# Patient Record
Sex: Male | Born: 1970
Health system: Southern US, Community
[De-identification: ages and names within clinical notes are randomized; demographics above are authoritative.]

## PROBLEM LIST (undated history)

## (undated) ENCOUNTER — Emergency Department (HOSPITAL_COMMUNITY): Discharge status: Against Medical Advice | Payer: Medicare Other

## (undated) DIAGNOSIS — J189 Pneumonia, unspecified organism: Secondary | ICD-10-CM

## (undated) DIAGNOSIS — L97519 Non-pressure chronic ulcer of other part of right foot with unspecified severity: Secondary | ICD-10-CM

## (undated) DIAGNOSIS — E119 Type 2 diabetes mellitus without complications: Secondary | ICD-10-CM

## (undated) DIAGNOSIS — G4730 Sleep apnea, unspecified: Secondary | ICD-10-CM

## (undated) DIAGNOSIS — I82409 Acute embolism and thrombosis of unspecified deep veins of unspecified lower extremity: Secondary | ICD-10-CM

## (undated) DIAGNOSIS — I10 Essential (primary) hypertension: Secondary | ICD-10-CM

## (undated) DIAGNOSIS — F419 Anxiety disorder, unspecified: Secondary | ICD-10-CM

## (undated) DIAGNOSIS — K219 Gastro-esophageal reflux disease without esophagitis: Secondary | ICD-10-CM

## (undated) DIAGNOSIS — F32A Depression, unspecified: Secondary | ICD-10-CM

## (undated) DIAGNOSIS — J42 Unspecified chronic bronchitis: Secondary | ICD-10-CM

## (undated) DIAGNOSIS — M1990 Unspecified osteoarthritis, unspecified site: Secondary | ICD-10-CM

## (undated) DIAGNOSIS — I2510 Atherosclerotic heart disease of native coronary artery without angina pectoris: Secondary | ICD-10-CM

## (undated) DIAGNOSIS — I2699 Other pulmonary embolism without acute cor pulmonale: Secondary | ICD-10-CM

## (undated) DIAGNOSIS — E785 Hyperlipidemia, unspecified: Secondary | ICD-10-CM

## (undated) DIAGNOSIS — G629 Polyneuropathy, unspecified: Secondary | ICD-10-CM

## (undated) DIAGNOSIS — G43909 Migraine, unspecified, not intractable, without status migrainosus: Secondary | ICD-10-CM

## (undated) DIAGNOSIS — J449 Chronic obstructive pulmonary disease, unspecified: Secondary | ICD-10-CM

## (undated) DIAGNOSIS — I219 Acute myocardial infarction, unspecified: Secondary | ICD-10-CM

## (undated) DIAGNOSIS — F329 Major depressive disorder, single episode, unspecified: Secondary | ICD-10-CM

## (undated) DIAGNOSIS — R51 Headache: Secondary | ICD-10-CM

## (undated) DIAGNOSIS — J45909 Unspecified asthma, uncomplicated: Secondary | ICD-10-CM

## (undated) HISTORY — DX: Hyperlipidemia, unspecified: E78.5

## (undated) HISTORY — DX: Essential (primary) hypertension: I10

## (undated) HISTORY — DX: Type 2 diabetes mellitus without complications: E11.9

## (undated) HISTORY — DX: Morbid (severe) obesity due to excess calories: E66.01

## (undated) HISTORY — DX: Atherosclerotic heart disease of native coronary artery without angina pectoris: I25.10

## (undated) HISTORY — PX: KNEE ARTHROSCOPY: SHX127

## (undated) HISTORY — DX: Polyneuropathy, unspecified: G62.9

## (undated) HISTORY — DX: Migraine, unspecified, not intractable, without status migrainosus: G43.909

## (undated) HISTORY — PX: CARPAL TUNNEL RELEASE: SHX101

## (undated) HISTORY — DX: Acute embolism and thrombosis of unspecified deep veins of unspecified lower extremity: I82.409

## (undated) HISTORY — PX: SHOULDER OPEN ROTATOR CUFF REPAIR: SHX2407

---

## 1994-12-20 DIAGNOSIS — I219 Acute myocardial infarction, unspecified: Secondary | ICD-10-CM

## 1994-12-20 HISTORY — DX: Acute myocardial infarction, unspecified: I21.9

## 1994-12-20 HISTORY — PX: CARDIAC CATHETERIZATION: SHX172

## 1999-12-16 ENCOUNTER — Emergency Department (HOSPITAL_COMMUNITY): Admission: EM | Admit: 1999-12-16 | Discharge: 1999-12-16 | Payer: Self-pay

## 2000-10-22 ENCOUNTER — Emergency Department (HOSPITAL_COMMUNITY): Admission: EM | Admit: 2000-10-22 | Discharge: 2000-10-22 | Payer: Self-pay

## 2001-03-20 ENCOUNTER — Emergency Department (HOSPITAL_COMMUNITY): Admission: EM | Admit: 2001-03-20 | Discharge: 2001-03-21 | Payer: Self-pay | Admitting: Emergency Medicine

## 2001-03-21 ENCOUNTER — Encounter: Payer: Self-pay | Admitting: Emergency Medicine

## 2001-04-03 ENCOUNTER — Emergency Department (HOSPITAL_COMMUNITY): Admission: EM | Admit: 2001-04-03 | Discharge: 2001-04-03 | Payer: Self-pay | Admitting: Internal Medicine

## 2001-04-12 ENCOUNTER — Encounter: Admission: RE | Admit: 2001-04-12 | Discharge: 2001-04-12 | Payer: Self-pay | Admitting: Internal Medicine

## 2001-04-13 ENCOUNTER — Ambulatory Visit (HOSPITAL_COMMUNITY): Admission: RE | Admit: 2001-04-13 | Discharge: 2001-04-13 | Payer: Self-pay | Admitting: Internal Medicine

## 2001-04-20 ENCOUNTER — Ambulatory Visit (HOSPITAL_COMMUNITY): Admission: RE | Admit: 2001-04-20 | Discharge: 2001-04-20 | Payer: Self-pay | Admitting: Internal Medicine

## 2001-04-24 ENCOUNTER — Encounter: Admission: RE | Admit: 2001-04-24 | Discharge: 2001-07-23 | Payer: Self-pay | Admitting: *Deleted

## 2001-10-20 ENCOUNTER — Encounter: Payer: Self-pay | Admitting: Emergency Medicine

## 2001-10-20 ENCOUNTER — Emergency Department (HOSPITAL_COMMUNITY): Admission: EM | Admit: 2001-10-20 | Discharge: 2001-10-20 | Payer: Self-pay | Admitting: Emergency Medicine

## 2003-01-29 ENCOUNTER — Emergency Department (HOSPITAL_COMMUNITY): Admission: EM | Admit: 2003-01-29 | Discharge: 2003-01-29 | Payer: Self-pay | Admitting: Emergency Medicine

## 2003-01-29 ENCOUNTER — Encounter: Payer: Self-pay | Admitting: Emergency Medicine

## 2003-05-13 ENCOUNTER — Emergency Department (HOSPITAL_COMMUNITY): Admission: EM | Admit: 2003-05-13 | Discharge: 2003-05-13 | Payer: Self-pay | Admitting: Emergency Medicine

## 2003-05-13 ENCOUNTER — Encounter: Payer: Self-pay | Admitting: Emergency Medicine

## 2003-05-17 ENCOUNTER — Emergency Department (HOSPITAL_COMMUNITY): Admission: EM | Admit: 2003-05-17 | Discharge: 2003-05-18 | Payer: Self-pay | Admitting: *Deleted

## 2003-05-18 ENCOUNTER — Inpatient Hospital Stay (HOSPITAL_COMMUNITY): Admission: EM | Admit: 2003-05-18 | Discharge: 2003-05-21 | Payer: Self-pay | Admitting: Psychiatry

## 2003-07-16 ENCOUNTER — Encounter: Payer: Self-pay | Admitting: Emergency Medicine

## 2003-07-16 ENCOUNTER — Emergency Department (HOSPITAL_COMMUNITY): Admission: EM | Admit: 2003-07-16 | Discharge: 2003-07-17 | Payer: Self-pay | Admitting: Emergency Medicine

## 2003-09-04 ENCOUNTER — Emergency Department (HOSPITAL_COMMUNITY): Admission: EM | Admit: 2003-09-04 | Discharge: 2003-09-05 | Payer: Self-pay | Admitting: Emergency Medicine

## 2003-10-08 ENCOUNTER — Emergency Department (HOSPITAL_COMMUNITY): Admission: EM | Admit: 2003-10-08 | Discharge: 2003-10-08 | Payer: Self-pay | Admitting: Emergency Medicine

## 2003-10-23 ENCOUNTER — Emergency Department (HOSPITAL_COMMUNITY): Admission: EM | Admit: 2003-10-23 | Discharge: 2003-10-23 | Payer: Self-pay | Admitting: Emergency Medicine

## 2003-12-05 ENCOUNTER — Emergency Department (HOSPITAL_COMMUNITY): Admission: AD | Admit: 2003-12-05 | Discharge: 2003-12-05 | Payer: Self-pay | Admitting: Family Medicine

## 2004-01-16 ENCOUNTER — Emergency Department (HOSPITAL_COMMUNITY): Admission: EM | Admit: 2004-01-16 | Discharge: 2004-01-16 | Payer: Self-pay | Admitting: Emergency Medicine

## 2004-05-29 ENCOUNTER — Emergency Department (HOSPITAL_COMMUNITY): Admission: EM | Admit: 2004-05-29 | Discharge: 2004-05-29 | Payer: Self-pay | Admitting: Family Medicine

## 2004-08-24 ENCOUNTER — Emergency Department (HOSPITAL_COMMUNITY): Admission: EM | Admit: 2004-08-24 | Discharge: 2004-08-24 | Payer: Self-pay | Admitting: Emergency Medicine

## 2004-10-12 ENCOUNTER — Ambulatory Visit: Payer: Self-pay | Admitting: Internal Medicine

## 2004-10-13 ENCOUNTER — Ambulatory Visit: Payer: Self-pay | Admitting: *Deleted

## 2004-12-14 ENCOUNTER — Emergency Department (HOSPITAL_COMMUNITY): Admission: EM | Admit: 2004-12-14 | Discharge: 2004-12-14 | Payer: Self-pay | Admitting: Family Medicine

## 2004-12-20 HISTORY — PX: CARDIAC CATHETERIZATION: SHX172

## 2005-04-07 ENCOUNTER — Emergency Department (HOSPITAL_COMMUNITY): Admission: EM | Admit: 2005-04-07 | Discharge: 2005-04-07 | Payer: Self-pay | Admitting: Emergency Medicine

## 2005-04-07 ENCOUNTER — Ambulatory Visit (HOSPITAL_COMMUNITY): Admission: RE | Admit: 2005-04-07 | Discharge: 2005-04-07 | Payer: Self-pay | Admitting: Emergency Medicine

## 2005-08-19 ENCOUNTER — Ambulatory Visit (HOSPITAL_COMMUNITY): Admission: RE | Admit: 2005-08-19 | Discharge: 2005-08-19 | Payer: Self-pay | Admitting: Emergency Medicine

## 2005-08-20 ENCOUNTER — Ambulatory Visit: Payer: Self-pay | Admitting: Internal Medicine

## 2005-08-25 ENCOUNTER — Encounter (HOSPITAL_COMMUNITY): Admission: RE | Admit: 2005-08-25 | Discharge: 2005-11-23 | Payer: Self-pay | Admitting: Internal Medicine

## 2005-08-25 ENCOUNTER — Ambulatory Visit: Payer: Self-pay | Admitting: Cardiology

## 2005-08-31 ENCOUNTER — Ambulatory Visit: Payer: Self-pay | Admitting: Internal Medicine

## 2005-09-01 ENCOUNTER — Ambulatory Visit (HOSPITAL_COMMUNITY): Admission: RE | Admit: 2005-09-01 | Discharge: 2005-09-01 | Payer: Self-pay | Admitting: Cardiology

## 2005-10-18 ENCOUNTER — Emergency Department (HOSPITAL_COMMUNITY): Admission: EM | Admit: 2005-10-18 | Discharge: 2005-10-19 | Payer: Self-pay | Admitting: Emergency Medicine

## 2005-10-26 ENCOUNTER — Emergency Department (HOSPITAL_COMMUNITY): Admission: EM | Admit: 2005-10-26 | Discharge: 2005-10-27 | Payer: Self-pay | Admitting: *Deleted

## 2005-12-20 ENCOUNTER — Emergency Department (HOSPITAL_COMMUNITY): Admission: EM | Admit: 2005-12-20 | Discharge: 2005-12-20 | Payer: Self-pay | Admitting: Emergency Medicine

## 2006-11-08 ENCOUNTER — Emergency Department (HOSPITAL_COMMUNITY): Admission: EM | Admit: 2006-11-08 | Discharge: 2006-11-08 | Payer: Self-pay | Admitting: Family Medicine

## 2007-05-07 ENCOUNTER — Emergency Department (HOSPITAL_COMMUNITY): Admission: EM | Admit: 2007-05-07 | Discharge: 2007-05-08 | Payer: Self-pay | Admitting: Emergency Medicine

## 2007-07-20 ENCOUNTER — Emergency Department (HOSPITAL_COMMUNITY): Admission: EM | Admit: 2007-07-20 | Discharge: 2007-07-21 | Payer: Self-pay | Admitting: Emergency Medicine

## 2007-07-21 ENCOUNTER — Ambulatory Visit (HOSPITAL_COMMUNITY): Admission: RE | Admit: 2007-07-21 | Discharge: 2007-07-21 | Payer: Self-pay | Admitting: Emergency Medicine

## 2007-07-21 ENCOUNTER — Ambulatory Visit: Payer: Self-pay | Admitting: Vascular Surgery

## 2007-08-05 ENCOUNTER — Encounter: Admission: RE | Admit: 2007-08-05 | Discharge: 2007-08-05 | Payer: Self-pay | Admitting: Sports Medicine

## 2007-09-08 ENCOUNTER — Ambulatory Visit (HOSPITAL_COMMUNITY): Admission: RE | Admit: 2007-09-08 | Discharge: 2007-09-08 | Payer: Self-pay | Admitting: Orthopaedic Surgery

## 2007-10-31 ENCOUNTER — Ambulatory Visit (HOSPITAL_COMMUNITY): Admission: RE | Admit: 2007-10-31 | Discharge: 2007-10-31 | Payer: Self-pay | Admitting: Orthopaedic Surgery

## 2007-11-29 ENCOUNTER — Encounter: Admission: RE | Admit: 2007-11-29 | Discharge: 2007-11-29 | Payer: Self-pay | Admitting: Orthopaedic Surgery

## 2008-01-16 ENCOUNTER — Encounter: Payer: Self-pay | Admitting: Endocrinology

## 2008-05-31 ENCOUNTER — Encounter: Payer: Self-pay | Admitting: Endocrinology

## 2008-07-28 ENCOUNTER — Emergency Department (HOSPITAL_COMMUNITY): Admission: EM | Admit: 2008-07-28 | Discharge: 2008-07-28 | Payer: Self-pay | Admitting: *Deleted

## 2008-08-23 ENCOUNTER — Encounter: Payer: Self-pay | Admitting: Endocrinology

## 2008-09-03 ENCOUNTER — Encounter: Payer: Self-pay | Admitting: Endocrinology

## 2008-09-09 DIAGNOSIS — E785 Hyperlipidemia, unspecified: Secondary | ICD-10-CM | POA: Insufficient documentation

## 2008-09-09 DIAGNOSIS — I10 Essential (primary) hypertension: Secondary | ICD-10-CM

## 2008-09-10 ENCOUNTER — Ambulatory Visit: Payer: Self-pay | Admitting: Endocrinology

## 2008-09-10 DIAGNOSIS — Z9861 Coronary angioplasty status: Secondary | ICD-10-CM

## 2008-09-10 DIAGNOSIS — I2510 Atherosclerotic heart disease of native coronary artery without angina pectoris: Secondary | ICD-10-CM | POA: Insufficient documentation

## 2008-09-16 ENCOUNTER — Ambulatory Visit (HOSPITAL_COMMUNITY): Admission: RE | Admit: 2008-09-16 | Discharge: 2008-09-16 | Payer: Self-pay | Admitting: Internal Medicine

## 2009-05-21 ENCOUNTER — Emergency Department (HOSPITAL_COMMUNITY): Admission: EM | Admit: 2009-05-21 | Discharge: 2009-05-22 | Payer: Self-pay | Admitting: Emergency Medicine

## 2009-11-10 ENCOUNTER — Emergency Department: Payer: Self-pay | Admitting: Emergency Medicine

## 2010-11-23 ENCOUNTER — Ambulatory Visit: Payer: Self-pay | Admitting: Internal Medicine

## 2010-11-30 ENCOUNTER — Telehealth (INDEPENDENT_AMBULATORY_CARE_PROVIDER_SITE_OTHER): Payer: Self-pay

## 2010-11-30 ENCOUNTER — Encounter: Payer: Self-pay | Admitting: Endocrinology

## 2010-12-01 ENCOUNTER — Ambulatory Visit: Payer: Self-pay

## 2010-12-01 ENCOUNTER — Encounter (HOSPITAL_COMMUNITY): Admission: RE | Admit: 2010-12-01 | Payer: Self-pay | Source: Home / Self Care | Admitting: Internal Medicine

## 2010-12-22 ENCOUNTER — Telehealth (INDEPENDENT_AMBULATORY_CARE_PROVIDER_SITE_OTHER): Payer: Self-pay | Admitting: *Deleted

## 2010-12-23 ENCOUNTER — Encounter (HOSPITAL_COMMUNITY): Admission: RE | Admit: 2010-12-23 | Payer: Self-pay | Source: Home / Self Care | Admitting: Internal Medicine

## 2011-01-10 ENCOUNTER — Encounter: Payer: Self-pay | Admitting: Internal Medicine

## 2011-01-10 ENCOUNTER — Encounter: Payer: Self-pay | Admitting: Emergency Medicine

## 2011-01-15 ENCOUNTER — Telehealth: Payer: Self-pay | Admitting: Internal Medicine

## 2011-01-19 NOTE — Assessment & Plan Note (Signed)
Summary: NEW ENDO-DIABETES MANAGEMENT/???INSULIN PUMP-$50-PKG-MEDICAID.Marland KitchenMarland Kitchen   Vital Signs:  Patient Profile:   40 Years Old Male Weight:      382.4 pounds Temp:     99.0 degrees F oral Pulse rate:   96 / minute BP sitting:   142 / 84  (right arm) Cuff size:   large  Pt. in pain?   no  Vitals Entered By: Orlan Leavens (September 10, 2008 11:12 AM)                  Referred by:  Michel Bickers, PA PCP:  [facility]  Chief Complaint:  NEW ENDO/ DIABETES.  History of Present Illness: pt states dm x 12 years, complicated by peripheral neuropathy.  he has been on insulin x 1 year.  he takes laqntus 50 units once daily, and novolog as needed (total approx 45 units/day).  cbg varies 200-600.  it is generally higher later in the day. his diet is good, and exercise is limited by his medical conditions. symptomatically, has few years of severe numbness of the legs, and associated burning-quality pain.     Current Allergies: No known allergies   Past Medical History:    Reviewed history from 09/09/2008 and no changes required:       CAD (ICD-414.00)       HYPERTENSION (ICD-401.9)       HYPERLIPIDEMIA (ICD-272.4)       DIABETES MELLITUS, TYPE I (ICD-250.01)   Family History:    Reviewed history and no changes required:       dm: brother  Social History:    Reviewed history and no changes required:       married       disabled   Risk Factors:  Tobacco use:  current   Review of Systems  The patient denies vomiting and fever.         denies blurry vision, headache, sob, urinary frequency, memory loss, hypoglycemia, bruising  has weight gain and loss.  has 1 month of headache.  has intermittent chest pain x 1 year.  also has muscle cramps and excessive diaphoresis.  has anxiety and depression.   Physical Exam  General:     obese.  this limits exam Head:     head is normocephalic eyes: no scleral icterus no periorbital swelling perrl external ears are  normal nose normal externally mouth has no lesion, including normal tongue  Neck:     no masses, thyromegaly, or abnormal cervical nodes Lungs:     clear to auscultation.  no respiratory distress  Heart:     regular rate and rhythm, S1, S2 without murmurs, rubs, gallops, or clicks Abdomen:     abdomen is soft, nontender.  no hepatosplenomegaly.   not distended.  no hernia  Msk:     no deformity or scoliosis noted with normal posture.  gait is steady with a cane Pulses:     dorsalis pedis intact bilat.  no carotid bruit  Extremities:     no deformity.  no ulcer on the feet.  feet are of normal color and temp.  1+ left pedal edema and 1+ right pedal edema.   Neurologic:     cn 2-12 grossly intact.   readily moves all 4's.   sensation is absent distal to the knees Skin:     normal texture and temp.  no rash.  not diaphoretic  Cervical Nodes:     no significant adenopathy Psych:  alert and cooperative; normal mood and affect; normal attention span and concentration Additional Exam:     outside test results are reviewed:  04/11/07: creat=0.6 ldl=137 hdl=40 tg=206 a1c=8.8  09/12/07: a1c=13.1  08/23/08: a1c=9.4  ecg today: results noted    Impression & Recommendations:  Problem # 1:  DIABETES MELLITUS, TYPE I (ICD-250.01)  Problem # 2:  numbness with painful component, prob due to dm  Problem # 3:  CAD (ICD-414.00) this limits rx of her dm  Medications Added to Medication List This Visit: 1)  Lantus Solostar 100 Unit/ml Soln (Insulin glargine) .... Take 50 units qhs 2)  Novolog Flexpen 100 Unit/ml Soln (Insulin aspart) .... Sliding scale 3)  Percocet 10-650 Mg Tabs (Oxycodone-acetaminophen) .... Take 1 by mouth qd 4)  Promethazine Hcl 25 Mg Tabs (Promethazine hcl) .... Take 1-2 by mouth once daily prn   Patient Instructions: 1)  we discussed the importance of diet and exercise therapy and the risks of diabetes. 2)  i told pt we will need to take this  complex situation in stages 3)  same lantus (50/day) 4)  increase novolog to a scheduled amount, 30 units just before each meal 5)  adenosine myoview 6)  ret 14d   ]

## 2011-01-19 NOTE — Assessment & Plan Note (Signed)
Summary: ec6/ dx: chest-tightness, left arm pain. gd   Visit Type:  Initial Consult Referring Provider:  Michel Bickers, PA Primary Provider:  [facility]  CC:  chest pain / SOB / headaches.  History of Present Illness: patient is a 40 year old who was referred for evaluation of chest pain.  He has a history of CP, diabetets, hypertension, dyslipidemia..  I saw the patient in 2006.  with his riisk factors I recommended cardiac cath.  This was done and was normal.  I did not see him after that He was referred  for evaluation of cehst pain.  He describes it as a tightness.  Occurs with and without activity.  [place] with stress.  Occurs 2 to 3 x per day.  He says with stress it is difficult to swallow food, that he will cough it up.     Preventive Screening-Counseling & Management  Alcohol-Tobacco     Smoking Status: current  Caffeine-Diet-Exercise     Does Patient Exercise: no      Drug Use:  yes.    Current Medications (verified): 1)  Lantus Solostar 100 Unit/ml Soln (Insulin Glargine) .... Take 50 Units Qhs 2)  Novolog Flexpen 100 Unit/ml Soln (Insulin Aspart) .... Sliding Scale 3)  Ipratropium-Albuterol 0.5-2.5 (3) Mg/28ml Soln (Ipratropium-Albuterol) .... Use Prn 4)  Metformin Hcl 1000 Mg Tabs (Metformin Hcl) .... Take 1 By Mouth Two Times A Day 5)  Bd Insulin Syringe 27g X 1/2" 1 Ml Misc (Insulin Syringe-Needle U-100) .... Use As Directed 6)  Bd Insulin Syringe Ultrafine 31g X 5/16" 1 Ml Misc (Insulin Syringe-Needle U-100) .... Use As Directed 7)  Oxycodone Hcl 5 Mg Tabs (Oxycodone Hcl) .... 2 Tabs Every 4 Hrs As Needed 8)  Promethazine Hcl 25 Mg Tabs (Promethazine Hcl) .... Take 1-2 By Mouth Once Daily Prn 9)  Aspirin 81 Mg Tbec (Aspirin) .... Take One Tablet By Mouth Daily 10)  Fluconazole 200 Mg Tabs (Fluconazole) .... Weekly As Needed 11)  Alprazolam 1 Mg Tabs (Alprazolam) .... Two Times A Day As Needed 12)  Tricor 145 Mg Tabs (Fenofibrate) .... Take 1 Tablet By  Mouth Once A Day 13)  Bupropion Hcl 150 Mg Xr24h-Tab (Bupropion Hcl) .... Take 1 Tablet By Mouth Two Times A Day 14)  Ra Potassium Gluconate 595 Mg Tabs (Potassium Gluconate) .... Two Times A Day 15)  Proair Hfa 108 (90 Base) Mcg/act Aers (Albuterol Sulfate) .... As Needed 16)  Flovent Hfa 220 Mcg/act Aero (Fluticasone Propionate  Hfa) .... 2 Puffs Two Times A Day 17)  Diphenhydramine Hcl 25 Mg Tabs (Diphenhydramine Hcl) .... As Needed 18)  Ibuprofen 200 Mg Tabs (Ibuprofen) .... As Needed 19)  Icy Hot 7.5 % (Roll) Misc (Menthol (Topical Analgesic)) .... As Needed  Allergies (verified): No Known Drug Allergies  Past History:  Past Medical History: Last updated: 09/10/2008 CAD (ICD-414.00) HYPERTENSION (ICD-401.9) HYPERLIPIDEMIA (ICD-272.4) DIABETES MELLITUS, TYPE I (ICD-250.01)  Family History: Last updated: 09/10/2008 dm: brother  Social History: Last updated: 11/23/2010 married disabled Tobacco Use - Yes.  Down to 1/2 ppd Alcohol Use - yes once in awhile Regular Exercise - no Drug Use - yes (cocaine last used in 1995)  Past Surgical History: Knee Arthroplasty-Total Carpel Tunnel  Social History: married disabled Tobacco Use - Yes.  Down to 1/2 ppd Alcohol Use - yes once in awhile Regular Exercise - no Drug Use - yes (cocaine last used in 1995) Does Patient Exercise:  no Drug Use:  yes  Review  of Systems       All systems reviewed.  Neg to the above problem except as noted above.  Vital Signs:  Patient profile:   40 year old male Height:      75 inches Weight:      357 pounds BMI:     44.78 Pulse rate:   107 / minute BP sitting:   112 / 72  (left arm) Cuff size:   large  Vitals Entered By: Hardin Negus, RMA (November 23, 2010 4:24 PM)  Physical Exam  Additional Exam:  Patient is in NAD HEENT:  Normocephalic, atraumatic. EOMI, PERRLA.  Neck: JVP is normal. No thyromegaly. No bruits.  Lungs: clear to auscultation. No rales no wheezes.  Heart:  Regular rate and rhythm. Normal S1, S2. No S3.   No significant murmurs. PMI not displaced.  Abdomen:  Supple, nontenderm obese. Normal bowel sounds. No masses. No hepatomegaly.  Extremities:   Good distal pulses throughout. No lower extremity edema.  Musculoskeletal :moving all extremities.  Neuro:   alert and oriented x3.    EKG  Procedure date:  11/23/2010  Findings:      Sinus tachycardia.  107 bpm.    Inf wall MI   Impression & Recommendations:  Problem # 1:  CHEST PAIN-UNSPECIFIED (ICD-786.50) Patient with multiple risks for CAD.  I am not completely convinced his sypmtoms are cardiac in origin.  Part sounds GI with possible GI spasm. I would recomm scheduling him for a Dobutamine myoview. I have also recomm a trial of amlodipine for spasm.  I would also put him on an acid inhibitor. His updated medication list for this problem includes:    Aspirin 81 Mg Tbec (Aspirin) .Marland Kitchen... Take one tablet by mouth daily    Amlodipine Besylate 2.5 Mg Tabs (Amlodipine besylate) ..... One half a tab every day  Problem # 2:  HYPERTENSION (ICD-401.9) BP is adequate.  Should tolerate low dose amlodipine.  Problem # 3:  HYPERLIPIDEMIA (ICD-272.4) On tricor now.  Needs a statin.  Will wait until lipitor genieric. His updated medication list for this problem includes:    Tricor 145 Mg Tabs (Fenofibrate) .Marland Kitchen... Take 1 tablet by mouth once a day  Other Orders: EKG w/ Interpretation (93000) Nuclear Stress Test (Nuc Stress Test)  Patient Instructions: 1)  Your physician has requested that you have a dobutamine myoview.  For further information please visit [email or web address]  Please follow instruction sheet, as given. 2)  Your physician recommends that you schedule a follow-up appointment in: 2 months with Dr.Eoin Willden Prescriptions: OMEPRAZOLE 40 MG CPDR (OMEPRAZOLE) 1 cap every day prior to breakfast  #30 x 6   Entered by:   Layne Benton, RN, BSN   Authorized by:   Sherrill Raring, MD,  [facility]   Signed by:   Layne Benton, RN, BSN on 11/23/2010   Method used:   [facility] to        [facility]  [address] [id number]       [address]       [place], [name]  [address]       Ph: [phone]       Fax: [phone]   RxID:   [id number] AMLODIPINE BESYLATE 2.5 MG TABS (AMLODIPINE BESYLATE) one half a tab every day  #30 x 3   Entered by:   Layne Benton, RN, BSN   Authorized by:   Sherrill Raring, MD, [facility]  Signed by:   Layne Benton, RN, BSN on 11/23/2010   Method used:   [facility]        [phone][facility] (retail)       [address]       [place], [name]  [address]       Ph: [phone]       Fax: [phone]   RxID:   [id number]

## 2011-01-21 ENCOUNTER — Telehealth (INDEPENDENT_AMBULATORY_CARE_PROVIDER_SITE_OTHER): Payer: Self-pay | Admitting: *Deleted

## 2011-01-21 NOTE — Progress Notes (Signed)
Summary: Question about medication  Phone Note Call from Patient Call back at Home Phone [phone]   Caller: Patient Summary of Call: Calling regarding medication Amlodipine 2.5mg  Initial call taken by: Judie Grieve,  January 15, 2011 1:30 PM  Follow-up for Phone Call        Called patient back. He advised me that he could not split the Norvasc 2.5 mg because the pill is too small so he has been taking the medication every other day. Discussed above with Dr.Ross and she advised that he take Norvasc 2.5mg  every day. Patient is aware of this and requesting that we send new script to [facility].  Layne Benton, RN, BSN  January 15, 2011 6:02 PM      New/Updated Medications: AMLODIPINE BESYLATE 2.5 MG TABS (AMLODIPINE BESYLATE) one tab every day Prescriptions: AMLODIPINE BESYLATE 2.5 MG TABS (AMLODIPINE BESYLATE) one tab every day  #30 x 6   Entered by:   Layne Benton, RN, BSN   Authorized by:   Sherrill Raring, MD, [facility]   Signed by:   Layne Benton, RN, BSN on 01/15/2011   Method used:   Electronically to        [facility]* (retail)       [address]       [place], [name]  [address]       Ph: [phone]       Fax: [phone]   RxID:   [id number]

## 2011-01-21 NOTE — Progress Notes (Signed)
Summary: Nuclear R/S  Phone Note Outgoing Call Call back at [facility] Phone [phone]   Call placed by: Stanton Kidney, EMT-P,  December 22, 2010 1:49 PM Summary of Call: His wife advised he would call us back ref: stress test instructions. Stanton Kidney, EMT-P  December 22, 2010 1:49 PM  Pt called back, rescheduled to 01/25/11, d/t illness in family. Stanton Kidney, EMT-P  December 22, 2010 2:09 PM

## 2011-01-21 NOTE — Miscellaneous (Signed)
Summary: Appointment Canceled  Appointment status changed to canceled by [employer] on 11/30/2010 2:31 PM.  Cancellation Comments --------------------- WT 357/DOUB. S/O/414.01/ross/MCR/NO PREC. REQ/SAF  Appointment Information ----------------------- Appt Type:  CARDIOLOGY NUCLEAR TESTING      Date:  Tuesday, December 01, 2010      Time:  12:30 PM for 15 min   Urgency:  Routine   Made By:  Hoy Finlay Scheduler  To Visit:  [id number]    Reason:  WT 357/DOUB. S/O/414.01/ross/MCR/NO PREC. REQ/SAF  Appt Comments ------------- -- 11/30/10 14:31: ([facility]) CANCELED -- WT 357/DOUB. S/O/414.01/ross/MCR/NO PREC. REQ/SAF -- 11/23/10 17:26: ([facility]) BOOKED -- Routine CARDIOLOGY NUCLEAR TESTING at 12/01/2010 12:30 PM for 15 min WT 357/DOUB. S/O/414.01/ross/MCR/NO PREC. REQ/SAF -- 12/5/

## 2011-01-21 NOTE — Progress Notes (Signed)
Summary: Nuc. Pre-Procedure  Phone Note Outgoing Call Call back at [facility] Phone [phone]   Call placed by: Irean Hong, RN,  November 30, 2010 1:56 PM Summary of Call: Reviewed information on Myoview Information Sheet (see scanned document for further details).  Spoke with patient's wife.     Nuclear Med Background Indications for Stress Test: Evaluation for Ischemia   History: Heart Catheterization, Myocardial Infarction, Myocardial Perfusion Study  History Comments: Hx.MI in past per Dictated note (05-22-09).  '06 Cath: NL coronaries, EF=60%. '06 MPS: EF=47% ? DCM, No abnormal areas of reversibility [facility]).  Symptoms: Chest Tightness, Chest Tightness with Exertion    Nuclear Pre-Procedure Cardiac Risk Factors: Family History - CAD, Hypertension, IDDM Type 1, Lipids, Obesity, Smoker Height (in): 75

## 2011-01-25 ENCOUNTER — Encounter (HOSPITAL_COMMUNITY): Payer: Medicare Other

## 2011-01-25 ENCOUNTER — Telehealth (INDEPENDENT_AMBULATORY_CARE_PROVIDER_SITE_OTHER): Payer: Self-pay | Admitting: *Deleted

## 2011-01-27 NOTE — Progress Notes (Signed)
Summary: Nuclear Pre-Procedure  Phone Note Outgoing Call Call back at [facility] Phone [phone]   Call placed by: Stanton Kidney, EMT-P,  January 21, 2011 1:37 PM Action Taken: Phone Call Completed Summary of Call: Left message with information on Myoview Information Sheet (see scanned document for details). Stanton Kidney, EMT-P  January 21, 2011 1:37 PM     Nuclear Med Background Indications for Stress Test: Evaluation for Ischemia   History: Heart Catheterization, Myocardial Infarction, Myocardial Perfusion Study  History Comments: Hx.MI in past per Dictated note (05-22-09).  '06 Cath: NL coronaries, EF=60%. '06 MPS: EF=47% ? DCM, No abnormal areas of reversibility [facility]).  Symptoms: Chest Tightness, Chest Tightness with Exertion    Nuclear Pre-Procedure Cardiac Risk Factors: Family History - CAD, Hypertension, IDDM Type 1, Lipids, Obesity, Smoker Height (in): 75

## 2011-02-04 ENCOUNTER — Telehealth (INDEPENDENT_AMBULATORY_CARE_PROVIDER_SITE_OTHER): Payer: Self-pay | Admitting: Radiology

## 2011-02-04 NOTE — Progress Notes (Addendum)
Summary: No show for myoview  Phone Note Outgoing Call Call back at [facility] Phone [phone]   Summary of Call: Patient no showed.  Arline Asp called patient at 12:45 pm and left message.  Patient called Lela back at 4:45 pm to reschedule due to "car trouble".  This was his third r/s.  Told her to tell him this was his last time to r/s.     Appended Document: No show for myoview patient also on trial of Norvasc and omeprazole No show for Myoview. Rec:  Need f/u on symptoms. Cut omeprazole to 20.  Appended Document: No show for [facility] Appointment 2/24 with Tenny Craw.

## 2011-02-08 ENCOUNTER — Telehealth (INDEPENDENT_AMBULATORY_CARE_PROVIDER_SITE_OTHER): Payer: Self-pay

## 2011-02-08 ENCOUNTER — Encounter (HOSPITAL_COMMUNITY): Payer: Medicare Other

## 2011-02-10 NOTE — Progress Notes (Signed)
Summary: Nuclear Pre-Procedure  Phone Note Outgoing Call Call back at [facility] Phone [phone]   Call placed by: Stanton Kidney, EMT-P,  February 04, 2011 2:55 PM Call placed to: Patient Action Taken: Phone Call Completed Reason for Call: Confirm/change Appt Summary of Call: Left message with information on Myoview Information Sheet (see scanned document for details). Stanton Kidney, EMT-P  February 04, 2011 2:55 PM      Nuclear Med Background Indications for Stress Test: Evaluation for Ischemia   History: Heart Catheterization, Myocardial Infarction, Myocardial Perfusion Study  History Comments: Hx.MI in past per Dictated note (05-22-09).  '06 Cath: NL coronaries, EF=60%. '06 MPS: EF=47% ? DCM, No abnormal areas of reversibility [facility]).  Symptoms: Chest Tightness, Chest Tightness with Exertion    Nuclear Pre-Procedure Cardiac Risk Factors: Family History - CAD, Hypertension, IDDM Type 1, Lipids, Obesity, Smoker Height (in): 75

## 2011-02-12 ENCOUNTER — Ambulatory Visit: Payer: Self-pay | Admitting: Internal Medicine

## 2011-02-16 NOTE — Progress Notes (Signed)
Summary: Reschedule Myoview  Phone Note Outgoing Call Call back at Home Phone [phone]   Call placed by: Irean Hong, RN,  February 08, 2011 2:13 PM Summary of Call: The patient called in today, and spoke with the scheduler to cancel the myoview today due to bad weather and phone out of order earlier today. The scheduler rescheduled the myoview for 02/18/11 at 12:30 pm, and rescheduled the office visit with Dr. Tenny Craw for 03/05/11. The patient has rescheduled 3 times recently due to family emergencies per patient.The patient told  about the no show fee of $100.00 in future for no show if not related to weather and emergency.Zale [name],RN.

## 2011-02-17 ENCOUNTER — Telehealth (INDEPENDENT_AMBULATORY_CARE_PROVIDER_SITE_OTHER): Payer: Self-pay | Admitting: *Deleted

## 2011-02-18 ENCOUNTER — Encounter (HOSPITAL_COMMUNITY): Payer: Medicare Other

## 2011-02-25 NOTE — Progress Notes (Signed)
Summary: Reschedule Nuclear Study  Phone Note Outgoing Call   Call placed by: Milana Na, EMT-P,  February 17, 2011 4:06 PM Summary of Call: Reviewed information on Myoview Information Sheet (see scanned document for further details).  Spoke with patient.    The patient has rescheduled again for March 04, 2011. He states he was in the hospital 02/16/2011 for a foot problem. He advised he would be here for that. S.Williams EMTP

## 2011-03-02 ENCOUNTER — Encounter: Payer: Self-pay | Admitting: *Deleted

## 2011-03-04 ENCOUNTER — Encounter (HOSPITAL_COMMUNITY): Payer: Medicare Other

## 2011-03-05 ENCOUNTER — Ambulatory Visit: Payer: Self-pay | Admitting: Internal Medicine

## 2011-03-29 ENCOUNTER — Ambulatory Visit: Payer: Medicare Other | Admitting: Cardiology

## 2011-03-29 LAB — DIFFERENTIAL
Basophils Absolute: 0 10*3/uL (ref 0.0–0.1)
Basophils Relative: 0 % (ref 0–1)
Eosinophils Absolute: 0.2 10*3/uL (ref 0.0–0.7)
Eosinophils Relative: 2 % (ref 0–5)
Lymphocytes Relative: 25 % (ref 12–46)
Lymphs Abs: 3.2 10*3/uL (ref 0.7–4.0)
Monocytes Absolute: 0.9 10*3/uL (ref 0.1–1.0)
Monocytes Relative: 7 % (ref 3–12)
Neutro Abs: 8.3 10*3/uL — ABNORMAL HIGH (ref 1.7–7.7)
Neutrophils Relative %: 66 % (ref 43–77)

## 2011-03-29 LAB — CBC
HCT: 47 % (ref 39.0–52.0)
Hemoglobin: 16.1 g/dL (ref 13.0–17.0)
MCHC: 34.3 g/dL (ref 30.0–36.0)
MCV: 85.5 fL (ref 78.0–100.0)
Platelets: 229 10*3/uL (ref 150–400)
RBC: 5.5 MIL/uL (ref 4.22–5.81)
RDW: 12.8 % (ref 11.5–15.5)
WBC: 12.6 10*3/uL — ABNORMAL HIGH (ref 4.0–10.5)

## 2011-03-29 LAB — POCT I-STAT, CHEM 8
BUN: 8 mg/dL (ref 6–23)
BUN: 9 mg/dL (ref 6–23)
Calcium, Ion: 1.11 mmol/L — ABNORMAL LOW (ref 1.12–1.32)
Calcium, Ion: 1.16 mmol/L (ref 1.12–1.32)
Chloride: 103 mEq/L (ref 96–112)
Chloride: 103 mEq/L (ref 96–112)
Creatinine, Ser: 0.6 mg/dL (ref 0.4–1.5)
Creatinine, Ser: 0.7 mg/dL (ref 0.4–1.5)
Glucose, Bld: 276 mg/dL — ABNORMAL HIGH (ref 70–99)
Glucose, Bld: 388 mg/dL — ABNORMAL HIGH (ref 70–99)
HCT: 46 % (ref 39.0–52.0)
HCT: 49 % (ref 39.0–52.0)
Hemoglobin: 15.6 g/dL (ref 13.0–17.0)
Hemoglobin: 16.7 g/dL (ref 13.0–17.0)
Potassium: 3.9 mEq/L (ref 3.5–5.1)
Potassium: 4.2 mEq/L (ref 3.5–5.1)
Sodium: 136 mEq/L (ref 135–145)
Sodium: 139 mEq/L (ref 135–145)
TCO2: 19 mmol/L (ref 0–100)
TCO2: 22 mmol/L (ref 0–100)

## 2011-03-29 LAB — POCT CARDIAC MARKERS
CKMB, poc: 1.1 ng/mL (ref 1.0–8.0)
CKMB, poc: <1 ng/mL — ABNORMAL LOW (ref 1.0–8.0)
Myoglobin, poc: 53 ng/mL (ref 12–200)
Myoglobin, poc: 75.7 ng/mL (ref 12–200)
Troponin i, poc: <0.05 ng/mL (ref 0.00–0.09)
Troponin i, poc: <0.05 ng/mL (ref 0.00–0.09)

## 2011-04-27 ENCOUNTER — Ambulatory Visit: Payer: Medicare Other | Admitting: Cardiology

## 2011-05-04 NOTE — Op Note (Signed)
NAME:  Joseph [name], Joseph [name]               ACCOUNT NO.:  [id number]   MEDICAL RECORD NO.:  [id number]          PATIENT TYPE:  AMB   LOCATION:  SDS                          FACILITY:  [facility]   PHYSICIAN:  Claude Manges. Whitfield, M.D.DATE OF BIRTH:  1971/08/01   DATE OF PROCEDURE:  10/31/07  DATE OF DISCHARGE:  10/31/2007                               OPERATIVE REPORT   PREOPERATIVE DIAGNOSES:  Tear of medial meniscus, left knee; with  tricompartmental degenerative joint disease   POSTOPERATIVE DIAGNOSES:  Tear of medial meniscus, left knee; with  tricompartmental degenerative joint disease.  With tear of lateral  meniscus and partial tear of anterior cruciate ligament.   PROCEDURES:  1. Arthroscopic partial medial and lateral meniscectomy.  2. Microfracture of medial femoral condyle and trochlear.  3. Debridement of ACL.   SURGEON:  Claude Manges. Cleophas Dunker, M.D.   ANESTHESIA:  General orotracheal.   COMPLICATIONS:  None.   HISTORY:  A 40 year old gentleman who sustained a twisting injury to his  knee and has had subsequent sensation of his knee giving way and some  popping.  He has had an MRI scan revealing a severe osteoarthritis,  possibly a free-floating chondral lesion and a medial meniscal tear.  He  is well over 300 pounds in weight and has been recently treated for  bronchitis, but cleared by his family physician.   PROCEDURE:  With the patient comfortable on the operating room table and  under general orotracheal anesthesia, the left lower extremity was  placed in a thigh holder and the leg was then prepped with DuraPrep from  the thigh holder to the ankles.  Sterile draping was performed.   Xylocaine with epinephrine was injected for the arthroscopic portals on  either side of the patella tendon.  Small puncture sites were then made  with the #11 knife.  The arthroscope was placed in the lateral portal;  the medial portal was enlarged with the obturator.  There was no  effusion.   Diagnostic arthroscopy was somewhat difficult because of the patient's  size.  There was a moderate amount of synovitis in the superior pouch.  This was debrided with the ArthroCare wand.  I saw large areas of  chondromalacia of the patella, and even larger areas about the trochlea,  where there were large fragmented pieces of articular cartilage that  were loose with exposed subchondral bone.  These areas were then shaved  and tapered to prevent any free chondral fragments.  I then performed a  microfracture to the area, with a good bleeding bone.  The roughened  areas of the patella were shaved as well.   The lateral compartments were then evaluated with difficulty.  It was  difficult to evaluate because there was fraying of the lateral meniscus.  This was debrided with the ArthroCare wand and then the intra-articular  shaver, at which point I could further evaluate the articular cartilage.  There was some deep fraying of the cartilage of the femoral condyle, but  none on the tibial plateau.  There was considerable fraying of both the  anterior and posterior horns of  the lateral meniscus, which were  debrided.  There were no loose bodies.   The intercondylar notch revealed considerable fraying of the ACL, and I  think he has had a significant partial tear.  The frayed areas were  debrided with the ArthroCare wand.  There were still some areas of ACL  intact.   The medial compartment revealed a significant pathology.  There were  several tears of the posterior horn of the medial meniscus, which were  debrided; so that I had a nice stable rim.  I carefully probed without  evidence of further tearing.  There was a large area of a large  articular cartilage flap.  It was about to fall off, and this was  debrided.  Beneath that, there was an area of exposed subchondral bone,  that was probably the size of a dime.  The cartilage was tapered around  in its periphery, and then I performed a  microfracture with very nice  bleeding bone.  The joint was then explored with evidence of loose  material, with 2 stab wounds left open and infiltrated with 0.25%  Marcaine with epinephrine.  A sterile bulky dressing was applied,  followed by an Ace bandage.   PLAN:  Percocet for pain.  Office in one week.  Crutches.      Claude Manges. Cleophas Dunker, M.D.  Electronically Signed     PWW/MEDQ  D:  10/31/2007  T:  10/31/2007  Job:  [id number]

## 2011-05-07 NOTE — Cardiovascular Report (Signed)
NAME:  Joseph [name], KAUFHOLD               ACCOUNT NO.:  [id number]   MEDICAL RECORD NO.:  [id number]          PATIENT TYPE:  OIB   LOCATION:  2853                         FACILITY:  [facility]   PHYSICIAN:  Charlies Constable, M.D. 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 Cleta Alberts by Dr. Tenny Craw for  evaluation of chest pain.  He also has hypertension and hyperlipidemia,  sleep apnea, and tobacco use.  Dr. 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   PROCEDURE:  The procedure was performed via the right femoral artery using  arterial sheath and 6 French preformed coronary catheters.  A arterial  puncture was performed and Omnipaque contrast was used.  The right femoral  artery was closed with Angioseal at the end of the procedure.  The patient  tolerated the procedure well and left the laboratory in satisfactory  condition.   RESULTS:  The aortic pressure was 120/88 with a mean of 122.  Left  ventricular pressure was 120/15.   Left main coronary artery is free of significant disease.   Left anterior descending artery gave rise to four diagonal branches and two  septal perforators.  These and the LAD proper were free of significant  disease.   The circumflex artery gave rise to an atrial branch and two posterolateral  branches.  These vessels were free of significant disease.   The right coronary artery is a moderate size vessel and gave rise to a right  ventricular branch, posterior descending branch, and a posterolateral  branch.  These vessels were free of significant disease.   The left ventriculogram performed in the RAO  projection showed good wall  motion with no areas of hypokinesis.  The estimated ejection fraction was  60%.   CONCLUSION:  Normal coronary angiography and left ventricular wall motion.   RECOMMENDATIONS:  Reassurance.  I will plan to let the patient go home later  today.  Will arrange follow up with Dr. Tenny Craw and she indicated to me that  she will arrange for pulmonary consultation for further evaluation regarding  his pulmonary disease.           ______________________________  Charlies Constable, M.D. [facility]     BB/MEDQ  D:  09/01/2005  T:  09/01/2005  Job:  [id number]   cc:   Pricilla Riffle, M.D.  [address]  [address]  [place]  [name] [address]   [place]. Cleta Alberts, M.D.  [address]  [place]  [name] [address]  Fax: [phone]
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
## 2011-08-31 ENCOUNTER — Other Ambulatory Visit: Payer: Self-pay | Admitting: *Deleted

## 2011-08-31 MED ORDER — OMEPRAZOLE 40 MG PO CPDR: 40.0000 mg | DELAYED_RELEASE_CAPSULE | Freq: Every day | ORAL | Status: DC

## 2011-09-20 LAB — GLUCOSE, CAPILLARY: Glucose-Capillary: 208 — ABNORMAL HIGH

## 2011-09-28 LAB — URINALYSIS, ROUTINE W REFLEX MICROSCOPIC
Bilirubin Urine: NEGATIVE
Glucose, UA: NEGATIVE
Hgb urine dipstick: NEGATIVE
Ketones, ur: NEGATIVE
Nitrite: NEGATIVE
Protein, ur: NEGATIVE
Specific Gravity, Urine: 1.027
Urobilinogen, UA: 0.2
pH: 5.5

## 2011-09-28 LAB — COMPREHENSIVE METABOLIC PANEL
ALT: 26
AST: 28
Albumin: 3.9
Alkaline Phosphatase: 67
BUN: 11
CO2: 27
Calcium: 9.1
Chloride: 105
Creatinine, Ser: 0.54
GFR calc Af Amer: >60
GFR calc non Af Amer: >60
Glucose, Bld: 103 — ABNORMAL HIGH
Potassium: 4.4
Sodium: 139
Total Bilirubin: 0.7
Total Protein: 7

## 2011-09-28 LAB — CBC
HCT: 46.7
Hemoglobin: 15.8
MCHC: 33.9
MCV: 84.8
Platelets: 330
RBC: 5.51
RDW: 12.7
WBC: 17 — ABNORMAL HIGH

## 2011-09-28 LAB — APTT: aPTT: 31

## 2011-09-28 LAB — PROTIME-INR
INR: 0.9
Prothrombin Time: 12.7

## 2011-09-30 LAB — URINALYSIS, ROUTINE W REFLEX MICROSCOPIC
Bilirubin Urine: NEGATIVE
Glucose, UA: >1000 — AB
Hgb urine dipstick: NEGATIVE
Ketones, ur: NEGATIVE
Leukocytes, UA: NEGATIVE
Nitrite: NEGATIVE
Protein, ur: NEGATIVE
Specific Gravity, Urine: 1.04 — ABNORMAL HIGH
Urobilinogen, UA: 0.2
pH: 5.5

## 2011-09-30 LAB — CBC
HCT: 46.7
Hemoglobin: 15.9
MCHC: 34.1
MCV: 83.9
Platelets: 280
RBC: 5.57
RDW: 12.6
WBC: 16.2 — ABNORMAL HIGH

## 2011-09-30 LAB — COMPREHENSIVE METABOLIC PANEL
ALT: 34
AST: 25
Albumin: 3.6
Alkaline Phosphatase: 95
BUN: 8
CO2: 25
Calcium: 9.5
Chloride: 101
Creatinine, Ser: 0.64
GFR calc Af Amer: >60
GFR calc non Af Amer: >60
Glucose, Bld: 371 — ABNORMAL HIGH
Potassium: 4.5
Sodium: 136
Total Bilirubin: 0.7
Total Protein: 6.3

## 2011-09-30 LAB — URINE MICROSCOPIC-ADD ON

## 2011-09-30 LAB — PROTIME-INR
INR: 0.9
Prothrombin Time: 12.5

## 2011-09-30 LAB — APTT: aPTT: 29

## 2011-10-04 LAB — D-DIMER, QUANTITATIVE (NOT AT ARMC): D-Dimer, Quant: <0.22

## 2012-06-19 ENCOUNTER — Other Ambulatory Visit: Payer: Self-pay | Admitting: Internal Medicine

## 2012-06-19 ENCOUNTER — Other Ambulatory Visit: Payer: Self-pay | Admitting: Cardiology

## 2012-10-06 ENCOUNTER — Encounter (HOSPITAL_COMMUNITY): Payer: Self-pay | Admitting: *Deleted

## 2012-10-06 ENCOUNTER — Emergency Department (HOSPITAL_COMMUNITY)
Admission: EM | Admit: 2012-10-06 | Discharge: 2012-10-06 | Disposition: A | Discharge status: Home / Self Care | Payer: Medicare Other | Attending: Emergency Medicine | Admitting: Emergency Medicine

## 2012-10-06 DIAGNOSIS — Z794 Long term (current) use of insulin: Secondary | ICD-10-CM | POA: Insufficient documentation

## 2012-10-06 DIAGNOSIS — I2510 Atherosclerotic heart disease of native coronary artery without angina pectoris: Secondary | ICD-10-CM | POA: Insufficient documentation

## 2012-10-06 DIAGNOSIS — T169XXA Foreign body in ear, unspecified ear, initial encounter: Secondary | ICD-10-CM

## 2012-10-06 DIAGNOSIS — E785 Hyperlipidemia, unspecified: Secondary | ICD-10-CM | POA: Insufficient documentation

## 2012-10-06 DIAGNOSIS — I10 Essential (primary) hypertension: Secondary | ICD-10-CM | POA: Insufficient documentation

## 2012-10-06 DIAGNOSIS — IMO0002 Reserved for concepts with insufficient information to code with codable children: Secondary | ICD-10-CM | POA: Insufficient documentation

## 2012-10-06 DIAGNOSIS — E119 Type 2 diabetes mellitus without complications: Secondary | ICD-10-CM | POA: Insufficient documentation

## 2012-10-06 DIAGNOSIS — F17200 Nicotine dependence, unspecified, uncomplicated: Secondary | ICD-10-CM | POA: Insufficient documentation

## 2012-10-06 DIAGNOSIS — Z79899 Other long term (current) drug therapy: Secondary | ICD-10-CM | POA: Insufficient documentation

## 2012-10-06 MED ORDER — LIDOCAINE VISCOUS 2 % MT SOLN
5.0000 mL | Freq: Once | OROMUCOSAL | Status: AC
  Administered 2012-10-06: 5 mL via OROMUCOSAL

## 2012-10-06 MED ORDER — NEOMYCIN-POLYMYXIN-HC 3.5-10000-1 OT SUSP: 4.0000 [drp] | Freq: Four times a day (QID) | OTIC | Status: DC

## 2012-10-06 MED ORDER — LIDOCAINE VISCOUS 2 % MT SOLN
OROMUCOSAL | Status: AC
  Filled 2012-10-06: qty 15

## 2012-10-06 NOTE — ED Provider Notes (Signed)
History     CSN: [id number]  Arrival date & time 10/06/12  0309   First MD Initiated Contact with Patient 10/06/12 0340      Chief Complaint  Patient presents with  . Foreign Body in Ear    (Consider location/radiation/quality/duration/timing/severity/associated sxs/prior treatment) HPI Comments: Joseph [name] is a 41 y.o. Male who felt a bug fly into his ear. He has pain in the left ear. There are no other complaints. There are no modifying factors.  Patient is a 41 y.o. male presenting with foreign body in ear. The history is provided by the patient.  Foreign Body in Ear    Past Medical History  Diagnosis Date  . CAD (coronary artery disease)   . Hypertension   . Hyperlipidemia   . Diabetes mellitus     Past Surgical History  Procedure Date  . Knee arthoplasty   . Carpel tunnel     History reviewed. No pertinent family history.  History  Substance Use Topics  . Smoking status: Current Every Day Smoker -- 1.5 packs/day  . Smokeless tobacco: Not on file  . Alcohol Use: Yes      Review of Systems  All other systems reviewed and are negative.    Allergies  Sulfa antibiotics  Home Medications   Current Outpatient Rx  Name Route Sig Dispense Refill  . ALBUTEROL SULFATE HFA 108 (90 BASE) MCG/ACT IN AERS Inhalation Inhale 2 puffs into the lungs every 6 (six) hours as needed.      . ALPRAZOLAM 1 MG PO TABS Oral Take 1 mg by mouth 2 (two) times daily.      Marland Kitchen AMLODIPINE BESYLATE 2.5 MG PO TABS Oral Take 2.5 mg by mouth daily.      . ASPIRIN 81 MG PO TABS Oral Take 81 mg by mouth daily.      . BUPROPION HCL ER (XL) 150 MG PO TB24 Oral Take 150 mg by mouth 2 (two) times daily.      Marland Kitchen DIPHENHYDRAMINE HCL (SLEEP) 25 MG PO TABS Oral Take 25 mg by mouth as needed.      . FENOFIBRATE 145 MG PO TABS Oral Take 145 mg by mouth daily.      Marland Kitchen FLUCONAZOLE 200 MG PO TABS Oral Take 200 mg by mouth once a week.      Marland Kitchen FLUTICASONE PROPIONATE  HFA 220 MCG/ACT IN AERO  Inhalation Inhale 1 puff into the lungs 2 (two) times daily.      . IBUPROFEN 200 MG PO TABS Oral Take 200 mg by mouth every 6 (six) hours as needed.      . INSULIN ASPART 100 UNIT/ML Platinum SOLN Subcutaneous Inject into the skin 3 (three) times daily before meals.      . INSULIN GLARGINE 100 UNIT/ML Terral SOLN Subcutaneous Inject into the skin at bedtime.      . IPRATROPIUM BROMIDE 0.02 % IN SOLN Nebulization Take 500 mcg by nebulization as needed.      Valetta Fuller HOT EX Apply externally Apply topically.      Marland Kitchen METFORMIN HCL 1000 MG PO TABS Oral Take 1,000 mg by mouth 2 (two) times daily with a meal.      . NEOMYCIN-POLYMYXIN-HC 3.5-10000-1 OT SUSP Otic Place 4 drops in ear(s) 4 (four) times daily. X 7 days 10 mL 0  . OMEPRAZOLE 40 MG PO CPDR  TAKE 1 CAPSULE BY MOUTH EVERY DAY 15 capsule 0    Patient must have follow up for more  refills  . OXYCODONE HCL ER 10 MG PO TB12 Oral Take 10 mg by mouth every 12 (twelve) hours.      Marland Kitchen POTASSIUM GLUCONATE 595 MG PO CAPS Oral Take 1 capsule by mouth daily.      Marland Kitchen PROMETHAZINE HCL 25 MG PO TABS Oral Take 25 mg by mouth every 6 (six) hours as needed.        BP 155/131  Pulse 111  Temp 98.9 F (37.2 C) (Oral)  Resp 20  Ht 6\' 4"  (1.93 m)  Wt 355 lb (161.027 kg)  BMI 43.21 kg/m2  SpO2 98%  Physical Exam  Nursing note and vitals reviewed. Constitutional: He is oriented to person, place, and time. He appears well-developed and well-nourished.  HENT:  Head: Normocephalic and atraumatic.  Right Ear: External ear normal.  Left Ear: External ear normal.       Actively moving bug in left external auditory canal; I could visualize wing movement.  Eyes: Conjunctivae normal and EOM are normal. Pupils are equal, round, and reactive to light.  Neck: Normal range of motion and phonation normal. Neck supple.  Cardiovascular: Normal rate.   Pulmonary/Chest: Effort normal. He exhibits no bony tenderness.  Abdominal: Normal appearance.  Musculoskeletal: Normal range of  motion.  Neurological: He is alert and oriented to person, place, and time. He has normal strength. No cranial nerve deficit or sensory deficit. He exhibits normal muscle tone. Coordination normal.  Skin: Skin is warm, dry and intact.  Psychiatric: He has a normal mood and affect. His behavior is normal. Judgment and thought content normal.    ED Course  Procedures (including critical care time) Emergency department treatment: Insect smothered and ear analgesic given with viscous Xylocaine  Foreign body, removed from left external auditory canal with alligator forceps. Insect was removed, intact.   Visualization of the left TM post extraction of inspect; scattered areas of excoriation and bleeding. No evident perforation or TM deformity.   Labs Reviewed - No data to display No results found.   1. Foreign body in ear       MDM  For my removed from left external auditory canal with mild, localized trauma to the tympanic membrane. He stable for discharge with outpatient treatment.    Plan: Home Medications- Cortisporin otic; Home Treatments- Tylenol prn; Recommended follow up- pcp prn    Flint Melter, MD 10/06/12 [phone]

## 2012-10-06 NOTE — ED Notes (Signed)
MD retrieved insect with alligator forceps. Patient tolerated well. Insect approximately 1" long and 1/2" wide.

## 2012-10-06 NOTE — ED Notes (Signed)
Patient states he went outside to smoke and felt a moth fly into his left ear. States he can feel it "moving and flapping around in there." Patient complaining of severe pain to left ear. Black debris noted when looking into patient's left ear.

## 2012-10-06 NOTE — ED Notes (Signed)
Irrigated patient's left ear with warm water. Only clear water return with irrigation. Able to see insect clearly with otoscope but unable to flush it out. Advised MD.

## 2012-10-06 NOTE — ED Notes (Signed)
Patient states he no longer feels movement from insect in his left ear. Advised MD.

## 2012-10-06 NOTE — ED Notes (Signed)
Pt reporting he has a moth in left ear.  States he can feel it moving and flapping wings.

## 2013-04-19 DIAGNOSIS — I2699 Other pulmonary embolism without acute cor pulmonale: Secondary | ICD-10-CM

## 2013-04-19 HISTORY — DX: Other pulmonary embolism without acute cor pulmonale: I26.99

## 2013-04-26 ENCOUNTER — Observation Stay (HOSPITAL_COMMUNITY)
Admission: EM | Admit: 2013-04-26 | Discharge: 2013-04-27 | Disposition: A | Discharge status: Home / Self Care | Payer: Medicare Other | Attending: Internal Medicine | Admitting: Internal Medicine

## 2013-04-26 ENCOUNTER — Emergency Department (HOSPITAL_COMMUNITY): Payer: Medicare Other

## 2013-04-26 ENCOUNTER — Encounter (HOSPITAL_COMMUNITY): Payer: Self-pay | Admitting: *Deleted

## 2013-04-26 DIAGNOSIS — J449 Chronic obstructive pulmonary disease, unspecified: Secondary | ICD-10-CM

## 2013-04-26 DIAGNOSIS — R079 Chest pain, unspecified: Secondary | ICD-10-CM | POA: Insufficient documentation

## 2013-04-26 DIAGNOSIS — E109 Type 1 diabetes mellitus without complications: Secondary | ICD-10-CM | POA: Insufficient documentation

## 2013-04-26 DIAGNOSIS — Z9861 Coronary angioplasty status: Secondary | ICD-10-CM | POA: Diagnosis present

## 2013-04-26 DIAGNOSIS — I2510 Atherosclerotic heart disease of native coronary artery without angina pectoris: Secondary | ICD-10-CM | POA: Diagnosis present

## 2013-04-26 DIAGNOSIS — R0602 Shortness of breath: Secondary | ICD-10-CM | POA: Insufficient documentation

## 2013-04-26 DIAGNOSIS — Z86711 Personal history of pulmonary embolism: Secondary | ICD-10-CM | POA: Diagnosis present

## 2013-04-26 DIAGNOSIS — I2699 Other pulmonary embolism without acute cor pulmonale: Principal | ICD-10-CM | POA: Insufficient documentation

## 2013-04-26 DIAGNOSIS — I10 Essential (primary) hypertension: Secondary | ICD-10-CM | POA: Insufficient documentation

## 2013-04-26 DIAGNOSIS — E785 Hyperlipidemia, unspecified: Secondary | ICD-10-CM | POA: Insufficient documentation

## 2013-04-26 DIAGNOSIS — R739 Hyperglycemia, unspecified: Secondary | ICD-10-CM

## 2013-04-26 LAB — BASIC METABOLIC PANEL
BUN: 23 mg/dL (ref 6–23)
CO2: 22 mEq/L (ref 19–32)
Calcium: 9.8 mg/dL (ref 8.4–10.5)
Chloride: 96 mEq/L (ref 96–112)
Creatinine, Ser: 1.27 mg/dL (ref 0.50–1.35)
GFR calc Af Amer: 80 mL/min — ABNORMAL LOW (ref >90)
GFR calc non Af Amer: 69 mL/min — ABNORMAL LOW (ref >90)
Glucose, Bld: 407 mg/dL — ABNORMAL HIGH (ref 70–99)
Potassium: 4.4 mEq/L (ref 3.5–5.1)
Sodium: 134 mEq/L — ABNORMAL LOW (ref 135–145)

## 2013-04-26 LAB — PROTIME-INR
INR: 0.99 (ref 0.00–1.49)
Prothrombin Time: 13 seconds (ref 11.6–15.2)

## 2013-04-26 LAB — APTT: aPTT: 30 seconds (ref 24–37)

## 2013-04-26 LAB — TROPONIN I: Troponin I: <0.3 ng/mL (ref <0.30)

## 2013-04-26 LAB — D-DIMER, QUANTITATIVE (NOT AT ARMC): D-Dimer, Quant: 0.51 ug/mL-FEU — ABNORMAL HIGH (ref 0.00–0.48)

## 2013-04-26 MED ORDER — IOHEXOL 350 MG/ML SOLN
120.0000 mL | Freq: Once | INTRAVENOUS | Status: AC | PRN
  Administered 2013-04-26: 120 mL via INTRAVENOUS

## 2013-04-26 MED ORDER — ALBUTEROL SULFATE (5 MG/ML) 0.5% IN NEBU
5.0000 mg | INHALATION_SOLUTION | Freq: Once | RESPIRATORY_TRACT | Status: AC
  Administered 2013-04-26: 5 mg via RESPIRATORY_TRACT
  Filled 2013-04-26: qty 1

## 2013-04-26 MED ORDER — ALBUTEROL SULFATE (5 MG/ML) 0.5% IN NEBU: 2.5000 mg | INHALATION_SOLUTION | Freq: Once | RESPIRATORY_TRACT | Status: DC

## 2013-04-26 MED ORDER — IPRATROPIUM BROMIDE 0.02 % IN SOLN
0.5000 mg | Freq: Once | RESPIRATORY_TRACT | Status: AC
  Administered 2013-04-26: 0.5 mg via RESPIRATORY_TRACT
  Filled 2013-04-26: qty 2.5

## 2013-04-26 MED ORDER — ENOXAPARIN SODIUM 80 MG/0.8ML ~~LOC~~ SOLN
150.0000 mg | Freq: Once | SUBCUTANEOUS | Status: AC
  Administered 2013-04-26: 150 mg via SUBCUTANEOUS
  Filled 2013-04-26: qty 1.6

## 2013-04-26 NOTE — ED Notes (Signed)
Left sided cp x 4 days.  Went to pcp today and was told he had an abnormal ecg and was told to come to ED.

## 2013-04-26 NOTE — ED Provider Notes (Signed)
History  This chart was scribed for Ward Givens, MD by Bennett Scrape, ED Scribe. This patient was seen in room APA19/APA19 and the patient's care was started at [facility].  CSN: [id number]  Arrival date & time 04/26/13  1823   First MD Initiated Contact with Patient 04/26/13 1835      Chief Complaint  Patient presents with  . Chest Pain    Patient is a 42 y.o. male presenting with chest pain. The history is provided by the patient. No language interpreter was used.  Chest Pain Pain location:  L chest Pain quality: sharp   Pain radiates to:  Does not radiate Pain radiates to the back: no   Onset quality:  Gradual Duration:  4 days Progression:  Waxing and waning Chronicity:  New Worsened by:  Deep breathing and exertion Associated symptoms: diaphoresis, nausea (chronic) and shortness of breath   Associated symptoms: no cough, no fever and not vomiting   Risk factors: coronary artery disease and smoking     HPI Comments: Joseph [name] is a 42 y.o. male with a h/o CAD, HTN and HLD who presents to the Emergency Department complaining of 4 days of waxing and waning left-sided CP described as heavy with occasional sharp pains with associated occasional SOB and diaphoresis. Last episode of diaphoresis was en route to the ED, earlier today and last night. The pain is occasionally aggravated by exertion, deep breathing and turning his head. There are no alleviating factors. He reports taking 15 mg percocet and 1 mg xanax with improvement. He states that he has xanax and percocet prescriptions due to "really bad joints and a bad back". He states that he was seen by his PCP today for the same, had a CXR and EKG and was told that he had an abnormal EKG and PNA. At that point, he was told to go to [facility] but came to [facility] because it was closer. He reports prior episodes of similar symptoms which his wife states he was admitted for a TIA "several years ago". He has been evaluated by [facility]  Cardiology and was put on a different HTN medication. He reports one prior stress test 7 years ago that was normal. Wife states that the pt has had one prior MI in 1997. Pt denies any immediate family h/o heart problems. He denies new onset nausea, cough and leg swelling as associated symptoms.  He admits to having chronic nausea for which he takes phenergan. Wife reports wheezing at night and confirms prior inhaler use.  Pt is a current everyday smoker and occasional alcohol user.  PCP is NP  [place] at [facility]    Past Medical History  Diagnosis Date  . CAD (coronary artery disease)   . Hypertension   . Hyperlipidemia   . Diabetes mellitus     Past Surgical History  Procedure Laterality Date  . Knee arthoplasty    . Carpel tunnel      No family history on file. CAD AODM RAD Cancer  History  Substance Use Topics  . Smoking status: Current Every Day Smoker -- 1.50 packs/day    Types: Cigarettes  . Smokeless tobacco: Not on file  . Alcohol Use: Yes     Comment: occasional  lives at home Lives with wife On disability for his back and joints   Review of Systems  Constitutional: Positive for diaphoresis. Negative for fever and chills.  Respiratory: Positive for shortness of breath and wheezing (ongoing). Negative  for cough.   Cardiovascular: Positive for chest pain. Negative for leg swelling.  Gastrointestinal: Positive for nausea (chronic). Negative for vomiting.  All other systems reviewed and are negative.    Allergies  Sulfa antibiotics  Home Medications   Current Outpatient Rx  Name  Route  Sig  Dispense  Refill  . albuterol (PROAIR HFA) 108 (90 BASE) MCG/ACT inhaler   Inhalation   Inhale 2 puffs into the lungs every 6 (six) hours as needed.           . ALPRAZolam (XANAX) 1 MG tablet   Oral   Take 1 mg by mouth 2 (two) times daily.           Marland Kitchen amLODipine (NORVASC) 2.5 MG tablet   Oral   Take 2.5 mg by mouth daily.            Marland Kitchen aspirin 81 MG tablet   Oral   Take 81 mg by mouth daily.           Marland Kitchen buPROPion (WELLBUTRIN XL) 150 MG 24 hr tablet   Oral   Take 150 mg by mouth 2 (two) times daily.           . diphenhydrAMINE (SOMINEX) 25 MG tablet   Oral   Take 25 mg by mouth as needed.           . fenofibrate (TRICOR) 145 MG tablet   Oral   Take 145 mg by mouth daily.           . fluconazole (DIFLUCAN) 200 MG tablet   Oral   Take 200 mg by mouth once a week.           . fluticasone (FLOVENT HFA) 220 MCG/ACT inhaler   Inhalation   Inhale 1 puff into the lungs 2 (two) times daily.           Marland Kitchen ibuprofen (ADVIL,MOTRIN) 200 MG tablet   Oral   Take 200 mg by mouth every 6 (six) hours as needed.           . insulin aspart (NOVOLOG) 100 UNIT/ML injection   Subcutaneous   Inject into the skin 3 (three) times daily before meals.           . insulin glargine (LANTUS) 100 UNIT/ML injection   Subcutaneous   Inject into the skin at bedtime.           Marland Kitchen ipratropium (ATROVENT) 0.02 % nebulizer solution   Nebulization   Take 500 mcg by nebulization as needed.           . Menthol, Topical Analgesic, (ICY HOT EX)   Apply externally   Apply topically.           . metFORMIN (GLUCOPHAGE) 1000 MG tablet   Oral   Take 1,000 mg by mouth 2 (two) times daily with a meal.           . neomycin-polymyxin-hydrocortisone (CORTISPORIN) 3.5-10000-1 otic suspension   Otic   Place 4 drops in ear(s) 4 (four) times daily. X 7 days   10 mL   0   . omeprazole (PRILOSEC) 40 MG capsule      TAKE 1 CAPSULE BY MOUTH EVERY DAY   15 capsule   0     Patient must have follow up for more refills   . oxyCODONE (OXYCONTIN) 10 MG 12 hr tablet   Oral   Take 10 mg by mouth every 12 (twelve) hours.           Marland Kitchen  Potassium Gluconate 595 MG CAPS   Oral   Take 1 capsule by mouth daily.           . promethazine (PHENERGAN) 25 MG tablet   Oral   Take 25 mg by mouth every 6 (six) hours as needed.              Triage Vitals: BP 123/85  Pulse 108  Temp(Src) 97.9 F (36.6 C) (Oral)  Resp 18  Ht 6\' 4"  (1.93 m)  Wt 361 lb 4 oz (163.862 kg)  BMI 43.99 kg/m2  SpO2 100%  Vital signs normal except tachycardia   Physical Exam  Nursing note and vitals reviewed. Constitutional: He is oriented to person, place, and time. He appears well-developed and well-nourished.  Non-toxic appearance. He does not appear ill. No distress.  obese  HENT:  Head: Normocephalic and atraumatic.  Right Ear: External ear normal.  Left Ear: External ear normal.  Nose: Nose normal. No mucosal edema or rhinorrhea.  Mouth/Throat: Oropharynx is clear and moist and mucous membranes are normal. No dental abscesses or edematous.  Eyes: Conjunctivae and EOM are normal. Pupils are equal, round, and reactive to light.  Neck: Normal range of motion and full passive range of motion without pain. Neck supple.  Cardiovascular: Normal rate, regular rhythm and normal heart sounds.  Exam reveals no gallop and no friction rub.   No murmur heard. Pulmonary/Chest: Effort normal. No respiratory distress. He has wheezes (few inspiratory wheezing). He has no rhonchi. He has no rales. He exhibits tenderness (mild tenderness to left chest). He exhibits no crepitus.  Abdominal: Soft. Normal appearance and bowel sounds are normal. He exhibits no distension. There is no tenderness. There is no rebound and no guarding.  Musculoskeletal: Normal range of motion. He exhibits no edema and no tenderness.  Moves all extremities well.   Neurological: He is alert and oriented to person, place, and time. He has normal strength. No cranial nerve deficit.  Skin: Skin is warm, dry and intact. No rash noted. No erythema. No pallor.  Psychiatric: He has a normal mood and affect. His speech is normal and behavior is normal. His mood appears not anxious.    ED Course  Procedures (including critical care time)  Medications  albuterol (PROVENTIL) (5  MG/ML) 0.5% nebulizer solution 2.5 mg (2.5 mg Nebulization Not Given 04/26/13 1928)  albuterol (PROVENTIL) (5 MG/ML) 0.5% nebulizer solution 5 mg (5 mg Nebulization Given 04/26/13 1927)  ipratropium (ATROVENT) nebulizer solution 0.5 mg (0.5 mg Nebulization Given 04/26/13 1927)  iohexol (OMNIPAQUE) 350 MG/ML injection 120 mL (120 mLs Intravenous Contrast Given 04/26/13 2200)  enoxaparin (LOVENOX) injection 150 mg (150 mg Subcutaneous Given 04/26/13 2346)  insulin glargine (LANTUS) injection 50 Units (50 Units Subcutaneous Given 04/27/13 0045)    DIAGNOSTIC STUDIES: Oxygen Saturation is 100% on room air, normal by my interpretation.    COORDINATION OF CARE: 7:10 PM-Advised pt that the EKG in the ED was similar to EKG down in September 2008. Discussed treatment plan which includes breathing treatment, CXR, d-dimer, BMP and troponin with pt at bedside and pt agreed to plan.   8:52 PM-Pt rechecked and feels improved with medications listed above. Informed pt of negative radiology and borderline d-dimer results. Advised pt that CBG was 400. Pt states that he is active daily. Discussed ordering a CT of angiochest with pt to completely rule out PE and pt agreed.  22:52 Radiologist called CT results.   10:46 PM-Informed pt of positive radiology results. Will  talk to hospitalist about admission  23:25 Dr Laqueta Linden, admit to obs, tele  Pharmacy consult give lovenox 150 mg BID    Results for orders placed during the hospital encounter of 04/26/13  BASIC METABOLIC PANEL      Result Value Range   Sodium 134 (*) 135 - 145 mEq/L   Potassium 4.4  3.5 - 5.1 mEq/L   Chloride 96  96 - 112 mEq/L   CO2 22  19 - 32 mEq/L   Glucose, Bld 407 (*) 70 - 99 mg/dL   BUN 23  6 - 23 mg/dL   Creatinine, Ser 1.61  0.50 - 1.35 mg/dL   Calcium 9.8  8.4 - 09.6 mg/dL   GFR calc non Af Amer 69 (*) >90 mL/min   GFR calc Af Amer 80 (*) >90 mL/min  TROPONIN I      Result Value Range   Troponin I <0.30  <0.30 ng/mL  D-DIMER,  QUANTITATIVE      Result Value Range   D-Dimer, Quant 0.51 (*) 0.00 - 0.48 ug/mL-FEU  APTT      Result Value Range   aPTT 30  24 - 37 seconds  PROTIME-INR      Result Value Range   Prothrombin Time 13.0  11.6 - 15.2 seconds   INR 0.99  0.00 - 1.49  GLUCOSE, CAPILLARY      Result Value Range   Glucose-Capillary 274 (*) 70 - 99 mg/dL   Laboratory interpretation all normal except midly + Ddimer, hyperglycemia    Dg Chest Portable 1 View  04/26/2013  *RADIOLOGY REPORT*  Clinical Data: Chest pain.  Smoker.  PORTABLE CHEST - 1 VIEW  Comparison: One-view chest 05/21/2009.  Findings: The heart size is normal.  Mild prominence of the hila is this table.  No definite adenopathy or mass lesion is present.  The right hemidiaphragm remains somewhat elevated.  IMPRESSION:  1.  Low lung volumes. 2.  Mild prominence of the hila bilaterally.  This is stable without definite adenopathy. 3.  No acute cardiopulmonary disease.   Original Report Authenticated By: Marin Roberts, M.D.    Ct Angio Chest W/cm &/or Wo Cm  04/26/2013  *RADIOLOGY REPORT*  Clinical Data: Chest pain.  Mildly elevated D-dimer  CT ANGIOGRAPHY CHEST  Technique:  Multidetector CT imaging of the chest using the standard protocol during bolus administration of intravenous contrast. Multiplanar reconstructed images including MIPs were obtained and reviewed to evaluate the vascular anatomy.  Contrast: OMNIPAQUE IOHEXOL 350 MG/ML SOLN  Comparison: Chest radiograph 04/26/2013  Findings: There are filling defects within the proximal segmental arteries of the left lower lobe pulmonary arteries consistent with acute thromboembolism.  These emboli are seen on images 122 through 147 of series 9.   No additional pulmonary emboli are identified.  No acute findings of the aorta or great vessels.  No pericardial fluid.  Coronary calcifications are noted.  Review of the lung windows demonstrates no pleural fluid or consolidation.  Airways are normal.   Limited view of the upper abdomen is unremarkable.  Limited view of the skeleton demonstrates mild degenerative spurring.  IMPRESSION:  1.  Acute pulmonary embolism within the proximal segmental branches of the left lower lobe pulmonary arteries. Overall clot burden is mild to moderate.  2.  No acute pulmonary parenchymal findings.  Critical results were conveyed to Dr. Lynelle Doctor on 04/27/2039 at 2230 hours  Original Report Authenticated By: Genevive Bi, M.D.      Date: 04/27/2013  Rate: 100  Rhythm: normal sinus rhythm  QRS Axis: left  Intervals: normal  ST/T Wave abnormalities: normal  Conduction Disutrbances:IRBBB  Narrative Interpretation: low voltage, Q waves inf leads  Old EKG Reviewed: unchanged from 09/08/2007     1. Pulmonary embolus   2. Hyperglycemia   3. COPD (chronic obstructive pulmonary disease)    Plan admission  Devoria Albe, MD, FACEP   MDM    I personally performed the services described in this documentation, which was scribed in my presence. The recorded information has been reviewed and considered.  Devoria Albe, MD, Armando Gang    Ward Givens, MD 04/27/13 [phone]

## 2013-04-26 NOTE — ED Notes (Signed)
Resting quietly in no apparent distress, s/p physician exam. states his chest discomfort is the same as it's been for 4 days. "feels like someone is kicking me in the chest"

## 2013-04-27 ENCOUNTER — Encounter (HOSPITAL_COMMUNITY): Payer: Self-pay | Admitting: Cardiology

## 2013-04-27 DIAGNOSIS — I10 Essential (primary) hypertension: Secondary | ICD-10-CM

## 2013-04-27 DIAGNOSIS — I2699 Other pulmonary embolism without acute cor pulmonale: Secondary | ICD-10-CM

## 2013-04-27 DIAGNOSIS — E109 Type 1 diabetes mellitus without complications: Secondary | ICD-10-CM

## 2013-04-27 DIAGNOSIS — R079 Chest pain, unspecified: Secondary | ICD-10-CM

## 2013-04-27 DIAGNOSIS — Z86711 Personal history of pulmonary embolism: Secondary | ICD-10-CM | POA: Diagnosis present

## 2013-04-27 DIAGNOSIS — I2510 Atherosclerotic heart disease of native coronary artery without angina pectoris: Secondary | ICD-10-CM

## 2013-04-27 LAB — GLUCOSE, CAPILLARY
Glucose-Capillary: 274 mg/dL — ABNORMAL HIGH (ref 70–99)
Glucose-Capillary: 289 mg/dL — ABNORMAL HIGH (ref 70–99)
Glucose-Capillary: 350 mg/dL — ABNORMAL HIGH (ref 70–99)

## 2013-04-27 LAB — CBC WITH DIFFERENTIAL/PLATELET
Basophils Absolute: 0 10*3/uL (ref 0.0–0.1)
Basophils Relative: 0 % (ref 0–1)
Eosinophils Absolute: 0.3 10*3/uL (ref 0.0–0.7)
Eosinophils Relative: 3 % (ref 0–5)
HCT: 39 % (ref 39.0–52.0)
Hemoglobin: 14 g/dL (ref 13.0–17.0)
Lymphocytes Relative: 40 % (ref 12–46)
Lymphs Abs: 5.1 10*3/uL — ABNORMAL HIGH (ref 0.7–4.0)
MCH: 30.2 pg (ref 26.0–34.0)
MCHC: 35.9 g/dL (ref 30.0–36.0)
MCV: 84.2 fL (ref 78.0–100.0)
Monocytes Absolute: 0.8 10*3/uL (ref 0.1–1.0)
Monocytes Relative: 6 % (ref 3–12)
Neutro Abs: 6.5 10*3/uL (ref 1.7–7.7)
Neutrophils Relative %: 51 % (ref 43–77)
Platelets: 290 10*3/uL (ref 150–400)
RBC: 4.63 MIL/uL (ref 4.22–5.81)
RDW: 12.7 % (ref 11.5–15.5)
WBC: 12.8 10*3/uL — ABNORMAL HIGH (ref 4.0–10.5)

## 2013-04-27 LAB — HEMOGLOBIN A1C
Hgb A1c MFr Bld: 11.3 % — ABNORMAL HIGH
Mean Plasma Glucose: 278 mg/dL — ABNORMAL HIGH

## 2013-04-27 LAB — HOMOCYSTEINE: Homocysteine: 18.5 umol/L — ABNORMAL HIGH (ref 4.0–15.4)

## 2013-04-27 LAB — ANTITHROMBIN III: AntiThromb III Func: 89 % (ref 75–120)

## 2013-04-27 MED ORDER — FENOFIBRATE 160 MG PO TABS
160.0000 mg | ORAL_TABLET | Freq: Every day | ORAL | Status: DC
  Administered 2013-04-27: 160 mg via ORAL
  Filled 2013-04-27: qty 1

## 2013-04-27 MED ORDER — INSULIN GLARGINE 100 UNIT/ML ~~LOC~~ SOLN
50.0000 [IU] | Freq: Once | SUBCUTANEOUS | Status: AC
  Administered 2013-04-27: 50 [IU] via SUBCUTANEOUS
  Filled 2013-04-27: qty 0.5

## 2013-04-27 MED ORDER — ALPRAZOLAM 1 MG PO TABS
2.0000 mg | ORAL_TABLET | Freq: Every day | ORAL | Status: DC
  Administered 2013-04-27: 2 mg via ORAL
  Filled 2013-04-27: qty 2

## 2013-04-27 MED ORDER — BUPROPION HCL ER (XL) 150 MG PO TB24
150.0000 mg | ORAL_TABLET | Freq: Two times a day (BID) | ORAL | Status: DC
  Administered 2013-04-27: 150 mg via ORAL
  Filled 2013-04-27: qty 1

## 2013-04-27 MED ORDER — INSULIN ASPART 100 UNIT/ML ~~LOC~~ SOLN
4.0000 [IU] | Freq: Three times a day (TID) | SUBCUTANEOUS | Status: DC
  Administered 2013-04-27: 4 [IU] via SUBCUTANEOUS

## 2013-04-27 MED ORDER — GABAPENTIN 300 MG PO CAPS
300.0000 mg | ORAL_CAPSULE | Freq: Every morning | ORAL | Status: DC
  Administered 2013-04-27: 300 mg via ORAL
  Filled 2013-04-27: qty 1

## 2013-04-27 MED ORDER — RIVAROXABAN 20 MG PO TABS: 20.0000 mg | ORAL_TABLET | Freq: Every day | ORAL | Status: DC

## 2013-04-27 MED ORDER — ENOXAPARIN (LOVENOX) PATIENT EDUCATION KIT
PACK | Freq: Once | Status: DC
  Filled 2013-04-27: qty 1

## 2013-04-27 MED ORDER — RIVAROXABAN 15 MG PO TABS: 15.0000 mg | ORAL_TABLET | Freq: Two times a day (BID) | ORAL | Status: DC

## 2013-04-27 MED ORDER — ASPIRIN 81 MG PO TBEC: 81.0000 mg | DELAYED_RELEASE_TABLET | Freq: Every day | ORAL | Status: DC

## 2013-04-27 MED ORDER — OXYCODONE HCL 5 MG PO TABS
15.0000 mg | ORAL_TABLET | ORAL | Status: DC | PRN
  Administered 2013-04-27 (×2): 15 mg via ORAL
  Filled 2013-04-27 (×2): qty 3

## 2013-04-27 MED ORDER — FLUTICASONE PROPIONATE HFA 220 MCG/ACT IN AERO
1.0000 | INHALATION_SPRAY | Freq: Two times a day (BID) | RESPIRATORY_TRACT | Status: DC
  Administered 2013-04-27: 1 via RESPIRATORY_TRACT
  Filled 2013-04-27: qty 12

## 2013-04-27 MED ORDER — ALBUTEROL SULFATE HFA 108 (90 BASE) MCG/ACT IN AERS: 2.0000 | INHALATION_SPRAY | Freq: Four times a day (QID) | RESPIRATORY_TRACT | Status: DC | PRN

## 2013-04-27 MED ORDER — ENOXAPARIN SODIUM 100 MG/ML ~~LOC~~ SOLN: 150.0000 mg | Freq: Two times a day (BID) | SUBCUTANEOUS | Status: DC

## 2013-04-27 MED ORDER — INSULIN ASPART 100 UNIT/ML ~~LOC~~ SOLN
0.0000 [IU] | Freq: Three times a day (TID) | SUBCUTANEOUS | Status: DC
  Administered 2013-04-27: 11 [IU] via SUBCUTANEOUS

## 2013-04-27 MED ORDER — ONDANSETRON HCL 4 MG/2ML IJ SOLN: 4.0000 mg | Freq: Four times a day (QID) | INTRAMUSCULAR | Status: DC | PRN

## 2013-04-27 NOTE — Progress Notes (Signed)
  RD consulted for nutrition education regarding diabetes and heart healthy diet guidelines.   No results found for this basename: HGBA1C   RD provided "Carbohydrate Counting for People with Diabetes" handout from the Academy of [employer] and [employer]etics. Discussed different food groups and their effects on blood sugar, emphasizing carbohydrate-containing foods. Provided list of carbohydrates and recommended serving sizes of common foods.Discussed importance of controlled and consistent carbohydrate intake throughout the day. Provided examples of ways to balance meals/snacks and encouraged intake of high-fiber, whole grain complex carbohydrates.   RD also provided "Heart Healthy Nutrition Therapy" handout from the Academy of [employer] and Dietetics. Reviewed patient's dietary recall. Provided examples on ways to decrease sodium and fat intake in diet. Discouraged intake of processed foods and use of salt shaker. Encouraged fresh fruits and vegetables as well as whole grain sources of carbohydrates to maximize fiber intake. Teach back method used.  Expect fair-poor compliance.  Body mass index is 43.65 kg/(m^2). Pt meets criteria for extreme obesity Class III based on current BMI.  Current diet order is CHO Modified/Heart Healthy, patient is consuming approximately n/a % of meals at this time. Labs and medications reviewed. No further nutrition interventions warranted at this time. RD contact information provided. If additional nutrition issues arise, please re-consult RD.  Royann Shivers MS,RD,LDN,CSG Office: [phone] Pager: [phone]

## 2013-04-27 NOTE — H&P (Signed)
[facility] History and Physical  Sujay Grundman Cardell ZOX:[id number] DOB: [birth year] DOA: 04/26/2013  Referring physician: Weber Cooks PCP: Egbert Garibaldi, NP  Specialists: none  Chief Complaint: Chest pain  HPI: Joseph [name] is a 42 y.o. male with hypertension, type 2 diabetes with complications of severe peripheral neuropathy, coronary artery disease and hyperlipidemia who presented to the emergency department this evening complaining of chest pain. He felt that he had potentially pulled a muscle in his left upper chest a few days ago but the pain today became more sharp and was associated with worsening shortness of breath and several episodes of intense coughing episodes. He reports being more fatigued than usual over the past one to 2 weeks, he has chronic leg pain from peripheral neuropathy and significant sensory loss in his bilateral lower extremities from the knee down. He does not report any asymmetric swelling of his lower extremities..  The d-dimer was obtained on initial lab work which was positive. The CT angiogram was obtained which revealed mild to moderate pulmonary emboli. Patient denies any history of immobilization or travel, he is at baseline disabled due to arthritis and age advanced degenerative disc disease. He has no family history of any clotting disorders. He has never had a blood clot in the past. He has not had any trauma to his legs but he also states that he may have not been aware of an injury to his legs because of his neuropathy.   Review of Systems: Review of Systems  Constitutional: Positive for malaise/fatigue and diaphoresis. Negative for fever, chills and weight loss.  HENT: Positive for congestion and neck pain.   Eyes: Positive for blurred vision.  Respiratory: Positive for cough, shortness of breath and wheezing.   Cardiovascular: Positive for chest pain, orthopnea and leg swelling.  Gastrointestinal: Positive for heartburn.  Genitourinary:  Negative.   Musculoskeletal: Positive for myalgias, back pain and joint pain.  Skin: Negative for itching and rash.  Neurological: Positive for dizziness and weakness. Negative for seizures and loss of consciousness.  Psychiatric/Behavioral: Negative for depression, suicidal ideas, hallucinations, memory loss and substance abuse. The patient is not nervous/anxious and does not have insomnia.   All other systems reviewed and are negative.     Past Medical History  Diagnosis Date  . CAD (coronary artery disease)   . Hypertension   . Hyperlipidemia   . Diabetes mellitus    Past Surgical History  Procedure Laterality Date  . Knee arthoplasty    . Carpel tunnel     Social History:  reports that he has been smoking Cigarettes.  He has been smoking about 1.50 packs per day. He does not have any smokeless tobacco history on file. He reports that  drinks alcohol. He reports that he does not use illicit drugs. 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 Prior psychiatric hospitalization for depression.   Allergies  Allergen Reactions  . Sulfa Antibiotics Other (See Comments)    headache    History reviewed. No pertinent family history.    Prior to Admission medications   Medication Sig Start Date End Date Taking? Authorizing Provider  ALPRAZolam Prudy Feeler) 1 MG tablet Take 2 mg by mouth at bedtime.    Yes Historical Provider, MD  amLODipine (NORVASC) 2.5 MG tablet Take 2.5 mg by mouth every morning.    Yes Historical Provider, MD  aspirin 81 MG tablet Take 81 mg by mouth every morning.    Yes  Historical Provider, MD  buPROPion (WELLBUTRIN XL) 150 MG 24 hr tablet Take 150 mg by mouth 2 (two) times daily.     Yes Historical Provider, MD  diclofenac (VOLTAREN) 75 MG EC tablet Take 75 mg by mouth 2 (two) times daily.   Yes Historical Provider, MD  diphenhydrAMINE (SOMINEX) 25 MG tablet Take 50-75 mg by mouth daily as needed for itching or allergies.     Yes Historical Provider, MD  fenofibrate (TRICOR) 145 MG tablet Take 145 mg by mouth every morning.    Yes Historical Provider, MD  fluconazole (DIFLUCAN) 200 MG tablet Take 200 mg by mouth once a week. *Take on Monday mornings*   Yes Historical Provider, MD  gabapentin (NEURONTIN) 300 MG capsule Take 300 mg by mouth every morning.   Yes Historical Provider, MD  ibuprofen (ADVIL,MOTRIN) 200 MG tablet Take 1,200 mg by mouth every 6 (six) hours as needed for pain.    Yes Historical Provider, MD  insulin aspart (NOVOLOG) 100 UNIT/ML injection Inject into the skin 3 (three) times daily before meals.     Yes Historical Provider, MD  insulin glargine (LANTUS) 100 UNIT/ML injection Inject 30-50 Units into the skin 2 (two) times daily. 30 units injected in the morning and 50 units at bedtime   Yes Historical Provider, MD  lisinopril-hydrochlorothiazide (PRINZIDE,ZESTORETIC) 20-12.5 MG per tablet Take 1 tablet by mouth every morning.   Yes Historical Provider, MD  metFORMIN (GLUCOPHAGE) 1000 MG tablet Take 1,000 mg by mouth 2 (two) times daily with a meal.     Yes Historical Provider, MD  oxyCODONE (ROXICODONE) 15 MG immediate release tablet Take 15 mg by mouth 4 (four) times daily.   Yes Historical Provider, MD  Potassium Gluconate 595 MG CAPS Take 1 capsule by mouth 2 (two) times daily.    Yes Historical Provider, MD  promethazine (PHENERGAN) 25 MG tablet Take 25 mg by mouth every 6 (six) hours as needed for nausea.    Yes Historical Provider, MD  albuterol (PROAIR HFA) 108 (90 BASE) MCG/ACT inhaler Inhale 2 puffs into the lungs every 6 (six) hours as needed.      Historical Provider, MD  fluticasone (FLOVENT HFA) 220 MCG/ACT inhaler Inhale 1 puff into the lungs 2 (two) times daily.      Historical Provider, MD  ipratropium (ATROVENT) 0.02 % nebulizer solution Take 500 mcg by nebulization as needed.      Historical Provider, MD   Physical Exam: Filed Vitals:   04/26/13 2245 04/27/13 0019 04/27/13 0140  04/27/13 0512  BP: 114/73 112/76 112/75 92/52  Pulse: 92 88 94 81  Temp:   98 F (36.7 C) 97.3 F (36.3 C)  TempSrc:   Oral Oral  Resp: 20 25 22 20   Height:      Weight:   162.6 kg (358 lb 7.5 oz)   SpO2: 96% 97% 100% 95%   General:  Alert and oriented x3, well-nourished well-developed pleasant and talkative and cooperative exam and and history  Eyes: Normal  ENT: Normal  Neck: Normal no JVD  Cardiovascular: Regular rate and rhythm no murmurs rubs or gallops  Respiratory: Scattered upper airway rhonchi but otherwise good air movement  Abdomen: Soft nontender  Skin: No rashes, 1+ pitting edema in his lower extremities multiple scars from healed lesions on his lower legs   Musculoskeletal: Normal muscle tone and bulk moves all 4 extremities equally and symmetrically  Psychiatric: Normal mood and affect  Neurologic: Nonfocal  Labs on Admission:  Basic  Metabolic Panel:  Recent Labs Lab 04/26/13 1855  NA 134*  K 4.4  CL 96  CO2 22  GLUCOSE 407*  BUN 23  CREATININE 1.27  CALCIUM 9.8   Liver Function Tests: CBC:  Recent Labs Lab 04/27/13 0106  WBC 12.8*  NEUTROABS 6.5  HGB 14.0  HCT 39.0  MCV 84.2  PLT 290   Cardiac Enzymes:  Recent Labs Lab 04/26/13 1855  TROPONINI <0.30   CBG:  Recent Labs Lab 04/27/13 0026  GLUCAP 274*    Radiological Exams on Admission: Ct Angio Chest W/cm &/or Wo Cm  04/26/2013  *RADIOLOGY REPORT*  Clinical Data: Chest pain.  Mildly elevated D-dimer  CT ANGIOGRAPHY CHEST  Technique:  Multidetector CT imaging of the chest using the standard protocol during bolus administration of intravenous contrast. Multiplanar reconstructed images including MIPs were obtained and reviewed to evaluate the vascular anatomy.  Contrast: OMNIPAQUE IOHEXOL 350 MG/ML SOLN  Comparison: Chest radiograph 04/26/2013  Findings: There are filling defects within the proximal segmental arteries of the left lower lobe pulmonary arteries consistent  with acute thromboembolism.  These emboli are seen on images 122 through 147 of series 9.   No additional pulmonary emboli are identified.  No acute findings of the aorta or great vessels.  No pericardial fluid.  Coronary calcifications are noted.  Review of the lung windows demonstrates no pleural fluid or consolidation.  Airways are normal.  Limited view of the upper abdomen is unremarkable.  Limited view of the skeleton demonstrates mild degenerative spurring.  IMPRESSION:  1.  Acute pulmonary embolism within the proximal segmental branches of the left lower lobe pulmonary arteries. Overall clot burden is mild to moderate.  2.  No acute pulmonary parenchymal findings.  Critical results were conveyed to Dr. Lynelle Doctor on 04/27/2039 at 2230 hours hours   Original Report Authenticated By: Genevive Bi, M.D.    Dg Chest Portable 1 View  04/26/2013  *RADIOLOGY REPORT*  Clinical Data: Chest pain.  Smoker.  PORTABLE CHEST - 1 VIEW  Comparison: One-view chest 05/21/2009.  Findings: The heart size is normal.  Mild prominence of the hila is this table.  No definite adenopathy or mass lesion is present.  The right hemidiaphragm remains somewhat elevated.  IMPRESSION:  1.  Low lung volumes. 2.  Mild prominence of the hila bilaterally.  This is stable without definite adenopathy. 3.  No acute cardiopulmonary disease.   Original Report Authenticated By: Marin Roberts, M.D.     EKG: Independently reviewed. Normal sinus rhythm  Assessment/Plan  42 year old gentleman with newly diagnosed pulmonary embolism, first occurrence, unprovoked but has risk factors of obesity, diabetes and sedentary lifestyle as well as multiple chronic medical problems including severe peripheral neuropathy. Currently he is hemodynamically stable he is not hypotensive and has not had any abnormalities on telemetry.  1. Admit for observation 2. Administer treatment dose Lovenox, bridge to Coumadin 3. Hypercoagulability panel pending. 4.  Wil need a minimum of 3 months of anticoagulation  Code Status: Full code Family Communication: Discussed diagnosis in detail with both the patient and his wife  Disposition Plan: Discharge home this morning with Lovenox and either bridge to Coumadin or treatment with Rivaroxiban/Xaralto.  Time spent: 70 minutes  [facility] Pager [phone]  If 7PM-7AM, please contact night-coverage [email or web address] Password [email or web address] 04/27/2013, 6:09 AM

## 2013-04-27 NOTE — Progress Notes (Deleted)
to the best of my knowledge  

## 2013-04-27 NOTE — Discharge Summary (Signed)
Physician Discharge Summary  Joseph [name] ZOX:[id number] DOB: [birth year] DOA: 04/26/2013  PCP: Egbert Garibaldi, NP  Admit date: 04/26/2013 Discharge date: 04/27/2013  Time spent: 40 minutes  Recommendations for Outpatient Follow-up:  PCP in 1 week. Follow results of hypercoag panel as results pending at discharge.   Discharge Diagnoses:  Principal Problem:    Discharge Condition: stable   Diet recommendation: carb modified  Filed Weights   04/26/13 1827 04/27/13 0140  Weight: 163.862 kg (361 lb 4 oz) 162.6 kg (358 lb 7.5 oz)    History of present illness:  Joseph [name] is a 42 y.o. male with hypertension, type 2 diabetes with complications of severe peripheral neuropathy, coronary artery disease and hyperlipidemia who presented to the emergency department 04/27/13 complaining of chest pain. He felt that he had potentially pulled a muscle in his left upper chest a few days ago but the pain  became more sharp and was associated with worsening shortness of breath and several episodes of intense coughing episodes. He reported being more fatigued than usual over the previous one to 2 weeks, he has chronic leg pain from peripheral neuropathy and significant sensory loss in his bilateral lower extremities from the knee down. He did not report any asymmetric swelling of his lower extremities..  The d-dimer was obtained on initial lab work which was positive. The CT angiogram was obtained which revealed mild to moderate pulmonary emboli. Patient denied any history of immobilization or travel, he was at baseline disabled due to arthritis and age advanced degenerative disc disease. He has no family history of any clotting disorders. He has never had a blood clot in the past. He had not had any trauma to his legs but he also stated that he may have not been aware of an injury to his legs because of his neuropathy.      Hospital Course:  Acute pulmonary embolism: admitted for observation. Risk  factors include obesity, diabetes and multiple chronic medical issues. Hypercoag panel results pending at discharge. To be followed by PCP. Lovenox started. VS remained stable. Pt not hypoxic on room air. No further pain. Will discharge on Xarelto.  Active Problems:   DIABETES MELLITUS, TYPE I: HgA1c result pending at discharge. To resume home regimen.    HYPERLIPIDEMIA: continue home meds   HYPERTENSION: stable during this hospitalization. Continue home meds   CAD: stable at baseline during this hospitalization. Continue asa and home meds   Procedures:  none  Consultations:  none  Discharge Exam: Filed Vitals:   04/26/13 2245 04/27/13 0019 04/27/13 0140 04/27/13 0512  BP: 114/73 112/76 112/75 92/52  Pulse: 92 88 94 81  Temp:   98 F (36.7 C) 97.3 F (36.3 C)  TempSrc:   Oral Oral  Resp: 20 25 22 20   Height:      Weight:   162.6 kg (358 lb 7.5 oz)   SpO2: 96% 97% 100% 95%    General: obese, pleasant NAD Cardiovascular: RRR No MGR No LE edema  Respiratory: normal effort BS slightly coarse but no wheeze  Abdomen: soft +BS non-tender to palpation  Discharge Instructions  Discharge Orders   Future Orders Complete By Expires     Call MD for:  difficulty breathing, headache or visual disturbances  As directed     Call MD for:  persistant dizziness or light-headedness  As directed     Diet Carb Modified  As directed     Discharge instructions  As directed  Comments:      If any unusual bleeding should occur, stop Xarelto and call PCP    Increase activity slowly  As directed        Allergies  Allergen Reactions  . Sulfa Antibiotics Other (See Comments)    headache   Follow-up Information   Follow up with Millsaps, [name], NP. Schedule an appointment as soon as possible for a visit in 1 week.   Contact information:   [facility] [address] [name] [address] [phone]        The results of significant diagnostics from this  hospitalization (including imaging, microbiology, ancillary and laboratory) are listed below for reference.    Significant Diagnostic Studies: Ct Angio Chest W/cm &/or Wo Cm  04/26/2013  *RADIOLOGY REPORT*  Clinical Data: Chest pain.  Mildly elevated D-dimer  CT ANGIOGRAPHY CHEST  Technique:  Multidetector CT imaging of the chest using the standard protocol during bolus administration of intravenous contrast. Multiplanar reconstructed images including MIPs were obtained and reviewed to evaluate the vascular anatomy.  Contrast: OMNIPAQUE IOHEXOL 350 MG/ML SOLN  Comparison: Chest radiograph 04/26/2013  Findings: There are filling defects within the proximal segmental arteries of the left lower lobe pulmonary arteries consistent with acute thromboembolism.  These emboli are seen on images 122 through 147 of series 9.   No additional pulmonary emboli are identified.  No acute findings of the aorta or great vessels.  No pericardial fluid.  Coronary calcifications are noted.  Review of the lung windows demonstrates no pleural fluid or consolidation.  Airways are normal.  Limited view of the upper abdomen is unremarkable.  Limited view of the skeleton demonstrates mild degenerative spurring.  IMPRESSION:  1.  Acute pulmonary embolism within the proximal segmental branches of the left lower lobe pulmonary arteries. Overall clot burden is mild to moderate.  2.  No acute pulmonary parenchymal findings.  Critical results were conveyed to Dr. Lynelle Doctor on 04/27/2039 at 2230 hours hours   Original Report Authenticated By: Genevive Bi, M.D.    Dg Chest Portable 1 View  04/26/2013  *RADIOLOGY REPORT*  Clinical Data: Chest pain.  Smoker.  PORTABLE CHEST - 1 VIEW  Comparison: One-view chest 05/21/2009.  Findings: The heart size is normal.  Mild prominence of the hila is this table.  No definite adenopathy or mass lesion is present.  The right hemidiaphragm remains somewhat elevated.  IMPRESSION:  1.  Low lung volumes. 2.   Mild prominence of the hila bilaterally.  This is stable without definite adenopathy. 3.  No acute cardiopulmonary disease.   Original Report Authenticated By: Marin Roberts, M.D.     Microbiology: No results found for this or any previous visit (from the past 240 hour(s)).   Labs: Basic Metabolic Panel:  Recent Labs Lab 04/26/13 1855  NA 134*  K 4.4  CL 96  CO2 22  GLUCOSE 407*  BUN 23  CREATININE 1.27  CALCIUM 9.8   Liver Function Tests: No results found for this basename: AST, ALT, ALKPHOS, BILITOT, PROT, ALBUMIN,  in the last 168 hours No results found for this basename: LIPASE, AMYLASE,  in the last 168 hours No results found for this basename: AMMONIA,  in the last 168 hours CBC:  Recent Labs Lab 04/27/13 0106  WBC 12.8*  NEUTROABS 6.5  HGB 14.0  HCT 39.0  MCV 84.2  PLT 290   Cardiac Enzymes:  Recent Labs Lab 04/26/13 1855  TROPONINI <0.30   BNP: BNP (  last 3 results) No results found for this basename: PROBNP,  in the last 8760 hours CBG:  Recent Labs Lab 04/27/13 0026 04/27/13 0726 04/27/13 0910  GLUCAP 274* 289* 350*   Discharge meds: Xarelto 15mg  po BID for 30 days Xarelto 20mg  po daily starting 05/28/13 Alprazolam 2mg  po at bedtime Amlodipine 2.5mg  daily Bupropion 150mg  po BID Diclofenac 75mg  daily as needed for ithching sominex 50-75mg  daily as needed for allergies fenofibraed 145mg  daily Fluconazole 200mg  po weekly Fluticasone 220 MCG/ACT inhaler 1 puff BID gabapendtin 300mg  daily advil 1200mg  po q6hrs prn pain Insulin novolog TID with meals Lantus 30-50 unitis SQ BID. atrovent nebulizer Lisinopril-HCTZ 20-12.5mg  po daily Metformin 1000mg  po BID Oxycodone 15mg  po QID Potassium gluconate 595mg  BID proair HFA 108MCG/ACT inhaler 2 puffs q6hrs prn Phenergan 25mg  po every 6hrs prn nausea      Signed:  Gwenyth Bender  [facility] 04/27/2013, 10:40 AM Attending: Patient seen and examined, but no reviewed. I  think that pulmonary embolism is related to morbid obesity, relative inactivity, tobacco abuse. I've counseled the patient against tobacco abuse and also encouraged water intake and exercise. He will need to follow with his primary care physician in the next week or 2. Gibson Ramp is an appropriate anticoagulation medication in this situation.

## 2013-04-27 NOTE — Progress Notes (Signed)
UR chart review completed.  

## 2013-04-27 NOTE — Progress Notes (Signed)
Inpatient Diabetes Program Recommendations  AACE/ADA: New Consensus Statement on Inpatient Glycemic Control (2013)  Target Ranges:  Prepandial:   less than 140 mg/dL      Peak postprandial:   less than 180 mg/dL (1-2 hours)      Critically ill patients:  140 - 180 mg/dL   Results for Joseph [name], Joseph [name] (MRN [id number]) as of 04/27/2013 08:56  Ref. Range 04/27/2013 00:26 04/27/2013 07:26  Glucose-Capillary Latest Range: 70-99 mg/dL 147 (H) 829 (H)    Inpatient Diabetes Program Recommendations Insulin - Basal: Please consider ordering basal insulin.  Patient takes Lantus 30 units QAM and Lantus 50 units QHS at home.  Note: Patient has a history of diabetes and takes Lantus 30 units QAM, Lantus 50 units QHS, Novolog (units not documented in chart) TID with meals, and Metformin 1000 mg BID at home for diabetes management.  Currently, patient is ordered to receive Novolog 0-15 units AC and Novolog 4 units TID meal coverage for inpatient glycemic control.  Patient received a one time dose of Lantus 50 units at 00:45 on 04/27/13.  Please consider ordering basal insulin as patient takes it at home.  Will continue to follow as an inpatient.  Thanks, Orlando Penner, RN, BSN, CCRN Diabetes Coordinator Inpatient Diabetes Program [phone]

## 2013-04-27 NOTE — Progress Notes (Signed)
ANTICOAGULATION CONSULT NOTE - Initial Consult  Pharmacy Consult for Lovenox Indication: pulmonary embolus  Allergies  Allergen Reactions  . Sulfa Antibiotics Other (See Comments)    headache   Patient Measurements: Height: 6\' 4"  (193 cm) Weight: 358 lb 7.5 oz (162.6 kg) IBW/kg (Calculated) : 86.8  Vital Signs: Temp: 97.3 F (36.3 C) (05/09 0512) Temp src: Oral (05/09 0512) BP: 92/52 mmHg (05/09 0512) Pulse Rate: 81 (05/09 0512)  Labs:  Recent Labs  04/26/13 1855 04/26/13 1950 04/27/13 0106  HGB  --   --  14.0  HCT  --   --  39.0  PLT  --   --  290  APTT  --  30  --   LABPROT  --  13.0  --   INR  --  0.99  --   CREATININE 1.27  --   --   TROPONINI <0.30  --   --     Estimated Creatinine Clearance: 128.1 ml/min (by C-G formula based on Cr of 1.27).  Medical History: Past Medical History  Diagnosis Date  . CAD (coronary artery disease)   . Hypertension   . Hyperlipidemia   . Diabetes mellitus    Medications:  Scheduled:  . albuterol  2.5 mg Nebulization Once  . [COMPLETED] albuterol  5 mg Nebulization Once  . ALPRAZolam  2 mg Oral QHS  . buPROPion  150 mg Oral BID  . [COMPLETED] enoxaparin  150 mg Subcutaneous Once  . enoxaparin  150 mg Subcutaneous Q12H  . fenofibrate  160 mg Oral Daily  . fluticasone  1 puff Inhalation BID  . gabapentin  300 mg Oral q morning - 10a  . insulin aspart  0-15 Units Subcutaneous TID WC  . insulin aspart  4 Units Subcutaneous TID WC  . [COMPLETED] insulin glargine  50 Units Subcutaneous Once  . [COMPLETED] ipratropium  0.5 mg Nebulization Once    Assessment: 42yo male admitted for PE.  Pt has no h/o blood clots in the past.  Pt is morbidly obese with good renal fxn.  Estimated Creatinine Clearance: 128.1 ml/min (by C-G formula based on Cr of 1.27).  Goal of Therapy:  Anti-Xa level 0.6-1.2 units/ml 4hrs after LMWH dose given Monitor platelets by anticoagulation protocol: Yes   Plan:  Lovenox 150mg  SQ q12hrs (~ 1mg /Kg  per dose) Check LMWH level at steady state due to obesity Monitor labs, renal fxn, and CBC F/U plans to transition to oral anticoagulation (Coumadin vs Xarelto) per MD  Valrie Hart A 04/27/2013,8:32 AM

## 2013-04-28 NOTE — Progress Notes (Signed)
Pt and her wife verbalize understanding of discharge instructions, prescriptions and follow up information. IV d/c. Pt d/c via wheelchair. No questions at this time. Sheryn Bison

## 2013-04-30 LAB — CARDIOLIPIN ANTIBODIES, IGG, IGM, IGA
Anticardiolipin IgA: 12 APL U/mL — ABNORMAL LOW (ref <22)
Anticardiolipin IgG: 2 GPL U/mL — ABNORMAL LOW (ref <23)
Anticardiolipin IgM: 2 MPL U/mL — ABNORMAL LOW (ref <11)

## 2013-04-30 LAB — FACTOR 5 LEIDEN

## 2013-04-30 LAB — BETA-2-GLYCOPROTEIN I ABS, IGG/M/A
Beta-2 Glyco I IgG: 10 G Units (ref <20)
Beta-2-Glycoprotein I IgA: 127 A Units — ABNORMAL HIGH (ref <20)
Beta-2-Glycoprotein I IgM: 0 M Units (ref <20)

## 2013-04-30 LAB — PROTEIN C, TOTAL: Protein C, Total: 116 % (ref 72–160)

## 2013-04-30 LAB — PROTEIN S, TOTAL: Protein S Ag, Total: 121 % (ref 60–150)

## 2013-05-01 LAB — LUPUS ANTICOAGULANT PANEL
DRVVT: 37.6 secs (ref <42.9)
Lupus Anticoagulant: NOT DETECTED
PTT Lupus Anticoagulant: 53.4 secs — ABNORMAL HIGH (ref 28.0–43.0)
PTTLA 4:1 Mix: 46.6 secs — ABNORMAL HIGH (ref 28.0–43.0)
PTTLA Confirmation: 0 secs (ref <8.0)

## 2013-05-01 LAB — PROTEIN C ACTIVITY: Protein C Activity: 200 % — ABNORMAL HIGH (ref 75–133)

## 2013-05-01 LAB — PROTEIN S ACTIVITY: Protein S Activity: 138 % — ABNORMAL HIGH (ref 69–129)

## 2013-05-08 ENCOUNTER — Encounter: Payer: Self-pay | Admitting: Neurology

## 2013-05-08 ENCOUNTER — Ambulatory Visit (INDEPENDENT_AMBULATORY_CARE_PROVIDER_SITE_OTHER): Payer: Medicare Other | Admitting: Neurology

## 2013-05-08 VITALS — BP 119/76 | HR 103 | Temp 99.0°F | Ht 76.0 in | Wt 368.0 lb

## 2013-05-08 DIAGNOSIS — Z9181 History of falling: Secondary | ICD-10-CM

## 2013-05-08 DIAGNOSIS — G609 Hereditary and idiopathic neuropathy, unspecified: Secondary | ICD-10-CM

## 2013-05-08 DIAGNOSIS — G4733 Obstructive sleep apnea (adult) (pediatric): Secondary | ICD-10-CM

## 2013-05-08 DIAGNOSIS — R51 Headache: Secondary | ICD-10-CM

## 2013-05-08 DIAGNOSIS — R296 Repeated falls: Secondary | ICD-10-CM

## 2013-05-08 DIAGNOSIS — R42 Dizziness and giddiness: Secondary | ICD-10-CM

## 2013-05-08 DIAGNOSIS — G44309 Post-traumatic headache, unspecified, not intractable: Secondary | ICD-10-CM

## 2013-05-08 DIAGNOSIS — R269 Unspecified abnormalities of gait and mobility: Secondary | ICD-10-CM

## 2013-05-08 NOTE — Patient Instructions (Addendum)
I do want to suggest a few things today:  Your memory issues and headaches and balance and gait problems are likely a function of multiple issues: Severe neuropathy, possible concussion, severe arthritis and obesity.   Remember to drink plenty of fluid, eat healthy meals and do not skip any meals. Try to eat protein with a every meal and eat a healthy snack such as fruit or nuts in between meals. Try to keep a regular sleep-wake schedule and try to exercise daily, particularly in the form of walking, 20-30 minutes a day, if you can.   As far as your medications are concerned, I would like to suggest no new changes, but try to reduce the use of narcotic pain medication.    As far as diagnostic testing: You have a history of sleep apnea and should be restarted on CPAP. I recommend a sleep study. I will see you back after your sleep study to go over the test results and where to go from there. We will call you after your sleep study and to set up an appointment at the time. I will order Physical therapy.  I would like to see you back after the sleep study.   Please also call us for any test results so we can go over those with you on the phone. Brett Canales is my clinical assistant and will answer any of your questions and relay your messages to me and also relay most of my messages to you.  Our phone number is [phone]. We also have an after hours call service for urgent matters and there is a physician on-call for urgent questions. For any emergencies you know to call 911 or go to the nearest emergency room.   You need to stop smoking, you need to lose weight. You need to keep very tight glucose control.
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
## 2013-05-28 NOTE — Progress Notes (Signed)
Quick Note:  Normal Brain MRI from outside facility [facility]. ______

## 2013-05-30 NOTE — Progress Notes (Signed)
Quick Note:  Left a message on the pt's home voice mail regarding his recent MRI being within normal limits. Contact information was given so that he may call with any questions or concerns.   ______ 

## 2013-06-07 ENCOUNTER — Ambulatory Visit: Payer: Medicare Other | Attending: Neurology | Admitting: Sleep Medicine

## 2013-06-07 DIAGNOSIS — R269 Unspecified abnormalities of gait and mobility: Secondary | ICD-10-CM

## 2013-06-07 DIAGNOSIS — R42 Dizziness and giddiness: Secondary | ICD-10-CM

## 2013-06-07 DIAGNOSIS — R296 Repeated falls: Secondary | ICD-10-CM

## 2013-06-07 DIAGNOSIS — G4761 Periodic limb movement disorder: Secondary | ICD-10-CM | POA: Insufficient documentation

## 2013-06-07 DIAGNOSIS — G4733 Obstructive sleep apnea (adult) (pediatric): Secondary | ICD-10-CM | POA: Insufficient documentation

## 2013-06-07 DIAGNOSIS — G44309 Post-traumatic headache, unspecified, not intractable: Secondary | ICD-10-CM

## 2013-06-20 ENCOUNTER — Other Ambulatory Visit: Payer: Self-pay | Admitting: Neurology

## 2013-06-20 DIAGNOSIS — G4733 Obstructive sleep apnea (adult) (pediatric): Secondary | ICD-10-CM

## 2013-06-20 NOTE — Progress Notes (Signed)
Please call and notify the patient that the recent sleep study did confirm the diagnosis of obstructive sleep apnea and that I recommend treatment for this in the form of CPAP. I reviewed the sleep study done at [facility] and placed an order for CPAP at 11 cm, based on their recs. Pls arrange for FU with me in 6-8 weeks, Thanks, Huston Foley, MD, PhD [facility] ([facility]

## 2013-06-21 ENCOUNTER — Telehealth: Payer: Self-pay | Admitting: *Deleted

## 2013-06-21 NOTE — Telephone Encounter (Signed)
Called patient to discuss sleep study results.  Discussed findings, recommendations and follow up care.  Patient understood well and all questions were answered.   Orders for CPAP will be forwarded to [facility] today.  He understands he will hear from them next.    Follow up appt was scheduled for 08/22/2013 at 1:30 PM, he requested an appt after 1:00 due to the time it takes him to travel to [place].  Also requested an appt at first of month as he is only paid monthly.

## 2013-07-03 NOTE — Procedures (Signed)
[facility] Briscoe Daniello A. Gerilyn Pilgrim, MD     [email or web address]        NAME:  Joseph [name], MOHS               ACCOUNT NO.:  [id number]  MEDICAL RECORD NO.:  [id number]         PATIENT TYPE:  OUT  LOCATION:  SLEEP LAB                     FACILITY:  [facility]  PHYSICIAN:  Davette Nugent A. Gerilyn Pilgrim, M.D. DATE OF BIRTH:  01-03-1971  DATE OF STUDY:                           NOCTURNAL POLYSOMNOGRAM   INDICATION:  A 42 year old, who presents with hypersomnia, fatigue, obesity, and snoring.   MEDICATIONS:  Insulin, Flonase, gabapentin, ibuprofen, oxycodone, potassium, metformin, hydrochlorothiazide, lisinopril, Atrovent, methocarbamol, aspirin, Rivaroxaban.  EPWORTH SLEEPINESS SCALE:  18.  BMI:  44.  ARCHITECTURAL SUMMARY:  This is a split-night recording with initial portion being a diagnostic and the second portion a titration recording. The total recording time is 421 minutes, sleep efficiency 94%.  Sleep latency 2 minutes.  REM latency is 74 minutes.  RESPIRATORY SUMMARY:  Baseline oxygen saturation is 97, lowest saturation is 76 during non-REM sleep.  Diagnostic AHI is 36.  The patient was placed on positive pressure between 6 and 12 optimal pressure.  LIMB MOVEMENT SUMMARY:  PLM index is 25.  ELECTROCARDIOGRAM SUMMARY:  Average heart rate is 87 with no significant dysrhythmias observed.  IMPRESSION: 1. Moderate to severe obstructive sleep apnea syndrome which responds     well to a CPAP of 11. 2. Moderate periodic limb movement disorder of sleep.  The patient     will be placed on a CPAP of 11.  Thank you for this referral.     Tatem Holsonback A. Gerilyn Pilgrim, M.D.    KAD/MEDQ  D:  06/15/2013 09:39:21  T:  06/15/2013 10:09:51  Job:  [id number]

## 2013-08-15 ENCOUNTER — Emergency Department (HOSPITAL_COMMUNITY): Payer: Medicare Other

## 2013-08-15 ENCOUNTER — Encounter (HOSPITAL_COMMUNITY): Payer: Self-pay

## 2013-08-15 ENCOUNTER — Other Ambulatory Visit: Payer: Self-pay

## 2013-08-15 ENCOUNTER — Emergency Department (HOSPITAL_COMMUNITY)
Admission: EM | Admit: 2013-08-15 | Discharge: 2013-08-15 | Disposition: A | Discharge status: Home / Self Care | Payer: Medicare Other | Attending: Emergency Medicine | Admitting: Emergency Medicine

## 2013-08-15 DIAGNOSIS — R079 Chest pain, unspecified: Secondary | ICD-10-CM

## 2013-08-15 DIAGNOSIS — Z8639 Personal history of other endocrine, nutritional and metabolic disease: Secondary | ICD-10-CM | POA: Insufficient documentation

## 2013-08-15 DIAGNOSIS — I10 Essential (primary) hypertension: Secondary | ICD-10-CM | POA: Insufficient documentation

## 2013-08-15 DIAGNOSIS — Z79899 Other long term (current) drug therapy: Secondary | ICD-10-CM | POA: Insufficient documentation

## 2013-08-15 DIAGNOSIS — F17200 Nicotine dependence, unspecified, uncomplicated: Secondary | ICD-10-CM | POA: Insufficient documentation

## 2013-08-15 DIAGNOSIS — IMO0002 Reserved for concepts with insufficient information to code with codable children: Secondary | ICD-10-CM | POA: Insufficient documentation

## 2013-08-15 DIAGNOSIS — R519 Headache, unspecified: Secondary | ICD-10-CM

## 2013-08-15 DIAGNOSIS — E860 Dehydration: Secondary | ICD-10-CM

## 2013-08-15 DIAGNOSIS — Z794 Long term (current) use of insulin: Secondary | ICD-10-CM | POA: Insufficient documentation

## 2013-08-15 DIAGNOSIS — E119 Type 2 diabetes mellitus without complications: Secondary | ICD-10-CM | POA: Insufficient documentation

## 2013-08-15 DIAGNOSIS — R51 Headache: Secondary | ICD-10-CM | POA: Insufficient documentation

## 2013-08-15 DIAGNOSIS — Z7982 Long term (current) use of aspirin: Secondary | ICD-10-CM | POA: Insufficient documentation

## 2013-08-15 DIAGNOSIS — Z862 Personal history of diseases of the blood and blood-forming organs and certain disorders involving the immune mechanism: Secondary | ICD-10-CM | POA: Insufficient documentation

## 2013-08-15 DIAGNOSIS — I2510 Atherosclerotic heart disease of native coronary artery without angina pectoris: Secondary | ICD-10-CM | POA: Insufficient documentation

## 2013-08-15 LAB — BASIC METABOLIC PANEL
BUN: 35 mg/dL — ABNORMAL HIGH (ref 6–23)
CO2: 23 mEq/L (ref 19–32)
Calcium: 9.9 mg/dL (ref 8.4–10.5)
Chloride: 97 mEq/L (ref 96–112)
Creatinine, Ser: 1.48 mg/dL — ABNORMAL HIGH (ref 0.50–1.35)
GFR calc Af Amer: 67 mL/min — ABNORMAL LOW (ref >90)
GFR calc non Af Amer: 58 mL/min — ABNORMAL LOW (ref >90)
Glucose, Bld: 368 mg/dL — ABNORMAL HIGH (ref 70–99)
Potassium: 4.7 mEq/L (ref 3.5–5.1)
Sodium: 132 mEq/L — ABNORMAL LOW (ref 135–145)

## 2013-08-15 LAB — PROTIME-INR
INR: 1.76 — ABNORMAL HIGH (ref 0.00–1.49)
Prothrombin Time: 20 seconds — ABNORMAL HIGH (ref 11.6–15.2)

## 2013-08-15 LAB — CBC WITH DIFFERENTIAL/PLATELET
Basophils Absolute: 0.1 10*3/uL (ref 0.0–0.1)
Basophils Relative: 0 % (ref 0–1)
Eosinophils Absolute: 0.3 10*3/uL (ref 0.0–0.7)
Eosinophils Relative: 2 % (ref 0–5)
HCT: 38.8 % — ABNORMAL LOW (ref 39.0–52.0)
Hemoglobin: 13.3 g/dL (ref 13.0–17.0)
Lymphocytes Relative: 30 % (ref 12–46)
Lymphs Abs: 3.4 10*3/uL (ref 0.7–4.0)
MCH: 29.3 pg (ref 26.0–34.0)
MCHC: 34.3 g/dL (ref 30.0–36.0)
MCV: 85.5 fL (ref 78.0–100.0)
Monocytes Absolute: 0.8 10*3/uL (ref 0.1–1.0)
Monocytes Relative: 7 % (ref 3–12)
Neutro Abs: 7 10*3/uL (ref 1.7–7.7)
Neutrophils Relative %: 61 % (ref 43–77)
Platelets: 295 10*3/uL (ref 150–400)
RBC: 4.54 MIL/uL (ref 4.22–5.81)
RDW: 12.5 % (ref 11.5–15.5)
WBC: 11.5 10*3/uL — ABNORMAL HIGH (ref 4.0–10.5)

## 2013-08-15 LAB — APTT: aPTT: 42 seconds — ABNORMAL HIGH (ref 24–37)

## 2013-08-15 LAB — CK: Total CK: 485 U/L — ABNORMAL HIGH (ref 7–232)

## 2013-08-15 LAB — TROPONIN I
Troponin I: <0.3 ng/mL (ref <0.30)
Troponin I: <0.3 ng/mL (ref <0.30)

## 2013-08-15 MED ORDER — SODIUM CHLORIDE 0.9 % IV BOLUS (SEPSIS)
1000.0000 mL | Freq: Once | INTRAVENOUS | Status: AC
  Administered 2013-08-15: 1000 mL via INTRAVENOUS

## 2013-08-15 NOTE — ED Notes (Signed)
Per ems, pt reports working outside all day and began to have some chest pain.  Pt reports taking 243mg  asa, 2 SL nitro tablets, and 2 1mg  xanax prior to ems arrival.  Pt denies any sob, n/v, or dizziness.

## 2013-08-15 NOTE — ED Notes (Signed)
Patient placed on 4 liters of oxygen via nasal canula. Oxygen saturation dropped to 82% while patient was sleeping. Oxygen saturation came up to 88% with placement of oxygen via nasal canula at 2 liters. Oxygen saturation up to 96% with 4 liters via nasal canula. Per wife patient has history of sleep apnea. MD aware.

## 2013-08-15 NOTE — ED Provider Notes (Addendum)
CSN: [id number]     Arrival date & time 08/15/13  1818 History   First MD Initiated Contact with Patient 08/15/13 1826     Chief Complaint  Patient presents with  . Chest Pain   (Consider location/radiation/quality/duration/timing/severity/associated sxs/prior Treatment) Patient is a 42 y.o. male presenting with chest pain.  Chest Pain  Pt with history of CAD and recently diagnosed with PE is currently on maintenance Xarelto reports he has been working in the yard for extended period the last 2 days, developed a severe diffuse throbbing headache last night which has continued through the day today. He has had mild-moderate chest pain persistently since diagnosis of PE which was worse today and also associated with SOB. He states 'felt like truck sitting on his chest'. He was given 325mg  ASA and 2 NTG prior to arrival by wife. Some improvement in CP, now mostly complaining of headache.    Past Medical History  Diagnosis Date  . CAD (coronary artery disease)   . Hypertension   . Hyperlipidemia   . Diabetes mellitus    Past Surgical History  Procedure Laterality Date  . Knee arthoplasty    . Carpel tunnel     No family history on file. History  Substance Use Topics  . Smoking status: Current Every Day Smoker -- 1.50 packs/day    Types: Cigarettes  . Smokeless tobacco: Not on file  . Alcohol Use: Yes     Comment: occasional    Review of Systems  Cardiovascular: Positive for chest pain.   All other systems reviewed and are negative except as noted in HPI.   Allergies  Sulfa antibiotics  Home Medications   Current Outpatient Rx  Name  Route  Sig  Dispense  Refill  . albuterol (PROAIR HFA) 108 (90 BASE) MCG/ACT inhaler   Inhalation   Inhale 2 puffs into the lungs every 6 (six) hours as needed.           . ALPRAZolam (XANAX) 1 MG tablet   Oral   Take 2 mg by mouth at bedtime.          Marland Kitchen amLODipine (NORVASC) 2.5 MG tablet   Oral   Take 2.5 mg by mouth every  morning.          Marland Kitchen aspirin 81 MG EC tablet   Oral   Take 1 tablet (81 mg total) by mouth daily. Swallow whole.   30 tablet   12   . buPROPion (WELLBUTRIN XL) 150 MG 24 hr tablet   Oral   Take 150 mg by mouth 2 (two) times daily.           . diclofenac (VOLTAREN) 75 MG EC tablet   Oral   Take 75 mg by mouth 2 (two) times daily.         . diphenhydrAMINE (SOMINEX) 25 MG tablet   Oral   Take 50-75 mg by mouth daily as needed for itching or allergies.          . fenofibrate (TRICOR) 145 MG tablet   Oral   Take 145 mg by mouth every morning.          . fluconazole (DIFLUCAN) 200 MG tablet   Oral   Take 200 mg by mouth once a week. *Take on Monday mornings*         . fluticasone (FLOVENT HFA) 220 MCG/ACT inhaler   Inhalation   Inhale 1 puff into the lungs 2 (two) times daily.           Marland Kitchen  gabapentin (NEURONTIN) 300 MG capsule   Oral   Take 300 mg by mouth every morning.         Marland Kitchen HUMALOG KWIKPEN 100 UNIT/ML SOPN               . ibuprofen (ADVIL,MOTRIN) 200 MG tablet   Oral   Take 1,200 mg by mouth every 6 (six) hours as needed for pain.          Marland Kitchen insulin glargine (LANTUS) 100 UNIT/ML injection   Subcutaneous   Inject 30-50 Units into the skin 2 (two) times daily. 30 units injected in the morning and 50 units at bedtime         . lisinopril-hydrochlorothiazide (PRINZIDE,ZESTORETIC) 20-12.5 MG per tablet   Oral   Take 1 tablet by mouth every morning.         . meclizine (ANTIVERT) 25 MG tablet               . metFORMIN (GLUCOPHAGE) 1000 MG tablet   Oral   Take 1,000 mg by mouth 2 (two) times daily with a meal.           . omeprazole (PRILOSEC) 40 MG capsule               . oxyCODONE (ROXICODONE) 15 MG immediate release tablet   Oral   Take 15 mg by mouth 4 (four) times daily.         . Potassium Gluconate 595 MG CAPS   Oral   Take 1 capsule by mouth 2 (two) times daily.          . promethazine (PHENERGAN) 25 MG  tablet   Oral   Take 25 mg by mouth every 6 (six) hours as needed for nausea.          Marland Kitchen EXPIRED: Rivaroxaban (XARELTO) 15 MG TABS tablet   Oral   Take 1 tablet (15 mg total) by mouth 2 (two) times daily.   60 tablet   0   . Rivaroxaban (XARELTO) 20 MG TABS   Oral   Take 1 tablet (20 mg total) by mouth daily.   30 tablet   1    BP 92/65  Pulse 101  Temp(Src) 97.7 F (36.5 C) (Oral)  Resp 15  SpO2 95% Physical Exam  Nursing note and vitals reviewed. Constitutional: He is oriented to person, place, and time. He appears well-developed and well-nourished.  HENT:  Head: Normocephalic and atraumatic.  Eyes: EOM are normal. Pupils are equal, round, and reactive to light.  Neck: Normal range of motion. Neck supple.  Cardiovascular: Normal rate, normal heart sounds and intact distal pulses.   Pulmonary/Chest: Effort normal and breath sounds normal.  Abdominal: Bowel sounds are normal. He exhibits no distension. There is no tenderness.  Musculoskeletal: Normal range of motion. He exhibits no edema and no tenderness.  Neurological: He is alert and oriented to person, place, and time. He has normal strength. No cranial nerve deficit or sensory deficit.  Skin: Skin is warm and dry. No rash noted.  Psychiatric: He has a normal mood and affect.    ED Course  Procedures (including critical care time) Labs Review Labs Reviewed  CBC WITH DIFFERENTIAL - Abnormal; Notable for the following:    WBC 11.5 (*)    HCT 38.8 (*)    All other components within normal limits  BASIC METABOLIC PANEL - Abnormal; Notable for the following:    Sodium 132 (*)    Glucose, Bld  368 (*)    BUN 35 (*)    Creatinine, Ser 1.48 (*)    GFR calc non Af Amer 58 (*)    GFR calc Af Amer 67 (*)    All other components within normal limits  PROTIME-INR - Abnormal; Notable for the following:    Prothrombin Time 20.0 (*)    INR 1.76 (*)    All other components within normal limits  APTT - Abnormal; Notable  for the following:    aPTT 42 (*)    All other components within normal limits  CK - Abnormal; Notable for the following:    Total CK 485 (*)    All other components within normal limits  TROPONIN I  TROPONIN I   Imaging Review Dg Chest 2 View  08/15/2013   *RADIOLOGY REPORT*  Clinical Data: Left-sided chest pain and shortness of breath.  CHEST - 2 VIEW  Comparison: 04/26/2013  Findings: The lungs are clear and show no evidence of edema, infiltrate or nodule.  No pleural fluid is identified.  No pneumothorax is seen.  The heart size and mediastinal contours are within normal limits.  Visualized bony structures are unremarkable.  IMPRESSION: No active disease.   Original Report Authenticated By: Irish Lack, M.D.   Ct Head Wo Contrast  08/15/2013   *RADIOLOGY REPORT*  Clinical Data: Headache.  The patient is on chronic anticoagulant therapy.  CT HEAD WITHOUT CONTRAST  Technique:  Contiguous axial images were obtained from the base of the skull through the vertex without contrast.  Comparison: 08/19/2005  Findings: The brain demonstrates no evidence of hemorrhage, infarction, edema, mass effect, extra-axial fluid collection, hydrocephalus or mass lesion.  The skull is unremarkable.  IMPRESSION: Normal head CT.   Original Report Authenticated By: Irish Lack, M.D.    MDM   1. Chest pain   2. Headache   3. Dehydration       Date: 08/15/2013  Rate: 98  Rhythm: normal sinus rhythm  QRS Axis: left  Intervals: normal  ST/T Wave abnormalities: normal  Conduction Disutrbances:nonspecific intraventricular conduction delay  Narrative Interpretation:   Old EKG Reviewed: unchanged  Labs and imaging reviewed. Pt with mild dehydration by BUN/Cr. Sleeping soundly now. Doubt this is ACS, but will check a repeat Troponin. Has had pain since early this afternoon. Plan discharge if negative. Pt and wife amenable to that plan.     Charles B. Bernette Mayers, MD 08/15/13 2153  Bonnita Levan. Bernette Mayers,  MD 08/15/13 2153

## 2013-08-16 ENCOUNTER — Encounter: Payer: Self-pay | Admitting: Neurology

## 2013-08-22 ENCOUNTER — Other Ambulatory Visit: Payer: Self-pay | Admitting: *Deleted

## 2013-08-22 ENCOUNTER — Institutional Professional Consult (permissible substitution): Payer: Medicare Other | Admitting: Neurology

## 2013-08-22 ENCOUNTER — Telehealth: Payer: Self-pay | Admitting: *Deleted

## 2013-08-22 NOTE — Telephone Encounter (Signed)
Called patient left voicemail letting him know his appointment has been cancelled and to call back to reschedule.

## 2013-08-22 NOTE — Progress Notes (Signed)
Quick Note:  I reviewed the patient's CPAP compliance data from 07/02/13 to 07/31/13, which is a total of 30 days, during which time the patient used CPAP every day except for 2 days. The average usage for all days was 4 hours and 19 minutes. The percent used days greater than 4 hours was 60%, indicating fair compliance. The residual AHI was high at 23.7 per hour, indicating an inadequate treatment pressure of 11 cwp with EPR of 3. I will review this data with the patient at the next visit, provide feedback and additional trouble shooting if need be.  Huston Foley, MD, PhD [facility] ([facility]   ______

## 2013-08-23 ENCOUNTER — Encounter: Payer: Self-pay | Admitting: Neurology

## 2013-08-24 ENCOUNTER — Encounter: Payer: Self-pay | Admitting: Neurology

## 2013-09-13 ENCOUNTER — Encounter: Payer: Self-pay | Admitting: Neurology

## 2013-09-13 ENCOUNTER — Ambulatory Visit (INDEPENDENT_AMBULATORY_CARE_PROVIDER_SITE_OTHER): Payer: Medicare Other | Admitting: Neurology

## 2013-09-13 VITALS — BP 116/76 | HR 114 | Temp 98.8°F | Ht 76.0 in | Wt 351.0 lb

## 2013-09-13 DIAGNOSIS — R51 Headache: Secondary | ICD-10-CM

## 2013-09-13 DIAGNOSIS — G4733 Obstructive sleep apnea (adult) (pediatric): Secondary | ICD-10-CM

## 2013-09-13 DIAGNOSIS — R269 Unspecified abnormalities of gait and mobility: Secondary | ICD-10-CM

## 2013-09-13 DIAGNOSIS — R42 Dizziness and giddiness: Secondary | ICD-10-CM

## 2013-09-13 DIAGNOSIS — G609 Hereditary and idiopathic neuropathy, unspecified: Secondary | ICD-10-CM

## 2013-09-13 DIAGNOSIS — G44309 Post-traumatic headache, unspecified, not intractable: Secondary | ICD-10-CM

## 2013-09-13 NOTE — Patient Instructions (Addendum)
Please continue using your CPAP regularly. While your insurance requires that you use CPAP at least 4 hours each night on 70% of the nights, I recommend, that you not skip any nights and use it throughout the night if you can. Getting used to CPAP does take time and patience and discipline. Untreated obstructive sleep apnea when it is moderate to severe can have an adverse impact on cardiovascular health and raise her risk for heart disease, arrhythmias, hypertension, congestive heart failure, stroke and diabetes. Untreated obstructive sleep apnea causes sleep disruption, nonrestorative sleep, and sleep deprivation. This can have an impact on your day to day functioning and cause daytime sleepiness and impairment of cognitive function, memory loss, mood disturbance, and problems focussing. Using CPAP regularly can improve these symptoms.  I will increase your CPAP pressure to 12 cm. Please do not use any heavy machinery or dangerous equipment.

## 2013-09-13 NOTE — Progress Notes (Addendum)
Subjective:    Patient ID: Joseph [name] is a 42 y.o. male.  HPI  Interim history:   Joseph [name] is a very pleasant 42 year old right-handed gentleman with an underlying medical history of diabetes, hyperlipidemia, hypertension, obesity, arthritis, chronic back pain and anxiety, who presents for followup consultation of his obstructive sleep apnea, his dizziness, his headaches and his neuropathy. I first met him on 05/08/2013, at which time he reported intermittent lightheadedness and vertiginous symptoms since October 2013. His physical exam is consistent with neuropathy, likely diabetic and he also had reported a concussion and I felt he may have some postconcussive symptoms. He was recently diagnosed with a PE, now on Xarelto, and we talked about vascular disease prevention and based on his prior diagnosis of OSA I felt he needed to be reevaluated with a sleep study. He had a sleep study at [facility] which was read by Dr. Gerilyn Pilgrim. I reviewed his test results, it showed an AHI of 36 per hour with an oxyhemoglobin desaturation nadir of 76%. He was titrated on CPAP between 6-12 cm of pressure and did well on CPAP of 11. He was started on CPAP of 11 cm and I also reviewed compliance data from 07/02/2013 to 07/31/2013 which is a total of 30 days during which time he used in 28 days. His percent used days greater than 4 hours was 18 days which is 60% indicating fair compliance. His average usage for all days was 4 hours and 19 minutes. His residual AHI was high at 23.7 per hour at a pressure of 11 with EPR level of 3. In the interim, he went to the emergency room on 08/15/2013 with a history of headaches and chest pains. He was tested negative for acute coronary syndrome. He had plain head CT which was negative. He has not been able to wear his CPAP because of cough and he is on ABx by his PCP. He has had some issues with leaking, had a mask fit with his DME company, [employer]. He has  had no changes in his history otherwise. His dizziness and HAs are about the same.  He endorses stress and difficulty initiating sleep.   His Past Medical History Is Significant For: Past Medical History  Diagnosis Date  . CAD (coronary artery disease)   . Hypertension   . Hyperlipidemia   . Diabetes mellitus     His Past Surgical History Is Significant For: Past Surgical History  Procedure Laterality Date  . Knee arthoplasty    . Carpel tunnel      His Family History Is Significant For: No family history on file.  His Social History Is Significant For: History   Social History  . Marital Status: Married    Spouse Name: N/A    Number of Children: N/A  . Years of Education: N/A   Occupational History  . disabled     Social History Main Topics  . Smoking status: Current Every Day Smoker -- 1.50 packs/day    Types: Cigarettes  . Smokeless tobacco: None  . Alcohol Use: Yes     Comment: occasional  . Drug Use: No  . Sexual Activity: None   Other Topics Concern  . None   Social History Narrative  . None    His Allergies Are:  Allergies  Allergen Reactions  . Sulfa Antibiotics Other (See Comments)    headache  :   His Current Medications Are:  Outpatient Encounter Prescriptions as of 09/13/2013  Medication Sig Dispense Refill  . acetaminophen (TYLENOL) 500 MG tablet Take 500 mg by mouth every 6 (six) hours as needed for pain.      Marland Kitchen albuterol (PROAIR HFA) 108 (90 BASE) MCG/ACT inhaler Inhale 2 puffs into the lungs every 6 (six) hours as needed.        . ALPRAZolam (XANAX) 1 MG tablet Take 2 mg by mouth daily as needed for sleep or anxiety.       Marland Kitchen amLODipine (NORVASC) 2.5 MG tablet Take 2.5 mg by mouth every morning.       Marland Kitchen aspirin 81 MG EC tablet Take 1 tablet (81 mg total) by mouth daily. Swallow whole.  30 tablet  12  . buPROPion (WELLBUTRIN XL) 150 MG 24 hr tablet Take 150 mg by mouth 2 (two) times daily.        . carisoprodol (SOMA) 350 MG tablet Take  350 mg by mouth 3 (three) times daily as needed for muscle spasms (for  back spasms).      . diclofenac (VOLTAREN) 75 MG EC tablet Take 75 mg by mouth 2 (two) times daily.      . fenofibrate (TRICOR) 145 MG tablet Take 145 mg by mouth every morning.       . fluconazole (DIFLUCAN) 200 MG tablet Take 200 mg by mouth once a week. *Take on Monday mornings*      . fluticasone (FLONASE) 50 MCG/ACT nasal spray Place 1 spray into the nose as needed.      . gabapentin (NEURONTIN) 300 MG capsule Take 300 mg by mouth every morning.      Marland Kitchen HUMALOG KWIKPEN 100 UNIT/ML SOPN Inject 35-40 Units into the skin 3 (three) times daily with meals. As directed per sliding scale      . ibuprofen (ADVIL,MOTRIN) 200 MG tablet Take 800 mg by mouth 3 (three) times daily as needed for pain.       Marland Kitchen insulin glargine (LANTUS) 100 UNIT/ML injection Inject 30-50 Units into the skin 2 (two) times daily. 30 units injected in the morning and 50 units at bedtime      . lisinopril-hydrochlorothiazide (PRINZIDE,ZESTORETIC) 20-12.5 MG per tablet Take 1 tablet by mouth every morning.      . meclizine (ANTIVERT) 25 MG tablet Take 25 mg by mouth 2 (two) times daily.       . metFORMIN (GLUCOPHAGE) 1000 MG tablet Take 1,000 mg by mouth 2 (two) times daily with a meal.        . nitroGLYCERIN (NITROSTAT) 0.4 MG SL tablet Place 0.4 mg under the tongue every 5 (five) minutes as needed for chest pain.      Marland Kitchen omeprazole (PRILOSEC) 40 MG capsule Take 40 mg by mouth daily.       Marland Kitchen oxyCODONE (ROXICODONE) 15 MG immediate release tablet Take 15 mg by mouth 4 (four) times daily.      . promethazine (PHENERGAN) 25 MG tablet Take 25 mg by mouth every 6 (six) hours as needed for nausea.       . Rivaroxaban (XARELTO) 20 MG TABS Take 1 tablet (20 mg total) by mouth daily.  30 tablet  1  . [DISCONTINUED] diphenhydrAMINE (SOMINEX) 25 MG tablet Take 50-75 mg by mouth daily as needed for itching or allergies.        No facility-administered encounter medications  on file as of 09/13/2013.   Review of Systems  Constitutional: Positive for fatigue.  HENT: Positive for hearing loss.   Eyes: Positive for  visual disturbance (blurred vision).  Respiratory: Positive for cough, shortness of breath and wheezing.        Snoring  Cardiovascular: Positive for chest pain and leg swelling.  Endocrine: Positive for polydipsia.  Musculoskeletal: Positive for myalgias, joint swelling, arthralgias and gait problem.  Allergic/Immunologic: Positive for environmental allergies.  Neurological: Positive for dizziness and headaches.       Memory loss, restless leg  Psychiatric/Behavioral: Positive for sleep disturbance and dysphoric mood. The patient is nervous/anxious.     Objective:  Neurologic Exam  Physical Exam Physical Examination:   Filed Vitals:   09/13/13 1355  BP: 116/76  Pulse: 114  Temp: 98.8 F (37.1 C)   General Examination: The patient is a 42 y.o. male in no acute distress. He is morbidly obese.    HEENT: Normocephalic, atraumatic, pupils are equal, round and reactive to light and accommodation. Extraocular tracking is good without limitation to gaze excursion or nystagmus noted. Normal smooth pursuit is noted. Hearing is grossly intact. Face is symmetric with normal facial animation and normal facial sensation. Speech is clear with no dysarthria noted. There is no hypophonia. There is no lip, neck/head, jaw or voice tremor. Neck is supple with full range of passive and active motion. There are no carotid bruits on auscultation. Oropharynx exam reveals: severe mouth dryness, adequate dental hygiene and moderate airway crowding, due to large uvula, narrow airway, and larger tongue. Mallampati is class II. Tongue protrudes centrally and palate elevates symmetrically. Neck size is large.   Chest: Clear to auscultation without wheezing, rhonchi or crackles noted.  Heart: S1+S2+0, regular and normal without murmurs, rubs or gallops noted.   Abdomen:  Soft, non-tender and non-distended with normal bowel sounds appreciated on auscultation.  Extremities: There is trace pitting edema in the distal lower extremities bilaterally.   Skin: Warm and dry without trophic changes noted. There are no varicose veins.  Musculoskeletal: exam reveals no obvious joint deformities, tenderness or joint swelling or erythema.   Neurologically:  Mental status: The patient is awake, alert and oriented in all 4 spheres. His memory, attention, language and knowledge are fair. There is no aphasia, agnosia, apraxia or anomia. Speech is clear with normal prosody and enunciation. Thought process is linear. Mood is congruent and affect is constricted.  Cranial nerves are as described above under HEENT exam. In addition, shoulder shrug is normal with equal shoulder height noted. Motor exam: Normal bulk, strength and tone is noted. There is no drift, tremor or rebound. Romberg is negative. Reflexes are absent throughout. Fine motor skills are mildly impaired finger taps, hand movements, rapid alternating patting, foot taps and foot agility.  Cerebellar testing shows no dysmetria or intention tremor on finger to nose testing. There is no truncal or gait ataxia.  Sensory exam is significantly decreased to light touch, pinprick, vibration, temperature sense in the upper extremities up to the wrists and in the lower extremities up to the knees.  Gait, station and balance: he stands up with difficulty and needs to push himself up. No veering to one side is noted. No leaning to one side is noted. Posture is mildly stooped, He walks with a limp on the L, and c/o L knee pain. He turns in 3 steps. Tandem walk is not possible.   Assessment and Plan:   In summary, RYDEN WAINER is a pleasant 42 year old male with a complicated history including poorly controlled DM, morbid obesity, HTN, HLP with c/o dizziness, lightheadedness, vertigo, falls, gait disturbance, headaches.  His physical  exam is c/w severe neuropathy, likely diabetic, and his other Sx including his subjective memory issues and his intermittent HAs may be in part post-concussive. He was recently diagnosed with a PE and I explained to him the importance of vascular disease prevention. His gait disorder and balance issues are likely a function of multiple issues: Severe neuropathy, possible concussion, severe arthritis and obesity. He has a prior Dx of OSA and needs to be compliant with CPAP. I reviewed his CPAP titration results with him as well as his compliance data. I will go ahead and increase his CPAP pressure to 12 cm d/t residual AHI of over 20. He has had a higher leak as well. I again had a long chat with the patient about my findings and his problems: I stressed the importance of weight loss, the importance of tight glucose control, the importance of smoking cessation today. I would like for him to come back in 3 months. Most of his symptoms are multifactorial in nature and I essentially stressed the importance of trying to maintain a healthy lifestyle with him. He demonstrated understanding and voiced agreement.   Please note, that a new sleep study is needed due to insurance requirements because the patient has shown non-compliance. We have notified the patient and will order another split night study, to re-qualify the patient for treatment.

## 2013-10-11 ENCOUNTER — Encounter: Payer: Self-pay | Admitting: Neurology

## 2013-10-16 NOTE — Progress Notes (Signed)
Quick Note:  I reviewed the patient's CPAP compliance data from 07/08/2013 to 10/05/2013, which is a total of 90 days, during which time the patient used CPAP for only 75 days. The average usage for all days was 3 hours and 28 minutes. The percent used days greater than 4 hours was only 41%, indicating poor compliance. The residual AHI was high at 33.5 per hour, indicating an inadequate treatment pressure of 11 cwp with EPR of 3. His leak was at times quite high as well with a median of 12.1 95th percentile at 49.3 and a maximum of 118.3 L per minute. Please get in touch with the patient's DME company [employer] so they can help troubleshoot the leak and the lapses in his treatment adherence. I may have to increase his pressure too. I will review this data with the patient at the next office visit, provide feedback and additional troubleshooting if need be.  Huston Foley, MD, PhD [facility] ([facility]   ______

## 2013-10-24 ENCOUNTER — Telehealth: Payer: Self-pay | Admitting: *Deleted

## 2013-10-24 DIAGNOSIS — G4733 Obstructive sleep apnea (adult) (pediatric): Secondary | ICD-10-CM

## 2013-10-24 NOTE — Telephone Encounter (Signed)
Called and spoke to patient who stated he is frustrated because he understands from [facility] that he will have to start over in order to use CPAP therapy.  I explained to patient that this is Medicare's policy that must be followed during the first 90 days of therapy in order to get therapy paid for beyond that.  We discussed the details and he understood well.  I explained that worst case scenario, he has to have a new visit with Dr. Frances Furbish and a new study in order to have a fresh start and a new 90 days to show compliant usage of therapy.  I explained that Medicare does this so that patients have the care they need in order to be successful, rather than getting equipment they can't use that sits in their closet.  I also discussed with him the importance of addressing the issues he is having that are making compliance difficult so we can start him off right to help him be successful.  Primarily these issues are thus: 1.  Patient has been sick with a very productive cough since May.  He has been prescribed some inhalers and they are starting to help.  He will also visit with Dr. Delena Bali again on Friday.  He states, "and she's going to kick my butt and that's what I like about her", meaning she is going to let him know what he needs to be doing and then hold him accountable for doing it.  The cough has made it difficult because he coughs into the mask and the phlegm comes back at him and will choke him. 2.  Patient reports that he was using a mask and a chin strap but was still having a very high leak, he has since been given a new mask which is larger and this has helped significantly. 3.  He mentions that it is difficult for him to get to the doctor because of geographic location, he is 45 minutes away from his doctor, he also reports he has a deep distrust of physicians and this makes it difficult for him to follow up when problems occur.  I will get in touch with [facility] just to get more information on what is needed.   I explained that best case scenario, we can use his visit with Dr. Frances Furbish recently and schedule a new study in a way that he won't be without the machine if possible.

## 2013-10-24 NOTE — Telephone Encounter (Signed)
Message copied by Daryll Drown on Wed Oct 24, 2013  3:18 PM ------      Message from: Janey Greaser      Created: Tue Oct 23, 2013  4:19 PM      Regarding: Compliance       Patient called stated he received a letter from [facility] stating he wasn't in compliance and he stated he has been sick. Patient would like to speak with you about this issue if possible.                        Thanks            Jasmine December ------

## 2013-10-26 NOTE — Telephone Encounter (Signed)
This is Betsy from [facility] response to my question about how patient can keep his equipment:    Good job : ) I would make sure the below is noted in Dr Teofilo Pod office visit note that also refers him to go have another sleep study.   In the meantime he can keep the unit he has by signing an ABN Special educational needs teacher Notice). We have actually mailed him one of these but he has not returned. It basically states that he can hold onto his unit until he gets qualified for a new one, but if he does not qualify, he will have to private pay for the unit he is holding on to.   Let me know if you have any more questions. Thanks  [employer]

## 2013-10-26 NOTE — Telephone Encounter (Signed)
Dr. Frances Furbish, this patient due to non-compliance will be required to have another face to face visit with you, a new sleep study, new orders, etc. In order to use his current CPAP.  He has Medicare and Medicaid.  Please submit your note reviewing the information enclosed and place an order for a new Split Night study so that he may be able to retain his equipment.  Your last office visit will count as the face to face visit, it would help if you documented in that visit that "a new sleep study is needed due to insurance requirements because the patient has shown non-compliance."

## 2013-10-26 NOTE — Addendum Note (Signed)
Addended by: Huston Foley on: 10/26/2013 03:13 PM   Modules accepted: Orders

## 2014-01-07 ENCOUNTER — Ambulatory Visit: Payer: Medicare Other | Admitting: Neurology

## 2014-01-24 ENCOUNTER — Encounter (INDEPENDENT_AMBULATORY_CARE_PROVIDER_SITE_OTHER): Payer: Self-pay

## 2014-01-24 ENCOUNTER — Ambulatory Visit (INDEPENDENT_AMBULATORY_CARE_PROVIDER_SITE_OTHER): Payer: Medicare Other | Admitting: Neurology

## 2014-01-24 ENCOUNTER — Encounter: Payer: Self-pay | Admitting: Neurology

## 2014-01-24 VITALS — BP 118/90 | HR 112 | Temp 97.7°F | Ht 76.0 in | Wt 343.0 lb

## 2014-01-24 DIAGNOSIS — G4733 Obstructive sleep apnea (adult) (pediatric): Secondary | ICD-10-CM

## 2014-01-24 DIAGNOSIS — G609 Hereditary and idiopathic neuropathy, unspecified: Secondary | ICD-10-CM

## 2014-01-24 DIAGNOSIS — R42 Dizziness and giddiness: Secondary | ICD-10-CM

## 2014-01-24 DIAGNOSIS — IMO0001 Reserved for inherently not codable concepts without codable children: Secondary | ICD-10-CM

## 2014-01-24 DIAGNOSIS — E1165 Type 2 diabetes mellitus with hyperglycemia: Secondary | ICD-10-CM

## 2014-01-24 DIAGNOSIS — R51 Headache: Secondary | ICD-10-CM

## 2014-01-24 DIAGNOSIS — E669 Obesity, unspecified: Secondary | ICD-10-CM

## 2014-01-24 DIAGNOSIS — F17200 Nicotine dependence, unspecified, uncomplicated: Secondary | ICD-10-CM

## 2014-01-24 DIAGNOSIS — IMO0002 Reserved for concepts with insufficient information to code with codable children: Secondary | ICD-10-CM
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
## 2014-01-24 NOTE — Patient Instructions (Signed)
We will bring you back for another sleep study and hopefully, put you back on treatment with CPAP. It is imperative, that you stay on CPAP treatment and are fully compliant!  Please quit smoking!

## 2014-02-08 ENCOUNTER — Encounter (HOSPITAL_BASED_OUTPATIENT_CLINIC_OR_DEPARTMENT_OTHER): Payer: Medicare Other | Attending: General Surgery

## 2014-04-01 ENCOUNTER — Emergency Department (HOSPITAL_COMMUNITY): Payer: Medicare Other

## 2014-04-01 ENCOUNTER — Encounter (HOSPITAL_COMMUNITY): Payer: Self-pay | Admitting: Emergency Medicine

## 2014-04-01 ENCOUNTER — Emergency Department (HOSPITAL_COMMUNITY)
Admission: EM | Admit: 2014-04-01 | Discharge: 2014-04-01 | Disposition: A | Discharge status: Home / Self Care | Payer: Medicare Other | Attending: Emergency Medicine | Admitting: Emergency Medicine

## 2014-04-01 DIAGNOSIS — R000 Tachycardia, unspecified: Secondary | ICD-10-CM | POA: Insufficient documentation

## 2014-04-01 DIAGNOSIS — R042 Hemoptysis: Secondary | ICD-10-CM | POA: Insufficient documentation

## 2014-04-01 DIAGNOSIS — I2510 Atherosclerotic heart disease of native coronary artery without angina pectoris: Secondary | ICD-10-CM | POA: Insufficient documentation

## 2014-04-01 DIAGNOSIS — Z79899 Other long term (current) drug therapy: Secondary | ICD-10-CM | POA: Insufficient documentation

## 2014-04-01 DIAGNOSIS — E785 Hyperlipidemia, unspecified: Secondary | ICD-10-CM | POA: Insufficient documentation

## 2014-04-01 DIAGNOSIS — Z7982 Long term (current) use of aspirin: Secondary | ICD-10-CM | POA: Insufficient documentation

## 2014-04-01 DIAGNOSIS — Z86711 Personal history of pulmonary embolism: Secondary | ICD-10-CM | POA: Insufficient documentation

## 2014-04-01 DIAGNOSIS — Z7901 Long term (current) use of anticoagulants: Secondary | ICD-10-CM | POA: Insufficient documentation

## 2014-04-01 DIAGNOSIS — F17200 Nicotine dependence, unspecified, uncomplicated: Secondary | ICD-10-CM | POA: Insufficient documentation

## 2014-04-01 DIAGNOSIS — Z794 Long term (current) use of insulin: Secondary | ICD-10-CM | POA: Insufficient documentation

## 2014-04-01 DIAGNOSIS — R9389 Abnormal findings on diagnostic imaging of other specified body structures: Secondary | ICD-10-CM

## 2014-04-01 DIAGNOSIS — I252 Old myocardial infarction: Secondary | ICD-10-CM | POA: Insufficient documentation

## 2014-04-01 DIAGNOSIS — E119 Type 2 diabetes mellitus without complications: Secondary | ICD-10-CM | POA: Insufficient documentation

## 2014-04-01 DIAGNOSIS — I10 Essential (primary) hypertension: Secondary | ICD-10-CM | POA: Insufficient documentation

## 2014-04-01 DIAGNOSIS — J159 Unspecified bacterial pneumonia: Secondary | ICD-10-CM | POA: Insufficient documentation

## 2014-04-01 DIAGNOSIS — J189 Pneumonia, unspecified organism: Secondary | ICD-10-CM

## 2014-04-01 DIAGNOSIS — Z791 Long term (current) use of non-steroidal anti-inflammatories (NSAID): Secondary | ICD-10-CM | POA: Insufficient documentation

## 2014-04-01 LAB — BASIC METABOLIC PANEL
BUN: 19 mg/dL (ref 6–23)
CO2: 28 mEq/L (ref 19–32)
Calcium: 10.1 mg/dL (ref 8.4–10.5)
Chloride: 94 mEq/L — ABNORMAL LOW (ref 96–112)
Creatinine, Ser: 1.16 mg/dL (ref 0.50–1.35)
GFR calc Af Amer: 90 mL/min — ABNORMAL LOW (ref >90)
GFR calc non Af Amer: 77 mL/min — ABNORMAL LOW (ref >90)
Glucose, Bld: 538 mg/dL — ABNORMAL HIGH (ref 70–99)
Potassium: 5.2 mEq/L (ref 3.7–5.3)
Sodium: 136 mEq/L — ABNORMAL LOW (ref 137–147)

## 2014-04-01 LAB — CBC WITH DIFFERENTIAL/PLATELET
Basophils Absolute: 0 10*3/uL (ref 0.0–0.1)
Basophils Relative: 0 % (ref 0–1)
Eosinophils Absolute: 0.3 10*3/uL (ref 0.0–0.7)
Eosinophils Relative: 3 % (ref 0–5)
HCT: 42.8 % (ref 39.0–52.0)
Hemoglobin: 15 g/dL (ref 13.0–17.0)
Lymphocytes Relative: 29 % (ref 12–46)
Lymphs Abs: 3.1 10*3/uL (ref 0.7–4.0)
MCH: 29.5 pg (ref 26.0–34.0)
MCHC: 35 g/dL (ref 30.0–36.0)
MCV: 84.3 fL (ref 78.0–100.0)
Monocytes Absolute: 0.7 10*3/uL (ref 0.1–1.0)
Monocytes Relative: 6 % (ref 3–12)
Neutro Abs: 6.6 10*3/uL (ref 1.7–7.7)
Neutrophils Relative %: 62 % (ref 43–77)
Platelets: 297 10*3/uL (ref 150–400)
RBC: 5.08 MIL/uL (ref 4.22–5.81)
RDW: 12.3 % (ref 11.5–15.5)
WBC: 10.7 10*3/uL — ABNORMAL HIGH (ref 4.0–10.5)

## 2014-04-01 LAB — PROTIME-INR
INR: 1.07 (ref 0.00–1.49)
Prothrombin Time: 13.7 seconds (ref 11.6–15.2)

## 2014-04-01 LAB — RAPID STREP SCREEN (MED CTR MEBANE ONLY): Streptococcus, Group A Screen (Direct): NEGATIVE

## 2014-04-01 LAB — D-DIMER, QUANTITATIVE: D-Dimer, Quant: <0.27 ug/mL-FEU (ref 0.00–0.48)

## 2014-04-01 MED ORDER — BENZONATATE 100 MG PO CAPS: 100.0000 mg | ORAL_CAPSULE | Freq: Three times a day (TID) | ORAL | Status: DC

## 2014-04-01 MED ORDER — LEVOFLOXACIN 500 MG PO TABS
500.0000 mg | ORAL_TABLET | Freq: Once | ORAL | Status: AC
  Administered 2014-04-01: 500 mg via ORAL
  Filled 2014-04-01: qty 1

## 2014-04-01 MED ORDER — IOHEXOL 350 MG/ML SOLN
100.0000 mL | Freq: Once | INTRAVENOUS | Status: AC | PRN
  Administered 2014-04-01: 100 mL via INTRAVENOUS

## 2014-04-01 MED ORDER — LEVOFLOXACIN 500 MG PO TABS: 500.0000 mg | ORAL_TABLET | Freq: Every day | ORAL | Status: DC

## 2014-04-01 NOTE — ED Notes (Signed)
Walked into patients room and patient had IV in his hand. Stated "I pulled it out. I do it all the time." No bleeding noted at site. Catheter intact.

## 2014-04-01 NOTE — Discharge Instructions (Signed)
The abnormality in your CAT scan could be an infection (Pneumonia), or a malignancy (lung cancer). After your in a boxer completed, he needs to have another CAT scan with your primary care physician.  Pneumonia, Adult Pneumonia is an infection of the lungs.  CAUSES Pneumonia may be caused by bacteria or a virus. Usually, these infections are caused by breathing infectious particles into the lungs (respiratory tract). SYMPTOMS   Cough.  Fever.  Chest pain.  Increased rate of breathing.  Wheezing.  Mucus production. DIAGNOSIS  If you have the common symptoms of pneumonia, your caregiver will typically confirm the diagnosis with a chest X-ray. The X-ray will show an abnormality in the lung (pulmonary infiltrate) if you have pneumonia. Other tests of your blood, urine, or sputum may be done to find the specific cause of your pneumonia. Your caregiver may also do tests (blood gases or pulse oximetry) to see how well your lungs are working. TREATMENT  Some forms of pneumonia may be spread to other people when you cough or sneeze. You may be asked to wear a mask before and during your exam. Pneumonia that is caused by bacteria is treated with antibiotic medicine. Pneumonia that is caused by the influenza virus may be treated with an antiviral medicine. Most other viral infections must run their course. These infections will not respond to antibiotics.  PREVENTION A pneumococcal shot (vaccine) is available to prevent a common bacterial cause of pneumonia. This is usually suggested for:  People over 41 years old.  Patients on chemotherapy.  People with chronic lung problems, such as bronchitis or emphysema.  People with immune system problems. If you are over 65 or have a high risk condition, you may receive the pneumococcal vaccine if you have not received it before. In some countries, a routine influenza vaccine is also recommended. This vaccine can help prevent some cases of  pneumonia.You may be offered the influenza vaccine as part of your care. If you smoke, it is time to quit. You may receive instructions on how to stop smoking. Your caregiver can provide medicines and counseling to help you quit. HOME CARE INSTRUCTIONS   Cough suppressants may be used if you are losing too much rest. However, coughing protects you by clearing your lungs. You should avoid using cough suppressants if you can.  Your caregiver may have prescribed medicine if he or she thinks your pneumonia is caused by a bacteria or influenza. Finish your medicine even if you start to feel better.  Your caregiver may also prescribe an expectorant. This loosens the mucus to be coughed up.  Only take over-the-counter or prescription medicines for pain, discomfort, or fever as directed by your caregiver.  Do not smoke. Smoking is a common cause of bronchitis and can contribute to pneumonia. If you are a smoker and continue to smoke, your cough may last several weeks after your pneumonia has cleared.  A cold steam vaporizer or humidifier in your room or home may help loosen mucus.  Coughing is often worse at night. Sleeping in a semi-upright position in a recliner or using a couple pillows under your head will help with this.  Get rest as you feel it is needed. Your body will usually let you know when you need to rest. SEEK IMMEDIATE MEDICAL CARE IF:   Your illness becomes worse. This is especially true if you are elderly or weakened from any other disease.  You cannot control your cough with suppressants and are losing sleep.  You begin coughing up blood.  You develop pain which is getting worse or is uncontrolled with medicines.  You have a fever.  Any of the symptoms which initially brought you in for treatment are getting worse rather than better.  You develop shortness of breath or chest pain. MAKE SURE YOU:   Understand these instructions.  Will watch your condition.  Will get  help right away if you are not doing well or get worse. Document Released: 12/06/2005 Document Revised: 02/28/2012 Document Reviewed: 02/25/2011 [facility] Patient Information 2014 [place]Care, [place].

## 2014-04-01 NOTE — ED Notes (Signed)
Patient asleep.

## 2014-04-01 NOTE — ED Notes (Signed)
Chest pain and sob times one month.  Went to family doctor today and was sent to the er to be worked for PE.

## 2014-04-01 NOTE — ED Provider Notes (Signed)
CSN: [id number]     Arrival date & time 04/01/14  1755 History  This chart was scribed for Joseph Porter, MD by Bennett Scrape, ED Scribe. This patient was seen in room APA02/APA02 and the patient's care was started at 8:24 PM.   Chief Complaint  Patient presents with  . Shortness of Breath  . Chest Pain      The history is provided by the patient and the spouse. No language interpreter was used.    HPI Comments: Joseph [name] is a 43 y.o. male who presents to the Emergency Department complaining of constant mid sternal CP over the past month with associated cough that is worse at night for the past 2 to 3 months. Pt reports that last night the cough was productive of blood but has mostly been clear mucus. He also reports hoarse voice for the past week and left-sided facial pain x2 years due to sinus cyst without changes. He is following up with a surgeon for a cyst removal. He was diagnosed with a PE last August. He was admitted and was started on 20 mg Zarelto. He denies any missed doses in the past 2 weeks. He denies any new diaphoresis, calf swelling or leg swelling. He denies any prior TB diagnoses, TB exposure or any chronic renal diseases. He is a current everyday smoker and occasional alcohol user.   Past Medical History  Diagnosis Date  . CAD (coronary artery disease)   . Hypertension   . Hyperlipidemia   . Diabetes mellitus   Prior multiple PEs MI in 1995, admitted for 4 to5 days, angiogram done, prior cocaine use but had stopped, chronic deficit of brief intermittent CP  Past Surgical History  Procedure Laterality Date  . Knee arthoplasty, 6 years ago    . Carpel tunnel     History reviewed. No pertinent family history. History  Substance Use Topics  . Smoking status: Current Every Day Smoker -- 1.50 packs/day    Types: Cigarettes  . Smokeless tobacco: Not on file  . Alcohol Use: Yes     Comment: occasional    Review of Systems  Constitutional: Negative for fever,  chills, diaphoresis, appetite change and fatigue.  HENT: Positive for voice change. Negative for mouth sores, sore throat and trouble swallowing.   Eyes: Negative for visual disturbance.  Respiratory: Positive for cough. Negative for chest tightness and wheezing.   Cardiovascular: Positive for chest pain.  Gastrointestinal: Negative for nausea, vomiting, abdominal pain, diarrhea and abdominal distention.  Endocrine: Negative for polydipsia, polyphagia and polyuria.  Genitourinary: Negative for dysuria, frequency and hematuria.  Musculoskeletal: Negative for gait problem.  Skin: Negative for color change, pallor and rash.  Neurological: Negative for dizziness, syncope, light-headedness and headaches.  Hematological: Does not bruise/bleed easily.  Psychiatric/Behavioral: Negative for behavioral problems and confusion.    Allergies  Sulfa antibiotics  Home Medications   Current Outpatient Rx  Name  Route  Sig  Dispense  Refill  . albuterol (PROAIR HFA) 108 (90 BASE) MCG/ACT inhaler   Inhalation   Inhale 2 puffs into the lungs every 6 (six) hours as needed.           . ALPRAZolam (XANAX) 1 MG tablet   Oral   Take 1 mg by mouth 3 (three) times daily.          Marland Kitchen amLODipine (NORVASC) 2.5 MG tablet   Oral   Take 2.5 mg by mouth every morning.          Marland Kitchen  aspirin 81 MG EC tablet   Oral   Take 1 tablet (81 mg total) by mouth daily. Swallow whole.   30 tablet   12   . buPROPion (WELLBUTRIN XL) 150 MG 24 hr tablet   Oral   Take 150 mg by mouth 2 (two) times daily.           . carisoprodol (SOMA) 350 MG tablet   Oral   Take 350 mg by mouth 3 (three) times daily as needed for muscle spasms (for  back spasms).         . diclofenac (VOLTAREN) 75 MG EC tablet   Oral   Take 75 mg by mouth 2 (two) times daily.         . diphenhydrAMINE (BENADRYL) 25 mg capsule   Oral   Take 25 mg by mouth every 6 (six) hours as needed for itching or allergies.         . fenofibrate  (TRICOR) 145 MG tablet   Oral   Take 145 mg by mouth every morning.          . fluconazole (DIFLUCAN) 200 MG tablet   Oral   Take 200 mg by mouth once a week. *Take on Monday mornings*         . fluticasone (FLONASE) 50 MCG/ACT nasal spray   Nasal   Place 1 spray into the nose daily as needed for allergies or rhinitis.          Marland Kitchen gabapentin (NEURONTIN) 300 MG capsule   Oral   Take 300 mg by mouth every morning.         Marland Kitchen HUMALOG KWIKPEN 100 UNIT/ML SOPN   Subcutaneous   Inject 35-40 Units into the skin 3 (three) times daily with meals. As directed per sliding scale         . ibuprofen (ADVIL,MOTRIN) 200 MG tablet   Oral   Take 800 mg by mouth 3 (three) times daily as needed for pain.          Marland Kitchen insulin glargine (LANTUS) 100 UNIT/ML injection   Subcutaneous   Inject 60 Units into the skin 2 (two) times daily.          Marland Kitchen lisinopril-hydrochlorothiazide (PRINZIDE,ZESTORETIC) 20-12.5 MG per tablet   Oral   Take 1 tablet by mouth every morning.         . meclizine (ANTIVERT) 25 MG tablet   Oral   Take 25 mg by mouth 2 (two) times daily.          . metFORMIN (GLUCOPHAGE) 1000 MG tablet   Oral   Take 1,000 mg by mouth 2 (two) times daily with a meal.           . nitroGLYCERIN (NITROSTAT) 0.4 MG SL tablet   Sublingual   Place 0.4 mg under the tongue every 5 (five) minutes as needed for chest pain.         Marland Kitchen omeprazole (PRILOSEC) 40 MG capsule   Oral   Take 40 mg by mouth daily.          Marland Kitchen oxyCODONE (ROXICODONE) 15 MG immediate release tablet   Oral   Take 15 mg by mouth 5 (five) times daily.          . promethazine (PHENERGAN) 25 MG tablet   Oral   Take 25 mg by mouth every 6 (six) hours as needed for nausea.          . Rivaroxaban (XARELTO) 20  MG TABS   Oral   Take 1 tablet (20 mg total) by mouth daily.   30 tablet   1   . benzonatate (TESSALON) 100 MG capsule   Oral   Take 1 capsule (100 mg total) by mouth every 8 (eight) hours.    21 capsule   0   . levofloxacin (LEVAQUIN) 500 MG tablet   Oral   Take 1 tablet (500 mg total) by mouth daily.   14 tablet   0    Triage Vitals: BP 118/82  Pulse 113  Temp(Src) 98.5 F (36.9 C) (Oral)  Resp 20  Ht 6\' 5"  (1.956 m)  Wt 332 lb (150.594 kg)  BMI 39.36 kg/m2  SpO2 100%  Physical Exam  Nursing note and vitals reviewed. Constitutional: He is oriented to person, place, and time. He appears well-developed and well-nourished. No distress.  HENT:  Head: Normocephalic and atraumatic.  2+ tonsils, white exudate on left tonsil  Eyes: Conjunctivae are normal. Pupils are equal, round, and reactive to light. No scleral icterus.  Neck: Normal range of motion. Neck supple. No thyromegaly present.  Cardiovascular: Regular rhythm.  Tachycardia present.  Exam reveals no gallop and no friction rub.   No murmur heard. Pulmonary/Chest: Effort normal and breath sounds normal. No respiratory distress. He has no wheezes. He has no rales.  Abdominal: Soft. Bowel sounds are normal. He exhibits no distension. There is no tenderness. There is no rebound.  Musculoskeletal: Normal range of motion.  Left calf is 2 cm larger in circumference than the right calf  Neurological: He is alert and oriented to person, place, and time.  Skin: Skin is warm and dry. No rash noted.  Psychiatric: He has a normal mood and affect. His behavior is normal.    ED Course  Procedures (including critical care time)  DIAGNOSTIC STUDIES: Oxygen Saturation is 100% on RA, normal by my interpretation.    COORDINATION OF CARE: 8:32 PM-Discussed treatment plan which includes strep test and CT angio with pt at bedside and pt agreed to plan.   Labs Review Labs Reviewed  CBC WITH DIFFERENTIAL - Abnormal; Notable for the following:    WBC 10.7 (*)    All other components within normal limits  BASIC METABOLIC PANEL - Abnormal; Notable for the following:    Sodium 136 (*)    Chloride 94 (*)    Glucose, Bld 538  (*)    GFR calc non Af Amer 77 (*)    GFR calc Af Amer 90 (*)    All other components within normal limits  RAPID STREP SCREEN  CULTURE, GROUP A STREP  D-DIMER, QUANTITATIVE  PROTIME-INR   Imaging Review Ct Angio Chest Pe W/cm &/or Wo Cm  04/01/2014   CLINICAL DATA:  Chest pain, tachycardia and hemoptysis.  EXAM: CT ANGIOGRAPHY CHEST WITH CONTRAST  TECHNIQUE: Multidetector CT imaging of the chest was performed using the standard protocol during bolus administration of intravenous contrast. Multiplanar CT image reconstructions and MIPs were obtained to evaluate the vascular anatomy.  CONTRAST:  100mL OMNIPAQUE IOHEXOL 350 MG/ML SOLN  COMPARISON:  DG CHEST 2 VIEW dated 08/15/2013; CT ANGIO CHEST W/CM &/OR WO/CM dated 04/26/2013  FINDINGS: The pulmonary arteries are adequately opacified. There is no evidence of pulmonary embolism. Central pulmonary arteries are of normal caliber. The thoracic aorta is of normal caliber. The coronary arteries are blurred. However, calcified plaque is suspected in the distribution of the LAD. The heart size is normal. There is some eccentric  calcification associated with the aortic valve.  Lungs show focal ground-glass opacity in the peripheral right upper lobe measuring approximately 1.6 x 2.1 cm. Some other smaller subtle areas of ground-glass density are present more inferiorly and deeper in the right upper lobe. These areas may be representative of infection. Followup is recommended given the more nodular peripheral ground-glass opacity. There is suggestion of mild interstitial prominence throughout both lungs without overt edema. No pneumothorax is identified. There is no evidence of pleural or pericardial fluid. No lymphadenopathy is seen.  Visualized upper abdomen shows evidence of hepatomegaly and diffuse hepatic steatosis. Bony structures are unremarkable.  Review of the MIP images confirms the above findings.  IMPRESSION: 1. No evidence of pulmonary embolism. 2. Areas  of ground-glass opacity in the right upper lobe which may represent infection. The larger area measures approximately 1.6 x 2.1 cm and should be followed as low-grade neoplasm cannot be excluded based on CT appearance. 3. Calcified coronary plaque is suspected in the distribution of the LAD.   Electronically Signed   By: Irish LackGlenn  Yamagata M.D.   On: 04/01/2014 21:59     EKG Interpretation None      MDM   Final diagnoses:  Community acquired pneumonia  Abnormal CT scan, chest    Patient presents with hemoptysis. He is anticoagulated. CT scan does not show a pulmonary embolus. CT shows an abnormality that is read as infection versus malignancy per radiology. Plan will be 14 days of Levaquin. I told the patient in the presence of his wife at in no uncertain terms he one or repeat CAT scan after completion of his antibiotics to further evaluate the abnormality.  Patient presents his understanding of this. This is given to him in written form.  Told in no uncertain terms to stop smoking.  I personally performed the services described in this documentation, which was scribed in my presence. The recorded information has been reviewed and is accurate.      Joseph PorterMark Karee Forge, MD 04/01/14 [phone]

## 2014-04-03 LAB — CULTURE, GROUP A STREP

## 2014-05-21 ENCOUNTER — Ambulatory Visit: Payer: Medicare Other | Admitting: Neurology

## 2014-09-08 ENCOUNTER — Emergency Department (INDEPENDENT_AMBULATORY_CARE_PROVIDER_SITE_OTHER)
Admission: EM | Admit: 2014-09-08 | Discharge: 2014-09-08 | Disposition: A | Discharge status: Home / Self Care | Payer: Medicare Other | Source: Home / Self Care | Attending: Family Medicine | Admitting: Family Medicine

## 2014-09-08 ENCOUNTER — Encounter (HOSPITAL_COMMUNITY): Payer: Self-pay | Admitting: Emergency Medicine

## 2014-09-08 DIAGNOSIS — E109 Type 1 diabetes mellitus without complications: Secondary | ICD-10-CM

## 2014-09-08 NOTE — ED Notes (Signed)
Patient states takes diabetic medications In need of refill on his test strips

## 2014-09-08 NOTE — ED Provider Notes (Signed)
Joseph [name] is a 43 y.o. male who presents to Urgent Care today for diabetes when sats. Patient has diabetes and currently takes insulin. He is between doctors and has run out of lancets and test strips. He would like a refill of possible. He feels well otherwise.   Past Medical History  Diagnosis Date  . CAD (coronary artery disease)   . Hypertension   . Hyperlipidemia   . Diabetes mellitus    History  Substance Use Topics  . Smoking status: Current Every Day Smoker -- 1.50 packs/day    Types: Cigarettes  . Smokeless tobacco: Not on file  . Alcohol Use: Yes     Comment: occasional   ROS as above Medications: No current facility-administered medications for this encounter.   Current Outpatient Prescriptions  Medication Sig Dispense Refill  . albuterol (PROAIR HFA) 108 (90 BASE) MCG/ACT inhaler Inhale 2 puffs into the lungs every 6 (six) hours as needed.        . ALPRAZolam (XANAX) 1 MG tablet Take 1 mg by mouth 3 (three) times daily.       Marland Kitchen amLODipine (NORVASC) 2.5 MG tablet Take 2.5 mg by mouth every morning.       Marland Kitchen aspirin 81 MG EC tablet Take 1 tablet (81 mg total) by mouth daily. Swallow whole.  30 tablet  12  . benzonatate (TESSALON) 100 MG capsule Take 1 capsule (100 mg total) by mouth every 8 (eight) hours.  21 capsule  0  . buPROPion (WELLBUTRIN XL) 150 MG 24 hr tablet Take 150 mg by mouth 2 (two) times daily.        . carisoprodol (SOMA) 350 MG tablet Take 350 mg by mouth 3 (three) times daily as needed for muscle spasms (for  back spasms).      . diclofenac (VOLTAREN) 75 MG EC tablet Take 75 mg by mouth 2 (two) times daily.      . diphenhydrAMINE (BENADRYL) 25 mg capsule Take 25 mg by mouth every 6 (six) hours as needed for itching or allergies.      . fenofibrate (TRICOR) 145 MG tablet Take 145 mg by mouth every morning.       . fluconazole (DIFLUCAN) 200 MG tablet Take 200 mg by mouth once a week. *Take on Monday mornings*      . fluticasone (FLONASE) 50 MCG/ACT  nasal spray Place 1 spray into the nose daily as needed for allergies or rhinitis.       Marland Kitchen gabapentin (NEURONTIN) 300 MG capsule Take 300 mg by mouth every morning.      Marland Kitchen HUMALOG KWIKPEN 100 UNIT/ML SOPN Inject 35-40 Units into the skin 3 (three) times daily with meals. As directed per sliding scale      . ibuprofen (ADVIL,MOTRIN) 200 MG tablet Take 800 mg by mouth 3 (three) times daily as needed for pain.       Marland Kitchen insulin glargine (LANTUS) 100 UNIT/ML injection Inject 60 Units into the skin 2 (two) times daily.       Marland Kitchen levofloxacin (LEVAQUIN) 500 MG tablet Take 1 tablet (500 mg total) by mouth daily.  14 tablet  0  . lisinopril-hydrochlorothiazide (PRINZIDE,ZESTORETIC) 20-12.5 MG per tablet Take 1 tablet by mouth every morning.      . meclizine (ANTIVERT) 25 MG tablet Take 25 mg by mouth 2 (two) times daily.       . metFORMIN (GLUCOPHAGE) 1000 MG tablet Take 1,000 mg by mouth 2 (two) times daily with a meal.        .  nitroGLYCERIN (NITROSTAT) 0.4 MG SL tablet Place 0.4 mg under the tongue every 5 (five) minutes as needed for chest pain.      Marland Kitchen omeprazole (PRILOSEC) 40 MG capsule Take 40 mg by mouth daily.       Marland Kitchen oxyCODONE (ROXICODONE) 15 MG immediate release tablet Take 15 mg by mouth 5 (five) times daily.       . promethazine (PHENERGAN) 25 MG tablet Take 25 mg by mouth every 6 (six) hours as needed for nausea.       . Rivaroxaban (XARELTO) 20 MG TABS Take 1 tablet (20 mg total) by mouth daily.  30 tablet  1    Exam:  BP 138/87  Pulse 88  Temp(Src) 100.1 F (37.8 C) (Oral)  Resp 18  SpO2 98% Gen: Well NAD morbidly obese HEENT: EOMI,  MMM Lungs: Normal work of breathing. CTABL Heart: RRR no MRG Abd: NABS, Soft. Nondistended, Nontender Exts: Brisk capillary refill, warm and well perfused.   No results found for this or any previous visit (from the past 24 hour(s)). No results found.  Assessment and Plan: 43 y.o. male with refill lancets and test strips. Followup with new  PCP.  Discussed warning signs or symptoms. Please see discharge instructions. Patient expresses understanding.     Rodolph Bong, MD 09/08/14 [phone]

## 2014-09-08 NOTE — Discharge Instructions (Signed)
Thank you for coming in today. Followup with your Dr. soon as possible   Diabetes and Standards of Medical Care Diabetes is complicated. You may find that your diabetes team includes a dietitian, nurse, diabetes educator, eye doctor, and more. To help everyone know what is going on and to help you get the care you deserve, the following schedule of care was developed to help keep you on track. Below are the tests, exams, vaccines, medicines, education, and plans you will need. HbA1c test This test shows how well you have controlled your glucose over the past 2-3 months. It is used to see if your diabetes management plan needs to be adjusted.   It is performed at least 2 times a year if you are meeting treatment goals.  It is performed 4 times a year if therapy has changed or if you are not meeting treatment goals. Blood pressure test  This test is performed at every routine medical visit. The goal is less than 140/90 mm Hg for most people, but 130/80 mm Hg in some cases. Ask your health care provider about your goal. Dental exam  Follow up with the dentist regularly. Eye exam  If you are diagnosed with type 1 diabetes as a child, get an exam upon reaching the age of 59 years or older and have had diabetes for 3-5 years. Yearly eye exams are recommended after that initial eye exam.  If you are diagnosed with type 1 diabetes as an adult, get an exam within 5 years of diagnosis and then yearly.  If you are diagnosed with type 2 diabetes, get an exam as soon as possible after the diagnosis and then yearly. Foot care exam  Visual foot exams are performed at every routine medical visit. The exams check for cuts, injuries, or other problems with the feet.  A comprehensive foot exam should be done yearly. This includes visual inspection as well as assessing foot pulses and testing for loss of sensation.  Check your feet nightly for cuts, injuries, or other problems with your feet. Tell your  health care provider if anything is not healing. Kidney function test (urine microalbumin)  This test is performed once a year.  Type 1 diabetes: The first test is performed 5 years after diagnosis.  Type 2 diabetes: The first test is performed at the time of diagnosis.  A serum creatinine and estimated glomerular filtration rate (eGFR) test is done once a year to assess the level of chronic kidney disease (CKD), if present. Lipid profile (cholesterol, HDL, LDL, triglycerides)  Performed every 5 years for most people.  The goal for LDL is less than 100 mg/dL. If you are at high risk, the goal is less than 70 mg/dL.  The goal for HDL is 40 mg/dL-50 mg/dL for men and 50 mg/dL-60 mg/dL for women. An HDL cholesterol of 60 mg/dL or higher gives some protection against heart disease.  The goal for triglycerides is less than 150 mg/dL. Influenza vaccine, pneumococcal vaccine, and hepatitis B vaccine  The influenza vaccine is recommended yearly.  It is recommended that people with diabetes who are over 62 years old get the pneumonia vaccine. In some cases, two separate shots may be given. Ask your health care provider if your pneumonia vaccination is up to date.  The hepatitis B vaccine is also recommended for adults with diabetes. Diabetes self-management education  Education is recommended at diagnosis and ongoing as needed. Treatment plan  Your treatment plan is reviewed at every medical  visit. Document Released: 10/03/2009 Document Revised: 04/22/2014 Document Reviewed: 05/08/2013 [employer] Patient Information 2015 [place], [place]. This information is not intended to replace advice given to you by your health care provider. Make sure you discuss any questions you have with your health care provider.

## 2015-01-06 LAB — LIPID PANEL: LDL Cholesterol: 170 mg/dL

## 2015-01-09 ENCOUNTER — Encounter (HOSPITAL_COMMUNITY): Payer: Self-pay

## 2015-01-09 ENCOUNTER — Emergency Department (HOSPITAL_COMMUNITY)
Admission: EM | Admit: 2015-01-09 | Discharge: 2015-01-09 | Disposition: A | Discharge status: Home / Self Care | Payer: Medicare Other | Attending: Emergency Medicine | Admitting: Emergency Medicine

## 2015-01-09 ENCOUNTER — Emergency Department (HOSPITAL_COMMUNITY): Payer: Medicare Other

## 2015-01-09 DIAGNOSIS — Y9289 Other specified places as the place of occurrence of the external cause: Secondary | ICD-10-CM | POA: Insufficient documentation

## 2015-01-09 DIAGNOSIS — E119 Type 2 diabetes mellitus without complications: Secondary | ICD-10-CM | POA: Diagnosis not present

## 2015-01-09 DIAGNOSIS — I2510 Atherosclerotic heart disease of native coronary artery without angina pectoris: Secondary | ICD-10-CM | POA: Insufficient documentation

## 2015-01-09 DIAGNOSIS — Z791 Long term (current) use of non-steroidal anti-inflammatories (NSAID): Secondary | ICD-10-CM | POA: Diagnosis not present

## 2015-01-09 DIAGNOSIS — Z79899 Other long term (current) drug therapy: Secondary | ICD-10-CM | POA: Diagnosis not present

## 2015-01-09 DIAGNOSIS — Y998 Other external cause status: Secondary | ICD-10-CM | POA: Diagnosis not present

## 2015-01-09 DIAGNOSIS — Z7901 Long term (current) use of anticoagulants: Secondary | ICD-10-CM | POA: Insufficient documentation

## 2015-01-09 DIAGNOSIS — S6010XA Contusion of unspecified finger with damage to nail, initial encounter: Secondary | ICD-10-CM | POA: Diagnosis not present

## 2015-01-09 DIAGNOSIS — Y9389 Activity, other specified: Secondary | ICD-10-CM | POA: Insufficient documentation

## 2015-01-09 DIAGNOSIS — Z7982 Long term (current) use of aspirin: Secondary | ICD-10-CM | POA: Diagnosis not present

## 2015-01-09 DIAGNOSIS — S60221A Contusion of right hand, initial encounter: Secondary | ICD-10-CM | POA: Insufficient documentation

## 2015-01-09 DIAGNOSIS — W230XXA Caught, crushed, jammed, or pinched between moving objects, initial encounter: Secondary | ICD-10-CM | POA: Diagnosis not present

## 2015-01-09 DIAGNOSIS — I10 Essential (primary) hypertension: Secondary | ICD-10-CM | POA: Insufficient documentation

## 2015-01-09 DIAGNOSIS — S6991XA Unspecified injury of right wrist, hand and finger(s), initial encounter: Secondary | ICD-10-CM | POA: Diagnosis present

## 2015-01-09 DIAGNOSIS — Z720 Tobacco use: Secondary | ICD-10-CM | POA: Diagnosis not present

## 2015-01-09 DIAGNOSIS — S6000XA Contusion of unspecified finger without damage to nail, initial encounter: Secondary | ICD-10-CM

## 2015-01-09 DIAGNOSIS — R52 Pain, unspecified: Secondary | ICD-10-CM

## 2015-01-09 MED ORDER — OXYCODONE-ACETAMINOPHEN 5-325 MG PO TABS: 1.0000 | ORAL_TABLET | Freq: Four times a day (QID) | ORAL | Status: DC | PRN

## 2015-01-09 NOTE — ED Notes (Signed)
Pt reports accidentally shut his r hand in a car door.  Says hurts to move r hand.

## 2015-01-09 NOTE — ED Notes (Signed)
Pain rt hand after hit by car door, Ice pack applied, Pain thenar region and across the mp joints.

## 2015-01-09 NOTE — ED Provider Notes (Signed)
CSN: [id number]     Arrival date & time 01/09/15  1637 History   First MD Initiated Contact with Patient 01/09/15 1647     Chief Complaint  Patient presents with  . Hand Pain     (Consider location/radiation/quality/duration/timing/severity/associated sxs/prior Treatment) Patient is a 44 y.o. male presenting with hand pain. The history is provided by the patient.  Hand Pain This is a new problem. The current episode started today. The problem has been unchanged. Exacerbated by: movement of hand.   Joseph Albrighterry W Savary is a 44 y.o. male who presents to the ED with right hand pain. He accidentally closed his right hand in the car door. Complains of swelling to the hand. Denies any other injuries.   Past Medical History  Diagnosis Date  . CAD (coronary artery disease)   . Hypertension   . Hyperlipidemia   . Diabetes mellitus    Past Surgical History  Procedure Laterality Date  . Knee arthoplasty    . Carpel tunnel    . Shoulder surgery     No family history on file. History  Substance Use Topics  . Smoking status: Current Every Day Smoker -- 1.50 packs/day    Types: Cigarettes  . Smokeless tobacco: Not on file  . Alcohol Use: Yes     Comment: occasional    Review of Systems Negative except as stated in HPI   Allergies  Sulfa antibiotics  Home Medications   Prior to Admission medications   Medication Sig Start Date End Date Taking? Authorizing Provider  ALPRAZolam Prudy Feeler(XANAX) 1 MG tablet Take 1 mg by mouth 3 (three) times daily.    Yes Historical Provider, MD  amLODipine (NORVASC) 2.5 MG tablet Take 2.5 mg by mouth every morning.    Yes Historical Provider, MD  aspirin 81 MG EC tablet Take 1 tablet (81 mg total) by mouth daily. Swallow whole. 04/27/13  Yes Lesle ChrisKaren M Black, NP  buPROPion (WELLBUTRIN XL) 150 MG 24 hr tablet Take 150 mg by mouth 2 (two) times daily.     Yes Historical Provider, MD  carisoprodol (SOMA) 350 MG tablet Take 350 mg by mouth 3 (three) times daily as  needed for muscle spasms (for  back spasms).   Yes Historical Provider, MD  diclofenac (VOLTAREN) 75 MG EC tablet Take 75 mg by mouth 2 (two) times daily.   Yes Historical Provider, MD  fenofibrate (TRICOR) 145 MG tablet Take 145 mg by mouth every morning.    Yes Historical Provider, MD  gabapentin (NEURONTIN) 300 MG capsule Take 300 mg by mouth every morning.   Yes Historical Provider, MD  HUMALOG KWIKPEN 100 UNIT/ML SOPN Inject 35-40 Units into the skin 3 (three) times daily with meals. As directed per sliding scale 05/02/13  Yes Historical Provider, MD  ibuprofen (ADVIL,MOTRIN) 200 MG tablet Take 800 mg by mouth 3 (three) times daily as needed for pain.    Yes Historical Provider, MD  LANTUS SOLOSTAR 100 UNIT/ML Solostar Pen Inject 60 Units into the skin 2 (two) times daily. 12/01/14  Yes Historical Provider, MD  lisinopril (PRINIVIL,ZESTRIL) 5 MG tablet Take 5 mg by mouth daily. 12/29/14  Yes Historical Provider, MD  meclizine (ANTIVERT) 25 MG tablet Take 25 mg by mouth 2 (two) times daily.  04/10/13  Yes Historical Provider, MD  metFORMIN (GLUCOPHAGE) 1000 MG tablet Take 1,000 mg by mouth 2 (two) times daily with a meal.     Yes Historical Provider, MD  omeprazole (PRILOSEC) 40 MG capsule Take 40  mg by mouth daily.  04/22/13  Yes Historical Provider, MD  oxyCODONE (ROXICODONE) 15 MG immediate release tablet Take 15 mg by mouth 5 (five) times daily.    Yes Historical Provider, MD  Rivaroxaban (XARELTO) 20 MG TABS Take 1 tablet (20 mg total) by mouth daily. 05/28/13  Yes Gwenyth Bender, NP  albuterol (PROAIR HFA) 108 (90 BASE) MCG/ACT inhaler Inhale 2 puffs into the lungs every 6 (six) hours as needed for wheezing or shortness of breath.     Historical Provider, MD  benzonatate (TESSALON) 100 MG capsule Take 1 capsule (100 mg total) by mouth every 8 (eight) hours. Patient not taking: Reported on 01/09/2015 04/01/14   [name], MD  diphenhydrAMINE (BENADRYL) 25 mg capsule Take 25 mg by mouth every 6 (six)  hours as needed for itching or allergies.    Historical Provider, MD  fluticasone (FLONASE) 50 MCG/ACT nasal spray Place 1 spray into the nose daily as needed for allergies or rhinitis.  08/22/13   Historical Provider, MD  levofloxacin (LEVAQUIN) 500 MG tablet Take 1 tablet (500 mg total) by mouth daily. Patient not taking: Reported on 01/09/2015 04/01/14   [name], MD  nitroGLYCERIN (NITROSTAT) 0.4 MG SL tablet Place 0.4 mg under the tongue every 5 (five) minutes as needed for chest pain.    Historical Provider, MD  oxyCODONE-acetaminophen (ROXICET) 5-325 MG per tablet Take 1 tablet by mouth every 6 (six) hours as needed for severe pain. 01/09/15   Michaeljames Milnes Orlene Och, NP  promethazine (PHENERGAN) 25 MG tablet Take 25 mg by mouth every 6 (six) hours as needed for nausea.     Historical Provider, MD   BP 122/85 mmHg  Pulse 111  Temp(Src) 99.2 F (37.3 C) (Oral)  Resp 20  Ht  (1.956 m)  Wt 298 lb (135.172 kg)  BMI 35.33 kg/m2  SpO2 100% Physical Exam  Constitutional: He is oriented to person, place, and time. He appears well-developed and well-nourished.  HENT:  Head: Normocephalic and atraumatic.  Eyes: EOM are normal.  Neck: Neck supple.  Cardiovascular: Normal rate.   Pulmonary/Chest: Effort normal.  Musculoskeletal:       Right hand: He exhibits decreased range of motion (due to pain), tenderness and swelling. He exhibits normal capillary refill. Normal strength noted.  Patient states his sensation is always decreased due to his diabetic neuropathy but is equal bilateral.   Neurological: He is alert and oriented to person, place, and time. No cranial nerve deficit.  Skin: Skin is warm and dry.  Psychiatric: He has a normal mood and affect. His behavior is normal.  Nursing note and vitals reviewed.   ED Course  Procedures (including critical care time) Labs Review Dg Hand Complete Right  01/09/2015   CLINICAL DATA:  RIGHT hand pain, closed car door on his hand, decreased mobility  and most of pain are along the metacarpals  EXAM: RIGHT HAND - COMPLETE 3+ VIEW  COMPARISON:  None.  FINDINGS: Mild soft tissue swelling dorsally overlying the metacarpal heads.  Osseous mineralization normal.  Joint spaces preserved.  No acute fracture, dislocation, or bone destruction.  A small artifact is present on all 3 images, of uncertain origin.  IMPRESSION: No acute osseous abnormalities.   Electronically Signed   By: Ulyses Southward M.D.   On: 01/09/2015 17:37    MDM  44 y.o. male with contusion to the right hand s/p injury. Stable for discharge without open wound or fracture. Ace wrap applied, ice, elevation  and pain management. He will return as needed for worsening symptoms.  Final diagnoses:  Contusion of right hand including fingers, initial encounter     [facility], NP 01/10/15 1610  Raeford Razor, MD 01/10/15 1600

## 2015-04-04 LAB — HEMOGLOBIN A1C: Hgb A1c MFr Bld: 11.1 % — AB (ref 4.0–6.0)

## 2015-04-29 ENCOUNTER — Other Ambulatory Visit: Payer: Self-pay | Admitting: *Deleted

## 2015-04-29 ENCOUNTER — Ambulatory Visit (INDEPENDENT_AMBULATORY_CARE_PROVIDER_SITE_OTHER): Payer: Medicare Other | Admitting: Endocrinology

## 2015-04-29 ENCOUNTER — Encounter: Payer: Self-pay | Admitting: Endocrinology

## 2015-04-29 VITALS — BP 112/74 | HR 101 | Temp 98.4°F | Resp 16 | Ht 76.0 in | Wt 323.6 lb

## 2015-04-29 DIAGNOSIS — E119 Type 2 diabetes mellitus without complications: Secondary | ICD-10-CM | POA: Insufficient documentation

## 2015-04-29 DIAGNOSIS — G629 Polyneuropathy, unspecified: Secondary | ICD-10-CM | POA: Diagnosis not present

## 2015-04-29 DIAGNOSIS — Z794 Long term (current) use of insulin: Secondary | ICD-10-CM | POA: Insufficient documentation

## 2015-04-29 DIAGNOSIS — E7800 Pure hypercholesterolemia, unspecified: Secondary | ICD-10-CM

## 2015-04-29 DIAGNOSIS — E1165 Type 2 diabetes mellitus with hyperglycemia: Secondary | ICD-10-CM

## 2015-04-29 DIAGNOSIS — E1142 Type 2 diabetes mellitus with diabetic polyneuropathy: Secondary | ICD-10-CM | POA: Diagnosis not present

## 2015-04-29 DIAGNOSIS — IMO0002 Reserved for concepts with insufficient information to code with codable children: Secondary | ICD-10-CM

## 2015-04-29 LAB — POCT GLUCOSE (DEVICE FOR HOME USE): Glucose Fasting, POC: 287 mg/dL — AB (ref 70–99)

## 2015-04-29 MED ORDER — INSULIN PEN NEEDLE 32G X 4 MM MISC: Status: DC

## 2015-04-29 MED ORDER — GLUCOSE BLOOD VI STRP: ORAL_STRIP | Status: DC

## 2015-04-29 MED ORDER — VICTOZA 18 MG/3ML ~~LOC~~ SOPN: 1.2000 mg | PEN_INJECTOR | Freq: Every day | SUBCUTANEOUS | Status: DC

## 2015-04-29 NOTE — Patient Instructions (Signed)
Please check blood sugars at least half the time about 2 hours after any meal and 3-4 times per week on waking up.  Please bring blood sugar monitor to each visit. Recommended blood sugar levels about 2 hours after meal is 140-180 and on waking up 90-130  Start VICTOZA injection as shown once daily at the same time of the day.  Dial the dose to 0.6 mg on the pen for the first week.  You may inject in the stomach, thigh or arm. You may experience nausea in the first few days which usually goes away.  You will feel fullness of the stomach with starting the medication and should try to keep the portions at meals small. After 1 week increase the dose to 1.2mg  daily if no nausea present.   If any questions or concerns are present call the office or the [facility] helpline at [phone]. Visit [email or web address] for more useful information  LANTUS insulin: Increased the dose to 70 units twice a day. If the morning sugar is starting to get below 100 may reduce the dose back to 60 After one week if morning sugars still high increase the evening dose to 75  HUMALOG insulin: May need to increase the dose if blood sugars after meals are consistently over 200 after one week of starting Victoza

## 2015-04-29 NOTE — Progress Notes (Signed)
Patient ID: Joseph [name], male   DOB: 15-Sep-1971, 44 y.o.   MRN: [id number]           Reason for Appointment: Consultation for Type 2 Diabetes  Referring physician: [place]  History of Present Illness:          Date of diagnosis of type 2 diabetes mellitus :  2002       Previous history: At diagnosis he was having symptoms of increased thirst and dizziness He was treated with certain oral  medications including metformin and Actos for some time initially but probably did not have consistent control Also was much more obese at that time Insulin was started several years ago, and has been taking Lantus for several years May have started taking Novolog or Humalog subsequently without adequate control He has generally been followed by a primary care physicians only A1c in 2014 was 11.3 and in 2015 has been as high as 13  Recent history:   INSULIN regimen is described as: Lantus 60 bid; Humalog 30-45 ac tid      His A1c has been over 11% since the evening of the year despite taking large doses of insulin. He has also continued metformin but has not tried any other medications with insulin He has not checked his blood sugar in 3 months as he does not have functional monitor and did not ask for anyone Previously when he was checking his blood sugar there were usually in the 330-400 range He has never been see a dietitian. Dietary history as below.  Current blood sugar patterns and problems identified:  He thinks his blood sugars usually are higher after meals compared to the mornings  He is not able to do much exercise except outside activities  Has not checked his blood sugar for 3 months  Although he does adjust his Humalog based on the type of carbohydrate he is eating and meal size he does not think his blood sugars improve after taking Humalog with his meals  He has lost some weight in the last year and does continue to have some increased thirst and fatigue     He does  drink about 30 ounces of regular [facility] daily although he does drink flavored sugar-free water also.  He does not have any specific meal plan  Oral hypoglycemic drugs the patient is taking are: Metformin 1 g twice a day      Side effects from medications have been: None  Compliance with the medical regimen: fair  Hypoglycemia: Never    Glucose monitoring:  none recently        Glucometer: none, .      Blood Glucose readings not available    Self-care: The diet that the patient has been following is: None.  tries to limit .     Meal times: Breakfast:none Lunch: Dinner:   Typical meal intake: Breakfast is frequently skipped.  He will have 2 sandwiches at lunch, usually has chicken and sometimes pasta dinnertime.  Eating out about once a week at fast food places               Dietician visit, most recent: Never               Exercise:  some yard work  Weight history: About 15 years ago he was weighing about 635 pounds and subsequently lost weight with dietary changes He thinks he has lost another 80 pounds over the last couple of years His lowest weight  has been 295   Wt Readings from Last 3 Encounters:  04/29/15 323 lb 9.6 oz (146.784 kg)  01/09/15 298 lb (135.172 kg)  04/01/14 332 lb (150.594 kg)    Glycemic control:    Lab Results  Component Value Date   HGBA1C 11.1* 04/04/2015   HGBA1C 11.3* 04/27/2013   Lab Results  Component Value Date   LDLCALC 170 01/06/2015   CREATININE 1.16 04/01/2014         Medication List       This list is accurate as of: 04/29/15  9:05 PM.  Always use your most recent med list.               ALPRAZolam 1 MG tablet  Commonly known as:  XANAX  Take 1 mg by mouth 3 (three) times daily.     amLODipine 2.5 MG tablet  Commonly known as:  NORVASC  Take 2.5 mg by mouth every morning.     aspirin 81 MG EC tablet  Take 1 tablet (81 mg total) by mouth daily. Swallow whole.     benzonatate 100 MG capsule  Commonly known as:   TESSALON  Take 1 capsule (100 mg total) by mouth every 8 (eight) hours.     buPROPion 150 MG 24 hr tablet  Commonly known as:  WELLBUTRIN XL  Take 150 mg by mouth 2 (two) times daily.     carisoprodol 350 MG tablet  Commonly known as:  SOMA  Take 350 mg by mouth 3 (three) times daily as needed for muscle spasms (for  back spasms).     diclofenac 75 MG EC tablet  Commonly known as:  VOLTAREN  Take 75 mg by mouth 2 (two) times daily.     diphenhydrAMINE 25 mg capsule  Commonly known as:  BENADRYL  Take 25 mg by mouth every 6 (six) hours as needed for itching or allergies.     fenofibrate 145 MG tablet  Commonly known as:  TRICOR  Take 145 mg by mouth every morning.     fluconazole 100 MG tablet  Commonly known as:  DIFLUCAN     fluticasone 50 MCG/ACT nasal spray  Commonly known as:  FLONASE  Place 1 spray into the nose daily as needed for allergies or rhinitis.     gabapentin 300 MG capsule  Commonly known as:  NEURONTIN  Take 300 mg by mouth every morning.     glucose blood test strip  Commonly known as:  ONETOUCH VERIO  Use to test blood sugar 3 times daily as instructed. Dx: E10.9     HUMALOG KWIKPEN 100 UNIT/ML KiwkPen  Generic drug:  insulin lispro  Inject 35-40 Units into the skin 3 (three) times daily with meals. As directed per sliding scale     ibuprofen 200 MG tablet  Commonly known as:  ADVIL,MOTRIN  Take 800 mg by mouth 3 (three) times daily as needed for pain.     Insulin Pen Needle 32G X 4 MM Misc  Use to inject insulin 4 times daily. Dx: E10.9     LANTUS SOLOSTAR 100 UNIT/ML Solostar Pen  Generic drug:  Insulin Glargine  Inject 60 Units into the skin 2 (two) times daily.     lisinopril 5 MG tablet  Commonly known as:  PRINIVIL,ZESTRIL  Take 5 mg by mouth daily.     LYRICA 50 MG capsule  Generic drug:  pregabalin     meclizine 25 MG tablet  Commonly known as:  ANTIVERT  Take 25 mg by mouth 2 (two) times daily.     metFORMIN 1000 MG tablet    Commonly known as:  GLUCOPHAGE  Take 1,000 mg by mouth 2 (two) times daily with a meal.     nitroGLYCERIN 0.4 MG SL tablet  Commonly known as:  NITROSTAT  Place 0.4 mg under the tongue every 5 (five) minutes as needed for chest pain.     omeprazole 40 MG capsule  Commonly known as:  PRILOSEC  Take 40 mg by mouth daily.     oxyCODONE 15 MG immediate release tablet  Commonly known as:  ROXICODONE  Take 15 mg by mouth 5 (five) times daily.     PROAIR HFA 108 (90 BASE) MCG/ACT inhaler  Generic drug:  albuterol  Inhale 2 puffs into the lungs every 6 (six) hours as needed for wheezing or shortness of breath.     promethazine 25 MG tablet  Commonly known as:  PHENERGAN  Take 25 mg by mouth every 6 (six) hours as needed for nausea.     rivaroxaban 20 MG Tabs tablet  Commonly known as:  XARELTO  Take 1 tablet (20 mg total) by mouth daily.     tiZANidine 4 MG tablet  Commonly known as:  ZANAFLEX     VICTOZA 18 MG/3ML Sopn  Generic drug:  Liraglutide  Inject 0.2 mLs (1.2 mg total) into the skin daily. Inject once daily at the same time        Allergies:  Allergies  Allergen Reactions  . Sulfa Antibiotics Other (See Comments)    headache    Past Medical History  Diagnosis Date  . CAD (coronary artery disease)   . Hypertension   . Hyperlipidemia   . Diabetes mellitus     Past Surgical History  Procedure Laterality Date  . Knee arthoplasty    . Carpel tunnel    . Shoulder surgery      No family history on file.  Social History:  reports that he has been smoking Cigarettes.  He has been smoking about 1.50 packs per day. He does not have any smokeless tobacco history on file. He reports that he drinks alcohol. He reports that he does not use illicit drugs.    Review of Systems    Lipid history: He is not taking any statin drugs.  Not clear if he has been intolerant of these.  Was prescribed Praluent in December but not taking this now, most recent LDL was 170     Lab Results  Component Value Date   LDLCALC 170 01/06/2015           Constitutional: no recent weight gain, Has some complaints of fatigue   Eyes: no history of blurred vision.  Most recent eye exam was 10/15, apparently no retinopathy  ENT: no difficulty swallowing, no hoarseness   Cardiovascular: no chest pain or tightness on exertion.  No leg swelling.  Hypertension: More recently has been fairly well controlled, previously had no problems when he was obese, is on low dose lisinopril and Norvasc Taking Xarelto for history of pulmonary embolism  Respiratory: no cough/shortness of breath Not using CPAP for his sleep apnea which has been diagnosed previously.  Does tend to get tired and sleepy during the day.   Gastrointestinal: no constipation, diarrhea, vomiting or abdominal pain. He will occasionally get nauseated but usually when he gets hot.  Will take Phenergan as needed.  Does not complain of early satiety when eating  Musculoskeletal:  no muscle cramps.  Has problems with knee joint pain and is awaiting arthroscopy    Urological:   No frequency of urination or  nocturia  Skin: no rash or infections  Neurological: no headaches.  Has long-standing history of  Numbness in hands and lower legs along with some , burning, pains and tingling in feet Treatment is with  Lyrica 50 bid, along with gabapentin 300 mg also Balance difficulties: Present, occasionally will tend to lose balance while walking  Psychiatric: Is on treatment for depression Does have a history of insomnia  Endocrine: No unusual fatigue, cold intolerance or history of thyroid disease  He has had erectile dysfunction   LABS:  Office Visit on 04/29/2015  Component Date Value Ref Range Status  . Glucose Fasting, POC 04/29/2015 287* 70 - 99 mg/dL Final  . LDL Cholesterol 01/06/2015 170   Final  . Hgb A1c MFr Bld 04/04/2015 11.1* 4.0 - 6.0 % Final    Physical Examination:  BP 112/74 mmHg  Pulse 101   Temp(Src) 98.4 F (36.9 C)  Resp 16  Ht  (1.93 m)  Wt 323 lb 9.6 oz (146.784 kg)  BMI 39.41 kg/m2  SpO2 97%  GENERAL:         Patient has generalized obesity.   HEENT:         Eye exam shows normal external appearance. Fundus exam shows no retinopathy. Oral exam shows relatively dry mucosa .  NECK:         General:  Neck exam shows no lymphadenopathy. Carotids are normal to palpation and no bruit heard.  Thyroid is just palpable on the right side and no nodules felt.   LUNGS:         Chest is symmetrical. Lungs are clear to auscultation.Marland Kitchen   HEART:         Heart sounds:  S1 and S2 are normal. No murmurs or clicks heard., no S3 or S4.   ABDOMEN:   There is no distention present. Liver and spleen are not palpable. No other mass or tenderness present.  EXTREMITIES:     There is no edema. No skin lesions present.Marland Kitchen  NEUROLOGICAL:   Vibration sense is  absent in toes. Ankle jerks are absent bilaterally.          Absent monofilament sensation in the feet and distal fingers Absent left pedal pulses and 2+ right posterior tibialis pulse MUSCULOSKELETAL:  There is no swelling or deformity of the peripheral joints. Spine is normal to inspection.   SKIN:       No rash or lesions of concern.        ASSESSMENT:  Diabetes type 2, uncontrolled with history of severe obesity He is still quite insulin resistant with taking well over 200 units of insulin and poor control Apparently has a history of A1c readings at least over 11% for some time However has had overall inadequate compliance with diet and not able to exercise much Does not appear to be benefiting much from metformin    He is a good candidate for adding a GLP-1 drug Discussed with the patient the nature of GLP-1 drugs, the actions on various organ systems, how they benefit blood glucose control, as well as the benefit of weight loss and  increase satiety . Explained possible side effects especially nausea and vomiting initially; discussed  safety information in package insert.  Described the injection technique and dosage titration of Victoza  starting with 0.6 mg once a day  at the same time for the first week and then increasing to 1.2 mg if no symptoms of nausea.  Educational brochure on Victoza and co-pay card given  Complications: Severe peripheral neuropathy with sensory loss, unknown microalbumin level  Hyperlipidemia, appears to be currently untreated, will need to get more information from PCP about prior treatment   SLEEP apnea: Currently untreated and reminded him to start using the CPAP again  History of mild hypertension: Appears well controlled  PLAN:  1. Start Victoza as above in addition to his insulin regimen 2. Meanwhile will increase his Lantus insulin by at least 10 units twice a day.  Discussed adjusting Lantus based on fasting blood sugar trend  3. Humalog will be adjusted based on his postprandial readings and his insulin requirement may be reduced once Victoza is effective and he improves his diet as well as reduce his portions  4. Advised him to keep portions smaller at meals especially when to starting Victoza  5. Does need to restart home glucose monitoring.  Discussed timing and frequency of glucose monitoring, blood sugar targets 6. He was given him Verio glucose monitor and instructed on its use 7. Also need significant amount of diabetes education, will schedule him to see the dietitian on the next visit 8. He was advised to eliminate regular soft drinks. 9.  10. He will have urine microalbumin checked when blood sugars better controlled  11.  He also needs follow-up of his lipids. 12. He will need to observe precautions with foot care with avoiding going barefoot, prevention against falls or exposure of feet to hot water 13. He will discuss treatment with Lamisil for his onychomycosis with PCP 14. Consider simplifying his treatment for neuropathy with using only Lyrica instead of Lyrica and  gabapentin together   Patient Instructions  Please check blood sugars at least half the time about 2 hours after any meal and 3-4 times per week on waking up.  Please bring blood sugar monitor to each visit. Recommended blood sugar levels about 2 hours after meal is 140-180 and on waking up 90-130  Start VICTOZA injection as shown once daily at the same time of the day.  Dial the dose to 0.6 mg on the pen for the first week.  You may inject in the stomach, thigh or arm. You may experience nausea in the first few days which usually goes away.  You will feel fullness of the stomach with starting the medication and should try to keep the portions at meals small. After 1 week increase the dose to 1.2mg  daily if no nausea present.   If any questions or concerns are present call the office or the [facility] helpline at [phone]. Visit [email or web address] for more useful information  LANTUS insulin: Increased the dose to 70 units twice a day. If the morning sugar is starting to get below 100 may reduce the dose back to 60 After one week if morning sugars still high increase the evening dose to 75  HUMALOG insulin: May need to increase the dose if blood sugars after meals are consistently over 200 after one week of starting Victoza    Counseling time on subjects discussed above is over 50% of today's 60 minute visit  Kiran Lapine 04/29/2015, 9:05 PM   Note: This office note was prepared with Insurance underwriter. Any transcriptional errors that result from this process are unintentional.

## 2015-05-05 ENCOUNTER — Telehealth: Payer: Self-pay | Admitting: Endocrinology

## 2015-05-05 NOTE — Telephone Encounter (Signed)
Patient stated that there is no control solution in meter to determine his b/s, please advise

## 2015-05-22 ENCOUNTER — Ambulatory Visit: Payer: Medicare Other | Admitting: Endocrinology

## 2015-05-22 ENCOUNTER — Encounter: Payer: Medicare Other | Admitting: Dietician

## 2015-05-22 ENCOUNTER — Other Ambulatory Visit: Payer: Medicare Other

## 2015-05-28 ENCOUNTER — Encounter: Payer: Self-pay | Admitting: Endocrinology

## 2015-05-28 ENCOUNTER — Ambulatory Visit (INDEPENDENT_AMBULATORY_CARE_PROVIDER_SITE_OTHER): Payer: Medicare Other | Admitting: Endocrinology

## 2015-05-28 VITALS — BP 114/74 | HR 105 | Temp 97.7°F | Resp 16 | Ht 76.0 in | Wt 324.0 lb

## 2015-05-28 DIAGNOSIS — G4733 Obstructive sleep apnea (adult) (pediatric): Secondary | ICD-10-CM

## 2015-05-28 DIAGNOSIS — E1142 Type 2 diabetes mellitus with diabetic polyneuropathy: Secondary | ICD-10-CM

## 2015-05-28 DIAGNOSIS — IMO0002 Reserved for concepts with insufficient information to code with codable children: Secondary | ICD-10-CM

## 2015-05-28 DIAGNOSIS — G629 Polyneuropathy, unspecified: Secondary | ICD-10-CM

## 2015-05-28 DIAGNOSIS — E7800 Pure hypercholesterolemia, unspecified: Secondary | ICD-10-CM

## 2015-05-28 DIAGNOSIS — E1165 Type 2 diabetes mellitus with hyperglycemia: Secondary | ICD-10-CM | POA: Diagnosis not present

## 2015-05-28 DIAGNOSIS — E785 Hyperlipidemia, unspecified: Secondary | ICD-10-CM | POA: Diagnosis not present

## 2015-05-28 LAB — MICROALBUMIN / CREATININE URINE RATIO
Creatinine,U: 269.8 mg/dL
Microalb Creat Ratio: 1.2 mg/g (ref 0.0–30.0)
Microalb, Ur: 3.3 mg/dL — ABNORMAL HIGH (ref 0.0–1.9)

## 2015-05-28 LAB — BASIC METABOLIC PANEL
BUN: 24 mg/dL — ABNORMAL HIGH (ref 6–23)
CO2: 25 mEq/L (ref 19–32)
Calcium: 9.3 mg/dL (ref 8.4–10.5)
Chloride: 104 mEq/L (ref 96–112)
Creatinine, Ser: 1.09 mg/dL (ref 0.40–1.50)
GFR: 78.79 mL/min (ref >60.00)
Glucose, Bld: 152 mg/dL — ABNORMAL HIGH (ref 70–99)
Potassium: 3.8 mEq/L (ref 3.5–5.1)
Sodium: 135 mEq/L (ref 135–145)

## 2015-05-28 LAB — LIPID PANEL
Cholesterol: 174 mg/dL (ref 0–200)
HDL: 23.9 mg/dL — ABNORMAL LOW (ref >39.00)
NonHDL: 150.1
Total CHOL/HDL Ratio: 7
Triglycerides: 240 mg/dL — ABNORMAL HIGH (ref 0.0–149.0)
VLDL: 48 mg/dL — ABNORMAL HIGH (ref 0.0–40.0)

## 2015-05-28 LAB — LDL CHOLESTEROL, DIRECT: Direct LDL: 120 mg/dL

## 2015-05-28 NOTE — Patient Instructions (Addendum)
Lantus 55 units at 2 pm and 70 units at 4 am  Note that if you are getting low blood sugars at bedtime will need to reduce the dose by 5 units in the morning   Also if your waking up with HIGH blood sugars will need to INCREASE the Lantus at bedtime by 5 units   Humalog 15-20 units before meals  Check blood sugars on waking up ..3-4. times a week Also check blood sugars about 2 hours after a meal and do this after different meals by rotation Mark the readings on the meter when you are checking them after meals  Recommended blood sugar levels on waking up is 90-130 and about 2 hours after meal is 140-180 Please bring blood sugar monitor to each visit.  Use juice or regular soft drink for treating low blood sugar  Try to use a low sugar Gatorade

## 2015-05-28 NOTE — Progress Notes (Signed)
Patient ID: Joseph [name], male   DOB: 04/24/1971, 44 y.o.   MRN: [id number]           Reason for Appointment: Follow-up for Type 2 Diabetes  Referring physician: [place]  History of Present Illness:          Date of diagnosis of type 2 diabetes mellitus :  2002       Previous history: At diagnosis he was having symptoms of increased thirst and dizziness He was treated with certain oral  medications including metformin and Actos for some time initially but probably did not have consistent control Also was much more obese at that time Insulin was started several years ago, and has been taking Lantus for several years May have started taking Novolog or Humalog subsequently without adequate control He has generally been followed by a primary care physicians only A1c in 2014 was 11.3 and in 2015 has been as high as 13  Recent history:   INSULIN regimen is described as: Lantus 70 at 2 pm and 4 am; Humalog 20-35 ac tid      His A1c has been over 11% prior to his consultation despite taking large doses of insulin. Previously when he was checking his blood sugar there were usually in the 330-400 range  Because of his poor control and obesity was started on Victoza which he has titrated up to 1.2 mg without side effects His Lantus was increased by 10 units twice a day because of persistently high readings throughout the day  Current blood sugar patterns and problems identified:  He has started checking his blood sugars although somewhat infrequently and at random intervals; is not marking his postprandial readings  His blood sugars have overall improved markedly with titrating the Victoza to 1.2 mg and he is feeling jittery with the lower blood sugars although this is improving  He has had sporadic episodes of hypoglycemia, some after his meals and a couple of times before bedtime and before evening meal  He has reduced his HUMALOG, previously taking up to 45 units.  However not clear  what his postprandial readings are; these appear to be relatively good  His morning sugars are relatively high but he thinks that this is after drinking Gatorade in the morning  He has tried to cut back on regular soft drinks but not completely  Still not very active because of neuropathy his blood sugars usually are higher after meals compared to the mornings  He is not able to do much exercise except outside activities  He does not have any specific meal plan and did not show up for his appointment with the dietitian.  With Victoza he has cut back on portions but has not lost any weight yet  Oral hypoglycemic drugs the patient is taking are: Metformin 1 g twice a day      Side effects from medications have been: None  Compliance with the medical regimen: fair  Hypoglycemia: As above    Glucose monitoring:  less than once a day recently        Glucometer:  Verio flex .      Blood Glucose readings by download    Mean values apply above for all meters except median for One Touch  PRE-MEAL  before bfst Lunch Dinner Bedtime Overall  Glucose range: 106-215   65-172   70, 69    Mean/median:  188      141    POST-MEAL PC Breakfast PC Lunch PC  Dinner  Glucose range:    61-210   Mean/median:       Self-care: The diet that the patient has been following is: None.    Meal times: Breakfast:none Lunch: 4 pm  Dinner:10 pm   Typical meal intake: Breakfast is frequently skipped.  He will have 2 sandwiches at lunch, usually has chicken and sometimes pasta dinnertime.  Eating out about once a week at fast food places               Dietician visit, most recent: Never               Exercise:  some yard work  Weight history: About 15 years ago he was weighing about 635 pounds and subsequently lost weight with dietary changes He thinks he has lost another 80 pounds over the last couple of years His lowest weight has been 295   Wt Readings from Last 3 Encounters:  05/28/15 324 lb (146.965 kg)    04/29/15 323 lb 9.6 oz (146.784 kg)  01/09/15 298 lb (135.172 kg)    Glycemic control:    Lab Results  Component Value Date   HGBA1C 11.1* 04/04/2015   HGBA1C 11.3* 04/27/2013   Lab Results  Component Value Date   LDLCALC 170 01/06/2015   CREATININE 1.16 04/01/2014         Medication List       This list is accurate as of: 05/28/15  9:54 AM.  Always use your most recent med list.               ALPRAZolam 1 MG tablet  Commonly known as:  XANAX  Take 1 mg by mouth 3 (three) times daily.     amLODipine 2.5 MG tablet  Commonly known as:  NORVASC  Take 2.5 mg by mouth every morning.     aspirin 81 MG EC tablet  Take 1 tablet (81 mg total) by mouth daily. Swallow whole.     buPROPion 150 MG 24 hr tablet  Commonly known as:  WELLBUTRIN XL  Take 150 mg by mouth 2 (two) times daily.     carisoprodol 350 MG tablet  Commonly known as:  SOMA  Take 350 mg by mouth 3 (three) times daily as needed for muscle spasms (for  back spasms).     diclofenac 75 MG EC tablet  Commonly known as:  VOLTAREN  Take 75 mg by mouth 2 (two) times daily.     diphenhydrAMINE 25 mg capsule  Commonly known as:  BENADRYL  Take 25 mg by mouth every 6 (six) hours as needed for itching or allergies.     fenofibrate 145 MG tablet  Commonly known as:  TRICOR  Take 145 mg by mouth every morning.     fluconazole 100 MG tablet  Commonly known as:  DIFLUCAN     fluticasone 50 MCG/ACT nasal spray  Commonly known as:  FLONASE  Place 1 spray into the nose daily as needed for allergies or rhinitis.     gabapentin 300 MG capsule  Commonly known as:  NEURONTIN  Take 300 mg by mouth every morning.     glucose blood test strip  Commonly known as:  ONETOUCH VERIO  Use to test blood sugar 3 times daily as instructed. Dx: E10.9     HUMALOG KWIKPEN 100 UNIT/ML KiwkPen  Generic drug:  insulin lispro  Inject 35-40 Units into the skin 3 (three) times daily with meals. As directed per sliding scale  ibuprofen 200 MG tablet  Commonly known as:  ADVIL,MOTRIN  Take 800 mg by mouth 3 (three) times daily as needed for pain.     Insulin Pen Needle 32G X 4 MM Misc  Use to inject insulin 4 times daily. Dx: E10.9     LANTUS SOLOSTAR 100 UNIT/ML Solostar Pen  Generic drug:  Insulin Glargine  Inject 70 Units into the skin 2 (two) times daily.     lisinopril 5 MG tablet  Commonly known as:  PRINIVIL,ZESTRIL  Take 5 mg by mouth daily.     LYRICA 50 MG capsule  Generic drug:  pregabalin     meclizine 25 MG tablet  Commonly known as:  ANTIVERT  Take 25 mg by mouth 2 (two) times daily.     metFORMIN 1000 MG tablet  Commonly known as:  GLUCOPHAGE  Take 1,000 mg by mouth 2 (two) times daily with a meal.     nitroGLYCERIN 0.4 MG SL tablet  Commonly known as:  NITROSTAT  Place 0.4 mg under the tongue every 5 (five) minutes as needed for chest pain.     omeprazole 40 MG capsule  Commonly known as:  PRILOSEC  Take 40 mg by mouth daily.     oxyCODONE 15 MG immediate release tablet  Commonly known as:  ROXICODONE  Take 15 mg by mouth 5 (five) times daily.     PROAIR HFA 108 (90 BASE) MCG/ACT inhaler  Generic drug:  albuterol  Inhale 2 puffs into the lungs every 6 (six) hours as needed for wheezing or shortness of breath.     promethazine 25 MG tablet  Commonly known as:  PHENERGAN  Take 25 mg by mouth every 6 (six) hours as needed for nausea.     rivaroxaban 20 MG Tabs tablet  Commonly known as:  XARELTO  Take 1 tablet (20 mg total) by mouth daily.     tiZANidine 4 MG tablet  Commonly known as:  ZANAFLEX     VICTOZA 18 MG/3ML Sopn  Generic drug:  Liraglutide  Inject 0.2 mLs (1.2 mg total) into the skin daily. Inject once daily at the same time        Allergies:  Allergies  Allergen Reactions  . Sulfa Antibiotics Other (See Comments)    headache    Past Medical History  Diagnosis Date  . CAD (coronary artery disease)   . Hypertension   . Hyperlipidemia   .  Diabetes mellitus     Past Surgical History  Procedure Laterality Date  . Knee arthoplasty    . Carpel tunnel    . Shoulder surgery      No family history on file.  Social History:  reports that he has been smoking Cigarettes.  He has been smoking about 1.50 packs per day. He does not have any smokeless tobacco history on file. He reports that he drinks alcohol. He reports that he does not use illicit drugs.    Review of Systems    Lipid history: He is not taking any statin drugs.  Not clear if he has been intolerant of these.  Was prescribed Praluent in December but not taking this now, most recent LDL was 170    Lab Results  Component Value Date   LDLCALC 170 01/06/2015           Respiratory: Not using CPAP for his sleep apnea which has been diagnosed previously even though he was reminded to start doing this   Does tend to  get tired and sleepy during the day.    Neurological: no headaches.  Has long-standing history of  Numbness in hands and lower legs along with some , burning, pains and tingling in feet Treatment is with  Lyrica 50 bid-qid, along with gabapentin 300 mg also and prescribed by PCP  Last with exam in 5/16 showed: Absent monofilament sensation in the feet and distal fingers Absent left pedal pulses and 2+ right posterior tibialis pulse    LABS:  No visits with results within 1 Week(s) from this visit. Latest known visit with results is:  Office Visit on 04/29/2015  Component Date Value Ref Range Status  . Glucose Fasting, POC 04/29/2015 287* 70 - 99 mg/dL Final  . LDL Cholesterol 01/06/2015 170   Final  . Hgb A1c MFr Bld 04/04/2015 11.1* 4.0 - 6.0 % Final    Physical Examination:  BP 114/74 mmHg  Pulse 105  Temp(Src) 97.7 F (36.5 C)  Resp 16  Ht 6\' 4"  (1.93 m)  Wt 324 lb (146.965 kg)  BMI 39.45 kg/m2  SpO2 96%         ASSESSMENT:  Diabetes type 2, uncontrolled with history of severe obesity He is responding very well to using  Victoza and his blood sugars are dramatically better along with needing much less mealtime coverage He is doing a little better with avoiding regular soft drinks but still does not have a meal plan and has not seen the dietitian as directed See history of present illness for detailed discussion of his current management, blood sugar patterns and problems identified  Hyperlipidemia, appears to be currently untreated, will check his lipids today and decide on further treatment  SLEEP apnea: Currently untreated and reminded him to start using the CPAP again   PLAN:  1. Start labeling the readings after meals to identify postprandial readings.  He will be given the One Touch Verio monitor 2. More consistent monitoring at various times as discussed including on waking up 3. Discussed how to adjust the Lantus in the morning and evening based on blood sugars before supper and waking up respectively 4. Reduce Humalog further to avoid hypoglycemia 5. Discussed appropriate treatment for hypoglycemia 6. Consider simplifying his treatment for neuropathy with using only Lyrica instead of Lyrica and gabapentin together   Patient Instructions  Lantus 55 units at 2 pm and 70 units at 4 am  Note that if you are getting low blood sugars at bedtime will need to reduce the dose by 5 units in the morning   Also if your waking up with HIGH blood sugars will need to INCREASE the Lantus at bedtime by 5 units   Humalog 15-20 units before meals  Check blood sugars on waking up ..3-4. times a week Also check blood sugars about 2 hours after a meal and do this after different meals by rotation Mark the readings on the meter when you are checking them after meals  Recommended blood sugar levels on waking up is 90-130 and about 2 hours after meal is 140-180 Please bring blood sugar monitor to each visit.  Use juice or regular soft drink for treating low blood sugar  Try to use a low sugar  Gatorade      Counseling time on subjects discussed above is over 50% of today's 60 minute visit  Arhianna Ebey 05/28/2015, 9:54 AM   Note: This office note was prepared with [employer] underwriter[employer] voice recognition system technology. Any transcriptional errors that result from this process are unintentional.

## 2015-05-29 LAB — FRUCTOSAMINE: Fructosamine: 262 umol/L (ref 0–285)

## 2015-06-10 ENCOUNTER — Encounter: Payer: Medicare Other | Attending: Endocrinology | Admitting: Nutrition

## 2015-06-10 VITALS — Wt 319.0 lb

## 2015-06-10 DIAGNOSIS — E1165 Type 2 diabetes mellitus with hyperglycemia: Secondary | ICD-10-CM | POA: Diagnosis present

## 2015-06-10 DIAGNOSIS — IMO0002 Reserved for concepts with insufficient information to code with codable children: Secondary | ICD-10-CM

## 2015-06-10 DIAGNOSIS — Z794 Long term (current) use of insulin: Secondary | ICD-10-CM | POA: Insufficient documentation

## 2015-06-10 DIAGNOSIS — Z713 Dietary counseling and surveillance: Secondary | ICD-10-CM | POA: Diagnosis not present

## 2015-06-11 NOTE — Progress Notes (Signed)
Patient is here with his wife.  He reports that he has started taking the Victoza and feels like he is eating less, and feeling full longer, and that his blood sugars are much better.  He has stopped most of his sweet drinks, except for an occasional sweet tea "on occasion", and is drinking mostly water or diet drinks.   Diet:  He is now eating only 1-2 meals/day and taking novolog 30-35u before meals.  He does not reduce this when he is active after eating, and will drop low most times when he is doing yard work or any other outdoor activity.   Bfast:  2 sandwiches, or sausage biscuits.  Water or diet drink.  He will usually take between 24-30u for this. 2nd meal:  Usually supper:  6-8 ounce protein, 2 starchy veg., and 2 non starchy veg.  He wil lusually take 35u for this.  He will drink sweet tea if eating out, and takes 5 extra units when doing this.   His Lantus dose is 70u BID.  SBGM:  He did not bring his meter.  Recall says FBS are now less than 150, and acS is sometime low (due to exercise, or less than 130.    Suggestions given: decrease premeal does of Novolog when exercisign from 30 to 24u and test acS to see if the blood sugar is normal.  If low, reduce the premeal dose to 22u.  If high, reduce the premeal dose to 26u.  He reported good understanding of this.

## 2015-06-17 NOTE — Patient Instructions (Signed)
1. decrease premeal does of Novolog when exercising after the meal, from 30 to 24u, and test acS to see if the blood sugar is normal.  If low, reduce the premeal dose to 22u.  If high, reduce the premeal dose to 26u.   2.  Call if blood sugars are dropping low during the night, or daytime.

## 2015-06-25 ENCOUNTER — Ambulatory Visit: Payer: Medicare Other | Admitting: Endocrinology

## 2015-07-28 NOTE — Progress Notes (Signed)
Cardiology Office Note   Date:  07/29/2015   ID:  Joseph [name], DOB [birth year], MRN [id number]  PCP:  Egbert Garibaldi, NP  Cardiologist:   Madilyn Hook, MD   Chief Complaint  Patient presents with  . New Evaluation  . Shortness of Breath    on exertion  . Edema    from injuries and arthritis  . Dizziness    when he gets really hot, on exertion      History of Present Illness: Joseph [name] is a 44 y.o. male with CAD, HTN, HL, DM Type II (A1c 7.1 07/2015), and OSA who presents for presurgical evaluation.  Joseph [name] is preparing for surgery on his R knee and L hip.   He does not get much exercise because of pain in his knees and hip. The most is collectively he does is using a weed eater or mowing the lawn. He has difficulty walking more than 50 feet due to pain. He is unable to ambulate upstairs. He denies chest pain or shortness of breath. He also denies palpitations, lightheadedness or dizziness. Joseph [name] underwent dobutamine Myoview in 2011 that was reportedly normal.   Joseph [name] continues to smoke approximately 1/2-3/4 of a pack of cigarettes daily in the past he smoked up to 3 packs per day since age 14. Not interested in assistance with further decreasing his smoking and plans to quit on his own. He was previously over 600 pounds but has lost weight by decreasing the amount of food that he eats.    Past Medical History  Diagnosis Date  . CAD (coronary artery disease)   . Hypertension   . Hyperlipidemia   . Diabetes mellitus     Past Surgical History  Procedure Laterality Date  . Knee arthoplasty    . Carpel tunnel    . Shoulder surgery       Current Outpatient Prescriptions  Medication Sig Dispense Refill  . albuterol (PROAIR HFA) 108 (90 BASE) MCG/ACT inhaler Inhale 2 puffs into the lungs every 6 (six) hours as needed for wheezing or shortness of breath.     . ALPRAZolam (XANAX) 1 MG tablet Take 1 mg by mouth 3 (three) times daily.       Marland Kitchen amLODipine (NORVASC) 2.5 MG tablet Take 2.5 mg by mouth every morning.     Marland Kitchen aspirin 81 MG EC tablet Take 1 tablet (81 mg total) by mouth daily. Swallow whole. 30 tablet 12  . buPROPion (WELLBUTRIN XL) 150 MG 24 hr tablet Take 150 mg by mouth 2 (two) times daily.      . diclofenac (VOLTAREN) 75 MG EC tablet Take 75 mg by mouth 2 (two) times daily.    . diphenhydrAMINE (BENADRYL) 25 mg capsule Take 25 mg by mouth every 6 (six) hours as needed for itching or allergies.    . fenofibrate (TRICOR) 145 MG tablet Take 145 mg by mouth every morning.     . fluconazole (DIFLUCAN) 100 MG tablet   0  . fluticasone (FLONASE) 50 MCG/ACT nasal spray Place 1 spray into the nose daily as needed for allergies or rhinitis.     Marland Kitchen gabapentin (NEURONTIN) 300 MG capsule Take 300 mg by mouth every morning.    Marland Kitchen glucose blood (ONETOUCH VERIO) test strip Use to test blood sugar 3 times daily as instructed. Dx: E10.9 100 each 5  . HUMALOG KWIKPEN 100 UNIT/ML SOPN Inject 35-40 Units into the skin 3 (three) times daily with  meals. As directed per sliding scale    . ibuprofen (ADVIL,MOTRIN) 200 MG tablet Take 800 mg by mouth 3 (three) times daily as needed for pain.     . Insulin Pen Needle 32G X 4 MM MISC Use to inject insulin 4 times daily. Dx: E10.9 130 each 5  . LANTUS SOLOSTAR 100 UNIT/ML Solostar Pen Inject 70 Units into the skin 2 (two) times daily.     Marland Kitchen lisinopril (PRINIVIL,ZESTRIL) 5 MG tablet Take 5 mg by mouth daily.    Marland Kitchen LYRICA 50 MG capsule   5  . meclizine (ANTIVERT) 25 MG tablet Take 25 mg by mouth 2 (two) times daily.     . metFORMIN (GLUCOPHAGE) 1000 MG tablet Take 1,000 mg by mouth 2 (two) times daily with a meal.      . nitroGLYCERIN (NITROSTAT) 0.4 MG SL tablet Place 0.4 mg under the tongue every 5 (five) minutes as needed for chest pain.    Marland Kitchen omeprazole (PRILOSEC) 40 MG capsule Take 40 mg by mouth daily.     Marland Kitchen oxyCODONE (ROXICODONE) 15 MG immediate release tablet Take 15 mg by mouth 5 (five) times  daily.     . promethazine (PHENERGAN) 25 MG tablet Take 25 mg by mouth every 6 (six) hours as needed for nausea.     . Rivaroxaban (XARELTO) 20 MG TABS Take 1 tablet (20 mg total) by mouth daily. 30 tablet 1  . tiZANidine (ZANAFLEX) 4 MG tablet   0  . VICTOZA 18 MG/3ML SOPN Inject 0.2 mLs (1.2 mg total) into the skin daily. Inject once daily at the same time 2 pen 3  . atorvastatin (LIPITOR) 80 MG tablet Take 1 tablet (80 mg total) by mouth daily. 30 tablet 11   No current facility-administered medications for this visit.    Allergies:   Sulfa antibiotics    Social History:  The patient  reports that he has been smoking Cigarettes.  He has been smoking about 1.50 packs per day. He does not have any smokeless tobacco history on file. He reports that he drinks alcohol. He reports that he does not use illicit drugs.   Family History:  The patient's family history includes Hypertension in his brother and father.    ROS:  Please see the history of present illness.   Otherwise, review of systems are positive for none.   All other systems are reviewed and negative.    PHYSICAL EXAM: VS:  BP 104/86 mmHg  Pulse 110  Ht 6\' 5"  (1.956 m)  Wt 144.697 kg (319 lb)  BMI 37.82 kg/m2 , BMI Body mass index is 37.82 kg/(m^2). GENERAL:  Well appearing HEENT:  Pupils equal round and reactive, fundi not visualized, oral mucosa unremarkable NECK:  No jugular venous distention, waveform within normal limits, carotid upstroke brisk and symmetric, no bruits, no thyromegaly LYMPHATICS:  No cervical adenopathy LUNGS:  Clear to auscultation bilaterally HEART:  RRR.  PMI not displaced or sustained,S1 and S2 within normal limits, no S3, no S4, no clicks, no rubs, no murmurs ABD:  Flat, positive bowel sounds normal in frequency in pitch, no bruits, no rebound, no guarding, no midline pulsatile mass, no hepatomegaly, no splenomegaly EXT:  2 plus pulses throughout, no edema, no cyanosis no clubbing SKIN:  No rashes  no nodules NEURO:  Cranial nerves II through XII grossly intact, motor grossly intact throughout PSYCH:  Cognitively intact, oriented to person place and time    EKG:  EKG is ordered today. The  ekg ordered today demonstrates sinus tachycardia at 110 bpm.  Absent R wave progression.  Non-specific ST elevation V2.  Prior inferior MI.   Recent Labs: 05/28/2015: BUN 24*; Creatinine, Ser 1.09; Potassium 3.8; Sodium 135    Lipid Panel    Component Value Date/Time   CHOL 174 05/28/2015 0951   TRIG 240.0* 05/28/2015 0951   HDL 23.90* 05/28/2015 0951   CHOLHDL 7 05/28/2015 0951   VLDL 48.0* 05/28/2015 0951   LDLCALC 170 01/06/2015   LDLDIRECT 120.0 05/28/2015 0951      Wt Readings from Last 3 Encounters:  07/29/15 144.697 kg (319 lb)  06/11/15 144.697 kg (319 lb)  05/28/15 146.965 kg (324 lb)      Other studies Reviewed: Additional studies/ records that were reviewed today include:  Review of the above records demonstrates:  Please see elsewhere in the note.     ASSESSMENT AND PLAN:  # Presurgical evaluation: Joseph [name] is unable to perfrom 4 METs of activity due to his orthopedic limitations.  Given his hypertension, hyperlipidemia, diabetes and obesity he is at higher risk of coronary vascular disease.  Therefore we will refer him for stress testing prior to his surgery. - Lexiscan Myoview  # CV Disease prevention: Joseph [name] has DM and a report of HL, though he is not currently on a statin.  His 10 year risk of ASCVD is 13.2%.  Therefore, we recommend starting atorvastatin  daily.  He takes fluconazole weekly, which can raise the concentration of atorvastatin.  Therefore, we are only treating at moderate dose. - Atorvastatin  daily - Check lipids and LFTs in 3 months - Increase exercise after joint replacement surgery  # Smoking cessation: We discussed tobacco use.  Joseph [name] is continuing to cut back on his smoking.  He is not interested in pharmacologic  intervention at this time.     Current medicines are reviewed at length with the patient today.  The patient does not have concerns regarding medicines.  The following changes have been made:  no change  Labs/ tests ordered today include: Lexiscan Myoview   Orders Placed This Encounter  Procedures  . Lipid panel  . Hepatic function panel  . Hepatic function panel  . Myocardial Perfusion Imaging  . EKG 12-Lead     Disposition:   FU with Dr. Elmarie Shiley C. [place] in 6 months    Signed, Madilyn Hook, MD  07/29/2015 5:05 PM    [facility]

## 2015-07-29 ENCOUNTER — Ambulatory Visit (INDEPENDENT_AMBULATORY_CARE_PROVIDER_SITE_OTHER): Payer: Medicare Other | Admitting: Cardiovascular Disease

## 2015-07-29 ENCOUNTER — Other Ambulatory Visit: Payer: Self-pay | Admitting: *Deleted

## 2015-07-29 ENCOUNTER — Encounter: Payer: Self-pay | Admitting: Cardiovascular Disease

## 2015-07-29 VITALS — BP 104/86 | HR 110 | Ht 77.0 in | Wt 319.0 lb

## 2015-07-29 DIAGNOSIS — Z79899 Other long term (current) drug therapy: Secondary | ICD-10-CM

## 2015-07-29 DIAGNOSIS — I10 Essential (primary) hypertension: Secondary | ICD-10-CM | POA: Diagnosis not present

## 2015-07-29 DIAGNOSIS — E1165 Type 2 diabetes mellitus with hyperglycemia: Secondary | ICD-10-CM | POA: Diagnosis not present

## 2015-07-29 DIAGNOSIS — I2510 Atherosclerotic heart disease of native coronary artery without angina pectoris: Secondary | ICD-10-CM | POA: Diagnosis not present

## 2015-07-29 DIAGNOSIS — I2699 Other pulmonary embolism without acute cor pulmonale: Secondary | ICD-10-CM

## 2015-07-29 DIAGNOSIS — IMO0002 Reserved for concepts with insufficient information to code with codable children: Secondary | ICD-10-CM

## 2015-07-29 DIAGNOSIS — Z01818 Encounter for other preprocedural examination: Secondary | ICD-10-CM

## 2015-07-29 DIAGNOSIS — E785 Hyperlipidemia, unspecified: Secondary | ICD-10-CM

## 2015-07-29 MED ORDER — ATORVASTATIN CALCIUM 80 MG PO TABS: 80.0000 mg | ORAL_TABLET | Freq: Every day | ORAL | Status: DC

## 2015-07-29 MED ORDER — ATORVASTATIN CALCIUM 40 MG PO TABS: 40.0000 mg | ORAL_TABLET | Freq: Every day | ORAL | Status: DC

## 2015-07-29 NOTE — Patient Instructions (Signed)
Your physician has requested that you have a lexiscan myoview. For further information please visit [email or web address] Please follow instruction sheet, as given.  Start atorvastatin 80 mg take at bedtime  Labs--hepatic today or tomorrow  Labs - 3 months do hepatic, lipid- will mail you lab slip.  Your physician wants you to follow-up in 12 months Dr Duke Salvia.  You will receive a reminder letter in the mail two months in advance. If you don't receive a letter, please call our office to schedule the follow-up appointment.

## 2015-07-29 NOTE — Telephone Encounter (Signed)
Discontinue 80 mg of atorvastatin Due to patient taking diflucan on a weekly Change to 40 mg of atorvastatin qhs Will notify patient when he comes to office 07/31/15

## 2015-07-31 ENCOUNTER — Telehealth: Payer: Self-pay | Admitting: Cardiovascular Disease

## 2015-07-31 LAB — HEPATIC FUNCTION PANEL
ALT: 17 U/L (ref 9–46)
AST: 15 U/L (ref 10–40)
Albumin: 4.3 g/dL (ref 3.6–5.1)
Alkaline Phosphatase: 45 U/L (ref 40–115)
Bilirubin, Direct: 0.1 mg/dL (ref <=0.2)
Indirect Bilirubin: 0.3 mg/dL (ref 0.2–1.2)
Total Bilirubin: 0.4 mg/dL (ref 0.2–1.2)
Total Protein: 6.8 g/dL (ref 6.1–8.1)

## 2015-07-31 NOTE — Telephone Encounter (Signed)
Fleet Contras is calling in wanting to clarify the strength on a medication sent over for a pt. She says that 2 prescriptions were sent over of Lipitor 40 mg and 80 mg. She would like to know which strength is correct. Please f/u with her   thanks

## 2015-07-31 NOTE — Telephone Encounter (Signed)
Spoke to caller, noted Dr. Duke Salvia recommended  daily of atorvastatin - pharmacy tech voiced acknowledgment of info.

## 2015-08-01 ENCOUNTER — Telehealth (HOSPITAL_COMMUNITY): Payer: Self-pay

## 2015-08-01 ENCOUNTER — Telehealth: Payer: Self-pay | Admitting: *Deleted

## 2015-08-01 NOTE — Telephone Encounter (Signed)
-----   Message from Chilton Si, MD sent at 08/01/2015  5:37 PM EDT ----- Liver function is normal.

## 2015-08-01 NOTE — Telephone Encounter (Signed)
Encounter complete. 

## 2015-08-01 NOTE — Telephone Encounter (Signed)
Spoke to patient. Result given . Verbalized understanding  

## 2015-08-04 ENCOUNTER — Emergency Department (HOSPITAL_COMMUNITY): Payer: Medicare Other

## 2015-08-04 ENCOUNTER — Emergency Department (HOSPITAL_COMMUNITY)
Admission: EM | Admit: 2015-08-04 | Discharge: 2015-08-04 | Disposition: A | Discharge status: Home / Self Care | Payer: Medicare Other | Attending: Emergency Medicine | Admitting: Emergency Medicine

## 2015-08-04 ENCOUNTER — Encounter (HOSPITAL_COMMUNITY): Payer: Self-pay | Admitting: Emergency Medicine

## 2015-08-04 DIAGNOSIS — W01198A Fall on same level from slipping, tripping and stumbling with subsequent striking against other object, initial encounter: Secondary | ICD-10-CM | POA: Insufficient documentation

## 2015-08-04 DIAGNOSIS — E119 Type 2 diabetes mellitus without complications: Secondary | ICD-10-CM | POA: Diagnosis not present

## 2015-08-04 DIAGNOSIS — S8002XA Contusion of left knee, initial encounter: Secondary | ICD-10-CM | POA: Insufficient documentation

## 2015-08-04 DIAGNOSIS — Y9289 Other specified places as the place of occurrence of the external cause: Secondary | ICD-10-CM | POA: Insufficient documentation

## 2015-08-04 DIAGNOSIS — Y9389 Activity, other specified: Secondary | ICD-10-CM | POA: Diagnosis not present

## 2015-08-04 DIAGNOSIS — Y998 Other external cause status: Secondary | ICD-10-CM | POA: Diagnosis not present

## 2015-08-04 DIAGNOSIS — Z7982 Long term (current) use of aspirin: Secondary | ICD-10-CM | POA: Diagnosis not present

## 2015-08-04 DIAGNOSIS — S8992XA Unspecified injury of left lower leg, initial encounter: Secondary | ICD-10-CM | POA: Diagnosis present

## 2015-08-04 DIAGNOSIS — G8929 Other chronic pain: Secondary | ICD-10-CM | POA: Insufficient documentation

## 2015-08-04 DIAGNOSIS — Z794 Long term (current) use of insulin: Secondary | ICD-10-CM | POA: Diagnosis not present

## 2015-08-04 DIAGNOSIS — R42 Dizziness and giddiness: Secondary | ICD-10-CM | POA: Insufficient documentation

## 2015-08-04 DIAGNOSIS — Z79899 Other long term (current) drug therapy: Secondary | ICD-10-CM | POA: Insufficient documentation

## 2015-08-04 DIAGNOSIS — I10 Essential (primary) hypertension: Secondary | ICD-10-CM | POA: Insufficient documentation

## 2015-08-04 DIAGNOSIS — R000 Tachycardia, unspecified: Secondary | ICD-10-CM | POA: Insufficient documentation

## 2015-08-04 DIAGNOSIS — E785 Hyperlipidemia, unspecified: Secondary | ICD-10-CM | POA: Diagnosis not present

## 2015-08-04 DIAGNOSIS — Z720 Tobacco use: Secondary | ICD-10-CM | POA: Diagnosis not present

## 2015-08-04 DIAGNOSIS — I2510 Atherosclerotic heart disease of native coronary artery without angina pectoris: Secondary | ICD-10-CM | POA: Diagnosis not present

## 2015-08-04 MED ORDER — OXYCODONE-ACETAMINOPHEN 5-325 MG PO TABS: 2.0000 | ORAL_TABLET | ORAL | Status: DC | PRN

## 2015-08-04 NOTE — Discharge Instructions (Signed)
Contusion °A contusion is a deep bruise. Contusions happen when an injury causes bleeding under the skin. Signs of bruising include pain, puffiness (swelling), and discolored skin. The contusion may turn blue, purple, or yellow. °HOME CARE  °· Put ice on the injured area. °¨ Put ice in a plastic bag. °¨ Place a towel between your skin and the bag. °¨ Leave the ice on for 15-20 minutes, 03-04 times a day. °· Only take medicine as told by your doctor. °· Rest the injured area. °· If possible, raise (elevate) the injured area to lessen puffiness. °GET HELP RIGHT AWAY IF:  °· You have more bruising or puffiness. °· You have pain that is getting worse. °· Your puffiness or pain is not helped by medicine. °MAKE SURE YOU:  °· Understand these instructions. °· Will watch your condition. °· Will get help right away if you are not doing well or get worse. °Document Released: 05/24/2008 Document Revised: 02/28/2012 Document Reviewed: 10/11/2011 °ExitCare® Patient Information ©2015 [employer]. This information is not intended to replace advice given to you by your health care provider. Make sure you discuss any questions you have with your health care provider. ° °

## 2015-08-04 NOTE — ED Provider Notes (Signed)
CSN: [id number]     Arrival date & time 08/04/15  2137 History   First MD Initiated Contact with Patient 08/04/15 2200     Chief Complaint  Patient presents with  . Knee Pain     (Consider location/radiation/quality/duration/timing/severity/associated sxs/prior Treatment) Patient is a 44 y.o. male presenting with knee pain. The history is provided by the patient.  Knee Pain Location:  Knee Time since incident:  7 hours Injury: yes   Mechanism of injury: fall   Fall:    Impact surface:  Hard floor   Point of impact:  Knees   Entrapped after fall: no   Knee location:  L knee  Joseph [name] is a 44 y.o. male who presents to the ED with left knee pain that started this afternoon. Patient reports that he has a problem with vertigo and had not had his afternoon dose of Antivert. She fell on a clothes basket that was full of clothes and hit his knee. He has continued to have pain. He usually take oxycodone 15 mg IR  for his pain but ran out of it and can not get it for 2 days.   Past Medical History  Diagnosis Date  . CAD (coronary artery disease)   . Hypertension   . Hyperlipidemia   . Diabetes mellitus    Past Surgical History  Procedure Laterality Date  . Knee arthoplasty    . Carpel tunnel    . Shoulder surgery     Family History  Problem Relation Age of Onset  . Hypertension Brother   . Hypertension Father    Social History  Substance Use Topics  . Smoking status: Current Every Day Smoker -- 0.50 packs/day    Types: Cigarettes  . Smokeless tobacco: None  . Alcohol Use: No    Review of Systems Negative except as stated in HPI   Allergies  Sulfa antibiotics  Home Medications   Prior to Admission medications   Medication Sig Start Date End Date Taking? Authorizing Provider  albuterol (PROAIR HFA) 108 (90 BASE) MCG/ACT inhaler Inhale 2 puffs into the lungs every 6 (six) hours as needed for wheezing or shortness of breath.    Yes Historical Provider, MD    ALPRAZolam Prudy Feeler) 1 MG tablet Take 1 mg by mouth 3 (three) times daily.    Yes Historical Provider, MD  amLODipine (NORVASC) 2.5 MG tablet Take 2.5 mg by mouth every morning.    Yes Historical Provider, MD  aspirin 81 MG EC tablet Take 1 tablet (81 mg total) by mouth daily. Swallow whole. 04/27/13  Yes Lesle Chris Black, NP  atorvastatin (LIPITOR) 40 MG tablet Take 1 tablet (40 mg total) by mouth daily. 07/29/15  Yes Chilton Si, MD  buPROPion (WELLBUTRIN XL) 150 MG 24 hr tablet Take 150 mg by mouth 2 (two) times daily.     Yes Historical Provider, MD  diclofenac (VOLTAREN) 75 MG EC tablet Take 75 mg by mouth 2 (two) times daily.   Yes Historical Provider, MD  diphenhydrAMINE (BENADRYL) 25 mg capsule Take 25 mg by mouth every 6 (six) hours as needed for itching or allergies.   Yes Historical Provider, MD  fenofibrate (TRICOR) 145 MG tablet Take 145 mg by mouth every morning.    Yes Historical Provider, MD  fluconazole (DIFLUCAN) 100 MG tablet Take 100 mg by mouth every Monday.  03/12/15  Yes Historical Provider, MD  HUMALOG KWIKPEN 100 UNIT/ML SOPN Inject 35-40 Units into the skin 3 (three) times daily  with meals. As directed per sliding scale 05/02/13  Yes Historical Provider, MD  ibuprofen (ADVIL,MOTRIN) 200 MG tablet Take 800 mg by mouth 3 (three) times daily as needed for pain.    Yes Historical Provider, MD  LANTUS SOLOSTAR 100 UNIT/ML Solostar Pen Inject 55-70 Units into the skin 2 (two) times daily. 55 units in the morning and 70 units at bedtime 12/01/14  Yes Historical Provider, MD  lisinopril (PRINIVIL,ZESTRIL) 5 MG tablet Take 5 mg by mouth daily. 12/29/14  Yes Historical Provider, MD  LYRICA 50 MG capsule Take 50 mg by mouth 2 (two) times daily.  02/16/15  Yes Historical Provider, MD  meclizine (ANTIVERT) 25 MG tablet Take 25 mg by mouth 3 (three) times daily.  04/10/13  Yes Historical Provider, MD  metFORMIN (GLUCOPHAGE) 1000 MG tablet Take 1,000 mg by mouth 2 (two) times daily with a meal.      Yes Historical Provider, MD  nitroGLYCERIN (NITROSTAT) 0.4 MG SL tablet Place 0.4 mg under the tongue every 5 (five) minutes as needed for chest pain.   Yes Historical Provider, MD  omeprazole (PRILOSEC) 40 MG capsule Take 40 mg by mouth daily.  04/22/13  Yes Historical Provider, MD  oxyCODONE (ROXICODONE) 15 MG immediate release tablet Take 15 mg by mouth 5 (five) times daily.    Yes Historical Provider, MD  promethazine (PHENERGAN) 25 MG tablet Take 25 mg by mouth every 6 (six) hours as needed for nausea.    Yes Historical Provider, MD  Rivaroxaban (XARELTO) 20 MG TABS Take 1 tablet (20 mg total) by mouth daily. 05/28/13  Yes Lesle Chris Black, NP  tiZANidine (ZANAFLEX) 4 MG tablet Take 4 mg by mouth daily as needed for muscle spasms.  02/17/15  Yes Historical Provider, MD  VICTOZA 18 MG/3ML SOPN Inject 0.2 mLs (1.2 mg total) into the skin daily. Inject once daily at the same time 04/29/15  Yes Reather Littler, MD  glucose blood (ONETOUCH VERIO) test strip Use to test blood sugar 3 times daily as instructed. Dx: E10.9 04/29/15   Reather Littler, MD  Insulin Pen Needle 32G X 4 MM MISC Use to inject insulin 4 times daily. Dx: E10.9 04/29/15   Reather Littler, MD  oxyCODONE-acetaminophen (PERCOCET/ROXICET) 5-325 MG per tablet Take 2 tablets by mouth every 4 (four) hours as needed for severe pain. 08/04/15   Hope Orlene Och, NP   BP 112/82 mmHg  Pulse 108  Temp(Src) 98.5 F (36.9 C) (Oral)  Resp 16  Ht 6\' 5"  (1.956 m)  Wt 310 lb (140.615 kg)  BMI 36.75 kg/m2  SpO2 100% Physical Exam  Constitutional: He is oriented to person, place, and time. He appears well-developed and well-nourished. No distress.  HENT:  Head: Normocephalic.  Eyes: Conjunctivae and EOM are normal.  Neck: Neck supple.  Cardiovascular: Tachycardia present.   Pulmonary/Chest: Effort normal.  Musculoskeletal:       Left knee: He exhibits no ecchymosis, no deformity, no laceration, no erythema and normal alignment. Swelling: minimal. Tenderness found.        Legs: Pedal pulses 2+, adequate circulation, good touch sensation. Tender with palpation of patella.   Neurological: He is alert and oriented to person, place, and time. He has normal strength. Gait normal.  Skin: Skin is warm and dry.  Psychiatric: He has a normal mood and affect. His behavior is normal.  Nursing note and vitals reviewed.   ED Course  Procedures (including critical care time) Labs Review Labs Reviewed - No data to  display  Imaging Review Dg Knee Complete 4 Views Left  08/04/2015   CLINICAL DATA:  Fall with left knee pain.  Initial encounter.  EXAM: LEFT KNEE - COMPLETE 4+ VIEW  COMPARISON:  Left knee MRI 08/05/2007  FINDINGS: There is no evidence of fracture, dislocation, or joint effusion.  Advanced tricompartmental osteoarthritis with diffuse marginal spurring and flattening of the femoral condyles.  Bone island in the distal femoral metaphysis is confirmed on comparison MRI  IMPRESSION: 1. No acute finding. 2. Advanced knee osteoarthritis.   Electronically Signed   By: Marnee Spring M.D.   On: 08/04/2015 22:28   I, NEESE,HOPE, personally reviewed and evaluated these images result as part of my medical decision-making.   MDM  44 y.o. male with hx of chronic pain and vertigo. Stable for d/c without neurovascular compromise and with steady gait. Ace wrap to left knee, ice, elevation. Will give patient pre pack of percocet to go home with and he is to call his PCP. in the morning for follow up. Discussed with the patient clinical and x-ray findings and plan of care and all questioned fully answered.   Final diagnoses:  Contusion of left knee, initial encounter     [facility], NP 08/05/15 0136  Nelva Nay, MD 08/07/15 [phone]

## 2015-08-04 NOTE — ED Notes (Signed)
Pt states that he fell today around 1500- Has dx of vertigo and he got dizzy and landed on his Lt knee- Has pain too his knee cap

## 2015-08-06 ENCOUNTER — Telehealth (HOSPITAL_COMMUNITY): Payer: Self-pay

## 2015-08-06 ENCOUNTER — Inpatient Hospital Stay (HOSPITAL_COMMUNITY): Admission: RE | Admit: 2015-08-06 | Payer: Medicare Other | Source: Ambulatory Visit

## 2015-08-06 NOTE — Telephone Encounter (Signed)
Patient given detailed instructions per Myocardial Perfusion Study Information Sheet for test on 08-11-2015 at 1130. Patient Notified to arrive 15 minutes early, and that it is imperative to arrive on time for appointment to keep from having the test rescheduled. Patient verbalized understanding. Randa Evens, Dory Demont A

## 2015-08-07 ENCOUNTER — Inpatient Hospital Stay (HOSPITAL_COMMUNITY): Admission: RE | Admit: 2015-08-07 | Payer: Medicare Other | Source: Ambulatory Visit

## 2015-08-07 ENCOUNTER — Encounter (HOSPITAL_COMMUNITY): Payer: Medicare Other

## 2015-08-11 ENCOUNTER — Ambulatory Visit (HOSPITAL_COMMUNITY): Payer: Medicare Other | Attending: Cardiovascular Disease

## 2015-08-11 DIAGNOSIS — I2510 Atherosclerotic heart disease of native coronary artery without angina pectoris: Secondary | ICD-10-CM | POA: Insufficient documentation

## 2015-08-11 DIAGNOSIS — I2699 Other pulmonary embolism without acute cor pulmonale: Secondary | ICD-10-CM | POA: Diagnosis not present

## 2015-08-11 DIAGNOSIS — R9439 Abnormal result of other cardiovascular function study: Secondary | ICD-10-CM | POA: Insufficient documentation

## 2015-08-11 DIAGNOSIS — IMO0002 Reserved for concepts with insufficient information to code with codable children: Secondary | ICD-10-CM

## 2015-08-11 DIAGNOSIS — I10 Essential (primary) hypertension: Secondary | ICD-10-CM | POA: Insufficient documentation

## 2015-08-11 DIAGNOSIS — Z01818 Encounter for other preprocedural examination: Secondary | ICD-10-CM | POA: Insufficient documentation

## 2015-08-11 DIAGNOSIS — E1165 Type 2 diabetes mellitus with hyperglycemia: Secondary | ICD-10-CM | POA: Insufficient documentation

## 2015-08-11 MED ORDER — REGADENOSON 0.4 MG/5ML IV SOLN
0.4000 mg | Freq: Once | INTRAVENOUS | Status: AC
  Administered 2015-08-11: 0.4 mg via INTRAVENOUS

## 2015-08-11 MED ORDER — TECHNETIUM TC 99M SESTAMIBI GENERIC - CARDIOLITE
32.3000 | Freq: Once | INTRAVENOUS | Status: AC | PRN
  Administered 2015-08-11: 32.3 via INTRAVENOUS

## 2015-08-12 ENCOUNTER — Ambulatory Visit (HOSPITAL_COMMUNITY): Payer: Medicare Other | Attending: Cardiology

## 2015-08-12 LAB — MYOCARDIAL PERFUSION IMAGING
LV dias vol: 108 mL
LV sys vol: 54 mL
Peak HR: 121 {beats}/min
RATE: 0.35
Rest HR: 111 {beats}/min
SDS: 1
SRS: 4
SSS: 5
TID: 0.91

## 2015-08-12 MED ORDER — TECHNETIUM TC 99M SESTAMIBI GENERIC - CARDIOLITE
33.0000 | Freq: Once | INTRAVENOUS | Status: AC | PRN
  Administered 2015-08-12: 33 via INTRAVENOUS

## 2015-08-14 ENCOUNTER — Telehealth: Payer: Self-pay | Admitting: *Deleted

## 2015-08-14 NOTE — Telephone Encounter (Signed)
-----   Message from Chilton Si, MD sent at 08/12/2015 11:58 PM EDT ----- Stress test mostly unchanged from prior.  [name] is at acceptable risk to undergo his orthopedic surgeries without additional testing or interventions.

## 2015-08-14 NOTE — Telephone Encounter (Signed)
Phone busy, will call back

## 2015-08-15 NOTE — Telephone Encounter (Signed)
LEFT MESSAGE ON VOICE MAIL CALL BACK WITH THE SURGEON'S NAME.

## 2015-08-18 NOTE — Telephone Encounter (Signed)
Spoke to patient. Result given . Verbalized understanding DR Charlann Boxer IS THE SURGEON 'S NAME

## 2015-08-20 MED FILL — Oxycodone w/ Acetaminophen Tab 5-325 MG: ORAL | Qty: 6 | Status: AC

## 2015-08-22 ENCOUNTER — Telehealth: Payer: Self-pay | Admitting: Cardiovascular Disease

## 2015-08-22 NOTE — Telephone Encounter (Signed)
SPOKE TO PATIENT  INFORMED HIM WILL RE -CONTACT DR Charlann Boxer OFFICE SEND TO SCHEDULER.

## 2015-08-22 NOTE — Telephone Encounter (Signed)
He wants to know if his clarence for surgery have been faxed to Dr Charlann Boxer?

## 2015-08-22 NOTE — Telephone Encounter (Signed)
Spoke to patient  He is aware  Dr Charlann Boxer office has information.

## 2015-09-05 ENCOUNTER — Ambulatory Visit: Payer: Medicare Other | Admitting: Internal Medicine

## 2015-10-08 ENCOUNTER — Telehealth: Payer: Self-pay

## 2015-10-08 NOTE — Telephone Encounter (Signed)
Patient has been cleared for left knee arthroscopy

## 2015-10-08 NOTE — Telephone Encounter (Signed)
[facility] Dr Nilsa Nuttinglin's office.

## 2015-10-09 ENCOUNTER — Telehealth: Payer: Self-pay | Admitting: *Deleted

## 2015-10-09 DIAGNOSIS — E785 Hyperlipidemia, unspecified: Secondary | ICD-10-CM

## 2015-10-09 DIAGNOSIS — Z01818 Encounter for other preprocedural examination: Secondary | ICD-10-CM

## 2015-10-09 DIAGNOSIS — IMO0002 Reserved for concepts with insufficient information to code with codable children: Secondary | ICD-10-CM

## 2015-10-09 DIAGNOSIS — E1165 Type 2 diabetes mellitus with hyperglycemia: Secondary | ICD-10-CM

## 2015-10-09 DIAGNOSIS — I10 Essential (primary) hypertension: Secondary | ICD-10-CM

## 2015-10-09 DIAGNOSIS — Z79899 Other long term (current) drug therapy: Secondary | ICD-10-CM

## 2015-10-09 DIAGNOSIS — I2699 Other pulmonary embolism without acute cor pulmonale: Secondary | ICD-10-CM

## 2015-10-09 NOTE — Telephone Encounter (Signed)
Open error 

## 2015-10-09 NOTE — Telephone Encounter (Signed)
-----   Message from Tobin ChadSharon Martin V, RN sent at 07/29/2015  5:44 PM EDT ----- Need labs in 3 months hepatic, lipids  Due nov 2016,mail in oct 2016

## 2015-10-09 NOTE — Telephone Encounter (Signed)
Mail letter and lab slip Lipids,hepatic

## 2015-10-12 ENCOUNTER — Other Ambulatory Visit: Payer: Self-pay | Admitting: Endocrinology

## 2016-07-02 ENCOUNTER — Emergency Department (HOSPITAL_COMMUNITY)
Admission: EM | Admit: 2016-07-02 | Discharge: 2016-07-02 | Disposition: A | Discharge status: Home / Self Care | Payer: Medicare Other | Attending: Emergency Medicine | Admitting: Emergency Medicine

## 2016-07-02 ENCOUNTER — Encounter (HOSPITAL_COMMUNITY): Payer: Self-pay | Admitting: *Deleted

## 2016-07-02 DIAGNOSIS — E785 Hyperlipidemia, unspecified: Secondary | ICD-10-CM | POA: Diagnosis not present

## 2016-07-02 DIAGNOSIS — F17210 Nicotine dependence, cigarettes, uncomplicated: Secondary | ICD-10-CM | POA: Diagnosis not present

## 2016-07-02 DIAGNOSIS — Z794 Long term (current) use of insulin: Secondary | ICD-10-CM | POA: Insufficient documentation

## 2016-07-02 DIAGNOSIS — Z7982 Long term (current) use of aspirin: Secondary | ICD-10-CM | POA: Insufficient documentation

## 2016-07-02 DIAGNOSIS — I2510 Atherosclerotic heart disease of native coronary artery without angina pectoris: Secondary | ICD-10-CM | POA: Insufficient documentation

## 2016-07-02 DIAGNOSIS — Z79899 Other long term (current) drug therapy: Secondary | ICD-10-CM | POA: Diagnosis not present

## 2016-07-02 DIAGNOSIS — E119 Type 2 diabetes mellitus without complications: Secondary | ICD-10-CM | POA: Diagnosis not present

## 2016-07-02 DIAGNOSIS — Z7984 Long term (current) use of oral hypoglycemic drugs: Secondary | ICD-10-CM | POA: Diagnosis not present

## 2016-07-02 DIAGNOSIS — I10 Essential (primary) hypertension: Secondary | ICD-10-CM | POA: Diagnosis not present

## 2016-07-02 DIAGNOSIS — K047 Periapical abscess without sinus: Secondary | ICD-10-CM | POA: Diagnosis not present

## 2016-07-02 DIAGNOSIS — R51 Headache: Secondary | ICD-10-CM | POA: Diagnosis present

## 2016-07-02 HISTORY — DX: Anxiety disorder, unspecified: F41.9

## 2016-07-02 MED ORDER — CLINDAMYCIN HCL 150 MG PO CAPS
300.0000 mg | ORAL_CAPSULE | Freq: Once | ORAL | Status: AC
  Administered 2016-07-02: 300 mg via ORAL
  Filled 2016-07-02: qty 2

## 2016-07-02 MED ORDER — OXYCODONE-ACETAMINOPHEN 5-325 MG PO TABS: 1.0000 | ORAL_TABLET | ORAL | Status: DC | PRN

## 2016-07-02 MED ORDER — OXYCODONE-ACETAMINOPHEN 5-325 MG PO TABS
1.0000 | ORAL_TABLET | Freq: Once | ORAL | Status: AC
  Administered 2016-07-02: 1 via ORAL
  Filled 2016-07-02: qty 1

## 2016-07-02 MED ORDER — CLINDAMYCIN HCL 300 MG PO CAPS: 300.0000 mg | ORAL_CAPSULE | Freq: Four times a day (QID) | ORAL | Status: DC

## 2016-07-02 NOTE — Discharge Instructions (Signed)
Dental Abscess A dental abscess is pus in or around a tooth. HOME CARE  Take medicines only as told by your dentist.  If you were prescribed antibiotic medicine, finish all of it even if you start to feel better.  Rinse your mouth (gargle) often with salt water.  Do not drive or use heavy machinery, like a lawn mower, while taking pain medicine.  Do not apply heat to the outside of your mouth.  Keep all follow-up visits as told by your dentist. This is important. GET HELP IF:  Your pain is worse, and medicine does not help. GET HELP RIGHT AWAY IF:  You have a fever or chills.  Your symptoms suddenly get worse.  You have a very bad headache.  You have problems breathing or swallowing.  You have trouble opening your mouth.  You have puffiness (swelling) in your neck or around your eye.   This information is not intended to replace advice given to you by your health care provider. Make sure you discuss any questions you have with your health care provider.   Document Released: 04/22/2015 Document Reviewed: 04/22/2015 Elsevier Interactive Patient Education 2016 [employer]  

## 2016-07-02 NOTE — ED Provider Notes (Signed)
CSN: [id number]     Arrival date & time 07/02/16  1938 History   First MD Initiated Contact with Patient 07/02/16 1951     Chief Complaint  Patient presents with  . Otalgia     (Consider location/radiation/quality/duration/timing/severity/associated sxs/prior Treatment) HPI   Joseph [name] is a 45 y.o. male who presents to the Emergency Department complaining of right sided facial pain and swelling for several days.  He describes a sharp pain radiating in his right face toward his ear. Pain is worse with chewing. He reports history of multiple dental caries and poor dental hygiene. He states his swelling and pain are worse along his chin.  He denies fever, neck pain, difficulty swallowing, and vomiting. He has raised his mouth with peroxide without relief.   Past Medical History  Diagnosis Date  . CAD (coronary artery disease)   . Hypertension   . Hyperlipidemia   . Diabetes mellitus   . Anxiety    Past Surgical History  Procedure Laterality Date  . Knee arthoplasty    . Carpel tunnel    . Shoulder surgery    . Shoulder surgery     Family History  Problem Relation Age of Onset  . Hypertension Brother   . Hypertension Father    Social History  Substance Use Topics  . Smoking status: Current Every Day Smoker -- 0.50 packs/day    Types: Cigarettes  . Smokeless tobacco: None  . Alcohol Use: No    Review of Systems  Constitutional: Negative for fever and appetite change.  HENT: Positive for dental problem. Negative for congestion, facial swelling, sore throat and trouble swallowing.   Eyes: Negative for pain and visual disturbance.  Musculoskeletal: Negative for neck pain and neck stiffness.  Neurological: Negative for dizziness, facial asymmetry and headaches.  Hematological: Negative for adenopathy.  All other systems reviewed and are negative.     Allergies  Sulfa antibiotics  Home Medications   Prior to Admission medications   Medication Sig Start Date  End Date Taking? Authorizing Provider  albuterol (PROAIR HFA) 108 (90 BASE) MCG/ACT inhaler Inhale 2 puffs into the lungs every 6 (six) hours as needed for wheezing or shortness of breath.     Historical Provider, MD  ALPRAZolam Prudy Feeler) 1 MG tablet Take 1 mg by mouth 3 (three) times daily.     Historical Provider, MD  amLODipine (NORVASC) 2.5 MG tablet Take 2.5 mg by mouth every morning.     Historical Provider, MD  aspirin 81 MG EC tablet Take 1 tablet (81 mg total) by mouth daily. Swallow whole. 04/27/13   Gwenyth Bender, NP  atorvastatin (LIPITOR) 40 MG tablet Take 1 tablet (40 mg total) by mouth daily. 07/29/15   Chilton Si, MD  buPROPion (WELLBUTRIN XL) 150 MG 24 hr tablet Take 150 mg by mouth 2 (two) times daily.      Historical Provider, MD  diclofenac (VOLTAREN) 75 MG EC tablet Take 75 mg by mouth 2 (two) times daily.    Historical Provider, MD  diphenhydrAMINE (BENADRYL) 25 mg capsule Take 25 mg by mouth every 6 (six) hours as needed for itching or allergies.    Historical Provider, MD  fenofibrate (TRICOR) 145 MG tablet Take 145 mg by mouth every morning.     Historical Provider, MD  fluconazole (DIFLUCAN) 100 MG tablet Take 100 mg by mouth every Monday.  03/12/15   Historical Provider, MD  glucose blood (ONETOUCH VERIO) test strip Use to test blood sugar 3  times daily as instructed. Dx: E10.9 04/29/15   Reather LittlerAjay Kumar, MD  HUMALOG KWIKPEN 100 UNIT/ML SOPN Inject 35-40 Units into the skin 3 (three) times daily with meals. As directed per sliding scale 05/02/13   Historical Provider, MD  ibuprofen (ADVIL,MOTRIN) 200 MG tablet Take 800 mg by mouth 3 (three) times daily as needed for pain.     Historical Provider, MD  Insulin Pen Needle 32G X 4 MM MISC Use to inject insulin 4 times daily. Dx: E10.9 04/29/15   Reather LittlerAjay Kumar, MD  LANTUS SOLOSTAR 100 UNIT/ML Solostar Pen Inject 55-70 Units into the skin 2 (two) times daily. 55 units in the morning and 70 units at bedtime 12/01/14   Historical Provider, MD   lisinopril (PRINIVIL,ZESTRIL) 5 MG tablet Take 5 mg by mouth daily. 12/29/14   Historical Provider, MD  LYRICA 50 MG capsule Take 50 mg by mouth 2 (two) times daily.  02/16/15   Historical Provider, MD  meclizine (ANTIVERT) 25 MG tablet Take 25 mg by mouth 3 (three) times daily.  04/10/13   Historical Provider, MD  metFORMIN (GLUCOPHAGE) 1000 MG tablet Take 1,000 mg by mouth 2 (two) times daily with a meal.      Historical Provider, MD  nitroGLYCERIN (NITROSTAT) 0.4 MG SL tablet Place 0.4 mg under the tongue every 5 (five) minutes as needed for chest pain.    Historical Provider, MD  omeprazole (PRILOSEC) 40 MG capsule Take 40 mg by mouth daily.  04/22/13   Historical Provider, MD  oxyCODONE (ROXICODONE) 15 MG immediate release tablet Take 15 mg by mouth 5 (five) times daily.     Historical Provider, MD  oxyCODONE-acetaminophen (PERCOCET/ROXICET) 5-325 MG per tablet Take 2 tablets by mouth every 4 (four) hours as needed for severe pain. 08/04/15   Hope Orlene OchM Neese, NP  promethazine (PHENERGAN) 25 MG tablet Take 25 mg by mouth every 6 (six) hours as needed for nausea.     Historical Provider, MD  Rivaroxaban (XARELTO) 20 MG TABS Take 1 tablet (20 mg total) by mouth daily. 05/28/13   Gwenyth BenderKaren M Black, NP  tiZANidine (ZANAFLEX) 4 MG tablet Take 4 mg by mouth daily as needed for muscle spasms.  02/17/15   Historical Provider, MD  VICTOZA 18 MG/3ML SOPN Inject 0.2 mLs (1.2 mg total) into the skin daily. Inject once daily at the same time 04/29/15   Reather LittlerAjay Kumar, MD   BP 122/83 mmHg  Pulse 106  Temp(Src) 98.6 F (37 C) (Oral)  Resp 20  Ht 6\' 5"  (1.956 m)  Wt 145.151 kg  BMI 37.94 kg/m2  SpO2 100% Physical Exam  Constitutional: He is oriented to person, place, and time. He appears well-developed and well-nourished. No distress.  HENT:  Head: Normocephalic and atraumatic.  Right Ear: Tympanic membrane and ear canal normal.  Left Ear: Tympanic membrane and ear canal normal.  Mouth/Throat: Uvula is midline,  oropharynx is clear and moist and mucous membranes are normal. No trismus in the jaw. Dental caries present. No dental abscesses or uvula swelling.  Multiple Dental caries and tenderness of the right lower first premolar, tenderness and erythema of the adjacent gingiva without obvious abscess. Mild to moderate right lower facial swelling, no trismus, or sublingual abnml.    Neck: Normal range of motion. Neck supple.  Cardiovascular: Normal rate, regular rhythm and normal heart sounds.   No murmur heard. Pulmonary/Chest: Effort normal and breath sounds normal.  Musculoskeletal: Normal range of motion.  Lymphadenopathy:    He has no  cervical adenopathy.  Neurological: He is alert and oriented to person, place, and time. He exhibits normal muscle tone. Coordination normal.  Skin: Skin is warm and dry.  Nursing note and vitals reviewed.   ED Course  Procedures (including critical care time) Labs Review Labs Reviewed - No data to display  Imaging Review No results found. I have personally reviewed and evaluated these images and lab results as part of my medical decision-making.   EKG Interpretation None      MDM   Final diagnoses:  Dental abscess   Patient is well-appearing. Vital signs are stable. No concerning symptoms for Ludwig's angina. Likely dental abscess. Will start clindamycin and pain medication. Patient given referral information for local dentistry agrees to arrange a follow-up appointment.     Pauline Aus, PA-C 07/03/16 1327  Donnetta Hutching, MD 07/03/16 1816

## 2016-07-02 NOTE — ED Notes (Signed)
Pt c/o right ear pain and some swelling to the right side of his face. Pt reports having several bad teeth.

## 2016-07-28 ENCOUNTER — Encounter (HOSPITAL_COMMUNITY): Payer: Self-pay

## 2016-07-28 ENCOUNTER — Emergency Department (HOSPITAL_COMMUNITY): Payer: Medicare Other

## 2016-07-28 ENCOUNTER — Inpatient Hospital Stay (HOSPITAL_COMMUNITY)
Admission: EM | Admit: 2016-07-28 | Discharge: 2016-07-30 | DRG: 249 | Disposition: A | Discharge status: Home / Self Care | Payer: Medicare Other | Attending: Cardiovascular Disease | Admitting: Cardiovascular Disease

## 2016-07-28 DIAGNOSIS — Z79899 Other long term (current) drug therapy: Secondary | ICD-10-CM

## 2016-07-28 DIAGNOSIS — Z7901 Long term (current) use of anticoagulants: Secondary | ICD-10-CM

## 2016-07-28 DIAGNOSIS — F17210 Nicotine dependence, cigarettes, uncomplicated: Secondary | ICD-10-CM | POA: Diagnosis present

## 2016-07-28 DIAGNOSIS — Z79891 Long term (current) use of opiate analgesic: Secondary | ICD-10-CM

## 2016-07-28 DIAGNOSIS — E1065 Type 1 diabetes mellitus with hyperglycemia: Secondary | ICD-10-CM | POA: Diagnosis present

## 2016-07-28 DIAGNOSIS — R079 Chest pain, unspecified: Secondary | ICD-10-CM

## 2016-07-28 DIAGNOSIS — Z86711 Personal history of pulmonary embolism: Secondary | ICD-10-CM

## 2016-07-28 DIAGNOSIS — Z23 Encounter for immunization: Secondary | ICD-10-CM

## 2016-07-28 DIAGNOSIS — E785 Hyperlipidemia, unspecified: Secondary | ICD-10-CM | POA: Diagnosis present

## 2016-07-28 DIAGNOSIS — Z794 Long term (current) use of insulin: Secondary | ICD-10-CM

## 2016-07-28 DIAGNOSIS — I252 Old myocardial infarction: Secondary | ICD-10-CM

## 2016-07-28 DIAGNOSIS — Z6839 Body mass index (BMI) 39.0-39.9, adult: Secondary | ICD-10-CM

## 2016-07-28 DIAGNOSIS — I10 Essential (primary) hypertension: Secondary | ICD-10-CM | POA: Diagnosis present

## 2016-07-28 DIAGNOSIS — R739 Hyperglycemia, unspecified: Secondary | ICD-10-CM

## 2016-07-28 DIAGNOSIS — I25110 Atherosclerotic heart disease of native coronary artery with unstable angina pectoris: Principal | ICD-10-CM | POA: Diagnosis present

## 2016-07-28 DIAGNOSIS — G4733 Obstructive sleep apnea (adult) (pediatric): Secondary | ICD-10-CM | POA: Diagnosis present

## 2016-07-28 DIAGNOSIS — Z791 Long term (current) use of non-steroidal anti-inflammatories (NSAID): Secondary | ICD-10-CM

## 2016-07-28 DIAGNOSIS — Z7951 Long term (current) use of inhaled steroids: Secondary | ICD-10-CM

## 2016-07-28 DIAGNOSIS — E669 Obesity, unspecified: Secondary | ICD-10-CM | POA: Diagnosis present

## 2016-07-28 HISTORY — DX: Pneumonia, unspecified organism: J18.9

## 2016-07-28 HISTORY — DX: Depression, unspecified: F32.A

## 2016-07-28 HISTORY — DX: Acute myocardial infarction, unspecified: I21.9

## 2016-07-28 HISTORY — DX: Unspecified osteoarthritis, unspecified site: M19.90

## 2016-07-28 HISTORY — DX: Unspecified asthma, uncomplicated: J45.909

## 2016-07-28 HISTORY — DX: Type 2 diabetes mellitus without complications: E11.9

## 2016-07-28 HISTORY — DX: Other pulmonary embolism without acute cor pulmonale: I26.99

## 2016-07-28 HISTORY — DX: Major depressive disorder, single episode, unspecified: F32.9

## 2016-07-28 HISTORY — DX: Chronic obstructive pulmonary disease, unspecified: J44.9

## 2016-07-28 HISTORY — DX: Gastro-esophageal reflux disease without esophagitis: K21.9

## 2016-07-28 HISTORY — DX: Unspecified chronic bronchitis: J42

## 2016-07-28 HISTORY — DX: Headache: R51

## 2016-07-28 LAB — BASIC METABOLIC PANEL
Anion gap: 11 (ref 5–15)
BUN: 17 mg/dL (ref 6–20)
CO2: 20 mmol/L — ABNORMAL LOW (ref 22–32)
Calcium: 10 mg/dL (ref 8.9–10.3)
Chloride: 106 mmol/L (ref 101–111)
Creatinine, Ser: 1.17 mg/dL (ref 0.61–1.24)
GFR calc Af Amer: >60 mL/min (ref >60)
GFR calc non Af Amer: >60 mL/min (ref >60)
Glucose, Bld: 376 mg/dL — ABNORMAL HIGH (ref 65–99)
Potassium: 4.9 mmol/L (ref 3.5–5.1)
Sodium: 137 mmol/L (ref 135–145)

## 2016-07-28 LAB — CBC
HCT: 47.2 % (ref 39.0–52.0)
Hemoglobin: 15.8 g/dL (ref 13.0–17.0)
MCH: 29.3 pg (ref 26.0–34.0)
MCHC: 33.5 g/dL (ref 30.0–36.0)
MCV: 87.6 fL (ref 78.0–100.0)
Platelets: 280 10*3/uL (ref 150–400)
RBC: 5.39 MIL/uL (ref 4.22–5.81)
RDW: 12.7 % (ref 11.5–15.5)
WBC: 12.3 10*3/uL — ABNORMAL HIGH (ref 4.0–10.5)

## 2016-07-28 LAB — I-STAT TROPONIN, ED: Troponin i, poc: 0 ng/mL (ref 0.00–0.08)

## 2016-07-28 MED ORDER — FENTANYL CITRATE (PF) 100 MCG/2ML IJ SOLN
50.0000 ug | Freq: Once | INTRAMUSCULAR | Status: AC
  Administered 2016-07-28: 50 ug via INTRAVENOUS
  Filled 2016-07-28: qty 2

## 2016-07-28 NOTE — ED Triage Notes (Signed)
Per pT, Pt is coming from MD office after going to left sided chest pain that radiates to the left shoulder. Reports having some nausea with the pain. MD found EKG changes with assessment.

## 2016-07-28 NOTE — ED Notes (Signed)
Dr. Wickline at bedside at this time.  

## 2016-07-28 NOTE — ED Notes (Signed)
The pt has had lt sided chest pain for 3 weeks   He just saw his doctor and he was told to come here alert skin warm and dry

## 2016-07-28 NOTE — ED Provider Notes (Signed)
[facility]-EMERGENCY DEPT Provider Note   CSN: [id number] Arrival date & time: 07/28/16  1706  By signing my name below, I, Phillis Haggis, attest that this documentation has been prepared under the direction and in the presence of Zadie Rhine, MD. Electronically Signed: Phillis Haggis, ED Scribe. 07/28/16. 11:29 PM.  First Provider Contact:  First MD Initiated Contact with Patient 07/28/16 2315     History   Chief Complaint Chief Complaint  Patient presents with  . Chest Pain    The history is provided by the patient. No language interpreter was used.  Chest Pain   This is a new problem. The current episode started more than 1 week ago. The problem occurs constantly. The problem has been gradually worsening. The pain is associated with exertion. Pain location: left chest. The pain is moderate. The quality of the pain is described as pressure-like. The pain radiates to the left shoulder. The symptoms are aggravated by exertion and deep breathing. Associated symptoms include cough, nausea, shortness of breath and vomiting. Pertinent negatives include no abdominal pain and no hemoptysis. He has tried rest for the symptoms. The treatment provided no relief. Risk factors include male gender and obesity.  His past medical history is significant for CAD, diabetes, hyperlipidemia, hypertension and PE.  Procedure history is positive for cardiac catheterization.  HPI Comments: RISHARD DELANGE is a 45 y.o. Male with a hx of CAD, DM, HTN, PE, OSA, and coronary atherosclerosis who presents to the Emergency Department complaining of gradually worsening, constant, left sided chest pain that radiates to the left shoulder onset 3 weeks ago. He states, "It feels like an elephant is sitting on my chest." Pt reports associated fatigue, cough, wheezing, diarrhea, vomiting, nausea, and SOB. He reports worsening pain with any type of physical exertion and intermittently with deep breathing. He currently states that  his pain has somewhat subsided, but is still present. Pt is on Xarelto. Family states that he has missed doses of his medication, but took today's dose. He has not taken aspirin today. He was seen by his PCP today and sent to the ED following EKG changes with his assessment. Pt states that the pain feels more heart related rather than his PE pain. He denies abdominal pain, new hemoptysis, or new leg swelling.   Past Medical History:  Diagnosis Date  . Anxiety   . CAD (coronary artery disease)   . Diabetes mellitus   . Hyperlipidemia   . Hypertension   . PE (pulmonary embolism)     Patient Active Problem List   Diagnosis Date Noted  . Type II diabetes mellitus, uncontrolled (HCC) 04/29/2015  . OSA (obstructive sleep apnea) 05/08/2013  . Acute pulmonary embolism (HCC) 04/27/2013  . CHEST PAIN-UNSPECIFIED 11/23/2010  . Coronary atherosclerosis 09/10/2008  . DIABETES MELLITUS, TYPE I 09/09/2008  . HYPERLIPIDEMIA 09/09/2008  . Essential hypertension 09/09/2008    Past Surgical History:  Procedure Laterality Date  . carpel tunnel    . knee arthoplasty    . SHOULDER SURGERY    . SHOULDER SURGERY     Home Medications    Prior to Admission medications   Medication Sig Start Date End Date Taking? Authorizing Provider  albuterol (PROAIR HFA) 108 (90 BASE) MCG/ACT inhaler Inhale 2 puffs into the lungs every 6 (six) hours as needed for wheezing or shortness of breath.    Yes Historical Provider, MD  albuterol (PROVENTIL) (2.5 MG/3ML) 0.083% nebulizer solution Take 2.5 mg by nebulization every 4 (four) hours as needed  for wheezing or shortness of breath.   Yes Historical Provider, MD  ALPRAZolam Prudy Feeler) 1 MG tablet Take 1-2 mg by mouth 4 (four) times daily. 2 mg every evening   Yes Historical Provider, MD  amLODipine (NORVASC) 2.5 MG tablet Take 2.5 mg by mouth every morning.    Yes Historical Provider, MD  atorvastatin (LIPITOR) 40 MG tablet Take 1 tablet (40 mg total) by mouth  daily. Patient taking differently: Take 40 mg by mouth daily at 6 PM.  07/29/15  Yes Chilton Si, MD  beclomethasone (QVAR) 80 MCG/ACT inhaler Inhale 2 puffs into the lungs 2 (two) times daily as needed (for wheezing or shortness of breath).   Yes Historical Provider, MD  buPROPion (WELLBUTRIN XL) 150 MG 24 hr tablet Take 150 mg by mouth 2 (two) times daily.     Yes Historical Provider, MD  diclofenac (VOLTAREN) 75 MG EC tablet Take 75 mg by mouth 2 (two) times daily.   Yes Historical Provider, MD  diphenhydrAMINE (BENADRYL) 25 mg capsule Take 25 mg by mouth every 6 (six) hours as needed for itching or allergies.   Yes Historical Provider, MD  fenofibrate (TRICOR) 145 MG tablet Take 145 mg by mouth every morning.    Yes Historical Provider, MD  fluconazole (DIFLUCAN) 100 MG tablet Take 100 mg by mouth every Monday.  03/12/15  Yes Historical Provider, MD  fluticasone (FLONASE) 50 MCG/ACT nasal spray Place 2 sprays into both nostrils daily as needed for allergies or rhinitis.   Yes Historical Provider, MD  glucose blood (ONETOUCH VERIO) test strip Use to test blood sugar 3 times daily as instructed. Dx: E10.9 04/29/15  Yes Reather Littler, MD  HUMALOG KWIKPEN 100 UNIT/ML SOPN Inject 35-40 Units into the skin 3 (three) times daily with meals. As directed per sliding scale 05/02/13  Yes Historical Provider, MD  ibuprofen (ADVIL,MOTRIN) 200 MG tablet Take 1,200-1,400 mg by mouth 3 (three) times daily as needed for pain.    Yes Historical Provider, MD  Insulin Pen Needle 32G X 4 MM MISC Use to inject insulin 4 times daily. Dx: E10.9 04/29/15  Yes Reather Littler, MD  LANTUS SOLOSTAR 100 UNIT/ML Solostar Pen Inject 70 Units into the skin 2 (two) times daily.  12/01/14  Yes Historical Provider, MD  lisinopril (PRINIVIL,ZESTRIL) 5 MG tablet Take 5 mg by mouth daily. 12/29/14  Yes Historical Provider, MD  LYRICA 50 MG capsule Take 50-100 mg by mouth See admin instructions. 100 mg every morning and 50 mg every evening  02/16/15  Yes Historical Provider, MD  meclizine (ANTIVERT) 25 MG tablet Take 25 mg by mouth 3 (three) times daily as needed for dizziness. 25 mg every morning and 25 mg every evening.  May take an additional 25 mg during the day 04/10/13  Yes Historical Provider, MD  metFORMIN (GLUCOPHAGE) 1000 MG tablet Take 1,000 mg by mouth 2 (two) times daily with a meal.     Yes Historical Provider, MD  nitroGLYCERIN (NITROSTAT) 0.4 MG SL tablet Place 0.4 mg under the tongue every 5 (five) minutes as needed for chest pain.   Yes Historical Provider, MD  omeprazole (PRILOSEC) 40 MG capsule Take 40 mg by mouth daily.  04/22/13  Yes Historical Provider, MD  oxyCODONE (ROXICODONE) 15 MG immediate release tablet Take 15 mg by mouth 5 (five) times daily.    Yes Historical Provider, MD  promethazine (PHENERGAN) 25 MG tablet Take 25 mg by mouth every 6 (six) hours as needed for nausea.  Yes Historical Provider, MD  Rivaroxaban (XARELTO) 20 MG TABS Take 1 tablet (20 mg total) by mouth daily. 05/28/13  Yes Lesle Chris Black, NP  tiZANidine (ZANAFLEX) 4 MG tablet Take 4 mg by mouth daily as needed for muscle spasms.  02/17/15  Yes Historical Provider, MD  VICTOZA 18 MG/3ML SOPN Inject 0.2 mLs (1.2 mg total) into the skin daily. Inject once daily at the same time 04/29/15  Yes Reather Littler, MD    Family History Family History  Problem Relation Age of Onset  . Hypertension Brother   . Hypertension Father     Social History Social History  Substance Use Topics  . Smoking status: Current Every Day Smoker    Packs/day: 0.30    Types: Cigarettes  . Smokeless tobacco: Former Neurosurgeon  . Alcohol use No     Allergies   Sulfa antibiotics   Review of Systems Review of Systems  Constitutional: Positive for fatigue.  Respiratory: Positive for cough, shortness of breath and wheezing. Negative for hemoptysis.   Cardiovascular: Positive for chest pain. Negative for leg swelling.  Gastrointestinal: Positive for diarrhea, nausea and  vomiting. Negative for abdominal pain.  All other systems reviewed and are negative.    Physical Exam Updated Vital Signs BP 107/74 (BP Location: Left Arm)   Pulse 97   Temp 98.8 F (37.1 C) (Oral)   Resp 18   Ht 6\' 5"  (1.956 m)   Wt (!) 332 lb (150.6 kg)   SpO2 96%   BMI 39.37 kg/m   Physical Exam CONSTITUTIONAL: Well developed/well nourished HEAD: Normocephalic/atraumatic EYES: EOMI/PERRL ENMT: Mucous membranes moist NECK: supple no meningeal signs SPINE/BACK:entire spine nontender CV: S1/S2 noted, no murmurs/rubs/gallops noted LUNGS: Lungs are clear to auscultation bilaterally, no apparent distress ABDOMEN: soft, nontender, no rebound or guarding, bowel sounds noted throughout abdomen GU:no cva tenderness NEURO: Pt is awake/alert/appropriate, moves all extremitiesx4.  No facial droop. EXTREMITIES: pulses normal/equal, full ROM SKIN: warm, color normal PSYCH: no abnormalities of mood noted, alert and oriented to situation  ED Treatments / Results  DIAGNOSTIC STUDIES: Oxygen Saturation is 96% on RA, normal by my interpretation.    COORDINATION OF CARE: 11:25 PM-Discussed treatment plan which includes labs, EKG, and x-ray with pt at bedside and pt agreed to plan.    Labs (all labs ordered are listed, but only abnormal results are displayed) Labs Reviewed  BASIC METABOLIC PANEL - Abnormal; Notable for the following:       Result Value   CO2 20 (*)    Glucose, Bld 376 (*)    All other components within normal limits  CBC - Abnormal; Notable for the following:    WBC 12.3 (*)    All other components within normal limits  I-STAT TROPOININ, ED    EKG  EKG Interpretation  Date/Time:  Wednesday July 28 2016 17:14:02 EDT Ventricular Rate:  124 PR Interval:  152 QRS Duration: 102 QT Interval:  308 QTC Calculation: 442 R Axis:   -58 Text Interpretation:  Sinus tachycardia Left anterior fascicular block Inferior infarct , age undetermined Anterolateral infarct  , age undetermined Abnormal ECG Confirmed by Bebe Shaggy  MD, Indi Willhite ([id number]) on 07/28/2016 11:23:06 PM       Radiology Dg Chest 2 View  Result Date: 07/28/2016 CLINICAL DATA:  Left-sided chest pain radiating to left shoulder. Shortness of breath for 3 weeks. EXAM: CHEST  2 VIEW COMPARISON:  08/15/2013 FINDINGS: The heart size and mediastinal contours are within normal limits. Both lungs are clear.  The visualized skeletal structures are unremarkable. IMPRESSION: No active cardiopulmonary disease. Electronically Signed   By: Myles RosenthalJohn  Stahl M.D.   On: 07/28/2016 17:43    Procedures Procedures (including critical care time)  Medications Ordered in ED Medications  fentaNYL (SUBLIMAZE) injection 50 mcg (not administered)   Initial Impression / Assessment and Plan / ED Course  I have reviewed the triage vital signs and the nursing notes.  Pertinent labs & imaging results that were available during my care of the patient were reviewed by me and considered in my medical decision making (see chart for details).  Clinical Course    Pt chronically ill with multiple CAD risk factors He reports previous h/o MI He has h/o PE but is on xarelto and reports this episode is more c/w ACS Initial troponin negative Will need admission and may eventually require cardiac cath Pt reports CP improving D/w dr gardner for admission   Final Clinical Impressions(s) / ED Diagnoses   Final diagnoses:  Chest pain, rule out acute myocardial infarction  Hyperglycemia   I personally performed the services described in this documentation, which was scribed in my presence. The recorded information has been reviewed and is accurate.      New Prescriptions New Prescriptions   No medications on file     Zadie Rhineonald Rosabelle Jupin, MD 07/29/16 [id number]

## 2016-07-29 ENCOUNTER — Other Ambulatory Visit (HOSPITAL_COMMUNITY): Payer: Medicare Other

## 2016-07-29 ENCOUNTER — Encounter (HOSPITAL_COMMUNITY): Payer: Self-pay | Admitting: General Practice

## 2016-07-29 ENCOUNTER — Encounter (HOSPITAL_COMMUNITY)
Admission: EM | Disposition: A | Discharge status: Home / Self Care | Payer: Self-pay | Source: Home / Self Care | Attending: Cardiovascular Disease

## 2016-07-29 DIAGNOSIS — E1065 Type 1 diabetes mellitus with hyperglycemia: Secondary | ICD-10-CM | POA: Diagnosis present

## 2016-07-29 DIAGNOSIS — E669 Obesity, unspecified: Secondary | ICD-10-CM | POA: Diagnosis present

## 2016-07-29 DIAGNOSIS — I25110 Atherosclerotic heart disease of native coronary artery with unstable angina pectoris: Principal | ICD-10-CM

## 2016-07-29 DIAGNOSIS — I252 Old myocardial infarction: Secondary | ICD-10-CM | POA: Diagnosis not present

## 2016-07-29 DIAGNOSIS — E785 Hyperlipidemia, unspecified: Secondary | ICD-10-CM | POA: Diagnosis present

## 2016-07-29 DIAGNOSIS — I10 Essential (primary) hypertension: Secondary | ICD-10-CM | POA: Diagnosis present

## 2016-07-29 DIAGNOSIS — Z791 Long term (current) use of non-steroidal anti-inflammatories (NSAID): Secondary | ICD-10-CM | POA: Diagnosis not present

## 2016-07-29 DIAGNOSIS — E1059 Type 1 diabetes mellitus with other circulatory complications: Secondary | ICD-10-CM

## 2016-07-29 DIAGNOSIS — G4733 Obstructive sleep apnea (adult) (pediatric): Secondary | ICD-10-CM | POA: Diagnosis present

## 2016-07-29 DIAGNOSIS — I200 Unstable angina: Secondary | ICD-10-CM | POA: Diagnosis not present

## 2016-07-29 DIAGNOSIS — Z23 Encounter for immunization: Secondary | ICD-10-CM | POA: Diagnosis not present

## 2016-07-29 DIAGNOSIS — Z79899 Other long term (current) drug therapy: Secondary | ICD-10-CM | POA: Diagnosis not present

## 2016-07-29 DIAGNOSIS — R079 Chest pain, unspecified: Secondary | ICD-10-CM | POA: Diagnosis present

## 2016-07-29 DIAGNOSIS — Z794 Long term (current) use of insulin: Secondary | ICD-10-CM | POA: Diagnosis not present

## 2016-07-29 DIAGNOSIS — R739 Hyperglycemia, unspecified: Secondary | ICD-10-CM

## 2016-07-29 DIAGNOSIS — Z86711 Personal history of pulmonary embolism: Secondary | ICD-10-CM | POA: Diagnosis not present

## 2016-07-29 DIAGNOSIS — I2510 Atherosclerotic heart disease of native coronary artery without angina pectoris: Secondary | ICD-10-CM | POA: Diagnosis not present

## 2016-07-29 DIAGNOSIS — Z79891 Long term (current) use of opiate analgesic: Secondary | ICD-10-CM | POA: Diagnosis not present

## 2016-07-29 DIAGNOSIS — Z7951 Long term (current) use of inhaled steroids: Secondary | ICD-10-CM | POA: Diagnosis not present

## 2016-07-29 DIAGNOSIS — Z6839 Body mass index (BMI) 39.0-39.9, adult: Secondary | ICD-10-CM | POA: Diagnosis not present

## 2016-07-29 DIAGNOSIS — Z955 Presence of coronary angioplasty implant and graft: Secondary | ICD-10-CM | POA: Diagnosis not present

## 2016-07-29 DIAGNOSIS — F17210 Nicotine dependence, cigarettes, uncomplicated: Secondary | ICD-10-CM | POA: Diagnosis present

## 2016-07-29 DIAGNOSIS — Z7901 Long term (current) use of anticoagulants: Secondary | ICD-10-CM | POA: Diagnosis not present

## 2016-07-29 HISTORY — PX: CARDIAC CATHETERIZATION: SHX172

## 2016-07-29 HISTORY — PX: CORONARY ANGIOPLASTY WITH STENT PLACEMENT: SHX49

## 2016-07-29 LAB — GLUCOSE, CAPILLARY
Glucose-Capillary: 300 mg/dL — ABNORMAL HIGH (ref 65–99)
Glucose-Capillary: 210 mg/dL — ABNORMAL HIGH (ref 65–99)
Glucose-Capillary: 227 mg/dL — ABNORMAL HIGH (ref 65–99)
Glucose-Capillary: 158 mg/dL — ABNORMAL HIGH (ref 65–99)
Glucose-Capillary: 271 mg/dL — ABNORMAL HIGH (ref 65–99)

## 2016-07-29 LAB — TROPONIN I
Troponin I: <0.03 ng/mL (ref <0.03)
Troponin I: <0.03 ng/mL (ref <0.03)
Troponin I: <0.03 ng/mL (ref <0.03)

## 2016-07-29 LAB — BASIC METABOLIC PANEL
Anion gap: 9 (ref 5–15)
BUN: 19 mg/dL (ref 6–20)
CO2: 25 mmol/L (ref 22–32)
Calcium: 9.1 mg/dL (ref 8.9–10.3)
Chloride: 105 mmol/L (ref 101–111)
Creatinine, Ser: 1.2 mg/dL (ref 0.61–1.24)
GFR calc Af Amer: >60 mL/min (ref >60)
GFR calc non Af Amer: >60 mL/min (ref >60)
Glucose, Bld: 171 mg/dL — ABNORMAL HIGH (ref 65–99)
Potassium: 4.6 mmol/L (ref 3.5–5.1)
Sodium: 139 mmol/L (ref 135–145)

## 2016-07-29 LAB — CBC
HCT: 43.6 % (ref 39.0–52.0)
Hemoglobin: 14.1 g/dL (ref 13.0–17.0)
MCH: 28.4 pg (ref 26.0–34.0)
MCHC: 32.3 g/dL (ref 30.0–36.0)
MCV: 87.9 fL (ref 78.0–100.0)
Platelets: 227 10*3/uL (ref 150–400)
RBC: 4.96 MIL/uL (ref 4.22–5.81)
RDW: 13 % (ref 11.5–15.5)
WBC: 11.9 10*3/uL — ABNORMAL HIGH (ref 4.0–10.5)

## 2016-07-29 LAB — PROTIME-INR
INR: 5.85
Prothrombin Time: 54.2 seconds — ABNORMAL HIGH (ref 11.4–15.2)

## 2016-07-29 LAB — POCT ACTIVATED CLOTTING TIME: Activated Clotting Time: 483 seconds

## 2016-07-29 LAB — MAGNESIUM: Magnesium: 1.5 mg/dL — ABNORMAL LOW (ref 1.7–2.4)

## 2016-07-29 SURGERY — LEFT HEART CATH AND CORONARY ANGIOGRAPHY

## 2016-07-29 MED ORDER — NITROGLYCERIN 1 MG/10 ML FOR IR/CATH LAB
INTRA_ARTERIAL | Status: AC
  Filled 2016-07-29: qty 10

## 2016-07-29 MED ORDER — CLOPIDOGREL BISULFATE 75 MG PO TABS
75.0000 mg | ORAL_TABLET | Freq: Every day | ORAL | Status: DC
  Administered 2016-07-30: 07:00:00 75 mg via ORAL
  Filled 2016-07-29: qty 1

## 2016-07-29 MED ORDER — FLUCONAZOLE 100 MG PO TABS: 100.0000 mg | ORAL_TABLET | ORAL | Status: DC

## 2016-07-29 MED ORDER — SODIUM CHLORIDE 0.9% FLUSH: 3.0000 mL | INTRAVENOUS | Status: DC | PRN

## 2016-07-29 MED ORDER — VERAPAMIL HCL 2.5 MG/ML IV SOLN
INTRA_ARTERIAL | Status: DC | PRN
  Administered 2016-07-29: 15 mL via INTRA_ARTERIAL

## 2016-07-29 MED ORDER — PROMETHAZINE HCL 25 MG PO TABS: 25.0000 mg | ORAL_TABLET | Freq: Four times a day (QID) | ORAL | Status: DC | PRN

## 2016-07-29 MED ORDER — IOPAMIDOL (ISOVUE-370) INJECTION 76%
INTRAVENOUS | Status: AC
  Filled 2016-07-29: qty 100

## 2016-07-29 MED ORDER — SODIUM CHLORIDE 0.9% FLUSH: 3.0000 mL | Freq: Two times a day (BID) | INTRAVENOUS | Status: DC

## 2016-07-29 MED ORDER — ONDANSETRON HCL 4 MG/2ML IJ SOLN: 4.0000 mg | Freq: Four times a day (QID) | INTRAMUSCULAR | Status: DC | PRN

## 2016-07-29 MED ORDER — CLOPIDOGREL BISULFATE 300 MG PO TABS
ORAL_TABLET | ORAL | Status: AC
  Filled 2016-07-29: qty 1

## 2016-07-29 MED ORDER — SODIUM CHLORIDE 0.9 % IV SOLN
INTRAVENOUS | Status: DC | PRN
  Administered 2016-07-29: 10 mL/h via INTRAVENOUS

## 2016-07-29 MED ORDER — SODIUM CHLORIDE 0.9 % IV SOLN: 250.0000 mL | INTRAVENOUS | Status: DC | PRN

## 2016-07-29 MED ORDER — ASPIRIN 81 MG PO CHEW: 81.0000 mg | CHEWABLE_TABLET | ORAL | Status: DC

## 2016-07-29 MED ORDER — ENSURE ENLIVE PO LIQD
237.0000 mL | Freq: Two times a day (BID) | ORAL | Status: DC
  Administered 2016-07-30: 237 mL via ORAL
  Filled 2016-07-29 (×4): qty 237

## 2016-07-29 MED ORDER — TIZANIDINE HCL 4 MG PO TABS
4.0000 mg | ORAL_TABLET | Freq: Every day | ORAL | Status: DC | PRN
  Filled 2016-07-29: qty 1

## 2016-07-29 MED ORDER — INSULIN ASPART 100 UNIT/ML ~~LOC~~ SOLN
0.0000 [IU] | SUBCUTANEOUS | Status: DC
Start: 2016-07-29 — End: 2016-07-29
  Administered 2016-07-29: 7 [IU] via SUBCUTANEOUS
  Administered 2016-07-29: 11 [IU] via SUBCUTANEOUS
  Administered 2016-07-29: 7 [IU] via SUBCUTANEOUS

## 2016-07-29 MED ORDER — RIVAROXABAN 20 MG PO TABS
20.0000 mg | ORAL_TABLET | Freq: Every day | ORAL | Status: DC
  Administered 2016-07-29: 18:00:00 20 mg via ORAL
  Filled 2016-07-29: qty 1

## 2016-07-29 MED ORDER — DOPAMINE-DEXTROSE 3.2-5 MG/ML-% IV SOLN
INTRAVENOUS | Status: AC
  Filled 2016-07-29: qty 250

## 2016-07-29 MED ORDER — INSULIN ASPART 100 UNIT/ML ~~LOC~~ SOLN
0.0000 [IU] | Freq: Three times a day (TID) | SUBCUTANEOUS | Status: DC
  Administered 2016-07-29: 20:00:00 8 [IU] via SUBCUTANEOUS
  Administered 2016-07-30: 11 [IU] via SUBCUTANEOUS
  Administered 2016-07-30: 07:00:00 8 [IU] via SUBCUTANEOUS

## 2016-07-29 MED ORDER — FENOFIBRATE 160 MG PO TABS
160.0000 mg | ORAL_TABLET | Freq: Every day | ORAL | Status: DC
  Administered 2016-07-29 – 2016-07-30 (×2): 160 mg via ORAL
  Filled 2016-07-29 (×2): qty 1

## 2016-07-29 MED ORDER — PANTOPRAZOLE SODIUM 40 MG PO TBEC
80.0000 mg | DELAYED_RELEASE_TABLET | Freq: Every day | ORAL | Status: DC
  Administered 2016-07-29 – 2016-07-30 (×2): 80 mg via ORAL
  Filled 2016-07-29 (×2): qty 2

## 2016-07-29 MED ORDER — VERAPAMIL HCL 2.5 MG/ML IV SOLN
INTRAVENOUS | Status: AC
  Filled 2016-07-29: qty 2

## 2016-07-29 MED ORDER — SODIUM CHLORIDE 0.9 % IV SOLN
INTRAVENOUS | Status: DC | PRN
  Administered 2016-07-29 (×2): 1.75 mg/kg/h via INTRAVENOUS

## 2016-07-29 MED ORDER — HEPARIN (PORCINE) IN NACL 2-0.9 UNIT/ML-% IJ SOLN
INTRAMUSCULAR | Status: DC | PRN
  Administered 2016-07-29: 1000 mL via INTRA_ARTERIAL

## 2016-07-29 MED ORDER — ALPRAZOLAM 0.5 MG PO TABS
2.0000 mg | ORAL_TABLET | Freq: Every day | ORAL | Status: DC
  Administered 2016-07-29 (×2): 2 mg via ORAL
  Filled 2016-07-29 (×2): qty 4

## 2016-07-29 MED ORDER — DIPHENHYDRAMINE HCL 25 MG PO CAPS: 25.0000 mg | ORAL_CAPSULE | Freq: Four times a day (QID) | ORAL | Status: DC | PRN

## 2016-07-29 MED ORDER — ALPRAZOLAM 0.5 MG PO TABS
1.0000 mg | ORAL_TABLET | Freq: Three times a day (TID) | ORAL | Status: DC
  Administered 2016-07-29 – 2016-07-30 (×5): 1 mg via ORAL
  Filled 2016-07-29 (×5): qty 2

## 2016-07-29 MED ORDER — PREGABALIN 25 MG PO CAPS
50.0000 mg | ORAL_CAPSULE | Freq: Every day | ORAL | Status: DC
  Administered 2016-07-29 (×2): 50 mg via ORAL
  Filled 2016-07-29: qty 1
  Filled 2016-07-29: qty 2

## 2016-07-29 MED ORDER — CLOPIDOGREL BISULFATE 300 MG PO TABS
ORAL_TABLET | ORAL | Status: DC | PRN
  Administered 2016-07-29: 600 mg via ORAL

## 2016-07-29 MED ORDER — ASPIRIN 81 MG PO CHEW
81.0000 mg | CHEWABLE_TABLET | Freq: Every day | ORAL | Status: DC
  Administered 2016-07-29 – 2016-07-30 (×2): 81 mg via ORAL
  Filled 2016-07-29 (×2): qty 1

## 2016-07-29 MED ORDER — ATORVASTATIN CALCIUM 40 MG PO TABS
40.0000 mg | ORAL_TABLET | Freq: Every day | ORAL | Status: DC
  Administered 2016-07-29: 40 mg via ORAL
  Filled 2016-07-29: qty 1

## 2016-07-29 MED ORDER — MORPHINE SULFATE (PF) 2 MG/ML IV SOLN: 2.0000 mg | INTRAVENOUS | Status: DC | PRN

## 2016-07-29 MED ORDER — FENTANYL CITRATE (PF) 100 MCG/2ML IJ SOLN
INTRAMUSCULAR | Status: AC
  Filled 2016-07-29: qty 2

## 2016-07-29 MED ORDER — BIVALIRUDIN BOLUS VIA INFUSION - CUPID
INTRAVENOUS | Status: DC | PRN
  Administered 2016-07-29: 114 mg via INTRAVENOUS

## 2016-07-29 MED ORDER — IOPAMIDOL (ISOVUE-370) INJECTION 76%
INTRAVENOUS | Status: DC | PRN
  Administered 2016-07-29: 215 mL via INTRA_ARTERIAL

## 2016-07-29 MED ORDER — LISINOPRIL 5 MG PO TABS
5.0000 mg | ORAL_TABLET | Freq: Every day | ORAL | Status: DC
  Administered 2016-07-29 – 2016-07-30 (×2): 5 mg via ORAL
  Filled 2016-07-29 (×2): qty 1

## 2016-07-29 MED ORDER — MIDAZOLAM HCL 2 MG/2ML IJ SOLN
INTRAMUSCULAR | Status: DC | PRN
  Administered 2016-07-29: 1 mg via INTRAVENOUS

## 2016-07-29 MED ORDER — ALPRAZOLAM 0.25 MG PO TABS: 1.0000 mg | ORAL_TABLET | Freq: Four times a day (QID) | ORAL | Status: DC

## 2016-07-29 MED ORDER — ANGIOPLASTY BOOK
Freq: Once | Status: AC
  Administered 2016-07-29: 21:00:00
  Filled 2016-07-29: qty 1

## 2016-07-29 MED ORDER — AMLODIPINE BESYLATE 5 MG PO TABS
2.5000 mg | ORAL_TABLET | Freq: Every morning | ORAL | Status: DC
  Administered 2016-07-29 – 2016-07-30 (×2): 2.5 mg via ORAL
  Filled 2016-07-29 (×2): qty 1

## 2016-07-29 MED ORDER — SODIUM CHLORIDE 0.9 % IV SOLN
INTRAVENOUS | Status: AC
  Administered 2016-07-29: 17:00:00 via INTRAVENOUS

## 2016-07-29 MED ORDER — MIDAZOLAM HCL 2 MG/2ML IJ SOLN
INTRAMUSCULAR | Status: AC
  Filled 2016-07-29: qty 2

## 2016-07-29 MED ORDER — PNEUMOCOCCAL VAC POLYVALENT 25 MCG/0.5ML IJ INJ
0.5000 mL | INJECTION | INTRAMUSCULAR | Status: AC | PRN
  Administered 2016-07-30: 14:00:00 0.5 mL via INTRAMUSCULAR
  Filled 2016-07-29: qty 0.5

## 2016-07-29 MED ORDER — FLUTICASONE PROPIONATE 50 MCG/ACT NA SUSP: 2.0000 | Freq: Every day | NASAL | Status: DC | PRN

## 2016-07-29 MED ORDER — LIDOCAINE HCL (PF) 1 % IJ SOLN
INTRAMUSCULAR | Status: AC
  Filled 2016-07-29: qty 30

## 2016-07-29 MED ORDER — PREGABALIN 100 MG PO CAPS
100.0000 mg | ORAL_CAPSULE | Freq: Every day | ORAL | Status: DC
  Administered 2016-07-29 – 2016-07-30 (×2): 100 mg via ORAL
  Filled 2016-07-29 (×2): qty 1

## 2016-07-29 MED ORDER — HEPARIN SODIUM (PORCINE) 1000 UNIT/ML IJ SOLN
INTRAMUSCULAR | Status: DC | PRN
  Administered 2016-07-29: 7000 [IU] via INTRAVENOUS

## 2016-07-29 MED ORDER — IOPAMIDOL (ISOVUE-370) INJECTION 76%
INTRAVENOUS | Status: AC
  Filled 2016-07-29: qty 125

## 2016-07-29 MED ORDER — BIVALIRUDIN 250 MG IV SOLR
INTRAVENOUS | Status: AC
Start: 2016-07-29 — End: 2016-07-29
  Filled 2016-07-29: qty 250

## 2016-07-29 MED ORDER — ALBUTEROL SULFATE (2.5 MG/3ML) 0.083% IN NEBU: 2.5000 mg | INHALATION_SOLUTION | RESPIRATORY_TRACT | Status: DC | PRN

## 2016-07-29 MED ORDER — ACETAMINOPHEN 325 MG PO TABS: 650.0000 mg | ORAL_TABLET | ORAL | Status: DC | PRN

## 2016-07-29 MED ORDER — BECLOMETHASONE DIPROPIONATE 80 MCG/ACT IN AERS: 2.0000 | INHALATION_SPRAY | Freq: Two times a day (BID) | RESPIRATORY_TRACT | Status: DC | PRN

## 2016-07-29 MED ORDER — SODIUM CHLORIDE 0.9 % IV SOLN: INTRAVENOUS | Status: DC

## 2016-07-29 MED ORDER — BUPROPION HCL ER (XL) 150 MG PO TB24
150.0000 mg | ORAL_TABLET | Freq: Two times a day (BID) | ORAL | Status: DC
  Administered 2016-07-29 – 2016-07-30 (×3): 150 mg via ORAL
  Filled 2016-07-29 (×5): qty 1

## 2016-07-29 MED ORDER — OXYCODONE HCL 5 MG PO TABS
15.0000 mg | ORAL_TABLET | Freq: Every day | ORAL | Status: DC
  Administered 2016-07-29 – 2016-07-30 (×8): 15 mg via ORAL
  Filled 2016-07-29 (×8): qty 3

## 2016-07-29 MED ORDER — ALBUTEROL SULFATE HFA 108 (90 BASE) MCG/ACT IN AERS: 2.0000 | INHALATION_SPRAY | Freq: Four times a day (QID) | RESPIRATORY_TRACT | Status: DC | PRN

## 2016-07-29 MED ORDER — PREGABALIN 50 MG PO CAPS: 50.0000 mg | ORAL_CAPSULE | ORAL | Status: DC

## 2016-07-29 MED ORDER — INSULIN GLARGINE 100 UNIT/ML ~~LOC~~ SOLN
35.0000 [IU] | Freq: Two times a day (BID) | SUBCUTANEOUS | Status: DC
  Administered 2016-07-29 (×3): 35 [IU] via SUBCUTANEOUS
  Filled 2016-07-29 (×6): qty 0.35

## 2016-07-29 MED ORDER — LIDOCAINE HCL (PF) 1 % IJ SOLN
INTRAMUSCULAR | Status: DC | PRN
  Administered 2016-07-29: 3 mL

## 2016-07-29 MED ORDER — BIVALIRUDIN 250 MG IV SOLR
INTRAVENOUS | Status: AC
  Filled 2016-07-29: qty 250

## 2016-07-29 MED ORDER — MECLIZINE HCL 25 MG PO TABS
25.0000 mg | ORAL_TABLET | Freq: Three times a day (TID) | ORAL | Status: DC | PRN
  Filled 2016-07-29: qty 1

## 2016-07-29 MED ORDER — SODIUM CHLORIDE 0.9 % IV SOLN
1.7500 mg/kg/h | INTRAVENOUS | Status: AC
  Administered 2016-07-29 (×3): 1.75 mg/kg/h via INTRAVENOUS
  Filled 2016-07-29 (×6): qty 250

## 2016-07-29 MED ORDER — HEPARIN (PORCINE) IN NACL 2-0.9 UNIT/ML-% IJ SOLN
INTRAMUSCULAR | Status: AC
  Filled 2016-07-29: qty 1000

## 2016-07-29 MED ORDER — FENTANYL CITRATE (PF) 100 MCG/2ML IJ SOLN
INTRAMUSCULAR | Status: DC | PRN
  Administered 2016-07-29: 25 ug via INTRAVENOUS

## 2016-07-29 MED ORDER — HEPARIN SODIUM (PORCINE) 1000 UNIT/ML IJ SOLN
INTRAMUSCULAR | Status: AC
  Filled 2016-07-29: qty 1

## 2016-07-29 SURGICAL SUPPLY — 18 items
BALLN EMERGE MR 2.0X12 (BALLOONS) ×2
BALLOON EMERGE MR 2.0X12 (BALLOONS) IMPLANT
CATH INFINITI 5 FR JL3.5 (CATHETERS) ×1 IMPLANT
CATH INFINITI 5FR ANG PIGTAIL (CATHETERS) ×2 IMPLANT
CATH OPTITORQUE TIG 4.0 5F (CATHETERS) ×2 IMPLANT
CATH VISTA GUIDE 6FR XB3.5 (CATHETERS) ×1 IMPLANT
DEVICE RAD COMP TR BAND LRG (VASCULAR PRODUCTS) ×2 IMPLANT
GLIDESHEATH SLEND A-KIT 6F 22G (SHEATH) ×3 IMPLANT
KIT ENCORE 26 ADVANTAGE (KITS) ×1 IMPLANT
KIT HEART LEFT (KITS) ×2 IMPLANT
PACK CARDIAC CATHETERIZATION (CUSTOM PROCEDURE TRAY) ×2 IMPLANT
STENT VISION RX 2.75X15 (Permanent Stent) ×1 IMPLANT
TRANSDUCER W/STOPCOCK (MISCELLANEOUS) ×2 IMPLANT
TUBING CIL FLEX 10 FLL-RA (TUBING) ×2 IMPLANT
WIRE ASAHI PROWATER 180CM (WIRE) ×1 IMPLANT
WIRE HI TORQ VERSACORE-J 145CM (WIRE) ×2 IMPLANT
WIRE ROSEN-J .035X260CM (WIRE) ×2 IMPLANT
WIRE SAFE-T 1.5MM-J .035X260CM (WIRE) ×1 IMPLANT

## 2016-07-29 NOTE — Progress Notes (Signed)
CRITICAL VALUE ALERT  Critical value received:  INR 5.85   Date of notification:  07/29/16  Time of notification:  1730  Critical value read back:Yes.    Nurse who received alert:  Donna ChristenJim Ondre Salvetti   MD notified (1st page):  Bary CastillaKaty Thompson, [place]  Time of first page:  1733  MD notified (2nd page):  Time of second page:  Responding MD:  Bary CastillaKaty Thompson, PA  Time MD responded:  [phone]

## 2016-07-29 NOTE — H&P (Signed)
History and Physical    Joseph Albrighterry W Ruffolo ZOX:[id number] DOB: 03-Apr-1971 DOA: 07/28/2016   PCP: Egbert GaribaldiMillsaps, KIMBERLY M, NP Chief Complaint:  Chief Complaint  Patient presents with  . Chest Pain    HPI: Joseph [name] is a 45 y.o. male with medical history significant of CAD, DM, HLD, HTN, PE, prior cocaine use in distant past, ongoing smoking 0.5 ppd currently.  Patient presents to the ED with c/o chest pain.  Symptoms onset 3 weeks ago, left sided CP with radiation to left shoulder.  Quality is like "elephant sitting on chest".  Associated fatigue, cough, SOB.  Pain worse with exertion, better at rest.  ED Course: Trop negative, EKG unchanged.  Review of Systems: As per HPI otherwise 10 point review of systems negative.    Past Medical History:  Diagnosis Date  . Anxiety   . CAD (coronary artery disease)   . Diabetes mellitus   . Hyperlipidemia   . Hypertension   . PE (pulmonary embolism)     Past Surgical History:  Procedure Laterality Date  . carpel tunnel    . knee arthoplasty    . SHOULDER SURGERY    . SHOULDER SURGERY       reports that he has been smoking Cigarettes.  He has been smoking about 0.30 packs per day. He has quit using smokeless tobacco. He reports that he does not drink alcohol or use drugs.  Allergies  Allergen Reactions  . Sulfa Antibiotics Other (See Comments)    headache    Family History  Problem Relation Age of Onset  . Hypertension Brother   . Hypertension Father       Prior to Admission medications   Medication Sig Start Date End Date Taking? Authorizing Provider  albuterol (PROAIR HFA) 108 (90 BASE) MCG/ACT inhaler Inhale 2 puffs into the lungs every 6 (six) hours as needed for wheezing or shortness of breath.    Yes Historical Provider, MD  albuterol (PROVENTIL) (2.5 MG/3ML) 0.083% nebulizer solution Take 2.5 mg by nebulization every 4 (four) hours as needed for wheezing or shortness of breath.   Yes Historical Provider, MD    ALPRAZolam Prudy Feeler(XANAX) 1 MG tablet Take 1-2 mg by mouth 4 (four) times daily. 2 mg every evening   Yes Historical Provider, MD  amLODipine (NORVASC) 2.5 MG tablet Take 2.5 mg by mouth every morning.    Yes Historical Provider, MD  atorvastatin (LIPITOR) 40 MG tablet Take 1 tablet (40 mg total) by mouth daily. Patient taking differently: Take 40 mg by mouth daily at 6 PM.  07/29/15  Yes Chilton Siiffany Buchanan, MD  beclomethasone (QVAR) 80 MCG/ACT inhaler Inhale 2 puffs into the lungs 2 (two) times daily as needed (for wheezing or shortness of breath).   Yes Historical Provider, MD  buPROPion (WELLBUTRIN XL) 150 MG 24 hr tablet Take 150 mg by mouth 2 (two) times daily.     Yes Historical Provider, MD  diclofenac (VOLTAREN) 75 MG EC tablet Take 75 mg by mouth 2 (two) times daily.   Yes Historical Provider, MD  diphenhydrAMINE (BENADRYL) 25 mg capsule Take 25 mg by mouth every 6 (six) hours as needed for itching or allergies.   Yes Historical Provider, MD  fenofibrate (TRICOR) 145 MG tablet Take 145 mg by mouth every morning.    Yes Historical Provider, MD  fluconazole (DIFLUCAN) 100 MG tablet Take 100 mg by mouth every Monday.  03/12/15  Yes Historical Provider, MD  fluticasone (FLONASE) 50 MCG/ACT nasal spray Place  2 sprays into both nostrils daily as needed for allergies or rhinitis.   Yes Historical Provider, MD  glucose blood (ONETOUCH VERIO) test strip Use to test blood sugar 3 times daily as instructed. Dx: E10.9 04/29/15  Yes Reather Littler, MD  HUMALOG KWIKPEN 100 UNIT/ML SOPN Inject 35-40 Units into the skin 3 (three) times daily with meals. As directed per sliding scale 05/02/13  Yes Historical Provider, MD  ibuprofen (ADVIL,MOTRIN) 200 MG tablet Take 1,200-1,400 mg by mouth 3 (three) times daily as needed for pain.    Yes Historical Provider, MD  Insulin Pen Needle 32G X 4 MM MISC Use to inject insulin 4 times daily. Dx: E10.9 04/29/15  Yes Reather Littler, MD  LANTUS SOLOSTAR 100 UNIT/ML Solostar Pen Inject 70  Units into the skin 2 (two) times daily.  12/01/14  Yes Historical Provider, MD  lisinopril (PRINIVIL,ZESTRIL) 5 MG tablet Take 5 mg by mouth daily. 12/29/14  Yes Historical Provider, MD  LYRICA 50 MG capsule Take 50-100 mg by mouth See admin instructions. 100 mg every morning and 50 mg every evening 02/16/15  Yes Historical Provider, MD  meclizine (ANTIVERT) 25 MG tablet Take 25 mg by mouth 3 (three) times daily as needed for dizziness. 25 mg every morning and 25 mg every evening.  May take an additional 25 mg during the day 04/10/13  Yes Historical Provider, MD  metFORMIN (GLUCOPHAGE) 1000 MG tablet Take 1,000 mg by mouth 2 (two) times daily with a meal.     Yes Historical Provider, MD  nitroGLYCERIN (NITROSTAT) 0.4 MG SL tablet Place 0.4 mg under the tongue every 5 (five) minutes as needed for chest pain.   Yes Historical Provider, MD  omeprazole (PRILOSEC) 40 MG capsule Take 40 mg by mouth daily.  04/22/13  Yes Historical Provider, MD  oxyCODONE (ROXICODONE) 15 MG immediate release tablet Take 15 mg by mouth 5 (five) times daily.    Yes Historical Provider, MD  promethazine (PHENERGAN) 25 MG tablet Take 25 mg by mouth every 6 (six) hours as needed for nausea.    Yes Historical Provider, MD  Rivaroxaban (XARELTO) 20 MG TABS Take 1 tablet (20 mg total) by mouth daily. 05/28/13  Yes Lesle Chris Black, NP  tiZANidine (ZANAFLEX) 4 MG tablet Take 4 mg by mouth daily as needed for muscle spasms.  02/17/15  Yes Historical Provider, MD  VICTOZA 18 MG/3ML SOPN Inject 0.2 mLs (1.2 mg total) into the skin daily. Inject once daily at the same time 04/29/15  Yes Reather Littler, MD    Physical Exam: Vitals:   07/28/16 2259 07/28/16 2330 07/29/16 0000 07/29/16 0030  BP: 107/74 101/76 109/78 116/75  Pulse: 97 96 96 99  Resp:      Temp:      TempSrc:      SpO2: 96% 96% 95% 97%  Weight:      Height:          Constitutional: NAD, calm, comfortable Eyes: PERRL, lids and conjunctivae normal ENMT: Mucous membranes are  moist. Posterior pharynx clear of any exudate or lesions.Normal dentition.  Neck: normal, supple, no masses, no thyromegaly Respiratory: clear to auscultation bilaterally, no wheezing, no crackles. Normal respiratory effort. No accessory muscle use.  Cardiovascular: Regular rate and rhythm, no murmurs / rubs / gallops. No extremity edema. 2+ pedal pulses. No carotid bruits.  Abdomen: no tenderness, no masses palpated. No hepatosplenomegaly. Bowel sounds positive.  Musculoskeletal: no clubbing / cyanosis. No joint deformity upper and lower extremities. Good ROM,  no contractures. Normal muscle tone.  Skin: no rashes, lesions, ulcers. No induration Neurologic: CN 2-12 grossly intact. Sensation intact, DTR normal. Strength 5/5 in all 4.  Psychiatric: Normal judgment and insight. Alert and oriented x 3. Normal mood.    Labs on Admission: I have personally reviewed following labs and imaging studies  CBC:  Recent Labs Lab 07/28/16 1713  WBC 12.3*  HGB 15.8  HCT 47.2  MCV 87.6  PLT 280   Basic Metabolic Panel:  Recent Labs Lab 07/28/16 1713  NA 137  K 4.9  CL 106  CO2 20*  GLUCOSE 376*  BUN 17  CREATININE 1.17  CALCIUM 10.0   GFR: Estimated Creatinine Clearance: 130.9 mL/min (by C-G formula based on SCr of 1.17 mg/dL). Liver Function Tests: No results for input(s): AST, ALT, ALKPHOS, BILITOT, PROT, ALBUMIN in the last 168 hours. No results for input(s): LIPASE, AMYLASE in the last 168 hours. No results for input(s): AMMONIA in the last 168 hours. Coagulation Profile: No results for input(s): INR, PROTIME in the last 168 hours. Cardiac Enzymes: No results for input(s): CKTOTAL, CKMB, CKMBINDEX, TROPONINI in the last 168 hours. BNP (last 3 results) No results for input(s): PROBNP in the last 8760 hours. HbA1C: No results for input(s): HGBA1C in the last 72 hours. CBG: No results for input(s): GLUCAP in the last 168 hours. Lipid Profile: No results for input(s): CHOL,  HDL, LDLCALC, TRIG, CHOLHDL, LDLDIRECT in the last 72 hours. Thyroid Function Tests: No results for input(s): TSH, T4TOTAL, FREET4, T3FREE, THYROIDAB in the last 72 hours. Anemia Panel: No results for input(s): VITAMINB12, FOLATE, FERRITIN, TIBC, IRON, RETICCTPCT in the last 72 hours. Urine analysis:    Component Value Date/Time   COLORURINE YELLOW 10/27/2007 1550   APPEARANCEUR CLEAR 10/27/2007 1550   LABSPEC 1.027 10/27/2007 1550   PHURINE 5.5 10/27/2007 1550   GLUCOSEU NEGATIVE 10/27/2007 1550   HGBUR NEGATIVE 10/27/2007 1550   BILIRUBINUR NEGATIVE 10/27/2007 1550   KETONESUR NEGATIVE 10/27/2007 1550   PROTEINUR NEGATIVE 10/27/2007 1550   UROBILINOGEN 0.2 10/27/2007 1550   NITRITE NEGATIVE 10/27/2007 1550   LEUKOCYTESUR  10/27/2007 1550    NEGATIVE MICROSCOPIC NOT DONE ON URINES WITH NEGATIVE PROTEIN, BLOOD, LEUKOCYTES, NITRITE, OR GLUCOSE <1000 mg/dL.   Sepsis Labs: @LABRCNTIP (procalcitonin:4,lacticidven:4) )No results found for this or any previous visit (from the past 240 hour(s)).   Radiological Exams on Admission: Dg Chest 2 View  Result Date: 07/28/2016 CLINICAL DATA:  Left-sided chest pain radiating to left shoulder. Shortness of breath for 3 weeks. EXAM: CHEST  2 VIEW COMPARISON:  08/15/2013 FINDINGS: The heart size and mediastinal contours are within normal limits. Both lungs are clear. The visualized skeletal structures are unremarkable. IMPRESSION: No active cardiopulmonary disease. Electronically Signed   By: Myles Rosenthal M.D.   On: 07/28/2016 17:43    EKG: Independently reviewed.  Assessment/Plan Principal Problem:   Chest pain, moderate coronary artery risk Active Problems:   Type 1 diabetes mellitus (HCC)   Essential hypertension    CP mod risk -  CP obs pathway  Serial trops  Tele monitor  NPO  Call cards in AM for stress test vs cath  DM -  Hold home meds  Give half home lantus (35 BID)  Resistant scale SSI Q4H  HTN - continue home  meds  Smoking - did try to convince him to quit, he didn't really seem interested in this though.   DVT prophylaxis: Lovenox Code Status: Full Family Communication: Wife at bedside Consults called:  None Admission status: Place in obs   Hillary Bow. DO [facility] Pager [phone] from 7PM-7AM  If 7AM-7PM, please contact the day physician for the patient [email or web address] Password [email or web address]  07/29/2016, 12:57 AM

## 2016-07-29 NOTE — Progress Notes (Addendum)
     Called for stat lab of INR 5.8. No s/s bleeding. Angiomax to go 4 hours post cath and got Xarelto at 5:30 pm today. Discussed with Dr. Allyson SabalBerry. Will repeat lab and continue to monitor.    Cline CrockKathryn Jontae Sonier PA-C  [facility]   ADDENDUM: discussed with pharmacy and they are not surprised with this lab finding as he is on Xarelto and angiomax and nothing to worry about. WIll cancel repeat INR.

## 2016-07-29 NOTE — Consult Note (Signed)
Date: 07/29/2016               Patient Name:  Joseph [name] MRN: [id number]  DOB: 05/02/1971 Age / Sex: 45 y.o., male   PCP: Marva Panda, NP         Requesting Physician: Dr. Richarda Overlie, MD    Consulting Reason:  Chest pain     Chief Complaint: Chest Pain  History of Present Illness: Joseph [name] is a 45 y.o. male with PMH of HTN, DM,HLD. OSA on CPAP,PE(2014)h/o heart attack after Cocain use in 1996??. Pt. Came to ED with h/o intermittent substernal chest pain,pressure like, describe as elephant sitting on my chest for 2-3 wks.Pain starts with exersion, radiating to left shoulder and left arm, accompanied with palpitation,mild diaphoresis, dyspnea and nausea.Pain relieved with resting and with Xanax. Pain is pretty constant since last 2 days, getting partial relieve with rest and Xanax. At worse it is 9-10/10 and it was 4-5/10 at rest currently. He denies any increase in his regular cough,fever,vomiting,orthopnea or PND. Denies any recent change in his appetite,wt,bowel or urinary habits. He is on Xeralto since 2014 after PE, last dose was yesterday morning.  Meds: Current Facility-Administered Medications  Medication Dose Route Frequency Provider Last Rate Last Dose  . acetaminophen (TYLENOL) tablet 650 mg  650 mg Oral Q4H PRN Hillary Bow, DO      . albuterol (PROVENTIL) (2.5 MG/3ML) 0.083% nebulizer solution 2.5 mg  2.5 mg Nebulization Q4H PRN Hillary Bow, DO      . ALPRAZolam Prudy Feeler) tablet 1 mg  1 mg Oral TID WC Hillary Bow, DO   1 mg at 07/29/16 1055   And  . ALPRAZolam Prudy Feeler) tablet 2 mg  2 mg Oral QHS Hillary Bow, DO   2 mg at 07/29/16 0242  . amLODipine (NORVASC) tablet 2.5 mg  2.5 mg Oral q morning - 10a Hillary Bow, DO   2.5 mg at 07/29/16 1054  . atorvastatin (LIPITOR) tablet 40 mg  40 mg Oral q1800 Hillary Bow, DO      . beclomethasone (QVAR) 80 MCG/ACT inhaler 2 puff  2 puff Inhalation BID PRN Hillary Bow, DO      . buPROPion  (WELLBUTRIN XL) 24 hr tablet 150 mg  150 mg Oral BID WC Hillary Bow, DO   150 mg at 07/29/16 0857  . diphenhydrAMINE (BENADRYL) capsule 25 mg  25 mg Oral Q6H PRN Hillary Bow, DO      . fenofibrate tablet 160 mg  160 mg Oral Daily Hillary Bow, DO   160 mg at 07/29/16 1055  . [START ON 08/02/2016] fluconazole (DIFLUCAN) tablet 100 mg  100 mg Oral Q Mon Jared M Gardner, DO      . fluticasone [facility]) 50 MCG/ACT nasal spray 2 spray  2 spray Each Nare Daily PRN Hillary Bow, DO      . insulin aspart (novoLOG) injection 0-20 Units  0-20 Units Subcutaneous Q4H Hillary Bow, DO   7 Units at 07/29/16 1219  . insulin glargine (LANTUS) injection 35 Units  35 Units Subcutaneous BID Hillary Bow, DO   35 Units at 07/29/16 1055  . lisinopril (PRINIVIL,ZESTRIL) tablet 5 mg  5 mg Oral Daily Hillary Bow, DO   5 mg at 07/29/16 1055  . meclizine (ANTIVERT) tablet 25 mg  25 mg Oral TID PRN Hillary Bow, DO      . ondansetron [facility]) injection 4 mg  4 mg Intravenous Q6H PRN Hillary BowJared M Gardner, DO      . oxyCODONE (Oxy IR/ROXICODONE) immediate release tablet 15 mg  15 mg Oral 5 X Daily Hillary BowJared M Gardner, DO   15 mg at 07/29/16 1055  . pantoprazole (PROTONIX) EC tablet 80 mg  80 mg Oral Daily Hillary BowJared M Gardner, DO   80 mg at 07/29/16 1054  . pregabalin (LYRICA) capsule 100 mg  100 mg Oral Daily Hillary BowJared M Gardner, DO   100 mg at 07/29/16 1054   And  . pregabalin (LYRICA) capsule 50 mg  50 mg Oral QHS Hillary BowJared M Gardner, DO   50 mg at 07/29/16 0248  . promethazine (PHENERGAN) tablet 25 mg  25 mg Oral Q6H PRN Hillary BowJared M Gardner, DO      . rivaroxaban (XARELTO) tablet 20 mg  20 mg Oral QAC supper Hillary BowJared M Gardner, DO      . tiZANidine (ZANAFLEX) tablet 4 mg  4 mg Oral Daily PRN Hillary BowJared M Gardner, DO        Allergies: Allergies as of 07/28/2016 - Review Complete 07/28/2016  Allergen Reaction Noted  . Sulfa antibiotics Other (See Comments) 10/06/2012   Past Medical History:  Diagnosis Date  . Anxiety   .  CAD (coronary artery disease)   . Diabetes mellitus   . Hyperlipidemia   . Hypertension   . PE (pulmonary embolism)    Past Surgical History:  Procedure Laterality Date  . carpel tunnel    . knee arthoplasty    . SHOULDER SURGERY    . SHOULDER SURGERY     Family History  Problem Relation Age of Onset  . Hypertension Brother   . Hypertension Father    Social History   Social History  . Marital status: Married    Spouse name: N/A  . Number of children: N/A  . Years of education: N/A   Occupational History  . disabled  Unemployed   Social History Main Topics  . Smoking status: Current Every Day Smoker    Packs/day: 0.30    Types: Cigarettes  . Smokeless tobacco: Former NeurosurgeonUser  . Alcohol use No  . Drug use: No  . Sexual activity: Not on file   Other Topics Concern  . Not on file   Social History Narrative  . No narrative on file    Review of Systems: Pertinent items are noted in HPI.  Physical Exam: Blood pressure 96/72, pulse 97, temperature 97.7 F (36.5 C), temperature source Oral, resp. rate 18, height 6\' 5"  (1.956 m), weight (!) 335 lb 3.2 oz (152 kg), SpO2 99 %. BP 96/72 (BP Location: Left Arm)   Pulse 97   Temp 97.7 F (36.5 C) (Oral)   Resp 18   Ht 6\' 5"  (1.956 m)   Wt (!) 335 lb 3.2 oz (152 kg)   SpO2 99%   BMI 39.75 kg/m   General Appearance:   Obese, Alert, cooperative, no distress, appears stated age  Head:    Normocephalic, without obvious abnormality, atraumatic  Eyes:    PERRL, conjunctiva/corneas clear, EOM's intact, fundi    benign, both eyes          Nose:   Nares normal, septum midline, mucosa normal, no drainage    or sinus tenderness  Throat:   Lips, mucosa, and tongue normal; teeth and gums normal  Neck:   Supple, symmetrical, trachea midline, no adenopathy;       thyroid:  No enlargement/tenderness/nodules; no carotid  bruit or JVD  Back:     Symmetric, no curvature, ROM normal, no CVA tenderness  Lungs:     Clear to  auscultation bilaterally, respirations unlabored  Chest wall:    No tenderness or deformity  Heart:    Regular rate and rhythm, S1 and S2 normal, no murmur, rub   or gallop  Abdomen:     Soft, non-tender, bowel sounds active all four quadrants,    no masses, no organomegaly        Extremities:   Extremities normal, atraumatic, no cyanosis or edema  Pulses:   2+ and symmetric all extremities  Skin:   Skin color, texture, turgor normal, no rashes or lesions     Neurologic:   CNII-XII intact. Normal strength, sensation and reflexes      throughout     Lab results: CBC Latest Ref Rng & Units 07/28/2016 04/01/2014 08/15/2013  WBC 4.0 - 10.5 K/uL 12.3(H) 10.7(H) 11.5(H)  Hemoglobin 13.0 - 17.0 g/dL 16.1 09.6 04.5  Hematocrit 39.0 - 52.0 % 47.2 42.8 38.8(L)  Platelets 150 - 400 K/uL 280 297 295   BMP Latest Ref Rng & Units 07/28/2016 05/28/2015 04/01/2014  Glucose 65 - 99 mg/dL 409(W) 119(J) 478(G)  BUN 6 - 20 mg/dL 17 95(A) 19  Creatinine 0.61 - 1.24 mg/dL 2.13 0.86 5.78  Sodium 135 - 145 mmol/L 137 135 136(L)  Potassium 3.5 - 5.1 mmol/L 4.9 3.8 5.2  Chloride 101 - 111 mmol/L 106 104 94(L)  CO2 22 - 32 mmol/L 20(L) 25 28  Calcium 8.9 - 10.3 mg/dL 46.9 9.3 62.9   BMWU.<1.32 X 3 Mag. 1.5 CBG: 300  210  227  Imaging results: Dg Chest 2 View  Result Date: 07/28/2016 CLINICAL DATA:  Left-sided chest pain radiating to left shoulder. Shortness of breath for 3 weeks. EXAM: CHEST  2 VIEW COMPARISON:  08/15/2013 FINDINGS: The heart size and mediastinal contours are within normal limits. Both lungs are clear. The visualized skeletal structures are unremarkable. IMPRESSION: No active cardiopulmonary disease. Electronically Signed   By: Myles Rosenthal M.D.   On: 07/28/2016 17:43    Other results: EKG:  Sinus tachycardia Left anterior fascicular block Inferior infarct , age undetermined Anterolateral infarct , age undetermined Abnormal ECG  Assessment, Plan, & Recommendations by Problem: Joseph Timberman  [name] is a 45 y.o. male with PMH of HTN, DM,HLD. OSA on CPAP,PE(2014)h/o heart attack after Cocain use in 1996??. Pt. Came to ED with h/o intermittent substernal chest pain, describe as elephant sitting on my chest for 2-3 wks.Pain starts with exersion, radiating to left shoulder and left arm, accompanied with palpitation,mild diaphoresis, dyspnea and nausea.Pain relieved with resting and with Xanax.   Chest Pain: He has a chest pain very characteristic of ischemia, although his Trop. Is negative and his ECG does not show any acute changes. Pt. Was discussed and seen with Dr. Allyson Sabal. Considering his risk factors and pain characteristics with him having 4-5/10 pain currently at rest too, he will benefit from Cardiac Cath. To rule out any obstructive CAD. -ECHO to rule out any wall motion and structural abnormalities. -Cardiac Cath.  HTN: Seems stable on his home meds. -Continue home Amlodipine 2.5mg  QD,and lisinopril 5mg  QD.  DM; His CBG remains high during this visit. According to wife he has a very good control at home with Victoza but he ran out of supply for about 2 wks.recently got it back. -Continue lantus and SS.   HLD: Continue with home meds. Of Tricor  and Lipitor.  Smoking: Encourage to quit as it will increase his risk of CAD.  Dispo: Disposition is deferred at this time, awaiting improvement of current medical problems. Anticipated discharge in approximately 1-2 day(s).   The patient does have a current PCP Marva Panda, NP) and does need an [facility] hospital follow-up appointment after discharge.  The patient does not have transportation limitations that hinder transportation to clinic appointments.  Signed: Arnetha Courser, MD 07/29/2016, 1:37 PM   Agree with note by Dr Tilman Neat  Pt with + CRF, accel sscp with LUE radiation. H/O prior cath '05 without signif CAD. Neg functional study 2016. Exam benign. Labs OK. EKG w/o acute changes. Enz neg. Feel cor angio best option to R/O  CAD. The patient understands that risks included but are not limited to stroke (1 in 1000), death (1 in 1000), kidney failure [usually temporary] (1 in 500), bleeding (1 in 200), allergic reaction [possibly serious] (1 in 200). The patient understands and agrees to proceed  Runell Gess, M.D., FACP, [facility], Kathryne Eriksson [facility] [address] [place], [name]  [address]  [phone] 07/29/2016 2:48 PM

## 2016-07-29 NOTE — Progress Notes (Signed)
Patient seen and examined  Joseph [name] is a 45 y.o. male with medical history significant of CAD, DM, HLD, HTN, PE, prior cocaine use in distant past, ongoing smoking 0.5 ppd currently.Patient presents to the ED with c/o chest pain.  Symptoms onset 3 weeks ago, left sided CP with radiation to left shoulder. Patient is a poorly controlled diabetic, heart score around 4.Risk of MACE of 12-16.6%.   Plan #1 continue Npo pending cardiology consult,, he will benefit from Cardiac Cath. To rule out any obstructive CAD #2 2-D echo to rule out wall motion abnormalities #3 check hemoglobin A1c and obtain diabetes coordinator consult  

## 2016-07-29 NOTE — Interval H&P Note (Signed)
Cath Lab Visit (complete for each Cath Lab visit)  Clinical Evaluation Leading to the Procedure:   ACS: Yes.    Non-ACS:    Anginal Classification: CCS III  Anti-ischemic medical therapy: No Therapy  Non-Invasive Test Results: No non-invasive testing performed  Prior CABG: No previous CABG      History and Physical Interval Note:  07/29/2016 2:46 PM  Gwynneth Albrighterry W Henion  has presented today for surgery, with the diagnosis of cp  The various methods of treatment have been discussed with the patient and family. After consideration of risks, benefits and other options for treatment, the patient has consented to  Procedure(s): Left Heart Cath and Coronary Angiography (N/A) as a surgical intervention .  The patient's history has been reviewed, patient examined, no change in status, stable for surgery.  I have reviewed the patient's chart and labs.  Questions were answered to the patient's satisfaction.     Nanetta BattyBerry, Saya Mccoll

## 2016-07-29 NOTE — Progress Notes (Signed)
CPAP set up with L/FFM as per pts. home regimen, currently on room air, humidity filled, CPAP/Auto 12/5, placed on with wife @ bedside for trtial,, able to place on when ready.

## 2016-07-29 NOTE — H&P (View-Only) (Signed)
Patient seen and examined  Joseph [name] is a 45 y.o. male with medical history significant of CAD, DM, HLD, HTN, PE, prior cocaine use in distant past, ongoing smoking 0.5 ppd currently.Patient presents to the ED with c/o chest pain.  Symptoms onset 3 weeks ago, left sided CP with radiation to left shoulder. Patient is a poorly controlled diabetic, heart score around 4.Risk of MACE of 12-16.6%.   Plan #1 continue Npo pending cardiology consult,, he will benefit from Cardiac Cath. To rule out any obstructive CAD #2 2-D echo to rule out wall motion abnormalities #3 check hemoglobin A1c and obtain diabetes coordinator consult

## 2016-07-30 ENCOUNTER — Other Ambulatory Visit: Payer: Self-pay | Admitting: Physician Assistant

## 2016-07-30 ENCOUNTER — Encounter (HOSPITAL_COMMUNITY): Payer: Self-pay | Admitting: Cardiovascular Disease

## 2016-07-30 ENCOUNTER — Inpatient Hospital Stay (HOSPITAL_COMMUNITY): Payer: Medicare Other

## 2016-07-30 DIAGNOSIS — I25110 Atherosclerotic heart disease of native coronary artery with unstable angina pectoris: Secondary | ICD-10-CM | POA: Diagnosis not present

## 2016-07-30 DIAGNOSIS — Z955 Presence of coronary angioplasty implant and graft: Secondary | ICD-10-CM

## 2016-07-30 DIAGNOSIS — I2510 Atherosclerotic heart disease of native coronary artery without angina pectoris: Secondary | ICD-10-CM

## 2016-07-30 DIAGNOSIS — I200 Unstable angina: Secondary | ICD-10-CM

## 2016-07-30 LAB — COMPREHENSIVE METABOLIC PANEL
ALT: 26 U/L (ref 17–63)
AST: 20 U/L (ref 15–41)
Albumin: 3.1 g/dL — ABNORMAL LOW (ref 3.5–5.0)
Alkaline Phosphatase: 53 U/L (ref 38–126)
Anion gap: 7 (ref 5–15)
BUN: 17 mg/dL (ref 6–20)
CO2: 26 mmol/L (ref 22–32)
Calcium: 8.8 mg/dL — ABNORMAL LOW (ref 8.9–10.3)
Chloride: 104 mmol/L (ref 101–111)
Creatinine, Ser: 1.18 mg/dL (ref 0.61–1.24)
GFR calc Af Amer: >60 mL/min (ref >60)
GFR calc non Af Amer: >60 mL/min (ref >60)
Glucose, Bld: 235 mg/dL — ABNORMAL HIGH (ref 65–99)
Potassium: 4.4 mmol/L (ref 3.5–5.1)
Sodium: 137 mmol/L (ref 135–145)
Total Bilirubin: 0.5 mg/dL (ref 0.3–1.2)
Total Protein: 5.5 g/dL — ABNORMAL LOW (ref 6.5–8.1)

## 2016-07-30 LAB — CBC
HCT: 40.9 % (ref 39.0–52.0)
Hemoglobin: 13.2 g/dL (ref 13.0–17.0)
MCH: 28.8 pg (ref 26.0–34.0)
MCHC: 32.3 g/dL (ref 30.0–36.0)
MCV: 89.3 fL (ref 78.0–100.0)
Platelets: 205 10*3/uL (ref 150–400)
RBC: 4.58 MIL/uL (ref 4.22–5.81)
RDW: 13.1 % (ref 11.5–15.5)
WBC: 10.5 10*3/uL (ref 4.0–10.5)

## 2016-07-30 LAB — ECHOCARDIOGRAM COMPLETE
Height: 77 in
Weight: 5414.5 oz

## 2016-07-30 LAB — HEMOGLOBIN A1C
Hgb A1c MFr Bld: 11.6 % — ABNORMAL HIGH (ref 4.8–5.6)
Mean Plasma Glucose: 286 mg/dL

## 2016-07-30 LAB — GLUCOSE, CAPILLARY
Glucose-Capillary: 260 mg/dL — ABNORMAL HIGH (ref 65–99)
Glucose-Capillary: 313 mg/dL — ABNORMAL HIGH (ref 65–99)

## 2016-07-30 MED ORDER — PERFLUTREN LIPID MICROSPHERE
1.0000 mL | INTRAVENOUS | Status: AC | PRN
  Administered 2016-07-30: 2 mL via INTRAVENOUS
  Filled 2016-07-30: qty 10

## 2016-07-30 MED ORDER — ASPIRIN 81 MG PO CHEW: 81.0000 mg | CHEWABLE_TABLET | Freq: Every day | ORAL | 30 refills | Status: DC

## 2016-07-30 MED ORDER — INSULIN GLARGINE 100 UNIT/ML ~~LOC~~ SOLN: 70.0000 [IU] | Freq: Two times a day (BID) | SUBCUTANEOUS | Status: DC

## 2016-07-30 MED ORDER — PANTOPRAZOLE SODIUM 40 MG PO TBEC: 80.0000 mg | DELAYED_RELEASE_TABLET | Freq: Every day | ORAL | 3 refills | Status: DC

## 2016-07-30 MED ORDER — INSULIN GLARGINE 100 UNIT/ML ~~LOC~~ SOLN
45.0000 [IU] | Freq: Two times a day (BID) | SUBCUTANEOUS | Status: DC
  Administered 2016-07-30: 11:00:00 45 [IU] via SUBCUTANEOUS
  Filled 2016-07-30 (×2): qty 0.45

## 2016-07-30 MED ORDER — CLOPIDOGREL BISULFATE 75 MG PO TABS: 75.0000 mg | ORAL_TABLET | Freq: Every day | ORAL | 0 refills | Status: DC

## 2016-07-30 MED FILL — Dopamine in Dextrose 5% Inj 3.2 MG/ML: INTRAVENOUS | Qty: 250 | Status: AC

## 2016-07-30 NOTE — Discharge Summary (Signed)
Discharge Summary    Patient ID: Joseph [name],  MRN: [id number], DOB/AGE: 1971-05-01 45 y.o.  Admit date: 07/28/2016 Discharge date: 07/30/2016  Primary Care Provider: Egbert Garibaldi Primary Cardiologist: New ( Dr. Allyson Sabal)   Discharge Diagnoses    Principal Problem:   Chest pain, moderate coronary artery risk Active Problems:   Type 1 diabetes mellitus (HCC)   Essential hypertension   Hyperglycemia   Unstable angina (HCC)   Tobacco smoking   Allergies Allergies  Allergen Reactions  . Sulfa Antibiotics Other (See Comments)    headache    Diagnostic Studies/Procedures    Procedures   Coronary Stent Intervention  Left Heart Cath and Coronary Angiography  Conclusion     Dist Cx lesion, 80 %stenosed.  Post intervention, there is a 0% residual stenosis.  A stent was successfully placed.   IMPRESSION: [name] had a high-grade distal circumflex stenosis. I stented this with a bare-metal stent given the fact that he is on chronic oral anticoagulation from prior pulmonary emboli. We will continue full dose angina for 4 hours. The patient will be treated with low-dose aspirin, Plavix and we will begin his Xarelto tomorrow. He will continue Plavix for 1 month after which this will be discontinued.   Echo 07/30/16 LV EF: 55% -   60%  ------------------------------------------------------------------- Indications:      CAD of native vessels 414.01.  ------------------------------------------------------------------- History:   PMH:  Dist Cx lesion, 80% stenosed; no residual stenosis post intervention.  Obstructive Sleep Apnea. Right Pulmonary Embolism.  PMH:  Substance Abuse.  Risk factors:  Hypertension. Diabetes mellitus. Dyslipidemia.  ------------------------------------------------------------------- Study Conclusions  - Left ventricle: The cavity size was normal. Wall thickness was   increased in a pattern of mild LVH. Systolic function was  normal.   The estimated ejection fraction was in the range of 55% to 60%.   Wall motion was normal; there were no regional wall motion   abnormalities. Doppler parameters are consistent with abnormal   left ventricular relaxation (grade 1 diastolic dysfunction). - Aortic root: The aortic root was mildly dilated.  Impressions:  - Technically difficult; definity used; normal LV systolic   function; grade 1 diastolic dysfunction; indeterminant filling   pressure; fusion of left and right aortic cusps; no AS or AI by   doppler; mildly dilated aortic root.   History of Present Illness     Joseph [name] a 45 y.o.malewith PMH of HTN, DM,HLD. OSA on CPAP,PE(2014)h/o heart attack after Cocain use in 1996?? Who caame to ED 07/29/10 with h/o intermittent substernal chest pain,pressure like, describe as elephant sitting on my chest for 2-3 wks. Pain starts with exersion, radiating to left shoulder and left arm, accompanied with palpitation,mild diaphoresis, dyspnea and nausea.Pain relieved with resting and with Xanax. Pain is pretty constant since last 2 days, getting partial relieve with rest and Xanax. At worse it is 9-10/10 and it was 4-5/10 at rest currently. He denies any increase in his regular cough,fever,vomiting,orthopnea or PND. Denies any recent change in his appetite,wt,bowel or urinary habits.  He is on Xeralto since 2014 after PE, last dose 07/28/16  EKG without acute changes. Troponin negative.    Hospital Course     Consultants: Internal medicine  Due to symptoms concerning for unstable angina the patient admitted and taken to cath lab same for definite evaluation of coronary anatomy. Cath done by Dr. Allyson Sabal that showed high-grade distal circumflex stenosis s/p  bare-metal stent given the fact that he  is on chronic oral anticoagulation from prior pulmonary emboli. Angiomax given 4 hours post cath. Echo showed normal LV systolic nction; grade 1 diastolic dysfunction; indeterminant  filling pressure; fusion of left and right aortic cusps; no AS or AI by doppler; mildly dilated aortic root. Continue ASA 81mg , Plavix 75mg  and Xarelto for 30 days then drop the plavix. Ambulated without chest pain. Convinced him to quit, he didn't really seem interested in this though. Blood sugar was elevated and placed on SSI. CRP 2  [facility]. Consider OP sleep study. Advised to limit use of NSAID. Prilosec changes to Protonix while on Plavix.   LDL was 48 05/28/15. Will need repeat LFT and lipid panel during next OV. Adjust Lipitor dose pending result. A1c of 11.6 during admission. Will f/u with PCP.   The patient has been seen by Dr. Allyson SabalBerry  today and deemed ready for discharge home. All follow-up appointments have been scheduled. Discharge medications are listed below.   Discharge Vitals Blood pressure 129/85, pulse 92, temperature 97.8 F (36.6 C), temperature source Oral, resp. rate 12, height 6\' 5"  (1.956 m), weight (!) 338 lb 6.5 oz (153.5 kg), SpO2 100 %.  Filed Weights   07/28/16 1716 07/29/16 0203 07/30/16 0349  Weight: (!) 332 lb (150.6 kg) (!) 335 lb 3.2 oz (152 kg) (!) 338 lb 6.5 oz (153.5 kg)    Labs & Radiologic Studies     CBC  Recent Labs  07/29/16 1635 07/30/16 0522  WBC 11.9* 10.5  HGB 14.1 13.2  HCT 43.6 40.9  MCV 87.9 89.3  PLT 227 205   Basic Metabolic Panel  Recent Labs  07/29/16 1228 07/29/16 1635 07/30/16 0522  NA  --  139 137  K  --  4.6 4.4  CL  --  105 104  CO2  --  25 26  GLUCOSE  --  171* 235*  BUN  --  19 17  CREATININE  --  1.20 1.18  CALCIUM  --  9.1 8.8*  MG 1.5*  --   --    Liver Function Tests  Recent Labs  07/30/16 0522  AST 20  ALT 26  ALKPHOS 53  BILITOT 0.5  PROT 5.5*  ALBUMIN 3.1*   No results for input(s): LIPASE, AMYLASE in the last 72 hours. Cardiac Enzymes  Recent Labs  07/29/16 0115 07/29/16 0340 07/29/16 0636  TROPONINI <0.03 <0.03 <0.03   BNP Invalid input(s):  POCBNP D-Dimer No results for input(s): DDIMER in the last 72 hours. Hemoglobin A1C  Recent Labs  07/29/16 1220  HGBA1C 11.6*   Fasting Lipid Panel No results for input(s): CHOL, HDL, LDLCALC, TRIG, CHOLHDL, LDLDIRECT in the last 72 hours. Thyroid Function Tests No results for input(s): TSH, T4TOTAL, T3FREE, THYROIDAB in the last 72 hours.  Invalid input(s): FREET3  Dg Chest 2 View  Result Date: 07/28/2016 CLINICAL DATA:  Left-sided chest pain radiating to left shoulder. Shortness of breath for 3 weeks. EXAM: CHEST  2 VIEW COMPARISON:  08/15/2013 FINDINGS: The heart size and mediastinal contours are within normal limits. Both lungs are clear. The visualized skeletal structures are unremarkable. IMPRESSION: No active cardiopulmonary disease. Electronically Signed   By: Myles RosenthalJohn  Stahl M.D.   On: 07/28/2016 17:43    Disposition   Pt is being discharged home today in good condition.  Follow-up Plans & Appointments    Follow-up Information    Azalee CourseHao Meng, [place] Follow up on 08/16/2016.   Specialties:  Cardiology, Radiology Why:  @  8:30 post hospital Contact information: [address] [name] [address] [phone]        Egbert Garibaldi, NP. Go on 08/02/2016.   Why:  for post hospital and DM management Contact information: [facility] [address] [name] [address] [phone]          Discharge Instructions    AMB Referral to Cardiac Rehabilitation - Phase II    Complete by:  As directed   Diagnosis:  Coronary Stents   Amb Referral to Cardiac Rehabilitation    Complete by:  As directed   Diagnosis:  Coronary Stents   Diet - low sodium heart healthy    Complete by:  As directed   Discharge instructions    Complete by:  As directed   No driving for 48 hours. No lifting over 5 lbs for 1 week. No sexual activity for 1 week. You may return to work on 08/03/16. Keep procedure site clean & dry. If you notice increased pain,  swelling, bleeding or pus, call/return!  You may shower, but no soaking baths/hot tubs/pools for 1 week.   Patients taking Plavix, aspirin and Xarelto  should generally stay away from medicines like ibuprofen, Advil, Motrin, naproxen, and Aleve due to risk of stomach bleeding. You may take Tylenol as directed or talk to your primary doctor about alternatives.  Some studies suggest Prilosec/Omeprazole interacts with Plavix. We changed your Prilosec/Omeprazole to Protonix for less chance of interaction.   Increase activity slowly    Complete by:  As directed      Discharge Medications   Current Discharge Medication List    START taking these medications   Details  aspirin 81 MG chewable tablet Chew 1 tablet (81 mg total) by mouth daily. Qty: 81 tablet, Refills: 30    clopidogrel (PLAVIX) 75 MG tablet Take 1 tablet (75 mg total) by mouth daily with breakfast. Qty: 30 tablet, Refills: 0    pantoprazole (PROTONIX) 40 MG tablet Take 2 tablets (80 mg total) by mouth daily. Qty: 30 tablet, Refills: 3      CONTINUE these medications which have NOT CHANGED   Details  albuterol (PROAIR HFA) 108 (90 BASE) MCG/ACT inhaler Inhale 2 puffs into the lungs every 6 (six) hours as needed for wheezing or shortness of breath.     albuterol (PROVENTIL) (2.5 MG/3ML) 0.083% nebulizer solution Take 2.5 mg by nebulization every 4 (four) hours as needed for wheezing or shortness of breath.    ALPRAZolam (XANAX) 1 MG tablet Take 1-2 mg by mouth 4 (four) times daily. 2 mg every evening    amLODipine (NORVASC) 2.5 MG tablet Take 2.5 mg by mouth every morning.     atorvastatin (LIPITOR) 40 MG tablet Take 1 tablet (40 mg total) by mouth daily. Qty: 30 tablet, Refills: 6    beclomethasone (QVAR) 80 MCG/ACT inhaler Inhale 2 puffs into the lungs 2 (two) times daily as needed (for wheezing or shortness of breath).    buPROPion (WELLBUTRIN XL) 150 MG 24 hr tablet Take 150 mg by mouth 2 (two) times daily.       diphenhydrAMINE (BENADRYL) 25 mg capsule Take 25 mg by mouth every 6 (six) hours as needed for itching or allergies.    fenofibrate (TRICOR) 145 MG tablet Take 145 mg by mouth every morning.     fluconazole (DIFLUCAN) 100 MG tablet Take 100 mg by mouth every Monday.  Refills: 0   Associated Diagnoses: Type II diabetes mellitus,  uncontrolled (HCC)    fluticasone (FLONASE) 50 MCG/ACT nasal spray Place 2 sprays into both nostrils daily as needed for allergies or rhinitis.    glucose blood (ONETOUCH VERIO) test strip Use to test blood sugar 3 times daily as instructed. Dx: E10.9 Qty: 100 each, Refills: 5    HUMALOG KWIKPEN 100 UNIT/ML SOPN Inject 35-40 Units into the skin 3 (three) times daily with meals. As directed per sliding scale    Insulin Pen Needle 32G X 4 MM MISC Use to inject insulin 4 times daily. Dx: E10.9 Qty: 130 each, Refills: 5    LANTUS SOLOSTAR 100 UNIT/ML Solostar Pen Inject 70 Units into the skin 2 (two) times daily.     lisinopril (PRINIVIL,ZESTRIL) 5 MG tablet Take 5 mg by mouth daily.    LYRICA 50 MG capsule Take 50-100 mg by mouth See admin instructions. 100 mg every morning and 50 mg every evening Refills: 5   Associated Diagnoses: Type II diabetes mellitus, uncontrolled (HCC)    meclizine (ANTIVERT) 25 MG tablet Take 25 mg by mouth 3 (three) times daily as needed for dizziness. 25 mg every morning and 25 mg every evening.  May take an additional 25 mg during the day    metFORMIN (GLUCOPHAGE) 1000 MG tablet Take 1,000 mg by mouth 2 (two) times daily with a meal.      nitroGLYCERIN (NITROSTAT) 0.4 MG SL tablet Place 0.4 mg under the tongue every 5 (five) minutes as needed for chest pain.    oxyCODONE (ROXICODONE) 15 MG immediate release tablet Take 15 mg by mouth 5 (five) times daily.     promethazine (PHENERGAN) 25 MG tablet Take 25 mg by mouth every 6 (six) hours as needed for nausea.     Rivaroxaban (XARELTO) 20 MG TABS Take 1 tablet (20 mg total) by mouth  daily. Qty: 30 tablet, Refills: 1    tiZANidine (ZANAFLEX) 4 MG tablet Take 4 mg by mouth daily as needed for muscle spasms.  Refills: 0   Associated Diagnoses: Type II diabetes mellitus, uncontrolled (HCC)    VICTOZA 18 MG/3ML SOPN Inject 0.2 mLs (1.2 mg total) into the skin daily. Inject once daily at the same time Qty: 2 pen, Refills: 3      STOP taking these medications     diclofenac (VOLTAREN) 75 MG EC tablet      ibuprofen (ADVIL,MOTRIN) 200 MG tablet      omeprazole (PRILOSEC) 40 MG capsule             Outstanding Labs/Studies   Lipid panel, LFT, sleep study.   Duration of Discharge Encounter   Greater than 30 minutes including physician time.  Signed, Maximilian Tallo PA-C 07/30/2016, 1:01 PM

## 2016-07-30 NOTE — Progress Notes (Signed)
TR BAND REMOVAL  LOCATION:    right radial  DEFLATED PER PROTOCOL:    Yes.    TIME BAND OFF / DRESSING APPLIED:    01:45   SITE UPON ARRIVAL:    Level 0  SITE AFTER BAND REMOVAL:    Level 0  CIRCULATION SENSATION AND MOVEMENT:    Within Normal Limits   Yes.    COMMENTS:   Pt. tolerated well. TR band instructions given.

## 2016-07-30 NOTE — Progress Notes (Signed)
CARDIAC REHAB PHASE I   PRE:  Rate/Rhythm: 91 SR  BP:  Supine: 112/83  Sitting:   Standing:    SaO2: 97%RA  MODE:  Ambulation: 650 ft   POST:  Rate/Rhythm: 102 ST  BP:  Supine:   Sitting: 125/87  Standing:    SaO2: 99%RA [id number] Pt walked 650 ft independently. No CP. Had to sit in chair briefly at halfway point due to knee problems. Has had both knees operated on this year. Stated if he wears braces he can walk much farther. Education completed with pt and wife who voiced understanding. Discussed importance of plavix with stent. Reviewed NTG use, carb counting, risk factors, smoking cessation, heart healthy eating, and ex ed. Gave fake cigarette and smoking cessation handout. Pt stated he cannot go cold [place] and cannot afford patches. Encouraged him to call 1800quitnow to see if he can get free patches. Discussed CRP 2 and [facility]. Very sleepy during ed but wife there to re enforce ed.    Luetta Nuttingharlene Greenlee Ancheta, RN BSN  07/30/2016 10:17 AM

## 2016-07-30 NOTE — Progress Notes (Signed)
  Echocardiogram 2D Echocardiogram with Definity has been performed.  [name], [name] M 07/30/2016, 9:10 AM

## 2016-07-30 NOTE — Telephone Encounter (Signed)
REFILL 

## 2016-07-30 NOTE — Progress Notes (Signed)
Subjective:  S/P LCX OM PCI/BMS. No CP  Objective:  Temp:  [97.3 F (36.3 C)-98.2 F (36.8 C)] 97.4 F (36.3 C) (08/11 0732) Pulse Rate:  [82-97] 88 (08/11 0349) Resp:  [0-27] 17 (08/11 0400) BP: (88-136)/(51-87) 136/72 (08/11 0400) SpO2:  [94 %-100 %] 99 % (08/11 0400) Weight:  [338 lb 6.5 oz (153.5 kg)] 338 lb 6.5 oz (153.5 kg) (08/11 0349) Weight change: 6 lb 6.5 oz (2.906 kg)  Intake/Output from previous day: 08/10 0701 - 08/11 0700 In: 1281.7 [P.O.:480; I.V.:801.7] Out: -   Intake/Output from this shift: No intake/output data recorded.  Physical Exam: General appearance: alert and no distress Neck: no adenopathy, no carotid bruit, no JVD, supple, symmetrical, trachea midline and thyroid not enlarged, symmetric, no tenderness/mass/nodules Lungs: clear to auscultation bilaterally Heart: regular rate and rhythm, S1, S2 normal, no murmur, click, rub or gallop Extremities: extremities normal, atraumatic, no cyanosis or edema and Right wrist OK  Lab Results: Results for orders placed or performed during the hospital encounter of 07/28/16 (from the past 48 hour(s))  Basic metabolic panel     Status: Abnormal   Collection Time: 07/28/16  5:13 PM  Result Value Ref Range   Sodium 137 135 - 145 mmol/L   Potassium 4.9 3.5 - 5.1 mmol/L   Chloride 106 101 - 111 mmol/L   CO2 20 (L) 22 - 32 mmol/L   Glucose, Bld 376 (H) 65 - 99 mg/dL   BUN 17 6 - 20 mg/dL   Creatinine, Ser 1.17 0.61 - 1.24 mg/dL   Calcium 10.0 8.9 - 10.3 mg/dL   GFR calc non Af Amer >60 >60 mL/min   GFR calc Af Amer >60 >60 mL/min    Comment: (NOTE) The eGFR has been calculated using the CKD EPI equation. This calculation has not been validated in all clinical situations. eGFR's persistently <60 mL/min signify possible Chronic Kidney Disease.    Anion gap 11 5 - 15  CBC     Status: Abnormal   Collection Time: 07/28/16  5:13 PM  Result Value Ref Range   WBC 12.3 (H) 4.0 - 10.5 K/uL   RBC 5.39 4.22  - 5.81 MIL/uL   Hemoglobin 15.8 13.0 - 17.0 g/dL   HCT 47.2 39.0 - 52.0 %   MCV 87.6 78.0 - 100.0 fL   MCH 29.3 26.0 - 34.0 pg   MCHC 33.5 30.0 - 36.0 g/dL   RDW 12.7 11.5 - 15.5 %   Platelets 280 150 - 400 K/uL  I-stat troponin, ED     Status: None   Collection Time: 07/28/16  5:37 PM  Result Value Ref Range   Troponin i, poc 0.00 0.00 - 0.08 ng/mL   Comment 3            Comment: Due to the release kinetics of cTnI, a negative result within the first hours of the onset of symptoms does not rule out myocardial infarction with certainty. If myocardial infarction is still suspected, repeat the test at appropriate intervals.   Troponin I-serum (0, 3, 6 hours)     Status: None   Collection Time: 07/29/16  1:15 AM  Result Value Ref Range   Troponin I <0.03 <0.03 ng/mL  Glucose, capillary     Status: Abnormal   Collection Time: 07/29/16  3:07 AM  Result Value Ref Range   Glucose-Capillary 300 (H) 65 - 99 mg/dL  Troponin I-serum (0, 3, 6 hours)     Status: None  Collection Time: 07/29/16  3:40 AM  Result Value Ref Range   Troponin I <0.03 <0.03 ng/mL  Troponin I-serum (0, 3, 6 hours)     Status: None   Collection Time: 07/29/16  6:36 AM  Result Value Ref Range   Troponin I <0.03 <0.03 ng/mL  Glucose, capillary     Status: Abnormal   Collection Time: 07/29/16  8:53 AM  Result Value Ref Range   Glucose-Capillary 210 (H) 65 - 99 mg/dL  Glucose, capillary     Status: Abnormal   Collection Time: 07/29/16 11:32 AM  Result Value Ref Range   Glucose-Capillary 227 (H) 65 - 99 mg/dL   Comment 1 Notify RN   Magnesium     Status: Abnormal   Collection Time: 07/29/16 12:28 PM  Result Value Ref Range   Magnesium 1.5 (L) 1.7 - 2.4 mg/dL  POCT Activated clotting time     Status: None   Collection Time: 07/29/16  3:38 PM  Result Value Ref Range   Activated Clotting Time 483 seconds  Basic metabolic panel     Status: Abnormal   Collection Time: 07/29/16  4:35 PM  Result Value Ref Range    Sodium 139 135 - 145 mmol/L   Potassium 4.6 3.5 - 5.1 mmol/L   Chloride 105 101 - 111 mmol/L   CO2 25 22 - 32 mmol/L   Glucose, Bld 171 (H) 65 - 99 mg/dL   BUN 19 6 - 20 mg/dL   Creatinine, Ser 1.20 0.61 - 1.24 mg/dL   Calcium 9.1 8.9 - 10.3 mg/dL   GFR calc non Af Amer >60 >60 mL/min   GFR calc Af Amer >60 >60 mL/min    Comment: (NOTE) The eGFR has been calculated using the CKD EPI equation. This calculation has not been validated in all clinical situations. eGFR's persistently <60 mL/min signify possible Chronic Kidney Disease.    Anion gap 9 5 - 15  Protime-INR     Status: Abnormal   Collection Time: 07/29/16  4:35 PM  Result Value Ref Range   Prothrombin Time 54.2 (H) 11.4 - 15.2 seconds   INR 5.85 (HH)     Comment: REPEATED TO VERIFY CRITICAL RESULT CALLED TO, READ BACK BY AND VERIFIED WITH: J.ELDRETT,RN 07/29/16 _0  BY V.WILKINS   CBC     Status: Abnormal   Collection Time: 07/29/16  4:35 PM  Result Value Ref Range   WBC 11.9 (H) 4.0 - 10.5 K/uL   RBC 4.96 4.22 - 5.81 MIL/uL   Hemoglobin 14.1 13.0 - 17.0 g/dL   HCT 43.6 39.0 - 52.0 %   MCV 87.9 78.0 - 100.0 fL   MCH 28.4 26.0 - 34.0 pg   MCHC 32.3 30.0 - 36.0 g/dL   RDW 13.0 11.5 - 15.5 %   Platelets 227 150 - 400 K/uL  Glucose, capillary     Status: Abnormal   Collection Time: 07/29/16  4:38 PM  Result Value Ref Range   Glucose-Capillary 158 (H) 65 - 99 mg/dL  Glucose, capillary     Status: Abnormal   Collection Time: 07/29/16  8:05 PM  Result Value Ref Range   Glucose-Capillary 271 (H) 65 - 99 mg/dL  Comprehensive metabolic panel     Status: Abnormal   Collection Time: 07/30/16  5:22 AM  Result Value Ref Range   Sodium 137 135 - 145 mmol/L   Potassium 4.4 3.5 - 5.1 mmol/L   Chloride 104 101 - 111 mmol/L   CO2 26 22 -  32 mmol/L   Glucose, Bld 235 (H) 65 - 99 mg/dL   BUN 17 6 - 20 mg/dL   Creatinine, Ser 1.18 0.61 - 1.24 mg/dL   Calcium 8.8 (L) 8.9 - 10.3 mg/dL   Total Protein 5.5 (L) 6.5 - 8.1 g/dL    Albumin 3.1 (L) 3.5 - 5.0 g/dL   AST 20 15 - 41 U/L   ALT 26 17 - 63 U/L   Alkaline Phosphatase 53 38 - 126 U/L   Total Bilirubin 0.5 0.3 - 1.2 mg/dL   GFR calc non Af Amer >60 >60 mL/min   GFR calc Af Amer >60 >60 mL/min    Comment: (NOTE) The eGFR has been calculated using the CKD EPI equation. This calculation has not been validated in all clinical situations. eGFR's persistently <60 mL/min signify possible Chronic Kidney Disease.    Anion gap 7 5 - 15  CBC     Status: None   Collection Time: 07/30/16  5:22 AM  Result Value Ref Range   WBC 10.5 4.0 - 10.5 K/uL   RBC 4.58 4.22 - 5.81 MIL/uL   Hemoglobin 13.2 13.0 - 17.0 g/dL   HCT 40.9 39.0 - 52.0 %   MCV 89.3 78.0 - 100.0 fL   MCH 28.8 26.0 - 34.0 pg   MCHC 32.3 30.0 - 36.0 g/dL   RDW 13.1 11.5 - 15.5 %   Platelets 205 150 - 400 K/uL  Glucose, capillary     Status: Abnormal   Collection Time: 07/30/16  6:04 AM  Result Value Ref Range   Glucose-Capillary 260 (H) 65 - 99 mg/dL    Imaging: Imaging results have been reviewed  Tele- NSR 90s    Assessment/Plan:   1. Principal Problem: 2.   Chest pain, moderate coronary artery risk 3. Active Problems: 4.   Type 1 diabetes mellitus ([place]) 5.   Essential hypertension 6.   Hyperglycemia 7.   Unstable angina ([place]) 8.   Time Spent Directly with Patient:  20 minutes  Length of Stay:  LOS: 1 day   S/P BMS distal LCX. No CP. Enz neg. Back on Xarelto. Exam benign. Labs OK. Needs OP sleep study. 2D pending this AM. OK for DC home after that on ASA 80, Plavix 54m and Xarelto d\for 30 days then we can drop the plavix.  BQuay Burow8/10/2016, 7:57 AM

## 2016-07-30 NOTE — Progress Notes (Signed)
Pt. placed on BiPAP at I-16/E-10 and 8 L Oxygen due to Apnea episodes with desats down to 87%, has not been using CPAP at home prior to having Stent recently placed, on observation appears to have a mixed (Obstructive/Central) Apnea, RN made aware, would recommend another sleep study to determine best therapy for pt., no use of Oxygen at per wife remaining at bedside.

## 2016-08-16 ENCOUNTER — Ambulatory Visit: Payer: Medicare Other | Admitting: Physician Assistant

## 2016-08-27 ENCOUNTER — Ambulatory Visit (INDEPENDENT_AMBULATORY_CARE_PROVIDER_SITE_OTHER): Payer: Medicare Other | Admitting: Physician Assistant

## 2016-08-27 ENCOUNTER — Encounter: Payer: Self-pay | Admitting: Physician Assistant

## 2016-08-27 VITALS — BP 120/82 | HR 100 | Ht 77.0 in | Wt 333.1 lb

## 2016-08-27 DIAGNOSIS — E119 Type 2 diabetes mellitus without complications: Secondary | ICD-10-CM | POA: Diagnosis not present

## 2016-08-27 DIAGNOSIS — E669 Obesity, unspecified: Secondary | ICD-10-CM

## 2016-08-27 DIAGNOSIS — I2782 Chronic pulmonary embolism: Secondary | ICD-10-CM

## 2016-08-27 DIAGNOSIS — I2510 Atherosclerotic heart disease of native coronary artery without angina pectoris: Secondary | ICD-10-CM | POA: Diagnosis not present

## 2016-08-27 DIAGNOSIS — G4733 Obstructive sleep apnea (adult) (pediatric): Secondary | ICD-10-CM

## 2016-08-27 DIAGNOSIS — E785 Hyperlipidemia, unspecified: Secondary | ICD-10-CM

## 2016-08-27 DIAGNOSIS — I10 Essential (primary) hypertension: Secondary | ICD-10-CM

## 2016-08-27 DIAGNOSIS — E1169 Type 2 diabetes mellitus with other specified complication: Secondary | ICD-10-CM

## 2016-08-27 NOTE — Progress Notes (Addendum)
Cardiology Office Note    Date:  08/27/2016   ID:  Jayceon Troy Wasko, DOB [birth year], MRN [id number]  PCP:  Egbert Garibaldi, NP  Cardiologist:  Dr. [facility] Salvia  Chief Complaint  Patient presents with  . Hospitalization Follow-up    seen for Dr. [facility] Salvia    History of Present Illness:  Arlynn Mcdermid Morga is a 45 y.o. male with PMH of HTN, HLD, DM, OSA on CPAP, PE (2014) on Xarelto, h/o MI after cocaine use in 1996?Marland Kitchen He was recently admitted on 07/29/2016 for chest pain radiating to left shoulder and left arm for 2-3 weeks. Initial EKG without any acute changes. His hemoglobin A1c was 11.6 indicating uncontrolled diabetes. Given concerning symptom, it was recommended for him to undergo cardiac catheterization. He underwent planned procedure on 07/29/2016 revealing an 80% distal left circumflex lesion successfully treated with Vision 2.79 x 50 mm BMS. Per cath report, he was started on aspirin, Plavix and resumed his Xarelto. The plan was to continue Plavix for one month after which the Plavix will be discontinued. Echocardiogram obtained on 07/30/2016 which showed EF 55-60%, grade 1 diastolic dysfunction, mildly dilated aortic root, mild LVH. Per Dr. Allyson Sabal, it was also recommended for him to have outpatient sleep study.  He presents today for cardiac follow-up. According to the patient, he had a sleep study morning 2 years ago, he does have a sleep apnea machine at home, however does not have the tubing and has not started on it. I will refer him to Dr. Tresa Endo for further management of obstructive sleep apnea. He continued to smoke intermittently, I have advised him to stop smoking given his recently diagnosed CAD. I also advised him to continue to follow-up with with his PCP regarding uncontrolled diabetes with recent hemoglobin A1c of 11.6. Since the recent stent, he continued to have intermittent chest discomfort which he described as a substernal chest pain worse with emotional stress and also  exertion. Given recent bare metal stent placement, we did obtain an EKG in the office which did not reveal any significant ischemic changes. We will obtain outpatient 2 day Lexiscan study to rule out any risk of stent restenosis. He has otherwise been compliant on triple therapy. Technically, he can come off of Plavix in 2 days since he is last stent placement was August 10. However given the persistent chest discomfort, we will obtain outpatient Myoview first, if Myoview come back negative, he can come off Plavix. We will also obtain a fasting lipid panel since he has not had one recently, previous lipid panel in 2016 shows triglyceride 240, elevated LDL 120, normal total cholesterol 174. He did drink Gatorade this morning, therefore we will obtain the fasting lipid panel as outpatient at [facility] which is closer to him. Given the need for triple therapy to meantime, we will obtain a CBC to make sure there is no alcohol bleeding. He has not noticed any significant bleeding since left the hospital.    Past Medical History:  Diagnosis Date  . Anxiety   . Arthritis    "knees, shoulders, hips, ankles" (07/29/2016)  . Asthma   . CAD (coronary artery disease)   . Chronic bronchitis (HCC)   . COPD (chronic obstructive pulmonary disease) (HCC)   . Depression   . GERD (gastroesophageal reflux disease)   . Headache    "about 3 times/month" (07/29/2016)  . Hyperlipidemia   . Hypertension   . Myocardial infarction (HCC) 1996   "light one"  .  PE (pulmonary embolism) 04/2013  . Pneumonia "several times"  . Type II diabetes mellitus (HCC)     Past Surgical History:  Procedure Laterality Date  . CARDIAC CATHETERIZATION  2006  . CARDIAC CATHETERIZATION  1996   "@ [facility]; when I had my heart attack"  . CARDIAC CATHETERIZATION N/A 07/29/2016   Procedure: Left Heart Cath and Coronary Angiography;  Surgeon: Runell Gess, MD;  Location: [facility] INVASIVE CV LAB;  Service: Cardiovascular;  Laterality:  N/A;  . CARDIAC CATHETERIZATION N/A 07/29/2016   Procedure: Coronary Stent Intervention;  Surgeon: Runell Gess, MD;  Location: [facility] INVASIVE CV LAB;  Service: Cardiovascular;  Laterality: N/A;  . CARPAL TUNNEL RELEASE Bilateral   . CORONARY ANGIOPLASTY WITH STENT PLACEMENT  07/29/2016  . KNEE ARTHROSCOPY Bilateral    "2 on left; 1 on the right"  . SHOULDER OPEN ROTATOR CUFF REPAIR Bilateral     Current Medications: Outpatient Medications Prior to Visit  Medication Sig Dispense Refill  . albuterol (PROAIR HFA) 108 (90 BASE) MCG/ACT inhaler Inhale 2 puffs into the lungs every 6 (six) hours as needed for wheezing or shortness of breath.     Marland Kitchen albuterol (PROVENTIL) (2.5 MG/3ML) 0.083% nebulizer solution Take 2.5 mg by nebulization every 4 (four) hours as needed for wheezing or shortness of breath.    . ALPRAZolam (XANAX) 1 MG tablet Take 1-2 mg by mouth 4 (four) times daily. 2 mg every evening    . amLODipine (NORVASC) 2.5 MG tablet Take 2.5 mg by mouth every morning.     Marland Kitchen aspirin 81 MG chewable tablet Chew 1 tablet (81 mg total) by mouth daily. 81 tablet 30  . atorvastatin (LIPITOR) 40 MG tablet Take 1 tablet (40 mg total) by mouth daily. (Patient taking differently: Take 40 mg by mouth daily at 6 PM. ) 30 tablet 6  . beclomethasone (QVAR) 80 MCG/ACT inhaler Inhale 2 puffs into the lungs 2 (two) times daily as needed (for wheezing or shortness of breath).    . clopidogrel (PLAVIX) 75 MG tablet TAKE 1 TABLET BY MOUTH DAILY WITH BREAKFAST 90 tablet 0  . diphenhydrAMINE (BENADRYL) 25 mg capsule Take 25 mg by mouth every 6 (six) hours as needed for itching or allergies.    . fenofibrate (TRICOR) 145 MG tablet Take 145 mg by mouth every morning.     . fluconazole (DIFLUCAN) 100 MG tablet Take 100 mg by mouth every Monday.   0  . fluticasone (FLONASE) 50 MCG/ACT nasal spray Place 2 sprays into both nostrils daily as needed for allergies or rhinitis.    Marland Kitchen glucose blood (ONETOUCH VERIO) test strip  Use to test blood sugar 3 times daily as instructed. Dx: E10.9 100 each 5  . HUMALOG KWIKPEN 100 UNIT/ML SOPN Inject 35-40 Units into the skin 3 (three) times daily with meals. As directed per sliding scale    . Insulin Pen Needle 32G X 4 MM MISC Use to inject insulin 4 times daily. Dx: E10.9 130 each 5  . LANTUS SOLOSTAR 100 UNIT/ML Solostar Pen Inject 70 Units into the skin 2 (two) times daily.     Marland Kitchen lisinopril (PRINIVIL,ZESTRIL) 5 MG tablet Take 5 mg by mouth daily.    Marland Kitchen LYRICA 50 MG capsule Take 50-100 mg by mouth See admin instructions. 100 mg every morning and 50 mg every evening  5  . meclizine (ANTIVERT) 25 MG tablet Take 25 mg by mouth 3 (three) times daily as needed for dizziness. 25 mg every morning  and 25 mg every evening.  May take an additional 25 mg during the day    . metFORMIN (GLUCOPHAGE) 1000 MG tablet Take 1,000 mg by mouth 2 (two) times daily with a meal.      . nitroGLYCERIN (NITROSTAT) 0.4 MG SL tablet Place 0.4 mg under the tongue every 5 (five) minutes as needed for chest pain.    Marland Kitchen oxyCODONE (ROXICODONE) 15 MG immediate release tablet Take 15 mg by mouth 5 (five) times daily.     . pantoprazole (PROTONIX) 40 MG tablet TAKE 2 TABLETS(80 MG) BY MOUTH DAILY 180 tablet 0  . promethazine (PHENERGAN) 25 MG tablet Take 25 mg by mouth every 6 (six) hours as needed for nausea.     . Rivaroxaban (XARELTO) 20 MG TABS Take 1 tablet (20 mg total) by mouth daily. 30 tablet 1  . tiZANidine (ZANAFLEX) 4 MG tablet Take 4 mg by mouth daily as needed for muscle spasms.   0  . VICTOZA 18 MG/3ML SOPN Inject 0.2 mLs (1.2 mg total) into the skin daily. Inject once daily at the same time 2 pen 3  . buPROPion (WELLBUTRIN XL) 150 MG 24 hr tablet Take 150 mg by mouth 2 (two) times daily.       No facility-administered medications prior to visit.      Allergies:   Sulfa antibiotics   Social History   Social History  . Marital status: Married    Spouse name: N/A  . Number of children: N/A  .  Years of education: N/A   Occupational History  . disabled  Unemployed   Social History Main Topics  . Smoking status: Current Every Day Smoker    Packs/day: 0.50    Years: 34.00    Types: Cigarettes  . Smokeless tobacco: Current User    Types: Snuff, Chew     Comment: 8/910/2017 "3 ppd before 2015"  . Alcohol use Yes     Comment: 07/29/2016 "quit drinking in 2004"  . Drug use: No  . Sexual activity: Yes   Other Topics Concern  . None   Social History Narrative  . None     Family History:  The patient's family history includes Hypertension in his brother and father.   ROS:   Please see the history of present illness.    ROS All other systems reviewed and are negative.   PHYSICAL EXAM:   VS:  BP 120/82 (BP Location: Left Arm, Patient Position: Sitting, Cuff Size: Large)   Pulse 100   Ht 6\' 5"  (1.956 m)   Wt (!) 333 lb 2 oz (151.1 kg)   BMI 39.50 kg/m    GEN: Well nourished, well developed, in no acute distress  HEENT: normal  Neck: no JVD, carotid bruits, or masses Cardiac: RRR; no murmurs, rubs, or gallops,no edema  Respiratory:  clear to auscultation bilaterally, normal work of breathing GI: soft, nontender, nondistended, + BS MS: no deformity or atrophy  Skin: warm and dry, no rash Neuro:  Alert and Oriented x 3, Strength and sensation are intact Psych: euthymic mood, full affect  Wt Readings from Last 3 Encounters:  08/27/16 (!) 333 lb 2 oz (151.1 kg)  07/30/16 (!) 338 lb 6.5 oz (153.5 kg)  07/02/16 (!) 320 lb (145.2 kg)      Studies/Labs Reviewed:   EKG:  EKG is ordered today.  The ekg ordered today demonstrates NSR without significant change  Recent Labs: 07/29/2016: Magnesium 1.5 07/30/2016: ALT 26; BUN 17; Creatinine, Ser  1.18; Hemoglobin 13.2; Platelets 205; Potassium 4.4; Sodium 137   Lipid Panel    Component Value Date/Time   CHOL 174 05/28/2015 0951   TRIG 240.0 (H) 05/28/2015 0951   HDL 23.90 (L) 05/28/2015 0951   CHOLHDL 7 05/28/2015  0951   VLDL 48.0 (H) 05/28/2015 0951   LDLCALC 170 01/06/2015   LDLDIRECT 120.0 05/28/2015 0951    Additional studies/ records that were reviewed today include:   Cath 07/29/2016 Conclusion     Dist Cx lesion, 80 %stenosed.  Post intervention, there is a 0% residual stenosis.  A stent was successfully placed. Stent Vision Rx 2.75x15. Bare-metal coronary artery stent    Echo 07/30/2016 LV EF: 55% -   60%  ------------------------------------------------------------------- Indications:      CAD of native vessels 414.01.  ------------------------------------------------------------------- History:   PMH:  Dist Cx lesion, 80% stenosed; no residual stenosis post intervention.  Obstructive Sleep Apnea. Right Pulmonary Embolism.  PMH:  Substance Abuse.  Risk factors:  Hypertension. Diabetes mellitus. Dyslipidemia.  ------------------------------------------------------------------- Study Conclusions  - Left ventricle: The cavity size was normal. Wall thickness was   increased in a pattern of mild LVH. Systolic function was normal.   The estimated ejection fraction was in the range of 55% to 60%.   Wall motion was normal; there were no regional wall motion   abnormalities. Doppler parameters are consistent with abnormal   left ventricular relaxation (grade 1 diastolic dysfunction). - Aortic root: The aortic root was mildly dilated.  Impressions:  - Technically difficult; definity used; normal LV systolic   function; grade 1 diastolic dysfunction; indeterminant filling   pressure; fusion of left and right aortic cusps; no AS or AI by   doppler; mildly dilated aortic root.   ASSESSMENT:    1. Coronary artery disease involving native coronary artery of native heart without angina pectoris   2. Essential hypertension   3. Hyperlipidemia   4. Diabetes mellitus type 2 in obese (HCC)   5. OSA (obstructive sleep apnea)   6. Other chronic pulmonary embolism without  acute cor pulmonale (HCC)      PLAN:  In order of problems listed above:  1. Intermittent chest pain:  - Continue to have intermittent chest discomfort after cath, worse with exertion but also worse with emotional stress. Given the fact that he received a bare metal stent, we will obtain outpatient stress test which will likely be 2 days given his body habitus.  2. CAD: Status post a recent bare-metal stent, currently on triple therapy including aspirin, Plavix and Xarelto. The plan was originally stop Plavix after 1 month, given intermittent chest discomfort, I will continue on aspirin and Plavix and Coumadin for now, I did order 2 day stress test, however if the stress test does come back normal, he can stop the Plavix. We'll check CBC as patient is on triple therapy.  3. HTN: Well controlled on current medication.  4. HLD: Currently on Lipitor and fenofibrate. Previous lipid panel in 2016 shows triglyceride 240, elevated LDL 120, normal total cholesterol 174. Obtain repeat fasting lipid panel and LFTs  5. DM II: Current insulin hemoglobin A1c was not controlled and was greater than 11 during the recent hospitalization, and advised him to continue to follow-up with his internal masses Dr. Karie Georges and she is diabetes.  6. OSA: He does have sleep apnea machine at home, but no tubing. He has not started using this even though his sleep study was more than 2 years ago. We'll refer  him to Dr. Tresa EndoKelly for further management of obstructive sleep apnea.  7. H/o PE on Xarelto: no sign of PE, patient compliant with Xarelto  Medication Adjustments/Labs and Tests Ordered: Current medicines are reviewed at length with the patient today.  Concerns regarding medicines are outlined above.  Medication changes, Labs and Tests ordered today are listed in the Patient Instructions below. Patient Instructions  [facility]iscan at [facility]  Lab work ( lipid panel,cbc )  [facility]  Your physician recommends that you schedule a follow-up appointment in: 1 month with Dr.Marshall     Signed, Azalee CourseHao Christyl Osentoski, PA  08/27/2016 11:01 PM    [facility] [address], [place], [place]  [address] Phone: [phone]; Fax: [phone]

## 2016-08-27 NOTE — Patient Instructions (Signed)
Schedule [facility]  Lab work ( lipid panel,cbc )  [facility]  Your physician recommends that you schedule a follow-up appointment in: 1 month with Dr.

## 2016-09-03 ENCOUNTER — Other Ambulatory Visit: Payer: Self-pay

## 2016-09-03 DIAGNOSIS — R072 Precordial pain: Secondary | ICD-10-CM

## 2016-09-06 ENCOUNTER — Encounter (HOSPITAL_COMMUNITY): Payer: Medicare Other

## 2016-09-06 ENCOUNTER — Inpatient Hospital Stay (HOSPITAL_COMMUNITY): Admission: RE | Admit: 2016-09-06 | Payer: Medicare Other | Source: Ambulatory Visit

## 2016-09-07 ENCOUNTER — Encounter (HOSPITAL_COMMUNITY): Payer: Medicare Other

## 2016-09-13 ENCOUNTER — Encounter (HOSPITAL_COMMUNITY): Admission: RE | Admit: 2016-09-13 | Payer: Medicare Other | Source: Ambulatory Visit

## 2016-09-14 ENCOUNTER — Other Ambulatory Visit: Payer: Self-pay | Admitting: Cardiovascular Disease

## 2016-09-14 NOTE — Telephone Encounter (Signed)
Review for refill. 

## 2016-09-15 ENCOUNTER — Other Ambulatory Visit: Payer: Self-pay | Admitting: Physician Assistant

## 2016-09-15 ENCOUNTER — Telehealth: Payer: Self-pay | Admitting: Cardiovascular Disease

## 2016-09-15 NOTE — Telephone Encounter (Signed)
Please advise 

## 2016-09-15 NOTE — Telephone Encounter (Signed)
New message     *STAT* If patient is at the pharmacy, call can be transferred to refill team.   1. Which medications need to be refilled? (please list name of each medication and dose if known) nitro 0.4 mg  2. Which pharmacy/location (including street and city if local pharmacy) is medication to be sent to? [facility] in [place]   3. Do they need a 30 day or 90 day supply? 30 days supply

## 2016-09-15 NOTE — Telephone Encounter (Signed)
Please review for refill. Thanks!  

## 2016-09-16 ENCOUNTER — Telehealth: Payer: Self-pay | Admitting: Cardiovascular Disease

## 2016-09-16 MED ORDER — NITROGLYCERIN 0.4 MG SL SUBL: 0.4000 mg | SUBLINGUAL_TABLET | SUBLINGUAL | 12 refills | Status: DC | PRN

## 2016-09-16 MED ORDER — CLOPIDOGREL BISULFATE 75 MG PO TABS: 75.0000 mg | ORAL_TABLET | Freq: Every day | ORAL | 3 refills | Status: DC

## 2016-09-16 NOTE — Telephone Encounter (Signed)
New Message   *STAT* If patient is at the pharmacy, call can be transferred to refill team.   1. Which medications need to be refilled? (please list name of each medication and dose if known) Plavix 75 mg tablet once daily  2. Which pharmacy/location (including street and city if local pharmacy) is medication to be sent to?  [facility] [phone], [address], [place], [place]  3. Do they need a 30 day or 90 day supply? 30 days

## 2016-09-16 NOTE — Telephone Encounter (Signed)
Refill sent to the pharmacy electronically. Left message for pt to call if he is having problems

## 2016-09-16 NOTE — Telephone Encounter (Signed)
Refill sent to the pharmacy electronically.  

## 2016-09-28 NOTE — Progress Notes (Signed)
Cardiology Office Note   Date:  09/29/2016   ID:  Gwynneth Albrighterry W Hon, DOB 1971-05-12, MRN [id number]  PCP:  Egbert GaribaldiMillsaps, KIMBERLY M, NP  Cardiologist:   Chilton Siiffany [place], MD   Chief Complaint  Patient presents with  . Chest Pain    pt states he had chest pain on yesterday feeling like an elephant sitting on hos chest   . Shortness of Breath    all the time      History of Present Illness: Joseph [name] is a 45 y.o. male with CAD s/p LCx PCI 07/2016,  HTN, HL, DM Type II (A1c 7.1 07/2015), pror PE, and OSA who presents for follow up.  He was was first seen 07/2015 for pre-operative clearance prior to right knee and left hip surgery.  At that time he was feeling well but was unable to exercise due to orthopedic limitations, so [facility] that revealed LVEF 50% and a small area in the basal inferolateral region thought to be either soft tissue attenuation vs prior infarct with mild peri-infarct ischemia.  He was seen in the ED 07/2016 with unstable angina.  He underwent LHC 07/29/16 and was found to have an 80% LCx lesion that was successfully stented.  He followed up with Azalee CourseHao Meng, PA-C on 09/06/16, At which time he continued to report episodes of chest pain. He was referred for stress test but has not yet completed this. He reports that his son has kidney stones and he and his wife have been busy taking him to his appointments and procedures. He continues to have episodes of exertional chest discomfort. He also notes that when he is angry or upset. He responds after 2 or 3 nitroglycerin tablets. The pain is substernal and on the left side of his chest. It does not radiate. There is associated shortness of breath and nausea, though he is nauseous at baseline. He also endorses diaphoresis, though he states that this is a chronic issue as well.  When he has chest pain he feels as though he can't take a deep breath.  He continues to smoke half a pack of cigarettes daily.  He is not interested in time to quit again at this time. He previously used nitroglycerin patches without success. He has been on Wellbutrin for years.   Past Medical History:  Diagnosis Date  . Anxiety   . Arthritis    "knees, shoulders, hips, ankles" (07/29/2016)  . Asthma   . CAD (coronary artery disease)   . Chronic bronchitis (HCC)   . COPD (chronic obstructive pulmonary disease) (HCC)   . Depression   . GERD (gastroesophageal reflux disease)   . Headache    "about 3 times/month" (07/29/2016)  . Hyperlipidemia   . Hypertension   . Myocardial infarction 1996   "light one"  . PE (pulmonary embolism) 04/2013  . Pneumonia "several times"  . Type II diabetes mellitus (HCC)     Past Surgical History:  Procedure Laterality Date  . CARDIAC CATHETERIZATION  2006  . CARDIAC CATHETERIZATION  1996   "@ [facility]; when I had my heart attack"  . CARDIAC CATHETERIZATION N/A 07/29/2016   Procedure: Left Heart Cath and Coronary Angiography;  Surgeon: Runell GessJonathan J Berry, MD;  [facility];  Service: Cardiovascular;  Laterality: N/A;  . CARDIAC CATHETERIZATION N/A 07/29/2016   Procedure: Coronary Stent Intervention;  Surgeon: Runell GessJonathan J Berry, MD;  Location: [facility] INVASIVE CV LAB;  Service: Cardiovascular;  Laterality:  N/A;  . CARPAL TUNNEL RELEASE Bilateral   . CORONARY ANGIOPLASTY WITH STENT PLACEMENT  07/29/2016  . KNEE ARTHROSCOPY Bilateral    "2 on left; 1 on the right"  . SHOULDER OPEN ROTATOR CUFF REPAIR Bilateral      Current Outpatient Prescriptions  Medication Sig Dispense Refill  . albuterol (PROAIR HFA) 108 (90 BASE) MCG/ACT inhaler Inhale 2 puffs into the lungs every 6 (six) hours as needed for wheezing or shortness of breath.     Marland Kitchen albuterol (PROVENTIL) (2.5 MG/3ML) 0.083% nebulizer solution Take 2.5 mg by nebulization every 4 (four) hours as needed for wheezing or shortness of breath.    . ALPRAZolam (XANAX) 1 MG tablet Take 1-2 mg by mouth 4 (four) times daily. 2 mg  every evening    . amLODipine (NORVASC) 2.5 MG tablet Take 2.5 mg by mouth every morning.     Marland Kitchen aspirin 81 MG chewable tablet Chew 1 tablet (81 mg total) by mouth daily. 81 tablet 30  . atorvastatin (LIPITOR) 40 MG tablet Take 1 tablet (40 mg total) by mouth daily. (Patient taking differently: Take 40 mg by mouth daily at 6 PM. ) 30 tablet 6  . beclomethasone (QVAR) 80 MCG/ACT inhaler Inhale 2 puffs into the lungs 2 (two) times daily as needed (for wheezing or shortness of breath).    Marland Kitchen buPROPion (WELLBUTRIN SR) 150 MG 12 hr tablet Take 1 tablet by mouth 2 (two) times daily.  0  . clopidogrel (PLAVIX) 75 MG tablet Take 1 tablet (75 mg total) by mouth daily with breakfast. 90 tablet 3  . diphenhydrAMINE (BENADRYL) 25 mg capsule Take 25 mg by mouth every 6 (six) hours as needed for itching or allergies.    . fenofibrate (TRICOR) 145 MG tablet Take 145 mg by mouth every morning.     . fluconazole (DIFLUCAN) 100 MG tablet Take 100 mg by mouth every Monday.   0  . fluticasone (FLONASE) 50 MCG/ACT nasal spray Place 2 sprays into both nostrils daily as needed for allergies or rhinitis.    Marland Kitchen glucose blood (ONETOUCH VERIO) test strip Use to test blood sugar 3 times daily as instructed. Dx: E10.9 100 each 5  . HUMALOG KWIKPEN 100 UNIT/ML SOPN Inject 35-40 Units into the skin 3 (three) times daily with meals. As directed per sliding scale    . Insulin Pen Needle 32G X 4 MM MISC Use to inject insulin 4 times daily. Dx: E10.9 130 each 5  . LANTUS SOLOSTAR 100 UNIT/ML Solostar Pen Inject 70 Units into the skin 2 (two) times daily.     Marland Kitchen lisinopril (PRINIVIL,ZESTRIL) 5 MG tablet Take 5 mg by mouth daily.    Marland Kitchen LYRICA 50 MG capsule Take 50-100 mg by mouth See admin instructions. 100 mg every morning and 50 mg every evening  5  . meclizine (ANTIVERT) 25 MG tablet Take 25 mg by mouth 3 (three) times daily as needed for dizziness. 25 mg every morning and 25 mg every evening.  May take an additional 25 mg during the day     . metFORMIN (GLUCOPHAGE) 1000 MG tablet Take 1,000 mg by mouth 2 (two) times daily with a meal.      . nitroGLYCERIN (NITROSTAT) 0.4 MG SL tablet Place 1 tablet (0.4 mg total) under the tongue every 5 (five) minutes as needed for chest pain. 25 tablet 12  . omeprazole (PRILOSEC) 40 MG capsule Take 1 capsule by mouth daily.  6  . oxyCODONE (ROXICODONE) 15 MG  immediate release tablet Take 15 mg by mouth 5 (five) times daily.     . pantoprazole (PROTONIX) 40 MG tablet TAKE 2 TABLETS(80 MG) BY MOUTH DAILY 180 tablet 0  . promethazine (PHENERGAN) 25 MG tablet Take 25 mg by mouth every 6 (six) hours as needed for nausea.     . Rivaroxaban (XARELTO) 20 MG TABS Take 1 tablet (20 mg total) by mouth daily. 30 tablet 1  . tiZANidine (ZANAFLEX) 4 MG tablet Take 4 mg by mouth daily as needed for muscle spasms.   0  . VICTOZA 18 MG/3ML SOPN Inject 0.2 mLs (1.2 mg total) into the skin daily. Inject once daily at the same time 2 pen 3   No current facility-administered medications for this visit.     Allergies:   Sulfa antibiotics    Social History:  The patient  reports that he has been smoking Cigarettes.  He has a 17.00 pack-year smoking history. His smokeless tobacco use includes Snuff and Chew. He reports that he drinks alcohol. He reports that he does not use drugs.   Family History:  The patient's family history includes Hypertension in his brother and father.    ROS:  Please see the history of present illness.   Otherwise, review of systems are positive for none.   All other systems are reviewed and negative.    PHYSICAL EXAM: VS:  BP 129/83   Pulse 99   Ht 6\' 5"  (1.956 m)   Wt (!) 332 lb (150.6 kg)   BMI 39.37 kg/m  , BMI Body mass index is 39.37 kg/m. GENERAL:  Well appearing.  No acute distress HEENT:  Pupils equal round and reactive, fundi not visualized, oral mucosa unremarkable NECK:  No jugular venous distention, waveform within normal limits, carotid upstroke brisk and  symmetric, no bruits LYMPHATICS:  No cervical adenopathy LUNGS:  Clear to auscultation bilaterally HEART:  RRR.  PMI not displaced or sustained,S1 and S2 within normal limits, no S3, no S4, no clicks, no rubs, no murmurs ABD:  Flat, positive bowel sounds normal in frequency in pitch, no bruits, no rebound, no guarding, no midline pulsatile mass, no hepatomegaly, no splenomegaly EXT:  2 plus pulses throughout, no edema, no cyanosis no clubbing SKIN:  No rashes no nodules NEURO:  Cranial nerves II through XII grossly intact, motor grossly intact throughout PSYCH:  Cognitively intact, oriented to person place and time   EKG:  EKG is ordered today. The ekg ordered 08/27/16 demonstrates sinus tachycardia at 110 bpm.  Absent R wave progression.  Non-specific ST elevation V2.  Prior inferior MI. 09/29/16: Sinus rhythm. Rate 99 bpm. Left axis deviation. Prior inferior infarct. Prior anterior infarct.  Echo 07/30/16: Study Conclusions  - Left ventricle: The cavity size was normal. Wall thickness was   increased in a pattern of mild LVH. Systolic function was normal.   The estimated ejection fraction was in the range of 55% to 60%.   Wall motion was normal; there were no regional wall motion   abnormalities. Doppler parameters are consistent with abnormal   left ventricular relaxation (grade 1 diastolic dysfunction). - Aortic root: The aortic root was mildly dilated.  Impressions:  - Technically difficult; definity used; normal LV systolic   function; grade 1 diastolic dysfunction; indeterminant filling   pressure; fusion of left and right aortic cusps; no AS or AI by   doppler; mildly dilated aortic root.   LHC 07/29/16:  Dist Cx lesion, 80 %stenosed.  Post intervention, there  is a 0% residual stenosis.  A stent was successfully placed.   Recent Labs: 07/29/2016: Magnesium 1.5 07/30/2016: ALT 26; BUN 17; Creatinine, Ser 1.18; Hemoglobin 13.2; Platelets 205; Potassium 4.4; Sodium 137      Lipid Panel    Component Value Date/Time   CHOL 174 05/28/2015 0951   TRIG 240.0 (H) 05/28/2015 0951   HDL 23.90 (L) 05/28/2015 0951   CHOLHDL 7 05/28/2015 0951   VLDL 48.0 (H) 05/28/2015 0951   LDLCALC 170 01/06/2015   LDLDIRECT 120.0 05/28/2015 0951      Wt Readings from Last 3 Encounters:  09/29/16 (!) 332 lb (150.6 kg)  08/27/16 (!) 333 lb 2 oz (151.1 kg)  07/30/16 (!) 338 lb 6.5 oz (153.5 kg)      Other studies Reviewed: Additional studies/ records that were reviewed today include:  Review of the above records demonstrates:  Please see elsewhere in the note.     ASSESSMENT AND PLAN:  # CAD s/p LCx PCI: Mr. Modi Continues to have exertional chest pain. He was previously ordered to have a repeat stress test but has not yet completed this due to family illness. He is agreeable to rescheduling this test. In the meantime he will continue aspirin, Plavix, and atorvastatin.  He is not interested in long-acting nitroglycerin because of headaches.  He should be on a beta blocker.  This will be addressed at follow up.  He was advised to go to the ED if he has chest pain that persists after 3 sublingual nitroglycerin.  # Smoking cessation: We discussed tobacco use.  Mr. Swatzell is continuing to cut back on his smoking.  He is not interested in pharmacologic intervention at this time. We discussed smoking cessation for 5 minutes.    # Hyperlipidemia: We will check fasting lipids and a CMP.  Continue atorvastatin.   Current medicines are reviewed at length with the patient today.  The patient does not have concerns regarding medicines.  The following changes have been made:  no change  Labs/ tests ordered today include: Lexiscan Myoview   Orders Placed This Encounter  Procedures  . Lipid panel  . Comprehensive metabolic panel     Disposition:   FU with Dr. Elmarie Shiley C. [place] in 3 months    Signed, Chilton Si, MD  09/29/2016 1:17 PM    [facility]

## 2016-09-29 ENCOUNTER — Ambulatory Visit (INDEPENDENT_AMBULATORY_CARE_PROVIDER_SITE_OTHER): Payer: Medicare Other | Admitting: Cardiovascular Disease

## 2016-09-29 ENCOUNTER — Encounter: Payer: Self-pay | Admitting: Cardiovascular Disease

## 2016-09-29 VITALS — BP 129/83 | HR 99 | Ht 77.0 in | Wt 332.0 lb

## 2016-09-29 DIAGNOSIS — E7800 Pure hypercholesterolemia, unspecified: Secondary | ICD-10-CM

## 2016-09-29 DIAGNOSIS — I209 Angina pectoris, unspecified: Secondary | ICD-10-CM

## 2016-09-29 DIAGNOSIS — I10 Essential (primary) hypertension: Secondary | ICD-10-CM

## 2016-09-29 DIAGNOSIS — Z955 Presence of coronary angioplasty implant and graft: Secondary | ICD-10-CM

## 2016-09-29 DIAGNOSIS — Z720 Tobacco use: Secondary | ICD-10-CM

## 2016-09-29 DIAGNOSIS — I119 Hypertensive heart disease without heart failure: Secondary | ICD-10-CM

## 2016-09-29 LAB — COMPREHENSIVE METABOLIC PANEL
ALT: 16 U/L (ref 9–46)
AST: 13 U/L (ref 10–40)
Albumin: 3.9 g/dL (ref 3.6–5.1)
Alkaline Phosphatase: 68 U/L (ref 40–115)
BUN: 22 mg/dL (ref 7–25)
CO2: 21 mmol/L (ref 20–31)
Calcium: 10.3 mg/dL (ref 8.6–10.3)
Chloride: 100 mmol/L (ref 98–110)
Creat: 1.21 mg/dL (ref 0.60–1.35)
Glucose, Bld: 341 mg/dL — ABNORMAL HIGH (ref 65–99)
Potassium: 5.4 mmol/L — ABNORMAL HIGH (ref 3.5–5.3)
Sodium: 134 mmol/L — ABNORMAL LOW (ref 135–146)
Total Bilirubin: 0.3 mg/dL (ref 0.2–1.2)
Total Protein: 6.8 g/dL (ref 6.1–8.1)

## 2016-09-29 LAB — LIPID PANEL
Cholesterol: 179 mg/dL (ref 125–200)
HDL: 27 mg/dL — ABNORMAL LOW (ref >=40)
LDL Cholesterol: 92 mg/dL (ref <130)
Total CHOL/HDL Ratio: 6.6 Ratio — ABNORMAL HIGH (ref <=5.0)
Triglycerides: 300 mg/dL — ABNORMAL HIGH (ref <150)
VLDL: 60 mg/dL — ABNORMAL HIGH (ref <30)

## 2016-09-29 NOTE — Addendum Note (Signed)
Addended by: Regis BillPRATT, Adarsh Mundorf B on: 09/29/2016 05:12 PM   Modules accepted: Orders

## 2016-09-29 NOTE — Patient Instructions (Addendum)
Medication Instructions:  Your physician recommends that you continue on your current medications as directed. Please refer to the Current Medication list given to you today.  Labwork: LP/CMET AT [facility] ON THE FIRST FLOOR  Testing/Procedures: Call [phone] to schedule your stress test. If there is a problem getting scheduled call our office   Follow-Up: Your physician recommends that you schedule a follow-up appointment in: 3 month ov  If you need a refill on your cardiac medications before your next appointment, please call your pharmacy.

## 2016-09-30 ENCOUNTER — Encounter (HOSPITAL_COMMUNITY): Payer: Medicare Other

## 2016-10-11 ENCOUNTER — Encounter (HOSPITAL_COMMUNITY): Payer: Medicare Other

## 2016-10-28 ENCOUNTER — Encounter (HOSPITAL_COMMUNITY)
Admission: RE | Admit: 2016-10-28 | Discharge: 2016-10-28 | Disposition: A | Discharge status: Home / Self Care | Payer: Medicare Other | Source: Ambulatory Visit | Attending: Cardiovascular Disease | Admitting: Cardiovascular Disease

## 2016-10-28 ENCOUNTER — Encounter (HOSPITAL_COMMUNITY): Payer: Self-pay

## 2016-10-28 VITALS — BP 100/70 | HR 114 | Ht 77.0 in | Wt 332.9 lb

## 2016-10-28 DIAGNOSIS — Z955 Presence of coronary angioplasty implant and graft: Secondary | ICD-10-CM | POA: Diagnosis not present

## 2016-10-28 NOTE — Progress Notes (Signed)
Cardiac/Pulmonary Rehab Medication Review by a Pharmacist  Does the patient  feel that his/her medications are working for him/her?  yes  Has the patient been experiencing any side effects to the medications prescribed?  no  Does the patient measure his/her own blood pressure or blood glucose at home?  no   Does the patient have any problems obtaining medications due to transportation or finances?   yes  Understanding of regimen: good Understanding of indications: good Potential of compliance: good   Pharmacist comments: Patient has been taking multiple medications for long time. Understands each medication and indication as well as new medication. Not experiencing any side effects. He has a sinus infection that has been worrisome for almost a year and started on clindamycin by PCP recently. Patient not always compliant with medications or monitoring blood sugar. Current HgA1cHe does not have a BP cuff to monitor his blood pressure, he would like to have one but unable to afford at this time. Suggest cardiology write prescription for, insurance may cover. Patient not taking qvar currently, waiting for authorization. Patient taking protonix but prefers prilosec for reflux.  Lot of social issues with family. Patient understands he needs to monitor BS more closely and get his Hgb A1c down to 7.  Thanks for the opportunity to work with this patient,  Elder CyphersLorie Malachi Kinzler, BS Loura Backharm D, [place] Clinical Pharmacist Pager [phone] 10/28/2016 10:31 AM

## 2016-10-28 NOTE — Progress Notes (Signed)
Cardiac Individual Treatment Plan  Patient Details  Name: Joseph [name] MRN: [id number] Date of Birth: July 29, 1971 Referring Provider:   Flowsheet Row CARDIAC REHAB PHASE II ORIENTATION from 10/28/2016 in [facility] CARD[facility]  Referring Provider  Dr. Allyson Sabal      Initial Encounter Date:  Flowsheet Row CARDIAC REHAB PHASE II ORIENTATION from 10/28/2016 in [place] CARDIAC REHABILITATION  Date  10/28/16  Referring Provider  Dr. Allyson Sabal      Visit Diagnosis: Status post coronary artery stent placement  Patient's Home Medications on Admission:  Current Outpatient Prescriptions:  .  clindamycin (CLEOCIN) 300 MG capsule, Take 300 mg by mouth 4 (four) times daily., Disp: , Rfl:  .  albuterol (PROAIR HFA) 108 (90 BASE) MCG/ACT inhaler, Inhale 2 puffs into the lungs every 6 (six) hours as needed for wheezing or shortness of breath. , Disp: , Rfl:  .  albuterol (PROVENTIL) (2.5 MG/3ML) 0.083% nebulizer solution, Take 2.5 mg by nebulization every 4 (four) hours as needed for wheezing or shortness of breath., Disp: , Rfl:  .  ALPRAZolam (XANAX) 1 MG tablet, Take 1-2 mg by mouth 4 (four) times daily. 2 mg every evening, Disp: , Rfl:  .  amLODipine (NORVASC) 2.5 MG tablet, Take 2.5 mg by mouth every morning. , Disp: , Rfl:  .  aspirin 81 MG chewable tablet, Chew 1 tablet (81 mg total) by mouth daily., Disp: 81 tablet, Rfl: 30 .  atorvastatin (LIPITOR) 40 MG tablet, Take 1 tablet (40 mg total) by mouth daily. (Patient taking differently: Take 40 mg by mouth daily at 6 PM. ), Disp: 30 tablet, Rfl: 6 .  beclomethasone (QVAR) 80 MCG/ACT inhaler, Inhale 2 puffs into the lungs 2 (two) times daily as needed (for wheezing or shortness of breath)., Disp: , Rfl:  .  buPROPion (WELLBUTRIN SR) 150 MG 12 hr tablet, Take 1 tablet by mouth 2 (two) times daily., Disp: , Rfl: 0 .  clopidogrel (PLAVIX) 75 MG tablet, Take 1 tablet (75 mg total) by mouth daily with breakfast., Disp: 90 tablet, Rfl: 3 .   diphenhydrAMINE (BENADRYL) 25 mg capsule, Take 25 mg by mouth every 6 (six) hours as needed for itching or allergies., Disp: , Rfl:  .  fenofibrate (TRICOR) 145 MG tablet, Take 145 mg by mouth every morning. , Disp: , Rfl:  .  fluconazole (DIFLUCAN) 100 MG tablet, Take 100 mg by mouth every Monday. , Disp: , Rfl: 0 .  fluticasone (FLONASE) 50 MCG/ACT nasal spray, Place 2 sprays into both nostrils daily as needed for allergies or rhinitis., Disp: , Rfl:  .  glucose blood (ONETOUCH VERIO) test strip, Use to test blood sugar 3 times daily as instructed. Dx: E10.9, Disp: 100 each, Rfl: 5 .  HUMALOG KWIKPEN 100 UNIT/ML SOPN, Inject 35-40 Units into the skin 3 (three) times daily with meals. As directed per sliding scale, Disp: , Rfl:  .  Insulin Pen Needle 32G X 4 MM MISC, Use to inject insulin 4 times daily. Dx: E10.9, Disp: 130 each, Rfl: 5 .  LANTUS SOLOSTAR 100 UNIT/ML Solostar Pen, Inject 70 Units into the skin 2 (two) times daily. , Disp: , Rfl:  .  lisinopril (PRINIVIL,ZESTRIL) 5 MG tablet, Take 5 mg by mouth daily., Disp: , Rfl:  .  LYRICA 50 MG capsule, Take 50-100 mg by mouth See admin instructions. 100 mg every morning and 50 mg every evening, Disp: , Rfl: 5 .  meclizine (ANTIVERT) 25 MG tablet, Take 25 mg by  mouth 3 (three) times daily as needed for dizziness. 25 mg every morning and 25 mg every evening.  May take an additional 25 mg during the day, Disp: , Rfl:  .  metFORMIN (GLUCOPHAGE) 1000 MG tablet, Take 1,000 mg by mouth 2 (two) times daily with a meal.  , Disp: , Rfl:  .  nitroGLYCERIN (NITROSTAT) 0.4 MG SL tablet, Place 1 tablet (0.4 mg total) under the tongue every 5 (five) minutes as needed for chest pain., Disp: 25 tablet, Rfl: 12 .  omeprazole (PRILOSEC) 40 MG capsule, Take 1 capsule by mouth daily., Disp: , Rfl: 6 .  oxyCODONE (ROXICODONE) 15 MG immediate release tablet, Take 15 mg by mouth 5 (five) times daily. , Disp: , Rfl:  .  pantoprazole (PROTONIX) 40 MG tablet, TAKE 2  TABLETS(80 MG) BY MOUTH DAILY, Disp: 180 tablet, Rfl: 0 .  promethazine (PHENERGAN) 25 MG tablet, Take 25 mg by mouth every 6 (six) hours as needed for nausea. , Disp: , Rfl:  .  Rivaroxaban (XARELTO) 20 MG TABS, Take 1 tablet (20 mg total) by mouth daily. (Patient taking differently: Take 20 mg by mouth daily. Take with food), Disp: 30 tablet, Rfl: 1 .  tiZANidine (ZANAFLEX) 4 MG tablet, Take 4 mg by mouth daily as needed for muscle spasms. , Disp: , Rfl: 0 .  VICTOZA 18 MG/3ML SOPN, Inject 0.2 mLs (1.2 mg total) into the skin daily. Inject once daily at the same time, Disp: 2 pen, Rfl: 3  Past Medical History: Past Medical History:  Diagnosis Date  . Anxiety   . Arthritis    "knees, shoulders, hips, ankles" (07/29/2016)  . Asthma   . CAD (coronary artery disease)   . Chronic bronchitis (HCC)   . COPD (chronic obstructive pulmonary disease) (HCC)   . Depression   . GERD (gastroesophageal reflux disease)   . Headache    "about 3 times/month" (07/29/2016)  . Hyperlipidemia   . Hypertension   . Myocardial infarction 1996   "light one"  . PE (pulmonary embolism) 04/2013  . Pneumonia "several times"  . Type II diabetes mellitus (HCC)     Tobacco Use: History  Smoking Status  . Current Every Day Smoker  . Packs/day: 0.50  . Years: 34.00  . Types: Cigarettes  Smokeless Tobacco  . Current User  . Types: Snuff, Chew    Comment: 8/910/2017 "3 ppd before 2015"    Labs: Recent Review Flowsheet Data    Labs for ITP Cardiac and Pulmonary Rehab Latest Ref Rng & Units 01/06/2015 04/04/2015 05/28/2015 07/29/2016 09/29/2016   Cholestrol 125 - 200 mg/dL - - 161 - 096   LDLCALC <130 mg/dL 045 - - - 92   LDLDIRECT mg/dL - - 409.8 - -   HDL >=11 mg/dL - - 91.47(W) - 29(F)   Trlycerides <150 mg/dL - - 621.0(H) - 300(H)   Hemoglobin A1c 4.8 - 5.6 % - 11.1(A) - 11.6(H) -   TCO2 0 - 100 mmol/L - - - - -      Capillary Blood Glucose: Lab Results  Component Value Date   GLUCAP 313 (H)  07/30/2016   GLUCAP 260 (H) 07/30/2016   GLUCAP 271 (H) 07/29/2016   GLUCAP 158 (H) 07/29/2016   GLUCAP 227 (H) 07/29/2016     Exercise Target Goals: Date: 10/28/16  Exercise Program Goal: Individual exercise prescription set with THRR, safety & activity barriers. Participant demonstrates ability to understand and report RPE using BORG scale, to self-measure pulse accurately,  and to acknowledge the importance of the exercise prescription.  Exercise Prescription Goal: Starting with aerobic activity 30 plus minutes a day, 3 days per week for initial exercise prescription. Provide home exercise prescription and guidelines that participant acknowledges understanding prior to discharge.  Activity Barriers & Risk Stratification:     Activity Barriers & Cardiac Risk Stratification - 10/28/16 1054      Activity Barriers & Cardiac Risk Stratification   Activity Barriers Back Problems;Joint Problems;Balance Concerns  Chronic low back, knee and r/shoulder pain.    Cardiac Risk Stratification High      6 Minute Walk:     6 Minute Walk    Row Name 10/28/16 1154         6 Minute Walk   Phase Initial     Distance 1200 feet     Walk Time 6 minutes     # of Rest Breaks 0     MPH 2.27     METS 2.74     RPE 9     Perceived Dyspnea  11     VO2 Peak 12.14     Symptoms No     Resting HR 114 bpm     Resting BP 100/70     Max Ex. HR 116 bpm     Max Ex. BP 118/78     2 Minute Post BP 110/70        Initial Exercise Prescription:     Initial Exercise Prescription - 10/28/16 1100      Date of Initial Exercise RX and Referring Provider   Date 10/28/16   Referring Provider Dr. Bevelyn NgoBerry     NuStep   Level 2   Watts 15   Minutes 15   METs 1.9     Arm Ergometer   Level 2   Watts 15   Minutes 20   METs 1.9     Prescription Details   Frequency (times per week) 3   Duration Progress to 30 minutes of continuous aerobic without signs/symptoms of physical distress     Intensity    THRR REST +  30   THRR 40-80% of Max Heartrate 140-152-164   Ratings of Perceived Exertion 11-13   Perceived Dyspnea 0-4     Progression   Progression Continue progressive overload as per policy without signs/symptoms or physical distress.     Resistance Training   Training Prescription Yes   Weight 1   Reps 10-12      Perform Capillary Blood Glucose checks as needed.  Exercise Prescription Changes:   Exercise Comments:    Discharge Exercise Prescription (Final Exercise Prescription Changes):   Nutrition:  Target Goals: Understanding of nutrition guidelines, daily intake of sodium 1500mg , cholesterol 200mg , calories 30% from fat and 7% or less from saturated fats, daily to have 5 or more servings of fruits and vegetables.  Biometrics:     Pre Biometrics - 10/28/16 1157      Pre Biometrics   Height 6\' 5"  (1.956 m)   Weight (!)  332 lb 14.3 oz (151 kg)   Waist Circumference 51 inches   Hip Circumference 49 inches   Waist to Hip Ratio 1.04 %   BMI (Calculated) 39.6   Triceps Skinfold 13 mm   % Body Fat 34.5 %   Grip Strength 99 kg   Flexibility 19.08 in   Single Leg Stand 2 seconds       Nutrition Therapy Plan and Nutrition Goals:   Nutrition Discharge: Rate  Your Plate Scores:     Nutrition Assessments - 10/28/16 1058      MEDFICTS Scores   Pre Score 121      Nutrition Goals Re-Evaluation:   Psychosocial: Target Goals: Acknowledge presence or absence of depression, maximize coping skills, provide positive support system. Participant is able to verbalize types and ability to use techniques and skills needed for reducing stress and depression.  Initial Review & Psychosocial Screening:     Initial Psych Review & Screening - 10/28/16 1126      Initial Review   Current issues with Current Depression;Current Anxiety/Panic;Current Stress Concerns   Source of Stress Concerns --  Patient's son in poor health. His stress level is related to his  son's health.      Family Dynamics   Good Support System? Yes     Barriers   Psychosocial barriers to participate in program Psychosocial barriers identified (see note)  Patient has depression/anxiety. He is taking medication for both. He has had counseling in the past but says he feels he does not need counseling at this time. He has been treated in behavioral health 2 times in the past appx 10 years ago.      Screening Interventions   Interventions Encouraged to exercise;Other (comment)   Comments Patient's QOL score was 10.11 and his PHQ-9 score was 14. He says he does not feel he needs counseling at this time.       Quality of Life Scores:     Quality of Life - 10/28/16 1157      Quality of Life Scores   Health/Function Pre 7.68 %   Socioeconomic Pre 8.64 %   Psych/Spiritual Pre 8.7 %   Family Pre 20.4 %   GLOBAL Pre 10.11 %      PHQ-9: Recent Review Flowsheet Data    Depression screen PHQ 2/9 10/28/2016   Decreased Interest 1   Down, Depressed, Hopeless 2   PHQ - 2 Score 3   Altered sleeping 2   Tired, decreased energy 2   Change in appetite 2   Feeling bad or failure about yourself  3   Trouble concentrating 1   Moving slowly or fidgety/restless 1   Suicidal thoughts 0   PHQ-9 Score 14   Difficult doing work/chores Not difficult at all      Psychosocial Evaluation and Intervention:     Psychosocial Evaluation - 10/28/16 1129      Psychosocial Evaluation & Interventions   Interventions Encouraged to exercise with the program and follow exercise prescription;Relaxation education;Stress management education   Comments Patient's QOL score was 10.11 and his PHQ-9 score was 14. He says he is depressed but feels he does not need counseling.    Continued Psychosocial Services Needed Yes      Psychosocial Re-Evaluation:   Vocational Rehabilitation: Provide vocational rehab assistance to qualifying candidates.   Vocational Rehab Evaluation & Intervention:      Vocational Rehab - 10/28/16 1056      Initial Vocational Rehab Evaluation & Intervention   Assessment shows need for Vocational Rehabilitation No      Education: Education Goals: Education classes will be provided on a weekly basis, covering required topics. Participant will state understanding/return demonstration of topics presented.  Learning Barriers/Preferences:     Learning Barriers/Preferences - 10/28/16 1055      Learning Barriers/Preferences   Learning Barriers None   Learning Preferences Skilled Demonstration      Education Topics: Hypertension, Hypertension Reduction -Define heart disease and  high blood pressure. Discus how high blood pressure affects the body and ways to reduce high blood pressure.   Exercise and Your Heart -Discuss why it is important to exercise, the FITT principles of exercise, normal and abnormal responses to exercise, and how to exercise safely.   Angina -Discuss definition of angina, causes of angina, treatment of angina, and how to decrease risk of having angina.   Cardiac Medications -Review what the following cardiac medications are used for, how they affect the body, and side effects that may occur when taking the medications.  Medications include Aspirin, Beta blockers, calcium channel blockers, ACE Inhibitors, angiotensin receptor blockers, diuretics, digoxin, and antihyperlipidemics.   Congestive Heart Failure -Discuss the definition of CHF, how to live with CHF, the signs and symptoms of CHF, and how keep track of weight and sodium intake.   Heart Disease and Intimacy -Discus the effect sexual activity has on the heart, how changes occur during intimacy as we age, and safety during sexual activity.   Smoking Cessation / COPD -Discuss different methods to quit smoking, the health benefits of quitting smoking, and the definition of COPD.   Nutrition I: Fats -Discuss the types of cholesterol, what cholesterol does to the  heart, and how cholesterol levels can be controlled.   Nutrition II: Labels -Discuss the different components of food labels and how to read food label   Heart Parts and Heart Disease -Discuss the anatomy of the heart, the pathway of blood circulation through the heart, and these are affected by heart disease.   Stress I: Signs and Symptoms -Discuss the causes of stress, how stress may lead to anxiety and depression, and ways to limit stress.   Stress II: Relaxation -Discuss different types of relaxation techniques to limit stress.   Warning Signs of Stroke / TIA -Discuss definition of a stroke, what the signs and symptoms are of a stroke, and how to identify when someone is having stroke.   Knowledge Questionnaire Score:     Knowledge Questionnaire Score - 10/28/16 1056      Knowledge Questionnaire Score   Pre Score 26/28      Core Components/Risk Factors/Patient Goals at Admission:     Personal Goals and Risk Factors at Admission - 10/28/16 1121      Core Components/Risk Factors/Patient Goals on Admission    Weight Management Yes;Weight Loss   Intervention Weight Management/Obesity: Establish reasonable short term and long term weight goals.;Obesity: Provide education and appropriate resources to help participant work on and attain dietary goals.   Admit Weight 332 lb 11.2 oz (150.9 kg)   Goal Weight: Short Term 327 lb (148.3 kg)   Goal Weight: Long Term 317 lb (143.8 kg)   Sedentary Yes   Intervention Provide advice, education, support and counseling about physical activity/exercise needs.;Develop an individualized exercise prescription for aerobic and resistive training based on initial evaluation findings, risk stratification, comorbidities and participant's personal goals.   Expected Outcomes Achievement of increased cardiorespiratory fitness and enhanced flexibility, muscular endurance and strength shown through measurements of functional capacity and personal  statement of participant.   Increase Strength and Stamina Yes   Intervention Provide advice, education, support and counseling about physical activity/exercise needs.;Develop an individualized exercise prescription for aerobic and resistive training based on initial evaluation findings, risk stratification, comorbidities and participant's personal goals.   Expected Outcomes Achievement of increased cardiorespiratory fitness and enhanced flexibility, muscular endurance and strength shown through measurements of functional capacity and personal statement of participant.  Tobacco Cessation Yes   Number of packs per day 1/2   Intervention --  Patient says he is not interesting in quiting smoking.    Diabetes Yes   Expected Outcomes Long Term: Attainment of HbA1C < 7%.   Stress Yes   Intervention Offer individual and/or small group education and counseling on adjustment to heart disease, stress management and health-related lifestyle change. Teach and support self-help strategies.   Expected Outcomes Short Term: Participant demonstrates changes in health-related behavior, relaxation and other stress management skills, ability to obtain effective social support, and compliance with psychotropic medications if prescribed.;Long Term: Emotional wellbeing is indicated by absence of clinically significant psychosocial distress or social isolation.   Personal Goal Other Yes   Personal Goal Patient wants to feel good again.    Intervention Patient will attend cardiac rehab exercising 3 days/week and supplement at home exercising 2 days/week.    Expected Outcomes Patient will meet his personal goals.       Core Components/Risk Factors/Patient Goals Review:      Goals and Risk Factor Review    Row Name 10/28/16 1126             Core Components/Risk Factors/Patient Goals Review   Personal Goals Review Weight Management/Obesity;Increase Strength and Stamina          Core Components/Risk  Factors/Patient Goals at Discharge (Final Review):      Goals and Risk Factor Review - 10/28/16 1126      Core Components/Risk Factors/Patient Goals Review   Personal Goals Review Weight Management/Obesity;Increase Strength and Stamina      ITP Comments:   Comments: .Patient arrived for 1st visit/orientation/education at 0800. Patient was referred to CR by Dr. Allyson SabalBerry due to S/P Stent Placement (Z95.5). During orientation advised patient on arrival and appointment times what to wear, what to do before, during and after exercise. Reviewed attendance and class policy. Talked about inclement weather and class consultation policy. Pt is scheduled to return Cardiac Rehab on 11/01/2016 at 3:45. Pt was advised to come to class 15 minutes before class starts. Patient was also given instructions on meeting with the dietician and attending the Family Structure classes. Pt is eager to get started. Patient participated in warm up stretches followed by light weights and resistance bands. Patient was able to complete 6 minute walk test. He c/o bilateral knee pain and right shoulder pain 3/10 which is chronic and constant. Patient was measured for the equipment. Discussed equipment safety with patient. Took patient pre-anthropometric measurements. Patient finished visit at 1110.

## 2016-11-01 ENCOUNTER — Encounter (HOSPITAL_COMMUNITY): Payer: Medicare Other

## 2016-11-01 ENCOUNTER — Emergency Department (HOSPITAL_COMMUNITY)
Admission: EM | Admit: 2016-11-01 | Discharge: 2016-11-01 | Disposition: A | Discharge status: Home / Self Care | Payer: Medicare Other | Attending: Emergency Medicine | Admitting: Emergency Medicine

## 2016-11-01 ENCOUNTER — Encounter (HOSPITAL_COMMUNITY): Payer: Self-pay | Admitting: Emergency Medicine

## 2016-11-01 ENCOUNTER — Encounter (HOSPITAL_COMMUNITY): Payer: Self-pay

## 2016-11-01 DIAGNOSIS — J449 Chronic obstructive pulmonary disease, unspecified: Secondary | ICD-10-CM | POA: Insufficient documentation

## 2016-11-01 DIAGNOSIS — I2510 Atherosclerotic heart disease of native coronary artery without angina pectoris: Secondary | ICD-10-CM | POA: Diagnosis not present

## 2016-11-01 DIAGNOSIS — Z7982 Long term (current) use of aspirin: Secondary | ICD-10-CM | POA: Insufficient documentation

## 2016-11-01 DIAGNOSIS — Z79899 Other long term (current) drug therapy: Secondary | ICD-10-CM | POA: Diagnosis not present

## 2016-11-01 DIAGNOSIS — Z7984 Long term (current) use of oral hypoglycemic drugs: Secondary | ICD-10-CM | POA: Insufficient documentation

## 2016-11-01 DIAGNOSIS — R519 Headache, unspecified: Secondary | ICD-10-CM

## 2016-11-01 DIAGNOSIS — F17290 Nicotine dependence, other tobacco product, uncomplicated: Secondary | ICD-10-CM | POA: Insufficient documentation

## 2016-11-01 DIAGNOSIS — J45909 Unspecified asthma, uncomplicated: Secondary | ICD-10-CM | POA: Diagnosis not present

## 2016-11-01 DIAGNOSIS — I10 Essential (primary) hypertension: Secondary | ICD-10-CM | POA: Diagnosis not present

## 2016-11-01 DIAGNOSIS — Z794 Long term (current) use of insulin: Secondary | ICD-10-CM | POA: Diagnosis not present

## 2016-11-01 DIAGNOSIS — F17210 Nicotine dependence, cigarettes, uncomplicated: Secondary | ICD-10-CM | POA: Diagnosis not present

## 2016-11-01 DIAGNOSIS — R51 Headache: Secondary | ICD-10-CM | POA: Diagnosis present

## 2016-11-01 DIAGNOSIS — E119 Type 2 diabetes mellitus without complications: Secondary | ICD-10-CM | POA: Insufficient documentation

## 2016-11-01 DIAGNOSIS — F17220 Nicotine dependence, chewing tobacco, uncomplicated: Secondary | ICD-10-CM | POA: Insufficient documentation

## 2016-11-01 DIAGNOSIS — R42 Dizziness and giddiness: Secondary | ICD-10-CM | POA: Diagnosis not present

## 2016-11-01 LAB — I-STAT CHEM 8, ED
BUN: 28 mg/dL — ABNORMAL HIGH (ref 6–20)
Calcium, Ion: 1.05 mmol/L — ABNORMAL LOW (ref 1.15–1.40)
Chloride: 103 mmol/L (ref 101–111)
Creatinine, Ser: 1.3 mg/dL — ABNORMAL HIGH (ref 0.61–1.24)
Glucose, Bld: 364 mg/dL — ABNORMAL HIGH (ref 65–99)
HCT: 43 % (ref 39.0–52.0)
Hemoglobin: 14.6 g/dL (ref 13.0–17.0)
Potassium: 5.2 mmol/L — ABNORMAL HIGH (ref 3.5–5.1)
Sodium: 134 mmol/L — ABNORMAL LOW (ref 135–145)
TCO2: 22 mmol/L (ref 0–100)

## 2016-11-01 NOTE — Progress Notes (Signed)
Cardiac Individual Treatment Plan  Patient Details  Name: JOHNNEY SCARLATA MRN: [id number] Date of Birth: July 29, 1971 Referring Provider:   Flowsheet Row CARDIAC REHAB PHASE II ORIENTATION from 10/28/2016 in [facility]  Referring Provider  Dr. Allyson Sabal      Initial Encounter Date:  Flowsheet Row CARDIAC REHAB PHASE II ORIENTATION from 10/28/2016 in [place] [place] CARDIAC RE[facility]  Date  10/28/16  Referring Provider  Dr. Allyson Sabal      Visit Diagnosis: Status post coronary artery stent placement  Patient's Home Medications on Admission:  Current Outpatient Prescriptions:  .  clindamycin (CLEOCIN) 300 MG capsule, Take 300 mg by mouth 4 (four) times daily., Disp: , Rfl:  .  albuterol (PROAIR HFA) 108 (90 BASE) MCG/ACT inhaler, Inhale 2 puffs into the lungs every 6 (six) hours as needed for wheezing or shortness of breath. , Disp: , Rfl:  .  albuterol (PROVENTIL) (2.5 MG/3ML) 0.083% nebulizer solution, Take 2.5 mg by nebulization every 4 (four) hours as needed for wheezing or shortness of breath., Disp: , Rfl:  .  ALPRAZolam (XANAX) 1 MG tablet, Take 1-2 mg by mouth 4 (four) times daily. 2 mg every evening, Disp: , Rfl:  .  amLODipine (NORVASC) 2.5 MG tablet, Take 2.5 mg by mouth every morning. , Disp: , Rfl:  .  aspirin 81 MG chewable tablet, Chew 1 tablet (81 mg total) by mouth daily., Disp: 81 tablet, Rfl: 30 .  atorvastatin (LIPITOR) 40 MG tablet, Take 1 tablet (40 mg total) by mouth daily. (Patient taking differently: Take 40 mg by mouth daily at 6 PM. ), Disp: 30 tablet, Rfl: 6 .  beclomethasone (QVAR) 80 MCG/ACT inhaler, Inhale 2 puffs into the lungs 2 (two) times daily as needed (for wheezing or shortness of breath)., Disp: , Rfl:  .  buPROPion (WELLBUTRIN SR) 150 MG 12 hr tablet, Take 1 tablet by mouth 2 (two) times daily., Disp: , Rfl: 0 .  clopidogrel (PLAVIX) 75 MG tablet, Take 1 tablet (75 mg total) by mouth daily with breakfast., Disp: 90 tablet, Rfl: 3 .   diphenhydrAMINE (BENADRYL) 25 mg capsule, Take 25 mg by mouth every 6 (six) hours as needed for itching or allergies., Disp: , Rfl:  .  fenofibrate (TRICOR) 145 MG tablet, Take 145 mg by mouth every morning. , Disp: , Rfl:  .  fluconazole (DIFLUCAN) 100 MG tablet, Take 100 mg by mouth every Monday. , Disp: , Rfl: 0 .  fluticasone (FLONASE) 50 MCG/ACT nasal spray, Place 2 sprays into both nostrils daily as needed for allergies or rhinitis., Disp: , Rfl:  .  glucose blood (ONETOUCH VERIO) test strip, Use to test blood sugar 3 times daily as instructed. Dx: E10.9, Disp: 100 each, Rfl: 5 .  HUMALOG KWIKPEN 100 UNIT/ML SOPN, Inject 35-40 Units into the skin 3 (three) times daily with meals. As directed per sliding scale, Disp: , Rfl:  .  Insulin Pen Needle 32G X 4 MM MISC, Use to inject insulin 4 times daily. Dx: E10.9, Disp: 130 each, Rfl: 5 .  LANTUS SOLOSTAR 100 UNIT/ML Solostar Pen, Inject 70 Units into the skin 2 (two) times daily. , Disp: , Rfl:  .  lisinopril (PRINIVIL,ZESTRIL) 5 MG tablet, Take 5 mg by mouth daily., Disp: , Rfl:  .  LYRICA 50 MG capsule, Take 50-100 mg by mouth See admin instructions. 100 mg every morning and 50 mg every evening, Disp: , Rfl: 5 .  meclizine (ANTIVERT) 25 MG tablet, Take 25 mg by  mouth 3 (three) times daily as needed for dizziness. 25 mg every morning and 25 mg every evening.  May take an additional 25 mg during the day, Disp: , Rfl:  .  metFORMIN (GLUCOPHAGE) 1000 MG tablet, Take 1,000 mg by mouth 2 (two) times daily with a meal.  , Disp: , Rfl:  .  nitroGLYCERIN (NITROSTAT) 0.4 MG SL tablet, Place 1 tablet (0.4 mg total) under the tongue every 5 (five) minutes as needed for chest pain., Disp: 25 tablet, Rfl: 12 .  omeprazole (PRILOSEC) 40 MG capsule, Take 1 capsule by mouth daily., Disp: , Rfl: 6 .  oxyCODONE (ROXICODONE) 15 MG immediate release tablet, Take 15 mg by mouth 5 (five) times daily. , Disp: , Rfl:  .  pantoprazole (PROTONIX) 40 MG tablet, TAKE 2  TABLETS(80 MG) BY MOUTH DAILY, Disp: 180 tablet, Rfl: 0 .  promethazine (PHENERGAN) 25 MG tablet, Take 25 mg by mouth every 6 (six) hours as needed for nausea. , Disp: , Rfl:  .  Rivaroxaban (XARELTO) 20 MG TABS, Take 1 tablet (20 mg total) by mouth daily. (Patient taking differently: Take 20 mg by mouth daily. Take with food), Disp: 30 tablet, Rfl: 1 .  tiZANidine (ZANAFLEX) 4 MG tablet, Take 4 mg by mouth daily as needed for muscle spasms. , Disp: , Rfl: 0 .  VICTOZA 18 MG/3ML SOPN, Inject 0.2 mLs (1.2 mg total) into the skin daily. Inject once daily at the same time, Disp: 2 pen, Rfl: 3  Past Medical History: Past Medical History:  Diagnosis Date  . Anxiety   . Arthritis    "knees, shoulders, hips, ankles" (07/29/2016)  . Asthma   . CAD (coronary artery disease)   . Chronic bronchitis (HCC)   . COPD (chronic obstructive pulmonary disease) (HCC)   . Depression   . GERD (gastroesophageal reflux disease)   . Headache    "about 3 times/month" (07/29/2016)  . Hyperlipidemia   . Hypertension   . Myocardial infarction 1996   "light one"  . PE (pulmonary embolism) 04/2013  . Pneumonia "several times"  . Type II diabetes mellitus (HCC)     Tobacco Use: History  Smoking Status  . Current Every Day Smoker  . Packs/day: 0.50  . Years: 34.00  . Types: Cigarettes  Smokeless Tobacco  . Current User  . Types: Snuff, Chew    Comment: 8/910/2017 "3 ppd before 2015"    Labs: Recent Review Flowsheet Data    Labs for ITP Cardiac and Pulmonary Rehab Latest Ref Rng & Units 01/06/2015 04/04/2015 05/28/2015 07/29/2016 09/29/2016   Cholestrol 125 - 200 mg/dL - - 161 - 096   LDLCALC <130 mg/dL 045 - - - 92   LDLDIRECT mg/dL - - 409.8 - -   HDL >=11 mg/dL - - 91.47(W) - 29(F)   Trlycerides <150 mg/dL - - 621.0(H) - 300(H)   Hemoglobin A1c 4.8 - 5.6 % - 11.1(A) - 11.6(H) -   TCO2 0 - 100 mmol/L - - - - -      Capillary Blood Glucose: Lab Results  Component Value Date   GLUCAP 313 (H)  07/30/2016   GLUCAP 260 (H) 07/30/2016   GLUCAP 271 (H) 07/29/2016   GLUCAP 158 (H) 07/29/2016   GLUCAP 227 (H) 07/29/2016     Exercise Target Goals: Date: 10/28/16  Exercise Program Goal: Individual exercise prescription set with THRR, safety & activity barriers. Participant demonstrates ability to understand and report RPE using BORG scale, to self-measure pulse accurately,  and to acknowledge the importance of the exercise prescription.  Exercise Prescription Goal: Starting with aerobic activity 30 plus minutes a day, 3 days per week for initial exercise prescription. Provide home exercise prescription and guidelines that participant acknowledges understanding prior to discharge.  Activity Barriers & Risk Stratification:     Activity Barriers & Cardiac Risk Stratification - 10/28/16 1054      Activity Barriers & Cardiac Risk Stratification   Activity Barriers Back Problems;Joint Problems;Balance Concerns  Chronic low back, knee and r/shoulder pain.    Cardiac Risk Stratification High      6 Minute Walk:     6 Minute Walk    Row Name 10/28/16 1154         6 Minute Walk   Phase Initial     Distance 1200 feet     Walk Time 6 minutes     # of Rest Breaks 0     MPH 2.27     METS 2.74     RPE 9     Perceived Dyspnea  11     VO2 Peak 12.14     Symptoms No     Resting HR 114 bpm     Resting BP 100/70     Max Ex. HR 116 bpm     Max Ex. BP 118/78     2 Minute Post BP 110/70        Initial Exercise Prescription:     Initial Exercise Prescription - 10/28/16 1100      Date of Initial Exercise RX and Referring Provider   Date 10/28/16   Referring Provider Dr. Bevelyn NgoBerry     NuStep   Level 2   Watts 15   Minutes 15   METs 1.9     Arm Ergometer   Level 2   Watts 15   Minutes 20   METs 1.9     Prescription Details   Frequency (times per week) 3   Duration Progress to 30 minutes of continuous aerobic without signs/symptoms of physical distress     Intensity    THRR REST +  30   THRR 40-80% of Max Heartrate 140-152-164   Ratings of Perceived Exertion 11-13   Perceived Dyspnea 0-4     Progression   Progression Continue progressive overload as per policy without signs/symptoms or physical distress.     Resistance Training   Training Prescription Yes   Weight 1   Reps 10-12      Perform Capillary Blood Glucose checks as needed.  Exercise Prescription Changes:   Exercise Comments:    Discharge Exercise Prescription (Final Exercise Prescription Changes):   Nutrition:  Target Goals: Understanding of nutrition guidelines, daily intake of sodium 1500mg , cholesterol 200mg , calories 30% from fat and 7% or less from saturated fats, daily to have 5 or more servings of fruits and vegetables.  Biometrics:     Pre Biometrics - 10/28/16 1157      Pre Biometrics   Height 6\' 5"  (1.956 m)   Weight (!)  332 lb 14.3 oz (151 kg)   Waist Circumference 51 inches   Hip Circumference 49 inches   Waist to Hip Ratio 1.04 %   BMI (Calculated) 39.6   Triceps Skinfold 13 mm   % Body Fat 34.5 %   Grip Strength 99 kg   Flexibility 19.08 in   Single Leg Stand 2 seconds       Nutrition Therapy Plan and Nutrition Goals:   Nutrition Discharge: Rate  Your Plate Scores:     Nutrition Assessments - 10/28/16 1058      MEDFICTS Scores   Pre Score 121      Nutrition Goals Re-Evaluation:   Psychosocial: Target Goals: Acknowledge presence or absence of depression, maximize coping skills, provide positive support system. Participant is able to verbalize types and ability to use techniques and skills needed for reducing stress and depression.  Initial Review & Psychosocial Screening:     Initial Psych Review & Screening - 10/28/16 1126      Initial Review   Current issues with Current Depression;Current Anxiety/Panic;Current Stress Concerns   Source of Stress Concerns --  Patient's son in poor health. His stress level is related to his  son's health.      Family Dynamics   Good Support System? Yes     Barriers   Psychosocial barriers to participate in program Psychosocial barriers identified (see note)  Patient has depression/anxiety. He is taking medication for both. He has had counseling in the past but says he feels he does not need counseling at this time. He has been treated in behavioral health 2 times in the past appx 10 years ago.      Screening Interventions   Interventions Encouraged to exercise;Other (comment)   Comments Patient's QOL score was 10.11 and his PHQ-9 score was 14. He says he does not feel he needs counseling at this time.       Quality of Life Scores:     Quality of Life - 10/28/16 1157      Quality of Life Scores   Health/Function Pre 7.68 %   Socioeconomic Pre 8.64 %   Psych/Spiritual Pre 8.7 %   Family Pre 20.4 %   GLOBAL Pre 10.11 %      PHQ-9: Recent Review Flowsheet Data    Depression screen PHQ 2/9 10/28/2016   Decreased Interest 1   Down, Depressed, Hopeless 2   PHQ - 2 Score 3   Altered sleeping 2   Tired, decreased energy 2   Change in appetite 2   Feeling bad or failure about yourself  3   Trouble concentrating 1   Moving slowly or fidgety/restless 1   Suicidal thoughts 0   PHQ-9 Score 14   Difficult doing work/chores Not difficult at all      Psychosocial Evaluation and Intervention:     Psychosocial Evaluation - 10/28/16 1129      Psychosocial Evaluation & Interventions   Interventions Encouraged to exercise with the program and follow exercise prescription;Relaxation education;Stress management education   Comments Patient's QOL score was 10.11 and his PHQ-9 score was 14. He says he is depressed but feels he does not need counseling.    Continued Psychosocial Services Needed Yes      Psychosocial Re-Evaluation:   Vocational Rehabilitation: Provide vocational rehab assistance to qualifying candidates.   Vocational Rehab Evaluation & Intervention:      Vocational Rehab - 10/28/16 1056      Initial Vocational Rehab Evaluation & Intervention   Assessment shows need for Vocational Rehabilitation No      Education: Education Goals: Education classes will be provided on a weekly basis, covering required topics. Participant will state understanding/return demonstration of topics presented.  Learning Barriers/Preferences:     Learning Barriers/Preferences - 10/28/16 1055      Learning Barriers/Preferences   Learning Barriers None   Learning Preferences Skilled Demonstration      Education Topics: Hypertension, Hypertension Reduction -Define heart disease and  high blood pressure. Discus how high blood pressure affects the body and ways to reduce high blood pressure.   Exercise and Your Heart -Discuss why it is important to exercise, the FITT principles of exercise, normal and abnormal responses to exercise, and how to exercise safely.   Angina -Discuss definition of angina, causes of angina, treatment of angina, and how to decrease risk of having angina.   Cardiac Medications -Review what the following cardiac medications are used for, how they affect the body, and side effects that may occur when taking the medications.  Medications include Aspirin, Beta blockers, calcium channel blockers, ACE Inhibitors, angiotensin receptor blockers, diuretics, digoxin, and antihyperlipidemics.   Congestive Heart Failure -Discuss the definition of CHF, how to live with CHF, the signs and symptoms of CHF, and how keep track of weight and sodium intake.   Heart Disease and Intimacy -Discus the effect sexual activity has on the heart, how changes occur during intimacy as we age, and safety during sexual activity.   Smoking Cessation / COPD -Discuss different methods to quit smoking, the health benefits of quitting smoking, and the definition of COPD.   Nutrition I: Fats -Discuss the types of cholesterol, what cholesterol does to the  heart, and how cholesterol levels can be controlled.   Nutrition II: Labels -Discuss the different components of food labels and how to read food label   Heart Parts and Heart Disease -Discuss the anatomy of the heart, the pathway of blood circulation through the heart, and these are affected by heart disease.   Stress I: Signs and Symptoms -Discuss the causes of stress, how stress may lead to anxiety and depression, and ways to limit stress.   Stress II: Relaxation -Discuss different types of relaxation techniques to limit stress.   Warning Signs of Stroke / TIA -Discuss definition of a stroke, what the signs and symptoms are of a stroke, and how to identify when someone is having stroke.   Knowledge Questionnaire Score:     Knowledge Questionnaire Score - 10/28/16 1056      Knowledge Questionnaire Score   Pre Score 26/28      Core Components/Risk Factors/Patient Goals at Admission:     Personal Goals and Risk Factors at Admission - 10/28/16 1121      Core Components/Risk Factors/Patient Goals on Admission    Weight Management Yes;Weight Loss   Intervention Weight Management/Obesity: Establish reasonable short term and long term weight goals.;Obesity: Provide education and appropriate resources to help participant work on and attain dietary goals.   Admit Weight 332 lb 11.2 oz (150.9 kg)   Goal Weight: Short Term 327 lb (148.3 kg)   Goal Weight: Long Term 317 lb (143.8 kg)   Sedentary Yes   Intervention Provide advice, education, support and counseling about physical activity/exercise needs.;Develop an individualized exercise prescription for aerobic and resistive training based on initial evaluation findings, risk stratification, comorbidities and participant's personal goals.   Expected Outcomes Achievement of increased cardiorespiratory fitness and enhanced flexibility, muscular endurance and strength shown through measurements of functional capacity and personal  statement of participant.   Increase Strength and Stamina Yes   Intervention Provide advice, education, support and counseling about physical activity/exercise needs.;Develop an individualized exercise prescription for aerobic and resistive training based on initial evaluation findings, risk stratification, comorbidities and participant's personal goals.   Expected Outcomes Achievement of increased cardiorespiratory fitness and enhanced flexibility, muscular endurance and strength shown through measurements of functional capacity and personal statement of participant.  Tobacco Cessation Yes   Number of packs per day 1/2   Intervention --  Patient says he is not interesting in quiting smoking.    Diabetes Yes   Expected Outcomes Long Term: Attainment of HbA1C < 7%.   Stress Yes   Intervention Offer individual and/or small group education and counseling on adjustment to heart disease, stress management and health-related lifestyle change. Teach and support self-help strategies.   Expected Outcomes Short Term: Participant demonstrates changes in health-related behavior, relaxation and other stress management skills, ability to obtain effective social support, and compliance with psychotropic medications if prescribed.;Long Term: Emotional wellbeing is indicated by absence of clinically significant psychosocial distress or social isolation.   Personal Goal Other Yes   Personal Goal Patient wants to feel good again.    Intervention Patient will attend cardiac rehab exercising 3 days/week and supplement at home exercising 2 days/week.    Expected Outcomes Patient will meet his personal goals.       Core Components/Risk Factors/Patient Goals Review:      Goals and Risk Factor Review    Row Name 10/28/16 1126             Core Components/Risk Factors/Patient Goals Review   Personal Goals Review Weight Management/Obesity;Increase Strength and Stamina          Core Components/Risk  Factors/Patient Goals at Discharge (Final Review):      Goals and Risk Factor Review - 10/28/16 1126      Core Components/Risk Factors/Patient Goals Review   Personal Goals Review Weight Management/Obesity;Increase Strength and Stamina      ITP Comments:     ITP Comments    Row Name 11/01/16 1512           ITP Comments Patient new to program. Has not started yet.           Comments: .ITP 30 Day REVIEW Patient new to program. To start 11/01/16.

## 2016-11-01 NOTE — ED Provider Notes (Signed)
Emergency Department Provider Note   By signing my name below, I, Vista Minkobert Ross, attest that this documentation has been prepared under the direction and in the presence of No att. providers found. Electronically signed, Vista Minkobert Ross, ED Scribe. 11/01/16. 10:01 PM.  I have reviewed the triage vital signs and the nursing notes.  HISTORY  Chief Complaint Facial Pain   HPI HPI Comments: Joseph [name] is a 45 y.o. male with Hx of arthritis, HLD, HTN, DM who presents to the Emergency Department complaining of constant left sided facial pain that started 3 days ago. Pt also reports an intermittent sharp, stabbing pain near his left ear. He further states that he has been dizzy recently, "it feels like I am spinning". Pt states that he has frequent episodes of dizziness and takes Antivert for this multiple times daily. Pt's wife states that she has noticed his balance being worse than normal over the past 3 days. He does not complain of any dental pain. No acute changes in vision or rash. He is scheduled to see ENT later this month.   Past Medical History:  Diagnosis Date  . Anxiety   . Arthritis    "knees, shoulders, hips, ankles" (07/29/2016)  . Asthma   . CAD (coronary artery disease)   . Chronic bronchitis (HCC)   . COPD (chronic obstructive pulmonary disease) (HCC)   . Depression   . GERD (gastroesophageal reflux disease)   . Headache    "about 3 times/month" (07/29/2016)  . Hyperlipidemia   . Hypertension   . Myocardial infarction 1996   "light one"  . PE (pulmonary embolism) 04/2013  . Pneumonia "several times"  . Type II diabetes mellitus [facility](HCC)     Patient Active Problem List   Diagnosis Date Noted  . Chest pain, moderate coronary artery risk 07/29/2016  . Unstable angina (HCC) 07/29/2016  . Hyperglycemia   . Type II diabetes mellitus, uncontrolled (HCC) 04/29/2015  . OSA (obstructive sleep apnea) 05/08/2013  . Acute pulmonary embolism (HCC) 04/27/2013  . CHEST  PAIN-UNSPECIFIED 11/23/2010  . Coronary atherosclerosis 09/10/2008  . Type 1 diabetes mellitus (HCC) 09/09/2008  . HYPERLIPIDEMIA 09/09/2008  . Essential hypertension 09/09/2008    Past Surgical History:  Procedure Laterality Date  . CARDIAC CATHETERIZATION  2006  . CARDIAC CATHETERIZATION  1996   "@ [facility]; when I had my heart attack"  . CARDIAC CATHETERIZATION N/A 07/29/2016   Procedure: Left Heart Cath and Coronary Angiography;  Surgeon: Runell GessJonathan J Berry, MD;  [facility];  Service: Cardiovascular;  Laterality: N/A;  . CARDIAC CATHETERIZATION N/A 07/29/2016   Procedure: Coronary Stent Intervention;  Surgeon: Runell GessJonathan J Berry, MD;  Location: [facility] IN[facility]AS[facility] CV LAB;  Service: Cardiovascular;  Laterality: N/A;  . CARPAL TUNNEL RELEASE Bilateral   . CORONARY ANGIOPLASTY WITH STENT PLACEMENT  07/29/2016  . KNEE ARTHROSCOPY Bilateral    "2 on left; 1 on the right"  . SHOULDER OPEN ROTATOR CUFF REPAIR Bilateral     Current Outpatient Rx  . Order #: [id number] Class: Historical Med  . Order #: [id number] Class: Historical Med  . Order #: [id number] Class: Historical Med  . Order #: [id number] Class: Normal  . Order #: [id number] Class: Normal  . Order #: [id number] Class: Historical Med  . Order #: [id number] Class: Historical Med  . Order #: [id number] Class: Normal  . Order #: [id number] Class: Historical Med  . Order #: [id number] Class: Historical Med  . Order #: [id number] Class: Historical Med  . Order #: [id number] Class: Historical Med  . Order #:  [id number] Class: Historical Med  . Order #: [id number] Class: Historical Med  . Order #: [id number] Class: Historical Med  . Order #: [id number] Class: Historical Med  . Order #: [id number] Class: Historical Med  . Order #: [id number] Class: Historical Med  . Order #: [id number] Class: Normal  . Order #: [id number] Class: Historical Med  . Order #: [id number] Class: Normal  . Order #: [id number] Class: Historical Med  . Order #: [id number] Class: Normal  . Order #:  [id number] Class: Historical Med  . Order #: [id number] Class: Normal  . Order #: [id number] Class: Historical Med  . Order #: [id number] Class: Historical Med  . Order #: [id number] Class: Normal  . Order #: [id number] Class: Normal    Allergies Sulfa antibiotics  Family History  Problem Relation Age of Onset  . Hypertension Brother   . Hypertension Father     Social History Social History  Substance Use Topics  . Smoking status: Current Every Day Smoker    Packs/day: 0.25    Years: 34.00    Types: Cigarettes  . Smokeless tobacco: Current User    Types: Snuff, Chew     Comment: 8/910/2017 "3 ppd before 2015"  . Alcohol use Yes     Comment: 07/29/2016 "quit drinking in 2004"    Review of Systems  Constitutional: No fever/chills Eyes: No visual changes. ENT: No sore throat. Positive left face pain with associated vertigo.  Cardiovascular: Denies chest pain. Respiratory: Denies shortness of breath. Gastrointestinal: No abdominal pain.  No nausea, no vomiting.  No diarrhea.  No constipation. Genitourinary: Negative for dysuria. Musculoskeletal: Negative for back pain. Skin: Negative for rash. Neurological: Negative for headaches, focal weakness or numbness. 10-point ROS otherwise negative.  ____________________________________________   PHYSICAL EXAM:  VITAL SIGNS: ED Triage Vitals [11/01/16 2103]  Enc Vitals Group     BP 109/74     Pulse Rate 114     Resp 18     Temp 98.2 F (36.8 C)     Temp Source Oral     SpO2 100 %     Weight (!) 332 lb (150.6 kg)     Height 6\' 5"  (1.956 m)     Pain Score 8   Constitutional: Alert and oriented. Well appearing and in no acute distress. Eyes: Conjunctivae are normal. PERRL. EOMI. Head: Atraumatic. No temporal artery tenderness.  Nose: No congestion/rhinnorhea. Mouth/Throat: Mucous membranes are moist.  Oropharynx non-erythematous. Poor dentition with no focal area concerning for abscess. No trismus. Managing oral secretions.    Neck: No stridor. No cervical spine tenderness to palpation. Cardiovascular: Normal rate, regular rhythm. Good peripheral circulation. Grossly normal heart sounds.   Respiratory: Normal respiratory effort.  No retractions. Lungs CTAB. Gastrointestinal: Soft and nontender. No distention.  Musculoskeletal: No lower extremity tenderness nor edema. No gross deformities of extremities. Neurologic:  Normal speech and language. No gross focal neurologic deficits are appreciated. Normal gait. Normal finger-to-nose testing.  Skin:  Skin is warm, dry and intact. No rash noted.  ____________________________________________   LABS (all labs ordered are listed, but only abnormal results are displayed)  Labs Reviewed  I-STAT CHEM 8, ED - Abnormal; Notable for the following:       Result Value   Sodium 134 (*)    Potassium 5.2 (*)    BUN 28 (*)    Creatinine, Ser 1.30 (*)    Glucose, Bld 364 (*)    Calcium, Ion 1.05 (*)    All other components within normal limits   ____________________________________________   PROCEDURES  Procedure(s) performed:   Procedures  None ____________________________________________   INITIAL IMPRESSION / ASSESSMENT AND PLAN / ED COURSE  Pertinent labs & imaging results that were available during my care of the patient were reviewed by me and considered in my medical decision making (see chart for details). DIAGNOSTIC STUDIES: Oxygen Saturation is 100% on RA, normal by my interpretation.  COORDINATION OF CARE: 9:59 PM-Will order lab work. Suggested follow up with ENT. Discussed treatment plan with pt at bedside and pt agreed to plan.   Patient presents to the ED for evaluation of left face/jaw pain with associated vertigo. No rash. Normal TM bilaterally. No face weakness. Pain seem to be radiating from region of TMJ. Possible joint pain vs dental pain. No evidence of zoster type rash. No vision change concerning for temporal arteritis. No cervical spine  tenderness. Patient with chronic vertigo and occasional difficulty walking. Normal gait in the ED with normal cerebellar exam. Very low suspicion for central vertigo process with concurrent chronic peripheral vertigo. Labs show low Calcium and mild hyperkalemia. Plan for oral supplementation at home and PCP follow up in the coming days. The patient is already scheduled to see ENT late this month.   At this time, I do not feel there is any life-threatening condition present. I have reviewed and discussed all results (EKG, imaging, lab, urine as appropriate), exam findings with patient. I have reviewed nursing notes and appropriate previous records.  I feel the patient is safe to be discharged home without further emergent workup. Discussed usual and customary return precautions. Patient and family (if present) verbalize understanding and are comfortable with this plan.  Patient will follow-up with their primary care provider. If they do not have a primary care provider, information for follow-up has been provided to them. All questions have been answered.  ____________________________________________  FINAL CLINICAL IMPRESSION(S) / ED DIAGNOSES  Final diagnoses:  Face pain  Chronic vertigo     MEDICATIONS GIVEN DURING THIS VISIT:  None  NEW OUTPATIENT MEDICATIONS STARTED DURING THIS VISIT:  None  I personally performed the services described in this documentation, which was scribed in my presence. The recorded information has been reviewed and is accurate.   Note:  This document was prepared using Dragon voice recognition software and may include unintentional dictation errors.  Alona BeneJoshua Braylynn Ghan, MD Emergency Medicine    Maia PlanJoshua G Jodene Polyak, MD 11/02/16 [phone]

## 2016-11-01 NOTE — Discharge Instructions (Signed)
We believe your symptoms were caused by benign vertigo.  Please read through the included information and take any prescribed medication(s).  Follow up with your doctor as listed above.  If you develop any new or worsening symptoms that concern you, including but not limited to persistent dizziness/vertigo, numbness or weakness in your arms or legs, altered mental status, persistent vomiting, or fever greater than 101, please return immediately to the Emergency Department.  

## 2016-11-01 NOTE — ED Triage Notes (Signed)
Pt reports L sided facial pain, stabbing pain in his L ear and dizziness. Pt states he feels like he is spinning.

## 2016-11-02 ENCOUNTER — Ambulatory Visit: Payer: Medicare Other | Admitting: Cardiovascular Disease

## 2016-11-03 ENCOUNTER — Encounter (HOSPITAL_COMMUNITY)
Admission: RE | Admit: 2016-11-03 | Discharge: 2016-11-03 | Disposition: A | Discharge status: Home / Self Care | Payer: Medicare Other | Source: Ambulatory Visit | Attending: Cardiovascular Disease | Admitting: Cardiovascular Disease

## 2016-11-03 DIAGNOSIS — Z955 Presence of coronary angioplasty implant and graft: Secondary | ICD-10-CM | POA: Diagnosis not present

## 2016-11-03 NOTE — Progress Notes (Signed)
Daily Session Note  Patient Details  Name: COVE HAYDON MRN: [id number] Date of Birth: 1971/12/04 Referring Provider:   Flowsheet Row CARDIAC REHAB PHASE II ORIENTATION from 10/28/2016 in [place]  Referring Provider  Dr. Gwenlyn Found      Encounter Date: 11/03/2016  Check In:     Session Check In - 11/03/16 1545      Check-In   Location [facility]   Staff Present Aundra Dubin, RN, BSN;Neema Barreira Luther Parody, BS, EP, Exercise Physiologist   Supervising physician immediately available to respond to emergencies See telemetry face sheet for immediately available MD   Medication changes reported     No   Fall or balance concerns reported    No   Warm-up and Cool-down Performed as group-led instruction   Resistance Training Performed Yes   VAD Patient? No     Pain Assessment   Currently in Pain? No/denies   Pain Score 0-No pain   Multiple Pain Sites No      Capillary Blood Glucose: No results found for this or any previous visit (from the past 24 hour(s)).   Goals Met:  Independence with exercise equipment Exercise tolerated well No report of cardiac concerns or symptoms Strength training completed today  Goals Unmet:  Not Applicable  Comments: Check out 445   Dr. Kate Sable is Medical Director for [place] and [facility].

## 2016-11-05 ENCOUNTER — Encounter (HOSPITAL_COMMUNITY): Payer: Medicare Other

## 2016-11-08 ENCOUNTER — Encounter (HOSPITAL_COMMUNITY): Payer: Medicare Other

## 2016-11-10 ENCOUNTER — Encounter (HOSPITAL_COMMUNITY): Payer: Medicare Other

## 2016-11-12 ENCOUNTER — Encounter (HOSPITAL_COMMUNITY): Payer: Medicare Other

## 2016-11-15 ENCOUNTER — Encounter (HOSPITAL_COMMUNITY): Payer: Medicare Other

## 2016-11-17 ENCOUNTER — Encounter (HOSPITAL_COMMUNITY): Payer: Medicare Other

## 2016-11-17 NOTE — Progress Notes (Signed)
Discharge Summary  Patient Details  Name: Joseph [name] W Kostelnik MRN: [id number] Date of Birth: 08-20-1971 Referring Provider:   Flowsheet Row CARDIAC REHAB PHASE [facility]ATION [facility]  Referring Provider  Dr. Allyson SabalBerry       Number of Visits: 2  Reason for Discharge:  Early Exit:  Patient stopped coming after 2 sessions. He said he had too much going on with his son's illness to continue the program.   Smoking History:  History  Smoking Status  . Current Every Day Smoker  . Packs/day: 0.25  . Years: 34.00  . Types: Cigarettes  Smokeless Tobacco  . Current User  . Types: Snuff, Chew    Comment: 8/910/2017 "3 ppd before 2015"    Diagnosis:  Status post coronary artery stent placement  ADL UCSD:   Initial Exercise Prescription:     Initial Exercise Prescription - 10/28/16 1100      Date of Initial Exercise RX and Referring Provider   Date 10/28/16   Referring Provider Dr. Bevelyn NgoBerry     NuStep   Level 2   Watts 15   Minutes 15   METs 1.9     Arm Ergometer   Level 2   Watts 15   Minutes 20   METs 1.9     Prescription Details   Frequency (times per week) 3   Duration Progress to 30 minutes of continuous aerobic without signs/symptoms of physical distress     Intensity   THRR REST +  30   THRR 40-80% of Max Heartrate 140-152-164   Ratings of Perceived Exertion 11-13   Perceived Dyspnea 0-4     Progression   Progression Continue progressive overload as per policy without signs/symptoms or physical distress.     Resistance Training   Training Prescription Yes   Weight 1   Reps 10-12      Discharge Exercise Prescription (Final Exercise Prescription Changes):   Functional Capacity:     6 Minute Walk    Row Name 10/28/16 1154         6 Minute Walk   Phase Initial     Distance 1200 feet     Walk Time 6 minutes     # of Rest Breaks 0     MPH 2.27     METS 2.74     RPE 9     Perceived Dyspnea  11     VO2  Peak 12.14     Symptoms No     Resting HR 114 bpm     Resting BP 100/70     Max Ex. HR 116 bpm     Max Ex. BP 118/78     2 Minute Post BP 110/70        Psychological, QOL, Others - Outcomes: PHQ 2/9: Depression screen PHQ 2/9 10/28/2016  Decreased Interest 1  Down, Depressed, Hopeless 2  PHQ - 2 Score 3  Altered sleeping 2  Tired, decreased energy 2  Change in appetite 2  Feeling bad or failure about yourself  3  Trouble concentrating 1  Moving slowly or fidgety/restless 1  Suicidal thoughts 0  PHQ-9 Score 14  Difficult doing work/chores Not difficult at all    Quality of Life:     Quality of Life - 10/28/16 1157      Quality of Life Scores   Health/Function Pre 7.68 %   Socioeconomic Pre 8.64 %   Psych/Spiritual Pre 8.7 %   Family  Pre 20.4 %   GLOBAL Pre 10.11 %      Personal Goals: Goals established at orientation with interventions provided to work toward goal.     Personal Goals and Risk Factors at Admission - 10/28/16 1121      Core Components/Risk Factors/Patient Goals on Admission    Weight Management Yes;Weight Loss   Intervention Weight Management/Obesity: Establish reasonable short term and long term weight goals.;Obesity: Provide education and appropriate resources to help participant work on and attain dietary goals.   Admit Weight 332 lb 11.2 oz (150.9 kg)   Goal Weight: Short Term 327 lb (148.3 kg)   Goal Weight: Long Term 317 lb (143.8 kg)   Sedentary Yes   Intervention Provide advice, education, support and counseling about physical activity/exercise needs.;Develop an individualized exercise prescription for aerobic and resistive training based on initial evaluation findings, risk stratification, comorbidities and participant's personal goals.   Expected Outcomes Achievement of increased cardiorespiratory fitness and enhanced flexibility, muscular endurance and strength shown through measurements of functional capacity and personal statement of  participant.   Increase Strength and Stamina Yes   Intervention Provide advice, education, support and counseling about physical activity/exercise needs.;Develop an individualized exercise prescription for aerobic and resistive training based on initial evaluation findings, risk stratification, comorbidities and participant's personal goals.   Expected Outcomes Achievement of increased cardiorespiratory fitness and enhanced flexibility, muscular endurance and strength shown through measurements of functional capacity and personal statement of participant.   Tobacco Cessation Yes   Number of packs per day 1/2   Intervention --  Patient says he is not interesting in quiting smoking.    Diabetes Yes   Expected Outcomes Long Term: Attainment of HbA1C < 7%.   Stress Yes   Intervention Offer individual and/or small group education and counseling on adjustment to heart disease, stress management and health-related lifestyle change. Teach and support self-help strategies.   Expected Outcomes Short Term: Participant demonstrates changes in health-related behavior, relaxation and other stress management skills, ability to obtain effective social support, and compliance with psychotropic medications if prescribed.;Long Term: Emotional wellbeing is indicated by absence of clinically significant psychosocial distress or social isolation.   Personal Goal Other Yes   Personal Goal Patient wants to feel good again.    Intervention Patient will attend cardiac rehab exercising 3 days/week and supplement at home exercising 2 days/week.    Expected Outcomes Patient will meet his personal goals.        Personal Goals Discharge:     Goals and Risk Factor Review    Row Name 10/28/16 1126             Core Components/Risk Factors/Patient Goals Review   Personal Goals Review Weight Management/Obesity;Increase Strength and Stamina          Nutrition & Weight - Outcomes:     Pre Biometrics - 10/28/16 1157       Pre Biometrics   Height 6\' 5"  (1.956 m)   Weight (!)  332 lb 14.3 oz (151 kg)   Waist Circumference 51 inches   Hip Circumference 49 inches   Waist to Hip Ratio 1.04 %   BMI (Calculated) 39.6   Triceps Skinfold 13 mm   % Body Fat 34.5 %   Grip Strength 99 kg   Flexibility 19.08 in   Single Leg Stand 2 seconds       Nutrition:   Nutrition Discharge:     Nutrition Assessments - 10/28/16 1058  MEDFICTS Scores   Pre Score 121      Education Questionnaire Score:     Knowledge Questionnaire Score - 10/28/16 1056      Knowledge Questionnaire Score   Pre Score 26/28

## 2016-11-19 ENCOUNTER — Encounter (HOSPITAL_COMMUNITY): Payer: Medicare Other

## 2016-11-22 ENCOUNTER — Encounter (HOSPITAL_COMMUNITY): Payer: Medicare Other

## 2016-11-24 ENCOUNTER — Encounter (HOSPITAL_COMMUNITY): Payer: Medicare Other

## 2016-11-26 ENCOUNTER — Encounter (HOSPITAL_COMMUNITY): Payer: Medicare Other

## 2016-11-29 ENCOUNTER — Encounter (HOSPITAL_COMMUNITY): Payer: Medicare Other

## 2016-12-01 ENCOUNTER — Encounter (HOSPITAL_COMMUNITY): Payer: Medicare Other

## 2016-12-03 ENCOUNTER — Encounter (HOSPITAL_COMMUNITY): Payer: Medicare Other

## 2016-12-03 ENCOUNTER — Ambulatory Visit: Payer: Medicare Other | Admitting: Cardiovascular Disease

## 2016-12-03 ENCOUNTER — Encounter: Payer: Self-pay | Admitting: *Deleted

## 2016-12-06 ENCOUNTER — Encounter (HOSPITAL_COMMUNITY): Payer: Medicare Other

## 2016-12-08 ENCOUNTER — Encounter (HOSPITAL_COMMUNITY): Payer: Medicare Other

## 2016-12-10 ENCOUNTER — Encounter (HOSPITAL_COMMUNITY): Payer: Medicare Other

## 2016-12-13 ENCOUNTER — Encounter (HOSPITAL_COMMUNITY): Payer: Medicare Other

## 2016-12-15 ENCOUNTER — Encounter (HOSPITAL_COMMUNITY): Payer: Medicare Other

## 2016-12-17 ENCOUNTER — Encounter (HOSPITAL_COMMUNITY): Payer: Medicare Other

## 2016-12-20 ENCOUNTER — Emergency Department (HOSPITAL_COMMUNITY)
Admission: EM | Admit: 2016-12-20 | Discharge: 2016-12-20 | Discharge status: Against Medical Advice | Payer: Medicare Other | Attending: Emergency Medicine | Admitting: Emergency Medicine

## 2016-12-20 ENCOUNTER — Encounter (HOSPITAL_COMMUNITY): Payer: Medicare Other

## 2016-12-20 ENCOUNTER — Emergency Department (HOSPITAL_COMMUNITY): Payer: Medicare Other

## 2016-12-20 ENCOUNTER — Encounter (HOSPITAL_COMMUNITY): Payer: Self-pay | Admitting: Emergency Medicine

## 2016-12-20 DIAGNOSIS — S60410A Abrasion of right index finger, initial encounter: Secondary | ICD-10-CM | POA: Insufficient documentation

## 2016-12-20 DIAGNOSIS — E119 Type 2 diabetes mellitus without complications: Secondary | ICD-10-CM | POA: Insufficient documentation

## 2016-12-20 DIAGNOSIS — J449 Chronic obstructive pulmonary disease, unspecified: Secondary | ICD-10-CM | POA: Insufficient documentation

## 2016-12-20 DIAGNOSIS — I252 Old myocardial infarction: Secondary | ICD-10-CM | POA: Diagnosis not present

## 2016-12-20 DIAGNOSIS — S6991XA Unspecified injury of right wrist, hand and finger(s), initial encounter: Secondary | ICD-10-CM | POA: Diagnosis present

## 2016-12-20 DIAGNOSIS — S60412A Abrasion of right middle finger, initial encounter: Secondary | ICD-10-CM | POA: Insufficient documentation

## 2016-12-20 DIAGNOSIS — F17210 Nicotine dependence, cigarettes, uncomplicated: Secondary | ICD-10-CM | POA: Insufficient documentation

## 2016-12-20 DIAGNOSIS — Z79899 Other long term (current) drug therapy: Secondary | ICD-10-CM | POA: Diagnosis not present

## 2016-12-20 DIAGNOSIS — R079 Chest pain, unspecified: Secondary | ICD-10-CM | POA: Insufficient documentation

## 2016-12-20 DIAGNOSIS — Y939 Activity, unspecified: Secondary | ICD-10-CM | POA: Insufficient documentation

## 2016-12-20 DIAGNOSIS — Z7982 Long term (current) use of aspirin: Secondary | ICD-10-CM | POA: Insufficient documentation

## 2016-12-20 DIAGNOSIS — I10 Essential (primary) hypertension: Secondary | ICD-10-CM | POA: Diagnosis not present

## 2016-12-20 DIAGNOSIS — J45909 Unspecified asthma, uncomplicated: Secondary | ICD-10-CM | POA: Insufficient documentation

## 2016-12-20 DIAGNOSIS — Y999 Unspecified external cause status: Secondary | ICD-10-CM | POA: Diagnosis not present

## 2016-12-20 DIAGNOSIS — W208XXA Other cause of strike by thrown, projected or falling object, initial encounter: Secondary | ICD-10-CM | POA: Diagnosis not present

## 2016-12-20 DIAGNOSIS — I2510 Atherosclerotic heart disease of native coronary artery without angina pectoris: Secondary | ICD-10-CM | POA: Insufficient documentation

## 2016-12-20 DIAGNOSIS — Y929 Unspecified place or not applicable: Secondary | ICD-10-CM | POA: Diagnosis not present

## 2016-12-20 DIAGNOSIS — Z794 Long term (current) use of insulin: Secondary | ICD-10-CM | POA: Diagnosis not present

## 2016-12-20 LAB — BASIC METABOLIC PANEL
Anion gap: 9 (ref 5–15)
BUN: 12 mg/dL (ref 6–20)
CO2: 25 mmol/L (ref 22–32)
Calcium: 9.8 mg/dL (ref 8.9–10.3)
Chloride: 100 mmol/L — ABNORMAL LOW (ref 101–111)
Creatinine, Ser: 1.22 mg/dL (ref 0.61–1.24)
GFR calc Af Amer: >60 mL/min (ref >60)
GFR calc non Af Amer: >60 mL/min (ref >60)
Glucose, Bld: 552 mg/dL (ref 65–99)
Potassium: 4.7 mmol/L (ref 3.5–5.1)
Sodium: 134 mmol/L — ABNORMAL LOW (ref 135–145)

## 2016-12-20 LAB — CBC
HCT: 48.3 % (ref 39.0–52.0)
Hemoglobin: 16.1 g/dL (ref 13.0–17.0)
MCH: 28.9 pg (ref 26.0–34.0)
MCHC: 33.3 g/dL (ref 30.0–36.0)
MCV: 86.7 fL (ref 78.0–100.0)
Platelets: 288 10*3/uL (ref 150–400)
RBC: 5.57 MIL/uL (ref 4.22–5.81)
RDW: 12.7 % (ref 11.5–15.5)
WBC: 10.6 10*3/uL — ABNORMAL HIGH (ref 4.0–10.5)

## 2016-12-20 LAB — I-STAT TROPONIN, ED: Troponin i, poc: 0 ng/mL (ref 0.00–0.08)

## 2016-12-20 MED ORDER — INSULIN ASPART 100 UNIT/ML ~~LOC~~ SOLN
10.0000 [IU] | Freq: Once | SUBCUTANEOUS | Status: DC
  Filled 2016-12-20: qty 1

## 2016-12-20 MED ORDER — SODIUM CHLORIDE 0.9 % IV SOLN: INTRAVENOUS | Status: DC

## 2016-12-20 MED ORDER — ONDANSETRON HCL 4 MG/2ML IJ SOLN
4.0000 mg | Freq: Once | INTRAMUSCULAR | Status: DC
  Filled 2016-12-20: qty 2

## 2016-12-20 MED ORDER — SODIUM CHLORIDE 0.9 % IV BOLUS (SEPSIS): 250.0000 mL | Freq: Once | INTRAVENOUS | Status: DC

## 2016-12-20 MED ORDER — HYDROMORPHONE HCL 1 MG/ML IJ SOLN
1.0000 mg | Freq: Once | INTRAMUSCULAR | Status: DC
  Filled 2016-12-20: qty 1

## 2016-12-20 NOTE — ED Notes (Signed)
Pt stated don't worry about meds I cannot stay and I need to leave. I told pt to watch his blood sugar and that his leaving could result in death due to chest pain. Pt confirmed understanding.

## 2016-12-20 NOTE — ED Triage Notes (Deleted)
Pt reports nonproductive cough with sore throat since yesterday.

## 2016-12-20 NOTE — ED Notes (Signed)
Pt states that he has a heaviness in his chest and has an injury to his right hand on the knuckle of his middle finger.

## 2016-12-20 NOTE — ED Triage Notes (Signed)
Pt states he dropped a tv on his right hand about 4pm today and then started having chest pain at 5.  Has taken NTG x 4 with no relief.

## 2016-12-20 NOTE — ED Provider Notes (Signed)
[facility] DEPT Provider Note   CSN: [id number] Arrival date & time: 12/20/16  1754     History   Chief Complaint Chief Complaint  Patient presents with  . Chest Pain    HPI Joseph [name] is a 46 y.o. male.  Patient with known coronary artery disease. His had stents. Most recently had a left heart cath in August. Last seen by cardiology in October. Patient states that he has chest pain frequently requiring nitroglycerin. However today at 5 chest pain became much more severe. Took several nitroglycerin without any benefit. Has had a baby aspirin today. Will order a regular aspirin. Patient also had a TV drop on his right hand has pain and swelling to that hand. That happened today. Patient states he has nausea but no vomiting no shortness of breath. Patient also has a history of diabetes.      Past Medical History:  Diagnosis Date  . Anxiety   . Arthritis    "knees, shoulders, hips, ankles" (07/29/2016)  . Asthma   . CAD (coronary artery disease)   . Chronic bronchitis (HCC)   . COPD (chronic obstructive pulmonary disease) (HCC)   . Depression   . GERD (gastroesophageal reflux disease)   . Headache    "about 3 times/month" (07/29/2016)  . Hyperlipidemia   . Hypertension   . Myocardial infarction 1996   "light one"  . PE (pulmonary embolism) 04/2013  . Pneumonia "several times"  . Type II diabetes mellitus [facility])     Patient Active Problem List   Diagnosis Date Noted  . Chest pain, moderate coronary artery risk 07/29/2016  . Unstable angina (HCC) 07/29/2016  . Hyperglycemia   . Type II diabetes mellitus, uncontrolled (HCC) 04/29/2015  . OSA (obstructive sleep apnea) 05/08/2013  . Acute pulmonary embolism (HCC) 04/27/2013  . CHEST PAIN-UNSPECIFIED 11/23/2010  . Coronary atherosclerosis 09/10/2008  . Type 1 diabetes mellitus (HCC) 09/09/2008  . HYPERLIPIDEMIA 09/09/2008  . Essential hypertension 09/09/2008    Past Surgical History:  Procedure Laterality  Date  . CARDIAC CATHETERIZATION  2006  . CARDIAC CATHETERIZATION  1996   "@ [facility]; when I had my heart attack"  . CARDIAC CATHETERIZATION N/A 07/29/2016   Procedure: Left Heart Cath and Coronary Angiography;  Surgeon: Runell Gess, MD;  Location: [facility] INVASIVE CV LAB;  Service: Cardiovascular;  Laterality: N/A;  . CARDIAC CATHETERIZATION N/A 07/29/2016   Procedure: Coronary Stent Intervention;  Surgeon: Runell Gess, MD;  Location: [facility] INVASIVE CV LAB;  Service: Cardiovascular;  Laterality: N/A;  . CARPAL TUNNEL RELEASE Bilateral   . CORONARY ANGIOPLASTY WITH STENT PLACEMENT  07/29/2016  . KNEE ARTHROSCOPY Bilateral    "2 on left; 1 on the right"  . SHOULDER OPEN ROTATOR CUFF REPAIR Bilateral        Home Medications    Prior to Admission medications   Medication Sig Start Date End Date Taking? Authorizing Provider  albuterol (PROAIR HFA) 108 (90 BASE) MCG/ACT inhaler Inhale 2 puffs into the lungs 4 (four) times daily.     Historical Provider, MD  albuterol (PROVENTIL) (2.5 MG/3ML) 0.083% nebulizer solution Take 2.5 mg by nebulization every 4 (four) hours as needed for wheezing or shortness of breath.    Historical Provider, MD  ALPRAZolam Prudy Feeler) 1 MG tablet Take 1-2 mg by mouth 4 (four) times daily. 2 mg every evening    Historical Provider, MD  amLODipine (NORVASC) 2.5 MG tablet Take 2.5 mg by mouth every morning.     Historical Provider,  MD  aspirin 81 MG chewable tablet Chew 1 tablet (81 mg total) by mouth daily. 07/30/16   Bhavinkumar Bhagat, PA  atorvastatin (LIPITOR) 40 MG tablet Take 1 tablet (40 mg total) by mouth daily. Patient taking differently: Take 40 mg by mouth daily at 6 PM.  07/29/15   Chilton Siiffany [place], MD  beclomethasone (QVAR) 80 MCG/ACT inhaler Inhale 2 puffs into the lungs 2 (two) times daily as needed (for wheezing or shortness of breath).    Historical Provider, MD  buPROPion (WELLBUTRIN SR) 150 MG 12 hr tablet Take 1 tablet by mouth 2 (two) times daily. 08/17/16    Historical Provider, MD  clopidogrel (PLAVIX) 75 MG tablet Take 1 tablet (75 mg total) by mouth daily with breakfast. 09/16/16   Chilton Siiffany [place], MD  fenofibrate (TRICOR) 145 MG tablet Take 145 mg by mouth every morning.     Historical Provider, MD  fluconazole (DIFLUCAN) 100 MG tablet Take 100 mg by mouth every Monday.  03/12/15   Historical Provider, MD  fluticasone (FLONASE) 50 MCG/ACT nasal spray Place 2 sprays into both nostrils daily as needed for allergies or rhinitis.    Historical Provider, MD  glucose blood (ONETOUCH VERIO) test strip Use to test blood sugar 3 times daily as instructed. Dx: E10.9 04/29/15   Reather LittlerAjay Kumar, MD  HUMALOG KWIKPEN 100 UNIT/ML SOPN Inject 35-40 Units into the skin 3 (three) times daily with meals. As directed per sliding scale 05/02/13   Historical Provider, MD  Insulin Pen Needle 32G X 4 MM MISC Use to inject insulin 4 times daily. Dx: E10.9 04/29/15   Reather LittlerAjay Kumar, MD  LANTUS SOLOSTAR 100 UNIT/ML Solostar Pen Inject 70 Units into the skin 2 (two) times daily.  12/01/14   Historical Provider, MD  lisinopril (PRINIVIL,ZESTRIL) 5 MG tablet Take 5 mg by mouth daily. 12/29/14   Historical Provider, MD  loratadine (CLARITIN) 10 MG tablet Take 10 mg by mouth daily. 10/24/16   Historical Provider, MD  LYRICA 50 MG capsule Take 50-100 mg by mouth See admin instructions. 100 mg every morning and 50 mg every evening 02/16/15   Historical Provider, MD  meclizine (ANTIVERT) 25 MG tablet Take 25 mg by mouth 3 (three) times daily as needed for dizziness. 25 mg every morning and 25 mg every evening.  May take an additional 25 mg during the day 04/10/13   Historical Provider, MD  metFORMIN (GLUCOPHAGE) 1000 MG tablet Take 1,000 mg by mouth 2 (two) times daily with a meal.      Historical Provider, MD  nitroGLYCERIN (NITROSTAT) 0.4 MG SL tablet Place 1 tablet (0.4 mg total) under the tongue every 5 (five) minutes as needed for chest pain. 09/16/16   Chilton Siiffany [place], MD  oxyCODONE (ROXICODONE)  15 MG immediate release tablet Take 15 mg by mouth 5 (five) times daily.     Historical Provider, MD  pantoprazole (PROTONIX) 40 MG tablet TAKE 2 TABLETS(80 MG) BY MOUTH DAILY 07/30/16   Azalee CourseHao Meng, PA  promethazine (PHENERGAN) 25 MG tablet Take 25 mg by mouth every 6 (six) hours as needed for nausea.     Historical Provider, MD  Rivaroxaban (XARELTO) 20 MG TABS Take 1 tablet (20 mg total) by mouth daily. Patient taking differently: Take 20 mg by mouth daily. Take with food 05/28/13   Gwenyth BenderKaren M Black, NP  tiZANidine (ZANAFLEX) 4 MG tablet Take 4 mg by mouth daily as needed for muscle spasms.  02/17/15   Historical Provider, MD  VICTOZA 18 MG/3ML SOPN  Inject 0.2 mLs (1.2 mg total) into the skin daily. Inject once daily at the same time 04/29/15   Reather Littler, MD    Family History Family History  Problem Relation Age of Onset  . Hypertension Brother   . Hypertension Father     Social History Social History  Substance Use Topics  . Smoking status: Current Every Day Smoker    Packs/day: 0.25    Years: 34.00    Types: Cigarettes  . Smokeless tobacco: Former Neurosurgeon    Types: Snuff, Chew     Comment: 8/910/2017 "3 ppd before 2015"  . Alcohol use Yes     Comment: 07/29/2016 "quit drinking in 2004"     Allergies   Sulfa antibiotics   Review of Systems Review of Systems  Constitutional: Negative for fever.  HENT: Negative for congestion.   Eyes: Negative for visual disturbance.  Respiratory: Negative for shortness of breath.   Cardiovascular: Positive for chest pain.  Gastrointestinal: Positive for nausea. Negative for abdominal pain and vomiting.  Genitourinary: Negative for dysuria.  Musculoskeletal: Negative for back pain.  Skin: Positive for wound. Negative for rash.  Neurological: Negative for headaches.  Hematological: Does not bruise/bleed easily.  Psychiatric/Behavioral: Negative for confusion.     Physical Exam Updated Vital Signs BP 136/90   Pulse 103   Temp 98.5 F (36.9  C) (Oral)   Resp 25   Ht 6\' 5"  (1.956 m)   Wt (!) 140.6 kg   SpO2 100%   BMI 36.76 kg/m   Physical Exam  Constitutional: He is oriented to person, place, and time. He appears well-developed and well-nourished. No distress.  HENT:  Head: Normocephalic and atraumatic.  Mouth/Throat: Oropharynx is clear and moist.  Eyes: EOM are normal. Pupils are equal, round, and reactive to light.  Neck: Normal range of motion. Neck supple.  Cardiovascular: Normal rate, regular rhythm and normal heart sounds.   Pulmonary/Chest: Effort normal and breath sounds normal. No respiratory distress.  Abdominal: Soft. Bowel sounds are normal. There is no tenderness.  Musculoskeletal: Normal range of motion. He exhibits edema and tenderness.  Right hand index and middle finger metacarpals with swelling tenderness superficial abrasions Refill intact radial pulse 2+  Neurological: He is alert and oriented to person, place, and time. No cranial nerve deficit or sensory deficit. He exhibits normal muscle tone. Coordination normal.  Skin: Skin is warm.  Nursing note and vitals reviewed.    ED Treatments / Results  Labs (all labs ordered are listed, but only abnormal results are displayed) Labs Reviewed  BASIC METABOLIC PANEL - Abnormal; Notable for the following:       Result Value   Sodium 134 (*)    Chloride 100 (*)    Glucose, Bld 552 (*)    All other components within normal limits  CBC - Abnormal; Notable for the following:    WBC 10.6 (*)    All other components within normal limits  I-STAT TROPOININ, ED    EKG  EKG Interpretation  Date/Time:  Monday December 20 2016 18:06:58 EST Ventricular Rate:  120 PR Interval:    QRS Duration: 101 QT Interval:  311 QTC Calculation: 440 R Axis:   -61 Text Interpretation:  Sinus tachycardia Anterolateral infarct, age indeterminate Confirmed by Orlan Aversa  MD, Jessup Ogas [phone]) on 12/20/2016 6:57:56 PM       Radiology Dg Chest 2 View  Result Date:  12/20/2016 CLINICAL DATA:  Pain EXAM: CHEST  2 VIEW COMPARISON:  None. FINDINGS: The  heart size and mediastinal contours are within normal limits. Both lungs are clear. No acute osseous abnormality. Mild stable physiologic anterior wedging of the lower thoracic spine. IMPRESSION: No active cardiopulmonary disease. Electronically Signed   By: Tollie Eth M.D.   On: 12/20/2016 19:37   Dg Hand Complete Right  Result Date: 12/20/2016 CLINICAL DATA:  Right hand pain with abrasion and swelling at the MCP joint of the third digit since 5 p.m. Dropped 85 pound television on the hand. EXAM: RIGHT HAND - COMPLETE 3+ VIEW COMPARISON:  01/09/2015 FINDINGS: There is mild dorsal soft tissue swelling at the level of the MCP articulations without underlying fracture nor bone destruction. Joint spaces are maintained. Small accessory ossicle versus old posttraumatic well corticated rounded ossification off the tip of the ulna. The carpal bones are maintained fracture. IMPRESSION: Mild soft tissue swelling over the dorsum of the hand at the level of the MCP. No acute osseous abnormality. No bone destruction is noted. Electronically Signed   By: Tollie Eth M.D.   On: 12/20/2016 19:41    Procedures Procedures (including critical care time)  Medications Ordered in ED Medications  0.9 %  sodium chloride infusion (not administered)  sodium chloride 0.9 % bolus 250 mL (not administered)  HYDROmorphone (DILAUDID) injection 1 mg (not administered)  ondansetron (ZOFRAN) injection 4 mg (not administered)  insulin aspart (novoLOG) injection 10 Units (not administered)     Initial Impression / Assessment and Plan / ED Course  I have reviewed the triage vital signs and the nursing notes.  Pertinent labs & imaging results that were available during my care of the patient were reviewed by me and considered in my medical decision making (see chart for details).  Clinical Course     Patient insisted on leaving AMA. Patient  aware of the consequences with the worsening chest pain at 5 this evening that he could have an acute cardiac event that could lead to death. Also patient has marked hyperglycemia with blood sugars in the 500 range. Patient states he is aware of the risks insisted is going to leave. Patient also had his right hand smashed by a TV that dropped on it today.  Final Clinical Impressions(s) / ED Diagnoses   Final diagnoses:  Chest pain, unspecified type  Injury of right hand, initial encounter    New Prescriptions Discharge Medication List as of 12/20/2016  7:56 PM       Vanetta Mulders, MD 12/20/16 2002

## 2016-12-22 ENCOUNTER — Encounter (HOSPITAL_COMMUNITY): Payer: Medicare Other

## 2016-12-24 ENCOUNTER — Encounter (HOSPITAL_COMMUNITY): Payer: Medicare Other

## 2016-12-24 ENCOUNTER — Ambulatory Visit: Payer: Medicare Other | Admitting: Cardiovascular Disease

## 2016-12-26 ENCOUNTER — Other Ambulatory Visit: Payer: Self-pay | Admitting: Physician Assistant

## 2016-12-27 ENCOUNTER — Encounter (HOSPITAL_COMMUNITY): Payer: Medicare Other

## 2016-12-27 NOTE — Telephone Encounter (Signed)
Please review for refill. Thanks!  

## 2016-12-29 ENCOUNTER — Encounter (HOSPITAL_COMMUNITY): Payer: Medicare Other

## 2016-12-31 ENCOUNTER — Encounter (HOSPITAL_COMMUNITY): Payer: Medicare Other

## 2017-01-03 ENCOUNTER — Encounter (HOSPITAL_COMMUNITY): Payer: Medicare Other

## 2017-01-05 ENCOUNTER — Encounter (HOSPITAL_COMMUNITY): Payer: Medicare Other

## 2017-01-07 ENCOUNTER — Encounter (HOSPITAL_COMMUNITY): Payer: Medicare Other

## 2017-01-10 ENCOUNTER — Encounter (HOSPITAL_COMMUNITY): Payer: Medicare Other

## 2017-01-12 ENCOUNTER — Encounter (HOSPITAL_COMMUNITY): Payer: Medicare Other

## 2017-01-14 ENCOUNTER — Encounter (HOSPITAL_COMMUNITY): Payer: Medicare Other

## 2017-01-17 ENCOUNTER — Encounter (HOSPITAL_COMMUNITY): Payer: Medicare Other

## 2017-01-19 ENCOUNTER — Encounter (HOSPITAL_COMMUNITY): Payer: Medicare Other

## 2017-01-21 ENCOUNTER — Encounter (HOSPITAL_COMMUNITY): Payer: Medicare Other

## 2017-02-09 ENCOUNTER — Encounter: Payer: Self-pay | Admitting: Nurse Practitioner

## 2017-02-17 ENCOUNTER — Ambulatory Visit: Payer: Medicare Other | Admitting: Physician Assistant

## 2017-02-18 ENCOUNTER — Encounter: Payer: Self-pay | Admitting: Gastroenterology

## 2017-02-21 ENCOUNTER — Ambulatory Visit: Payer: Medicare Other | Admitting: Nurse Practitioner

## 2017-02-21 ENCOUNTER — Telehealth: Payer: Self-pay

## 2017-02-21 NOTE — Telephone Encounter (Signed)
No show letter mailed to patient. 

## 2017-04-21 ENCOUNTER — Emergency Department (HOSPITAL_COMMUNITY): Payer: Medicare Other

## 2017-04-21 ENCOUNTER — Encounter (HOSPITAL_COMMUNITY): Payer: Self-pay | Admitting: Emergency Medicine

## 2017-04-21 ENCOUNTER — Emergency Department (HOSPITAL_COMMUNITY)
Admission: EM | Admit: 2017-04-21 | Discharge: 2017-04-22 | Disposition: A | Discharge status: Home / Self Care | Payer: Medicare Other | Attending: Emergency Medicine | Admitting: Emergency Medicine

## 2017-04-21 DIAGNOSIS — Z794 Long term (current) use of insulin: Secondary | ICD-10-CM | POA: Diagnosis not present

## 2017-04-21 DIAGNOSIS — J449 Chronic obstructive pulmonary disease, unspecified: Secondary | ICD-10-CM | POA: Diagnosis not present

## 2017-04-21 DIAGNOSIS — F17210 Nicotine dependence, cigarettes, uncomplicated: Secondary | ICD-10-CM | POA: Diagnosis not present

## 2017-04-21 DIAGNOSIS — I2510 Atherosclerotic heart disease of native coronary artery without angina pectoris: Secondary | ICD-10-CM | POA: Insufficient documentation

## 2017-04-21 DIAGNOSIS — I10 Essential (primary) hypertension: Secondary | ICD-10-CM | POA: Insufficient documentation

## 2017-04-21 DIAGNOSIS — E119 Type 2 diabetes mellitus without complications: Secondary | ICD-10-CM | POA: Insufficient documentation

## 2017-04-21 DIAGNOSIS — R519 Headache, unspecified: Secondary | ICD-10-CM

## 2017-04-21 DIAGNOSIS — R51 Headache: Secondary | ICD-10-CM | POA: Insufficient documentation

## 2017-04-21 DIAGNOSIS — Z79899 Other long term (current) drug therapy: Secondary | ICD-10-CM | POA: Diagnosis not present

## 2017-04-21 DIAGNOSIS — Z7982 Long term (current) use of aspirin: Secondary | ICD-10-CM | POA: Insufficient documentation

## 2017-04-21 DIAGNOSIS — J45909 Unspecified asthma, uncomplicated: Secondary | ICD-10-CM | POA: Insufficient documentation

## 2017-04-21 LAB — CBC WITH DIFFERENTIAL/PLATELET
Basophils Absolute: 0 10*3/uL (ref 0.0–0.1)
Basophils Relative: 0 %
Eosinophils Absolute: 0.2 10*3/uL (ref 0.0–0.7)
Eosinophils Relative: 1 %
HCT: 43.7 % (ref 39.0–52.0)
Hemoglobin: 15.1 g/dL (ref 13.0–17.0)
Lymphocytes Relative: 26 %
Lymphs Abs: 4.1 10*3/uL — ABNORMAL HIGH (ref 0.7–4.0)
MCH: 29 pg (ref 26.0–34.0)
MCHC: 34.6 g/dL (ref 30.0–36.0)
MCV: 84 fL (ref 78.0–100.0)
Monocytes Absolute: 1.2 10*3/uL — ABNORMAL HIGH (ref 0.1–1.0)
Monocytes Relative: 8 %
Neutro Abs: 10 10*3/uL — ABNORMAL HIGH (ref 1.7–7.7)
Neutrophils Relative %: 65 %
Platelets: 325 10*3/uL (ref 150–400)
RBC: 5.2 MIL/uL (ref 4.22–5.81)
RDW: 13.1 % (ref 11.5–15.5)
WBC: 15.5 10*3/uL — ABNORMAL HIGH (ref 4.0–10.5)

## 2017-04-21 LAB — BASIC METABOLIC PANEL
Anion gap: 10 (ref 5–15)
BUN: 29 mg/dL — ABNORMAL HIGH (ref 6–20)
CO2: 24 mmol/L (ref 22–32)
Calcium: 9.2 mg/dL (ref 8.9–10.3)
Chloride: 101 mmol/L (ref 101–111)
Creatinine, Ser: 1.15 mg/dL (ref 0.61–1.24)
GFR calc Af Amer: >60 mL/min (ref >60)
GFR calc non Af Amer: >60 mL/min (ref >60)
Glucose, Bld: 311 mg/dL — ABNORMAL HIGH (ref 65–99)
Potassium: 4.2 mmol/L (ref 3.5–5.1)
Sodium: 135 mmol/L (ref 135–145)

## 2017-04-21 MED ORDER — IOPAMIDOL (ISOVUE-370) INJECTION 76%
100.0000 mL | Freq: Once | INTRAVENOUS | Status: AC | PRN
  Administered 2017-04-22: 100 mL via INTRAVENOUS

## 2017-04-21 MED ORDER — DIPHENHYDRAMINE HCL 50 MG/ML IJ SOLN
25.0000 mg | Freq: Once | INTRAMUSCULAR | Status: AC
  Administered 2017-04-21: 25 mg via INTRAVENOUS
  Filled 2017-04-21: qty 1

## 2017-04-21 MED ORDER — SODIUM CHLORIDE 0.9 % IV BOLUS (SEPSIS)
500.0000 mL | Freq: Once | INTRAVENOUS | Status: AC
  Administered 2017-04-21: 500 mL via INTRAVENOUS

## 2017-04-21 MED ORDER — PROCHLORPERAZINE EDISYLATE 5 MG/ML IJ SOLN
10.0000 mg | Freq: Once | INTRAMUSCULAR | Status: AC
  Administered 2017-04-21: 10 mg via INTRAVENOUS
  Filled 2017-04-21: qty 2

## 2017-04-21 NOTE — ED Triage Notes (Signed)
Pt started having pain on the left side of his head 2 weeks ago, today having blurriness in peripheral vision on and off

## 2017-04-21 NOTE — ED Provider Notes (Signed)
[facility]-EMERGENCY DEPT Provider Note   CSN: [id number] Arrival date & time: 04/21/17  2116 By signing my name below, I, Levon Hedger, attest that this documentation has been prepared under the direction and in the presence of Gilda Crease, MD . Electronically Signed: Levon Hedger, Scribe. 04/21/2017. 11:35 PM.   History   Chief Complaint Chief Complaint  Patient presents with  . Headache   HPI Joseph [name] is a 46 y.o. male with a history of CAD, COPD, HTN, MI, and DM type 2 who presents to the Emergency Department complaining of intermittent, gradually worsening pain to his left lateral scalp which began two weeks ago and significantly worsened today. Pain is exacerbated by pressure and direct palpation. Per pt, he is unable to wear a hat or put his glasses on due to pain. No alleviating factors noted. Family member reports associated blurred vision to his left lateral eye onset today. No OTC treatments tried for these symptoms PTA. Seen by PCP yesterday and was dx with a sinus infection. He denies any otalgia and has no other complaints at this time.  The history is provided by the patient. No language interpreter was used.    Past Medical History:  Diagnosis Date  . Anxiety   . Arthritis    "knees, shoulders, hips, ankles" (07/29/2016)  . Asthma   . CAD (coronary artery disease)   . Chronic bronchitis (HCC)   . COPD (chronic obstructive pulmonary disease) (HCC)   . Depression   . DVT (deep venous thrombosis) (HCC)   . GERD (gastroesophageal reflux disease)   . Headache    "about 3 times/month" (07/29/2016)  . Hyperlipidemia   . Hypertension   . Myocardial infarction (HCC) 1996   "light one"  . PE (pulmonary embolism) 04/2013  . Peripheral nerve disease   . Pneumonia "several times"  . Type II diabetes mellitus [facility])     Patient Active Problem List   Diagnosis Date Noted  . CAD (coronary artery disease)   . Chest pain, moderate coronary artery risk 07/29/2016    . Unstable angina (HCC) 07/29/2016  . Hyperglycemia   . Type II diabetes mellitus, uncontrolled (HCC) 04/29/2015  . OSA (obstructive sleep apnea) 05/08/2013  . Acute pulmonary embolism (HCC) 04/27/2013  . CHEST PAIN-UNSPECIFIED 11/23/2010  . Coronary atherosclerosis 09/10/2008  . Type 1 diabetes mellitus (HCC) 09/09/2008  . HYPERLIPIDEMIA 09/09/2008  . Essential hypertension 09/09/2008    Past Surgical History:  Procedure Laterality Date  . CARDIAC CATHETERIZATION  2006  . CARDIAC CATHETERIZATION  1996   "@ [facility]; when I had my heart attack"  . CARDIAC CATHETERIZATION N/A 07/29/2016   Procedure: Left Heart Cath and Coronary Angiography;  Surgeon: Runell Gess, MD;  Location: [facility] INVASIVE CV LAB;  Service: Cardiovascular;  Laterality: N/A;  . CARDIAC CATHETERIZATION N/A 07/29/2016   Procedure: Coronary Stent Intervention;  Surgeon: Runell Gess, MD;  Location: [facility] INVASIVE CV LAB;  Service: Cardiovascular;  Laterality: N/A;  . CARPAL TUNNEL RELEASE Bilateral   . CORONARY ANGIOPLASTY WITH STENT PLACEMENT  07/29/2016  . KNEE ARTHROSCOPY Bilateral    "2 on left; 1 on the right"  . SHOULDER OPEN ROTATOR CUFF REPAIR Bilateral        Home Medications    Prior to Admission medications   Medication Sig Start Date End Date Taking? Authorizing Provider  albuterol (PROAIR HFA) 108 (90 BASE) MCG/ACT inhaler Inhale 2 puffs into the lungs every 6 (six) hours as needed for wheezing or shortness  of breath.    Yes Historical Provider, MD  ALPRAZolam Prudy Feeler(XANAX) 1 MG tablet Take 1-2 mg by mouth 4 (four) times daily. 2 mg every evening   Yes Historical Provider, MD  amLODipine (NORVASC) 2.5 MG tablet Take 2.5 mg by mouth every morning.    Yes Historical Provider, MD  aspirin 81 MG chewable tablet Chew 1 tablet (81 mg total) by mouth daily. 07/30/16  Yes Bhavinkumar Bhagat, PA  atorvastatin (LIPITOR) 40 MG tablet Take 1 tablet (40 mg total) by mouth daily. Patient taking differently: Take 40 mg by  mouth daily at 6 PM.  07/29/15  Yes Chilton Siiffany [place], MD  beclomethasone (QVAR) 80 MCG/ACT inhaler Inhale 2 puffs into the lungs 2 (two) times daily as needed (for wheezing or shortness of breath).   Yes Historical Provider, MD  buPROPion (WELLBUTRIN SR) 150 MG 12 hr tablet Take 1 tablet by mouth 2 (two) times daily. 08/17/16  Yes Historical Provider, MD  cephALEXin (KEFLEX) 500 MG capsule Take 500 mg by mouth 4 (four) times daily. 10 day course starting on 04/19/2017   Yes Historical Provider, MD  clopidogrel (PLAVIX) 75 MG tablet Take 1 tablet (75 mg total) by mouth daily with breakfast. 09/16/16  Yes Chilton Siiffany [place], MD  fenofibrate (TRICOR) 145 MG tablet Take 145 mg by mouth every morning.    Yes Historical Provider, MD  fluconazole (DIFLUCAN) 100 MG tablet Take 100 mg by mouth every Monday.  03/12/15  Yes Historical Provider, MD  fluticasone (FLONASE) 50 MCG/ACT nasal spray Place 2 sprays into both nostrils daily as needed for allergies or rhinitis.   Yes Historical Provider, MD  HUMALOG KWIKPEN 100 UNIT/ML SOPN Inject 35-40 Units into the skin 3 (three) times daily with meals. As directed per sliding scale 05/02/13  Yes Historical Provider, MD  LANTUS SOLOSTAR 100 UNIT/ML Solostar Pen Inject 70 Units into the skin 2 (two) times daily.  12/01/14  Yes Historical Provider, MD  lisinopril (PRINIVIL,ZESTRIL) 5 MG tablet Take 5 mg by mouth daily. 12/29/14  Yes Historical Provider, MD  LYRICA 50 MG capsule Take 50-100 mg by mouth See admin instructions. 100 mg every morning and 50 mg every evening 02/16/15  Yes Historical Provider, MD  meclizine (ANTIVERT) 25 MG tablet Take 25 mg by mouth 3 (three) times daily as needed for dizziness. 25 mg every morning and 25 mg every evening.  May take an additional 25 mg during the day 04/10/13  Yes Historical Provider, MD  metFORMIN (GLUCOPHAGE) 1000 MG tablet Take 1,000 mg by mouth 2 (two) times daily with a meal.     Yes Historical Provider, MD  nitroGLYCERIN (NITROSTAT)  0.4 MG SL tablet Place 1 tablet (0.4 mg total) under the tongue every 5 (five) minutes as needed for chest pain. 09/16/16  Yes Chilton Siiffany [place], MD  oxyCODONE (ROXICODONE) 15 MG immediate release tablet Take 15 mg by mouth 5 (five) times daily.    Yes Historical Provider, MD  pantoprazole (PROTONIX) 40 MG tablet TAKE 2 TABLETS(80 MG) BY MOUTH DAILY 12/27/16  Yes Chilton Siiffany , MD  promethazine (PHENERGAN) 25 MG tablet Take 25 mg by mouth every 6 (six) hours as needed for nausea.    Yes Historical Provider, MD  Rivaroxaban (XARELTO) 20 MG TABS Take 1 tablet (20 mg total) by mouth daily. Patient taking differently: Take 20 mg by mouth daily. Take with food 05/28/13  Yes Lesle ChrisKaren M Black, NP  tiZANidine (ZANAFLEX) 4 MG tablet Take 4 mg by mouth daily as needed for muscle  spasms.  02/17/15  Yes Historical Provider, MD  VICTOZA 18 MG/3ML SOPN Inject 0.2 mLs (1.2 mg total) into the skin daily. Inject once daily at the same time 04/29/15  Yes Reather Littler, MD  acyclovir (ZOVIRAX) 800 MG tablet Take 1 tablet (800 mg total) by mouth 5 (five) times daily. 04/22/17   Gilda Crease, MD  albuterol (PROVENTIL) (2.5 MG/3ML) 0.083% nebulizer solution Take 2.5 mg by nebulization every 4 (four) hours as needed for wheezing or shortness of breath.    Historical Provider, MD  glucose blood (ONETOUCH VERIO) test strip Use to test blood sugar 3 times daily as instructed. Dx: E10.9 04/29/15   Reather Littler, MD  Insulin Pen Needle 32G X 4 MM MISC Use to inject insulin 4 times daily. Dx: E10.9 04/29/15   Reather Littler, MD  traMADol (ULTRAM) 50 MG tablet Take 1 tablet (50 mg total) by mouth every 6 (six) hours as needed. 04/22/17   Gilda Crease, MD    Family History Family History  Problem Relation Age of Onset  . Hypertension Brother   . Hypertension Father   . Diabetes Other   . Hyperlipidemia Other     Social History Social History  Substance Use Topics  . Smoking status: Current Every Day Smoker    Packs/day: 1.00     Years: 34.00    Types: Cigarettes  . Smokeless tobacco: Former Neurosurgeon    Types: Snuff, Chew     Comment: 8/910/2017 "3 ppd before 2015"  . Alcohol use Yes     Comment: 07/29/2016 "quit drinking in 2004"     Allergies   Sulfa antibiotics   Review of Systems Review of Systems All systems reviewed and are negative for acute change except as noted in the HPI.  Physical Exam Updated Vital Signs BP (!) 128/91 (BP Location: Right Arm)   Pulse (!) 112   Temp 98.5 F (36.9 C) (Oral)   Resp 19   Ht 6\' 5"  (1.956 m)   Wt 269 lb (122 kg)   SpO2 100%   BMI 31.90 kg/m   Physical Exam  Constitutional: He is oriented to person, place, and time. He appears well-developed and well-nourished. No distress.  HENT:  Head: Normocephalic and atraumatic.  Right Ear: Hearing normal.  Left Ear: Hearing normal.  Nose: Nose normal.  Mouth/Throat: Oropharynx is clear and moist and mucous membranes are normal.  Exquisite tenderness to the lightest touch to the left scalp  Eyes: Conjunctivae and EOM are normal. Pupils are equal, round, and reactive to light.  Neck: Normal range of motion. Neck supple.  Cardiovascular: Regular rhythm, S1 normal and S2 normal.  Exam reveals no gallop and no friction rub.   No murmur heard. Pulmonary/Chest: Effort normal and breath sounds normal. No respiratory distress. He exhibits no tenderness.  Abdominal: Soft. Normal appearance and bowel sounds are normal. There is no hepatosplenomegaly. There is no tenderness. There is no rebound, no guarding, no tenderness at McBurney's point and negative Murphy's sign. No hernia.  Musculoskeletal: Normal range of motion.  Neurological: He is alert and oriented to person, place, and time. He has normal strength. No cranial nerve deficit or sensory deficit. Coordination normal. GCS eye subscore is 4. GCS verbal subscore is 5. GCS motor subscore is 6.  Skin: Skin is warm, dry and intact. No rash noted. No cyanosis.  Psychiatric:  He has a normal mood and affect. His speech is normal and behavior is normal. Thought content normal.  Nursing  note and vitals reviewed.  ED Treatments / Results  DIAGNOSTIC STUDIES:  Oxygen Saturation is 100% on RA, normal by my interpretation.    COORDINATION OF CARE:  11:35 PM Discussed treatment plan with pt at bedside and pt agreed to plan.   Labs (all labs ordered are listed, but only abnormal results are displayed) Labs Reviewed  CBC WITH DIFFERENTIAL/PLATELET - Abnormal; Notable for the following:       Result Value   WBC 15.5 (*)    Neutro Abs 10.0 (*)    Lymphs Abs 4.1 (*)    Monocytes Absolute 1.2 (*)    All other components within normal limits  BASIC METABOLIC PANEL - Abnormal; Notable for the following:    Glucose, Bld 311 (*)    BUN 29 (*)    All other components within normal limits    EKG  EKG Interpretation None       Radiology Ct Angio Head W Or Wo Contrast  Result Date: 04/22/2017 CLINICAL DATA:  Initial evaluation for acute left-sided headache. EXAM: CT ANGIOGRAPHY HEAD TECHNIQUE: Multidetector CT imaging of the head was performed using the standard protocol during bolus administration of intravenous contrast. Multiplanar CT image reconstructions and MIPs were obtained to evaluate the vascular anatomy. CONTRAST:  100 cc of Isovue 370. COMPARISON:  Prior CT from 08/15/2013. FINDINGS: CT HEAD Brain: Cerebral volume within normal limits for patient age. No evidence for acute intracranial hemorrhage. No findings to suggest acute large vessel territory infarct. No mass lesion, midline shift, or mass effect. Ventricles are normal in size without evidence for hydrocephalus. No extra-axial fluid collection identified. Vascular: No hyperdense vessel identified. Skull: Scalp soft tissues demonstrate no acute abnormality.Calvarium intact. Sinuses/Orbits: Globes and orbital soft tissues are within normal limits. Visualized paranasal sinuses are clear. No mastoid  effusion. CTA HEAD Anterior circulation: Distal cervical segments of the internal carotid arteries are widely patent and well opacified. Petrous segments widely patent. Scattered atheromatous plaque within the cavernous ICAs without flow-limiting stenosis. Supraclinoid segments widely patent ICA termini patent. A1 segments widely patent. Anterior communicating artery normal. Anterior cerebral arteries widely patent to their distal aspects. M1 segments widely patent without stenosis or occlusion. MCA bifurcations normal. Distal MCA branches well opacified and symmetric. Posterior circulation: Left vertebral artery dominant and widely patent to the vertebrobasilar junction. Right vertebral artery hypoplastic and largely terminates at the right PICA, although a tiny branch ascends towards the vertebrobasilar junction. Posterior inferior cerebral arteries patent proximally. Basilar artery patent to its distal aspect. Superior cerebral arteries patent bilaterally. Both of the posterior cerebral artery supplied via the basilar artery and are widely patent to their distal aspects. Venous sinuses: Patent. Anatomic variants: No significant anatomic variant. No aneurysm or vascular malformation. Delayed phase: No pathologic enhancement. IMPRESSION: 1. No acute intracranial process. 2. Mild atheromatous plaque within the carotid siphons bilaterally without flow-limiting stenosis. 3. Otherwise normal CTA of the brain. No other acute vascular abnormality identified. No aneurysm. Electronically Signed   By: Rise Mu M.D.   On: 04/22/2017 01:12    Procedures Procedures (including critical care time)  Medications Ordered in ED Medications  sodium chloride 0.9 % bolus 500 mL (0 mLs Intravenous Stopped 04/22/17 0102)  prochlorperazine (COMPAZINE) injection 10 mg (10 mg Intravenous Given 04/21/17 2359)  diphenhydrAMINE (BENADRYL) injection 25 mg (25 mg Intravenous Given 04/21/17 2359)  iopamidol (ISOVUE-370) 76 %  injection 100 mL (100 mLs Intravenous Contrast Given 04/22/17 0019)  oxyCODONE-acetaminophen (PERCOCET/ROXICET) 5-325 MG per tablet 1 tablet (1  tablet Oral Given 04/22/17 0114)     Initial Impression / Assessment and Plan / ED Course  I have reviewed the triage vital signs and the nursing notes.  Pertinent labs & imaging results that were available during my care of the patient were reviewed by me and considered in my medical decision making (see chart for details).     Patient presents to the emergency department with complaints of left-sided headache. He has noticed some discomfort when lying on the left side of his head at night for the last couple of weeks. Today, however, he's noticed severe increase in pain on the left side of his scalp. The area is exquisitely tender to touch. He cannot even wear his glasses because the frame of the glasses resting on the side of his head causes pain. Very light touch here in the ER reproduces the pain. He has normal neurologic function. This seems to be topical, perhaps neuropathic on examination. CT angiography of head was normal. Based on this imaging and normal neurologic exam, do not suspect intracranial pathology such as subarachnoid hemorrhage. Patient is afebrile, neck is supple, no meningismus. No rashes noted, but symptoms present in a manner that would be seen with zoster. Will treat with analgesia, empiric Zovirax, follow-up with primary doctor.  Final Clinical Impressions(s) / ED Diagnoses   Final diagnoses:  Acute nonintractable headache, unspecified headache type    New Prescriptions New Prescriptions   ACYCLOVIR (ZOVIRAX) 800 MG TABLET    Take 1 tablet (800 mg total) by mouth 5 (five) times daily.   TRAMADOL (ULTRAM) 50 MG TABLET    Take 1 tablet (50 mg total) by mouth every 6 (six) hours as needed.   I personally performed the services described in this documentation, which was scribed in my presence. The recorded information has been  reviewed and is accurate.    Gilda Crease, MD 04/22/17 [phone]

## 2017-04-22 DIAGNOSIS — R51 Headache: Secondary | ICD-10-CM | POA: Diagnosis not present

## 2017-04-22 MED ORDER — ACYCLOVIR 800 MG PO TABS: 800.0000 mg | ORAL_TABLET | Freq: Every day | ORAL | 0 refills | Status: DC

## 2017-04-22 MED ORDER — OXYCODONE-ACETAMINOPHEN 5-325 MG PO TABS
1.0000 | ORAL_TABLET | Freq: Once | ORAL | Status: AC
  Administered 2017-04-22: 1 via ORAL
  Filled 2017-04-22: qty 1

## 2017-04-22 MED ORDER — TRAMADOL HCL 50 MG PO TABS: 50.0000 mg | ORAL_TABLET | Freq: Four times a day (QID) | ORAL | 0 refills | Status: DC | PRN

## 2017-04-22 NOTE — ED Notes (Signed)
Pt ambulatory to waiting room. Pt verbalized understanding of discharge instructions.   

## 2017-05-19 ENCOUNTER — Encounter: Payer: Self-pay | Admitting: Internal Medicine

## 2017-07-26 ENCOUNTER — Encounter: Payer: Self-pay | Admitting: Gastroenterology

## 2017-07-27 ENCOUNTER — Ambulatory Visit (INDEPENDENT_AMBULATORY_CARE_PROVIDER_SITE_OTHER): Payer: Medicare Other | Admitting: Orthopedic Surgery

## 2017-09-09 ENCOUNTER — Ambulatory Visit (INDEPENDENT_AMBULATORY_CARE_PROVIDER_SITE_OTHER): Payer: Medicare Other

## 2017-09-09 ENCOUNTER — Ambulatory Visit (INDEPENDENT_AMBULATORY_CARE_PROVIDER_SITE_OTHER): Payer: Medicare Other | Admitting: Orthopaedic Surgery

## 2017-09-09 ENCOUNTER — Encounter (INDEPENDENT_AMBULATORY_CARE_PROVIDER_SITE_OTHER): Payer: Self-pay | Admitting: Orthopaedic Surgery

## 2017-09-09 DIAGNOSIS — L97519 Non-pressure chronic ulcer of other part of right foot with unspecified severity: Secondary | ICD-10-CM | POA: Insufficient documentation

## 2017-09-09 HISTORY — DX: Non-pressure chronic ulcer of other part of right foot with unspecified severity: L97.519

## 2017-09-09 NOTE — Progress Notes (Signed)
Office Visit Note   Patient: Joseph [name]           Date of Birth: July 12, 1971           MRN: [id number] Visit Date: 09/09/2017              Requested by: Marva Panda, NP [facility] [address] [place], [name] [address] PCP: Marva Panda, NP   Assessment & Plan: Visit Diagnoses:  1. Chronic ulcer of great toe of right foot, unspecified ulcer stage (HCC)     Plan: MRI of the left foot to evaluate for osteomyelitis and to guide treatment options. Follow-up after the MRI.  Follow-Up Instructions: Return in about 10 days (around 09/19/2017).   Orders:  Orders Placed This Encounter  Procedures  . XR Foot Complete Right  . MR FOOT LEFT W CONTRAST   No orders of the defined types were placed in this encounter.     Procedures: No procedures performed   Clinical Data: No additional findings.   Subjective: Chief Complaint  Patient presents with  . Left Great Toe - Pain, Follow-up    Patient is a 46 year old gentleman with at least 9 months of left great toe ulcer on the plantar aspect. He admits to being a poorly controlled diabetic. He endorses drainage and peripheral neuropathy and a foul-smelling odor. He has not been on antibiotics. Denies any constitutional symptoms.    Review of Systems  Constitutional: Negative.   All other systems reviewed and are negative.    Objective: Vital Signs: There were no vitals taken for this visit.  Physical Exam  Constitutional: He is oriented to person, place, and time. He appears well-developed and well-nourished.  HENT:  Head: Normocephalic and atraumatic.  Eyes: Pupils are equal, round, and reactive to light.  Neck: Neck supple.  Pulmonary/Chest: Effort normal.  Abdominal: Soft.  Musculoskeletal: Normal range of motion.  Neurological: He is alert and oriented to person, place, and time.  Skin: Skin is warm.  Psychiatric: He has a normal mood and affect. His behavior is normal.  Judgment and thought content normal.  Nursing note and vitals reviewed.   Ortho Exam Left great toe exam shows erythema and swelling of the great toe. He has a plantar wound with necrotic tissue approximately the size of a dime. This does not probe down to bone. Specialty Comments:  No specialty comments available.  Imaging: Xr Foot Complete Right  Result Date: 09/09/2017 No definitive evidence of osteomyelitis. Soft tissue ulcer plantar to the great toe    PMFS History: Patient Active Problem List   Diagnosis Date Noted  . Chronic ulcer of great toe of right foot (HCC) 09/09/2017  . CAD (coronary artery disease)   . Chest pain, moderate coronary artery risk 07/29/2016  . Unstable angina (HCC) 07/29/2016  . Hyperglycemia   . Type II diabetes mellitus, uncontrolled (HCC) 04/29/2015  . OSA (obstructive sleep apnea) 05/08/2013  . Acute pulmonary embolism (HCC) 04/27/2013  . CHEST PAIN-UNSPECIFIED 11/23/2010  . Coronary atherosclerosis 09/10/2008  . Type 1 diabetes mellitus (HCC) 09/09/2008  . HYPERLIPIDEMIA 09/09/2008  . Essential hypertension 09/09/2008   Past Medical History:  Diagnosis Date  . Anxiety   . Arthritis    "knees, shoulders, hips, ankles" (07/29/2016)  . Asthma   . CAD (coronary artery disease)   . Chronic bronchitis (HCC)   . COPD (chronic obstructive pulmonary disease) (HCC)   . Depression   . DVT (deep venous thrombosis) (HCC)   .  GERD (gastroesophageal reflux disease)   . Headache    "about 3 times/month" (07/29/2016)  . Hyperlipidemia   . Hypertension   . Myocardial infarction (HCC) 1996   "light one"  . PE (pulmonary embolism) 04/2013  . Peripheral nerve disease   . Pneumonia "several times"  . Type II diabetes mellitus (HCC)     Family History  Problem Relation Age of Onset  . Hypertension Brother   . Hypertension Father   . Diabetes Other   . Hyperlipidemia Other     Past Surgical History:  Procedure Laterality Date  . CARDIAC  CATHETERIZATION  2006  . CARDIAC CATHETERIZATION  1996   "@ [facility]; when I had my heart attack"  . CARDIAC CATHETERIZATION N/A 07/29/2016   Procedure: Left Heart Cath and Coronary Angiography;  Surgeon: Runell Gess, MD;  Location: [facility] INVASIVE CV LAB;  Service: Cardiovascular;  Laterality: N/A;  . CARDIAC CATHETERIZATION N/A 07/29/2016   Procedure: Coronary Stent Intervention;  Surgeon: Runell Gess, MD;  Location: [facility] INVASIVE CV LAB;  Service: Cardiovascular;  Laterality: N/A;  . CARPAL TUNNEL RELEASE Bilateral   . CORONARY ANGIOPLASTY WITH STENT PLACEMENT  07/29/2016  . KNEE ARTHROSCOPY Bilateral    "2 on left; 1 on the right"  . SHOULDER OPEN ROTATOR CUFF REPAIR Bilateral    Social History   Occupational History  . disabled  Unemployed   Social History Main Topics  . Smoking status: Current Every Day Smoker    Packs/day: 1.00    Years: 34.00    Types: Cigarettes  . Smokeless tobacco: Former Neurosurgeon    Types: Snuff, Chew     Comment: 8/910/2017 "3 ppd before 2015"  . Alcohol use Yes     Comment: 07/29/2016 "quit drinking in 2004"  . Drug use: No  . Sexual activity: Yes

## 2017-09-13 ENCOUNTER — Ambulatory Visit (INDEPENDENT_AMBULATORY_CARE_PROVIDER_SITE_OTHER): Payer: Medicare Other | Admitting: Orthopedic Surgery

## 2017-09-14 ENCOUNTER — Telehealth (INDEPENDENT_AMBULATORY_CARE_PROVIDER_SITE_OTHER): Payer: Self-pay | Admitting: Orthopaedic Surgery

## 2017-09-14 NOTE — Telephone Encounter (Signed)
Called [place] and they will change it

## 2017-09-14 NOTE — Telephone Encounter (Signed)
Patient called this morning stating that he talked to [facility] imaging.  They stated that the MRI order states that it is his left foot, but it needs to be changed to his right foot.  CB#[phone].  Okay to leave message on that number.  Thank you.

## 2017-09-15 ENCOUNTER — Telehealth (INDEPENDENT_AMBULATORY_CARE_PROVIDER_SITE_OTHER): Payer: Self-pay | Admitting: Radiology

## 2017-09-15 NOTE — Telephone Encounter (Signed)
FYI Patient calling triage, he is suppose to have MRI of foot, but cannot get in until 10/7. He is having foul smelling drainage that is making his own family throw up due to odor. He is having to throw away socks and shoes due to them being soiled and pungent odor. He is now having difficultly walking. He states his entire foot is now red and he can hardly walk. Offered him appointment first thing tomorrow with Dr. [name] he cannot come to [place] due to car troubles, he is going to have a neighbor drive him to [facility] ER today. He is currently not on abx treatment.

## 2017-09-16 ENCOUNTER — Inpatient Hospital Stay (HOSPITAL_COMMUNITY)
Admission: EM | Admit: 2017-09-16 | Discharge: 2017-09-23 | DRG: 255 | Disposition: A | Discharge status: Skilled Nursing Facility | Payer: Medicare Other | Attending: Internal Medicine | Admitting: Internal Medicine

## 2017-09-16 ENCOUNTER — Emergency Department (HOSPITAL_COMMUNITY): Payer: Medicare Other

## 2017-09-16 ENCOUNTER — Encounter (HOSPITAL_COMMUNITY): Payer: Self-pay | Admitting: *Deleted

## 2017-09-16 DIAGNOSIS — E1142 Type 2 diabetes mellitus with diabetic polyneuropathy: Secondary | ICD-10-CM | POA: Diagnosis present

## 2017-09-16 DIAGNOSIS — I2510 Atherosclerotic heart disease of native coronary artery without angina pectoris: Secondary | ICD-10-CM | POA: Diagnosis present

## 2017-09-16 DIAGNOSIS — F17210 Nicotine dependence, cigarettes, uncomplicated: Secondary | ICD-10-CM | POA: Diagnosis present

## 2017-09-16 DIAGNOSIS — R109 Unspecified abdominal pain: Secondary | ICD-10-CM

## 2017-09-16 DIAGNOSIS — D62 Acute posthemorrhagic anemia: Secondary | ICD-10-CM | POA: Diagnosis not present

## 2017-09-16 DIAGNOSIS — Z9119 Patient's noncompliance with other medical treatment and regimen: Secondary | ICD-10-CM

## 2017-09-16 DIAGNOSIS — M869 Osteomyelitis, unspecified: Secondary | ICD-10-CM | POA: Diagnosis present

## 2017-09-16 DIAGNOSIS — Y92230 Patient room in hospital as the place of occurrence of the external cause: Secondary | ICD-10-CM | POA: Diagnosis not present

## 2017-09-16 DIAGNOSIS — L089 Local infection of the skin and subcutaneous tissue, unspecified: Secondary | ICD-10-CM | POA: Diagnosis not present

## 2017-09-16 DIAGNOSIS — F329 Major depressive disorder, single episode, unspecified: Secondary | ICD-10-CM | POA: Diagnosis present

## 2017-09-16 DIAGNOSIS — I10 Essential (primary) hypertension: Secondary | ICD-10-CM | POA: Diagnosis present

## 2017-09-16 DIAGNOSIS — M160 Bilateral primary osteoarthritis of hip: Secondary | ICD-10-CM | POA: Diagnosis present

## 2017-09-16 DIAGNOSIS — Z7901 Long term (current) use of anticoagulants: Secondary | ICD-10-CM

## 2017-09-16 DIAGNOSIS — Z86711 Personal history of pulmonary embolism: Secondary | ICD-10-CM

## 2017-09-16 DIAGNOSIS — E11649 Type 2 diabetes mellitus with hypoglycemia without coma: Secondary | ICD-10-CM | POA: Diagnosis not present

## 2017-09-16 DIAGNOSIS — T402X5A Adverse effect of other opioids, initial encounter: Secondary | ICD-10-CM | POA: Diagnosis not present

## 2017-09-16 DIAGNOSIS — Z882 Allergy status to sulfonamides status: Secondary | ICD-10-CM

## 2017-09-16 DIAGNOSIS — I252 Old myocardial infarction: Secondary | ICD-10-CM

## 2017-09-16 DIAGNOSIS — E11621 Type 2 diabetes mellitus with foot ulcer: Secondary | ICD-10-CM | POA: Diagnosis present

## 2017-09-16 DIAGNOSIS — Z7902 Long term (current) use of antithrombotics/antiplatelets: Secondary | ICD-10-CM | POA: Diagnosis not present

## 2017-09-16 DIAGNOSIS — G8929 Other chronic pain: Secondary | ICD-10-CM | POA: Diagnosis present

## 2017-09-16 DIAGNOSIS — L03115 Cellulitis of right lower limb: Secondary | ICD-10-CM | POA: Diagnosis not present

## 2017-09-16 DIAGNOSIS — G9341 Metabolic encephalopathy: Secondary | ICD-10-CM | POA: Diagnosis not present

## 2017-09-16 DIAGNOSIS — E875 Hyperkalemia: Secondary | ICD-10-CM | POA: Diagnosis present

## 2017-09-16 DIAGNOSIS — K3184 Gastroparesis: Secondary | ICD-10-CM | POA: Diagnosis present

## 2017-09-16 DIAGNOSIS — R0902 Hypoxemia: Secondary | ICD-10-CM | POA: Diagnosis not present

## 2017-09-16 DIAGNOSIS — E1152 Type 2 diabetes mellitus with diabetic peripheral angiopathy with gangrene: Secondary | ICD-10-CM | POA: Diagnosis present

## 2017-09-16 DIAGNOSIS — M19011 Primary osteoarthritis, right shoulder: Secondary | ICD-10-CM | POA: Diagnosis present

## 2017-09-16 DIAGNOSIS — G4733 Obstructive sleep apnea (adult) (pediatric): Secondary | ICD-10-CM | POA: Diagnosis present

## 2017-09-16 DIAGNOSIS — F419 Anxiety disorder, unspecified: Secondary | ICD-10-CM | POA: Diagnosis present

## 2017-09-16 DIAGNOSIS — Z7982 Long term (current) use of aspirin: Secondary | ICD-10-CM

## 2017-09-16 DIAGNOSIS — R1010 Upper abdominal pain, unspecified: Secondary | ICD-10-CM | POA: Diagnosis not present

## 2017-09-16 DIAGNOSIS — Z794 Long term (current) use of insulin: Secondary | ICD-10-CM | POA: Diagnosis not present

## 2017-09-16 DIAGNOSIS — R634 Abnormal weight loss: Secondary | ICD-10-CM | POA: Diagnosis not present

## 2017-09-16 DIAGNOSIS — Z6831 Body mass index (BMI) 31.0-31.9, adult: Secondary | ICD-10-CM

## 2017-09-16 DIAGNOSIS — E11628 Type 2 diabetes mellitus with other skin complications: Secondary | ICD-10-CM | POA: Diagnosis present

## 2017-09-16 DIAGNOSIS — M19072 Primary osteoarthritis, left ankle and foot: Secondary | ICD-10-CM | POA: Diagnosis present

## 2017-09-16 DIAGNOSIS — E785 Hyperlipidemia, unspecified: Secondary | ICD-10-CM | POA: Diagnosis present

## 2017-09-16 DIAGNOSIS — R630 Anorexia: Secondary | ICD-10-CM | POA: Diagnosis not present

## 2017-09-16 DIAGNOSIS — M19071 Primary osteoarthritis, right ankle and foot: Secondary | ICD-10-CM | POA: Diagnosis present

## 2017-09-16 DIAGNOSIS — E1165 Type 2 diabetes mellitus with hyperglycemia: Secondary | ICD-10-CM | POA: Diagnosis present

## 2017-09-16 DIAGNOSIS — K219 Gastro-esophageal reflux disease without esophagitis: Secondary | ICD-10-CM | POA: Diagnosis present

## 2017-09-16 DIAGNOSIS — Z9989 Dependence on other enabling machines and devices: Secondary | ICD-10-CM

## 2017-09-16 DIAGNOSIS — E1169 Type 2 diabetes mellitus with other specified complication: Secondary | ICD-10-CM | POA: Diagnosis present

## 2017-09-16 DIAGNOSIS — R51 Headache: Secondary | ICD-10-CM | POA: Diagnosis present

## 2017-09-16 DIAGNOSIS — Z955 Presence of coronary angioplasty implant and graft: Secondary | ICD-10-CM

## 2017-09-16 DIAGNOSIS — E1143 Type 2 diabetes mellitus with diabetic autonomic (poly)neuropathy: Secondary | ICD-10-CM | POA: Diagnosis not present

## 2017-09-16 DIAGNOSIS — Z86718 Personal history of other venous thrombosis and embolism: Secondary | ICD-10-CM

## 2017-09-16 DIAGNOSIS — R110 Nausea: Secondary | ICD-10-CM | POA: Diagnosis not present

## 2017-09-16 DIAGNOSIS — E669 Obesity, unspecified: Secondary | ICD-10-CM | POA: Diagnosis present

## 2017-09-16 DIAGNOSIS — K269 Duodenal ulcer, unspecified as acute or chronic, without hemorrhage or perforation: Secondary | ICD-10-CM | POA: Diagnosis present

## 2017-09-16 DIAGNOSIS — Z791 Long term (current) use of non-steroidal anti-inflammatories (NSAID): Secondary | ICD-10-CM

## 2017-09-16 DIAGNOSIS — R933 Abnormal findings on diagnostic imaging of other parts of digestive tract: Secondary | ICD-10-CM | POA: Diagnosis not present

## 2017-09-16 DIAGNOSIS — M19012 Primary osteoarthritis, left shoulder: Secondary | ICD-10-CM | POA: Diagnosis present

## 2017-09-16 DIAGNOSIS — L97519 Non-pressure chronic ulcer of other part of right foot with unspecified severity: Secondary | ICD-10-CM | POA: Diagnosis not present

## 2017-09-16 DIAGNOSIS — T424X5A Adverse effect of benzodiazepines, initial encounter: Secondary | ICD-10-CM | POA: Diagnosis not present

## 2017-09-16 DIAGNOSIS — J449 Chronic obstructive pulmonary disease, unspecified: Secondary | ICD-10-CM | POA: Diagnosis present

## 2017-09-16 DIAGNOSIS — M170 Bilateral primary osteoarthritis of knee: Secondary | ICD-10-CM | POA: Diagnosis present

## 2017-09-16 DIAGNOSIS — K8020 Calculus of gallbladder without cholecystitis without obstruction: Secondary | ICD-10-CM | POA: Diagnosis present

## 2017-09-16 HISTORY — DX: Non-pressure chronic ulcer of other part of right foot with unspecified severity: L97.519

## 2017-09-16 LAB — CBC WITH DIFFERENTIAL/PLATELET
Basophils Absolute: 0.1 10*3/uL (ref 0.0–0.1)
Basophils Relative: 0 %
Eosinophils Absolute: 0.3 10*3/uL (ref 0.0–0.7)
Eosinophils Relative: 2 %
HCT: 42.4 % (ref 39.0–52.0)
Hemoglobin: 13.9 g/dL (ref 13.0–17.0)
Lymphocytes Relative: 20 %
Lymphs Abs: 2.8 10*3/uL (ref 0.7–4.0)
MCH: 28.6 pg (ref 26.0–34.0)
MCHC: 32.8 g/dL (ref 30.0–36.0)
MCV: 87.2 fL (ref 78.0–100.0)
Monocytes Absolute: 1 10*3/uL (ref 0.1–1.0)
Monocytes Relative: 7 %
Neutro Abs: 10 10*3/uL — ABNORMAL HIGH (ref 1.7–7.7)
Neutrophils Relative %: 71 %
Platelets: 336 10*3/uL (ref 150–400)
RBC: 4.86 MIL/uL (ref 4.22–5.81)
RDW: 13 % (ref 11.5–15.5)
WBC: 14 10*3/uL — ABNORMAL HIGH (ref 4.0–10.5)

## 2017-09-16 LAB — CBG MONITORING, ED: Glucose-Capillary: 222 mg/dL — ABNORMAL HIGH (ref 65–99)

## 2017-09-16 LAB — BASIC METABOLIC PANEL
Anion gap: 10 (ref 5–15)
BUN: 19 mg/dL (ref 6–20)
CO2: 25 mmol/L (ref 22–32)
Calcium: 9.5 mg/dL (ref 8.9–10.3)
Chloride: 105 mmol/L (ref 101–111)
Creatinine, Ser: 1 mg/dL (ref 0.61–1.24)
GFR calc Af Amer: >60 mL/min (ref >60)
GFR calc non Af Amer: >60 mL/min (ref >60)
Glucose, Bld: 266 mg/dL — ABNORMAL HIGH (ref 65–99)
Potassium: 5.4 mmol/L — ABNORMAL HIGH (ref 3.5–5.1)
Sodium: 140 mmol/L (ref 135–145)

## 2017-09-16 LAB — GLUCOSE, CAPILLARY: Glucose-Capillary: 158 mg/dL — ABNORMAL HIGH (ref 65–99)

## 2017-09-16 LAB — C-REACTIVE PROTEIN: CRP: 8.9 mg/dL — ABNORMAL HIGH (ref <1.0)

## 2017-09-16 LAB — SEDIMENTATION RATE: Sed Rate: 41 mm/hr — ABNORMAL HIGH (ref 0–16)

## 2017-09-16 MED ORDER — BUPROPION HCL ER (SR) 150 MG PO TB12
150.0000 mg | ORAL_TABLET | Freq: Two times a day (BID) | ORAL | Status: DC
  Administered 2017-09-16 – 2017-09-23 (×13): 150 mg via ORAL
  Filled 2017-09-16 (×13): qty 1

## 2017-09-16 MED ORDER — METRONIDAZOLE IN NACL 5-0.79 MG/ML-% IV SOLN
500.0000 mg | Freq: Three times a day (TID) | INTRAVENOUS | Status: DC
  Administered 2017-09-16 – 2017-09-22 (×16): 500 mg via INTRAVENOUS
  Filled 2017-09-16 (×18): qty 100

## 2017-09-16 MED ORDER — INSULIN ASPART 100 UNIT/ML ~~LOC~~ SOLN
0.0000 [IU] | Freq: Three times a day (TID) | SUBCUTANEOUS | Status: DC
  Administered 2017-09-17 (×2): 3 [IU] via SUBCUTANEOUS
  Administered 2017-09-18 (×2): 5 [IU] via SUBCUTANEOUS
  Administered 2017-09-19: 3 [IU] via SUBCUTANEOUS
  Administered 2017-09-19: 5 [IU] via SUBCUTANEOUS
  Administered 2017-09-19 – 2017-09-20 (×2): 8 [IU] via SUBCUTANEOUS
  Administered 2017-09-20: 11 [IU] via SUBCUTANEOUS
  Administered 2017-09-20: 2 [IU] via SUBCUTANEOUS
  Administered 2017-09-21: 3 [IU] via SUBCUTANEOUS
  Administered 2017-09-22: 8 [IU] via SUBCUTANEOUS
  Administered 2017-09-22 (×2): 5 [IU] via SUBCUTANEOUS
  Administered 2017-09-23: 8 [IU] via SUBCUTANEOUS
  Administered 2017-09-23: 5 [IU] via SUBCUTANEOUS

## 2017-09-16 MED ORDER — ASPIRIN 81 MG PO CHEW
81.0000 mg | CHEWABLE_TABLET | Freq: Every day | ORAL | Status: DC
  Administered 2017-09-16 – 2017-09-23 (×7): 81 mg via ORAL
  Filled 2017-09-16 (×7): qty 1

## 2017-09-16 MED ORDER — VANCOMYCIN HCL IN DEXTROSE 1-5 GM/200ML-% IV SOLN
1000.0000 mg | INTRAVENOUS | Status: AC
  Administered 2017-09-16: 1000 mg via INTRAVENOUS
  Filled 2017-09-16: qty 200

## 2017-09-16 MED ORDER — ALPRAZOLAM 0.5 MG PO TABS
1.0000 mg | ORAL_TABLET | Freq: Four times a day (QID) | ORAL | Status: DC
Start: 2017-09-16 — End: 2017-09-18
  Administered 2017-09-16 – 2017-09-18 (×6): 2 mg via ORAL
  Filled 2017-09-16 (×6): qty 4

## 2017-09-16 MED ORDER — DEXTROSE 5 % IV SOLN
2.0000 g | INTRAVENOUS | Status: DC
  Administered 2017-09-16 – 2017-09-20 (×5): 2 g via INTRAVENOUS
  Filled 2017-09-16 (×6): qty 2

## 2017-09-16 MED ORDER — ONDANSETRON HCL 4 MG/2ML IJ SOLN
4.0000 mg | Freq: Four times a day (QID) | INTRAMUSCULAR | Status: DC | PRN
  Administered 2017-09-16 – 2017-09-20 (×4): 4 mg via INTRAVENOUS
  Filled 2017-09-16 (×4): qty 2

## 2017-09-16 MED ORDER — ACETAMINOPHEN 325 MG PO TABS
650.0000 mg | ORAL_TABLET | Freq: Four times a day (QID) | ORAL | Status: DC | PRN
  Administered 2017-09-18 – 2017-09-22 (×4): 650 mg via ORAL
  Filled 2017-09-16 (×5): qty 2

## 2017-09-16 MED ORDER — FENOFIBRATE 160 MG PO TABS
160.0000 mg | ORAL_TABLET | Freq: Every day | ORAL | Status: DC
  Administered 2017-09-16 – 2017-09-23 (×7): 160 mg via ORAL
  Filled 2017-09-16 (×7): qty 1

## 2017-09-16 MED ORDER — GABAPENTIN 300 MG PO CAPS
300.0000 mg | ORAL_CAPSULE | Freq: Every day | ORAL | Status: DC
  Administered 2017-09-16 – 2017-09-22 (×7): 300 mg via ORAL
  Filled 2017-09-16 (×7): qty 1

## 2017-09-16 MED ORDER — PREGABALIN 50 MG PO CAPS
50.0000 mg | ORAL_CAPSULE | ORAL | Status: DC
  Filled 2017-09-16: qty 1

## 2017-09-16 MED ORDER — INSULIN GLARGINE 100 UNIT/ML ~~LOC~~ SOLN
50.0000 [IU] | Freq: Every day | SUBCUTANEOUS | Status: DC
  Administered 2017-09-16: 50 [IU] via SUBCUTANEOUS
  Filled 2017-09-16 (×2): qty 0.5

## 2017-09-16 MED ORDER — ACETAMINOPHEN 650 MG RE SUPP: 650.0000 mg | Freq: Four times a day (QID) | RECTAL | Status: DC | PRN

## 2017-09-16 MED ORDER — CLOPIDOGREL BISULFATE 75 MG PO TABS
75.0000 mg | ORAL_TABLET | Freq: Every day | ORAL | Status: DC
  Administered 2017-09-17 – 2017-09-18 (×2): 75 mg via ORAL
  Filled 2017-09-16 (×2): qty 1

## 2017-09-16 MED ORDER — VANCOMYCIN HCL IN DEXTROSE 1-5 GM/200ML-% IV SOLN
1000.0000 mg | Freq: Three times a day (TID) | INTRAVENOUS | Status: DC
  Administered 2017-09-17 – 2017-09-22 (×15): 1000 mg via INTRAVENOUS
  Filled 2017-09-16 (×18): qty 200

## 2017-09-16 MED ORDER — PIPERACILLIN-TAZOBACTAM 3.375 G IVPB 30 MIN
3.3750 g | Freq: Once | INTRAVENOUS | Status: AC
  Administered 2017-09-16: 3.375 g via INTRAVENOUS
  Filled 2017-09-16: qty 50

## 2017-09-16 MED ORDER — VANCOMYCIN HCL IN DEXTROSE 1-5 GM/200ML-% IV SOLN
1000.0000 mg | Freq: Once | INTRAVENOUS | Status: AC
  Administered 2017-09-16: 1000 mg via INTRAVENOUS
  Filled 2017-09-16: qty 200

## 2017-09-16 MED ORDER — RIVAROXABAN 20 MG PO TABS
20.0000 mg | ORAL_TABLET | Freq: Every day | ORAL | Status: DC
  Administered 2017-09-16 – 2017-09-18 (×3): 20 mg via ORAL
  Filled 2017-09-16 (×4): qty 1

## 2017-09-16 MED ORDER — SODIUM CHLORIDE 0.9 % IV SOLN
INTRAVENOUS | Status: DC
  Administered 2017-09-16: 19:00:00 via INTRAVENOUS

## 2017-09-16 MED ORDER — OXYCODONE HCL 5 MG PO TABS
15.0000 mg | ORAL_TABLET | Freq: Every day | ORAL | Status: DC
  Administered 2017-09-16 – 2017-09-23 (×32): 15 mg via ORAL
  Filled 2017-09-16 (×32): qty 3

## 2017-09-16 MED ORDER — ALBUTEROL SULFATE (2.5 MG/3ML) 0.083% IN NEBU: 2.5000 mg | INHALATION_SOLUTION | RESPIRATORY_TRACT | Status: DC | PRN

## 2017-09-16 MED ORDER — INSULIN ASPART 100 UNIT/ML ~~LOC~~ SOLN
0.0000 [IU] | Freq: Every day | SUBCUTANEOUS | Status: DC
  Administered 2017-09-18 – 2017-09-19 (×2): 2 [IU] via SUBCUTANEOUS
  Administered 2017-09-21: 3 [IU] via SUBCUTANEOUS
  Administered 2017-09-22: 5 [IU] via SUBCUTANEOUS

## 2017-09-16 MED ORDER — ATORVASTATIN CALCIUM 40 MG PO TABS
40.0000 mg | ORAL_TABLET | Freq: Every day | ORAL | Status: DC
  Administered 2017-09-17 – 2017-09-22 (×5): 40 mg via ORAL
  Filled 2017-09-16 (×5): qty 1

## 2017-09-16 MED ORDER — ONDANSETRON HCL 4 MG PO TABS: 4.0000 mg | ORAL_TABLET | Freq: Four times a day (QID) | ORAL | Status: DC | PRN

## 2017-09-16 NOTE — ED Provider Notes (Signed)
[facility]Y DEPT Provider Note   CSN: [id number] Arrival date & time: 09/16/17  1241     History   Chief Complaint Chief Complaint  Patient presents with  . Wound Infection    HPI Willett Lefeber Ferger is a 46 y.o. male.  HPI  The pt is a 46 y/o male - has hx of DM - has hx of 10 months of ulcer to the bottom of the R great toe - states he recently saw Dr. [name] for a referral and was told he needed an MRI - it has since become worsened with increased redness, swelling and foul smell - no fevers.  He has swelling of his leg as well.  He has not had the MRI which has been ordered by Dr. [name].    Past Medical History:  Diagnosis Date  . Anxiety   . Arthritis    "knees, shoulders, hips, ankles" (07/29/2016)  . Asthma   . CAD (coronary artery disease)   . Chronic bronchitis (HCC)   . COPD (chronic obstructive pulmonary disease) (HCC)   . Depression   . DVT (deep venous thrombosis) (HCC)   . GERD (gastroesophageal reflux disease)   . Headache    "about 3 times/month" (07/29/2016)  . Hyperlipidemia   . Hypertension   . Myocardial infarction (HCC) 1996   "light one"  . PE (pulmonary embolism) 04/2013  . Peripheral nerve disease   . Pneumonia "several times"  . Type II diabetes mellitus [facility])     Patient Active Problem List   Diagnosis Date Noted  . Chronic ulcer of great toe of right foot (HCC) 09/09/2017  . CAD (coronary artery disease)   . Chest pain, moderate coronary artery risk 07/29/2016  . Unstable angina (HCC) 07/29/2016  . Hyperglycemia   . Type II diabetes mellitus, uncontrolled (HCC) 04/29/2015  . OSA (obstructive sleep apnea) 05/08/2013  . Acute pulmonary embolism (HCC) 04/27/2013  . CHEST PAIN-UNSPECIFIED 11/23/2010  . Coronary atherosclerosis 09/10/2008  . Type 1 diabetes mellitus (HCC) 09/09/2008  . HYPERLIPIDEMIA 09/09/2008  . Essential hypertension 09/09/2008    Past Surgical History:  Procedure Laterality Date  . CARDIAC CATHETERIZATION  2006  .  CARDIAC CATHETERIZATION  1996   "@ [facility]; when I had my heart attack"  . CARDIAC CATHETERIZATION N/A 07/29/2016   Procedure: Left Heart Cath and Coronary Angiography;  Surgeon: Runell Gess, MD;  Location: [facility] INVAS[facility] CV LAB;  Service: Cardiovascular;  Laterality: N/A;  . CARDIAC CATHETERIZATION N/A 07/29/2016   Procedure: Coronary Stent Intervention;  Surgeon: Runell Gess, MD;  Location: [facility] INVASIVE CV LAB;  Service: Cardiovascular;  Laterality: N/A;  . CARPAL TUNNEL RELEASE Bilateral   . CORONARY ANGIOPLASTY WITH STENT PLACEMENT  07/29/2016  . KNEE ARTHROSCOPY Bilateral    "2 on left; 1 on the right"  . SHOULDER OPEN ROTATOR CUFF REPAIR Bilateral        Home Medications    Prior to Admission medications   Medication Sig Start Date End Date Taking? Authorizing Provider  acyclovir (ZOVIRAX) 800 MG tablet Take 1 tablet (800 mg total) by mouth 5 (five) times daily. 04/22/17   Gilda Crease, MD  albuterol (PROAIR HFA) 108 (90 BASE) MCG/ACT inhaler Inhale 2 puffs into the lungs every 6 (six) hours as needed for wheezing or shortness of breath.     [provider]  albuterol (PROVENTIL) (2.5 MG/3ML) 0.083% nebulizer solution Take 2.5 mg by nebulization every 4 (four) hours as needed for wheezing or shortness of  breath.    [provider]  ALPRAZolam Prudy Feeler) 1 MG tablet Take 1-2 mg by mouth 4 (four) times daily. 2 mg every evening    [provider]  amLODipine (NORVASC) 2.5 MG tablet Take 2.5 mg by mouth every morning.     [provider]  aspirin 81 MG chewable tablet Chew 1 tablet (81 mg total) by mouth daily. 07/30/16   Bhagat, Sharrell Ku, PA  atorvastatin (LIPITOR) 40 MG tablet Take 1 tablet (40 mg total) by mouth daily. Patient taking differently: Take 40 mg by mouth daily at 6 PM.  07/29/15   Chilton Si, MD  beclomethasone (QVAR) 80 MCG/ACT inhaler Inhale 2 puffs into the lungs 2 (two) times daily as needed (for wheezing or shortness  of breath).    [provider]  buPROPion (WELLBUTRIN SR) 150 MG 12 hr tablet Take 1 tablet by mouth 2 (two) times daily. 08/17/16   [provider]  cephALEXin (KEFLEX) 500 MG capsule Take 500 mg by mouth 4 (four) times daily. 10 day course starting on 04/19/2017    [provider]  clopidogrel (PLAVIX) 75 MG tablet Take 1 tablet (75 mg total) by mouth daily with breakfast. 09/16/16   Chilton Si, MD  fenofibrate (TRICOR) 145 MG tablet Take 145 mg by mouth every morning.     [provider]  fluconazole (DIFLUCAN) 100 MG tablet Take 100 mg by mouth every Monday.  03/12/15   [provider]  fluticasone (FLONASE) 50 MCG/ACT nasal spray Place 2 sprays into both nostrils daily as needed for allergies or rhinitis.    [provider]  glucose blood (ONETOUCH VERIO) test strip Use to test blood sugar 3 times daily as instructed. Dx: E10.9 04/29/15   Reather Littler, MD  HUMALOG KWIKPEN 100 UNIT/ML SOPN Inject 35-40 Units into the skin 3 (three) times daily with meals. As directed per sliding scale 05/02/13   [provider]  Insulin Pen Needle 32G X 4 MM MISC Use to inject insulin 4 times daily. Dx: E10.9 04/29/15   Reather Littler, MD  LANTUS SOLOSTAR 100 UNIT/ML Solostar Pen Inject 70 Units into the skin 2 (two) times daily.  12/01/14   [provider]  lisinopril (PRINIVIL,ZESTRIL) 5 MG tablet Take 5 mg by mouth daily. 12/29/14   [provider]  LYRICA 50 MG capsule Take 50-100 mg by mouth See admin instructions. 100 mg every morning and 50 mg every evening 02/16/15   [provider]  meclizine (ANTIVERT) 25 MG tablet Take 25 mg by mouth 3 (three) times daily as needed for dizziness. 25 mg every morning and 25 mg every evening.  May take an additional 25 mg during the day 04/10/13   [provider]  metFORMIN (GLUCOPHAGE) 1000 MG tablet Take 1,000 mg by mouth 2 (two) times daily with a meal.      [provider]  nitroGLYCERIN (NITROSTAT) 0.4 MG SL tablet Place 1 tablet (0.4 mg total) under the tongue every 5 (five) minutes as needed for chest pain. 09/16/16   Chilton Si, MD  oxyCODONE (ROXICODONE) 15 MG immediate release tablet Take 15 mg by mouth 5 (five) times daily.     [provider]  pantoprazole (PROTONIX) 40 MG tablet TAKE 2 TABLETS(80 MG) BY MOUTH DAILY 12/27/16   Chilton Si, MD  promethazine (PHENERGAN) 25 MG tablet Take 25 mg by mouth every 6 (six) hours as needed for nausea.     [provider]  Rivaroxaban Carlena Hurl)  20 MG TABS Take 1 tablet (20 mg total) by mouth daily. Patient taking differently: Take 20 mg by mouth daily. Take with food 05/28/13   Black, Lesle Chris, NP  tiZANidine (ZANAFLEX) 4 MG tablet Take 4 mg by mouth daily as needed for muscle spasms.  02/17/15   [provider]  traMADol (ULTRAM) 50 MG tablet Take 1 tablet (50 mg total) by mouth every 6 (six) hours as needed. 04/22/17   Gilda Crease, MD  VICTOZA 18 MG/3ML SOPN Inject 0.2 mLs (1.2 mg total) into the skin daily. Inject once daily at the same time 04/29/15   Reather Littler, MD    Family History Family History  Problem Relation Age of Onset  . Hypertension Brother   . Hypertension Father   . Diabetes Other   . Hyperlipidemia Other     Social History Social History  Substance Use Topics  . Smoking status: Current Every Day Smoker    Packs/day: 1.00    Years: 34.00    Types: Cigarettes  . Smokeless tobacco: Former Neurosurgeon    Types: Snuff, Chew     Comment: 8/910/2017 "3 ppd before 2015"  . Alcohol use Yes     Comment: 07/29/2016 "quit drinking in 2004"     Allergies   Sulfa antibiotics   Review of Systems Review of Systems  All other systems reviewed and are negative.    Physical Exam Updated Vital Signs BP 111/80   Pulse 97   Temp 98.2 F (36.8 C) (Oral)   Resp 18   Ht  (1.956 m)   Wt 120.2 kg (265 lb)   SpO2 100%   BMI 31.42 kg/m   Physical  Exam  Constitutional: He appears well-developed and well-nourished. No distress.  HENT:  Head: Normocephalic and atraumatic.  Mouth/Throat: Oropharynx is clear and moist. No oropharyngeal exudate.  Eyes: Pupils are equal, round, and reactive to light. Conjunctivae and EOM are normal. Right eye exhibits no discharge. Left eye exhibits no discharge. No scleral icterus.  Neck: Normal range of motion. Neck supple. No JVD present. No thyromegaly present.  Cardiovascular: Normal rate, regular rhythm, normal heart sounds and intact distal pulses.  Exam reveals no gallop and no friction rub.   No murmur heard. Pulmonary/Chest: Effort normal and breath sounds normal. No respiratory distress. He has no wheezes. He has no rales.  Abdominal: Soft. Bowel sounds are normal. He exhibits no distension and no mass. There is no tenderness.  Musculoskeletal: Normal range of motion. He exhibits tenderness. He exhibits no edema.  Has swelling of the RLE below the knee through the foot - with significant swelling of the R foot and pain with palpation of the foot - normal pulses bilaterally  Lymphadenopathy:    He has no cervical adenopathy.  Neurological: He is alert. Coordination normal.  Skin: Skin is warm and dry. No rash noted. There is erythema ( of the foot - there is a deep ulcer to the R great toe).  Psychiatric: He has a normal mood and affect. His behavior is normal.  Nursing note and vitals reviewed.    ED Treatments / Results  Labs (all labs ordered are listed, but only abnormal results are displayed) Labs Reviewed  CBC WITH DIFFERENTIAL/PLATELET - Abnormal; Notable for the following:       Result Value   WBC 14.0 (*)    Neutro Abs 10.0 (*)    All other components within normal limits  BASIC METABOLIC PANEL - Abnormal; Notable  for the following:    Potassium 5.4 (*)    Glucose, Bld 266 (*)    All other components within normal limits  CBG MONITORING, ED - Abnormal; Notable for the following:     Glucose-Capillary 222 (*)    All other components within normal limits    EKG  EKG Interpretation None       Radiology Dg Toe Great Right  Result Date: 09/16/2017 CLINICAL DATA:  Diabetes mellitus with soft tissue ulceration EXAM: RIGHT FIRST TOE:  3 VIEWS COMPARISON:  Right foot October 14, 2011 FINDINGS: Frontal, oblique, and lateral views were obtained. There is soft tissue swelling of the first digit diffusely. There is lucency along the volar aspect at the level of the medial aspect of the first IP joint measuring approximately 1.6 x 1.4 cm. There is minimal calcification within this lucent area. There is no fracture or dislocation. No erosive change or bony destruction. Joint spaces appear unremarkable. IMPRESSION: Soft tissue ulceration at the level of the first IP joint. A small amount of calcification is noted in this area of ulceration, suggesting chronicity. Diffuse soft tissue swelling first digit. No bony destruction is appreciable by radiography. No fracture or dislocation. No appreciable joint space narrowing or erosion. Electronically Signed   By: Bretta Bang III M.D.   On: 09/16/2017 13:10    Procedures Procedures (including critical care time)  Medications Ordered in ED Medications  vancomycin (VANCOCIN) IVPB 1000 mg/200 mL premix (not administered)     Initial Impression / Assessment and Plan / ED Course  I have reviewed the triage vital signs and the nursing notes.  Pertinent labs & imaging results that were available during my care of the patient were reviewed by me and considered in my medical decision making (see chart for details).    I discussed the patient's care with the hospitalist, after appreciating the x-ray, the lab work showing a leukocytosis and the spreading redness and swelling it is reasonable to admit the patient for IV antibiotics. He will likely need to have orthopedic consultation for consideration of amputation of this toe.  Hospitalist in agreement, patient will be transferred to facility with the patient's orthopedic availability.  Final Clinical Impressions(s) / ED Diagnoses   Final diagnoses:  Cellulitis of right foot    New Prescriptions New Prescriptions   No medications on file     Eber Hong, MD 09/18/17 1218

## 2017-09-16 NOTE — ED Triage Notes (Signed)
Pt with wound to right great toe for months.  Pt with diabetes and neuropathy to bilateral feet.

## 2017-09-16 NOTE — Telephone Encounter (Signed)
See message.

## 2017-09-16 NOTE — Progress Notes (Signed)
      Onalee Hua Thamara Leger,DO

## 2017-09-16 NOTE — Progress Notes (Signed)
Pharmacy Antibiotic Note  Joseph [name] is a 46 y.o. male admitted on 09/16/2017 with a diabetic/ right foot infection. Pharmacy has been consulted for Vancomycin dosing for wound infection/cellulitis. MD noted concerned about underlying osteomyelitis.  Also starting IV ceftriaxone, metronidazole.  Vancomycin 1000 mg IV x1 given today 09/16/17 @ 17:09   Plan: Give vancomycin 1000 mg IV x1 now in addition to the previous Vanc  dose already given tonight to = total  loading dose then Vancomycin 1000 mg IV q8h  Monitor renal function, renal function, steady state vancomycin trough per protocol.  Height:  (195.6 cm) Weight: 265 lb (120.2 kg) IBW/kg (Calculated) : 89.1  Temp (24hrs), Avg:98.6 F (37 C), Min:98.2 F (36.8 C), Max:98.8 F (37.1 C)   Recent Labs Lab 09/16/17 1326  WBC 14.0*  CREATININE 1.00    Estimated Creatinine Clearance: 135.3 mL/min (by C-G formula based on SCr of 1 mg/dL).    Allergies  Allergen Reactions  . Sulfa Antibiotics Other (See Comments)    headache    Antimicrobials this admission: Vancomycin 9/28>> Ceftriaxone 9/28>> Metronidazole 9/28>>  Dose adjustments this admission: n/a  Microbiology results: None pending   Thank you for allowing pharmacy to be a part of this patient's care.  Noah Delaine, RPh Clinical Pharmacist [phone] 09/16/2017 6:29 PM

## 2017-09-16 NOTE — H&P (Addendum)
History and Physical  Joseph [name] VQQ:[id number] DOB: October 16, 1971 DOA: 09/16/2017   PCP: Joseph Beals, NP   Patient coming from: Home  Chief Complaint: right foot infection  HPI:  Joseph [name] is a 46 y.o. male with medical history of coronary artery disease with history of PCI August 2017, diabetes mellitus, pulmonary embolus, tobacco abuse, essential hypertension, hyperlipidemia presented with one-week history of increasing right foot pain, swelling, and erythema about his right great toe. The patient states that he has had a chronic ulceration on the plantar surface of his right great toe for approximately 8 months. He does not recall the inciting events resulting in the initial ulceration. Nevertheless, the patient saw orthopedics, Dr. Erlinda [name], on 09/09/2017. At that time, Dr. Erlinda [name] did not feel that the patient needed emergent surgical intervention, and he ordered an MRI of the right foot which has not yet been performed. Since his visit with orthopedist on 09/09/2017, the patient has noted increasing erythema, edema, and pain emanating from his right great toe. He has subjective fevers and chills, but denies any headache, chest pain, shortness breath, coughing, hemoptysis, abdominal pain, hematochezia, melena, dysuria, hematuria.   In the emergency department, the patient was afebrile and hemodynamically stable saturating 100% on room air. WBC was 14.0. His potassium was 5.4. Otherwise his BMP was unremarkable. The patient was started on vancomycin and Zosyn.  Assessment/Plan: Diabetic foot infection of the right foot/cellulitis right foot -Concerned about underlying osteomyelitis -MRI of the right foot has been obtained--results are pending at the time of this dictation -ESR -CRP -Check ABI -Case was discussed with Orthopedics, Dr. Erlinda [name], who agreed to see patient in consult once patient is transferred to [facility]--concerned that pt likely needs amputation of hallux -start vancomycin,  ceftriaxone, metronidazole  Hyperkalemia -mild -d/c lisinopril -anticipate improvement with IVF -telemetry -EKG  Coronary artery disease -No anginal symptoms - s/p LCx PCI 07/2016 -continue ASA and plavix  Diabetes mellitus type 2, poorly controlled -Start reduced dose Lantus -NovoLog sliding scale -07/29/2016 hemoglobin A1c--11.7 -repeat HbA1C -Holding metformin and Victoza  History of pulmonary embolus and DVT -Continue rivaroxaban  Hyperlipidemia -Continue Lipitor and fenofibrate  Chronic pain syndrome -Continue home doses of Lyrica, oxycodone  Anxiety/depression -Continue home doses of Wellbutrin, Xanax  COPD/tobacco abuse -Presently stable on room air -Tobacco cessation discussed   Past Medical History:  Diagnosis Date  . Anxiety   . Arthritis    "knees, shoulders, hips, ankles" (07/29/2016)  . Asthma   . CAD (coronary artery disease)   . Chronic bronchitis ([place])   . COPD (chronic obstructive pulmonary disease) ([place])   . Depression   . DVT (deep venous thrombosis) ([place])   . GERD (gastroesophageal reflux disease)   . Headache    "about 3 times/month" (07/29/2016)  . Hyperlipidemia   . Hypertension   . Myocardial infarction ([place]) 1996   "light one"  . PE (pulmonary embolism) 04/2013  . Peripheral nerve disease   . Pneumonia "several times"  . Type II diabetes mellitus ([place])    Past Surgical History:  Procedure Laterality Date  . CARDIAC CATHETERIZATION  2006  . CARDIAC CATHETERIZATION  1996   "@ [facility]; when I had my heart attack"  . CARDIAC CATHETERIZATION N/A 07/29/2016   Procedure: Left Heart Cath and Coronary Angiography;  Surgeon: Lorretta Harp, MD;  Location: [facility];  Service: Cardiovascular;  Laterality: N/A;  . CARDIAC CATHETERIZATION N/A 07/29/2016   Procedure: Coronary Stent Intervention;  Surgeon:  Lorretta Harp, MD;  Location: [facility];  Service: Cardiovascular;  Laterality: N/A;  . CARPAL TUNNEL RELEASE  Bilateral   . CORONARY ANGIOPLASTY WITH STENT PLACEMENT  07/29/2016  . KNEE ARTHROSCOPY Bilateral    "2 on left; 1 on the right"  . SHOULDER OPEN ROTATOR CUFF REPAIR Bilateral    Social History:  reports that he has been smoking Cigarettes.  He has a 34.00 pack-year smoking history. He has quit using smokeless tobacco. His smokeless tobacco use included Snuff and Chew. He reports that he drinks alcohol. He reports that he does not use drugs.   Family History  Problem Relation Age of Onset  . Hypertension Brother   . Hypertension Father   . Diabetes Other   . Hyperlipidemia Other      Allergies  Allergen Reactions  . Sulfa Antibiotics Other (See Comments)    headache     Prior to Admission medications   Medication Sig Start Date End Date Taking? Authorizing Provider  albuterol (PROAIR HFA) 108 (90 BASE) MCG/ACT inhaler Inhale 2 puffs into the lungs every 6 (six) hours as needed for wheezing or shortness of breath.    Yes [provider]  albuterol (PROVENTIL) (2.5 MG/3ML) 0.083% nebulizer solution Take 2.5 mg by nebulization every 4 (four) hours as needed for wheezing or shortness of breath.   Yes [provider]  ALPRAZolam Duanne Moron) 1 MG tablet Take 1-2 mg by mouth 4 (four) times daily. 2 mg every evening   Yes [provider]  amLODipine (NORVASC) 2.5 MG tablet Take 2.5 mg by mouth every morning.    Yes [provider]  aspirin 81 MG chewable tablet Chew 1 tablet (81 mg total) by mouth daily. 07/30/16  Yes Bhagat, Bhavinkumar, PA  atorvastatin (LIPITOR) 40 MG tablet Take 1 tablet (40 mg total) by mouth daily. Patient taking differently: Take 40 mg by mouth daily at 6 PM.  07/29/15  Yes Skeet Latch, MD  buPROPion [facility] SR) 150 MG 12 hr tablet Take 1 tablet by mouth 2 (two) times daily. 08/17/16  Yes [provider]  clopidogrel (PLAVIX) 75 MG tablet Take 1 tablet (75 mg total) by mouth daily with breakfast. 09/16/16  Yes Skeet Latch, MD  fenofibrate (TRICOR) 145 MG tablet Take 145 mg by mouth every morning.    Yes [provider]  fluconazole (DIFLUCAN) 100 MG tablet Take 100 mg by mouth every Monday.  03/12/15  Yes [provider]  HUMALOG KWIKPEN 100 UNIT/ML SOPN Inject 35-40 Units into the skin 3 (three) times daily with meals. As directed per sliding scale 05/02/13  Yes [provider]  LANTUS SOLOSTAR 100 UNIT/ML Solostar Pen Inject 70 Units into the skin 2 (two) times daily.  12/01/14  Yes [provider]  lisinopril (PRINIVIL,ZESTRIL) 5 MG tablet Take 5 mg by mouth daily. 12/29/14  Yes [provider]  LYRICA 50 MG capsule Take 50-100 mg by mouth See admin instructions. 100 mg every morning and 50 mg every evening 02/16/15  Yes [provider]  meclizine (ANTIVERT) 25 MG tablet Take 25 mg by mouth 3 (three) times daily as needed for dizziness. 25 mg every morning and 25 mg every evening.  May take an additional 25 mg during the day 04/10/13  Yes [provider]  metFORMIN (GLUCOPHAGE) 1000 MG tablet Take 1,000 mg by mouth 2 (two) times daily with a meal.     Yes [provider]  nitroGLYCERIN (NITROSTAT) 0.4 MG  SL tablet Place 1 tablet (0.4 mg total) under the tongue every 5 (five) minutes as needed for chest pain. 09/16/16  Yes Skeet Latch, MD  oxyCODONE (ROXICODONE) 15 MG immediate release tablet Take 15 mg by mouth 5 (five) times daily.    Yes [provider]  promethazine (PHENERGAN) 25 MG tablet Take 25 mg by mouth every 6 (six) hours as needed for nausea.    Yes [provider]  Rivaroxaban (XARELTO) 20 MG TABS Take 1 tablet (20 mg total) by mouth daily. Patient taking differently: Take 20 mg by mouth daily. Take with food 05/28/13  Yes Black, Lezlie Octave, NP  diclofenac (VOLTAREN) 75 MG EC tablet diclofenac sodium 75 mg tablet,delayed release  TAKE 1 TABLET BY MOUTH EVERY DAY OR TWICE DAILY    [provider]    fluticasone (FLONASE) 50 MCG/ACT nasal spray Place 2 sprays into both nostrils daily as needed for allergies or rhinitis.    [provider]  gabapentin (NEURONTIN) 300 MG capsule gabapentin 300 mg capsule  TK 1 C PO BID FOR 30 DAYS    [provider]  ibuprofen (ADVIL,MOTRIN) 800 MG tablet ibuprofen 800 mg tablet  Take 1 tablet every 8 hours by oral route.    [provider]  lisinopril-hydrochlorothiazide (PRINZIDE,ZESTORETIC) 20-12.5 MG tablet lisinopril 20 mg-hydrochlorothiazide 12.5 mg tablet  TAKE 1 TABLET BY MOUTH DAILY    [provider]  loratadine (CLARITIN) 10 MG tablet loratadine 10 mg tablet  TK 1 T PO QD    [provider]  meloxicam (MOBIC) 15 MG tablet meloxicam 15 mg tablet    [provider]  naproxen (NAPROSYN) 500 MG tablet naproxen 500 mg tablet  Take 1 tablet twice a day by oral route for 10 days.    [provider]  neomycin-polymyxin-hydrocortisone (CORTISPORIN) 3.5-10000-1 OTIC suspension neomycin-polymyxin-hydrocort 3.5 mg-10,000 unit/mL-1 % ear drops,susp    [provider]  pantoprazole (PROTONIX) 40 MG tablet TAKE 2 TABLETS(80 MG) BY MOUTH DAILY Patient not taking: Reported on 09/16/2017 12/27/16   Skeet Latch, MD  tiZANidine (ZANAFLEX) 4 MG tablet Take 4 mg by mouth daily as needed for muscle spasms.  02/17/15   [provider]  VICTOZA 18 MG/3ML SOPN Inject 0.2 mLs (1.2 mg total) into the skin daily. Inject once daily at the same time Patient not taking: Reported on 09/16/2017 04/29/15   Elayne Snare, MD    Review of Systems:  Constitutional:  No weight loss, night sweats,  Head&Eyes: No headache.  No vision loss.  No eye pain or scotoma ENT:  No Difficulty swallowing,Tooth/dental problems,Sore throat,  No ear ache, post nasal drip,  Cardio-vascular:  No chest pain, Orthopnea, PND, swelling in lower extremities,  dizziness, palpitations  GI:  No  abdominal pain, nausea,  vomiting, diarrhea, loss of appetite, hematochezia, melena, heartburn, indigestion, Resp:  No shortness of breath with exertion or at rest. No cough. No coughing up of blood .No wheezing.No chest wall deformity  Skin:  no rash or lesions.  GU:  no dysuria, change in color of urine, no urgency or frequency. No flank pain.  Musculoskeletal:  No decreased range of motion. No back ain. p Psych:  No change in mood or affect. Neurologic: No headache, no dysesthesia, no focal weakness, no vision loss. No syncope  Physical Exam: Vitals:   09/16/17 1244 09/16/17 1323  BP: (!) 114/93 111/80  Pulse: (!) 106 97  Resp: 18 18  Temp: 98.2 F (36.8 C)  TempSrc: Oral   SpO2: 100% 100%  Weight: 120.2 kg (265 lb)   Height: 6' 5" (1.956 m)    General:  A&O x 3, NAD, nontoxic, pleasant/cooperative Head/Eye: No conjunctival hemorrhage, no icterus, [place]/AT, No nystagmus ENT:  No icterus,  No thrush, good dentition, no pharyngeal exudate Neck:  No masses, no lymphadenpathy, no bruits CV:  RRR, no rub, no gallop, no S3 Lung:  CTAB, good air movement, no wheeze, no rhonchi Abdomen: soft/NT, +BS, nondistended, no peritoneal signs Ext: dorsalis pedis and posterior tibial pulses not palpable;  See pictures of right foot below Neuro: CNII-XII intact, strength 4/5 in bilateral upper and lower extremities, no dysmetria         Labs on Admission:  Basic Metabolic Panel:  Recent Labs Lab 09/16/17 1326  NA 140  K 5.4*  CL 105  CO2 25  GLUCOSE 266*  BUN 19  CREATININE 1.00  CALCIUM 9.5   Liver Function Tests: No results for input(s): AST, ALT, ALKPHOS, BILITOT, PROT, ALBUMIN in the last 168 hours. No results for input(s): LIPASE, AMYLASE in the last 168 hours. No results for input(s): AMMONIA in the last 168 hours. CBC:  Recent Labs Lab 09/16/17 1326  WBC 14.0*  NEUTROABS 10.0*  HGB 13.9  HCT 42.4  MCV 87.2  PLT 336   Coagulation Profile: No results for input(s): INR, PROTIME  in the last 168 hours. Cardiac Enzymes: No results for input(s): CKTOTAL, CKMB, CKMBINDEX, TROPONINI in the last 168 hours. BNP: Invalid input(s): POCBNP CBG:  Recent Labs Lab 09/16/17 1247  GLUCAP 222*   Urine analysis:    Component Value Date/Time   COLORURINE YELLOW 10/27/2007 1550   APPEARANCEUR CLEAR 10/27/2007 1550   LABSPEC 1.027 10/27/2007 1550   PHURINE 5.5 10/27/2007 1550   GLUCOSEU NEGATIVE 10/27/2007 1550   HGBUR NEGATIVE 10/27/2007 1550   BILIRUBINUR NEGATIVE 10/27/2007 1550   KETONESUR NEGATIVE 10/27/2007 1550   PROTEINUR NEGATIVE 10/27/2007 1550   UROBILINOGEN 0.2 10/27/2007 1550   NITRITE NEGATIVE 10/27/2007 1550   LEUKOCYTESUR  10/27/2007 1550    NEGATIVE MICROSCOPIC NOT DONE ON URINES WITH NEGATIVE PROTEIN, BLOOD, LEUKOCYTES, NITRITE, OR GLUCOSE <1000 mg/dL.   Sepsis Labs: _0 (procalcitonin:4,lacticidven:4) )No results found for this or any previous visit (from the past 240 hour(s)).   Radiological Exams on Admission: Dg Toe Great Right  Result Date: 09/16/2017 CLINICAL DATA:  Diabetes mellitus with soft tissue ulceration EXAM: RIGHT FIRST TOE:  3 VIEWS COMPARISON:  Right foot October 14, 2011 FINDINGS: Frontal, oblique, and lateral views were obtained. There is soft tissue swelling of the first digit diffusely. There is lucency along the volar aspect at the level of the medial aspect of the first IP joint measuring approximately 1.6 x 1.4 cm. There is minimal calcification within this lucent area. There is no fracture or dislocation. No erosive change or bony destruction. Joint spaces appear unremarkable. IMPRESSION: Soft tissue ulceration at the level of the first IP joint. A small amount of calcification is noted in this area of ulceration, suggesting chronicity. Diffuse soft tissue swelling first digit. No bony destruction is appreciable by radiography. No fracture or dislocation. No appreciable joint space narrowing or erosion. Electronically Signed    By: Lowella Grip III M.D.   On: 09/16/2017 13:10    EKG: Independently reviewed. pending    Time spent:80 minutes Code Status:   FULL Family Communication:  Spouse updated on phone Disposition Plan: expect 3-4 day hospitalization Consults called: Ortho--Dr. Erlinda [name] DVT Prophylaxis: Xarelto  TAT, DAVID, DO  [facility] Pager [phone]  If 7PM-7AM, please contact night-coverage [email or web address] Password [facility] 09/16/2017, 3:47 PM

## 2017-09-16 NOTE — ED Notes (Signed)
Patient informed me that he does not have anyone to pick up his son when [facility] arrives. He states that his parents are coming to pick up his son, but has no idea how long that will be. [facility] was already paged out and one the way.  I talked with Arline Asp with [facility] and told her that there is no way the patient would be able to go because of child that is less than 44 yo.  Will call [facility] when someone picks up child.

## 2017-09-17 ENCOUNTER — Other Ambulatory Visit: Payer: Self-pay

## 2017-09-17 DIAGNOSIS — L97519 Non-pressure chronic ulcer of other part of right foot with unspecified severity: Secondary | ICD-10-CM

## 2017-09-17 LAB — CBC
HCT: 38 % — ABNORMAL LOW (ref 39.0–52.0)
Hemoglobin: 12.6 g/dL — ABNORMAL LOW (ref 13.0–17.0)
MCH: 28.2 pg (ref 26.0–34.0)
MCHC: 33.2 g/dL (ref 30.0–36.0)
MCV: 85 fL (ref 78.0–100.0)
Platelets: 321 10*3/uL (ref 150–400)
RBC: 4.47 MIL/uL (ref 4.22–5.81)
RDW: 13 % (ref 11.5–15.5)
WBC: 14.7 10*3/uL — ABNORMAL HIGH (ref 4.0–10.5)

## 2017-09-17 LAB — GLUCOSE, CAPILLARY
Glucose-Capillary: 84 mg/dL (ref 65–99)
Glucose-Capillary: 151 mg/dL — ABNORMAL HIGH (ref 65–99)
Glucose-Capillary: 181 mg/dL — ABNORMAL HIGH (ref 65–99)
Glucose-Capillary: 167 mg/dL — ABNORMAL HIGH (ref 65–99)

## 2017-09-17 LAB — COMPREHENSIVE METABOLIC PANEL
ALT: 12 U/L — ABNORMAL LOW (ref 17–63)
AST: 14 U/L — ABNORMAL LOW (ref 15–41)
Albumin: 3.1 g/dL — ABNORMAL LOW (ref 3.5–5.0)
Alkaline Phosphatase: 61 U/L (ref 38–126)
Anion gap: 7 (ref 5–15)
BUN: 16 mg/dL (ref 6–20)
CO2: 24 mmol/L (ref 22–32)
Calcium: 8.7 mg/dL — ABNORMAL LOW (ref 8.9–10.3)
Chloride: 105 mmol/L (ref 101–111)
Creatinine, Ser: 1.1 mg/dL (ref 0.61–1.24)
GFR calc Af Amer: >60 mL/min (ref >60)
GFR calc non Af Amer: >60 mL/min (ref >60)
Glucose, Bld: 114 mg/dL — ABNORMAL HIGH (ref 65–99)
Potassium: 3.8 mmol/L (ref 3.5–5.1)
Sodium: 136 mmol/L (ref 135–145)
Total Bilirubin: 0.5 mg/dL (ref 0.3–1.2)
Total Protein: 6.4 g/dL — ABNORMAL LOW (ref 6.5–8.1)

## 2017-09-17 LAB — HEMOGLOBIN A1C
Hgb A1c MFr Bld: 9.5 % — ABNORMAL HIGH (ref 4.8–5.6)
Mean Plasma Glucose: 225.95 mg/dL

## 2017-09-17 LAB — HIV ANTIBODY (ROUTINE TESTING W REFLEX): HIV Screen 4th Generation wRfx: NONREACTIVE

## 2017-09-17 MED ORDER — PREGABALIN 100 MG PO CAPS
100.0000 mg | ORAL_CAPSULE | Freq: Every day | ORAL | Status: DC
  Administered 2017-09-18 – 2017-09-23 (×6): 100 mg via ORAL
  Filled 2017-09-17 (×6): qty 1

## 2017-09-17 MED ORDER — INSULIN GLARGINE 100 UNIT/ML ~~LOC~~ SOLN
45.0000 [IU] | Freq: Every day | SUBCUTANEOUS | Status: DC
  Administered 2017-09-17: 45 [IU] via SUBCUTANEOUS
  Filled 2017-09-17 (×3): qty 0.45

## 2017-09-17 MED ORDER — PREGABALIN 50 MG PO CAPS
50.0000 mg | ORAL_CAPSULE | Freq: Every day | ORAL | Status: DC
  Administered 2017-09-17 – 2017-09-23 (×6): 50 mg via ORAL
  Filled 2017-09-17 (×5): qty 1

## 2017-09-17 NOTE — Progress Notes (Signed)
Pt went downstairs to smoke even though he was told that smoking will interfere with his healing process. Pt was offered nicotine patch but he refused it. Pt needs the "off -unit privileges" order. The doctor will be informed.  Pt was com[plaining about "a stranger" who came to his room and said that he was homeless and was in jail. The charge nurse and security were notified.

## 2017-09-17 NOTE — Consult Note (Signed)
ORTHOPAEDIC CONSULTATION  REQUESTING PHYSICIAN: Kathlen Mody, MD  Chief Complaint: Right diabetic Footulcer  HPI: Joseph [name] is a 46 y.o. male who presents with Right diabetic foot ulcer for over 6 months. I previously saw him in my office and ordered an MRI. He failed to make his MRI appointments multiple times. He presented to the ED yesterday in [place] and was transferred here for higher acuity of care. MRI was also performed.  Past Medical History:  Diagnosis Date  . Anxiety   . Arthritis    "knees, shoulders, hips, ankles" (07/29/2016)  . Asthma   . CAD (coronary artery disease)   . Chronic bronchitis (HCC)   . COPD (chronic obstructive pulmonary disease) (HCC)   . Depression   . DVT (deep venous thrombosis) (HCC)   . GERD (gastroesophageal reflux disease)   . Headache    "about 3 times/month" (07/29/2016)  . Hyperlipidemia   . Hypertension   . Myocardial infarction (HCC) 1996   "light one"  . PE (pulmonary embolism) 04/2013  . Peripheral nerve disease   . Pneumonia "several times"  . Type II diabetes mellitus (HCC)    Past Surgical History:  Procedure Laterality Date  . CARDIAC CATHETERIZATION  2006  . CARDIAC CATHETERIZATION  1996   "@ [facility]; when I had my heart attack"  . CARDIAC CATHETERIZATION N/A 07/29/2016   Procedure: Left Heart Cath and Coronary Angiography;  Surgeon: Runell Gess, MD;  Location: [facility] INVASIVE CV LAB;  Service: Cardiovascular;  Laterality: N/A;  . CARDIAC CATHETERIZATION N/A 07/29/2016   Procedure: Coronary Stent Intervention;  Surgeon: Runell Gess, MD;  Location: [facility] INVASIVE CV LAB;  Service: Cardiovascular;  Laterality: N/A;  . CARPAL TUNNEL RELEASE Bilateral   . CORONARY ANGIOPLASTY WITH STENT PLACEMENT  07/29/2016  . KNEE ARTHROSCOPY Bilateral    "2 on left; 1 on the right"  . SHOULDER OPEN ROTATOR CUFF REPAIR Bilateral    Social History   Social History  . Marital status: Married    Spouse name: N/A  . Number of  children: N/A  . Years of education: N/A   Occupational History  . disabled  Unemployed   Social History Main Topics  . Smoking status: Current Every Day Smoker    Packs/day: 1.00    Years: 34.00    Types: Cigarettes  . Smokeless tobacco: Former Neurosurgeon    Types: Snuff, Chew     Comment: 8/910/2017 "3 ppd before 2015"  . Alcohol use Yes     Comment: 07/29/2016 "quit drinking in 2004"  . Drug use: No  . Sexual activity: Yes   Other Topics Concern  . None   Social History Narrative  . None   Family History  Problem Relation Age of Onset  . Hypertension Brother   . Hypertension Father   . Diabetes Other   . Hyperlipidemia Other    - negative except otherwise stated in the family history section Allergies  Allergen Reactions  . Sulfa Antibiotics Other (See Comments)    headache   Prior to Admission medications   Medication Sig Start Date End Date Taking? Authorizing Provider  albuterol (PROAIR HFA) 108 (90 BASE) MCG/ACT inhaler Inhale 2 puffs into the lungs every 6 (six) hours as needed for wheezing or shortness of breath.    Yes [provider]  albuterol (PROVENTIL) (2.5 MG/3ML) 0.083% nebulizer solution Take 2.5 mg by nebulization every 4 (four) hours as needed for wheezing or shortness of breath.   Yes  [provider]  ALPRAZolam Prudy Feeler) 1 MG tablet Take 1-2 mg by mouth 4 (four) times daily. 2 mg every evening   Yes [provider]  amLODipine (NORVASC) 2.5 MG tablet Take 2.5 mg by mouth every morning.    Yes [provider]  aspirin 81 MG chewable tablet Chew 1 tablet (81 mg total) by mouth daily. 07/30/16  Yes Bhagat, Bhavinkumar, PA  atorvastatin (LIPITOR) 40 MG tablet Take 1 tablet (40 mg total) by mouth daily. Patient taking differently: Take 40 mg by mouth daily at 6 PM.  07/29/15  Yes Chilton Si, MD  buPROPion [facility] SR) 150 MG 12 hr tablet Take 1 tablet by mouth 2 (two) times daily. 08/17/16  Yes [provider]    clopidogrel (PLAVIX) 75 MG tablet Take 1 tablet (75 mg total) by mouth daily with breakfast. 09/16/16  Yes Chilton Si, MD  diclofenac (VOLTAREN) 75 MG EC tablet diclofenac sodium 75 mg tablet,delayed release  TAKE 1 TABLET BY MOUTH EVERY DAY OR TWICE DAILY   Yes [provider]  fenofibrate (TRICOR) 145 MG tablet Take 145 mg by mouth every morning.    Yes [provider]  fluconazole (DIFLUCAN) 100 MG tablet Take 100 mg by mouth every Monday.  03/12/15  Yes [provider]  gabapentin (NEURONTIN) 300 MG capsule gabapentin 300 mg capsule  TK 1 C PO BID FOR 30 DAYS   Yes [provider]  HUMALOG KWIKPEN 100 UNIT/ML SOPN Inject 35-40 Units into the skin 3 (three) times daily with meals. As directed per sliding scale 05/02/13  Yes [provider]  ibuprofen (ADVIL,MOTRIN) 800 MG tablet ibuprofen 800 mg tablet  Take 1 tablet every 8 hours by oral route.   Yes [provider]  LANTUS SOLOSTAR 100 UNIT/ML Solostar Pen Inject 70 Units into the skin 2 (two) times daily.  12/01/14  Yes [provider]  lisinopril (PRINIVIL,ZESTRIL) 5 MG tablet Take 5 mg by mouth daily. 12/29/14  Yes [provider]  lisinopril-hydrochlorothiazide (PRINZIDE,ZESTORETIC) 20-12.5 MG tablet lisinopril 20 mg-hydrochlorothiazide 12.5 mg tablet  TAKE 1 TABLET BY MOUTH DAILY   Yes [provider]  loratadine (CLARITIN) 10 MG tablet loratadine 10 mg tablet  TK 1 T PO QD   Yes [provider]  LYRICA 50 MG capsule Take 50-100 mg by mouth See admin instructions. 100 mg every morning and 50 mg every evening 02/16/15  Yes [provider]  meclizine (ANTIVERT) 25 MG tablet Take 25 mg by mouth 3 (three) times daily as needed for dizziness. 25 mg every morning and 25 mg every evening.  May take an additional 25 mg during the day 04/10/13  Yes [provider]  meloxicam (MOBIC) 15 MG tablet meloxicam 15 mg tablet   Yes [provider]  metFORMIN (GLUCOPHAGE) 1000 MG tablet Take 1,000 mg by mouth 2 (two) times daily with a meal.     Yes [provider]  naproxen (NAPROSYN) 500 MG tablet naproxen 500 mg tablet  Take 1 tablet twice a day by oral route for 10 days.   Yes [provider]  neomycin-polymyxin-hydrocortisone (CORTISPORIN) 3.5-10000-1 OTIC suspension neomycin-polymyxin-hydrocort 3.5 mg-10,000 unit/mL-1 % ear drops,susp   Yes [provider]  nitroGLYCERIN (NITROSTAT) 0.4 MG SL tablet Place 1 tablet (0.4 mg total) under the tongue every 5 (five) minutes as needed for chest pain. 09/16/16  Yes Chilton Si, MD  oxyCODONE (ROXICODONE) 15 MG immediate release tablet Take 15 mg by mouth 5 (five)  times daily.    Yes [provider]  promethazine (PHENERGAN) 25 MG tablet Take 25 mg by mouth every 6 (six) hours as needed for nausea.    Yes [provider]  Rivaroxaban (XARELTO) 20 MG TABS Take 1 tablet (20 mg total) by mouth daily. Patient taking differently: Take 20 mg by mouth daily. Take with food 05/28/13  Yes Black, Lesle Chris, NP  VICTOZA 18 MG/3ML SOPN Inject 0.2 mLs (1.2 mg total) into the skin daily. Inject once daily at the same time 04/29/15  Yes Reather Littler, MD  fluticasone [facility]) 50 MCG/ACT nasal spray Place 2 sprays into both nostrils daily as needed for allergies or rhinitis.    [provider]  pantoprazole (PROTONIX) 40 MG tablet TAKE 2 TABLETS(80 MG) BY MOUTH DAILY Patient not taking: Reported on 09/16/2017 12/27/16   Chilton Si, MD  tiZANidine (ZANAFLEX) 4 MG tablet Take 4 mg by mouth daily as needed for muscle spasms.  02/17/15   [provider]   Joseph [name]  Result Date: 09/16/2017 CLINICAL DATA:  Right great toe wound for months. History of diabetes and neuropathy. EXAM: MRI OF THE RIGHT FOREFOOT WITHOUT [name] TECHNIQUE: Multiplanar, multisequence Joseph imaging of the right forefoot was performed. No intravenous  [name] was administered. COMPARISON:  Radiographs 09/16/2017 and 02/16/2011. FINDINGS: Despite efforts by the technologist and patient, mild motion artifact is present on today's exam and could not be eliminated. This reduces exam sensitivity and specificity. In addition, there is heterogeneous fat saturation in the toes on the T2 weighted images. Bones/Joint/Cartilage As above, there is heterogeneous fat saturation in the toes on the T2 weighted images. Sagittal inversion recovery images demonstrate T2 marrow hyperintensity in the distal phalanx of the great toe. There is mild joint space narrowing at the interphalangeal joint of the great toe with mildly decreased subchondral signal on coronal T1 weighted images. No cortical destruction is identified. There is no significant interphalangeal joint effusion. The additional phalanges and metatarsals appear normal. The tibial sesamoid of the first metatarsal is bipartite. Ligaments The Lisfranc ligament is intact. Muscles and Tendons The forefoot muscles and tendons appear intact. No significant tenosynovitis. Probable mild forefoot myopathy. Soft tissues In correlation with recent radiographs, there is soft tissue ulceration along plantar aspect of the interphalangeal joint of the great toe. No drainable fluid collection is seen. There is mild subcutaneous edema throughout the visualized forefoot. IMPRESSION: 1. Soft tissue ulceration along the plantar aspect of the great toe at the level of the interphalangeal joint. No evidence of soft tissue abscess. 2. T2 marrow hyperintensity within the distal phalanx of the great toe is concerning for early osteomyelitis given the proximity to the adjacent soft tissue wound. No cortical destruction identified on T1 weighted images. Electronically Signed   By: Carey Bullocks M.D.   On: 09/16/2017 15:49   Dg Toe Great Right  Result Date: 09/16/2017 CLINICAL DATA:  Diabetes mellitus with soft tissue ulceration EXAM: RIGHT  FIRST TOE:  3 VIEWS COMPARISON:  Right foot October 14, 2011 FINDINGS: Frontal, oblique, and lateral views were obtained. There is soft tissue swelling of the first digit diffusely. There is lucency along the volar aspect at the level of the medial aspect of the first IP joint measuring approximately 1.6 x 1.4 cm. There is minimal calcification within this lucent area. There is no fracture or dislocation. No erosive change or bony destruction. Joint spaces appear unremarkable. IMPRESSION: Soft tissue ulceration at the level of the first  IP joint. A small amount of calcification is noted in this area of ulceration, suggesting chronicity. Diffuse soft tissue swelling first digit. No bony destruction is appreciable by radiography. No fracture or dislocation. No appreciable joint space narrowing or erosion. Electronically Signed   By: Bretta Bang III M.D.   On: 09/16/2017 13:10   - pertinent xrays, CT, MRI studies were reviewed and independently interpreted  Positive ROS: All other systems have been reviewed and were otherwise negative with the exception of those mentioned in the HPI and as above.  Physical Exam: General: Alert, no acute distress Cardiovascular: No pedal edema Respiratory: No cyanosis, no use of accessory musculature GI: No organomegaly, abdomen is soft and non-tender Skin: No lesions in the area of chief complaint Neurologic: Sensation intact distally Psychiatric: Patient is competent for consent with normal mood and affect Lymphatic: No axillary or cervical lymphadenopathy  MUSCULOSKELETAL:  Foul-smelling right great toe plantar ulcer with necrotic black gangrenous tissue. There is surrounding cellulitis of the great toe streaking proximally. The foot is warm and well-perfused.  Assessment: Right great toe diabetic foot ulcer  Plan: MRI shows evidence of osteomyelitis of the great toe. Patient is a noncompliant diabetic. Patient at this point needs a partial right great  toe amputation. I would like him to continue to get IV antibiotics to help decrease the infectious burden. The plan is to take him to surgery on Wednesday for a right great toe amputation. This was all discussed with the patient and his wife and they are in agreement.  Thank you for the consult and the opportunity to see Joseph. Joseph [name]. Glee Arvin, MD [facility] [phone] 1:43 PM

## 2017-09-17 NOTE — Progress Notes (Signed)
PROGRESS NOTE    Joseph [name]  WJX:[id number] DOB: October 04, 1971 DOA: 09/16/2017 PCP: Marva Panda, NP    Brief Narrative: Joseph [name] is a 46 y.o. male with medical history of coronary artery disease with history of PCI August 2017, diabetes mellitus, pulmonary embolus, tobacco abuse, essential hypertension, hyperlipidemia presented with one-week history of increasing right foot pain, swelling, and erythema about his right great toe.  Assessment & Plan:   Active Problems:   Essential hypertension   OSA (obstructive sleep apnea)   CAD (coronary artery disease)   Chronic ulcer of great toe of right foot (HCC)   Diabetic foot infection (HCC)   Uncontrolled type 2 diabetes mellitus with hyperglycemia, with long-term current use of insulin (HCC)   Diabetic foot infection / right foot cellulitis:MRI sig for osteomyelitis. Afebrile, but with significant leukocytosis.  Orthopedics consulted and plan for surgical debridement.  ABI is pending.  Resume broad spectrum IV antibiotics.    Hyperkalemia :  Resolved. With IV FLUIDS.  ACE inhibitor on hold.    CAD: No chest pain.  S/p LCX PCI in 07/2016.    Diabetes Mellitus:  CBG (last 3)   Recent Labs  09/16/17 2032 09/17/17 0558 09/17/17 1301  GLUCAP 158* 84 151*    Resume SSI.  hgba1c is 9.5  Holding oral medication.  Decrease the dose of lantus to 45 units an add premeal coverage if needed.    Hyperlipidemia: Resume lipitor and fenofibrate.   H/o PE and DVT: - resume xarelto.       DVT prophylaxis: xarelto  Code Status:  Full code.  Family Communication: family at bedside.  Disposition Plan: pending OR and PT eval.    Consultants:   Orthopedics.   Procedures: MRI foot.   Antimicrobials: vancomycin , rocephin and flagyl.   Subjective: Pain is controlled and able to walk.   Objective: Vitals:   09/16/17 1740 09/16/17 1820 09/16/17 2034 09/17/17 0600  BP: 136/88 127/86 121/81 121/81    Pulse: 98 95 99 100  Resp: Temp:  98.6 F (37 C)  98 F (36.7 C)  TempSrc:  Oral  Oral  SpO2: 100% 100% 100% 99%  Weight:      Height:        Intake/Output Summary (Last 24 hours) at 09/17/17 1134 Last data filed at 09/17/17 0900  Gross per 24 hour  Intake              290 ml  Output                0 ml  Net              290 ml   Filed Weights   09/16/17 1244  Weight: 120.2 kg (265 lb)    Examination:  General exam: Appears calm and comfortable  Respiratory system: Clear to auscultation. Respiratory effort normal. Cardiovascular system: S1 & S2 heard, RRR. No JVD, murmurs, rubs, gallops or clicks. No pedal edema. Gastrointestinal system: Abdomen is nondistended, soft and nontender. No organomegaly or masses felt. Normal bowel sounds heard. Central nervous system: Alert and oriented. No focal neurological deficits. Extremities: right foot ulcer with foul smelling drainage, with surrounding cellulitis .  Skin: No rashes, lesions or ulcers Psychiatry: Judgement and insight appear normal. Mood & affect appropriate.     Data Reviewed: I have personally reviewed following labs and imaging studies  CBC:  Recent Labs Lab 09/16/17 1326 09/17/17 0618  WBC 14.0* 14.7*  NEUTROABS 10.0*  --   HGB 13.9 12.6*  HCT 42.4 38.0*  MCV 87.2 85.0  PLT 336 321   Basic Metabolic Panel:  Recent Labs Lab 09/16/17 1326 09/17/17 0618  NA 140 136  K 5.4* 3.8  CL 105 105  CO2 25 24  GLUCOSE 266* 114*  BUN 19 16  CREATININE 1.00 1.10  CALCIUM 9.5 8.7*   GFR: Estimated Creatinine Clearance: 123 mL/min (by C-G formula based on SCr of 1.1 mg/dL). Liver Function Tests:  Recent Labs Lab 09/17/17 0618  AST 14*  ALT 12*  ALKPHOS 61  BILITOT 0.5  PROT 6.4*  ALBUMIN 3.1*   No results for input(s): LIPASE, AMYLASE in the last 168 hours. No results for input(s): AMMONIA in the last 168 hours. Coagulation Profile: No results for input(s): INR, PROTIME in the  last 168 hours. Cardiac Enzymes: No results for input(s): CKTOTAL, CKMB, CKMBINDEX, TROPONINI in the last 168 hours. BNP (last 3 results) No results for input(s): PROBNP in the last 8760 hours. HbA1C:  Recent Labs  09/17/17 0618  HGBA1C 9.5*   CBG:  Recent Labs Lab 09/16/17 1247 09/16/17 2032 09/17/17 0558  GLUCAP 222* 158* 84   Lipid Profile: No results for input(s): CHOL, HDL, LDLCALC, TRIG, CHOLHDL, LDLDIRECT in the last 72 hours. Thyroid Function Tests: No results for input(s): TSH, T4TOTAL, FREET4, T3FREE, THYROIDAB in the last 72 hours. Anemia Panel: No results for input(s): VITAMINB12, FOLATE, FERRITIN, TIBC, IRON, RETICCTPCT in the last 72 hours. Sepsis Labs: No results for input(s): PROCALCITON, LATICACIDVEN in the last 168 hours.  No results found for this or any previous visit (from the past 240 hour(s)).       Radiology Studies: Mr Foot Right Wo Contrast  Result Date: 09/16/2017 CLINICAL DATA:  Right great toe wound for months. History of diabetes and neuropathy. EXAM: MRI OF THE RIGHT FOREFOOT WITHOUT CONTRAST TECHNIQUE: Multiplanar, multisequence MR imaging of the right forefoot was performed. No intravenous contrast was administered. COMPARISON:  Radiographs 09/16/2017 and 02/16/2011. FINDINGS: Despite efforts by the technologist and patient, mild motion artifact is present on today's exam and could not be eliminated. This reduces exam sensitivity and specificity. In addition, there is heterogeneous fat saturation in the toes on the T2 weighted images. Bones/Joint/Cartilage As above, there is heterogeneous fat saturation in the toes on the T2 weighted images. Sagittal inversion recovery images demonstrate T2 marrow hyperintensity in the distal phalanx of the great toe. There is mild joint space narrowing at the interphalangeal joint of the great toe with mildly decreased subchondral signal on coronal T1 weighted images. No cortical destruction is identified.  There is no significant interphalangeal joint effusion. The additional phalanges and metatarsals appear normal. The tibial sesamoid of the first metatarsal is bipartite. Ligaments The Lisfranc ligament is intact. Muscles and Tendons The forefoot muscles and tendons appear intact. No significant tenosynovitis. Probable mild forefoot myopathy. Soft tissues In correlation with recent radiographs, there is soft tissue ulceration along plantar aspect of the interphalangeal joint of the great toe. No drainable fluid collection is seen. There is mild subcutaneous edema throughout the visualized forefoot. IMPRESSION: 1. Soft tissue ulceration along the plantar aspect of the great toe at the level of the interphalangeal joint. No evidence of soft tissue abscess. 2. T2 marrow hyperintensity within the distal phalanx of the great toe is concerning for early osteomyelitis given the proximity to the adjacent soft tissue wound. No cortical destruction identified on T1 weighted images. Electronically Signed   By: Chrissie Noa  Purcell Mouton M.D.   On: 09/16/2017 15:49   Dg Toe Great Right  Result Date: 09/16/2017 CLINICAL DATA:  Diabetes mellitus with soft tissue ulceration EXAM: RIGHT FIRST TOE:  3 VIEWS COMPARISON:  Right foot October 14, 2011 FINDINGS: Frontal, oblique, and lateral views were obtained. There is soft tissue swelling of the first digit diffusely. There is lucency along the volar aspect at the level of the medial aspect of the first IP joint measuring approximately 1.6 x 1.4 cm. There is minimal calcification within this lucent area. There is no fracture or dislocation. No erosive change or bony destruction. Joint spaces appear unremarkable. IMPRESSION: Soft tissue ulceration at the level of the first IP joint. A small amount of calcification is noted in this area of ulceration, suggesting chronicity. Diffuse soft tissue swelling first digit. No bony destruction is appreciable by radiography. No fracture or dislocation.  No appreciable joint space narrowing or erosion. Electronically Signed   By: Bretta Bang III M.D.   On: 09/16/2017 13:10        Scheduled Meds: . ALPRAZolam  1-2 mg Oral QID  . aspirin  81 mg Oral Daily  . atorvastatin  40 mg Oral q1800  . buPROPion  150 mg Oral BID  . clopidogrel  75 mg Oral Q breakfast  . fenofibrate  160 mg Oral Daily  . gabapentin  300 mg Oral QHS  . insulin aspart  0-15 Units Subcutaneous TID WC  . insulin aspart  0-5 Units Subcutaneous QHS  . insulin glargine  50 Units Subcutaneous QHS  . oxyCODONE  15 mg Oral 5 X Daily  . pregabalin  50-100 mg Oral See admin instructions  . rivaroxaban  20 mg Oral Daily   Continuous Infusions: . sodium chloride 75 mL/hr at 09/16/17 1919  . cefTRIAXone (ROCEPHIN)  IV Stopped (09/16/17 2104)  . metronidazole 500 mg (09/17/17 0943)  . vancomycin Stopped (09/17/17 0501)     LOS: 1 day    Time spent: 35 minutes.     Kathlen Mody, MD [facility] Pager [phone]  If 7PM-7AM, please contact night-coverage [email or web address] Password [facility] 09/17/2017, 11:34 AM

## 2017-09-18 ENCOUNTER — Other Ambulatory Visit: Payer: Self-pay

## 2017-09-18 ENCOUNTER — Inpatient Hospital Stay (HOSPITAL_COMMUNITY): Payer: Medicare Other

## 2017-09-18 DIAGNOSIS — R0902 Hypoxemia: Secondary | ICD-10-CM

## 2017-09-18 DIAGNOSIS — L97519 Non-pressure chronic ulcer of other part of right foot with unspecified severity: Secondary | ICD-10-CM

## 2017-09-18 LAB — BLOOD GAS, ARTERIAL
Acid-base deficit: 1.1 mmol/L (ref 0.0–2.0)
Bicarbonate: 22.6 mmol/L (ref 20.0–28.0)
Drawn by: 441371
O2 Content: 2 L/min
O2 Saturation: 95.9 %
Patient temperature: 98.2
pCO2 arterial: 34.6 mmHg (ref 32.0–48.0)
pH, Arterial: 7.43 (ref 7.350–7.450)
pO2, Arterial: 75.8 mmHg — ABNORMAL LOW (ref 83.0–108.0)

## 2017-09-18 LAB — CBC
HCT: 37.7 % — ABNORMAL LOW (ref 39.0–52.0)
Hemoglobin: 12.3 g/dL — ABNORMAL LOW (ref 13.0–17.0)
MCH: 27.7 pg (ref 26.0–34.0)
MCHC: 32.6 g/dL (ref 30.0–36.0)
MCV: 84.9 fL (ref 78.0–100.0)
Platelets: 280 10*3/uL (ref 150–400)
RBC: 4.44 MIL/uL (ref 4.22–5.81)
RDW: 12.8 % (ref 11.5–15.5)
WBC: 16 10*3/uL — ABNORMAL HIGH (ref 4.0–10.5)

## 2017-09-18 LAB — GLUCOSE, CAPILLARY
Glucose-Capillary: 225 mg/dL — ABNORMAL HIGH (ref 65–99)
Glucose-Capillary: 231 mg/dL — ABNORMAL HIGH (ref 65–99)
Glucose-Capillary: 240 mg/dL — ABNORMAL HIGH (ref 65–99)
Glucose-Capillary: 368 mg/dL — ABNORMAL HIGH (ref 65–99)
Glucose-Capillary: 240 mg/dL — ABNORMAL HIGH (ref 65–99)

## 2017-09-18 LAB — BASIC METABOLIC PANEL
Anion gap: 10 (ref 5–15)
BUN: 16 mg/dL (ref 6–20)
CO2: 22 mmol/L (ref 22–32)
Calcium: 8.5 mg/dL — ABNORMAL LOW (ref 8.9–10.3)
Chloride: 102 mmol/L (ref 101–111)
Creatinine, Ser: 0.94 mg/dL (ref 0.61–1.24)
GFR calc Af Amer: >60 mL/min (ref >60)
GFR calc non Af Amer: >60 mL/min (ref >60)
Glucose, Bld: 233 mg/dL — ABNORMAL HIGH (ref 65–99)
Potassium: 4.2 mmol/L (ref 3.5–5.1)
Sodium: 134 mmol/L — ABNORMAL LOW (ref 135–145)

## 2017-09-18 LAB — TROPONIN I: Troponin I: <0.03 ng/mL (ref <0.03)

## 2017-09-18 LAB — VANCOMYCIN, TROUGH: Vancomycin Tr: 17 ug/mL (ref 15–20)

## 2017-09-18 LAB — RAPID URINE DRUG SCREEN, HOSP PERFORMED
Amphetamines: NOT DETECTED
Barbiturates: NOT DETECTED
Benzodiazepines: POSITIVE — AB
Cocaine: NOT DETECTED
Opiates: NOT DETECTED
Tetrahydrocannabinol: NOT DETECTED

## 2017-09-18 MED ORDER — INSULIN ASPART 100 UNIT/ML ~~LOC~~ SOLN
2.0000 [IU] | Freq: Three times a day (TID) | SUBCUTANEOUS | Status: DC
  Administered 2017-09-19: 2 [IU] via SUBCUTANEOUS

## 2017-09-18 MED ORDER — INFLUENZA VAC SPLIT QUAD 0.5 ML IM SUSY
0.5000 mL | PREFILLED_SYRINGE | INTRAMUSCULAR | Status: DC
  Filled 2017-09-18: qty 0.5

## 2017-09-18 MED ORDER — NALOXONE HCL 0.4 MG/ML IJ SOLN
0.2000 mg | INTRAMUSCULAR | Status: DC | PRN
  Administered 2017-09-18: 0.2 mg via INTRAVENOUS
  Filled 2017-09-18: qty 1

## 2017-09-18 MED ORDER — INSULIN GLARGINE 100 UNIT/ML ~~LOC~~ SOLN
50.0000 [IU] | Freq: Every day | SUBCUTANEOUS | Status: DC
  Administered 2017-09-18: 50 [IU] via SUBCUTANEOUS
  Filled 2017-09-18: qty 0.5

## 2017-09-18 MED ORDER — ALPRAZOLAM 0.5 MG PO TABS
1.0000 mg | ORAL_TABLET | Freq: Four times a day (QID) | ORAL | Status: DC | PRN
  Administered 2017-09-19: 2 mg via ORAL
  Administered 2017-09-19: 1 mg via ORAL
  Administered 2017-09-20 – 2017-09-23 (×3): 2 mg via ORAL
  Filled 2017-09-18 (×5): qty 4

## 2017-09-18 NOTE — Progress Notes (Signed)
RT placed patient on CPAP. Patient tolerating well at this time. RT will monitor as needed. 

## 2017-09-18 NOTE — Progress Notes (Signed)
Pharmacy Antibiotic Note  Joseph [name] is a 46 y.o. male admitted on 09/16/2017 with a diabetic/ right foot infection. Pharmacy has been consulted for Vancomycin dosing for wound infection/cellulitis. MRI positive for osteomyelitis.  Also starting IV ceftriaxone, metronidazole.   Vancomycin trough is therapeutic for goal of 15-20  for osteo.   Plan: Continue Vancomycin 1000 mg IV q8h  Monitor renal function, renal function, steady state vancomycin trough per protocol.  Height:  (195.6 cm) Weight: 265 lb (120.2 kg) IBW/kg (Calculated) : 89.1  Temp (24hrs), Avg:98.3 F (36.8 C), Min:98.1 F (36.7 C), Max:98.5 F (36.9 C)   Recent Labs Lab 09/16/17 1326 09/17/17 0618 09/18/17 0508 09/18/17 1120  WBC 14.0* 14.7* 16.0*  --   CREATININE 1.00 1.10 0.94  --   VANCOTROUGH  --   --   --  17    Estimated Creatinine Clearance: 144 mL/min (by C-G formula based on SCr of 0.94 mg/dL).    Allergies  Allergen Reactions  . Sulfa Antibiotics Other (See Comments)    headache    Antimicrobials this admission: Vancomycin 9/28>> Ceftriaxone 9/28>> Metronidazole 9/28>>  Dose adjustments this admission: 9/30 Vancomycin trough = 17 on 1g IV every 8 hours.   Microbiology results: None pending   Thank you for allowing pharmacy to be a part of this patient's care.  Link Snuffer, PharmD, BCPS Clinical Pharmacist Clinical phone 09/18/2017 until 3:30PM [phone] After hours, please call [phone] 09/18/2017 12:37 PM

## 2017-09-18 NOTE — Progress Notes (Signed)
Notified by primary RN, patient is less responsive.  VSS BP 119/80; P 92, Spo2 100% 2LNC, Resp 16.  Notified Rapid Response RN.  Notified attending MD, Dr. Blake Divine.  Awaiting orders.  Will continue to monitor.

## 2017-09-18 NOTE — Progress Notes (Signed)
Pt returned to unit from outside, wife notified this nurse that pt "doesn't seem right."  This nurse notes pt to be less responsive than baseline, pt is pale, diaphoretic and speech somewhat slurred. Pt also unable to completely follow commands.  VSS 111/78, 101, 100% CBG 240.  Attending MD aware of above, see new orders.  [id number]

## 2017-09-18 NOTE — Progress Notes (Signed)
PROGRESS NOTE    Joseph [name]  ZOX:[id number] DOB: 08/14/1971 DOA: 09/16/2017 PCP: Marva Panda, NP    Brief Narrative: Joseph [name] is a 46 y.o. male with medical history of coronary artery disease with history of PCI August 2017, diabetes mellitus, pulmonary embolus, tobacco abuse, essential hypertension, hyperlipidemia presented with one-week history of increasing right foot pain, swelling, and erythema about his right great toe.  Assessment & Plan:   Active Problems:   Essential hypertension   OSA (obstructive sleep apnea)   CAD (coronary artery disease)   Chronic ulcer of great toe of right foot (HCC)   Diabetic foot infection (HCC)   Uncontrolled type 2 diabetes mellitus with hyperglycemia, with long-term current use of insulin (HCC)   Diabetic foot infection / right foot cellulitis:MRI sig for osteomyelitis. Afebrile, but with significant leukocytosis.  Orthopedics consulted and plan for surgical debridement on Wednesday.  ABI is pending.  Resume broad spectrum IV antibiotics.  Resume IV vancomycin, flagyl and rocephin.  Pain control.    Hyperkalemia :  Resolved. With IV FLUIDS.  ACE inhibitor on hold.    CAD: No chest pain.  S/p LCX PCI in 07/2016.    Diabetes Mellitus:  CBG (last 3)   Recent Labs  09/17/17 2105 09/18/17 0647 09/18/17 1219  GLUCAP 167* 225* 231*    Resume SSI.  hgba1c is 9.5  Holding oral medication. Increase the dose of lantus to 50 units and add 2 units of novolog TIDAC.    Hyperlipidemia: Resume lipitor and fenofibrate.   H/o PE and DVT: - resume xarelto.    Acute encephalopathy:  Unclear etiology.  Suspect from drug induced from benzo's?  He became alert and oriented once he received the narcan.  Would recommend decreasing the dose of oxy from 15 to 10 mg but patient's wife is adamant about the 15 mg , 5 times daily.  Would also decrease the dose of xanax to 1 mg 4 times scheduled to prn.  abg unremarkable.    CXR does not show any acute pathology EKG unchanged.    OSA: ON CPAP at night.  Ordered CPAP while inpatient.     DVT prophylaxis: xarelto  Code Status:  Full code.  Family Communication: family at bedside.  Disposition Plan: pending OR and PT eval.    Consultants:   Orthopedics.   Procedures: MRI foot.   Antimicrobials: vancomycin , rocephin and flagyl.   Subjective: Lethargic and mumbling.   Objective: Vitals:   09/17/17 0600 09/17/17 1304 09/17/17 2055 09/18/17 0645  BP: 121/81 134/81 120/76 135/74  Pulse: 100 98 100 (!) 103  Resp: Temp: 98 F (36.7 C) 98.1 F (36.7 C) 98.5 F (36.9 C) 98.2 F (36.8 C)  TempSrc: Oral Oral Oral Oral  SpO2: 99% 100% 100% 98%  Weight:      Height:       No intake or output data in the 24 hours ending 09/18/17 1535 Filed Weights   09/16/17 1244  Weight: 120.2 kg (265 lb)    Examination:  General exam: Appears calm and comfortable on 2lit of [place] OXYGEN.  Respiratory system: Clear to auscultation. Respiratory effort normal. No wheezing or rhonchi.  Cardiovascular system: S1 & S2 heard, RRR. No JVD, murmurs,  Gastrointestinal system: Abdomen is soft nont ender non distended bowel sounds heard. . Central nervous system: lethargic, mumbling.minimally following commands. Pupils reacting to light.  Extremities: right foot ulcer with foul smelling drainage, with surrounding cellulitis .  Skin: No rashes, lesions or ulcers Psychiatry: couldn't be assessed as he is lethargic.     Data Reviewed: I have personally reviewed following labs and imaging studies  CBC:  Recent Labs Lab 09/16/17 1326 09/17/17 0618 09/18/17 0508  WBC 14.0* 14.7* 16.0*  NEUTROABS 10.0*  --   --   HGB 13.9 12.6* 12.3*  HCT 42.4 38.0* 37.7*  MCV 87.2 85.0 84.9  PLT 336 321 280   Basic Metabolic Panel:  Recent Labs Lab 09/16/17 1326 09/17/17 0618 09/18/17 0508  NA 140 136 134*  K 5.4* 3.8 4.2  CL 105 105 102  CO2 GLUCOSE 266* 114* 233*  BUN CREATININE 1.00 1.10 0.94  CALCIUM 9.5 8.7* 8.5*   GFR: Estimated Creatinine Clearance: 144 mL/min (by C-G formula based on SCr of 0.94 mg/dL). Liver Function Tests:  Recent Labs Lab 09/17/17 0618  AST 14*  ALT 12*  ALKPHOS 61  BILITOT 0.5  PROT 6.4*  ALBUMIN 3.1*   No results for input(s): LIPASE, AMYLASE in the last 168 hours. No results for input(s): AMMONIA in the last 168 hours. Coagulation Profile: No results for input(s): INR, PROTIME in the last 168 hours. Cardiac Enzymes: No results for input(s): CKTOTAL, CKMB, CKMBINDEX, TROPONINI in the last 168 hours. BNP (last 3 results) No results for input(s): PROBNP in the last 8760 hours. HbA1C:  Recent Labs  09/17/17 0618  HGBA1C 9.5*   CBG:  Recent Labs Lab 09/17/17 1301 09/17/17 1652 09/17/17 2105 09/18/17 0647 09/18/17 1219  GLUCAP 151* 181* 167* 225* 231*   Lipid Profile: No results for input(s): CHOL, HDL, LDLCALC, TRIG, CHOLHDL, LDLDIRECT in the last 72 hours. Thyroid Function Tests: No results for input(s): TSH, T4TOTAL, FREET4, T3FREE, THYROIDAB in the last 72 hours. Anemia Panel: No results for input(s): VITAMINB12, FOLATE, FERRITIN, TIBC, IRON, RETICCTPCT in the last 72 hours. Sepsis Labs: No results for input(s): PROCALCITON, LATICACIDVEN in the last 168 hours.  No results found for this or any previous visit (from the past 240 hour(s)).       Radiology Studies: Mr Foot Right Wo Contrast  Result Date: 09/16/2017 CLINICAL DATA:  Right great toe wound for months. History of diabetes and neuropathy. EXAM: MRI OF THE RIGHT FOREFOOT WITHOUT CONTRAST TECHNIQUE: Multiplanar, multisequence MR imaging of the right forefoot was performed. No intravenous contrast was administered. COMPARISON:  Radiographs 09/16/2017 and 02/16/2011. FINDINGS: Despite efforts by the technologist and patient, mild motion artifact is present on today's exam and could not be  eliminated. This reduces exam sensitivity and specificity. In addition, there is heterogeneous fat saturation in the toes on the T2 weighted images. Bones/Joint/Cartilage As above, there is heterogeneous fat saturation in the toes on the T2 weighted images. Sagittal inversion recovery images demonstrate T2 marrow hyperintensity in the distal phalanx of the great toe. There is mild joint space narrowing at the interphalangeal joint of the great toe with mildly decreased subchondral signal on coronal T1 weighted images. No cortical destruction is identified. There is no significant interphalangeal joint effusion. The additional phalanges and metatarsals appear normal. The tibial sesamoid of the first metatarsal is bipartite. Ligaments The Lisfranc ligament is intact. Muscles and Tendons The forefoot muscles and tendons appear intact. No significant tenosynovitis. Probable mild forefoot myopathy. Soft tissues In correlation with recent radiographs, there is soft tissue ulceration along plantar aspect of the interphalangeal joint of the great toe. No drainable fluid collection is seen. There is mild subcutaneous edema  throughout the visualized forefoot. IMPRESSION: 1. Soft tissue ulceration along the plantar aspect of the great toe at the level of the interphalangeal joint. No evidence of soft tissue abscess. 2. T2 marrow hyperintensity within the distal phalanx of the great toe is concerning for early osteomyelitis given the proximity to the adjacent soft tissue wound. No cortical destruction identified on T1 weighted images. Electronically Signed   By: Carey Bullocks M.D.   On: 09/16/2017 15:49   Dg Chest Port 1 View  Result Date: 09/18/2017 CLINICAL DATA:  Hypoxia.  Chronic chest pain. EXAM: PORTABLE CHEST 1 VIEW COMPARISON:  12/20/2016 and prior radiographs FINDINGS: The cardiomediastinal silhouette is unremarkable. There is no evidence of focal airspace disease, pulmonary edema, suspicious pulmonary  nodule/mass, pleural effusion, or pneumothorax. No acute bony abnormalities are identified. IMPRESSION: No active disease. Electronically Signed   By: Harmon Pier M.D.   On: 09/18/2017 14:36        Scheduled Meds: . ALPRAZolam  1-2 mg Oral QID  . aspirin  81 mg Oral Daily  . atorvastatin  40 mg Oral q1800  . buPROPion  150 mg Oral BID  . clopidogrel  75 mg Oral Q breakfast  . fenofibrate  160 mg Oral Daily  . gabapentin  300 mg Oral QHS  . insulin aspart  0-15 Units Subcutaneous TID WC  . insulin aspart  0-5 Units Subcutaneous QHS  . insulin glargine  45 Units Subcutaneous QHS  . oxyCODONE  15 mg Oral 5 X Daily  . pregabalin  100 mg Oral Daily  . pregabalin  50 mg Oral Q1400  . rivaroxaban  20 mg Oral Daily   Continuous Infusions: . sodium chloride 75 mL/hr at 09/16/17 1919  . cefTRIAXone (ROCEPHIN)  IV Stopped (09/17/17 1904)  . metronidazole 500 mg (09/18/17 0949)  . vancomycin 1,000 mg (09/18/17 0355)     LOS: 2 days    Time spent: 35 minutes.     Kathlen Mody, MD [facility] Pager [phone]  If 7PM-7AM, please contact night-coverage [email or web address] Password [email or web address] 09/18/2017, 3:35 PM

## 2017-09-19 ENCOUNTER — Inpatient Hospital Stay (HOSPITAL_COMMUNITY): Payer: Medicare Other

## 2017-09-19 ENCOUNTER — Encounter (HOSPITAL_COMMUNITY): Payer: Self-pay | Admitting: General Practice

## 2017-09-19 ENCOUNTER — Ambulatory Visit (INDEPENDENT_AMBULATORY_CARE_PROVIDER_SITE_OTHER): Payer: Medicare Other | Admitting: Orthopaedic Surgery

## 2017-09-19 ENCOUNTER — Other Ambulatory Visit: Payer: Self-pay | Admitting: Cardiovascular Disease

## 2017-09-19 DIAGNOSIS — L97519 Non-pressure chronic ulcer of other part of right foot with unspecified severity: Secondary | ICD-10-CM

## 2017-09-19 DIAGNOSIS — R110 Nausea: Secondary | ICD-10-CM

## 2017-09-19 DIAGNOSIS — R634 Abnormal weight loss: Secondary | ICD-10-CM

## 2017-09-19 DIAGNOSIS — R1010 Upper abdominal pain, unspecified: Secondary | ICD-10-CM

## 2017-09-19 DIAGNOSIS — Z7901 Long term (current) use of anticoagulants: Secondary | ICD-10-CM

## 2017-09-19 HISTORY — DX: Non-pressure chronic ulcer of other part of right foot with unspecified severity: L97.519

## 2017-09-19 LAB — GLUCOSE, CAPILLARY
Glucose-Capillary: 240 mg/dL — ABNORMAL HIGH (ref 65–99)
Glucose-Capillary: 251 mg/dL — ABNORMAL HIGH (ref 65–99)
Glucose-Capillary: 175 mg/dL — ABNORMAL HIGH (ref 65–99)
Glucose-Capillary: 241 mg/dL — ABNORMAL HIGH (ref 65–99)

## 2017-09-19 MED ORDER — INSULIN GLARGINE 100 UNIT/ML ~~LOC~~ SOLN
55.0000 [IU] | Freq: Every day | SUBCUTANEOUS | Status: DC
  Administered 2017-09-19 – 2017-09-22 (×4): 55 [IU] via SUBCUTANEOUS
  Filled 2017-09-19 (×5): qty 0.55

## 2017-09-19 MED ORDER — INSULIN ASPART 100 UNIT/ML ~~LOC~~ SOLN
5.0000 [IU] | Freq: Three times a day (TID) | SUBCUTANEOUS | Status: DC
  Administered 2017-09-19 – 2017-09-22 (×5): 5 [IU] via SUBCUTANEOUS

## 2017-09-19 MED ORDER — ENSURE ENLIVE PO LIQD
237.0000 mL | Freq: Two times a day (BID) | ORAL | Status: DC
  Administered 2017-09-19 – 2017-09-20 (×2): 237 mL via ORAL

## 2017-09-19 MED ORDER — MECLIZINE HCL 12.5 MG PO TABS
12.5000 mg | ORAL_TABLET | Freq: Two times a day (BID) | ORAL | Status: DC | PRN
  Administered 2017-09-19: 12.5 mg via ORAL
  Filled 2017-09-19 (×2): qty 1

## 2017-09-19 MED ORDER — PANTOPRAZOLE SODIUM 40 MG PO TBEC
40.0000 mg | DELAYED_RELEASE_TABLET | Freq: Every day | ORAL | Status: DC
  Administered 2017-09-19 – 2017-09-23 (×5): 40 mg via ORAL
  Filled 2017-09-19 (×5): qty 1

## 2017-09-19 NOTE — Progress Notes (Signed)
PROGRESS NOTE    Joseph [name]  ZOX:[id number] DOB: 1971-08-09 DOA: 09/16/2017 PCP: Marva Panda, NP    Brief Narrative: Joseph [name] is a 46 y.o. male with medical history of coronary artery disease with history of PCI August 2017, diabetes mellitus, pulmonary embolus, tobacco abuse, essential hypertension, hyperlipidemia presented with one-week history of increasing right foot pain, swelling, and erythema about his right great toe.  Assessment & Plan:   Active Problems:   Essential hypertension   OSA (obstructive sleep apnea)   CAD (coronary artery disease)   Chronic ulcer of great toe of right foot (HCC)   Diabetic foot infection (HCC)   Uncontrolled type 2 diabetes mellitus with hyperglycemia, with long-term current use of insulin (HCC)   Diabetic foot infection / right foot cellulitis:MRI sig for osteomyelitis. Afebrile, but with significant leukocytosis.  Orthopedics consulted and plan for surgical debridement on Wednesday.  ABI is pending.  Resume broad spectrum IV antibiotics.  Resume IV vancomycin, flagyl and rocephin. Day 4 of antibiotics.  Pain control.  CRP is 8.9.   Hyperkalemia :  Resolved. With IV FLUIDS.  ACE inhibitor on hold.    CAD: No chest pain.  S/p LCX PCI in 07/2016.    Diabetes Mellitus:  CBG (last 3)   Recent Labs  09/18/17 2135 09/19/17 0638 09/19/17 1224  GLUCAP 240* 240* 251*    Resume SSI.  hgba1c is 9.5  Increasing Lantus to 55 units and add 5 units TIDAC.    Hyperlipidemia: Resume lipitor and fenofibrate.   H/o PE and DVT: - resume xarelto.   Acute encephalopathy:  Unclear etiology.  Suspect from drug induced from benzo's?  He became alert and oriented once he received the narcan.  Would recommend decreasing the dose of oxy from 15 to 10 mg but patient's wife is adamant about the 15 mg , 5 times daily. Will change to PRN.  Would also decrease the dose of xanax to 1 mg 4 times scheduled to prn.  abg  unremarkable.  CXR does not show any acute pathology EKG unchanged. CT head without contrast , no acute pathology.  UDS is positive for benzodiazepines.    OSA: ON CPAP at night.  Ordered CPAP while inpatient.    Anemia:  - normocytic , hemoglobin stable around 12.   DVT prophylaxis: xarelto Code Status:  Full code.  Family Communication: family at bedside. Discussed with family at bedside.  Disposition Plan: pending OR and PT eval.    Consultants:   Orthopedics.   Procedures: MRI foot.   Antimicrobials: vancomycin , rocephin and flagyl since admission.   Subjective: Alert and requesting for meclizine.   Objective: Vitals:   09/18/17 2332 09/19/17 0400 09/19/17 0500 09/19/17 1500  BP:   (!) 125/93 123/90  Pulse: 89  87 86  Resp: Temp:   98 F (36.7 C) 98.6 F (37 C)  TempSrc:   Oral Oral  SpO2: 95%  96% 96%  Weight:      Height:        Intake/Output Summary (Last 24 hours) at 09/19/17 1641 Last data filed at 09/19/17 1500  Gross per 24 hour  Intake              740 ml  Output                0 ml  Net              740 ml  Filed Weights   09/16/17 1244  Weight: 120.2 kg (265 lb)    Examination:  General exam: Appears calm and comfortable.  Respiratory system: good air entry bilateral. No wheezing or rhonchi.  Cardiovascular system: S1 S2 heard, RRR, no JVD, no murmer. Left leg edema >right leg edema.   Gastrointestinal system: Abdomen is soft NT ND BS+. Central nervous system: ALERT AND oriented, non focal .  Extremities: right foot ulcer with foul smelling drainage, with worsening surrounding cellulitis .  Skin: No rashes, lesions or ulcers Psychiatry: mood and affect good.     Data Reviewed: I have personally reviewed following labs and imaging studies  CBC:  Recent Labs Lab 09/16/17 1326 09/17/17 0618 09/18/17 0508  WBC 14.0* 14.7* 16.0*  NEUTROABS 10.0*  --   --   HGB 13.9 12.6* 12.3*  HCT 42.4 38.0* 37.7*  MCV 87.2  85.0 84.9  PLT 336 321 280   Basic Metabolic Panel:  Recent Labs Lab 09/16/17 1326 09/17/17 0618 09/18/17 0508  NA 140 136 134*  K 5.4* 3.8 4.2  CL 105 105 102  CO2 GLUCOSE 266* 114* 233*  BUN CREATININE 1.00 1.10 0.94  CALCIUM 9.5 8.7* 8.5*   GFR: Estimated Creatinine Clearance: 144 mL/min (by C-G formula based on SCr of 0.94 mg/dL). Liver Function Tests:  Recent Labs Lab 09/17/17 0618  AST 14*  ALT 12*  ALKPHOS 61  BILITOT 0.5  PROT 6.4*  ALBUMIN 3.1*   No results for input(s): LIPASE, AMYLASE in the last 168 hours. No results for input(s): AMMONIA in the last 168 hours. Coagulation Profile: No results for input(s): INR, PROTIME in the last 168 hours. Cardiac Enzymes:  Recent Labs Lab 09/18/17 1453  TROPONINI <0.03   BNP (last 3 results) No results for input(s): PROBNP in the last 8760 hours. HbA1C:  Recent Labs  09/17/17 0618  HGBA1C 9.5*   CBG:  Recent Labs Lab 09/18/17 1318 09/18/17 1804 09/18/17 2135 09/19/17 0638 09/19/17 1224  GLUCAP 240* 368* 240* 240* 251*   Lipid Profile: No results for input(s): CHOL, HDL, LDLCALC, TRIG, CHOLHDL, LDLDIRECT in the last 72 hours. Thyroid Function Tests: No results for input(s): TSH, T4TOTAL, FREET4, T3FREE, THYROIDAB in the last 72 hours. Anemia Panel: No results for input(s): VITAMINB12, FOLATE, FERRITIN, TIBC, IRON, RETICCTPCT in the last 72 hours. Sepsis Labs: No results for input(s): PROCALCITON, LATICACIDVEN in the last 168 hours.  No results found for this or any previous visit (from the past 240 hour(s)).       Radiology Studies: Ct Head Wo Contrast  Result Date: 09/18/2017 CLINICAL DATA:  Initial evaluation for acute altered mental status, unclear cause. EXAM: CT HEAD WITHOUT CONTRAST TECHNIQUE: Contiguous axial images were obtained from the base of the skull through the vertex without intravenous contrast. COMPARISON:  [facility] CT from 04/22/2017. FINDINGS: Brain:  Cerebral volume within normal limits for patient age. No evidence for acute intracranial hemorrhage. No findings to suggest acute large vessel territory infarct. No mass lesion, midline shift, or mass effect. Ventricles are normal in size without evidence for hydrocephalus. No extra-axial fluid collection identified. Vascular: No hyperdense vessel identified.Mild atheromatous plaque within the carotid siphons bilaterally. Skull: Scalp soft tissues demonstrate no acute abnormality.Calvarium intact. Sinuses/Orbits: Globes and orbital soft tissues are within normal limits. Mild layering opacity within the sphenoid sinuses bilaterally. Visualized paranasal sinuses otherwise clear. Trace opacity noted within the bilateral mastoid air cells, of doubtful significance. IMPRESSION: 1. No acute  intracranial process. 2. Mild sphenoid sinus disease. Electronically Signed   By: Rise Mu M.D.   On: 09/18/2017 19:06   US Abdomen Limited  Result Date: 09/19/2017 CLINICAL DATA:  Right upper quadrant pain. EXAM: ULTRASOUND ABDOMEN LIMITED RIGHT UPPER QUADRANT COMPARISON:  None. FINDINGS: Gallbladder: 8 mm stone at gallbladder fundus. No gallbladder wall thickening. There is not a sonographic Murphy sign. Common bile duct: Diameter: 5 mm Liver: No focal lesion identified. Within normal limits in parenchymal echogenicity. Portal vein is patent on color Doppler imaging with normal direction of blood flow towards the liver. IMPRESSION: Small gallstone.  No evidence for gallbladder inflammation. No biliary dilatation. Electronically Signed   By: Richarda Overlie M.D.   On: 09/19/2017 13:53   Dg Chest Port 1 View  Result Date: 09/18/2017 CLINICAL DATA:  Hypoxia.  Chronic chest pain. EXAM: PORTABLE CHEST 1 VIEW COMPARISON:  12/20/2016 and prior radiographs FINDINGS: The cardiomediastinal silhouette is unremarkable. There is no evidence of focal airspace disease, pulmonary edema, suspicious pulmonary nodule/mass, pleural  effusion, or pneumothorax. No acute bony abnormalities are identified. IMPRESSION: No active disease. Electronically Signed   By: Harmon Pier M.D.   On: 09/18/2017 14:36        Scheduled Meds: . aspirin  81 mg Oral Daily  . atorvastatin  40 mg Oral q1800  . buPROPion  150 mg Oral BID  . feeding supplement (ENSURE ENLIVE)  237 mL Oral BID BM  . fenofibrate  160 mg Oral Daily  . gabapentin  300 mg Oral QHS  . Influenza vac split quadrivalent PF  0.5 mL Intramuscular Tomorrow-1000  . insulin aspart  0-15 Units Subcutaneous TID WC  . insulin aspart  0-5 Units Subcutaneous QHS  . insulin aspart  5 Units Subcutaneous TID WC  . insulin glargine  55 Units Subcutaneous QHS  . oxyCODONE  15 mg Oral 5 X Daily  . pantoprazole  40 mg Oral Daily  . pregabalin  100 mg Oral Daily  . pregabalin  50 mg Oral Q1400   Continuous Infusions: . sodium chloride 75 mL/hr at 09/16/17 1919  . cefTRIAXone (ROCEPHIN)  IV 2 g (09/18/17 1610)  . metronidazole Stopped (09/19/17 1148)  . vancomycin 1,000 mg (09/19/17 1532)     LOS: 3 days    Time spent: 35 minutes.     Kathlen Mody, MD [facility] Pager [phone]  If 7PM-7AM, please contact night-coverage [email or web address] Password [email or web address] 09/19/2017, 4:41 PM

## 2017-09-19 NOTE — Clinical Social Work Note (Signed)
Clinical Social Work Assessment  Patient Details  Name: Joseph [name] MRN: [id number] Date of Birth: 1971/01/14  Date of referral:  09/19/17               Reason for consult:  Facility Placement                Permission sought to share information with:  Case Manager Permission granted to share information::  No  Name::     [name]   Agency::     Relationship::     Contact Information:     Housing/Transportation Living arrangements for the past 2 months:  Single Family Home Source of Information:  Spouse Patient Interpreter Needed:  None Criminal Activity/Legal Involvement Pertinent to Current Situation/Hospitalization:  No - Comment as needed Significant Relationships:  Spouse Lives with:  Spouse Do you feel safe going back to the place where you live?  Yes Need for family participation in patient care:  Yes (Comment)  Care giving concerns:  CSW went to meet with patient at bedside however he was transported for MRI.  Per spouse, patient was independent prior to hospitalization. Pt has had knees replaced in the past and would be able to go home per spouse. CSW validated and listened to spouse. Spouse has declined SNF for patient at this time. CSW will continue to follow as patient has not had surgery or been seen by PT yet and CSW reiterated that to spouse.  Spouse indicated that patient would go home either way.    Sx scheduled for 09/20/17.  Social Worker assessment / plan:  No needs at this time as spouse has declined SNF. CSW has started FL2. Surgery pending  for tomorrow.  CSW will f/u again after review of PT evaluation.  Employment status:  Disabled (Comment on whether or not currently receiving Disability) Insurance information:  Medicare PT Recommendations:  Skilled Nursing Facility Information / Referral to community resources:  Skilled Nursing Facility  Patient/Family's Response to care:  Spouse appreciative of CSW assistance at this time but has not  needs or issues.  Patient/Family's Understanding of and Emotional Response to Diagnosis, Current Treatment, and Prognosis:  Deferred at this time as patient not in room and surgery is pending. CSW will f/u for same.  Emotional Assessment Appearance:  Other (Comment Required (Unable to Assess) Attitude/Demeanor/Rapport:  Unable to Assess Affect (typically observed):  Unable to Assess Orientation:  Oriented to Self, Oriented to Place, Oriented to  Time, Oriented to Situation Alcohol / Substance use:  Not Applicable Psych involvement (Current and /or in the community):  No (Comment)  Discharge Needs  Concerns to be addressed:  Care Coordination Readmission within the last 30 days:  No Current discharge risk:  Physical Impairment, Dependent with Mobility Barriers to Discharge:  No Barriers Identified   Tresa Moore, LCSW 09/19/2017, 3:48 PM

## 2017-09-19 NOTE — Consult Note (Addendum)
[facility] Consult: 12:44 PM 09/19/2017  LOS: 3 days    Referring Provider: Dr Blake Divine  Primary Care Physician:  Marva Panda, NP Primary Gastroenterologist:  Had appt with Dr. Ileene Patrick on Oct 4.      Reason for Consultation:  Chronic abdominal pain   HPI: Joseph [name] is a 46 y.o. male.  Hx CAD, PCI with left Cfx stent placed 07/2016.  Hx 2014 PE and DVT.  On Plavix and Xarelto. Hyperlipidemia/dyslipidemia. COPD, still smokes.  OSA on CPAP at night. Chronic pain, on large doses of oxycodone and NSAIDs.  Anxiety, on xanax.   Peripheral neuropathy.  Type 2 IDDM "non-compliant".  Chronic ulcer of right hallux.    Presented to ED 9/28 with at least 10 days hx of progressive swelling, redness in right foot.  Ortho had seen him as outpt and ordered MRI, not yet done.  Admitted with cellulitis, gangrene of toe and started on abx.  Lisinopril held due to elevated potassium.  Glucoses reveal poor diabetic control. MRI shows early osteomyelitis.  Plan is for toe amputation on 10/3, so Plavix on hold. Xarelto on hold as of today.  He has had some lethargy, decreased arousal, and AMS, so doses of narcotics have been held.     As to the patient's history of abdominal pain, it is primarily in the right upper quadrant and epigastrium although occasionally he will have pain in his left side. Pain occurs regardless of PO status but seems to be more triggered by food.  If his intermittent and lasted day or so or sometimes lasts for weeks at a time.   At points in the last several months, the pain is such that he doesn't eat hardly at all. He reports 130# weight loss and in recent months he lost as much as 18# over the course of 2 weeks.  Howevwer, per Epic weighed 333# in 08/2016, 265# currently (totals 66# loss).  He  has regular lab work and has never been told that he had problems with his LFTs. In addition to taking oxycodone 5 times daily, he takes Voltaren twice daily and ibuprofen, 1600 mg doses at a time sometimes 2-3 times a day but he can go several days without taking any of this.   4 years he had been taking omeprazole but after the stent was placed last year, he was switched to Protonix.  He thinks the Omeprozole worked better.   Family history significant for colon polyps in his father, 3 paternal uncles and his brother. Patient himself has never undergone colonoscopy or upper endoscopy. Patient's mother suffers from "gastroenteritis", "colitis ", diverticulosis and is s/p cholecystectomy. Patient's father suffers from GERD and has diverticulosis.   Patient smokes 1/2 to 1 ppd cigarettes.   Has not consumed ETOH beverages since 2004.    Patient's LFTs are not elevated.  He is not anemic. Fortunately his renal function is normal, despite all the NSAIDs.  Because he has multiple tattoos performed by a professional as far back as the 25 eighties, he has been checked  for hepatitis B and C in the past and says that these tests were negative.    Past Medical History:  Diagnosis Date  . Anxiety   . Arthritis    "knees, shoulders, hips, ankles" (07/29/2016)  . Asthma   . CAD (coronary artery disease)   . Chronic bronchitis (HCC)   . Chronic ulcer of right great toe (HCC) 09/19/2017  . COPD (chronic obstructive pulmonary disease) (HCC)   . Depression   . DVT (deep venous thrombosis) (HCC)   . GERD (gastroesophageal reflux disease)   . Headache    "about 3 times/month" (07/29/2016)  . Hyperlipidemia   . Hypertension   . Myocardial infarction (HCC) 1996   "light one"  . PE (pulmonary embolism) 04/2013  . Peripheral nerve disease   . Pneumonia "several times"  . Type II diabetes mellitus (HCC)     Past Surgical History:  Procedure Laterality Date  . CARDIAC CATHETERIZATION  2006  . CARDIAC  CATHETERIZATION  1996   "@ [facility]; when I had my heart attack"  . CARDIAC CATHETERIZATION N/A 07/29/2016   Procedure: Left Heart Cath and Coronary Angiography;  Surgeon: Runell Gess, MD;  Location: [facility] INVASIVE CV LAB;  Service: Cardiovascular;  Laterality: N/A;  . CARDIAC CATHETERIZATION N/A 07/29/2016   Procedure: Coronary Stent Intervention;  Surgeon: Runell Gess, MD;  Location: [facility] INVASIVE CV LAB;  Service: Cardiovascular;  Laterality: N/A;  . CARPAL TUNNEL RELEASE Bilateral   . CORONARY ANGIOPLASTY WITH STENT PLACEMENT  07/29/2016  . KNEE ARTHROSCOPY Bilateral    "2 on left; 1 on the right"  . SHOULDER OPEN ROTATOR CUFF REPAIR Bilateral     Prior to Admission medications   Medication Sig Start Date End Date Taking? Authorizing Provider  albuterol (PROAIR HFA) 108 (90 BASE) MCG/ACT inhaler Inhale 2 puffs into the lungs every 6 (six) hours as needed for wheezing or shortness of breath.    Yes [provider]  albuterol (PROVENTIL) (2.5 MG/3ML) 0.083% nebulizer solution Take 2.5 mg by nebulization every 4 (four) hours as needed for wheezing or shortness of breath.   Yes [provider]  ALPRAZolam Prudy Feeler) 1 MG tablet Take 1-2 mg by mouth 4 (four) times daily. 2 mg every evening   Yes [provider]  amLODipine (NORVASC) 2.5 MG tablet Take 2.5 mg by mouth every morning.    Yes [provider]  aspirin 81 MG chewable tablet Chew 1 tablet (81 mg total) by mouth daily. 07/30/16  Yes Bhagat, Bhavinkumar, PA  atorvastatin (LIPITOR) 40 MG tablet Take 1 tablet (40 mg total) by mouth daily. Patient taking differently: Take 40 mg by mouth daily at 6 PM.  07/29/15  Yes Chilton Si, MD  buPROPion [facility] SR) 150 MG 12 hr tablet Take 1 tablet by mouth 2 (two) times daily. 08/17/16  Yes [provider]  clopidogrel (PLAVIX) 75 MG tablet Take 1 tablet (75 mg total) by mouth daily with breakfast. 09/16/16  Yes Chilton Si, MD  diclofenac  (VOLTAREN) 75 MG EC tablet diclofenac sodium 75 mg tablet,delayed release  TAKE 1 TABLET BY MOUTH EVERY DAY OR TWICE DAILY   Yes [provider]  fenofibrate (TRICOR) 145 MG tablet Take 145 mg by mouth every morning.    Yes [provider]  fluconazole (DIFLUCAN) 100 MG tablet Take 100 mg by mouth every Monday.  03/12/15  Yes [provider]  gabapentin (NEURONTIN) 300 MG capsule gabapentin 300 mg capsule  TK 1 C PO BID  FOR 30 DAYS   Yes [provider]  HUMALOG KWIKPEN 100 UNIT/ML SOPN Inject 35-40 Units into the skin 3 (three) times daily with meals. As directed per sliding scale 05/02/13  Yes [provider]  ibuprofen (ADVIL,MOTRIN) 800 MG tablet ibuprofen 800 mg tablet  Take 1 tablet every 8 hours by oral route.   Yes [provider]  LANTUS SOLOSTAR 100 UNIT/ML Solostar Pen Inject 70 Units into the skin 2 (two) times daily.  12/01/14  Yes [provider]  lisinopril (PRINIVIL,ZESTRIL) 5 MG tablet Take 5 mg by mouth daily. 12/29/14  Yes [provider]  lisinopril-hydrochlorothiazide (PRINZIDE,ZESTORETIC) 20-12.5 MG tablet lisinopril 20 mg-hydrochlorothiazide 12.5 mg tablet  TAKE 1 TABLET BY MOUTH DAILY   Yes [provider]  loratadine (CLARITIN) 10 MG tablet loratadine 10 mg tablet  TK 1 T PO QD   Yes [provider]  LYRICA 50 MG capsule Take 50-100 mg by mouth See admin instructions. 100 mg every morning and 50 mg every evening 02/16/15  Yes [provider]  meclizine (ANTIVERT) 25 MG tablet Take 25 mg by mouth 3 (three) times daily as needed for dizziness. 25 mg every morning and 25 mg every evening.  May take an additional 25 mg during the day 04/10/13  Yes [provider]  meloxicam (MOBIC) 15 MG tablet meloxicam 15 mg tablet   Yes [provider]  metFORMIN (GLUCOPHAGE) 1000 MG tablet Take 1,000 mg by mouth 2 (two) times daily with a meal.     Yes [provider]    naproxen (NAPROSYN) 500 MG tablet naproxen 500 mg tablet  Take 1 tablet twice a day by oral route for 10 days.   Yes [provider]  neomycin-polymyxin-hydrocortisone (CORTISPORIN) 3.5-10000-1 OTIC suspension neomycin-polymyxin-hydrocort 3.5 mg-10,000 unit/mL-1 % ear drops,susp   Yes [provider]  nitroGLYCERIN (NITROSTAT) 0.4 MG SL tablet Place 1 tablet (0.4 mg total) under the tongue every 5 (five) minutes as needed for chest pain. 09/16/16  Yes Chilton Si, MD  oxyCODONE (ROXICODONE) 15 MG immediate release tablet Take 15 mg by mouth 5 (five) times daily.    Yes [provider]  promethazine (PHENERGAN) 25 MG tablet Take 25 mg by mouth every 6 (six) hours as needed for nausea.    Yes [provider]  Rivaroxaban (XARELTO) 20 MG TABS Take 1 tablet (20 mg total) by mouth daily. Patient taking differently: Take 20 mg by mouth daily. Take with food 05/28/13  Yes Black, Lesle Chris, NP  VICTOZA 18 MG/3ML SOPN Inject 0.2 mLs (1.2 mg total) into the skin daily. Inject once daily at the same time 04/29/15  Yes Reather Littler, MD  fluticasone [facility]) 50 MCG/ACT nasal spray Place 2 sprays into both nostrils daily as needed for allergies or rhinitis.    [provider]  pantoprazole (PROTONIX) 40 MG tablet TAKE 2 TABLETS(80 MG) BY MOUTH DAILY Patient not taking: Reported on 09/16/2017 12/27/16   Chilton Si, MD  tiZANidine (ZANAFLEX) 4 MG tablet Take 4 mg by mouth daily as needed for muscle spasms.  02/17/15   [provider]    Scheduled Meds: . aspirin  81 mg Oral Daily  . atorvastatin  40 mg Oral q1800  . buPROPion  150 mg Oral BID  . feeding supplement (ENSURE ENLIVE)  237 mL Oral BID BM  . fenofibrate  160 mg Oral Daily  . gabapentin  300 mg Oral QHS  . Influenza vac split quadrivalent PF  0.5  mL Intramuscular Tomorrow-1000  . insulin aspart  0-15 Units Subcutaneous TID WC  . insulin aspart  0-5 Units Subcutaneous QHS  . insulin aspart   5 Units Subcutaneous TID WC  . insulin glargine  55 Units Subcutaneous QHS  . oxyCODONE  15 mg Oral 5 X Daily  . pregabalin  100 mg Oral Daily  . pregabalin  50 mg Oral Q1400  . rivaroxaban  20 mg Oral Daily   Infusions: . sodium chloride 75 mL/hr at 09/16/17 1919  . cefTRIAXone (ROCEPHIN)  IV 2 g (09/18/17 1610)  . metronidazole Stopped (09/19/17 1148)  . vancomycin Stopped (09/19/17 0556)   PRN Meds: acetaminophen **OR** acetaminophen, albuterol, ALPRAZolam, naLOXone (NARCAN)  injection, ondansetron **OR** ondansetron (ZOFRAN) IV   Allergies as of 09/16/2017 - Review Complete 09/16/2017  Allergen Reaction Noted  . Sulfa antibiotics Other (See Comments) 10/06/2012    Family History  Problem Relation Age of Onset  . Hypertension Brother   . Hypertension Father   . Diabetes Other   . Hyperlipidemia Other     Social History   Social History  . Marital status: Married    Spouse name: N/A  . Number of children: N/A  . Years of education: N/A   Occupational History  . disabled  Unemployed   Social History Main Topics  . Smoking status: Current Every Day Smoker    Packs/day: 1.00    Years: 34.00    Types: Cigarettes  . Smokeless tobacco: Former Neurosurgeon    Types: Snuff, Chew     Comment: 8/910/2017 "3 ppd before 2015"  . Alcohol use Yes     Comment: 07/29/2016 "quit drinking in 2004"  . Drug use: No  . Sexual activity: Yes   Other Topics Concern  . Not on file   Social History Narrative  . No narrative on file    REVIEW OF SYSTEMS: Constitutional:  Weakness, fatigues easily. In recent days he's been more somnolent than usual, thus his narcotics were held. ENT:  No nose bleeds Pulm:  Dyspnea on exertion is stable. He has a cough which is occasionally productive. CV:  No palpitations, no LE edema. No chest pain GU:  No hematuria, no frequency GI:  Per HPI Heme:  No excessive bleeding or bruising.   Transfusions:  Does not recall previous transfusions. Neuro:   Stinging foot pain. Derm:  Nonhealing right foot ulcer for several months. Endocrine:  No sweats or chills.  No polyuria or dysuria Immunization:  Did not inquire as to recent immunizations. Travel:  None beyond local counties in last few months.    PHYSICAL EXAM: Vital signs in last 24 hours: Vitals:   09/19/17 0400 09/19/17 0500  BP:  (!) 125/93  Pulse:  87  Resp: 18 18  Temp:  98 F (36.7 C)  SpO2:  96%   Wt Readings from Last 3 Encounters:  09/16/17 120.2 kg (265 lb)  04/21/17 122 kg (269 lb)  12/20/16 (!) 140.6 kg (310 lb)    General: Obese, chronically unwell-appearing WM Head:  No facial asymmetry or swelling. No signs of head trauma.  Eyes:  No conjunctival pallor. No scleral icterus. Ears:  Not hard of hearing  Nose:  No discharge or congestion. Mouth:  Tongue midline. Oral mucosa moist, pink, clear. Neck:  No masses, no JVD, no bruits. Lungs:  Loose cough. Slightly reduced breath sounds in the bases bil.   Heart: RRR. No MRG. S1, S2 present. Abdomen:  Obese. Soft. Not tender. No  masses. No HSM. No bruits. No hernias..   Rectal: Deferred   Musc/Skeltl: No joint swelling or gross deformity.  Swelling, tenderness, erythema in the right foot.  Right hallux with sloughing skin. Extremities:  Tenderness and swelling  Neurologic:  Alert. Oriented times 3. Moves all 4 limbs. No tremor, no limb weakness. Skin:  Cellulitis at the top of the right foot. No telangiectasias. Tattoos:  Multiple tattoos on the arms.   Nodes:  No cervical adenopathy.   Psych:  Cooperative, pleasant. Slightly anxious.  Intake/Output from previous day: 09/30 0701 - 10/01 0700 In: 900 [P.O.:500; IV Piggyback:400] Out: -  Intake/Output this shift: No intake/output data recorded.  LAB RESULTS:  Recent Labs  09/16/17 1326 09/17/17 0618 09/18/17 0508  WBC 14.0* 14.7* 16.0*  HGB 13.9 12.6* 12.3*  HCT 42.4 38.0* 37.7*  PLT 336 321 280   BMET Lab Results  Component Value Date   NA 134  (L) 09/18/2017   NA 136 09/17/2017   NA 140 09/16/2017   K 4.2 09/18/2017   K 3.8 09/17/2017   K 5.4 (H) 09/16/2017   CL 102 09/18/2017   CL 105 09/17/2017   CL 105 09/16/2017   CO2 22 09/18/2017   CO2 24 09/17/2017   CO2 25 09/16/2017   GLUCOSE 233 (H) 09/18/2017   GLUCOSE 114 (H) 09/17/2017   GLUCOSE 266 (H) 09/16/2017   BUN 16 09/18/2017   BUN 16 09/17/2017   BUN 19 09/16/2017   CREATININE 0.94 09/18/2017   CREATININE 1.10 09/17/2017   CREATININE 1.00 09/16/2017   CALCIUM 8.5 (L) 09/18/2017   CALCIUM 8.7 (L) 09/17/2017   CALCIUM 9.5 09/16/2017   LFT  Recent Labs  09/17/17 0618  PROT 6.4*  ALBUMIN 3.1*  AST 14*  ALT 12*  ALKPHOS 61  BILITOT 0.5   PT/INR Lab Results  Component Value Date   INR 5.85 (HH) 07/29/2016   INR 1.07 04/01/2014   INR 1.76 (H) 08/15/2013    Drugs of Abuse     Component Value Date/Time   LABOPIA NONE DETECTED 09/18/2017 1418   COCAINSCRNUR NONE DETECTED 09/18/2017 1418   LABBENZ POSITIVE (A) 09/18/2017 1418   AMPHETMU NONE DETECTED 09/18/2017 1418   THCU NONE DETECTED 09/18/2017 1418   LABBARB NONE DETECTED 09/18/2017 1418     RADIOLOGY STUDIES: Ct Head Wo Contrast  Result Date: 09/18/2017 CLINICAL DATA:  Initial evaluation for acute altered mental status, unclear cause. EXAM: CT HEAD WITHOUT CONTRAST TECHNIQUE: Contiguous axial images were obtained from the base of the skull through the vertex without intravenous contrast. COMPARISON:  [facility] CT from 04/22/2017. FINDINGS: Brain: Cerebral volume within normal limits for patient age. No evidence for acute intracranial hemorrhage. No findings to suggest acute large vessel territory infarct. No mass lesion, midline shift, or mass effect. Ventricles are normal in size without evidence for hydrocephalus. No extra-axial fluid collection identified. Vascular: No hyperdense vessel identified.Mild atheromatous plaque within the carotid siphons bilaterally. Skull: Scalp soft tissues demonstrate  no acute abnormality.Calvarium intact. Sinuses/Orbits: Globes and orbital soft tissues are within normal limits. Mild layering opacity within the sphenoid sinuses bilaterally. Visualized paranasal sinuses otherwise clear. Trace opacity noted within the bilateral mastoid air cells, of doubtful significance. IMPRESSION: 1. No acute intracranial process. 2. Mild sphenoid sinus disease. Electronically Signed   By: Rise Mu M.D.   On: 09/18/2017 19:06   Dg Chest Port 1 View  Result Date: 09/18/2017 CLINICAL DATA:  Hypoxia.  Chronic chest pain. EXAM: PORTABLE CHEST 1 VIEW  COMPARISON:  12/20/2016 and prior radiographs FINDINGS: The cardiomediastinal silhouette is unremarkable. There is no evidence of focal airspace disease, pulmonary edema, suspicious pulmonary nodule/mass, pleural effusion, or pneumothorax. No acute bony abnormalities are identified. IMPRESSION: No active disease. Electronically Signed   By: Harmon Pier M.D.   On: 09/18/2017 14:36     IMPRESSION:   *  Chronic, intermittent abd pain in pt who takes excessive amounts of ibuprofen in addition to Voltaren.  Also chronic intermittent nausea.  R/o ulcers, r/o gastroparesis in poorly controlled diabetic on chronic oxycodone.  R/o GB disease.   Associated self reported 130# weight loss in 11 months but documentation per Epic concludes only 66# weight loss in close to 13 months, r/o neoplasia.    *  Right Hallux gangrene, osteo and associated pedal cellulitis.  On Abx: VAnc, Flagyl, Ceftriaxone.  Amputation planned 10/3.    *  Hx DVT and PE 2014, Xarelto on hold as of today  *  S/P cardiac DES 07/2016.  Chronic Plavix on hold.  Last dose AM 9/30.    *  Chronic pain: musculoskeletal and abdominal.  Regular oxycodone, excessive doses of ibuprofen along with Voltaren.      PLAN:     *  Ordered abdominal ultrasound for tomorrow AM, NPO after midnight in readiness for this.    *  EGD?Marland Kitchen  May be best to hold off on this until after  toe amputation and when Plavix and Xarelto will have been on hold for a few more days.    *  Add Protonix.    Jennye Moccasin  09/19/2017, 12:44 PM Pager: [phone]     Attending physician's note   I have taken a history, examined the patient and reviewed the chart. I agree with the Advanced Practitioner's note, impression and recommendations. Chronic intermittent abdominal pain and weight loss. Takes excessive amounts of NSAIDs. R/O ulcer, cholelithiasis, GERD, gastroparesis. His abdominal pain have a musculoskeletal component as well. Await abd [place] report. He will need an EGD when timing is acceptable for Xarelto hold and possibly Plavix hold. Minimize NSAID usage. Starting Protonix daily.   Claudette Head, MD Clementeen Graham [phone] Mon-Fri 8a-5p [phone] after 5p, weekends, holidays

## 2017-09-19 NOTE — Progress Notes (Addendum)
Spoke to Dr. [name] about when to hold pt's Xarelto order since pt is scheduled for surgery Wednesday 09/21/17. Received orders to hold today's and tomorrow's dose. Charted in [facility] and will pass information along to subsequent staff. Will continue to monitor  Also called [facility] in regards to pt's stomach cancer screening with Dr. Mortimer Fries on Thursday 09/22/17. Secretary stated she would send out a page so pt could be seen while in the hospital. Information passed along to pt and wife. Will continue to monitor

## 2017-09-19 NOTE — Progress Notes (Signed)
Inpatient Diabetes Program Recommendations  AACE/ADA: New Consensus Statement on Inpatient Glycemic Control (2015)  Target Ranges:  Prepandial:   less than 140 mg/dL      Peak postprandial:   less than 180 mg/dL (1-2 hours)      Critically ill patients:  140 - 180 mg/dL   Results for Joseph [name], Joseph [name] (MRN [id number]) as of 09/19/2017 10:30  Ref. Range 09/18/2017 06:47 09/18/2017 12:19 09/18/2017 13:18 09/18/2017 18:04 09/18/2017 21:35 09/19/2017 06:38  Glucose-Capillary Latest Ref Range: 65 - 99 mg/dL 409 (H) 811 (H) 914 (H) 368 (H) 240 (H) 240 (H)   Results for Joseph [name], Joseph [name] (MRN [id number]) as of 09/19/2017 10:30  Ref. Range 09/17/2017 06:18  Hemoglobin A1C Latest Ref Range: 4.8 - 5.6 % 9.5 (H)   Review of Glycemic Control  Diabetes history: DM2 Outpatient Diabetes medications: Lantus 70 units BID, Humalog 28-34 units TID with meals, Metformin 1000 mg BID, Victoza 1.2 mg daily Current orders for Inpatient glycemic control: Lantus 50 units QHS, Novolog 2 units TID with meals, Novolog 0-15 units TID with meals, Novolog 0-5 units QHS.  Inpatient Diabetes Program Recommendations:  Insulin - Basal: Please consider increasing Lantus to 55 units QHS. Insulin - Meal Coverage: Please consider increasing meal coverage to Novolog 6 units TID with meals. HgbA1C: A1C 9.5% on 09/17/17 indicating an average glucose of 226 mg/dl. Patient needs to follow up with PCP regarding DM control and would benefit from being referred to an Endocrinologist by PCP.  NOTE: Spoke with patient and his wife about diabetes and home regimen for diabetes control. Patient reports that he is followed by PCP for diabetes management and currently he takes Lantus 70 units BID, Humalog 28-34 units TID with meals, and Metformin 1000 mg BID as an outpatient for diabetes control. Inquired about Victoza (note on home medication list) and patient reports that he has been out of Victoza for months and that he has no refills left on it. Patient  requested prescription for Victoza at time of discharge.   Patient reports that he is taking Lantus, Humalog, and Metformin as prescribed. Patient states that he has not checked glucose is a while because his glucometer is broke. Patient requested  prescription for a new glucometer at discharge. Patient states that he doses Humalog insulin based on how he feels.  Inquired about prior A1C and patient reports that his last A1C value was in the 14% range. Discussed A1C results (9.5% on 09/17/17) and explained that his current A1C indicates an average glucose of 226 mg/dl over the past 2-3 months. Discussed glucose and A1C goals. Discussed importance of checking CBGs and maintaining good CBG control to prevent long-term and short-term complications. Explained how hyperglycemia leads to damage within blood vessels which lead to the common complications seen with uncontrolled diabetes. Stressed to the patient the importance of improving glycemic control to prevent further complications from uncontrolled diabetes especially to promote wound healing. Discussed impact of nutrition, exercise, stress, sickness, and medications on diabetes control. Patient states that he drinks regular soda and whole milk. Patient reports that he gets a headache from artifical sweeteners so he can not drink diet sodas. Encouraged patient to try to eliminate regular soda and drink more water and limit milk intake.  Encouraged patient to get new glucometer and to check his glucose as directed and to keep a log book of glucose readings and insulin taken which he will need to take to doctor appointments. Explained how the doctor he follows up with  can use the log book to continue to make insulin adjustments if needed. Inquired about seeing an Endocrinologist in the past and patient states that he was referred to an Endocrinologist but never got to see him. Asked patient to talk with his PCP about being referred to an Endocrinologist again.   Patient verbalized understanding of information discussed and he states that he has no further questions at this time related to diabetes.  MD, at time of discharge, please provide Rx for: glucometer and testing supplies and Victoza if continued at discharge (patient has ran out of Victoza).  Thanks, Orlando Penner, RN, MSN, CDE Diabetes Coordinator Inpatient Diabetes Program [phone] (Team Pager)

## 2017-09-19 NOTE — Progress Notes (Signed)
Patient places CPAP on self. RT will continue to monitor.

## 2017-09-19 NOTE — Progress Notes (Signed)
Pt's wife called for pain med. Pt was very sedated from previous pain med, so pain med not given. Advised wife to let pt sleep and to not give pain medication when pt was so sedated. cpap on.

## 2017-09-20 ENCOUNTER — Inpatient Hospital Stay (HOSPITAL_COMMUNITY): Payer: Medicare Other

## 2017-09-20 DIAGNOSIS — Z7902 Long term (current) use of antithrombotics/antiplatelets: Secondary | ICD-10-CM

## 2017-09-20 DIAGNOSIS — E11628 Type 2 diabetes mellitus with other skin complications: Secondary | ICD-10-CM

## 2017-09-20 DIAGNOSIS — R933 Abnormal findings on diagnostic imaging of other parts of digestive tract: Secondary | ICD-10-CM

## 2017-09-20 DIAGNOSIS — L97519 Non-pressure chronic ulcer of other part of right foot with unspecified severity: Secondary | ICD-10-CM

## 2017-09-20 DIAGNOSIS — R630 Anorexia: Secondary | ICD-10-CM

## 2017-09-20 DIAGNOSIS — L089 Local infection of the skin and subcutaneous tissue, unspecified: Secondary | ICD-10-CM

## 2017-09-20 LAB — BASIC METABOLIC PANEL
Anion gap: 6 (ref 5–15)
BUN: 18 mg/dL (ref 6–20)
CO2: 26 mmol/L (ref 22–32)
Calcium: 9 mg/dL (ref 8.9–10.3)
Chloride: 104 mmol/L (ref 101–111)
Creatinine, Ser: 0.93 mg/dL (ref 0.61–1.24)
GFR calc Af Amer: >60 mL/min (ref >60)
GFR calc non Af Amer: >60 mL/min (ref >60)
Glucose, Bld: 231 mg/dL — ABNORMAL HIGH (ref 65–99)
Potassium: 4.4 mmol/L (ref 3.5–5.1)
Sodium: 136 mmol/L (ref 135–145)

## 2017-09-20 LAB — GLUCOSE, CAPILLARY
Glucose-Capillary: 281 mg/dL — ABNORMAL HIGH (ref 65–99)
Glucose-Capillary: 307 mg/dL — ABNORMAL HIGH (ref 65–99)
Glucose-Capillary: 142 mg/dL — ABNORMAL HIGH (ref 65–99)
Glucose-Capillary: 149 mg/dL — ABNORMAL HIGH (ref 65–99)

## 2017-09-20 LAB — CBC
HCT: 34.1 % — ABNORMAL LOW (ref 39.0–52.0)
Hemoglobin: 11.1 g/dL — ABNORMAL LOW (ref 13.0–17.0)
MCH: 27.8 pg (ref 26.0–34.0)
MCHC: 32.6 g/dL (ref 30.0–36.0)
MCV: 85.3 fL (ref 78.0–100.0)
Platelets: 323 10*3/uL (ref 150–400)
RBC: 4 MIL/uL — ABNORMAL LOW (ref 4.22–5.81)
RDW: 12.8 % (ref 11.5–15.5)
WBC: 12.5 10*3/uL — ABNORMAL HIGH (ref 4.0–10.5)

## 2017-09-20 MED ORDER — INSULIN GLARGINE 100 UNIT/ML ~~LOC~~ SOLN
10.0000 [IU] | Freq: Every day | SUBCUTANEOUS | Status: DC
  Administered 2017-09-20: 10 [IU] via SUBCUTANEOUS
  Filled 2017-09-20 (×5): qty 0.1

## 2017-09-20 MED ORDER — GLUCERNA SHAKE PO LIQD
237.0000 mL | Freq: Three times a day (TID) | ORAL | Status: DC
  Administered 2017-09-20 – 2017-09-23 (×6): 237 mL via ORAL

## 2017-09-20 MED ORDER — PROMETHAZINE HCL 25 MG/ML IJ SOLN
12.5000 mg | Freq: Four times a day (QID) | INTRAMUSCULAR | Status: DC | PRN
  Administered 2017-09-20: 12.5 mg via INTRAVENOUS
  Filled 2017-09-20: qty 1

## 2017-09-20 NOTE — Care Management Important Message (Signed)
Important Message  Patient Details  Name: Joseph [name] MRN: [id number] Date of Birth: 1971-09-15   Medicare Important Message Given:  Yes    Dorena Bodo 09/20/2017, 12:59 PM

## 2017-09-20 NOTE — Progress Notes (Signed)
Initial Nutrition Assessment  DOCUMENTATION CODES:   Obesity unspecified  INTERVENTION:  Discontinue Ensure Enlive po BID, each supplement provides 350 kcal and 20 grams of protein  Provide Glucerna Shake po TID, each supplement provides 220 kcal and 10 grams of protein.  NUTRITION DIAGNOSIS:   Increased nutrient needs related to wound healing as evidenced by estimated needs.  GOAL:   Patient will meet greater than or equal to 90% of their needs  MONITOR:   PO intake, Supplement acceptance, Labs, Weight trends, Skin, I & O's  REASON FOR ASSESSMENT:   Malnutrition Screening Tool    ASSESSMENT:   46 y.o.malewith medical history of coronaryartery disease with history of PCI August 2017, diabetes mellitus, pulmonary embolus, tobacco abuse, essential hypertension, hyperlipidemia presented with one-week history of increasing right foot pain, swelling, and erythema about his right great toe.   Plans for amputation tomorrow. Pt was unavailable, in ultrasound, during time of visit. RD unable to obtain most recent nutrition history. Meal completion has been 25-100%. Pt currently has Ensure ordered. Noted CBGs elevated 175-307 mg/dL. RD to order Glucerna Shake instead of Ensure to aid in blood glucose maintainance.   Unable to complete Nutrition-Focused physical exam at this time.   Labs and medications reviewed.   Diet Order:  Diet heart healthy/carb modified Room service appropriate? Yes; Fluid consistency: Thin Diet NPO time specified  Skin:  Wound (see comment) (Diabetic ulcer R great toe, diabetic ulcer L finger)  Last BM:  9/27  Height:   Ht Readings from Last 1 Encounters:  09/16/17  (1.956 m)    Weight:   Wt Readings from Last 1 Encounters:  09/16/17 265 lb (120.2 kg)    Ideal Body Weight:  94.5 kg  BMI:  Body mass index is 31.42 kg/m.  Estimated Nutritional Needs:   Kcal:  2300-2500  Protein:  115-135 grams  Fluid:  Per MD  EDUCATION NEEDS:    No education needs identified at this time  Roslyn Smiling, MS, RD, LDN Pager # [phone] After hours/ weekend pager # [phone]

## 2017-09-20 NOTE — Progress Notes (Signed)
Inpatient Diabetes Program Recommendations  AACE/ADA: New Consensus Statement on Inpatient Glycemic Control (2015)  Target Ranges:  Prepandial:   less than 140 mg/dL      Peak postprandial:   less than 180 mg/dL (1-2 hours)      Critically ill patients:  140 - 180 mg/dL   Lab Results  Component Value Date   GLUCAP 281 (H) 09/20/2017   HGBA1C 9.5 (H) 09/17/2017    Review of Glycemic Control  Results for Joseph [name], Joseph [name] (MRN [id number]) as of 09/20/2017 09:20  Ref. Range 09/19/2017 13:47 09/19/2017 17:06 09/19/2017 22:12 09/20/2017 04:34 09/20/2017 06:36  Glucose-Capillary Latest Ref Range: 65 - 99 mg/dL  409 (H) 811 (H)  914 (H)   Diabetes history: DM2 Outpatient Diabetes medications: Lantus 70 units BID, Humalog 28-34 units TID with meals, Metformin 1000 mg BID, Victoza 1.2 mg daily Current orders for Inpatient glycemic control: Lantus 55 units QHS, Novolog 5 units TID with meals, Novolog 0-15 units TID with meals, Novolog 0-5 units QHS.  Inpatient Diabetes Program Recommendations: Fasting blood sugars elevated- 281 mg/dl- consider increasing Lantus to 60 units qhs.   Consider increasing Novolog mealtime to 6 units tid  Susette Racer, RN, [place], [place], CDE Diabetes Coordinator Inpatient Diabetes Program  [phone] (Team Pager) [phone] [facility] Office) 09/20/2017 9:25 AM

## 2017-09-20 NOTE — Progress Notes (Signed)
PROGRESS NOTE    Joseph [name]  ZOX:[id number] DOB: 1971/12/03 DOA: 09/16/2017 PCP: Marva Panda, NP    Brief Narrative: Joseph [name] is a 46 y.o. male with medical history of coronary artery disease with history of PCI August 2017, diabetes mellitus, pulmonary embolus, tobacco abuse, essential hypertension, hyperlipidemia presented with one-week history of increasing right foot pain, swelling, and erythema about his right great toe.  Assessment & Plan:   Active Problems:   Essential hypertension   OSA (obstructive sleep apnea)   CAD (coronary artery disease)   Chronic ulcer of great toe of right foot (HCC)   Diabetic foot infection (HCC)   Uncontrolled type 2 diabetes mellitus with hyperglycemia, with long-term current use of insulin (HCC)   Diabetic foot infection / right foot cellulitis:MRI sig for osteomyelitis. Afebrile, but with significant leukocytosis.  Orthopedics consulted and plan for surgical debridement on Wednesday.  ABI is pending.  Resume broad spectrum IV antibiotics.  Resume IV vancomycin, flagyl and rocephin. Day 4 of antibiotics.  Pain control.  CRP is 8.9.   Hyperkalemia :  Resolved. With IV FLUIDS.  ACE inhibitor on hold.    CAD: No chest pain.  S/p LCX PCI in 07/2016.    Diabetes Mellitus:  CBG (last 3)   Recent Labs  09/20/17 0636 09/20/17 1107 09/20/17 1617  GLUCAP 281* 307* 142*    Resume SSI.  hgba1c is 9.5. lantus 55 units, and add 10 more units in am,     Hyperlipidemia: Resume lipitor and fenofibrate.   H/o PE and DVT: - resume xarelto.   Acute encephalopathy:  Suspect from drug induced from benzo's vs opiod use. He became alert and oriented once he received the narcan.  Would recommend decreasing the dose of oxy from 15 to 10 mg but patient's wife is adamant about the 15 mg , 5 times daily. Will change to PRN.  Would also decrease the dose of xanax to 1 mg 4 times scheduled to prn.  abg unremarkable.  CXR does  not show any acute pathology EKG unchanged. CT head without contrast , no acute pathology.  UDS is positive for benzodiazepines.    OSA: ON CPAP at night.  Ordered CPAP while inpatient.    Anemia:  - normocytic , hemoglobin stable around 12.    Abdominal pain:  US unremarkable.  GI consulted and plan for EGD on thursday  DVT prophylaxis: xarelto Code Status:  Full code.  Family Communication: family at bedside. Discussed with family at bedside.  Disposition Plan: pending OR and PT eval.    Consultants:   Orthopedics.   Procedures: MRI foot.   Antimicrobials: vancomycin , rocephin and flagyl since admission.   Subjective: Alert and requesting for meclizine.   Objective: Vitals:   09/19/17 1500 09/19/17 2022 09/20/17 0449 09/20/17 1500  BP: 123/90 122/89 103/62 124/84  Pulse: 86 89 91 98  Resp: Temp: 98.6 F (37 C) 97.7 F (36.5 C) (!) 97.5 F (36.4 C) 98 F (36.7 C)  TempSrc: Oral Oral Oral Oral  SpO2: 96% 96% 98% 100%  Weight:      Height:        Intake/Output Summary (Last 24 hours) at 09/20/17 1914 Last data filed at 09/20/17 1700  Gross per 24 hour  Intake             1080 ml  Output                0  ml  Net             1080 ml   Filed Weights   09/16/17 1244  Weight: 120.2 kg (265 lb)    Examination:  General exam: Appears calm and comfortable.  Respiratory system: good air entry bilateral. No wheezing or rhonchi.  Cardiovascular system: S1 S2 heard, RRR, no JVD, no murmer. Left leg edema >right leg edema.   Gastrointestinal system: Abdomen is soft NT ND BS+. Central nervous system: ALERT AND oriented, non focal .  Extremities: right foot ulcer with foul smelling drainage, with worsening surrounding cellulitis .  Skin: No rashes, lesions or ulcers Psychiatry: mood and affect good.     Data Reviewed: I have personally reviewed following labs and imaging studies  CBC:  Recent Labs Lab 09/16/17 1326 09/17/17 0618  09/18/17 0508 09/20/17 0434  WBC 14.0* 14.7* 16.0* 12.5*  NEUTROABS 10.0*  --   --   --   HGB 13.9 12.6* 12.3* 11.1*  HCT 42.4 38.0* 37.7* 34.1*  MCV 87.2 85.0 84.9 85.3  PLT 336 321 280 323   Basic Metabolic Panel:  Recent Labs Lab 09/16/17 1326 09/17/17 0618 09/18/17 0508 09/20/17 0434  NA 140 136 134* 136  K 5.4* 3.8 4.2 4.4  CL 105 105 102 104  CO2 GLUCOSE 266* 114* 233* 231*  BUN CREATININE 1.00 1.10 0.94 0.93  CALCIUM 9.5 8.7* 8.5* 9.0   GFR: Estimated Creatinine Clearance: 145.5 mL/min (by C-G formula based on SCr of 0.93 mg/dL). Liver Function Tests:  Recent Labs Lab 09/17/17 0618  AST 14*  ALT 12*  ALKPHOS 61  BILITOT 0.5  PROT 6.4*  ALBUMIN 3.1*   No results for input(s): LIPASE, AMYLASE in the last 168 hours. No results for input(s): AMMONIA in the last 168 hours. Coagulation Profile: No results for input(s): INR, PROTIME in the last 168 hours. Cardiac Enzymes:  Recent Labs Lab 09/18/17 1453  TROPONINI <0.03   BNP (last 3 results) No results for input(s): PROBNP in the last 8760 hours. HbA1C: No results for input(s): HGBA1C in the last 72 hours. CBG:  Recent Labs Lab 09/19/17 1706 09/19/17 2212 09/20/17 0636 09/20/17 1107 09/20/17 1617  GLUCAP 175* 241* 281* 307* 142*   Lipid Profile: No results for input(s): CHOL, HDL, LDLCALC, TRIG, CHOLHDL, LDLDIRECT in the last 72 hours. Thyroid Function Tests: No results for input(s): TSH, T4TOTAL, FREET4, T3FREE, THYROIDAB in the last 72 hours. Anemia Panel: No results for input(s): VITAMINB12, FOLATE, FERRITIN, TIBC, IRON, RETICCTPCT in the last 72 hours. Sepsis Labs: No results for input(s): PROCALCITON, LATICACIDVEN in the last 168 hours.  No results found for this or any previous visit (from the past 240 hour(s)).       Radiology Studies: US Abdomen Limited  Result Date: 09/19/2017 CLINICAL DATA:  Right upper quadrant pain. EXAM: ULTRASOUND ABDOMEN  LIMITED RIGHT UPPER QUADRANT COMPARISON:  None. FINDINGS: Gallbladder: 8 mm stone at gallbladder fundus. No gallbladder wall thickening. There is not a sonographic Murphy sign. Common bile duct: Diameter: 5 mm Liver: No focal lesion identified. Within normal limits in parenchymal echogenicity. Portal vein is patent on color Doppler imaging with normal direction of blood flow towards the liver. IMPRESSION: Small gallstone.  No evidence for gallbladder inflammation. No biliary dilatation. Electronically Signed   By: Richarda Overlie M.D.   On: 09/19/2017 13:53        Scheduled Meds: . aspirin  81 mg Oral  Daily  . atorvastatin  40 mg Oral q1800  . buPROPion  150 mg Oral BID  . feeding supplement (GLUCERNA SHAKE)  237 mL Oral TID BM  . fenofibrate  160 mg Oral Daily  . gabapentin  300 mg Oral QHS  . Influenza vac split quadrivalent PF  0.5 mL Intramuscular Tomorrow-1000  . insulin aspart  0-15 Units Subcutaneous TID WC  . insulin aspart  0-5 Units Subcutaneous QHS  . insulin aspart  5 Units Subcutaneous TID WC  . insulin glargine  10 Units Subcutaneous Daily  . insulin glargine  55 Units Subcutaneous QHS  . oxyCODONE  15 mg Oral 5 X Daily  . pantoprazole  40 mg Oral Daily  . pregabalin  100 mg Oral Daily  . pregabalin  50 mg Oral Q1400   Continuous Infusions: . cefTRIAXone (ROCEPHIN)  IV Stopped (09/19/17 1826)  . metronidazole Stopped (09/20/17 1402)  . vancomycin Stopped (09/20/17 1805)     LOS: 4 days    Time spent: 35 minutes.     Kathlen Mody, MD [facility] Pager [phone]  If 7PM-7AM, please contact night-coverage [email or web address] Password [email or web address] 09/20/2017, 7:14 PM

## 2017-09-20 NOTE — Progress Notes (Signed)
Daily Rounding Note  09/20/2017, 12:59 PM  LOS: 4 days   SUBJECTIVE:   Chief complaint: still has upper abd pain.  Tolerated breakfast      OBJECTIVE:         Vital signs in last 24 hours:    Temp:  [97.5 F (36.4 C)-98.6 F (37 C)] 97.5 F (36.4 C) (10/02 0449) Pulse Rate:  [86-91] 91 (10/02 0449) Resp:  [17-18] 17 (10/02 0449) BP: (103-123)/(62-90) 103/62 (10/02 0449) SpO2:  [96 %-98 %] 98 % (10/02 0449) Last BM Date: 09/15/17 Filed Weights   09/16/17 1244  Weight: 120.2 kg (265 lb)   General: looks chronically unwell.  obese   Heart: RRR Chest: no labored breathing, clear bil.   Abdomen: obese, soft, ND.  BS present, slight upper abd tenderness  Extremities: tenderness, swelling, erythema at right foot dorsum.   Neuro/Psych:  Oriented x 3.  No limb weakness or tremor.    Intake/Output from previous day: 10/01 0701 - 10/02 0700 In: 480 [P.O.:480] Out: -   Intake/Output this shift: Total I/O In: 360 [P.O.:360] Out: -   Lab Results:  Recent Labs  09/18/17 0508 09/20/17 0434  WBC 16.0* 12.5*  HGB 12.3* 11.1*  HCT 37.7* 34.1*  PLT 280 323   BMET  Recent Labs  09/18/17 0508 09/20/17 0434  NA 134* 136  K 4.2 4.4  CL 102 104  CO2 22 26  GLUCOSE 233* 231*  BUN 16 18  CREATININE 0.94 0.93  CALCIUM 8.5* 9.0   LFT No results for input(s): PROT, ALBUMIN, AST, ALT, ALKPHOS, BILITOT, BILIDIR, IBILI in the last 72 hours. PT/INR No results for input(s): LABPROT, INR in the last 72 hours. Hepatitis Panel No results for input(s): HEPBSAG, HCVAB, HEPAIGM, HEPBIGM in the last 72 hours.  Studies/Results: Ct Head Wo Contrast  Result Date: 09/18/2017 CLINICAL DATA:  Initial evaluation for acute altered mental status, unclear cause. EXAM: CT HEAD WITHOUT CONTRAST TECHNIQUE: Contiguous axial images were obtained from the base of the skull through the vertex without intravenous contrast. COMPARISON:   [facility] CT from 04/22/2017. FINDINGS: Brain: Cerebral volume within normal limits for patient age. No evidence for acute intracranial hemorrhage. No findings to suggest acute large vessel territory infarct. No mass lesion, midline shift, or mass effect. Ventricles are normal in size without evidence for hydrocephalus. No extra-axial fluid collection identified. Vascular: No hyperdense vessel identified.Mild atheromatous plaque within the carotid siphons bilaterally. Skull: Scalp soft tissues demonstrate no acute abnormality.Calvarium intact. Sinuses/Orbits: Globes and orbital soft tissues are within normal limits. Mild layering opacity within the sphenoid sinuses bilaterally. Visualized paranasal sinuses otherwise clear. Trace opacity noted within the bilateral mastoid air cells, of doubtful significance. IMPRESSION: 1. No acute intracranial process. 2. Mild sphenoid sinus disease. Electronically Signed   By: Rise Mu M.D.   On: 09/18/2017 19:06   US Abdomen Limited  Result Date: 09/19/2017 CLINICAL DATA:  Right upper quadrant pain. EXAM: ULTRASOUND ABDOMEN LIMITED RIGHT UPPER QUADRANT COMPARISON:  None. FINDINGS: Gallbladder: 8 mm stone at gallbladder fundus. No gallbladder wall thickening. There is not a sonographic Murphy sign. Common bile duct: Diameter: 5 mm Liver: No focal lesion identified. Within normal limits in parenchymal echogenicity. Portal vein is patent on color Doppler imaging with normal direction of blood flow towards the liver. IMPRESSION: Small gallstone.  No evidence for gallbladder inflammation. No biliary dilatation. Electronically Signed   By: Richarda Overlie M.D.   On: 09/19/2017 13:53  Dg Chest Port 1 View  Result Date: 09/18/2017 CLINICAL DATA:  Hypoxia.  Chronic chest pain. EXAM: PORTABLE CHEST 1 VIEW COMPARISON:  12/20/2016 and prior radiographs FINDINGS: The cardiomediastinal silhouette is unremarkable. There is no evidence of focal airspace disease, pulmonary edema,  suspicious pulmonary nodule/mass, pleural effusion, or pneumothorax. No acute bony abnormalities are identified. IMPRESSION: No active disease. Electronically Signed   By: Harmon Pier M.D.   On: 09/18/2017 14:36   Scheduled Meds: . aspirin  81 mg Oral Daily  . atorvastatin  40 mg Oral q1800  . buPROPion  150 mg Oral BID  . feeding supplement (ENSURE ENLIVE)  237 mL Oral BID BM  . fenofibrate  160 mg Oral Daily  . gabapentin  300 mg Oral QHS  . Influenza vac split quadrivalent PF  0.5 mL Intramuscular Tomorrow-1000  . insulin aspart  0-15 Units Subcutaneous TID WC  . insulin aspart  0-5 Units Subcutaneous QHS  . insulin aspart  5 Units Subcutaneous TID WC  . insulin glargine  10 Units Subcutaneous Daily  . insulin glargine  55 Units Subcutaneous QHS  . oxyCODONE  15 mg Oral 5 X Daily  . pantoprazole  40 mg Oral Daily  . pregabalin  100 mg Oral Daily  . pregabalin  50 mg Oral Q1400   Continuous Infusions: . cefTRIAXone (ROCEPHIN)  IV Stopped (09/19/17 1826)  . metronidazole 500 mg (09/20/17 1302)  . vancomycin Stopped (09/20/17 0921)   PRN Meds:.acetaminophen **OR** acetaminophen, albuterol, ALPRAZolam, meclizine, naLOXone (NARCAN)  injection, ondansetron **OR** ondansetron (ZOFRAN) IV   ASSESMENT:   *  Chronic, intermittent abd pain.  Weight loss, anorexia. Heavy user of ibuprofen, voltaren Ultrasound today with small gallstone only: suspect this is not cause for pt sxs.  LFTs normal.     *  Osteomyelitis of toe.  For amputation tomorrow.  WBCs improved on triple antibiotics.    *  Hx DVT/PE.  xarelto on hold  *  07/2016 cardiac DES placement.  Plavix on hold, last dose 9/30.    *  Poorly controlled DM.     PLAN   *  EGD, will arrange for 10/4.   May ultimately need gastric emptying study.   Toe amputation tomorrow.   Keep on Protonix 40 mg daily.     Jennye Moccasin  09/20/2017, 12:59 PM Pager: [phone]     Attending physician's note   I have taken an interval  history, reviewed the chart and examined the patient. I agree with the Advanced Practitioner's note, impression and recommendations. Improved control of DM is necessary. EGD on 10/4 off Xarelto.  Claudette Head, MD [facility] [phone] Mon-Fri 8a-5p [phone] after 5p, weekends, holidays

## 2017-09-20 NOTE — Telephone Encounter (Signed)
Please review for refill, Thanks !  

## 2017-09-20 NOTE — Progress Notes (Signed)
Patient not in room when RT went to put on CPAP.  On 10-1 patient placed self on CPAP.  CPAP in room RT will continue to monitor.

## 2017-09-20 NOTE — Progress Notes (Signed)
**  Preliminary report by tech**  Right lower extremity venous duplex complete. There is no evidence of deep or superficial vein thrombosis involving the right lower extremity. All visualized vessels appear patent and compressible. There is no evidence of a Baker's cyst on the right.   ARTERIAL  ABI completed: Right ABI of 0.85 and left ABI of 0.88 are suggestive of mild arterial occlusive disease at rest. Left TBI of 1.02 is suggestive of arterial flow within normal limits at rest.   RIGHT    LEFT    PRESSURE WAVEFORM  PRESSURE WAVEFORM  BRACHIAL 111 Triphasic BRACHIAL 128 Triphasic  DP 104 Biphasic DP 112 Triphasic  PT 109 Biphasic PT 112 Triphasic  GREAT TOE Ulcer NA GREAT TOE 131 NA    RIGHT LEFT  ABI 0.85 0.88    Elsie Stain, RVT 09/20/2017, 12:25 PM

## 2017-09-21 ENCOUNTER — Inpatient Hospital Stay (HOSPITAL_COMMUNITY): Payer: Medicare Other | Admitting: Certified Registered Nurse Anesthetist

## 2017-09-21 ENCOUNTER — Encounter (HOSPITAL_COMMUNITY): Payer: Self-pay | Admitting: Orthopedic Surgery

## 2017-09-21 ENCOUNTER — Encounter (HOSPITAL_COMMUNITY)
Admission: EM | Disposition: A | Discharge status: Skilled Nursing Facility | Payer: Self-pay | Source: Home / Self Care | Attending: Internal Medicine

## 2017-09-21 DIAGNOSIS — E11628 Type 2 diabetes mellitus with other skin complications: Secondary | ICD-10-CM

## 2017-09-21 DIAGNOSIS — L089 Local infection of the skin and subcutaneous tissue, unspecified: Secondary | ICD-10-CM

## 2017-09-21 DIAGNOSIS — L97519 Non-pressure chronic ulcer of other part of right foot with unspecified severity: Secondary | ICD-10-CM

## 2017-09-21 DIAGNOSIS — E1165 Type 2 diabetes mellitus with hyperglycemia: Secondary | ICD-10-CM

## 2017-09-21 DIAGNOSIS — Z794 Long term (current) use of insulin: Secondary | ICD-10-CM

## 2017-09-21 DIAGNOSIS — G4733 Obstructive sleep apnea (adult) (pediatric): Secondary | ICD-10-CM

## 2017-09-21 DIAGNOSIS — R109 Unspecified abdominal pain: Secondary | ICD-10-CM

## 2017-09-21 HISTORY — PX: AMPUTATION: SHX166

## 2017-09-21 LAB — BASIC METABOLIC PANEL
Anion gap: 8 (ref 5–15)
BUN: 20 mg/dL (ref 6–20)
CO2: 27 mmol/L (ref 22–32)
Calcium: 9.3 mg/dL (ref 8.9–10.3)
Chloride: 101 mmol/L (ref 101–111)
Creatinine, Ser: 0.99 mg/dL (ref 0.61–1.24)
GFR calc Af Amer: >60 mL/min (ref >60)
GFR calc non Af Amer: >60 mL/min (ref >60)
Glucose, Bld: 199 mg/dL — ABNORMAL HIGH (ref 65–99)
Potassium: 4.3 mmol/L (ref 3.5–5.1)
Sodium: 136 mmol/L (ref 135–145)

## 2017-09-21 LAB — GLUCOSE, CAPILLARY
Glucose-Capillary: 159 mg/dL — ABNORMAL HIGH (ref 65–99)
Glucose-Capillary: 159 mg/dL — ABNORMAL HIGH (ref 65–99)
Glucose-Capillary: 127 mg/dL — ABNORMAL HIGH (ref 65–99)
Glucose-Capillary: 65 mg/dL (ref 65–99)
Glucose-Capillary: 67 mg/dL (ref 65–99)
Glucose-Capillary: 287 mg/dL — ABNORMAL HIGH (ref 65–99)

## 2017-09-21 LAB — SURGICAL PCR SCREEN
MRSA, PCR: NEGATIVE
Staphylococcus aureus: NEGATIVE

## 2017-09-21 SURGERY — AMPUTATION DIGIT
Anesthesia: General | Laterality: Right

## 2017-09-21 MED ORDER — FENTANYL CITRATE (PF) 250 MCG/5ML IJ SOLN
INTRAMUSCULAR | Status: AC
  Filled 2017-09-21: qty 5

## 2017-09-21 MED ORDER — PROPOFOL 10 MG/ML IV BOLUS
INTRAVENOUS | Status: DC | PRN
  Administered 2017-09-21: 200 mg via INTRAVENOUS

## 2017-09-21 MED ORDER — SODIUM CHLORIDE 0.9 % IV SOLN
INTRAVENOUS | Status: DC
  Administered 2017-09-21: via INTRAVENOUS

## 2017-09-21 MED ORDER — LIDOCAINE HCL (CARDIAC) 20 MG/ML IV SOLN
INTRAVENOUS | Status: DC | PRN
  Administered 2017-09-21: 100 mg via INTRAVENOUS

## 2017-09-21 MED ORDER — SODIUM CHLORIDE 0.9 % IR SOLN
Status: DC | PRN
  Administered 2017-09-21: 2000 mL

## 2017-09-21 MED ORDER — FENTANYL CITRATE (PF) 100 MCG/2ML IJ SOLN
INTRAMUSCULAR | Status: DC | PRN
  Administered 2017-09-21: 100 ug via INTRAVENOUS

## 2017-09-21 MED ORDER — MIDAZOLAM HCL 5 MG/5ML IJ SOLN
INTRAMUSCULAR | Status: DC | PRN
  Administered 2017-09-21: 2 mg via INTRAVENOUS

## 2017-09-21 MED ORDER — PROPOFOL 10 MG/ML IV BOLUS
INTRAVENOUS | Status: AC
  Filled 2017-09-21: qty 20

## 2017-09-21 MED ORDER — LACTATED RINGERS IV SOLN
INTRAVENOUS | Status: DC
  Administered 2017-09-21 – 2017-09-22 (×3): via INTRAVENOUS

## 2017-09-21 MED ORDER — DOXYCYCLINE HYCLATE 100 MG PO TABS
100.0000 mg | ORAL_TABLET | Freq: Two times a day (BID) | ORAL | Status: DC
  Administered 2017-09-21 – 2017-09-23 (×4): 100 mg via ORAL
  Filled 2017-09-21 (×4): qty 1

## 2017-09-21 MED ORDER — MIDAZOLAM HCL 2 MG/2ML IJ SOLN
INTRAMUSCULAR | Status: AC
  Filled 2017-09-21: qty 2

## 2017-09-21 MED ORDER — ONDANSETRON HCL 4 MG/2ML IJ SOLN
INTRAMUSCULAR | Status: DC | PRN
  Administered 2017-09-21: 4 mg via INTRAVENOUS

## 2017-09-21 MED ORDER — BUPIVACAINE HCL (PF) 0.25 % IJ SOLN
INTRAMUSCULAR | Status: AC
  Filled 2017-09-21: qty 30

## 2017-09-21 MED ORDER — ALPRAZOLAM 0.5 MG PO TABS
2.0000 mg | ORAL_TABLET | Freq: Every day | ORAL | Status: DC
  Administered 2017-09-21 – 2017-09-22 (×2): 2 mg via ORAL
  Filled 2017-09-21 (×2): qty 4

## 2017-09-21 SURGICAL SUPPLY — 46 items
BLADE AVERAGE 25X9 (BLADE) IMPLANT
BLADE LONG MED 31X9 (MISCELLANEOUS) ×2 IMPLANT
BNDG CMPR 9X4 STRL LF SNTH (GAUZE/BANDAGES/DRESSINGS) ×1
BNDG COHESIVE 4X5 TAN STRL (GAUZE/BANDAGES/DRESSINGS) ×1 IMPLANT
BNDG CONFORM 2 STRL LF (GAUZE/BANDAGES/DRESSINGS) ×2 IMPLANT
BNDG CONFORM 3 STRL LF (GAUZE/BANDAGES/DRESSINGS) IMPLANT
BNDG ESMARK 4X9 LF (GAUZE/BANDAGES/DRESSINGS) ×2 IMPLANT
CANISTER SUCT 3000ML PPV (MISCELLANEOUS) ×2 IMPLANT
CORDS BIPOLAR (ELECTRODE) IMPLANT
COVER SURGICAL LIGHT HANDLE (MISCELLANEOUS) ×2 IMPLANT
CUFF TOURNIQUET SINGLE 18IN (TOURNIQUET CUFF) IMPLANT
CUFF TOURNIQUET SINGLE 24IN (TOURNIQUET CUFF) IMPLANT
CUFF TOURNIQUET SINGLE 34IN LL (TOURNIQUET CUFF) IMPLANT
DRAPE SURG 17X23 STRL (DRAPES) IMPLANT
DRAPE U-SHAPE 47X51 STRL (DRAPES) ×1 IMPLANT
DURAPREP 26ML APPLICATOR (WOUND CARE) ×1 IMPLANT
ELECT CAUTERY BLADE 6.4 (BLADE) ×1 IMPLANT
ELECT REM PT RETURN 9FT ADLT (ELECTROSURGICAL) ×2
ELECTRODE REM PT RTRN 9FT ADLT (ELECTROSURGICAL) ×1 IMPLANT
FACESHIELD WRAPAROUND (MASK) ×2 IMPLANT
FACESHIELD WRAPAROUND OR TEAM (MASK) ×1 IMPLANT
GAUZE SPONGE 4X4 12PLY STRL (GAUZE/BANDAGES/DRESSINGS) ×1 IMPLANT
GAUZE XEROFORM 1X8 LF (GAUZE/BANDAGES/DRESSINGS) ×1 IMPLANT
GLOVE BIOGEL PI IND STRL 6.5 (GLOVE) IMPLANT
GLOVE BIOGEL PI INDICATOR 6.5 (GLOVE) ×3
GLOVE SKINSENSE NS SZ7.5 (GLOVE) ×1
GLOVE SKINSENSE STRL SZ7.5 (GLOVE) ×1 IMPLANT
GOWN STRL REIN XL XLG (GOWN DISPOSABLE) ×4 IMPLANT
HANDPIECE INTERPULSE COAX TIP (DISPOSABLE)
KIT BASIN OR (CUSTOM PROCEDURE TRAY) ×2 IMPLANT
KIT ROOM TURNOVER OR (KITS) ×2 IMPLANT
NDL HYPO 25GX1X1/2 BEV (NEEDLE) IMPLANT
NEEDLE HYPO 25GX1X1/2 BEV (NEEDLE) IMPLANT
NS IRRIG 1000ML POUR BTL (IV SOLUTION) ×2 IMPLANT
PACK ORTHO EXTREMITY (CUSTOM PROCEDURE TRAY) ×2 IMPLANT
PAD ARMBOARD 7.5X6 YLW CONV (MISCELLANEOUS) ×4 IMPLANT
SET HNDPC FAN SPRY TIP SCT (DISPOSABLE) IMPLANT
SPECIMEN JAR SMALL (MISCELLANEOUS) ×2 IMPLANT
SUT ETHILON 2 0 FS 18 (SUTURE) IMPLANT
SUT ETHILON 3 0 PS 1 (SUTURE) ×2 IMPLANT
SYR CONTROL 10ML LL (SYRINGE) IMPLANT
TOWEL OR 17X24 6PK STRL BLUE (TOWEL DISPOSABLE) ×2 IMPLANT
TOWEL OR 17X26 10 PK STRL BLUE (TOWEL DISPOSABLE) ×2 IMPLANT
TUBE CONNECTING 12X1/4 (SUCTIONS) IMPLANT
TUBING CYSTO DISP (UROLOGICAL SUPPLIES) IMPLANT
WATER STERILE IRR 1000ML POUR (IV SOLUTION) ×2 IMPLANT

## 2017-09-21 NOTE — Anesthesia Preprocedure Evaluation (Signed)
Anesthesia Evaluation  Patient identified by MRN, date of birth, ID band Patient awake    Reviewed: Allergy & Precautions, NPO status , Patient's Chart, lab work & pertinent test results  Airway Mallampati: I  TM Distance: >3 FB Neck ROM: Full    Dental   Pulmonary sleep apnea , COPD, Current Smoker,    Pulmonary exam normal        Cardiovascular hypertension, Pt. on medications + CAD and + Past MI  Normal cardiovascular exam     Neuro/Psych Anxiety Depression    GI/Hepatic GERD  Medicated and Controlled,  Endo/Other  diabetes, Type 2, Insulin Dependent  Renal/GU      Musculoskeletal   Abdominal   Peds  Hematology   Anesthesia Other Findings   Reproductive/Obstetrics                             Anesthesia Physical Anesthesia Plan  ASA: III  Anesthesia Plan: General   Post-op Pain Management:    Induction: Intravenous  PONV Risk Score and Plan: 1 and Ondansetron and Treatment may vary due to age or medical condition  Airway Management Planned: LMA  Additional Equipment:   Intra-op Plan:   Post-operative Plan: Extubation in OR  Informed Consent: I have reviewed the patients History and Physical, chart, labs and discussed the procedure including the risks, benefits and alternatives for the proposed anesthesia with the patient or authorized representative who has indicated his/her understanding and acceptance.     Plan Discussed with: CRNA and Surgeon  Anesthesia Plan Comments:         Anesthesia Quick Evaluation

## 2017-09-21 NOTE — Progress Notes (Signed)
Pharmacy Antibiotic Note  Joseph [name] is a 46 y.o. male admitted on 09/16/2017 with a diabetic/right foot infection and osteomyelitis on MRI. Pharmacy has been consulted for vancomycin dosing.  Patient is also on ceftriaxone and metronidazole.   Patient's renal function is stable and previous vancomycin trough is therapeutic.  Plan for amputation today.    Plan: Continue vanc 1gm IV Q8H for goal trough 15-20 mcg/mL Monitor renal fxn, clinical progress Repeat VT Thurs if still on abx post surgery F/u resume Xarelto post surgery, CBGs   Height:  (195.6 cm) Weight: 265 lb (120.2 kg) IBW/kg (Calculated) : 89.1  Temp (24hrs), Avg:97.9 F (36.6 C), Min:97.4 F (36.3 C), Max:98.3 F (36.8 C)   Recent Labs Lab 09/16/17 1326 09/17/17 0618 09/18/17 0508 09/18/17 1120 09/20/17 0434 09/21/17 0527  WBC 14.0* 14.7* 16.0*  --  12.5*  --   CREATININE 1.00 1.10 0.94  --  0.93 0.99  VANCOTROUGH  --   --   --  17  --   --     Estimated Creatinine Clearance: 136.7 mL/min (by C-G formula based on SCr of 0.99 mg/dL).    Allergies  Allergen Reactions  . Sulfa Antibiotics Other (See Comments)    headache    Vanc 9/28>> CTX 9/28>> Flagyl 9/28>>  9/30 VT = 17 mcg/mL on 1g q8 >> no change   Joseph [name], PharmD, BCPS Pager:  [phone] 09/21/2017, 7:46 AM

## 2017-09-21 NOTE — Op Note (Signed)
   Date of Surgery: 09/21/2017  INDICATIONS: Joseph [name] is a 46 y.o.-year-old male with a chronic right great toe ulcer with osteomyelitis;  The patient did consent to the procedure after discussion of the risks and benefits.  PREOPERATIVE DIAGNOSIS: Chronic right great toe ulcer with underlying osteomyelitis  POSTOPERATIVE DIAGNOSIS: Same.  PROCEDURE:  1. Amputation of right great toe through MTP joint 2. Adjacent tissue rearrangement of right foot 3 cm  SURGEON: N. Glee Arvin, M.D.  ASSIST: April Chilton Si, RNFA.  ANESTHESIA:  general  IV FLUIDS AND URINE: See anesthesia.  ESTIMATED BLOOD LOSS: minimal mL.  IMPLANTS: none  DRAINS: none  COMPLICATIONS: None.  DESCRIPTION OF PROCEDURE: The patient was brought to the operating room and placed supine on the operating table.  The patient had been signed prior to the procedure and this was documented. The patient had the anesthesia placed by the anesthesiologist.  A time-out was performed to confirm that this was the correct patient, site, side and location. The patient did receive antibiotics prior to the incision and was re-dosed during the procedure as needed at indicated intervals.  A tourniquet was not placed.  The patient had the operative extremity prepped and draped in the standard surgical fashion.    A medially based modified racquet type incision was created due to the chronic ulcer and soft tissue loss.  Full thickness flaps were elevated.  Sharp dissection was performed down to the MTP joint with a #15 blade.  Amputation through the MTP joint was performed.  The neurovascular bundles were identified and cauterized and sharp transected and allowed to retract.  The tissue edges exhibited good perfusion.  The wound was then thoroughly irrigated.  Adjacent tissue rearrangement was performed to fully close the wound without tension using 3.0 nylon.  Sterile dressings were applied.  Patient tolerated the procedure well and was  transferred to the PACU in stable condition.  POSTOPERATIVE PLAN: He may heel weight bear with Darco shoe.  He needs to complete 2 weeks of doxycycline 100 mg bid.  Joseph Reel, MD [facility] [phone] 5:21 PM

## 2017-09-21 NOTE — Progress Notes (Signed)
Having abdominal pain yesterday.  His toe amputation is set for this afternoon.    Reviewed labs, vitals.  Did not examine pt who is sleeping quietly with bipap in place.  Assessment:  *  Unexplained abdominal pain in pt with type 2 IDDM.    *  Right hallux osteo. For amputation today  *  Chronic Xarelto and Plavix.  Both on hold for 3 days.    Plan  *  Amputation today  *  EGD 9 AM tomorrow.   Sarah Gribbin PA-C [phone].      Attending physician's note   I have taken an interval history, reviewed the chart and examined the patient. I agree with the Advanced Practitioner's note, impression and recommendations.   Emry Tobin, MD FACG [phone] Mon-Fri 8a-5p [phone] after 5p, weekends, holidays 

## 2017-09-21 NOTE — Progress Notes (Signed)
CPAP plugged into red outlet and water added. Patient states he would like to put on himself tonight. Will continue to monitor.

## 2017-09-21 NOTE — Progress Notes (Addendum)
PROGRESS NOTE   Joseph [name]  ZOX:[id number]    DOB: 1971-06-04    DOA: 09/16/2017  PCP: Marva Panda, NP   I have briefly reviewed patients previous medical records in [facility].  Brief Narrative:  46 year old male with PMH of CAD status post PCI August 2017, DM 2, PE/DVT on Xarelto, tobacco abuse, HTN, HLD, chronic right foot wound, presented with increasing right foot pain, swelling, redness around his right great toe. Seen by orthopedics Dr. [name] on 09/09/17, MRI was ordered but not done yet, presented with progressively worsening symptoms. Admitted for right big toe osteomyelitis. Presented to [facility] and then was transferred to [facility] for orthopedic evaluation.   Assessment & Plan:   Active Problems:   Essential hypertension   OSA (obstructive sleep apnea)   CAD (coronary artery disease)   Chronic ulcer of great toe of right foot (HCC)   Diabetic foot infection (HCC)   Uncontrolled type 2 diabetes mellitus with hyperglycemia, with long-term current use of insulin (HCC)   1. Osteomyelitis of right great toe/diabetic foot infection: Orthopedic input appreciated. Continue IV ceftriaxone, metronidazole and vancomycin. Plan for right great toe amputation on 10/3. Seen this morning prior to surgery. 2. Hyperkalemia: Resolved. Lisinopril discontinued. 3. CAD status post LCx PCI 07/2016: No anginal symptoms. Currently on aspirin. Xarelto and Plavix on hold for procedures. 4. Poorly controlled DM 2 with peripheral neuropathy: CBGs in the hospital mostly in the 140s-150s range. Continue current dose of Lantus, NovoLog meal time and SSI. Monitor closely. Holding metformin and Victoza. Hemoglobin A1c 9.5. 5. History of DVT/PE: Xarelto on hold for right great toe amputation on 10/3 and EGD on 10/4. Resume when cleared by proceduralists. 6. HLD: Continue Lipitor and fenofibrate: 7. Chronic pain: Continue pain management as discussed below. 8. Anxiety and  depression: Stable without suicidal or homicidal ideations. Remains on Wellbutrin and Xanax. 9. COPD/tobacco abuse: Smoking cessation counseled. 10. Acute metabolic encephalopathy: Noted on 9/30. Suspected drug-induced? Benzodiazepines or opioids. Became alert and oriented after Narcan. Patient/family insisted and refused change in oxycodone dose 15 mg 5 times daily. Monitor closely. Noted that Xanax was changed from scheduled to when necessary but given chronicity of use, would marginally reduced the dose otherwise risk of withdrawal/seizures. 11. OSA: Continue nightly CPAP. 12. Chronic intermittent abdominal pain, weight loss, anorexia: [place] [facility] input appreciated. History of excessive use of NSAIDs. Need to rule out gastritis, ulcers, gastroparesis given chronic opioid use. Ultrasound showed small gallstone. LFTs normal. Plan for EGD 10/4 and may need GES. Continue Protonix.  13. Anemia: Follow CBCs. 14. Obesity/Body mass index is 31.42 kg/m.      DVT prophylaxis: Xarelto on hold Code Status: Full Family Communication: Discussed with spouse at bedside.  Disposition: DC home when medically improved.   Consultants:  [facility]   Procedures:  None   Antimicrobials:  IV vancomycin, metronidazole and ceftriaxone.    Subjective: Seen this morning. Spouse at bedside. Reports chronic bilateral lower extremity pain but right foot greater than left. Did not complain of abdominal pain until asked then reported mild nonspecific pain. No nausea or vomiting.   ROS: No chest pain, dyspnea, dizziness or lightheadedness.   Objective:  Vitals:   09/20/17 1500 09/20/17 1956 09/21/17 0654 09/21/17 1400  BP: 124/84 128/83 120/82   Pulse: 98 96 89   Resp: Temp: 98 F (36.7 C) 98.3 F (36.8 C) (!) 97.4 F (36.3 C)   TempSrc: Oral  Oral Oral   SpO2: 100% 98% 98%   Weight:    120.2 kg (265 lb)  Height:    6' 5.01" (1.956 m)    Examination:  General exam: Pleasant  young male, moderately built and obese, sitting up comfortably in bed.  Respiratory system: Clear to auscultation. Respiratory effort normal. Cardiovascular system: S1 & S2 heard, RRR. No JVD, murmurs, rubs, gallops or clicks. No pedal edema. Gastrointestinal system: Abdomen is nondistended, soft and nontender. No organomegaly or masses felt. Normal bowel sounds heard. Central nervous system: Alert and oriented. No focal neurological deficits. Extremities: Symmetric 5 x 5 power. Skin: Right foot wound dressing clean and dry. Psychiatry: Judgement and insight appear normal. Mood & affect flat     Data Reviewed: I have personally reviewed following labs and imaging studies  CBC:  Recent Labs Lab 09/16/17 1326 09/17/17 0618 09/18/17 0508 09/20/17 0434  WBC 14.0* 14.7* 16.0* 12.5*  NEUTROABS 10.0*  --   --   --   HGB 13.9 12.6* 12.3* 11.1*  HCT 42.4 38.0* 37.7* 34.1*  MCV 87.2 85.0 84.9 85.3  PLT 336 321 280 323   Basic Metabolic Panel:  Recent Labs Lab 09/16/17 1326 09/17/17 0618 09/18/17 0508 09/20/17 0434 09/21/17 0527  NA 140 136 134* 136 136  K 5.4* 3.8 4.2 4.4 4.3  CL 105 105 102 104 101  CO2 GLUCOSE 266* 114* 233* 231* 199*  BUN CREATININE 1.00 1.10 0.94 0.93 0.99  CALCIUM 9.5 8.7* 8.5* 9.0 9.3   Liver Function Tests:  Recent Labs Lab 09/17/17 0618  AST 14*  ALT 12*  ALKPHOS 61  BILITOT 0.5  PROT 6.4*  ALBUMIN 3.1*   Cardiac Enzymes:  Recent Labs Lab 09/18/17 1453  TROPONINI <0.03   CBG:  Recent Labs Lab 09/20/17 1617 09/20/17 2127 09/21/17 0604 09/21/17 1150 09/21/17 1338  GLUCAP 142* 149* 159* 159* 127*    Recent Results (from the past 240 hour(s))  Surgical pcr screen     Status: None   Collection Time: 09/20/17 11:30 PM  Result Value Ref Range Status   MRSA, PCR NEGATIVE NEGATIVE Final   Staphylococcus aureus NEGATIVE NEGATIVE Final    Comment: (NOTE) The Xpert SA Assay (FDA approved for NASAL  specimens in patients 78 years of age and older), is one component of a comprehensive surveillance program. It is not intended to diagnose infection nor to guide or monitor treatment.          Radiology Studies: No results found.      Scheduled Meds: . [MAR Hold] aspirin  81 mg Oral Daily  . [MAR Hold] atorvastatin  40 mg Oral q1800  . [MAR Hold] buPROPion  150 mg Oral BID  . [MAR Hold] feeding supplement (GLUCERNA SHAKE)  237 mL Oral TID BM  . [MAR Hold] fenofibrate  160 mg Oral Daily  . [MAR Hold] gabapentin  300 mg Oral QHS  . [MAR Hold] Influenza vac split quadrivalent PF  0.5 mL Intramuscular Tomorrow-1000  . [MAR Hold] insulin aspart  0-15 Units Subcutaneous TID WC  . [MAR Hold] insulin aspart  0-5 Units Subcutaneous QHS  . [MAR Hold] insulin aspart  5 Units Subcutaneous TID WC  . [MAR Hold] insulin glargine  10 Units Subcutaneous Daily  . [MAR Hold] insulin glargine  55 Units Subcutaneous QHS  . [MAR Hold] oxyCODONE  15 mg Oral 5 X Daily  . [MAR Hold] pantoprazole  40 mg Oral Daily  . [MAR Hold] pregabalin  100 mg Oral Daily  . [MAR Hold] pregabalin  50 mg Oral Q1400   Continuous Infusions: . [MAR Hold] cefTRIAXone (ROCEPHIN)  IV Stopped (09/20/17 2151)  . lactated ringers 10 mL/hr at 09/21/17 1406  . [MAR Hold] metronidazole 500 mg (09/21/17 1055)  . [MAR Hold] vancomycin 1,000 mg (09/21/17 0830)     LOS: 5 days     HONGALGI,ANAND, MD, FACP, FHM. [facility] Pager [phone]  If 7PM-7AM, please contact night-coverage [email or web address] Password [email or web address] 09/21/2017, 4:29 PM

## 2017-09-21 NOTE — Transfer of Care (Signed)
Immediate Anesthesia Transfer of Care Note  Patient: Conroy Goracke Rodriguez  Procedure(s) Performed: RIGHT GREAT TOE AMPUTATION, POSSIBLE VAC (Right )  Patient Location: PACU  Anesthesia Type:General  Level of Consciousness: awake and alert   Airway & Oxygen Therapy: Patient Spontanous Breathing and Patient connected to nasal cannula oxygen  Post-op Assessment: Report given to RN and Post -op Vital signs reviewed and stable  Post vital signs: Reviewed and stable  Last Vitals:  Vitals:   09/21/17 0654 09/21/17 1739  BP: 120/82   Pulse: 89   Resp: 17   Temp: (!) 36.3 C 36.4 C  SpO2: 98%     Last Pain:  Vitals:   09/21/17 1739  TempSrc:   PainSc: Asleep      Patients Stated Pain Goal: 3 (09/20/17 2131)  Complications: No apparent anesthesia complications

## 2017-09-21 NOTE — Anesthesia Procedure Notes (Signed)
Procedure Name: LMA Insertion Date/Time: 09/21/2017 4:55 PM Performed by: Reine Just Pre-anesthesia Checklist: Patient identified, Emergency Drugs available, Suction available and Patient being monitored Patient Re-evaluated:Patient Re-evaluated prior to induction Oxygen Delivery Method: Circle system utilized Preoxygenation: Pre-oxygenation with 100% oxygen Induction Type: IV induction Ventilation: Mask ventilation without difficulty LMA: LMA inserted LMA Size: 5.0 Number of attempts: 1 Airway Equipment and Method: Patient positioned with wedge pillow Placement Confirmation: positive ETCO2 and breath sounds checked- equal and bilateral Tube secured with: Tape Dental Injury: Teeth and Oropharynx as per pre-operative assessment

## 2017-09-22 ENCOUNTER — Ambulatory Visit: Payer: Medicare Other | Admitting: Gastroenterology

## 2017-09-22 ENCOUNTER — Encounter (HOSPITAL_COMMUNITY)
Admission: EM | Disposition: A | Discharge status: Skilled Nursing Facility | Payer: Self-pay | Source: Home / Self Care | Attending: Internal Medicine

## 2017-09-22 ENCOUNTER — Encounter (HOSPITAL_COMMUNITY): Payer: Self-pay | Admitting: Certified Registered Nurse Anesthetist

## 2017-09-22 ENCOUNTER — Inpatient Hospital Stay (HOSPITAL_COMMUNITY): Payer: Medicare Other | Admitting: Anesthesiology

## 2017-09-22 DIAGNOSIS — K269 Duodenal ulcer, unspecified as acute or chronic, without hemorrhage or perforation: Secondary | ICD-10-CM

## 2017-09-22 DIAGNOSIS — I10 Essential (primary) hypertension: Secondary | ICD-10-CM

## 2017-09-22 DIAGNOSIS — K3184 Gastroparesis: Secondary | ICD-10-CM

## 2017-09-22 DIAGNOSIS — R634 Abnormal weight loss: Secondary | ICD-10-CM

## 2017-09-22 DIAGNOSIS — E1143 Type 2 diabetes mellitus with diabetic autonomic (poly)neuropathy: Secondary | ICD-10-CM

## 2017-09-22 DIAGNOSIS — R109 Unspecified abdominal pain: Secondary | ICD-10-CM

## 2017-09-22 HISTORY — PX: ESOPHAGOGASTRODUODENOSCOPY: SHX5428

## 2017-09-22 LAB — CBC
HCT: 33.6 % — ABNORMAL LOW (ref 39.0–52.0)
Hemoglobin: 11.1 g/dL — ABNORMAL LOW (ref 13.0–17.0)
MCH: 28.5 pg (ref 26.0–34.0)
MCHC: 33 g/dL (ref 30.0–36.0)
MCV: 86.2 fL (ref 78.0–100.0)
Platelets: 340 10*3/uL (ref 150–400)
RBC: 3.9 MIL/uL — ABNORMAL LOW (ref 4.22–5.81)
RDW: 13.2 % (ref 11.5–15.5)
WBC: 11.9 10*3/uL — ABNORMAL HIGH (ref 4.0–10.5)

## 2017-09-22 LAB — BASIC METABOLIC PANEL
Anion gap: 7 (ref 5–15)
BUN: 16 mg/dL (ref 6–20)
CO2: 25 mmol/L (ref 22–32)
Calcium: 8.7 mg/dL — ABNORMAL LOW (ref 8.9–10.3)
Chloride: 106 mmol/L (ref 101–111)
Creatinine, Ser: 1.03 mg/dL (ref 0.61–1.24)
GFR calc Af Amer: >60 mL/min (ref >60)
GFR calc non Af Amer: >60 mL/min (ref >60)
Glucose, Bld: 283 mg/dL — ABNORMAL HIGH (ref 65–99)
Potassium: 4.3 mmol/L (ref 3.5–5.1)
Sodium: 138 mmol/L (ref 135–145)

## 2017-09-22 LAB — GLUCOSE, CAPILLARY
Glucose-Capillary: 248 mg/dL — ABNORMAL HIGH (ref 65–99)
Glucose-Capillary: 208 mg/dL — ABNORMAL HIGH (ref 65–99)
Glucose-Capillary: 262 mg/dL — ABNORMAL HIGH (ref 65–99)
Glucose-Capillary: 373 mg/dL — ABNORMAL HIGH (ref 65–99)

## 2017-09-22 SURGERY — EGD (ESOPHAGOGASTRODUODENOSCOPY)
Anesthesia: Monitor Anesthesia Care

## 2017-09-22 MED ORDER — PROPOFOL 500 MG/50ML IV EMUL
INTRAVENOUS | Status: DC | PRN
  Administered 2017-09-22: 75 ug/kg/min via INTRAVENOUS

## 2017-09-22 MED ORDER — PROPOFOL 10 MG/ML IV BOLUS
INTRAVENOUS | Status: DC | PRN
  Administered 2017-09-22 (×2): 20 mg via INTRAVENOUS

## 2017-09-22 MED ORDER — RIVAROXABAN 20 MG PO TABS
20.0000 mg | ORAL_TABLET | Freq: Every day | ORAL | Status: DC
  Administered 2017-09-22: 20 mg via ORAL
  Filled 2017-09-22: qty 1

## 2017-09-22 MED ORDER — CLOPIDOGREL BISULFATE 75 MG PO TABS
75.0000 mg | ORAL_TABLET | Freq: Every day | ORAL | Status: DC
  Administered 2017-09-22 – 2017-09-23 (×2): 75 mg via ORAL
  Filled 2017-09-22 (×2): qty 1

## 2017-09-22 MED ORDER — INSULIN ASPART 100 UNIT/ML ~~LOC~~ SOLN
3.0000 [IU] | Freq: Three times a day (TID) | SUBCUTANEOUS | Status: DC
  Administered 2017-09-22: 3 [IU] via SUBCUTANEOUS

## 2017-09-22 MED ORDER — POLYETHYLENE GLYCOL 3350 17 G PO PACK
17.0000 g | PACK | Freq: Every day | ORAL | Status: DC
  Administered 2017-09-22 – 2017-09-23 (×2): 17 g via ORAL
  Filled 2017-09-22 (×2): qty 1

## 2017-09-22 MED ORDER — METOCLOPRAMIDE HCL 5 MG PO TABS
5.0000 mg | ORAL_TABLET | Freq: Three times a day (TID) | ORAL | Status: DC
  Administered 2017-09-22 – 2017-09-23 (×5): 5 mg via ORAL
  Filled 2017-09-22 (×5): qty 1

## 2017-09-22 NOTE — Anesthesia Postprocedure Evaluation (Signed)
Anesthesia Post Note  Patient: Joseph [name]  Procedure(s) Performed: ESOPHAGOGASTRODUODENOSCOPY (EGD) (N/A )     Patient location during evaluation: PACU Anesthesia Type: MAC Level of consciousness: awake and alert Pain management: pain level controlled Vital Signs Assessment: post-procedure vital signs reviewed and stable Respiratory status: spontaneous breathing, nonlabored ventilation, respiratory function stable and patient connected to nasal cannula oxygen Cardiovascular status: stable and blood pressure returned to baseline Postop Assessment: no apparent nausea or vomiting Anesthetic complications: no    Last Vitals:  Vitals:   09/22/17 0913 09/22/17 0920  BP: 126/75 125/82  Pulse: 99 97  Resp: 16 20  Temp: (!) 36.4 C   SpO2: 97% 93%    Last Pain:  Vitals:   09/22/17 0913  TempSrc: Oral  PainSc:                  Paulene Tayag S

## 2017-09-22 NOTE — Progress Notes (Signed)
PROGRESS NOTE   Joseph [name]  HBZ:[id number]    DOB: 1971/09/02    DOA: 09/16/2017  PCP: [name], NP   I have briefly reviewed patients previous medical records in [facility].  Brief Narrative:  46 year old male with PMH of CAD status post PCI August 2017, DM 2, PE/DVT on Xarelto, tobacco abuse, HTN, HLD, chronic right foot wound, presented with increasing right foot pain, swelling, redness around his right great toe. Seen by orthopedics Dr. Erlinda Hong on 09/09/17, MRI was ordered but not done yet, presented with progressively worsening symptoms. Admitted for right big toe osteomyelitis. Presented to [facility] and then was transferred to [facility] for orthopedic evaluation.   Assessment & Plan:   Active Problems:   Essential hypertension   OSA (obstructive sleep apnea)   CAD (coronary artery disease)   Chronic ulcer of great toe of right foot ([place])   Diabetic foot infection ([place])   Uncontrolled type 2 diabetes mellitus with hyperglycemia, with long-term current use of insulin (HCC)   Abdominal pain   Loss of weight   1. Osteomyelitis of right great toe/diabetic foot infection: Status post right great toe amputation on 10/3 by orthopedics. As per orthopedic follow-up, oral doxycycline 100 MG twice a day 2 weeks, heel weight bearing on Darco shoe, outpatient follow-up in office in 2 weeks. Discontinued IV ceftriaxone, metronidazole and vancomycin.  2. Hyperkalemia: Resolved. Lisinopril discontinued. 3. CAD status post LCx PCI 07/2016: No anginal symptoms. Prior to admission, patient was on aspirin, Plavix and Xarelto. As per cardiology follow-up 09/2016, he was supposed to have repeat stress test and not sure if this has happened. Obviously being on these 3 medications increase his bleeding risk. Discussed in detail with patient and his spouse and advised them to follow-up with his primary cardiologist to determine whether he needs to be on all these  medications. 4. Poorly controlled DM 2 with peripheral neuropathy: Overnight 10/3 developed hypoglycemia range CBGs in the mid 60s. Today they are in the mid 200s. Continue current dose of Lantus, NovoLog meal time and SSI. Monitor closely. Holding metformin and Victoza. Hemoglobin A1c 9.5. Today's high CBG's may be d/t missing insulins and diet for procedures. Monitor closely. 5. History of DVT/PE: Xarelto on hold for right great toe amputation on 10/3 and EGD on 10/4. Resumed Xarelto. 6. HLD: Continue Lipitor and fenofibrate: 7. Chronic pain: Continue pain management as discussed below. Opioids prescribed by his PCP.  8. Anxiety and depression: Stable without suicidal or homicidal ideations. Remains on Wellbutrin and Xanax. 9. COPD/tobacco abuse: Smoking cessation counseled. 10. Acute metabolic encephalopathy: Noted on 9/30. Suspected drug-induced? Benzodiazepines or opioids. Became alert and oriented after Narcan. Patient/family insisted and refused change in oxycodone dose 15 mg 5 times daily. Monitor closely. Discussed in detail with patient and spouse on 10/4. He takes Xanax 2 mg at bedtime scheduled daily and in the daytime uses variable amount off Xanax 1 mg up to 1-3 times daily as needed. Continue home dose of Xanax.  11. OSA: Continue nightly CPAP. 12. Chronic intermittent abdominal pain, weight loss, anorexia: [place] [facility] input appreciated. History of excessive use of NSAIDs. Ultrasound showed small gallstone. LFTs normal. EGD results as outlined below. GI recommendations as outlined below.  13. Anemia:stable  14. Obesity/Body mass index is 31.42 kg/m.      DVT prophylaxis: Xarelto on hold Code Status: Full Family Communication: Discussed with spouse at bedside.  Disposition: DC home when medically improved.   Consultants:  [facility] [place] [facility]   Procedures:  Right great toe amputation on 10/3  EGD 10/4  Impression:               - Normal esophagus.                            - A large amount of food (residue) in the                            stomach-gastroparesis suspected.                           - Duodenal erosions without bleeding.                           - Normal duodenal bulb.                           - No specimens collected. Moderate Sedation:      none/MAC Recommendation:           - Return patient to hospital ward for ongoing care.                           - Gastroparesis diet (low fiber, low fat) and                            diabetic (ADA) diet indefinitely.                           - Continue present medications.                           - Continue Pantoprazole 40 mg qam long term.                           - Reglan (metoclopramide) 5 mg PO TID ac & HS for 1                            month trial.                           - Miralax daily for constipation.                           - NSAIDs at appropriate dosing are ok, no need to                            avoid.                           - Avoid opioids, if at all possible, as they will                            exacerbate gastroparesis.                           -  GI follow up with Dr. Havery Moros in 1 month.                           - Consider elective repeat EGD or UGI series as                            outpatient to clear areas of the stomach not seen                            on EGD today.                           - OK to resume Xarelto (rivaroxaban) and Plavix at                            prior dose today from a GI standpoint. Refer to                            managing physician for further adjustment of                            therapy.  Antimicrobials:  IV vancomycin, metronidazole and ceftriaxone. Discontinued Oral doxycycline   Subjective: Seen this morning after EGD. Ongoing right >left lower extremity pain. No abdominal pain, nausea or vomiting reported. Verified home dose of opioids and Xanax.   ROS: No chest pain, dyspnea, dizziness or  lightheadedness.   Objective:  Vitals:   09/22/17 0940 09/22/17 0950 09/22/17 1019 09/22/17 1300  BP:   135/89 117/82  Pulse: 95 96 99 96  Resp: _0 Temp:   98.2 F (36.8 C) 98.1 F (36.7 C)  TempSrc:   Oral Oral  SpO2: 97% 99% 100% 97%  Weight:      Height:        Examination:  General exam: Pleasant young male, moderately built and obese, sitting up comfortably in bed. Stable without change.  Respiratory system: Clear to auscultation. Respiratory effort normal. Stable without change. Cardiovascular system: S1 & S2 heard, RRR. No JVD, murmurs, rubs, gallops or clicks. No pedal edema. stable without change. Gastrointestinal system: Abdomen is nondistended, soft and nontender. No organomegaly or masses felt. Normal bowel sounds heard. Central nervous system: Alert and oriented. No focal neurological deficits. Extremities: Symmetric 5 x 5 power. Skin: Right foot wound dressing clean and dry. stable without change  Psychiatry: Judgement and insight appear normal. Mood & affect flat     Data Reviewed: I have personally reviewed following labs and imaging studies  CBC:  Recent Labs Lab 09/16/17 1326 09/17/17 0618 09/18/17 0508 09/20/17 0434 09/22/17 0456  WBC 14.0* 14.7* 16.0* 12.5* 11.9*  NEUTROABS 10.0*  --   --   --   --   HGB 13.9 12.6* 12.3* 11.1* 11.1*  HCT 42.4 38.0* 37.7* 34.1* 33.6*  MCV 87.2 85.0 84.9 85.3 86.2  PLT 336 321 280 323 149   Basic Metabolic Panel:  Recent Labs Lab 09/17/17 0618 09/18/17 0508 09/20/17 0434 09/21/17 0527 09/22/17 0456  NA 136 134* 136 136 138  K 3.8 4.2 4.4 4.3 4.3  CL 105 102 104 101 106  CO2 _1 GLUCOSE  114* 233* 231* 199* 283*  BUN _0 CREATININE 1.10 0.94 0.93 0.99 1.03  CALCIUM 8.7* 8.5* 9.0 9.3 8.7*   Liver Function Tests:  Recent Labs Lab 09/17/17 0618  AST 14*  ALT 12*  ALKPHOS 61  BILITOT 0.5  PROT 6.4*  ALBUMIN 3.1*   Cardiac Enzymes:  Recent Labs Lab  09/18/17 1453  TROPONINI <0.03   CBG:  Recent Labs Lab 09/21/17 1753 09/21/17 1823 09/21/17 2157 09/22/17 0700 09/22/17 1113  GLUCAP 65 67 287* 248* 208*    Recent Results (from the past 240 hour(s))  Surgical pcr screen     Status: None   Collection Time: 09/20/17 11:30 PM  Result Value Ref Range Status   MRSA, PCR NEGATIVE NEGATIVE Final   Staphylococcus aureus NEGATIVE NEGATIVE Final    Comment: (NOTE) The Xpert SA Assay (FDA approved for NASAL specimens in patients 55 years of age and older), is one component of a comprehensive surveillance program. It is not intended to diagnose infection nor to guide or monitor treatment.          Radiology Studies: No results found.      Scheduled Meds: . ALPRAZolam  2 mg Oral QHS  . aspirin  81 mg Oral Daily  . atorvastatin  40 mg Oral q1800  . buPROPion  150 mg Oral BID  . clopidogrel  75 mg Oral Daily  . doxycycline  100 mg Oral Q12H  . feeding supplement (GLUCERNA SHAKE)  237 mL Oral TID BM  . fenofibrate  160 mg Oral Daily  . gabapentin  300 mg Oral QHS  . Influenza vac split quadrivalent PF  0.5 mL Intramuscular Tomorrow-1000  . insulin aspart  0-15 Units Subcutaneous TID WC  . insulin aspart  0-5 Units Subcutaneous QHS  . insulin aspart  5 Units Subcutaneous TID WC  . insulin glargine  10 Units Subcutaneous Daily  . insulin glargine  55 Units Subcutaneous QHS  . metoCLOPramide  5 mg Oral TID AC & HS  . oxyCODONE  15 mg Oral 5 X Daily  . pantoprazole  40 mg Oral Daily  . polyethylene glycol  17 g Oral Daily  . pregabalin  100 mg Oral Daily  . pregabalin  50 mg Oral Q1400  . rivaroxaban  20 mg Oral Q supper   Continuous Infusions: . lactated ringers Stopped (09/22/17 1021)     LOS: 6 days     Katrisha Segall, MD, FACP, FHM. [facility] Pager [phone]  If 7PM-7AM, please contact night-coverage [email or web address] Password [email or web address] 09/22/2017, 2:55 PM

## 2017-09-22 NOTE — Progress Notes (Signed)
Pt requesting to go outside to smoke. States other staff have allowed this. Explained to patient that this is a smoke free facility and he must remain on the unit as he is an inpatient.  Patient became argumentative. Discussed with Unit Director. No order noted in patient chart that allows for outside recreation. Dr. Waymon Amato on unit and in to speak with patient and verifies that patient must remain on the unit to receive treatment.  Will continue to observe patient for compliance. Physician explained liabilities to patient.

## 2017-09-22 NOTE — Progress Notes (Signed)
Patient will place self on CPAP when ready. RT will continue to monitor.  

## 2017-09-22 NOTE — Progress Notes (Signed)
   Subjective:  Patient reports pain as minimal.  Objective:   VITALS:   Vitals:   09/21/17 1828 09/21/17 2207 09/22/17 0309 09/22/17 0703  BP: (!) 126/99 122/82 111/75 105/75  Pulse: 96 (!) 107 100 (!) 106  Resp:  Temp: (!) 97.3 F (36.3 C) 98.7 F (37.1 C) 98.7 F (37.1 C) 97.7 F (36.5 C)  TempSrc:  Oral Oral Oral  SpO2: 98% 98% 98% 96%  Weight:      Height:        Neurologically intact Neurovascular intact Sensation intact distally Intact pulses distally Dorsiflexion/Plantar flexion intact Incision: dressing C/D/I and no drainage No cellulitis present Compartment soft   Lab Results  Component Value Date   WBC 11.9 (H) 09/22/2017   HGB 11.1 (L) 09/22/2017   HCT 33.6 (L) 09/22/2017   MCV 86.2 09/22/2017   PLT 340 09/22/2017     Assessment/Plan:  1 Day Post-Op   - Expected postop acute blood loss anemia - will monitor for symptoms - Up with PT/OT - heel weight bearing with darco shoe - needs 2 weeks of doxy 100 mg bid - f/u 2 weeks in my office  Glee Arvin 09/22/2017, 7:31 AM [phone]

## 2017-09-22 NOTE — H&P (View-Only) (Signed)
Having abdominal pain yesterday.  His toe amputation is set for this afternoon.    Reviewed labs, vitals.  Did not examine pt who is sleeping quietly with bipap in place.  Assessment:  *  Unexplained abdominal pain in pt with type 2 IDDM.    *  Right hallux osteo. For amputation today  *  Chronic Xarelto and Plavix.  Both on hold for 3 days.    Plan  *  Amputation today  *  EGD 9 AM tomorrow.   Jennye Moccasin PA-C [phone].      Attending physician's note   I have taken an interval history, reviewed the chart and examined the patient. I agree with the Advanced Practitioner's note, impression and recommendations.   Claudette Head, MD Clementeen Graham [phone] Mon-Fri 8a-5p [phone] after 5p, weekends, holidays

## 2017-09-22 NOTE — Op Note (Addendum)
[facility] Patient Name: Joseph [name] Procedure Date : 09/22/2017 MRN: [id number] Attending MD: Ladene Artist , MD Date of Birth: 13-Dec-1971 CSN: [id number] Age: 46 Admit Type: Inpatient Procedure:                Upper GI endoscopy Indications:              Upper abdominal pain, Weight loss Providers:                Pricilla Riffle. Fuller Plan, MD, Kingsley Plan, RN, Cletis Athens, Technician Referring MD:             [facility] Medicines:                Monitored Anesthesia Care Complications:            No immediate complications. Estimated Blood Loss:     Estimated blood loss: none. Procedure:                Pre-Anesthesia Assessment:                           - Prior to the procedure, a History and Physical                            was performed, and patient medications and                            allergies were reviewed. The patient's tolerance of                            previous anesthesia was also reviewed. The risks                            and benefits of the procedure and the sedation                            options and risks were discussed with the patient.                            All questions were answered, and informed consent                            was obtained. Prior Anticoagulants: The patient has                            taken Xarelto (rivaroxaban) and Plavix, last doses                            were 4 days prior to procedure. ASA Grade                            Assessment: III - A patient with severe systemic  disease. After reviewing the risks and benefits,                            the patient was deemed in satisfactory condition to                            undergo the procedure.                           After obtaining informed consent, the endoscope was                            passed under direct vision. Throughout the                            procedure, the  patient's blood pressure, pulse, and                            oxygen saturations were monitored continuously. The                            EG-[id number] ([id number]) scope was introduced through the                            mouth, and advanced to the second part of duodenum.                            The upper GI endoscopy was accomplished without                            difficulty. The patient tolerated the procedure                            well. Scope In: Scope Out: Findings:      The examined esophagus was normal.      A large amount of food (residue) was found in the gastric fundus, in the       gastric body and in the gastric antrum.      The exam of the stomach was otherwise normal however significant       portions of the stomach were obscured by retained food.      A few localized erosions without bleeding were found in the second       portion of the duodenum.      The duodenal bulb was normal. Impression:               - Normal esophagus.                           - A large amount of food (residue) in the                            stomach-gastroparesis suspected.                           - Duodenal erosions without bleeding.                           -  Normal duodenal bulb.                           - No specimens collected. Moderate Sedation:      none/MAC Recommendation:           - Return patient to hospital ward for ongoing care.                           - Gastroparesis diet (low fiber, low fat) and                            diabetic (ADA) diet indefinitely.                           - Continue present medications.                           - Continue Pantoprazole 40 mg qam long term.                           - Reglan (metoclopramide) 5 mg PO TID ac & HS for 1                            month trial.                           - Miralax daily for constipation.                           - NSAIDs at appropriate dosing are ok, no need to                             avoid.                           - Avoid opioids, if at all possible, as they will                            exacerbate gastroparesis.                           - GI follow up with Dr. Havery Moros in 1 month.                           - Consider elective repeat EGD or UGI series as                            outpatient to clear areas of the stomach not seen                            on EGD today.                           - OK to resume Xarelto (rivaroxaban) and Plavix at  prior dose today from a GI standpoint. Refer to                            managing physician for further adjustment of                            therapy. Procedure Code(s):        --- Professional ---                           [phone], Esophagogastroduodenoscopy, flexible,                            transoral; diagnostic, including collection of                            specimen(s) by brushing or washing, when performed                            (separate procedure) Diagnosis Code(s):        --- Professional ---                           K26.9, Duodenal ulcer, unspecified as acute or                            chronic, without hemorrhage or perforation                           R10.10, Upper abdominal pain, unspecified                           R63.4, Abnormal weight loss CPT copyright 2016 [employer]. All rights reserved. The codes documented in this report are preliminary and upon coder review may  be revised to meet current compliance requirements. Ladene Artist, MD 09/22/2017 9:22:18 AM This report has been signed electronically. Number of Addenda: 0

## 2017-09-22 NOTE — Anesthesia Preprocedure Evaluation (Signed)
Anesthesia Evaluation  Patient identified by MRN, date of birth, ID band Patient awake    Reviewed: Allergy & Precautions, NPO status , Patient's Chart, lab work & pertinent test results  Airway Mallampati: II  TM Distance: >3 FB Neck ROM: Full    Dental no notable dental hx.    Pulmonary sleep apnea , COPD, Current Smoker,    Pulmonary exam normal breath sounds clear to auscultation       Cardiovascular hypertension, + CAD and + Past MI  Normal cardiovascular exam Rhythm:Regular Rate:Normal     Neuro/Psych Anxiety negative neurological ROS     GI/Hepatic negative GI ROS, Neg liver ROS,   Endo/Other  diabetes  Renal/GU negative Renal ROS  negative genitourinary   Musculoskeletal negative musculoskeletal ROS (+)   Abdominal   Peds negative pediatric ROS (+)  Hematology negative hematology ROS (+)   Anesthesia Other Findings   Reproductive/Obstetrics negative OB ROS                             Anesthesia Physical Anesthesia Plan  ASA: III  Anesthesia Plan: MAC   Post-op Pain Management:    Induction: Intravenous  PONV Risk Score and Plan: 0  Airway Management Planned: Nasal Cannula  Additional Equipment:   Intra-op Plan:   Post-operative Plan:   Informed Consent: I have reviewed the patients History and Physical, chart, labs and discussed the procedure including the risks, benefits and alternatives for the proposed anesthesia with the patient or authorized representative who has indicated his/her understanding and acceptance.   Dental advisory given  Plan Discussed with: CRNA and Surgeon  Anesthesia Plan Comments:         Anesthesia Quick Evaluation

## 2017-09-22 NOTE — Addendum Note (Signed)
Addendum  created 09/22/17 1021 by Epifanio Lesches, CRNA   Anesthesia Intra Flowsheets edited

## 2017-09-22 NOTE — Anesthesia Postprocedure Evaluation (Signed)
Anesthesia Post Note  Patient: Joseph [name]  Procedure(s) Performed: RIGHT GREAT TOE AMPUTATION, POSSIBLE VAC (Right )     Patient location during evaluation: PACU Anesthesia Type: General Level of consciousness: awake and alert Pain management: pain level controlled Vital Signs Assessment: post-procedure vital signs reviewed and stable Respiratory status: spontaneous breathing, nonlabored ventilation, respiratory function stable and patient connected to nasal cannula oxygen Cardiovascular status: blood pressure returned to baseline and stable Postop Assessment: no apparent nausea or vomiting Anesthetic complications: no    Last Vitals:  Vitals:   09/22/17 1300 09/22/17 1518  BP: 117/82 124/78  Pulse: 96   Resp: 14 15  Temp: 36.7 C 36.7 C  SpO2: 97% 97%    Last Pain:  Vitals:   09/22/17 1805  TempSrc:   PainSc: 8                  Aowyn Rozeboom DAVID

## 2017-09-22 NOTE — Transfer of Care (Signed)
Immediate Anesthesia Transfer of Care Note  Patient: Joseph [name]  Procedure(s) Performed: ESOPHAGOGASTRODUODENOSCOPY (EGD) (N/A )  Patient Location: PACU  Anesthesia Type:MAC  Level of Consciousness: awake  Airway & Oxygen Therapy: Patient Spontanous Breathing  Post-op Assessment: Report given to RN and Post -op Vital signs reviewed and stable  Post vital signs: Reviewed and stable  Last Vitals:  Vitals:   09/22/17 0825 09/22/17 0826  BP:  (!) 140/91  Pulse:    Resp: 16   Temp: (!) 36.4 C   SpO2: 97%     Last Pain:  Vitals:   09/22/17 0825  TempSrc: Oral  PainSc: 10-Worst pain ever      Patients Stated Pain Goal: 4 (79/43/27 6147)  Complications: No apparent anesthesia complications

## 2017-09-22 NOTE — Interval H&P Note (Signed)
History and Physical Interval Note:  09/22/2017 8:48 AM  Joseph [name]  has presented today for surgery, with the diagnosis of abdominal pain, anorexia, weight loss  The various methods of treatment have been discussed with the patient and family. After consideration of risks, benefits and other options for treatment, the patient has consented to  Procedure(s): ESOPHAGOGASTRODUODENOSCOPY (EGD) (N/A) as a surgical intervention .  The patient's history has been reviewed, patient examined, no change in status, stable for surgery.  I have reviewed the patient's chart and labs.  Questions were answered to the patient's satisfaction.     Venita Lick. Russella Dar

## 2017-09-22 NOTE — Discharge Instructions (Addendum)
Information on my medicine - XARELTO (rivaroxaban)  This medication education was reviewed with me or my healthcare representative as part of my discharge preparation.  The pharmacist that spoke with me during my hospital stay was:  Lennon Alstrom, [facility]  WHY WAS Joseph [name] PRESCRIBED FOR YOU? Xarelto was prescribed to treat blood clots that may have been found in the veins of your legs (deep vein thrombosis) or in your lungs (pulmonary embolism) and to reduce the risk of them occurring again.  What do you need to know about Xarelto? The dose i one 20 mg tablet taken ONCE A DAY with your evening meal.  DO NOT stop taking Xarelto without talking to the health care provider who prescribed the medication.  Refill your prescription for 20 mg tablets before you run out.  After discharge, you should have regular check-up appointments with your healthcare provider that is prescribing your Xarelto.  In the future your dose may need to be changed if your kidney function changes by a significant amount.  What do you do if you miss a dose? If you are taking Xarelto TWICE DAILY and you miss a dose, take it as soon as you remember. You may take two 15 mg tablets (total 30 mg) at the same time then resume your regularly scheduled 15 mg twice daily the next day.  If you are taking Xarelto ONCE DAILY and you miss a dose, take it as soon as you remember on the same day then continue your regularly scheduled once daily regimen the next day. Do not take two doses of Xarelto at the same time.   Important Safety Information Xarelto is a blood thinner medicine that can cause bleeding. You should call your healthcare provider right away if you experience any of the following: ? Bleeding from an injury or your nose that does not stop. ? Unusual colored urine (red or dark brown) or unusual colored stools (red or black). ? Unusual bruising for unknown reasons. ? A serious fall or if you hit your head (even if  there is no bleeding).  Some medicines may interact with Xarelto and might increase your risk of bleeding while on Xarelto. To help avoid this, consult your healthcare provider or pharmacist prior to using any new prescription or non-prescription medications, including herbals, vitamins, non-steroidal anti-inflammatory drugs (NSAIDs) and supplements.  This website has more information on Xarelto: [email or web address].  Additional discharge instructions  Please get your medications reviewed and adjusted by your Primary MD.  Please request your Primary MD to go over all Hospital Tests and Procedure/Radiological results at the follow up, please get all Hospital records sent to your Prim MD by signing hospital release before you go home.  If you had Pneumonia of Lung problems at the Hospital: Please get a 2 view Chest X ray done in 6-8 weeks after hospital discharge or sooner if instructed by your Primary MD.  If you have Congestive Heart Failure: Please call your Cardiologist or Primary MD anytime you have any of the following symptoms:  1) 3 pound weight gain in 24 hours or 5 pounds in 1 week  2) shortness of breath, with or without a dry hacking cough  3) swelling in the hands, feet or stomach  4) if you have to sleep on extra pillows at night in order to breathe  Follow cardiac low salt diet and 1.5 lit/day fluid restriction.  If you have diabetes Accuchecks 4 times/day, Once in AM empty stomach and then before each  meal. Log in all results and show them to your primary doctor at your next visit. If any glucose reading is under 80 or above 300 call your primary MD immediately.  If you have Seizure/Convulsions/Epilepsy: Please do not drive, operate heavy machinery, participate in activities at heights or participate in high speed sports until you have seen by Primary MD or a Neurologist and advised to do so again.  If you had Gastrointestinal Bleeding: Please ask your Primary MD to  check a complete blood count within one week of discharge or at your next visit. Your endoscopic/colonoscopic biopsies that are pending at the time of discharge, will also need to followed by your Primary MD.  Get Medicines reviewed and adjusted. Please take all your medications with you for your next visit with your Primary MD  Please request your Primary MD to go over all hospital tests and procedure/radiological results at the follow up, please ask your Primary MD to get all Hospital records sent to his/her office.  If you experience worsening of your admission symptoms, develop shortness of breath, life threatening emergency, suicidal or homicidal thoughts you must seek medical attention immediately by calling 911 or calling your MD immediately  if symptoms less severe.  You must read complete instructions/literature along with all the possible adverse reactions/side effects for all the Medicines you take and that have been prescribed to you. Take any new Medicines after you have completely understood and accpet all the possible adverse reactions/side effects.   Do not drive or operate heavy machinery when taking Pain medications.   Do not take more than prescribed Pain, Sleep and Anxiety Medications  Special Instructions: If you have smoked or chewed Tobacco  in the last 2 yrs please stop smoking, stop any regular Alcohol  and or any Recreational drug use.  Wear Seat belts while driving.  Please note You were cared for by a hospitalist during your hospital stay. If you have any questions about your discharge medications or the care you received while you were in the hospital after you are discharged, you can call the unit and asked to speak with the hospitalist on call if the hospitalist that took care of you is not available. Once you are discharged, your primary care physician will handle any further medical issues. Please note that NO REFILLS for any discharge medications will be  authorized once you are discharged, as it is imperative that you return to your primary care physician (or establish a relationship with a primary care physician if you do not have one) for your aftercare needs so that they can reassess your need for medications and monitor your lab values.  You can reach the hospitalist office at phone [phone] or fax [phone]   If you do not have a primary care physician, you can call [phone] for a physician referral.

## 2017-09-23 ENCOUNTER — Telehealth: Payer: Self-pay

## 2017-09-23 ENCOUNTER — Encounter (HOSPITAL_COMMUNITY): Payer: Self-pay | Admitting: Gastroenterology

## 2017-09-23 LAB — GLUCOSE, CAPILLARY
Glucose-Capillary: 250 mg/dL — ABNORMAL HIGH (ref 65–99)
Glucose-Capillary: 296 mg/dL — ABNORMAL HIGH (ref 65–99)

## 2017-09-23 MED ORDER — POLYETHYLENE GLYCOL 3350 17 G PO PACK: 17.0000 g | PACK | Freq: Every day | ORAL | 0 refills | Status: DC

## 2017-09-23 MED ORDER — PANTOPRAZOLE SODIUM 40 MG PO TBEC: 40.0000 mg | DELAYED_RELEASE_TABLET | Freq: Every day | ORAL | 0 refills | Status: DC

## 2017-09-23 MED ORDER — INSULIN GLARGINE 100 UNIT/ML ~~LOC~~ SOLN
20.0000 [IU] | Freq: Every day | SUBCUTANEOUS | Status: DC
  Administered 2017-09-23: 20 [IU] via SUBCUTANEOUS
  Filled 2017-09-23: qty 0.2

## 2017-09-23 MED ORDER — DOXYCYCLINE HYCLATE 100 MG PO TABS: 100.0000 mg | ORAL_TABLET | Freq: Two times a day (BID) | ORAL | 0 refills | Status: DC

## 2017-09-23 MED ORDER — METOCLOPRAMIDE HCL 5 MG PO TABS: 5.0000 mg | ORAL_TABLET | Freq: Three times a day (TID) | ORAL | 0 refills | Status: DC

## 2017-09-23 MED ORDER — INSULIN ASPART 100 UNIT/ML ~~LOC~~ SOLN
6.0000 [IU] | Freq: Three times a day (TID) | SUBCUTANEOUS | Status: DC
  Administered 2017-09-23 (×2): 6 [IU] via SUBCUTANEOUS

## 2017-09-23 MED ORDER — BLOOD GLUCOSE MONITOR KIT: PACK | 0 refills | Status: DC

## 2017-09-23 NOTE — Progress Notes (Signed)
Orthopedic Tech Progress Note Patient Details:  Joseph [name] 03/16/1971 [id number]  Ortho Devices Type of Ortho Device: Darco shoe Ortho Device/Splint Location: rle Ortho Device/Splint Interventions: Application   [name] 09/23/2017, 12:12 PM

## 2017-09-23 NOTE — Discharge Summary (Signed)
Physician Discharge Summary  Joseph [name] XYV:[id number] DOB: 03-Mar-1971  PCP: [name], NP  Admit date: 09/16/2017 Discharge date: 09/23/2017  Recommendations for Outpatient Follow-up:  1. [name], NP/PCP in 4 days with repeat labs (CBC & BMP). 2. Dr. Eduard Roux, Orthopedics in 1 week for suture removal and wound recheck. 3. Dr. Wynne Cellar, [facility] in 1 month. 4. Dr. Skeet Latch, Cardiology  Home Health: None Equipment/Devices: Heel weightbearing with Darco shoe    Discharge Condition: Improved and stable  CODE STATUS: Full  Diet recommendation: Heart healthy & diabetic diet.  Discharge Diagnoses:  Active Problems:   Essential hypertension   OSA (obstructive sleep apnea)   CAD (coronary artery disease)   Chronic ulcer of great toe of right foot (HCC)   Diabetic foot infection ([name])   Uncontrolled type 2 diabetes mellitus with hyperglycemia, with long-term current use of insulin (HCC)   Abdominal pain   Loss of weight   Brief Summary: 46 year old male with PMH of CAD status post PCI August 2017, DM 2, PE/DVT on Xarelto, tobacco abuse, HTN, HLD, chronic right foot wound, presented with increasing right foot pain, swelling, redness around his right great toe. Seen by orthopedics Dr. Erlinda Hong on 09/09/17, MRI was ordered but not done yet, presented with progressively worsening symptoms. Admitted for right big toe osteomyelitis. Presented to [facility] and then was transferred to [facility] for orthopedic evaluation.   Assessment & Plan:   1. Osteomyelitis of right great toe/diabetic foot infection: Status post right great toe amputation on 10/3 by orthopedics. As per orthopedic follow-up, oral doxycycline 100 MG twice a day 2 weeks, heel weight bearing on Darco shoe, outpatient follow-up in office in 2 weeks. Discontinued IV ceftriaxone, metronidazole and vancomycin. I discussed with Dr.Xu and he has cleared patient for discharge  home. PT evaluation pending. 2. Hyperkalemia: Resolved. Lisinopril and lisinopril/HCTZ discontinued. 3. CAD status post LCx PCI 07/2016: No anginal symptoms. Prior to admission, patient was on aspirin, Plavix and Xarelto. As per cardiology follow-up 09/2016, he was supposed to have repeat stress test and not sure if this has happened. Obviously being on these 3 medications increase his bleeding risk. Discussed in detail with patient and his spouse and advised them to follow-up with his primary cardiologist to determine whether he needs to be on all these medications. 4. Poorly controlled DM 2 (IDDM) with peripheral neuropathy: Hemoglobin A1c 9.5. CBGs in the hospital with poorly controlled and fluctuating likely related to variable oral intake due to multiple procedures/nothing by mouth status and reduced insulin than he takes at home. He will resume prior home dose of Lantus, Humalog SSI and metformin. He does not appear to be taking Victoza PTA. He is on large doses of insulin's at home which she claims compliance to but has not been checking CBGs lately due to nonfunctioning glucometer-prescription for a new one provided. Consider outpatient endocrinology consultation. Counseled regarding importance of all aspects of diabetes care. 5. History of DVT/PE: Xarelto helped temporarily for right great toe amputation on 10/3 and EGD on 10/4. Resumed Xarelto. Patient advised to follow-up with PCP regarding duration of anticoagulation. 6. HLD: Continue Lipitor and fenofibrate: 7. Chronic pain: Continue pain management as discussed below. Opioids prescribed by his PCP. No new prescriptions for opioids or benzodiazepines were given. 8. Anxiety and depression: Stable without suicidal or homicidal ideations. Remains on Wellbutrin and Xanax at prior home dose. 9. COPD/tobacco abuse: Smoking cessation counseled. 10. Acute metabolic encephalopathy: Noted on 9/30.  Suspected drug-induced? Benzodiazepines or opioids. Became  alert and oriented after Narcan. Patient/family insisted and refused change in oxycodone dose 15 mg 5 times daily. Discussed in detail with patient and spouse on 10/4. He takes Xanax 2 mg at bedtime scheduled daily and in the daytime uses variable amount off Xanax 1 mg up to 1-3 times daily as needed. Continue home dose of Xanax. Resolved without recurrence. 11. OSA: Continue nightly CPAP. 12. Chronic intermittent abdominal pain, weight loss, anorexia/gastroparesis: [facility] input appreciated. History of excessive use of NSAIDs. Ultrasound showed small gallstone. LFTs normal. EGD results as outlined below: In summary residual food, duodenal erosions without bleeding and suspect due to gastroparesis from history of long-standing diabetes and opioid use. GI recommendations as outlined below. Tolerating diet. Continue Reglan and PPI as recommended by GI. Patient and spouse encouraged to discuss with PCP regarding tapering opioids if at all possible and may need to consider outpatient pain management consultation. In the hospital, pain seems to be adequately controlled with current regimen of home OxyIR and Lyrica. Although GI did not recommend absolutely stopping NSAIDs, discontinued NSAIDs due to risk of bleeding given that patient is on Xarelto and DAPT. Outpatient follow-up with GI regarding further evaluation of gastroparesis and weight loss. 13. Anemia:stable  14. Obesity/Body mass index is 31.42 kg/m.    Consultants:  Orthopedics [facility]   Procedures:  Right great toe amputation on 10/3  EGD 10/4  Impression: - Normal esophagus. - A large amount of food (residue) in the  stomach-gastroparesis suspected. - Duodenal erosions without bleeding. - Normal duodenal bulb. - No specimens collected.  Recommendation: - Return patient to  hospital ward for ongoing care. - Gastroparesis diet (low fiber, low fat) and  diabetic (ADA) diet indefinitely. - Continue present medications. - Continue Pantoprazole 40 mg qam long term. - Reglan (metoclopramide) 5 mg PO TID ac &HS for 1  month trial. - Miralax daily for constipation. - NSAIDs at appropriate dosing are ok, no need to  avoid. - Avoid opioids, if at all possible, as they will  exacerbate gastroparesis. - GI follow up with Dr. Havery Moros in 1 month. - Consider elective repeat EGD or UGI series as  outpatient to clear areas of the stomach not seen  on EGD today. - OK to resume Xarelto (rivaroxaban) and Plavix at  prior dose today from a GI standpoint. Refer to  managing physician for further adjustment of  therapy.   Discharge Instructions  Discharge Instructions    Call MD for:  difficulty breathing, headache or visual disturbances    Complete by:  As directed    Call MD for:  extreme fatigue    Complete by:  As directed    Call MD for:  persistant dizziness or light-headedness    Complete by:  As directed    Call MD for:  persistant nausea and vomiting    Complete by:  As directed    Call MD for:  redness, tenderness, or signs of infection (pain, swelling, redness, odor or green/yellow discharge around incision site)    Complete by:  As directed    Call MD for:  severe uncontrolled pain    Complete by:  As directed    Call MD for:  temperature >100.4    Complete by:  As  directed    Diet - low sodium heart healthy    Complete by:  As directed    Diet Carb Modified    Complete by:  As directed  Discharge instructions    Complete by:  As directed    heel weight bearing with darco shoe   Increase activity slowly    Complete by:  As directed        Medication List    STOP taking these medications   diclofenac 75 MG EC tablet Commonly known as:  VOLTAREN   fluconazole 100 MG tablet Commonly known as:  DIFLUCAN   fluticasone 50 MCG/ACT nasal spray Commonly known as:  FLONASE   gabapentin 300 MG capsule Commonly known as:  NEURONTIN   ibuprofen 800 MG tablet Commonly known as:  ADVIL,MOTRIN   lisinopril 5 MG tablet Commonly known as:  PRINIVIL,ZESTRIL   lisinopril-hydrochlorothiazide 20-12.5 MG tablet Commonly known as:  PRINZIDE,ZESTORETIC   loratadine 10 MG tablet Commonly known as:  CLARITIN   meloxicam 15 MG tablet Commonly known as:  MOBIC   naproxen 500 MG tablet Commonly known as:  NAPROSYN   neomycin-polymyxin-hydrocortisone 3.5-10000-1 OTIC suspension Commonly known as:  CORTISPORIN   tiZANidine 4 MG tablet Commonly known as:  ZANAFLEX   VICTOZA 18 MG/3ML Sopn Generic drug:  liraglutide     TAKE these medications   ALPRAZolam 1 MG tablet Commonly known as:  XANAX Take 1-2 mg by mouth 4 (four) times daily. 2 mg every evening   amLODipine 2.5 MG tablet Commonly known as:  NORVASC Take 2.5 mg by mouth every morning.   aspirin 81 MG chewable tablet Chew 1 tablet (81 mg total) by mouth daily.   atorvastatin 40 MG tablet Commonly known as:  LIPITOR Take 1 tablet (40 mg total) by mouth daily. What changed:  when to take this   blood glucose meter kit and supplies Kit Dispense based on patient and insurance preference. Use up to four times daily as directed. (FOR ICD-9 250.00, 250.01).   buPROPion 150 MG 12 hr tablet Commonly known as:  WELLBUTRIN SR Take 1 tablet by mouth 2 (two) times daily.   clopidogrel  75 MG tablet Commonly known as:  PLAVIX Take 1 tablet (75 mg total) by mouth daily with breakfast.   doxycycline 100 MG tablet Commonly known as:  VIBRA-TABS Take 1 tablet (100 mg total) by mouth 2 (two) times daily.   fenofibrate 145 MG tablet Commonly known as:  TRICOR Take 145 mg by mouth every morning.   HUMALOG KWIKPEN 100 UNIT/ML KiwkPen Generic drug:  insulin lispro Inject 35-40 Units into the skin 3 (three) times daily with meals. As directed per sliding scale   LANTUS SOLOSTAR 100 UNIT/ML Solostar Pen Generic drug:  Insulin Glargine Inject 70 Units into the skin 2 (two) times daily.   LYRICA 50 MG capsule Generic drug:  pregabalin Take 50-100 mg by mouth See admin instructions. 100 mg every morning and 50 mg every evening   meclizine 25 MG tablet Commonly known as:  ANTIVERT Take 25 mg by mouth 3 (three) times daily as needed for dizziness. 25 mg every morning and 25 mg every evening.  May take an additional 25 mg during the day   metFORMIN 1000 MG tablet Commonly known as:  GLUCOPHAGE Take 1,000 mg by mouth 2 (two) times daily with a meal.   metoCLOPramide 5 MG tablet Commonly known as:  REGLAN Take 1 tablet (5 mg total) by mouth 4 (four) times daily -  before meals and at bedtime.   nitroGLYCERIN 0.4 MG SL tablet Commonly known as:  NITROSTAT PLACE 1 TABLET UNDER THE TONGUE EVERY 5 MINUTES AS NEEDED FOR CHEST PAIN  What changed:  See the new instructions.   oxyCODONE 15 MG immediate release tablet Commonly known as:  ROXICODONE Take 15 mg by mouth 5 (five) times daily.   pantoprazole 40 MG tablet Commonly known as:  PROTONIX Take 1 tablet (40 mg total) by mouth daily. What changed:  See the new instructions.   polyethylene glycol packet Commonly known as:  MIRALAX / GLYCOLAX Take 17 g by mouth daily.   albuterol (2.5 MG/3ML) 0.083% nebulizer solution Commonly known as:  PROVENTIL Take 2.5 mg by nebulization every 4 (four) hours as needed for wheezing  or shortness of breath.   PROAIR HFA 108 (90 Base) MCG/ACT inhaler Generic drug:  albuterol Inhale 2 puffs into the lungs every 6 (six) hours as needed for wheezing or shortness of breath.   promethazine 25 MG tablet Commonly known as:  PHENERGAN Take 25 mg by mouth every 6 (six) hours as needed for nausea.   rivaroxaban 20 MG Tabs tablet Commonly known as:  XARELTO Take 1 tablet (20 mg total) by mouth daily. What changed:  additional instructions      Follow-up Information    Tarry Kos, MD Follow up in 1 week(s).   Specialty:  Orthopedic Surgery Why:  For suture removal, For wound re-check Contact information: [address] [name] [address] [phone]        Marva Panda, NP. Schedule an appointment as soon as possible for a visit in 4 day(s).   Why:  To be seen with repeat labs (CBC & BMP). Contact information: [facility] [address] [name] [address] [phone]        Benancio Deeds, MD Follow up in 1 month(s).   Specialty:  Gastroenterology Contact information: [address] [place] [address] [name] [address] [phone]        Chilton Si, MD. Schedule an appointment as soon as possible for a visit.   Specialty:  Cardiology Contact information: [address] [place] [address] [name] [address] [phone]          Allergies  Allergen Reactions  . Sulfa Antibiotics Other (See Comments)    headache      Procedures/Studies: Ct Head Wo Contrast  Result Date: 09/18/2017 CLINICAL DATA:  Initial evaluation for acute altered mental status, unclear cause. EXAM: CT HEAD WITHOUT CONTRAST TECHNIQUE: Contiguous axial images were obtained from the base of the skull through the vertex without intravenous contrast. COMPARISON:  [facility] CT from 04/22/2017. FINDINGS: Brain: Cerebral volume within normal limits for patient age. No evidence for acute intracranial hemorrhage. No findings to  suggest acute large vessel territory infarct. No mass lesion, midline shift, or mass effect. Ventricles are normal in size without evidence for hydrocephalus. No extra-axial fluid collection identified. Vascular: No hyperdense vessel identified.Mild atheromatous plaque within the carotid siphons bilaterally. Skull: Scalp soft tissues demonstrate no acute abnormality.Calvarium intact. Sinuses/Orbits: Globes and orbital soft tissues are within normal limits. Mild layering opacity within the sphenoid sinuses bilaterally. Visualized paranasal sinuses otherwise clear. Trace opacity noted within the bilateral mastoid air cells, of doubtful significance. IMPRESSION: 1. No acute intracranial process. 2. Mild sphenoid sinus disease. Electronically Signed   By: Rise Mu M.D.   On: 09/18/2017 19:06   Mr Foot Right Wo Contrast  Result Date: 09/16/2017 CLINICAL DATA:  Right great toe wound for months. History of diabetes and neuropathy. EXAM: MRI OF THE RIGHT FOREFOOT WITHOUT CONTRAST TECHNIQUE: Multiplanar, multisequence MR imaging of the right forefoot was performed.  No intravenous contrast was administered. COMPARISON:  Radiographs 09/16/2017 and 02/16/2011. FINDINGS: Despite efforts by the technologist and patient, mild motion artifact is present on today's exam and could not be eliminated. This reduces exam sensitivity and specificity. In addition, there is heterogeneous fat saturation in the toes on the T2 weighted images. Bones/Joint/Cartilage As above, there is heterogeneous fat saturation in the toes on the T2 weighted images. Sagittal inversion recovery images demonstrate T2 marrow hyperintensity in the distal phalanx of the great toe. There is mild joint space narrowing at the interphalangeal joint of the great toe with mildly decreased subchondral signal on coronal T1 weighted images. No cortical destruction is identified. There is no significant interphalangeal joint effusion. The additional  phalanges and metatarsals appear normal. The tibial sesamoid of the first metatarsal is bipartite. Ligaments The Lisfranc ligament is intact. Muscles and Tendons The forefoot muscles and tendons appear intact. No significant tenosynovitis. Probable mild forefoot myopathy. Soft tissues In correlation with recent radiographs, there is soft tissue ulceration along plantar aspect of the interphalangeal joint of the great toe. No drainable fluid collection is seen. There is mild subcutaneous edema throughout the visualized forefoot. IMPRESSION: 1. Soft tissue ulceration along the plantar aspect of the great toe at the level of the interphalangeal joint. No evidence of soft tissue abscess. 2. T2 marrow hyperintensity within the distal phalanx of the great toe is concerning for early osteomyelitis given the proximity to the adjacent soft tissue wound. No cortical destruction identified on T1 weighted images. Electronically Signed   By: Carey Bullocks M.D.   On: 09/16/2017 15:49   US Abdomen Limited  Result Date: 09/19/2017 CLINICAL DATA:  Right upper quadrant pain. EXAM: ULTRASOUND ABDOMEN LIMITED RIGHT UPPER QUADRANT COMPARISON:  None. FINDINGS: Gallbladder: 8 mm stone at gallbladder fundus. No gallbladder wall thickening. There is not a sonographic Murphy sign. Common bile duct: Diameter: 5 mm Liver: No focal lesion identified. Within normal limits in parenchymal echogenicity. Portal vein is patent on color Doppler imaging with normal direction of blood flow towards the liver. IMPRESSION: Small gallstone.  No evidence for gallbladder inflammation. No biliary dilatation. Electronically Signed   By: Richarda Overlie M.D.   On: 09/19/2017 13:53   Dg Chest Port 1 View  Result Date: 09/18/2017 CLINICAL DATA:  Hypoxia.  Chronic chest pain. EXAM: PORTABLE CHEST 1 VIEW COMPARISON:  12/20/2016 and prior radiographs FINDINGS: The cardiomediastinal silhouette is unremarkable. There is no evidence of focal airspace disease,  pulmonary edema, suspicious pulmonary nodule/mass, pleural effusion, or pneumothorax. No acute bony abnormalities are identified. IMPRESSION: No active disease. Electronically Signed   By: Harmon Pier M.D.   On: 09/18/2017 14:36   Dg Toe Great Right  Result Date: 09/16/2017 CLINICAL DATA:  Diabetes mellitus with soft tissue ulceration EXAM: RIGHT FIRST TOE:  3 VIEWS COMPARISON:  Right foot October 14, 2011 FINDINGS: Frontal, oblique, and lateral views were obtained. There is soft tissue swelling of the first digit diffusely. There is lucency along the volar aspect at the level of the medial aspect of the first IP joint measuring approximately 1.6 x 1.4 cm. There is minimal calcification within this lucent area. There is no fracture or dislocation. No erosive change or bony destruction. Joint spaces appear unremarkable. IMPRESSION: Soft tissue ulceration at the level of the first IP joint. A small amount of calcification is noted in this area of ulceration, suggesting chronicity. Diffuse soft tissue swelling first digit. No bony destruction is appreciable by radiography. No fracture or dislocation. No  appreciable joint space narrowing or erosion. Electronically Signed   By: Lowella Grip III M.D.   On: 09/16/2017 13:10   Xr Foot Complete Right  Result Date: 09/09/2017 No definitive evidence of osteomyelitis. Soft tissue ulcer plantar to the great toe     Subjective: Seen this morning. Spouse at bedside. Tolerating diet without vomiting. Had a normal BM since last night. Denies abdominal pain. Has chronic intermittent nausea. Pain in lower extremities controlled. Confirmed home dose of insulin's. Indicates that his glucometer at home is not functioning.  Discharge Exam:  Vitals:   09/22/17 1300 09/22/17 1518 09/23/17 0456 09/23/17 1135  BP: 117/82 124/78 116/60 124/87  Pulse: 96  94 99  Resp: _0 Temp: 98.1 F (36.7 C) 98 F (36.7 C) 98 F (36.7 C)   TempSrc: Oral Oral Oral    SpO2: 97% 97% 97% 99%  Weight:      Height:        General exam: Pleasant young male, moderately built and obese, sitting up comfortably in bed.  Respiratory system: Clear to auscultation. Respiratory effort normal.  Cardiovascular system: S1 & S2 heard, RRR. No JVD, murmurs, rubs, gallops or clicks. No pedal edema.  Gastrointestinal system: Abdomen is nondistended, soft and nontender. No organomegaly or masses felt. Normal bowel sounds heard. Central nervous system: Alert and oriented. No focal neurological deficits. Extremities: Symmetric 5 x 5 power. Skin: Right foot dressing clean and dry.  Psychiatry: Judgement and insight appear normal. Mood & affect pleasant and appropriate.   The results of significant diagnostics from this hospitalization (including imaging, microbiology, ancillary and laboratory) are listed below for reference.     Microbiology: Recent Results (from the past 240 hour(s))  Surgical pcr screen     Status: None   Collection Time: 09/20/17 11:30 PM  Result Value Ref Range Status   MRSA, PCR NEGATIVE NEGATIVE Final   Staphylococcus aureus NEGATIVE NEGATIVE Final    Comment: (NOTE) The Xpert SA Assay (FDA approved for NASAL specimens in patients 53 years of age and older), is one component of a comprehensive surveillance program. It is not intended to diagnose infection nor to guide or monitor treatment.      Labs: CBC:  Recent Labs Lab 09/17/17 0618 09/18/17 0508 09/20/17 0434 09/22/17 0456  WBC 14.7* 16.0* 12.5* 11.9*  HGB 12.6* 12.3* 11.1* 11.1*  HCT 38.0* 37.7* 34.1* 33.6*  MCV 85.0 84.9 85.3 86.2  PLT 321 280 323 615   Basic Metabolic Panel:  Recent Labs Lab 09/17/17 0618 09/18/17 0508 09/20/17 0434 09/21/17 0527 09/22/17 0456  NA 136 134* 136 136 138  K 3.8 4.2 4.4 4.3 4.3  CL 105 102 104 101 106  CO2 _1 GLUCOSE 114* 233* 231* 199* 283*  BUN _2 CREATININE 1.10 0.94 0.93 0.99 1.03  CALCIUM 8.7* 8.5*  9.0 9.3 8.7*   Liver Function Tests:  Recent Labs Lab 09/17/17 0618  AST 14*  ALT 12*  ALKPHOS 61  BILITOT 0.5  PROT 6.4*  ALBUMIN 3.1*   Cardiac Enzymes:  Recent Labs Lab 09/18/17 1453  TROPONINI <0.03   CBG:  Recent Labs Lab 09/22/17 1113 09/22/17 1633 09/22/17 2116 09/23/17 0626 09/23/17 1129  GLUCAP 208* 262* 373* 250* 296*   Discussed in detail with patient's spouse at bedside. Updated care and answered questions.    Time coordinating discharge: Over 30 minutes  SIGNED:  Vernell Leep, MD, FACP, [place]. [facility]  Pager [phone]  If 7PM-7AM, please contact night-coverage [email or web address] Password [email or web address] 09/23/2017, 1:57 PM

## 2017-09-23 NOTE — Evaluation (Signed)
Physical Therapy Evaluation Patient Details Name: Joseph [name] MRN: [id number] DOB: 07-Aug-1971 Today's Date: 09/23/2017   History of Present Illness  Patient is a 46 y/o male who presents with right foot infection, found to osteomyelitis. Now s/p right great toe amputation. PMH includes CAD, DM, PE, tobacco abuse, HTN, HLD.  Clinical Impression  Patient presents with weakness and impaired mobility s/p above surgery. Tolerated gait training with Min guard assist due to first time pt being up. Gait training performed with pt in Darco shoe and cues for WB thru heel. Lengthy discussion with pt about home setup and need for DME due to balance deficits. Reports wanting crutches for support as a RW will not fit through his doors and he has used crutches before. Pt has support from wife. Will follow acutely to maximize independence and mobility prior to return home.     Follow Up Recommendations No PT follow up;Supervision - Intermittent    Equipment Recommendations  Crutches    Recommendations for Other Services       Precautions / Restrictions Precautions Precautions: Fall Precaution Comments: Hx of vertigo Required Braces or Orthoses: Other Brace/Splint Other Brace/Splint: Darco shoe Restrictions Weight Bearing Restrictions: Yes RLE Weight Bearing: Weight bearing as tolerated (Wb thru heel)      Mobility  Bed Mobility               General bed mobility comments: Sitting in w/c upon PT arrival.  Transfers Overall transfer level: Needs assistance Equipment used: None Transfers: Sit to/from Stand Sit to Stand: Supervision         General transfer comment: Supervision for safety. Effort to stand from w/c.  Ambulation/Gait Ambulation/Gait assistance: Min guard Ambulation Distance (Feet): 75 Feet Assistive device: None Gait Pattern/deviations: Step-through pattern Gait velocity: decreased Gait velocity interpretation: Below normal speed for age/gender General Gait  Details: Slow, mildly unsteady gait. No dizziness. Cues to WB thru heel. Reaching for rail at times for support.   Stairs            Wheelchair Mobility    Modified Rankin (Stroke Patients Only)       Balance Overall balance assessment: Needs assistance Sitting-balance support: Feet supported;No upper extremity supported Sitting balance-Leahy Scale: Good     Standing balance support: During functional activity Standing balance-Leahy Scale: Fair                               Pertinent Vitals/Pain Pain Assessment: No/denies pain    Home Living Family/patient expects to be discharged to:: Private residence Living Arrangements: Spouse/significant other;Children Available Help at Discharge: Family;Available PRN/intermittently Type of Home: Mobile home Home Access: Stairs to enter Entrance Stairs-Rails: Right Entrance Stairs-Number of Steps: 4 Home Layout: One level Home Equipment: Shower seat      Prior Function Level of Independence: Independent         Comments: Likes to work in the yard.      Hand Dominance        Extremity/Trunk Assessment   Upper Extremity Assessment Upper Extremity Assessment: Defer to OT evaluation    Lower Extremity Assessment Lower Extremity Assessment: Generalized weakness       Communication   Communication: No difficulties  Cognition Arousal/Alertness: Awake/alert Behavior During Therapy: WFL for tasks assessed/performed Overall Cognitive Status: Within Functional Limits for tasks assessed  General Comments General comments (skin integrity, edema, etc.): Wife present during session.    Exercises     Assessment/Plan    PT Assessment Patient needs continued PT services  PT Problem List Decreased strength;Decreased mobility;Pain;Decreased balance;Decreased activity tolerance       PT Treatment Interventions Therapeutic activities;Gait  training;Therapeutic exercise;Patient/family education;Balance training;Functional mobility training;Stair training    PT Goals (Current goals can be found in the Care Plan section)  Acute Rehab PT Goals Patient Stated Goal: to go home PT Goal Formulation: With patient Time For Goal Achievement: 10/07/17 Potential to Achieve Goals: Good    Frequency Min 3X/week   Barriers to discharge Inaccessible home environment stairs to enter home    Co-evaluation               AM-PAC PT "6 Clicks" Daily Activity  Outcome Measure Difficulty turning over in bed (including adjusting bedclothes, sheets and blankets)?: None Difficulty moving from lying on back to sitting on the side of the bed? : None Difficulty sitting down on and standing up from a chair with arms (e.g., wheelchair, bedside commode, etc,.)?: None Help needed moving to and from a bed to chair (including a wheelchair)?: None Help needed walking in hospital room?: A Little Help needed climbing 3-5 steps with a railing? : A Little 6 Click Score: 22    End of Session Equipment Utilized During Treatment: Gait belt Activity Tolerance: Patient tolerated treatment well Patient left: in bed;with call bell/phone within reach;with family/visitor present (sitting EOB.) Nurse Communication: Mobility status PT Visit Diagnosis: Unsteadiness on feet (R26.81);Muscle weakness (generalized) (M62.81)    Time: 4098-1191 PT Time Calculation (min) (ACUTE ONLY): 21 min   Charges:   PT Evaluation $PT Eval Low Complexity: 1 Low     PT G CodesMylo Red, PT, DPT [phone]    Blake Divine A Akil Hoos 09/23/2017, 2:58 PM

## 2017-09-23 NOTE — Telephone Encounter (Signed)
-----   Message from Benancio Deeds, MD sent at 09/23/2017 11:03 AM EDT ----- Yes that's fine thanks, Apparently he was scheduled to see me yesterday and got hospitalized.   ----- Message ----- From: Leverne Humbles, RN Sent: 09/23/2017   9:59 AM To: Benancio Deeds, MD  Patient was seen by Dr. Russella Dar at hospital for gastroparesis, wants him to follow up with you in one month. Where would you like me to schedule him?

## 2017-09-23 NOTE — Progress Notes (Signed)
Removed IV, provided discharge education/instruction, all questions and concerns addressed, Pt not in distress. Discharged home accompanied by wife and father-in-law.

## 2017-09-23 NOTE — Telephone Encounter (Signed)
Spoke to patient let him know that we have scheduled him a follow up from the hospital with Dr. Adela Lank on 10/25/17, no follow up slot was available, appointment scheduled in hemorrhoid banding slot.

## 2017-09-23 NOTE — Progress Notes (Signed)
PT Cancellation Note  Patient Details Name: AZEEZ DUNKER MRN: [id number] DOB: 02/25/1971   Cancelled Treatment:    Reason Eval/Treat Not Completed: Other (comment) (pt without Darco shoe present and await it for eval)   Bryella Diviney B Ainara Eldridge 09/23/2017, 11:23 AM  Delaney Meigs, PT [phone]

## 2017-09-25 ENCOUNTER — Other Ambulatory Visit: Payer: Medicare Other

## 2017-09-27 ENCOUNTER — Telehealth (INDEPENDENT_AMBULATORY_CARE_PROVIDER_SITE_OTHER): Payer: Self-pay | Admitting: Orthopaedic Surgery

## 2017-09-27 NOTE — Telephone Encounter (Signed)
Patient called advised his bandage has soaked thru. The number to contact patient is [phone]

## 2017-09-28 NOTE — Telephone Encounter (Signed)
Patient returned call and he is coming in tomorrow

## 2017-09-28 NOTE — Telephone Encounter (Signed)
I tried calling patient to discuss. No answer. LMVM for him advising that office was returning his call about soaked bandage.

## 2017-09-29 ENCOUNTER — Ambulatory Visit (INDEPENDENT_AMBULATORY_CARE_PROVIDER_SITE_OTHER): Payer: Medicare Other | Admitting: Orthopaedic Surgery

## 2017-09-29 DIAGNOSIS — L97519 Non-pressure chronic ulcer of other part of right foot with unspecified severity: Secondary | ICD-10-CM

## 2017-09-29 NOTE — Progress Notes (Signed)
Patient is one-week status post right great toe amputation. He is doing well overall. He does not have any significant pain. He does have mild serous drainage from the incision. There is no cellulitis or evidence of continued infection.  The incision is clean dry and intact. The sutures are intact. There is some mild serous drainage from the incision without any frank pus.  From my standpoint can continue to heal weight-bear only if necessary. Continue doxycycline. Follow-up in 2 weeks for wound check and suture removal

## 2017-10-07 ENCOUNTER — Inpatient Hospital Stay (INDEPENDENT_AMBULATORY_CARE_PROVIDER_SITE_OTHER): Payer: Medicare Other | Admitting: Orthopaedic Surgery

## 2017-10-12 DIAGNOSIS — J329 Chronic sinusitis, unspecified: Secondary | ICD-10-CM | POA: Insufficient documentation

## 2017-10-12 DIAGNOSIS — S91109A Unspecified open wound of unspecified toe(s) without damage to nail, initial encounter: Secondary | ICD-10-CM | POA: Insufficient documentation

## 2017-10-21 ENCOUNTER — Ambulatory Visit (INDEPENDENT_AMBULATORY_CARE_PROVIDER_SITE_OTHER): Payer: Medicare Other | Admitting: Orthopaedic Surgery

## 2017-10-21 ENCOUNTER — Encounter (INDEPENDENT_AMBULATORY_CARE_PROVIDER_SITE_OTHER): Payer: Self-pay | Admitting: Orthopaedic Surgery

## 2017-10-21 DIAGNOSIS — L97519 Non-pressure chronic ulcer of other part of right foot with unspecified severity: Secondary | ICD-10-CM

## 2017-10-21 NOTE — Progress Notes (Signed)
Aurther Lofterry is 4 weeks status post great toe amputation.  He is overall doing well.  He denies any pain or any real complaints.  Sutures are intact and the surgical incision is healed without any signs of infection.  Sutures removed today.  He may begin to wearing regular shoes and to weight-bear on his forefoot.  I will see him back in 4 weeks for recheck and likely begin prosthetic fitting

## 2017-10-25 ENCOUNTER — Ambulatory Visit: Payer: Medicare Other | Admitting: Gastroenterology

## 2017-11-21 ENCOUNTER — Ambulatory Visit (INDEPENDENT_AMBULATORY_CARE_PROVIDER_SITE_OTHER): Payer: Medicare Other | Admitting: Orthopaedic Surgery

## 2017-11-24 ENCOUNTER — Ambulatory Visit (INDEPENDENT_AMBULATORY_CARE_PROVIDER_SITE_OTHER): Payer: Medicare Other | Admitting: Orthopaedic Surgery

## 2017-11-24 ENCOUNTER — Encounter (INDEPENDENT_AMBULATORY_CARE_PROVIDER_SITE_OTHER): Payer: Self-pay | Admitting: Orthopaedic Surgery

## 2017-11-24 DIAGNOSIS — L97519 Non-pressure chronic ulcer of other part of right foot with unspecified severity: Secondary | ICD-10-CM

## 2017-11-24 MED ORDER — ONDANSETRON HCL 4 MG PO TABS: 4.0000 mg | ORAL_TABLET | Freq: Three times a day (TID) | ORAL | 0 refills | Status: DC | PRN

## 2017-11-24 NOTE — Progress Notes (Signed)
Patient is 8 weeks status post right great toe amputation.  He is doing well.Joseph [name].  He does endorse weakness with pushoff.  Overall he is doing well.  The surgical scar is fully healed.  At this point we will send him to [facility] for prosthetic fitting.  Questions encouraged and answered.  Follow-up as needed.

## 2017-12-23 ENCOUNTER — Ambulatory Visit (INDEPENDENT_AMBULATORY_CARE_PROVIDER_SITE_OTHER): Payer: Medicare Other | Admitting: Orthopaedic Surgery

## 2018-03-21 ENCOUNTER — Other Ambulatory Visit: Payer: Self-pay | Admitting: *Deleted

## 2018-03-21 MED ORDER — CLOPIDOGREL BISULFATE 75 MG PO TABS: 75.0000 mg | ORAL_TABLET | Freq: Every day | ORAL | 0 refills | Status: DC

## 2018-03-26 DIAGNOSIS — F32A Depression, unspecified: Secondary | ICD-10-CM | POA: Insufficient documentation

## 2018-03-26 DIAGNOSIS — F419 Anxiety disorder, unspecified: Secondary | ICD-10-CM | POA: Insufficient documentation

## 2018-07-13 ENCOUNTER — Emergency Department (HOSPITAL_COMMUNITY): Payer: Medicare Other

## 2018-07-13 ENCOUNTER — Emergency Department (HOSPITAL_COMMUNITY)
Admission: EM | Admit: 2018-07-13 | Discharge: 2018-07-13 | Disposition: A | Discharge status: Home / Self Care | Payer: Medicare Other | Attending: Emergency Medicine | Admitting: Emergency Medicine

## 2018-07-13 ENCOUNTER — Encounter (HOSPITAL_COMMUNITY): Payer: Self-pay

## 2018-07-13 DIAGNOSIS — Z955 Presence of coronary angioplasty implant and graft: Secondary | ICD-10-CM | POA: Diagnosis not present

## 2018-07-13 DIAGNOSIS — I10 Essential (primary) hypertension: Secondary | ICD-10-CM | POA: Insufficient documentation

## 2018-07-13 DIAGNOSIS — F17210 Nicotine dependence, cigarettes, uncomplicated: Secondary | ICD-10-CM | POA: Insufficient documentation

## 2018-07-13 DIAGNOSIS — Y999 Unspecified external cause status: Secondary | ICD-10-CM | POA: Insufficient documentation

## 2018-07-13 DIAGNOSIS — R200 Anesthesia of skin: Secondary | ICD-10-CM | POA: Insufficient documentation

## 2018-07-13 DIAGNOSIS — Z7982 Long term (current) use of aspirin: Secondary | ICD-10-CM | POA: Insufficient documentation

## 2018-07-13 DIAGNOSIS — Z79899 Other long term (current) drug therapy: Secondary | ICD-10-CM | POA: Diagnosis not present

## 2018-07-13 DIAGNOSIS — I2510 Atherosclerotic heart disease of native coronary artery without angina pectoris: Secondary | ICD-10-CM | POA: Diagnosis not present

## 2018-07-13 DIAGNOSIS — J449 Chronic obstructive pulmonary disease, unspecified: Secondary | ICD-10-CM | POA: Diagnosis not present

## 2018-07-13 DIAGNOSIS — W228XXA Striking against or struck by other objects, initial encounter: Secondary | ICD-10-CM | POA: Diagnosis not present

## 2018-07-13 DIAGNOSIS — Z7901 Long term (current) use of anticoagulants: Secondary | ICD-10-CM | POA: Insufficient documentation

## 2018-07-13 DIAGNOSIS — Z794 Long term (current) use of insulin: Secondary | ICD-10-CM | POA: Insufficient documentation

## 2018-07-13 DIAGNOSIS — Y929 Unspecified place or not applicable: Secondary | ICD-10-CM | POA: Insufficient documentation

## 2018-07-13 DIAGNOSIS — M79644 Pain in right finger(s): Secondary | ICD-10-CM | POA: Insufficient documentation

## 2018-07-13 DIAGNOSIS — E1140 Type 2 diabetes mellitus with diabetic neuropathy, unspecified: Secondary | ICD-10-CM | POA: Insufficient documentation

## 2018-07-13 DIAGNOSIS — S60211A Contusion of right wrist, initial encounter: Secondary | ICD-10-CM | POA: Insufficient documentation

## 2018-07-13 DIAGNOSIS — S6991XA Unspecified injury of right wrist, hand and finger(s), initial encounter: Secondary | ICD-10-CM | POA: Diagnosis present

## 2018-07-13 DIAGNOSIS — Y9389 Activity, other specified: Secondary | ICD-10-CM | POA: Diagnosis not present

## 2018-07-13 DIAGNOSIS — Z7902 Long term (current) use of antithrombotics/antiplatelets: Secondary | ICD-10-CM | POA: Insufficient documentation

## 2018-07-13 DIAGNOSIS — E119 Type 2 diabetes mellitus without complications: Secondary | ICD-10-CM | POA: Diagnosis not present

## 2018-07-13 MED ORDER — IBUPROFEN 400 MG PO TABS
600.0000 mg | ORAL_TABLET | Freq: Once | ORAL | Status: AC
  Administered 2018-07-13: 600 mg via ORAL
  Filled 2018-07-13: qty 2
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
## 2018-07-13 NOTE — Discharge Instructions (Signed)
Elevate your hand. Use ice packs to the painful areas, be careful with your neuropathy that you do not get frost bite, put a towel between your skin and the ice pack. Take ibuprofen 600 mg + acetaminophen 650 mg 4 times a day for pain. Wear the ace wrap for comfort.  Recheck with Dr. Romeo AppleHarrison if your arm is still painful after a week.

## 2018-09-25 ENCOUNTER — Other Ambulatory Visit: Payer: Self-pay

## 2018-09-25 ENCOUNTER — Emergency Department (HOSPITAL_COMMUNITY): Payer: Medicare Other

## 2018-09-25 ENCOUNTER — Encounter (HOSPITAL_COMMUNITY): Payer: Self-pay | Admitting: Emergency Medicine

## 2018-09-25 ENCOUNTER — Emergency Department (HOSPITAL_COMMUNITY)
Admission: EM | Admit: 2018-09-25 | Discharge: 2018-09-26 | Disposition: A | Discharge status: Home / Self Care | Payer: Medicare Other | Attending: Emergency Medicine | Admitting: Emergency Medicine

## 2018-09-25 DIAGNOSIS — Z955 Presence of coronary angioplasty implant and graft: Secondary | ICD-10-CM | POA: Insufficient documentation

## 2018-09-25 DIAGNOSIS — M19012 Primary osteoarthritis, left shoulder: Secondary | ICD-10-CM | POA: Diagnosis not present

## 2018-09-25 DIAGNOSIS — Z7902 Long term (current) use of antithrombotics/antiplatelets: Secondary | ICD-10-CM | POA: Diagnosis not present

## 2018-09-25 DIAGNOSIS — I10 Essential (primary) hypertension: Secondary | ICD-10-CM | POA: Diagnosis not present

## 2018-09-25 DIAGNOSIS — Z79899 Other long term (current) drug therapy: Secondary | ICD-10-CM | POA: Insufficient documentation

## 2018-09-25 DIAGNOSIS — Z7982 Long term (current) use of aspirin: Secondary | ICD-10-CM | POA: Diagnosis not present

## 2018-09-25 DIAGNOSIS — Z794 Long term (current) use of insulin: Secondary | ICD-10-CM | POA: Diagnosis not present

## 2018-09-25 DIAGNOSIS — F17210 Nicotine dependence, cigarettes, uncomplicated: Secondary | ICD-10-CM | POA: Insufficient documentation

## 2018-09-25 DIAGNOSIS — E119 Type 2 diabetes mellitus without complications: Secondary | ICD-10-CM | POA: Diagnosis not present

## 2018-09-25 DIAGNOSIS — I2510 Atherosclerotic heart disease of native coronary artery without angina pectoris: Secondary | ICD-10-CM | POA: Diagnosis not present

## 2018-09-25 DIAGNOSIS — Z7901 Long term (current) use of anticoagulants: Secondary | ICD-10-CM | POA: Diagnosis not present

## 2018-09-25 DIAGNOSIS — M25512 Pain in left shoulder: Secondary | ICD-10-CM | POA: Diagnosis present

## 2018-09-25 DIAGNOSIS — J45909 Unspecified asthma, uncomplicated: Secondary | ICD-10-CM | POA: Insufficient documentation

## 2018-09-25 MED ORDER — CYCLOBENZAPRINE HCL 10 MG PO TABS
10.0000 mg | ORAL_TABLET | Freq: Once | ORAL | Status: AC
  Administered 2018-09-25: 10 mg via ORAL
  Filled 2018-09-25: qty 1

## 2018-09-25 NOTE — ED Triage Notes (Signed)
Pt C/O left shoulder pain that has "been going on for years." Pt states he fell last week but does not think "that has anything to do with it." Pt denies any other injury.

## 2018-09-25 NOTE — ED Provider Notes (Signed)
[facility] EMERGENCY DEPARTMENT Provider Note   CSN: [id number] Arrival date & time: 09/25/18  2239  Time seen 23:05 PM   History   Chief Complaint Chief Complaint  Patient presents with  . Shoulder Pain    HPI Joseph [name] is a 47 y.o. male.  HPI patient states he has had pain in his left shoulder "for years".  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  He states it seems to be getting worse over the past month and this evening he states the pain has gotten worse since about 830 where he has no position of comfort.  He states "even breathing makes it worse".  He states nothing makes it feel better.  Patient is chronically on OxyContin 15 mg without relief.  He states he has been seen at [facility] per orthopedics which is now at [facility] by Dr. Alvan Dame for arthritis in his knees.  He was seen earlier today by his PCP for his regular 24-monthcheckup.  He states he has chronic numbness in his hands which is not worse in his left hand.  Patient is on Xarelto because he states he has stents and he has had blood clots.  PCP MEverardo Beals NP   Past Medical History:  Diagnosis Date  . Anxiety   . Arthritis    "knees, shoulders, hips, ankles" (07/29/2016)  . Asthma   . CAD (coronary artery disease)   . Chronic bronchitis ([place]   . Chronic ulcer of right great toe (HD[place] 09/19/2017  . COPD (chronic obstructive pulmonary disease) ([place]   . Depression   . DVT (deep venous thrombosis) (HQueen Anne's   . GERD (gastroesophageal reflux disease)   . Headache    "about 3 times/month" (07/29/2016)  . Hyperlipidemia   . Hypertension   . Myocardial infarction ([place] 1996   "light one"  . PE (pulmonary embolism) 04/2013  . Peripheral nerve disease   . Pneumonia "several times"  . Type II diabetes mellitus ([facility]     Patient Active Problem List   Diagnosis Date Noted  . Abdominal pain   . Loss of weight   . Diabetic foot infection ([place]  09/16/2017  . Uncontrolled type 2 diabetes mellitus with hyperglycemia, with long-term current use of insulin ([place] 09/16/2017  . Cellulitis of right foot   . Chronic ulcer of great toe of right foot (H[place] 09/09/2017  . CAD (coronary artery disease)   . Chest pain, moderate coronary artery risk 07/29/2016  . Unstable angina (H[place] 07/29/2016  . Hyperglycemia   . Type II diabetes mellitus, uncontrolled (HB and E 04/29/2015  . OSA (obstructive sleep apnea) 05/08/2013  . Acute pulmonary embolism (H[place] 04/27/2013  . CHEST PAIN-UNSPECIFIED 11/23/2010  . Coronary atherosclerosis 09/10/2008  . Type 1 diabetes mellitus (HGriff[place] 09/09/2008  . HYPERLIPIDEMIA 09/09/2008  . Essential hypertension 09/09/2008    Past Surgical History:  Procedure Laterality Date  . AMPUTATION Right 09/21/2017   Procedure: RIGHT GREAT TOE AMPUTATION, POSSIBLE VAC;  Surgeon: XLeandrew Koyanagi MD;  [place]  Service: Orthopedics;  Laterality: Right;  . CARDIAC CATHETERIZATION  2006  . CARDIAC CATHETERIZATION  1996   "@ [facility]; when I had my heart attack"  . CARDIAC CATHETERIZATION N/A 07/29/2016   Procedure: Left Heart Cath and Coronary Angiography;  Surgeon: JLorretta Harp MD;  [place];  Service: Cardiovascular;  Laterality: N/A;  . CARDIAC CATHETERIZATION N/A 07/29/2016   Procedure: Coronary Stent  Intervention;  Surgeon: Lorretta Harp, MD;  Location: [place] CV LAB;  Service: Cardiovascular;  Laterality: N/A;  . CARPAL TUNNEL RELEASE Bilateral   . CORONARY ANGIOPLASTY WITH STENT PLACEMENT  07/29/2016  . ESOPHAGOGASTRODUODENOSCOPY N/A 09/22/2017   Procedure: ESOPHAGOGASTRODUODENOSCOPY (EGD);  Surgeon: Ladene Artist, MD;  Location: [facility] ENDOSCOPY;  Service: Endoscopy;  Laterality: N/A;  . KNEE ARTHROSCOPY Bilateral    "2 on left; 1 on the right"  . SHOULDER OPEN ROTATOR CUFF REPAIR Bilateral         Home Medications    Prior to Admission medications   Medication Sig Start Date End  Date Taking? Authorizing Provider  albuterol (PROAIR HFA) 108 (90 BASE) MCG/ACT inhaler Inhale 2 puffs into the lungs every 6 (six) hours as needed for wheezing or shortness of breath.     [provider]  albuterol (PROVENTIL) (2.5 MG/3ML) 0.083% nebulizer solution Take 2.5 mg by nebulization every 4 (four) hours as needed for wheezing or shortness of breath.    [provider]  ALPRAZolam Duanne Moron) 1 MG tablet Take 1-2 mg by mouth 4 (four) times daily. 2 mg every evening    [provider]  amLODipine (NORVASC) 2.5 MG tablet Take 2.5 mg by mouth every morning.     [provider]  amoxicillin-clavulanate (AUGMENTIN) 875-125 MG tablet amoxicillin 875 mg-potassium clavulanate 125 mg tablet    [provider]  aspirin 81 MG chewable tablet Chew 1 tablet (81 mg total) by mouth daily. 07/30/16   Bhagat, Crista Luria, PA  atorvastatin (LIPITOR) 40 MG tablet Take 1 tablet (40 mg total) by mouth daily. Patient taking differently: Take 40 mg by mouth daily at 6 PM.  07/29/15   Skeet Latch, MD  blood glucose meter kit and supplies KIT Dispense based on patient and insurance preference. Use up to four times daily as directed. (FOR ICD-9 250.00, 250.01). 09/23/17   Hongalgi, Lenis Dickinson, MD  buPROPion (WELLBUTRIN SR) 150 MG 12 hr tablet Take 1 tablet by mouth 2 (two) times daily. 08/17/16   [provider]  clopidogrel (PLAVIX) 75 MG tablet Take 1 tablet (75 mg total) by mouth daily with breakfast. 03/21/18   Skeet Latch, MD  doxycycline (VIBRA-TABS) 100 MG tablet Take 1 tablet (100 mg total) by mouth 2 (two) times daily. Patient not taking: Reported on 11/24/2017 09/23/17   Modena Jansky, MD  fenofibrate (TRICOR) 145 MG tablet Take 145 mg by mouth every morning.     [provider]  HUMALOG KWIKPEN 100 UNIT/ML SOPN Inject 35-40 Units into the skin 3 (three) times daily with meals. As directed per sliding scale 05/02/13   [provider]    LANTUS SOLOSTAR 100 UNIT/ML Solostar Pen Inject 70 Units into the skin 2 (two) times daily.  12/01/14   [provider]  LYRICA 50 MG capsule Take 50-100 mg by mouth See admin instructions. 100 mg every morning and 50 mg every evening 02/16/15   [provider]  meclizine (ANTIVERT) 25 MG tablet Take 25 mg by mouth 3 (three) times daily as needed for dizziness. 25 mg every morning and 25 mg every evening.  May take an additional 25 mg during the day 04/10/13   [provider]  metFORMIN (GLUCOPHAGE) 1000 MG tablet Take 1,000 mg by mouth 2 (two) times daily with a meal.      [provider]  metoCLOPramide (REGLAN) 5 MG tablet Take 1 tablet (5 mg total) by mouth 4 (four) times daily -  before meals and at bedtime. 09/23/17   Hongalgi, Lenis Dickinson, MD  nitroGLYCERIN (NITROSTAT) 0.4 MG SL tablet PLACE 1 TABLET UNDER THE TONGUE EVERY 5 MINUTES AS NEEDED FOR CHEST PAIN 09/20/17   Skeet Latch, MD  ondansetron (ZOFRAN) 4 MG tablet Take 1-2 tablets (4-8 mg total) by mouth every 8 (eight) hours as needed for nausea or vomiting. 11/24/17   Leandrew Koyanagi, MD  ondansetron (ZOFRAN) 4 MG tablet Take 1-2 tablets (4-8 mg total) by mouth every 8 (eight) hours as needed for nausea or vomiting. 11/24/17   Leandrew Koyanagi, MD  oxyCODONE (ROXICODONE) 15 MG immediate release tablet Take 15 mg by mouth 5 (five) times daily.     [provider]  pantoprazole (PROTONIX) 40 MG tablet Take 1 tablet (40 mg total) by mouth daily. 09/23/17   Hongalgi, Lenis Dickinson, MD  polyethylene glycol (MIRALAX / GLYCOLAX) packet Take 17 g by mouth daily. Patient not taking: Reported on 11/24/2017 09/24/17   Modena Jansky, MD  promethazine (PHENERGAN) 25 MG tablet Take 25 mg by mouth every 6 (six) hours as needed for nausea.     [provider]  Rivaroxaban (XARELTO) 20 MG TABS Take 1 tablet (20 mg total) by mouth daily. Patient taking differently: Take 20 mg by mouth daily. Take with food 05/28/13    Black, Lezlie Octave, NP    Family History Family History  Problem Relation Age of Onset  . Hypertension Brother   . Hypertension Father   . Diabetes Other   . Hyperlipidemia Other     Social History Social History   Tobacco Use  . Smoking status: Current Every Day Smoker    Packs/day: 1.00    Years: 34.00    Pack years: 34.00    Types: Cigarettes  . Smokeless tobacco: Former Systems developer    Types: Snuff, Chew  . Tobacco comment: 8/910/2017 "3 ppd before 2015"  Substance Use Topics  . Alcohol use: Yes    Comment: 07/29/2016 "quit drinking in 2004"  . Drug use: No  lives with spouse   Allergies   Sulfa antibiotics   Review of Systems Review of Systems  All other systems reviewed and are negative.    Physical Exam Updated Vital Signs BP 117/85 (BP Location: Right Arm)   Pulse 97   Temp 98 F (36.7 C) (Oral)   Resp 20   Ht 6' 5"  (1.956 m)   Wt (!) 143.8 kg   SpO2 100%   BMI 37.59 kg/m   Vital signs normal    Physical Exam  Constitutional: He is oriented to person, place, and time. He appears well-developed and well-nourished.  HENT:  Head: Normocephalic and atraumatic.  Right Ear: External ear normal.  Left Ear: External ear normal.  Nose: Nose normal.  Eyes: Conjunctivae and EOM are normal.  Neck: Normal range of motion.  Cardiovascular: Normal rate.  Pulmonary/Chest: Effort normal. No respiratory distress.  Musculoskeletal: He exhibits tenderness.  Patient is laying on the stretcher holding his left upper extremity against his body with his elbow flexed.  He did not attempt range of motion.  However when I asked him if the motion of abduction and demonstrated to him causes pain he states he has.  He is tender diffusely in the left shoulder joint without obvious effusion or redness of the skin.  He has good distal pulses.  Please note when he is lying down he has his right arm extended over his head.  Neurological: He is  alert and oriented to person, place, and  time. No cranial nerve deficit.  Skin: Skin is warm and dry. No erythema.  Psychiatric: He has a normal mood and affect. His behavior is normal. Thought content normal.  Nursing note and vitals reviewed.    ED Treatments / Results  Labs (all labs ordered are listed, but only abnormal results are displayed) Labs Reviewed - No data to display  EKG None  Radiology Dg Shoulder Left  Result Date: 09/25/2018 CLINICAL DATA:  Chronic left shoulder pain worse in the past few days. EXAM: LEFT SHOULDER - 2+ VIEW COMPARISON:  None. FINDINGS: Osteoarthritis of the [facility] and glenohumeral joints with joint space narrowing and spurring. On the axial view there is an ossific density projecting anterior to the [facility] joint likely representing mild degenerative change off the [facility] joint and less likely soft tissue calcification associated with the biceps tendon or rotator cuff. On the external rotation view there 3 soft tissue densities along the proximal humerus that may reflect calcific biceps tendinosis or soft tissue artifacts. No acute fracture or suspicious osseous lesions. The adjacent ribs and lung are nonacute. IMPRESSION: 1. Osteoarthritis of the [facility] and glenohumeral joints. 2. Three soft tissue ossifications are noted the proximal humerus question biceps tendinosis versus soft tissue artifacts. Electronically Signed   By: Ashley Royalty M.D.   On: 09/25/2018 23:44   Dg Humerus Left  Result Date: 09/25/2018 CLINICAL DATA:  Chronic left humerus pain worse over the past few days. EXAM: LEFT HUMERUS - 2+ VIEW COMPARISON:  None. FINDINGS: Degenerative joint space narrowing spurring of the [facility] and glenohumeral joints consistent with osteoarthritis. No fracture or suspicious osseous lesions. The elbow joint appears intact without effusion. IMPRESSION: Osteoarthritis of the AC and glenohumeral joints. No acute osseous appearing abnormality. Electronically Signed   By: Ashley Royalty M.D.   On: 09/25/2018 23:45     Procedures Procedures (including critical care time)  Medications Ordered in ED Medications  cyclobenzaprine (FLEXERIL) tablet 10 mg (10 mg Oral Given 09/25/18 2356)     Initial Impression / Assessment and Plan / ED Course  I have reviewed the triage vital signs and the nursing notes.  Pertinent labs & imaging results that were available during my care of the patient were reviewed by me and considered in my medical decision making (see chart for details).     X-ray was ordered of his shoulder and humerus.  At the time of my exam he had not gone to radiology yet.  I have explained to him that I cannot give him anything stronger for pain, he is already on oxycodone 15 mg tablets.  He is also on Xarelto so he will be limited and not be able to stay on nonsteroidal anti-inflammatory drugs.  I can add a muscle relaxer for now.  I have reviewed his x-ray and given the results the patient.  He was advised to try topical Capsaicin  products over-the-counter for pain relief.  He will need to discuss his pain medication regimen with his primary care doctor.  He can follow-up with his orthopedic office for further evaluation of his shoulder pain.   Review of the [place] shows patient gets #150 oxycodone 15 mg tablets monthly, last filled September eighth, #120 alprazolam 1 mg tablets last filled August 30, #90 Lyrica last filled September 11 all by his PCP.  His overdose risk score is 730 despite all of his prescriptions being written by his primary care doctor.  Final  Clinical Impressions(s) / ED Diagnoses   Final diagnoses:  Osteoarthritis of left shoulder, unspecified osteoarthritis type    ED Discharge Orders    None    OTC Capsaicin topical  Plan discharge  [name], MD, Barbette Or, MD 09/26/18 [phone]

## 2018-09-26 NOTE — Discharge Instructions (Addendum)
Try using an OTC Capsaicin topical product for your pain, like Capzasin-HP. Apply it at the same time every day.  DO NOT GET ON YOUR FACE OR EYES!!! You will need to discuss your pain medication with your primary care doctor. Consider getting an appointment at [facility] now [facility] to discuss your shoulder pain.

## 2018-10-04 ENCOUNTER — Other Ambulatory Visit: Payer: Self-pay | Admitting: Cardiovascular Disease

## 2018-12-21 ENCOUNTER — Other Ambulatory Visit: Payer: Self-pay | Admitting: Orthopedic Surgery

## 2018-12-21 DIAGNOSIS — M75102 Unspecified rotator cuff tear or rupture of left shoulder, not specified as traumatic: Secondary | ICD-10-CM

## 2018-12-27 ENCOUNTER — Ambulatory Visit
Admission: RE | Admit: 2018-12-27 | Discharge: 2018-12-27 | Disposition: A | Discharge status: Home / Self Care | Payer: Medicare Other | Source: Ambulatory Visit | Attending: Orthopedic Surgery | Admitting: Orthopedic Surgery

## 2018-12-27 DIAGNOSIS — M75102 Unspecified rotator cuff tear or rupture of left shoulder, not specified as traumatic: Secondary | ICD-10-CM

## 2018-12-30 ENCOUNTER — Other Ambulatory Visit: Payer: Self-pay | Admitting: Cardiovascular Disease

## 2019-01-01 ENCOUNTER — Other Ambulatory Visit: Payer: Self-pay | Admitting: Cardiovascular Disease

## 2019-01-20 ENCOUNTER — Other Ambulatory Visit: Payer: Self-pay

## 2019-01-20 ENCOUNTER — Emergency Department (HOSPITAL_COMMUNITY)
Admission: EM | Admit: 2019-01-20 | Discharge: 2019-01-20 | Disposition: A | Discharge status: Home / Self Care | Payer: Medicare Other | Attending: Emergency Medicine | Admitting: Emergency Medicine

## 2019-01-20 ENCOUNTER — Emergency Department (HOSPITAL_COMMUNITY): Payer: Medicare Other

## 2019-01-20 ENCOUNTER — Encounter (HOSPITAL_COMMUNITY): Payer: Self-pay | Admitting: Emergency Medicine

## 2019-01-20 DIAGNOSIS — I2510 Atherosclerotic heart disease of native coronary artery without angina pectoris: Secondary | ICD-10-CM | POA: Diagnosis not present

## 2019-01-20 DIAGNOSIS — Y9289 Other specified places as the place of occurrence of the external cause: Secondary | ICD-10-CM | POA: Diagnosis not present

## 2019-01-20 DIAGNOSIS — Z79899 Other long term (current) drug therapy: Secondary | ICD-10-CM | POA: Insufficient documentation

## 2019-01-20 DIAGNOSIS — J449 Chronic obstructive pulmonary disease, unspecified: Secondary | ICD-10-CM | POA: Diagnosis not present

## 2019-01-20 DIAGNOSIS — Y9389 Activity, other specified: Secondary | ICD-10-CM | POA: Insufficient documentation

## 2019-01-20 DIAGNOSIS — S61201A Unspecified open wound of left index finger without damage to nail, initial encounter: Secondary | ICD-10-CM | POA: Diagnosis present

## 2019-01-20 DIAGNOSIS — H1033 Unspecified acute conjunctivitis, bilateral: Secondary | ICD-10-CM | POA: Diagnosis not present

## 2019-01-20 DIAGNOSIS — I10 Essential (primary) hypertension: Secondary | ICD-10-CM | POA: Diagnosis not present

## 2019-01-20 DIAGNOSIS — X58XXXA Exposure to other specified factors, initial encounter: Secondary | ICD-10-CM | POA: Diagnosis not present

## 2019-01-20 DIAGNOSIS — E119 Type 2 diabetes mellitus without complications: Secondary | ICD-10-CM | POA: Insufficient documentation

## 2019-01-20 DIAGNOSIS — F17210 Nicotine dependence, cigarettes, uncomplicated: Secondary | ICD-10-CM | POA: Diagnosis not present

## 2019-01-20 DIAGNOSIS — E785 Hyperlipidemia, unspecified: Secondary | ICD-10-CM | POA: Insufficient documentation

## 2019-01-20 DIAGNOSIS — Y998 Other external cause status: Secondary | ICD-10-CM | POA: Insufficient documentation

## 2019-01-20 DIAGNOSIS — S61209A Unspecified open wound of unspecified finger without damage to nail, initial encounter: Secondary | ICD-10-CM

## 2019-01-20 LAB — CBG MONITORING, ED: Glucose-Capillary: 194 mg/dL — ABNORMAL HIGH (ref 70–99)

## 2019-01-20 MED ORDER — ERYTHROMYCIN 5 MG/GM OP OINT: TOPICAL_OINTMENT | OPHTHALMIC | 0 refills | Status: DC

## 2019-01-20 MED ORDER — FLUORESCEIN SODIUM 1 MG OP STRP
1.0000 | ORAL_STRIP | Freq: Once | OPHTHALMIC | Status: AC
  Administered 2019-01-20: 1 via OPHTHALMIC
  Filled 2019-01-20: qty 1

## 2019-01-20 NOTE — ED Triage Notes (Addendum)
Pt c/o pain to bilateral outer corner of his eyes x 1 week. Reports when he blinks, "it feels like razor blades." States he just woke up and they were like that. Pt also c/o ulcer to LT index finger.

## 2019-01-20 NOTE — ED Provider Notes (Signed)
Emergency Department Provider Note   I have reviewed the triage vital signs and the nursing notes.   HISTORY  Chief Complaint Eye Problem   HPI Joseph [name] is a 48 y.o. male with PMH of CAD, COPD, HLD, HTN, DM, and Neuropathy presents to the emergency department with eye discomfort and chronic wound to the left index finger.  The patient woke up this morning with pain in both eyes.  He describes its near the lids and toward the outside on both.  No vision changes.  No pain with extraocular movements.  No fevers or chills.  He has noticed some mild discharge.  No injury to the eye or similar symptoms in the past. Patient does not wear contact lenses.  Patient also notes a chronic wound to the left, index finger.  The patient states that he frequently burns his hands because of decreased sensation.  He states that initially the finger began as a burn with a blister and has since developed this ulceration.  He states that it has had redness and drainage but that has stopped.  No new burn or injury to the finger.  He states he brings it up today because he is here for his eyes so thought he would get it checked.  He has not discussed this with his PCP but does plan to do so. No fever or chills.    Past Medical History:  Diagnosis Date  . Anxiety   . Arthritis    "knees, shoulders, hips, ankles" (07/29/2016)  . Asthma   . CAD (coronary artery disease)   . Chronic bronchitis (HCC)   . Chronic ulcer of right great toe (HCC) 09/19/2017  . COPD (chronic obstructive pulmonary disease) (HCC)   . Depression   . DVT (deep venous thrombosis) (HCC)   . GERD (gastroesophageal reflux disease)   . Headache    "about 3 times/month" (07/29/2016)  . Hyperlipidemia   . Hypertension   . Myocardial infarction (HCC) 1996   "light one"  . PE (pulmonary embolism) 04/2013  . Peripheral nerve disease   . Pneumonia "several times"  . Type II diabetes mellitus [facility](HCC)     Patient Active Problem List     Diagnosis Date Noted  . Abdominal pain   . Loss of weight   . Diabetic foot infection (HCC) 09/16/2017  . Uncontrolled type 2 diabetes mellitus with hyperglycemia, with Nazaiah Navarrete-term current use of insulin (HCC) 09/16/2017  . Cellulitis of right foot   . Chronic ulcer of great toe of right foot (HCC) 09/09/2017  . CAD (coronary artery disease)   . Chest pain, moderate coronary artery risk 07/29/2016  . Unstable angina (HCC) 07/29/2016  . Hyperglycemia   . Type II diabetes mellitus, uncontrolled (HCC) 04/29/2015  . OSA (obstructive sleep apnea) 05/08/2013  . Acute pulmonary embolism (HCC) 04/27/2013  . CHEST PAIN-UNSPECIFIED 11/23/2010  . Coronary atherosclerosis 09/10/2008  . Type 1 diabetes mellitus (HCC) 09/09/2008  . HYPERLIPIDEMIA 09/09/2008  . Essential hypertension 09/09/2008    Past Surgical History:  Procedure Laterality Date  . AMPUTATION Right 09/21/2017   Procedure: RIGHT GREAT TOE AMPUTATION, POSSIBLE VAC;  Surgeon: Tarry KosXu, Naiping M, MD;  Location: [facility] OR;  Service: Orthopedics;  Laterality: Right;  . CARDIAC CATHETERIZATION  2006  . CARDIAC CATHETERIZATION  1996   "@ [facility]; when I had my heart attack"  . CARDIAC CATHETERIZATION N/A 07/29/2016   Procedure: Left Heart Cath and Coronary Angiography;  Surgeon: Runell GessJonathan J Berry, MD;  [facility]  INV[facility] CV [facility];  Service: Cardiovascular;  Laterality: N/A;  . CARDIAC CATHETERIZATION N/A 07/29/2016   Procedure: Coronary Stent Intervention;  Surgeon: Runell Gess, MD;  Location: [facility] INVASIVE CV LAB;  Service: Cardiovascular;  Laterality: N/A;  . CARPAL TUNNEL RELEASE Bilateral   . CORONARY ANGIOPLASTY WITH STENT PLACEMENT  07/29/2016  . ESOPHAGOGASTRODUODENOSCOPY N/A 09/22/2017   Procedure: ESOPHAGOGASTRODUODENOSCOPY (EGD);  Surgeon: Meryl Dare, MD;  Location: [facility] ENDOSCOPY;  Service: Endoscopy;  Laterality: N/A;  . KNEE ARTHROSCOPY Bilateral    "2 on left; 1 on the right"  . SHOULDER OPEN ROTATOR CUFF REPAIR Bilateral      Allergies Sulfa antibiotics  Family History  Problem Relation Age of Onset  . Hypertension Brother   . Hypertension Father   . Diabetes Other   . Hyperlipidemia Other     Social History Social History   Tobacco Use  . Smoking status: Current Every Day Smoker    Packs/day: 1.00    Years: 34.00    Pack years: 34.00    Types: Cigarettes  . Smokeless tobacco: Former Neurosurgeon    Types: Snuff, Chew  . Tobacco comment: 8/910/2017 "3 ppd before 2015"  Substance Use Topics  . Alcohol use: Yes    Comment: 07/29/2016 "quit drinking in 2004"  . Drug use: No    Review of Systems  Constitutional: No fever/chills Eyes: No visual changes. Positive eye pain and drainage.  ENT: No sore throat. Cardiovascular: Denies chest pain. Respiratory: Denies shortness of breath. Gastrointestinal: No abdominal pain.  No nausea, no vomiting.  No diarrhea.  No constipation. Genitourinary: Negative for dysuria. Musculoskeletal: Negative for back pain. Skin: Ulceration to the left index finger.  Neurological: Negative for headaches, focal weakness or numbness.  10-point ROS otherwise negative.  ____________________________________________   PHYSICAL EXAM:  VITAL SIGNS: ED Triage Vitals  Enc Vitals Group     BP 01/20/19 1743 119/83     Pulse Rate 01/20/19 1743 (!) 102     Resp 01/20/19 1743 16     Temp 01/20/19 1743 98.4 F (36.9 C)     Temp Source 01/20/19 1743 Oral     SpO2 01/20/19 1743 98 %     Weight 01/20/19 1748 (!) 315 lb (142.9 kg)     Height 01/20/19 1748 6\' 5"  (1.956 m)     Pain Score 01/20/19 1742 8   Constitutional: Alert and oriented. Well appearing and in no acute distress. Eyes: Conjunctivae are normal. Erythema over the lateral eyelids bilaterally. No visible FB on evaluation of the lids.  Head: Atraumatic. Nose: No congestion/rhinnorhea. Mouth/Throat: Mucous membranes are moist. Neck: No stridor.  Cardiovascular: Normal rate, regular rhythm. Good peripheral  circulation. Grossly normal heart sounds.   Respiratory: Normal respiratory effort.  No retractions. Lungs CTAB. Gastrointestinal: No distention.  Musculoskeletal: Left index finger with chronic appearing wound. No active drainage, foul odor, erythema, or edema. Normal ROM of the finger.  Neurologic:  Normal speech and language. No weakness. Diminished sensation bilaterally.  Skin:  Skin is warm, dry and intact. No rash noted.  ____________________________________________   LABS (all labs ordered are listed, but only abnormal results are displayed)  Labs Reviewed  CBG MONITORING, ED - Abnormal; Notable for the following components:      Result Value   Glucose-Capillary 194 (*)    All other components within normal limits   ____________________________________________  RADIOLOGY  Dg Finger Index Left  Result Date: 01/20/2019 CLINICAL DATA:  LEFT index finger ulcer and pain. Initial  encounter. EXAM: LEFT INDEX FINGER 2+V COMPARISON:  None. FINDINGS: Soft tissue swelling noted. No fracture, subluxation or dislocation identified. Joint spaces are unremarkable. No radiopaque foreign body identified. IMPRESSION: Soft tissue swelling without bony abnormality or radiopaque foreign body. Electronically Signed   By: Harmon PierJeffrey  Hu M.D.   On: 01/20/2019 20:44    ____________________________________________   PROCEDURES  Procedure(s) performed:   Procedures  None ____________________________________________   INITIAL IMPRESSION / ASSESSMENT AND PLAN / ED COURSE  Pertinent labs & imaging results that were available during my care of the patient were reviewed by me and considered in my medical decision making (see chart for details).  Patient presents to the emergency department for evaluation of eye discomfort.  Patient may be developing a mild conjunctivitis with some lid inflammation.  No abscess.  Symptoms are bilateral.  No concern for deeper space infection, acute glaucoma, or  foreign body.  Plan for fluorescein staining.  Patient's finger has a chronic appearing wound.  No signs of acute infection.  Plan for x-ray and will have the patient follow with his PCP.  The finger has good blood flow.  Hands with diminished sensation bilaterally.   Plain film of the left finger reviewed with no bony abnormality.  Wound appears more chronic.  Advised close PCP follow-up regarding this.  Fluorescein stain of the eye does not reveal a corneal ulcer/abrasion.  Plan for erythromycin ointment for the next 7 days.  Discussed ED return precautions and follow up plan in detail.  ____________________________________________  FINAL CLINICAL IMPRESSION(S) / ED DIAGNOSES  Final diagnoses:  Acute bacterial conjunctivitis of both eyes  Open wound of finger, initial encounter     MEDICATIONS GIVEN DURING THIS VISIT:  Medications  fluorescein ophthalmic strip 1 strip (1 strip Both Eyes Given 01/20/19 2043)     NEW OUTPATIENT MEDICATIONS STARTED DURING THIS VISIT:  Discharge Medication List as of 01/20/2019  8:58 PM    START taking these medications   Details  erythromycin ophthalmic ointment Place a 1/2 inch ribbon of ointment into the bilateral lower eyelid TID for 7 days., Print        Note:  This document was prepared using Dragon voice recognition software and may include unintentional dictation errors.  Alona BeneJoshua Mercedez Boule, MD Emergency Medicine    Yocelyn Brocious, Arlyss RepressJoshua G, MD 01/21/19 [phone]

## 2019-01-20 NOTE — Discharge Instructions (Signed)
You have an eye infection called conjunctivitis.  This can be caused by a viral or bacterial infection, but we treat with antibiotics either way to be safe.  Please use the provided antibiotics or fill the provided prescription and use as directed.  Follow up as indicated on your instructions.  Return to the Emergency Department if your symptoms worsen in spite of treatment or if you develop new symptoms that concern you. ° °

## 2019-01-22 DIAGNOSIS — J449 Chronic obstructive pulmonary disease, unspecified: Secondary | ICD-10-CM | POA: Insufficient documentation

## 2019-01-23 ENCOUNTER — Other Ambulatory Visit: Payer: Self-pay | Admitting: Orthopedic Surgery

## 2019-01-23 DIAGNOSIS — M75100 Unspecified rotator cuff tear or rupture of unspecified shoulder, not specified as traumatic: Secondary | ICD-10-CM

## 2019-01-23 DIAGNOSIS — M25512 Pain in left shoulder: Secondary | ICD-10-CM

## 2019-01-30 ENCOUNTER — Ambulatory Visit
Admission: RE | Admit: 2019-01-30 | Discharge: 2019-01-30 | Disposition: A | Discharge status: Home / Self Care | Payer: Medicare Other | Source: Ambulatory Visit | Attending: Orthopedic Surgery | Admitting: Orthopedic Surgery

## 2019-01-30 DIAGNOSIS — M25512 Pain in left shoulder: Secondary | ICD-10-CM

## 2019-01-30 DIAGNOSIS — M75100 Unspecified rotator cuff tear or rupture of unspecified shoulder, not specified as traumatic: Secondary | ICD-10-CM

## 2019-02-07 ENCOUNTER — Telehealth: Payer: Self-pay | Admitting: Cardiovascular Disease

## 2019-02-07 NOTE — Telephone Encounter (Signed)
Called patient back. He is currently not having active chest pains, but does mention that for the past year he has had the dull chest pains, but here recently he has had left arm pain, jaw pain, and even at times back pain. Patient denies any of these symptoms currently, patient is unable to take his BP/HR and has not been checking it. He does mention having swelling in his hands/feet. Patient does have appointment on Monday to see Dr.[place], I advised with patient that if over the weekend these symptoms came back to go immediately to the hospital, as not to wait for that appointment.  Patient verbalized understanding, to take it easy this weekend, and to go to ED if symptoms came back.

## 2019-02-07 NOTE — Telephone Encounter (Signed)
Pt c/o of Chest Pain: STAT if CP now or developed within 24 hours  1. Are you having CP right now? no  2. Are you experiencing any other symptoms (ex. SOB, nausea, vomiting, sweating)? Patient states the pain goes down his arm  3. How long have you been experiencing CP? For about a year   4. Is your CP continuous or coming and going? Comes and goes  5. Have you taken Nitroglycerin? ?  I did schedule him an appt with Dr. [place] Salvia for Monday 2/24

## 2019-02-12 ENCOUNTER — Ambulatory Visit: Payer: Medicare Other | Admitting: Cardiovascular Disease

## 2019-02-16 ENCOUNTER — Ambulatory Visit: Payer: Medicare Other | Admitting: Cardiovascular Disease

## 2019-02-16 DIAGNOSIS — G8929 Other chronic pain: Secondary | ICD-10-CM | POA: Insufficient documentation

## 2019-02-16 DIAGNOSIS — M549 Dorsalgia, unspecified: Secondary | ICD-10-CM

## 2019-02-16 DIAGNOSIS — I82409 Acute embolism and thrombosis of unspecified deep veins of unspecified lower extremity: Secondary | ICD-10-CM | POA: Insufficient documentation

## 2019-02-16 DIAGNOSIS — G629 Polyneuropathy, unspecified: Secondary | ICD-10-CM | POA: Insufficient documentation

## 2019-02-16 DIAGNOSIS — Z6841 Body Mass Index (BMI) 40.0 and over, adult: Secondary | ICD-10-CM | POA: Insufficient documentation

## 2019-02-16 HISTORY — DX: Acute embolism and thrombosis of unspecified deep veins of unspecified lower extremity: I82.409

## 2019-02-16 HISTORY — DX: Body Mass Index (BMI) 40.0 and over, adult: Z684

## 2019-02-16 NOTE — Progress Notes (Deleted)
Cardiology Office Note   Date:  02/16/2019   ID:  Joseph [name], DOB 05/08/1971, MRN [id number]  PCP:  [name], NP  Cardiologist:   Skeet Latch, MD   No chief complaint on file.     History of Present Illness: Joseph [name] is a 48 y.o. male with CAD s/p LCx PCI 07/2016,  HTN, HL, DM Type II (A1c 7.1 07/2015), pror PE, and OSA who presents for follow up.  He was was first seen 07/2015 for pre-operative clearance prior to right knee and left hip surgery.  At that time he was feeling well but was unable to exercise due to orthopedic limitations, so he was referred for [facility] 08/12/15 that revealed LVEF 50% and a small area in the basal inferolateral region thought to be either soft tissue attenuation vs prior infarct with mild peri-infarct ischemia.  He was seen in the ED 07/2016 with unstable angina.  He underwent LHC 07/29/16 and was found to have an 80% LCx lesion that was successfully stented.  He followed up with Almyra Deforest, PA-C on 09/06/16, At which time he continued to report episodes of chest pain. He was referred for stress test but did not complete it.  He was last seen in 2017 and was again referred for a stress test that he did not complete.    He continues to smoke half a pack of cigarettes daily. He is not interested in time to quit again at this time. He previously used nitroglycerin patches without success. He has been on Wellbutrin for years.   Past Medical History:  Diagnosis Date  . Anxiety   . Arthritis    "knees, shoulders, hips, ankles" (07/29/2016)  . Asthma   . CAD (coronary artery disease)   . Chronic bronchitis ([place])   . Chronic ulcer of right great toe ([place]) 09/19/2017  . COPD (chronic obstructive pulmonary disease) ([place])   . Depression   . DVT (deep venous thrombosis) ([place])   . GERD (gastroesophageal reflux disease)   . Headache    "about 3 times/month" (07/29/2016)  . Hyperlipidemia   . Hypertension   . Myocardial infarction  ([place]) 1996   "light one"  . PE (pulmonary embolism) 04/2013  . Peripheral nerve disease   . Pneumonia "several times"  . Type II diabetes mellitus ([place])     Past Surgical History:  Procedure Laterality Date  . AMPUTATION Right 09/21/2017   Procedure: RIGHT GREAT TOE AMPUTATION, POSSIBLE VAC;  Surgeon: Leandrew Koyanagi, MD;  Location: [facility];  Service: Orthopedics;  Laterality: Right;  . CARDIAC CATHETERIZATION  2006  . CARDIAC CATHETERIZATION  1996   "@ [facility]; when I had my heart attack"  . CARDIAC CATHETERIZATION N/A 07/29/2016   Procedure: Left Heart Cath and Coronary Angiography;  Surgeon: Lorretta Harp, MD;  Location: [facility];  Service: Cardiovascular;  Laterality: N/A;  . CARDIAC CATHETERIZATION N/A 07/29/2016   Procedure: Coronary Stent Intervention;  Surgeon: Lorretta Harp, MD;  Location: [facility];  Service: Cardiovascular;  Laterality: N/A;  . CARPAL TUNNEL RELEASE Bilateral   . CORONARY ANGIOPLASTY WITH STENT PLACEMENT  07/29/2016  . ESOPHAGOGASTRODUODENOSCOPY N/A 09/22/2017   Procedure: ESOPHAGOGASTRODUODENOSCOPY (EGD);  Surgeon: Ladene Artist, MD;  Location[facility] END[facility]COPY;  Service: Endoscopy;  Laterality: N/A;  . KNEE ARTHROSCOPY Bilateral    "2 on left; 1 on the right"  . SHOULDER OPEN ROTATOR CUFF REPAIR Bilateral      Current Outpatient  Medications  Medication Sig Dispense Refill  . albuterol (PROAIR HFA) 108 (90 BASE) MCG/ACT inhaler Inhale 2 puffs into the lungs every 6 (six) hours as needed for wheezing or shortness of breath.     Marland Kitchen albuterol (PROVENTIL) (2.5 MG/3ML) 0.083% nebulizer solution Take 2.5 mg by nebulization every 4 (four) hours as needed for wheezing or shortness of breath.    . ALPRAZolam (XANAX) 1 MG tablet Take 1-2 mg by mouth 4 (four) times daily. 2 mg every evening    . amLODipine (NORVASC) 2.5 MG tablet Take 2.5 mg by mouth every morning.     Marland Kitchen amoxicillin-clavulanate (AUGMENTIN) 875-125 MG tablet amoxicillin 875 mg-potassium  clavulanate 125 mg tablet    . aspirin 81 MG chewable tablet Chew 1 tablet (81 mg total) by mouth daily. 81 tablet 30  . atorvastatin (LIPITOR) 40 MG tablet Take 1 tablet (40 mg total) by mouth daily. (Patient taking differently: Take 40 mg by mouth daily at 6 PM. ) 30 tablet 6  . blood glucose meter kit and supplies KIT Dispense based on patient and insurance preference. Use up to four times daily as directed. (FOR ICD-9 250.00, 250.01). 1 each 0  . buPROPion (WELLBUTRIN SR) 150 MG 12 hr tablet Take 1 tablet by mouth 2 (two) times daily.  0  . clopidogrel (PLAVIX) 75 MG tablet Take 1 tablet (75 mg total) by mouth daily. Please schedule yearly appointment for more refills, thank you! 1st attempt 30 tablet 0  . doxycycline (VIBRA-TABS) 100 MG tablet Take 1 tablet (100 mg total) by mouth 2 (two) times daily. (Patient not taking: Reported on 11/24/2017) 24 tablet 0  . erythromycin ophthalmic ointment Place a 1/2 inch ribbon of ointment into the bilateral lower eyelid TID for 7 days. 1 g 0  . fenofibrate (TRICOR) 145 MG tablet Take 145 mg by mouth every morning.     Marland Kitchen HUMALOG KWIKPEN 100 UNIT/ML SOPN Inject 35-40 Units into the skin 3 (three) times daily with meals. As directed per sliding scale    . LANTUS SOLOSTAR 100 UNIT/ML Solostar Pen Inject 70 Units into the skin 2 (two) times daily.     Marland Kitchen LYRICA 50 MG capsule Take 50-100 mg by mouth See admin instructions. 100 mg every morning and 50 mg every evening  5  . meclizine (ANTIVERT) 25 MG tablet Take 25 mg by mouth 3 (three) times daily as needed for dizziness. 25 mg every morning and 25 mg every evening.  May take an additional 25 mg during the day    . metFORMIN (GLUCOPHAGE) 1000 MG tablet Take 1,000 mg by mouth 2 (two) times daily with a meal.      . metoCLOPramide (REGLAN) 5 MG tablet Take 1 tablet (5 mg total) by mouth 4 (four) times daily -  before meals and at bedtime. 120 tablet 0  . nitroGLYCERIN (NITROSTAT) 0.4 MG SL tablet PLACE 1 TABLET UNDER  THE TONGUE EVERY 5 MINUTES AS NEEDED FOR CHEST PAIN 25 tablet 0  . ondansetron (ZOFRAN) 4 MG tablet Take 1-2 tablets (4-8 mg total) by mouth every 8 (eight) hours as needed for nausea or vomiting. 40 tablet 0  . ondansetron (ZOFRAN) 4 MG tablet Take 1-2 tablets (4-8 mg total) by mouth every 8 (eight) hours as needed for nausea or vomiting. 40 tablet 0  . oxyCODONE (ROXICODONE) 15 MG immediate release tablet Take 15 mg by mouth 5 (five) times daily.     . pantoprazole (PROTONIX) 40 MG tablet Take 1  tablet (40 mg total) by mouth daily. 30 tablet 0  . polyethylene glycol (MIRALAX / GLYCOLAX) packet Take 17 g by mouth daily. (Patient not taking: Reported on 11/24/2017) 14 each 0  . promethazine (PHENERGAN) 25 MG tablet Take 25 mg by mouth every 6 (six) hours as needed for nausea.     . Rivaroxaban (XARELTO) 20 MG TABS Take 1 tablet (20 mg total) by mouth daily. (Patient taking differently: Take 20 mg by mouth daily. Take with food) 30 tablet 1   No current facility-administered medications for this visit.     Allergies:   Sulfa antibiotics    Social History:  The patient  reports that he has been smoking cigarettes. He has a 34.00 pack-year smoking history. He has quit using smokeless tobacco.  His smokeless tobacco use included snuff and chew. He reports current alcohol use. He reports that he does not use drugs.   Family History:  The patient's family history includes Diabetes in an other family member; Hyperlipidemia in an other family member; Hypertension in his brother and father.    ROS:  Please see the history of present illness.   Otherwise, review of systems are positive for none.   All other systems are reviewed and negative.    PHYSICAL EXAM: VS:  There were no vitals taken for this visit. , BMI There is no height or weight on file to calculate BMI. GENERAL:  Well appearing.  No acute distress HEENT:  Pupils equal round and reactive, fundi not visualized, oral mucosa  unremarkable NECK:  No jugular venous distention, waveform within normal limits, carotid upstroke brisk and symmetric, no bruits LYMPHATICS:  No cervical adenopathy LUNGS:  Clear to auscultation bilaterally HEART:  RRR.  PMI not displaced or sustained,S1 and S2 within normal limits, no S3, no S4, no clicks, no rubs, no murmurs ABD:  Flat, positive bowel sounds normal in frequency in pitch, no bruits, no rebound, no guarding, no midline pulsatile mass, no hepatomegaly, no splenomegaly EXT:  2 plus pulses throughout, no edema, no cyanosis no clubbing SKIN:  No rashes no nodules NEURO:  Cranial nerves II through XII grossly intact, motor grossly intact throughout PSYCH:  Cognitively intact, oriented to person place and time   EKG:  EKG is ordered today. The ekg ordered 08/27/16 demonstrates sinus tachycardia at 110 bpm.  Absent R wave progression.  Non-specific ST elevation V2.  Prior inferior MI. 09/29/16: Sinus rhythm. Rate 99 bpm. Left axis deviation. Prior inferior infarct. Prior anterior infarct.  Echo 07/30/16: Study Conclusions  - Left ventricle: The cavity size was normal. Wall thickness was   increased in a pattern of mild LVH. Systolic function was normal.   The estimated ejection fraction was in the range of 55% to 60%.   Wall motion was normal; there were no regional wall motion   abnormalities. Doppler parameters are consistent with abnormal   left ventricular relaxation (grade 1 diastolic dysfunction). - Aortic root: The aortic root was mildly dilated.  Impressions:  - Technically difficult; definity used; normal LV systolic   function; grade 1 diastolic dysfunction; indeterminant filling   pressure; fusion of left and right aortic cusps; no AS or AI by   doppler; mildly dilated aortic root.   LHC 07/29/16:  Dist Cx lesion, 80 %stenosed.  Post intervention, there is a 0% residual stenosis.  A stent was successfully placed.   Recent Labs: No results found for  requested labs within last 8760 hours.    Lipid Panel  Component Value Date/Time   CHOL 179 09/29/2016 1134   TRIG 300 (H) 09/29/2016 1134   HDL 27 (L) 09/29/2016 1134   CHOLHDL 6.6 (H) 09/29/2016 1134   VLDL 60 (H) 09/29/2016 1134   LDLCALC 92 09/29/2016 1134   LDLDIRECT 120.0 05/28/2015 0951      Wt Readings from Last 3 Encounters:  01/20/19 (!) 315 lb (142.9 kg)  09/25/18 (!) 317 lb (143.8 kg)  07/13/18 (!) 317 lb (143.8 kg)      Other studies Reviewed: Additional studies/ records that were reviewed today include:  Review of the above records demonstrates:  Please see elsewhere in the note.     ASSESSMENT AND PLAN:  # CAD s/p LCx PCI: Joseph [name] Continues to have exertional chest pain. He was previously ordered to have a repeat stress test but has not yet completed this due to family illness. He is agreeable to rescheduling this test. In the meantime he will continue aspirin, Plavix, and atorvastatin.  He is not interested in long-acting nitroglycerin because of headaches.  He should be on a beta blocker.  This will be addressed at follow up.  He was advised to go to the ED if he has chest pain that persists after 3 sublingual nitroglycerin.  # Smoking cessation: We discussed tobacco use.  Joseph [name] is continuing to cut back on his smoking.  He is not interested in pharmacologic intervention at this time. We discussed smoking cessation for 5 minutes.    # Hyperlipidemia: We will check fasting lipids and a CMP.  Continue atorvastatin.   Current medicines are reviewed at length with the patient today.  The patient does not have concerns regarding medicines.  The following changes have been made:  no change  Labs/ tests ordered today include: Lexiscan Myoview   No orders of the defined types were placed in this encounter.    Disposition:   FU with Dr. Jonelle Sidle C. Singac in 3 months    Signed, Skeet Latch, MD  02/16/2019 7:58 AM    [place]

## 2019-03-08 ENCOUNTER — Encounter: Payer: Self-pay | Admitting: Cardiology

## 2019-03-08 ENCOUNTER — Ambulatory Visit (INDEPENDENT_AMBULATORY_CARE_PROVIDER_SITE_OTHER): Payer: Medicare Other | Admitting: Cardiology

## 2019-03-08 VITALS — BP 124/82 | HR 96 | Ht 77.0 in | Wt 327.0 lb

## 2019-03-08 DIAGNOSIS — R600 Localized edema: Secondary | ICD-10-CM

## 2019-03-08 DIAGNOSIS — Z9861 Coronary angioplasty status: Secondary | ICD-10-CM

## 2019-03-08 DIAGNOSIS — I2510 Atherosclerotic heart disease of native coronary artery without angina pectoris: Secondary | ICD-10-CM | POA: Diagnosis not present

## 2019-03-08 DIAGNOSIS — R079 Chest pain, unspecified: Secondary | ICD-10-CM | POA: Diagnosis not present

## 2019-03-08 DIAGNOSIS — I10 Essential (primary) hypertension: Secondary | ICD-10-CM | POA: Diagnosis not present

## 2019-03-08 DIAGNOSIS — G8929 Other chronic pain: Secondary | ICD-10-CM | POA: Insufficient documentation

## 2019-03-08 DIAGNOSIS — Z86711 Personal history of pulmonary embolism: Secondary | ICD-10-CM

## 2019-03-08 DIAGNOSIS — F17200 Nicotine dependence, unspecified, uncomplicated: Secondary | ICD-10-CM

## 2019-03-08 DIAGNOSIS — E785 Hyperlipidemia, unspecified: Secondary | ICD-10-CM

## 2019-03-08 DIAGNOSIS — G894 Chronic pain syndrome: Secondary | ICD-10-CM

## 2019-03-08 MED ORDER — CARVEDILOL 3.125 MG PO TABS: 3.1250 mg | ORAL_TABLET | Freq: Two times a day (BID) | ORAL | 3 refills | Status: DC

## 2019-03-08 MED ORDER — NITROGLYCERIN 0.4 MG SL SUBL: 0.4000 mg | SUBLINGUAL_TABLET | SUBLINGUAL | 3 refills | Status: DC | PRN

## 2019-03-08 NOTE — Assessment & Plan Note (Signed)
2014- chronic Xarelto 

## 2019-03-08 NOTE — Assessment & Plan Note (Signed)
Pt seen today with complaints of intermittent squeezing chest pain

## 2019-03-08 NOTE — Assessment & Plan Note (Signed)
Followed by PCP

## 2019-03-08 NOTE — Progress Notes (Signed)
03/08/2019 [name]   November 27, 1971  [id number]  Primary Physician [name], NP Primary Cardiologist: Dr Oval Linsey  HPI:   The patient is a pleasant 48 year old male, [place], who has a history of normal coronaries in 2006.  He has multiple other medical problems including insulin-dependent diabetes, hypertension, dyslipidemia, sleep apnea, chronic pain, prior pulmonary embolism in 2014 on chronic Xarelto, smoking, obesity, at one point he was over 600 pounds.  In 2017 he presented with chest pain.  Catheterization was done and he had an 80% distal circumflex.  This was treated with a bare-metal stent.  He had no other significant coronary disease at that time.  Echocardiogram done in August 2017 showed an ejection fraction of 55 to 60% with mild LVH.  His last office visit was October 2017.  Since we saw the patient last he has been in the emergency room several times for various complaints.  He was admitted in October 2018 with a diabetic foot ulcer that required amputation of his right great toe through the MTP joint.  He was sent to the office today by his PCP.  The patient has had intermittent chest pain and has also noted lower extremity edema over the past few weeks.  On exam today he really has no significant edema.  His chest pain is not always exertional.  It is described as a squeezing pain.  Sometimes it radiates to his left arm.  He has taken nitroglycerin for this.   Current Outpatient Medications  Medication Sig Dispense Refill  . albuterol (PROAIR HFA) 108 (90 BASE) MCG/ACT inhaler Inhale 2 puffs into the lungs every 6 (six) hours as needed for wheezing or shortness of breath.     Marland Kitchen albuterol (PROVENTIL) (2.5 MG/3ML) 0.083% nebulizer solution Take 2.5 mg by nebulization every 4 (four) hours as needed for wheezing or shortness of breath.    . ALPRAZolam (XANAX) 1 MG tablet Take 1-2 mg by mouth 4 (four) times daily. 2 mg every evening    .  amLODipine (NORVASC) 2.5 MG tablet Take 2.5 mg by mouth every morning.     Marland Kitchen aspirin 81 MG chewable tablet Chew 1 tablet (81 mg total) by mouth daily. 81 tablet 30  . atorvastatin (LIPITOR) 40 MG tablet Take 1 tablet (40 mg total) by mouth daily. (Patient taking differently: Take 40 mg by mouth daily at 6 PM. ) 30 tablet 6  . blood glucose meter kit and supplies KIT Dispense based on patient and insurance preference. Use up to four times daily as directed. (FOR ICD-9 250.00, 250.01). 1 each 0  . buPROPion (WELLBUTRIN SR) 150 MG 12 hr tablet Take 1 tablet by mouth 2 (two) times daily.  0  . clopidogrel (PLAVIX) 75 MG tablet Take 1 tablet (75 mg total) by mouth daily. Please schedule yearly appointment for more refills, thank you! 1st attempt 30 tablet 0  . fenofibrate (TRICOR) 145 MG tablet Take 145 mg by mouth every morning.     Marland Kitchen HUMALOG KWIKPEN 100 UNIT/ML SOPN Inject 35-40 Units into the skin 3 (three) times daily with meals. As directed per sliding scale    . LANTUS SOLOSTAR 100 UNIT/ML Solostar Pen Inject 70 Units into the skin 2 (two) times daily.     Marland Kitchen lisinopril-hydrochlorothiazide (PRINZIDE,ZESTORETIC) 20-12.5 MG tablet Take 1 tablet by mouth daily at 12 noon.    Marland Kitchen LYRICA 50 MG capsule Take 50-100 mg by mouth See admin instructions. 100 mg every  morning and 50 mg every evening  5  . meclizine (ANTIVERT) 25 MG tablet Take 25 mg by mouth 3 (three) times daily as needed for dizziness. 25 mg every morning and 25 mg every evening.  May take an additional 25 mg during the day    . metFORMIN (GLUCOPHAGE) 1000 MG tablet Take 1,000 mg by mouth 2 (two) times daily with a meal.      . nitroGLYCERIN (NITROSTAT) 0.4 MG SL tablet Place 1 tablet (0.4 mg total) under the tongue every 5 (five) minutes as needed for chest pain. 25 tablet 3  . oxyCODONE (ROXICODONE) 15 MG immediate release tablet Take 15 mg by mouth 5 (five) times daily.     . pantoprazole (PROTONIX) 40 MG tablet Take 1 tablet (40 mg total) by  mouth daily. 30 tablet 0  . promethazine (PHENERGAN) 25 MG tablet Take 25 mg by mouth every 6 (six) hours as needed for nausea.     . Rivaroxaban (XARELTO) 20 MG TABS Take 1 tablet (20 mg total) by mouth daily. (Patient taking differently: Take 20 mg by mouth daily. Take with food) 30 tablet 1  . carvedilol (COREG) 3.125 MG tablet Take 1 tablet (3.125 mg total) by mouth 2 (two) times daily. 180 tablet 3   No current facility-administered medications for this visit.     Allergies  Allergen Reactions  . Sulfa Antibiotics Other (See Comments)    headache    Past Medical History:  Diagnosis Date  . Anxiety   . Arthritis    "knees, shoulders, hips, ankles" (07/29/2016)  . Asthma   . CAD (coronary artery disease)   . Chronic bronchitis ([place])   . Chronic ulcer of right great toe ([place]) 09/19/2017  . COPD (chronic obstructive pulmonary disease) ([place])   . Depression   . DVT (deep venous thrombosis) ([place])   . GERD (gastroesophageal reflux disease)   . Headache    "about 3 times/month" (07/29/2016)  . Hyperlipidemia   . Hypertension   . Myocardial infarction ([place]) 1996   "light one"  . PE (pulmonary embolism) 04/2013  . Peripheral nerve disease   . Pneumonia "several times"  . Type II diabetes mellitus ([place])     Social History   Socioeconomic History  . Marital status: Married    Spouse name: Not on file  . Number of children: Not on file  . Years of education: Not on file  . Highest education level: Not on file  Occupational History  . Occupation: disabled     Fish farm manager: UNEMPLOYED  Social Needs  . Financial resource strain: Not on file  . Food insecurity:    Worry: Not on file    Inability: Not on file  . Transportation needs:    Medical: Not on file    Non-medical: Not on file  Tobacco Use  . Smoking status: Current Every Day Smoker    Packs/day: 1.00    Years: 34.00    Pack years: 34.00    Types: Cigarettes  . Smokeless tobacco: Former Systems developer    Types: Snuff, Chew   . Tobacco comment: 8/910/2017 "3 ppd before 2015"  Substance and Sexual Activity  . Alcohol use: Yes    Comment: 07/29/2016 "quit drinking in 2004"  . Drug use: No  . Sexual activity: Yes  Lifestyle  . Physical activity:    Days per week: Not on file    Minutes per session: Not on file  . Stress: Not on file  Relationships  .  Social connections:    Talks on phone: Not on file    Gets together: Not on file    Attends religious service: Not on file    Active member of club or organization: Not on file    Attends meetings of clubs or organizations: Not on file    Relationship status: Not on file  . Intimate partner violence:    Fear of current or ex partner: Not on file    Emotionally abused: Not on file    Physically abused: Not on file    Forced sexual activity: Not on file  Other Topics Concern  . Not on file  Social History Narrative  . Not on file     Family History  Problem Relation Age of Onset  . Hypertension Brother   . Hypertension Father   . Diabetes Other   . Hyperlipidemia Other      Review of Systems: General: negative for chills, fever, night sweats or weight changes.  Cardiovascular: negative for orthopnea, palpitations, paroxysmal nocturnal dyspnea or shortness of breath Dermatological: negative for rash Respiratory: negative for wheezing Urologic: negative for hematuria Abdominal: negative for nausea, vomiting, diarrhea, bright red blood per rectum, melena, or hematemesis Neurologic: negative for visual changes, syncope, or dizziness All other systems reviewed and are otherwise negative except as noted above.    Blood pressure 124/82, pulse 96, height 6' 5"  (1.956 m), weight (!) 327 lb (148.3 kg).  General appearance: alert, cooperative, no distress, morbidly obese and poor dentition Neck: no JVD Lungs: decreased breath sounds, scattered rhonchi Heart: regular rate and rhythm Extremities: trace (if any) edema Skin: pale warm dry Neurologic:  Grossly normal  EKG NSR, HR 96- inferior Qs (old)  ASSESSMENT AND PLAN:   Chest pain, moderate coronary artery risk Pt seen today with complaints of intermittent squeezing chest pain   CAD -S/P PCI S/P distal CFX PCI with BMS Aug 2017- no other significant CAD and normal LVF  Benign hypertension Controlled  History of pulmonary embolus (PE) 2014- chronic Xarelto  Insulin dependent type 2 diabetes mellitus (HCC) Uncontrolled type 2 IDDM  Morbid obesity (HCC) BMI 38  Dyslipidemia, goal LDL below 70 Followed by PCP  Smoker 1/2 ppd  Chronic pain On Oxycontin   PLAN the patient has complaints of chest pain and has multiple risk factors.  His symptoms are both typical and atypical.  I believe he warrants further evaluation and will arrange for a Lexiscan Myoview.  He also has complaints of edema although not present today.  We will obtain an echocardiogram for LV function.  I did add carvedilol low-dose.  Follow-up will be depending on these test results.  Joseph Ransom PA-C 03/08/2019 4:36 PM

## 2019-03-08 NOTE — Assessment & Plan Note (Signed)
Uncontrolled type 2 IDDM

## 2019-03-08 NOTE — Assessment & Plan Note (Signed)
On Oxycontin

## 2019-03-08 NOTE — Assessment & Plan Note (Signed)
Controlled.  

## 2019-03-08 NOTE — Assessment & Plan Note (Signed)
BMI 38 

## 2019-03-08 NOTE — Patient Instructions (Addendum)
Medication Instructions:  START Coreg 3.125 take 1 tablet twice a day If you need a refill on your cardiac medications before your next appointment, please call your pharmacy.   Lab work: None  If you have labs (blood work) drawn today and your tests are completely normal, you will receive your results only by: Marland Kitchen MyChart Message (if you have MyChart) OR . A paper copy in the mail If you have any lab test that is abnormal or we need to change your treatment, we will call you to review the results.  Testing/Procedures: Your physician has requested that you have an echocardiogram. Echocardiography is a painless test that uses sound waves to create images of your heart. It provides your doctor with information about the size and shape of your heart and how well your heart's chambers and valves are working. This procedure takes approximately one hour. There are no restrictions for this procedure. [address]  Your physician has requested that you have a lexiscan myoview. For further information please visit [email or web address] Please follow instruction sheet, as given. [address]  Follow-Up: At [facility], you and your health needs are our priority.  As part of our continuing mission to provide you with exceptional heart care, we have created designated Provider Care Teams.  These Care Teams include your primary Cardiologist (physician) and Advanced Practice Providers (APPs -  Physician Assistants and Nurse Practitioners) who all work together to provide you with the care you need, when you need it. . FOLLOW UP IS PENDING THE RESULTS OF BOTH TESTS  Any Other Special Instructions Will Be Listed Below (If Applicable).

## 2019-03-08 NOTE — Assessment & Plan Note (Signed)
1/2 ppd 

## 2019-03-08 NOTE — Assessment & Plan Note (Signed)
S/P distal CFX PCI with BMS Aug 2017- no other significant CAD and normal LVF

## 2019-03-22 ENCOUNTER — Telehealth: Payer: Self-pay | Admitting: Cardiovascular Disease

## 2019-03-22 ENCOUNTER — Telehealth: Payer: Self-pay | Admitting: Cardiology

## 2019-03-22 NOTE — Telephone Encounter (Signed)
I called to see how he was doing.  Myoview and echo were ordered but rescheduled secondary to COVID-19. Left a message for patient to call back.  Corine Shelter PA-C 03/22/2019 11:23 AM

## 2019-03-22 NOTE — Telephone Encounter (Signed)
Left voicemail for patient to call nurse for Dr Darrin Nipper to see if his symptoms are improved. He is scheduled for echo and stress test on 4/13 If symptoms better can cancel if not need to do both

## 2019-03-27 ENCOUNTER — Ambulatory Visit (HOSPITAL_COMMUNITY): Payer: Medicare Other

## 2019-03-27 ENCOUNTER — Encounter (HOSPITAL_COMMUNITY): Payer: Medicare Other

## 2019-03-28 ENCOUNTER — Encounter (HOSPITAL_COMMUNITY): Payer: Medicare Other

## 2019-03-28 ENCOUNTER — Ambulatory Visit (HOSPITAL_COMMUNITY): Payer: Medicare Other

## 2019-03-28 ENCOUNTER — Ambulatory Visit (HOSPITAL_COMMUNITY): Payer: Medicare Other | Attending: Cardiology

## 2019-03-29 ENCOUNTER — Ambulatory Visit (HOSPITAL_COMMUNITY): Payer: Medicare Other

## 2019-03-29 ENCOUNTER — Telehealth: Payer: Self-pay

## 2019-03-29 NOTE — Telephone Encounter (Signed)
New message    Just an FYI. We have made several attempts to contact this patient including voice mail and reschedule to [address] location with address and instruction. The appt was on 4/8  @ [address]. The patient called and cancel appt.    Thank you

## 2019-03-30 NOTE — Telephone Encounter (Signed)
Thank you :)

## 2019-04-02 ENCOUNTER — Other Ambulatory Visit (HOSPITAL_COMMUNITY): Payer: Medicare Other

## 2019-04-17 ENCOUNTER — Telehealth: Payer: Self-pay

## 2019-04-17 ENCOUNTER — Ambulatory Visit: Payer: Medicare Other | Admitting: Cardiology

## 2019-04-17 NOTE — Telephone Encounter (Signed)
Contacted patient informed him that since he did not have his Echo and Lexiscan, due to Covid-19, that this appt is not needed unless he needed to address any concerns with Franky Macho. He stated he was fine and would contact the office if he needed to. Advised patient that we are here if he needs [place]. He voiced understanding. Appt with Franky Macho today has been canceled.

## 2019-05-17 ENCOUNTER — Telehealth (HOSPITAL_COMMUNITY): Payer: Self-pay

## 2019-05-17 NOTE — Telephone Encounter (Signed)
LMTCB COVID prescreening for echo. Left detailed message for echo appt.

## 2019-05-18 ENCOUNTER — Other Ambulatory Visit (HOSPITAL_COMMUNITY): Payer: Medicare Other

## 2019-05-18 ENCOUNTER — Telehealth: Payer: Self-pay | Admitting: Cardiovascular Disease

## 2019-05-18 NOTE — Telephone Encounter (Signed)
Follow up:   Patient returning your call and states that 8 am is fine. Please call patient if any questions.

## 2019-05-22 ENCOUNTER — Telehealth (HOSPITAL_COMMUNITY): Payer: Self-pay | Admitting: *Deleted

## 2019-05-22 NOTE — Telephone Encounter (Signed)
Close encounter 

## 2019-05-23 ENCOUNTER — Other Ambulatory Visit: Payer: Self-pay

## 2019-05-23 ENCOUNTER — Ambulatory Visit (HOSPITAL_COMMUNITY)
Admission: RE | Admit: 2019-05-23 | Discharge: 2019-05-23 | Disposition: A | Discharge status: Home / Self Care | Payer: Medicare Other | Source: Ambulatory Visit | Attending: Cardiology | Admitting: Cardiology

## 2019-05-23 DIAGNOSIS — R079 Chest pain, unspecified: Secondary | ICD-10-CM

## 2019-05-23 DIAGNOSIS — R600 Localized edema: Secondary | ICD-10-CM | POA: Insufficient documentation

## 2019-05-23 LAB — MYOCARDIAL PERFUSION IMAGING
LV dias vol: 160 mL (ref 62–150)
LV sys vol: 90 mL
Peak HR: 99 {beats}/min
Rest HR: 89 {beats}/min
SDS: 5
SRS: 0
SSS: 5
TID: 0.99

## 2019-05-23 MED ORDER — TECHNETIUM TC 99M TETROFOSMIN IV KIT
32.7000 | PACK | Freq: Once | INTRAVENOUS | Status: AC | PRN
  Administered 2019-05-23: 32.7 via INTRAVENOUS
  Filled 2019-05-23: qty 33

## 2019-05-23 MED ORDER — TECHNETIUM TC 99M TETROFOSMIN IV KIT
11.0000 | PACK | Freq: Once | INTRAVENOUS | Status: AC | PRN
  Administered 2019-05-23: 11 via INTRAVENOUS
  Filled 2019-05-23: qty 11

## 2019-05-23 MED ORDER — REGADENOSON 0.4 MG/5ML IV SOLN
0.4000 mg | Freq: Once | INTRAVENOUS | Status: AC
  Administered 2019-05-23: 0.4 mg via INTRAVENOUS

## 2019-05-24 ENCOUNTER — Telehealth: Payer: Self-pay

## 2019-05-24 ENCOUNTER — Telehealth: Payer: Self-pay | Admitting: Cardiology

## 2019-05-24 DIAGNOSIS — I255 Ischemic cardiomyopathy: Secondary | ICD-10-CM

## 2019-05-24 NOTE — Telephone Encounter (Signed)
Contacted patient to go over cath instructions in great detail as well as get covid test scheduled. Patient understood test instructions, covid instructions and lab instructions.  Advised patient that I would Print copy of cath letter and place up front for him to pick up when he comes to get labs. He voiced understanding.           COVID-19 Pre-Screening Questions:  . In the past 7 to 10 days have you had a cough,  shortness of breath, headache, congestion, fever (100 or greater) body aches, chills, sore throat, or sudden loss of taste or sense of smell? NO . Have you been around anyone with known Covid 19.Have you been around anyone who is awaiting Covid 19 test results in the past 7 to 10 days? No . Have you been around anyone who has been exposed to Covid 19, or has mentioned symptoms of Covid 19 within the past 7 to 10 days? No   If you have any concerns/questions about symptoms patients report during screening (either on the phone or at threshold). Contact the provider seeing the patient or DOD for further guidance.  If neither are available contact a member of the leadership team.

## 2019-05-24 NOTE — Telephone Encounter (Signed)
  [facility] CARDIOVASCULAR DIVISION [facility] [address] [name] [address] Dept: [phone] Loc: [phone]  Joseph [name]  05/24/2019  You are scheduled for a Cardiac Catheterization on Wednesday, June 10 with Dr. Verdis Prime.  1. Please arrive at the [facility] (Main Entrance A) at [facility]: [address] [place], [name] [address] at 8:00 AM (This time is two hours before your procedure to ensure your preparation). Free valet parking service is available.   Special note: Every effort is made to have your procedure done on time. Please understand that emergencies sometimes delay scheduled procedures.  2. Diet: Do not eat solid foods after midnight.  The patient may have clear liquids until 5am upon the day of the procedure.  3. Labs: You will need to have blood drawn on Friday, June 5 at [facility] [address], [place]  Open: 8am - 5pm (Lunch 12:30 - 1:30)   Phone: [phone]. You do not need to be fasting.  4. Medication instructions in preparation for your procedure:   Contrast Allergy: No  PLEASE HOLD THE FOLLOWING MEDICATIONS XARELTO TAKE LAST Sunday 05/27/2019 METFORMIN TAKE LAST DOSE ON Monday 05/28/2019 LISINOPRIL-HYDROCHLOROTHIAZIDE take last dose on 05/28/2019   On the morning of your procedure, take your Aspirin and any morning medicines NOT listed above.  You may use sips of water.  5. Plan for one night stay--bring personal belongings. 6. Bring a current list of your medications and current insurance cards. 7. You MUST have a responsible person to drive you home. 8. Someone MUST be with you the first 24 hours after you arrive home or your discharge will be delayed. 9. Please wear clothes that are easy to get on and off and wear slip-on shoes.  Thank you for allowing [place] to care for you!   -- [facility]

## 2019-05-24 NOTE — Telephone Encounter (Signed)
New Message   Patient calling back please give him a call back at the number provided.

## 2019-05-24 NOTE — Telephone Encounter (Signed)
Discussed with Dr Herbie Baltimore.  We feel the patient needs Rt and Lt heart cath.  I called the patient to discuss this- left message to call us back.  Corine Shelter PA-C 05/24/2019 10:48 AM

## 2019-05-24 NOTE — Addendum Note (Signed)
Addended by: Kandice Robinsons T on: 05/24/2019 05:03 PM   Modules accepted: Orders

## 2019-05-24 NOTE — Telephone Encounter (Signed)
I reviewed Mr. Carlos Levering Myoview results with Dr. Herbie Baltimore.  He feels it is best to proceed with a diagnostic right and left heart catheterization because of the patient's new drop in his LV function.  After discussion with our pharmacist we will hold his Xarelto 48 hours precath.  I will also hold his Glucophage and lisinopril HCTZ 24 hours precath.  His last labs were in 2018, his renal function was normal then, these of course will be repeated prior to his cath.   The patient understands that risks included but are not limited to stroke (1 in 1000), death (1 in 1000), kidney failure [usually temporary] (1 in 500), bleeding (1 in 200), allergic reaction [possibly serious] (1 in 200).  The patient understands and agrees to proceed.   Corine Shelter PA-C 05/24/2019 2:16 PM

## 2019-05-25 ENCOUNTER — Other Ambulatory Visit (HOSPITAL_COMMUNITY)
Admission: RE | Admit: 2019-05-25 | Discharge: 2019-05-25 | Disposition: A | Discharge status: Home / Self Care | Payer: Medicare Other | Source: Ambulatory Visit | Attending: Interventional Cardiology | Admitting: Interventional Cardiology

## 2019-05-25 ENCOUNTER — Other Ambulatory Visit: Payer: Self-pay

## 2019-05-25 DIAGNOSIS — Z1159 Encounter for screening for other viral diseases: Secondary | ICD-10-CM | POA: Insufficient documentation

## 2019-05-25 DIAGNOSIS — I255 Ischemic cardiomyopathy: Secondary | ICD-10-CM

## 2019-05-25 DIAGNOSIS — Z01812 Encounter for preprocedural laboratory examination: Secondary | ICD-10-CM | POA: Insufficient documentation

## 2019-05-26 LAB — BASIC METABOLIC PANEL
BUN/Creatinine Ratio: 15 (ref 9–20)
BUN: 18 mg/dL (ref 6–24)
CO2: 21 mmol/L (ref 20–29)
Calcium: 10.6 mg/dL — ABNORMAL HIGH (ref 8.7–10.2)
Chloride: 103 mmol/L (ref 96–106)
Creatinine, Ser: 1.22 mg/dL (ref 0.76–1.27)
GFR calc Af Amer: 82 mL/min/{1.73_m2} (ref >59)
GFR calc non Af Amer: 71 mL/min/{1.73_m2} (ref >59)
Glucose: 136 mg/dL — ABNORMAL HIGH (ref 65–99)
Potassium: 5.4 mmol/L — ABNORMAL HIGH (ref 3.5–5.2)
Sodium: 142 mmol/L (ref 134–144)

## 2019-05-26 LAB — CBC WITH DIFFERENTIAL/PLATELET
Basophils Absolute: 0.1 10*3/uL (ref 0.0–0.2)
Basos: 1 %
EOS (ABSOLUTE): 0.4 10*3/uL (ref 0.0–0.4)
Eos: 3 %
Hematocrit: 43.9 % (ref 37.5–51.0)
Hemoglobin: 14.9 g/dL (ref 13.0–17.7)
Immature Grans (Abs): 0.1 10*3/uL (ref 0.0–0.1)
Immature Granulocytes: 1 %
Lymphocytes Absolute: 3.1 10*3/uL (ref 0.7–3.1)
Lymphs: 25 %
MCH: 28.9 pg (ref 26.6–33.0)
MCHC: 33.9 g/dL (ref 31.5–35.7)
MCV: 85 fL (ref 79–97)
Monocytes Absolute: 0.9 10*3/uL (ref 0.1–0.9)
Monocytes: 7 %
Neutrophils Absolute: 8.1 10*3/uL — ABNORMAL HIGH (ref 1.4–7.0)
Neutrophils: 63 %
Platelets: 285 10*3/uL (ref 150–450)
RBC: 5.15 x10E6/uL (ref 4.14–5.80)
RDW: 12.9 % (ref 11.6–15.4)
WBC: 12.7 10*3/uL — ABNORMAL HIGH (ref 3.4–10.8)

## 2019-05-26 LAB — NOVEL CORONAVIRUS, NAA (HOSP ORDER, SEND-OUT TO REF LAB; TAT 18-24 HRS): SARS-CoV-2, NAA: NOT DETECTED

## 2019-05-28 ENCOUNTER — Telehealth (INDEPENDENT_AMBULATORY_CARE_PROVIDER_SITE_OTHER): Payer: Medicare Other | Admitting: Adult Health

## 2019-05-28 ENCOUNTER — Telehealth: Payer: Self-pay | Admitting: *Deleted

## 2019-05-28 DIAGNOSIS — E785 Hyperlipidemia, unspecified: Secondary | ICD-10-CM

## 2019-05-28 DIAGNOSIS — I200 Unstable angina: Secondary | ICD-10-CM

## 2019-05-28 DIAGNOSIS — I2510 Atherosclerotic heart disease of native coronary artery without angina pectoris: Secondary | ICD-10-CM

## 2019-05-28 DIAGNOSIS — I10 Essential (primary) hypertension: Secondary | ICD-10-CM

## 2019-05-28 DIAGNOSIS — Z9861 Coronary angioplasty status: Secondary | ICD-10-CM

## 2019-05-28 NOTE — Telephone Encounter (Signed)
Spoke with patient and he will have telephone visit with Arnold Long DNP today  Unable to do video secondary to poor service where he lives  Patient aware he will receive the best possible care via telephone visit and insurance will be billed. Verbal consent for visit given

## 2019-05-28 NOTE — Progress Notes (Signed)
Error - see separate note.

## 2019-05-28 NOTE — H&P (View-Only) (Signed)
Error - see separate note.

## 2019-05-28 NOTE — Telephone Encounter (Addendum)
Pt contacted pre-catheterization scheduled at [facility] for: Wednesday May 30, 2019 10 AM Verified arrival time and place: [place] Entrance A at: 8 AM  Covid-19 test date: 05/25/19 WL not detected  No solid food after midnight prior to cath, clear liquids until 5 AM day of procedure. Contrast allergy: no  Hold: Xarelto-last dose 05/27/19 until post procedure Insulin-AM of procedure. 1/2 Insulin PM prior to procedure. Metformin-day of procedure and 48 hours post procedure. Lisinopril-HCT -AM of procedure.  Except hold medications AM meds can be  taken pre-cath with sip of water including: ASA 81 mg Clopidogrel 75 mg   Confirmed patient has responsible person to drive home post procedure and observe 24 hours after arriving home: yes  Due to Covid-19 pandemic no visitors are allowed in the hospital (unless cognitive impairment).  Their designated party will be called when their procedure is over for an update and to arrange pick up.  Patients are required to wear a mask when they enter the hospital.  I reviewed procedure, mask, visitor restrictions with patient, he verbalized understanding.

## 2019-05-28 NOTE — Patient Instructions (Signed)
   Joseph [name]  05/28/2019  You are scheduled for a Cardiac Catheterization on Wednesday, June 10 with Dr. Mallie Mussel Smith.--->>>YOU MAY Leon YOU WITH POST-OP INSTRUCTIONS AND DRIVING.  1. Please arrive at the [facility] (Main Entrance A) at [facility]: [address] [place], [place] [address] at 8:00 AM (This time is two hours before your procedure to ensure your preparation). Free valet parking service is available.   Special note: Every effort is made to have your procedure done on time. Please understand that emergencies sometimes delay scheduled procedures.  2. Diet: Do not eat solid foods after midnight.  The patient may have clear liquids until 5am upon the day of the procedure.  3. Labs: already done  4. Medication instructions in preparation for your procedure:   Contrast Allergy: No  Stop taking Xarelto (Rivaroxaban) on Monday, June 8. HOLD UNTIL THE MORNING OF THE CATH  Stop taking, lisinopril-hctz Tuesday, June 9,, glucophage Tuesday, June 9,   On the morning of your procedure, take your Aspirin and any morning medicines NOT listed above.  You may use sips of water.  5. Plan for one night stay--bring personal belongings. 6. Bring a current list of your medications and current insurance cards. 7. You MUST have a responsible person to drive you home. 8. Someone MUST be with you the first 24 hours after you arrive home or your discharge will be delayed. 9. Please wear clothes that are easy to get on and off and wear slip-on shoes.  Thank you for allowing [place] to care for you!   -- [place] [facility]

## 2019-05-28 NOTE — Telephone Encounter (Signed)
Patient scheduled for cardiac cath Wednesday and needs virtual visit today. Patient shceduled at 1:45 with Arnold Long DNP Left message to call back

## 2019-05-28 NOTE — Progress Notes (Signed)
Virtual Visit via Telephone Note   This visit type was conducted due to national recommendations for restrictions regarding the COVID-19 Pandemic (e.g. social distancing) in an effort to limit this patient's exposure and mitigate transmission in our community.  Due to his co-morbid illnesses, this patient is at least at moderate risk for complications without adequate follow up.  This format is felt to be most appropriate for this patient at this time.  The patient did not have access to video technology/had technical difficulties with video requiring transitioning to audio format only (telephone).  All issues noted in this document were discussed and addressed.  No physical exam could be performed with this format.  Please refer to the patient's chart for his  consent to telehealth for [facility].   Date:  05/28/2019   ID:  Joseph [name], DOB 1971/07/16, MRN [id number]  Patient Location: Other:  Work Provider Location: Office  PCP:  Marva PandaMillsaps, Kimberly, NP  Cardiologist:  Chilton Siiffany [place], MD  Electrophysiologist:  None   Evaluation Performed:  Follow-Up Visit  Chief Complaint:  Abnormal Stress Test   History of Present Illness:    Joseph [name] is a 48 y.o. male who presents for  recurrent chest pain.He has a history of normal coronaries in 2006.  He has multiple other medical problems including insulin-dependent diabetes, hypertension, dyslipidemia, sleep apnea, chronic pain, prior pulmonary embolism in 2014 on chronic Xarelto, smoking, obesity, at one point he was over 600 pounds.  In 2017 he presented with chest pain.  Catheterization was done and he had an 80% distal circumflex.  This was treated with a bare-metal stent.  Due to recurrent chest pain he was scheduled for stress test and Echo. The stress test was found to be abnormal. This was discussed with Joseph [name], by Dr. Herbie BaltimoreHarding, with recommendations to proceed with cardiac cath.    He states he continues to have chest  pain but does not take NTG due to the extreme headaches he experienced. He has had his labs for pre-cath follow up. COVID Negative. WBC are slightly elevated 12.7, potassium is 5.4. with glucose of 136.   He is concerned that he will need to have CABG as on last cath he had a significant blockage but was treated with PCI. He stopped his Xarelto on 05/27/2019 in anticipation of planned cath on 05/30/2019. He unfortunately continues to smoke.   The patient does not have symptoms concerning for COVID-19 infection (fever, chills, cough, or new shortness of breath).    Past Medical History:  Diagnosis Date  . Anxiety   . Arthritis    "knees, shoulders, hips, ankles" (07/29/2016)  . Asthma   . CAD (coronary artery disease)   . Chronic bronchitis (HCC)   . Chronic ulcer of right great toe (HCC) 09/19/2017  . COPD (chronic obstructive pulmonary disease) (HCC)   . Depression   . DVT (deep venous thrombosis) (HCC)   . GERD (gastroesophageal reflux disease)   . Headache    "about 3 times/month" (07/29/2016)  . Hyperlipidemia   . Hypertension   . Myocardial infarction (HCC) 1996   "light one"  . PE (pulmonary embolism) 04/2013  . Peripheral nerve disease   . Pneumonia "several times"  . Type II diabetes mellitus (HCC)    Past Surgical History:  Procedure Laterality Date  . AMPUTATION Right 09/21/2017   Procedure: RIGHT GREAT TOE AMPUTATION, POSSIBLE VAC;  Surgeon: Joseph [name], Joseph M, MD;  Location: [facility] OR;  Service: Orthopedics;  Laterality:  Right;  Marland Kitchen CARDIAC CATHETERIZATION  2006  . CARDIAC CATHETERIZATION  1996   "@ [facility]; when I had my heart attack"  . CARDIAC CATHETERIZATION N/A 07/29/2016   Procedure: Left Heart Cath and Coronary Angiography;  Surgeon: Joseph Harp, MD;  Location: [facility] CV LAB;  Service: Cardiovascular;  Laterality: N/A;  . CARDIAC CATHETERIZATION N/A 07/29/2016   Procedure: Coronary Stent Intervention;  Surgeon: Joseph Harp, MD;  Location: [place] CV LAB;   Service: Cardiovascular;  Laterality: N/A;  . CARPAL TUNNEL RELEASE Bilateral   . CORONARY ANGIOPLASTY WITH STENT PLACEMENT  07/29/2016  . ESOPHAGOGASTRODUODENOSCOPY N/A 09/22/2017   Procedure: ESOPHAGOGASTRODUODENOSCOPY (EGD);  Surgeon: Joseph Artist, MD;  Location: [facility] ENDOSCOPY;  Service: Endoscopy;  Laterality: N/A;  . KNEE ARTHROSCOPY Bilateral    "2 on left; 1 on the right"  . SHOULDER OPEN ROTATOR CUFF REPAIR Bilateral      No outpatient medications have been marked as taking for the 05/28/19 encounter (Telemedicine) with Joseph Colonel, NP.     Allergies:   Sulfa antibiotics   Social History   Tobacco Use  . Smoking status: Current Every Day Smoker    Packs/day: 1.00    Years: 34.00    Pack years: 34.00    Types: Cigarettes  . Smokeless tobacco: Former Systems developer    Types: Snuff, Chew  . Tobacco comment: 8/910/2017 "3 ppd before 2015"  Substance Use Topics  . Alcohol use: Yes    Comment: 07/29/2016 "quit drinking in 2004"  . Drug use: No     Family Hx: The patient's family history includes Diabetes in an other family member; Hyperlipidemia in an other family member; Hypertension in his brother and father.  ROS:   Please see the history of present illness.    All other systems reviewed and are negative.   Prior CV studies:   The following studies were reviewed today:  Cardiac Cath 07/29/2016  Dist Cx lesion, 80 %stenosed.  Post intervention, there is a 0% residual stenosis.  A stent was successfully placed.  Echocardiogram 07/30/2016 Left ventricle: The cavity size was normal. Wall thickness was   increased in a pattern of mild LVH. Systolic function was normal.   The estimated ejection fraction was in the range of 55% to 60%.   Wall motion was normal; there were no regional wall motion   abnormalities. Doppler parameters are consistent with abnormal   left ventricular relaxation (grade 1 diastolic dysfunction). - Aortic root: The aortic root was mildly  dilated.  NM Stress Test 05/23/2019  The left ventricular ejection fraction is moderately decreased (30-44%).  Nuclear stress EF: 44%. Mild diffuse hypokinesis. Left ventricular hypertrophy pattern noted.  This is a intermediate risk study based upon reduced EF. No ischemia.  No evidence of circumflex ischemia (prior PCI).  Labs/Other Tests and Data Reviewed:    EKG:  No ECG reviewed.  Recent Labs: 05/25/2019: BUN 18; Creatinine, Ser 1.22; Hemoglobin 14.9; Platelets 285; Potassium 5.4; Sodium 142   Recent Lipid Panel Lab Results  Component Value Date/Time   CHOL 179 09/29/2016 11:34 AM   TRIG 300 (H) 09/29/2016 11:34 AM   HDL 27 (L) 09/29/2016 11:34 AM   CHOLHDL 6.6 (H) 09/29/2016 11:34 AM   LDLCALC 92 09/29/2016 11:34 AM   LDLDIRECT 120.0 05/28/2015 09:51 AM    Wt Readings from Last 3 Encounters:  05/23/19 (!) 327 lb (148.3 kg)  03/08/19 (!) 327 lb (148.3 kg)  01/20/19 (!) 315 lb (142.9 kg)  Objective:    Vital Signs:  There were no vitals taken for this visit.   Telephone visit severely limits physical assessment.  AAO Respirations are normal without dyspnea when speaking to me. No coughing or wheezing auscultated via the phone converstation He is understanding of explanations and discussion of his medications.   ASSESSMENT & PLAN:    1.CAD: Hx of stent to the Cx in 2017. He has had recurrent chest pain with associated fatigue. Stress test was completed on 05/23/2019 which was an intermediate risk. EF of 44% with mild diffuse hypokinesis. This was discussed by Dr. Herbie BaltimoreHarding to Joseph ShelterLuke Kilroy, PA with recommendations for cardiac cath.   The patient understands that risks include but are not limited to stroke (1 in 1000), death (1 in 1000), kidney failure [usually temporary] (1 in 500), bleeding (1 in 200), allergic reaction [possibly serious] (1 in 200), and agrees to proceed.   He will stop Xarelto 48 hours prior to the cath, he will not take Lisinopril HCTZ and  Glucophage 24 hours prior to cath. This has been explained to him and instructions are placed in the AVS.   2. Hypercholesterolemia:Continue statin therapy.   3. IDDM: Followed by PCP  4. Hypertension: BP is currently controlled per prior notes. To be assess on pre-cath evaluation vitals.   His wife Herbert SetaHeather Geving is to be allowed to be with the patient in pre-cath area due to patient issues with memory and difficulty with instructions.   COVID-19 Education: The signs and symptoms of COVID-19 were discussed with the patient and how to seek care for testing (follow up with PCP or arrange E-visit).  The importance of social distancing was discussed today.  Time:   Today, I have spent 20 minutes with the patient with telehealth technology discussing the above problems.  He has issues with memory and I had to repeat instructions several times.    Medication Adjustments/Labs and Tests Ordered: Current medicines are reviewed at length with the patient today.  Concerns regarding medicines are outlined above.   Tests Ordered: None  Medication Changes: Holding Xarelto, Lisinopril HCTZ, and Glucophage as above.   Disposition:  Follow up post cath.   Signed, Bettey MareKathryn [name]. Liborio NixonLawrence DNP, ANP, AACC  05/28/2019 3:41 PM    [facility]

## 2019-05-30 ENCOUNTER — Other Ambulatory Visit: Payer: Self-pay

## 2019-05-30 ENCOUNTER — Ambulatory Visit (HOSPITAL_COMMUNITY)
Admission: RE | Admit: 2019-05-30 | Discharge: 2019-05-31 | Disposition: A | Discharge status: Home / Self Care | Payer: Medicare Other | Attending: Interventional Cardiology | Admitting: Interventional Cardiology

## 2019-05-30 ENCOUNTER — Encounter (HOSPITAL_COMMUNITY): Payer: Self-pay | Admitting: Interventional Cardiology

## 2019-05-30 ENCOUNTER — Encounter (HOSPITAL_COMMUNITY)
Admission: RE | Disposition: A | Discharge status: Home / Self Care | Payer: Self-pay | Source: Home / Self Care | Attending: Interventional Cardiology

## 2019-05-30 DIAGNOSIS — Z882 Allergy status to sulfonamides status: Secondary | ICD-10-CM | POA: Insufficient documentation

## 2019-05-30 DIAGNOSIS — Z79899 Other long term (current) drug therapy: Secondary | ICD-10-CM | POA: Diagnosis not present

## 2019-05-30 DIAGNOSIS — Z86711 Personal history of pulmonary embolism: Secondary | ICD-10-CM | POA: Diagnosis not present

## 2019-05-30 DIAGNOSIS — Z833 Family history of diabetes mellitus: Secondary | ICD-10-CM | POA: Insufficient documentation

## 2019-05-30 DIAGNOSIS — K219 Gastro-esophageal reflux disease without esophagitis: Secondary | ICD-10-CM | POA: Diagnosis not present

## 2019-05-30 DIAGNOSIS — Z6841 Body Mass Index (BMI) 40.0 and over, adult: Secondary | ICD-10-CM | POA: Insufficient documentation

## 2019-05-30 DIAGNOSIS — I2510 Atherosclerotic heart disease of native coronary artery without angina pectoris: Secondary | ICD-10-CM

## 2019-05-30 DIAGNOSIS — E785 Hyperlipidemia, unspecified: Secondary | ICD-10-CM | POA: Insufficient documentation

## 2019-05-30 DIAGNOSIS — F17210 Nicotine dependence, cigarettes, uncomplicated: Secondary | ICD-10-CM | POA: Insufficient documentation

## 2019-05-30 DIAGNOSIS — Z8249 Family history of ischemic heart disease and other diseases of the circulatory system: Secondary | ICD-10-CM | POA: Diagnosis not present

## 2019-05-30 DIAGNOSIS — I10 Essential (primary) hypertension: Secondary | ICD-10-CM | POA: Insufficient documentation

## 2019-05-30 DIAGNOSIS — D72829 Elevated white blood cell count, unspecified: Secondary | ICD-10-CM | POA: Diagnosis present

## 2019-05-30 DIAGNOSIS — J449 Chronic obstructive pulmonary disease, unspecified: Secondary | ICD-10-CM | POA: Diagnosis not present

## 2019-05-30 DIAGNOSIS — Z794 Long term (current) use of insulin: Secondary | ICD-10-CM | POA: Insufficient documentation

## 2019-05-30 DIAGNOSIS — M1990 Unspecified osteoarthritis, unspecified site: Secondary | ICD-10-CM | POA: Insufficient documentation

## 2019-05-30 DIAGNOSIS — E119 Type 2 diabetes mellitus without complications: Secondary | ICD-10-CM | POA: Diagnosis not present

## 2019-05-30 DIAGNOSIS — M549 Dorsalgia, unspecified: Secondary | ICD-10-CM | POA: Insufficient documentation

## 2019-05-30 DIAGNOSIS — Z7901 Long term (current) use of anticoagulants: Secondary | ICD-10-CM | POA: Diagnosis not present

## 2019-05-30 DIAGNOSIS — I272 Pulmonary hypertension, unspecified: Secondary | ICD-10-CM | POA: Diagnosis not present

## 2019-05-30 DIAGNOSIS — G8929 Other chronic pain: Secondary | ICD-10-CM | POA: Diagnosis not present

## 2019-05-30 DIAGNOSIS — Z955 Presence of coronary angioplasty implant and graft: Secondary | ICD-10-CM | POA: Insufficient documentation

## 2019-05-30 DIAGNOSIS — I4510 Unspecified right bundle-branch block: Secondary | ICD-10-CM | POA: Insufficient documentation

## 2019-05-30 DIAGNOSIS — Z89411 Acquired absence of right great toe: Secondary | ICD-10-CM | POA: Insufficient documentation

## 2019-05-30 DIAGNOSIS — I252 Old myocardial infarction: Secondary | ICD-10-CM | POA: Insufficient documentation

## 2019-05-30 DIAGNOSIS — R079 Chest pain, unspecified: Secondary | ICD-10-CM | POA: Diagnosis present

## 2019-05-30 DIAGNOSIS — I25118 Atherosclerotic heart disease of native coronary artery with other forms of angina pectoris: Secondary | ICD-10-CM | POA: Insufficient documentation

## 2019-05-30 DIAGNOSIS — G4733 Obstructive sleep apnea (adult) (pediatric): Secondary | ICD-10-CM | POA: Insufficient documentation

## 2019-05-30 DIAGNOSIS — E669 Obesity, unspecified: Secondary | ICD-10-CM | POA: Diagnosis not present

## 2019-05-30 DIAGNOSIS — Z7902 Long term (current) use of antithrombotics/antiplatelets: Secondary | ICD-10-CM | POA: Insufficient documentation

## 2019-05-30 HISTORY — PX: RIGHT/LEFT HEART CATH AND CORONARY ANGIOGRAPHY: CATH118266

## 2019-05-30 HISTORY — PX: CORONARY STENT INTERVENTION: CATH118234

## 2019-05-30 LAB — POCT I-STAT EG7
Acid-base deficit: 2 mmol/L (ref 0.0–2.0)
Bicarbonate: 23.9 mmol/L (ref 20.0–28.0)
Calcium, Ion: 1.29 mmol/L (ref 1.15–1.40)
HCT: 42 % (ref 39.0–52.0)
Hemoglobin: 14.3 g/dL (ref 13.0–17.0)
O2 Saturation: 71 %
Potassium: 4.5 mmol/L (ref 3.5–5.1)
Sodium: 145 mmol/L (ref 135–145)
TCO2: 25 mmol/L (ref 22–32)
pCO2, Ven: 42.5 mmHg — ABNORMAL LOW (ref 44.0–60.0)
pH, Ven: 7.359 (ref 7.250–7.430)
pO2, Ven: 39 mmHg (ref 32.0–45.0)

## 2019-05-30 LAB — GLUCOSE, CAPILLARY
Glucose-Capillary: 88 mg/dL (ref 70–99)
Glucose-Capillary: 68 mg/dL — ABNORMAL LOW (ref 70–99)
Glucose-Capillary: 196 mg/dL — ABNORMAL HIGH (ref 70–99)
Glucose-Capillary: 137 mg/dL — ABNORMAL HIGH (ref 70–99)
Glucose-Capillary: 130 mg/dL — ABNORMAL HIGH (ref 70–99)

## 2019-05-30 LAB — POCT I-STAT 7, (LYTES, BLD GAS, ICA,H+H)
Acid-base deficit: 2 mmol/L (ref 0.0–2.0)
Bicarbonate: 22.9 mmol/L (ref 20.0–28.0)
Calcium, Ion: 1.31 mmol/L (ref 1.15–1.40)
HCT: 42 % (ref 39.0–52.0)
Hemoglobin: 14.3 g/dL (ref 13.0–17.0)
O2 Saturation: 95 %
Potassium: 4.6 mmol/L (ref 3.5–5.1)
Sodium: 144 mmol/L (ref 135–145)
TCO2: 24 mmol/L (ref 22–32)
pCO2 arterial: 40.8 mmHg (ref 32.0–48.0)
pH, Arterial: 7.357 (ref 7.350–7.450)
pO2, Arterial: 81 mmHg — ABNORMAL LOW (ref 83.0–108.0)

## 2019-05-30 LAB — POCT ACTIVATED CLOTTING TIME
Activated Clotting Time: 235 seconds
Activated Clotting Time: 235 seconds
Activated Clotting Time: 268 seconds

## 2019-05-30 SURGERY — RIGHT/LEFT HEART CATH AND CORONARY ANGIOGRAPHY
Anesthesia: LOCAL

## 2019-05-30 MED ORDER — FLUTICASONE PROPIONATE 50 MCG/ACT NA SUSP: 2.0000 | Freq: Every day | NASAL | Status: DC | PRN

## 2019-05-30 MED ORDER — HEPARIN SODIUM (PORCINE) 1000 UNIT/ML IJ SOLN
INTRAMUSCULAR | Status: DC | PRN
  Administered 2019-05-30: 3000 [IU] via INTRAVENOUS
  Administered 2019-05-30: 4000 [IU] via INTRAVENOUS
  Administered 2019-05-30: 3000 [IU] via INTRAVENOUS
  Administered 2019-05-30 (×2): 7500 [IU] via INTRAVENOUS

## 2019-05-30 MED ORDER — MECLIZINE HCL 25 MG PO TABS
50.0000 mg | ORAL_TABLET | Freq: Every morning | ORAL | Status: DC
  Administered 2019-05-31: 50 mg via ORAL
  Filled 2019-05-30: qty 2

## 2019-05-30 MED ORDER — CLOPIDOGREL BISULFATE 300 MG PO TABS
ORAL_TABLET | ORAL | Status: DC | PRN
  Administered 2019-05-30: 300 mg via ORAL

## 2019-05-30 MED ORDER — PREGABALIN 100 MG PO CAPS: 100.0000 mg | ORAL_CAPSULE | ORAL | Status: DC

## 2019-05-30 MED ORDER — HEPARIN SODIUM (PORCINE) 1000 UNIT/ML IJ SOLN
INTRAMUSCULAR | Status: AC
  Filled 2019-05-30: qty 1

## 2019-05-30 MED ORDER — HEPARIN (PORCINE) IN NACL 1000-0.9 UT/500ML-% IV SOLN
INTRAVENOUS | Status: AC
  Filled 2019-05-30: qty 1000

## 2019-05-30 MED ORDER — LIDOCAINE HCL (PF) 1 % IJ SOLN
INTRAMUSCULAR | Status: DC | PRN
  Administered 2019-05-30: 5 mL
  Administered 2019-05-30: 10 mL

## 2019-05-30 MED ORDER — FENTANYL CITRATE (PF) 100 MCG/2ML IJ SOLN
INTRAMUSCULAR | Status: AC
  Filled 2019-05-30: qty 2

## 2019-05-30 MED ORDER — ALBUTEROL SULFATE (2.5 MG/3ML) 0.083% IN NEBU: 2.5000 mg | INHALATION_SOLUTION | Freq: Four times a day (QID) | RESPIRATORY_TRACT | Status: DC | PRN

## 2019-05-30 MED ORDER — FENTANYL CITRATE (PF) 100 MCG/2ML IJ SOLN
INTRAMUSCULAR | Status: DC | PRN
  Administered 2019-05-30: 25 ug via INTRAVENOUS
  Administered 2019-05-30 (×2): 50 ug via INTRAVENOUS

## 2019-05-30 MED ORDER — PREGABALIN 50 MG PO CAPS
100.0000 mg | ORAL_CAPSULE | Freq: Every day | ORAL | Status: DC
  Administered 2019-05-30: 100 mg via ORAL
  Filled 2019-05-30: qty 2

## 2019-05-30 MED ORDER — LABETALOL HCL 5 MG/ML IV SOLN: 10.0000 mg | INTRAVENOUS | Status: AC | PRN

## 2019-05-30 MED ORDER — SODIUM CHLORIDE 0.9% FLUSH
3.0000 mL | Freq: Two times a day (BID) | INTRAVENOUS | Status: DC
  Administered 2019-05-30: 3 mL via INTRAVENOUS

## 2019-05-30 MED ORDER — MIDAZOLAM HCL 2 MG/2ML IJ SOLN
INTRAMUSCULAR | Status: DC | PRN
  Administered 2019-05-30 (×3): 1 mg via INTRAVENOUS

## 2019-05-30 MED ORDER — CARVEDILOL 3.125 MG PO TABS
3.1250 mg | ORAL_TABLET | Freq: Two times a day (BID) | ORAL | Status: DC
  Administered 2019-05-30: 3.125 mg via ORAL
  Filled 2019-05-30: qty 1

## 2019-05-30 MED ORDER — ONDANSETRON HCL 4 MG/2ML IJ SOLN: 4.0000 mg | Freq: Four times a day (QID) | INTRAMUSCULAR | Status: DC | PRN

## 2019-05-30 MED ORDER — MIDAZOLAM HCL 2 MG/2ML IJ SOLN
INTRAMUSCULAR | Status: AC
  Filled 2019-05-30: qty 2

## 2019-05-30 MED ORDER — ALPRAZOLAM 0.25 MG PO TABS
1.0000 mg | ORAL_TABLET | Freq: Four times a day (QID) | ORAL | Status: DC | PRN
  Administered 2019-05-31: 1 mg via ORAL
  Filled 2019-05-30: qty 4

## 2019-05-30 MED ORDER — SODIUM CHLORIDE 0.9% FLUSH: 3.0000 mL | INTRAVENOUS | Status: DC | PRN

## 2019-05-30 MED ORDER — LIRAGLUTIDE 18 MG/3ML ~~LOC~~ SOPN: 1.2000 mg | PEN_INJECTOR | Freq: Every day | SUBCUTANEOUS | Status: DC

## 2019-05-30 MED ORDER — HEPARIN (PORCINE) IN NACL 1000-0.9 UT/500ML-% IV SOLN
INTRAVENOUS | Status: DC | PRN
  Administered 2019-05-30 (×2): 500 mL

## 2019-05-30 MED ORDER — ACETAMINOPHEN 325 MG PO TABS: 650.0000 mg | ORAL_TABLET | ORAL | Status: DC | PRN

## 2019-05-30 MED ORDER — ATORVASTATIN CALCIUM 40 MG PO TABS
40.0000 mg | ORAL_TABLET | Freq: Every day | ORAL | Status: DC
  Administered 2019-05-30: 40 mg via ORAL
  Filled 2019-05-30: qty 1

## 2019-05-30 MED ORDER — VERAPAMIL HCL 2.5 MG/ML IV SOLN
INTRAVENOUS | Status: DC | PRN
  Administered 2019-05-30: 10 mL via INTRA_ARTERIAL

## 2019-05-30 MED ORDER — HYDRALAZINE HCL 20 MG/ML IJ SOLN: 10.0000 mg | INTRAMUSCULAR | Status: AC | PRN

## 2019-05-30 MED ORDER — INSULIN GLARGINE 100 UNIT/ML ~~LOC~~ SOLN: 70.0000 [IU] | Freq: Two times a day (BID) | SUBCUTANEOUS | Status: DC

## 2019-05-30 MED ORDER — ASPIRIN 81 MG PO CHEW
81.0000 mg | CHEWABLE_TABLET | Freq: Every day | ORAL | Status: DC
  Administered 2019-05-31: 81 mg via ORAL
  Filled 2019-05-30: qty 1

## 2019-05-30 MED ORDER — SODIUM CHLORIDE 0.9 % IV SOLN: INTRAVENOUS | Status: AC

## 2019-05-30 MED ORDER — RIVAROXABAN 20 MG PO TABS: 20.0000 mg | ORAL_TABLET | Freq: Every day | ORAL | Status: DC

## 2019-05-30 MED ORDER — INSULIN GLARGINE 100 UNIT/ML SOLOSTAR PEN: 70.0000 [IU] | PEN_INJECTOR | Freq: Two times a day (BID) | SUBCUTANEOUS | Status: DC

## 2019-05-30 MED ORDER — MECLIZINE HCL 25 MG PO TABS
25.0000 mg | ORAL_TABLET | Freq: Every evening | ORAL | Status: DC
  Administered 2019-05-30: 25 mg via ORAL
  Filled 2019-05-30: qty 1

## 2019-05-30 MED ORDER — NITROGLYCERIN 0.4 MG SL SUBL: 0.4000 mg | SUBLINGUAL_TABLET | SUBLINGUAL | Status: DC | PRN

## 2019-05-30 MED ORDER — INSULIN ASPART 100 UNIT/ML ~~LOC~~ SOLN
0.0000 [IU] | Freq: Three times a day (TID) | SUBCUTANEOUS | Status: DC
  Administered 2019-05-30: 2 [IU] via SUBCUTANEOUS

## 2019-05-30 MED ORDER — FLUCONAZOLE 100 MG PO TABS: 100.0000 mg | ORAL_TABLET | ORAL | Status: DC

## 2019-05-30 MED ORDER — METFORMIN HCL 500 MG PO TABS
1000.0000 mg | ORAL_TABLET | Freq: Two times a day (BID) | ORAL | Status: DC
  Filled 2019-05-30: qty 2

## 2019-05-30 MED ORDER — SODIUM CHLORIDE 0.9% FLUSH: 3.0000 mL | Freq: Two times a day (BID) | INTRAVENOUS | Status: DC

## 2019-05-30 MED ORDER — CLOPIDOGREL BISULFATE 75 MG PO TABS
75.0000 mg | ORAL_TABLET | Freq: Every day | ORAL | Status: DC
  Administered 2019-05-31: 75 mg via ORAL
  Filled 2019-05-30: qty 1

## 2019-05-30 MED ORDER — OXYCODONE HCL 5 MG PO TABS
5.0000 mg | ORAL_TABLET | ORAL | Status: DC | PRN
  Administered 2019-05-30 – 2019-05-31 (×4): 10 mg via ORAL
  Filled 2019-05-30 (×4): qty 2

## 2019-05-30 MED ORDER — INSULIN LISPRO (1 UNIT DIAL) 100 UNIT/ML (KWIKPEN): 20.0000 [IU] | PEN_INJECTOR | Freq: Three times a day (TID) | SUBCUTANEOUS | Status: DC

## 2019-05-30 MED ORDER — SODIUM CHLORIDE 0.9 % IV SOLN: 250.0000 mL | INTRAVENOUS | Status: DC | PRN

## 2019-05-30 MED ORDER — NITROGLYCERIN 1 MG/10 ML FOR IR/CATH LAB
INTRA_ARTERIAL | Status: DC | PRN
  Administered 2019-05-30: 100 ug via INTRACORONARY

## 2019-05-30 MED ORDER — PANTOPRAZOLE SODIUM 40 MG PO TBEC
40.0000 mg | DELAYED_RELEASE_TABLET | Freq: Every day | ORAL | Status: DC
  Administered 2019-05-31: 40 mg via ORAL
  Filled 2019-05-30: qty 1

## 2019-05-30 MED ORDER — VERAPAMIL HCL 2.5 MG/ML IV SOLN
INTRAVENOUS | Status: AC
  Filled 2019-05-30: qty 2

## 2019-05-30 MED ORDER — LIDOCAINE HCL (PF) 1 % IJ SOLN
INTRAMUSCULAR | Status: AC
  Filled 2019-05-30: qty 30

## 2019-05-30 MED ORDER — BUPROPION HCL ER (SR) 150 MG PO TB12
150.0000 mg | ORAL_TABLET | Freq: Two times a day (BID) | ORAL | Status: DC
  Administered 2019-05-30 – 2019-05-31 (×2): 150 mg via ORAL
  Filled 2019-05-30 (×2): qty 1

## 2019-05-30 MED ORDER — OXYCODONE HCL 5 MG PO TABS: 15.0000 mg | ORAL_TABLET | Freq: Every day | ORAL | Status: DC

## 2019-05-30 MED ORDER — LISINOPRIL 10 MG PO TABS
5.0000 mg | ORAL_TABLET | Freq: Every day | ORAL | Status: DC
  Administered 2019-05-31: 5 mg via ORAL
  Filled 2019-05-30: qty 1

## 2019-05-30 MED ORDER — IOHEXOL 350 MG/ML SOLN
INTRAVENOUS | Status: DC | PRN
  Administered 2019-05-30: 170 mL via INTRAVENOUS

## 2019-05-30 MED ORDER — CLOPIDOGREL BISULFATE 300 MG PO TABS
ORAL_TABLET | ORAL | Status: AC
  Filled 2019-05-30: qty 1

## 2019-05-30 MED ORDER — NITROGLYCERIN 1 MG/10 ML FOR IR/CATH LAB
INTRA_ARTERIAL | Status: AC
  Filled 2019-05-30: qty 10

## 2019-05-30 MED ORDER — AMLODIPINE BESYLATE 5 MG PO TABS
2.5000 mg | ORAL_TABLET | Freq: Every day | ORAL | Status: DC
  Administered 2019-05-30 – 2019-05-31 (×2): 2.5 mg via ORAL
  Filled 2019-05-30 (×2): qty 1

## 2019-05-30 MED ORDER — PREGABALIN 75 MG PO CAPS
200.0000 mg | ORAL_CAPSULE | Freq: Every morning | ORAL | Status: DC
  Administered 2019-05-31: 200 mg via ORAL
  Filled 2019-05-30: qty 1

## 2019-05-30 MED ORDER — ASPIRIN 81 MG PO CHEW
81.0000 mg | CHEWABLE_TABLET | ORAL | Status: AC
  Administered 2019-05-30: 81 mg via ORAL
  Filled 2019-05-30: qty 1

## 2019-05-30 MED ORDER — PROMETHAZINE HCL 25 MG PO TABS: 50.0000 mg | ORAL_TABLET | Freq: Four times a day (QID) | ORAL | Status: DC | PRN

## 2019-05-30 MED ORDER — SODIUM CHLORIDE 0.9 % IV SOLN
INTRAVENOUS | Status: DC
  Administered 2019-05-30: 09:00:00 via INTRAVENOUS

## 2019-05-30 SURGICAL SUPPLY — 19 items
BALLN SAPPHIRE 2.75X20 (BALLOONS) ×2
BALLN SAPPHIRE ~~LOC~~ 3.0X15 (BALLOONS) ×1 IMPLANT
BALLOON SAPPHIRE 2.75X20 (BALLOONS) IMPLANT
CATH 5FR JL3.5 JR4 ANG PIG MP (CATHETERS) ×1 IMPLANT
CATH BALLN WEDGE 5F 110CM (CATHETERS) ×1 IMPLANT
CATH VISTA GUIDE 6FR JR4 (CATHETERS) ×1 IMPLANT
DEVICE RAD COMP TR BAND LRG (VASCULAR PRODUCTS) ×1 IMPLANT
GLIDESHEATH SLEND A-KIT 6F 22G (SHEATH) ×1 IMPLANT
GUIDEWIRE .025 260CM (WIRE) ×1 IMPLANT
GUIDEWIRE INQWIRE 1.5J.035X260 (WIRE) IMPLANT
INQWIRE 1.5J .035X260CM (WIRE) ×2
KIT ENCORE 26 ADVANTAGE (KITS) ×1 IMPLANT
KIT HEART LEFT (KITS) ×2 IMPLANT
PACK CARDIAC CATHETERIZATION (CUSTOM PROCEDURE TRAY) ×2 IMPLANT
SHEATH GLIDE SLENDER 4/5FR (SHEATH) ×1 IMPLANT
STENT RESOLUTE ONYX 2.75X30 (Permanent Stent) ×1 IMPLANT
TRANSDUCER W/STOPCOCK (MISCELLANEOUS) ×2 IMPLANT
TUBING CIL FLEX 10 FLL-RA (TUBING) ×2 IMPLANT
WIRE ASAHI PROWATER 180CM (WIRE) ×1 IMPLANT

## 2019-05-30 NOTE — CV Procedure (Signed)
   Referral for typical and atypical chest discomfort increasing recently and Buriev episodes.  Diagnostic right and left heart cath from the right antecubital vein and radial artery respectively.  Real-time vascular ultrasound used.  30% proximal left main  Irregularities in the LAD  Distal circumflex stent is patent and for the distal and the obtuse marginal there is 90% segmental stenosis.  Circumflex arises at an acute angle from the left main.  Mid to distal RCA with segmental 85% stenosis.  Proximal and mid vessel contains luminal irregularities up to 30%.  30 x 2.75 Onyx postdilated to 3.0 mm in diameter reducing the right coronary stenosis to 0% with TIMI grade III flow.  He was given an additional 300 mg of Plavix prior to PCI.  ACT ranged between 230 and 270 during the procedure.  A total of 22,000 units of heparin was administered.  Plan discharge in a.m.  Resume Xarelto later this evening or in a.m.

## 2019-05-30 NOTE — Interval H&P Note (Signed)
Cath Lab Visit (complete for each Cath Lab visit)  Clinical Evaluation Leading to the Procedure:   ACS: No.  Non-ACS:    Anginal Classification: CCS III  Anti-ischemic medical therapy: Minimal Therapy (1 class of medications)  Non-Invasive Test Results: Intermediate-risk stress test findings: cardiac mortality 1-3%/year  Prior CABG: No previous CABG      History and Physical Interval Note:  05/30/2019 8:08 AM  Joseph [name]  has presented today for surgery, with the diagnosis of Cardiomyopathy.  The various methods of treatment have been discussed with the patient and family. After consideration of risks, benefits and other options for treatment, the patient has consented to  Procedure(s): RIGHT/LEFT HEART CATH AND CORONARY ANGIOGRAPHY (N/A) as a surgical intervention.  The patient's history has been reviewed, patient examined, no change in status, stable for surgery.  I have reviewed the patient's chart and labs.  Questions were answered to the patient's satisfaction.     Belva Crome III

## 2019-05-30 NOTE — Progress Notes (Signed)
TR BAND REMOVAL  LOCATION:    right radial  DEFLATED PER PROTOCOL:    Yes.    TIME BAND OFF / DRESSING APPLIED:    1630   SITE UPON ARRIVAL:    Level 0  SITE AFTER BAND REMOVAL:    Level 0  CIRCULATION SENSATION AND MOVEMENT:    Within Normal Limits   Yes.    COMMENTS:    

## 2019-05-31 ENCOUNTER — Other Ambulatory Visit: Payer: Self-pay

## 2019-05-31 ENCOUNTER — Encounter (HOSPITAL_COMMUNITY): Payer: Self-pay | Admitting: Student

## 2019-05-31 ENCOUNTER — Telehealth: Payer: Self-pay | Admitting: Cardiology

## 2019-05-31 DIAGNOSIS — Z9861 Coronary angioplasty status: Secondary | ICD-10-CM | POA: Diagnosis not present

## 2019-05-31 DIAGNOSIS — Z86711 Personal history of pulmonary embolism: Secondary | ICD-10-CM

## 2019-05-31 DIAGNOSIS — I2510 Atherosclerotic heart disease of native coronary artery without angina pectoris: Secondary | ICD-10-CM

## 2019-05-31 DIAGNOSIS — I25118 Atherosclerotic heart disease of native coronary artery with other forms of angina pectoris: Secondary | ICD-10-CM | POA: Diagnosis not present

## 2019-05-31 DIAGNOSIS — D72829 Elevated white blood cell count, unspecified: Secondary | ICD-10-CM | POA: Diagnosis present

## 2019-05-31 DIAGNOSIS — I4510 Unspecified right bundle-branch block: Secondary | ICD-10-CM | POA: Diagnosis not present

## 2019-05-31 DIAGNOSIS — I10 Essential (primary) hypertension: Secondary | ICD-10-CM | POA: Diagnosis not present

## 2019-05-31 DIAGNOSIS — E785 Hyperlipidemia, unspecified: Secondary | ICD-10-CM | POA: Diagnosis not present

## 2019-05-31 LAB — BASIC METABOLIC PANEL
Anion gap: 8 (ref 5–15)
BUN: 17 mg/dL (ref 6–20)
CO2: 24 mmol/L (ref 22–32)
Calcium: 9 mg/dL (ref 8.9–10.3)
Chloride: 109 mmol/L (ref 98–111)
Creatinine, Ser: 1.15 mg/dL (ref 0.61–1.24)
GFR calc Af Amer: >60 mL/min (ref >60)
GFR calc non Af Amer: >60 mL/min (ref >60)
Glucose, Bld: 109 mg/dL — ABNORMAL HIGH (ref 70–99)
Potassium: 3.9 mmol/L (ref 3.5–5.1)
Sodium: 141 mmol/L (ref 135–145)

## 2019-05-31 LAB — CBC
HCT: 41.2 % (ref 39.0–52.0)
Hemoglobin: 13.4 g/dL (ref 13.0–17.0)
MCH: 28.5 pg (ref 26.0–34.0)
MCHC: 32.5 g/dL (ref 30.0–36.0)
MCV: 87.7 fL (ref 80.0–100.0)
Platelets: 244 10*3/uL (ref 150–400)
RBC: 4.7 MIL/uL (ref 4.22–5.81)
RDW: 13.1 % (ref 11.5–15.5)
WBC: 13.1 10*3/uL — ABNORMAL HIGH (ref 4.0–10.5)
nRBC: 0 % (ref 0.0–0.2)

## 2019-05-31 LAB — GLUCOSE, CAPILLARY: Glucose-Capillary: 97 mg/dL (ref 70–99)

## 2019-05-31 MED ORDER — ATORVASTATIN CALCIUM 80 MG PO TABS: 80.0000 mg | ORAL_TABLET | Freq: Every day | ORAL | 2 refills | Status: DC

## 2019-05-31 MED ORDER — CARVEDILOL 6.25 MG PO TABS: 6.2500 mg | ORAL_TABLET | Freq: Two times a day (BID) | ORAL | 2 refills | Status: DC

## 2019-05-31 MED ORDER — CLOPIDOGREL BISULFATE 75 MG PO TABS: 75.0000 mg | ORAL_TABLET | Freq: Every day | ORAL | 5 refills | Status: DC

## 2019-05-31 MED ORDER — ATORVASTATIN CALCIUM 80 MG PO TABS: 80.0000 mg | ORAL_TABLET | Freq: Every day | ORAL | Status: DC

## 2019-05-31 MED ORDER — CARVEDILOL 6.25 MG PO TABS
6.2500 mg | ORAL_TABLET | Freq: Two times a day (BID) | ORAL | Status: DC
  Administered 2019-05-31: 6.25 mg via ORAL
  Filled 2019-05-31: qty 1

## 2019-05-31 MED FILL — ATORVASTATIN CALCIUM 80 MG: 80 | 30 days supply | Qty: 30 | Fill #0

## 2019-05-31 MED FILL — CARVEDILOL 6.25 MG TABLET: 6.25 | 30 days supply | Qty: 60 | Fill #0

## 2019-05-31 NOTE — Discharge Instructions (Signed)
Post Cardiac Catheterization: NO HEAVY LIFTING OR SEXUAL ACTIVITY X 7 DAYS. NO DRIVING X 3-5 DAYS. NO SOAKING BATHS, HOT TUBS, POOLS, ETC., X 7 DAYS.  Radial Site Care: Refer to this sheet in the next few weeks. These instructions provide you with information on caring for yourself after your procedure. Your caregiver may also give you more specific instructions. Your treatment has been planned according to current medical practices, but problems sometimes occur. Call your caregiver if you have any problems or questions after your procedure. HOME CARE INSTRUCTIONS  You may shower the day after the procedure.Remove the bandage (dressing) and gently wash the site with plain soap and water.Gently pat the site dry.   Do not apply powder or lotion to the site.   Do not submerge the affected site in water for 3 to 5 days.   Inspect the site at least twice daily.   Do not flex or bend the affected arm for 24 hours.   No lifting over 5 pounds (2.3 kg) for 5 days after your procedure.   Do not drive home if you are discharged the same day of the procedure. Have someone else drive you.  What to expect:  Any bruising will usually fade within 1 to 2 weeks.   Blood that collects in the tissue (hematoma) may be painful to the touch. It should usually decrease in size and tenderness within 1 to 2 weeks.  SEEK IMMEDIATE MEDICAL CARE IF:  You have unusual pain at the radial site.   You have redness, warmth, swelling, or pain at the radial site.   You have drainage (other than a small amount of blood on the dressing).   You have chills.   You have a fever or persistent symptoms for more than 72 hours.   You have a fever and your symptoms suddenly get worse.   Your arm becomes pale, cool, tingly, or numb.   You have heavy bleeding from the site. Hold pressure on the site.   YOUR CARDIOLOGY TEAM HAS ARRANGED FOR AN E-VISIT FOR YOUR APPOINTMENT - PLEASE REVIEW IMPORTANT INFORMATION BELOW  SEVERAL DAYS PRIOR TO YOUR APPOINTMENT  Due to the recent COVID-19 pandemic, we are transitioning in-person office visits to tele-medicine visits in an effort to decrease unnecessary exposure to our patients, their families, and staff. These visits are billed to your insurance just like a normal visit is. We also encourage you to sign up for MyChart if you have not already done so. You will need a smartphone if possible. For patients that do not have this, we can still complete the visit using a regular telephone but do prefer a smartphone to enable video when possible. You may have a family member that lives with you that can help. If possible, we also ask that you have a blood pressure cuff and scale at home to measure your blood pressure, heart rate and weight prior to your scheduled appointment. Patients with clinical needs that need an in-person evaluation and testing will still be able to come to the office if absolutely necessary. If you have any questions, feel free to call our office.   YOUR PROVIDER WILL BE USING ONE OF THE FOLLOWING PLATFORM TO COMPLETE YOUR VISIT: Our office will call you a couple of days before your appointment with more information.   IF USING MYCHART - How to Download the MyChart App to Your SmartPhone   - If Apple, go to [employer] and type in MyChart in the  search bar and download the app. If Android, ask patient to go to [employer] and type in [place] in the search bar and download the app. The app is free but as with any other app downloads, your phone may require you to verify saved payment information or Apple/Android password.  - You will need to then log into the app with your MyChart username and password, and select [facility] as your healthcare provider to link the account.  - When it is time for your visit, go to the MyChart app, find appointments, and click Begin Video Visit. Be sure to Select Allow for your device to access the Microphone and Camera  for your visit. You will then be connected, and your provider will be with you shortly.  **If you have any issues connecting or need assistance, please contact MyChart service desk [phone])**  **If using a computer, in order to ensure the best quality for your visit, you will need to use either of the following Internet Browsers: Insurance underwriter or Microsoft Edge**   IF USING DOXIMITY or [email or web address] - The staff will give you instructions on receiving your link to join the meeting the day of your visit.    2-3 DAYS BEFORE YOUR APPOINTMENT  You will receive a telephone call from one of our [facility] team members - your caller ID may say "Unknown caller." If this is a video visit, we will walk you through how to get the video launched on your phone. We will remind you check your blood pressure, heart rate and weight prior to your scheduled appointment. If you have an Apple Watch or Kardia, please upload any pertinent ECG strips the day before or morning of your appointment to [facility]. Our staff will also make sure you have reviewed the consent and agree to move forward with your scheduled tele-health visit.     THE DAY OF YOUR APPOINTMENT  Approximately 15 minutes prior to your scheduled appointment, you will receive a telephone call from one of [facility] team - your caller ID may say "Unknown caller."  Our staff will confirm medications, vital signs for the day and any symptoms you may be experiencing. Please have this information available prior to the time of visit start. It may also be helpful for you to have a pad of paper and pen handy for any instructions given during your visit. They will also walk you through joining the smartphone meeting if this is a video visit.   CONSENT FOR TELE-HEALTH VISIT - PLEASE REVIEW  I hereby voluntarily request, consent and authorize [facility] and its employed or contracted physicians, physician assistants, nurse practitioners or other  licensed health care professionals (the Practitioner), to provide me with telemedicine health care services (the Services") as deemed necessary by the treating Practitioner. I acknowledge and consent to receive the Services by the Practitioner via telemedicine. I understand that the telemedicine visit will involve communicating with the Practitioner through live audiovisual communication technology and the disclosure of certain medical information by electronic transmission. I acknowledge that I have been given the opportunity to request an in-person assessment or other available alternative prior to the telemedicine visit and am voluntarily participating in the telemedicine visit.  I understand that I have the right to withhold or withdraw my consent to the use of telemedicine in the course of my care at any time, without affecting my right to future care or treatment, and that the Practitioner or I may terminate the telemedicine visit  at any time. I understand that I have the right to inspect all information obtained and/or recorded in the course of the telemedicine visit and may receive copies of available information for a reasonable fee.  I understand that some of the potential risks of receiving the Services via telemedicine include:   Delay or interruption in medical evaluation due to technological equipment failure or disruption;  Information transmitted may not be sufficient (e.g. poor resolution of images) to allow for appropriate medical decision making by the Practitioner; and/or   In rare instances, security protocols could fail, causing a breach of personal health information.  Furthermore, I acknowledge that it is my responsibility to provide information about my medical history, conditions and care that is complete and accurate to the best of my ability. I acknowledge that Practitioner's advice, recommendations, and/or decision may be based on factors not within their control, such as  incomplete or inaccurate data provided by me or distortions of diagnostic images or specimens that may result from electronic transmissions. I understand that the practice of medicine is not an exact science and that Practitioner makes no warranties or guarantees regarding treatment outcomes. I acknowledge that I will receive a copy of this consent concurrently upon execution via email to the email address I last provided but may also request a printed copy by calling the office of [facility].    I understand that my insurance will be billed for this visit.   I have read or had this consent read to me.  I understand the contents of this consent, which adequately explains the benefits and risks of the Services being provided via telemedicine.   I have been provided ample opportunity to ask questions regarding this consent and the Services and have had my questions answered to my satisfaction.  I give my informed consent for the services to be provided through the use of telemedicine in my medical care  By participating in this telemedicine visit I agree to the above.

## 2019-05-31 NOTE — Telephone Encounter (Signed)
NEW MESSAGE:   PT has TOC 6/24 1:45p

## 2019-05-31 NOTE — Discharge Summary (Addendum)
Discharge Summary    Patient ID: Joseph [name] MRN: [id number]; DOB: 1971/10/20  Admit date: 05/30/2019 Discharge date: 05/31/2019  Primary Care Provider: [name], NP  Primary Cardiologist: Skeet Latch, MD  Primary Electrophysiologist:  None   Discharge Diagnoses    Principal Problem:   Recurrent chest pain Active Problems:   Dyslipidemia   Benign hypertension   History of pulmonary embolus (PE)   OSA (obstructive sleep apnea)   Insulin dependent type 2 diabetes mellitus (HCC)   Chronic back pain   CAD (coronary artery disease), native coronary artery   Leukocytosis   Allergies Allergies  Allergen Reactions  . Sulfa Antibiotics Other (See Comments)    headache    Diagnostic Studies/Procedures    Right/Left Heart Catheterization 05/30/2019:  New segmental 80% mid distal RCA treated with 2.75 x 30 Onyx postdilated to 3.0 mm in diameter reducing the stenosis to 0%.  Patent left main with distal 30% narrowing.  Widely patent LAD diagonal system with luminal irregularities noted.  The circumflex has a widely patent stent in the distal vessel before ending on a small trifurcating obtuse marginal.  This very distal segment before the trifurcation has 90+ percent stenosis which is new and could potentially be a 3 region of restenosis.  Normal left ventricular function.  LVEDP 22 mmHg.  Mildly elevated pulmonary systolic pressure with mean pulmonary capillary wedge pressure of 22 mmHg.  Recommendations:  The patient will need dual therapy with Xarelto and clopidogrel.  Continue aspirin while in the hospital but would not use aspirin long-term.  Aggressive risk factor modification including smoking cessation, LDL target less than 55, consider SGLT2 therapy because the patient is very high risk.  Eligible for discharge in a.m.  Diagnostic Dominance: Right  Intervention    History of Present Illness     Joseph [name] is a 48 year old male  with a history of CAD s/p BMS to distal circumflex in 2017, hypertension, dyslipidemia, insulin-dependent diabetes, sleep apnea, prior pulmonary embolism in 2014 on chronic Xarelto, chronic pain, obesity, and tobacco use who is followed by Dr. Oval Linsey. Patient had recent stress test on 05/23/2019 for recurrent chest pain which was intermediate risk due to moderately reduced EF of 30-44% with mild diffuse hypokinesis but no ischemia was noted. Dr. Ellyn Hack revealed stress test and felt like patient need a right/left heart catheterization.   Patient was seen by Jory Sims on 05/28/2019 for a virtual visit at which time he reported continued chest pain. He had not been taking any Nitroglycerin due to extreme headaches with this. Pre-cath labs showed WBC of 12.7, K of 5.4, and Glucose of 136. COVID-19 testing negative.   Patient expressed concern that he will need to have a CABG as on last cath he had a significant blockage but was treated with PCI. He stopped his Xarelto on 05/27/2019 in anticipation of planned cath on 05/30/2019. He unfortunately continues to smoke.  The patient does not have symptoms concerning for COVID-19 infection (fever, chills, cough, or new shortness of breath).    Plan was for outpatient right/left cardiac catheterization on 05/30/2019.   Hospital Course     Consultants: None.  Recurrent Chest Pain  Patient presented for outpatient right/left heart catheterization as stated above. Study showed new segmental 80% stenosis of the mid distal RCA with widely patent stent in distal circumflex before ending on a small trifurcating obtuse marginal. This very distal segment before the trifurcation has a 90+% stenosis which is new and could  potentially be a region of restenosis. Mildly elevated pulmonary systolic pressured noted with a mean pulmonary capillary wedge pressure of 22 mmHg. Patient underwent successful PCI with DES to the RCA lesion. Patient tolerated the procedure well. Renal  function stable. Right radial cath site soft with no signs of hematoma. Patient able to ambulate without any significant problems other than chronic hip and back pain. Chest pain improved.  - Continue Clopidogrel. Per Dr. Tamala Julian, "continue Aspirin while in the hospital but would not use long-term" as patient is on chronic Xarelto for prior pulmonary embolism. Therefore, will not prescribe Aspirin at discharge.  - Continue aggressive risk factor modification including smoking cessation and LDL target less than 55. Will increase home Coreg to 6.69m twice daily and increase home Lipitor to 863mdaily.   Hypertension Systolic BP ranging from 11476'Lo 150's this admission. Most recent BP 138/91.  - Will continue home Amlodipine 2.63m79maily and Lisinopril 63mg17mily.  - Will increase home Coreg from 3.1263mg18m6.263mg 60me daily.   Hyperlipidemia Most recent lipid panel from 09/2016: Cholesterol 179, Triglycerides 300, HDL 27, LDL 92. LDL goal <55 as stated above. - Will increase home Lipitor from 40mg t1mmg da10m - Continue home Fenofibrate.  - Will need to recheck lipid panel and CMP in 6 weeks.   Diabetes Mellitus  Continue home medications. Can restart Metformin 48 hours after cardiac catheterization. Follow-up with PCP.  History of Pulmonary Embolism - Patient on chronic Xarelto.  Chronic Pain Patient has chronic pain and takes Oxycodone 163mg fiv71mmes daily at home. This is managed by patient's PCP. Will continue home medications and have patient follow-up with PCP.  Leukocytosis WBC mildly elevated at 12.7 on pre-cath labs on 05/25/2019 and 13.1 today. Per chart review, patient has mildly elevated WBC going back to 2018.  Patient afebrile and no signs of infection. Will have patient follow-up with PCP. Consider repeat CBC.  All other home medications resumed.   Patient seen and examined by Dr. Nishan toJohnsie Canceld determined to be stable for discharge. Outpatient follow-up has been  arranged. Medications as below.  _____________  Discharge Vitals Blood pressure (!) 138/91, pulse 83, temperature (!) 97.4 F (36.3 C), temperature source Oral, resp. rate 20, height 6' 5"  (1.956 m), weight (!) 153.4 kg, SpO2 98 %.  Filed Weights   05/30/19 0810 05/31/19 0539  Weight: (!) 148.3 kg (!) 153.4 kg   General: Obese Caucasian male resting comfortably in no acute distress.  Head: Normocephalic and atraumatic. Eyes: Sclera clear. No xanthomas. Neck: Supple. Lungs: No increased work of breathing. Clear to auscultation bilaterally. No wheezes, rhonchi, or rales.  Heart: RRR. Distinct S1 and S2. No significant murmurs, gallops, or rubs. Right radial cath sight soft with no signs of hematoma.  Abdomen: Soft, non-distended, and non-tender. Bowel sounds are present. Msk: Normal strength and tone for age. Extremities: Trace lower extremities edema. Skin: Warm and dry. Neuro: Alert and oriented x3. No focal deficits.  Psych: Normal affect. Responds appropriately.   Labs & Radiologic Studies    CBC Recent Labs    05/30/19 1050 05/31/19 0407  WBC  --  13.1*  HGB 14.3 13.4  HCT 42.0 41.2  MCV  --  87.7  PLT  --  244   Bas465Metabolic Panel Recent Labs    05/30/19 1050 05/31/19 0407  NA 144 141  K 4.6 3.9  CL  --  109  CO2  --  24  GLUCOSE  --  109*  BUN  --  17  CREATININE  --  1.15  CALCIUM  --  9.0   Liver Function Tests No results for input(s): AST, ALT, ALKPHOS, BILITOT, PROT, ALBUMIN in the last 72 hours. No results for input(s): LIPASE, AMYLASE in the last 72 hours. Cardiac Enzymes No results for input(s): CKTOTAL, CKMB, CKMBINDEX, TROPONINI in the last 72 hours. BNP Invalid input(s): POCBNP D-Dimer No results for input(s): DDIMER in the last 72 hours. Hemoglobin A1C No results for input(s): HGBA1C in the last 72 hours. Fasting Lipid Panel No results for input(s): CHOL, HDL, LDLCALC, TRIG, CHOLHDL, LDLDIRECT in the last 72 hours. Thyroid Function  Tests No results for input(s): TSH, T4TOTAL, T3FREE, THYROIDAB in the last 72 hours.  Invalid input(s): FREET3 _____________  No results found. Disposition   Patient is being discharged home today in good condition.  Follow-up Plans & Appointments    Follow-up Information    Erlene Quan, PA-C Follow up.   Specialties: Cardiology, Radiology Why: You have a follow-up visit scheduled for 06/13/2019 at 1:45pm with Kerin Ransom, one of Dr. Blenda Mounts PAs. This is a virtual visit. Please see discharge instructions for more information about virtual visits.  Contact information: [address] [place] [address] [phone]        [name], NP Follow up.   Why: Please call your primary care physician's office to schedule a follow-up within the next 1-2 weeks.  Contact information: [address] [place] [address] [phone]          Discharge Instructions    Amb Referral to Cardiac Rehabilitation   Complete by: As directed    [email or web address]   Diagnosis:  Coronary Stents PTCA     After initial evaluation and assessments completed: Virtual Based Care may be provided alone or in conjunction with Phase 2 Cardiac Rehab based on patient barriers.: Yes   Diet - low sodium heart healthy   Complete by: As directed    Increase activity slowly   Complete by: As directed       Discharge Medications   Allergies as of 05/31/2019      Reactions   Sulfa Antibiotics Other (See Comments)   headache      Medication List    TAKE these medications   ALPRAZolam 1 MG tablet Commonly known as: XANAX Take 1-2 mg by mouth 4 (four) times daily as needed (anxiety).   amLODipine 2.5 MG tablet Commonly known as: NORVASC Take 2.5 mg by mouth daily.   atorvastatin 80 MG tablet Commonly known as: LIPITOR Take 1 tablet (80 mg total) by mouth daily at 6 PM. What changed:   medication strength  how much to take  when to take this    blood glucose meter kit and supplies Kit Dispense based on patient and insurance preference. Use up to four times daily as directed. (FOR ICD-9 250.00, 250.01).   buPROPion 150 MG 12 hr tablet Commonly known as: WELLBUTRIN SR Take 150 mg by mouth 2 (two) times daily.   carvedilol 6.25 MG tablet Commonly known as: COREG Take 1 tablet (6.25 mg total) by mouth 2 (two) times daily. What changed:   medication strength  how much to take   clopidogrel 75 MG tablet Commonly known as: PLAVIX Take 1 tablet (75 mg total) by mouth daily. Please schedule yearly appointment for more refills, thank you! 1st attempt   fenofibrate 145 MG tablet Commonly known as: TRICOR Take 145 mg by mouth  daily.   fluconazole 100 MG tablet Commonly known as: DIFLUCAN Take 100 mg by mouth every Monday.   fluticasone 50 MCG/ACT nasal spray Commonly known as: FLONASE Place 2 sprays into both nostrils daily as needed for allergies.   HumaLOG KwikPen 100 UNIT/ML KwikPen Generic drug: insulin lispro Inject 20-45 Units into the skin 3 (three) times daily with meals. sliding scale   Lantus SoloStar 100 UNIT/ML Solostar Pen Generic drug: Insulin Glargine Inject 70 Units into the skin 2 (two) times daily.   lisinopril 5 MG tablet Commonly known as: ZESTRIL Take 5 mg by mouth daily.   meclizine 25 MG tablet Commonly known as: ANTIVERT Take 25-50 mg by mouth See admin instructions. Take 2 tablets (50 mg) by mouth in the morning & take 1 tablet (25 mg) by mouth at night.   metFORMIN 1000 MG tablet Commonly known as: GLUCOPHAGE Take 1,000 mg by mouth 2 (two) times daily with a meal.   nitroGLYCERIN 0.4 MG SL tablet Commonly known as: NITROSTAT Place 1 tablet (0.4 mg total) under the tongue every 5 (five) minutes as needed for chest pain.   oxyCODONE 15 MG immediate release tablet Commonly known as: ROXICODONE Take 15 mg by mouth 5 (five) times daily.   pantoprazole 40 MG tablet Commonly known as:  PROTONIX Take 1 tablet (40 mg total) by mouth daily. What changed: how much to take   pregabalin 100 MG capsule Commonly known as: LYRICA Take 100-200 mg by mouth See admin instructions. Take 2 capsules (200 mg) by mouth in the morning & take 1 capsule (100 mg) by mouth at night.   ProAir HFA 108 (90 Base) MCG/ACT inhaler Generic drug: albuterol Inhale 2 puffs into the lungs every 6 (six) hours as needed for wheezing or shortness of breath.   promethazine 25 MG tablet Commonly known as: PHENERGAN Take 50 mg by mouth every 6 (six) hours as needed for nausea.   rivaroxaban 20 MG Tabs tablet Commonly known as: Xarelto Take 1 tablet (20 mg total) by mouth daily. What changed: additional instructions   Victoza 18 MG/3ML Sopn Generic drug: liraglutide Inject 1.2 mg into the skin at bedtime.        Acute coronary syndrome (MI, NSTEMI, STEMI, etc) this admission?: No.    Outstanding Labs/Studies   Repeat lipid panel and CMP in 6 weeks after increasing statin.  Duration of Discharge Encounter   Greater than 30 minutes including physician time.  Signed, Darreld Mclean, PA-C 05/31/2019, 11:12 AM

## 2019-05-31 NOTE — Telephone Encounter (Signed)
Patient is currently admitted

## 2019-05-31 NOTE — Progress Notes (Signed)
Pt declines walking, eating breakfast. He sts he struggles to walk more than 4-5 min due to a weak hip. Good discussion of stent and risk factors. He is willing to slowly work on risk factor modification. He is cutting back on smoking and thinking about setting a quit date. We discussed DM control and the risk of regular soft drinks. He was somewhat receptive. Understands the importance of Plavix and xarelto. Interested in virtual [place] at [facility] [employer]. His stent card is missing.  Pt is interested in participating in Virtual Cardiac Rehab. Pt advised that Virtual Cardiac Rehab is provided at no cost to the patient.  Checklist:  1. Pt has smart device  ie smartphone and/or ipad for downloading an app  Yes 2. Reliable internet/wifi service    Yes 3. Understands how to use their smartphone and navigate within an app.  Yes 4.  Reviewed with pt the scheduling process for virtual cardiac rehab.  Pt verbalized understanding. [place], ACSM 10:20 AM 05/31/2019

## 2019-06-01 NOTE — Telephone Encounter (Signed)
Patient contacted regarding discharge from [facility] on 05/31/2019.  Patient understands to follow up with provider Lurena Joiner PA on 06/13/19 at 1:45pm at [facility]. Patient understands discharge instructions? YES Patient understands medications and regiment? YES Patient understands to bring all medications to this visit? YES - n/a since virtual   Patient's younger brother had MI shortly after his hospital discharge so he is under stress from this.

## 2019-06-13 ENCOUNTER — Telehealth: Payer: Self-pay

## 2019-06-13 ENCOUNTER — Telehealth (INDEPENDENT_AMBULATORY_CARE_PROVIDER_SITE_OTHER): Payer: Medicare Other | Admitting: Cardiology

## 2019-06-13 ENCOUNTER — Encounter: Payer: Self-pay | Admitting: Cardiology

## 2019-06-13 DIAGNOSIS — Z86711 Personal history of pulmonary embolism: Secondary | ICD-10-CM

## 2019-06-13 DIAGNOSIS — G4733 Obstructive sleep apnea (adult) (pediatric): Secondary | ICD-10-CM

## 2019-06-13 DIAGNOSIS — I2510 Atherosclerotic heart disease of native coronary artery without angina pectoris: Secondary | ICD-10-CM

## 2019-06-13 DIAGNOSIS — E785 Hyperlipidemia, unspecified: Secondary | ICD-10-CM

## 2019-06-13 DIAGNOSIS — E119 Type 2 diabetes mellitus without complications: Secondary | ICD-10-CM

## 2019-06-13 DIAGNOSIS — Z9861 Coronary angioplasty status: Secondary | ICD-10-CM | POA: Diagnosis not present

## 2019-06-13 DIAGNOSIS — R609 Edema, unspecified: Secondary | ICD-10-CM | POA: Insufficient documentation

## 2019-06-13 DIAGNOSIS — Z794 Long term (current) use of insulin: Secondary | ICD-10-CM

## 2019-06-13 MED ORDER — HYDROCHLOROTHIAZIDE 12.5 MG PO CAPS: ORAL_CAPSULE | ORAL | 2 refills | Status: DC

## 2019-06-13 NOTE — Assessment & Plan Note (Addendum)
S/P distal CFX PCI with BMS Aug 2017- no other significant CAD and normal LVF.  C/P-March 2020 which led to June 2020-abnormal Myoview (drop in EF)- cath 80% mRCA-(DES), and 90% dCFX after the '17 DES-(medical Rx). His LVF was normal at cath

## 2019-06-13 NOTE — Telephone Encounter (Signed)
Contacted patient to discuss AVS Instructions. Gave him Luke's recommendations from today's virtual office visit. Informed patient that someone from the scheduling dept will be contacting him to schedule his follow up appt. He voiced understanding and AVS mailed to patient.

## 2019-06-13 NOTE — Patient Instructions (Addendum)
Medication Instructions:  START Hydrochlorothiazide 12.5mg  Take 1 tablet on Monday, Wednesday and Fridays  If you need a refill on your cardiac medications before your next appointment, please call your pharmacy.   Lab work: None  If you have labs (blood work) drawn today and your tests are completely normal, you will receive your results only by: Marland Kitchen MyChart Message (if you have MyChart) OR . A paper copy in the mail If you have any lab test that is abnormal or we need to change your treatment, we will call you to review the results.  Testing/Procedures: None   Follow-Up: At [facility], you and your health needs are our priority.  As part of our continuing mission to provide you with exceptional heart care, we have created designated Provider Care Teams.  These Care Teams include your primary Cardiologist (physician) and Advanced Practice Providers (APPs -  Physician Assistants and Nurse Practitioners) who all work together to provide you with the care you need, when you need it. . Your physician recommends that you schedule a follow-up appointment in: 3 months with Kerin Ransom, PA-C  Any Other Special Instructions Will Be Listed Below (If Applicable). I will reach out to our social worker to see if she can help you get a blood pressure cuff.

## 2019-06-13 NOTE — Assessment & Plan Note (Signed)
Lipitor increased to 80 mg after recent PCI-

## 2019-06-13 NOTE — Progress Notes (Signed)
Virtual Visit via Telephone Note   This visit type was conducted due to national recommendations for restrictions regarding the COVID-19 Pandemic (e.g. social distancing) in an effort to limit this patient's exposure and mitigate transmission in our community.  Due to his co-morbid illnesses, this patient is at least at moderate risk for complications without adequate follow up.  This format is felt to be most appropriate for this patient at this time.  The patient did not have access to video technology/had technical difficulties with video requiring transitioning to audio format only (telephone).  All issues noted in this document were discussed and addressed.  No physical exam could be performed with this format.  Please refer to the patient's chart for his  consent to telehealth for [facility].   Date:  06/13/2019   ID:  Joseph [name], DOB [birth year], MRN [id number]  Patient Location: Home Provider Location: Office  PCP:  [name], NP  Cardiologist:  Skeet Latch, MD  Electrophysiologist:  None   Evaluation Performed:  Follow-Up Visit   History of Present Illness:    Joseph [name] is a 48 y.o. male [place], who has a history of normal coronaries in 2006.  He has multiple other medical problems including insulin-dependent diabetes, hypertension, dyslipidemia, sleep apnea, chronic pain, prior pulmonary embolism in 2014 on chronic Xarelto, smoking, and obesity, at one point he was over 600 pounds.  In 2017 he presented with chest pain.  Catheterization was done and he had an 80% distal circumflex.  This was treated with a bare-metal stent.  He had no other significant coronary disease at that time.  Echocardiogram done in August 2017 showed an ejection fraction of 55 to 60% with mild LVH.   He was seen in the office in March 2020 with complaints of chest pain and LE edema.  Low dose Coreg was added, he had no LE edema on exam that day in the  office.  Echo and Myoview were ordered but delayed secondary to [place] pandemic. He did try low dose nitrate but couldn't tolerate it secondary to headache.  Myoview was eventually done 05/23/2019 and was abnormal with new LVD-EF 40%.  It was decided to proceed with diagnostic cath which was done 05/30/2019.  At cath his previously place CFX stent was patent but he had a distal 90% stenosis (medical Rx).  He also had an 80% mRCA stenosis that was treated with PCI/DES.    He was contacted today by phone for follow up.  He is doing well, no chest pain but he still has LE edema.  I suggested HCTZ 12.5 mg 3 x week.  I will arrange for an office visit in 2-3 months.  The patient does not have symptoms concerning for COVID-19 infection (fever, chills, cough, or new shortness of breath).    Past Medical History:  Diagnosis Date  . Anxiety   . Arthritis    "knees, shoulders, hips, ankles" (07/29/2016)  . Asthma   . CAD (coronary artery disease)    a. 2017: s/p BMS to distal Cx; b. LHC 05/30/2019: 80% mid, distal RCA s/p DES, 30% narrowing of d LM, widely patent LAD w/ luminal irregularities, widely patent stent in [place]  w/ 90+% stenosis distal to stent beofre small trifurcating obtuse marginal (potentially area of restenosis)  . Chronic bronchitis ([place])   . Chronic ulcer of right great toe ([place]) 09/19/2017  . COPD (chronic obstructive pulmonary disease) ([place])   .  Depression   . DVT (deep venous thrombosis) ([place])   . GERD (gastroesophageal reflux disease)   . Hyperlipidemia   . Hypertension   . Myocardial infarction ([place]) 1996   "light one"  . PE (pulmonary embolism) 04/2013   On chronic Xarelto  . Peripheral nerve disease   . Pneumonia "several times"  . Type II diabetes mellitus ([place])    Past Surgical History:  Procedure Laterality Date  . AMPUTATION Right 09/21/2017   Procedure: RIGHT GREAT TOE AMPUTATION, POSSIBLE VAC;  Surgeon: Leandrew Koyanagi, MD;  Location: [facility];  Service: Orthopedics;   Laterality: Right;  . CARDIAC CATHETERIZATION  2006  . CARDIAC CATHETERIZATION  1996   "@ [facility]; when I had my heart attack"  . CARDIAC CATHETERIZATION N/A 07/29/2016   Procedure: Left Heart Cath and Coronary Angiography;  Surgeon: Lorretta Harp, MD;  Location: [place] CV LAB;  Service: Cardiovascular;  Laterality: N/A;  . CARDIAC CATHETERIZATION N/A 07/29/2016   Procedure: Coronary Stent Intervention;  Surgeon: Lorretta Harp, MD;  Location: [place] CV LAB;  Service: Cardiovascular;  Laterality: N/A;  . CARPAL TUNNEL RELEASE Bilateral   . CORONARY ANGIOPLASTY WITH STENT PLACEMENT  07/29/2016  . CORONARY STENT INTERVENTION N/A 05/30/2019   Procedure: CORONARY STENT INTERVENTION;  Surgeon: Belva Crome, MD;  Location: [place] CV LAB;  Service: Cardiovascular;  Laterality: N/A;  . ESOPHAGOGASTRODUODENOSCOPY N/A 09/22/2017   Procedure: ESOPHAGOGASTRODUODENOSCOPY (EGD);  Surgeon: Ladene Artist, MD;  Location: [facility] ENDOSCOPY;  Service: Endoscopy;  Laterality: N/A;  . KNEE ARTHROSCOPY Bilateral    "2 on left; 1 on the right"  . RIGHT/LEFT HEART CATH AND CORONARY ANGIOGRAPHY N/A 05/30/2019   Procedure: RIGHT/LEFT HEART CATH AND CORONARY ANGIOGRAPHY;  Surgeon: Belva Crome, MD;  Location: [place] CV LAB;  Service: Cardiovascular;  Laterality: N/A;  . SHOULDER OPEN ROTATOR CUFF REPAIR Bilateral      Current Meds  Medication Sig  . albuterol (PROAIR HFA) 108 (90 BASE) MCG/ACT inhaler Inhale 2 puffs into the lungs every 6 (six) hours as needed for wheezing or shortness of breath.   . ALPRAZolam (XANAX) 1 MG tablet Take 1-2 mg by mouth 4 (four) times daily as needed (anxiety).   Marland Kitchen amLODipine (NORVASC) 2.5 MG tablet Take 2.5 mg by mouth daily.   Marland Kitchen atorvastatin (LIPITOR) 80 MG tablet Take 1 tablet (80 mg total) by mouth daily at 6 PM.  . blood glucose meter kit and supplies KIT Dispense based on patient and insurance preference. Use up to four times daily as directed. (FOR ICD-9  250.00, 250.01).  Marland Kitchen buPROPion (WELLBUTRIN SR) 150 MG 12 hr tablet Take 150 mg by mouth 2 (two) times daily.   . carvedilol (COREG) 6.25 MG tablet Take 1 tablet (6.25 mg total) by mouth 2 (two) times daily.  . clopidogrel (PLAVIX) 75 MG tablet Take 1 tablet (75 mg total) by mouth daily. Please schedule yearly appointment for more refills, thank you! 1st attempt  . fenofibrate (TRICOR) 145 MG tablet Take 145 mg by mouth daily.   . fluconazole (DIFLUCAN) 100 MG tablet Take 100 mg by mouth every Monday.  . fluticasone (FLONASE) 50 MCG/ACT nasal spray Place 2 sprays into both nostrils daily as needed for allergies.  Marland Kitchen HUMALOG KWIKPEN 100 UNIT/ML SOPN Inject 20-45 Units into the skin 3 (three) times daily with meals. sliding scale  . LANTUS SOLOSTAR 100 UNIT/ML Solostar Pen Inject 70 Units into the skin 2 (two) times daily.   Marland Kitchen lisinopril (  ZESTRIL) 5 MG tablet Take 5 mg by mouth daily.  . meclizine (ANTIVERT) 25 MG tablet Take 25-50 mg by mouth See admin instructions. Take 2 tablets (50 mg) by mouth in the morning & take 1 tablet (25 mg) by mouth at night.  . metFORMIN (GLUCOPHAGE) 1000 MG tablet Take 1,000 mg by mouth 2 (two) times daily with a meal.    . nitroGLYCERIN (NITROSTAT) 0.4 MG SL tablet Place 1 tablet (0.4 mg total) under the tongue every 5 (five) minutes as needed for chest pain.  Marland Kitchen oxyCODONE (ROXICODONE) 15 MG immediate release tablet Take 15 mg by mouth 5 (five) times daily.   . pantoprazole (PROTONIX) 40 MG tablet Take 1 tablet (40 mg total) by mouth daily. (Patient taking differently: Take 80 mg by mouth daily. )  . pregabalin (LYRICA) 100 MG capsule Take 100-200 mg by mouth See admin instructions. Take 2 capsules (200 mg) by mouth in the morning & take 1 capsule (100 mg) by mouth at night.  . promethazine (PHENERGAN) 25 MG tablet Take 50 mg by mouth every 6 (six) hours as needed for nausea.   . Rivaroxaban (XARELTO) 20 MG TABS Take 1 tablet (20 mg total) by mouth daily. (Patient taking  differently: Take 20 mg by mouth daily. Take with food)  . VICTOZA 18 MG/3ML SOPN Inject 1.2 mg into the skin at bedtime.     Allergies:   Sulfa antibiotics   Social History   Tobacco Use  . Smoking status: Current Every Day Smoker    Packs/day: 1.00    Years: 34.00    Pack years: 34.00    Types: Cigarettes  . Smokeless tobacco: Former Systems developer    Types: Snuff, Chew  . Tobacco comment: 8/910/2017 "3 ppd before 2015"  Substance Use Topics  . Alcohol use: Yes    Comment: RARE  . Drug use: No     Family Hx: The patient's family history includes Diabetes in an other family member; Hyperlipidemia in an other family member; Hypertension in his brother and father.  ROS:   Please see the history of present illness.    All other systems reviewed and are negative.   Prior CV studies:   The following studies were reviewed today: Cath/ PCI 05/30/2019  Labs/Other Tests and Data Reviewed:    EKG:  No ECG reviewed.  Recent Labs: 05/31/2019: BUN 17; Creatinine, Ser 1.15; Hemoglobin 13.4; Platelets 244; Potassium 3.9; Sodium 141   Recent Lipid Panel Lab Results  Component Value Date/Time   CHOL 179 09/29/2016 11:34 AM   TRIG 300 (H) 09/29/2016 11:34 AM   HDL 27 (L) 09/29/2016 11:34 AM   CHOLHDL 6.6 (H) 09/29/2016 11:34 AM   LDLCALC 92 09/29/2016 11:34 AM   LDLDIRECT 120.0 05/28/2015 09:51 AM    Wt Readings from Last 3 Encounters:  05/31/19 (!) 338 lb 3.2 oz (153.4 kg)  05/23/19 (!) 327 lb (148.3 kg)  03/08/19 (!) 327 lb (148.3 kg)     Objective:    Vital Signs:  There were no vitals taken for this visit.   VITAL SIGNS:  reviewed  ASSESSMENT & PLAN:    CAD/PCI- S/P distal CFX PCI with BMS Aug 2017- no other significant CAD and normal LVF. C/P-March 2020 which led to June 2020-abnormal Myoview (drop in EF)- cath 80% mRCA-(DES), and 90% dCFX after the '17 DES-(medical Rx). His LVF was normal at cath.  Edema- Add HCTZ 12.5 3 x week  HLD- Statin increased   HTN- He  has no way to check his B/P at home, will try and arrange for a monitor  H/O PE- On Xarelto (no ASA as he is on Plavix)  IDDM- Per PCP  COVID-19 Education: The signs and symptoms of COVID-19 were discussed with the patient and how to seek care for testing (follow up with PCP or arrange E-visit).  The importance of social distancing was discussed today.  Time:   Today, I have spent 20 minutes with the patient with telehealth technology discussing the above problems.     Medication Adjustments/Labs and Tests Ordered: Current medicines are reviewed at length with the patient today.  Concerns regarding medicines are outlined above.   Tests Ordered: No orders of the defined types were placed in this encounter.   Medication Changes: No orders of the defined types were placed in this encounter.   Follow Up:  Virtual Visit F/U in 2-3 months in the office  Signed, Kerin Ransom, PA-C  06/13/2019 2:00 PM    [place]

## 2019-06-13 NOTE — Telephone Encounter (Signed)
VERBAL CONSENT GIVEN TO ASHLEY FEATHERSTONE ON 06/11/2019 AT 4:22PM. (PER APPOINTMENT NOTES)     Virtual Visit Pre-Appointment Phone Call  "Joseph [name], I am calling you today to discuss your upcoming appointment. We are currently trying to limit exposure to the virus that causes COVID-19 by seeing patients at home rather than in the office."  1. "What is the BEST phone number to call the day of the visit?" - include this in appointment notes  2. "Do you have or have access to (through a family member/friend) a smartphone with video capability that we can use for your visit?" a. If yes - list this number in appt notes as "cell" (if different from BEST phone #) and list the appointment type as a VIDEO visit in appointment notes b. If no - list the appointment type as a PHONE visit in appointment notes  3. Confirm consent - "In the setting of the current Covid19 crisis, you are scheduled for a (phone or video) visit with your provider on (date) at (time).  Just as we do with many in-office visits, in order for you to participate in this visit, we must obtain consent.  If you'd like, I can send this to your mychart (if signed up) or email for you to review.  Otherwise, I can obtain your verbal consent now.  All virtual visits are billed to your insurance company just like a normal visit would be.  By agreeing to a virtual visit, we'd like you to understand that the technology does not allow for your provider to perform an examination, and thus may limit your provider's ability to fully assess your condition. If your provider identifies any concerns that need to be evaluated in person, we will make arrangements to do so.  Finally, though the technology is pretty good, we cannot assure that it will always work on either your or our end, and in the setting of a video visit, we may have to convert it to a phone-only visit.  In either situation, we cannot ensure that we have a secure connection.  Are you  willing to proceed?" STAFF: Did the patient verbally acknowledge consent to telehealth visit? Document YES/NO here: YES  4. Advise patient to be prepared - "Two hours prior to your appointment, go ahead and check your blood pressure, pulse, oxygen saturation, and your weight (if you have the equipment to check those) and write them all down. When your visit starts, your provider will ask you for this information. If you have an Apple Watch or Kardia device, please plan to have heart rate information ready on the day of your appointment. Please have a pen and paper handy nearby the day of the visit as well."  5. Give patient instructions for MyChart download to smartphone OR Doximity/Doxy.me as below if video visit (depending on what platform provider is using)  6. Inform patient they will receive a phone call 15 minutes prior to their appointment time (may be from unknown caller ID) so they should be prepared to answer    TELEPHONE CALL NOTE  Joseph [name] has been deemed a candidate for a follow-up tele-health visit to limit community exposure during the Covid-19 pandemic. I spoke with the patient via phone to ensure availability of phone/video source, confirm preferred email & phone number, and discuss instructions and expectations.  I reminded Joseph [name] to be prepared with any vital sign and/or heart rhythm information that could potentially be obtained via home monitoring, at the  time of his visit. I reminded Joseph [name] to expect a phone call prior to his visit.  Lucita FerraraYoung, Monesha Monreal T, CMA 06/13/2019 8:30 AM   INSTRUCTIONS FOR DOWNLOADING THE MYCHART APP TO SMARTPHONE  - The patient must first make sure to have activated MyChart and know their login information - If Apple, go to [employer] Store and type in MyChart in the search bar and download the app. If Android, ask patient to go to [employer] Play Store and type in [place] in the search bar and download the app. The app is free but as with  any other app downloads, their phone may require them to verify saved payment information or Apple/Android password.  - The patient will need to then log into the app with their MyChart username and password, and select [facility] as their healthcare provider to link the account. When it is time for your visit, go to the MyChart app, find appointments, and click Begin Video Visit. Be sure to Select Allow for your device to access the Microphone and Camera for your visit. You will then be connected, and your provider will be with you shortly.  **If they have any issues connecting, or need assistance please contact MyChart service desk [phone])**  **If using a computer, in order to ensure the best quality for their visit they will need to use either of the following Internet Browsers: [employer] Edge, or Google Chrome**  IF USING DOXIMITY or DOXY.ME - The patient will receive a link just prior to their visit by text.     FULL LENGTH CONSENT FOR TELE-HEALTH VISIT   I hereby voluntarily request, consent and authorize [facility] and its employed or contracted physicians, physician assistants, nurse practitioners or other licensed health care professionals (the Practitioner), to provide me with telemedicine health care services (the "Services") as deemed necessary by the treating Practitioner. I acknowledge and consent to receive the Services by the Practitioner via telemedicine. I understand that the telemedicine visit will involve communicating with the Practitioner through live audiovisual communication technology and the disclosure of certain medical information by electronic transmission. I acknowledge that I have been given the opportunity to request an in-person assessment or other available alternative prior to the telemedicine visit and am voluntarily participating in the telemedicine visit.  I understand that I have the right to withhold or withdraw my consent to the use of  telemedicine in the course of my care at any time, without affecting my right to future care or treatment, and that the Practitioner or I may terminate the telemedicine visit at any time. I understand that I have the right to inspect all information obtained and/or recorded in the course of the telemedicine visit and may receive copies of available information for a reasonable fee.  I understand that some of the potential risks of receiving the Services via telemedicine include:  Marland Kitchen. Delay or interruption in medical evaluation due to technological equipment failure or disruption; . Information transmitted may not be sufficient (e.g. poor resolution of images) to allow for appropriate medical decision making by the Practitioner; and/or  . In rare instances, security protocols could fail, causing a breach of personal health information.  Furthermore, I acknowledge that it is my responsibility to provide information about my medical history, conditions and care that is complete and accurate to the best of my ability. I acknowledge that Practitioner's advice, recommendations, and/or decision may be based on factors not within their control, such as incomplete or inaccurate data  provided by me or distortions of diagnostic images or specimens that may result from electronic transmissions. I understand that the practice of medicine is not an exact science and that Practitioner makes no warranties or guarantees regarding treatment outcomes. I acknowledge that I will receive a copy of this consent concurrently upon execution via email to the email address I last provided but may also request a printed copy by calling the office of [facility].    I understand that my insurance will be billed for this visit.   I have read or had this consent read to me. . I understand the contents of this consent, which adequately explains the benefits and risks of the Services being provided via telemedicine.  . I have been  provided ample opportunity to ask questions regarding this consent and the Services and have had my questions answered to my satisfaction. . I give my informed consent for the services to be provided through the use of telemedicine in my medical care  By participating in this telemedicine visit I agree to the above.

## 2019-06-13 NOTE — Assessment & Plan Note (Signed)
2014- chronic Xarelto

## 2019-06-13 NOTE — Assessment & Plan Note (Signed)
Pt complains of LE edema- started about 3 months ago, he has been on Norvasc 2.5 "for years" without problems.

## 2019-06-18 ENCOUNTER — Telehealth: Payer: Self-pay | Admitting: Licensed Clinical Social Worker

## 2019-06-18 NOTE — Telephone Encounter (Signed)
CSW referred to assist patient with obtaining a BP cuff. CSW contacted patient to inform cuff will be delivered to home although no answer so message left. Raquel Sarna, [place], [place]

## 2019-06-21 ENCOUNTER — Other Ambulatory Visit (HOSPITAL_COMMUNITY): Payer: Medicare Other

## 2019-07-30 ENCOUNTER — Other Ambulatory Visit (HOSPITAL_COMMUNITY): Payer: Medicare Other

## 2019-08-08 ENCOUNTER — Ambulatory Visit (HOSPITAL_COMMUNITY): Payer: Medicare Other | Attending: Cardiology

## 2019-08-13 ENCOUNTER — Telehealth (HOSPITAL_COMMUNITY): Payer: Self-pay

## 2019-08-13 NOTE — Telephone Encounter (Signed)
New message    Just an FYI. We will be removing the patient from the echo WQ.  8.19.20 no show  8.10.20 Cancel Rsn: Patient (patient had and emergency )  7.2.20 no show  5.29.20 Cancel Rsn: Patient (patient running 20 min late)  5.14.20 @ 10:15am Both # are the same lm on home Verdean Murin 5.12.20 @ 10:42am lm on home vm to call the office - Markia Kyer  5.6.20 @ 12:10pm lm on home vm - [phone]  4.8.20 no show  - Loyce Klasen  COVID19

## 2019-08-22 ENCOUNTER — Encounter (HOSPITAL_COMMUNITY): Payer: Medicare Other

## 2019-09-17 ENCOUNTER — Telehealth: Payer: Self-pay | Admitting: Cardiology

## 2019-09-17 NOTE — Telephone Encounter (Signed)
LVM for patient to call and schedule appointment with Kerin Ransom.

## 2019-09-20 ENCOUNTER — Telehealth: Payer: Self-pay | Admitting: Cardiology

## 2019-09-20 NOTE — Telephone Encounter (Signed)
LVM for patient to call and schedule 3 month followup with Lurena Joiner.

## 2019-09-25 ENCOUNTER — Telehealth: Payer: Self-pay | Admitting: *Deleted

## 2019-09-25 NOTE — Telephone Encounter (Signed)
A message was left, re: follow up visit. 

## 2019-10-03 ENCOUNTER — Telehealth: Payer: Self-pay | Admitting: *Deleted

## 2019-10-03 NOTE — Telephone Encounter (Signed)
A message was left, re: his follow up visit. 

## 2019-10-08 ENCOUNTER — Encounter (HOSPITAL_BASED_OUTPATIENT_CLINIC_OR_DEPARTMENT_OTHER): Payer: Medicare Other | Admitting: Internal Medicine

## 2019-10-25 ENCOUNTER — Ambulatory Visit: Payer: Medicare Other | Admitting: Cardiology

## 2019-10-31 ENCOUNTER — Telehealth: Payer: Self-pay | Admitting: Cardiology

## 2019-10-31 NOTE — Telephone Encounter (Signed)
Spoke with patient and he stated he had to take 3 NTG yesterday. Did advise if 3 NTG needed he needs to call 911. Patient stated he did not want to go to ED and wouldn't if he was able to breath. Pain off and on for a few weeks. Advised to take it easy today and scheduled office visit for am with Thomasene Mohair FNP. Did advise again if needs 3 NTG to call 911 when he uses 3 rd one

## 2019-10-31 NOTE — Telephone Encounter (Signed)
New Message     Pt c/o of Chest Pain: STAT if CP now or developed within 24 hours  1. Are you having CP right now? No not right now   2. Are you experiencing any other symptoms (ex. SOB, nausea, vomiting, sweating)? Sweating, nausea- he takes medicine for nausea as well   3. How long have you been experiencing CP? 3 weeks   4. Is your CP continuous or coming and going?  After taking Nitro It goes away   5. Have you taken Nitroglycerin? Yes, pt used it last time yesterday evening    Pt says both of his legs are really swollen, it has happened just over the last week, he has gained some wt he did not specify  ?

## 2019-11-01 ENCOUNTER — Encounter (HOSPITAL_COMMUNITY): Payer: Self-pay

## 2019-11-01 ENCOUNTER — Other Ambulatory Visit: Payer: Self-pay

## 2019-11-01 ENCOUNTER — Ambulatory Visit (INDEPENDENT_AMBULATORY_CARE_PROVIDER_SITE_OTHER): Payer: Medicare Other | Admitting: General Practice

## 2019-11-01 ENCOUNTER — Inpatient Hospital Stay (HOSPITAL_COMMUNITY)
Admission: EM | Admit: 2019-11-01 | Discharge: 2019-11-03 | DRG: 247 | Disposition: A | Discharge status: Home / Self Care | Payer: Medicare Other | Source: Ambulatory Visit | Attending: Cardiology | Admitting: Cardiology

## 2019-11-01 ENCOUNTER — Encounter: Payer: Self-pay | Admitting: General Practice

## 2019-11-01 ENCOUNTER — Emergency Department (HOSPITAL_COMMUNITY): Payer: Medicare Other

## 2019-11-01 VITALS — BP 157/95 | HR 94 | Ht 77.0 in | Wt 389.0 lb

## 2019-11-01 DIAGNOSIS — Z86711 Personal history of pulmonary embolism: Secondary | ICD-10-CM | POA: Diagnosis present

## 2019-11-01 DIAGNOSIS — Z8249 Family history of ischemic heart disease and other diseases of the circulatory system: Secondary | ICD-10-CM

## 2019-11-01 DIAGNOSIS — Z9861 Coronary angioplasty status: Secondary | ICD-10-CM

## 2019-11-01 DIAGNOSIS — Z20828 Contact with and (suspected) exposure to other viral communicable diseases: Secondary | ICD-10-CM | POA: Diagnosis present

## 2019-11-01 DIAGNOSIS — E785 Hyperlipidemia, unspecified: Secondary | ICD-10-CM | POA: Diagnosis present

## 2019-11-01 DIAGNOSIS — I2510 Atherosclerotic heart disease of native coronary artery without angina pectoris: Secondary | ICD-10-CM

## 2019-11-01 DIAGNOSIS — Z955 Presence of coronary angioplasty implant and graft: Secondary | ICD-10-CM

## 2019-11-01 DIAGNOSIS — I200 Unstable angina: Secondary | ICD-10-CM | POA: Diagnosis not present

## 2019-11-01 DIAGNOSIS — M7989 Other specified soft tissue disorders: Secondary | ICD-10-CM

## 2019-11-01 DIAGNOSIS — Z6841 Body Mass Index (BMI) 40.0 and over, adult: Secondary | ICD-10-CM

## 2019-11-01 DIAGNOSIS — R600 Localized edema: Secondary | ICD-10-CM

## 2019-11-01 DIAGNOSIS — R079 Chest pain, unspecified: Secondary | ICD-10-CM | POA: Diagnosis not present

## 2019-11-01 DIAGNOSIS — F17210 Nicotine dependence, cigarettes, uncomplicated: Secondary | ICD-10-CM | POA: Diagnosis present

## 2019-11-01 DIAGNOSIS — E669 Obesity, unspecified: Secondary | ICD-10-CM | POA: Diagnosis present

## 2019-11-01 DIAGNOSIS — Z7901 Long term (current) use of anticoagulants: Secondary | ICD-10-CM

## 2019-11-01 DIAGNOSIS — E1165 Type 2 diabetes mellitus with hyperglycemia: Secondary | ICD-10-CM | POA: Diagnosis present

## 2019-11-01 DIAGNOSIS — I4891 Unspecified atrial fibrillation: Secondary | ICD-10-CM | POA: Diagnosis present

## 2019-11-01 DIAGNOSIS — J449 Chronic obstructive pulmonary disease, unspecified: Secondary | ICD-10-CM | POA: Diagnosis present

## 2019-11-01 DIAGNOSIS — Z794 Long term (current) use of insulin: Secondary | ICD-10-CM

## 2019-11-01 DIAGNOSIS — I444 Left anterior fascicular block: Secondary | ICD-10-CM | POA: Diagnosis present

## 2019-11-01 DIAGNOSIS — I25110 Atherosclerotic heart disease of native coronary artery with unstable angina pectoris: Secondary | ICD-10-CM | POA: Diagnosis not present

## 2019-11-01 DIAGNOSIS — I10 Essential (primary) hypertension: Secondary | ICD-10-CM

## 2019-11-01 DIAGNOSIS — Z86718 Personal history of other venous thrombosis and embolism: Secondary | ICD-10-CM

## 2019-11-01 DIAGNOSIS — I252 Old myocardial infarction: Secondary | ICD-10-CM

## 2019-11-01 DIAGNOSIS — F329 Major depressive disorder, single episode, unspecified: Secondary | ICD-10-CM | POA: Diagnosis present

## 2019-11-01 DIAGNOSIS — E11628 Type 2 diabetes mellitus with other skin complications: Secondary | ICD-10-CM

## 2019-11-01 LAB — CREATININE, SERUM
Creatinine, Ser: 1.18 mg/dL (ref 0.61–1.24)
GFR calc Af Amer: >60 mL/min (ref >60)
GFR calc non Af Amer: >60 mL/min (ref >60)

## 2019-11-01 LAB — TROPONIN I (HIGH SENSITIVITY)
Troponin I (High Sensitivity): 41 ng/L — ABNORMAL HIGH (ref <18)
Troponin I (High Sensitivity): 33 ng/L — ABNORMAL HIGH (ref <18)
Troponin I (High Sensitivity): 34 ng/L — ABNORMAL HIGH (ref <18)
Troponin I (High Sensitivity): 33 ng/L — ABNORMAL HIGH (ref <18)

## 2019-11-01 LAB — BASIC METABOLIC PANEL
Anion gap: 11 (ref 5–15)
BUN: 18 mg/dL (ref 6–20)
CO2: 24 mmol/L (ref 22–32)
Calcium: 9.3 mg/dL (ref 8.9–10.3)
Chloride: 106 mmol/L (ref 98–111)
Creatinine, Ser: 1.2 mg/dL (ref 0.61–1.24)
GFR calc Af Amer: >60 mL/min (ref >60)
GFR calc non Af Amer: >60 mL/min (ref >60)
Glucose, Bld: 138 mg/dL — ABNORMAL HIGH (ref 70–99)
Potassium: 4.4 mmol/L (ref 3.5–5.1)
Sodium: 141 mmol/L (ref 135–145)

## 2019-11-01 LAB — CBC
HCT: 43.4 % (ref 39.0–52.0)
Hemoglobin: 13.5 g/dL (ref 13.0–17.0)
MCH: 28.9 pg (ref 26.0–34.0)
MCHC: 31.1 g/dL (ref 30.0–36.0)
MCV: 92.9 fL (ref 80.0–100.0)
Platelets: 250 10*3/uL (ref 150–400)
RBC: 4.67 MIL/uL (ref 4.22–5.81)
RDW: 13.1 % (ref 11.5–15.5)
WBC: 9.4 10*3/uL (ref 4.0–10.5)
nRBC: 0 % (ref 0.0–0.2)

## 2019-11-01 LAB — HEMOGLOBIN A1C
Hgb A1c MFr Bld: 8.2 % — ABNORMAL HIGH (ref 4.8–5.6)
Mean Plasma Glucose: 188.64 mg/dL

## 2019-11-01 LAB — BRAIN NATRIURETIC PEPTIDE: B Natriuretic Peptide: 43.3 pg/mL (ref 0.0–100.0)

## 2019-11-01 LAB — CBG MONITORING, ED: Glucose-Capillary: 131 mg/dL — ABNORMAL HIGH (ref 70–99)

## 2019-11-01 MED ORDER — CLOPIDOGREL BISULFATE 75 MG PO TABS
75.0000 mg | ORAL_TABLET | Freq: Every day | ORAL | Status: DC
  Administered 2019-11-01 – 2019-11-03 (×3): 75 mg via ORAL
  Filled 2019-11-01 (×3): qty 1

## 2019-11-01 MED ORDER — SODIUM CHLORIDE 0.9% FLUSH: 3.0000 mL | INTRAVENOUS | Status: DC | PRN

## 2019-11-01 MED ORDER — ACETAMINOPHEN 325 MG PO TABS: 650.0000 mg | ORAL_TABLET | ORAL | Status: DC | PRN

## 2019-11-01 MED ORDER — SODIUM CHLORIDE 0.9 % WEIGHT BASED INFUSION
3.0000 mL/kg/h | INTRAVENOUS | Status: DC
  Administered 2019-11-02: 3 mL/kg/h via INTRAVENOUS

## 2019-11-01 MED ORDER — OXYCODONE HCL 5 MG PO TABS
15.0000 mg | ORAL_TABLET | Freq: Every day | ORAL | Status: DC
  Administered 2019-11-01 – 2019-11-03 (×9): 15 mg via ORAL
  Filled 2019-11-01 (×9): qty 3

## 2019-11-01 MED ORDER — ACETAMINOPHEN 500 MG PO TABS
1000.0000 mg | ORAL_TABLET | Freq: Once | ORAL | Status: DC
  Filled 2019-11-01: qty 2

## 2019-11-01 MED ORDER — HEPARIN SODIUM (PORCINE) 5000 UNIT/ML IJ SOLN
5000.0000 [IU] | Freq: Three times a day (TID) | INTRAMUSCULAR | Status: DC
  Administered 2019-11-01 – 2019-11-02 (×2): 5000 [IU] via SUBCUTANEOUS
  Filled 2019-11-01 (×2): qty 1

## 2019-11-01 MED ORDER — FENOFIBRATE 160 MG PO TABS
160.0000 mg | ORAL_TABLET | Freq: Every day | ORAL | Status: DC
  Administered 2019-11-01 – 2019-11-03 (×3): 160 mg via ORAL
  Filled 2019-11-01 (×3): qty 1

## 2019-11-01 MED ORDER — ONDANSETRON HCL 4 MG/2ML IJ SOLN: 4.0000 mg | Freq: Four times a day (QID) | INTRAMUSCULAR | Status: DC | PRN

## 2019-11-01 MED ORDER — SODIUM CHLORIDE 0.9 % WEIGHT BASED INFUSION
1.0000 mL/kg/h | INTRAVENOUS | Status: DC
  Administered 2019-11-02 (×2): 1 mL/kg/h via INTRAVENOUS

## 2019-11-01 MED ORDER — ATORVASTATIN CALCIUM 80 MG PO TABS
80.0000 mg | ORAL_TABLET | Freq: Every day | ORAL | Status: DC
  Administered 2019-11-02: 18:00:00 80 mg via ORAL
  Filled 2019-11-01: qty 1

## 2019-11-01 MED ORDER — ASPIRIN EC 81 MG PO TBEC
81.0000 mg | DELAYED_RELEASE_TABLET | Freq: Every day | ORAL | Status: DC
  Administered 2019-11-02 – 2019-11-03 (×2): 81 mg via ORAL
  Filled 2019-11-01 (×2): qty 1

## 2019-11-01 MED ORDER — CARVEDILOL 6.25 MG PO TABS
6.2500 mg | ORAL_TABLET | Freq: Two times a day (BID) | ORAL | Status: DC
  Administered 2019-11-01 – 2019-11-03 (×4): 6.25 mg via ORAL
  Filled 2019-11-01 (×4): qty 1

## 2019-11-01 MED ORDER — INSULIN ASPART 100 UNIT/ML ~~LOC~~ SOLN
0.0000 [IU] | Freq: Three times a day (TID) | SUBCUTANEOUS | Status: DC
  Administered 2019-11-02: 07:00:00 3 [IU] via SUBCUTANEOUS
  Administered 2019-11-03 (×2): 5 [IU] via SUBCUTANEOUS

## 2019-11-01 MED ORDER — ASPIRIN 81 MG PO CHEW
324.0000 mg | CHEWABLE_TABLET | Freq: Once | ORAL | Status: AC
  Administered 2019-11-01: 324 mg via ORAL
  Filled 2019-11-01: qty 4

## 2019-11-01 MED ORDER — PANTOPRAZOLE SODIUM 40 MG PO TBEC
40.0000 mg | DELAYED_RELEASE_TABLET | Freq: Every day | ORAL | Status: DC
  Administered 2019-11-01 – 2019-11-03 (×3): 40 mg via ORAL
  Filled 2019-11-01 (×4): qty 1

## 2019-11-01 MED ORDER — BUPROPION HCL ER (SR) 150 MG PO TB12
150.0000 mg | ORAL_TABLET | Freq: Two times a day (BID) | ORAL | Status: DC
  Administered 2019-11-01 – 2019-11-03 (×4): 150 mg via ORAL
  Filled 2019-11-01 (×4): qty 1

## 2019-11-01 MED ORDER — SODIUM CHLORIDE 0.9 % IV SOLN: 250.0000 mL | INTRAVENOUS | Status: DC | PRN

## 2019-11-01 MED ORDER — SODIUM CHLORIDE 0.9% FLUSH
3.0000 mL | Freq: Two times a day (BID) | INTRAVENOUS | Status: DC
  Administered 2019-11-02: 3 mL via INTRAVENOUS

## 2019-11-01 MED ORDER — NITROGLYCERIN 0.4 MG SL SUBL
0.4000 mg | SUBLINGUAL_TABLET | SUBLINGUAL | Status: DC | PRN
  Filled 2019-11-01: qty 1

## 2019-11-01 MED ORDER — AMLODIPINE BESYLATE 2.5 MG PO TABS
2.5000 mg | ORAL_TABLET | Freq: Every day | ORAL | Status: DC
  Administered 2019-11-01 – 2019-11-03 (×3): 2.5 mg via ORAL
  Filled 2019-11-01 (×3): qty 1

## 2019-11-01 NOTE — ED Triage Notes (Signed)
Pt reports his doctor made him come. Pt states he has had chest pain that radiates into his left arm and wrist for 3 weeks. Pt reports BLE edema. Pt reports he has been taking 3-4 nitros a day. Pt reports taking a nitro at 2am this morning. Pt reports it is difficult to take a breath sometimes.

## 2019-11-01 NOTE — ED Provider Notes (Signed)
[facility] EMERGENCY DEPARTMENT Provider Note   CSN: [id number] Arrival date & time: 11/01/19  1145     History   Chief Complaint Chief Complaint  Patient presents with  . Chest Pain    HPI Joseph [name] is a 48 y.o. male.     HPI  48 year old male presents with chest pain.  On and off for multiple weeks.  Occurs nearly daily.  Sometimes when he is working on his truck but other times at rest.  The patient Also gets short of breath.  He chronically has nausea.  Has also noticed leg swelling though today is little better.  Over the last for 5 days has been taking multiple nitroglycerin per day which does help relieve the pain.  Currently the pain is better but about a 6 out of 10.  Sent over here by his cardiologist for ACS rule out.  Past Medical History:  Diagnosis Date  . Anxiety   . Arthritis    "knees, shoulders, hips, ankles" (07/29/2016)  . Asthma   . CAD (coronary artery disease)    a. 2017: s/p BMS to distal Cx; b. LHC 05/30/2019: 80% mid, distal RCA s/p DES, 30% narrowing of d LM, widely patent LAD w/ luminal irregularities, widely patent stent in [place]  w/ 90+% stenosis distal to stent beofre small trifurcating obtuse marginal (potentially area of restenosis)  . Chronic bronchitis ([place])   . Chronic ulcer of right great toe ([place]) 09/19/2017  . COPD (chronic obstructive pulmonary disease) ([place])   . Depression   . DVT (deep venous thrombosis) ([place])   . GERD (gastroesophageal reflux disease)   . Hyperlipidemia   . Hypertension   . Myocardial infarction ([place]) 1996   "light one"  . PE (pulmonary embolism) 04/2013   On chronic Xarelto  . Peripheral nerve disease   . Pneumonia "several times"  . Type II diabetes mellitus [facility])     Patient Active Problem List   Diagnosis Date Noted  . Edema 06/13/2019  . Leukocytosis 05/31/2019  . Smoker 03/08/2019  . Chronic pain 03/08/2019  . Chronic back pain 02/16/2019  . Deep venous thrombosis ([place])  02/16/2019  . Morbid obesity ([place]) 02/16/2019  . Peripheral nerve disease 02/16/2019  . Chronic obstructive lung disease ([place]) 01/22/2019  . Anxiety disorder 03/26/2018  . Open wound of toe 10/12/2017  . Sinusitis 10/12/2017  . Abdominal pain   . Loss of weight   . Diabetic foot infection ([place]) 09/16/2017  . Uncontrolled type 2 diabetes mellitus with hyperglycemia, with long-term current use of insulin ([place]) 09/16/2017  . Cellulitis of right foot   . Chronic ulcer of great toe of right foot ([place]) 09/09/2017  . Recurrent chest pain 07/29/2016  . Hyperglycemia   . Insulin dependent type 2 diabetes mellitus ([place]) 04/29/2015  . OSA (obstructive sleep apnea) 05/08/2013  . History of pulmonary embolus (PE) 04/27/2013  . CAD -S/P PCI 09/10/2008  . Dyslipidemia 09/09/2008    Past Surgical History:  Procedure Laterality Date  . AMPUTATION Right 09/21/2017   Procedure: RIGHT GREAT TOE AMPUTATION, POSSIBLE VAC;  Surgeon: Leandrew Koyanagi, MD;  Location: [facility];  Service: Orthopedics;  Laterality: Right;  . CARDIAC CATHETERIZATION  2006  . CARDIAC CATHETERIZATION  1996   "@ [facility]; when I had my heart attack"  . CARDIAC CATHETERIZATION N/A 07/29/2016   Procedure: Left Heart Cath and Coronary Angiography;  Surgeon: Lorretta Harp, MD;  Location: [place] CV LAB;  Service: Cardiovascular;  Laterality: N/A;  . CARDIAC CATHETERIZATION N/A 07/29/2016   Procedure: Coronary Stent Intervention;  Surgeon: Lorretta Harp, MD;  Location: [place] CV LAB;  Service: Cardiovascular;  Laterality: N/A;  . CARPAL TUNNEL RELEASE Bilateral   . CORONARY ANGIOPLASTY WITH STENT PLACEMENT  07/29/2016  . CORONARY STENT INTERVENTION N/A 05/30/2019   Procedure: CORONARY STENT INTERVENTION;  Surgeon: Belva Crome, MD;  Location: [place] CV LAB;  Service: Cardiovascular;  Laterality: N/A;  . ESOPHAGOGASTRODUODENOSCOPY N/A 09/22/2017   Procedure: ESOPHAGOGASTRODUODENOSCOPY (EGD);  Surgeon: Ladene Artist, MD;   [facility] ENDOSCOPY;  Service: Endoscopy;  Laterality: N/A;  . KNEE ARTHROSCOPY Bilateral    "2 on left; 1 on the right"  . RIGHT/LEFT HEART CATH AND CORONARY ANGIOGRAPHY N/A 05/30/2019   Procedure: RIGHT/LEFT HEART CATH AND CORONARY ANGIOGRAPHY;  Surgeon: Belva Crome, MD;  Location: [place] CV LAB;  Service: Cardiovascular;  Laterality: N/A;  . SHOULDER OPEN ROTATOR CUFF REPAIR Bilateral         Home Medications    Prior to Admission medications   Medication Sig Start Date End Date Taking? Authorizing Provider  albuterol (PROAIR HFA) 108 (90 BASE) MCG/ACT inhaler Inhale 2 puffs into the lungs every 6 (six) hours as needed for wheezing or shortness of breath.     [provider]  ALPRAZolam Duanne Moron) 1 MG tablet Take 1-2 mg by mouth 4 (four) times daily as needed (anxiety).     [provider]  amLODipine (NORVASC) 2.5 MG tablet Take 2.5 mg by mouth daily.     [provider]  atorvastatin (LIPITOR) 80 MG tablet Take 1 tablet (80 mg total) by mouth daily at 6 PM. 05/31/19   Sande Rives E, PA-C  blood glucose meter kit and supplies KIT Dispense based on patient and insurance preference. Use up to four times daily as directed. (FOR ICD-9 250.00, 250.01). 09/23/17   Hongalgi, Lenis Dickinson, MD  buPROPion (WELLBUTRIN SR) 150 MG 12 hr tablet Take 150 mg by mouth 2 (two) times daily.  08/17/16   [provider]  carvedilol (COREG) 6.25 MG tablet Take 1 tablet (6.25 mg total) by mouth 2 (two) times daily. 05/31/19   Darreld Mclean, PA-C  clopidogrel (PLAVIX) 75 MG tablet Take 1 tablet (75 mg total) by mouth daily. Please schedule yearly appointment for more refills, thank you! 1st attempt 05/31/19   Darreld Mclean, PA-C  fenofibrate (TRICOR) 145 MG tablet Take 145 mg by mouth daily.     [provider]  fluconazole (DIFLUCAN) 100 MG tablet Take 100 mg by mouth every Monday.    [provider]  fluticasone (FLONASE) 50 MCG/ACT nasal  spray Place 2 sprays into both nostrils daily as needed for allergies. 03/14/19   [provider]  HUMALOG KWIKPEN 100 UNIT/ML SOPN Inject 20-45 Units into the skin 3 (three) times daily with meals. sliding scale 05/02/13   [provider]  hydrochlorothiazide (MICROZIDE) 12.5 MG capsule Take 1 tablet on Monday, Wednesday and Friday  prn edema 06/13/19   Erlene Quan, PA-C  LANTUS SOLOSTAR 100 UNIT/ML Solostar Pen Inject 70 Units into the skin 2 (two) times daily.  12/01/14   [provider]  lisinopril (ZESTRIL) 5 MG tablet Take 5 mg by mouth daily.    [provider]  meclizine (ANTIVERT) 25 MG tablet Take 25-50 mg by mouth See admin instructions. Take 2 tablets (50 mg) by mouth in the morning & take 1 tablet (25 mg) by  mouth at night. 04/10/13   [provider]  metFORMIN (GLUCOPHAGE) 1000 MG tablet Take 1,000 mg by mouth 2 (two) times daily with a meal.      [provider]  nitroGLYCERIN (NITROSTAT) 0.4 MG SL tablet Place 1 tablet (0.4 mg total) under the tongue every 5 (five) minutes as needed for chest pain. 03/08/19   Erlene Quan, PA-C  oxyCODONE (ROXICODONE) 15 MG immediate release tablet Take 15 mg by mouth 5 (five) times daily.     [provider]  pantoprazole (PROTONIX) 40 MG tablet Take 1 tablet (40 mg total) by mouth daily. Patient taking differently: Take 80 mg by mouth daily.  09/23/17   Hongalgi, Lenis Dickinson, MD  pregabalin (LYRICA) 100 MG capsule Take 150 mg by mouth 3 (three) times daily. Take 2 capsules (200 mg) by mouth in the morning & take 1 capsule (100 mg) by mouth at night. 05/06/19   [provider]  promethazine (PHENERGAN) 25 MG tablet Take 50 mg by mouth every 6 (six) hours as needed for nausea.     [provider]  Rivaroxaban (XARELTO) 20 MG TABS Take 1 tablet (20 mg total) by mouth daily. Patient taking differently: Take 20 mg by mouth daily. Take with food 05/28/13   Radene Gunning, NP  VICTOZA  18 MG/3ML SOPN Inject 1.2 mg into the skin at bedtime. 04/30/19   [provider]    Family History Family History  Problem Relation Age of Onset  . Hypertension Brother   . Hypertension Father   . Diabetes Other   . Hyperlipidemia Other     Social History Social History   Tobacco Use  . Smoking status: Current Every Day Smoker    Packs/day: 1.00    Years: 34.00    Pack years: 34.00    Types: Cigarettes  . Smokeless tobacco: Former Systems developer    Types: Snuff, Chew  . Tobacco comment: 8/910/2017 "3 ppd before 2015"  Substance Use Topics  . Alcohol use: Yes    Comment: RARE  . Drug use: No     Allergies   Sulfa antibiotics   Review of Systems Review of Systems  Respiratory: Positive for shortness of breath.   Cardiovascular: Positive for chest pain and leg swelling.  Gastrointestinal: Positive for nausea and vomiting.  All other systems reviewed and are negative.    Physical Exam Updated Vital Signs BP (!) 142/80 (BP Location: Left Arm)   Pulse 86   Temp 98.2 F (36.8 C) (Oral)   Resp 18   SpO2 100%   Physical Exam Vitals signs and nursing note reviewed.  Constitutional:      Appearance: He is well-developed. He is obese.  HENT:     Head: Normocephalic and atraumatic.     Right Ear: External ear normal.     Left Ear: External ear normal.     Nose: Nose normal.  Eyes:     General:        Right eye: No discharge.        Left eye: No discharge.  Neck:     Musculoskeletal: Neck supple.  Cardiovascular:     Rate and Rhythm: Normal rate and regular rhythm.     Heart sounds: Normal heart sounds.  Pulmonary:     Effort: Pulmonary effort is normal.     Breath sounds: Normal breath sounds.  Abdominal:     Palpations: Abdomen is soft.     Tenderness: There is no abdominal  tenderness.  Musculoskeletal:     Right lower leg: Edema present.     Left lower leg: Edema present.     Comments: Mild pitting edema to BLE  Skin:    General: Skin is warm and  dry.  Neurological:     Mental Status: He is alert.  Psychiatric:        Mood and Affect: Mood is not anxious.      ED Treatments / Results  Labs (all labs ordered are listed, but only abnormal results are displayed) Labs Reviewed  BASIC METABOLIC PANEL - Abnormal; Notable for the following components:      Result Value   Glucose, Bld 138 (*)    All other components within normal limits  TROPONIN I (HIGH SENSITIVITY) - Abnormal; Notable for the following components:   Troponin I (High Sensitivity) 41 (*)    All other components within normal limits  CBC  BRAIN NATRIURETIC PEPTIDE  TROPONIN I (HIGH SENSITIVITY)    EKG EKG Interpretation  Date/Time:  Thursday November 01 2019 11:53:36 EST Ventricular Rate:  103 PR Interval:  176 QRS Duration: 100 QT Interval:  328 QTC Calculation: 429 R Axis:   -45 Text Interpretation: Sinus tachycardia Low voltage QRS Left anterior fascicular block Inferior infarct , age undetermined Anterolateral infarct , age undetermined Abnormal ECG rate is faster, otherwise similar to June 2020 Confirmed by Sherwood Gambler [phone]) on 11/01/2019 3:25:38 PM   Radiology Dg Chest 2 View  Result Date: 11/01/2019 CLINICAL DATA:  Chest pain. EXAM: CHEST - 2 VIEW COMPARISON:  09/18/2017. FINDINGS: Mediastinum hilar structures normal. Lungs are clear. No pleural effusion or pneumothorax. Heart size normal. Degenerative change thoracic spine. Old thoracic spine compression fractures noted. No acute bony abnormality. IMPRESSION: No acute cardiopulmonary disease. Electronically Signed   By: Marcello Moores  Register   On: 11/01/2019 12:18    Procedures Procedures (including critical care time)  Medications Ordered in ED Medications  aspirin chewable tablet 324 mg (has no administration in time range)  nitroGLYCERIN (NITROSTAT) SL tablet 0.4 mg (has no administration in time range)     Initial Impression / Assessment and Plan / ED Course  I have reviewed the  triage vital signs and the nursing notes.  Pertinent labs & imaging results that were available during my care of the patient were reviewed by me and considered in my medical decision making (see chart for details).        Patient has some current chest pain. First troponin is not diagnostic, will need serial.  I have consulted cardiology who is aware of patient and plans to admit. Advised to hold on heparin until they see.   Final Clinical Impressions(s) / ED Diagnoses   Final diagnoses:  Unstable angina [facility])    ED Discharge Orders    None       Sherwood Gambler, MD 11/01/19 1627

## 2019-11-01 NOTE — H&P (Addendum)
Cardiology Admission History and Physical:   Patient ID: Jayten Gabbard Digman MRN: [id number]; DOB: June 29, 1971   Admission date: 11/01/2019  Primary Care Provider: [name], NP Primary Cardiologist: Skeet Latch, MD  Primary Electrophysiologist:  None   Chief Complaint:  Chest pain  Patient Profile:   Jadavion Sp[address]stra Verstraete is a 48 y.o. male with PMH of CAD s/p DES x1 to RCA (6/20), HTN, DM, HL PE (on Xarelto), DVT, COPD, depression and tobacco use who presented to the office with chest pain.   History of Present Illness:   Mr. Dragone is a 48 yo male with PMH noted above. He is followed by Dr. Oval Linsey as an outpatient.  He is on chronic Xarelto after a PE in 2014.  At that time he had weighed over 600 pounds.  He had a BMS placed to his dLCx back in 2017. Echo at that time showed EF of 55-60% with no WMA, and G1DD.   In March 2020 he presented to the clinic with complaints of chest pain and worsening lower extremity edema.  An echocardiogram and Myoview were obtained but this was delayed secondary to COVID-19 pandemic.  He was given low-dose nitrate along with low-dose carvedilol.  Unfortunately cannot tolerate nitrate therapy secondary to headache.  Myoview was completed on 05/23/2019 and was abnormal noting a an LVEF of 40%.  Underwent diagnostic cardiac cath showing previous circumflex stent patent however with distal 90% stenosis, along with new mid RCA lesion of 80% which was treated with PCI/DES.  90% lesion in the distal circumflex was managed medically.  He was placed on Plavix, but no aspirin given his need for Xarelto.  He was seen again through virtual visit on 06/13/2019 and reported doing well overall.  Still complained of lower extremity edema and hydrochlorothiazide 2.5 mg 3 times a week was added.  He presented to the clinic on 11/01/2019 with complaints of intermittent chest pain for the past 3 weeks.  These episodes have been with both rest and activity.  Had taken several  nitroglycerin with each of these episodes which relieved his pain.  Describes symptoms as a heaviness in his chest with radiation down to his left arm.  States that these episodes were actually worse than what he experienced back in June prior to his cardiac cath.  Also complained of worsening lower extremity edema.  States he does not weigh himself regularly or monitor his blood pressure.  Unfortunately has missed intermittent doses of his Plavix as well.  Does not stick to heart healthy or low-sodium diet.  Decision was made to send him to the ER for further evaluation and possible invasive work-up.  In the ED his labs showed stable electrolytes, creatinine 1.2, high-sensitivity troponin 41.  Chest x-ray showed no acute edema.  EKG showed sinus tachycardia with left anterior fascicular block and prior anterior lateral/inferior infarct.  Currently pain-free at the time of assessment.  Of note he does have several ulcers/wound on his lower extremities.   Heart Pathway Score:     Past Medical History:  Diagnosis Date  . Anxiety   . Arthritis    "knees, shoulders, hips, ankles" (07/29/2016)  . Asthma   . CAD (coronary artery disease)    a. 2017: s/p BMS to distal Cx; b. LHC 05/30/2019: 80% mid, distal RCA s/p DES, 30% narrowing of d LM, widely patent LAD w/ luminal irregularities, widely patent stent in [place]  w/ 90+% stenosis distal to stent beofre small trifurcating obtuse marginal (potentially area of  restenosis)  . Chronic bronchitis ([place])   . Chronic ulcer of right great toe ([place]) 09/19/2017  . COPD (chronic obstructive pulmonary disease) ([place])   . Depression   . DVT (deep venous thrombosis) ([place])   . GERD (gastroesophageal reflux disease)   . Hyperlipidemia   . Hypertension   . Myocardial infarction ([place]) 1996   "light one"  . PE (pulmonary embolism) 04/2013   On chronic Xarelto  . Peripheral nerve disease   . Pneumonia "several times"  . Type II diabetes mellitus ([place])     Past  Surgical History:  Procedure Laterality Date  . AMPUTATION Right 09/21/2017   Procedure: RIGHT GREAT TOE AMPUTATION, POSSIBLE VAC;  Surgeon: Leandrew Koyanagi, MD;  Location: [facility];  Service: Orthopedics;  Laterality: Right;  . CARDIAC CATHETERIZATION  2006  . CARDIAC CATHETERIZATION  1996   "@ [facility]; when I had my heart attack"  . CARDIAC CATHETERIZATION N/A 07/29/2016   Procedure: Left Heart Cath and Coronary Angiography;  Surgeon: Lorretta Harp, MD;  Location: [facility];  Service: Cardiovascular;  Laterality: N/A;  . CARDIAC CATHETERIZATION N/A 07/29/2016   Procedure: Coronary Stent Intervention;  Surgeon: Lorretta Harp, MD;  Location: [facility];  Service: Cardiovascular;  Laterality: N/A;  . CARPAL TUNNEL RELEASE Bilateral   . CORONARY ANGIOPLASTY WITH STENT PLACEMENT  07/29/2016  . CORONARY STENT INTERVENTION N/A 05/30/2019   Procedure: CORONARY STENT INTERVENTION;  Surgeon: Belva Crome, MD;  Location: [facility];  Service: Cardiovascular;  Laterality: N/A;  . ESOPHAGOGASTRODUODENOSCOPY N/A 09/22/2017   Procedure: ESOPHAGOGASTRODUODENOSCOPY (EGD);  Surgeon: Ladene Artist, MD;  Location[facility] END[facility];  Service: Endoscopy;  Laterality: N/A;  . KNEE ARTHROSCOPY Bilateral    "2 on left; 1 on the right"  . RIGHT/LEFT HEART CATH AND CORONARY ANGIOGRAPHY N/A 05/30/2019   Procedure: RIGHT/LEFT HEART CATH AND CORONARY ANGIOGRAPHY;  Surgeon: Belva Crome, MD;  Location: [facility];  Service: Cardiovascular;  Laterality: N/A;  . SHOULDER OPEN ROTATOR CUFF REPAIR Bilateral      Medications Prior to Admission: Prior to Admission medications   Medication Sig Start Date End Date Taking? Authorizing Provider  albuterol (PROAIR HFA) 108 (90 BASE) MCG/ACT inhaler Inhale 2 puffs into the lungs every 6 (six) hours as needed for wheezing or shortness of breath.     [provider]  ALPRAZolam Duanne Moron) 1 MG tablet Take 1-2 mg by mouth 4 (four) times daily as  needed (anxiety).     [provider]  amLODipine (NORVASC) 2.5 MG tablet Take 2.5 mg by mouth daily.     [provider]  atorvastatin (LIPITOR) 80 MG tablet Take 1 tablet (80 mg total) by mouth daily at 6 PM. 05/31/19   Sande Rives E, PA-C  blood glucose meter kit and supplies KIT Dispense based on patient and insurance preference. Use up to four times daily as directed. (FOR ICD-9 250.00, 250.01). 09/23/17   Hongalgi, Lenis Dickinson, MD  buPROPion (WELLBUTRIN SR) 150 MG 12 hr tablet Take 150 mg by mouth 2 (two) times daily.  08/17/16   [provider]  carvedilol (COREG) 6.25 MG tablet Take 1 tablet (6.25 mg total) by mouth 2 (two) times daily. 05/31/19   Darreld Mclean, PA-C  clopidogrel (PLAVIX) 75 MG tablet Take 1 tablet (75 mg total) by mouth daily. Please schedule yearly appointment for more refills, thank you! 1st attempt 05/31/19   Darreld Mclean, PA-C  fenofibrate [facility])  145 MG tablet Take 145 mg by mouth daily.     [provider]  fluconazole (DIFLUCAN) 100 MG tablet Take 100 mg by mouth every Monday.    [provider]  fluticasone (FLONASE) 50 MCG/ACT nasal spray Place 2 sprays into both nostrils daily as needed for allergies. 03/14/19   [provider]  HUMALOG KWIKPEN 100 UNIT/ML SOPN Inject 20-45 Units into the skin 3 (three) times daily with meals. sliding scale 05/02/13   [provider]  hydrochlorothiazide (MICROZIDE) 12.5 MG capsule Take 1 tablet on Monday, Wednesday and Friday  prn edema 06/13/19   Erlene Quan, PA-C  LANTUS SOLOSTAR 100 UNIT/ML Solostar Pen Inject 70 Units into the skin 2 (two) times daily.  12/01/14   [provider]  lisinopril (ZESTRIL) 5 MG tablet Take 5 mg by mouth daily.    [provider]  meclizine (ANTIVERT) 25 MG tablet Take 25-50 mg by mouth See admin instructions. Take 2 tablets (50 mg) by mouth in the morning & take 1 tablet (25 mg) by mouth at night. 04/10/13    [provider]  metFORMIN (GLUCOPHAGE) 1000 MG tablet Take 1,000 mg by mouth 2 (two) times daily with a meal.      [provider]  nitroGLYCERIN (NITROSTAT) 0.4 MG SL tablet Place 1 tablet (0.4 mg total) under the tongue every 5 (five) minutes as needed for chest pain. 03/08/19   Erlene Quan, PA-C  oxyCODONE (ROXICODONE) 15 MG immediate release tablet Take 15 mg by mouth 5 (five) times daily.     [provider]  pantoprazole (PROTONIX) 40 MG tablet Take 1 tablet (40 mg total) by mouth daily. Patient taking differently: Take 80 mg by mouth daily.  09/23/17   Hongalgi, Lenis Dickinson, MD  pregabalin (LYRICA) 100 MG capsule Take 150 mg by mouth 3 (three) times daily. Take 2 capsules (200 mg) by mouth in the morning & take 1 capsule (100 mg) by mouth at night. 05/06/19   [provider]  promethazine (PHENERGAN) 25 MG tablet Take 50 mg by mouth every 6 (six) hours as needed for nausea.     [provider]  Rivaroxaban (XARELTO) 20 MG TABS Take 1 tablet (20 mg total) by mouth daily. Patient taking differently: Take 20 mg by mouth daily. Take with food 05/28/13   Radene Gunning, NP  VICTOZA 18 MG/3ML SOPN Inject 1.2 mg into the skin at bedtime. 04/30/19   [provider]     Allergies:    Allergies  Allergen Reactions  . Sulfa Antibiotics Other (See Comments)    headache    Social History:   Social History   Socioeconomic History  . Marital status: Married    Spouse name: Not on file  . Number of children: Not on file  . Years of education: Not on file  . Highest education level: Not on file  Occupational History  . Occupation: disabled     Fish farm manager: UNEMPLOYED  Social Needs  . Financial resource strain: Not on file  . Food insecurity    Worry: Not on file    Inability: Not on file  . Transportation needs    Medical: Not on file    Non-medical: Not on file  Tobacco Use  . Smoking status: Current Every Day Smoker    Packs/day: 1.00     Years: 34.00    Pack years: 34.00    Types: Cigarettes  . Smokeless tobacco: Former Systems developer  Types: Snuff, Chew  . Tobacco comment: 8/910/2017 "3 ppd before 2015"  Substance and Sexual Activity  . Alcohol use: Yes    Comment: RARE  . Drug use: No  . Sexual activity: Yes  Lifestyle  . Physical activity    Days per week: Not on file    Minutes per session: Not on file  . Stress: Not on file  Relationships  . Social Herbalist on phone: Not on file    Gets together: Not on file    Attends religious service: Not on file    Active member of club or organization: Not on file    Attends meetings of clubs or organizations: Not on file    Relationship status: Not on file  . Intimate partner violence    Fear of current or ex partner: Not on file    Emotionally abused: Not on file    Physically abused: Not on file    Forced sexual activity: Not on file  Other Topics Concern  . Not on file  Social History Narrative  . Not on file    Family History:   The patient's family history includes Diabetes in an other family member; Hyperlipidemia in an other family member; Hypertension in his brother and father.    ROS:  Please see the history of present illness.  All other ROS reviewed and negative.     Physical Exam/Data:   Vitals:   11/01/19 1148 11/01/19 1427  BP: (!) 141/95 (!) 142/80  Pulse: 100 86  Resp: 16 18  Temp: 98.2 F (36.8 C)   TempSrc: Oral   SpO2: 100% 100%   No intake or output data in the 24 hours ending 11/01/19 1701 Last 3 Weights 11/01/2019 05/31/2019 05/30/2019  Weight (lbs) 389 lb 338 lb 3.2 oz 327 lb  Weight (kg) 176.449 kg 153.407 kg 148.326 kg     There is no height or weight on file to calculate BMI.  General:  Well nourished, well developed, obese older WM, in no acute distress HEENT: normal Lymph: no adenopathy Neck: no JVD Endocrine:  No thryomegaly Vascular: No carotid bruits Cardiac:  normal S1, S2; RRR; no murmur  Lungs:  clear to  auscultation bilaterally, expiratory wheezing, rhonchi or rales  Abd: soft, nontender, no hepatomegaly  Ext: 2+ pitting LE edema, small ulcers to left ankle area. Musculoskeletal:  No deformities, BUE and BLE strength normal and equal Skin: warm and dry  Neuro:  CNs 2-12 intact, no focal abnormalities noted Psych:  Normal affect    EKG:  The ECG that was done 11/01/19 was personally reviewed and demonstrates sinus tachycardia with left anterior fascicular block and prior anterior lateral/inferior infarct.  Relevant CV Studies:  TTE: 8/17  Study Conclusions  - Left ventricle: The cavity size was normal. Wall thickness was   increased in a pattern of mild LVH. Systolic function was normal.   The estimated ejection fraction was in the range of 55% to 60%.   Wall motion was normal; there were no regional wall motion   abnormalities. Doppler parameters are consistent with abnormal   left ventricular relaxation (grade 1 diastolic dysfunction). - Aortic root: The aortic root was mildly dilated.  Impressions:  - Technically difficult; definity used; normal LV systolic   function; grade 1 diastolic dysfunction; indeterminant filling   pressure; fusion of left and right aortic cusps; no AS or AI by   doppler; mildly dilated aortic root.     Laboratory  Data:  High Sensitivity Troponin:   Recent Labs  Lab 11/01/19 1203  TROPONINIHS 41*      Chemistry Recent Labs  Lab 11/01/19 1203  NA 141  K 4.4  CL 106  CO2 24  GLUCOSE 138*  BUN 18  CREATININE 1.20  CALCIUM 9.3  GFRNONAA >60  GFRAA >60  ANIONGAP 11    No results for input(s): PROT, ALBUMIN, AST, ALT, ALKPHOS, BILITOT in the last 168 hours. Hematology Recent Labs  Lab 11/01/19 1203  WBC 9.4  RBC 4.67  HGB 13.5  HCT 43.4  MCV 92.9  MCH 28.9  MCHC 31.1  RDW 13.1  PLT 250   BNPNo results for input(s): BNP, PROBNP in the last 168 hours.  DDimer No results for input(s): DDIMER in the last 168 hours.    Radiology/Studies:  Dg Chest 2 View  Result Date: 11/01/2019 CLINICAL DATA:  Chest pain. EXAM: CHEST - 2 VIEW COMPARISON:  09/18/2017. FINDINGS: Mediastinum hilar structures normal. Lungs are clear. No pleural effusion or pneumothorax. Heart size normal. Degenerative change thoracic spine. Old thoracic spine compression fractures noted. No acute bony abnormality. IMPRESSION: No acute cardiopulmonary disease. Electronically Signed   By: Marcello Moores  Register   On: 11/01/2019 12:18    Assessment and Plan:   Wael Maestas Pentecost is a 48 y.o. male with PMH of CAD s/p DES x1 to RCA (6/20), HTN, DM, HL PE (on Xarelto), DVT, COPD, depression and tobacco use who presented to the office with chest pain.   1.  Unstable angina: Has had several weeks of intermittent chest pain relieved with SL nitro.  States he has missed several doses of his Plavix since undergoing PCI back in June.  Episodes are happening more often.  Seen in the office today and sent to the ED for further evaluation. Does have lesion distal to prior LCx noted on last cath with plans to treat medically. HsT 41. Given symptoms and known CAD will plan for cardiac cath tomorrow afternoon.  -- The patient understands that risks included but are not limited to stroke (1 in 1000), death (1 in 33), kidney failure [usually temporary] (1 in 500), bleeding (1 in 200), allergic reaction [possibly serious] (1 in 200).   2. HTN: reports he does not check his blood pressure at all. Slightly elevated in the ED. Will continue home medications but hold lisinopril  3. HL: on high dose statin -- check lipids  4. DM: will hold metformin -- SSI -- check Hgb A1c  5. LE edema: reports this has worsened over the past couple of weeks.  -- echo -- consider diuresis after cath  6. PE/DVT: on Xarelto. Will hold with plans for cath.   7. Tobacco use: cessation advised.   Severity of Illness: The appropriate patient status for this patient is OBSERVATION.  Observation status is judged to be reasonable and necessary in order to provide the required intensity of service to ensure the patient's safety. The patient's presenting symptoms, physical exam findings, and initial radiographic and laboratory data in the context of their medical condition is felt to place them at decreased risk for further clinical deterioration. Furthermore, it is anticipated that the patient will be medically stable for discharge from the hospital within 2 midnights of admission. The following factors support the patient status of observation.   " The patient's presenting symptoms include chest pain, LE edema. " The physical exam findings include 2+ LE edema. " The initial radiographic and laboratory data are HsT 41.  For questions or updates, please contact [facility] Please consult [email or web address] for contact info under    Signed, Reino Bellis, NP  11/01/2019 5:01 PM  As above, patient seen and examined.  Briefly he is a 48 year old male with past medical history of diabetes mellitus, hypertension, hyperlipidemia, tobacco abuse, obstructive sleep apnea, coronary artery disease with unstable angina.  Patient has had previous PCI of his circumflex in 2017.  In June 2020 he had cardiac catheterization which revealed an 80% right coronary artery, patent stent in the circumflex with a 90% lesion distal to the stent.  Patient had PCI of the right coronary artery at that time.  Symptoms improved transiently but he continued to have exertional chest pain.  Over the past 3 weeks his chest pain is worsened.  It is in the left breast area radiating to the left upper extremity.  Occurs both with exertion and at rest.  Relieved with nitroglycerin.  Presently pain-free.  Also notes increased bilateral lower extremity edema for 1 week.  Denies dyspnea. On examination he has 2+ edema in his lower extremities bilaterally.  BNP 43.  Creatinine 1.2.  Hemoglobin 13.5.  Electrocardiogram shows  sinus rhythm, anterior and inferior infarct.  Chest x-ray negative.  1 unstable angina-patient has had progressive angina both with exertion and now at rest.  Presently pain-free.  We will hold Xarelto.  Proceed with cardiac catheterization tomorrow.  The risks and benefits including myocardial infarction, CVA and death discussed and he agrees to proceed.  Hold Glucophage for 48 hours following procedure.  Schedule echocardiogram to assess LV function.  We will treat with aspirin, Plavix and continue statin.  Continue carvedilol.  2 Lower ext edema-check echo for LV function; will diurese following catheterization.  3 hyperlipidemia-continue statin and tricor.  4 Tobacco abuse-pt counseled on discontinuing.  5 H/O DVT/PE-hold xarelto prior to cath and resume after.  6 Hypertension-continue preadmission meds.  Kirk Ruths MD

## 2019-11-01 NOTE — Patient Instructions (Addendum)
Medication Instructions:  Your physician recommends that you continue on your current medications as directed. Please refer to the Current Medication list given to you today.  *If you need a refill on your cardiac medications before your next appointment, please call your pharmacy*   Follow-Up:  PLEASE GO TO [facility]. WE HAVE INFORMED THEM YOU ARE ON YOUR WAY.

## 2019-11-01 NOTE — Progress Notes (Signed)
Cardiology Clinic Note   Patient Name: Joseph [name] Date of Encounter: 11/01/2019  Primary Care Provider:  [name], NP Primary Cardiologist:  Skeet Latch, MD  Patient Profile    Joseph [name]. [place] 48 year old male presents today for follow-up of his coronary artery disease, obstructive sleep apnea, dyslipidemia, and chest pain.  Past Medical History    Past Medical History:  Diagnosis Date   Anxiety    Arthritis    "knees, shoulders, hips, ankles" (07/29/2016)   Asthma    CAD (coronary artery disease)    a. 2017: s/p BMS to distal Cx; b. LHC 05/30/2019: 80% mid, distal RCA s/p DES, 30% narrowing of d LM, widely patent LAD w/ luminal irregularities, widely patent stent in [place]  w/ 90+% stenosis distal to stent beofre small trifurcating obtuse marginal (potentially area of restenosis)   Chronic bronchitis (HCC)    Chronic ulcer of right great toe ([place]) 09/19/2017   COPD (chronic obstructive pulmonary disease) (HCC)    Depression    DVT (deep venous thrombosis) (HCC)    GERD (gastroesophageal reflux disease)    Hyperlipidemia    Hypertension    Myocardial infarction ([place]) 1996   "light one"   PE (pulmonary embolism) 04/2013   On chronic Xarelto   Peripheral nerve disease    Pneumonia "several times"   Type II diabetes mellitus ([place])    Past Surgical History:  Procedure Laterality Date   AMPUTATION Right 09/21/2017   Procedure: RIGHT GREAT TOE AMPUTATION, POSSIBLE VAC;  Surgeon: Leandrew Koyanagi, MD;  Location: [place];  Service: Orthopedics;  Laterality: Right;   CARDIAC CATHETERIZATION  2006   [place]   "@ [facility]; when I had my heart attack"   CARDIAC CATHETERIZATION N/A 07/29/2016   Procedure: Left Heart Cath and Coronary Angiography;  Surgeon: Lorretta Harp, MD;  Location: [place];  Service: Cardiovascular;  Laterality: N/A;   CARDIAC CATHETERIZATION N/A 07/29/2016   Procedure: Coronary Stent  Intervention;  Surgeon: Lorretta Harp, MD;  Location: [place] [facility];  Service: Cardiovascular;  Laterality: N/A;   CARPAL TUNNEL RELEASE Bilateral    CORONARY ANGIOPLASTY WITH STENT PLACEMENT  07/29/2016   CORONARY STENT INTERVENTION N/A 05/30/2019   Procedure: CORONARY STENT INTERVENTION;  Surgeon: Belva Crome, MD;  Location: [facility];  Service: Cardiovascular;  Laterality: N/A;   ESOPHAGOGASTRODUODENOSCOPY N/A 09/22/2017   Procedure: ESOPHAGOGASTRODUODENOSCOPY (EGD);  Surgeon: Ladene Artist, MD;  Location: [facility];  Service: Endoscopy;  Laterality: N/A;   KNEE ARTHROSCOPY Bilateral    "2 on left; 1 on the right"   RIGHT/LEFT HEART CATH AND CORONARY ANGIOGRAPHY N/A 05/30/2019   Procedure: RIGHT/LEFT HEART CATH AND CORONARY ANGIOGRAPHY;  Surgeon: Belva Crome, MD;  Location: [place] [facility];  Service: Cardiovascular;  Laterality: N/A;   SHOULDER OPEN ROTATOR CUFF REPAIR Bilateral     Allergies  Allergies  Allergen Reactions   Sulfa Antibiotics Other (See Comments)    headache    History of Present Illness    [name] had a history of normal coronary arteries in 2006.  He has multiple comorbidities including insulin-dependent diabetes, hypertension, hyperlipidemia, sleep apnea, chronic pain, prior pulmonary embolism in 2014 on chronic Xarelto, smoking and obesity.  At one time he weighed over 600 pounds.  In 2017 he was evaluated for chest pain.  He underwent cardiac catheterization which showed an 80% lesion in his distal circumflex.  The lesion was treated with  a bare-metal stent.  He had no other significant CAD at that time.  An echocardiogram in 07/2016 showed an LVEF of 55 to 60% with mild LVH.  In March 2020 he presented to the clinic with complaints of chest pain and lower extremity edema.  Low-dose carvedilol was added to his medication regimen and he was not noted to have any lower extremity edema on exam.  An echocardiogram and Myoview were  ordered but were delayed due to the COVID-19 pandemic.  He was given low-dose nitrate but could not tolerate the therapy due to headache.  A Myoview was completed on 05/23/2019 and was abnormal with new LVEF of 40%.  He underwent a diagnostic cardiac catheterization on 05/30/2019.  His catheterization showed his previous circumflex stent was patent however, he had distal 90% stenosis and medical management was recommended.  He also had a 80% mid RCA lesion that was treated with PCI/DES.  He was last seen by Kerin Ransom, PA-C via virtual platform on 06/13/2019.  He had no complaints of chest pain but still indicated he had lower extremity edema.  HCTZ 2.5 mg 3 times per week was prescribed.  He presents the clinic today and states he has noticed intermittent chest pain for the last 3 weeks.  He states that on Monday and Tuesday he had chest pain both at rest and with activity.  He states that this chest pain was relieved by nitroglycerin.  He states he had to take 3 nitroglycerin for his chest pain to stop and that it lasted around 20 minutes with each episode.  He describes the pain as heavy pressure on his chest that radiates down his left arm.  He states that the chest pain he had with these 2 most recent episodes was very similar to the chest pain he had in June with his stent placement but has been worse.  When asked about when he last had chest pain he stated" this morning at 2 AM".  He did not take nitroglycerin this morning and states he went back to sleep.  He is currently not having chest pain.  His weight has increased about 50 pounds since he was last seen in June.  He states he does not weigh himself daily or monitor his blood pressure regularly.  He eats convenience foods such as fast food regularly.  He states he is compliant with his medications.  I had a long conversation with both the patient and his wife about having a strict low-sodium diet, medication compliance, physical activity, and his  comorbidities.  I have discussed the case with the DOD in clinic and will send him to the [facility] ED to be evaluated for ACS.  I have alerted the cardiology team that is on today.  He denies  fatigue, palpitations, melena, hematuria, hemoptysis, diaphoresis, weakness, presyncope, and syncope.   Home Medications    Prior to Admission medications   Medication Sig Start Date End Date Taking? Authorizing Provider  albuterol (PROAIR HFA) 108 (90 BASE) MCG/ACT inhaler Inhale 2 puffs into the lungs every 6 (six) hours as needed for wheezing or shortness of breath.     [provider]  ALPRAZolam Duanne Moron) 1 MG tablet Take 1-2 mg by mouth 4 (four) times daily as needed (anxiety).     [provider]  amLODipine (NORVASC) 2.5 MG tablet Take 2.5 mg by mouth daily.     [provider]  atorvastatin (LIPITOR) 80 MG tablet Take 1 tablet (80 mg total) by  mouth daily at 6 PM. 05/31/19   Sande Rives E, PA-C  blood glucose meter kit and supplies KIT Dispense based on patient and insurance preference. Use up to four times daily as directed. (FOR ICD-9 250.00, 250.01). 09/23/17   Hongalgi, Lenis Dickinson, MD  buPROPion (WELLBUTRIN SR) 150 MG 12 hr tablet Take 150 mg by mouth 2 (two) times daily.  08/17/16   [provider]  carvedilol (COREG) 6.25 MG tablet Take 1 tablet (6.25 mg total) by mouth 2 (two) times daily. 05/31/19   Darreld Mclean, PA-C  clopidogrel (PLAVIX) 75 MG tablet Take 1 tablet (75 mg total) by mouth daily. Please schedule yearly appointment for more refills, thank you! 1st attempt 05/31/19   Darreld Mclean, PA-C  fenofibrate (TRICOR) 145 MG tablet Take 145 mg by mouth daily.     [provider]  fluconazole (DIFLUCAN) 100 MG tablet Take 100 mg by mouth every Monday.    [provider]  fluticasone (FLONASE) 50 MCG/ACT nasal spray Place 2 sprays into both nostrils daily as needed for allergies. 03/14/19   [provider]  HUMALOG KWIKPEN 100  UNIT/ML SOPN Inject 20-45 Units into the skin 3 (three) times daily with meals. sliding scale 05/02/13   [provider]  hydrochlorothiazide (MICROZIDE) 12.5 MG capsule Take 1 tablet on Monday, Wednesday and Friday  prn edema 06/13/19   Erlene Quan, PA-C  LANTUS SOLOSTAR 100 UNIT/ML Solostar Pen Inject 70 Units into the skin 2 (two) times daily.  12/01/14   [provider]  lisinopril (ZESTRIL) 5 MG tablet Take 5 mg by mouth daily.    [provider]  meclizine (ANTIVERT) 25 MG tablet Take 25-50 mg by mouth See admin instructions. Take 2 tablets (50 mg) by mouth in the morning & take 1 tablet (25 mg) by mouth at night. 04/10/13   [provider]  metFORMIN (GLUCOPHAGE) 1000 MG tablet Take 1,000 mg by mouth 2 (two) times daily with a meal.      [provider]  nitroGLYCERIN (NITROSTAT) 0.4 MG SL tablet Place 1 tablet (0.4 mg total) under the tongue every 5 (five) minutes as needed for chest pain. 03/08/19   Erlene Quan, PA-C  oxyCODONE (ROXICODONE) 15 MG immediate release tablet Take 15 mg by mouth 5 (five) times daily.     [provider]  pantoprazole (PROTONIX) 40 MG tablet Take 1 tablet (40 mg total) by mouth daily. Patient taking differently: Take 80 mg by mouth daily.  09/23/17   Hongalgi, Lenis Dickinson, MD  pregabalin (LYRICA) 100 MG capsule Take 100-200 mg by mouth See admin instructions. Take 2 capsules (200 mg) by mouth in the morning & take 1 capsule (100 mg) by mouth at night. 05/06/19   [provider]  promethazine (PHENERGAN) 25 MG tablet Take 50 mg by mouth every 6 (six) hours as needed for nausea.     [provider]  Rivaroxaban (XARELTO) 20 MG TABS Take 1 tablet (20 mg total) by mouth daily. Patient taking differently: Take 20 mg by mouth daily. Take with food 05/28/13   Radene Gunning, NP  VICTOZA 18 MG/3ML SOPN Inject 1.2 mg into the skin at bedtime. 04/30/19   [provider]    Family History    Family  History  Problem Relation Age of Onset   Hypertension Brother    Hypertension Father    Diabetes Other    Hyperlipidemia Other    He indicated that his  mother is alive. He indicated that his father is alive. He indicated that his brother is alive. He indicated that the status of his other is unknown.  Social History    Social History   Socioeconomic History   Marital status: Married    Spouse name: Not on file   Number of children: Not on file   Years of education: Not on file   Highest education level: Not on file  Occupational History   Occupation: disabled     Fish farm manager: UNEMPLOYED  Social Designer, fashion/clothing strain: Not on file   Food insecurity    Worry: Not on file    Inability: Not on file   Transportation needs    Medical: Not on file    Non-medical: Not on file  Tobacco Use   Smoking status: Current Every Day Smoker    Packs/day: 1.00    Years: 34.00    Pack years: 34.00    Types: Cigarettes   Smokeless tobacco: Former Systems developer    Types: Snuff, Chew   Tobacco comment: 8/910/2017 "3 ppd before 2015"  Substance and Sexual Activity   Alcohol use: Yes    Comment: RARE   Drug use: No   Sexual activity: Yes  Lifestyle   Physical activity    Days per week: Not on file    Minutes per session: Not on file   Stress: Not on file  Relationships   Social connections    Talks on phone: Not on file    Gets together: Not on file    Attends religious service: Not on file    Active member of club or organization: Not on file    Attends meetings of clubs or organizations: Not on file    Relationship status: Not on file   Intimate partner violence    Fear of current or ex partner: Not on file    Emotionally abused: Not on file    Physically abused: Not on file    Forced sexual activity: Not on file  Other Topics Concern   Not on file  Social History Narrative   Not on file     Review of Systems    General:  No chills, fever, night  sweats or weight changes.  Cardiovascular:  No chest pain, +dyspnea on exertion, ++edema, +orthopnea, palpitations, paroxysmal nocturnal dyspnea. Dermatological: No rash, lesions/masses Respiratory: No cough,+ dyspnea Urologic: No hematuria, dysuria Abdominal:   No nausea, vomiting, diarrhea, bright red blood per rectum, melena, or hematemesis Neurologic:  No visual changes, wkns, changes in mental status. All other systems reviewed and are otherwise negative except as noted above.  Physical Exam    VS:  BP (!) 157/95    Pulse 94    Ht 6' 5"  (1.956 m)    Wt (!) 389 lb (176.4 kg)    BMI 46.13 kg/m  , BMI Body mass index is 46.13 kg/m. GEN: Well nourished, well developed, in no acute distress. HEENT: normal. Neck: Supple, no JVD, carotid bruits, or masses. Cardiac: RRR, no murmurs, rubs, or gallops. No clubbing, cyanosis, +2 bilateral pitting edema to the knee.  Radials/DP/PT 2+ and equal bilaterally.  Respiratory:  Respirations regular and unlabored, clear to auscultation bilaterally. GI: Soft, nontender, nondistended, BS + x 4. MS: no deformity or atrophy. Skin: warm and dry, no rash. Neuro:  Strength and sensation are intact. Psych: Normal affect.  Accessory Clinical Findings    ECG personally reviewed by me today-normal sinus rhythm left axis  deviation 94 bpm- No acute changes  EKG 06/01/2019 Sinus rhythm with first-degree AV block with fusion complexes left axis deviation 92 bpm  Echocardiogram 07/30/2016 Study Conclusions  - Left ventricle: The cavity size was normal. Wall thickness was   increased in a pattern of mild LVH. Systolic function was normal.   The estimated ejection fraction was in the range of 55% to 60%.   Wall motion was normal; there were no regional wall motion   abnormalities. Doppler parameters are consistent with abnormal   left ventricular relaxation (grade 1 diastolic dysfunction). - Aortic root: The aortic root was mildly  dilated.  Impressions:  - Technically difficult; definity used; normal LV systolic   function; grade 1 diastolic dysfunction; indeterminant filling   pressure; fusion of left and right aortic cusps; no AS or AI by   doppler; mildly dilated aortic root.  Assessment & Plan   1.  Coronary artery disease-no chest pain today.  Had chest pain that was nitro responsive on Monday and Tuesday requiring 3 nitros with each bout of chest pain.  Cardiac catheterization 05/30/2019 showed 80% mid RCA which was treated with a PCI and DES.  A 90% distal CFX lesion was also noted medical management. Continue amlodipine 2.5 mg daily Continue carvedilol 6.25 mg twice daily Continue lisinopril 5 mg tablet daily Continue clopidogrel 75 mg tablet daily Continue nitroglycerin 0.4 mg sublingual tablet as needed Heart healthy low-sodium diet Increase physical activity as tolerated Sent to [facility] ED for further evaluation and probable cardiac catheterization.  Case discussed with Dr. Sallyanne Kuster DOD  Lower extremity edema-+2 pitting edema up to the knee.  Weight increased from 338 to 389 pounds since June 2020 Heart healthy low-sodium diet Increase physical activity as tolerated Sent to [facility], ED for evaluation and probable IV diuresis  Essential hypertension-BP today 157/95.  Patient does not monitor blood pressure at home.  Also states he does not follow a low-sodium diet and eats convenience foods. Continue amlodipine 2.5 mg daily Continue carvedilol 6.25 mg twice daily Continue lisinopril 5 mg tablet daily Heart healthy low-sodium diet Increase physical activity as tolerated   Disposition: Sent to [facility] emergency department for further evaluation and treatment of his coronary artery disease and edema. Case discussed with Dr. Sallyanne Kuster DOD  Jossie Ng. [facility] [place] [address] Office [phone] Fax [phone]

## 2019-11-02 ENCOUNTER — Encounter (HOSPITAL_COMMUNITY)
Admission: EM | Disposition: A | Discharge status: Home / Self Care | Payer: Self-pay | Source: Ambulatory Visit | Attending: Cardiology

## 2019-11-02 ENCOUNTER — Ambulatory Visit (HOSPITAL_COMMUNITY): Admission: RE | Admit: 2019-11-02 | Payer: Medicare Other | Source: Home / Self Care | Admitting: Internal Medicine

## 2019-11-02 ENCOUNTER — Observation Stay (HOSPITAL_BASED_OUTPATIENT_CLINIC_OR_DEPARTMENT_OTHER): Payer: Medicare Other

## 2019-11-02 DIAGNOSIS — Z720 Tobacco use: Secondary | ICD-10-CM

## 2019-11-02 DIAGNOSIS — E785 Hyperlipidemia, unspecified: Secondary | ICD-10-CM

## 2019-11-02 DIAGNOSIS — R600 Localized edema: Secondary | ICD-10-CM

## 2019-11-02 DIAGNOSIS — I25110 Atherosclerotic heart disease of native coronary artery with unstable angina pectoris: Secondary | ICD-10-CM | POA: Diagnosis not present

## 2019-11-02 DIAGNOSIS — R079 Chest pain, unspecified: Secondary | ICD-10-CM | POA: Diagnosis not present

## 2019-11-02 DIAGNOSIS — I200 Unstable angina: Secondary | ICD-10-CM | POA: Diagnosis not present

## 2019-11-02 HISTORY — PX: CORONARY STENT INTERVENTION: CATH118234

## 2019-11-02 HISTORY — PX: LEFT HEART CATH AND CORONARY ANGIOGRAPHY: CATH118249

## 2019-11-02 LAB — CBC
HCT: 41.2 % (ref 39.0–52.0)
Hemoglobin: 13 g/dL (ref 13.0–17.0)
MCH: 29.1 pg (ref 26.0–34.0)
MCHC: 31.6 g/dL (ref 30.0–36.0)
MCV: 92.2 fL (ref 80.0–100.0)
Platelets: 230 10*3/uL (ref 150–400)
RBC: 4.47 MIL/uL (ref 4.22–5.81)
RDW: 13.1 % (ref 11.5–15.5)
WBC: 8.7 10*3/uL (ref 4.0–10.5)
nRBC: 0 % (ref 0.0–0.2)

## 2019-11-02 LAB — BASIC METABOLIC PANEL
Anion gap: 8 (ref 5–15)
BUN: 16 mg/dL (ref 6–20)
CO2: 24 mmol/L (ref 22–32)
Calcium: 8.7 mg/dL — ABNORMAL LOW (ref 8.9–10.3)
Chloride: 107 mmol/L (ref 98–111)
Creatinine, Ser: 1.28 mg/dL — ABNORMAL HIGH (ref 0.61–1.24)
GFR calc Af Amer: >60 mL/min (ref >60)
GFR calc non Af Amer: >60 mL/min (ref >60)
Glucose, Bld: 193 mg/dL — ABNORMAL HIGH (ref 70–99)
Potassium: 4 mmol/L (ref 3.5–5.1)
Sodium: 139 mmol/L (ref 135–145)

## 2019-11-02 LAB — GLUCOSE, CAPILLARY
Glucose-Capillary: 199 mg/dL — ABNORMAL HIGH (ref 70–99)
Glucose-Capillary: 102 mg/dL — ABNORMAL HIGH (ref 70–99)
Glucose-Capillary: 137 mg/dL — ABNORMAL HIGH (ref 70–99)
Glucose-Capillary: 241 mg/dL — ABNORMAL HIGH (ref 70–99)

## 2019-11-02 LAB — POCT ACTIVATED CLOTTING TIME
Activated Clotting Time: 263 seconds
Activated Clotting Time: 632 seconds

## 2019-11-02 LAB — ECHOCARDIOGRAM COMPLETE
Height: 77 in
Weight: 2792 oz

## 2019-11-02 LAB — LIPID PANEL
Cholesterol: 208 mg/dL — ABNORMAL HIGH (ref 0–200)
HDL: 25 mg/dL — ABNORMAL LOW (ref >40)
LDL Cholesterol: 149 mg/dL — ABNORMAL HIGH (ref 0–99)
Total CHOL/HDL Ratio: 8.3 RATIO
Triglycerides: 168 mg/dL — ABNORMAL HIGH (ref <150)
VLDL: 34 mg/dL (ref 0–40)

## 2019-11-02 LAB — HIV ANTIBODY (ROUTINE TESTING W REFLEX): HIV Screen 4th Generation wRfx: NONREACTIVE

## 2019-11-02 LAB — CBG MONITORING, ED: Glucose-Capillary: 233 mg/dL — ABNORMAL HIGH (ref 70–99)

## 2019-11-02 LAB — SARS CORONAVIRUS 2 (TAT 6-24 HRS): SARS Coronavirus 2: NEGATIVE

## 2019-11-02 LAB — HEMOGLOBIN A1C
Hgb A1c MFr Bld: 8.2 % — ABNORMAL HIGH (ref 4.8–5.6)
Mean Plasma Glucose: 188.64 mg/dL

## 2019-11-02 SURGERY — LEFT HEART CATH AND CORONARY ANGIOGRAPHY
Anesthesia: LOCAL

## 2019-11-02 MED ORDER — MIDAZOLAM HCL 2 MG/2ML IJ SOLN
INTRAMUSCULAR | Status: DC | PRN
  Administered 2019-11-02 (×2): 1 mg via INTRAVENOUS

## 2019-11-02 MED ORDER — HEPARIN (PORCINE) IN NACL 1000-0.9 UT/500ML-% IV SOLN
INTRAVENOUS | Status: DC | PRN
  Administered 2019-11-02 (×2): 500 mL

## 2019-11-02 MED ORDER — PROMETHAZINE HCL 25 MG PO TABS
25.0000 mg | ORAL_TABLET | Freq: Four times a day (QID) | ORAL | Status: DC | PRN
  Administered 2019-11-02 – 2019-11-03 (×2): 25 mg via ORAL
  Filled 2019-11-02 (×2): qty 1

## 2019-11-02 MED ORDER — VERAPAMIL HCL 2.5 MG/ML IV SOLN
INTRAVENOUS | Status: AC
  Filled 2019-11-02: qty 2

## 2019-11-02 MED ORDER — PERFLUTREN LIPID MICROSPHERE
1.0000 mL | INTRAVENOUS | Status: AC | PRN
  Administered 2019-11-02: 2 mL via INTRAVENOUS
  Filled 2019-11-02: qty 10

## 2019-11-02 MED ORDER — LIDOCAINE HCL (PF) 1 % IJ SOLN
INTRAMUSCULAR | Status: AC
  Filled 2019-11-02: qty 30

## 2019-11-02 MED ORDER — FENTANYL CITRATE (PF) 100 MCG/2ML IJ SOLN
INTRAMUSCULAR | Status: AC
  Filled 2019-11-02: qty 2

## 2019-11-02 MED ORDER — LIDOCAINE HCL (PF) 1 % IJ SOLN
INTRAMUSCULAR | Status: DC | PRN
  Administered 2019-11-02: 2 mL

## 2019-11-02 MED ORDER — HEPARIN SODIUM (PORCINE) 1000 UNIT/ML IJ SOLN
INTRAMUSCULAR | Status: AC
  Filled 2019-11-02: qty 1

## 2019-11-02 MED ORDER — SODIUM CHLORIDE 0.9% FLUSH: 3.0000 mL | INTRAVENOUS | Status: DC | PRN

## 2019-11-02 MED ORDER — FENTANYL CITRATE (PF) 100 MCG/2ML IJ SOLN
INTRAMUSCULAR | Status: DC | PRN
  Administered 2019-11-02: 25 ug via INTRAVENOUS
  Administered 2019-11-02: 50 ug via INTRAVENOUS

## 2019-11-02 MED ORDER — VERAPAMIL HCL 2.5 MG/ML IV SOLN
INTRAVENOUS | Status: DC | PRN
  Administered 2019-11-02: 10 mL via INTRA_ARTERIAL

## 2019-11-02 MED ORDER — IOHEXOL 350 MG/ML SOLN
INTRAVENOUS | Status: DC | PRN
  Administered 2019-11-02: 230 mL

## 2019-11-02 MED ORDER — SODIUM CHLORIDE 0.9% FLUSH: 3.0000 mL | Freq: Two times a day (BID) | INTRAVENOUS | Status: DC

## 2019-11-02 MED ORDER — HEPARIN (PORCINE) IN NACL 1000-0.9 UT/500ML-% IV SOLN
INTRAVENOUS | Status: AC
  Filled 2019-11-02: qty 1000

## 2019-11-02 MED ORDER — SODIUM CHLORIDE 0.9 % IV SOLN: 250.0000 mL | INTRAVENOUS | Status: DC | PRN

## 2019-11-02 MED ORDER — MIDAZOLAM HCL 2 MG/2ML IJ SOLN
INTRAMUSCULAR | Status: AC
  Filled 2019-11-02: qty 2

## 2019-11-02 MED ORDER — LABETALOL HCL 5 MG/ML IV SOLN: 10.0000 mg | INTRAVENOUS | Status: AC | PRN

## 2019-11-02 MED ORDER — ONDANSETRON HCL 4 MG/2ML IJ SOLN: 4.0000 mg | Freq: Four times a day (QID) | INTRAMUSCULAR | Status: DC | PRN

## 2019-11-02 MED ORDER — INSULIN ASPART 100 UNIT/ML ~~LOC~~ SOLN
3.0000 [IU] | Freq: Once | SUBCUTANEOUS | Status: AC
  Administered 2019-11-02: 22:00:00 3 [IU] via SUBCUTANEOUS

## 2019-11-02 MED ORDER — SODIUM CHLORIDE 0.9 % IV SOLN: INTRAVENOUS | Status: AC

## 2019-11-02 MED ORDER — HYDROMORPHONE HCL 1 MG/ML IJ SOLN
INTRAMUSCULAR | Status: DC | PRN
  Administered 2019-11-02 (×3): 0.5 mg via INTRAVENOUS

## 2019-11-02 MED ORDER — HYDROMORPHONE HCL 1 MG/ML IJ SOLN
INTRAMUSCULAR | Status: AC
  Filled 2019-11-02: qty 0.5

## 2019-11-02 MED ORDER — HEPARIN SODIUM (PORCINE) 1000 UNIT/ML IJ SOLN
INTRAMUSCULAR | Status: DC | PRN
  Administered 2019-11-02: 2000 [IU] via INTRAVENOUS
  Administered 2019-11-02: 10000 [IU] via INTRAVENOUS
  Administered 2019-11-02: 6000 [IU] via INTRAVENOUS

## 2019-11-02 SURGICAL SUPPLY — 21 items
BALLN SAPPHIRE 2.0X12 (BALLOONS) ×2
BALLOON SAPPHIRE 2.0X12 (BALLOONS) IMPLANT
CATH 5FR JL3.5 JR4 ANG PIG MP (CATHETERS) ×1 IMPLANT
CATH INFINITI 5FR JK (CATHETERS) ×1 IMPLANT
CATH LAUNCHER 6FR AL2 (CATHETERS) IMPLANT
CATH LAUNCHER 6FR EBU3.5 (CATHETERS) ×1 IMPLANT
CATH VISTA GUIDE 6FR AL1 (CATHETERS) ×1 IMPLANT
CATHETER LAUNCHER 6FR AL2 (CATHETERS) ×2
DEVICE RAD COMP TR BAND LRG (VASCULAR PRODUCTS) ×1 IMPLANT
GLIDESHEATH SLEND SS 6F .021 (SHEATH) ×1 IMPLANT
GUIDELINER 6F (CATHETERS) ×1 IMPLANT
GUIDEWIRE INQWIRE 1.5J.035X260 (WIRE) IMPLANT
INQWIRE 1.5J .035X260CM (WIRE) ×2
KIT ENCORE 26 ADVANTAGE (KITS) ×1 IMPLANT
KIT HEART LEFT (KITS) ×2 IMPLANT
PACK CARDIAC CATHETERIZATION (CUSTOM PROCEDURE TRAY) ×2 IMPLANT
STENT RESOLUTE ONYX 2.5X18 (Permanent Stent) ×1 IMPLANT
TRANSDUCER W/STOPCOCK (MISCELLANEOUS) ×2 IMPLANT
TUBING CIL FLEX 10 FLL-RA (TUBING) ×2 IMPLANT
WIRE HI TORQ WHISPER MS 190CM (WIRE) ×1 IMPLANT
WIRE RUNTHROUGH .014X180CM (WIRE) ×1 IMPLANT

## 2019-11-02 NOTE — ED Notes (Signed)
ED TO INPATIENT HANDOFF REPORT  ED Nurse Name and Phone #: Lenord CarboLlilbert, [phone]  S Name/Age/Gender Joseph [name] 48 y.o. male Room/Bed: 040C/040C  Code Status   Code Status: Full Code  Home/SNF/Other Home Patient oriented to: self, place, time and situation Is this baseline? Yes   Triage Complete: Triage complete  Chief Complaint CP  Triage Note Pt reports his doctor made him come. Pt states he has had chest pain that radiates into his left arm and wrist for 3 weeks. Pt reports BLE edema. Pt reports he has been taking 3-4 nitros a day. Pt reports taking a nitro at 2am this morning. Pt reports it is difficult to take a breath sometimes.    Allergies Allergies  Allergen Reactions  . Sulfa Antibiotics Other (See Comments)    headache    Level of Care/Admitting Diagnosis ED Disposition    ED Disposition Condition Comment   Admit  [facility] [[id number]]  Level of Care: Telemetry Cardiac [103]  Covid Evaluation: Asymptomatic Screening Protocol (No Symptoms)  Diagnosis: Unstable angina [facility](HCC) [[id number]]) [[id number]]  Admitting Physician: Lewayne BuntingRENSHAW, BRIAN S [[id number]]  Attending Physician: Lewayne BuntingRENSHAW, BRIAN S [[id number]]  PT Class (Do Not Modify): Observation [104]  PT Acc Code (Do Not Modify): Observation [10022]       B Medical/Surgery History Past Medical History:  Diagnosis Date  . Anxiety   . Arthritis    "knees, shoulders, hips, ankles" (07/29/2016)  . Asthma   . CAD (coronary artery disease)    a. 2017: s/p BMS to distal Cx; b. LHC 05/30/2019: 80% mid, distal RCA s/p DES, 30% narrowing of d LM, widely patent LAD w/ luminal irregularities, widely patent stent in dCX  w/ 90+% stenosis distal to stent beofre small trifurcating obtuse marginal (potentially area of restenosis)  . Chronic bronchitis (HCC)   . Chronic ulcer of right great toe (HCC) 09/19/2017  . COPD (chronic obstructive pulmonary disease) (HCC)   . Depression   . DVT (deep venous thrombosis)  (HCC)   . GERD (gastroesophageal reflux disease)   . Hyperlipidemia   . Hypertension   . Myocardial infarction (HCC) 1996   "light one"  . PE (pulmonary embolism) 04/2013   On chronic Xarelto  . Peripheral nerve disease   . Pneumonia "several times"  . Type II diabetes mellitus (HCC)    Past Surgical History:  Procedure Laterality Date  . AMPUTATION Right 09/21/2017   Procedure: RIGHT GREAT TOE AMPUTATION, POSSIBLE VAC;  Surgeon: Tarry KosXu, Naiping M, MD;  Location: [facility] OR;  Service: Orthopedics;  Laterality: Right;  . CARDIAC CATHETERIZATION  2006  . CARDIAC CATHETERIZATION  1996   "@ [facility]; when I had my heart attack"  . CARDIAC CATHETERIZATION N/A 07/29/2016   Procedure: Left Heart Cath and Coronary Angiography;  Surgeon: Runell GessJonathan J Berry, MD;  Location: [facility] INVAS[facility] CV LAB;  Service: Cardiovascular;  Laterality: N/A;  . CARDIAC CATHETERIZATION N/A 07/29/2016   Procedure: Coronary Stent Intervention;  Surgeon: Runell GessJonathan J Berry, MD;  Location: [facility] INVAS[facility] CV LAB;  Service: Cardiovascular;  Laterality: N/A;  . CARPAL TUNNEL RELEASE Bilateral   . CORONARY ANGIOPLASTY WITH STENT PLACEMENT  07/29/2016  . CORONARY STENT INTERVENTION N/A 05/30/2019   Procedure: CORONARY STENT INTERVENTION;  Surgeon: Lyn RecordsSmith, Henry W, MD;  [facility];  Service: Cardiovascular;  Laterality: N/A;  . ESOPHAGOGASTRODUODENOSCOPY N/A 09/22/2017   Procedure: ESOPHAGOGASTRODUODENOSCOPY (EGD);  Surgeon: Meryl DareStark, Malcolm T, MD;  [facility];  Service: Endoscopy;  Laterality: N/A;  .  KNEE ARTHROSCOPY Bilateral    "2 on left; 1 on the right"  . RIGHT/LEFT HEART CATH AND CORONARY ANGIOGRAPHY N/A 05/30/2019   Procedure: RIGHT/LEFT HEART CATH AND CORONARY ANGIOGRAPHY;  Surgeon: Lyn Records, MD;  Location: [facility];  Service: Cardiovascular;  Laterality: N/A;  . SHOULDER OPEN ROTATOR CUFF REPAIR Bilateral      A IV Location/Drains/Wounds Patient Lines/Drains/Airways Status   Active  Line/Drains/Airways    Name:   Placement date:   Placement time:   Site:   Days:   Peripheral IV 11/01/19 Anterior;Left Forearm   11/01/19    1825    Forearm   1   Incision (Closed) 09/21/17 Foot   09/21/17    1715     772   Wound / Incision (Open or Dehisced) 09/16/17 Diabetic ulcer Toe (Comment  which one) Right   09/16/17    2000    Toe (Comment  which one)   777   Wound / Incision (Open or Dehisced) 09/16/17 Diabetic ulcer Finger (Comment which one) Left   09/16/17    2000    Finger (Comment which one)   777          Intake/Output Last 24 hours No intake or output data in the 24 hours ending 11/02/19 0250  Labs/Imaging Results for orders placed or performed during the hospital encounter of 11/01/19 (from the past 48 hour(s))  Basic metabolic panel     Status: Abnormal   Collection Time: 11/01/19 12:03 PM  Result Value Ref Range   Sodium 141 135 - 145 mmol/L   Potassium 4.4 3.5 - 5.1 mmol/L   Chloride 106 98 - 111 mmol/L   CO2 24 22 - 32 mmol/L   Glucose, Bld 138 (H) 70 - 99 mg/dL   BUN 18 6 - 20 mg/dL   Creatinine, Ser 1.61 0.61 - 1.24 mg/dL   Calcium 9.3 8.9 - 09.6 mg/dL   GFR calc non Af Amer >60 >60 mL/min   GFR calc Af Amer >60 >60 mL/min   Anion gap 11 5 - 15    Comment: Performed at [facility], [address], [place], [name] [address]  CBC     Status: None   Collection Time: 11/01/19 12:03 PM  Result Value Ref Range   WBC 9.4 4.0 - 10.5 K/uL   RBC 4.67 4.22 - 5.81 MIL/uL   Hemoglobin 13.5 13.0 - 17.0 g/dL   HCT 98.1 19.1 - 47.8 %   MCV 92.9 80.0 - 100.0 fL   MCH 28.9 26.0 - 34.0 pg   MCHC 31.1 30.0 - 36.0 g/dL   RDW 29.5 62.1 - 30.8 %   Platelets 250 150 - 400 K/uL   nRBC 0.0 0.0 - 0.2 %    Comment: Performed at [facility], [address], [place], [name] [address]  Troponin I (High Sensitivity)     Status: Abnormal   Collection Time: 11/01/19 12:03 PM  Result Value Ref Range   Troponin I (High Sensitivity) 41 (H) <18 ng/L    Comment:  (NOTE) Elevated high sensitivity troponin I (hsTnI) values and significant  changes across serial measurements may suggest ACS but many other  chronic and acute conditions are known to elevate hsTnI results.  Refer to the "Links" section for chest pain algorithms and additional  guidance. Performed at [facility], [address] [place], [name] [address]   Troponin I (High Sensitivity)  Status: Abnormal   Collection Time: 11/01/19  3:56 PM  Result Value Ref Range   Troponin I (High Sensitivity) 33 (H) <18 ng/L    Comment: (NOTE) Elevated high sensitivity troponin I (hsTnI) values and significant  changes across serial measurements may suggest ACS but many other  chronic and acute conditions are known to elevate hsTnI results.  Refer to the "Links" section for chest pain algorithms and additional  guidance. Performed at [facility], [place] [address] [place], [place] [address]   Brain natriuretic peptide     Status: None   Collection Time: 11/01/19  3:56 PM  Result Value Ref Range   B Natriuretic Peptide 43.3 0.0 - 100.0 pg/mL    Comment: Performed at [facility] [address], [place], [place] [address]  SARS CORONAVIRUS 2 (TAT 6-24 HRS) Nasopharyngeal Nasopharyngeal Swab     Status: None   Collection Time: 11/01/19  6:16 PM   Specimen: Nasopharyngeal Swab  Result Value Ref Range   SARS Coronavirus 2 NEGATIVE NEGATIVE    Comment: (NOTE) SARS-CoV-2 target nucleic acids are NOT DETECTED. The SARS-CoV-2 RNA is generally detectable in upper and lower respiratory specimens during the acute phase of infection. Negative results do not preclude SARS-CoV-2 infection, do not rule out co-infections with other pathogens, and should not be used as the sole basis for treatment or other patient management decisions. Negative results must be combined with clinical observations, patient history, and epidemiological information. The expected result is Negative. Fact  Sheet for Patients: SugarRoll.be Fact Sheet for Healthcare Providers: https://www.woods-mathews.com/ This test is not yet approved or cleared by the [place] FDA and  has been authorized for detection and/or diagnosis of SARS-CoV-2 by FDA under an Emergency Use Authorization (EUA). This EUA will remain  in effect (meaning this test can be used) for the duration of the COVID-19 declaration under Section 56 4(b)(1) of the Act, 21 U.S.C. section 360bbb-3(b)(1), unless the authorization is terminated or revoked sooner. Performed at [facility], [place] [address] [place], [place] [address]   HIV Antibody (routine testing w rflx)     Status: None   Collection Time: 11/01/19  7:30 PM  Result Value Ref Range   HIV Screen 4th Generation wRfx NON REACTIVE NON REACTIVE    Comment: Performed at [facility], [place] [address] [place], [place] [address]  Creatinine, serum     Status: None   Collection Time: 11/01/19  7:30 PM  Result Value Ref Range   Creatinine, Ser 1.18 0.61 - 1.24 mg/dL   GFR calc non Af Amer >60 >60 mL/min   GFR calc Af Amer >60 >60 mL/min    Comment: Performed at [place] [address], [place], [place] [address]  Troponin I (High Sensitivity)     Status: Abnormal   Collection Time: 11/01/19  7:30 PM  Result Value Ref Range   Troponin I (High Sensitivity) 34 (H) <18 ng/L    Comment: (NOTE) Elevated high sensitivity troponin I (hsTnI) values and significant  changes across serial measurements may suggest ACS but many other  chronic and acute conditions are known to elevate hsTnI results.  Refer to the "Links" section for chest pain algorithms and additional  guidance. Performed at [facility], [place] [address], [place], [place] [address]   Hemoglobin A1c     Status: Abnormal   Collection Time: 11/01/19  7:30 PM  Result Value Ref Range   Hgb  A1c MFr Bld 8.2 (H) 4.8 - 5.6 %    Comment: (NOTE) Pre  diabetes:          5.7%-6.4% Diabetes:              >6.4% Glycemic control for   <7.0% adults with diabetes    Mean Plasma Glucose 188.64 mg/dL    Comment: Performed at [facility], [address] [place], [name] [address]  CBG monitoring, ED     Status: Abnormal   Collection Time: 11/01/19  8:42 PM  Result Value Ref Range   Glucose-Capillary 131 (H) 70 - 99 mg/dL  Troponin I (High Sensitivity)     Status: Abnormal   Collection Time: 11/01/19  9:30 PM  Result Value Ref Range   Troponin I (High Sensitivity) 33 (H) <18 ng/L    Comment: (NOTE) Elevated high sensitivity troponin I (hsTnI) values and significant  changes across serial measurements may suggest ACS but many other  chronic and acute conditions are known to elevate hsTnI results.  Refer to the "Links" section for chest pain algorithms and additional  guidance. Performed at [facility], [address] [place], [name] [address]   CBG monitoring, ED     Status: Abnormal   Collection Time: 11/02/19  1:58 AM  Result Value Ref Range   Glucose-Capillary 233 (H) 70 - 99 mg/dL   Dg Chest 2 View  Result Date: 11/01/2019 CLINICAL DATA:  Chest pain. EXAM: CHEST - 2 VIEW COMPARISON:  09/18/2017. FINDINGS: Mediastinum hilar structures normal. Lungs are clear. No pleural effusion or pneumothorax. Heart size normal. Degenerative change thoracic spine. Old thoracic spine compression fractures noted. No acute bony abnormality. IMPRESSION: No acute cardiopulmonary disease. Electronically Signed   By: Maisie Fus  Register   On: 11/01/2019 12:18    Pending Labs Unresulted Labs (From admission, onward)    Start     Ordered   11/02/19 0500  Hemoglobin A1c  Tomorrow morning,   R     11/01/19 1929   11/02/19 0500  Basic metabolic panel  Tomorrow morning,   R     11/01/19 1929   11/02/19 0500  Lipid panel  Tomorrow morning,   R     11/01/19 1929          Vitals/Pain Today's Vitals   11/01/19 2145 11/01/19 2330 11/01/19  2346 11/02/19 0238  BP: 127/88 115/79 110/68   Pulse: 86 80 81   Resp: Temp:      TempSrc:      SpO2: 97% 94% 94%   PainSc:    7     Isolation Precautions No active isolations  Medications Medications  nitroGLYCERIN (NITROSTAT) SL tablet 0.4 mg (has no administration in time range)  acetaminophen (TYLENOL) tablet 1,000 mg (0 mg Oral Hold 11/01/19 1933)  amLODipine (NORVASC) tablet 2.5 mg (2.5 mg Oral Given 11/01/19 2024)  atorvastatin (LIPITOR) tablet 80 mg (has no administration in time range)  carvedilol (COREG) tablet 6.25 mg (6.25 mg Oral Given 11/01/19 2226)  fenofibrate tablet 160 mg (160 mg Oral Given 11/01/19 2226)  buPROPion (WELLBUTRIN SR) 12 hr tablet 150 mg (150 mg Oral Given 11/01/19 2226)  pantoprazole (PROTONIX) EC tablet 40 mg (40 mg Oral Given 11/01/19 2023)  clopidogrel (PLAVIX) tablet 75 mg (75 mg Oral Given 11/01/19 2024)  aspirin EC tablet 81 mg (has no administration in time range)  acetaminophen (TYLENOL) tablet 650 mg (has no administration in time range)  ondansetron [facility]) injection 4 mg (has no administration in time range)  heparin injection 5,000 Units (5,000 Units Subcutaneous Given 11/01/19 2228)  insulin aspart (novoLOG) injection 0-15 Units (has no administration in time range)  sodium chloride flush (NS) 0.9 % injection 3 mL (0 mLs Intravenous Hold 11/01/19 2011)  sodium chloride flush (NS) 0.9 % injection 3 mL (has no administration in time range)  0.9 %  sodium chloride infusion (has no administration in time range)  0.9% sodium chloride infusion (has no administration in time range)    Followed by  0.9% sodium chloride infusion (has no administration in time range)  oxyCODONE (Oxy IR/ROXICODONE) immediate release tablet 15 mg (15 mg Oral Given 11/02/19 0238)  aspirin chewable tablet 324 mg (324 mg Oral Given 11/01/19 1703)    Mobility walks Low fall risk   Focused Assessments Cardiac Assessment Handoff:  Cardiac Rhythm:  Normal sinus rhythm Lab Results  Component Value Date   CKTOTAL 485 (H) 08/15/2013   TROPONINI <0.03 09/18/2017   Lab Results  Component Value Date   DDIMER <0.27 04/01/2014   Does the Patient currently have chest pain? No     R Recommendations: See Admitting Provider Note  Report given to:   Additional Notes: N/A

## 2019-11-02 NOTE — Interval H&P Note (Signed)
Cath Lab Visit (complete for each Cath Lab visit)  Clinical Evaluation Leading to the Procedure:   ACS: Yes.   n  Non-ACS:  n/a        History and Physical Interval Note:  11/02/2019 11:29 AM  Joseph [name]  has presented today for surgery, with the diagnosis of chest pain.  The various methods of treatment have been discussed with the patient and family. After consideration of risks, benefits and other options for treatment, the patient has consented to  Procedure(s): LEFT HEART CATH AND CORONARY ANGIOGRAPHY (N/A) as a surgical intervention.  The patient's history has been reviewed, patient examined, no change in status, stable for surgery.  I have reviewed the patient's chart and labs.  Questions were answered to the patient's satisfaction.     Kathlyn [place]

## 2019-11-02 NOTE — Plan of Care (Signed)
  Problem: Education: Goal: Knowledge of General Education information will improve Description Including pain rating scale, medication(s)/side effects and non-pharmacologic comfort measures Outcome: Progressing   

## 2019-11-02 NOTE — H&P (View-Only) (Signed)
Progress Note  Patient Name: Joseph [name] Date of Encounter: 11/02/2019  Primary Cardiologist: Skeet Latch, MD   Subjective   No dyspnea; "a little " chest pain  Inpatient Medications    Scheduled Meds: . acetaminophen  1,000 mg Oral Once  . amLODipine  2.5 mg Oral Daily  . aspirin EC  81 mg Oral Daily  . atorvastatin  80 mg Oral q1800  . buPROPion  150 mg Oral BID  . carvedilol  6.25 mg Oral BID  . clopidogrel  75 mg Oral Daily  . fenofibrate  160 mg Oral Daily  . heparin  5,000 Units Subcutaneous Q8H  . insulin aspart  0-15 Units Subcutaneous TID WC  . oxyCODONE  15 mg Oral 5 X Daily  . pantoprazole  40 mg Oral Daily  . sodium chloride flush  3 mL Intravenous Q12H   Continuous Infusions: . sodium chloride    . sodium chloride 1 mL/kg/hr (11/02/19 0639)   PRN Meds: sodium chloride, acetaminophen, nitroGLYCERIN, perflutren lipid microspheres (DEFINITY) IV suspension, promethazine, sodium chloride flush   Vital Signs    Vitals:   11/01/19 2346 11/02/19 0245 11/02/19 0324 11/02/19 0853  BP: 110/68 110/73 (!) 137/93 120/79  Pulse: 81  84 68  Resp: 15 14 14 15   Temp:   97.6 F (36.4 C) (!) 97.3 F (36.3 C)  TempSrc:   Oral Oral  SpO2: 94%   96%  Weight:   79.2 kg   Height:   6\' 5"  (1.956 m)     Intake/Output Summary (Last 24 hours) at 11/02/2019 0901 Last data filed at 11/02/2019 0623 Gross per 24 hour  Intake 0 ml  Output 500 ml  Net -500 ml   Last 3 Weights 11/02/2019 11/01/2019 11/01/2019  Weight (lbs) 174 lb 8 oz 174 lb 8 oz 389 lb  Weight (kg) 79.153 kg 79.153 kg 176.449 kg      Telemetry    Sinus - Personally Reviewed   Physical Exam   GEN: No acute distress.  Obese Neck: No JVD Cardiac: RRR, no murmurs, rubs, or gallops.  Respiratory: Clear to auscultation bilaterally. GI: Soft, nontender, non-distended  MS: No edema Neuro:  Nonfocal  Psych: Normal affect   Labs    High Sensitivity Troponin:   Recent Labs  Lab 11/01/19  1203 11/01/19 1556 11/01/19 1930 11/01/19 2130  TROPONINIHS 41* 33* 34* 33*      Chemistry Recent Labs  Lab 11/01/19 1203 11/01/19 1930 11/02/19 0507  NA 141  --  139  K 4.4  --  4.0  CL 106  --  107  CO2 24  --  24  GLUCOSE 138*  --  193*  BUN 18  --  16  CREATININE 1.20 1.18 1.28*  CALCIUM 9.3  --  8.7*  GFRNONAA >60 >60 >60  GFRAA >60 >60 >60  ANIONGAP 11  --  8     Hematology Recent Labs  Lab 11/01/19 1203 11/02/19 0507  WBC 9.4 8.7  RBC 4.67 4.47  HGB 13.5 13.0  HCT 43.4 41.2  MCV 92.9 92.2  MCH 28.9 29.1  MCHC 31.1 31.6  RDW 13.1 13.1  PLT 250 230    BNP Recent Labs  Lab 11/01/19 1556  BNP 43.3     Radiology    Dg Chest 2 View  Result Date: 11/01/2019 CLINICAL DATA:  Chest pain. EXAM: CHEST - 2 VIEW COMPARISON:  09/18/2017. FINDINGS: Mediastinum hilar structures normal. Lungs are clear. No pleural effusion  or pneumothorax. Heart size normal. Degenerative change thoracic spine. Old thoracic spine compression fractures noted. No acute bony abnormality. IMPRESSION: No acute cardiopulmonary disease. Electronically Signed   By: Maisie Fus  Register   On: 11/01/2019 12:18    Patient Profile     48 year old male with past medical history of diabetes mellitus, hypertension, hyperlipidemia, tobacco abuse, obstructive sleep apnea, coronary artery disease with unstable angina.  Patient has had previous PCI of his circumflex in 2017.  In June 2020 he had cardiac catheterization which revealed an 80% right coronary artery, patent stent in the circumflex with a 90% lesion distal to the stent.  Patient had PCI of the right coronary artery at that time. Now admitted with UA.  Echocardiogram shows normal LV systolic function with inferior lateral and apical lateral wall motion abnormality.  Assessment & Plan    1 unstable angina-enzymes not consistent with infarct.  However he has had progressive angina.  Xarelto on hold. Proceed with cardiac catheterization today.   The risks and benefits including myocardial infarction, CVA and death discussed previously and he agrees to proceed.  Hold Glucophage for 48 hours following procedure.  We will treat with aspirin, Plavix and continue statin. Continue carvedilol.  Echocardiogram shows preserved LV function with inferolateral and apical lateral wall motion abnormality.  2 Lower ext edema-Echo with preserved LV function.  Would add low-dose diuretic prior to discharge.  3 hyperlipidemia-continue statin and tricor.  4 Tobacco abuse-pt counseled on discontinuing.  5 H/O DVT/PE-hold xarelto prior to cath and resume after.  6 Hypertension-continue preadmission meds.  Blood pressure controlled this morning.  For questions or updates, please contact [facility] Please consult [email or web address] for contact info under        Signed, Olga Millers, MD  11/02/2019, 9:01 AM

## 2019-11-02 NOTE — Progress Notes (Signed)
  Echocardiogram 2D Echocardiogram with definity has been performed.  Joseph [name] 11/02/2019, 8:10 AM

## 2019-11-02 NOTE — Progress Notes (Signed)
Progress Note  Patient Name: Joseph [name] Date of Encounter: 11/02/2019  Primary Cardiologist: Skeet Latch, MD   Subjective   No dyspnea; "a little " chest pain  Inpatient Medications    Scheduled Meds: . acetaminophen  1,000 mg Oral Once  . amLODipine  2.5 mg Oral Daily  . aspirin EC  81 mg Oral Daily  . atorvastatin  80 mg Oral q1800  . buPROPion  150 mg Oral BID  . carvedilol  6.25 mg Oral BID  . clopidogrel  75 mg Oral Daily  . fenofibrate  160 mg Oral Daily  . heparin  5,000 Units Subcutaneous Q8H  . insulin aspart  0-15 Units Subcutaneous TID WC  . oxyCODONE  15 mg Oral 5 X Daily  . pantoprazole  40 mg Oral Daily  . sodium chloride flush  3 mL Intravenous Q12H   Continuous Infusions: . sodium chloride    . sodium chloride 1 mL/kg/hr (11/02/19 0639)   PRN Meds: sodium chloride, acetaminophen, nitroGLYCERIN, perflutren lipid microspheres (DEFINITY) IV suspension, promethazine, sodium chloride flush   Vital Signs    Vitals:   11/01/19 2346 11/02/19 0245 11/02/19 0324 11/02/19 0853  BP: 110/68 110/73 (!) 137/93 120/79  Pulse: 81  84 68  Resp: 15 14 14 15   Temp:   97.6 F (36.4 C) (!) 97.3 F (36.3 C)  TempSrc:   Oral Oral  SpO2: 94%   96%  Weight:   79.2 kg   Height:   6\' 5"  (1.956 m)     Intake/Output Summary (Last 24 hours) at 11/02/2019 0901 Last data filed at 11/02/2019 0623 Gross per 24 hour  Intake 0 ml  Output 500 ml  Net -500 ml   Last 3 Weights 11/02/2019 11/01/2019 11/01/2019  Weight (lbs) 174 lb 8 oz 174 lb 8 oz 389 lb  Weight (kg) 79.153 kg 79.153 kg 176.449 kg      Telemetry    Sinus - Personally Reviewed   Physical Exam   GEN: No acute distress.  Obese Neck: No JVD Cardiac: RRR, no murmurs, rubs, or gallops.  Respiratory: Clear to auscultation bilaterally. GI: Soft, nontender, non-distended  MS: No edema Neuro:  Nonfocal  Psych: Normal affect   Labs    High Sensitivity Troponin:   Recent Labs  Lab 11/01/19  1203 11/01/19 1556 11/01/19 1930 11/01/19 2130  TROPONINIHS 41* 33* 34* 33*      Chemistry Recent Labs  Lab 11/01/19 1203 11/01/19 1930 11/02/19 0507  NA 141  --  139  K 4.4  --  4.0  CL 106  --  107  CO2 24  --  24  GLUCOSE 138*  --  193*  BUN 18  --  16  CREATININE 1.20 1.18 1.28*  CALCIUM 9.3  --  8.7*  GFRNONAA >60 >60 >60  GFRAA >60 >60 >60  ANIONGAP 11  --  8     Hematology Recent Labs  Lab 11/01/19 1203 11/02/19 0507  WBC 9.4 8.7  RBC 4.67 4.47  HGB 13.5 13.0  HCT 43.4 41.2  MCV 92.9 92.2  MCH 28.9 29.1  MCHC 31.1 31.6  RDW 13.1 13.1  PLT 250 230    BNP Recent Labs  Lab 11/01/19 1556  BNP 43.3     Radiology    Dg Chest 2 View  Result Date: 11/01/2019 CLINICAL DATA:  Chest pain. EXAM: CHEST - 2 VIEW COMPARISON:  09/18/2017. FINDINGS: Mediastinum hilar structures normal. Lungs are clear. No pleural effusion  or pneumothorax. Heart size normal. Degenerative change thoracic spine. Old thoracic spine compression fractures noted. No acute bony abnormality. IMPRESSION: No acute cardiopulmonary disease. Electronically Signed   By: Maisie Fus  Register   On: 11/01/2019 12:18    Patient Profile     48 year old male with past medical history of diabetes mellitus, hypertension, hyperlipidemia, tobacco abuse, obstructive sleep apnea, coronary artery disease with unstable angina.  Patient has had previous PCI of his circumflex in 2017.  In June 2020 he had cardiac catheterization which revealed an 80% right coronary artery, patent stent in the circumflex with a 90% lesion distal to the stent.  Patient had PCI of the right coronary artery at that time. Now admitted with UA.  Echocardiogram shows normal LV systolic function with inferior lateral and apical lateral wall motion abnormality.  Assessment & Plan    1 unstable angina-enzymes not consistent with infarct.  However he has had progressive angina.  Xarelto on hold. Proceed with cardiac catheterization today.   The risks and benefits including myocardial infarction, CVA and death discussed previously and he agrees to proceed.  Hold Glucophage for 48 hours following procedure.  We will treat with aspirin, Plavix and continue statin. Continue carvedilol.  Echocardiogram shows preserved LV function with inferolateral and apical lateral wall motion abnormality.  2 Lower ext edema-Echo with preserved LV function.  Would add low-dose diuretic prior to discharge.  3 hyperlipidemia-continue statin and tricor.  4 Tobacco abuse-pt counseled on discontinuing.  5 H/O DVT/PE-hold xarelto prior to cath and resume after.  6 Hypertension-continue preadmission meds.  Blood pressure controlled this morning.  For questions or updates, please contact [facility] Please consult [email or web address] for contact info under        Signed, Olga Millers, MD  11/02/2019, 9:01 AM

## 2019-11-02 NOTE — Progress Notes (Signed)
Inpatient Diabetes Program Recommendations  AACE/ADA: New Consensus Statement on Inpatient Glycemic Control (2015)  Target Ranges:  Prepandial:   less than 140 mg/dL      Peak postprandial:   less than 180 mg/dL (1-2 hours)      Critically ill patients:  140 - 180 mg/dL   Lab Results  Component Value Date   GLUCAP 199 (H) 11/02/2019   HGBA1C 8.2 (H) 11/02/2019    Review of Glycemic Control Results for [name], [name] (MRN [id number]) as of 11/02/2019 13:55  Ref. Range 11/01/2019 20:42 11/02/2019 01:58 11/02/2019 05:55  Glucose-Capillary Latest Ref Range: 70 - 99 mg/dL 131 (H) 233 (H) 199 (H)   Diabetes history: DM 2 Outpatient Diabetes medications:  Lantus 70 units bid, Metformin 1000 mg bid, Victoza 1.2 mg daily, Humalog 20-45 units tid with meals Current orders for Inpatient glycemic control:  Novolog moderate tid with meals Inpatient Diabetes Program Recommendations:   Consider adding Lantus 25 units bid.  Also consider adding Novolog meal coverage 6 units tid with meals (hold if patient eats less than 50%).  Patient was on basal/bolus at home but has been NPO.  Likely will need resumption of some basal and meal coverage while in the hospital to prevent hyperglycemia.  Thanks  Adah Perl, RN, BC-ADM Inpatient Diabetes Coordinator Pager [phone] (8a-5p)

## 2019-11-02 NOTE — ED Notes (Signed)
Pt CBG 233 

## 2019-11-03 DIAGNOSIS — Z955 Presence of coronary angioplasty implant and graft: Secondary | ICD-10-CM

## 2019-11-03 DIAGNOSIS — Z6841 Body Mass Index (BMI) 40.0 and over, adult: Secondary | ICD-10-CM | POA: Diagnosis not present

## 2019-11-03 DIAGNOSIS — Z20828 Contact with and (suspected) exposure to other viral communicable diseases: Secondary | ICD-10-CM | POA: Diagnosis present

## 2019-11-03 DIAGNOSIS — Z86711 Personal history of pulmonary embolism: Secondary | ICD-10-CM | POA: Diagnosis not present

## 2019-11-03 DIAGNOSIS — E669 Obesity, unspecified: Secondary | ICD-10-CM | POA: Diagnosis present

## 2019-11-03 DIAGNOSIS — F329 Major depressive disorder, single episode, unspecified: Secondary | ICD-10-CM | POA: Diagnosis present

## 2019-11-03 DIAGNOSIS — Z720 Tobacco use: Secondary | ICD-10-CM | POA: Diagnosis not present

## 2019-11-03 DIAGNOSIS — J449 Chronic obstructive pulmonary disease, unspecified: Secondary | ICD-10-CM | POA: Diagnosis present

## 2019-11-03 DIAGNOSIS — E785 Hyperlipidemia, unspecified: Secondary | ICD-10-CM | POA: Diagnosis present

## 2019-11-03 DIAGNOSIS — I10 Essential (primary) hypertension: Secondary | ICD-10-CM

## 2019-11-03 DIAGNOSIS — I25110 Atherosclerotic heart disease of native coronary artery with unstable angina pectoris: Secondary | ICD-10-CM | POA: Diagnosis present

## 2019-11-03 DIAGNOSIS — I4891 Unspecified atrial fibrillation: Secondary | ICD-10-CM | POA: Diagnosis present

## 2019-11-03 DIAGNOSIS — Z86718 Personal history of other venous thrombosis and embolism: Secondary | ICD-10-CM | POA: Diagnosis not present

## 2019-11-03 DIAGNOSIS — Z794 Long term (current) use of insulin: Secondary | ICD-10-CM | POA: Diagnosis not present

## 2019-11-03 DIAGNOSIS — F17210 Nicotine dependence, cigarettes, uncomplicated: Secondary | ICD-10-CM | POA: Diagnosis present

## 2019-11-03 DIAGNOSIS — I444 Left anterior fascicular block: Secondary | ICD-10-CM | POA: Diagnosis present

## 2019-11-03 DIAGNOSIS — I252 Old myocardial infarction: Secondary | ICD-10-CM | POA: Diagnosis not present

## 2019-11-03 DIAGNOSIS — I200 Unstable angina: Secondary | ICD-10-CM | POA: Diagnosis present

## 2019-11-03 DIAGNOSIS — Z7901 Long term (current) use of anticoagulants: Secondary | ICD-10-CM | POA: Diagnosis not present

## 2019-11-03 DIAGNOSIS — Z8249 Family history of ischemic heart disease and other diseases of the circulatory system: Secondary | ICD-10-CM | POA: Diagnosis not present

## 2019-11-03 DIAGNOSIS — E1165 Type 2 diabetes mellitus with hyperglycemia: Secondary | ICD-10-CM | POA: Diagnosis present

## 2019-11-03 LAB — GLUCOSE, CAPILLARY
Glucose-Capillary: 208 mg/dL — ABNORMAL HIGH (ref 70–99)
Glucose-Capillary: 209 mg/dL — ABNORMAL HIGH (ref 70–99)

## 2019-11-03 LAB — CBC
HCT: 40.8 % (ref 39.0–52.0)
Hemoglobin: 13 g/dL (ref 13.0–17.0)
MCH: 29 pg (ref 26.0–34.0)
MCHC: 31.9 g/dL (ref 30.0–36.0)
MCV: 91.1 fL (ref 80.0–100.0)
Platelets: 202 10*3/uL (ref 150–400)
RBC: 4.48 MIL/uL (ref 4.22–5.81)
RDW: 13 % (ref 11.5–15.5)
WBC: 8.2 10*3/uL (ref 4.0–10.5)
nRBC: 0 % (ref 0.0–0.2)

## 2019-11-03 LAB — BASIC METABOLIC PANEL
Anion gap: 12 (ref 5–15)
BUN: 16 mg/dL (ref 6–20)
CO2: 22 mmol/L (ref 22–32)
Calcium: 8.5 mg/dL — ABNORMAL LOW (ref 8.9–10.3)
Chloride: 105 mmol/L (ref 98–111)
Creatinine, Ser: 1.28 mg/dL — ABNORMAL HIGH (ref 0.61–1.24)
GFR calc Af Amer: >60 mL/min (ref >60)
GFR calc non Af Amer: >60 mL/min (ref >60)
Glucose, Bld: 222 mg/dL — ABNORMAL HIGH (ref 70–99)
Potassium: 4.3 mmol/L (ref 3.5–5.1)
Sodium: 139 mmol/L (ref 135–145)

## 2019-11-03 MED ORDER — METFORMIN HCL 1000 MG PO TABS: 1000.0000 mg | ORAL_TABLET | Freq: Two times a day (BID) | ORAL | Status: DC

## 2019-11-03 MED ORDER — ANGIOPLASTY BOOK
Freq: Once | Status: AC
  Administered 2019-11-03: 05:00:00
  Filled 2019-11-03: qty 1

## 2019-11-03 MED ORDER — ACETAMINOPHEN 500 MG PO TABS: 1000.0000 mg | ORAL_TABLET | Freq: Once | ORAL | 0 refills | Status: AC

## 2019-11-03 MED ORDER — ACTIVE PARTNERSHIP FOR HEALTH OF YOUR HEART BOOK
Freq: Once | Status: DC
  Filled 2019-11-03: qty 1

## 2019-11-03 MED ORDER — RIVAROXABAN 20 MG PO TABS: 20.0000 mg | ORAL_TABLET | Freq: Every day | ORAL | Status: DC

## 2019-11-03 MED ORDER — HYDROCHLOROTHIAZIDE 12.5 MG PO CAPS: 12.5000 mg | ORAL_CAPSULE | Freq: Every day | ORAL | 6 refills | Status: DC

## 2019-11-03 NOTE — Discharge Summary (Signed)
Discharge Summary    Patient ID: Joseph [name] MRN: [id number]; DOB: Mar 22, 1971  Admit date: 11/01/2019 Discharge date: 11/03/2019  Primary Care Provider: [name], NP  Primary Cardiologist: Skeet Latch, MD  Primary Electrophysiologist:  None   Discharge Diagnoses    Principal Problem:   Unstable angina [facility]) Active Problems:   CAD -S/P PCI 11/02/19    Dyslipidemia   History of pulmonary embolus (PE)   Uncontrolled type 2 diabetes mellitus with hyperglycemia, with long-term current use of insulin (HCC)    Diagnostic Studies/Procedures    Cardiac catheterization 11/02/2019:   Mid LM to Dist LM lesion is 35% stenosed.  Ost 1st Diag to 1st Diag lesion is 15% stenosed.  Mid Cx to Dist Cx lesion is 5% stenosed.  Dist Cx lesion is 90% stenosed.  Prox RCA lesion is 25% stenosed.  Previously placed Mid RCA stent (unknown type) is widely patent.  Ost RPDA to RPDA lesion is 30% stenosed.  Ost Cx to Prox Cx lesion is 40% stenosed.  Post intervention, there is a 0% residual stenosis.  A drug-eluting stent was successfully placed using a STENT RESOLUTE ONYX [id number].  1. Significant underlying three-vessel coronary artery disease with widely patent stents in the left circumflex and right coronary artery. There is a 90% stenosis in the distal left circumflex which was present on most recent cardiac catheterization. 2. The aortic valve was not crossed due to significant tortuosity in the innominate artery. EF was normal by echo. 3. Successful difficult balloon angioplasty and drug-eluting stent placement to the distal left circumflex.  Recommendations: The patient is already on clopidogrel. Anticoagulation with Xarelto can be resumed tomorrow if no bleeding complications. He can be treated without aspirin in order to minimize bleeding risk.  The procedure was very difficult due to the patient's weight and difficult visualization with fluoroscopy,  tortuosity of the innominate artery which made engaging the left main very difficult with poor guide support as well as tortuosity of the left circumflex coming from the left main. Ultimately, I used an AL to guide with the guide liner for support in order to deliver a stent.   Echocardiogram 11/02/2019:  1. Left ventricular ejection fraction, by visual estimation, is 55 to 60%. The left ventricle has normal function. There is mildly increased left ventricular hypertrophy. 2. Mid inferolateral segment and apical lateral segment are abnormal. This is consistent with ischemia/injury in the territory of a posterolateral ventricular artery. 3. Definity contrast agent was given IV to delineate the left ventricular endocardial borders. 4. Elevated left atrial pressure. 5. Left ventricular diastolic parameters are consistent with Grade II diastolic dysfunction (pseudonormalization). 6. Global right ventricle has normal systolic function.The right ventricular size is normal. Right vetricular wall thickness was not assessed. 7. Left atrial size was mildly dilated. 8. Right atrial size was not well visualized. 9. The mitral valve is normal in structure. No evidence of mitral valve regurgitation. 10. The tricuspid valve is not well visualized. Tricuspid valve regurgitation is not demonstrated. 11. The aortic valve is tricuspid. Aortic valve regurgitation is not visualized. Mild aortic valve sclerosis without stenosis. 12. The pulmonic valve was not well visualized. Pulmonic valve regurgitation is not visualized. 13. TR signal is inadequate for assessing pulmonary artery systolic pressure. 14. The inferior vena cava is dilated in size with <50% respiratory variability, suggesting right atrial pressure of 15 mmHg. 15. The interatrial septum was not assessed. _____________   History of Present Illness     Joseph [name] is a  48 y.o. male with PMH of CAD s/p DES x1 to RCA (6/20), HTN, DM, HL  PE (on Xarelto), DVT, COPD, depression and tobacco use presented to ER 11/01/19 with more freq episodes of atrial fib.  He also had increased lower ext edema.  He had missed doses of plavix as well.  His labs in ER were stable and hs Troponin 41, CXR without acute edema, and EKG with sinus tachycardia with left anterior fascicular block and prior anterior lateral/inferior infarct.  He also has several ulcers/wound on lower ext. He was admitted to tele.   Hospital Course     Consultants: none   Pt's echo was stable.  With his lower ext edema plan to add diuretic at discharge.  Xarelto held for cardiac cath.  Troponin not significantly elevated 33;34;33.  No MI.  LDL 149.     Cardiac cath 11/02/19 with significant underlying 3 vessel CAD but with widely patent stents in LCX and RCA.  prior 90% stenosis on distal LCX was present and difficult DES placed.   EF normal.  Per Dr Tyrell Antonio note resume xarelto but can be treated without ASA to minimize bleeding risk.    The procedure was very difficult due to the patient's weight and difficult visualization with fluoroscopy, tortuosity of the innominate artery which made engaging the left main very difficult with poor guide support as well as tortuosity of the left circumflex coming from the left main.  Ultimately, I used an AL to guide with the guide liner for support in order to deliver a stent.   Today pt has been seen and evaluated by Dr. Bronson Ing and found stable for discharge.  He is on BB and statin. He has been instructed to stop tobacco.  On xarelto now.  Hold metformin for 48 hours post cath.   We changed HCTZ from prn to daily.  He may need stronger diuretic on follow up visit.   Foot ulcers with mildly decreased ABIs in 2018.  His PCP is following.      Did the patient have an acute coronary syndrome (MI, NSTEMI, STEMI, etc) this admission?:  No                               Did the patient have a percutaneous coronary intervention (stent /  angioplasty)?:  Yes.     Cath/PCI Registry Performance & Quality Measures: 1. Aspirin prescribed? - No - on xarelto 2. ADP Receptor Inhibitor (Plavix/Clopidogrel, Brilinta/Ticagrelor or Effient/Prasugrel) prescribed (includes medically managed patients)? - Yes 3. High Intensity Statin (Lipitor 40-10m or Crestor 20-494m prescribed? - Yes 4. For EF <40%, was ACEI/ARB prescribed? - Not Applicable (EF >/= 4052%5. For EF <40%, Aldosterone Antagonist (Spironolactone or Eplerenone) prescribed? - Not Applicable (EF >/= 4084%6. Cardiac Rehab Phase II ordered (Included Medically managed Patients)? - Yes   _____________  Discharge Vitals Blood pressure 126/77, pulse 79, temperature (!) 97.5 F (36.4 C), temperature source Oral, resp. rate 19, height _0  (1.956 m), weight (!) 176.5 kg, SpO2 93 %.  Filed Weights   11/02/19 1100 11/02/19 1845 11/03/19 0350  Weight: (!) 176.4 kg (!) 176.2 kg (!) 176.5 kg    Labs & Radiologic Studies    CBC Recent Labs    11/02/19 0507 11/03/19 0414  WBC 8.7 8.2  HGB 13.0 13.0  HCT 41.2 40.8  MCV 92.2 91.1  PLT 230 20132 Basic Metabolic Panel Recent Labs  11/02/19 0507 11/03/19 0414  NA 139 139  K 4.0 4.3  CL 107 105  CO2 24 22  GLUCOSE 193* 222*  BUN 16 16  CREATININE 1.28* 1.28*  CALCIUM 8.7* 8.5*   Liver Function Tests No results for input(s): AST, ALT, ALKPHOS, BILITOT, PROT, ALBUMIN in the last 72 hours. No results for input(s): LIPASE, AMYLASE in the last 72 hours. High Sensitivity Troponin:   Recent Labs  Lab 11/01/19 1203 11/01/19 1556 11/01/19 1930 11/01/19 2130  TROPONINIHS 41* 33* 34* 33*    BNP Invalid input(s): POCBNP D-Dimer No results for input(s): DDIMER in the last 72 hours. Hemoglobin A1C Recent Labs    11/02/19 0507  HGBA1C 8.2*   Fasting Lipid Panel Recent Labs    11/02/19 0507  CHOL 208*  HDL 25*  LDLCALC 149*  TRIG 168*  CHOLHDL 8.3   Thyroid Function Tests No results for input(s): TSH,  T4TOTAL, T3FREE, THYROIDAB in the last 72 hours.  Invalid input(s): FREET3 _____________  Dg Chest 2 View  Result Date: 11/01/2019 CLINICAL DATA:  Chest pain. EXAM: CHEST - 2 VIEW COMPARISON:  09/18/2017. FINDINGS: Mediastinum hilar structures normal. Lungs are clear. No pleural effusion or pneumothorax. Heart size normal. Degenerative change thoracic spine. Old thoracic spine compression fractures noted. No acute bony abnormality. IMPRESSION: No acute cardiopulmonary disease. Electronically Signed   By: Marcello Moores  Register   On: 11/01/2019 12:18   Disposition   Pt is being discharged home today in good condition.  Follow-up Plans & Appointments  Call [facility] at [phone] if any bleeding, swelling or drainage at cath site.  May shower, no tub baths for 48 hours for groin sticks. No lifting over 5 pounds for 3 days.  No Driving for 3 days  Heart Healthy diabetic diet,   Stop tobacco   Do not stop plavix and xarelto.  The combination keep the stents open and control blood clotting with your history of pulmonary embolism.  Do not miss doses.   See your PCP aobut foot ulcers  No metformin until 11/05/19    Follow-up Information    Skeet Latch, MD Follow up on 11/07/2019.   Specialty: Cardiology Why: at 1:30 PM with her PA [facility] information: [address] [place] [address] [phone]          Discharge Instructions    Amb Referral to Cardiac Rehabilitation   Complete by: As directed    Diagnosis:  Coronary Stents PTCA     After initial evaluation and assessments completed: Virtual Based Care may be provided alone or in conjunction with Phase 2 Cardiac Rehab based on patient barriers.: Yes      Discharge Medications   Allergies as of 11/03/2019      Reactions   Sulfa Antibiotics Other (See Comments)   headache      Medication List    TAKE these medications   acetaminophen 500 MG tablet Commonly  known as: TYLENOL Take 2 tablets (1,000 mg total) by mouth once for 1 dose.   ALPRAZolam 1 MG tablet Commonly known as: XANAX Take 1-2 mg by mouth 4 (four) times daily as needed (anxiety).   amLODipine 2.5 MG tablet Commonly known as: NORVASC Take 2.5 mg by mouth daily.   atorvastatin 80 MG tablet Commonly known as: LIPITOR Take 1 tablet (80 mg total) by mouth daily at 6 PM.   blood glucose meter kit and supplies Kit Dispense based on patient and insurance preference. Use  up to four times daily as directed. (FOR ICD-9 250.00, 250.01).   buPROPion 150 MG 12 hr tablet Commonly known as: WELLBUTRIN SR Take 150 mg by mouth 2 (two) times daily.   carvedilol 6.25 MG tablet Commonly known as: COREG Take 1 tablet (6.25 mg total) by mouth 2 (two) times daily.   clopidogrel 75 MG tablet Commonly known as: PLAVIX Take 1 tablet (75 mg total) by mouth daily. Please schedule yearly appointment for more refills, thank you! 1st attempt   fenofibrate 145 MG tablet Commonly known as: TRICOR Take 145 mg by mouth daily.   fluconazole 100 MG tablet Commonly known as: DIFLUCAN Take 100 mg by mouth every Monday.   fluticasone 50 MCG/ACT nasal spray Commonly known as: FLONASE Place 2 sprays into both nostrils daily as needed for allergies.   HumaLOG KwikPen 100 UNIT/ML KwikPen Generic drug: insulin lispro Inject 20-45 Units into the skin 3 (three) times daily with meals. sliding scale   hydrochlorothiazide 12.5 MG capsule Commonly known as: Microzide Take 1 capsule (12.5 mg total) by mouth daily. What changed:   how much to take  how to take this  when to take this  additional instructions   Lantus SoloStar 100 UNIT/ML Solostar Pen Generic drug: Insulin Glargine Inject 70 Units into the skin 2 (two) times daily.   lisinopril 5 MG tablet Commonly known as: ZESTRIL Take 5 mg by mouth daily.   meclizine 25 MG tablet Commonly known as: ANTIVERT Take 25-50 mg by mouth See  admin instructions. Take 2 tablets (50 mg) by mouth in the morning & take 1 tablet (25 mg) by mouth at night.   metFORMIN 1000 MG tablet Commonly known as: GLUCOPHAGE Take 1 tablet (1,000 mg total) by mouth 2 (two) times daily with a meal. Start taking on: November 05, 2019 What changed: These instructions start on November 05, 2019. If you are unsure what to do until then, ask your doctor or other care provider.   nitroGLYCERIN 0.4 MG SL tablet Commonly known as: NITROSTAT Place 1 tablet (0.4 mg total) under the tongue every 5 (five) minutes as needed for chest pain.   oxyCODONE 15 MG immediate release tablet Commonly known as: ROXICODONE Take 15 mg by mouth 5 (five) times daily.   pantoprazole 40 MG tablet Commonly known as: PROTONIX Take 1 tablet (40 mg total) by mouth daily. What changed: how much to take   pregabalin 150 MG capsule Commonly known as: LYRICA Take 150 mg by mouth 3 (three) times daily.   ProAir HFA 108 (90 Base) MCG/ACT inhaler Generic drug: albuterol Inhale 2 puffs into the lungs every 6 (six) hours as needed for wheezing or shortness of breath.   promethazine 25 MG tablet Commonly known as: PHENERGAN Take 50 mg by mouth every 6 (six) hours as needed for nausea.   rivaroxaban 20 MG Tabs tablet Commonly known as: Xarelto Take 1 tablet (20 mg total) by mouth daily. What changed: additional instructions   Victoza 18 MG/3ML Sopn Generic drug: liraglutide Inject 1.2 mg into the skin at bedtime.          Outstanding Labs/Studies   BMP  Duration of Discharge Encounter   Greater than 30 minutes including physician time.  Signed, Cecilie Kicks, NP 11/03/2019, 1:06 PM

## 2019-11-03 NOTE — Care Management Obs Status (Signed)
[facility] NOTIFICATION   Patient Details  Name: Joseph [name] MRN: [id number] Date of Birth: 08/27/1971   Medicare Observation Status Notification Given: yes      Norina Buzzard, RN 11/03/2019, 10:18 AM

## 2019-11-03 NOTE — Discharge Instructions (Signed)
Call [facility] at [phone] if any bleeding, swelling or drainage at cath site.  May shower, no tub baths for 48 hours for groin sticks. No lifting over 5 pounds for 3 days.  No Driving for 3 days  Heart Healthy diabetic diet,   Stop tobacco   Do not stop plavix and xarelto.  The combination keep the stents open and control blood clotting with your history of pulmonary embolism.  Do not miss doses.   See your PCP about foot ulcers.  No Metformin until 11/05/19 it may interact with cath dye.  The dye will be out of your system on the 16th.

## 2019-11-03 NOTE — Progress Notes (Signed)
Pt declined walking. He sts he is only ambulating 30 ft at baseline due to hip arthritis. I encouraged him to get a cane for support. In depth discussion of managing CAD and decreasing his weight so he can ambulate more. Discussed Plavix, restrictions, stent, smoking cessation, diet, NTG, and CRPII. He is now motivated to quit smoking. Resources given. He plans to quit Monday. Discussed portion control and exercise as tolerated (would be better with stationary bike). Will refer to [facility] however he is not interested as transport and scheduling was an issue in the summer.  [id number] Joseph [name] CES, ACSM 10:53 AM 11/03/2019

## 2019-11-03 NOTE — Progress Notes (Signed)
Progress Note  Patient Name: Joseph [name] Date of Encounter: 11/03/2019  Primary Cardiologist: Chilton Siiffany Gakona, MD   Subjective   Doing well this morning.  Eager to go home.  Inpatient Medications    Scheduled Meds: . acetaminophen  1,000 mg Oral Once  . active partnership for health of your heart book   Does not apply Once  . amLODipine  2.5 mg Oral Daily  . aspirin EC  81 mg Oral Daily  . atorvastatin  80 mg Oral q1800  . buPROPion  150 mg Oral BID  . carvedilol  6.25 mg Oral BID  . clopidogrel  75 mg Oral Daily  . fenofibrate  160 mg Oral Daily  . insulin aspart  0-15 Units Subcutaneous TID WC  . oxyCODONE  15 mg Oral 5 X Daily  . pantoprazole  40 mg Oral Daily  . sodium chloride flush  3 mL Intravenous Q12H  . sodium chloride flush  3 mL Intravenous Q12H   Continuous Infusions: . sodium chloride     PRN Meds: sodium chloride, acetaminophen, nitroGLYCERIN, ondansetron (ZOFRAN) IV, promethazine, sodium chloride flush   Vital Signs    Vitals:   11/02/19 1845 11/02/19 2035 11/03/19 0337 11/03/19 0350  BP:  119/73 126/77   Pulse: 84 80 79   Resp:  18 19   Temp:  98.2 F (36.8 C) (!) 97.5 F (36.4 C)   TempSrc:  Oral Oral   SpO2: 99% 96% 93%   Weight: (!) 176.2 kg   (!) 176.5 kg  Height: 6\' 5"  (1.956 m)       Intake/Output Summary (Last 24 hours) at 11/03/2019 1021 Last data filed at 11/03/2019 0350 Gross per 24 hour  Intake 300 ml  Output 700 ml  Net -400 ml   Filed Weights   11/02/19 1100 11/02/19 1845 11/03/19 0350  Weight: (!) 176.4 kg (!) 176.2 kg (!) 176.5 kg    Telemetry    Sinus rhythm- Personally Reviewed  ECG    Sinus rhythm with possible old inferior and anterolateral infarct pattern- Personally Reviewed  Physical Exam   GEN: No acute distress.   Neck: No JVD Cardiac: RRR, no murmurs, rubs, or gallops.  Respiratory: Clear to auscultation bilaterally. GI: Soft, nontender, non-distended  MS: No edema; No deformity. Neuro:   Nonfocal  Psych: Normal affect   Labs    Chemistry Recent Labs  Lab 11/01/19 1203 11/01/19 1930 11/02/19 0507 11/03/19 0414  NA 141  --  139 139  K 4.4  --  4.0 4.3  CL 106  --  107 105  CO2 24  --  24 22  GLUCOSE 138*  --  193* 222*  BUN 18  --  16 16  CREATININE 1.20 1.18 1.28* 1.28*  CALCIUM 9.3  --  8.7* 8.5*  GFRNONAA >60 >60 >60 >60  GFRAA >60 >60 >60 >60  ANIONGAP 11  --  8 12     Hematology Recent Labs  Lab 11/01/19 1203 11/02/19 0507 11/03/19 0414  WBC 9.4 8.7 8.2  RBC 4.67 4.47 4.48  HGB 13.5 13.0 13.0  HCT 43.4 41.2 40.8  MCV 92.9 92.2 91.1  MCH 28.9 29.1 29.0  MCHC 31.1 31.6 31.9  RDW 13.1 13.1 13.0  PLT 250 230 202    Cardiac EnzymesNo results for input(s): TROPONINI in the last 168 hours. No results for input(s): TROPIPOC in the last 168 hours.   BNP Recent Labs  Lab 11/01/19 1556  BNP 43.3  DDimer No results for input(s): DDIMER in the last 168 hours.   Radiology    Dg Chest 2 View  Result Date: 11/01/2019 CLINICAL DATA:  Chest pain. EXAM: CHEST - 2 VIEW COMPARISON:  09/18/2017. FINDINGS: Mediastinum hilar structures normal. Lungs are clear. No pleural effusion or pneumothorax. Heart size normal. Degenerative change thoracic spine. Old thoracic spine compression fractures noted. No acute bony abnormality. IMPRESSION: No acute cardiopulmonary disease. Electronically Signed   By: Maisie Fus  Register   On: 11/01/2019 12:18    Cardiac Studies   Cardiac catheterization 11/02/2019:   Mid LM to Dist LM lesion is 35% stenosed.  Ost 1st Diag to 1st Diag lesion is 15% stenosed.  Mid Cx to Dist Cx lesion is 5% stenosed.  Dist Cx lesion is 90% stenosed.  Prox RCA lesion is 25% stenosed.  Previously placed Mid RCA stent (unknown type) is widely patent.  Ost RPDA to RPDA lesion is 30% stenosed.  Ost Cx to Prox Cx lesion is 40% stenosed.  Post intervention, there is a 0% residual stenosis.  A drug-eluting stent was successfully  placed using a STENT RESOLUTE ONYX [id number].   1.  Significant underlying three-vessel coronary artery disease with widely patent stents in the left circumflex and right coronary artery.  There is a 90% stenosis in the distal left circumflex which was present on most recent cardiac catheterization. 2.  The aortic valve was not crossed due to significant tortuosity in the innominate artery.  EF was normal by echo. 3.  Successful difficult balloon angioplasty and drug-eluting stent placement to the distal left circumflex.  Recommendations: The patient is already on clopidogrel.  Anticoagulation with Xarelto can be resumed tomorrow if no bleeding complications.  He can be treated without aspirin in order to minimize bleeding risk.  The procedure was very difficult due to the patient's weight and difficult visualization with fluoroscopy, tortuosity of the innominate artery which made engaging the left main very difficult with poor guide support as well as tortuosity of the left circumflex coming from the left main.  Ultimately, I used an AL to guide with the guide liner for support in order to deliver a stent.   Echocardiogram 11/02/2019:   1. Left ventricular ejection fraction, by visual estimation, is 55 to 60%. The left ventricle has normal function. There is mildly increased left ventricular hypertrophy.  2. Mid inferolateral segment and apical lateral segment are abnormal. This is consistent with ischemia/injury in the territory of a posterolateral ventricular artery.  3. Definity contrast agent was given IV to delineate the left ventricular endocardial borders.  4. Elevated left atrial pressure.  5. Left ventricular diastolic parameters are consistent with Grade II diastolic dysfunction (pseudonormalization).  6. Global right ventricle has normal systolic function.The right ventricular size is normal. Right vetricular wall thickness was not assessed.  7. Left atrial size was mildly dilated.   8. Right atrial size was not well visualized.  9. The mitral valve is normal in structure. No evidence of mitral valve regurgitation. 10. The tricuspid valve is not well visualized. Tricuspid valve regurgitation is not demonstrated. 11. The aortic valve is tricuspid. Aortic valve regurgitation is not visualized. Mild aortic valve sclerosis without stenosis. 12. The pulmonic valve was not well visualized. Pulmonic valve regurgitation is not visualized. 13. TR signal is inadequate for assessing pulmonary artery systolic pressure. 14. The inferior vena cava is dilated in size with <50% respiratory variability, suggesting right atrial pressure of 15 mmHg. 15. The interatrial septum was not assessed.  Patient Profile     48 y.o. male with past medical history of diabetes mellitus, hypertension, hyperlipidemia, tobacco abuse, obstructive sleep apnea, coronary artery disease with unstable angina. Patient has had previous PCI of his circumflex in 2017. In June 2020 he had cardiac catheterization which revealed an 80% right coronary artery, patent stent in the circumflex with a 90% lesion distal to the stent. Patient had PCI of the right coronary artery at that time. Now admitted with UA.  Echocardiogram shows normal LV systolic function with inferior lateral and apical lateral wall motion abnormality.  Assessment & Plan    1.  Unstable angina: Status post PCI to the distal left circumflex on 11/02/2019.  He will be treated with Plavix and without aspirin given the requirement for Xarelto in order to minimize bleeding risk.  Continue atorvastatin and carvedilol.  2.  Hyperlipidemia: Continue atorvastatin and TriCor.  3.  Tobacco abuse: Cessation counseling previously provided.  4.  History of DVT/pulmonary embolism: Xarelto has been on hold.  This can now be resumed.  5.  Hypertension: Blood pressure is normal.  Continue current therapy.   Disposition: Patient will be discharged home today.    For questions or updates, please contact [place] Please consult [email or web address] for contact info under Cardiology/STEMI.      Signed, Kate Sable, MD  11/03/2019, 10:21 AM

## 2019-11-05 ENCOUNTER — Encounter (HOSPITAL_COMMUNITY): Payer: Self-pay | Admitting: Cardiovascular Disease

## 2019-11-07 ENCOUNTER — Ambulatory Visit: Payer: Medicare Other | Admitting: Cardiology

## 2019-11-12 ENCOUNTER — Encounter: Payer: Self-pay | Admitting: Cardiology

## 2019-11-12 ENCOUNTER — Ambulatory Visit (INDEPENDENT_AMBULATORY_CARE_PROVIDER_SITE_OTHER): Payer: Medicare Other | Admitting: Cardiology

## 2019-11-12 ENCOUNTER — Other Ambulatory Visit: Payer: Self-pay

## 2019-11-12 VITALS — BP 165/92 | HR 90 | Ht 77.0 in | Wt 387.0 lb

## 2019-11-12 DIAGNOSIS — I10 Essential (primary) hypertension: Secondary | ICD-10-CM | POA: Diagnosis not present

## 2019-11-12 DIAGNOSIS — R400 Somnolence: Secondary | ICD-10-CM

## 2019-11-12 DIAGNOSIS — G4733 Obstructive sleep apnea (adult) (pediatric): Secondary | ICD-10-CM

## 2019-11-12 DIAGNOSIS — Z9861 Coronary angioplasty status: Secondary | ICD-10-CM

## 2019-11-12 DIAGNOSIS — I2510 Atherosclerotic heart disease of native coronary artery without angina pectoris: Secondary | ICD-10-CM

## 2019-11-12 DIAGNOSIS — I200 Unstable angina: Secondary | ICD-10-CM | POA: Diagnosis not present

## 2019-11-12 MED ORDER — LISINOPRIL 10 MG PO TABS: 10.0000 mg | ORAL_TABLET | Freq: Every day | ORAL | 1 refills | Status: DC

## 2019-11-12 MED ORDER — CARVEDILOL 12.5 MG PO TABS: 12.5000 mg | ORAL_TABLET | Freq: Two times a day (BID) | ORAL | 1 refills | Status: DC

## 2019-11-12 NOTE — Progress Notes (Signed)
Cardiology Office Note:    Date:  11/12/2019   ID:  [name], DOB [birth year], MRN [id number]  PCP:  Joseph Beals, NP  Cardiologist:  Joseph Latch, MD  Electrophysiologist:  None   Referring MD: Joseph Beals, NP   [facility] f/u-  History of Present Illness:    Joseph [name] is a 48 y.o. male [place],who has a history of normal coronaries in 2006.  Other medical problems include insulin-dependent diabetes, hypertension, dyslipidemia, sleep apnea not on C-pap, chronic pain, prior pulmonary embolism in 2014 on chronic Xarelto, smoking, and morbid obesity. At one point he was over 600 pounds.   In 2017 he presented with chest pain. Catheterization was done and he had an 80% distal circumflex. This was treated with a bare-metal stent. He had no other significant coronary disease at that time. Echocardiogram done in August 2017 showed an ejection fraction of 55 to 60% with mild LVH.   He was seen in the office in March 2020 with complaints of chest pain and LE edema.  Low dose Coreg was added, he had no LE edema on exam that day in the office.  Echo and Myoview were ordered but delayed secondary to [place] pandemic. He did try low dose nitrate but couldn't tolerate it secondary to headache.  Myoview was eventually done 05/23/2019 and was abnormal with new LVD-EF 40%.  It was decided to proceed with diagnostic cath which was done 05/30/2019.  At cath his previously place CFX stent was patent but he had a distal 90% stenosis (medical Rx).  He also had an 80% mRCA stenosis that was treated with PCI/DES.  He was admitted again with chest pain with radiation to his Lt arm.  Cath showed patent stents in the CFX and RCA and the distal CFX 90% stenosis which was dilated and stented with a DES.  He returns to the clinic today for follow up.  In the hospital his LDL was 149- he assures me he was taking his Lipitor 80 mg daily.  His B/P today is 165/90.  He  is not using his C-pap and asked about getting another sleep study to get this sorted out.  He has daytime sleepiness, uncontrolled HTN, DM, CAD, and morbid obesity as well as a previous positive sleep study in 2014.   Past Medical History:  Diagnosis Date  . Anxiety   . Arthritis    "knees, shoulders, hips, ankles" (07/29/2016)  . Asthma   . CAD (coronary artery disease)    a. 2017: s/p BMS to distal Cx; b. LHC 05/30/2019: 80% mid, distal RCA s/p DES, 30% narrowing of d LM, widely patent LAD w/ luminal irregularities, widely patent stent in [place]  w/ 90+% stenosis distal to stent beofre small trifurcating obtuse marginal (potentially area of restenosis)  . Chronic bronchitis ([place])   . Chronic ulcer of right great toe ([place]) 09/19/2017  . COPD (chronic obstructive pulmonary disease) ([place])   . Depression   . DVT (deep venous thrombosis) ([place])   . GERD (gastroesophageal reflux disease)   . Hyperlipidemia   . Hypertension   . Myocardial infarction ([place]) 1996   "light one"  . PE (pulmonary embolism) 04/2013   On chronic Xarelto  . Peripheral nerve disease   . Pneumonia "several times"  . Type II diabetes mellitus ([place])     Past Surgical History:  Procedure Laterality Date  . AMPUTATION Right 09/21/2017   Procedure: RIGHT GREAT TOE AMPUTATION,  POSSIBLE VAC;  Surgeon: Leandrew Koyanagi, MD;  Location: [facility];  Service: Orthopedics;  Laterality: Right;  . CARDIAC CATHETERIZATION  2006  . CARDIAC CATHETERIZATION  1996   "@ [facility]; when I had my heart attack"  . CARDIAC CATHETERIZATION N/A 07/29/2016   Procedure: Left Heart Cath and Coronary Angiography;  Surgeon: Lorretta Harp, MD;  Location: [facility];  Service: Cardiovascular;  Laterality: N/A;  . CARDIAC CATHETERIZATION N/A 07/29/2016   Procedure: Coronary Stent Intervention;  Surgeon: Lorretta Harp, MD;  Location: [facility];  Service: Cardiovascular;  Laterality: N/A;  . CARPAL TUNNEL RELEASE Bilateral   . CORONARY  ANGIOPLASTY WITH STENT PLACEMENT  07/29/2016  . CORONARY STENT INTERVENTION N/A 05/30/2019   Procedure: CORONARY STENT INTERVENTION;  Surgeon: Belva Crome, MD;  Location: [facility];  Service: Cardiovascular;  Laterality: N/A;  . CORONARY STENT INTERVENTION N/A 11/02/2019   Procedure: CORONARY STENT INTERVENTION;  Surgeon: Wellington Hampshire, MD;  Location: [facility];  Service: Cardiovascular;  Laterality: N/A;  . ESOPHAGOGASTRODUODENOSCOPY N/A 09/22/2017   Procedure: ESOPHAGOGASTRODUODENOSCOPY (EGD);  Surgeon: Ladene Artist, MD;  Location: [facility];  Service: Endoscopy;  Laterality: N/A;  . KNEE ARTHROSCOPY Bilateral    "2 on left; 1 on the right"  . LEFT HEART CATH AND CORONARY ANGIOGRAPHY N/A 11/02/2019   Procedure: LEFT HEART CATH AND CORONARY ANGIOGRAPHY;  Surgeon: Wellington Hampshire, MD;  Location: [facility];  Service: Cardiovascular;  Laterality: N/A;  . RIGHT/LEFT HEART CATH AND CORONARY ANGIOGRAPHY N/A 05/30/2019   Procedure: RIGHT/LEFT HEART CATH AND CORONARY ANGIOGRAPHY;  Surgeon: Belva Crome, MD;  Location: [facility];  Service: Cardiovascular;  Laterality: N/A;  . SHOULDER OPEN ROTATOR CUFF REPAIR Bilateral     Current Medications: Current Meds  Medication Sig  . albuterol (PROAIR HFA) 108 (90 BASE) MCG/ACT inhaler Inhale 2 puffs into the lungs every 6 (six) hours as needed for wheezing or shortness of breath.   . ALPRAZolam (XANAX) 1 MG tablet Take 1-2 mg by mouth 4 (four) times daily as needed (anxiety).   Marland Kitchen atorvastatin (LIPITOR) 80 MG tablet Take 1 tablet (80 mg total) by mouth daily at 6 PM.  . blood glucose meter kit and supplies KIT Dispense based on patient and insurance preference. Use up to four times daily as directed. (FOR ICD-9 250.00, 250.01).  Marland Kitchen buPROPion (WELLBUTRIN SR) 150 MG 12 hr tablet Take 150 mg by mouth 2 (two) times daily.   . carvedilol (COREG) 12.5 MG tablet Take 1 tablet (12.5 mg total) by mouth 2 (two) times  daily.  . clopidogrel (PLAVIX) 75 MG tablet Take 1 tablet (75 mg total) by mouth daily. Please schedule yearly appointment for more refills, thank you! 1st attempt  . fenofibrate (TRICOR) 145 MG tablet Take 145 mg by mouth daily.   . fluconazole (DIFLUCAN) 100 MG tablet Take 100 mg by mouth every Monday.  . fluticasone (FLONASE) 50 MCG/ACT nasal spray Place 2 sprays into both nostrils daily as needed for allergies.  Marland Kitchen HUMALOG KWIKPEN 100 UNIT/ML SOPN Inject 20-45 Units into the skin 3 (three) times daily with meals. sliding scale  . hydrochlorothiazide (MICROZIDE) 12.5 MG capsule Take 1 capsule (12.5 mg total) by mouth daily.  Marland Kitchen LANTUS SOLOSTAR 100 UNIT/ML Solostar Pen Inject 70 Units into the skin 2 (two) times daily.   Marland Kitchen lisinopril (ZESTRIL) 10 MG tablet Take 1 tablet (10 mg total) by mouth daily.  Marland Kitchen  meclizine (ANTIVERT) 25 MG tablet Take 25-50 mg by mouth See admin instructions. Take 2 tablets (50 mg) by mouth in the morning & take 1 tablet (25 mg) by mouth at night.  . metFORMIN (GLUCOPHAGE) 1000 MG tablet Take 1 tablet (1,000 mg total) by mouth 2 (two) times daily with a meal.  . nitroGLYCERIN (NITROSTAT) 0.4 MG SL tablet Place 1 tablet (0.4 mg total) under the tongue every 5 (five) minutes as needed for chest pain.  Marland Kitchen oxyCODONE (ROXICODONE) 15 MG immediate release tablet Take 15 mg by mouth 5 (five) times daily.   . pantoprazole (PROTONIX) 40 MG tablet Take 1 tablet (40 mg total) by mouth daily. (Patient taking differently: Take 80 mg by mouth daily. )  . pregabalin (LYRICA) 150 MG capsule Take 150 mg by mouth 3 (three) times daily.   . promethazine (PHENERGAN) 25 MG tablet Take 50 mg by mouth every 6 (six) hours as needed for nausea.   . Rivaroxaban (XARELTO) 20 MG TABS Take 1 tablet (20 mg total) by mouth daily. (Patient taking differently: Take 20 mg by mouth daily. Take with food)  . VICTOZA 18 MG/3ML SOPN Inject 1.2 mg into the skin at bedtime.  . [DISCONTINUED] amLODipine (NORVASC) 2.5  MG tablet Take 2.5 mg by mouth daily.   . [DISCONTINUED] carvedilol (COREG) 6.25 MG tablet Take 1 tablet (6.25 mg total) by mouth 2 (two) times daily.  . [DISCONTINUED] lisinopril (ZESTRIL) 5 MG tablet Take 5 mg by mouth daily.     Allergies:   Sulfa antibiotics   Social History   Socioeconomic History  . Marital status: Married    Spouse name: Not on file  . Number of children: Not on file  . Years of education: Not on file  . Highest education level: Not on file  Occupational History  . Occupation: disabled     Fish farm manager: UNEMPLOYED  Social Needs  . Financial resource strain: Not on file  . Food insecurity    Worry: Not on file    Inability: Not on file  . Transportation needs    Medical: Not on file    Non-medical: Not on file  Tobacco Use  . Smoking status: Current Every Day Smoker    Packs/day: 1.00    Years: 34.00    Pack years: 34.00    Types: Cigarettes  . Smokeless tobacco: Former Systems developer    Types: Snuff, Chew  . Tobacco comment: 8/910/2017 "3 ppd before 2015"  Substance and Sexual Activity  . Alcohol use: Yes    Comment: RARE  . Drug use: No  . Sexual activity: Yes  Lifestyle  . Physical activity    Days per week: Not on file    Minutes per session: Not on file  . Stress: Not on file  Relationships  . Social Herbalist on phone: Not on file    Gets together: Not on file    Attends religious service: Not on file    Active member of club or organization: Not on file    Attends meetings of clubs or organizations: Not on file    Relationship status: Not on file  Other Topics Concern  . Not on file  Social History Narrative  . Not on file     Family History: The patient's family history includes Diabetes in an other family member; Hyperlipidemia in an other family member; Hypertension in his brother and father.  ROS:   Please see the history of present illness.  All other systems reviewed and are negative.  EKGs/Labs/Other Studies  Reviewed:    The following studies were reviewed today: Cath/ PCI 11/02/2019 Echo 11/02/2019- EF 55-60% with mild inferior-lateral HL and grade 2 DD.   EKG:  EKG is ordered today.  The ekg ordered today demonstrates NSR, inferior Qs, anterior Qs  Recent Labs: 11/01/2019: B Natriuretic Peptide 43.3 11/03/2019: BUN 16; Creatinine, Ser 1.28; Hemoglobin 13.0; Platelets 202; Potassium 4.3; Sodium 139  Recent Lipid Panel    Component Value Date/Time   CHOL 208 (H) 11/02/2019 0507   TRIG 168 (H) 11/02/2019 0507   HDL 25 (L) 11/02/2019 0507   CHOLHDL 8.3 11/02/2019 0507   VLDL 34 11/02/2019 0507   LDLCALC 149 (H) 11/02/2019 0507   LDLDIRECT 120.0 05/28/2015 0951    Physical Exam:    VS:  BP (!) 165/92   Pulse 90   Ht _0  (1.956 m)   Wt (!) 387 lb (175.5 kg)   SpO2 98%   BMI 45.89 kg/m     Wt Readings from Last 3 Encounters:  11/12/19 (!) 387 lb (175.5 kg)  11/03/19 (!) 389 lb 1.8 oz (176.5 kg)  11/01/19 (!) 389 lb (176.4 kg)     GEN: Morbidly obese Caucasian male, well developed in no acute distress HEENT: Normal NECK: No JVD; No carotid bruits CARDIAC: RRR, no murmurs, rubs, gallops RESPIRATORY:  Clear to auscultation without rales, wheezing or rhonchi  ABDOMEN: Soft, non-tender, non-distended MUSCULOSKELETAL:  No edema; No deformity  SKIN: Warm and dry, multiple tattoos NEUROLOGIC:  Alert and oriented x 3 PSYCHIATRIC:  Normal affect   ASSESSMENT:    CAD/PCI- S/P distal CFX PCI with BMS Aug 2017- no other significant CAD and normal LVF. C/P-March 2020 which led to June 2020-abnormal Myoview (drop in EF)- cath 80% mRCA-(DES), and 90% dCFX after the '17 DES-(medical Rx). His LVF was normal at cath. Admitted 11/02/2019 with [place]- dCFX 90% treated with PCI/DES.   Edema- No change with HCTZ- try stopping Norvasc  HLD- Statin increased- I think he will need additional Rx- will ask for lipid clinic input.   HTN- Increase coreg to 12.5 mg BID and Lisinopril to 10  mg daily- check BMP in two weeks.   H/O PE- On Xarelto (no ASA as he is on Plavix)  IDDM- Per PCP  OSA- I will see if we can get another sleep study done as he says he has to have this to get C-pap.   PLAN:    Virtual f/u in 3 months.   Medication Adjustments/Labs and Tests Ordered: Current medicines are reviewed at length with the patient today.  Concerns regarding medicines are outlined above.  Orders Placed This Encounter  Procedures  . Basic Metabolic Panel (BMET)  . EKG 12-Lead   Meds ordered this encounter  Medications  . lisinopril (ZESTRIL) 10 MG tablet    Sig: Take 1 tablet (10 mg total) by mouth daily.    Dispense:  90 tablet    Refill:  1  . carvedilol (COREG) 12.5 MG tablet    Sig: Take 1 tablet (12.5 mg total) by mouth 2 (two) times daily.    Dispense:  180 tablet    Refill:  1    Patient Instructions  Medication Instructions:  INCREASE Lisinopril to 10m Take 1 tablet once a day  INCREASE Coreg to 12.565mTake 1 tablet twice a day  STOP NORVASC *If you need a refill on your cardiac medications before your next appointment, please call  your pharmacy*  Lab Work: Your physician recommends that you return for lab work in: [place]  If you have labs (blood work) drawn today and your tests are completely normal, you will receive your results only by: Marland Kitchen MyChart Message (if you have MyChart) OR . A paper copy in the mail If you have any lab test that is abnormal or we need to change your treatment, we will call you to review the results.  Testing/Procedures: NONE   Follow-Up: At [facility], you and your health needs are our priority.  As part of our continuing mission to provide you with exceptional heart care, we have created designated Provider Care Teams.  These Care Teams include your primary Cardiologist (physician) and Advanced Practice Providers (APPs -  Physician Assistants and Nurse Practitioners) who all work together to provide you with  the care you need, when you need it.  Your next appointment:   3 month(s)  The format for your next appointment:   Virtual Visit   Provider:   Kerin Ransom, PA-C   NEEDS APPT WITH LIPID CLINIC   Other Instructions REFERRAL TO LIPID CLINIC(PHARM D)    Signed, Kerin Ransom, PA-C  11/12/2019 4:42 PM    [place]

## 2019-11-12 NOTE — Patient Instructions (Signed)
Medication Instructions:  INCREASE Lisinopril to 10mg  Take 1 tablet once a day  INCREASE Coreg to 12.5mg  Take 1 tablet twice a day  STOP NORVASC *If you need a refill on your cardiac medications before your next appointment, please call your pharmacy*  Lab Work: Your physician recommends that you return for lab work in: [place]  If you have labs (blood work) drawn today and your tests are completely normal, you will receive your results only by: Marland Kitchen MyChart Message (if you have MyChart) OR . A paper copy in the mail If you have any lab test that is abnormal or we need to change your treatment, we will call you to review the results.  Testing/Procedures: NONE   Follow-Up: At [facility], you and your health needs are our priority.  As part of our continuing mission to provide you with exceptional heart care, we have created designated Provider Care Teams.  These Care Teams include your primary Cardiologist (physician) and Advanced Practice Providers (APPs -  Physician Assistants and Nurse Practitioners) who all work together to provide you with the care you need, when you need it.  Your next appointment:   3 month(s)  The format for your next appointment:   Virtual Visit   Provider:   Kerin Ransom, PA-C   NEEDS APPT WITH LIPID CLINIC   Other Instructions REFERRAL TO LIPID CLINIC(PHARM D)

## 2019-11-13 ENCOUNTER — Telehealth: Payer: Self-pay | Admitting: *Deleted

## 2019-11-13 NOTE — Telephone Encounter (Signed)
Left message to return a call to discuss sleep study appointment details. 

## 2019-11-14 ENCOUNTER — Telehealth: Payer: Self-pay | Admitting: *Deleted

## 2019-11-14 NOTE — Telephone Encounter (Signed)
Left COVID and sleep study appointment details on VM.

## 2019-11-19 ENCOUNTER — Other Ambulatory Visit (HOSPITAL_COMMUNITY)
Admission: RE | Admit: 2019-11-19 | Discharge: 2019-11-19 | Disposition: A | Discharge status: Home / Self Care | Payer: Medicare Other | Source: Ambulatory Visit | Attending: Cardiovascular Disease | Admitting: Cardiovascular Disease

## 2019-11-19 ENCOUNTER — Other Ambulatory Visit: Payer: Self-pay

## 2019-12-09 NOTE — Progress Notes (Unsigned)
Patient ID: Joseph [name]                 DOB: 1971-07-28                    MRN: [id number]     HPI: Joseph [name] is a 48 y.o. male patient of Dr. Oval Linsey referred to lipid clinic by Kerin Ransom, PA-C. PMH is significant for CAD (S/P PCI 11/02/2019); DVT (2020), unstable angina, OSA, T2DM, peripheral nerve disease, hx of PE (2014), dyslipidemia, and anxiety.  Prior cardiac history has been summarized by Kerin Ransom, PA-C. In 2017 he presented with chest pain. Catheterization was done and he had an 80% distal circumflex. This was treated with a bare-metal stent. He had no other significant coronary disease at that time. Echocardiogram done in August 2017 showed an ejection fraction of 55 to 60% with mild LVH.He was seen in the office in March 2020 with complaints of chest pain and LE edema. Low dose Coreg was added, he had no LE edema on exam that day in the office. Echo and Myoview were ordered but delayed secondary to [place] pandemic. He did try low dose nitrate but couldn't tolerate it secondary to headache. Myoview was eventually done 05/23/2019 and was abnormal with new LVD-EF 40%. It was decided to proceed with diagnostic cath which was done 05/30/2019. At cath his previously place CFX stent was patent but he had a distal 90% stenosis (medical Rx). He also had an 80% mRCA stenosis that was treated with PCI/DES.  Patient was recently admitted (11/01/19 - 11/03/19) with chest pain with radiation to his left arm.  Cath showed patent stents in the CFX and RCA and the distal CFX 90% stenosis which was dilated and stented with a DES.   At prior appt with Kerin Ransom, PA-C, LDL was noted to be elevated on atorvastatin 80 mg daily. Also, patient was noted to have elevated BP at office visit with a BP reading of 165/92 mmHg. Patient's carvedilol was increased to 12.5 mg BID and lisinopril increased to 10 mg daily. HCTZ 12.5 mg was continued. Patient was told to consider discontinuing amlodipine  to decrease edema.  Patient presents today for initial appt with lipid clinic.  When did he start fenofibrate? What meds was he on in 2016? Lisinopril/HCTZ - stopped before cath in 2018? Why wasn't he restarted?  Current HLD Medications: atorvastatin 80 mg daily Intolerances: none   Risk Factors: age, BMI, DM, HTN, ASCVD (multiple stents, angina), smoker?, family history LDL goal: <55 mg/dL  Current HTN Medications: lisinopril 10 mg daily, carvedilol 12.5 mg BID, HCTZ 12.5 mg daily Intolerances: lisinopril-HCTZ BP goal: <130/80 mmHg  Diet:   Exercise:   Family History: father (HTN); brother (HTN); other family member (HLD); other family member (DM)  Social History:   Labs: 11/02/2019 TC 208 TG 168 HDL 25 VLDL 34 LDL 149; atorvastatin 80 mg daily, fenofibrate 145 mg 09/29/2016 TC 179 TG 300 HDL 27 VLDL 60 LDL 92; atorvastatin 40 mg daily 05/28/2015 TC 174 TG 240 HDL 23.9 VLDL 48 LDL 120; ???  Past Medical History:  Diagnosis Date   Anxiety    Arthritis    "knees, shoulders, hips, ankles" (07/29/2016)   Asthma    CAD (coronary artery disease)    a. 2017: s/p BMS to distal Cx; b. LHC 05/30/2019: 80% mid, distal RCA s/p DES, 30% narrowing of d LM, widely patent LAD w/ luminal irregularities, widely patent stent in [place]  w/ 90+% stenosis distal to stent beofre small trifurcating obtuse marginal (potentially area of restenosis)   Chronic bronchitis (HCC)    Chronic ulcer of right great toe ([place]) 09/19/2017   COPD (chronic obstructive pulmonary disease) (HCC)    Depression    DVT (deep venous thrombosis) (HCC)    GERD (gastroesophageal reflux disease)    Hyperlipidemia    Hypertension    Myocardial infarction ([place]) 1996   "light one"   PE (pulmonary embolism) 04/2013   On chronic Xarelto   Peripheral nerve disease    Pneumonia "several times"   Type II diabetes mellitus ([place])     Current Outpatient Medications on File Prior to Visit  Medication Sig  Dispense Refill   albuterol (PROAIR HFA) 108 (90 BASE) MCG/ACT inhaler Inhale 2 puffs into the lungs every 6 (six) hours as needed for wheezing or shortness of breath.      ALPRAZolam (XANAX) 1 MG tablet Take 1-2 mg by mouth 4 (four) times daily as needed (anxiety).      atorvastatin (LIPITOR) 80 MG tablet Take 1 tablet (80 mg total) by mouth daily at 6 PM. 30 tablet 2   blood glucose meter kit and supplies KIT Dispense based on patient and insurance preference. Use up to four times daily as directed. (FOR ICD-9 250.00, 250.01). 1 each 0   buPROPion (WELLBUTRIN SR) 150 MG 12 hr tablet Take 150 mg by mouth 2 (two) times daily.   0   carvedilol (COREG) 12.5 MG tablet Take 1 tablet (12.5 mg total) by mouth 2 (two) times daily. 180 tablet 1   clopidogrel (PLAVIX) 75 MG tablet Take 1 tablet (75 mg total) by mouth daily. Please schedule yearly appointment for more refills, thank you! 1st attempt 30 tablet 5   fenofibrate (TRICOR) 145 MG tablet Take 145 mg by mouth daily.      fluconazole (DIFLUCAN) 100 MG tablet Take 100 mg by mouth every Monday.     fluticasone (FLONASE) 50 MCG/ACT nasal spray Place 2 sprays into both nostrils daily as needed for allergies.     HUMALOG KWIKPEN 100 UNIT/ML SOPN Inject 20-45 Units into the skin 3 (three) times daily with meals. sliding scale     hydrochlorothiazide (MICROZIDE) 12.5 MG capsule Take 1 capsule (12.5 mg total) by mouth daily. 30 capsule 6   LANTUS SOLOSTAR 100 UNIT/ML Solostar Pen Inject 70 Units into the skin 2 (two) times daily.      lisinopril (ZESTRIL) 10 MG tablet Take 1 tablet (10 mg total) by mouth daily. 90 tablet 1   meclizine (ANTIVERT) 25 MG tablet Take 25-50 mg by mouth See admin instructions. Take 2 tablets (50 mg) by mouth in the morning & take 1 tablet (25 mg) by mouth at night.     metFORMIN (GLUCOPHAGE) 1000 MG tablet Take 1 tablet (1,000 mg total) by mouth 2 (two) times daily with a meal.     nitroGLYCERIN (NITROSTAT) 0.4 MG  SL tablet Place 1 tablet (0.4 mg total) under the tongue every 5 (five) minutes as needed for chest pain. 25 tablet 3   oxyCODONE (ROXICODONE) 15 MG immediate release tablet Take 15 mg by mouth 5 (five) times daily.      pantoprazole (PROTONIX) 40 MG tablet Take 1 tablet (40 mg total) by mouth daily. (Patient taking differently: Take 80 mg by mouth daily. ) 30 tablet 0   pregabalin (LYRICA) 150 MG capsule Take 150 mg by mouth 3 (three) times daily.  promethazine (PHENERGAN) 25 MG tablet Take 50 mg by mouth every 6 (six) hours as needed for nausea.      Rivaroxaban (XARELTO) 20 MG TABS Take 1 tablet (20 mg total) by mouth daily. (Patient taking differently: Take 20 mg by mouth daily. Take with food) 30 tablet 1   VICTOZA 18 MG/3ML SOPN Inject 1.2 mg into the skin at bedtime.     No current facility-administered medications on file prior to visit.    Allergies  Allergen Reactions   Sulfa Antibiotics Other (See Comments)    headache    Assessment/Plan:  1. Hyperlipidemia - LDL goal < 55 mg/dL; therefore, patient is not at goal. Patient will need ~63% reduction in LDL to lower his LDL to goal. Initiate Praluent 75 mg daily every 14 days (covered via Medicaid??) considering patient is on max dose statin. Continue atorvastatin 80 mg daily. Schedule follow up lipid panel / LFTs for (***). Follow up with patient (***).  2. Hypertension - BP goal < 130/80 mmHg; therefore, patient is not at goal. Continue lisinopril 10 mg daily, carvedilol 12.5 mg BID, HCTZ 12.5 mg daily. Schedule follow up BMP for (***). Follow up with patient (***). Increase lisinopril 10 mg daily, carvedilol 12.5 mg BID, or HCTZ 12.5 mg daily?  Future: spironolactone  Thank you for involving pharmacy to assist in providing Mr. Montecalvo's care.   Drexel Iha, PharmD PGY2 Ambulatory Care Pharmacy Resident

## 2019-12-11 ENCOUNTER — Ambulatory Visit: Payer: Medicare Other

## 2019-12-30 NOTE — Progress Notes (Unsigned)
°  °  °HPI: °Joseph [name] is a 49 y.o. male patient of Dr. LaSalle referred to lipid clinic by Luke Kilroy, PA-C. PMH is significant for CAD (S/P PCI 11/02/2019); DVT (2020), unstable angina, OSA, T2DM, peripheral nerve disease, hx of PE (2014), dyslipidemia, and anxiety. °  °Prior cardiac history has been summarized by Luke Kilroy, PA-C. In 2017 he presented with chest pain. Catheterization was done and he had an 80% distal circumflex.  This was treated with a bare-metal stent.  He had no other significant coronary disease at that time.  Echocardiogram done in August 2017 showed an ejection fraction of 55 to 60% with mild LVH. He was seen in the office in March 2020 with complaints of chest pain and LE edema.  Low dose Coreg was added, he had no LE edema on exam that day in the office.  Echo and Myoview were ordered but delayed secondary to [place] pandemic. He did try low dose nitrate but couldn't tolerate it secondary to headache.  Myoview was eventually done 05/23/2019 and was abnormal with new LVD-EF 40%.  It was decided to proceed with diagnostic cath which was done 05/30/2019.  At cath his previously place CFX stent was patent but he had a distal 90% stenosis (medical Rx).  He also had an 80% mRCA stenosis that was treated with PCI/DES. °  °Patient was recently admitted (11/01/19 - 11/03/19) with chest pain with radiation to his left arm.  Cath showed patent stents in the CFX and RCA and the distal CFX 90% stenosis which was dilated and stented with a DES.  °  °At prior appt with Luke Kilroy, PA-C, LDL was noted to be elevated on atorvastatin 80 mg daily. Also, patient was noted to have elevated BP at office visit with a BP reading of 165/92 mmHg. Patient's carvedilol was increased to 12.5 mg BID and lisinopril increased to 10 mg daily. HCTZ 12.5 mg was continued. Patient was told to consider discontinuing amlodipine to decrease edema. °  °Patient presents today for initial appt with lipid clinic. °  °When  did he start fenofibrate? °What meds was he on in 2016? °Lisinopril/HCTZ - stopped before cath in 2018? Why wasn't he restarted? °  °Current HLD Medications: atorvastatin 80 mg daily °Intolerances: none   °Risk Factors: age, BMI, DM, HTN, ASCVD (multiple stents, angina), smoker?, family history °LDL goal: <55 mg/dL °  °Current HTN Medications: lisinopril 10 mg daily, carvedilol 12.5 mg BID, HCTZ 12.5 mg daily °Intolerances: lisinopril-HCTZ °BP goal: <130/80 mmHg °  °Diet:  °  °Exercise:  °  °Family History: father (HTN); brother (HTN); other family member (HLD); other family member (DM) °  °Social History:  °  °Labs: °11/02/2019 TC 208 TG 168 HDL 25 VLDL 34 LDL 149; atorvastatin 80 mg daily, fenofibrate 145 mg °09/29/2016 TC 179 TG 300 HDL 27 VLDL 60 LDL 92; atorvastatin 40 mg daily °05/28/2015 TC 174 TG 240 HDL 23.9 VLDL 48 LDL 120; ??? ° Renal function: °CrCl cannot be calculated (Patient's most recent lab result is older than the maximum 21 days allowed.). ° °Past Medical History:  °Diagnosis Date  °• Anxiety   °• Arthritis   ° "knees, shoulders, hips, ankles" (07/29/2016)  °• Asthma   °• CAD (coronary artery disease)   ° a. 2017: s/p BMS to distal Cx; b. LHC 05/30/2019: 80% mid, distal RCA s/p DES, 30% narrowing of d LM, widely patent LAD w/ luminal irregularities, widely patent stent in [place]  w/ 90+% stenosis distal to stent beofre small trifurcating obtuse marginal (potentially area of restenosis)  °• Chronic bronchitis (HCC)   °• Chronic ulcer of right great   toe (HCC) 09/19/2017  °• COPD (chronic obstructive pulmonary disease) (HCC)   °• Depression   °• DVT (deep venous thrombosis) (HCC)   °• GERD (gastroesophageal reflux disease)   °• Hyperlipidemia   °• Hypertension   °• Myocardial infarction (HCC) 1996  ° "light one"  °• PE (pulmonary embolism) 04/2013  ° On chronic Xarelto  °• Peripheral nerve disease   °• Pneumonia "several times"  °• Type II diabetes mellitus (HCC)   ° ° °Current Outpatient Medications on  File Prior to Visit  °Medication Sig Dispense Refill  °• albuterol (PROAIR HFA) 108 (90 BASE) MCG/ACT inhaler Inhale 2 puffs into the lungs every 6 (six) hours as needed for wheezing or shortness of breath.     °• ALPRAZolam (XANAX) 1 MG tablet Take 1-2 mg by mouth 4 (four) times daily as needed (anxiety).     °• atorvastatin (LIPITOR) 80 MG tablet Take 1 tablet (80 mg total) by mouth daily at 6 PM. 30 tablet 2  °• blood glucose meter kit and supplies KIT Dispense based on patient and insurance preference. Use up to four times daily as directed. (FOR ICD-9 250.00, 250.01). 1 each 0  °• buPROPion (WELLBUTRIN SR) 150 MG 12 hr tablet Take 150 mg by mouth 2 (two) times daily.   0  °• carvedilol (COREG) 12.5 MG tablet Take 1 tablet (12.5 mg total) by mouth 2 (two) times daily. 180 tablet 1  °• clopidogrel (PLAVIX) 75 MG tablet Take 1 tablet (75 mg total) by mouth daily. Please schedule yearly appointment for more refills, thank you! 1st attempt 30 tablet 5  °• fenofibrate (TRICOR) 145 MG tablet Take 145 mg by mouth daily.     °• fluconazole (DIFLUCAN) 100 MG tablet Take 100 mg by mouth every Monday.    °• fluticasone (FLONASE) 50 MCG/ACT nasal spray Place 2 sprays into both nostrils daily as needed for allergies.    °• HUMALOG KWIKPEN 100 UNIT/ML SOPN Inject 20-45 Units into the skin 3 (three) times daily with meals. sliding scale    °• hydrochlorothiazide (MICROZIDE) 12.5 MG capsule Take 1 capsule (12.5 mg total) by mouth daily. 30 capsule 6  °• LANTUS SOLOSTAR 100 UNIT/ML Solostar Pen Inject 70 Units into the skin 2 (two) times daily.     °• lisinopril (ZESTRIL) 10 MG tablet Take 1 tablet (10 mg total) by mouth daily. 90 tablet 1  °• meclizine (ANTIVERT) 25 MG tablet Take 25-50 mg by mouth See admin instructions. Take 2 tablets (50 mg) by mouth in the morning & take 1 tablet (25 mg) by mouth at night.    °• metFORMIN (GLUCOPHAGE) 1000 MG tablet Take 1 tablet (1,000 mg total) by mouth 2 (two) times daily with a meal.      °• nitroGLYCERIN (NITROSTAT) 0.4 MG SL tablet Place 1 tablet (0.4 mg total) under the tongue every 5 (five) minutes as needed for chest pain. 25 tablet 3  °• oxyCODONE (ROXICODONE) 15 MG immediate release tablet Take 15 mg by mouth 5 (five) times daily.     °• pantoprazole (PROTONIX) 40 MG tablet Take 1 tablet (40 mg total) by mouth daily. (Patient taking differently: Take 80 mg by mouth daily. ) 30 tablet 0  °• pregabalin (LYRICA) 150 MG capsule Take 150 mg by mouth 3 (three) times daily.     °• promethazine (PHENERGAN) 25 MG tablet Take 50 mg by mouth every 6 (six) hours as needed for nausea.     °• Rivaroxaban (XARELTO) 20   MG TABS Take 1 tablet (20 mg total) by mouth daily. (Patient taking differently: Take 20 mg by mouth daily. Take with food) 30 tablet 1  °• VICTOZA 18 MG/3ML SOPN Inject 1.2 mg into the skin at bedtime.    ° °No current facility-administered medications on file prior to visit.  ° ° °Allergies  °Allergen Reactions  °• Sulfa Antibiotics Other (See Comments)  °  headache  °  °Assessment/Plan: °  °1. Hyperlipidemia - LDL goal < 55 mg/dL; therefore, patient is not at goal. Patient will need ~63% reduction in LDL to lower his LDL to goal. Initiate Praluent 75 mg daily every 14 days (covered via Medicaid??) considering patient is on max dose statin. Continue atorvastatin 80 mg daily. Schedule follow up lipid panel / LFTs for (***). Follow up with patient (***). °  °2. Hypertension - BP goal < 130/80 mmHg; therefore, patient is not at goal. Continue lisinopril 10 mg daily, carvedilol 12.5 mg BID, HCTZ 12.5 mg daily. Schedule follow up BMP for (***). Follow up with patient (***). °Increase lisinopril 10 mg daily, carvedilol 12.5 mg BID, or HCTZ 12.5 mg daily?  °Future: spironolactone °  °Thank you for involving pharmacy to assist in providing Mr. Betsch's care.             °  °Mary Taylor, PharmD °PGY2 Ambulatory Care Pharmacy Resident °

## 2020-01-01 ENCOUNTER — Telehealth: Payer: Self-pay | Admitting: Pharmacist

## 2020-01-01 ENCOUNTER — Ambulatory Visit: Payer: Medicare Other

## 2020-01-01 NOTE — Telephone Encounter (Signed)
Called patient on 01/01/2020 at 4:47 PM and left HIPAA-compliant VM with instructions to call [facility] [place] [place] office or [place] office back   Patient did not present to appt today. Called patient to reschedule appt. Will await for patient to return call.  Zachery Conch, PharmD PGY2 Ambulatory Care Pharmacy Resident

## 2020-02-12 ENCOUNTER — Telehealth: Payer: Medicare Other | Admitting: Cardiology

## 2020-03-06 ENCOUNTER — Other Ambulatory Visit: Payer: Self-pay | Admitting: Cardiology

## 2020-03-23 ENCOUNTER — Other Ambulatory Visit: Payer: Self-pay | Admitting: Cardiology

## 2020-03-25 NOTE — Telephone Encounter (Signed)
Not sure why this came to me.  Pt was seen in 11/2019 and HCTZ continued, so refill.

## 2020-03-26 ENCOUNTER — Other Ambulatory Visit: Payer: Self-pay | Admitting: Cardiology

## 2020-04-22 ENCOUNTER — Other Ambulatory Visit: Payer: Self-pay | Admitting: Cardiology

## 2020-04-27 ENCOUNTER — Other Ambulatory Visit: Payer: Self-pay | Admitting: Cardiology

## 2020-06-16 ENCOUNTER — Ambulatory Visit: Payer: Medicare Other | Admitting: Cardiology

## 2020-07-17 ENCOUNTER — Inpatient Hospital Stay (HOSPITAL_COMMUNITY)
Admission: EM | Admit: 2020-07-17 | Discharge: 2020-07-31 | DRG: 853 | Disposition: A | Discharge status: Home / Self Care | Payer: Medicare Other | Attending: Thoracic Surgery (Cardiothoracic Vascular Surgery) | Admitting: Thoracic Surgery (Cardiothoracic Vascular Surgery)

## 2020-07-17 ENCOUNTER — Other Ambulatory Visit: Payer: Self-pay

## 2020-07-17 DIAGNOSIS — Z8249 Family history of ischemic heart disease and other diseases of the circulatory system: Secondary | ICD-10-CM

## 2020-07-17 DIAGNOSIS — M19011 Primary osteoarthritis, right shoulder: Secondary | ICD-10-CM | POA: Diagnosis present

## 2020-07-17 DIAGNOSIS — M170 Bilateral primary osteoarthritis of knee: Secondary | ICD-10-CM | POA: Diagnosis present

## 2020-07-17 DIAGNOSIS — D62 Acute posthemorrhagic anemia: Secondary | ICD-10-CM | POA: Diagnosis not present

## 2020-07-17 DIAGNOSIS — M19071 Primary osteoarthritis, right ankle and foot: Secondary | ICD-10-CM | POA: Diagnosis present

## 2020-07-17 DIAGNOSIS — R778 Other specified abnormalities of plasma proteins: Secondary | ICD-10-CM | POA: Diagnosis not present

## 2020-07-17 DIAGNOSIS — I2584 Coronary atherosclerosis due to calcified coronary lesion: Secondary | ICD-10-CM | POA: Diagnosis present

## 2020-07-17 DIAGNOSIS — E785 Hyperlipidemia, unspecified: Secondary | ICD-10-CM | POA: Diagnosis present

## 2020-07-17 DIAGNOSIS — F419 Anxiety disorder, unspecified: Secondary | ICD-10-CM | POA: Diagnosis present

## 2020-07-17 DIAGNOSIS — I2510 Atherosclerotic heart disease of native coronary artery without angina pectoris: Secondary | ICD-10-CM

## 2020-07-17 DIAGNOSIS — Z8701 Personal history of pneumonia (recurrent): Secondary | ICD-10-CM

## 2020-07-17 DIAGNOSIS — Z7902 Long term (current) use of antithrombotics/antiplatelets: Secondary | ICD-10-CM

## 2020-07-17 DIAGNOSIS — Z6841 Body Mass Index (BMI) 40.0 and over, adult: Secondary | ICD-10-CM

## 2020-07-17 DIAGNOSIS — Z9861 Coronary angioplasty status: Secondary | ICD-10-CM

## 2020-07-17 DIAGNOSIS — I110 Hypertensive heart disease with heart failure: Secondary | ICD-10-CM | POA: Diagnosis present

## 2020-07-17 DIAGNOSIS — K219 Gastro-esophageal reflux disease without esophagitis: Secondary | ICD-10-CM | POA: Diagnosis present

## 2020-07-17 DIAGNOSIS — I25110 Atherosclerotic heart disease of native coronary artery with unstable angina pectoris: Secondary | ICD-10-CM | POA: Diagnosis present

## 2020-07-17 DIAGNOSIS — N1832 Chronic kidney disease, stage 3b: Secondary | ICD-10-CM | POA: Clinically undetermined

## 2020-07-17 DIAGNOSIS — Z86718 Personal history of other venous thrombosis and embolism: Secondary | ICD-10-CM

## 2020-07-17 DIAGNOSIS — M19012 Primary osteoarthritis, left shoulder: Secondary | ICD-10-CM | POA: Diagnosis present

## 2020-07-17 DIAGNOSIS — E11621 Type 2 diabetes mellitus with foot ulcer: Secondary | ICD-10-CM | POA: Diagnosis present

## 2020-07-17 DIAGNOSIS — Z86711 Personal history of pulmonary embolism: Secondary | ICD-10-CM | POA: Diagnosis present

## 2020-07-17 DIAGNOSIS — I5033 Acute on chronic diastolic (congestive) heart failure: Secondary | ICD-10-CM | POA: Diagnosis present

## 2020-07-17 DIAGNOSIS — E1165 Type 2 diabetes mellitus with hyperglycemia: Secondary | ICD-10-CM | POA: Diagnosis present

## 2020-07-17 DIAGNOSIS — Z79891 Long term (current) use of opiate analgesic: Secondary | ICD-10-CM

## 2020-07-17 DIAGNOSIS — I252 Old myocardial infarction: Secondary | ICD-10-CM

## 2020-07-17 DIAGNOSIS — Z882 Allergy status to sulfonamides status: Secondary | ICD-10-CM

## 2020-07-17 DIAGNOSIS — E1140 Type 2 diabetes mellitus with diabetic neuropathy, unspecified: Secondary | ICD-10-CM | POA: Diagnosis present

## 2020-07-17 DIAGNOSIS — F17210 Nicotine dependence, cigarettes, uncomplicated: Secondary | ICD-10-CM | POA: Diagnosis present

## 2020-07-17 DIAGNOSIS — N179 Acute kidney failure, unspecified: Secondary | ICD-10-CM | POA: Diagnosis present

## 2020-07-17 DIAGNOSIS — Z951 Presence of aortocoronary bypass graft: Secondary | ICD-10-CM

## 2020-07-17 DIAGNOSIS — Z794 Long term (current) use of insulin: Secondary | ICD-10-CM

## 2020-07-17 DIAGNOSIS — J9811 Atelectasis: Secondary | ICD-10-CM

## 2020-07-17 DIAGNOSIS — Z955 Presence of coronary angioplasty implant and graft: Secondary | ICD-10-CM

## 2020-07-17 DIAGNOSIS — L03116 Cellulitis of left lower limb: Secondary | ICD-10-CM | POA: Diagnosis present

## 2020-07-17 DIAGNOSIS — Z89411 Acquired absence of right great toe: Secondary | ICD-10-CM

## 2020-07-17 DIAGNOSIS — Z833 Family history of diabetes mellitus: Secondary | ICD-10-CM

## 2020-07-17 DIAGNOSIS — M19072 Primary osteoarthritis, left ankle and foot: Secondary | ICD-10-CM | POA: Diagnosis present

## 2020-07-17 DIAGNOSIS — R000 Tachycardia, unspecified: Secondary | ICD-10-CM | POA: Diagnosis present

## 2020-07-17 DIAGNOSIS — Z560 Unemployment, unspecified: Secondary | ICD-10-CM

## 2020-07-17 DIAGNOSIS — G894 Chronic pain syndrome: Secondary | ICD-10-CM | POA: Diagnosis present

## 2020-07-17 DIAGNOSIS — D72829 Elevated white blood cell count, unspecified: Secondary | ICD-10-CM | POA: Diagnosis present

## 2020-07-17 DIAGNOSIS — E119 Type 2 diabetes mellitus without complications: Secondary | ICD-10-CM

## 2020-07-17 DIAGNOSIS — I214 Non-ST elevation (NSTEMI) myocardial infarction: Secondary | ICD-10-CM | POA: Diagnosis present

## 2020-07-17 DIAGNOSIS — Z7901 Long term (current) use of anticoagulants: Secondary | ICD-10-CM

## 2020-07-17 DIAGNOSIS — I5030 Unspecified diastolic (congestive) heart failure: Secondary | ICD-10-CM | POA: Diagnosis present

## 2020-07-17 DIAGNOSIS — I959 Hypotension, unspecified: Secondary | ICD-10-CM | POA: Diagnosis present

## 2020-07-17 DIAGNOSIS — Z79899 Other long term (current) drug therapy: Secondary | ICD-10-CM

## 2020-07-17 DIAGNOSIS — A419 Sepsis, unspecified organism: Secondary | ICD-10-CM

## 2020-07-17 DIAGNOSIS — G4733 Obstructive sleep apnea (adult) (pediatric): Secondary | ICD-10-CM | POA: Diagnosis present

## 2020-07-17 DIAGNOSIS — M549 Dorsalgia, unspecified: Secondary | ICD-10-CM | POA: Diagnosis present

## 2020-07-17 DIAGNOSIS — I509 Heart failure, unspecified: Secondary | ICD-10-CM | POA: Diagnosis present

## 2020-07-17 DIAGNOSIS — M7989 Other specified soft tissue disorders: Secondary | ICD-10-CM | POA: Diagnosis present

## 2020-07-17 DIAGNOSIS — E11628 Type 2 diabetes mellitus with other skin complications: Secondary | ICD-10-CM | POA: Diagnosis present

## 2020-07-17 DIAGNOSIS — R7989 Other specified abnormal findings of blood chemistry: Secondary | ICD-10-CM | POA: Diagnosis present

## 2020-07-17 DIAGNOSIS — J90 Pleural effusion, not elsewhere classified: Secondary | ICD-10-CM

## 2020-07-17 DIAGNOSIS — E538 Deficiency of other specified B group vitamins: Secondary | ICD-10-CM | POA: Diagnosis present

## 2020-07-17 DIAGNOSIS — I248 Other forms of acute ischemic heart disease: Secondary | ICD-10-CM | POA: Diagnosis present

## 2020-07-17 DIAGNOSIS — J449 Chronic obstructive pulmonary disease, unspecified: Secondary | ICD-10-CM | POA: Diagnosis present

## 2020-07-17 DIAGNOSIS — F329 Major depressive disorder, single episode, unspecified: Secondary | ICD-10-CM | POA: Diagnosis present

## 2020-07-17 DIAGNOSIS — Z20822 Contact with and (suspected) exposure to covid-19: Secondary | ICD-10-CM | POA: Diagnosis present

## 2020-07-17 DIAGNOSIS — R739 Hyperglycemia, unspecified: Secondary | ICD-10-CM | POA: Diagnosis present

## 2020-07-17 DIAGNOSIS — R6520 Severe sepsis without septic shock: Secondary | ICD-10-CM | POA: Diagnosis present

## 2020-07-17 DIAGNOSIS — D649 Anemia, unspecified: Secondary | ICD-10-CM

## 2020-07-17 DIAGNOSIS — M160 Bilateral primary osteoarthritis of hip: Secondary | ICD-10-CM | POA: Diagnosis present

## 2020-07-17 DIAGNOSIS — E611 Iron deficiency: Secondary | ICD-10-CM | POA: Diagnosis present

## 2020-07-17 NOTE — ED Triage Notes (Signed)
Left chest pain started 24 hours ago, states has been off and on all day.  Pt states he also feels light headed, dizzy at times.

## 2020-07-18 ENCOUNTER — Encounter (HOSPITAL_COMMUNITY): Payer: Self-pay | Admitting: Internal Medicine

## 2020-07-18 ENCOUNTER — Emergency Department (HOSPITAL_COMMUNITY): Payer: Medicare Other

## 2020-07-18 ENCOUNTER — Observation Stay (HOSPITAL_BASED_OUTPATIENT_CLINIC_OR_DEPARTMENT_OTHER): Payer: Medicare Other

## 2020-07-18 ENCOUNTER — Observation Stay (HOSPITAL_COMMUNITY): Payer: Medicare Other

## 2020-07-18 DIAGNOSIS — R079 Chest pain, unspecified: Secondary | ICD-10-CM | POA: Diagnosis not present

## 2020-07-18 DIAGNOSIS — A419 Sepsis, unspecified organism: Secondary | ICD-10-CM | POA: Diagnosis not present

## 2020-07-18 DIAGNOSIS — I5031 Acute diastolic (congestive) heart failure: Secondary | ICD-10-CM | POA: Diagnosis not present

## 2020-07-18 DIAGNOSIS — Z86711 Personal history of pulmonary embolism: Secondary | ICD-10-CM

## 2020-07-18 DIAGNOSIS — Z794 Long term (current) use of insulin: Secondary | ICD-10-CM

## 2020-07-18 DIAGNOSIS — N1832 Chronic kidney disease, stage 3b: Secondary | ICD-10-CM | POA: Clinically undetermined

## 2020-07-18 DIAGNOSIS — I959 Hypotension, unspecified: Secondary | ICD-10-CM | POA: Diagnosis present

## 2020-07-18 DIAGNOSIS — I2510 Atherosclerotic heart disease of native coronary artery without angina pectoris: Secondary | ICD-10-CM

## 2020-07-18 DIAGNOSIS — J449 Chronic obstructive pulmonary disease, unspecified: Secondary | ICD-10-CM

## 2020-07-18 DIAGNOSIS — R778 Other specified abnormalities of plasma proteins: Secondary | ICD-10-CM

## 2020-07-18 DIAGNOSIS — N179 Acute kidney failure, unspecified: Secondary | ICD-10-CM | POA: Diagnosis not present

## 2020-07-18 DIAGNOSIS — I248 Other forms of acute ischemic heart disease: Secondary | ICD-10-CM

## 2020-07-18 DIAGNOSIS — K219 Gastro-esophageal reflux disease without esophagitis: Secondary | ICD-10-CM

## 2020-07-18 DIAGNOSIS — R6520 Severe sepsis without septic shock: Secondary | ICD-10-CM | POA: Diagnosis not present

## 2020-07-18 DIAGNOSIS — D72829 Elevated white blood cell count, unspecified: Secondary | ICD-10-CM

## 2020-07-18 DIAGNOSIS — Z9861 Coronary angioplasty status: Secondary | ICD-10-CM

## 2020-07-18 DIAGNOSIS — M7989 Other specified soft tissue disorders: Secondary | ICD-10-CM | POA: Diagnosis present

## 2020-07-18 DIAGNOSIS — E785 Hyperlipidemia, unspecified: Secondary | ICD-10-CM

## 2020-07-18 DIAGNOSIS — G4733 Obstructive sleep apnea (adult) (pediatric): Secondary | ICD-10-CM

## 2020-07-18 DIAGNOSIS — I25119 Atherosclerotic heart disease of native coronary artery with unspecified angina pectoris: Secondary | ICD-10-CM | POA: Diagnosis not present

## 2020-07-18 DIAGNOSIS — R7989 Other specified abnormal findings of blood chemistry: Secondary | ICD-10-CM | POA: Diagnosis present

## 2020-07-18 DIAGNOSIS — R739 Hyperglycemia, unspecified: Secondary | ICD-10-CM

## 2020-07-18 DIAGNOSIS — E119 Type 2 diabetes mellitus without complications: Secondary | ICD-10-CM

## 2020-07-18 LAB — CBC WITH DIFFERENTIAL/PLATELET
Abs Immature Granulocytes: 0.12 10*3/uL — ABNORMAL HIGH (ref 0.00–0.07)
Basophils Absolute: 0 10*3/uL (ref 0.0–0.1)
Basophils Relative: 0 %
Eosinophils Absolute: 0.1 10*3/uL (ref 0.0–0.5)
Eosinophils Relative: 1 %
HCT: 31.9 % — ABNORMAL LOW (ref 39.0–52.0)
Hemoglobin: 10 g/dL — ABNORMAL LOW (ref 13.0–17.0)
Immature Granulocytes: 1 %
Lymphocytes Relative: 13 %
Lymphs Abs: 1.7 10*3/uL (ref 0.7–4.0)
MCH: 29.6 pg (ref 26.0–34.0)
MCHC: 31.3 g/dL (ref 30.0–36.0)
MCV: 94.4 fL (ref 80.0–100.0)
Monocytes Absolute: 1.1 10*3/uL — ABNORMAL HIGH (ref 0.1–1.0)
Monocytes Relative: 9 %
Neutro Abs: 10.2 10*3/uL — ABNORMAL HIGH (ref 1.7–7.7)
Neutrophils Relative %: 76 %
Platelets: 204 10*3/uL (ref 150–400)
RBC: 3.38 MIL/uL — ABNORMAL LOW (ref 4.22–5.81)
RDW: 14 % (ref 11.5–15.5)
WBC: 13.2 10*3/uL — ABNORMAL HIGH (ref 4.0–10.5)
nRBC: 0 % (ref 0.0–0.2)

## 2020-07-18 LAB — BASIC METABOLIC PANEL
Anion gap: 7 (ref 5–15)
BUN: 59 mg/dL — ABNORMAL HIGH (ref 6–20)
CO2: 20 mmol/L — ABNORMAL LOW (ref 22–32)
Calcium: 7.7 mg/dL — ABNORMAL LOW (ref 8.9–10.3)
Chloride: 107 mmol/L (ref 98–111)
Creatinine, Ser: 3.53 mg/dL — ABNORMAL HIGH (ref 0.61–1.24)
GFR calc Af Amer: 23 mL/min — ABNORMAL LOW (ref >60)
GFR calc non Af Amer: 19 mL/min — ABNORMAL LOW (ref >60)
Glucose, Bld: 281 mg/dL — ABNORMAL HIGH (ref 70–99)
Potassium: 5.3 mmol/L — ABNORMAL HIGH (ref 3.5–5.1)
Sodium: 134 mmol/L — ABNORMAL LOW (ref 135–145)

## 2020-07-18 LAB — BRAIN NATRIURETIC PEPTIDE: B Natriuretic Peptide: 199 pg/mL — ABNORMAL HIGH (ref 0.0–100.0)

## 2020-07-18 LAB — MRSA PCR SCREENING: MRSA by PCR: NEGATIVE

## 2020-07-18 LAB — GLUCOSE, CAPILLARY
Glucose-Capillary: 256 mg/dL — ABNORMAL HIGH (ref 70–99)
Glucose-Capillary: 355 mg/dL — ABNORMAL HIGH (ref 70–99)

## 2020-07-18 LAB — PHOSPHORUS: Phosphorus: 3.9 mg/dL (ref 2.5–4.6)

## 2020-07-18 LAB — ECHOCARDIOGRAM COMPLETE
Area-P 1/2: 2.62 cm2
Height: 77 in
S' Lateral: 4.09 cm
Weight: 5600 oz

## 2020-07-18 LAB — CBC
HCT: 33.8 % — ABNORMAL LOW (ref 39.0–52.0)
Hemoglobin: 10.4 g/dL — ABNORMAL LOW (ref 13.0–17.0)
MCH: 30 pg (ref 26.0–34.0)
MCHC: 30.8 g/dL (ref 30.0–36.0)
MCV: 97.4 fL (ref 80.0–100.0)
Platelets: 216 10*3/uL (ref 150–400)
RBC: 3.47 MIL/uL — ABNORMAL LOW (ref 4.22–5.81)
RDW: 14 % (ref 11.5–15.5)
WBC: 13.8 10*3/uL — ABNORMAL HIGH (ref 4.0–10.5)
nRBC: 0 % (ref 0.0–0.2)

## 2020-07-18 LAB — HEMOGLOBIN A1C
Hgb A1c MFr Bld: 9.4 % — ABNORMAL HIGH (ref 4.8–5.6)
Mean Plasma Glucose: 223.08 mg/dL

## 2020-07-18 LAB — APTT
aPTT: 70 seconds — ABNORMAL HIGH (ref 24–36)
aPTT: 52 seconds — ABNORMAL HIGH (ref 24–36)
aPTT: 54 seconds — ABNORMAL HIGH (ref 24–36)

## 2020-07-18 LAB — MAGNESIUM: Magnesium: 1.4 mg/dL — ABNORMAL LOW (ref 1.7–2.4)

## 2020-07-18 LAB — HEPARIN LEVEL (UNFRACTIONATED)
Heparin Unfractionated: 2 IU/mL — ABNORMAL HIGH (ref 0.30–0.70)
Heparin Unfractionated: >2.2 IU/mL — ABNORMAL HIGH (ref 0.30–0.70)

## 2020-07-18 LAB — CBG MONITORING, ED
Glucose-Capillary: 230 mg/dL — ABNORMAL HIGH (ref 70–99)
Glucose-Capillary: 318 mg/dL — ABNORMAL HIGH (ref 70–99)

## 2020-07-18 LAB — TROPONIN I (HIGH SENSITIVITY)
Troponin I (High Sensitivity): 553 ng/L (ref <18)
Troponin I (High Sensitivity): 528 ng/L (ref <18)

## 2020-07-18 LAB — SARS CORONAVIRUS 2 BY RT PCR (HOSPITAL ORDER, PERFORMED IN ~~LOC~~ HOSPITAL LAB): SARS Coronavirus 2: NEGATIVE

## 2020-07-18 LAB — LACTIC ACID, PLASMA: Lactic Acid, Venous: 1.5 mmol/L (ref 0.5–1.9)

## 2020-07-18 MED ORDER — INSULIN GLARGINE 100 UNIT/ML ~~LOC~~ SOLN
40.0000 [IU] | Freq: Two times a day (BID) | SUBCUTANEOUS | Status: DC
  Administered 2020-07-18 – 2020-07-23 (×11): 40 [IU] via SUBCUTANEOUS
  Filled 2020-07-18 (×15): qty 0.4

## 2020-07-18 MED ORDER — PERFLUTREN LIPID MICROSPHERE
1.0000 mL | INTRAVENOUS | Status: AC | PRN
  Administered 2020-07-18: 1 mL via INTRAVENOUS
  Filled 2020-07-18: qty 10

## 2020-07-18 MED ORDER — LACTATED RINGERS IV SOLN: Freq: Once | INTRAVENOUS | Status: AC

## 2020-07-18 MED ORDER — BUPROPION HCL ER (SR) 150 MG PO TB12
150.0000 mg | ORAL_TABLET | Freq: Two times a day (BID) | ORAL | Status: DC
  Administered 2020-07-18 – 2020-07-23 (×11): 150 mg via ORAL
  Filled 2020-07-18 (×12): qty 1

## 2020-07-18 MED ORDER — HEPARIN (PORCINE) 25000 UT/250ML-% IV SOLN
2750.0000 [IU]/h | INTRAVENOUS | Status: DC
  Administered 2020-07-18: 1750 [IU]/h via INTRAVENOUS
  Administered 2020-07-18: 1500 [IU]/h via INTRAVENOUS
  Administered 2020-07-19: 2000 [IU]/h via INTRAVENOUS
  Administered 2020-07-20: 2500 [IU]/h via INTRAVENOUS
  Administered 2020-07-21: 2750 [IU]/h via INTRAVENOUS
  Administered 2020-07-21: 2500 [IU]/h via INTRAVENOUS
  Administered 2020-07-21: 2750 [IU]/h via INTRAVENOUS
  Filled 2020-07-18 (×8): qty 250

## 2020-07-18 MED ORDER — ASPIRIN 81 MG PO CHEW
324.0000 mg | CHEWABLE_TABLET | Freq: Once | ORAL | Status: AC
  Administered 2020-07-18: 324 mg via ORAL
  Filled 2020-07-18: qty 4

## 2020-07-18 MED ORDER — FLUCONAZOLE 100 MG PO TABS
100.0000 mg | ORAL_TABLET | ORAL | Status: DC
  Administered 2020-07-21 – 2020-07-28 (×2): 100 mg via ORAL
  Filled 2020-07-18 (×3): qty 1

## 2020-07-18 MED ORDER — FLUTICASONE PROPIONATE 50 MCG/ACT NA SUSP
2.0000 | Freq: Every day | NASAL | Status: DC | PRN
  Administered 2020-07-21: 2 via NASAL
  Filled 2020-07-18: qty 16

## 2020-07-18 MED ORDER — CARVEDILOL 3.125 MG PO TABS
3.1250 mg | ORAL_TABLET | Freq: Two times a day (BID) | ORAL | Status: DC
  Administered 2020-07-18 – 2020-07-23 (×11): 3.125 mg via ORAL
  Filled 2020-07-18 (×11): qty 1

## 2020-07-18 MED ORDER — FENOFIBRATE 54 MG PO TABS
54.0000 mg | ORAL_TABLET | Freq: Every day | ORAL | Status: DC
  Administered 2020-07-19 – 2020-07-23 (×5): 54 mg via ORAL
  Filled 2020-07-18 (×7): qty 1

## 2020-07-18 MED ORDER — INSULIN ASPART 100 UNIT/ML ~~LOC~~ SOLN
0.0000 [IU] | Freq: Every day | SUBCUTANEOUS | Status: DC
  Administered 2020-07-18: 5 [IU] via SUBCUTANEOUS
  Administered 2020-07-19: 2 [IU] via SUBCUTANEOUS
  Administered 2020-07-20: 3 [IU] via SUBCUTANEOUS
  Administered 2020-07-21 – 2020-07-23 (×3): 2 [IU] via SUBCUTANEOUS

## 2020-07-18 MED ORDER — CLOPIDOGREL BISULFATE 75 MG PO TABS
75.0000 mg | ORAL_TABLET | Freq: Every day | ORAL | Status: DC
  Administered 2020-07-18 – 2020-07-22 (×5): 75 mg via ORAL
  Filled 2020-07-18 (×5): qty 1

## 2020-07-18 MED ORDER — ESCITALOPRAM OXALATE 10 MG PO TABS
10.0000 mg | ORAL_TABLET | Freq: Every day | ORAL | Status: DC
  Administered 2020-07-18 – 2020-07-23 (×6): 10 mg via ORAL
  Filled 2020-07-18 (×6): qty 1

## 2020-07-18 MED ORDER — SODIUM CHLORIDE 0.9 % IV BOLUS
1000.0000 mL | Freq: Once | INTRAVENOUS | Status: AC
  Administered 2020-07-18: 1000 mL via INTRAVENOUS

## 2020-07-18 MED ORDER — PANTOPRAZOLE SODIUM 40 MG PO TBEC
40.0000 mg | DELAYED_RELEASE_TABLET | Freq: Every day | ORAL | Status: DC
  Administered 2020-07-18 – 2020-07-23 (×6): 40 mg via ORAL
  Filled 2020-07-18 (×7): qty 1

## 2020-07-18 MED ORDER — DOXYCYCLINE HYCLATE 100 MG PO TABS
100.0000 mg | ORAL_TABLET | Freq: Two times a day (BID) | ORAL | Status: DC
  Administered 2020-07-18 – 2020-07-23 (×11): 100 mg via ORAL
  Filled 2020-07-18 (×11): qty 1

## 2020-07-18 MED ORDER — SODIUM POLYSTYRENE SULFONATE 15 GM/60ML PO SUSP
15.0000 g | Freq: Once | ORAL | Status: AC
  Administered 2020-07-18: 15 g via ORAL
  Filled 2020-07-18: qty 60

## 2020-07-18 MED ORDER — ALBUTEROL SULFATE (2.5 MG/3ML) 0.083% IN NEBU: 2.5000 mg | INHALATION_SOLUTION | Freq: Four times a day (QID) | RESPIRATORY_TRACT | Status: DC | PRN

## 2020-07-18 MED ORDER — SODIUM CHLORIDE 0.9 % IV SOLN
2.0000 g | INTRAVENOUS | Status: DC
  Administered 2020-07-18 – 2020-07-23 (×6): 2 g via INTRAVENOUS
  Filled 2020-07-18 (×7): qty 20
  Filled 2020-07-18: qty 2

## 2020-07-18 MED ORDER — OXYCODONE HCL 5 MG PO TABS
15.0000 mg | ORAL_TABLET | Freq: Three times a day (TID) | ORAL | Status: DC | PRN
  Administered 2020-07-18 – 2020-07-23 (×8): 15 mg via ORAL
  Filled 2020-07-18 (×9): qty 3

## 2020-07-18 MED ORDER — ALBUTEROL SULFATE HFA 108 (90 BASE) MCG/ACT IN AERS: 2.0000 | INHALATION_SPRAY | Freq: Four times a day (QID) | RESPIRATORY_TRACT | Status: DC | PRN

## 2020-07-18 MED ORDER — PREGABALIN 75 MG PO CAPS
150.0000 mg | ORAL_CAPSULE | Freq: Three times a day (TID) | ORAL | Status: DC
  Administered 2020-07-18 – 2020-07-23 (×16): 150 mg via ORAL
  Filled 2020-07-18 (×16): qty 2

## 2020-07-18 MED ORDER — ALPRAZOLAM 0.5 MG PO TABS
1.0000 mg | ORAL_TABLET | Freq: Four times a day (QID) | ORAL | Status: DC | PRN
  Administered 2020-07-18 – 2020-07-23 (×3): 2 mg via ORAL
  Filled 2020-07-18 (×3): qty 4

## 2020-07-18 MED ORDER — TECHNETIUM TO 99M ALBUMIN AGGREGATED
4.4000 | Freq: Once | INTRAVENOUS | Status: AC | PRN
  Administered 2020-07-18: 4.4 via INTRAVENOUS

## 2020-07-18 MED ORDER — INSULIN ASPART 100 UNIT/ML ~~LOC~~ SOLN
0.0000 [IU] | Freq: Three times a day (TID) | SUBCUTANEOUS | Status: DC
  Administered 2020-07-18: 8 [IU] via SUBCUTANEOUS
  Administered 2020-07-18: 11 [IU] via SUBCUTANEOUS
  Administered 2020-07-19: 8 [IU] via SUBCUTANEOUS
  Administered 2020-07-19: 5 [IU] via SUBCUTANEOUS
  Administered 2020-07-19 – 2020-07-20 (×2): 8 [IU] via SUBCUTANEOUS
  Administered 2020-07-20: 5 [IU] via SUBCUTANEOUS
  Administered 2020-07-20: 8 [IU] via SUBCUTANEOUS
  Administered 2020-07-21 (×2): 3 [IU] via SUBCUTANEOUS
  Administered 2020-07-21: 8 [IU] via SUBCUTANEOUS
  Administered 2020-07-22 – 2020-07-24 (×4): 2 [IU] via SUBCUTANEOUS
  Filled 2020-07-18: qty 1

## 2020-07-18 MED ORDER — CHLORHEXIDINE GLUCONATE CLOTH 2 % EX PADS
6.0000 | MEDICATED_PAD | Freq: Every day | CUTANEOUS | Status: DC
  Administered 2020-07-18 – 2020-07-23 (×3): 6 via TOPICAL

## 2020-07-18 MED ORDER — ATORVASTATIN CALCIUM 80 MG PO TABS
80.0000 mg | ORAL_TABLET | Freq: Every day | ORAL | Status: DC
  Administered 2020-07-18 – 2020-07-30 (×12): 80 mg via ORAL
  Filled 2020-07-18 (×2): qty 1
  Filled 2020-07-18: qty 2
  Filled 2020-07-18: qty 1
  Filled 2020-07-18 (×2): qty 2
  Filled 2020-07-18 (×3): qty 1
  Filled 2020-07-18: qty 2
  Filled 2020-07-18 (×2): qty 1
  Filled 2020-07-18: qty 2
  Filled 2020-07-18: qty 1

## 2020-07-18 NOTE — Progress Notes (Addendum)
Patient seen and evaluated, chart reviewed, please see EMR for updated orders. Please see full H&P dictated by admitting physician Dr. Josephine Cables for same date of service.    Brief Summary:- 49 y.o. male with past medical history of morbid obesity/OSA, history of CAD (s/p BMS to LCx in 2017, DES to distal RCA in 05/2019, repeat cath in 10/2019 with DES to distal LCx with patent stents along RCA and mid-LCx), history of DVT/PE (on Xarelto), HTN, HLD and tobacco use admitted on 07/18/2020 with atypical chest pains, troponin elevation, concerns about CHF and sepsis secondary to left lower extremity cellulitis with hypotension and AKI   A/p 1)Severe sepsis secondary to left lower extremity cellulitis--- treat empirically with IV Rocephin and doxycycline pending cultures -Patient met sepsis criteria on admission with leukocytosis, tachycardia, tachypnea with concomitant persistent hypotension and AKI --Tachycardia may not already been as prominent as it could be due to Coreg use PTA -Initial lactic acid was not elevated -Sepsis will explain patient's hypotension and AKI -Bps improved, decrease Coreg to 3.125 twice daily from 2.5 mg twice daily, discontinued lisinopril HCTZ due to soft BP and AKI   2)AKI----acute kidney injury due to persistent hypotension in the setting of severe sepsis as above #1 compounded by lisinopril/HCTZ use --   creatinine on admission= 3.53 , baseline creatinine =1.1 to 1.2  -- renally adjust medications, avoid nephrotoxic agents / dehydration  / hypotension -Stop Metformin -Stop lisinopril and HCTZ  3) left lower extremity cellulitis----please see photos in epic, treat as above #1 pending blood cultures -Lower extremity  venous Dopplers negative for DVT  4)Morbid Obesity AND OSA -Low calorie diet, portion control and increase physical activity discussed with patient -CPAP use advised -Body mass index is 45.36 kg/m.   5) atypical chest pains AND h/o CAD--prior  angioplasty and stents--- cardiology consult appreciated, echo from 07/18/2020 with EF of 50 to 55%,  -Elevated troponin noted -EKG not consistent with ACS -Continue IV heparin -Coreg adjusted as above #1 due to soft BP -Plavix as ordered, Lipitor as ordered -Per cardiology service not a good candidate for LHC due to kidney function  6)H/o Prior DVT/PE-lower extremity venous Dopplers negative for DVT, -VQ scan without PE -Hold Xarelto, currently on IV heparin as above in #5  7) chronic pain syndrome--- continue home opiate regimen   -Total care time over 41 minutes including time spent coordinating care with cardiology service   Patient seen and evaluated, chart reviewed, please see EMR for updated orders. Please see full H&P dictated by admitting physician Dr. Josephine Cables for same date of service.   Roxan Hockey, MD

## 2020-07-18 NOTE — ED Notes (Signed)
Cardiology at bedside at this time

## 2020-07-18 NOTE — ED Provider Notes (Signed)
[facility] EMERGENCY DEPARTMENT Provider Note   CSN: [id number] Arrival date & time: 07/17/20  2340   History Chief Complaint  Patient presents with  . Chest Pain    Joseph [name] is a 49 y.o. male.  The history is provided by the patient.  Chest Pain He has history of hypertension, diabetes, hyperlipidemia, coronary artery disease status post stent placement, pulmonary embolism anticoagulated on rivaroxaban and comes in because of feeling weak since yesterday.  He is also feeling somewhat lightheaded.  Blood pressure at home has been low, and tonight got as low as 79/50.  He has had some headache and some tightness in his chest.  There has been some mild dyspnea.  He denies diaphoresis.  He has noted that his left leg has become significantly swollen over the last week.  He has been compliant with his medications.  Past Medical History:  Diagnosis Date  . Anxiety   . Arthritis    "knees, shoulders, hips, ankles" (07/29/2016)  . Asthma   . CAD (coronary artery disease)    a. 2017: s/p BMS to distal Cx; b. LHC 05/30/2019: 80% mid, distal RCA s/p DES, 30% narrowing of d LM, widely patent LAD w/ luminal irregularities, widely patent stent in [place]  w/ 90+% stenosis distal to stent beofre small trifurcating obtuse marginal (potentially area of restenosis)  . Chronic bronchitis ([place])   . Chronic ulcer of right great toe ([place]) 09/19/2017  . COPD (chronic obstructive pulmonary disease) ([place])   . Depression   . DVT (deep venous thrombosis) ([place])   . GERD (gastroesophageal reflux disease)   . Hyperlipidemia   . Hypertension   . Myocardial infarction ([place]) 1996   "light one"  . PE (pulmonary embolism) 04/2013   On chronic Xarelto  . Peripheral nerve disease   . Pneumonia "several times"  . Type II diabetes mellitus [facility])     Patient Active Problem List   Diagnosis Date Noted  . Unstable angina ([place]) 11/01/2019  . Edema 06/13/2019  . Leukocytosis 05/31/2019  . Smoker 03/08/2019    . Chronic pain 03/08/2019  . Chronic back pain 02/16/2019  . Deep venous thrombosis ([place]) 02/16/2019  . Morbid obesity ([place]) 02/16/2019  . Peripheral nerve disease 02/16/2019  . Chronic obstructive lung disease ([place]) 01/22/2019  . Anxiety disorder 03/26/2018  . Open wound of toe 10/12/2017  . Sinusitis 10/12/2017  . Abdominal pain   . Loss of weight   . Diabetic foot infection ([place]) 09/16/2017  . Uncontrolled type 2 diabetes mellitus with hyperglycemia, with long-term current use of insulin ([place]) 09/16/2017  . Cellulitis of right foot   . Chronic ulcer of great toe of right foot ([place]) 09/09/2017  . Recurrent chest pain 07/29/2016  . Hyperglycemia   . Insulin dependent type 2 diabetes mellitus ([place]) 04/29/2015  . OSA (obstructive sleep apnea) 05/08/2013  . History of pulmonary embolus (PE) 04/27/2013  . CAD -S/P PCI 11/02/19  09/10/2008  . Dyslipidemia 09/09/2008    Past Surgical History:  Procedure Laterality Date  . AMPUTATION Right 09/21/2017   Procedure: RIGHT GREAT TOE AMPUTATION, POSSIBLE VAC;  Surgeon: Leandrew Koyanagi, MD;  Location: [place];  Service: Orthopedics;  Laterality: Right;  . CARDIAC CATHETERIZATION  2006  . CARDIAC CATHETERIZATION  1996   "@ [facility]; when I had my heart attack"  . CARDIAC CATHETERIZATION N/A 07/29/2016   Procedure: Left Heart Cath and Coronary Angiography;  Surgeon: Lorretta Harp, MD;  Location: [place] CV LAB;  Service: Cardiovascular;  Laterality: N/A;  . CARDIAC CATHETERIZATION N/A 07/29/2016   Procedure: Coronary Stent Intervention;  Surgeon: Lorretta Harp, MD;  Location: [place] CV LAB;  Service: Cardiovascular;  Laterality: N/A;  . CARPAL TUNNEL RELEASE Bilateral   . CORONARY ANGIOPLASTY WITH STENT PLACEMENT  07/29/2016  . CORONARY STENT INTERVENTION N/A 05/30/2019   Procedure: CORONARY STENT INTERVENTION;  Surgeon: Belva Crome, MD;  Location: [place] CV LAB;  Service: Cardiovascular;  Laterality: N/A;  . CORONARY STENT  INTERVENTION N/A 11/02/2019   Procedure: CORONARY STENT INTERVENTION;  Surgeon: Wellington Hampshire, MD;  Location: [place] CV LAB;  Service: Cardiovascular;  Laterality: N/A;  . ESOPHAGOGASTRODUODENOSCOPY N/A 09/22/2017   Procedure: ESOPHAGOGASTRODUODENOSCOPY (EGD);  Surgeon: Ladene Artist, MD;  Location: [facility];  Service: Endoscopy;  Laterality: N/A;  . KNEE ARTHROSCOPY Bilateral    "2 on left; 1 on the right"  . LEFT HEART CATH AND CORONARY ANGIOGRAPHY N/A 11/02/2019   Procedure: LEFT HEART CATH AND CORONARY ANGIOGRAPHY;  Surgeon: Wellington Hampshire, MD;  Location: [place] [facility];  Service: Cardiovascular;  Laterality: N/A;  . RIGHT/LEFT HEART CATH AND CORONARY ANGIOGRAPHY N/A 05/30/2019   Procedure: RIGHT/LEFT HEART CATH AND CORONARY ANGIOGRAPHY;  Surgeon: Belva Crome, MD;  Location: [place] [facility] LAB;  Service: Cardiovascular;  Laterality: N/A;  . SHOULDER OPEN ROTATOR CUFF REPAIR Bilateral        Family History  Problem Relation Age of Onset  . Hypertension Brother   . Hypertension Father   . Diabetes Other   . Hyperlipidemia Other     Social History   Tobacco Use  . Smoking status: Current Every Day Smoker    Packs/day: 1.00    Years: 34.00    Pack years: 34.00    Types: Cigarettes  . Smokeless tobacco: Former Systems developer    Types: Snuff, Chew  . Tobacco comment: 8/910/2017 "3 ppd before 2015"  Vaping Use  . Vaping Use: Never used  Substance Use Topics  . Alcohol use: Yes    Comment: RARE  . Drug use: No    Home Medications Prior to Admission medications   Medication Sig Start Date End Date Taking? Authorizing Provider  albuterol (PROAIR HFA) 108 (90 BASE) MCG/ACT inhaler Inhale 2 puffs into the lungs every 6 (six) hours as needed for wheezing or shortness of breath.     [provider]  ALPRAZolam Duanne Moron) 1 MG tablet Take 1-2 mg by mouth 4 (four) times daily as needed (anxiety).     [provider]  atorvastatin (LIPITOR) 80 MG tablet  Take 1 tablet (80 mg total) by mouth daily at 6 PM. 05/31/19   Sande Rives E, PA-C  blood glucose meter kit and supplies KIT Dispense based on patient and insurance preference. Use up to four times daily as directed. (FOR ICD-9 250.00, 250.01). 09/23/17   Hongalgi, Lenis Dickinson, MD  buPROPion (WELLBUTRIN SR) 150 MG 12 hr tablet Take 150 mg by mouth 2 (two) times daily.  08/17/16   [provider]  carvedilol (COREG) 12.5 MG tablet Take 1 tablet (12.5 mg total) by mouth 2 (two) times daily. 11/12/19   Erlene Quan, PA-C  clopidogrel (PLAVIX) 75 MG tablet Take 1 tablet (75 mg total) by mouth daily. Please schedule yearly appointment for more refills, thank you! 1st attempt 05/31/19   Darreld Mclean, PA-C  fenofibrate (TRICOR) 145 MG tablet Take 145 mg by mouth daily.     [provider]  fluconazole (DIFLUCAN) 100 MG tablet Take 100 mg by mouth every Monday.    [provider]  fluticasone (FLONASE) 50 MCG/ACT nasal spray Place 2 sprays into both nostrils daily as needed for allergies. 03/14/19   [provider]  HUMALOG KWIKPEN 100 UNIT/ML SOPN Inject 20-45 Units into the skin 3 (three) times daily with meals. sliding scale 05/02/13   [provider]  hydrochlorothiazide (MICROZIDE) 12.5 MG capsule Take 1 capsule (12.5 mg total) by mouth daily. 04/22/20   Kilroy, Doreene Burke, PA-C  LANTUS SOLOSTAR 100 UNIT/ML Solostar Pen Inject 70 Units into the skin 2 (two) times daily.  12/01/14   [provider]  lisinopril (ZESTRIL) 10 MG tablet Take 1 tablet (10 mg total) by mouth daily. 11/12/19   Erlene Quan, PA-C  meclizine (ANTIVERT) 25 MG tablet Take 25-50 mg by mouth See admin instructions. Take 2 tablets (50 mg) by mouth in the morning & take 1 tablet (25 mg) by mouth at night. 04/10/13   [provider]  metFORMIN (GLUCOPHAGE) 1000 MG tablet Take 1 tablet (1,000 mg total) by mouth 2 (two) times daily with a meal. 11/05/19   Isaiah Serge, NP    nitroGLYCERIN (NITROSTAT) 0.4 MG SL tablet Place 1 tablet (0.4 mg total) under the tongue every 5 (five) minutes as needed for chest pain. 05/01/20   Erlene Quan, PA-C  oxyCODONE (ROXICODONE) 15 MG immediate release tablet Take 15 mg by mouth 5 (five) times daily.     [provider]  pantoprazole (PROTONIX) 40 MG tablet Take 1 tablet (40 mg total) by mouth daily. Patient taking differently: Take 80 mg by mouth daily.  09/23/17   Hongalgi, Lenis Dickinson, MD  pregabalin (LYRICA) 150 MG capsule Take 150 mg by mouth 3 (three) times daily.  05/06/19   [provider]  promethazine (PHENERGAN) 25 MG tablet Take 50 mg by mouth every 6 (six) hours as needed for nausea.     [provider]  Rivaroxaban (XARELTO) 20 MG TABS Take 1 tablet (20 mg total) by mouth daily. Patient taking differently: Take 20 mg by mouth daily. Take with food 05/28/13   Radene Gunning, NP  VICTOZA 18 MG/3ML SOPN Inject 1.2 mg into the skin at bedtime. 04/30/19   [provider]    Allergies    Sulfa antibiotics  Review of Systems   Review of Systems  Cardiovascular: Positive for chest pain.  All other systems reviewed and are negative.   Physical Exam Updated Vital Signs BP (!) 92/61   Pulse 94   Temp 97.8 F (36.6 C) (Oral)   Resp 16   Ht 6' 5"  (1.956 m)   Wt (!) 158.8 kg   SpO2 96%   BMI 41.50 kg/m   Physical Exam Vitals and nursing note reviewed.   Morbidly obese 49 year old male, resting comfortably and in no acute distress. Vital signs are significant for low blood pressure. Oxygen saturation is 96%, which is normal. Head is normocephalic and atraumatic. PERRLA, EOMI. Oropharynx is clear. Neck is nontender and supple without adenopathy or JVD. Back is nontender and there is no CVA tenderness. Lungs are clear without rales, wheezes, or rhonchi. Chest is nontender. Heart has regular rate and rhythm without murmur. Abdomen is soft, flat, nontender without masses or  hepatosplenomegaly and peristalsis is normoactive. Extremities: 1-2+ edema on the right, 2-3+ edema on the left with mild erythema noted of the lower leg.  Calf circumference is 7 cm  greater on the left than on the right.  There are no palpable cords and no tenderness.  Status post amputation right first toe. Skin is warm and dry without other rash. Neurologic: Mental status is normal, cranial nerves are intact, there are no motor or sensory deficits.  ED Results / Procedures / Treatments   Labs (all labs ordered are listed, but only abnormal results are displayed) Labs Reviewed  BASIC METABOLIC PANEL - Abnormal; Notable for the following components:      Result Value   Sodium 134 (*)    Potassium 5.3 (*)    CO2 20 (*)    Glucose, Bld 281 (*)    BUN 59 (*)    Creatinine, Ser 3.53 (*)    Calcium 7.7 (*)    GFR calc non Af Amer 19 (*)    GFR calc Af Amer 23 (*)    All other components within normal limits  BRAIN NATRIURETIC PEPTIDE - Abnormal; Notable for the following components:   B Natriuretic Peptide 199.0 (*)    All other components within normal limits  CBC WITH DIFFERENTIAL/PLATELET - Abnormal; Notable for the following components:   WBC 13.2 (*)    RBC 3.38 (*)    Hemoglobin 10.0 (*)    HCT 31.9 (*)    Neutro Abs 10.2 (*)    Monocytes Absolute 1.1 (*)    Abs Immature Granulocytes 0.12 (*)    All other components within normal limits  TROPONIN I (HIGH SENSITIVITY) - Abnormal; Notable for the following components:   Troponin I (High Sensitivity) 553 (*)    All other components within normal limits  SARS CORONAVIRUS 2 BY RT PCR (HOSPITAL ORDER, [place] [facility])  LACTIC ACID, PLASMA  LACTIC ACID, PLASMA  APTT  HEPARIN LEVEL (UNFRACTIONATED)  APTT  HEPARIN LEVEL (UNFRACTIONATED)  CBC  TROPONIN I (HIGH SENSITIVITY)    EKG EKG Interpretation  Date/Time:  Friday July 18 2020 00:05:08 EDT Ventricular Rate:  96 PR Interval:    QRS  Duration: 100 QT Interval:  319 QTC Calculation: 404 R Axis:   -29 Text Interpretation: Sinus rhythm Inferior infarct, old Anterior infarct, old Baseline wander in lead(s) III When compared with ECG of 11/03/2019, No significant change was found Confirmed by Delora Fuel ([id number]) on 07/18/2020 12:07:56 AM   Radiology DG Chest Port 1 View  Result Date: 07/18/2020 CLINICAL DATA:  Weakness and left-sided chest pain for 1 day EXAM: PORTABLE CHEST 1 VIEW COMPARISON:  11/01/2019 FINDINGS: Cardiac shadow is stable. Lungs are well aerated bilaterally. Mild interstitial changes are noted. No significant edema is noted. No focal infiltrate is seen. No bony abnormality is noted. IMPRESSION: Mild interstitial changes likely related to bronchitis Electronically Signed   By: Inez Catalina M.D.   On: 07/18/2020 01:02    Procedures Procedures  CRITICAL CARE Performed by: Delora Fuel Total critical care time: 50 minutes Critical care time was exclusive of separately billable procedures and treating other patients. Critical care was necessary to treat or prevent imminent or life-threatening deterioration. Critical care was time spent personally by me on the following activities: development of treatment plan with patient and/or surrogate as well as nursing, discussions with consultants, evaluation of patient's response to treatment, examination of patient, obtaining history from patient or surrogate, ordering and performing treatments and interventions, ordering and review of laboratory studies, ordering and review of radiographic studies, pulse oximetry and re-evaluation of patient's condition.  Medications Ordered in ED Medications  heparin ADULT  infusion 100 units/mL (25000 units/236m sodium chloride 0.45%) (1,500 Units/hr Intravenous New Bag/Given 07/18/20 0234)  aspirin chewable tablet 324 mg (324 mg Oral Given 07/18/20 0231)  sodium chloride 0.9 % bolus 1,000 mL (1,000 mLs Intravenous New Bag/Given 07/18/20  0232)    ED Course  I have reviewed the triage vital signs and the nursing notes.  Pertinent labs & imaging results that were available during my care of the patient were reviewed by me and considered in my medical decision making (see chart for details).  MDM Rules/Calculators/A&P Weakness, hypotension.  In the setting of asymmetrical leg swelling, I am certainly concerned about possibility of DVT with pulmonary embolism.  He will need CT angiogram, but will need to check creatinine first since he does have history of renal insufficiency.  ECG shows no acute changes, doubt ACS but will check troponin.  Old records reviewed confirming stent placement last November, no stenosis in nonstented vessels greater than 40%.  Troponin has come back significantly elevated at 553.  Lactic acid level is normal.  Creatinine is markedly elevated at 3.53 compared with baseline of 1.28.  BNP is only mildly elevated at 199 and chest x-ray does not show pulmonary edema.  Hemoglobin has dropped compared with last November, possibly secondary to kidney disease.  Blood pressure has waxed and waned and last reading was slightly over 100.  He is given some IV fluid.  He is already anticoagulated, but request is placed for pharmacy consult to aid on ongoing anticoagulation with heparin.  Case was discussed with Dr. MHassell Doneof cardiology service who request patient be admitted to internal medicine service and they will see in consultation.  Case is discussed with Dr. AJosephine Cablesof [facility] hospitalist, who agrees to admit the patient.  Final Clinical Impression(s) / ED Diagnoses Final diagnoses:  Hypotension, unspecified hypotension type  Acute kidney injury (nontraumatic) (HCC)  Elevated troponin  Normochromic normocytic anemia  Left leg swelling    Rx / DC Orders ED Discharge Orders    None       GDelora Fuel MD [phone]

## 2020-07-18 NOTE — Progress Notes (Signed)
ANTICOAGULATION CONSULT NOTE - Initial Consult  Pharmacy Consult for IV heparin Indication: chest pain/ACS  Allergies  Allergen Reactions  . Sulfa Antibiotics Other (See Comments)    headache    Patient Measurements: Height: 6\' 5"  (195.6 cm) Weight: (!) 158.8 kg (350 lb) IBW/kg (Calculated) : 89.1 Heparin Dosing Weight: 125 kg  Vital Signs: Temp: 97.8 F (36.6 C) (07/29 2356) Temp Source: Oral (07/29 2356) BP: 107/74 (07/30 0200) Pulse Rate: 94 (07/29 2356)  Labs: Recent Labs    07/18/20 0100  HGB 10.0*  HCT 31.9*  PLT 204  CREATININE 3.Joseph*  TROPONINIHS 553*    Estimated Creatinine Clearance: 42.8 mL/min (A) (by C-G formula based on SCr of 3.Joseph mg/dL (H)).   Medical History: Past Medical History:  Diagnosis Date  . Anxiety   . Arthritis    "knees, shoulders, hips, ankles" (07/29/2016)  . Asthma   . CAD (coronary artery disease)    a. 2017: s/p BMS to distal Cx; b. LHC 05/30/2019: 80% mid, distal RCA s/p DES, 30% narrowing of d LM, widely patent LAD w/ luminal irregularities, widely patent stent in dCX  w/ 90+% stenosis distal to stent beofre small trifurcating obtuse marginal (potentially area of restenosis)  . Chronic bronchitis (HCC)   . Chronic ulcer of right great toe (HCC) 09/19/2017  . COPD (chronic obstructive pulmonary disease) (HCC)   . Depression   . DVT (deep venous thrombosis) (HCC)   . GERD (gastroesophageal reflux disease)   . Hyperlipidemia   . Hypertension   . Myocardial infarction (HCC) 1996   "light one"  . PE (pulmonary embolism) 04/2013   On chronic Xarelto  . Peripheral nerve disease   . Pneumonia "several times"  . Type II diabetes mellitus (HCC)     Medications:  Scheduled:  . aspirin  324 mg Oral Once   Infusions:  . sodium chloride      Assessment: [name] presented with CP to start IV heparin for ACS. Baseline labs already drawn - SCr elevated at 3.Joseph. Will need baseline aPTT and heparin level. Note that patient takes  Xarelto for hx DVT/PE. Per RN, patient informed her that the last time he took his Xarelto 20mg  was 7/29 at 1130am.   Goal of Therapy:  Heparin level 0.3-0.7 units/ml aPTT 66-102 seconds Monitor platelets by anticoagulation protocol: Yes   Plan:   Stat aPTT and heparin level   After labs drawn, start IV heparin at rate of 1500 units/hr with NO bolus  Check another aPTT and heparin level 8 hours after heparin started  Daily CBC  07/18/2020,2:05 AM

## 2020-07-18 NOTE — Consult Note (Addendum)
Cardiology Consult    Patient ID: Joseph [name]; [id number]; 1971/08/10   Admit date: 07/17/2020 Date of Consult: 07/18/2020  Primary Care Provider: [name], NP Primary Cardiologist: Skeet Latch, MD   Patient Profile    Joseph [name] is a 49 y.o. male with past medical history of CAD (s/p BMS to LCx in 2017, DES to distal RCA in 05/2019, repeat cath in 10/2019 with DES to distal LCx with patent stents along RCA and mid-LCx), history of DVT/PE (on Xarelto), HTN, HLD and tobacco use who is being seen today for the evaluation of chest pain and elevated troponin values at the request of Dr. Josephine Cables.   History of Present Illness    Joseph [name] was last examined by Kerin Ransom, PA-C in 10/2019 for hospital follow-up from his recent admission for unstable angina during which he underwent DES placement to the distal LCx as outlined below. LDL had been elevated to 149 during admission and BP was elevated at 165/90 at the time of his office visit. Carvedilol was increased to 12.5 mg twice daily and Lisinopril to 10 mg daily. He was referred to the [facility] but did not show-up for his visit.   He presented to [facility] ED last night for evaluation of chest pain which started the day prior to arrival.  He also reported occasional lightheadedness and dizziness.  Reported BP had been as low as 79/50 when checked at home and BP was at 77/64 upon arrival to the ED.  In talking with the patient, he reports having episodes of intermittent chest discomfort over the past several months which he describes as someone wearing cleats stepping on his chest followed by someone pouring gas on his chest. His pain can occur at rest or with activity. Can last for minutes to hours at a time. He has tried utilizing nitroglycerin in the past with minimal improvement. He reports having baseline dyspnea on exertion and is unable to walk more than 20 feet without stopping. Denies any specific orthopnea or  PND. Over the past several days, he has developed worsening swelling and erythema along his left leg.  He reports good compliance with his current medication regimen and denies missing any recent doses.  Initial labs show WBC 13.2, Hgb 10.0, platelets 204, Na+ 134, K+ 5.3 and creatinine 3.53 (previously 1.28 in 10/2019). Mg 1.4. BNP 199. Lactic Acid 1.5. COVID negative. Initial HS Troponin 553 with repeat of 528.  CXR showed mild interstitial changes likely related to bronchitis. EKG shows NSR, HR 96 with inferior infarct pattern.    Past Medical History:  Diagnosis Date  . Anxiety   . Arthritis    "knees, shoulders, hips, ankles" (07/29/2016)  . Asthma   . CAD (coronary artery disease)    a. 2017: s/p BMS to distal Cx; b. LHC 05/30/2019: 80% mid, distal RCA s/p DES, 30% narrowing of d LM, widely patent LAD w/ luminal irregularities, widely patent stent in [place]  w/ 90+% stenosis distal to stent beofre small trifurcating obtuse marginal (potentially area of restenosis)  . Chronic bronchitis ([place])   . Chronic ulcer of right great toe ([place]) 09/19/2017  . COPD (chronic obstructive pulmonary disease) ([place])   . Depression   . DVT (deep venous thrombosis) ([place])   . GERD (gastroesophageal reflux disease)   . Hyperlipidemia   . Hypertension   . Myocardial infarction ([place]) 1996   "light one"  . PE (pulmonary embolism) 04/2013   On chronic Xarelto  .  Peripheral nerve disease   . Pneumonia "several times"  . Type II diabetes mellitus ([place])     Past Surgical History:  Procedure Laterality Date  . AMPUTATION Right 09/21/2017   Procedure: RIGHT GREAT TOE AMPUTATION, POSSIBLE VAC;  Surgeon: Leandrew Koyanagi, MD;  Location: [place];  Service: Orthopedics;  Laterality: Right;  . CARDIAC CATHETERIZATION  2006  . CARDIAC CATHETERIZATION  1996   "@ [facility]; when I had my heart attack"  . CARDIAC CATHETERIZATION N/A 07/29/2016   Procedure: Left Heart Cath and Coronary Angiography;  Surgeon: Lorretta Harp, MD;   Location: [place] CV [facility];  Service: Cardiovascular;  Laterality: N/A;  . CARDIAC CATHETERIZATION N/A 07/29/2016   Procedure: Coronary Stent Intervention;  Surgeon: Lorretta Harp, MD;  Location: [place] [facility];  Service: Cardiovascular;  Laterality: N/A;  . CARPAL TUNNEL RELEASE Bilateral   . CORONARY ANGIOPLASTY WITH STENT PLACEMENT  07/29/2016  . CORONARY STENT INTERVENTION N/A 05/30/2019   Procedure: CORONARY STENT INTERVENTION;  Surgeon: Belva Crome, MD;  Location: [place] [facility];  Service: Cardiovascular;  Laterality: N/A;  . CORONARY STENT INTERVENTION N/A 11/02/2019   Procedure: CORONARY STENT INTERVENTION;  Surgeon: Wellington Hampshire, MD;  Location: [place] [facility];  Service: Cardiovascular;  Laterality: N/A;  . ESOPHAGOGASTRODUODENOSCOPY N/A 09/22/2017   Procedure: ESOPHAGOGASTRODUODENOSCOPY (EGD);  Surgeon: Ladene Artist, MD;  Location: [facility] ENDOSCOPY;  Service: Endoscopy;  Laterality: N/A;  . KNEE ARTHROSCOPY Bilateral    "2 on left; 1 on the right"  . LEFT HEART CATH AND CORONARY ANGIOGRAPHY N/A 11/02/2019   Procedure: LEFT HEART CATH AND CORONARY ANGIOGRAPHY;  Surgeon: Wellington Hampshire, MD;  Location: [place] [facility] LAB;  Service: Cardiovascular;  Laterality: N/A;  . RIGHT/LEFT HEART CATH AND CORONARY ANGIOGRAPHY N/A 05/30/2019   Procedure: RIGHT/LEFT HEART CATH AND CORONARY ANGIOGRAPHY;  Surgeon: Belva Crome, MD;  Location: [place] [facility] LAB;  Service: Cardiovascular;  Laterality: N/A;  . SHOULDER OPEN ROTATOR CUFF REPAIR Bilateral      Home Medications:  Prior to Admission medications   Medication Sig Start Date End Date Taking? Authorizing Provider  albuterol (PROAIR HFA) 108 (90 BASE) MCG/ACT inhaler Inhale 2 puffs into the lungs every 6 (six) hours as needed for wheezing or shortness of breath.     [provider]  ALPRAZolam Duanne Moron) 1 MG tablet Take 1-2 mg by mouth 4 (four) times daily as needed (anxiety).     [provider]    atorvastatin (LIPITOR) 80 MG tablet Take 1 tablet (80 mg total) by mouth daily at 6 PM. 05/31/19   Sande Rives E, PA-C  blood glucose meter kit and supplies KIT Dispense based on patient and insurance preference. Use up to four times daily as directed. (FOR ICD-9 250.00, 250.01). 09/23/17   Hongalgi, Lenis Dickinson, MD  buPROPion (WELLBUTRIN SR) 150 MG 12 hr tablet Take 150 mg by mouth 2 (two) times daily.  08/17/16   [provider]  carvedilol (COREG) 12.5 MG tablet Take 1 tablet (12.5 mg total) by mouth 2 (two) times daily. 11/12/19   Erlene Quan, PA-C  clopidogrel (PLAVIX) 75 MG tablet Take 1 tablet (75 mg total) by mouth daily. Please schedule yearly appointment for more refills, thank you! 1st attempt 05/31/19   Darreld Mclean, PA-C  fenofibrate (TRICOR) 145 MG tablet Take 145 mg by mouth daily.     [provider]  fluconazole (DIFLUCAN) 100 MG tablet Take 100 mg by mouth  every Monday.    [provider]  fluticasone (FLONASE) 50 MCG/ACT nasal spray Place 2 sprays into both nostrils daily as needed for allergies. 03/14/19   [provider]  HUMALOG KWIKPEN 100 UNIT/ML SOPN Inject 20-45 Units into the skin 3 (three) times daily with meals. sliding scale 05/02/13   [provider]  hydrochlorothiazide (MICROZIDE) 12.5 MG capsule Take 1 capsule (12.5 mg total) by mouth daily. 04/22/20   Kilroy, Doreene Burke, PA-C  LANTUS SOLOSTAR 100 UNIT/ML Solostar Pen Inject 70 Units into the skin 2 (two) times daily.  12/01/14   [provider]  lisinopril (ZESTRIL) 10 MG tablet Take 1 tablet (10 mg total) by mouth daily. 11/12/19   Erlene Quan, PA-C  meclizine (ANTIVERT) 25 MG tablet Take 25-50 mg by mouth See admin instructions. Take 2 tablets (50 mg) by mouth in the morning & take 1 tablet (25 mg) by mouth at night. 04/10/13   [provider]  metFORMIN (GLUCOPHAGE) 1000 MG tablet Take 1 tablet (1,000 mg total) by mouth 2 (two) times daily with a meal.  11/05/19   Isaiah Serge, NP  nitroGLYCERIN (NITROSTAT) 0.4 MG SL tablet Place 1 tablet (0.4 mg total) under the tongue every 5 (five) minutes as needed for chest pain. 05/01/20   Erlene Quan, PA-C  oxyCODONE (ROXICODONE) 15 MG immediate release tablet Take 15 mg by mouth 5 (five) times daily.     [provider]  pantoprazole (PROTONIX) 40 MG tablet Take 1 tablet (40 mg total) by mouth daily. Patient taking differently: Take 80 mg by mouth daily.  09/23/17   Hongalgi, Lenis Dickinson, MD  pregabalin (LYRICA) 150 MG capsule Take 150 mg by mouth 3 (three) times daily.  05/06/19   [provider]  promethazine (PHENERGAN) 25 MG tablet Take 50 mg by mouth every 6 (six) hours as needed for nausea.     [provider]  Rivaroxaban (XARELTO) 20 MG TABS Take 1 tablet (20 mg total) by mouth daily. Patient taking differently: Take 20 mg by mouth daily. Take with food 05/28/13   Radene Gunning, NP  VICTOZA 18 MG/3ML SOPN Inject 1.2 mg into the skin at bedtime. 04/30/19   [provider]    Inpatient Medications: Scheduled Meds: . atorvastatin  80 mg Oral q1800  . clopidogrel  75 mg Oral Daily  . insulin aspart  0-15 Units Subcutaneous TID WC  . insulin aspart  0-5 Units Subcutaneous QHS  . pantoprazole  40 mg Oral Daily   Continuous Infusions: . heparin 1,500 Units/hr (07/18/20 0234)   PRN Meds:   Allergies:    Allergies  Allergen Reactions  . Sulfa Antibiotics Other (See Comments)    headache    Social History:   Social History   Socioeconomic History  . Marital status: Married    Spouse name: Not on file  . Number of children: Not on file  . Years of education: Not on file  . Highest education level: Not on file  Occupational History  . Occupation: disabled     Fish farm manager: UNEMPLOYED  Tobacco Use  . Smoking status: Current Every Day Smoker    Packs/day: 1.00    Years: 34.00    Pack years: 34.00    Types: Cigarettes  . Smokeless tobacco: Former Systems developer      Types: Snuff, Chew  . Tobacco comment: 8/910/2017 "3 ppd before 2015"  Vaping Use  . Vaping Use: Never used  Substance and Sexual Activity  .  Alcohol use: Yes    Comment: RARE  . Drug use: No  . Sexual activity: Yes  Other Topics Concern  . Not on file  Social History Narrative  . Not on file   Social Determinants of Health   Financial Resource Strain:   . Difficulty of Paying Living Expenses:   Food Insecurity:   . Worried About Charity fundraiser in the Last Year:   . Arboriculturist in the Last Year:   Transportation Needs:   . Film/video editor (Medical):   Marland Kitchen Lack of Transportation (Non-Medical):   Physical Activity:   . Days of Exercise per Week:   . Minutes of Exercise per Session:   Stress:   . Feeling of Stress :   Social Connections:   . Frequency of Communication with Friends and Family:   . Frequency of Social Gatherings with Friends and Family:   . Attends Religious Services:   . Active Member of Clubs or Organizations:   . Attends Archivist Meetings:   Marland Kitchen Marital Status:   Intimate Partner Violence:   . Fear of Current or Ex-Partner:   . Emotionally Abused:   Marland Kitchen Physically Abused:   . Sexually Abused:      Family History:    Family History  Problem Relation Age of Onset  . Hypertension Brother   . Hypertension Father   . Diabetes Other   . Hyperlipidemia Other       Review of Systems    General:  No chills, fever, night sweats or weight changes. Positive for dizziness and fatigue.  Cardiovascular:  No orthopnea, palpitations, paroxysmal nocturnal dyspnea. Positive for chest pain, dyspnea on exertion and edema.  Dermatological: No rash, lesions/masses Respiratory: Positive for productive cough and dyspnea. Urologic: No hematuria, dysuria Abdominal:   No nausea, vomiting, diarrhea, bright red blood per rectum, melena, or hematemesis Neurologic:  No visual changes, wkns, changes in mental status. All other systems reviewed  and are otherwise negative except as noted above.  Physical Exam/Data    Vitals:   07/18/20 0600 07/18/20 0630 07/18/20 0800 07/18/20 0830  BP: 95/66 97/70 94/69  93/68  Pulse: 85 82 81 83  Resp: 20 20 18 15   Temp:      TempSrc:      SpO2: 93% 92% 93% 94%  Weight:      Height:       No intake or output data in the 24 hours ending 07/18/20 0936 Filed Weights   07/17/20 2355  Weight: (!) 158.8 kg   Body mass index is 41.5 kg/m.   General: Pleasant obese male appearing in NAD Psych: Normal affect. Neuro: Alert and oriented X 3. Moves all extremities spontaneously. HEENT: Normal  Neck: Supple without bruits or JVD. Lungs:  Resp regular and unlabored, scattered rhonchi. Heart: RRR no s3, s4, or murmurs. Abdomen: Soft, non-tender, non-distended, BS + x 4.  Extremities: No clubbing or cyanosis. 1+ pitting edema bilaterally, more pronounced along LLE with associated erythema. DP/PT/Radials 2+ and equal bilaterally.   EKG:  The EKG was personally reviewed and demonstrates: NSR, HR 96 with inferior infarct pattern.   Telemetry:  Telemetry was personally reviewed and demonstrates: NSR, HR in 80's to 90's.    Labs/Studies     Relevant CV Studies:  Echocardiogram: 10/2019 IMPRESSIONS    1. Left ventricular ejection fraction, by visual estimation, is 55 to  60%. The left ventricle has normal function. There is mildly increased  left ventricular  hypertrophy.  2. Mid inferolateral segment and apical lateral segment are abnormal.  This is consistent with ischemia/injury in the territory of a  posterolateral ventricular artery.  3. Definity contrast agent was given IV to delineate the left ventricular  endocardial borders.  4. Elevated left atrial pressure.  5. Left ventricular diastolic parameters are consistent with Grade II  diastolic dysfunction (pseudonormalization).  6. Global right ventricle has normal systolic function.The right  ventricular size is normal.  Right vetricular wall thickness was not  assessed.  7. Left atrial size was mildly dilated.  8. Right atrial size was not well visualized.  9. The mitral valve is normal in structure. No evidence of mitral valve  regurgitation.  10. The tricuspid valve is not well visualized. Tricuspid valve  regurgitation is not demonstrated.  11. The aortic valve is tricuspid. Aortic valve regurgitation is not  visualized. Mild aortic valve sclerosis without stenosis.  12. The pulmonic valve was not well visualized. Pulmonic valve  regurgitation is not visualized.  13. TR signal is inadequate for assessing pulmonary artery systolic  pressure.  14. The inferior vena cava is dilated in size with <50% respiratory  variability, suggesting right atrial pressure of 15 mmHg.  15. The interatrial septum was not assessed.    Cardiac Catheterization: 10/2019  Mid LM to Dist LM lesion is 35% stenosed.  Ost 1st Diag to 1st Diag lesion is 15% stenosed.  Mid Cx to Dist Cx lesion is 5% stenosed.  Dist Cx lesion is 90% stenosed.  Prox RCA lesion is 25% stenosed.  Previously placed Mid RCA stent (unknown type) is widely patent.  Ost RPDA to RPDA lesion is 30% stenosed.  Ost Cx to Prox Cx lesion is 40% stenosed.  Post intervention, there is a 0% residual stenosis.  A drug-eluting stent was successfully placed using a STENT RESOLUTE ONYX 2.5X18.   1.  Significant underlying three-vessel coronary artery disease with widely patent stents in the left circumflex and right coronary artery.  There is a 90% stenosis in the distal left circumflex which was present on most recent cardiac catheterization. 2.  The aortic valve was not crossed due to significant tortuosity in the innominate artery.  EF was normal by echo. 3.  Successful difficult balloon angioplasty and drug-eluting stent placement to the distal left circumflex.  Recommendations: The patient is already on clopidogrel.  Anticoagulation with  Xarelto can be resumed tomorrow if no bleeding complications.  He can be treated without aspirin in order to minimize bleeding risk.  The procedure was very difficult due to the patient's weight and difficult visualization with fluoroscopy, tortuosity of the innominate artery which made engaging the left main very difficult with poor guide support as well as tortuosity of the left circumflex coming from the left main.  Ultimately, I used an AL to guide with the guide liner for support in order to deliver a stent.    Laboratory Data:  Chemistry Recent Labs  Lab 07/18/20 0100  NA 134*  K 5.3*  CL 107  CO2 20*  GLUCOSE 281*  BUN 59*  CREATININE 3.53*  CALCIUM 7.7*  GFRNONAA 19*  GFRAA 23*  ANIONGAP 7    No results for input(s): PROT, ALBUMIN, AST, ALT, ALKPHOS, BILITOT in the last 168 hours. Hematology Recent Labs  Lab 07/18/20 0100 07/18/20 0243  WBC 13.2* 13.8*  RBC 3.38* 3.47*  HGB 10.0* 10.4*  HCT 31.9* 33.8*  MCV 94.4 97.4  MCH 29.6 30.0  MCHC 31.3 30.8  RDW  14.0 14.0  PLT 204 216   Cardiac EnzymesNo results for input(s): TROPONINI in the last 168 hours. No results for input(s): TROPIPOC in the last 168 hours.  BNP Recent Labs  Lab 07/18/20 0100  BNP 199.0*    DDimer No results for input(s): DDIMER in the last 168 hours.  Radiology/Studies:  DG Chest Port 1 View  Result Date: 07/18/2020 CLINICAL DATA:  Weakness and left-sided chest pain for 1 day EXAM: PORTABLE CHEST 1 VIEW COMPARISON:  11/01/2019 FINDINGS: Cardiac shadow is stable. Lungs are well aerated bilaterally. Mild interstitial changes are noted. No significant edema is noted. No focal infiltrate is seen. No bony abnormality is noted. IMPRESSION: Mild interstitial changes likely related to bronchitis Electronically Signed   By: Inez Catalina M.D.   On: 07/18/2020 01:02     Assessment & Plan    1. Chest Pain with Atypical Features/Elevated Troponin - The chest pain he describes has atypical  qualities and has been occurring for several months. Feels like a shooting pain and then someone pouring gas on his chest. Can occur at rest or with activity. He denies any pain at this current time.  - Initial HS Troponin was elevated to 553 with repeat of 528. EKG shows NSR, HR 96 with inferior infarct pattern. An echocardiogram is pending to assess for any structural abnormalities. At this time, he would not be a candidate for a repeat catheterization given his AKI and the high-risk for contrast induced nephropathy. Would continue to treat his underlying hypotension and AKI. If he continues to have pain throughout admission, could consider repeat ischemic evaluation once his other acute issues have improved.  - Continue Heparin, Plavix and statin therapy. No BB at this time given hypotension.  2. CAD - He is s/p BMS to LCx in 2017, DES to distal RCA in 05/2019 and repeat cath in 10/2019 with DES to distal LCx with patent stents along RCA and mid-LCx. - Will obtain an echocardiogram to assess LV function and wall motion. Not a candidate for a repeat cath at this time given his AKI.  - Continue Atorvastatin 50m daily and Plavix 770mdaily. Remains on Heparin for anticoagulation as Xarelto is held in case invasive procedures are indicated this admission. Not on ASA prior to admission given the need for anticoagulation.   3. History of DVT/PE - He does have a history of DVT/PE and was on Xarelto prior to admission. Denies missing any recent doses. VQ Scan and lower extremity dopplers have been ordered by the admitting team. Significant erythema concerning for possible cellulitis. Remains on Heparin for anticoagulation.   4. Hypotension - Unclear etiology. He did not meet sepsis criteria on admission but WBC is elevated to 13.8 and he does have significant erythema along his left leg which is concerning for cellulitis. Also anemic with Hgb at 10.4. He has responded well to IVF and remains on IVF at 75  mL/hr. SBP in the 70's upon arrival and improved to the 90's on most recent check. PTA Coreg 12.53m77mID and Lisinopril 54m66mily currently held.   5. HLD - LDL was elevated to 149 in 10/2019 and Atorvastatin was titrated to 80mg59mly. Will recheck FLP with AM labs.   6. AKI - Creatinine was elevated to 3.53 on admission (previously 1.28 in 10/2019). Receiving IVF. Holding Lisinopril and Metformin. Further management per admitting team.     For questions or updates, please contact [facility] consult [email or web address] for contact info under Cardiology/STEMI.  Arna Medici, PA-C 07/18/2020, 9:36 AM Pager: [phone]   Attending note:  Patient seen and examined. I reviewed his records and discussed the case with Ms. Ahmed Prima PA-C. Joseph [name] is a patient of Dr. Oval Linsey with history of CAD status post BMS to the mid circumflex in 2017, DES to the RCA in June 2020, and DES to the distal circumflex in November 2020. He presents to the ER with recent complaints of chest discomfort, also lightheadedness and found to be hypotensive. At baseline he has dyspnea on exertion which is unchanged recently, the episodes of chest pain have not necessarily been predictable or associated with exertion. He has had left leg swelling, but denies fevers or chills, no obvious bleeding problems.  Initial systolic blood pressure 77 when first assessed in the ER, improved with IV fluids. On our examination blood pressure high 90s to low 100 range, heart rate in the 80s in sinus rhythm on telemetry. Lungs with few scattered rhonchi, no wheezing. Cardiac exam with RRR and no gallop, mild leg edema noted, worse on the left with associated erythema.  Pertinent lab work includes potassium 5.3, BUN 59, creatinine 3.53, BNP 199, high-sensitivity troponin I levels of 553 and 528, lactic acid 1.5, WBC 13.8, hemoglobin 10.4, platelets 216.  ECG shows probable sinus rhythm with indistinct P waves,  leftward axis rule out old inferior infarct pattern, poor R wave progression rule out old anterior infarct pattern. Fairly similar to previous tracing in November 2020.  Chest x-ray reports mild interstitial changes, no definite edema or infiltrates.  Patient presents with recent recurring chest pain, not definitive typical anginal pattern however, also with clinical picture of hypotension with etiology work-up pending, possibly left leg cellulitis, also acute renal failure. High-sensitivity troponin I levels in flat pattern is most consistent with demand ischemia, ECG abnormalities are very similar to previous tracing from November 2020. He has a known history of CAD with PCIs as discussed above. He also have a history of pulmonary embolus however has been on Xarelto as an outpatient. Echocardiogram has been ordered. Agree with stopping lisinopril and Metformin at this time. Coreg is also on hold given relative hypotension. He is being evaluated by primary team with VQ scan and lower extremity venous Dopplers to exclude recurrent thromboembolism, although this seems less likely since he has been anticoagulated. Consider antibiotic course for suspected left leg cellulitis per primary team. May need to ultimately consider follow-up noninvasive ischemic assessment, but would clarify full clinical picture first and get him back to baseline on stable medical regimen prior to considering this.  Satira Sark, M.D., F.A.C.C.

## 2020-07-18 NOTE — ED Notes (Signed)
Lab at bedside at this time getting patient's blood cultures. Will start antibiotics after those are drawn.

## 2020-07-18 NOTE — ED Notes (Signed)
Date and time results received: 07/18/20 0202  Test: Troponin Critical Value: 553  Name of Provider Notified: Preston Fleeting, MD  Orders Received? Or Actions Taken?: acknowledged and orders placed.

## 2020-07-18 NOTE — ED Notes (Signed)
Echo complete at this time ° °

## 2020-07-18 NOTE — H&P (Signed)
History and Physical  Drayke Grabel Giel ZGY:[id number] DOB: [birth year] DOA: 07/17/2020  Referring physician: Delora Fuel MD PCP: [name], NP  Patient coming from: Home  Chief Complaint: Low blood pressure and fatigue  HPI: Joseph [name] is a 49 y.o. male with medical history significant for CAD s/p DES x1 to RCA (6/20), HTN, DM, HLD, PE (on Xarelto), DVT, COPD, depression and tobacco use who presented to the emergency department due to 1 day onset of weakness and lightheaded. He states that his BP at home has been lower lately but yesterday he noted his BP to be as low as 79/50, he also complained of increased shortness of breath on exertion with difficulty in taking 15-20 steps without needing to rest (patient feels that arthritis could also be a contributing factor to this). He complained of headache and some tightness to his chest, he has a history of OSA and was on CPAP until 2.5 years ago when he got damage and he has not followed up on replacing this, patient was not sure if this is related to his current symptoms. He also complained of recent left leg swelling over the last week. He denies diaphoresis, palpitations, nausea, vomiting or abdominal pain.   ED Course:  In the emergency department, BP was 77/64 on arrival. Work-up in the ED showed normocytic anemia, leukocytosis, BNP was around 99, troponin  553> 528, SARS coronavirus 2 was negative.  BUN/creatinine was 59/3.53 (baseline creatinine was 1.2-1.3).  Chest x-ray showed mild interstitial changes likely related to bronchitis. He was treated with aspirin 324 Mg x1, IV NS 1 L was given and patient was started on IV heparin drip. Hospitalist was asked to admit patient for further evaluation and management.   Review of Systems: Constitutional: Negative for chills and fever.  HENT: Negative for ear pain and sore throat.   Eyes: Negative for pain and visual disturbance.  Respiratory: Negative for cough, chest tightness and  shortness of breath.   Cardiovascular: Positive for chest pain. Negative for palpitations.  Gastrointestinal: Negative for abdominal pain and vomiting.  Endocrine: Negative for polyphagia and polyuria.  Genitourinary: Negative for decreased urine volume, dysuria Musculoskeletal: Negative for arthralgias and back pain.  Skin: Negative for color change and rash.  Allergic/Immunologic: Negative for immunocompromised state.  Neurological: Negative for tremors, syncope, speech difficulty, weakness, light-headedness and headaches.  Hematological: Does not bruise/bleed easily.  All other systems reviewed and are negative   Past Medical History:  Diagnosis Date  . Anxiety   . Arthritis    "knees, shoulders, hips, ankles" (07/29/2016)  . Asthma   . CAD (coronary artery disease)    a. 2017: s/p BMS to distal Cx; b. LHC 05/30/2019: 80% mid, distal RCA s/p DES, 30% narrowing of d LM, widely patent LAD w/ luminal irregularities, widely patent stent in [place]  w/ 90+% stenosis distal to stent beofre small trifurcating obtuse marginal (potentially area of restenosis)  . Chronic bronchitis ([place])   . Chronic ulcer of right great toe ([place]) 09/19/2017  . COPD (chronic obstructive pulmonary disease) ([place])   . Depression   . DVT (deep venous thrombosis) ([place])   . GERD (gastroesophageal reflux disease)   . Hyperlipidemia   . Hypertension   . Myocardial infarction ([place]) 1996   "light one"  . PE (pulmonary embolism) 04/2013   On chronic Xarelto  . Peripheral nerve disease   . Pneumonia "several times"  . Type II diabetes mellitus ([place])    Past Surgical History:  Procedure  Laterality Date  . AMPUTATION Right 09/21/2017   Procedure: RIGHT GREAT TOE AMPUTATION, POSSIBLE VAC;  Surgeon: Leandrew Koyanagi, MD;  Location: [place];  Service: Orthopedics;  Laterality: Right;  . CARDIAC CATHETERIZATION  2006  . CARDIAC CATHETERIZATION  1996   "@ [facility]; when I had my heart attack"  . CARDIAC CATHETERIZATION N/A  07/29/2016   Procedure: Left Heart Cath and Coronary Angiography;  Surgeon: Lorretta Harp, MD;  Location: [place] [facility];  Service: Cardiovascular;  Laterality: N/A;  . CARDIAC CATHETERIZATION N/A 07/29/2016   Procedure: Coronary Stent Intervention;  Surgeon: Lorretta Harp, MD;  Location: [place] [facility];  Service: Cardiovascular;  Laterality: N/A;  . CARPAL TUNNEL RELEASE Bilateral   . CORONARY ANGIOPLASTY WITH STENT PLACEMENT  07/29/2016  . CORONARY STENT INTERVENTION N/A 05/30/2019   Procedure: CORONARY STENT INTERVENTION;  Surgeon: Belva Crome, MD;  Location: [place] [facility];  Service: Cardiovascular;  Laterality: N/A;  . CORONARY STENT INTERVENTION N/A 11/02/2019   Procedure: CORONARY STENT INTERVENTION;  Surgeon: Wellington Hampshire, MD;  Location: [place] [facility];  Service: Cardiovascular;  Laterality: N/A;  . ESOPHAGOGASTRODUODENOSCOPY N/A 09/22/2017   Procedure: ESOPHAGOGASTRODUODENOSCOPY (EGD);  Surgeon: Ladene Artist, MD;  Location: [facility];  Service: Endoscopy;  Laterality: N/A;  . KNEE ARTHROSCOPY Bilateral    "2 on left; 1 on the right"  . LEFT HEART CATH AND CORONARY ANGIOGRAPHY N/A 11/02/2019   Procedure: LEFT HEART CATH AND CORONARY ANGIOGRAPHY;  Surgeon: Wellington Hampshire, MD;  Location: [place] [facility];  Service: Cardiovascular;  Laterality: N/A;  . RIGHT/LEFT HEART CATH AND CORONARY ANGIOGRAPHY N/A 05/30/2019   Procedure: RIGHT/LEFT HEART CATH AND CORONARY ANGIOGRAPHY;  Surgeon: Belva Crome, MD;  Location: [place] [facility];  Service: Cardiovascular;  Laterality: N/A;  . SHOULDER OPEN ROTATOR CUFF REPAIR Bilateral     Social History:  reports that he has been smoking cigarettes. He has a 34.00 pack-year smoking history. He has quit using smokeless tobacco.  His smokeless tobacco use included snuff and chew. He reports current alcohol use. He reports that he does not use drugs.   Allergies  Allergen Reactions  . Sulfa Antibiotics Other (See  Comments)    headache    Family History  Problem Relation Age of Onset  . Hypertension Brother   . Hypertension Father   . Diabetes Other   . Hyperlipidemia Other      Prior to Admission medications   Medication Sig Start Date End Date Taking? Authorizing Provider  albuterol (PROAIR HFA) 108 (90 BASE) MCG/ACT inhaler Inhale 2 puffs into the lungs every 6 (six) hours as needed for wheezing or shortness of breath.     [provider]  ALPRAZolam Duanne Moron) 1 MG tablet Take 1-2 mg by mouth 4 (four) times daily as needed (anxiety).     [provider]  atorvastatin (LIPITOR) 80 MG tablet Take 1 tablet (80 mg total) by mouth daily at 6 PM. 05/31/19   Sande Rives E, PA-C  blood glucose meter kit and supplies KIT Dispense based on patient and insurance preference. Use up to four times daily as directed. (FOR ICD-9 250.00, 250.01). 09/23/17   Hongalgi, Lenis Dickinson, MD  buPROPion (WELLBUTRIN SR) 150 MG 12 hr tablet Take 150 mg by mouth 2 (two) times daily.  08/17/16   [provider]  carvedilol (COREG) 12.5 MG tablet Take 1 tablet (12.5 mg total) by mouth 2 (two) times daily. 11/12/19  Erlene Quan, PA-C  clopidogrel (PLAVIX) 75 MG tablet Take 1 tablet (75 mg total) by mouth daily. Please schedule yearly appointment for more refills, thank you! 1st attempt 05/31/19   Darreld Mclean, PA-C  fenofibrate (TRICOR) 145 MG tablet Take 145 mg by mouth daily.     [provider]  fluconazole (DIFLUCAN) 100 MG tablet Take 100 mg by mouth every Monday.    [provider]  fluticasone (FLONASE) 50 MCG/ACT nasal spray Place 2 sprays into both nostrils daily as needed for allergies. 03/14/19   [provider]  HUMALOG KWIKPEN 100 UNIT/ML SOPN Inject 20-45 Units into the skin 3 (three) times daily with meals. sliding scale 05/02/13   [provider]  hydrochlorothiazide (MICROZIDE) 12.5 MG capsule Take 1 capsule (12.5 mg total) by mouth daily. 04/22/20    Kilroy, Doreene Burke, PA-C  LANTUS SOLOSTAR 100 UNIT/ML Solostar Pen Inject 70 Units into the skin 2 (two) times daily.  12/01/14   [provider]  lisinopril (ZESTRIL) 10 MG tablet Take 1 tablet (10 mg total) by mouth daily. 11/12/19   Erlene Quan, PA-C  meclizine (ANTIVERT) 25 MG tablet Take 25-50 mg by mouth See admin instructions. Take 2 tablets (50 mg) by mouth in the morning & take 1 tablet (25 mg) by mouth at night. 04/10/13   [provider]  metFORMIN (GLUCOPHAGE) 1000 MG tablet Take 1 tablet (1,000 mg total) by mouth 2 (two) times daily with a meal. 11/05/19   Isaiah Serge, NP  nitroGLYCERIN (NITROSTAT) 0.4 MG SL tablet Place 1 tablet (0.4 mg total) under the tongue every 5 (five) minutes as needed for chest pain. 05/01/20   Erlene Quan, PA-C  oxyCODONE (ROXICODONE) 15 MG immediate release tablet Take 15 mg by mouth 5 (five) times daily.     [provider]  pantoprazole (PROTONIX) 40 MG tablet Take 1 tablet (40 mg total) by mouth daily. Patient taking differently: Take 80 mg by mouth daily.  09/23/17   Hongalgi, Lenis Dickinson, MD  pregabalin (LYRICA) 150 MG capsule Take 150 mg by mouth 3 (three) times daily.  05/06/19   [provider]  promethazine (PHENERGAN) 25 MG tablet Take 50 mg by mouth every 6 (six) hours as needed for nausea.     [provider]  Rivaroxaban (XARELTO) 20 MG TABS Take 1 tablet (20 mg total) by mouth daily. Patient taking differently: Take 20 mg by mouth daily. Take with food 05/28/13   Radene Gunning, NP  VICTOZA 18 MG/3ML SOPN Inject 1.2 mg into the skin at bedtime. 04/30/19   [provider]    Physical Exam: BP (!) 86/62   Pulse 87   Temp 97.8 F (36.6 C) (Oral)   Resp 15   Ht 6' 5"  (1.956 m)   Wt (!) 158.8 kg   SpO2 91%   BMI 41.50 kg/m   . General: 49 y.o. year-old male well developed well nourished in no acute distress.  Alert and oriented x3.  Marland Kitchen HEENT: Normocephalic, atraumatic, EOMI . Neck: Supple,  trachea medial . Cardiovascular: Regular rate and rhythm with no rubs or gallops.  No thyromegaly or JVD noted.  No lower extremity edema. 2/4 pulses in all 4 extremities. Marland Kitchen Respiratory: Diffuse rhonchi on auscultation. No wheezes  . Abdomen: Soft nontender nondistended with normal bowel sounds x4 quadrants. . Muskuloskeletal: No cyanosis, clubbing or edema noted bilaterally . Neuro: CN II-XII intact, strength, sensation, reflexes . Skin: No ulcerative lesions  noted or rashes . Psychiatry: Judgement and insight appear normal. Mood is appropriate for condition and setting          Labs on Admission:  Basic Metabolic Panel: Recent Labs  Lab 07/18/20 0100  NA 134*  K 5.3*  CL 107  CO2 20*  GLUCOSE 281*  BUN 59*  CREATININE 3.53*  CALCIUM 7.7*   Liver Function Tests: No results for input(s): AST, ALT, ALKPHOS, BILITOT, PROT, ALBUMIN in the last 168 hours. No results for input(s): LIPASE, AMYLASE in the last 168 hours. No results for input(s): AMMONIA in the last 168 hours. CBC: Recent Labs  Lab 07/18/20 0100 07/18/20 0243  WBC 13.2* 13.8*  NEUTROABS 10.2*  --   HGB 10.0* 10.4*  HCT 31.9* 33.8*  MCV 94.4 97.4  PLT 204 216   Cardiac Enzymes: No results for input(s): CKTOTAL, CKMB, CKMBINDEX, TROPONINI in the last 168 hours.  BNP (last 3 results) Recent Labs    11/01/19 1556 07/18/20 0100  BNP 43.3 199.0*    ProBNP (last 3 results) No results for input(s): PROBNP in the last 8760 hours.  CBG: No results for input(s): GLUCAP in the last 168 hours.  Radiological Exams on Admission: DG Chest Port 1 View  Result Date: 07/18/2020 CLINICAL DATA:  Weakness and left-sided chest pain for 1 day EXAM: PORTABLE CHEST 1 VIEW COMPARISON:  11/01/2019 FINDINGS: Cardiac shadow is stable. Lungs are well aerated bilaterally. Mild interstitial changes are noted. No significant edema is noted. No focal infiltrate is seen. No bony abnormality is noted. IMPRESSION: Mild interstitial  changes likely related to bronchitis Electronically Signed   By: Inez Catalina M.D.   On: 07/18/2020 01:02    EKG: I independently viewed the EKG done and my findings are as followed: Sinus rhythm at rate of 96 bpm  Assessment/Plan Present on Admission: . Hypotension . History of pulmonary embolus (PE) . Dyslipidemia . OSA (obstructive sleep apnea) . Hyperglycemia . Leukocytosis  Principal Problem:   Hypotension Active Problems:   Dyslipidemia   CAD -S/P PCI 11/02/19    History of pulmonary embolus (PE)   OSA (obstructive sleep apnea)   Insulin dependent type 2 diabetes mellitus (HCC)   Hyperglycemia   Obesity, Class III, BMI 40-49.9 (morbid obesity) (HCC)   Leukocytosis   Left leg swelling   Elevated troponin   AKI (acute kidney injury) (HCC)   Elevated brain natriuretic peptide (BNP) level  Hypotension BP was 77/54 on arrival, this improved to 97/70 with a MAP of 80 after IV fluid hydration Home BP meds will be held at this time due to soft BP  History of pulmonary embolus Patient was on Xarelto at home, this was held and he was started on IV heparin drip in the ED due to suspicion for acute on chronic PE. CT angiography with contrast cannot be done due to acute kidney injury, VQ scan will be done in the morning  Elevated troponin possibly secondary to supply demand mismatch Troponin 553>528, patient denies any ongoing chest pain at bedside EKG showed sinus rhythm at a rate of 96 bpm Patient was started on heparin drip, this may need to be stopped in the morning consideration troponin was already trending down with plan to restart patient on home Xarelto.  Acute kidney injury Creatinine on admission= 3.53, baseline creatinine = 1.2-1.3 On diuretics and ACE inhibitor will be temporarily held at this time Renally adjust medications, avoid nephrotoxic agents/dehydration/hypotension  Elevated BNP  BNP 199, this is unreliable at  this time considering elevated creatinine  level and patient being obese.  He complained of decreased tolerance to exercise with shortness of breath on exertion. Last echocardiogram done on on 11/02/2019 showed LVEF of 55 to 60% A repeat echocardiogram will be done to rule out CHF Continue total input/output, daily weights  Continue Cardiac diet   Left lower extremity swelling rule out DVT LLE U/S in the morning to rule out DVT  Hyperglycemia secondary to poorly controlled type 2 diabetes mellitus Continue insulin sliding scale and hypoglycemia protocol Metformin will be temporarily held at this time  Obstructive sleep apnea Patient states that he has not used his CPAP in 2.5 years and that his wife told him that recently he occasionally stops breathing when he goes to sleep.  He requested for CPAP while at the hospital. Patient be provided with CPAP Patient will need a repeat outpatient sleep study and CPAP  CAD s/p PCI Continue Plavix and statin Beta-blocker will be held at this time due to soft BP  Essential hypertension BP will be held at this time due to soft BP  Hyperlipidemia Continue statin  GERD Continue Protonix  COPD (not in acute exacerbation) Continue Ventolin per home regimen  Obesity class III (BMI 41.50) This was calculated based on patient's height and weight Height 6' 5"  Weight 158.8 kg Diet and exercise and weight loss program will be discussed with patient prior to discharge   DVT prophylaxis: Heparin drip  Code Status: Full code  Family Communication: None at bedside  Disposition Plan:  Patient is from:                        home Anticipated DC to:                   SNF or family members home Anticipated DC date:               2-3 days Anticipated DC barriers:         Unstable for discharge at this time due to hypotension requiring gentle hydration and increased shortness of breath with LLE swelling and pending LLE ultrasound as well as acute kidney injury.     Consults called:  None  Admission status: Observation    Bernadette Hoit MD [facility]  If 7PM-7AM, please contact night-coverage [email or web address]  07/18/2020, 6:30 AM

## 2020-07-18 NOTE — Care Management Obs Status (Signed)
MEDICARE OBSERVATION STATUS NOTIFICATION   Patient Details  Name: Joseph [name] MRN: [id number] Date of Birth: [birth year]   Medicare Observation Status Notification Given:  Yes (RN delivered letter to patient)    Corey Harold 07/18/2020, 4:29 PM

## 2020-07-18 NOTE — Progress Notes (Signed)
ANTICOAGULATION CONSULT NOTE -   Pharmacy Consult for IV heparin Indication: chest pain/ACS  Allergies  Allergen Reactions  . Sulfa Antibiotics Other (See Comments)    headache    Patient Measurements: Height: 6\' 5"  (195.6 cm) Weight: (!) 158.8 kg (350 lb) IBW/kg (Calculated) : 89.1 Heparin Dosing Weight: 125 kg  Vital Signs: BP: 94/66 (07/30 0930) Pulse Rate: 83 (07/30 0930)  Labs: Recent Labs    07/18/20 0100 07/18/20 0243 07/18/20 1050  HGB 10.0* 10.4*  --   HCT 31.9* 33.8*  --   PLT 204 216  --   APTT 70*  --  52*  HEPARINUNFRC 2.00*  --  >2.20*  CREATININE 3.53*  --   --   TROPONINIHS 553* 528*  --     Estimated Creatinine Clearance: 42.8 mL/min (A) (by C-G formula based on SCr of 3.53 mg/dL (H)).   Medical History: Past Medical History:  Diagnosis Date  . Anxiety   . Arthritis    "knees, shoulders, hips, ankles" (07/29/2016)  . Asthma   . CAD (coronary artery disease)    a. 2017: s/p BMS to distal Cx; b. LHC 05/30/2019: 80% mid, distal RCA s/p DES, 30% narrowing of d LM, widely patent LAD w/ luminal irregularities, widely patent stent in dCX  w/ 90+% stenosis distal to stent beofre small trifurcating obtuse marginal (potentially area of restenosis)  . Chronic bronchitis (HCC)   . Chronic ulcer of right great toe ([facility]CC) 09/19/2017  . COPD (chronic obstructive pulmonary disease) (H[facility])   . Depression   . DVT (deep venous thrombosis) ([facility])   . GERD (gastroesophageal reflux disease)   . Hyperlipidemia   . Hypertension   . Myocardial infarction ([facility]) 1996   "light one"  . PE (pulmonary embolism) 04/2013   On chronic Xarelto  . Peripheral nerve disease   . Pneumonia "several times"  . Type II diabetes mellitus ([facility])     Medications:  Scheduled:  . atorvastatin  80 mg Oral q1800  . clopidogrel  75 mg Oral Daily  . insulin aspart  0-15 Units Subcutaneous TID WC  . insulin aspart  0-5 Units Subcutaneous QHS  . pantoprazole  40 mg Oral Daily    Infusions:  . cefTRIAXone (ROCEPHIN)  IV    . heparin 1,500 Units/hr (07/18/20 0234)    Assessment: 49 yo male presented with CP to start IV heparin for ACS. Baseline labs already drawn - SCr elevated at 3.53. Will need baseline aPTT and heparin level. Note that patient takes Xarelto for hx DVT/PE. Per RN, patient informed her that the last time he took his Xarelto 20mg  was 7/29 at 1130am.   APTT is subtherapeutic at 52.  Goal of Therapy:  Heparin level 0.3-0.7 units/ml aPTT 66-102 seconds Monitor platelets by anticoagulation protocol: Yes   Plan:   Increase IV heparin at rate of 1750 units/hr with NO bolus  Check another aPTT in ~6-8 hours  Daily APTT and heparin level   Daily CBC  Monitor for S/S of bleeding  , BS 8/29, BCPS Clinical Pharmacist Pager [phone] 07/18/2020,12:17 PM

## 2020-07-18 NOTE — Progress Notes (Signed)
*  PRELIMINARY RESULTS* Echocardiogram 2D Echocardiogram with definity has been performed.  Joseph [name] 07/18/2020, 3:57 PM

## 2020-07-19 DIAGNOSIS — I959 Hypotension, unspecified: Secondary | ICD-10-CM | POA: Diagnosis present

## 2020-07-19 DIAGNOSIS — I214 Non-ST elevation (NSTEMI) myocardial infarction: Secondary | ICD-10-CM | POA: Diagnosis not present

## 2020-07-19 DIAGNOSIS — E11628 Type 2 diabetes mellitus with other skin complications: Secondary | ICD-10-CM | POA: Diagnosis present

## 2020-07-19 DIAGNOSIS — Z86711 Personal history of pulmonary embolism: Secondary | ICD-10-CM | POA: Diagnosis not present

## 2020-07-19 DIAGNOSIS — R0789 Other chest pain: Secondary | ICD-10-CM | POA: Diagnosis not present

## 2020-07-19 DIAGNOSIS — R739 Hyperglycemia, unspecified: Secondary | ICD-10-CM | POA: Diagnosis not present

## 2020-07-19 DIAGNOSIS — R778 Other specified abnormalities of plasma proteins: Secondary | ICD-10-CM | POA: Diagnosis present

## 2020-07-19 DIAGNOSIS — E1165 Type 2 diabetes mellitus with hyperglycemia: Secondary | ICD-10-CM | POA: Diagnosis present

## 2020-07-19 DIAGNOSIS — I5033 Acute on chronic diastolic (congestive) heart failure: Secondary | ICD-10-CM | POA: Diagnosis not present

## 2020-07-19 DIAGNOSIS — A419 Sepsis, unspecified organism: Secondary | ICD-10-CM | POA: Diagnosis present

## 2020-07-19 DIAGNOSIS — K219 Gastro-esophageal reflux disease without esophagitis: Secondary | ICD-10-CM | POA: Diagnosis present

## 2020-07-19 DIAGNOSIS — Z20822 Contact with and (suspected) exposure to covid-19: Secondary | ICD-10-CM | POA: Diagnosis present

## 2020-07-19 DIAGNOSIS — I25110 Atherosclerotic heart disease of native coronary artery with unstable angina pectoris: Secondary | ICD-10-CM | POA: Diagnosis present

## 2020-07-19 DIAGNOSIS — F419 Anxiety disorder, unspecified: Secondary | ICD-10-CM | POA: Diagnosis present

## 2020-07-19 DIAGNOSIS — M7989 Other specified soft tissue disorders: Secondary | ICD-10-CM | POA: Diagnosis present

## 2020-07-19 DIAGNOSIS — G4733 Obstructive sleep apnea (adult) (pediatric): Secondary | ICD-10-CM | POA: Diagnosis present

## 2020-07-19 DIAGNOSIS — I110 Hypertensive heart disease with heart failure: Secondary | ICD-10-CM | POA: Diagnosis present

## 2020-07-19 DIAGNOSIS — D62 Acute posthemorrhagic anemia: Secondary | ICD-10-CM | POA: Diagnosis not present

## 2020-07-19 DIAGNOSIS — Z01810 Encounter for preprocedural cardiovascular examination: Secondary | ICD-10-CM | POA: Diagnosis not present

## 2020-07-19 DIAGNOSIS — E785 Hyperlipidemia, unspecified: Secondary | ICD-10-CM | POA: Diagnosis present

## 2020-07-19 DIAGNOSIS — J449 Chronic obstructive pulmonary disease, unspecified: Secondary | ICD-10-CM | POA: Diagnosis present

## 2020-07-19 DIAGNOSIS — N179 Acute kidney failure, unspecified: Secondary | ICD-10-CM | POA: Diagnosis present

## 2020-07-19 DIAGNOSIS — E119 Type 2 diabetes mellitus without complications: Secondary | ICD-10-CM | POA: Diagnosis not present

## 2020-07-19 DIAGNOSIS — J9811 Atelectasis: Secondary | ICD-10-CM | POA: Diagnosis not present

## 2020-07-19 DIAGNOSIS — I248 Other forms of acute ischemic heart disease: Secondary | ICD-10-CM | POA: Diagnosis present

## 2020-07-19 DIAGNOSIS — R6520 Severe sepsis without septic shock: Secondary | ICD-10-CM | POA: Diagnosis present

## 2020-07-19 DIAGNOSIS — Z6841 Body Mass Index (BMI) 40.0 and over, adult: Secondary | ICD-10-CM | POA: Diagnosis not present

## 2020-07-19 DIAGNOSIS — L03116 Cellulitis of left lower limb: Secondary | ICD-10-CM | POA: Diagnosis present

## 2020-07-19 LAB — COMPREHENSIVE METABOLIC PANEL
ALT: 15 U/L (ref 0–44)
AST: 13 U/L — ABNORMAL LOW (ref 15–41)
Albumin: 3 g/dL — ABNORMAL LOW (ref 3.5–5.0)
Alkaline Phosphatase: 48 U/L (ref 38–126)
Anion gap: 8 (ref 5–15)
BUN: 53 mg/dL — ABNORMAL HIGH (ref 6–20)
CO2: 21 mmol/L — ABNORMAL LOW (ref 22–32)
Calcium: 8.2 mg/dL — ABNORMAL LOW (ref 8.9–10.3)
Chloride: 109 mmol/L (ref 98–111)
Creatinine, Ser: 2.26 mg/dL — ABNORMAL HIGH (ref 0.61–1.24)
GFR calc Af Amer: 39 mL/min — ABNORMAL LOW (ref >60)
GFR calc non Af Amer: 33 mL/min — ABNORMAL LOW (ref >60)
Glucose, Bld: 259 mg/dL — ABNORMAL HIGH (ref 70–99)
Potassium: 5 mmol/L (ref 3.5–5.1)
Sodium: 138 mmol/L (ref 135–145)
Total Bilirubin: 0.4 mg/dL (ref 0.3–1.2)
Total Protein: 6.6 g/dL (ref 6.5–8.1)

## 2020-07-19 LAB — CBC
HCT: 31.8 % — ABNORMAL LOW (ref 39.0–52.0)
Hemoglobin: 10.1 g/dL — ABNORMAL LOW (ref 13.0–17.0)
MCH: 29.6 pg (ref 26.0–34.0)
MCHC: 31.8 g/dL (ref 30.0–36.0)
MCV: 93.3 fL (ref 80.0–100.0)
Platelets: 237 10*3/uL (ref 150–400)
RBC: 3.41 MIL/uL — ABNORMAL LOW (ref 4.22–5.81)
RDW: 13.6 % (ref 11.5–15.5)
WBC: 8.7 10*3/uL (ref 4.0–10.5)
nRBC: 0 % (ref 0.0–0.2)

## 2020-07-19 LAB — LIPID PANEL
Cholesterol: 158 mg/dL (ref 0–200)
HDL: 20 mg/dL — ABNORMAL LOW (ref >40)
LDL Cholesterol: 101 mg/dL — ABNORMAL HIGH (ref 0–99)
Total CHOL/HDL Ratio: 7.9 RATIO
Triglycerides: 187 mg/dL — ABNORMAL HIGH (ref <150)
VLDL: 37 mg/dL (ref 0–40)

## 2020-07-19 LAB — PROTIME-INR
INR: 1.3 — ABNORMAL HIGH (ref 0.8–1.2)
Prothrombin Time: 16 seconds — ABNORMAL HIGH (ref 11.4–15.2)

## 2020-07-19 LAB — GLUCOSE, CAPILLARY
Glucose-Capillary: 251 mg/dL — ABNORMAL HIGH (ref 70–99)
Glucose-Capillary: 234 mg/dL — ABNORMAL HIGH (ref 70–99)

## 2020-07-19 LAB — APTT
aPTT: 57 seconds — ABNORMAL HIGH (ref 24–36)
aPTT: 57 seconds — ABNORMAL HIGH (ref 24–36)

## 2020-07-19 LAB — TROPONIN I (HIGH SENSITIVITY): Troponin I (High Sensitivity): 389 ng/L (ref <18)

## 2020-07-19 NOTE — Progress Notes (Addendum)
ANTICOAGULATION CONSULT NOTE -   Pharmacy Consult for IV heparin Indication: chest pain/ACS  Allergies  Allergen Reactions  . Sulfa Antibiotics Other (See Comments)    headache    Patient Measurements: Height: 6\' 5"  (195.6 cm) Weight: (!) 173.4 kg (382 lb 4.4 oz) IBW/kg (Calculated) : 89.1 Heparin Dosing Weight: 125 kg  Vital Signs: Temp: 98.1 F (36.7 C) (07/31 1637) Temp Source: Oral (07/31 1637) BP: 103/63 (07/31 1200) Pulse Rate: 80 (07/31 1637)  Labs: Recent Labs    07/18/20 0100 07/18/20 0100 07/18/20 0243 07/18/20 1050 07/18/20 1050 07/18/20 1946 07/19/20 0459 07/19/20 1551  HGB 10.0*   < > 10.4*  --   --   --  10.1*  --   HCT 31.9*  --  33.8*  --   --   --  31.8*  --   PLT 204  --  216  --   --   --  237  --   APTT 70*   < >  --  52*   < > 54* 57* 57*  LABPROT  --   --   --   --   --   --  16.0*  --   INR  --   --   --   --   --   --  1.3*  --   HEPARINUNFRC 2.00*  --   --  >2.20*  --   --   --   --   CREATININE 3.53*  --   --   --   --   --  2.26*  --   TROPONINIHS 553*  --  528*  --   --   --  389*  --    < > = values in this interval not displayed.    Estimated Creatinine Clearance: 70.2 mL/min (A) (by C-G formula based on SCr of 2.26 mg/dL (H)).   Medical History: Past Medical History:  Diagnosis Date  . Anxiety   . Arthritis    "knees, shoulders, hips, ankles" (07/29/2016)  . Asthma   . CAD (coronary artery disease)    a. 2017: s/p BMS to distal Cx; b. LHC 05/30/2019: 80% mid, distal RCA s/p DES, 30% narrowing of d LM, widely patent LAD w/ luminal irregularities, widely patent stent in dCX  w/ 90+% stenosis distal to stent beofre small trifurcating obtuse marginal (potentially area of restenosis)  . Chronic bronchitis ([facility]CC)   . Chronic ulcer of right great toe ([facility]) 09/19/2017  . COPD (chronic obstructive pulmonary disease) ([facility])   . Depression   . DVT (deep venous thrombosis) ([facility])   . GERD (gastroesophageal reflux disease)   .  Hyperlipidemia   . Hypertension   . Myocardial infarction ([facility]) 1996   "light one"  . PE (pulmonary embolism) 04/2013   On chronic Xarelto  . Peripheral nerve disease   . Pneumonia "several times"  . Type II diabetes mellitus ([facility])     Medications:  Scheduled:  . atorvastatin  80 mg Oral q1800  . buPROPion  150 mg Oral BID  . carvedilol  3.125 mg Oral BID  . Chlorhexidine Gluconate Cloth  6 each Topical Daily  . clopidogrel  75 mg Oral Daily  . doxycycline  100 mg Oral BID  . escitalopram  10 mg Oral Daily  . fenofibrate  54 mg Oral Daily  . [START ON 07/21/2020] fluconazole  100 mg Oral Q Mon  . insulin aspart  0-15 Units Subcutaneous TID WC  .  insulin aspart  0-5 Units Subcutaneous QHS  . insulin glargine  40 Units Subcutaneous BID  . pantoprazole  40 mg Oral Daily  . pregabalin  150 mg Oral TID   Infusions:  . cefTRIAXone (ROCEPHIN)  IV 2 g (07/19/20 1152)  . heparin 2,000 Units/hr (07/19/20 1406)    Assessment: 49 yo male presented with CP to start IV heparin for ACS. Baseline labs already drawn - SCr elevated at 3.53. Will need baseline aPTT and heparin level. Note that patient takes Xarelto for hx DVT/PE. Per RN, patient informed her that the last time he took his Xarelto 20mg  was 7/29 at 1130am.   APTT is subtherapeutic at 57 this afternoon. Reviewed with RN and no problems with infusion. Will adjust.   Goal of Therapy:  Heparin level 0.3-0.7 units/ml aPTT 66-102 seconds Monitor platelets by anticoagulation protocol: Yes   Plan:   Increase IV heparin at rate of 2250 units/hr with NO bolus  Check another aPTT in ~6-8 hours  Daily APTT and heparin level   Daily CBC  Monitor for S/S of bleeding  8/29, BS Elder Cyphers, BCPS Clinical Pharmacist Pager [phone] 07/19/2020,5:19 PM

## 2020-07-19 NOTE — Progress Notes (Signed)
ANTICOAGULATION CONSULT NOTE -   Pharmacy Consult for IV heparin Indication: chest pain/ACS  Allergies  Allergen Reactions  . Sulfa Antibiotics Other (See Comments)    headache    Patient Measurements: Height: 6\' 5"  (195.6 cm) Weight: (!) 173.4 kg (382 lb 4.4 oz) IBW/kg (Calculated) : 89.1 Heparin Dosing Weight: 125 kg  Vital Signs: Temp: 98.1 F (36.7 C) (07/31 0754) Temp Source: Oral (07/31 0754) BP: 98/67 (07/31 0700) Pulse Rate: 82 (07/31 0700)  Labs: Recent Labs    07/18/20 0100 07/18/20 0100 07/18/20 0243 07/18/20 1050 07/18/20 1946 07/19/20 0459  HGB 10.0*   < > 10.4*  --   --  10.1*  HCT 31.9*  --  33.8*  --   --  31.8*  PLT 204  --  216  --   --  237  APTT 70*   < >  --  52* 54* 57*  LABPROT  --   --   --   --   --  16.0*  INR  --   --   --   --   --  1.3*  HEPARINUNFRC 2.00*  --   --  >2.20*  --   --   CREATININE 3.53*  --   --   --   --  2.26*  TROPONINIHS 553*  --  528*  --   --  389*   < > = values in this interval not displayed.    Estimated Creatinine Clearance: 70.2 mL/min (A) (by C-G formula based on SCr of 2.26 mg/dL (H)).   Medical History: Past Medical History:  Diagnosis Date  . Anxiety   . Arthritis    "knees, shoulders, hips, ankles" (07/29/2016)  . Asthma   . CAD (coronary artery disease)    a. 2017: s/p BMS to distal Cx; b. LHC 05/30/2019: 80% mid, distal RCA s/p DES, 30% narrowing of d LM, widely patent LAD w/ luminal irregularities, widely patent stent in dCX  w/ 90+% stenosis distal to stent beofre small trifurcating obtuse marginal (potentially area of restenosis)  . Chronic bronchitis (HCC)   . Chronic ulcer of right great toe (HCC) 09/19/2017  . COPD (chronic obstructive pulmonary disease) (HCC)   . Depression   . DVT (deep venous thrombosis) (HCC)   . GERD (gastroesophageal reflux disease)   . Hyperlipidemia   . Hypertension   . Myocardial infarction (HCC) 1996   "light one"  . PE (pulmonary embolism) 04/2013   On  chronic Xarelto  . Peripheral nerve disease   . Pneumonia "several times"  . Type II diabetes mellitus (HCC)     Medications:  Scheduled:  . atorvastatin  80 mg Oral q1800  . buPROPion  150 mg Oral BID  . carvedilol  3.125 mg Oral BID  . Chlorhexidine Gluconate Cloth  6 each Topical Daily  . clopidogrel  75 mg Oral Daily  . doxycycline  100 mg Oral BID  . escitalopram  10 mg Oral Daily  . fenofibrate  54 mg Oral Daily  . [START ON 07/21/2020] fluconazole  100 mg Oral Q Mon  . insulin aspart  0-15 Units Subcutaneous TID WC  . insulin aspart  0-5 Units Subcutaneous QHS  . insulin glargine  40 Units Subcutaneous BID  . pantoprazole  40 mg Oral Daily  . pregabalin  150 mg Oral TID   Infusions:  . cefTRIAXone (ROCEPHIN)  IV Stopped (07/18/20 1304)  . heparin 1,750 Units/hr (07/19/20 0025)    Assessment: 49 yo  male presented with CP to start IV heparin for ACS. Baseline labs already drawn - SCr elevated at 3.53. Will need baseline aPTT and heparin level. Note that patient takes Xarelto for hx DVT/PE. Per RN, patient informed her that the last time he took his Xarelto 20mg  was 7/29 at 1130am.   APTT is subtherapeutic at 57 this AM  Goal of Therapy:  Heparin level 0.3-0.7 units/ml aPTT 66-102 seconds Monitor platelets by anticoagulation protocol: Yes   Plan:   Increase IV heparin at rate of 2000 units/hr with NO bolus  Check another aPTT in ~6-8 hours  Daily APTT and heparin level   Daily CBC  Monitor for S/S of bleeding  8/29, BS Elder Cyphers, BCPS Clinical Pharmacist Pager [phone] 07/19/2020,8:38 AM

## 2020-07-19 NOTE — Progress Notes (Addendum)
Patient Demographics:    Joseph [name], is a 49 y.o. male, DOB - 10-Jun-1971, YIA:[id number]  Admit date - 07/17/2020   Admitting Physician Bernadette Hoit, DO  Outpatient Primary MD for the patient is [name], NP  LOS - 0   Chief Complaint  Patient presents with  . Chest Pain        Subjective:    Joseph [name] today has no fevers, no emesis,  No chest pain,   -Shortness of breath and dyspnea on exertion improvement -Voided several times into the toilet so input and output is inaccurate  Assessment  & Plan :    Principal Problem:   Severe sepsis --Severe Sepsis secondary to Lt leg/Foot Cellulitis with hypotension and AKI Active Problems:   AKI (acute kidney injury) ([place])   CAD -S/P PCI 11/02/19    Severe Sepsis secondary to Lt leg/Foot Cellulitis   Dyslipidemia   History of pulmonary embolus (PE)   OSA (obstructive sleep apnea)   Insulin dependent type 2 diabetes mellitus (HCC)   Hyperglycemia   COPD (chronic obstructive pulmonary disease) (HCC)   Obesity, Class III, BMI 40-49.9 (morbid obesity) (HCC)   Leukocytosis   Hypotension   Left leg swelling   Elevated troponin   Elevated brain natriuretic peptide (BNP) level   GERD (gastroesophageal reflux disease)    Brief Summary:- 49 y.o.malewith past medical history of morbid obesity/OSA, history of CAD (s/p BMS to LCx in 2017, DES to distal RCA in 05/2019, repeat cath in 10/2019 with DES to distal LCx with patent stents along RCA and mid-LCx), history of DVT/PE (on Xarelto), HTN, HLD and tobacco use admitted on 07/18/2020 with atypical chest pains, troponin elevation, concerns about CHF and sepsis secondary to left lower extremity cellulitis with hypotension and AKI -Currently on IV heparin with no bleeding concerns   A/p 1)Severe sepsis secondary to left lower extremity cellulitis--- treat empirically with IV Rocephin and  doxycycline pending cultures -Patient met sepsis criteria on admission with leukocytosis, tachycardia, tachypnea with concomitant persistent hypotension and AKI --Tachycardia may not be as prominent  due to Coreg use PTA -Initial lactic acid was not elevated -Sepsis will explain patient's hypotension and AKI -Bps improved, decrease Coreg to 3.125 twice daily from 12.5 mg twice daily, discontinued lisinopril HCTZ due to soft BP and AKI -Sepsis pathophysiology improving, WBC is down to 8.7 from 13.8   2)AKI----acute kidney injury due to persistent hypotension in the setting of severe sepsis as above #1 compounded by lisinopril/HCTZ use --   creatinine on admission= 3.53 , baseline creatinine =1.1 to 1.2 -Creatinine has improved to 2.26  -- renally adjust medications, avoid nephrotoxic agents / dehydration  / hypotension -Stopped Metformin -Stopped lisinopril and HCTZ  3) left lower extremity cellulitis----please see photos in epic, treat as above #1 pending blood cultures -Lower extremity  venous Dopplers negative for DVT  4)Morbid Obesity AND OSA -Low calorie diet, portion control and increase physical activity discussed with patient -CPAP use advised -Body mass index is 45.36 kg/m.   5)Atypical chest pains AND h/o CAD--prior angioplasty and stents--- cardiology consult appreciated,  ---echo from 07/18/2020 with EF of 50 to 55%,  -Elevated troponin noted 553>>528>>389 -EKG not consistent with ACS -Continue IV heparin for another 24 hours -  Coreg adjusted as above #1 due to soft BP -Plavix as ordered, Lipitor as ordered -Per cardiology service not a good candidate for LHC due to kidney function LDL 101, HDL 20  6)H/o Prior DVT/PE-lower extremity venous Dopplers negative for DVT, -VQ scan without PE -Hold Xarelto, currently on IV heparin as above in #5  7) chronic pain syndrome--- continue home opiate regimen  8) acute anemia--etiology unclear, hemoglobin was 13 on  11/03/2019 -Hemoglobin is down to 10.1 -Check FOBT, anemia work-up ordered -No evidence of ongoing/obvious bleeding at this time  9)DM2-A1c is 9.4, Lantus insulin as well as sliding scale insulin as ordered  Disposition/Need for in-Hospital Stay- patient unable to be discharged at this time due to --- cellulitis and sepsis requiring IV antibiotics and chest pain and congestive heart failure requiring IV heparin  Status is: Inpatient  Remains inpatient appropriate because:cellulitis and sepsis requiring IV antibiotics and chest pain and congestive heart failure requiring IV heparin   Disposition: The patient is from: Home              Anticipated d/c is to: Home              Anticipated d/c date is: 2 days              Patient currently is not medically stable to d/c. Barriers: Not Clinically Stable- -cellulitis and sepsis requiring IV antibiotics and chest pain and congestive heart failure requiring IV heparin   Code Status : Full  Family Communication:    (patient is alert, awake and coherent)   Consults  :  cardiology  DVT Prophylaxis  : IV heparin  Lab Results  Component Value Date   PLT 237 07/19/2020    Inpatient Medications  Scheduled Meds: . atorvastatin  80 mg Oral q1800  . buPROPion  150 mg Oral BID  . carvedilol  3.125 mg Oral BID  . Chlorhexidine Gluconate Cloth  6 each Topical Daily  . clopidogrel  75 mg Oral Daily  . doxycycline  100 mg Oral BID  . escitalopram  10 mg Oral Daily  . fenofibrate  54 mg Oral Daily  . [START ON 07/21/2020] fluconazole  100 mg Oral Q Mon  . insulin aspart  0-15 Units Subcutaneous TID WC  . insulin aspart  0-5 Units Subcutaneous QHS  . insulin glargine  40 Units Subcutaneous BID  . pantoprazole  40 mg Oral Daily  . pregabalin  150 mg Oral TID   Continuous Infusions: . cefTRIAXone (ROCEPHIN)  IV 2 g (07/19/20 1152)  . heparin 2,000 Units/hr (07/19/20 1406)   PRN Meds:.albuterol, ALPRAZolam, fluticasone,  oxyCODONE    Anti-infectives (From admission, onward)   Start     Dose/Rate Route Frequency Ordered Stop   07/21/20 1000  fluconazole (DIFLUCAN) tablet 100 mg     Discontinue     100 mg Oral Every Mon 07/18/20 1833     07/18/20 2200  doxycycline (VIBRA-TABS) tablet 100 mg     Discontinue     100 mg Oral 2 times daily 07/18/20 1833     07/18/20 1000  cefTRIAXone (ROCEPHIN) 2 g in sodium chloride 0.9 % 100 mL IVPB     Discontinue     2 g 200 mL/hr over 30 Minutes Intravenous Every 24 hours 07/18/20 0946          Objective:   Vitals:   07/19/20 1000 07/19/20 1100 07/19/20 1127 07/19/20 1200  BP: (!) 108/90 114/65  (!) 103/63  Pulse: 82  80 79 84  Resp: 16 (!) 11 (!) 11   Temp:   98.5 F (36.9 C)   TempSrc:   Oral   SpO2: 94% 93% 93% 94%  Weight:      Height:        Wt Readings from Last 3 Encounters:  07/19/20 (!) 173.4 kg  11/12/19 (!) 175.5 kg  11/03/19 (!) 176.5 kg     Intake/Output Summary (Last 24 hours) at 07/19/2020 1433 Last data filed at 07/19/2020 1200 Gross per 24 hour  Intake 1682.3 ml  Output 1325 ml  Net 357.3 ml   Physical Exam Gen:- Awake Alert, morbidly obese, speaking in short sentences HEENT:- [place].AT, No sclera icterus Neck-Supple Neck, .  Lungs-diminished breath sounds, faint bibasilar rales CV- S1, S2 normal, regular  Abd-  +ve B.Sounds, Abd Soft, No tenderness, increased truncal adiposity Extremity/Skin:-2+ pitting edema, pedal pulses are present Psych-affect is appropriate, oriented x3 Neuro-generalized weakness no new focal deficits, no tremors MSK/FEET-left fourth toe, left foot and left leg with significant left lower extremity erythema, warmth, tenderness and streaking consistent with cellulitis--please see photos in epic -Status post prior amputation of right big toe   Data Review:   Micro Results Recent Results (from the past 240 hour(s))  SARS Coronavirus 2 by RT PCR (hospital order, performed in [facility] hospital lab)  Nasopharyngeal Nasopharyngeal Swab     Status: None   Collection Time: 07/18/20  2:27 AM   Specimen: Nasopharyngeal Swab  Result Value Ref Range Status   SARS Coronavirus 2 NEGATIVE NEGATIVE Final    Comment: (NOTE) SARS-CoV-2 target nucleic acids are NOT DETECTED.  The SARS-CoV-2 RNA is generally detectable in upper and lower respiratory specimens during the acute phase of infection. The lowest concentration of SARS-CoV-2 viral copies this assay can detect is 250 copies / mL. A negative result does not preclude SARS-CoV-2 infection and should not be used as the sole basis for treatment or other patient management decisions.  A negative result may occur with improper specimen collection / handling, submission of specimen other than nasopharyngeal swab, presence of viral mutation(s) within the areas targeted by this assay, and inadequate number of viral copies (<250 copies / mL). A negative result must be combined with clinical observations, patient history, and epidemiological information.  Fact Sheet for Patients:   StrictlyIdeas.no  Fact Sheet for Healthcare Providers: BankingDealers.co.za  This test is not yet approved or  cleared by the [place] FDA and has been authorized for detection and/or diagnosis of SARS-CoV-2 by FDA under an Emergency Use Authorization (EUA).  This EUA will remain in effect (meaning this test can be used) for the duration of the COVID-19 declaration under Section 564(b)(1) of the Act, 21 U.S.C. section 360bbb-3(b)(1), unless the authorization is terminated or revoked sooner.  Performed at [facility], [address], [place], [place] [address]   Culture, blood (routine x 2)     Status: None (Preliminary result)   Collection Time: 07/18/20 10:50 AM   Specimen: BLOOD RIGHT HAND  Result Value Ref Range Status   Specimen Description BLOOD RIGHT HAND  Final   Special Requests   Final    BOTTLES DRAWN  AEROBIC AND ANAEROBIC Blood Culture results may not be optimal due to an inadequate volume of blood received in culture bottles   Culture   Final    NO GROWTH < 24 HOURS Performed at [facility], [address], [place],  [address]    Report Status PENDING  Incomplete  Culture, blood (routine x 2)     Status: None (Preliminary result)   Collection Time: 07/18/20 10:50 AM   Specimen: BLOOD LEFT HAND  Result Value Ref Range Status   Specimen Description BLOOD LEFT HAND  Final   Special Requests   Final    BOTTLES DRAWN AEROBIC AND ANAEROBIC Blood Culture adequate volume   Culture   Final    NO GROWTH < 24 HOURS Performed at [facility], [address], [place], [place] [address]    Report Status PENDING  Incomplete  MRSA PCR Screening     Status: None   Collection Time: 07/18/20  4:12 PM   Specimen: Nasal Mucosa; Nasopharyngeal  Result Value Ref Range Status   MRSA by PCR NEGATIVE NEGATIVE Final    Comment:        The GeneXpert MRSA Assay (FDA approved for NASAL specimens only), is one component of a comprehensive MRSA colonization surveillance program. It is not intended to diagnose MRSA infection nor to guide or monitor treatment for MRSA infections. Performed at [facility], [address] [place], [place] [address]     Radiology Reports NM Pulmonary Perfusion  Result Date: 07/18/2020 CLINICAL DATA:  Shortness of breath EXAM: NUCLEAR MEDICINE PERFUSION LUNG SCAN TECHNIQUE: Perfusion images were obtained in multiple projections after intravenous injection of radiopharmaceutical. Views: Anterior, posterior, left lateral, right lateral, RPO, LPO, RAO, LAO RADIOPHARMACEUTICALS:  4.4 mCi Tc-36mMAA IV COMPARISON:  Chest radiograph July 18, 2020 FINDINGS: Radiotracer uptake is homogeneous and symmetric bilaterally. No perfusion defects evident. IMPRESSION: No perfusion defects evident. No findings indicative of pulmonary embolus. Electronically Signed   By: WLowella GripIII M.D.   On: 07/18/2020 12:07   UKoreaVenous Img Lower Unilateral Left (DVT)  Result Date: 07/18/2020 CLINICAL DATA:  LEFT leg swelling, pain, and redness for 6 weeks EXAM: LEFT LOWER EXTREMITY VENOUS DOPPLER ULTRASOUND TECHNIQUE: Gray-scale sonography with compression, as well as color and duplex ultrasound, were performed to evaluate the deep venous system(s) from the level of the common femoral vein through the popliteal and proximal calf veins. COMPARISON:  11/29/2007 FINDINGS: VENOUS Normal compressibility of the common femoral, superficial femoral, and popliteal veins, as well as the visualized calf veins. Visualized portions of profunda femoral vein and great saphenous vein unremarkable. No filling defects to suggest DVT on grayscale or color Doppler imaging. Doppler waveforms show normal direction of venous flow, normal respiratory plasticity and response to augmentation. Limited views of the contralateral common femoral vein are unremarkable. OTHER None. Limitations: none IMPRESSION: No evidence of deep venous thrombosis in the LEFT lower extremity. Electronically Signed   By: MLavonia DanaM.D.   On: 07/18/2020 13:38   DG Chest Port 1 View  Result Date: 07/18/2020 CLINICAL DATA:  Weakness and left-sided chest pain for 1 day EXAM: PORTABLE CHEST 1 VIEW COMPARISON:  11/01/2019 FINDINGS: Cardiac shadow is stable. Lungs are well aerated bilaterally. Mild interstitial changes are noted. No significant edema is noted. No focal infiltrate is seen. No bony abnormality is noted. IMPRESSION: Mild interstitial changes likely related to bronchitis Electronically Signed   By: MInez CatalinaM.D.   On: 07/18/2020 01:02   ECHOCARDIOGRAM COMPLETE  Result Date: 07/18/2020    ECHOCARDIOGRAM REPORT   [facility]   [facility]am: 07/18/2020 Medical Rec #:  [id number]      Height:       77.0 in Accession #:    [id number]     Weight:  350.0 lb Date of Birth:  04-29-1971       BSA:           2.837 m Patient Age:    62 years        BP:           109/78 mmHg Patient Gender: M               HR:           83 bpm. Exam Location:  [facility] Procedure: 2D Echo and Intracardiac Opacification Agent Indications:    CHF-Acute Diastolic 419.62 / I29.79  History:        Patient has prior history of Echocardiogram examinations, most                 recent 11/02/2019. CAD, COPD; Risk Factors:Current Smoker,                 Diabetes and Dyslipidemia. Elevated Troponin, GERD, Acute Kidney                 Injury, History of Pulmonary Embolus.  Sonographer:    Leavy Cella RDCS (AE) Referring Phys: [id number] OLADAPO ADEFESO IMPRESSIONS  1. Left ventricular ejection fraction, by estimation, is 50 to 55%. The left ventricle has low normal function. Left ventricular endocardial border not optimally defined to evaluate regional wall motion. There is mild left ventricular hypertrophy. Left ventricular diastolic parameters were normal.  2. Right ventricular systolic function is normal. The right ventricular size is normal. Tricuspid regurgitation signal is inadequate for assessing PA pressure.  3. The mitral valve is grossly normal. No evidence of mitral valve regurgitation.  4. The aortic valve was not well visualized. Aortic valve regurgitation is not visualized.  5. The inferior vena cava is normal in size with greater than 50% respiratory variability, suggesting right atrial pressure of 3 mmHg. FINDINGS  Left Ventricle: Left ventricular ejection fraction, by estimation, is 50 to 55%. The left ventricle has low normal function. Left ventricular endocardial border not optimally defined to evaluate regional wall motion. Definity contrast agent was given IV  to delineate the left ventricular endocardial borders. The left ventricular internal cavity size was normal in size. There is mild left ventricular hypertrophy. Left ventricular diastolic parameters were normal. Right Ventricle: The right ventricular size is normal. No  increase in right ventricular wall thickness. Right ventricular systolic function is normal. Tricuspid regurgitation signal is inadequate for assessing PA pressure. Left Atrium: Left atrial size was normal in size. Right Atrium: Right atrial size was normal in size. Pericardium: There is no evidence of pericardial effusion. Mitral Valve: The mitral valve is grossly normal. No evidence of mitral valve regurgitation. Tricuspid Valve: The tricuspid valve is not well visualized. Tricuspid valve regurgitation is trivial. Aortic Valve: The aortic valve was not well visualized. Aortic valve regurgitation is not visualized. Pulmonic Valve: The pulmonic valve was not well visualized. Pulmonic valve regurgitation is not visualized. Aorta: The aortic root is normal in size and structure. Venous: The inferior vena cava is normal in size with greater than 50% respiratory variability, suggesting right atrial pressure of 3 mmHg. IAS/Shunts: No atrial level shunt detected by color flow Doppler.  LEFT VENTRICLE PLAX 2D LVIDd:         4.76 cm  Diastology LVIDs:         4.09 cm  LV e' lateral:   10.80 cm/s LV PW:         1.27 cm  LV E/e'  lateral: 9.8 LV IVS:        1.15 cm  LV e' medial:    7.07 cm/s LVOT diam:     2.20 cm  LV E/e' medial:  15.0 LVOT Area:     3.80 cm  RIGHT VENTRICLE RV S prime:     9.79 cm/s TAPSE (M-mode): 2.3 cm LEFT ATRIUM             Index       RIGHT ATRIUM          Index LA diam:        3.60 cm 1.27 cm/m  RA Area:     8.31 cm LA Vol (A2C):   79.9 ml 28.16 ml/m RA Volume:   17.40 ml 6.13 ml/m LA Vol (A4C):   62.8 ml 22.13 ml/m LA Biplane Vol: 74.9 ml 26.40 ml/m   AORTA Ao Root diam: 3.60 cm MITRAL VALVE MV Area (PHT): 2.62 cm     SHUNTS MV Decel Time: 289 msec     Systemic Diam: 2.20 cm MV E velocity: 106.00 cm/s MV A velocity: 104.00 cm/s MV E/A ratio:  1.02 Rozann Lesches MD Electronically signed by Rozann Lesches MD Signature Date/Time: 07/18/2020/4:41:30 PM    Final      CBC Recent Labs  Lab  07/18/20 0100 07/18/20 0243 07/19/20 0459  WBC 13.2* 13.8* 8.7  HGB 10.0* 10.4* 10.1*  HCT 31.9* 33.8* 31.8*  PLT 204 216 237  MCV 94.4 97.4 93.3  MCH 29.6 30.0 29.6  MCHC 31.3 30.8 31.8  RDW 14.0 14.0 13.6  LYMPHSABS 1.7  --   --   MONOABS 1.1*  --   --   EOSABS 0.1  --   --   BASOSABS 0.0  --   --     Chemistries  Recent Labs  Lab 07/18/20 0100 07/19/20 0459  NA 134* 138  K 5.3* 5.0  CL 107 109  CO2 20* 21*  GLUCOSE 281* 259*  BUN 59* 53*  CREATININE 3.53* 2.26*  CALCIUM 7.7* 8.2*  MG 1.4*  --   AST  --  13*  ALT  --  15  ALKPHOS  --  48  BILITOT  --  0.4   ------------------------------------------------------------------------------------------------------------------ Recent Labs    07/19/20 0459  CHOL 158  HDL 20*  LDLCALC 101*  TRIG 187*  CHOLHDL 7.9    Lab Results  Component Value Date   HGBA1C 9.4 (H) 07/18/2020   ------------------------------------------------------------------------------------------------------------------ No results for input(s): TSH, T4TOTAL, T3FREE, THYROIDAB in the last 72 hours.  Invalid input(s): FREET3 ------------------------------------------------------------------------------------------------------------------ No results for input(s): VITAMINB12, FOLATE, FERRITIN, TIBC, IRON, RETICCTPCT in the last 72 hours.  Coagulation profile Recent Labs  Lab 07/19/20 0459  INR 1.3*    No results for input(s): DDIMER in the last 72 hours.  Cardiac Enzymes No results for input(s): CKMB, TROPONINI, MYOGLOBIN in the last 168 hours.  Invalid input(s): CK ------------------------------------------------------------------------------------------------------------------    Component Value Date/Time   BNP 199.0 (H) 07/18/2020 0100     Roxan Hockey M.D on 07/19/2020 at 2:33 PM  Go to [email or web address] - for contact info  [facility] - Office  [phone]

## 2020-07-19 NOTE — Progress Notes (Signed)
Notified Dr. Mariea Clonts by Amion that troponin is 389 critical result.

## 2020-07-20 LAB — CBC
HCT: 32.4 % — ABNORMAL LOW (ref 39.0–52.0)
Hemoglobin: 9.8 g/dL — ABNORMAL LOW (ref 13.0–17.0)
MCH: 28.4 pg (ref 26.0–34.0)
MCHC: 30.2 g/dL (ref 30.0–36.0)
MCV: 93.9 fL (ref 80.0–100.0)
Platelets: 259 10*3/uL (ref 150–400)
RBC: 3.45 MIL/uL — ABNORMAL LOW (ref 4.22–5.81)
RDW: 13.6 % (ref 11.5–15.5)
WBC: 8.8 10*3/uL (ref 4.0–10.5)
nRBC: 0 % (ref 0.0–0.2)

## 2020-07-20 LAB — APTT
aPTT: 72 seconds — ABNORMAL HIGH (ref 24–36)
aPTT: 61 seconds — ABNORMAL HIGH (ref 24–36)

## 2020-07-20 LAB — GLUCOSE, CAPILLARY
Glucose-Capillary: 287 mg/dL — ABNORMAL HIGH (ref 70–99)
Glucose-Capillary: 264 mg/dL — ABNORMAL HIGH (ref 70–99)
Glucose-Capillary: 232 mg/dL — ABNORMAL HIGH (ref 70–99)
Glucose-Capillary: 254 mg/dL — ABNORMAL HIGH (ref 70–99)

## 2020-07-20 LAB — BASIC METABOLIC PANEL
Anion gap: 9 (ref 5–15)
BUN: 45 mg/dL — ABNORMAL HIGH (ref 6–20)
CO2: 22 mmol/L (ref 22–32)
Calcium: 8.6 mg/dL — ABNORMAL LOW (ref 8.9–10.3)
Chloride: 108 mmol/L (ref 98–111)
Creatinine, Ser: 1.86 mg/dL — ABNORMAL HIGH (ref 0.61–1.24)
GFR calc Af Amer: 49 mL/min — ABNORMAL LOW (ref >60)
GFR calc non Af Amer: 42 mL/min — ABNORMAL LOW (ref >60)
Glucose, Bld: 311 mg/dL — ABNORMAL HIGH (ref 70–99)
Potassium: 5.5 mmol/L — ABNORMAL HIGH (ref 3.5–5.1)
Sodium: 139 mmol/L (ref 135–145)

## 2020-07-20 LAB — HEPARIN LEVEL (UNFRACTIONATED)
Heparin Unfractionated: 0.33 IU/mL (ref 0.30–0.70)
Heparin Unfractionated: 0.25 IU/mL — ABNORMAL LOW (ref 0.30–0.70)
Heparin Unfractionated: 0.35 IU/mL (ref 0.30–0.70)

## 2020-07-20 LAB — FOLATE: Folate: 5.4 ng/mL — ABNORMAL LOW (ref >5.9)

## 2020-07-20 LAB — IRON AND TIBC
Iron: 42 ug/dL — ABNORMAL LOW (ref 45–182)
Saturation Ratios: 13 % — ABNORMAL LOW (ref 17.9–39.5)
TIBC: 316 ug/dL (ref 250–450)
UIBC: 274 ug/dL

## 2020-07-20 LAB — FERRITIN: Ferritin: 250 ng/mL (ref 24–336)

## 2020-07-20 LAB — VITAMIN B12: Vitamin B-12: 150 pg/mL — ABNORMAL LOW (ref 180–914)

## 2020-07-20 MED ORDER — FOLIC ACID 1 MG PO TABS
1.0000 mg | ORAL_TABLET | Freq: Every day | ORAL | Status: DC
  Administered 2020-07-20 – 2020-07-23 (×4): 1 mg via ORAL
  Filled 2020-07-20 (×4): qty 1

## 2020-07-20 MED ORDER — SENNOSIDES-DOCUSATE SODIUM 8.6-50 MG PO TABS
2.0000 | ORAL_TABLET | Freq: Two times a day (BID) | ORAL | Status: DC
  Administered 2020-07-20 – 2020-07-23 (×7): 2 via ORAL
  Filled 2020-07-20 (×7): qty 2

## 2020-07-20 MED ORDER — FERROUS SULFATE 325 (65 FE) MG PO TABS
325.0000 mg | ORAL_TABLET | Freq: Every day | ORAL | Status: DC
  Administered 2020-07-21 – 2020-07-23 (×3): 325 mg via ORAL
  Filled 2020-07-20 (×3): qty 1

## 2020-07-20 MED ORDER — CYANOCOBALAMIN 1000 MCG/ML IJ SOLN
1000.0000 ug | Freq: Once | INTRAMUSCULAR | Status: AC
  Administered 2020-07-20: 1000 ug via INTRAMUSCULAR
  Filled 2020-07-20: qty 1

## 2020-07-20 NOTE — Progress Notes (Signed)
ANTICOAGULATION CONSULT NOTE -   Pharmacy Consult for IV heparin Indication: chest pain/ACS  Allergies  Allergen Reactions  . Sulfa Antibiotics Other (See Comments)    headache    Patient Measurements: Height: 6\' 5"  (195.6 cm) Weight: (!) 173.4 kg (382 lb 4.4 oz) IBW/kg (Calculated) : 89.1 Heparin Dosing Weight: 125 kg  Vital Signs: Temp: 97.5 F (36.4 C) (08/01 0512) Temp Source: Oral (08/01 0512) BP: 104/72 (08/01 0512) Pulse Rate: 72 (08/01 0512)  Labs: Recent Labs    07/18/20 0100 07/18/20 0100 07/18/20 0243 07/18/20 1050 07/18/20 1946 07/19/20 0459 07/19/20 1551 07/20/20 0624  HGB 10.0*   < > 10.4*  --   --  10.1*  --  9.8*  HCT 31.9*   < > 33.8*  --   --  31.8*  --  32.4*  PLT 204   < > 216  --   --  237  --  259  APTT 70*   < >  --  52*   < > 57* 57* 72*  LABPROT  --   --   --   --   --  16.0*  --   --   INR  --   --   --   --   --  1.3*  --   --   HEPARINUNFRC 2.00*  --   --  >2.20*  --   --   --  0.33  CREATININE 3.53*  --   --   --   --  2.26*  --  1.86*  TROPONINIHS 553*  --  528*  --   --  389*  --   --    < > = values in this interval not displayed.    Estimated Creatinine Clearance: 85.3 mL/min (A) (by C-G formula based on SCr of 1.86 mg/dL (H)).   Medical History: Past Medical History:  Diagnosis Date  . Anxiety   . Arthritis    "knees, shoulders, hips, ankles" (07/29/2016)  . Asthma   . CAD (coronary artery disease)    a. 2017: s/p BMS to distal Cx; b. LHC 05/30/2019: 80% mid, distal RCA s/p DES, 30% narrowing of d LM, widely patent LAD w/ luminal irregularities, widely patent stent in dCX  w/ 90+% stenosis distal to stent beofre small trifurcating obtuse marginal (potentially area of restenosis)  . Chronic bronchitis (HCC)   . Chronic ulcer of right great toe (HCC) 09/19/2017  . COPD (chronic obstructive pulmonary disease) (HCC)   . Depression   . DVT (deep venous thrombosis) (HCC)   . GERD (gastroesophageal reflux disease)   .  Hyperlipidemia   . Hypertension   . Myocardial infarction (HCC) 1996   "light one"  . PE (pulmonary embolism) 04/2013   On chronic Xarelto  . Peripheral nerve disease   . Pneumonia "several times"  . Type II diabetes mellitus (HCC)     Medications:  Scheduled:  . atorvastatin  80 mg Oral q1800  . buPROPion  150 mg Oral BID  . carvedilol  3.125 mg Oral BID  . Chlorhexidine Gluconate Cloth  6 each Topical Daily  . clopidogrel  75 mg Oral Daily  . doxycycline  100 mg Oral BID  . escitalopram  10 mg Oral Daily  . fenofibrate  54 mg Oral Daily  . [START ON 07/21/2020] fluconazole  100 mg Oral Q Mon  . insulin aspart  0-15 Units Subcutaneous TID WC  . insulin aspart  0-5 Units Subcutaneous QHS  .  insulin glargine  40 Units Subcutaneous BID  . pantoprazole  40 mg Oral Daily  . pregabalin  150 mg Oral TID   Infusions:  . cefTRIAXone (ROCEPHIN)  IV 2 g (07/20/20 0849)  . heparin 2,250 Units/hr (07/19/20 2018)    Assessment: 49 yo male presented with CP to start IV heparin for ACS. Baseline labs already drawn - SCr elevated at 3.53. Will need baseline aPTT and heparin level. Note that patient takes Xarelto for hx DVT/PE. Per RN, patient informed her that the last time he took his Xarelto 20mg  was 7/29 at 1130am.   APTT is therapeutic this AM at 72.  HL is therapeutic at 0.33  Goal of Therapy:  Heparin level 0.3-0.7 units/ml aPTT 66-102 seconds Monitor platelets by anticoagulation protocol: Yes   Plan:   Continue IV heparin at rate of 2250 units/hr with NO bolus  Check another aPTT and HL in ~6-8 hours  Daily APTT and heparin level   Daily CBC  Monitor for S/S of bleeding  8/29, BS Elder Cyphers, BCPS Clinical Pharmacist Pager [phone] 07/20/2020,9:20 AM

## 2020-07-20 NOTE — Progress Notes (Addendum)
Patient Demographics:    Joseph [name], is a 49 y.o. male, DOB - 1971/11/25, HLK:[id number]  Admit date - 07/17/2020   Admitting Physician Courage Denton Brick, MD  Outpatient Primary MD for the patient is [name], NP  LOS - 1   Chief Complaint  Patient presents with  . Chest Pain        Subjective:    Joseph [name] today has no fevers, no emesis,  -DOE persist, no further shortness of breath at rest -Complains of intermittent chest discomfort   Assessment  & Plan :    Principal Problem:   Severe sepsis --Severe Sepsis secondary to Lt leg/Foot Cellulitis with hypotension and AKI Active Problems:   AKI (acute kidney injury) ([place])   CAD -S/P PCI 11/02/19    Severe Sepsis secondary to Lt leg/Foot Cellulitis   Dyslipidemia   History of pulmonary embolus (PE)   OSA (obstructive sleep apnea)   Insulin dependent type 2 diabetes mellitus (HCC)   Hyperglycemia   COPD (chronic obstructive pulmonary disease) (HCC)   Obesity, Class III, BMI 40-49.9 (morbid obesity) (HCC)   Leukocytosis   Hypotension   Left leg swelling   Elevated troponin   Elevated brain natriuretic peptide (BNP) level   GERD (gastroesophageal reflux disease)    Brief Summary:- 49 y.o.malewith past medical history of morbid obesity/OSA, history of CAD (s/p BMS to LCx in 2017, DES to distal RCA in 05/2019, repeat cath in 10/2019 with DES to distal LCx with patent stents along RCA and mid-LCx), history of DVT/PE (on Xarelto), HTN, HLD and tobacco use admitted on 07/18/2020 with atypical chest pains, troponin elevation, concerns about CHF and sepsis secondary to left lower extremity cellulitis with hypotension and AKI -Currently on IV heparin with no bleeding concerns   A/p 1)Severe sepsis secondary to left lower extremity cellulitis--- treat empirically with IV Rocephin and doxycycline pending cultures -Patient met  sepsis criteria on admission with leukocytosis, tachycardia, tachypnea with concomitant persistent hypotension and AKI --Tachycardia may not be as prominent  due to Coreg use PTA -Initial lactic acid was not elevated -Sepsis will explain patient's hypotension and AKI -Bps improved, decrease Coreg to 3.125 twice daily from 12.5 mg twice daily, discontinued lisinopril HCTZ due to soft BP and AKI -Sepsis pathophysiology has resolved  --WBC is down to 8.8 from 13.8   2)AKI----acute kidney injury due to persistent hypotension in the setting of severe sepsis as above #1 compounded by lisinopril/HCTZ use --   creatinine on admission= 3.53 , baseline creatinine =1.1 to 1.2 -Creatinine has improved to 1.86  -- renally adjust medications, avoid nephrotoxic agents / dehydration  / hypotension -Stopped Metformin -Stopped lisinopril and HCTZ  3)Left lower extremity cellulitis----please see photos in epic, treat as above #1 pending blood cultures -Lower extremity  venous Dopplers negative for DVT  4)Morbid Obesity AND OSA -Low calorie diet, portion control and increase physical activity discussed with patient -CPAP use advised -Body mass index is 45.36 kg/m.  -PTA patient was not compliant with CPAP machine due to faulty machine, he will need to follow-up with pulmonologist post discharge for repeat sleep study so he can get a new CPAP machine  5)Atypical chest pains AND h/o CAD--prior angioplasty and stents--- cardiology consult appreciated,  ---echo from  07/18/2020 with EF of 50 to 55%,  -Elevated troponin noted 553>>528>>389 -EKG not consistent with ACS -Continue IV heparin until 07/21/2020 and if cardiology does not plan any interventions may go back on Xarelto at that time -Coreg adjusted as above #1 due to soft BP -Plavix as ordered, Lipitor as ordered -Per cardiology service not a good candidate for LHC due to kidney function LDL 101, HDL 20 --Continues to have intermittent chest  discomfort  6)H/o Prior DVT/PE-lower extremity venous Dopplers negative for DVT, -VQ scan without PE -Hold Xarelto, currently on IV heparin as above in #5  7)Chronic pain syndrome--- continue home opiate regimen  8)Acute Anemia--etiology unclear, hemoglobin was 13 on 11/03/2019 -Hemoglobin is down to 9.8 -Check FOBT,  anemia work-up reveals B12 deficiency (b12 low at 150), as well as folate deficiency (low at 5.4) and iron deficiency (low at 42 with iron saturation of 13) -Give monthly U20 shots -Give folic tablets -Iron supplementation -No evidence of ongoing/obvious bleeding at this time  9)DM2-A1c is 9.4, Lantus insulin as well as sliding scale insulin as ordered  10)Tobacco abuse-  Smoking cessation counseling for 4 minutes today, patient is not ready to use nicotine patch  I have discussed tobacco cessation with the patient.  I have counseled the patient regarding the negative impacts of continued tobacco use including but not limited to lung cancer, COPD, and cardiovascular disease.  I have discussed alternatives to tobacco and modalities that may help facilitate tobacco cessation including but not limited to biofeedback, hypnosis, and medications.  Total time spent with tobacco counseling was 4 minutes.   Disposition/Need for in-Hospital Stay- patient unable to be discharged at this time due to --- cellulitis and sepsis requiring IV antibiotics and chest pain and congestive heart failure requiring IV heparin  Status is: Inpatient  Remains inpatient appropriate because:cellulitis and sepsis requiring IV antibiotics and chest pain and congestive heart failure requiring IV heparin   Disposition: The patient is from: Home              Anticipated d/c is to: Home              Anticipated d/c date is: 2 days              Patient currently is not medically stable to d/c. Barriers: Not Clinically Stable- -cellulitis and sepsis requiring IV antibiotics and chest pain and  congestive heart failure requiring IV heparin   Code Status : Full  Family Communication:    (patient is alert, awake and coherent)   Consults  :  cardiology  DVT Prophylaxis  : IV heparin  Lab Results  Component Value Date   PLT 259 07/20/2020    Inpatient Medications  Scheduled Meds: . atorvastatin  80 mg Oral q1800  . buPROPion  150 mg Oral BID  . carvedilol  3.125 mg Oral BID  . Chlorhexidine Gluconate Cloth  6 each Topical Daily  . clopidogrel  75 mg Oral Daily  . cyanocobalamin  1,000 mcg Intramuscular Once  . doxycycline  100 mg Oral BID  . escitalopram  10 mg Oral Daily  . fenofibrate  54 mg Oral Daily  . [START ON 07/21/2020] fluconazole  100 mg Oral Q Mon  . insulin aspart  0-15 Units Subcutaneous TID WC  . insulin aspart  0-5 Units Subcutaneous QHS  . insulin glargine  40 Units Subcutaneous BID  . pantoprazole  40 mg Oral Daily  . pregabalin  150 mg Oral TID   Continuous  Infusions: . cefTRIAXone (ROCEPHIN)  IV 2 g (07/20/20 0849)  . heparin 2,500 Units/hr (07/20/20 1512)   PRN Meds:.albuterol, ALPRAZolam, fluticasone, oxyCODONE    Anti-infectives (From admission, onward)   Start     Dose/Rate Route Frequency Ordered Stop   07/21/20 1000  fluconazole (DIFLUCAN) tablet 100 mg     Discontinue     100 mg Oral Every Mon 07/18/20 1833     07/18/20 2200  doxycycline (VIBRA-TABS) tablet 100 mg     Discontinue     100 mg Oral 2 times daily 07/18/20 1833     07/18/20 1000  cefTRIAXone (ROCEPHIN) 2 g in sodium chloride 0.9 % 100 mL IVPB     Discontinue     2 g 200 mL/hr over 30 Minutes Intravenous Every 24 hours 07/18/20 0946          Objective:   Vitals:   07/19/20 2037 07/20/20 0116 07/20/20 0512 07/20/20 1507  BP: (!) 102/60 (!) 95/64 104/72 96/67  Pulse:  66 72 70  Resp:  _0 Temp:  97.7 F (36.5 C) (!) 97.5 F (36.4 C) 98.2 F (36.8 C)  TempSrc:  Oral Oral Oral  SpO2:  97% 100% 98%  Weight:      Height:        Wt Readings from Last 3  Encounters:  07/19/20 (!) 173.4 kg  11/12/19 (!) 175.5 kg  11/03/19 (!) 176.5 kg     Intake/Output Summary (Last 24 hours) at 07/20/2020 1731 Last data filed at 07/20/2020 1646 Gross per 24 hour  Intake 1146.79 ml  Output 600 ml  Net 546.79 ml   Physical Exam Gen:- Awake Alert, morbidly obese, speaking in short sentences HEENT:- [place].AT, No sclera icterus Neck-Supple Neck, .  Lungs-diminished breath sounds, faint bibasilar rales CV- S1, S2 normal, regular  Abd-  +ve B.Sounds, Abd Soft, No tenderness, increased truncal adiposity Extremity/Skin:-2+ pitting edema, pedal pulses are present Psych-affect is appropriate, oriented x3 Neuro-generalized weakness no new focal deficits, no tremors MSK/FEET-left fourth toe, left foot and left leg with improving left lower extremity erythema, improving warmth, improving tenderness  --Status post prior amputation of right big toe   Data Review:   Micro Results Recent Results (from the past 240 hour(s))  SARS Coronavirus 2 by RT PCR (hospital order, performed in [facility] hospital lab) Nasopharyngeal Nasopharyngeal Swab     Status: None   Collection Time: 07/18/20  2:27 AM   Specimen: Nasopharyngeal Swab  Result Value Ref Range Status   SARS Coronavirus 2 NEGATIVE NEGATIVE Final    Comment: (NOTE) SARS-CoV-2 target nucleic acids are NOT DETECTED.  The SARS-CoV-2 RNA is generally detectable in upper and lower respiratory specimens during the acute phase of infection. The lowest concentration of SARS-CoV-2 viral copies this assay can detect is 250 copies / mL. A negative result does not preclude SARS-CoV-2 infection and should not be used as the sole basis for treatment or other patient management decisions.  A negative result may occur with improper specimen collection / handling, submission of specimen other than nasopharyngeal swab, presence of viral mutation(s) within the areas targeted by this assay, and inadequate number of viral  copies (<250 copies / mL). A negative result must be combined with clinical observations, patient history, and epidemiological information.  Fact Sheet for Patients:   StrictlyIdeas.no  Fact Sheet for Healthcare Providers: BankingDealers.co.za  This test is not yet approved or  cleared by the [place] FDA and has been authorized for  detection and/or diagnosis of SARS-CoV-2 by FDA under an Emergency Use Authorization (EUA).  This EUA will remain in effect (meaning this test can be used) for the duration of the COVID-19 declaration under Section 564(b)(1) of the Act, 21 U.S.C. section 360bbb-3(b)(1), unless the authorization is terminated or revoked sooner.  Performed at [facility], [address] [place], [place] [address]   Culture, blood (routine x 2)     Status: None (Preliminary result)   Collection Time: 07/18/20 10:50 AM   Specimen: BLOOD RIGHT HAND  Result Value Ref Range Status   Specimen Description BLOOD RIGHT HAND  Final   Special Requests   Final    BOTTLES DRAWN AEROBIC AND ANAEROBIC Blood Culture results may not be optimal due to an inadequate volume of blood received in culture bottles   Culture   Final    NO GROWTH < 24 HOURS Performed at [facility], [address], [place], [place] [address]    Report Status PENDING  Incomplete  Culture, blood (routine x 2)     Status: None (Preliminary result)   Collection Time: 07/18/20 10:50 AM   Specimen: BLOOD LEFT HAND  Result Value Ref Range Status   Specimen Description BLOOD LEFT HAND  Final   Special Requests   Final    BOTTLES DRAWN AEROBIC AND ANAEROBIC Blood Culture adequate volume   Culture   Final    NO GROWTH < 24 HOURS Performed at [facility], [address], [place], [place] [address]    Report Status PENDING  Incomplete  MRSA PCR Screening     Status: None   Collection Time: 07/18/20  4:12 PM   Specimen: Nasal Mucosa; Nasopharyngeal  Result Value  Ref Range Status   MRSA by PCR NEGATIVE NEGATIVE Final    Comment:        The GeneXpert MRSA Assay (FDA approved for NASAL specimens only), is one component of a comprehensive MRSA colonization surveillance program. It is not intended to diagnose MRSA infection nor to guide or monitor treatment for MRSA infections. Performed at [facility], [address], [place], [place] [address]     Radiology Reports NM Pulmonary Perfusion  Result Date: 07/18/2020 CLINICAL DATA:  Shortness of breath EXAM: NUCLEAR MEDICINE PERFUSION LUNG SCAN TECHNIQUE: Perfusion images were obtained in multiple projections after intravenous injection of radiopharmaceutical. Views: Anterior, posterior, left lateral, right lateral, RPO, LPO, RAO, LAO RADIOPHARMACEUTICALS:  4.4 mCi Tc-64mMAA IV COMPARISON:  Chest radiograph July 18, 2020 FINDINGS: Radiotracer uptake is homogeneous and symmetric bilaterally. No perfusion defects evident. IMPRESSION: No perfusion defects evident. No findings indicative of pulmonary embolus. Electronically Signed   By: WLowella GripIII M.D.   On: 07/18/2020 12:07   UKoreaVenous Img Lower Unilateral Left (DVT)  Result Date: 07/18/2020 CLINICAL DATA:  LEFT leg swelling, pain, and redness for 6 weeks EXAM: LEFT LOWER EXTREMITY VENOUS DOPPLER ULTRASOUND TECHNIQUE: Gray-scale sonography with compression, as well as color and duplex ultrasound, were performed to evaluate the deep venous system(s) from the level of the common femoral vein through the popliteal and proximal calf veins. COMPARISON:  11/29/2007 FINDINGS: VENOUS Normal compressibility of the common femoral, superficial femoral, and popliteal veins, as well as the visualized calf veins. Visualized portions of profunda femoral vein and great saphenous vein unremarkable. No filling defects to suggest DVT on grayscale or color Doppler imaging. Doppler waveforms show normal direction of venous flow, normal respiratory plasticity and response  to augmentation. Limited views of the contralateral common  femoral vein are unremarkable. OTHER None. Limitations: none IMPRESSION: No evidence of deep venous thrombosis in the LEFT lower extremity. Electronically Signed   By: Lavonia Dana M.D.   On: 07/18/2020 13:38   DG Chest Port 1 View  Result Date: 07/18/2020 CLINICAL DATA:  Weakness and left-sided chest pain for 1 day EXAM: PORTABLE CHEST 1 VIEW COMPARISON:  11/01/2019 FINDINGS: Cardiac shadow is stable. Lungs are well aerated bilaterally. Mild interstitial changes are noted. No significant edema is noted. No focal infiltrate is seen. No bony abnormality is noted. IMPRESSION: Mild interstitial changes likely related to bronchitis Electronically Signed   By: Inez Catalina M.D.   On: 07/18/2020 01:02   ECHOCARDIOGRAM COMPLETE  Result Date: 07/18/2020    ECHOCARDIOGRAM REPORT   Patient Name:   [facility] Date of Exam: 07/18/2020 Medical Rec #:  [id number]       Height:       77.0 in Accession #:    [id number]      Weight:       350.0 lb Date of Birth:  15-Dec-1971       BSA:          2.837 m Patient Age:    56 years        BP:           109/78 mmHg Patient Gender: M               HR:           83 bpm. Exam Location:  [facility] Procedure: 2D Echo and Intracardiac Opacification Agent Indications:    CHF-Acute Diastolic 248.25 / O03.70  History:        Patient has prior history of Echocardiogram examinations, most                 recent 11/02/2019. CAD, COPD; Risk Factors:Current Smoker,                 Diabetes and Dyslipidemia. Elevated Troponin, GERD, Acute Kidney                 Injury, History of Pulmonary Embolus.  Sonographer:    Leavy Cella RDCS (AE) Referring Phys: [id number] OLADAPO ADEFESO IMPRESSIONS  1. Left ventricular ejection fraction, by estimation, is 50 to 55%. The left ventricle has low normal function. Left ventricular endocardial border not optimally defined to evaluate regional wall motion. There is mild left ventricular hypertrophy.  Left ventricular diastolic parameters were normal.  2. Right ventricular systolic function is normal. The right ventricular size is normal. Tricuspid regurgitation signal is inadequate for assessing PA pressure.  3. The mitral valve is grossly normal. No evidence of mitral valve regurgitation.  4. The aortic valve was not well visualized. Aortic valve regurgitation is not visualized.  5. The inferior vena cava is normal in size with greater than 50% respiratory variability, suggesting right atrial pressure of 3 mmHg. FINDINGS  Left Ventricle: Left ventricular ejection fraction, by estimation, is 50 to 55%. The left ventricle has low normal function. Left ventricular endocardial border not optimally defined to evaluate regional wall motion. Definity contrast agent was given IV  to delineate the left ventricular endocardial borders. The left ventricular internal cavity size was normal in size. There is mild left ventricular hypertrophy. Left ventricular diastolic parameters were normal. Right Ventricle: The right ventricular size is normal. No increase in right ventricular wall thickness. Right ventricular systolic function is normal. Tricuspid regurgitation signal is inadequate for assessing PA  pressure. Left Atrium: Left atrial size was normal in size. Right Atrium: Right atrial size was normal in size. Pericardium: There is no evidence of pericardial effusion. Mitral Valve: The mitral valve is grossly normal. No evidence of mitral valve regurgitation. Tricuspid Valve: The tricuspid valve is not well visualized. Tricuspid valve regurgitation is trivial. Aortic Valve: The aortic valve was not well visualized. Aortic valve regurgitation is not visualized. Pulmonic Valve: The pulmonic valve was not well visualized. Pulmonic valve regurgitation is not visualized. Aorta: The aortic root is normal in size and structure. Venous: The inferior vena cava is normal in size with greater than 50% respiratory variability,  suggesting right atrial pressure of 3 mmHg. IAS/Shunts: No atrial level shunt detected by color flow Doppler.  LEFT VENTRICLE PLAX 2D LVIDd:         4.76 cm  Diastology LVIDs:         4.09 cm  LV e' lateral:   10.80 cm/s LV PW:         1.27 cm  LV E/e' lateral: 9.8 LV IVS:        1.15 cm  LV e' medial:    7.07 cm/s LVOT diam:     2.20 cm  LV E/e' medial:  15.0 LVOT Area:     3.80 cm  RIGHT VENTRICLE RV S prime:     9.79 cm/s TAPSE (M-mode): 2.3 cm LEFT ATRIUM             Index       RIGHT ATRIUM          Index LA diam:        3.60 cm 1.27 cm/m  RA Area:     8.31 cm LA Vol (A2C):   79.9 ml 28.16 ml/m RA Volume:   17.40 ml 6.13 ml/m LA Vol (A4C):   62.8 ml 22.13 ml/m LA Biplane Vol: 74.9 ml 26.40 ml/m   AORTA Ao Root diam: 3.60 cm MITRAL VALVE MV Area (PHT): 2.62 cm     SHUNTS MV Decel Time: 289 msec     Systemic Diam: 2.20 cm MV E velocity: 106.00 cm/s MV A velocity: 104.00 cm/s MV E/A ratio:  1.02 Rozann Lesches MD Electronically signed by Rozann Lesches MD Signature Date/Time: 07/18/2020/4:41:30 PM    Final      CBC Recent Labs  Lab 07/18/20 0100 07/18/20 0243 07/19/20 0459 07/20/20 0624  WBC 13.2* 13.8* 8.7 8.8  HGB 10.0* 10.4* 10.1* 9.8*  HCT 31.9* 33.8* 31.8* 32.4*  PLT 204 216 237 259  MCV 94.4 97.4 93.3 93.9  MCH 29.6 30.0 29.6 28.4  MCHC 31.3 30.8 31.8 30.2  RDW 14.0 14.0 13.6 13.6  LYMPHSABS 1.7  --   --   --   MONOABS 1.1*  --   --   --   EOSABS 0.1  --   --   --   BASOSABS 0.0  --   --   --     Chemistries  Recent Labs  Lab 07/18/20 0100 07/19/20 0459 07/20/20 0624  NA 134* 138 139  K 5.3* 5.0 5.5*  CL 107 109 108  CO2 20* 21* 22  GLUCOSE 281* 259* 311*  BUN 59* 53* 45*  CREATININE 3.53* 2.26* 1.86*  CALCIUM 7.7* 8.2* 8.6*  MG 1.4*  --   --   AST  --  13*  --   ALT  --  15  --   ALKPHOS  --  48  --   BILITOT  --  0.4  --    ------------------------------------------------------------------------------------------------------------------ Recent Labs     07/19/20 0459  CHOL 158  HDL 20*  LDLCALC 101*  TRIG 187*  CHOLHDL 7.9    Lab Results  Component Value Date   HGBA1C 9.4 (H) 07/18/2020   ------------------------------------------------------------------------------------------------------------------ No results for input(s): TSH, T4TOTAL, T3FREE, THYROIDAB in the last 72 hours.  Invalid input(s): FREET3 ------------------------------------------------------------------------------------------------------------------ Recent Labs    07/20/20 0624  VITAMINB12 150*  FOLATE 5.4*  FERRITIN 250  TIBC 316  IRON 42*    Coagulation profile Recent Labs  Lab 07/19/20 0459  INR 1.3*    No results for input(s): DDIMER in the last 72 hours.  Cardiac Enzymes No results for input(s): CKMB, TROPONINI, MYOGLOBIN in the last 168 hours.  Invalid input(s): CK ------------------------------------------------------------------------------------------------------------------    Component Value Date/Time   BNP 199.0 (H) 07/18/2020 0100     Roxan Hockey M.D on 07/20/2020 at 5:31 PM  Go to [email or web address] - for contact info  [facility] - Office  [phone]

## 2020-07-20 NOTE — Progress Notes (Signed)
ANTICOAGULATION CONSULT NOTE   Pharmacy Consult for IV Heparin Indication: chest pain/ACS  Allergies  Allergen Reactions  . Sulfa Antibiotics Other (See Comments)    headache    Patient Measurements: Height: 6\' 5"  (195.6 cm) Weight: (!) 173.4 kg (382 lb 4.4 oz) IBW/kg (Calculated) : 89.1 Heparin Dosing Weight: 125 kg  Vital Signs: Temp: 98.5 F (36.9 C) (08/01 2105) Temp Source: Oral (08/01 2105) BP: 122/74 (08/01 2105) Pulse Rate: 71 (08/01 2105)  Labs: Recent Labs    07/18/20 0100 07/18/20 0100 07/18/20 0243 07/18/20 1050 07/19/20 0459 07/19/20 0459 07/19/20 1551 07/20/20 0624 07/20/20 1239 07/20/20 2208  HGB 10.0*   < > 10.4*  --  10.1*  --   --  9.8*  --   --   HCT 31.9*   < > 33.8*  --  31.8*  --   --  32.4*  --   --   PLT 204   < > 216  --  237  --   --  259  --   --   APTT 70*  --   --    < > 57*   < > 57* 72* 61*  --   LABPROT  --   --   --   --  16.0*  --   --   --   --   --   INR  --   --   --   --  1.3*  --   --   --   --   --   HEPARINUNFRC 2.00*  --   --    < >  --   --   --  0.33 0.25* 0.35  CREATININE 3.53*  --   --   --  2.26*  --   --  1.86*  --   --   TROPONINIHS 553*  --  528*  --  389*  --   --   --   --   --    < > = values in this interval not displayed.    Estimated Creatinine Clearance: 85.3 mL/min (A) (by C-G formula based on SCr of 1.86 mg/dL (H)).   Medical History: Past Medical History:  Diagnosis Date  . Anxiety   . Arthritis    "knees, shoulders, hips, ankles" (07/29/2016)  . Asthma   . CAD (coronary artery disease)    a. 2017: s/p BMS to distal Cx; b. LHC 05/30/2019: 80% mid, distal RCA s/p DES, 30% narrowing of d LM, widely patent LAD w/ luminal irregularities, widely patent stent in dCX  w/ 90+% stenosis distal to stent beofre small trifurcating obtuse marginal (potentially area of restenosis)  . Chronic bronchitis (HCC)   . Chronic ulcer of right great toe (HCC) 09/19/2017  . COPD (chronic obstructive pulmonary disease)  (HCC)   . Depression   . DVT (deep venous thrombosis) (HCC)   . GERD (gastroesophageal reflux disease)   . Hyperlipidemia   . Hypertension   . Myocardial infarction (HCC) 1996   "light one"  . PE (pulmonary embolism) 04/2013   On chronic Xarelto  . Peripheral nerve disease   . Pneumonia "several times"  . Type II diabetes mellitus (HCC)     Medications:  Scheduled:  . atorvastatin  80 mg Oral q1800  . buPROPion  150 mg Oral BID  . carvedilol  3.125 mg Oral BID  . Chlorhexidine Gluconate Cloth  6 each Topical Daily  . clopidogrel  75 mg Oral Daily  .  doxycycline  100 mg Oral BID  . escitalopram  10 mg Oral Daily  . fenofibrate  54 mg Oral Daily  . [START ON 07/21/2020] ferrous sulfate  325 mg Oral Q breakfast  . [START ON 07/21/2020] fluconazole  100 mg Oral Q Mon  . folic acid  1 mg Oral Daily  . insulin aspart  0-15 Units Subcutaneous TID WC  . insulin aspart  0-5 Units Subcutaneous QHS  . insulin glargine  40 Units Subcutaneous BID  . pantoprazole  40 mg Oral Daily  . pregabalin  150 mg Oral TID  . senna-docusate  2 tablet Oral BID   Infusions:  . cefTRIAXone (ROCEPHIN)  IV 2 g (07/20/20 0849)  . heparin 2,500 Units/hr (07/20/20 1512)    Assessment: 49 yo male presented with CP to start IV heparin for ACS. Baseline labs already drawn - SCr elevated at 3.53. Will need baseline aPTT and heparin level. Note that patient takes Xarelto for hx DVT/PE. Per RN, patient informed her that the last time he took his Xarelto 20mg  was 7/29 at 1130am.   8/1 PM update:  Heparin level therapeutic after rate increase  Goal of Therapy:  Heparin level 0.3-0.7 units/mL Monitor platelets by anticoagulation protocol: Yes   Plan:   Cont IV heparin at 2500 units/hr   Confirmatory heparin level with AM labs  Daily CBC  Monitor for S/S of bleeding  8/29, PharmD, BCPS Clinical Pharmacist Phone: [phone]

## 2020-07-20 NOTE — Progress Notes (Signed)
ANTICOAGULATION CONSULT NOTE -   Pharmacy Consult for IV heparin Indication: chest pain/ACS  Allergies  Allergen Reactions  . Sulfa Antibiotics Other (See Comments)    headache    Patient Measurements: Height: 6\' 5"  (195.6 cm) Weight: (!) 173.4 kg (382 lb 4.4 oz) IBW/kg (Calculated) : 89.1 Heparin Dosing Weight: 125 kg  Vital Signs: Temp: 97.5 F (36.4 C) (08/01 0512) Temp Source: Oral (08/01 0512) BP: 104/72 (08/01 0512) Pulse Rate: 72 (08/01 0512)  Labs: Recent Labs    07/18/20 0100 07/18/20 0100 07/18/20 0243 07/18/20 1050 07/18/20 1946 07/19/20 0459 07/19/20 1551 07/20/20 0624 07/20/20 1239  HGB 10.0*   < > 10.4*  --   --  10.1*  --  9.8*  --   HCT 31.9*   < > 33.8*  --   --  31.8*  --  32.4*  --   PLT 204   < > 216  --   --  237  --  259  --   APTT 70*   < >  --  52*   < > 57* 57* 72*  --   LABPROT  --   --   --   --   --  16.0*  --   --   --   INR  --   --   --   --   --  1.3*  --   --   --   HEPARINUNFRC 2.00*   < >  --  >2.20*  --   --   --  0.33 0.25*  CREATININE 3.53*  --   --   --   --  2.26*  --  1.86*  --   TROPONINIHS 553*  --  528*  --   --  389*  --   --   --    < > = values in this interval not displayed.    Estimated Creatinine Clearance: 85.3 mL/min (A) (by C-G formula based on SCr of 1.86 mg/dL (H)).   Medical History: Past Medical History:  Diagnosis Date  . Anxiety   . Arthritis    "knees, shoulders, hips, ankles" (07/29/2016)  . Asthma   . CAD (coronary artery disease)    a. 2017: s/p BMS to distal Cx; b. LHC 05/30/2019: 80% mid, distal RCA s/p DES, 30% narrowing of d LM, widely patent LAD w/ luminal irregularities, widely patent stent in dCX  w/ 90+% stenosis distal to stent beofre small trifurcating obtuse marginal (potentially area of restenosis)  . Chronic bronchitis (HCC)   . Chronic ulcer of right great toe (HCC) 09/19/2017  . COPD (chronic obstructive pulmonary disease) (HCC)   . Depression   . DVT (deep venous thrombosis)  (HCC)   . GERD (gastroesophageal reflux disease)   . Hyperlipidemia   . Hypertension   . Myocardial infarction (HCC) 1996   "light one"  . PE (pulmonary embolism) 04/2013   On chronic Xarelto  . Peripheral nerve disease   . Pneumonia "several times"  . Type II diabetes mellitus (HCC)     Medications:  Scheduled:  . atorvastatin  80 mg Oral q1800  . buPROPion  150 mg Oral BID  . carvedilol  3.125 mg Oral BID  . Chlorhexidine Gluconate Cloth  6 each Topical Daily  . clopidogrel  75 mg Oral Daily  . doxycycline  100 mg Oral BID  . escitalopram  10 mg Oral Daily  . fenofibrate  54 mg Oral Daily  . [START ON  07/21/2020] fluconazole  100 mg Oral Q Mon  . insulin aspart  0-15 Units Subcutaneous TID WC  . insulin aspart  0-5 Units Subcutaneous QHS  . insulin glargine  40 Units Subcutaneous BID  . pantoprazole  40 mg Oral Daily  . pregabalin  150 mg Oral TID   Infusions:  . cefTRIAXone (ROCEPHIN)  IV 2 g (07/20/20 0849)  . heparin 2,250 Units/hr (07/19/20 2018)    Assessment: 49 yo male presented with CP to start IV heparin for ACS. Baseline labs already drawn - SCr elevated at 3.53. Will need baseline aPTT and heparin level. Note that patient takes Xarelto for hx DVT/PE. Per RN, patient informed her that the last time he took his Xarelto 20mg  was 7/29 at 1130am.   APTT and HL correlating. HL this afternoon is subtherapeutic at 0.25  Goal of Therapy:  Heparin level 0.3-0.7 units/ml aPTT 66-102 seconds Monitor platelets by anticoagulation protocol: Yes   Plan:   Increase IV heparin to rate of 2500 units/hr   Check HL in ~6-8 hours  Daily heparin level   Daily CBC  Monitor for S/S of bleeding  8/29, BS Joseph [name], BCPS Clinical Pharmacist Pager [phone] 07/20/2020,1:49 PM

## 2020-07-21 DIAGNOSIS — R0789 Other chest pain: Secondary | ICD-10-CM

## 2020-07-21 LAB — CBC
HCT: 32.3 % — ABNORMAL LOW (ref 39.0–52.0)
Hemoglobin: 9.7 g/dL — ABNORMAL LOW (ref 13.0–17.0)
MCH: 28.3 pg (ref 26.0–34.0)
MCHC: 30 g/dL (ref 30.0–36.0)
MCV: 94.2 fL (ref 80.0–100.0)
Platelets: 279 10*3/uL (ref 150–400)
RBC: 3.43 MIL/uL — ABNORMAL LOW (ref 4.22–5.81)
RDW: 13.5 % (ref 11.5–15.5)
WBC: 8.7 10*3/uL (ref 4.0–10.5)
nRBC: 0 % (ref 0.0–0.2)

## 2020-07-21 LAB — GLUCOSE, CAPILLARY
Glucose-Capillary: 195 mg/dL — ABNORMAL HIGH (ref 70–99)
Glucose-Capillary: 198 mg/dL — ABNORMAL HIGH (ref 70–99)
Glucose-Capillary: 165 mg/dL — ABNORMAL HIGH (ref 70–99)
Glucose-Capillary: 246 mg/dL — ABNORMAL HIGH (ref 70–99)

## 2020-07-21 LAB — RENAL FUNCTION PANEL
Albumin: 3 g/dL — ABNORMAL LOW (ref 3.5–5.0)
Anion gap: 7 (ref 5–15)
BUN: 36 mg/dL — ABNORMAL HIGH (ref 6–20)
CO2: 24 mmol/L (ref 22–32)
Calcium: 8.7 mg/dL — ABNORMAL LOW (ref 8.9–10.3)
Chloride: 110 mmol/L (ref 98–111)
Creatinine, Ser: 1.61 mg/dL — ABNORMAL HIGH (ref 0.61–1.24)
GFR calc Af Amer: 58 mL/min — ABNORMAL LOW (ref >60)
GFR calc non Af Amer: 50 mL/min — ABNORMAL LOW (ref >60)
Glucose, Bld: 222 mg/dL — ABNORMAL HIGH (ref 70–99)
Phosphorus: 2.2 mg/dL — ABNORMAL LOW (ref 2.5–4.6)
Potassium: 4.8 mmol/L (ref 3.5–5.1)
Sodium: 141 mmol/L (ref 135–145)

## 2020-07-21 LAB — HEPARIN LEVEL (UNFRACTIONATED)
Heparin Unfractionated: 0.24 IU/mL — ABNORMAL LOW (ref 0.30–0.70)
Heparin Unfractionated: 0.36 IU/mL (ref 0.30–0.70)
Heparin Unfractionated: 0.46 IU/mL (ref 0.30–0.70)

## 2020-07-21 MED ORDER — ASPIRIN EC 81 MG PO TBEC
81.0000 mg | DELAYED_RELEASE_TABLET | Freq: Every day | ORAL | Status: DC
  Administered 2020-07-21 – 2020-07-23 (×3): 81 mg via ORAL
  Filled 2020-07-21 (×3): qty 1

## 2020-07-21 NOTE — H&P (View-Only) (Signed)
Progress Note  Patient Name: Joseph [name] Date of Encounter: 07/21/2020  [facility] Cardiologist: Chilton Si, MD   Subjective   Ongoing chest pains at times.   Inpatient Medications    Scheduled Meds: . atorvastatin  80 mg Oral q1800  . buPROPion  150 mg Oral BID  . carvedilol  3.125 mg Oral BID  . Chlorhexidine Gluconate Cloth  6 each Topical Daily  . clopidogrel  75 mg Oral Daily  . doxycycline  100 mg Oral BID  . escitalopram  10 mg Oral Daily  . fenofibrate  54 mg Oral Daily  . ferrous sulfate  325 mg Oral Q breakfast  . fluconazole  100 mg Oral Q Mon  . folic acid  1 mg Oral Daily  . insulin aspart  0-15 Units Subcutaneous TID WC  . insulin aspart  0-5 Units Subcutaneous QHS  . insulin glargine  40 Units Subcutaneous BID  . pantoprazole  40 mg Oral Daily  . pregabalin  150 mg Oral TID  . senna-docusate  2 tablet Oral BID   Continuous Infusions: . cefTRIAXone (ROCEPHIN)  IV 2 g (07/20/20 0849)  . heparin 2,500 Units/hr (07/21/20 0144)   PRN Meds: albuterol, ALPRAZolam, fluticasone, oxyCODONE   Vital Signs    Vitals:   07/20/20 1507 07/20/20 2052 07/20/20 2105 07/21/20 0623  BP: 96/67  122/74 112/78  Pulse: 70  71 (!) 106  Resp: 16  20 20   Temp: 98.2 F (36.8 C)  98.5 F (36.9 C) 98.9 F (37.2 C)  TempSrc: Oral  Oral Oral  SpO2: 98% 95% 97% 90%  Weight:    (!) 170.4 kg  Height:        Intake/Output Summary (Last 24 hours) at 07/21/2020 0834 Last data filed at 07/20/2020 1646 Gross per 24 hour  Intake 244.84 ml  Output --  Net 244.84 ml   Last 3 Weights 07/21/2020 07/19/2020 07/18/2020  Weight (lbs) 375 lb 10.6 oz 382 lb 4.4 oz 382 lb 8 oz  Weight (kg) 170.4 kg 173.4 kg 173.5 kg      Telemetry    SR - Personally Reviewed  ECG    n/a - Personally Reviewed  Physical Exam   GEN: No acute distress.   Neck: No JVD Cardiac: RRR, no murmurs, rubs, or gallops.  Respiratory: Clear to auscultation bilaterally. GI: Soft, nontender,  non-distended  MS: No edema; No deformity. Neuro:  Nonfocal  Psych: Normal affect   Labs    High Sensitivity Troponin:   Recent Labs  Lab 07/18/20 0100 07/18/20 0243 07/19/20 0459  TROPONINIHS 553* 528* 389*      Chemistry Recent Labs  Lab 07/19/20 0459 07/20/20 0624 07/21/20 0557  NA 138 139 141  K 5.0 5.5* 4.8  CL 109 108 110  CO2 21* 22 24  GLUCOSE 259* 311* 222*  BUN 53* 45* 36*  CREATININE 2.26* 1.86* 1.61*  CALCIUM 8.2* 8.6* 8.7*  PROT 6.6  --   --   ALBUMIN 3.0*  --  3.0*  AST 13*  --   --   ALT 15  --   --   ALKPHOS 48  --   --   BILITOT 0.4  --   --   GFRNONAA 33* 42* 50*  GFRAA 39* 49* 58*  ANIONGAP 8 9 7      Hematology Recent Labs  Lab 07/19/20 0459 07/20/20 0624 07/21/20 0557  WBC 8.7 8.8 8.7  RBC 3.41* 3.45* 3.43*  HGB 10.1* 9.8* 9.7*  HCT 31.8* 32.4* 32.3*  MCV 93.3 93.9 94.2  MCH 29.6 28.4 28.3  MCHC 31.8 30.2 30.0  RDW 13.6 13.6 13.5  PLT 237 259 279    BNP Recent Labs  Lab 07/18/20 0100  BNP 199.0*     DDimer No results for input(s): DDIMER in the last 168 hours.   Radiology    No results found.  Cardiac Studies     Patient Profile     Joseph [name] is a 49 y.o. male with past medical history of CAD (s/p BMS to LCx in 2017, DES to distal RCA in 05/2019, repeat cath in 10/2019 with DES to distal LCx with patent stents along RCA and mid-LCx), history of DVT/PE (on Xarelto), HTN, HLD and tobacco use who is being seen today for the evaluation of chest pain and elevated troponin values at the request of Dr. Adefeso.  Assessment & Plan    1. CAD - past medical history of CAD (s/p BMS to LCx in 2017, DES to distal RCA in 05/2019, repeat cath in 10/2019 with DES to distal LCx with patent stents along RCA and mid-LCx)  - admitted with chest pain, atypical -peak trop 553 trending down, EKG without acute ischemic changes - 06/2020 echo LVEF 50-55%, poorly visualized endocardium for wall motion  - trop thought to be due to  demand ischemia in setting of sepsis and hypotension on admission. WIth mangagement and improvement of sepsis however chest pains continue  - he describes intermittent pressing/sharp pain midchest radiating up into left should and neck. +SOB +dizziness. Can occur at any time. He states has been going on the last few months but was underreporting his symptoms to avoid hospitlization or further workup. Over the last few days significant increase in severity of symptoms. Pain is similar to his pain at times of his prior stents  - suspect potential new obstructive CAD, perhaps increased demand from recent sepsis led to progression of his symptoms.  - body habitus and pretest probability lower utlitity of noninvasive ischemic evaluation - will plan for cath now that renal function has improved.   - medical therapy with atorva 80, coreg 3.125mg bid, plavix 75, hep gtt. ACEI on hold due to AKI - since on xarelto at home for prior DVT/PE had been on xarelto and plavix, not aspirin. Restart ASA at this time pending cath with xarelto on hold  2. Severe sepsis - secondary to cellulitis - abx per primary team   3. AKI - admit Cr 3.5 in setting of sepsis, down to 1.6 today  4. History of DVT/PE - DOAC on hold, currently on heparin gtt for potential invasive procedures.    5. Hypotension - presented hyoptensive with sepsis - bp's have normalized.    For questions or updates, please contact [facility] Please consult [email or web address] for contact info under        Signed, Gregory Barrick, MD  07/21/2020, 8:34 AM    

## 2020-07-21 NOTE — Progress Notes (Signed)
Patient Demographics:    Joseph [name], is a 49 y.o. male, DOB - 27-Jul-1971, FXJ:[id number]  Admit date - 07/17/2020   Admitting Physician Maddeline Roorda Denton Brick, MD  Outpatient Primary MD for the patient is [name], NP  LOS - 2   Chief Complaint  Patient presents with  . Chest Pain        Subjective:    Joseph [name] today has no fevers, no emesis,   -Intermittent chest pains  --some dyspnea on exertion persist -Left leg swelling the pain is improved significantly   Assessment  & Plan :    Principal Problem:   Severe sepsis --Severe Sepsis secondary to Lt leg/Foot Cellulitis with hypotension and AKI Active Problems:   AKI (acute kidney injury) ([place])   CAD -S/P PCI 11/02/19    Severe Sepsis secondary to Lt leg/Foot Cellulitis   Dyslipidemia   History of pulmonary embolus (PE)   OSA (obstructive sleep apnea)   Insulin dependent type 2 diabetes mellitus (HCC)   Hyperglycemia   COPD (chronic obstructive pulmonary disease) (HCC)   Obesity, Class III, BMI 40-49.9 (morbid obesity) (HCC)   Leukocytosis   Hypotension   Left leg swelling   Elevated troponin   Elevated brain natriuretic peptide (BNP) level   GERD (gastroesophageal reflux disease)    Brief Summary:- 49 y.o.malewith past medical history of morbid obesity/OSA, history of CAD (s/p BMS to LCx in 2017, DES to distal RCA in 05/2019, repeat cath in 10/2019 with DES to distal LCx with patent stents along RCA and mid-LCx), history of DVT/PE (on Xarelto), HTN, HLD and tobacco use admitted on 07/18/2020 with atypical chest pains, troponin elevation, concerns about CHF and sepsis secondary to left lower extremity cellulitis with hypotension and AKI -Currently on IV heparin with no bleeding concerns   A/p 1)Severe sepsis secondary to left lower extremity cellulitis--- treat empirically with IV Rocephin and doxycycline pending  cultures -Patient met sepsis criteria on admission with leukocytosis, tachycardia, tachypnea with concomitant persistent hypotension and AKI --Tachycardia may not be as prominent  due to Coreg use PTA -Initial lactic acid was not elevated -Sepsis will explain patient's hypotension and AKI -Bps improved, decrease Coreg to 3.125 twice daily from 12.5 mg twice daily, discontinued lisinopril HCTZ due to soft BP and AKI -Sepsis pathophysiology has resolved   --WBC is down to 8.7 from 13.8 -Blood cultures from 07/18/2020 NGTD   2)AKI----acute kidney injury due to persistent hypotension in the setting of severe sepsis as above #1 compounded by lisinopril/HCTZ use --   creatinine on admission= 3.53 , baseline creatinine =1.1 to 1.2 -Creatinine has improved to 1.61  -- renally adjust medications, avoid nephrotoxic agents / dehydration  / hypotension -Stopped Metformin -Stopped lisinopril and HCTZ  3)Left lower extremity cellulitis----please see photos in epic, treat as above #1 - --overall left lower extremity cellulitis has improved significantly -Lower extremity  venous Dopplers negative for DVT  4)Morbid Obesity AND OSA -Low calorie diet, portion control and increase physical activity discussed with patient -CPAP use advised -Body mass index is 45.36 kg/m.  -PTA patient was not compliant with CPAP machine due to faulty machine, he will need to follow-up with pulmonologist post discharge for repeat sleep study so he can get a new CPAP machine  5)Atypical chest pains AND h/o CAD--prior angioplasty and stents--- cardiology consult appreciated,  ---echo from 07/18/2020 with EF of 50 to 55%,  -Elevated troponin noted [phone] -EKG not consistent with ACS -Continue IV heparin (continue to hold Xarelto to allow for possible interventions ) -Coreg adjusted as above #1 due to soft BP -Plavix as ordered, Lipitor as ordered LDL 101, HDL 20 --Continues to have intermittent chest discomfort  AND DOE  6)H/o Prior DVT/PE-lower extremity venous Dopplers negative for DVT, -VQ scan low probability for PE -Hold Xarelto, currently on IV heparin as above in #5  7)Chronic pain syndrome--- continue home opiate regimen  8)Acute Anemia--etiology unclear-appears to have both iron deficiency and folate as well as B12 deficiency --, hemoglobin was 13 on 11/03/2019 -Hemoglobin is down to 9.7 -Check FOBT,  anemia work-up reveals B12 deficiency (b12 low at 150), as well as folate deficiency (low at 5.4) and iron deficiency (low at 42 with iron saturation of 13) -Give monthly Z61 shots -Give folic tablets -Iron supplementation -No evidence of ongoing/obvious bleeding at this time  9)DM2-A1c is 9.4, Lantus insulin as well as sliding scale insulin as ordered  10)Tobacco abuse-  Smoking cessation advised  Disposition/Need for in-Hospital Stay- patient unable to be discharged at this time due to --- cellulitis and sepsis requiring IV antibiotics and chest pain and congestive heart failure requiring IV heparin- ----Transfer to [place] for [facility] on 07/22/2020   Status is: Inpatient  Remains inpatient appropriate because:cellulitis and sepsis requiring IV antibiotics and chest pain and congestive heart failure requiring IV heparin   Disposition: The patient is from: Home              Anticipated d/c is to: Home              Anticipated d/c date is: 2 days              Patient currently is not medically stable to d/c. Barriers: Not Clinically Stable- -cellulitis and sepsis requiring IV antibiotics and chest pain and congestive heart failure requiring IV heparin- ----Transfer to [place] for [facility] on 07/22/2020    Code Status : Full  Family Communication:    (patient is alert, awake and coherent)   Consults  :  cardiology  DVT Prophylaxis  : IV heparin  Lab Results  Component Value Date   PLT 279 07/21/2020    Inpatient Medications  Scheduled Meds: . aspirin EC   81 mg Oral Daily  . atorvastatin  80 mg Oral q1800  . buPROPion  150 mg Oral BID  . carvedilol  3.125 mg Oral BID  . Chlorhexidine Gluconate Cloth  6 each Topical Daily  . clopidogrel  75 mg Oral Daily  . doxycycline  100 mg Oral BID  . escitalopram  10 mg Oral Daily  . fenofibrate  54 mg Oral Daily  . ferrous sulfate  325 mg Oral Q breakfast  . fluconazole  100 mg Oral Q Mon  . folic acid  1 mg Oral Daily  . insulin aspart  0-15 Units Subcutaneous TID WC  . insulin aspart  0-5 Units Subcutaneous QHS  . insulin glargine  40 Units Subcutaneous BID  . pantoprazole  40 mg Oral Daily  . pregabalin  150 mg Oral TID  . senna-docusate  2 tablet Oral BID   Continuous Infusions: . cefTRIAXone (ROCEPHIN)  IV 2 g (07/21/20 0906)  . heparin 2,750 Units/hr (07/21/20 1238)   PRN Meds:.albuterol, ALPRAZolam, fluticasone, oxyCODONE  Anti-infectives (From admission, onward)   Start     Dose/Rate Route Frequency Ordered Stop   07/21/20 1000  fluconazole (DIFLUCAN) tablet 100 mg     Discontinue     100 mg Oral Every Mon 07/18/20 1833     07/18/20 2200  doxycycline (VIBRA-TABS) tablet 100 mg     Discontinue     100 mg Oral 2 times daily 07/18/20 1833     07/18/20 1000  cefTRIAXone (ROCEPHIN) 2 g in sodium chloride 0.9 % 100 mL IVPB     Discontinue     2 g 200 mL/hr over 30 Minutes Intravenous Every 24 hours 07/18/20 0946          Objective:   Vitals:   07/20/20 1507 07/20/20 2052 07/20/20 2105 07/21/20 0623  BP: 96/67  122/74 112/78  Pulse: 70  71 (!) 106  Resp: _0 Temp: 98.2 F (36.8 C)  98.5 F (36.9 C) 98.9 F (37.2 C)  TempSrc: Oral  Oral Oral  SpO2: 98% 95% 97% 90%  Weight:    (!) 170.4 kg  Height:       Wt Readings from Last 3 Encounters:  07/21/20 (!) 170.4 kg  11/12/19 (!) 175.5 kg  11/03/19 (!) 176.5 kg    Intake/Output Summary (Last 24 hours) at 07/21/2020 1931 Last data filed at 07/21/2020 1300 Gross per 24 hour  Intake 480 ml  Output --  Net 480 ml    Physical Exam Gen:- Awake Alert, morbidly obese, speaking in short sentences HEENT:- [place].AT, No sclera icterus Neck-Supple Neck, .  Lungs-diminished breath sounds, faint bibasilar rales CV- S1, S2 normal, regular  Abd-  +ve B.Sounds, Abd Soft, No tenderness, increased truncal adiposity Extremity/Skin:-2+ pitting edema, pedal pulses are present Psych-affect is appropriate, oriented x3 Neuro-generalized weakness no new focal deficits, no tremors MSK/FEET-overall much improved erythema, much improved swelling much improved warmth, no significant tenderness over left fourth toe, left foot and left leg and  left lower extremity  --Status post prior amputation of right big toe   Data Review:   Micro Results Recent Results (from the past 240 hour(s))  SARS Coronavirus 2 by RT PCR (hospital order, performed in [facility] hospital lab) Nasopharyngeal Nasopharyngeal Swab     Status: None   Collection Time: 07/18/20  2:27 AM   Specimen: Nasopharyngeal Swab  Result Value Ref Range Status   SARS Coronavirus 2 NEGATIVE NEGATIVE Final    Comment: (NOTE) SARS-CoV-2 target nucleic acids are NOT DETECTED.  The SARS-CoV-2 RNA is generally detectable in upper and lower respiratory specimens during the acute phase of infection. The lowest concentration of SARS-CoV-2 viral copies this assay can detect is 250 copies / mL. A negative result does not preclude SARS-CoV-2 infection and should not be used as the sole basis for treatment or other patient management decisions.  A negative result may occur with improper specimen collection / handling, submission of specimen other than nasopharyngeal swab, presence of viral mutation(s) within the areas targeted by this assay, and inadequate number of viral copies (<250 copies / mL). A negative result must be combined with clinical observations, patient history, and epidemiological information.  Fact Sheet for Patients:    StrictlyIdeas.no  Fact Sheet for Healthcare Providers: BankingDealers.co.za  This test is not yet approved or  cleared by the [place] FDA and has been authorized for detection and/or diagnosis of SARS-CoV-2 by FDA under an Emergency Use Authorization (EUA).  This EUA will remain in effect (meaning  this test can be used) for the duration of the COVID-19 declaration under Section 564(b)(1) of the Act, 21 U.S.C. section 360bbb-3(b)(1), unless the authorization is terminated or revoked sooner.  Performed at [facility], [address] [place], [place] [address]   Culture, blood (routine x 2)     Status: None (Preliminary result)   Collection Time: 07/18/20 10:50 AM   Specimen: BLOOD RIGHT HAND  Result Value Ref Range Status   Specimen Description BLOOD RIGHT HAND  Final   Special Requests   Final    BOTTLES DRAWN AEROBIC AND ANAEROBIC Blood Culture results may not be optimal due to an inadequate volume of blood received in culture bottles   Culture   Final    NO GROWTH 3 DAYS Performed at [facility], [address] [place], [place] [address]    Report Status PENDING  Incomplete  Culture, blood (routine x 2)     Status: None (Preliminary result)   Collection Time: 07/18/20 10:50 AM   Specimen: BLOOD LEFT HAND  Result Value Ref Range Status   Specimen Description BLOOD LEFT HAND  Final   Special Requests   Final    BOTTLES DRAWN AEROBIC AND ANAEROBIC Blood Culture adequate volume   Culture   Final    NO GROWTH 3 DAYS Performed at [facility], [address], [place], [place] [address]    Report Status PENDING  Incomplete  MRSA PCR Screening     Status: None   Collection Time: 07/18/20  4:12 PM   Specimen: Nasal Mucosa; Nasopharyngeal  Result Value Ref Range Status   MRSA by PCR NEGATIVE NEGATIVE Final    Comment:        The GeneXpert MRSA Assay (FDA approved for NASAL specimens only), is one component of  a comprehensive MRSA colonization surveillance program. It is not intended to diagnose MRSA infection nor to guide or monitor treatment for MRSA infections. Performed at [facility], [address], [place], [place] [address]    Radiology Reports NM Pulmonary Perfusion  Result Date: 07/18/2020 CLINICAL DATA:  Shortness of breath EXAM: NUCLEAR MEDICINE PERFUSION LUNG SCAN TECHNIQUE: Perfusion images were obtained in multiple projections after intravenous injection of radiopharmaceutical. Views: Anterior, posterior, left lateral, right lateral, RPO, LPO, RAO, LAO RADIOPHARMACEUTICALS:  4.4 mCi Tc-87mMAA IV COMPARISON:  Chest radiograph July 18, 2020 FINDINGS: Radiotracer uptake is homogeneous and symmetric bilaterally. No perfusion defects evident. IMPRESSION: No perfusion defects evident. No findings indicative of pulmonary embolus. Electronically Signed   By: WLowella GripIII M.D.   On: 07/18/2020 12:07   UKoreaVenous Img Lower Unilateral Left (DVT)  Result Date: 07/18/2020 CLINICAL DATA:  LEFT leg swelling, pain, and redness for 6 weeks EXAM: LEFT LOWER EXTREMITY VENOUS DOPPLER ULTRASOUND TECHNIQUE: Gray-scale sonography with compression, as well as color and duplex ultrasound, were performed to evaluate the deep venous system(s) from the level of the common femoral vein through the popliteal and proximal calf veins. COMPARISON:  11/29/2007 FINDINGS: VENOUS Normal compressibility of the common femoral, superficial femoral, and popliteal veins, as well as the visualized calf veins. Visualized portions of profunda femoral vein and great saphenous vein unremarkable. No filling defects to suggest DVT on grayscale or color Doppler imaging. Doppler waveforms show normal direction of venous flow, normal respiratory plasticity and response to augmentation. Limited views of the contralateral common femoral vein are unremarkable. OTHER None. Limitations: none IMPRESSION: No evidence of deep venous  thrombosis in the LEFT lower extremity. Electronically Signed  By: Lavonia Dana M.D.   On: 07/18/2020 13:38   DG Chest Port 1 View  Result Date: 07/18/2020 CLINICAL DATA:  Weakness and left-sided chest pain for 1 day EXAM: PORTABLE CHEST 1 VIEW COMPARISON:  11/01/2019 FINDINGS: Cardiac shadow is stable. Lungs are well aerated bilaterally. Mild interstitial changes are noted. No significant edema is noted. No focal infiltrate is seen. No bony abnormality is noted. IMPRESSION: Mild interstitial changes likely related to bronchitis Electronically Signed   By: Inez Catalina M.D.   On: 07/18/2020 01:02   ECHOCARDIOGRAM COMPLETE  Result Date: 07/18/2020    ECHOCARDIOGRAM REPORT   Patient Name:   FLYNT BREEZE [facility] Date of Exam: 07/18/2020 Medical Rec #:  [id number]       Height:       77.0 in Accession #:    [id number]      Weight:       350.0 lb Date of Birth:  29-Jun-1971       BSA:          2.837 m Patient Age:    63 years        BP:           109/78 mmHg Patient Gender: M               HR:           83 bpm. Exam Location:  [facility] Procedure: 2D Echo and Intracardiac Opacification Agent Indications:    CHF-Acute Diastolic 403.47 / Q25.95  History:        Patient has prior history of Echocardiogram examinations, most                 recent 11/02/2019. CAD, COPD; Risk Factors:Current Smoker,                 Diabetes and Dyslipidemia. Elevated Troponin, GERD, Acute Kidney                 Injury, History of Pulmonary Embolus.  Sonographer:    Leavy Cella RDCS (AE) Referring Phys: [id number] OLADAPO ADEFESO IMPRESSIONS  1. Left ventricular ejection fraction, by estimation, is 50 to 55%. The left ventricle has low normal function. Left ventricular endocardial border not optimally defined to evaluate regional wall motion. There is mild left ventricular hypertrophy. Left ventricular diastolic parameters were normal.  2. Right ventricular systolic function is normal. The right ventricular size is normal. Tricuspid  regurgitation signal is inadequate for assessing PA pressure.  3. The mitral valve is grossly normal. No evidence of mitral valve regurgitation.  4. The aortic valve was not well visualized. Aortic valve regurgitation is not visualized.  5. The inferior vena cava is normal in size with greater than 50% respiratory variability, suggesting right atrial pressure of 3 mmHg. FINDINGS  Left Ventricle: Left ventricular ejection fraction, by estimation, is 50 to 55%. The left ventricle has low normal function. Left ventricular endocardial border not optimally defined to evaluate regional wall motion. Definity contrast agent was given IV  to delineate the left ventricular endocardial borders. The left ventricular internal cavity size was normal in size. There is mild left ventricular hypertrophy. Left ventricular diastolic parameters were normal. Right Ventricle: The right ventricular size is normal. No increase in right ventricular wall thickness. Right ventricular systolic function is normal. Tricuspid regurgitation signal is inadequate for assessing PA pressure. Left Atrium: Left atrial size was normal in size. Right Atrium: Right atrial size was normal in size. Pericardium: There is no evidence  of pericardial effusion. Mitral Valve: The mitral valve is grossly normal. No evidence of mitral valve regurgitation. Tricuspid Valve: The tricuspid valve is not well visualized. Tricuspid valve regurgitation is trivial. Aortic Valve: The aortic valve was not well visualized. Aortic valve regurgitation is not visualized. Pulmonic Valve: The pulmonic valve was not well visualized. Pulmonic valve regurgitation is not visualized. Aorta: The aortic root is normal in size and structure. Venous: The inferior vena cava is normal in size with greater than 50% respiratory variability, suggesting right atrial pressure of 3 mmHg. IAS/Shunts: No atrial level shunt detected by color flow Doppler.  LEFT VENTRICLE PLAX 2D LVIDd:         4.76 cm   Diastology LVIDs:         4.09 cm  LV e' lateral:   10.80 cm/s LV PW:         1.27 cm  LV E/e' lateral: 9.8 LV IVS:        1.15 cm  LV e' medial:    7.07 cm/s LVOT diam:     2.20 cm  LV E/e' medial:  15.0 LVOT Area:     3.80 cm  RIGHT VENTRICLE RV S prime:     9.79 cm/s TAPSE (M-mode): 2.3 cm LEFT ATRIUM             Index       RIGHT ATRIUM          Index LA diam:        3.60 cm 1.27 cm/m  RA Area:     8.31 cm LA Vol (A2C):   79.9 ml 28.16 ml/m RA Volume:   17.40 ml 6.13 ml/m LA Vol (A4C):   62.8 ml 22.13 ml/m LA Biplane Vol: 74.9 ml 26.40 ml/m   AORTA Ao Root diam: 3.60 cm MITRAL VALVE MV Area (PHT): 2.62 cm     SHUNTS MV Decel Time: 289 msec     Systemic Diam: 2.20 cm MV E velocity: 106.00 cm/s MV A velocity: 104.00 cm/s MV E/A ratio:  1.02 Rozann Lesches MD Electronically signed by Rozann Lesches MD Signature Date/Time: 07/18/2020/4:41:30 PM    Final      CBC Recent Labs  Lab 07/18/20 0100 07/18/20 0243 07/19/20 0459 07/20/20 0624 07/21/20 0557  WBC 13.2* 13.8* 8.7 8.8 8.7  HGB 10.0* 10.4* 10.1* 9.8* 9.7*  HCT 31.9* 33.8* 31.8* 32.4* 32.3*  PLT 204 216 237 259 279  MCV 94.4 97.4 93.3 93.9 94.2  MCH 29.6 30.0 29.6 28.4 28.3  MCHC 31.3 30.8 31.8 30.2 30.0  RDW 14.0 14.0 13.6 13.6 13.5  LYMPHSABS 1.7  --   --   --   --   MONOABS 1.1*  --   --   --   --   EOSABS 0.1  --   --   --   --   BASOSABS 0.0  --   --   --   --     Chemistries  Recent Labs  Lab 07/18/20 0100 07/19/20 0459 07/20/20 0624 07/21/20 0557  NA 134* 138 139 141  K 5.3* 5.0 5.5* 4.8  CL 107 109 108 110  CO2 20* 21* 22 24  GLUCOSE 281* 259* 311* 222*  BUN 59* 53* 45* 36*  CREATININE 3.53* 2.26* 1.86* 1.61*  CALCIUM 7.7* 8.2* 8.6* 8.7*  MG 1.4*  --   --   --   AST  --  13*  --   --   ALT  --  15  --   --  ALKPHOS  --  48  --   --   BILITOT  --  0.4  --   --    ------------------------------------------------------------------------------------------------------------------ Recent Labs     07/19/20 0459  CHOL 158  HDL 20*  LDLCALC 101*  TRIG 187*  CHOLHDL 7.9    Lab Results  Component Value Date   HGBA1C 9.4 (H) 07/18/2020   ------------------------------------------------------------------------------------------------------------------ No results for input(s): TSH, T4TOTAL, T3FREE, THYROIDAB in the last 72 hours.  Invalid input(s): FREET3 ------------------------------------------------------------------------------------------------------------------ Recent Labs    07/20/20 0624  VITAMINB12 150*  FOLATE 5.4*  FERRITIN 250  TIBC 316  IRON 42*    Coagulation profile Recent Labs  Lab 07/19/20 0459  INR 1.3*    No results for input(s): DDIMER in the last 72 hours.  Cardiac Enzymes No results for input(s): CKMB, TROPONINI, MYOGLOBIN in the last 168 hours.  Invalid input(s): CK ------------------------------------------------------------------------------------------------------------------    Component Value Date/Time   BNP 199.0 (H) 07/18/2020 0100     Roxan Hockey M.D on 07/21/2020 at 7:31 PM  Go to [email or web address] - for contact info  [facility] - Office  [phone]

## 2020-07-21 NOTE — Progress Notes (Signed)
Inpatient Diabetes Program Recommendations  AACE/ADA: New Consensus Statement on Inpatient Glycemic Control (2015)  Target Ranges:  Prepandial:   less than 140 mg/dL      Peak postprandial:   less than 180 mg/dL (1-2 hours)      Critically ill patients:  140 - 180 mg/dL   Lab Results  Component Value Date   GLUCAP 198 (H) 07/21/2020   HGBA1C 9.4 (H) 07/18/2020    Review of Glycemic Control Results for Joseph [name], Joseph [name] (MRN [id number]) as of 07/21/2020 11:35  Ref. Range 07/20/2020 17:25 07/20/2020 21:26 07/21/2020 07:35 07/21/2020 11:30  Glucose-Capillary Latest Ref Range: 70 - 99 mg/dL 675 (H) 449 (H) 201 (H) 198 (H)   Diabetes history: Type 2 DM Outpatient Diabetes medications: Lantus 60 units BID, Humalog 15-40 units TID, Metformin 1000 mg BID, Victoza 1.2 mg QD Current orders for Inpatient glycemic control: Lantus 40 units BID, Novolog 0-15 units TID & Hs  Inpatient Diabetes Program Recommendations:    Consider further increasing Lantus to 45 units BID.   Thanks, Lujean Rave, MSN, RNC-OB Diabetes Coordinator [phone] (8a-5p)

## 2020-07-21 NOTE — Care Management Important Message (Signed)
Important Message  Patient Details  Name: Joseph [name] MRN: [id number] Date of Birth: 1971-05-17   Medicare Important Message Given:  Yes     Corey Harold 07/21/2020, 11:29 AM

## 2020-07-21 NOTE — Progress Notes (Signed)
Progress Note  Patient Name: Joseph [name] Date of Encounter: 07/21/2020  [facility] Cardiologist: Chilton Si, MD   Subjective   Ongoing chest pains at times.   Inpatient Medications    Scheduled Meds: . atorvastatin  80 mg Oral q1800  . buPROPion  150 mg Oral BID  . carvedilol  3.125 mg Oral BID  . Chlorhexidine Gluconate Cloth  6 each Topical Daily  . clopidogrel  75 mg Oral Daily  . doxycycline  100 mg Oral BID  . escitalopram  10 mg Oral Daily  . fenofibrate  54 mg Oral Daily  . ferrous sulfate  325 mg Oral Q breakfast  . fluconazole  100 mg Oral Q Mon  . folic acid  1 mg Oral Daily  . insulin aspart  0-15 Units Subcutaneous TID WC  . insulin aspart  0-5 Units Subcutaneous QHS  . insulin glargine  40 Units Subcutaneous BID  . pantoprazole  40 mg Oral Daily  . pregabalin  150 mg Oral TID  . senna-docusate  2 tablet Oral BID   Continuous Infusions: . cefTRIAXone (ROCEPHIN)  IV 2 g (07/20/20 0849)  . heparin 2,500 Units/hr (07/21/20 0144)   PRN Meds: albuterol, ALPRAZolam, fluticasone, oxyCODONE   Vital Signs    Vitals:   07/20/20 1507 07/20/20 2052 07/20/20 2105 07/21/20 0623  BP: 96/67  122/74 112/78  Pulse: 70  71 (!) 106  Resp: 16  20 20   Temp: 98.2 F (36.8 C)  98.5 F (36.9 C) 98.9 F (37.2 C)  TempSrc: Oral  Oral Oral  SpO2: 98% 95% 97% 90%  Weight:    (!) 170.4 kg  Height:        Intake/Output Summary (Last 24 hours) at 07/21/2020 0834 Last data filed at 07/20/2020 1646 Gross per 24 hour  Intake 244.84 ml  Output --  Net 244.84 ml   Last 3 Weights 07/21/2020 07/19/2020 07/18/2020  Weight (lbs) 375 lb 10.6 oz 382 lb 4.4 oz 382 lb 8 oz  Weight (kg) 170.4 kg 173.4 kg 173.5 kg      Telemetry    SR - Personally Reviewed  ECG    n/a - Personally Reviewed  Physical Exam   GEN: No acute distress.   Neck: No JVD Cardiac: RRR, no murmurs, rubs, or gallops.  Respiratory: Clear to auscultation bilaterally. GI: Soft, nontender,  non-distended  MS: No edema; No deformity. Neuro:  Nonfocal  Psych: Normal affect   Labs    High Sensitivity Troponin:   Recent Labs  Lab 07/18/20 0100 07/18/20 0243 07/19/20 0459  TROPONINIHS 553* 528* 389*      Chemistry Recent Labs  Lab 07/19/20 0459 07/20/20 0624 07/21/20 0557  NA 138 139 141  K 5.0 5.5* 4.8  CL 109 108 110  CO2 21* 22 24  GLUCOSE 259* 311* 222*  BUN 53* 45* 36*  CREATININE 2.26* 1.86* 1.61*  CALCIUM 8.2* 8.6* 8.7*  PROT 6.6  --   --   ALBUMIN 3.0*  --  3.0*  AST 13*  --   --   ALT 15  --   --   ALKPHOS 48  --   --   BILITOT 0.4  --   --   GFRNONAA 33* 42* 50*  GFRAA 39* 49* 58*  ANIONGAP 8 9 7      Hematology Recent Labs  Lab 07/19/20 0459 07/20/20 0624 07/21/20 0557  WBC 8.7 8.8 8.7  RBC 3.41* 3.45* 3.43*  HGB 10.1* 9.8* 9.7*  HCT 31.8* 32.4* 32.3*  MCV 93.3 93.9 94.2  MCH 29.6 28.4 28.3  MCHC 31.8 30.2 30.0  RDW 13.6 13.6 13.5  PLT 237 259 279    BNP Recent Labs  Lab 07/18/20 0100  BNP 199.0*     DDimer No results for input(s): DDIMER in the last 168 hours.   Radiology    No results found.  Cardiac Studies     Patient Profile     [name] is a 49 y.o. male with past medical history of CAD (s/p BMS to LCx in 2017, DES to distal RCA in 05/2019, repeat cath in 10/2019 with DES to distal LCx with patent stents along RCA and mid-LCx), history of DVT/PE (on Xarelto), HTN, HLD and tobacco use who is being seen today for the evaluation of chest pain and elevated troponin values at the request of Dr. Thomes Dinning.  Assessment & Plan    1. CAD - past medical history of CAD (s/p BMS to LCx in 2017, DES to distal RCA in 05/2019, repeat cath in 10/2019 with DES to distal LCx with patent stents along RCA and mid-LCx)  - admitted with chest pain, atypical -peak trop 553 trending down, EKG without acute ischemic changes - 06/2020 echo LVEF 50-55%, poorly visualized endocardium for wall motion  - trop thought to be due to  demand ischemia in setting of sepsis and hypotension on admission. WIth mangagement and improvement of sepsis however chest pains continue  - he describes intermittent pressing/sharp pain midchest radiating up into left should and neck. +SOB +dizziness. Can occur at any time. He states has been going on the last few months but was underreporting his symptoms to avoid hospitlization or further workup. Over the last few days significant increase in severity of symptoms. Pain is similar to his pain at times of his prior stents  - suspect potential new obstructive CAD, perhaps increased demand from recent sepsis led to progression of his symptoms.  - body habitus and pretest probability lower utlitity of noninvasive ischemic evaluation - will plan for cath now that renal function has improved.   - medical therapy with atorva 80, coreg 3.125mg  bid, plavix 75, hep gtt. ACEI on hold due to AKI - since on xarelto at home for prior DVT/PE had been on xarelto and plavix, not aspirin. Restart ASA at this time pending cath with xarelto on hold  2. Severe sepsis - secondary to cellulitis - abx per primary team   3. AKI - admit Cr 3.5 in setting of sepsis, down to 1.6 today  4. History of DVT/PE - DOAC on hold, currently on heparin gtt for potential invasive procedures.    5. Hypotension - presented hyoptensive with sepsis - bp's have normalized.    For questions or updates, please contact [facility] Please consult [email or web address] for contact info under        Signed, Dina Rich, MD  07/21/2020, 8:34 AM

## 2020-07-21 NOTE — Progress Notes (Signed)
ANTICOAGULATION CONSULT NOTE   Pharmacy Consult for IV Heparin Indication: chest pain/ACS  Allergies  Allergen Reactions  . Sulfa Antibiotics Other (See Comments)    headache    Patient Measurements: Height: 6\' 5"  (195.6 cm) Weight: (!) 170.4 kg (375 lb 10.6 oz) IBW/kg (Calculated) : 89.1 Heparin Dosing Weight: 125 kg  Vital Signs: Temp: 98.9 F (37.2 C) (08/02 0623) Temp Source: Oral (08/02 0623) BP: 112/78 (08/02 09-22-1975) Pulse Rate: 106 (08/02 0623)  Labs: Recent Labs    07/18/20 1050 07/19/20 0459 07/19/20 0459 07/19/20 1551 07/20/20 0624 07/20/20 0624 07/20/20 1239 07/20/20 2208 07/21/20 0557  HGB   < > 10.1*  --   --  9.8*  --   --   --  9.7*  HCT  --  31.8*  --   --  32.4*  --   --   --  32.3*  PLT  --  237  --   --  259  --   --   --  279  APTT  --  57*   < > 57* 72*  --  61*  --   --   LABPROT  --  16.0*  --   --   --   --   --   --   --   INR  --  1.3*  --   --   --   --   --   --   --   HEPARINUNFRC   < >  --   --   --  0.33   < > 0.25* 0.35 0.24*  CREATININE  --  2.26*  --   --  1.86*  --   --   --  1.61*  TROPONINIHS  --  389*  --   --   --   --   --   --   --    < > = values in this interval not displayed.    Estimated Creatinine Clearance: 97.6 mL/min (A) (by C-G formula based on SCr of 1.61 mg/dL (H)).   Medical History: Past Medical History:  Diagnosis Date  . Anxiety   . Arthritis    "knees, shoulders, hips, ankles" (07/29/2016)  . Asthma   . CAD (coronary artery disease)    a. 2017: s/p BMS to distal Cx; b. LHC 05/30/2019: 80% mid, distal RCA s/p DES, 30% narrowing of d LM, widely patent LAD w/ luminal irregularities, widely patent stent in dCX  w/ 90+% stenosis distal to stent beofre small trifurcating obtuse marginal (potentially area of restenosis)  . Chronic bronchitis (HCC)   . Chronic ulcer of right great toe (HCC) 09/19/2017  . COPD (chronic obstructive pulmonary disease) (HCC)   . Depression   . DVT (deep venous thrombosis) (HCC)    . GERD (gastroesophageal reflux disease)   . Hyperlipidemia   . Hypertension   . Myocardial infarction (HCC) 1996   "light one"  . PE (pulmonary embolism) 04/2013   On chronic Xarelto  . Peripheral nerve disease   . Pneumonia "several times"  . Type II diabetes mellitus (HCC)     Medications:  Scheduled:  . atorvastatin  80 mg Oral q1800  . buPROPion  150 mg Oral BID  . carvedilol  3.125 mg Oral BID  . Chlorhexidine Gluconate Cloth  6 each Topical Daily  . clopidogrel  75 mg Oral Daily  . doxycycline  100 mg Oral BID  . escitalopram  10 mg Oral Daily  . fenofibrate  54 mg Oral Daily  . ferrous sulfate  325 mg Oral Q breakfast  . fluconazole  100 mg Oral Q Mon  . folic acid  1 mg Oral Daily  . insulin aspart  0-15 Units Subcutaneous TID WC  . insulin aspart  0-5 Units Subcutaneous QHS  . insulin glargine  40 Units Subcutaneous BID  . pantoprazole  40 mg Oral Daily  . pregabalin  150 mg Oral TID  . senna-docusate  2 tablet Oral BID   Infusions:  . cefTRIAXone (ROCEPHIN)  IV 2 g (07/20/20 0849)  . heparin 2,500 Units/hr (07/21/20 0144)    Assessment: 49 yo male presented with CP to start IV heparin for ACS. Baseline labs already drawn - SCr elevated at 3.53. Will need baseline aPTT and heparin level. Note that patient takes Xarelto for hx DVT/PE. Per RN, patient informed her that the last time he took his Xarelto 20mg  was 7/29 at 1130am. HL and APTT correlating( will follow just HL now)  HL this AM is subtherapeutic at 0.24, d/w RN and no issues with infusion.   Goal of Therapy:  Heparin level 0.3-0.7 units/mL Monitor platelets by anticoagulation protocol: Yes   Plan:   Increase IV heparin at 2750 units/hr   Heparin levelin ~6-8 hours and daily with AM labs  Daily CBC  Monitor for S/S of bleeding  8/29, BS Elder Cyphers, BCPS Clinical Pharmacist Pager [phone]

## 2020-07-21 NOTE — Progress Notes (Signed)
ANTICOAGULATION CONSULT NOTE   Pharmacy Consult for IV Heparin Indication: chest pain/ACS  Allergies  Allergen Reactions  . Sulfa Antibiotics Other (See Comments)    headache    Patient Measurements: Height: 6\' 5"  (195.6 cm) Weight: (!) 170.4 kg (375 lb 10.6 oz) IBW/kg (Calculated) : 89.1 Heparin Dosing Weight: 125 kg  Vital Signs: Temp: 98.9 F (37.2 C) (08/02 0623) Temp Source: Oral (08/02 0623) BP: 112/78 (08/02 0623) Pulse Rate: 106 (08/02 0623)  Labs: Recent Labs    07/19/20 0459 07/19/20 0459 07/19/20 1551 07/20/20 0624 07/20/20 0624 07/20/20 1239 07/20/20 1239 07/20/20 2208 07/21/20 0557 07/21/20 1446  HGB 10.1*   < >  --  9.8*  --   --   --   --  9.7*  --   HCT 31.8*  --   --  32.4*  --   --   --   --  32.3*  --   PLT 237  --   --  259  --   --   --   --  279  --   APTT 57*   < > 57* 72*  --  61*  --   --   --   --   LABPROT 16.0*  --   --   --   --   --   --   --   --   --   INR 1.3*  --   --   --   --   --   --   --   --   --   HEPARINUNFRC  --   --   --  0.33   < > 0.25*   < > 0.35 0.24* 0.36  CREATININE 2.26*  --   --  1.86*  --   --   --   --  1.61*  --   TROPONINIHS 389*  --   --   --   --   --   --   --   --   --    < > = values in this interval not displayed.    Estimated Creatinine Clearance: 97.6 mL/min (A) (by C-G formula based on SCr of 1.61 mg/dL (H)).   Medical History: Past Medical History:  Diagnosis Date  . Anxiety   . Arthritis    "knees, shoulders, hips, ankles" (07/29/2016)  . Asthma   . CAD (coronary artery disease)    a. 2017: s/p BMS to distal Cx; b. LHC 05/30/2019: 80% mid, distal RCA s/p DES, 30% narrowing of d LM, widely patent LAD w/ luminal irregularities, widely patent stent in dCX  w/ 90+% stenosis distal to stent beofre small trifurcating obtuse marginal (potentially area of restenosis)  . Chronic bronchitis (HCC)   . Chronic ulcer of right great toe (HCC) 09/19/2017  . COPD (chronic obstructive pulmonary disease)  (HCC)   . Depression   . DVT (deep venous thrombosis) (HCC)   . GERD (gastroesophageal reflux disease)   . Hyperlipidemia   . Hypertension   . Myocardial infarction (HCC) 1996   "light one"  . PE (pulmonary embolism) 04/2013   On chronic Xarelto  . Peripheral nerve disease   . Pneumonia "several times"  . Type II diabetes mellitus (HCC)     Medications:  Scheduled:  . aspirin EC  81 mg Oral Daily  . atorvastatin  80 mg Oral q1800  . buPROPion  150 mg Oral BID  . carvedilol  3.125 mg Oral BID  .  Chlorhexidine Gluconate Cloth  6 each Topical Daily  . clopidogrel  75 mg Oral Daily  . doxycycline  100 mg Oral BID  . escitalopram  10 mg Oral Daily  . fenofibrate  54 mg Oral Daily  . ferrous sulfate  325 mg Oral Q breakfast  . fluconazole  100 mg Oral Q Mon  . folic acid  1 mg Oral Daily  . insulin aspart  0-15 Units Subcutaneous TID WC  . insulin aspart  0-5 Units Subcutaneous QHS  . insulin glargine  40 Units Subcutaneous BID  . pantoprazole  40 mg Oral Daily  . pregabalin  150 mg Oral TID  . senna-docusate  2 tablet Oral BID   Infusions:  . cefTRIAXone (ROCEPHIN)  IV 2 g (07/21/20 0906)  . heparin 2,750 Units/hr (07/21/20 1238)    Assessment: 49 yo male presented with CP to start IV heparin for ACS. Baseline labs already drawn - SCr elevated at 3.53. Will need baseline aPTT and heparin level. Note that patient takes Xarelto for hx DVT/PE. Per RN, patient informed her that the last time he took his Xarelto 20mg  was 7/29 at 1130am. HL and APTT correlating( will follow just HL now)  HL therapeutic at 0.36, d/w RN and no issues with infusion.   Goal of Therapy:  Heparin level 0.3-0.7 units/mL Monitor platelets by anticoagulation protocol: Yes   Plan:   Continue heparin infusion at  2750 units/hr   Heparin levelin ~6-8 hours and daily with AM labs  Daily CBC  Monitor for S/S of bleeding  8/29, PharmD Clinical Pharmacist 07/21/2020 4:00 PM

## 2020-07-22 ENCOUNTER — Inpatient Hospital Stay (HOSPITAL_COMMUNITY)
Admission: EM | Disposition: A | Discharge status: Home / Self Care | Payer: Self-pay | Source: Home / Self Care | Attending: Thoracic Surgery (Cardiothoracic Vascular Surgery)

## 2020-07-22 ENCOUNTER — Encounter (HOSPITAL_COMMUNITY): Payer: Self-pay | Admitting: Cardiology

## 2020-07-22 ENCOUNTER — Ambulatory Visit: Payer: Medicare Other | Admitting: Cardiology

## 2020-07-22 DIAGNOSIS — A419 Sepsis, unspecified organism: Principal | ICD-10-CM

## 2020-07-22 DIAGNOSIS — N179 Acute kidney failure, unspecified: Secondary | ICD-10-CM

## 2020-07-22 DIAGNOSIS — I214 Non-ST elevation (NSTEMI) myocardial infarction: Secondary | ICD-10-CM | POA: Diagnosis present

## 2020-07-22 DIAGNOSIS — I509 Heart failure, unspecified: Secondary | ICD-10-CM | POA: Diagnosis present

## 2020-07-22 DIAGNOSIS — R6520 Severe sepsis without septic shock: Secondary | ICD-10-CM

## 2020-07-22 DIAGNOSIS — I5030 Unspecified diastolic (congestive) heart failure: Secondary | ICD-10-CM | POA: Diagnosis present

## 2020-07-22 DIAGNOSIS — I25110 Atherosclerotic heart disease of native coronary artery with unstable angina pectoris: Secondary | ICD-10-CM

## 2020-07-22 DIAGNOSIS — I5033 Acute on chronic diastolic (congestive) heart failure: Secondary | ICD-10-CM | POA: Diagnosis present

## 2020-07-22 DIAGNOSIS — L03116 Cellulitis of left lower limb: Secondary | ICD-10-CM

## 2020-07-22 HISTORY — DX: Non-ST elevation (NSTEMI) myocardial infarction: I21.4

## 2020-07-22 HISTORY — PX: INTRAVASCULAR PRESSURE WIRE/FFR STUDY: CATH118243

## 2020-07-22 HISTORY — PX: LEFT HEART CATH AND CORONARY ANGIOGRAPHY: CATH118249

## 2020-07-22 LAB — POCT I-STAT, CHEM 8
BUN: 27 mg/dL — ABNORMAL HIGH (ref 6–20)
Calcium, Ion: 1.28 mmol/L (ref 1.15–1.40)
Chloride: 108 mmol/L (ref 98–111)
Creatinine, Ser: 1.5 mg/dL — ABNORMAL HIGH (ref 0.61–1.24)
Glucose, Bld: 109 mg/dL — ABNORMAL HIGH (ref 70–99)
HCT: 31 % — ABNORMAL LOW (ref 39.0–52.0)
Hemoglobin: 10.5 g/dL — ABNORMAL LOW (ref 13.0–17.0)
Potassium: 4.5 mmol/L (ref 3.5–5.1)
Sodium: 144 mmol/L (ref 135–145)
TCO2: 24 mmol/L (ref 22–32)

## 2020-07-22 LAB — BASIC METABOLIC PANEL
Anion gap: 10 (ref 5–15)
BUN: 27 mg/dL — ABNORMAL HIGH (ref 6–20)
CO2: 23 mmol/L (ref 22–32)
Calcium: 8.9 mg/dL (ref 8.9–10.3)
Chloride: 106 mmol/L (ref 98–111)
Creatinine, Ser: 1.38 mg/dL — ABNORMAL HIGH (ref 0.61–1.24)
GFR calc Af Amer: >60 mL/min (ref >60)
GFR calc non Af Amer: >60 mL/min (ref >60)
Glucose, Bld: 90 mg/dL (ref 70–99)
Potassium: 4.4 mmol/L (ref 3.5–5.1)
Sodium: 139 mmol/L (ref 135–145)

## 2020-07-22 LAB — GLUCOSE, CAPILLARY
Glucose-Capillary: 133 mg/dL — ABNORMAL HIGH (ref 70–99)
Glucose-Capillary: 133 mg/dL — ABNORMAL HIGH (ref 70–99)
Glucose-Capillary: 111 mg/dL — ABNORMAL HIGH (ref 70–99)
Glucose-Capillary: 130 mg/dL — ABNORMAL HIGH (ref 70–99)
Glucose-Capillary: 202 mg/dL — ABNORMAL HIGH (ref 70–99)

## 2020-07-22 LAB — PLATELET INHIBITION P2Y12: Platelet Function  P2Y12: 254 [PRU] (ref 182–335)

## 2020-07-22 LAB — POCT ACTIVATED CLOTTING TIME
Activated Clotting Time: 208 seconds
Activated Clotting Time: 252 seconds

## 2020-07-22 SURGERY — LEFT HEART CATH AND CORONARY ANGIOGRAPHY
Anesthesia: LOCAL

## 2020-07-22 MED ORDER — ONDANSETRON HCL 4 MG/2ML IJ SOLN: 4.0000 mg | Freq: Four times a day (QID) | INTRAMUSCULAR | Status: DC | PRN

## 2020-07-22 MED ORDER — SODIUM CHLORIDE 0.9% FLUSH: 3.0000 mL | Freq: Two times a day (BID) | INTRAVENOUS | Status: DC

## 2020-07-22 MED ORDER — FUROSEMIDE 10 MG/ML IJ SOLN
40.0000 mg | Freq: Two times a day (BID) | INTRAMUSCULAR | Status: DC
  Administered 2020-07-22 – 2020-07-23 (×3): 40 mg via INTRAVENOUS
  Filled 2020-07-22 (×3): qty 4

## 2020-07-22 MED ORDER — SODIUM CHLORIDE 0.9% FLUSH
3.0000 mL | Freq: Two times a day (BID) | INTRAVENOUS | Status: DC
  Administered 2020-07-22 – 2020-07-23 (×4): 3 mL via INTRAVENOUS

## 2020-07-22 MED ORDER — SODIUM CHLORIDE 0.9 % IV SOLN: INTRAVENOUS | Status: AC

## 2020-07-22 MED ORDER — VERAPAMIL HCL 2.5 MG/ML IV SOLN
INTRAVENOUS | Status: DC | PRN
  Administered 2020-07-22: 10 mL via INTRA_ARTERIAL

## 2020-07-22 MED ORDER — MIDAZOLAM HCL 2 MG/2ML IJ SOLN
INTRAMUSCULAR | Status: DC | PRN
  Administered 2020-07-22: 2 mg via INTRAVENOUS

## 2020-07-22 MED ORDER — HEPARIN SODIUM (PORCINE) 1000 UNIT/ML IJ SOLN
INTRAMUSCULAR | Status: AC
  Filled 2020-07-22: qty 1

## 2020-07-22 MED ORDER — HYDRALAZINE HCL 20 MG/ML IJ SOLN
INTRAMUSCULAR | Status: AC
  Filled 2020-07-22: qty 1

## 2020-07-22 MED ORDER — VERAPAMIL HCL 2.5 MG/ML IV SOLN
INTRAVENOUS | Status: AC
  Filled 2020-07-22: qty 2

## 2020-07-22 MED ORDER — LIDOCAINE HCL (PF) 1 % IJ SOLN
INTRAMUSCULAR | Status: DC | PRN
  Administered 2020-07-22: 2 mL

## 2020-07-22 MED ORDER — HEPARIN (PORCINE) IN NACL 1000-0.9 UT/500ML-% IV SOLN
INTRAVENOUS | Status: AC
  Filled 2020-07-22: qty 500

## 2020-07-22 MED ORDER — LIDOCAINE HCL (PF) 1 % IJ SOLN
INTRAMUSCULAR | Status: AC
  Filled 2020-07-22: qty 30

## 2020-07-22 MED ORDER — SODIUM CHLORIDE 0.9% FLUSH: 3.0000 mL | INTRAVENOUS | Status: DC | PRN

## 2020-07-22 MED ORDER — ASPIRIN 81 MG PO CHEW
81.0000 mg | CHEWABLE_TABLET | ORAL | Status: AC
  Administered 2020-07-22: 81 mg via ORAL
  Filled 2020-07-22: qty 1

## 2020-07-22 MED ORDER — SODIUM CHLORIDE 0.9 % WEIGHT BASED INFUSION
3.0000 mL/kg/h | INTRAVENOUS | Status: DC
  Administered 2020-07-22: 3 mL/kg/h via INTRAVENOUS

## 2020-07-22 MED ORDER — LABETALOL HCL 5 MG/ML IV SOLN: 10.0000 mg | INTRAVENOUS | Status: AC | PRN

## 2020-07-22 MED ORDER — HEPARIN (PORCINE) IN NACL 1000-0.9 UT/500ML-% IV SOLN
INTRAVENOUS | Status: DC | PRN
  Administered 2020-07-22 (×2): 500 mL

## 2020-07-22 MED ORDER — MIDAZOLAM HCL 2 MG/2ML IJ SOLN
INTRAMUSCULAR | Status: AC
  Filled 2020-07-22: qty 2

## 2020-07-22 MED ORDER — SODIUM CHLORIDE 0.9 % IV SOLN: 250.0000 mL | INTRAVENOUS | Status: DC | PRN

## 2020-07-22 MED ORDER — SODIUM CHLORIDE 0.9 % WEIGHT BASED INFUSION
1.0000 mL/kg/h | INTRAVENOUS | Status: DC
  Administered 2020-07-22: 1 mL/kg/h via INTRAVENOUS

## 2020-07-22 MED ORDER — IOHEXOL 350 MG/ML SOLN
INTRAVENOUS | Status: DC | PRN
  Administered 2020-07-22: 175 mL

## 2020-07-22 MED ORDER — ACETAMINOPHEN 325 MG PO TABS: 650.0000 mg | ORAL_TABLET | ORAL | Status: DC | PRN

## 2020-07-22 MED ORDER — FENTANYL CITRATE (PF) 100 MCG/2ML IJ SOLN
INTRAMUSCULAR | Status: DC | PRN
  Administered 2020-07-22: 50 ug via INTRAVENOUS
  Administered 2020-07-22: 25 ug via INTRAVENOUS

## 2020-07-22 MED ORDER — HEPARIN (PORCINE) 25000 UT/250ML-% IV SOLN
3200.0000 [IU]/h | INTRAVENOUS | Status: DC
  Administered 2020-07-22 – 2020-07-23 (×2): 2750 [IU]/h via INTRAVENOUS
  Administered 2020-07-23: 2950 [IU]/h via INTRAVENOUS
  Filled 2020-07-22 (×4): qty 250

## 2020-07-22 MED ORDER — FUROSEMIDE 10 MG/ML IJ SOLN
INTRAMUSCULAR | Status: DC | PRN
  Administered 2020-07-22: 40 mg via INTRAVENOUS

## 2020-07-22 MED ORDER — HYDRALAZINE HCL 20 MG/ML IJ SOLN: 10.0000 mg | INTRAMUSCULAR | Status: AC | PRN

## 2020-07-22 MED ORDER — HYDRALAZINE HCL 20 MG/ML IJ SOLN
INTRAMUSCULAR | Status: DC | PRN
  Administered 2020-07-22: 10 mg via INTRAVENOUS

## 2020-07-22 MED ORDER — FENTANYL CITRATE (PF) 100 MCG/2ML IJ SOLN
INTRAMUSCULAR | Status: AC
  Filled 2020-07-22: qty 2

## 2020-07-22 MED ORDER — HEPARIN SODIUM (PORCINE) 1000 UNIT/ML IJ SOLN
INTRAMUSCULAR | Status: DC | PRN
  Administered 2020-07-22: 9000 [IU] via INTRAVENOUS
  Administered 2020-07-22: 7000 [IU] via INTRAVENOUS
  Administered 2020-07-22: 5000 [IU] via INTRAVENOUS

## 2020-07-22 MED ORDER — FUROSEMIDE 10 MG/ML IJ SOLN
INTRAMUSCULAR | Status: AC
  Filled 2020-07-22: qty 4

## 2020-07-22 SURGICAL SUPPLY — 13 items
CATH OPTITORQUE TIG 4.0 5F (CATHETERS) ×1 IMPLANT
CATH VISTA GUIDE 6FR XBLAD3.5 (CATHETERS) ×1 IMPLANT
DEVICE RAD COMP TR BAND LRG (VASCULAR PRODUCTS) ×1 IMPLANT
GLIDESHEATH SLEND SS 6F .021 (SHEATH) ×1 IMPLANT
GUIDEWIRE INQWIRE 1.5J.035X260 (WIRE) IMPLANT
GUIDEWIRE PRESSURE COMET II (WIRE) ×1 IMPLANT
INQWIRE 1.5J .035X260CM (WIRE) ×2
KIT ESSENTIALS PG (KITS) ×1 IMPLANT
KIT HEART LEFT (KITS) ×2 IMPLANT
PACK CARDIAC CATHETERIZATION (CUSTOM PROCEDURE TRAY) ×2 IMPLANT
SHEATH PROBE COVER 6X72 (BAG) ×1 IMPLANT
TRANSDUCER W/STOPCOCK (MISCELLANEOUS) ×2 IMPLANT
TUBING CIL FLEX 10 FLL-RA (TUBING) ×2 IMPLANT

## 2020-07-22 NOTE — Progress Notes (Signed)
ANTICOAGULATION CONSULT NOTE   Pharmacy Consult for IV Heparin Indication: chest pain/ACS  Allergies  Allergen Reactions  . Sulfa Antibiotics Other (See Comments)    headache    Patient Measurements: Height: 6\' 5"  (195.6 cm) Weight: (!) 170.8 kg (376 lb 9.6 oz) IBW/kg (Calculated) : 89.1 Heparin Dosing Weight: 125 kg  Vital Signs: Temp: 97.6 F (36.4 C) (08/03 0303) Temp Source: Oral (08/03 0303) BP: 147/99 (08/03 0910) Pulse Rate: 73 (08/03 0910)  Labs: Recent Labs    07/19/20 1551 07/20/20 09/19/20 07/20/20 0624 07/20/20 1239 07/20/20 2208 07/21/20 0557 07/21/20 1446 07/21/20 2055  HGB  --  9.8*  --   --   --  9.7*  --   --   HCT  --  32.4*  --   --   --  32.3*  --   --   PLT  --  259  --   --   --  279  --   --   APTT 57* 72*  --  61*  --   --   --   --   HEPARINUNFRC  --  0.33   < > 0.25*   < > 0.24* 0.36 0.46  CREATININE  --  1.86*  --   --   --  1.61*  --   --    < > = values in this interval not displayed.    Estimated Creatinine Clearance: 97.7 mL/min (A) (by C-G formula based on SCr of 1.61 mg/dL (H)).   Medical History: Past Medical History:  Diagnosis Date  . Anxiety   . Arthritis    "knees, shoulders, hips, ankles" (07/29/2016)  . Asthma   . CAD (coronary artery disease)    a. 2017: s/p BMS to distal Cx; b. LHC 05/30/2019: 80% mid, distal RCA s/p DES, 30% narrowing of d LM, widely patent LAD w/ luminal irregularities, widely patent stent in dCX  w/ 90+% stenosis distal to stent beofre small trifurcating obtuse marginal (potentially area of restenosis)  . Chronic bronchitis (HCC)   . Chronic ulcer of right great toe (HCC) 09/19/2017  . COPD (chronic obstructive pulmonary disease) (HCC)   . Depression   . DVT (deep venous thrombosis) (HCC)   . GERD (gastroesophageal reflux disease)   . Hyperlipidemia   . Hypertension   . Myocardial infarction (HCC) 1996   "light one"  . PE (pulmonary embolism) 04/2013   On chronic Xarelto  . Peripheral nerve  disease   . Pneumonia "several times"  . Type II diabetes mellitus (HCC)     Medications:  Scheduled:  . aspirin EC  81 mg Oral Daily  . atorvastatin  80 mg Oral q1800  . buPROPion  150 mg Oral BID  . carvedilol  3.125 mg Oral BID  . Chlorhexidine Gluconate Cloth  6 each Topical Daily  . clopidogrel  75 mg Oral Daily  . doxycycline  100 mg Oral BID  . escitalopram  10 mg Oral Daily  . fenofibrate  54 mg Oral Daily  . ferrous sulfate  325 mg Oral Q breakfast  . fluconazole  100 mg Oral Q Mon  . folic acid  1 mg Oral Daily  . insulin aspart  0-15 Units Subcutaneous TID WC  . insulin aspart  0-5 Units Subcutaneous QHS  . insulin glargine  40 Units Subcutaneous BID  . pantoprazole  40 mg Oral Daily  . pregabalin  150 mg Oral TID  . senna-docusate  2 tablet Oral BID  Infusions:  . cefTRIAXone (ROCEPHIN)  IV 2 g (07/21/20 0906)  . heparin 2,750 Units/hr (07/21/20 2208)    Assessment: 49 yo male presented with CP to start IV heparin for ACS. Baseline labs already drawn - SCr elevated at 3.53. Will need baseline aPTT and heparin level. Note that patient takes Xarelto for hx DVT/PE. Per RN, patient informed her that the last time he took his Xarelto 20mg  was 7/29 at 1130am. HL and APTT correlating( will follow just HL now)  Cath today showing with severely LM and proximal LCx disease, will refer for CVTS consultation. Plan to restart heparin 8 hours after sheath removal (documented on 8/3@0910 ). No s/sx of bleeding.   Goal of Therapy:  Heparin level 0.3-0.7 units/mL Monitor platelets by anticoagulation protocol: Yes   Plan:  Restart heparin infusion at 2750 units/hr on 8/3@1730  Daily CBC, HL, and for s/sx of bleeding  8/29, PharmD, BCCCP Clinical Pharmacist  Phone: [phone] 07/22/2020 9:52 AM  Please check AMION for all [facility] phone numbers After 10:00 PM, call Main Pharmacy [phone]

## 2020-07-22 NOTE — Progress Notes (Signed)
Pt refused the cpap for the night. Rt will continue to monitor.

## 2020-07-22 NOTE — Plan of Care (Addendum)
Patient received to room 1 via [facility]. Patient alert and oriented x4. Denies any pain or shortness of breath. Oriented to unit and protocol, reviewed order of NPO with patient and verbalizes understanding. Tele showing SR. Heparing drip at 27.5 cc/hr. 0645 No change in status. Bilat groin clipped.

## 2020-07-22 NOTE — Plan of Care (Signed)
Plan of care initiated.

## 2020-07-22 NOTE — Consult Note (Addendum)
[address]       [place] [address]             [phone]        Calil W Paske [facility] Medical Record #[id number] Date of Birth: 1971-12-20  Referring: No ref. provider found Primary Care: [name], NP Primary Cardiologist:Tiffany Oval Linsey, MD  Chief Complaint:    Chief Complaint  Patient presents with  . Chest Pain    History of Present Illness:      The patient is a 49 year old male with past medical history significant for coronary artery disease status post DES to the RCA and a DES to the mid circ (11/02/2019), history of DVT with PE (on Xarelto), current smoker, insulin-dependent type 2 uncontrolled diabetes mellitus with diabetic foot infection, COPD, obesity, dyslipidemia, OSA, AKI, GERD, hypertension, anxiety disorder, and chronic back pain who presented to the emergency department on 07/18/2020 with a complaint of fatigue, shortness of breath, weakness and lightheadedness.  His blood pressure was 77/64 on arrival.  He had a headache and some tightness in his chest at that time.  He denies diaphoresis, palpitations, nausea, vomiting, or abdominal pain.  Work-up in the emergency department showed his troponin of 553, coronavirus test was negative.  He was treated with full dose aspirin, IV normal saline 1 L, and started on IV heparin.  Cardiology was consulted and cardiac cath was performed on 07/22/2020. Cardiac catheterization showed mid left main to ostial LAD lesion of 70% with 55% stenosis of the side branch in the ostial circumflex to mid circumflex, mid circumflex first lesion of 60% with a 50% stenosis of the side branch in the first marginal, first diagonal lesion is 60% stenosed and third diagonal lesion is 60% stenosed, distal RCA lesion is 50% stenosed with 65% stenosis of the side branch in the right PDA.  No significant in-stent stenosis.  Echocardiogram performed on admission showed an estimated left ventricular ejection fraction of  50 to 55%, no significant valvular disease.  We are consulted for coronary artery bypass surgery.  The patient is currently on disability and is worried about paying for his medications.  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  He has chronic back pain in addition to knee and shoulder pain from previous surgeries.  The patient has a 30 year old son and wife.     Current Activity/ Functional Status: Patient was independent with mobility/ambulation, transfers, ADL's, IADL's.   Zubrod Score: At the time of surgery this patient's most appropriate activity status/level should be described as: _0     0    Normal activity, no symptoms _1     1    Restricted in physical strenuous activity but ambulatory, able to do out light work _2     2    Ambulatory and capable of self care, unable to do work activities, up and about                 more than 50%  Of the time                            _3     3    Only limited self care, in bed greater than 50% of waking hours _4     4    Completely disabled, no self care, confined to bed or chair _5     5    Moribund  Past Medical  History:  Diagnosis Date  . Anxiety   . Arthritis    "knees, shoulders, hips, ankles" (07/29/2016)  . Asthma   . CAD (coronary artery disease)    a. 2017: s/p BMS to distal Cx; b. LHC 05/30/2019: 80% mid, distal RCA s/p DES, 30% narrowing of d LM, widely patent LAD w/ luminal irregularities, widely patent stent in [place]  w/ 90+% stenosis distal to stent beofre small trifurcating obtuse marginal (potentially area of restenosis)  . Chronic bronchitis ([place])   . Chronic ulcer of right great toe ([place]) 09/19/2017  . COPD (chronic obstructive pulmonary disease) ([place])   . Depression   . DVT (deep venous thrombosis) ([place])   . GERD (gastroesophageal reflux disease)   . Hyperlipidemia   . Hypertension   . Myocardial infarction ([place]) 1996   "light one"  . PE (pulmonary embolism) 04/2013   On  chronic Xarelto  . Peripheral nerve disease   . Pneumonia "several times"  . Type II diabetes mellitus ([place])     Past Surgical History:  Procedure Laterality Date  . AMPUTATION Right 09/21/2017   Procedure: RIGHT GREAT TOE AMPUTATION, POSSIBLE VAC;  Surgeon: Leandrew Koyanagi, MD;  Location: [facility];  Service: Orthopedics;  Laterality: Right;  . CARDIAC CATHETERIZATION  2006  . CARDIAC CATHETERIZATION  1996   "@ [facility]; when I had my heart attack"  . CARDIAC CATHETERIZATION N/A 07/29/2016   Procedure: Left Heart Cath and Coronary Angiography;  Surgeon: Lorretta Harp, MD;  Location: [facility];  Service: Cardiovascular;  Laterality: N/A;  . CARDIAC CATHETERIZATION N/A 07/29/2016   Procedure: Coronary Stent Intervention;  Surgeon: Lorretta Harp, MD;  Location: [facility];  Service: Cardiovascular;  Laterality: N/A;  . CARPAL TUNNEL RELEASE Bilateral   . CORONARY ANGIOPLASTY WITH STENT PLACEMENT  07/29/2016  . CORONARY STENT INTERVENTION N/A 05/30/2019   Procedure: CORONARY STENT INTERVENTION;  Surgeon: Belva Crome, MD;  Location: [facility];  Service: Cardiovascular;  Laterality: N/A;  . CORONARY STENT INTERVENTION N/A 11/02/2019   Procedure: CORONARY STENT INTERVENTION;  Surgeon: Wellington Hampshire, MD;  Location: [facility];  Service: Cardiovascular;  Laterality: N/A;  . ESOPHAGOGASTRODUODENOSCOPY N/A 09/22/2017   Procedure: ESOPHAGOGASTRODUODENOSCOPY (EGD);  Surgeon: Ladene Artist, MD;  Location: [facility];  Service: Endoscopy;  Laterality: N/A;  . KNEE ARTHROSCOPY Bilateral    "2 on left; 1 on the right"  . LEFT HEART CATH AND CORONARY ANGIOGRAPHY N/A 11/02/2019   Procedure: LEFT HEART CATH AND CORONARY ANGIOGRAPHY;  Surgeon: Wellington Hampshire, MD;  Location: [facility];  Service: Cardiovascular;  Laterality: N/A;  . RIGHT/LEFT HEART CATH AND CORONARY ANGIOGRAPHY N/A 05/30/2019   Procedure: RIGHT/LEFT HEART CATH AND CORONARY ANGIOGRAPHY;   Surgeon: Belva Crome, MD;  Location: [facility];  Service: Cardiovascular;  Laterality: N/A;  . SHOULDER OPEN ROTATOR CUFF REPAIR Bilateral     Social History   Tobacco Use  Smoking Status Current Every Day Smoker  . Packs/day: 1.00  . Years: 34.00  . Pack years: 34.00  . Types: Cigarettes  Smokeless Tobacco Former Systems developer  . Types: Snuff, Chew  Tobacco Comment   8/910/2017 "3 ppd before 2015"    Social History   Substance and Sexual Activity  Alcohol Use Yes   Comment: RARE     Allergies  Allergen Reactions  . Sulfa Antibiotics Other (See Comments)    headache    Current Facility-Administered Medications  Medication  Dose Route Frequency Provider Last Rate Last Admin  . 0.9 %  sodium chloride infusion   Intravenous Continuous Leonie Man, MD 50 mL/hr at 07/22/20 1038 New Bag at 07/22/20 1038  . 0.9 %  sodium chloride infusion  250 mL Intravenous PRN Leonie Man, MD      . acetaminophen (TYLENOL) tablet 650 mg  650 mg Oral Q4H PRN Leonie Man, MD      . albuterol (PROVENTIL) (2.5 MG/3ML) 0.083% nebulizer solution 2.5 mg  2.5 mg Nebulization Q6H PRN Leonie Man, MD      . ALPRAZolam Duanne Moron) tablet 1-2 mg  1-2 mg Oral QID PRN Leonie Man, MD   2 mg at 07/19/20 2213  . aspirin EC tablet 81 mg  81 mg Oral Daily Leonie Man, MD   81 mg at 07/22/20 1013  . atorvastatin (LIPITOR) tablet 80 mg  80 mg Oral q1800 Leonie Man, MD   80 mg at 07/21/20 1836  . buPROPion (WELLBUTRIN SR) 12 hr tablet 150 mg  150 mg Oral BID Leonie Man, MD   150 mg at 07/22/20 1014  . carvedilol (COREG) tablet 3.125 mg  3.125 mg Oral BID Leonie Man, MD   3.125 mg at 07/22/20 1013  . cefTRIAXone (ROCEPHIN) 2 g in sodium chloride 0.9 % 100 mL IVPB  2 g Intravenous Q24H Leonie Man, MD 200 mL/hr at 07/22/20 1002 2 g at 07/22/20 1002  . Chlorhexidine Gluconate Cloth 2 % PADS 6 each  6 each Topical Daily Leonie Man, MD   6 each at 07/19/20 1030  .  doxycycline (VIBRA-TABS) tablet 100 mg  100 mg Oral BID Leonie Man, MD   100 mg at 07/22/20 1013  . escitalopram (LEXAPRO) tablet 10 mg  10 mg Oral Daily Leonie Man, MD   10 mg at 07/22/20 1013  . fenofibrate tablet 54 mg  54 mg Oral Daily Leonie Man, MD   54 mg at 07/22/20 1014  . ferrous sulfate tablet 325 mg  325 mg Oral Q breakfast Leonie Man, MD   325 mg at 07/22/20 0800  . fluconazole (DIFLUCAN) tablet 100 mg  100 mg Oral Q Mon Leonie Man, MD   100 mg at 07/21/20 0858  . fluticasone (FLONASE) 50 MCG/ACT nasal spray 2 spray  2 spray Each Nare Daily PRN Leonie Man, MD   2 spray at 07/21/20 1120  . folic acid (FOLVITE) tablet 1 mg  1 mg Oral Daily Leonie Man, MD   1 mg at 07/22/20 1013  . furosemide (LASIX) injection 40 mg  40 mg Intravenous BID Leonie Man, MD      . heparin ADULT infusion 100 units/mL (25000 units/246m sodium chloride 0.45%)  2,750 Units/hr Intravenous Continuous FCharlynne Cousins MD      . hydrALAZINE (APRESOLINE) injection 10 mg  10 mg Intravenous Q20 Min PRN HLeonie Man MD      . insulin aspart (novoLOG) injection 0-15 Units  0-15 Units Subcutaneous TID WC HLeonie Man MD   3 Units at 07/21/20 1834  . insulin aspart (novoLOG) injection 0-5 Units  0-5 Units Subcutaneous QHS HLeonie Man MD   2 Units at 07/21/20 2129  . insulin glargine (LANTUS) injection 40 Units  40 Units Subcutaneous BID HLeonie Man MD   40 Units at 07/21/20 2128  . labetalol (NORMODYNE) injection 10 mg  10 mg Intravenous Q10  min PRN Leonie Man, MD      . ondansetron [facility]) injection 4 mg  4 mg Intravenous Q6H PRN Leonie Man, MD      . oxyCODONE (Oxy IR/ROXICODONE) immediate release tablet 15 mg  15 mg Oral Q8H PRN Leonie Man, MD   15 mg at 07/22/20 1037  . pantoprazole (PROTONIX) EC tablet 40 mg  40 mg Oral Daily Leonie Man, MD   40 mg at 07/22/20 1003  . pregabalin (LYRICA) capsule 150 mg  150 mg Oral TID  Leonie Man, MD   150 mg at 07/22/20 1013  . senna-docusate (Senokot-S) tablet 2 tablet  2 tablet Oral BID Leonie Man, MD   2 tablet at 07/22/20 1013  . sodium chloride flush (NS) 0.9 % injection 3 mL  3 mL Intravenous Q12H Leonie Man, MD      . sodium chloride flush (NS) 0.9 % injection 3 mL  3 mL Intravenous PRN Leonie Man, MD        Medications Prior to Admission  Medication Sig Dispense Refill Last Dose  . ALPRAZolam (XANAX) 1 MG tablet Take 1-2 mg by mouth 4 (four) times daily as needed (anxiety).    07/17/2020 at Unknown time  . amLODipine (NORVASC) 2.5 MG tablet Take 2.5 mg by mouth daily.   07/17/2020 at Unknown time  . buPROPion (WELLBUTRIN SR) 150 MG 12 hr tablet Take 150 mg by mouth 2 (two) times daily.   0 07/17/2020 at Unknown time  . carvedilol (COREG) 12.5 MG tablet Take 1 tablet (12.5 mg total) by mouth 2 (two) times daily. 180 tablet 1 07/17/2020 at 1100  . clopidogrel (PLAVIX) 75 MG tablet Take 1 tablet (75 mg total) by mouth daily. Please schedule yearly appointment for more refills, thank you! 1st attempt 30 tablet 5 07/17/2020 at Unknown time  . diphenhydrAMINE HCl (BENADRYL PO) Take 6 tablets by mouth daily.   07/17/2020 at Unknown time  . doxycycline (VIBRA-TABS) 100 MG tablet Take 100 mg by mouth 2 (two) times daily.    07/17/2020 at Unknown time  . escitalopram (LEXAPRO) 10 MG tablet Take 10 mg by mouth daily.   07/17/2020 at Unknown time  . fenofibrate (TRICOR) 145 MG tablet Take 145 mg by mouth daily.    07/17/2020 at Unknown time  . fluticasone (FLONASE) 50 MCG/ACT nasal spray Place 2 sprays into both nostrils daily as needed for allergies.   07/17/2020 at Unknown time  . HUMALOG KWIKPEN 100 UNIT/ML SOPN Inject 15-40 Units into the skin 3 (three) times daily with meals. sliding scale   07/17/2020 at 1430  . hydrochlorothiazide (MICROZIDE) 12.5 MG capsule Take 1 capsule (12.5 mg total) by mouth daily. 90 capsule 1 07/17/2020 at Unknown time  . LANTUS SOLOSTAR  100 UNIT/ML Solostar Pen Inject 60 Units into the skin 2 (two) times daily.    07/18/2020 at 1100  . lisinopril (ZESTRIL) 10 MG tablet Take 1 tablet (10 mg total) by mouth daily. 90 tablet 1 07/17/2020 at Unknown time  . meclizine (ANTIVERT) 25 MG tablet Take 25-50 mg by mouth See admin instructions. Take 2 tablets (50 mg) by mouth in the morning & take 1 tablet (25 mg) by mouth at night.   07/17/2020 at Unknown time  . metFORMIN (GLUCOPHAGE) 1000 MG tablet Take 1 tablet (1,000 mg total) by mouth 2 (two) times daily with a meal.   07/17/2020 at Unknown time  . nitroGLYCERIN (NITROSTAT) 0.4 MG SL tablet  Place 1 tablet (0.4 mg total) under the tongue every 5 (five) minutes as needed for chest pain. 25 tablet 3 07/17/2020 at Unknown time  . oxyCODONE (ROXICODONE) 15 MG immediate release tablet Take 15 mg by mouth 5 (five) times daily.    07/17/2020 at Unknown time  . pantoprazole (PROTONIX) 40 MG tablet Take 1 tablet (40 mg total) by mouth daily. (Patient taking differently: Take 80 mg by mouth daily. ) 30 tablet 0 07/17/2020 at Unknown time  . pregabalin (LYRICA) 150 MG capsule Take 150 mg by mouth 3 (three) times daily.    07/17/2020 at Unknown time  . promethazine (PHENERGAN) 25 MG tablet Take 50 mg by mouth every 6 (six) hours as needed for nausea.    07/17/2020 at Unknown time  . Rivaroxaban (XARELTO) 20 MG TABS Take 1 tablet (20 mg total) by mouth daily. (Patient taking differently: Take 20 mg by mouth daily. Take with food) 30 tablet 1 07/17/2020 at 1100  . VICTOZA 18 MG/3ML SOPN Inject 1.2 mg into the skin at bedtime.   07/16/2020 at Unknown time  . albuterol (PROAIR HFA) 108 (90 BASE) MCG/ACT inhaler Inhale 2 puffs into the lungs every 6 (six) hours as needed for wheezing or shortness of breath.    unknown  . atorvastatin (LIPITOR) 80 MG tablet Take 1 tablet (80 mg total) by mouth daily at 6 PM. (Patient not taking: Reported on 07/18/2020) 30 tablet 2 Not Taking at Unknown time  . blood glucose meter kit and  supplies KIT Dispense based on patient and insurance preference. Use up to four times daily as directed. (FOR ICD-9 250.00, 250.01). (Patient not taking: Reported on 07/18/2020) 1 each 0 Not Taking at Unknown time  . fluconazole (DIFLUCAN) 100 MG tablet Take 100 mg by mouth every Monday.   07/14/2020  . ofloxacin (OCUFLOX) 0.3 % ophthalmic solution Place 2 drops into both eyes 2 (two) times daily as needed (infection).    unknown    Family History  Problem Relation Age of Onset  . Hypertension Brother   . Hypertension Father   . Diabetes Other   . Hyperlipidemia Other      Review of Systems:   Review of Systems  Constitutional: Positive for malaise/fatigue. Negative for chills and fever.  Respiratory: Positive for shortness of breath.   Cardiovascular: Positive for chest pain and leg swelling.  Gastrointestinal: Negative for abdominal pain, heartburn, nausea and vomiting.  Musculoskeletal: Positive for back pain and joint pain.  Neurological: Positive for weakness.  Psychiatric/Behavioral: Positive for depression. The patient is nervous/anxious.    Pertinent items are noted in HPI.   Physical Exam: BP 120/77   Pulse 69   Temp 98 F (36.7 C)   Resp 16   Ht _0  (1.956 m)   Wt (!) 170.8 kg   SpO2 100%   BMI 44.66 kg/m    General appearance: alert, cooperative and no distress Resp: clear to auscultation bilaterally Cardio: regular rate and rhythm, S1, S2 normal, no murmur, click, rub or gallop GI: soft, non-tender; bowel sounds normal; no masses,  no organomegaly Extremities: right great toe amputation, +errythema and edema in left lower ext.  Neurologic: Grossly normal  Diagnostic Studies & Laboratory data:     Recent Radiology Findings:   CARDIAC CATHETERIZATION  Result Date: 03/25/2702  LV end diastolic pressure is severely elevated -consistent with ACUTE DIASTOLIC HEART FAILURE  Mid LM to Ost LAD lesion is 70% stenosed with 55% stenosed side branch in [place]  Cx to  Mid Cx.  Mid Cx-1 lesion is 60% stenosed with 50% stenosed side branch in 1st Mrg.  Mid Cx-2 overlapping BMS and DES stent segment is 5% stenosed.  1st Diag lesion is 60% stenosed. 3rd Diag lesion is 60% stenosed.  Prox RCA to Mid RCA stented segment is 25% stenosed.  Dist RCA lesion is 50% stenosed with 65% stenosed side branch in RPDA.  -------------------------------------------- \SUMMARY  Severe distal LEFT MAIN 70% stenosis (highly positive DFR 0.85 and proximal LAD)  Relatively normal LAD with minimal disease, 1st & 3rd Diag having proximal 50 to 60% stenosis.  Diffuse 55% calcified proximal to mid LCx with a focal 60% stenosis just prior to overlapping VISION BMS, and RESOLUTE ONYX DES in the main LCx prior to bifurcation.  Minimal RCA disease with roughly 20% ISR in the stented segment of the RCA.  Ostial PDA 50-60%  LV gram not performed due to severely elevated LVEDP of 33 mmHg -> consistent with ACUTE DIASTOLIC HEART FAILURE (will need echocardiogram to assess EF) RECOMMENDATIONS:  With severely LM and proximal LCx disease, will refer for CVTS consultation. -->  Plavix has been discontinued  He would likely need several days for Plavix washout -> he will also need aggressive blood pressure management along with diuresis.  I have ordered Lasix 40 mg IV twice daily starting this evening.  Restart IV heparin 8 hours after sheath removal.  Continue aggressive CRF Guideline Directed Therapy. Glenetta Hew, MD    I have independently reviewed the above radiologic studies and discussed with the patient   Recent Lab Findings: Lab Results  Component Value Date   WBC 8.7 07/21/2020   HGB 9.7 (L) 07/21/2020   HCT 32.3 (L) 07/21/2020   PLT 279 07/21/2020   GLUCOSE 222 (H) 07/21/2020   CHOL 158 07/19/2020   TRIG 187 (H) 07/19/2020   HDL 20 (L) 07/19/2020   LDLDIRECT 120.0 05/28/2015   LDLCALC 101 (H) 07/19/2020   ALT 15 07/19/2020   AST 13 (L) 07/19/2020   NA 141 07/21/2020   K 4.8  07/21/2020   CL 110 07/21/2020   CREATININE 1.61 (H) 07/21/2020   BUN 36 (H) 07/21/2020   CO2 24 07/21/2020   INR 1.3 (H) 07/19/2020   HGBA1C 9.4 (H) 07/18/2020      Assessment / Plan:      1. Severe left main with multivessel CAD-continue asa, statin, coreg, and heparin gtt. he was on Xarelto and Plavix at home. 2.  Uncontrolled type 2 diabetes mellitus.  A1c is 9.4.  On multiple p.o. medications at home.  Continue insulin while in the hospital. Albuterol at home. 3.  Current everyday smoker for the last 36 years.  History of COPD. 4.  He is currently on doxycycline for his left lower leg cellulitis. 5.  History of pulmonary embolism and deep vein thrombosis.  Continue heparin drip. 6.  AKI creatinine is 1.38 today.  Trending down. 7.  Chronic back pain.  He takes 15 mg of oxycodone 3 times a day. 8.  History of sleep apnea 9.  Possible history of atrial fibrillation.  Patient is a poor historian and relies on his wife who was not present today during the interview.  Plan: Coronary artery bypass surgery reviewed with the patient.  All questions were answered to the patient's satisfaction.  No family was at the bedside during our interview. Cardiac catheterization reviewed with the patient as well as his echocardiogram.  Patient is aware that he would  be high risk for surgery due to multiple comorbidities.  Dr. Kipp Brood to review the cardiac catheterization and speak with the patient later today.   I  spent 40 minutes counseling the patient face to face.   Nicholes Rough, PA-C 07/22/2020 10:50 AM    Agree with above. This is a 49 year old male with a history of coronary artery disease and poorly controlled diabetes who presents with left main/three-vessel coronary disease.  On review of his images he has acceptable targets in his circumflex and LAD.  There is tandem lesions in in his right system with tight stenosis at the takeoff of his PDA.  He also has 2 large diagonal vessels with  moderate stenosis in each.  We will review his echocardiogram his LV and RV function are preserved and there is no significant valvular disease.  The risks and benefits of surgical revascularization were discussed with him in detail and he has agreed to proceed with this procedure.  He will require vein mapping and also assessment of his left radial artery given his age.  Given his body habitus and hemoglobin A1c of 9.4 he is not a candidate for bilateral mammary harvest.  He also has had bilateral carpal tunnel release which may make radial harvest slightly challenging.  He does have a history of Plavix use for previous PCI back in 2020, but I will check a P2 Y 12 to determine whether he will require a full Plavix washout.  He tentatively will require a CABG 4 with endoscopic harvesting of his left radial artery.  Lenore Moyano Bary Leriche

## 2020-07-22 NOTE — Progress Notes (Signed)
Pt discharged via stretcher with [facility] to be transported to [facility] for LHC in stable condition. Report given to nurse Koren Bound on 3 [place].

## 2020-07-22 NOTE — Progress Notes (Signed)
[facility] PROGRESS NOTE    Progress Note  Joseph [name]  KDT:[id number] DOB: 10-Oct-1971 DOA: 07/17/2020 PCP: Marva Panda, NP     Brief Narrative:   Joseph [name] is an 49 y.o. male past medical history of CAD status post stent to the RCA in June 2020, essential hypertension PE/DVT on Xarelto and COPD who comes into the hospital with 1 day onset of weakness and lightheadedness, he relates he was at home took his blood pressure and it was 79/50 he was also short of breath with exertion.  In the ED his blood pressure was 77/64 troponins are 553, BUN of 59 creatinine of 3.5 (with a baseline of 1.2-1.3), was found to be septic started empirically on antibiotics fluid resuscitated cardiology was consulted and as he was minimizing his symptoms are charted avoid for hospitalization and further work-up cardiology recommended ischemic work-up he was given aspirin IV fluid and started on IV heparin.  Assessment/Plan:   Severe sepsis secondary to lower extremity cellulitis: Treat empirically Rocephin and doxycycline, blood cultures to date.  Dopplers negative for DVT. He has defervesced his leukocytosis improved. He was fluid resuscitated, antihypertensive medications were held on admission. Sepsis physiology has resolved.  Acute kidney injury: In the setting of sepsis ACE inhibitor and diuretic use. On admission creatinine 3.5 after IV fluid hydration his creatinine is improved 1.6, his basic metabolic panel is pending this morning.  Atypical chest pain with a history of CAD: Cardiology was consulted. He was started on IV heparin, Plavix, due to the high suspicious for new obstructive CAD.  After his renal function improve after fluid resuscitation and clearing of his sepsis cardiology recommended invasive ischemic work-up on 07/22/2020.  Morbid obesity/obstructive sleep apnea: Advised use of CPAP, as patient was noncompliant with CPAP at home will need to follow-up with  pulmonology as an outpatient.  History of DVT and PE: Lower extremity DVT was negative, VQ scan without PE. His Xarelto was held he was started on IV heparin for possible unstable angina. After cardiac cath can probably switch back to Xarelto.  Acute normocytic anemia: Of unclear etiology. B12 150, he was started on intramuscular B12  daily Iron 250. Folate of 5.4, continue oral repletion.  Chronic pain syndrome: Continue opiate regimen.  Still independent diabetes mellitus type 2: With an A1c of 9.4, currently on Lantus plus sliding scale. He is currently n.p.o. for possible left heart cath.  Tobacco abuse: Back combination with CAD and diabetes mellitus, he has been counseled.  COPD (chronic obstructive pulmonary disease) (HCC) Follow-up with pulmonary with PFTs and sleep studies.  DVT prophylaxis: heparin Family Communication:none Status is: Inpatient        Code Status:     Code Status Orders  (From admission, onward)         Start     Ordered   07/18/20 0352  Full code  Continuous        07/18/20 0354        Code Status History    Date Active Date Inactive Code Status Order ID Comments User Context   11/01/2019 1929 11/03/2019 1930 Full Code [id number]  Arty Baumgartner, NP ED   05/30/2019 1958 05/31/2019 1623 Full Code [id number]  Lyn Records, MD Inpatient   05/30/2019 1323 05/30/2019 1958 Full Code [id number]  Lyn Records, MD Inpatient   09/16/2017 1825 09/23/2017 1942 Full Code [id number]  Catarina Hartshorn, MD Inpatient   07/29/2016 0037 07/30/2016 1713 Full Code [id number]  Lyda Perone  Judie Petit, DO ED   Advance Care Planning Activity        IV Access:    Peripheral IV   Procedures and diagnostic studies:   No results found.   Medical Consultants:    None.  Anti-Infectives:   None  Subjective:    Joseph [name] no complains  Objective:    Vitals:   07/21/20 0623 07/21/20 2100 07/22/20 0140 07/22/20 0303  BP: 112/78 137/86 129/84  121/78  Pulse: (!) 106 74 67 60  Resp: 20 20 20 15   Temp: 98.9 F (37.2 C) 98.4 F (36.9 C) 98 F (36.7 C) 97.6 F (36.4 C)  TempSrc: Oral Oral Oral Oral  SpO2: 90% 98% 100% 97%  Weight: (!) 170.4 kg  (!) 170.8 kg   Height:   6\' 5"  (1.956 m)    SpO2: 97 % O2 Flow Rate (L/min): 0 L/min   Intake/Output Summary (Last 24 hours) at 07/22/2020 Last data filed at 07/22/2020 0700 Gross per 24 hour  Intake 2939.83 ml  Output --  Net 2939.83 ml   Filed Weights   07/19/20 0414 07/21/20 0623 07/22/20 0140  Weight: (!) 173.4 kg (!) 170.4 kg (!) 170.8 kg    Exam: General exam: In no acute distress. Respiratory system: Good air movement and clear to auscultation. Cardiovascular system: S1 & S2 heard, RRR.  Gastrointestinal system: Abdomen is nondistended, soft and nontender.  Extremities: No pedal edema. Skin: No rashes, lesions or ulcers Psychiatry: Judgement and insight appear normal. Mood & affect appropriate.    Data Reviewed:    Labs: Basic Metabolic Panel: Recent Labs  Lab 07/18/20 0100 07/18/20 0100 07/19/20 0459 07/19/20 0459 07/20/20 0624 07/21/20 0557  NA 134*  --  138  --  139 141  K 5.3*   < > 5.0   < > 5.5* 4.8  CL 107  --  109  --  108 110  CO2 20*  --  21*  --  22 24  GLUCOSE 281*  --  259*  --  311* 222*  BUN 59*  --  53*  --  45* 36*  CREATININE 3.53*  --  2.26*  --  1.86* 1.61*  CALCIUM 7.7*  --  8.2*  --  8.6* 8.7*  MG 1.4*  --   --   --   --   --   PHOS 3.9  --   --   --   --  2.2*   < > = values in this interval not displayed.   GFR Estimated Creatinine Clearance: 97.7 mL/min (A) (by C-G formula based on SCr of 1.61 mg/dL (H)). Liver Function Tests: Recent Labs  Lab 07/19/20 0459 07/21/20 0557  AST 13*  --   ALT 15  --   ALKPHOS 48  --   BILITOT 0.4  --   PROT 6.6  --   ALBUMIN 3.0* 3.0*   No results for input(s): LIPASE, AMYLASE in the last 168 hours. No results for input(s): AMMONIA in the last 168 hours. Coagulation  profile Recent Labs  Lab 07/19/20 0459  INR 1.3*   COVID-19 Labs  Recent Labs    07/20/20 0624  FERRITIN 250    Lab Results  Component Value Date   SARSCOV2NAA NEGATIVE 07/18/2020   SARSCOV2NAA NEGATIVE 11/01/2019   SARSCOV2NAA NOT DETECTED 05/25/2019    CBC: Recent Labs  Lab 07/18/20 0100 07/18/20 0243 07/19/20 0459 07/20/20 0624 07/21/20 0557  WBC 13.2* 13.8* 8.7 8.8 8.7  NEUTROABS  10.2*  --   --   --   --   HGB 10.0* 10.4* 10.1* 9.8* 9.7*  HCT 31.9* 33.8* 31.8* 32.4* 32.3*  MCV 94.4 97.4 93.3 93.9 94.2  PLT 204 216 237 259 279   Cardiac Enzymes: No results for input(s): CKTOTAL, CKMB, CKMBINDEX, TROPONINI in the last 168 hours. BNP (last 3 results) No results for input(s): PROBNP in the last 8760 hours. CBG: Recent Labs  Lab 07/21/20 0735 07/21/20 1130 07/21/20 1702 07/21/20 2102 07/22/20 0559  GLUCAP 195* 198* 165* 246* 133*   D-Dimer: No results for input(s): DDIMER in the last 72 hours. Hgb A1c: No results for input(s): HGBA1C in the last 72 hours. Lipid Profile: No results for input(s): CHOL, HDL, LDLCALC, TRIG, CHOLHDL, LDLDIRECT in the last 72 hours. Thyroid function studies: No results for input(s): TSH, T4TOTAL, T3FREE, THYROIDAB in the last 72 hours.  Invalid input(s): FREET3 Anemia work up: Recent Labs    07/20/20 0624  VITAMINB12 150*  FOLATE 5.4*  FERRITIN 250  TIBC 316  IRON 42*   Sepsis Labs: Recent Labs  Lab 07/18/20 0100 07/18/20 0100 07/18/20 0243 07/19/20 0459 07/20/20 0624 07/21/20 0557  WBC 13.2*   < > 13.8* 8.7 8.8 8.7  LATICACIDVEN 1.5  --   --   --   --   --    < > = values in this interval not displayed.   Microbiology Recent Results (from the past 240 hour(s))  SARS Coronavirus 2 by RT PCR (hospital order, performed in [facility] hospital lab) Nasopharyngeal Nasopharyngeal Swab     Status: None   Collection Time: 07/18/20  2:27 AM   Specimen: Nasopharyngeal Swab  Result Value Ref Range Status   SARS  Coronavirus 2 NEGATIVE NEGATIVE Final    Comment: (NOTE) SARS-CoV-2 target nucleic acids are NOT DETECTED.  The SARS-CoV-2 RNA is generally detectable in upper and lower respiratory specimens during the acute phase of infection. The lowest concentration of SARS-CoV-2 viral copies this assay can detect is 250 copies / mL. A negative result does not preclude SARS-CoV-2 infection and should not be used as the sole basis for treatment or other patient management decisions.  A negative result may occur with improper specimen collection / handling, submission of specimen other than nasopharyngeal swab, presence of viral mutation(s) within the areas targeted by this assay, and inadequate number of viral copies (<250 copies / mL). A negative result must be combined with clinical observations, patient history, and epidemiological information.  Fact Sheet for Patients:   BoilerBrush.com.cy  Fact Sheet for Healthcare Providers: https://pope.com/  This test is not yet approved or  cleared by the [place] FDA and has been authorized for detection and/or diagnosis of SARS-CoV-2 by FDA under an Emergency Use Authorization (EUA).  This EUA will remain in effect (meaning this test can be used) for the duration of the COVID-19 declaration under Section 564(b)(1) of the Act, 21 U.S.C. section 360bbb-3(b)(1), unless the authorization is terminated or revoked sooner.  Performed at [facility], [address], [place], [name] [address]   Culture, blood (routine x 2)     Status: None (Preliminary result)   Collection Time: 07/18/20 10:50 AM   Specimen: BLOOD RIGHT HAND  Result Value Ref Range Status   Specimen Description BLOOD RIGHT HAND  Final   Special Requests   Final    BOTTLES DRAWN AEROBIC AND ANAEROBIC Blood Culture results may not be optimal due to an inadequate volume of blood received in  culture bottles   Culture   Final    NO GROWTH 4  DAYS Performed at [facility], [address] [place], [place] [address]    Report Status PENDING  Incomplete  Culture, blood (routine x 2)     Status: None (Preliminary result)   Collection Time: 07/18/20 10:50 AM   Specimen: BLOOD LEFT HAND  Result Value Ref Range Status   Specimen Description BLOOD LEFT HAND  Final   Special Requests   Final    BOTTLES DRAWN AEROBIC AND ANAEROBIC Blood Culture adequate volume   Culture   Final    NO GROWTH 4 DAYS Performed at [facility], [address] [place], [place] [address]    Report Status PENDING  Incomplete  MRSA PCR Screening     Status: None   Collection Time: 07/18/20  4:12 PM   Specimen: Nasal Mucosa; Nasopharyngeal  Result Value Ref Range Status   MRSA by PCR NEGATIVE NEGATIVE Final    Comment:        The GeneXpert MRSA Assay (FDA approved for NASAL specimens only), is one component of a comprehensive MRSA colonization surveillance program. It is not intended to diagnose MRSA infection nor to guide or monitor treatment for MRSA infections. Performed at [facility], [address] [place], [place] [address]      Medications:   . aspirin EC  81 mg Oral Daily  . atorvastatin  80 mg Oral q1800  . buPROPion  150 mg Oral BID  . carvedilol  3.125 mg Oral BID  . Chlorhexidine Gluconate Cloth  6 each Topical Daily  . clopidogrel  75 mg Oral Daily  . doxycycline  100 mg Oral BID  . escitalopram  10 mg Oral Daily  . fenofibrate  54 mg Oral Daily  . ferrous sulfate  325 mg Oral Q breakfast  . fluconazole  100 mg Oral Q Mon  . folic acid  1 mg Oral Daily  . insulin aspart  0-15 Units Subcutaneous TID WC  . insulin aspart  0-5 Units Subcutaneous QHS  . insulin glargine  40 Units Subcutaneous BID  . pantoprazole  40 mg Oral Daily  . pregabalin  150 mg Oral TID  . senna-docusate  2 tablet Oral BID  . sodium chloride flush  3 mL Intravenous Q12H   Continuous Infusions: . sodium chloride    . sodium chloride    .  cefTRIAXone (ROCEPHIN)  IV 2 g (07/21/20 0906)  . heparin 2,750 Units/hr (07/21/20 2208)      LOS: 3 days   Marinda ElkAbraham Feliz Ortiz  [facility]  07/22/2020, 7:38 AM

## 2020-07-22 NOTE — Interval H&P Note (Signed)
History and Physical Interval Note:  07/22/2020 7:33 AM  Joseph [name]  has presented today for surgery, with the diagnosis of NSTEMI.  The various methods of treatment have been discussed with the patient and family. After consideration of risks, benefits and other options for treatment, the patient has consented to  Procedure(s): LEFT HEART CATH AND CORONARY ANGIOGRAPHY (N/A)  PERCUTANEOUS CORONARY INTERVENTION  as a surgical intervention.  The patient's history has been reviewed, patient examined, no change in status, stable for surgery.  I have reviewed the patient's chart and labs.  Questions were answered to the patient's satisfaction.    Cath Lab Visit (complete for each Cath Lab visit)  Clinical Evaluation Leading to the Procedure:   ACS: Yes.    Non-ACS:    Anginal Classification: CCS III  Anti-ischemic medical therapy: Minimal Therapy (1 class of medications)  Non-Invasive Test Results: No non-invasive testing performed  Prior CABG: No previous CABG    Bryan Lemma

## 2020-07-23 ENCOUNTER — Inpatient Hospital Stay (HOSPITAL_COMMUNITY): Payer: Medicare Other

## 2020-07-23 DIAGNOSIS — Z01810 Encounter for preprocedural cardiovascular examination: Secondary | ICD-10-CM

## 2020-07-23 LAB — CULTURE, BLOOD (ROUTINE X 2)
Culture: NO GROWTH
Culture: NO GROWTH
Special Requests: ADEQUATE

## 2020-07-23 LAB — ABO/RH: ABO/RH(D): O POS

## 2020-07-23 LAB — BASIC METABOLIC PANEL
Anion gap: 9 (ref 5–15)
BUN: 27 mg/dL — ABNORMAL HIGH (ref 6–20)
CO2: 25 mmol/L (ref 22–32)
Calcium: 9 mg/dL (ref 8.9–10.3)
Chloride: 105 mmol/L (ref 98–111)
Creatinine, Ser: 1.63 mg/dL — ABNORMAL HIGH (ref 0.61–1.24)
GFR calc Af Amer: 57 mL/min — ABNORMAL LOW (ref >60)
GFR calc non Af Amer: 49 mL/min — ABNORMAL LOW (ref >60)
Glucose, Bld: 95 mg/dL (ref 70–99)
Potassium: 4.4 mmol/L (ref 3.5–5.1)
Sodium: 139 mmol/L (ref 135–145)

## 2020-07-23 LAB — URINALYSIS, ROUTINE W REFLEX MICROSCOPIC
Bilirubin Urine: NEGATIVE
Glucose, UA: NEGATIVE mg/dL
Hgb urine dipstick: NEGATIVE
Ketones, ur: NEGATIVE mg/dL
Leukocytes,Ua: NEGATIVE
Nitrite: NEGATIVE
Protein, ur: NEGATIVE mg/dL
Specific Gravity, Urine: 1.017 (ref 1.005–1.030)
pH: 5 (ref 5.0–8.0)

## 2020-07-23 LAB — CBC
HCT: 33.3 % — ABNORMAL LOW (ref 39.0–52.0)
Hemoglobin: 10.5 g/dL — ABNORMAL LOW (ref 13.0–17.0)
MCH: 28.8 pg (ref 26.0–34.0)
MCHC: 31.5 g/dL (ref 30.0–36.0)
MCV: 91.5 fL (ref 80.0–100.0)
Platelets: 300 K/uL (ref 150–400)
RBC: 3.64 MIL/uL — ABNORMAL LOW (ref 4.22–5.81)
RDW: 13.5 % (ref 11.5–15.5)
WBC: 8.9 K/uL (ref 4.0–10.5)
nRBC: 0 % (ref 0.0–0.2)

## 2020-07-23 LAB — PREPARE RBC (CROSSMATCH)

## 2020-07-23 LAB — GLUCOSE, CAPILLARY
Glucose-Capillary: 228 mg/dL — ABNORMAL HIGH (ref 70–99)
Glucose-Capillary: 277 mg/dL — ABNORMAL HIGH (ref 70–99)
Glucose-Capillary: 92 mg/dL (ref 70–99)
Glucose-Capillary: 147 mg/dL — ABNORMAL HIGH (ref 70–99)
Glucose-Capillary: 144 mg/dL — ABNORMAL HIGH (ref 70–99)
Glucose-Capillary: 229 mg/dL — ABNORMAL HIGH (ref 70–99)

## 2020-07-23 LAB — HEPARIN LEVEL (UNFRACTIONATED)
Heparin Unfractionated: 0.21 IU/mL — ABNORMAL LOW (ref 0.30–0.70)
Heparin Unfractionated: 0.24 IU/mL — ABNORMAL LOW (ref 0.30–0.70)

## 2020-07-23 MED ORDER — SODIUM CHLORIDE 0.9 % IV SOLN
1.5000 g | INTRAVENOUS | Status: AC
  Administered 2020-07-24: 1.5 g via INTRAVENOUS
  Filled 2020-07-23: qty 1.5

## 2020-07-23 MED ORDER — CHLORHEXIDINE GLUCONATE CLOTH 2 % EX PADS
6.0000 | MEDICATED_PAD | Freq: Once | CUTANEOUS | Status: AC
  Administered 2020-07-24: 6 via TOPICAL

## 2020-07-23 MED ORDER — NITROGLYCERIN IN D5W 200-5 MCG/ML-% IV SOLN
2.0000 ug/min | INTRAVENOUS | Status: AC
  Administered 2020-07-24: 5 ug/min via INTRAVENOUS
  Filled 2020-07-23: qty 250

## 2020-07-23 MED ORDER — EPINEPHRINE HCL 5 MG/250ML IV SOLN IN NS
0.0000 ug/min | INTRAVENOUS | Status: DC
  Filled 2020-07-23: qty 250

## 2020-07-23 MED ORDER — CHLORHEXIDINE GLUCONATE CLOTH 2 % EX PADS
6.0000 | MEDICATED_PAD | Freq: Once | CUTANEOUS | Status: AC
  Administered 2020-07-23: 6 via TOPICAL

## 2020-07-23 MED ORDER — MILRINONE LACTATE IN DEXTROSE 20-5 MG/100ML-% IV SOLN
0.3000 ug/kg/min | INTRAVENOUS | Status: DC
  Filled 2020-07-23: qty 100

## 2020-07-23 MED ORDER — OXYCODONE HCL 5 MG PO TABS
15.0000 mg | ORAL_TABLET | Freq: Four times a day (QID) | ORAL | Status: DC | PRN
  Administered 2020-07-23: 15 mg via ORAL
  Filled 2020-07-23: qty 3

## 2020-07-23 MED ORDER — PLASMA-LYTE 148 IV SOLN
INTRAVENOUS | Status: DC
  Filled 2020-07-23: qty 2.5

## 2020-07-23 MED ORDER — NOREPINEPHRINE 4 MG/250ML-% IV SOLN
0.0000 ug/min | INTRAVENOUS | Status: AC
  Administered 2020-07-24: 2 ug/min via INTRAVENOUS
  Filled 2020-07-23: qty 250

## 2020-07-23 MED ORDER — HEPARIN BOLUS VIA INFUSION
3700.0000 [IU] | Freq: Once | INTRAVENOUS | Status: AC
  Administered 2020-07-23: 3700 [IU] via INTRAVENOUS
  Filled 2020-07-23: qty 3700

## 2020-07-23 MED ORDER — POTASSIUM CHLORIDE 2 MEQ/ML IV SOLN
80.0000 meq | INTRAVENOUS | Status: DC
  Filled 2020-07-23: qty 40

## 2020-07-23 MED ORDER — SODIUM CHLORIDE 0.9 % IV SOLN
750.0000 mg | INTRAVENOUS | Status: AC
  Administered 2020-07-24: 750 mg via INTRAVENOUS
  Filled 2020-07-23: qty 750

## 2020-07-23 MED ORDER — TRANEXAMIC ACID (OHS) PUMP PRIME SOLUTION
2.0000 mg/kg | INTRAVENOUS | Status: DC
  Filled 2020-07-23: qty 3.39

## 2020-07-23 MED ORDER — TRANEXAMIC ACID 1000 MG/10ML IV SOLN
1.5000 mg/kg/h | INTRAVENOUS | Status: AC
  Administered 2020-07-24: 1.5 mg/kg/h via INTRAVENOUS
  Filled 2020-07-23 (×2): qty 25

## 2020-07-23 MED ORDER — CHLORHEXIDINE GLUCONATE 0.12 % MT SOLN
15.0000 mL | Freq: Once | OROMUCOSAL | Status: AC
  Administered 2020-07-24: 15 mL via OROMUCOSAL
  Filled 2020-07-23: qty 15

## 2020-07-23 MED ORDER — BISACODYL 5 MG PO TBEC: 5.0000 mg | DELAYED_RELEASE_TABLET | Freq: Once | ORAL | Status: DC

## 2020-07-23 MED ORDER — METOPROLOL TARTRATE 12.5 MG HALF TABLET
12.5000 mg | ORAL_TABLET | Freq: Once | ORAL | Status: AC
  Administered 2020-07-24: 12.5 mg via ORAL
  Filled 2020-07-23: qty 1

## 2020-07-23 MED ORDER — DEXMEDETOMIDINE HCL IN NACL 400 MCG/100ML IV SOLN
0.1000 ug/kg/h | INTRAVENOUS | Status: AC
  Administered 2020-07-24: .3 ug/kg/h via INTRAVENOUS
  Filled 2020-07-23: qty 100

## 2020-07-23 MED ORDER — TRANEXAMIC ACID (OHS) BOLUS VIA INFUSION
15.0000 mg/kg | INTRAVENOUS | Status: AC
  Administered 2020-07-24: 2539.5 mg via INTRAVENOUS
  Filled 2020-07-23: qty 2540

## 2020-07-23 MED ORDER — PHENYLEPHRINE HCL-NACL 20-0.9 MG/250ML-% IV SOLN
30.0000 ug/min | INTRAVENOUS | Status: AC
  Administered 2020-07-24: 25 ug/min via INTRAVENOUS
  Filled 2020-07-23: qty 250

## 2020-07-23 MED ORDER — MANNITOL 20 % IV SOLN
INTRAVENOUS | Status: DC
  Filled 2020-07-23: qty 13

## 2020-07-23 MED ORDER — TEMAZEPAM 15 MG PO CAPS: 15.0000 mg | ORAL_CAPSULE | Freq: Once | ORAL | Status: DC | PRN

## 2020-07-23 MED ORDER — SODIUM CHLORIDE 0.9 % IV SOLN
INTRAVENOUS | Status: DC
  Filled 2020-07-23: qty 30

## 2020-07-23 MED ORDER — INSULIN REGULAR(HUMAN) IN NACL 100-0.9 UT/100ML-% IV SOLN
INTRAVENOUS | Status: AC
  Administered 2020-07-24: 2.2 [IU]/h via INTRAVENOUS
  Filled 2020-07-23: qty 100

## 2020-07-23 MED ORDER — VANCOMYCIN HCL 1500 MG/300ML IV SOLN
1500.0000 mg | INTRAVENOUS | Status: AC
  Administered 2020-07-24: 1500 mg via INTRAVENOUS
  Filled 2020-07-23: qty 300

## 2020-07-23 NOTE — Progress Notes (Signed)
Order for informed consent lists procedure as "coronary artery disease". Notified on call cardiothoracic physician who verbally clarified that the procedure is supposed to be written as "coronary artery bypass graft". Consent will be obtained with this procedure listed.

## 2020-07-23 NOTE — Progress Notes (Signed)
PROGRESS NOTE    Joseph [name]  DGU:[id number] DOB: 10-21-1971 DOA: 07/17/2020 PCP: Marva Panda, NP   Brief Narrative:   Joseph [name] is an 49 y.o. male past medical history of CAD status post stent to the RCA in June 2020, essential hypertension PE/DVT on Xarelto and COPD who comes into the hospital with 1 day onset of weakness and lightheadedness, he relates he was at home took his blood pressure and it was 79/50 he was also short of breath with exertion.  In the ED his blood pressure was 77/64 troponins are 553, BUN of 59 creatinine of 3.5 (with a baseline of 1.2-1.3), was found to be septic started empirically on antibiotics fluid resuscitated cardiology was consulted and as he was minimizing his symptoms are charted avoid for hospitalization and further work-up cardiology recommended ischemic work-up he was given aspirin IV fluid and started on IV heparin.  07/23/20: Possible CABG next week. Continue holding plavix.   Assessment & Plan:  Severe sepsis secondary to lower extremity cellulitis     - Rocephin and doxycycline     - Bld Cx NTD, MRSA PCR neg       - Dopplers negative for DVT.     - afebrile, WBC good, continue abx  Acute kidney injury:     - In the setting of sepsis ACE inhibitor and diuretic use.     - SCr 3.5 at admission, baseline 1.2 - 1.3     - Scr is 1.63 this AM; remains on lasix, follow.  Atypical chest pain with a history of CAD Multivessel CAD     - Cardiology onboard     - He was started on IV heparin, Plavix; plavix now held d/t LHC findings and      - Echo (07/18/20): Left Ventricle: Left ventricular ejection fraction, by estimation, is 50 to 55%. The left ventricle has low normal function. Left ventricular endocardial border not optimally defined to evaluate regional wall motion. Definity contrast agent was given IV to delineate the left ventricular endocardial borders. The left ventricular internal cavity size was normal in size. There is mild left  ventricular hypertrophy. Left ventricular diastolic parameters were normal.     - LHC: Severe distal LEFT MAIN 70% stenosis (highly positive DFR 0.85 and proximal LAD) Relatively normal LAD with minimal disease, 1st & 3rd Diag having proximal 50 to 60% stenosis. Diffuse 55% calcified proximal to mid LCx with a focal 60% stenosis just prior to overlapping VISION BMS, and RESOLUTE ONYX DES in the main LCx prior to bifurcation. Minimal RCA disease with roughly 20% ISR in the stented segment of the RCA.  Ostial PDA 50-60%     - CTS consulted, rec'd CABG, appreciate assistance.     - lipitor, fenofibrate, coreg  Morbid obesity Obstructive sleep apnea:     - recommend CPAP     - pt noncompliant with CPAP at home; will need to follow-up with pulmonology as an outpatient.     - refusing CPAP at night  History of DVT and PE:     - Lower extremity DVT was negative, VQ scan without PE.     - His Xarelto was held he was started on IV heparin for possible unstable angina.     - After cardiac cath can probably switch back to Xarelto.  Acute normocytic anemia B12 deficinecy THF deficiency Fe deficiency     - B12, THF, Fe replacement     - Outpt follow up  Chronic  pain syndrome:     - PRN oxy  DMt2 uncontrolled     - A1c of 9.4     - Lantus, SSI, DM diet  Tobacco abuse     - counseled against further use  COPD     - Follow-up with pulmonary with PFTs and sleep studies.  DVT prophylaxis: heparin gtt Code Status: FULL Family Communication: None at bedside   Status is: Inpatient  Remains inpatient appropriate because:Inpatient level of care appropriate due to severity of illness   Dispo: The patient is from: Home              Anticipated d/c is to: Home              Anticipated d/c date is: > 3 days              Patient currently is not medically stable to d/c.  Consultants:   Cardiology  CTS  Procedures:   LHC  Antimicrobials:  . Rocephin . Doxy   ROS:  Denis CP,  N, V, ab pain. Remainder ROS is negative for all not previously mentioned.  Subjective: "They told me all about that."  Objective: Vitals:   07/22/20 1954 07/23/20 0045 07/23/20 0438 07/23/20 0500  BP: (!) 144/119 126/81 139/80   Pulse: 69 70 63   Resp: 16 16 16    Temp: 98.1 F (36.7 C) 98.1 F (36.7 C) 97.6 F (36.4 C)   TempSrc: Oral Oral Oral   SpO2: 97% 99% (!) 88%   Weight:  (!) 169.3 kg  (!) 169.3 kg  Height:        Intake/Output Summary (Last 24 hours) at 07/23/2020 0711 Last data filed at 07/23/2020 0500 Gross per 24 hour  Intake 1440.03 ml  Output 1750 ml  Net -309.97 ml   Filed Weights   07/22/20 0140 07/23/20 0045 07/23/20 0500  Weight: (!) 170.8 kg (!) 169.3 kg (!) 169.3 kg    Examination:  General: 49 y.o. male resting in bed in NAD Cardiovascular: RRR, +S1, S2, no m/g/r, equal pulses throughout Respiratory: CTABL, no w/r/r, normal WOB GI: BS+, NDNT, no masses noted, no organomegaly noted MSK: No c/c; LLE erythema/edema improving Neuro: A&O x 3, no focal deficits Psyc: Appropriate interaction and affect, calm/cooperative   Data Reviewed: I have personally reviewed following labs and imaging studies.  CBC: Recent Labs  Lab 07/18/20 0100 07/18/20 0100 07/18/20 0243 07/19/20 0459 07/20/20 0624 07/21/20 0557 07/22/20 0802  WBC 13.2*  --  13.8* 8.7 8.8 8.7  --   NEUTROABS 10.2*  --   --   --   --   --   --   HGB 10.0*   < > 10.4* 10.1* 9.8* 9.7* 10.5*  HCT 31.9*   < > 33.8* 31.8* 32.4* 32.3* 31.0*  MCV 94.4  --  97.4 93.3 93.9 94.2  --   PLT 204  --  216 237 259 279  --    < > = values in this interval not displayed.   Basic Metabolic Panel: Recent Labs  Lab 07/18/20 0100 07/18/20 0100 07/19/20 0459 07/20/20 0624 07/21/20 0557 07/22/20 0802 07/22/20 0952  NA 134*   < > 138 139 141 144 139  K 5.3*   < > 5.0 5.5* 4.8 4.5 4.4  CL 107   < > 109 108 110 108 106  CO2 20*  --  21* 22 24  --  23  GLUCOSE 281*   < > 259* 311*  222* 109* 90  BUN  59*   < > 53* 45* 36* 27* 27*  CREATININE 3.53*   < > 2.26* 1.86* 1.61* 1.50* 1.38*  CALCIUM 7.7*  --  8.2* 8.6* 8.7*  --  8.9  MG 1.4*  --   --   --   --   --   --   PHOS 3.9  --   --   --  2.2*  --   --    < > = values in this interval not displayed.   GFR: Estimated Creatinine Clearance: 113.4 mL/min (A) (by C-G formula based on SCr of 1.38 mg/dL (H)). Liver Function Tests: Recent Labs  Lab 07/19/20 0459 07/21/20 0557  AST 13*  --   ALT 15  --   ALKPHOS 48  --   BILITOT 0.4  --   PROT 6.6  --   ALBUMIN 3.0* 3.0*   No results for input(s): LIPASE, AMYLASE in the last 168 hours. No results for input(s): AMMONIA in the last 168 hours. Coagulation Profile: Recent Labs  Lab 07/19/20 0459  INR 1.3*   Cardiac Enzymes: No results for input(s): CKTOTAL, CKMB, CKMBINDEX, TROPONINI in the last 168 hours. BNP (last 3 results) No results for input(s): PROBNP in the last 8760 hours. HbA1C: No results for input(s): HGBA1C in the last 72 hours. CBG: Recent Labs  Lab 07/22/20 0559 07/22/20 1138 07/22/20 1745 07/22/20 2113 07/23/20 0602  GLUCAP 133*  133* 111* 130* 202* 92   Lipid Profile: No results for input(s): CHOL, HDL, LDLCALC, TRIG, CHOLHDL, LDLDIRECT in the last 72 hours. Thyroid Function Tests: No results for input(s): TSH, T4TOTAL, FREET4, T3FREE, THYROIDAB in the last 72 hours. Anemia Panel: No results for input(s): VITAMINB12, FOLATE, FERRITIN, TIBC, IRON, RETICCTPCT in the last 72 hours. Sepsis Labs: Recent Labs  Lab 07/18/20 0100  LATICACIDVEN 1.5    Recent Results (from the past 240 hour(s))  SARS Coronavirus 2 by RT PCR (hospital order, [facility]) Nasopharyngeal Nasopharyngeal Swab     Status: None   Collection Time: 07/18/20  2:27 AM   Specimen: Nasopharyngeal Swab  Result Value Ref Range Status   SARS Coronavirus 2 NEGATIVE NEGATIVE Final    Comment: (NOTE) SARS-CoV-2 target nucleic acids are NOT DETECTED.  The  SARS-CoV-2 RNA is generally detectable in upper and lower respiratory specimens during the acute phase of infection. The lowest concentration of SARS-CoV-2 viral copies this assay can detect is 250 copies / mL. A negative result does not preclude SARS-CoV-2 infection and should not be used as the sole basis for treatment or other patient management decisions.  A negative result may occur with improper specimen collection / handling, submission of specimen other than nasopharyngeal swab, presence of viral mutation(s) within the areas targeted by this assay, and inadequate number of viral copies (<250 copies / mL). A negative result must be combined with clinical observations, patient history, and epidemiological information.  Fact Sheet for Patients:   BoilerBrush.com.cyhttps://www.fda.gov/media/136312/download  Fact Sheet for Healthcare Providers: https://pope.com/https://www.fda.gov/media/136313/download  This test is not yet approved or  cleared by the [place] FDA and has been authorized for detection and/or diagnosis of SARS-CoV-2 by FDA under an Emergency Use Authorization (EUA).  This EUA will remain in effect (meaning this test can be used) for the duration of the COVID-19 declaration under Section 564(b)(1) of the Act, 21 U.S.C. section 360bbb-3(b)(1), unless the authorization is terminated or revoked sooner.  Performed at [facility], [phone]  [address] [place], [name] [address]   Culture, blood (routine x 2)     Status: None (Preliminary result)   Collection Time: 07/18/20 10:50 AM   Specimen: BLOOD RIGHT HAND  Result Value Ref Range Status   Specimen Description BLOOD RIGHT HAND  Final   Special Requests   Final    BOTTLES DRAWN AEROBIC AND ANAEROBIC Blood Culture results may not be optimal due to an inadequate volume of blood received in culture bottles   Culture   Final    NO GROWTH 4 DAYS Performed at [facility], [address] [place], [name] [address]    Report Status PENDING  Incomplete    Culture, blood (routine x 2)     Status: None (Preliminary result)   Collection Time: 07/18/20 10:50 AM   Specimen: BLOOD LEFT HAND  Result Value Ref Range Status   Specimen Description BLOOD LEFT HAND  Final   Special Requests   Final    BOTTLES DRAWN AEROBIC AND ANAEROBIC Blood Culture adequate volume   Culture   Final    NO GROWTH 4 DAYS Performed at [facility], [address] [place], [name] [address]    Report Status PENDING  Incomplete  MRSA PCR Screening     Status: None   Collection Time: 07/18/20  4:12 PM   Specimen: Nasal Mucosa; Nasopharyngeal  Result Value Ref Range Status   MRSA by PCR NEGATIVE NEGATIVE Final    Comment:        The GeneXpert MRSA Assay (FDA approved for NASAL specimens only), is one component of a comprehensive MRSA colonization surveillance program. It is not intended to diagnose MRSA infection nor to guide or monitor treatment for MRSA infections. Performed at [facility], [address], [place], [name] [address]       Radiology Studies: CARDIAC CATHETERIZATION  Result Date: 07/22/2020  LV end diastolic pressure is severely elevated -consistent with ACUTE DIASTOLIC HEART FAILURE  Mid LM to Ost LAD lesion is 70% stenosed with 55% stenosed side branch in Ost Cx to Mid Cx.  Mid Cx-1 lesion is 60% stenosed with 50% stenosed side branch in 1st Mrg.  Mid Cx-2 overlapping BMS and DES stent segment is 5% stenosed.  1st Diag lesion is 60% stenosed. 3rd Diag lesion is 60% stenosed.  Prox RCA to Mid RCA stented segment is 25% stenosed.  Dist RCA lesion is 50% stenosed with 65% stenosed side branch in RPDA.  -------------------------------------------- \SUMMARY  Severe distal LEFT MAIN 70% stenosis (highly positive DFR 0.85 and proximal LAD)  Relatively normal LAD with minimal disease, 1st & 3rd Diag having proximal 50 to 60% stenosis.  Diffuse 55% calcified proximal to mid LCx with a focal 60% stenosis just prior to overlapping VISION BMS, and  RESOLUTE ONYX DES in the main LCx prior to bifurcation.  Minimal RCA disease with roughly 20% ISR in the stented segment of the RCA.  Ostial PDA 50-60%  LV gram not performed due to severely elevated LVEDP of 33 mmHg -> consistent with ACUTE DIASTOLIC HEART FAILURE (will need echocardiogram to assess EF) RECOMMENDATIONS:  With severely LM and proximal LCx disease, will refer for CVTS consultation. -->  Plavix has been discontinued  He would likely need several days for Plavix washout -> he will also need aggressive blood pressure management along with diuresis.  I have ordered Lasix 40 mg IV twice daily starting this evening.  Restart IV heparin 8 hours after sheath removal.  Continue aggressive CRF Guideline Directed Therapy.  Bryan Lemma, MD    Scheduled Meds: . aspirin EC  81 mg Oral Daily  . atorvastatin  80 mg Oral q1800  . buPROPion  150 mg Oral BID  . carvedilol  3.125 mg Oral BID  . Chlorhexidine Gluconate Cloth  6 each Topical Daily  . doxycycline  100 mg Oral BID  . [START ON 07/24/2020] epinephrine  0-10 mcg/min Intravenous To OR  . escitalopram  10 mg Oral Daily  . fenofibrate  54 mg Oral Daily  . ferrous sulfate  325 mg Oral Q breakfast  . fluconazole  100 mg Oral Q Mon  . folic acid  1 mg Oral Daily  . furosemide  40 mg Intravenous BID  . [START ON 07/24/2020] heparin-papaverine-plasmalyte irrigation   Irrigation To OR  . insulin aspart  0-15 Units Subcutaneous TID WC  . insulin aspart  0-5 Units Subcutaneous QHS  . insulin glargine  40 Units Subcutaneous BID  . [START ON 07/24/2020] insulin   Intravenous To OR  . [START ON 07/24/2020] [place] Blood Cardioplegia vial (lidocaine/magnesium/mannitol 0.26g-4g-6.4g)   Intracoronary To OR  . pantoprazole  40 mg Oral Daily  . [START ON 07/24/2020] phenylephrine  30-200 mcg/min Intravenous To OR  . [START ON 07/24/2020] potassium chloride  80 mEq Other To OR  . pregabalin  150 mg Oral TID  . senna-docusate  2 tablet Oral BID  .  sodium chloride flush  3 mL Intravenous Q12H  . [START ON 07/24/2020] tranexamic acid  15 mg/kg Intravenous To OR  . [START ON 07/24/2020] tranexamic acid  2 mg/kg Intracatheter To OR   Continuous Infusions: . sodium chloride    . cefTRIAXone (ROCEPHIN)  IV 2 g (07/22/20 1002)  . [START ON 07/24/2020] cefUROXime (ZINACEF)  IV    . [START ON 07/24/2020] cefUROXime (ZINACEF)  IV    . [START ON 07/24/2020] dexmedetomidine    . [START ON 07/24/2020] heparin 30,000 units/NS 1000 mL solution for CELLSAVER    . heparin 2,750 Units/hr (07/23/20 0300)  . [START ON 07/24/2020] milrinone    . [START ON 07/24/2020] nitroGLYCERIN    . [START ON 07/24/2020] norepinephrine    . [START ON 07/24/2020] tranexamic acid (CYKLOKAPRON) infusion ([facility])    . [START ON 07/24/2020] vancomycin       LOS: 4 days    Time spent: 25 minutes spent in the coordination of care today.    Teddy Spike, DO [facility]  If 7PM-7AM, please contact night-coverage [email or web address] 07/23/2020, 7:11 AM

## 2020-07-23 NOTE — Progress Notes (Signed)
Progress Note  Patient Name: Gwynneth Albrighterry W Lac Date of Encounter: 07/23/2020  [facility] Cardiologist: Chilton Siiffany Whitecone, MD   Subjective   No CP  Breathing OK    Inpatient Medications    Scheduled Meds: . aspirin EC  81 mg Oral Daily  . atorvastatin  80 mg Oral q1800  . buPROPion  150 mg Oral BID  . carvedilol  3.125 mg Oral BID  . Chlorhexidine Gluconate Cloth  6 each Topical Daily  . doxycycline  100 mg Oral BID  . [START ON 07/24/2020] epinephrine  0-10 mcg/min Intravenous To OR  . escitalopram  10 mg Oral Daily  . fenofibrate  54 mg Oral Daily  . ferrous sulfate  325 mg Oral Q breakfast  . fluconazole  100 mg Oral Q Mon  . folic acid  1 mg Oral Daily  . furosemide  40 mg Intravenous BID  . [START ON 07/24/2020] heparin-papaverine-plasmalyte irrigation   Irrigation To OR  . insulin aspart  0-15 Units Subcutaneous TID WC  . insulin aspart  0-5 Units Subcutaneous QHS  . insulin glargine  40 Units Subcutaneous BID  . [START ON 07/24/2020] insulin   Intravenous To OR  . [START ON 07/24/2020] [place] Blood Cardioplegia vial (lidocaine/magnesium/mannitol 0.26g-4g-6.4g)   Intracoronary To OR  . pantoprazole  40 mg Oral Daily  . [START ON 07/24/2020] phenylephrine  30-200 mcg/min Intravenous To OR  . [START ON 07/24/2020] potassium chloride  80 mEq Other To OR  . pregabalin  150 mg Oral TID  . senna-docusate  2 tablet Oral BID  . sodium chloride flush  3 mL Intravenous Q12H  . [START ON 07/24/2020] tranexamic acid  15 mg/kg Intravenous To OR  . [START ON 07/24/2020] tranexamic acid  2 mg/kg Intracatheter To OR   Continuous Infusions: . sodium chloride    . cefTRIAXone (ROCEPHIN)  IV 2 g (07/22/20 1002)  . [START ON 07/24/2020] cefUROXime (ZINACEF)  IV    . [START ON 07/24/2020] cefUROXime (ZINACEF)  IV    . [START ON 07/24/2020] dexmedetomidine    . [START ON 07/24/2020] heparin 30,000 units/NS 1000 mL solution for CELLSAVER    . heparin 2,750 Units/hr (07/23/20 0300)  . [START ON  07/24/2020] milrinone    . [START ON 07/24/2020] nitroGLYCERIN    . [START ON 07/24/2020] norepinephrine    . [START ON 07/24/2020] tranexamic acid (CYKLOKAPRON) infusion ([facility])    . [START ON 07/24/2020] vancomycin     PRN Meds: sodium chloride, acetaminophen, albuterol, ALPRAZolam, fluticasone, ondansetron (ZOFRAN) IV, oxyCODONE, sodium chloride flush   Vital Signs    Vitals:   07/22/20 1954 07/23/20 0045 07/23/20 0438 07/23/20 0500  BP: (!) 144/119 126/81 139/80   Pulse: 69 70 63   Resp: 16 16 16    Temp: 98.1 F (36.7 C) 98.1 F (36.7 C) 97.6 F (36.4 C)   TempSrc: Oral Oral Oral   SpO2: 97% 99% (!) 88%   Weight:  (!) 169.3 kg  (!) 169.3 kg  Height:        Intake/Output Summary (Last 24 hours) at 07/23/2020 0710 Last data filed at 07/23/2020 0500 Gross per 24 hour  Intake 1440.03 ml  Output 1750 ml  Net -309.97 ml   Last 3 Weights 07/23/2020 07/23/2020 07/22/2020  Weight (lbs) 373 lb 3.8 oz 373 lb 4.8 oz 376 lb 9.6 oz  Weight (kg) 169.3 kg 169.328 kg 170.825 kg      Telemetry   SR - Personally Reviewed  ECG  No new EKG - Personally Reviewed  Physical Exam  Morbidly obese 49 yo  GEN: No acute distress.   Neck: NO obvious JVD Cardiac: RRR, no murmurs, rubs, or gallops.  Respiratory: Clear to auscultation bilaterally. GI: Soft, nontender, non-distended  MS  T4 to 1+ edema;  S/p R gr toe amputation    Mild erythema L foot, shin   L 4th tow red, gangranous tip     Labs    High Sensitivity Troponin:   Recent Labs  Lab 07/18/20 0100 07/18/20 0243 07/19/20 0459  TROPONINIHS 553* 528* 389*      Chemistry Recent Labs  Lab 07/19/20 0459 07/19/20 0459 07/20/20 0624 07/20/20 0624 07/21/20 0557 07/22/20 0802 07/22/20 0952  NA 138   < > 139   < > 141 144 139  K 5.0   < > 5.5*   < > 4.8 4.5 4.4  CL 109   < > 108   < > 110 108 106  CO2 21*   < > 22  --  24  --  23  GLUCOSE 259*   < > 311*   < > 222* 109* 90  BUN 53*   < > 45*   < > 36* 27* 27*  CREATININE 2.26*   < >  1.86*   < > 1.61* 1.50* 1.38*  CALCIUM 8.2*   < > 8.6*  --  8.7*  --  8.9  PROT 6.6  --   --   --   --   --   --   ALBUMIN 3.0*  --   --   --  3.0*  --   --   AST 13*  --   --   --   --   --   --   ALT 15  --   --   --   --   --   --   ALKPHOS 48  --   --   --   --   --   --   BILITOT 0.4  --   --   --   --   --   --   GFRNONAA 33*   < > 42*  --  50*  --  >60  GFRAA 39*   < > 49*  --  58*  --  >60  ANIONGAP 8   < > 9  --  7  --  10   < > = values in this interval not displayed.     Hematology Recent Labs  Lab 07/19/20 0459 07/19/20 0459 07/20/20 0624 07/21/20 0557 07/22/20 0802  WBC 8.7  --  8.8 8.7  --   RBC 3.41*  --  3.45* 3.43*  --   HGB 10.1*   < > 9.8* 9.7* 10.5*  HCT 31.8*   < > 32.4* 32.3* 31.0*  MCV 93.3  --  93.9 94.2  --   MCH 29.6  --  28.4 28.3  --   MCHC 31.8  --  30.2 30.0  --   RDW 13.6  --  13.6 13.5  --   PLT 237  --  259 279  --    < > = values in this interval not displayed.    BNP Recent Labs  Lab 07/18/20 0100  BNP 199.0*     DDimer No results for input(s): DDIMER in the last 168 hours.   Radiology    CARDIAC CATHETERIZATION  Result Date: 07/22/2020  LV end diastolic  pressure is severely elevated -consistent with ACUTE DIASTOLIC HEART FAILURE  Mid LM to Ost LAD lesion is 70% stenosed with 55% stenosed side branch in Ost Cx to Mid Cx.  Mid Cx-1 lesion is 60% stenosed with 50% stenosed side branch in 1st Mrg.  Mid Cx-2 overlapping BMS and DES stent segment is 5% stenosed.  1st Diag lesion is 60% stenosed. 3rd Diag lesion is 60% stenosed.  Prox RCA to Mid RCA stented segment is 25% stenosed.  Dist RCA lesion is 50% stenosed with 65% stenosed side branch in RPDA.  -------------------------------------------- \SUMMARY  Severe distal LEFT MAIN 70% stenosis (highly positive DFR 0.85 and proximal LAD)  Relatively normal LAD with minimal disease, 1st & 3rd Diag having proximal 50 to 60% stenosis.  Diffuse 55% calcified proximal to mid LCx with a  focal 60% stenosis just prior to overlapping VISION BMS, and RESOLUTE ONYX DES in the main LCx prior to bifurcation.  Minimal RCA disease with roughly 20% ISR in the stented segment of the RCA.  Ostial PDA 50-60%  LV gram not performed due to severely elevated LVEDP of 33 mmHg -> consistent with ACUTE DIASTOLIC HEART FAILURE (will need echocardiogram to assess EF) RECOMMENDATIONS:  With severely LM and proximal LCx disease, will refer for CVTS consultation. -->  Plavix has been discontinued  He would likely need several days for Plavix washout -> he will also need aggressive blood pressure management along with diuresis.  I have ordered Lasix 40 mg IV twice daily starting this evening.  Restart IV heparin 8 hours after sheath removal.  Continue aggressive CRF Guideline Directed Therapy. Bryan Lemma, MD   Cardiac Studies   Cardiac Cath 07/22/20   LV end diastolic pressure is severely elevated -consistent with ACUTE DIASTOLIC HEART FAILURE  Mid LM to Ost LAD lesion is 70% stenosed with 55% stenosed side branch in Ost Cx to Mid Cx.  Mid Cx-1 lesion is 60% stenosed with 50% stenosed side branch in 1st Mrg.  Mid Cx-2 overlapping BMS and DES stent segment is 5% stenosed.  1st Diag lesion is 60% stenosed. 3rd Diag lesion is 60% stenosed.  Prox RCA to Mid RCA stented segment is 25% stenosed.  Dist RCA lesion is 50% stenosed with 65% stenosed side branch in RPDA.   -------------------------------------------- \SUMMARY  Severe distal LEFT MAIN 70% stenosis (highly positive DFR 0.85 and proximal LAD) ? Relatively normal LAD with minimal disease, 1st & 3rd Diag having proximal 50 to 60% stenosis. ? Diffuse 55% calcified proximal to mid LCx with a focal 60% stenosis just prior to overlapping VISION BMS, and RESOLUTE ONYX DES in the main LCx prior to bifurcation.  Minimal RCA disease with roughly 20% ISR in the stented segment of the RCA.  Ostial PDA 50-60%  LV gram not performed due to  severely elevated LVEDP of 33 mmHg -> consistent with ACUTE DIASTOLIC HEART FAILURE (will need echocardiogram to assess EF)   RECOMMENDATIONS:  With severely LM and proximal LCx disease, will refer for CVTS consultation. -->  Plavix has been discontinued  He would likely need several days for Plavix washout -> he will also need aggressive blood pressure management along with diuresis. ? I have ordered Lasix 40 mg IV twice daily starting this evening.  Restart IV heparin 8 hours after sheath removal.  Continue aggressive CRF Guideline Directed Therapy.  ECHO   07/18/20  1. Left ventricular ejection fraction, by estimation, is 50 to 55%. The left ventricle has low normal function. Left ventricular  endocardial border not optimally defined to evaluate regional wall motion. There is mild left ventricular hypertrophy. Left ventricular diastolic parameters were normal. 2. Right ventricular systolic function is normal. The right ventricular size is normal. Tricuspid regurgitation signal is inadequate for assessing PA pressure. 3. The mitral valve is grossly normal. No evidence of mitral valve regurgitation. 4. The aortic valve was not well visualized. Aortic valve regurgitation is not visualized. 5. The inferior vena cava is normal in size with greater than 50% respiratory variability, suggesting right atrial pressure of 3 mmHg.  Patient Profile     Jevaun Strick Chriscois a 49 y.o.malewith past medical history of CAD (s/p BMS to LCx in 2017, DES to distal RCA in 05/2019, repeat cath in 10/2019 with DES to distal LCx with patent stents along RCA and mid-LCx), history of DVT/PE (on Xarelto), HTN, HLD and tobacco usewho is being seen today for the evaluation ofchest pain and elevated troponin valuesat the request of Dr. Thomes Dinning.  Assessment & Plan    1  CAD   Pt has had several stents in past  Admitted with CP, NSTEMI    Cath as noted above   He has been evaluated by TCTS  Plan for CABG  once platelet reativity OK    2 Sepsis   Due to cellulites    REcovering   ON ABX  3   Renal    Cr down to 1.63  4  Hx DVT/PE   On heparin  5  DM with neuropathy   Remias on insulin here    6  HL  Keep on statin     For questions or updates, please contact [facility] Please consult [email or web address] for contact info under        Signed, Dietrich Pates, MD  07/23/2020, 7:10 AM

## 2020-07-23 NOTE — Progress Notes (Signed)
ANTICOAGULATION CONSULT NOTE - Follow Up Consult  Pharmacy Consult for Heparin Indication: chest pain/ACS  Allergies  Allergen Reactions  . Sulfa Antibiotics Other (See Comments)    headache    Patient Measurements: Height: 6\' 5"  (195.6 cm) Weight: (!) 169.3 kg (373 lb 3.8 oz) IBW/kg (Calculated) : 89.1 Heparin Dosing Weight: 125 kg  Vital Signs: Temp: 98.1 F (36.7 C) (08/04 1127) Temp Source: Oral (08/04 1127) BP: 135/81 (08/04 1127) Pulse Rate: 71 (08/04 1127)  Labs: Recent Labs    07/21/20 0557 07/21/20 0557 07/21/20 1446 07/21/20 2055 07/22/20 0802 07/22/20 0952 07/23/20 0606 07/23/20 1135  HGB 9.7*   < >  --   --  10.5*  --  10.5*  --   HCT 32.3*  --   --   --  31.0*  --  33.3*  --   PLT 279  --   --   --   --   --  300  --   HEPARINUNFRC 0.24*   < > 0.36 0.46  --   --   --  0.21*  CREATININE 1.61*   < >  --   --  1.50* 1.38* 1.63*  --    < > = values in this interval not displayed.    Estimated Creatinine Clearance: 96 mL/min (A) (by C-G formula based on SCr of 1.63 mg/dL (H)).  Assessment:  49 yo male presented with CP to start IV heparin for ACS. Note that patient takes Xarelto for hx DVT/PE, reports last 6-7 years ago. Last Xarelto 20 mg dose 7/29 am. VQ scan negative for PE and duplex negative DVT on 7/30. HL and APTT correlating on 8/2 so just using heparin levels now.     s/p cath 8/3 showing 3V CAD. TCTS consulted and CABG scheduled for 8/5.  Last Plavix dose 8/3 am. P2Y12 254.    Heparin drip resumed 8/3 pm at 2750 units/hr. Previously therapeutic on this rate but subtherapeutic (0.21) today.  Patient reports no infusion interruptions.   Goal of Therapy:  Heparin level 0.3-0.7 units/ml Monitor platelets by anticoagulation protocol: Yes   Plan:   Increase heparin drip to 2950 units/hr  Heparin level ~6 hrs after rate change.  Daily heparin level and CBC while on heparin.  CABG scheduled for 8/5.  Xarelto on hold.  10/5,  Dennie Fetters Phone: [phone] 07/23/2020,1:03 PM

## 2020-07-23 NOTE — Anesthesia Preprocedure Evaluation (Addendum)
Anesthesia Evaluation  Patient identified by MRN, date of birth, ID band Patient awake    Reviewed: Allergy & Precautions, NPO status , Patient's Chart, lab work & pertinent test results  Airway Mallampati: II  TM Distance: >3 FB     Dental   Pulmonary asthma , sleep apnea , pneumonia, COPD, Current Smoker,    breath sounds clear to auscultation       Cardiovascular hypertension, + angina + CAD and + Past MI   Rhythm:Regular Rate:Normal     Neuro/Psych PSYCHIATRIC DISORDERS Anxiety Depression  Neuromuscular disease    GI/Hepatic Neg liver ROS, GERD  ,  Endo/Other  diabetes  Renal/GU Renal disease     Musculoskeletal  (+) Arthritis ,   Abdominal   Peds  Hematology   Anesthesia Other Findings   Reproductive/Obstetrics                            Anesthesia Physical Anesthesia Plan  ASA: III  Anesthesia Plan: General   Post-op Pain Management:    Induction: Intravenous  PONV Risk Score and Plan: 2 and Ondansetron, Dexamethasone and Midazolam  Airway Management Planned: Oral ETT  Additional Equipment:   Intra-op Plan:   Post-operative Plan: Post-operative intubation/ventilation  Informed Consent: I have reviewed the patients History and Physical, chart, labs and discussed the procedure including the risks, benefits and alternatives for the proposed anesthesia with the patient or authorized representative who has indicated his/her understanding and acceptance.     Dental advisory given  Plan Discussed with: Anesthesiologist, CRNA and Surgeon  Anesthesia Plan Comments:        Anesthesia Quick Evaluation

## 2020-07-23 NOTE — Progress Notes (Addendum)
Pt on EOB. Has been moving around room without difficulty. Pt practiced standing following sternal precautions but declined ambulating. Fairly steady walking to BR. Sts at home he might walk 100 ft but has to rest and bend over to relieve his hip pain. Encouraged pt to wear shoes or socks in hospital. Discussed sternal precautions, IS (>2500 mL), mobility post op and d/c planning. His wife does not work and will be with him. Gave materials to review. Pt will benefit from chair follow post op to allow rest. He is now motivated to quit smoking, some discussion and advise given. [id number] Joseph [name] CES, ACSM 2:41 PM 07/23/2020

## 2020-07-23 NOTE — Progress Notes (Signed)
The chaplain responded to a consult for AD. The chaplain led a discussion regarding AD. The patient is going to review the paperwork and notify the chaplain when it is complete, or if they have any questions. The chaplain is available for follow-up if needed.  Lavone Neri Chaplain Resident For questions concerning this note please contact me by pager [phone]

## 2020-07-23 NOTE — Progress Notes (Signed)
Pre cabg & lev mapping has been completed.   Preliminary results in CV Proc.   Blanch Media 07/23/2020 10:50 AM

## 2020-07-23 NOTE — Progress Notes (Signed)
      [address]       [place] [address]             [phone]        Patient is without chest pain.  Left leg cellulitis is stable.  He is currently undergoing Plavix washout for possible CABG next week with Dr. Cliffton Asters.   Lowella Dandy, PA-C

## 2020-07-23 NOTE — Progress Notes (Signed)
ANTICOAGULATION CONSULT NOTE - Follow Up Consult  Pharmacy Consult for Heparin Indication: h/o DVT/PE, ACS/STEMI  Allergies  Allergen Reactions  . Sulfa Antibiotics Other (See Comments)    headache    Patient Measurements: Height: 6\' 5"  (195.6 cm) Weight: (!) 169.3 kg (373 lb 3.8 oz) IBW/kg (Calculated) : 89.1 Heparin Dosing Weight:  125kg  Vital Signs: Temp: 97.8 F (36.6 C) (08/04 1933) Temp Source: Oral (08/04 1933) BP: 119/74 (08/04 1933) Pulse Rate: 70 (08/04 1933)  Labs: Recent Labs    07/21/20 0557 07/21/20 0557 07/21/20 1446 07/21/20 2055 07/22/20 0802 07/22/20 0952 07/23/20 0606 07/23/20 1135 07/23/20 1948  HGB 9.7*   < >  --   --  10.5*  --  10.5*  --   --   HCT 32.3*  --   --   --  31.0*  --  33.3*  --   --   PLT 279  --   --   --   --   --  300  --   --   HEPARINUNFRC 0.24*  --    < > 0.46  --   --   --  0.21* 0.24*  CREATININE 1.61*   < >  --   --  1.50* 1.38* 1.63*  --   --    < > = values in this interval not displayed.    Estimated Creatinine Clearance: 96 mL/min (A) (by C-G formula based on SCr of 1.63 mg/dL (H)).    Assessment: Anticoag: Heparin for ACS/STEMI and for Xarelto PTA for hx PE and DVT; VQ scan negative for PE + duplex neg DVT 7/30  - on Xarelto since 2014; pt reports last clot 6-7 yrs ago  - APTTs/HLs correlating 8/2 and aPTTs stopped on 8/2 INR 5.85 on Xarelto. Last dose PTA was 7/29 at 1130am.   - heparin resumed post-cath 8/3. Hep level 0.24 remains below goal.  Goal of Therapy:  Heparin level 0.3-0.7 units/ml Monitor platelets by anticoagulation protocol: Yes   Plan:  Heparin 3700 units (30 uts/kg) bolus and increase hep to 3200 units/hr Daily CBC, HL   Symantha Steeber S. 10/3, PharmD, BCPS Clinical Staff Pharmacist [email or web address] Merilynn Finland, Merilynn Finland 07/23/2020,8:59 PM

## 2020-07-24 ENCOUNTER — Encounter (HOSPITAL_COMMUNITY)
Admission: EM | Disposition: A | Discharge status: Home / Self Care | Payer: Self-pay | Source: Home / Self Care | Attending: Thoracic Surgery (Cardiothoracic Vascular Surgery)

## 2020-07-24 ENCOUNTER — Inpatient Hospital Stay (HOSPITAL_COMMUNITY): Payer: Medicare Other | Admitting: Certified Registered"

## 2020-07-24 ENCOUNTER — Inpatient Hospital Stay (HOSPITAL_COMMUNITY): Payer: Medicare Other

## 2020-07-24 ENCOUNTER — Encounter (HOSPITAL_COMMUNITY): Payer: Self-pay | Admitting: Family Medicine

## 2020-07-24 DIAGNOSIS — Z951 Presence of aortocoronary bypass graft: Secondary | ICD-10-CM

## 2020-07-24 HISTORY — PX: TEE WITHOUT CARDIOVERSION: SHX5443

## 2020-07-24 HISTORY — PX: CORONARY ARTERY BYPASS GRAFT: SHX141

## 2020-07-24 LAB — POCT I-STAT 7, (LYTES, BLD GAS, ICA,H+H)
Acid-Base Excess: 2 mmol/L (ref 0.0–2.0)
Acid-Base Excess: 4 mmol/L — ABNORMAL HIGH (ref 0.0–2.0)
Acid-Base Excess: 4 mmol/L — ABNORMAL HIGH (ref 0.0–2.0)
Acid-Base Excess: 1 mmol/L (ref 0.0–2.0)
Acid-Base Excess: 4 mmol/L — ABNORMAL HIGH (ref 0.0–2.0)
Acid-base deficit: 2 mmol/L (ref 0.0–2.0)
Bicarbonate: 29.2 mmol/L — ABNORMAL HIGH (ref 20.0–28.0)
Bicarbonate: 29.7 mmol/L — ABNORMAL HIGH (ref 20.0–28.0)
Bicarbonate: 28.8 mmol/L — ABNORMAL HIGH (ref 20.0–28.0)
Bicarbonate: 26.9 mmol/L (ref 20.0–28.0)
Bicarbonate: 28.2 mmol/L — ABNORMAL HIGH (ref 20.0–28.0)
Bicarbonate: 23.7 mmol/L (ref 20.0–28.0)
Calcium, Ion: 1.28 mmol/L (ref 1.15–1.40)
Calcium, Ion: 1.14 mmol/L — ABNORMAL LOW (ref 1.15–1.40)
Calcium, Ion: 1.17 mmol/L (ref 1.15–1.40)
Calcium, Ion: 1.15 mmol/L (ref 1.15–1.40)
Calcium, Ion: 1.48 mmol/L — ABNORMAL HIGH (ref 1.15–1.40)
Calcium, Ion: 1.36 mmol/L (ref 1.15–1.40)
HCT: 34 % — ABNORMAL LOW (ref 39.0–52.0)
HCT: 26 % — ABNORMAL LOW (ref 39.0–52.0)
HCT: 25 % — ABNORMAL LOW (ref 39.0–52.0)
HCT: 24 % — ABNORMAL LOW (ref 39.0–52.0)
HCT: 31 % — ABNORMAL LOW (ref 39.0–52.0)
HCT: 23 % — ABNORMAL LOW (ref 39.0–52.0)
Hemoglobin: 11.6 g/dL — ABNORMAL LOW (ref 13.0–17.0)
Hemoglobin: 8.8 g/dL — ABNORMAL LOW (ref 13.0–17.0)
Hemoglobin: 8.5 g/dL — ABNORMAL LOW (ref 13.0–17.0)
Hemoglobin: 10.5 g/dL — ABNORMAL LOW (ref 13.0–17.0)
Hemoglobin: 8.2 g/dL — ABNORMAL LOW (ref 13.0–17.0)
Hemoglobin: 7.8 g/dL — ABNORMAL LOW (ref 13.0–17.0)
O2 Saturation: 100 %
O2 Saturation: 100 %
O2 Saturation: 100 %
O2 Saturation: 100 %
O2 Saturation: 98 %
O2 Saturation: 100 %
Patient temperature: 98.4
Potassium: 5.1 mmol/L (ref 3.5–5.1)
Potassium: 4.5 mmol/L (ref 3.5–5.1)
Potassium: 4.6 mmol/L (ref 3.5–5.1)
Potassium: 4.8 mmol/L (ref 3.5–5.1)
Potassium: 5.1 mmol/L (ref 3.5–5.1)
Potassium: 5.5 mmol/L — ABNORMAL HIGH (ref 3.5–5.1)
Sodium: 142 mmol/L (ref 135–145)
Sodium: 142 mmol/L (ref 135–145)
Sodium: 140 mmol/L (ref 135–145)
Sodium: 140 mmol/L (ref 135–145)
Sodium: 141 mmol/L (ref 135–145)
Sodium: 140 mmol/L (ref 135–145)
TCO2: 31 mmol/L (ref 22–32)
TCO2: 31 mmol/L (ref 22–32)
TCO2: 30 mmol/L (ref 22–32)
TCO2: 28 mmol/L (ref 22–32)
TCO2: 29 mmol/L (ref 22–32)
TCO2: 25 mmol/L (ref 22–32)
pCO2 arterial: 57.8 mmHg — ABNORMAL HIGH (ref 32.0–48.0)
pCO2 arterial: 47.1 mmHg (ref 32.0–48.0)
pCO2 arterial: 43.8 mmHg (ref 32.0–48.0)
pCO2 arterial: 39.6 mmHg (ref 32.0–48.0)
pCO2 arterial: 50.9 mmHg — ABNORMAL HIGH (ref 32.0–48.0)
pCO2 arterial: 42.6 mmHg (ref 32.0–48.0)
pH, Arterial: 7.312 — ABNORMAL LOW (ref 7.350–7.450)
pH, Arterial: 7.408 (ref 7.350–7.450)
pH, Arterial: 7.426 (ref 7.350–7.450)
pH, Arterial: 7.332 — ABNORMAL LOW (ref 7.350–7.450)
pH, Arterial: 7.461 — ABNORMAL HIGH (ref 7.350–7.450)
pH, Arterial: 7.352 (ref 7.350–7.450)
pO2, Arterial: 433 mmHg — ABNORMAL HIGH (ref 83.0–108.0)
pO2, Arterial: 340 mmHg — ABNORMAL HIGH (ref 83.0–108.0)
pO2, Arterial: 391 mmHg — ABNORMAL HIGH (ref 83.0–108.0)
pO2, Arterial: 114 mmHg — ABNORMAL HIGH (ref 83.0–108.0)
pO2, Arterial: 357 mmHg — ABNORMAL HIGH (ref 83.0–108.0)
pO2, Arterial: 207 mmHg — ABNORMAL HIGH (ref 83.0–108.0)

## 2020-07-24 LAB — POCT I-STAT, CHEM 8
BUN: 29 mg/dL — ABNORMAL HIGH (ref 6–20)
BUN: 28 mg/dL — ABNORMAL HIGH (ref 6–20)
BUN: 27 mg/dL — ABNORMAL HIGH (ref 6–20)
BUN: 27 mg/dL — ABNORMAL HIGH (ref 6–20)
BUN: 26 mg/dL — ABNORMAL HIGH (ref 6–20)
BUN: 27 mg/dL — ABNORMAL HIGH (ref 6–20)
BUN: 26 mg/dL — ABNORMAL HIGH (ref 6–20)
Calcium, Ion: 1.31 mmol/L (ref 1.15–1.40)
Calcium, Ion: 1.26 mmol/L (ref 1.15–1.40)
Calcium, Ion: 1.27 mmol/L (ref 1.15–1.40)
Calcium, Ion: 1.27 mmol/L (ref 1.15–1.40)
Calcium, Ion: 1.16 mmol/L (ref 1.15–1.40)
Calcium, Ion: 1.18 mmol/L (ref 1.15–1.40)
Calcium, Ion: 1.5 mmol/L — ABNORMAL HIGH (ref 1.15–1.40)
Chloride: 103 mmol/L (ref 98–111)
Chloride: 106 mmol/L (ref 98–111)
Chloride: 105 mmol/L (ref 98–111)
Chloride: 105 mmol/L (ref 98–111)
Chloride: 103 mmol/L (ref 98–111)
Chloride: 104 mmol/L (ref 98–111)
Chloride: 106 mmol/L (ref 98–111)
Creatinine, Ser: 1.5 mg/dL — ABNORMAL HIGH (ref 0.61–1.24)
Creatinine, Ser: 1.5 mg/dL — ABNORMAL HIGH (ref 0.61–1.24)
Creatinine, Ser: 1.4 mg/dL — ABNORMAL HIGH (ref 0.61–1.24)
Creatinine, Ser: 1.5 mg/dL — ABNORMAL HIGH (ref 0.61–1.24)
Creatinine, Ser: 1.4 mg/dL — ABNORMAL HIGH (ref 0.61–1.24)
Creatinine, Ser: 1.4 mg/dL — ABNORMAL HIGH (ref 0.61–1.24)
Creatinine, Ser: 1.3 mg/dL — ABNORMAL HIGH (ref 0.61–1.24)
Glucose, Bld: 121 mg/dL — ABNORMAL HIGH (ref 70–99)
Glucose, Bld: 110 mg/dL — ABNORMAL HIGH (ref 70–99)
Glucose, Bld: 100 mg/dL — ABNORMAL HIGH (ref 70–99)
Glucose, Bld: 101 mg/dL — ABNORMAL HIGH (ref 70–99)
Glucose, Bld: 93 mg/dL (ref 70–99)
Glucose, Bld: 115 mg/dL — ABNORMAL HIGH (ref 70–99)
Glucose, Bld: 169 mg/dL — ABNORMAL HIGH (ref 70–99)
HCT: 32 % — ABNORMAL LOW (ref 39.0–52.0)
HCT: 31 % — ABNORMAL LOW (ref 39.0–52.0)
HCT: 29 % — ABNORMAL LOW (ref 39.0–52.0)
HCT: 29 % — ABNORMAL LOW (ref 39.0–52.0)
HCT: 28 % — ABNORMAL LOW (ref 39.0–52.0)
HCT: 26 % — ABNORMAL LOW (ref 39.0–52.0)
HCT: 21 % — ABNORMAL LOW (ref 39.0–52.0)
Hemoglobin: 10.9 g/dL — ABNORMAL LOW (ref 13.0–17.0)
Hemoglobin: 10.5 g/dL — ABNORMAL LOW (ref 13.0–17.0)
Hemoglobin: 9.9 g/dL — ABNORMAL LOW (ref 13.0–17.0)
Hemoglobin: 9.9 g/dL — ABNORMAL LOW (ref 13.0–17.0)
Hemoglobin: 9.5 g/dL — ABNORMAL LOW (ref 13.0–17.0)
Hemoglobin: 8.8 g/dL — ABNORMAL LOW (ref 13.0–17.0)
Hemoglobin: 7.1 g/dL — ABNORMAL LOW (ref 13.0–17.0)
Potassium: 5.1 mmol/L (ref 3.5–5.1)
Potassium: 4.4 mmol/L (ref 3.5–5.1)
Potassium: 4.4 mmol/L (ref 3.5–5.1)
Potassium: 4.4 mmol/L (ref 3.5–5.1)
Potassium: 4.3 mmol/L (ref 3.5–5.1)
Potassium: 4.7 mmol/L (ref 3.5–5.1)
Potassium: 4.7 mmol/L (ref 3.5–5.1)
Sodium: 142 mmol/L (ref 135–145)
Sodium: 142 mmol/L (ref 135–145)
Sodium: 142 mmol/L (ref 135–145)
Sodium: 142 mmol/L (ref 135–145)
Sodium: 143 mmol/L (ref 135–145)
Sodium: 140 mmol/L (ref 135–145)
Sodium: 141 mmol/L (ref 135–145)
TCO2: 29 mmol/L (ref 22–32)
TCO2: 24 mmol/L (ref 22–32)
TCO2: 25 mmol/L (ref 22–32)
TCO2: 25 mmol/L (ref 22–32)
TCO2: 26 mmol/L (ref 22–32)
TCO2: 27 mmol/L (ref 22–32)
TCO2: 23 mmol/L (ref 22–32)

## 2020-07-24 LAB — CBC
HCT: 35.6 % — ABNORMAL LOW (ref 39.0–52.0)
HCT: 22.6 % — ABNORMAL LOW (ref 39.0–52.0)
HCT: 23.4 % — ABNORMAL LOW (ref 39.0–52.0)
Hemoglobin: 11.3 g/dL — ABNORMAL LOW (ref 13.0–17.0)
Hemoglobin: 7.1 g/dL — ABNORMAL LOW (ref 13.0–17.0)
Hemoglobin: 7.4 g/dL — ABNORMAL LOW (ref 13.0–17.0)
MCH: 28.9 pg (ref 26.0–34.0)
MCH: 28.6 pg (ref 26.0–34.0)
MCH: 27.8 pg (ref 26.0–34.0)
MCHC: 31.7 g/dL (ref 30.0–36.0)
MCHC: 31.4 g/dL (ref 30.0–36.0)
MCHC: 31.6 g/dL (ref 30.0–36.0)
MCV: 91 fL (ref 80.0–100.0)
MCV: 91.1 fL (ref 80.0–100.0)
MCV: 88 fL (ref 80.0–100.0)
Platelets: 355 10*3/uL (ref 150–400)
Platelets: 322 10*3/uL (ref 150–400)
Platelets: 253 10*3/uL (ref 150–400)
RBC: 3.91 MIL/uL — ABNORMAL LOW (ref 4.22–5.81)
RBC: 2.48 MIL/uL — ABNORMAL LOW (ref 4.22–5.81)
RBC: 2.66 MIL/uL — ABNORMAL LOW (ref 4.22–5.81)
RDW: 13.5 % (ref 11.5–15.5)
RDW: 13.3 % (ref 11.5–15.5)
RDW: 14.6 % (ref 11.5–15.5)
WBC: 9.9 10*3/uL (ref 4.0–10.5)
WBC: 22 10*3/uL — ABNORMAL HIGH (ref 4.0–10.5)
WBC: 15.9 10*3/uL — ABNORMAL HIGH (ref 4.0–10.5)
nRBC: 0 % (ref 0.0–0.2)
nRBC: 0 % (ref 0.0–0.2)
nRBC: 0 % (ref 0.0–0.2)

## 2020-07-24 LAB — BLOOD GAS, ARTERIAL
Acid-Base Excess: 0.2 mmol/L (ref 0.0–2.0)
Bicarbonate: 24.9 mmol/L (ref 20.0–28.0)
Drawn by: 345601
FIO2: 21
O2 Saturation: 93.7 %
Patient temperature: 37
pCO2 arterial: 44.5 mmHg (ref 32.0–48.0)
pH, Arterial: 7.366 (ref 7.350–7.450)
pO2, Arterial: 70.4 mmHg — ABNORMAL LOW (ref 83.0–108.0)

## 2020-07-24 LAB — HEMOGLOBIN AND HEMATOCRIT, BLOOD
HCT: 24.3 % — ABNORMAL LOW (ref 39.0–52.0)
Hemoglobin: 7.8 g/dL — ABNORMAL LOW (ref 13.0–17.0)

## 2020-07-24 LAB — BASIC METABOLIC PANEL
Anion gap: 10 (ref 5–15)
Anion gap: 9 (ref 5–15)
BUN: 30 mg/dL — ABNORMAL HIGH (ref 6–20)
BUN: 26 mg/dL — ABNORMAL HIGH (ref 6–20)
CO2: 28 mmol/L (ref 22–32)
CO2: 21 mmol/L — ABNORMAL LOW (ref 22–32)
Calcium: 9.3 mg/dL (ref 8.9–10.3)
Calcium: 8.7 mg/dL — ABNORMAL LOW (ref 8.9–10.3)
Chloride: 102 mmol/L (ref 98–111)
Chloride: 109 mmol/L (ref 98–111)
Creatinine, Ser: 1.6 mg/dL — ABNORMAL HIGH (ref 0.61–1.24)
Creatinine, Ser: 1.46 mg/dL — ABNORMAL HIGH (ref 0.61–1.24)
GFR calc Af Amer: 59 mL/min — ABNORMAL LOW (ref >60)
GFR calc Af Amer: >60 mL/min (ref >60)
GFR calc non Af Amer: 51 mL/min — ABNORMAL LOW (ref >60)
GFR calc non Af Amer: 56 mL/min — ABNORMAL LOW (ref >60)
Glucose, Bld: 140 mg/dL — ABNORMAL HIGH (ref 70–99)
Glucose, Bld: 174 mg/dL — ABNORMAL HIGH (ref 70–99)
Potassium: 4.2 mmol/L (ref 3.5–5.1)
Potassium: 5.4 mmol/L — ABNORMAL HIGH (ref 3.5–5.1)
Sodium: 140 mmol/L (ref 135–145)
Sodium: 139 mmol/L (ref 135–145)

## 2020-07-24 LAB — GLUCOSE, CAPILLARY
Glucose-Capillary: 122 mg/dL — ABNORMAL HIGH (ref 70–99)
Glucose-Capillary: 146 mg/dL — ABNORMAL HIGH (ref 70–99)
Glucose-Capillary: 147 mg/dL — ABNORMAL HIGH (ref 70–99)
Glucose-Capillary: 149 mg/dL — ABNORMAL HIGH (ref 70–99)
Glucose-Capillary: 163 mg/dL — ABNORMAL HIGH (ref 70–99)
Glucose-Capillary: 166 mg/dL — ABNORMAL HIGH (ref 70–99)
Glucose-Capillary: 174 mg/dL — ABNORMAL HIGH (ref 70–99)
Glucose-Capillary: 181 mg/dL — ABNORMAL HIGH (ref 70–99)
Glucose-Capillary: 192 mg/dL — ABNORMAL HIGH (ref 70–99)
Glucose-Capillary: 202 mg/dL — ABNORMAL HIGH (ref 70–99)

## 2020-07-24 LAB — PLATELET COUNT: Platelets: 262 10*3/uL (ref 150–400)

## 2020-07-24 LAB — PROTIME-INR
INR: 1.5 — ABNORMAL HIGH (ref 0.8–1.2)
Prothrombin Time: 17.1 seconds — ABNORMAL HIGH (ref 11.4–15.2)

## 2020-07-24 LAB — SURGICAL PCR SCREEN
MRSA, PCR: POSITIVE — AB
Staphylococcus aureus: POSITIVE — AB

## 2020-07-24 LAB — ECHO INTRAOPERATIVE TEE
Height: 77 in
Weight: 5897.6 oz

## 2020-07-24 LAB — APTT: aPTT: 34 seconds (ref 24–36)

## 2020-07-24 LAB — MAGNESIUM
Magnesium: 1.6 mg/dL — ABNORMAL LOW (ref 1.7–2.4)
Magnesium: 2.3 mg/dL (ref 1.7–2.4)

## 2020-07-24 LAB — PREPARE RBC (CROSSMATCH)

## 2020-07-24 LAB — HEPARIN LEVEL (UNFRACTIONATED): Heparin Unfractionated: 0.55 IU/mL (ref 0.30–0.70)

## 2020-07-24 SURGERY — CORONARY ARTERY BYPASS GRAFTING (CABG)
Anesthesia: General | Site: Chest

## 2020-07-24 MED ORDER — ROCURONIUM BROMIDE 10 MG/ML (PF) SYRINGE
PREFILLED_SYRINGE | INTRAVENOUS | Status: DC | PRN
  Administered 2020-07-24: 100 mg via INTRAVENOUS
  Administered 2020-07-24 (×4): 50 mg via INTRAVENOUS

## 2020-07-24 MED ORDER — ANTITHROMBIN III (HUMAN) 500 UNITS IV SOLR
INTRAVENOUS | Status: DC | PRN
Start: 2020-07-24 — End: 2020-07-24
  Administered 2020-07-24: 500 [IU] via INTRAVENOUS

## 2020-07-24 MED ORDER — HEPARIN SODIUM (PORCINE) 1000 UNIT/ML IJ SOLN
INTRAMUSCULAR | Status: AC
  Filled 2020-07-24: qty 1

## 2020-07-24 MED ORDER — FENTANYL CITRATE (PF) 250 MCG/5ML IJ SOLN
INTRAMUSCULAR | Status: DC | PRN
  Administered 2020-07-24: 50 ug via INTRAVENOUS
  Administered 2020-07-24 (×2): 100 ug via INTRAVENOUS
  Administered 2020-07-24: 50 ug via INTRAVENOUS
  Administered 2020-07-24: 150 ug via INTRAVENOUS
  Administered 2020-07-24: 100 ug via INTRAVENOUS
  Administered 2020-07-24: 250 ug via INTRAVENOUS
  Administered 2020-07-24: 150 ug via INTRAVENOUS
  Administered 2020-07-24: 50 ug via INTRAVENOUS

## 2020-07-24 MED ORDER — SODIUM CHLORIDE 0.45 % IV SOLN: INTRAVENOUS | Status: DC | PRN

## 2020-07-24 MED ORDER — PHENYLEPHRINE HCL-NACL 20-0.9 MG/250ML-% IV SOLN: 0.0000 ug/min | INTRAVENOUS | Status: DC

## 2020-07-24 MED ORDER — CALCIUM CHLORIDE 10 % IV SOLN
INTRAVENOUS | Status: AC
  Filled 2020-07-24: qty 10

## 2020-07-24 MED ORDER — SODIUM CHLORIDE (PF) 0.9 % IJ SOLN
OROMUCOSAL | Status: DC | PRN
  Administered 2020-07-24: 4 mL via TOPICAL

## 2020-07-24 MED ORDER — BISACODYL 10 MG RE SUPP: 10.0000 mg | Freq: Every day | RECTAL | Status: DC

## 2020-07-24 MED ORDER — CHLORHEXIDINE GLUCONATE 0.12 % MT SOLN
15.0000 mL | OROMUCOSAL | Status: AC
  Administered 2020-07-24: 15 mL via OROMUCOSAL
  Filled 2020-07-24: qty 15

## 2020-07-24 MED ORDER — SODIUM CHLORIDE 0.9% FLUSH
10.0000 mL | Freq: Two times a day (BID) | INTRAVENOUS | Status: DC
  Administered 2020-07-24: 10 mL
  Administered 2020-07-25: 40 mL
  Administered 2020-07-25: 30 mL
  Administered 2020-07-26 – 2020-07-29 (×5): 10 mL

## 2020-07-24 MED ORDER — ROCURONIUM BROMIDE 10 MG/ML (PF) SYRINGE
PREFILLED_SYRINGE | INTRAVENOUS | Status: AC
  Filled 2020-07-24: qty 10

## 2020-07-24 MED ORDER — MIDAZOLAM HCL 5 MG/5ML IJ SOLN
INTRAMUSCULAR | Status: DC | PRN
  Administered 2020-07-24 (×2): 1 mg via INTRAVENOUS
  Administered 2020-07-24: 2 mg via INTRAVENOUS
  Administered 2020-07-24: 3 mg via INTRAVENOUS
  Administered 2020-07-24: 1 mg via INTRAVENOUS

## 2020-07-24 MED ORDER — PROPOFOL 10 MG/ML IV BOLUS
INTRAVENOUS | Status: AC
  Filled 2020-07-24: qty 20

## 2020-07-24 MED ORDER — ALBUMIN HUMAN 5 % IV SOLN
INTRAVENOUS | Status: DC | PRN
Start: 2020-07-24 — End: 2020-07-24

## 2020-07-24 MED ORDER — SODIUM CHLORIDE 0.9 % IV SOLN
INTRAVENOUS | Status: DC | PRN
Start: 2020-07-24 — End: 2020-07-24

## 2020-07-24 MED ORDER — PHENYLEPHRINE 40 MCG/ML (10ML) SYRINGE FOR IV PUSH (FOR BLOOD PRESSURE SUPPORT)
PREFILLED_SYRINGE | INTRAVENOUS | Status: DC | PRN
  Administered 2020-07-24 (×2): 40 ug via INTRAVENOUS
  Administered 2020-07-24: 80 ug via INTRAVENOUS
  Administered 2020-07-24 (×3): 40 ug via INTRAVENOUS

## 2020-07-24 MED ORDER — SODIUM CHLORIDE 0.9 % IV SOLN: INTRAVENOUS | Status: DC

## 2020-07-24 MED ORDER — SODIUM CHLORIDE 0.9% IV SOLUTION: Freq: Once | INTRAVENOUS | Status: DC

## 2020-07-24 MED ORDER — MIDAZOLAM HCL (PF) 10 MG/2ML IJ SOLN
INTRAMUSCULAR | Status: AC
  Filled 2020-07-24: qty 2

## 2020-07-24 MED ORDER — HEPARIN SODIUM (PORCINE) 1000 UNIT/ML IJ SOLN
INTRAMUSCULAR | Status: DC | PRN
  Administered 2020-07-24: 50000 [IU] via INTRAVENOUS
  Administered 2020-07-24: 10000 [IU] via INTRAVENOUS

## 2020-07-24 MED ORDER — ACETAMINOPHEN 500 MG PO TABS
1000.0000 mg | ORAL_TABLET | Freq: Four times a day (QID) | ORAL | Status: AC
  Administered 2020-07-25 – 2020-07-29 (×18): 1000 mg via ORAL
  Filled 2020-07-24 (×20): qty 2

## 2020-07-24 MED ORDER — PLASMA-LYTE 148 IV SOLN
INTRAVENOUS | Status: DC | PRN
  Administered 2020-07-24: 500 mL via INTRAVASCULAR

## 2020-07-24 MED ORDER — FAMOTIDINE IN NACL 20-0.9 MG/50ML-% IV SOLN
20.0000 mg | Freq: Two times a day (BID) | INTRAVENOUS | Status: AC
  Administered 2020-07-24 (×2): 20 mg via INTRAVENOUS
  Filled 2020-07-24 (×2): qty 50

## 2020-07-24 MED ORDER — ASPIRIN 81 MG PO CHEW: 324.0000 mg | CHEWABLE_TABLET | Freq: Every day | ORAL | Status: DC

## 2020-07-24 MED ORDER — SODIUM CHLORIDE 0.9 % IV SOLN: 250.0000 mL | INTRAVENOUS | Status: DC

## 2020-07-24 MED ORDER — LACTATED RINGERS IV SOLN: INTRAVENOUS | Status: DC | PRN

## 2020-07-24 MED ORDER — ONDANSETRON HCL 4 MG/2ML IJ SOLN
4.0000 mg | Freq: Four times a day (QID) | INTRAMUSCULAR | Status: DC | PRN
  Administered 2020-07-25: 4 mg via INTRAVENOUS
  Filled 2020-07-24: qty 2

## 2020-07-24 MED ORDER — SUCCINYLCHOLINE CHLORIDE 200 MG/10ML IV SOSY
PREFILLED_SYRINGE | INTRAVENOUS | Status: DC | PRN
  Administered 2020-07-24: 140 mg via INTRAVENOUS

## 2020-07-24 MED ORDER — LACTATED RINGERS IV SOLN: INTRAVENOUS | Status: DC

## 2020-07-24 MED ORDER — FENTANYL CITRATE (PF) 250 MCG/5ML IJ SOLN
INTRAMUSCULAR | Status: AC
  Filled 2020-07-24: qty 20

## 2020-07-24 MED ORDER — ACETAMINOPHEN 650 MG RE SUPP
650.0000 mg | Freq: Once | RECTAL | Status: AC
  Administered 2020-07-24: 650 mg via RECTAL

## 2020-07-24 MED ORDER — ACETAMINOPHEN 160 MG/5ML PO SOLN: 1000.0000 mg | Freq: Four times a day (QID) | ORAL | Status: AC

## 2020-07-24 MED ORDER — CALCIUM CHLORIDE 10 % IV SOLN
INTRAVENOUS | Status: DC | PRN
  Administered 2020-07-24: 100 mg via INTRAVENOUS
  Administered 2020-07-24: 200 mg via INTRAVENOUS
  Administered 2020-07-24 (×3): 100 mg via INTRAVENOUS
  Administered 2020-07-24: 300 mg via INTRAVENOUS
  Administered 2020-07-24: 100 mg via INTRAVENOUS

## 2020-07-24 MED ORDER — HEMOSTATIC AGENTS (NO CHARGE) OPTIME
TOPICAL | Status: DC | PRN
  Administered 2020-07-24: 1 via TOPICAL

## 2020-07-24 MED ORDER — 0.9 % SODIUM CHLORIDE (POUR BTL) OPTIME
TOPICAL | Status: DC | PRN
  Administered 2020-07-24: 6000 mL

## 2020-07-24 MED ORDER — CHLORHEXIDINE GLUCONATE CLOTH 2 % EX PADS
6.0000 | MEDICATED_PAD | Freq: Every day | CUTANEOUS | Status: DC
  Administered 2020-07-24 – 2020-07-30 (×7): 6 via TOPICAL

## 2020-07-24 MED ORDER — PANTOPRAZOLE SODIUM 40 MG PO TBEC
40.0000 mg | DELAYED_RELEASE_TABLET | Freq: Every day | ORAL | Status: DC
  Administered 2020-07-26 – 2020-07-31 (×6): 40 mg via ORAL
  Filled 2020-07-24 (×6): qty 1

## 2020-07-24 MED ORDER — NOREPINEPHRINE 4 MG/250ML-% IV SOLN
0.0000 ug/min | INTRAVENOUS | Status: DC
  Administered 2020-07-24: 10 ug/min via INTRAVENOUS
  Filled 2020-07-24: qty 250

## 2020-07-24 MED ORDER — SODIUM CHLORIDE 0.9% FLUSH: 10.0000 mL | INTRAVENOUS | Status: DC | PRN

## 2020-07-24 MED ORDER — PROTAMINE SULFATE 10 MG/ML IV SOLN
INTRAVENOUS | Status: AC
  Filled 2020-07-24: qty 25

## 2020-07-24 MED ORDER — PROPOFOL 10 MG/ML IV BOLUS
INTRAVENOUS | Status: DC | PRN
  Administered 2020-07-24: 140 mg via INTRAVENOUS

## 2020-07-24 MED ORDER — PROTAMINE SULFATE 10 MG/ML IV SOLN
INTRAVENOUS | Status: DC | PRN
  Administered 2020-07-24 (×6): 50 mg via INTRAVENOUS
  Administered 2020-07-24: 25 mg via INTRAVENOUS
  Administered 2020-07-24 (×3): 50 mg via INTRAVENOUS
  Administered 2020-07-24: 25 mg via INTRAVENOUS

## 2020-07-24 MED ORDER — ACETAMINOPHEN 160 MG/5ML PO SOLN: 650.0000 mg | Freq: Once | ORAL | Status: AC

## 2020-07-24 MED ORDER — POTASSIUM CHLORIDE 10 MEQ/50ML IV SOLN: 10.0000 meq | INTRAVENOUS | Status: AC

## 2020-07-24 MED ORDER — OXYCODONE HCL 5 MG PO TABS
5.0000 mg | ORAL_TABLET | ORAL | Status: DC | PRN
  Administered 2020-07-25: 10 mg via ORAL
  Administered 2020-07-25: 5 mg via ORAL
  Administered 2020-07-25 – 2020-07-26 (×5): 10 mg via ORAL
  Filled 2020-07-24 (×4): qty 2
  Filled 2020-07-24: qty 1
  Filled 2020-07-24 (×2): qty 2

## 2020-07-24 MED ORDER — METOPROLOL TARTRATE 12.5 MG HALF TABLET
12.5000 mg | ORAL_TABLET | Freq: Two times a day (BID) | ORAL | Status: DC
  Administered 2020-07-25 (×2): 12.5 mg via ORAL
  Filled 2020-07-24 (×3): qty 1

## 2020-07-24 MED ORDER — SODIUM CHLORIDE 0.9% FLUSH
3.0000 mL | Freq: Two times a day (BID) | INTRAVENOUS | Status: DC
  Administered 2020-07-25 – 2020-07-28 (×4): 3 mL via INTRAVENOUS

## 2020-07-24 MED ORDER — DEXTROSE 50 % IV SOLN: 0.0000 mL | INTRAVENOUS | Status: DC | PRN

## 2020-07-24 MED ORDER — LACTATED RINGERS IV SOLN: 500.0000 mL | Freq: Once | INTRAVENOUS | Status: DC | PRN

## 2020-07-24 MED ORDER — MUPIROCIN 2 % EX OINT
1.0000 "application " | TOPICAL_OINTMENT | Freq: Two times a day (BID) | CUTANEOUS | Status: AC
  Administered 2020-07-25 – 2020-07-28 (×10): 1 via NASAL
  Filled 2020-07-24: qty 22

## 2020-07-24 MED ORDER — BISACODYL 5 MG PO TBEC
10.0000 mg | DELAYED_RELEASE_TABLET | Freq: Every day | ORAL | Status: DC
  Administered 2020-07-25 – 2020-07-29 (×5): 10 mg via ORAL
  Filled 2020-07-24 (×5): qty 2

## 2020-07-24 MED ORDER — MORPHINE SULFATE (PF) 2 MG/ML IV SOLN
1.0000 mg | INTRAVENOUS | Status: DC | PRN
  Administered 2020-07-24 – 2020-07-25 (×2): 2 mg via INTRAVENOUS
  Administered 2020-07-25 – 2020-07-27 (×5): 4 mg via INTRAVENOUS
  Administered 2020-07-27: 2 mg via INTRAVENOUS
  Administered 2020-07-27: 4 mg via INTRAVENOUS
  Administered 2020-07-28: 1 mg via INTRAVENOUS
  Filled 2020-07-24 (×2): qty 2
  Filled 2020-07-24 (×3): qty 1
  Filled 2020-07-24: qty 2
  Filled 2020-07-24: qty 1
  Filled 2020-07-24 (×3): qty 2

## 2020-07-24 MED ORDER — SODIUM CHLORIDE 0.9% FLUSH: 3.0000 mL | INTRAVENOUS | Status: DC | PRN

## 2020-07-24 MED ORDER — SODIUM CHLORIDE 0.9 % IV SOLN
1.5000 g | Freq: Two times a day (BID) | INTRAVENOUS | Status: AC
  Administered 2020-07-24 – 2020-07-26 (×4): 1.5 g via INTRAVENOUS
  Filled 2020-07-24 (×4): qty 1.5

## 2020-07-24 MED ORDER — CHLORHEXIDINE GLUCONATE CLOTH 2 % EX PADS
6.0000 | MEDICATED_PAD | Freq: Every day | CUTANEOUS | Status: DC
  Administered 2020-07-24: 6 via TOPICAL

## 2020-07-24 MED ORDER — NITROGLYCERIN IN D5W 200-5 MCG/ML-% IV SOLN: 0.0000 ug/min | INTRAVENOUS | Status: DC

## 2020-07-24 MED ORDER — ASPIRIN EC 325 MG PO TBEC
325.0000 mg | DELAYED_RELEASE_TABLET | Freq: Every day | ORAL | Status: DC
  Administered 2020-07-25 – 2020-07-31 (×7): 325 mg via ORAL
  Filled 2020-07-24 (×7): qty 1

## 2020-07-24 MED ORDER — ALBUMIN HUMAN 5 % IV SOLN
250.0000 mL | INTRAVENOUS | Status: AC | PRN
  Administered 2020-07-24 (×2): 12.5 g via INTRAVENOUS

## 2020-07-24 MED ORDER — DEXMEDETOMIDINE HCL IN NACL 400 MCG/100ML IV SOLN
0.0000 ug/kg/h | INTRAVENOUS | Status: DC
  Administered 2020-07-24: 0.5 ug/kg/h via INTRAVENOUS
  Filled 2020-07-24: qty 100

## 2020-07-24 MED ORDER — MIDAZOLAM HCL 2 MG/2ML IJ SOLN: 2.0000 mg | INTRAMUSCULAR | Status: DC | PRN

## 2020-07-24 MED ORDER — DOCUSATE SODIUM 100 MG PO CAPS
200.0000 mg | ORAL_CAPSULE | Freq: Every day | ORAL | Status: DC
  Administered 2020-07-25 – 2020-07-31 (×6): 200 mg via ORAL
  Filled 2020-07-24 (×6): qty 2

## 2020-07-24 MED ORDER — METOPROLOL TARTRATE 5 MG/5ML IV SOLN: 2.5000 mg | INTRAVENOUS | Status: DC | PRN

## 2020-07-24 MED ORDER — ARTIFICIAL TEARS OPHTHALMIC OINT
TOPICAL_OINTMENT | OPHTHALMIC | Status: AC
  Filled 2020-07-24: qty 3.5

## 2020-07-24 MED ORDER — INSULIN REGULAR(HUMAN) IN NACL 100-0.9 UT/100ML-% IV SOLN: INTRAVENOUS | Status: DC

## 2020-07-24 MED ORDER — METOPROLOL TARTRATE 25 MG/10 ML ORAL SUSPENSION: 12.5000 mg | Freq: Two times a day (BID) | ORAL | Status: DC

## 2020-07-24 MED ORDER — MUPIROCIN 2 % EX OINT: 1.0000 "application " | TOPICAL_OINTMENT | Freq: Two times a day (BID) | CUTANEOUS | Status: DC

## 2020-07-24 MED ORDER — SUCCINYLCHOLINE CHLORIDE 200 MG/10ML IV SOSY
PREFILLED_SYRINGE | INTRAVENOUS | Status: AC
  Filled 2020-07-24: qty 10

## 2020-07-24 MED ORDER — MUPIROCIN 2 % EX OINT
1.0000 "application " | TOPICAL_OINTMENT | Freq: Two times a day (BID) | CUTANEOUS | Status: DC
  Administered 2020-07-24: 1 via NASAL
  Filled 2020-07-24: qty 22

## 2020-07-24 MED ORDER — MUPIROCIN 2 % EX OINT
1.0000 | TOPICAL_OINTMENT | Freq: Two times a day (BID) | CUTANEOUS | Status: DC
Start: 2020-07-24 — End: 2020-07-24

## 2020-07-24 MED ORDER — TRAMADOL HCL 50 MG PO TABS
50.0000 mg | ORAL_TABLET | ORAL | Status: DC | PRN
  Administered 2020-07-25 – 2020-07-27 (×4): 100 mg via ORAL
  Filled 2020-07-24 (×4): qty 2

## 2020-07-24 MED ORDER — VANCOMYCIN HCL IN DEXTROSE 1-5 GM/200ML-% IV SOLN
1000.0000 mg | Freq: Once | INTRAVENOUS | Status: AC
  Administered 2020-07-25: 1000 mg via INTRAVENOUS
  Filled 2020-07-24: qty 200

## 2020-07-24 MED ORDER — MAGNESIUM SULFATE 4 GM/100ML IV SOLN
4.0000 g | Freq: Once | INTRAVENOUS | Status: AC
  Administered 2020-07-24: 4 g via INTRAVENOUS
  Filled 2020-07-24: qty 100

## 2020-07-24 MED FILL — Potassium Chloride Inj 2 mEq/ML: INTRAVENOUS | Qty: 40 | Status: AC

## 2020-07-24 MED FILL — Magnesium Sulfate Inj 50%: INTRAMUSCULAR | Qty: 10 | Status: AC

## 2020-07-24 MED FILL — Heparin Sodium (Porcine) Inj 1000 Unit/ML: INTRAMUSCULAR | Qty: 30 | Status: AC

## 2020-07-24 SURGICAL SUPPLY — 119 items
ADH SKN CLS LQ APL DERMABOND (GAUZE/BANDAGES/DRESSINGS) ×3
APPLIER CLIP 9.375 SM OPEN (CLIP)
APR CLP SM 9.3 20 MLT OPN (CLIP)
BAG DECANTER FOR FLEXI CONT (MISCELLANEOUS) ×4 IMPLANT
BLADE CLIPPER SURG (BLADE) ×4 IMPLANT
BLADE STERNUM SYSTEM 6 (BLADE) ×4 IMPLANT
BLADE SURG 15 STRL LF DISP TIS (BLADE) ×3 IMPLANT
BLADE SURG 15 STRL SS (BLADE) ×4
BNDG ELASTIC 4X5.8 VLCR STR LF (GAUZE/BANDAGES/DRESSINGS) ×6 IMPLANT
BNDG ELASTIC 6X5.8 VLCR STR LF (GAUZE/BANDAGES/DRESSINGS) ×4 IMPLANT
BNDG GAUZE ELAST 4 BULKY (GAUZE/BANDAGES/DRESSINGS) ×4 IMPLANT
CABLE SURGICAL S-101-97-12 (CABLE) ×4 IMPLANT
CANISTER SUCT 3000ML PPV (MISCELLANEOUS) ×4 IMPLANT
CANNULA MC2 2 STG 29/37 NON-V (CANNULA) ×3 IMPLANT
CANNULA MC2 2 STG 36/46 NON-V (CANNULA) ×1 IMPLANT
CANNULA MC2 TWO STAGE (CANNULA) ×4
CANNULA NON VENT 20FR 12 (CANNULA) ×4 IMPLANT
CANNULA NON VENT 22FR 12 (CANNULA) ×2 IMPLANT
CANNULA VENOUS 2 STG 34/46 (CANNULA) ×4
CATH ROBINSON RED A/P 18FR (CATHETERS) ×8 IMPLANT
CLIP APPLIE 9.375 SM OPEN (CLIP) ×2 IMPLANT
CLIP RETRACTION 3.0MM CORONARY (MISCELLANEOUS) ×2 IMPLANT
CLIP VESOCCLUDE MED 24/CT (CLIP) ×2 IMPLANT
CLIP VESOCCLUDE SM WIDE 24/CT (CLIP) IMPLANT
CONN ST 1/2X1/2  BEN (MISCELLANEOUS) ×4
CONN ST 1/2X1/2 BEN (MISCELLANEOUS) ×3 IMPLANT
CONNECTOR BLAKE 2:1 CARIO BLK (MISCELLANEOUS) ×4 IMPLANT
COVER MAYO STAND STRL (DRAPES) ×4 IMPLANT
CUFF TOURN SGL QUICK 18X4 (TOURNIQUET CUFF) IMPLANT
CUFF TOURN SGL QUICK 24 (TOURNIQUET CUFF)
CUFF TRNQT CYL 24X4X16.5-23 (TOURNIQUET CUFF) IMPLANT
DERMABOND ADHESIVE PROPEN (GAUZE/BANDAGES/DRESSINGS) ×1
DERMABOND ADVANCED .7 DNX6 (GAUZE/BANDAGES/DRESSINGS) ×1 IMPLANT
DRAIN CHANNEL 15F RND FF W/TCR (WOUND CARE) ×2 IMPLANT
DRAIN CHANNEL 19F RND (DRAIN) ×12 IMPLANT
DRAIN CONNECTOR BLAKE 1:1 (MISCELLANEOUS) ×4 IMPLANT
DRAPE CARDIOVASCULAR INCISE (DRAPES) ×4
DRAPE EXTREMITY T 121X128X90 (DISPOSABLE) ×4 IMPLANT
DRAPE HALF SHEET 40X57 (DRAPES) ×4 IMPLANT
DRAPE INCISE IOBAN 66X45 STRL (DRAPES) IMPLANT
DRAPE SLUSH/WARMER DISC (DRAPES) ×4 IMPLANT
DRAPE SRG 135X102X78XABS (DRAPES) ×3 IMPLANT
DRESSING PEEL AND PLAC PRVNA20 (GAUZE/BANDAGES/DRESSINGS) ×1 IMPLANT
DRSG AQUACEL AG ADV 3.5X14 (GAUZE/BANDAGES/DRESSINGS) ×4 IMPLANT
DRSG COVADERM 4X14 (GAUZE/BANDAGES/DRESSINGS) ×2 IMPLANT
DRSG PEEL AND PLACE PREVENA 20 (GAUZE/BANDAGES/DRESSINGS) ×4
ELECT BLADE 4.0 EZ CLEAN MEGAD (MISCELLANEOUS) ×4
ELECT REM PT RETURN 9FT ADLT (ELECTROSURGICAL) ×8
ELECTRODE BLDE 4.0 EZ CLN MEGD (MISCELLANEOUS) ×1 IMPLANT
ELECTRODE REM PT RTRN 9FT ADLT (ELECTROSURGICAL) ×6 IMPLANT
EVACUATOR SILICONE 100CC (DRAIN) ×2 IMPLANT
FELT TEFLON 1X6 (MISCELLANEOUS) ×6 IMPLANT
GAUZE SPONGE 4X4 12PLY STRL (GAUZE/BANDAGES/DRESSINGS) ×8 IMPLANT
GAUZE SPONGE 4X4 12PLY STRL LF (GAUZE/BANDAGES/DRESSINGS) ×4 IMPLANT
GEL ULTRASOUND 20GR AQUASONIC (MISCELLANEOUS) ×2 IMPLANT
GLOVE BIO SURGEON STRL SZ 6 (GLOVE) ×2 IMPLANT
GLOVE BIO SURGEON STRL SZ7 (GLOVE) ×10 IMPLANT
GLOVE BIO SURGEON STRL SZ8 (GLOVE) ×2 IMPLANT
GLOVE BIOGEL M STRL SZ7.5 (GLOVE) ×8 IMPLANT
GOWN STRL REUS W/ TWL LRG LVL3 (GOWN DISPOSABLE) ×17 IMPLANT
GOWN STRL REUS W/ TWL XL LVL3 (GOWN DISPOSABLE) ×7 IMPLANT
GOWN STRL REUS W/TWL LRG LVL3 (GOWN DISPOSABLE) ×36
GOWN STRL REUS W/TWL XL LVL3 (GOWN DISPOSABLE) ×12
HEMOSTAT POWDER SURGIFOAM 1G (HEMOSTASIS) ×12 IMPLANT
HEMOSTAT SURGICEL 2X14 (HEMOSTASIS) ×2 IMPLANT
INSERT SUTURE HOLDER (MISCELLANEOUS) ×4 IMPLANT
KIT BASIN OR (CUSTOM PROCEDURE TRAY) ×4 IMPLANT
KIT SUCTION CATH 14FR (SUCTIONS) ×4 IMPLANT
KIT TURNOVER KIT B (KITS) ×6 IMPLANT
KIT VASOVIEW HEMOPRO 2 VH 4000 (KITS) ×4 IMPLANT
LEAD PACING MYOCARDI (MISCELLANEOUS) ×4 IMPLANT
MARKER GRAFT CORONARY BYPASS (MISCELLANEOUS) ×12 IMPLANT
NS IRRIG 1000ML POUR BTL (IV SOLUTION) ×20 IMPLANT
OFFPUMP STABILIZER SUV (MISCELLANEOUS) ×2 IMPLANT
PACK ACCESSORY CANNULA KIT (KITS) ×4 IMPLANT
PACK E OPEN HEART (SUTURE) ×4 IMPLANT
PACK OPEN HEART (CUSTOM PROCEDURE TRAY) ×4 IMPLANT
PAD ARMBOARD 7.5X6 YLW CONV (MISCELLANEOUS) ×16 IMPLANT
PAD ELECT DEFIB RADIOL ZOLL (MISCELLANEOUS) ×4 IMPLANT
PENCIL BUTTON HOLSTER BLD 10FT (ELECTRODE) ×4 IMPLANT
POSITIONER ACROBAT-I OFFPUMP (MISCELLANEOUS) ×2 IMPLANT
POSITIONER HEAD DONUT 9IN (MISCELLANEOUS) ×4 IMPLANT
PUNCH AORTIC ROTATE 4.0MM (MISCELLANEOUS) ×4 IMPLANT
SET CARDIOPLEGIA MPS 5001102 (MISCELLANEOUS) ×2 IMPLANT
SHEARS HARMONIC 9CM CVD (BLADE) ×6 IMPLANT
SPONGE LAP 18X18 RF (DISPOSABLE) ×10 IMPLANT
SUPPORT HEART JANKE-BARRON (MISCELLANEOUS) ×4 IMPLANT
SUT BONE WAX W31G (SUTURE) ×4 IMPLANT
SUT ETHIBOND X763 2 0 SH 1 (SUTURE) ×8 IMPLANT
SUT ETHILON 3 0 FSL (SUTURE) ×2 IMPLANT
SUT MNCRL AB 3-0 PS2 18 (SUTURE) ×8 IMPLANT
SUT MNCRL AB 4-0 PS2 18 (SUTURE) ×2 IMPLANT
SUT PDS AB 1 CTX 36 (SUTURE) ×8 IMPLANT
SUT PROLENE 4 0 RB 1 (SUTURE) ×4
SUT PROLENE 4 0 SH DA (SUTURE) ×4 IMPLANT
SUT PROLENE 4-0 RB1 .5 CRCL 36 (SUTURE) ×1 IMPLANT
SUT PROLENE 5 0 C 1 36 (SUTURE) ×14 IMPLANT
SUT PROLENE 7 0 BV 1 (SUTURE) ×6 IMPLANT
SUT PROLENE 7 0 BV1 MDA (SUTURE) ×6 IMPLANT
SUT SILK 2 0 (SUTURE) ×4
SUT SILK 2-0 18XBRD TIE 12 (SUTURE) ×1 IMPLANT
SUT SILK 4 0 TIE 10X30 (SUTURE) ×2 IMPLANT
SUT STEEL 6MS V (SUTURE) ×4 IMPLANT
SUT STEEL SZ 6 DBL 3X14 BALL (SUTURE) ×6 IMPLANT
SUT VIC AB 2-0 CT1 27 (SUTURE) ×12
SUT VIC AB 2-0 CT1 TAPERPNT 27 (SUTURE) ×3 IMPLANT
SUT VIC AB 2-0 SH 18 (SUTURE) ×2 IMPLANT
SUT VIC AB 3-0 SH 27 (SUTURE)
SUT VIC AB 3-0 SH 27X BRD (SUTURE) IMPLANT
SUT VIC AB 3-0 X1 27 (SUTURE) IMPLANT
SYR 50ML SLIP (SYRINGE) IMPLANT
SYSTEM SAHARA CHEST DRAIN ATS (WOUND CARE) ×4 IMPLANT
TAPE CLOTH SURG 4X10 WHT LF (GAUZE/BANDAGES/DRESSINGS) ×2 IMPLANT
TOWEL GREEN STERILE (TOWEL DISPOSABLE) ×6 IMPLANT
TOWEL GREEN STERILE FF (TOWEL DISPOSABLE) ×6 IMPLANT
TRAY FOLEY SLVR 16FR TEMP STAT (SET/KITS/TRAYS/PACK) ×4 IMPLANT
TUBING LAP HI FLOW INSUFFLATIO (TUBING) ×4 IMPLANT
UNDERPAD 30X36 HEAVY ABSORB (UNDERPADS AND DIAPERS) ×8 IMPLANT
WATER STERILE IRR 1000ML POUR (IV SOLUTION) ×8 IMPLANT

## 2020-07-24 NOTE — Op Note (Signed)
[address]       [place] [address]             [phone]                                          07/24/2020 Patient:  Indigo Chaddock Mcanany Pre-Op Dx: 3 vessel CAD   DM   HTN   Morbid obesity    Post-op Dx:  same Procedure: CABG X 4.  LIMA LAD, RSVG PDA, OM1, D1   Endoscopic greater saphenous vein harvest on the right Intra-operative Transesophageal Echocardiogram  Surgeon and Role:      * Isam Unrein, Eliezer Lofts, MD - Primary    * M. Roddenberry, PA-C - assisting   Anesthesia  general EBL:  500 ml Blood Administration: none Xclamp Time:  62 min Pump Time:   Drains: 19 F blake drain: L, mediastinal  Wires: none Counts: correct   Indications: This is a 49 year old male with a history of coronary artery disease and poorly controlled diabetes who presents with left main/three-vessel coronary disease.  On review of his images he has acceptable targets in his circumflex and LAD.  There is tandem lesions in in his right system with tight stenosis at the takeoff of his PDA.  He also has 2 large diagonal vessels with moderate stenosis in each.  We will review his echocardiogram his LV and RV function are preserved and there is no significant valvular disease.  The risks and benefits of surgical revascularization were discussed with him in detail and he has agreed to proceed with this procedure.  He will require vein mapping and also assessment of his left radial artery given his age.  Given his body habitus and hemoglobin A1c of 9.4 he is not a candidate for bilateral mammary harvest.  He also has had bilateral carpal tunnel release which may make radial harvest slightly challenging.  He does have a history of Plavix use for previous PCI back in 2020, but I will check a P2 Y 12 to determine whether he will require a full Plavix washout.  He tentatively will require a CABG 4 with endoscopic harvesting of his left radial artery.  Findings: Poor signal in the  left wrist.  Radial artery harvest was aborted.  Good LIMA, Good vein.  Heavily calcified RCA, and OM.  Good lumen.  The OM was intramyocardial.  Good flow on vein grafts  Operative Technique: All invasive lines were placed in pre-op holding.  After the risks, benefits and alternatives were thoroughly discussed, the patient was brought to the operative theatre.  Anesthesia was induced, and the patient was prepped and draped in normal sterile fashion.  An appropriate surgical pause was performed, and pre-operative antibiotics were dosed accordingly.  We began with simultaneous incisions were made along the right leg for harvesting of the greater saphenous vein and the chest for the sternotomy.  In regards to the sternotomy, this was carried down with bovie cautery, and the sternum was divided with a reciprocating saw.  Meticulous hemostasis was obtained.  The left internal thoracic artery was exposed and harvested in in pedicled fashion.  The patient was systemically heparinized, and the artery was divided distally, and placed in a papaverine sponge.    The sternal elevator was removed, and a retractor was placed.  The pericardium was divided in the  midline and fashioned into a cradle with pericardial stitches.   After we confirmed an appropriate ACT, the ascending aorta was cannulated in standard fashion.  The right atrial appendage was used for venous cannulation site.  Cardiopulmonary bypass was initiated, and the heart retractor was placed. The cross clamp was applied, and a dose of anterograde cardioplegia was given with good arrest of the heart.  We moved to the posterior wall of the heart, and found a good target on the PDA.  An arteriotomy was made, and the vein graft was anastomosed to it in an end to side fashion.  Next we exposed the lateral wall, and found a good target on the OM1.  An end to side anastomosis with the vein graft was then created.  Next, we exposed the anterior wall of the heart and  identified a good target on D1.   An arteriotomy was created.  The vein was anastomosed in an end to side fashion.  Finally, we exposed a good target on the LAD, and fashioned an end to side anastomosis between it and the LITA.  We began to re-warm, and a re-animation dose of cardioplegia was given.  The heart was de-aired, and the cross clamp was removed.  Meticulous hemostasis was obtained.    A partial occludding clamp was then placed on the ascending aorta, and we created an end to side anastomosis between it and the proximal vein grafts.  The proximal sites were marked with rings.  Hemostasis was obtained, and we separated from cardiopulmonary bypass without event.the heparin was reversed with protamine.  Chest tubes and wires were placed, and the sternum was re-approximated with with sternal wires.  The soft tissue and skin were re-approximated wth absorbable suture.    The patient tolerated the procedure without any immediate complications, and was transferred to the ICU in guarded condition.  Justo Hengel Keane Scrape

## 2020-07-24 NOTE — Progress Notes (Signed)
Patient surgical PCR resulted positive for both MRSA and Staphylococcus aureus. On call cardiothoracic APP notified of positive result.

## 2020-07-24 NOTE — Progress Notes (Signed)
  Echocardiogram Echocardiogram Transesophageal has been performed.  Leta Jungling M 07/24/2020, 9:02 AM

## 2020-07-24 NOTE — Anesthesia Postprocedure Evaluation (Signed)
Anesthesia Post Note  Patient: Joseph [name]  Procedure(s) Performed: CORONARY ARTERY BYPASS GRAFTING (CABG), ON PUMP, TIMES FOUR, USING LEFT INTERNAL MAMMARY ARTERY AND ENDOSCOPICALLY HARVESTED RIGHT GREATER SAPHENOUS VEIN (N/A Chest) TRANSESOPHAGEAL ECHOCARDIOGRAM (TEE) (N/A )     Patient location during evaluation: SICU Anesthesia Type: General Level of consciousness: awake Pain management: pain level controlled Vital Signs Assessment: post-procedure vital signs reviewed and stable Respiratory status: spontaneous breathing and patient connected to T-piece oxygen Cardiovascular status: stable Postop Assessment: no apparent nausea or vomiting Anesthetic complications: no   No complications documented.  Last Vitals:  Vitals:   07/24/20 0803 07/24/20 1548  BP:    Pulse: 65   Resp: 12   Temp:    SpO2: 100% 100%    Last Pain:  Vitals:   07/24/20 0532  TempSrc: Oral  PainSc:                  Gabrella Stroh

## 2020-07-24 NOTE — Procedures (Signed)
Extubation Procedure Note  Patient Details:   Name: Cote Mayabb Mascari DOB: 1971/06/17 MRN: [id number]   Airway Documentation:    Vent end date: 07/24/20 Vent end time: 2254   Evaluation  O2 sats: stable throughout Complications: No apparent complications Patient did tolerate procedure well. Bilateral Breath Sounds: Clear, Diminished   Yes    Weaned pt per 2H rapid wean protocol. Suctioned mouth and airway. Leak noted NIF -24 VC1L. No stridor noted. Extubated to 4L [place].   Ulice Brilliant 07/24/2020, 11:03 PM

## 2020-07-24 NOTE — Anesthesia Procedure Notes (Addendum)
Arterial Line Insertion Start/End8/04/2020 8:00 AM, 07/24/2020 8:05 AM Performed by: Kipp Brood, MD, anesthesiologist  Preanesthetic checklist: patient identified, IV checked, site marked, risks and benefits discussed, surgical consent, monitors and equipment checked, pre-op evaluation and timeout performed Right, brachial was placed Catheter size: 20 G Hand hygiene performed , maximum sterile barriers used  and Seldinger technique used Allen's test indicative of satisfactory collateral circulation Attempts: 1 Procedure performed using ultrasound guided technique. Ultrasound Notes:anatomy identified, needle tip was noted to be adjacent to the nerve/plexus identified, no ultrasound evidence of intravascular and/or intraneural injection and image(s) printed for medical record Following insertion, line sutured, dressing applied and Biopatch. Patient tolerated the procedure well with no immediate complications.

## 2020-07-24 NOTE — Brief Op Note (Signed)
07/17/2020 - 07/24/2020  2:48 PM  PATIENT:  Joseph [name]  49 y.o. male  PRE-OPERATIVE DIAGNOSIS:  Coronary Artery Disease  POST-OPERATIVE DIAGNOSIS:  Coronary Artery Disease  PROCEDURE:  CORONARY ARTERY BYPASS GRAFTING (CABG), ON PUMP, TIMES FOUR, USING LEFT INTERNAL MAMMARY ARTERY AND ENDOSCOPICALLY HARVESTED RIGHT GREATER SAPHENOUS VEIN (N/A) - FLOW TAC TRANSESOPHAGEAL ECHOCARDIOGRAM (TEE)   LIMA->LAD SVG-.D1 SVG->OM SVG->PDA  Vein Harvest Time: 95 min Prep Time:15 min  SURGEON:  Lightfoot, Eliezer Lofts, MD - Primary  PHYSICIAN ASSISTANT: Martell Mcfadyen  ANESTHESIA:   general  EBL:  Per perfusion and anesthesia records   BLOOD ADMINISTERED:  Fresh frozen plasma  DRAINS: Mediastinal, left pleural, and right EVH tunnel drains   LOCAL MEDICATIONS USED:  NONE  SPECIMEN:  No Specimen  DISPOSITION OF SPECIMEN:  N/A  COUNTS:  YES  DICTATION: .Dragon Dictation  PLAN OF CARE: Admit to inpatient   PATIENT DISPOSITION:  ICU - intubated and hemodynamically stable.   Delay start of Pharmacological VTE agent (>24hrs) due to surgical blood loss or risk of bleeding: yes

## 2020-07-24 NOTE — Anesthesia Procedure Notes (Signed)
Procedure Name: Intubation Date/Time: 07/24/2020 8:39 AM Performed by: Barrington Ellison, CRNA Pre-anesthesia Checklist: Patient identified, Emergency Drugs available, Suction available and Patient being monitored Patient Re-evaluated:Patient Re-evaluated prior to induction Oxygen Delivery Method: Circle System Utilized Preoxygenation: Pre-oxygenation with 100% oxygen Induction Type: IV induction and Rapid sequence Ventilation: Mask ventilation without difficulty Laryngoscope Size: Mac and 4 Grade View: Grade I Tube type: Oral Tube size: 8.0 mm Number of attempts: 1 Airway Equipment and Method: Stylet and Oral airway Placement Confirmation: ETT inserted through vocal cords under direct vision,  positive ETCO2 and breath sounds checked- equal and bilateral Secured at: 24 cm Tube secured with: Tape Dental Injury: Teeth and Oropharynx as per pre-operative assessment

## 2020-07-24 NOTE — Progress Notes (Signed)
Pt is now s/p CABG. Spoke with CTS. They will be taking over as primary. TRH will sign off. Please call with question.

## 2020-07-24 NOTE — Progress Notes (Signed)
     [address]       [place] [address]             [phone]       No events Vitals:   07/23/20 1933 07/24/20 0532  BP: 119/74 (!) 117/91  Pulse: 70 67  Resp: 16 17  Temp: 97.8 F (36.6 C) 97.9 F (36.6 C)  SpO2: 98% 99%   Alert NAD Sinus EWOB  Creat up, but around pre-cath baseline  OR today for CABG with left radial artery harvest  Uzair Godley O Webber Michiels

## 2020-07-24 NOTE — Progress Notes (Addendum)
Report given to OR and anesthesia. OR informed that ABG is still in progress. Anesthesia requested more recent CBG; will update when this CBG is obtained. Patient remains on unit at this time.  CBG taken and 2 units insulin given. Heparin stopped per anesthesia. Patient has been transferred from unit. Belongings are now accounted for and given to wife.

## 2020-07-24 NOTE — Progress Notes (Addendum)
TCTS BRIEF SICU PROGRESS NOTE  Day of Surgery  S/P Procedure(s) (LRB): CORONARY ARTERY BYPASS GRAFTING (CABG), ON PUMP, TIMES FOUR, USING LEFT INTERNAL MAMMARY ARTERY AND ENDOSCOPICALLY HARVESTED RIGHT GREATER SAPHENOUS VEIN (N/A) TRANSESOPHAGEAL ECHOCARDIOGRAM (TEE) (N/A)   Sedated on vent NSR w/ stable hemodynamics, no drips O2 sats 100% on 50% FiO2 Chest tube output trending down UOP adequate Labs okay although Hgb 7.1 Platelet count 322k and coags okay w/ INR 1.5  Plan: Continue routine early postop.  Watch chest tube output and Hgb - consider transfusion as needed  Purcell Nails, MD 07/24/2020 6:02 PM

## 2020-07-24 NOTE — Transfer of Care (Signed)
Immediate Anesthesia Transfer of Care Note  Patient: Joseph [name]  Procedure(s) Performed: CORONARY ARTERY BYPASS GRAFTING (CABG), ON PUMP, TIMES FOUR, USING LEFT INTERNAL MAMMARY ARTERY AND ENDOSCOPICALLY HARVESTED RIGHT GREATER SAPHENOUS VEIN (N/A Chest) TRANSESOPHAGEAL ECHOCARDIOGRAM (TEE) (N/A )  Patient Location: ICU  Anesthesia Type:General  Level of Consciousness: Patient remains intubated per anesthesia plan  Airway & Oxygen Therapy: Patient remains intubated per anesthesia plan and Patient placed on Ventilator (see vital sign flow sheet for setting)  Post-op Assessment: Report given to RN  Post vital signs: Reviewed and stable  Last Vitals:  Vitals Value Taken Time  BP 110/70 07/24/20 1554  Temp    Pulse    Resp 12 07/24/20 1556  SpO2    Vitals shown include unvalidated device data.  Last Pain:  Vitals:   07/24/20 0532  TempSrc: Oral  PainSc:       Patients Stated Pain Goal: 0 (07/23/20 1101)  Complications: No complications documented.

## 2020-07-24 NOTE — Anesthesia Procedure Notes (Signed)
Central Venous Catheter Insertion Performed by: Belinda Block, MD Start/End8/04/2020 7:50 AM, 07/24/2020 8:05 PM Preanesthetic checklist: patient identified, IV checked, site marked, risks and benefits discussed, surgical consent, monitors and equipment checked, pre-op evaluation and timeout performed Position: Trendelenburg Patient sedated Hand hygiene performed , maximum sterile barriers used  and Seldinger technique used Sheath introducer Procedure performed using ultrasound guided technique. Attempts: 1 Following insertion, line sutured, dressing applied and Biopatch. Post procedure assessment: blood return through all ports  Patient tolerated the procedure well with no immediate complications. Additional procedure comments: MAC Introducer with triple Lumen Ports.

## 2020-07-24 NOTE — OR Nursing (Signed)
Forty-five minute call to 2 Heart Charge nurse at 1429. Spoke to [place].

## 2020-07-25 ENCOUNTER — Encounter (HOSPITAL_COMMUNITY): Payer: Self-pay | Admitting: Thoracic Surgery (Cardiothoracic Vascular Surgery)

## 2020-07-25 ENCOUNTER — Inpatient Hospital Stay (HOSPITAL_COMMUNITY): Payer: Medicare Other

## 2020-07-25 LAB — PREPARE FRESH FROZEN PLASMA: Unit division: 0

## 2020-07-25 LAB — POCT I-STAT 7, (LYTES, BLD GAS, ICA,H+H)
Acid-base deficit: 5 mmol/L — ABNORMAL HIGH (ref 0.0–2.0)
Acid-base deficit: 5 mmol/L — ABNORMAL HIGH (ref 0.0–2.0)
Bicarbonate: 20 mmol/L (ref 20.0–28.0)
Bicarbonate: 21 mmol/L (ref 20.0–28.0)
Calcium, Ion: 1.26 mmol/L (ref 1.15–1.40)
Calcium, Ion: 1.3 mmol/L (ref 1.15–1.40)
HCT: 21 % — ABNORMAL LOW (ref 39.0–52.0)
HCT: 21 % — ABNORMAL LOW (ref 39.0–52.0)
Hemoglobin: 7.1 g/dL — ABNORMAL LOW (ref 13.0–17.0)
Hemoglobin: 7.1 g/dL — ABNORMAL LOW (ref 13.0–17.0)
O2 Saturation: 97 %
O2 Saturation: 98 %
Patient temperature: 37.2
Patient temperature: 37.4
Potassium: 5 mmol/L (ref 3.5–5.1)
Potassium: 5 mmol/L (ref 3.5–5.1)
Sodium: 142 mmol/L (ref 135–145)
Sodium: 143 mmol/L (ref 135–145)
TCO2: 21 mmol/L — ABNORMAL LOW (ref 22–32)
TCO2: 22 mmol/L (ref 22–32)
pCO2 arterial: 38.4 mmHg (ref 32.0–48.0)
pCO2 arterial: 41.6 mmHg (ref 32.0–48.0)
pH, Arterial: 7.312 — ABNORMAL LOW (ref 7.350–7.450)
pH, Arterial: 7.327 — ABNORMAL LOW (ref 7.350–7.450)
pO2, Arterial: 124 mmHg — ABNORMAL HIGH (ref 83.0–108.0)
pO2, Arterial: 97 mmHg (ref 83.0–108.0)

## 2020-07-25 LAB — GLUCOSE, CAPILLARY
Glucose-Capillary: 128 mg/dL — ABNORMAL HIGH (ref 70–99)
Glucose-Capillary: 128 mg/dL — ABNORMAL HIGH (ref 70–99)
Glucose-Capillary: 136 mg/dL — ABNORMAL HIGH (ref 70–99)
Glucose-Capillary: 138 mg/dL — ABNORMAL HIGH (ref 70–99)
Glucose-Capillary: 141 mg/dL — ABNORMAL HIGH (ref 70–99)
Glucose-Capillary: 141 mg/dL — ABNORMAL HIGH (ref 70–99)
Glucose-Capillary: 155 mg/dL — ABNORMAL HIGH (ref 70–99)
Glucose-Capillary: 156 mg/dL — ABNORMAL HIGH (ref 70–99)
Glucose-Capillary: 162 mg/dL — ABNORMAL HIGH (ref 70–99)
Glucose-Capillary: 178 mg/dL — ABNORMAL HIGH (ref 70–99)
Glucose-Capillary: 183 mg/dL — ABNORMAL HIGH (ref 70–99)
Glucose-Capillary: 187 mg/dL — ABNORMAL HIGH (ref 70–99)
Glucose-Capillary: 190 mg/dL — ABNORMAL HIGH (ref 70–99)
Glucose-Capillary: 93 mg/dL (ref 70–99)

## 2020-07-25 LAB — BASIC METABOLIC PANEL
Anion gap: 9 (ref 5–15)
Anion gap: 8 (ref 5–15)
BUN: 24 mg/dL — ABNORMAL HIGH (ref 6–20)
BUN: 28 mg/dL — ABNORMAL HIGH (ref 6–20)
CO2: 21 mmol/L — ABNORMAL LOW (ref 22–32)
CO2: 23 mmol/L (ref 22–32)
Calcium: 8.6 mg/dL — ABNORMAL LOW (ref 8.9–10.3)
Calcium: 8.5 mg/dL — ABNORMAL LOW (ref 8.9–10.3)
Chloride: 108 mmol/L (ref 98–111)
Chloride: 106 mmol/L (ref 98–111)
Creatinine, Ser: 1.45 mg/dL — ABNORMAL HIGH (ref 0.61–1.24)
Creatinine, Ser: 1.91 mg/dL — ABNORMAL HIGH (ref 0.61–1.24)
GFR calc Af Amer: >60 mL/min (ref >60)
GFR calc Af Amer: 47 mL/min — ABNORMAL LOW (ref >60)
GFR calc non Af Amer: 57 mL/min — ABNORMAL LOW (ref >60)
GFR calc non Af Amer: 41 mL/min — ABNORMAL LOW (ref >60)
Glucose, Bld: 168 mg/dL — ABNORMAL HIGH (ref 70–99)
Glucose, Bld: 220 mg/dL — ABNORMAL HIGH (ref 70–99)
Potassium: 4.9 mmol/L (ref 3.5–5.1)
Potassium: 5.3 mmol/L — ABNORMAL HIGH (ref 3.5–5.1)
Sodium: 138 mmol/L (ref 135–145)
Sodium: 137 mmol/L (ref 135–145)

## 2020-07-25 LAB — CBC
HCT: 19.3 % — ABNORMAL LOW (ref 39.0–52.0)
HCT: 23.6 % — ABNORMAL LOW (ref 39.0–52.0)
Hemoglobin: 6.2 g/dL — CL (ref 13.0–17.0)
Hemoglobin: 7.4 g/dL — ABNORMAL LOW (ref 13.0–17.0)
MCH: 28.6 pg (ref 26.0–34.0)
MCH: 28.6 pg (ref 26.0–34.0)
MCHC: 32.1 g/dL (ref 30.0–36.0)
MCHC: 31.4 g/dL (ref 30.0–36.0)
MCV: 88.9 fL (ref 80.0–100.0)
MCV: 91.1 fL (ref 80.0–100.0)
Platelets: 209 10*3/uL (ref 150–400)
Platelets: 200 10*3/uL (ref 150–400)
RBC: 2.17 MIL/uL — ABNORMAL LOW (ref 4.22–5.81)
RBC: 2.59 MIL/uL — ABNORMAL LOW (ref 4.22–5.81)
RDW: 15 % (ref 11.5–15.5)
RDW: 15.6 % — ABNORMAL HIGH (ref 11.5–15.5)
WBC: 11.9 10*3/uL — ABNORMAL HIGH (ref 4.0–10.5)
WBC: 14.1 10*3/uL — ABNORMAL HIGH (ref 4.0–10.5)
nRBC: 0 % (ref 0.0–0.2)
nRBC: 0.1 % (ref 0.0–0.2)

## 2020-07-25 LAB — BPAM FFP
Blood Product Expiration Date: 202108092359
ISSUE DATE / TIME: 202108051152
Unit Type and Rh: 6200

## 2020-07-25 LAB — PREPARE RBC (CROSSMATCH)

## 2020-07-25 LAB — MAGNESIUM
Magnesium: 2.1 mg/dL (ref 1.7–2.4)
Magnesium: 2 mg/dL (ref 1.7–2.4)

## 2020-07-25 MED ORDER — LIDOCAINE 5 % EX PTCH
2.0000 | MEDICATED_PATCH | CUTANEOUS | Status: DC
  Administered 2020-07-25 – 2020-07-31 (×7): 2 via TRANSDERMAL
  Filled 2020-07-25 (×8): qty 2

## 2020-07-25 MED ORDER — INSULIN DETEMIR 100 UNIT/ML ~~LOC~~ SOLN
10.0000 [IU] | Freq: Once | SUBCUTANEOUS | Status: AC
  Administered 2020-07-25: 10 [IU] via SUBCUTANEOUS
  Filled 2020-07-25: qty 0.1

## 2020-07-25 MED ORDER — MUPIROCIN CALCIUM 2 % EX CREA
TOPICAL_CREAM | Freq: Two times a day (BID) | CUTANEOUS | Status: DC
  Administered 2020-07-30: 1 via TOPICAL
  Filled 2020-07-25: qty 15

## 2020-07-25 MED ORDER — SODIUM CHLORIDE 0.9% IV SOLUTION: Freq: Once | INTRAVENOUS | Status: AC

## 2020-07-25 MED ORDER — INSULIN ASPART 100 UNIT/ML ~~LOC~~ SOLN
0.0000 [IU] | SUBCUTANEOUS | Status: DC
  Administered 2020-07-25 – 2020-07-26 (×3): 4 [IU] via SUBCUTANEOUS
  Administered 2020-07-26 (×2): 8 [IU] via SUBCUTANEOUS

## 2020-07-25 MED ORDER — ENOXAPARIN SODIUM 40 MG/0.4ML ~~LOC~~ SOLN
40.0000 mg | Freq: Every day | SUBCUTANEOUS | Status: DC
  Administered 2020-07-25 – 2020-07-30 (×6): 40 mg via SUBCUTANEOUS
  Filled 2020-07-25 (×6): qty 0.4

## 2020-07-25 MED ORDER — INSULIN DETEMIR 100 UNIT/ML ~~LOC~~ SOLN
10.0000 [IU] | Freq: Every day | SUBCUTANEOUS | Status: DC
  Filled 2020-07-25: qty 0.1

## 2020-07-25 NOTE — Progress Notes (Signed)
[address]       [place] [address]             [phone]                 1 Day Post-Op Procedure(s) (LRB): CORONARY ARTERY BYPASS GRAFTING (CABG), ON PUMP, TIMES FOUR, USING LEFT INTERNAL MAMMARY ARTERY AND ENDOSCOPICALLY HARVESTED RIGHT GREATER SAPHENOUS VEIN (N/A) TRANSESOPHAGEAL ECHOCARDIOGRAM (TEE) (N/A)   Events: No events overnight.  Extubated.  Does complain of some incisional chest pain. _______________________________________________________________ Vitals: BP 111/70   Pulse 98   Temp 99.3 F (37.4 C)   Resp (!) 21   Ht 6\' 5"  (1.956 m)   Wt (!) 172.2 kg   SpO2 99%   BMI 45.02 kg/m   - Neuro: Alert NAD  - Cardiovascular: Sinus  Drips: None.  EKG evaluated.  Essentially unchanged from postoperative EKG. CVP:  [9 mmHg] 9 mmHg  - Pulm: Easy work of breathing  ABG    Component Value Date/Time   PHART 7.327 (L) 07/24/2020 2358   PCO2ART 38.4 07/24/2020 2358   PO2ART 97 07/24/2020 2358   HCO3 20.0 07/24/2020 2358   TCO2 21 (L) 07/24/2020 2358   ACIDBASEDEF 5.0 (H) 07/24/2020 2358   O2SAT 97.0 07/24/2020 2358    - Abd: Soft - Extremity: Edematous.  Cellulitis to the left foot.  Left fourth toe appears infected.  .Intake/Output      08/05 0701 - 08/06 0700 08/06 0701 - 08/07 0700   P.O.     I.V. (mL/kg) 3857.1 (22.4)    Blood 1005    IV Piggyback 1841.6    Total Intake(mL/kg) 6703.8 (38.9)    Urine (mL/kg/hr) 3060 (0.7) 125 (0.1)   Emesis/NG output 0    Drains 45    Blood 750    Chest Tube 720    Total Output 4575 125   Net +2128.8 -125           _______________________________________________________________ Labs: CBC Latest Ref Rng & Units 07/25/2020 07/24/2020 07/24/2020  WBC 4.0 - 10.5 K/uL 11.9(H) - -  Hemoglobin 13.0 - 17.0 g/dL 6.2(LL) 7.1(L) 7.1(L)  Hematocrit 39 - 52 % 19.3(L) 21.0(L) 21.0(L)  Platelets 150 - 400 K/uL 209 - -   CMP Latest Ref Rng & Units 07/25/2020 07/24/2020 07/24/2020  Glucose 70 - 99 mg/dL  09/23/2020) - -  BUN 6 - 20 mg/dL 643(P) - -  Creatinine 29(J - 1.24 mg/dL 1.88) - -  Sodium 4.16(S - 145 mmol/L 138 143 142  Potassium 3.5 - 5.1 mmol/L 4.9 5.0 5.0  Chloride 98 - 111 mmol/L 108 - -  CO2 22 - 32 mmol/L 21(L) - -  Calcium 8.9 - 10.3 mg/dL 063) - -  Total Protein 6.5 - 8.1 g/dL - - -  Total Bilirubin 0.3 - 1.2 mg/dL - - -  Alkaline Phos 38 - 126 U/L - - -  AST 15 - 41 U/L - - -  ALT 0 - 44 U/L - - -    CXR: Stable  _______________________________________________________________  Assessment and Plan: POD 1 s/p CABG  Neuro: Adding lidocaine patches to improve pain control CV: Off all drips.  We will remove A-line.  On aspirin statin beta-blocker. Pulm: Continue pulmonary toilet.  We will keep chest tubes for now. Renal: Creatinine stable.  Good urine output GI: Advancing diet Heme: Postoperative anemia expected.  We will transfuse 1 unit of blood ID: We will consult orthopedic surgery for  evaluation of his left fourth toe.  Will provide wound care for now. Endo: Sliding scale insulin Dispo: Likely transfer to floor tomorrow.  Brynda Greathouse, MD 07/25/2020 2:22 PM

## 2020-07-25 NOTE — Progress Notes (Signed)
EKG this am showing STEMI. Dr. Cliffton Asters paged and notified. Patient vitals stable. RN on day shift notified also.

## 2020-07-25 NOTE — Progress Notes (Signed)
Inpatient Diabetes Program Recommendations  AACE/ADA: New Consensus Statement on Inpatient Glycemic Control (2015)  Target Ranges:  Prepandial:   less than 140 mg/dL      Peak postprandial:   less than 180 mg/dL (1-2 hours)      Critically ill patients:  140 - 180 mg/dL   Lab Results  Component Value Date   GLUCAP 93 07/25/2020   HGBA1C 9.4 (H) 07/18/2020    Review of Glycemic Control Results for NASSER, KU (MRN [id number]) as of 07/25/2020 11:25  Ref. Range 07/25/2020 06:31 07/25/2020 08:02 07/25/2020 09:11 07/25/2020 10:08  Glucose-Capillary Latest Ref Range: 70 - 99 mg/dL 732 (H) 202 (H) 542 (H) 93   Diabetes history: DM 2 Outpatient Diabetes medications:  Humalog 15-40 units tid with meals, Lantus 60 units bid, Metformin 1000 mg bid, Victoza 1.2 mg daily Current orders for Inpatient glycemic control:  Levemir 10 units daily, TCTS q 4 hours  Inpatient Diabetes Program Recommendations:   Note that patient was on 120 units of Basal insulin prior to admit.   Consider increasing Levemir to 20 units bid?  Thanks,  Beryl Meager, RN, BC-ADM Inpatient Diabetes Coordinator Pager [phone] (8a-5p)

## 2020-07-25 NOTE — Progress Notes (Signed)
      [address]       [place] [address]             [phone]      POD # 1 CABG x 4  Sleeping currently  BP 106/70   Pulse 85   Temp 99.3 F (37.4 C)   Resp 13   Ht 6\' 5"  (1.956 m)   Wt (!) 172.2 kg   SpO2 100%   BMI 45.02 kg/m   Intake/Output Summary (Last 24 hours) at 07/25/2020 1858 Last data filed at 07/25/2020 1500 Gross per 24 hour  Intake 1578.05 ml  Output 1755 ml  Net -176.95 ml   K= 5.3, creatinine 1.91 up from 1.45- monitor  Hct= 24  CBG well controlled  09/24/2020 C. Viviann Spare, MD [facility] [phone]

## 2020-07-25 NOTE — Progress Notes (Signed)
EKG CRITICAL VALUE     12 lead EKG performed.  Critical value noted.  [place], RN notified.   Quintessa Simmerman 07/25/2020 7:22 AM

## 2020-07-25 NOTE — Consult Note (Signed)
WOC Nurse Consult Note: Patient receiving care in 2H21. Reason for Consult:"left toe infection" Wound type: Left 4th toe is very moist, the toenail fell off when I took the dressing off.  The patient states his boot rubbed his toe and caused the wound.  This is a neuropathic ulcer (diabetic foot ulcer) initiated by trauma to the toe. Pressure Injury POA: Yes/No/NA Measurement: 1.5 cm x 2 cm Wound bed: darkened.   Drainage (amount, consistency, odor) serous on existing gauze Periwound: entire foot is edematous, slightly erythematous, peeling skin, I could not palpate a dorsalis pedis pulse in the foot. Dressing procedure/placement/frequency: Twice daily application of mupirocin cream and gauze dressing. I have sent Dr. Joanna Puff a Secure Chat message to let him know I believe the patient needs an orthopedic consult. Monitor the wound area(s) for worsening of condition such as: Signs/symptoms of infection,  Increase in size,  Development of or worsening of odor, Development of pain, or increased pain at the affected locations.  Notify the medical team if any of these develop.  Thank you for the consult.  Discussed plan of care with the patient and bedside nurse.  WOC nurse will not follow at this time.  Please re-consult the WOC team if needed.  Helmut Muster, RN, MSN, CWOCN, CNS-BC, pager [phone]

## 2020-07-26 ENCOUNTER — Inpatient Hospital Stay (HOSPITAL_COMMUNITY): Payer: Medicare Other

## 2020-07-26 LAB — POCT I-STAT 7, (LYTES, BLD GAS, ICA,H+H)
Acid-base deficit: 4 mmol/L — ABNORMAL HIGH (ref 0.0–2.0)
Bicarbonate: 21.9 mmol/L (ref 20.0–28.0)
Calcium, Ion: 1.27 mmol/L (ref 1.15–1.40)
HCT: 33 % — ABNORMAL LOW (ref 39.0–52.0)
Hemoglobin: 11.2 g/dL — ABNORMAL LOW (ref 13.0–17.0)
O2 Saturation: 92 %
Patient temperature: 98.2
Potassium: 5.4 mmol/L — ABNORMAL HIGH (ref 3.5–5.1)
Sodium: 138 mmol/L (ref 135–145)
TCO2: 23 mmol/L (ref 22–32)
pCO2 arterial: 41.1 mmHg (ref 32.0–48.0)
pH, Arterial: 7.334 — ABNORMAL LOW (ref 7.350–7.450)
pO2, Arterial: 67 mmHg — ABNORMAL LOW (ref 83.0–108.0)

## 2020-07-26 LAB — CBC
HCT: 25.1 % — ABNORMAL LOW (ref 39.0–52.0)
HCT: 22.6 % — ABNORMAL LOW (ref 39.0–52.0)
Hemoglobin: 7.9 g/dL — ABNORMAL LOW (ref 13.0–17.0)
Hemoglobin: 7.1 g/dL — ABNORMAL LOW (ref 13.0–17.0)
MCH: 28.6 pg (ref 26.0–34.0)
MCH: 29 pg (ref 26.0–34.0)
MCHC: 31.5 g/dL (ref 30.0–36.0)
MCHC: 31.4 g/dL (ref 30.0–36.0)
MCV: 90.9 fL (ref 80.0–100.0)
MCV: 92.2 fL (ref 80.0–100.0)
Platelets: 270 10*3/uL (ref 150–400)
Platelets: 234 10*3/uL (ref 150–400)
RBC: 2.76 MIL/uL — ABNORMAL LOW (ref 4.22–5.81)
RBC: 2.45 MIL/uL — ABNORMAL LOW (ref 4.22–5.81)
RDW: 15.7 % — ABNORMAL HIGH (ref 11.5–15.5)
RDW: 15.4 % (ref 11.5–15.5)
WBC: 23 10*3/uL — ABNORMAL HIGH (ref 4.0–10.5)
WBC: 15.4 10*3/uL — ABNORMAL HIGH (ref 4.0–10.5)
nRBC: 0.3 % — ABNORMAL HIGH (ref 0.0–0.2)
nRBC: 1.2 % — ABNORMAL HIGH (ref 0.0–0.2)

## 2020-07-26 LAB — BASIC METABOLIC PANEL
Anion gap: 12 (ref 5–15)
BUN: 32 mg/dL — ABNORMAL HIGH (ref 6–20)
CO2: 21 mmol/L — ABNORMAL LOW (ref 22–32)
Calcium: 9.1 mg/dL (ref 8.9–10.3)
Chloride: 104 mmol/L (ref 98–111)
Creatinine, Ser: 2.22 mg/dL — ABNORMAL HIGH (ref 0.61–1.24)
GFR calc Af Amer: 39 mL/min — ABNORMAL LOW (ref >60)
GFR calc non Af Amer: 34 mL/min — ABNORMAL LOW (ref >60)
Glucose, Bld: 246 mg/dL — ABNORMAL HIGH (ref 70–99)
Potassium: 5.1 mmol/L (ref 3.5–5.1)
Sodium: 137 mmol/L (ref 135–145)

## 2020-07-26 LAB — GLUCOSE, CAPILLARY
Glucose-Capillary: 228 mg/dL — ABNORMAL HIGH (ref 70–99)
Glucose-Capillary: 205 mg/dL — ABNORMAL HIGH (ref 70–99)
Glucose-Capillary: 251 mg/dL — ABNORMAL HIGH (ref 70–99)
Glucose-Capillary: 232 mg/dL — ABNORMAL HIGH (ref 70–99)
Glucose-Capillary: 203 mg/dL — ABNORMAL HIGH (ref 70–99)
Glucose-Capillary: 170 mg/dL — ABNORMAL HIGH (ref 70–99)
Glucose-Capillary: 97 mg/dL (ref 70–99)

## 2020-07-26 LAB — PREPARE RBC (CROSSMATCH)

## 2020-07-26 LAB — COMPREHENSIVE METABOLIC PANEL
ALT: 76 U/L — ABNORMAL HIGH (ref 0–44)
AST: 56 U/L — ABNORMAL HIGH (ref 15–41)
Albumin: 2.9 g/dL — ABNORMAL LOW (ref 3.5–5.0)
Alkaline Phosphatase: 61 U/L (ref 38–126)
Anion gap: 10 (ref 5–15)
BUN: 39 mg/dL — ABNORMAL HIGH (ref 6–20)
CO2: 23 mmol/L (ref 22–32)
Calcium: 8.9 mg/dL (ref 8.9–10.3)
Chloride: 104 mmol/L (ref 98–111)
Creatinine, Ser: 2.02 mg/dL — ABNORMAL HIGH (ref 0.61–1.24)
GFR calc Af Amer: 44 mL/min — ABNORMAL LOW (ref >60)
GFR calc non Af Amer: 38 mL/min — ABNORMAL LOW (ref >60)
Glucose, Bld: 216 mg/dL — ABNORMAL HIGH (ref 70–99)
Potassium: 4.9 mmol/L (ref 3.5–5.1)
Sodium: 137 mmol/L (ref 135–145)
Total Bilirubin: 0.7 mg/dL (ref 0.3–1.2)
Total Protein: 5.9 g/dL — ABNORMAL LOW (ref 6.5–8.1)

## 2020-07-26 MED ORDER — SODIUM CHLORIDE 0.9% IV SOLUTION: Freq: Once | INTRAVENOUS | Status: AC

## 2020-07-26 MED ORDER — SODIUM CHLORIDE 0.9% FLUSH: 10.0000 mL | INTRAVENOUS | Status: DC | PRN

## 2020-07-26 MED ORDER — ALPRAZOLAM 0.5 MG PO TABS
0.5000 mg | ORAL_TABLET | Freq: Three times a day (TID) | ORAL | Status: DC | PRN
  Administered 2020-07-27 – 2020-07-28 (×3): 0.5 mg via ORAL
  Filled 2020-07-26 (×3): qty 1

## 2020-07-26 MED ORDER — INSULIN ASPART 100 UNIT/ML ~~LOC~~ SOLN
4.0000 [IU] | Freq: Three times a day (TID) | SUBCUTANEOUS | Status: DC
  Administered 2020-07-26 – 2020-07-31 (×16): 4 [IU] via SUBCUTANEOUS

## 2020-07-26 MED ORDER — CARVEDILOL 3.125 MG PO TABS
3.1250 mg | ORAL_TABLET | Freq: Two times a day (BID) | ORAL | Status: DC
  Administered 2020-07-26 – 2020-07-31 (×9): 3.125 mg via ORAL
  Filled 2020-07-26 (×11): qty 1

## 2020-07-26 MED ORDER — BUPROPION HCL ER (SR) 150 MG PO TB12
150.0000 mg | ORAL_TABLET | Freq: Two times a day (BID) | ORAL | Status: DC
  Administered 2020-07-26 – 2020-07-31 (×11): 150 mg via ORAL
  Filled 2020-07-26 (×13): qty 1

## 2020-07-26 MED ORDER — INSULIN DETEMIR 100 UNIT/ML ~~LOC~~ SOLN
25.0000 [IU] | Freq: Every day | SUBCUTANEOUS | Status: DC
  Administered 2020-07-27 – 2020-07-29 (×3): 25 [IU] via SUBCUTANEOUS
  Filled 2020-07-26 (×5): qty 0.25

## 2020-07-26 MED ORDER — INSULIN ASPART 100 UNIT/ML ~~LOC~~ SOLN
0.0000 [IU] | Freq: Every day | SUBCUTANEOUS | Status: DC
  Administered 2020-07-27: 3 [IU] via SUBCUTANEOUS
  Administered 2020-07-28 – 2020-07-30 (×3): 2 [IU] via SUBCUTANEOUS

## 2020-07-26 MED ORDER — MECLIZINE HCL 25 MG PO TABS
25.0000 mg | ORAL_TABLET | Freq: Three times a day (TID) | ORAL | Status: DC | PRN
  Filled 2020-07-26: qty 1

## 2020-07-26 MED ORDER — INSULIN ASPART 100 UNIT/ML ~~LOC~~ SOLN
0.0000 [IU] | Freq: Three times a day (TID) | SUBCUTANEOUS | Status: DC
  Administered 2020-07-26 (×2): 11 [IU] via SUBCUTANEOUS
  Administered 2020-07-27: 7 [IU] via SUBCUTANEOUS
  Administered 2020-07-27: 4 [IU] via SUBCUTANEOUS
  Administered 2020-07-27: 7 [IU] via SUBCUTANEOUS
  Administered 2020-07-28: 11 [IU] via SUBCUTANEOUS
  Administered 2020-07-28 – 2020-07-30 (×6): 7 [IU] via SUBCUTANEOUS
  Administered 2020-07-30: 4 [IU] via SUBCUTANEOUS
  Administered 2020-07-30 – 2020-07-31 (×3): 7 [IU] via SUBCUTANEOUS

## 2020-07-26 MED ORDER — ESCITALOPRAM OXALATE 10 MG PO TABS
10.0000 mg | ORAL_TABLET | Freq: Every day | ORAL | Status: DC
  Administered 2020-07-26 – 2020-07-31 (×6): 10 mg via ORAL
  Filled 2020-07-26 (×6): qty 1

## 2020-07-26 MED ORDER — PREGABALIN 75 MG PO CAPS
150.0000 mg | ORAL_CAPSULE | Freq: Three times a day (TID) | ORAL | Status: DC
  Administered 2020-07-26 – 2020-07-31 (×16): 150 mg via ORAL
  Filled 2020-07-26 (×16): qty 2

## 2020-07-26 MED ORDER — SODIUM CHLORIDE 0.9% FLUSH
10.0000 mL | Freq: Two times a day (BID) | INTRAVENOUS | Status: DC
  Administered 2020-07-26 – 2020-07-31 (×7): 10 mL

## 2020-07-26 NOTE — Progress Notes (Signed)
2 Days Post-Op Procedure(s) (LRB): CORONARY ARTERY BYPASS GRAFTING (CABG), ON PUMP, TIMES FOUR, USING LEFT INTERNAL MAMMARY ARTERY AND ENDOSCOPICALLY HARVESTED RIGHT GREATER SAPHENOUS VEIN (N/A) TRANSESOPHAGEAL ECHOCARDIOGRAM (TEE) (N/A) Subjective: Some confusion overnight and then again this morning Oriented to person, not place or time. Calm and cooperative. Confusion this AM started after oxycodone  Objective: Vital signs in last 24 hours: Temp:  [98 F (36.7 C)-99.3 F (37.4 C)] 98.3 F (36.8 C) (08/07 0700) Pulse Rate:  [76-92] 92 (08/07 0625) Cardiac Rhythm: Normal sinus rhythm (08/06 2000) Resp:  [13-25] 23 (08/07 0625) BP: (91-138)/(52-81) 123/76 (08/07 0625) SpO2:  [90 %-100 %] 93 % (08/07 0625) Arterial Line BP: (75)/(53) 75/53 (08/06 1100)  Hemodynamic parameters for last 24 hours:    Intake/Output from previous day: 08/06 0701 - 08/07 0700 In: -  Out: 1085 [Urine:805; Drains:40; Chest Tube:240] Intake/Output this shift: No intake/output data recorded.  General appearance: no distress and slowed mentation Neurologic: intact Heart: regular rate and rhythm Lungs: diminished breath sounds bibasilar Abdomen: normal findings: soft, non-tender Wound: VAC in place  Lab Results: Recent Labs    07/25/20 1722 07/25/20 1722 07/26/20 0201 07/26/20 0358  WBC 14.1*  --  23.0*  --   HGB 7.4*   < > 7.9* 11.2*  HCT 23.6*   < > 25.1* 33.0*  PLT 200  --  270  --    < > = values in this interval not displayed.   BMET:  Recent Labs    07/25/20 1722 07/25/20 1722 07/26/20 0201 07/26/20 0358  NA 137   < > 137 138  K 5.3*   < > 5.1 5.4*  CL 106  --  104  --   CO2 23  --  21*  --   GLUCOSE 220*  --  246*  --   BUN 28*  --  32*  --   CREATININE 1.91*  --  2.22*  --   CALCIUM 8.5*  --  9.1  --    < > = values in this interval not displayed.    PT/INR:  Recent Labs    07/24/20 1652  LABPROT 17.1*  INR 1.5*   ABG    Component Value Date/Time   PHART 7.334  (L) 07/26/2020 0358   HCO3 21.9 07/26/2020 0358   TCO2 23 07/26/2020 0358   ACIDBASEDEF 4.0 (H) 07/26/2020 0358   O2SAT 92.0 07/26/2020 0358   CBG (last 3)  Recent Labs    07/25/20 2353 07/26/20 0413 07/26/20 0705  GLUCAP 190* 228* 205*    Assessment/Plan: S/P Procedure(s) (LRB): CORONARY ARTERY BYPASS GRAFTING (CABG), ON PUMP, TIMES FOUR, USING LEFT INTERNAL MAMMARY ARTERY AND ENDOSCOPICALLY HARVESTED RIGHT GREATER SAPHENOUS VEIN (N/A) TRANSESOPHAGEAL ECHOCARDIOGRAM (TEE) (N/A) - POD # 2 CABG NEURO- some confusion, no focal deficits  Was on multiple psychoactive meds preop- will resume  Stop oxycodone  Monitor CV- in SR- coreg, lipitor, ASA RESP- Atelectasis on CXR- IS RENAL- creatinine up to 2.2 from 1.9, c/w AKI  Diuresed yesterday - will hold off today ENDO- CBG elevated- adjust levemir, SSI. Add meal coverage Anemia secondary to ABL- stable, follow Mobilize as tolerated   LOS: 7 days    Loreli Slot 07/26/2020

## 2020-07-26 NOTE — Progress Notes (Signed)
      [address]       [place] [address]             [phone]      Up in chair. Sleeping currently  BP 92/75 (BP Location: Right Arm)   Pulse 97   Temp 98.8 F (37.1 C) (Oral)   Resp 19   Ht 6\' 5"  (1.956 m)   Wt (!) 172.2 kg   SpO2 99%   BMI 45.02 kg/m   Intake/Output Summary (Last 24 hours) at 07/26/2020 1759 Last data filed at 07/26/2020 1600 Gross per 24 hour  Intake 447.53 ml  Output 1020 ml  Net -572.47 ml   CBG still elevated- DM meds adjusted earlier today  Niaya Hickok C. 09/25/2020, MD [facility] [phone]

## 2020-07-26 NOTE — Progress Notes (Signed)
Pt with new onset confusion and delayed responses. Pt was able to verbalize and answer questions appropriately during previous assessment. Rapid response was called to the bedside for a second opinion and to rule out any stroke like symptoms. RRT obtained a blood gas and pt's morning labs were sent early. Once labs and blood gas resulted, MD Dorris Fetch was notified and made aware. No new orders were given. Nurse will continue to monitor and observe pt closely this shift. Pt is now resting comfortably with his eyes closed.

## 2020-07-26 NOTE — Evaluation (Signed)
Physical Therapy Evaluation Patient Details Name: Joseph [name] MRN: [id number] DOB: Oct 20, 1971 Today's Date: 07/26/2020   History of Present Illness  Pt adm with hypotension and elevated troponins. Pt found to have severe CAD and underwent CABG x 4 on 8/5. PMH - morbid obesity, CAD, HTN, DM, PE, DVT, copd, depression, bil rotator cuff repair, arthritis  Clinical Impression  Pt presented to PT with decr mobility and decr cognition. Pt was very groggy (had meds earlier). Assuming cognition clears expect he will make good progress with mobility. Pt wasn't really able to tell me about his home support. If pt doesn't progress or he doesn't have home support will then need to consider other post acute options.     Follow Up Recommendations Home health PT;Supervision/Assistance - 24 hour    Equipment Recommendations  Rolling walker with 5" wheels    Recommendations for Other Services       Precautions / Restrictions Precautions Precautions: Sternal;Fall      Mobility  Bed Mobility Overal bed mobility: Needs Assistance Bed Mobility: Supine to Sit     Supine to sit: +2 for physical assistance;Mod assist     General bed mobility comments: Assist to bring legs off of bed and elevate trunk into sitting. Pt slow to initiate any movement  Transfers Overall transfer level: Needs assistance Equipment used: 4-wheeled walker (EVA walker) Transfers: Sit to/from Stand Sit to Stand: +2 physical assistance;Min assist         General transfer comment: Assist to bring hips up. Pt pulling up on EVA walker with UE's despite mulitple cues not to to maintain sternal precautions. When going to sit pt unable to process commands to bring hips UE's off of the EVA walker prior to initiating sitting. We had to physically bring his arms off of the walker prior to sitting.   Ambulation/Gait Ambulation/Gait assistance: Min assist;+2 safety/equipment Gait Distance (Feet): 50 Feet Assistive device:  4-wheeled walker (EVA walker) Gait Pattern/deviations: Step-through pattern;Decreased step length - right;Decreased step length - left;Decreased stride length;Trunk flexed Gait velocity: decr Gait velocity interpretation: <1.31 ft/sec, indicative of household ambulator General Gait Details: Assist for balance and support. Verbal cues to stand more erect  Stairs            Wheelchair Mobility    Modified Rankin (Stroke Patients Only)       Balance Overall balance assessment: Needs assistance Sitting-balance support: No upper extremity supported;Feet supported Sitting balance-Leahy Scale: Fair     Standing balance support: Bilateral upper extremity supported Standing balance-Leahy Scale: Poor Standing balance comment: walker and min assist for static standing                             Pertinent Vitals/Pain Pain Assessment: Faces Faces Pain Scale: Hurts a little bit Pain Location: generalized Pain Descriptors / Indicators: Grimacing Pain Intervention(s): Monitored during session    Home Living Family/patient expects to be discharged to:: Private residence Living Arrangements: Spouse/significant other;Children Available Help at Discharge: Family;Available PRN/intermittently Type of Home: Mobile home Home Access: Stairs to enter Entrance Stairs-Rails: Right Entrance Stairs-Number of Steps: 4 Home Layout: One level Home Equipment: Shower seat;Crutches      Prior Function Level of Independence: Independent               Hand Dominance        Extremity/Trunk Assessment   Upper Extremity Assessment Upper Extremity Assessment: Defer to OT evaluation    Lower  Extremity Assessment Lower Extremity Assessment: Generalized weakness       Communication   Communication: No difficulties  Cognition Arousal/Alertness: Lethargic Behavior During Therapy: Flat affect Overall Cognitive Status: No family/caregiver present to determine baseline  cognitive functioning Area of Impairment: Attention;Memory;Following commands;Safety/judgement;Awareness;Problem solving                   Current Attention Level: Sustained Memory: Decreased recall of precautions;Decreased short-term memory Following Commands: Follows one step commands with increased time Safety/Judgement: Decreased awareness of safety;Decreased awareness of deficits Awareness: Intellectual Problem Solving: Slow processing;Decreased initiation;Difficulty sequencing;Requires verbal cues;Requires tactile cues        General Comments General comments (skin integrity, edema, etc.): Pt on 2L of O2 with VSS. BP 80's/60's at rest. BP 90's/70's after amb    Exercises     Assessment/Plan    PT Assessment Patient needs continued PT services  PT Problem List Decreased strength;Decreased activity tolerance;Decreased balance;Decreased mobility;Decreased cognition;Decreased safety awareness;Decreased knowledge of precautions;Obesity       PT Treatment Interventions DME instruction;Gait training;Functional mobility training;Therapeutic activities;Therapeutic exercise;Balance training;Patient/family education    PT Goals (Current goals can be found in the Care Plan section)  Acute Rehab PT Goals Patient Stated Goal: not stated PT Goal Formulation: Patient unable to participate in goal setting Time For Goal Achievement: 08/09/20 Potential to Achieve Goals: Good    Frequency Min 3X/week   Barriers to discharge        Co-evaluation               AM-PAC PT "6 Clicks" Mobility  Outcome Measure Help needed turning from your back to your side while in a flat bed without using bedrails?: A Lot Help needed moving from lying on your back to sitting on the side of a flat bed without using bedrails?: A Lot Help needed moving to and from a bed to a chair (including a wheelchair)?: A Lot Help needed standing up from a chair using your arms (e.g., wheelchair or bedside  chair)?: A Little Help needed to walk in hospital room?: A Little Help needed climbing 3-5 steps with a railing? : Total 6 Click Score: 13    End of Session Equipment Utilized During Treatment: Oxygen Activity Tolerance: Patient limited by lethargy Patient left: in chair;with call bell/phone within reach;with chair alarm set Nurse Communication: Mobility status PT Visit Diagnosis: Other abnormalities of gait and mobility (R26.89);Muscle weakness (generalized) (M62.81)    Time: 4008-6761 PT Time Calculation (min) (ACUTE ONLY): 30 min   Charges:   PT Evaluation $PT Eval Moderate Complexity: 1 Mod PT Treatments $Gait Training: 8-22 mins        [facility] PT Acute Rehabilitation Services Pager [phone] Office [phone]   Angelina Ok [facility] 07/26/2020, 3:25 PM

## 2020-07-26 NOTE — Progress Notes (Signed)
PT Cancellation Note  Patient Details Name: Joseph [name] MRN: [id number] DOB: Mar 15, 1971   Cancelled Treatment:    Reason Eval/Treat Not Completed: Patient at procedure or test/unavailable. IV team placing a line and pt just back to bed with nursing. Will see later in PM.    Angelina Ok [facility] 07/26/2020, 11:04 AM Skip Mayer PT Acute Rehabilitation Services Pager [phone] Office [phone]

## 2020-07-26 NOTE — Significant Event (Signed)
Rapid Response Event Note   Reason for Call :  Asked to see pt as a second set of eye. Initial Focused Assessment:  Pt laying in bed with eyes closed. Pt will awaken to repeated verbal stimulation, follow commands, and move all extremities equally. He is oriented to person and place.  Pt falls back to sleep very easily when not stimulated. Pupils 2 and sluggish. NIH-10 for lethargy, equal weakness in all extremities and inability to state age and month.  Per RN, pt was alert and oriented and able to have a conversation earlier in the night. Now, he is lethargic and confused, with delayed responses.   Pt received oxy IR 10mg  at 2030 and 0025.  Interventions:  ABG  Plan of Care:  Pt has no focal deficits. He is equally weak in all extremities. He is lethargic and very confused.  ABG ordered. RN will relay results and notify MD of change in LOC.    Event Summary:   MD Notified: per 2H RN  Call [phone] Arrival [phone] End [id number]  [id number], RN

## 2020-07-27 LAB — COMPREHENSIVE METABOLIC PANEL
ALT: 73 U/L — ABNORMAL HIGH (ref 0–44)
AST: 43 U/L — ABNORMAL HIGH (ref 15–41)
Albumin: 2.9 g/dL — ABNORMAL LOW (ref 3.5–5.0)
Alkaline Phosphatase: 39 U/L (ref 38–126)
Anion gap: 11 (ref 5–15)
BUN: 37 mg/dL — ABNORMAL HIGH (ref 6–20)
CO2: 23 mmol/L (ref 22–32)
Calcium: 8.9 mg/dL (ref 8.9–10.3)
Chloride: 104 mmol/L (ref 98–111)
Creatinine, Ser: 1.87 mg/dL — ABNORMAL HIGH (ref 0.61–1.24)
GFR calc Af Amer: 49 mL/min — ABNORMAL LOW (ref >60)
GFR calc non Af Amer: 42 mL/min — ABNORMAL LOW (ref >60)
Glucose, Bld: 205 mg/dL — ABNORMAL HIGH (ref 70–99)
Potassium: 4.4 mmol/L (ref 3.5–5.1)
Sodium: 138 mmol/L (ref 135–145)
Total Bilirubin: 0.5 mg/dL (ref 0.3–1.2)
Total Protein: 6 g/dL — ABNORMAL LOW (ref 6.5–8.1)

## 2020-07-27 LAB — CBC
HCT: 24.1 % — ABNORMAL LOW (ref 39.0–52.0)
Hemoglobin: 7.7 g/dL — ABNORMAL LOW (ref 13.0–17.0)
MCH: 29.5 pg (ref 26.0–34.0)
MCHC: 32 g/dL (ref 30.0–36.0)
MCV: 92.3 fL (ref 80.0–100.0)
Platelets: 244 10*3/uL (ref 150–400)
RBC: 2.61 MIL/uL — ABNORMAL LOW (ref 4.22–5.81)
RDW: 15.1 % (ref 11.5–15.5)
WBC: 14.3 10*3/uL — ABNORMAL HIGH (ref 4.0–10.5)
nRBC: 1 % — ABNORMAL HIGH (ref 0.0–0.2)

## 2020-07-27 LAB — GLUCOSE, CAPILLARY
Glucose-Capillary: 166 mg/dL — ABNORMAL HIGH (ref 70–99)
Glucose-Capillary: 220 mg/dL — ABNORMAL HIGH (ref 70–99)
Glucose-Capillary: 246 mg/dL — ABNORMAL HIGH (ref 70–99)
Glucose-Capillary: 261 mg/dL — ABNORMAL HIGH (ref 70–99)

## 2020-07-27 LAB — BRAIN NATRIURETIC PEPTIDE: B Natriuretic Peptide: 271 pg/mL — ABNORMAL HIGH (ref 0.0–100.0)

## 2020-07-27 LAB — TYPE AND SCREEN
ABO/RH(D): O POS
Antibody Screen: NEGATIVE

## 2020-07-27 MED ORDER — MIDODRINE HCL 5 MG PO TABS
5.0000 mg | ORAL_TABLET | Freq: Three times a day (TID) | ORAL | Status: DC
  Administered 2020-07-27 – 2020-07-31 (×13): 5 mg via ORAL
  Filled 2020-07-27 (×13): qty 1

## 2020-07-27 MED ORDER — OXYCODONE HCL 5 MG PO TABS
5.0000 mg | ORAL_TABLET | ORAL | Status: DC | PRN
  Administered 2020-07-27 – 2020-07-30 (×13): 10 mg via ORAL
  Filled 2020-07-27 (×13): qty 2

## 2020-07-27 MED ORDER — FUROSEMIDE 10 MG/ML IJ SOLN
20.0000 mg | Freq: Once | INTRAMUSCULAR | Status: AC
  Administered 2020-07-27: 20 mg via INTRAVENOUS
  Filled 2020-07-27: qty 2

## 2020-07-27 NOTE — Progress Notes (Signed)
      [address]       [place] [address]             [phone]      Feels well this evening. Pain better with oxycodone restarted Ambulated around unit earlier BP 99/64   Pulse 82   Temp 97.8 F (36.6 C) (Oral)   Resp 14   Ht 6\' 5"  (1.956 m)   Wt (!) 170.9 kg   SpO2 96%   BMI 44.68 kg/m   Intake/Output Summary (Last 24 hours) at 07/27/2020 1942 Last data filed at 07/27/2020 1200 Gross per 24 hour  Intake 1329 ml  Output 1780 ml  Net -451 ml   Continue current Rx  Karma Ansley C. 09/26/2020, MD [facility] [phone]

## 2020-07-27 NOTE — Discharge Instructions (Signed)

## 2020-07-27 NOTE — Progress Notes (Signed)
3 Days Post-Op Procedure(s) (LRB): CORONARY ARTERY BYPASS GRAFTING (CABG), ON PUMP, TIMES FOUR, USING LEFT INTERNAL MAMMARY ARTERY AND ENDOSCOPICALLY HARVESTED RIGHT GREATER SAPHENOUS VEIN (N/A) TRANSESOPHAGEAL ECHOCARDIOGRAM (TEE) (N/A) Subjective: Feels better today. More alert  Requesting oxycodone for pain as he is on that at home  Objective: Vital signs in last 24 hours: Temp:  [97.5 F (36.4 C)-98.9 F (37.2 C)] 97.5 F (36.4 C) (08/08 0754) Pulse Rate:  [74-97] 93 (08/08 0700) Cardiac Rhythm: Normal sinus rhythm (08/08 0800) Resp:  [10-24] 15 (08/08 0800) BP: (75-110)/(44-84) 91/44 (08/08 0800) SpO2:  [94 %-100 %] 97 % (08/08 0700) Weight:  [170.9 kg] 170.9 kg (08/08 0500)  Hemodynamic parameters for last 24 hours:    Intake/Output from previous day: 08/07 0701 - 08/08 0700 In: 1776.5 [P.O.:410; I.V.:797.5; Blood:269; IV Piggyback:300] Out: 1980 [Urine:1750; Drains:80; Chest Tube:150] Intake/Output this shift: No intake/output data recorded.  General appearance: alert, cooperative and no distress Neurologic: intact Heart: regular rate and rhythm Lungs: diminished breath sounds bibasilar Abdomen: normal findings: soft, non-tender Wound: VAC in place  Lab Results: Recent Labs    07/26/20 2041 07/27/20 0331  WBC 15.4* 14.3*  HGB 7.1* 7.7*  HCT 22.6* 24.1*  PLT 234 244   BMET:  Recent Labs    07/26/20 2043 07/27/20 0331  NA 137 138  K 4.9 4.4  CL 104 104  CO2 23 23  GLUCOSE 216* 205*  BUN 39* 37*  CREATININE 2.02* 1.87*  CALCIUM 8.9 8.9    PT/INR:  Recent Labs    07/24/20 1652  LABPROT 17.1*  INR 1.5*   ABG    Component Value Date/Time   PHART 7.334 (L) 07/26/2020 0358   HCO3 21.9 07/26/2020 0358   TCO2 23 07/26/2020 0358   ACIDBASEDEF 4.0 (H) 07/26/2020 0358   O2SAT 92.0 07/26/2020 0358   CBG (last 3)  Recent Labs    07/26/20 2102 07/26/20 2103 07/27/20 0645  GLUCAP 97 170* 166*    Assessment/Plan: S/P Procedure(s)  (LRB): CORONARY ARTERY BYPASS GRAFTING (CABG), ON PUMP, TIMES FOUR, USING LEFT INTERNAL MAMMARY ARTERY AND ENDOSCOPICALLY HARVESTED RIGHT GREATER SAPHENOUS VEIN (N/A) TRANSESOPHAGEAL ECHOCARDIOGRAM (TEE) (N/A) -Looks much better today More alert and interactive CV- in SR, BP still relatively low, beta blocker held  Will add low dose midodrine RESP- IS for atelectasis RENAL- creatinine improved down to 1.87  C/w resolving AKI ENDO- CBG moderately elevated, continue present regimen Anemia secondary to ABL- improved post transfusion, follow Mobilize SCD + enoxaparin for DVT prophylaxis Will resume oxycodone per patient request   LOS: 8 days    Loreli Slot 07/27/2020
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
## 2020-07-27 NOTE — Plan of Care (Signed)
Pt doing ok. Pale and lethargic upon initial assessment with low SBP. Heart sound distant but not muffled, and chest tubes thoroughly assessed, milked and tapped with hemostats to detect/prevent clots. Stat labwork obtained--Hgb trending down 7.9-->7.1. Hendrickson MD notified--orders to give 1 unit PRBC and 20mg  lasix after blood has transfused. Pt tolerated both with no issues. After blood, SBP improved. Pt comfortably sleeping in bed so will reassess mental status when awake. Plan to mobilize in AM. RN will continue to monitor closely.   Problem: Clinical Measurements: Goal: Ability to maintain clinical measurements within normal limits will improve Outcome: Progressing Goal: Cardiovascular complication will be avoided Outcome: Progressing   Problem: Respiratory: Goal: Ability to maintain adequate ventilation will improve Outcome: Progressing   Problem: Cardiovascular: Goal: Ability to achieve and maintain adequate cardiovascular perfusion will improve Outcome: Progressing Goal: Vascular access site(s) Level 0-1 will be maintained Outcome: Progressing

## 2020-07-27 NOTE — Discharge Summary (Signed)
Physician Discharge Summary  Patient ID: Joseph [name] MRN: [id number] DOB/AGE: Dec 05, 1971 49 y.o.  Admit date: 07/17/2020 Discharge date: 07/31/2020  Admission Diagnoses: Patient Active Problem List   Diagnosis Date Noted    07/24/2020  . Non-ST elevation (NSTEMI) myocardial infarction ([place]) 07/22/2020  . Acute on chronic diastolic heart failure ([place]) 07/22/2020  . Hypotension 07/18/2020  . Left leg swelling 07/18/2020  . Elevated troponin 07/18/2020  . AKI (acute kidney injury) ([place]) 07/18/2020  . Elevated brain natriuretic peptide (BNP) level 07/18/2020  . GERD (gastroesophageal reflux disease) 07/18/2020  . Severe Sepsis secondary to Lt leg/Foot Cellulitis 07/18/2020  . Severe sepsis --Severe Sepsis secondary to Lt leg/Foot Cellulitis with hypotension and AKI 07/18/2020  . Unstable angina ([place]) 11/01/2019  . Edema 06/13/2019  . Leukocytosis 05/31/2019  . Smoker 03/08/2019  . Chronic pain 03/08/2019  . Chronic back pain 02/16/2019  . Deep venous thrombosis ([place]) 02/16/2019  . Obesity, Class III, BMI 40-49.9 (morbid obesity) ([place]) 02/16/2019  . Peripheral nerve disease 02/16/2019  . COPD (chronic obstructive pulmonary disease) ([place]) 01/22/2019  . Anxiety disorder 03/26/2018  . Open wound of toe 10/12/2017  . Sinusitis 10/12/2017  . Abdominal pain   . Loss of weight   . Diabetic foot infection ([place]) 09/16/2017  . Uncontrolled type 2 diabetes mellitus with hyperglycemia, with long-term current use of insulin ([place]) 09/16/2017  . Cellulitis of right foot   . Chronic ulcer of great toe of right foot ([place]) 09/09/2017  . Recurrent chest pain 07/29/2016  . Hyperglycemia   . Insulin dependent type 2 diabetes mellitus ([place]) 04/29/2015  . OSA (obstructive sleep apnea) 05/08/2013  . History of pulmonary embolus (PE) 04/27/2013  . CAD -S/P PCI 11/02/19  09/10/2008  . Dyslipidemia 09/09/2008     Discharge Diagnoses:  Principal Problem:   Severe sepsis --Severe Sepsis secondary  to Lt leg/Foot Cellulitis with hypotension and AKI Active Problems:   Dyslipidemia   CAD -S/P PCI 11/02/19    History of pulmonary embolus (PE)   OSA (obstructive sleep apnea)   Insulin dependent type 2 diabetes mellitus (HCC)   Hyperglycemia   COPD (chronic obstructive pulmonary disease) (HCC)   Obesity, Class III, BMI 40-49.9 (morbid obesity) (HCC)   Leukocytosis   Hypotension   Left leg swelling   Elevated troponin   AKI (acute kidney injury) (HCC)   Elevated brain natriuretic peptide (BNP) level   GERD (gastroesophageal reflux disease)   Acute Non-ST elevation (NSTEMI) myocardial infarction ([place])   Acute on chronic diastolic heart failure (HCC)   S/P CABG x 4   Acute kidney injury    Discharged Condition: good  History of Present Illness:      The patient is a 49 year old male with past medical history significant for coronary artery disease status post DES to the RCA and a DES to the mid circ (11/02/2019), history of DVT with PE (on Xarelto), current smoker, insulin-dependent type 2 uncontrolled diabetes mellitus with diabetic foot infection, COPD, obesity, dyslipidemia, OSA, AKI, GERD, hypertension, anxiety disorder, and chronic back pain who presented to the emergency department on 07/18/2020 with a complaint of fatigue, shortness of breath, weakness and lightheadedness.  His blood pressure was 77/64 on arrival.  He had a headache and some tightness in his chest at that time.  He denies diaphoresis, palpitations, nausea, vomiting, or abdominal pain.  Work-up in the emergency department showed his troponin of 553, coronavirus test was negative.  He was treated with full dose aspirin, IV normal saline  1 L, and started on IV heparin.  Cardiology was consulted and cardiac cath was performed on 07/22/2020. Cardiac catheterization showed mid left main to ostial LAD lesion of 70% with 55% stenosis of the side branch in the ostial circumflex to mid circumflex, mid circumflex first lesion of  60% with a 50% stenosis of the side branch in the first marginal, first diagonal lesion is 60% stenosed and third diagonal lesion is 60% stenosed, distal RCA lesion is 50% stenosed with 65% stenosis of the side branch in the right PDA.  No significant in-stent stenosis.  Echocardiogram performed on admission showed an estimated left ventricular ejection fraction of 50 to 55%, no significant valvular disease.  We are consulted for coronary artery bypass surgery.  The patient is currently on disability and is worried about paying for his medications.  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  He has chronic back pain in addition to knee and shoulder pain from previous surgeries.  The patient has a wife and 59 year old son.   Marland Kitchen Hospital Course:  Mr. Mutch remained stable following the left heart catheterization. A P2Y12 assay was obtained that demonstrated minimal platelet function inhibition so we made plans to proceed with surgery. He was taken to the OR on 07/24/20 where CABG x 4 was carried out with the left internal mammary artery grafted to the left anterior descending coronary artery and saphenous vein grafts to the first OM, PDA, and first diagonal coronary arteries.  Following the procedure, he separated from cardiopulmonary bypass without difficulty and was transferred to the cardiovascular ICU.  He remained hemodynamically stable and he did not require any inotropic support. He was weaned from the ventilator on the evening of surgery and extubated at around 11pm.  He had developed some acute renal insufficiency by the first postoperative day with a creatinine that peaked at 2.2 on the second post op day then began to trend back down. He also had an expected acute blood loss anemia for which he was transfused with PRBC's with appropriate response. The chest tube drainage gradually tapered off and the tubes were removed.  Glucose was monitored and sliding  scale insulin was provided as required. His confusion cleared. He was started back on oxycodone as he was taking at home prior to admission. A wound care consult was requested for management of the left 4th toe that was noted to have a diabetic ulcer on admission and dressing changes performed as recommended.   By the 4th post-op day, he was ready to transfer out of the ICU to [facility]. PT and OT were consulted and assisted with mobility.  He continued to progress on the floor. We continued to encourage incentive spirometer and ambulation. Today, he is tolerating room air, his incisions are healing well, he is ambulating with limited assistance, and he is ready for discharge home.     Consults: None  Significant Diagnostic Studies:   INTRAVASCULAR PRESSURE WIRE/FFR STUDY  LEFT HEART CATH AND CORONARY ANGIOGRAPHY  Conclusion    LV end diastolic pressure is severely elevated -consistent with ACUTE DIASTOLIC HEART FAILURE  Mid LM to Ost LAD lesion is 70% stenosed with 55% stenosed side branch in Ost Cx to Mid Cx.  Mid Cx-1 lesion is 60% stenosed with 50% stenosed side branch in 1st Mrg.  Mid Cx-2 overlapping BMS and DES stent segment is 5% stenosed.  1st Diag lesion is 60% stenosed. 3rd Diag lesion is 60% stenosed.  Prox RCA  to Mid RCA stented segment is 25% stenosed.  Dist RCA lesion is 50% stenosed with 65% stenosed side branch in RPDA.   -------------------------------------------- \SUMMARY  Severe distal LEFT MAIN 70% stenosis (highly positive DFR 0.85 and proximal LAD) ? Relatively normal LAD with minimal disease, 1st & 3rd Diag having proximal 50 to 60% stenosis. ? Diffuse 55% calcified proximal to mid LCx with a focal 60% stenosis just prior to overlapping VISION BMS, and RESOLUTE ONYX DES in the main LCx prior to bifurcation.  Minimal RCA disease with roughly 20% ISR in the stented segment of the RCA.  Ostial PDA 50-60%  LV gram not performed due to severely  elevated LVEDP of 33 mmHg -> consistent with ACUTE DIASTOLIC HEART FAILURE (will need echocardiogram to assess EF)   RECOMMENDATIONS:  With severely LM and proximal LCx disease, will refer for CVTS consultation. -->  Plavix has been discontinued  He would likely need several days for Plavix washout -> he will also need aggressive blood pressure management along with diuresis. ? I have ordered Lasix 40 mg IV twice daily starting this evening.  Restart IV heparin 8 hours after sheath removal.  Continue aggressive CRF Guideline Directed Therapy.    Glenetta Hew, MD   Coronary Diagrams  Diagnostic Dominance: Right    ECHOCARDIOGRAM REPORT       Patient Name:  Joseph [name] [facility] Date of Exam: 07/18/2020  Medical Rec #: [id number]    Height:    77.0 in  Accession #:  [id number]   Weight:    350.0 lb  Date of Birth: 06-Mar-1971    BSA:     2.837 m  Patient Age:  68 years    BP:      109/78 mmHg  Patient Gender: M        HR:      83 bpm.  Exam Location: [facility]   Procedure: 2D Echo and Intracardiac Opacification Agent   Indications:  CHF-Acute Diastolic 151.76 / H60.73    History:    Patient has prior history of Echocardiogram examinations,  most         recent 11/02/2019. CAD, COPD; Risk Factors:Current Smoker,         Diabetes and Dyslipidemia. Elevated Troponin, GERD, Acute  Kidney         Injury, History of Pulmonary Embolus.    Sonographer:  Leavy Cella RDCS (AE)  Referring Phys: [id number] OLADAPO ADEFESO   IMPRESSIONS    1. Left ventricular ejection fraction, by estimation, is 50 to 55%. The  left ventricle has low normal function. Left ventricular endocardial  border not optimally defined to evaluate regional wall motion. There is  mild left ventricular hypertrophy. Left  ventricular diastolic parameters were normal.  2. Right ventricular systolic function is  normal. The right ventricular  size is normal. Tricuspid regurgitation signal is inadequate for assessing  PA pressure.  3. The mitral valve is grossly normal. No evidence of mitral valve  regurgitation.  4. The aortic valve was not well visualized. Aortic valve regurgitation  is not visualized.  5. The inferior vena cava is normal in size with greater than 50%  respiratory variability, suggesting right atrial pressure of 3 mmHg.   FINDINGS  Left Ventricle: Left ventricular ejection fraction, by estimation, is 50  to 55%. The left ventricle has low normal function. Left ventricular  endocardial border not optimally defined to evaluate regional wall motion.  Definity contrast agent was given IV  to delineate the left  ventricular endocardial borders. The left  ventricular internal cavity size was normal in size. There is mild left  ventricular hypertrophy. Left ventricular diastolic parameters were  normal.   Right Ventricle: The right ventricular size is normal. No increase in  right ventricular wall thickness. Right ventricular systolic function is  normal. Tricuspid regurgitation signal is inadequate for assessing PA  pressure.   Left Atrium: Left atrial size was normal in size.   Right Atrium: Right atrial size was normal in size.   Pericardium: There is no evidence of pericardial effusion.   Mitral Valve: The mitral valve is grossly normal. No evidence of mitral  valve regurgitation.   Tricuspid Valve: The tricuspid valve is not well visualized. Tricuspid  valve regurgitation is trivial.   Aortic Valve: The aortic valve was not well visualized. Aortic valve  regurgitation is not visualized.   Pulmonic Valve: The pulmonic valve was not well visualized. Pulmonic valve  regurgitation is not visualized.   Aorta: The aortic root is normal in size and structure.   Venous: The inferior vena cava is normal in size with greater than 50%  respiratory variability,  suggesting right atrial pressure of 3 mmHg.   IAS/Shunts: No atrial level shunt detected by color flow Doppler.     LEFT VENTRICLE  PLAX 2D  LVIDd:     4.76 cm Diastology  LVIDs:     4.09 cm LV e' lateral:  10.80 cm/s  LV PW:     1.27 cm LV E/e' lateral: 9.8  LV IVS:    1.15 cm LV e' medial:  7.07 cm/s  LVOT diam:   2.20 cm LV E/e' medial: 15.0  LVOT Area:   3.80 cm     RIGHT VENTRICLE  RV S prime:   9.79 cm/s  TAPSE (M-mode): 2.3 cm   LEFT ATRIUM       Index    RIGHT ATRIUM     Index  LA diam:    3.60 cm 1.27 cm/m RA Area:   8.31 cm  LA Vol (A2C):  79.9 ml 28.16 ml/m RA Volume:  17.40 ml 6.13 ml/m  LA Vol (A4C):  62.8 ml 22.13 ml/m  LA Biplane Vol: 74.9 ml 26.40 ml/m    AORTA  Ao Root diam: 3.60 cm   MITRAL VALVE  MV Area (PHT): 2.62 cm   SHUNTS  MV Decel Time: 289 msec   Systemic Diam: 2.20 cm  MV E velocity: 106.00 cm/s  MV A velocity: 104.00 cm/s  MV E/A ratio: 1.02   Rozann Lesches MD  Electronically signed by Rozann Lesches MD  Signature Date/Time: 07/18/2020/4:41:30 PM     Treatments:   07/24/2020 Patient:  Joseph [name] Pre-Op Dx: 3 vessel CAD                         DM                         HTN                         Morbid obesity    Post-op Dx:  same Procedure: CABG X 4.  LIMA LAD, RSVG PDA, OM1, D1   Endoscopic greater saphenous vein harvest on the right Intra-operative Transesophageal Echocardiogram  Surgeon and Role:      * Lightfoot, Lucile Crater, MD - Primary    * Macarthur Critchley, PA-C - assisting  Anesthesia  general EBL:  500 ml Blood Administration: none Xclamp Time:  62 min Pump Time:  175mn  Drains: 19 F blake drain: L, mediastinal  Wires: none Counts: correct   Indications: This is a 49year old male with a history of coronary artery disease and poorly controlled diabetes who presents with left main/three-vessel coronary disease. On review of his  images he has acceptable targets in his circumflex and LAD. There is tandem lesions in in his right system with tight stenosis at the takeoff of his PDA. He also has 2 large diagonal vessels with moderate stenosis in each. We will review his echocardiogram his LV and RV function are preserved and there is no significant valvular disease. The risks and benefits of surgical revascularization were discussed with him in detail and he has agreed to proceed with this procedure. He will require vein mapping and also assessment of his left radial artery given his age. Given his body habitus and hemoglobin A1c of 9.4 he is not a candidate for bilateral mammary harvest. He also has had bilateral carpal tunnel release which may make radial harvest slightly challenging. He does have a history of Plavix use for previous PCI back in 2020, but I will check a P2 Y 12 to determine whether he will require a full Plavix washout. He tentatively will require a CABG 4with endoscopic harvesting of his left radial artery.  Findings: Poor signal in the left wrist.  Radial artery harvest was aborted.  Good LIMA, Good vein.  Heavily calcified RCA, and OM.  Good lumen.  The OM was intramyocardial.  Good flow on vein grafts   Discharge Exam: Blood pressure 97/77, pulse 84, temperature 98.1 F (36.7 C), temperature source Oral, resp. rate 14, height 6' 5" (1.956 m), weight (!) 171.1 kg, SpO2 100 %.   General appearance: alert, cooperative and no distress Heart: regular rate and rhythm, S1, S2 normal, no murmur, click, rub or gallop Lungs: clear to auscultation bilaterally Abdomen: soft, non-tender; bowel sounds normal; no masses,  no organomegaly Extremities: extremities normal, atraumatic, no cyanosis or edema Wound: clean and dry    Disposition: Discharge disposition: 01-Home or Self Care        Allergies as of 07/31/2020      Reactions   Sulfa Antibiotics Other (See Comments)   headache       Medication List    STOP taking these medications   amLODipine 2.5 MG tablet Commonly known as: NORVASC   clopidogrel 75 MG tablet Commonly known as: PLAVIX   doxycycline 100 MG tablet Commonly known as: VIBRA-TABS   hydrochlorothiazide 12.5 MG capsule Commonly known as: MICROZIDE   lisinopril 10 MG tablet Commonly known as: ZESTRIL   nitroGLYCERIN 0.4 MG SL tablet Commonly known as: NITROSTAT   promethazine 25 MG tablet Commonly known as: PHENERGAN   rivaroxaban 20 MG Tabs tablet Commonly known as: Xarelto     TAKE these medications   ALPRAZolam 1 MG tablet Commonly known as: XANAX Take 1-2 mg by mouth 4 (four) times daily as needed (anxiety).   aspirin 325 MG EC tablet Take 1 tablet (325 mg total) by mouth daily.   atorvastatin 80 MG tablet Commonly known as: LIPITOR Take 1 tablet (80 mg total) by mouth daily at 6 PM.   blood glucose meter kit and supplies Kit Dispense based on patient and insurance preference. Use up to four times daily as directed. (FOR ICD-9 250.00, 250.01).   buPROPion 150 MG 12 hr tablet Commonly known  as: WELLBUTRIN SR Take 150 mg by mouth 2 (two) times daily.   carvedilol 3.125 MG tablet Commonly known as: COREG Take 1 tablet (3.125 mg total) by mouth 2 (two) times daily. What changed:   medication strength  how much to take   diphenhydrAMINE 25 mg capsule Commonly known as: BENADRYL Take 50 mg by mouth every 6 (six) hours as needed for allergies.   escitalopram 10 MG tablet Commonly known as: LEXAPRO Take 10 mg by mouth daily.   fenofibrate 145 MG tablet Commonly known as: TRICOR Take 145 mg by mouth daily.   fluconazole 100 MG tablet Commonly known as: DIFLUCAN Take 100 mg by mouth every Monday.   fluticasone 50 MCG/ACT nasal spray Commonly known as: FLONASE Place 2 sprays into both nostrils daily as needed for allergies.   furosemide 40 MG tablet Commonly known as: LASIX Take 1 tablet (40 mg total) by mouth  daily.   HumaLOG KwikPen 100 UNIT/ML KwikPen Generic drug: insulin lispro Inject 15-40 Units into the skin 3 (three) times daily with meals. sliding scale   Lantus SoloStar 100 UNIT/ML Solostar Pen Generic drug: insulin glargine Inject 40 Units into the skin 2 (two) times daily. What changed: how much to take   meclizine 25 MG tablet Commonly known as: ANTIVERT Take 25-50 mg by mouth See admin instructions. Take 2 tablets (50 mg) by mouth in the morning & take 1 tablet (25 mg) by mouth at night.   metFORMIN 1000 MG tablet Commonly known as: GLUCOPHAGE Take 1 tablet (1,000 mg total) by mouth 2 (two) times daily with a meal.   midodrine 5 MG tablet Commonly known as: PROAMATINE Take 1 tablet (5 mg total) by mouth 3 (three) times daily with meals.   ofloxacin 0.3 % ophthalmic solution Commonly known as: OCUFLOX Place 2 drops into both eyes 2 (two) times daily as needed (infection).   oxyCODONE 15 MG immediate release tablet Commonly known as: ROXICODONE Take 15 mg by mouth 5 (five) times daily.   pantoprazole 40 MG tablet Commonly known as: PROTONIX Take 1 tablet (40 mg total) by mouth daily. What changed: how much to take   pregabalin 150 MG capsule Commonly known as: LYRICA Take 150 mg by mouth 3 (three) times daily.   ProAir HFA 108 (90 Base) MCG/ACT inhaler Generic drug: albuterol Inhale 2 puffs into the lungs every 6 (six) hours as needed for wheezing or shortness of breath.   Victoza 18 MG/3ML Sopn Generic drug: liraglutide Inject 1.2 mg into the skin at bedtime.       Follow-up Information    Deberah Pelton, NP Follow up on 08/14/2020.   Specialty: Cardiology Why: Please arrive 15 minutes early for your 9:15am post-hospital cardiology follow-up appointment Contact information: [place] [address] [phone]        [name], NP. Call in 1 day(s).   Contact information: [place]  [address] [phone]        Skeet Latch, MD .   Specialty: Cardiology Contact information: [address] [place] [address] [phone]        Lajuana Matte, MD Follow up.   Specialty: Cardiothoracic Surgery Why: Your follow-up appointment is on 8/20 at 11:00am. Please bring your hospital paperwork. Contact information: [address]  [place] [address] [phone]              The patient has been discharged on:  1.Beta Blocker:  Yes [   ]                              No   [ no  ]                              If No, reason:  2.Ace Inhibitor/ARB: Yes [   ]                                     No  [  no  ]                                     If No, reason: midodrine  3.Statin:   Yes [  yes ]                  No  [   ]                  If No, reason:  4.Ecasa:  Yes  [  yes ]                  No   [   ]                  If No, reason:     Signed: Elgie Collard 07/31/2020, 8:46 AM

## 2020-07-28 ENCOUNTER — Inpatient Hospital Stay (HOSPITAL_COMMUNITY): Payer: Medicare Other

## 2020-07-28 ENCOUNTER — Telehealth: Payer: Self-pay | Admitting: General Practice

## 2020-07-28 LAB — CBC
HCT: 23 % — ABNORMAL LOW (ref 39.0–52.0)
Hemoglobin: 7.2 g/dL — ABNORMAL LOW (ref 13.0–17.0)
MCH: 28.9 pg (ref 26.0–34.0)
MCHC: 31.3 g/dL (ref 30.0–36.0)
MCV: 92.4 fL (ref 80.0–100.0)
Platelets: 230 10*3/uL (ref 150–400)
RBC: 2.49 MIL/uL — ABNORMAL LOW (ref 4.22–5.81)
RDW: 15.4 % (ref 11.5–15.5)
WBC: 10.6 10*3/uL — ABNORMAL HIGH (ref 4.0–10.5)
nRBC: 1.2 % — ABNORMAL HIGH (ref 0.0–0.2)

## 2020-07-28 LAB — BASIC METABOLIC PANEL
Anion gap: 8 (ref 5–15)
BUN: 40 mg/dL — ABNORMAL HIGH (ref 6–20)
CO2: 24 mmol/L (ref 22–32)
Calcium: 8.7 mg/dL — ABNORMAL LOW (ref 8.9–10.3)
Chloride: 104 mmol/L (ref 98–111)
Creatinine, Ser: 1.77 mg/dL — ABNORMAL HIGH (ref 0.61–1.24)
GFR calc Af Amer: 52 mL/min — ABNORMAL LOW (ref >60)
GFR calc non Af Amer: 45 mL/min — ABNORMAL LOW (ref >60)
Glucose, Bld: 280 mg/dL — ABNORMAL HIGH (ref 70–99)
Potassium: 4.8 mmol/L (ref 3.5–5.1)
Sodium: 136 mmol/L (ref 135–145)

## 2020-07-28 LAB — GLUCOSE, CAPILLARY
Glucose-Capillary: 250 mg/dL — ABNORMAL HIGH (ref 70–99)
Glucose-Capillary: 253 mg/dL — ABNORMAL HIGH (ref 70–99)
Glucose-Capillary: 226 mg/dL — ABNORMAL HIGH (ref 70–99)
Glucose-Capillary: 213 mg/dL — ABNORMAL HIGH (ref 70–99)

## 2020-07-28 MED ORDER — SODIUM CHLORIDE 0.9 % IV SOLN: 250.0000 mL | INTRAVENOUS | Status: DC | PRN

## 2020-07-28 MED ORDER — ~~LOC~~ CARDIAC SURGERY, PATIENT & FAMILY EDUCATION: Freq: Once | Status: AC

## 2020-07-28 MED ORDER — ALBUTEROL SULFATE (2.5 MG/3ML) 0.083% IN NEBU: 2.5000 mg | INHALATION_SOLUTION | RESPIRATORY_TRACT | Status: DC | PRN

## 2020-07-28 MED ORDER — SODIUM CHLORIDE 0.9% FLUSH: 3.0000 mL | INTRAVENOUS | Status: DC | PRN

## 2020-07-28 MED ORDER — SODIUM CHLORIDE 0.9% FLUSH
3.0000 mL | Freq: Two times a day (BID) | INTRAVENOUS | Status: DC
  Administered 2020-07-28 – 2020-07-30 (×3): 3 mL via INTRAVENOUS

## 2020-07-28 NOTE — Progress Notes (Signed)
      [address]       [place] [address]             [phone]                 4 Days Post-Op Procedure(s) (LRB): CORONARY ARTERY BYPASS GRAFTING (CABG), ON PUMP, TIMES FOUR, USING LEFT INTERNAL MAMMARY ARTERY AND ENDOSCOPICALLY HARVESTED RIGHT GREATER SAPHENOUS VEIN (N/A) TRANSESOPHAGEAL ECHOCARDIOGRAM (TEE) (N/A)   Events: No events _______________________________________________________________ Vitals: BP 95/78   Pulse 86   Temp 99.6 F (37.6 C)   Resp 16   Ht 6\' 5"  (1.956 m)   Wt (!) 171.6 kg   SpO2 99%   BMI 44.85 kg/m   - Neuro: alert NAD  - Cardiovascular: Sinus  Drips: none.      - Pulm: EWOB    ABG    Component Value Date/Time   PHART 7.334 (L) 07/26/2020 0358   PCO2ART 41.1 07/26/2020 0358   PO2ART 67 (L) 07/26/2020 0358   HCO3 21.9 07/26/2020 0358   TCO2 23 07/26/2020 0358   ACIDBASEDEF 4.0 (H) 07/26/2020 0358   O2SAT 92.0 07/26/2020 0358    - Abd: soft - Extremity: 1+ edema  .Intake/Output      08/08 0701 - 08/09 0700 08/09 0701 - 08/10 0700   P.O.  600   I.V. (mL/kg)     Blood     IV Piggyback     Total Intake(mL/kg)  600 (3.5)   Urine (mL/kg/hr) 1525 (0.4)    Drains     Chest Tube 50    Total Output 1575    Net -1575 +600           _______________________________________________________________ Labs: CBC Latest Ref Rng & Units 07/28/2020 07/27/2020 07/26/2020  WBC 4.0 - 10.5 K/uL 10.6(H) 14.3(H) 15.4(H)  Hemoglobin 13.0 - 17.0 g/dL 7.2(L) 7.7(L) 7.1(L)  Hematocrit 39 - 52 % 23.0(L) 24.1(L) 22.6(L)  Platelets 150 - 400 K/uL 230 244 234   CMP Latest Ref Rng & Units 07/28/2020 07/27/2020 07/26/2020  Glucose 70 - 99 mg/dL 09/25/2020) 947(M) 546(T)  BUN 6 - 20 mg/dL 035(W) 65(K) 81(E)  Creatinine 0.61 - 1.24 mg/dL 75(T) 7.00(F) 7.49(S)  Sodium 135 - 145 mmol/L 136 138 137  Potassium 3.5 - 5.1 mmol/L 4.8 4.4 4.9  Chloride 98 - 111 mmol/L 104 104 104  CO2 22 - 32 mmol/L 24 23 23   Calcium 8.9 - 10.3 mg/dL 4.96(P) 8.9 8.9   Total Protein 6.5 - 8.1 g/dL - 6.0(L) 5.9(L)  Total Bilirubin 0.3 - 1.2 mg/dL - 0.5 0.7  Alkaline Phos 38 - 126 U/L - 39 61  AST 15 - 41 U/L - 43(H) 56(H)  ALT 0 - 44 U/L - 73(H) 76(H)    CXR: pending  _______________________________________________________________  Assessment and Plan: POD 4 s/p CABG  Neuro: pain controlled CV: on A/S/BB.  Will remove wound vac prior to discharge Pulm: on [place].  Continue pulm toilet Renal: creatinine trending down GI: on diet Heme: hgb stable ID: afebrile.  Wound care to left 4th toe Endo: increasing BG control Dispo: floor  , MD 07/28/2020 11:43 AM

## 2020-07-28 NOTE — Progress Notes (Signed)
Pt asked not to ambulate at this time due to sternal pain. Has ambulated x2 already today. Practiced IS, 1300 mL. Discussed sternal precautions, RW versus EVA, and walking again later today. Pt agreeable. Elevated his legs. Will f/u tomorrow. [id number] Ethelda Chick CES, ACSM 2:45 PM 07/28/2020

## 2020-07-28 NOTE — Telephone Encounter (Signed)
Pt is scheduled for a TOC hospital f/u on 08/14/20 at 9:15 AM with Edd Fabian per Judy Pimple.

## 2020-07-28 NOTE — Progress Notes (Signed)
Mobility Specialist - Progress Note   07/28/20 1600  Mobility  Activity Refused mobility   Pt refused mobility, stating that he has aggravated his hip pain due to walking earlier. He says he has walked 2x today and that he would be willing to ambulate later this evening or tomorrow.   Mamie Levers Mobility Specialist Mobility Specialist Phone: [phone]

## 2020-07-28 NOTE — Progress Notes (Signed)
Inpatient Diabetes Program Recommendations  AACE/ADA: New Consensus Statement on Inpatient Glycemic Control (2015)  Target Ranges:  Prepandial:   less than 140 mg/dL      Peak postprandial:   less than 180 mg/dL (1-2 hours)      Critically ill patients:  140 - 180 mg/dL   Results for DAVY, WESTMORELAND (MRN [id number]) as of 07/28/2020 07:59  Ref. Range 07/27/2020 06:45 07/27/2020 11:47 07/27/2020 16:00 07/27/2020 21:50  Glucose-Capillary Latest Ref Range: 70 - 99 mg/dL 941 (H)  8 units NOVOLOG  220 (H)  11 units NOVOLOG +  25 units LEVEMIR  246 (H)  11 units NOVOLOG  261 (H)  3 units NOVOLOG    Results for ZEN, CEDILLOS (MRN [id number]) as of 07/28/2020 07:59  Ref. Range 07/28/2020 06:31  Glucose-Capillary Latest Ref Range: 70 - 99 mg/dL 856 (H)  11 units NOVOLOG    Home DM Meds: Humalog 15-40 units TID       Lantus 60 units BID       Metformin 1000 mg BID       Victoza 1.2 mg Daily   Current Orders: Levemir 25 units Daily      Novolog Resistant Correction Scale/ SSI (0-20 units) TID AC + HS      Novolog 4 units TID with meals     MD- Note Levemir increased to 25 units Daily yesterday AM.  Elevated this AM again and also having issues with elevated afternoon CBGs as well.  Please consider:  1. Increase the Levemir to 35 units Daily  2. Increase the Novolog Meal Coverage to: Novolog 10 units TID with meals     --Will follow patient during hospitalization--  Ambrose Finland RN, MSN, CDE Diabetes Coordinator Inpatient Glycemic Control Team Team Pager: [phone] (8a-5p)

## 2020-07-28 NOTE — Telephone Encounter (Signed)
Patient currently admitted

## 2020-07-28 NOTE — Progress Notes (Signed)
Physical Therapy Treatment Patient Details Name: Joseph [name] MRN: [id number] DOB: [birth year] Today's Date: 07/28/2020    History of Present Illness Pt is a 49 y.o. male admitted 07/24/20 with hypotension and elevated troponins; found to have severe CAD. S/p CABG x 4 on 8/5. PMH includes morbid obesity, CAD, HTN, DM, PE, DVT, copd, depression, bil rotator cuff repair, arthritis.   PT Comments    Pt progressing well with mobility, improving cognition and activity tolerance. Today's session focused on transfer strategies while maintaining sternal precautions, including standing from low recliner height without UE support. Pt able to perform hallway ambulation with eva walker and min guard. Discussed initiating gait training with RW next session, pt in agreement. VSS on 2L O2 [place]    Follow Up Recommendations  Home health PT;Supervision/Assistance - 24 hour     Equipment Recommendations  Rolling walker with 5" wheels    Recommendations for Other Services       Precautions / Restrictions Precautions Precautions: Sternal;Fall Precaution Comments: Reviewed sternal precautions    Mobility  Bed Mobility               General bed mobility comments: Received sitting in recliner  Transfers Overall transfer level: Needs assistance Equipment used: 4-wheeled walker (eva walker) Transfers: Sit to/from Stand Sit to Stand: Min guard         General transfer comment: Reliant on momentum, able to power into standing without UE support; poor eccentric control sitting into low recliner height  Ambulation/Gait Ambulation/Gait assistance: Min guard Gait Distance (Feet): 280 Feet Assistive device: 4-wheeled walker (eva walker) Gait Pattern/deviations: Step-through pattern;Decreased stride length;Trunk flexed   Gait velocity interpretation: 1.31 - 2.62 ft/sec, indicative of limited community ambulator General Gait Details: Slow, mostly steady gait with eva walker and min guard for  balance; cues for upright posture, pt reports difficulty with this secondary to chronic R hip pain. Discussed trial with RW next session   Stairs             Wheelchair Mobility    Modified Rankin (Stroke Patients Only)       Balance Overall balance assessment: Needs assistance Sitting-balance support: No upper extremity supported;Feet supported Sitting balance-Leahy Scale: Fair       Standing balance-Leahy Scale: Fair Standing balance comment: Can static stand without UE support                            Cognition Arousal/Alertness: Awake/alert Behavior During Therapy: WFL for tasks assessed/performed Overall Cognitive Status: Within Functional Limits for tasks assessed                                 General Comments: WFL for simple tasks this session; not formally assessed      Exercises      General Comments General comments (skin integrity, edema, etc.): SpO2 >/90% on 2L O2 [place]. Post-ambulation BP 133/84, HR 86-105      Pertinent Vitals/Pain Pain Assessment: Faces Faces Pain Scale: Hurts a little bit Pain Location: R hip with mobility Pain Descriptors / Indicators: Guarding;Sore Pain Intervention(s): Monitored during session;Limited activity within patient's tolerance    Home Living                      Prior Function            PT Goals (current goals can now  be found in the care plan section) Progress towards PT goals: Progressing toward goals    Frequency    Min 3X/week      PT Plan Current plan remains appropriate    Co-evaluation              AM-PAC PT "6 Clicks" Mobility   Outcome Measure  Help needed turning from your back to your side while in a flat bed without using bedrails?: A Little Help needed moving from lying on your back to sitting on the side of a flat bed without using bedrails?: A Little Help needed moving to and from a bed to a chair (including a wheelchair)?: A Little Help  needed standing up from a chair using your arms (e.g., wheelchair or bedside chair)?: A Little Help needed to walk in hospital room?: A Little Help needed climbing 3-5 steps with a railing? : A Lot 6 Click Score: 17    End of Session Equipment Utilized During Treatment: Oxygen Activity Tolerance: Patient tolerated treatment well Patient left: in chair;with call bell/phone within reach Nurse Communication: Mobility status PT Visit Diagnosis: Other abnormalities of gait and mobility (R26.89);Muscle weakness (generalized) (M62.81)     Time: 2863-8177 PT Time Calculation (min) (ACUTE ONLY): 24 min  Charges:  $Therapeutic Exercise: 8-22 mins $Therapeutic Activity: 8-22 mins                     Ina Homes, PT, DPT Acute Rehabilitation Services  Pager [phone] Office [phone]  Malachy Chamber 07/28/2020, 12:58 PM

## 2020-07-29 LAB — CBC
HCT: 24.6 % — ABNORMAL LOW (ref 39.0–52.0)
Hemoglobin: 7.6 g/dL — ABNORMAL LOW (ref 13.0–17.0)
MCH: 29 pg (ref 26.0–34.0)
MCHC: 30.9 g/dL (ref 30.0–36.0)
MCV: 93.9 fL (ref 80.0–100.0)
Platelets: 303 10*3/uL (ref 150–400)
RBC: 2.62 MIL/uL — ABNORMAL LOW (ref 4.22–5.81)
RDW: 15.5 % (ref 11.5–15.5)
WBC: 12.4 10*3/uL — ABNORMAL HIGH (ref 4.0–10.5)
nRBC: 0.7 % — ABNORMAL HIGH (ref 0.0–0.2)

## 2020-07-29 LAB — BASIC METABOLIC PANEL
Anion gap: 12 (ref 5–15)
BUN: 37 mg/dL — ABNORMAL HIGH (ref 6–20)
CO2: 22 mmol/L (ref 22–32)
Calcium: 9.2 mg/dL (ref 8.9–10.3)
Chloride: 103 mmol/L (ref 98–111)
Creatinine, Ser: 1.4 mg/dL — ABNORMAL HIGH (ref 0.61–1.24)
GFR calc Af Amer: >60 mL/min (ref >60)
GFR calc non Af Amer: 59 mL/min — ABNORMAL LOW (ref >60)
Glucose, Bld: 220 mg/dL — ABNORMAL HIGH (ref 70–99)
Potassium: 4.9 mmol/L (ref 3.5–5.1)
Sodium: 137 mmol/L (ref 135–145)

## 2020-07-29 LAB — TYPE AND SCREEN
ABO/RH(D): O POS
Antibody Screen: NEGATIVE
Unit division: 0
Unit division: 0
Unit division: 0
Unit division: 0

## 2020-07-29 LAB — BPAM RBC
Blood Product Expiration Date: 202108312359
Blood Product Expiration Date: 202108312359
Blood Product Expiration Date: 202109032359
Blood Product Expiration Date: 202109032359
ISSUE DATE / TIME: 202108051939
ISSUE DATE / TIME: 202108060506
ISSUE DATE / TIME: 202108072250
ISSUE DATE / TIME: 202108072328
Unit Type and Rh: 5100
Unit Type and Rh: 5100
Unit Type and Rh: 5100
Unit Type and Rh: 5100

## 2020-07-29 LAB — GLUCOSE, CAPILLARY
Glucose-Capillary: 219 mg/dL — ABNORMAL HIGH (ref 70–99)
Glucose-Capillary: 225 mg/dL — ABNORMAL HIGH (ref 70–99)
Glucose-Capillary: 240 mg/dL — ABNORMAL HIGH (ref 70–99)
Glucose-Capillary: 211 mg/dL — ABNORMAL HIGH (ref 70–99)

## 2020-07-29 MED ORDER — POTASSIUM CHLORIDE CRYS ER 20 MEQ PO TBCR
40.0000 meq | EXTENDED_RELEASE_TABLET | Freq: Once | ORAL | Status: AC
  Administered 2020-07-29: 40 meq via ORAL
  Filled 2020-07-29: qty 2

## 2020-07-29 MED ORDER — FUROSEMIDE 40 MG PO TABS
40.0000 mg | ORAL_TABLET | Freq: Every day | ORAL | Status: DC
  Administered 2020-07-29 – 2020-07-31 (×3): 40 mg via ORAL
  Filled 2020-07-29 (×3): qty 1

## 2020-07-29 MED FILL — Mannitol IV Soln 20%: INTRAVENOUS | Qty: 500 | Status: AC

## 2020-07-29 MED FILL — Heparin Sodium (Porcine) Inj 1000 Unit/ML: INTRAMUSCULAR | Qty: 10 | Status: AC

## 2020-07-29 MED FILL — Sodium Bicarbonate IV Soln 8.4%: INTRAVENOUS | Qty: 50 | Status: AC

## 2020-07-29 MED FILL — Calcium Chloride Inj 10%: INTRAVENOUS | Qty: 10 | Status: AC

## 2020-07-29 MED FILL — Electrolyte-R (PH 7.4) Solution: INTRAVENOUS | Qty: 5000 | Status: AC

## 2020-07-29 MED FILL — Heparin Sodium (Porcine) Inj 1000 Unit/ML: INTRAMUSCULAR | Qty: 30 | Status: AC

## 2020-07-29 MED FILL — Sodium Chloride IV Soln 0.9%: INTRAVENOUS | Qty: 5000 | Status: AC

## 2020-07-29 NOTE — Progress Notes (Signed)
Inpatient Diabetes Program Recommendations  AACE/ADA: New Consensus Statement on Inpatient Glycemic Control (2015)  Target Ranges:  Prepandial:   less than 140 mg/dL      Peak postprandial:   less than 180 mg/dL (1-2 hours)      Critically ill patients:  140 - 180 mg/dL   Results for Joseph [name], Joseph [name] (MRN [id number]) as of 07/28/2020 07:59  Ref. Range 07/27/2020 06:45 07/27/2020 11:47 07/27/2020 16:00 07/27/2020 21:50  Glucose-Capillary Latest Ref Range: 70 - 99 mg/dL 638 (H)  8 units NOVOLOG  220 (H)  11 units NOVOLOG +  25 units LEVEMIR  246 (H)  11 units NOVOLOG  261 (H)  3 units NOVOLOG    Results for Joseph [name], Joseph [name] (MRN [id number]) as of 07/28/2020 07:59  Ref. Range 07/28/2020 06:31  Glucose-Capillary Latest Ref Range: 70 - 99 mg/dL 811 (H)  11 units NOVOLOG    Home DM Meds: Humalog 15-40 units TID       Lantus 60 units BID       Metformin 1000 mg BID       Victoza 1.2 mg Daily   Current Orders: Levemir 25 units Daily      Novolog Resistant Correction Scale/ SSI (0-20 units) TID AC + HS      Novolog 4 units TID with meals    Please consider:  1. Increase the Levemir to 35 units Daily  2. Increase the Novolog Meal Coverage to: Novolog 10 units TID with meals   --Will follow patient during hospitalization--  Christena Deem RN, MSN, BC-ADM Inpatient Diabetes Coordinator Team Pager [phone] (8a-5p)

## 2020-07-29 NOTE — Progress Notes (Signed)
CARDIAC REHAB PHASE I   PRE:  Rate/Rhythm: 85 SR    BP: sitting 108/87    SaO2: 97 RA  MODE:  Ambulation: 520 ft   POST:  Rate/Rhythm: 120 ST    BP: sitting 124/88     SaO2: 96 RA  Pt stood independently with raised surface. Ambulated with EVA, standby assist. Normal pace, long distance. Stated hip and chest starting to hurt toward end. To recliner. Making good progress. Encouraged x2 more walks today. He is open to using RW next walk. Encouraged leg elevation.  [phone] Joseph [name] CES, [place] 07/29/2020 11:52 AM

## 2020-07-29 NOTE — Telephone Encounter (Signed)
Patient is currently admitted

## 2020-07-29 NOTE — Progress Notes (Signed)
      [address]       [place] [address]             [phone]                 5 Days Post-Op Procedure(s) (LRB): CORONARY ARTERY BYPASS GRAFTING (CABG), ON PUMP, TIMES FOUR, USING LEFT INTERNAL MAMMARY ARTERY AND ENDOSCOPICALLY HARVESTED RIGHT GREATER SAPHENOUS VEIN (N/A) TRANSESOPHAGEAL ECHOCARDIOGRAM (TEE) (N/A)   Events: No events _______________________________________________________________ Vitals: BP 101/87 (BP Location: Right Arm)   Pulse 88   Temp 98 F (36.7 C) (Oral)   Resp 19   Ht 6\' 5"  (1.956 m)   Wt (!) 172 kg   SpO2 96%   BMI 44.97 kg/m   - Neuro: alert NAD  - Cardiovascular: Sinus  Drips: none.      - Pulm: EWOB    ABG    Component Value Date/Time   PHART 7.334 (L) 07/26/2020 0358   PCO2ART 41.1 07/26/2020 0358   PO2ART 67 (L) 07/26/2020 0358   HCO3 21.9 07/26/2020 0358   TCO2 23 07/26/2020 0358   ACIDBASEDEF 4.0 (H) 07/26/2020 0358   O2SAT 92.0 07/26/2020 0358    - Abd: soft - Extremity: 1+ edema  .Intake/Output      08/09 0701 - 08/10 0700 08/10 0701 - 08/11 0700   P.O. 1080 320   I.V. (mL/kg) 0 (0)    Total Intake(mL/kg) 1080 (6.3) 320 (1.9)   Urine (mL/kg/hr) 500 (0.1) 700 (0.4)   Chest Tube     Total Output 500 700   Net +580 -380        Urine Occurrence 1 x    Stool Occurrence 1 x       _______________________________________________________________ Labs: CBC Latest Ref Rng & Units 07/29/2020 07/28/2020 07/27/2020  WBC 4.0 - 10.5 K/uL 12.4(H) 10.6(H) 14.3(H)  Hemoglobin 13.0 - 17.0 g/dL 7.6(L) 7.2(L) 7.7(L)  Hematocrit 39 - 52 % 24.6(L) 23.0(L) 24.1(L)  Platelets 150 - 400 K/uL 303 230 244   CMP Latest Ref Rng & Units 07/29/2020 07/28/2020 07/27/2020  Glucose 70 - 99 mg/dL 09/26/2020) 710(G) 269(S)  BUN 6 - 20 mg/dL 854(O) 27(O) 35(K)  Creatinine 0.61 - 1.24 mg/dL 09(F) 8.18(E) 9.93(Z)  Sodium 135 - 145 mmol/L 137 136 138  Potassium 3.5 - 5.1 mmol/L 4.9 4.8 4.4  Chloride 98 - 111 mmol/L 103 104 104  CO2  22 - 32 mmol/L 22 24 23   Calcium 8.9 - 10.3 mg/dL 9.2 1.69(C) 8.9  Total Protein 6.5 - 8.1 g/dL - - 6.0(L)  Total Bilirubin 0.3 - 1.2 mg/dL - - 0.5  Alkaline Phos 38 - 126 U/L - - 39  AST 15 - 41 U/L - - 43(H)  ALT 0 - 44 U/L - - 73(H)    CXR: pending  _______________________________________________________________  Assessment and Plan: POD 5 s/p CABG  Doing well period Diuresing today Dispo planning   , MD 07/29/2020 5:41 PM

## 2020-07-29 NOTE — Progress Notes (Signed)
Mobility Specialist - Progress Note   07/29/20 1349  Mobility  Activity Transferred:  Chair to bed  Level of Assistance Standby assist, set-up cues, supervision of patient - no hands on  Assistive Device None  Distance Ambulated (ft) 15 ft  Mobility Response Tolerated fair  Mobility performed by Mobility specialist  $Mobility charge 1 Mobility   Pt declined mobility due to hip pain from walking earlier, however, he asked me to assist him w/ getting into bed. He said he wanted to try walking w/o the RW and just wanted supervision; he was able to walk around the foot of the bed to the other side and lay down on his own. Pt c/o feeling out of breath, his SpO2 was 87% w/ a HR of 98. His SpO2 rose to >90% w/ pursed lip breathing.   Mamie Levers Mobility Specialist Mobility Specialist Phone: [phone]

## 2020-07-29 NOTE — Progress Notes (Addendum)
Patient with two incision/ puncture sites to right lower leg with dressings, dressings below incision site. Stain marked, lower one near foot draining. Both dressings changed at this time.  Will monitor. Domani Bakos, Randall An RN

## 2020-07-29 NOTE — Progress Notes (Addendum)
Right leg with two incisions/ puncture sites below incision , upper one on calf site stain marked changed. Lower site near ankle oozing dressing changed. Will monitor. Keath Matera, Randall An RN

## 2020-07-30 LAB — GLUCOSE, CAPILLARY
Glucose-Capillary: 219 mg/dL — ABNORMAL HIGH (ref 70–99)
Glucose-Capillary: 210 mg/dL — ABNORMAL HIGH (ref 70–99)
Glucose-Capillary: 141 mg/dL — ABNORMAL HIGH (ref 70–99)
Glucose-Capillary: 211 mg/dL — ABNORMAL HIGH (ref 70–99)

## 2020-07-30 MED ORDER — OXYCODONE HCL 5 MG PO TABS
15.0000 mg | ORAL_TABLET | Freq: Four times a day (QID) | ORAL | Status: DC | PRN
  Administered 2020-07-30 – 2020-07-31 (×4): 15 mg via ORAL
  Filled 2020-07-30 (×4): qty 3

## 2020-07-30 MED ORDER — OXYCODONE HCL 5 MG PO TABS
5.0000 mg | ORAL_TABLET | Freq: Once | ORAL | Status: AC
  Administered 2020-07-30: 5 mg via ORAL
  Filled 2020-07-30: qty 1

## 2020-07-30 MED ORDER — INSULIN DETEMIR 100 UNIT/ML ~~LOC~~ SOLN
28.0000 [IU] | Freq: Every day | SUBCUTANEOUS | Status: DC
  Administered 2020-07-30 – 2020-07-31 (×3): 28 [IU] via SUBCUTANEOUS
  Filled 2020-07-30 (×2): qty 0.28

## 2020-07-30 NOTE — Progress Notes (Signed)
Physical Therapy Treatment Patient Details Name: Joseph [name] MRN: [id number] DOB: 06/03/1971 Today's Date: 07/30/2020    History of Present Illness Pt is a 49 y.o. male admitted 07/24/20 with hypotension and elevated troponins; found to have severe CAD. S/p CABG x 4 on 8/5. PMH includes morbid obesity, CAD, HTN, DM, PE, DVT, copd, depression, bil rotator cuff repair, arthritis.    PT Comments    Patient progressing with independence with mobility, but limited distance due to hip pain and sternal wound tightness from dressing change.  He will continue to benefit from skilled PT in the acute setting prior to d/c home with family support.   Follow Up Recommendations  Home health PT;Supervision/Assistance - 24 hour     Equipment Recommendations  Rolling walker with 5" wheels (bariatric)    Recommendations for Other Services       Precautions / Restrictions Precautions Precautions: Sternal;Fall;Other (comment) Precaution Comments: wound vac on chest Restrictions Weight Bearing Restrictions: Yes    Mobility  Bed Mobility Overal bed mobility: Modified Independent             General bed mobility comments: able to mobilize up to EOB unaided  Transfers   Equipment used: None Transfers: Sit to/from Stand Sit to Stand: Supervision         General transfer comment: up in room on his own at times, S for safety during session  Ambulation/Gait Ambulation/Gait assistance: Supervision Gait Distance (Feet): 200 Feet Assistive device: Rolling walker (2 wheeled) Gait Pattern/deviations: Trunk flexed     General Gait Details: encouraged proximity to walker and tall posture, but notes tightness of wound vac dressing   Stairs             Wheelchair Mobility    Modified Rankin (Stroke Patients Only)       Balance Overall balance assessment: Needs assistance   Sitting balance-Leahy Scale: Good       Standing balance-Leahy Scale: Fair                               Cognition Arousal/Alertness: Awake/alert Behavior During Therapy: WFL for tasks assessed/performed Overall Cognitive Status: Within Functional Limits for tasks assessed                                        Exercises      General Comments General comments (skin integrity, edema, etc.): on RA VSS with activity      Pertinent Vitals/Pain Pain Score: 5  Pain Location: R hip with mobility Pain Descriptors / Indicators: Guarding;Sore Pain Intervention(s): Monitored during session;Repositioned    Home Living                      Prior Function            PT Goals (current goals can now be found in the care plan section) Progress towards PT goals: Progressing toward goals    Frequency    Min 3X/week      PT Plan Current plan remains appropriate    Co-evaluation              AM-PAC PT "6 Clicks" Mobility   Outcome Measure  Help needed turning from your back to your side while in a flat bed without using bedrails?: None Help needed moving from lying on your  back to sitting on the side of a flat bed without using bedrails?: None Help needed moving to and from a bed to a chair (including a wheelchair)?: A Little Help needed standing up from a chair using your arms (e.g., wheelchair or bedside chair)?: None Help needed to walk in hospital room?: A Little Help needed climbing 3-5 steps with a railing? : A Little 6 Click Score: 21    End of Session   Activity Tolerance: Patient tolerated treatment well Patient left: in bed;with call bell/phone within reach (seated EOB)   PT Visit Diagnosis: Other abnormalities of gait and mobility (R26.89);Muscle weakness (generalized) (M62.81)     Time: 5498-2641 PT Time Calculation (min) (ACUTE ONLY): 17 min  Charges:  $Gait Training: 8-22 mins                     Sheran Lawless, PT Acute Rehabilitation Services Pager:[phone] Office:[phone] 07/30/2020    Joseph [name] 07/30/2020, 5:13 PM

## 2020-07-30 NOTE — Progress Notes (Addendum)
      [address]       [place] [address]             [phone]      6 Days Post-Op Procedure(s) (LRB): CORONARY ARTERY BYPASS GRAFTING (CABG), ON PUMP, TIMES FOUR, USING LEFT INTERNAL MAMMARY ARTERY AND ENDOSCOPICALLY HARVESTED RIGHT GREATER SAPHENOUS VEIN (N/A) TRANSESOPHAGEAL ECHOCARDIOGRAM (TEE) (N/A) Subjective: Feels okay this morning. He wants to be put back on his home dose of pain medicine.   Objective: Vital signs in last 24 hours: Temp:  [98 F (36.7 C)-98.6 F (37 C)] 98.4 F (36.9 C) (08/11 0744) Pulse Rate:  [78-88] 84 (08/11 0744) Cardiac Rhythm: Normal sinus rhythm (08/10 1936) Resp:  [12-19] 12 (08/11 0744) BP: (91-117)/(69-87) 107/78 (08/11 0744) SpO2:  [95 %-100 %] 97 % (08/11 0744) Weight:  [171.4 kg] 171.4 kg (08/11 0548)     Intake/Output from previous day: 08/10 0701 - 08/11 0700 In: 1283 [P.O.:1280; I.V.:3] Out: 2200 [Urine:2200] Intake/Output this shift: No intake/output data recorded.  General appearance: alert, cooperative and no distress Heart: regular rate and rhythm, S1, S2 normal, no murmur, click, rub or gallop Lungs: clear to auscultation bilaterally Abdomen: soft, non-tender; bowel sounds normal; no masses,  no organomegaly Extremities: 1-2+ pitting edema in bilateral lower ext Wound: clean and dry dressed with a prevena, right leg EVH site with some drainage  Lab Results: Recent Labs    07/28/20 0115 07/29/20 0129  WBC 10.6* 12.4*  HGB 7.2* 7.6*  HCT 23.0* 24.6*  PLT 230 303   BMET:  Recent Labs    07/28/20 0115 07/29/20 0129  NA 136 137  K 4.8 4.9  CL 104 103  CO2 24 22  GLUCOSE 280* 220*  BUN 40* 37*  CREATININE 1.77* 1.40*  CALCIUM 8.7* 9.2    PT/INR: No results for input(s): LABPROT, INR in the last 72 hours. ABG    Component Value Date/Time   PHART 7.334 (L) 07/26/2020 0358   HCO3 21.9 07/26/2020 0358   TCO2 23 07/26/2020 0358   ACIDBASEDEF 4.0 (H) 07/26/2020 0358   O2SAT 92.0  07/26/2020 0358   CBG (last 3)  Recent Labs    07/29/20 1637 07/29/20 2156 07/30/20 0624  GLUCAP 240* 211* 219*    Assessment/Plan: S/P Procedure(s) (LRB): CORONARY ARTERY BYPASS GRAFTING (CABG), ON PUMP, TIMES FOUR, USING LEFT INTERNAL MAMMARY ARTERY AND ENDOSCOPICALLY HARVESTED RIGHT GREATER SAPHENOUS VEIN (N/A) TRANSESOPHAGEAL ECHOCARDIOGRAM (TEE) (N/A)  1. CV-NSR in the 80s, BP well controlled. Continue asa, statin, bb, and midodrine. 2. Pulm-tolerating room air with good oxygen support.  3. Renal-creatinine 1.40, electrolytes okay. Continue lasix 4. H and H 7.6/24.6, expected acute blood loss anemia 5. Endo-blood glucose poorly controlled. Levemir increased, also on meal coverage and SSI.  6. On lovenox for VT prophylaxis  Plan: He was on oxycodone 15mg  5x daily at home and asking to be switched to his home dose. He is otherwise recovering well. Blood glucose uncontrolled-will increase Levemir.      LOS: 11 days    07/30/2020   Agree with above. We will continue diuresis Dispo planning likely tomorrow.  Mintie Witherington 09/29/2020

## 2020-07-30 NOTE — Progress Notes (Signed)
Pt up ambulating with PT. Will f/u tomorrow. Ethelda Chick CES, ACSM 2:12 PM 07/30/2020

## 2020-07-30 NOTE — Telephone Encounter (Signed)
Patient still admitted.

## 2020-07-30 NOTE — Progress Notes (Signed)
Occupational Therapy Treatment Patient Details Name: Joseph [name] MRN: [id number] DOB: Apr 18, 1971 Today's Date: 07/30/2020    History of present illness Pt is a 49 y.o. male admitted 07/24/20 with hypotension and elevated troponins; found to have severe CAD. S/p CABG x 4 on 8/5. PMH includes morbid obesity, CAD, HTN, DM, PE, DVT, copd, depression, bil rotator cuff repair, arthritis.   OT comments  Pt presents supine in bed, agreeable to EOB activity as pt reports fatigue from recent walk in hallway. Focus of session on continued review/reinforcement of sternal precautions within functional context. Educated pt in use of AE for increasing safety and independence while maintaining precautions. Pt verbalizing and return demonstrating understanding throughout. Educated in resources for obtaining AE. Pt does require min cues for carryover of precautions during functional tasks. He appears to have good family support at time of discharge. Will continue per POC at this time.   Follow Up Recommendations  Home health OT;Supervision/Assistance - 24 hour    Equipment Recommendations  Tub/shower bench          Precautions / Restrictions Precautions Precautions: Sternal;Fall;Other (comment) Precaution Comments: wound vac on chest Restrictions Weight Bearing Restrictions: Yes       Mobility Bed Mobility Overal bed mobility: Modified Independent             General bed mobility comments: able to mobilize up to EOB unaided  Transfers   Equipment used: None Transfers: Sit to/from Stand Sit to Stand: Supervision         General transfer comment: up in room on his own at times, S for safety during session    Balance Overall balance assessment: Needs assistance   Sitting balance-Leahy Scale: Good       Standing balance-Leahy Scale: Fair                             ADL either performed or assessed with clinical judgement   ADL Overall ADL's : Needs  assistance/impaired                       Lower Body Dressing Details (indicate cue type and reason): educated in use of sock aide and reacher for LB ADL, pt return demonstrating use of sock aide with min cues        Toileting - Clothing Manipulation Details (indicate cue type and reason): educated in safe methods for pericare, educated pt in option of AE       General ADL Comments: pt declined mobility beyond EOB as he recently walked with PT and fatigued, focus of sesion on AE education while maintaining sternal precautiosn      Vision       Perception     Praxis      Cognition Arousal/Alertness: Awake/alert Behavior During Therapy: WFL for tasks assessed/performed Overall Cognitive Status: Within Functional Limits for tasks assessed                                          Exercises     Shoulder Instructions       General Comments VSS    Pertinent Vitals/ Pain       Pain Assessment: Faces Pain Score: 5  Faces Pain Scale: Hurts a little bit Pain Location: R hip with mobility Pain Descriptors / Indicators: Guarding;Sore Pain Intervention(s): Monitored  during session;Repositioned  Home Living                                          Prior Functioning/Environment              Frequency  Min 2X/week        Progress Toward Goals  OT Goals(current goals can now be found in the care plan section)  Progress towards OT goals: Progressing toward goals  Acute Rehab OT Goals Patient Stated Goal: be able to be independent again OT Goal Formulation: With patient Time For Goal Achievement: 08/10/20 Potential to Achieve Goals: Good ADL Goals Pt Will Perform Upper Body Bathing: with min assist;sitting Pt Will Perform Lower Body Bathing: with min assist;sit to/from stand;sitting/lateral leans;with adaptive equipment Pt Will Perform Upper Body Dressing: with min assist;sitting Pt Will Perform Lower Body Dressing:  with min assist;sitting/lateral leans;sit to/from stand;with adaptive equipment Pt Will Transfer to Toilet: with min guard assist;ambulating;regular height toilet Additional ADL Goal #1: Pt will implement sternal precautions to all BADLs with min VC's Additional ADL Goal #2: Pt will recall and apply 3-5 ECS strategies to apply to typical BADL routine  Plan Discharge plan remains appropriate    Co-evaluation                 AM-PAC OT "6 Clicks" Daily Activity     Outcome Measure   Help from another person eating meals?: A Little Help from another person taking care of personal grooming?: A Little Help from another person toileting, which includes using toliet, bedpan, or urinal?: A Lot Help from another person bathing (including washing, rinsing, drying)?: A Lot Help from another person to put on and taking off regular upper body clothing?: A Lot Help from another person to put on and taking off regular lower body clothing?: A Lot 6 Click Score: 14    End of Session    OT Visit Diagnosis: Unsteadiness on feet (R26.81);Other abnormalities of gait and mobility (R26.89);Muscle weakness (generalized) (M62.81);Pain Pain - Right/Left: Right Pain - part of body: Hip   Activity Tolerance Patient tolerated treatment well;Patient limited by fatigue   Patient Left in bed;with call bell/phone within reach   Nurse Communication Mobility status        Time: 1535-1601 OT Time Calculation (min): 26 min  Charges: OT General Charges $OT Visit: 1 Visit OT Treatments $Self Care/Home Management : 23-37 mins  Marcy Siren, OT Acute Rehabilitation Services Pager [phone] Office [phone]    Joseph [name] 07/30/2020, 5:18 PM

## 2020-07-30 NOTE — Progress Notes (Signed)
Mobility Specialist - Progress Note   07/30/20 1740  Mobility  Activity Ambulated in hall  Level of Assistance Standby assist, set-up cues, supervision of patient - no hands on  Assistive Device None  Distance Ambulated (ft) 110 ft  Mobility Response Tolerated well  Mobility performed by Mobility specialist  $Mobility charge 1 Mobility    Pre-mobility: 77 HR During mobility: 84 HR Post-mobility: 87 HR  Pt c/o midsternal and hip pain which he rated a 6/10.   Mamie Levers Mobility Specialist Mobility Specialist Phone: [phone]

## 2020-07-31 LAB — GLUCOSE, CAPILLARY
Glucose-Capillary: 213 mg/dL — ABNORMAL HIGH (ref 70–99)
Glucose-Capillary: 213 mg/dL — ABNORMAL HIGH (ref 70–99)

## 2020-07-31 MED ORDER — ASPIRIN 325 MG PO TBEC: 325.0000 mg | DELAYED_RELEASE_TABLET | Freq: Every day | ORAL | 0 refills | Status: DC

## 2020-07-31 MED ORDER — FUROSEMIDE 40 MG PO TABS: 40.0000 mg | ORAL_TABLET | Freq: Every day | ORAL | 0 refills | Status: DC

## 2020-07-31 MED ORDER — LANTUS SOLOSTAR 100 UNIT/ML ~~LOC~~ SOPN: 40.0000 [IU] | PEN_INJECTOR | Freq: Two times a day (BID) | SUBCUTANEOUS | 11 refills | Status: DC

## 2020-07-31 MED ORDER — CARVEDILOL 3.125 MG PO TABS: 3.1250 mg | ORAL_TABLET | Freq: Two times a day (BID) | ORAL | 1 refills | Status: DC

## 2020-07-31 MED ORDER — MIDODRINE HCL 5 MG PO TABS: 5.0000 mg | ORAL_TABLET | Freq: Three times a day (TID) | ORAL | 1 refills | Status: DC

## 2020-07-31 MED FILL — ASPIRIN EC 325 MG TABLET: 325 | 30 days supply | Qty: 30 | Fill #0

## 2020-07-31 MED FILL — CARVEDILOL 3.125 MG TABLET: 3.125 | 30 days supply | Qty: 60 | Fill #0

## 2020-07-31 MED FILL — FUROSEMIDE 40 MG TABLET: 40 | 5 days supply | Qty: 5 | Fill #0

## 2020-07-31 MED FILL — LANTUS SOLOSTAR 100 UNITS/M: 100 | 19 days supply | Qty: 15 | Fill #0

## 2020-07-31 MED FILL — MIDODRINE HCL 5 MG TABS: 5 | 30 days supply | Qty: 90 | Fill #0

## 2020-07-31 NOTE — Telephone Encounter (Signed)
Current admit 

## 2020-07-31 NOTE — Progress Notes (Signed)
Discussed IS, sternal precautions, smoking cessation, diet, exercise, and CRPII. Pt voiced understanding. He is motivated to quit smoking. Discussed low sodium, DM diet. Encouraged exercise as tolerated, he is not interested in CRPII but will refer to [facility]. Pt would like bariatric RW if can be delivered soon. [phone] Ethelda Chick CES, ACSM 11:26 AM 07/31/2020

## 2020-07-31 NOTE — Care Management Important Message (Signed)
Important Message  Patient Details  Name: Joseph [name] MRN: [id number] Date of Birth: April 12, 1971   Medicare Important Message Given:  Yes     Renie Ora 07/31/2020, 9:06 AM

## 2020-07-31 NOTE — Progress Notes (Signed)
Occupational Therapy Treatment Patient Details Name: Joseph [name] MRN: [id number] DOB: December 11, 1971 Today's Date: 07/31/2020    History of present illness Pt is a 49 y.o. male admitted 07/24/20 with hypotension and elevated troponins; found to have severe CAD. S/p CABG x 4 on 8/5. PMH includes morbid obesity, CAD, HTN, DM, PE, DVT, copd, depression, bil rotator cuff repair, arthritis.   OT comments  OT treatment session with focus on self-care re-education with adherence to sternal precautions in prep for safe d/c home this date. Patient able to recall 3/3 sternal precautions with little cueing. OT provided education on energy conservation strategies including placement of BADL items in bathroom and small meal prep items in kitchen. 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 Education provided on slow return to normal activity with return to leisure and work activities in the coming months. Patient expressed verbal understanding. OT note frequent urination 2/2 new medication. OT provided urinal for energy conservation. Patient would benefit from [facility] to maximize safety and independence with self-care tasks in prep for return to PLOF.     Follow Up Recommendations  Home health OT;Supervision/Assistance - 24 hour    Equipment Recommendations  Tub/shower bench    Recommendations for Other Services      Precautions / Restrictions Precautions Precautions: Sternal;Fall       Mobility Bed Mobility Overal bed mobility: Modified Independent                Transfers Overall transfer level: Needs assistance Equipment used: Rolling walker (2 wheeled) Transfers: Sit to/from Stand Sit to Stand: Supervision              Balance     Sitting balance-Leahy Scale: Good       Standing balance-Leahy Scale: Fair                             ADL either performed or assessed with clinical judgement   ADL Overall ADL's : Needs assistance/impaired                    Upper Body Dressing Details (indicate cue type and reason): Education on technique for adherence to sternal preacutions.        Toilet Transfer Details (indicate cue type and reason): Patient provided urinal 2/2 frequent urination with education on activity pacing and energy conservation.            General ADL Comments: Bariatric RW delivered during treatment session. Walker adjusted to proper height and education provided on safety with functional transfers and walker management.      Vision       Perception     Praxis      Cognition Arousal/Alertness: Awake/alert Behavior During Therapy: WFL for tasks assessed/performed Overall Cognitive Status: Within Functional Limits for tasks assessed                                          Exercises     Shoulder Instructions       General Comments      Pertinent Vitals/ Pain       Pain Assessment: 0-10 Pain Score: 4  Pain Location: Incision  Pain Descriptors / Indicators: Sore Pain Intervention(s): Monitored during session  Home Living  Prior Functioning/Environment              Frequency           Progress Toward Goals  OT Goals(current goals can now be found in the care plan section)  Progress towards OT goals: Progressing toward goals  Acute Rehab OT Goals Patient Stated Goal: be able to be independent again OT Goal Formulation: With patient Time For Goal Achievement: 08/10/20 Potential to Achieve Goals: Good ADL Goals Pt Will Perform Upper Body Bathing: with min assist;sitting Pt Will Perform Lower Body Bathing: with min assist;sit to/from stand;sitting/lateral leans;with adaptive equipment Pt Will Perform Upper Body Dressing: with min assist;sitting Pt Will Perform Lower Body Dressing: with min assist;sitting/lateral leans;sit to/from stand;with adaptive equipment Pt Will Transfer to Toilet: with min guard  assist;ambulating;regular height toilet Additional ADL Goal #1: Pt will implement sternal precautions to all BADLs with min VC's Additional ADL Goal #2: Pt will recall and apply 3-5 ECS strategies to apply to typical BADL routine  Plan Discharge plan remains appropriate    Co-evaluation                 AM-PAC OT "6 Clicks" Daily Activity     Outcome Measure   Help from another person eating meals?: A Little Help from another person taking care of personal grooming?: A Little Help from another person toileting, which includes using toliet, bedpan, or urinal?: A Little Help from another person bathing (including washing, rinsing, drying)?: A Lot Help from another person to put on and taking off regular upper body clothing?: A Little Help from another person to put on and taking off regular lower body clothing?: A Lot 6 Click Score: 16    End of Session    OT Visit Diagnosis: Unsteadiness on feet (R26.81);Other abnormalities of gait and mobility (R26.89);Muscle weakness (generalized) (M62.81);Pain   Activity Tolerance Patient tolerated treatment well;Patient limited by fatigue   Patient Left Other (comment);with family/visitor present (Seated EOB in prep for d/c)   Nurse Communication          Time: 9381-0175 OT Time Calculation (min): 14 min  Charges: OT General Charges $OT Visit: 1 Visit OT Treatments $Self Care/Home Management : 8-22 mins  Ankita Newcomer H. OTR/L Supplemental OT, Department of rehab services [phone]   Zyla Dascenzo R H. 07/31/2020, 2:46 PM

## 2020-07-31 NOTE — Progress Notes (Signed)
      [address]       [place] [address]             [phone]      7 Days Post-Op Procedure(s) (LRB): CORONARY ARTERY BYPASS GRAFTING (CABG), ON PUMP, TIMES FOUR, USING LEFT INTERNAL MAMMARY ARTERY AND ENDOSCOPICALLY HARVESTED RIGHT GREATER SAPHENOUS VEIN (N/A) TRANSESOPHAGEAL ECHOCARDIOGRAM (TEE) (N/A) Subjective: Feels good this morning. He is still having some drainage from his right lower leg incision.   Objective: Vital signs in last 24 hours: Temp:  [97.9 F (36.6 C)-98.5 F (36.9 C)] 97.9 F (36.6 C) (08/12 0431) Pulse Rate:  [64-88] 64 (08/12 0431) Cardiac Rhythm: Normal sinus rhythm (08/11 1900) Resp:  [12-20] 20 (08/12 0628) BP: (101-125)/(75-90) 125/81 (08/12 0431) SpO2:  [92 %-100 %] 99 % (08/12 0431) Weight:  [171.1 kg] 171.1 kg (08/12 0628)     Intake/Output from previous day: 08/11 0701 - 08/12 0700 In: 340 [P.O.:340] Out: 900 [Urine:900] Intake/Output this shift: No intake/output data recorded.  General appearance: alert, cooperative and no distress Heart: regular rate and rhythm, S1, S2 normal, no murmur, click, rub or gallop Lungs: clear to auscultation bilaterally Abdomen: soft, non-tender; bowel sounds normal; no masses,  no organomegaly Extremities: extremities normal, atraumatic, no cyanosis or edema Wound: clean and dry  Lab Results: Recent Labs    07/29/20 0129  WBC 12.4*  HGB 7.6*  HCT 24.6*  PLT 303   BMET:  Recent Labs    07/29/20 0129  NA 137  K 4.9  CL 103  CO2 22  GLUCOSE 220*  BUN 37*  CREATININE 1.40*  CALCIUM 9.2    PT/INR: No results for input(s): LABPROT, INR in the last 72 hours. ABG    Component Value Date/Time   PHART 7.334 (L) 07/26/2020 0358   HCO3 21.9 07/26/2020 0358   TCO2 23 07/26/2020 0358   ACIDBASEDEF 4.0 (H) 07/26/2020 0358   O2SAT 92.0 07/26/2020 0358   CBG (last 3)  Recent Labs    07/30/20 1603 07/30/20 2105 07/31/20 0616  GLUCAP 141* 211* 213*     Assessment/Plan: S/P Procedure(s) (LRB): CORONARY ARTERY BYPASS GRAFTING (CABG), ON PUMP, TIMES FOUR, USING LEFT INTERNAL MAMMARY ARTERY AND ENDOSCOPICALLY HARVESTED RIGHT GREATER SAPHENOUS VEIN (N/A) TRANSESOPHAGEAL ECHOCARDIOGRAM (TEE) (N/A)  1. CV-NSR in the 60s, BP well controlled. Continue asa, statin, bb, and midodrine. 2. Pulm-tolerating room air with good oxygen support.  3. Renal-creatinine 1.40, electrolytes okay. Continue lasix 4. H and H 7.6/24.6, expected acute blood loss anemia 5. Endo-blood glucose poorly controlled. Levemir increased, also on meal coverage and SSI. He will need close outpatient follow-up for his diabetes.  6. On lovenox for VT prophylaxis  Plan: Took prevena off this morning. Incision is healing well. Right lower leg incision is still draining-encouraged to change dressing at least once a day. He is ready for discharge later today.   LOS: 12 days    Sharlene Dory 07/31/2020

## 2020-07-31 NOTE — Progress Notes (Signed)
Pt discharged from unit. Medication/discharge instruction given to pt and Wife , VsS  Everlean Cherry, RN

## 2020-08-01 NOTE — Telephone Encounter (Signed)
Unable to reach pt or leave a message mailbox is full 

## 2020-08-04 ENCOUNTER — Telehealth: Payer: Self-pay

## 2020-08-04 NOTE — Telephone Encounter (Signed)
Holding for you as Lorain Childes

## 2020-08-04 NOTE — Telephone Encounter (Signed)
FYI    Mayra Neer nurse practitioner @ [facility] called and stated pt is having signs of cellulitis in left foot and really both feet are red. She placed pt on an antibiotic but wanted Dr. Warren Danes advise on what to do with pt from here. Pt just got out of the hospital from a Bypass surgery so she didn't feel like he was a good surgical candidatet. I gave her the numbers of both Dr. [name] and Mardella Layman to discuss best treatment for pt   Her CB #s Office- [phone] Cell) [phone]

## 2020-08-04 NOTE — Telephone Encounter (Signed)
Patient contacted regarding discharge from [facility] on 07/31/20.  Patient understands to follow up with provider Edd Fabian NP on 08/14/20 at 9:15 AM at [facility]. Patient understands discharge instructions? yes Patient understands medications and regiment? yes Patient understands to bring all medications to this visit? yes  Ask patient:  Are you enrolled in My Chart (yes or no)  If no ask patient if they would like to enroll.  yes   Postop Surgical Patients:                What is your wound status? Any signs/ symptoms of infection (Temp, redness/ red streaks, swelling, purulent drainage, foul odor or smell)? --no redness, drainage, swelling, fever  .             Please do not place any creams/ lotions/ or antibiotic ointment on any surgical incisions/ wounds without physician approval. --Patient aware  .             Do you have any questions about your medications?  All medications (except pain medications) are to be filled by your Cardiologist AFTER your first post op appointment with them.  Are you taking your pain medication?. --he states he was taking pain medication on a scheduled basis prior to CABG, continues to take pain medication due to increased pain d/t recent surgery. Marland Kitchen             How is your pain controlled? Pain level? --patient feels like pain is controlled and continues to improve.  .             If you require a refill on pain medications, know that the same medication/ amount may not be prescribed or a refill may not be given.  Please contact your pharmacy for refill requests.  --patient aware  .             Do you have help at home with ADL's?  If you have home health, have you been contacted or seen by the agency? --wife is at home with him and helping him with ADLs  .             Please refer to your Pre/post surgery booklet, there is a lot of useful information in it that may answer any questions you may have. --patient states wife reads it everyday   .             Please note that it is ok to remove your surgical dressing, shower (soap/ water), and pat the incision dry. --patient aware

## 2020-08-07 ENCOUNTER — Ambulatory Visit (INDEPENDENT_AMBULATORY_CARE_PROVIDER_SITE_OTHER): Payer: Medicare Other

## 2020-08-07 ENCOUNTER — Encounter: Payer: Self-pay | Admitting: Orthopedic Surgery

## 2020-08-07 ENCOUNTER — Ambulatory Visit (INDEPENDENT_AMBULATORY_CARE_PROVIDER_SITE_OTHER): Payer: Medicare Other | Admitting: Orthopedic Surgery

## 2020-08-07 VITALS — Ht 77.0 in | Wt 377.0 lb

## 2020-08-07 DIAGNOSIS — M869 Osteomyelitis, unspecified: Secondary | ICD-10-CM | POA: Diagnosis not present

## 2020-08-07 DIAGNOSIS — M79672 Pain in left foot: Secondary | ICD-10-CM | POA: Diagnosis not present

## 2020-08-07 NOTE — Progress Notes (Signed)
Office Visit Note   Patient: Joseph [name]           Date of Birth: August 02, 1971           MRN: [id number] Visit Date: 08/07/2020              Requested by: Marva Panda, NP [address] [place],  [name] [address] PCP: Marva Panda, NP  Chief Complaint  Patient presents with  . Left Foot - Open Wound      HPI: Patient is a 49 year old gentleman who is status post CABG surgery approximately 2 weeks ago.  Patient complains of cellulitis and necrotic ulceration dorsum of the left foot fourth toe he is currently on Keflex 4 times a day patient is status post a toe amputation the right foot as well.  Assessment & Plan: Visit Diagnoses:  1. Pain in left foot     Plan: Recommended proceeding with an amputation of the fourth ray left foot.  We will set this up for the patient next week.  Plan for outpatient surgery with regional anesthetic.  Follow-Up Instructions: No follow-ups on file.   Ortho Exam  Patient is alert, oriented, no adenopathy, well-dressed, normal affect, normal respiratory effort. Examination patient is ambulating wheelchair.  He has stable healing vein harvest incisions on the right lower extremity left lower extremity has significant swelling I cannot palpate a pulse the Doppler was used and he has a good biphasic dorsalis pedis and posterior tibial pulse.  Examination of the left foot he has cellulitis that is extending up the dorsum of the foot with pitting edema up to the tibial tubercle.  There is a necrotic ulcer over the dorsum of the fourth toe left foot with exposed tendon.  Ulcerative area approximately 1 x 2 cm.  Most recent hemoglobin A1c 9.4.  Imaging: No results found. No images are attached to the encounter.  Labs: Lab Results  Component Value Date   HGBA1C 9.4 (H) 07/18/2020   HGBA1C 8.2 (H) 11/02/2019   HGBA1C 8.2 (H) 11/01/2019   ESRSEDRATE 41 (H) 09/16/2017   CRP 8.9 (H) 09/16/2017   REPTSTATUS 07/23/2020 FINAL 07/18/2020    REPTSTATUS 07/23/2020 FINAL 07/18/2020   CULT  07/18/2020    NO GROWTH 5 DAYS Performed at [facility], [address] [place], [name] [address]    CULT  07/18/2020    NO GROWTH 5 DAYS Performed at [facility], [address] [place], [name] [address]      Lab Results  Component Value Date   ALBUMIN 2.9 (L) 07/27/2020   ALBUMIN 2.9 (L) 07/26/2020   ALBUMIN 3.0 (L) 07/21/2020    Lab Results  Component Value Date   MG 2.0 07/25/2020   MG 2.1 07/25/2020   MG 2.3 07/24/2020   No results found for: VD25OH  No results found for: PREALBUMIN CBC EXTENDED Latest Ref Rng & Units 07/29/2020 07/28/2020 07/27/2020  WBC 4.0 - 10.5 K/uL 12.4(H) 10.6(H) 14.3(H)  RBC 4.22 - 5.81 MIL/uL 2.62(L) 2.49(L) 2.61(L)  HGB 13.0 - 17.0 g/dL 7.6(L) 7.2(L) 7.7(L)  HCT 39 - 52 % 24.6(L) 23.0(L) 24.1(L)  PLT 150 - 400 K/uL 303 230 244  NEUTROABS 1.7 - 7.7 K/uL - - -  LYMPHSABS 0.7 - 4.0 K/uL - - -     Body mass index is 44.71 kg/m.  Orders:  Orders Placed This Encounter  Procedures  . XR Foot 2 Views Left   No orders of the defined types were placed in  this encounter.    Procedures: No procedures performed  Clinical Data: No additional findings.  ROS:  All other systems negative, except as noted in the HPI. Review of Systems  Objective: Vital Signs: Ht 6\' 5"  (1.956 m)   Wt (!) 377 lb (171 kg)   BMI 44.71 kg/m   Specialty Comments:  No specialty comments available.  PMFS History: Patient Active Problem List   Diagnosis Date Noted  . S/P CABG x 4 07/24/2020  . Non-ST elevation (NSTEMI) myocardial infarction (HCC) 07/22/2020  . Acute on chronic diastolic heart failure (HCC) 07/22/2020  . Hypotension 07/18/2020  . Left leg swelling 07/18/2020  . Elevated troponin 07/18/2020  . AKI (acute kidney injury) (HCC) 07/18/2020  . Elevated brain natriuretic peptide (BNP) level 07/18/2020  . GERD (gastroesophageal reflux disease) 07/18/2020  . Severe Sepsis secondary to Lt  leg/Foot Cellulitis 07/18/2020  . Severe sepsis --Severe Sepsis secondary to Lt leg/Foot Cellulitis with hypotension and AKI 07/18/2020  . Unstable angina (HCC) 11/01/2019  . Edema 06/13/2019  . Leukocytosis 05/31/2019  . Smoker 03/08/2019  . Chronic pain 03/08/2019  . Chronic back pain 02/16/2019  . Deep venous thrombosis (HCC) 02/16/2019  . Obesity, Class III, BMI 40-49.9 (morbid obesity) (HCC) 02/16/2019  . Peripheral nerve disease 02/16/2019  . COPD (chronic obstructive pulmonary disease) (HCC) 01/22/2019  . Anxiety disorder 03/26/2018  . Open wound of toe 10/12/2017  . Sinusitis 10/12/2017  . Abdominal pain   . Loss of weight   . Diabetic foot infection (HCC) 09/16/2017  . Uncontrolled type 2 diabetes mellitus with hyperglycemia, with long-term current use of insulin (HCC) 09/16/2017  . Cellulitis of right foot   . Chronic ulcer of great toe of right foot (HCC) 09/09/2017  . Recurrent chest pain 07/29/2016  . Hyperglycemia   . Insulin dependent type 2 diabetes mellitus (HCC) 04/29/2015  . OSA (obstructive sleep apnea) 05/08/2013  . History of pulmonary embolus (PE) 04/27/2013  . CAD -S/P PCI 11/02/19  09/10/2008  . Dyslipidemia 09/09/2008   Past Medical History:  Diagnosis Date  . Anxiety   . Arthritis    "knees, shoulders, hips, ankles" (07/29/2016)  . Asthma   . CAD (coronary artery disease)    a. 2017: s/p BMS to distal Cx; b. LHC 05/30/2019: 80% mid, distal RCA s/p DES, 30% narrowing of d LM, widely patent LAD w/ luminal irregularities, widely patent stent in dCX  w/ 90+% stenosis distal to stent beofre small trifurcating obtuse marginal (potentially area of restenosis)  . Chronic bronchitis (HCC)   . Chronic ulcer of right great toe (HCC) 09/19/2017  . COPD (chronic obstructive pulmonary disease) (HCC)   . Depression   . DVT (deep venous thrombosis) (HCC)   . GERD (gastroesophageal reflux disease)   . Hyperlipidemia   . Hypertension   . Myocardial infarction (HCC)  1996   "light one"  . PE (pulmonary embolism) 04/2013   On chronic Xarelto  . Peripheral nerve disease   . Pneumonia "several times"  . Type II diabetes mellitus (HCC)     Family History  Problem Relation Age of Onset  . Hypertension Brother   . Hypertension Father   . Diabetes Other   . Hyperlipidemia Other     Past Surgical History:  Procedure Laterality Date  . AMPUTATION Right 09/21/2017   Procedure: RIGHT GREAT TOE AMPUTATION, POSSIBLE VAC;  Surgeon: 11/21/2017, MD;  Location: [facility] OR;  Service: Orthopedics;  Laterality: Right;  . CARDIAC CATHETERIZATION  2006  .  CARDIAC CATHETERIZATION  1996   "@ [facility]; when I had my heart attack"  . CARDIAC CATHETERIZATION N/A 07/29/2016   Procedure: Left Heart Cath and Coronary Angiography;  Surgeon: Runell Gess, MD;  Location: [facility] INVASIVE CV LAB;  Service: Cardiovascular;  Laterality: N/A;  . CARDIAC CATHETERIZATION N/A 07/29/2016   Procedure: Coronary Stent Intervention;  Surgeon: Runell Gess, MD;  Location: [facility] INVASIVE CV LAB;  Service: Cardiovascular;  Laterality: N/A;  . CARPAL TUNNEL RELEASE Bilateral   . CORONARY ANGIOPLASTY WITH STENT PLACEMENT  07/29/2016  . CORONARY ARTERY BYPASS GRAFT N/A 07/24/2020   Procedure: CORONARY ARTERY BYPASS GRAFTING (CABG), ON PUMP, TIMES FOUR, USING LEFT INTERNAL MAMMARY ARTERY AND ENDOSCOPICALLY HARVESTED RIGHT GREATER SAPHENOUS VEIN;  Surgeon: Corliss Skains, MD;  Location: [facility] OR;  Service: Open Heart Surgery;  Laterality: N/A;  FLOW TAC  . CORONARY STENT INTERVENTION N/A 05/30/2019   Procedure: CORONARY STENT INTERVENTION;  Surgeon: Lyn Records, MD;  Location: [facility] INVASIVE CV LAB;  Service: Cardiovascular;  Laterality: N/A;  . CORONARY STENT INTERVENTION N/A 11/02/2019   Procedure: CORONARY STENT INTERVENTION;  Surgeon: Iran Ouch, MD;  Location: [facility] INVASIVE CV LAB;  Service: Cardiovascular;  Laterality: N/A;  . ESOPHAGOGASTRODUODENOSCOPY N/A 09/22/2017   Procedure:  ESOPHAGOGASTRODUODENOSCOPY (EGD);  Surgeon: Meryl Dare, MD;  Location: [facility] ENDOSCOPY;  Service: Endoscopy;  Laterality: N/A;  . INTRAVASCULAR PRESSURE WIRE/FFR STUDY N/A 07/22/2020   Procedure: INTRAVASCULAR PRESSURE WIRE/FFR STUDY;  Surgeon: Marykay Lex, MD;  Location: [facility] INVASIVE CV LAB;  Service: Cardiovascular;  Laterality: N/A;  . KNEE ARTHROSCOPY Bilateral    "2 on left; 1 on the right"  . LEFT HEART CATH AND CORONARY ANGIOGRAPHY N/A 11/02/2019   Procedure: LEFT HEART CATH AND CORONARY ANGIOGRAPHY;  Surgeon: Iran Ouch, MD;  [facility]: [facility] INVASIVE CV LAB;  Service: Cardiovascular;  Laterality: N/A;  . LEFT HEART CATH AND CORONARY ANGIOGRAPHY N/A 07/22/2020   Procedure: LEFT HEART CATH AND CORONARY ANGIOGRAPHY;  Surgeon: Marykay Lex, MD;  Location: [facility] INVASIVE CV LAB;  Service: Cardiovascular;  Laterality: N/A;  . RIGHT/LEFT HEART CATH AND CORONARY ANGIOGRAPHY N/A 05/30/2019   Procedure: RIGHT/LEFT HEART CATH AND CORONARY ANGIOGRAPHY;  Surgeon: Lyn Records, MD;  Location: [facility] INVASIVE CV LAB;  Service: Cardiovascular;  Laterality: N/A;  . SHOULDER OPEN ROTATOR CUFF REPAIR Bilateral   . TEE WITHOUT CARDIOVERSION N/A 07/24/2020   Procedure: TRANSESOPHAGEAL ECHOCARDIOGRAM (TEE);  Surgeon: Corliss Skains, MD;  Location: [facility] OR;  Service: Open Heart Surgery;  Laterality: N/A;   Social History   Occupational History  . Occupation: disabled     Associate Professor: UNEMPLOYED  Tobacco Use  . Smoking status: Current Every Day Smoker    Packs/day: 1.00    Years: 34.00    Pack years: 34.00    Types: Cigarettes  . Smokeless tobacco: Former Neurosurgeon    Types: Snuff, Chew  . Tobacco comment: 8/910/2017 "3 ppd before 2015"  Vaping Use  . Vaping Use: Never used  Substance and Sexual Activity  . Alcohol use: Yes    Comment: RARE  . Drug use: No  . Sexual activity: Yes

## 2020-08-08 ENCOUNTER — Other Ambulatory Visit: Payer: Self-pay

## 2020-08-08 ENCOUNTER — Ambulatory Visit (INDEPENDENT_AMBULATORY_CARE_PROVIDER_SITE_OTHER): Payer: Self-pay | Admitting: Thoracic Surgery (Cardiothoracic Vascular Surgery)

## 2020-08-08 ENCOUNTER — Encounter: Payer: Self-pay | Admitting: Thoracic Surgery (Cardiothoracic Vascular Surgery)

## 2020-08-08 VITALS — BP 92/65 | HR 96 | Temp 97.7°F | Resp 20 | Ht 77.0 in | Wt 362.0 lb

## 2020-08-08 DIAGNOSIS — I2510 Atherosclerotic heart disease of native coronary artery without angina pectoris: Secondary | ICD-10-CM

## 2020-08-08 DIAGNOSIS — Z951 Presence of aortocoronary bypass graft: Secondary | ICD-10-CM

## 2020-08-08 NOTE — Progress Notes (Signed)
      [address]       [place] [address]             [phone]        Joseph [name] [place] Medical Record #[id number] Date of Birth: Dec 01, 1971  Referring: Joseph Si, MD Primary Care: Joseph Panda, NP Primary Cardiologist:Joseph [place] Salvia, MD  Reason for visit:   follow-up  History of Present Illness:     [name] comes in for his first follow-up appointment after undergoing CABG.  Complains of a lot of chest wall pain.  He was on chronic pain medication prior to this.  Additionally his left fourth toe is scheduled to be amputated.  Otherwise he has been compliant  Physical Exam: BP 92/65   Pulse 96   Temp 97.7 F (36.5 C) (Skin)   Resp 20   Ht 6\' 5"  (1.956 m)   Wt (!) 362 lb (164.2 kg)   SpO2 97% Comment: RA  BMI 42.93 kg/m   Alert NAD Incision clean.  Sternum stable Abdomen soft, ND No peripheral edema       Assessment / Plan:   49 year old male status post CABG.  On chronic pain medication that is being held by a  pain service. He will return in 1 month with a chest x-ray Blood pressure recommend that he continue to hydrate.  57 08/08/2020 2:12 PM

## 2020-08-11 ENCOUNTER — Other Ambulatory Visit: Payer: Self-pay | Admitting: Physician Assistant

## 2020-08-12 ENCOUNTER — Encounter (HOSPITAL_COMMUNITY): Payer: Self-pay | Admitting: Orthopedic Surgery

## 2020-08-12 ENCOUNTER — Other Ambulatory Visit: Payer: Self-pay

## 2020-08-12 MED ORDER — DEXTROSE 5 % IV SOLN
3.0000 g | INTRAVENOUS | Status: DC
  Filled 2020-08-12: qty 3000

## 2020-08-12 MED ORDER — VANCOMYCIN HCL 1500 MG/300ML IV SOLN
1500.0000 mg | INTRAVENOUS | Status: AC
  Administered 2020-08-13: 1500 mg via INTRAVENOUS
  Filled 2020-08-12: qty 300

## 2020-08-12 NOTE — Progress Notes (Signed)
Patient denies chest pain or shortness of breath. Recent cardiac surgery. Medication instructions provided to wife. She reports patient blood sugar has been dropping and that he has had some low blood sugars (50's). I provided the below instructions regarding DM with the exception that I told her if his blood sugar was between 70- 120 call before giving his morning dose of Lantus, let the RN know that he has been having lows, and ask if it is ok to give the Lantus.     How to Manage Your Diabetes Before and After Surgery  Why is it important to control my blood sugar before and after surgery? . Improving blood sugar levels before and after surgery helps healing and can limit problems. . A way of improving blood sugar control is eating a healthy diet by: o  Eating less sugar and carbohydrates o  Increasing activity/exercise o  Talking with your doctor about reaching your blood sugar goals . High blood sugars (greater than 180 mg/dL) can raise your risk of infections and slow your recovery, so you will need to focus on controlling your diabetes during the weeks before surgery. . Make sure that the doctor who takes care of your diabetes knows about your planned surgery including the date and location.  How do I manage my blood sugar before surgery? . Check your blood sugar at least 4 times a day, starting 2 days before surgery, to make sure that the level is not too high or low. o Check your blood sugar the morning of your surgery when you wake up and every 2 hours until you get to the Short Stay unit. . If your blood sugar is less than 70 mg/dL, you will need to treat for low blood sugar: o Do not take insulin. o Treat a low blood sugar (less than 70 mg/dL) with  cup of clear juice (cranberry or apple), 4 glucose tablets, OR glucose gel. Recheck blood sugar in 15 minutes after treatment (to make sure it is greater than 70 mg/dL). If your blood sugar is not greater than 70 mg/dL on recheck, call  [phone] o  for further instructions. . Report your blood sugar to the short stay nurse when you get to Short Stay.  . If you are admitted to the hospital after surgery: o Your blood sugar will be checked by the staff and you will probably be given insulin after surgery (instead of oral diabetes medicines) to make sure you have good blood sugar levels. o The goal for blood sugar control after surgery is 80-180 mg/dL.     WHAT DO I DO ABOUT MY DIABETES MEDICATION?   Marland Kitchen Do not take oral diabetes medicines (pills) the morning of surgery.  . THE NIGHT BEFORE SURGERY, take ___20________ units of _Lantus__________insulin.     . THE MORNING OF SURGERY, take ________20_____ units of __Lantus________insulin.  . The day of surgery, do not take other diabetes injectables, including Byetta (exenatide), Bydureon (exenatide ER), Victoza (liraglutide), or Trulicity (dulaglutide).  . If your CBG is greater than 220 mg/dL, you may take  of your sliding scale (correction) dose of insulin.

## 2020-08-12 NOTE — Progress Notes (Signed)
Dr. Sampson Goon, E notified of cardiac history.

## 2020-08-13 ENCOUNTER — Encounter (HOSPITAL_COMMUNITY): Payer: Self-pay | Admitting: Orthopedic Surgery

## 2020-08-13 ENCOUNTER — Ambulatory Visit (HOSPITAL_COMMUNITY)
Admission: RE | Admit: 2020-08-13 | Discharge: 2020-08-13 | Disposition: A | Discharge status: Home / Self Care | Payer: Medicare Other | Attending: Orthopedic Surgery | Admitting: Orthopedic Surgery

## 2020-08-13 ENCOUNTER — Encounter (HOSPITAL_COMMUNITY)
Admission: RE | Disposition: A | Discharge status: Home / Self Care | Payer: Self-pay | Source: Home / Self Care | Attending: Orthopedic Surgery

## 2020-08-13 ENCOUNTER — Ambulatory Visit (HOSPITAL_COMMUNITY): Payer: Medicare Other | Admitting: Anesthesiology

## 2020-08-13 ENCOUNTER — Other Ambulatory Visit: Payer: Self-pay

## 2020-08-13 DIAGNOSIS — Z8249 Family history of ischemic heart disease and other diseases of the circulatory system: Secondary | ICD-10-CM | POA: Insufficient documentation

## 2020-08-13 DIAGNOSIS — M869 Osteomyelitis, unspecified: Secondary | ICD-10-CM | POA: Insufficient documentation

## 2020-08-13 DIAGNOSIS — Z86711 Personal history of pulmonary embolism: Secondary | ICD-10-CM | POA: Insufficient documentation

## 2020-08-13 DIAGNOSIS — I252 Old myocardial infarction: Secondary | ICD-10-CM | POA: Insufficient documentation

## 2020-08-13 DIAGNOSIS — M160 Bilateral primary osteoarthritis of hip: Secondary | ICD-10-CM | POA: Diagnosis not present

## 2020-08-13 DIAGNOSIS — Z951 Presence of aortocoronary bypass graft: Secondary | ICD-10-CM | POA: Diagnosis not present

## 2020-08-13 DIAGNOSIS — F419 Anxiety disorder, unspecified: Secondary | ICD-10-CM | POA: Diagnosis not present

## 2020-08-13 DIAGNOSIS — I2510 Atherosclerotic heart disease of native coronary artery without angina pectoris: Secondary | ICD-10-CM | POA: Insufficient documentation

## 2020-08-13 DIAGNOSIS — E11621 Type 2 diabetes mellitus with foot ulcer: Secondary | ICD-10-CM | POA: Diagnosis not present

## 2020-08-13 DIAGNOSIS — M19012 Primary osteoarthritis, left shoulder: Secondary | ICD-10-CM | POA: Diagnosis not present

## 2020-08-13 DIAGNOSIS — G4730 Sleep apnea, unspecified: Secondary | ICD-10-CM | POA: Diagnosis not present

## 2020-08-13 DIAGNOSIS — K219 Gastro-esophageal reflux disease without esophagitis: Secondary | ICD-10-CM | POA: Insufficient documentation

## 2020-08-13 DIAGNOSIS — Z955 Presence of coronary angioplasty implant and graft: Secondary | ICD-10-CM | POA: Insufficient documentation

## 2020-08-13 DIAGNOSIS — E785 Hyperlipidemia, unspecified: Secondary | ICD-10-CM | POA: Diagnosis not present

## 2020-08-13 DIAGNOSIS — M19071 Primary osteoarthritis, right ankle and foot: Secondary | ICD-10-CM | POA: Diagnosis not present

## 2020-08-13 DIAGNOSIS — M170 Bilateral primary osteoarthritis of knee: Secondary | ICD-10-CM | POA: Insufficient documentation

## 2020-08-13 DIAGNOSIS — M19011 Primary osteoarthritis, right shoulder: Secondary | ICD-10-CM | POA: Insufficient documentation

## 2020-08-13 DIAGNOSIS — Z8349 Family history of other endocrine, nutritional and metabolic diseases: Secondary | ICD-10-CM | POA: Insufficient documentation

## 2020-08-13 DIAGNOSIS — Z882 Allergy status to sulfonamides status: Secondary | ICD-10-CM | POA: Insufficient documentation

## 2020-08-13 DIAGNOSIS — E1169 Type 2 diabetes mellitus with other specified complication: Secondary | ICD-10-CM | POA: Insufficient documentation

## 2020-08-13 DIAGNOSIS — L97529 Non-pressure chronic ulcer of other part of left foot with unspecified severity: Secondary | ICD-10-CM | POA: Diagnosis not present

## 2020-08-13 DIAGNOSIS — Z87891 Personal history of nicotine dependence: Secondary | ICD-10-CM | POA: Insufficient documentation

## 2020-08-13 DIAGNOSIS — Z7901 Long term (current) use of anticoagulants: Secondary | ICD-10-CM | POA: Insufficient documentation

## 2020-08-13 DIAGNOSIS — Z86718 Personal history of other venous thrombosis and embolism: Secondary | ICD-10-CM | POA: Insufficient documentation

## 2020-08-13 DIAGNOSIS — I10 Essential (primary) hypertension: Secondary | ICD-10-CM | POA: Diagnosis not present

## 2020-08-13 DIAGNOSIS — F329 Major depressive disorder, single episode, unspecified: Secondary | ICD-10-CM | POA: Insufficient documentation

## 2020-08-13 DIAGNOSIS — Z89421 Acquired absence of other right toe(s): Secondary | ICD-10-CM | POA: Insufficient documentation

## 2020-08-13 DIAGNOSIS — M19072 Primary osteoarthritis, left ankle and foot: Secondary | ICD-10-CM | POA: Diagnosis not present

## 2020-08-13 DIAGNOSIS — Z833 Family history of diabetes mellitus: Secondary | ICD-10-CM | POA: Insufficient documentation

## 2020-08-13 DIAGNOSIS — E1142 Type 2 diabetes mellitus with diabetic polyneuropathy: Secondary | ICD-10-CM | POA: Insufficient documentation

## 2020-08-13 DIAGNOSIS — J449 Chronic obstructive pulmonary disease, unspecified: Secondary | ICD-10-CM | POA: Diagnosis not present

## 2020-08-13 DIAGNOSIS — Z20822 Contact with and (suspected) exposure to covid-19: Secondary | ICD-10-CM | POA: Insufficient documentation

## 2020-08-13 HISTORY — DX: Sleep apnea, unspecified: G47.30

## 2020-08-13 HISTORY — PX: AMPUTATION: SHX166

## 2020-08-13 LAB — GLUCOSE, CAPILLARY
Glucose-Capillary: 140 mg/dL — ABNORMAL HIGH (ref 70–99)
Glucose-Capillary: 94 mg/dL (ref 70–99)
Glucose-Capillary: 96 mg/dL (ref 70–99)

## 2020-08-13 LAB — BASIC METABOLIC PANEL
Anion gap: 12 (ref 5–15)
BUN: 32 mg/dL — ABNORMAL HIGH (ref 6–20)
CO2: 20 mmol/L — ABNORMAL LOW (ref 22–32)
Calcium: 9.1 mg/dL (ref 8.9–10.3)
Chloride: 108 mmol/L (ref 98–111)
Creatinine, Ser: 1.8 mg/dL — ABNORMAL HIGH (ref 0.61–1.24)
GFR calc Af Amer: 51 mL/min — ABNORMAL LOW (ref >60)
GFR calc non Af Amer: 44 mL/min — ABNORMAL LOW (ref >60)
Glucose, Bld: 147 mg/dL — ABNORMAL HIGH (ref 70–99)
Potassium: 6.1 mmol/L — ABNORMAL HIGH (ref 3.5–5.1)
Sodium: 140 mmol/L (ref 135–145)

## 2020-08-13 LAB — CBC
HCT: 30.8 % — ABNORMAL LOW (ref 39.0–52.0)
Hemoglobin: 8.9 g/dL — ABNORMAL LOW (ref 13.0–17.0)
MCH: 27.6 pg (ref 26.0–34.0)
MCHC: 28.9 g/dL — ABNORMAL LOW (ref 30.0–36.0)
MCV: 95.4 fL (ref 80.0–100.0)
Platelets: 266 10*3/uL (ref 150–400)
RBC: 3.23 MIL/uL — ABNORMAL LOW (ref 4.22–5.81)
RDW: 15.2 % (ref 11.5–15.5)
WBC: 7.5 10*3/uL (ref 4.0–10.5)
nRBC: 0 % (ref 0.0–0.2)

## 2020-08-13 LAB — POCT I-STAT, CHEM 8
BUN: 31 mg/dL — ABNORMAL HIGH (ref 6–20)
Calcium, Ion: 1.23 mmol/L (ref 1.15–1.40)
Chloride: 106 mmol/L (ref 98–111)
Creatinine, Ser: 1.8 mg/dL — ABNORMAL HIGH (ref 0.61–1.24)
Glucose, Bld: 139 mg/dL — ABNORMAL HIGH (ref 70–99)
HCT: 26 % — ABNORMAL LOW (ref 39.0–52.0)
Hemoglobin: 8.8 g/dL — ABNORMAL LOW (ref 13.0–17.0)
Potassium: 4.8 mmol/L (ref 3.5–5.1)
Sodium: 143 mmol/L (ref 135–145)
TCO2: 23 mmol/L (ref 22–32)

## 2020-08-13 LAB — SARS CORONAVIRUS 2 BY RT PCR (HOSPITAL ORDER, PERFORMED IN ~~LOC~~ HOSPITAL LAB): SARS Coronavirus 2: NEGATIVE

## 2020-08-13 SURGERY — AMPUTATION, FOOT, RAY
Anesthesia: Regional | Site: Foot | Laterality: Left

## 2020-08-13 MED ORDER — ORAL CARE MOUTH RINSE: 15.0000 mL | Freq: Once | OROMUCOSAL | Status: AC

## 2020-08-13 MED ORDER — 0.9 % SODIUM CHLORIDE (POUR BTL) OPTIME
TOPICAL | Status: DC | PRN
  Administered 2020-08-13: 1000 mL

## 2020-08-13 MED ORDER — DEXTROSE 50 % IV SOLN
INTRAVENOUS | Status: AC
  Administered 2020-08-13: 50 mL via INTRAVENOUS
  Filled 2020-08-13: qty 50

## 2020-08-13 MED ORDER — INSULIN ASPART 100 UNIT/ML ~~LOC~~ SOLN
10.0000 [IU] | Freq: Once | SUBCUTANEOUS | Status: AC
  Administered 2020-08-13: 10 [IU] via INTRAVENOUS

## 2020-08-13 MED ORDER — FENTANYL CITRATE (PF) 250 MCG/5ML IJ SOLN
INTRAMUSCULAR | Status: AC
  Filled 2020-08-13: qty 5

## 2020-08-13 MED ORDER — MIDAZOLAM HCL 2 MG/2ML IJ SOLN
INTRAMUSCULAR | Status: AC
  Filled 2020-08-13: qty 2

## 2020-08-13 MED ORDER — IPRATROPIUM-ALBUTEROL 0.5-2.5 (3) MG/3ML IN SOLN: 3.0000 mL | Freq: Once | RESPIRATORY_TRACT | Status: DC

## 2020-08-13 MED ORDER — CHLORHEXIDINE GLUCONATE 0.12 % MT SOLN
15.0000 mL | Freq: Once | OROMUCOSAL | Status: AC
  Administered 2020-08-13: 15 mL via OROMUCOSAL
  Filled 2020-08-13: qty 15

## 2020-08-13 MED ORDER — BUPIVACAINE-EPINEPHRINE (PF) 0.5% -1:200000 IJ SOLN
INTRAMUSCULAR | Status: DC | PRN
  Administered 2020-08-13: 30 mL via PERINEURAL

## 2020-08-13 MED ORDER — ALBUTEROL SULFATE (2.5 MG/3ML) 0.083% IN NEBU
INHALATION_SOLUTION | RESPIRATORY_TRACT | Status: AC
  Administered 2020-08-13: 2.5 mg via RESPIRATORY_TRACT
  Filled 2020-08-13: qty 3

## 2020-08-13 MED ORDER — SODIUM CHLORIDE 0.9 % IV SOLN: INTRAVENOUS | Status: DC | PRN

## 2020-08-13 MED ORDER — LACTATED RINGERS IV SOLN: INTRAVENOUS | Status: DC

## 2020-08-13 MED ORDER — MIDAZOLAM HCL 2 MG/2ML IJ SOLN
INTRAMUSCULAR | Status: AC
  Administered 2020-08-13: 1 mg via INTRAVENOUS
  Filled 2020-08-13: qty 2

## 2020-08-13 MED ORDER — MIDAZOLAM HCL 2 MG/2ML IJ SOLN: 1.0000 mg | Freq: Once | INTRAMUSCULAR | Status: AC

## 2020-08-13 MED ORDER — ALBUTEROL SULFATE (2.5 MG/3ML) 0.083% IN NEBU: 2.5000 mg | INHALATION_SOLUTION | Freq: Once | RESPIRATORY_TRACT | Status: AC

## 2020-08-13 MED ORDER — DEXTROSE 50 % IV SOLN: 1.0000 | INTRAVENOUS | Status: AC

## 2020-08-13 MED ORDER — FENTANYL CITRATE (PF) 100 MCG/2ML IJ SOLN
INTRAMUSCULAR | Status: AC
  Filled 2020-08-13: qty 2

## 2020-08-13 MED ORDER — SODIUM CHLORIDE 0.9 % IV SOLN: INTRAVENOUS | Status: DC

## 2020-08-13 SURGICAL SUPPLY — 30 items
BLADE SAW SGTL MED 73X18.5 STR (BLADE) IMPLANT
BLADE SURG 21 STRL SS (BLADE) ×2 IMPLANT
BNDG COHESIVE 4X5 TAN STRL (GAUZE/BANDAGES/DRESSINGS) ×2 IMPLANT
BNDG GAUZE ELAST 4 BULKY (GAUZE/BANDAGES/DRESSINGS) ×2 IMPLANT
COVER SURGICAL LIGHT HANDLE (MISCELLANEOUS) ×4 IMPLANT
COVER WAND RF STERILE (DRAPES) ×1 IMPLANT
DRAPE U-SHAPE 47X51 STRL (DRAPES) ×4 IMPLANT
DRSG ADAPTIC 3X8 NADH LF (GAUZE/BANDAGES/DRESSINGS) ×2 IMPLANT
DRSG PAD ABDOMINAL 8X10 ST (GAUZE/BANDAGES/DRESSINGS) ×4 IMPLANT
DURAPREP 26ML APPLICATOR (WOUND CARE) ×2 IMPLANT
ELECT REM PT RETURN 9FT ADLT (ELECTROSURGICAL) ×2
ELECTRODE REM PT RTRN 9FT ADLT (ELECTROSURGICAL) ×1 IMPLANT
GAUZE SPONGE 4X4 12PLY STRL (GAUZE/BANDAGES/DRESSINGS) ×2 IMPLANT
GAUZE SPONGE 4X4 12PLY STRL LF (GAUZE/BANDAGES/DRESSINGS) ×1 IMPLANT
GLOVE BIOGEL PI IND STRL 9 (GLOVE) ×1 IMPLANT
GLOVE BIOGEL PI INDICATOR 9 (GLOVE) ×1
GLOVE SURG ORTHO 9.0 STRL STRW (GLOVE) ×2 IMPLANT
GOWN STRL REUS W/ TWL XL LVL3 (GOWN DISPOSABLE) ×2 IMPLANT
GOWN STRL REUS W/TWL XL LVL3 (GOWN DISPOSABLE) ×4
KIT BASIN OR (CUSTOM PROCEDURE TRAY) ×2 IMPLANT
KIT TURNOVER KIT B (KITS) ×2 IMPLANT
NS IRRIG 1000ML POUR BTL (IV SOLUTION) ×2 IMPLANT
PACK ORTHO EXTREMITY (CUSTOM PROCEDURE TRAY) ×2 IMPLANT
PAD ABD 8X10 STRL (GAUZE/BANDAGES/DRESSINGS) ×1 IMPLANT
PAD ARMBOARD 7.5X6 YLW CONV (MISCELLANEOUS) ×4 IMPLANT
STOCKINETTE IMPERVIOUS LG (DRAPES) IMPLANT
SUT ETHILON 2 0 PSLX (SUTURE) ×2 IMPLANT
TOWEL GREEN STERILE (TOWEL DISPOSABLE) ×2 IMPLANT
TUBE CONNECTING 12X1/4 (SUCTIONS) ×2 IMPLANT
YANKAUER SUCT BULB TIP NO VENT (SUCTIONS) ×2 IMPLANT

## 2020-08-13 NOTE — Op Note (Signed)
08/13/2020  3:03 PM  PATIENT:  [name]    PRE-OPERATIVE DIAGNOSIS:  Osteomyelitis Left 4th Toe  POST-OPERATIVE DIAGNOSIS:  Same  PROCEDURE:  LEFT FOOT 4TH RAY AMPUTATION  SURGEON:  [name], MD  PHYSICIAN ASSISTANT:None ANESTHESIA:   General  PREOPERATIVE INDICATIONS:  Joseph [name] is a  49 y.o. male with a diagnosis of Osteomyelitis Left 4th Toe who failed conservative measures and elected for surgical management.    The risks benefits and alternatives were discussed with the patient preoperatively including but not limited to the risks of infection, bleeding, nerve injury, cardiopulmonary complications, the need for revision surgery, among others, and the patient was willing to proceed.  OPERATIVE IMPLANTS: None  @ENCIMAGES @  OPERATIVE FINDINGS: Good petechial bleeding no deep abscess  OPERATIVE PROCEDURE: Patient was brought the operating room after undergoing a popliteal block.  After adequate levels anesthesia were obtained patient's left lower extremity was prepped using DuraPrep draped into a sterile field a timeout was called.  AV incision was made around the fourth ray the ray was resected through the midshaft.  The wound was irrigated with normal saline electrocautery was used for hemostasis the wound was closed using 2-0 nylon a sterile dressing was applied patient was taken the PACU in stable condition   DISCHARGE PLANNING:  Antibiotic duration: Preoperative antibiotics  Weightbearing: Touchdown weightbearing on the left heel  Pain medication: Prescription for Percocet  Dressing care/ Wound VAC: Change dressing at follow-up in the office  Ambulatory devices: Walker or crutches  Discharge to: Home.  Follow-up: In the office 1 week post operative.

## 2020-08-13 NOTE — H&P (Signed)
Joseph [name] is an 49 y.o. male.   Chief Complaint: Left Foot Osteomyelitis HPI: Patient is a 49 year old gentleman who is status post CABG surgery approximately 2 weeks ago.  Patient complains of cellulitis and necrotic ulceration dorsum of the left foot fourth toe he is currently on Keflex 4 times a day patient is status post a toe amputation the right foot as well.  Past Medical History:  Diagnosis Date  . Anxiety   . Arthritis    "knees, shoulders, hips, ankles" (07/29/2016)  . Asthma   . CAD (coronary artery disease)    a. 2017: s/p BMS to distal Cx; b. LHC 05/30/2019: 80% mid, distal RCA s/p DES, 30% narrowing of d LM, widely patent LAD w/ luminal irregularities, widely patent stent in dCX  w/ 90+% stenosis distal to stent beofre small trifurcating obtuse marginal (potentially area of restenosis)  . Chronic bronchitis (HCC)   . Chronic ulcer of right great toe (HCC) 09/19/2017  . COPD (chronic obstructive pulmonary disease) (HCC)   . Depression   . DVT (deep venous thrombosis) (HCC)   . GERD (gastroesophageal reflux disease)   . Hyperlipidemia   . Hypertension   . Myocardial infarction (HCC) 1996   "light one"  . PE (pulmonary embolism) 04/2013   On chronic Xarelto  . Peripheral nerve disease   . Pneumonia "several times"  . Sleep apnea   . Type II diabetes mellitus (HCC)     Past Surgical History:  Procedure Laterality Date  . AMPUTATION Right 09/21/2017   Procedure: RIGHT GREAT TOE AMPUTATION, POSSIBLE VAC;  Surgeon: Tarry Kos, MD;  Location: [facility] OR;  Service: Orthopedics;  Laterality: Right;  . CARDIAC CATHETERIZATION  2006  . CARDIAC CATHETERIZATION  1996   "@ [facility]; when I had my heart attack"  . CARDIAC CATHETERIZATION N/A 07/29/2016   Procedure: Left Heart Cath and Coronary Angiography;  Surgeon: Runell Gess, MD;  Location: [facility] INVASIVE CV LAB;  Service: Cardiovascular;  Laterality: N/A;  . CARDIAC CATHETERIZATION N/A 07/29/2016   Procedure: Coronary Stent  Intervention;  Surgeon: Runell Gess, MD;  Location: [facility] INVASIVE CV LAB;  Service: Cardiovascular;  Laterality: N/A;  . CARPAL TUNNEL RELEASE Bilateral   . CORONARY ANGIOPLASTY WITH STENT PLACEMENT  07/29/2016  . CORONARY ARTERY BYPASS GRAFT N/A 07/24/2020   Procedure: CORONARY ARTERY BYPASS GRAFTING (CABG), ON PUMP, TIMES FOUR, USING LEFT INTERNAL MAMMARY ARTERY AND ENDOSCOPICALLY HARVESTED RIGHT GREATER SAPHENOUS VEIN;  Surgeon: Corliss Skains, MD;  Location: [facility] OR;  Service: Open Heart Surgery;  Laterality: N/A;  FLOW TAC  . CORONARY STENT INTERVENTION N/A 05/30/2019   Procedure: CORONARY STENT INTERVENTION;  Surgeon: Lyn Records, MD;  Location: [facility] INVASIVE CV LAB;  Service: Cardiovascular;  Laterality: N/A;  . CORONARY STENT INTERVENTION N/A 11/02/2019   Procedure: CORONARY STENT INTERVENTION;  Surgeon: Iran Ouch, MD;  Location: [facility] INVASIVE CV LAB;  Service: Cardiovascular;  Laterality: N/A;  . ESOPHAGOGASTRODUODENOSCOPY N/A 09/22/2017   Procedure: ESOPHAGOGASTRODUODENOSCOPY (EGD);  Surgeon: Meryl Dare, MD;  Location: [facility] ENDOSCOPY;  Service: Endoscopy;  Laterality: N/A;  . INTRAVASCULAR PRESSURE WIRE/FFR STUDY N/A 07/22/2020   Procedure: INTRAVASCULAR PRESSURE WIRE/FFR STUDY;  Surgeon: Marykay Lex, MD;  Location: [facility] INVASIVE CV LAB;  Service: Cardiovascular;  Laterality: N/A;  . KNEE ARTHROSCOPY Bilateral    "2 on left; 1 on the right"  . LEFT HEART CATH AND CORONARY ANGIOGRAPHY N/A 11/02/2019   Procedure: LEFT HEART CATH AND CORONARY ANGIOGRAPHY;  Surgeon: Kirke Corin,  Chelsea Aus, MD;  Location: [facility] INVASIVE CV LAB;  Service: Cardiovascular;  Laterality: N/A;  . LEFT HEART CATH AND CORONARY ANGIOGRAPHY N/A 07/22/2020   Procedure: LEFT HEART CATH AND CORONARY ANGIOGRAPHY;  Surgeon: Marykay Lex, MD;  Location: [facility] INVASIVE CV LAB;  Service: Cardiovascular;  Laterality: N/A;  . RIGHT/LEFT HEART CATH AND CORONARY ANGIOGRAPHY N/A 05/30/2019   Procedure: RIGHT/LEFT HEART CATH  AND CORONARY ANGIOGRAPHY;  Surgeon: Lyn Records, MD;  Location: [facility] INVASIVE CV LAB;  Service: Cardiovascular;  Laterality: N/A;  . SHOULDER OPEN ROTATOR CUFF REPAIR Bilateral   . TEE WITHOUT CARDIOVERSION N/A 07/24/2020   Procedure: TRANSESOPHAGEAL ECHOCARDIOGRAM (TEE);  Surgeon: Corliss Skains, MD;  Location: [facility] OR;  Service: Open Heart Surgery;  Laterality: N/A;    Family History  Problem Relation Age of Onset  . Hypertension Brother   . Hypertension Father   . Diabetes Other   . Hyperlipidemia Other    Social History:  reports that he quit smoking about 4 weeks ago. His smoking use included cigarettes. He has a 34.00 pack-year smoking history. He has quit using smokeless tobacco.  His smokeless tobacco use included snuff and chew. He reports current alcohol use. He reports that he does not use drugs.  Allergies:  Allergies  Allergen Reactions  . Sulfa Antibiotics Other (See Comments)    headache    No medications prior to admission.    No results found for this or any previous visit (from the past 48 hour(s)). No results found.  Review of Systems  All other systems reviewed and are negative.   There were no vitals taken for this visit. Physical Exam  Patient is alert, oriented, no adenopathy, well-dressed, normal affect, normal respiratory effort. Examination patient is ambulating wheelchair.  He has stable healing vein harvest incisions on the right lower extremity left lower extremity has significant swelling I cannot palpate a pulse the Doppler was used and he has a good biphasic dorsalis pedis and posterior tibial pulse.  Examination of the left foot he has cellulitis that is extending up the dorsum of the foot with pitting edema up to the tibial tubercle.  There is a necrotic ulcer over the dorsum of the fourth toe left foot with exposed tendon.  Ulcerative area approximately 1 x 2 cm.  Most recent hemoglobin A1c 9.4.Lungs Clear heart RRR Assessment/Plan 1. Pain in  left foot     Plan: Recommended proceeding with an amputation of the fourth ray left foot.  We will set this up for the patient next week.  Plan for outpatient surgery with regional anesthetic.   [place] Rowin Bayron, PA 08/13/2020, 6:47 AM

## 2020-08-13 NOTE — Transfer of Care (Signed)
Immediate Anesthesia Transfer of Care Note  Patient: Joseph [name]  Procedure(s) Performed: LEFT FOOT 4TH RAY AMPUTATION (Left Foot)  Patient Location: PACU  Anesthesia Type:MAC  Level of Consciousness: awake  Airway & Oxygen Therapy: Patient Spontanous Breathing  Post-op Assessment: Report given to RN and Post -op Vital signs reviewed and stable  Post vital signs: Reviewed and stable  Last Vitals:  Vitals Value Taken Time  BP    Temp    Pulse 81 08/13/20 1459  Resp 16 08/13/20 1459  SpO2 94 % 08/13/20 1459  Vitals shown include unvalidated device data.  Last Pain:  Vitals:   08/13/20 1212  TempSrc:   PainSc: 0-No pain      Patients Stated Pain Goal: 3 (16/10/96 0454)  Complications: No complications documented.

## 2020-08-13 NOTE — Anesthesia Procedure Notes (Signed)
Procedure Name: MAC Date/Time: 08/13/2020 2:32 PM Performed by: Eligha Bridegroom, CRNA Pre-anesthesia Checklist: Patient identified, Emergency Drugs available, Patient being monitored and Timeout performed Patient Re-evaluated:Patient Re-evaluated prior to induction Oxygen Delivery Method: Nasal cannula Preoxygenation: Pre-oxygenation with 100% oxygen Induction Type: IV induction

## 2020-08-13 NOTE — Progress Notes (Deleted)
Cardiology Clinic Note   Patient Name: Joseph [name] Date of Encounter: 08/13/2020  Primary Care Provider:  [name], NP Primary Cardiologist:  Skeet Latch, MD  Patient Profile    Joseph [name] 49 year old male presents to the clinic today for follow-up evaluation of his coronary artery disease.  Past Medical History    Past Medical History:  Diagnosis Date  . Anxiety   . Arthritis    "knees, shoulders, hips, ankles" (07/29/2016)  . Asthma   . CAD (coronary artery disease)    a. 2017: s/p BMS to distal Cx; b. LHC 05/30/2019: 80% mid, distal RCA s/p DES, 30% narrowing of d LM, widely patent LAD w/ luminal irregularities, widely patent stent in [place]  w/ 90+% stenosis distal to stent beofre small trifurcating obtuse marginal (potentially area of restenosis)  . Chronic bronchitis ([place])   . Chronic ulcer of right great toe ([place]) 09/19/2017  . COPD (chronic obstructive pulmonary disease) ([place])   . Depression   . DVT (deep venous thrombosis) ([place])   . GERD (gastroesophageal reflux disease)   . Hyperlipidemia   . Hypertension   . Myocardial infarction ([place]) 1996   "light one"  . PE (pulmonary embolism) 04/2013   On chronic Xarelto  . Peripheral nerve disease   . Pneumonia "several times"  . Sleep apnea   . Type II diabetes mellitus ([place])    Past Surgical History:  Procedure Laterality Date  . AMPUTATION Right 09/21/2017   Procedure: RIGHT GREAT TOE AMPUTATION, POSSIBLE VAC;  Surgeon: Leandrew Koyanagi, MD;  Location: [facility];  Service: Orthopedics;  Laterality: Right;  . CARDIAC CATHETERIZATION  2006  . CARDIAC CATHETERIZATION  1996   "@ [facility]; when I had my heart attack"  . CARDIAC CATHETERIZATION N/A 07/29/2016   Procedure: Left Heart Cath and Coronary Angiography;  Surgeon: Lorretta Harp, MD;  Location: [facility];  Service: Cardiovascular;  Laterality: N/A;  . CARDIAC CATHETERIZATION N/A 07/29/2016   Procedure: Coronary Stent Intervention;  Surgeon:  Lorretta Harp, MD;  Location: [facility];  Service: Cardiovascular;  Laterality: N/A;  . CARPAL TUNNEL RELEASE Bilateral   . CORONARY ANGIOPLASTY WITH STENT PLACEMENT  07/29/2016  . CORONARY ARTERY BYPASS GRAFT N/A 07/24/2020   Procedure: CORONARY ARTERY BYPASS GRAFTING (CABG), ON PUMP, TIMES FOUR, USING LEFT INTERNAL MAMMARY ARTERY AND ENDOSCOPICALLY HARVESTED RIGHT GREATER SAPHENOUS VEIN;  Surgeon: Lajuana Matte, MD;  Location: [facility];  Service: Open Heart Surgery;  Laterality: N/A;  FLOW TAC  . CORONARY STENT INTERVENTION N/A 05/30/2019   Procedure: CORONARY STENT INTERVENTION;  Surgeon: Belva Crome, MD;  Location: [facility];  Service: Cardiovascular;  Laterality: N/A;  . CORONARY STENT INTERVENTION N/A 11/02/2019   Procedure: CORONARY STENT INTERVENTION;  Surgeon: Wellington Hampshire, MD;  Location: [facility];  Service: Cardiovascular;  Laterality: N/A;  . ESOPHAGOGASTRODUODENOSCOPY N/A 09/22/2017   Procedure: ESOPHAGOGASTRODUODENOSCOPY (EGD);  Surgeon: Ladene Artist, MD;  Location: [facility];  Service: Endoscopy;  Laterality: N/A;  . INTRAVASCULAR PRESSURE WIRE/FFR STUDY N/A 07/22/2020   Procedure: INTRAVASCULAR PRESSURE WIRE/FFR STUDY;  Surgeon: Leonie Man, MD;  Location: [facility];  Service: Cardiovascular;  Laterality: N/A;  . KNEE ARTHROSCOPY Bilateral    "2 on left; 1 on the right"  . LEFT HEART CATH AND CORONARY ANGIOGRAPHY N/A 11/02/2019   Procedure: LEFT HEART CATH AND CORONARY ANGIOGRAPHY;  Surgeon: Wellington Hampshire, MD;  Location: [facility];  Service: Cardiovascular;  Laterality: N/A;  . LEFT HEART CATH AND CORONARY ANGIOGRAPHY N/A 07/22/2020   Procedure: LEFT HEART CATH AND CORONARY ANGIOGRAPHY;  Surgeon: Leonie Man, MD;  Location: [facility];  Service: Cardiovascular;  Laterality: N/A;  . RIGHT/LEFT HEART CATH AND CORONARY ANGIOGRAPHY N/A 05/30/2019   Procedure: RIGHT/LEFT HEART CATH AND CORONARY ANGIOGRAPHY;   Surgeon: Belva Crome, MD;  Location: [facility];  Service: Cardiovascular;  Laterality: N/A;  . SHOULDER OPEN ROTATOR CUFF REPAIR Bilateral   . TEE WITHOUT CARDIOVERSION N/A 07/24/2020   Procedure: TRANSESOPHAGEAL ECHOCARDIOGRAM (TEE);  Surgeon: Lajuana Matte, MD;  Location: [facility];  Service: Open Heart Surgery;  Laterality: N/A;    Allergies  Allergies  Allergen Reactions  . Sulfa Antibiotics Other (See Comments)    headache    History of Present Illness    ***  Home Medications    Prior to Admission medications   Medication Sig Start Date End Date Taking? Authorizing Provider  albuterol (PROAIR HFA) 108 (90 BASE) MCG/ACT inhaler Inhale 2 puffs into the lungs every 6 (six) hours as needed for wheezing or shortness of breath.     [provider]  ALPRAZolam Duanne Moron) 1 MG tablet Take 1-2 mg by mouth 4 (four) times daily as needed (anxiety).     [provider]  aspirin 325 MG EC tablet Take 1 tablet (325 mg total) by mouth daily. 07/31/20   Elgie Collard, PA-C  atorvastatin (LIPITOR) 80 MG tablet Take 1 tablet (80 mg total) by mouth daily at 6 PM. 05/31/19   Sande Rives E, PA-C  blood glucose meter kit and supplies KIT Dispense based on patient and insurance preference. Use up to four times daily as directed. (FOR ICD-9 250.00, 250.01). 09/23/17   Hongalgi, Lenis Dickinson, MD  buPROPion (WELLBUTRIN SR) 150 MG 12 hr tablet Take 150 mg by mouth 2 (two) times daily.  08/17/16   [provider]  carvedilol (COREG) 3.125 MG tablet Take 1 tablet (3.125 mg total) by mouth 2 (two) times daily. 07/31/20   Elgie Collard, PA-C  diphenhydrAMINE (BENADRYL) 25 mg capsule Take 50 mg by mouth every 6 (six) hours as needed for allergies.    [provider]  escitalopram (LEXAPRO) 10 MG tablet Take 10 mg by mouth daily. 06/10/20   [provider]  fenofibrate (TRICOR) 145 MG tablet Take 145 mg by mouth daily.     [provider]  fluconazole  (DIFLUCAN) 100 MG tablet Take 100 mg by mouth every Monday.    [provider]  fluticasone (FLONASE) 50 MCG/ACT nasal spray Place 2 sprays into both nostrils daily as needed for allergies. 03/14/19   [provider]  HUMALOG KWIKPEN 100 UNIT/ML SOPN Inject 15-40 Units into the skin 3 (three) times daily with meals. sliding scale 05/02/13   [provider]  LANTUS SOLOSTAR 100 UNIT/ML Solostar Pen Inject 40 Units into the skin 2 (two) times daily. 07/31/20   Elgie Collard, PA-C  meclizine (ANTIVERT) 25 MG tablet Take 25-50 mg by mouth See admin instructions. Take 2 tablets (50 mg) by mouth in the morning & take 1 tablet (25 mg) by mouth at night. 04/10/13   [provider]  metFORMIN (GLUCOPHAGE) 1000 MG tablet Take 1 tablet (1,000 mg total) by mouth 2 (two) times daily with a meal. 11/05/19   Isaiah Serge, NP  midodrine (PROAMATINE) 5 MG tablet Take 1 tablet (5 mg total) by mouth  3 (three) times daily with meals. 07/31/20   Elgie Collard, PA-C  oxyCODONE (ROXICODONE) 15 MG immediate release tablet Take 15 mg by mouth 5 (five) times daily.     [provider]  pantoprazole (PROTONIX) 40 MG tablet Take 1 tablet (40 mg total) by mouth daily. Patient taking differently: Take 80 mg by mouth daily.  09/23/17   Hongalgi, Lenis Dickinson, MD  pregabalin (LYRICA) 150 MG capsule Take 150 mg by mouth 3 (three) times daily.  05/06/19   [provider]  VICTOZA 18 MG/3ML SOPN Inject 1.2 mg into the skin at bedtime. 04/30/19   [provider]    Family History    Family History  Problem Relation Age of Onset  . Hypertension Brother   . Hypertension Father   . Diabetes Other   . Hyperlipidemia Other    He indicated that his mother is alive. He indicated that his father is alive. He indicated that his brother is alive. He indicated that the status of his other is unknown.  Social History    Social History   Socioeconomic History  . Marital status:  Married    Spouse name: Not on file  . Number of children: Not on file  . Years of education: Not on file  . Highest education level: Not on file  Occupational History  . Occupation: disabled     Fish farm manager: UNEMPLOYED  Tobacco Use  . Smoking status: Former Smoker    Packs/day: 1.00    Years: 34.00    Pack years: 34.00    Types: Cigarettes    Quit date: 07/12/2020    Years since quitting: 0.0  . Smokeless tobacco: Former Systems developer    Types: Snuff, Chew  Vaping Use  . Vaping Use: Never used  Substance and Sexual Activity  . Alcohol use: Yes    Comment: RARE  . Drug use: No  . Sexual activity: Yes  Other Topics Concern  . Not on file  Social History Narrative  . Not on file   Social Determinants of Health   Financial Resource Strain:   . Difficulty of Paying Living Expenses: Not on file  Food Insecurity:   . Worried About Charity fundraiser in the Last Year: Not on file  . Ran Out of Food in the Last Year: Not on file  Transportation Needs:   . Lack of Transportation (Medical): Not on file  . Lack of Transportation (Non-Medical): Not on file  Physical Activity:   . Days of Exercise per Week: Not on file  . Minutes of Exercise per Session: Not on file  Stress:   . Feeling of Stress : Not on file  Social Connections:   . Frequency of Communication with Friends and Family: Not on file  . Frequency of Social Gatherings with Friends and Family: Not on file  . Attends Religious Services: Not on file  . Active Member of Clubs or Organizations: Not on file  . Attends Archivist Meetings: Not on file  . Marital Status: Not on file  Intimate Partner Violence:   . Fear of Current or Ex-Partner: Not on file  . Emotionally Abused: Not on file  . Physically Abused: Not on file  . Sexually Abused: Not on file     Review of Systems    General:  No chills, fever, night sweats or weight changes.  Cardiovascular:  No chest pain, dyspnea on exertion, edema, orthopnea,  palpitations, paroxysmal nocturnal dyspnea. Dermatological: No  rash, lesions/masses Respiratory: No cough, dyspnea Urologic: No hematuria, dysuria Abdominal:   No nausea, vomiting, diarrhea, bright red blood per rectum, melena, or hematemesis Neurologic:  No visual changes, wkns, changes in mental status. All other systems reviewed and are otherwise negative except as noted above.  Physical Exam    VS:  There were no vitals taken for this visit. , BMI There is no height or weight on file to calculate BMI. GEN: Well nourished, well developed, in no acute distress. HEENT: normal. Neck: Supple, no JVD, carotid bruits, or masses. Cardiac: RRR, no murmurs, rubs, or gallops. No clubbing, cyanosis, edema.  Radials/DP/PT 2+ and equal bilaterally.  Respiratory:  Respirations regular and unlabored, clear to auscultation bilaterally. GI: Soft, nontender, nondistended, BS + x 4. MS: no deformity or atrophy. Skin: warm and dry, no rash. Neuro:  Strength and sensation are intact. Psych: Normal affect.  Accessory Clinical Findings    Recent Labs: 07/25/2020: Magnesium 2.0 07/27/2020: ALT 73; B Natriuretic Peptide 271.0 07/29/2020: BUN 37; Creatinine, Ser 1.40; Hemoglobin 7.6; Platelets 303; Potassium 4.9; Sodium 137   Recent Lipid Panel    Component Value Date/Time   CHOL 158 07/19/2020 0459   TRIG 187 (H) 07/19/2020 0459   HDL 20 (L) 07/19/2020 0459   CHOLHDL 7.9 07/19/2020 0459   VLDL 37 07/19/2020 0459   LDLCALC 101 (H) 07/19/2020 0459   LDLDIRECT 120.0 05/28/2015 0951    ECG personally reviewed by me today- *** - No acute changes  Assessment & Plan   1.  ***   Jossie Ng. Niamya Vittitow NP-C    08/13/2020, 8:50 AM [address] Office [phone] Fax [phone]  Notice: This dictation was prepared with Dragon dictation along with smaller phrase technology. Any transcriptional errors that result from this process are unintentional and  may not be corrected upon review.

## 2020-08-13 NOTE — Anesthesia Postprocedure Evaluation (Signed)
Anesthesia Post Note  Patient: Joseph [name]  Procedure(s) Performed: LEFT FOOT 4TH RAY AMPUTATION (Left Foot)     Patient location during evaluation: PACU Anesthesia Type: Regional and MAC Level of consciousness: awake and alert Pain management: pain level controlled Vital Signs Assessment: post-procedure vital signs reviewed and stable Respiratory status: spontaneous breathing, nonlabored ventilation and respiratory function stable Cardiovascular status: blood pressure returned to baseline and stable Postop Assessment: no apparent nausea or vomiting Anesthetic complications: no   No complications documented.  Last Vitals:  Vitals:   08/13/20 1545 08/13/20 1600  BP: 113/68   Pulse:    Resp: 15 17  Temp:  36.6 C  SpO2: 100% 100%    Last Pain:  Vitals:   08/13/20 1600  TempSrc:   PainSc: 0-No pain                 Pervis Hocking

## 2020-08-13 NOTE — Anesthesia Preprocedure Evaluation (Addendum)
Anesthesia Evaluation  Patient identified by MRN, date of birth, ID band Patient awake    Reviewed: Allergy & Precautions, NPO status , Patient's Chart, lab work & pertinent test results  Airway Mallampati: II  TM Distance: >3 FB     Dental   Pulmonary asthma , sleep apnea , pneumonia, COPD, Current Smoker, former smoker,    breath sounds clear to auscultation       Cardiovascular hypertension, Pt. on medications + angina + CAD, + Past MI and + CABG   Rhythm:Regular Rate:Normal     Neuro/Psych PSYCHIATRIC DISORDERS Anxiety Depression  Neuromuscular disease    GI/Hepatic Neg liver ROS, GERD  ,  Endo/Other  diabetesMorbid obesity  Renal/GU Renal disease     Musculoskeletal  (+) Arthritis ,   Abdominal   Peds  Hematology   Anesthesia Other Findings   Reproductive/Obstetrics                             Anesthesia Physical  Anesthesia Plan  ASA: III  Anesthesia Plan: Regional   Post-op Pain Management:    Induction: Intravenous  PONV Risk Score and Plan: 2 and Ondansetron, Propofol infusion and Treatment may vary due to age or medical condition  Airway Management Planned: Natural Airway and Simple Face Mask  Additional Equipment:   Intra-op Plan:   Post-operative Plan:   Informed Consent: I have reviewed the patients History and Physical, chart, labs and discussed the procedure including the risks, benefits and alternatives for the proposed anesthesia with the patient or authorized representative who has indicated his/her understanding and acceptance.       Plan Discussed with: CRNA  Anesthesia Plan Comments:        Anesthesia Quick Evaluation

## 2020-08-13 NOTE — Progress Notes (Signed)
Orthopedic Tech Progress Note Patient Details:  Joseph [name] Nov 30, 1971 [id number] PACU RN called requesting a CAM WALKER BOOT for patient Ortho Devices Type of Ortho Device: CAM walker Ortho Device/Splint Location: LLE Ortho Device/Splint Interventions: Ordered, Application   Post Interventions Patient Tolerated: Well Instructions Provided: Care of device   [name] 08/13/2020, 4:40 PM

## 2020-08-13 NOTE — Anesthesia Procedure Notes (Addendum)
Anesthesia Regional Block: Popliteal block   Pre-Anesthetic Checklist: ,, timeout performed, Correct Patient, Correct Site, Correct Laterality, Correct Procedure, Correct Position, site marked, Risks and benefits discussed,  Surgical consent,  Pre-op evaluation,  At surgeon's request and post-op pain management  Laterality: Left  Prep: chloraprep       Needles:  Injection technique: Single-shot  Needle Type: Echogenic Needle     Needle Length: 9cm  Needle Gauge: 21     Additional Needles:   Procedures:,,,, ultrasound used (permanent image in chart),,,,   Nerve Stimulator or Paresthesia:  Response: plantarflexion, 0.5 mA,   Additional Responses:   Narrative:  Start time: 08/13/2020 1:12 PM End time: 08/13/2020 1:19 PM Injection made incrementally with aspirations every 5 mL.  Performed by: Personally  Anesthesiologist: Marcene Duos, MD

## 2020-08-13 NOTE — Progress Notes (Signed)
Made dr Sampson Goon aware of 6.1 K

## 2020-08-14 ENCOUNTER — Ambulatory Visit: Payer: Medicare Other | Admitting: General Practice

## 2020-08-14 ENCOUNTER — Encounter (HOSPITAL_COMMUNITY): Payer: Self-pay | Admitting: Orthopedic Surgery

## 2020-08-14 NOTE — Addendum Note (Signed)
Addendum  created 08/14/20 1458 by Marcene Duos, MD   Clinical Note Signed, Intraprocedure Blocks edited

## 2020-08-16 ENCOUNTER — Other Ambulatory Visit: Payer: Self-pay | Admitting: Cardiology

## 2020-08-18 ENCOUNTER — Other Ambulatory Visit: Payer: Self-pay | Admitting: Cardiology

## 2020-08-18 NOTE — Progress Notes (Deleted)
Cardiology Clinic Note   Patient Name: Joseph [name] Date of Encounter: 08/18/2020  Primary Care Provider:  [name], NP Primary Cardiologist:  Skeet Latch, MD  Patient Profile    Joseph [name] 49 year old male presents to the clinic today for follow-up evaluation of his coronary artery disease.  Past Medical History    Past Medical History:  Diagnosis Date  . Anxiety   . Arthritis    "knees, shoulders, hips, ankles" (07/29/2016)  . Asthma   . CAD (coronary artery disease)    a. 2017: s/p BMS to distal Cx; b. LHC 05/30/2019: 80% mid, distal RCA s/p DES, 30% narrowing of d LM, widely patent LAD w/ luminal irregularities, widely patent stent in [place]  w/ 90+% stenosis distal to stent beofre small trifurcating obtuse marginal (potentially area of restenosis)  . Chronic bronchitis ([place])   . Chronic ulcer of right great toe ([place]) 09/19/2017  . COPD (chronic obstructive pulmonary disease) ([place])   . Depression   . DVT (deep venous thrombosis) ([place])   . GERD (gastroesophageal reflux disease)   . Hyperlipidemia   . Hypertension   . Myocardial infarction ([place]) 1996   "light one"  . PE (pulmonary embolism) 04/2013   On chronic Xarelto  . Peripheral nerve disease   . Pneumonia "several times"  . Sleep apnea   . Type II diabetes mellitus ([place])    Past Surgical History:  Procedure Laterality Date  . AMPUTATION Right 09/21/2017   Procedure: RIGHT GREAT TOE AMPUTATION, POSSIBLE VAC;  Surgeon: Leandrew Koyanagi, MD;  Location: [place];  Service: Orthopedics;  Laterality: Right;  . AMPUTATION Left 08/13/2020   Procedure: LEFT FOOT 4TH RAY AMPUTATION;  Surgeon: Newt Minion, MD;  Location: [place];  Service: Orthopedics;  Laterality: Left;  . CARDIAC CATHETERIZATION  2006  . CARDIAC CATHETERIZATION  1996   "@ [facility]; when I had my heart attack"  . CARDIAC CATHETERIZATION N/A 07/29/2016   Procedure: Left Heart Cath and Coronary Angiography;  Surgeon: Lorretta Harp, MD;   Location: [place] [facility];  Service: Cardiovascular;  Laterality: N/A;  . CARDIAC CATHETERIZATION N/A 07/29/2016   Procedure: Coronary Stent Intervention;  Surgeon: Lorretta Harp, MD;  Location: [place] [facility];  Service: Cardiovascular;  Laterality: N/A;  . CARPAL TUNNEL RELEASE Bilateral   . CORONARY ANGIOPLASTY WITH STENT PLACEMENT  07/29/2016  . CORONARY ARTERY BYPASS GRAFT N/A 07/24/2020   Procedure: CORONARY ARTERY BYPASS GRAFTING (CABG), ON PUMP, TIMES FOUR, USING LEFT INTERNAL MAMMARY ARTERY AND ENDOSCOPICALLY HARVESTED RIGHT GREATER SAPHENOUS VEIN;  Surgeon: Lajuana Matte, MD;  Location: [place];  Service: Open Heart Surgery;  Laterality: N/A;  FLOW TAC  . CORONARY STENT INTERVENTION N/A 05/30/2019   Procedure: CORONARY STENT INTERVENTION;  Surgeon: Belva Crome, MD;  Location: [place] [facility];  Service: Cardiovascular;  Laterality: N/A;  . CORONARY STENT INTERVENTION N/A 11/02/2019   Procedure: CORONARY STENT INTERVENTION;  Surgeon: Wellington Hampshire, MD;  Location: [place] [facility];  Service: Cardiovascular;  Laterality: N/A;  . ESOPHAGOGASTRODUODENOSCOPY N/A 09/22/2017   Procedure: ESOPHAGOGASTRODUODENOSCOPY (EGD);  Surgeon: Ladene Artist, MD;  Location: [facility];  Service: Endoscopy;  Laterality: N/A;  . INTRAVASCULAR PRESSURE WIRE/FFR STUDY N/A 07/22/2020   Procedure: INTRAVASCULAR PRESSURE WIRE/FFR STUDY;  Surgeon: Leonie Man, MD;  Location: [place] [facility];  Service: Cardiovascular;  Laterality: N/A;  . KNEE ARTHROSCOPY Bilateral    "2 on left; 1 on the right"  .  LEFT HEART CATH AND CORONARY ANGIOGRAPHY N/A 11/02/2019   Procedure: LEFT HEART CATH AND CORONARY ANGIOGRAPHY;  Surgeon: Wellington Hampshire, MD;  Location: [place] [facility];  Service: Cardiovascular;  Laterality: N/A;  . LEFT HEART CATH AND CORONARY ANGIOGRAPHY N/A 07/22/2020   Procedure: LEFT HEART CATH AND CORONARY ANGIOGRAPHY;  Surgeon: Leonie Man, MD;  Location: [place] [facility];   Service: Cardiovascular;  Laterality: N/A;  . RIGHT/LEFT HEART CATH AND CORONARY ANGIOGRAPHY N/A 05/30/2019   Procedure: RIGHT/LEFT HEART CATH AND CORONARY ANGIOGRAPHY;  Surgeon: Belva Crome, MD;  Location: [place] [facility];  Service: Cardiovascular;  Laterality: N/A;  . SHOULDER OPEN ROTATOR CUFF REPAIR Bilateral   . TEE WITHOUT CARDIOVERSION N/A 07/24/2020   Procedure: TRANSESOPHAGEAL ECHOCARDIOGRAM (TEE);  Surgeon: Lajuana Matte, MD;  Location: [place];  Service: Open Heart Surgery;  Laterality: N/A;    Allergies  Allergies  Allergen Reactions  . Sulfa Antibiotics Other (See Comments)    headache    History of Present Illness    Joseph [name] has a PMH of coronary artery disease (status post bare-metal stent to LCx in 2017, DES to distal RCA 6/20, repeat cardiac catheterization 11/20 with DES to distal LCx with patent stents in RCA and LAD LCx), DVT/PE on Xarelto, hypertension, hyperlipidemia, and tobacco use.  He underwent cardiac catheterization on 07/22/2020 which showed elevated LVEDP, 70% ostial LAD 55% side branch CX, 60% mid CX, 50% first marginal, mid CX overlapping bare-metal stent and DES stent 5% stenosed, first diagonal 60% stenosis, third diagonal 60% stenosed, proximal-mid RCA stented segment 25% stenosed, distal RCA 55% stenosed with 65% stenosed side branch of RPDA.  CVTS consultation was recommended.  Echocardiogram 07/18/2020 showed an LVEF of 50-55% and no significant valvular abnormalities.  He was seen for CVTS evaluation on 08/08/2020 by Dr. Kipp Brood.  At that time it was planned for left fourth toe amputation.  Dr. Kipp Brood recommended increase p.o. hydration and follow-up in 1 month with CXR.  On 08/13/2020 he underwent left foot fourth metatarsal amputation for osteomyelitis.  He presents to the clinic today for follow-up evaluation and states***  *** denies chest pain, shortness of breath, lower extremity edema, fatigue, palpitations, melena, hematuria,  hemoptysis, diaphoresis, weakness, presyncope, syncope, orthopnea, and PND.   Home Medications    Prior to Admission medications   Medication Sig Start Date End Date Taking? Authorizing Provider  albuterol (PROAIR HFA) 108 (90 BASE) MCG/ACT inhaler Inhale 2 puffs into the lungs every 6 (six) hours as needed for wheezing or shortness of breath.     [provider]  ALPRAZolam Duanne Moron) 1 MG tablet Take 1-2 mg by mouth 4 (four) times daily as needed (anxiety).     [provider]  aspirin 325 MG EC tablet Take 1 tablet (325 mg total) by mouth daily. 07/31/20   Elgie Collard, PA-C  atorvastatin (LIPITOR) 80 MG tablet Take 1 tablet (80 mg total) by mouth daily at 6 PM. 05/31/19   Sande Rives E, PA-C  blood glucose meter kit and supplies KIT Dispense based on patient and insurance preference. Use up to four times daily as directed. (FOR ICD-9 250.00, 250.01). 09/23/17   Hongalgi, Lenis Dickinson, MD  buPROPion (WELLBUTRIN SR) 150 MG 12 hr tablet Take 150 mg by mouth 2 (two) times daily.  08/17/16   [provider]  carvedilol (COREG) 3.125 MG tablet Take 1 tablet (3.125 mg total) by mouth 2 (two) times daily. 07/31/20   Nicholes Rough  N, PA-C  diphenhydrAMINE (BENADRYL) 25 mg capsule Take 50 mg by mouth every 6 (six) hours as needed for allergies.    [provider]  escitalopram (LEXAPRO) 10 MG tablet Take 10 mg by mouth daily. 06/10/20   [provider]  fenofibrate (TRICOR) 145 MG tablet Take 145 mg by mouth daily.     [provider]  fluconazole (DIFLUCAN) 100 MG tablet Take 100 mg by mouth every Monday.    [provider]  fluticasone (FLONASE) 50 MCG/ACT nasal spray Place 2 sprays into both nostrils daily as needed for allergies. 03/14/19   [provider]  HUMALOG KWIKPEN 100 UNIT/ML SOPN Inject 15-40 Units into the skin 3 (three) times daily with meals. sliding scale 05/02/13   [provider]  LANTUS SOLOSTAR 100 UNIT/ML  Solostar Pen Inject 40 Units into the skin 2 (two) times daily. 07/31/20   Elgie Collard, PA-C  meclizine (ANTIVERT) 25 MG tablet Take 25-50 mg by mouth See admin instructions. Take 2 tablets (50 mg) by mouth in the morning & take 1 tablet (25 mg) by mouth at night. 04/10/13   [provider]  metFORMIN (GLUCOPHAGE) 1000 MG tablet Take 1 tablet (1,000 mg total) by mouth 2 (two) times daily with a meal. 11/05/19   Isaiah Serge, NP  midodrine (PROAMATINE) 5 MG tablet Take 1 tablet (5 mg total) by mouth 3 (three) times daily with meals. 07/31/20   Elgie Collard, PA-C  oxyCODONE (ROXICODONE) 15 MG immediate release tablet Take 15 mg by mouth 5 (five) times daily.     [provider]  pantoprazole (PROTONIX) 40 MG tablet Take 1 tablet (40 mg total) by mouth daily. Patient taking differently: Take 80 mg by mouth daily.  09/23/17   Hongalgi, Lenis Dickinson, MD  pregabalin (LYRICA) 150 MG capsule Take 150 mg by mouth 3 (three) times daily.  05/06/19   [provider]  VICTOZA 18 MG/3ML SOPN Inject 1.2 mg into the skin at bedtime. 04/30/19   [provider]    Family History    Family History  Problem Relation Age of Onset  . Hypertension Brother   . Hypertension Father   . Diabetes Other   . Hyperlipidemia Other    He indicated that his mother is alive. He indicated that his father is alive. He indicated that his brother is alive. He indicated that the status of his other is unknown.  Social History    Social History   Socioeconomic History  . Marital status: Married    Spouse name: Not on file  . Number of children: Not on file  . Years of education: Not on file  . Highest education level: Not on file  Occupational History  . Occupation: disabled     Fish farm manager: UNEMPLOYED  Tobacco Use  . Smoking status: Former Smoker    Packs/day: 1.00    Years: 34.00    Pack years: 34.00    Types: Cigarettes    Quit date: 07/12/2020    Years since quitting: 0.1  .  Smokeless tobacco: Former Systems developer    Types: Snuff, Chew  Vaping Use  . Vaping Use: Never used  Substance and Sexual Activity  . Alcohol use: Yes    Comment: RARE  . Drug use: No  . Sexual activity: Yes  Other Topics Concern  . Not on file  Social History Narrative  . Not on file   Social Determinants of Health   Financial Resource Strain:   .  Difficulty of Paying Living Expenses: Not on file  Food Insecurity:   . Worried About Charity fundraiser in the Last Year: Not on file  . Ran Out of Food in the Last Year: Not on file  Transportation Needs:   . Lack of Transportation (Medical): Not on file  . Lack of Transportation (Non-Medical): Not on file  Physical Activity:   . Days of Exercise per Week: Not on file  . Minutes of Exercise per Session: Not on file  Stress:   . Feeling of Stress : Not on file  Social Connections:   . Frequency of Communication with Friends and Family: Not on file  . Frequency of Social Gatherings with Friends and Family: Not on file  . Attends Religious Services: Not on file  . Active Member of Clubs or Organizations: Not on file  . Attends Archivist Meetings: Not on file  . Marital Status: Not on file  Intimate Partner Violence:   . Fear of Current or Ex-Partner: Not on file  . Emotionally Abused: Not on file  . Physically Abused: Not on file  . Sexually Abused: Not on file     Review of Systems    General:  No chills, fever, night sweats or weight changes.  Cardiovascular:  No chest pain, dyspnea on exertion, edema, orthopnea, palpitations, paroxysmal nocturnal dyspnea. Dermatological: No rash, lesions/masses Respiratory: No cough, dyspnea Urologic: No hematuria, dysuria Abdominal:   No nausea, vomiting, diarrhea, bright red blood per rectum, melena, or hematemesis Neurologic:  No visual changes, wkns, changes in mental status. All other systems reviewed and are otherwise negative except as noted above.  Physical Exam    VS:   There were no vitals taken for this visit. , BMI There is no height or weight on file to calculate BMI. GEN: Well nourished, well developed, in no acute distress. HEENT: normal. Neck: Supple, no JVD, carotid bruits, or masses. Cardiac: RRR, no murmurs, rubs, or gallops. No clubbing, cyanosis, edema.  Radials/DP/PT 2+ and equal bilaterally.  Respiratory:  Respirations regular and unlabored, clear to auscultation bilaterally. GI: Soft, nontender, nondistended, BS + x 4. MS: no deformity or atrophy. Skin: warm and dry, no rash. Neuro:  Strength and sensation are intact. Psych: Normal affect.  Accessory Clinical Findings    Recent Labs: 07/25/2020: Magnesium 2.0 07/27/2020: ALT 73; B Natriuretic Peptide 271.0 08/13/2020: BUN 31; Creatinine, Ser 1.80; Hemoglobin 8.8; Platelets 266; Potassium 4.8; Sodium 143   Recent Lipid Panel    Component Value Date/Time   CHOL 158 07/19/2020 0459   TRIG 187 (H) 07/19/2020 0459   HDL 20 (L) 07/19/2020 0459   CHOLHDL 7.9 07/19/2020 0459   VLDL 37 07/19/2020 0459   LDLCALC 101 (H) 07/19/2020 0459   LDLDIRECT 120.0 05/28/2015 0951    ECG personally reviewed by me today- *** - No acute changes  Cardiac catheterization 5/0/5397  LV end diastolic pressure is severely elevated -consistent with ACUTE DIASTOLIC HEART FAILURE  Mid LM to Ost LAD lesion is 70% stenosed with 55% stenosed side branch in Ost Cx to Mid Cx.  Mid Cx-1 lesion is 60% stenosed with 50% stenosed side branch in 1st Mrg.  Mid Cx-2 overlapping BMS and DES stent segment is 5% stenosed.  1st Diag lesion is 60% stenosed. 3rd Diag lesion is 60% stenosed.  Prox RCA to Mid RCA stented segment is 25% stenosed.  Dist RCA lesion is 50% stenosed with 65% stenosed side branch in RPDA.   -------------------------------------------- [facility]  Severe distal LEFT MAIN 70% stenosis (highly positive DFR 0.85 and proximal LAD) ? Relatively normal LAD with minimal disease, 1st & 3rd Diag having  proximal 50 to 60% stenosis. ? Diffuse 55% calcified proximal to mid LCx with a focal 60% stenosis just prior to overlapping VISION BMS, and RESOLUTE ONYX DES in the main LCx prior to bifurcation.  Minimal RCA disease with roughly 20% ISR in the stented segment of the RCA.  Ostial PDA 50-60%  LV gram not performed due to severely elevated LVEDP of 33 mmHg -> consistent with ACUTE DIASTOLIC HEART FAILURE (will need echocardiogram to assess EF)   RECOMMENDATIONS:  With severely LM and proximal LCx disease, will refer for CVTS consultation. -->  Plavix has been discontinued  He would likely need several days for Plavix washout -> he will also need aggressive blood pressure management along with diuresis. ? I have ordered Lasix 40 mg IV twice daily starting this evening.  Restart IV heparin 8 hours after sheath removal.  Continue aggressive CRF Guideline Directed Therapy.  Diagnostic Dominance: Right  Intervention   Echocardiogram 07/18/2020  IMPRESSIONS    1. Left ventricular ejection fraction, by estimation, is 50 to 55%. The  left ventricle has low normal function. Left ventricular endocardial  border not optimally defined to evaluate regional wall motion. There is  mild left ventricular hypertrophy. Left  ventricular diastolic parameters were normal.  2. Right ventricular systolic function is normal. The right ventricular  size is normal. Tricuspid regurgitation signal is inadequate for assessing  PA pressure.  3. The mitral valve is grossly normal. No evidence of mitral valve  regurgitation.  4. The aortic valve was not well visualized. Aortic valve regurgitation  is not visualized.  5. The inferior vena cava is normal in size with greater than 50%  respiratory variability, suggesting right atrial pressure of 3 mmHg.  Assessment & Plan   1.  Coronary artery disease-continues with intermittent chest discomfort, noted with increased physical activity.  Cardiac  catheterization 07/22/2020 showed multivessel disease.  Underwent consultation with Dr. Kipp Brood who recommended following up status post left fourth toe amputation. Follow-up with CVTS. Continue carvedilol, atorvastatin, Victoza Heart healthy low-sodium diet-salty 6 given  Sepsis-osteomyelitis related to left fourth toe.  Status post amputation on 08/13/2020.  Healing well. Follow-up with orthopedic surgery as scheduled Continue to monitor for fever, drainage, lethargy, erythema/red streaking.  Hyperlipidemia-07/19/2020: Cholesterol 158; HDL 20; LDL Cholesterol 101; Triglycerides 187; VLDL 37 Continue atorvastatin, fenofibrate Heart healthy low-sodium high-fiber diet Increase physical activity as tolerated  Diabetes mellitus-blood glucose 08/13/2020 139 Continue Victoza, Metformin, Lantus Heart healthy low-sodium diet-salty 6 given Increase physical activity as tolerated Followed by PCP  History of PE/DVT-no increased DOE or shortness of breath today.  No claudication. Continue aspirin, Discussed signs and symptoms of PE/DVT Continue to monitor  Disposition: Follow-up with Dr. Oval Linsey in 3 months.   Joseph Ng. Jacqueline Delapena NP-C    08/18/2020, 1:56 PM [facility] [place] [address] Office [phone] Fax [phone]  Notice: This dictation was prepared with Dragon dictation along with smaller phrase technology. Any transcriptional errors that result from this process are unintentional and may not be corrected upon review.

## 2020-08-19 ENCOUNTER — Ambulatory Visit: Payer: Medicare Other | Admitting: General Practice

## 2020-08-21 ENCOUNTER — Ambulatory Visit (INDEPENDENT_AMBULATORY_CARE_PROVIDER_SITE_OTHER): Payer: Medicare Other | Admitting: Physician Assistant

## 2020-08-21 ENCOUNTER — Encounter: Payer: Self-pay | Admitting: Orthopedic Surgery

## 2020-08-21 DIAGNOSIS — M79672 Pain in left foot: Secondary | ICD-10-CM

## 2020-08-21 NOTE — Progress Notes (Signed)
Office Visit Note   Patient: Joseph [name]           Date of Birth: 1971-07-25           MRN: [id number] Visit Date: 08/21/2020              Requested by: Marva Panda, NP [address] [place],  [name] [address] PCP: Marva Panda, NP  Chief Complaint  Patient presents with  . Left Foot - Routine Post Op    08/13/20 4th ray amputation       HPI: Patient is 1 week status post left foot fourth ray amputation.  Overall he is doing well.  He is having to do some walking on his heel in a cam boot to satisfy his cardiac rehab as he recently had cardiac surgery  Assessment & Plan: Visit Diagnoses: No diagnosis found.  Plan: They will begin daily cleansing with mild soap such as Dial antibacterial soap should dry the area good in place a new dressing.  Try to elevate as much as possible will follow up in 1 week  Follow-Up Instructions: No follow-ups on file.   Ortho Exam  Patient is alert, oriented, no adenopathy, well-dressed, normal affect, normal respiratory effort. Examination of the left foot demonstrates healing surgical incision.  Well apposed wound edges.  He does have some maceration in the web space.  No necrosis swelling is overall well controlled no signs of cellulitis  Imaging: No results found. No images are attached to the encounter.  Labs: Lab Results  Component Value Date   HGBA1C 9.4 (H) 07/18/2020   HGBA1C 8.2 (H) 11/02/2019   HGBA1C 8.2 (H) 11/01/2019   ESRSEDRATE 41 (H) 09/16/2017   CRP 8.9 (H) 09/16/2017   REPTSTATUS 07/23/2020 FINAL 07/18/2020   REPTSTATUS 07/23/2020 FINAL 07/18/2020   CULT  07/18/2020    NO GROWTH 5 DAYS Performed at [facility], [address], [place], [name] [address]    CULT  07/18/2020    NO GROWTH 5 DAYS Performed at [facility], [address] [place], [name] [address]      Lab Results  Component Value Date   ALBUMIN 2.9 (L) 07/27/2020   ALBUMIN 2.9 (L) 07/26/2020   ALBUMIN 3.0 (L) 07/21/2020      Lab Results  Component Value Date   MG 2.0 07/25/2020   MG 2.1 07/25/2020   MG 2.3 07/24/2020   No results found for: VD25OH  No results found for: PREALBUMIN CBC EXTENDED Latest Ref Rng & Units 08/13/2020 08/13/2020 07/29/2020  WBC 4.0 - 10.5 K/uL - 7.5 12.4(H)  RBC 4.22 - 5.81 MIL/uL - 3.23(L) 2.62(L)  HGB 13.0 - 17.0 g/dL 5.8(N) 8.9(L) 7.6(L)  HCT 39 - 52 % 26.0(L) 30.8(L) 24.6(L)  PLT 150 - 400 K/uL - 266 303  NEUTROABS 1.7 - 7.7 K/uL - - -  LYMPHSABS 0.7 - 4.0 K/uL - - -     There is no height or weight on file to calculate BMI.  Orders:  No orders of the defined types were placed in this encounter.  No orders of the defined types were placed in this encounter.    Procedures: No procedures performed  Clinical Data: No additional findings.  ROS:  All other systems negative, except as noted in the HPI. Review of Systems  Objective: Vital Signs: There were no vitals taken for this visit.  Specialty Comments:  No specialty comments available.  PMFS History: Patient Active Problem List   Diagnosis Date  Noted  . Osteomyelitis of fourth toe of right foot (HCC)   . S/P CABG x 4 07/24/2020  . Non-ST elevation (NSTEMI) myocardial infarction (HCC) 07/22/2020  . Acute on chronic diastolic heart failure (HCC) 07/22/2020  . Hypotension 07/18/2020  . Left leg swelling 07/18/2020  . Elevated troponin 07/18/2020  . AKI (acute kidney injury) (HCC) 07/18/2020  . Elevated brain natriuretic peptide (BNP) level 07/18/2020  . GERD (gastroesophageal reflux disease) 07/18/2020  . Severe Sepsis secondary to Lt leg/Foot Cellulitis 07/18/2020  . Severe sepsis --Severe Sepsis secondary to Lt leg/Foot Cellulitis with hypotension and AKI 07/18/2020  . Unstable angina (HCC) 11/01/2019  . Edema 06/13/2019  . Leukocytosis 05/31/2019  . Smoker 03/08/2019  . Chronic pain 03/08/2019  . Chronic back pain 02/16/2019  . Deep venous thrombosis (HCC) 02/16/2019  . Obesity, Class III,  BMI 40-49.9 (morbid obesity) (HCC) 02/16/2019  . Peripheral nerve disease 02/16/2019  . COPD (chronic obstructive pulmonary disease) (HCC) 01/22/2019  . Anxiety disorder 03/26/2018  . Open wound of toe 10/12/2017  . Sinusitis 10/12/2017  . Abdominal pain   . Loss of weight   . Diabetic foot infection (HCC) 09/16/2017  . Uncontrolled type 2 diabetes mellitus with hyperglycemia, with long-term current use of insulin (HCC) 09/16/2017  . Cellulitis of right foot   . Chronic ulcer of great toe of right foot (HCC) 09/09/2017  . Recurrent chest pain 07/29/2016  . Hyperglycemia   . Insulin dependent type 2 diabetes mellitus (HCC) 04/29/2015  . OSA (obstructive sleep apnea) 05/08/2013  . History of pulmonary embolus (PE) 04/27/2013  . CAD -S/P PCI 11/02/19  09/10/2008  . Dyslipidemia 09/09/2008   Past Medical History:  Diagnosis Date  . Anxiety   . Arthritis    "knees, shoulders, hips, ankles" (07/29/2016)  . Asthma   . CAD (coronary artery disease)    a. 2017: s/p BMS to distal Cx; b. LHC 05/30/2019: 80% mid, distal RCA s/p DES, 30% narrowing of d LM, widely patent LAD w/ luminal irregularities, widely patent stent in dCX  w/ 90+% stenosis distal to stent beofre small trifurcating obtuse marginal (potentially area of restenosis)  . Chronic bronchitis (HCC)   . Chronic ulcer of right great toe (HCC) 09/19/2017  . COPD (chronic obstructive pulmonary disease) (HCC)   . Depression   . DVT (deep venous thrombosis) (HCC)   . GERD (gastroesophageal reflux disease)   . Hyperlipidemia   . Hypertension   . Myocardial infarction (HCC) 1996   "light one"  . PE (pulmonary embolism) 04/2013   On chronic Xarelto  . Peripheral nerve disease   . Pneumonia "several times"  . Sleep apnea   . Type II diabetes mellitus (HCC)     Family History  Problem Relation Age of Onset  . Hypertension Brother   . Hypertension Father   . Diabetes Other   . Hyperlipidemia Other     Past Surgical History:    Procedure Laterality Date  . AMPUTATION Right 09/21/2017   Procedure: RIGHT GREAT TOE AMPUTATION, POSSIBLE VAC;  Surgeon: Tarry Kos, MD;  Location: [facility] OR;  Service: Orthopedics;  Laterality: Right;  . AMPUTATION Left 08/13/2020   Procedure: LEFT FOOT 4TH RAY AMPUTATION;  Surgeon: [name], MD;  Location: [facility] OR;  Service: Orthopedics;  Laterality: Left;  . CARDIAC CATHETERIZATION  2006  . CARDIAC CATHETERIZATION  1996   "@ [facility]; when I had my heart attack"  . CARDIAC CATHETERIZATION N/A 07/29/2016   Procedure: Left Heart Cath  and Coronary Angiography;  Surgeon: Runell Gess, MD;  Location: [facility] INVASIVE CV LAB;  Service: Cardiovascular;  Laterality: N/A;  . CARDIAC CATHETERIZATION N/A 07/29/2016   Procedure: Coronary Stent Intervention;  Surgeon: Runell Gess, MD;  Location: [facility] INVASIVE CV LAB;  Service: Cardiovascular;  Laterality: N/A;  . CARPAL TUNNEL RELEASE Bilateral   . CORONARY ANGIOPLASTY WITH STENT PLACEMENT  07/29/2016  . CORONARY ARTERY BYPASS GRAFT N/A 07/24/2020   Procedure: CORONARY ARTERY BYPASS GRAFTING (CABG), ON PUMP, TIMES FOUR, USING LEFT INTERNAL MAMMARY ARTERY AND ENDOSCOPICALLY HARVESTED RIGHT GREATER SAPHENOUS VEIN;  Surgeon: Corliss Skains, MD;  Location: [facility] OR;  Service: Open Heart Surgery;  Laterality: N/A;  FLOW TAC  . CORONARY STENT INTERVENTION N/A 05/30/2019   Procedure: CORONARY STENT INTERVENTION;  Surgeon: Lyn Records, MD;  Location: [facility] INVASIVE CV LAB;  Service: Cardiovascular;  Laterality: N/A;  . CORONARY STENT INTERVENTION N/A 11/02/2019   Procedure: CORONARY STENT INTERVENTION;  Surgeon: Iran Ouch, MD;  Location: [facility] INVASIVE CV LAB;  Service: Cardiovascular;  Laterality: N/A;  . ESOPHAGOGASTRODUODENOSCOPY N/A 09/22/2017   Procedure: ESOPHAGOGASTRODUODENOSCOPY (EGD);  Surgeon: Meryl Dare, MD;  Location: [facility] ENDOSCOPY;  Service: Endoscopy;  Laterality: N/A;  . INTRAVASCULAR PRESSURE WIRE/FFR STUDY N/A 07/22/2020   Procedure:  INTRAVASCULAR PRESSURE WIRE/FFR STUDY;  Surgeon: Marykay Lex, MD;  Location: [facility] INVASIVE CV LAB;  Service: Cardiovascular;  Laterality: N/A;  . KNEE ARTHROSCOPY Bilateral    "2 on left; 1 on the right"  . LEFT HEART CATH AND CORONARY ANGIOGRAPHY N/A 11/02/2019   Procedure: LEFT HEART CATH AND CORONARY ANGIOGRAPHY;  Surgeon: Iran Ouch, MD;  [facility]: [facility] INVASIVE CV LAB;  Service: Cardiovascular;  Laterality: N/A;  . LEFT HEART CATH AND CORONARY ANGIOGRAPHY N/A 07/22/2020   Procedure: LEFT HEART CATH AND CORONARY ANGIOGRAPHY;  Surgeon: Marykay Lex, MD;  Location: [facility] INVASIVE CV LAB;  Service: Cardiovascular;  Laterality: N/A;  . RIGHT/LEFT HEART CATH AND CORONARY ANGIOGRAPHY N/A 05/30/2019   Procedure: RIGHT/LEFT HEART CATH AND CORONARY ANGIOGRAPHY;  Surgeon: Lyn Records, MD;  Location: [facility] INVASIVE CV LAB;  Service: Cardiovascular;  Laterality: N/A;  . SHOULDER OPEN ROTATOR CUFF REPAIR Bilateral   . TEE WITHOUT CARDIOVERSION N/A 07/24/2020   Procedure: TRANSESOPHAGEAL ECHOCARDIOGRAM (TEE);  Surgeon: Corliss Skains, MD;  Location: [facility] OR;  Service: Open Heart Surgery;  Laterality: N/A;   Social History   Occupational History  . Occupation: disabled     Associate Professor: UNEMPLOYED  Tobacco Use  . Smoking status: Former Smoker    Packs/day: 1.00    Years: 34.00    Pack years: 34.00    Types: Cigarettes    Quit date: 07/12/2020    Years since quitting: 0.1  . Smokeless tobacco: Former Neurosurgeon    Types: Snuff, Chew  Vaping Use  . Vaping Use: Never used  Substance and Sexual Activity  . Alcohol use: Yes    Comment: RARE  . Drug use: No  . Sexual activity: Yes

## 2020-08-29 ENCOUNTER — Ambulatory Visit: Payer: Medicare Other | Admitting: Physician Assistant

## 2020-08-31 ENCOUNTER — Other Ambulatory Visit: Payer: Self-pay | Admitting: Cardiology

## 2020-09-05 ENCOUNTER — Ambulatory Visit (INDEPENDENT_AMBULATORY_CARE_PROVIDER_SITE_OTHER): Payer: Medicare Other | Admitting: Physician Assistant

## 2020-09-05 ENCOUNTER — Other Ambulatory Visit: Payer: Self-pay

## 2020-09-05 ENCOUNTER — Encounter: Payer: Self-pay | Admitting: Physician Assistant

## 2020-09-05 ENCOUNTER — Other Ambulatory Visit: Payer: Self-pay | Admitting: Physician Assistant

## 2020-09-05 VITALS — Ht 77.0 in | Wt 370.0 lb

## 2020-09-05 DIAGNOSIS — M869 Osteomyelitis, unspecified: Secondary | ICD-10-CM

## 2020-09-05 MED ORDER — DOXYCYCLINE HYCLATE 100 MG PO TABS: 100.0000 mg | ORAL_TABLET | Freq: Two times a day (BID) | ORAL | 0 refills | Status: DC

## 2020-09-05 NOTE — Progress Notes (Signed)
Office Visit Note   Patient: Joseph [name]           Date of Birth: August 20, 1971           MRN: [id number] Visit Date: 09/05/2020              Requested by: Marva Panda, NP [address] [place],  [name] [address] PCP: Marva Panda, NP  Chief Complaint  Patient presents with  . Left Foot - Routine Post Op    08/13/20 left foot 4th ray amputation       HPIThis is a pleasant 49 year old gentleman who is 3 weeks status post left foot fourth ray amputation.  His wife has been doing daily soap Dial dressing changes.  He has had some maceration and some slight odor.  He has been nonweightbearing in his fracture boot.  Denies any fever chills  Assessment & Plan: Visit Diagnoses: No diagnosis found.  Plan: I am going to put him on his doxycycline because of some of the maceration and slight increase in drainage.  Follow-up in 1 week  Follow-Up Instructions: No follow-ups on file.   Ortho Exam  Patient is alert, oriented, no adenopathy, well-dressed, normal affect, normal respiratory effort. Focused examination demonstrates some healing well approximated wound edges on the proximal end of the wound maceration in the web space with some fibrous tissue.  I could not appreciate a foul odor.  Maceration of the skin on the plantar surface of the foot mild erythema but no cellulitis.  Swelling overall is well controlled  Imaging: No results found. No images are attached to the encounter.  Labs: Lab Results  Component Value Date   HGBA1C 9.4 (H) 07/18/2020   HGBA1C 8.2 (H) 11/02/2019   HGBA1C 8.2 (H) 11/01/2019   ESRSEDRATE 41 (H) 09/16/2017   CRP 8.9 (H) 09/16/2017   REPTSTATUS 07/23/2020 FINAL 07/18/2020   REPTSTATUS 07/23/2020 FINAL 07/18/2020   CULT  07/18/2020    NO GROWTH 5 DAYS Performed at [facility], [address] [place], [name] [address]    CULT  07/18/2020    NO GROWTH 5 DAYS Performed at [facility], [address], [place], [name] [address]        Lab Results  Component Value Date   ALBUMIN 2.9 (L) 07/27/2020   ALBUMIN 2.9 (L) 07/26/2020   ALBUMIN 3.0 (L) 07/21/2020    Lab Results  Component Value Date   MG 2.0 07/25/2020   MG 2.1 07/25/2020   MG 2.3 07/24/2020   No results found for: VD25OH  No results found for: PREALBUMIN CBC EXTENDED Latest Ref Rng & Units 08/13/2020 08/13/2020 07/29/2020  WBC 4.0 - 10.5 K/uL - 7.5 12.4(H)  RBC 4.22 - 5.81 MIL/uL - 3.23(L) 2.62(L)  HGB 13.0 - 17.0 g/dL 1.2(W) 8.9(L) 7.6(L)  HCT 39 - 52 % 26.0(L) 30.8(L) 24.6(L)  PLT 150 - 400 K/uL - 266 303  NEUTROABS 1.7 - 7.7 K/uL - - -  LYMPHSABS 0.7 - 4.0 K/uL - - -     Body mass index is 43.88 kg/m.  Orders:  No orders of the defined types were placed in this encounter.  Meds ordered this encounter  Medications  . doxycycline (VIBRA-TABS) 100 MG tablet    Sig: Take 1 tablet (100 mg total) by mouth 2 (two) times daily.    Dispense:  60 tablet    Refill:  0     Procedures: No procedures performed  Clinical Data: No additional findings.  ROS:  All other systems negative, except as noted in the HPI. Review of Systems  Objective: Vital Signs: Ht 6\' 5"  (1.956 m)   Wt (!) 370 lb (167.8 kg)   BMI 43.88 kg/m   Specialty Comments:  No specialty comments available.  PMFS History: Patient Active Problem List   Diagnosis Date Noted  . Osteomyelitis of fourth toe of right foot (HCC)   . S/P CABG x 4 07/24/2020  . Non-ST elevation (NSTEMI) myocardial infarction (HCC) 07/22/2020  . Acute on chronic diastolic heart failure (HCC) 07/22/2020  . Hypotension 07/18/2020  . Left leg swelling 07/18/2020  . Elevated troponin 07/18/2020  . AKI (acute kidney injury) (HCC) 07/18/2020  . Elevated brain natriuretic peptide (BNP) level 07/18/2020  . GERD (gastroesophageal reflux disease) 07/18/2020  . Severe Sepsis secondary to Lt leg/Foot Cellulitis 07/18/2020  . Severe sepsis --Severe Sepsis secondary to Lt leg/Foot Cellulitis with  hypotension and AKI 07/18/2020  . Unstable angina (HCC) 11/01/2019  . Edema 06/13/2019  . Leukocytosis 05/31/2019  . Smoker 03/08/2019  . Chronic pain 03/08/2019  . Chronic back pain 02/16/2019  . Deep venous thrombosis (HCC) 02/16/2019  . Obesity, Class III, BMI 40-49.9 (morbid obesity) (HCC) 02/16/2019  . Peripheral nerve disease 02/16/2019  . COPD (chronic obstructive pulmonary disease) (HCC) 01/22/2019  . Anxiety disorder 03/26/2018  . Open wound of toe 10/12/2017  . Sinusitis 10/12/2017  . Abdominal pain   . Loss of weight   . Diabetic foot infection (HCC) 09/16/2017  . Uncontrolled type 2 diabetes mellitus with hyperglycemia, with long-term current use of insulin (HCC) 09/16/2017  . Cellulitis of right foot   . Chronic ulcer of great toe of right foot (HCC) 09/09/2017  . Recurrent chest pain 07/29/2016  . Hyperglycemia   . Insulin dependent type 2 diabetes mellitus (HCC) 04/29/2015  . OSA (obstructive sleep apnea) 05/08/2013  . History of pulmonary embolus (PE) 04/27/2013  . CAD -S/P PCI 11/02/19  09/10/2008  . Dyslipidemia 09/09/2008   Past Medical History:  Diagnosis Date  . Anxiety   . Arthritis    "knees, shoulders, hips, ankles" (07/29/2016)  . Asthma   . CAD (coronary artery disease)    a. 2017: s/p BMS to distal Cx; b. LHC 05/30/2019: 80% mid, distal RCA s/p DES, 30% narrowing of d LM, widely patent LAD w/ luminal irregularities, widely patent stent in dCX  w/ 90+% stenosis distal to stent beofre small trifurcating obtuse marginal (potentially area of restenosis)  . Chronic bronchitis (HCC)   . Chronic ulcer of right great toe (HCC) 09/19/2017  . COPD (chronic obstructive pulmonary disease) (HCC)   . Depression   . DVT (deep venous thrombosis) (HCC)   . GERD (gastroesophageal reflux disease)   . Hyperlipidemia   . Hypertension   . Myocardial infarction (HCC) 1996   "light one"  . PE (pulmonary embolism) 04/2013   On chronic Xarelto  . Peripheral nerve  disease   . Pneumonia "several times"  . Sleep apnea   . Type II diabetes mellitus (HCC)     Family History  Problem Relation Age of Onset  . Hypertension Brother   . Hypertension Father   . Diabetes Other   . Hyperlipidemia Other     Past Surgical History:  Procedure Laterality Date  . AMPUTATION Right 09/21/2017   Procedure: RIGHT GREAT TOE AMPUTATION, POSSIBLE VAC;  Surgeon: 11/21/2017, MD;  Location: [facility] OR;  Service: Orthopedics;  Laterality: Right;  . AMPUTATION Left 08/13/2020   Procedure: LEFT  FOOT 4TH RAY AMPUTATION;  Surgeon: [name], MD;  Location: [facility] OR;  Service: Orthopedics;  Laterality: Left;  . CARDIAC CATHETERIZATION  2006  . CARDIAC CATHETERIZATION  1996   "@ [facility]; when I had my heart attack"  . CARDIAC CATHETERIZATION N/A 07/29/2016   Procedure: Left Heart Cath and Coronary Angiography;  Surgeon: Runell Gess, MD;  Location: [facility] INVASIVE CV LAB;  Service: Cardiovascular;  Laterality: N/A;  . CARDIAC CATHETERIZATION N/A 07/29/2016   Procedure: Coronary Stent Intervention;  Surgeon: Runell Gess, MD;  Location: [facility] INVASIVE CV LAB;  Service: Cardiovascular;  Laterality: N/A;  . CARPAL TUNNEL RELEASE Bilateral   . CORONARY ANGIOPLASTY WITH STENT PLACEMENT  07/29/2016  . CORONARY ARTERY BYPASS GRAFT N/A 07/24/2020   Procedure: CORONARY ARTERY BYPASS GRAFTING (CABG), ON PUMP, TIMES FOUR, USING LEFT INTERNAL MAMMARY ARTERY AND ENDOSCOPICALLY HARVESTED RIGHT GREATER SAPHENOUS VEIN;  Surgeon: Corliss Skains, MD;  Location: [facility] OR;  Service: Open Heart Surgery;  Laterality: N/A;  FLOW TAC  . CORONARY STENT INTERVENTION N/A 05/30/2019   Procedure: CORONARY STENT INTERVENTION;  Surgeon: Lyn Records, MD;  Location: [facility] INVASIVE CV LAB;  Service: Cardiovascular;  Laterality: N/A;  . CORONARY STENT INTERVENTION N/A 11/02/2019   Procedure: CORONARY STENT INTERVENTION;  Surgeon: Iran Ouch, MD;  Location: [facility] INVASIVE CV LAB;  Service: Cardiovascular;   Laterality: N/A;  . ESOPHAGOGASTRODUODENOSCOPY N/A 09/22/2017   Procedure: ESOPHAGOGASTRODUODENOSCOPY (EGD);  Surgeon: Meryl Dare, MD;  Location: [facility] ENDOSCOPY;  Service: Endoscopy;  Laterality: N/A;  . INTRAVASCULAR PRESSURE WIRE/FFR STUDY N/A 07/22/2020   Procedure: INTRAVASCULAR PRESSURE WIRE/FFR STUDY;  Surgeon: Marykay Lex, MD;  Location: [facility] INVASIVE CV LAB;  Service: Cardiovascular;  Laterality: N/A;  . KNEE ARTHROSCOPY Bilateral    "2 on left; 1 on the right"  . LEFT HEART CATH AND CORONARY ANGIOGRAPHY N/A 11/02/2019   Procedure: LEFT HEART CATH AND CORONARY ANGIOGRAPHY;  Surgeon: Iran Ouch, MD;  Location: [facility] INVASIVE CV LAB;  Service: Cardiovascular;  Laterality: N/A;  . LEFT HEART CATH AND CORONARY ANGIOGRAPHY N/A 07/22/2020   Procedure: LEFT HEART CATH AND CORONARY ANGIOGRAPHY;  Surgeon: Marykay Lex, MD;  Location: [facility] INVASIVE CV LAB;  Service: Cardiovascular;  Laterality: N/A;  . RIGHT/LEFT HEART CATH AND CORONARY ANGIOGRAPHY N/A 05/30/2019   Procedure: RIGHT/LEFT HEART CATH AND CORONARY ANGIOGRAPHY;  Surgeon: Lyn Records, MD;  Location: [facility] INVASIVE CV LAB;  Service: Cardiovascular;  Laterality: N/A;  . SHOULDER OPEN ROTATOR CUFF REPAIR Bilateral   . TEE WITHOUT CARDIOVERSION N/A 07/24/2020   Procedure: TRANSESOPHAGEAL ECHOCARDIOGRAM (TEE);  Surgeon: Corliss Skains, MD;  Location: [facility] OR;  Service: Open Heart Surgery;  Laterality: N/A;   Social History   Occupational History  . Occupation: disabled     Associate Professor: UNEMPLOYED  Tobacco Use  . Smoking status: Former Smoker    Packs/day: 1.00    Years: 34.00    Pack years: 34.00    Types: Cigarettes    Quit date: 07/12/2020    Years since quitting: 0.1  . Smokeless tobacco: Former Neurosurgeon    Types: Snuff, Chew  Vaping Use  . Vaping Use: Never used  Substance and Sexual Activity  . Alcohol use: Yes    Comment: RARE  . Drug use: No  . Sexual activity: Yes

## 2020-09-11 ENCOUNTER — Other Ambulatory Visit: Payer: Self-pay | Admitting: Thoracic Surgery (Cardiothoracic Vascular Surgery)

## 2020-09-11 DIAGNOSIS — Z951 Presence of aortocoronary bypass graft: Secondary | ICD-10-CM

## 2020-09-12 ENCOUNTER — Ambulatory Visit (INDEPENDENT_AMBULATORY_CARE_PROVIDER_SITE_OTHER): Payer: Self-pay | Admitting: Thoracic Surgery (Cardiothoracic Vascular Surgery)

## 2020-09-12 ENCOUNTER — Encounter: Payer: Self-pay | Admitting: Thoracic Surgery (Cardiothoracic Vascular Surgery)

## 2020-09-12 ENCOUNTER — Other Ambulatory Visit: Payer: Self-pay

## 2020-09-12 ENCOUNTER — Ambulatory Visit
Admission: RE | Admit: 2020-09-12 | Discharge: 2020-09-12 | Disposition: A | Discharge status: Home / Self Care | Payer: Medicare Other | Source: Ambulatory Visit | Attending: Thoracic Surgery (Cardiothoracic Vascular Surgery) | Admitting: Thoracic Surgery (Cardiothoracic Vascular Surgery)

## 2020-09-12 VITALS — BP 126/80 | HR 100 | Temp 98.1°F | Resp 20 | Ht 77.0 in | Wt 359.0 lb

## 2020-09-12 DIAGNOSIS — Z951 Presence of aortocoronary bypass graft: Secondary | ICD-10-CM

## 2020-09-12 DIAGNOSIS — I2510 Atherosclerotic heart disease of native coronary artery without angina pectoris: Secondary | ICD-10-CM

## 2020-09-12 NOTE — Progress Notes (Signed)
      [address]       [place] [address]             [phone]        Joseph [name] [place] [facility] Medical Record #[id number] Date of Birth: August 26, 1971  Referring: Chilton Si, MD Primary Care: Marva Panda, NP Primary Cardiologist:Tiffany [place] Salvia, MD  Reason for visit:   follow-up  History of Present Illness:     Joseph [name] is done well.  He is also recovering well from his toe amputation.  His blood sugars have been well controlled at home.  Physical Exam: BP 126/80   Pulse 100   Temp 98.1 F (36.7 C) (Skin)   Resp 20   Ht 6\' 5"  (1.956 m)   Wt (!) 359 lb (162.8 kg)   SpO2 96% Comment: RA  BMI 42.57 kg/m   Alert NAD Incision clean.  Sternum stable Abdomen soft, ND 1+ peripheral edema   Diagnostic Studies & Laboratory data: CXR: Clear     Assessment / Plan:   49 year old male status post CABG.  Currently doing well Cleared for cardiac rehab Will follow-up as needed.   57 09/12/2020 2:23 PM

## 2020-09-15 ENCOUNTER — Ambulatory Visit: Payer: Medicare Other | Admitting: Physician Assistant

## 2020-09-16 ENCOUNTER — Ambulatory Visit (INDEPENDENT_AMBULATORY_CARE_PROVIDER_SITE_OTHER): Payer: Medicare Other | Admitting: Physician Assistant

## 2020-09-16 ENCOUNTER — Encounter: Payer: Self-pay | Admitting: Physician Assistant

## 2020-09-16 VITALS — Ht 77.0 in | Wt 359.0 lb

## 2020-09-16 DIAGNOSIS — M869 Osteomyelitis, unspecified: Secondary | ICD-10-CM

## 2020-09-16 NOTE — Progress Notes (Signed)
Office Visit Note   Patient: Joseph [name]           Date of Birth: [birth year]           MRN: [id number] Visit Date: 09/16/2020              Requested by: Marva Panda, NP [address] [place],  [name] [address] PCP: Marva Panda, NP  Chief Complaint  Patient presents with  . Left Foot - Routine Post Op    08/13/20 left foot 4th ray amputation       HPI: Patient is 1 month status post left foot fourth ray amputation.  His wife has been doing daily dressing changes and is noticed a lot of skin maceration on the bottom part of the foot.  He is taking antibiotics.  Assessment & Plan: Visit Diagnoses: No diagnosis found.  Plan: We have given him a compression stocking to try to extra-large.  This should be changed and longer daily.  Patient was seen directly by Dr. [name] emphasized the importance of keeping the wound dry  Follow-Up Instructions: No follow-ups on file.   Ortho Exam  Patient is alert, oriented, no adenopathy, well-dressed, normal affect, normal respiratory effort. Focused examination of the foot top of the incision is healing fairly well with just a minimal amount of erythema right around the wound no surrounding cellulitis.  The web space there is still some dehiscence but does not probe deeply on the bottom of the foot there is significant skin maceration.  Does not probe deeply no surrounding cellulitis mild foul odor after verbal consent some of this macerated skin was trimmed away  Imaging: No results found. No images are attached to the encounter.  Labs: Lab Results  Component Value Date   HGBA1C 9.4 (H) 07/18/2020   HGBA1C 8.2 (H) 11/02/2019   HGBA1C 8.2 (H) 11/01/2019   ESRSEDRATE 41 (H) 09/16/2017   CRP 8.9 (H) 09/16/2017   REPTSTATUS 07/23/2020 FINAL 07/18/2020   REPTSTATUS 07/23/2020 FINAL 07/18/2020   CULT  07/18/2020    NO GROWTH 5 DAYS Performed at [facility], [address] [place], [name] [address]    CULT   07/18/2020    NO GROWTH 5 DAYS Performed at [facility], [address] [place], [name] [address]      Lab Results  Component Value Date   ALBUMIN 2.9 (L) 07/27/2020   ALBUMIN 2.9 (L) 07/26/2020   ALBUMIN 3.0 (L) 07/21/2020    Lab Results  Component Value Date   MG 2.0 07/25/2020   MG 2.1 07/25/2020   MG 2.3 07/24/2020   No results found for: VD25OH  No results found for: PREALBUMIN CBC EXTENDED Latest Ref Rng & Units 08/13/2020 08/13/2020 07/29/2020  WBC 4.0 - 10.5 K/uL - 7.5 12.4(H)  RBC 4.22 - 5.81 MIL/uL - 3.23(L) 2.62(L)  HGB 13.0 - 17.0 g/dL 0.6(T) 8.9(L) 7.6(L)  HCT 39 - 52 % 26.0(L) 30.8(L) 24.6(L)  PLT 150 - 400 K/uL - 266 303  NEUTROABS 1.7 - 7.7 K/uL - - -  LYMPHSABS 0.7 - 4.0 K/uL - - -     Body mass index is 42.57 kg/m.  Orders:  No orders of the defined types were placed in this encounter.  No orders of the defined types were placed in this encounter.    Procedures: No procedures performed  Clinical Data: No additional findings.  ROS:  All other systems negative, except as noted in the HPI. Review of Systems  Objective:  Vital Signs: Ht 6\' 5"  (1.956 m)   Wt (!) 359 lb (162.8 kg)   BMI 42.57 kg/m   Specialty Comments:  No specialty comments available.  PMFS History: Patient Active Problem List   Diagnosis Date Noted  . Osteomyelitis of fourth toe of right foot (HCC)   . S/P CABG x 4 07/24/2020  . Non-ST elevation (NSTEMI) myocardial infarction (HCC) 07/22/2020  . Acute on chronic diastolic heart failure (HCC) 07/22/2020  . Hypotension 07/18/2020  . Left leg swelling 07/18/2020  . Elevated troponin 07/18/2020  . AKI (acute kidney injury) (HCC) 07/18/2020  . Elevated brain natriuretic peptide (BNP) level 07/18/2020  . GERD (gastroesophageal reflux disease) 07/18/2020  . Severe Sepsis secondary to Lt leg/Foot Cellulitis 07/18/2020  . Severe sepsis --Severe Sepsis secondary to Lt leg/Foot Cellulitis with hypotension and AKI  07/18/2020  . Unstable angina (HCC) 11/01/2019  . Edema 06/13/2019  . Leukocytosis 05/31/2019  . Smoker 03/08/2019  . Chronic pain 03/08/2019  . Chronic back pain 02/16/2019  . Deep venous thrombosis (HCC) 02/16/2019  . Obesity, Class III, BMI 40-49.9 (morbid obesity) (HCC) 02/16/2019  . Peripheral nerve disease 02/16/2019  . COPD (chronic obstructive pulmonary disease) (HCC) 01/22/2019  . Anxiety disorder 03/26/2018  . Open wound of toe 10/12/2017  . Sinusitis 10/12/2017  . Abdominal pain   . Loss of weight   . Diabetic foot infection (HCC) 09/16/2017  . Uncontrolled type 2 diabetes mellitus with hyperglycemia, with long-term current use of insulin (HCC) 09/16/2017  . Cellulitis of right foot   . Chronic ulcer of great toe of right foot (HCC) 09/09/2017  . Recurrent chest pain 07/29/2016  . Hyperglycemia   . Insulin dependent type 2 diabetes mellitus (HCC) 04/29/2015  . OSA (obstructive sleep apnea) 05/08/2013  . History of pulmonary embolus (PE) 04/27/2013  . CAD -S/P PCI 11/02/19  09/10/2008  . Dyslipidemia 09/09/2008   Past Medical History:  Diagnosis Date  . Anxiety   . Arthritis    "knees, shoulders, hips, ankles" (07/29/2016)  . Asthma   . CAD (coronary artery disease)    a. 2017: s/p BMS to distal Cx; b. LHC 05/30/2019: 80% mid, distal RCA s/p DES, 30% narrowing of d LM, widely patent LAD w/ luminal irregularities, widely patent stent in dCX  w/ 90+% stenosis distal to stent beofre small trifurcating obtuse marginal (potentially area of restenosis)  . Chronic bronchitis (HCC)   . Chronic ulcer of right great toe (HCC) 09/19/2017  . COPD (chronic obstructive pulmonary disease) (HCC)   . Depression   . DVT (deep venous thrombosis) (HCC)   . GERD (gastroesophageal reflux disease)   . Hyperlipidemia   . Hypertension   . Myocardial infarction (HCC) 1996   "light one"  . PE (pulmonary embolism) 04/2013   On chronic Xarelto  . Peripheral nerve disease   . Pneumonia  "several times"  . Sleep apnea   . Type II diabetes mellitus (HCC)     Family History  Problem Relation Age of Onset  . Hypertension Brother   . Hypertension Father   . Diabetes Other   . Hyperlipidemia Other     Past Surgical History:  Procedure Laterality Date  . AMPUTATION Right 09/21/2017   Procedure: RIGHT GREAT TOE AMPUTATION, POSSIBLE VAC;  Surgeon: 11/21/2017, MD;  Location: [facility] OR;  Service: Orthopedics;  Laterality: Right;  . AMPUTATION Left 08/13/2020   Procedure: LEFT FOOT 4TH RAY AMPUTATION;  Surgeon: 08/15/2020, MD;  Location: [facility] OR;  Service: Orthopedics;  Laterality: Left;  . CARDIAC CATHETERIZATION  2006  . CARDIAC CATHETERIZATION  1996   "@ [facility]; when I had my heart attack"  . CARDIAC CATHETERIZATION N/A 07/29/2016   Procedure: Left Heart Cath and Coronary Angiography;  Surgeon: Runell Gess, MD;  Location: [facility] INVASIVE CV LAB;  Service: Cardiovascular;  Laterality: N/A;  . CARDIAC CATHETERIZATION N/A 07/29/2016   Procedure: Coronary Stent Intervention;  Surgeon: Runell Gess, MD;  Location: [facility] INVASIVE CV LAB;  Service: Cardiovascular;  Laterality: N/A;  . CARPAL TUNNEL RELEASE Bilateral   . CORONARY ANGIOPLASTY WITH STENT PLACEMENT  07/29/2016  . CORONARY ARTERY BYPASS GRAFT N/A 07/24/2020   Procedure: CORONARY ARTERY BYPASS GRAFTING (CABG), ON PUMP, TIMES FOUR, USING LEFT INTERNAL MAMMARY ARTERY AND ENDOSCOPICALLY HARVESTED RIGHT GREATER SAPHENOUS VEIN;  Surgeon: Corliss Skains, MD;  Location: [facility] OR;  Service: Open Heart Surgery;  Laterality: N/A;  FLOW TAC  . CORONARY STENT INTERVENTION N/A 05/30/2019   Procedure: CORONARY STENT INTERVENTION;  Surgeon: Lyn Records, MD;  Location: [facility] INVASIVE CV LAB;  Service: Cardiovascular;  Laterality: N/A;  . CORONARY STENT INTERVENTION N/A 11/02/2019   Procedure: CORONARY STENT INTERVENTION;  Surgeon: Iran Ouch, MD;  Location: [facility] INVASIVE CV LAB;  Service: Cardiovascular;  Laterality: N/A;  .  ESOPHAGOGASTRODUODENOSCOPY N/A 09/22/2017   Procedure: ESOPHAGOGASTRODUODENOSCOPY (EGD);  Surgeon: Meryl Dare, MD;  Location: [facility] ENDOSCOPY;  Service: Endoscopy;  Laterality: N/A;  . INTRAVASCULAR PRESSURE WIRE/FFR STUDY N/A 07/22/2020   Procedure: INTRAVASCULAR PRESSURE WIRE/FFR STUDY;  Surgeon: Marykay Lex, MD;  Location: [facility] INVASIVE CV LAB;  Service: Cardiovascular;  Laterality: N/A;  . KNEE ARTHROSCOPY Bilateral    "2 on left; 1 on the right"  . LEFT HEART CATH AND CORONARY ANGIOGRAPHY N/A 11/02/2019   Procedure: LEFT HEART CATH AND CORONARY ANGIOGRAPHY;  Surgeon: Iran Ouch, MD;  Location: [facility] INVASIVE CV LAB;  Service: Cardiovascular;  Laterality: N/A;  . LEFT HEART CATH AND CORONARY ANGIOGRAPHY N/A 07/22/2020   Procedure: LEFT HEART CATH AND CORONARY ANGIOGRAPHY;  Surgeon: Marykay Lex, MD;  Location: [facility] INVASIVE CV LAB;  Service: Cardiovascular;  Laterality: N/A;  . RIGHT/LEFT HEART CATH AND CORONARY ANGIOGRAPHY N/A 05/30/2019   Procedure: RIGHT/LEFT HEART CATH AND CORONARY ANGIOGRAPHY;  Surgeon: Lyn Records, MD;  Location: [facility] INVASIVE CV LAB;  Service: Cardiovascular;  Laterality: N/A;  . SHOULDER OPEN ROTATOR CUFF REPAIR Bilateral   . TEE WITHOUT CARDIOVERSION N/A 07/24/2020   Procedure: TRANSESOPHAGEAL ECHOCARDIOGRAM (TEE);  Surgeon: Corliss Skains, MD;  Location: [facility] OR;  Service: Open Heart Surgery;  Laterality: N/A;   Social History   Occupational History  . Occupation: disabled     Associate Professor: UNEMPLOYED  Tobacco Use  . Smoking status: Former Smoker    Packs/day: 1.00    Years: 34.00    Pack years: 34.00    Types: Cigarettes    Quit date: 07/12/2020    Years since quitting: 0.1  . Smokeless tobacco: Former Neurosurgeon    Types: Snuff, Chew  Vaping Use  . Vaping Use: Never used  Substance and Sexual Activity  . Alcohol use: Yes    Comment: RARE  . Drug use: No  . Sexual activity: Yes

## 2020-09-23 ENCOUNTER — Other Ambulatory Visit: Payer: Self-pay

## 2020-09-23 ENCOUNTER — Ambulatory Visit: Payer: Medicare Other | Admitting: Physician Assistant

## 2020-09-23 NOTE — Progress Notes (Signed)
Cardiac Rehab referral made per Dr. Cliffton Asters.

## 2020-09-24 NOTE — Progress Notes (Signed)
Cardiology Clinic Note   Patient Name: Joseph [name] Date of Encounter: 09/26/2020  Primary Care Provider:  [name], NP Primary Cardiologist:  Joseph Latch, MD  Patient Profile    Joseph [name] 49 year old male presents today for follow-up of his coronary artery disease, obstructive sleep apnea, chest pain, and post admission for osteomyelitis..  Past Medical History    Past Medical History:  Diagnosis Date  . Anxiety   . Arthritis    "knees, shoulders, hips, ankles" (07/29/2016)  . Asthma   . CAD (coronary artery disease)    a. 2017: s/p BMS to distal Cx; b. LHC 05/30/2019: 80% mid, distal RCA s/p DES, 30% narrowing of d LM, widely patent LAD w/ luminal irregularities, widely patent stent in [place]  w/ 90+% stenosis distal to stent beofre small trifurcating obtuse marginal (potentially area of restenosis)  . Chronic bronchitis ([place])   . Chronic ulcer of right great toe ([place]) 09/19/2017  . COPD (chronic obstructive pulmonary disease) ([place])   . Depression   . DVT (deep venous thrombosis) ([place])   . GERD (gastroesophageal reflux disease)   . Hyperlipidemia   . Hypertension   . Myocardial infarction ([place]) 1996   "light one"  . PE (pulmonary embolism) 04/2013   On chronic Xarelto  . Peripheral nerve disease   . Pneumonia "several times"  . Sleep apnea   . Type II diabetes mellitus ([place])    Past Surgical History:  Procedure Laterality Date  . AMPUTATION Right 09/21/2017   Procedure: RIGHT GREAT TOE AMPUTATION, POSSIBLE VAC;  Surgeon: Leandrew Koyanagi, MD;  Location: [place];  Service: Orthopedics;  Laterality: Right;  . AMPUTATION Left 08/13/2020   Procedure: LEFT FOOT 4TH RAY AMPUTATION;  Surgeon: Newt Minion, MD;  Location: [place];  Service: Orthopedics;  Laterality: Left;  . CARDIAC CATHETERIZATION  2006  . CARDIAC CATHETERIZATION  1996   "@ [facility]; when I had my heart attack"  . CARDIAC CATHETERIZATION N/A 07/29/2016   Procedure: Left Heart Cath and Coronary  Angiography;  Surgeon: Lorretta Harp, MD;  Location: [place] [facility];  Service: Cardiovascular;  Laterality: N/A;  . CARDIAC CATHETERIZATION N/A 07/29/2016   Procedure: Coronary Stent Intervention;  Surgeon: Lorretta Harp, MD;  Location: [place] [facility];  Service: Cardiovascular;  Laterality: N/A;  . CARPAL TUNNEL RELEASE Bilateral   . CORONARY ANGIOPLASTY WITH STENT PLACEMENT  07/29/2016  . CORONARY ARTERY BYPASS GRAFT N/A 07/24/2020   Procedure: CORONARY ARTERY BYPASS GRAFTING (CABG), ON PUMP, TIMES FOUR, USING LEFT INTERNAL MAMMARY ARTERY AND ENDOSCOPICALLY HARVESTED RIGHT GREATER SAPHENOUS VEIN;  Surgeon: Lajuana Matte, MD;  Location: [place];  Service: Open Heart Surgery;  Laterality: N/A;  FLOW TAC  . CORONARY STENT INTERVENTION N/A 05/30/2019   Procedure: CORONARY STENT INTERVENTION;  Surgeon: Belva Crome, MD;  Location: [place] [facility];  Service: Cardiovascular;  Laterality: N/A;  . CORONARY STENT INTERVENTION N/A 11/02/2019   Procedure: CORONARY STENT INTERVENTION;  Surgeon: Wellington Hampshire, MD;  Location: [place] [facility];  Service: Cardiovascular;  Laterality: N/A;  . ESOPHAGOGASTRODUODENOSCOPY N/A 09/22/2017   Procedure: ESOPHAGOGASTRODUODENOSCOPY (EGD);  Surgeon: Ladene Artist, MD;  Location: [facility];  Service: Endoscopy;  Laterality: N/A;  . INTRAVASCULAR PRESSURE WIRE/FFR STUDY N/A 07/22/2020   Procedure: INTRAVASCULAR PRESSURE WIRE/FFR STUDY;  Surgeon: Leonie Man, MD;  Location: [place] [facility];  Service: Cardiovascular;  Laterality: N/A;  . KNEE ARTHROSCOPY Bilateral    "2 on left;  1 on the right"  . LEFT HEART CATH AND CORONARY ANGIOGRAPHY N/A 11/02/2019   Procedure: LEFT HEART CATH AND CORONARY ANGIOGRAPHY;  Surgeon: Wellington Hampshire, MD;  Location: [place] [facility];  Service: Cardiovascular;  Laterality: N/A;  . LEFT HEART CATH AND CORONARY ANGIOGRAPHY N/A 07/22/2020   Procedure: LEFT HEART CATH AND CORONARY ANGIOGRAPHY;  Surgeon: Leonie Man, MD;  Location: [place] [facility];  Service: Cardiovascular;  Laterality: N/A;  . RIGHT/LEFT HEART CATH AND CORONARY ANGIOGRAPHY N/A 05/30/2019   Procedure: RIGHT/LEFT HEART CATH AND CORONARY ANGIOGRAPHY;  Surgeon: Belva Crome, MD;  Location: [place] [facility];  Service: Cardiovascular;  Laterality: N/A;  . SHOULDER OPEN ROTATOR CUFF REPAIR Bilateral   . TEE WITHOUT CARDIOVERSION N/A 07/24/2020   Procedure: TRANSESOPHAGEAL ECHOCARDIOGRAM (TEE);  Surgeon: Lajuana Matte, MD;  Location: [place];  Service: Open Heart Surgery;  Laterality: N/A;    Allergies  Allergies  Allergen Reactions  . Sulfa Antibiotics Other (See Comments)    headache    History of Present Illness    [name] had a history of normal coronary arteries in 2006.  He has multiple comorbidities including insulin-dependent diabetes, hypertension, hyperlipidemia, sleep apnea, chronic pain, prior pulmonary embolism in 2014 on chronic Xarelto, smoking and obesity.  At one time he weighed over 600 pounds.  In 2017 he was evaluated for chest pain.  He underwent cardiac catheterization which showed an 80% lesion in his distal circumflex.  The lesion was treated with a bare-metal stent.  He had no other significant CAD at that time.  An echocardiogram in 07/2016 showed an LVEF of 55 to 60% with mild LVH.  In March 2020 he presented to the clinic with complaints of chest pain and lower extremity edema.  Low-dose carvedilol was added to his medication regimen and he was not noted to have any lower extremity edema on exam.  An echocardiogram and Myoview were ordered but were delayed due to the COVID-19 pandemic.  He was given low-dose nitrate but could not tolerate the therapy due to headache.  A Myoview was completed on 05/23/2019 and was abnormal with new LVEF of 40%.  He underwent a diagnostic cardiac catheterization on 05/30/2019.  His catheterization showed his previous circumflex stent was patent however, he had distal 90%  stenosis and medical management was recommended.  He also had a 80% mid RCA lesion that was treated with PCI/DES.  He was  seen by Kerin Ransom, PA-C via virtual platform on 06/13/2019.  He had no complaints of chest pain but still indicated he had lower extremity edema.  HCTZ 2.5 mg 3 times per week was prescribed.  He presented the clinic 11/01/2019 and stated he had noticed intermittent chest pain for the last 3 weeks.  He stated that on Monday and Tuesday he had chest pain both at rest and with activity.  He stated that this chest pain was relieved by nitroglycerin.  He stated he had to take 3 nitroglycerin for his chest pain to stop and that it lasted around 20 minutes with each episode.  He described the pain as heavy pressure on his chest that radiated down his left arm.  He stated that the chest pain he had with these 2 most recent episodes was very similar to the chest pain he had in June with his stent placement but had been worse.  When asked about when he last had chest pain he stated" this morning at 2 AM".  He did not take nitroglycerin this morning and states he went back to sleep.  He is currently not having chest pain.  His weight has increased about 50 pounds since he was last seen in June.  He stated he did not weigh himself daily or monitor his blood pressure regularly.  He was eating convenience foods such as fast food regularly.  He stated he was compliant with his medications.  I had a long conversation with both the patient and his wife about having a strict low-sodium diet, medication compliance, physical activity, and his comorbidities.  I  discussed the case with the DOD in clinic and sent him to the [facility] ED to be evaluated for ACS.  I  alerted the cardiology team that he was on his way to the hospital.  He underwent cardiac catheterization 11/02/2019 which showed a culprit lesion to be his distal circumflex which was 90% occluded he received DES x1.  His mid LAD was 35% stenosed,  first diagonal 50% stenosed, ostial circumflex 45% proximal RCA 25%, and right posterior descending artery 30% stenosed.  His EF was normal.  His echocardiogram showed an EF of 55-60%, mild LVH, G2 DD, and mild dilation of the left atria.  He had repeat cardiac catheterization 07/22/2020 which showed severe distal left main 70% stenosis, 55% stenosed side branch in the circumflex, 60% mid circumflex first lesion, 50% stenosed side branch and first marginal.  Mid circumflex overlapping bare-metal stent and DES 5% stenosed, first diagonal 60% stenosed, third diagonal 60% stenosed.  Proximal RCA to mid RCA stented segment 25% stenosed.  And distal RCA lesion 50% stenosed with 65% stenosis of the side branch and RPDA.  He was referred to CVTS for consultation.  He underwent CABG x4 (LIMA-LAD, SVG-D1, SVG-OM, SVG-PDA 07/24/2020).  Wound care was consulted to evaluate his left fourth toe.  He was discharged 07/31/2020 in stable condition.  He developed osteomyelitis in his left fourth toe and received left foot fourth ray amputation 08/13/2020.  He was placed on wound VAC, received perioperative antibiotics.  On follow-up 09/05/2020 he was noted to have some maceration, drainage and slight odor to his operative area.  He was placed on doxycycline.  He was seen in follow-up on 09/16/2020.  Skin maceration was again noted.  However it was found to be along the bottom of his foot.  He was instructed to use compression stockings and keep the area dry.  He was continued on antibiotics.  He was seen in follow-up by Dr. Kipp Brood on 09/12/2020.  Chest x-ray was clear at that time.  Chest incision is clean, sternum stable, abdomen soft and nontender, noted to have +1 peripheral edema.  He was cleared for cardiac rehab at that time and instructed to follow-up as needed.  He presents to the clinic today for follow-up evaluation states he does not remember seeing me back in November prior to his cardiac catheterization.  He states that  he is limited in increasing his physical activity due to his wound on his left foot.  He has cut a large amount of salt out of his diet and is now using seasoning such as Mrs. Dash on his food.  He states he is working with orthopedics so that he will get an infection in his left foot.  His chest incision is clean dry intact.  He states he does notice some muscular pain in both his right and left chest.  He has noticed his blood pressure is somewhat elevated at  home at times.  However, he is still taking his midodrine.  I will give him salty 6 diet sheet, have him increase his physical activity within the parameters of his restrictions, give him a blood pressure log, and stop his midodrine at this time.  We will have him follow-up in 3 months.  He denies  fatigue, palpitations, melena, hematuria, hemoptysis, diaphoresis, weakness, presyncope, and syncope.  Home Medications    Prior to Admission medications   Medication Sig Start Date End Date Taking? Authorizing Provider  albuterol (PROAIR HFA) 108 (90 BASE) MCG/ACT inhaler Inhale 2 puffs into the lungs every 6 (six) hours as needed for wheezing or shortness of breath.     [provider]  ALPRAZolam Duanne Moron) 1 MG tablet Take 1-2 mg by mouth 4 (four) times daily as needed (anxiety).     [provider]  aspirin 325 MG EC tablet Take 1 tablet (325 mg total) by mouth daily. 07/31/20   Elgie Collard, PA-C  atorvastatin (LIPITOR) 80 MG tablet Take 1 tablet (80 mg total) by mouth daily at 6 PM. 05/31/19   Sande Rives E, PA-C  blood glucose meter kit and supplies KIT Dispense based on patient and insurance preference. Use up to four times daily as directed. (FOR ICD-9 250.00, 250.01). 09/23/17   Hongalgi, Lenis Dickinson, MD  buPROPion (WELLBUTRIN SR) 150 MG 12 hr tablet Take 150 mg by mouth 2 (two) times daily.  08/17/16   [provider]  carvedilol (COREG) 3.125 MG tablet TAKE 1 TABLET(3.125 MG) BY MOUTH TWICE DAILY 09/01/20   Elgie Collard, PA-C  diphenhydrAMINE (BENADRYL) 25 mg capsule Take 50 mg by mouth every 6 (six) hours as needed for allergies.    [provider]  doxycycline (VIBRA-TABS) 100 MG tablet Take 1 tablet (100 mg total) by mouth 2 (two) times daily. 09/05/20   Persons, Bevely Palmer, PA  escitalopram (LEXAPRO) 10 MG tablet Take 10 mg by mouth daily. 06/10/20   [provider]  fenofibrate (TRICOR) 145 MG tablet Take 145 mg by mouth daily.     [provider]  fluconazole (DIFLUCAN) 100 MG tablet Take 100 mg by mouth every Monday.    [provider]  fluticasone (FLONASE) 50 MCG/ACT nasal spray Place 2 sprays into both nostrils daily as needed for allergies. 03/14/19   [provider]  HUMALOG KWIKPEN 100 UNIT/ML SOPN Inject 15-40 Units into the skin 3 (three) times daily with meals. sliding scale 05/02/13   [provider]  LANTUS SOLOSTAR 100 UNIT/ML Solostar Pen Inject 40 Units into the skin 2 (two) times daily. 07/31/20   Elgie Collard, PA-C  meclizine (ANTIVERT) 25 MG tablet Take 25-50 mg by mouth See admin instructions. Take 2 tablets (50 mg) by mouth in the morning & take 1 tablet (25 mg) by mouth at night. 04/10/13   [provider]  metFORMIN (GLUCOPHAGE) 1000 MG tablet Take 1 tablet (1,000 mg total) by mouth 2 (two) times daily with a meal. 11/05/19   Isaiah Serge, NP  midodrine (PROAMATINE) 5 MG tablet Take 1 tablet (5 mg total) by mouth 3 (three) times daily with meals. 07/31/20   Elgie Collard, PA-C  oxyCODONE (ROXICODONE) 15 MG immediate release tablet Take 15 mg by mouth 5 (five) times daily.     [provider]  pantoprazole (PROTONIX) 40 MG tablet Take 1 tablet (40 mg total) by mouth daily. Patient taking differently: Take 80 mg by mouth daily.  09/23/17   Hongalgi, Lenis Dickinson, MD  pregabalin (LYRICA) 150 MG capsule Take 150 mg by mouth 3 (three) times daily.  05/06/19   [provider]  VICTOZA 18 MG/3ML SOPN Inject 1.2 mg  into the skin at bedtime. 04/30/19   [provider]    Family History    Family History  Problem Relation Age of Onset  . Hypertension Brother   . Hypertension Father   . Diabetes Other   . Hyperlipidemia Other    He indicated that his mother is alive. He indicated that his father is alive. He indicated that his brother is alive. He indicated that the status of his other is unknown.  Social History    Social History   Socioeconomic History  . Marital status: Married    Spouse name: Not on file  . Number of children: Not on file  . Years of education: Not on file  . Highest education level: Not on file  Occupational History  . Occupation: disabled     Fish farm manager: UNEMPLOYED  Tobacco Use  . Smoking status: Former Smoker    Packs/day: 1.00    Years: 34.00    Pack years: 34.00    Types: Cigarettes    Quit date: 07/12/2020    Years since quitting: 0.2  . Smokeless tobacco: Former Systems developer    Types: Snuff, Chew  Vaping Use  . Vaping Use: Never used  Substance and Sexual Activity  . Alcohol use: Yes    Comment: RARE  . Drug use: No  . Sexual activity: Yes  Other Topics Concern  . Not on file  Social History Narrative  . Not on file   Social Determinants of Health   Financial Resource Strain:   . Difficulty of Paying Living Expenses: Not on file  Food Insecurity:   . Worried About Charity fundraiser in the Last Year: Not on file  . Ran Out of Food in the Last Year: Not on file  Transportation Needs:   . Lack of Transportation (Medical): Not on file  . Lack of Transportation (Non-Medical): Not on file  Physical Activity:   . Days of Exercise per Week: Not on file  . Minutes of Exercise per Session: Not on file  Stress:   . Feeling of Stress : Not on file  Social Connections:   . Frequency of Communication with Friends and Family: Not on file  . Frequency of Social Gatherings with Friends and Family: Not on file  . Attends Religious Services: Not on file   . Active Member of Clubs or Organizations: Not on file  . Attends Archivist Meetings: Not on file  . Marital Status: Not on file  Intimate Partner Violence:   . Fear of Current or Ex-Partner: Not on file  . Emotionally Abused: Not on file  . Physically Abused: Not on file  . Sexually Abused: Not on file     Review of Systems    General:  No chills, fever, night sweats or weight changes.  Cardiovascular:  No chest pain, dyspnea on exertion, edema, orthopnea, palpitations, paroxysmal nocturnal dyspnea. Dermatological: No rash, lesions/masses Respiratory: No cough, dyspnea Urologic: No hematuria, dysuria Abdominal:   No nausea, vomiting, diarrhea, bright red blood per rectum, melena, or hematemesis Neurologic:  No visual changes, wkns, changes in mental status. All other systems reviewed and are otherwise negative except as noted above.  Physical Exam    VS:  BP 136/80   Pulse 88  Ht _0  (1.956 m)   Wt (!) 356 lb (161.5 kg)   SpO2 99%   BMI 42.22 kg/m  , BMI Body mass index is 42.22 kg/m. GEN: Well nourished, well developed, in no acute distress. HEENT: normal. Neck: Supple, no JVD, carotid bruits, or masses. Cardiac: RRR, no murmurs, rubs, or gallops. No clubbing, cyanosis, edema.  Radials/DP/PT 2+ and equal bilaterally.  Respiratory:  Respirations regular and unlabored, clear to auscultation bilaterally. GI: Soft, nontender, nondistended, BS + x 4. MS: no deformity or atrophy. Skin: warm and dry, no rash. Neuro:  Strength and sensation are intact. Psych: Normal affect.  Accessory Clinical Findings    Recent Labs: 07/25/2020: Magnesium 2.0 07/27/2020: ALT 73; B Natriuretic Peptide 271.0 08/13/2020: BUN 31; Creatinine, Ser 1.80; Hemoglobin 8.8; Platelets 266; Potassium 4.8; Sodium 143   Recent Lipid Panel    Component Value Date/Time   CHOL 158 07/19/2020 0459   TRIG 187 (H) 07/19/2020 0459   HDL 20 (L) 07/19/2020 0459   CHOLHDL 7.9 07/19/2020 0459    VLDL 37 07/19/2020 0459   LDLCALC 101 (H) 07/19/2020 0459   LDLDIRECT 120.0 05/28/2015 0951    ECG personally reviewed by me today-none today.  Echocardiogram 07/18/2020 IMPRESSIONS    1. Left ventricular ejection fraction, by estimation, is 50 to 55%. The  left ventricle has low normal function. Left ventricular endocardial  border not optimally defined to evaluate regional wall motion. There is  mild left ventricular hypertrophy. Left  ventricular diastolic parameters were normal.  2. Right ventricular systolic function is normal. The right ventricular  size is normal. Tricuspid regurgitation signal is inadequate for assessing  PA pressure.  3. The mitral valve is grossly normal. No evidence of mitral valve  regurgitation.  4. The aortic valve was not well visualized. Aortic valve regurgitation  is not visualized.  5. The inferior vena cava is normal in size with greater than 50%  respiratory variability, suggesting right atrial pressure of 3 mmHg.   Cardiac catheterization 03/21/6833  LV end diastolic pressure is severely elevated -consistent with ACUTE DIASTOLIC HEART FAILURE  Mid LM to Ost LAD lesion is 70% stenosed with 55% stenosed side branch in Ost Cx to Mid Cx.  Mid Cx-1 lesion is 60% stenosed with 50% stenosed side branch in 1st Mrg.  Mid Cx-2 overlapping BMS and DES stent segment is 5% stenosed.  1st Diag lesion is 60% stenosed. 3rd Diag lesion is 60% stenosed.  Prox RCA to Mid RCA stented segment is 25% stenosed.  Dist RCA lesion is 50% stenosed with 65% stenosed side branch in RPDA.   -------------------------------------------- \SUMMARY  Severe distal LEFT MAIN 70% stenosis (highly positive DFR 0.85 and proximal LAD) ? Relatively normal LAD with minimal disease, 1st & 3rd Diag having proximal 50 to 60% stenosis. ? Diffuse 55% calcified proximal to mid LCx with a focal 60% stenosis just prior to overlapping VISION BMS, and RESOLUTE ONYX DES in the main  LCx prior to bifurcation.  Minimal RCA disease with roughly 20% ISR in the stented segment of the RCA.  Ostial PDA 50-60%  LV gram not performed due to severely elevated LVEDP of 33 mmHg -> consistent with ACUTE DIASTOLIC HEART FAILURE (will need echocardiogram to assess EF)   RECOMMENDATIONS:  With severely LM and proximal LCx disease, will refer for CVTS consultation. -->  Plavix has been discontinued  He would likely need several days for Plavix washout -> he will also need aggressive blood pressure management along with diuresis. ?  I have ordered Lasix 40 mg IV twice daily starting this evening.  Restart IV heparin 8 hours after sheath removal.  Continue aggressive CRF Guideline Directed Therapy.  Diagnostic Dominance: Right  Intervention    Assessment & Plan   1.  Coronary artery disease-no chest pain today.    Underwent CABG x4 on 07/24/2020.  Has been increasing his physical activity and cardiac rehab.   Continue atorvastatin, carvedilol, aspirin Heart healthy low-sodium diet Increase physical activity as tolerated  Essential hypertension-BP today  136/80.  Patient does not monitor blood pressure at home.  Also states he does not follow a low-sodium diet and eats convenience foods. Continue atorvastatin, carvedilol Stop midodrine Heart healthy low-sodium diet Increase physical activity as tolerated Keep blood pressure log  Hyperlipidemia-LDL 101 on 07/19/2020. Continue atorvastatin, fenofibrate Heart healthy low-sodium high-fiber diet Increase physical activity as tolerated Repeat lipid and liver  Left fourth toe amputation-healing well.  Evaluated by orthopedics and instructed to keep the wound site dry do to maceration.  Continues with walking boot. Follows with orthopedics   Disposition:  Follow-up with Dr. Oval Linsey in 3 months.   Jossie Ng. Madeliene Tejera NP-C    09/26/2020, 2:15 PM [facility] [place] [address] Office  [phone] Fax [phone]  Notice: This dictation was prepared with Dragon dictation along with smaller phrase technology. Any transcriptional errors that result from this process are unintentional and may not be corrected upon review.

## 2020-09-25 ENCOUNTER — Ambulatory Visit (INDEPENDENT_AMBULATORY_CARE_PROVIDER_SITE_OTHER): Payer: Medicare Other | Admitting: Physician Assistant

## 2020-09-25 ENCOUNTER — Encounter: Payer: Self-pay | Admitting: Physician Assistant

## 2020-09-25 DIAGNOSIS — M869 Osteomyelitis, unspecified: Secondary | ICD-10-CM

## 2020-09-25 MED ORDER — DOXYCYCLINE HYCLATE 100 MG PO TABS
100.0000 mg | ORAL_TABLET | Freq: Two times a day (BID) | ORAL | 0 refills | Status: DC
Start: 2020-09-25 — End: 2020-10-02

## 2020-09-25 NOTE — Progress Notes (Signed)
Office Visit Note   Patient: Joseph [name]           Date of Birth: 30-Jul-1971           MRN: [id number] Visit Date: 09/25/2020              Requested by: Marva Panda, NP [address] [place],  [name] [address] PCP: Marva Panda, NP  Chief Complaint  Patient presents with  . Left Foot - Pain, Follow-up      HPI: The patient is a 49 year old gentleman who is 6 weeks status post left fourth ray amputation.  He has been alternating compression socks he says his wife has changed his dressing daily but admits that she may have missed a few days  Assessment & Plan: Visit Diagnoses: No diagnosis found.  Plan: Demonstrated to patient to pack web space with gauze and overwrapped with an Ace wrap up to his knee.  Should change this every day and wash the foot every day being sure that it is dry.  Will refill doxycycline.  Follow-up in 1 week  Follow-Up Instructions: No follow-ups on file.   Ortho Exam  Patient is alert, oriented, no adenopathy, well-dressed, normal affect, normal respiratory effort. Left foot: Dorsum of foot incision is healed well.  Webspace has wound dehiscence which does probe deeply however when fibrinous tissue is debrided does bleed briskly.  No surrounding cellulitis.  He does have some skin delamination.  He has biphasic pulses by Doppler both posterior tibial and dorsalis pedis.  Imaging: No results found. No images are attached to the encounter.  Labs: Lab Results  Component Value Date   HGBA1C 9.4 (H) 07/18/2020   HGBA1C 8.2 (H) 11/02/2019   HGBA1C 8.2 (H) 11/01/2019   ESRSEDRATE 41 (H) 09/16/2017   CRP 8.9 (H) 09/16/2017   REPTSTATUS 07/23/2020 FINAL 07/18/2020   REPTSTATUS 07/23/2020 FINAL 07/18/2020   CULT  07/18/2020    NO GROWTH 5 DAYS Performed at [facility], [address], [place], [name] [address]    CULT  07/18/2020    NO GROWTH 5 DAYS Performed at [facility], [address] [place], [name] [address]       Lab Results  Component Value Date   ALBUMIN 2.9 (L) 07/27/2020   ALBUMIN 2.9 (L) 07/26/2020   ALBUMIN 3.0 (L) 07/21/2020    Lab Results  Component Value Date   MG 2.0 07/25/2020   MG 2.1 07/25/2020   MG 2.3 07/24/2020   No results found for: VD25OH  No results found for: PREALBUMIN CBC EXTENDED Latest Ref Rng & Units 08/13/2020 08/13/2020 07/29/2020  WBC 4.0 - 10.5 K/uL - 7.5 12.4(H)  RBC 4.22 - 5.81 MIL/uL - 3.23(L) 2.62(L)  HGB 13.0 - 17.0 g/dL 8.1(J) 8.9(L) 7.6(L)  HCT 39 - 52 % 26.0(L) 30.8(L) 24.6(L)  PLT 150 - 400 K/uL - 266 303  NEUTROABS 1.7 - 7.7 K/uL - - -  LYMPHSABS 0.7 - 4.0 K/uL - - -     There is no height or weight on file to calculate BMI.  Orders:  No orders of the defined types were placed in this encounter.  No orders of the defined types were placed in this encounter.    Procedures: No procedures performed  Clinical Data: No additional findings.  ROS:  All other systems negative, except as noted in the HPI. Review of Systems  Objective: Vital Signs: There were no vitals taken for this visit.  Specialty Comments:  No specialty  comments available.  PMFS History: Patient Active Problem List   Diagnosis Date Noted  . Osteomyelitis of fourth toe of right foot (HCC)   . S/P CABG x 4 07/24/2020  . Non-ST elevation (NSTEMI) myocardial infarction (HCC) 07/22/2020  . Acute on chronic diastolic heart failure (HCC) 07/22/2020  . Hypotension 07/18/2020  . Left leg swelling 07/18/2020  . Elevated troponin 07/18/2020  . AKI (acute kidney injury) (HCC) 07/18/2020  . Elevated brain natriuretic peptide (BNP) level 07/18/2020  . GERD (gastroesophageal reflux disease) 07/18/2020  . Severe Sepsis secondary to Lt leg/Foot Cellulitis 07/18/2020  . Severe sepsis --Severe Sepsis secondary to Lt leg/Foot Cellulitis with hypotension and AKI 07/18/2020  . Unstable angina (HCC) 11/01/2019  . Edema 06/13/2019  . Leukocytosis 05/31/2019  . Smoker  03/08/2019  . Chronic pain 03/08/2019  . Chronic back pain 02/16/2019  . Deep venous thrombosis (HCC) 02/16/2019  . Obesity, Class III, BMI 40-49.9 (morbid obesity) (HCC) 02/16/2019  . Peripheral nerve disease 02/16/2019  . COPD (chronic obstructive pulmonary disease) (HCC) 01/22/2019  . Anxiety disorder 03/26/2018  . Open wound of toe 10/12/2017  . Sinusitis 10/12/2017  . Abdominal pain   . Loss of weight   . Diabetic foot infection (HCC) 09/16/2017  . Uncontrolled type 2 diabetes mellitus with hyperglycemia, with long-term current use of insulin (HCC) 09/16/2017  . Cellulitis of right foot   . Chronic ulcer of great toe of right foot (HCC) 09/09/2017  . Recurrent chest pain 07/29/2016  . Hyperglycemia   . Insulin dependent type 2 diabetes mellitus (HCC) 04/29/2015  . OSA (obstructive sleep apnea) 05/08/2013  . History of pulmonary embolus (PE) 04/27/2013  . CAD -S/P PCI 11/02/19  09/10/2008  . Dyslipidemia 09/09/2008   Past Medical History:  Diagnosis Date  . Anxiety   . Arthritis    "knees, shoulders, hips, ankles" (07/29/2016)  . Asthma   . CAD (coronary artery disease)    a. 2017: s/p BMS to distal Cx; b. LHC 05/30/2019: 80% mid, distal RCA s/p DES, 30% narrowing of d LM, widely patent LAD w/ luminal irregularities, widely patent stent in dCX  w/ 90+% stenosis distal to stent beofre small trifurcating obtuse marginal (potentially area of restenosis)  . Chronic bronchitis (HCC)   . Chronic ulcer of right great toe (HCC) 09/19/2017  . COPD (chronic obstructive pulmonary disease) (HCC)   . Depression   . DVT (deep venous thrombosis) (HCC)   . GERD (gastroesophageal reflux disease)   . Hyperlipidemia   . Hypertension   . Myocardial infarction (HCC) 1996   "light one"  . PE (pulmonary embolism) 04/2013   On chronic Xarelto  . Peripheral nerve disease   . Pneumonia "several times"  . Sleep apnea   . Type II diabetes mellitus (HCC)     Family History  Problem Relation  Age of Onset  . Hypertension Brother   . Hypertension Father   . Diabetes Other   . Hyperlipidemia Other     Past Surgical History:  Procedure Laterality Date  . AMPUTATION Right 09/21/2017   Procedure: RIGHT GREAT TOE AMPUTATION, POSSIBLE VAC;  Surgeon: Tarry Kos, MD;  Location: [facility] OR;  Service: Orthopedics;  Laterality: Right;  . AMPUTATION Left 08/13/2020   Procedure: LEFT FOOT 4TH RAY AMPUTATION;  Surgeon: [name], MD;  Location: [facility] OR;  Service: Orthopedics;  Laterality: Left;  . CARDIAC CATHETERIZATION  2006  . CARDIAC CATHETERIZATION  1996   "@ [facility]; when I had my heart attack"  .  CARDIAC CATHETERIZATION N/A 07/29/2016   Procedure: Left Heart Cath and Coronary Angiography;  Surgeon: Runell Gess, MD;  Location: [facility] INVASIVE CV LAB;  Service: Cardiovascular;  Laterality: N/A;  . CARDIAC CATHETERIZATION N/A 07/29/2016   Procedure: Coronary Stent Intervention;  Surgeon: Runell Gess, MD;  Location: [facility] INVASIVE CV LAB;  Service: Cardiovascular;  Laterality: N/A;  . CARPAL TUNNEL RELEASE Bilateral   . CORONARY ANGIOPLASTY WITH STENT PLACEMENT  07/29/2016  . CORONARY ARTERY BYPASS GRAFT N/A 07/24/2020   Procedure: CORONARY ARTERY BYPASS GRAFTING (CABG), ON PUMP, TIMES FOUR, USING LEFT INTERNAL MAMMARY ARTERY AND ENDOSCOPICALLY HARVESTED RIGHT GREATER SAPHENOUS VEIN;  Surgeon: Corliss Skains, MD;  Location: [facility] OR;  Service: Open Heart Surgery;  Laterality: N/A;  FLOW TAC  . CORONARY STENT INTERVENTION N/A 05/30/2019   Procedure: CORONARY STENT INTERVENTION;  Surgeon: Lyn Records, MD;  Location: [facility] INVASIVE CV LAB;  Service: Cardiovascular;  Laterality: N/A;  . CORONARY STENT INTERVENTION N/A 11/02/2019   Procedure: CORONARY STENT INTERVENTION;  Surgeon: Iran Ouch, MD;  [facility] INVASIVE CV LAB;  Service: Cardiovascular;  Laterality: N/A;  . ESOPHAGOGASTRODUODENOSCOPY N/A 09/22/2017   Procedure: ESOPHAGOGASTRODUODENOSCOPY (EGD);  Surgeon: Meryl Dare,  MD;  Location: [facility] ENDOSCOPY;  Service: Endoscopy;  Laterality: N/A;  . INTRAVASCULAR PRESSURE WIRE/FFR STUDY N/A 07/22/2020   Procedure: INTRAVASCULAR PRESSURE WIRE/FFR STUDY;  Surgeon: Marykay Lex, MD;  Location: [facility] INVASIVE CV LAB;  Service: Cardiovascular;  Laterality: N/A;  . KNEE ARTHROSCOPY Bilateral    "2 on left; 1 on the right"  . LEFT HEART CATH AND CORONARY ANGIOGRAPHY N/A 11/02/2019   Procedure: LEFT HEART CATH AND CORONARY ANGIOGRAPHY;  Surgeon: Iran Ouch, MD;  [facility] INVASIVE CV LAB;  Service: Cardiovascular;  Laterality: N/A;  . LEFT HEART CATH AND CORONARY ANGIOGRAPHY N/A 07/22/2020   Procedure: LEFT HEART CATH AND CORONARY ANGIOGRAPHY;  Surgeon: Marykay Lex, MD;  Location: [facility] INVASIVE CV LAB;  Service: Cardiovascular;  Laterality: N/A;  . RIGHT/LEFT HEART CATH AND CORONARY ANGIOGRAPHY N/A 05/30/2019   Procedure: RIGHT/LEFT HEART CATH AND CORONARY ANGIOGRAPHY;  Surgeon: Lyn Records, MD;  [facility] INVASIVE CV LAB;  Service: Cardiovascular;  Laterality: N/A;  . SHOULDER OPEN ROTATOR CUFF REPAIR Bilateral   . TEE WITHOUT CARDIOVERSION N/A 07/24/2020   Procedure: TRANSESOPHAGEAL ECHOCARDIOGRAM (TEE);  Surgeon: Corliss Skains, MD;  Location: [facility] OR;  Service: Open Heart Surgery;  Laterality: N/A;   Social History   Occupational History  . Occupation: disabled     Associate Professor: UNEMPLOYED  Tobacco Use  . Smoking status: Former Smoker    Packs/day: 1.00    Years: 34.00    Pack years: 34.00    Types: Cigarettes    Quit date: 07/12/2020    Years since quitting: 0.2  . Smokeless tobacco: Former Neurosurgeon    Types: Snuff, Chew  Vaping Use  . Vaping Use: Never used  Substance and Sexual Activity  . Alcohol use: Yes    Comment: RARE  . Drug use: No  . Sexual activity: Yes

## 2020-09-26 ENCOUNTER — Encounter: Payer: Self-pay | Admitting: General Practice

## 2020-09-26 ENCOUNTER — Other Ambulatory Visit: Payer: Self-pay

## 2020-09-26 ENCOUNTER — Ambulatory Visit (INDEPENDENT_AMBULATORY_CARE_PROVIDER_SITE_OTHER): Payer: Medicare Other | Admitting: General Practice

## 2020-09-26 VITALS — BP 136/80 | HR 88 | Ht 77.0 in | Wt 356.0 lb

## 2020-09-26 DIAGNOSIS — I10 Essential (primary) hypertension: Secondary | ICD-10-CM

## 2020-09-26 DIAGNOSIS — Z951 Presence of aortocoronary bypass graft: Secondary | ICD-10-CM | POA: Diagnosis not present

## 2020-09-26 DIAGNOSIS — E785 Hyperlipidemia, unspecified: Secondary | ICD-10-CM | POA: Diagnosis not present

## 2020-09-26 DIAGNOSIS — Z89422 Acquired absence of other left toe(s): Secondary | ICD-10-CM | POA: Diagnosis not present

## 2020-09-26 MED ORDER — ASPIRIN 325 MG PO TBEC
325.0000 mg | DELAYED_RELEASE_TABLET | Freq: Every day | ORAL | 0 refills | Status: DC
Start: 2020-09-26 — End: 2020-11-03

## 2020-09-26 NOTE — Patient Instructions (Signed)
Medication Instructions:  STOP MIDODRINE  *If you need a refill on your cardiac medications before your next appointment, please call your pharmacy*  Lab Work:   Testing/Procedures:  NONE    NONE  Special Instructions TAKE AND LOG YOUR BLOOD PRESSURE 1 HOUR AFTER TAKING YOUR MEDICATION AND BRING TO YOUR FOLLOW UP APPOINTMENT  PLEASE READ AND FOLLOW SALTY 6-ATTACHED  Follow-Up: Your next appointment:  3 month(s) In Person with Chilton Si, MD -OR- JESSE CLEAVER, FNP-C   At [facility], you and your health needs are our priority.  As part of our continuing mission to provide you with exceptional heart care, we have created designated Provider Care Teams.  These Care Teams include your primary Cardiologist (physician) and Advanced Practice Providers (APPs -  Physician Assistants and Nurse Practitioners) who all work together to provide you with the care you need, when you need it.

## 2020-10-02 ENCOUNTER — Encounter: Payer: Self-pay | Admitting: Physician Assistant

## 2020-10-02 ENCOUNTER — Ambulatory Visit (INDEPENDENT_AMBULATORY_CARE_PROVIDER_SITE_OTHER): Payer: Medicare Other | Admitting: Physician Assistant

## 2020-10-02 ENCOUNTER — Other Ambulatory Visit: Payer: Self-pay

## 2020-10-02 DIAGNOSIS — M79672 Pain in left foot: Secondary | ICD-10-CM

## 2020-10-02 DIAGNOSIS — M869 Osteomyelitis, unspecified: Secondary | ICD-10-CM

## 2020-10-02 MED ORDER — DOXYCYCLINE HYCLATE 100 MG PO TABS
100.0000 mg | ORAL_TABLET | Freq: Two times a day (BID) | ORAL | 0 refills | Status: DC
Start: 2020-10-02 — End: 2021-02-19

## 2020-10-02 NOTE — Progress Notes (Signed)
Office Visit Note   Patient: Joseph [name]           Date of Birth: 1971-07-18           MRN: [id number] Visit Date: 10/02/2020              Requested by: Marva Panda, NP [address] [place],  [name] [address] PCP: Marva Panda, NP  Chief Complaint  Patient presents with  . Left Foot - Follow-up      HPI: Patient is 2 months status post left fourth ray amputation.  He is having difficulties with healing in the web space.  His wife has been doing daily dressing changes and cleansing's.  He is here for follow-up.  He does say the foot is starting to hurt more.  Assessment & Plan: Visit Diagnoses:  1. Osteomyelitis of fourth toe of right foot (HCC)   2. Pain in left foot     Plan: Patient was seen today directly by Dr. [name] he would like a stat referral to vein and vascular for evaluation of this nonhealing wound with reference to his vascular status.  History of peripheral vascular disease.  He will follow-up with [place] in 2 weeks.  Continue current dressing changes  Follow-Up Instructions: No follow-ups on file.   Ortho Exam  Patient is alert, oriented, no adenopathy, well-dressed, normal affect, normal respiratory effort. Focused examination the dorsum of the wound has healed.  Surgical sutures were removed.  On the plantar surface of the wound he has some superficial wound dehiscence.  In the webspace he has fibrinous tissue and this probes deeply into the webspace.  Some surrounding cellulitis.  By Doppler today he had a biphasic posterior tibial tendon pulses the dorsalis pedis pulse was variable and went from biphasic to absent periodically often was monophasic  Imaging: No results found. No images are attached to the encounter.  Labs: Lab Results  Component Value Date   HGBA1C 9.4 (H) 07/18/2020   HGBA1C 8.2 (H) 11/02/2019   HGBA1C 8.2 (H) 11/01/2019   ESRSEDRATE 41 (H) 09/16/2017   CRP 8.9 (H) 09/16/2017   REPTSTATUS 07/23/2020 FINAL  07/18/2020   REPTSTATUS 07/23/2020 FINAL 07/18/2020   CULT  07/18/2020    NO GROWTH 5 DAYS Performed at [facility], [address] [place], [name] [address]    CULT  07/18/2020    NO GROWTH 5 DAYS Performed at [facility], [address], [place], [name] [address]      Lab Results  Component Value Date   ALBUMIN 2.9 (L) 07/27/2020   ALBUMIN 2.9 (L) 07/26/2020   ALBUMIN 3.0 (L) 07/21/2020    Lab Results  Component Value Date   MG 2.0 07/25/2020   MG 2.1 07/25/2020   MG 2.3 07/24/2020   No results found for: VD25OH  No results found for: PREALBUMIN CBC EXTENDED Latest Ref Rng & Units 08/13/2020 08/13/2020 07/29/2020  WBC 4.0 - 10.5 K/uL - 7.5 12.4(H)  RBC 4.22 - 5.81 MIL/uL - 3.23(L) 2.62(L)  HGB 13.0 - 17.0 g/dL 7.6(H) 8.9(L) 7.6(L)  HCT 39 - 52 % 26.0(L) 30.8(L) 24.6(L)  PLT 150 - 400 K/uL - 266 303  NEUTROABS 1.7 - 7.7 K/uL - - -  LYMPHSABS 0.7 - 4.0 K/uL - - -     There is no height or weight on file to calculate BMI.  Orders:  Orders Placed This Encounter  Procedures  . Ambulatory referral to Vascular Surgery   No orders of the  defined types were placed in this encounter.    Procedures: No procedures performed  Clinical Data: No additional findings.  ROS:  All other systems negative, except as noted in the HPI. Review of Systems  Objective: Vital Signs: There were no vitals taken for this visit.  Specialty Comments:  No specialty comments available.  PMFS History: Patient Active Problem List   Diagnosis Date Noted  . Osteomyelitis of fourth toe of right foot (HCC)   . S/P CABG x 4 07/24/2020  . Non-ST elevation (NSTEMI) myocardial infarction (HCC) 07/22/2020  . Acute on chronic diastolic heart failure (HCC) 07/22/2020  . Hypotension 07/18/2020  . Left leg swelling 07/18/2020  . Elevated troponin 07/18/2020  . AKI (acute kidney injury) (HCC) 07/18/2020  . Elevated brain natriuretic peptide (BNP) level 07/18/2020  . GERD  (gastroesophageal reflux disease) 07/18/2020  . Severe Sepsis secondary to Lt leg/Foot Cellulitis 07/18/2020  . Severe sepsis --Severe Sepsis secondary to Lt leg/Foot Cellulitis with hypotension and AKI 07/18/2020  . Unstable angina (HCC) 11/01/2019  . Edema 06/13/2019  . Leukocytosis 05/31/2019  . Smoker 03/08/2019  . Chronic pain 03/08/2019  . Chronic back pain 02/16/2019  . Deep venous thrombosis (HCC) 02/16/2019  . Obesity, Class III, BMI 40-49.9 (morbid obesity) (HCC) 02/16/2019  . Peripheral nerve disease 02/16/2019  . COPD (chronic obstructive pulmonary disease) (HCC) 01/22/2019  . Anxiety disorder 03/26/2018  . Open wound of toe 10/12/2017  . Sinusitis 10/12/2017  . Abdominal pain   . Loss of weight   . Diabetic foot infection (HCC) 09/16/2017  . Uncontrolled type 2 diabetes mellitus with hyperglycemia, with long-term current use of insulin (HCC) 09/16/2017  . Cellulitis of right foot   . Chronic ulcer of great toe of right foot (HCC) 09/09/2017  . Recurrent chest pain 07/29/2016  . Hyperglycemia   . Insulin dependent type 2 diabetes mellitus (HCC) 04/29/2015  . OSA (obstructive sleep apnea) 05/08/2013  . History of pulmonary embolus (PE) 04/27/2013  . CAD -S/P PCI 11/02/19  09/10/2008  . Dyslipidemia 09/09/2008   Past Medical History:  Diagnosis Date  . Anxiety   . Arthritis    "knees, shoulders, hips, ankles" (07/29/2016)  . Asthma   . CAD (coronary artery disease)    a. 2017: s/p BMS to distal Cx; b. LHC 05/30/2019: 80% mid, distal RCA s/p DES, 30% narrowing of d LM, widely patent LAD w/ luminal irregularities, widely patent stent in dCX  w/ 90+% stenosis distal to stent beofre small trifurcating obtuse marginal (potentially area of restenosis)  . Chronic bronchitis (HCC)   . Chronic ulcer of right great toe (HCC) 09/19/2017  . COPD (chronic obstructive pulmonary disease) (HCC)   . Depression   . DVT (deep venous thrombosis) (HCC)   . GERD (gastroesophageal reflux  disease)   . Hyperlipidemia   . Hypertension   . Myocardial infarction (HCC) 1996   "light one"  . PE (pulmonary embolism) 04/2013   On chronic Xarelto  . Peripheral nerve disease   . Pneumonia "several times"  . Sleep apnea   . Type II diabetes mellitus (HCC)     Family History  Problem Relation Age of Onset  . Hypertension Brother   . Hypertension Father   . Diabetes Other   . Hyperlipidemia Other     Past Surgical History:  Procedure Laterality Date  . AMPUTATION Right 09/21/2017   Procedure: RIGHT GREAT TOE AMPUTATION, POSSIBLE VAC;  Surgeon: Tarry Kos, MD;  Location: [facility] OR;  Service: Orthopedics;  Laterality: Right;  . AMPUTATION Left 08/13/2020   Procedure: LEFT FOOT 4TH RAY AMPUTATION;  Surgeon: [name], MD;  Location: [facility] OR;  Service: Orthopedics;  Laterality: Left;  . CARDIAC CATHETERIZATION  2006  . CARDIAC CATHETERIZATION  1996   "@ [facility]; when I had my heart attack"  . CARDIAC CATHETERIZATION N/A 07/29/2016   Procedure: Left Heart Cath and Coronary Angiography;  Surgeon: Runell Gess, MD;  Location: [facility] INVASIVE CV LAB;  Service: Cardiovascular;  Laterality: N/A;  . CARDIAC CATHETERIZATION N/A 07/29/2016   Procedure: Coronary Stent Intervention;  Surgeon: Runell Gess, MD;  Location: [facility] INVASIVE CV LAB;  Service: Cardiovascular;  Laterality: N/A;  . CARPAL TUNNEL RELEASE Bilateral   . CORONARY ANGIOPLASTY WITH STENT PLACEMENT  07/29/2016  . CORONARY ARTERY BYPASS GRAFT N/A 07/24/2020   Procedure: CORONARY ARTERY BYPASS GRAFTING (CABG), ON PUMP, TIMES FOUR, USING LEFT INTERNAL MAMMARY ARTERY AND ENDOSCOPICALLY HARVESTED RIGHT GREATER SAPHENOUS VEIN;  Surgeon: Corliss Skains, MD;  Location: [facility] OR;  Service: Open Heart Surgery;  Laterality: N/A;  FLOW TAC  . CORONARY STENT INTERVENTION N/A 05/30/2019   Procedure: CORONARY STENT INTERVENTION;  Surgeon: Lyn Records, MD;  Location: [facility] INVASIVE CV LAB;  Service: Cardiovascular;  Laterality: N/A;  .  CORONARY STENT INTERVENTION N/A 11/02/2019   Procedure: CORONARY STENT INTERVENTION;  Surgeon: Iran Ouch, MD;  Location: [facility] INVASIVE CV LAB;  Service: Cardiovascular;  Laterality: N/A;  . ESOPHAGOGASTRODUODENOSCOPY N/A 09/22/2017   Procedure: ESOPHAGOGASTRODUODENOSCOPY (EGD);  Surgeon: Meryl Dare, MD;  Location: [facility] ENDOSCOPY;  Service: Endoscopy;  Laterality: N/A;  . INTRAVASCULAR PRESSURE WIRE/FFR STUDY N/A 07/22/2020   Procedure: INTRAVASCULAR PRESSURE WIRE/FFR STUDY;  Surgeon: Marykay Lex, MD;  Location: [facility] INVASIVE CV LAB;  Service: Cardiovascular;  Laterality: N/A;  . KNEE ARTHROSCOPY Bilateral    "2 on left; 1 on the right"  . LEFT HEART CATH AND CORONARY ANGIOGRAPHY N/A 11/02/2019   Procedure: LEFT HEART CATH AND CORONARY ANGIOGRAPHY;  Surgeon: Iran Ouch, MD;  Location: [facility] INVASIVE CV LAB;  Service: Cardiovascular;  Laterality: N/A;  . LEFT HEART CATH AND CORONARY ANGIOGRAPHY N/A 07/22/2020   Procedure: LEFT HEART CATH AND CORONARY ANGIOGRAPHY;  Surgeon: Marykay Lex, MD;  Location: [facility] INVASIVE CV LAB;  Service: Cardiovascular;  Laterality: N/A;  . RIGHT/LEFT HEART CATH AND CORONARY ANGIOGRAPHY N/A 05/30/2019   Procedure: RIGHT/LEFT HEART CATH AND CORONARY ANGIOGRAPHY;  Surgeon: Lyn Records, MD;  Location: [facility] INVASIVE CV LAB;  Service: Cardiovascular;  Laterality: N/A;  . SHOULDER OPEN ROTATOR CUFF REPAIR Bilateral   . TEE WITHOUT CARDIOVERSION N/A 07/24/2020   Procedure: TRANSESOPHAGEAL ECHOCARDIOGRAM (TEE);  Surgeon: Corliss Skains, MD;  Location: [facility] OR;  Service: Open Heart Surgery;  Laterality: N/A;   Social History   Occupational History  . Occupation: disabled     Associate Professor: UNEMPLOYED  Tobacco Use  . Smoking status: Former Smoker    Packs/day: 1.00    Years: 34.00    Pack years: 34.00    Types: Cigarettes    Quit date: 07/12/2020    Years since quitting: 0.2  . Smokeless tobacco: Former Neurosurgeon    Types: Snuff, Chew  Vaping Use  . Vaping Use:  Never used  Substance and Sexual Activity  . Alcohol use: Yes    Comment: RARE  . Drug use: No  . Sexual activity: Yes

## 2020-10-03 ENCOUNTER — Other Ambulatory Visit: Payer: Self-pay

## 2020-10-03 ENCOUNTER — Other Ambulatory Visit: Payer: Self-pay | Admitting: *Deleted

## 2020-10-03 DIAGNOSIS — I739 Peripheral vascular disease, unspecified: Secondary | ICD-10-CM

## 2020-10-06 ENCOUNTER — Other Ambulatory Visit: Payer: Self-pay

## 2020-10-06 ENCOUNTER — Ambulatory Visit (HOSPITAL_COMMUNITY)
Admission: RE | Admit: 2020-10-06 | Discharge: 2020-10-06 | Disposition: A | Discharge status: Home / Self Care | Payer: Medicare Other | Source: Ambulatory Visit | Attending: Vascular Surgery | Admitting: Vascular Surgery

## 2020-10-06 ENCOUNTER — Encounter: Payer: Self-pay | Admitting: Vascular Surgery

## 2020-10-06 ENCOUNTER — Ambulatory Visit (INDEPENDENT_AMBULATORY_CARE_PROVIDER_SITE_OTHER): Payer: Medicare Other | Admitting: Vascular Surgery

## 2020-10-06 VITALS — BP 104/72 | HR 90 | Temp 96.0°F | Resp 18 | Ht 77.0 in | Wt 362.0 lb

## 2020-10-06 DIAGNOSIS — I739 Peripheral vascular disease, unspecified: Secondary | ICD-10-CM

## 2020-10-06 DIAGNOSIS — I2510 Atherosclerotic heart disease of native coronary artery without angina pectoris: Secondary | ICD-10-CM

## 2020-10-06 NOTE — Progress Notes (Signed)
[facility] [place]  Patient name: Joseph [name] MRN: [id number] DOB: 13-Sep-1971 Sex: male  REASON FOR CONSULT: Evaluation lower extremity arterial flow with nonhealing left fourth toe amputation  HPI: Joseph [name] is a 49 y.o. male, here today for evaluation.  He has a very chronic past medical history and his age of 79.  His wife is here who provides many of his details for his history.  He had long history of coronary artery disease with stenting and eventual coronary artery bypass grafting in August 2021.  He also has a history of infection in his great toe and underwent primary great toe amputation with primary healing in 2018.  He has developed gangrene of his left toe and underwent amputation of his left fourth toe by Dr. Sharol Given 2 months ago.  He has had poor healing of this and there is been discussion of more extensive amputation and possible transmetatarsal amputation.  Has a long history of diabetes as well.  Past Medical History:  Diagnosis Date  . Anxiety   . Arthritis    "knees, shoulders, hips, ankles" (07/29/2016)  . Asthma   . CAD (coronary artery disease)    a. 2017: s/p BMS to distal Cx; b. LHC 05/30/2019: 80% mid, distal RCA s/p DES, 30% narrowing of d LM, widely patent LAD w/ luminal irregularities, widely patent stent in [place]  w/ 90+% stenosis distal to stent beofre small trifurcating obtuse marginal (potentially area of restenosis)  . Chronic bronchitis ([place])   . Chronic ulcer of right great toe ([place]) 09/19/2017  . COPD (chronic obstructive pulmonary disease) ([place])   . Depression   . DVT (deep venous thrombosis) ([place])   . GERD (gastroesophageal reflux disease)   . Hyperlipidemia   . Hypertension   . Myocardial infarction ([place]) 1996   "light one"  . PE (pulmonary embolism) 04/2013   On chronic Xarelto  . Peripheral nerve disease   . Pneumonia "several times"  . Sleep apnea   . Type II diabetes mellitus ([facility])     Family History    Problem Relation Age of Onset  . Hypertension Brother   . Hypertension Father   . Diabetes Other   . Hyperlipidemia Other     SOCIAL HISTORY: Social History   Socioeconomic History  . Marital status: Married    Spouse name: Not on file  . Number of children: Not on file  . Years of education: Not on file  . Highest education level: Not on file  Occupational History  . Occupation: disabled     Fish farm manager: UNEMPLOYED  Tobacco Use  . Smoking status: Former Smoker    Packs/day: 1.00    Years: 34.00    Pack years: 34.00    Types: Cigarettes    Quit date: 07/12/2020    Years since quitting: 0.2  . Smokeless tobacco: Former Systems developer    Types: Snuff, Chew  Vaping Use  . Vaping Use: Never used  Substance and Sexual Activity  . Alcohol use: Yes    Comment: RARE  . Drug use: No  . Sexual activity: Yes  Other Topics Concern  . Not on file  Social History Narrative  . Not on file   Social Determinants of Health   Financial Resource Strain:   . Difficulty of Paying Living Expenses: Not on file  Food Insecurity:   . Worried About Charity fundraiser in the Last Year: Not on file  . Ran Out  of Food in the Last Year: Not on file  Transportation Needs:   . Lack of Transportation (Medical): Not on file  . Lack of Transportation (Non-Medical): Not on file  Physical Activity:   . Days of Exercise per Week: Not on file  . Minutes of Exercise per Session: Not on file  Stress:   . Feeling of Stress : Not on file  Social Connections:   . Frequency of Communication with Friends and Family: Not on file  . Frequency of Social Gatherings with Friends and Family: Not on file  . Attends Religious Services: Not on file  . Active Member of Clubs or Organizations: Not on file  . Attends Archivist Meetings: Not on file  . Marital Status: Not on file  Intimate Partner Violence:   . Fear of Current or Ex-Partner: Not on file  . Emotionally Abused: Not on file  . Physically Abused:  Not on file  . Sexually Abused: Not on file    Allergies  Allergen Reactions  . Sulfa Antibiotics Other (See Comments)    headache    Current Outpatient Medications  Medication Sig Dispense Refill  . albuterol (PROAIR HFA) 108 (90 BASE) MCG/ACT inhaler Inhale 2 puffs into the lungs every 6 (six) hours as needed for wheezing or shortness of breath.     . ALPRAZolam (XANAX) 1 MG tablet Take 1-2 mg by mouth 4 (four) times daily as needed (anxiety).     Marland Kitchen aspirin 325 MG EC tablet Take 1 tablet (325 mg total) by mouth daily. 30 tablet 0  . atorvastatin (LIPITOR) 80 MG tablet Take 1 tablet (80 mg total) by mouth daily at 6 PM. 30 tablet 2  . blood glucose meter kit and supplies KIT Dispense based on patient and insurance preference. Use up to four times daily as directed. (FOR ICD-9 250.00, 250.01). 1 each 0  . buPROPion (WELLBUTRIN SR) 150 MG 12 hr tablet Take 150 mg by mouth 2 (two) times daily.   0  . carvedilol (COREG) 3.125 MG tablet TAKE 1 TABLET(3.125 MG) BY MOUTH TWICE DAILY 180 tablet 1  . diphenhydrAMINE (BENADRYL) 25 mg capsule Take 50 mg by mouth every 6 (six) hours as needed for allergies.    Marland Kitchen doxycycline (VIBRA-TABS) 100 MG tablet Take 1 tablet (100 mg total) by mouth 2 (two) times daily. 60 tablet 0  . escitalopram (LEXAPRO) 10 MG tablet Take 10 mg by mouth daily.    . fenofibrate (TRICOR) 145 MG tablet Take 145 mg by mouth daily.     . fluconazole (DIFLUCAN) 100 MG tablet Take 100 mg by mouth every Monday.    . fluticasone (FLONASE) 50 MCG/ACT nasal spray Place 2 sprays into both nostrils daily as needed for allergies.    Marland Kitchen HUMALOG KWIKPEN 100 UNIT/ML SOPN Inject 15-40 Units into the skin 3 (three) times daily with meals. sliding scale    . LANTUS SOLOSTAR 100 UNIT/ML Solostar Pen Inject 40 Units into the skin 2 (two) times daily. 15 mL 11  . meclizine (ANTIVERT) 25 MG tablet Take 25-50 mg by mouth See admin instructions. Take 2 tablets (50 mg) by mouth in the morning & take 1  tablet (25 mg) by mouth at night.    . metFORMIN (GLUCOPHAGE) 1000 MG tablet Take 1 tablet (1,000 mg total) by mouth 2 (two) times daily with a meal.    . oxyCODONE (ROXICODONE) 15 MG immediate release tablet Take 15 mg by mouth 5 (five) times daily.     Marland Kitchen  pantoprazole (PROTONIX) 40 MG tablet Take 1 tablet (40 mg total) by mouth daily. (Patient taking differently: Take 80 mg by mouth daily. ) 30 tablet 0  . pregabalin (LYRICA) 150 MG capsule Take 150 mg by mouth 3 (three) times daily.     Marland Kitchen VICTOZA 18 MG/3ML SOPN Inject 1.2 mg into the skin at bedtime.     No current facility-administered medications for this visit.    REVIEW OF SYSTEMS:  _0  denotes positive finding, _1  denotes negative finding Cardiac  Comments:  Chest pain or chest pressure: x   Shortness of breath upon exertion: x   Short of breath when lying flat: x   Irregular heart rhythm:        Vascular    Pain in calf, thigh, or hip brought on by ambulation: x   Pain in feet at night that wakes you up from your sleep:  x   Blood clot in your veins:    Leg swelling:  x       Pulmonary    Oxygen at home:    Productive cough:     Wheezing:         Neurologic    Sudden weakness in arms or legs:  x   Sudden numbness in arms or legs:  x   Sudden onset of difficulty speaking or slurred speech: x   Temporary loss of vision in one eye:  x   Problems with dizziness:  x       Gastrointestinal    Blood in stool:  x   Vomited blood:         Genitourinary    Burning when urinating:     Blood in urine:        Psychiatric    Major depression:  x       Hematologic    Bleeding problems:    Problems with blood clotting too easily:        Skin    Rashes or ulcers:        Constitutional    Fever or chills:      PHYSICAL EXAM: Vitals:   10/06/20 1512  BP: 104/72  Pulse: 90  Resp: 18  Temp: (!) 96 F (35.6 C)  TempSrc: Other (Comment)  SpO2: 96%  Weight: (!) 362 lb (164.2 kg)  Height: _2  (1.956 m)     GENERAL: The patient is a well-nourished male, in no acute distress. The vital signs are documented above. CARDIAC: There is a regular rate and rhythm.  VASCULAR: I do not palpate popliteal or distal pulses bilaterally PULMONARY: There is good air exchange bilaterally without wheezing or rales. ABDOMEN: Soft and non-tender with normal pitched bowel sounds.  MUSCULOSKELETAL: There are no major deformities or cyanosis. NEUROLOGIC: No focal weakness or paresthesias are detected. SKIN: He has a well-healed right great toe rotation on the right.  Has a vein harvest incision at the level of his right knee.  On the left he has open ray amputation of his left fourth toe.  There is erythema extending onto the dorsum of his foot PSYCHIATRIC: The patient has a normal affect.  DATA:   Noninvasive studies from today were reviewed with the patient and his wife.  Ankle arm index on the right is 0.84 on the left 0.75.  On listening with hand-held Doppler he does have what appears to be biphasic flow at the left posterior tibial but monophasic flow in the dorsalis pedis and peroneal.  MEDICAL  ISSUES:  Marginal flow to the left foot for healing.  He is now 2 months out with continued nonhealing left fourth toe ray amputation.  His most recent creatinine in the [facility] system on 08/13/2020 was 1.8.  I have recommended arteriography to assure that he has optimization of flow for healing and prior to any revision of his amputation.  We will schedule this either this week or Ebenezer Mccaskey next week.  Will plan interventional based on this study   Curt Jews [facility] phone [phone]

## 2020-10-06 NOTE — H&P (View-Only) (Signed)
[facility] [place]  Patient name: Joseph [name] MRN: [id number] DOB: 13-Sep-1971 Sex: male  REASON FOR CONSULT: Evaluation lower extremity arterial flow with nonhealing left fourth toe amputation  HPI: Joseph [name] is a 49 y.o. male, here today for evaluation.  He has a very chronic past medical history and his age of 79.  His wife is here who provides many of his details for his history.  He had long history of coronary artery disease with stenting and eventual coronary artery bypass grafting in August 2021.  He also has a history of infection in his great toe and underwent primary great toe amputation with primary healing in 2018.  He has developed gangrene of his left toe and underwent amputation of his left fourth toe by Dr. Sharol Given 2 months ago.  He has had poor healing of this and there is been discussion of more extensive amputation and possible transmetatarsal amputation.  Has a long history of diabetes as well.  Past Medical History:  Diagnosis Date  . Anxiety   . Arthritis    "knees, shoulders, hips, ankles" (07/29/2016)  . Asthma   . CAD (coronary artery disease)    a. 2017: s/p BMS to distal Cx; b. LHC 05/30/2019: 80% mid, distal RCA s/p DES, 30% narrowing of d LM, widely patent LAD w/ luminal irregularities, widely patent stent in [place]  w/ 90+% stenosis distal to stent beofre small trifurcating obtuse marginal (potentially area of restenosis)  . Chronic bronchitis ([place])   . Chronic ulcer of right great toe ([place]) 09/19/2017  . COPD (chronic obstructive pulmonary disease) ([place])   . Depression   . DVT (deep venous thrombosis) ([place])   . GERD (gastroesophageal reflux disease)   . Hyperlipidemia   . Hypertension   . Myocardial infarction ([place]) 1996   "light one"  . PE (pulmonary embolism) 04/2013   On chronic Xarelto  . Peripheral nerve disease   . Pneumonia "several times"  . Sleep apnea   . Type II diabetes mellitus ([facility])     Family History    Problem Relation Age of Onset  . Hypertension Brother   . Hypertension Father   . Diabetes Other   . Hyperlipidemia Other     SOCIAL HISTORY: Social History   Socioeconomic History  . Marital status: Married    Spouse name: Not on file  . Number of children: Not on file  . Years of education: Not on file  . Highest education level: Not on file  Occupational History  . Occupation: disabled     Fish farm manager: UNEMPLOYED  Tobacco Use  . Smoking status: Former Smoker    Packs/day: 1.00    Years: 34.00    Pack years: 34.00    Types: Cigarettes    Quit date: 07/12/2020    Years since quitting: 0.2  . Smokeless tobacco: Former Systems developer    Types: Snuff, Chew  Vaping Use  . Vaping Use: Never used  Substance and Sexual Activity  . Alcohol use: Yes    Comment: RARE  . Drug use: No  . Sexual activity: Yes  Other Topics Concern  . Not on file  Social History Narrative  . Not on file   Social Determinants of Health   Financial Resource Strain:   . Difficulty of Paying Living Expenses: Not on file  Food Insecurity:   . Worried About Charity fundraiser in the Last Year: Not on file  . Ran Out  of Food in the Last Year: Not on file  Transportation Needs:   . Lack of Transportation (Medical): Not on file  . Lack of Transportation (Non-Medical): Not on file  Physical Activity:   . Days of Exercise per Week: Not on file  . Minutes of Exercise per Session: Not on file  Stress:   . Feeling of Stress : Not on file  Social Connections:   . Frequency of Communication with Friends and Family: Not on file  . Frequency of Social Gatherings with Friends and Family: Not on file  . Attends Religious Services: Not on file  . Active Member of Clubs or Organizations: Not on file  . Attends Archivist Meetings: Not on file  . Marital Status: Not on file  Intimate Partner Violence:   . Fear of Current or Ex-Partner: Not on file  . Emotionally Abused: Not on file  . Physically Abused:  Not on file  . Sexually Abused: Not on file    Allergies  Allergen Reactions  . Sulfa Antibiotics Other (See Comments)    headache    Current Outpatient Medications  Medication Sig Dispense Refill  . albuterol (PROAIR HFA) 108 (90 BASE) MCG/ACT inhaler Inhale 2 puffs into the lungs every 6 (six) hours as needed for wheezing or shortness of breath.     . ALPRAZolam (XANAX) 1 MG tablet Take 1-2 mg by mouth 4 (four) times daily as needed (anxiety).     Marland Kitchen aspirin 325 MG EC tablet Take 1 tablet (325 mg total) by mouth daily. 30 tablet 0  . atorvastatin (LIPITOR) 80 MG tablet Take 1 tablet (80 mg total) by mouth daily at 6 PM. 30 tablet 2  . blood glucose meter kit and supplies KIT Dispense based on patient and insurance preference. Use up to four times daily as directed. (FOR ICD-9 250.00, 250.01). 1 each 0  . buPROPion (WELLBUTRIN SR) 150 MG 12 hr tablet Take 150 mg by mouth 2 (two) times daily.   0  . carvedilol (COREG) 3.125 MG tablet TAKE 1 TABLET(3.125 MG) BY MOUTH TWICE DAILY 180 tablet 1  . diphenhydrAMINE (BENADRYL) 25 mg capsule Take 50 mg by mouth every 6 (six) hours as needed for allergies.    Marland Kitchen doxycycline (VIBRA-TABS) 100 MG tablet Take 1 tablet (100 mg total) by mouth 2 (two) times daily. 60 tablet 0  . escitalopram (LEXAPRO) 10 MG tablet Take 10 mg by mouth daily.    . fenofibrate (TRICOR) 145 MG tablet Take 145 mg by mouth daily.     . fluconazole (DIFLUCAN) 100 MG tablet Take 100 mg by mouth every Monday.    . fluticasone (FLONASE) 50 MCG/ACT nasal spray Place 2 sprays into both nostrils daily as needed for allergies.    Marland Kitchen HUMALOG KWIKPEN 100 UNIT/ML SOPN Inject 15-40 Units into the skin 3 (three) times daily with meals. sliding scale    . LANTUS SOLOSTAR 100 UNIT/ML Solostar Pen Inject 40 Units into the skin 2 (two) times daily. 15 mL 11  . meclizine (ANTIVERT) 25 MG tablet Take 25-50 mg by mouth See admin instructions. Take 2 tablets (50 mg) by mouth in the morning & take 1  tablet (25 mg) by mouth at night.    . metFORMIN (GLUCOPHAGE) 1000 MG tablet Take 1 tablet (1,000 mg total) by mouth 2 (two) times daily with a meal.    . oxyCODONE (ROXICODONE) 15 MG immediate release tablet Take 15 mg by mouth 5 (five) times daily.     Marland Kitchen  pantoprazole (PROTONIX) 40 MG tablet Take 1 tablet (40 mg total) by mouth daily. (Patient taking differently: Take 80 mg by mouth daily. ) 30 tablet 0  . pregabalin (LYRICA) 150 MG capsule Take 150 mg by mouth 3 (three) times daily.     . VICTOZA 18 MG/3ML SOPN Inject 1.2 mg into the skin at bedtime.     No current facility-administered medications for this visit.    REVIEW OF SYSTEMS:  [X] denotes positive finding, [ ] denotes negative finding Cardiac  Comments:  Chest pain or chest pressure: x   Shortness of breath upon exertion: x   Short of breath when lying flat: x   Irregular heart rhythm:        Vascular    Pain in calf, thigh, or hip brought on by ambulation: x   Pain in feet at night that wakes you up from your sleep:  x   Blood clot in your veins:    Leg swelling:  x       Pulmonary    Oxygen at home:    Productive cough:     Wheezing:         Neurologic    Sudden weakness in arms or legs:  x   Sudden numbness in arms or legs:  x   Sudden onset of difficulty speaking or slurred speech: x   Temporary loss of vision in one eye:  x   Problems with dizziness:  x       Gastrointestinal    Blood in stool:  x   Vomited blood:         Genitourinary    Burning when urinating:     Blood in urine:        Psychiatric    Major depression:  x       Hematologic    Bleeding problems:    Problems with blood clotting too easily:        Skin    Rashes or ulcers:        Constitutional    Fever or chills:      PHYSICAL EXAM: Vitals:   10/06/20 1512  BP: 104/72  Pulse: 90  Resp: 18  Temp: (!) 96 F (35.6 C)  TempSrc: Other (Comment)  SpO2: 96%  Weight: (!) 362 lb (164.2 kg)  Height: 6' 5" (1.956 m)     GENERAL: The patient is a well-nourished male, in no acute distress. The vital signs are documented above. CARDIAC: There is a regular rate and rhythm.  VASCULAR: I do not palpate popliteal or distal pulses bilaterally PULMONARY: There is good air exchange bilaterally without wheezing or rales. ABDOMEN: Soft and non-tender with normal pitched bowel sounds.  MUSCULOSKELETAL: There are no major deformities or cyanosis. NEUROLOGIC: No focal weakness or paresthesias are detected. SKIN: He has a well-healed right great toe rotation on the right.  Has a vein harvest incision at the level of his right knee.  On the left he has open ray amputation of his left fourth toe.  There is erythema extending onto the dorsum of his foot PSYCHIATRIC: The patient has a normal affect.  DATA:   Noninvasive studies from today were reviewed with the patient and his wife.  Ankle arm index on the right is 0.84 on the left 0.75.  On listening with hand-held Doppler he does have what appears to be biphasic flow at the left posterior tibial but monophasic flow in the dorsalis pedis and peroneal.  MEDICAL   ISSUES:  Marginal flow to the left foot for healing.  He is now 2 months out with continued nonhealing left fourth toe ray amputation.  His most recent creatinine in the [facility] system on 08/13/2020 was 1.8.  I have recommended arteriography to assure that he has optimization of flow for healing and prior to any revision of his amputation.  We will schedule this either this week or Eligah Anello next week.  Will plan interventional based on this study   Curt Jews [facility] phone [phone]

## 2020-10-07 ENCOUNTER — Other Ambulatory Visit: Payer: Self-pay

## 2020-10-09 ENCOUNTER — Other Ambulatory Visit
Admission: RE | Admit: 2020-10-09 | Discharge: 2020-10-09 | Disposition: A | Discharge status: Home / Self Care | Payer: Medicare Other | Source: Ambulatory Visit | Attending: Vascular Surgery | Admitting: Vascular Surgery

## 2020-10-09 ENCOUNTER — Other Ambulatory Visit: Payer: Self-pay

## 2020-10-09 DIAGNOSIS — Z01812 Encounter for preprocedural laboratory examination: Secondary | ICD-10-CM | POA: Insufficient documentation

## 2020-10-09 DIAGNOSIS — Z20822 Contact with and (suspected) exposure to covid-19: Secondary | ICD-10-CM | POA: Diagnosis not present

## 2020-10-10 ENCOUNTER — Other Ambulatory Visit: Payer: Self-pay

## 2020-10-10 ENCOUNTER — Encounter (HOSPITAL_COMMUNITY)
Admission: RE | Disposition: A | Discharge status: Home / Self Care | Payer: Self-pay | Source: Home / Self Care | Attending: Vascular Surgery

## 2020-10-10 ENCOUNTER — Observation Stay (HOSPITAL_COMMUNITY)
Admission: RE | Admit: 2020-10-10 | Discharge: 2020-10-11 | Disposition: A | Discharge status: Home / Self Care | Payer: Medicare Other | Attending: Vascular Surgery | Admitting: Vascular Surgery

## 2020-10-10 DIAGNOSIS — Z87891 Personal history of nicotine dependence: Secondary | ICD-10-CM | POA: Diagnosis not present

## 2020-10-10 DIAGNOSIS — I70245 Atherosclerosis of native arteries of left leg with ulceration of other part of foot: Secondary | ICD-10-CM | POA: Insufficient documentation

## 2020-10-10 DIAGNOSIS — Z86718 Personal history of other venous thrombosis and embolism: Secondary | ICD-10-CM | POA: Insufficient documentation

## 2020-10-10 DIAGNOSIS — L97529 Non-pressure chronic ulcer of other part of left foot with unspecified severity: Secondary | ICD-10-CM | POA: Insufficient documentation

## 2020-10-10 DIAGNOSIS — Z79899 Other long term (current) drug therapy: Secondary | ICD-10-CM | POA: Insufficient documentation

## 2020-10-10 DIAGNOSIS — I10 Essential (primary) hypertension: Secondary | ICD-10-CM | POA: Diagnosis not present

## 2020-10-10 DIAGNOSIS — I2510 Atherosclerotic heart disease of native coronary artery without angina pectoris: Secondary | ICD-10-CM | POA: Diagnosis not present

## 2020-10-10 DIAGNOSIS — Z89411 Acquired absence of right great toe: Secondary | ICD-10-CM | POA: Diagnosis not present

## 2020-10-10 DIAGNOSIS — Z882 Allergy status to sulfonamides status: Secondary | ICD-10-CM | POA: Insufficient documentation

## 2020-10-10 DIAGNOSIS — Z8249 Family history of ischemic heart disease and other diseases of the circulatory system: Secondary | ICD-10-CM | POA: Insufficient documentation

## 2020-10-10 DIAGNOSIS — Z7984 Long term (current) use of oral hypoglycemic drugs: Secondary | ICD-10-CM | POA: Insufficient documentation

## 2020-10-10 DIAGNOSIS — E1151 Type 2 diabetes mellitus with diabetic peripheral angiopathy without gangrene: Secondary | ICD-10-CM | POA: Diagnosis present

## 2020-10-10 DIAGNOSIS — Z86711 Personal history of pulmonary embolism: Secondary | ICD-10-CM | POA: Insufficient documentation

## 2020-10-10 DIAGNOSIS — X58XXXA Exposure to other specified factors, initial encounter: Secondary | ICD-10-CM | POA: Insufficient documentation

## 2020-10-10 DIAGNOSIS — Z833 Family history of diabetes mellitus: Secondary | ICD-10-CM | POA: Insufficient documentation

## 2020-10-10 DIAGNOSIS — I739 Peripheral vascular disease, unspecified: Secondary | ICD-10-CM | POA: Diagnosis present

## 2020-10-10 DIAGNOSIS — Z7982 Long term (current) use of aspirin: Secondary | ICD-10-CM | POA: Insufficient documentation

## 2020-10-10 DIAGNOSIS — J45909 Unspecified asthma, uncomplicated: Secondary | ICD-10-CM | POA: Insufficient documentation

## 2020-10-10 DIAGNOSIS — Z955 Presence of coronary angioplasty implant and graft: Secondary | ICD-10-CM | POA: Insufficient documentation

## 2020-10-10 DIAGNOSIS — E119 Type 2 diabetes mellitus without complications: Secondary | ICD-10-CM | POA: Insufficient documentation

## 2020-10-10 DIAGNOSIS — E11621 Type 2 diabetes mellitus with foot ulcer: Secondary | ICD-10-CM | POA: Insufficient documentation

## 2020-10-10 DIAGNOSIS — S91302D Unspecified open wound, left foot, subsequent encounter: Secondary | ICD-10-CM | POA: Insufficient documentation

## 2020-10-10 HISTORY — PX: ABDOMINAL AORTOGRAM W/LOWER EXTREMITY: CATH118223

## 2020-10-10 HISTORY — PX: PERIPHERAL VASCULAR INTERVENTION: CATH118257

## 2020-10-10 LAB — GLUCOSE, CAPILLARY: Glucose-Capillary: 127 mg/dL — ABNORMAL HIGH (ref 70–99)

## 2020-10-10 LAB — POCT I-STAT, CHEM 8
BUN: 29 mg/dL — ABNORMAL HIGH (ref 6–20)
Calcium, Ion: 1.21 mmol/L (ref 1.15–1.40)
Chloride: 108 mmol/L (ref 98–111)
Creatinine, Ser: 1.2 mg/dL (ref 0.61–1.24)
Glucose, Bld: 115 mg/dL — ABNORMAL HIGH (ref 70–99)
HCT: 35 % — ABNORMAL LOW (ref 39.0–52.0)
Hemoglobin: 11.9 g/dL — ABNORMAL LOW (ref 13.0–17.0)
Potassium: 4.7 mmol/L (ref 3.5–5.1)
Sodium: 145 mmol/L (ref 135–145)
TCO2: 25 mmol/L (ref 22–32)

## 2020-10-10 LAB — POCT ACTIVATED CLOTTING TIME: Activated Clotting Time: 180 seconds

## 2020-10-10 LAB — SARS CORONAVIRUS 2 (TAT 6-24 HRS): SARS Coronavirus 2: NEGATIVE

## 2020-10-10 SURGERY — ABDOMINAL AORTOGRAM W/LOWER EXTREMITY
Anesthesia: LOCAL

## 2020-10-10 MED ORDER — MIDAZOLAM HCL 2 MG/2ML IJ SOLN
INTRAMUSCULAR | Status: AC
  Filled 2020-10-10: qty 2

## 2020-10-10 MED ORDER — INSULIN ASPART 100 UNIT/ML ~~LOC~~ SOLN: 0.0000 [IU] | Freq: Three times a day (TID) | SUBCUTANEOUS | Status: DC

## 2020-10-10 MED ORDER — HEPARIN SODIUM (PORCINE) 1000 UNIT/ML IJ SOLN
INTRAMUSCULAR | Status: AC
  Filled 2020-10-10: qty 1

## 2020-10-10 MED ORDER — ACETAMINOPHEN 325 MG PO TABS: 650.0000 mg | ORAL_TABLET | ORAL | Status: DC | PRN

## 2020-10-10 MED ORDER — FLUTICASONE PROPIONATE 50 MCG/ACT NA SUSP
2.0000 | Freq: Every day | NASAL | Status: DC | PRN
  Filled 2020-10-10: qty 16

## 2020-10-10 MED ORDER — LABETALOL HCL 5 MG/ML IV SOLN: 10.0000 mg | INTRAVENOUS | Status: DC | PRN

## 2020-10-10 MED ORDER — SODIUM CHLORIDE 0.9 % IV SOLN: INTRAVENOUS | Status: DC

## 2020-10-10 MED ORDER — LIDOCAINE HCL (PF) 1 % IJ SOLN
INTRAMUSCULAR | Status: DC | PRN
  Administered 2020-10-10: 20 mL via INTRADERMAL

## 2020-10-10 MED ORDER — ALPRAZOLAM 0.5 MG PO TABS
1.0000 mg | ORAL_TABLET | Freq: Every day | ORAL | Status: DC | PRN
  Administered 2020-10-10: 1 mg via ORAL

## 2020-10-10 MED ORDER — FENTANYL CITRATE (PF) 100 MCG/2ML IJ SOLN
INTRAMUSCULAR | Status: AC
  Filled 2020-10-10: qty 2

## 2020-10-10 MED ORDER — BUPROPION HCL ER (SR) 150 MG PO TB12
150.0000 mg | ORAL_TABLET | Freq: Two times a day (BID) | ORAL | Status: DC
  Administered 2020-10-10 – 2020-10-11 (×2): 150 mg via ORAL
  Filled 2020-10-10 (×2): qty 1

## 2020-10-10 MED ORDER — ASPIRIN EC 325 MG PO TBEC
325.0000 mg | DELAYED_RELEASE_TABLET | Freq: Every day | ORAL | Status: DC
  Administered 2020-10-11: 325 mg via ORAL
  Filled 2020-10-10: qty 1

## 2020-10-10 MED ORDER — PANTOPRAZOLE SODIUM 40 MG PO TBEC
80.0000 mg | DELAYED_RELEASE_TABLET | Freq: Every day | ORAL | Status: DC
  Administered 2020-10-10 – 2020-10-11 (×2): 80 mg via ORAL
  Filled 2020-10-10 (×2): qty 2

## 2020-10-10 MED ORDER — HEPARIN (PORCINE) IN NACL 1000-0.9 UT/500ML-% IV SOLN
INTRAVENOUS | Status: DC | PRN
  Administered 2020-10-10 (×2): 500 mL

## 2020-10-10 MED ORDER — MIDAZOLAM HCL 2 MG/2ML IJ SOLN
INTRAMUSCULAR | Status: DC | PRN
  Administered 2020-10-10 (×3): 1 mg via INTRAVENOUS

## 2020-10-10 MED ORDER — ESCITALOPRAM OXALATE 10 MG PO TABS
10.0000 mg | ORAL_TABLET | Freq: Every day | ORAL | Status: DC
  Administered 2020-10-10 – 2020-10-11 (×2): 10 mg via ORAL
  Filled 2020-10-10 (×2): qty 1

## 2020-10-10 MED ORDER — CARVEDILOL 3.125 MG PO TABS
3.1250 mg | ORAL_TABLET | Freq: Two times a day (BID) | ORAL | Status: DC
  Administered 2020-10-10 – 2020-10-11 (×2): 3.125 mg via ORAL
  Filled 2020-10-10 (×2): qty 1

## 2020-10-10 MED ORDER — CLOPIDOGREL BISULFATE 300 MG PO TABS
ORAL_TABLET | ORAL | Status: DC | PRN
  Administered 2020-10-10: 300 mg via ORAL

## 2020-10-10 MED ORDER — ATORVASTATIN CALCIUM 80 MG PO TABS
80.0000 mg | ORAL_TABLET | Freq: Every day | ORAL | Status: DC
  Administered 2020-10-10: 80 mg via ORAL
  Filled 2020-10-10: qty 1

## 2020-10-10 MED ORDER — IODIXANOL 320 MG/ML IV SOLN
INTRAVENOUS | Status: DC | PRN
  Administered 2020-10-10: 300 mL

## 2020-10-10 MED ORDER — INSULIN GLARGINE 100 UNIT/ML ~~LOC~~ SOLN
40.0000 [IU] | Freq: Two times a day (BID) | SUBCUTANEOUS | Status: DC
  Administered 2020-10-10 – 2020-10-11 (×2): 40 [IU] via SUBCUTANEOUS
  Filled 2020-10-10 (×3): qty 0.4

## 2020-10-10 MED ORDER — FENTANYL CITRATE (PF) 100 MCG/2ML IJ SOLN
INTRAMUSCULAR | Status: DC | PRN
  Administered 2020-10-10 (×3): 25 ug via INTRAVENOUS
  Administered 2020-10-10: 50 ug via INTRAVENOUS

## 2020-10-10 MED ORDER — ONDANSETRON HCL 4 MG/2ML IJ SOLN: 4.0000 mg | Freq: Four times a day (QID) | INTRAMUSCULAR | Status: DC | PRN

## 2020-10-10 MED ORDER — SODIUM CHLORIDE 0.9% FLUSH: 3.0000 mL | INTRAVENOUS | Status: DC | PRN

## 2020-10-10 MED ORDER — FLUCONAZOLE 100 MG PO TABS: 100.0000 mg | ORAL_TABLET | ORAL | Status: DC

## 2020-10-10 MED ORDER — INSULIN LISPRO (1 UNIT DIAL) 100 UNIT/ML (KWIKPEN): 15.0000 [IU] | PEN_INJECTOR | Freq: Three times a day (TID) | SUBCUTANEOUS | Status: DC

## 2020-10-10 MED ORDER — PREGABALIN 75 MG PO CAPS
150.0000 mg | ORAL_CAPSULE | Freq: Three times a day (TID) | ORAL | Status: DC
  Administered 2020-10-10 – 2020-10-11 (×2): 150 mg via ORAL
  Filled 2020-10-10 (×2): qty 2

## 2020-10-10 MED ORDER — SODIUM CHLORIDE 0.9% FLUSH
3.0000 mL | Freq: Two times a day (BID) | INTRAVENOUS | Status: DC
  Administered 2020-10-10: 3 mL via INTRAVENOUS

## 2020-10-10 MED ORDER — HYDROMORPHONE HCL 1 MG/ML IJ SOLN
INTRAMUSCULAR | Status: DC | PRN
Start: 2020-10-10 — End: 2020-10-10
  Administered 2020-10-10: 0.5 mg via INTRAVENOUS

## 2020-10-10 MED ORDER — MECLIZINE HCL 25 MG PO TABS
25.0000 mg | ORAL_TABLET | Freq: Every day | ORAL | Status: DC
  Administered 2020-10-10: 25 mg via ORAL
  Filled 2020-10-10: qty 1

## 2020-10-10 MED ORDER — HYDRALAZINE HCL 20 MG/ML IJ SOLN: 5.0000 mg | INTRAMUSCULAR | Status: DC | PRN

## 2020-10-10 MED ORDER — OXYCODONE HCL 5 MG PO TABS
15.0000 mg | ORAL_TABLET | Freq: Every day | ORAL | Status: DC
  Administered 2020-10-10 – 2020-10-11 (×2): 15 mg via ORAL
  Filled 2020-10-10 (×3): qty 3

## 2020-10-10 MED ORDER — HEPARIN SODIUM (PORCINE) 1000 UNIT/ML IJ SOLN
INTRAMUSCULAR | Status: DC | PRN
  Administered 2020-10-10 (×2): 10000 [IU] via INTRAVENOUS
  Administered 2020-10-10: 5000 [IU] via INTRAVENOUS
  Administered 2020-10-10: 16000 [IU] via INTRAVENOUS

## 2020-10-10 MED ORDER — LIRAGLUTIDE 18 MG/3ML ~~LOC~~ SOPN: 1.2000 mg | PEN_INJECTOR | Freq: Every day | SUBCUTANEOUS | Status: DC

## 2020-10-10 MED ORDER — HYDROMORPHONE HCL 1 MG/ML IJ SOLN
INTRAMUSCULAR | Status: AC
  Filled 2020-10-10: qty 0.5

## 2020-10-10 MED ORDER — DOXYCYCLINE HYCLATE 100 MG PO TABS
100.0000 mg | ORAL_TABLET | Freq: Two times a day (BID) | ORAL | Status: DC
  Administered 2020-10-10 – 2020-10-11 (×2): 100 mg via ORAL
  Filled 2020-10-10 (×2): qty 1

## 2020-10-10 MED ORDER — ALPRAZOLAM 0.5 MG PO TABS
1.0000 mg | ORAL_TABLET | Freq: Every day | ORAL | Status: DC
  Administered 2020-10-10: 1 mg via ORAL
  Filled 2020-10-10: qty 2

## 2020-10-10 MED ORDER — CLOPIDOGREL BISULFATE 75 MG PO TABS
75.0000 mg | ORAL_TABLET | Freq: Every day | ORAL | Status: DC
  Administered 2020-10-11: 75 mg via ORAL
  Filled 2020-10-10: qty 1

## 2020-10-10 MED ORDER — CLOPIDOGREL BISULFATE 300 MG PO TABS
ORAL_TABLET | ORAL | Status: AC
  Filled 2020-10-10: qty 1

## 2020-10-10 MED ORDER — SODIUM CHLORIDE 0.9 % IV SOLN: 250.0000 mL | INTRAVENOUS | Status: DC | PRN

## 2020-10-10 MED ORDER — HEPARIN (PORCINE) IN NACL 1000-0.9 UT/500ML-% IV SOLN
INTRAVENOUS | Status: AC
  Filled 2020-10-10: qty 1000

## 2020-10-10 MED ORDER — MORPHINE SULFATE (PF) 2 MG/ML IV SOLN
2.0000 mg | INTRAVENOUS | Status: DC | PRN
  Administered 2020-10-10: 2 mg via INTRAVENOUS
  Filled 2020-10-10: qty 1

## 2020-10-10 MED ORDER — LIDOCAINE HCL (PF) 1 % IJ SOLN
INTRAMUSCULAR | Status: AC
  Filled 2020-10-10: qty 30

## 2020-10-10 MED ORDER — ALBUTEROL SULFATE HFA 108 (90 BASE) MCG/ACT IN AERS
2.0000 | INHALATION_SPRAY | Freq: Four times a day (QID) | RESPIRATORY_TRACT | Status: DC | PRN
  Filled 2020-10-10: qty 6.7

## 2020-10-10 MED ORDER — CLOPIDOGREL BISULFATE 75 MG PO TABS: 300.0000 mg | ORAL_TABLET | Freq: Once | ORAL | Status: AC

## 2020-10-10 MED ORDER — OXYCODONE HCL 5 MG PO TABS: 5.0000 mg | ORAL_TABLET | ORAL | Status: DC | PRN

## 2020-10-10 SURGICAL SUPPLY — 34 items
BALLN IN.PACT DCB 6X60 (BALLOONS) ×3
BALLN MUSTANG 4X40X135 (BALLOONS) ×3
BALLN MUSTANG 5X80X135 (BALLOONS) ×3
BALLN MUSTANG 6X200X135 (BALLOONS) ×3
BALLOON MUSTANG 4X40X135 (BALLOONS) IMPLANT
BALLOON MUSTANG 5X80X135 (BALLOONS) IMPLANT
BALLOON MUSTANG 6X200X135 (BALLOONS) IMPLANT
CATH ANGIO 5F PIGTAIL 65CM (CATHETERS) ×1 IMPLANT
CATH CROSS OVER TEMPO 5F (CATHETERS) ×1 IMPLANT
CATH INFINITI 5FR JR4 125CM (CATHETERS) ×1 IMPLANT
CATH QUICKCROSS .035X135CM (MICROCATHETER) ×1 IMPLANT
CATH SOS OMNI O 5F 80CM (CATHETERS) ×1 IMPLANT
CATH STRAIGHT 5FR 65CM (CATHETERS) ×1 IMPLANT
CATH TEMPO AQUA 5F 100CM (CATHETERS) ×1 IMPLANT
DCB IN.PACT 6X60 (BALLOONS) IMPLANT
GLIDEWIRE ADV .035X260CM (WIRE) ×1 IMPLANT
GUIDEWIRE ANGLED .035X260CM (WIRE) ×1 IMPLANT
KIT ENCORE 26 ADVANTAGE (KITS) ×1 IMPLANT
KIT PV (KITS) ×3 IMPLANT
SHEATH DESTINATION 7FR 90 (SHEATH) ×1 IMPLANT
SHEATH PINNACLE 5F 10CM (SHEATH) ×1 IMPLANT
SHEATH PINNACLE 6F 10CM (SHEATH) ×1 IMPLANT
SHEATH PINNACLE 7F 10CM (SHEATH) ×1 IMPLANT
SHEATH PINNACLE ST 7F 45CM (SHEATH) ×1 IMPLANT
SHEATH PINNACLE ST 7F 65CM (SHEATH) ×1 IMPLANT
SHEATH PROBE COVER 6X72 (BAG) ×1 IMPLANT
STENT ELUVIA 6X120X130 (Permanent Stent) ×1 IMPLANT
STENT ELUVIA 6X80X130 (Permanent Stent) ×1 IMPLANT
STENT TIGRIS 6X80X120 (Permanent Stent) ×1 IMPLANT
SYR MEDRAD MARK V 150ML (SYRINGE) ×2 IMPLANT
TRANSDUCER W/STOPCOCK (MISCELLANEOUS) ×3 IMPLANT
TRAY PV CATH (CUSTOM PROCEDURE TRAY) ×3 IMPLANT
WIRE HITORQ VERSACORE ST 145CM (WIRE) ×1 IMPLANT
WIRE VERSACORE LOC 115CM (WIRE) ×1 IMPLANT

## 2020-10-10 NOTE — Interval H&P Note (Signed)
History and Physical Interval Note:  10/10/2020 2:55 PM  Joseph [name]  has presented today for surgery, with the diagnosis of non healing left 4th toe amp.  The various methods of treatment have been discussed with the patient and family. After consideration of risks, benefits and other options for treatment, the patient has consented to  Procedure(s): ABDOMINAL AORTOGRAM W/LOWER EXTREMITY (N/A) as a surgical intervention.  The patient's history has been reviewed, patient examined, no change in status, stable for surgery.  I have reviewed the patient's chart and labs.  Questions were answered to the patient's satisfaction.     Fabienne Bruns

## 2020-10-10 NOTE — Progress Notes (Signed)
Sheath pulled from right femoral site at 2105, bleeding for first 5 minutes. Artery found with doppler, unable to palpate, very deep. No bleeding after 2110. Pressure held until 2130, site level 0 with no bleeding, bruising, oozing, hematoma noted. Instructions given for post sheath pull bedrest and what to notify nurse of. Patient verbalized understanding. Report to Sander Radon RN

## 2020-10-10 NOTE — Op Note (Signed)
Procedure: Abdominal aortogram with bilateral lower extremity runoff, left popliteal stent (6 x 80), left SFA stent (6 x 120 and 6 x 80)  Preoperative diagnosis: Nonhealing wound left foot  Postoperative diagnosis: Same  Anesthesia: Local with IV sedation  Sedation time: 157 minutes  Operative findings: 1.  Multi segment greater than 80% stenosis left popliteal artery stented to 0% residual stenosis with 6 x 80 Tigris stent  2.  Multisegment greater than 80% stenosis of left superficial femoral artery stented to 0% residual stenosis 6 x 120 and 6 x 80 left Eluvia stent   3.  Heparin 40,000 units  4.  Two-vessel runoff left foot peroneal posterior tibial with distal reconstitution of the dorsalis pedis  5.  One-vessel posterior tibial runoff to the left foot graft operative details: After obtaining form consent, the patient was brought to the PV lab.  The patient was placed in supine position on the angio table.  Both groins were prepped and draped in usual sterile fashion.  Ultrasound was used to identify the right common femoral artery and femoral bifurcation.  An introducer needle was used to cannulate the right common femoral artery under ultrasound guidance after infiltrating local anesthesia over this.  Next an 035 versa core wire was advanced up into the abdominal aorta under fluoroscopic guidance.  A 5 French sheath placed over guidewire in the right common femoral artery.  This was thoroughly flushed with heparinized saline.  5 French pigtail catheter was advanced over the guidewire and abdominal aortogram was obtained in AP projection.  Left and right renal arteries are patent.  Infrarenal abdominal aorta is patent.  Left and right common iliac arteries are patent.  Next pigtail catheter was pulled down just above the aortic bifurcation and bilateral oblique views of the pelvis were performed.  Left and right common external and internal iliac arteries are all widely patent.  At this  point bilateral extremity runoff views were performed through the pigtail catheter.  There was differential flow mainly going down the right leg.  We did not get very good images on the left leg.  However the left common femoral profunda femoris and proximal superficial femoral artery was patent.  In the right leg, the right common femoral profunda femoris and superficial femoral arteries are patent.  There is multisegment 50 to 70% stenosis throughout the right superficial femoral popliteal artery.  There is primarily one-vessel runoff via the posterior tibial artery to the right foot.  At this point the pigtail catheter was removed over guidewire and swapped out for an 035 crossover catheter.  This was used to selectively catheterize the left common iliac followed by the external iliac artery.  A 5 French straight catheter was advanced over the guidewire and a left lower extremity arteriogram was obtained.  The left common femoral profunda femoris and superficial femoral arteries are patent.  However there are multi segments of 50 to 90% stenosis within the left superficial femoral artery.  In the popliteal artery there are 2 segments of 90% stenosis 1 just above and one right at the knee joint.  There is two-vessel runoff to the left foot by the posterior tibial and peroneal arteries.  The anterior tibial artery is occluded.   A guidewire was placed back through the 5 French straight catheter.  The catheter was removed and I attempted to swap out the 5 [place] sheath for a 7 [place] 60 sheath.  However this fractured at the skin level and could not be used.  We did not have another 7 was 60 sheath.  We initially were going to use a 7 x 90 sheath and were able to advance this into the right iliac system but we were not going to have catheters long enough to reach our destination through this.  Therefore the 90 sheath was removed and swapped out for a 7 x 45 cm sheath.  The patient was given a total of 41,000  units of intravenous heparin in order to maintain a ACT greater than 250 during the case.  An 035 angled Glidewire was then used to cross the lesions in the SFA and popliteal.  Contrast angiogram was used to perform roadmapping.  Initially we did an angioplasty with a 4 x 40 angioplasty balloon.  I then used a 6 x 60 drug-eluting impact balloon inflated to nominal pressure.  There was an area of dissection still present.  Therefore I stented this with a 6 x 80 Tigris stent.  This was postdilated with a 5 x 80 balloon.  We then proceeded to treat the superficial femoral artery from about 7 cm below the origin all the way down to the adductor hiatus.  This was treated with a 6 x 120 Alusio drug stent overlapping with a 6 x 80 eluvia drug stent.  These were postdilated initially with a 5 x 80 balloon and then with a 6 x 200 balloon to nominal pressure.  Completion angiogram showed all the stents were widely patent.  There was still intact two-vessel runoff to the left foot.  At this point the sheath was pulled back over a guidewire and swapped out for a 7 [place] short sheath.  Patient tolerated procedure well and there were no complications.  Patient was taken to the holding area in stable condition.  Fabienne Bruns, MD [facility] Office: [phone]

## 2020-10-11 ENCOUNTER — Encounter (HOSPITAL_COMMUNITY): Payer: Self-pay | Admitting: Vascular Surgery

## 2020-10-11 DIAGNOSIS — E1151 Type 2 diabetes mellitus with diabetic peripheral angiopathy without gangrene: Secondary | ICD-10-CM | POA: Diagnosis not present

## 2020-10-11 HISTORY — PX: PERIPHERAL VASCULAR INTERVENTION: CATH118257

## 2020-10-11 LAB — BASIC METABOLIC PANEL
Anion gap: 7 (ref 5–15)
BUN: 22 mg/dL — ABNORMAL HIGH (ref 6–20)
CO2: 26 mmol/L (ref 22–32)
Calcium: 8.8 mg/dL — ABNORMAL LOW (ref 8.9–10.3)
Chloride: 109 mmol/L (ref 98–111)
Creatinine, Ser: 1.35 mg/dL — ABNORMAL HIGH (ref 0.61–1.24)
GFR, Estimated: >60 mL/min (ref >60)
Glucose, Bld: 128 mg/dL — ABNORMAL HIGH (ref 70–99)
Potassium: 4.7 mmol/L (ref 3.5–5.1)
Sodium: 142 mmol/L (ref 135–145)

## 2020-10-11 LAB — GLUCOSE, CAPILLARY: Glucose-Capillary: 105 mg/dL — ABNORMAL HIGH (ref 70–99)

## 2020-10-11 LAB — CBC
HCT: 32.9 % — ABNORMAL LOW (ref 39.0–52.0)
Hemoglobin: 9.5 g/dL — ABNORMAL LOW (ref 13.0–17.0)
MCH: 23.9 pg — ABNORMAL LOW (ref 26.0–34.0)
MCHC: 28.9 g/dL — ABNORMAL LOW (ref 30.0–36.0)
MCV: 82.7 fL (ref 80.0–100.0)
Platelets: 280 10*3/uL (ref 150–400)
RBC: 3.98 MIL/uL — ABNORMAL LOW (ref 4.22–5.81)
RDW: 15.9 % — ABNORMAL HIGH (ref 11.5–15.5)
WBC: 8.2 10*3/uL (ref 4.0–10.5)
nRBC: 0 % (ref 0.0–0.2)

## 2020-10-11 MED ORDER — CLOPIDOGREL BISULFATE 75 MG PO TABS: 75.0000 mg | ORAL_TABLET | Freq: Every day | ORAL | 3 refills | Status: DC

## 2020-10-11 NOTE — Discharge Instructions (Signed)
° °  [facility]  ° °Discharge Instructions ° °Lower Extremity Angiogram; Angioplasty/Stenting ° °Please refer to the following instructions for your post-procedure care. Your surgeon or physician assistant will discuss any changes with you. ° °Activity ° °Avoid lifting more than 8 pounds (1 gallons of milk) for 72 hours (3 days) after your procedure. You may walk as much as you can tolerate. It's OK to drive after 72 hours. ° °Bathing/Showering ° °You may shower the day after your procedure. If you have a bandage, you may remove it at 24- 48 hours. Clean your incision site with mild soap and water. Pat the area dry with a clean towel. ° °Diet ° °Resume your pre-procedure diet. There are no special food restrictions following this procedure. All patients with peripheral vascular disease should follow a low fat/low cholesterol diet. In order to heal from your surgery, it is CRITICAL to get adequate nutrition. Your body requires vitamins, minerals, and protein. Vegetables are the best source of vitamins and minerals. Vegetables also provide the perfect balance of protein. Processed food has little nutritional value, so try to avoid this. ° °Medications ° °Resume taking all of your medications unless your doctor tells you not to. If your incision is causing pain, you may take over-the-counter pain relievers such as acetaminophen (Tylenol) ° °Follow Up ° °Follow up will be arranged at the time of your procedure. You may have an office visit scheduled or may be scheduled for surgery. Ask your surgeon if you have any questions. ° °Please call us immediately for any of the following conditions: °•Severe or worsening pain your legs or feet at rest or with walking. °•Increased pain, redness, drainage at your groin puncture site. °•Fever of 101 degrees or higher. °•If you have any mild or slow bleeding from your puncture site: lie down, apply firm constant pressure over the area with a piece of  gauze or a clean wash cloth for 30 minutes- no peeking!, call 911 right away if you are still bleeding after 30 minutes, or if the bleeding is heavy and unmanageable. ° °Reduce your risk factors of vascular disease: ° °Stop smoking. If you would like help call [facility] at 1-800-QUIT-NOW (1-800-784-8669) or [place] at [phone]. °Manage your cholesterol °Maintain a desired weight °Control your diabetes °Keep your blood pressure down ° °If you have any questions, please call the office at [phone] ° °

## 2020-10-11 NOTE — Plan of Care (Signed)
1125 patient discharged with education completed and no complication from right femoral site

## 2020-10-11 NOTE — Progress Notes (Addendum)
[facility]  Subjective  - Doing well over all.   Objective (!) 163/99 90 97.9 F (36.6 C) (Oral) 19 98%  Intake/Output Summary (Last 24 hours) at 10/11/2020 0913 Last data filed at 10/11/2020 0203 Gross per 24 hour  Intake 400 ml  Output 1400 ml  Net -1000 ml   Groin soft Left foot warm to touch no change in wound appearance. Lungs non labored breathing   Assessment/Planning: POD # 1 Abdominal aortogram with bilateral lower extremity runoff, left popliteal stent (6 x 80), left SFA stent (6 x 120 and 6 x 80)  Plan for discharge today in stable condition.  He will f/u with DR. [name] concerning further left LE surgery.  Discharge plan on Plavix, Statin, and asa. F/U with Dr. Arbie Cookey in [place] with ABI and left LE arterial duplex. F/U with Dr. [name] as planned.   Joseph [name] 10/11/2020 9:13 AM --  Laboratory Lab Results: Recent Labs    10/10/20 1111 10/11/20 0245  WBC  --  8.2  HGB 11.9* 9.5*  HCT 35.0* 32.9*  PLT  --  280   BMET Recent Labs    10/10/20 1111 10/11/20 0245  NA 145 142  K 4.7 4.7  CL 108 109  CO2  --  26  GLUCOSE 115* 128*  BUN 29* 22*  CREATININE 1.20 1.35*  CALCIUM  --  8.8*    COAG Lab Results  Component Value Date   INR 1.5 (H) 07/24/2020   INR 1.3 (H) 07/19/2020   INR 5.85 (HH) 07/29/2016   No results found for: PTT   I agree with the above.  I have seen and evaluated the patient.  His right groin cannulation site is without complication.  Labs are okay this morning.  He will be discharged home.  I spoke with Dr. [name] for ongoing follow-up of his left foot ulcer.  Durene Cal

## 2020-10-13 ENCOUNTER — Encounter (HOSPITAL_COMMUNITY): Payer: Self-pay | Admitting: Vascular Surgery

## 2020-10-13 ENCOUNTER — Other Ambulatory Visit: Payer: Self-pay

## 2020-10-13 DIAGNOSIS — I739 Peripheral vascular disease, unspecified: Secondary | ICD-10-CM

## 2020-10-13 LAB — POCT ACTIVATED CLOTTING TIME
Activated Clotting Time: 186 seconds
Activated Clotting Time: 224 seconds
Activated Clotting Time: 268 seconds
Activated Clotting Time: 246 seconds
Activated Clotting Time: 257 seconds

## 2020-10-13 NOTE — Discharge Summary (Signed)
[facility] Discharge Summary   Patient ID:  Joseph [name] MRN: [id number] DOB/AGE: [birth year] 49 y.o.  Admit date: 10/10/2020 Discharge date: 10/ 23/2021 Date of Surgery: 10/10/2020 Surgeon: Surgeon(s): Fields, Jessy Oto, MD  Admission Diagnosis: PAD (peripheral artery disease) [facility]) [I73.9]  Discharge Diagnoses:  PAD (peripheral artery disease) ([place]) [I73.9]  Secondary Diagnoses: Past Medical History:  Diagnosis Date  . Anxiety   . Arthritis    "knees, shoulders, hips, ankles" (07/29/2016)  . Asthma   . CAD (coronary artery disease)    a. 2017: s/p BMS to distal Cx; b. LHC 05/30/2019: 80% mid, distal RCA s/p DES, 30% narrowing of d LM, widely patent LAD w/ luminal irregularities, widely patent stent in [place]  w/ 90+% stenosis distal to stent beofre small trifurcating obtuse marginal (potentially area of restenosis)  . Chronic bronchitis ([place])   . Chronic ulcer of right great toe ([place]) 09/19/2017  . COPD (chronic obstructive pulmonary disease) ([place])   . Depression   . DVT (deep venous thrombosis) ([place])   . GERD (gastroesophageal reflux disease)   . Hyperlipidemia   . Hypertension   . Myocardial infarction ([place]) 1996   "light one"  . PE (pulmonary embolism) 04/2013   On chronic Xarelto  . Peripheral nerve disease   . Pneumonia "several times"  . Sleep apnea   . Type II diabetes mellitus ([facility])     Procedure(s): ABDOMINAL AORTOGRAM W/LOWER EXTREMITY PERIPHERAL VASCULAR INTERVENTION  Discharged Condition: stable  HPI: 49 y/o male here for Evaluation lower extremity arterial flow with nonhealing left fourth toe amputation.  The wound is followed by Dr. Sharol Given.  Plan is for angiogram to improve arterial flow to heal the wound on his left foot.    Hospital Course:  Joseph [name] is a 49 y.o. male is S/P  Procedure(s): ABDOMINAL AORTOGRAM W/LOWER EXTREMITY PERIPHERAL VASCULAR INTERVENTION  Post op right groin soft without hematoma.  Doppler DP  signal.  Stable for discharge on Plavix, Statin, and asa.  He will f/u with Dr. Sharol Given.  F/U with Dr. Donnetta Hutching in [place] with ABI and left LE arterial duplex.       Significant Diagnostic Studies: CBC Lab Results  Component Value Date   WBC 8.2 10/11/2020   HGB 9.5 (L) 10/11/2020   HCT 32.9 (L) 10/11/2020   MCV 82.7 10/11/2020   PLT 280 10/11/2020    BMET    Component Value Date/Time   NA 142 10/11/2020 0245   NA 142 05/25/2019 1614   K 4.7 10/11/2020 0245   CL 109 10/11/2020 0245   CO2 26 10/11/2020 0245   GLUCOSE 128 (H) 10/11/2020 0245   BUN 22 (H) 10/11/2020 0245   BUN 18 05/25/2019 1614   CREATININE 1.35 (H) 10/11/2020 0245   CREATININE 1.21 09/29/2016 1134   CALCIUM 8.8 (L) 10/11/2020 0245   GFRNONAA >60 10/11/2020 0245   GFRAA 51 (L) 08/13/2020 1055   COAG Lab Results  Component Value Date   INR 1.5 (H) 07/24/2020   INR 1.3 (H) 07/19/2020   INR 5.85 (HH) 07/29/2016     Disposition:  Discharge to :Home Discharge Instructions    Call MD for:  redness, tenderness, or signs of infection (pain, swelling, bleeding, redness, odor or green/yellow discharge around incision site)   Complete by: As directed    Call MD for:  severe or increased pain, loss or decreased feeling  in affected limb(s)   Complete by: As directed    Call MD for:  temperature >100.5   Complete by: As directed    Resume previous diet   Complete by: As directed      Allergies as of 10/11/2020      Reactions   Sulfa Antibiotics Other (See Comments)   headache      Medication List    TAKE these medications   ALPRAZolam 1 MG tablet Commonly known as: XANAX Take 1 mg by mouth See admin instructions. Take 1 mg as needed daily anxiety  and 1 mg at bedtime for sleep Notes to patient: Anxiety    aspirin 325 MG EC tablet Take 1 tablet (325 mg total) by mouth daily. Notes to patient: Prevents clotting in the stent    atorvastatin 80 MG tablet Commonly known as: LIPITOR Take 1 tablet  (80 mg total) by mouth daily at 6 PM. Notes to patient: Cholesterol    blood glucose meter kit and supplies Kit Dispense based on patient and insurance preference. Use up to four times daily as directed. (FOR ICD-9 250.00, 250.01).   buPROPion 150 MG 12 hr tablet Commonly known as: WELLBUTRIN SR Take 150 mg by mouth 2 (two) times daily. Notes to patient: Depression   carvedilol 3.125 MG tablet Commonly known as: COREG TAKE 1 TABLET(3.125 MG) BY MOUTH TWICE DAILY What changed: See the new instructions. Notes to patient: Decreases work of the heart Lowers blood pressure and heart rate   clopidogrel 75 MG tablet Commonly known as: PLAVIX Take 1 tablet (75 mg total) by mouth daily with breakfast. Notes to patient: Prevents clotting in the stent    doxycycline 100 MG tablet Commonly known as: VIBRA-TABS Take 1 tablet (100 mg total) by mouth 2 (two) times daily. What changed: additional instructions   escitalopram 10 MG tablet Commonly known as: LEXAPRO Take 10 mg by mouth daily. Notes to patient: Depression anxiety   fenofibrate 145 MG tablet Commonly known as: TRICOR Take 145 mg by mouth daily. Notes to patient: Cholesterol    fluconazole 100 MG tablet Commonly known as: DIFLUCAN Take 100 mg by mouth every Monday. Notes to patient: Monday as instructed   fluticasone 50 MCG/ACT nasal spray Commonly known as: FLONASE Place 2 sprays into both nostrils daily as needed for allergies. Notes to patient: Nasal congestion   HumaLOG KwikPen 100 UNIT/ML KwikPen Generic drug: insulin lispro Inject 15-40 Units into the skin 3 (three) times daily with meals. sliding scale Notes to patient: Blood sugar   Lantus SoloStar 100 UNIT/ML Solostar Pen Generic drug: insulin glargine Inject 40 Units into the skin 2 (two) times daily. Notes to patient: Blood sugar    meclizine 25 MG tablet Commonly known as: ANTIVERT Take 25-50 mg by mouth See admin instructions. Take 50 mg by mouth in  the morning & take  25 mg by mouth at night. Notes to patient: Dizziness    metFORMIN 1000 MG tablet Commonly known as: GLUCOPHAGE Take 1 tablet (1,000 mg total) by mouth 2 (two) times daily with a meal. Notes to patient: DO NOT TAKE TODAY OR TOMORROW TO PROTECT YOUR KIDNEYS   oxyCODONE 15 MG immediate release tablet Commonly known as: ROXICODONE Take 15 mg by mouth 5 (five) times daily. Notes to patient: Pain    pantoprazole 40 MG tablet Commonly known as: PROTONIX Take 1 tablet (40 mg total) by mouth daily. What changed: how much to take Notes to patient: Acid reflux heartburn   pregabalin 150 MG capsule Commonly known as: LYRICA Take 150 mg by mouth 3 (three)  times daily. Notes to patient: Nerve pain    ProAir HFA 108 (90 Base) MCG/ACT inhaler Generic drug: albuterol Inhale 2 puffs into the lungs every 6 (six) hours as needed for wheezing or shortness of breath.   Victoza 18 MG/3ML Sopn Generic drug: liraglutide Inject 1.2 mg into the skin at bedtime. Notes to patient: Blood sugar       Verbal and written Discharge instructions given to the patient. Wound care per Discharge AVS  Follow-up Information    Early, Arvilla Meres, MD Follow up in 3 week(s).   Specialties: Vascular Surgery, Cardiology Contact information: [address] [place] [address] [phone]               Signed: Roxy Horseman 10/13/2020, 10:36 AM

## 2020-10-16 ENCOUNTER — Encounter: Payer: Self-pay | Admitting: Physician Assistant

## 2020-10-16 ENCOUNTER — Ambulatory Visit (INDEPENDENT_AMBULATORY_CARE_PROVIDER_SITE_OTHER): Payer: Medicare Other | Admitting: Physician Assistant

## 2020-10-16 DIAGNOSIS — M869 Osteomyelitis, unspecified: Secondary | ICD-10-CM

## 2020-10-16 NOTE — Progress Notes (Signed)
Office Visit Note   Patient: Joseph [name]           Date of Birth: Apr 20, 1971           MRN: [id number] Visit Date: 10/16/2020              Requested by: Marva Panda, NP [address] [place],  [name] [address] PCP: Marva Panda, NP  Chief Complaint  Patient presents with  . Left Foot - Routine Post Op      HPI: Patient presents today 1 month status post left fourth ray amputation.  He is status post revascularization procedure to his left lower extremity.  A stent was placed.  His wife thinks that the wound has been looking better since then.  Assessment & Plan: Visit Diagnoses: No diagnosis found.  Plan: Continue dry dressing changes and cleansing daily follow-up in 2 weeks or sooner if any ischemic changes  Follow-Up Instructions: No follow-ups on file.   Ortho Exam  Patient is alert, oriented, no adenopathy, well-dressed, normal affect, normal respiratory effort. Focused examination demonstrates dorsal portion of wound is completely healed he still has some maceration in the web space but not as erythematous there are no draining.  Plantar surface also looks improved there is no surrounding cellulitis or foul odor.  Swelling is overall well contained.  No ischemic changes in the fifth toe  Imaging: No results found. No images are attached to the encounter.  Labs: Lab Results  Component Value Date   HGBA1C 9.4 (H) 07/18/2020   HGBA1C 8.2 (H) 11/02/2019   HGBA1C 8.2 (H) 11/01/2019   ESRSEDRATE 41 (H) 09/16/2017   CRP 8.9 (H) 09/16/2017   REPTSTATUS 07/23/2020 FINAL 07/18/2020   REPTSTATUS 07/23/2020 FINAL 07/18/2020   CULT  07/18/2020    NO GROWTH 5 DAYS Performed at [facility], [address] [place], [name] [address]    CULT  07/18/2020    NO GROWTH 5 DAYS Performed at [facility], [address], [place], [name] [address]      Lab Results  Component Value Date   ALBUMIN 2.9 (L) 07/27/2020   ALBUMIN 2.9 (L) 07/26/2020    ALBUMIN 3.0 (L) 07/21/2020    Lab Results  Component Value Date   MG 2.0 07/25/2020   MG 2.1 07/25/2020   MG 2.3 07/24/2020   No results found for: VD25OH  No results found for: PREALBUMIN CBC EXTENDED Latest Ref Rng & Units 10/11/2020 10/10/2020 08/13/2020  WBC 4.0 - 10.5 K/uL 8.2 - -  RBC 4.22 - 5.81 MIL/uL 3.98(L) - -  HGB 13.0 - 17.0 g/dL 4.6(N) 11.9(L) 8.8(L)  HCT 39 - 52 % 32.9(L) 35.0(L) 26.0(L)  PLT 150 - 400 K/uL 280 - -  NEUTROABS 1.7 - 7.7 K/uL - - -  LYMPHSABS 0.7 - 4.0 K/uL - - -     There is no height or weight on file to calculate BMI.  Orders:  No orders of the defined types were placed in this encounter.  No orders of the defined types were placed in this encounter.    Procedures: No procedures performed  Clinical Data: No additional findings.  ROS:  All other systems negative, except as noted in the HPI. Review of Systems  Objective: Vital Signs: There were no vitals taken for this visit.  Specialty Comments:  No specialty comments available.  PMFS History: Patient Active Problem List   Diagnosis Date Noted  . PAD (peripheral artery disease) (HCC) 10/10/2020  . Osteomyelitis  of fourth toe of right foot (HCC)   . S/P CABG x 4 07/24/2020  . Non-ST elevation (NSTEMI) myocardial infarction (HCC) 07/22/2020  . Acute on chronic diastolic heart failure (HCC) 07/22/2020  . Hypotension 07/18/2020  . Left leg swelling 07/18/2020  . Elevated troponin 07/18/2020  . AKI (acute kidney injury) (HCC) 07/18/2020  . Elevated brain natriuretic peptide (BNP) level 07/18/2020  . GERD (gastroesophageal reflux disease) 07/18/2020  . Severe Sepsis secondary to Lt leg/Foot Cellulitis 07/18/2020  . Severe sepsis --Severe Sepsis secondary to Lt leg/Foot Cellulitis with hypotension and AKI 07/18/2020  . Unstable angina (HCC) 11/01/2019  . Edema 06/13/2019  . Leukocytosis 05/31/2019  . Smoker 03/08/2019  . Chronic pain 03/08/2019  . Chronic back pain  02/16/2019  . Deep venous thrombosis (HCC) 02/16/2019  . Obesity, Class III, BMI 40-49.9 (morbid obesity) (HCC) 02/16/2019  . Peripheral nerve disease 02/16/2019  . COPD (chronic obstructive pulmonary disease) (HCC) 01/22/2019  . Anxiety disorder 03/26/2018  . Open wound of toe 10/12/2017  . Sinusitis 10/12/2017  . Abdominal pain   . Loss of weight   . Diabetic foot infection (HCC) 09/16/2017  . Uncontrolled type 2 diabetes mellitus with hyperglycemia, with long-term current use of insulin (HCC) 09/16/2017  . Cellulitis of right foot   . Chronic ulcer of great toe of right foot (HCC) 09/09/2017  . Recurrent chest pain 07/29/2016  . Hyperglycemia   . Insulin dependent type 2 diabetes mellitus (HCC) 04/29/2015  . OSA (obstructive sleep apnea) 05/08/2013  . History of pulmonary embolus (PE) 04/27/2013  . CAD -S/P PCI 11/02/19  09/10/2008  . Dyslipidemia 09/09/2008   Past Medical History:  Diagnosis Date  . Anxiety   . Arthritis    "knees, shoulders, hips, ankles" (07/29/2016)  . Asthma   . CAD (coronary artery disease)    a. 2017: s/p BMS to distal Cx; b. LHC 05/30/2019: 80% mid, distal RCA s/p DES, 30% narrowing of d LM, widely patent LAD w/ luminal irregularities, widely patent stent in dCX  w/ 90+% stenosis distal to stent beofre small trifurcating obtuse marginal (potentially area of restenosis)  . Chronic bronchitis (HCC)   . Chronic ulcer of right great toe (HCC) 09/19/2017  . COPD (chronic obstructive pulmonary disease) (HCC)   . Depression   . DVT (deep venous thrombosis) (HCC)   . GERD (gastroesophageal reflux disease)   . Hyperlipidemia   . Hypertension   . Myocardial infarction (HCC) 1996   "light one"  . PE (pulmonary embolism) 04/2013   On chronic Xarelto  . Peripheral nerve disease   . Pneumonia "several times"  . Sleep apnea   . Type II diabetes mellitus (HCC)     Family History  Problem Relation Age of Onset  . Hypertension Brother   . Hypertension Father    . Diabetes Other   . Hyperlipidemia Other     Past Surgical History:  Procedure Laterality Date  . ABDOMINAL AORTOGRAM W/LOWER EXTREMITY N/A 10/10/2020   Procedure: ABDOMINAL AORTOGRAM W/LOWER EXTREMITY;  Surgeon: Sherren Kerns, MD;  [facility];  Service: Cardiovascular;  Laterality: N/A;  . AMPUTATION Right 09/21/2017   Procedure: RIGHT GREAT TOE AMPUTATION, POSSIBLE VAC;  Surgeon: Tarry Kos, MD;  Location: [facility] OR;  Service: Orthopedics;  Laterality: Right;  . AMPUTATION Left 08/13/2020   Procedure: LEFT FOOT 4TH RAY AMPUTATION;  Surgeon: [name], MD;  Location: [facility] OR;  Service: Orthopedics;  Laterality: Left;  . CARDIAC CATHETERIZATION  2006  .  CARDIAC CATHETERIZATION  1996   "@ [facility]; when I had my heart attack"  . CARDIAC CATHETERIZATION N/A 07/29/2016   Procedure: Left Heart Cath and Coronary Angiography;  Surgeon: Runell Gess, MD;  Location: [facility] INVASIVE CV LAB;  Service: Cardiovascular;  Laterality: N/A;  . CARDIAC CATHETERIZATION N/A 07/29/2016   Procedure: Coronary Stent Intervention;  Surgeon: Runell Gess, MD;  [facility];  Service: Cardiovascular;  Laterality: N/A;  . CARPAL TUNNEL RELEASE Bilateral   . CORONARY ANGIOPLASTY WITH STENT PLACEMENT  07/29/2016  . CORONARY ARTERY BYPASS GRAFT N/A 07/24/2020   Procedure: CORONARY ARTERY BYPASS GRAFTING (CABG), ON PUMP, TIMES FOUR, USING LEFT INTERNAL MAMMARY ARTERY AND ENDOSCOPICALLY HARVESTED RIGHT GREATER SAPHENOUS VEIN;  Surgeon: Corliss Skains, MD;  Location: [facility] OR;  Service: Open Heart Surgery;  Laterality: N/A;  FLOW TAC  . CORONARY STENT INTERVENTION N/A 05/30/2019   Procedure: CORONARY STENT INTERVENTION;  Surgeon: Lyn Records, MD;  Location: [facility] INVASIVE CV LAB;  Service: Cardiovascular;  Laterality: N/A;  . CORONARY STENT INTERVENTION N/A 11/02/2019   Procedure: CORONARY STENT INTERVENTION;  Surgeon: Iran Ouch, MD;  [facility];  Service:  Cardiovascular;  Laterality: N/A;  . ESOPHAGOGASTRODUODENOSCOPY N/A 09/22/2017   Procedure: ESOPHAGOGASTRODUODENOSCOPY (EGD);  Surgeon: Meryl Dare, MD;  Location: [facility] ENDOSCOPY;  Service: Endoscopy;  Laterality: N/A;  . INTRAVASCULAR PRESSURE WIRE/FFR STUDY N/A 07/22/2020   Procedure: INTRAVASCULAR PRESSURE WIRE/FFR STUDY;  Surgeon: Marykay Lex, MD;  Location: [facility] INVASIVE CV LAB;  Service: Cardiovascular;  Laterality: N/A;  . KNEE ARTHROSCOPY Bilateral    "2 on left; 1 on the right"  . LEFT HEART CATH AND CORONARY ANGIOGRAPHY N/A 11/02/2019   Procedure: LEFT HEART CATH AND CORONARY ANGIOGRAPHY;  Surgeon: Iran Ouch, MD;  [facility];  Service: Cardiovascular;  Laterality: N/A;  . LEFT HEART CATH AND CORONARY ANGIOGRAPHY N/A 07/22/2020   Procedure: LEFT HEART CATH AND CORONARY ANGIOGRAPHY;  Surgeon: Marykay Lex, MD;  Location: [facility] INVASIVE CV LAB;  Service: Cardiovascular;  Laterality: N/A;  . PERIPHERAL VASCULAR INTERVENTION Left 10/11/2020   popliteal and SFA stent placement   . PERIPHERAL VASCULAR INTERVENTION Left 10/10/2020   Procedure: PERIPHERAL VASCULAR INTERVENTION;  Surgeon: Sherren Kerns, MD;  Location: [facility] INVASIVE CV LAB;  Service: Cardiovascular;  Laterality: Left;  . RIGHT/LEFT HEART CATH AND CORONARY ANGIOGRAPHY N/A 05/30/2019   Procedure: RIGHT/LEFT HEART CATH AND CORONARY ANGIOGRAPHY;  Surgeon: Lyn Records, MD;  [facility];  Service: Cardiovascular;  Laterality: N/A;  . SHOULDER OPEN ROTATOR CUFF REPAIR Bilateral   . TEE WITHOUT CARDIOVERSION N/A 07/24/2020   Procedure: TRANSESOPHAGEAL ECHOCARDIOGRAM (TEE);  Surgeon: Corliss Skains, MD;  Location: [facility] OR;  Service: Open Heart Surgery;  Laterality: N/A;   Social History   Occupational History  . Occupation: disabled     Associate Professor: UNEMPLOYED  Tobacco Use  . Smoking status: Former Smoker    Packs/day: 1.00    Years: 34.00    Pack years: 34.00    Types: Cigarettes      Quit date: 07/12/2020    Years since quitting: 0.2  . Smokeless tobacco: Former Neurosurgeon    Types: Snuff, Chew  Vaping Use  . Vaping Use: Never used  Substance and Sexual Activity  . Alcohol use: Yes    Comment: RARE  . Drug use: No  . Sexual activity: Yes

## 2020-10-17 ENCOUNTER — Ambulatory Visit (HOSPITAL_COMMUNITY): Payer: Medicare Other

## 2020-10-24 ENCOUNTER — Ambulatory Visit (HOSPITAL_COMMUNITY): Admission: RE | Admit: 2020-10-24 | Payer: Medicare Other | Source: Ambulatory Visit

## 2020-10-30 ENCOUNTER — Ambulatory Visit: Payer: Medicare Other | Admitting: Physician Assistant

## 2020-10-31 ENCOUNTER — Encounter: Payer: Self-pay | Admitting: Physician Assistant

## 2020-10-31 ENCOUNTER — Ambulatory Visit (INDEPENDENT_AMBULATORY_CARE_PROVIDER_SITE_OTHER): Payer: Medicare Other | Admitting: Physician Assistant

## 2020-10-31 VITALS — Ht 77.0 in | Wt 353.0 lb

## 2020-10-31 DIAGNOSIS — M869 Osteomyelitis, unspecified: Secondary | ICD-10-CM

## 2020-10-31 NOTE — Progress Notes (Signed)
Office Visit Note   Patient: Joseph [name]           Date of Birth: 26-Nov-1971           MRN: [id number] Visit Date: 10/31/2020              Requested by: Marva Panda, NP [address] [place],  [name] [address] PCP: Marva Panda, NP  Chief Complaint  Patient presents with  . Left Foot - Follow-up    08/13/20 4th ray amputation       HPI: The patient is a 49 year old gentleman seen status post fourth ray amputation of the left foot this has been slow to heal he complains of some pink drainage he states he has been attempting to nonweightbearing in his fracture boot is much as possible.  He is in a wheelchair today.  Complains of some swelling to the lower leg and foot  Did have 2 stents placed after vascular procedure 3 weeks ago.  States he has had new bleeding from the wound since the procedure.  Will be completing a course of oral antibiotics soon.  Had been having maceration to the plantar aspect of his foot as well as in the webspace.  This is improved today Assessment & Plan: Visit Diagnoses: No diagnosis found.  Plan: Provided sheet of silver cell they will begin silver cell packing of the wound in the webspace.  Continue to minimize weightbearing follow-up in the office in 2 more weeks  Follow-Up Instructions: No follow-ups on file.   Ortho Exam  Patient is alert, oriented, no adenopathy, well-dressed, normal affect, normal respiratory effort. On examination of the left foot the majority of the incision is healed unfortunately in the webspace he did have some dehiscence this is about 5 mm deep does not probe to bone or tendon there is 50% fibrinous tissue in this wound bed he does have some ulceration to the plantar aspect of his foot just proximal to the webspace this is filled in with 100% granulation.  Imaging: No results found. No images are attached to the encounter.  Labs: Lab Results  Component Value Date   HGBA1C 9.4 (H) 07/18/2020    HGBA1C 8.2 (H) 11/02/2019   HGBA1C 8.2 (H) 11/01/2019   ESRSEDRATE 41 (H) 09/16/2017   CRP 8.9 (H) 09/16/2017   REPTSTATUS 07/23/2020 FINAL 07/18/2020   REPTSTATUS 07/23/2020 FINAL 07/18/2020   CULT  07/18/2020    NO GROWTH 5 DAYS Performed at [facility], [address] [place], [name] [address]    CULT  07/18/2020    NO GROWTH 5 DAYS Performed at [facility], [address] [place], [name] [address]      Lab Results  Component Value Date   ALBUMIN 2.9 (L) 07/27/2020   ALBUMIN 2.9 (L) 07/26/2020   ALBUMIN 3.0 (L) 07/21/2020    Lab Results  Component Value Date   MG 2.0 07/25/2020   MG 2.1 07/25/2020   MG 2.3 07/24/2020   No results found for: VD25OH  No results found for: PREALBUMIN CBC EXTENDED Latest Ref Rng & Units 10/11/2020 10/10/2020 08/13/2020  WBC 4.0 - 10.5 K/uL 8.2 - -  RBC 4.22 - 5.81 MIL/uL 3.98(L) - -  HGB 13.0 - 17.0 g/dL 1.4(H) 11.9(L) 8.8(L)  HCT 39 - 52 % 32.9(L) 35.0(L) 26.0(L)  PLT 150 - 400 K/uL 280 - -  NEUTROABS 1.7 - 7.7 K/uL - - -  LYMPHSABS 0.7 - 4.0 K/uL - - -  Body mass index is 41.86 kg/m.  Orders:  No orders of the defined types were placed in this encounter.  No orders of the defined types were placed in this encounter.    Procedures: No procedures performed  Clinical Data: No additional findings.  ROS:  All other systems negative, except as noted in the HPI. Review of Systems  Constitutional: Negative for chills and fever.  Skin: Positive for wound. Negative for color change.    Objective: Vital Signs: Ht 6\' 5"  (1.956 m)   Wt (!) 353 lb (160.1 kg)   BMI 41.86 kg/m   Specialty Comments:  No specialty comments available.  PMFS History: Patient Active Problem List   Diagnosis Date Noted  . PAD (peripheral artery disease) (HCC) 10/10/2020  . Osteomyelitis of fourth toe of right foot (HCC)   . S/P CABG x 4 07/24/2020  . Non-ST elevation (NSTEMI) myocardial infarction (HCC) 07/22/2020  . Acute on  chronic diastolic heart failure (HCC) 07/22/2020  . Hypotension 07/18/2020  . Left leg swelling 07/18/2020  . Elevated troponin 07/18/2020  . AKI (acute kidney injury) (HCC) 07/18/2020  . Elevated brain natriuretic peptide (BNP) level 07/18/2020  . GERD (gastroesophageal reflux disease) 07/18/2020  . Severe Sepsis secondary to Lt leg/Foot Cellulitis 07/18/2020  . Severe sepsis --Severe Sepsis secondary to Lt leg/Foot Cellulitis with hypotension and AKI 07/18/2020  . Unstable angina (HCC) 11/01/2019  . Edema 06/13/2019  . Leukocytosis 05/31/2019  . Smoker 03/08/2019  . Chronic pain 03/08/2019  . Chronic back pain 02/16/2019  . Deep venous thrombosis (HCC) 02/16/2019  . Obesity, Class III, BMI 40-49.9 (morbid obesity) (HCC) 02/16/2019  . Peripheral nerve disease 02/16/2019  . COPD (chronic obstructive pulmonary disease) (HCC) 01/22/2019  . Anxiety disorder 03/26/2018  . Open wound of toe 10/12/2017  . Sinusitis 10/12/2017  . Abdominal pain   . Loss of weight   . Diabetic foot infection (HCC) 09/16/2017  . Uncontrolled type 2 diabetes mellitus with hyperglycemia, with long-term current use of insulin (HCC) 09/16/2017  . Cellulitis of right foot   . Chronic ulcer of great toe of right foot (HCC) 09/09/2017  . Recurrent chest pain 07/29/2016  . Hyperglycemia   . Insulin dependent type 2 diabetes mellitus (HCC) 04/29/2015  . OSA (obstructive sleep apnea) 05/08/2013  . History of pulmonary embolus (PE) 04/27/2013  . CAD -S/P PCI 11/02/19  09/10/2008  . Dyslipidemia 09/09/2008   Past Medical History:  Diagnosis Date  . Anxiety   . Arthritis    "knees, shoulders, hips, ankles" (07/29/2016)  . Asthma   . CAD (coronary artery disease)    a. 2017: s/p BMS to distal Cx; b. LHC 05/30/2019: 80% mid, distal RCA s/p DES, 30% narrowing of d LM, widely patent LAD w/ luminal irregularities, widely patent stent in dCX  w/ 90+% stenosis distal to stent beofre small trifurcating obtuse marginal  (potentially area of restenosis)  . Chronic bronchitis (HCC)   . Chronic ulcer of right great toe (HCC) 09/19/2017  . COPD (chronic obstructive pulmonary disease) (HCC)   . Depression   . DVT (deep venous thrombosis) (HCC)   . GERD (gastroesophageal reflux disease)   . Hyperlipidemia   . Hypertension   . Myocardial infarction (HCC) 1996   "light one"  . PE (pulmonary embolism) 04/2013   On chronic Xarelto  . Peripheral nerve disease   . Pneumonia "several times"  . Sleep apnea   . Type II diabetes mellitus (HCC)     Family History  Problem Relation Age of Onset  . Hypertension Brother   . Hypertension Father   . Diabetes Other   . Hyperlipidemia Other     Past Surgical History:  Procedure Laterality Date  . ABDOMINAL AORTOGRAM W/LOWER EXTREMITY N/A 10/10/2020   Procedure: ABDOMINAL AORTOGRAM W/LOWER EXTREMITY;  Surgeon: Sherren KernsFields, Charles E, MD;  [facility];  Service: Cardiovascular;  Laterality: N/A;  . AMPUTATION Right 09/21/2017   Procedure: RIGHT GREAT TOE AMPUTATION, POSSIBLE VAC;  Surgeon: Tarry KosXu, Naiping M, MD;  [facility]: [facility] OR;  Service: Orthopedics;  Laterality: Right;  . AMPUTATION Left 08/13/2020   Procedure: LEFT FOOT 4TH RAY AMPUTATION;  Surgeon: Nadara Mustarduda, Marcus V, MD;  [facility];  Service: Orthopedics;  Laterality: Left;  . CARDIAC CATHETERIZATION  2006  . CARDIAC CATHETERIZATION  1996   "@ [facility]; when I had my heart attack"  . CARDIAC CATHETERIZATION N/A 07/29/2016   Procedure: Left Heart Cath and Coronary Angiography;  Surgeon: Runell GessJonathan J Berry, MD;  [facility];  Service: Cardiovascular;  Laterality: N/A;  . CARDIAC CATHETERIZATION N/A 07/29/2016   Procedure: Coronary Stent Intervention;  Surgeon: Runell GessJonathan J Berry, MD;  [facility];  Service: Cardiovascular;  Laterality: N/A;  . CARPAL TUNNEL RELEASE Bilateral   . CORONARY ANGIOPLASTY WITH STENT PLACEMENT  07/29/2016  . CORONARY ARTERY BYPASS GRAFT N/A 07/24/2020    Procedure: CORONARY ARTERY BYPASS GRAFTING (CABG), ON PUMP, TIMES FOUR, USING LEFT INTERNAL MAMMARY ARTERY AND ENDOSCOPICALLY HARVESTED RIGHT GREATER SAPHENOUS VEIN;  Surgeon: Corliss SkainsLightfoot, Harrell O, MD;  [facility] OR;  Service: Open Heart Surgery;  Laterality: N/A;  FLOW TAC  . CORONARY STENT INTERVENTION N/A 05/30/2019   Procedure: CORONARY STENT INTERVENTION;  Surgeon: Lyn RecordsSmith, Henry W, MD;  [facility];  Service: Cardiovascular;  Laterality: N/A;  . CORONARY STENT INTERVENTION N/A 11/02/2019   Procedure: CORONARY STENT INTERVENTION;  Surgeon: Iran OuchArida, Muhammad A, MD;  [facility];  Service: Cardiovascular;  Laterality: N/A;  . ESOPHAGOGASTRODUODENOSCOPY N/A 09/22/2017   Procedure: ESOPHAGOGASTRODUODENOSCOPY (EGD);  Surgeon: Meryl DareStark, Malcolm T, MD;  [facility];  Service: Endoscopy;  Laterality: N/A;  . INTRAVASCULAR PRESSURE WIRE/FFR STUDY N/A 07/22/2020   Procedure: INTRAVASCULAR PRESSURE WIRE/FFR STUDY;  Surgeon: Marykay LexHarding, David W, MD;  [facility];  Service: Cardiovascular;  Laterality: N/A;  . KNEE ARTHROSCOPY Bilateral    "2 on left; 1 on the right"  . LEFT HEART CATH AND CORONARY ANGIOGRAPHY N/A 11/02/2019   Procedure: LEFT HEART CATH AND CORONARY ANGIOGRAPHY;  Surgeon: Iran OuchArida, Muhammad A, MD;  [facility];  Service: Cardiovascular;  Laterality: N/A;  . LEFT HEART CATH AND CORONARY ANGIOGRAPHY N/A 07/22/2020   Procedure: LEFT HEART CATH AND CORONARY ANGIOGRAPHY;  Surgeon: Marykay LexHarding, David W, MD;  [facility];  Service: Cardiovascular;  Laterality: N/A;  . PERIPHERAL VASCULAR INTERVENTION Left 10/11/2020   popliteal and SFA stent placement   . PERIPHERAL VASCULAR INTERVENTION Left 10/10/2020   Procedure: PERIPHERAL VASCULAR INTERVENTION;  Surgeon: Sherren KernsFields, Charles E, MD;  [facility];  Service: Cardiovascular;  Laterality: Left;  . RIGHT/LEFT HEART CATH AND CORONARY ANGIOGRAPHY N/A 05/30/2019     Procedure: RIGHT/LEFT HEART CATH AND CORONARY ANGIOGRAPHY;  Surgeon: Lyn RecordsSmith, Henry W, MD;  [facility];  Service: Cardiovascular;  Laterality: N/A;  . SHOULDER OPEN ROTATOR CUFF REPAIR Bilateral   . TEE WITHOUT CARDIOVERSION N/A 07/24/2020   Procedure: TRANSESOPHAGEAL ECHOCARDIOGRAM (TEE);  Surgeon: Corliss Skains, MD;  Location[facility] OR;  Service: Open Heart Surgery;  Laterality: N/A;   Social History   Occupational History  . Occupation: disabled     Associate Professor: UNEMPLOYED  Tobacco Use  . Smoking status: Former Smoker    Packs/day: 1.00    Years: 34.00    Pack years: 34.00    Types: Cigarettes    Quit date: 07/12/2020    Years since quitting: 0.3  . Smokeless tobacco: Former Neurosurgeon    Types: Snuff, Chew  Vaping Use  . Vaping Use: Never used  Substance and Sexual Activity  . Alcohol use: Yes    Comment: RARE  . Drug use: No  . Sexual activity: Yes

## 2020-11-03 ENCOUNTER — Encounter: Payer: Medicare Other | Admitting: Vascular Surgery

## 2020-11-03 ENCOUNTER — Other Ambulatory Visit: Payer: Self-pay | Admitting: General Practice

## 2020-11-10 ENCOUNTER — Encounter: Payer: Self-pay | Admitting: Physician Assistant

## 2020-11-10 ENCOUNTER — Ambulatory Visit (INDEPENDENT_AMBULATORY_CARE_PROVIDER_SITE_OTHER): Payer: Medicare Other | Admitting: Orthopedic Surgery

## 2020-11-10 VITALS — Ht 77.0 in | Wt 353.0 lb

## 2020-11-10 DIAGNOSIS — Z89422 Acquired absence of other left toe(s): Secondary | ICD-10-CM

## 2020-11-11 ENCOUNTER — Encounter: Payer: Self-pay | Admitting: Orthopedic Surgery

## 2020-11-11 ENCOUNTER — Telehealth: Payer: Self-pay | Admitting: General Practice

## 2020-11-11 DIAGNOSIS — Z5181 Encounter for therapeutic drug level monitoring: Secondary | ICD-10-CM

## 2020-11-11 DIAGNOSIS — I10 Essential (primary) hypertension: Secondary | ICD-10-CM

## 2020-11-11 DIAGNOSIS — R600 Localized edema: Secondary | ICD-10-CM

## 2020-11-11 NOTE — Telephone Encounter (Signed)
Spoke to patient.  He states  Both  Legs are swollen like  "water balloon"  For last 2 weeks  - patient states constantly swollen . He states they do not go down at night.  Keeps legs elevated  In a recliner . Since he has had an infection in left foot. Left foot and leg is wrapped with little drainage. Right foot is somewhat red per patient's son no weeping . Patient states he does not have Feeling in legs due to neuropathy   So no pain noted. Right foot is not weeping, no fever noted.  today's blood pressure  179/109  Pulse 101.  About 4 pm  158/97  Pulse pulse 102.    patient is not on any diuretic.   He was taking HCTZ prior to his CABG surgery .   Patient has not weighed himself daily. Recommend weighing daily . No shortness of breathe with rest or chest pain noted. patient is aware if symptoms become worse go to ER.   patient aware will defer to provider.

## 2020-11-11 NOTE — Telephone Encounter (Signed)
Pt c/o swelling: STAT is pt has developed SOB within 24 hours  1) How much weight have you gained and in what time span? Not sure   2) If swelling, where is the swelling located? Legs   3) Are you currently taking a fluid pill? No   4) Are you currently SOB? Sometimes, but not right now has had for years   5) Do you have a log of your daily weights (if so, list)? No   6) Have you gained 3 pounds in a day or 5 pounds in a week? A little more than 5 lbs in a week   7) Have you traveled recently? No   BP 179/109 HR 101 Vickie states when he presses his finger on his legs it goes in about a quarter of an inch. Please advise.

## 2020-11-11 NOTE — Progress Notes (Signed)
Office Visit Note   Patient: Joseph [name]           Date of Birth: [birth year]           MRN: [id number] Visit Date: 11/10/2020              Requested by: Marva Panda, NP [address] [place],  [name] [address] PCP: Marva Panda, NP  Chief Complaint  Patient presents with  . Left Foot - Follow-up    08/13/20 left foot 4th ray amputation       HPI: Patient is a 49 year old gentleman who presents in follow-up status post a left foot fourth ray amputation 3 months ago.  He has been using Silvadene for dressing changes nonweightbearing in a postoperative shoe he states he does have a little bit of drainage and swelling  Assessment & Plan: Visit Diagnoses:  1. History of complete ray amputation of fourth toe of left foot (HCC)     Plan: Patient does have significant venous stasis insufficiency.  He was given samples of compression socks to be worn around-the-clock he states he cannot afford new socks  Follow-Up Instructions: Return in about 4 weeks (around 12/08/2020).   Ortho Exam  Patient is alert, oriented, no adenopathy, well-dressed, normal affect, normal respiratory effort. Examination there is complete healing of the dorsal incision there is healthy granulation tissue 3 cm in diameter over the plantar aspect of the foot.  Imaging: No results found. No images are attached to the encounter.  Labs: Lab Results  Component Value Date   HGBA1C 9.4 (H) 07/18/2020   HGBA1C 8.2 (H) 11/02/2019   HGBA1C 8.2 (H) 11/01/2019   ESRSEDRATE 41 (H) 09/16/2017   CRP 8.9 (H) 09/16/2017   REPTSTATUS 07/23/2020 FINAL 07/18/2020   REPTSTATUS 07/23/2020 FINAL 07/18/2020   CULT  07/18/2020    NO GROWTH 5 DAYS Performed at [facility], [address] [place], [name] [address]    CULT  07/18/2020    NO GROWTH 5 DAYS Performed at [facility], [address], [place], [name] [address]      Lab Results  Component Value Date   ALBUMIN 2.9 (L) 07/27/2020    ALBUMIN 2.9 (L) 07/26/2020   ALBUMIN 3.0 (L) 07/21/2020    Lab Results  Component Value Date   MG 2.0 07/25/2020   MG 2.1 07/25/2020   MG 2.3 07/24/2020   No results found for: VD25OH  No results found for: PREALBUMIN CBC EXTENDED Latest Ref Rng & Units 10/11/2020 10/10/2020 08/13/2020  WBC 4.0 - 10.5 K/uL 8.2 - -  RBC 4.22 - 5.81 MIL/uL 3.98(L) - -  HGB 13.0 - 17.0 g/dL 4.0(C) 11.9(L) 8.8(L)  HCT 39 - 52 % 32.9(L) 35.0(L) 26.0(L)  PLT 150 - 400 K/uL 280 - -  NEUTROABS 1.7 - 7.7 K/uL - - -  LYMPHSABS 0.7 - 4.0 K/uL - - -     Body mass index is 41.86 kg/m.  Orders:  No orders of the defined types were placed in this encounter.  No orders of the defined types were placed in this encounter.    Procedures: No procedures performed  Clinical Data: No additional findings.  ROS:  All other systems negative, except as noted in the HPI. Review of Systems  Objective: Vital Signs: Ht 6\' 5"  (1.956 m)   Wt (!) 353 lb (160.1 kg)   BMI 41.86 kg/m   Specialty Comments:  No specialty comments available.  PMFS History: Patient Active Problem List  Diagnosis Date Noted  . PAD (peripheral artery disease) (HCC) 10/10/2020  . Osteomyelitis of fourth toe of right foot (HCC)   . S/P CABG x 4 07/24/2020  . Non-ST elevation (NSTEMI) myocardial infarction (HCC) 07/22/2020  . Acute on chronic diastolic heart failure (HCC) 07/22/2020  . Hypotension 07/18/2020  . Left leg swelling 07/18/2020  . Elevated troponin 07/18/2020  . AKI (acute kidney injury) (HCC) 07/18/2020  . Elevated brain natriuretic peptide (BNP) level 07/18/2020  . GERD (gastroesophageal reflux disease) 07/18/2020  . Severe Sepsis secondary to Lt leg/Foot Cellulitis 07/18/2020  . Severe sepsis --Severe Sepsis secondary to Lt leg/Foot Cellulitis with hypotension and AKI 07/18/2020  . Unstable angina (HCC) 11/01/2019  . Edema 06/13/2019  . Leukocytosis 05/31/2019  . Smoker 03/08/2019  . Chronic pain 03/08/2019    . Chronic back pain 02/16/2019  . Deep venous thrombosis (HCC) 02/16/2019  . Obesity, Class III, BMI 40-49.9 (morbid obesity) (HCC) 02/16/2019  . Peripheral nerve disease 02/16/2019  . COPD (chronic obstructive pulmonary disease) (HCC) 01/22/2019  . Anxiety disorder 03/26/2018  . Open wound of toe 10/12/2017  . Sinusitis 10/12/2017  . Abdominal pain   . Loss of weight   . Diabetic foot infection (HCC) 09/16/2017  . Uncontrolled type 2 diabetes mellitus with hyperglycemia, with long-term current use of insulin (HCC) 09/16/2017  . Cellulitis of right foot   . Chronic ulcer of great toe of right foot (HCC) 09/09/2017  . Recurrent chest pain 07/29/2016  . Hyperglycemia   . Insulin dependent type 2 diabetes mellitus (HCC) 04/29/2015  . OSA (obstructive sleep apnea) 05/08/2013  . History of pulmonary embolus (PE) 04/27/2013  . CAD -S/P PCI 11/02/19  09/10/2008  . Dyslipidemia 09/09/2008   Past Medical History:  Diagnosis Date  . Anxiety   . Arthritis    "knees, shoulders, hips, ankles" (07/29/2016)  . Asthma   . CAD (coronary artery disease)    a. 2017: s/p BMS to distal Cx; b. LHC 05/30/2019: 80% mid, distal RCA s/p DES, 30% narrowing of d LM, widely patent LAD w/ luminal irregularities, widely patent stent in dCX  w/ 90+% stenosis distal to stent beofre small trifurcating obtuse marginal (potentially area of restenosis)  . Chronic bronchitis (HCC)   . Chronic ulcer of right great toe (HCC) 09/19/2017  . COPD (chronic obstructive pulmonary disease) (HCC)   . Depression   . DVT (deep venous thrombosis) (HCC)   . GERD (gastroesophageal reflux disease)   . Hyperlipidemia   . Hypertension   . Myocardial infarction (HCC) 1996   "light one"  . PE (pulmonary embolism) 04/2013   On chronic Xarelto  . Peripheral nerve disease   . Pneumonia "several times"  . Sleep apnea   . Type II diabetes mellitus (HCC)     Family History  Problem Relation Age of Onset  . Hypertension Brother    . Hypertension Father   . Diabetes Other   . Hyperlipidemia Other     Past Surgical History:  Procedure Laterality Date  . ABDOMINAL AORTOGRAM W/LOWER EXTREMITY N/A 10/10/2020   Procedure: ABDOMINAL AORTOGRAM W/LOWER EXTREMITY;  Surgeon: Sherren Kerns, MD;  Location: [facility] INVASIVE CV LAB;  Service: Cardiovascular;  Laterality: N/A;  . AMPUTATION Right 09/21/2017   Procedure: RIGHT GREAT TOE AMPUTATION, POSSIBLE VAC;  Surgeon: Tarry Kos, MD;  Location: [facility] OR;  Service: Orthopedics;  Laterality: Right;  . AMPUTATION Left 08/13/2020   Procedure: LEFT FOOT 4TH RAY AMPUTATION;  Surgeon: [name], MD;  Location: [facility] OR;  Service: Orthopedics;  Laterality: Left;  . CARDIAC CATHETERIZATION  2006  . CARDIAC CATHETERIZATION  1996   "@ [facility]; when I had my heart attack"  . CARDIAC CATHETERIZATION N/A 07/29/2016   Procedure: Left Heart Cath and Coronary Angiography;  Surgeon: Runell Gess, MD;  Location: [facility] INVASIVE CV LAB;  Service: Cardiovascular;  Laterality: N/A;  . CARDIAC CATHETERIZATION N/A 07/29/2016   Procedure: Coronary Stent Intervention;  Surgeon: Runell Gess, MD;  Location: [facility] INVAS[facility] CV [facility];  Service: Cardiovascular;  Laterality: N/A;  . CARPAL TUNNEL RELEASE Bilateral   . CORONARY ANGIOPLASTY WITH STENT PLACEMENT  07/29/2016  . CORONARY ARTERY BYPASS GRAFT N/A 07/24/2020   Procedure: CORONARY ARTERY BYPASS GRAFTING (CABG), ON PUMP, TIMES FOUR, USING LEFT INTERNAL MAMMARY ARTERY AND ENDOSCOPICALLY HARVESTED RIGHT GREATER SAPHENOUS VEIN;  Surgeon: Corliss Skains, MD;  Location: [facility] OR;  Service: Open Heart Surgery;  Laterality: N/A;  FLOW TAC  . CORONARY STENT INTERVENTION N/A 05/30/2019   Procedure: CORONARY STENT INTERVENTION;  Surgeon: Lyn Records, MD;  Location: [facility] INVASIVE CV LAB;  Service: Cardiovascular;  Laterality: N/A;  . CORONARY STENT INTERVENTION N/A 11/02/2019   Procedure: CORONARY STENT INTERVENTION;  Surgeon: Iran Ouch, MD;  Location: [facility]  INVASIVE CV LAB;  Service: Cardiovascular;  Laterality: N/A;  . ESOPHAGOGASTRODUODENOSCOPY N/A 09/22/2017   Procedure: ESOPHAGOGASTRODUODENOSCOPY (EGD);  Surgeon: Meryl Dare, MD;  Location: [facility] ENDOSCOPY;  Service: Endoscopy;  Laterality: N/A;  . INTRAVASCULAR PRESSURE WIRE/FFR STUDY N/A 07/22/2020   Procedure: INTRAVASCULAR PRESSURE WIRE/FFR STUDY;  Surgeon: Marykay Lex, MD;  Location: [facility] INVASIVE CV LAB;  Service: Cardiovascular;  Laterality: N/A;  . KNEE ARTHROSCOPY Bilateral    "2 on left; 1 on the right"  . LEFT HEART CATH AND CORONARY ANGIOGRAPHY N/A 11/02/2019   Procedure: LEFT HEART CATH AND CORONARY ANGIOGRAPHY;  Surgeon: Iran Ouch, MD;  Location: [facility] IN[facility] CV LAB;  Service: Cardiovascular;  Laterality: N/A;  . LEFT HEART CATH AND CORONARY ANGIOGRAPHY N/A 07/22/2020   Procedure: LEFT HEART CATH AND CORONARY ANGIOGRAPHY;  Surgeon: Marykay Lex, MD;  Location: [facility] INVASIVE CV LAB;  Service: Cardiovascular;  Laterality: N/A;  . PERIPHERAL VASCULAR INTERVENTION Left 10/11/2020   popliteal and SFA stent placement   . PERIPHERAL VASCULAR INTERVENTION Left 10/10/2020   Procedure: PERIPHERAL VASCULAR INTERVENTION;  Surgeon: Sherren Kerns, MD;  Location: [facility] INVASIVE CV LAB;  Service: Cardiovascular;  Laterality: Left;  . RIGHT/LEFT HEART CATH AND CORONARY ANGIOGRAPHY N/A 05/30/2019   Procedure: RIGHT/LEFT HEART CATH AND CORONARY ANGIOGRAPHY;  Surgeon: Lyn Records, MD;  Location: [facility] INVASIVE CV LAB;  Service: Cardiovascular;  Laterality: N/A;  . SHOULDER OPEN ROTATOR CUFF REPAIR Bilateral   . TEE WITHOUT CARDIOVERSION N/A 07/24/2020   Procedure: TRANSESOPHAGEAL ECHOCARDIOGRAM (TEE);  Surgeon: Corliss Skains, MD;  Location: [facility] OR;  Service: Open Heart Surgery;  Laterality: N/A;   Social History   Occupational History  . Occupation: disabled     Associate Professor: UNEMPLOYED  Tobacco Use  . Smoking status: Former Smoker    Packs/day: 1.00    Years: 34.00    Pack years:  34.00    Types: Cigarettes    Quit date: 07/12/2020    Years since quitting: 0.3  . Smokeless tobacco: Former Neurosurgeon    Types: Snuff, Chew  Vaping Use  . Vaping Use: Never used  Substance and Sexual Activity  . Alcohol use: Yes    Comment: RARE  . Drug use: No  .  Sexual activity: Yes

## 2020-11-12 MED ORDER — FUROSEMIDE 40 MG PO TABS: 40.0000 mg | ORAL_TABLET | Freq: Every day | ORAL | 1 refills | Status: DC

## 2020-11-12 NOTE — Telephone Encounter (Signed)
Recommend lasix 40mg  daily.  Check BMP and BNP on Monday.  Limit salt and <2L fluid.  He needs to be seen in office within a week if possible.

## 2020-11-12 NOTE — Telephone Encounter (Signed)
Advised patient, verbalized understanding Scheduled appointment for 12/2

## 2020-11-17 ENCOUNTER — Ambulatory Visit: Payer: Medicare Other | Admitting: Physician Assistant

## 2020-11-20 ENCOUNTER — Other Ambulatory Visit: Payer: Self-pay

## 2020-11-20 ENCOUNTER — Ambulatory Visit (INDEPENDENT_AMBULATORY_CARE_PROVIDER_SITE_OTHER): Payer: Medicare Other | Admitting: Cardiovascular Disease

## 2020-11-20 ENCOUNTER — Encounter: Payer: Self-pay | Admitting: Cardiovascular Disease

## 2020-11-20 VITALS — BP 130/83 | HR 97 | Temp 98.2°F | Ht 77.0 in | Wt 353.0 lb

## 2020-11-20 DIAGNOSIS — Z9861 Coronary angioplasty status: Secondary | ICD-10-CM

## 2020-11-20 DIAGNOSIS — R609 Edema, unspecified: Secondary | ICD-10-CM | POA: Diagnosis not present

## 2020-11-20 DIAGNOSIS — I5043 Acute on chronic combined systolic (congestive) and diastolic (congestive) heart failure: Secondary | ICD-10-CM | POA: Diagnosis not present

## 2020-11-20 DIAGNOSIS — I2510 Atherosclerotic heart disease of native coronary artery without angina pectoris: Secondary | ICD-10-CM | POA: Diagnosis not present

## 2020-11-20 DIAGNOSIS — G4733 Obstructive sleep apnea (adult) (pediatric): Secondary | ICD-10-CM

## 2020-11-20 DIAGNOSIS — I739 Peripheral vascular disease, unspecified: Secondary | ICD-10-CM

## 2020-11-20 DIAGNOSIS — R0602 Shortness of breath: Secondary | ICD-10-CM | POA: Diagnosis not present

## 2020-11-20 DIAGNOSIS — E785 Hyperlipidemia, unspecified: Secondary | ICD-10-CM

## 2020-11-20 DIAGNOSIS — I5033 Acute on chronic diastolic (congestive) heart failure: Secondary | ICD-10-CM

## 2020-11-20 MED ORDER — POTASSIUM CHLORIDE CRYS ER 20 MEQ PO TBCR: EXTENDED_RELEASE_TABLET | ORAL | 5 refills | Status: DC

## 2020-11-20 NOTE — Patient Instructions (Addendum)
Medication Instructions:  START POTASSIUM 20 MEQ 2 TABLETS DAILY   DECREASE YOUR ASPIRIN TO 81 MG DAILY   *If you need a refill on your cardiac medications before your next appointment, please call your pharmacy*  Lab Work: BNP/BMET TODAY   If you have labs (blood work) drawn today and your tests are completely normal, you will receive your results only by: Marland Kitchen MyChart Message (if you have MyChart) OR . A paper copy in the mail If you have any lab test that is abnormal or we need to change your treatment, we will call you to review the results.  Testing/Procedures: Your physician has requested that you have an echocardiogram. Echocardiography is a painless test that uses sound waves to create images of your heart. It provides your doctor with information about the size and shape of your heart and how well your heart's chambers and valves are working. This procedure takes approximately one hour. There are no restrictions for this procedure. IN [place]   Follow-Up: At [facility], you and your health needs are our priority.  As part of our continuing mission to provide you with exceptional heart care, we have created designated Provider Care Teams.  These Care Teams include your primary Cardiologist (physician) and Advanced Practice Providers (APPs -  Physician Assistants and Nurse Practitioners) who all work together to provide you with the care you need, when you need it.  We recommend signing up for the patient portal called "MyChart".  Sign up information is provided on this After Visit Summary.  MyChart is used to connect with patients for Virtual Visits (Telemedicine).  Patients are able to view lab/test results, encounter notes, upcoming appointments, etc.  Non-urgent messages can be sent to your provider as well.   To learn more about what you can do with MyChart, go to [email or web address].    Your next appointment:   2 month(s)  The format for your next appointment:   In  Person  Provider:   You may see Chilton Si, MD or one of the following Advanced Practice Providers on your designated Care Team:    Corine Shelter, PA-C  [place], [place]  Edd Fabian, [place]

## 2020-11-20 NOTE — Progress Notes (Signed)
Cardiology Office Note   Date:  01/02/2021   ID:  Joseph [name], DOB October 06, 1971, MRN [id number]  PCP:  [name], NP  Cardiologist:   Skeet Latch, MD   No chief complaint on file.    History of Present Illness: Joseph [name] is a 49 y.o. male with CAD s/p LCx PCI 07/2016,  HTN, HL, DM Type II (A1c 7.1 07/2015), pror PE, and OSA who presents for follow up.  He was was first seen 07/2015 for pre-operative clearance prior to right knee and left hip surgery.  At that time he was feeling well but was unable to exercise due to orthopedic limitations, so he was referred for [facility] 08/12/15 that revealed LVEF 50% and a small area in the basal inferolateral region thought to be either soft tissue attenuation vs prior infarct with mild peri-infarct ischemia.  He was seen in the ED 07/2016 with unstable angina.  He underwent LHC 07/29/16 and was found to have an 80% LCx lesion that was successfully stented.  He followed up with Almyra Deforest, PA-C on 09/06/16, At which time he continued to report episodes of chest pain. He was referred for stress test but has not yet completed this. He reports that his son has kidney stones and he and his wife have been busy taking him to his appointments and procedures. He continues to have episodes of exertional chest discomfort. He also notes that when he is angry or upset. He responds after 2 or 3 nitroglycerin tablets. The pain is substernal and on the left side of his chest. It does not radiate. There is associated shortness of breath and nausea, though he is nauseous at baseline. He also endorses diaphoresis, though he states that this is a chronic issue as well.  When he has chest pain he feels as though he can't take a deep breath.  He continues to smoke half a pack of cigarettes daily. He is not interested in time to quit again at this time. He previously used nitroglycerin patches without success. He has been on Wellbutrin for years.  He was seen  at [facility] for chest pain and hypotension on 06/2020.  Echo at that time revealed LVEF 50 to 55% with normal diastolic function.  He underwent coronary artery bypass grafting (LIMA-->LAD, SVG-->PDA, OM1, D1) 07/2020.  He had a partial foot amputation later in the month.  He sees Dr. Oneida Alar and had an abdominal aortogram and LE runoff on 09/2020.  He was found to have multisegment stenosis of the left popliteal artery and left SFA.  Stents were successfully placed in these regions.  In the right leg there were 50 to 70% stenoses in the right SFA with one-vessel runoff in the posterior tibial.  He had 50 to 90% stenoses in the left SFA and 2 segments of 90% stenosis in the popliteal.  These regions were also dilated and stented.  There was a dissection that was managed with further stenting.  He was started on Lasix due to lower extremity edema.  He has lost over 6 pounds since starting the Lasix.  He also thinks that this is help with his wound healing.  However he continues to have volume overload and some shortness of breath.  He has had constant chest pain since his CABG.  It does not change with exertion but does change with movement.   Past Medical History:  Diagnosis Date  . Anxiety   . Arthritis    "knees,  shoulders, hips, ankles" (07/29/2016)  . Asthma   . CAD (coronary artery disease)    a. 2017: s/p BMS to distal Cx; b. LHC 05/30/2019: 80% mid, distal RCA s/p DES, 30% narrowing of d LM, widely patent LAD w/ luminal irregularities, widely patent stent in [employer]  w/ 90+% stenosis distal to stent beofre small trifurcating obtuse marginal (potentially area of restenosis)  . Chronic bronchitis ([place])   . Chronic ulcer of right great toe ([place]) 09/19/2017  . COPD (chronic obstructive pulmonary disease) ([place])   . Depression   . DVT (deep venous thrombosis) ([place])   . GERD (gastroesophageal reflux disease)   . Hyperlipidemia   . Hypertension   . Myocardial infarction ([place]) 1996   "light one"  . PE  (pulmonary embolism) 04/2013   On chronic Xarelto  . Peripheral nerve disease   . Pneumonia "several times"  . Sleep apnea   . Type II diabetes mellitus ([place])     Past Surgical History:  Procedure Laterality Date  . ABDOMINAL AORTOGRAM W/LOWER EXTREMITY N/A 10/10/2020   Procedure: ABDOMINAL AORTOGRAM W/LOWER EXTREMITY;  Surgeon: Elam Dutch, MD;  Location: [place] [facility];  Service: Cardiovascular;  Laterality: N/A;  . AMPUTATION Right 09/21/2017   Procedure: RIGHT GREAT TOE AMPUTATION, POSSIBLE VAC;  Surgeon: Leandrew Koyanagi, MD;  Location: [place];  Service: Orthopedics;  Laterality: Right;  . AMPUTATION Left 08/13/2020   Procedure: LEFT FOOT 4TH RAY AMPUTATION;  Surgeon: Newt Minion, MD;  Location: [place];  Service: Orthopedics;  Laterality: Left;  . CARDIAC CATHETERIZATION  2006  . CARDIAC CATHETERIZATION  1996   "@ [facility]; when I had my heart attack"  . CARDIAC CATHETERIZATION N/A 07/29/2016   Procedure: Left Heart Cath and Coronary Angiography;  Surgeon: Lorretta Harp, MD;  Location: [place] [facility];  Service: Cardiovascular;  Laterality: N/A;  . CARDIAC CATHETERIZATION N/A 07/29/2016   Procedure: Coronary Stent Intervention;  Surgeon: Lorretta Harp, MD;  Location: [place] [facility];  Service: Cardiovascular;  Laterality: N/A;  . CARPAL TUNNEL RELEASE Bilateral   . CORONARY ANGIOPLASTY WITH STENT PLACEMENT  07/29/2016  . CORONARY ARTERY BYPASS GRAFT N/A 07/24/2020   Procedure: CORONARY ARTERY BYPASS GRAFTING (CABG), ON PUMP, TIMES FOUR, USING LEFT INTERNAL MAMMARY ARTERY AND ENDOSCOPICALLY HARVESTED RIGHT GREATER SAPHENOUS VEIN;  Surgeon: Lajuana Matte, MD;  Location: [place];  Service: Open Heart Surgery;  Laterality: N/A;  FLOW TAC  . CORONARY STENT INTERVENTION N/A 05/30/2019   Procedure: CORONARY STENT INTERVENTION;  Surgeon: Belva Crome, MD;  Location: [place] [facility];  Service: Cardiovascular;  Laterality: N/A;  . CORONARY STENT INTERVENTION N/A 11/02/2019    Procedure: CORONARY STENT INTERVENTION;  Surgeon: Wellington Hampshire, MD;  Location: [place] [facility];  Service: Cardiovascular;  Laterality: N/A;  . ESOPHAGOGASTRODUODENOSCOPY N/A 09/22/2017   Procedure: ESOPHAGOGASTRODUODENOSCOPY (EGD);  Surgeon: Ladene Artist, MD;  Location: [facility];  Service: Endoscopy;  Laterality: N/A;  . INTRAVASCULAR PRESSURE WIRE/FFR STUDY N/A 07/22/2020   Procedure: INTRAVASCULAR PRESSURE WIRE/FFR STUDY;  Surgeon: Leonie Man, MD;  Location: [place] [facility];  Service: Cardiovascular;  Laterality: N/A;  . KNEE ARTHROSCOPY Bilateral    "2 on left; 1 on the right"  . LEFT HEART CATH AND CORONARY ANGIOGRAPHY N/A 11/02/2019   Procedure: LEFT HEART CATH AND CORONARY ANGIOGRAPHY;  Surgeon: Wellington Hampshire, MD;  Location: [place] [facility];  Service: Cardiovascular;  Laterality: N/A;  . LEFT HEART CATH AND CORONARY  ANGIOGRAPHY N/A 07/22/2020   Procedure: LEFT HEART CATH AND CORONARY ANGIOGRAPHY;  Surgeon: Leonie Man, MD;  Location: [place] [facility];  Service: Cardiovascular;  Laterality: N/A;  . PERIPHERAL VASCULAR INTERVENTION Left 10/11/2020   popliteal and SFA stent placement   . PERIPHERAL VASCULAR INTERVENTION Left 10/10/2020   Procedure: PERIPHERAL VASCULAR INTERVENTION;  Surgeon: Elam Dutch, MD;  Location: [place] [facility];  Service: Cardiovascular;  Laterality: Left;  . RIGHT/LEFT HEART CATH AND CORONARY ANGIOGRAPHY N/A 05/30/2019   Procedure: RIGHT/LEFT HEART CATH AND CORONARY ANGIOGRAPHY;  Surgeon: Belva Crome, MD;  Location: [place] [facility];  Service: Cardiovascular;  Laterality: N/A;  . SHOULDER OPEN ROTATOR CUFF REPAIR Bilateral   . TEE WITHOUT CARDIOVERSION N/A 07/24/2020   Procedure: TRANSESOPHAGEAL ECHOCARDIOGRAM (TEE);  Surgeon: Lajuana Matte, MD;  Location: [place];  Service: Open Heart Surgery;  Laterality: N/A;     Current Outpatient Medications  Medication Sig Dispense Refill  . albuterol (VENTOLIN HFA) 108  (90 Base) MCG/ACT inhaler Inhale 2 puffs into the lungs every 6 (six) hours as needed for wheezing or shortness of breath.    . ALPRAZolam (XANAX) 1 MG tablet Take 1 mg by mouth See admin instructions. Take 1 mg as needed daily anxiety  and 1 mg at bedtime for sleep    . aspirin EC 81 MG tablet Take 81 mg by mouth daily. Swallow whole.    Marland Kitchen atorvastatin (LIPITOR) 80 MG tablet Take 1 tablet (80 mg total) by mouth daily at 6 PM. 30 tablet 2  . blood glucose meter kit and supplies KIT Dispense based on patient and insurance preference. Use up to four times daily as directed. (FOR ICD-9 250.00, 250.01). 1 each 0  . buPROPion (WELLBUTRIN SR) 150 MG 12 hr tablet Take 150 mg by mouth 2 (two) times daily.   0  . carvedilol (COREG) 3.125 MG tablet TAKE 1 TABLET(3.125 MG) BY MOUTH TWICE DAILY (Patient taking differently: Take 3.125 mg by mouth 2 (two) times daily with a meal.) 180 tablet 1  . clopidogrel (PLAVIX) 75 MG tablet Take 1 tablet (75 mg total) by mouth daily with breakfast. 30 tablet 3  . doxycycline (VIBRA-TABS) 100 MG tablet Take 1 tablet (100 mg total) by mouth 2 (two) times daily. (Patient taking differently: Take 100 mg by mouth 2 (two) times daily. continuous) 60 tablet 0  . escitalopram (LEXAPRO) 10 MG tablet Take 10 mg by mouth daily.    . fenofibrate (TRICOR) 145 MG tablet Take 145 mg by mouth daily.    . fluconazole (DIFLUCAN) 100 MG tablet Take 100 mg by mouth every Monday.    . fluticasone (FLONASE) 50 MCG/ACT nasal spray Place 2 sprays into both nostrils daily as needed for allergies.    . furosemide (LASIX) 40 MG tablet Take 1 tablet (40 mg total) by mouth daily. 90 tablet 1  . HUMALOG KWIKPEN 100 UNIT/ML SOPN Inject 15-40 Units into the skin 3 (three) times daily with meals. sliding scale    . LANTUS SOLOSTAR 100 UNIT/ML Solostar Pen Inject 40 Units into the skin 2 (two) times daily. 15 mL 11  . meclizine (ANTIVERT) 25 MG tablet Take 25-50 mg by mouth See admin instructions. Take 50 mg  by mouth in the morning & take  25 mg by mouth at night.    . metFORMIN (GLUCOPHAGE) 1000 MG tablet Take 1 tablet (1,000 mg total) by mouth 2 (two) times daily with a meal.    .  oxyCODONE (ROXICODONE) 15 MG immediate release tablet Take 15 mg by mouth 5 (five) times daily.     . pantoprazole (PROTONIX) 40 MG tablet Take 1 tablet (40 mg total) by mouth daily. (Patient taking differently: Take 80 mg by mouth daily.) 30 tablet 0  . pregabalin (LYRICA) 150 MG capsule Take 150 mg by mouth 3 (three) times daily.     Marland Kitchen VICTOZA 18 MG/3ML SOPN Inject 1.2 mg into the skin at bedtime.    . hydrochlorothiazide (MICROZIDE) 12.5 MG capsule TAKE 1 CAPSULE(12.5 MG) BY MOUTH DAILY 90 capsule 3  . potassium chloride SA (KLOR-CON) 20 MEQ tablet TAKE 2 BY MOUTH DAILY 60 tablet 5   No current facility-administered medications for this visit.    Allergies:   Sulfa antibiotics    Social History:  The patient  reports that he quit smoking about 5 months ago. His smoking use included cigarettes. He has a 34.00 pack-year smoking history. He has quit using smokeless tobacco.  His smokeless tobacco use included snuff and chew. He reports current alcohol use. He reports that he does not use drugs.   Family History:  The patient's family history includes Diabetes in an other family member; Hyperlipidemia in an other family member; Hypertension in his brother and father.    ROS:  Please see the history of present illness.   Otherwise, review of systems are positive for none.   All other systems are reviewed and negative.    PHYSICAL EXAM: VS:  BP 130/83   Pulse 97   Temp 98.2 F (36.8 C)   Ht 6' 5" (1.956 m)   Wt (!) 353 lb (160.1 kg)   SpO2 98%   BMI 41.86 kg/m  , BMI Body mass index is 41.86 kg/m. GENERAL:  Well appearing.  No acute distress HEENT:  Pupils equal round and reactive, fundi not visualized, oral mucosa unremarkable NECK:  No jugular venous distention, waveform within normal limits, carotid  upstroke brisk and symmetric CHEST: Well-healed median sternotomy LUNGS:  Clear to auscultation bilaterally HEART:  RRR.  PMI not displaced or sustained,S1 and S2 within normal limits, no S3, no S4, no clicks, no rubs, no murmurs ABD:  Flat, positive bowel sounds normal in frequency in pitch, no bruits, no rebound, no guarding, no midline pulsatile mass, no hepatomegaly, no splenomegaly EXT:  2 plus pulses throughout, 1+ LE edema, no cyanosis no clubbing.  Partial L foot amputation SKIN:  No rashes no nodules NEURO:  Cranial nerves II through XII grossly intact, motor grossly intact throughout PSYCH:  Cognitively intact, oriented to person place and time  EKG:  EKG is ordered today. The ekg ordered 08/27/16 demonstrates sinus tachycardia at 110 bpm.  Absent R wave progression.  Non-specific ST elevation V2.  Prior inferior MI. 09/29/16: Sinus rhythm. Rate 99 bpm. Left axis deviation. Prior inferior infarct. Prior anterior infarct. 11/20/20: Sinus rhythm.  Rate 97 bpm.  Low voltage.  Prior inferior infarct.  Prior anteroseptal infarct.  LAFB.   Echo 07/30/16: Study Conclusions  - Left ventricle: The cavity size was normal. Wall thickness was   increased in a pattern of mild LVH. Systolic function was normal.   The estimated ejection fraction was in the range of 55% to 60%.   Wall motion was normal; there were no regional wall motion   abnormalities. Doppler parameters are consistent with abnormal   left ventricular relaxation (grade 1 diastolic dysfunction). - Aortic root: The aortic root was mildly dilated.  Impressions:  -  Technically difficult; definity used; normal LV systolic   function; grade 1 diastolic dysfunction; indeterminant filling   pressure; fusion of left and right aortic cusps; no AS or AI by   doppler; mildly dilated aortic root.   LHC 07/29/16:  Dist Cx lesion, 80 %stenosed.  Post intervention, there is a 0% residual stenosis.  A stent was successfully  placed.   Recent Labs: 07/25/2020: Magnesium 2.0 07/27/2020: ALT 73 10/11/2020: Hemoglobin 9.5; Platelets 280 11/20/2020: BNP 77.9; BUN 30; Creatinine, Ser 1.34; Potassium 5.5; Sodium 140    Lipid Panel    Component Value Date/Time   CHOL 158 07/19/2020 0459   TRIG 187 (H) 07/19/2020 0459   HDL 20 (L) 07/19/2020 0459   CHOLHDL 7.9 07/19/2020 0459   VLDL 37 07/19/2020 0459   LDLCALC 101 (H) 07/19/2020 0459   LDLDIRECT 120.0 05/28/2015 0951      Wt Readings from Last 3 Encounters:  12/29/20 (!) 373 lb (169.2 kg)  12/08/20 (!) 353 lb (160.1 kg)  11/20/20 (!) 353 lb (160.1 kg)      Other studies Reviewed: Additional studies/ records that were reviewed today include:  Review of the above records demonstrates:  Please see elsewhere in the note.    ASSESSMENT AND PLAN:  # CAD s/p PCI and CABG: # Hyperlipidemia:  CABG 07/2020.  Mr. Devins continues to have chest pain that is not exertional.  This does not seem to be ischemic.  It has been several months in surgery.  We will reduce his aspirin to 81 mg.  Continue carvedilol, clopidogrel, atorvastatin, and fenofibrate.  LDL goal is less than 70.  We will need to repeat at follow-up.  Consider Repatha or Praluent lipids remain above goal.  # Shortness of breath: # LE edema; It is unclear how much of this is venous stasis versus diastolic heart failure.  Check BMP.  Repeat echo.  Continue Lasix.  # PAD:  S/p multiple stents.  Continue follow-up with vascular.  # Hypertension:  Blood pressure will have been controlled.  Potassium has been low.  We will start potassium chloride 40 mEq daily.  # Smoking cessation:  We discussed tobacco use.  Mr. Recore is continuing to cut back on his smoking.  He is not interested in any other assistance.  We will check fasting lipids and a CMP.  Continue atorvastatin.   Current medicines are reviewed at length with the patient today.  The patient does not have concerns regarding  medicines.  The following changes have been made:  no change  Labs/ tests ordered today include:  Orders Placed This Encounter  Procedures  . Basic metabolic panel  . B Nat Peptide  . EKG 12-Lead  . ECHOCARDIOGRAM COMPLETE     Disposition:   FU with Dr. Jonelle Sidle C. [place] in 2 months    Signed, Skeet Latch, MD  01/02/2021 10:17 AM    [place]

## 2020-11-21 LAB — BASIC METABOLIC PANEL
BUN/Creatinine Ratio: 22 — ABNORMAL HIGH (ref 9–20)
BUN: 30 mg/dL — ABNORMAL HIGH (ref 6–24)
CO2: 21 mmol/L (ref 20–29)
Calcium: 9.8 mg/dL (ref 8.7–10.2)
Chloride: 102 mmol/L (ref 96–106)
Creatinine, Ser: 1.34 mg/dL — ABNORMAL HIGH (ref 0.76–1.27)
GFR calc Af Amer: 72 mL/min/{1.73_m2} (ref >59)
GFR calc non Af Amer: 63 mL/min/{1.73_m2} (ref >59)
Glucose: 237 mg/dL — ABNORMAL HIGH (ref 65–99)
Potassium: 5.5 mmol/L — ABNORMAL HIGH (ref 3.5–5.2)
Sodium: 140 mmol/L (ref 134–144)

## 2020-11-21 LAB — BRAIN NATRIURETIC PEPTIDE: BNP: 77.9 pg/mL (ref 0.0–100.0)

## 2020-11-24 ENCOUNTER — Telehealth: Payer: Self-pay | Admitting: Cardiovascular Disease

## 2020-11-24 DIAGNOSIS — I10 Essential (primary) hypertension: Secondary | ICD-10-CM

## 2020-11-24 DIAGNOSIS — Z5181 Encounter for therapeutic drug level monitoring: Secondary | ICD-10-CM

## 2020-11-24 NOTE — Telephone Encounter (Signed)
° °  Pt is returning call to get lab result °

## 2020-11-24 NOTE — Telephone Encounter (Signed)
Left a message for the patient to call back.  

## 2020-11-26 NOTE — Telephone Encounter (Signed)
Pt called in returning call about his recent labs   Best number -[phone]

## 2020-11-26 NOTE — Telephone Encounter (Signed)
Discussed results with patient:    Joseph Si, MD  11/22/2020 4:23 PM EST     Kidney function is stable. Potassium is a little elevated. Hold potassium supplement. Take 20 mEq on Monday and Friday only. Repeat BMP in a month.     Patient states he did not pick up potassium, so has not started taking K.  He is still taking the furosemide 40 mg daily.  Weight is down 6 lbs and swelling decreased some.   Advised would clarify with MD on medications and repeat labs.

## 2020-11-27 NOTE — Telephone Encounter (Signed)
Repeat BMP.  I'm not sure if this potassium is accurate.

## 2020-11-27 NOTE — Telephone Encounter (Signed)
Advised patient, verbalized understanding  

## 2020-12-01 ENCOUNTER — Encounter: Payer: Medicare Other | Admitting: Vascular Surgery

## 2020-12-05 ENCOUNTER — Encounter: Payer: Self-pay | Admitting: *Deleted

## 2020-12-08 ENCOUNTER — Encounter: Payer: Self-pay | Admitting: Physician Assistant

## 2020-12-08 ENCOUNTER — Ambulatory Visit (INDEPENDENT_AMBULATORY_CARE_PROVIDER_SITE_OTHER): Payer: Medicare Other | Admitting: Physician Assistant

## 2020-12-08 VITALS — Ht 77.0 in | Wt 353.0 lb

## 2020-12-08 DIAGNOSIS — M869 Osteomyelitis, unspecified: Secondary | ICD-10-CM

## 2020-12-08 NOTE — Progress Notes (Signed)
Office Visit Note   Patient: Joseph [name]           Date of Birth: 1971/12/11           MRN: [id number] Visit Date: 12/08/2020              Requested by: Marva Panda, NP [address] [place],  [name] [address] PCP: Marva Panda, NP  Chief Complaint  Patient presents with  . Left Foot - Follow-up    08/13/20 left foot 4th ray amputation       HPI: Patient is a pleasant 49 year old gentleman who follows up for his wound status post fourth fifth ray amputation.  He has been slow to heal.  He has been doing daily dressing changes and elevating his foot   Assessment & Plan: Visit Diagnoses: No diagnosis found.  Plan: Continue current dressing changes I think he is significantly improved follow-up in 3 to 4 weeks  Follow-Up Instructions: No follow-ups on file.   Ortho Exam  Patient is alert, oriented, no adenopathy, well-dressed, normal affect, normal respiratory effort. And webspace wound is healing with good epithelialization there is no foul odor no cellulitis no signs of infection Imaging: No results found. No images are attached to the encounter.  Labs: Lab Results  Component Value Date   HGBA1C 9.4 (H) 07/18/2020   HGBA1C 8.2 (H) 11/02/2019   HGBA1C 8.2 (H) 11/01/2019   ESRSEDRATE 41 (H) 09/16/2017   CRP 8.9 (H) 09/16/2017   REPTSTATUS 07/23/2020 FINAL 07/18/2020   REPTSTATUS 07/23/2020 FINAL 07/18/2020   CULT  07/18/2020    NO GROWTH 5 DAYS Performed at [facility], [address] [place], [name] [address]    CULT  07/18/2020    NO GROWTH 5 DAYS Performed at [facility], [address] [place], [name] [address]      Lab Results  Component Value Date   ALBUMIN 2.9 (L) 07/27/2020   ALBUMIN 2.9 (L) 07/26/2020   ALBUMIN 3.0 (L) 07/21/2020    Lab Results  Component Value Date   MG 2.0 07/25/2020   MG 2.1 07/25/2020   MG 2.3 07/24/2020   No results found for: VD25OH  No results found for: PREALBUMIN CBC EXTENDED Latest  Ref Rng & Units 10/11/2020 10/10/2020 08/13/2020  WBC 4.0 - 10.5 K/uL 8.2 - -  RBC 4.22 - 5.81 MIL/uL 3.98(L) - -  HGB 13.0 - 17.0 g/dL 4.3(X) 11.9(L) 8.8(L)  HCT 39.0 - 52.0 % 32.9(L) 35.0(L) 26.0(L)  PLT 150 - 400 K/uL 280 - -  NEUTROABS 1.7 - 7.7 K/uL - - -  LYMPHSABS 0.7 - 4.0 K/uL - - -     Body mass index is 41.86 kg/m.  Orders:  No orders of the defined types were placed in this encounter.  No orders of the defined types were placed in this encounter.    Procedures: No procedures performed  Clinical Data: No additional findings.  ROS:  All other systems negative, except as noted in the HPI. Review of Systems  Objective: Vital Signs: Ht 6\' 5"  (1.956 m)   Wt (!) 353 lb (160.1 kg)   BMI 41.86 kg/m   Specialty Comments:  No specialty comments available.  PMFS History: Patient Active Problem List   Diagnosis Date Noted  . PAD (peripheral artery disease) (HCC) 10/10/2020  . Osteomyelitis of fourth toe of right foot (HCC)   . S/P CABG x 4 07/24/2020  . Non-ST elevation (NSTEMI) myocardial infarction (HCC) 07/22/2020  . Acute  on chronic diastolic heart failure (HCC) 07/22/2020  . Hypotension 07/18/2020  . Left leg swelling 07/18/2020  . Elevated troponin 07/18/2020  . AKI (acute kidney injury) (HCC) 07/18/2020  . Elevated brain natriuretic peptide (BNP) level 07/18/2020  . GERD (gastroesophageal reflux disease) 07/18/2020  . Severe Sepsis secondary to Lt leg/Foot Cellulitis 07/18/2020  . Severe sepsis --Severe Sepsis secondary to Lt leg/Foot Cellulitis with hypotension and AKI 07/18/2020  . Unstable angina (HCC) 11/01/2019  . Edema 06/13/2019  . Leukocytosis 05/31/2019  . Smoker 03/08/2019  . Chronic pain 03/08/2019  . Chronic back pain 02/16/2019  . Deep venous thrombosis (HCC) 02/16/2019  . Obesity, Class III, BMI 40-49.9 (morbid obesity) (HCC) 02/16/2019  . Peripheral nerve disease 02/16/2019  . COPD (chronic obstructive pulmonary disease) (HCC)  01/22/2019  . Anxiety disorder 03/26/2018  . Open wound of toe 10/12/2017  . Sinusitis 10/12/2017  . Abdominal pain   . Loss of weight   . Diabetic foot infection (HCC) 09/16/2017  . Uncontrolled type 2 diabetes mellitus with hyperglycemia, with long-term current use of insulin (HCC) 09/16/2017  . Cellulitis of right foot   . Chronic ulcer of great toe of right foot (HCC) 09/09/2017  . Recurrent chest pain 07/29/2016  . Hyperglycemia   . Insulin dependent type 2 diabetes mellitus (HCC) 04/29/2015  . OSA (obstructive sleep apnea) 05/08/2013  . History of pulmonary embolus (PE) 04/27/2013  . CAD -S/P PCI 11/02/19  09/10/2008  . Dyslipidemia 09/09/2008   Past Medical History:  Diagnosis Date  . Anxiety   . Arthritis    "knees, shoulders, hips, ankles" (07/29/2016)  . Asthma   . CAD (coronary artery disease)    a. 2017: s/p BMS to distal Cx; b. LHC 05/30/2019: 80% mid, distal RCA s/p DES, 30% narrowing of d LM, widely patent LAD w/ luminal irregularities, widely patent stent in dCX  w/ 90+% stenosis distal to stent beofre small trifurcating obtuse marginal (potentially area of restenosis)  . Chronic bronchitis (HCC)   . Chronic ulcer of right great toe (HCC) 09/19/2017  . COPD (chronic obstructive pulmonary disease) (HCC)   . Depression   . DVT (deep venous thrombosis) (HCC)   . GERD (gastroesophageal reflux disease)   . Hyperlipidemia   . Hypertension   . Myocardial infarction (HCC) 1996   "light one"  . PE (pulmonary embolism) 04/2013   On chronic Xarelto  . Peripheral nerve disease   . Pneumonia "several times"  . Sleep apnea   . Type II diabetes mellitus (HCC)     Family History  Problem Relation Age of Onset  . Hypertension Brother   . Hypertension Father   . Diabetes Other   . Hyperlipidemia Other     Past Surgical History:  Procedure Laterality Date  . ABDOMINAL AORTOGRAM W/LOWER EXTREMITY N/A 10/10/2020   Procedure: ABDOMINAL AORTOGRAM W/LOWER EXTREMITY;   Surgeon: Sherren Kerns, MD;  Location: [facility] INVASIVE CV LAB;  Service: Cardiovascular;  Laterality: N/A;  . AMPUTATION Right 09/21/2017   Procedure: RIGHT GREAT TOE AMPUTATION, POSSIBLE VAC;  Surgeon: Tarry Kos, MD;  Location: [facility] OR;  Service: Orthopedics;  Laterality: Right;  . AMPUTATION Left 08/13/2020   Procedure: LEFT FOOT 4TH RAY AMPUTATION;  Surgeon: [name], MD;  Location: [facility] OR;  Service: Orthopedics;  Laterality: Left;  . CARDIAC CATHETERIZATION  2006  . CARDIAC CATHETERIZATION  1996   "@ [facility]; when I had my heart attack"  . CARDIAC CATHETERIZATION N/A 07/29/2016   Procedure: Left Heart  Cath and Coronary Angiography;  Surgeon: Runell Gess, MD;  Location: [facility] INVASIVE CV LAB;  Service: Cardiovascular;  Laterality: N/A;  . CARDIAC CATHETERIZATION N/A 07/29/2016   Procedure: Coronary Stent Intervention;  Surgeon: Runell Gess, MD;  Location: [facility] INVASIVE CV LAB;  Service: Cardiovascular;  Laterality: N/A;  . CARPAL TUNNEL RELEASE Bilateral   . CORONARY ANGIOPLASTY WITH STENT PLACEMENT  07/29/2016  . CORONARY ARTERY BYPASS GRAFT N/A 07/24/2020   Procedure: CORONARY ARTERY BYPASS GRAFTING (CABG), ON PUMP, TIMES FOUR, USING LEFT INTERNAL MAMMARY ARTERY AND ENDOSCOPICALLY HARVESTED RIGHT GREATER SAPHENOUS VEIN;  Surgeon: Corliss Skains, MD;  Location: [facility] OR;  Service: Open Heart Surgery;  Laterality: N/A;  FLOW TAC  . CORONARY STENT INTERVENTION N/A 05/30/2019   Procedure: CORONARY STENT INTERVENTION;  Surgeon: Lyn Records, MD;  Location: [facility] INVASIVE CV LAB;  Service: Cardiovascular;  Laterality: N/A;  . CORONARY STENT INTERVENTION N/A 11/02/2019   Procedure: CORONARY STENT INTERVENTION;  Surgeon: Iran Ouch, MD;  Location: [facility] INV[facility] CV LAB;  Service: Cardiovascular;  Laterality: N/A;  . ESOPHAGOGASTRODUODENOSCOPY N/A 09/22/2017   Procedure: ESOPHAGOGASTRODUODENOSCOPY (EGD);  Surgeon: Meryl Dare, MD;  Location: [facility] ENDOSCOPY;  Service: Endoscopy;   Laterality: N/A;  . INTRAVASCULAR PRESSURE WIRE/FFR STUDY N/A 07/22/2020   Procedure: INTRAVASCULAR PRESSURE WIRE/FFR STUDY;  Surgeon: Marykay Lex, MD;  Location: [facility] INVASIVE CV LAB;  Service: Cardiovascular;  Laterality: N/A;  . KNEE ARTHROSCOPY Bilateral    "2 on left; 1 on the right"  . LEFT HEART CATH AND CORONARY ANGIOGRAPHY N/A 11/02/2019   Procedure: LEFT HEART CATH AND CORONARY ANGIOGRAPHY;  Surgeon: Iran Ouch, MD;  Location: [facility] LAB;  Service: Cardiovascular;  Laterality: N/A;  . LEFT HEART CATH AND CORONARY ANGIOGRAPHY N/A 07/22/2020   Procedure: LEFT HEART CATH AND CORONARY ANGIOGRAPHY;  Surgeon: Marykay Lex, MD;  Location: [facility] INVASIVE CV LAB;  Service: Cardiovascular;  Laterality: N/A;  . PERIPHERAL VASCULAR INTERVENTION Left 10/11/2020   popliteal and SFA stent placement   . PERIPHERAL VASCULAR INTERVENTION Left 10/10/2020   Procedure: PERIPHERAL VASCULAR INTERVENTION;  Surgeon: Sherren Kerns, MD;  Location: [facility] INVASIVE CV LAB;  Service: Cardiovascular;  Laterality: Left;  . RIGHT/LEFT HEART CATH AND CORONARY ANGIOGRAPHY N/A 05/30/2019   Procedure: RIGHT/LEFT HEART CATH AND CORONARY ANGIOGRAPHY;  Surgeon: Lyn Records, MD;  Location: [facility] CV LAB;  Service: Cardiovascular;  Laterality: N/A;  . SHOULDER OPEN ROTATOR CUFF REPAIR Bilateral   . TEE WITHOUT CARDIOVERSION N/A 07/24/2020   Procedure: TRANSESOPHAGEAL ECHOCARDIOGRAM (TEE);  Surgeon: Corliss Skains, MD;  Location: [facility] OR;  Service: Open Heart Surgery;  Laterality: N/A;   Social History   Occupational History  . Occupation: disabled     Associate Professor: UNEMPLOYED  Tobacco Use  . Smoking status: Former Smoker    Packs/day: 1.00    Years: 34.00    Pack years: 34.00    Types: Cigarettes    Quit date: 07/12/2020    Years since quitting: 0.4  . Smokeless tobacco: Former Neurosurgeon    Types: Snuff, Chew  Vaping Use  . Vaping Use: Never used  Substance and Sexual Activity  . Alcohol use: Yes     Comment: RARE  . Drug use: No  . Sexual activity: Yes

## 2020-12-10 ENCOUNTER — Encounter: Payer: Self-pay | Admitting: Cardiovascular Disease

## 2020-12-22 ENCOUNTER — Other Ambulatory Visit: Payer: Self-pay | Admitting: Physician Assistant

## 2020-12-25 ENCOUNTER — Other Ambulatory Visit: Payer: Medicare Other

## 2020-12-26 ENCOUNTER — Other Ambulatory Visit (HOSPITAL_COMMUNITY): Payer: Self-pay | Admitting: Vascular Surgery

## 2020-12-26 DIAGNOSIS — I739 Peripheral vascular disease, unspecified: Secondary | ICD-10-CM

## 2020-12-29 ENCOUNTER — Ambulatory Visit (INDEPENDENT_AMBULATORY_CARE_PROVIDER_SITE_OTHER): Payer: Medicare Other

## 2020-12-29 ENCOUNTER — Other Ambulatory Visit: Payer: Self-pay

## 2020-12-29 ENCOUNTER — Encounter: Payer: Self-pay | Admitting: Vascular Surgery

## 2020-12-29 ENCOUNTER — Ambulatory Visit (INDEPENDENT_AMBULATORY_CARE_PROVIDER_SITE_OTHER): Payer: Medicare Other | Admitting: Vascular Surgery

## 2020-12-29 VITALS — BP 150/91 | HR 96 | Temp 99.5°F | Resp 18 | Ht 77.0 in | Wt 373.0 lb

## 2020-12-29 DIAGNOSIS — I739 Peripheral vascular disease, unspecified: Secondary | ICD-10-CM

## 2020-12-29 NOTE — Progress Notes (Signed)
[facility] [place]  Patient name: Joseph [name] MRN: [id number] DOB: 1971/05/12 Sex: male  REASON FOR VISIT: Follow-up left SFA and popliteal angioplasty and stent  HPI: Joseph [name] is a 50 y.o. male here today for follow-up.  Had presented with poorly healing left toe amputation.  Underwent arteriography with Dr. Oneida Alar on 10/10/2020.  He underwent angioplasty and stenting of his superficial femoral artery and popliteal artery.  He is here for follow-up.  I have office visit from Dr. Jess Barters physician assistant from 12/08/2020 reporting some improvement in his amputation wound.  He also has marked lower extremity edema and is being treated by his cardiologist.  Past Medical History:  Diagnosis Date  . Anxiety   . Arthritis    "knees, shoulders, hips, ankles" (07/29/2016)  . Asthma   . CAD (coronary artery disease)    a. 2017: s/p BMS to distal Cx; b. LHC 05/30/2019: 80% mid, distal RCA s/p DES, 30% narrowing of d LM, widely patent LAD w/ luminal irregularities, widely patent stent in [place]  w/ 90+% stenosis distal to stent beofre small trifurcating obtuse marginal (potentially area of restenosis)  . Chronic bronchitis ([place])   . Chronic ulcer of right great toe ([place]) 09/19/2017  . COPD (chronic obstructive pulmonary disease) ([place])   . Depression   . DVT (deep venous thrombosis) ([place])   . GERD (gastroesophageal reflux disease)   . Hyperlipidemia   . Hypertension   . Myocardial infarction ([place]) 1996   "light one"  . PE (pulmonary embolism) 04/2013   On chronic Xarelto  . Peripheral nerve disease   . Pneumonia "several times"  . Sleep apnea   . Type II diabetes mellitus (HCC)     Family History  Problem Relation Age of Onset  . Hypertension Brother   . Hypertension Father   . Diabetes Other   . Hyperlipidemia Other     SOCIAL HISTORY: Social History   Tobacco Use  . Smoking status: Former Smoker     Packs/day: 1.00    Years: 34.00    Pack years: 34.00    Types: Cigarettes    Quit date: 07/12/2020    Years since quitting: 0.4  . Smokeless tobacco: Former Systems developer    Types: Snuff, Chew  Substance Use Topics  . Alcohol use: Yes    Comment: RARE    Allergies  Allergen Reactions  . Sulfa Antibiotics Other (See Comments)    headache    Current Outpatient Medications  Medication Sig Dispense Refill  . albuterol (VENTOLIN HFA) 108 (90 Base) MCG/ACT inhaler Inhale 2 puffs into the lungs every 6 (six) hours as needed for wheezing or shortness of breath.    . ALPRAZolam (XANAX) 1 MG tablet Take 1 mg by mouth See admin instructions. Take 1 mg as needed daily anxiety  and 1 mg at bedtime for sleep    . aspirin EC 81 MG tablet Take 81 mg by mouth daily. Swallow whole.    Marland Kitchen atorvastatin (LIPITOR) 80 MG tablet Take 1 tablet (80 mg total) by mouth daily at 6 PM. 30 tablet 2  . blood glucose meter kit and supplies KIT Dispense based on patient and insurance preference. Use up to four times daily as directed. (FOR ICD-9 250.00, 250.01). 1 each 0  . buPROPion (WELLBUTRIN SR) 150 MG 12 hr tablet Take 150 mg by mouth 2 (two) times daily.   0  . carvedilol (COREG) 3.125 MG tablet TAKE 1 TABLET(3.125 MG)  BY MOUTH TWICE DAILY (Patient taking differently: Take 3.125 mg by mouth 2 (two) times daily with a meal.) 180 tablet 1  . clopidogrel (PLAVIX) 75 MG tablet Take 1 tablet (75 mg total) by mouth daily with breakfast. 30 tablet 3  . doxycycline (VIBRA-TABS) 100 MG tablet Take 1 tablet (100 mg total) by mouth 2 (two) times daily. (Patient taking differently: Take 100 mg by mouth 2 (two) times daily. continuous) 60 tablet 0  . escitalopram (LEXAPRO) 10 MG tablet Take 10 mg by mouth daily.    . fenofibrate (TRICOR) 145 MG tablet Take 145 mg by mouth daily.    . fluconazole (DIFLUCAN) 100 MG tablet Take 100 mg by mouth every Monday.    . fluticasone (FLONASE) 50 MCG/ACT nasal spray Place 2 sprays into both  nostrils daily as needed for allergies.    . furosemide (LASIX) 40 MG tablet Take 1 tablet (40 mg total) by mouth daily. 90 tablet 1  . HUMALOG KWIKPEN 100 UNIT/ML SOPN Inject 15-40 Units into the skin 3 (three) times daily with meals. sliding scale    . hydrochlorothiazide (MICROZIDE) 12.5 MG capsule TAKE 1 CAPSULE(12.5 MG) BY MOUTH DAILY 90 capsule 3  . LANTUS SOLOSTAR 100 UNIT/ML Solostar Pen Inject 40 Units into the skin 2 (two) times daily. 15 mL 11  . meclizine (ANTIVERT) 25 MG tablet Take 25-50 mg by mouth See admin instructions. Take 50 mg by mouth in the morning & take  25 mg by mouth at night.    . metFORMIN (GLUCOPHAGE) 1000 MG tablet Take 1 tablet (1,000 mg total) by mouth 2 (two) times daily with a meal.    . oxyCODONE (ROXICODONE) 15 MG immediate release tablet Take 15 mg by mouth 5 (five) times daily.     . pantoprazole (PROTONIX) 40 MG tablet Take 1 tablet (40 mg total) by mouth daily. (Patient taking differently: Take 80 mg by mouth daily.) 30 tablet 0  . potassium chloride SA (KLOR-CON) 20 MEQ tablet TAKE 2 BY MOUTH DAILY 60 tablet 5  . pregabalin (LYRICA) 150 MG capsule Take 150 mg by mouth 3 (three) times daily.     Marland Kitchen VICTOZA 18 MG/3ML SOPN Inject 1.2 mg into the skin at bedtime.     No current facility-administered medications for this visit.    REVIEW OF SYSTEMS:  [X]  denotes positive finding, [ ]  denotes negative finding Cardiac  Comments:  Chest pain or chest pressure: x   Shortness of breath upon exertion: x   Short of breath when lying flat:    Irregular heart rhythm:        Vascular    Pain in calf, thigh, or hip brought on by ambulation: x   Pain in feet at night that wakes you up from your sleep:     Blood clot in your veins:    Leg swelling:           PHYSICAL EXAM: Vitals:   12/29/20 1607  BP: (!) 150/91  Pulse: 96  Resp: 18  Temp: 99.5 F (37.5 C)  TempSrc: Other (Comment)  SpO2: 97%  Weight: (!) 373 lb (169.2 kg)  Height: 6' 5"  (1.956 m)     GENERAL: The patient is a well-nourished male, in no acute distress. The vital signs are documented above. CARDIOVASCULAR: I do not palpate pedal pulses bilaterally. PULMONARY: There is good air exchange  MUSCULOSKELETAL: There are no major deformities or cyanosis. NEUROLOGIC: No focal weakness or paresthesias are detected. PSYCHIATRIC: The patient  has a normal affect.  DATA:  Noninvasive studies were reviewed with the patient.  This reveals an ankle arm index of 0.81 on the right and 0.84 on the left  Duplex of his stents reveal wide patency of his superficial femoral and popliteal stents.  No elevated velocities  MEDICAL ISSUES: Stable from a vascular standpoint.  Does have multiple issues causing difficulty with wound healing.  Does have adequate arterial flow.  We will continue his follow-up with Dr. Sharol Given.  Will see [place] in 6 months with repeat noninvasive studies    Rosetta Posner, MD [facility] Tel [phone]

## 2020-12-29 NOTE — Progress Notes (Deleted)
Cardiology Clinic Note   Patient Name: Joseph [name] Date of Encounter: 12/29/2020  Primary Care Provider:  [name], NP Primary Cardiologist:  Skeet Latch, MD  Patient Profile    Johnella Moloney Chrisco4 year old male presents today for follow-up of his coronary artery disease, obstructive sleep apnea, and chest pain.  Past Medical History    Past Medical History:  Diagnosis Date  . Anxiety   . Arthritis    "knees, shoulders, hips, ankles" (07/29/2016)  . Asthma   . CAD (coronary artery disease)    a. 2017: s/p BMS to distal Cx; b. LHC 05/30/2019: 80% mid, distal RCA s/p DES, 30% narrowing of d LM, widely patent LAD w/ luminal irregularities, widely patent stent in [place]  w/ 90+% stenosis distal to stent beofre small trifurcating obtuse marginal (potentially area of restenosis)  . Chronic bronchitis ([place])   . Chronic ulcer of right great toe ([place]) 09/19/2017  . COPD (chronic obstructive pulmonary disease) ([place])   . Depression   . DVT (deep venous thrombosis) ([place])   . GERD (gastroesophageal reflux disease)   . Hyperlipidemia   . Hypertension   . Myocardial infarction ([place]) 1996   "light one"  . PE (pulmonary embolism) 04/2013   On chronic Xarelto  . Peripheral nerve disease   . Pneumonia "several times"  . Sleep apnea   . Type II diabetes mellitus ([place])    Past Surgical History:  Procedure Laterality Date  . ABDOMINAL AORTOGRAM W/LOWER EXTREMITY N/A 10/10/2020   Procedure: ABDOMINAL AORTOGRAM W/LOWER EXTREMITY;  Surgeon: Elam Dutch, MD;  Location: [facility];  Service: Cardiovascular;  Laterality: N/A;  . AMPUTATION Right 09/21/2017   Procedure: RIGHT GREAT TOE AMPUTATION, POSSIBLE VAC;  Surgeon: Leandrew Koyanagi, MD;  Location: [facility];  Service: Orthopedics;  Laterality: Right;  . AMPUTATION Left 08/13/2020   Procedure: LEFT FOOT 4TH RAY AMPUTATION;  Surgeon: Newt Minion, MD;  Location: [facility];  Service: Orthopedics;  Laterality: Left;  . CARDIAC  CATHETERIZATION  2006  . CARDIAC CATHETERIZATION  1996   "@ [facility]; when I had my heart attack"  . CARDIAC CATHETERIZATION N/A 07/29/2016   Procedure: Left Heart Cath and Coronary Angiography;  Surgeon: Lorretta Harp, MD;  Location: [facility];  Service: Cardiovascular;  Laterality: N/A;  . CARDIAC CATHETERIZATION N/A 07/29/2016   Procedure: Coronary Stent Intervention;  Surgeon: Lorretta Harp, MD;  Location: [facility];  Service: Cardiovascular;  Laterality: N/A;  . CARPAL TUNNEL RELEASE Bilateral   . CORONARY ANGIOPLASTY WITH STENT PLACEMENT  07/29/2016  . CORONARY ARTERY BYPASS GRAFT N/A 07/24/2020   Procedure: CORONARY ARTERY BYPASS GRAFTING (CABG), ON PUMP, TIMES FOUR, USING LEFT INTERNAL MAMMARY ARTERY AND ENDOSCOPICALLY HARVESTED RIGHT GREATER SAPHENOUS VEIN;  Surgeon: Lajuana Matte, MD;  Location: [facility];  Service: Open Heart Surgery;  Laterality: N/A;  FLOW TAC  . CORONARY STENT INTERVENTION N/A 05/30/2019   Procedure: CORONARY STENT INTERVENTION;  Surgeon: Belva Crome, MD;  Location: [facility];  Service: Cardiovascular;  Laterality: N/A;  . CORONARY STENT INTERVENTION N/A 11/02/2019   Procedure: CORONARY STENT INTERVENTION;  Surgeon: Wellington Hampshire, MD;  Location: [facility];  Service: Cardiovascular;  Laterality: N/A;  . ESOPHAGOGASTRODUODENOSCOPY N/A 09/22/2017   Procedure: ESOPHAGOGASTRODUODENOSCOPY (EGD);  Surgeon: Ladene Artist, MD;  Location: [facility];  Service: Endoscopy;  Laterality: N/A;  . INTRAVASCULAR PRESSURE WIRE/FFR STUDY N/A 07/22/2020   Procedure: INTRAVASCULAR PRESSURE WIRE/FFR STUDY;  Surgeon: Leonie Man, MD;  Location: [facility];  Service: Cardiovascular;  Laterality: N/A;  . KNEE ARTHROSCOPY Bilateral    "2 on left; 1 on the right"  . LEFT HEART CATH AND CORONARY ANGIOGRAPHY N/A 11/02/2019   Procedure: LEFT HEART CATH AND CORONARY ANGIOGRAPHY;  Surgeon: Wellington Hampshire, MD;  Location: [facility];  Service: Cardiovascular;  Laterality: N/A;  . LEFT HEART CATH AND CORONARY ANGIOGRAPHY N/A 07/22/2020   Procedure: LEFT HEART CATH AND CORONARY ANGIOGRAPHY;  Surgeon: Leonie Man, MD;  Location: [facility];  Service: Cardiovascular;  Laterality: N/A;  . PERIPHERAL VASCULAR INTERVENTION Left 10/11/2020   popliteal and SFA stent placement   . PERIPHERAL VASCULAR INTERVENTION Left 10/10/2020   Procedure: PERIPHERAL VASCULAR INTERVENTION;  Surgeon: Elam Dutch, MD;  Location: [facility];  Service: Cardiovascular;  Laterality: Left;  . RIGHT/LEFT HEART CATH AND CORONARY ANGIOGRAPHY N/A 05/30/2019   Procedure: RIGHT/LEFT HEART CATH AND CORONARY ANGIOGRAPHY;  Surgeon: Belva Crome, MD;  Location: [facility];  Service: Cardiovascular;  Laterality: N/A;  . SHOULDER OPEN ROTATOR CUFF REPAIR Bilateral   . TEE WITHOUT CARDIOVERSION N/A 07/24/2020   Procedure: TRANSESOPHAGEAL ECHOCARDIOGRAM (TEE);  Surgeon: Lajuana Matte, MD;  Location: [facility];  Service: Open Heart Surgery;  Laterality: N/A;    Allergies  Allergies  Allergen Reactions  . Sulfa Antibiotics Other (See Comments)    headache    History of Present Illness    Mr. Ahles a history of normal coronary arteries in 2006. He has multiple comorbidities including insulin-dependent diabetes, hypertension, hyperlipidemia, sleep apnea, chronic pain, prior pulmonary embolism in 2014on chronic Xarelto, smoking and obesity. At one time he weighed over 600 pounds. In 2017 he was evaluated for chest pain. He underwent cardiac catheterization which showed an 80% lesion in his distal circumflex. The lesion was treated with a bare-metal stent. He had no other significant CAD at that time. An echocardiogram in 07/2016 showed an LVEF of 55 to 60% with mild LVH.  In March 2020 he presented to the clinic with complaints of chest pain and lower extremity edema. Low-dose carvedilol was added to his medication  regimen and he was not noted to have any lower extremity edema on exam. An echocardiogram and Myoview were ordered but were delayed due to the COVID-19 pandemic. He was given low-dose nitrate but could not tolerate the therapy due to headache. A Myoview was completed on 05/23/2019 and was abnormal with new LVEF of 40%. He underwent a diagnostic cardiac catheterization on 05/30/2019. His catheterization showed his previous circumflex stent was patent however, he had distal 90% stenosis and medical management was recommended. He also had a 80% mid RCA lesion that was treated with PCI/DES.  He was  seen by Kerin Ransom, PA-C via virtual platform on 06/13/2019. He had no complaints of chest pain but still indicated he had lower extremity edema. HCTZ 2.5 mg 3 times per week was prescribed.  He presented the clinic 11/01/2019 and statedhe had noticed intermittent chest pain for the last 3 weeks. He stated that on Monday and Tuesday he had chest pain both at rest and with activity. He stated that this chest pain was relieved by nitroglycerin. He stated he had to take 3 nitroglycerin for his chest pain to stop and that itlasted around 20 minutes with each episode. He described the pain as heavy pressure on his chest that radiated down his left arm. He stated  that the chest pain he had with these 2 most recent episodes wasvery similar to the chest pain he had in June with his stent placement but had beenworse. When asked about when he last had chest pain he stated"this morning at 2 AM". He did not take nitroglycerin this morning and states he went back to sleep. He is currently not having chest pain. His weight has increased about 50 pounds since he was last seen in June. He stated he did not weigh himself daily or monitor his blood pressure regularly. He was eating convenience foods such as fast food regularly. He stated he was compliant with his medications.I had a long conversation with both  the patient and his wife about having a strict low-sodium diet,medication compliance,physical activity,and his comorbidities. I  discussed the case with the DOD inclinic and sent him to the [facility] ED to be evaluated for ACS. I  alerted the cardiology team that he was on his way to the hospital.  He underwent cardiac catheterization 11/02/2019 which showed a culprit lesion to be his distal circumflex which was 90% occluded he received DES x1.  His mid LAD was 35% stenosed, first diagonal 50% stenosed, ostial circumflex 45% proximal RCA 25%, and right posterior descending artery 30% stenosed.  His EF was normal.  His echocardiogram showed an EF of 55-60%, mild LVH, G2 DD, and mild dilation of the left atria.  He had repeat cardiac catheterization 07/22/2020 which showed severe distal left main 70% stenosis, 55% stenosed side branch in the circumflex, 60% mid circumflex first lesion, 50% stenosed side branch and first marginal.  Mid circumflex overlapping bare-metal stent and DES 5% stenosed, first diagonal 60% stenosed, third diagonal 60% stenosed.  Proximal RCA to mid RCA stented segment 25% stenosed.  And distal RCA lesion 50% stenosed with 65% stenosis of the side branch and RPDA.  He was referred to CVTS for consultation.  He underwent CABG x4 (LIMA-LAD, SVG-D1, SVG-OM, SVG-PDA 07/24/2020).  Wound care was consulted to evaluate his left fourth toe.  He was discharged 07/31/2020 in stable condition.  He developed osteomyelitis in his left fourth toe and received left foot fourth ray amputation 08/13/2020.  He was placed on wound VAC, received perioperative antibiotics.  On follow-up 09/05/2020 he was noted to have some maceration, drainage and slight odor to his operative area.  He was placed on doxycycline.  He was seen in follow-up on 09/16/2020.  Skin maceration was again noted.  However it was found to be along the bottom of his foot.  He was instructed to use compression stockings and keep the area dry.  He  was continued on antibiotics.  He was seen in follow-up by Dr. Kipp Brood on 09/12/2020.  Chest x-ray was clear at that time.  Chest incision is clean, sternum stable, abdomen soft and nontender, noted to have +1 peripheral edema.  He was cleared for cardiac rehab at that time and instructed to follow-up as needed.  He presented to the clinic 09/26/2020 for follow-up evaluation stated he did not remember seeing me back in November prior to his cardiac catheterization.  He stated that he was limited in increasing his physical activity due to his wound on his left foot.  He had cut a large amount of salt out of his diet and was  using seasoning such as Mrs. Dash on his food.  He stated he was working with orthopedics so that he will get an infection in his left foot.  His chest incision  was clean dry intact.  He stated he did notice some muscular pain in both his right and left chest.  He had noticed his blood pressure was somewhat elevated at home at times.  However, he was still taking his midodrine.  I gave him salty 6 diet sheet, had him increase his physical activity within the parameters of his restrictions, gave him a blood pressure log, and stopped his midodrine at that time.  Planned follow-up in 3 months.  He was seen by Dr. Oval Linsey 11/20/2020.  During that time he complained of constant chest pain.  He continued to have lower extremity edema.  He reported compliance with his Lasix and he was down 5-6 pounds.  His blood pressure was 130/83.  It was recommended that he have a repeat stress test however, he has not yet completed due to family illness.  This is still not been completed.  Did not tolerate long-acting nitroglycerin due to headaches.  He was instructed to present to the emergency department if his chest discomfort persisted after taking 3 sublingual nitroglycerin.  He presents the clinic today for follow-up evaluation states***  Hedenies fatigue, palpitations, melena, hematuria,  hemoptysis, diaphoresis, weakness, presyncope, andsyncope.  Home Medications    Prior to Admission medications   Medication Sig Start Date End Date Taking? Authorizing Provider  albuterol (VENTOLIN HFA) 108 (90 Base) MCG/ACT inhaler Inhale 2 puffs into the lungs every 6 (six) hours as needed for wheezing or shortness of breath.    [provider]  ALPRAZolam Duanne Moron) 1 MG tablet Take 1 mg by mouth See admin instructions. Take 1 mg as needed daily anxiety  and 1 mg at bedtime for sleep    [provider]  aspirin EC 81 MG tablet Take 81 mg by mouth daily. Swallow whole.    [provider]  atorvastatin (LIPITOR) 80 MG tablet Take 1 tablet (80 mg total) by mouth daily at 6 PM. 05/31/19   Sande Rives E, PA-C  blood glucose meter kit and supplies KIT Dispense based on patient and insurance preference. Use up to four times daily as directed. (FOR ICD-9 250.00, 250.01). 09/23/17   Hongalgi, Lenis Dickinson, MD  buPROPion (WELLBUTRIN SR) 150 MG 12 hr tablet Take 150 mg by mouth 2 (two) times daily.  08/17/16   [provider]  carvedilol (COREG) 3.125 MG tablet TAKE 1 TABLET(3.125 MG) BY MOUTH TWICE DAILY Patient taking differently: Take 3.125 mg by mouth 2 (two) times daily with a meal. 09/01/20   Elgie Collard, PA-C  clopidogrel (PLAVIX) 75 MG tablet Take 1 tablet (75 mg total) by mouth daily with breakfast. 10/11/20   Ulyses Amor, PA-C  doxycycline (VIBRA-TABS) 100 MG tablet Take 1 tablet (100 mg total) by mouth 2 (two) times daily. Patient taking differently: Take 100 mg by mouth 2 (two) times daily. continuous 10/02/20   Newt Minion, MD  escitalopram (LEXAPRO) 10 MG tablet Take 10 mg by mouth daily. 06/10/20   [provider]  fenofibrate (TRICOR) 145 MG tablet Take 145 mg by mouth daily.    [provider]  fluconazole (DIFLUCAN) 100 MG tablet Take 100 mg by mouth every Monday.    [provider]  fluticasone (FLONASE) 50 MCG/ACT nasal  spray Place 2 sprays into both nostrils daily as needed for allergies. 03/14/19   [provider]  furosemide (LASIX) 40 MG tablet Take 1 tablet (40 mg total) by mouth daily. 11/12/20 02/10/21  Skeet Latch, MD  [place]  100 UNIT/ML SOPN Inject 15-40 Units into the skin 3 (three) times daily with meals. sliding scale 05/02/13   [provider]  hydrochlorothiazide (MICROZIDE) 12.5 MG capsule TAKE 1 CAPSULE(12.5 MG) BY MOUTH DAILY 12/11/20   Isaiah Serge, NP  LANTUS SOLOSTAR 100 UNIT/ML Solostar Pen Inject 40 Units into the skin 2 (two) times daily. 07/31/20   Elgie Collard, PA-C  meclizine (ANTIVERT) 25 MG tablet Take 25-50 mg by mouth See admin instructions. Take 50 mg by mouth in the morning & take  25 mg by mouth at night. 04/10/13   [provider]  metFORMIN (GLUCOPHAGE) 1000 MG tablet Take 1 tablet (1,000 mg total) by mouth 2 (two) times daily with a meal. 11/05/19   Isaiah Serge, NP  oxyCODONE (ROXICODONE) 15 MG immediate release tablet Take 15 mg by mouth 5 (five) times daily.     [provider]  pantoprazole (PROTONIX) 40 MG tablet Take 1 tablet (40 mg total) by mouth daily. Patient taking differently: Take 80 mg by mouth daily. 09/23/17   Hongalgi, Lenis Dickinson, MD  potassium chloride SA (KLOR-CON) 20 MEQ tablet TAKE 2 BY MOUTH DAILY 11/20/20   Skeet Latch, MD  pregabalin (LYRICA) 150 MG capsule Take 150 mg by mouth 3 (three) times daily.  05/06/19   [provider]  VICTOZA 18 MG/3ML SOPN Inject 1.2 mg into the skin at bedtime. 04/30/19   [provider]    Family History    Family History  Problem Relation Age of Onset  . Hypertension Brother   . Hypertension Father   . Diabetes Other   . Hyperlipidemia Other    He indicated that his mother is alive. He indicated that his father is alive. He indicated that his brother is alive. He indicated that the status of his other is unknown.  Social History    Social  History   Socioeconomic History  . Marital status: Married    Spouse name: Not on file  . Number of children: Not on file  . Years of education: Not on file  . Highest education level: Not on file  Occupational History  . Occupation: disabled     Fish farm manager: UNEMPLOYED  Tobacco Use  . Smoking status: Former Smoker    Packs/day: 1.00    Years: 34.00    Pack years: 34.00    Types: Cigarettes    Quit date: 07/12/2020    Years since quitting: 0.4  . Smokeless tobacco: Former Systems developer    Types: Snuff, Chew  Vaping Use  . Vaping Use: Never used  Substance and Sexual Activity  . Alcohol use: Yes    Comment: RARE  . Drug use: No  . Sexual activity: Yes  Other Topics Concern  . Not on file  Social History Narrative  . Not on file   Social Determinants of Health   Financial Resource Strain: Not on file  Food Insecurity: Not on file  Transportation Needs: Not on file  Physical Activity: Not on file  Stress: Not on file  Social Connections: Not on file  Intimate Partner Violence: Not on file     Review of Systems    General:  No chills, fever, night sweats or weight changes.  Cardiovascular:  No chest pain, dyspnea on exertion, edema, orthopnea, palpitations, paroxysmal nocturnal dyspnea. Dermatological: No rash, lesions/masses Respiratory: No cough, dyspnea Urologic: No hematuria, dysuria Abdominal:   No nausea, vomiting, diarrhea, bright red blood per rectum, melena, or hematemesis Neurologic:  No visual changes, wkns, changes in mental status. All other systems reviewed and are otherwise negative except as noted above.  Physical Exam    VS:  There were no vitals taken for this visit. , BMI There is no height or weight on file to calculate BMI. GEN: Well nourished, well developed, in no acute distress. HEENT: normal. Neck: Supple, no JVD, carotid bruits, or masses. Cardiac: RRR, no murmurs, rubs, or gallops. No clubbing, cyanosis, edema.  Radials/DP/PT 2+ and equal  bilaterally.  Respiratory:  Respirations regular and unlabored, clear to auscultation bilaterally. GI: Soft, nontender, nondistended, BS + x 4. MS: no deformity or atrophy. Skin: warm and dry, no rash. Neuro:  Strength and sensation are intact. Psych: Normal affect.  Accessory Clinical Findings    Recent Labs: 07/25/2020: Magnesium 2.0 07/27/2020: ALT 73 10/11/2020: Hemoglobin 9.5; Platelets 280 11/20/2020: BNP 77.9; BUN 30; Creatinine, Ser 1.34; Potassium 5.5; Sodium 140   Recent Lipid Panel    Component Value Date/Time   CHOL 158 07/19/2020 0459   TRIG 187 (H) 07/19/2020 0459   HDL 20 (L) 07/19/2020 0459   CHOLHDL 7.9 07/19/2020 0459   VLDL 37 07/19/2020 0459   LDLCALC 101 (H) 07/19/2020 0459   LDLDIRECT 120.0 05/28/2015 0951    ECG personally reviewed by me today- *** - No acute changes  Echocardiogram 07/18/2020 IMPRESSIONS    1. Left ventricular ejection fraction, by estimation, is 50 to 55%. The  left ventricle has low normal function. Left ventricular endocardial  border not optimally defined to evaluate regional wall motion. There is  mild left ventricular hypertrophy. Left  ventricular diastolic parameters were normal.  2. Right ventricular systolic function is normal. The right ventricular  size is normal. Tricuspid regurgitation signal is inadequate for assessing  PA pressure.  3. The mitral valve is grossly normal. No evidence of mitral valve  regurgitation.  4. The aortic valve was not well visualized. Aortic valve regurgitation  is not visualized.  5. The inferior vena cava is normal in size with greater than 50%  respiratory variability, suggesting right atrial pressure of 3 mmHg.   Cardiac catheterization 08/24/7095  LV end diastolic pressure is severely elevated -consistent with ACUTE DIASTOLIC HEART FAILURE  Mid LM to Ost LAD lesion is 70% stenosed with 55% stenosed side branch in Ost Cx to Mid Cx.  Mid Cx-1 lesion is 60% stenosed with 50%  stenosed side branch in 1st Mrg.  Mid Cx-2 overlapping BMS and DES stent segment is 5% stenosed.  1st Diag lesion is 60% stenosed. 3rd Diag lesion is 60% stenosed.  Prox RCA to Mid RCA stented segment is 25% stenosed.  Dist RCA lesion is 50% stenosed with 65% stenosed side branch in RPDA.  -------------------------------------------- \SUMMARY  Severe distal LEFT MAIN 70% stenosis (highly positive DFR 0.85 and proximal LAD) ? Relatively normal LAD with minimal disease, 1st & 3rd Diag having proximal 50 to 60% stenosis. ? Diffuse 55% calcified proximal to mid LCx with a focal 60% stenosis just prior to overlapping VISION BMS, and RESOLUTE ONYX DES in the main LCx prior to bifurcation.  Minimal RCA disease with roughly 20% ISR in the stented segment of the RCA. Ostial PDA 50-60%  LV gram not performed due to severely elevated LVEDP of 33 mmHg -> consistent with ACUTE DIASTOLIC HEART FAILURE (will need echocardiogram to assess EF)   RECOMMENDATIONS:  With severely LM and proximal LCx disease, will refer for CVTS consultation. -->Plavix has been discontinued  He would likely need  several days for Plavix washout -> he will also need aggressive blood pressure management along with diuresis. ? I have ordered Lasix 40 mg IV twice daily starting this evening.  Restart IV heparin 8 hours after sheath removal.  Continue aggressive CRF Guideline Directed Therapy.  Diagnostic Dominance: Right  Intervention    Assessment & Plan   1.  Coronary artery disease- continues to have intermittent periods of chest pain, nonexertional in nature. Underwent CABG x4 on 07/24/2020.    Completed cardiac rehab and is now somewhat sedentary.  Recommended that he repeat cardiac stress test during his prior visit 11/20/2020 with Dr. Oval Linsey.  Not yet completed.  Intolerant of long-acting nitro due to headache. Continue atorvastatin, carvedilol, aspirin, Plavix Heart healthy low-sodium diet Increase  physical activity as tolerated Schedule nuclear stress test  Essential hypertension-BP today*** 136/80. Patient does not monitor blood pressure at home. Also states he does not follow a low-sodium diet and eats convenience foods. Continue atorvastatin, carvedilol, HCTZ Heart healthy low-sodium diet Increase physical activity as tolerated Keep blood pressure log  Hyperlipidemia- LDL 101 on 07/19/2020. Continue atorvastatin, fenofibrate Heart healthy low-sodium high-fiber diet Increase physical activity as tolerated Repeat lipid and liver  Peripheral arterial disease-status post multiple stents. Continue aspirin, atorvastatin Heart healthy low-sodium diet-salty 6 given Increase physical activity as tolerated Follows with Dr. Sharol Given  Left fourth toe amputation- healed.  Evaluated by orthopedics and instructed to keep the wound site dry do to maceration.  Continues with walking boot. Follows with orthopedics   Disposition: Follow-up with Dr. Oval Linsey or me in 3 months.   Jossie Ng. Lavere Stork NP-C    12/29/2020, 12:04 PM [address] Office [phone] Fax [phone]  Notice: This dictation was prepared with Dragon dictation along with smaller phrase technology. Any transcriptional errors that result from this process are unintentional and may not be corrected upon review.  I spent***minutes examining this patient, reviewing medications, and using patient centered shared decision making involving her cardiac care.  Prior to her visit I spent greater than 20 minutes reviewing her past medical history,  medications, and prior cardiac tests.

## 2020-12-30 ENCOUNTER — Ambulatory Visit: Payer: Medicare Other | Admitting: General Practice

## 2020-12-31 ENCOUNTER — Ambulatory Visit (INDEPENDENT_AMBULATORY_CARE_PROVIDER_SITE_OTHER): Payer: Medicare Other | Admitting: Physician Assistant

## 2020-12-31 ENCOUNTER — Encounter: Payer: Self-pay | Admitting: Physician Assistant

## 2020-12-31 DIAGNOSIS — M869 Osteomyelitis, unspecified: Secondary | ICD-10-CM

## 2020-12-31 NOTE — Progress Notes (Signed)
Office Visit Note   Patient: Joseph [name]           Date of Birth: 02-Jun-1971           MRN: [id number] Visit Date: 12/31/2020              Requested by: Marva Panda, NP [address] [place],  [name] [address] PCP: Marva Panda, NP  No chief complaint on file.     HPI:  Patient is a pleasant 50 year old gentleman who follows up status post fourth and fifth ray amputations from several months ago.  He has been slow to heal but continues to make slow progress.  He does daily dressing changes.  He recently had a vascular evaluation and was told that he was stable Assessment & Plan: Visit Diagnoses: No diagnosis found.  Plan: Continue dressing changes and all weightbearing.  Follow-up in 3 weeks.  Follow-Up Instructions: No follow-ups on file.   Ortho Exam  Patient is alert, oriented, no adenopathy, well-dressed, normal affect, normal respiratory effort. Examination dorsum of the wound is healed he is also obtaining good closure going into the webspace.  He has 2 small areas of slowly healing desist dehiscence these do not probe deeply there is no surrounding cellulitis just some macerated skin after verbal consent these were debrided to healthy pink tissue.  Reviewed vascular consult from December which demonstrated him to be vascularly stable  Imaging: No results found. No images are attached to the encounter.  Labs: Lab Results  Component Value Date   HGBA1C 9.4 (H) 07/18/2020   HGBA1C 8.2 (H) 11/02/2019   HGBA1C 8.2 (H) 11/01/2019   ESRSEDRATE 41 (H) 09/16/2017   CRP 8.9 (H) 09/16/2017   REPTSTATUS 07/23/2020 FINAL 07/18/2020   REPTSTATUS 07/23/2020 FINAL 07/18/2020   CULT  07/18/2020    NO GROWTH 5 DAYS Performed at [facility], [address] [place], [name] [address]    CULT  07/18/2020    NO GROWTH 5 DAYS Performed at [facility], [address], [place], [name] [address]      Lab Results  Component Value Date   ALBUMIN 2.9 (L)  07/27/2020   ALBUMIN 2.9 (L) 07/26/2020   ALBUMIN 3.0 (L) 07/21/2020    Lab Results  Component Value Date   MG 2.0 07/25/2020   MG 2.1 07/25/2020   MG 2.3 07/24/2020   No results found for: VD25OH  No results found for: PREALBUMIN CBC EXTENDED Latest Ref Rng & Units 10/11/2020 10/10/2020 08/13/2020  WBC 4.0 - 10.5 K/uL 8.2 - -  RBC 4.22 - 5.81 MIL/uL 3.98(L) - -  HGB 13.0 - 17.0 g/dL 9.8(P) 11.9(L) 8.8(L)  HCT 39.0 - 52.0 % 32.9(L) 35.0(L) 26.0(L)  PLT 150 - 400 K/uL 280 - -  NEUTROABS 1.7 - 7.7 K/uL - - -  LYMPHSABS 0.7 - 4.0 K/uL - - -     There is no height or weight on file to calculate BMI.  Orders:  No orders of the defined types were placed in this encounter.  No orders of the defined types were placed in this encounter.    Procedures: No procedures performed  Clinical Data: No additional findings.  ROS:  All other systems negative, except as noted in the HPI. Review of Systems  Objective: Vital Signs: There were no vitals taken for this visit.  Specialty Comments:  No specialty comments available.  PMFS History: Patient Active Problem List   Diagnosis Date Noted  . PAD (peripheral artery  disease) (HCC) 10/10/2020  . Osteomyelitis of fourth toe of right foot (HCC)   . S/P CABG x 4 07/24/2020  . Non-ST elevation (NSTEMI) myocardial infarction (HCC) 07/22/2020  . Acute on chronic diastolic heart failure (HCC) 07/22/2020  . Hypotension 07/18/2020  . Left leg swelling 07/18/2020  . Elevated troponin 07/18/2020  . AKI (acute kidney injury) (HCC) 07/18/2020  . Elevated brain natriuretic peptide (BNP) level 07/18/2020  . GERD (gastroesophageal reflux disease) 07/18/2020  . Severe Sepsis secondary to Lt leg/Foot Cellulitis 07/18/2020  . Severe sepsis --Severe Sepsis secondary to Lt leg/Foot Cellulitis with hypotension and AKI 07/18/2020  . Unstable angina (HCC) 11/01/2019  . Edema 06/13/2019  . Leukocytosis 05/31/2019  . Smoker 03/08/2019  . Chronic  pain 03/08/2019  . Chronic back pain 02/16/2019  . Deep venous thrombosis (HCC) 02/16/2019  . Obesity, Class III, BMI 40-49.9 (morbid obesity) (HCC) 02/16/2019  . Peripheral nerve disease 02/16/2019  . COPD (chronic obstructive pulmonary disease) (HCC) 01/22/2019  . Anxiety disorder 03/26/2018  . Open wound of toe 10/12/2017  . Sinusitis 10/12/2017  . Abdominal pain   . Loss of weight   . Diabetic foot infection (HCC) 09/16/2017  . Uncontrolled type 2 diabetes mellitus with hyperglycemia, with long-term current use of insulin (HCC) 09/16/2017  . Cellulitis of right foot   . Chronic ulcer of great toe of right foot (HCC) 09/09/2017  . Recurrent chest pain 07/29/2016  . Hyperglycemia   . Insulin dependent type 2 diabetes mellitus (HCC) 04/29/2015  . OSA (obstructive sleep apnea) 05/08/2013  . History of pulmonary embolus (PE) 04/27/2013  . CAD -S/P PCI 11/02/19  09/10/2008  . Dyslipidemia 09/09/2008   Past Medical History:  Diagnosis Date  . Anxiety   . Arthritis    "knees, shoulders, hips, ankles" (07/29/2016)  . Asthma   . CAD (coronary artery disease)    a. 2017: s/p BMS to distal Cx; b. LHC 05/30/2019: 80% mid, distal RCA s/p DES, 30% narrowing of d LM, widely patent LAD w/ luminal irregularities, widely patent stent in dCX  w/ 90+% stenosis distal to stent beofre small trifurcating obtuse marginal (potentially area of restenosis)  . Chronic bronchitis (HCC)   . Chronic ulcer of right great toe (HCC) 09/19/2017  . COPD (chronic obstructive pulmonary disease) (HCC)   . Depression   . DVT (deep venous thrombosis) (HCC)   . GERD (gastroesophageal reflux disease)   . Hyperlipidemia   . Hypertension   . Myocardial infarction (HCC) 1996   "light one"  . PE (pulmonary embolism) 04/2013   On chronic Xarelto  . Peripheral nerve disease   . Pneumonia "several times"  . Sleep apnea   . Type II diabetes mellitus (HCC)     Family History  Problem Relation Age of Onset  .  Hypertension Brother   . Hypertension Father   . Diabetes Other   . Hyperlipidemia Other     Past Surgical History:  Procedure Laterality Date  . ABDOMINAL AORTOGRAM W/LOWER EXTREMITY N/A 10/10/2020   Procedure: ABDOMINAL AORTOGRAM W/LOWER EXTREMITY;  Surgeon: Sherren Kerns, MD;  [facility] INVAS[facility] LAB;  Service: Cardiovascular;  Laterality: N/A;  . AMPUTATION Right 09/21/2017   Procedure: RIGHT GREAT TOE AMPUTATION, POSSIBLE VAC;  Surgeon: Tarry Kos, MD;  Location: [facility] OR;  Service: Orthopedics;  Laterality: Right;  . AMPUTATION Left 08/13/2020   Procedure: LEFT FOOT 4TH RAY AMPUTATION;  Surgeon: [name], MD;  Location: [facility] OR;  Service: Orthopedics;  Laterality: Left;  .  CARDIAC CATHETERIZATION  2006  . CARDIAC CATHETERIZATION  1996   "@ [facility]; when I had my heart attack"  . CARDIAC CATHETERIZATION N/A 07/29/2016   Procedure: Left Heart Cath and Coronary Angiography;  Surgeon: Runell Gess, MD;  Location: [facility] INVASIVE CV LAB;  Service: Cardiovascular;  Laterality: N/A;  . CARDIAC CATHETERIZATION N/A 07/29/2016   Procedure: Coronary Stent Intervention;  Surgeon: Runell Gess, MD;  [facility];  Service: Cardiovascular;  Laterality: N/A;  . CARPAL TUNNEL RELEASE Bilateral   . CORONARY ANGIOPLASTY WITH STENT PLACEMENT  07/29/2016  . CORONARY ARTERY BYPASS GRAFT N/A 07/24/2020   Procedure: CORONARY ARTERY BYPASS GRAFTING (CABG), ON PUMP, TIMES FOUR, USING LEFT INTERNAL MAMMARY ARTERY AND ENDOSCOPICALLY HARVESTED RIGHT GREATER SAPHENOUS VEIN;  Surgeon: Corliss Skains, MD;  Location: [facility] OR;  Service: Open Heart Surgery;  Laterality: N/A;  FLOW TAC  . CORONARY STENT INTERVENTION N/A 05/30/2019   Procedure: CORONARY STENT INTERVENTION;  Surgeon: Lyn Records, MD;  Location: [facility] INVASIVE CV LAB;  Service: Cardiovascular;  Laterality: N/A;  . CORONARY STENT INTERVENTION N/A 11/02/2019   Procedure: CORONARY STENT INTERVENTION;  Surgeon: Iran Ouch,  MD;  [facility];  Service: Cardiovascular;  Laterality: N/A;  . ESOPHAGOGASTRODUODENOSCOPY N/A 09/22/2017   Procedure: ESOPHAGOGASTRODUODENOSCOPY (EGD);  Surgeon: Meryl Dare, MD;  Location: [facility] ENDOSCOPY;  Service: Endoscopy;  Laterality: N/A;  . INTRAVASCULAR PRESSURE WIRE/FFR STUDY N/A 07/22/2020   Procedure: INTRAVASCULAR PRESSURE WIRE/FFR STUDY;  Surgeon: Marykay Lex, MD;  Location: [facility] INVASIVE CV LAB;  Service: Cardiovascular;  Laterality: N/A;  . KNEE ARTHROSCOPY Bilateral    "2 on left; 1 on the right"  . LEFT HEART CATH AND CORONARY ANGIOGRAPHY N/A 11/02/2019   Procedure: LEFT HEART CATH AND CORONARY ANGIOGRAPHY;  Surgeon: Iran Ouch, MD;  [facility];  Service: Cardiovascular;  Laterality: N/A;  . LEFT HEART CATH AND CORONARY ANGIOGRAPHY N/A 07/22/2020   Procedure: LEFT HEART CATH AND CORONARY ANGIOGRAPHY;  Surgeon: Marykay Lex, MD;  Location: [facility] INVASIVE CV LAB;  Service: Cardiovascular;  Laterality: N/A;  . PERIPHERAL VASCULAR INTERVENTION Left 10/11/2020   popliteal and SFA stent placement   . PERIPHERAL VASCULAR INTERVENTION Left 10/10/2020   Procedure: PERIPHERAL VASCULAR INTERVENTION;  Surgeon: Sherren Kerns, MD;  Location: [facility] INVASIVE CV LAB;  Service: Cardiovascular;  Laterality: Left;  . RIGHT/LEFT HEART CATH AND CORONARY ANGIOGRAPHY N/A 05/30/2019   Procedure: RIGHT/LEFT HEART CATH AND CORONARY ANGIOGRAPHY;  Surgeon: Lyn Records, MD;  [facility];  Service: Cardiovascular;  Laterality: N/A;  . SHOULDER OPEN ROTATOR CUFF REPAIR Bilateral   . TEE WITHOUT CARDIOVERSION N/A 07/24/2020   Procedure: TRANSESOPHAGEAL ECHOCARDIOGRAM (TEE);  Surgeon: Corliss Skains, MD;  Location: [facility] OR;  Service: Open Heart Surgery;  Laterality: N/A;   Social History   Occupational History  . Occupation: disabled     Associate Professor: UNEMPLOYED  Tobacco Use  . Smoking status: Former Smoker    Packs/day: 1.00    Years: 34.00     Pack years: 34.00    Types: Cigarettes    Quit date: 07/12/2020    Years since quitting: 0.4  . Smokeless tobacco: Former Neurosurgeon    Types: Snuff, Chew  Vaping Use  . Vaping Use: Never used  Substance and Sexual Activity  . Alcohol use: Yes    Comment: RARE  . Drug use: No  . Sexual activity: Yes

## 2021-01-06 ENCOUNTER — Other Ambulatory Visit: Payer: Medicare Other

## 2021-01-12 ENCOUNTER — Other Ambulatory Visit: Payer: Self-pay

## 2021-01-12 DIAGNOSIS — I739 Peripheral vascular disease, unspecified: Secondary | ICD-10-CM

## 2021-01-13 ENCOUNTER — Other Ambulatory Visit: Payer: Self-pay

## 2021-01-13 ENCOUNTER — Ambulatory Visit (INDEPENDENT_AMBULATORY_CARE_PROVIDER_SITE_OTHER): Payer: Medicare Other

## 2021-01-13 DIAGNOSIS — I5043 Acute on chronic combined systolic (congestive) and diastolic (congestive) heart failure: Secondary | ICD-10-CM

## 2021-01-13 DIAGNOSIS — R609 Edema, unspecified: Secondary | ICD-10-CM

## 2021-01-13 LAB — ECHOCARDIOGRAM COMPLETE
Area-P 1/2: 3.42 cm2
Calc EF: 50.3 %
S' Lateral: 3.34 cm
Single Plane A2C EF: 47.8 %
Single Plane A4C EF: 52.2 %

## 2021-01-21 ENCOUNTER — Ambulatory Visit: Payer: Medicare Other | Admitting: Physician Assistant

## 2021-01-22 ENCOUNTER — Other Ambulatory Visit: Payer: Self-pay | Admitting: Orthopedic Surgery

## 2021-01-29 ENCOUNTER — Ambulatory Visit: Payer: Medicare Other | Admitting: Cardiovascular Disease

## 2021-02-05 ENCOUNTER — Ambulatory Visit (INDEPENDENT_AMBULATORY_CARE_PROVIDER_SITE_OTHER): Payer: Medicare HMO | Admitting: Orthopedic Surgery

## 2021-02-05 ENCOUNTER — Encounter: Payer: Self-pay | Admitting: Orthopedic Surgery

## 2021-02-05 DIAGNOSIS — Z89422 Acquired absence of other left toe(s): Secondary | ICD-10-CM | POA: Diagnosis not present

## 2021-02-05 DIAGNOSIS — I87332 Chronic venous hypertension (idiopathic) with ulcer and inflammation of left lower extremity: Secondary | ICD-10-CM | POA: Diagnosis not present

## 2021-02-05 DIAGNOSIS — L97929 Non-pressure chronic ulcer of unspecified part of left lower leg with unspecified severity: Secondary | ICD-10-CM

## 2021-02-05 NOTE — Progress Notes (Signed)
Office Visit Note   Patient: Joseph [name]           Date of Birth: [birth year]           MRN: [id number] Visit Date: 02/05/2021              Requested by: Marva Panda, NP [address] [place],  [name] [address] PCP: Marva Panda, NP  Chief Complaint  Patient presents with  . Left Foot - Follow-up, Pain      HPI: Patient is a 50 year old gentleman who is seen for 2 separate issues.  #1 he is status post a left foot fourth ray amputation and is currently using Percocet as prescribed by his primary care physician he states there is no drainage he is off his antibiotics.  Patient states he has a new traumatic venous stasis ulcer of the left leg from blunt trauma from last week.  Assessment & Plan: Visit Diagnoses:  1. Chronic venous hypertension (idiopathic) with ulcer and inflammation of left lower extremity (HCC)   2. History of complete ray amputation of fourth toe of left foot (HCC)     Plan: Discussed the importance of patient wearing his knee-high compression stockings to heal the ulcer and decrease swelling of the left lower extremity.  Recommended he wear the sock around-the-clock change daily and wash his leg with soap and water he may resume normal activities.  Follow-Up Instructions: Return in about 4 weeks (around 03/05/2021).   Ortho Exam  Patient is alert, oriented, no adenopathy, well-dressed, normal affect, normal respiratory effort. Examination patient has significant venous stasis swelling in the left lower extremity with pitting edema there is a traumatic venous stasis ulcer over the mid tibia which is 10 mm in diameter 1 mm deep with healthy granulation tissue no exposed bone or tendon the fourth ray amputation is healed well he does have an ulcer Wagner grade 1 beneath the third metatarsal head the callus was pared the ulcer is 5 mm in diameter 1 mm deep he has onychomycotic nails x4 the nails were trimmed x4 without  complication.  Imaging: No results found. No images are attached to the encounter.  Labs: Lab Results  Component Value Date   HGBA1C 9.4 (H) 07/18/2020   HGBA1C 8.2 (H) 11/02/2019   HGBA1C 8.2 (H) 11/01/2019   ESRSEDRATE 41 (H) 09/16/2017   CRP 8.9 (H) 09/16/2017   REPTSTATUS 07/23/2020 FINAL 07/18/2020   REPTSTATUS 07/23/2020 FINAL 07/18/2020   CULT  07/18/2020    NO GROWTH 5 DAYS Performed at [facility], [address], [place], [name] [address]    CULT  07/18/2020    NO GROWTH 5 DAYS Performed at [facility], [address] [place], [name] [address]      Lab Results  Component Value Date   ALBUMIN 2.9 (L) 07/27/2020   ALBUMIN 2.9 (L) 07/26/2020   ALBUMIN 3.0 (L) 07/21/2020    Lab Results  Component Value Date   MG 2.0 07/25/2020   MG 2.1 07/25/2020   MG 2.3 07/24/2020   No results found for: VD25OH  No results found for: PREALBUMIN CBC EXTENDED Latest Ref Rng & Units 10/11/2020 10/10/2020 08/13/2020  WBC 4.0 - 10.5 K/uL 8.2 - -  RBC 4.22 - 5.81 MIL/uL 3.98(L) - -  HGB 13.0 - 17.0 g/dL 0.1(U) 11.9(L) 8.8(L)  HCT 39.0 - 52.0 % 32.9(L) 35.0(L) 26.0(L)  PLT 150 - 400 K/uL 280 - -  NEUTROABS 1.7 - 7.7 K/uL - - -  LYMPHSABS 0.7 - 4.0 K/uL - - -     There is no height or weight on file to calculate BMI.  Orders:  No orders of the defined types were placed in this encounter.  No orders of the defined types were placed in this encounter.    Procedures: No procedures performed  Clinical Data: No additional findings.  ROS:  All other systems negative, except as noted in the HPI. Review of Systems  Objective: Vital Signs: There were no vitals taken for this visit.  Specialty Comments:  No specialty comments available.  PMFS History: Patient Active Problem List   Diagnosis Date Noted  . PAD (peripheral artery disease) (HCC) 10/10/2020  . Osteomyelitis of fourth toe of right foot (HCC)   . S/P CABG x 4 07/24/2020  . Non-ST elevation  (NSTEMI) myocardial infarction (HCC) 07/22/2020  . Acute on chronic diastolic heart failure (HCC) 07/22/2020  . Hypotension 07/18/2020  . Left leg swelling 07/18/2020  . Elevated troponin 07/18/2020  . AKI (acute kidney injury) (HCC) 07/18/2020  . Elevated brain natriuretic peptide (BNP) level 07/18/2020  . GERD (gastroesophageal reflux disease) 07/18/2020  . Severe Sepsis secondary to Lt leg/Foot Cellulitis 07/18/2020  . Severe sepsis --Severe Sepsis secondary to Lt leg/Foot Cellulitis with hypotension and AKI 07/18/2020  . Unstable angina (HCC) 11/01/2019  . Edema 06/13/2019  . Leukocytosis 05/31/2019  . Smoker 03/08/2019  . Chronic pain 03/08/2019  . Chronic back pain 02/16/2019  . Deep venous thrombosis (HCC) 02/16/2019  . Obesity, Class III, BMI 40-49.9 (morbid obesity) (HCC) 02/16/2019  . Peripheral nerve disease 02/16/2019  . COPD (chronic obstructive pulmonary disease) (HCC) 01/22/2019  . Anxiety disorder 03/26/2018  . Open wound of toe 10/12/2017  . Sinusitis 10/12/2017  . Abdominal pain   . Loss of weight   . Diabetic foot infection (HCC) 09/16/2017  . Uncontrolled type 2 diabetes mellitus with hyperglycemia, with long-term current use of insulin (HCC) 09/16/2017  . Cellulitis of right foot   . Chronic ulcer of great toe of right foot (HCC) 09/09/2017  . Recurrent chest pain 07/29/2016  . Hyperglycemia   . Insulin dependent type 2 diabetes mellitus (HCC) 04/29/2015  . OSA (obstructive sleep apnea) 05/08/2013  . History of pulmonary embolus (PE) 04/27/2013  . CAD -S/P PCI 11/02/19  09/10/2008  . Dyslipidemia 09/09/2008   Past Medical History:  Diagnosis Date  . Anxiety   . Arthritis    "knees, shoulders, hips, ankles" (07/29/2016)  . Asthma   . CAD (coronary artery disease)    a. 2017: s/p BMS to distal Cx; b. LHC 05/30/2019: 80% mid, distal RCA s/p DES, 30% narrowing of d LM, widely patent LAD w/ luminal irregularities, widely patent stent in dCX  w/ 90+% stenosis  distal to stent beofre small trifurcating obtuse marginal (potentially area of restenosis)  . Chronic bronchitis (HCC)   . Chronic ulcer of right great toe (HCC) 09/19/2017  . COPD (chronic obstructive pulmonary disease) (HCC)   . Depression   . DVT (deep venous thrombosis) (HCC)   . GERD (gastroesophageal reflux disease)   . Hyperlipidemia   . Hypertension   . Myocardial infarction (HCC) 1996   "light one"  . PE (pulmonary embolism) 04/2013   On chronic Xarelto  . Peripheral nerve disease   . Pneumonia "several times"  . Sleep apnea   . Type II diabetes mellitus (HCC)     Family History  Problem Relation Age of Onset  . Hypertension Brother   .  Hypertension Father   . Diabetes Other   . Hyperlipidemia Other     Past Surgical History:  Procedure Laterality Date  . ABDOMINAL AORTOGRAM W/LOWER EXTREMITY N/A 10/10/2020   Procedure: ABDOMINAL AORTOGRAM W/LOWER EXTREMITY;  Surgeon: Sherren Kerns, MD;  [facility] INV[facility];  Service: Cardiovascular;  Laterality: N/A;  . AMPUTATION Right 09/21/2017   Procedure: RIGHT GREAT TOE AMPUTATION, POSSIBLE VAC;  Surgeon: Tarry Kos, MD;  Location: [facility] OR;  Service: Orthopedics;  Laterality: Right;  . AMPUTATION Left 08/13/2020   Procedure: LEFT FOOT 4TH RAY AMPUTATION;  Surgeon: [name], MD;  Location: [facility] OR;  Service: Orthopedics;  Laterality: Left;  . CARDIAC CATHETERIZATION  2006  . CARDIAC CATHETERIZATION  1996   "@ [facility]; when I had my heart attack"  . CARDIAC CATHETERIZATION N/A 07/29/2016   Procedure: Left Heart Cath and Coronary Angiography;  Surgeon: Runell Gess, MD;  Location: [facility] INVASIVE CV LAB;  Service: Cardiovascular;  Laterality: N/A;  . CARDIAC CATHETERIZATION N/A 07/29/2016   Procedure: Coronary Stent Intervention;  Surgeon: Runell Gess, MD;  [facility]: [facility];  Service: Cardiovascular;  Laterality: N/A;  . CARPAL TUNNEL RELEASE Bilateral   . CORONARY ANGIOPLASTY WITH STENT PLACEMENT   07/29/2016  . CORONARY ARTERY BYPASS GRAFT N/A 07/24/2020   Procedure: CORONARY ARTERY BYPASS GRAFTING (CABG), ON PUMP, TIMES FOUR, USING LEFT INTERNAL MAMMARY ARTERY AND ENDOSCOPICALLY HARVESTED RIGHT GREATER SAPHENOUS VEIN;  Surgeon: Corliss Skains, MD;  Location: [facility] OR;  Service: Open Heart Surgery;  Laterality: N/A;  FLOW TAC  . CORONARY STENT INTERVENTION N/A 05/30/2019   Procedure: CORONARY STENT INTERVENTION;  Surgeon: Lyn Records, MD;  Location: [facility] INVASIVE CV LAB;  Service: Cardiovascular;  Laterality: N/A;  . CORONARY STENT INTERVENTION N/A 11/02/2019   Procedure: CORONARY STENT INTERVENTION;  Surgeon: Iran Ouch, MD;  [facility]: [facility];  Service: Cardiovascular;  Laterality: N/A;  . ESOPHAGOGASTRODUODENOSCOPY N/A 09/22/2017   Procedure: ESOPHAGOGASTRODUODENOSCOPY (EGD);  Surgeon: Meryl Dare, MD;  Location: [facility] ENDOSCOPY;  Service: Endoscopy;  Laterality: N/A;  . INTRAVASCULAR PRESSURE WIRE/FFR STUDY N/A 07/22/2020   Procedure: INTRAVASCULAR PRESSURE WIRE/FFR STUDY;  Surgeon: Marykay Lex, MD;  Location: [facility] INVASIVE CV LAB;  Service: Cardiovascular;  Laterality: N/A;  . KNEE ARTHROSCOPY Bilateral    "2 on left; 1 on the right"  . LEFT HEART CATH AND CORONARY ANGIOGRAPHY N/A 11/02/2019   Procedure: LEFT HEART CATH AND CORONARY ANGIOGRAPHY;  Surgeon: Iran Ouch, MD;  [facility];  Service: Cardiovascular;  Laterality: N/A;  . LEFT HEART CATH AND CORONARY ANGIOGRAPHY N/A 07/22/2020   Procedure: LEFT HEART CATH AND CORONARY ANGIOGRAPHY;  Surgeon: Marykay Lex, MD;  Location: [facility] INVASIVE CV LAB;  Service: Cardiovascular;  Laterality: N/A;  . PERIPHERAL VASCULAR INTERVENTION Left 10/11/2020   popliteal and SFA stent placement   . PERIPHERAL VASCULAR INTERVENTION Left 10/10/2020   Procedure: PERIPHERAL VASCULAR INTERVENTION;  Surgeon: Sherren Kerns, MD;  Location: [facility] INVASIVE CV LAB;  Service: Cardiovascular;  Laterality: Left;  .  RIGHT/LEFT HEART CATH AND CORONARY ANGIOGRAPHY N/A 05/30/2019   Procedure: RIGHT/LEFT HEART CATH AND CORONARY ANGIOGRAPHY;  Surgeon: Lyn Records, MD;  Location: [facility] INVASIVE CV LAB;  Service: Cardiovascular;  Laterality: N/A;  . SHOULDER OPEN ROTATOR CUFF REPAIR Bilateral   . TEE WITHOUT CARDIOVERSION N/A 07/24/2020   Procedure: TRANSESOPHAGEAL ECHOCARDIOGRAM (TEE);  Surgeon: Corliss Skains, MD;  Location: [facility] OR;  Service: Open Heart  Surgery;  Laterality: N/A;   Social History   Occupational History  . Occupation: disabled     Associate Professormployer: UNEMPLOYED  Tobacco Use  . Smoking status: Former Smoker    Packs/day: 1.00    Years: 34.00    Pack years: 34.00    Types: Cigarettes    Quit date: 07/12/2020    Years since quitting: 0.5  . Smokeless tobacco: Former NeurosurgeonUser    Types: Snuff, Chew  Vaping Use  . Vaping Use: Never used  Substance and Sexual Activity  . Alcohol use: Yes    Comment: RARE  . Drug use: No  . Sexual activity: Yes

## 2021-02-18 ENCOUNTER — Other Ambulatory Visit: Payer: Self-pay

## 2021-02-18 ENCOUNTER — Emergency Department (HOSPITAL_COMMUNITY): Payer: Medicare HMO

## 2021-02-18 ENCOUNTER — Encounter (HOSPITAL_COMMUNITY): Payer: Self-pay | Admitting: Emergency Medicine

## 2021-02-18 ENCOUNTER — Observation Stay (HOSPITAL_COMMUNITY)
Admission: EM | Admit: 2021-02-18 | Discharge: 2021-02-19 | Disposition: A | Discharge status: Home / Self Care | Payer: Medicare HMO | Attending: Internal Medicine | Admitting: Internal Medicine

## 2021-02-18 ENCOUNTER — Emergency Department (HOSPITAL_BASED_OUTPATIENT_CLINIC_OR_DEPARTMENT_OTHER): Payer: Medicare HMO

## 2021-02-18 DIAGNOSIS — Z79899 Other long term (current) drug therapy: Secondary | ICD-10-CM | POA: Insufficient documentation

## 2021-02-18 DIAGNOSIS — J45909 Unspecified asthma, uncomplicated: Secondary | ICD-10-CM | POA: Insufficient documentation

## 2021-02-18 DIAGNOSIS — Z87891 Personal history of nicotine dependence: Secondary | ICD-10-CM | POA: Diagnosis not present

## 2021-02-18 DIAGNOSIS — E1165 Type 2 diabetes mellitus with hyperglycemia: Secondary | ICD-10-CM

## 2021-02-18 DIAGNOSIS — Z951 Presence of aortocoronary bypass graft: Secondary | ICD-10-CM | POA: Insufficient documentation

## 2021-02-18 DIAGNOSIS — I2510 Atherosclerotic heart disease of native coronary artery without angina pectoris: Secondary | ICD-10-CM | POA: Insufficient documentation

## 2021-02-18 DIAGNOSIS — I129 Hypertensive chronic kidney disease with stage 1 through stage 4 chronic kidney disease, or unspecified chronic kidney disease: Secondary | ICD-10-CM | POA: Diagnosis not present

## 2021-02-18 DIAGNOSIS — Z7984 Long term (current) use of oral hypoglycemic drugs: Secondary | ICD-10-CM | POA: Diagnosis not present

## 2021-02-18 DIAGNOSIS — J449 Chronic obstructive pulmonary disease, unspecified: Secondary | ICD-10-CM | POA: Diagnosis not present

## 2021-02-18 DIAGNOSIS — L03116 Cellulitis of left lower limb: Principal | ICD-10-CM | POA: Insufficient documentation

## 2021-02-18 DIAGNOSIS — Z7982 Long term (current) use of aspirin: Secondary | ICD-10-CM | POA: Diagnosis not present

## 2021-02-18 DIAGNOSIS — E119 Type 2 diabetes mellitus without complications: Secondary | ICD-10-CM | POA: Diagnosis not present

## 2021-02-18 DIAGNOSIS — L089 Local infection of the skin and subcutaneous tissue, unspecified: Secondary | ICD-10-CM

## 2021-02-18 DIAGNOSIS — N182 Chronic kidney disease, stage 2 (mild): Secondary | ICD-10-CM | POA: Insufficient documentation

## 2021-02-18 DIAGNOSIS — R2242 Localized swelling, mass and lump, left lower limb: Secondary | ICD-10-CM | POA: Diagnosis present

## 2021-02-18 DIAGNOSIS — Z20822 Contact with and (suspected) exposure to covid-19: Secondary | ICD-10-CM | POA: Diagnosis not present

## 2021-02-18 DIAGNOSIS — I5033 Acute on chronic diastolic (congestive) heart failure: Secondary | ICD-10-CM | POA: Diagnosis not present

## 2021-02-18 DIAGNOSIS — M79609 Pain in unspecified limb: Secondary | ICD-10-CM | POA: Diagnosis not present

## 2021-02-18 DIAGNOSIS — L0390 Cellulitis, unspecified: Secondary | ICD-10-CM | POA: Diagnosis present

## 2021-02-18 DIAGNOSIS — M7989 Other specified soft tissue disorders: Secondary | ICD-10-CM | POA: Diagnosis not present

## 2021-02-18 LAB — COMPREHENSIVE METABOLIC PANEL
ALT: 20 U/L (ref 0–44)
AST: 18 U/L (ref 15–41)
Albumin: 2.6 g/dL — ABNORMAL LOW (ref 3.5–5.0)
Alkaline Phosphatase: 69 U/L (ref 38–126)
Anion gap: 10 (ref 5–15)
BUN: 32 mg/dL — ABNORMAL HIGH (ref 6–20)
CO2: 26 mmol/L (ref 22–32)
Calcium: 8.9 mg/dL (ref 8.9–10.3)
Chloride: 100 mmol/L (ref 98–111)
Creatinine, Ser: 1.8 mg/dL — ABNORMAL HIGH (ref 0.61–1.24)
GFR, Estimated: 46 mL/min — ABNORMAL LOW (ref >60)
Glucose, Bld: 421 mg/dL — ABNORMAL HIGH (ref 70–99)
Potassium: 4.7 mmol/L (ref 3.5–5.1)
Sodium: 136 mmol/L (ref 135–145)
Total Bilirubin: 0.3 mg/dL (ref 0.3–1.2)
Total Protein: 6.8 g/dL (ref 6.5–8.1)

## 2021-02-18 LAB — HIV ANTIBODY (ROUTINE TESTING W REFLEX): HIV Screen 4th Generation wRfx: NONREACTIVE

## 2021-02-18 LAB — BETA-HYDROXYBUTYRIC ACID: Beta-Hydroxybutyric Acid: 0.08 mmol/L (ref 0.05–0.27)

## 2021-02-18 LAB — CREATININE, URINE, RANDOM: Creatinine, Urine: 103.84 mg/dL

## 2021-02-18 LAB — URINALYSIS, ROUTINE W REFLEX MICROSCOPIC
Bacteria, UA: NONE SEEN
Bilirubin Urine: NEGATIVE
Glucose, UA: 150 mg/dL — AB
Hgb urine dipstick: NEGATIVE
Ketones, ur: NEGATIVE mg/dL
Leukocytes,Ua: NEGATIVE
Nitrite: NEGATIVE
Protein, ur: 30 mg/dL — AB
Specific Gravity, Urine: 1.014 (ref 1.005–1.030)
pH: 5 (ref 5.0–8.0)

## 2021-02-18 LAB — SARS CORONAVIRUS 2 (TAT 6-24 HRS): SARS Coronavirus 2: NEGATIVE

## 2021-02-18 LAB — CBC WITH DIFFERENTIAL/PLATELET
Abs Immature Granulocytes: 0.1 10*3/uL — ABNORMAL HIGH (ref 0.00–0.07)
Basophils Absolute: 0.1 10*3/uL (ref 0.0–0.1)
Basophils Relative: 1 %
Eosinophils Absolute: 0.2 10*3/uL (ref 0.0–0.5)
Eosinophils Relative: 3 %
HCT: 36.3 % — ABNORMAL LOW (ref 39.0–52.0)
Hemoglobin: 11.1 g/dL — ABNORMAL LOW (ref 13.0–17.0)
Immature Granulocytes: 2 %
Lymphocytes Relative: 23 %
Lymphs Abs: 1.5 10*3/uL (ref 0.7–4.0)
MCH: 25.2 pg — ABNORMAL LOW (ref 26.0–34.0)
MCHC: 30.6 g/dL (ref 30.0–36.0)
MCV: 82.3 fL (ref 80.0–100.0)
Monocytes Absolute: 0.6 10*3/uL (ref 0.1–1.0)
Monocytes Relative: 9 %
Neutro Abs: 4.2 10*3/uL (ref 1.7–7.7)
Neutrophils Relative %: 62 %
Platelets: 374 10*3/uL (ref 150–400)
RBC: 4.41 MIL/uL (ref 4.22–5.81)
RDW: 15.3 % (ref 11.5–15.5)
WBC: 6.6 10*3/uL (ref 4.0–10.5)
nRBC: 0 % (ref 0.0–0.2)

## 2021-02-18 LAB — CBC
HCT: 34.7 % — ABNORMAL LOW (ref 39.0–52.0)
Hemoglobin: 10.3 g/dL — ABNORMAL LOW (ref 13.0–17.0)
MCH: 24.3 pg — ABNORMAL LOW (ref 26.0–34.0)
MCHC: 29.7 g/dL — ABNORMAL LOW (ref 30.0–36.0)
MCV: 81.8 fL (ref 80.0–100.0)
Platelets: 324 K/uL (ref 150–400)
RBC: 4.24 MIL/uL (ref 4.22–5.81)
RDW: 15.1 % (ref 11.5–15.5)
WBC: 7.1 K/uL (ref 4.0–10.5)
nRBC: 0 % (ref 0.0–0.2)

## 2021-02-18 LAB — HEMOGLOBIN A1C
Hgb A1c MFr Bld: 9.9 % — ABNORMAL HIGH (ref 4.8–5.6)
Mean Plasma Glucose: 237.43 mg/dL

## 2021-02-18 LAB — BASIC METABOLIC PANEL
Anion gap: 10 (ref 5–15)
BUN: 31 mg/dL — ABNORMAL HIGH (ref 6–20)
CO2: 26 mmol/L (ref 22–32)
Calcium: 8.7 mg/dL — ABNORMAL LOW (ref 8.9–10.3)
Chloride: 100 mmol/L (ref 98–111)
Creatinine, Ser: 1.66 mg/dL — ABNORMAL HIGH (ref 0.61–1.24)
GFR, Estimated: 51 mL/min — ABNORMAL LOW (ref >60)
Glucose, Bld: 358 mg/dL — ABNORMAL HIGH (ref 70–99)
Potassium: 4.6 mmol/L (ref 3.5–5.1)
Sodium: 136 mmol/L (ref 135–145)

## 2021-02-18 LAB — GLUCOSE, CAPILLARY: Glucose-Capillary: 396 mg/dL — ABNORMAL HIGH (ref 70–99)

## 2021-02-18 LAB — SODIUM, URINE, RANDOM: Sodium, Ur: 62 mmol/L

## 2021-02-18 LAB — LACTIC ACID, PLASMA: Lactic Acid, Venous: 1.7 mmol/L (ref 0.5–1.9)

## 2021-02-18 MED ORDER — INSULIN LISPRO (1 UNIT DIAL) 100 UNIT/ML (KWIKPEN)
15.0000 [IU] | PEN_INJECTOR | Freq: Three times a day (TID) | SUBCUTANEOUS | Status: DC
Start: 2021-02-19 — End: 2021-02-18

## 2021-02-18 MED ORDER — HYDROCHLOROTHIAZIDE 12.5 MG PO CAPS
12.5000 mg | ORAL_CAPSULE | Freq: Every day | ORAL | Status: DC
  Administered 2021-02-19: 12.5 mg via ORAL
  Filled 2021-02-18: qty 1

## 2021-02-18 MED ORDER — LACTATED RINGERS IV BOLUS
1000.0000 mL | Freq: Once | INTRAVENOUS | Status: AC
  Administered 2021-02-18: 1000 mL via INTRAVENOUS

## 2021-02-18 MED ORDER — ATORVASTATIN CALCIUM 80 MG PO TABS: 80.0000 mg | ORAL_TABLET | Freq: Every day | ORAL | Status: DC

## 2021-02-18 MED ORDER — OXYCODONE HCL 5 MG PO TABS
10.0000 mg | ORAL_TABLET | Freq: Four times a day (QID) | ORAL | Status: DC | PRN
  Administered 2021-02-18 – 2021-02-19 (×2): 10 mg via ORAL
  Filled 2021-02-18 (×2): qty 2

## 2021-02-18 MED ORDER — ACETAMINOPHEN 325 MG PO TABS
650.0000 mg | ORAL_TABLET | Freq: Four times a day (QID) | ORAL | Status: DC | PRN
  Administered 2021-02-18 – 2021-02-19 (×2): 650 mg via ORAL
  Filled 2021-02-18 (×2): qty 2

## 2021-02-18 MED ORDER — BUPROPION HCL ER (SR) 150 MG PO TB12
150.0000 mg | ORAL_TABLET | Freq: Two times a day (BID) | ORAL | Status: DC
  Administered 2021-02-19: 150 mg via ORAL
  Filled 2021-02-18: qty 1

## 2021-02-18 MED ORDER — FUROSEMIDE 40 MG PO TABS
40.0000 mg | ORAL_TABLET | Freq: Every day | ORAL | Status: DC
  Administered 2021-02-19: 40 mg via ORAL
  Filled 2021-02-18: qty 1

## 2021-02-18 MED ORDER — INSULIN ASPART 100 UNIT/ML ~~LOC~~ SOLN
0.0000 [IU] | Freq: Three times a day (TID) | SUBCUTANEOUS | Status: DC
  Administered 2021-02-19: 7 [IU] via SUBCUTANEOUS
  Administered 2021-02-19: 13:00:00 11 [IU] via SUBCUTANEOUS

## 2021-02-18 MED ORDER — FLUTICASONE PROPIONATE 50 MCG/ACT NA SUSP
2.0000 | Freq: Every day | NASAL | Status: DC | PRN
  Filled 2021-02-18: qty 16

## 2021-02-18 MED ORDER — CEFAZOLIN SODIUM-DEXTROSE 2-4 GM/100ML-% IV SOLN
2.0000 g | Freq: Once | INTRAVENOUS | Status: AC
  Administered 2021-02-18: 2 g via INTRAVENOUS
  Filled 2021-02-18: qty 100

## 2021-02-18 MED ORDER — CARVEDILOL 3.125 MG PO TABS
3.1250 mg | ORAL_TABLET | Freq: Two times a day (BID) | ORAL | Status: DC
Start: 2021-02-19 — End: 2021-02-19
  Administered 2021-02-19: 3.125 mg via ORAL
  Filled 2021-02-18 (×2): qty 1

## 2021-02-18 MED ORDER — PANTOPRAZOLE SODIUM 40 MG PO TBEC
40.0000 mg | DELAYED_RELEASE_TABLET | Freq: Every day | ORAL | Status: DC
  Administered 2021-02-19: 40 mg via ORAL
  Filled 2021-02-18: qty 1

## 2021-02-18 MED ORDER — PREGABALIN 75 MG PO CAPS
150.0000 mg | ORAL_CAPSULE | Freq: Three times a day (TID) | ORAL | Status: DC
Start: 2021-02-18 — End: 2021-02-19
  Administered 2021-02-18 – 2021-02-19 (×2): 150 mg via ORAL
  Filled 2021-02-18 (×2): qty 2

## 2021-02-18 MED ORDER — CLOPIDOGREL BISULFATE 75 MG PO TABS
75.0000 mg | ORAL_TABLET | Freq: Every day | ORAL | Status: DC
  Administered 2021-02-19: 75 mg via ORAL
  Filled 2021-02-18: qty 1

## 2021-02-18 MED ORDER — ENOXAPARIN SODIUM 40 MG/0.4ML ~~LOC~~ SOLN
40.0000 mg | SUBCUTANEOUS | Status: DC
  Administered 2021-02-18: 40 mg via SUBCUTANEOUS
  Filled 2021-02-18: qty 0.4

## 2021-02-18 MED ORDER — ACETAMINOPHEN 650 MG RE SUPP: 650.0000 mg | Freq: Four times a day (QID) | RECTAL | Status: DC | PRN

## 2021-02-18 MED ORDER — ASPIRIN EC 81 MG PO TBEC
81.0000 mg | DELAYED_RELEASE_TABLET | Freq: Every day | ORAL | Status: DC
  Administered 2021-02-19: 81 mg via ORAL
  Filled 2021-02-18: qty 1

## 2021-02-18 MED ORDER — INSULIN GLARGINE 100 UNIT/ML ~~LOC~~ SOLN
40.0000 [IU] | Freq: Two times a day (BID) | SUBCUTANEOUS | Status: DC
  Administered 2021-02-18 – 2021-02-19 (×2): 40 [IU] via SUBCUTANEOUS
  Filled 2021-02-18 (×3): qty 0.4

## 2021-02-18 MED ORDER — ALBUTEROL SULFATE HFA 108 (90 BASE) MCG/ACT IN AERS
2.0000 | INHALATION_SPRAY | Freq: Four times a day (QID) | RESPIRATORY_TRACT | Status: DC | PRN
  Filled 2021-02-18: qty 6.7

## 2021-02-18 MED ORDER — LACTATED RINGERS IV BOLUS
500.0000 mL | Freq: Once | INTRAVENOUS | Status: AC
  Administered 2021-02-18: 500 mL via INTRAVENOUS

## 2021-02-18 MED ORDER — ALPRAZOLAM 0.5 MG PO TABS
1.0000 mg | ORAL_TABLET | Freq: Every day | ORAL | Status: DC
  Administered 2021-02-18: 1 mg via ORAL
  Filled 2021-02-18: qty 2

## 2021-02-18 MED ORDER — ESCITALOPRAM OXALATE 10 MG PO TABS
10.0000 mg | ORAL_TABLET | Freq: Every day | ORAL | Status: DC
  Administered 2021-02-19: 10 mg via ORAL
  Filled 2021-02-18: qty 1

## 2021-02-18 NOTE — Plan of Care (Signed)
Care Plan Added 
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
## 2021-02-18 NOTE — ED Notes (Signed)
Pt states he take Oxycodone 15mg  tabs five times a day. No order for medication at this time. , MD paged about if order could be put in. Awaiting orders at this time.

## 2021-02-18 NOTE — ED Triage Notes (Signed)
Patient sent to ED for evaluation of infection in left lower leg that was being treated outpatient but PCP wants patient to receive IV abx. Patient alert, oriented, and in no apparent distress at this time.

## 2021-02-18 NOTE — Progress Notes (Signed)
Bilateral lower extremity venous study completed.      Please see CV Proc for preliminary results.   Milinda Sweeney, RVT  

## 2021-02-18 NOTE — ED Provider Notes (Signed)
[facility] EMERGENCY DEPARTMENT Provider Note   CSN: [id number] Arrival date & time: 02/18/21  1226     History Chief Complaint  Patient presents with  . Wound Infection    Joseph [name] is a 50 y.o. male.  HPI      Joseph [name] is a 50 y.o. male, with a history of COPD, DVT, PE, DM, hyperlipidemia, HTN, presenting to the ED with worsening swelling, erythema, pain to the left lower extremity. Patient states he sustained a wound to the left anterior lower leg about 2 weeks ago, scraping it on a pedal in a car. He was seen on February 24 by his PCP and placed on doxycycline.  He has been taking doxycycline since then. He denies any purulence from the wound. He does endorse some intermittent increased shortness of breath over the last couple weeks. No Covid vaccination. Denies fever/chills, acute numbness, acute weakness, chest pain, abdominal pain, syncope, cough, or any other complaints.  Past Medical History:  Diagnosis Date  . Anxiety   . Arthritis    "knees, shoulders, hips, ankles" (07/29/2016)  . Asthma   . CAD (coronary artery disease)    a. 2017: s/p BMS to distal Cx; b. LHC 05/30/2019: 80% mid, distal RCA s/p DES, 30% narrowing of d LM, widely patent LAD w/ luminal irregularities, widely patent stent in [place]  w/ 90+% stenosis distal to stent beofre small trifurcating obtuse marginal (potentially area of restenosis)  . Chronic bronchitis ([place])   . Chronic ulcer of right great toe ([place]) 09/19/2017  . COPD (chronic obstructive pulmonary disease) ([place])   . Depression   . DVT (deep venous thrombosis) ([place])   . GERD (gastroesophageal reflux disease)   . Hyperlipidemia   . Hypertension   . Myocardial infarction ([place]) 1996   "light one"  . PE (pulmonary embolism) 04/2013   On chronic Xarelto  . Peripheral nerve disease   . Pneumonia "several times"  . Sleep apnea   . Type II diabetes mellitus [facility])     Patient Active Problem List   Diagnosis  Date Noted  . PAD (peripheral artery disease) ([place]) 10/10/2020  . Osteomyelitis of fourth toe of right foot ([place])   . S/P CABG x 4 07/24/2020  . Non-ST elevation (NSTEMI) myocardial infarction ([place]) 07/22/2020  . Acute on chronic diastolic heart failure ([place]) 07/22/2020  . Hypotension 07/18/2020  . Left leg swelling 07/18/2020  . Elevated troponin 07/18/2020  . AKI (acute kidney injury) ([place]) 07/18/2020  . Elevated brain natriuretic peptide (BNP) level 07/18/2020  . GERD (gastroesophageal reflux disease) 07/18/2020  . Severe Sepsis secondary to Lt leg/Foot Cellulitis 07/18/2020  . Severe sepsis --Severe Sepsis secondary to Lt leg/Foot Cellulitis with hypotension and AKI 07/18/2020  . Unstable angina ([place]) 11/01/2019  . Edema 06/13/2019  . Leukocytosis 05/31/2019  . Smoker 03/08/2019  . Chronic pain 03/08/2019  . Chronic back pain 02/16/2019  . Deep venous thrombosis ([place]) 02/16/2019  . Obesity, Class III, BMI 40-49.9 (morbid obesity) ([place]) 02/16/2019  . Peripheral nerve disease 02/16/2019  . COPD (chronic obstructive pulmonary disease) ([place]) 01/22/2019  . Anxiety disorder 03/26/2018  . Open wound of toe 10/12/2017  . Sinusitis 10/12/2017  . Abdominal pain   . Loss of weight   . Diabetic foot infection ([place]) 09/16/2017  . Uncontrolled type 2 diabetes mellitus with hyperglycemia, with long-term current use of insulin ([place]) 09/16/2017  . Cellulitis of right foot   . Chronic ulcer of great toe of  right foot ([place]) 09/09/2017  . Recurrent chest pain 07/29/2016  . Hyperglycemia   . Insulin dependent type 2 diabetes mellitus ([place]) 04/29/2015  . OSA (obstructive sleep apnea) 05/08/2013  . History of pulmonary embolus (PE) 04/27/2013  . CAD -S/P PCI 11/02/19  09/10/2008  . Dyslipidemia 09/09/2008    Past Surgical History:  Procedure Laterality Date  . ABDOMINAL AORTOGRAM W/LOWER EXTREMITY N/A 10/10/2020   Procedure: ABDOMINAL AORTOGRAM W/LOWER EXTREMITY;  Surgeon: Elam Dutch, MD;  Location: [place] CV LAB;  Service: Cardiovascular;  Laterality: N/A;  . AMPUTATION Right 09/21/2017   Procedure: RIGHT GREAT TOE AMPUTATION, POSSIBLE VAC;  Surgeon: Leandrew Koyanagi, MD;  Location: [facility];  Service: Orthopedics;  Laterality: Right;  . AMPUTATION Left 08/13/2020   Procedure: LEFT FOOT 4TH RAY AMPUTATION;  Surgeon: Newt Minion, MD;  Location: [facility];  Service: Orthopedics;  Laterality: Left;  . CARDIAC CATHETERIZATION  2006  . CARDIAC CATHETERIZATION  1996   "@ [facility]; when I had my heart attack"  . CARDIAC CATHETERIZATION N/A 07/29/2016   Procedure: Left Heart Cath and Coronary Angiography;  Surgeon: Lorretta Harp, MD;  Location: [place] CV LAB;  Service: Cardiovascular;  Laterality: N/A;  . CARDIAC CATHETERIZATION N/A 07/29/2016   Procedure: Coronary Stent Intervention;  Surgeon: Lorretta Harp, MD;  Location: [place] CV LAB;  Service: Cardiovascular;  Laterality: N/A;  . CARPAL TUNNEL RELEASE Bilateral   . CORONARY ANGIOPLASTY WITH STENT PLACEMENT  07/29/2016  . CORONARY ARTERY BYPASS GRAFT N/A 07/24/2020   Procedure: CORONARY ARTERY BYPASS GRAFTING (CABG), ON PUMP, TIMES FOUR, USING LEFT INTERNAL MAMMARY ARTERY AND ENDOSCOPICALLY HARVESTED RIGHT GREATER SAPHENOUS VEIN;  Surgeon: Lajuana Matte, MD;  Location: [facility];  Service: Open Heart Surgery;  Laterality: N/A;  FLOW TAC  . CORONARY STENT INTERVENTION N/A 05/30/2019   Procedure: CORONARY STENT INTERVENTION;  Surgeon: Belva Crome, MD;  Location: [place] CV LAB;  Service: Cardiovascular;  Laterality: N/A;  . CORONARY STENT INTERVENTION N/A 11/02/2019   Procedure: CORONARY STENT INTERVENTION;  Surgeon: Wellington Hampshire, MD;  Location: [place] CV LAB;  Service: Cardiovascular;  Laterality: N/A;  . ESOPHAGOGASTRODUODENOSCOPY N/A 09/22/2017   Procedure: ESOPHAGOGASTRODUODENOSCOPY (EGD);  Surgeon: Ladene Artist, MD;  Location: [place] ENDOSCOPY;  Service: Endoscopy;  Laterality: N/A;  . INTRAVASCULAR  PRESSURE WIRE/FFR STUDY N/A 07/22/2020   Procedure: INTRAVASCULAR PRESSURE WIRE/FFR STUDY;  Surgeon: Leonie Man, MD;  Location: [place] CV LAB;  Service: Cardiovascular;  Laterality: N/A;  . KNEE ARTHROSCOPY Bilateral    "2 on left; 1 on the right"  . LEFT HEART CATH AND CORONARY ANGIOGRAPHY N/A 11/02/2019   Procedure: LEFT HEART CATH AND CORONARY ANGIOGRAPHY;  Surgeon: Wellington Hampshire, MD;  Location: [place] CV LAB;  Service: Cardiovascular;  Laterality: N/A;  . LEFT HEART CATH AND CORONARY ANGIOGRAPHY N/A 07/22/2020   Procedure: LEFT HEART CATH AND CORONARY ANGIOGRAPHY;  Surgeon: Leonie Man, MD;  Location: [place] CV LAB;  Service: Cardiovascular;  Laterality: N/A;  . PERIPHERAL VASCULAR INTERVENTION Left 10/11/2020   popliteal and SFA stent placement   . PERIPHERAL VASCULAR INTERVENTION Left 10/10/2020   Procedure: PERIPHERAL VASCULAR INTERVENTION;  Surgeon: Elam Dutch, MD;  Location: [place] CV LAB;  Service: Cardiovascular;  Laterality: Left;  . RIGHT/LEFT HEART CATH AND CORONARY ANGIOGRAPHY N/A 05/30/2019   Procedure: RIGHT/LEFT HEART CATH AND CORONARY ANGIOGRAPHY;  Surgeon: Belva Crome, MD;  Location: [place] CV LAB;  Service: Cardiovascular;  Laterality: N/A;  . SHOULDER OPEN ROTATOR CUFF REPAIR Bilateral   . TEE WITHOUT CARDIOVERSION N/A 07/24/2020   Procedure: TRANSESOPHAGEAL ECHOCARDIOGRAM (TEE);  Surgeon: Lajuana Matte, MD;  Location: [place] [facility];  Service: Open Heart Surgery;  Laterality: N/A;       Family History  Problem Relation Age of Onset  . Hypertension Brother   . Hypertension Father   . Diabetes Other   . Hyperlipidemia Other     Social History   Tobacco Use  . Smoking status: Former Smoker    Packs/day: 1.00    Years: 34.00    Pack years: 34.00    Types: Cigarettes    Quit date: 07/12/2020    Years since quitting: 0.6  . Smokeless tobacco: Former Systems developer    Types: Snuff, Chew  Vaping Use  . Vaping Use: Never used   Substance Use Topics  . Alcohol use: Yes    Comment: RARE  . Drug use: No    Home Medications Prior to Admission medications   Medication Sig Start Date End Date Taking? Authorizing Provider  albuterol (VENTOLIN HFA) 108 (90 Base) MCG/ACT inhaler Inhale 2 puffs into the lungs every 6 (six) hours as needed for wheezing or shortness of breath.    [provider]  ALPRAZolam Duanne Moron) 1 MG tablet Take 1 mg by mouth See admin instructions. Take 1 mg as needed daily anxiety  and 1 mg at bedtime for sleep    [provider]  aspirin EC 81 MG tablet Take 81 mg by mouth daily. Swallow whole.    [provider]  atorvastatin (LIPITOR) 80 MG tablet Take 1 tablet (80 mg total) by mouth daily at 6 PM. 05/31/19   Sande Rives E, PA-C  blood glucose meter kit and supplies KIT Dispense based on patient and insurance preference. Use up to four times daily as directed. (FOR ICD-9 250.00, 250.01). 09/23/17   Hongalgi, Lenis Dickinson, MD  buPROPion (WELLBUTRIN SR) 150 MG 12 hr tablet Take 150 mg by mouth 2 (two) times daily.  08/17/16   [provider]  carvedilol (COREG) 3.125 MG tablet TAKE 1 TABLET(3.125 MG) BY MOUTH TWICE DAILY Patient taking differently: Take 3.125 mg by mouth 2 (two) times daily with a meal. 09/01/20   Elgie Collard, PA-C  clopidogrel (PLAVIX) 75 MG tablet Take 1 tablet (75 mg total) by mouth daily with breakfast. 10/11/20   Ulyses Amor, PA-C  doxycycline (VIBRA-TABS) 100 MG tablet Take 1 tablet (100 mg total) by mouth 2 (two) times daily. Patient taking differently: Take 100 mg by mouth 2 (two) times daily. continuous 10/02/20   Newt Minion, MD  escitalopram (LEXAPRO) 10 MG tablet Take 10 mg by mouth daily. 06/10/20   [provider]  fenofibrate (TRICOR) 145 MG tablet Take 145 mg by mouth daily.    [provider]  fluconazole (DIFLUCAN) 100 MG tablet Take 100 mg by mouth every Monday.    [provider]  fluconazole  (DIFLUCAN) 150 MG tablet Take 150 mg by mouth once a week. 02/05/21   [provider]  fluticasone (FLONASE) 50 MCG/ACT nasal spray Place 2 sprays into both nostrils daily as needed for allergies. 03/14/19   [provider]  furosemide (LASIX) 40 MG tablet Take 1 tablet (40 mg total) by mouth daily. 11/12/20 02/10/21  Skeet Latch, MD  HUMALOG KWIKPEN 100 UNIT/ML SOPN Inject 15-40 Units into the skin 3 (three) times daily with meals. sliding scale 05/02/13  [provider]  hydrochlorothiazide (MICROZIDE) 12.5 MG capsule TAKE 1 CAPSULE(12.5 MG) BY MOUTH DAILY 12/11/20   Isaiah Serge, NP  LANTUS SOLOSTAR 100 UNIT/ML Solostar Pen Inject 40 Units into the skin 2 (two) times daily. 07/31/20   Elgie Collard, PA-C  meclizine (ANTIVERT) 25 MG tablet Take 25-50 mg by mouth See admin instructions. Take 50 mg by mouth in the morning & take  25 mg by mouth at night. 04/10/13   [provider]  metFORMIN (GLUCOPHAGE) 1000 MG tablet Take 1 tablet (1,000 mg total) by mouth 2 (two) times daily with a meal. 11/05/19   Isaiah Serge, NP  oxyCODONE (ROXICODONE) 15 MG immediate release tablet Take 15 mg by mouth 5 (five) times daily.     [provider]  pantoprazole (PROTONIX) 40 MG tablet Take 1 tablet (40 mg total) by mouth daily. Patient taking differently: Take 80 mg by mouth daily. 09/23/17   Hongalgi, Lenis Dickinson, MD  potassium chloride SA (KLOR-CON) 20 MEQ tablet TAKE 2 BY MOUTH DAILY 11/20/20   Skeet Latch, MD  pregabalin (LYRICA) 150 MG capsule Take 150 mg by mouth 3 (three) times daily.  05/06/19   [provider]  promethazine (PHENERGAN) 25 MG tablet Take 25 mg by mouth. 4-6 hrs 12/25/20   [provider]  VICTOZA 18 MG/3ML SOPN Inject 1.2 mg into the skin at bedtime. 04/30/19   [provider]    Allergies    Sulfa antibiotics  Review of Systems   Review of Systems  Constitutional: Negative for chills, diaphoresis and fever.   Respiratory: Negative for cough and shortness of breath.   Cardiovascular: Positive for leg swelling. Negative for chest pain.  Gastrointestinal: Negative for abdominal pain, diarrhea, nausea and vomiting.  Skin: Positive for color change and wound.  Neurological: Negative for weakness and numbness.  All other systems reviewed and are negative.   Physical Exam Updated Vital Signs BP 102/65 (BP Location: Right Arm)   Pulse 92   Temp 99 F (37.2 C) (Oral)   Resp 16   SpO2 99%   Physical Exam Vitals and nursing note reviewed.  Constitutional:      General: He is not in acute distress.    Appearance: He is well-developed. He is not diaphoretic.  HENT:     Head: Normocephalic and atraumatic.     Mouth/Throat:     Mouth: Mucous membranes are moist.     Pharynx: Oropharynx is clear.  Eyes:     Conjunctiva/sclera: Conjunctivae normal.  Cardiovascular:     Rate and Rhythm: Normal rate and regular rhythm.     Pulses: Normal pulses.          Radial pulses are 2+ on the right side and 2+ on the left side.       Dorsalis pedis pulses are 2+ on the right side and 2+ on the left side.     Heart sounds: Normal heart sounds.  Pulmonary:     Effort: Pulmonary effort is normal. No respiratory distress.     Breath sounds: Normal breath sounds.  Abdominal:     Palpations: Abdomen is soft.     Tenderness: There is no abdominal tenderness. There is no guarding.  Musculoskeletal:     Cervical back: Neck supple.     Right lower leg: 2+ Pitting Edema present.     Left lower leg: Edema present.     Comments: Erythema, swelling, increased warmth, tenderness to the left lower leg,  as shown in the photo.  Compartments remain soft.  No noted subcutaneous emphysema or pain out of proportion.  Lymphadenopathy:     Cervical: No cervical adenopathy.  Skin:    General: Skin is warm and dry.  Neurological:     Mental Status: He is alert.     Comments: Patient endorses neuropathy at baseline in the  lower extremities therefore sensation light touch is reduced at baseline. Appropriate motor function is intact in the bilateral lower extremities.  Psychiatric:        Mood and Affect: Mood and affect normal.        Speech: Speech normal.        Behavior: Behavior normal.           ED Results / Procedures / Treatments   Labs (all labs ordered are listed, but only abnormal results are displayed) Labs Reviewed  COMPREHENSIVE METABOLIC PANEL - Abnormal; Notable for the following components:      Result Value   Glucose, Bld 421 (*)    BUN 32 (*)    Creatinine, Ser 1.80 (*)    Albumin 2.6 (*)    GFR, Estimated 46 (*)    All other components within normal limits  CBC WITH DIFFERENTIAL/PLATELET - Abnormal; Notable for the following components:   Hemoglobin 11.1 (*)    HCT 36.3 (*)    MCH 25.2 (*)    Abs Immature Granulocytes 0.10 (*)    All other components within normal limits  CULTURE, BLOOD (ROUTINE X 2)  SARS CORONAVIRUS 2 (TAT 6-24 HRS)  LACTIC ACID, PLASMA  URINALYSIS, ROUTINE W REFLEX MICROSCOPIC    BUN  Date Value Ref Range Status  02/18/2021 32 (H) 6 - 20 mg/dL Final  11/20/2020 30 (H) 6 - 24 mg/dL Final  10/11/2020 22 (H) 6 - 20 mg/dL Final  10/10/2020 29 (H) 6 - 20 mg/dL Final  08/13/2020 31 (H) 6 - 20 mg/dL Final  05/25/2019 18 6 - 24 mg/dL Final   Creat  Date Value Ref Range Status  09/29/2016 1.21 0.60 - 1.35 mg/dL Final   Creatinine, Ser  Date Value Ref Range Status  02/18/2021 1.80 (H) 0.61 - 1.24 mg/dL Final  11/20/2020 1.34 (H) 0.76 - 1.27 mg/dL Final  10/11/2020 1.35 (H) 0.61 - 1.24 mg/dL Final  10/10/2020 1.20 0.61 - 1.24 mg/dL Final     EKG EKG Interpretation  Date/Time:  Wednesday February 18 2021 16:10:17 EST Ventricular Rate:  88 PR Interval:    QRS Duration: 128 QT Interval:  375 QTC Calculation: 454 R Axis:   -51 Text Interpretation: Sinus rhythm Left bundle branch block poor r wave progression anteriorly, more than previous. no  Stemi. otherwise similar to previous Confirmed by Charlesetta Shanks [phone]) on 02/18/2021 5:54:44 PM   Radiology DG Chest 2 View  Result Date: 02/18/2021 CLINICAL DATA:  Shortness of breath. EXAM: CHEST - 2 VIEW COMPARISON:  09/12/2020 FINDINGS: The heart size and mediastinal contours are stable and within normal limits after prior CABG. There is no evidence of pulmonary edema, consolidation, pneumothorax, nodule or pleural fluid. The visualized skeletal structures are unremarkable. IMPRESSION: No active cardiopulmonary disease. Electronically Signed   By: Aletta Edouard M.D.   On: 02/18/2021 16:21   DG Tibia/Fibula Left  Result Date: 02/18/2021 CLINICAL DATA:  Left lower leg cellulitis. EXAM: LEFT TIBIA AND FIBULA - 2 VIEW COMPARISON:  None. FINDINGS: No fracture or bony destruction identified. Left popliteal arterial stent present. Moderate osteoarthritis of the knee. No  soft tissue abnormalities. IMPRESSION: No acute findings. Moderate osteoarthritis of the knee. Electronically Signed   By: Aletta Edouard M.D.   On: 02/18/2021 16:28   DG Foot Complete Left  Result Date: 02/18/2021 CLINICAL DATA:  Cellulitis of left lower leg and foot. EXAM: LEFT FOOT - COMPLETE 3+ VIEW COMPARISON:  08/07/2020 FINDINGS: Stable appearance status post prior amputation of the left fourth toe at the level of the mid metatarsal. No evidence of acute fracture, bony destruction or visible soft tissue abnormality. No soft tissue foreign body. IMPRESSION: Stable appearance status post prior amputation of the left fourth toe at the level of the mid metatarsal. Electronically Signed   By: Aletta Edouard M.D.   On: 02/18/2021 16:26   VAS [place] LOWER EXTREMITY VENOUS (DVT) 7a-7p  Result Date: 02/18/2021  Lower Venous DVT Study Indications: Pain, and Swelling.  Risk Factors: DVT hx of. Limitations: Body habitus. Comparison Study: prev 7/21 Negative Performing Technologist: Vonzell Schlatter RVT  Examination Guidelines: A complete evaluation  includes B-mode imaging, spectral Doppler, color Doppler, and power Doppler as needed of all accessible portions of each vessel. Bilateral testing is considered an integral part of a complete examination. Limited examinations for reoccurring indications may be performed as noted. The reflux portion of the exam is performed with the patient in reverse Trendelenburg.  +---------+---------------+---------+-----------+----------+--------------+ RIGHT    CompressibilityPhasicitySpontaneityPropertiesThrombus Aging +---------+---------------+---------+-----------+----------+--------------+ CFV      Full           Yes      Yes                                 +---------+---------------+---------+-----------+----------+--------------+ SFJ      Full                                                        +---------+---------------+---------+-----------+----------+--------------+ FV Prox  Full                                                        +---------+---------------+---------+-----------+----------+--------------+ FV Mid   Full                                                        +---------+---------------+---------+-----------+----------+--------------+ FV DistalFull                                                        +---------+---------------+---------+-----------+----------+--------------+ PFV      Full                                                        +---------+---------------+---------+-----------+----------+--------------+ POP  Full           Yes      Yes                                 +---------+---------------+---------+-----------+----------+--------------+ PTV      Full                                                        +---------+---------------+---------+-----------+----------+--------------+ PERO     Full                                                         +---------+---------------+---------+-----------+----------+--------------+   +---------+---------------+---------+-----------+----------+--------------+ LEFT     CompressibilityPhasicitySpontaneityPropertiesThrombus Aging +---------+---------------+---------+-----------+----------+--------------+ CFV      Full           Yes      Yes                                 +---------+---------------+---------+-----------+----------+--------------+ SFJ      Full                                                        +---------+---------------+---------+-----------+----------+--------------+ FV Prox  Full                                                        +---------+---------------+---------+-----------+----------+--------------+ FV Mid   Full                                                        +---------+---------------+---------+-----------+----------+--------------+ FV DistalFull                                                        +---------+---------------+---------+-----------+----------+--------------+ PFV      Full                                                        +---------+---------------+---------+-----------+----------+--------------+ POP      Full           Yes      Yes                                 +---------+---------------+---------+-----------+----------+--------------+  PTV                                                   Not visualized +---------+---------------+---------+-----------+----------+--------------+ PERO                                                  Not visualized +---------+---------------+---------+-----------+----------+--------------+  Summary: RIGHT: - There is no evidence of deep vein thrombosis in the lower extremity.  - No cystic structure found in the popliteal fossa.  LEFT: - There is no evidence of deep vein thrombosis in the lower extremity. However, portions of this examination were limited- see  technologist comments above.  - No cystic structure found in the popliteal fossa.  *See table(s) above for measurements and observations.    Preliminary     Procedures Procedures   Medications Ordered in ED Medications  lactated ringers bolus 1,000 mL (has no administration in time range)  ceFAZolin (ANCEF) IVPB 2g/100 mL premix (0 g Intravenous Stopped 02/18/21 1713)  lactated ringers bolus 500 mL ( Intravenous Rate/Dose Change 02/18/21 1747)    ED Course  I have reviewed the triage vital signs and the nursing notes.  Pertinent labs & imaging results that were available during my care of the patient were reviewed by me and considered in my medical decision making (see chart for details).  Clinical Course as of 02/18/21 1812  Wed Feb 18, 2021  1756 BP(!): 91/56 Repeat 102/70. [SJ]  59 Spoke with internal medicine resident for admission of the patient. [SJ]    Clinical Course User Index [SJ] Rontae Inglett, Helane Gunther, PA-C   MDM Rules/Calculators/A&P                          Patient presents with what appears to be cellulitis to the left lower extremity with apparent failure of outpatient therapy. Patient is nontoxic appearing, afebrile, not tachycardic, not tachypneic, maintains excellent SPO2 on room air, and is in no apparent distress.  Some hypotension was noted, treated with IV fluids.  I have reviewed the patient's chart to obtain more information.  Echo on January 13, 2021 shows LVEF 50 to 55%.  I reviewed and interpreted the patient's labs and radiological studies. No leukocytosis.  No lactic acidosis.  Some elevation in patient's creatinine. No acute abnormalities on x-rays and no noted DVT on lower extremity ultrasound.  Findings and plan of care discussed with attending physician, Charlesetta Shanks, MD. Dr. Johnney Killian personally evaluated and examined this patient.  Final Clinical Impression(s) / ED Diagnoses Final diagnoses:  Cellulitis of left lower extremity    Rx / DC  Orders ED Discharge Orders    None       Layla Maw 02/18/21 1812    Charlesetta Shanks, MD 02/19/21 1542

## 2021-02-18 NOTE — ED Provider Notes (Signed)
I provided a substantive portion of the care of this patient.  I personally performed the entirety of the exam for this encounter.  EKG Interpretation  Date/Time:  Wednesday February 18 2021 16:10:17 EST Ventricular Rate:  88 PR Interval:    QRS Duration: 128 QT Interval:  375 QTC Calculation: 454 R Axis:   -51 Text Interpretation: Sinus rhythm Left bundle branch block poor r wave progression anteriorly, more than previous. no Stemi. otherwise similar to previous Confirmed by Arby Barrette [id number]) on 02/18/2021 5:54:44 PM  Patient has had worsening erythema and swelling of his left lower extremity.  He sustained a traumatic wound 2 weeks ago.  He is taken almost a week of doxycycline with worsening symptoms.  Patient is alert with clear mental status.  No respiratory distress.  Airway clear.  Lungs are clear to auscultation no wheeze rhonchi rale.  Heart is regular no gross rub murmur gallop.  Abdomen is soft and nontender.  Patient has bright erythema of the left lower extremity with a scabbed wound on the pretibial surface.  I agree with plan of management repeat blood pressure when in the room at 17: 55 is 102/70.  Sepsis treatment initiated.   Arby Barrette, MD 02/18/21 [phone]

## 2021-02-18 NOTE — Hospital Course (Signed)
Started a couple of weeks ago. Thinks he rubbed it up against the emergency break of of his son's car.  Went to his PCP about a week ago. He is not sure when he started the antibiotics  Patient states that he has problems with his memory since his CABG  He does admit to chronic shortness of breath  He sometimes feels lightheaded when he stands up. He has been having chest pains since his surgery which has been worked up by his cardiologist patient denies any fevers, chills stating that he feels fine.   BP in the room is 111/74 in the room. Repeat 113/981. Baseline BP is  119/83 at a recent office visit on 02/18/21  He states that he has been having to get up to pee more frequently, states that he has been feeling thirst and his mouth is dry. No abdominal pain.   He states that he eats well.  He states that he takes all of his medications as prescribed.  He took all of his medications today including his blood pressure medications.

## 2021-02-19 DIAGNOSIS — L03116 Cellulitis of left lower limb: Secondary | ICD-10-CM | POA: Diagnosis not present

## 2021-02-19 DIAGNOSIS — I9589 Other hypotension: Secondary | ICD-10-CM | POA: Diagnosis not present

## 2021-02-19 DIAGNOSIS — E1165 Type 2 diabetes mellitus with hyperglycemia: Secondary | ICD-10-CM

## 2021-02-19 DIAGNOSIS — Z794 Long term (current) use of insulin: Secondary | ICD-10-CM

## 2021-02-19 LAB — BASIC METABOLIC PANEL
Anion gap: 8 (ref 5–15)
BUN: 32 mg/dL — ABNORMAL HIGH (ref 6–20)
CO2: 26 mmol/L (ref 22–32)
Calcium: 8.7 mg/dL — ABNORMAL LOW (ref 8.9–10.3)
Chloride: 102 mmol/L (ref 98–111)
Creatinine, Ser: 1.67 mg/dL — ABNORMAL HIGH (ref 0.61–1.24)
GFR, Estimated: 50 mL/min — ABNORMAL LOW (ref >60)
Glucose, Bld: 404 mg/dL — ABNORMAL HIGH (ref 70–99)
Potassium: 5 mmol/L (ref 3.5–5.1)
Sodium: 136 mmol/L (ref 135–145)

## 2021-02-19 LAB — CBC
HCT: 33.6 % — ABNORMAL LOW (ref 39.0–52.0)
Hemoglobin: 10.1 g/dL — ABNORMAL LOW (ref 13.0–17.0)
MCH: 24.5 pg — ABNORMAL LOW (ref 26.0–34.0)
MCHC: 30.1 g/dL (ref 30.0–36.0)
MCV: 81.4 fL (ref 80.0–100.0)
Platelets: 314 10*3/uL (ref 150–400)
RBC: 4.13 MIL/uL — ABNORMAL LOW (ref 4.22–5.81)
RDW: 15 % (ref 11.5–15.5)
WBC: 6.8 10*3/uL (ref 4.0–10.5)
nRBC: 0 % (ref 0.0–0.2)

## 2021-02-19 LAB — GLUCOSE, CAPILLARY
Glucose-Capillary: 239 mg/dL — ABNORMAL HIGH (ref 70–99)
Glucose-Capillary: 223 mg/dL — ABNORMAL HIGH (ref 70–99)
Glucose-Capillary: 271 mg/dL — ABNORMAL HIGH (ref 70–99)

## 2021-02-19 MED ORDER — CEPHALEXIN 500 MG PO CAPS
500.0000 mg | ORAL_CAPSULE | Freq: Four times a day (QID) | ORAL | Status: DC
  Administered 2021-02-19: 500 mg via ORAL
  Filled 2021-02-19: qty 1

## 2021-02-19 MED ORDER — OXYCODONE HCL 5 MG PO TABS: 15.0000 mg | ORAL_TABLET | Freq: Four times a day (QID) | ORAL | Status: DC | PRN

## 2021-02-19 MED ORDER — LIVING WELL WITH DIABETES BOOK
Freq: Once | Status: AC
  Filled 2021-02-19: qty 1

## 2021-02-19 MED ORDER — ENOXAPARIN SODIUM 80 MG/0.8ML ~~LOC~~ SOLN
80.0000 mg | SUBCUTANEOUS | Status: DC
  Filled 2021-02-19: qty 0.8

## 2021-02-19 MED ORDER — ASPIRIN EC 81 MG PO TBEC: 81.0000 mg | DELAYED_RELEASE_TABLET | Freq: Every day | ORAL | Status: DC

## 2021-02-19 MED ORDER — OXYCODONE HCL 5 MG PO TABS
5.0000 mg | ORAL_TABLET | Freq: Once | ORAL | Status: AC
  Administered 2021-02-19: 5 mg via ORAL
  Filled 2021-02-19: qty 1

## 2021-02-19 MED ORDER — DOXYCYCLINE HYCLATE 100 MG PO TABS: 100.0000 mg | ORAL_TABLET | Freq: Two times a day (BID) | ORAL | 0 refills | Status: AC

## 2021-02-19 MED ORDER — DOXYCYCLINE HYCLATE 100 MG PO TABS
100.0000 mg | ORAL_TABLET | Freq: Two times a day (BID) | ORAL | Status: DC
  Administered 2021-02-19: 100 mg via ORAL
  Filled 2021-02-19: qty 1

## 2021-02-19 MED ORDER — CEPHALEXIN 500 MG PO CAPS: 500.0000 mg | ORAL_CAPSULE | Freq: Four times a day (QID) | ORAL | 0 refills | Status: AC

## 2021-02-19 NOTE — Progress Notes (Signed)
Inpatient Diabetes Program Recommendations  AACE/ADA: New Consensus Statement on Inpatient Glycemic Control (2015)  Target Ranges:  Prepandial:   less than 140 mg/dL      Peak postprandial:   less than 180 mg/dL (1-2 hours)      Critically ill patients:  140 - 180 mg/dL   Lab Results  Component Value Date   GLUCAP 271 (H) 02/19/2021   HGBA1C 9.9 (H) 02/18/2021    Review of Glycemic Control Results for Joseph [name], Joseph [name] (MRN [id number]) as of 02/19/2021 12:33  Ref. Range 02/18/2021 22:31 02/19/2021 08:08 02/19/2021 12:30  Glucose-Capillary Latest Ref Range: 70 - 99 mg/dL 761 (H) 950 (H) 932 (H)   Diabetes history: DM2 Outpatient Diabetes medications: Lantus 40 units bid + Humalog 15-40 units tid meal coverage + Victoza 1.2 mg + Meformin 1 gm bid Current orders for Inpatient glycemic control: Lantus 40 units bid + Novolog correction 0-20 units tid   Inpatient Diabetes Program Recommendations:   -Add Novolog 10 units tid meal coverage if eats 50% and adjust as needed  Thank you, Darel Hong E. Hilding Quintanar, RN, MSN, CDE  Diabetes Coordinator Inpatient Glycemic Control Team Team Pager [phone] (8am-5pm) 02/19/2021 12:38 PM

## 2021-02-19 NOTE — Plan of Care (Signed)

## 2021-02-19 NOTE — Discharge Instructions (Signed)

## 2021-02-19 NOTE — Progress Notes (Signed)
Pt was given the AVS discharge summary and went over with him. IV was removed with catheter intact. Pt had no further questions.

## 2021-02-19 NOTE — Progress Notes (Signed)
  Date: 02/19/2021  Patient name: Joseph [name]  Medical record number: [id number]  Date of birth: [birth year]   I have seen and evaluated Joseph [name] Headings and discussed their care with the Residency Team. Briefly, Joseph [name] is a 50 year old man with significant vascular and cardiac history, DM with neuropathy of the legs and hands who presented with a wound which developed further into a non purulent cellulitis.  He was being treated at home with doxycycline and his erythema has been slowly improving.  However, his PCP was concerned that he was not improving quickly enough and recommended an ED evaluation for IV antibiotics.  He initially had lower blood pressures.  He has not had any fevers, chills or other infections.  He does note pain, 8.5/10 in the left leg due to the erythema and swelling.   Vitals:   02/19/21 0157 02/19/21 0636  BP:  135/87  Pulse: 88 79  Resp:  18  Temp: (!) 97.5 F (36.4 C) (!) 97.4 F (36.3 C)  SpO2: 94% 96%   Gen: Obese gentleman, lying in bed, appears older than stated age Eyes: Anicteric sclerae CV: RR, NR, no murmur, + LE edema on the left Pulm: Breathing comfortably, not wheezing audibly Abd: Obese, +BS MSK: He is s/p amputation of one toe on the left.  He has chronic skin changes to the legs bilaterally.  No contractures.  Skin: He has erythema up to mid calf.  He has an outlined area which the erythema has retreated from somewhat.  He has healing wounds on his toe and bottom of his foot on the left.  No streaking up his thigh of erythema.  TTP.  + edema Psych: Pleasant, no distress, alert and oriented.   Assessment and Plan: I have seen and evaluated the patient as outlined above. I agree with the formulated Assessment and Plan as detailed in the residents' note, with the following changes:   1. Non purulent cellulitis - Much improved with a dose of IV cephalosporin - Was improving, albeit slowly, with doxycycline - Will send home with a 14 day course  of Keflex and doxycycline.  - Follow up with PCP for management of chronic issues.  - Will continue to follow blood cultures.   2. AKI on CKD - Renal function stabilized around 1.67.  - Continue to encourage PO intake - Follow up with PCP once infection improves.   3. Hypotension, polypharmacy - Discontinue hctz on discharge given renal function and low BP - Would continue lasix for now, close follow up with PCP - Continue Coreg for h/o CAD and CABG.   Other issues per resident note today.  Will plan to discharge patient on oral antibiotics.   Joseph Catalina, MD 3/3/20223:13 PM

## 2021-02-19 NOTE — Discharge Summary (Signed)
Name: Joseph [name] MRN: [id number] DOB: [birth year] 50 y.o. PCP: [name], NP  Date of Admission: 02/18/2021 12:34 PM Date of Discharge: 02/19/2021 Attending Physician: Sid Falcon, MD  Discharge Diagnosis: 1. Nonpurulent cellulitis 2. AKI on CKD 3. Hypotension  Discharge Medications: Allergies as of 02/19/2021      Reactions   Sulfa Antibiotics Other (See Comments)   Headaches      Medication List    STOP taking these medications   hydrochlorothiazide 12.5 MG capsule Commonly known as: MICROZIDE   potassium chloride SA 20 MEQ tablet Commonly known as: KLOR-CON     TAKE these medications   albuterol 108 (90 Base) MCG/ACT inhaler Commonly known as: VENTOLIN HFA Inhale 2 puffs into the lungs every 6 (six) hours as needed for wheezing or shortness of breath.   ALPRAZolam 1 MG tablet Commonly known as: XANAX Take 1 mg by mouth See admin instructions. Take 1 mg by mouth at bedtime and an additional 1 mg, if needed for sleep. Take 1 mg by mouth once a day as needed for chest tightness or anxiety.   aspirin EC 81 MG tablet Take 1 tablet (81 mg total) by mouth daily. Swallow whole. What changed: Another medication with the same name was removed. Continue taking this medication, and follow the directions you see here.   atorvastatin 80 MG tablet Commonly known as: LIPITOR Take 1 tablet (80 mg total) by mouth daily at 6 PM.   blood glucose meter kit and supplies Kit Dispense based on patient and insurance preference. Use up to four times daily as directed. (FOR ICD-9 250.00, 250.01).   buPROPion 150 MG 12 hr tablet Commonly known as: WELLBUTRIN SR Take 150 mg by mouth 2 (two) times daily.   carvedilol 3.125 MG tablet Commonly known as: COREG TAKE 1 TABLET(3.125 MG) BY MOUTH TWICE DAILY What changed: See the new instructions.   cephALEXin 500 MG capsule Commonly known as: KEFLEX Take 1 capsule (500 mg total) by mouth every 6 (six) hours for 13 days.    clopidogrel 75 MG tablet Commonly known as: PLAVIX Take 1 tablet (75 mg total) by mouth daily with breakfast.   doxycycline 100 MG tablet Commonly known as: VIBRA-TABS Take 1 tablet (100 mg total) by mouth 2 (two) times daily for 13 days. What changed:   when to take this  additional instructions   escitalopram 10 MG tablet Commonly known as: LEXAPRO Take 10 mg by mouth in the morning.   fenofibrate 145 MG tablet Commonly known as: TRICOR Take 145 mg by mouth in the morning.   fluconazole 100 MG tablet Commonly known as: DIFLUCAN Take 100 mg by mouth every Monday.   fluticasone 50 MCG/ACT nasal spray Commonly known as: FLONASE Place 2 sprays into both nostrils in the morning.   FreeStyle Libre 14 Day Sensor Misc Inject 1 patch into the skin every 14 (fourteen) days.   furosemide 40 MG tablet Commonly known as: LASIX Take 1 tablet (40 mg total) by mouth daily.   HumaLOG KwikPen 100 UNIT/ML KwikPen Generic drug: insulin lispro Inject 15-40 Units into the skin See admin instructions. Inject 15-40 units into the skin 3 times a day with meals, per sliding scale   Lantus SoloStar 100 UNIT/ML Solostar Pen Generic drug: insulin glargine Inject 40 Units into the skin 2 (two) times daily. What changed: when to take this   meclizine 25 MG tablet Commonly known as: ANTIVERT Take 25-50 mg by mouth See admin instructions. Take  50 mg by mouth in the morning & 25 mg by mouth at night   metFORMIN 1000 MG tablet Commonly known as: GLUCOPHAGE Take 1 tablet (1,000 mg total) by mouth 2 (two) times daily with a meal.   oxyCODONE 15 MG immediate release tablet Commonly known as: ROXICODONE Take 15 mg by mouth 5 (five) times daily.   pantoprazole 40 MG tablet Commonly known as: PROTONIX Take 1 tablet (40 mg total) by mouth daily. What changed:   how much to take  when to take this   pregabalin 150 MG capsule Commonly known as: LYRICA Take 150-300 mg by mouth See admin  instructions. Take 300 mg by mouth in the morning and 150 mg at bedtime   Victoza 18 MG/3ML Sopn Generic drug: liraglutide Inject 1.2 mg into the skin at bedtime.       Disposition and follow-up:   Joseph [name] was discharged from [facility] in Stable condition.  At the hospital follow up visit please address:  1.  Nonpurulent cellulitis: Patient discharged with additional 13 days of doxycycline and added Keflex for total 14 days ABX given good improvement with single dose Ceftriaxone in the hospital. Please assess for improvement. AKI: Prerenal. Repeat BMP.  Hypotension: Thought to be secondary to his infection, held hydrochlorothiazide on discharge. Please assess need to resume.  2.  Labs / imaging needed at time of follow-up: BMP, CBC  3.  Pending labs/ test needing follow-up: None  Follow-up Appointments:  Follow-up Information    [name], NP Follow up.   Why: Please call to schedule a follow up appointment with your PCP in about 1 week or sooner if symptoms worsen for follow up cellulitis. Contact information: [place] [address] [phone]               Hospital Course by problem list: 1.  Nonpurulent cellulitis: This is a 50 year old male history of CAD status post CABG, unstable angina, PAD, DVT, PE, left toe amputation, HFpEF, uncontrolled diabetes type 2, hyperlipidemia, GERD, anxiety, hypertension who presented with left lower extremity cellulitis, was being treated outpatient by his PCP with doxycycline, he was felt to have poor response to p.o. antibiotics and was advised to go to the hospital for IV antibiotics.  Patient was noted to be hypotensive and there was concern that he had systemic signs of his infection.  He was noted to be afebrile, heart rate is normal, with no leukocytosis.  He was given a dose of cefazolin in the ER.  X-rays of bilateral lower extremity showed no signs of necrotizing infection  or osteomyelitis.  He had improvement with the 1 dose IV antibiotics.  Patient appeared stable the next day and was discharged to complete a 14-day course of Keflex and doxycycline.  Blood cultures were obtained and showed no growth to date x1 on day of discharge.  2.  AKI on CKD: Creatinine elevated to 1.8 on admission, improved down to 1.67 which is close to his baseline.  Encouraged p.o. intake on discharge.  3.  Hypotension: Patient noted to be hypotensive on admission, held hydrochlorothiazide during hospitalization, blood pressure did improve.  Held hydrochlorothiazide on discharge. Continued home Lasix in setting of heart failure and CAD status post CABG. Consider multiple centrally acting medications playing a role with this and memory loss which may require further workup.  Discharge Exam:   BP 135/87 (BP Location: Left Arm)   Pulse 79   Temp (!) 97.4 F (  36.3 C) (Oral)   Resp 18   Ht 6' 4"  (1.93 m)   Wt (!) 166 kg   SpO2 96%   BMI 44.55 kg/m  Discharge exam:  General: Obese, lying in bed, no acute distress Cardiac: Regular rate and rhythm, no murmurs rubs or gallops, lower extremities with L > R pitting edema <1+ Pulmonary: No wheezing, rhonchi, rales MSK: Status post amputation left 4th toe, R great toe. No tenderness of LE's  Skin: Erythema up to just below knee of LLE, improved erythema from outlined area, lesions on plantar left foot and left shin that are healing.  Pertinent Labs, Studies, and Procedures:  CBC Latest Ref Rng & Units 02/19/2021 02/18/2021 02/18/2021  WBC 4.0 - 10.5 K/uL 6.8 7.1 6.6  Hemoglobin 13.0 - 17.0 g/dL 10.1(L) 10.3(L) 11.1(L)  Hematocrit 39.0 - 52.0 % 33.6(L) 34.7(L) 36.3(L)  Platelets 150 - 400 K/uL 314 324 374   BMP Latest Ref Rng & Units 02/19/2021 02/18/2021 02/18/2021  Glucose 70 - 99 mg/dL 404(H) 358(H) 421(H)  BUN 6 - 20 mg/dL 32(H) 31(H) 32(H)  Creatinine 0.61 - 1.24 mg/dL 1.67(H) 1.66(H) 1.80(H)  BUN/Creat Ratio 9 - 20 - - -  Sodium 135 - 145  mmol/L 136 136 136  Potassium 3.5 - 5.1 mmol/L 5.0 4.6 4.7  Chloride 98 - 111 mmol/L 102 100 100  CO2 22 - 32 mmol/L 26 26 26   Calcium 8.9 - 10.3 mg/dL 8.7(L) 8.7(L) 8.9   Left foot x-ray IMPRESSION: Stable appearance status post prior amputation of the left fourth toe at the level of the mid metatarsal.  Left tibia/fibula x-ray: IMPRESSION: No acute findings. Moderate osteoarthritis of the knee.  Discharge Instructions: Discharge Instructions    (HEART FAILURE PATIENTS) Call MD:  Anytime you have any of the following symptoms: 1) 3 pound weight gain in 24 hours or 5 pounds in 1 week 2) shortness of breath, with or without a dry hacking cough 3) swelling in the hands, feet or stomach 4) if you have to sleep on extra pillows at night in order to breathe.   Complete by: As directed    Call MD for:  difficulty breathing, headache or visual disturbances   Complete by: As directed    Call MD for:  extreme fatigue   Complete by: As directed    Call MD for:  persistant dizziness or light-headedness   Complete by: As directed    Call MD for:  persistant nausea and vomiting   Complete by: As directed    Call MD for:  severe uncontrolled pain   Complete by: As directed    Call MD for:  temperature >100.4   Complete by: As directed    Diet - low sodium heart healthy   Complete by: As directed    Diet Carb Modified   Complete by: As directed    Discharge instructions   Complete by: As directed    Joseph [name],   You were admitted to the hospital for persistent left leg cellulitis with low blood pressures. Your blood sugar was found to be very high (421) which likely contributed to your low blood pressures in addition to low oral intake. Based on discussions with your wife and the markings on your leg from your previous PCP visit, your cellulitis does appear to be slowly improving.   It is not unusual for cellulitis to get worse before getting better. You did respond very well to IV  Cefazolin in the ED, so we have  restarted you on Doxycycline but also added Keflex to hopefully hasten your healing.   It will be important to try to get good control of your diabetes (blood sugars) over the coming weeks especially to help your leg heal. Please be sure to clean your wounds regularly and keep them covered with well-fitted shoes.   Please START taking Doxycycline 1 tablet in the morning and 1 tablet in the evening and Keflex four times daily until you finish your pills on 03/04/21.  Please STOP taking Hydrochlorothiazide if you are taking this to avoid low blood pressures and stop your potassium pill. Continue taking baby ASA 79m daily.   Please follow up with your PCP in about 1 week to monitor the process of your leg healing. If you notice worsening redness, swelling, develop fevers or chills, nausea, vomiting, severe weakness/fatigue or near syncope, please call your PCP or return to the ED for evaluation.   Thank you and take care,  Dr. SKonrad Penta  Increase activity slowly   Complete by: As directed       Signed: SJeralyn Bennett MD 02/19/2021, 2:24 PM   Pager: [phone]

## 2021-02-23 LAB — CULTURE, BLOOD (ROUTINE X 2): Culture: NO GROWTH

## 2021-03-04 ENCOUNTER — Inpatient Hospital Stay (HOSPITAL_COMMUNITY)
Admission: EM | Admit: 2021-03-04 | Discharge: 2021-03-14 | DRG: 300 | Disposition: A | Discharge status: Home / Self Care | Payer: Medicare HMO | Attending: Internal Medicine | Admitting: Internal Medicine

## 2021-03-04 ENCOUNTER — Emergency Department (HOSPITAL_COMMUNITY): Payer: Medicare HMO

## 2021-03-04 ENCOUNTER — Encounter (HOSPITAL_COMMUNITY): Payer: Self-pay | Admitting: Emergency Medicine

## 2021-03-04 ENCOUNTER — Other Ambulatory Visit: Payer: Self-pay

## 2021-03-04 DIAGNOSIS — Z89411 Acquired absence of right great toe: Secondary | ICD-10-CM

## 2021-03-04 DIAGNOSIS — L97529 Non-pressure chronic ulcer of other part of left foot with unspecified severity: Secondary | ICD-10-CM | POA: Diagnosis present

## 2021-03-04 DIAGNOSIS — Z951 Presence of aortocoronary bypass graft: Secondary | ICD-10-CM

## 2021-03-04 DIAGNOSIS — Z89422 Acquired absence of other left toe(s): Secondary | ICD-10-CM

## 2021-03-04 DIAGNOSIS — Z955 Presence of coronary angioplasty implant and graft: Secondary | ICD-10-CM

## 2021-03-04 DIAGNOSIS — I5032 Chronic diastolic (congestive) heart failure: Secondary | ICD-10-CM | POA: Diagnosis present

## 2021-03-04 DIAGNOSIS — E785 Hyperlipidemia, unspecified: Secondary | ICD-10-CM | POA: Diagnosis present

## 2021-03-04 DIAGNOSIS — I255 Ischemic cardiomyopathy: Secondary | ICD-10-CM | POA: Diagnosis present

## 2021-03-04 DIAGNOSIS — L0390 Cellulitis, unspecified: Secondary | ICD-10-CM | POA: Diagnosis present

## 2021-03-04 DIAGNOSIS — R262 Difficulty in walking, not elsewhere classified: Secondary | ICD-10-CM | POA: Diagnosis present

## 2021-03-04 DIAGNOSIS — F32A Depression, unspecified: Secondary | ICD-10-CM | POA: Diagnosis present

## 2021-03-04 DIAGNOSIS — I70202 Unspecified atherosclerosis of native arteries of extremities, left leg: Secondary | ICD-10-CM | POA: Diagnosis present

## 2021-03-04 DIAGNOSIS — I252 Old myocardial infarction: Secondary | ICD-10-CM

## 2021-03-04 DIAGNOSIS — K219 Gastro-esophageal reflux disease without esophagitis: Secondary | ICD-10-CM | POA: Diagnosis present

## 2021-03-04 DIAGNOSIS — I130 Hypertensive heart and chronic kidney disease with heart failure and stage 1 through stage 4 chronic kidney disease, or unspecified chronic kidney disease: Secondary | ICD-10-CM | POA: Diagnosis present

## 2021-03-04 DIAGNOSIS — L03116 Cellulitis of left lower limb: Secondary | ICD-10-CM | POA: Diagnosis not present

## 2021-03-04 DIAGNOSIS — E1122 Type 2 diabetes mellitus with diabetic chronic kidney disease: Secondary | ICD-10-CM | POA: Diagnosis present

## 2021-03-04 DIAGNOSIS — E1165 Type 2 diabetes mellitus with hyperglycemia: Secondary | ICD-10-CM | POA: Diagnosis present

## 2021-03-04 DIAGNOSIS — E1151 Type 2 diabetes mellitus with diabetic peripheral angiopathy without gangrene: Principal | ICD-10-CM | POA: Diagnosis present

## 2021-03-04 DIAGNOSIS — E875 Hyperkalemia: Secondary | ICD-10-CM | POA: Diagnosis present

## 2021-03-04 DIAGNOSIS — G8929 Other chronic pain: Secondary | ICD-10-CM | POA: Diagnosis present

## 2021-03-04 DIAGNOSIS — Z882 Allergy status to sulfonamides status: Secondary | ICD-10-CM

## 2021-03-04 DIAGNOSIS — Z20822 Contact with and (suspected) exposure to covid-19: Secondary | ICD-10-CM | POA: Diagnosis present

## 2021-03-04 DIAGNOSIS — I2510 Atherosclerotic heart disease of native coronary artery without angina pectoris: Secondary | ICD-10-CM

## 2021-03-04 DIAGNOSIS — E11621 Type 2 diabetes mellitus with foot ulcer: Secondary | ICD-10-CM | POA: Diagnosis present

## 2021-03-04 DIAGNOSIS — N182 Chronic kidney disease, stage 2 (mild): Secondary | ICD-10-CM | POA: Diagnosis present

## 2021-03-04 DIAGNOSIS — L089 Local infection of the skin and subcutaneous tissue, unspecified: Secondary | ICD-10-CM

## 2021-03-04 DIAGNOSIS — Z6841 Body Mass Index (BMI) 40.0 and over, adult: Secondary | ICD-10-CM

## 2021-03-04 DIAGNOSIS — Z794 Long term (current) use of insulin: Secondary | ICD-10-CM

## 2021-03-04 DIAGNOSIS — E538 Deficiency of other specified B group vitamins: Secondary | ICD-10-CM | POA: Diagnosis present

## 2021-03-04 DIAGNOSIS — Z833 Family history of diabetes mellitus: Secondary | ICD-10-CM

## 2021-03-04 DIAGNOSIS — E119 Type 2 diabetes mellitus without complications: Secondary | ICD-10-CM

## 2021-03-04 DIAGNOSIS — Z79899 Other long term (current) drug therapy: Secondary | ICD-10-CM

## 2021-03-04 DIAGNOSIS — I739 Peripheral vascular disease, unspecified: Secondary | ICD-10-CM | POA: Diagnosis present

## 2021-03-04 DIAGNOSIS — G4733 Obstructive sleep apnea (adult) (pediatric): Secondary | ICD-10-CM | POA: Diagnosis present

## 2021-03-04 DIAGNOSIS — N179 Acute kidney failure, unspecified: Secondary | ICD-10-CM | POA: Diagnosis present

## 2021-03-04 DIAGNOSIS — Z86718 Personal history of other venous thrombosis and embolism: Secondary | ICD-10-CM

## 2021-03-04 DIAGNOSIS — Z87891 Personal history of nicotine dependence: Secondary | ICD-10-CM

## 2021-03-04 DIAGNOSIS — L539 Erythematous condition, unspecified: Secondary | ICD-10-CM | POA: Diagnosis present

## 2021-03-04 DIAGNOSIS — Z7902 Long term (current) use of antithrombotics/antiplatelets: Secondary | ICD-10-CM

## 2021-03-04 DIAGNOSIS — E11628 Type 2 diabetes mellitus with other skin complications: Secondary | ICD-10-CM

## 2021-03-04 DIAGNOSIS — Z7901 Long term (current) use of anticoagulants: Secondary | ICD-10-CM

## 2021-03-04 DIAGNOSIS — Z7984 Long term (current) use of oral hypoglycemic drugs: Secondary | ICD-10-CM

## 2021-03-04 DIAGNOSIS — Z7982 Long term (current) use of aspirin: Secondary | ICD-10-CM

## 2021-03-04 DIAGNOSIS — Z86711 Personal history of pulmonary embolism: Secondary | ICD-10-CM

## 2021-03-04 DIAGNOSIS — E1142 Type 2 diabetes mellitus with diabetic polyneuropathy: Secondary | ICD-10-CM | POA: Diagnosis present

## 2021-03-04 DIAGNOSIS — I878 Other specified disorders of veins: Secondary | ICD-10-CM | POA: Diagnosis present

## 2021-03-04 DIAGNOSIS — Z8249 Family history of ischemic heart disease and other diseases of the circulatory system: Secondary | ICD-10-CM

## 2021-03-04 DIAGNOSIS — F419 Anxiety disorder, unspecified: Secondary | ICD-10-CM | POA: Diagnosis present

## 2021-03-04 DIAGNOSIS — J449 Chronic obstructive pulmonary disease, unspecified: Secondary | ICD-10-CM | POA: Diagnosis present

## 2021-03-04 DIAGNOSIS — Z9861 Coronary angioplasty status: Secondary | ICD-10-CM

## 2021-03-04 DIAGNOSIS — E11649 Type 2 diabetes mellitus with hypoglycemia without coma: Secondary | ICD-10-CM | POA: Diagnosis present

## 2021-03-04 LAB — CBC WITH DIFFERENTIAL/PLATELET
Abs Immature Granulocytes: 0.06 10*3/uL (ref 0.00–0.07)
Basophils Absolute: 0.1 10*3/uL (ref 0.0–0.1)
Basophils Relative: 1 %
Eosinophils Absolute: 0.2 10*3/uL (ref 0.0–0.5)
Eosinophils Relative: 3 %
HCT: 37.9 % — ABNORMAL LOW (ref 39.0–52.0)
Hemoglobin: 11.3 g/dL — ABNORMAL LOW (ref 13.0–17.0)
Immature Granulocytes: 1 %
Lymphocytes Relative: 30 %
Lymphs Abs: 2.6 10*3/uL (ref 0.7–4.0)
MCH: 24.9 pg — ABNORMAL LOW (ref 26.0–34.0)
MCHC: 29.8 g/dL — ABNORMAL LOW (ref 30.0–36.0)
MCV: 83.5 fL (ref 80.0–100.0)
Monocytes Absolute: 0.8 10*3/uL (ref 0.1–1.0)
Monocytes Relative: 9 %
Neutro Abs: 5 10*3/uL (ref 1.7–7.7)
Neutrophils Relative %: 56 %
Platelets: 367 10*3/uL (ref 150–400)
RBC: 4.54 MIL/uL (ref 4.22–5.81)
RDW: 14.9 % (ref 11.5–15.5)
WBC: 8.7 10*3/uL (ref 4.0–10.5)
nRBC: 0 % (ref 0.0–0.2)

## 2021-03-04 LAB — COMPREHENSIVE METABOLIC PANEL
ALT: 18 U/L (ref 0–44)
AST: 18 U/L (ref 15–41)
Albumin: 3.1 g/dL — ABNORMAL LOW (ref 3.5–5.0)
Alkaline Phosphatase: 96 U/L (ref 38–126)
Anion gap: 9 (ref 5–15)
BUN: 33 mg/dL — ABNORMAL HIGH (ref 6–20)
CO2: 23 mmol/L (ref 22–32)
Calcium: 8.5 mg/dL — ABNORMAL LOW (ref 8.9–10.3)
Chloride: 103 mmol/L (ref 98–111)
Creatinine, Ser: 1.76 mg/dL — ABNORMAL HIGH (ref 0.61–1.24)
GFR, Estimated: 47 mL/min — ABNORMAL LOW (ref >60)
Glucose, Bld: 380 mg/dL — ABNORMAL HIGH (ref 70–99)
Potassium: 4.4 mmol/L (ref 3.5–5.1)
Sodium: 135 mmol/L (ref 135–145)
Total Bilirubin: 0.5 mg/dL (ref 0.3–1.2)
Total Protein: 7.4 g/dL (ref 6.5–8.1)

## 2021-03-04 LAB — LACTIC ACID, PLASMA
Lactic Acid, Venous: 2.2 mmol/L (ref 0.5–1.9)
Lactic Acid, Venous: 1.3 mmol/L (ref 0.5–1.9)

## 2021-03-04 MED ORDER — SODIUM CHLORIDE 0.9 % IV BOLUS
500.0000 mL | Freq: Once | INTRAVENOUS | Status: AC
  Administered 2021-03-05: 500 mL via INTRAVENOUS

## 2021-03-04 NOTE — ED Provider Notes (Signed)
[facility] EMERGENCY DEPARTMENT Provider Note   CSN: [id number] Arrival date & time: 03/04/21  2024     History Chief Complaint  Patient presents with   Wound Infection    Joseph [name] is a 50 y.o. male.  HPI     This is a 50 year old male with a history of coronary artery disease, DVT, hypertension, hyperlipidemia, chronic cellulitis, diabetes who presents with worsening left lower extremity edema and redness.  Patient reports that he has been dealing with cellulitis for over 1 month.  He has been on multiple courses of antibiotics.  He was treated as an outpatient by his primary physician with doxycycline and IM Rocephin.  He failed outpatient treatment and was admitted overnight on March 2.  He was discharged on doxycycline and Keflex to finish a 14-day course.  He states that the swelling and redness has gotten worse.  The wound on his anterior leg has opened up and is now weeping.  He has not had any fevers.  He states that in general his blood sugars have been "good" although he states that today he had a honey bun for breakfast.  He reports compliance with his medication including his antibiotics.  Past Medical History:  Diagnosis Date   Anxiety    Arthritis    "knees, shoulders, hips, ankles" (07/29/2016)   Asthma    CAD (coronary artery disease)    a. 2017: s/p BMS to distal Cx; b. LHC 05/30/2019: 80% mid, distal RCA s/p DES, 30% narrowing of d LM, widely patent LAD w/ luminal irregularities, widely patent stent in [place]  w/ 90+% stenosis distal to stent beofre small trifurcating obtuse marginal (potentially area of restenosis)   Chronic bronchitis (HCC)    Chronic ulcer of right great toe ([place]) 09/19/2017   COPD (chronic obstructive pulmonary disease) (HCC)    Depression    DVT (deep venous thrombosis) (HCC)    GERD (gastroesophageal reflux disease)    Hyperlipidemia    Hypertension    Myocardial infarction ([place]) 1996   "light one"    PE (pulmonary embolism) 04/2013   On chronic Xarelto   Peripheral nerve disease    Pneumonia "several times"   Sleep apnea    Type II diabetes mellitus ([place])     Patient Active Problem List   Diagnosis Date Noted   Cellulitis 02/18/2021   PAD (peripheral artery disease) ([place]) 10/10/2020   Osteomyelitis of fourth toe of right foot (HCC)    S/P CABG x 4 07/24/2020   Non-ST elevation (NSTEMI) myocardial infarction ([place]) 07/22/2020   Acute on chronic diastolic heart failure ([place]) 07/22/2020   Hypotension 07/18/2020   Left leg swelling 07/18/2020   Elevated troponin 07/18/2020   AKI (acute kidney injury) ([place]) 07/18/2020   Elevated brain natriuretic peptide (BNP) level 07/18/2020   GERD (gastroesophageal reflux disease) 07/18/2020   Severe Sepsis secondary to Lt leg/Foot Cellulitis 07/18/2020   Severe sepsis --Severe Sepsis secondary to Lt leg/Foot Cellulitis with hypotension and AKI 07/18/2020   Unstable angina ([place]) 11/01/2019   Edema 06/13/2019   Leukocytosis 05/31/2019   Smoker 03/08/2019   Chronic pain 03/08/2019   Chronic back pain 02/16/2019   Deep venous thrombosis ([place]) 02/16/2019   Obesity, Class III, BMI 40-49.9 (morbid obesity) ([place]) 02/16/2019   Peripheral nerve disease 02/16/2019   COPD (chronic obstructive pulmonary disease) ([place]) 01/22/2019   Anxiety disorder 03/26/2018   Open wound of toe 10/12/2017   Sinusitis 10/12/2017   Abdominal pain  Loss of weight    Diabetic foot infection ([place]) 09/16/2017   Uncontrolled type 2 diabetes mellitus with hyperglycemia, with long-term current use of insulin ([place]) 09/16/2017   Cellulitis of right foot    Chronic ulcer of great toe of right foot ([place]) 09/09/2017   Recurrent chest pain 07/29/2016   Hyperglycemia    Insulin dependent type 2 diabetes mellitus ([place]) 04/29/2015   OSA (obstructive sleep apnea) 05/08/2013   History of pulmonary embolus (PE) 04/27/2013   CAD -S/P PCI  11/02/19  09/10/2008   Dyslipidemia 09/09/2008    Past Surgical History:  Procedure Laterality Date   ABDOMINAL AORTOGRAM W/LOWER EXTREMITY N/A 10/10/2020   Procedure: ABDOMINAL AORTOGRAM W/LOWER EXTREMITY;  Surgeon: Elam Dutch, MD;  Location: [place] [facility];  Service: Cardiovascular;  Laterality: N/A;   AMPUTATION Right 09/21/2017   Procedure: RIGHT GREAT TOE AMPUTATION, POSSIBLE VAC;  Surgeon: Leandrew Koyanagi, MD;  Location: [place];  Service: Orthopedics;  Laterality: Right;   AMPUTATION Left 08/13/2020   Procedure: LEFT FOOT 4TH RAY AMPUTATION;  Surgeon: Newt Minion, MD;  Location: [place];  Service: Orthopedics;  Laterality: Left;   CARDIAC CATHETERIZATION  2006   [place]   "@ [facility]; when I had my heart attack"   CARDIAC CATHETERIZATION N/A 07/29/2016   Procedure: Left Heart Cath and Coronary Angiography;  Surgeon: Lorretta Harp, MD;  Location: [place] [facility];  Service: Cardiovascular;  Laterality: N/A;   CARDIAC CATHETERIZATION N/A 07/29/2016   Procedure: Coronary Stent Intervention;  Surgeon: Lorretta Harp, MD;  Location: [place] [facility];  Service: Cardiovascular;  Laterality: N/A;   CARPAL TUNNEL RELEASE Bilateral    CORONARY ANGIOPLASTY WITH STENT PLACEMENT  07/29/2016   CORONARY ARTERY BYPASS GRAFT N/A 07/24/2020   Procedure: CORONARY ARTERY BYPASS GRAFTING (CABG), ON PUMP, TIMES FOUR, USING LEFT INTERNAL MAMMARY ARTERY AND ENDOSCOPICALLY HARVESTED RIGHT GREATER SAPHENOUS VEIN;  Surgeon: Lajuana Matte, MD;  Location: [place];  Service: Open Heart Surgery;  Laterality: N/A;  FLOW TAC   CORONARY STENT INTERVENTION N/A 05/30/2019   Procedure: CORONARY STENT INTERVENTION;  Surgeon: Belva Crome, MD;  Location: [place] [facility];  Service: Cardiovascular;  Laterality: N/A;   CORONARY STENT INTERVENTION N/A 11/02/2019   Procedure: CORONARY STENT INTERVENTION;  Surgeon: Wellington Hampshire, MD;  Location: [place] [facility];  Service:  Cardiovascular;  Laterality: N/A;   ESOPHAGOGASTRODUODENOSCOPY N/A 09/22/2017   Procedure: ESOPHAGOGASTRODUODENOSCOPY (EGD);  Surgeon: Ladene Artist, MD;  Location: [facility] ENDOSCOPY;  Service: Endoscopy;  Laterality: N/A;   INTRAVASCULAR PRESSURE WIRE/FFR STUDY N/A 07/22/2020   Procedure: INTRAVASCULAR PRESSURE WIRE/FFR STUDY;  Surgeon: Leonie Man, MD;  Location: [place] [facility];  Service: Cardiovascular;  Laterality: N/A;   KNEE ARTHROSCOPY Bilateral    "2 on left; 1 on the right"   LEFT HEART CATH AND CORONARY ANGIOGRAPHY N/A 11/02/2019   Procedure: LEFT HEART CATH AND CORONARY ANGIOGRAPHY;  Surgeon: Wellington Hampshire, MD;  Location: [place] [facility];  Service: Cardiovascular;  Laterality: N/A;   LEFT HEART CATH AND CORONARY ANGIOGRAPHY N/A 07/22/2020   Procedure: LEFT HEART CATH AND CORONARY ANGIOGRAPHY;  Surgeon: Leonie Man, MD;  Location: [place] [facility];  Service: Cardiovascular;  Laterality: N/A;   PERIPHERAL VASCULAR INTERVENTION Left 10/11/2020   popliteal and SFA stent placement    PERIPHERAL VASCULAR INTERVENTION Left 10/10/2020   Procedure: PERIPHERAL VASCULAR INTERVENTION;  Surgeon: Elam Dutch, MD;  Location: [place]  CV LAB;  Service: Cardiovascular;  Laterality: Left;   RIGHT/LEFT HEART CATH AND CORONARY ANGIOGRAPHY N/A 05/30/2019   Procedure: RIGHT/LEFT HEART CATH AND CORONARY ANGIOGRAPHY;  Surgeon: Belva Crome, MD;  Location: [place] [facility];  Service: Cardiovascular;  Laterality: N/A;   SHOULDER OPEN ROTATOR CUFF REPAIR Bilateral    TEE WITHOUT CARDIOVERSION N/A 07/24/2020   Procedure: TRANSESOPHAGEAL ECHOCARDIOGRAM (TEE);  Surgeon: Lajuana Matte, MD;  Location: [place];  Service: Open Heart Surgery;  Laterality: N/A;       Family History  Problem Relation Age of Onset   Hypertension Brother    Hypertension Father    Diabetes Other    Hyperlipidemia Other     Social History   Tobacco Use   Smoking status: Former  Smoker    Packs/day: 1.00    Years: 34.00    Pack years: 34.00    Types: Cigarettes    Quit date: 07/12/2020    Years since quitting: 0.6   Smokeless tobacco: Former Systems developer    Types: Snuff, Database administrator Use   Vaping Use: Never used  Substance Use Topics   Alcohol use: Yes    Comment: RARE   Drug use: No    Home Medications Prior to Admission medications   Medication Sig Start Date End Date Taking? Authorizing Provider  albuterol (VENTOLIN HFA) 108 (90 Base) MCG/ACT inhaler Inhale 2 puffs into the lungs every 6 (six) hours as needed for wheezing or shortness of breath.    [provider]  ALPRAZolam Duanne Moron) 1 MG tablet Take 1 mg by mouth See admin instructions. Take 1 mg by mouth at bedtime and an additional 1 mg, if needed for sleep. Take 1 mg by mouth once a day as needed for chest tightness or anxiety.    [provider]  aspirin EC 81 MG tablet Take 1 tablet (81 mg total) by mouth daily. Swallow whole. 02/19/21   Jeralyn Bennett, MD  atorvastatin (LIPITOR) 80 MG tablet Take 1 tablet (80 mg total) by mouth daily at 6 PM. 05/31/19   Sande Rives E, PA-C  blood glucose meter kit and supplies KIT Dispense based on patient and insurance preference. Use up to four times daily as directed. (FOR ICD-9 250.00, 250.01). 09/23/17   Hongalgi, Lenis Dickinson, MD  buPROPion (WELLBUTRIN SR) 150 MG 12 hr tablet Take 150 mg by mouth 2 (two) times daily.  08/17/16   [provider]  carvedilol (COREG) 3.125 MG tablet TAKE 1 TABLET(3.125 MG) BY MOUTH TWICE DAILY Patient taking differently: Take 3.125 mg by mouth 2 (two) times daily with a meal. 09/01/20   Elgie Collard, PA-C  clopidogrel (PLAVIX) 75 MG tablet Take 1 tablet (75 mg total) by mouth daily with breakfast. 10/11/20   Ulyses Amor, PA-C  Continuous Blood Gluc Sensor (FREESTYLE LIBRE 14 DAY SENSOR) MISC Inject 1 patch into the skin every 14 (fourteen) days.    [provider]  escitalopram (LEXAPRO) 10 MG  tablet Take 10 mg by mouth in the morning. 06/10/20   [provider]  fenofibrate (TRICOR) 145 MG tablet Take 145 mg by mouth in the morning.    [provider]  fluconazole (DIFLUCAN) 100 MG tablet Take 100 mg by mouth every Monday.    [provider]  fluticasone (FLONASE) 50 MCG/ACT nasal spray Place 2 sprays into both nostrils in the morning. 03/14/19   [provider]  furosemide (LASIX) 40 MG tablet Take 1 tablet (40 mg  total) by mouth daily. 11/12/20 02/10/21  Skeet Latch, MD  HUMALOG KWIKPEN 100 UNIT/ML SOPN Inject 15-40 Units into the skin See admin instructions. Inject 15-40 units into the skin 3 times a day with meals, per sliding scale 05/02/13   [provider]  LANTUS SOLOSTAR 100 UNIT/ML Solostar Pen Inject 40 Units into the skin 2 (two) times daily. Patient taking differently: Inject 40 Units into the skin in the morning and at bedtime. 07/31/20   Elgie Collard, PA-C  meclizine (ANTIVERT) 25 MG tablet Take 25-50 mg by mouth See admin instructions. Take 50 mg by mouth in the morning & 25 mg by mouth at night 04/10/13   [provider]  metFORMIN (GLUCOPHAGE) 1000 MG tablet Take 1 tablet (1,000 mg total) by mouth 2 (two) times daily with a meal. 11/05/19   Isaiah Serge, NP  oxyCODONE (ROXICODONE) 15 MG immediate release tablet Take 15 mg by mouth 5 (five) times daily.     [provider]  pantoprazole (PROTONIX) 40 MG tablet Take 1 tablet (40 mg total) by mouth daily. Patient taking differently: Take 80 mg by mouth daily before breakfast. 09/23/17   Hongalgi, Lenis Dickinson, MD  pregabalin (LYRICA) 150 MG capsule Take 150-300 mg by mouth See admin instructions. Take 300 mg by mouth in the morning and 150 mg at bedtime 05/06/19   [provider]  VICTOZA 18 MG/3ML SOPN Inject 1.2 mg into the skin at bedtime. 04/30/19   [provider]    Allergies    Sulfa antibiotics  Review of Systems   Review of Systems   Constitutional: Negative for fever.  Respiratory: Negative for shortness of breath.   Cardiovascular: Negative for chest pain.  Gastrointestinal: Negative for abdominal pain, nausea and vomiting.  Skin: Positive for color change and wound.  All other systems reviewed and are negative.   Physical Exam Updated Vital Signs BP 127/86    Pulse 85    Temp 97.8 F (36.6 C) (Oral)    Resp 16    SpO2 96%   Physical Exam Vitals and nursing note reviewed.  Constitutional:      Appearance: He is well-developed. He is obese.  HENT:     Head: Normocephalic and atraumatic.     Mouth/Throat:     Mouth: Mucous membranes are moist.  Eyes:     Pupils: Pupils are equal, round, and reactive to light.  Cardiovascular:     Rate and Rhythm: Normal rate and regular rhythm.     Heart sounds: Normal heart sounds. No murmur heard.   Pulmonary:     Effort: Pulmonary effort is normal. No respiratory distress.     Breath sounds: Normal breath sounds. No wheezing.  Abdominal:     General: Bowel sounds are normal.     Palpations: Abdomen is soft.     Tenderness: There is no abdominal tenderness. There is no rebound.  Musculoskeletal:     Cervical back: Neck supple.     Right lower leg: Edema present.     Left lower leg: Edema present.     Comments: 1+ bilateral lower extremity edema left greater than right, multiple missing digits, foot is warm, difficult to palpate DP, requested Doppler pulse Chronic wound plantar aspect left foot  Lymphadenopathy:     Cervical: No cervical adenopathy.  Skin:    General: Skin is warm and dry.     Comments: Skin left lower leg warm erythematous, anterior wound weeping, no crepitus  Neurological:  Mental Status: He is alert and oriented to person, place, and time.  Psychiatric:        Mood and Affect: Mood normal.     ED Results / Procedures / Treatments   Labs (all labs ordered are listed, but only abnormal results are displayed) Labs Reviewed  LACTIC  ACID, PLASMA - Abnormal; Notable for the following components:      Result Value   Lactic Acid, Venous 2.2 (*)    All other components within normal limits  COMPREHENSIVE METABOLIC PANEL - Abnormal; Notable for the following components:   Glucose, Bld 380 (*)    BUN 33 (*)    Creatinine, Ser 1.76 (*)    Calcium 8.5 (*)    Albumin 3.1 (*)    GFR, Estimated 47 (*)    All other components within normal limits  CBC WITH DIFFERENTIAL/PLATELET - Abnormal; Notable for the following components:   Hemoglobin 11.3 (*)    HCT 37.9 (*)    MCH 24.9 (*)    MCHC 29.8 (*)    All other components within normal limits  LACTIC ACID, PLASMA    EKG None  Radiology DG Tibia/Fibula Left  Result Date: 03/04/2021 CLINICAL DATA:  Lower left leg wound. EXAM: LEFT TIBIA AND FIBULA - 2 VIEW COMPARISON:  February 18, 2021 FINDINGS: There is no evidence of fracture or other focal bone lesions. Moderate severity medial and lateral tibiofemoral compartment space narrowing is seen. A radiopaque vascular stent is seen within the region of the left popliteal fossa. IMPRESSION: 1. Moderate severity degenerative changes of the left knee. Electronically Signed   By: Virgina Norfolk M.D.   On: 03/04/2021 23:42    Procedures Procedures   Medications Ordered in ED Medications  sodium chloride 0.9 % bolus 500 mL (has no administration in time range)    ED Course  I have reviewed the triage vital signs and the nursing notes.  Pertinent labs & imaging results that were available during my care of the patient were reviewed by me and considered in my medical decision making (see chart for details).    MDM Rules/Calculators/A&P                          Patient presents with ongoing and worsening cellulitis of the left lower extremities.  He is nontoxic and afebrile.  Labs reviewed from triage.  No leukocytosis.  Slight lactic acidosis to 2.2.  He has ongoing hyperglycemia without evidence of anion gap.  He was given a  500 cc fluid bolus.  Reports compliance with antibiotic medication.  He should be covered adequately for MRSA and MSSA.  When comparing his exam findings today to pictures in the chart from his recent admission, the anterior wound is now open and weeping and it does appear more erythematous.  Unfortunately, I do not feel that there are great outpatient p.o. options for escalation of treatment.  Patient was discussed with internal medicine resident team.  They will evaluate the patient and make decisions regarding IV antibiotics.  He does not appear septic or critically ill-appearing x-ray reviewed.  No evidence of soft tissue gas. Final Clinical Impression(s) / ED Diagnoses Final diagnoses:  Cellulitis of left leg    Rx / DC Orders ED Discharge Orders    None       Dredyn Gubbels, Barbette Hair, MD 03/05/21 0004

## 2021-03-04 NOTE — ED Triage Notes (Signed)
Pt sent for evaluation of wound to left lower leg. Redness and swelling noted, pt reports he has been treated with antibiotics without improvement.

## 2021-03-05 ENCOUNTER — Observation Stay (HOSPITAL_BASED_OUTPATIENT_CLINIC_OR_DEPARTMENT_OTHER): Payer: Medicare HMO

## 2021-03-05 ENCOUNTER — Observation Stay (HOSPITAL_COMMUNITY): Payer: Medicare HMO

## 2021-03-05 ENCOUNTER — Ambulatory Visit: Payer: Medicare HMO | Admitting: Orthopedic Surgery

## 2021-03-05 DIAGNOSIS — M7989 Other specified soft tissue disorders: Secondary | ICD-10-CM

## 2021-03-05 DIAGNOSIS — D519 Vitamin B12 deficiency anemia, unspecified: Secondary | ICD-10-CM

## 2021-03-05 DIAGNOSIS — N182 Chronic kidney disease, stage 2 (mild): Secondary | ICD-10-CM

## 2021-03-05 DIAGNOSIS — L538 Other specified erythematous conditions: Secondary | ICD-10-CM

## 2021-03-05 DIAGNOSIS — M79605 Pain in left leg: Secondary | ICD-10-CM

## 2021-03-05 DIAGNOSIS — L03116 Cellulitis of left lower limb: Secondary | ICD-10-CM

## 2021-03-05 DIAGNOSIS — I739 Peripheral vascular disease, unspecified: Secondary | ICD-10-CM

## 2021-03-05 DIAGNOSIS — E1159 Type 2 diabetes mellitus with other circulatory complications: Secondary | ICD-10-CM | POA: Diagnosis not present

## 2021-03-05 DIAGNOSIS — R079 Chest pain, unspecified: Secondary | ICD-10-CM

## 2021-03-05 DIAGNOSIS — L0390 Cellulitis, unspecified: Secondary | ICD-10-CM

## 2021-03-05 DIAGNOSIS — E11628 Type 2 diabetes mellitus with other skin complications: Secondary | ICD-10-CM | POA: Diagnosis not present

## 2021-03-05 DIAGNOSIS — I9589 Other hypotension: Secondary | ICD-10-CM

## 2021-03-05 DIAGNOSIS — N179 Acute kidney failure, unspecified: Secondary | ICD-10-CM

## 2021-03-05 LAB — COMPREHENSIVE METABOLIC PANEL
ALT: 18 U/L (ref 0–44)
AST: 18 U/L (ref 15–41)
Albumin: 2.9 g/dL — ABNORMAL LOW (ref 3.5–5.0)
Alkaline Phosphatase: 77 U/L (ref 38–126)
Anion gap: 8 (ref 5–15)
BUN: 33 mg/dL — ABNORMAL HIGH (ref 6–20)
CO2: 27 mmol/L (ref 22–32)
Calcium: 8.7 mg/dL — ABNORMAL LOW (ref 8.9–10.3)
Chloride: 102 mmol/L (ref 98–111)
Creatinine, Ser: 1.73 mg/dL — ABNORMAL HIGH (ref 0.61–1.24)
GFR, Estimated: 48 mL/min — ABNORMAL LOW (ref >60)
Glucose, Bld: 296 mg/dL — ABNORMAL HIGH (ref 70–99)
Potassium: 4.8 mmol/L (ref 3.5–5.1)
Sodium: 137 mmol/L (ref 135–145)
Total Bilirubin: 0.7 mg/dL (ref 0.3–1.2)
Total Protein: 7 g/dL (ref 6.5–8.1)

## 2021-03-05 LAB — CBC
HCT: 36.7 % — ABNORMAL LOW (ref 39.0–52.0)
Hemoglobin: 10.8 g/dL — ABNORMAL LOW (ref 13.0–17.0)
MCH: 24.5 pg — ABNORMAL LOW (ref 26.0–34.0)
MCHC: 29.4 g/dL — ABNORMAL LOW (ref 30.0–36.0)
MCV: 83.2 fL (ref 80.0–100.0)
Platelets: 357 10*3/uL (ref 150–400)
RBC: 4.41 MIL/uL (ref 4.22–5.81)
RDW: 14.7 % (ref 11.5–15.5)
WBC: 8.7 10*3/uL (ref 4.0–10.5)
nRBC: 0 % (ref 0.0–0.2)

## 2021-03-05 LAB — GLUCOSE, CAPILLARY
Glucose-Capillary: 267 mg/dL — ABNORMAL HIGH (ref 70–99)
Glucose-Capillary: 270 mg/dL — ABNORMAL HIGH (ref 70–99)
Glucose-Capillary: 249 mg/dL — ABNORMAL HIGH (ref 70–99)

## 2021-03-05 LAB — RESP PANEL BY RT-PCR (FLU A&B, COVID) ARPGX2
Influenza A by PCR: NEGATIVE
Influenza B by PCR: NEGATIVE
SARS Coronavirus 2 by RT PCR: NEGATIVE

## 2021-03-05 LAB — CBG MONITORING, ED
Glucose-Capillary: 327 mg/dL — ABNORMAL HIGH (ref 70–99)
Glucose-Capillary: 358 mg/dL — ABNORMAL HIGH (ref 70–99)
Glucose-Capillary: 429 mg/dL — ABNORMAL HIGH (ref 70–99)

## 2021-03-05 MED ORDER — PREGABALIN 150 MG PO CAPS: 150.0000 mg | ORAL_CAPSULE | ORAL | Status: DC

## 2021-03-05 MED ORDER — OXYCODONE HCL 5 MG PO TABS
15.0000 mg | ORAL_TABLET | Freq: Three times a day (TID) | ORAL | Status: DC | PRN
  Administered 2021-03-05 (×2): 15 mg via ORAL
  Filled 2021-03-05 (×2): qty 3

## 2021-03-05 MED ORDER — PROMETHAZINE HCL 25 MG PO TABS: 12.5000 mg | ORAL_TABLET | Freq: Four times a day (QID) | ORAL | Status: DC | PRN

## 2021-03-05 MED ORDER — ACETAMINOPHEN 325 MG PO TABS: 650.0000 mg | ORAL_TABLET | Freq: Four times a day (QID) | ORAL | Status: DC | PRN

## 2021-03-05 MED ORDER — MUPIROCIN CALCIUM 2 % EX CREA
TOPICAL_CREAM | Freq: Every day | CUTANEOUS | Status: DC
  Administered 2021-03-14: 1 via TOPICAL
  Filled 2021-03-05 (×2): qty 15

## 2021-03-05 MED ORDER — SODIUM CHLORIDE 0.9 % IV SOLN
2.0000 g | Freq: Three times a day (TID) | INTRAVENOUS | Status: DC
  Administered 2021-03-05 – 2021-03-07 (×6): 2 g via INTRAVENOUS
  Filled 2021-03-05 (×9): qty 2

## 2021-03-05 MED ORDER — PANTOPRAZOLE SODIUM 40 MG PO TBEC
40.0000 mg | DELAYED_RELEASE_TABLET | Freq: Every day | ORAL | Status: DC
  Administered 2021-03-05 – 2021-03-14 (×10): 40 mg via ORAL
  Filled 2021-03-05 (×10): qty 1

## 2021-03-05 MED ORDER — ALPRAZOLAM 0.5 MG PO TABS
1.0000 mg | ORAL_TABLET | Freq: Every evening | ORAL | Status: DC | PRN
  Administered 2021-03-05 – 2021-03-12 (×6): 1 mg via ORAL
  Filled 2021-03-05 (×5): qty 2
  Filled 2021-03-05: qty 4

## 2021-03-05 MED ORDER — ACETAMINOPHEN 650 MG RE SUPP: 650.0000 mg | Freq: Four times a day (QID) | RECTAL | Status: DC | PRN

## 2021-03-05 MED ORDER — FENOFIBRATE 160 MG PO TABS
160.0000 mg | ORAL_TABLET | Freq: Every day | ORAL | Status: DC
  Administered 2021-03-06 – 2021-03-14 (×9): 160 mg via ORAL
  Filled 2021-03-05 (×9): qty 1

## 2021-03-05 MED ORDER — ENOXAPARIN SODIUM 40 MG/0.4ML ~~LOC~~ SOLN
40.0000 mg | SUBCUTANEOUS | Status: DC
  Administered 2021-03-05: 40 mg via SUBCUTANEOUS
  Filled 2021-03-05: qty 0.4

## 2021-03-05 MED ORDER — VANCOMYCIN HCL 1000 MG/200ML IV SOLN
1000.0000 mg | Freq: Two times a day (BID) | INTRAVENOUS | Status: DC
  Administered 2021-03-05 – 2021-03-09 (×8): 1000 mg via INTRAVENOUS
  Filled 2021-03-05 (×8): qty 200

## 2021-03-05 MED ORDER — PREGABALIN 100 MG PO CAPS
300.0000 mg | ORAL_CAPSULE | Freq: Every day | ORAL | Status: DC
  Administered 2021-03-05 – 2021-03-14 (×10): 300 mg via ORAL
  Filled 2021-03-05 (×4): qty 3
  Filled 2021-03-05: qty 12
  Filled 2021-03-05 (×2): qty 3
  Filled 2021-03-05: qty 12
  Filled 2021-03-05 (×3): qty 3

## 2021-03-05 MED ORDER — SODIUM CHLORIDE 0.9% FLUSH
3.0000 mL | Freq: Two times a day (BID) | INTRAVENOUS | Status: DC
  Administered 2021-03-05 – 2021-03-13 (×15): 3 mL via INTRAVENOUS

## 2021-03-05 MED ORDER — INSULIN ASPART 100 UNIT/ML ~~LOC~~ SOLN
0.0000 [IU] | Freq: Every day | SUBCUTANEOUS | Status: DC
  Administered 2021-03-05: 2 [IU] via SUBCUTANEOUS
  Administered 2021-03-06: 1 [IU] via SUBCUTANEOUS

## 2021-03-05 MED ORDER — CARVEDILOL 3.125 MG PO TABS
3.1250 mg | ORAL_TABLET | Freq: Two times a day (BID) | ORAL | Status: DC
  Administered 2021-03-05 – 2021-03-06 (×4): 3.125 mg via ORAL
  Filled 2021-03-05 (×4): qty 1

## 2021-03-05 MED ORDER — CLOPIDOGREL BISULFATE 75 MG PO TABS
75.0000 mg | ORAL_TABLET | Freq: Every day | ORAL | Status: DC
  Administered 2021-03-05 – 2021-03-14 (×10): 75 mg via ORAL
  Filled 2021-03-05 (×10): qty 1

## 2021-03-05 MED ORDER — VANCOMYCIN HCL 2000 MG/400ML IV SOLN
2000.0000 mg | Freq: Once | INTRAVENOUS | Status: AC
  Administered 2021-03-05: 2000 mg via INTRAVENOUS
  Filled 2021-03-05: qty 400

## 2021-03-05 MED ORDER — ALPRAZOLAM 0.25 MG PO TABS: 1.0000 mg | ORAL_TABLET | Freq: Every day | ORAL | Status: DC

## 2021-03-05 MED ORDER — BUPROPION HCL ER (SR) 150 MG PO TB12
150.0000 mg | ORAL_TABLET | Freq: Two times a day (BID) | ORAL | Status: DC
  Administered 2021-03-05 – 2021-03-14 (×20): 150 mg via ORAL
  Filled 2021-03-05 (×22): qty 1

## 2021-03-05 MED ORDER — INSULIN ASPART 100 UNIT/ML ~~LOC~~ SOLN
0.0000 [IU] | Freq: Three times a day (TID) | SUBCUTANEOUS | Status: DC
  Administered 2021-03-06: 7 [IU] via SUBCUTANEOUS
  Administered 2021-03-06: 15 [IU] via SUBCUTANEOUS
  Administered 2021-03-06: 4 [IU] via SUBCUTANEOUS
  Administered 2021-03-07: 3 [IU] via SUBCUTANEOUS
  Administered 2021-03-07 – 2021-03-08 (×3): 7 [IU] via SUBCUTANEOUS
  Administered 2021-03-08 – 2021-03-09 (×4): 4 [IU] via SUBCUTANEOUS
  Administered 2021-03-09: 11 [IU] via SUBCUTANEOUS
  Administered 2021-03-10 – 2021-03-11 (×5): 4 [IU] via SUBCUTANEOUS
  Administered 2021-03-11 – 2021-03-12 (×2): 3 [IU] via SUBCUTANEOUS
  Administered 2021-03-12: 11 [IU] via SUBCUTANEOUS
  Administered 2021-03-12 – 2021-03-13 (×2): 3 [IU] via SUBCUTANEOUS
  Administered 2021-03-14 (×2): 4 [IU] via SUBCUTANEOUS

## 2021-03-05 MED ORDER — INSULIN GLARGINE 100 UNIT/ML ~~LOC~~ SOLN
40.0000 [IU] | Freq: Two times a day (BID) | SUBCUTANEOUS | Status: DC
  Administered 2021-03-05 – 2021-03-06 (×5): 40 [IU] via SUBCUTANEOUS
  Filled 2021-03-05 (×7): qty 0.4

## 2021-03-05 MED ORDER — SENNOSIDES-DOCUSATE SODIUM 8.6-50 MG PO TABS: 1.0000 | ORAL_TABLET | Freq: Every evening | ORAL | Status: DC | PRN

## 2021-03-05 MED ORDER — ESCITALOPRAM OXALATE 10 MG PO TABS
10.0000 mg | ORAL_TABLET | Freq: Every day | ORAL | Status: DC
  Administered 2021-03-05 – 2021-03-14 (×10): 10 mg via ORAL
  Filled 2021-03-05 (×10): qty 1

## 2021-03-05 MED ORDER — FUROSEMIDE 40 MG PO TABS
40.0000 mg | ORAL_TABLET | Freq: Every day | ORAL | Status: DC
  Administered 2021-03-06: 40 mg via ORAL
  Filled 2021-03-05 (×2): qty 1

## 2021-03-05 MED ORDER — ATORVASTATIN CALCIUM 80 MG PO TABS
80.0000 mg | ORAL_TABLET | Freq: Every day | ORAL | Status: DC
  Administered 2021-03-05 – 2021-03-14 (×10): 80 mg via ORAL
  Filled 2021-03-05 (×10): qty 1

## 2021-03-05 MED ORDER — PREGABALIN 25 MG PO CAPS
150.0000 mg | ORAL_CAPSULE | Freq: Every day | ORAL | Status: DC
  Administered 2021-03-05 – 2021-03-13 (×10): 150 mg via ORAL
  Filled 2021-03-05: qty 1
  Filled 2021-03-05 (×8): qty 2

## 2021-03-05 MED ORDER — INSULIN ASPART 100 UNIT/ML ~~LOC~~ SOLN
10.0000 [IU] | Freq: Three times a day (TID) | SUBCUTANEOUS | Status: DC
  Administered 2021-03-06: 10 [IU] via SUBCUTANEOUS

## 2021-03-05 MED ORDER — OXYCODONE HCL 5 MG PO TABS
15.0000 mg | ORAL_TABLET | Freq: Every day | ORAL | Status: DC
  Administered 2021-03-05 – 2021-03-14 (×42): 15 mg via ORAL
  Filled 2021-03-05 (×44): qty 3

## 2021-03-05 MED ORDER — INSULIN ASPART 100 UNIT/ML ~~LOC~~ SOLN
0.0000 [IU] | Freq: Three times a day (TID) | SUBCUTANEOUS | Status: DC
  Administered 2021-03-05: 11 [IU] via SUBCUTANEOUS
  Administered 2021-03-05: 20 [IU] via SUBCUTANEOUS

## 2021-03-05 MED ORDER — ASPIRIN EC 81 MG PO TBEC
81.0000 mg | DELAYED_RELEASE_TABLET | Freq: Every day | ORAL | Status: DC
  Administered 2021-03-05 – 2021-03-14 (×10): 81 mg via ORAL
  Filled 2021-03-05 (×10): qty 1

## 2021-03-05 NOTE — ED Notes (Signed)
Patient transported to MRI 

## 2021-03-05 NOTE — ED Notes (Signed)
US at bedside

## 2021-03-05 NOTE — Hospital Course (Addendum)
   OP F/U:  - Mild IDA 03/07/21 - Cards follow up for unstable angina?  - B12 injection follow up   Follow up: Hands Leg amputation  Antibiotic length?

## 2021-03-05 NOTE — Progress Notes (Signed)
VASCULAR LAB    Left lower extremity venous duplex has been performed.  See CV proc for preliminary results.   Decari Duggar, RVT 03/05/2021, 1:51 PM  

## 2021-03-05 NOTE — Consult Note (Signed)
Hospital Consult    Reason for Consult:  Left leg cellulitis Referring Physician:  Dr. Dareen Piano MRN #:  [id number]  History of Present Illness: This is a 50 y.o. male has a history of left SFA and popliteal angioplasty with stent.  This was performed last October for poorly healing left toe amputation.  The amputation is subsequently healed however he does have ulceration on the foot and remains on antibiotics.  He has been followed closely by Dr. Sharol Given.  He is on aspirin and Plavix at home as well as Lipitor.  States that over the past 1 month he has had progressive cellulitis on his left leg.  He was recently admitted earlier this month for the same problem but states that this edema and the erythema has worsened and he also has drainage.  He is diabetic.  Past Medical History:  Diagnosis Date  . Anxiety   . Arthritis    "knees, shoulders, hips, ankles" (07/29/2016)  . Asthma   . CAD (coronary artery disease)    a. 2017: s/p BMS to distal Cx; b. LHC 05/30/2019: 80% mid, distal RCA s/p DES, 30% narrowing of d LM, widely patent LAD w/ luminal irregularities, widely patent stent in [place]  w/ 90+% stenosis distal to stent beofre small trifurcating obtuse marginal (potentially area of restenosis)  . Chronic bronchitis ([place])   . Chronic ulcer of right great toe ([place]) 09/19/2017  . COPD (chronic obstructive pulmonary disease) ([place])   . Depression   . DVT (deep venous thrombosis) ([place])   . GERD (gastroesophageal reflux disease)   . Hyperlipidemia   . Hypertension   . Myocardial infarction ([place]) 1996   "light one"  . PE (pulmonary embolism) 04/2013   On chronic Xarelto  . Peripheral nerve disease   . Pneumonia "several times"  . Sleep apnea   . Type II diabetes mellitus ([place])     Past Surgical History:  Procedure Laterality Date  . ABDOMINAL AORTOGRAM W/LOWER EXTREMITY N/A 10/10/2020   Procedure: ABDOMINAL AORTOGRAM W/LOWER EXTREMITY;  Surgeon: Elam Dutch, MD;  Location: [facility];  Service: Cardiovascular;  Laterality: N/A;  . AMPUTATION Right 09/21/2017   Procedure: RIGHT GREAT TOE AMPUTATION, POSSIBLE VAC;  Surgeon: Leandrew Koyanagi, MD;  Location: [facility];  Service: Orthopedics;  Laterality: Right;  . AMPUTATION Left 08/13/2020   Procedure: LEFT FOOT 4TH RAY AMPUTATION;  Surgeon: Newt Minion, MD;  Location: [facility];  Service: Orthopedics;  Laterality: Left;  . CARDIAC CATHETERIZATION  2006  . CARDIAC CATHETERIZATION  1996   "@ [facility]; when I had my heart attack"  . CARDIAC CATHETERIZATION N/A 07/29/2016   Procedure: Left Heart Cath and Coronary Angiography;  Surgeon: Lorretta Harp, MD;  Location: [facility];  Service: Cardiovascular;  Laterality: N/A;  . CARDIAC CATHETERIZATION N/A 07/29/2016   Procedure: Coronary Stent Intervention;  Surgeon: Lorretta Harp, MD;  Location: [facility];  Service: Cardiovascular;  Laterality: N/A;  . CARPAL TUNNEL RELEASE Bilateral   . CORONARY ANGIOPLASTY WITH STENT PLACEMENT  07/29/2016  . CORONARY ARTERY BYPASS GRAFT N/A 07/24/2020   Procedure: CORONARY ARTERY BYPASS GRAFTING (CABG), ON PUMP, TIMES FOUR, USING LEFT INTERNAL MAMMARY ARTERY AND ENDOSCOPICALLY HARVESTED RIGHT GREATER SAPHENOUS VEIN;  Surgeon: Lajuana Matte, MD;  Location: [facility];  Service: Open Heart Surgery;  Laterality: N/A;  FLOW TAC  . CORONARY STENT INTERVENTION N/A 05/30/2019   Procedure: CORONARY STENT INTERVENTION;  Surgeon: Belva Crome, MD;  Location: [facility];  Service: Cardiovascular;  Laterality: N/A;  . CORONARY STENT INTERVENTION N/A 11/02/2019   Procedure: CORONARY STENT INTERVENTION;  Surgeon: Wellington Hampshire, MD;  Location: [facility];  Service: Cardiovascular;  Laterality: N/A;  . ESOPHAGOGASTRODUODENOSCOPY N/A 09/22/2017   Procedure: ESOPHAGOGASTRODUODENOSCOPY (EGD);  Surgeon: Ladene Artist, MD;  Location: [facility];  Service: Endoscopy;  Laterality: N/A;  . INTRAVASCULAR PRESSURE WIRE/FFR  STUDY N/A 07/22/2020   Procedure: INTRAVASCULAR PRESSURE WIRE/FFR STUDY;  Surgeon: Leonie Man, MD;  Location: [facility];  Service: Cardiovascular;  Laterality: N/A;  . KNEE ARTHROSCOPY Bilateral    "2 on left; 1 on the right"  . LEFT HEART CATH AND CORONARY ANGIOGRAPHY N/A 11/02/2019   Procedure: LEFT HEART CATH AND CORONARY ANGIOGRAPHY;  Surgeon: Wellington Hampshire, MD;  Location: [facility];  Service: Cardiovascular;  Laterality: N/A;  . LEFT HEART CATH AND CORONARY ANGIOGRAPHY N/A 07/22/2020   Procedure: LEFT HEART CATH AND CORONARY ANGIOGRAPHY;  Surgeon: Leonie Man, MD;  Location: [facility];  Service: Cardiovascular;  Laterality: N/A;  . PERIPHERAL VASCULAR INTERVENTION Left 10/11/2020   popliteal and SFA stent placement   . PERIPHERAL VASCULAR INTERVENTION Left 10/10/2020   Procedure: PERIPHERAL VASCULAR INTERVENTION;  Surgeon: Elam Dutch, MD;  Location: [facility];  Service: Cardiovascular;  Laterality: Left;  . RIGHT/LEFT HEART CATH AND CORONARY ANGIOGRAPHY N/A 05/30/2019   Procedure: RIGHT/LEFT HEART CATH AND CORONARY ANGIOGRAPHY;  Surgeon: Belva Crome, MD;  Location: [facility];  Service: Cardiovascular;  Laterality: N/A;  . SHOULDER OPEN ROTATOR CUFF REPAIR Bilateral   . TEE WITHOUT CARDIOVERSION N/A 07/24/2020   Procedure: TRANSESOPHAGEAL ECHOCARDIOGRAM (TEE);  Surgeon: Lajuana Matte, MD;  Location: [facility];  Service: Open Heart Surgery;  Laterality: N/A;    Allergies  Allergen Reactions  . Sulfa Antibiotics Other (See Comments)    Headaches     Prior to Admission medications   Medication Sig Start Date End Date Taking? Authorizing Provider  albuterol (VENTOLIN HFA) 108 (90 Base) MCG/ACT inhaler Inhale 2 puffs into the lungs every 6 (six) hours as needed for wheezing or shortness of breath.   Yes [provider]  ALPRAZolam Duanne Moron) 1 MG tablet Take 1 mg by mouth See admin instructions. 1 tablet by mouth  twice daily and 2 tabs at bedtime.   Yes [provider]  aspirin EC 325 MG tablet Take 325 mg by mouth daily. 02/22/21  Yes [provider]  atorvastatin (LIPITOR) 80 MG tablet Take 1 tablet (80 mg total) by mouth daily at 6 PM. 05/31/19  Yes Sande Rives E, PA-C  blood glucose meter kit and supplies KIT Dispense based on patient and insurance preference. Use up to four times daily as directed. (FOR ICD-9 250.00, 250.01). 09/23/17  Yes Hongalgi, Lenis Dickinson, MD  buPROPion (WELLBUTRIN SR) 150 MG 12 hr tablet Take 150 mg by mouth 2 (two) times daily.  08/17/16  Yes [provider]  carvedilol (COREG) 3.125 MG tablet TAKE 1 TABLET(3.125 MG) BY MOUTH TWICE DAILY Patient taking differently: Take 3.125 mg by mouth 2 (two) times daily with a meal. 09/01/20  Yes Harriet Pho, Tessa N, PA-C  clopidogrel (PLAVIX) 75 MG tablet Take 1 tablet (75 mg total) by mouth daily with breakfast. 10/11/20  Yes Ulyses Amor, PA-C  Continuous Blood Gluc Sensor (FREESTYLE LIBRE 14 DAY SENSOR) MISC Inject 1 patch into the skin every 14 (fourteen) days.  Yes [provider]  escitalopram (LEXAPRO) 10 MG tablet Take 10 mg by mouth in the morning. 06/10/20  Yes [provider]  fenofibrate (TRICOR) 145 MG tablet Take 145 mg by mouth in the morning.   Yes [provider]  fluconazole (DIFLUCAN) 150 MG tablet Take 150 mg by mouth every Monday.   Yes [provider]  fluticasone (FLONASE) 50 MCG/ACT nasal spray Place 2 sprays into both nostrils in the morning. 03/14/19  Yes [provider]  Fluticasone Propionate, Inhal, (FLOVENT DISKUS) 250 MCG/BLIST AEPB Inhale 2 puffs into the lungs in the morning and at bedtime.   Yes [provider]  furosemide (LASIX) 40 MG tablet Take 1 tablet (40 mg total) by mouth daily. 11/12/20 02/10/21 Yes Skeet Latch, MD  HUMALOG KWIKPEN 100 UNIT/ML SOPN Inject 30 Units into the skin 3 (three) times daily. 05/02/13  Yes [provider]  LANTUS SOLOSTAR 100 UNIT/ML Solostar Pen Inject 40 Units into the skin 2 (two) times daily. Patient taking differently: Inject 70 Units into the skin in the morning and at bedtime. 07/31/20  Yes Conte, Tessa N, PA-C  lisinopril (ZESTRIL) 10 MG tablet Take 10 mg by mouth daily.   Yes [provider]  meclizine (ANTIVERT) 25 MG tablet Take 25 mg by mouth 3 (three) times daily as needed for dizziness. Take 50 mg by mouth in the morning & 25 mg by mouth at night 04/10/13  Yes [provider]  metFORMIN (GLUCOPHAGE) 1000 MG tablet Take 1 tablet (1,000 mg total) by mouth 2 (two) times daily with a meal. 11/05/19  Yes Isaiah Serge, NP  midodrine (PROAMATINE) 5 MG tablet Take 5 mg by mouth 3 (three) times daily with meals.   Yes [provider]  naloxone (NARCAN) nasal spray 4 mg/0.1 mL Place 4 mg into the nose as needed (overdose).   Yes [provider]  nitroGLYCERIN (NITROSTAT) 0.4 MG SL tablet Place 0.4 mg under the tongue every 5 (five) minutes as needed for chest pain.   Yes [provider]  oxyCODONE (ROXICODONE) 15 MG immediate release tablet Take 15 mg by mouth 5 (five) times daily.    Yes [provider]  pantoprazole (PROTONIX) 40 MG tablet Take 1 tablet (40 mg total) by mouth daily. Patient taking differently: Take 80 mg by mouth daily before breakfast. 09/23/17  Yes Hongalgi, Lenis Dickinson, MD  pregabalin (LYRICA) 150 MG capsule Take 150 mg by mouth 3 (three) times daily. 05/06/19  Yes [provider]  promethazine (PHENERGAN) 25 MG tablet Take 25 mg by mouth every 4 (four) hours as needed for nausea or vomiting.   Yes [provider]  sildenafil (REVATIO) 20 MG tablet Take 20-100 mg by mouth daily as needed (ed).   Yes [provider]  VICTOZA 18 MG/3ML SOPN Inject 1.2 mg into the skin at bedtime. 04/30/19  Yes [provider]    Social History   Socioeconomic History  . Marital status:  Married    Spouse name: Not on file  . Number of children: Not on file  . Years of education: Not on file  . Highest education level: Not on file  Occupational History  . Occupation: disabled     Fish farm manager: UNEMPLOYED  Tobacco Use  . Smoking status: Former Smoker    Packs/day: 1.00    Years: 34.00    Pack years: 34.00    Types: Cigarettes    Quit date: 07/12/2020    Years  since quitting: 0.6  . Smokeless tobacco: Former Systems developer    Types: Snuff, Chew  Vaping Use  . Vaping Use: Never used  Substance and Sexual Activity  . Alcohol use: Yes    Comment: RARE  . Drug use: No  . Sexual activity: Yes  Other Topics Concern  . Not on file  Social History Narrative  . Not on file   Social Determinants of Health   Financial Resource Strain: Not on file  Food Insecurity: Not on file  Transportation Needs: Not on file  Physical Activity: Not on file  Stress: Not on file  Social Connections: Not on file  Intimate Partner Violence: Not on file     Family History  Problem Relation Age of Onset  . Hypertension Brother   . Hypertension Father   . Diabetes Other   . Hyperlipidemia Other     ROS: Cardiovascular: []  chest pain/pressure []  palpitations []  SOB lying flat []  DOE []  pain in legs while walking []  pain in legs at rest []  pain in legs at night []  non-healing ulcers []  hx of DVT [x]  swelling in legs  Pulmonary: []  productive cough []  asthma/wheezing []  home O2  Neurologic: []  weakness in []  arms []  legs []  numbness in []  arms []  legs []  hx of CVA []  mini stroke [] difficulty speaking or slurred speech []  temporary loss of vision in one eye []  dizziness  Hematologic: []  hx of cancer []  bleeding problems []  problems with blood clotting easily  Endocrine:   []  diabetes []  thyroid disease  GI []  vomiting blood []  blood in stool  GU: []  CKD/renal failure []  HD--[]  M/W/F or []  T/T/S []  burning with urination []  blood in urine  Psychiatric: []   anxiety []  depression  Musculoskeletal: []  arthritis []  joint pain  Integumentary: []  rashes [x]  ulcers  Constitutional: []  fever []  chills   Physical Examination  Vitals:   03/05/21 1432 03/05/21 1721  BP: (!) 131/92 128/85  Pulse: 88 86  Resp: 17 17  Temp: 98.4 F (36.9 C) 97.7 F (36.5 C)  SpO2: 97% 98%   Body mass index is 44.12 kg/m.  General:  nad HENT: WNL, normocephalic Pulmonary: normal non-labored breathing Cardiac: Palpable femoral pulses bilateral Abdomen:  soft, NT/ND, no masses Extremities: Significant edema with erythema of the left leg below the knee, there is punctate ulcer on the plantar surface of the left foot Well-healed to amputation right foot Musculoskeletal: no muscle wasting or atrophy  Neurologic: A&O X 3   CBC    Component Value Date/Time   WBC 8.7 03/05/2021 0230   RBC 4.41 03/05/2021 0230   HGB 10.8 (L) 03/05/2021 0230   HGB 14.9 05/25/2019 1613   HCT 36.7 (L) 03/05/2021 0230   HCT 43.9 05/25/2019 1613   PLT 357 03/05/2021 0230   PLT 285 05/25/2019 1613   MCV 83.2 03/05/2021 0230   MCV 85 05/25/2019 1613   MCH 24.5 (L) 03/05/2021 0230   MCHC 29.4 (L) 03/05/2021 0230   RDW 14.7 03/05/2021 0230   RDW 12.9 05/25/2019 1613   LYMPHSABS 2.6 03/04/2021 2035   LYMPHSABS 3.1 05/25/2019 1613   MONOABS 0.8 03/04/2021 2035   EOSABS 0.2 03/04/2021 2035   EOSABS 0.4 05/25/2019 1613   BASOSABS 0.1 03/04/2021 2035   BASOSABS 0.1 05/25/2019 1613    BMET    Component Value Date/Time   NA 137 03/05/2021 0230   NA 140 11/20/2020 1253   K 4.8 03/05/2021 0230   CL 102  03/05/2021 0230   CO2 27 03/05/2021 0230   GLUCOSE 296 (H) 03/05/2021 0230   BUN 33 (H) 03/05/2021 0230   BUN 30 (H) 11/20/2020 1253   CREATININE 1.73 (H) 03/05/2021 0230   CREATININE 1.21 09/29/2016 1134   CALCIUM 8.7 (L) 03/05/2021 0230   GFRNONAA 48 (L) 03/05/2021 0230   GFRAA 72 11/20/2020 1253    COAGS: Lab Results  Component Value Date   INR 1.5 (H)  07/24/2020   INR 1.3 (H) 07/19/2020   INR 5.85 (HH) 07/29/2016     Non-Invasive Vascular Imaging:   ABI Findings:  +---------+------------------+-----+-----------+----------+  Right  Rt Pressure (mmHg)IndexWaveform  Comment    +---------+------------------+-----+-----------+----------+  Brachial             multiphasic       +---------+------------------+-----+-----------+----------+  PTA   131        1.01 multiphasic       +---------+------------------+-----+-----------+----------+  DP    124        0.95 multiphasic       +---------+------------------+-----+-----------+----------+  Great Toe                  amputation  +---------+------------------+-----+-----------+----------+   +---------+------------------+-----+-----------+-------+  Left   Lt Pressure (mmHg)IndexWaveform  Comment  +---------+------------------+-----+-----------+-------+  Brachial 130           multiphasic      +---------+------------------+-----+-----------+-------+  PTA               multiphasic      +---------+------------------+-----+-----------+-------+  DP                multiphasic      +---------+------------------+-----+-----------+-------+  Great Toe65        0.50             +---------+------------------+-----+-----------+-------+   +-------+-----------+-----------+------------+------------+  ABI/TBIToday's ABIToday's TBIPrevious ABIPrevious TBI  +-------+-----------+-----------+------------+------------+  Right 1.01          0.81            +-------+-----------+-----------+------------+------------+  Left        0.5    0.84    0.66      +-------+-----------+-----------+------------+------------+       Right ABIs appear increased. Left TBIs appear  decreased.    Summary:  Right: Resting right ankle-brachial index is within normal range. No  evidence of significant right lower extremity arterial disease.   Left: The left toe-brachial index is abnormal.   ASSESSMENT/PLAN: This is a 50 y.o. male admitted with recurrent left lower extremity cellulitis.  He previously had a toe amputated which was slow to heal still has ulceration on the plantar aspect of the foot that does appear to be stable.  He is followed closely by Dr. Sharol Given was supposed to be seen in his office today.  I reviewed his previous imaging which demonstrates stenting of the left SFA and popliteal artery.  I cannot easily palpate pedal pulses but this may be somewhat limited by edema.  He does have multiphasic signals on the left his toe pressure is 65.  We will plan for left lower extremity arterial duplex tomorrow.  If there appears to be issues with his previous stents we can certainly consider angiography early next week.  I discussed the plan with the patient he demonstrates good understanding.  [place]andon C. Donzetta Matters, MD [facility] Office: [phone] Pager: [phone]

## 2021-03-05 NOTE — ED Notes (Signed)
Admitting at bedside 

## 2021-03-05 NOTE — H&P (Signed)
Date: 03/05/2021               Patient Name:  Joseph [name] MRN: [id number]  DOB: [birth year] Age / Sex: 50 y.o., male   PCP: Marva Panda, NP         Medical Service: Internal Medicine Teaching Service         Attending Physician: Dr. Earl Lagos, MD    First Contact: Dr. Glenford Bayley Pager: [phone]  Second Contact: Dr. Dellia Cloud Pager: [phone]       After Hours (After 5p/  First Contact Pager: [phone]  weekends / holidays): Second Contact Pager: [phone]   Chief Complaint: left lower extremity swelling and pain  History of Present Illness: [name] is a 50 year old male with PMHx of CAD s/p CABG 07/2020, unstable angina, DVT, PE, L toe amputation 07/2020, HFpEF, uncontrolled insulin-dependent diabetes mellitus, hyperlipidemia, GERD, and anxiety presenting with persistent left lower extremity pain and swelling. He notes that it feels like it is "tearing apart". He has peripheral neuropathy at baseline.   Patient was recently admitted on 3/2 for left lower extremity cellulitis after having failed outpatient therapy with doxycycline. He was treated with IV antibiotics and discharged on oral doxycycline and Keflex. He has been taking doxycycline, keflex since discharge. He reports also taking ceftriaxone every other day over the past week (4 doses total) that was prescribed by his PCP, repots last dose was 2 days ago. He notes that there has been drainage (possible bleeding) from the left leg. However, due to his memory issues, patient is unable to recall whether he has had direct trauma to the leg. He endorses a sedentary lifestyle. He endorses worsening fatigue. He notes worsening chest pain when lying down and turning sides. He reports it feels like his chest is compressing down. He attributes this to his CABG in 2021. He denies any fevers/chills, worsening dyspnea,pleuritic chest pain, nausea/vomiting, decreased appetite, worsening abdominal pain,  diarrhea/constipation.   Notes that he has missed a few doses of his anti-diabetes medications. Takes metformin 1000mg  bid, lantus 50U bid, and victoza. He has missed these today. Notes that he has had some low blood sugars intermittently but can range up to 270s.   Meds:  No outpatient medications have been marked as taking for the 03/04/21 encounter [facility] Encounter).    Allergies: Allergies as of 03/04/2021 - Review Complete 03/04/2021  Allergen Reaction Noted  . Sulfa antibiotics Other (See Comments) 10/06/2012   Past Medical History:  Diagnosis Date  . Anxiety   . Arthritis    "knees, shoulders, hips, ankles" (07/29/2016)  . Asthma   . CAD (coronary artery disease)    a. 2017: s/p BMS to distal Cx; b. LHC 05/30/2019: 80% mid, distal RCA s/p DES, 30% narrowing of d LM, widely patent LAD w/ luminal irregularities, widely patent stent in dCX  w/ 90+% stenosis distal to stent beofre small trifurcating obtuse marginal (potentially area of restenosis)  . Chronic bronchitis (HCC)   . Chronic ulcer of right great toe (HCC) 09/19/2017  . COPD (chronic obstructive pulmonary disease) (HCC)   . Depression   . DVT (deep venous thrombosis) (HCC)   . GERD (gastroesophageal reflux disease)   . Hyperlipidemia   . Hypertension   . Myocardial infarction (HCC) 1996   "light one"  . PE (pulmonary embolism) 04/2013   On chronic Xarelto  . Peripheral nerve disease   . Pneumonia "several times"  . Sleep apnea   .  Type II diabetes mellitus (HCC)     Family History:  Family History  Problem Relation Age of Onset  . Hypertension Brother   . Hypertension Father   . Diabetes Other   . Hyperlipidemia Other    Social History:  Patient lives at home with his wife who is a smoker. He does have a history of tobacco use. 30-pack year smoking history. He endorses infrequent alcohol use (2-3x/year).  His cousin and brother in law recently passed away last week.  [place]. 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   Review of Systems: A complete ROS was negative except as per HPI.   Physical Exam: Blood pressure 126/85, pulse 88, temperature 97.8 F (36.6 C), temperature source Oral, resp. rate 18, SpO2 97 %. Constitutional: Obese male laying in bed, in no acute distress HENT: Normocephalic, atraumatic Eyes: Extraocular movements intact, pupils equaled and reactive to light  Cardiovascular: Normal rate and rhythm, no murmurs , gallops or rubs, 2+ pitting edema bilaterally Pulmonary: Chest clear to ausculation, no wheezing, rhonchi, or rales MSK: Prior amputation of left 4 toe and right great toe Skin: Erythema to just below the left knee appears worsened from marking around mid calf from last PCP office visit on 3/13,  Dried blood and small amount of purulence from LLE wound, bilateral LE cool to touch, LLE slightly warmer that RLE, ulcer at base of left fourth toe with no drainage or surrounding ereythema  Neurological: No focal deficit, alert and orient x 4         EKG: pending L TIBIA/FIBULA X-RAY:  FINDINGS: There is no evidence of fracture or other focal bone lesions. Moderate severity medial and lateral tibiofemoral compartment space narrowing is seen. A radiopaque vascular stent is seen within the region of the left popliteal fossa  Assessment & Plan by Problem: Active Problems:   Cellulitis of left leg Mr Joseph [name] is a 50 year old male with PMHx of CAD s/p CABG 07/2020, unstable angina, DVT, PE, L toe amputation 07/2020, HFpEF, uncontrolled insulin-dependent diabetes mellitus, hyperlipidemia, GERD, and anxiety presenting with persistent left lower extremity pain and swelling.  Left lower extremity wound  Left foot ulcer  PVD s/p revascularization of the left SFA and left popliteal on 10/10/2020 Patient complaining of worsening LLE pain and swelling that has been ongoing since prior to 02/12/2021.  Recently discharged on 3/13 on 14 day course of doxycyline and keflex and also has been getting IM rocephin from PCP, but reports worsening of symptoms. Has been afebrile and hemodynamically stable since admission. On exam has diffuse erythema over the LLE with dried blood and small amount of purulent drainage possibly from recent trauma, very little warm, pitting edema bilaterally, and small ulcer on planter aspect of foot near left 4th toe base. No leukocytosis, lactic acid 2.2>1.3. Does not appear to have systemic infection. Increased bleeding and drainage may be due to recent trauma, which patient is concerned about although he cannot recall definite incidence of. Likely has poor wound healing in setting of uncontrolled diabetes and PVD. Previously on anticoagulation for history of PE although unclear if he is currently on this now.  Given lack of improvement despite treatment with antibiotics, concerning that chronic venous insufficiency vs DVT may be contributing for persistent swelling and pain.  - Will hold off on antibiotics for now - continue aspirin and plavix  - [place]  LLE  doppler ordered to evaluate for DVT - ABIs ordered - Wound care - Monitor CBC  Prior history of PE Endorsed history of PE in 2014, with 5 clots in his left lung. Was previously on Xarelto for this but is unsure if he is currently taking it, denies significant bleeding while on it. Per chart review Xarelto is listed as a home medication on most recent PCP visit on 3/13, but not listed in his last discharge paperwork on 02/19/2021. Medication recommendation pending - Start on Lovenox vs Xarelto tomorrow morning based once med rec is complete  Type 2 Diabetes Mellitus History of diabetes on Lantus 40 units  twice daily, humalog, Victoza, and Metformin.  Patient presents with elevated blood sugars of 380. No complaints of increased urination, but notes has episodes of hypoglycemia about once a week. Does note that he often misses  taking his diabetes medication. He notes he takes Lantus 50 mg units twice daily which may be why he has been having episodes of hypoglycemia. -Lantus 40 units twice daily -SSI resistant  -CBG monitoring  -Continue pregabalin 150 mg 3 times daily for neuropathic pain.  CAD status post CABG HFrEF secondary to ischemic cardiomyopathy HLD Does not appear fluid overloaded on exam.  - Continue home atorvastatin, carvedilol, hydrochlorothiazide, clopidogrel, aspirin. - Will hold home lasix as patient appear euvolemic  Anxiety -Continue home anxiety medications including alprazolam, bupropion, escitalopram.  Dispo: Admit patient to Observation with expected length of stay less than 2 midnights.  Signed: Quincy Simmonds, MD 03/05/2021, 12:48 AM  Pager: [phone] After 5pm on weekdays and 1pm on weekends: On Call pager: [phone]

## 2021-03-05 NOTE — Consult Note (Signed)
WOC Nurse Consult Note: Reason for Consult: Consult requested for LLE.  Performed remotely after review of progress notes and photos in the EMR. Pt has a history of DVT and vascular insufficiency in the past.  X-rays did not indicate osteomyelitis and ABI is pending.  Topical treatment recommendations requested.  Left plantar foot with chronic full thickness wound; red and moist with mod amt old bloody drainage noted Left anterior calf with generalized cellulitis; edema and erythremia, and partial thickness skin loss with mod amt bloody drainage. Dressing procedure/placement/frequency: Topical treatment orders provided for bedside nurses to perform to promote moist healing as follows: Apply Bactroban to left foot wound Q day, then cover with foam dressing. (Change foam dressing Q 3 days or PRN soiling.) Please re-consult if further assistance is needed.  Thank-you,  Cammie Mcgee MSN, RN, CWOCN, [place], CNS [phone]

## 2021-03-05 NOTE — Progress Notes (Signed)
Subjective:   Joseph [name] confirms he's been having worsening pain, redness, and swelling of his LLE since discharge from the hospital 02/19/21. He is frustrated with having to drive far away to get every other day IM Rocephin injections, and says he'd rather have his leg amputated if his symptoms are not able to be well controlled. He notes that his left shin wound causes "electric" pains although he is unable to feel his foot wounds due to neuropathy. He denies blistering of his skin, fevers, or chills. He notes his sugars at home have been under poor control as he has had time constraints causing him to eat a poor diet high in sugar. Endorses chronic intermittent left-sided CP that is positional, not worse with exertion or improved with rest, and not pleuritic, that has been on-going since his CABG last August.   Objective:  Vital signs in last 24 hours: Vitals:   03/05/21 0900 03/05/21 1111 03/05/21 1432 03/05/21 1721  BP: 120/79 130/87 (!) 131/92 128/85  Pulse: 87 88 88 86  Resp: 19 17 17 17   Temp:   98.4 F (36.9 C) 97.7 F (36.5 C)  TempSrc:   Oral Oral  SpO2: 96% 93% 97% 98%  Weight: (!) 166 kg  (!) 168.8 kg   Height: 6' 3.98" (1.93 m)  6\' 5"  (1.956 m)    General: Patient is morbidly obese. He appears well. No acute distress. Eyes: Sclera non-icteric. No conjunctival injection.  HENT: Poor dentition. No nasal discharge. Respiratory: Lungs are CTA, bilaterally. No wheezes, rales, or rhonchi.  Cardiovascular: Regular rate and rhythm. No murmurs, rubs, or gallops. There is 1+ edema of the bilateral lower extremities.  Abdominal: Soft and non-tender to palpation. Bowel sounds intact. No rebound or guarding. MSK: There is significant tenderness to palpation and swelling of the left lower leg extending to just below the knee. Charcot foot present bilaterally, with left 4th toe and right great toe amputations.  Skin: There is a ~3cm x 1cm scabbed, blistered lesion on patient's left shin  with active bloody discharge and small area of possible purulence. There is a sub-centimeter, ~0.5cm deep ulceration that appears chronic on center of the sole of his left foot. There is a linear laceration without active drainage overlying the 4th toe amputation site. There is significant erythema with induration overlying the left lower leg with skin peeling surrounding shin lesion. Neurological: Patient is alert and oriented x 3. There is decreased sensation to light touch of the left foot.  Psych: Normal affect. Normal tone of voice.   Assessment/Plan:  Active Problems:   Cellulitis of left leg  Purulent LLE Cellulitis in the setting of Multiple Diabetic Foot/Shin Wounds Complicated by PAD s/p revascularization of the L SFA and L popliteal arteries 09/2020 LLE erythema, swelling, and redness have extended above line on shin that patient states had been drawn 3 days ago to just below the knee. MRI of the left lower leg and foot today shows subcutaneous edema, concerning for cellulitis, although showed no signs of abscess, osteomyelitis, myositis or septic joint. LLE ultrasound showed enlarged LN of the groin, consistent with infection, although was negative for DVT. Right ABI was normal, although Left TBI was 0.5, consistent with moderate arterial disease (down from 0.66 on 12/29/20). Did not respond to doxycycline or keflex outpatient. Continues to remain afebrile without leukocytosis.   - Will start cefepime and vancomycin, per pharmacy  - Will need to get better control of risk factors below  - Continue  aspirin and Plavix  - Will consult vascular surgery for further assessment of arterial disease - Continue wound care, appreciate their assistance  - Monitor CBC  Uncontrolled IDDM Type II  History of diabetes on Lantus 40 units  twice daily, humalog, Victoza, and Metformin at home. Notes he has been compliant with medications although has had poor diet high in sugar recently. Sugars remain  elevated in the 200's this afternoon. DM coordinator following, appreciate their recommendations  - Continue home Lantus 40 units twice daily  - Continue Resistant SSI  - Will add Novolog 0-5 units QHS and 10 units TID WC if he heats >50% of meals  - Continue CBG monitoring  - Continue home pregabalin 150 mg 3 times daily for neuropathic pain.  Atypical Chest Pain Hx of CAD s/p CABG in 2021 Patient has history of intermittent left-sided non-pleuritic, non-exertional CP that worsens with movement since CABG last August. Sounds most consistent with MSK pain vs. Atypical angina. EKG did show ST segment elevations in anterior lead, although there were no consecutive T wave or ST changes to suggest acute ischemia.   - Continue to monitor symptoms   Prior history of PE Per chart review, Xarelto is listed as a home medication on most recent PCP visit on 3/13, but not listed in his last discharge paperwork on 02/19/2021.   - Will not start Xarelto given he is already on DAPT - Continue Lovenox 40mg  daily for DVT PPx   HFrEF 2/2 ICM Patient does have 1+ bilateral lower extremity pitting edema. Lungs CTA.   - Will restart home Lasix 40mg  daily  - Continue home Coreg, HCTZ, Clopidogrel - Hold home Lisinopril   Hx of Hypotension Patient takes Midodrine 5mg  TID at home. Blood pressures remain stable without this, although he is not on home lisinopril due to AKI.   - Restart midodrine if he develops hypotension   HLD  - Continue home Atorvastatin - Restarted fenofibrate at slightly higher dose  Anxiety  -Continue home anxiety medications including alprazolam, bupropion, escitalopram.  Prior to Admission Living Arrangement: Home Anticipated Discharge Location: Home, pending OT evaluation Barriers to Discharge: Active infection / further workup  , MD 03/05/2021, 5:40 PM Pager: [phone] After 5pm on weekdays and 1pm on weekends: On Call pager [phone]

## 2021-03-05 NOTE — Evaluation (Signed)
Physical Therapy Evaluation Patient Details Name: Joseph [name] MRN: [id number] DOB: [birth year] Today's Date: 03/05/2021   History of Present Illness  Pt is a 50 y/o male presesnting on 3/16 secondary to increased pain and swelling in LLE and L foot wound. Pt with recent admission secondary to the same. Thought to be secondary to cellulitis; imaging negative for osteomyelitis. PMH includes L 4th toe amputation, DM, CAD s/p CABG, and DVT/PE.  Clinical Impression  Pt admitted secondary to problem above with deficits below. Pt limited in gait tolerance secondary to pain. Requiring min guard A for mobility tasks.  No overt LOB noted. Educated about using cane at home to help with pain management. Pt reports wife can assist as needed. Anticipate he will progress well and will not require follow up PT. Will continue to follow acutely.     Follow Up Recommendations No PT follow up    Equipment Recommendations  Cane    Recommendations for Other Services       Precautions / Restrictions Precautions Precautions: Fall Restrictions Weight Bearing Restrictions: No      Mobility  Bed Mobility Overal bed mobility: Independent                  Transfers Overall transfer level: Needs assistance Equipment used: None Transfers: Sit to/from Stand Sit to Stand: Min guard         General transfer comment: Min guard for safety. No physical assist required.  Ambulation/Gait Ambulation/Gait assistance: Min guard Gait Distance (Feet): 25 Feet Assistive device: IV Pole Gait Pattern/deviations: Step-to pattern;Step-through pattern;Decreased step length - right;Decreased step length - left;Decreased weight shift to left;Antalgic Gait velocity: none   General Gait Details: Antalgic gait secondary to LLE pain. Pt only able to tolerate short distance secondary to pain. Educated about using cane to help with pain management.  Stairs            Wheelchair Mobility    Modified  Rankin (Stroke Patients Only)       Balance Overall balance assessment: Mild deficits observed, not formally tested                                           Pertinent Vitals/Pain Pain Assessment: 0-10 Pain Score: 9  Pain Location: LLE and L hip Pain Descriptors / Indicators: Grimacing;Guarding Pain Intervention(s): Monitored during session;Limited activity within patient's tolerance;Repositioned    Home Living Family/patient expects to be discharged to:: Private residence Living Arrangements: Spouse/significant other;Children Available Help at Discharge: Family;Available PRN/intermittently Type of Home: Mobile home Home Access: Stairs to enter Entrance Stairs-Rails: Right;Left;Can reach both Entrance Stairs-Number of Steps: 2 Home Layout: One level Home Equipment: Shower seat      Prior Function Level of Independence: Independent               Hand Dominance        Extremity/Trunk Assessment   Upper Extremity Assessment Upper Extremity Assessment: Overall WFL for tasks assessed    Lower Extremity Assessment Lower Extremity Assessment: LLE deficits/detail LLE Deficits / Details: Increased swelling and redness noted in LLE. Pt reports "bad hip" at baseline    Cervical / Trunk Assessment Cervical / Trunk Assessment: Normal  Communication   Communication: No difficulties  Cognition Arousal/Alertness: Awake/alert Behavior During Therapy: WFL for tasks assessed/performed Overall Cognitive Status: Within Functional Limits for tasks assessed  General Comments      Exercises     Assessment/Plan    PT Assessment Patient needs continued PT services  PT Problem List Decreased activity tolerance;Decreased mobility;Decreased knowledge of use of DME;Pain       PT Treatment Interventions DME instruction;Gait training;Stair training;Functional mobility training;Balance  training;Therapeutic activities;Therapeutic exercise;Patient/family education    PT Goals (Current goals can be found in the Care Plan section)  Acute Rehab PT Goals Patient Stated Goal: For LLE to get better PT Goal Formulation: With patient Time For Goal Achievement: 03/19/21 Potential to Achieve Goals: Good    Frequency Min 3X/week   Barriers to discharge        Co-evaluation               AM-PAC PT "6 Clicks" Mobility  Outcome Measure Help needed turning from your back to your side while in a flat bed without using bedrails?: None Help needed moving from lying on your back to sitting on the side of a flat bed without using bedrails?: None Help needed moving to and from a bed to a chair (including a wheelchair)?: A Little Help needed standing up from a chair using your arms (e.g., wheelchair or bedside chair)?: A Little Help needed to walk in hospital room?: A Little Help needed climbing 3-5 steps with a railing? : A Little 6 Click Score: 20    End of Session   Activity Tolerance: Patient limited by pain Patient left: in bed;with call bell/phone within reach Nurse Communication: Mobility status PT Visit Diagnosis: Difficulty in walking, not elsewhere classified (R26.2);Pain Pain - Right/Left: Left Pain - part of body: Leg;Ankle and joints of foot    Time: 1550-1609 PT Time Calculation (min) (ACUTE ONLY): 19 min   Charges:   PT Evaluation $PT Eval Low Complexity: 1 Low          Cindee Salt, DPT  Acute Rehabilitation Services  Pager: [phone] Office: [phone]   [name] 03/05/2021, 4:43 PM

## 2021-03-05 NOTE — Progress Notes (Signed)
VASCULAR LAB    ABIs have been performed.  See CV proc for preliminary results.   Deaundra Kutzer, RVT 03/05/2021, 3:38 PM

## 2021-03-05 NOTE — Progress Notes (Signed)
Pharmacy Antibiotic Note  Joseph [name] is a 50 y.o. male admitted on 03/04/2021 with wound infection.  Pharmacy has been consulted for vancomycin and cefepime dosing. Patient was admitted 3/2 for LLE cellulitis and treated with 14 days of doxycycline and Keflex.   Plan: Vancomycin 2000 mg x1, followed by 1000 mg IV every 12 hours (eAUC 435, AUC goal 400-550, Scr 1.73) Cefepime 2 g IV every 8 hours Monitor renal function, clinical status, C&S, vanc levels as indicated  Temp (24hrs), Avg:97.8 F (36.6 C), Min:97.8 F (36.6 C), Max:97.8 F (36.6 C)  Recent Labs  Lab 03/04/21 2035 03/04/21 2233 03/05/21 0230  WBC 8.7  --  8.7  CREATININE 1.76*  --  1.73*  LATICACIDVEN 2.2* 1.3  --     CrCl cannot be calculated (Unknown ideal weight.).    Allergies  Allergen Reactions  . Sulfa Antibiotics Other (See Comments)    Headaches     Antimicrobials this admission: Vanc 3/17> Cefepime 3/17>   Dose adjustments this admission: N/A  Microbiology results: 3/7 Bcx: neg  Thank you for allowing pharmacy to be a part of this patient's care.  Kinnie Feil, PharmD PGY1 Acute Care Pharmacy Resident 03/05/2021 9:18 AM  Please check [email or web address] for unit specific pharmacy phone numbers.

## 2021-03-05 NOTE — Progress Notes (Signed)
Inpatient Diabetes Program Recommendations  AACE/ADA: New Consensus Statement on Inpatient Glycemic Control (2015)  Target Ranges:  Prepandial:   less than 140 mg/dL      Peak postprandial:   less than 180 mg/dL (1-2 hours)      Critically ill patients:  140 - 180 mg/dL   Lab Results  Component Value Date   GLUCAP 358 (H) 03/05/2021   HGBA1C 9.9 (H) 02/18/2021    Review of Glycemic Control Results for RAWLIN, REAUME (MRN [id number]) as of 03/05/2021 11:36  Ref. Range 03/05/2021 08:45 03/05/2021 11:04  Glucose-Capillary Latest Ref Range: 70 - 99 mg/dL 607 (H) 371 (H)   Diabetes history: DM2 Outpatient Diabetes medications: Lantus 70 units BID, Novolog 30 units TID, Victoza 1.2 QHS, Metformin 1000 mg BID Current orders for Inpatient glycemic control: Lantus 40 units BID, Novolog 0-20 units TID  Inpatient Diabetes Program Recommendations:     Novolog 0-5 units QHS Novolog 10 units TID with meals if eats at least 50%   Will continue to follow while inpatient.  Thank you, Dulce Sellar, RN, BSN Diabetes Coordinator Inpatient Diabetes Program [phone] (team pager from 8a-5p)

## 2021-03-06 ENCOUNTER — Inpatient Hospital Stay (HOSPITAL_COMMUNITY): Payer: Medicare HMO

## 2021-03-06 DIAGNOSIS — K219 Gastro-esophageal reflux disease without esophagitis: Secondary | ICD-10-CM | POA: Diagnosis present

## 2021-03-06 DIAGNOSIS — Z882 Allergy status to sulfonamides status: Secondary | ICD-10-CM | POA: Diagnosis not present

## 2021-03-06 DIAGNOSIS — E1122 Type 2 diabetes mellitus with diabetic chronic kidney disease: Secondary | ICD-10-CM | POA: Diagnosis present

## 2021-03-06 DIAGNOSIS — Z794 Long term (current) use of insulin: Secondary | ICD-10-CM | POA: Diagnosis not present

## 2021-03-06 DIAGNOSIS — N179 Acute kidney failure, unspecified: Secondary | ICD-10-CM | POA: Diagnosis present

## 2021-03-06 DIAGNOSIS — Z6841 Body Mass Index (BMI) 40.0 and over, adult: Secondary | ICD-10-CM | POA: Diagnosis not present

## 2021-03-06 DIAGNOSIS — I2510 Atherosclerotic heart disease of native coronary artery without angina pectoris: Secondary | ICD-10-CM | POA: Diagnosis not present

## 2021-03-06 DIAGNOSIS — E11621 Type 2 diabetes mellitus with foot ulcer: Secondary | ICD-10-CM | POA: Diagnosis present

## 2021-03-06 DIAGNOSIS — Z7984 Long term (current) use of oral hypoglycemic drugs: Secondary | ICD-10-CM | POA: Diagnosis not present

## 2021-03-06 DIAGNOSIS — E1159 Type 2 diabetes mellitus with other circulatory complications: Secondary | ICD-10-CM | POA: Diagnosis not present

## 2021-03-06 DIAGNOSIS — E1151 Type 2 diabetes mellitus with diabetic peripheral angiopathy without gangrene: Secondary | ICD-10-CM | POA: Diagnosis present

## 2021-03-06 DIAGNOSIS — Z9889 Other specified postprocedural states: Secondary | ICD-10-CM | POA: Diagnosis not present

## 2021-03-06 DIAGNOSIS — I130 Hypertensive heart and chronic kidney disease with heart failure and stage 1 through stage 4 chronic kidney disease, or unspecified chronic kidney disease: Secondary | ICD-10-CM | POA: Diagnosis present

## 2021-03-06 DIAGNOSIS — G4733 Obstructive sleep apnea (adult) (pediatric): Secondary | ICD-10-CM | POA: Diagnosis present

## 2021-03-06 DIAGNOSIS — N182 Chronic kidney disease, stage 2 (mild): Secondary | ICD-10-CM | POA: Diagnosis present

## 2021-03-06 DIAGNOSIS — L03116 Cellulitis of left lower limb: Secondary | ICD-10-CM

## 2021-03-06 DIAGNOSIS — I70244 Atherosclerosis of native arteries of left leg with ulceration of heel and midfoot: Secondary | ICD-10-CM | POA: Diagnosis not present

## 2021-03-06 DIAGNOSIS — E538 Deficiency of other specified B group vitamins: Secondary | ICD-10-CM | POA: Diagnosis present

## 2021-03-06 DIAGNOSIS — Z20822 Contact with and (suspected) exposure to covid-19: Secondary | ICD-10-CM | POA: Diagnosis present

## 2021-03-06 DIAGNOSIS — E119 Type 2 diabetes mellitus without complications: Secondary | ICD-10-CM | POA: Diagnosis not present

## 2021-03-06 DIAGNOSIS — I739 Peripheral vascular disease, unspecified: Secondary | ICD-10-CM | POA: Diagnosis not present

## 2021-03-06 DIAGNOSIS — E785 Hyperlipidemia, unspecified: Secondary | ICD-10-CM | POA: Diagnosis present

## 2021-03-06 DIAGNOSIS — F419 Anxiety disorder, unspecified: Secondary | ICD-10-CM | POA: Diagnosis present

## 2021-03-06 DIAGNOSIS — E875 Hyperkalemia: Secondary | ICD-10-CM | POA: Diagnosis present

## 2021-03-06 DIAGNOSIS — E11649 Type 2 diabetes mellitus with hypoglycemia without coma: Secondary | ICD-10-CM | POA: Diagnosis present

## 2021-03-06 DIAGNOSIS — E11628 Type 2 diabetes mellitus with other skin complications: Secondary | ICD-10-CM | POA: Diagnosis not present

## 2021-03-06 DIAGNOSIS — I5032 Chronic diastolic (congestive) heart failure: Secondary | ICD-10-CM | POA: Diagnosis present

## 2021-03-06 DIAGNOSIS — Z951 Presence of aortocoronary bypass graft: Secondary | ICD-10-CM | POA: Diagnosis not present

## 2021-03-06 DIAGNOSIS — E1165 Type 2 diabetes mellitus with hyperglycemia: Secondary | ICD-10-CM | POA: Diagnosis present

## 2021-03-06 LAB — CBC
HCT: 35.9 % — ABNORMAL LOW (ref 39.0–52.0)
Hemoglobin: 10.8 g/dL — ABNORMAL LOW (ref 13.0–17.0)
MCH: 24.8 pg — ABNORMAL LOW (ref 26.0–34.0)
MCHC: 30.1 g/dL (ref 30.0–36.0)
MCV: 82.5 fL (ref 80.0–100.0)
Platelets: 319 10*3/uL (ref 150–400)
RBC: 4.35 MIL/uL (ref 4.22–5.81)
RDW: 14.7 % (ref 11.5–15.5)
WBC: 9.2 10*3/uL (ref 4.0–10.5)
nRBC: 0 % (ref 0.0–0.2)

## 2021-03-06 LAB — BASIC METABOLIC PANEL
Anion gap: 6 (ref 5–15)
BUN: 33 mg/dL — ABNORMAL HIGH (ref 6–20)
CO2: 27 mmol/L (ref 22–32)
Calcium: 8.9 mg/dL (ref 8.9–10.3)
Chloride: 103 mmol/L (ref 98–111)
Creatinine, Ser: 1.5 mg/dL — ABNORMAL HIGH (ref 0.61–1.24)
GFR, Estimated: 57 mL/min — ABNORMAL LOW (ref >60)
Glucose, Bld: 241 mg/dL — ABNORMAL HIGH (ref 70–99)
Potassium: 5.7 mmol/L — ABNORMAL HIGH (ref 3.5–5.1)
Sodium: 136 mmol/L (ref 135–145)

## 2021-03-06 LAB — POTASSIUM: Potassium: 5.2 mmol/L — ABNORMAL HIGH (ref 3.5–5.1)

## 2021-03-06 LAB — GLUCOSE, CAPILLARY
Glucose-Capillary: 251 mg/dL — ABNORMAL HIGH (ref 70–99)
Glucose-Capillary: 317 mg/dL — ABNORMAL HIGH (ref 70–99)
Glucose-Capillary: 187 mg/dL — ABNORMAL HIGH (ref 70–99)
Glucose-Capillary: 226 mg/dL — ABNORMAL HIGH (ref 70–99)
Glucose-Capillary: 127 mg/dL — ABNORMAL HIGH (ref 70–99)

## 2021-03-06 MED ORDER — ENOXAPARIN SODIUM 80 MG/0.8ML ~~LOC~~ SOLN
80.0000 mg | SUBCUTANEOUS | Status: DC
Start: 2021-03-06 — End: 2021-03-15
  Administered 2021-03-06 – 2021-03-14 (×9): 80 mg via SUBCUTANEOUS
  Filled 2021-03-06 (×9): qty 0.8

## 2021-03-06 MED ORDER — FUROSEMIDE 10 MG/ML IJ SOLN
40.0000 mg | Freq: Once | INTRAMUSCULAR | Status: AC
  Administered 2021-03-06: 40 mg via INTRAVENOUS
  Filled 2021-03-06: qty 4

## 2021-03-06 MED ORDER — OXYCODONE HCL 5 MG PO TABS
5.0000 mg | ORAL_TABLET | ORAL | Status: DC | PRN
  Administered 2021-03-06 – 2021-03-12 (×8): 5 mg via ORAL
  Filled 2021-03-06 (×9): qty 1

## 2021-03-06 MED ORDER — INSULIN ASPART 100 UNIT/ML ~~LOC~~ SOLN
10.0000 [IU] | Freq: Three times a day (TID) | SUBCUTANEOUS | Status: DC
  Administered 2021-03-06 – 2021-03-08 (×6): 10 [IU] via SUBCUTANEOUS

## 2021-03-06 MED ORDER — INSULIN ASPART 100 UNIT/ML ~~LOC~~ SOLN: 14.0000 [IU] | Freq: Three times a day (TID) | SUBCUTANEOUS | Status: DC

## 2021-03-06 NOTE — Plan of Care (Signed)

## 2021-03-06 NOTE — Evaluation (Signed)
Occupational Therapy Evaluation Patient Details Name: Joseph [name] MRN: [id number] DOB: 06/08/1971 Today's Date: 03/06/2021    History of Present Illness Pt is a 50 y/o male presesnting on 3/16 secondary to increased pain and swelling in LLE and L foot wound. Pt with recent admission secondary to the same. Thought to be secondary to cellulitis; imaging negative for osteomyelitis. PMH includes L 4th toe amputation, DM, CAD s/p CABG, and DVT/PE.   Clinical Impression   PTA patient was living with his wife and adult son in a private residence and was independent with ADLs/IADLs without AD with the exception of requiring assist to doff/don footwear. Patient does not work. Patient currently functioning near his baseline but would benefit from continued acute OT services to maximize safety and independence with self-care tasks. Patient also expressed concern for worsening STM stating he often forgets to pay his bills. States wife assists with med management but patient would like to remain independent with managing finances. Patient able to recall 3/3 words on BIMs with 3 min delay. Extensive time spent on educating patient about walking in the hallway with nursing staff and getting to the recliner for all meals to decrease risk of hospital acquired debility. Patient expressed verbal understanding. Ot will continue to follow acutely.     Follow Up Recommendations  No OT follow up;Supervision - Intermittent    Equipment Recommendations  None recommended by OT (Reports tub bench will not fit in his bathroom)    Recommendations for Other Services       Precautions / Restrictions Precautions Precautions: Fall Precaution Comments: Reports 2 falls (unable to recall when or why he fell). Restrictions Weight Bearing Restrictions: No      Mobility Bed Mobility Overal bed mobility: Independent                  Transfers Overall transfer level: Needs assistance Equipment used:  None Transfers: Sit to/from Stand Sit to Stand: Min guard         General transfer comment: Min guard for safety. No physical assist required.    Balance Overall balance assessment: Mild deficits observed, not formally tested                                         ADL either performed or assessed with clinical judgement   ADL Overall ADL's : Needs assistance/impaired                 Upper Body Dressing : Set up;Sitting   Lower Body Dressing: Minimal assistance;Sit to/from stand Lower Body Dressing Details (indicate cue type and reason): Assist to don footwear. Son assists with donning/doffing footwear at home. Toilet Transfer: Supervision/safety;Min Pension scheme manager Details (indicate cue type and reason): Simulated with transfer to recliner without AD. Limp 2/2 "bad L hip".         Functional mobility during ADLs: Supervision/safety;Min guard General ADL Comments: Patient limited by STM deficits, LLE edema/pain, and decreased activity tolerance.     Vision Baseline Vision/History: Wears glasses Wears Glasses: At all times Patient Visual Report: No change from baseline       Perception     Praxis      Pertinent Vitals/Pain Pain Assessment: 0-10 Pain Score: 8  Pain Location: chest (stabbing from CABG, L hip and LLE Pain Descriptors / Indicators: Grimacing;Guarding;Stabbing Pain Intervention(s): Limited activity within patient's tolerance;Monitored during session;Repositioned  Hand Dominance Right   Extremity/Trunk Assessment Upper Extremity Assessment Upper Extremity Assessment: Overall WFL for tasks assessed   Lower Extremity Assessment Lower Extremity Assessment: Defer to PT evaluation   Cervical / Trunk Assessment Cervical / Trunk Assessment: Normal   Communication Communication Communication: No difficulties   Cognition Arousal/Alertness: Awake/alert Behavior During Therapy: WFL for tasks assessed/performed Overall  Cognitive Status: No family/caregiver present to determine baseline cognitive functioning Area of Impairment: Memory                     Memory: Decreased short-term memory         General Comments: Patient reports increased STM deficits since CABG.   General Comments  Swelling and redness noted in LLE (patient states swelling in decreased from hospital admission).    Exercises     Shoulder Instructions      Home Living Family/patient expects to be discharged to:: Private residence Living Arrangements: Spouse/significant other;Children Available Help at Discharge: Family;Available PRN/intermittently Type of Home: Mobile home Home Access: Stairs to enter Entrance Stairs-Number of Steps: 2 Entrance Stairs-Rails: Right;Left;Can reach both Home Layout: One level     Bathroom Shower/Tub: Chief Strategy Officer: Standard     Home Equipment: Information systems manager;Wheelchair - manual (Wc too narrow for him)          Prior Functioning/Environment Level of Independence: Independent        Comments: On disability, drives        OT Problem List: Decreased strength;Decreased activity tolerance;Impaired balance (sitting and/or standing);Decreased cognition;Decreased safety awareness;Increased edema      OT Treatment/Interventions: Self-care/ADL training;Therapeutic exercise;Energy conservation;DME and/or AE instruction;Therapeutic activities;Cognitive remediation/compensation;Patient/family education;Balance training    OT Goals(Current goals can be found in the care plan section) Acute Rehab OT Goals Patient Stated Goal: To return home OT Goal Formulation: With patient Time For Goal Achievement: 03/20/21 Potential to Achieve Goals: Good ADL Goals Additional ADL Goal #1: Patient will complete a.m. ADLs with no more than supervision A, AE PRN, and LRAD. Additional ADL Goal #2: Patient will utilize 3 compensatory strategies 2/2 decreased STM in prep for  IADLs. Additional ADL Goal #3: Patient will complete BUE HEP with I and use of written handout.  OT Frequency: Min 2X/week   Barriers to D/C:            Co-evaluation              AM-PAC OT "6 Clicks" Daily Activity     Outcome Measure Help from another person eating meals?: None Help from another person taking care of personal grooming?: A Little Help from another person toileting, which includes using toliet, bedpan, or urinal?: A Little Help from another person bathing (including washing, rinsing, drying)?: A Little Help from another person to put on and taking off regular upper body clothing?: A Little Help from another person to put on and taking off regular lower body clothing?: A Little 6 Click Score: 19   End of Session Equipment Utilized During Treatment: Gait belt Nurse Communication: Mobility status (Walk patient in hallway, up to chair for all meals.)  Activity Tolerance: Patient tolerated treatment well Patient left: in chair;with call bell/phone within reach  OT Visit Diagnosis: Pain Pain - Right/Left: Left Pain - part of body: Leg;Hip                Time: 3825-0539 OT Time Calculation (min): 24 min Charges:  OT General Charges $OT Visit: 1 Visit OT Evaluation $OT Eval  Moderate Complexity: 1 Mod OT Treatments $Therapeutic Activity: 8-22 mins  Davaughn Hillyard H. OTR/L Supplemental OT, Department of rehab services [phone]  Math Brazie R H. 03/06/2021, 8:51 AM

## 2021-03-06 NOTE — Progress Notes (Signed)
Left lower extremity arterial duplex  has been completed. Refer to [facility] under chart review to view preliminary results.   03/06/2021  10:40 AM Lillyen Schow, Gerarda Gunther

## 2021-03-06 NOTE — Progress Notes (Addendum)
  Progress Note    03/06/2021 7:29 AM * No surgery found *  Subjective:  Pain LLE however unchanged overnight   Vitals:   03/06/21 0007 03/06/21 0443  BP: (!) 125/99 (!) (P) 140/95  Pulse: 83 (P) 86  Resp: 18 (P) 18  Temp: 98 F (36.7 C) (P) 98 F (36.7 C)  SpO2: 97% (P) 97%   Physical Exam: Lungs:  Non labored Extremities:  Dressing left in place LLE; motor intact L foot; warm and well perfused Neurologic: A&O  CBC    Component Value Date/Time   WBC 9.2 03/06/2021 0155   RBC 4.35 03/06/2021 0155   HGB 10.8 (L) 03/06/2021 0155   HGB 14.9 05/25/2019 1613   HCT 35.9 (L) 03/06/2021 0155   HCT 43.9 05/25/2019 1613   PLT 319 03/06/2021 0155   PLT 285 05/25/2019 1613   MCV 82.5 03/06/2021 0155   MCV 85 05/25/2019 1613   MCH 24.8 (L) 03/06/2021 0155   MCHC 30.1 03/06/2021 0155   RDW 14.7 03/06/2021 0155   RDW 12.9 05/25/2019 1613   LYMPHSABS 2.6 03/04/2021 2035   LYMPHSABS 3.1 05/25/2019 1613   MONOABS 0.8 03/04/2021 2035   EOSABS 0.2 03/04/2021 2035   EOSABS 0.4 05/25/2019 1613   BASOSABS 0.1 03/04/2021 2035   BASOSABS 0.1 05/25/2019 1613    BMET    Component Value Date/Time   NA 136 03/06/2021 0155   NA 140 11/20/2020 1253   K 5.7 (H) 03/06/2021 0155   CL 103 03/06/2021 0155   CO2 27 03/06/2021 0155   GLUCOSE 241 (H) 03/06/2021 0155   BUN 33 (H) 03/06/2021 0155   BUN 30 (H) 11/20/2020 1253   CREATININE 1.50 (H) 03/06/2021 0155   CREATININE 1.21 09/29/2016 1134   CALCIUM 8.9 03/06/2021 0155   GFRNONAA 57 (L) 03/06/2021 0155   GFRAA 72 11/20/2020 1253    INR    Component Value Date/Time   INR 1.5 (H) 07/24/2020 1652     Intake/Output Summary (Last 24 hours) at 03/06/2021 0729 Last data filed at 03/05/2021 1702 Gross per 24 hour  Intake 231.57 ml  Output --  Net 231.57 ml     Assessment/Plan:  50 y.o. male with cellulitis LLE   L foot warm with motor intact Agree with IV abx Arterial duplex this morning; consider angiography if problems  seen with popliteal stent   Emilie Rutter, PA-C [facility] [phone] 03/06/2021 7:29 AM  I have independently interviewed and examined patient and agree with PA assessment and plan above.  We will check left lower extremity duplex for stent patency.  I have notified Dr. [name] of the patient's admission.  Brice Kossman C. Randie Heinz, MD [facility] [place] Office: [phone] Pager: [phone]

## 2021-03-06 NOTE — Progress Notes (Signed)
Physical Therapy Treatment Patient Details Name: Joseph [name] MRN: [id number] DOB: August 02, 1971 Today's Date: 03/06/2021    History of Present Illness Pt is a 50 y/o male presesnting on 3/16 secondary to increased pain and swelling in LLE and L foot wound. Pt with recent admission secondary to the same. Thought to be secondary to cellulitis; imaging negative for osteomyelitis. PMH includes L 4th toe amputation, DM, CAD s/p CABG, and DVT/PE.    PT Comments    Pt progressing towards goals. Increased tolerance for gait using cane this session. Pt reporting increased comfort with use of cane as well. Required min guard A for mobility tasks. Did note slower speech this session and fatigue, however, no other symptoms. Pt reports it is likely secondary to pain medications he is taking. Current recommendations appropriate. Will need to practice steps prior to d/c to ensure safety. Will continue to follow acutely.    Follow Up Recommendations  No PT follow up     Equipment Recommendations  Cane    Recommendations for Other Services       Precautions / Restrictions Precautions Precautions: Fall Restrictions Weight Bearing Restrictions: No    Mobility  Bed Mobility Overal bed mobility: Independent                  Transfers Overall transfer level: Needs assistance Equipment used: Straight cane Transfers: Sit to/from Stand Sit to Stand: Min guard         General transfer comment: Min guard for safety. No physical assist required.  Ambulation/Gait Ambulation/Gait assistance: Min guard Gait Distance (Feet): 120 Feet Assistive device: Straight cane Gait Pattern/deviations: Step-to pattern;Decreased weight shift to left;Ataxic;Step-through pattern Gait velocity: Decreased   General Gait Details: Pt practiced with cane this session and noted improve  distance. Pt instructed to use cane in R hand, however, using in L because he reported it felt better.   Stairs              Wheelchair Mobility    Modified Rankin (Stroke Patients Only)       Balance Overall balance assessment: Mild deficits observed, not formally tested                                          Cognition Arousal/Alertness: Awake/alert Behavior During Therapy: WFL for tasks assessed/performed Overall Cognitive Status: No family/caregiver present to determine baseline cognitive functioning                                 General Comments: Pt seems to have slower speech this session. Reports he had taken pain medication prior. Also reporting increased fatigue.      Exercises      General Comments General comments (skin integrity, edema, etc.): Continued to note swelling and pain in LLE      Pertinent Vitals/Pain Pain Assessment: Faces Faces Pain Scale: Hurts even more Pain Location: L hip and LLE Pain Descriptors / Indicators: Grimacing;Guarding Pain Intervention(s): Limited activity within patient's tolerance;Monitored during session;Repositioned    Home Living                      Prior Function            PT Goals (current goals can now be found in the care plan section) Acute Rehab PT Goals  Patient Stated Goal: To return home PT Goal Formulation: With patient Time For Goal Achievement: 03/19/21 Potential to Achieve Goals: Good Progress towards PT goals: Progressing toward goals    Frequency    Min 3X/week      PT Plan Current plan remains appropriate    Co-evaluation              AM-PAC PT "6 Clicks" Mobility   Outcome Measure  Help needed turning from your back to your side while in a flat bed without using bedrails?: None Help needed moving from lying on your back to sitting on the side of a flat bed without using bedrails?: None Help needed moving to and from a bed to a chair (including a wheelchair)?: A Little Help needed standing up from a chair using your arms (e.g., wheelchair or bedside  chair)?: A Little Help needed to walk in hospital room?: A Little Help needed climbing 3-5 steps with a railing? : A Little 6 Click Score: 20    End of Session   Activity Tolerance: Patient limited by pain Patient left: in bed;with call bell/phone within reach Nurse Communication: Mobility status PT Visit Diagnosis: Difficulty in walking, not elsewhere classified (R26.2);Pain Pain - Right/Left: Left Pain - part of body: Leg;Ankle and joints of foot     Time: 2229-7989 PT Time Calculation (min) (ACUTE ONLY): 18 min  Charges:  $Gait Training: 8-22 mins                     Cindee Salt, DPT  Acute Rehabilitation Services  Pager: [phone] Office: [phone]    Joseph [name] 03/06/2021, 5:00 PM

## 2021-03-06 NOTE — Progress Notes (Signed)
Subjective:   Overnight, Joseph [name] endorsed worsening pain of his LLE and his oxycodone was increased to his home dose. This morning, he is resting comfortably although states that his left leg pain is severe at a 9/10. He feels his left leg is significantly warmer to touch than yesterday and is unable to comment on any other changes as his lower leg was wrapped in a bandage. He continues to eat and drink well. He does note SOB on exertion at home with orthopnea. Denies any other concerns.   Objective:  Vital signs in last 24 hours: Vitals:   03/06/21 0007 03/06/21 0443 03/06/21 0552 03/06/21 0800  BP: (!) 125/99 (!) (P) 140/95  134/74  Pulse: 83 (P) 86  92  Resp: 18 (P) 18  18  Temp: 98 F (36.7 C) (P) 98 F (36.7 C)  98.6 F (37 C)  TempSrc: Oral (P) Oral  Oral  SpO2: 97% (P) 97%  95%  Weight:   (!) 169.2 kg   Height:       General: Patient is morbidly obese. He appears well. No acute distress. HENT: Poor dentition. No nasal discharge. Respiratory: Lung sounds are decreased but otherwise CTA, bilaterally. No wheezes, rales, or rhonchi.  Cardiovascular: Regular rate and rhythm. There is a 2/6 blowing systolic murmur heard best along the right upper sternal border. No other m/r/g. There is 1-2+ bilateral lower extremity pitting edema to the knees. RLE is cool and LLE is very warm to the touch. Musculoskeletal: There is significant tenderness to palpation over the left shin. Charcot foot present bilaterally, with left 4th toe and right great toe amputations.  Skin: There is unchanged erythema with increased warmth of the left lower leg with chronic appearing induration. Bandage currently in place without any notable active drainage. There is also a 0.25x0.5cm hyperpigmented lesion over the DIP of patient's left 5th digit.  Neurological: Patient is alert and oriented x 3. He has delayed short term recall.   Assessment/Plan:  Active Problems:   Cellulitis   Cellulitis of left  leg  Purulent LLE Cellulitis in the setting of Multiple Diabetic Foot/Shin Wounds and Moderate PAD of the LLE  Complicated by PAD s/p revascularization of the L SFA and L popliteal arteries 09/2020 LLE has increased warmth since yesterday although redness, erythema, and induration remain unchanged to just below the knee. Wound care on board and dressing is in place; appreciate their assistance. Left TBI was 0.5 with multiphasic signals and left toe pressure of 65. Vascular were consulted and were unable to easily palpate pedal pulses, likely due to edema; however, it is possible patient has occlusion of his stents.   - Check LLE duplex ultrasound for stent patency - Consider angiography if above is abnormal - Continue Cefepime and Vancomycin, per pharmacy  - Continue ASA and Plavix  - Added 5mg  Roxicodone q3 hours PRN  - Will work to get better control of risk factors below  - Continue wound care  - Continue to monitor left 5th digit lesion  - Monitor CBC's daily   Uncontrolled IDDM Type II  History of diabetes on Lantus 40 units  twice daily, humalog, Victoza, and Metformin at home. Sugars remain elevated up to 317 this morning.   - Continue home Lantus 40 units twice daily  - Continue Resistant SSI  - Continue mealtime 10 units Novolog TID if >50% meal is eaten  - Continue Novolog 0-5 units QHS  - Continue CBG monitoring  - Continue home  pregabalin 150 mg 3 times daily for neuropathic pain.  HFpEF Patient does have persistent 1-2+ bilateral lower extremity pitting edema below the knees although lungs are CTA bilaterally. Takes Lasix 40mg  daily.  - Start IV Lasix 40mg  daily  - Monitor Strict I&O and daily weights - Continue home Coreg, HCTZ, Clopidogrel - Hold home Lisinopril for now; will likely restart tomorrow pending renal function  AKI on CKD Stage II Creatinine has improved from 1.76 on admission to 1.5, with GFR 57 and baseline Cr around 1.3.  - Continue to monitor daily  renal function   Atypical Chest Pain Hx of CAD s/p CABG in 2021 Denies any new symptoms.   - Continue to monitor   Prior history of PE  - Discontinued home Xarelto as he is already on DAPT  - Increase Lovenox to 80mg  daily for DVT PPx   Hx of Hypotension Blood pressures remain in normal range.   - Continue Coreg 3.125mg  BID  - Continue to hold lisinopril for now, pending renal function tomorrow  - Restart midodrine if he develops hypotension on Lasix  HLD  - Continue home Atorvastatin - Continue fenofibrate daily   Anxiety  -Continue home anxiety medications including alprazolam, bupropion, escitalopram.  Prior to Admission Living Arrangement: Home Anticipated Discharge Location: Home (pending further vascular workup)   , MD 03/06/2021, 11:41 AM Pager: [phone] After 5pm on weekdays and 1pm on weekends: On Call pager [phone]

## 2021-03-07 DIAGNOSIS — N182 Chronic kidney disease, stage 2 (mild): Secondary | ICD-10-CM | POA: Diagnosis not present

## 2021-03-07 DIAGNOSIS — E1159 Type 2 diabetes mellitus with other circulatory complications: Secondary | ICD-10-CM | POA: Diagnosis not present

## 2021-03-07 DIAGNOSIS — E11628 Type 2 diabetes mellitus with other skin complications: Secondary | ICD-10-CM | POA: Diagnosis not present

## 2021-03-07 DIAGNOSIS — L03116 Cellulitis of left lower limb: Secondary | ICD-10-CM | POA: Diagnosis not present

## 2021-03-07 LAB — GLUCOSE, CAPILLARY
Glucose-Capillary: 211 mg/dL — ABNORMAL HIGH (ref 70–99)
Glucose-Capillary: 235 mg/dL — ABNORMAL HIGH (ref 70–99)
Glucose-Capillary: 139 mg/dL — ABNORMAL HIGH (ref 70–99)
Glucose-Capillary: 199 mg/dL — ABNORMAL HIGH (ref 70–99)

## 2021-03-07 LAB — CBC
HCT: 35.1 % — ABNORMAL LOW (ref 39.0–52.0)
Hemoglobin: 10.9 g/dL — ABNORMAL LOW (ref 13.0–17.0)
MCH: 25.2 pg — ABNORMAL LOW (ref 26.0–34.0)
MCHC: 31.1 g/dL (ref 30.0–36.0)
MCV: 81.1 fL (ref 80.0–100.0)
Platelets: 312 10*3/uL (ref 150–400)
RBC: 4.33 MIL/uL (ref 4.22–5.81)
RDW: 15 % (ref 11.5–15.5)
WBC: 11.6 10*3/uL — ABNORMAL HIGH (ref 4.0–10.5)
nRBC: 0 % (ref 0.0–0.2)

## 2021-03-07 LAB — MAGNESIUM
Magnesium: 1.5 mg/dL — ABNORMAL LOW (ref 1.7–2.4)
Magnesium: 2.1 mg/dL (ref 1.7–2.4)

## 2021-03-07 LAB — BASIC METABOLIC PANEL
Anion gap: 7 (ref 5–15)
BUN: 39 mg/dL — ABNORMAL HIGH (ref 6–20)
CO2: 25 mmol/L (ref 22–32)
Calcium: 8.7 mg/dL — ABNORMAL LOW (ref 8.9–10.3)
Chloride: 103 mmol/L (ref 98–111)
Creatinine, Ser: 1.71 mg/dL — ABNORMAL HIGH (ref 0.61–1.24)
GFR, Estimated: 49 mL/min — ABNORMAL LOW (ref >60)
Glucose, Bld: 177 mg/dL — ABNORMAL HIGH (ref 70–99)
Potassium: 4.8 mmol/L (ref 3.5–5.1)
Sodium: 135 mmol/L (ref 135–145)

## 2021-03-07 LAB — PHOSPHORUS: Phosphorus: 4.1 mg/dL (ref 2.5–4.6)

## 2021-03-07 MED ORDER — MAGNESIUM SULFATE 4 GM/100ML IV SOLN
4.0000 g | Freq: Once | INTRAVENOUS | Status: AC
  Administered 2021-03-07: 4 g via INTRAVENOUS
  Filled 2021-03-07: qty 100

## 2021-03-07 MED ORDER — INSULIN GLARGINE 100 UNIT/ML ~~LOC~~ SOLN
44.0000 [IU] | Freq: Two times a day (BID) | SUBCUTANEOUS | Status: DC
  Administered 2021-03-07: 44 [IU] via SUBCUTANEOUS
  Filled 2021-03-07 (×3): qty 0.44

## 2021-03-07 MED ORDER — LACTATED RINGERS IV SOLN: INTRAVENOUS | Status: AC

## 2021-03-07 NOTE — Plan of Care (Signed)

## 2021-03-07 NOTE — Progress Notes (Signed)
Left leg still edematous with erythema MRI reviewed, no abscess Punctate opening bottom of foot no purulence  Severe cellulitis left leg Arteriogram next week, however since stent is patent I am not sure how much extra perfusion we are going to generate to heal a cellulitis  Would continue IV antibiotics  Pt is accepting of the the fact that this may eventually lead to amputation of leg.  Fabienne Bruns, MD [facility] [place] Office: [phone]

## 2021-03-07 NOTE — Progress Notes (Addendum)
Subjective:   Mr. Maring states that his leg continues to be painful. He is unable to say whether swelling, redness, or warmth have improved. He notes that he doesn't have feeling in his bilateral hands to his wrists. Denies fevers, chills, shortness of breath, or any other symptoms. He notes his memory is poor and wonders how long he will need to stay in the hospital.   Objective:  Vital signs in last 24 hours: Vitals:   03/06/21 0800 03/06/21 1759 03/06/21 2341 03/07/21 0417  BP: 134/74 100/66 95/67 104/87  Pulse: 92 80 85 88  Resp: 18 19 18 18   Temp: 98.6 F (37 C) 98.9 F (37.2 C) 97.9 F (36.6 C) 98.5 F (36.9 C)  TempSrc: Oral Oral Oral Oral  SpO2: 95% 96% 96% 100%  Weight:    (!) 169.5 kg  Height:       General: Patient is morbidly obese. He appears well. No acute distress. HENT: Poor dentition. No nasal discharge. Respiratory: Lungs CTA bilaterally without wheezes, rales, or rhonchi.  Cardiovascular: Regular rate and rhythm. There is a 2/6 blowing systolic murmur heard best along the right upper sternal border. No other m/r/g. There is 1+ bilateral lower extremity pitting edema to the knees. Bilateral hands are cool to the touch, although radial pulses are 2+ bilaterally. Unable to palpate DP pulse in LLE.  Musculoskeletal: LLE shin tenderness has improved. Charcot foot present bilaterally, with left 4th toe and right great toe amputations.  Skin: Bandage is in place over left shin wound, with active purulent/serosanguinous drainage. Erythema has improved slightly since yesterday in LLE with decreased warmth and swelling. There is also a 0.25x0.5cm hyperpigmented lesion over the DIP of patient's left 5th digit that has remained unchanged since yesterday with possible small lesions on tips of digits of right hand. Bilateral hands are cold to the touch. Chronic venous stasis changes noted of the lower extremities.  Neurological: Sensation to light touch is absent in bilateral  hands to the wrists. There is numbness of Patient is alert and oriented x 3. He has delayed short term recall.   Assessment/Plan:  Active Problems:   Cellulitis   Cellulitis of left leg  Purulent LLE Cellulitis in the setting of Multiple Diabetic Foot/Shin Wounds and Moderate PAD of the LLE s/p revascularization of the L SFA and L popliteal arteries 09/2020 Ulceration of the right 5th digit, Concerning for Ischemia LLE tenderness, redness, and swelling have slightly improved since yesterday. Continues to experience pain in shin. Remains afebrile although with new mild leukocytosis and borderline hypotension. Left TBI was 0.5 with multiphasic signals and left toe pressure of 65. LLE Arterial Duplex showed 50-74% occlusion of the distal SFA s/p stenting. Patient does have a small, dusky lesion over the DIP of the left 5th digit and possible smaller lesions at the tips of the right fingers and hands are cold to the touch with absence of sensation to the wrists bilaterally. Concerning for UE ischemic disease.  - Vascular following; appreciate their recommendations  - Plan for arteriogram next week although are unsure of extra perfusion benefit due to stent patency - Discontinued cefepime  - Continue vancomycin, per pharmacy  - Continue ASA and Plavix  - Encourage use of 5mg  Roxicodone q3 hours PRN for pain - Will work to get better control of risk factors below  - Continue wound care  - Will notify vascular surgery of lesion on left 5th digit  - Monitor CBC's daily   Uncontrolled IDDM  Type II  History of diabetes on Lantus 40 units twice daily, humalog, Victoza, and Metformin at home. Sugars improved since yesterday with addition of mealtime insulin, although more consistently elevated to the 200's this morning.   - Will increase Lantus to 45 units twice daily - Continue Resistant SSI  - Continue mealtime 10 units Novolog TID if >50% meal is eaten  - Continue Novolog 0-5 units QHS  -  Continue CBG monitoring  - Continue home pregabalin 150 mg 3 times daily for neuropathic pain.  HFpEF, Stable Chronic Venous Stasis  Bilateral LE pitting edema is slightly improved since yesterday; however, patient had small bump in creatinine since yesterday after home Lasix 40mg  daily was started. No crackles on examination. Anticipate that his LE swelling may be due to chronic venous stasis given skin findings above.  - Discontinue Lasix  - Will start LR 160mL/hr for 8 hours  - Monitor Strict I&O and daily weights - Will discontinue home lisinopril and coreg given soft pressures  AKI on CKD Stage II Creatinine increased slightly since yesterday to 1.71 following Lasix with baseline Cr around 1.3.  - Monitor for improvement on fluids  - Continue to monitor daily renal function   Hx of Hypotension Pressures were soft overnight on home Coreg 3.125mg  BID only. Holding home lisinopril and midodrine. May be in setting of diuresis.  - Will hold Coreg 3.125mg  BID  - Consider addition of midodrine if pressures remain low   Hypomagnesemia  Hyperkalemia, Resolved Mag of 1.5, replaced IV.   - Repeat magnesium this afternoon with replacement to >2 as needed  Atypical Chest Pain Hx of CAD s/p CABG in 2021 Denies any new symptoms.   - Continue to monitor   Anxiety, Stable  -Continue home anxiety medications including alprazolam, bupropion, escitalopram.  Prior to Admission Living Arrangement: Home Anticipated Discharge Location: Home (pending further vascular workup)   2022, MD 03/07/2021, 6:09 AM Pager: [phone] After 5pm on weekdays and 1pm on weekends: On Call pager [phone]

## 2021-03-08 LAB — CBC
HCT: 33.1 % — ABNORMAL LOW (ref 39.0–52.0)
Hemoglobin: 10.2 g/dL — ABNORMAL LOW (ref 13.0–17.0)
MCH: 24.8 pg — ABNORMAL LOW (ref 26.0–34.0)
MCHC: 30.8 g/dL (ref 30.0–36.0)
MCV: 80.3 fL (ref 80.0–100.0)
Platelets: 287 10*3/uL (ref 150–400)
RBC: 4.12 MIL/uL — ABNORMAL LOW (ref 4.22–5.81)
RDW: 15 % (ref 11.5–15.5)
WBC: 7.9 10*3/uL (ref 4.0–10.5)
nRBC: 0 % (ref 0.0–0.2)

## 2021-03-08 LAB — GLUCOSE, CAPILLARY
Glucose-Capillary: 216 mg/dL — ABNORMAL HIGH (ref 70–99)
Glucose-Capillary: 189 mg/dL — ABNORMAL HIGH (ref 70–99)
Glucose-Capillary: 162 mg/dL — ABNORMAL HIGH (ref 70–99)
Glucose-Capillary: 119 mg/dL — ABNORMAL HIGH (ref 70–99)

## 2021-03-08 LAB — BASIC METABOLIC PANEL
Anion gap: 8 (ref 5–15)
BUN: 41 mg/dL — ABNORMAL HIGH (ref 6–20)
CO2: 23 mmol/L (ref 22–32)
Calcium: 8.5 mg/dL — ABNORMAL LOW (ref 8.9–10.3)
Chloride: 100 mmol/L (ref 98–111)
Creatinine, Ser: 1.58 mg/dL — ABNORMAL HIGH (ref 0.61–1.24)
GFR, Estimated: 54 mL/min — ABNORMAL LOW (ref >60)
Glucose, Bld: 213 mg/dL — ABNORMAL HIGH (ref 70–99)
Potassium: 4.5 mmol/L (ref 3.5–5.1)
Sodium: 131 mmol/L — ABNORMAL LOW (ref 135–145)

## 2021-03-08 LAB — IRON AND TIBC
Iron: 44 ug/dL — ABNORMAL LOW (ref 45–182)
Saturation Ratios: 13 % — ABNORMAL LOW (ref 17.9–39.5)
TIBC: 351 ug/dL (ref 250–450)
UIBC: 307 ug/dL

## 2021-03-08 LAB — MAGNESIUM: Magnesium: 1.9 mg/dL (ref 1.7–2.4)

## 2021-03-08 LAB — FERRITIN: Ferritin: 96 ng/mL (ref 24–336)

## 2021-03-08 LAB — FOLATE: Folate: 8.5 ng/mL (ref >5.9)

## 2021-03-08 LAB — VITAMIN B12: Vitamin B-12: 172 pg/mL — ABNORMAL LOW (ref 180–914)

## 2021-03-08 MED ORDER — CYANOCOBALAMIN 1000 MCG/ML IJ SOLN
1000.0000 ug | Freq: Once | INTRAMUSCULAR | Status: AC
  Administered 2021-03-08: 1000 ug via INTRAMUSCULAR
  Filled 2021-03-08: qty 1

## 2021-03-08 MED ORDER — INSULIN GLARGINE 100 UNIT/ML ~~LOC~~ SOLN
46.0000 [IU] | Freq: Two times a day (BID) | SUBCUTANEOUS | Status: DC
  Administered 2021-03-08 (×2): 46 [IU] via SUBCUTANEOUS
  Filled 2021-03-08 (×5): qty 0.46

## 2021-03-08 MED ORDER — INSULIN ASPART 100 UNIT/ML ~~LOC~~ SOLN
12.0000 [IU] | Freq: Three times a day (TID) | SUBCUTANEOUS | Status: DC
  Administered 2021-03-08 – 2021-03-11 (×9): 12 [IU] via SUBCUTANEOUS

## 2021-03-08 NOTE — Progress Notes (Signed)
   No change in left LE, edema and erythema Plantar small opening without erythema or drainage Plan for angiogram later this week.    Sever cellulitis with DM and peripheral neuropathy Cont. Vanco IV Pain control  Mosetta Pigeon PA-C

## 2021-03-08 NOTE — Plan of Care (Signed)

## 2021-03-08 NOTE — Progress Notes (Signed)
HD#2 SUBJECTIVE:  Overnight Events: None  Patient states that he did not sleep well last night as he continues to have lower extremity pain.  I encouraged him to reach out to the nurse regarding his pain medicine as he has medicine available for him.-Spoke to the nurse to make sure that she knew to asking him if he needs his pain medicine.  Otherwise he denies any other acute concerns.  He denies any chest pain, shortness of breath.  OBJECTIVE:  Vital Signs: Vitals:   03/07/21 1251 03/07/21 1829 03/07/21 2322 03/08/21 0450  BP: 100/83 111/80 96/62 103/71  Pulse: 86 70 80 71  Resp: 16 16 19 18   Temp: 98.6 F (37 C) 98.2 F (36.8 C) 97.6 F (36.4 C) 97.8 F (36.6 C)  TempSrc: Oral Oral Oral Oral  SpO2: 95% 97% 99% 97%  Weight:    (!) 173.6 kg  Height:       Supplemental O2: Room Air SpO2: 97 %  Filed Weights   03/06/21 0552 03/07/21 0417 03/08/21 0450  Weight: (!) 169.2 kg (!) 169.5 kg (!) 173.6 kg     Intake/Output Summary (Last 24 hours) at 03/08/2021 0751 Last data filed at 03/07/2021 2325 Gross per 24 hour  Intake 1060.54 ml  Output -  Net 1060.54 ml   Net IO Since Admission: 2,416.08 mL [03/08/21 0751]  Physical Exam: General: Patient is morbidly obese. He appears well. No acute distress. HENT: Poor dentition. No nasal discharge. Respiratory: Lungs CTA bilaterally without wheezes, rales, or rhonchi.  Cardiovascular: Regular rate and rhythm.  Continues to have a 2 out of 6 blowing systolic murmur at the right upper sternal border.  Radial pulses are 2+ bilaterally, left dorsalis pedis 1+ right dorsalis pedis 2+.  1-2+ pitting edema bilaterally lower extremities. Musculoskeletal:  Left lower extremity wound with yellow (purulent) drainage and tenderness to palpation. Charcot foot present bilaterally, with left 4th toe and right great toe amputations.  Skin: Patient's left lower extremity is wrapped with a bandage but he continues to have significant redness  extending up to his knee there is some warmth and swelling. Patient continues to have dusky hyperpigmented lesion of his left fifth digit which is stable from yesterday.  Bilateral hands remain cool to the touch with normal radial pulses bilaterally Neurological:  Patient sensation remains decreased in his lower extremities and his hands with review of systems positive for neuropathic pain  Patient Lines/Drains/Airways Status    Active Line/Drains/Airways    Name Placement date Placement time Site Days   Peripheral IV 03/07/21 Anterior;Right Wrist 03/07/21  1657  Wrist  1   Wound / Incision (Open or Dehisced) 03/05/21 Venous stasis ulcer Foot Left 03/05/21  1700  Foot  3          Recent Labs    03/07/21 1614 03/07/21 2110 03/08/21 0603  GLUCAP 139* 199* 216*     Pertinent Labs: CBC Latest Ref Rng & Units 03/08/2021 03/07/2021 03/06/2021  WBC 4.0 - 10.5 K/uL 7.9 11.6(H) 9.2  Hemoglobin 13.0 - 17.0 g/dL 10.2(L) 10.9(L) 10.8(L)  Hematocrit 39.0 - 52.0 % 33.1(L) 35.1(L) 35.9(L)  Platelets 150 - 400 K/uL 287 312 319    CMP Latest Ref Rng & Units 03/08/2021 03/07/2021 03/06/2021  Glucose 70 - 99 mg/dL 03/08/2021) 893(Y) -  BUN 6 - 20 mg/dL 101(B) 51(W) -  Creatinine 0.61 - 1.24 mg/dL 25(E) 5.27(P) -  Sodium 135 - 145 mmol/L 131(L) 135 -  Potassium 3.5 - 5.1  mmol/L 4.5 4.8 5.2(H)  Chloride 98 - 111 mmol/L 100 103 -  CO2 22 - 32 mmol/L 23 25 -  Calcium 8.9 - 10.3 mg/dL 7.4(B) 4.4(H) -  Total Protein 6.5 - 8.1 g/dL - - -  Total Bilirubin 0.3 - 1.2 mg/dL - - -  Alkaline Phos 38 - 126 U/L - - -  AST 15 - 41 U/L - - -  ALT 0 - 44 U/L - - -    Imaging: No results found.  ASSESSMENT/PLAN:   Active Problems:   Cellulitis   Cellulitis of left leg   Purulent LLE Cellulitis, multifactorial  DM with microvascular disease PAD s/p LLE stent to SFA and popliteal arteries (09/2020),  Distal SFA occlusion (50-74%)(TBI on 02/2020) Ulceration of the right 5th digit, Concerning for  Ischemia His cellulitis and LLE wound appears stable from yesterday with minimal improvement.  His leukocytosis has resolved.  He has been afebrile, normal heart rate, and normotensive.  He appears to have low blood pressures at baseline which is consistent with his readings here in the hospital.  His oxygen saturations have been stable.  There is no current systemic signs of infection. -Continue vancomycin daily -Continue ASA and Plavix and statin -Continue wound care daily -Appreciate vascular surgery's assistance and recommendations -Plan for arteriogram next week -Continue pain management  Uncontrolled IDDM Type II  Blood sugars still remain high despite increased insulin. -Increase Lantus to  units twice daily -Continue SSI resistant and increase NovoLog to 12 units 3 times daily if greater than 5% of meals eaten -Continue CBG monitoring  HFpEF, Stable Chronic Venous Stasis  Patient's volume status remained stable.  He was given 8 hours worth of lactated Ringer's yesterday due to concern for soft blood pressures and increased kidney function.  His Lasix was also held yesterday.  As result this morning his kidney function does appear to be improved.  Although he remains to have low blood pressures which appears to be baseline for him.  He is not having any signs or symptoms of respiratory distress and his lungs are clear.  He does have some lower extremity edema which has been stable since yesterday. He is up almost 1 L in 24 hours.  He will likely need to restart his Lasix tomorrow.  We will continue to hold off on his home Coreg and lisinopril due to low blood pressures and consider starting midodrine if he becomes hypotensive. -We will restart home Lasix tomorrow -Continue strict ins and outs and daily weights -We will restart his Coreg and lisinopril when possible depending on his blood pressure.  AKI on CKD Stage II Patient's kidney function improved with holding his Lasix and  giving IV fluids.  He is at 1.5 which is close to his baseline of 1.3.  We will reassess his volume status tomorrow and restart his Lasix as needed -Continue to monitor kidney function daily -Pleat electrolytes as needed.  Hx of Hypotension He is normotensive with soft blood pressures of 100/83.  Continue to hold Coreg and Lasix.  Patient's blood pressure remained stable despite fluids and holding Lasix.  This appears to be close to his baseline blood pressure.  Will consider restarting his home midodrine if pressures remain low. -Hold lisinopril and Coreg -Consider restarting midodrine  Hx of CAD s/p CABG in 2021 No chest pain or shortness of breath -Continue aspirin -Continue statin  Best Practice: Diet: Cardiac diet IVF: PO intake VTE: Enoxaparin  Code: Full PT/OT: Pending DISPO: Pending  Anticipated discharge in 3-4 days pending improvement and possible vascular procedure.  Chari Manning, D.O.  Internal Medicine Resident, PGY-2 [facility]  Pager: [phone] 7:51 AM, 03/08/2021   Please contact the on call pager after 5 pm and on weekends at [phone].

## 2021-03-08 NOTE — Progress Notes (Signed)
Pharmacy Antibiotic Note  Joseph [name] is a 50 y.o. male admitted on 03/04/2021 with wound infection.  Pharmacy has been consulted for vancomycin and cefepime dosing. Patient was admitted 3/2 for LLE cellulitis and treated with 14 days of doxycycline and Keflex.   Today, patient is aferie with WBC wnl. Currently treated with vancomycin. Scr has bounced 1.76-1.5 during this admission. Will likely want to obtain levels early this week if continuing on vanc. Per vascular surgery, patient with no change in left LE, edema, and erythema. Planning for angiogram later this week.   Plan: Continue Vancomycin 1000 mg IV every 12 hours (eAUC 435, AUC goal 400-550, Scr 1.73) - Monitor renal function, clinical status, C&S, vanc levels as indicated  Temp (24hrs), Avg:98.2 F (36.8 C), Min:97.6 F (36.4 C), Max:98.6 F (37 C)  Recent Labs  Lab 03/04/21 2035 03/04/21 2233 03/05/21 0230 03/06/21 0155 03/07/21 0034 03/08/21 0037  WBC 8.7  --  8.7 9.2 11.6* 7.9  CREATININE 1.76*  --  1.73* 1.50* 1.71* 1.58*  LATICACIDVEN 2.2* 1.3  --   --   --   --     Estimated Creatinine Clearance: 100.5 mL/min (A) (by C-G formula based on SCr of 1.58 mg/dL (H)).    Allergies  Allergen Reactions  . Sulfa Antibiotics Other (See Comments)    Headaches     Antimicrobials this admission: PTA Keflex (3/3-3/16) PTA Doxycycline (3/3-3/16) 3/17 Cefepime >> 3/19 3/17 Vancomycin >>  Dose adjustments this admission: N/A  Microbiology results: 3/7 Bcx: neg  Thank you for allowing pharmacy to be a part of this patient's care.  Trixie Rude, PharmD PGY1 Acute Care Pharmacy Resident 03/08/2021 11:23 AM  Please check [email or web address] for unit-specific pharmacy phone numbers.

## 2021-03-08 NOTE — Progress Notes (Signed)
[facility]  Subjective  - feels ok wants to go home soon   Objective 103/71 71 97.8 F (36.6 C) (Oral) 18 97%  Intake/Output Summary (Last 24 hours) at 03/08/2021 1124 Last data filed at 03/07/2021 2325 Gross per 24 hour  Intake 820.54 ml  Output --  Net 820.54 ml   Small ulcer slowly healing left 2nd finger tip 2+ left radial pulse Absent right brachial and radial pulse Slightly less edema and tenderness left leg  Assessment/Planning: Left leg cellulitis and ulcer maybe slightly less edema today.  Arteriogram Tuesday  Small ulcer left finger tip palpable radial pulse may have some small vessel disease in hand protect fingers from trauma, no further workup   Right UE no palpable brachial or radial pulse but really no symtpoms on this side.  His leg is the more pressing issue.  No further workup unless he is more symptomatic then would get UE duplex   Fabienne Bruns 03/08/2021 11:24 AM --  Laboratory Lab Results: Recent Labs    03/07/21 0034 03/08/21 0037  WBC 11.6* 7.9  HGB 10.9* 10.2*  HCT 35.1* 33.1*  PLT 312 287   BMET Recent Labs    03/07/21 0034 03/08/21 0037  NA 135 131*  K 4.8 4.5  CL 103 100  CO2 25 23  GLUCOSE 177* 213*  BUN 39* 41*  CREATININE 1.71* 1.58*  CALCIUM 8.7* 8.5*    COAG Lab Results  Component Value Date   INR 1.5 (H) 07/24/2020   INR 1.3 (H) 07/19/2020   INR 5.85 (HH) 07/29/2016   No results found for: PTT

## 2021-03-09 DIAGNOSIS — E11628 Type 2 diabetes mellitus with other skin complications: Secondary | ICD-10-CM | POA: Diagnosis not present

## 2021-03-09 DIAGNOSIS — L03116 Cellulitis of left lower limb: Secondary | ICD-10-CM | POA: Diagnosis not present

## 2021-03-09 DIAGNOSIS — N182 Chronic kidney disease, stage 2 (mild): Secondary | ICD-10-CM | POA: Diagnosis not present

## 2021-03-09 DIAGNOSIS — E1159 Type 2 diabetes mellitus with other circulatory complications: Secondary | ICD-10-CM | POA: Diagnosis not present

## 2021-03-09 LAB — MAGNESIUM: Magnesium: 2 mg/dL (ref 1.7–2.4)

## 2021-03-09 LAB — BASIC METABOLIC PANEL
Anion gap: 7 (ref 5–15)
BUN: 35 mg/dL — ABNORMAL HIGH (ref 6–20)
CO2: 25 mmol/L (ref 22–32)
Calcium: 9.2 mg/dL (ref 8.9–10.3)
Chloride: 103 mmol/L (ref 98–111)
Creatinine, Ser: 1.47 mg/dL — ABNORMAL HIGH (ref 0.61–1.24)
GFR, Estimated: 59 mL/min — ABNORMAL LOW (ref >60)
Glucose, Bld: 274 mg/dL — ABNORMAL HIGH (ref 70–99)
Potassium: 4.7 mmol/L (ref 3.5–5.1)
Sodium: 135 mmol/L (ref 135–145)

## 2021-03-09 LAB — CBC
HCT: 35.4 % — ABNORMAL LOW (ref 39.0–52.0)
Hemoglobin: 11 g/dL — ABNORMAL LOW (ref 13.0–17.0)
MCH: 25.3 pg — ABNORMAL LOW (ref 26.0–34.0)
MCHC: 31.1 g/dL (ref 30.0–36.0)
MCV: 81.4 fL (ref 80.0–100.0)
Platelets: 308 10*3/uL (ref 150–400)
RBC: 4.35 MIL/uL (ref 4.22–5.81)
RDW: 15.2 % (ref 11.5–15.5)
WBC: 6.2 10*3/uL (ref 4.0–10.5)
nRBC: 0 % (ref 0.0–0.2)

## 2021-03-09 LAB — GLUCOSE, CAPILLARY
Glucose-Capillary: 270 mg/dL — ABNORMAL HIGH (ref 70–99)
Glucose-Capillary: 160 mg/dL — ABNORMAL HIGH (ref 70–99)
Glucose-Capillary: 155 mg/dL — ABNORMAL HIGH (ref 70–99)
Glucose-Capillary: 148 mg/dL — ABNORMAL HIGH (ref 70–99)

## 2021-03-09 MED ORDER — MIDODRINE HCL 5 MG PO TABS
5.0000 mg | ORAL_TABLET | Freq: Three times a day (TID) | ORAL | Status: DC
  Administered 2021-03-09 – 2021-03-14 (×14): 5 mg via ORAL
  Filled 2021-03-09 (×14): qty 1

## 2021-03-09 MED ORDER — INSULIN GLARGINE 100 UNIT/ML ~~LOC~~ SOLN
46.0000 [IU] | Freq: Every day | SUBCUTANEOUS | Status: DC
  Administered 2021-03-09 – 2021-03-14 (×5): 46 [IU] via SUBCUTANEOUS
  Filled 2021-03-09 (×6): qty 0.46

## 2021-03-09 MED ORDER — VANCOMYCIN HCL 1500 MG/300ML IV SOLN
1500.0000 mg | Freq: Two times a day (BID) | INTRAVENOUS | Status: DC
  Administered 2021-03-09 – 2021-03-11 (×4): 1500 mg via INTRAVENOUS
  Filled 2021-03-09 (×4): qty 300

## 2021-03-09 MED ORDER — INSULIN GLARGINE 100 UNIT/ML ~~LOC~~ SOLN
50.0000 [IU] | Freq: Every day | SUBCUTANEOUS | Status: DC
  Administered 2021-03-09: 50 [IU] via SUBCUTANEOUS
  Filled 2021-03-09 (×2): qty 0.5

## 2021-03-09 NOTE — Progress Notes (Signed)
Occupational Therapy Treatment Patient Details Name: Joseph [name] MRN: [id number] DOB: 04-Jun-1971 Today's Date: 03/09/2021    History of present illness Pt is a 50 y/o male presesnting on 3/16 secondary to increased pain and swelling in LLE and L foot wound. Pt with recent admission secondary to the same. Thought to be secondary to cellulitis; imaging negative for osteomyelitis. PMH includes L 4th toe amputation, DM, CAD s/p CABG, and DVT/PE.   OT comments  Pt making steady progress towards OT goals this session. Session focus on functional mobility as precursor to higher level BADLs. Pt continues to present with increased pain, decreased activity tolerance and decreased ability to car for self. Pt currently requires supervision - set- up assist for LB ADLs and is able to complete functional mobility greater than a household distance with [facility] with gross supervision, pt did require seated rest break at halfway mark d/t increased pain and fatigue. Pt would continue to benefit from skilled occupational therapy while admitted and after d/c to address the below listed limitations in order to improve overall functional mobility and facilitate independence with BADL participation. DC plan remains appropriate, will follow acutely per POC.     Follow Up Recommendations  No OT follow up;Supervision - Intermittent    Equipment Recommendations  None recommended by OT;Other (comment) (Reports tub bench will not fit in his bathroom)    Recommendations for Other Services      Precautions / Restrictions Precautions Precautions: Fall Restrictions Weight Bearing Restrictions: No       Mobility Bed Mobility               General bed mobility comments: sitting EOB upon arrival and returned to EOB    Transfers Overall transfer level: Needs assistance Equipment used: Straight cane Transfers: Sit to/from Stand Sit to Stand: Min guard         General transfer comment: Min guard for  safety. No physical assist required. pt prefers to use cane on L side    Balance Overall balance assessment: Mild deficits observed, not formally tested                                         ADL either performed or assessed with clinical judgement   ADL Overall ADL's : Needs assistance/impaired             Lower Body Bathing: Set up Lower Body Bathing Details (indicate cue type and reason): simulated via LB dressing with pt able to don socks with set- up assist from long sitting     Lower Body Dressing: Set up Lower Body Dressing Details (indicate cue type and reason): pt able to don socks from long sitting with set- up assist Toilet Transfer: Supervision/safety;Ambulation Toilet Transfer Details (indicate cue type and reason): simlated via functional mobility with straight cane and supervision assist         Functional mobility during ADLs: Supervision/safety General ADL Comments: pt continues to present with increased pain ( LLE, R shoulder, R hip and bilateral knees), decreased activity tolerance and impaired ability to care for self     Vision       Perception     Praxis      Cognition Arousal/Alertness: Awake/alert Behavior During Therapy: [facility] for tasks assessed/performed Overall Cognitive Status: No family/caregiver present to determine baseline cognitive functioning  Exercises     Shoulder Instructions       General Comments  VSS    Pertinent Vitals/ Pain       Pain Score: 9  Pain Location: LLE, R shoulder, R hip and bilateral knees Pain Descriptors / Indicators: Grimacing;Guarding Pain Intervention(s): Monitored during session;Repositioned;Limited activity within patient's tolerance  Home Living                                          Prior Functioning/Environment              Frequency  Min 2X/week        Progress Toward Goals  OT  Goals(current goals can now be found in the care plan section)  Progress towards OT goals: Progressing toward goals  Acute Rehab OT Goals Patient Stated Goal: To return home OT Goal Formulation: With patient Time For Goal Achievement: 03/20/21 Potential to Achieve Goals: Good  Plan Discharge plan remains appropriate;Frequency remains appropriate    Co-evaluation                 AM-PAC OT "6 Clicks" Daily Activity     Outcome Measure   Help from another person eating meals?: None Help from another person taking care of personal grooming?: A Little Help from another person toileting, which includes using toliet, bedpan, or urinal?: A Little Help from another person bathing (including washing, rinsing, drying)?: A Little Help from another person to put on and taking off regular upper body clothing?: None Help from another person to put on and taking off regular lower body clothing?: None 6 Click Score: 21    End of Session Equipment Utilized During Treatment: Other (comment) (straight cane)  OT Visit Diagnosis: Pain Pain - Right/Left: Left Pain - part of body: Leg;Hip (R shoulder, knees)   Activity Tolerance Patient tolerated treatment well   Patient Left in bed;with call bell/phone within reach   Nurse Communication Mobility status        Time: 5638-9373 OT Time Calculation (min): 24 min  Charges: OT General Charges $OT Visit: 1 Visit OT Treatments $Self Care/Home Management : 8-22 mins $Therapeutic Activity: 8-22 mins Lenor Derrick., COTA/L Acute Rehabilitation Services [phone]    Barron Schmid 03/09/2021, 4:55 PM

## 2021-03-09 NOTE — Progress Notes (Cosign Needed)
  Progress Note    03/09/2021 7:52 AM * No surgery found *  Subjective:  No complaints. Denies hand pain or numbness.   Vitals:   03/08/21 2300 03/09/21 0507  BP: 108/90 103/85  Pulse: 84 75  Resp: 19 18  Temp: 97.9 F (36.6 C) 97.6 F (36.4 C)  SpO2: 100% 97%    Physical Exam: General appearance: Awake, alert in no apparent distress Cardiac: Heart rate and rhythm are regular Respirations: Nonlabored Extremities: LLE: significant edema. Left lower leg dressing dry and intact. Dry plantar ulcer. Foot warm. Decreased sensation. +motor   CBC    Component Value Date/Time   WBC 6.2 03/09/2021 0656   RBC 4.35 03/09/2021 0656   HGB 11.0 (L) 03/09/2021 0656   HGB 14.9 05/25/2019 1613   HCT 35.4 (L) 03/09/2021 0656   HCT 43.9 05/25/2019 1613   PLT 308 03/09/2021 0656   PLT 285 05/25/2019 1613   MCV 81.4 03/09/2021 0656   MCV 85 05/25/2019 1613   MCH 25.3 (L) 03/09/2021 0656   MCHC 31.1 03/09/2021 0656   RDW 15.2 03/09/2021 0656   RDW 12.9 05/25/2019 1613   LYMPHSABS 2.6 03/04/2021 2035   LYMPHSABS 3.1 05/25/2019 1613   MONOABS 0.8 03/04/2021 2035   EOSABS 0.2 03/04/2021 2035   EOSABS 0.4 05/25/2019 1613   BASOSABS 0.1 03/04/2021 2035   BASOSABS 0.1 05/25/2019 1613    BMET    Component Value Date/Time   NA 131 (L) 03/08/2021 0037   NA 140 11/20/2020 1253   K 4.5 03/08/2021 0037   CL 100 03/08/2021 0037   CO2 23 03/08/2021 0037   GLUCOSE 213 (H) 03/08/2021 0037   BUN 41 (H) 03/08/2021 0037   BUN 30 (H) 11/20/2020 1253   CREATININE 1.58 (H) 03/08/2021 0037   CREATININE 1.21 09/29/2016 1134   CALCIUM 8.5 (L) 03/08/2021 0037   GFRNONAA 54 (L) 03/08/2021 0037   GFRAA 72 11/20/2020 1253    No intake or output data in the 24 hours ending 03/09/21 0752  HOSPITAL MEDICATIONS Scheduled Meds: . aspirin EC  81 mg Oral Daily  . atorvastatin  80 mg Oral q1800  . buPROPion  150 mg Oral BID  . clopidogrel  75 mg Oral Q breakfast  . enoxaparin (LOVENOX) injection   80 mg Subcutaneous Q24H  . escitalopram  10 mg Oral Daily  . fenofibrate  160 mg Oral Daily  . insulin aspart  0-20 Units Subcutaneous TID WC  . insulin aspart  12 Units Subcutaneous TID WC  . insulin glargine  46 Units Subcutaneous Daily  . insulin glargine  50 Units Subcutaneous QHS  . mupirocin cream   Topical Daily  . oxyCODONE  15 mg Oral 5 X Daily  . pantoprazole  40 mg Oral Daily  . pregabalin  150 mg Oral QHS  . pregabalin  300 mg Oral Daily  . sodium chloride flush  3 mL Intravenous Q12H   Continuous Infusions: . vancomycin 1,000 mg (03/08/21 2249)   PRN Meds:.acetaminophen **OR** acetaminophen, ALPRAZolam, oxyCODONE, promethazine, senna-docusate  Assessment and Plan: 50 year old diabetic male with history of previous toe amputations presents with LLE cellulitis and left foot plantar ulcer. Plan is for aortogram with run-off tomorrow. Serum Cr 1.58 yesterday. NPO after MN  DVT prophy: Lovenox  Possible UE small vessel disease. Stable.   Wendi Maya, PA-C [facility] [phone] 03/09/2021  7:52 AM

## 2021-03-09 NOTE — Progress Notes (Addendum)
PT Cancellation Note  Patient Details Name: Joseph [name] MRN: [id number] DOB: 01-28-1971   Cancelled Treatment:    Reason Eval/Treat Not Completed: Other (comment);Pain limiting ability to participate made multiple attempts to see patient- on first attempt he had a visitor and asked therapy to return after visitor left, on second attempt he reported his pain was too high and refused even low level in room activities. Will attempt to return later if time/schedule allow, otherwise will f/u when medically ready after procedure tomorrow.    [name], DPT, PN1   Supplemental Physical Therapist [facility]    Pager [phone] Acute Rehab Office [phone]

## 2021-03-09 NOTE — Progress Notes (Addendum)
  Progress Note    03/09/2021 7:54 AM  Subjective:  L leg and foot pain   Vitals:   03/08/21 2300 03/09/21 0507  BP: 108/90 103/85  Pulse: 84 75  Resp: 19 18  Temp: 97.9 F (36.6 C) 97.6 F (36.4 C)  SpO2: 100% 97%   Physical Exam: Lungs:  Non labored Extremities:  L foot warm; redness of LLE improved since last week Neurologic: A&O  CBC    Component Value Date/Time   WBC 6.2 03/09/2021 0656   RBC 4.35 03/09/2021 0656   HGB 11.0 (L) 03/09/2021 0656   HGB 14.9 05/25/2019 1613   HCT 35.4 (L) 03/09/2021 0656   HCT 43.9 05/25/2019 1613   PLT 308 03/09/2021 0656   PLT 285 05/25/2019 1613   MCV 81.4 03/09/2021 0656   MCV 85 05/25/2019 1613   MCH 25.3 (L) 03/09/2021 0656   MCHC 31.1 03/09/2021 0656   RDW 15.2 03/09/2021 0656   RDW 12.9 05/25/2019 1613   LYMPHSABS 2.6 03/04/2021 2035   LYMPHSABS 3.1 05/25/2019 1613   MONOABS 0.8 03/04/2021 2035   EOSABS 0.2 03/04/2021 2035   EOSABS 0.4 05/25/2019 1613   BASOSABS 0.1 03/04/2021 2035   BASOSABS 0.1 05/25/2019 1613    BMET    Component Value Date/Time   NA 131 (L) 03/08/2021 0037   NA 140 11/20/2020 1253   K 4.5 03/08/2021 0037   CL 100 03/08/2021 0037   CO2 23 03/08/2021 0037   GLUCOSE 213 (H) 03/08/2021 0037   BUN 41 (H) 03/08/2021 0037   BUN 30 (H) 11/20/2020 1253   CREATININE 1.58 (H) 03/08/2021 0037   CREATININE 1.21 09/29/2016 1134   CALCIUM 8.5 (L) 03/08/2021 0037   GFRNONAA 54 (L) 03/08/2021 0037   GFRAA 72 11/20/2020 1253    INR    Component Value Date/Time   INR 1.5 (H) 07/24/2020 1652     Intake/Output Summary (Last 24 hours) at 03/09/2021 0754 Last data filed at 03/09/2021 0700 Gross per 24 hour  Intake 360 ml  Output 2000 ml  Net -1640 ml     Assessment/Plan:  50 y.o. male  With LLE cellulitis  L foot warm; popliteal stent patent based on duplex Plan is for L leg arteriogram and possible intervention tomorrow with Dr. Myra Gianotti All questions answered and patient willing to  proceed NPO past midnight Consent   Emilie Rutter, PA-C [facility] [phone] 03/09/2021 7:54 AM  I have independently interviewed and examined patient and agree with PA assessment and plan above.  He understands he has high risk for amputation in the future. Plan for angiogram with possible left lower extremity intervention tomorrow.  Gor Vestal C. Randie Heinz, MD [facility] [place] Office: [phone] Pager: [phone]

## 2021-03-09 NOTE — Progress Notes (Signed)
Pharmacy Antibiotic Note  Joseph [name] is a 50 y.o. male admitted on 03/04/2021 with wound infection.  Pharmacy has been consulted for vancomycin and cefepime dosing. Patient was admitted 3/2 for LLE cellulitis and treated with 14 days of doxycycline and Keflex.   Today, patient is afebfrile with WBC wnl. Currently treated with vancomycin. Scr has bounced 1.76-1.5 during this admission. Will likely want to obtain levels early this week if continuing on vanc.   Plan for angiogram in AM. Scr down to 1.47 will adjust vanc today. We will use trough based dosing with goal of 10-15.    Plan: Change vanc to 1.5g IV q12 Level as needed  Temp (24hrs), Avg:97.8 F (36.6 C), Min:97.6 F (36.4 C), Max:98.2 F (36.8 C)  Recent Labs  Lab 03/04/21 2035 03/04/21 2233 03/05/21 0230 03/06/21 0155 03/07/21 0034 03/08/21 0037 03/09/21 0656  WBC 8.7  --  8.7 9.2 11.6* 7.9 6.2  CREATININE 1.76*  --  1.73* 1.50* 1.71* 1.58* 1.47*  LATICACIDVEN 2.2* 1.3  --   --   --   --   --     Estimated Creatinine Clearance: 108.1 mL/min (A) (by C-G formula based on SCr of 1.47 mg/dL (H)).    Allergies  Allergen Reactions  . Sulfa Antibiotics Other (See Comments)    Headaches     Antimicrobials this admission: PTA Keflex (3/3-3/16) PTA Doxycycline (3/3-3/16) 3/17 Cefepime >> 3/19 3/17 Vancomycin >>  Dose adjustments this admission: N/A  Microbiology results:  3/2 blood>>negF  Ulyses Southward, PharmD, BCIDP, AAHIVP, CPP Infectious Disease Pharmacist 03/09/2021 9:58 AM

## 2021-03-09 NOTE — Care Management Important Message (Signed)
Important Message  Patient Details  Name: Joseph [name] MRN: [id number] Date of Birth: November 09, 1971   Medicare Important Message Given:  Yes     Dorena Bodo 03/09/2021, 3:44 PM

## 2021-03-09 NOTE — Progress Notes (Addendum)
Subjective:   [name] continues to have left leg pain with only small improvement with Roxicodone PRN. He is unable to comment on appearance although says he is getting regular dressing changes. Endorses chills overnight but denies feeling febrile. Notes his chronic CP seems to have become more frequent. Denies SOB.  Objective:  Vital signs in last 24 hours: Vitals:   03/08/21 1129 03/08/21 1742 03/08/21 2300 03/09/21 0507  BP: 106/76 97/64 108/90 103/85  Pulse: 80 78 84 75  Resp: 20 20 19 18   Temp: 97.6 F (36.4 C) 98.2 F (36.8 C) 97.9 F (36.6 C) 97.6 F (36.4 C)  TempSrc: Axillary Oral Oral Oral  SpO2: 98% 100% 100% 97%  Weight:    (!) 173.8 kg  Height:       General: Patient is morbidly obese. He appears well. No acute distress. Respiratory: Lungs CTA bilaterally without wheezes, rales, or rhonchi.  Cardiovascular: Regular rate and rhythm. There is a 2/6 blowing systolic murmur heard best along the right upper sternal border. No other m/r/g. There is 1+ bilateral lower extremity pitting edema to the knees. Hands do feel slightly warmer than yesterday, although unable to palpate radial pulse on the RUE. Unable to palpate DP pulse in bilateral lower extremities.  Musculoskeletal: LLE shin tenderness continues to improve. Charcot foot present bilaterally, with left 4th toe and right great toe amputations.  Skin: Bandage is in place over left shin wound, with minimal, non-purulent drainage. Erythema continues to slowly improve in LLE with decreased warmth.  Neurological: Sensation to light touch is absent in bilateral hands to the wrists. Patient is alert and oriented x 3. He has delayed short term recall.  Psychiatric: Pleasant and cooperative.   Assessment/Plan:  Active Problems:   Dyslipidemia   CAD -S/P PCI 11/02/19    OSA (obstructive sleep apnea)   Insulin dependent type 2 diabetes mellitus (HCC)   Anxiety disorder   PAD (peripheral artery disease) (HCC)    Cellulitis   Cellulitis of left leg  Purulent LLE Cellulitis in the setting of Multiple Diabetic Foot/Shin Wounds and Moderate PAD of the LLE s/p revascularization of the L SFA and L popliteal arteries 09/2020 Ulceration of the left 5th digit and absence of radial pulse in RUE, Concerning for Ischemia Patient remains afebrile without leukocytosis on Vancomycin. Vascular surgery did evaluate for concerns of bilateral upper extremity ischemic disease as well. Although he has a small ulcer at the tip of his left finger, radial pulses are palpable and lesion remains stable. RUE is more concerning given no palpable brachial or radial pulse, although he remains asymptomatic. Does continue to have pain in LLE although is not using PRN Roxicodone frequently.  - Vascular following; appreciate their recommendations  - Arteriogram of LLE tomorrow  - Continue to monitor bilateral UE's without intervention for now - Continue vancomycin, per pharmacy  - Continue ASA and Plavix  - Encourage use of 5mg  Roxicodone q3 hours PRN for pain - Will work to get better control of risk factors below  - Continue wound care  - Monitor CBC's daily   Uncontrolled IDDM Type II  History of diabetes on Lantus 40 units twice daily, humalog, Victoza, and Metformin at home. Sugars are under good control in the evenings; however, remained significantly elevated in the morning despite increase in BID Lantus and mealtime coverage.   - Continue Lantus 46 units in the mornings  - Increase evening Lantus to 50 units nightly  - Continue Resistant SSI  -  Continue mealtime 12 units Novolog TID if >50% meal is eaten  - Will discontinue evening 0-5 units Novolog SSI for now   - Continue CBG monitoring  - Continue home pregabalin 150 mg 3 times daily for neuropathic pain.  HFpEF, Stable Chronic Venous Stasis  Patient takes Lasix 40mg  daily at home. Creatinine has improved after fluids were given and Lasix was held. Patient continues  to have great urine volumes without diuretic although has had some slow weight gain; unclear if inputs are being documented fully. No crackles on examination. It is likely chronic venous stasis changes are largely contributing to his bilateral LE edema.  - Will continue to hold Lasix for now - Strict I&O w/ daily weights  - Plan to restart Lasix PO tomorrow if there are signs of volume overload  - Continue to hold coreg and lisinopril for now given soft pressures   AKI on CKD Stage II Kidney function continues to improve, closer to baseline creatinine ~1.3.  - Continue strict I&O - Continue to monitor daily renal function   Hx of Hypotension Pressures remain soft but stable.   - Restarted midodrine in setting of increased angina and significant vascular disease - Continue to hold home Coreg and lisinopril for now - Close monitoring   Atypical Chest Pain Hx of CAD s/p CABG in 2021 Patient notes history of atypical CP that has occurred daily since his CABG and more frequently recently, likely in the setting of severe vascular disease and decreased pressures in the hospital.   - Will aim to keep blood pressures closer to target of 120/80 - Continue ASA/Plavix  - Continue to monitor   B12 Deficiency Patient received his first shot of B12 yesterday, although may have troubles obtaining injections outpatient.   - Continue weekly B12 injections while admitted - Will require follow up as outpatient  Hypomagnesemia, Resolved  Hyperkalemia, Resolved Likely in setting of Lasix use given transiently low.   - Continue to monitor daily electrolyte - Replace as needed for goal > 2 for Mg, >4 for K+  Prior to Admission Living Arrangement: Home Anticipated Discharge Location: Home (pending further vascular workup)   2022, MD 03/09/2021, 7:43 AM Pager: [phone] After 5pm on weekdays and 1pm on weekends: On Call pager [phone]

## 2021-03-10 ENCOUNTER — Inpatient Hospital Stay (HOSPITAL_COMMUNITY)
Admission: EM | Disposition: A | Discharge status: Home / Self Care | Payer: Self-pay | Source: Home / Self Care | Attending: Internal Medicine

## 2021-03-10 DIAGNOSIS — E1159 Type 2 diabetes mellitus with other circulatory complications: Secondary | ICD-10-CM | POA: Diagnosis not present

## 2021-03-10 DIAGNOSIS — N182 Chronic kidney disease, stage 2 (mild): Secondary | ICD-10-CM | POA: Diagnosis not present

## 2021-03-10 DIAGNOSIS — L03116 Cellulitis of left lower limb: Secondary | ICD-10-CM | POA: Diagnosis not present

## 2021-03-10 DIAGNOSIS — E11628 Type 2 diabetes mellitus with other skin complications: Secondary | ICD-10-CM | POA: Diagnosis not present

## 2021-03-10 LAB — MAGNESIUM: Magnesium: 1.8 mg/dL (ref 1.7–2.4)

## 2021-03-10 LAB — CBC
HCT: 34.6 % — ABNORMAL LOW (ref 39.0–52.0)
Hemoglobin: 10.7 g/dL — ABNORMAL LOW (ref 13.0–17.0)
MCH: 25.2 pg — ABNORMAL LOW (ref 26.0–34.0)
MCHC: 30.9 g/dL (ref 30.0–36.0)
MCV: 81.4 fL (ref 80.0–100.0)
Platelets: 331 10*3/uL (ref 150–400)
RBC: 4.25 MIL/uL (ref 4.22–5.81)
RDW: 15.2 % (ref 11.5–15.5)
WBC: 6.9 10*3/uL (ref 4.0–10.5)
nRBC: 0 % (ref 0.0–0.2)

## 2021-03-10 LAB — GLUCOSE, CAPILLARY
Glucose-Capillary: 176 mg/dL — ABNORMAL HIGH (ref 70–99)
Glucose-Capillary: 176 mg/dL — ABNORMAL HIGH (ref 70–99)
Glucose-Capillary: 178 mg/dL — ABNORMAL HIGH (ref 70–99)
Glucose-Capillary: 169 mg/dL — ABNORMAL HIGH (ref 70–99)

## 2021-03-10 LAB — BASIC METABOLIC PANEL
Anion gap: 6 (ref 5–15)
BUN: 36 mg/dL — ABNORMAL HIGH (ref 6–20)
CO2: 27 mmol/L (ref 22–32)
Calcium: 9 mg/dL (ref 8.9–10.3)
Chloride: 101 mmol/L (ref 98–111)
Creatinine, Ser: 1.54 mg/dL — ABNORMAL HIGH (ref 0.61–1.24)
GFR, Estimated: 56 mL/min — ABNORMAL LOW (ref >60)
Glucose, Bld: 212 mg/dL — ABNORMAL HIGH (ref 70–99)
Potassium: 4.7 mmol/L (ref 3.5–5.1)
Sodium: 134 mmol/L — ABNORMAL LOW (ref 135–145)

## 2021-03-10 LAB — METHYLMALONIC ACID, SERUM: Methylmalonic Acid, Quantitative: 726 nmol/L — ABNORMAL HIGH (ref 0–378)

## 2021-03-10 SURGERY — INVASIVE LAB ABORTED CASE

## 2021-03-10 MED ORDER — HEPARIN (PORCINE) IN NACL 1000-0.9 UT/500ML-% IV SOLN
INTRAVENOUS | Status: AC
  Filled 2021-03-10: qty 1000

## 2021-03-10 MED ORDER — FUROSEMIDE 40 MG PO TABS
40.0000 mg | ORAL_TABLET | Freq: Once | ORAL | Status: AC
  Administered 2021-03-10: 40 mg via ORAL
  Filled 2021-03-10: qty 1

## 2021-03-10 MED ORDER — INSULIN GLARGINE 100 UNIT/ML ~~LOC~~ SOLN
52.0000 [IU] | Freq: Every day | SUBCUTANEOUS | Status: DC
  Administered 2021-03-10 – 2021-03-13 (×4): 52 [IU] via SUBCUTANEOUS
  Filled 2021-03-10 (×6): qty 0.52

## 2021-03-10 MED ORDER — SODIUM CHLORIDE 0.9 % IV SOLN: INTRAVENOUS | Status: DC

## 2021-03-10 MED ORDER — LIDOCAINE HCL (PF) 1 % IJ SOLN
INTRAMUSCULAR | Status: AC
  Filled 2021-03-10: qty 30

## 2021-03-10 MED ORDER — MAGNESIUM SULFATE 2 GM/50ML IV SOLN
2.0000 g | Freq: Once | INTRAVENOUS | Status: AC
  Administered 2021-03-10: 2 g via INTRAVENOUS
  Filled 2021-03-10: qty 50

## 2021-03-10 SURGICAL SUPPLY — 7 items
KIT MICROPUNCTURE NIT STIFF (SHEATH) ×4 IMPLANT
KIT PV (KITS) ×3 IMPLANT
SHEATH PINNACLE 5F 10CM (SHEATH) ×2 IMPLANT
SYR MEDRAD MARK V 150ML (SYRINGE) ×2 IMPLANT
TRANSDUCER W/STOPCOCK (MISCELLANEOUS) ×3 IMPLANT
TRAY PV CATH (CUSTOM PROCEDURE TRAY) ×3 IMPLANT
WIRE BENTSON .035X145CM (WIRE) ×2 IMPLANT

## 2021-03-10 NOTE — Progress Notes (Signed)
Procedure cancelled as patient states that he can not lay flat because of hip pain.  Will try to schedule under general anesthesia later this week  Wells Valin Massie

## 2021-03-10 NOTE — Progress Notes (Signed)
Subjective:   Joseph [name] sitting at edge of bed. He has a headache that he states feels as though he is "hung over" and endorses fatigue. He says he continues to have pain of his left leg with increased swelling in both of his legs. He says he is urinating more than usual. He does not feel his pain medications are causing his increased fatigue. Denies any CP, palpitations, fevers, chills, nausea, vomiting, or any other symptoms.   Objective:  Vital signs in last 24 hours: Vitals:   03/09/21 1814 03/09/21 2331 03/10/21 0500 03/10/21 1207  BP: 113/85 103/89 (!) 108/96 109/80  Pulse: 79 85 74 85  Resp: 16 19 18 18   Temp: 98.3 F (36.8 C) 98.6 F (37 C) 98.1 F (36.7 C) 97.6 F (36.4 C)  TempSrc: Oral Oral Oral Oral  SpO2: 96% 100% 99% 96%  Weight:   (!) 175.3 kg   Height:       General: Patient is morbidly obese. He is tired and ill-appearing. In no acute distress. Respiratory: Lungs are CTA bilaterally without wheezes, rales, or rhonchi.  Cardiovascular: RRR. There is a 2/6 blowing systolic murmur heard best along the right upper sternal border. No other m/r/g. There is 2+ pitting edema bilaterally to the knees. There is increased warmth of the LLE and RLE is cooler to touch. Bilateral hands are warm to touch. Unable to palpate distal pulses except in the LUE, which are 1+.  Musculoskeletal: LLE is mildly tender. Charcot foot present bilaterally, with left 4th toe and right great toe amputations.  Skin: Bandage is in place over left shin wound, with minimal, non-purulent drainage. Erythema appears slightly worse than yesterday. Chronic induration changes of the LLE with peeling over the left calf, consistent with chronic venous stasis changes. Neurological: Patient is alert and oriented x 3. He has delayed short term recall.  Psychiatric: Pleasant and cooperative.   Assessment/Plan:  Active Problems:   Dyslipidemia   CAD -S/P PCI 11/02/19    OSA (obstructive sleep apnea)    Insulin dependent type 2 diabetes mellitus (HCC)   Anxiety disorder   PAD (peripheral artery disease) (HCC)   Cellulitis   Cellulitis of left leg  Purulent LLE Cellulitis in the setting of Multiple Diabetic Foot/Shin Wounds and Moderate PAD of the LLE s/p revascularization of the L SFA and L popliteal arteries 09/2020 Absence of radial pulse in RUE, with small distal lesions, Concerning for Ischemia LLE swelling, redness, and pain appear to be worse than yesterday. Vascular studies to date are concerning for stenosis of left SFA, which had previously been stented. Patient was unable to lay flat for aortogram w/ run-off today. He has not called for his PRN Roxicodone consistently. He does look more ill appearing today with increased systemic symptoms (HA, fatigue).   - Vascular following; appreciate their recommendations  - Plan to reschedule aortogram for later this week under general anesthesia  - Continue to monitor bilateral upper extremities for now, not requiring intervention - Continue vancomycin, per pharmacy  - Will check blood cultures  - Continue ASA and Plavix  - Continued to encourage use of 5mg  Roxicodone q3 hours PRN for pain - Continue wound care  - Monitor CBC's daily   Uncontrolled IDDM Type II  History of diabetes on Lantus 40 units twice daily, humalog, Victoza, and Metformin at home. Sugars are under much better control overall today with CBG's between 120-180; however, serum glucose this morning was >200. CBG's may not be 100%  reliable in setting of PVD.  - Continue Lantus 46 units in the mornings  - Increase evening Lantus to 52 units nightly  - Continue Resistant SSI  - Continue mealtime 12 units Novolog TID if >50% meal is eaten  - Hold evening SSI insulin  - Continue CBG monitoring  - Daily BMP - Continue home pregabalin 150 mg 3 times daily for neuropathic pain.  HFpEF, Stable Chronic Venous Stasis  Patient takes Lasix 40mg  daily at home. Creatinine is up  just slightly from yesterday, with baseline ~1.3. He has been urinating very well, although do not believe intakes are accurate in the setting of slow, steady weight gain throughout admission.   - Will restart PO Lasix 40mg  daily  - Assess fluid status and uptitrate Lasix as needed - Strict I&O w/ daily weights  - Continue to hold coreg and lisinopril for now given soft pressures   AKI on CKD Stage II Kidney function remains stable although elevated from baseline Cr ~1.3.  - Continue strict I&O - Continue to monitor daily renal function   Hx of Hypotension Pressures remain soft but stable. Relative hypotension may be contributing to patient's fatigue and headache in the setting of severe vascular disease.  - Continue Midodrine 5mg  TID  - Continue to hold home Coreg and lisinopril for now - May require dose increase in midodrine on Lasix  - Close monitoring   Atypical Chest Pain, Stable Hx of CAD s/p CABG in 2021 Patient notes history of atypical CP that has occurred daily since his CABG and more frequently recently, likely in the setting of severe vascular disease and decreased pressures in the hospital. Notes CP has improved today.   - Continue ASA/Plavix  - Continue to monitor   B12 Deficiency  - Continue weekly B12 injections while admitted   Hypomagnesemia Hyperkalemia, Resolved Likely in setting of Lasix use given transiently low. Magnesium lower than target of 2 at 1.8 overnight.  - Will give Mg 2g IVPB  - Continue to monitor daily electrolytes - Replace as needed for goal > 2 for Mg, >4 for K+  Prior to Admission Living Arrangement: Home Anticipated Discharge Location: Home (pending further vascular workup / PT evaluation)  , MD 03/10/2021, 1:07 PM Pager: [phone] After 5pm on weekdays and 1pm on weekends: On Call pager [phone]

## 2021-03-10 NOTE — Progress Notes (Signed)
IMTS Progress Note:   Went to re-evaluate patient at the bedside. He was much more awake and alert, denying fatigue, headaches, or other focal neurological complaints. He was embarrassed that he could not lay flat for his procedure, but states his pain is currently well controlled at rest. Informed him to please let RN know if pain is uncontrolled so we will get notified if additional pain medications are needed.   He notes the redness of his LLE continues to rise. On examination, his LLE cellulitis seems consistent with previous examinations and he remains afebrile. Discussed he will likely require prolonged stay for aortogram under general anesthesia and monitoring for wound healing thereafter. Will continue with current plan of care.  Glenford Bayley, MD 03/10/2021, 4:32 PM Pager: [phone]

## 2021-03-11 DIAGNOSIS — L03116 Cellulitis of left lower limb: Secondary | ICD-10-CM | POA: Diagnosis not present

## 2021-03-11 DIAGNOSIS — N182 Chronic kidney disease, stage 2 (mild): Secondary | ICD-10-CM | POA: Diagnosis not present

## 2021-03-11 DIAGNOSIS — E1159 Type 2 diabetes mellitus with other circulatory complications: Secondary | ICD-10-CM | POA: Diagnosis not present

## 2021-03-11 DIAGNOSIS — E11628 Type 2 diabetes mellitus with other skin complications: Secondary | ICD-10-CM | POA: Diagnosis not present

## 2021-03-11 LAB — MAGNESIUM: Magnesium: 1.8 mg/dL (ref 1.7–2.4)

## 2021-03-11 LAB — GLUCOSE, CAPILLARY
Glucose-Capillary: 166 mg/dL — ABNORMAL HIGH (ref 70–99)
Glucose-Capillary: 180 mg/dL — ABNORMAL HIGH (ref 70–99)
Glucose-Capillary: 131 mg/dL — ABNORMAL HIGH (ref 70–99)
Glucose-Capillary: 80 mg/dL (ref 70–99)

## 2021-03-11 LAB — CBC
HCT: 32.9 % — ABNORMAL LOW (ref 39.0–52.0)
Hemoglobin: 10 g/dL — ABNORMAL LOW (ref 13.0–17.0)
MCH: 24.8 pg — ABNORMAL LOW (ref 26.0–34.0)
MCHC: 30.4 g/dL (ref 30.0–36.0)
MCV: 81.6 fL (ref 80.0–100.0)
Platelets: 284 10*3/uL (ref 150–400)
RBC: 4.03 MIL/uL — ABNORMAL LOW (ref 4.22–5.81)
RDW: 15.2 % (ref 11.5–15.5)
WBC: 6.3 10*3/uL (ref 4.0–10.5)
nRBC: 0 % (ref 0.0–0.2)

## 2021-03-11 LAB — BASIC METABOLIC PANEL
Anion gap: 5 (ref 5–15)
BUN: 36 mg/dL — ABNORMAL HIGH (ref 6–20)
CO2: 26 mmol/L (ref 22–32)
Calcium: 9 mg/dL (ref 8.9–10.3)
Chloride: 105 mmol/L (ref 98–111)
Creatinine, Ser: 1.41 mg/dL — ABNORMAL HIGH (ref 0.61–1.24)
GFR, Estimated: >60 mL/min (ref >60)
Glucose, Bld: 172 mg/dL — ABNORMAL HIGH (ref 70–99)
Potassium: 4.8 mmol/L (ref 3.5–5.1)
Sodium: 136 mmol/L (ref 135–145)

## 2021-03-11 LAB — VANCOMYCIN, TROUGH: Vancomycin Tr: 26 ug/mL (ref 15–20)

## 2021-03-11 MED ORDER — FUROSEMIDE 40 MG PO TABS
40.0000 mg | ORAL_TABLET | Freq: Every day | ORAL | Status: DC
  Administered 2021-03-11 – 2021-03-14 (×4): 40 mg via ORAL
  Filled 2021-03-11 (×4): qty 1

## 2021-03-11 MED ORDER — VANCOMYCIN HCL 1500 MG/300ML IV SOLN
1500.0000 mg | INTRAVENOUS | Status: DC
  Administered 2021-03-12 – 2021-03-14 (×3): 1500 mg via INTRAVENOUS
  Filled 2021-03-11 (×5): qty 300

## 2021-03-11 MED FILL — Heparin Sod (Porcine)-NaCl IV Soln 1000 Unit/500ML-0.9%: INTRAVENOUS | Qty: 1000 | Status: AC

## 2021-03-11 MED FILL — Lidocaine HCl Local Preservative Free (PF) Inj 1%: INTRAMUSCULAR | Qty: 30 | Status: AC

## 2021-03-11 NOTE — Progress Notes (Signed)
PT Cancellation Note  Patient Details Name: Joseph [name] MRN: [id number] DOB: Mar 18, 1971   Cancelled Treatment:    Reason Eval/Treat Not Completed: Patient declined, no reason specified  politely declines gait, stair training, and even bed level exercises, giving multiple reasons as to why he cannot participate right now including pain (pre-medicated by RN),  "I can walk myself in the hall" and "I'm going to put 2x6 boards down on my stairs at home so I don't have to do any more stairs, I don't need to practice them here". Will attempt to return if time/schedule allow, otherwise will try back on next date of service.   [name], DPT, PN1   Supplemental Physical Therapist [facility]    Pager [phone] Acute Rehab Office [phone]

## 2021-03-11 NOTE — Progress Notes (Signed)
Subjective:   [name] states that parts of him are feeling better. States he got pain medications a few hours ago. Reports that his leg pain is really bad, and that he feels like his redness if getting worse. He reports that it feels like it's ripping apart from the inside. Discussed plan to do vascular procedure later this week.   Objective:  Vital signs in last 24 hours: Vitals:   03/10/21 1711 03/10/21 1814 03/10/21 2313 03/11/21 0517  BP: 102/70 103/77 119/83 (!) 158/94  Pulse: 89  78 87  Resp: 18  20 18   Temp: 97.7 F (36.5 C)  98 F (36.7 C) 97.6 F (36.4 C)  TempSrc: Oral  Oral Oral  SpO2: 98%  97% 100%  Weight:    (!) 173.9 kg  Height:       General: Middle-aged male, no acute distress, chronically ill-appearing Respiratory: CTA BL, no wheezing, rhonchi, rales Cardiac: RRR, 2/6 systolic murmur, 2+ bilateral pitting edema MSK: Left lower extremity tender to palpation Skin: Bandage over the left shin wounds, improved erythema and decreased nonpurulent drainage   Assessment/Plan:  Active Problems:   Dyslipidemia   CAD -S/P PCI 11/02/19    OSA (obstructive sleep apnea)   Insulin dependent type 2 diabetes mellitus (HCC)   Anxiety disorder   PAD (peripheral artery disease) (HCC)   Cellulitis   Cellulitis of left leg  Purulent LLE Cellulitis in the setting of Multiple Diabetic Foot/Shin Wounds and Moderate PAD of the LLE s/p revascularization of the L SFA and L popliteal arteries 09/2020 Absence of radial pulse in RUE, with small distal lesions, Concerning for Ischemia LLE swelling, redness, and pain appear to be improving from yesterday. Vascular studies to date are concerning for stenosis of left SFA, which had previously been stented. Patient was unable to lay flat for aortogram w/ run-off. He has not called for his PRN Roxicodone consistently.  Patient continues to appear chronically ill-appearing, however mildly improved from yesterday.  Vascular planning on  arteriogram later this week under general anesthesia.  - Vascular following; appreciate their recommendations  - Plan to reschedule aortogram for later this week under general anesthesia - Continue vancomycin, per pharmacy  - F/u blood cultures  - Continue ASA and Plavix  - Continued to encourage use of 5mg  Roxicodone q3 hours PRN for pain - Continue wound care  - Monitor CBC's daily    Uncontrolled IDDM Type II  History of diabetes on Lantus 40 units twice daily, humalog, Victoza, and Metformin at home.  CBGs ranging around 130-180s. Better controlled. May not be completely reliable in setting of PVD.   - Continue Lantus 46 units in the mornings  - Continue Lantus 52 units nightly  - Continue Resistant SSI  - Continue mealtime 12 units Novolog TID if >50% meal is eaten  - Hold evening SSI insulin  - Continue CBG monitoring  - Daily BMP - Continue home pregabalin 150 mg 3 times daily for neuropathic pain.  HFpEF, Stable Chronic Venous Stasis  Patient takes Lasix 40mg  daily at home. BL Cr ~1.3. He has been urinating very well, although do not believe intakes are accurate in the setting of slow, steady weight gain throughout admission.   - Continue home Lasix 40mg  daily  - Assess fluid status and uptitrate Lasix as needed - Strict I&O w/ daily weights  - Continue to hold coreg and lisinopril for now given soft pressures   AKI on CKD Stage II Kidney function remains stable  although elevated from baseline Cr ~1.3.  - Continue strict I&O - Continue to monitor daily renal function   Hx of Hypotension Pressures remain soft but stable. Relative hypotension may be contributing to patient's fatigue and headache in the setting of severe vascular disease.  - Continue Midodrine 5mg  TID  - Continue to hold home Coreg and lisinopril for now - May require dose increase in midodrine on Lasix  - Close monitoring   Atypical Chest Pain, Stable Hx of CAD s/p CABG in 2021 Patient notes  history of atypical CP that has occurred daily since his CABG and more frequently recently, likely in the setting of severe vascular disease and decreased pressures in the hospital. Denies CP today.   - Continue ASA/Plavix  - Continue to monitor   B12 Deficiency - Continue weekly B12 injections while admitted   Hypomagnesemia Hyperkalemia, Resolved Likely in setting of Lasix use given transiently low. Magnesium lower than target of 2 at 1.8 overnight.  - Will give Mg 2g IVPB  - Continue to monitor daily electrolytes - Replace as needed for goal > 2 for Mg, >4 for K+  Prior to Admission Living Arrangement: Home Anticipated Discharge Location: Home (pending further vascular workup / PT evaluation)  2022, M.D. PGY3 Pager [phone] 03/11/2021 5:50 PM After 5pm on weekdays and 1pm on weekends: On Call pager [phone]

## 2021-03-11 NOTE — Progress Notes (Signed)
Pharmacy Antibiotic Note  Joseph [name] is a 50 y.o. male admitted on 03/04/2021 with wound infection.  Pharmacy has been consulted for vancomycin and cefepime dosing. Patient was admitted 3/2 for LLE cellulitis and treated with 14 days of doxycycline and Keflex.   Today, patient is afebfrile with WBC wnl. Currently treated with vancomycin. Scr has bounced 1.76-1.5 during this admission. Will likely want to obtain levels early this week if continuing on vanc.   Plan for angiogram later this week since pt couldn't lay flat. Scr down to 1.41. Vanc trough came back at 26 today (goal 10-15). We will adjust dose.     Plan: Change vanc to 1.5g IV q24 Level as needed  Temp (24hrs), Avg:97.7 F (36.5 C), Min:97.6 F (36.4 C), Max:98 F (36.7 C)  Recent Labs  Lab 03/04/21 2035 03/04/21 2233 03/05/21 0230 03/07/21 0034 03/08/21 0037 03/09/21 0656 03/10/21 0013 03/11/21 0052 03/11/21 0851  WBC 8.7  --    < > 11.6* 7.9 6.2 6.9 6.3  --   CREATININE 1.76*  --    < > 1.71* 1.58* 1.47* 1.54* 1.41*  --   LATICACIDVEN 2.2* 1.3  --   --   --   --   --   --   --   VANCOTROUGH  --   --   --   --   --   --   --   --  26*   < > = values in this interval not displayed.    Estimated Creatinine Clearance: 112.7 mL/min (A) (by C-G formula based on SCr of 1.41 mg/dL (H)).    Allergies  Allergen Reactions  . Sulfa Antibiotics Other (See Comments)    Headaches     Antimicrobials this admission: PTA Keflex (3/3-3/16) PTA Doxycycline (3/3-3/16) 3/17 Cefepime >> 3/19 3/17 Vancomycin >>  Dose adjustments this admission: 3/23 VT 26 on 1.5g IV q12>>1.5g IV q24  Microbiology results:  3/2 blood>>negF  Ulyses Southward, PharmD, BCIDP, AAHIVP, CPP Infectious Disease Pharmacist 03/11/2021 11:18 AM

## 2021-03-11 NOTE — Progress Notes (Signed)
Date and time results received: 03/11/21   Test: Vanc troph Critical Value: 26  Name of Provider Notified: Dr. Carolin Coy first call on pager  Orders Received? Awaiting call back and further instructions.

## 2021-03-12 DIAGNOSIS — L03116 Cellulitis of left lower limb: Secondary | ICD-10-CM | POA: Diagnosis not present

## 2021-03-12 DIAGNOSIS — E1159 Type 2 diabetes mellitus with other circulatory complications: Secondary | ICD-10-CM | POA: Diagnosis not present

## 2021-03-12 DIAGNOSIS — N182 Chronic kidney disease, stage 2 (mild): Secondary | ICD-10-CM | POA: Diagnosis not present

## 2021-03-12 DIAGNOSIS — E11628 Type 2 diabetes mellitus with other skin complications: Secondary | ICD-10-CM | POA: Diagnosis not present

## 2021-03-12 LAB — CBC
HCT: 34.5 % — ABNORMAL LOW (ref 39.0–52.0)
Hemoglobin: 10.9 g/dL — ABNORMAL LOW (ref 13.0–17.0)
MCH: 25.7 pg — ABNORMAL LOW (ref 26.0–34.0)
MCHC: 31.6 g/dL (ref 30.0–36.0)
MCV: 81.4 fL (ref 80.0–100.0)
Platelets: 332 10*3/uL (ref 150–400)
RBC: 4.24 MIL/uL (ref 4.22–5.81)
RDW: 15.5 % (ref 11.5–15.5)
WBC: 7.9 10*3/uL (ref 4.0–10.5)
nRBC: 0 % (ref 0.0–0.2)

## 2021-03-12 LAB — BASIC METABOLIC PANEL
Anion gap: 6 (ref 5–15)
BUN: 33 mg/dL — ABNORMAL HIGH (ref 6–20)
CO2: 28 mmol/L (ref 22–32)
Calcium: 9.2 mg/dL (ref 8.9–10.3)
Chloride: 104 mmol/L (ref 98–111)
Creatinine, Ser: 1.38 mg/dL — ABNORMAL HIGH (ref 0.61–1.24)
GFR, Estimated: >60 mL/min (ref >60)
Glucose, Bld: 132 mg/dL — ABNORMAL HIGH (ref 70–99)
Potassium: 4.8 mmol/L (ref 3.5–5.1)
Sodium: 138 mmol/L (ref 135–145)

## 2021-03-12 LAB — GLUCOSE, CAPILLARY
Glucose-Capillary: 147 mg/dL — ABNORMAL HIGH (ref 70–99)
Glucose-Capillary: 135 mg/dL — ABNORMAL HIGH (ref 70–99)
Glucose-Capillary: 284 mg/dL — ABNORMAL HIGH (ref 70–99)
Glucose-Capillary: 212 mg/dL — ABNORMAL HIGH (ref 70–99)

## 2021-03-12 MED ORDER — INSULIN ASPART 100 UNIT/ML ~~LOC~~ SOLN
10.0000 [IU] | Freq: Three times a day (TID) | SUBCUTANEOUS | Status: DC
  Administered 2021-03-12 (×3): 10 [IU] via SUBCUTANEOUS

## 2021-03-12 MED ORDER — CYANOCOBALAMIN 1000 MCG/ML IJ SOLN: 1000.0000 ug | Freq: Once | INTRAMUSCULAR | Status: DC

## 2021-03-12 MED ORDER — OXYCODONE HCL 5 MG PO TABS
5.0000 mg | ORAL_TABLET | Freq: Four times a day (QID) | ORAL | Status: DC
  Administered 2021-03-12 – 2021-03-14 (×7): 5 mg via ORAL
  Filled 2021-03-12 (×8): qty 1

## 2021-03-12 MED ORDER — MECLIZINE HCL 25 MG PO TABS
25.0000 mg | ORAL_TABLET | Freq: Three times a day (TID) | ORAL | Status: DC | PRN
  Administered 2021-03-12: 25 mg via ORAL
  Filled 2021-03-12: qty 1

## 2021-03-12 MED ORDER — OXYCODONE HCL 5 MG PO TABS
5.0000 mg | ORAL_TABLET | ORAL | Status: DC | PRN
  Administered 2021-03-12: 5 mg via ORAL
  Filled 2021-03-12: qty 1

## 2021-03-12 NOTE — Progress Notes (Signed)
Plan for aortogram in the OR tomorrow with Dr. Lenell Antu. Orders placed for NPO and BMP in am.

## 2021-03-12 NOTE — Progress Notes (Signed)
Pt states that he takes meclizine 25mg  prn at home for vertigo. Pt states, "I feel a little wobbly". Provider paged, awaiting orders.

## 2021-03-12 NOTE — Progress Notes (Signed)
Occupational Therapy Treatment Patient Details Name: Joseph [name] MRN: [id number] DOB: 22-Dec-1970 Today's Date: 03/12/2021    History of present illness Pt is a 50 y/o male presesnting on 3/16 secondary to increased pain and swelling in LLE and L foot wound. Pt with recent admission secondary to the same. Thought to be secondary to cellulitis; imaging negative for osteomyelitis. PMH includes L 4th toe amputation, DM, CAD s/p CABG, and DVT/PE.   OT comments  OT treatment session with focus on self-care re-education, education on safety with DME for functional mobility, and activity tolerance. Patient met seated in straight back chair stating he was attempting to return to bed after toileting but "didn't make it" 2/2 pain in LLE. RN notified. Patient also appears to be falling asleep during treatment session with difficulty keeping eyes open. Patient states this is 2/2 fatigue. Noted improvement in alertness near conclusion of session. Education on supervision A for functional mobility to and from commode for safety. Patient expressed verbal understanding. Patient continues to be limited by deficits listed below 2/2 diagnosis above and would benefit from continued acute OT services in prep for safe d/c home with family.    Follow Up Recommendations  No OT follow up;Supervision - Intermittent    Equipment Recommendations  None recommended by OT    Recommendations for Other Services      Precautions / Restrictions Precautions Precautions: Fall Precaution Comments: Reports 2 falls (unable to recall when or why he fell). Restrictions Weight Bearing Restrictions: No       Mobility Bed Mobility               General bed mobility comments: Patient seated in straight back chair upon entry.    Transfers Overall transfer level: Needs assistance Equipment used: Straight cane Transfers: Sit to/from Stand Sit to Stand: Min guard         General transfer comment: Min guard for  safety. No physical assist required. pt prefers to use cane on L side    Balance Overall balance assessment: Mild deficits observed, not formally tested                                         ADL either performed or assessed with clinical judgement   ADL Overall ADL's : Needs assistance/impaired Eating/Feeding: Independent                                   Functional mobility during ADLs: Min guard;Supervision/safety;Cane       Vision       Perception     Praxis      Cognition Arousal/Alertness: Lethargic Behavior During Therapy: WFL for tasks assessed/performed   Area of Impairment: Memory                     Memory: Decreased short-term memory         General Comments: Patient initially very lethargic. Difficulty keeping eyes open while seated in straight back chair. Patient "perked" up toward the end of session. Seemed to be himself compared to date of evaluation.        Exercises     Shoulder Instructions       General Comments Patient met seated in straight back chair. States that he was attempting to return to bed after using the  bathroom but was "unable to make it back" 2/2 pain in LLE. RN notified. Recommend supervision for toilet tranfers with use of SPC for safety.    Pertinent Vitals/ Pain       Pain Assessment: 0-10 Pain Score: 10-Worst pain ever Pain Location: LLE Pain Descriptors / Indicators: Grimacing;Guarding;Shooting Pain Intervention(s): Limited activity within patient's tolerance;Monitored during session;Repositioned;Patient requesting pain meds-RN notified  Home Living                                          Prior Functioning/Environment              Frequency  Min 2X/week        Progress Toward Goals  OT Goals(current goals can now be found in the care plan section)  Progress towards OT goals: Progressing toward goals  Acute Rehab OT Goals Patient Stated  Goal: To return home OT Goal Formulation: With patient Time For Goal Achievement: 03/20/21 Potential to Achieve Goals: Good ADL Goals Additional ADL Goal #1: Patient will complete a.m. ADLs with no more than supervision A, AE PRN, and LRAD. Additional ADL Goal #2: Patient will utilize 3 compensatory strategies 2/2 decreased STM in prep for IADLs. Additional ADL Goal #3: Patient will complete BUE HEP with I and use of written handout.  Plan Discharge plan remains appropriate;Frequency remains appropriate    Co-evaluation                 AM-PAC OT "6 Clicks" Daily Activity     Outcome Measure   Help from another person eating meals?: None Help from another person taking care of personal grooming?: A Little Help from another person toileting, which includes using toliet, bedpan, or urinal?: A Little Help from another person bathing (including washing, rinsing, drying)?: A Little Help from another person to put on and taking off regular upper body clothing?: None Help from another person to put on and taking off regular lower body clothing?: None 6 Click Score: 21    End of Session Equipment Utilized During Treatment: Other (comment) (SPC)  OT Visit Diagnosis: Pain Pain - Right/Left: Left Pain - part of body: Leg;Hip   Activity Tolerance Patient tolerated treatment well   Patient Left in chair;with call bell/phone within reach;with chair alarm set   Nurse Communication Mobility status;Other (comment) (Supervision A for mobility and toileting tasks.)        Time: 4818-5909 OT Time Calculation (min): 16 min  Charges: OT General Charges $OT Visit: 1 Visit OT Treatments $Self Care/Home Management : 8-22 mins  [name] Rawson H. OTR/L Supplemental OT, Department of rehab services [phone]   Caedyn Tassinari R H. 03/12/2021, 8:51 AM

## 2021-03-12 NOTE — Progress Notes (Signed)
Subjective:   Joseph [name] says he continues to have left lower leg pain. He was unable to walk from the bathroom to his bed and had to stop and fell asleep in a chair last night due to pain. He was unable to ask for pain medication. His pain improved after dilaudid PRN this morning. He denies any SOB or CP and states that he has been eating well. Notes troubles "getting the words out of his mouth" and memory troubles that have acutely worsened since his CABG last year. Also notes intermittent buttocks numbness that switches sides. No new numbness or weakness.  Objective:  Vital signs in last 24 hours: Vitals:   03/11/21 1227 03/11/21 1837 03/11/21 2327 03/12/21 0433  BP: 109/87 91/67 101/74 (!) 142/82  Pulse: 80 82 86 81  Resp: 16 16 17 18   Temp: 98 F (36.7 C) 98 F (36.7 C) 98.2 F (36.8 C) 97.8 F (36.6 C)  TempSrc: Oral Oral Oral Oral  SpO2: 100% 94% 99% 100%  Weight:    (!) 173.6 kg  Height:       General: Middle-aged male, no acute distress, chronically ill-appearing Respiratory: CTA BL, no wheezing, rhonchi, rales Cardiac: RRR, 3/6 systolic murmur heard loudest over the left upper chest, there is 1-2+ bilateral lower extremity edema MSK: Left lower extremity is minimally tender to palpation  Skin: Bandage over the left shin wounds, improved erythema. Bilateral lower extremities feel cool.   Assessment/Plan:  Active Problems:   Dyslipidemia   CAD -S/P PCI 11/02/19    OSA (obstructive sleep apnea)   Insulin dependent type 2 diabetes mellitus (HCC)   Anxiety disorder   PAD (peripheral artery disease) (HCC)   Cellulitis   Cellulitis of left leg  Purulent LLE Cellulitis in the setting of Multiple Diabetic Foot/Shin Wounds and Moderate PAD of the LLE s/p revascularization of the L SFA and L popliteal arteries 09/2020 Absence of radial pulse in RUE, with small distal lesions, Concerning for Ischemia Mr. Petrelli notes that he continues to have pain although rarely calls  for PRN pain medication. Vascular studies to date are concerning for stenosis of left SFA, which had previously been stented. Patient was unable to lay flat for aortogram w/ run-off due to chronic pain. Upper extremity findings remain consistent with previous exams.   - Vascular following; appreciate their recommendations  - Aortogram with run-off scheduled for tomorrow; NPO @ midnight - Continue vancomycin, per pharmacy  - Continue to follow blood cultures, no growth to date - Continue ASA and Plavix  - Will schedule Roxicodone 5mg  q6 hours for improved pain control with PRN as needed - Continue wound care  - Monitor CBC's daily    Uncontrolled IDDM Type II  History of diabetes on Lantus 40 units twice daily, humalog, Victoza, and Metformin at home.  CBG's tend to run lower in the evening, down to 80 yesterday, although are otherwise well-controlled. Patient is eating full meals.    - Continue Lantus 46 units in the mornings  - Continue Lantus 52 units nightly  - Continue Resistant SSI  - Will decrease mealtime Novolog to 10 units TID if >50% meal is eaten  - Continue to hold evening SSI - Continue CBG monitoring  - Daily BMP - Continue home pregabalin 150 mg 3 times daily for neuropathic pain.  HFpEF, Stable Chronic Venous Stasis  Patient takes Lasix 40mg  daily at home. His weight is up since admission, although he continues to urinate.   - Continue  home Lasix 40mg  daily for now - Consider additional dose of Lasix tomorrow if he remains volume overloaded  - Strict I&O w/ daily weights  - Continue to hold coreg and lisinopril for now given soft pressures   AKI on CKD Stage II, Resolved  Kidney function remains stable near baseline ~1.3.  - Continue strict I&O - Continue to monitor daily renal function   Hx of Hypotension Pressures tend to run high in the mornings although are soft otherwise.   - Continue Midodrine 5mg  TID  - Continue to hold home Coreg and lisinopril for  now - Close monitoring   Atypical Chest Pain, Improving Hx of CAD s/p CABG in 2021 Patient denies any CP today. Symptoms seem to have stabilized with diuresis.  - Continue ASA/Plavix  - Continue to monitor   B12 Deficiency - Continue weekly B12 injections while admitted   Hypomagnesemia Hyperkalemia, Resolved Likely in setting of Lasix use given transiently low.   - Continue to monitor daily electrolytes - Replace as needed for goal > 2 for Mg, >4 for K+  Prior to Admission Living Arrangement: Home Anticipated Discharge Location: Home (pending further vascular workup / PT evaluation)  , M.D. PGY1 Pager [phone] 03/12/2021 10:16 AM After 5pm on weekdays and 1pm on weekends: On Call pager [phone]

## 2021-03-12 NOTE — Progress Notes (Signed)
Physical Therapy Treatment Patient Details Name: Joseph [name] MRN: [id number] DOB: [birth year] Today's Date: 03/12/2021    History of Present Illness Pt is a 50 y/o male presesnting on 3/16 secondary to increased pain and swelling in LLE and L foot wound. Pt with recent admission secondary to the same. Thought to be secondary to cellulitis; imaging negative for osteomyelitis. PMH includes L 4th toe amputation, DM, CAD s/p CABG, and DVT/PE.    PT Comments    Pt received sitting in recliner, willing to participate in PT today stating "if I have pain sitting or moving, I might as well walk." However, unwilling to try stairs due to not wanting to increase L LE pain more. Pt reported that he had significant leg pain earlier while walking from bathroom and needed to sit in straight back chair although he intended to go back to bed. Refused gait belt twice and guarding for sit to stand, stating he needed room to stand but okay with min guard level for ambulation in hallway. No LOB but gait noted to be antalgic with decrease weight shift to L. Pt benefits for UE support during ambulation for steadying. Left sitting on EOB with call bell within reach. Will continue to follow acutely.      Follow Up Recommendations  No PT follow up     Equipment Recommendations  Cane    Recommendations for Other Services       Precautions / Restrictions Precautions Precautions: Fall Precaution Comments: Reports 2 falls to OT (unable to recall when or why he fell). Restrictions Weight Bearing Restrictions: No    Mobility  Bed Mobility               General bed mobility comments: Pt received sitting in recliner    Transfers Overall transfer level: Needs assistance Equipment used: Straight cane Transfers: Sit to/from Stand Sit to Stand: Min guard         General transfer comment: Min guard for safety. No physical assist required. pt prefers to use cane on L  side  Ambulation/Gait Ambulation/Gait assistance: Min guard Gait Distance (Feet): 120 Feet Assistive device: Straight cane;IV Pole Gait Pattern/deviations: Step-to pattern;Decreased weight shift to left;Ataxic;Step-through pattern Gait velocity: Decreased   General Gait Details: Decreased weight shift to L, pt feels that cane feels better in L hand although educated it may provide more assistance on R side. Pt ended up using IV pole on R side. Min guard for safety, no physical assist   Stairs             Wheelchair Mobility    Modified Rankin (Stroke Patients Only)       Balance Overall balance assessment: Mild deficits observed, not formally tested                                          Cognition Arousal/Alertness: Awake/alert Behavior During Therapy: WFL for tasks assessed/performed;Flat affect Overall Cognitive Status: No family/caregiver present to determine baseline cognitive functioning Area of Impairment: Memory                     Memory: Decreased short-term memory         General Comments: Pt slightly impulsive. Values independence and hesitant to accept assistance      Exercises      General Comments General comments (skin integrity, edema, etc.): Patient met seated  in straight back chair. States that he was attempting to return to bed after using the bathroom but was "unable to make it back" 2/2 pain in LLE. RN notified. Recommend supervision for toilet tranfers with use of SPC for safety.      Pertinent Vitals/Pain Pain Assessment: Faces Pain Score: 10-Worst pain ever Faces Pain Scale: Hurts even more Pain Location: LLE Pain Descriptors / Indicators: Grimacing;Guarding;Shooting Pain Intervention(s): Monitored during session;Repositioned    Home Living                      Prior Function            PT Goals (current goals can now be found in the care plan section) Acute Rehab PT Goals Patient Stated  Goal: To return home    Frequency    Min 3X/week      PT Plan Current plan remains appropriate    Co-evaluation              AM-PAC PT "6 Clicks" Mobility   Outcome Measure  Help needed turning from your back to your side while in a flat bed without using bedrails?: None Help needed moving from lying on your back to sitting on the side of a flat bed without using bedrails?: None Help needed moving to and from a bed to a chair (including a wheelchair)?: A Little Help needed standing up from a chair using your arms (e.g., wheelchair or bedside chair)?: A Little Help needed to walk in hospital room?: A Little Help needed climbing 3-5 steps with a railing? : A Little 6 Click Score: 20    End of Session Equipment Utilized During Treatment:  (pt refused gait belt) Activity Tolerance: Patient limited by pain Patient left: in bed;with call bell/phone within reach Nurse Communication: Mobility status PT Visit Diagnosis: Difficulty in walking, not elsewhere classified (R26.2);Pain Pain - Right/Left: Left Pain - part of body: Leg;Ankle and joints of foot     Time:  -     Charges:                        Rosita Kea, SPT

## 2021-03-13 ENCOUNTER — Encounter (HOSPITAL_COMMUNITY)
Admission: EM | Disposition: A | Discharge status: Home / Self Care | Payer: Self-pay | Source: Home / Self Care | Attending: Internal Medicine

## 2021-03-13 ENCOUNTER — Inpatient Hospital Stay (HOSPITAL_COMMUNITY): Payer: Medicare HMO

## 2021-03-13 ENCOUNTER — Inpatient Hospital Stay (HOSPITAL_COMMUNITY): Payer: Medicare HMO | Admitting: Certified Registered"

## 2021-03-13 DIAGNOSIS — E1159 Type 2 diabetes mellitus with other circulatory complications: Secondary | ICD-10-CM | POA: Diagnosis not present

## 2021-03-13 DIAGNOSIS — L03116 Cellulitis of left lower limb: Secondary | ICD-10-CM | POA: Diagnosis not present

## 2021-03-13 DIAGNOSIS — I70244 Atherosclerosis of native arteries of left leg with ulceration of heel and midfoot: Secondary | ICD-10-CM

## 2021-03-13 DIAGNOSIS — N182 Chronic kidney disease, stage 2 (mild): Secondary | ICD-10-CM | POA: Diagnosis not present

## 2021-03-13 DIAGNOSIS — E11628 Type 2 diabetes mellitus with other skin complications: Secondary | ICD-10-CM | POA: Diagnosis not present

## 2021-03-13 HISTORY — PX: AORTOGRAM: SHX6300

## 2021-03-13 LAB — BASIC METABOLIC PANEL
Anion gap: 7 (ref 5–15)
BUN: 30 mg/dL — ABNORMAL HIGH (ref 6–20)
CO2: 26 mmol/L (ref 22–32)
Calcium: 8.8 mg/dL — ABNORMAL LOW (ref 8.9–10.3)
Chloride: 104 mmol/L (ref 98–111)
Creatinine, Ser: 1.33 mg/dL — ABNORMAL HIGH (ref 0.61–1.24)
GFR, Estimated: >60 mL/min (ref >60)
Glucose, Bld: 146 mg/dL — ABNORMAL HIGH (ref 70–99)
Potassium: 4.6 mmol/L (ref 3.5–5.1)
Sodium: 137 mmol/L (ref 135–145)

## 2021-03-13 LAB — CBC
HCT: 32.9 % — ABNORMAL LOW (ref 39.0–52.0)
Hemoglobin: 10.4 g/dL — ABNORMAL LOW (ref 13.0–17.0)
MCH: 25.6 pg — ABNORMAL LOW (ref 26.0–34.0)
MCHC: 31.6 g/dL (ref 30.0–36.0)
MCV: 80.8 fL (ref 80.0–100.0)
Platelets: 268 10*3/uL (ref 150–400)
RBC: 4.07 MIL/uL — ABNORMAL LOW (ref 4.22–5.81)
RDW: 15.4 % (ref 11.5–15.5)
WBC: 6.6 10*3/uL (ref 4.0–10.5)
nRBC: 0 % (ref 0.0–0.2)

## 2021-03-13 LAB — GLUCOSE, CAPILLARY
Glucose-Capillary: 130 mg/dL — ABNORMAL HIGH (ref 70–99)
Glucose-Capillary: 91 mg/dL (ref 70–99)
Glucose-Capillary: 82 mg/dL (ref 70–99)
Glucose-Capillary: 98 mg/dL (ref 70–99)
Glucose-Capillary: 238 mg/dL — ABNORMAL HIGH (ref 70–99)

## 2021-03-13 SURGERY — AORTOGRAM
Anesthesia: General | Site: Groin | Laterality: Bilateral

## 2021-03-13 MED ORDER — CHLORHEXIDINE GLUCONATE 0.12 % MT SOLN
OROMUCOSAL | Status: AC
  Filled 2021-03-13: qty 15

## 2021-03-13 MED ORDER — FENTANYL CITRATE (PF) 250 MCG/5ML IJ SOLN
INTRAMUSCULAR | Status: AC
  Filled 2021-03-13: qty 5

## 2021-03-13 MED ORDER — PROPOFOL 10 MG/ML IV BOLUS
INTRAVENOUS | Status: AC
  Filled 2021-03-13: qty 20

## 2021-03-13 MED ORDER — ACETAMINOPHEN 10 MG/ML IV SOLN
1000.0000 mg | Freq: Once | INTRAVENOUS | Status: DC | PRN
Start: 2021-03-13 — End: 2021-03-13

## 2021-03-13 MED ORDER — FENTANYL CITRATE (PF) 100 MCG/2ML IJ SOLN
INTRAMUSCULAR | Status: DC | PRN
  Administered 2021-03-13: 100 ug via INTRAVENOUS
  Administered 2021-03-13: 50 ug via INTRAVENOUS

## 2021-03-13 MED ORDER — MIDAZOLAM HCL 5 MG/5ML IJ SOLN
INTRAMUSCULAR | Status: DC | PRN
  Administered 2021-03-13: 2 mg via INTRAVENOUS

## 2021-03-13 MED ORDER — PROPOFOL 10 MG/ML IV BOLUS
INTRAVENOUS | Status: DC | PRN
Start: 2021-03-13 — End: 2021-03-13
  Administered 2021-03-13: 200 mg via INTRAVENOUS

## 2021-03-13 MED ORDER — ACETAMINOPHEN 10 MG/ML IV SOLN
INTRAVENOUS | Status: AC
  Administered 2021-03-13: 1000 mg
  Filled 2021-03-13: qty 100

## 2021-03-13 MED ORDER — LACTATED RINGERS IV SOLN: INTRAVENOUS | Status: DC | PRN

## 2021-03-13 MED ORDER — PHENYLEPHRINE HCL-NACL 10-0.9 MG/250ML-% IV SOLN
INTRAVENOUS | Status: DC | PRN
  Administered 2021-03-13: 30 ug/min via INTRAVENOUS

## 2021-03-13 MED ORDER — PROMETHAZINE HCL 25 MG/ML IJ SOLN: 6.2500 mg | INTRAMUSCULAR | Status: DC | PRN

## 2021-03-13 MED ORDER — HYDROMORPHONE HCL 1 MG/ML IJ SOLN: 0.2500 mg | INTRAMUSCULAR | Status: DC | PRN

## 2021-03-13 MED ORDER — MEPERIDINE HCL 25 MG/ML IJ SOLN: 6.2500 mg | INTRAMUSCULAR | Status: DC | PRN

## 2021-03-13 MED ORDER — HYDROMORPHONE HCL 1 MG/ML IJ SOLN
INTRAMUSCULAR | Status: AC
  Administered 2021-03-13: 0.5 mg via INTRAVENOUS
  Filled 2021-03-13: qty 1

## 2021-03-13 MED ORDER — ONDANSETRON HCL 4 MG/2ML IJ SOLN
INTRAMUSCULAR | Status: DC | PRN
  Administered 2021-03-13: 4 mg via INTRAVENOUS

## 2021-03-13 MED ORDER — SODIUM CHLORIDE 0.9 % IV SOLN
INTRAVENOUS | Status: DC | PRN
  Administered 2021-03-13: 500 mL

## 2021-03-13 MED ORDER — PHENYLEPHRINE 40 MCG/ML (10ML) SYRINGE FOR IV PUSH (FOR BLOOD PRESSURE SUPPORT)
PREFILLED_SYRINGE | INTRAVENOUS | Status: DC | PRN
  Administered 2021-03-13 (×2): 120 ug via INTRAVENOUS

## 2021-03-13 MED ORDER — CHLORHEXIDINE GLUCONATE 0.12 % MT SOLN
15.0000 mL | Freq: Once | OROMUCOSAL | Status: AC
  Administered 2021-03-13: 15 mL via OROMUCOSAL
  Filled 2021-03-13: qty 15

## 2021-03-13 MED ORDER — INSULIN ASPART 100 UNIT/ML ~~LOC~~ SOLN
12.0000 [IU] | Freq: Three times a day (TID) | SUBCUTANEOUS | Status: DC
  Administered 2021-03-13 – 2021-03-14 (×4): 12 [IU] via SUBCUTANEOUS

## 2021-03-13 MED ORDER — MIDAZOLAM HCL 2 MG/2ML IJ SOLN
INTRAMUSCULAR | Status: AC
  Filled 2021-03-13: qty 2

## 2021-03-13 MED ORDER — LIDOCAINE 2% (20 MG/ML) 5 ML SYRINGE
INTRAMUSCULAR | Status: DC | PRN
  Administered 2021-03-13: 100 mg via INTRAVENOUS

## 2021-03-13 MED ORDER — IODIXANOL 320 MG/ML IV SOLN
INTRAVENOUS | Status: DC | PRN
  Administered 2021-03-13: 48 mL via INTRA_ARTERIAL

## 2021-03-13 MED ORDER — SODIUM CHLORIDE 0.9 % IV SOLN
INTRAVENOUS | Status: AC
  Filled 2021-03-13: qty 1.2

## 2021-03-13 MED ORDER — 0.9 % SODIUM CHLORIDE (POUR BTL) OPTIME
TOPICAL | Status: DC | PRN
  Administered 2021-03-13: 1000 mL

## 2021-03-13 SURGICAL SUPPLY — 49 items
ADH SKN CLS APL DERMABOND .7 (GAUZE/BANDAGES/DRESSINGS) ×1
APL PRP STRL LF DISP 70% ISPRP (MISCELLANEOUS) ×1
BAG SNAP BAND KOVER 36X36 (MISCELLANEOUS) ×2 IMPLANT
BLADE SURG 11 STRL SS (BLADE) ×1 IMPLANT
CANISTER SUCT 3000ML PPV (MISCELLANEOUS) ×2 IMPLANT
CATH OMNI FLUSH 5F 65CM (CATHETERS) ×2 IMPLANT
CHLORAPREP W/TINT 26 (MISCELLANEOUS) ×2 IMPLANT
COVER BACK TABLE 80X110 HD (DRAPES) ×4 IMPLANT
COVER DOME SNAP 22 D (MISCELLANEOUS) ×2 IMPLANT
COVER PROBE W GEL 5X96 (DRAPES) ×2 IMPLANT
COVER SURGICAL LIGHT HANDLE (MISCELLANEOUS) ×2 IMPLANT
COVER WAND RF STERILE (DRAPES) ×2 IMPLANT
DERMABOND ADVANCED (GAUZE/BANDAGES/DRESSINGS) ×1
DERMABOND ADVANCED .7 DNX12 (GAUZE/BANDAGES/DRESSINGS) ×1 IMPLANT
DEVICE CLOSURE MYNXGRIP 5F (Vascular Products) ×1 IMPLANT
DEVICE TORQUE KENDALL .025-038 (MISCELLANEOUS) IMPLANT
DRAPE FEMORAL ANGIO 80X135IN (DRAPES) ×1 IMPLANT
DRSG OPSITE POSTOP 4X8 (GAUZE/BANDAGES/DRESSINGS) ×1 IMPLANT
ELECT REM PT RETURN 9FT ADLT (ELECTROSURGICAL)
ELECTRODE REM PT RTRN 9FT ADLT (ELECTROSURGICAL) IMPLANT
FILTER CO2 0.2 MICRON (VASCULAR PRODUCTS) IMPLANT
FILTER CO2 INSUFFLATOR AX1008 (MISCELLANEOUS) IMPLANT
GAUZE 4X4 16PLY RFD (DISPOSABLE) ×2 IMPLANT
GLOVE BIO SURGEON STRL SZ7.5 (GLOVE) ×2 IMPLANT
GOWN STRL REUS W/ TWL LRG LVL3 (GOWN DISPOSABLE) ×2 IMPLANT
GOWN STRL REUS W/ TWL XL LVL3 (GOWN DISPOSABLE) ×1 IMPLANT
GOWN STRL REUS W/TWL LRG LVL3 (GOWN DISPOSABLE) ×4
GOWN STRL REUS W/TWL XL LVL3 (GOWN DISPOSABLE) ×2
GUIDEWIRE ANGLED .035X150CM (WIRE) IMPLANT
KIT BASIN OR (CUSTOM PROCEDURE TRAY) ×2 IMPLANT
KIT TURNOVER KIT B (KITS) ×2 IMPLANT
NDL PERC 18GX7CM (NEEDLE) ×1 IMPLANT
NEEDLE PERC 18GX7CM (NEEDLE) IMPLANT
NS IRRIG 1000ML POUR BTL (IV SOLUTION) IMPLANT
PAD ARMBOARD 7.5X6 YLW CONV (MISCELLANEOUS) ×4 IMPLANT
SET FLUSH CO2 (MISCELLANEOUS) IMPLANT
SET MICROPUNCTURE 5F STIFF (MISCELLANEOUS) ×1 IMPLANT
SHEATH PINNACLE 5F 10CM (SHEATH) ×1 IMPLANT
SPONGE GAUZE 2X2 8PLY STRL LF (GAUZE/BANDAGES/DRESSINGS) ×1 IMPLANT
STOPCOCK MORSE 400PSI 3WAY (MISCELLANEOUS) ×2 IMPLANT
SYR 10ML LL (SYRINGE) ×8 IMPLANT
SYR 20ML LL LF (SYRINGE) ×4 IMPLANT
SYR 30ML LL (SYRINGE) ×2 IMPLANT
SYR MEDRAD MARK V 150ML (SYRINGE) IMPLANT
TOWEL GREEN STERILE (TOWEL DISPOSABLE) ×4 IMPLANT
TUBING HIGH PRESSURE 120CM (CONNECTOR) IMPLANT
UNDERPAD 30X36 HEAVY ABSORB (UNDERPADS AND DIAPERS) IMPLANT
WATER STERILE IRR 1000ML POUR (IV SOLUTION) IMPLANT
WIRE BENTSON .035X145CM (WIRE) ×2 IMPLANT

## 2021-03-13 NOTE — Anesthesia Preprocedure Evaluation (Addendum)
Anesthesia Evaluation  Patient identified by MRN, date of birth, ID band Patient awake    Reviewed: Allergy & Precautions, NPO status , Patient's Chart, lab work & pertinent test results  Airway Mallampati: II  TM Distance: >3 FB     Dental  (+) Poor Dentition, Missing,    Pulmonary asthma , sleep apnea , pneumonia, COPD,  COPD inhaler, Current Smoker, former smoker,    breath sounds clear to auscultation       Cardiovascular hypertension, Pt. on medications and Pt. on home beta blockers + angina + CAD, + Past MI, + Cardiac Stents, + CABG and + Peripheral Vascular Disease  Normal cardiovascular exam Rhythm:Regular Rate:Normal     Neuro/Psych PSYCHIATRIC DISORDERS Anxiety Depression  Neuromuscular disease    GI/Hepatic Neg liver ROS, GERD  Medicated,  Endo/Other  diabetes, Type 2, Oral Hypoglycemic AgentsMorbid obesity  Renal/GU Renal InsufficiencyRenal disease     Musculoskeletal  (+) Arthritis ,   Abdominal (+) + obese,   Peds  Hematology  (+) Blood dyscrasia, anemia ,   Anesthesia Other Findings 2. Left ventricular ejection fraction, by estimation, is approximately 50 to 55%. The left ventricle has low normal function. Left ventricular endocardial border not optimally defined to evaluate regional wall motion. There is mild left ventricular hypertrophy. Left ventricular diastolic parameters were normal. Definity contrast study could be considered. 3. Right ventricular systolic function is low normal. The right ventricular size is normal. Tricuspid regurgitation signal is inadequate for assessing PA pressure. 4. The mitral valve is grossly normal. No evidence of mitral valve regurgitation. 5. The aortic valve was not well visualized, mild annular calcification. Aortic valve regurgitation is not visualized. Comparison(s): TEE done 07/24/20 showed an EF of 50-55%. Left Ventricle: Left ventricular ejection   Reproductive/Obstetrics                            Anesthesia Physical  Anesthesia Plan  ASA: III  Anesthesia Plan: General   Post-op Pain Management:    Induction: Intravenous  PONV Risk Score and Plan: 3 and Ondansetron, Treatment may vary due to age or medical condition and Midazolam  Airway Management Planned: LMA  Additional Equipment:   Intra-op Plan:   Post-operative Plan: Extubation in OR  Informed Consent: I have reviewed the patients History and Physical, chart, labs and discussed the procedure including the risks, benefits and alternatives for the proposed anesthesia with the patient or authorized representative who has indicated his/her understanding and acceptance.     Dental advisory given  Plan Discussed with: CRNA  Anesthesia Plan Comments:        Anesthesia Quick Evaluation

## 2021-03-13 NOTE — Anesthesia Postprocedure Evaluation (Signed)
Anesthesia Post Note  Patient: Joseph [name]  Procedure(s) Performed: ABDOMINAL AORTOGRAM WITH Left LOWER EXTREMITY RUNOFF (Bilateral Groin)     Patient location during evaluation: PACU Anesthesia Type: General Level of consciousness: sedated Pain management: pain level controlled Vital Signs Assessment: post-procedure vital signs reviewed and stable Respiratory status: spontaneous breathing Cardiovascular status: stable Postop Assessment: no apparent nausea or vomiting Anesthetic complications: no   No complications documented.  Last Vitals:  Vitals:   03/13/21 1340 03/13/21 1400  BP: 111/72   Pulse: 94   Resp: 11   Temp:  (!) 36.1 C  SpO2: 95%     Last Pain:  Vitals:   03/13/21 1400  TempSrc:   PainSc: Asleep                 Caren Macadam

## 2021-03-13 NOTE — Progress Notes (Signed)
[facility] [place] PROGRESS NOTE  ASSESSMENT / PLAN: Joseph [name] is a 50 y.o. male with left leg cellulitis and a malperforans ulcer of the left plantar foot. History of left SFA / popliteal stenting. Duplex suggests stenosis of stenting. Plan left lower extremity angiography with possible intervention. Needs LMA because he cannot lay flat.   SUBJECTIVE: No complaints. Discussed plan for today.  OBJECTIVE: BP 105/86 (BP Location: Right Arm)   Pulse 82   Temp (!) 97.4 F (36.3 C) (Oral)   Resp 18   Ht 6\' 5"  (1.956 m)   Wt (!) 173.7 kg   SpO2 99%   BMI 45.42 kg/m   Intake/Output Summary (Last 24 hours) at 03/13/2021 1129 Last data filed at 03/12/2021 1700 Gross per 24 hour  Intake 240 ml  Output -  Net 240 ml    Morbidly obese No pedal pulses L calf cellulitis L pedal malperforans ulcer  CBC Latest Ref Rng & Units 03/13/2021 03/12/2021 03/11/2021  WBC 4.0 - 10.5 K/uL 6.6 7.9 6.3  Hemoglobin 13.0 - 17.0 g/dL 10.4(L) 10.9(L) 10.0(L)  Hematocrit 39.0 - 52.0 % 32.9(L) 34.5(L) 32.9(L)  Platelets 150 - 400 K/uL 268 332 284     CMP Latest Ref Rng & Units 03/13/2021 03/12/2021 03/11/2021  Glucose 70 - 99 mg/dL 03/13/2021) 638(V) 564(P)  BUN 6 - 20 mg/dL 329(J) 18(A) 41(Y)  Creatinine 0.61 - 1.24 mg/dL 60(Y) 3.01(S) 0.10(X)  Sodium 135 - 145 mmol/L 137 138 136  Potassium 3.5 - 5.1 mmol/L 4.6 4.8 4.8  Chloride 98 - 111 mmol/L 104 104 105  CO2 22 - 32 mmol/L 26 28 26   Calcium 8.9 - 10.3 mg/dL 3.23(F) 9.2 9.0  Total Protein 6.5 - 8.1 g/dL - - -  Total Bilirubin 0.3 - 1.2 mg/dL - - -  Alkaline Phos 38 - 126 U/L - - -  AST 15 - 41 U/L - - -  ALT 0 - 44 U/L - - -    Estimated Creatinine Clearance: 119.4 mL/min (A) (by C-G formula based on SCr of 1.33 mg/dL (H)).  . 5.7(D, MD [facility] Phone Number: [phone] 03/13/2021 11:29 AM

## 2021-03-13 NOTE — Transfer of Care (Signed)
Immediate Anesthesia Transfer of Care Note  Patient: Joseph [name]  Procedure(s) Performed: ABDOMINAL AORTOGRAM WITH Left LOWER EXTREMITY RUNOFF (Bilateral Groin)  Patient Location: PACU  Anesthesia Type:General  Level of Consciousness: awake, alert  and oriented  Airway & Oxygen Therapy: Patient Spontanous Breathing and Patient connected to nasal cannula oxygen  Post-op Assessment: Report given to RN and Post -op Vital signs reviewed and stable  Post vital signs: Reviewed and stable  Last Vitals:  Vitals Value Taken Time  BP    Temp    Pulse 88 03/13/21 1242  Resp 10 03/13/21 1242  SpO2 95 % 03/13/21 1242  Vitals shown include unvalidated device data.  Last Pain:  Vitals:   03/13/21 0809  TempSrc:   PainSc: Asleep      Patients Stated Pain Goal: 3 (03/13/21 0602)  Complications: No complications documented.

## 2021-03-13 NOTE — Progress Notes (Signed)
Subjective:   Joseph [name] was on his way down for his procedure when we evaluated him. He is very eager to have his procedure done. Denies any pain currently or any other symptoms.   Objective:  Vital signs in last 24 hours: Vitals:   03/12/21 1648 03/12/21 2011 03/13/21 0537 03/13/21 0550  BP: 116/85 113/78 (P) 105/86 105/86  Pulse: 67 79 (P) 82 82  Resp: 18 18 (P) 18 18  Temp: 97.6 F (36.4 C) (!) 97.5 F (36.4 C) (!) (P) 97.4 F (36.3 C) (!) 97.4 F (36.3 C)  TempSrc: Oral Oral (P) Oral Oral  SpO2: 98% 98% (P) 99% 99%  Weight:    (!) 173.7 kg  Height:       General: Middle-aged male, no acute distress, chronically ill-appearing Respiratory: Breathing comfortably on room air Cardiovascular: There is trace pitting edema of the LLE, which feels warm. MSK: There is some swelling without significant tenderness of the LLE. Skin: L shin wound is healing well, although the surrounding redness of his lower legs and induration have remained unchanged. LLE is warmer to touch.  Assessment/Plan:  Active Problems:   Dyslipidemia   CAD -S/P PCI 11/02/19    OSA (obstructive sleep apnea)   Insulin dependent type 2 diabetes mellitus (HCC)   Anxiety disorder   PAD (peripheral artery disease) (HCC)   Cellulitis   Cellulitis of left leg  Purulent LLE Cellulitis in the setting of Multiple Diabetic Foot/Shin Wounds and Moderate PAD of the LLE s/p revascularization of the L SFA and L popliteal arteries 09/2020 Absence of radial pulse in RUE, with small distal lesions, Concerning for Ischemia Joseph [name] notes that he does still have some pain in his LLE despite scheduled Roxicodone yesterday. He is currently going down for aortogram with run-off to assess necessary interventions due to concerns for LLE SFA re-stenosis s/p stenting.   - Vascular following; appreciate their recommendations  - Aortogram with run-off +/- intervention today - Continue vancomycin, per pharmacy  - Continue to  follow blood cultures, no growth to date - Hold ASA and Plavix  - Will reassess after procedure and provide needed pain medications - Continue wound care  - Monitor CBC's daily    Uncontrolled IDDM Type II  History of diabetes on Lantus 40 units twice daily, humalog, Victoza, and Metformin at home.  His sugars became elevated after mealtime Novolog was decreased from 12 to 10 units yesterday. Sugars prior to meals remain stable.   - Continue Lantus 46 units in the mornings  - Continue Lantus 52 units nightly  - Continue Resistant SSI  - Increased mealtime Novolog back to 12 units TID WC if >50% meal is eaten  - Continue to hold evening SSI - Continue CBG monitoring  - Daily BMP - Continue home pregabalin 150 mg 3 times daily for neuropathic pain.  HFpEF, Stable Chronic Venous Stasis  His weight is up since admission, although has remained stable the last 3 days on home Lasix 40mg  PO.   - Continue home Lasix 40mg  daily for now - Difficult to assess volume status in setting of body habitus  - Will discuss dry weight and adjust Lasix as needed  - Strict I&O w/ daily weights  - Continue to hold coreg and lisinopril for now given soft pressures   AKI on CKD Stage II, Resolved  Kidney function remains stable near baseline ~1.3 on home Lasix.  - Continue strict I&O - Continue to monitor daily renal function  Hx of Hypotension Pressures tend to run high in the mornings although are soft otherwise.   - Continue Midodrine 5mg  TID  - Continue to hold home Coreg and lisinopril for now - If pressures continue to remain high in the morning tomorrow, will discontinue morning midodrine.   Atypical Chest Pain, Improving Hx of CAD s/p CABG in 2021  - Continue ASA/Plavix  - Continue to monitor   B12 Deficiency - Continue weekly B12 injections while admitted   Hypomagnesemia, Resolved Hyperkalemia, Resolved Likely in setting of Lasix use given transiently low.   - Continue to  monitor daily electrolytes - Replace as needed for goal > 2 for Mg, >4 for K+  Prior to Admission Living Arrangement: Home Anticipated Discharge Location: Home (pending further vascular workup)  2022, M.D. PGY1 Pager [phone] 03/13/2021 7:27 AM After 5pm on weekdays and 1pm on weekends: On Call pager [phone]

## 2021-03-13 NOTE — Anesthesia Procedure Notes (Signed)
Procedure Name: LMA Insertion Date/Time: 03/13/2021 11:35 AM Performed by: Sheppard Evens, CRNA Pre-anesthesia Checklist: Patient identified, Emergency Drugs available, Suction available and Patient being monitored Patient Re-evaluated:Patient Re-evaluated prior to induction Oxygen Delivery Method: Circle System Utilized Preoxygenation: Pre-oxygenation with 100% oxygen Induction Type: IV induction Ventilation: Mask ventilation without difficulty LMA: LMA inserted LMA Size: 5.0 Number of attempts: 1 Airway Equipment and Method: Bite block Placement Confirmation: positive ETCO2 Tube secured with: Tape Dental Injury: Teeth and Oropharynx as per pre-operative assessment

## 2021-03-13 NOTE — Op Note (Signed)
DATE OF SERVICE: 03/13/2021  PATIENT:  Joseph [name]  50 y.o. male  PRE-OPERATIVE DIAGNOSIS:  Atherosclerosis of native arteries of left lower extremity causing malperforans ulcer  POST-OPERATIVE DIAGNOSIS:  Same  PROCEDURE:   1) US guided right common femoral access 2) Aortogram 3) Left lower extremity angiogram with second order cannulation   SURGEON:  Surgeon(s) and Role:    * Leonie Douglas, MD - Primary  ASSISTANT: none  ANESTHESIA:   general  EBL: min  BLOOD ADMINISTERED:none  DRAINS: none   LOCAL MEDICATIONS USED:  NONE  SPECIMEN:  none  COUNTS: confirmed correct.  TOURNIQUET:  None  PATIENT DISPOSITION:  PACU - hemodynamically stable.   Delay start of Pharmacological VTE agent (>24hrs) due to surgical blood loss or risk of bleeding: no  INDICATION FOR PROCEDURE: Joseph [name] is a 50 y.o. male with cellulitis and a malperforans ulcer of the left lower extremity. After careful discussion of risks, benefits, and alternatives the patient was offered angiography. We specifically discussed access site complications. The patient understood and wished to proceed.  OPERATIVE FINDINGS:  Patient unwilling to do angiogram in cath lab without general anesthesia Unremarkable aortagram Unremarkable left lower extremity angiogram - no evidence of in-stent restenosis. Mild intervening segment stenosis of SFA which is not flow limiting PT/peroneal runoff to the foot The foot is well perfused by the PT  DESCRIPTION OF PROCEDURE: After identification of the patient in the pre-operative holding area, the patient was transferred to the operating room. The patient was positioned supine on the operating room table. Anesthesia was induced. The groins was prepped and draped in standard fashion. A surgical pause was performed confirming correct patient, procedure, and operative location.  The right groin was anesthetized with subcutaneous injection of 1% lidocaine. Using  ultrasound guidance, the right common femoral artery was accessed with micropuncture technique. Fluoroscopy was used to confirm cannulation over the femoral head. Sheathogram was not performed. The 61F sheath was upsized to 24F.   An 035 glidewire advantage was advanced into the distal aorta. Over the wire an omni flush catheter was advanced to the level of L2. Aortogram was performed - see above for details.   The left common iliac artery was selected with the 035 glidewire advantage. The wire was advanced into the common femoral artery. Over the wire the omni flush catheter was advanced into the external iliac artery. Selective angiography was performed - see above for details.   A mynx device was used to close the arteriotomy. Hemostasis was excellent upon completion.  Upon completion of the case instrument and sharps counts were confirmed correct. The patient was transferred to the PACU in good condition. I was present for all portions of the procedure.  PLAN: no further intervention from vascular standpoint. Continue medical therapy for PAD.  Rande Brunt. Lenell Antu, MD [facility] Phone Number: [phone] 03/13/2021 1:06 PM

## 2021-03-13 NOTE — Care Management Important Message (Signed)
Important Message  Patient Details  Name: Joseph [name] MRN: [id number] Date of Birth: [birth year]   Medicare Important Message Given:  Yes - Important Message mailed due to current National Emergency   Verbal consent obtained due to current National Emergency  Relationship to patient: Self Contact Name: Shane Call Date: 03/13/21  Time: 1035 Phone: [phone] Outcome: No Answer/Busy Important Message mailed to: Patient address on file    Orson Aloe 03/13/2021, 10:35 AM

## 2021-03-14 DIAGNOSIS — E119 Type 2 diabetes mellitus without complications: Secondary | ICD-10-CM

## 2021-03-14 DIAGNOSIS — F419 Anxiety disorder, unspecified: Secondary | ICD-10-CM | POA: Diagnosis not present

## 2021-03-14 DIAGNOSIS — E785 Hyperlipidemia, unspecified: Secondary | ICD-10-CM

## 2021-03-14 DIAGNOSIS — I739 Peripheral vascular disease, unspecified: Secondary | ICD-10-CM

## 2021-03-14 DIAGNOSIS — I2510 Atherosclerotic heart disease of native coronary artery without angina pectoris: Secondary | ICD-10-CM | POA: Diagnosis not present

## 2021-03-14 DIAGNOSIS — L03116 Cellulitis of left lower limb: Secondary | ICD-10-CM

## 2021-03-14 DIAGNOSIS — E11628 Type 2 diabetes mellitus with other skin complications: Secondary | ICD-10-CM | POA: Diagnosis not present

## 2021-03-14 DIAGNOSIS — Z794 Long term (current) use of insulin: Secondary | ICD-10-CM | POA: Diagnosis not present

## 2021-03-14 DIAGNOSIS — Z9889 Other specified postprocedural states: Secondary | ICD-10-CM

## 2021-03-14 LAB — BASIC METABOLIC PANEL
Anion gap: 9 (ref 5–15)
BUN: 27 mg/dL — ABNORMAL HIGH (ref 6–20)
CO2: 29 mmol/L (ref 22–32)
Calcium: 9.1 mg/dL (ref 8.9–10.3)
Chloride: 103 mmol/L (ref 98–111)
Creatinine, Ser: 1.48 mg/dL — ABNORMAL HIGH (ref 0.61–1.24)
GFR, Estimated: 58 mL/min — ABNORMAL LOW (ref >60)
Glucose, Bld: 181 mg/dL — ABNORMAL HIGH (ref 70–99)
Potassium: 4.6 mmol/L (ref 3.5–5.1)
Sodium: 141 mmol/L (ref 135–145)

## 2021-03-14 LAB — CBC
HCT: 37 % — ABNORMAL LOW (ref 39.0–52.0)
Hemoglobin: 11.1 g/dL — ABNORMAL LOW (ref 13.0–17.0)
MCH: 25.4 pg — ABNORMAL LOW (ref 26.0–34.0)
MCHC: 30 g/dL (ref 30.0–36.0)
MCV: 84.7 fL (ref 80.0–100.0)
Platelets: 293 10*3/uL (ref 150–400)
RBC: 4.37 MIL/uL (ref 4.22–5.81)
RDW: 15.4 % (ref 11.5–15.5)
WBC: 9.1 10*3/uL (ref 4.0–10.5)
nRBC: 0 % (ref 0.0–0.2)

## 2021-03-14 LAB — GLUCOSE, CAPILLARY
Glucose-Capillary: 158 mg/dL — ABNORMAL HIGH (ref 70–99)
Glucose-Capillary: 160 mg/dL — ABNORMAL HIGH (ref 70–99)
Glucose-Capillary: 98 mg/dL (ref 70–99)

## 2021-03-14 LAB — MAGNESIUM: Magnesium: 1.7 mg/dL (ref 1.7–2.4)

## 2021-03-14 MED ORDER — MAGNESIUM SULFATE 2 GM/50ML IV SOLN
2.0000 g | Freq: Once | INTRAVENOUS | Status: AC
  Administered 2021-03-14: 2 g via INTRAVENOUS
  Filled 2021-03-14: qty 50

## 2021-03-14 MED ORDER — OXYCODONE HCL 15 MG PO TABS: 15.0000 mg | ORAL_TABLET | Freq: Every day | ORAL | 0 refills | Status: DC

## 2021-03-14 MED ORDER — INSULIN LISPRO (1 UNIT DIAL) 100 UNIT/ML (KWIKPEN): 12.0000 [IU] | PEN_INJECTOR | Freq: Three times a day (TID) | SUBCUTANEOUS | 0 refills | Status: DC

## 2021-03-14 MED ORDER — MUPIROCIN CALCIUM 2 % EX CREA: TOPICAL_CREAM | Freq: Every day | CUTANEOUS | 0 refills | Status: DC

## 2021-03-14 MED ORDER — SULFAMETHOXAZOLE-TRIMETHOPRIM 800-160 MG PO TABS: 2.0000 | ORAL_TABLET | Freq: Two times a day (BID) | ORAL | 0 refills | Status: DC

## 2021-03-14 MED ORDER — ASPIRIN EC 81 MG PO TBEC: 81.0000 mg | DELAYED_RELEASE_TABLET | Freq: Every day | ORAL | Status: DC

## 2021-03-14 MED ORDER — CIPROFLOXACIN HCL 500 MG PO TABS: 500.0000 mg | ORAL_TABLET | Freq: Two times a day (BID) | ORAL | 0 refills | Status: AC

## 2021-03-14 MED ORDER — LANTUS SOLOSTAR 100 UNIT/ML ~~LOC~~ SOPN: PEN_INJECTOR | SUBCUTANEOUS | 0 refills | Status: DC

## 2021-03-14 MED ORDER — OXYCODONE HCL 5 MG PO TABS: 5.0000 mg | ORAL_TABLET | Freq: Four times a day (QID) | ORAL | 0 refills | Status: AC | PRN

## 2021-03-14 NOTE — Progress Notes (Addendum)
Subjective:   Joseph [name] states that he overall feels okay today. He endorses new pain running down his left thigh, although denies any changes to the pain of his left lower leg. He notes he did have some bleeding at his catheter insertion site overnight, although this resolved spontaneously and he hasn't had pain or redness there. He denies any fevers, chills, nausea, vomiting or any other symptoms. He would like to order a second breakfast.   Objective:  Vital signs in last 24 hours: Vitals:   03/13/21 1429 03/13/21 1809 03/14/21 0002 03/14/21 0457  BP: 126/81 91/70 118/89 107/85  Pulse: 94 91 81 79  Resp: 16 16 17 17   Temp: 97.6 F (36.4 C) 98.5 F (36.9 C) (!) 97.5 F (36.4 C) 97.6 F (36.4 C)  TempSrc: Oral Oral Oral Oral  SpO2: 97% 94% 94% 96%  Weight:    (!) 174 kg  Height:       General: Middle-aged male, no acute distress, chronically ill-appearing Respiratory: Breathing comfortably on room air Cardiovascular: There is bilateral 1-2+ pitting edema. Unable to palpate distal LE pulses MSK: There is some swelling with moderate TTP of the lower left leg but no TTP of the left upper leg. Skin: L shin wound is scabbed and healing without active drainage, although the surrounding redness of his lower legs and induration have remained unchanged. LLE warmth has decreased since admission. There is scabbing overlying LLE plantar foot ulcer and laceration under 4th toe amputation.  Assessment/Plan:  Active Problems:   Dyslipidemia   CAD -S/P PCI 11/02/19    OSA (obstructive sleep apnea)   Insulin dependent type 2 diabetes mellitus (HCC)   Anxiety disorder   PAD (peripheral artery disease) (HCC)   Cellulitis   Cellulitis of left leg  Purulent LLE Cellulitis in the setting of Multiple Diabetic Foot/Shin Wounds and Moderate PAD of the LLE s/p revascularization of the L SFA and L popliteal arteries 09/2020 Absence of radial pulse in RUE, with small distal lesions in bilateral  hands, Concerning for Ischemia Joseph [name]'s new LLE thing pain is likely from angiography yesterday. Lower leg redness and swelling remain persistent and unchanged after IV vancomycin and cefepime were switched to vancomycin alone. Vascular surgery performed LLE angiography yesterday which did not show any in-stent restenosis. He did have mild SFA stenosis, although this was not flow-limiting. No intervention was performed with recommendation to continue medication management of PAD. Upper extremities S&S remain stable. No active bleeding from catheter insertion site is present. He remains eager to go home to see his son.   - Vascular on board; appreciate their recommendations  - Consulted ID; anticipate he will need broadening of ABX to cover gram negatives and pseudomonas with possible PICC placement  - For now, continue vancomycin per pharmacy  - Continue to follow blood cultures, no growth to date - Resume ASA and Plavix  - Continue wound care  - Consider starting compressive dressings/stocking, pending ID evaluation  - Monitor CBC's daily    Uncontrolled IDDM Type II  History of diabetes on Lantus 40 units twice daily, humalog, Victoza, and Metformin at home.  His sugars did go slightly low although patient was asymptomatic after he was made NPO for his procedure. His sugar was elevated overnight following his procedure, although likely reactive. Sugars improved this morning although patient requested second breakfast.  - Continue Lantus 46 units in the mornings  - Continue Lantus 52 units nightly  - Continue Resistant SSI  -  Continue Novolog back to 12 units TID WC if >50% meal is eaten  - Continue to hold evening SSI - Continue CBG monitoring  - Daily BMP - Continue home pregabalin 150 mg 3 times daily for neuropathic pain.  HFpEF, Stable Chronic Venous Stasis  His weight is up since admission, although LE pitting edema has remained stable and he does not have other signs of volume  overload on exam. He did receive almost an additional L of fluids yesterday for procedure and had previously not been on strict fluid restriction. Outputs not documented in chart.   - Continue home Lasix 40mg  daily for now - Difficult to assess volume status in setting of body habitus  - Consider addition of BID Lasix dosing pending output monitoring and possible LE compression placement - Strict I&O w/ daily weights  - Continue to hold coreg and lisinopril for now given soft pressures   AKI on CKD Stage II, Resolved  Baseline sCr ~ 1.3. Slight increase today likely due to contrast nephropathy.   - Continue strict I&O - Continue to monitor daily renal function   Hx of Hypotension Pressures continue to run soft without room for addition of home antihypertensives.   - Continue Midodrine 5mg  TID  - Continue to hold home Coreg and lisinopril for now  Atypical Chest Pain, Improving Hx of CAD s/p CABG in 2021  - Resume ASA/Plavix  - Continue to monitor   B12 Deficiency - Continue weekly B12 injections while admitted   Hypomagnesemia, Resolved Hyperkalemia, Resolved Likely in setting of Lasix use given transiently low. However Mg slightly low at 1.7.  - Will give Mg Sulfate IVPB 2g  - Continue to monitor daily electrolytes - Replace as needed for goal > 2 for Mg, >4 for K+  Prior to Admission Living Arrangement: Home Anticipated Discharge Location: Home (pending ID evaluation)   , M.D. PGY1 Pager [phone] 03/14/2021 6:47 AM After 5pm on weekdays and 1pm on weekends: On Call pager [phone]

## 2021-03-14 NOTE — Discharge Instructions (Signed)
Cellulitis, Adult  Cellulitis is a skin infection. The infected area is often warm, red, swollen, and sore. It occurs most often in the arms and lower legs. It is very important to get treated for this condition. What are the causes? This condition is caused by bacteria. The bacteria enter through a break in the skin, such as a cut, burn, insect bite, open sore, or crack. What increases the risk? This condition is more likely to occur in people who:  Have a weak body defense system (immune system).  Have open cuts, burns, bites, or scrapes on the skin.  Are older than 48 years of age.  Have a blood sugar problem (diabetes).  Have a long-lasting (chronic) liver disease (cirrhosis) or kidney disease.  Are very overweight (obese).  Have a skin problem, such as: ? Itchy rash (eczema). ? Slow movement of blood in the veins (venous stasis). ? Fluid buildup below the skin (edema).  Have been treated with high-energy rays (radiation).  Use IV drugs. What are the signs or symptoms? Symptoms of this condition include:  Skin that is: ? Red. ? Streaking. ? Spotting. ? Swollen. ? Sore or painful when you touch it. ? Warm.  A fever.  Chills.  Blisters. How is this diagnosed? This condition is diagnosed based on:  Medical history.  Physical exam.  Blood tests.  Imaging tests. How is this treated? Treatment for this condition may include:  Medicines to treat infections or allergies.  Home care, such as: ? Rest. ? Placing cold or warm cloths (compresses) on the skin.  Hospital care, if the condition is very bad. Follow these instructions at home: Medicines  Take over-the-counter and prescription medicines only as told by your doctor.  If you were prescribed an antibiotic medicine, take it as told by your doctor. Do not stop taking it even if you start to feel better. General instructions  Drink enough fluid to keep your pee (urine) pale yellow.  Do not  touch or rub the infected area.  Raise (elevate) the infected area above the level of your heart while you are sitting or lying down.  Place cold or warm cloths on the area as told by your doctor.  Keep all follow-up visits as told by your doctor. This is important.   Contact a doctor if:  You have a fever.  You do not start to get better after 1-2 days of treatment.  Your bone or joint under the infected area starts to hurt after the skin has healed.  Your infection comes back. This can happen in the same area or another area.  You have a swollen bump in the area.  You have new symptoms.  You feel ill and have muscle aches and pains. Get help right away if:  Your symptoms get worse.  You feel very sleepy.  You throw up (vomit) or have watery poop (diarrhea) for a long time.  You see red streaks coming from the area.  Your red area gets larger.  Your red area turns dark in color. These symptoms may represent a serious problem that is an emergency. Do not wait to see if the symptoms will go away. Get medical help right away. Call your local emergency services (911 in the [place] Do not drive yourself to the hospital. Summary  Cellulitis is a skin infection. The area is often warm, red, swollen, and sore.  This condition is treated with medicines, rest, and cold and warm cloths.  Take all  medicines only as told by your doctor.  Tell your doctor if symptoms do not start to get better after 1-2 days of treatment. This information is not intended to replace advice given to you by your health care provider. Make sure you discuss any questions you have with your health care provider. Document Revised: 04/27/2018 Document Reviewed: 04/27/2018 Elsevier Patient Education  2021 [employer]  Joseph [name],   You were admitted to the hospital for worsening left leg cellulitis on doxycycline and Keflex. Vascular surgery evaluated your leg and determined that your previous stents  remain patent and that there were no new occlusions that needed to be stented in your leg. Strict control of your glucose (goal 120-180) also improved your wound healing while in the hospital.   Your blood pressure was found to be low. You did require midodrine 1 tablet 3 times daily while in the hospital and your home Coreg and Lisinopril were held.   Upon leaving the hospital, please START taking:  - Bactrim 2 tablets in the morning, 2 tablets at night for 7 days total  - Ciprofloxacin 1 tablet in the morning, 1 tablet in the evening for 7 days total  - Apply Bactroban or other antibacterial cream to your wound and cover it with a dressing after cleaning the wound daily - Be sure to take your baby aspirin (81mg ), Plavix, Fenofibrate, Atorvastatin, Midodrine, Lasix, and your diabetic medications as prescribed to assist wound care  - Please apply compression dressings as you are able with medical guidance to keep swelling from pooling in your legs   Please CHANGE your insulin dosing to as follows: - Lantus 46 units in the morning  - Lantus 52 units in the evenings  - Humalog 12 units prior to each of 3 meals you eat (breakfast, lunch, dinner) * Do NOT give 12 units before breakfast/lunch/dinner if you do not eat a meal after injecting  - Please continue checking and documenting your sugars every day with a goal of 120-180 fasting in the mornings and before eating  Please STOP taking:  - Carvedilol  - Lisinopril  - Cephalexin (Keflex)  - Doxycycline  - ASA 325mg  (take 81mg  daily instead) - Sildenafil when you take NTG as these can drop your blood pressures  I have given you a prescription of your chronic oxycodone 15mg  5x daily as well as a short course of Oxycodone IR 5mg  that you may take every 6 hours as needed. You will need to be sure to follow up with your primary care doctor as soon as possible upon leaving the hospital for re-assessment of your wounds, sugars, and blood pressure. You  would benefit from continued wound care as well as close follow up with Dr. as soon as you are able to schedule an appointment. Please return to the ED if you experience worsening redness, pain, swelling, return of fevers or chills, or any other concerning symptoms and call your PCP if you sugars are higher than above goal consistently.   Thank you and we wish you all the best,  Dr. 

## 2021-03-14 NOTE — Progress Notes (Signed)
[facility] [place] PROGRESS NOTE  ASSESSMENT / PLAN: Joseph [name] is a 50 y.o. male with left leg cellulitis and a malperforans ulcer of the left plantar foot. Angiogram shows no evidence of progression of disease or in-stent restenosis. Continue medical therapy for PAD. Follow up with me or Dr. Darrick Penna as an outpatient.   SUBJECTIVE: No complaints. Reviewed angiogram findings.  OBJECTIVE: BP 107/85 (BP Location: Right Arm)   Pulse 79   Temp 97.6 F (36.4 C) (Oral)   Resp 17   Ht 6\' 5"  (1.956 m)   Wt (!) 174 kg   SpO2 96%   BMI 45.48 kg/m   Intake/Output Summary (Last 24 hours) at 03/14/2021 1151 Last data filed at 03/14/2021 0600 Gross per 24 hour  Intake 2013.33 ml  Output --  Net 2013.33 ml    Morbidly obese R groin soft. No pedal pulses L calf cellulitis L pedal malperforans ulcer  CBC Latest Ref Rng & Units 03/14/2021 03/13/2021 03/12/2021  WBC 4.0 - 10.5 K/uL 9.1 6.6 7.9  Hemoglobin 13.0 - 17.0 g/dL 11.1(L) 10.4(L) 10.9(L)  Hematocrit 39.0 - 52.0 % 37.0(L) 32.9(L) 34.5(L)  Platelets 150 - 400 K/uL 293 268 332     CMP Latest Ref Rng & Units 03/14/2021 03/13/2021 03/12/2021  Glucose 70 - 99 mg/dL 03/14/2021) 209(O) 709(G)  BUN 6 - 20 mg/dL 283(M) 62(H) 47(M)  Creatinine 0.61 - 1.24 mg/dL 54(Y) 5.03(T) 4.65(K)  Sodium 135 - 145 mmol/L 141 137 138  Potassium 3.5 - 5.1 mmol/L 4.6 4.6 4.8  Chloride 98 - 111 mmol/L 103 104 104  CO2 22 - 32 mmol/L 29 26 28   Calcium 8.9 - 10.3 mg/dL 9.1 8.12(X) 9.2  Total Protein 6.5 - 8.1 g/dL - - -  Total Bilirubin 0.3 - 1.2 mg/dL - - -  Alkaline Phos 38 - 126 U/L - - -  AST 15 - 41 U/L - - -  ALT 0 - 44 U/L - - -    Estimated Creatinine Clearance: 107.4 mL/min (A) (by C-G formula based on SCr of 1.48 mg/dL (H)).  . 5.1(Z, MD [facility] Phone Number: [phone] 03/14/2021 11:51 AM

## 2021-03-14 NOTE — Progress Notes (Signed)
Physical Therapy Treatment Patient Details Name: Joseph [name] MRN: [id number] DOB: 21-Nov-1971 Today's Date: 03/14/2021    History of Present Illness Pt is a 50 y/o male presesnting on 3/16 secondary to increased pain and swelling in LLE and L foot wound. Pt with recent admission secondary to the same. Thought to be secondary to cellulitis; imaging negative for osteomyelitis. PMH includes L 4th toe amputation, DM, CAD s/p CABG, and DVT/PE.    PT Comments    The pt was seen today by PT to complete stair training and further evaluation of need for continued services at d/c. The pt was able to demo great progress with mobility, ambulation distance, and good self-monitoring of endurance and need for seated rest. The pt demos good safety, stability, and improved tolerance with use of SPC with hallway ambulation (>200 ft) and stairs. The pt was also able to complete 5 steps with use of cane and single rail. Will need BUE for stair management at home, but pt reports family will be able to assist with HHA. The pt was educated on falls prevention, importance of continued mobility, and stair training, pt verbalized understanding of all education. The pt is safe for d/c home with family support from mobility standpoint when medically cleared for d/c, no further acute PT needs identiat this time, thank you for the consult.    Follow Up Recommendations  No PT follow up     Equipment Recommendations  Cane    Recommendations for Other Services       Precautions / Restrictions Precautions Precautions: Fall Precaution Comments: Reports 2 falls to OT (unable to recall when or why he fell). Restrictions Weight Bearing Restrictions: No    Mobility  Bed Mobility Overal bed mobility: Independent                  Transfers Overall transfer level: Modified independent Equipment used: Straight cane Transfers: Sit to/from Stand Sit to Stand: Supervision         General transfer comment:  supervision for safety, but pt standing prior to PT asking him to initiate movement, no LOB or evidence of instability  Ambulation/Gait Ambulation/Gait assistance: Min guard Gait Distance (Feet): 150 Feet Assistive device: Straight cane Gait Pattern/deviations: Step-through pattern;Decreased stance time - left;Antalgic Gait velocity: 0.7 m/s Gait velocity interpretation: 1.31 - 2.62 ft/sec, indicative of limited community ambulator General Gait Details: antalgic pattern with decreased wt acceptance on LLE due to pain. cane in LUE with good stability   Stairs Stairs: Yes Stairs assistance: Min guard Stair Management: One rail Right;With cane;Step to pattern;Forwards Number of Stairs: 5 General stair comments: minG with use of 1 rail and cane in other UE. would need HHA without rail      Balance Overall balance assessment: Mild deficits observed, not formally tested                                          Cognition Arousal/Alertness: Awake/alert Behavior During Therapy: WFL for tasks assessed/performed;Flat affect Overall Cognitive Status: No family/caregiver present to determine baseline cognitive functioning                                 General Comments: WFL during session, able to verbalize needs, good safety awareness      Exercises      General  Comments General comments (skin integrity, edema, etc.): VSS, wound on LLE with clean bandage      Pertinent Vitals/Pain Pain Assessment: Faces Faces Pain Scale: Hurts little more Pain Location: LLE Pain Descriptors / Indicators: Grimacing;Guarding;Shooting Pain Intervention(s): Limited activity within patient's tolerance;Monitored during session           PT Goals (current goals can now be found in the care plan section) Acute Rehab PT Goals Patient Stated Goal: To return home PT Goal Formulation: With patient Time For Goal Achievement: 03/19/21 Potential to Achieve Goals:  Good Progress towards PT goals: Progressing toward goals    Frequency    Min 3X/week      PT Plan Current plan remains appropriate       AM-PAC PT "6 Clicks" Mobility   Outcome Measure  Help needed turning from your back to your side while in a flat bed without using bedrails?: None Help needed moving from lying on your back to sitting on the side of a flat bed without using bedrails?: None Help needed moving to and from a bed to a chair (including a wheelchair)?: A Little Help needed standing up from a chair using your arms (e.g., wheelchair or bedside chair)?: None Help needed to walk in hospital room?: A Little Help needed climbing 3-5 steps with a railing? : A Little 6 Click Score: 21    End of Session Equipment Utilized During Treatment: Gait belt Activity Tolerance: Patient tolerated treatment well Patient left: in chair;with nursing/sitter in room Nurse Communication: Mobility status PT Visit Diagnosis: Difficulty in walking, not elsewhere classified (R26.2);Pain Pain - Right/Left: Left Pain - part of body: Leg;Ankle and joints of foot     Time: 4196-2229 PT Time Calculation (min) (ACUTE ONLY): 18 min  Charges:  $Gait Training: 8-22 mins                     Rolm Baptise, PT, DPT   Acute Rehabilitation Department Pager #: [phone]   Gaetana Michaelis 03/14/2021, 1:46 PM

## 2021-03-14 NOTE — Progress Notes (Signed)
Pharmacy Antibiotic Note  Joseph [name] is a 50 y.o. male admitted on 03/04/2021 with wound infection.  Pharmacy has been consulted for vancomycin and cefepime dosing. Patient was admitted 3/2 for LLE cellulitis and treated with 14 days of doxycycline and Keflex.   Today, patient is afebfrile with WBC wnl. Currently treated with vancomycin. Scr has bounced 1.76-1.5 during this admission.   Angiogram on 3/25 with no further intervention per vascular. Scr 1.41.   Plan: Continue vancomycin 1500 mg Q24H Will evaluate trough level in the AM to ensure new dose is therapuetic  Follow clinical progression and plan for continued abx  Monitor renal function   Temp (24hrs), Avg:97.7 F (36.5 C), Min:97 F (36.1 C), Max:98.5 F (36.9 C)  Recent Labs  Lab 03/10/21 0013 03/11/21 0052 03/11/21 0851 03/12/21 0110 03/13/21 0333 03/14/21 0238  WBC 6.9 6.3  --  7.9 6.6 9.1  CREATININE 1.54* 1.41*  --  1.38* 1.33* 1.48*  VANCOTROUGH  --   --  26*  --   --   --     Estimated Creatinine Clearance: 107.4 mL/min (A) (by C-G formula based on SCr of 1.48 mg/dL (H)).    Allergies  Allergen Reactions  . Sulfa Antibiotics Other (See Comments)    Headaches     Antimicrobials this admission: PTA Keflex (3/3-3/16) PTA Doxycycline (3/3-3/16) 3/17 Cefepime >> 3/19 3/17 Vancomycin >>  Dose adjustments this admission: 3/23 VT 26 on 1.5g IV q12>>1.5g IV q24  Microbiology results:  3/22 Bcx> ngtd  Isaias Sakai, PharmD, MBA Pharmacy Resident [phone] 03/14/2021 8:51 AM

## 2021-03-14 NOTE — TOC Transition Note (Signed)
Transition of Care [facility]) - CM/SW Discharge Note   Patient Details  Name: Joseph [name] MRN: [id number] Date of Birth: June 27, 1971  [facility] CM/SW Contact:  Lawerance Sabal, RN Phone Number: 03/14/2021, 2:21 PM   Clinical Narrative:    Sherron Monday w patient over the phone. Discussed recommendation for cane from PT. He confirmed he needs one for home. Cane to be delivered to room prior to DC         Patient Goals and CMS Choice        Discharge Placement                       Discharge Plan and Services                DME Arranged: Gilmer Mor DME Agency: [employer] Date DME Agency Contacted: 03/14/21 Time DME Agency Contacted: [phone] Representative spoke with at DME Agency: Arnold Long            Social Determinants of Health (SDOH) Interventions     Readmission Risk Interventions No flowsheet data found.

## 2021-03-14 NOTE — Consult Note (Addendum)
[facility]  Total days of antibiotics 10 vanco Reason for Consult: left leg cellulitis/dfu   Referring Physician: narenda  Active Problems:   Dyslipidemia   CAD -S/P PCI 11/02/19    OSA (obstructive sleep apnea)   Insulin dependent type 2 diabetes mellitus (HCC)   Anxiety disorder   PAD (peripheral artery disease) (HCC)   Cellulitis   Cellulitis of left leg    HPI: Joseph [name] is a 50 y.o. male with hx of T2DM, PAD, hx of left SFA and poleteal angioplasty with stent . Hx of left toe amputation with small plantar ulcer. Received intermittent antibiotics.in the last month had worsening erythema to left calf, "red like a tomato" with drainage to anterior tibial region. During this admission, he was started on vancomycin now day 10, and 2 days of cefepime. Re-evaluated by vascular and not needing further stenting. He is improving but not considerably given 10 day course of abtx. Wounds have dried. He was previously on doxycycline prior to admit. He is in care with dr duda to manage plantar ulcer. Patient underwent mri of foot and calf which excluded abscess or osteomyelitis. Blood cx negative but has hx of mrsa colonization in 2021.  Past Medical History:  Diagnosis Date  . Anxiety   . Arthritis    "knees, shoulders, hips, ankles" (07/29/2016)  . Asthma   . CAD (coronary artery disease)    a. 2017: s/p BMS to distal Cx; b. LHC 05/30/2019: 80% mid, distal RCA s/p DES, 30% narrowing of d LM, widely patent LAD w/ luminal irregularities, widely patent stent in dCX  w/ 90+% stenosis distal to stent beofre small trifurcating obtuse marginal (potentially area of restenosis)  . Chronic bronchitis (HCC)   . Chronic ulcer of right great toe (HCC) 09/19/2017  . COPD (chronic obstructive pulmonary disease) (HCC)   . Depression   . DVT (deep venous thrombosis) (HCC)   . GERD (gastroesophageal reflux disease)   . Hyperlipidemia   . Hypertension   . Myocardial  infarction (HCC) 1996   "light one"  . PE (pulmonary embolism) 04/2013   On chronic Xarelto  . Peripheral nerve disease   . Pneumonia "several times"  . Sleep apnea   . Type II diabetes mellitus (HCC)     Allergies:  Allergies  Allergen Reactions  . Sulfa Antibiotics Other (See Comments)    Headaches      MEDICATIONS: . aspirin EC  81 mg Oral Daily  . atorvastatin  80 mg Oral q1800  . buPROPion  150 mg Oral BID  . clopidogrel  75 mg Oral Q breakfast  . [START ON 03/15/2021] cyanocobalamin  1,000 mcg Intramuscular Once  . enoxaparin (LOVENOX) injection  80 mg Subcutaneous Q24H  . escitalopram  10 mg Oral Daily  . fenofibrate  160 mg Oral Daily  . furosemide  40 mg Oral Daily  . insulin aspart  0-20 Units Subcutaneous TID WC  . insulin aspart  12 Units Subcutaneous TID WC  . insulin glargine  46 Units Subcutaneous Daily  . insulin glargine  52 Units Subcutaneous QHS  . midodrine  5 mg Oral TID WC  . mupirocin cream   Topical Daily  . oxyCODONE  15 mg Oral 5 X Daily  . oxyCODONE  5 mg Oral Q6H  . pantoprazole  40 mg Oral Daily  . pregabalin  150 mg Oral QHS  . pregabalin  300 mg Oral Daily  . sodium chloride flush  3 mL Intravenous Q12H    Social History   Tobacco Use  . Smoking status: Former Smoker    Packs/day: 1.00    Years: 34.00    Pack years: 34.00    Types: Cigarettes    Quit date: 07/12/2020    Years since quitting: 0.6  . Smokeless tobacco: Former Neurosurgeon    Types: Snuff, Chew  Vaping Use  . Vaping Use: Never used  Substance Use Topics  . Alcohol use: Yes    Comment: RARE  . Drug use: No    Family History  Problem Relation Age of Onset  . Hypertension Brother   . Hypertension Father   . Diabetes Other   . Hyperlipidemia Other      Review of Systems  Constitutional: Negative for fever, chills, diaphoresis, activity change, appetite change, fatigue and unexpected weight change.  HENT: Negative for congestion, sore throat, rhinorrhea, sneezing,  trouble swallowing and sinus pressure.  Eyes: Negative for photophobia and visual disturbance.  Respiratory: Negative for cough, chest tightness, shortness of breath, wheezing and stridor.  Cardiovascular: Negative for chest pain, palpitations and leg swelling.  Gastrointestinal: Negative for nausea, vomiting, abdominal pain, diarrhea, constipation, blood in stool, abdominal distention and anal bleeding.  Genitourinary: Negative for dysuria, hematuria, flank pain and difficulty urinating.  Musculoskeletal: Negative for myalgias, back pain, joint swelling, arthralgias and gait problem.  Skin:erythema to left leg. Negative for color change, pallor, rash and wound.  Neurological: Negative for dizziness, tremors, weakness and light-headedness.  Hematological: Negative for adenopathy. Does not bruise/bleed easily.  Psychiatric/Behavioral: Negative for behavioral problems, confusion, sleep disturbance, dysphoric mood, decreased concentration and agitation.     OBJECTIVE: Temp:  [97.5 F (36.4 C)-98.5 F (36.9 C)] 97.9 F (36.6 C) (03/26 1157) Pulse Rate:  [79-94] 81 (03/26 1157) Resp:  [16-17] 17 (03/26 1157) BP: (91-126)/(70-89) 101/71 (03/26 1157) SpO2:  [94 %-100 %] 100 % (03/26 1157) Weight:  [174 kg] 174 kg (03/26 0457) Physical Exam  Constitutional: He is oriented to person, place, and time. He appears well-developed and well-nourished. No distress.  HENT:  Mouth/Throat: Oropharynx is clear and moist. No oropharyngeal exudate.  Cardiovascular: Normal rate, regular rhythm and normal heart sounds. Exam reveals no gallop and no friction rub.  No murmur heard.  Pulmonary/Chest: Effort normal and breath sounds normal. No respiratory distress. He has no wheezes.  Abdominal: Soft. Bowel sounds are normal. He exhibits no distension. There is no tenderness.  Lymphadenopathy:  He has no cervical adenopathy.  Neurological: He is alert and oriented to person, place, and time.  Ext: pitting  edema to left leg Skin: Skin is warm and dry. Mild blanching erythema. Small 1cm plantar ulcer , and healing/eschar to anterior tibia (see admit photos) Psychiatric: He has a normal mood and affect. His behavior is normal.    All: sulfa = headache  LABS: Results for orders placed or performed during the hospital encounter of 03/04/21 (from the past 48 hour(s))  Glucose, capillary     Status: Abnormal   Collection Time: 03/12/21  4:19 PM  Result Value Ref Range   Glucose-Capillary 284 (H) 70 - 99 mg/dL    Comment: Glucose reference range applies only to samples taken after fasting for at least 8 hours.  Glucose, capillary     Status: Abnormal   Collection Time: 03/12/21  9:57 PM  Result Value Ref Range   Glucose-Capillary 212 (H) 70 - 99 mg/dL    Comment: Glucose reference range applies only to samples taken  after fasting for at least 8 hours.  Basic metabolic panel     Status: Abnormal   Collection Time: 03/13/21  3:33 AM  Result Value Ref Range   Sodium 137 135 - 145 mmol/L   Potassium 4.6 3.5 - 5.1 mmol/L   Chloride 104 98 - 111 mmol/L   CO2 26 22 - 32 mmol/L   Glucose, Bld 146 (H) 70 - 99 mg/dL    Comment: Glucose reference range applies only to samples taken after fasting for at least 8 hours.   BUN 30 (H) 6 - 20 mg/dL   Creatinine, Ser 3.41 (H) 0.61 - 1.24 mg/dL   Calcium 8.8 (L) 8.9 - 10.3 mg/dL   GFR, Estimated >93 >79 mL/min    Comment: (NOTE) Calculated using the CKD-EPI Creatinine Equation (2021)    Anion gap 7 5 - 15    Comment: Performed at [facility], [address] [place], [name] [address]  CBC     Status: Abnormal   Collection Time: 03/13/21  3:33 AM  Result Value Ref Range   WBC 6.6 4.0 - 10.5 K/uL   RBC 4.07 (L) 4.22 - 5.81 MIL/uL   Hemoglobin 10.4 (L) 13.0 - 17.0 g/dL   HCT 73.5 (L) 32.9 - 92.4 %   MCV 80.8 80.0 - 100.0 fL   MCH 25.6 (L) 26.0 - 34.0 pg   MCHC 31.6 30.0 - 36.0 g/dL   RDW 26.8 34.1 - 96.2 %   Platelets 268 150 - 400 K/uL    nRBC 0.0 0.0 - 0.2 %    Comment: Performed at [facility], [address], [place], [name] [address]  Glucose, capillary     Status: Abnormal   Collection Time: 03/13/21  6:12 AM  Result Value Ref Range   Glucose-Capillary 130 (H) 70 - 99 mg/dL    Comment: Glucose reference range applies only to samples taken after fasting for at least 8 hours.  Glucose, capillary     Status: None   Collection Time: 03/13/21  9:42 AM  Result Value Ref Range   Glucose-Capillary 91 70 - 99 mg/dL    Comment: Glucose reference range applies only to samples taken after fasting for at least 8 hours.  Glucose, capillary     Status: None   Collection Time: 03/13/21 12:45 PM  Result Value Ref Range   Glucose-Capillary 82 70 - 99 mg/dL    Comment: Glucose reference range applies only to samples taken after fasting for at least 8 hours.  Glucose, capillary     Status: None   Collection Time: 03/13/21  4:17 PM  Result Value Ref Range   Glucose-Capillary 98 70 - 99 mg/dL    Comment: Glucose reference range applies only to samples taken after fasting for at least 8 hours.   Comment 1 Notify RN   Glucose, capillary     Status: Abnormal   Collection Time: 03/13/21  9:22 PM  Result Value Ref Range   Glucose-Capillary 238 (H) 70 - 99 mg/dL    Comment: Glucose reference range applies only to samples taken after fasting for at least 8 hours.  Basic metabolic panel     Status: Abnormal   Collection Time: 03/14/21  2:38 AM  Result Value Ref Range   Sodium 141 135 - 145 mmol/L   Potassium 4.6 3.5 - 5.1 mmol/L   Chloride 103 98 - 111 mmol/L   CO2 29 22 - 32 mmol/L   Glucose, Bld 181 (  H) 70 - 99 mg/dL    Comment: Glucose reference range applies only to samples taken after fasting for at least 8 hours.   BUN 27 (H) 6 - 20 mg/dL   Creatinine, Ser 2.94 (H) 0.61 - 1.24 mg/dL   Calcium 9.1 8.9 - 76.5 mg/dL   GFR, Estimated 58 (L) >60 mL/min    Comment: (NOTE) Calculated using the CKD-EPI Creatinine Equation  (2021)    Anion gap 9 5 - 15    Comment: Performed at [facility], [address], [place], [name] [address]  CBC     Status: Abnormal   Collection Time: 03/14/21  2:38 AM  Result Value Ref Range   WBC 9.1 4.0 - 10.5 K/uL   RBC 4.37 4.22 - 5.81 MIL/uL   Hemoglobin 11.1 (L) 13.0 - 17.0 g/dL   HCT 54.6 (L) 56.8 - 12.7 %   MCV 84.7 80.0 - 100.0 fL   MCH 25.4 (L) 26.0 - 34.0 pg   MCHC 30.0 30.0 - 36.0 g/dL   RDW 51.7 00.1 - 74.9 %   Platelets 293 150 - 400 K/uL   nRBC 0.0 0.0 - 0.2 %    Comment: Performed at [facility], [address], [place], [name] [address]  Magnesium     Status: None   Collection Time: 03/14/21  2:38 AM  Result Value Ref Range   Magnesium 1.7 1.7 - 2.4 mg/dL    Comment: Performed at [facility], [address] [place], [name] [address]  Glucose, capillary     Status: Abnormal   Collection Time: 03/14/21  6:02 AM  Result Value Ref Range   Glucose-Capillary 158 (H) 70 - 99 mg/dL    Comment: Glucose reference range applies only to samples taken after fasting for at least 8 hours.  Glucose, capillary     Status: Abnormal   Collection Time: 03/14/21 11:28 AM  Result Value Ref Range   Glucose-Capillary 160 (H) 70 - 99 mg/dL    Comment: Glucose reference range applies only to samples taken after fasting for at least 8 hours.    MICRO: reviewed IMAGING: HYBRID OR IMAGING ([facility] ONLY)  Result Date: 03/13/2021 There is no interpretation for this exam.  This order is for images obtained during a surgical procedure.  Please See "Surgeries" Tab for more information regarding the procedure.   Assessment/Plan:  50yo M T2DM, PAD, ckd3, hx of left toe amputation admitted for moderate cellulitis, swelling,*& moderate dfu to left lower leg/foot. Day 10 of vancomycin, with slow response to therapy  - would discharge him on anti-pseudomonal and mrsa coverage x 7 more days: cipro 750 BID plus bactrim ds  bid (see if he could tolerate 7 days even with  headache) otherwise doxy 100mg  bid - follow up with dr - question that he may benefit from compression wrapping initially to minimize LE edema/?lymphadema  PAD= continue on plavix and aspirin  T2dM =important to have sugars under control to help with wound healing

## 2021-03-15 LAB — CULTURE, BLOOD (ROUTINE X 2)
Culture: NO GROWTH
Culture: NO GROWTH
Special Requests: ADEQUATE
Special Requests: ADEQUATE

## 2021-03-15 NOTE — Discharge Planning (Incomplete Revision)
Name: Joseph [name] MRN: [id number] DOB: 05/24/1971 50 y.o. PCP: [name], NP  Date of Admission: 03/04/2021  8:25 PM Date of Discharge: 03/14/2021  Attending Physician: Dr. Jimmye Norman  DISCHARGE DIAGNOSIS:  Principal Problem:   Cellulitis of left leg Active Problems:   Dyslipidemia   CAD -S/P PCI 11/02/19    OSA (obstructive sleep apnea)   Insulin dependent type 2 diabetes mellitus (HCC)   Anxiety disorder   PAD (peripheral artery disease) ([place])   Cellulitis    DISCHARGE MEDICATIONS:   Allergies as of 03/14/2021      Reactions   Sulfa Antibiotics Other (See Comments)   Headaches      Medication List    STOP taking these medications   carvedilol 3.125 MG tablet Commonly known as: COREG   cephALEXin 500 MG capsule Commonly known as: KEFLEX   doxycycline 100 MG tablet Commonly known as: VIBRA-TABS   lisinopril 10 MG tablet Commonly known as: ZESTRIL   sildenafil 20 MG tablet Commonly known as: REVATIO     TAKE these medications   albuterol 108 (90 Base) MCG/ACT inhaler Commonly known as: VENTOLIN HFA Inhale 2 puffs into the lungs every 6 (six) hours as needed for wheezing or shortness of breath.   ALPRAZolam 1 MG tablet Commonly known as: XANAX Take 1 mg by mouth See admin instructions. 1 tablet by mouth twice daily and 2 tabs at bedtime.   aspirin EC 81 MG tablet Take 1 tablet (81 mg total) by mouth daily. Swallow whole. What changed: Another medication with the same name was removed. Continue taking this medication, and follow the directions you see here.   atorvastatin 80 MG tablet Commonly known as: LIPITOR Take 1 tablet (80 mg total) by mouth daily at 6 PM.   blood glucose meter kit and supplies Kit Dispense based on patient and insurance preference. Use up to four times daily as directed. (FOR ICD-9 250.00, 250.01).   buPROPion 150 MG 12 hr tablet Commonly known as: WELLBUTRIN SR Take 150 mg by mouth 2 (two) times daily.    ciprofloxacin 500 MG tablet Commonly known as: Cipro Take 1 tablet (500 mg total) by mouth 2 (two) times daily for 7 days.   clopidogrel 75 MG tablet Commonly known as: PLAVIX Take 1 tablet (75 mg total) by mouth daily with breakfast.   escitalopram 10 MG tablet Commonly known as: LEXAPRO Take 10 mg by mouth in the morning.   fenofibrate 145 MG tablet Commonly known as: TRICOR Take 145 mg by mouth in the morning.   Flovent Diskus 250 MCG/BLIST Aepb Generic drug: Fluticasone Propionate (Inhal) Inhale 2 puffs into the lungs in the morning and at bedtime.   fluconazole 150 MG tablet Commonly known as: DIFLUCAN Take 150 mg by mouth every Monday.   fluticasone 50 MCG/ACT nasal spray Commonly known as: FLONASE Place 2 sprays into both nostrils in the morning.   FreeStyle Libre 14 Day Sensor Misc Inject 1 patch into the skin every 14 (fourteen) days.   furosemide 40 MG tablet Commonly known as: LASIX Take 1 tablet (40 mg total) by mouth daily.   insulin lispro 100 UNIT/ML KwikPen Commonly known as: HumaLOG KwikPen Inject 12 Units into the skin 3 (three) times daily. What changed: how much to take   Lantus SoloStar 100 UNIT/ML Solostar Pen Generic drug: insulin glargine Inject 46 Units into the skin daily before breakfast AND 52 Units at bedtime. What changed: See the new instructions.   meclizine 25 MG  tablet Commonly known as: ANTIVERT Take 25 mg by mouth 3 (three) times daily as needed for dizziness. Take 50 mg by mouth in the morning & 25 mg by mouth at night   metFORMIN 1000 MG tablet Commonly known as: GLUCOPHAGE Take 1 tablet (1,000 mg total) by mouth 2 (two) times daily with a meal.   midodrine 5 MG tablet Commonly known as: PROAMATINE Take 5 mg by mouth 3 (three) times daily with meals.   mupirocin cream 2 % Commonly known as: BACTROBAN Apply topically daily.   naloxone 4 MG/0.1ML Liqd nasal spray kit Commonly known as: NARCAN Place 4 mg into the nose  as needed (overdose).   nitroGLYCERIN 0.4 MG SL tablet Commonly known as: NITROSTAT Place 0.4 mg under the tongue every 5 (five) minutes as needed for chest pain.   oxyCODONE 15 MG immediate release tablet Commonly known as: ROXICODONE Take 1 tablet (15 mg total) by mouth 5 (five) times daily. What changed: Another medication with the same name was added. Make sure you understand how and when to take each.   oxyCODONE 5 MG immediate release tablet Commonly known as: Oxy IR/ROXICODONE Take 1 tablet (5 mg total) by mouth every 6 (six) hours as needed for up to 3 days for severe pain or breakthrough pain. What changed: You were already taking a medication with the same name, and this prescription was added. Make sure you understand how and when to take each.   pantoprazole 40 MG tablet Commonly known as: PROTONIX Take 1 tablet (40 mg total) by mouth daily. What changed:   how much to take  when to take this   pregabalin 150 MG capsule Commonly known as: LYRICA Take 150 mg by mouth 3 (three) times daily.   promethazine 25 MG tablet Commonly known as: PHENERGAN Take 25 mg by mouth every 4 (four) hours as needed for nausea or vomiting.   sulfamethoxazole-trimethoprim 800-160 MG tablet Commonly known as: BACTRIM DS Take 2 tablets by mouth 2 (two) times daily.   Victoza 18 MG/3ML Sopn Generic drug: liraglutide Inject 1.2 mg into the skin at bedtime.            Discharge Care Instructions  (From admission, onward)         Start     Ordered   03/14/21 0000  Discharge wound care:       Comments: Please see above instructions - apply antibacterial every day then cover with dressing. Keep clean.   03/14/21 1744          DISPOSITION AND FOLLOW-UP:  [name] was discharged from [facility] in Good condition.  At the hospital follow up visit please address:  Follow-up Recommendations:  A. Cellulitis: Recommendations: Make sure patient  finishes 7 day course of cipro and bactrim and follow up with wound care.  Pending work-up: none Labs: CBC, Imaging: none  B. PAD: Recommendations: Continue DAPT. Follow up with Dr. Sharol Given. Pending work-up: none Labs: none, Imaging: none  C. IDDM: Recommendations: Continue tight control of blood glucose for better wound healing. Titrate insulin.  Pending work-up: none Labs: CBG and BMP (for kindey function), Imaging: none  D. HFpEF: Recommendations: reevaluate fluid status and redose lasix accordingly Pending work-up: none Labs: none, Imaging: none   E. CKD: Recommendations: make sure kidney function is stable. Please recheck his electrolytes. Pending work-up: none Labs: Basic Metabolic Profile, MG, Phos Imaging: none   F. B12 def.: Recommendations: oral supplementation of B12 needed Pending  work-up: none Labs: none, Imaging: none  G. Hypotension: Recommendations: continue midodrine Pending work-up: none Labs: none, Imaging: none   Important changes since admission: Discharged with DAPT and 7 day course of cipro and flagyl   Follow-up Appointments:  Follow-up Information    [name], NP. Go in 3 day(s).   Why: P Contact information: [place] [address] [phone]               HOSPITAL COURSE:  Patient Summary:  Purulent LLE Cellulitis in the setting of Multiple Diabetic Foot/Shin Wounds and Moderate PAD of the LLE s/p revascularization of the L SFA and L popliteal arteries 09/2020 Absence of radial pulse in RUE, with small distal lesions in bilateral hands, Concerning for Ischemia Patient presented with left lower extremity cellulitis with erythema that extended to his knee associated warmth.  There was concern for underlying peripheral artery disease.  Vascular surgery was consulted and they performed an aortogram that did not show any reversible ischemic lesions.  Patient was treated with broad-spectrum antibiotics while  in the hospital and transition to p.o. Cipro and Flagyl for an additional 7-day course in the outpatient setting.  Patient was instructed to follow-up with Dr. Sharol Given and wound care in the outpatient setting.  He was also started on dual antiplatelet therapy due to his underlying peripheral artery disease.  Insulin-dependent diabetes, type II Patient has significant type 2 diabetes with extremely high insulin requirements.  Please continue to titrate his insulin and diabetic medications in the outpatient setting to get better control of his hyperglycemia and better wound healing.  He likely has significant microvascular ischemia contributing to his poor wound healing.   HFpEF: This was stable during this hospitalization.  He will need further evaluation and management of his fluid status and redosing of his Lasix in the outpatient setting.  B12 deficiency: Patient was found to have significantly low B12 and was given injection supplementation while in the hospital.  He will need oral supplementation in the outpatient setting.  Please set the patient up with oral B12  DISCHARGE INSTRUCTIONS:   Discharge Instructions    (HEART FAILURE PATIENTS) Call MD:  Anytime you have any of the following symptoms: 1) 3 pound weight gain in 24 hours or 5 pounds in 1 week 2) shortness of breath, with or without a dry hacking cough 3) swelling in the hands, feet or stomach 4) if you have to sleep on extra pillows at night in order to breathe.   Complete by: As directed    Call MD for:  difficulty breathing, headache or visual disturbances   Complete by: As directed    Call MD for:  extreme fatigue   Complete by: As directed    Call MD for:  persistant dizziness or light-headedness   Complete by: As directed    Call MD for:  redness, tenderness, or signs of infection (pain, swelling, redness, odor or green/yellow discharge around incision site)   Complete by: As directed    Call MD for:  severe uncontrolled pain    Complete by: As directed    Call MD for:  temperature >100.4   Complete by: As directed    Diet - low sodium heart healthy   Complete by: As directed    Diet Carb Modified   Complete by: As directed    Discharge wound care:   Complete by: As directed    Please see above instructions - apply antibacterial every day then cover  with dressing. Keep clean.   Increase activity slowly   Complete by: As directed       SUBJECTIVE:  Discharge Vitals:   BP 101/71 (BP Location: Right Arm)   Pulse 81   Temp 97.9 F (36.6 C) (Oral)   Resp 17   Ht _0  (1.956 m)   Wt (!) 174 kg   SpO2 100%   BMI 45.48 kg/m   OBJECTIVE:   Pertinent Labs, Studies, and Procedures:  CBC Latest Ref Rng & Units 03/14/2021 03/13/2021 03/12/2021  WBC 4.0 - 10.5 K/uL 9.1 6.6 7.9  Hemoglobin 13.0 - 17.0 g/dL 11.1(L) 10.4(L) 10.9(L)  Hematocrit 39.0 - 52.0 % 37.0(L) 32.9(L) 34.5(L)  Platelets 150 - 400 K/uL 293 268 332    CMP Latest Ref Rng & Units 03/14/2021 03/13/2021 03/12/2021  Glucose 70 - 99 mg/dL 181(H) 146(H) 132(H)  BUN 6 - 20 mg/dL 27(H) 30(H) 33(H)  Creatinine 0.61 - 1.24 mg/dL 1.48(H) 1.33(H) 1.38(H)  Sodium 135 - 145 mmol/L 141 137 138  Potassium 3.5 - 5.1 mmol/L 4.6 4.6 4.8  Chloride 98 - 111 mmol/L 103 104 104  CO2 22 - 32 mmol/L _1 Calcium 8.9 - 10.3 mg/dL 9.1 8.8(L) 9.2  Total Protein 6.5 - 8.1 g/dL - - -  Total Bilirubin 0.3 - 1.2 mg/dL - - -  Alkaline Phos 38 - 126 U/L - - -  AST 15 - 41 U/L - - -  ALT 0 - 44 U/L - - -    DG Tibia/Fibula Left  Result Date: 03/04/2021 CLINICAL DATA:  Lower left leg wound. EXAM: LEFT TIBIA AND FIBULA - 2 VIEW COMPARISON:  February 18, 2021 FINDINGS: There is no evidence of fracture or other focal bone lesions. Moderate severity medial and lateral tibiofemoral compartment space narrowing is seen. A radiopaque vascular stent is seen within the region of the left popliteal fossa. IMPRESSION: 1. Moderate severity degenerative changes of the left knee.  Electronically Signed   By: Virgina Norfolk M.D.   On: 03/04/2021 23:42   MR TIBIA FIBULA LEFT WO CONTRAST  Result Date: 03/05/2021 CLINICAL DATA:  Left lower leg redness and swelling in a diabetic patient. EXAM: MRI OF LOWER LEFT EXTREMITY WITHOUT CONTRAST; MRI OF THE LEFT FOOT WITHOUT CONTRAST TECHNIQUE: Multiplanar, multisequence MR imaging of the left lower leg and foot was performed. No intravenous contrast was administered. COMPARISON:  Plain films left lower leg 03/04/2021. Plain films left foot 08/07/2020. FINDINGS: Bones/Joint/Cartilage There is no marrow edema to suggest osteomyelitis. No fracture, stress change or worrisome lesion is identified. The patient is status post amputation at the level of the mid to distal diaphysis of the left fourth metatarsal. Ligaments Negative. Muscles and Tendons There is moderate fatty atrophy of lower leg musculature bilaterally. Please note that the right lower leg is partially imaged on the coronal series. No intramuscular fluid collection is seen. No strain or tear. Soft tissues Subcutaneous edema in the lower legs bilaterally is worse on the left where it is intense. There is also intense subcutaneous edema about the left foot. IMPRESSION: Left worse than right lower leg subcutaneous edema and subcutaneous edema over the dorsum of the foot could be due to dependent change or cellulitis. Negative for abscess, osteomyelitis, myositis or septic joint. Status post amputation at the level of the mid to distal diaphysis of the fourth metatarsal. Electronically Signed   By: Inge Rise M.D.   On: 03/05/2021 13:49   MR FOOT LEFT WO CONTRAST  Result Date: 03/05/2021 CLINICAL DATA:  Left lower leg redness and swelling in a diabetic patient. EXAM: MRI OF LOWER LEFT EXTREMITY WITHOUT CONTRAST; MRI OF THE LEFT FOOT WITHOUT CONTRAST TECHNIQUE: Multiplanar, multisequence MR imaging of the left lower leg and foot was performed. No intravenous contrast was  administered. COMPARISON:  Plain films left lower leg 03/04/2021. Plain films left foot 08/07/2020. FINDINGS: Bones/Joint/Cartilage There is no marrow edema to suggest osteomyelitis. No fracture, stress change or worrisome lesion is identified. The patient is status post amputation at the level of the mid to distal diaphysis of the left fourth metatarsal. Ligaments Negative. Muscles and Tendons There is moderate fatty atrophy of lower leg musculature bilaterally. Please note that the right lower leg is partially imaged on the coronal series. No intramuscular fluid collection is seen. No strain or tear. Soft tissues Subcutaneous edema in the lower legs bilaterally is worse on the left where it is intense. There is also intense subcutaneous edema about the left foot. IMPRESSION: Left worse than right lower leg subcutaneous edema and subcutaneous edema over the dorsum of the foot could be due to dependent change or cellulitis. Negative for abscess, osteomyelitis, myositis or septic joint. Status post amputation at the level of the mid to distal diaphysis of the fourth metatarsal. Electronically Signed   By: Inge Rise M.D.   On: 03/05/2021 13:49   VAS [place] ABI WITH/WO TBI  Result Date: 03/05/2021 LOWER EXTREMITY DOPPLER STUDY Indications: Ulceration, peripheral artery disease, and Cellulitis.  Limitations: Today's exam was limited due to an open wound. Comparison Study: Prior ABI done 12/29/20 Performing Technologist: Sharion Dove RVS  Examination Guidelines: A complete evaluation includes at minimum, Doppler waveform signals and systolic blood pressure reading at the level of bilateral brachial, anterior tibial, and posterior tibial arteries, when vessel segments are accessible. Bilateral testing is considered an integral part of a complete examination. Photoelectric Plethysmograph (PPG) waveforms and toe systolic pressure readings are included as required and additional duplex testing as needed. Limited  examinations for reoccurring indications may be performed as noted.  ABI Findings: +---------+------------------+-----+-----------+----------+ Right    Rt Pressure (mmHg)IndexWaveform   Comment    +---------+------------------+-----+-----------+----------+ Brachial                        multiphasic           +---------+------------------+-----+-----------+----------+ PTA      131               1.01 multiphasic           +---------+------------------+-----+-----------+----------+ DP       124               0.95 multiphasic           +---------+------------------+-----+-----------+----------+ Great Toe                                  amputation +---------+------------------+-----+-----------+----------+ +---------+------------------+-----+-----------+-------+ Left     Lt Pressure (mmHg)IndexWaveform   Comment +---------+------------------+-----+-----------+-------+ Brachial 130                    multiphasic        +---------+------------------+-----+-----------+-------+ PTA                             multiphasic        +---------+------------------+-----+-----------+-------+ DP  multiphasic        +---------+------------------+-----+-----------+-------+ Great Toe65                0.50                    +---------+------------------+-----+-----------+-------+ +-------+-----------+-----------+------------+------------+ ABI/TBIToday's ABIToday's TBIPrevious ABIPrevious TBI +-------+-----------+-----------+------------+------------+ Right  1.01                  0.81                     +-------+-----------+-----------+------------+------------+ Left              0.5        0.84        0.66         +-------+-----------+-----------+------------+------------+ Right ABIs appear increased. Left TBIs appear decreased.  Summary: Right: Resting right ankle-brachial index is within normal range. No evidence of significant  right lower extremity arterial disease. Left: The left toe-brachial index is abnormal. Unable to obtain pressures secondary to open wound.  *See table(s) above for measurements and observations.  Electronically signed by Servando Snare MD on 03/05/2021 at 5:51:46 PM.    Final    VAS [place] LOWER EXTREMITY VENOUS (DVT)  Result Date: 03/05/2021  Lower Venous DVT Study Indications: Swelling, Pain, Erythema, and Cellulitis.  Comparison Study: Prior negative study done 02/18/21 Performing Technologist: Sharion Dove RVS  Examination Guidelines: A complete evaluation includes B-mode imaging, spectral Doppler, color Doppler, and power Doppler as needed of all accessible portions of each vessel. Bilateral testing is considered an integral part of a complete examination. Limited examinations for reoccurring indications may be performed as noted. The reflux portion of the exam is performed with the patient in reverse Trendelenburg.  +-----+---------------+---------+-----------+----------+--------------+ RIGHTCompressibilityPhasicitySpontaneityPropertiesThrombus Aging +-----+---------------+---------+-----------+----------+--------------+ CFV  Full           Yes      Yes                                 +-----+---------------+---------+-----------+----------+--------------+   +---------+---------------+---------+-----------+----------+--------------+ LEFT     CompressibilityPhasicitySpontaneityPropertiesThrombus Aging +---------+---------------+---------+-----------+----------+--------------+ CFV      Full           Yes      Yes                                 +---------+---------------+---------+-----------+----------+--------------+ SFJ      Full                                                        +---------+---------------+---------+-----------+----------+--------------+ FV Prox  Full                                                         +---------+---------------+---------+-----------+----------+--------------+ FV Mid   Full                                                        +---------+---------------+---------+-----------+----------+--------------+  FV DistalFull                                                        +---------+---------------+---------+-----------+----------+--------------+ PFV      Full                                                        +---------+---------------+---------+-----------+----------+--------------+ POP      Full           Yes      Yes                                 +---------+---------------+---------+-----------+----------+--------------+ PTV      Full                                                        +---------+---------------+---------+-----------+----------+--------------+ PERO     Full                                                        +---------+---------------+---------+-----------+----------+--------------+     Summary: RIGHT: - No evidence of common femoral vein obstruction.  LEFT: - There is no evidence of deep vein thrombosis in the lower extremity.  - Ultrasound characteristics of enlarged lymph nodes noted in the groin.  *See table(s) above for measurements and observations. Electronically signed by Servando Snare MD on 03/05/2021 at 5:53:24 PM.    Final      Signed: Lawerance Cruel, D.O.  Internal Medicine Resident, PGY-2 [facility]  Pager: [phone] 4:35 PM, 03/15/2021

## 2021-03-15 NOTE — Discharge Planning (Cosign Needed)
Name: Joseph [name] MRN: [id number] DOB: 05/24/1971 50 y.o. PCP: [name], NP  Date of Admission: 03/04/2021  8:25 PM Date of Discharge: 03/14/2021  Attending Physician: Dr. Jimmye Norman  DISCHARGE DIAGNOSIS:  Principal Problem:   Cellulitis of left leg Active Problems:   Dyslipidemia   CAD -S/P PCI 11/02/19    OSA (obstructive sleep apnea)   Insulin dependent type 2 diabetes mellitus (HCC)   Anxiety disorder   PAD (peripheral artery disease) ([place])   Cellulitis    DISCHARGE MEDICATIONS:   Allergies as of 03/14/2021      Reactions   Sulfa Antibiotics Other (See Comments)   Headaches      Medication List    STOP taking these medications   carvedilol 3.125 MG tablet Commonly known as: COREG   cephALEXin 500 MG capsule Commonly known as: KEFLEX   doxycycline 100 MG tablet Commonly known as: VIBRA-TABS   lisinopril 10 MG tablet Commonly known as: ZESTRIL   sildenafil 20 MG tablet Commonly known as: REVATIO     TAKE these medications   albuterol 108 (90 Base) MCG/ACT inhaler Commonly known as: VENTOLIN HFA Inhale 2 puffs into the lungs every 6 (six) hours as needed for wheezing or shortness of breath.   ALPRAZolam 1 MG tablet Commonly known as: XANAX Take 1 mg by mouth See admin instructions. 1 tablet by mouth twice daily and 2 tabs at bedtime.   aspirin EC 81 MG tablet Take 1 tablet (81 mg total) by mouth daily. Swallow whole. What changed: Another medication with the same name was removed. Continue taking this medication, and follow the directions you see here.   atorvastatin 80 MG tablet Commonly known as: LIPITOR Take 1 tablet (80 mg total) by mouth daily at 6 PM.   blood glucose meter kit and supplies Kit Dispense based on patient and insurance preference. Use up to four times daily as directed. (FOR ICD-9 250.00, 250.01).   buPROPion 150 MG 12 hr tablet Commonly known as: WELLBUTRIN SR Take 150 mg by mouth 2 (two) times daily.    ciprofloxacin 500 MG tablet Commonly known as: Cipro Take 1 tablet (500 mg total) by mouth 2 (two) times daily for 7 days.   clopidogrel 75 MG tablet Commonly known as: PLAVIX Take 1 tablet (75 mg total) by mouth daily with breakfast.   escitalopram 10 MG tablet Commonly known as: LEXAPRO Take 10 mg by mouth in the morning.   fenofibrate 145 MG tablet Commonly known as: TRICOR Take 145 mg by mouth in the morning.   Flovent Diskus 250 MCG/BLIST Aepb Generic drug: Fluticasone Propionate (Inhal) Inhale 2 puffs into the lungs in the morning and at bedtime.   fluconazole 150 MG tablet Commonly known as: DIFLUCAN Take 150 mg by mouth every Monday.   fluticasone 50 MCG/ACT nasal spray Commonly known as: FLONASE Place 2 sprays into both nostrils in the morning.   FreeStyle Libre 14 Day Sensor Misc Inject 1 patch into the skin every 14 (fourteen) days.   furosemide 40 MG tablet Commonly known as: LASIX Take 1 tablet (40 mg total) by mouth daily.   insulin lispro 100 UNIT/ML KwikPen Commonly known as: HumaLOG KwikPen Inject 12 Units into the skin 3 (three) times daily. What changed: how much to take   Lantus SoloStar 100 UNIT/ML Solostar Pen Generic drug: insulin glargine Inject 46 Units into the skin daily before breakfast AND 52 Units at bedtime. What changed: See the new instructions.   meclizine 25 MG  tablet Commonly known as: ANTIVERT Take 25 mg by mouth 3 (three) times daily as needed for dizziness. Take 50 mg by mouth in the morning & 25 mg by mouth at night   metFORMIN 1000 MG tablet Commonly known as: GLUCOPHAGE Take 1 tablet (1,000 mg total) by mouth 2 (two) times daily with a meal.   midodrine 5 MG tablet Commonly known as: PROAMATINE Take 5 mg by mouth 3 (three) times daily with meals.   mupirocin cream 2 % Commonly known as: BACTROBAN Apply topically daily.   naloxone 4 MG/0.1ML Liqd nasal spray kit Commonly known as: NARCAN Place 4 mg into the nose  as needed (overdose).   nitroGLYCERIN 0.4 MG SL tablet Commonly known as: NITROSTAT Place 0.4 mg under the tongue every 5 (five) minutes as needed for chest pain.   oxyCODONE 15 MG immediate release tablet Commonly known as: ROXICODONE Take 1 tablet (15 mg total) by mouth 5 (five) times daily. What changed: Another medication with the same name was added. Make sure you understand how and when to take each.   oxyCODONE 5 MG immediate release tablet Commonly known as: Oxy IR/ROXICODONE Take 1 tablet (5 mg total) by mouth every 6 (six) hours as needed for up to 3 days for severe pain or breakthrough pain. What changed: You were already taking a medication with the same name, and this prescription was added. Make sure you understand how and when to take each.   pantoprazole 40 MG tablet Commonly known as: PROTONIX Take 1 tablet (40 mg total) by mouth daily. What changed:   how much to take  when to take this   pregabalin 150 MG capsule Commonly known as: LYRICA Take 150 mg by mouth 3 (three) times daily.   promethazine 25 MG tablet Commonly known as: PHENERGAN Take 25 mg by mouth every 4 (four) hours as needed for nausea or vomiting.   sulfamethoxazole-trimethoprim 800-160 MG tablet Commonly known as: BACTRIM DS Take 2 tablets by mouth 2 (two) times daily.   Victoza 18 MG/3ML Sopn Generic drug: liraglutide Inject 1.2 mg into the skin at bedtime.            Discharge Care Instructions  (From admission, onward)         Start     Ordered   03/14/21 0000  Discharge wound care:       Comments: Please see above instructions - apply antibacterial every day then cover with dressing. Keep clean.   03/14/21 1744          DISPOSITION AND FOLLOW-UP:  [name] was discharged from [facility] in Good condition.  At the hospital follow up visit please address:  Follow-up Recommendations:  A. Cellulitis: Recommendations: Make sure patient  finishes 7 day course of cipro and bactrim and follow up with wound care.  Pending work-up: none Labs: CBC, Imaging: none  B. PAD: Recommendations: Continue DAPT. Follow up with Dr. Sharol Given. Pending work-up: none Labs: none, Imaging: none  C. IDDM: Recommendations: Continue tight control of blood glucose for better wound healing. Titrate insulin.  Pending work-up: none Labs: CBG and BMP (for kindey function), Imaging: none  D. HFpEF: Recommendations: reevaluate fluid status and redose lasix accordingly Pending work-up: none Labs: none, Imaging: none   E. CKD: Recommendations: make sure kidney function is stable. Please recheck his electrolytes. Pending work-up: none Labs: Basic Metabolic Profile, MG, Phos Imaging: none   F. B12 def.: Recommendations: oral supplementation of B12 needed Pending  work-up: none Labs: none, Imaging: none  G. Hypotension: Recommendations: continue midodrine Pending work-up: none Labs: none, Imaging: none   Important changes since admission: Discharged with DAPT and 7 day course of cipro and flagyl   Follow-up Appointments:  Follow-up Information    [name], NP. Go in 3 day(s).   Why: P Contact information: [place] [address] [phone]               HOSPITAL COURSE:  Patient Summary:  Purulent LLE Cellulitis in the setting of Multiple Diabetic Foot/Shin Wounds and Moderate PAD of the LLE s/p revascularization of the L SFA and L popliteal arteries 09/2020 Absence of radial pulse in RUE, with small distal lesions in bilateral hands, Concerning for Ischemia Patient presented with left lower extremity cellulitis with erythema that extended to his knee associated warmth.  There was concern for underlying peripheral artery disease.  Vascular surgery was consulted and they performed an aortogram that did not show any reversible ischemic lesions.  Patient was treated with broad-spectrum antibiotics while  in the hospital and transition to p.o. Cipro and Flagyl for an additional 7-day course in the outpatient setting.  Patient was instructed to follow-up with Dr. Sharol Given and wound care in the outpatient setting.  He was also started on dual antiplatelet therapy due to his underlying peripheral artery disease.  Insulin-dependent diabetes, type II Patient has significant type 2 diabetes with extremely high insulin requirements.  Please continue to titrate his insulin and diabetic medications in the outpatient setting to get better control of his hyperglycemia and better wound healing.  He likely has significant microvascular ischemia contributing to his poor wound healing.   HFpEF: This was stable during this hospitalization.  He will need further evaluation and management of his fluid status and redosing of his Lasix in the outpatient setting.  B12 deficiency: Patient was found to have significantly low B12 and was given injection supplementation while in the hospital.  He will need oral supplementation in the outpatient setting.  Please set the patient up with oral B12  DISCHARGE INSTRUCTIONS:   Discharge Instructions    (HEART FAILURE PATIENTS) Call MD:  Anytime you have any of the following symptoms: 1) 3 pound weight gain in 24 hours or 5 pounds in 1 week 2) shortness of breath, with or without a dry hacking cough 3) swelling in the hands, feet or stomach 4) if you have to sleep on extra pillows at night in order to breathe.   Complete by: As directed    Call MD for:  difficulty breathing, headache or visual disturbances   Complete by: As directed    Call MD for:  extreme fatigue   Complete by: As directed    Call MD for:  persistant dizziness or light-headedness   Complete by: As directed    Call MD for:  redness, tenderness, or signs of infection (pain, swelling, redness, odor or green/yellow discharge around incision site)   Complete by: As directed    Call MD for:  severe uncontrolled pain    Complete by: As directed    Call MD for:  temperature >100.4   Complete by: As directed    Diet - low sodium heart healthy   Complete by: As directed    Diet Carb Modified   Complete by: As directed    Discharge wound care:   Complete by: As directed    Please see above instructions - apply antibacterial every day then cover  with dressing. Keep clean.   Increase activity slowly   Complete by: As directed       SUBJECTIVE:  Discharge Vitals:   BP 101/71 (BP Location: Right Arm)   Pulse 81   Temp 97.9 F (36.6 C) (Oral)   Resp 17   Ht _0  (1.956 m)   Wt (!) 174 kg   SpO2 100%   BMI 45.48 kg/m   OBJECTIVE:   Pertinent Labs, Studies, and Procedures:  CBC Latest Ref Rng & Units 03/14/2021 03/13/2021 03/12/2021  WBC 4.0 - 10.5 K/uL 9.1 6.6 7.9  Hemoglobin 13.0 - 17.0 g/dL 11.1(L) 10.4(L) 10.9(L)  Hematocrit 39.0 - 52.0 % 37.0(L) 32.9(L) 34.5(L)  Platelets 150 - 400 K/uL 293 268 332    CMP Latest Ref Rng & Units 03/14/2021 03/13/2021 03/12/2021  Glucose 70 - 99 mg/dL 181(H) 146(H) 132(H)  BUN 6 - 20 mg/dL 27(H) 30(H) 33(H)  Creatinine 0.61 - 1.24 mg/dL 1.48(H) 1.33(H) 1.38(H)  Sodium 135 - 145 mmol/L 141 137 138  Potassium 3.5 - 5.1 mmol/L 4.6 4.6 4.8  Chloride 98 - 111 mmol/L 103 104 104  CO2 22 - 32 mmol/L _1 Calcium 8.9 - 10.3 mg/dL 9.1 8.8(L) 9.2  Total Protein 6.5 - 8.1 g/dL - - -  Total Bilirubin 0.3 - 1.2 mg/dL - - -  Alkaline Phos 38 - 126 U/L - - -  AST 15 - 41 U/L - - -  ALT 0 - 44 U/L - - -    DG Tibia/Fibula Left  Result Date: 03/04/2021 CLINICAL DATA:  Lower left leg wound. EXAM: LEFT TIBIA AND FIBULA - 2 VIEW COMPARISON:  February 18, 2021 FINDINGS: There is no evidence of fracture or other focal bone lesions. Moderate severity medial and lateral tibiofemoral compartment space narrowing is seen. A radiopaque vascular stent is seen within the region of the left popliteal fossa. IMPRESSION: 1. Moderate severity degenerative changes of the left knee.  Electronically Signed   By: Virgina Norfolk M.D.   On: 03/04/2021 23:42   MR TIBIA FIBULA LEFT WO CONTRAST  Result Date: 03/05/2021 CLINICAL DATA:  Left lower leg redness and swelling in a diabetic patient. EXAM: MRI OF LOWER LEFT EXTREMITY WITHOUT CONTRAST; MRI OF THE LEFT FOOT WITHOUT CONTRAST TECHNIQUE: Multiplanar, multisequence MR imaging of the left lower leg and foot was performed. No intravenous contrast was administered. COMPARISON:  Plain films left lower leg 03/04/2021. Plain films left foot 08/07/2020. FINDINGS: Bones/Joint/Cartilage There is no marrow edema to suggest osteomyelitis. No fracture, stress change or worrisome lesion is identified. The patient is status post amputation at the level of the mid to distal diaphysis of the left fourth metatarsal. Ligaments Negative. Muscles and Tendons There is moderate fatty atrophy of lower leg musculature bilaterally. Please note that the right lower leg is partially imaged on the coronal series. No intramuscular fluid collection is seen. No strain or tear. Soft tissues Subcutaneous edema in the lower legs bilaterally is worse on the left where it is intense. There is also intense subcutaneous edema about the left foot. IMPRESSION: Left worse than right lower leg subcutaneous edema and subcutaneous edema over the dorsum of the foot could be due to dependent change or cellulitis. Negative for abscess, osteomyelitis, myositis or septic joint. Status post amputation at the level of the mid to distal diaphysis of the fourth metatarsal. Electronically Signed   By: Inge Rise M.D.   On: 03/05/2021 13:49   MR FOOT LEFT WO CONTRAST  Result Date: 03/05/2021 CLINICAL DATA:  Left lower leg redness and swelling in a diabetic patient. EXAM: MRI OF LOWER LEFT EXTREMITY WITHOUT CONTRAST; MRI OF THE LEFT FOOT WITHOUT CONTRAST TECHNIQUE: Multiplanar, multisequence MR imaging of the left lower leg and foot was performed. No intravenous contrast was  administered. COMPARISON:  Plain films left lower leg 03/04/2021. Plain films left foot 08/07/2020. FINDINGS: Bones/Joint/Cartilage There is no marrow edema to suggest osteomyelitis. No fracture, stress change or worrisome lesion is identified. The patient is status post amputation at the level of the mid to distal diaphysis of the left fourth metatarsal. Ligaments Negative. Muscles and Tendons There is moderate fatty atrophy of lower leg musculature bilaterally. Please note that the right lower leg is partially imaged on the coronal series. No intramuscular fluid collection is seen. No strain or tear. Soft tissues Subcutaneous edema in the lower legs bilaterally is worse on the left where it is intense. There is also intense subcutaneous edema about the left foot. IMPRESSION: Left worse than right lower leg subcutaneous edema and subcutaneous edema over the dorsum of the foot could be due to dependent change or cellulitis. Negative for abscess, osteomyelitis, myositis or septic joint. Status post amputation at the level of the mid to distal diaphysis of the fourth metatarsal. Electronically Signed   By: Inge Rise M.D.   On: 03/05/2021 13:49   VAS [place] ABI WITH/WO TBI  Result Date: 03/05/2021 LOWER EXTREMITY DOPPLER STUDY Indications: Ulceration, peripheral artery disease, and Cellulitis.  Limitations: Today's exam was limited due to an open wound. Comparison Study: Prior ABI done 12/29/20 Performing Technologist: Sharion Dove RVS  Examination Guidelines: A complete evaluation includes at minimum, Doppler waveform signals and systolic blood pressure reading at the level of bilateral brachial, anterior tibial, and posterior tibial arteries, when vessel segments are accessible. Bilateral testing is considered an integral part of a complete examination. Photoelectric Plethysmograph (PPG) waveforms and toe systolic pressure readings are included as required and additional duplex testing as needed. Limited  examinations for reoccurring indications may be performed as noted.  ABI Findings: +---------+------------------+-----+-----------+----------+ Right    Rt Pressure (mmHg)IndexWaveform   Comment    +---------+------------------+-----+-----------+----------+ Brachial                        multiphasic           +---------+------------------+-----+-----------+----------+ PTA      131               1.01 multiphasic           +---------+------------------+-----+-----------+----------+ DP       124               0.95 multiphasic           +---------+------------------+-----+-----------+----------+ Great Toe                                  amputation +---------+------------------+-----+-----------+----------+ +---------+------------------+-----+-----------+-------+ Left     Lt Pressure (mmHg)IndexWaveform   Comment +---------+------------------+-----+-----------+-------+ Brachial 130                    multiphasic        +---------+------------------+-----+-----------+-------+ PTA                             multiphasic        +---------+------------------+-----+-----------+-------+ DP  multiphasic        +---------+------------------+-----+-----------+-------+ Great Toe65                0.50                    +---------+------------------+-----+-----------+-------+ +-------+-----------+-----------+------------+------------+ ABI/TBIToday's ABIToday's TBIPrevious ABIPrevious TBI +-------+-----------+-----------+------------+------------+ Right  1.01                  0.81                     +-------+-----------+-----------+------------+------------+ Left              0.5        0.84        0.66         +-------+-----------+-----------+------------+------------+ Right ABIs appear increased. Left TBIs appear decreased.  Summary: Right: Resting right ankle-brachial index is within normal range. No evidence of significant  right lower extremity arterial disease. Left: The left toe-brachial index is abnormal. Unable to obtain pressures secondary to open wound.  *See table(s) above for measurements and observations.  Electronically signed by Servando Snare MD on 03/05/2021 at 5:51:46 PM.    Final    VAS [place] LOWER EXTREMITY VENOUS (DVT)  Result Date: 03/05/2021  Lower Venous DVT Study Indications: Swelling, Pain, Erythema, and Cellulitis.  Comparison Study: Prior negative study done 02/18/21 Performing Technologist: Sharion Dove RVS  Examination Guidelines: A complete evaluation includes B-mode imaging, spectral Doppler, color Doppler, and power Doppler as needed of all accessible portions of each vessel. Bilateral testing is considered an integral part of a complete examination. Limited examinations for reoccurring indications may be performed as noted. The reflux portion of the exam is performed with the patient in reverse Trendelenburg.  +-----+---------------+---------+-----------+----------+--------------+ RIGHTCompressibilityPhasicitySpontaneityPropertiesThrombus Aging +-----+---------------+---------+-----------+----------+--------------+ CFV  Full           Yes      Yes                                 +-----+---------------+---------+-----------+----------+--------------+   +---------+---------------+---------+-----------+----------+--------------+ LEFT     CompressibilityPhasicitySpontaneityPropertiesThrombus Aging +---------+---------------+---------+-----------+----------+--------------+ CFV      Full           Yes      Yes                                 +---------+---------------+---------+-----------+----------+--------------+ SFJ      Full                                                        +---------+---------------+---------+-----------+----------+--------------+ FV Prox  Full                                                         +---------+---------------+---------+-----------+----------+--------------+ FV Mid   Full                                                        +---------+---------------+---------+-----------+----------+--------------+  FV DistalFull                                                        +---------+---------------+---------+-----------+----------+--------------+ PFV      Full                                                        +---------+---------------+---------+-----------+----------+--------------+ POP      Full           Yes      Yes                                 +---------+---------------+---------+-----------+----------+--------------+ PTV      Full                                                        +---------+---------------+---------+-----------+----------+--------------+ PERO     Full                                                        +---------+---------------+---------+-----------+----------+--------------+     Summary: RIGHT: - No evidence of common femoral vein obstruction.  LEFT: - There is no evidence of deep vein thrombosis in the lower extremity.  - Ultrasound characteristics of enlarged lymph nodes noted in the groin.  *See table(s) above for measurements and observations. Electronically signed by Servando Snare MD on 03/05/2021 at 5:53:24 PM.    Final      Signed: Lawerance Cruel, D.O.  Internal Medicine Resident, PGY-2 [facility]  Pager: [phone] 4:35 PM, 03/15/2021

## 2021-03-15 NOTE — Progress Notes (Signed)
Discharge instructions reviewed and given to pt, pt acknowledge understanding. Instructed pt to cover small scab like area with a nonadherent dressing when wearing ace wraps to lle.

## 2021-03-16 ENCOUNTER — Encounter (HOSPITAL_COMMUNITY): Payer: Self-pay | Admitting: Vascular Surgery

## 2021-03-16 ENCOUNTER — Telehealth: Payer: Self-pay

## 2021-03-16 NOTE — Telephone Encounter (Signed)
Called patient to get a 7 day follow up scheduled with Dr. Drue Second, left a voicemail to call us back and get that scheduled

## 2021-03-19 ENCOUNTER — Ambulatory Visit: Payer: Medicare Other | Admitting: Cardiovascular Disease

## 2021-03-19 NOTE — Discharge Summary (Signed)
Name: Joseph [name] MRN: [id number] DOB: 1971/02/05 50 y.o. PCP: [name], NP  Date of Admission: 03/04/2021  8:25 PM Date of Discharge: 03/14/2021  Attending Physician: Dr. Dareen Piano  DISCHARGE DIAGNOSIS:  Principal Problem:   Cellulitis of left leg Active Problems:   Dyslipidemia   CAD -S/P PCI 11/02/19    OSA (obstructive sleep apnea)   Insulin dependent type 2 diabetes mellitus (HCC)   Anxiety disorder   PAD (peripheral artery disease) ([place])   Cellulitis    DISCHARGE MEDICATIONS:   Allergies as of 03/14/2021      Reactions   Sulfa Antibiotics Other (See Comments)   Headaches      Medication List    STOP taking these medications   carvedilol 3.125 MG tablet Commonly known as: COREG   cephALEXin 500 MG capsule Commonly known as: KEFLEX   doxycycline 100 MG tablet Commonly known as: VIBRA-TABS   lisinopril 10 MG tablet Commonly known as: ZESTRIL   sildenafil 20 MG tablet Commonly known as: REVATIO     TAKE these medications   albuterol 108 (90 Base) MCG/ACT inhaler Commonly known as: VENTOLIN HFA Inhale 2 puffs into the lungs every 6 (six) hours as needed for wheezing or shortness of breath.   ALPRAZolam 1 MG tablet Commonly known as: XANAX Take 1 mg by mouth See admin instructions. 1 tablet by mouth twice daily and 2 tabs at bedtime.   aspirin EC 81 MG tablet Take 1 tablet (81 mg total) by mouth daily. Swallow whole. What changed: Another medication with the same name was removed. Continue taking this medication, and follow the directions you see here.   atorvastatin 80 MG tablet Commonly known as: LIPITOR Take 1 tablet (80 mg total) by mouth daily at 6 PM.   blood glucose meter kit and supplies Kit Dispense based on patient and insurance preference. Use up to four times daily as directed. (FOR ICD-9 250.00, 250.01).   buPROPion 150 MG 12 hr tablet Commonly known as: WELLBUTRIN SR Take 150 mg by mouth 2 (two) times daily.    ciprofloxacin 500 MG tablet Commonly known as: Cipro Take 1 tablet (500 mg total) by mouth 2 (two) times daily for 7 days.   clopidogrel 75 MG tablet Commonly known as: PLAVIX Take 1 tablet (75 mg total) by mouth daily with breakfast.   escitalopram 10 MG tablet Commonly known as: LEXAPRO Take 10 mg by mouth in the morning.   fenofibrate 145 MG tablet Commonly known as: TRICOR Take 145 mg by mouth in the morning.   Flovent Diskus 250 MCG/BLIST Aepb Generic drug: Fluticasone Propionate (Inhal) Inhale 2 puffs into the lungs in the morning and at bedtime.   fluconazole 150 MG tablet Commonly known as: DIFLUCAN Take 150 mg by mouth every Monday.   fluticasone 50 MCG/ACT nasal spray Commonly known as: FLONASE Place 2 sprays into both nostrils in the morning.   FreeStyle Libre 14 Day Sensor Misc Inject 1 patch into the skin every 14 (fourteen) days.   furosemide 40 MG tablet Commonly known as: LASIX Take 1 tablet (40 mg total) by mouth daily.   insulin lispro 100 UNIT/ML KwikPen Commonly known as: HumaLOG KwikPen Inject 12 Units into the skin 3 (three) times daily. What changed: how much to take   Lantus SoloStar 100 UNIT/ML Solostar Pen Generic drug: insulin glargine Inject 46 Units into the skin daily before breakfast AND 52 Units at bedtime. What changed: See the new instructions.   meclizine 25 MG  tablet Commonly known as: ANTIVERT Take 25 mg by mouth 3 (three) times daily as needed for dizziness. Take 50 mg by mouth in the morning & 25 mg by mouth at night   metFORMIN 1000 MG tablet Commonly known as: GLUCOPHAGE Take 1 tablet (1,000 mg total) by mouth 2 (two) times daily with a meal.   midodrine 5 MG tablet Commonly known as: PROAMATINE Take 5 mg by mouth 3 (three) times daily with meals.   mupirocin cream 2 % Commonly known as: BACTROBAN Apply topically daily.   naloxone 4 MG/0.1ML Liqd nasal spray kit Commonly known as: NARCAN Place 4 mg into the nose  as needed (overdose).   nitroGLYCERIN 0.4 MG SL tablet Commonly known as: NITROSTAT Place 0.4 mg under the tongue every 5 (five) minutes as needed for chest pain.   oxyCODONE 15 MG immediate release tablet Commonly known as: ROXICODONE Take 1 tablet (15 mg total) by mouth 5 (five) times daily. What changed: Another medication with the same name was added. Make sure you understand how and when to take each.   oxyCODONE 5 MG immediate release tablet Commonly known as: Oxy IR/ROXICODONE Take 1 tablet (5 mg total) by mouth every 6 (six) hours as needed for up to 3 days for severe pain or breakthrough pain. What changed: You were already taking a medication with the same name, and this prescription was added. Make sure you understand how and when to take each.   pantoprazole 40 MG tablet Commonly known as: PROTONIX Take 1 tablet (40 mg total) by mouth daily. What changed:   how much to take  when to take this   pregabalin 150 MG capsule Commonly known as: LYRICA Take 150 mg by mouth 3 (three) times daily.   promethazine 25 MG tablet Commonly known as: PHENERGAN Take 25 mg by mouth every 4 (four) hours as needed for nausea or vomiting.   sulfamethoxazole-trimethoprim 800-160 MG tablet Commonly known as: BACTRIM DS Take 2 tablets by mouth 2 (two) times daily.   Victoza 18 MG/3ML Sopn Generic drug: liraglutide Inject 1.2 mg into the skin at bedtime.            Discharge Care Instructions  (From admission, onward)         Start     Ordered   03/14/21 0000  Discharge wound care:       Comments: Please see above instructions - apply antibacterial every day then cover with dressing. Keep clean.   03/14/21 1744          DISPOSITION AND FOLLOW-UP:  Joseph [name] was discharged from [facility] in Good condition.  At the hospital follow up visit please address:  Follow-up Recommendations:  A. Cellulitis: Recommendations: Make sure patient  finishes 7 day course of cipro and bactrim and follow up with wound care.  Pending work-up: none Labs: CBC, Imaging: none  B. PAD: Recommendations: Continue DAPT. Follow up with Dr. Sharol Given. Pending work-up: none Labs: none, Imaging: none  C. IDDM: Recommendations: Continue tight control of blood glucose for better wound healing. Titrate insulin.  Pending work-up: none Labs: CBG and BMP (for kindey function), Imaging: none  D. HFpEF: Recommendations: reevaluate fluid status and redose lasix accordingly Pending work-up: none Labs: none, Imaging: none   E. CKD: Recommendations: make sure kidney function is stable. Please recheck his electrolytes. Pending work-up: none Labs: Basic Metabolic Profile, MG, Phos Imaging: none   F. B12 def.: Recommendations: oral supplementation of B12 needed Pending  work-up: none Labs: none, Imaging: none  G. Hypotension: Recommendations: continue midodrine Pending work-up: none Labs: none, Imaging: none   Important changes since admission: Discharged with DAPT and 7 day course of cipro and flagyl   Follow-up Appointments:  Follow-up Information    [name], NP. Go in 3 day(s).   Why: P Contact information: [place] [address] [phone]               HOSPITAL COURSE:  Patient Summary:  Purulent LLE Cellulitis in the setting of Multiple Diabetic Foot/Shin Wounds and Moderate PAD of the LLE s/p revascularization of the L SFA and L popliteal arteries 09/2020 Absence of radial pulse in RUE, with small distal lesions in bilateral hands, Concerning for Ischemia Patient presented with left lower extremity cellulitis with erythema that extended to his knee associated warmth.  There was concern for underlying peripheral artery disease.  Vascular surgery was consulted and they performed an aortogram that did not show any reversible ischemic lesions.  Patient was treated with broad-spectrum antibiotics while  in the hospital and transition to p.o. Cipro and Flagyl for an additional 7-day course in the outpatient setting.  Patient was instructed to follow-up with Dr. Sharol Given and wound care in the outpatient setting.  He was also started on dual antiplatelet therapy due to his underlying peripheral artery disease.  Insulin-dependent diabetes, type II Patient has significant type 2 diabetes with extremely high insulin requirements.  Please continue to titrate his insulin and diabetic medications in the outpatient setting to get better control of his hyperglycemia and better wound healing.  He likely has significant microvascular ischemia contributing to his poor wound healing.   HFpEF: This was stable during this hospitalization.  He will need further evaluation and management of his fluid status and redosing of his Lasix in the outpatient setting.  B12 deficiency: Patient was found to have significantly low B12 and was given injection supplementation while in the hospital.  He will need oral supplementation in the outpatient setting.  Please set the patient up with oral B12  DISCHARGE INSTRUCTIONS:   Discharge Instructions    (HEART FAILURE PATIENTS) Call MD:  Anytime you have any of the following symptoms: 1) 3 pound weight gain in 24 hours or 5 pounds in 1 week 2) shortness of breath, with or without a dry hacking cough 3) swelling in the hands, feet or stomach 4) if you have to sleep on extra pillows at night in order to breathe.   Complete by: As directed    Call MD for:  difficulty breathing, headache or visual disturbances   Complete by: As directed    Call MD for:  extreme fatigue   Complete by: As directed    Call MD for:  persistant dizziness or light-headedness   Complete by: As directed    Call MD for:  redness, tenderness, or signs of infection (pain, swelling, redness, odor or green/yellow discharge around incision site)   Complete by: As directed    Call MD for:  severe uncontrolled pain    Complete by: As directed    Call MD for:  temperature >100.4   Complete by: As directed    Diet - low sodium heart healthy   Complete by: As directed    Diet Carb Modified   Complete by: As directed    Discharge wound care:   Complete by: As directed    Please see above instructions - apply antibacterial every day then cover  with dressing. Keep clean.   Increase activity slowly   Complete by: As directed       SUBJECTIVE:  Discharge Vitals:   BP 101/71 (BP Location: Right Arm)   Pulse 81   Temp 97.9 F (36.6 C) (Oral)   Resp 17   Ht _0  (1.956 m)   Wt (!) 174 kg   SpO2 100%   BMI 45.48 kg/m   OBJECTIVE:   Pertinent Labs, Studies, and Procedures:  CBC Latest Ref Rng & Units 03/14/2021 03/13/2021 03/12/2021  WBC 4.0 - 10.5 K/uL 9.1 6.6 7.9  Hemoglobin 13.0 - 17.0 g/dL 11.1(L) 10.4(L) 10.9(L)  Hematocrit 39.0 - 52.0 % 37.0(L) 32.9(L) 34.5(L)  Platelets 150 - 400 K/uL 293 268 332    CMP Latest Ref Rng & Units 03/14/2021 03/13/2021 03/12/2021  Glucose 70 - 99 mg/dL 181(H) 146(H) 132(H)  BUN 6 - 20 mg/dL 27(H) 30(H) 33(H)  Creatinine 0.61 - 1.24 mg/dL 1.48(H) 1.33(H) 1.38(H)  Sodium 135 - 145 mmol/L 141 137 138  Potassium 3.5 - 5.1 mmol/L 4.6 4.6 4.8  Chloride 98 - 111 mmol/L 103 104 104  CO2 22 - 32 mmol/L _1 Calcium 8.9 - 10.3 mg/dL 9.1 8.8(L) 9.2  Total Protein 6.5 - 8.1 g/dL - - -  Total Bilirubin 0.3 - 1.2 mg/dL - - -  Alkaline Phos 38 - 126 U/L - - -  AST 15 - 41 U/L - - -  ALT 0 - 44 U/L - - -    DG Tibia/Fibula Left  Result Date: 03/04/2021 CLINICAL DATA:  Lower left leg wound. EXAM: LEFT TIBIA AND FIBULA - 2 VIEW COMPARISON:  February 18, 2021 FINDINGS: There is no evidence of fracture or other focal bone lesions. Moderate severity medial and lateral tibiofemoral compartment space narrowing is seen. A radiopaque vascular stent is seen within the region of the left popliteal fossa. IMPRESSION: 1. Moderate severity degenerative changes of the left knee.  Electronically Signed   By: Virgina Norfolk M.D.   On: 03/04/2021 23:42   MR TIBIA FIBULA LEFT WO CONTRAST  Result Date: 03/05/2021 CLINICAL DATA:  Left lower leg redness and swelling in a diabetic patient. EXAM: MRI OF LOWER LEFT EXTREMITY WITHOUT CONTRAST; MRI OF THE LEFT FOOT WITHOUT CONTRAST TECHNIQUE: Multiplanar, multisequence MR imaging of the left lower leg and foot was performed. No intravenous contrast was administered. COMPARISON:  Plain films left lower leg 03/04/2021. Plain films left foot 08/07/2020. FINDINGS: Bones/Joint/Cartilage There is no marrow edema to suggest osteomyelitis. No fracture, stress change or worrisome lesion is identified. The patient is status post amputation at the level of the mid to distal diaphysis of the left fourth metatarsal. Ligaments Negative. Muscles and Tendons There is moderate fatty atrophy of lower leg musculature bilaterally. Please note that the right lower leg is partially imaged on the coronal series. No intramuscular fluid collection is seen. No strain or tear. Soft tissues Subcutaneous edema in the lower legs bilaterally is worse on the left where it is intense. There is also intense subcutaneous edema about the left foot. IMPRESSION: Left worse than right lower leg subcutaneous edema and subcutaneous edema over the dorsum of the foot could be due to dependent change or cellulitis. Negative for abscess, osteomyelitis, myositis or septic joint. Status post amputation at the level of the mid to distal diaphysis of the fourth metatarsal. Electronically Signed   By: Inge Rise M.D.   On: 03/05/2021 13:49   MR FOOT LEFT WO CONTRAST  Result Date: 03/05/2021 CLINICAL DATA:  Left lower leg redness and swelling in a diabetic patient. EXAM: MRI OF LOWER LEFT EXTREMITY WITHOUT CONTRAST; MRI OF THE LEFT FOOT WITHOUT CONTRAST TECHNIQUE: Multiplanar, multisequence MR imaging of the left lower leg and foot was performed. No intravenous contrast was  administered. COMPARISON:  Plain films left lower leg 03/04/2021. Plain films left foot 08/07/2020. FINDINGS: Bones/Joint/Cartilage There is no marrow edema to suggest osteomyelitis. No fracture, stress change or worrisome lesion is identified. The patient is status post amputation at the level of the mid to distal diaphysis of the left fourth metatarsal. Ligaments Negative. Muscles and Tendons There is moderate fatty atrophy of lower leg musculature bilaterally. Please note that the right lower leg is partially imaged on the coronal series. No intramuscular fluid collection is seen. No strain or tear. Soft tissues Subcutaneous edema in the lower legs bilaterally is worse on the left where it is intense. There is also intense subcutaneous edema about the left foot. IMPRESSION: Left worse than right lower leg subcutaneous edema and subcutaneous edema over the dorsum of the foot could be due to dependent change or cellulitis. Negative for abscess, osteomyelitis, myositis or septic joint. Status post amputation at the level of the mid to distal diaphysis of the fourth metatarsal. Electronically Signed   By: Inge Rise M.D.   On: 03/05/2021 13:49   VAS [place] ABI WITH/WO TBI  Result Date: 03/05/2021 LOWER EXTREMITY DOPPLER STUDY Indications: Ulceration, peripheral artery disease, and Cellulitis.  Limitations: Today's exam was limited due to an open wound. Comparison Study: Prior ABI done 12/29/20 Performing Technologist: Sharion Dove RVS  Examination Guidelines: A complete evaluation includes at minimum, Doppler waveform signals and systolic blood pressure reading at the level of bilateral brachial, anterior tibial, and posterior tibial arteries, when vessel segments are accessible. Bilateral testing is considered an integral part of a complete examination. Photoelectric Plethysmograph (PPG) waveforms and toe systolic pressure readings are included as required and additional duplex testing as needed. Limited  examinations for reoccurring indications may be performed as noted.  ABI Findings: +---------+------------------+-----+-----------+----------+ Right    Rt Pressure (mmHg)IndexWaveform   Comment    +---------+------------------+-----+-----------+----------+ Brachial                        multiphasic           +---------+------------------+-----+-----------+----------+ PTA      131               1.01 multiphasic           +---------+------------------+-----+-----------+----------+ DP       124               0.95 multiphasic           +---------+------------------+-----+-----------+----------+ Great Toe                                  amputation +---------+------------------+-----+-----------+----------+ +---------+------------------+-----+-----------+-------+ Left     Lt Pressure (mmHg)IndexWaveform   Comment +---------+------------------+-----+-----------+-------+ Brachial 130                    multiphasic        +---------+------------------+-----+-----------+-------+ PTA                             multiphasic        +---------+------------------+-----+-----------+-------+ DP  multiphasic        +---------+------------------+-----+-----------+-------+ Great Toe65                0.50                    +---------+------------------+-----+-----------+-------+ +-------+-----------+-----------+------------+------------+ ABI/TBIToday's ABIToday's TBIPrevious ABIPrevious TBI +-------+-----------+-----------+------------+------------+ Right  1.01                  0.81                     +-------+-----------+-----------+------------+------------+ Left              0.5        0.84        0.66         +-------+-----------+-----------+------------+------------+ Right ABIs appear increased. Left TBIs appear decreased.  Summary: Right: Resting right ankle-brachial index is within normal range. No evidence of significant  right lower extremity arterial disease. Left: The left toe-brachial index is abnormal. Unable to obtain pressures secondary to open wound.  *See table(s) above for measurements and observations.  Electronically signed by Servando Snare MD on 03/05/2021 at 5:51:46 PM.    Final    VAS [place] LOWER EXTREMITY VENOUS (DVT)  Result Date: 03/05/2021  Lower Venous DVT Study Indications: Swelling, Pain, Erythema, and Cellulitis.  Comparison Study: Prior negative study done 02/18/21 Performing Technologist: Sharion Dove RVS  Examination Guidelines: A complete evaluation includes B-mode imaging, spectral Doppler, color Doppler, and power Doppler as needed of all accessible portions of each vessel. Bilateral testing is considered an integral part of a complete examination. Limited examinations for reoccurring indications may be performed as noted. The reflux portion of the exam is performed with the patient in reverse Trendelenburg.  +-----+---------------+---------+-----------+----------+--------------+ RIGHTCompressibilityPhasicitySpontaneityPropertiesThrombus Aging +-----+---------------+---------+-----------+----------+--------------+ CFV  Full           Yes      Yes                                 +-----+---------------+---------+-----------+----------+--------------+   +---------+---------------+---------+-----------+----------+--------------+ LEFT     CompressibilityPhasicitySpontaneityPropertiesThrombus Aging +---------+---------------+---------+-----------+----------+--------------+ CFV      Full           Yes      Yes                                 +---------+---------------+---------+-----------+----------+--------------+ SFJ      Full                                                        +---------+---------------+---------+-----------+----------+--------------+ FV Prox  Full                                                         +---------+---------------+---------+-----------+----------+--------------+ FV Mid   Full                                                        +---------+---------------+---------+-----------+----------+--------------+  FV DistalFull                                                        +---------+---------------+---------+-----------+----------+--------------+ PFV      Full                                                        +---------+---------------+---------+-----------+----------+--------------+ POP      Full           Yes      Yes                                 +---------+---------------+---------+-----------+----------+--------------+ PTV      Full                                                        +---------+---------------+---------+-----------+----------+--------------+ PERO     Full                                                        +---------+---------------+---------+-----------+----------+--------------+     Summary: RIGHT: - No evidence of common femoral vein obstruction.  LEFT: - There is no evidence of deep vein thrombosis in the lower extremity.  - Ultrasound characteristics of enlarged lymph nodes noted in the groin.  *See table(s) above for measurements and observations. Electronically signed by Servando Snare MD on 03/05/2021 at 5:53:24 PM.    Final      Signed: Jeralyn Bennett, MD  Internal Medicine Resident, PGY-1 [facility]  Pager: [phone] 4:35 PM, 03/15/2021

## 2021-03-20 ENCOUNTER — Telehealth: Payer: Self-pay

## 2021-03-20 NOTE — Telephone Encounter (Signed)
Left patient a voice mail to call back to schedule a 7 day follow with Dr. Drue Second around 03/24/21

## 2021-03-20 NOTE — Telephone Encounter (Signed)
error 

## 2021-03-24 ENCOUNTER — Ambulatory Visit: Payer: Medicare HMO | Admitting: Orthopedic Surgery

## 2021-04-11 ENCOUNTER — Encounter (HOSPITAL_COMMUNITY): Payer: Self-pay | Admitting: Emergency Medicine

## 2021-04-11 ENCOUNTER — Inpatient Hospital Stay (HOSPITAL_COMMUNITY)
Admission: EM | Admit: 2021-04-11 | Discharge: 2021-04-14 | DRG: 872 | Disposition: A | Discharge status: Home Health | Payer: Medicare HMO | Attending: Internal Medicine | Admitting: Internal Medicine

## 2021-04-11 ENCOUNTER — Other Ambulatory Visit: Payer: Self-pay

## 2021-04-11 ENCOUNTER — Emergency Department (HOSPITAL_COMMUNITY): Payer: Medicare HMO

## 2021-04-11 DIAGNOSIS — M549 Dorsalgia, unspecified: Secondary | ICD-10-CM | POA: Diagnosis present

## 2021-04-11 DIAGNOSIS — Z20822 Contact with and (suspected) exposure to covid-19: Secondary | ICD-10-CM | POA: Diagnosis present

## 2021-04-11 DIAGNOSIS — Z6841 Body Mass Index (BMI) 40.0 and over, adult: Secondary | ICD-10-CM | POA: Diagnosis not present

## 2021-04-11 DIAGNOSIS — I2510 Atherosclerotic heart disease of native coronary artery without angina pectoris: Secondary | ICD-10-CM | POA: Diagnosis present

## 2021-04-11 DIAGNOSIS — Z955 Presence of coronary angioplasty implant and graft: Secondary | ICD-10-CM

## 2021-04-11 DIAGNOSIS — F419 Anxiety disorder, unspecified: Secondary | ICD-10-CM | POA: Diagnosis present

## 2021-04-11 DIAGNOSIS — Z882 Allergy status to sulfonamides status: Secondary | ICD-10-CM

## 2021-04-11 DIAGNOSIS — L03116 Cellulitis of left lower limb: Secondary | ICD-10-CM | POA: Diagnosis present

## 2021-04-11 DIAGNOSIS — G4733 Obstructive sleep apnea (adult) (pediatric): Secondary | ICD-10-CM | POA: Diagnosis present

## 2021-04-11 DIAGNOSIS — E1122 Type 2 diabetes mellitus with diabetic chronic kidney disease: Secondary | ICD-10-CM | POA: Diagnosis present

## 2021-04-11 DIAGNOSIS — Z7984 Long term (current) use of oral hypoglycemic drugs: Secondary | ICD-10-CM

## 2021-04-11 DIAGNOSIS — Z951 Presence of aortocoronary bypass graft: Secondary | ICD-10-CM

## 2021-04-11 DIAGNOSIS — M160 Bilateral primary osteoarthritis of hip: Secondary | ICD-10-CM | POA: Diagnosis present

## 2021-04-11 DIAGNOSIS — Z794 Long term (current) use of insulin: Secondary | ICD-10-CM

## 2021-04-11 DIAGNOSIS — E11628 Type 2 diabetes mellitus with other skin complications: Secondary | ICD-10-CM

## 2021-04-11 DIAGNOSIS — E1151 Type 2 diabetes mellitus with diabetic peripheral angiopathy without gangrene: Secondary | ICD-10-CM | POA: Diagnosis present

## 2021-04-11 DIAGNOSIS — F32A Depression, unspecified: Secondary | ICD-10-CM | POA: Diagnosis present

## 2021-04-11 DIAGNOSIS — K219 Gastro-esophageal reflux disease without esophagitis: Secondary | ICD-10-CM | POA: Diagnosis present

## 2021-04-11 DIAGNOSIS — Z87891 Personal history of nicotine dependence: Secondary | ICD-10-CM

## 2021-04-11 DIAGNOSIS — N182 Chronic kidney disease, stage 2 (mild): Secondary | ICD-10-CM | POA: Diagnosis present

## 2021-04-11 DIAGNOSIS — I252 Old myocardial infarction: Secondary | ICD-10-CM | POA: Diagnosis not present

## 2021-04-11 DIAGNOSIS — Z86718 Personal history of other venous thrombosis and embolism: Secondary | ICD-10-CM

## 2021-04-11 DIAGNOSIS — M19011 Primary osteoarthritis, right shoulder: Secondary | ICD-10-CM | POA: Diagnosis present

## 2021-04-11 DIAGNOSIS — Z833 Family history of diabetes mellitus: Secondary | ICD-10-CM

## 2021-04-11 DIAGNOSIS — I248 Other forms of acute ischemic heart disease: Secondary | ICD-10-CM | POA: Diagnosis present

## 2021-04-11 DIAGNOSIS — Z7901 Long term (current) use of anticoagulants: Secondary | ICD-10-CM

## 2021-04-11 DIAGNOSIS — A419 Sepsis, unspecified organism: Secondary | ICD-10-CM | POA: Diagnosis present

## 2021-04-11 DIAGNOSIS — R6520 Severe sepsis without septic shock: Secondary | ICD-10-CM | POA: Diagnosis present

## 2021-04-11 DIAGNOSIS — J449 Chronic obstructive pulmonary disease, unspecified: Secondary | ICD-10-CM | POA: Diagnosis present

## 2021-04-11 DIAGNOSIS — Z7982 Long term (current) use of aspirin: Secondary | ICD-10-CM

## 2021-04-11 DIAGNOSIS — M170 Bilateral primary osteoarthritis of knee: Secondary | ICD-10-CM | POA: Diagnosis present

## 2021-04-11 DIAGNOSIS — I214 Non-ST elevation (NSTEMI) myocardial infarction: Secondary | ICD-10-CM | POA: Diagnosis present

## 2021-04-11 DIAGNOSIS — I129 Hypertensive chronic kidney disease with stage 1 through stage 4 chronic kidney disease, or unspecified chronic kidney disease: Secondary | ICD-10-CM | POA: Diagnosis present

## 2021-04-11 DIAGNOSIS — G8929 Other chronic pain: Secondary | ICD-10-CM | POA: Diagnosis present

## 2021-04-11 DIAGNOSIS — Z8249 Family history of ischemic heart disease and other diseases of the circulatory system: Secondary | ICD-10-CM

## 2021-04-11 DIAGNOSIS — Z86711 Personal history of pulmonary embolism: Secondary | ICD-10-CM | POA: Diagnosis present

## 2021-04-11 DIAGNOSIS — E1165 Type 2 diabetes mellitus with hyperglycemia: Secondary | ICD-10-CM | POA: Diagnosis present

## 2021-04-11 DIAGNOSIS — E785 Hyperlipidemia, unspecified: Secondary | ICD-10-CM | POA: Diagnosis present

## 2021-04-11 DIAGNOSIS — R7989 Other specified abnormal findings of blood chemistry: Secondary | ICD-10-CM | POA: Diagnosis present

## 2021-04-11 DIAGNOSIS — I70202 Unspecified atherosclerosis of native arteries of extremities, left leg: Secondary | ICD-10-CM | POA: Diagnosis present

## 2021-04-11 DIAGNOSIS — R079 Chest pain, unspecified: Secondary | ICD-10-CM

## 2021-04-11 DIAGNOSIS — Z7902 Long term (current) use of antithrombotics/antiplatelets: Secondary | ICD-10-CM

## 2021-04-11 DIAGNOSIS — Z79891 Long term (current) use of opiate analgesic: Secondary | ICD-10-CM

## 2021-04-11 DIAGNOSIS — Z79899 Other long term (current) drug therapy: Secondary | ICD-10-CM

## 2021-04-11 DIAGNOSIS — R778 Other specified abnormalities of plasma proteins: Secondary | ICD-10-CM | POA: Diagnosis present

## 2021-04-11 DIAGNOSIS — M19012 Primary osteoarthritis, left shoulder: Secondary | ICD-10-CM | POA: Diagnosis present

## 2021-04-11 DIAGNOSIS — I739 Peripheral vascular disease, unspecified: Secondary | ICD-10-CM | POA: Diagnosis present

## 2021-04-11 DIAGNOSIS — N179 Acute kidney failure, unspecified: Secondary | ICD-10-CM | POA: Diagnosis present

## 2021-04-11 DIAGNOSIS — Z89411 Acquired absence of right great toe: Secondary | ICD-10-CM

## 2021-04-11 LAB — CBC
HCT: 39 % (ref 39.0–52.0)
Hemoglobin: 12 g/dL — ABNORMAL LOW (ref 13.0–17.0)
MCH: 25.7 pg — ABNORMAL LOW (ref 26.0–34.0)
MCHC: 30.8 g/dL (ref 30.0–36.0)
MCV: 83.5 fL (ref 80.0–100.0)
Platelets: 223 10*3/uL (ref 150–400)
RBC: 4.67 MIL/uL (ref 4.22–5.81)
RDW: 15.6 % — ABNORMAL HIGH (ref 11.5–15.5)
WBC: 12.4 10*3/uL — ABNORMAL HIGH (ref 4.0–10.5)
nRBC: 0 % (ref 0.0–0.2)

## 2021-04-11 LAB — BASIC METABOLIC PANEL
Anion gap: 11 (ref 5–15)
BUN: 23 mg/dL — ABNORMAL HIGH (ref 6–20)
CO2: 22 mmol/L (ref 22–32)
Calcium: 9.2 mg/dL (ref 8.9–10.3)
Chloride: 100 mmol/L (ref 98–111)
Creatinine, Ser: 1.7 mg/dL — ABNORMAL HIGH (ref 0.61–1.24)
GFR, Estimated: 49 mL/min — ABNORMAL LOW (ref >60)
Glucose, Bld: 396 mg/dL — ABNORMAL HIGH (ref 70–99)
Potassium: 4.4 mmol/L (ref 3.5–5.1)
Sodium: 133 mmol/L — ABNORMAL LOW (ref 135–145)

## 2021-04-11 LAB — LACTIC ACID, PLASMA
Lactic Acid, Venous: 3.7 mmol/L (ref 0.5–1.9)
Lactic Acid, Venous: 2.4 mmol/L (ref 0.5–1.9)

## 2021-04-11 LAB — HEPATIC FUNCTION PANEL
ALT: 33 U/L (ref 0–44)
AST: 21 U/L (ref 15–41)
Albumin: 3.2 g/dL — ABNORMAL LOW (ref 3.5–5.0)
Alkaline Phosphatase: 85 U/L (ref 38–126)
Bilirubin, Direct: 0.1 mg/dL (ref 0.0–0.2)
Indirect Bilirubin: 0.5 mg/dL (ref 0.3–0.9)
Total Bilirubin: 0.6 mg/dL (ref 0.3–1.2)
Total Protein: 6.8 g/dL (ref 6.5–8.1)

## 2021-04-11 LAB — CBG MONITORING, ED: Glucose-Capillary: 306 mg/dL — ABNORMAL HIGH (ref 70–99)

## 2021-04-11 LAB — GLUCOSE, CAPILLARY: Glucose-Capillary: 265 mg/dL — ABNORMAL HIGH (ref 70–99)

## 2021-04-11 LAB — TROPONIN I (HIGH SENSITIVITY)
Troponin I (High Sensitivity): 25 ng/L — ABNORMAL HIGH (ref <18)
Troponin I (High Sensitivity): 24 ng/L — ABNORMAL HIGH (ref <18)

## 2021-04-11 MED ORDER — VANCOMYCIN HCL IN DEXTROSE 1-5 GM/200ML-% IV SOLN: 1000.0000 mg | Freq: Once | INTRAVENOUS | Status: DC

## 2021-04-11 MED ORDER — ESCITALOPRAM OXALATE 10 MG PO TABS
10.0000 mg | ORAL_TABLET | Freq: Every morning | ORAL | Status: DC
  Administered 2021-04-12 – 2021-04-14 (×3): 10 mg via ORAL
  Filled 2021-04-11 (×3): qty 1

## 2021-04-11 MED ORDER — LACTATED RINGERS IV BOLUS
1000.0000 mL | Freq: Once | INTRAVENOUS | Status: AC
  Administered 2021-04-11: 1000 mL via INTRAVENOUS

## 2021-04-11 MED ORDER — SODIUM CHLORIDE 0.9 % IV BOLUS: 500.0000 mL | Freq: Once | INTRAVENOUS | Status: DC

## 2021-04-11 MED ORDER — ENOXAPARIN SODIUM 40 MG/0.4ML ~~LOC~~ SOLN
40.0000 mg | SUBCUTANEOUS | Status: DC
  Administered 2021-04-11 – 2021-04-12 (×2): 40 mg via SUBCUTANEOUS
  Filled 2021-04-11 (×2): qty 0.4

## 2021-04-11 MED ORDER — LIRAGLUTIDE 18 MG/3ML ~~LOC~~ SOPN: 1.2000 mg | PEN_INJECTOR | Freq: Every day | SUBCUTANEOUS | Status: DC

## 2021-04-11 MED ORDER — CLOPIDOGREL BISULFATE 75 MG PO TABS
75.0000 mg | ORAL_TABLET | Freq: Every day | ORAL | Status: DC
  Administered 2021-04-12 – 2021-04-14 (×3): 75 mg via ORAL
  Filled 2021-04-11 (×3): qty 1

## 2021-04-11 MED ORDER — PROMETHAZINE HCL 12.5 MG PO TABS: 25.0000 mg | ORAL_TABLET | ORAL | Status: DC | PRN

## 2021-04-11 MED ORDER — INSULIN GLARGINE 100 UNIT/ML ~~LOC~~ SOLN
46.0000 [IU] | Freq: Every day | SUBCUTANEOUS | Status: DC
  Filled 2021-04-11 (×2): qty 0.46

## 2021-04-11 MED ORDER — MUPIROCIN CALCIUM 2 % EX CREA
TOPICAL_CREAM | Freq: Every day | CUTANEOUS | Status: DC
  Administered 2021-04-14: 1 via TOPICAL
  Filled 2021-04-11: qty 15

## 2021-04-11 MED ORDER — PANTOPRAZOLE SODIUM 40 MG PO TBEC
80.0000 mg | DELAYED_RELEASE_TABLET | Freq: Every day | ORAL | Status: DC
  Administered 2021-04-12 – 2021-04-14 (×3): 80 mg via ORAL
  Filled 2021-04-11 (×3): qty 2

## 2021-04-11 MED ORDER — FLUTICASONE PROPIONATE (INHAL) 250 MCG/BLIST IN AEPB: 2.0000 | INHALATION_SPRAY | Freq: Two times a day (BID) | RESPIRATORY_TRACT | Status: DC

## 2021-04-11 MED ORDER — OXYCODONE HCL 5 MG PO TABS
15.0000 mg | ORAL_TABLET | Freq: Every day | ORAL | Status: DC
  Administered 2021-04-11 – 2021-04-14 (×14): 15 mg via ORAL
  Filled 2021-04-11 (×14): qty 3

## 2021-04-11 MED ORDER — ALBUTEROL SULFATE HFA 108 (90 BASE) MCG/ACT IN AERS: 2.0000 | INHALATION_SPRAY | Freq: Four times a day (QID) | RESPIRATORY_TRACT | Status: DC | PRN

## 2021-04-11 MED ORDER — BUDESONIDE 0.25 MG/2ML IN SUSP
0.2500 mg | Freq: Two times a day (BID) | RESPIRATORY_TRACT | Status: DC
  Administered 2021-04-12 – 2021-04-14 (×5): 0.25 mg via RESPIRATORY_TRACT
  Filled 2021-04-11 (×5): qty 2

## 2021-04-11 MED ORDER — INSULIN ASPART 100 UNIT/ML ~~LOC~~ SOLN: 12.0000 [IU] | Freq: Three times a day (TID) | SUBCUTANEOUS | Status: DC

## 2021-04-11 MED ORDER — SODIUM CHLORIDE 0.9 % IV BOLUS
1000.0000 mL | Freq: Once | INTRAVENOUS | Status: AC
  Administered 2021-04-11: 1000 mL via INTRAVENOUS

## 2021-04-11 MED ORDER — VANCOMYCIN HCL IN DEXTROSE 1-5 GM/200ML-% IV SOLN
1000.0000 mg | Freq: Three times a day (TID) | INTRAVENOUS | Status: DC
  Administered 2021-04-12 – 2021-04-13 (×5): 1000 mg via INTRAVENOUS
  Filled 2021-04-11 (×5): qty 200

## 2021-04-11 MED ORDER — SODIUM CHLORIDE 0.9 % IV SOLN
2.0000 g | Freq: Three times a day (TID) | INTRAVENOUS | Status: DC
  Administered 2021-04-11 – 2021-04-14 (×8): 2 g via INTRAVENOUS
  Filled 2021-04-11 (×8): qty 2

## 2021-04-11 MED ORDER — BUPROPION HCL ER (SR) 150 MG PO TB12
150.0000 mg | ORAL_TABLET | Freq: Two times a day (BID) | ORAL | Status: DC
  Administered 2021-04-11 – 2021-04-14 (×6): 150 mg via ORAL
  Filled 2021-04-11 (×6): qty 1

## 2021-04-11 MED ORDER — FENOFIBRATE 160 MG PO TABS
160.0000 mg | ORAL_TABLET | Freq: Every day | ORAL | Status: DC
  Administered 2021-04-12 – 2021-04-14 (×3): 160 mg via ORAL
  Filled 2021-04-11 (×3): qty 1

## 2021-04-11 MED ORDER — MIDODRINE HCL 5 MG PO TABS
5.0000 mg | ORAL_TABLET | Freq: Three times a day (TID) | ORAL | Status: DC
  Administered 2021-04-12 – 2021-04-14 (×8): 5 mg via ORAL
  Filled 2021-04-11 (×9): qty 1

## 2021-04-11 MED ORDER — ALPRAZOLAM 1 MG PO TABS
2.0000 mg | ORAL_TABLET | Freq: Every day | ORAL | Status: DC
  Administered 2021-04-11 – 2021-04-13 (×2): 2 mg via ORAL
  Filled 2021-04-11 (×3): qty 2

## 2021-04-11 MED ORDER — INSULIN GLARGINE 100 UNIT/ML ~~LOC~~ SOLN
52.0000 [IU] | Freq: Every day | SUBCUTANEOUS | Status: DC
  Administered 2021-04-11: 52 [IU] via SUBCUTANEOUS
  Filled 2021-04-11 (×2): qty 0.52

## 2021-04-11 MED ORDER — INSULIN ASPART 100 UNIT/ML ~~LOC~~ SOLN
0.0000 [IU] | Freq: Every day | SUBCUTANEOUS | Status: DC
  Administered 2021-04-11: 3 [IU] via SUBCUTANEOUS

## 2021-04-11 MED ORDER — ALPRAZOLAM 1 MG PO TABS
1.0000 mg | ORAL_TABLET | Freq: Two times a day (BID) | ORAL | Status: DC
  Administered 2021-04-12 – 2021-04-14 (×5): 1 mg via ORAL
  Filled 2021-04-11 (×5): qty 1

## 2021-04-11 MED ORDER — MECLIZINE HCL 12.5 MG PO TABS: 25.0000 mg | ORAL_TABLET | Freq: Three times a day (TID) | ORAL | Status: DC | PRN

## 2021-04-11 MED ORDER — ATORVASTATIN CALCIUM 40 MG PO TABS
80.0000 mg | ORAL_TABLET | Freq: Every day | ORAL | Status: DC
  Administered 2021-04-12: 80 mg via ORAL
  Filled 2021-04-11: qty 2

## 2021-04-11 MED ORDER — POLYETHYLENE GLYCOL 3350 17 G PO PACK: 17.0000 g | PACK | Freq: Every day | ORAL | Status: DC | PRN

## 2021-04-11 MED ORDER — PIPERACILLIN-TAZOBACTAM 3.375 G IVPB 30 MIN
3.3750 g | Freq: Once | INTRAVENOUS | Status: AC
  Administered 2021-04-11: 3.375 g via INTRAVENOUS
  Filled 2021-04-11: qty 50

## 2021-04-11 MED ORDER — BISACODYL 5 MG PO TBEC: 5.0000 mg | DELAYED_RELEASE_TABLET | Freq: Every day | ORAL | Status: DC | PRN

## 2021-04-11 MED ORDER — VANCOMYCIN HCL IN DEXTROSE 1-5 GM/200ML-% IV SOLN
1000.0000 mg | Freq: Once | INTRAVENOUS | Status: AC
  Administered 2021-04-11: 1000 mg via INTRAVENOUS
  Filled 2021-04-11: qty 200

## 2021-04-11 MED ORDER — FLUTICASONE PROPIONATE 50 MCG/ACT NA SUSP
2.0000 | Freq: Every morning | NASAL | Status: DC
  Administered 2021-04-12 – 2021-04-14 (×3): 2 via NASAL
  Filled 2021-04-11: qty 16

## 2021-04-11 MED ORDER — INSULIN LISPRO (1 UNIT DIAL) 100 UNIT/ML (KWIKPEN): 12.0000 [IU] | PEN_INJECTOR | Freq: Three times a day (TID) | SUBCUTANEOUS | Status: DC

## 2021-04-11 MED ORDER — ASPIRIN EC 81 MG PO TBEC
81.0000 mg | DELAYED_RELEASE_TABLET | Freq: Every day | ORAL | Status: DC
  Administered 2021-04-12 – 2021-04-14 (×3): 81 mg via ORAL
  Filled 2021-04-11 (×3): qty 1

## 2021-04-11 MED ORDER — SODIUM CHLORIDE 0.9 % IV SOLN: 2.0000 g | Freq: Once | INTRAVENOUS | Status: DC

## 2021-04-11 MED ORDER — INSULIN ASPART 100 UNIT/ML ~~LOC~~ SOLN
0.0000 [IU] | Freq: Three times a day (TID) | SUBCUTANEOUS | Status: DC
  Administered 2021-04-12 – 2021-04-13 (×3): 11 [IU] via SUBCUTANEOUS
  Administered 2021-04-13: 4 [IU] via SUBCUTANEOUS
  Administered 2021-04-13 – 2021-04-14 (×2): 11 [IU] via SUBCUTANEOUS
  Administered 2021-04-14: 7 [IU] via SUBCUTANEOUS

## 2021-04-11 MED ORDER — PREGABALIN 75 MG PO CAPS
150.0000 mg | ORAL_CAPSULE | Freq: Three times a day (TID) | ORAL | Status: DC
  Administered 2021-04-11 – 2021-04-14 (×9): 150 mg via ORAL
  Filled 2021-04-11 (×9): qty 2

## 2021-04-11 MED ORDER — INSULIN ASPART 100 UNIT/ML ~~LOC~~ SOLN
8.0000 [IU] | Freq: Once | SUBCUTANEOUS | Status: AC
  Administered 2021-04-11: 8 [IU] via INTRAVENOUS
  Filled 2021-04-11: qty 1

## 2021-04-11 NOTE — ED Notes (Signed)
Date and time results received: 04/11/21 @2034  (use smartphrase ".now" to insert current time)  Test: Lactic Acid Critical Value: 2.4 Name of Provider Notified: Dr. Orders Received? Expected Value  Or Actions Taken?: None taken

## 2021-04-11 NOTE — ED Triage Notes (Signed)
Pt c/o of cp since 0200 with worsening pain with inhalation. CBG 449. 324 of [facility] via EMS

## 2021-04-11 NOTE — ED Provider Notes (Signed)
[facility] Provider Note   CSN: [id number] Arrival date & time: 04/11/21  1651     History Chief Complaint  Patient presents with  . Chest Pain    Joseph [name] is a 50 y.o. male.  Pt presents to the ED today with cp and left leg redness.  Pt was admitted from 3/16-26 with LLE redness.  While there, he had an aortogram which did not show any reversible ischemic lesions.  He was d/c with cipro and flagyl.  Pt was supposed to f/u with Dr. Sharol Given.  Pt said he has not been checking his blood sugar.  Pt said his cellulitis got better, but it came back.  Pt woke up this am around 0200 with cp.  Pt said it feels like his chest is getting sawed open.         Past Medical History:  Diagnosis Date  . Anxiety   . Arthritis    "knees, shoulders, hips, ankles" (07/29/2016)  . Asthma   . CAD (coronary artery disease)    a. 2017: s/p BMS to distal Cx; b. LHC 05/30/2019: 80% mid, distal RCA s/p DES, 30% narrowing of d LM, widely patent LAD w/ luminal irregularities, widely patent stent in [place]  w/ 90+% stenosis distal to stent beofre small trifurcating obtuse marginal (potentially area of restenosis)  . Chronic bronchitis ([place])   . Chronic ulcer of right great toe ([place]) 09/19/2017  . COPD (chronic obstructive pulmonary disease) ([place])   . Depression   . DVT (deep venous thrombosis) ([place])   . GERD (gastroesophageal reflux disease)   . Hyperlipidemia   . Hypertension   . Myocardial infarction ([place]) 1996   "light one"  . PE (pulmonary embolism) 04/2013   On chronic Xarelto  . Peripheral nerve disease   . Pneumonia "several times"  . Sleep apnea   . Type II diabetes mellitus [facility])     Patient Active Problem List   Diagnosis Date Noted  . Cellulitis of left leg 03/05/2021  . Cellulitis 02/18/2021  . PAD (peripheral artery disease) ([place]) 10/10/2020  . Osteomyelitis of fourth toe of right foot ([place])   . S/P CABG x 4 07/24/2020  . Non-ST elevation (NSTEMI)  myocardial infarction ([place]) 07/22/2020  . Acute on chronic diastolic heart failure ([place]) 07/22/2020  . Hypotension 07/18/2020  . Left leg swelling 07/18/2020  . Elevated troponin 07/18/2020  . AKI (acute kidney injury) ([place]) 07/18/2020  . Elevated brain natriuretic peptide (BNP) level 07/18/2020  . GERD (gastroesophageal reflux disease) 07/18/2020  . Severe Sepsis secondary to Lt leg/Foot Cellulitis 07/18/2020  . Severe sepsis --Severe Sepsis secondary to Lt leg/Foot Cellulitis with hypotension and AKI 07/18/2020  . Unstable angina ([place]) 11/01/2019  . Edema 06/13/2019  . Leukocytosis 05/31/2019  . Smoker 03/08/2019  . Chronic pain 03/08/2019  . Chronic back pain 02/16/2019  . Deep venous thrombosis ([place]) 02/16/2019  . Obesity, Class III, BMI 40-49.9 (morbid obesity) ([place]) 02/16/2019  . Peripheral nerve disease 02/16/2019  . COPD (chronic obstructive pulmonary disease) ([place]) 01/22/2019  . Anxiety disorder 03/26/2018  . Open wound of toe 10/12/2017  . Sinusitis 10/12/2017  . Abdominal pain   . Loss of weight   . Diabetic foot infection ([place]) 09/16/2017  . Uncontrolled type 2 diabetes mellitus with hyperglycemia, with long-term current use of insulin ([place]) 09/16/2017  . Cellulitis of right foot   . Chronic ulcer of great toe of right foot ([place]) 09/09/2017  . Recurrent chest pain 07/29/2016  .  Hyperglycemia   . Insulin dependent type 2 diabetes mellitus ([place]) 04/29/2015  . OSA (obstructive sleep apnea) 05/08/2013  . History of pulmonary embolus (PE) 04/27/2013  . CAD -S/P PCI 11/02/19  09/10/2008  . Dyslipidemia 09/09/2008    Past Surgical History:  Procedure Laterality Date  . ABDOMINAL AORTOGRAM W/LOWER EXTREMITY N/A 10/10/2020   Procedure: ABDOMINAL AORTOGRAM W/LOWER EXTREMITY;  Surgeon: Elam Dutch, MD;  Location: [facility];  Service: Cardiovascular;  Laterality: N/A;  . AMPUTATION Right 09/21/2017   Procedure: RIGHT GREAT TOE AMPUTATION, POSSIBLE VAC;   Surgeon: Leandrew Koyanagi, MD;  Location: [facility];  Service: Orthopedics;  Laterality: Right;  . AMPUTATION Left 08/13/2020   Procedure: LEFT FOOT 4TH RAY AMPUTATION;  Surgeon: Newt Minion, MD;  Location: [facility];  Service: Orthopedics;  Laterality: Left;  . AORTOGRAM Bilateral 03/13/2021   Procedure: ABDOMINAL AORTOGRAM WITH Left LOWER EXTREMITY RUNOFF;  Surgeon: Cherre Robins, MD;  Location: [facility];  Service: Vascular;  Laterality: Bilateral;  . CARDIAC CATHETERIZATION  2006  . CARDIAC CATHETERIZATION  1996   "@ [facility]; when I had my heart attack"  . CARDIAC CATHETERIZATION N/A 07/29/2016   Procedure: Left Heart Cath and Coronary Angiography;  Surgeon: Lorretta Harp, MD;  Location: [facility];  Service: Cardiovascular;  Laterality: N/A;  . CARDIAC CATHETERIZATION N/A 07/29/2016   Procedure: Coronary Stent Intervention;  Surgeon: Lorretta Harp, MD;  Location: [facility];  Service: Cardiovascular;  Laterality: N/A;  . CARPAL TUNNEL RELEASE Bilateral   . CORONARY ANGIOPLASTY WITH STENT PLACEMENT  07/29/2016  . CORONARY ARTERY BYPASS GRAFT N/A 07/24/2020   Procedure: CORONARY ARTERY BYPASS GRAFTING (CABG), ON PUMP, TIMES FOUR, USING LEFT INTERNAL MAMMARY ARTERY AND ENDOSCOPICALLY HARVESTED RIGHT GREATER SAPHENOUS VEIN;  Surgeon: Lajuana Matte, MD;  Location: [facility];  Service: Open Heart Surgery;  Laterality: N/A;  FLOW TAC  . CORONARY STENT INTERVENTION N/A 05/30/2019   Procedure: CORONARY STENT INTERVENTION;  Surgeon: Belva Crome, MD;  Location: [facility];  Service: Cardiovascular;  Laterality: N/A;  . CORONARY STENT INTERVENTION N/A 11/02/2019   Procedure: CORONARY STENT INTERVENTION;  Surgeon: Wellington Hampshire, MD;  Location: [facility];  Service: Cardiovascular;  Laterality: N/A;  . ESOPHAGOGASTRODUODENOSCOPY N/A 09/22/2017   Procedure: ESOPHAGOGASTRODUODENOSCOPY (EGD);  Surgeon: Ladene Artist, MD;  Location: [facility];  Service: Endoscopy;   Laterality: N/A;  . INTRAVASCULAR PRESSURE WIRE/FFR STUDY N/A 07/22/2020   Procedure: INTRAVASCULAR PRESSURE WIRE/FFR STUDY;  Surgeon: Leonie Man, MD;  Location: [place] [facility];  Service: Cardiovascular;  Laterality: N/A;  . KNEE ARTHROSCOPY Bilateral    "2 on left; 1 on the right"  . LEFT HEART CATH AND CORONARY ANGIOGRAPHY N/A 11/02/2019   Procedure: LEFT HEART CATH AND CORONARY ANGIOGRAPHY;  Surgeon: Wellington Hampshire, MD;  Location: [facility];  Service: Cardiovascular;  Laterality: N/A;  . LEFT HEART CATH AND CORONARY ANGIOGRAPHY N/A 07/22/2020   Procedure: LEFT HEART CATH AND CORONARY ANGIOGRAPHY;  Surgeon: Leonie Man, MD;  Location: [facility];  Service: Cardiovascular;  Laterality: N/A;  . PERIPHERAL VASCULAR INTERVENTION Left 10/11/2020   popliteal and SFA stent placement   . PERIPHERAL VASCULAR INTERVENTION Left 10/10/2020   Procedure: PERIPHERAL VASCULAR INTERVENTION;  Surgeon: Elam Dutch, MD;  Location: [facility];  Service: Cardiovascular;  Laterality: Left;  . RIGHT/LEFT HEART CATH AND CORONARY ANGIOGRAPHY N/A 05/30/2019   Procedure: RIGHT/LEFT HEART CATH  AND CORONARY ANGIOGRAPHY;  Surgeon: Belva Crome, MD;  Location: [facility];  Service: Cardiovascular;  Laterality: N/A;  . SHOULDER OPEN ROTATOR CUFF REPAIR Bilateral   . TEE WITHOUT CARDIOVERSION N/A 07/24/2020   Procedure: TRANSESOPHAGEAL ECHOCARDIOGRAM (TEE);  Surgeon: Lajuana Matte, MD;  Location: [facility];  Service: Open Heart Surgery;  Laterality: N/A;       Family History  Problem Relation Age of Onset  . Hypertension Brother   . Hypertension Father   . Diabetes Other   . Hyperlipidemia Other     Social History   Tobacco Use  . Smoking status: Former Smoker    Packs/day: 1.00    Years: 34.00    Pack years: 34.00    Types: Cigarettes    Quit date: 07/12/2020    Years since quitting: 0.7  . Smokeless tobacco: Former Systems developer    Types: Snuff, Chew  Vaping  Use  . Vaping Use: Never used  Substance Use Topics  . Alcohol use: Yes    Comment: RARE  . Drug use: No    Home Medications Prior to Admission medications   Medication Sig Start Date End Date Taking? Authorizing Provider  albuterol (VENTOLIN HFA) 108 (90 Base) MCG/ACT inhaler Inhale 2 puffs into the lungs every 6 (six) hours as needed for wheezing or shortness of breath.    [provider]  ALPRAZolam Duanne Moron) 1 MG tablet Take 1 mg by mouth See admin instructions. 1 tablet by mouth twice daily and 2 tabs at bedtime.    [provider]  aspirin EC 81 MG tablet Take 1 tablet (81 mg total) by mouth daily. Swallow whole. 03/14/21   Jeralyn Bennett, MD  atorvastatin (LIPITOR) 80 MG tablet Take 1 tablet (80 mg total) by mouth daily at 6 PM. 05/31/19   Sande Rives E, PA-C  blood glucose meter kit and supplies KIT Dispense based on patient and insurance preference. Use up to four times daily as directed. (FOR ICD-9 250.00, 250.01). 09/23/17   Hongalgi, Lenis Dickinson, MD  buPROPion (WELLBUTRIN SR) 150 MG 12 hr tablet Take 150 mg by mouth 2 (two) times daily.  08/17/16   [provider]  clopidogrel (PLAVIX) 75 MG tablet Take 1 tablet (75 mg total) by mouth daily with breakfast. 10/11/20   Ulyses Amor, PA-C  Continuous Blood Gluc Sensor (FREESTYLE LIBRE 14 DAY SENSOR) MISC Inject 1 patch into the skin every 14 (fourteen) days.    [provider]  escitalopram (LEXAPRO) 10 MG tablet Take 10 mg by mouth in the morning. 06/10/20   [provider]  fenofibrate (TRICOR) 145 MG tablet Take 145 mg by mouth in the morning.    [provider]  fluconazole (DIFLUCAN) 150 MG tablet Take 150 mg by mouth every Monday.    [provider]  fluticasone (FLONASE) 50 MCG/ACT nasal spray Place 2 sprays into both nostrils in the morning. 03/14/19   [provider]  Fluticasone Propionate, Inhal, (FLOVENT DISKUS) 250 MCG/BLIST AEPB Inhale 2 puffs into the  lungs in the morning and at bedtime.    [provider]  furosemide (LASIX) 40 MG tablet Take 1 tablet (40 mg total) by mouth daily. 11/12/20 02/10/21  Skeet Latch, MD  insulin lispro (HUMALOG KWIKPEN) 100 UNIT/ML KwikPen Inject 12 Units into the skin 3 (three) times daily. 03/14/21   Jeralyn Bennett, MD  LANTUS SOLOSTAR 100 UNIT/ML Solostar Pen Inject 46 Units into the skin daily before breakfast AND 52 Units  at bedtime. 03/14/21   Jeralyn Bennett, MD  meclizine (ANTIVERT) 25 MG tablet Take 25 mg by mouth 3 (three) times daily as needed for dizziness. Take 50 mg by mouth in the morning & 25 mg by mouth at night 04/10/13   [provider]  metFORMIN (GLUCOPHAGE) 1000 MG tablet Take 1 tablet (1,000 mg total) by mouth 2 (two) times daily with a meal. 11/05/19   Isaiah Serge, NP  midodrine (PROAMATINE) 5 MG tablet Take 5 mg by mouth 3 (three) times daily with meals.    [provider]  mupirocin cream (BACTROBAN) 2 % Apply topically daily. 03/15/21   Jeralyn Bennett, MD  naloxone [facility]) nasal spray 4 mg/0.1 mL Place 4 mg into the nose as needed (overdose).    [provider]  nitroGLYCERIN (NITROSTAT) 0.4 MG SL tablet Place 0.4 mg under the tongue every 5 (five) minutes as needed for chest pain.    [provider]  oxyCODONE (ROXICODONE) 15 MG immediate release tablet Take 1 tablet (15 mg total) by mouth 5 (five) times daily. 03/14/21   Jeralyn Bennett, MD  pantoprazole (PROTONIX) 40 MG tablet Take 1 tablet (40 mg total) by mouth daily. Patient taking differently: Take 80 mg by mouth daily before breakfast. 09/23/17   Hongalgi, Lenis Dickinson, MD  pregabalin (LYRICA) 150 MG capsule Take 150 mg by mouth 3 (three) times daily. 05/06/19   [provider]  promethazine (PHENERGAN) 25 MG tablet Take 25 mg by mouth every 4 (four) hours as needed for nausea or vomiting.    [provider]  sulfamethoxazole-trimethoprim (BACTRIM DS) 800-160 MG tablet  Take 2 tablets by mouth 2 (two) times daily. 03/14/21   Jeralyn Bennett, MD  VICTOZA 18 MG/3ML SOPN Inject 1.2 mg into the skin at bedtime. 04/30/19   [provider]    Allergies    Sulfa antibiotics  Review of Systems   Review of Systems  Cardiovascular: Positive for chest pain.  Skin: Positive for rash.  All other systems reviewed and are negative.   Physical Exam Updated Vital Signs BP 98/68   Pulse 100   Temp 99.6 F (37.6 C)   Resp (!) 28   Ht _0  (1.956 m)   Wt (!) 169.6 kg   SpO2 99%   BMI 44.35 kg/m   Physical Exam Vitals and nursing note reviewed.  Constitutional:      Appearance: He is well-developed.  HENT:     Head: Normocephalic and atraumatic.  Eyes:     Extraocular Movements: Extraocular movements intact.     Pupils: Pupils are equal, round, and reactive to light.  Cardiovascular:     Rate and Rhythm: Regular rhythm. Tachycardia present.     Heart sounds: Normal heart sounds.  Pulmonary:     Effort: Pulmonary effort is normal.     Breath sounds: Normal breath sounds.  Abdominal:     General: Bowel sounds are normal.     Palpations: Abdomen is soft.  Musculoskeletal:        General: Normal range of motion.     Cervical back: Normal range of motion and neck supple.     Comments: Several amputated toes to both feet  Skin:    General: Skin is warm.     Capillary Refill: Capillary refill takes less than 2 seconds.     Comments: LLE extremity with cellulitis.  Lymphangitis.  See picture.   Neurological:     General: No focal deficit present.  Mental Status: He is alert and oriented to person, place, and time.       ED Results / Procedures / Treatments   Labs (all labs ordered are listed, but only abnormal results are displayed) Labs Reviewed  BASIC METABOLIC PANEL - Abnormal; Notable for the following components:      Result Value   Sodium 133 (*)    Glucose, Bld 396 (*)    BUN 23 (*)    Creatinine, Ser 1.70 (*)    GFR,  Estimated 49 (*)    All other components within normal limits  CBC - Abnormal; Notable for the following components:   WBC 12.4 (*)    Hemoglobin 12.0 (*)    MCH 25.7 (*)    RDW 15.6 (*)    All other components within normal limits  LACTIC ACID, PLASMA - Abnormal; Notable for the following components:   Lactic Acid, Venous 3.7 (*)    All other components within normal limits  HEPATIC FUNCTION PANEL - Abnormal; Notable for the following components:   Albumin 3.2 (*)    All other components within normal limits  TROPONIN I (HIGH SENSITIVITY) - Abnormal; Notable for the following components:   Troponin I (High Sensitivity) 25 (*)    All other components within normal limits  CULTURE, BLOOD (ROUTINE X 2)  CULTURE, BLOOD (ROUTINE X 2)  SARS CORONAVIRUS 2 (TAT 6-24 HRS)  LACTIC ACID, PLASMA  TROPONIN I (HIGH SENSITIVITY)    EKG EKG Interpretation  Date/Time:  Saturday April 11 2021 17:03:58 EDT Ventricular Rate:  105 PR Interval:  162 QRS Duration: 119 QT Interval:  335 QTC Calculation: 443 R Axis:   -58 Text Interpretation: Sinus tachycardia Incomplete right bundle branch block Inferior infarct, old Consider anterior infarct Lateral leads are also involved No significant change since last tracing Confirmed by Isla Pence [phone]) on 04/11/2021 5:39:46 PM   Radiology DG Chest Port 1 View  Result Date: 04/11/2021 CLINICAL DATA:  Chest pain. EXAM: PORTABLE CHEST 1 VIEW COMPARISON:  February 18, 2021 FINDINGS: Sternotomy wires are intact. The cardiomediastinal silhouette is stable. Evaluation of the lungs is limited due to the low volume portable technique but no focal infiltrate is identified. No pneumothorax. IMPRESSION: No active disease. Electronically Signed   By: Dorise Bullion III M.D   On: 04/11/2021 17:25    Procedures Procedures   Medications Ordered in ED Medications  vancomycin (VANCOCIN) IVPB 1000 mg/200 mL premix (1,000 mg Intravenous New Bag/Given 04/11/21 1840)   insulin aspart (novoLOG) injection 8 Units (has no administration in time range)  sodium chloride 0.9 % bolus 1,000 mL (has no administration in time range)  piperacillin-tazobactam (ZOSYN) IVPB 3.375 g (0 g Intravenous Stopped 04/11/21 1835)    ED Course  I have reviewed the triage vital signs and the nursing notes.  Pertinent labs & imaging results that were available during my care of the patient were reviewed by me and considered in my medical decision making (see chart for details).    MDM Rules/Calculators/A&P                         Pt given vancomycin and zosyn for his cellulitis.  His lactic acid is 3.7.  He has a hx of CHF, but is given 1L of NS.  I will reevaluate his resp status after that L.  Pt's bp is low, but he chronically has hypotension and is on midodrine.  Cardiac work up is negative  for now.   BS is 396.  Pt given IVFs and 8 units of insulin.  Pt d/w Dr. Nehemiah Settle (triad) for admission.  Final Clinical Impression(s) / ED Diagnoses Final diagnoses:  Cellulitis of left lower extremity  Poorly controlled type 2 diabetes mellitus ([place])  Chest pain, unspecified type    Rx / DC Orders ED Discharge Orders    None       Isla Pence, MD 04/11/21 1909

## 2021-04-11 NOTE — Progress Notes (Signed)
Pharmacy Antibiotic Note  Joseph [name] is a 50 y.o. male admitted on 04/11/2021 with sepsis.  Pharmacy has been consulted for Vancomycin and Cefepime dosing.  Plan: Vancomycin 1000 IV every 8 hours.  Goal trough 10-15 mcg/mL.  Cefepime 2gm IV q8hrs  Height: 6\' 5"  (195.6 cm) Weight: (!) 169.6 kg (374 lb) IBW/kg (Calculated) : 89.1  Temp (24hrs), Avg:99.6 F (37.6 C), Min:99.6 F (37.6 C), Max:99.6 F (37.6 C)  Recent Labs  Lab 04/11/21 1744 04/11/21 1922  WBC 12.4*  --   CREATININE 1.70*  --   LATICACIDVEN 3.7* 2.4*    Estimated Creatinine Clearance: 91.2 mL/min (A) (by C-G formula based on SCr of 1.7 mg/dL (H)).    Allergies  Allergen Reactions  . Sulfa Antibiotics Other (See Comments)    Headaches     Antimicrobials this admission:   >>    >>   Dose adjustments this admission:   Microbiology results:  BCx:   UCx:    Sputum:    MRSA PCR:   Thank you for allowing pharmacy to be a part of this patient's care.  04/13/21 A 04/11/2021 9:11 PM

## 2021-04-11 NOTE — H&P (Signed)
History and Physical  Connar Keating Hosek UTM:[id number] DOB: 07-22-1971 DOA: 04/11/2021  Referring physician: Dr Gilford Raid, ED physician PCP: [name], NP  Outpatient Specialists:  Patient Coming From: Home  Chief Complaint: leg swelling, redness  HPI: Payden Docter Alter is a 50 y.o. male with a history of uncontrolled insulin-dependent type 2 diabetes, coronary artery disease with history of NSTEMI and CABG x4 vessels and multiple stents, COPD, history of PE and DVT not currently on anticoagulation, peripheral artery disease on dual platelet therapy, recent hospitalization for left lower extremity cellulitis.  Patient seen for cellulitis of left lower leg that has been increasing over the past 3 to 4 days.  He has noted increasing redness, swelling, warmth.  No palliating or provoking factors.  During his recent hospitalization, the patient had an aortogram which not show any reversible ischemic lesions.  Patient also complaining of chest pain patient's chest pain is improving.  Emergency Department Course: Patient started on vancomycin and Zosyn after blood cultures obtained.  Review of Systems:   Pt denies any fevers, chills, nausea, vomiting, diarrhea, constipation, abdominal pain, shortness of breath, dyspnea on exertion, orthopnea, cough, wheezing, palpitations, headache, vision changes, lightheadedness, dizziness, melena, rectal bleeding.  Review of systems are otherwise negative  Past Medical History:  Diagnosis Date  . Anxiety   . Arthritis    "knees, shoulders, hips, ankles" (07/29/2016)  . Asthma   . CAD (coronary artery disease)    a. 2017: s/p BMS to distal Cx; b. LHC 05/30/2019: 80% mid, distal RCA s/p DES, 30% narrowing of d LM, widely patent LAD w/ luminal irregularities, widely patent stent in [place]  w/ 90+% stenosis distal to stent beofre small trifurcating obtuse marginal (potentially area of restenosis)  . Chronic bronchitis ([place])   . Chronic ulcer of right great toe  ([place]) 09/19/2017  . COPD (chronic obstructive pulmonary disease) ([place])   . Depression   . DVT (deep venous thrombosis) ([place])   . GERD (gastroesophageal reflux disease)   . Hyperlipidemia   . Hypertension   . Myocardial infarction ([place]) 1996   "light one"  . PE (pulmonary embolism) 04/2013   On chronic Xarelto  . Peripheral nerve disease   . Pneumonia "several times"  . Sleep apnea   . Type II diabetes mellitus ([place])    Past Surgical History:  Procedure Laterality Date  . ABDOMINAL AORTOGRAM W/LOWER EXTREMITY N/A 10/10/2020   Procedure: ABDOMINAL AORTOGRAM W/LOWER EXTREMITY;  Surgeon: Elam Dutch, MD;  Location: [place] [facility];  Service: Cardiovascular;  Laterality: N/A;  . AMPUTATION Right 09/21/2017   Procedure: RIGHT GREAT TOE AMPUTATION, POSSIBLE VAC;  Surgeon: Leandrew Koyanagi, MD;  Location: [place];  Service: Orthopedics;  Laterality: Right;  . AMPUTATION Left 08/13/2020   Procedure: LEFT FOOT 4TH RAY AMPUTATION;  Surgeon: Newt Minion, MD;  Location: [place];  Service: Orthopedics;  Laterality: Left;  . AORTOGRAM Bilateral 03/13/2021   Procedure: ABDOMINAL AORTOGRAM WITH Left LOWER EXTREMITY RUNOFF;  Surgeon: Cherre Robins, MD;  Location: [place];  Service: Vascular;  Laterality: Bilateral;  . CARDIAC CATHETERIZATION  2006  . CARDIAC CATHETERIZATION  1996   "@ [facility]; when I had my heart attack"  . CARDIAC CATHETERIZATION N/A 07/29/2016   Procedure: Left Heart Cath and Coronary Angiography;  Surgeon: Lorretta Harp, MD;  Location: [place] [facility];  Service: Cardiovascular;  Laterality: N/A;  . CARDIAC CATHETERIZATION N/A 07/29/2016   Procedure: Coronary Stent Intervention;  Surgeon: Lorretta Harp, MD;  Location: [place] [facility];  Service: Cardiovascular;  Laterality: N/A;  . CARPAL TUNNEL RELEASE Bilateral   . CORONARY ANGIOPLASTY WITH STENT PLACEMENT  07/29/2016  . CORONARY ARTERY BYPASS GRAFT N/A 07/24/2020   Procedure: CORONARY ARTERY BYPASS GRAFTING (CABG), ON  PUMP, TIMES FOUR, USING LEFT INTERNAL MAMMARY ARTERY AND ENDOSCOPICALLY HARVESTED RIGHT GREATER SAPHENOUS VEIN;  Surgeon: Lajuana Matte, MD;  Location: [place];  Service: Open Heart Surgery;  Laterality: N/A;  FLOW TAC  . CORONARY STENT INTERVENTION N/A 05/30/2019   Procedure: CORONARY STENT INTERVENTION;  Surgeon: Belva Crome, MD;  Location: [place] [facility];  Service: Cardiovascular;  Laterality: N/A;  . CORONARY STENT INTERVENTION N/A 11/02/2019   Procedure: CORONARY STENT INTERVENTION;  Surgeon: Wellington Hampshire, MD;  Location: [place] [facility];  Service: Cardiovascular;  Laterality: N/A;  . ESOPHAGOGASTRODUODENOSCOPY N/A 09/22/2017   Procedure: ESOPHAGOGASTRODUODENOSCOPY (EGD);  Surgeon: Ladene Artist, MD;  Location[facility];  Service: Endoscopy;  Laterality: N/A;  . INTRAVASCULAR PRESSURE WIRE/FFR STUDY N/A 07/22/2020   Procedure: INTRAVASCULAR PRESSURE WIRE/FFR STUDY;  Surgeon: Leonie Man, MD;  Location: [place] [facility];  Service: Cardiovascular;  Laterality: N/A;  . KNEE ARTHROSCOPY Bilateral    "2 on left; 1 on the right"  . LEFT HEART CATH AND CORONARY ANGIOGRAPHY N/A 11/02/2019   Procedure: LEFT HEART CATH AND CORONARY ANGIOGRAPHY;  Surgeon: Wellington Hampshire, MD;  Location: [place] [facility];  Service: Cardiovascular;  Laterality: N/A;  . LEFT HEART CATH AND CORONARY ANGIOGRAPHY N/A 07/22/2020   Procedure: LEFT HEART CATH AND CORONARY ANGIOGRAPHY;  Surgeon: Leonie Man, MD;  Location: [place] [facility];  Service: Cardiovascular;  Laterality: N/A;  . PERIPHERAL VASCULAR INTERVENTION Left 10/11/2020   popliteal and SFA stent placement   . PERIPHERAL VASCULAR INTERVENTION Left 10/10/2020   Procedure: PERIPHERAL VASCULAR INTERVENTION;  Surgeon: Elam Dutch, MD;  Location: [place] [facility];  Service: Cardiovascular;  Laterality: Left;  . RIGHT/LEFT HEART CATH AND CORONARY ANGIOGRAPHY N/A 05/30/2019   Procedure: RIGHT/LEFT HEART CATH AND CORONARY  ANGIOGRAPHY;  Surgeon: Belva Crome, MD;  Location: [place] [facility];  Service: Cardiovascular;  Laterality: N/A;  . SHOULDER OPEN ROTATOR CUFF REPAIR Bilateral   . TEE WITHOUT CARDIOVERSION N/A 07/24/2020   Procedure: TRANSESOPHAGEAL ECHOCARDIOGRAM (TEE);  Surgeon: Lajuana Matte, MD;  Location: [place];  Service: Open Heart Surgery;  Laterality: N/A;   Social History:  reports that he quit smoking about 8 months ago. His smoking use included cigarettes. He has a 34.00 pack-year smoking history. He has quit using smokeless tobacco.  His smokeless tobacco use included snuff and chew. He reports current alcohol use. He reports that he does not use drugs. Patient lives at home  Allergies  Allergen Reactions  . Sulfa Antibiotics Other (See Comments)    Headaches     Family History  Problem Relation Age of Onset  . Hypertension Brother   . Hypertension Father   . Diabetes Other   . Hyperlipidemia Other       Prior to Admission medications   Medication Sig Start Date End Date Taking? Authorizing Provider  albuterol (VENTOLIN HFA) 108 (90 Base) MCG/ACT inhaler Inhale 2 puffs into the lungs every 6 (six) hours as needed for wheezing or shortness of breath.   Yes [provider]  ALPRAZolam Duanne Moron) 1 MG tablet Take 1 mg by mouth See admin instructions. 1 tablet by mouth twice daily and 2 tabs at bedtime.  Yes [provider]  aspirin EC 81 MG tablet Take 1 tablet (81 mg total) by mouth daily. Swallow whole. 03/14/21  Yes Jeralyn Bennett, MD  atorvastatin (LIPITOR) 80 MG tablet Take 1 tablet (80 mg total) by mouth daily at 6 PM. 05/31/19  Yes Sande Rives E, PA-C  blood glucose meter kit and supplies KIT Dispense based on patient and insurance preference. Use up to four times daily as directed. (FOR ICD-9 250.00, 250.01). 09/23/17  Yes Hongalgi, Lenis Dickinson, MD  buPROPion (WELLBUTRIN SR) 150 MG 12 hr tablet Take 150 mg by mouth 2 (two) times daily.  08/17/16  Yes [provider]  clopidogrel (PLAVIX) 75 MG tablet Take 1 tablet (75 mg total) by mouth daily with breakfast. 10/11/20  Yes Ulyses Amor, PA-C  Continuous Blood Gluc Sensor (FREESTYLE LIBRE 14 DAY SENSOR) MISC Inject 1 patch into the skin every 14 (fourteen) days.   Yes [provider]  escitalopram (LEXAPRO) 10 MG tablet Take 10 mg by mouth in the morning. 06/10/20  Yes [provider]  fenofibrate (TRICOR) 145 MG tablet Take 145 mg by mouth in the morning.   Yes [provider]  fluconazole (DIFLUCAN) 150 MG tablet Take 150 mg by mouth every Monday.   Yes [provider]  fluticasone (FLONASE) 50 MCG/ACT nasal spray Place 2 sprays into both nostrils in the morning. 03/14/19  Yes [provider]  Fluticasone Propionate, Inhal, (FLOVENT DISKUS) 250 MCG/BLIST AEPB Inhale 2 puffs into the lungs in the morning and at bedtime.   Yes [provider]  furosemide (LASIX) 40 MG tablet Take 1 tablet (40 mg total) by mouth daily. 11/12/20 02/10/21 Yes Skeet Latch, MD  insulin lispro (HUMALOG KWIKPEN) 100 UNIT/ML KwikPen Inject 12 Units into the skin 3 (three) times daily. Patient taking differently: Inject 0-12 Units into the skin 3 (three) times daily. Sliding Scale 03/14/21  Yes Jeralyn Bennett, MD  LANTUS SOLOSTAR 100 UNIT/ML Solostar Pen Inject 46 Units into the skin daily before breakfast AND 52 Units at bedtime. 03/14/21  Yes Jeralyn Bennett, MD  meclizine (ANTIVERT) 25 MG tablet Take 25 mg by mouth 3 (three) times daily as needed for dizziness. Take 50 mg by mouth in the morning & 25 mg by mouth at night 04/10/13  Yes [provider]  metFORMIN (GLUCOPHAGE) 1000 MG tablet Take 1 tablet (1,000 mg total) by mouth 2 (two) times daily with a meal. 11/05/19  Yes Isaiah Serge, NP  midodrine (PROAMATINE) 5 MG tablet Take 5 mg by mouth 3 (three) times daily with meals.   Yes [provider]  mupirocin cream (BACTROBAN) 2 % Apply  topically daily. 03/15/21  Yes Jeralyn Bennett, MD  naloxone [facility]) nasal spray 4 mg/0.1 mL Place 4 mg into the nose as needed (overdose).   Yes [provider]  nitroGLYCERIN (NITROSTAT) 0.4 MG SL tablet Place 0.4 mg under the tongue every 5 (five) minutes as needed for chest pain.   Yes [provider]  oxyCODONE (ROXICODONE) 15 MG immediate release tablet Take 1 tablet (15 mg total) by mouth 5 (five) times daily. 03/14/21  Yes Jeralyn Bennett, MD  pantoprazole (PROTONIX) 40 MG tablet Take 1 tablet (40 mg total) by mouth daily. Patient taking differently: Take 80 mg by mouth daily before breakfast. 09/23/17  Yes Hongalgi, Lenis Dickinson, MD  pregabalin (LYRICA) 150 MG capsule Take 150 mg by mouth 3 (three) times daily. 05/06/19  Yes [provider]  promethazine (PHENERGAN) 25  MG tablet Take 25 mg by mouth every 4 (four) hours as needed for nausea or vomiting.   Yes [provider]  VICTOZA 18 MG/3ML SOPN Inject 1.2 mg into the skin at bedtime. 04/30/19  Yes [provider]    Physical Exam: BP 90/67   Pulse 100   Temp 99.6 F (37.6 C)   Resp (!) 28   Ht 6' 5"  (1.956 m)   Wt (!) 169.6 kg   SpO2 99%   BMI 44.35 kg/m   . General: Middle-age male. Awake and alert and oriented x3. No acute cardiopulmonary distress.  Marland Kitchen HEENT: Normocephalic atraumatic.  Right and left ears normal in appearance.  Pupils equal, round, reactive to light. Extraocular muscles are intact. Sclerae anicteric and noninjected.  Moist mucosal membranes. No mucosal lesions.  . Neck: Neck supple without lymphadenopathy. No carotid bruits. No masses palpated.  . Cardiovascular: Regular rate with normal S1-S2 sounds. No murmurs, rubs, gallops auscultated. No JVD.  Marland Kitchen Respiratory: Good respiratory effort with no wheezes, rales, rhonchi. Lungs clear to auscultation bilaterally.  No accessory muscle use. . Abdomen: Soft, nontender, nondistended. Active bowel sounds. No masses or  hepatosplenomegaly  . Skin: Cellulitis on anterior shin of left lower leg. see picture below.  There is also a healing ulcer on the left foot that is approximately 1 cm in diameter.  This does not appear to be infected and is clean and dry. Dry, warm to touch. 2+ dorsalis pedis and radial pulses. . Musculoskeletal: No calf or leg pain. All major joints not erythematous nontender.  No upper or lower joint deformation.  Good ROM.  No contractures  . Psychiatric: Intact judgment and insight. Pleasant and cooperative. . Neurologic: No focal neurological deficits. Strength is 5/5 and symmetric in upper and lower extremities.  Cranial nerves II through XII are grossly intact.     Media Information         Document Information  Photos    04/11/2021 18:39  Attached To:  Hospital Encounter on 04/11/21   Source Information  Isla Pence, MD  [facility]          Labs on Admission: I have personally reviewed following labs and imaging studies  CBC: Recent Labs  Lab 04/11/21 1744  WBC 12.4*  HGB 12.0*  HCT 39.0  MCV 83.5  PLT 329   Basic Metabolic Panel: Recent Labs  Lab 04/11/21 1744  NA 133*  K 4.4  CL 100  CO2 22  GLUCOSE 396*  BUN 23*  CREATININE 1.70*  CALCIUM 9.2   GFR: Estimated Creatinine Clearance: 91.2 mL/min (A) (by C-G formula based on SCr of 1.7 mg/dL (H)). Liver Function Tests: Recent Labs  Lab 04/11/21 1744  AST 21  ALT 33  ALKPHOS 85  BILITOT 0.6  PROT 6.8  ALBUMIN 3.2*   No results for input(s): LIPASE, AMYLASE in the last 168 hours. No results for input(s): AMMONIA in the last 168 hours. Coagulation Profile: No results for input(s): INR, PROTIME in the last 168 hours. Cardiac Enzymes: No results for input(s): CKTOTAL, CKMB, CKMBINDEX, TROPONINI in the last 168 hours. BNP (last 3 results) No results for input(s): PROBNP in the last 8760 hours. HbA1C: No results for input(s): HGBA1C in the last 72 hours. CBG: Recent Labs   Lab 04/11/21 1928  GLUCAP 306*   Lipid Profile: No results for input(s): CHOL, HDL, LDLCALC, TRIG, CHOLHDL, LDLDIRECT in the last 72 hours. Thyroid Function Tests: No results for input(s): TSH, T4TOTAL, FREET4,  T3FREE, THYROIDAB in the last 72 hours. Anemia Panel: No results for input(s): VITAMINB12, FOLATE, FERRITIN, TIBC, IRON, RETICCTPCT in the last 72 hours. Urine analysis:    Component Value Date/Time   COLORURINE YELLOW 02/18/2021 1900   APPEARANCEUR CLEAR 02/18/2021 1900   LABSPEC 1.014 02/18/2021 1900   PHURINE 5.0 02/18/2021 1900   GLUCOSEU 150 (A) 02/18/2021 1900   HGBUR NEGATIVE 02/18/2021 1900   BILIRUBINUR NEGATIVE 02/18/2021 1900   KETONESUR NEGATIVE 02/18/2021 1900   PROTEINUR 30 (A) 02/18/2021 1900   UROBILINOGEN 0.2 10/27/2007 1550   NITRITE NEGATIVE 02/18/2021 1900   LEUKOCYTESUR NEGATIVE 02/18/2021 1900   Sepsis Labs: @LABRCNTIP (procalcitonin:4,lacticidven:4) ) Recent Results (from the past 240 hour(s))  Culture, blood (routine x 2)     Status: None (Preliminary result)   Collection Time: 04/11/21  5:44 PM   Specimen: BLOOD RIGHT ARM  Result Value Ref Range Status   Specimen Description BLOOD RIGHT ARM  Final   Special Requests   Final    BOTTLES DRAWN AEROBIC AND ANAEROBIC Blood Culture adequate volume Performed at [facility], [address], [place], [place] [address]    Culture PENDING  Incomplete   Report Status PENDING  Incomplete  Culture, blood (routine x 2)     Status: None (Preliminary result)   Collection Time: 04/11/21  5:44 PM   Specimen: BLOOD RIGHT HAND  Result Value Ref Range Status   Specimen Description BLOOD RIGHT HAND  Final   Special Requests   Final    BOTTLES DRAWN AEROBIC AND ANAEROBIC Blood Culture adequate volume Performed at [facility], [address] [place], [place] [address]    Culture PENDING  Incomplete   Report Status PENDING  Incomplete     Radiological Exams on Admission: DG Chest Port 1 View  Result  Date: 04/11/2021 CLINICAL DATA:  Chest pain. EXAM: PORTABLE CHEST 1 VIEW COMPARISON:  February 18, 2021 FINDINGS: Sternotomy wires are intact. The cardiomediastinal silhouette is stable. Evaluation of the lungs is limited due to the low volume portable technique but no focal infiltrate is identified. No pneumothorax. IMPRESSION: No active disease. Electronically Signed   By: Dorise Bullion III M.D   On: 04/11/2021 17:25    EKG: Independently reviewed.  Sinus tachycardia with incomplete bundle branch block.  Old inferior MI.  No acute ST changes.  Unchanged from previous  Assessment/Plan: Principal Problem:   Severe sepsis --Severe Sepsis secondary to Lt leg/Foot Cellulitis with hypotension and AKI Active Problems:   History of pulmonary embolus (PE)   Uncontrolled type 2 diabetes mellitus with hyperglycemia, with long-term current use of insulin (HCC)   Chronic back pain   Obesity, Class III, BMI 40-49.9 (morbid obesity) (HCC)   AKI (acute kidney injury) (HCC)   Non-ST elevation (NSTEMI) myocardial infarction (HCC)   S/P CABG x 4   PAD (peripheral artery disease) (HCC)   Left leg cellulitis    This patient was discussed with the ED physician, including pertinent vitals, physical exam findings, labs, and imaging.  We also discussed care given by the ED provider.  1. Severe sepsis with left lower leg cellulitis  a. Change antibiotics to vancomycin and cefepime b. Blood cultures pending c. Repeat CBC in the morning d. Lactic acid downtrending with fluid boluses. 2. Insulin-dependent type 2 diabetes with hyperglycemia and poor control a. Some improvement patient CBGs with insulin given in the ED.  Continue long-acting insulin with mealtime coverage and sliding scale 3. AKI on stage III chronic kidney disease a. Check  creatinine in the morning 4. Elevated troponins a. Stable with no change 5. Peripheral artery disease a. On dual platelet therapy. 6. Coronary artery disease with history of  NSTEMI and CABG a. Continue aspirin b. Patient does have mild hypotension and takes midodrine 7. Chronic pain a. Continue home pain regimen 8. History of DVT and PE a. Not currently on anticoagulation.  It appears that the patient has been on anticoagulation in the past.  Patient is unaware of why he is not currently on anticoagulation.  It appears that it was stopped when the patient was started on dual platelet treatment for his peripheral artery disease. b. While dual platelet therapy may not completely cover the patient for PE/DVT, studies are clear that patient would be at major risk for severe GI bleed on both oral anticoagulation and dual platelet therapy.  DVT prophylaxis: Lovenox for DVT prophylaxis Consultants: None Code Status: Full code Family Communication: None Disposition Plan: Patient should be able to return home following improvement of cellulitis   Truett Mainland, DO

## 2021-04-11 NOTE — Progress Notes (Signed)
Questioned patient about use of CPAP. Patient states he has not used home unit in 15 years. He stated only use of late was in hospital and he was fine not using unit in hospital. He declined use of CPAP.

## 2021-04-12 DIAGNOSIS — R6520 Severe sepsis without septic shock: Secondary | ICD-10-CM

## 2021-04-12 DIAGNOSIS — A419 Sepsis, unspecified organism: Principal | ICD-10-CM

## 2021-04-12 LAB — BLOOD CULTURE ID PANEL (REFLEXED) - BCID2

## 2021-04-12 LAB — CBC
HCT: 39.4 % (ref 39.0–52.0)
Hemoglobin: 12.1 g/dL — ABNORMAL LOW (ref 13.0–17.0)
MCH: 26 pg (ref 26.0–34.0)
MCHC: 30.7 g/dL (ref 30.0–36.0)
MCV: 84.5 fL (ref 80.0–100.0)
Platelets: 210 10*3/uL (ref 150–400)
RBC: 4.66 MIL/uL (ref 4.22–5.81)
RDW: 15.6 % — ABNORMAL HIGH (ref 11.5–15.5)
WBC: 9.4 10*3/uL (ref 4.0–10.5)
nRBC: 0 % (ref 0.0–0.2)

## 2021-04-12 LAB — BASIC METABOLIC PANEL
Anion gap: 9 (ref 5–15)
BUN: 22 mg/dL — ABNORMAL HIGH (ref 6–20)
CO2: 24 mmol/L (ref 22–32)
Calcium: 9.2 mg/dL (ref 8.9–10.3)
Chloride: 105 mmol/L (ref 98–111)
Creatinine, Ser: 1.44 mg/dL — ABNORMAL HIGH (ref 0.61–1.24)
GFR, Estimated: 60 mL/min — ABNORMAL LOW (ref >60)
Glucose, Bld: 305 mg/dL — ABNORMAL HIGH (ref 70–99)
Potassium: 5 mmol/L (ref 3.5–5.1)
Sodium: 138 mmol/L (ref 135–145)

## 2021-04-12 LAB — GLUCOSE, CAPILLARY
Glucose-Capillary: 282 mg/dL — ABNORMAL HIGH (ref 70–99)
Glucose-Capillary: 284 mg/dL — ABNORMAL HIGH (ref 70–99)
Glucose-Capillary: 79 mg/dL (ref 70–99)
Glucose-Capillary: 153 mg/dL — ABNORMAL HIGH (ref 70–99)

## 2021-04-12 LAB — LACTIC ACID, PLASMA
Lactic Acid, Venous: 1.8 mmol/L (ref 0.5–1.9)
Lactic Acid, Venous: 2 mmol/L (ref 0.5–1.9)

## 2021-04-12 LAB — SARS CORONAVIRUS 2 (TAT 6-24 HRS): SARS Coronavirus 2: NEGATIVE

## 2021-04-12 MED ORDER — LACTATED RINGERS IV SOLN: INTRAVENOUS | Status: AC

## 2021-04-12 MED ORDER — INSULIN GLARGINE 100 UNIT/ML ~~LOC~~ SOLN
60.0000 [IU] | Freq: Every day | SUBCUTANEOUS | Status: DC
  Administered 2021-04-12 – 2021-04-13 (×2): 60 [IU] via SUBCUTANEOUS
  Filled 2021-04-12 (×3): qty 0.6

## 2021-04-12 MED ORDER — INSULIN GLARGINE 100 UNIT/ML ~~LOC~~ SOLN
55.0000 [IU] | Freq: Every day | SUBCUTANEOUS | Status: DC
  Administered 2021-04-12: 55 [IU] via SUBCUTANEOUS
  Filled 2021-04-12 (×2): qty 0.55

## 2021-04-12 MED ORDER — LABETALOL HCL 5 MG/ML IV SOLN
10.0000 mg | INTRAVENOUS | Status: DC | PRN
  Filled 2021-04-12: qty 4

## 2021-04-12 MED ORDER — INSULIN GLARGINE 100 UNIT/ML ~~LOC~~ SOLN
60.0000 [IU] | Freq: Every day | SUBCUTANEOUS | Status: DC
  Administered 2021-04-13 – 2021-04-14 (×2): 60 [IU] via SUBCUTANEOUS
  Filled 2021-04-12 (×4): qty 0.6

## 2021-04-12 MED ORDER — INSULIN ASPART 100 UNIT/ML ~~LOC~~ SOLN
20.0000 [IU] | Freq: Three times a day (TID) | SUBCUTANEOUS | Status: DC
  Administered 2021-04-12 – 2021-04-14 (×7): 20 [IU] via SUBCUTANEOUS

## 2021-04-12 NOTE — Progress Notes (Addendum)
PROGRESS NOTE    Joseph Albrighterry W Kashani  WUJ:[id number] DOB: [birth year] DOA: 04/11/2021 PCP: Marva PandaMillsaps, Kimberly, NP   Brief Narrative:   Joseph [name] is a 50 y.o. male with a history of uncontrolled insulin-dependent type 2 diabetes, coronary artery disease with history of NSTEMI and CABG x4 vessels and multiple stents, COPD, history of PE and DVT not currently on anticoagulation, peripheral artery disease on dual platelet therapy, recent hospitalization for left lower extremity cellulitis.  Patient has been readmitted with severe sepsis in the setting of recurrent left lower extremity cellulitis.  Assessment & Plan:   Principal Problem:   Severe sepsis --Severe Sepsis secondary to Lt leg/Foot Cellulitis with hypotension and AKI Active Problems:   History of pulmonary embolus (PE)   Uncontrolled type 2 diabetes mellitus with hyperglycemia, with long-term current use of insulin (HCC)   Chronic back pain   Obesity, Class III, BMI 40-49.9 (morbid obesity) (HCC)   Elevated troponin   AKI (acute kidney injury) (HCC)   Non-ST elevation (NSTEMI) myocardial infarction (HCC)   S/P CABG x 4   PAD (peripheral artery disease) (HCC)   Left leg cellulitis   Severe sepsis with recurrent left lower extremity cellulitis -Continue on vancomycin and cefepime empirically -Follow blood cultures -Follow lactic acid trend which was last noted to be elevated -Continue IV fluid -Recent studies of PAD with no significant occlusions noted -Likely will require PICC line once blood cultures returned negative x48 hours and outpatient IV antibiotics due to difficulty in treating his infection  Insulin-dependent type 2 diabetes with hyperglycemia -Increase mealtime insulin as well as long-acting insulin this morning and follow -Diabetes coordinator recommendations appreciated -Hemoglobin A1c recently 9.9%  AKI on CKD stage II -Near his baseline creatinine of 1.3-1.4 -Appears to have resolved -Continue IV  fluid per sepsis protocol and monitor  Troponin elevation -Stable and likely secondary to demand ischemia -Complains of musculoskeletal chest pain related to prior sternectomy -Continue on oxycodone as prescribed  PAD/CAD with history of NSTEMI and CABG -Continue dual antiplatelet therapy  History of hypotension -Continue midodrine -Labetalol added as needed for blood pressure elevations  History of chronic pain -Oxycodone scheduled 5 times daily -Continue the same and monitor  History of prior DVT/PE -Continue dual antiplatelet therapy for now along with Lovenox for DVT prophylaxis -Needs to have this addressed in the future with PCP regarding need for anticoagulation  Morbid obesity -BMI 42.69 -Lifestyle changes in outpatient setting  DVT prophylaxis: Lovenox Code Status: Full Family Communication: None at bedside, patient will call Disposition Plan:  Status is: Inpatient  Remains inpatient appropriate because:IV treatments appropriate due to intensity of illness or inability to take PO and Inpatient level of care appropriate due to severity of illness   Dispo: The patient is from: Home              Anticipated d/c is to: Home              Patient currently is not medically stable to d/c.   Difficult to place patient No   Consultants:   None  Procedures:   See below  Antimicrobials:  Anti-infectives (From admission, onward)   Start     Dose/Rate Route Frequency Ordered Stop   04/12/21 0200  vancomycin (VANCOCIN) IVPB 1000 mg/200 mL premix        1,000 mg 200 mL/hr over 60 Minutes Intravenous Every 8 hours 04/11/21 2109     04/11/21 2300  vancomycin (VANCOCIN) IVPB 1000 mg/200 mL premix  Status:  Discontinued        1,000 mg 200 mL/hr over 60 Minutes Intravenous  Once 04/11/21 2203 04/11/21 2211   04/11/21 2300  ceFEPIme (MAXIPIME) 2 g in sodium chloride 0.9 % 100 mL IVPB  Status:  Discontinued        2 g 200 mL/hr over 30 Minutes Intravenous  Once  04/11/21 2203 04/11/21 2211   04/11/21 2200  ceFEPIme (MAXIPIME) 2 g in sodium chloride 0.9 % 100 mL IVPB        2 g 200 mL/hr over 30 Minutes Intravenous Every 8 hours 04/11/21 2106     04/11/21 1730  piperacillin-tazobactam (ZOSYN) IVPB 3.375 g        3.375 g 100 mL/hr over 30 Minutes Intravenous  Once 04/11/21 1727 04/11/21 1835   04/11/21 1730  vancomycin (VANCOCIN) IVPB 1000 mg/200 mL premix        1,000 mg 200 mL/hr over 60 Minutes Intravenous  Once 04/11/21 1727 04/11/21 1947       Subjective: Patient seen and evaluated today with some complaints of chest pain noted this morning along the chest wall and area where he has had his sternectomy.  His left leg remains swollen as well.  Objective: Vitals:   04/11/21 2152 04/12/21 0300 04/12/21 0708 04/12/21 0908  BP: 136/76 (!) 150/87 (!) 175/106   Pulse: 93 91 83   Resp: 20 18 20    Temp: 98.5 F (36.9 C) 97.8 F (36.6 C) 97.7 F (36.5 C)   TempSrc: Oral Oral Oral   SpO2: 100% 98% 100% 97%  Weight: (!) 163.3 kg     Height: 6\' 5"  (1.956 m)       Intake/Output Summary (Last 24 hours) at 04/12/2021 0919 Last data filed at 04/12/2021 0518 Gross per 24 hour  Intake 3539.85 ml  Output --  Net 3539.85 ml   Filed Weights   04/11/21 1656 04/11/21 2152  Weight: (!) 169.6 kg (!) 163.3 kg    Examination:  General exam: Appears calm and comfortable, obese Respiratory system: Clear to auscultation. Respiratory effort normal. Cardiovascular system: S1 & S2 heard, RRR.  Gastrointestinal system: Abdomen is soft Central nervous system: Alert and awake Extremities: Edema and erythema as noted in the chart previously to left lower extremity with mild excoriations.  No drainage.  Chest wall tender to palpation. Skin: No significant lesions noted Psychiatry: Flat affect.    Data Reviewed: I have personally reviewed following labs and imaging studies  CBC: Recent Labs  Lab 04/11/21 1744 04/12/21 0459  WBC 12.4* 9.4  HGB  12.0* 12.1*  HCT 39.0 39.4  MCV 83.5 84.5  PLT 223 210   Basic Metabolic Panel: Recent Labs  Lab 04/11/21 1744 04/12/21 0459  NA 133* 138  K 4.4 5.0  CL 100 105  CO2 22 24  GLUCOSE 396* 305*  BUN 23* 22*  CREATININE 1.70* 1.44*  CALCIUM 9.2 9.2   GFR: Estimated Creatinine Clearance: 105.4 mL/min (A) (by C-G formula based on SCr of 1.44 mg/dL (H)). Liver Function Tests: Recent Labs  Lab 04/11/21 1744  AST 21  ALT 33  ALKPHOS 85  BILITOT 0.6  PROT 6.8  ALBUMIN 3.2*   No results for input(s): LIPASE, AMYLASE in the last 168 hours. No results for input(s): AMMONIA in the last 168 hours. Coagulation Profile: No results for input(s): INR, PROTIME in the last 168 hours. Cardiac Enzymes: No results for input(s): CKTOTAL, CKMB, CKMBINDEX, TROPONINI in the last 168 hours. BNP (last  3 results) No results for input(s): PROBNP in the last 8760 hours. HbA1C: No results for input(s): HGBA1C in the last 72 hours. CBG: Recent Labs  Lab 04/11/21 1928 04/11/21 2201 04/12/21 0800  GLUCAP 306* 265* 282*   Lipid Profile: No results for input(s): CHOL, HDL, LDLCALC, TRIG, CHOLHDL, LDLDIRECT in the last 72 hours. Thyroid Function Tests: No results for input(s): TSH, T4TOTAL, FREET4, T3FREE, THYROIDAB in the last 72 hours. Anemia Panel: No results for input(s): VITAMINB12, FOLATE, FERRITIN, TIBC, IRON, RETICCTPCT in the last 72 hours. Sepsis Labs: Recent Labs  Lab 04/11/21 1744 04/11/21 1922  LATICACIDVEN 3.7* 2.4*    Recent Results (from the past 240 hour(s))  Culture, blood (routine x 2)     Status: None (Preliminary result)   Collection Time: 04/11/21  5:44 PM   Specimen: BLOOD RIGHT ARM  Result Value Ref Range Status   Specimen Description BLOOD RIGHT ARM  Final   Special Requests   Final    BOTTLES DRAWN AEROBIC AND ANAEROBIC Blood Culture adequate volume   Culture   Final    NO GROWTH < 24 HOURS Performed at [facility], [address], [place], [name]  [address]    Report Status PENDING  Incomplete  Culture, blood (routine x 2)     Status: None (Preliminary result)   Collection Time: 04/11/21  5:44 PM   Specimen: BLOOD RIGHT HAND  Result Value Ref Range Status   Specimen Description BLOOD RIGHT HAND  Final   Special Requests   Final    BOTTLES DRAWN AEROBIC AND ANAEROBIC Blood Culture adequate volume   Culture   Final    NO GROWTH < 24 HOURS Performed at [facility], [address] [place], [name] [address]    Report Status PENDING  Incomplete  SARS CORONAVIRUS 2 (TAT 6-24 HRS) Nasopharyngeal Nasopharyngeal Swab     Status: None   Collection Time: 04/11/21  6:45 PM   Specimen: Nasopharyngeal Swab  Result Value Ref Range Status   SARS Coronavirus 2 NEGATIVE NEGATIVE Final    Comment: (NOTE) SARS-CoV-2 target nucleic acids are NOT DETECTED.  The SARS-CoV-2 RNA is generally detectable in upper and lower respiratory specimens during the acute phase of infection. Negative results do not preclude SARS-CoV-2 infection, do not rule out co-infections with other pathogens, and should not be used as the sole basis for treatment or other patient management decisions. Negative results must be combined with clinical observations, patient history, and epidemiological information. The expected result is Negative.  Fact Sheet for Patients: HairSlick.no  Fact Sheet for Healthcare Providers: quierodirigir.com  This test is not yet approved or cleared by the [place] FDA and  has been authorized for detection and/or diagnosis of SARS-CoV-2 by FDA under an Emergency Use Authorization (EUA). This EUA will remain  in effect (meaning this test can be used) for the duration of the COVID-19 declaration under Se ction 564(b)(1) of the Act, 21 U.S.C. section 360bbb-3(b)(1), unless the authorization is terminated or revoked sooner.  Performed at [facility], [address],  [place], [name] [address]          Radiology Studies: DG Chest Port 1 View  Result Date: 04/11/2021 CLINICAL DATA:  Chest pain. EXAM: PORTABLE CHEST 1 VIEW COMPARISON:  February 18, 2021 FINDINGS: Sternotomy wires are intact. The cardiomediastinal silhouette is stable. Evaluation of the lungs is limited due to the low volume portable technique but no focal infiltrate is identified. No pneumothorax. IMPRESSION: No active disease.  Electronically Signed   By: Gerome Sam III M.D   On: 04/11/2021 17:25        Scheduled Meds: . ALPRAZolam  1 mg Oral BID  . ALPRAZolam  2 mg Oral QHS  . aspirin EC  81 mg Oral Daily  . atorvastatin  80 mg Oral q1800  . budesonide (PULMICORT) nebulizer solution  0.25 mg Nebulization BID  . buPROPion  150 mg Oral BID  . clopidogrel  75 mg Oral Q breakfast  . enoxaparin (LOVENOX) injection  40 mg Subcutaneous Q24H  . escitalopram  10 mg Oral q AM  . fenofibrate  160 mg Oral Daily  . fluticasone  2 spray Each Nare q AM  . insulin aspart  0-20 Units Subcutaneous TID WC  . insulin aspart  0-5 Units Subcutaneous QHS  . insulin aspart  20 Units Subcutaneous TID WC  . insulin glargine  52 Units Subcutaneous QHS  . insulin glargine  55 Units Subcutaneous Daily  . liraglutide  1.2 mg Subcutaneous QHS  . midodrine  5 mg Oral TID WC  . mupirocin cream   Topical Daily  . oxyCODONE  15 mg Oral 5 X Daily  . pantoprazole  80 mg Oral QAC breakfast  . pregabalin  150 mg Oral TID   Continuous Infusions: . ceFEPime (MAXIPIME) IV 2 g (04/12/21 0518)  . lactated ringers 100 mL/hr at 04/12/21 0851  . vancomycin 1,000 mg (04/12/21 0857)     LOS: 1 day    Time spent: 35 minutes    Lenoria Narine D Sherryll Burger, DO [facility]  If 7PM-7AM, please contact night-coverage [email or web address] 04/12/2021, 9:19 AM

## 2021-04-12 NOTE — Progress Notes (Signed)
Pharmacy Antibiotic Note  Joseph [name] is a 50 y.o. male admitted on 04/11/2021 with sepsis.  Pharmacy has been consulted for Vancomycin and Cefepime dosing.  Assessment/Plan: SCr improved 1.7 >> 1.44 Vancomycin 1000 IV every 8 hours.  Goal trough 10-15 mcg/mL. - check trough at steady state as warranted Cefepime 2gm IV q8hrs  Height: 6\' 5"  (195.6 cm) Weight: (!) 163.3 kg (360 lb 0.2 oz) IBW/kg (Calculated) : 89.1  Temp (24hrs), Avg:98.4 F (36.9 C), Min:97.7 F (36.5 C), Max:99.6 F (37.6 C)  Recent Labs  Lab 04/11/21 1744 04/11/21 1922 04/12/21 0459  WBC 12.4*  --  9.4  CREATININE 1.70*  --  1.44*  LATICACIDVEN 3.7* 2.4*  --     Estimated Creatinine Clearance: 105.4 mL/min (A) (by C-G formula based on SCr of 1.44 mg/dL (H)).    Allergies  Allergen Reactions  . Sulfa Antibiotics Other (See Comments)    Headaches     Antimicrobials this admission: Vancomycin 4/23  >>  Cefepime 4/23  >>   Dose adjustments this admission:   Microbiology results:  BCx:   UCx:    Sputum:    MRSA PCR:   Thank you for allowing pharmacy to be a part of this patient's care.  5/23 A 04/12/2021 9:19 AM

## 2021-04-12 NOTE — Discharge Instructions (Signed)
Get your primary care provider to go on the freestyle libre continuous glucose monitor's website and get the form for pre-authorization to continue to get the sensors.

## 2021-04-12 NOTE — Progress Notes (Signed)
Inpatient Diabetes Program Recommendations  AACE/ADA: New Consensus Statement on Inpatient Glycemic Control (2015)  Target Ranges:  Prepandial:   less than 140 mg/dL      Peak postprandial:   less than 180 mg/dL (1-2 hours)      Critically ill patients:  140 - 180 mg/dL   Lab Results  Component Value Date   GLUCAP 284 (H) 04/12/2021   HGBA1C 9.9 (H) 02/18/2021    Review of Glycemic Control Results for Joseph [name], Joseph [name] (MRN [id number]) as of 04/12/2021 13:45  Ref. Range 03/14/2021 06:02 03/14/2021 11:28 03/14/2021 16:01 04/11/2021 19:28 04/11/2021 22:01 04/12/2021 08:00 04/12/2021 11:24  Glucose-Capillary Latest Ref Range: 70 - 99 mg/dL 937 (H) 342 (H) 98 876 (H)  Novolog 8 units 265 (H)  Novolog 3 units  Lantus 52 units 282 (H)  Novolog 31 nits  Lantus 55 units 284 (H)  Novolog 31 units   Diabetes history: DM 2 Outpatient Diabetes medications: Lantus 46 units qam, 52 units qpm, metformin 1000 mg bid, Humalog 0-12 units tid, Victoza 1.2 mg Daily Current orders for Inpatient glycemic control:  Lantus 55 units qam, 52 units qpm Novolog 0-20 units tid +hs Novolog 20 units tid meal coverage  A1c 9.9% on 3/2 Pt admitted with Sepsis Left foot cellulitis  Spoke with pt on phone regarding A1c level and glucose control at home. Pt slow to respond over the phone but answered appropriately.  Pt follows with Marva Panda, NP for DM management pt said his A1C went from 13% to 6.7%. Discussed with pt current a1c level and the importance of glucose control. Explained to pt that we will monitor his glucose here and adjust insulins.   Pt had questions about getting medicare to cover his [facility] sensor. Told pt that a form can be downloaded and filled out for pre-authorization since he is on insulin. Follow up with his PCP.   Inpatient Diabetes Program Recommendations:    -  Increase Lantus to 60 units bid  Thanks,  Christena Deem RN, MSN, BC-ADM Inpatient Diabetes  Coordinator Team Pager [phone] (8a-5p)

## 2021-04-13 LAB — CBC
HCT: 39.4 % (ref 39.0–52.0)
Hemoglobin: 11.8 g/dL — ABNORMAL LOW (ref 13.0–17.0)
MCH: 25.4 pg — ABNORMAL LOW (ref 26.0–34.0)
MCHC: 29.9 g/dL — ABNORMAL LOW (ref 30.0–36.0)
MCV: 84.7 fL (ref 80.0–100.0)
Platelets: 204 10*3/uL (ref 150–400)
RBC: 4.65 MIL/uL (ref 4.22–5.81)
RDW: 15.4 % (ref 11.5–15.5)
WBC: 8.1 10*3/uL (ref 4.0–10.5)
nRBC: 0 % (ref 0.0–0.2)

## 2021-04-13 LAB — BASIC METABOLIC PANEL
Anion gap: 9 (ref 5–15)
BUN: 24 mg/dL — ABNORMAL HIGH (ref 6–20)
CO2: 22 mmol/L (ref 22–32)
Calcium: 8.7 mg/dL — ABNORMAL LOW (ref 8.9–10.3)
Chloride: 102 mmol/L (ref 98–111)
Creatinine, Ser: 1.5 mg/dL — ABNORMAL HIGH (ref 0.61–1.24)
GFR, Estimated: 57 mL/min — ABNORMAL LOW (ref >60)
Glucose, Bld: 231 mg/dL — ABNORMAL HIGH (ref 70–99)
Potassium: 4.7 mmol/L (ref 3.5–5.1)
Sodium: 133 mmol/L — ABNORMAL LOW (ref 135–145)

## 2021-04-13 LAB — GLUCOSE, CAPILLARY
Glucose-Capillary: 254 mg/dL — ABNORMAL HIGH (ref 70–99)
Glucose-Capillary: 257 mg/dL — ABNORMAL HIGH (ref 70–99)
Glucose-Capillary: 286 mg/dL — ABNORMAL HIGH (ref 70–99)
Glucose-Capillary: 162 mg/dL — ABNORMAL HIGH (ref 70–99)
Glucose-Capillary: 135 mg/dL — ABNORMAL HIGH (ref 70–99)

## 2021-04-13 LAB — CK: Total CK: 90 U/L (ref 49–397)

## 2021-04-13 LAB — LACTIC ACID, PLASMA: Lactic Acid, Venous: 1.3 mmol/L (ref 0.5–1.9)

## 2021-04-13 MED ORDER — ENOXAPARIN SODIUM 80 MG/0.8ML ~~LOC~~ SOLN
80.0000 mg | SUBCUTANEOUS | Status: DC
  Administered 2021-04-13: 80 mg via SUBCUTANEOUS
  Filled 2021-04-13: qty 0.8

## 2021-04-13 MED ORDER — SODIUM CHLORIDE 0.9 % IV SOLN
6.0000 mg/kg | Freq: Every day | INTRAVENOUS | Status: DC
  Administered 2021-04-13 – 2021-04-14 (×2): 1000 mg via INTRAVENOUS
  Filled 2021-04-13 (×3): qty 20

## 2021-04-13 MED ORDER — LIDOCAINE 5 % EX PTCH
1.0000 | MEDICATED_PATCH | CUTANEOUS | Status: DC
  Administered 2021-04-13 – 2021-04-14 (×2): 1 via TRANSDERMAL
  Filled 2021-04-13 (×2): qty 1

## 2021-04-13 MED ORDER — SODIUM CHLORIDE 0.9 % IV SOLN
INTRAVENOUS | Status: DC | PRN
  Administered 2021-04-13: 250 mL via INTRAVENOUS

## 2021-04-13 MED ORDER — ALBUTEROL SULFATE (2.5 MG/3ML) 0.083% IN NEBU: 2.5000 mg | INHALATION_SOLUTION | Freq: Four times a day (QID) | RESPIRATORY_TRACT | Status: DC | PRN

## 2021-04-13 NOTE — TOC Initial Note (Signed)
Transition of Care [facility]) - Initial/Assessment Note    Patient Details  Name: Joseph [name] MRN: [id number] Date of Birth: Apr 13, 1971  [facility] CM/SW Contact:    Elliot Gault, LCSW Phone Number: 04/13/2021, 10:38 AM  Clinical Narrative:                  Pt from home with wife. Per MD, pt will dc tomorrow with 10-14 days of IV antibiotic home therapy. Spoke with pt to discuss. Pt agreeable to this plan. Pt given information on CMS provider options and referred as requested. Per pt, his MIL is an CNA and can assist with administration of the medication.  Pam at [facility] will follow up with pt for teaching. Cory at [facility] accepted referral for [facility] RN.  Anticipating DC home tomorrow. TOC will follow.  Expected Discharge Plan: Home w Home Health Services Barriers to Discharge: Continued Medical Work up   Patient Goals and CMS Choice Patient states their goals for this hospitalization and ongoing recovery are:: go home CMS Medicare.gov Compare Post Acute Care list provided to:: Patient Choice offered to / list presented to : Patient  Expected Discharge Plan and Services Expected Discharge Plan: Home w Home Health Services In-house Referral: Clinical Social Work   Post Acute Care Choice: Home Health Living arrangements for the past 2 months: Single Family Home                 DME Arranged: IV pump/equipment   Date DME Agency Contacted: 04/13/21   Representative spoke with at DME Agency: Elita Quick HH Arranged: IV Antibiotics,RN HH Agency: [facility] Date [facility] Agency Contacted: 04/13/21   Representative spoke with at [facility] Agency: Kandee Keen  Prior Living Arrangements/Services Living arrangements for the past 2 months: Single Family Home Lives with:: Spouse Patient language and need for interpreter reviewed:: Yes Do you feel safe going back to the place where you live?: Yes      Need for Family Participation in Patient Care: Yes (Comment) Care  giver support system in place?: Yes (comment)   Criminal Activity/Legal Involvement Pertinent to Current Situation/Hospitalization: No - Comment as needed  Activities of Daily Living Home Assistive Devices/Equipment: None ADL Screening (condition at time of admission) Patient's cognitive ability adequate to safely complete daily activities?: Yes Is the patient deaf or have difficulty hearing?: No Does the patient have difficulty seeing, even when wearing glasses/contacts?: No Does the patient have difficulty concentrating, remembering, or making decisions?: No Patient able to express need for assistance with ADLs?: Yes Does the patient have difficulty dressing or bathing?: No Independently performs ADLs?: Yes (appropriate for developmental age) Does the patient have difficulty walking or climbing stairs?: Yes Weakness of Legs: Both Weakness of Arms/Hands: None  Permission Sought/Granted Permission sought to share information with : Oceanographer granted to share information with : Yes, Verbal Permission Granted     Permission granted to share info w AGENCY: [facility]fusion and HH        Emotional Assessment   Attitude/Demeanor/Rapport: Engaged Affect (typically observed): Pleasant Orientation: : Oriented to Self,Oriented to Place,Oriented to  Time,Oriented to Situation Alcohol / Substance Use: Not Applicable Psych Involvement: No (comment)  Admission diagnosis:  Cellulitis of left lower extremity [L03.116] Left leg cellulitis [L03.116] Poorly controlled type 2 diabetes mellitus (HCC) [E11.65] Chest pain, unspecified type [R07.9] Patient Active Problem List   Diagnosis Date Noted  . Left leg cellulitis 04/11/2021  . Cellulitis of left leg  03/05/2021  . Cellulitis 02/18/2021  . PAD (peripheral artery disease) (HCC) 10/10/2020  . Osteomyelitis of fourth toe of right foot (HCC)   . S/P CABG x 4 07/24/2020  . Non-ST elevation (NSTEMI) myocardial  infarction (HCC) 07/22/2020  . Acute on chronic diastolic heart failure (HCC) 07/22/2020  . Hypotension 07/18/2020  . Left leg swelling 07/18/2020  . Elevated troponin 07/18/2020  . AKI (acute kidney injury) (HCC) 07/18/2020  . Elevated brain natriuretic peptide (BNP) level 07/18/2020  . GERD (gastroesophageal reflux disease) 07/18/2020  . Severe Sepsis secondary to Lt leg/Foot Cellulitis 07/18/2020  . Severe sepsis --Severe Sepsis secondary to Lt leg/Foot Cellulitis with hypotension and AKI 07/18/2020  . Unstable angina (HCC) 11/01/2019  . Edema 06/13/2019  . Leukocytosis 05/31/2019  . Smoker 03/08/2019  . Chronic pain 03/08/2019  . Chronic back pain 02/16/2019  . Deep venous thrombosis (HCC) 02/16/2019  . Obesity, Class III, BMI 40-49.9 (morbid obesity) (HCC) 02/16/2019  . Peripheral nerve disease 02/16/2019  . COPD (chronic obstructive pulmonary disease) (HCC) 01/22/2019  . Anxiety disorder 03/26/2018  . Open wound of toe 10/12/2017  . Sinusitis 10/12/2017  . Abdominal pain   . Loss of weight   . Diabetic foot infection (HCC) 09/16/2017  . Uncontrolled type 2 diabetes mellitus with hyperglycemia, with long-term current use of insulin (HCC) 09/16/2017  . Cellulitis of right foot   . Chronic ulcer of great toe of right foot (HCC) 09/09/2017  . Recurrent chest pain 07/29/2016  . Hyperglycemia   . Insulin dependent type 2 diabetes mellitus (HCC) 04/29/2015  . OSA (obstructive sleep apnea) 05/08/2013  . History of pulmonary embolus (PE) 04/27/2013  . CAD -S/P PCI 11/02/19  09/10/2008  . Dyslipidemia 09/09/2008   PCP:  Marva Panda, NP Pharmacy:   [facility] [phone] - [place], [place] - [address] AT [facility] OF [address] [place] [address] Phone: [phone] Fax: [phone]  [id number] - [place], [place] - [address] AT [facility] OF [address] [name]  [address] Phone: [phone] Fax: [phone]     Social Determinants of Health (SDOH) Interventions    Readmission Risk Interventions No flowsheet data found.

## 2021-04-13 NOTE — Progress Notes (Signed)
PROGRESS NOTE    Joseph [name]  ZOX:[id number] DOB: [birth year] DOA: 04/11/2021 PCP: Marva Panda, NP   Brief Narrative:   Joseph [name] a 50 y.o.malewith a history of uncontrolled insulin-dependent type 2 diabetes, coronary artery disease with history ofNSTEMI and CABG x4 vessels and multiple stents, COPD, history of PE and DVT not currently on anticoagulation, peripheral artery disease on dual platelet therapy, recent hospitalization for left lower extremity cellulitis.  Patient has been readmitted with severe sepsis in the setting of recurrent left lower extremity cellulitis.  He will likely require PICC line placement with outpatient antibiotic infusions by tomorrow.  Assessment & Plan:   Principal Problem:   Severe sepsis --Severe Sepsis secondary to Lt leg/Foot Cellulitis with hypotension and AKI Active Problems:   History of pulmonary embolus (PE)   Uncontrolled type 2 diabetes mellitus with hyperglycemia, with long-term current use of insulin (HCC)   Chronic back pain   Obesity, Class III, BMI 40-49.9 (morbid obesity) (HCC)   Elevated troponin   AKI (acute kidney injury) (HCC)   Non-ST elevation (NSTEMI) myocardial infarction (HCC)   S/P CABG x 4   PAD (peripheral artery disease) (HCC)   Left leg cellulitis   Severe sepsis with recurrent left lower extremity cellulitis-improving -Continue on vancomycin and cefepime empirically -Follow blood cultures-full ID pending on one set -Lactic acid level is down trended -Continue IV fluid -Recent studies of PAD with no significant occlusions noted -Likely will require PICC line once blood cultures returned negative x48 hours and outpatient IV antibiotics due to difficulty in treating his infection, anticipate placement by am  Insulin-dependent type 2 diabetes with hyperglycemia -Increase mealtime insulin as well as long-acting insulin this morning and follow -Diabetes coordinator recommendations  appreciated -Hemoglobin A1c recently 9.9%  AKI on CKD stage II -Near his baseline creatinine of 1.3-1.4 -Appears to have resolved -Continue IV fluid per sepsis protocol and monitor  Troponin elevation -Stable and likely secondary to demand ischemia -Complains of musculoskeletal chest pain related to prior sternectomy -Continue on oxycodone as prescribed  PAD/CAD with history of NSTEMI and CABG -Continue dual antiplatelet therapy  History of hypotension -Continue midodrine -Labetalol added as needed for blood pressure elevations  History of chronic pain -Oxycodone scheduled 5 times daily -Continue the same and monitor  History of prior DVT/PE -Continue dual antiplatelet therapy for now along with Lovenox for DVT prophylaxis -Needs to have this addressed in the future with PCP regarding need for anticoagulation  Morbid obesity -BMI 42.69 -Lifestyle changes in outpatient setting  DVT prophylaxis: Lovenox Code Status: Full Family Communication: None at bedside, patient will call Disposition Plan:  Status is: Inpatient  Remains inpatient appropriate because:IV treatments appropriate due to intensity of illness or inability to take PO and Inpatient level of care appropriate due to severity of illness   Dispo: The patient is from: Home  Anticipated d/c is to: Home  Patient currently is not medically stable to d/c.              Difficult to place patient No   Consultants:   None  Procedures:   See below  Antimicrobials:  Anti-infectives (From admission, onward)   Start     Dose/Rate Route Frequency Ordered Stop   04/12/21 0200  vancomycin (VANCOCIN) IVPB 1000 mg/200 mL premix        1,000 mg 200 mL/hr over 60 Minutes Intravenous Every 8 hours 04/11/21 2109     04/11/21 2300  vancomycin (VANCOCIN) IVPB 1000 mg/200 mL premix  Status:  Discontinued        1,000 mg 200 mL/hr over 60 Minutes Intravenous  Once 04/11/21 2203  04/11/21 2211   04/11/21 2300  ceFEPIme (MAXIPIME) 2 g in sodium chloride 0.9 % 100 mL IVPB  Status:  Discontinued        2 g 200 mL/hr over 30 Minutes Intravenous  Once 04/11/21 2203 04/11/21 2211   04/11/21 2200  ceFEPIme (MAXIPIME) 2 g in sodium chloride 0.9 % 100 mL IVPB        2 g 200 mL/hr over 30 Minutes Intravenous Every 8 hours 04/11/21 2106     04/11/21 1730  piperacillin-tazobactam (ZOSYN) IVPB 3.375 g        3.375 g 100 mL/hr over 30 Minutes Intravenous  Once 04/11/21 1727 04/11/21 1835   04/11/21 1730  vancomycin (VANCOCIN) IVPB 1000 mg/200 mL premix        1,000 mg 200 mL/hr over 60 Minutes Intravenous  Once 04/11/21 1727 04/11/21 1947       Subjective: Patient seen and evaluated today with no new acute complaints or concerns. No acute concerns or events noted overnight.  His left lower extremity appears to be improving.  Objective: Vitals:   04/12/21 2121 04/12/21 2146 04/13/21 0601 04/13/21 0829  BP:  (!) 170/101 118/90   Pulse: 62 87 84   Resp: 20 20 (!) 22   Temp:  (!) 97.4 F (36.3 C) 98.9 F (37.2 C)   TempSrc:  Oral    SpO2: 99% 100% 100% 97%  Weight:      Height:        Intake/Output Summary (Last 24 hours) at 04/13/2021 0909 Last data filed at 04/12/2021 1900 Gross per 24 hour  Intake 1496.21 ml  Output --  Net 1496.21 ml   Filed Weights   04/11/21 1656 04/11/21 2152  Weight: (!) 169.6 kg (!) 163.3 kg    Examination:  General exam: Appears calm and comfortable, obese Respiratory system: Clear to auscultation. Respiratory effort normal. Cardiovascular system: S1 & S2 heard, RRR.  Gastrointestinal system: Abdomen is soft Central nervous system: Alert and awake Extremities: LLE erythema and edema improving Skin: As above Psychiatry: Flat affect.    Data Reviewed: I have personally reviewed following labs and imaging studies  CBC: Recent Labs  Lab 04/11/21 1744 04/12/21 0459 04/13/21 0731  WBC 12.4* 9.4 8.1  HGB 12.0* 12.1* 11.8*   HCT 39.0 39.4 39.4  MCV 83.5 84.5 84.7  PLT 223 210 204   Basic Metabolic Panel: Recent Labs  Lab 04/11/21 1744 04/12/21 0459 04/13/21 0731  NA 133* 138 133*  K 4.4 5.0 4.7  CL 100 105 102  CO2 22 24 22   GLUCOSE 396* 305* 231*  BUN 23* 22* 24*  CREATININE 1.70* 1.44* 1.50*  CALCIUM 9.2 9.2 8.7*   GFR: Estimated Creatinine Clearance: 101.2 mL/min (A) (by C-G formula based on SCr of 1.5 mg/dL (H)). Liver Function Tests: Recent Labs  Lab 04/11/21 1744  AST 21  ALT 33  ALKPHOS 85  BILITOT 0.6  PROT 6.8  ALBUMIN 3.2*   No results for input(s): LIPASE, AMYLASE in the last 168 hours. No results for input(s): AMMONIA in the last 168 hours. Coagulation Profile: No results for input(s): INR, PROTIME in the last 168 hours. Cardiac Enzymes: No results for input(s): CKTOTAL, CKMB, CKMBINDEX, TROPONINI in the last 168 hours. BNP (last 3 results) No results for input(s): PROBNP in the last 8760 hours. HbA1C: No results for input(s): HGBA1C  in the last 72 hours. CBG: Recent Labs  Lab 04/12/21 1124 04/12/21 1714 04/12/21 2143 04/13/21 0734 04/13/21 0759  GLUCAP 284* 79 153* 257* 254*   Lipid Profile: No results for input(s): CHOL, HDL, LDLCALC, TRIG, CHOLHDL, LDLDIRECT in the last 72 hours. Thyroid Function Tests: No results for input(s): TSH, T4TOTAL, FREET4, T3FREE, THYROIDAB in the last 72 hours. Anemia Panel: No results for input(s): VITAMINB12, FOLATE, FERRITIN, TIBC, IRON, RETICCTPCT in the last 72 hours. Sepsis Labs: Recent Labs  Lab 04/11/21 1922 04/12/21 0908 04/12/21 1214 04/13/21 0731  LATICACIDVEN 2.4* 1.8 2.0* 1.3    Recent Results (from the past 240 hour(s))  Culture, blood (routine x 2)     Status: None (Preliminary result)   Collection Time: 04/11/21  5:44 PM   Specimen: BLOOD RIGHT ARM  Result Value Ref Range Status   Specimen Description   Final    BLOOD RIGHT ARM Performed at [facility], [address] [place], [place] [address]     Special Requests   Final    BOTTLES DRAWN AEROBIC AND ANAEROBIC Blood Culture adequate volume Performed at [facility], [address] [place], [place] [address]    Culture  Setup Time   Final    GRAM POSITIVE COCCI ANAEROBIC BOTTLE ONLY Gram Stain Report Called to,Read Back By and Verified With: BENTSON @ 1310 ON [id number] BY HENDERSON L. CRITICAL RESULT CALLED TO, READ BACK BY AND VERIFIED WITH: Rennie NatterSPENCER GRAHAM RN @1933  04/12/21 EB    Culture   Final    GRAM POSITIVE COCCI IDENTIFICATION TO FOLLOW Performed at [facility], [address] [place], [place] [address]    Report Status PENDING  Incomplete  Culture, blood (routine x 2)     Status: None (Preliminary result)   Collection Time: 04/11/21  5:44 PM   Specimen: BLOOD RIGHT HAND  Result Value Ref Range Status   Specimen Description BLOOD RIGHT HAND  Final   Special Requests   Final    BOTTLES DRAWN AEROBIC AND ANAEROBIC Blood Culture adequate volume   Culture   Final    NO GROWTH < 24 HOURS Performed at [facility], [address] [place], [place] [address]    Report Status PENDING  Incomplete  Blood Culture ID Panel (Reflexed)     Status: Abnormal   Collection Time: 04/11/21  5:44 PM  Result Value Ref Range Status   Enterococcus faecalis NOT DETECTED NOT DETECTED Final   Enterococcus Faecium NOT DETECTED NOT DETECTED Final   Listeria monocytogenes NOT DETECTED NOT DETECTED Final   Staphylococcus species DETECTED (A) NOT DETECTED Final    Comment: CRITICAL RESULT CALLED TO, READ BACK BY AND VERIFIED WITH: SPENCER GRAHAM RN @1933  04/12/21 EB    Staphylococcus aureus (BCID) NOT DETECTED NOT DETECTED Final   Staphylococcus epidermidis NOT DETECTED NOT DETECTED Final   Staphylococcus lugdunensis NOT DETECTED NOT DETECTED Final   Streptococcus species NOT DETECTED NOT DETECTED Final   Streptococcus agalactiae NOT DETECTED NOT DETECTED Final   Streptococcus pneumoniae NOT DETECTED NOT DETECTED Final   Streptococcus  pyogenes NOT DETECTED NOT DETECTED Final   A.calcoaceticus-baumannii NOT DETECTED NOT DETECTED Final   Bacteroides fragilis NOT DETECTED NOT DETECTED Final   Enterobacterales NOT DETECTED NOT DETECTED Final   Enterobacter cloacae complex NOT DETECTED NOT DETECTED Final   Escherichia coli NOT DETECTED NOT DETECTED Final   Klebsiella aerogenes NOT DETECTED NOT DETECTED Final   Klebsiella oxytoca NOT DETECTED NOT DETECTED Final   Klebsiella pneumoniae NOT  DETECTED NOT DETECTED Final   Proteus species NOT DETECTED NOT DETECTED Final   Salmonella species NOT DETECTED NOT DETECTED Final   Serratia marcescens NOT DETECTED NOT DETECTED Final   Haemophilus influenzae NOT DETECTED NOT DETECTED Final   Neisseria meningitidis NOT DETECTED NOT DETECTED Final   Pseudomonas aeruginosa NOT DETECTED NOT DETECTED Final   Stenotrophomonas maltophilia NOT DETECTED NOT DETECTED Final   Candida albicans NOT DETECTED NOT DETECTED Final   Candida auris NOT DETECTED NOT DETECTED Final   Candida glabrata NOT DETECTED NOT DETECTED Final   Candida krusei NOT DETECTED NOT DETECTED Final   Candida parapsilosis NOT DETECTED NOT DETECTED Final   Candida tropicalis NOT DETECTED NOT DETECTED Final   Cryptococcus neoformans/gattii NOT DETECTED NOT DETECTED Final    Comment: Performed at [facility], [address], [place], [name] [address]  SARS CORONAVIRUS 2 (TAT 6-24 HRS) Nasopharyngeal Nasopharyngeal Swab     Status: None   Collection Time: 04/11/21  6:45 PM   Specimen: Nasopharyngeal Swab  Result Value Ref Range Status   SARS Coronavirus 2 NEGATIVE NEGATIVE Final    Comment: (NOTE) SARS-CoV-2 target nucleic acids are NOT DETECTED.  The SARS-CoV-2 RNA is generally detectable in upper and lower respiratory specimens during the acute phase of infection. Negative results do not preclude SARS-CoV-2 infection, do not rule out co-infections with other pathogens, and should not be used as the sole basis for  treatment or other patient management decisions. Negative results must be combined with clinical observations, patient history, and epidemiological information. The expected result is Negative.  Fact Sheet for Patients: HairSlick.no  Fact Sheet for Healthcare Providers: quierodirigir.com  This test is not yet approved or cleared by the [place] FDA and  has been authorized for detection and/or diagnosis of SARS-CoV-2 by FDA under an Emergency Use Authorization (EUA). This EUA will remain  in effect (meaning this test can be used) for the duration of the COVID-19 declaration under Se ction 564(b)(1) of the Act, 21 U.S.C. section 360bbb-3(b)(1), unless the authorization is terminated or revoked sooner.  Performed at [facility], [address] [place], [name] [address]          Radiology Studies: DG Chest Port 1 View  Result Date: 04/11/2021 CLINICAL DATA:  Chest pain. EXAM: PORTABLE CHEST 1 VIEW COMPARISON:  February 18, 2021 FINDINGS: Sternotomy wires are intact. The cardiomediastinal silhouette is stable. Evaluation of the lungs is limited due to the low volume portable technique but no focal infiltrate is identified. No pneumothorax. IMPRESSION: No active disease. Electronically Signed   By: Gerome Sam III M.D   On: 04/11/2021 17:25        Scheduled Meds: . ALPRAZolam  1 mg Oral BID  . ALPRAZolam  2 mg Oral QHS  . aspirin EC  81 mg Oral Daily  . atorvastatin  80 mg Oral q1800  . budesonide (PULMICORT) nebulizer solution  0.25 mg Nebulization BID  . buPROPion  150 mg Oral BID  . clopidogrel  75 mg Oral Q breakfast  . enoxaparin (LOVENOX) injection  80 mg Subcutaneous Q24H  . escitalopram  10 mg Oral q AM  . fenofibrate  160 mg Oral Daily  . fluticasone  2 spray Each Nare q AM  . insulin aspart  0-20 Units Subcutaneous TID WC  . insulin aspart  0-5 Units Subcutaneous QHS  . insulin aspart  20 Units  Subcutaneous TID WC  . insulin glargine  60 Units Subcutaneous QHS  .  insulin glargine  60 Units Subcutaneous Daily  . lidocaine  1 patch Transdermal Q24H  . liraglutide  1.2 mg Subcutaneous QHS  . midodrine  5 mg Oral TID WC  . mupirocin cream   Topical Daily  . oxyCODONE  15 mg Oral 5 X Daily  . pantoprazole  80 mg Oral QAC breakfast  . pregabalin  150 mg Oral TID   Continuous Infusions: . ceFEPime (MAXIPIME) IV 2 g (04/13/21 0533)  . vancomycin 1,000 mg (04/13/21 0852)     LOS: 2 days    Time spent: 35 minutes    Jakevion Arney D Sherryll Burger, DO [facility]  If 7PM-7AM, please contact night-coverage [email or web address] 04/13/2021, 9:09 AM

## 2021-04-13 NOTE — Progress Notes (Signed)
Pharmacy Antibiotic Note  Joseph [name] is a 50 y.o. male admitted on 04/11/2021 with sepsis.  Pharmacy has been consulted for Dapto and Cefepime dosing.  Assessment/Plan:  Daptomycin 1000 mg IV every 24 hours. Cefepime 2gm IV q8hrs Monitor labs, c/s, and patient improvement.  Height: 6\' 5"  (195.6 cm) Weight: (!) 163.3 kg (360 lb 0.2 oz) IBW/kg (Calculated) : 89.1  Temp (24hrs), Avg:97.9 F (36.6 C), Min:97.4 F (36.3 C), Max:98.9 F (37.2 C)  Recent Labs  Lab 04/11/21 1744 04/11/21 1922 04/12/21 0459 04/12/21 0908 04/12/21 1214 04/13/21 0731  WBC 12.4*  --  9.4  --   --  8.1  CREATININE 1.70*  --  1.44*  --   --  1.50*  LATICACIDVEN 3.7* 2.4*  --  1.8 2.0* 1.3    Estimated Creatinine Clearance: 101.2 mL/min (A) (by C-G formula based on SCr of 1.5 mg/dL (H)).    Allergies  Allergen Reactions  . Sulfa Antibiotics Other (See Comments)    Headaches     Antimicrobials this admission: Vancomycin 4/23  >> 4/25 Dapto 4/25 >> Cefepime 4/23  >>   Microbiology results:  BCx: gram + cocci 1 bottle- likely contaminant.   Thank you for allowing pharmacy to be a part of this patient's care.  5/23 04/13/2021 10:35 AM

## 2021-04-13 NOTE — Progress Notes (Signed)
Patient was taking a nap and was a little hard to arouse so I placed patient on CPAP due to extended periods of non-breathing.

## 2021-04-14 ENCOUNTER — Inpatient Hospital Stay: Payer: Self-pay

## 2021-04-14 LAB — CULTURE, BLOOD (ROUTINE X 2): Special Requests: ADEQUATE

## 2021-04-14 LAB — CBC
HCT: 37.5 % — ABNORMAL LOW (ref 39.0–52.0)
Hemoglobin: 11.2 g/dL — ABNORMAL LOW (ref 13.0–17.0)
MCH: 25.1 pg — ABNORMAL LOW (ref 26.0–34.0)
MCHC: 29.9 g/dL — ABNORMAL LOW (ref 30.0–36.0)
MCV: 84.1 fL (ref 80.0–100.0)
Platelets: 229 10*3/uL (ref 150–400)
RBC: 4.46 MIL/uL (ref 4.22–5.81)
RDW: 15.5 % (ref 11.5–15.5)
WBC: 7 10*3/uL (ref 4.0–10.5)
nRBC: 0 % (ref 0.0–0.2)

## 2021-04-14 LAB — BASIC METABOLIC PANEL
Anion gap: 9 (ref 5–15)
BUN: 27 mg/dL — ABNORMAL HIGH (ref 6–20)
CO2: 23 mmol/L (ref 22–32)
Calcium: 8.6 mg/dL — ABNORMAL LOW (ref 8.9–10.3)
Chloride: 103 mmol/L (ref 98–111)
Creatinine, Ser: 1.43 mg/dL — ABNORMAL HIGH (ref 0.61–1.24)
GFR, Estimated: >60 mL/min (ref >60)
Glucose, Bld: 182 mg/dL — ABNORMAL HIGH (ref 70–99)
Potassium: 4.5 mmol/L (ref 3.5–5.1)
Sodium: 135 mmol/L (ref 135–145)

## 2021-04-14 LAB — GLUCOSE, CAPILLARY
Glucose-Capillary: 212 mg/dL — ABNORMAL HIGH (ref 70–99)
Glucose-Capillary: 298 mg/dL — ABNORMAL HIGH (ref 70–99)
Glucose-Capillary: 106 mg/dL — ABNORMAL HIGH (ref 70–99)

## 2021-04-14 LAB — MAGNESIUM: Magnesium: 1.6 mg/dL — ABNORMAL LOW (ref 1.7–2.4)

## 2021-04-14 MED ORDER — CHLORHEXIDINE GLUCONATE CLOTH 2 % EX PADS
6.0000 | MEDICATED_PAD | Freq: Every day | CUTANEOUS | Status: DC
  Administered 2021-04-14: 6 via TOPICAL

## 2021-04-14 MED ORDER — SODIUM CHLORIDE 0.9% FLUSH
10.0000 mL | Freq: Two times a day (BID) | INTRAVENOUS | Status: DC
  Administered 2021-04-14: 10 mL

## 2021-04-14 MED ORDER — LIDOCAINE 5 % EX PTCH: 1.0000 | MEDICATED_PATCH | CUTANEOUS | 0 refills | Status: DC

## 2021-04-14 MED ORDER — MAGNESIUM SULFATE 2 GM/50ML IV SOLN
2.0000 g | Freq: Once | INTRAVENOUS | Status: AC
  Administered 2021-04-14: 2 g via INTRAVENOUS
  Filled 2021-04-14: qty 50

## 2021-04-14 MED ORDER — SODIUM CHLORIDE 0.9% FLUSH: 10.0000 mL | INTRAVENOUS | Status: DC | PRN

## 2021-04-14 MED ORDER — DAPTOMYCIN IV (FOR PTA / DISCHARGE USE ONLY)
1000.0000 mg | INTRAVENOUS | 0 refills | Status: AC
Start: 2021-04-15 — End: 2021-04-25

## 2021-04-14 NOTE — TOC Transition Note (Signed)
Transition of Care [facility]) - CM/SW Discharge Note   Patient Details  Name: Joseph [name] MRN: [id number] Date of Birth: [birth year]  [facility] CM/SW Contact:  Elliot Gault, LCSW Phone Number: 04/14/2021, 12:13 PM   Clinical Narrative:     Per MD, pt stable for dc home today. Spoke with pt and he confirms the dc plan to return home with Home IV anbx and [facility] RN. Pt updated that the IV antibiotics would be delivered to his home by [facility].   Updated [facility] at [facility] and Kandee Keen at [place] of pt's dc. No other TOC needs identified for dc.  Final next level of care: Home w Home Health Services Barriers to Discharge: Barriers Resolved   Patient Goals and CMS Choice Patient states their goals for this hospitalization and ongoing recovery are:: go home CMS Medicare.gov Compare Post Acute Care list provided to:: Patient Choice offered to / list presented to : Patient  Discharge Placement                       Discharge Plan and Services In-house Referral: Clinical Social Work   Post Acute Care Choice: Home Health          DME Arranged: IV pump/equipment   Date DME Agency Contacted: 04/13/21   Representative spoke with at DME Agency: Elita Quick HH Arranged: IV Antibiotics,RN HH Agency: [facility] Date [facility] Agency Contacted: 04/13/21   Representative spoke with at [facility] Agency: Kandee Keen  Social Determinants of Health (SDOH) Interventions     Readmission Risk Interventions No flowsheet data found.

## 2021-04-14 NOTE — Progress Notes (Signed)
PHARMACY CONSULT NOTE FOR:  OUTPATIENT  PARENTERAL ANTIBIOTIC THERAPY (OPAT)  Indication: cellulitis Regimen: daptomycin 1000 mg IV every 24 hours. End date: Last day of therapy 04/24/2021   IV antibiotic discharge orders are pended. To discharging provider:  please sign these orders via discharge navigator,  Select New Orders & click on the button choice - Manage This Unsigned Work.     Thank you for allowing pharmacy to be a part of this patient's care.  Tad Moore 04/14/2021, 10:48 AM

## 2021-04-14 NOTE — Progress Notes (Signed)
PICC line insertion completed, pt now sitting up in bed eating lunch. PICC Line flushed easily and has good blood return. Pt denies any c/o. VSS.

## 2021-04-14 NOTE — Progress Notes (Signed)
Peripherally Inserted Central Catheter Placement  The IV Nurse has discussed with the patient and/or persons authorized to consent for the patient, the purpose of this procedure and the potential benefits and risks involved with this procedure.  The benefits include less needle sticks, lab draws from the catheter, and the patient may be discharged home with the catheter. Risks include, but not limited to, infection, bleeding, blood clot (thrombus formation), and puncture of an artery; nerve damage and irregular heartbeat and possibility to perform a PICC exchange if needed/ordered by physician.  Alternatives to this procedure were also discussed.  Bard Power PICC patient education guide, fact sheet on infection prevention and patient information card has been provided to patient /or left at bedside.    PICC Placement Documentation  PICC Single Lumen (Ped) 04/14/21 PICC Right Other (Comment) 47 cm 0 cm (Active)  Indication for Insertion or Continuance of Line Prolonged intravenous therapies 04/14/21 1344  Exposed Catheter (cm) 0 cm 04/14/21 1344  Site Assessment Clean;Intact;Dry 04/14/21 1344  Line Status Flushed;Blood return noted 04/14/21 1344  Dressing Type Transparent 04/14/21 1344  Dressing Status Clean;Dry;Intact 04/14/21 1344  Antimicrobial disc in place? Yes 04/14/21 1344  Dressing Intervention New dressing;Other (Comment) 04/14/21 1344  Dressing Change Due 04/21/21 04/14/21 1344       Reginia Forts Albarece 04/14/2021, 1:45 PM

## 2021-04-14 NOTE — Progress Notes (Signed)
Pt discharged via WC to POV with all belongings in his possession. PICC line intact for home IV infusions.

## 2021-04-14 NOTE — Progress Notes (Signed)
Vascular team nurse called to state that she is on her way to place PICC line. Pt updated, states understanding.

## 2021-04-14 NOTE — Discharge Summary (Signed)
Physician Discharge Summary  Neema Fluegge [place] DJT:[id number] DOB: 21-Dec-1970 DOA: 04/11/2021  PCP: [name], NP  Admit date: 04/11/2021  Discharge date: 04/14/2021  Admitted From:Home  Disposition:  Home  Recommendations for Outpatient Follow-up:  1. Follow up with PCP in 1-2 weeks 2. Please obtain BMP/CBC in one week 3. Continue on daptomycin as prescribed to complete course of treatment for cellulitis through 5/6 4. PICC line placed with home health nursing care to assist with infusions  Home Health:Yes with RN for home infusions  Equipment/Devices:PICC  Discharge Condition:Stable  CODE STATUS: Full  Diet recommendation: Heart Healthy/Carb Modified  Brief/Interim Summary:  Eddison Searls Chriscois a 50 y.o.malewith a history of uncontrolled insulin-dependent type 2 diabetes, coronary artery disease with history ofNSTEMI and CABG x4 vessels and multiple stents, COPD, history of PE and DVT not currently on anticoagulation, peripheral artery disease on dual platelet therapy, recent hospitalization for left lower extremity cellulitis.Patient has been readmitted with severe sepsis in the setting of recurrent left lower extremity cellulitis.  He has improved considerably after use of IV antibiotics to include cefepime and eventually vancomycin which was then changed to daptomycin.  He is in stable condition for discharge to continue a total antibiotic course of 14 days with daptomycin with PICC line and home IV infusions.  No other acute events or concerns noted during the course of his hospitalization.  He was noted to have blood culture contaminant noted in 1 set with staph warneri.  Discharge Diagnoses:  Principal Problem:   Severe sepsis --Severe Sepsis secondary to Lt leg/Foot Cellulitis with hypotension and AKI Active Problems:   History of pulmonary embolus (PE)   Uncontrolled type 2 diabetes mellitus with hyperglycemia, with long-term current use of insulin (HCC)    Chronic back pain   Obesity, Class III, BMI 40-49.9 (morbid obesity) (HCC)   Elevated troponin   AKI (acute kidney injury) ([place])   Non-ST elevation (NSTEMI) myocardial infarction (HCC)   S/P CABG x 4   PAD (peripheral artery disease) (HCC)   Left leg cellulitis  Principal discharge diagnosis: Severe sepsis present on admission secondary to recurrent left lower extremity cellulitis.  Discharge Instructions  Discharge Instructions    [facility] pharmacist to adjust dose for Vancomycin, Aminoglycosides and other anti-infective therapies as requested by physician.   Complete by: As directed    [facility] to provide Cath Flo 2mg    Complete by: As directed    Administer for PICC line occlusion and as ordered by physician for other access device issues.   Anaphylaxis Kit: Provided to treat any anaphylactic reaction to the medication being provided to the patient if First Dose or when requested by physician   Complete by: As directed    Epinephrine 1mg /ml vial / amp: Administer 0.3mg  (0.49ml) subcutaneously once for moderate to severe anaphylaxis, nurse to call physician and pharmacy when reaction occurs and call 911 if needed for immediate care   Diphenhydramine 50mg /ml IV vial: Administer 25-50mg  IV/IM PRN for first dose reaction, rash, itching, mild reaction, nurse to call physician and pharmacy when reaction occurs   Sodium Chloride 0.9% NS 580ml IV: Administer if needed for hypovolemic blood pressure drop or as ordered by physician after call to physician with anaphylactic reaction   Change dressing on IV access line weekly and PRN   Complete by: As directed    Diet - low sodium heart healthy   Complete by: As directed    Flush IV access with Sodium Chloride 0.9% and  Heparin 10 units/ml or 100 units/ml   Complete by: As directed    Home infusion instructions - [facility]   Complete by: As directed    Instructions: Flush IV access with Sodium Chloride  0.9% and Heparin 10units/ml or 100units/ml   Change dressing on IV access line: Weekly and PRN   Instructions Cath Flo $Remove'2mg'DEuALeA$ : Administer for PICC Line occlusion and as ordered by physician for other access device   [facility] pharmacist to adjust dose for: Vancomycin, Aminoglycosides and other anti-infective therapies as requested by physician   Increase activity slowly   Complete by: As directed    Method of administration may be changed at the discretion of home infusion pharmacist based upon assessment of the patient and/or caregiver's ability to self-administer the medication ordered   Complete by: As directed      Allergies as of 04/14/2021      Reactions   Sulfa Antibiotics Other (See Comments)   Headaches      Medication List    TAKE these medications   albuterol 108 (90 Base) MCG/ACT inhaler Commonly known as: VENTOLIN HFA Inhale 2 puffs into the lungs every 6 (six) hours as needed for wheezing or shortness of breath.   ALPRAZolam 1 MG tablet Commonly known as: XANAX Take 1 mg by mouth See admin instructions. 1 tablet by mouth twice daily and 2 tabs at bedtime.   aspirin EC 81 MG tablet Take 1 tablet (81 mg total) by mouth daily. Swallow whole.   atorvastatin 80 MG tablet Commonly known as: LIPITOR Take 1 tablet (80 mg total) by mouth daily at 6 PM.   blood glucose meter kit and supplies Kit Dispense based on patient and insurance preference. Use up to four times daily as directed. (FOR ICD-9 250.00, 250.01).   buPROPion 150 MG 12 hr tablet Commonly known as: WELLBUTRIN SR Take 150 mg by mouth 2 (two) times daily.   clopidogrel 75 MG tablet Commonly known as: PLAVIX Take 1 tablet (75 mg total) by mouth daily with breakfast.   daptomycin  IVPB Commonly known as: CUBICIN Inject 1,000 mg into the vein daily for 10 days. Indication:  Cellulitis First Dose: Yes Last Day of Therapy:  04/24/2021 Labs - Once weekly:  CBC/D, BMP, and CPK Labs - Every other  week:  ESR and CRP Method of administration: IV Push Method of administration may be changed at the discretion of home infusion pharmacist based upon assessment of the patient and/or caregiver's ability to self-administer the medication ordered. Start taking on: April 15, 2021   escitalopram 10 MG tablet Commonly known as: LEXAPRO Take 10 mg by mouth in the morning.   fenofibrate 145 MG tablet Commonly known as: TRICOR Take 145 mg by mouth in the morning.   Flovent Diskus 250 MCG/BLIST Aepb Generic drug: Fluticasone Propionate (Inhal) Inhale 2 puffs into the lungs in the morning and at bedtime.   fluconazole 150 MG tablet Commonly known as: DIFLUCAN Take 150 mg by mouth every Monday.   fluticasone 50 MCG/ACT nasal spray Commonly known as: FLONASE Place 2 sprays into both nostrils in the morning.   FreeStyle Libre 14 Day Sensor Misc Inject 1 patch into the skin every 14 (fourteen) days.   furosemide 40 MG tablet Commonly known as: LASIX Take 1 tablet (40 mg total) by mouth daily.   insulin lispro 100 UNIT/ML KwikPen Commonly known as: HumaLOG KwikPen Inject 12 Units into the skin 3 (three) times daily. What changed:  how much to take  additional instructions   Lantus SoloStar 100 UNIT/ML Solostar Pen Generic drug: insulin glargine Inject 46 Units into the skin daily before breakfast AND 52 Units at bedtime.   lidocaine 5 % Commonly known as: LIDODERM Place 1 patch onto the skin daily. Remove & Discard patch within 12 hours or as directed by MD Start taking on: April 15, 2021   meclizine 25 MG tablet Commonly known as: ANTIVERT Take 25 mg by mouth 3 (three) times daily as needed for dizziness. Take 50 mg by mouth in the morning & 25 mg by mouth at night   metFORMIN 1000 MG tablet Commonly known as: GLUCOPHAGE Take 1 tablet (1,000 mg total) by mouth 2 (two) times daily with a meal.   midodrine 5 MG tablet Commonly known as: PROAMATINE Take 5 mg by mouth 3  (three) times daily with meals.   mupirocin cream 2 % Commonly known as: BACTROBAN Apply topically daily.   naloxone 4 MG/0.1ML Liqd nasal spray kit Commonly known as: NARCAN Place 4 mg into the nose as needed (overdose).   nitroGLYCERIN 0.4 MG SL tablet Commonly known as: NITROSTAT Place 0.4 mg under the tongue every 5 (five) minutes as needed for chest pain.   oxyCODONE 15 MG immediate release tablet Commonly known as: ROXICODONE Take 1 tablet (15 mg total) by mouth 5 (five) times daily.   pantoprazole 40 MG tablet Commonly known as: PROTONIX Take 1 tablet (40 mg total) by mouth daily. What changed:   how much to take  when to take this   pregabalin 150 MG capsule Commonly known as: LYRICA Take 150 mg by mouth 3 (three) times daily.   promethazine 25 MG tablet Commonly known as: PHENERGAN Take 25 mg by mouth every 4 (four) hours as needed for nausea or vomiting.   Victoza 18 MG/3ML Sopn Generic drug: liraglutide Inject 1.2 mg into the skin at bedtime.            Discharge Care Instructions  (From admission, onward)         Start     Ordered   04/14/21 0000  Change dressing on IV access line weekly and PRN  (Home infusion instructions - [facility] )        04/14/21 1136          Follow-up Information    [facility] [place] Follow up.   Specialty: [place] Why: [facility] staff will call you to schedule nursing visits.  [facility] will be supplying your IV medication and equipment needs. Contact information: [place] [phone]              Allergies  Allergen Reactions  . Sulfa Antibiotics Other (See Comments)    Headaches     Consultations:  None   Procedures/Studies: DG Chest Port 1 View  Result Date: 04/11/2021 CLINICAL DATA:  Chest pain. EXAM: PORTABLE CHEST 1 VIEW COMPARISON:  February 18, 2021 FINDINGS: Sternotomy wires are intact. The  cardiomediastinal silhouette is stable. Evaluation of the lungs is limited due to the low volume portable technique but no focal infiltrate is identified. No pneumothorax. IMPRESSION: No active disease. Electronically Signed   By: Dorise Bullion III M.D   On: 04/11/2021 17:25   [place] EKG SITE RITE  Result Date: 04/14/2021 If Site Rite image not attached, placement could not be confirmed due to current cardiac rhythm.     Discharge  Exam: Vitals:   04/14/21 0531 04/14/21 0758  BP: 127/83   Pulse: 79   Resp: 20   Temp: (!) 97.5 F (36.4 C)   SpO2: 97% 97%   Vitals:   04/13/21 1946 04/13/21 2156 04/14/21 0531 04/14/21 0758  BP:  (!) 150/96 127/83   Pulse: 80 76 79   Resp: 16 20 20    Temp:  98.1 F (36.7 C) (!) 97.5 F (36.4 C)   TempSrc:      SpO2: 98% 98% 97% 97%  Weight:      Height:        General: Pt is alert, awake, not in acute distress, obese Cardiovascular: RRR, S1/S2 +, no rubs, no gallops Respiratory: CTA bilaterally, no wheezing, no rhonchi Abdominal: Soft, NT, ND, bowel sounds + Extremities: Left lower extremity erythema and swelling greatly improved    The results of significant diagnostics from this hospitalization (including imaging, microbiology, ancillary and laboratory) are listed below for reference.     Microbiology: Recent Results (from the past 240 hour(s))  Culture, blood (routine x 2)     Status: Abnormal   Collection Time: 04/11/21  5:44 PM   Specimen: BLOOD RIGHT ARM  Result Value Ref Range Status   Specimen Description   Final    BLOOD RIGHT ARM Performed at [facility], [address] [place], [name]    Special Requests   Final    BOTTLES DRAWN AEROBIC AND ANAEROBIC Blood Culture adequate volume Performed at [facility], [address] [place], [name]    Culture  Setup Time   Final    GRAM POSITIVE COCCI ANAEROBIC BOTTLE ONLY Gram Stain Report Called to,Read Back By and Verified With: BENTSON @ 1310 ON 73532 BY  HENDERSON L. CRITICAL RESULT CALLED TO, READ BACK BY AND VERIFIED WITH: SPENCER GRAHAM RN @1933  04/12/21 EB    Culture (A)  Final    STAPHYLOCOCCUS WARNERI THE SIGNIFICANCE OF ISOLATING THIS ORGANISM FROM A SINGLE SET OF BLOOD CULTURES WHEN MULTIPLE SETS ARE DRAWN IS UNCERTAIN. PLEASE NOTIFY THE MICROBIOLOGY DEPARTMENT WITHIN ONE WEEK IF SPECIATION AND SENSITIVITIES ARE REQUIRED. Performed at [facility], [address] [place], [address] [place]    Report Status 04/14/2021 FINAL  Final  Culture, blood (routine x 2)     Status: None (Preliminary result)   Collection Time: 04/11/21  5:44 PM   Specimen: BLOOD RIGHT HAND  Result Value Ref Range Status   Specimen Description BLOOD RIGHT HAND  Final   Special Requests   Final    BOTTLES DRAWN AEROBIC AND ANAEROBIC Blood Culture adequate volume   Culture   Final    NO GROWTH 3 DAYS Performed at [facility], [address] [place], [address] [place]    Report Status PENDING  Incomplete  Blood Culture ID Panel (Reflexed)     Status: Abnormal   Collection Time: 04/11/21  5:44 PM  Result Value Ref Range Status   Enterococcus faecalis NOT DETECTED NOT DETECTED Final   Enterococcus Faecium NOT DETECTED NOT DETECTED Final   Listeria monocytogenes NOT DETECTED NOT DETECTED Final   Staphylococcus species DETECTED (A) NOT DETECTED Final    Comment: CRITICAL RESULT CALLED TO, READ BACK BY AND VERIFIED WITH: [id number] RN @1933  04/12/21 EB    Staphylococcus aureus (BCID) NOT DETECTED NOT DETECTED Final   Staphylococcus epidermidis NOT DETECTED NOT DETECTED Final   Staphylococcus lugdunensis NOT DETECTED NOT DETECTED Final   Streptococcus species NOT DETECTED NOT  DETECTED Final   Streptococcus agalactiae NOT DETECTED NOT DETECTED Final   Streptococcus pneumoniae NOT DETECTED NOT DETECTED Final   Streptococcus pyogenes NOT DETECTED NOT DETECTED Final   A.calcoaceticus-baumannii NOT DETECTED NOT DETECTED Final   Bacteroides fragilis NOT  DETECTED NOT DETECTED Final   Enterobacterales NOT DETECTED NOT DETECTED Final   Enterobacter cloacae complex NOT DETECTED NOT DETECTED Final   Escherichia coli NOT DETECTED NOT DETECTED Final   Klebsiella aerogenes NOT DETECTED NOT DETECTED Final   Klebsiella oxytoca NOT DETECTED NOT DETECTED Final   Klebsiella pneumoniae NOT DETECTED NOT DETECTED Final   Proteus species NOT DETECTED NOT DETECTED Final   Salmonella species NOT DETECTED NOT DETECTED Final   Serratia marcescens NOT DETECTED NOT DETECTED Final   Haemophilus influenzae NOT DETECTED NOT DETECTED Final   Neisseria meningitidis NOT DETECTED NOT DETECTED Final   Pseudomonas aeruginosa NOT DETECTED NOT DETECTED Final   Stenotrophomonas maltophilia NOT DETECTED NOT DETECTED Final   Candida albicans NOT DETECTED NOT DETECTED Final   Candida auris NOT DETECTED NOT DETECTED Final   Candida glabrata NOT DETECTED NOT DETECTED Final   Candida krusei NOT DETECTED NOT DETECTED Final   Candida parapsilosis NOT DETECTED NOT DETECTED Final   Candida tropicalis NOT DETECTED NOT DETECTED Final   Cryptococcus neoformans/gattii NOT DETECTED NOT DETECTED Final    Comment: Performed at [facility], [address], [place], [place] [address]  SARS CORONAVIRUS 2 (TAT 6-24 HRS) Nasopharyngeal Nasopharyngeal Swab     Status: None   Collection Time: 04/11/21  6:45 PM   Specimen: Nasopharyngeal Swab  Result Value Ref Range Status   SARS Coronavirus 2 NEGATIVE NEGATIVE Final    Comment: (NOTE) SARS-CoV-2 target nucleic acids are NOT DETECTED.  The SARS-CoV-2 RNA is generally detectable in upper and lower respiratory specimens during the acute phase of infection. Negative results do not preclude SARS-CoV-2 infection, do not rule out co-infections with other pathogens, and should not be used as the sole basis for treatment or other patient management decisions. Negative results must be combined with clinical observations, patient history,  and epidemiological information. The expected result is Negative.  Fact Sheet for Patients: SugarRoll.be  Fact Sheet for Healthcare Providers: https://www.woods-mathews.com/  This test is not yet approved or cleared by the [place] FDA and  has been authorized for detection and/or diagnosis of SARS-CoV-2 by FDA under an Emergency Use Authorization (EUA). This EUA will remain  in effect (meaning this test can be used) for the duration of the COVID-19 declaration under Se ction 564(b)(1) of the Act, 21 U.S.C. section 360bbb-3(b)(1), unless the authorization is terminated or revoked sooner.  Performed at [facility], [place] [address] [place], [place] [address]      Labs: BNP (last 3 results) Recent Labs    07/18/20 0100 07/27/20 0331 11/20/20 1253  BNP 199.0* 271.0* 29.4   Basic Metabolic Panel: Recent Labs  Lab 04/11/21 1744 04/12/21 0459 04/13/21 0731 04/14/21 0522  NA 133* 138 133* 135  K 4.4 5.0 4.7 4.5  CL 100 105 102 103  CO2 $Re'22 24 22 23  'dUu$ GLUCOSE 396* 305* 231* 182*  BUN 23* 22* 24* 27*  CREATININE 1.70* 1.44* 1.50* 1.43*  CALCIUM 9.2 9.2 8.7* 8.6*  MG  --   --   --  1.6*   Liver Function Tests: Recent Labs  Lab 04/11/21 1744  AST 21  ALT 33  ALKPHOS 85  BILITOT 0.6  PROT 6.8  ALBUMIN 3.2*  No results for input(s): LIPASE, AMYLASE in the last 168 hours. No results for input(s): AMMONIA in the last 168 hours. CBC: Recent Labs  Lab 04/11/21 1744 04/12/21 0459 04/13/21 0731 04/14/21 0522  WBC 12.4* 9.4 8.1 7.0  HGB 12.0* 12.1* 11.8* 11.2*  HCT 39.0 39.4 39.4 37.5*  MCV 83.5 84.5 84.7 84.1  PLT 223 210 204 229   Cardiac Enzymes: Recent Labs  Lab 04/13/21 1049  CKTOTAL 90   BNP: Invalid input(s): POCBNP CBG: Recent Labs  Lab 04/13/21 1105 04/13/21 1625 04/13/21 2013 04/14/21 0730 04/14/21 1124  GLUCAP 286* 162* 135* 212* 298*   D-Dimer No results for input(s): DDIMER in the  last 72 hours. Hgb A1c No results for input(s): HGBA1C in the last 72 hours. Lipid Profile No results for input(s): CHOL, HDL, LDLCALC, TRIG, CHOLHDL, LDLDIRECT in the last 72 hours. Thyroid function studies No results for input(s): TSH, T4TOTAL, T3FREE, THYROIDAB in the last 72 hours.  Invalid input(s): FREET3 Anemia work up No results for input(s): VITAMINB12, FOLATE, FERRITIN, TIBC, IRON, RETICCTPCT in the last 72 hours. Urinalysis    Component Value Date/Time   COLORURINE YELLOW 02/18/2021 1900   APPEARANCEUR CLEAR 02/18/2021 1900   LABSPEC 1.014 02/18/2021 1900   PHURINE 5.0 02/18/2021 1900   GLUCOSEU 150 (A) 02/18/2021 1900   HGBUR NEGATIVE 02/18/2021 1900   BILIRUBINUR NEGATIVE 02/18/2021 1900   KETONESUR NEGATIVE 02/18/2021 1900   PROTEINUR 30 (A) 02/18/2021 1900   UROBILINOGEN 0.2 10/27/2007 1550   NITRITE NEGATIVE 02/18/2021 1900   LEUKOCYTESUR NEGATIVE 02/18/2021 1900   Sepsis Labs Invalid input(s): PROCALCITONIN,  WBC,  LACTICIDVEN Microbiology Recent Results (from the past 240 hour(s))  Culture, blood (routine x 2)     Status: Abnormal   Collection Time: 04/11/21  5:44 PM   Specimen: BLOOD RIGHT ARM  Result Value Ref Range Status   Specimen Description   Final    BLOOD RIGHT ARM Performed at [facility], [address] [place], [place] [address]    Special Requests   Final    BOTTLES DRAWN AEROBIC AND ANAEROBIC Blood Culture adequate volume Performed at [facility], [address] [place], [place] [address]    Culture  Setup Time   Final    GRAM POSITIVE COCCI ANAEROBIC BOTTLE ONLY Gram Stain Report Called to,Read Back By and Verified With: BENTSON @ 63 ON [id number] BY HENDERSON L. CRITICAL RESULT CALLED TO, READ BACK BY AND VERIFIED WITH: [place] RN $RemoveBefore'@1933'[email or web address]$  04/12/21 EB    Culture (A)  Final    STAPHYLOCOCCUS WARNERI THE SIGNIFICANCE OF ISOLATING THIS ORGANISM FROM A SINGLE SET OF BLOOD CULTURES WHEN MULTIPLE SETS ARE DRAWN IS UNCERTAIN. PLEASE  NOTIFY THE MICROBIOLOGY DEPARTMENT WITHIN ONE WEEK IF SPECIATION AND SENSITIVITIES ARE REQUIRED. Performed at [facility], [place] [address] [place], [place] [address]    Report Status 04/14/2021 FINAL  Final  Culture, blood (routine x 2)     Status: None (Preliminary result)   Collection Time: 04/11/21  5:44 PM   Specimen: BLOOD RIGHT HAND  Result Value Ref Range Status   Specimen Description BLOOD RIGHT HAND  Final   Special Requests   Final    BOTTLES DRAWN AEROBIC AND ANAEROBIC Blood Culture adequate volume   Culture   Final    NO GROWTH 3 DAYS Performed at [facility], [address], [place], [place] [address]    Report Status PENDING  Incomplete  Blood Culture ID Panel (Reflexed)     Status:  Abnormal   Collection Time: 04/11/21  5:44 PM  Result Value Ref Range Status   Enterococcus faecalis NOT DETECTED NOT DETECTED Final   Enterococcus Faecium NOT DETECTED NOT DETECTED Final   Listeria monocytogenes NOT DETECTED NOT DETECTED Final   Staphylococcus species DETECTED (A) NOT DETECTED Final    Comment: CRITICAL RESULT CALLED TO, READ BACK BY AND VERIFIED WITH: SPENCER GRAHAM RN $RemoveB'@1933'[email or web address]$  04/12/21 EB    Staphylococcus aureus (BCID) NOT DETECTED NOT DETECTED Final   Staphylococcus epidermidis NOT DETECTED NOT DETECTED Final   Staphylococcus lugdunensis NOT DETECTED NOT DETECTED Final   Streptococcus species NOT DETECTED NOT DETECTED Final   Streptococcus agalactiae NOT DETECTED NOT DETECTED Final   Streptococcus pneumoniae NOT DETECTED NOT DETECTED Final   Streptococcus pyogenes NOT DETECTED NOT DETECTED Final   A.calcoaceticus-baumannii NOT DETECTED NOT DETECTED Final   Bacteroides fragilis NOT DETECTED NOT DETECTED Final   Enterobacterales NOT DETECTED NOT DETECTED Final   Enterobacter cloacae complex NOT DETECTED NOT DETECTED Final   Escherichia coli NOT DETECTED NOT DETECTED Final   Klebsiella aerogenes NOT DETECTED NOT DETECTED Final   Klebsiella oxytoca NOT DETECTED NOT  DETECTED Final   Klebsiella pneumoniae NOT DETECTED NOT DETECTED Final   Proteus species NOT DETECTED NOT DETECTED Final   Salmonella species NOT DETECTED NOT DETECTED Final   Serratia marcescens NOT DETECTED NOT DETECTED Final   Haemophilus influenzae NOT DETECTED NOT DETECTED Final   Neisseria meningitidis NOT DETECTED NOT DETECTED Final   Pseudomonas aeruginosa NOT DETECTED NOT DETECTED Final   Stenotrophomonas maltophilia NOT DETECTED NOT DETECTED Final   Candida albicans NOT DETECTED NOT DETECTED Final   Candida auris NOT DETECTED NOT DETECTED Final   Candida glabrata NOT DETECTED NOT DETECTED Final   Candida krusei NOT DETECTED NOT DETECTED Final   Candida parapsilosis NOT DETECTED NOT DETECTED Final   Candida tropicalis NOT DETECTED NOT DETECTED Final   Cryptococcus neoformans/gattii NOT DETECTED NOT DETECTED Final    Comment: Performed at [facility], [address] [place], [place] [address]  SARS CORONAVIRUS 2 (TAT 6-24 HRS) Nasopharyngeal Nasopharyngeal Swab     Status: None   Collection Time: 04/11/21  6:45 PM   Specimen: Nasopharyngeal Swab  Result Value Ref Range Status   SARS Coronavirus 2 NEGATIVE NEGATIVE Final    Comment: (NOTE) SARS-CoV-2 target nucleic acids are NOT DETECTED.  The SARS-CoV-2 RNA is generally detectable in upper and lower respiratory specimens during the acute phase of infection. Negative results do not preclude SARS-CoV-2 infection, do not rule out co-infections with other pathogens, and should not be used as the sole basis for treatment or other patient management decisions. Negative results must be combined with clinical observations, patient history, and epidemiological information. The expected result is Negative.  Fact Sheet for Patients: SugarRoll.be  Fact Sheet for Healthcare Providers: https://www.woods-mathews.com/  This test is not yet approved or cleared by the [place] FDA and   has been authorized for detection and/or diagnosis of SARS-CoV-2 by FDA under an Emergency Use Authorization (EUA). This EUA will remain  in effect (meaning this test can be used) for the duration of the COVID-19 declaration under Se ction 564(b)(1) of the Act, 21 U.S.C. section 360bbb-3(b)(1), unless the authorization is terminated or revoked sooner.  Performed at [facility], [place] [address] [place], [place] [address]      Time coordinating discharge: 35 minutes  SIGNED:   Rodena Goldmann, DO [facility] 04/14/2021, 11:36 AM  If 7PM-7AM,  please contact night-coverage [email or web address]

## 2021-04-14 NOTE — Care Management Important Message (Signed)
Important Message  Patient Details  Name: Joseph [name] MRN: [id number] Date of Birth: July 16, 1971   Medicare Important Message Given:  Yes     Corey Harold 04/14/2021, 12:32 PM

## 2021-04-16 LAB — CULTURE, BLOOD (ROUTINE X 2)
Culture: NO GROWTH
Special Requests: ADEQUATE

## 2021-04-19 LAB — CULTURE, BLOOD (ROUTINE X 2)
Culture: NO GROWTH
Culture: NO GROWTH
Special Requests: ADEQUATE
Special Requests: ADEQUATE

## 2021-04-23 ENCOUNTER — Ambulatory Visit: Payer: Medicare HMO | Admitting: Orthopedic Surgery

## 2021-04-23 ENCOUNTER — Telehealth: Payer: Self-pay

## 2021-04-23 NOTE — Telephone Encounter (Signed)
Corrie Dandy, nurse from [facility] infusion clinic called stating that patient has an appt this afternoon with Dr [name]. She is requesting a call back to discuss his care after appointment. She stated that patient started IV abx last week 04/27-05/06 daptomycin.  She states Frances Furbish tried to get to patients home multiple times to check his bandage/wound but was unable to get to patient.  She stated [place] and there is a gate that anyone who comes has to enter through and they were having a hard time to get anyone to come to the gate. They also tried another agency to see if they could care for patient but they showed up at patients home yesterday and waited for over 30 minutes for someone to let them in and no one ever came. She would like call back on whether or not they should try to proceed with IV abx or does PICC line need to be pulled? She stated it seemed that patient had taken his care into his own hands and that he was doing whatever he wanted. She is asking for clarification. Please call her after patients appointment to further advise. She will be there until 6pm [phone]

## 2021-04-23 NOTE — Telephone Encounter (Signed)
Pt cx appt for today and resch for tomorrow. Will hold this message and address after appt.

## 2021-04-24 ENCOUNTER — Ambulatory Visit: Payer: Medicare HMO | Admitting: Physician Assistant

## 2021-04-24 NOTE — Telephone Encounter (Signed)
PA sw [facility] to advise that this pt is f/u with Dr. Ilsa Iha and that we did not see pt for this and that he has not been in the office for several months the PICC line will be addressed with ID

## 2021-04-27 ENCOUNTER — Telehealth: Payer: Self-pay

## 2021-04-27 NOTE — Telephone Encounter (Signed)
RN spoke with [facility], patient actually finished IV antibiotics on 04/25/21. RN spoke with Dr. Drue Second who advises that okay to pull PICC. Relayed verbal orders to Tim at [facility] that okay to pull PICC. Orders repeated and verified. [facility] states they will let us know if they are unable to get in touch with patient.   Sandie Ano, RN

## 2021-04-27 NOTE — Telephone Encounter (Signed)
Patient was able to return missed call from CMA. Patient understands that home health will be coming out to pull his picc line this week. Requested he call them today to set up an appointment with them. Understands that this is not something he can postpone.  Will call [facility] RN today.  Juanita Laster, RMA

## 2021-04-27 NOTE — Telephone Encounter (Signed)
Received call from [facility] at [facility]. She states two different nursing agencies have attempted to work with the patient but have been unable to see the patient. Corrie Dandy states [place] and has not opened the gate for nursing staff. No labs have been done as far as [facility] knows. CMA attempted to call patient, but was unable to reach and left voicemail requesting call back.   Per [facility], patient is scheduled to end daptomycin on 05/01/21, but they do not have any pull PICC orders. Patient is not scheduled to follow up with Dr. Drue Second until 05/06/21. Will route to provider.   [facility] has contacted patient's PCP and ortho for orders and have been unsuccessful.   Sandie Ano, RN

## 2021-04-30 ENCOUNTER — Emergency Department (HOSPITAL_COMMUNITY)
Admission: EM | Admit: 2021-04-30 | Discharge: 2021-04-30 | Disposition: A | Discharge status: Home / Self Care | Payer: Medicare HMO | Attending: Emergency Medicine | Admitting: Emergency Medicine

## 2021-04-30 ENCOUNTER — Other Ambulatory Visit: Payer: Self-pay

## 2021-04-30 ENCOUNTER — Encounter (HOSPITAL_COMMUNITY): Payer: Self-pay | Admitting: *Deleted

## 2021-04-30 DIAGNOSIS — Z7982 Long term (current) use of aspirin: Secondary | ICD-10-CM | POA: Insufficient documentation

## 2021-04-30 DIAGNOSIS — Z7984 Long term (current) use of oral hypoglycemic drugs: Secondary | ICD-10-CM | POA: Insufficient documentation

## 2021-04-30 DIAGNOSIS — J45909 Unspecified asthma, uncomplicated: Secondary | ICD-10-CM | POA: Diagnosis not present

## 2021-04-30 DIAGNOSIS — Z79899 Other long term (current) drug therapy: Secondary | ICD-10-CM | POA: Diagnosis not present

## 2021-04-30 DIAGNOSIS — Z452 Encounter for adjustment and management of vascular access device: Secondary | ICD-10-CM | POA: Insufficient documentation

## 2021-04-30 DIAGNOSIS — Z7951 Long term (current) use of inhaled steroids: Secondary | ICD-10-CM | POA: Diagnosis not present

## 2021-04-30 DIAGNOSIS — I2510 Atherosclerotic heart disease of native coronary artery without angina pectoris: Secondary | ICD-10-CM | POA: Diagnosis not present

## 2021-04-30 DIAGNOSIS — I5033 Acute on chronic diastolic (congestive) heart failure: Secondary | ICD-10-CM | POA: Insufficient documentation

## 2021-04-30 DIAGNOSIS — I110 Hypertensive heart disease with heart failure: Secondary | ICD-10-CM | POA: Insufficient documentation

## 2021-04-30 DIAGNOSIS — Z794 Long term (current) use of insulin: Secondary | ICD-10-CM | POA: Diagnosis not present

## 2021-04-30 DIAGNOSIS — E119 Type 2 diabetes mellitus without complications: Secondary | ICD-10-CM | POA: Insufficient documentation

## 2021-04-30 DIAGNOSIS — Z87891 Personal history of nicotine dependence: Secondary | ICD-10-CM | POA: Insufficient documentation

## 2021-04-30 DIAGNOSIS — J449 Chronic obstructive pulmonary disease, unspecified: Secondary | ICD-10-CM | POA: Diagnosis not present

## 2021-04-30 NOTE — Telephone Encounter (Signed)
Received call from [facility] with [facility] stating [facility] nursing has not been able to reach patient to pull picc. [facility] has left a message for patient and spouse requesting call back.  CMA attempted to reach patient, but had to leave voicemail. Left message with spouse as well.  Anastasia Fiedler will continue to call patient to ensure picc is removed. Does not have appt until 5/18.  Juanita Laster, RMA

## 2021-04-30 NOTE — Telephone Encounter (Signed)
Patient returning call. RN relayed to patient that it is extremely important for his PICC line to come out as leaving it in puts him at increased risk of infection, blood clots, and possible death.   Patient does not wish for home health to come out and pull PICC as he has five very large dogs and cannot contain them. He states he will go to [facility] emergency department to have it pulled. RN advised him that he might be charged for an ED visit. 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 He says he will go today.   Advised patient to please call if he has any problems getting his PICC removed.   Sandie Ano, RN

## 2021-04-30 NOTE — ED Triage Notes (Signed)
Requesting PICC removal from right arm. Placed 3 weeks ago per patient

## 2021-04-30 NOTE — ED Notes (Signed)
[facility] in with patient to remove PICC line.

## 2021-04-30 NOTE — ED Provider Notes (Signed)
[facility] Provider Note   CSN: [id number] Arrival date & time: 04/30/21  1459     History No chief complaint on file.   Joseph [name] Orth is a 50 y.o. male.  50 year old male presents with request for PICC line removal as he has completed his antibiotics.  No complaints otherwise.        Past Medical History:  Diagnosis Date  . Anxiety   . Arthritis    "knees, shoulders, hips, ankles" (07/29/2016)  . Asthma   . CAD (coronary artery disease)    a. 2017: s/p BMS to distal Cx; b. LHC 05/30/2019: 80% mid, distal RCA s/p DES, 30% narrowing of d LM, widely patent LAD w/ luminal irregularities, widely patent stent in [place]  w/ 90+% stenosis distal to stent beofre small trifurcating obtuse marginal (potentially area of restenosis)  . Chronic bronchitis ([place])   . Chronic ulcer of right great toe ([place]) 09/19/2017  . COPD (chronic obstructive pulmonary disease) ([place])   . Depression   . DVT (deep venous thrombosis) ([place])   . GERD (gastroesophageal reflux disease)   . Hyperlipidemia   . Hypertension   . Myocardial infarction ([place]) 1996   "light one"  . PE (pulmonary embolism) 04/2013   On chronic Xarelto  . Peripheral nerve disease   . Pneumonia "several times"  . Sleep apnea   . Type II diabetes mellitus [facility])     Patient Active Problem List   Diagnosis Date Noted  . Left leg cellulitis 04/11/2021  . Cellulitis of left leg 03/05/2021  . Cellulitis 02/18/2021  . PAD (peripheral artery disease) ([place]) 10/10/2020  . Osteomyelitis of fourth toe of right foot ([place])   . S/P CABG x 4 07/24/2020  . Non-ST elevation (NSTEMI) myocardial infarction ([place]) 07/22/2020  . Acute on chronic diastolic heart failure ([place]) 07/22/2020  . Hypotension 07/18/2020  . Left leg swelling 07/18/2020  . Elevated troponin 07/18/2020  . AKI (acute kidney injury) ([place]) 07/18/2020  . Elevated brain natriuretic peptide (BNP) level 07/18/2020  . GERD (gastroesophageal reflux disease)  07/18/2020  . Severe Sepsis secondary to Lt leg/Foot Cellulitis 07/18/2020  . Severe sepsis --Severe Sepsis secondary to Lt leg/Foot Cellulitis with hypotension and AKI 07/18/2020  . Unstable angina ([place]) 11/01/2019  . Edema 06/13/2019  . Leukocytosis 05/31/2019  . Smoker 03/08/2019  . Chronic pain 03/08/2019  . Chronic back pain 02/16/2019  . Deep venous thrombosis ([place]) 02/16/2019  . Obesity, Class III, BMI 40-49.9 (morbid obesity) ([place]) 02/16/2019  . Peripheral nerve disease 02/16/2019  . COPD (chronic obstructive pulmonary disease) ([place]) 01/22/2019  . Anxiety disorder 03/26/2018  . Open wound of toe 10/12/2017  . Sinusitis 10/12/2017  . Abdominal pain   . Loss of weight   . Diabetic foot infection ([place]) 09/16/2017  . Uncontrolled type 2 diabetes mellitus with hyperglycemia, with long-term current use of insulin ([place]) 09/16/2017  . Cellulitis of right foot   . Chronic ulcer of great toe of right foot ([place]) 09/09/2017  . Recurrent chest pain 07/29/2016  . Hyperglycemia   . Insulin dependent type 2 diabetes mellitus ([place]) 04/29/2015  . OSA (obstructive sleep apnea) 05/08/2013  . History of pulmonary embolus (PE) 04/27/2013  . CAD -S/P PCI 11/02/19  09/10/2008  . Dyslipidemia 09/09/2008    Past Surgical History:  Procedure Laterality Date  . ABDOMINAL AORTOGRAM W/LOWER EXTREMITY N/A 10/10/2020   Procedure: ABDOMINAL AORTOGRAM W/LOWER EXTREMITY;  Surgeon: Elam Dutch, MD;  Location: [place] [facility];  Service: Cardiovascular;  Laterality: N/A;  . AMPUTATION Right 09/21/2017   Procedure: RIGHT GREAT TOE AMPUTATION, POSSIBLE VAC;  Surgeon: Leandrew Koyanagi, MD;  Location: [facility];  Service: Orthopedics;  Laterality: Right;  . AMPUTATION Left 08/13/2020   Procedure: LEFT FOOT 4TH RAY AMPUTATION;  Surgeon: Newt Minion, MD;  Location: [facility];  Service: Orthopedics;  Laterality: Left;  . AORTOGRAM Bilateral 03/13/2021   Procedure: ABDOMINAL AORTOGRAM WITH Left LOWER EXTREMITY  RUNOFF;  Surgeon: Cherre Robins, MD;  Location: [facility];  Service: Vascular;  Laterality: Bilateral;  . CARDIAC CATHETERIZATION  2006  . CARDIAC CATHETERIZATION  1996   "@ [facility]; when I had my heart attack"  . CARDIAC CATHETERIZATION N/A 07/29/2016   Procedure: Left Heart Cath and Coronary Angiography;  Surgeon: Lorretta Harp, MD;  Location: [place] [facility];  Service: Cardiovascular;  Laterality: N/A;  . CARDIAC CATHETERIZATION N/A 07/29/2016   Procedure: Coronary Stent Intervention;  Surgeon: Lorretta Harp, MD;  Location: [place] [facility];  Service: Cardiovascular;  Laterality: N/A;  . CARPAL TUNNEL RELEASE Bilateral   . CORONARY ANGIOPLASTY WITH STENT PLACEMENT  07/29/2016  . CORONARY ARTERY BYPASS GRAFT N/A 07/24/2020   Procedure: CORONARY ARTERY BYPASS GRAFTING (CABG), ON PUMP, TIMES FOUR, USING LEFT INTERNAL MAMMARY ARTERY AND ENDOSCOPICALLY HARVESTED RIGHT GREATER SAPHENOUS VEIN;  Surgeon: Lajuana Matte, MD;  Location: [facility];  Service: Open Heart Surgery;  Laterality: N/A;  FLOW TAC  . CORONARY STENT INTERVENTION N/A 05/30/2019   Procedure: CORONARY STENT INTERVENTION;  Surgeon: Belva Crome, MD;  Location: [place] [facility];  Service: Cardiovascular;  Laterality: N/A;  . CORONARY STENT INTERVENTION N/A 11/02/2019   Procedure: CORONARY STENT INTERVENTION;  Surgeon: Wellington Hampshire, MD;  Location: [place] [facility];  Service: Cardiovascular;  Laterality: N/A;  . ESOPHAGOGASTRODUODENOSCOPY N/A 09/22/2017   Procedure: ESOPHAGOGASTRODUODENOSCOPY (EGD);  Surgeon: Ladene Artist, MD;  Location: [facility];  Service: Endoscopy;  Laterality: N/A;  . INTRAVASCULAR PRESSURE WIRE/FFR STUDY N/A 07/22/2020   Procedure: INTRAVASCULAR PRESSURE WIRE/FFR STUDY;  Surgeon: Leonie Man, MD;  Location: [place] [facility];  Service: Cardiovascular;  Laterality: N/A;  . KNEE ARTHROSCOPY Bilateral    "2 on left; 1 on the right"  . LEFT HEART CATH AND CORONARY ANGIOGRAPHY N/A 11/02/2019    Procedure: LEFT HEART CATH AND CORONARY ANGIOGRAPHY;  Surgeon: Wellington Hampshire, MD;  Location: [place] [facility];  Service: Cardiovascular;  Laterality: N/A;  . LEFT HEART CATH AND CORONARY ANGIOGRAPHY N/A 07/22/2020   Procedure: LEFT HEART CATH AND CORONARY ANGIOGRAPHY;  Surgeon: Leonie Man, MD;  Location: [place] [facility];  Service: Cardiovascular;  Laterality: N/A;  . PERIPHERAL VASCULAR INTERVENTION Left 10/11/2020   popliteal and SFA stent placement   . PERIPHERAL VASCULAR INTERVENTION Left 10/10/2020   Procedure: PERIPHERAL VASCULAR INTERVENTION;  Surgeon: Elam Dutch, MD;  Location: [facility];  Service: Cardiovascular;  Laterality: Left;  . RIGHT/LEFT HEART CATH AND CORONARY ANGIOGRAPHY N/A 05/30/2019   Procedure: RIGHT/LEFT HEART CATH AND CORONARY ANGIOGRAPHY;  Surgeon: Belva Crome, MD;  Location: [facility];  Service: Cardiovascular;  Laterality: N/A;  . SHOULDER OPEN ROTATOR CUFF REPAIR Bilateral   . TEE WITHOUT CARDIOVERSION N/A 07/24/2020   Procedure: TRANSESOPHAGEAL ECHOCARDIOGRAM (TEE);  Surgeon: Lajuana Matte, MD;  Location: [facility];  Service: Open Heart Surgery;  Laterality: N/A;       Family History  Problem Relation Age of Onset  .  Hypertension Brother   . Hypertension Father   . Diabetes Other   . Hyperlipidemia Other     Social History   Tobacco Use  . Smoking status: Former Smoker    Packs/day: 1.00    Years: 34.00    Pack years: 34.00    Types: Cigarettes    Quit date: 07/12/2020    Years since quitting: 0.8  . Smokeless tobacco: Former Systems developer    Types: Snuff, Chew  Vaping Use  . Vaping Use: Never used  Substance Use Topics  . Alcohol use: Yes    Comment: RARE  . Drug use: No    Home Medications Prior to Admission medications   Medication Sig Start Date End Date Taking? Authorizing Provider  albuterol (VENTOLIN HFA) 108 (90 Base) MCG/ACT inhaler Inhale 2 puffs into the lungs every 6 (six) hours as needed for  wheezing or shortness of breath.    [provider]  ALPRAZolam Duanne Moron) 1 MG tablet Take 1 mg by mouth See admin instructions. 1 tablet by mouth twice daily and 2 tabs at bedtime.    [provider]  aspirin EC 81 MG tablet Take 1 tablet (81 mg total) by mouth daily. Swallow whole. 03/14/21   Jeralyn Bennett, MD  atorvastatin (LIPITOR) 80 MG tablet Take 1 tablet (80 mg total) by mouth daily at 6 PM. 05/31/19   Sande Rives E, PA-C  blood glucose meter kit and supplies KIT Dispense based on patient and insurance preference. Use up to four times daily as directed. (FOR ICD-9 250.00, 250.01). 09/23/17   Hongalgi, Lenis Dickinson, MD  buPROPion (WELLBUTRIN SR) 150 MG 12 hr tablet Take 150 mg by mouth 2 (two) times daily.  08/17/16   [provider]  clopidogrel (PLAVIX) 75 MG tablet Take 1 tablet (75 mg total) by mouth daily with breakfast. 10/11/20   Ulyses Amor, PA-C  Continuous Blood Gluc Sensor (FREESTYLE LIBRE 14 DAY SENSOR) MISC Inject 1 patch into the skin every 14 (fourteen) days.    [provider]  escitalopram (LEXAPRO) 10 MG tablet Take 10 mg by mouth in the morning. 06/10/20   [provider]  fenofibrate (TRICOR) 145 MG tablet Take 145 mg by mouth in the morning.    [provider]  fluconazole (DIFLUCAN) 150 MG tablet Take 150 mg by mouth every Monday.    [provider]  fluticasone (FLONASE) 50 MCG/ACT nasal spray Place 2 sprays into both nostrils in the morning. 03/14/19   [provider]  Fluticasone Propionate, Inhal, (FLOVENT DISKUS) 250 MCG/BLIST AEPB Inhale 2 puffs into the lungs in the morning and at bedtime.    [provider]  furosemide (LASIX) 40 MG tablet Take 1 tablet (40 mg total) by mouth daily. 11/12/20 02/10/21  Skeet Latch, MD  insulin lispro (HUMALOG KWIKPEN) 100 UNIT/ML KwikPen Inject 12 Units into the skin 3 (three) times daily. Patient taking differently: Inject 0-12 Units into the skin  3 (three) times daily. Sliding Scale 03/14/21   Jeralyn Bennett, MD  LANTUS SOLOSTAR 100 UNIT/ML Solostar Pen Inject 46 Units into the skin daily before breakfast AND 52 Units at bedtime. 03/14/21   Jeralyn Bennett, MD  lidocaine (LIDODERM) 5 % Place 1 patch onto the skin daily. Remove & Discard patch within 12 hours or as directed by MD 04/15/21   Manuella Ghazi, Pratik D, DO  meclizine (ANTIVERT) 25 MG tablet Take 25 mg by mouth 3 (three) times daily as needed for dizziness. Take 50 mg by  mouth in the morning & 25 mg by mouth at night 04/10/13   [provider]  metFORMIN (GLUCOPHAGE) 1000 MG tablet Take 1 tablet (1,000 mg total) by mouth 2 (two) times daily with a meal. 11/05/19   Isaiah Serge, NP  midodrine (PROAMATINE) 5 MG tablet Take 5 mg by mouth 3 (three) times daily with meals.    [provider]  mupirocin cream (BACTROBAN) 2 % Apply topically daily. 03/15/21   Jeralyn Bennett, MD  naloxone [facility]) nasal spray 4 mg/0.1 mL Place 4 mg into the nose as needed (overdose).    [provider]  nitroGLYCERIN (NITROSTAT) 0.4 MG SL tablet Place 0.4 mg under the tongue every 5 (five) minutes as needed for chest pain.    [provider]  oxyCODONE (ROXICODONE) 15 MG immediate release tablet Take 1 tablet (15 mg total) by mouth 5 (five) times daily. 03/14/21   Jeralyn Bennett, MD  pantoprazole (PROTONIX) 40 MG tablet Take 1 tablet (40 mg total) by mouth daily. Patient taking differently: Take 80 mg by mouth daily before breakfast. 09/23/17   Hongalgi, Lenis Dickinson, MD  pregabalin (LYRICA) 150 MG capsule Take 150 mg by mouth 3 (three) times daily. 05/06/19   [provider]  promethazine (PHENERGAN) 25 MG tablet Take 25 mg by mouth every 4 (four) hours as needed for nausea or vomiting.    [provider]  VICTOZA 18 MG/3ML SOPN Inject 1.2 mg into the skin at bedtime. 04/30/19   [provider]    Allergies    Sulfa antibiotics  Review of Systems    Review of Systems  Constitutional: Negative for fever.  Musculoskeletal: Negative for arthralgias and myalgias.  Skin: Negative for color change, rash and wound.  Neurological: Negative for numbness.  Hematological: Negative for adenopathy.    Physical Exam Updated Vital Signs BP 132/78   Pulse 88   Temp 98.2 F (36.8 C) (Oral)   Resp 18   Ht _0  (1.956 m)   Wt (!) 163.3 kg   SpO2 98%   BMI 42.69 kg/m   Physical Exam Vitals and nursing note reviewed.  Constitutional:      General: He is not in acute distress.    Appearance: He is well-developed. He is not diaphoretic.  HENT:     Head: Normocephalic and atraumatic.  Cardiovascular:     Rate and Rhythm: Normal rate and regular rhythm.     Pulses: Normal pulses.     Heart sounds: Normal heart sounds.  Pulmonary:     Effort: Pulmonary effort is normal.     Breath sounds: Normal breath sounds.  Musculoskeletal:        General: No swelling or tenderness.  Skin:    General: Skin is warm and dry.     Findings: No erythema or rash.       Neurological:     Mental Status: He is alert and oriented to person, place, and time.  Psychiatric:        Behavior: Behavior normal.     ED Results / Procedures / Treatments   Labs (all labs ordered are listed, but only abnormal results are displayed) Labs Reviewed - No data to display  EKG None  Radiology No results found.  Procedures Procedures   Medications Ordered in ED Medications - No data to display  ED Course  I have reviewed the triage vital signs and the nursing notes.  Pertinent labs & imaging results that were available during  my care of the patient were reviewed by me and considered in my medical decision making (see chart for details).  Clinical Course as of 04/30/21 1711  Thu May 01, 3851  3697 50 year old male with request for removal of PICC arm from right arm, placed 3 weeks ago.  PICC line removal requested from nursing staff. [LM]  1533 Removed  by PICC line team nurse, no complications.  Patient understands discharge instructions. [LM]    Clinical Course User Index [LM] Roque Lias   MDM Rules/Calculators/A&P                          Final Clinical Impression(s) / ED Diagnoses Final diagnoses:  PICC (peripherally inserted central catheter) removal    Rx / DC Orders ED Discharge Orders    None       Roque Lias 04/30/21 1711    Noemi Chapel, MD 04/30/21 [phone]

## 2021-04-30 NOTE — ED Notes (Signed)
PICC line removed from RUA. PICC 47cm upon removal. No complaints of pain. Patient tolerated well with no complaints. No bleeding noted from site. Pressure dressing applied. Patient instructed to leave dressing in place for 2hrs and return to ED for any profuse/uncontrolled bleeding.

## 2021-05-06 ENCOUNTER — Other Ambulatory Visit: Payer: Self-pay

## 2021-05-06 ENCOUNTER — Ambulatory Visit (INDEPENDENT_AMBULATORY_CARE_PROVIDER_SITE_OTHER): Payer: Medicare HMO | Admitting: Internal Medicine

## 2021-05-06 VITALS — Ht 72.0 in | Wt 352.0 lb

## 2021-05-06 DIAGNOSIS — L03116 Cellulitis of left lower limb: Secondary | ICD-10-CM | POA: Diagnosis not present

## 2021-05-06 DIAGNOSIS — R609 Edema, unspecified: Secondary | ICD-10-CM

## 2021-05-06 MED ORDER — MUPIROCIN CALCIUM 2 % EX CREA: TOPICAL_CREAM | Freq: Every day | CUTANEOUS | 0 refills | Status: DC

## 2021-05-06 MED ORDER — DOXYCYCLINE HYCLATE 100 MG PO TABS: 100.0000 mg | ORAL_TABLET | Freq: Two times a day (BID) | ORAL | 0 refills | Status: DC

## 2021-05-06 MED ORDER — CIPROFLOXACIN HCL 500 MG PO TABS: 500.0000 mg | ORAL_TABLET | Freq: Two times a day (BID) | ORAL | 0 refills | Status: DC

## 2021-05-06 NOTE — Progress Notes (Signed)
Patient ID: Joseph [name], male   DOB: 1971-04-13, 50 y.o.   MRN: [id number]  HPI Joseph [name] is a 50 y.o. male with hx of T2DM, PAD, hx of left SFA and poleteal angioplasty with stent . Hx of left toe amputation with small plantar ulcer. intermittent antibiotics.in the last month had worsening erythema to left calf, "red like a tomato" with drainage to anterior tibial region. He was recently hospitalized with 10 days of vanco. Re-evaluated by vascular who felt no furthersteing needed. His mri of foot and calf did not show evidence of osteomyelitis.  Recurrent cellulitis in mid march and also at end of April - treated with daptomycin through may 5th 2022. abi was okay.  Patient reports that he is having worsening drainage from his leg/foot  Outpatient Encounter Medications as of 05/06/2021  Medication Sig  . albuterol (VENTOLIN HFA) 108 (90 Base) MCG/ACT inhaler Inhale 2 puffs into the lungs every 6 (six) hours as needed for wheezing or shortness of breath.  . ALPRAZolam (XANAX) 1 MG tablet Take 1 mg by mouth See admin instructions. 1 tablet by mouth twice daily and 2 tabs at bedtime.  Marland Kitchen aspirin EC 81 MG tablet Take 1 tablet (81 mg total) by mouth daily. Swallow whole.  Marland Kitchen atorvastatin (LIPITOR) 80 MG tablet Take 1 tablet (80 mg total) by mouth daily at 6 PM.  . blood glucose meter kit and supplies KIT Dispense based on patient and insurance preference. Use up to four times daily as directed. (FOR ICD-9 250.00, 250.01).  Marland Kitchen buPROPion (WELLBUTRIN SR) 150 MG 12 hr tablet Take 150 mg by mouth 2 (two) times daily.   . clopidogrel (PLAVIX) 75 MG tablet Take 1 tablet (75 mg total) by mouth daily with breakfast.  . Continuous Blood Gluc Sensor (FREESTYLE LIBRE 14 DAY SENSOR) MISC Inject 1 patch into the skin every 14 (fourteen) days.  Marland Kitchen escitalopram (LEXAPRO) 10 MG tablet Take 10 mg by mouth in the morning.  . fenofibrate (TRICOR) 145 MG tablet Take 145 mg by mouth in the morning.  .  fluconazole (DIFLUCAN) 150 MG tablet Take 150 mg by mouth every Monday.  . fluticasone (FLONASE) 50 MCG/ACT nasal spray Place 2 sprays into both nostrils in the morning.  . Fluticasone Propionate, Inhal, (FLOVENT DISKUS) 250 MCG/BLIST AEPB Inhale 2 puffs into the lungs in the morning and at bedtime.  . insulin lispro (HUMALOG KWIKPEN) 100 UNIT/ML KwikPen Inject 12 Units into the skin 3 (three) times daily. (Patient taking differently: Inject 0-12 Units into the skin 3 (three) times daily. Sliding Scale)  . LANTUS SOLOSTAR 100 UNIT/ML Solostar Pen Inject 46 Units into the skin daily before breakfast AND 52 Units at bedtime.  . lidocaine (LIDODERM) 5 % Place 1 patch onto the skin daily. Remove & Discard patch within 12 hours or as directed by MD  . meclizine (ANTIVERT) 25 MG tablet Take 25 mg by mouth 3 (three) times daily as needed for dizziness. Take 50 mg by mouth in the morning & 25 mg by mouth at night  . metFORMIN (GLUCOPHAGE) 1000 MG tablet Take 1 tablet (1,000 mg total) by mouth 2 (two) times daily with a meal.  . midodrine (PROAMATINE) 5 MG tablet Take 5 mg by mouth 3 (three) times daily with meals.  . mupirocin cream (BACTROBAN) 2 % Apply topically daily.  . naloxone (NARCAN) nasal spray 4 mg/0.1 mL Place 4 mg into the nose as needed (overdose).  . nitroGLYCERIN (NITROSTAT) 0.4 MG  SL tablet Place 0.4 mg under the tongue every 5 (five) minutes as needed for chest pain.  Marland Kitchen oxyCODONE (ROXICODONE) 15 MG immediate release tablet Take 1 tablet (15 mg total) by mouth 5 (five) times daily.  . pantoprazole (PROTONIX) 40 MG tablet Take 1 tablet (40 mg total) by mouth daily. (Patient taking differently: Take 80 mg by mouth daily before breakfast.)  . pregabalin (LYRICA) 150 MG capsule Take 150 mg by mouth 3 (three) times daily.  . promethazine (PHENERGAN) 25 MG tablet Take 25 mg by mouth every 4 (four) hours as needed for nausea or vomiting.  Marland Kitchen VICTOZA 18 MG/3ML SOPN Inject 1.2 mg into the skin at  bedtime.  . furosemide (LASIX) 40 MG tablet Take 1 tablet (40 mg total) by mouth daily.   No facility-administered encounter medications on file as of 05/06/2021.     Patient Active Problem List   Diagnosis Date Noted  . Left leg cellulitis 04/11/2021  . Cellulitis of left leg 03/05/2021  . Cellulitis 02/18/2021  . PAD (peripheral artery disease) ([place]) 10/10/2020  . Osteomyelitis of fourth toe of right foot ([place])   . S/P CABG x 4 07/24/2020  . Non-ST elevation (NSTEMI) myocardial infarction ([place]) 07/22/2020  . Acute on chronic diastolic heart failure ([place]) 07/22/2020  . Hypotension 07/18/2020  . Left leg swelling 07/18/2020  . Elevated troponin 07/18/2020  . AKI (acute kidney injury) ([place]) 07/18/2020  . Elevated brain natriuretic peptide (BNP) level 07/18/2020  . GERD (gastroesophageal reflux disease) 07/18/2020  . Severe Sepsis secondary to Lt leg/Foot Cellulitis 07/18/2020  . Severe sepsis --Severe Sepsis secondary to Lt leg/Foot Cellulitis with hypotension and AKI 07/18/2020  . Unstable angina ([place]) 11/01/2019  . Edema 06/13/2019  . Leukocytosis 05/31/2019  . Smoker 03/08/2019  . Chronic pain 03/08/2019  . Chronic back pain 02/16/2019  . Deep venous thrombosis ([place]) 02/16/2019  . Obesity, Class III, BMI 40-49.9 (morbid obesity) ([place]) 02/16/2019  . Peripheral nerve disease 02/16/2019  . COPD (chronic obstructive pulmonary disease) ([place]) 01/22/2019  . Anxiety disorder 03/26/2018  . Open wound of toe 10/12/2017  . Sinusitis 10/12/2017  . Abdominal pain   . Loss of weight   . Diabetic foot infection ([place]) 09/16/2017  . Uncontrolled type 2 diabetes mellitus with hyperglycemia, with long-term current use of insulin ([place]) 09/16/2017  . Cellulitis of right foot   . Chronic ulcer of great toe of right foot ([place]) 09/09/2017  . Recurrent chest pain 07/29/2016  . Hyperglycemia   . Insulin dependent type 2 diabetes mellitus ([place]) 04/29/2015  . OSA (obstructive sleep apnea)  05/08/2013  . History of pulmonary embolus (PE) 04/27/2013  . CAD -S/P PCI 11/02/19  09/10/2008  . Dyslipidemia 09/09/2008     Health Maintenance Due  Topic Date Due  . COVID-19 Vaccine (1) Never done  . OPHTHALMOLOGY EXAM  Never done  . Hepatitis C Screening  Never done  . FOOT EXAM  04/28/2016  . URINE MICROALBUMIN  05/27/2016  . COLONOSCOPY (Pts 45-58yr Insurance coverage will need to be confirmed)  Never done  . TETANUS/TDAP  06/20/2019     Review of Systems 12 point ros is negative except for erythema/plantar ulcer + Physical Exam   Ht 6' (1.829 m)   Wt (!) 352 lb (159.7 kg)   BMI 47.74 kg/m   gen = a x o by 3 in nad Skin = Left leg blanching erythema to anterior shin. Purulence about eschar Ext = + 1 pitting edema CBC Lab Results  Component Value Date   WBC 7.0 04/14/2021   RBC 4.46 04/14/2021   HGB 11.2 (L) 04/14/2021   HCT 37.5 (L) 04/14/2021   PLT 229 04/14/2021   MCV 84.1 04/14/2021   MCH 25.1 (L) 04/14/2021   MCHC 29.9 (L) 04/14/2021   RDW 15.5 04/14/2021   LYMPHSABS 2.6 03/04/2021   MONOABS 0.8 03/04/2021   EOSABS 0.2 03/04/2021    BMET Lab Results  Component Value Date   NA 135 04/14/2021   K 4.5 04/14/2021   CL 103 04/14/2021   CO2 23 04/14/2021   GLUCOSE 182 (H) 04/14/2021   BUN 27 (H) 04/14/2021   CREATININE 1.43 (H) 04/14/2021   CALCIUM 8.6 (L) 04/14/2021   GFRNONAA >60 04/14/2021   GFRAA 72 11/20/2020      Assessment and Plan  Recurrent Cellulitis and purulent wound - will do doxy plus cipro. Follow up with wound care Recommend compression socks and ace bandage

## 2021-05-13 ENCOUNTER — Other Ambulatory Visit: Payer: Self-pay

## 2021-05-13 MED ORDER — FUROSEMIDE 40 MG PO TABS: 40.0000 mg | ORAL_TABLET | Freq: Every day | ORAL | 3 refills | Status: DC

## 2021-05-14 ENCOUNTER — Ambulatory Visit: Payer: Medicare HMO | Admitting: Internal Medicine

## 2021-05-21 ENCOUNTER — Ambulatory Visit: Payer: Medicare HMO | Admitting: Internal Medicine

## 2021-05-21 ENCOUNTER — Telehealth: Payer: Self-pay

## 2021-05-21 NOTE — Telephone Encounter (Signed)
Attempted to call patient twice regarding today's appointment, call could not go through and had a busy signal.   Sandie Ano, RN

## 2021-06-03 ENCOUNTER — Ambulatory Visit: Payer: Medicare HMO | Admitting: Internal Medicine

## 2021-06-09 ENCOUNTER — Other Ambulatory Visit: Payer: Self-pay | Admitting: Internal Medicine

## 2021-07-09 ENCOUNTER — Other Ambulatory Visit: Payer: Self-pay

## 2021-07-09 ENCOUNTER — Inpatient Hospital Stay (HOSPITAL_COMMUNITY)
Admission: EM | Admit: 2021-07-09 | Discharge: 2021-07-13 | DRG: 871 | Disposition: A | Discharge status: Home Health | Payer: Medicare HMO | Attending: Internal Medicine | Admitting: Internal Medicine

## 2021-07-09 ENCOUNTER — Emergency Department (HOSPITAL_COMMUNITY): Payer: Medicare HMO

## 2021-07-09 ENCOUNTER — Encounter (HOSPITAL_COMMUNITY): Payer: Self-pay

## 2021-07-09 DIAGNOSIS — Z951 Presence of aortocoronary bypass graft: Secondary | ICD-10-CM

## 2021-07-09 DIAGNOSIS — E1100 Type 2 diabetes mellitus with hyperosmolarity without nonketotic hyperglycemic-hyperosmolar coma (NKHHC): Secondary | ICD-10-CM | POA: Diagnosis present

## 2021-07-09 DIAGNOSIS — Z28310 Unvaccinated for covid-19: Secondary | ICD-10-CM | POA: Diagnosis not present

## 2021-07-09 DIAGNOSIS — J449 Chronic obstructive pulmonary disease, unspecified: Secondary | ICD-10-CM | POA: Diagnosis present

## 2021-07-09 DIAGNOSIS — E785 Hyperlipidemia, unspecified: Secondary | ICD-10-CM | POA: Diagnosis present

## 2021-07-09 DIAGNOSIS — Z20822 Contact with and (suspected) exposure to covid-19: Secondary | ICD-10-CM | POA: Diagnosis present

## 2021-07-09 DIAGNOSIS — E872 Acidosis: Secondary | ICD-10-CM | POA: Diagnosis present

## 2021-07-09 DIAGNOSIS — E11628 Type 2 diabetes mellitus with other skin complications: Secondary | ICD-10-CM | POA: Diagnosis not present

## 2021-07-09 DIAGNOSIS — R0902 Hypoxemia: Secondary | ICD-10-CM | POA: Diagnosis not present

## 2021-07-09 DIAGNOSIS — E860 Dehydration: Secondary | ICD-10-CM | POA: Diagnosis present

## 2021-07-09 DIAGNOSIS — I10 Essential (primary) hypertension: Secondary | ICD-10-CM | POA: Diagnosis present

## 2021-07-09 DIAGNOSIS — A419 Sepsis, unspecified organism: Secondary | ICD-10-CM | POA: Diagnosis present

## 2021-07-09 DIAGNOSIS — E119 Type 2 diabetes mellitus without complications: Secondary | ICD-10-CM | POA: Diagnosis not present

## 2021-07-09 DIAGNOSIS — E669 Obesity, unspecified: Secondary | ICD-10-CM | POA: Diagnosis present

## 2021-07-09 DIAGNOSIS — Z89411 Acquired absence of right great toe: Secondary | ICD-10-CM

## 2021-07-09 DIAGNOSIS — S91109D Unspecified open wound of unspecified toe(s) without damage to nail, subsequent encounter: Secondary | ICD-10-CM

## 2021-07-09 DIAGNOSIS — Z6841 Body Mass Index (BMI) 40.0 and over, adult: Secondary | ICD-10-CM

## 2021-07-09 DIAGNOSIS — L03116 Cellulitis of left lower limb: Secondary | ICD-10-CM | POA: Diagnosis present

## 2021-07-09 DIAGNOSIS — R6520 Severe sepsis without septic shock: Secondary | ICD-10-CM | POA: Diagnosis present

## 2021-07-09 DIAGNOSIS — I2510 Atherosclerotic heart disease of native coronary artery without angina pectoris: Secondary | ICD-10-CM | POA: Diagnosis present

## 2021-07-09 DIAGNOSIS — N179 Acute kidney failure, unspecified: Secondary | ICD-10-CM | POA: Diagnosis present

## 2021-07-09 DIAGNOSIS — Z955 Presence of coronary angioplasty implant and graft: Secondary | ICD-10-CM

## 2021-07-09 DIAGNOSIS — E1165 Type 2 diabetes mellitus with hyperglycemia: Secondary | ICD-10-CM | POA: Diagnosis present

## 2021-07-09 DIAGNOSIS — Z794 Long term (current) use of insulin: Secondary | ICD-10-CM | POA: Diagnosis not present

## 2021-07-09 DIAGNOSIS — L089 Local infection of the skin and subcutaneous tissue, unspecified: Secondary | ICD-10-CM

## 2021-07-09 DIAGNOSIS — R739 Hyperglycemia, unspecified: Secondary | ICD-10-CM | POA: Diagnosis present

## 2021-07-09 DIAGNOSIS — F32A Depression, unspecified: Secondary | ICD-10-CM | POA: Diagnosis present

## 2021-07-09 DIAGNOSIS — I252 Old myocardial infarction: Secondary | ICD-10-CM | POA: Diagnosis not present

## 2021-07-09 DIAGNOSIS — Z79899 Other long term (current) drug therapy: Secondary | ICD-10-CM

## 2021-07-09 DIAGNOSIS — Z882 Allergy status to sulfonamides status: Secondary | ICD-10-CM

## 2021-07-09 DIAGNOSIS — G4733 Obstructive sleep apnea (adult) (pediatric): Secondary | ICD-10-CM | POA: Diagnosis present

## 2021-07-09 DIAGNOSIS — F419 Anxiety disorder, unspecified: Secondary | ICD-10-CM | POA: Diagnosis present

## 2021-07-09 DIAGNOSIS — Z89421 Acquired absence of other right toe(s): Secondary | ICD-10-CM

## 2021-07-09 DIAGNOSIS — Z833 Family history of diabetes mellitus: Secondary | ICD-10-CM

## 2021-07-09 DIAGNOSIS — Z7901 Long term (current) use of anticoagulants: Secondary | ICD-10-CM | POA: Diagnosis not present

## 2021-07-09 DIAGNOSIS — G894 Chronic pain syndrome: Secondary | ICD-10-CM | POA: Diagnosis present

## 2021-07-09 DIAGNOSIS — E46 Unspecified protein-calorie malnutrition: Secondary | ICD-10-CM | POA: Diagnosis present

## 2021-07-09 DIAGNOSIS — Z8249 Family history of ischemic heart disease and other diseases of the circulatory system: Secondary | ICD-10-CM

## 2021-07-09 DIAGNOSIS — Z7902 Long term (current) use of antithrombotics/antiplatelets: Secondary | ICD-10-CM

## 2021-07-09 DIAGNOSIS — M25511 Pain in right shoulder: Secondary | ICD-10-CM | POA: Diagnosis present

## 2021-07-09 DIAGNOSIS — Z87891 Personal history of nicotine dependence: Secondary | ICD-10-CM

## 2021-07-09 DIAGNOSIS — Z7982 Long term (current) use of aspirin: Secondary | ICD-10-CM

## 2021-07-09 DIAGNOSIS — I959 Hypotension, unspecified: Secondary | ICD-10-CM | POA: Diagnosis present

## 2021-07-09 DIAGNOSIS — Z7984 Long term (current) use of oral hypoglycemic drugs: Secondary | ICD-10-CM

## 2021-07-09 DIAGNOSIS — Z86711 Personal history of pulmonary embolism: Secondary | ICD-10-CM

## 2021-07-09 LAB — COMPREHENSIVE METABOLIC PANEL
ALT: 27 U/L (ref 0–44)
AST: 30 U/L (ref 15–41)
Albumin: 2.7 g/dL — ABNORMAL LOW (ref 3.5–5.0)
Alkaline Phosphatase: 99 U/L (ref 38–126)
Anion gap: 13 (ref 5–15)
BUN: 20 mg/dL (ref 6–20)
CO2: 22 mmol/L (ref 22–32)
Calcium: 7.9 mg/dL — ABNORMAL LOW (ref 8.9–10.3)
Chloride: 90 mmol/L — ABNORMAL LOW (ref 98–111)
Creatinine, Ser: 1.72 mg/dL — ABNORMAL HIGH (ref 0.61–1.24)
GFR, Estimated: 48 mL/min — ABNORMAL LOW (ref >60)
Glucose, Bld: 821 mg/dL (ref 70–99)
Potassium: 3.7 mmol/L (ref 3.5–5.1)
Sodium: 125 mmol/L — ABNORMAL LOW (ref 135–145)
Total Bilirubin: 0.7 mg/dL (ref 0.3–1.2)
Total Protein: 6.5 g/dL (ref 6.5–8.1)

## 2021-07-09 LAB — CBC WITH DIFFERENTIAL/PLATELET
Abs Immature Granulocytes: 0.05 10*3/uL (ref 0.00–0.07)
Basophils Absolute: 0.1 10*3/uL (ref 0.0–0.1)
Basophils Relative: 1 %
Eosinophils Absolute: 0.1 10*3/uL (ref 0.0–0.5)
Eosinophils Relative: 1 %
HCT: 36.7 % — ABNORMAL LOW (ref 39.0–52.0)
Hemoglobin: 11.8 g/dL — ABNORMAL LOW (ref 13.0–17.0)
Immature Granulocytes: 1 %
Lymphocytes Relative: 15 %
Lymphs Abs: 1.2 10*3/uL (ref 0.7–4.0)
MCH: 26.9 pg (ref 26.0–34.0)
MCHC: 32.2 g/dL (ref 30.0–36.0)
MCV: 83.6 fL (ref 80.0–100.0)
Monocytes Absolute: 0.8 10*3/uL (ref 0.1–1.0)
Monocytes Relative: 10 %
Neutro Abs: 5.6 10*3/uL (ref 1.7–7.7)
Neutrophils Relative %: 72 %
Platelets: 190 10*3/uL (ref 150–400)
RBC: 4.39 MIL/uL (ref 4.22–5.81)
RDW: 15 % (ref 11.5–15.5)
WBC: 7.8 10*3/uL (ref 4.0–10.5)
nRBC: 0 % (ref 0.0–0.2)

## 2021-07-09 LAB — LACTIC ACID, PLASMA: Lactic Acid, Venous: 6.7 mmol/L (ref 0.5–1.9)

## 2021-07-09 LAB — TROPONIN I (HIGH SENSITIVITY): Troponin I (High Sensitivity): 18 ng/L — ABNORMAL HIGH (ref <18)

## 2021-07-09 LAB — LIPASE, BLOOD: Lipase: 28 U/L (ref 11–51)

## 2021-07-09 MED ORDER — DEXTROSE IN LACTATED RINGERS 5 % IV SOLN: INTRAVENOUS | Status: DC

## 2021-07-09 MED ORDER — VANCOMYCIN HCL 10 G IV SOLR
2500.0000 mg | Freq: Once | INTRAVENOUS | Status: DC
  Filled 2021-07-09: qty 2500

## 2021-07-09 MED ORDER — LACTATED RINGERS IV SOLN: INTRAVENOUS | Status: DC

## 2021-07-09 MED ORDER — INSULIN REGULAR(HUMAN) IN NACL 100-0.9 UT/100ML-% IV SOLN
INTRAVENOUS | Status: DC
  Administered 2021-07-10 (×2): 13 [IU]/h via INTRAVENOUS
  Filled 2021-07-09 (×2): qty 100

## 2021-07-09 MED ORDER — PIPERACILLIN-TAZOBACTAM 3.375 G IVPB 30 MIN
3.3750 g | Freq: Once | INTRAVENOUS | Status: AC
  Administered 2021-07-09: 3.375 g via INTRAVENOUS
  Filled 2021-07-09: qty 50

## 2021-07-09 MED ORDER — VANCOMYCIN HCL 2000 MG/400ML IV SOLN
2000.0000 mg | Freq: Once | INTRAVENOUS | Status: AC
  Administered 2021-07-10: 2000 mg via INTRAVENOUS
  Filled 2021-07-09: qty 400

## 2021-07-09 MED ORDER — DEXTROSE 50 % IV SOLN: 0.0000 mL | INTRAVENOUS | Status: DC | PRN

## 2021-07-09 MED ORDER — LACTATED RINGERS IV BOLUS
20.0000 mL/kg | Freq: Once | INTRAVENOUS | Status: AC
  Administered 2021-07-10: 3176 mL via INTRAVENOUS

## 2021-07-09 MED ORDER — ONDANSETRON HCL 4 MG/2ML IJ SOLN
4.0000 mg | Freq: Once | INTRAMUSCULAR | Status: AC
  Administered 2021-07-09: 4 mg via INTRAVENOUS
  Filled 2021-07-09: qty 2

## 2021-07-09 NOTE — ED Triage Notes (Signed)
Pt presents to ED with pain reported to upper abdomen x 2 days. Pt c/o generalized weakness x 2 days. Bilateral leg swelling (chronic).

## 2021-07-09 NOTE — ED Notes (Signed)
Date and time results received: 07/09/21 2242  Test: glucose Critical Value: 821  Name of Provider Notified: Particia Nearing, MD  Orders Received? Or Actions Taken?: acknowledged

## 2021-07-09 NOTE — ED Provider Notes (Signed)
[facility] EMERGENCY DEPARTMENT Provider Note   CSN: [id number] Arrival date & time: 07/09/21  2040     History Chief Complaint  Patient presents with   Abdominal Pain    LUQ pain    Joseph [name] is a 50 y.o. male.  Pt presents to the ED today with several complaints.  He has left leg redness and swelling which is worse than normal.  He also has some LUQ pain and right axilla pain.  He has had a few episodes of n/v.  Pt has a hx of cellulitis which has been very difficult to control.  He has been on abx almost continuously for 1.5 years.  He does see ID for the cellulitis.  Pt denies any sob.  No fevers.      Past Medical History:  Diagnosis Date   Anxiety    Arthritis    "knees, shoulders, hips, ankles" (07/29/2016)   Asthma    CAD (coronary artery disease)    a. 2017: s/p BMS to distal Cx; b. LHC 05/30/2019: 80% mid, distal RCA s/p DES, 30% narrowing of d LM, widely patent LAD w/ luminal irregularities, widely patent stent in [place]  w/ 90+% stenosis distal to stent beofre small trifurcating obtuse marginal (potentially area of restenosis)   Chronic bronchitis (HCC)    Chronic ulcer of right great toe ([place]) 09/19/2017   COPD (chronic obstructive pulmonary disease) (HCC)    Depression    DVT (deep venous thrombosis) (HCC)    GERD (gastroesophageal reflux disease)    Hyperlipidemia    Hypertension    Myocardial infarction ([place]) 1996   "light one"   PE (pulmonary embolism) 04/2013   On chronic Xarelto   Peripheral nerve disease    Pneumonia "several times"   Sleep apnea    Type II diabetes mellitus ([place])     Patient Active Problem List   Diagnosis Date Noted   Left leg cellulitis 04/11/2021   Cellulitis of left leg 03/05/2021   Cellulitis 02/18/2021   PAD (peripheral artery disease) ([place]) 10/10/2020   Osteomyelitis of fourth toe of right foot (HCC)    S/P CABG x 4 07/24/2020   Non-ST elevation (NSTEMI) myocardial infarction ([place]) 07/22/2020   Acute on chronic  diastolic heart failure ([place]) 07/22/2020   Hypotension 07/18/2020   Left leg swelling 07/18/2020   Elevated troponin 07/18/2020   AKI (acute kidney injury) ([place]) 07/18/2020   Elevated brain natriuretic peptide (BNP) level 07/18/2020   GERD (gastroesophageal reflux disease) 07/18/2020   Severe Sepsis secondary to Lt leg/Foot Cellulitis 07/18/2020   Severe sepsis --Severe Sepsis secondary to Lt leg/Foot Cellulitis with hypotension and AKI 07/18/2020   Unstable angina ([place]) 11/01/2019   Edema 06/13/2019   Leukocytosis 05/31/2019   Smoker 03/08/2019   Chronic pain 03/08/2019   Chronic back pain 02/16/2019   Deep venous thrombosis ([place]) 02/16/2019   Obesity, Class III, BMI 40-49.9 (morbid obesity) ([place]) 02/16/2019   Peripheral nerve disease 02/16/2019   COPD (chronic obstructive pulmonary disease) ([place]) 01/22/2019   Anxiety disorder 03/26/2018   Open wound of toe 10/12/2017   Sinusitis 10/12/2017   Abdominal pain    Loss of weight    Diabetic foot infection ([place]) 09/16/2017   Uncontrolled type 2 diabetes mellitus with hyperglycemia, with long-term current use of insulin ([place]) 09/16/2017   Cellulitis of right foot    Chronic ulcer of great toe of right foot ([place]) 09/09/2017   Recurrent chest pain 07/29/2016   Hyperglycemia  Insulin dependent type 2 diabetes mellitus ([place]) 04/29/2015   OSA (obstructive sleep apnea) 05/08/2013   History of pulmonary embolus (PE) 04/27/2013   CAD -S/P PCI 11/02/19  09/10/2008   Dyslipidemia 09/09/2008    Past Surgical History:  Procedure Laterality Date   ABDOMINAL AORTOGRAM W/LOWER EXTREMITY N/A 10/10/2020   Procedure: ABDOMINAL AORTOGRAM W/LOWER EXTREMITY;  Surgeon: Elam Dutch, MD;  Location: [place] [facility];  Service: Cardiovascular;  Laterality: N/A;   AMPUTATION Right 09/21/2017   Procedure: RIGHT GREAT TOE AMPUTATION, POSSIBLE VAC;  Surgeon: Leandrew Koyanagi, MD;  Location: [place];  Service: Orthopedics;  Laterality: Right;   AMPUTATION  Left 08/13/2020   Procedure: LEFT FOOT 4TH RAY AMPUTATION;  Surgeon: Newt Minion, MD;  Location: [place];  Service: Orthopedics;  Laterality: Left;   AORTOGRAM Bilateral 03/13/2021   Procedure: ABDOMINAL AORTOGRAM WITH Left LOWER EXTREMITY RUNOFF;  Surgeon: Cherre Robins, MD;  Location: [facility] OR;  Service: Vascular;  Laterality: Bilateral;   CARDIAC CATHETERIZATION  2006   [place]   "@ [facility]; when I had my heart attack"   CARDIAC CATHETERIZATION N/A 07/29/2016   Procedure: Left Heart Cath and Coronary Angiography;  Surgeon: Lorretta Harp, MD;  Location: [place] [facility];  Service: Cardiovascular;  Laterality: N/A;   CARDIAC CATHETERIZATION N/A 07/29/2016   Procedure: Coronary Stent Intervention;  Surgeon: Lorretta Harp, MD;  Location: [place] [facility];  Service: Cardiovascular;  Laterality: N/A;   CARPAL TUNNEL RELEASE Bilateral    CORONARY ANGIOPLASTY WITH STENT PLACEMENT  07/29/2016   CORONARY ARTERY BYPASS GRAFT N/A 07/24/2020   Procedure: CORONARY ARTERY BYPASS GRAFTING (CABG), ON PUMP, TIMES FOUR, USING LEFT INTERNAL MAMMARY ARTERY AND ENDOSCOPICALLY HARVESTED RIGHT GREATER SAPHENOUS VEIN;  Surgeon: Lajuana Matte, MD;  Location: [place];  Service: Open Heart Surgery;  Laterality: N/A;  FLOW TAC   CORONARY STENT INTERVENTION N/A 05/30/2019   Procedure: CORONARY STENT INTERVENTION;  Surgeon: Belva Crome, MD;  Location: [place] [facility];  Service: Cardiovascular;  Laterality: N/A;   CORONARY STENT INTERVENTION N/A 11/02/2019   Procedure: CORONARY STENT INTERVENTION;  Surgeon: Wellington Hampshire, MD;  Location: [place] [facility];  Service: Cardiovascular;  Laterality: N/A;   ESOPHAGOGASTRODUODENOSCOPY N/A 09/22/2017   Procedure: ESOPHAGOGASTRODUODENOSCOPY (EGD);  Surgeon: Ladene Artist, MD;  Location: [facility]OSCOPY;  Service: Endoscopy;  Laterality: N/A;   INTRAVASCULAR PRESSURE WIRE/FFR STUDY N/A 07/22/2020   Procedure: INTRAVASCULAR PRESSURE WIRE/FFR STUDY;   Surgeon: Leonie Man, MD;  Location: [place] [facility];  Service: Cardiovascular;  Laterality: N/A;   KNEE ARTHROSCOPY Bilateral    "2 on left; 1 on the right"   LEFT HEART CATH AND CORONARY ANGIOGRAPHY N/A 11/02/2019   Procedure: LEFT HEART CATH AND CORONARY ANGIOGRAPHY;  Surgeon: Wellington Hampshire, MD;  Location: [place] [facility];  Service: Cardiovascular;  Laterality: N/A;   LEFT HEART CATH AND CORONARY ANGIOGRAPHY N/A 07/22/2020   Procedure: LEFT HEART CATH AND CORONARY ANGIOGRAPHY;  Surgeon: Leonie Man, MD;  Location: [place] [facility];  Service: Cardiovascular;  Laterality: N/A;   PERIPHERAL VASCULAR INTERVENTION Left 10/11/2020   popliteal and SFA stent placement    PERIPHERAL VASCULAR INTERVENTION Left 10/10/2020   Procedure: PERIPHERAL VASCULAR INTERVENTION;  Surgeon: Elam Dutch, MD;  Location: [place] [facility];  Service: Cardiovascular;  Laterality: Left;   RIGHT/LEFT HEART CATH AND CORONARY ANGIOGRAPHY N/A 05/30/2019   Procedure: RIGHT/LEFT HEART CATH AND CORONARY ANGIOGRAPHY;  Surgeon: Belva Crome, MD;  Location: [place] [facility];  Service: Cardiovascular;  Laterality: N/A;   SHOULDER OPEN ROTATOR CUFF REPAIR Bilateral    TEE WITHOUT CARDIOVERSION N/A 07/24/2020   Procedure: TRANSESOPHAGEAL ECHOCARDIOGRAM (TEE);  Surgeon: Lajuana Matte, MD;  Location: [place];  Service: Open Heart Surgery;  Laterality: N/A;       Family History  Problem Relation Age of Onset   Hypertension Brother    Hypertension Father    Diabetes Other    Hyperlipidemia Other     Social History   Tobacco Use   Smoking status: Former    Packs/day: 1.00    Years: 34.00    Pack years: 34.00    Types: Cigarettes    Quit date: 07/12/2020    Years since quitting: 0.9   Smokeless tobacco: Former    Types: Snuff, Chew  Vaping Use   Vaping Use: Never used  Substance Use Topics   Alcohol use: Yes    Comment: RARE   Drug use: No    Home Medications Prior to Admission  medications   Medication Sig Start Date End Date Taking? Authorizing Provider  albuterol (VENTOLIN HFA) 108 (90 Base) MCG/ACT inhaler Inhale 2 puffs into the lungs every 6 (six) hours as needed for wheezing or shortness of breath.    [provider]  ALPRAZolam Duanne Moron) 1 MG tablet Take 1 mg by mouth See admin instructions. 1 tablet by mouth twice daily and 2 tabs at bedtime.    [provider]  aspirin EC 81 MG tablet Take 1 tablet (81 mg total) by mouth daily. Swallow whole. 03/14/21   Jeralyn Bennett, MD  atorvastatin (LIPITOR) 80 MG tablet Take 1 tablet (80 mg total) by mouth daily at 6 PM. 05/31/19   Sande Rives E, PA-C  blood glucose meter kit and supplies KIT Dispense based on patient and insurance preference. Use up to four times daily as directed. (FOR ICD-9 250.00, 250.01). 09/23/17   Hongalgi, Lenis Dickinson, MD  buPROPion (WELLBUTRIN SR) 150 MG 12 hr tablet Take 150 mg by mouth 2 (two) times daily.  08/17/16   [provider]  ciprofloxacin (CIPRO) 500 MG tablet TAKE 1 TABLET(500 MG) BY MOUTH TWICE DAILY 06/09/21   Carlyle Basques, MD  clopidogrel (PLAVIX) 75 MG tablet Take 1 tablet (75 mg total) by mouth daily with breakfast. 10/11/20   Ulyses Amor, PA-C  Continuous Blood Gluc Sensor (FREESTYLE LIBRE 14 DAY SENSOR) MISC Inject 1 patch into the skin every 14 (fourteen) days.    [provider]  doxycycline (VIBRA-TABS) 100 MG tablet Take 1 tablet (100 mg total) by mouth 2 (two) times daily. Take on full stomach. Wear sunscreen 05/06/21   Carlyle Basques, MD  escitalopram (LEXAPRO) 10 MG tablet Take 10 mg by mouth in the morning. 06/10/20   [provider]  fenofibrate (TRICOR) 145 MG tablet Take 145 mg by mouth in the morning.    [provider]  fluconazole (DIFLUCAN) 150 MG tablet Take 150 mg by mouth every Monday.    [provider]  fluticasone (FLONASE) 50 MCG/ACT nasal spray Place 2 sprays into both nostrils in the morning.  03/14/19   [provider]  Fluticasone Propionate, Inhal, (FLOVENT DISKUS) 250 MCG/BLIST AEPB Inhale 2 puffs into the lungs in the morning and at bedtime.    [provider]  furosemide (LASIX) 40 MG tablet Take 1 tablet (40 mg total) by mouth daily. 05/13/21 08/11/21  Skeet Latch,  MD  insulin lispro (HUMALOG KWIKPEN) 100 UNIT/ML KwikPen Inject 12 Units into the skin 3 (three) times daily. Patient taking differently: Inject 0-12 Units into the skin 3 (three) times daily. Sliding Scale 03/14/21   Jeralyn Bennett, MD  LANTUS SOLOSTAR 100 UNIT/ML Solostar Pen Inject 46 Units into the skin daily before breakfast AND 52 Units at bedtime. 03/14/21   Jeralyn Bennett, MD  lidocaine (LIDODERM) 5 % Place 1 patch onto the skin daily. Remove & Discard patch within 12 hours or as directed by MD 04/15/21   Manuella Ghazi, Pratik D, DO  meclizine (ANTIVERT) 25 MG tablet Take 25 mg by mouth 3 (three) times daily as needed for dizziness. Take 50 mg by mouth in the morning & 25 mg by mouth at night 04/10/13   [provider]  metFORMIN (GLUCOPHAGE) 1000 MG tablet Take 1 tablet (1,000 mg total) by mouth 2 (two) times daily with a meal. 11/05/19   Isaiah Serge, NP  midodrine (PROAMATINE) 5 MG tablet Take 5 mg by mouth 3 (three) times daily with meals.    [provider]  mupirocin ointment (BACTROBAN) 2 % APPLY TOPICALLY TO THE AFFECTED AREA DAILY 06/09/21   Carlyle Basques, MD  naloxone [facility]) nasal spray 4 mg/0.1 mL Place 4 mg into the nose as needed (overdose).    [provider]  nitroGLYCERIN (NITROSTAT) 0.4 MG SL tablet Place 0.4 mg under the tongue every 5 (five) minutes as needed for chest pain.    [provider]  oxyCODONE (ROXICODONE) 15 MG immediate release tablet Take 1 tablet (15 mg total) by mouth 5 (five) times daily. 03/14/21   Jeralyn Bennett, MD  pantoprazole (PROTONIX) 40 MG tablet Take 1 tablet (40 mg total) by mouth daily. Patient taking differently:  Take 80 mg by mouth daily before breakfast. 09/23/17   Hongalgi, Lenis Dickinson, MD  pregabalin (LYRICA) 150 MG capsule Take 150 mg by mouth 3 (three) times daily. 05/06/19   [provider]  promethazine (PHENERGAN) 25 MG tablet Take 25 mg by mouth every 4 (four) hours as needed for nausea or vomiting.    [provider]  VICTOZA 18 MG/3ML SOPN Inject 1.2 mg into the skin at bedtime. 04/30/19   [provider]    Allergies    Sulfa antibiotics  Review of Systems   Review of Systems  Cardiovascular:  Positive for chest pain.  Gastrointestinal:  Positive for abdominal pain.  Musculoskeletal:        Left lower leg pain  Skin:  Positive for color change.  All other systems reviewed and are negative.  Physical Exam Updated Vital Signs BP 96/82 (BP Location: Left Arm)   Pulse 93   Temp 99.1 F (37.3 C) (Oral)   Resp 20   Ht 6' 5" (1.956 m)   Wt (!) 158.8 kg   SpO2 97%   BMI 41.50 kg/m   Physical Exam Vitals and nursing note reviewed.  Constitutional:      Appearance: He is well-developed. He is obese.  HENT:     Head: Normocephalic and atraumatic.     Mouth/Throat:     Mouth: Mucous membranes are moist.     Pharynx: Oropharynx is clear.  Eyes:     Extraocular Movements: Extraocular movements intact.     Pupils: Pupils are equal, round, and reactive to light.  Cardiovascular:     Rate and Rhythm: Normal rate and regular rhythm.     Heart sounds: Normal heart sounds.  Pulmonary:  Effort: Pulmonary effort is normal.     Breath sounds: Normal breath sounds.  Abdominal:     General: Abdomen is flat. Bowel sounds are normal.     Palpations: Abdomen is soft.     Tenderness: There is abdominal tenderness in the left upper quadrant.  Skin:    General: Skin is warm.     Capillary Refill: Capillary refill takes less than 2 seconds.     Comments: LLE Cellulitis  Neurological:     General: No focal deficit present.     Mental Status: He is alert and  oriented to person, place, and time.  Psychiatric:        Mood and Affect: Mood normal.        Behavior: Behavior normal.    ED Results / Procedures / Treatments   Labs (all labs ordered are listed, but only abnormal results are displayed) Labs Reviewed  CBC WITH DIFFERENTIAL/PLATELET - Abnormal; Notable for the following components:      Result Value   Hemoglobin 11.8 (*)    HCT 36.7 (*)    All other components within normal limits  COMPREHENSIVE METABOLIC PANEL - Abnormal; Notable for the following components:   Sodium 125 (*)    Chloride 90 (*)    Glucose, Bld 821 (*)    Creatinine, Ser 1.72 (*)    Calcium 7.9 (*)    Albumin 2.7 (*)    GFR, Estimated 48 (*)    All other components within normal limits  LACTIC ACID, PLASMA - Abnormal; Notable for the following components:   Lactic Acid, Venous 6.7 (*)    All other components within normal limits  TROPONIN I (HIGH SENSITIVITY) - Abnormal; Notable for the following components:   Troponin I (High Sensitivity) 18 (*)    All other components within normal limits  CULTURE, BLOOD (ROUTINE X 2)  CULTURE, BLOOD (ROUTINE X 2)  RESP PANEL BY RT-PCR (FLU A&B, COVID) ARPGX2  LIPASE, BLOOD  URINALYSIS, ROUTINE W REFLEX MICROSCOPIC  LACTIC ACID, PLASMA  TROPONIN I (HIGH SENSITIVITY)    EKG EKG Interpretation  Date/Time:  Thursday July 09 2021 20:50:37 EDT Ventricular Rate:  98 PR Interval:  184 QRS Duration: 126 QT Interval:  368 QTC Calculation: 469 R Axis:   -49 Text Interpretation: Normal sinus rhythm Left axis deviation Non-specific intra-ventricular conduction block Inferior infarct , age undetermined Possible Anterolateral infarct , age undetermined Abnormal ECG No significant change since last tracing Confirmed by Isla Pence [phone]) on 07/09/2021 9:10:31 PM  Radiology DG Chest 2 View  Result Date: 07/09/2021 CLINICAL DATA:  Chest pain EXAM: CHEST - 2 VIEW COMPARISON:  04/11/2021 FINDINGS: Lungs are clear. No  pneumothorax or pleural effusion. Coronary artery bypass grafting has been performed. Cardiac size within normal limits. No acute bone abnormality. IMPRESSION: No active cardiopulmonary disease. Electronically Signed   By: Fidela Salisbury MD   On: 07/09/2021 22:24    Procedures Procedures   Medications Ordered in ED Medications  insulin regular, human (MYXREDLIN) 100 units/ 100 mL infusion (has no administration in time range)  lactated ringers infusion (has no administration in time range)  dextrose 5 % in lactated ringers infusion (has no administration in time range)  dextrose 50 % solution 0-50 mL (has no administration in time range)  lactated ringers bolus 3,176 mL (has no administration in time range)  vancomycin (VANCOREADY) IVPB 2000 mg/400 mL (has no administration in time range)  ondansetron (ZOFRAN) injection 4 mg (4 mg Intravenous Given 07/09/21  2203)  piperacillin-tazobactam (ZOSYN) IVPB 3.375 g (3.375 g Intravenous New Bag/Given 07/09/21 2204)    ED Course  I have reviewed the triage vital signs and the nursing notes.  Pertinent labs & imaging results that were available during my care of the patient were reviewed by me and considered in my medical decision making (see chart for details).    MDM Rules/Calculators/A&P                           Pt's bp initially ok.  However, it dropped and lactic was elevated.  This plus source of infection caused me to call a code sepsis.  IVFs and IV zosyn and vancomycin were ordered.  Pt's blood sugar is 821.  Insulin drip ordered.  He is not in DKA.  Pt has a large drink at his bedside.  I asked him what it is and he said it was an orange slushie.  He said he bought it because he does not have AC at home.  He is told to not drink any more of the slushie.  Pt d/w Dr. Clearence Ped (triad) for admission.  CRITICAL CARE Performed by: Isla Pence   Total critical care time: 45 minutes  Critical care time was exclusive of separately  billable procedures and treating other patients.  Critical care was necessary to treat or prevent imminent or life-threatening deterioration.  Critical care was time spent personally by me on the following activities: development of treatment plan with patient and/or surrogate as well as nursing, discussions with consultants, evaluation of patient's response to treatment, examination of patient, obtaining history from patient or surrogate, ordering and performing treatments and interventions, ordering and review of laboratory studies, ordering and review of radiographic studies, pulse oximetry and re-evaluation of patient's condition.   Final Clinical Impression(s) / ED Diagnoses Final diagnoses:  Cellulitis of left lower extremity  Poorly controlled diabetes mellitus ([place])  Hyperglycemia  Sepsis without acute organ dysfunction, due to unspecified organism [facility])    Rx / [place] Orders ED Discharge Orders     None        Isla Pence, MD 07/09/21 2321

## 2021-07-09 NOTE — ED Notes (Signed)
Patient reports he ambulated to the bathroom and failed to collect a urine specimen. Reinforced to the patient to collect a urine specimen on next urination.

## 2021-07-09 NOTE — ED Notes (Signed)
Patient transported to X-ray 

## 2021-07-09 NOTE — Progress Notes (Signed)
Pharmacy Antibiotic Note  Joseph [name] is a 50 y.o. male admitted on 07/09/2021 with cellulitis.  Pharmacy has been consulted for vancomycin dosing. Cr pending, Zosyn x1 ordered per MD.  Plan: Vancomycin 2500mg  x1 F/U Cr for further doses Zosyn per MD   Height: 6\' 5"  (195.6 cm) Weight: (!) 158.8 kg (350 lb) IBW/kg (Calculated) : 89.1  Temp (24hrs), Avg:99.1 F (37.3 C), Min:99.1 F (37.3 C), Max:99.1 F (37.3 C)  No results for input(s): WBC, CREATININE, LATICACIDVEN, VANCOTROUGH, VANCOPEAK, VANCORANDOM, GENTTROUGH, GENTPEAK, GENTRANDOM, TOBRATROUGH, TOBRAPEAK, TOBRARND, AMIKACINPEAK, AMIKACINTROU, AMIKACIN in the last 168 hours.  CrCl cannot be calculated (Patient's most recent lab result is older than the maximum 21 days allowed.).    Allergies  Allergen Reactions   Sulfa Antibiotics Other (See Comments)    Headaches      , PharmD, BCPS, [facility] Clinical Pharmacist Please check AMION for all [facility] Pharmacy numbers 07/09/2021

## 2021-07-09 NOTE — Progress Notes (Signed)
Pharmacy Antibiotic Note  Joseph [name] is a 50 y.o. male admitted on 07/09/2021 with cellulitis.  Pharmacy has been consulted for vancomycin dosing.  Plan: Vancomycin 2000mg  IV Q24H. Goal AUC 400-550.  Expected AUC 420.  SCr 1.72.   Height: 6\' 5"  (195.6 cm) Weight: (!) 158.8 kg (350 lb) IBW/kg (Calculated) : 89.1  Temp (24hrs), Avg:99.1 F (37.3 C), Min:99.1 F (37.3 C), Max:99.1 F (37.3 C)  Recent Labs  Lab 07/09/21 2138  WBC 7.8  CREATININE 1.72*  LATICACIDVEN 6.7*    Estimated Creatinine Clearance: 86.9 mL/min (A) (by C-G formula based on SCr of 1.72 mg/dL (H)).    Allergies  Allergen Reactions   Sulfa Antibiotics Other (See Comments)    Headaches      Thank you for allowing pharmacy to be a part of this patient's care.  07/11/21, PharmD, BCPS  07/09/2021 10:54 PM

## 2021-07-09 NOTE — ED Notes (Signed)
Urinal placed within reach of patient with instructions to collect urine specimen as soon as possible. Patient verbalized understanding.

## 2021-07-09 NOTE — Sepsis Progress Note (Signed)
Monitoring for code sepsis protocol. 

## 2021-07-10 ENCOUNTER — Ambulatory Visit: Payer: Medicare HMO | Admitting: Physician Assistant

## 2021-07-10 ENCOUNTER — Encounter (HOSPITAL_COMMUNITY): Payer: Self-pay | Admitting: Family Medicine

## 2021-07-10 DIAGNOSIS — Z794 Long term (current) use of insulin: Secondary | ICD-10-CM

## 2021-07-10 DIAGNOSIS — R739 Hyperglycemia, unspecified: Secondary | ICD-10-CM

## 2021-07-10 DIAGNOSIS — A419 Sepsis, unspecified organism: Principal | ICD-10-CM

## 2021-07-10 DIAGNOSIS — R6520 Severe sepsis without septic shock: Secondary | ICD-10-CM

## 2021-07-10 DIAGNOSIS — L03116 Cellulitis of left lower limb: Secondary | ICD-10-CM

## 2021-07-10 DIAGNOSIS — E119 Type 2 diabetes mellitus without complications: Secondary | ICD-10-CM | POA: Diagnosis not present

## 2021-07-10 LAB — CBG MONITORING, ED
Glucose-Capillary: >600 mg/dL (ref 70–99)
Glucose-Capillary: 559 mg/dL (ref 70–99)
Glucose-Capillary: >600 mg/dL (ref 70–99)
Glucose-Capillary: 466 mg/dL — ABNORMAL HIGH (ref 70–99)
Glucose-Capillary: 350 mg/dL — ABNORMAL HIGH (ref 70–99)
Glucose-Capillary: 373 mg/dL — ABNORMAL HIGH (ref 70–99)

## 2021-07-10 LAB — URINALYSIS, ROUTINE W REFLEX MICROSCOPIC
Bacteria, UA: NONE SEEN
Bilirubin Urine: NEGATIVE
Glucose, UA: >=500 mg/dL — AB
Ketones, ur: NEGATIVE mg/dL
Leukocytes,Ua: NEGATIVE
Nitrite: NEGATIVE
Protein, ur: 30 mg/dL — AB
Specific Gravity, Urine: 1.017 (ref 1.005–1.030)
pH: 5 (ref 5.0–8.0)

## 2021-07-10 LAB — BASIC METABOLIC PANEL
Anion gap: 10 (ref 5–15)
BUN: 18 mg/dL (ref 6–20)
CO2: 25 mmol/L (ref 22–32)
Calcium: 8.4 mg/dL — ABNORMAL LOW (ref 8.9–10.3)
Chloride: 99 mmol/L (ref 98–111)
Creatinine, Ser: 1.44 mg/dL — ABNORMAL HIGH (ref 0.61–1.24)
GFR, Estimated: 60 mL/min — ABNORMAL LOW (ref >60)
Glucose, Bld: 328 mg/dL — ABNORMAL HIGH (ref 70–99)
Potassium: 3.4 mmol/L — ABNORMAL LOW (ref 3.5–5.1)
Sodium: 134 mmol/L — ABNORMAL LOW (ref 135–145)

## 2021-07-10 LAB — GLUCOSE, CAPILLARY
Glucose-Capillary: 305 mg/dL — ABNORMAL HIGH (ref 70–99)
Glucose-Capillary: 209 mg/dL — ABNORMAL HIGH (ref 70–99)
Glucose-Capillary: 227 mg/dL — ABNORMAL HIGH (ref 70–99)
Glucose-Capillary: 195 mg/dL — ABNORMAL HIGH (ref 70–99)
Glucose-Capillary: 222 mg/dL — ABNORMAL HIGH (ref 70–99)
Glucose-Capillary: 250 mg/dL — ABNORMAL HIGH (ref 70–99)
Glucose-Capillary: 230 mg/dL — ABNORMAL HIGH (ref 70–99)
Glucose-Capillary: 168 mg/dL — ABNORMAL HIGH (ref 70–99)
Glucose-Capillary: 158 mg/dL — ABNORMAL HIGH (ref 70–99)
Glucose-Capillary: 106 mg/dL — ABNORMAL HIGH (ref 70–99)
Glucose-Capillary: 229 mg/dL — ABNORMAL HIGH (ref 70–99)

## 2021-07-10 LAB — RESP PANEL BY RT-PCR (FLU A&B, COVID) ARPGX2
Influenza A by PCR: NEGATIVE
Influenza B by PCR: NEGATIVE
SARS Coronavirus 2 by RT PCR: NEGATIVE

## 2021-07-10 LAB — HEMOGLOBIN A1C
Hgb A1c MFr Bld: 13.7 % — ABNORMAL HIGH (ref 4.8–5.6)
Mean Plasma Glucose: 346 mg/dL

## 2021-07-10 LAB — LACTIC ACID, PLASMA
Lactic Acid, Venous: 5 mmol/L (ref 0.5–1.9)
Lactic Acid, Venous: 3.7 mmol/L (ref 0.5–1.9)

## 2021-07-10 LAB — MAGNESIUM: Magnesium: 1.5 mg/dL — ABNORMAL LOW (ref 1.7–2.4)

## 2021-07-10 LAB — TROPONIN I (HIGH SENSITIVITY): Troponin I (High Sensitivity): 17 ng/L (ref <18)

## 2021-07-10 MED ORDER — MIDODRINE HCL 5 MG PO TABS
5.0000 mg | ORAL_TABLET | Freq: Three times a day (TID) | ORAL | Status: DC
  Administered 2021-07-11 – 2021-07-13 (×5): 5 mg via ORAL
  Filled 2021-07-10 (×7): qty 1

## 2021-07-10 MED ORDER — MAGNESIUM SULFATE 2 GM/50ML IV SOLN
2.0000 g | Freq: Once | INTRAVENOUS | Status: AC
  Administered 2021-07-10: 2 g via INTRAVENOUS
  Filled 2021-07-10: qty 50

## 2021-07-10 MED ORDER — NITROGLYCERIN 0.4 MG SL SUBL
0.4000 mg | SUBLINGUAL_TABLET | SUBLINGUAL | Status: DC | PRN
  Administered 2021-07-10: 0.4 mg via SUBLINGUAL
  Filled 2021-07-10: qty 1

## 2021-07-10 MED ORDER — VANCOMYCIN HCL 2000 MG/400ML IV SOLN
2000.0000 mg | Freq: Two times a day (BID) | INTRAVENOUS | Status: DC
  Administered 2021-07-10 – 2021-07-13 (×7): 2000 mg via INTRAVENOUS
  Filled 2021-07-10 (×10): qty 400

## 2021-07-10 MED ORDER — ALPRAZOLAM 0.5 MG PO TABS: 1.0000 mg | ORAL_TABLET | ORAL | Status: DC

## 2021-07-10 MED ORDER — HEPARIN SODIUM (PORCINE) 5000 UNIT/ML IJ SOLN
5000.0000 [IU] | Freq: Three times a day (TID) | INTRAMUSCULAR | Status: DC
  Administered 2021-07-10 – 2021-07-13 (×11): 5000 [IU] via SUBCUTANEOUS
  Filled 2021-07-10 (×11): qty 1

## 2021-07-10 MED ORDER — ASPIRIN EC 81 MG PO TBEC
81.0000 mg | DELAYED_RELEASE_TABLET | Freq: Every day | ORAL | Status: DC
  Administered 2021-07-10 – 2021-07-13 (×4): 81 mg via ORAL
  Filled 2021-07-10 (×4): qty 1

## 2021-07-10 MED ORDER — LACTATED RINGERS IV SOLN: INTRAVENOUS | Status: DC

## 2021-07-10 MED ORDER — OXYCODONE HCL 5 MG PO TABS
15.0000 mg | ORAL_TABLET | Freq: Four times a day (QID) | ORAL | Status: DC | PRN
  Administered 2021-07-10 – 2021-07-11 (×4): 15 mg via ORAL
  Filled 2021-07-10 (×4): qty 3

## 2021-07-10 MED ORDER — ALPRAZOLAM 0.5 MG PO TABS
2.0000 mg | ORAL_TABLET | Freq: Every day | ORAL | Status: DC
  Administered 2021-07-10: 2 mg via ORAL
  Filled 2021-07-10: qty 4

## 2021-07-10 MED ORDER — INSULIN DETEMIR 100 UNIT/ML ~~LOC~~ SOLN
30.0000 [IU] | Freq: Two times a day (BID) | SUBCUTANEOUS | Status: DC
  Administered 2021-07-10 – 2021-07-13 (×6): 30 [IU] via SUBCUTANEOUS
  Filled 2021-07-10 (×8): qty 0.3

## 2021-07-10 MED ORDER — CHLORHEXIDINE GLUCONATE CLOTH 2 % EX PADS
6.0000 | MEDICATED_PAD | Freq: Every day | CUTANEOUS | Status: DC
  Administered 2021-07-10 – 2021-07-13 (×4): 6 via TOPICAL

## 2021-07-10 MED ORDER — FENOFIBRATE 54 MG PO TABS
54.0000 mg | ORAL_TABLET | Freq: Every day | ORAL | Status: DC
  Administered 2021-07-10 – 2021-07-13 (×4): 54 mg via ORAL
  Filled 2021-07-10 (×6): qty 1

## 2021-07-10 MED ORDER — PREGABALIN 75 MG PO CAPS
150.0000 mg | ORAL_CAPSULE | Freq: Three times a day (TID) | ORAL | Status: DC
  Administered 2021-07-10 – 2021-07-13 (×9): 150 mg via ORAL
  Filled 2021-07-10 (×9): qty 2

## 2021-07-10 MED ORDER — ESCITALOPRAM OXALATE 10 MG PO TABS
10.0000 mg | ORAL_TABLET | Freq: Every morning | ORAL | Status: DC
  Administered 2021-07-10 – 2021-07-13 (×4): 10 mg via ORAL
  Filled 2021-07-10 (×4): qty 1

## 2021-07-10 MED ORDER — MORPHINE SULFATE (PF) 2 MG/ML IV SOLN
2.0000 mg | Freq: Once | INTRAVENOUS | Status: AC
  Administered 2021-07-10: 2 mg via INTRAVENOUS
  Filled 2021-07-10: qty 1

## 2021-07-10 MED ORDER — ALPRAZOLAM 0.5 MG PO TABS
1.0000 mg | ORAL_TABLET | Freq: Two times a day (BID) | ORAL | Status: DC
  Administered 2021-07-11: 1 mg via ORAL
  Filled 2021-07-10 (×2): qty 2

## 2021-07-10 MED ORDER — PANTOPRAZOLE SODIUM 40 MG PO TBEC
40.0000 mg | DELAYED_RELEASE_TABLET | Freq: Every day | ORAL | Status: DC
  Administered 2021-07-10 – 2021-07-13 (×4): 40 mg via ORAL
  Filled 2021-07-10 (×4): qty 1

## 2021-07-10 MED ORDER — INSULIN ASPART 100 UNIT/ML IJ SOLN
0.0000 [IU] | Freq: Three times a day (TID) | INTRAMUSCULAR | Status: DC
  Administered 2021-07-11 (×2): 15 [IU] via SUBCUTANEOUS
  Administered 2021-07-11: 20 [IU] via SUBCUTANEOUS
  Administered 2021-07-12 (×2): 15 [IU] via SUBCUTANEOUS
  Administered 2021-07-12: 20 [IU] via SUBCUTANEOUS
  Administered 2021-07-13: 11 [IU] via SUBCUTANEOUS
  Administered 2021-07-13: 20 [IU] via SUBCUTANEOUS

## 2021-07-10 MED ORDER — VANCOMYCIN HCL 2000 MG/400ML IV SOLN: 2000.0000 mg | INTRAVENOUS | Status: DC

## 2021-07-10 MED ORDER — CLOPIDOGREL BISULFATE 75 MG PO TABS
75.0000 mg | ORAL_TABLET | Freq: Every day | ORAL | Status: DC
  Administered 2021-07-10 – 2021-07-13 (×4): 75 mg via ORAL
  Filled 2021-07-10 (×4): qty 1

## 2021-07-10 MED ORDER — BUPROPION HCL ER (SR) 150 MG PO TB12
150.0000 mg | ORAL_TABLET | Freq: Two times a day (BID) | ORAL | Status: DC
  Administered 2021-07-10 – 2021-07-13 (×7): 150 mg via ORAL
  Filled 2021-07-10 (×8): qty 1

## 2021-07-10 MED ORDER — MORPHINE SULFATE (PF) 2 MG/ML IV SOLN
2.0000 mg | INTRAVENOUS | Status: DC | PRN
Start: 2021-07-10 — End: 2021-07-11

## 2021-07-10 MED ORDER — INSULIN ASPART 100 UNIT/ML IJ SOLN
6.0000 [IU] | Freq: Three times a day (TID) | INTRAMUSCULAR | Status: DC
  Administered 2021-07-11 – 2021-07-13 (×7): 6 [IU] via SUBCUTANEOUS

## 2021-07-10 MED ORDER — INSULIN ASPART 100 UNIT/ML IJ SOLN
0.0000 [IU] | Freq: Every day | INTRAMUSCULAR | Status: DC
  Administered 2021-07-10: 2 [IU] via SUBCUTANEOUS
  Administered 2021-07-11: 3 [IU] via SUBCUTANEOUS
  Administered 2021-07-12: 4 [IU] via SUBCUTANEOUS

## 2021-07-10 MED ORDER — ALBUTEROL SULFATE HFA 108 (90 BASE) MCG/ACT IN AERS: 2.0000 | INHALATION_SPRAY | Freq: Four times a day (QID) | RESPIRATORY_TRACT | Status: DC | PRN

## 2021-07-10 MED ORDER — ATORVASTATIN CALCIUM 40 MG PO TABS
80.0000 mg | ORAL_TABLET | Freq: Every day | ORAL | Status: DC
  Administered 2021-07-10 – 2021-07-12 (×2): 80 mg via ORAL
  Filled 2021-07-10 (×2): qty 2

## 2021-07-10 MED ORDER — RINGERS IV SOLN: INTRAVENOUS | Status: DC

## 2021-07-10 NOTE — ED Notes (Signed)
Per endotool insulin drip rate to remain at 62ml/hr. Next finger stick glucose check is due at 0205.

## 2021-07-10 NOTE — ED Notes (Signed)
[place] sandwich meal and diet soda provided to patient.

## 2021-07-10 NOTE — Progress Notes (Addendum)
Inpatient Diabetes Program Recommendations  AACE/ADA: New Consensus Statement on Inpatient Glycemic Control (2015)  Target Ranges:  Prepandial:   less than 140 mg/dL      Peak postprandial:   less than 180 mg/dL (1-2 hours)      Critically ill patients:  140 - 180 mg/dL   Lab Results  Component Value Date   GLUCAP 222 (H) 07/10/2021   HGBA1C 9.9 (H) 02/18/2021    Review of Glycemic Control Results for Joseph [name], Joseph [name] (MRN [id number]) as of 07/10/2021 10:49  Ref. Range 07/10/2021 06:07 07/10/2021 07:22 07/10/2021 08:34 07/10/2021 09:35 07/10/2021 10:48  Glucose-Capillary Latest Ref Range: 70 - 99 mg/dL 782 (H) 423 (H) 536 (H) 195 (H) 222 (H)   Diabetes history: DM 2 Outpatient Diabetes medications:  Lantus 46 units with breakfast and Lantus 52 units at bedtime Humalog 12 units tid with meals Metformin 1000 mg bid Current orders for Inpatient glycemic control:  IV insulin  Inpatient Diabetes Program Recommendations:    Upon transition off insulin drip, consider Levemir 40 units bid, Novolog 10 units tid with meals, and Novolog resistant tid with meals and HS.   Will call patient today to discuss possible barriers to glycemic control.   Thanks,  Beryl Meager, RN, BC-ADM Inpatient Diabetes Coordinator Pager [phone] (8a-5p)   Addendum [phone]- Spoke with patient regarding possible barriers to DM management.  He states that his insurance stopped covering his Humalog so he has not taking in over a month.  He has been taking Lantus b/c he had a friend give him insulin that he was no longer using.  He however states that he is at the "end of the Lantus".  Looked up in Epic, and it appears that Novolog and Levemir are preferred with [employer].  Therefore at d/c will need d/c prescriptions for Levemir bid, and Novolog.  Discussed at length with patient and told him to always call PCP when insurance denies a medication so that prescription can be updated.  Thanks so much.   Thanks,   Beryl Meager, RN, BC-ADM Inpatient Diabetes Coordinator Pager [phone]  (8a-5p)

## 2021-07-10 NOTE — H&P (Signed)
TRH H&P    Patient Demographics:    Joseph [name], is a 50 y.o. male  MRN: [id number]  DOB - 1971/08/08  Admit Date - 07/09/2021  Referring MD/NP/PA: Gilford Raid  Outpatient Primary MD for the patient is [name], NP  Patient coming from: Home  Chief complaint- Body pains   HPI:    Joseph [name]  is a 50 y.o. male,with history of anxiety, asthma, CAD, chronic bronchitis, COPD, HLD, HTN, MI, T2DM, and more presents to the ED with a chief complaint of body pains. He reports that he has right shoulder pain, left rib pain, and erythema of the left lower extremity.  Patient reports that out of these 3 complaints he is most concerned with the left rib pain.  He reports that it is relieved by nitro, but he does not like to take nitro because it gives him a headache.  He reports that the pain started 2.5-3 days ago.  He reports he cannot remember the details 100% as he has had memory issues since his CABG almost a year ago.  He has not had any falls on his ribs, no heavy lifting.  He reports that the pain radiates all the way across to his chest past midline and over to the right.  He describes the pain as a squeezing and sharp pain like somebody is grabbing him who has sharp fingernails.  The pain is constant, worse with deep inspiration.  Laying on his left side helps relieve the pain.  When discussing his leg he is less concerned.  He reports that he has had cellulitis of the left lower extremity for almost 2 years.  He reports he has been on antibiotics constantly, but most recently was cefdinir.  He is currently 5 to 6 days into the cefdinir course.  He denies any fevers.  He does have nausea and vomiting without hematemesis.  Patient reports that there is no drainage from his left lower extremity but that it has been more erythematous than normal.  Patient reports he has not been following his sugars at home because  he did not have a meter.  He does take Lantus 60 units twice daily and a sliding scale of Humalog.  Patient does not smoke, drinks occasionally, does not use illicit drugs.  He is not vaccinated for COVID.  Patient is full code.  In the ED Patient was initially called code sepsis for a heart rate of 100, soft BPs, cellulitis source, elevated lactic acid 6.7, and AKI. Patient was started on Vanco and Zosyn initially but this was changed to Vanco cefepime at admission Patient was given a fluid bolus based on ideal body weight Chest x-ray showed no active cardiopulmonary disease Blood cultures pending Troponins downtrending and normal at 18 and 17 Patient's glucose was found to be 821 with a bicarb of 22 and a gap of 10 Patient was started on insulin drip in the ED Admission was requested for further management of hyperglycemia and sepsis secondary to cellulitis   Review of systems:    In  addition to the HPI above,  No Fever-chills, Admits to headaches, No changes with Vision or hearing, No problems swallowing food or Liquids, Admits to left-sided rib pain, no cough or Shortness of Breath, No Abdominal pain, admits to nausea and vomiting, bowel movements are regular, No Blood in stool or Urine, No dysuria, Admits to worsening erythema No new joints pains-aches,  No new weakness, tingling, numbness in any extremity, No recent weight gain or loss, No polyuria, polydypsia or polyphagia, No significant Mental Stressors.  All other systems reviewed and are negative.    Past History of the following :    Past Medical History:  Diagnosis Date   Anxiety    Arthritis    "knees, shoulders, hips, ankles" (07/29/2016)   Asthma    CAD (coronary artery disease)    a. 2017: s/p BMS to distal Cx; b. LHC 05/30/2019: 80% mid, distal RCA s/p DES, 30% narrowing of d LM, widely patent LAD w/ luminal irregularities, widely patent stent in [place]  w/ 90+% stenosis distal to stent beofre small  trifurcating obtuse marginal (potentially area of restenosis)   Chronic bronchitis (HCC)    Chronic ulcer of right great toe ([place]) 09/19/2017   COPD (chronic obstructive pulmonary disease) (HCC)    Depression    DVT (deep venous thrombosis) (HCC)    GERD (gastroesophageal reflux disease)    Hyperlipidemia    Hypertension    Myocardial infarction ([place]) 1996   "light one"   PE (pulmonary embolism) 04/2013   On chronic Xarelto   Peripheral nerve disease    Pneumonia "several times"   Sleep apnea    Type II diabetes mellitus ([place])       Past Surgical History:  Procedure Laterality Date   ABDOMINAL AORTOGRAM W/LOWER EXTREMITY N/A 10/10/2020   Procedure: ABDOMINAL AORTOGRAM W/LOWER EXTREMITY;  Surgeon: Elam Dutch, MD;  Location: [facility];  Service: Cardiovascular;  Laterality: N/A;   AMPUTATION Right 09/21/2017   Procedure: RIGHT GREAT TOE AMPUTATION, POSSIBLE VAC;  Surgeon: Leandrew Koyanagi, MD;  Location: [place];  Service: Orthopedics;  Laterality: Right;   AMPUTATION Left 08/13/2020   Procedure: LEFT FOOT 4TH RAY AMPUTATION;  Surgeon: Newt Minion, MD;  Location: [place];  Service: Orthopedics;  Laterality: Left;   AORTOGRAM Bilateral 03/13/2021   Procedure: ABDOMINAL AORTOGRAM WITH Left LOWER EXTREMITY RUNOFF;  Surgeon: Cherre Robins, MD;  Location: [facility];  Service: Vascular;  Laterality: Bilateral;   CARDIAC CATHETERIZATION  2006   [facility]   "@ [facility]; when I had my heart attack"   CARDIAC CATHETERIZATION N/A 07/29/2016   Procedure: Left Heart Cath and Coronary Angiography;  Surgeon: Lorretta Harp, MD;  Location: [facility];  Service: Cardiovascular;  Laterality: N/A;   CARDIAC CATHETERIZATION N/A 07/29/2016   Procedure: Coronary Stent Intervention;  Surgeon: Lorretta Harp, MD;  Location: [place] [facility];  Service: Cardiovascular;  Laterality: N/A;   CARPAL TUNNEL RELEASE Bilateral    CORONARY ANGIOPLASTY WITH STENT PLACEMENT   07/29/2016   CORONARY ARTERY BYPASS GRAFT N/A 07/24/2020   Procedure: CORONARY ARTERY BYPASS GRAFTING (CABG), ON PUMP, TIMES FOUR, USING LEFT INTERNAL MAMMARY ARTERY AND ENDOSCOPICALLY HARVESTED RIGHT GREATER SAPHENOUS VEIN;  Surgeon: Lajuana Matte, MD;  Location: [place];  Service: Open Heart Surgery;  Laterality: N/A;  FLOW TAC   CORONARY STENT INTERVENTION N/A 05/30/2019   Procedure: CORONARY STENT INTERVENTION;  Surgeon: Belva Crome, MD;  Location: [facility];  Service: Cardiovascular;  Laterality: N/A;   CORONARY STENT INTERVENTION N/A 11/02/2019   Procedure: CORONARY STENT INTERVENTION;  Surgeon: Wellington Hampshire, MD;  Location: [facility] [facility];  Service: Cardiovascular;  Laterality: N/A;   ESOPHAGOGASTRODUODENOSCOPY N/A 09/22/2017   Procedure: ESOPHAGOGASTRODUODENOSCOPY (EGD);  Surgeon: Ladene Artist, MD;  Location: [facility];  Service: Endoscopy;  Laterality: N/A;   INTRAVASCULAR PRESSURE WIRE/FFR STUDY N/A 07/22/2020   Procedure: INTRAVASCULAR PRESSURE WIRE/FFR STUDY;  Surgeon: Leonie Man, MD;  Location: [place] [facility];  Service: Cardiovascular;  Laterality: N/A;   KNEE ARTHROSCOPY Bilateral    "2 on left; 1 on the right"   LEFT HEART CATH AND CORONARY ANGIOGRAPHY N/A 11/02/2019   Procedure: LEFT HEART CATH AND CORONARY ANGIOGRAPHY;  Surgeon: Wellington Hampshire, MD;  Location: [place] [facility];  Service: Cardiovascular;  Laterality: N/A;   LEFT HEART CATH AND CORONARY ANGIOGRAPHY N/A 07/22/2020   Procedure: LEFT HEART CATH AND CORONARY ANGIOGRAPHY;  Surgeon: Leonie Man, MD;  Location: [place] [facility];  Service: Cardiovascular;  Laterality: N/A;   PERIPHERAL VASCULAR INTERVENTION Left 10/11/2020   popliteal and SFA stent placement    PERIPHERAL VASCULAR INTERVENTION Left 10/10/2020   Procedure: PERIPHERAL VASCULAR INTERVENTION;  Surgeon: Elam Dutch, MD;  Location: [place] [facility];  Service: Cardiovascular;  Laterality: Left;   RIGHT/LEFT  HEART CATH AND CORONARY ANGIOGRAPHY N/A 05/30/2019   Procedure: RIGHT/LEFT HEART CATH AND CORONARY ANGIOGRAPHY;  Surgeon: Belva Crome, MD;  Location: [place] [facility];  Service: Cardiovascular;  Laterality: N/A;   SHOULDER OPEN ROTATOR CUFF REPAIR Bilateral    TEE WITHOUT CARDIOVERSION N/A 07/24/2020   Procedure: TRANSESOPHAGEAL ECHOCARDIOGRAM (TEE);  Surgeon: Lajuana Matte, MD;  Location: [place];  Service: Open Heart Surgery;  Laterality: N/A;      Social History:      Social History   Tobacco Use   Smoking status: Former    Packs/day: 1.00    Years: 34.00    Pack years: 34.00    Types: Cigarettes    Quit date: 07/12/2020    Years since quitting: 0.9   Smokeless tobacco: Former    Types: Snuff, Chew  Substance Use Topics   Alcohol use: Yes    Comment: RARE       Family History :     Family History  Problem Relation Age of Onset   Hypertension Brother    Hypertension Father    Diabetes Other    Hyperlipidemia Other       Home Medications:   Prior to Admission medications   Medication Sig Start Date End Date Taking? Authorizing Provider  albuterol (VENTOLIN HFA) 108 (90 Base) MCG/ACT inhaler Inhale 2 puffs into the lungs every 6 (six) hours as needed for wheezing or shortness of breath.    [provider]  ALPRAZolam Duanne Moron) 1 MG tablet Take 1 mg by mouth See admin instructions. 1 tablet by mouth twice daily and 2 tabs at bedtime.    [provider]  aspirin EC 81 MG tablet Take 1 tablet (81 mg total) by mouth daily. Swallow whole. 03/14/21   Jeralyn Bennett, MD  atorvastatin (LIPITOR) 80 MG tablet Take 1 tablet (80 mg total) by mouth daily at 6 PM. 05/31/19   Sande Rives E, PA-C  blood glucose meter kit and supplies KIT Dispense based on patient and insurance preference. Use up to four times daily as directed. (FOR ICD-9 250.00, 250.01). 09/23/17   Hongalgi,  Lenis Dickinson, MD  buPROPion [facility] SR) 150 MG 12 hr tablet Take 150 mg by mouth 2  (two) times daily.  08/17/16   [provider]  ciprofloxacin (CIPRO) 500 MG tablet TAKE 1 TABLET(500 MG) BY MOUTH TWICE DAILY 06/09/21   Carlyle Basques, MD  clopidogrel (PLAVIX) 75 MG tablet Take 1 tablet (75 mg total) by mouth daily with breakfast. 10/11/20   Ulyses Amor, PA-C  Continuous Blood Gluc Sensor (FREESTYLE LIBRE 14 DAY SENSOR) MISC Inject 1 patch into the skin every 14 (fourteen) days.    [provider]  doxycycline (VIBRA-TABS) 100 MG tablet Take 1 tablet (100 mg total) by mouth 2 (two) times daily. Take on full stomach. Wear sunscreen 05/06/21   Carlyle Basques, MD  escitalopram (LEXAPRO) 10 MG tablet Take 10 mg by mouth in the morning. 06/10/20   [provider]  fenofibrate (TRICOR) 145 MG tablet Take 145 mg by mouth in the morning.    [provider]  fluconazole (DIFLUCAN) 150 MG tablet Take 150 mg by mouth every Monday.    [provider]  fluticasone (FLONASE) 50 MCG/ACT nasal spray Place 2 sprays into both nostrils in the morning. 03/14/19   [provider]  Fluticasone Propionate, Inhal, (FLOVENT DISKUS) 250 MCG/BLIST AEPB Inhale 2 puffs into the lungs in the morning and at bedtime.    [provider]  furosemide (LASIX) 40 MG tablet Take 1 tablet (40 mg total) by mouth daily. 05/13/21 08/11/21  Skeet Latch, MD  insulin lispro (HUMALOG KWIKPEN) 100 UNIT/ML KwikPen Inject 12 Units into the skin 3 (three) times daily. Patient taking differently: Inject 0-12 Units into the skin 3 (three) times daily. Sliding Scale 03/14/21   Jeralyn Bennett, MD  LANTUS SOLOSTAR 100 UNIT/ML Solostar Pen Inject 46 Units into the skin daily before breakfast AND 52 Units at bedtime. 03/14/21   Jeralyn Bennett, MD  lidocaine (LIDODERM) 5 % Place 1 patch onto the skin daily. Remove & Discard patch within 12 hours or as directed by MD 04/15/21   Manuella Ghazi, Pratik D, DO  meclizine (ANTIVERT) 25 MG tablet Take 25 mg by mouth 3 (three) times daily  as needed for dizziness. Take 50 mg by mouth in the morning & 25 mg by mouth at night 04/10/13   [provider]  metFORMIN (GLUCOPHAGE) 1000 MG tablet Take 1 tablet (1,000 mg total) by mouth 2 (two) times daily with a meal. 11/05/19   Isaiah Serge, NP  midodrine (PROAMATINE) 5 MG tablet Take 5 mg by mouth 3 (three) times daily with meals.    [provider]  mupirocin ointment (BACTROBAN) 2 % APPLY TOPICALLY TO THE AFFECTED AREA DAILY 06/09/21   Carlyle Basques, MD  naloxone [facility]) nasal spray 4 mg/0.1 mL Place 4 mg into the nose as needed (overdose).    [provider]  nitroGLYCERIN (NITROSTAT) 0.4 MG SL tablet Place 0.4 mg under the tongue every 5 (five) minutes as needed for chest pain.    [provider]  oxyCODONE (ROXICODONE) 15 MG immediate release tablet Take 1 tablet (15 mg total) by mouth 5 (five) times daily. 03/14/21   Jeralyn Bennett, MD  pantoprazole (PROTONIX) 40 MG tablet Take 1 tablet (40 mg total) by mouth daily. Patient taking differently: Take 80 mg by mouth daily before breakfast. 09/23/17   Hongalgi, Lenis Dickinson, MD  pregabalin (LYRICA) 150 MG capsule Take 150 mg by mouth 3 (three) times daily. 05/06/19   [provider]  promethazine (  PHENERGAN) 25 MG tablet Take 25 mg by mouth every 4 (four) hours as needed for nausea or vomiting.    [provider]  VICTOZA 18 MG/3ML SOPN Inject 1.2 mg into the skin at bedtime. 04/30/19   [provider]     Allergies:     Allergies  Allergen Reactions   Sulfa Antibiotics Other (See Comments)    Headaches      Physical Exam:   Vitals  Blood pressure 125/72, pulse 85, temperature 98 F (36.7 C), temperature source Oral, resp. rate 20, height 6' 5"  (1.956 m), weight (!) 158.8 kg, SpO2 94 %.  1.  General: Patient lying supine in bed,  no acute distress   2. Psychiatric: Alert and oriented x 3, mood and behavior normal for situation, pleasant and cooperative with  exam   3. Neurologic: Speech and language are normal, face is symmetric, moves all 4 extremities voluntarily, at baseline without acute deficits on limited exam   4. HEENMT:  Head is atraumatic, normocephalic, pupils reactive to light, neck is supple, trachea is midline, mucous membranes are moist   5. Respiratory : Lungs are clear to auscultation bilaterally without wheezing, rhonchi, rales, no cyanosis, no increase in work of breathing or accessory muscle use   6. Cardiovascular : Heart rate normal, rhythm is regular, no murmurs, rubs or gallops, no peripheral edema, peripheral pulses palpated   7. Gastrointestinal:  Abdomen is soft, nondistended, nontender to palpation bowel sounds active, no masses or organomegaly palpated   8. Skin:  Erythema of the left lower extremity with edema, nontender to palpation, otherwise no acute lesions or rashes  9.Musculoskeletal:  No acute deformities or trauma, no asymmetry in tone, no peripheral edema, peripheral pulses palpated, no tenderness to palpation in the extremities    Data Review:    CBC Recent Labs  Lab 07/09/21 2138  WBC 7.8  HGB 11.8*  HCT 36.7*  PLT 190  MCV 83.6  MCH 26.9  MCHC 32.2  RDW 15.0  LYMPHSABS 1.2  MONOABS 0.8  EOSABS 0.1  BASOSABS 0.1   ------------------------------------------------------------------------------------------------------------------  Results for orders placed or performed during the hospital encounter of 07/09/21 (from the past 48 hour(s))  CBC with Differential     Status: Abnormal   Collection Time: 07/09/21  9:38 PM  Result Value Ref Range   WBC 7.8 4.0 - 10.5 K/uL   RBC 4.39 4.22 - 5.81 MIL/uL   Hemoglobin 11.8 (L) 13.0 - 17.0 g/dL   HCT 36.7 (L) 39.0 - 52.0 %   MCV 83.6 80.0 - 100.0 fL   MCH 26.9 26.0 - 34.0 pg   MCHC 32.2 30.0 - 36.0 g/dL   RDW 15.0 11.5 - 15.5 %   Platelets 190 150 - 400 K/uL   nRBC 0.0 0.0 - 0.2 %   Neutrophils Relative % 72 %   Neutro Abs 5.6 1.7 -  7.7 K/uL   Lymphocytes Relative 15 %   Lymphs Abs 1.2 0.7 - 4.0 K/uL   Monocytes Relative 10 %   Monocytes Absolute 0.8 0.1 - 1.0 K/uL   Eosinophils Relative 1 %   Eosinophils Absolute 0.1 0.0 - 0.5 K/uL   Basophils Relative 1 %   Basophils Absolute 0.1 0.0 - 0.1 K/uL   Immature Granulocytes 1 %   Abs Immature Granulocytes 0.05 0.00 - 0.07 K/uL    Comment: Performed at [facility], [address] [place], [place] [address]  Comprehensive metabolic panel     Status: Abnormal  Collection Time: 07/09/21  9:38 PM  Result Value Ref Range   Sodium 125 (L) 135 - 145 mmol/L   Potassium 3.7 3.5 - 5.1 mmol/L   Chloride 90 (L) 98 - 111 mmol/L   CO2 22 22 - 32 mmol/L   Glucose, Bld 821 (HH) 70 - 99 mg/dL    Comment: Glucose reference range applies only to samples taken after fasting for at least 8 hours. CRITICAL RESULT CALLED TO, READ BACK BY AND VERIFIED WITH: WALKER,T ON 07/09/21 AT 2235 BY LOY,C    BUN 20 6 - 20 mg/dL   Creatinine, Ser 1.72 (H) 0.61 - 1.24 mg/dL   Calcium 7.9 (L) 8.9 - 10.3 mg/dL   Total Protein 6.5 6.5 - 8.1 g/dL   Albumin 2.7 (L) 3.5 - 5.0 g/dL   AST 30 15 - 41 U/L   ALT 27 0 - 44 U/L   Alkaline Phosphatase 99 38 - 126 U/L   Total Bilirubin 0.7 0.3 - 1.2 mg/dL   GFR, Estimated 48 (L) >60 mL/min    Comment: (NOTE) Calculated using the CKD-EPI Creatinine Equation (2021)    Anion gap 13 5 - 15    Comment: Performed at [facility], [address], [place], [place] [address]  Lipase, blood     Status: None   Collection Time: 07/09/21  9:38 PM  Result Value Ref Range   Lipase 28 11 - 51 U/L    Comment: Performed at [facility], [address], [place], [place] [address]  Lactic acid, plasma     Status: Abnormal   Collection Time: 07/09/21  9:38 PM  Result Value Ref Range   Lactic Acid, Venous 6.7 (HH) 0.5 - 1.9 mmol/L    Comment: CRITICAL RESULT CALLED TO, READ BACK BY AND VERIFIED WITH: BROWN,B AT 2219 ON 7.21.22 BY RUCINSKI,B Performed at [facility],  [address] [place], [place] [address]   Troponin I (High Sensitivity)     Status: Abnormal   Collection Time: 07/09/21  9:38 PM  Result Value Ref Range   Troponin I (High Sensitivity) 18 (H) <18 ng/L    Comment: (NOTE) Elevated high sensitivity troponin I (hsTnI) values and significant  changes across serial measurements may suggest ACS but many other  chronic and acute conditions are known to elevate hsTnI results.  Refer to the Links section for chest pain algorithms and additional  guidance. Performed at [facility], [address] [place], [place] [address]   Culture, blood (routine x 2)     Status: None (Preliminary result)   Collection Time: 07/09/21  9:38 PM   Specimen: BLOOD RIGHT ARM  Result Value Ref Range   Specimen Description BLOOD RIGHT ARM    Special Requests      BOTTLES DRAWN AEROBIC AND ANAEROBIC Blood Culture results may not be optimal due to an excessive volume of blood received in culture bottles   Culture      NO GROWTH < 12 HOURS Performed at [facility], [address] [place], [place] [address]    Report Status PENDING   Culture, blood (routine x 2)     Status: None (Preliminary result)   Collection Time: 07/09/21  9:43 PM   Specimen: BLOOD LEFT ARM  Result Value Ref Range   Specimen Description BLOOD LEFT ARM    Special Requests      BOTTLES DRAWN AEROBIC AND ANAEROBIC Blood Culture adequate volume   Culture      NO GROWTH < 12 HOURS  Performed at [facility], [address], [place], [place] [address]    Report Status PENDING   Urinalysis, Routine w reflex microscopic Urine, Clean Catch     Status: Abnormal   Collection Time: 07/09/21 10:30 PM  Result Value Ref Range   Color, Urine STRAW (A) YELLOW   APPearance CLEAR CLEAR   Specific Gravity, Urine 1.017 1.005 - 1.030   pH 5.0 5.0 - 8.0   Glucose, UA >=500 (A) NEGATIVE mg/dL   Hgb urine dipstick MODERATE (A) NEGATIVE   Bilirubin Urine NEGATIVE NEGATIVE   Ketones, ur NEGATIVE NEGATIVE mg/dL    Protein, ur 30 (A) NEGATIVE mg/dL   Nitrite NEGATIVE NEGATIVE   Leukocytes,Ua NEGATIVE NEGATIVE   RBC / HPF 0-5 0 - 5 RBC/hpf   Bacteria, UA NONE SEEN NONE SEEN   Squamous Epithelial / LPF 0-5 0 - 5   Mucus PRESENT     Comment: Performed at [facility], [address], [place], [place] [address]  Lactic acid, plasma     Status: Abnormal   Collection Time: 07/09/21 11:13 PM  Result Value Ref Range   Lactic Acid, Venous 5.0 (HH) 0.5 - 1.9 mmol/L    Comment: CRITICAL RESULT CALLED TO, READ BACK BY AND VERIFIED WITH: WALKER,T@0011  BY MATTHEWS, B 7.22.22 Performed at [facility], [address] [place], [place] [address]   Troponin I (High Sensitivity)     Status: None   Collection Time: 07/09/21 11:13 PM  Result Value Ref Range   Troponin I (High Sensitivity) 17 <18 ng/L    Comment: (NOTE) Elevated high sensitivity troponin I (hsTnI) values and significant  changes across serial measurements may suggest ACS but many other  chronic and acute conditions are known to elevate hsTnI results.  Refer to the "Links" section for chest pain algorithms and additional  guidance. Performed at [facility], [address], [place], [place] [address]   CBG monitoring, ED     Status: Abnormal   Collection Time: 07/10/21  1:00 AM  Result Value Ref Range   Glucose-Capillary >600 (HH) 70 - 99 mg/dL    Comment: Glucose reference range applies only to samples taken after fasting for at least 8 hours.  CBG monitoring, ED     Status: Abnormal   Collection Time: 07/10/21  1:34 AM  Result Value Ref Range   Glucose-Capillary >600 (HH) 70 - 99 mg/dL    Comment: Glucose reference range applies only to samples taken after fasting for at least 8 hours.  CBG monitoring, ED     Status: Abnormal   Collection Time: 07/10/21  2:12 AM  Result Value Ref Range   Glucose-Capillary 559 (HH) 70 - 99 mg/dL    Comment: Glucose reference range applies only to samples taken after fasting for at least 8 hours.   Comment 1  Notify RN   Resp Panel by RT-PCR (Flu A&B, Covid) Nasopharyngeal Swab     Status: None   Collection Time: 07/10/21  2:33 AM   Specimen: Nasopharyngeal Swab; Nasopharyngeal(NP) swabs in vial transport medium  Result Value Ref Range   SARS Coronavirus 2 by RT PCR NEGATIVE NEGATIVE    Comment: (NOTE) SARS-CoV-2 target nucleic acids are NOT DETECTED.  The SARS-CoV-2 RNA is generally detectable in upper respiratory specimens during the acute phase of infection. The lowest concentration of SARS-CoV-2 viral copies this assay can detect is 138 copies/mL. A negative result does not preclude SARS-Cov-2 infection and should not be used as the sole basis for treatment  or other patient management decisions. A negative result may occur with  improper specimen collection/handling, submission of specimen other than nasopharyngeal swab, presence of viral mutation(s) within the areas targeted by this assay, and inadequate number of viral copies(<138 copies/mL). A negative result must be combined with clinical observations, patient history, and epidemiological information. The expected result is Negative.  Fact Sheet for Patients:  EntrepreneurPulse.com.au  Fact Sheet for Healthcare Providers:  IncredibleEmployment.be  This test is no t yet approved or cleared by the [place] FDA and  has been authorized for detection and/or diagnosis of SARS-CoV-2 by FDA under an Emergency Use Authorization (EUA). This EUA will remain  in effect (meaning this test can be used) for the duration of the COVID-19 declaration under Section 564(b)(1) of the Act, 21 U.S.C.section 360bbb-3(b)(1), unless the authorization is terminated  or revoked sooner.       Influenza A by PCR NEGATIVE NEGATIVE   Influenza B by PCR NEGATIVE NEGATIVE    Comment: (NOTE) The Xpert Xpress SARS-CoV-2/FLU/RSV plus assay is intended as an aid in the diagnosis of influenza from Nasopharyngeal swab  specimens and should not be used as a sole basis for treatment. Nasal washings and aspirates are unacceptable for Xpert Xpress SARS-CoV-2/FLU/RSV testing.  Fact Sheet for Patients: EntrepreneurPulse.com.au  Fact Sheet for Healthcare Providers: IncredibleEmployment.be  This test is not yet approved or cleared by the [place] FDA and has been authorized for detection and/or diagnosis of SARS-CoV-2 by FDA under an Emergency Use Authorization (EUA). This EUA will remain in effect (meaning this test can be used) for the duration of the COVID-19 declaration under Section 564(b)(1) of the Act, 21 U.S.C. section 360bbb-3(b)(1), unless the authorization is terminated or revoked.  Performed at [facility], [address] [place], [place] [address]   CBG monitoring, ED     Status: Abnormal   Collection Time: 07/10/21  2:57 AM  Result Value Ref Range   Glucose-Capillary 466 (H) 70 - 99 mg/dL    Comment: Glucose reference range applies only to samples taken after fasting for at least 8 hours.  CBG monitoring, ED     Status: Abnormal   Collection Time: 07/10/21  3:32 AM  Result Value Ref Range   Glucose-Capillary 373 (H) 70 - 99 mg/dL    Comment: Glucose reference range applies only to samples taken after fasting for at least 8 hours.  CBG monitoring, ED     Status: Abnormal   Collection Time: 07/10/21  4:26 AM  Result Value Ref Range   Glucose-Capillary 350 (H) 70 - 99 mg/dL    Comment: Glucose reference range applies only to samples taken after fasting for at least 8 hours.    Chemistries  Recent Labs  Lab 07/09/21 2138  NA 125*  K 3.7  CL 90*  CO2 22  GLUCOSE 821*  BUN 20  CREATININE 1.72*  CALCIUM 7.9*  AST 30  ALT 27  ALKPHOS 99  BILITOT 0.7    ------------------------------------------------------------------------------------------------------------------  ------------------------------------------------------------------------------------------------------------------ GFR: Estimated Creatinine Clearance: 86.9 mL/min (A) (by C-G formula based on SCr of 1.72 mg/dL (H)). Liver Function Tests: Recent Labs  Lab 07/09/21 2138  AST 30  ALT 27  ALKPHOS 99  BILITOT 0.7  PROT 6.5  ALBUMIN 2.7*   Recent Labs  Lab 07/09/21 2138  LIPASE 28   No results for input(s): AMMONIA in the last 168 hours. Coagulation Profile: No results for input(s): INR, PROTIME in the last 168 hours. Cardiac Enzymes: No results for input(s):  CKTOTAL, CKMB, CKMBINDEX, TROPONINI in the last 168 hours. BNP (last 3 results) No results for input(s): PROBNP in the last 8760 hours. HbA1C: No results for input(s): HGBA1C in the last 72 hours. CBG: Recent Labs  Lab 07/10/21 0134 07/10/21 0212 07/10/21 0257 07/10/21 0332 07/10/21 0426  GLUCAP >600* 559* 466* 373* 350*   Lipid Profile: No results for input(s): CHOL, HDL, LDLCALC, TRIG, CHOLHDL, LDLDIRECT in the last 72 hours. Thyroid Function Tests: No results for input(s): TSH, T4TOTAL, FREET4, T3FREE, THYROIDAB in the last 72 hours. Anemia Panel: No results for input(s): VITAMINB12, FOLATE, FERRITIN, TIBC, IRON, RETICCTPCT in the last 72 hours.  --------------------------------------------------------------------------------------------------------------- Urine analysis:    Component Value Date/Time   COLORURINE STRAW (A) 07/09/2021 2230   APPEARANCEUR CLEAR 07/09/2021 2230   LABSPEC 1.017 07/09/2021 2230   PHURINE 5.0 07/09/2021 2230   GLUCOSEU >=500 (A) 07/09/2021 2230   HGBUR MODERATE (A) 07/09/2021 2230   BILIRUBINUR NEGATIVE 07/09/2021 2230   KETONESUR NEGATIVE 07/09/2021 2230   PROTEINUR 30 (A) 07/09/2021 2230   UROBILINOGEN 0.2 10/27/2007 1550   NITRITE NEGATIVE 07/09/2021 2230    LEUKOCYTESUR NEGATIVE 07/09/2021 2230      Imaging Results:    DG Chest 2 View  Result Date: 07/09/2021 CLINICAL DATA:  Chest pain EXAM: CHEST - 2 VIEW COMPARISON:  04/11/2021 FINDINGS: Lungs are clear. No pneumothorax or pleural effusion. Coronary artery bypass grafting has been performed. Cardiac size within normal limits. No acute bone abnormality. IMPRESSION: No active cardiopulmonary disease. Electronically Signed   By: Fidela Salisbury MD   On: 07/09/2021 22:24   DG Foot Complete Left  Result Date: 07/09/2021 CLINICAL DATA:  Ulcer at the third metatarsal area for a few days. Left foot swelling. Question osteomyelitis. EXAM: LEFT FOOT - COMPLETE 3+ VIEW COMPARISON:  Radiograph 02/18/2021 FINDINGS: Prior transmetatarsal amputation of the fourth ray. Soft tissue defect in the plantar soft tissues at the level of the third metatarsal phalangeal joint. No subjacent bony destruction, erosion, or abnormal bone density to suggest osteomyelitis. No radiopaque foreign body. Slight hammertoe deformity of the second and third toes. No fracture. Plantar calcaneal spur and minimal hindfoot degenerative change. IMPRESSION: 1. Soft tissue defect in the plantar soft tissues at the level of the third metatarsophalangeal joint consistent with ulcer. No radiographic evidence of osteomyelitis. No radiopaque foreign body. 2. Prior transmetatarsal amputation of the fourth ray. Electronically Signed   By: Keith Rake M.D.   On: 07/09/2021 23:49       Assessment & Plan:    Active Problems:   Insulin dependent type 2 diabetes mellitus (HCC)   Severe sepsis --Severe Sepsis secondary to Lt leg/Foot Cellulitis with hypotension and AKI   Left leg cellulitis   Acute hyperglycemia   Sepsis secondary to cellulitis Lactic acid of 6.7, heart rate 950, systolic blood pressure reportedly dipping into the 80s Blood cultures pending Vancomycin and Zosyn initially started, changed to vancomycin and cefepime Sources  left lower extremity cellulitis most likely Lactic acid trending down after fluid bolus Patient has AKI with a creatinine of 1.72 baseline 1.4 Patient did fail outpatient treatment with cefdinir Hyperglycemia in the setting of uncontrolled diabetes mellitus type 2 Glucose as high as 821, without alarming bicarb or gap Endo tool started Last hemoglobin A1c was within 90 days and was 9.9 N.p.o. Monitor in ICU Protein calorie malnutrition Consider adding Glucerna shakes after patient is able to come off of insulin drip and tolerate p.o. Albumin 2.7 Coronary artery disease Continue Plavix, aspirin,  statin, nitrate    DVT Prophylaxis-   Heparin- SCDs   AM Labs Ordered, also please review Full Orders  Family Communication: No family at bedside Code Status: Full  Admission status: Inpatient :The appropriate admission status for this patient is INPATIENT. Inpatient status is judged to be reasonable and necessary in order to provide the required intensity of service to ensure the patient's safety. The patient's presenting symptoms, physical exam findings, and initial radiographic and laboratory data in the context of their chronic comorbidities is felt to place them at high risk for further clinical deterioration. Furthermore, it is not anticipated that the patient will be medically stable for discharge from the hospital within 2 midnights of admission. The following factors support the admission status of inpatient.     The patient's presenting symptoms include body pains. The worrisome physical exam findings include left lower extremity cellulitis. The initial radiographic and laboratory data are worrisome because of lactic acidosis 6.7. The chronic co-morbidities include coronary artery disease and uncontrolled diabetes mellitus type 2.       * I certify that at the point of admission it is my clinical judgment that the patient will require inpatient hospital care spanning beyond 2  midnights from the point of admission due to high intensity of service, high risk for further deterioration and high frequency of surveillance required.*  Time spent in minutes : [place]

## 2021-07-10 NOTE — Progress Notes (Signed)
Pharmacy Antibiotic Note  Joseph [name] is a 50 y.o. male admitted on 07/09/2021 with cellulitis.  Pharmacy has been consulted for vancomycin dosing. Renal function improving. Will adjust vancomycin  Plan: Vancomycin 2000mg  IV Q12H. Goal AUC 400-550.  Expected AUC 410.  F/u cxs and clinical progress Monitor V/S, labs, and levels as indicated  Height: 6\' 5"  (195.6 cm) Weight: (!) 163.5 kg (360 lb 7.2 oz) IBW/kg (Calculated) : 89.1  Temp (24hrs), Avg:98.6 F (37 C), Min:98 F (36.7 C), Max:99.1 F (37.3 C)  Recent Labs  Lab 07/09/21 2138 07/09/21 2313 07/10/21 0325 07/10/21 0410  WBC 7.8  --   --   --   CREATININE 1.72*  --   --  1.44*  LATICACIDVEN 6.7* 5.0* 3.7*  --      Estimated Creatinine Clearance: 105.5 mL/min (A) (by C-G formula based on SCr of 1.44 mg/dL (H)).    Allergies  Allergen Reactions   Sulfa Antibiotics Other (See Comments)    Headaches    Antibiotics: Vancomycin 7/21>> Zosyn x 1 dose 7/21  Cultures: 7/21 BCX: pending  Thank you for allowing pharmacy to be a part of this patient's care.  8/21, BS 8/21, Elder Cyphers Clinical Pharmacist Pager [phone] 07/10/2021 12:44 PM

## 2021-07-10 NOTE — ED Notes (Signed)
Message sent to admitting provider for pain medication and alprazolam for sleep per patient's request.

## 2021-07-10 NOTE — Progress Notes (Signed)
Patient's O2 sats have been dropping while he sleeps into the 50s-60s. Discussed using a CPAP tonight but patient did not want to at this time. Placed him on a 4 lpm salter HFNC and told him if he continued to drop and that wasn't enough to maintain his O2 levels then we would try a CPAP machine. Patient agreeable at this time. RN made aware.

## 2021-07-10 NOTE — Sepsis Progress Note (Signed)
IVF bolus that was ordered & given is less than calculation according to documented Epic weight of 158.8 kg.  Unable to verify weight used for ordered bolus with ED RN or admitting physician.

## 2021-07-10 NOTE — Progress Notes (Signed)
Patient seen and examined. Admitted after midnight secondary to HONK and RLE cellulitis; patient met Sepsis criteria on admission with elevated HR and fever, source of infection cellulitis. Currently hemodynamically stable and with overall improvement in his clinical status. Please refer to H&P written by Dr. Clearence Ped for further info/details on admission.  Plan: -continue current antibiotics -follow culture results -continue to maintain adequate hydration and IVF's -will follow CBG's and transition him off insulin drip. -follow lactic acid level.  Barton Dubois MD [phone]

## 2021-07-11 DIAGNOSIS — L03116 Cellulitis of left lower limb: Secondary | ICD-10-CM | POA: Diagnosis not present

## 2021-07-11 DIAGNOSIS — E1165 Type 2 diabetes mellitus with hyperglycemia: Secondary | ICD-10-CM

## 2021-07-11 DIAGNOSIS — E119 Type 2 diabetes mellitus without complications: Secondary | ICD-10-CM | POA: Diagnosis not present

## 2021-07-11 DIAGNOSIS — R739 Hyperglycemia, unspecified: Secondary | ICD-10-CM | POA: Diagnosis not present

## 2021-07-11 LAB — GLUCOSE, CAPILLARY
Glucose-Capillary: 350 mg/dL — ABNORMAL HIGH (ref 70–99)
Glucose-Capillary: 333 mg/dL — ABNORMAL HIGH (ref 70–99)
Glucose-Capillary: 373 mg/dL — ABNORMAL HIGH (ref 70–99)
Glucose-Capillary: 342 mg/dL — ABNORMAL HIGH (ref 70–99)
Glucose-Capillary: 291 mg/dL — ABNORMAL HIGH (ref 70–99)

## 2021-07-11 LAB — BASIC METABOLIC PANEL
Anion gap: 7 (ref 5–15)
BUN: 18 mg/dL (ref 6–20)
CO2: 27 mmol/L (ref 22–32)
Calcium: 8 mg/dL — ABNORMAL LOW (ref 8.9–10.3)
Chloride: 100 mmol/L (ref 98–111)
Creatinine, Ser: 1.31 mg/dL — ABNORMAL HIGH (ref 0.61–1.24)
GFR, Estimated: >60 mL/min (ref >60)
Glucose, Bld: 347 mg/dL — ABNORMAL HIGH (ref 70–99)
Potassium: 3.8 mmol/L (ref 3.5–5.1)
Sodium: 134 mmol/L — ABNORMAL LOW (ref 135–145)

## 2021-07-11 LAB — BLOOD GAS, ARTERIAL
Acid-Base Excess: 1.2 mmol/L (ref 0.0–2.0)
Bicarbonate: 24.9 mmol/L (ref 20.0–28.0)
FIO2: 36
O2 Saturation: 97.8 %
Patient temperature: 36.7
pCO2 arterial: 49.8 mmHg — ABNORMAL HIGH (ref 32.0–48.0)
pH, Arterial: 7.342 — ABNORMAL LOW (ref 7.350–7.450)
pO2, Arterial: 101 mmHg (ref 83.0–108.0)

## 2021-07-11 LAB — MRSA NEXT GEN BY PCR, NASAL: MRSA by PCR Next Gen: NOT DETECTED

## 2021-07-11 MED ORDER — OXYCODONE HCL 5 MG PO TABS
5.0000 mg | ORAL_TABLET | Freq: Four times a day (QID) | ORAL | Status: DC | PRN
  Administered 2021-07-11 – 2021-07-12 (×3): 5 mg via ORAL
  Filled 2021-07-11 (×3): qty 1

## 2021-07-11 MED ORDER — MORPHINE SULFATE (PF) 2 MG/ML IV SOLN
2.0000 mg | INTRAVENOUS | Status: DC | PRN
  Administered 2021-07-11: 2 mg via INTRAVENOUS
  Filled 2021-07-11: qty 1

## 2021-07-11 MED ORDER — NALOXONE HCL 0.4 MG/ML IJ SOLN
INTRAMUSCULAR | Status: AC
  Administered 2021-07-11: 0.2 mg via INTRAVENOUS
  Filled 2021-07-11: qty 1

## 2021-07-11 MED ORDER — NALOXONE HCL 0.4 MG/ML IJ SOLN
0.2000 mg | INTRAMUSCULAR | Status: DC | PRN
  Administered 2021-07-11 (×3): 0.2 mg via INTRAVENOUS
  Filled 2021-07-11 (×2): qty 1

## 2021-07-11 MED ORDER — ALPRAZOLAM 1 MG PO TABS
1.0000 mg | ORAL_TABLET | Freq: Two times a day (BID) | ORAL | Status: DC | PRN
  Administered 2021-07-11 – 2021-07-12 (×2): 1 mg via ORAL
  Filled 2021-07-11 (×2): qty 2

## 2021-07-11 MED ORDER — OXYCODONE HCL 5 MG PO TABS: 5.0000 mg | ORAL_TABLET | Freq: Four times a day (QID) | ORAL | Status: DC | PRN

## 2021-07-11 NOTE — Progress Notes (Signed)
PROGRESS NOTE    Joseph [name]  TKZ:[id number] DOB: 1971-04-21 DOA: 07/09/2021 PCP: Marva Panda, NP (Confirm with patient/family/NH records and if not entered, this HAS to be entered at [facility] point of entry. "No PCP" if truly none.)   Chief Complaint  Patient presents with   Abdominal Pain    LUQ pain    Brief admission Narrative:  As per H&P written by Dr.Zierle-Ghosh on 07/10/21   Joseph [name]  is a 50 y.o. male,with history of anxiety, asthma, CAD, chronic bronchitis, COPD, HLD, HTN, MI, T2DM, and more presents to the ED with a chief complaint of body pains. He reports that he has right shoulder pain, left rib pain, and erythema of the left lower extremity.  Patient reports that out of these 3 complaints he is most concerned with the left rib pain.  He reports that it is relieved by nitro, but he does not like to take nitro because it gives him a headache.  He reports that the pain started 2.5-3 days ago.  He reports he cannot remember the details 100% as he has had memory issues since his CABG almost a year ago.  He has not had any falls on his ribs, no heavy lifting.  He reports that the pain radiates all the way across to his chest past midline and over to the right.  He describes the pain as a squeezing and sharp pain like somebody is grabbing him who has sharp fingernails.  The pain is constant, worse with deep inspiration.  Laying on his left side helps relieve the pain.  When discussing his leg he is less concerned.  He reports that he has had cellulitis of the left lower extremity for almost 2 years.  He reports he has been on antibiotics constantly, but most recently was cefdinir.  He is currently 5 to 6 days into the cefdinir course.  He denies any fevers.  He does have nausea and vomiting without hematemesis.  Patient reports that there is no drainage from his left lower extremity but that it has been more erythematous than normal.   Patient reports he has not been following  his sugars at home because he did not have a meter.  He does take Lantus 60 units twice daily and a sliding scale of Humalog.   Patient does not smoke, drinks occasionally, does not use illicit drugs.  He is not vaccinated for COVID.  Patient is full code.  Assessment & Plan: 1-HONK -in the setting of uncontrolled insulin dependent type 2 diabetes mellitus (HCC) -Currently successfully off insulin drip -Continue sliding scale insulin and long-acting -Continue maintaining adequate hydration -Follow clinical response. -Close monitoring of patient's CBGs with further adjustment to hypoglycemia regimen as required.  2-Severe sepsis --Severe Sepsis secondary to Lt leg/Foot Cellulitis with hypotension and AKI -Continue current antibiotics -No osteomyelitis appreciated on x-ray -Continue judicious use of analgesic therapy -Will touch base with Dr. [name] and follow recommendations by wound care.  3-history of coronary artery disease -No chest pain -Continue Plavix, aspirin, and statins  4-gastroesophageal disease -Continue PPI.  5-obstructive sleep apnea/obesity -Body mass index is 42.74 kg/m. -Low calorie diet, portion control and increase physical activity discussed with patient -Continue nightly CPAP  6-depression/anxiety -Continue Lexapro and appropriate -Judicious use of as needed benzodiazepine -Currently increased somnolence appreciated.  7-lactic acidosis -In the setting of severe sepsis and HONK/dehydration -Continue IV fluid -Follow lactic acid level.  DVT prophylaxis: Heparin Code Status: Full code Family Communication: No family at  bedside. Disposition:   Status is: Inpatient  Remains inpatient appropriate because:IV treatments appropriate due to intensity of illness or inability to take PO  Dispo: The patient is from: Home              Anticipated d/c is to: Home              Patient currently is not medically stable to d/c.   Difficult to place patient  No       Consultants:  None  Procedures:  See below for x-ray reports.  Antimicrobials:  Vancomycin   Subjective: No fever, no chest pain, no nausea or vomiting.  Patient with increased somnolence and mild hypoxia.  Objective: Vitals:   07/11/21 0900 07/11/21 1000 07/11/21 1027 07/11/21 1030  BP: (!) 142/71 121/84    Pulse: 81 81 78 75  Resp: 15 14 14 13   Temp: (!) 97.3 F (36.3 C)     TempSrc: Axillary     SpO2: 100% 100% (!) 61% 98%  Weight:      Height:        Intake/Output Summary (Last 24 hours) at 07/11/2021 1039 Last data filed at 07/11/2021 0924 Gross per 24 hour  Intake 3975.31 ml  Output 420 ml  Net 3555.31 ml   Filed Weights   07/09/21 2050 07/10/21 0546  Weight: (!) 158.8 kg (!) 163.5 kg    Examination:  General exam: Afebrile, increased somnolence appreciated. Respiratory system: No wheezing, no crackles, no using accessory muscle. Cardiovascular system: S1 & S2 heard, RRR. No JVD, murmurs, rubs, gallops or clicks. No pedal edema. Gastrointestinal system: Abdomen is nondistended, soft and nontender. No organomegaly or masses felt. Normal bowel sounds heard. Central nervous system: Alert and oriented. No focal neurological deficits. Extremities/skin: Multiples amputations of his toes bilaterally appreciated.  Open wound in his left plantar surface with active serosanguineous drainage.  Erythematous changes and swelling on left lower extremity seen. Psychiatry: Judgement and insight appear normal. Mood & affect appropriate.     Data Reviewed: I have personally reviewed following labs and imaging studies  CBC: Recent Labs  Lab 07/09/21 2138  WBC 7.8  NEUTROABS 5.6  HGB 11.8*  HCT 36.7*  MCV 83.6  PLT 190    Basic Metabolic Panel: Recent Labs  Lab 07/09/21 2138 07/10/21 0410 07/11/21 0421  NA 125* 134* 134*  K 3.7 3.4* 3.8  CL 90* 99 100  CO2 22 25 27   GLUCOSE 821* 328* 347*  BUN 20 18 18   CREATININE 1.72* 1.44* 1.31*  CALCIUM  7.9* 8.4* 8.0*  MG  --  1.5*  --     GFR: Estimated Creatinine Clearance: 116 mL/min (A) (by C-G formula based on SCr of 1.31 mg/dL (H)).  Liver Function Tests: Recent Labs  Lab 07/09/21 2138  AST 30  ALT 27  ALKPHOS 99  BILITOT 0.7  PROT 6.5  ALBUMIN 2.7*    CBG: Recent Labs  Lab 07/10/21 1454 07/10/21 1628 07/10/21 2017 07/11/21 0207 07/11/21 0736  GLUCAP 158* 106* 229* 350* 333*     Recent Results (from the past 240 hour(s))  Culture, blood (routine x 2)     Status: None (Preliminary result)   Collection Time: 07/09/21  9:38 PM   Specimen: BLOOD RIGHT ARM  Result Value Ref Range Status   Specimen Description BLOOD RIGHT ARM  Final   Special Requests   Final    BOTTLES DRAWN AEROBIC AND ANAEROBIC Blood Culture results may not be optimal due to  an excessive volume of blood received in culture bottles   Culture   Final    NO GROWTH 2 DAYS Performed at [facility], [address] [place], [name] [address]    Report Status PENDING  Incomplete  Culture, blood (routine x 2)     Status: None (Preliminary result)   Collection Time: 07/09/21  9:43 PM   Specimen: BLOOD LEFT ARM  Result Value Ref Range Status   Specimen Description BLOOD LEFT ARM  Final   Special Requests   Final    BOTTLES DRAWN AEROBIC AND ANAEROBIC Blood Culture adequate volume   Culture   Final    NO GROWTH 2 DAYS Performed at [facility], [address] [place], [name] [address]    Report Status PENDING  Incomplete  Resp Panel by RT-PCR (Flu A&B, Covid) Nasopharyngeal Swab     Status: None   Collection Time: 07/10/21  2:33 AM   Specimen: Nasopharyngeal Swab; Nasopharyngeal(NP) swabs in vial transport medium  Result Value Ref Range Status   SARS Coronavirus 2 by RT PCR NEGATIVE NEGATIVE Final    Comment: (NOTE) SARS-CoV-2 target nucleic acids are NOT DETECTED.  The SARS-CoV-2 RNA is generally detectable in upper respiratory specimens during the acute phase of infection. The  lowest concentration of SARS-CoV-2 viral copies this assay can detect is 138 copies/mL. A negative result does not preclude SARS-Cov-2 infection and should not be used as the sole basis for treatment or other patient management decisions. A negative result may occur with  improper specimen collection/handling, submission of specimen other than nasopharyngeal swab, presence of viral mutation(s) within the areas targeted by this assay, and inadequate number of viral copies(<138 copies/mL). A negative result must be combined with clinical observations, patient history, and epidemiological information. The expected result is Negative.  Fact Sheet for Patients:  BloggerCourse.com  Fact Sheet for Healthcare Providers:  SeriousBroker.it  This test is no t yet approved or cleared by the [place] FDA and  has been authorized for detection and/or diagnosis of SARS-CoV-2 by FDA under an Emergency Use Authorization (EUA). This EUA will remain  in effect (meaning this test can be used) for the duration of the COVID-19 declaration under Section 564(b)(1) of the Act, 21 U.S.C.section 360bbb-3(b)(1), unless the authorization is terminated  or revoked sooner.       Influenza A by PCR NEGATIVE NEGATIVE Final   Influenza B by PCR NEGATIVE NEGATIVE Final    Comment: (NOTE) The Xpert Xpress SARS-CoV-2/FLU/RSV plus assay is intended as an aid in the diagnosis of influenza from Nasopharyngeal swab specimens and should not be used as a sole basis for treatment. Nasal washings and aspirates are unacceptable for Xpert Xpress SARS-CoV-2/FLU/RSV testing.  Fact Sheet for Patients: BloggerCourse.com  Fact Sheet for Healthcare Providers: SeriousBroker.it  This test is not yet approved or cleared by the [place] FDA and has been authorized for detection and/or diagnosis of SARS-CoV-2 by FDA under  an Emergency Use Authorization (EUA). This EUA will remain in effect (meaning this test can be used) for the duration of the COVID-19 declaration under Section 564(b)(1) of the Act, 21 U.S.C. section 360bbb-3(b)(1), unless the authorization is terminated or revoked.  Performed at [facility], [address], [place], [name] [address]      Radiology Studies: DG Chest 2 View  Result Date: 07/09/2021 CLINICAL DATA:  Chest pain EXAM: CHEST - 2 VIEW COMPARISON:  04/11/2021 FINDINGS: Lungs are clear. No pneumothorax or pleural effusion. Coronary artery bypass grafting  has been performed. Cardiac size within normal limits. No acute bone abnormality. IMPRESSION: No active cardiopulmonary disease. Electronically Signed   By: Helyn NumbersAshesh  Parikh MD   On: 07/09/2021 22:24   DG Foot Complete Left  Result Date: 07/09/2021 CLINICAL DATA:  Ulcer at the third metatarsal area for a few days. Left foot swelling. Question osteomyelitis. EXAM: LEFT FOOT - COMPLETE 3+ VIEW COMPARISON:  Radiograph 02/18/2021 FINDINGS: Prior transmetatarsal amputation of the fourth ray. Soft tissue defect in the plantar soft tissues at the level of the third metatarsal phalangeal joint. No subjacent bony destruction, erosion, or abnormal bone density to suggest osteomyelitis. No radiopaque foreign body. Slight hammertoe deformity of the second and third toes. No fracture. Plantar calcaneal spur and minimal hindfoot degenerative change. IMPRESSION: 1. Soft tissue defect in the plantar soft tissues at the level of the third metatarsophalangeal joint consistent with ulcer. No radiographic evidence of osteomyelitis. No radiopaque foreign body. 2. Prior transmetatarsal amputation of the fourth ray. Electronically Signed   By: Narda RutherfordMelanie  Sanford M.D.   On: 07/09/2021 23:49     Scheduled Meds:  ALPRAZolam  1 mg Oral BID   ALPRAZolam  2 mg Oral QHS   aspirin EC  81 mg Oral Daily   atorvastatin  80 mg Oral q1800   buPROPion  150 mg Oral BID    Chlorhexidine Gluconate Cloth  6 each Topical Q0600   clopidogrel  75 mg Oral Q breakfast   escitalopram  10 mg Oral q AM   fenofibrate  54 mg Oral Daily   heparin  5,000 Units Subcutaneous Q8H   insulin aspart  0-20 Units Subcutaneous TID WC   insulin aspart  0-5 Units Subcutaneous QHS   insulin aspart  6 Units Subcutaneous TID WC   insulin detemir  30 Units Subcutaneous BID   midodrine  5 mg Oral TID WC   pantoprazole  40 mg Oral Daily   pregabalin  150 mg Oral TID   Continuous Infusions:  lactated ringers 100 mL/hr at 07/11/21 0916   vancomycin Stopped (07/11/21 0414)     LOS: 2 days    Time spent: 35 minutes.   Vassie Lollarlos Kieanna Rollo, MD [facility]   To contact the attending provider between 7A-7P or the covering provider during after hours 7P-7A, please log into the web site [email or web address] and access using universal [place] password for that web site. If you do not have the password, please call the hospital operator.  07/11/2021, 10:39 AM

## 2021-07-11 NOTE — Progress Notes (Signed)
RT called to patients room by RN for desatting. Patient placed on auto CPAP with a large full face mask. Patient's sats came up briefly then dropped into the 70s again. RT switched machine over to auto bilevel mode with a pressure support of 3-5cm and IPAP/EPAP of 20-5 cm. Patient's sats immediately improved and he is maintaining on those settings with 6 lpm bled in. O2 sat currently 95%. Order placed per RT protocol and patient's diagnosis of OSA in chart and history.

## 2021-07-12 DIAGNOSIS — L03116 Cellulitis of left lower limb: Secondary | ICD-10-CM | POA: Diagnosis not present

## 2021-07-12 DIAGNOSIS — R739 Hyperglycemia, unspecified: Secondary | ICD-10-CM | POA: Diagnosis not present

## 2021-07-12 DIAGNOSIS — E119 Type 2 diabetes mellitus without complications: Secondary | ICD-10-CM | POA: Diagnosis not present

## 2021-07-12 DIAGNOSIS — E1165 Type 2 diabetes mellitus with hyperglycemia: Secondary | ICD-10-CM | POA: Diagnosis not present

## 2021-07-12 LAB — GLUCOSE, CAPILLARY
Glucose-Capillary: 317 mg/dL — ABNORMAL HIGH (ref 70–99)
Glucose-Capillary: 362 mg/dL — ABNORMAL HIGH (ref 70–99)
Glucose-Capillary: 340 mg/dL — ABNORMAL HIGH (ref 70–99)
Glucose-Capillary: 326 mg/dL — ABNORMAL HIGH (ref 70–99)

## 2021-07-12 LAB — BASIC METABOLIC PANEL
Anion gap: 8 (ref 5–15)
BUN: 17 mg/dL (ref 6–20)
CO2: 28 mmol/L (ref 22–32)
Calcium: 8.4 mg/dL — ABNORMAL LOW (ref 8.9–10.3)
Chloride: 98 mmol/L (ref 98–111)
Creatinine, Ser: 1.25 mg/dL — ABNORMAL HIGH (ref 0.61–1.24)
GFR, Estimated: >60 mL/min (ref >60)
Glucose, Bld: 338 mg/dL — ABNORMAL HIGH (ref 70–99)
Potassium: 3.9 mmol/L (ref 3.5–5.1)
Sodium: 134 mmol/L — ABNORMAL LOW (ref 135–145)

## 2021-07-12 LAB — CBC
HCT: 40.3 % (ref 39.0–52.0)
Hemoglobin: 12.6 g/dL — ABNORMAL LOW (ref 13.0–17.0)
MCH: 26.5 pg (ref 26.0–34.0)
MCHC: 31.3 g/dL (ref 30.0–36.0)
MCV: 84.7 fL (ref 80.0–100.0)
Platelets: 239 10*3/uL (ref 150–400)
RBC: 4.76 MIL/uL (ref 4.22–5.81)
RDW: 15.2 % (ref 11.5–15.5)
WBC: 7.2 10*3/uL (ref 4.0–10.5)
nRBC: 0 % (ref 0.0–0.2)

## 2021-07-12 LAB — LACTIC ACID, PLASMA: Lactic Acid, Venous: 1.5 mmol/L (ref 0.5–1.9)

## 2021-07-12 MED ORDER — OXYCODONE HCL 5 MG PO TABS
15.0000 mg | ORAL_TABLET | Freq: Four times a day (QID) | ORAL | Status: DC | PRN
  Administered 2021-07-12 – 2021-07-13 (×3): 15 mg via ORAL
  Filled 2021-07-12 (×3): qty 3

## 2021-07-12 NOTE — Progress Notes (Signed)
PROGRESS NOTE    Joseph [name]  ZOX:[id number] DOB: July 16, 1971 DOA: 07/09/2021 PCP: Marva PandaMillsaps, Kimberly, NP (Confirm with patient/family/NH records and if not entered, this HAS to be entered at [facility] point of entry. "No PCP" if truly none.)   Chief Complaint  Patient presents with   Abdominal Pain    LUQ pain    Brief admission Narrative:  As per H&P written by Dr.Zierle-Ghosh on 07/10/21   Joseph [name]  is a 50 y.o. male,with history of anxiety, asthma, CAD, chronic bronchitis, COPD, HLD, HTN, MI, T2DM, and more presents to the ED with a chief complaint of body pains. He reports that he has right shoulder pain, left rib pain, and erythema of the left lower extremity.  Patient reports that out of these 3 complaints he is most concerned with the left rib pain.  He reports that it is relieved by nitro, but he does not like to take nitro because it gives him a headache.  He reports that the pain started 2.5-3 days ago.  He reports he cannot remember the details 100% as he has had memory issues since his CABG almost a year ago.  He has not had any falls on his ribs, no heavy lifting.  He reports that the pain radiates all the way across to his chest past midline and over to the right.  He describes the pain as a squeezing and sharp pain like somebody is grabbing him who has sharp fingernails.  The pain is constant, worse with deep inspiration.  Laying on his left side helps relieve the pain.  When discussing his leg he is less concerned.  He reports that he has had cellulitis of the left lower extremity for almost 2 years.  He reports he has been on antibiotics constantly, but most recently was cefdinir.  He is currently 5 to 6 days into the cefdinir course.  He denies any fevers.  He does have nausea and vomiting without hematemesis.  Patient reports that there is no drainage from his left lower extremity but that it has been more erythematous than normal.   Patient reports he has not been following  his sugars at home because he did not have a meter.  He does take Lantus 60 units twice daily and a sliding scale of Humalog.   Patient does not smoke, drinks occasionally, does not use illicit drugs.  He is not vaccinated for COVID.  Patient is full code.  Assessment & Plan: 1-HONK -in the setting of uncontrolled insulin dependent type 2 diabetes mellitus (HCC) -Currently successfully off insulin drip -Continue sliding scale insulin and long-acting -Continue maintaining adequate hydration -Follow clinical response. -Close monitoring of patient's CBGs with further adjustment to hypoglycemia regimen as required.  2-Severe sepsis --Severe Sepsis secondary to Lt leg/Foot Cellulitis with hypotension and AKI -Continue current antibiotics for another 24 hours; if remains stable will discharge home with oral treatment and outpatient follow-up with Dr. Lajoyce Cornersuda. -No osteomyelitis appreciated on x-ray -Continue judicious use of analgesic therapy -Continue to follow recommendations by wound care service.  3-history of coronary artery disease -No chest pain -Continue Plavix, aspirin, and statins  4-gastroesophageal disease -Continue PPI.  5-obstructive sleep apnea/obesity -Body mass index is 42.74 kg/m. -Low calorie diet, portion control and increase physical activity discussed with patient -Continue nightly CPAP -Patient will need outpatient sleep study.  6-depression/anxiety -Continue Lexapro and appropriate -Continue judicious use of as needed benzodiazepine  7-lactic acidosis -In the setting of severe sepsis and HONK/dehydration -To maintain  adequate hydration orally -Patient lactic acid within normal limits at this time.  8-chronic pain syndrome -Continue judicious usage of opiates. -Patient oriented x3 and following commands appropriately.  DVT prophylaxis: Heparin Code Status: Full code Family Communication: No family at bedside. Disposition:   Status is:  Inpatient  Remains inpatient appropriate because:IV treatments appropriate due to intensity of illness or inability to take PO  Dispo: The patient is from: Home              Anticipated d/c is to: Home              Patient currently Is reaching medical stability; planning for patient to be discharged on oral antibiotics if otherwise remained stable on 07/13/2021.   Difficult to place patient No       Consultants:  None  Procedures:  See below for x-ray reports.  Antimicrobials:  Vancomycin   Subjective: No chest pain, no nausea or vomiting.  Reports severe lower back pain.  Still with erythematous changes and swelling on his left lower extremity.  Reports good urine output and is currently afebrile  Objective: Vitals:   07/12/21 1500 07/12/21 1600 07/12/21 1607 07/12/21 1614  BP:    (!) 168/92  Pulse: 70 76 74 74  Resp:      Temp:    99.8 F (37.7 C)  TempSrc:    Oral  SpO2: 91% 95% 96% 99%  Weight:      Height:        Intake/Output Summary (Last 24 hours) at 07/12/2021 1733 Last data filed at 07/12/2021 1625 Gross per 24 hour  Intake 3020.45 ml  Output 4350 ml  Net -1329.55 ml   Filed Weights   07/09/21 2050 07/10/21 0546  Weight: (!) 158.8 kg (!) 163.5 kg    Examination: General exam: Alert, awake, oriented x 3; complaining of back pain, no chest pain, no nausea, no vomiting.  Patient is following commands appropriately and expressing good urine output.  Still with left lower extremity swelling and erythematous changes. Respiratory system: Clear to auscultation. Respiratory effort normal.  No requiring oxygen supplementation.  No using accessory muscle. Cardiovascular system:RRR. No murmurs, rubs, gallops.  No JVD on exam. Gastrointestinal system: Abdomen is nondistended, soft and nontender. No organomegaly or masses felt. Normal bowel sounds heard. Central nervous system: Alert and oriented. No focal neurological deficits. Extremities/skin: No cyanosis or  clubbing; amputations of great toe on his right foot and fourth toe on his left toe appreciated; Scant serosanguineous drainage seen on left plantar surface wound and swelling/erythema appreciated on his left lower extremity.  Leg was warm on palpation Psychiatry: Judgement and insight appear normal. Mood & affect appropriate.     Data Reviewed: I have personally reviewed following labs and imaging studies  CBC: Recent Labs  Lab 07/09/21 2138 07/12/21 0541  WBC 7.8 7.2  NEUTROABS 5.6  --   HGB 11.8* 12.6*  HCT 36.7* 40.3  MCV 83.6 84.7  PLT 190 239    Basic Metabolic Panel: Recent Labs  Lab 07/09/21 2138 07/10/21 0410 07/11/21 0421 07/12/21 0541  NA 125* 134* 134* 134*  K 3.7 3.4* 3.8 3.9  CL 90* 99 100 98  CO2 22 25 27 28   GLUCOSE 821* 328* 347* 338*  BUN 20 18 18 17   CREATININE 1.72* 1.44* 1.31* 1.25*  CALCIUM 7.9* 8.4* 8.0* 8.4*  MG  --  1.5*  --   --     GFR: Estimated Creatinine Clearance: 121.5 mL/min (A) (by  C-G formula based on SCr of 1.25 mg/dL (H)).  Liver Function Tests: Recent Labs  Lab 07/09/21 2138  AST 30  ALT 27  ALKPHOS 99  BILITOT 0.7  PROT 6.5  ALBUMIN 2.7*    CBG: Recent Labs  Lab 07/11/21 1618 07/11/21 2006 07/12/21 0724 07/12/21 1104 07/12/21 1619  GLUCAP 342* 291* 317* 362* 340*     Recent Results (from the past 240 hour(s))  Culture, blood (routine x 2)     Status: None (Preliminary result)   Collection Time: 07/09/21  9:38 PM   Specimen: BLOOD RIGHT ARM  Result Value Ref Range Status   Specimen Description BLOOD RIGHT ARM  Final   Special Requests   Final    BOTTLES DRAWN AEROBIC AND ANAEROBIC Blood Culture results may not be optimal due to an excessive volume of blood received in culture bottles   Culture   Final    NO GROWTH 3 DAYS Performed at [facility], [name] [place], [name] [address]    Report Status PENDING  Incomplete  Culture, blood (routine x 2)     Status: None (Preliminary result)    Collection Time: 07/09/21  9:43 PM   Specimen: BLOOD LEFT ARM  Result Value Ref Range Status   Specimen Description BLOOD LEFT ARM  Final   Special Requests   Final    BOTTLES DRAWN AEROBIC AND ANAEROBIC Blood Culture adequate volume   Culture   Final    NO GROWTH 3 DAYS Performed at [facility], [address] [place], [name] [address]    Report Status PENDING  Incomplete  Resp Panel by RT-PCR (Flu A&B, Covid) Nasopharyngeal Swab     Status: None   Collection Time: 07/10/21  2:33 AM   Specimen: Nasopharyngeal Swab; Nasopharyngeal(NP) swabs in vial transport medium  Result Value Ref Range Status   SARS Coronavirus 2 by RT PCR NEGATIVE NEGATIVE Final    Comment: (NOTE) SARS-CoV-2 target nucleic acids are NOT DETECTED.  The SARS-CoV-2 RNA is generally detectable in upper respiratory specimens during the acute phase of infection. The lowest concentration of SARS-CoV-2 viral copies this assay can detect is 138 copies/mL. A negative result does not preclude SARS-Cov-2 infection and should not be used as the sole basis for treatment or other patient management decisions. A negative result may occur with  improper specimen collection/handling, submission of specimen other than nasopharyngeal swab, presence of viral mutation(s) within the areas targeted by this assay, and inadequate number of viral copies(<138 copies/mL). A negative result must be combined with clinical observations, patient history, and epidemiological information. The expected result is Negative.  Fact Sheet for Patients:  BloggerCourse.com  Fact Sheet for Healthcare Providers:  SeriousBroker.it  This test is no t yet approved or cleared by the [place] FDA and  has been authorized for detection and/or diagnosis of SARS-CoV-2 by FDA under an Emergency Use Authorization (EUA). This EUA will remain  in effect (meaning this test can be used) for the duration of  the COVID-19 declaration under Section 564(b)(1) of the Act, 21 U.S.C.section 360bbb-3(b)(1), unless the authorization is terminated  or revoked sooner.       Influenza A by PCR NEGATIVE NEGATIVE Final   Influenza B by PCR NEGATIVE NEGATIVE Final    Comment: (NOTE) The Xpert Xpress SARS-CoV-2/FLU/RSV plus assay is intended as an aid in the diagnosis of influenza from Nasopharyngeal swab specimens and should not be used as a sole basis for treatment. Nasal washings and aspirates are  unacceptable for Xpert Xpress SARS-CoV-2/FLU/RSV testing.  Fact Sheet for Patients: BloggerCourse.com  Fact Sheet for Healthcare Providers: SeriousBroker.it  This test is not yet approved or cleared by the [place] FDA and has been authorized for detection and/or diagnosis of SARS-CoV-2 by FDA under an Emergency Use Authorization (EUA). This EUA will remain in effect (meaning this test can be used) for the duration of the COVID-19 declaration under Section 564(b)(1) of the Act, 21 U.S.C. section 360bbb-3(b)(1), unless the authorization is terminated or revoked.  Performed at [facility], [address], [place], [name] [address]   MRSA Next Gen by PCR, Nasal     Status: None   Collection Time: 07/11/21  6:45 AM  Result Value Ref Range Status   MRSA by PCR Next Gen NOT DETECTED NOT DETECTED Final    Comment: (NOTE) The GeneXpert MRSA Assay (FDA approved for NASAL specimens only), is one component of a comprehensive MRSA colonization surveillance program. It is not intended to diagnose MRSA infection nor to guide or monitor treatment for MRSA infections. Test performance is not FDA approved in patients less than 25 years old. Performed at [facility], [address] [place], [name] [address]      Radiology Studies: No results found.   Scheduled Meds:  aspirin EC  81 mg Oral Daily   atorvastatin  80 mg Oral q1800   buPROPion  150 mg  Oral BID   Chlorhexidine Gluconate Cloth  6 each Topical Q0600   clopidogrel  75 mg Oral Q breakfast   escitalopram  10 mg Oral q AM   fenofibrate  54 mg Oral Daily   heparin  5,000 Units Subcutaneous Q8H   insulin aspart  0-20 Units Subcutaneous TID WC   insulin aspart  0-5 Units Subcutaneous QHS   insulin aspart  6 Units Subcutaneous TID WC   insulin detemir  30 Units Subcutaneous BID   midodrine  5 mg Oral TID WC   pantoprazole  40 mg Oral Daily   pregabalin  150 mg Oral TID   Continuous Infusions:  vancomycin 200 mL/hr at 07/12/21 1527     LOS: 3 days    Time spent: 35 minutes.   Vassie Loll, MD [facility]   To contact the attending provider between 7A-7P or the covering provider during after hours 7P-7A, please log into the web site [email or web address] and access using universal [place] password for that web site. If you do not have the password, please call the hospital operator.  07/12/2021, 5:33 PM

## 2021-07-12 NOTE — TOC Initial Note (Signed)
Transition of Care [facility]) - Initial/Assessment Note    Patient Details  Name: Joseph [name] MRN: [id number] Date of Birth: [birth year]  [facility] CM/SW Contact:    Barry Brunner, LCSW Phone Number: 07/12/2021, 12:29 PM  Clinical Narrative:                 Patient is a 50 year old male admitted for Severe sepsis --Severe Sepsis secondary to Lt leg/Foot Cellulitis with hypotension and AKI. CSW observed patient's high readmission risk score. CSW conducted readmission risk assessment and initial assessment. Patient is ambulatory at baseline and is able to perform all ADL's independently. Patient expressed having difficulty managing his wounds. Patient declined HH due to having several large dogs at the residence. Patient agreeable to OP wound care. CSW will be able to schedule time for OP wound care during business hours on Monday. TOC to follow.   Expected Discharge Plan: Home w Home Health Services Barriers to Discharge: Continued Medical Work up   Patient Goals and CMS Choice   CMS Medicare.gov Compare Post Acute Care list provided to:: Patient Choice offered to / list presented to : Patient  Expected Discharge Plan and Services Expected Discharge Plan: Home w Home Health Services       Living arrangements for the past 2 months: Single Family Home                                      Prior Living Arrangements/Services Living arrangements for the past 2 months: Single Family Home Lives with:: Self Patient language and need for interpreter reviewed:: Yes        Need for Family Participation in Patient Care: Yes (Comment) Care giver support system in place?: Yes (comment)   Criminal Activity/Legal Involvement Pertinent to Current Situation/Hospitalization: No - Comment as needed  Activities of Daily Living Home Assistive Devices/Equipment: Cane (specify quad or straight), CBG Meter ADL Screening (condition at time of admission) Patient's cognitive  ability adequate to safely complete daily activities?: Yes Is the patient deaf or have difficulty hearing?: No Does the patient have difficulty seeing, even when wearing glasses/contacts?: Yes Does the patient have difficulty concentrating, remembering, or making decisions?: No Patient able to express need for assistance with ADLs?: Yes Does the patient have difficulty dressing or bathing?: No Independently performs ADLs?: Yes (appropriate for developmental age) Does the patient have difficulty walking or climbing stairs?: No Weakness of Legs: Both Weakness of Arms/Hands: Both  Permission Sought/Granted   Permission granted to share information with : Yes, Verbal Permission Granted     Permission granted to share info w AGENCY: Out patient wound care        Emotional Assessment     Affect (typically observed): Accepting, Adaptable, Appropriate Orientation: : Oriented to Situation, Oriented to Self, Oriented to Place, Oriented to  Time Alcohol / Substance Use: Not Applicable Psych Involvement: No (comment)  Admission diagnosis:  Hyperglycemia [R73.9] Poorly controlled diabetes mellitus (HCC) [E11.65] Cellulitis of left lower extremity [L03.116] Acute hyperglycemia [R73.9] Sepsis without acute organ dysfunction, due to unspecified organism [facility]) [A41.9] Patient Active Problem List   Diagnosis Date Noted   Acute hyperglycemia 07/09/2021   Left leg cellulitis 04/11/2021   Cellulitis of left leg 03/05/2021   Cellulitis 02/18/2021   PAD (peripheral artery disease) (HCC) 10/10/2020   Osteomyelitis of fourth toe of right foot (HCC)    S/P CABG x  4 07/24/2020   Non-ST elevation (NSTEMI) myocardial infarction (HCC) 07/22/2020   Acute on chronic diastolic heart failure (HCC) 07/22/2020   Hypotension 07/18/2020   Left leg swelling 07/18/2020   Elevated troponin 07/18/2020   AKI (acute kidney injury) (HCC) 07/18/2020   Elevated brain natriuretic peptide (BNP) level 07/18/2020    GERD (gastroesophageal reflux disease) 07/18/2020   Severe Sepsis secondary to Lt leg/Foot Cellulitis 07/18/2020   Severe sepsis --Severe Sepsis secondary to Lt leg/Foot Cellulitis with hypotension and AKI 07/18/2020   Unstable angina (HCC) 11/01/2019   Edema 06/13/2019   Leukocytosis 05/31/2019   Smoker 03/08/2019   Chronic pain 03/08/2019   Chronic back pain 02/16/2019   Deep venous thrombosis (HCC) 02/16/2019   Obesity, Class III, BMI 40-49.9 (morbid obesity) (HCC) 02/16/2019   Peripheral nerve disease 02/16/2019   COPD (chronic obstructive pulmonary disease) (HCC) 01/22/2019   Anxiety disorder 03/26/2018   Open wound of toe 10/12/2017   Sinusitis 10/12/2017   Abdominal pain    Loss of weight    Diabetic foot infection (HCC) 09/16/2017   Uncontrolled type 2 diabetes mellitus with hyperglycemia, with long-term current use of insulin (HCC) 09/16/2017   Cellulitis of right foot    Chronic ulcer of great toe of right foot (HCC) 09/09/2017   Recurrent chest pain 07/29/2016   Hyperglycemia    Insulin dependent type 2 diabetes mellitus (HCC) 04/29/2015   OSA (obstructive sleep apnea) 05/08/2013   History of pulmonary embolus (PE) 04/27/2013   CAD -S/P PCI 11/02/19  09/10/2008   Dyslipidemia 09/09/2008   PCP:  Marva Panda, NP Pharmacy:   [facility] [phone] - [place], [place] - [address] AT [facility] OF [address] [place] [address] Phone: [phone] Fax: [phone]  [id number] - [place], [place] - [address] AT SEC OF [address] [name] [address] Phone: [phone] Fax: [phone]     Social Determinants of Health (SDOH) Interventions    Readmission Risk Interventions Readmission Risk Prevention Plan 07/12/2021  Transportation Screening Complete  Medication Review [facility]) Complete  PCP or Specialist appointment within 3-5 days of  discharge Complete  HRI or Home Care Consult Complete  SW Recovery Care/Counseling Consult Complete  Palliative Care Screening Not Applicable  Skilled Nursing Facility Not Applicable  Some recent data might be hidden

## 2021-07-12 NOTE — Progress Notes (Signed)
Daily and Q shift dressing change performed. Pt in no distress during dressing change. Will continue to monitor.

## 2021-07-12 NOTE — Consult Note (Signed)
WOC Nurse Consult Note: Reason for Consult: Chronic, nonhealing full thickness neuropathic foot ulcer at 3rd metatarsal head, interdigital moisture associated skin damage, infection (cellulitis) to left anterior tibial area. Photos taken and uploaded to the EMR by provider are appreciated. Wound type: neuropathy, moisture, infection Pressure Injury POA:N/A Measurement:Per Nursing Flow Sheet.  Wound bed:dry, red Drainage (amount, consistency, odor) scant serous Periwound:erythema, mild edema to LE, erythema to foot Dressing procedure/placement/frequency: I have provided guidance for Nursing in the care of these wounds using a twice daily saline dressing to the foot and xeroform dressing to the pretibial area. Heel is to be floated and a sacral prophylactic foam dressing used to the sacrum for Pressure injury prevention.  Consider consultation with Vascular in the past as well as Ortho.  Recommend patient follow up as previously determined with those providers post discharge.  WOC nursing team will not follow, but will remain available to this patient, the nursing and medical teams.  Please re-consult if needed. Thanks, Ladona Mow, MSN, RN, GNP, [place]  Pager# [phone]

## 2021-07-13 DIAGNOSIS — L089 Local infection of the skin and subcutaneous tissue, unspecified: Secondary | ICD-10-CM

## 2021-07-13 DIAGNOSIS — E11628 Type 2 diabetes mellitus with other skin complications: Secondary | ICD-10-CM | POA: Diagnosis not present

## 2021-07-13 DIAGNOSIS — E119 Type 2 diabetes mellitus without complications: Secondary | ICD-10-CM | POA: Diagnosis not present

## 2021-07-13 DIAGNOSIS — G4733 Obstructive sleep apnea (adult) (pediatric): Secondary | ICD-10-CM

## 2021-07-13 DIAGNOSIS — L03116 Cellulitis of left lower limb: Secondary | ICD-10-CM | POA: Diagnosis not present

## 2021-07-13 DIAGNOSIS — E1165 Type 2 diabetes mellitus with hyperglycemia: Secondary | ICD-10-CM | POA: Diagnosis not present

## 2021-07-13 LAB — GLUCOSE, CAPILLARY
Glucose-Capillary: 298 mg/dL — ABNORMAL HIGH (ref 70–99)
Glucose-Capillary: 359 mg/dL — ABNORMAL HIGH (ref 70–99)
Glucose-Capillary: 338 mg/dL — ABNORMAL HIGH (ref 70–99)

## 2021-07-13 LAB — BASIC METABOLIC PANEL
Anion gap: 6 (ref 5–15)
BUN: 18 mg/dL (ref 6–20)
CO2: 27 mmol/L (ref 22–32)
Calcium: 8.6 mg/dL — ABNORMAL LOW (ref 8.9–10.3)
Chloride: 102 mmol/L (ref 98–111)
Creatinine, Ser: 1.19 mg/dL (ref 0.61–1.24)
GFR, Estimated: >60 mL/min (ref >60)
Glucose, Bld: 298 mg/dL — ABNORMAL HIGH (ref 70–99)
Potassium: 3.9 mmol/L (ref 3.5–5.1)
Sodium: 135 mmol/L (ref 135–145)

## 2021-07-13 MED ORDER — INSULIN DETEMIR 100 UNIT/ML FLEXPEN: 35.0000 [IU] | PEN_INJECTOR | Freq: Two times a day (BID) | SUBCUTANEOUS | 4 refills | Status: DC

## 2021-07-13 MED ORDER — DOXYCYCLINE HYCLATE 100 MG PO TABS: 100.0000 mg | ORAL_TABLET | Freq: Two times a day (BID) | ORAL | 0 refills | Status: DC

## 2021-07-13 MED ORDER — INSULIN ASPART 100 UNIT/ML FLEXPEN: 12.0000 [IU] | PEN_INJECTOR | Freq: Three times a day (TID) | SUBCUTANEOUS | 3 refills | Status: DC

## 2021-07-13 MED ORDER — DOXYCYCLINE HYCLATE 100 MG PO TABS
100.0000 mg | ORAL_TABLET | Freq: Two times a day (BID) | ORAL | Status: DC
  Filled 2021-07-13 (×4): qty 1

## 2021-07-13 NOTE — Progress Notes (Signed)
Inpatient Diabetes Program Recommendations  AACE/ADA: New Consensus Statement on Inpatient Glycemic Control   Target Ranges:  Prepandial:   less than 140 mg/dL      Peak postprandial:   less than 180 mg/dL (1-2 hours)      Critically ill patients:  140 - 180 mg/dL   Results for Joseph [name], Joseph [name] (MRN [id number]) as of 07/13/2021 09:09  Ref. Range 07/12/2021 07:24 07/12/2021 11:04 07/12/2021 16:19 07/12/2021 20:18 07/13/2021 07:29  Glucose-Capillary Latest Ref Range: 70 - 99 mg/dL 048 (H) 889 (H) 169 (H) 326 (H) 298 (H)   Review of Glycemic Control  Diabetes history: DM2 Outpatient Diabetes medications: Lantus 46 units QAM, Lantus 52 units QHS, Humalog 0-12 TID, Metformin 1000 mg BID Current orders for Inpatient glycemic control: Levemir 30 units BID, NOvolog 0-20 units TID with meals, Novolog 0-5 units QHS, Novolog 6 units TID with meals  Inpatient Diabetes Program Recommendations:    Insulin: Please consider increasing Levemir to 40 units BID and meal coverage to Novolog 12 units TID with meals.  Everlena Cooper, RN, MSN, CDE Diabetes Coordinator Inpatient Diabetes Program [phone] (Team Pager from 8am to 5pm)

## 2021-07-13 NOTE — Discharge Summary (Signed)
Physician Discharge Summary  [name] AGT:[id number] DOB: Oct 20, 1971 DOA: 07/09/2021  PCP: [name], NP  Admit date: 07/09/2021 Discharge date: 07/13/2021  Time spent: 35 minutes  Recommendations for Outpatient Follow-up:  Repeat basic metabolic panel to follow electrolytes and renal function Close monitoring of patient's CBGs with further adjustment to hypoglycemia regimen as needed. Continue assisting patient with weight loss management and make sure he has follow-up as instructed with wound care services and Dr. Sharol Given.   Discharge Diagnoses:  Active Problems:   Insulin dependent type 2 diabetes mellitus (HCC)   Severe sepsis --Severe Sepsis secondary to Lt leg/Foot Cellulitis with hypotension and AKI   Left leg cellulitis   Acute hyperglycemia   Discharge Condition: Stable and improved.  Discharged home with instruction to follow-up with PCP and with Dr. Sharol Given as an outpatient  CODE STATUS: Full code  Diet recommendation: Heart healthy modified carbohydrate diet.  Patient also instructed to watch calorie and portion control.  Filed Weights   07/09/21 2050 07/10/21 0546 07/12/21 2159  Weight: (!) 158.8 kg (!) 163.5 kg (!) 169.5 kg    History of present illness:  Joseph [name]  is a 50 y.o. male,with history of anxiety, asthma, CAD, chronic bronchitis, COPD, HLD, HTN, MI, T2DM, and more presents to the ED with a chief complaint of body pains. He reports that he has right shoulder pain, left rib pain, and erythema of the left lower extremity.  Patient reports that out of these 3 complaints he is most concerned with the left rib pain.  He reports that it is relieved by nitro, but he does not like to take nitro because it gives him a headache.  He reports that the pain started 2.5-3 days ago.  He reports he cannot remember the details 100% as he has had memory issues since his CABG almost a year ago.  He has not had any falls on his ribs, no heavy lifting.  He reports that  the pain radiates all the way across to his chest past midline and over to the right.  He describes the pain as a squeezing and sharp pain like somebody is grabbing him who has sharp fingernails.  The pain is constant, worse with deep inspiration.  Laying on his left side helps relieve the pain.  When discussing his leg he is less concerned.  He reports that he has had cellulitis of the left lower extremity for almost 2 years.  He reports he has been on antibiotics constantly, but most recently was cefdinir.  He is currently 5 to 6 days into the cefdinir course.  He denies any fevers.  He does have nausea and vomiting without hematemesis.  Patient reports that there is no drainage from his left lower extremity but that it has been more erythematous than normal.   Patient reports he has not been following his sugars at home because he did not have a meter.  He does take Lantus 60 units twice daily and a sliding scale of Humalog.   Patient does not smoke, drinks occasionally, does not use illicit drugs.  He is not vaccinated for COVID.  Patient is full code.  Hospital Course:  1-HONK -in the setting of uncontrolled insulin dependent type 2 diabetes mellitus ([place]) -Patient successfully treated with insulin drip and -CBGs intermittently fluctuating and reaching 200 range but stable overall. -Discharge home on NovoLog and Levemir -Advised to follow modified carbohydrate diet and maintain adequate hydration -Will need close monitoring of CBGs with further  adjustment to hypoglycemic regimen as needed. -Will Resume oral metformin at discharge.  2-Severe sepsis --Severe Sepsis secondary to Lt leg/Foot Cellulitis with hypotension and AKI -Excellent response to IV antibiotics -Patient discharged on oral doxycycline to complete therapy; instructed to keep leg elevated and to follow-up with wound care and to follow-up with Dr. Sharol Given. -No osteomyelitis appreciated on x-ray -Continue judicious use of analgesic  therapy -Continue to follow recommendations by wound care service.   3-history of coronary artery disease -No chest pain -Continue Plavix, aspirin, and statins   4-gastroesophageal disease -Continue PPI.   5-obstructive sleep apnea/obesity -Body mass index is 42.74 kg/m. -Low calorie diet, portion control and increase physical activity discussed with patient -Patient will need outpatient sleep study. -Good response to automated CPAP mode while hospitalized.   6-depression/anxiety -Continue Lexapro and appropriate -Continue judicious use of as needed benzodiazepine -Continue outpatient follow-up with psychiatry service.   7-lactic acidosis -In the setting of severe sepsis and HONK/dehydration -Resolved at time of discharge -Patient advised to maintain adequate hydration.   8-chronic pain syndrome -Continue judicious usage of opiates. -Patient oriented x3 and following commands appropriately. -No prescriptions for opiate provided at discharge.  Procedures: See below for x-ray report.  Consultations: Wound care service  Discharge Exam: Vitals:   07/13/21 1000 07/13/21 1334  BP: (!) 148/88 (!) 141/85  Pulse: 82 85  Resp: 18 18  Temp: 98.6 F (37 C)   SpO2: 96% 93%    General: Afebrile, no chest pain, no nausea, no vomiting.  Still having intermittent discomfort in his left lower extremity but with improvement in cellulitic changes and decrease drainage.  Patient is ready to go home. Cardiovascular: No rubs, no gallops, no murmurs on exam.  RRR.  No JVD  Respiratory: Good air movement bilaterally; no using accessory muscle.  Good saturation on room air. Abdomen: Soft, nontender, positive bowel sounds Extremities/skin: Demonstrating no cyanosis or clubbing; multiple amputations of toes on both feet; improved erythematous erythematous changes and swelling in his left lower extremity with also improvement in drainage around left plantar wound.  Still with intermittent  pain described.  Discharge Instructions   Discharge Instructions     Ambulatory referral to Wound Clinic   Complete by: As directed    Diet - low sodium heart healthy   Complete by: As directed    Diet Carb Modified   Complete by: As directed    Discharge instructions   Complete by: As directed    Be compliant with insulin therapy and modify carbohydrate diet Take medication as prescribed Maintain adequate hydration Make sure to keep leg elevated and follow wound care instructions Arrange follow-up with PCP in 10 days Follow-up with Dr. Sharol Given in 1-2 weeks.   Discharge wound care:   Complete by: As directed    Cleans live anterior lower extremity with soap and water, rinse and pat dry.  Cover affected area with Xeroform gauze and top with ABD pad and secured with Kerlix roll gauze.  Left plantar aspect open wound; cleaned with soap and water, rinse and pat dry.  Cover lesion with saline moistened gauze 2 x 2 and Telfa with a dry gauze and secure with Kerlix and paper tape.  Dressing changes to be done at least once a day.      Allergies as of 07/13/2021       Reactions   Sulfa Antibiotics Other (See Comments)   Headaches        Medication List     STOP  taking these medications    cephALEXin 500 MG capsule Commonly known as: KEFLEX   ciprofloxacin 500 MG tablet Commonly known as: CIPRO   fluconazole 100 MG tablet Commonly known as: DIFLUCAN   insulin lispro 100 UNIT/ML KwikPen Commonly known as: HumaLOG KwikPen   Lantus SoloStar 100 UNIT/ML Solostar Pen Generic drug: insulin glargine   lidocaine 5 % Commonly known as: LIDODERM   Victoza 18 MG/3ML Sopn Generic drug: liraglutide       TAKE these medications    albuterol 108 (90 Base) MCG/ACT inhaler Commonly known as: VENTOLIN HFA Inhale 2 puffs into the lungs every 6 (six) hours as needed for wheezing or shortness of breath.   ALPRAZolam 1 MG tablet Commonly known as: XANAX Take 1 mg by mouth  See admin instructions. 1 tablet by mouth twice daily and 2 tabs at bedtime.   aspirin EC 81 MG tablet Take 1 tablet (81 mg total) by mouth daily. Swallow whole.   atorvastatin 80 MG tablet Commonly known as: LIPITOR Take 1 tablet (80 mg total) by mouth daily at 6 PM.   buPROPion 150 MG 12 hr tablet Commonly known as: WELLBUTRIN SR Take 150 mg by mouth 2 (two) times daily.   carvedilol 3.125 MG tablet Commonly known as: COREG Take 3.125 mg by mouth 2 (two) times daily.   clopidogrel 75 MG tablet Commonly known as: PLAVIX Take 1 tablet (75 mg total) by mouth daily with breakfast.   doxycycline 100 MG tablet Commonly known as: VIBRA-TABS Take 1 tablet (100 mg total) by mouth 2 (two) times daily for 10 days. Take on full stomach. Wear sunscreen   escitalopram 10 MG tablet Commonly known as: LEXAPRO Take 10 mg by mouth in the morning.   fenofibrate 145 MG tablet Commonly known as: TRICOR Take 145 mg by mouth in the morning.   Flovent Diskus 250 MCG/BLIST Aepb Generic drug: Fluticasone Propionate (Inhal) Inhale 2 puffs into the lungs in the morning and at bedtime.   fluticasone 50 MCG/ACT nasal spray Commonly known as: FLONASE Place 2 sprays into both nostrils in the morning.   furosemide 40 MG tablet Commonly known as: LASIX Take 1 tablet (40 mg total) by mouth daily.   insulin aspart 100 UNIT/ML FlexPen Commonly known as: NOVOLOG Inject 12 Units into the skin 3 (three) times daily with meals.   insulin detemir 100 UNIT/ML FlexPen Commonly known as: LEVEMIR Inject 35 Units into the skin 2 (two) times daily.   meclizine 25 MG tablet Commonly known as: ANTIVERT Take 25 mg by mouth 3 (three) times daily as needed for dizziness. Take 50 mg by mouth in the morning & 25 mg by mouth at night   metFORMIN 1000 MG tablet Commonly known as: GLUCOPHAGE Take 1 tablet (1,000 mg total) by mouth 2 (two) times daily with a meal.   midodrine 5 MG tablet Commonly known as:  PROAMATINE Take 5 mg by mouth 3 (three) times daily with meals.   mupirocin ointment 2 % Commonly known as: BACTROBAN APPLY TOPICALLY TO THE AFFECTED AREA DAILY   naloxone 4 MG/0.1ML Liqd nasal spray kit Commonly known as: NARCAN Place 4 mg into the nose as needed (overdose).   nitroGLYCERIN 0.4 MG SL tablet Commonly known as: NITROSTAT Place 0.4 mg under the tongue every 5 (five) minutes as needed for chest pain.   oxyCODONE 15 MG immediate release tablet Commonly known as: ROXICODONE Take 1 tablet (15 mg total) by mouth 5 (five) times daily.   pantoprazole  40 MG tablet Commonly known as: PROTONIX Take 1 tablet (40 mg total) by mouth daily. What changed:  how much to take when to take this   pregabalin 150 MG capsule Commonly known as: LYRICA Take 150 mg by mouth 3 (three) times daily.   promethazine 25 MG tablet Commonly known as: PHENERGAN Take 25 mg by mouth every 4 (four) hours as needed for nausea or vomiting.               Discharge Care Instructions  (From admission, onward)           Start     Ordered   07/13/21 0000  Discharge wound care:       Comments: Cleans live anterior lower extremity with soap and water, rinse and pat dry.  Cover affected area with Xeroform gauze and top with ABD pad and secured with Kerlix roll gauze.  Left plantar aspect open wound; cleaned with soap and water, rinse and pat dry.  Cover lesion with saline moistened gauze 2 x 2 and Telfa with a dry gauze and secure with Kerlix and paper tape.  Dressing changes to be done at least once a day.   07/13/21 1524           Allergies  Allergen Reactions   Sulfa Antibiotics Other (See Comments)    Headaches     Follow-up Information     [facility] Follow up.   Specialty: Rehabilitation Why: Wound Care Contact information: [place] [phone] [place]        [name], NP. Schedule an appointment as soon as possible for a visit in 2 week(s).   Contact information: [place] [address] [phone]         Skeet Latch, MD .   Specialty: Cardiology Contact information: [address] [place] [address] [phone]         Newt Minion, MD. Schedule an appointment as soon as possible for a visit in 10 day(s).   Specialty: Orthopedic Surgery Contact information: [place] [address] [phone]                 The results of significant diagnostics from this hospitalization (including imaging, microbiology, ancillary and laboratory) are listed below for reference.    Significant Diagnostic Studies: DG Chest 2 View  Result Date: 07/09/2021 CLINICAL DATA:  Chest pain EXAM: CHEST - 2 VIEW COMPARISON:  04/11/2021 FINDINGS: Lungs are clear. No pneumothorax or pleural effusion. Coronary artery bypass grafting has been performed. Cardiac size within normal limits. No acute bone abnormality. IMPRESSION: No active cardiopulmonary disease. Electronically Signed   By: Fidela Salisbury MD   On: 07/09/2021 22:24   DG Foot Complete Left  Result Date: 07/09/2021 CLINICAL DATA:  Ulcer at the third metatarsal area for a few days. Left foot swelling. Question osteomyelitis. EXAM: LEFT FOOT - COMPLETE 3+ VIEW COMPARISON:  Radiograph 02/18/2021 FINDINGS: Prior transmetatarsal amputation of the fourth ray. Soft tissue defect in the plantar soft tissues at the level of the third metatarsal phalangeal joint. No subjacent bony destruction, erosion, or abnormal bone density to suggest osteomyelitis. No radiopaque foreign body. Slight hammertoe deformity of the second and third toes. No fracture. Plantar calcaneal spur and minimal hindfoot degenerative change. IMPRESSION: 1. Soft tissue defect in the plantar soft tissues at the level of the third metatarsophalangeal joint consistent with ulcer.  No  radiographic evidence of osteomyelitis. No radiopaque foreign body. 2. Prior transmetatarsal amputation of the fourth ray. Electronically Signed   By: Keith Rake M.D.   On: 07/09/2021 23:49    Microbiology: Recent Results (from the past 240 hour(s))  Culture, blood (routine x 2)     Status: None (Preliminary result)   Collection Time: 07/09/21  9:38 PM   Specimen: BLOOD RIGHT ARM  Result Value Ref Range Status   Specimen Description BLOOD RIGHT ARM  Final   Special Requests   Final    BOTTLES DRAWN AEROBIC AND ANAEROBIC Blood Culture results may not be optimal due to an excessive volume of blood received in culture bottles   Culture   Final    NO GROWTH 4 DAYS Performed at [facility], [address], [place], [place] [address]    Report Status PENDING  Incomplete  Culture, blood (routine x 2)     Status: None (Preliminary result)   Collection Time: 07/09/21  9:43 PM   Specimen: BLOOD LEFT ARM  Result Value Ref Range Status   Specimen Description BLOOD LEFT ARM  Final   Special Requests   Final    BOTTLES DRAWN AEROBIC AND ANAEROBIC Blood Culture adequate volume   Culture   Final    NO GROWTH 4 DAYS Performed at [facility], [address], [place], [place] [address]    Report Status PENDING  Incomplete  Resp Panel by RT-PCR (Flu A&B, Covid) Nasopharyngeal Swab     Status: None   Collection Time: 07/10/21  2:33 AM   Specimen: Nasopharyngeal Swab; Nasopharyngeal(NP) swabs in vial transport medium  Result Value Ref Range Status   SARS Coronavirus 2 by RT PCR NEGATIVE NEGATIVE Final    Comment: (NOTE) SARS-CoV-2 target nucleic acids are NOT DETECTED.  The SARS-CoV-2 RNA is generally detectable in upper respiratory specimens during the acute phase of infection. The lowest concentration of SARS-CoV-2 viral copies this assay can detect is 138 copies/mL. A negative result does not preclude SARS-Cov-2 infection and should not be used as the sole basis for treatment or other  patient management decisions. A negative result may occur with  improper specimen collection/handling, submission of specimen other than nasopharyngeal swab, presence of viral mutation(s) within the areas targeted by this assay, and inadequate number of viral copies(<138 copies/mL). A negative result must be combined with clinical observations, patient history, and epidemiological information. The expected result is Negative.  Fact Sheet for Patients:  EntrepreneurPulse.com.au  Fact Sheet for Healthcare Providers:  IncredibleEmployment.be  This test is no t yet approved or cleared by the [place] FDA and  has been authorized for detection and/or diagnosis of SARS-CoV-2 by FDA under an Emergency Use Authorization (EUA). This EUA will remain  in effect (meaning this test can be used) for the duration of the COVID-19 declaration under Section 564(b)(1) of the Act, 21 U.S.C.section 360bbb-3(b)(1), unless the authorization is terminated  or revoked sooner.       Influenza A by PCR NEGATIVE NEGATIVE Final   Influenza B by PCR NEGATIVE NEGATIVE Final    Comment: (NOTE) The Xpert Xpress SARS-CoV-2/FLU/RSV plus assay is intended as an aid in the diagnosis of influenza from Nasopharyngeal swab specimens and should not be used as a sole basis for treatment. Nasal washings and aspirates are unacceptable for Xpert Xpress SARS-CoV-2/FLU/RSV testing.  Fact Sheet for Patients: EntrepreneurPulse.com.au  Fact Sheet for Healthcare Providers: IncredibleEmployment.be  This test is not yet approved or cleared by the [place] FDA  and has been authorized for detection and/or diagnosis of SARS-CoV-2 by FDA under an Emergency Use Authorization (EUA). This EUA will remain in effect (meaning this test can be used) for the duration of the COVID-19 declaration under Section 564(b)(1) of the Act, 21 U.S.C. section  360bbb-3(b)(1), unless the authorization is terminated or revoked.  Performed at [facility], [address] [place], [place] [address]   MRSA Next Gen by PCR, Nasal     Status: None   Collection Time: 07/11/21  6:45 AM  Result Value Ref Range Status   MRSA by PCR Next Gen NOT DETECTED NOT DETECTED Final    Comment: (NOTE) The GeneXpert MRSA Assay (FDA approved for NASAL specimens only), is one component of a comprehensive MRSA colonization surveillance program. It is not intended to diagnose MRSA infection nor to guide or monitor treatment for MRSA infections. Test performance is not FDA approved in patients less than 40 years old. Performed at [facility], [address], [place], [place] [address]      Labs: Basic Metabolic Panel: Recent Labs  Lab 07/09/21 2138 07/10/21 0410 07/11/21 0421 07/12/21 0541 07/13/21 0610  NA 125* 134* 134* 134* 135  K 3.7 3.4* 3.8 3.9 3.9  CL 90* 99 100 98 102  CO2 _0 GLUCOSE 821* 328* 347* 338* 298*  BUN _1 CREATININE 1.72* 1.44* 1.31* 1.25* 1.19  CALCIUM 7.9* 8.4* 8.0* 8.4* 8.6*  MG  --  1.5*  --   --   --    Liver Function Tests: Recent Labs  Lab 07/09/21 2138  AST 30  ALT 27  ALKPHOS 99  BILITOT 0.7  PROT 6.5  ALBUMIN 2.7*   Recent Labs  Lab 07/09/21 2138  LIPASE 28   CBC: Recent Labs  Lab 07/09/21 2138 07/12/21 0541  WBC 7.8 7.2  NEUTROABS 5.6  --   HGB 11.8* 12.6*  HCT 36.7* 40.3  MCV 83.6 84.7  PLT 190 239    BNP (last 3 results) Recent Labs    07/18/20 0100 07/27/20 0331 11/20/20 1253  BNP 199.0* 271.0* 77.9    CBG: Recent Labs  Lab 07/12/21 1104 07/12/21 1619 07/12/21 2018 07/13/21 0729 07/13/21 1104  GLUCAP 362* 340* 326* 298* 359*    Signed:  Barton Dubois MD.  [facility] 07/13/2021, 3:28 PM

## 2021-07-13 NOTE — TOC Transition Note (Signed)
Transition of Care [facility]) - CM/SW Discharge Note   Patient Details  Name: Joseph [name] MRN: [id number] Date of Birth: 01-01-1971  Transition of Care [facility]) CM/SW Contact:  Barry Brunner, LCSW Phone Number: 07/13/2021, 1:05 PM   Clinical Narrative:    CSW referred patient to OP wound care with [facility]. Rep with [facility] reported that referral received and they have called to follow up with patient. TOC signing off.   Final next level of care: OP Rehab Barriers to Discharge: Barriers Resolved   Patient Goals and CMS Choice Patient states their goals for this hospitalization and ongoing recovery are:: return home with OP wound care CMS Medicare.gov Compare Post Acute Care list provided to:: Patient Choice offered to / list presented to : Patient  Discharge Placement                    Patient and family notified of of transfer: 07/13/21  Discharge Plan and Services                                     Social Determinants of Health (SDOH) Interventions     Readmission Risk Interventions Readmission Risk Prevention Plan 07/12/2021  Transportation Screening Complete  Medication Review [employer]) Complete  PCP or Specialist appointment within 3-5 days of discharge Complete  HRI or Home Care Consult Complete  SW Recovery Care/Counseling Consult Complete  Palliative Care Screening Not Applicable  Skilled Nursing Facility Not Applicable  Some recent data might be hidden

## 2021-07-13 NOTE — Care Management Important Message (Signed)
Important Message  Patient Details  Name: Joseph [name] MRN: [id number] Date of Birth: 08/27/1971   Medicare Important Message Given:  Yes     Corey Harold 07/13/2021, 11:46 AM

## 2021-07-14 LAB — CULTURE, BLOOD (ROUTINE X 2)
Culture: NO GROWTH
Culture: NO GROWTH
Special Requests: ADEQUATE

## 2021-07-16 ENCOUNTER — Other Ambulatory Visit: Payer: Self-pay

## 2021-07-16 ENCOUNTER — Observation Stay (HOSPITAL_COMMUNITY)
Admission: EM | Admit: 2021-07-16 | Discharge: 2021-07-17 | Disposition: A | Discharge status: Home / Self Care | Payer: Medicare HMO | Attending: Emergency Medicine | Admitting: Emergency Medicine

## 2021-07-16 ENCOUNTER — Emergency Department (HOSPITAL_COMMUNITY): Payer: Medicare HMO

## 2021-07-16 ENCOUNTER — Encounter (HOSPITAL_COMMUNITY): Payer: Self-pay | Admitting: Emergency Medicine

## 2021-07-16 DIAGNOSIS — F32A Depression, unspecified: Secondary | ICD-10-CM | POA: Diagnosis present

## 2021-07-16 DIAGNOSIS — N179 Acute kidney failure, unspecified: Secondary | ICD-10-CM | POA: Diagnosis present

## 2021-07-16 DIAGNOSIS — R0789 Other chest pain: Principal | ICD-10-CM | POA: Diagnosis present

## 2021-07-16 DIAGNOSIS — Z87891 Personal history of nicotine dependence: Secondary | ICD-10-CM | POA: Insufficient documentation

## 2021-07-16 DIAGNOSIS — Z951 Presence of aortocoronary bypass graft: Secondary | ICD-10-CM | POA: Insufficient documentation

## 2021-07-16 DIAGNOSIS — Z7901 Long term (current) use of anticoagulants: Secondary | ICD-10-CM | POA: Insufficient documentation

## 2021-07-16 DIAGNOSIS — R739 Hyperglycemia, unspecified: Secondary | ICD-10-CM | POA: Diagnosis present

## 2021-07-16 DIAGNOSIS — Z20822 Contact with and (suspected) exposure to covid-19: Secondary | ICD-10-CM | POA: Insufficient documentation

## 2021-07-16 DIAGNOSIS — Z86711 Personal history of pulmonary embolism: Secondary | ICD-10-CM | POA: Diagnosis not present

## 2021-07-16 DIAGNOSIS — Z7984 Long term (current) use of oral hypoglycemic drugs: Secondary | ICD-10-CM | POA: Insufficient documentation

## 2021-07-16 DIAGNOSIS — I2510 Atherosclerotic heart disease of native coronary artery without angina pectoris: Secondary | ICD-10-CM | POA: Diagnosis not present

## 2021-07-16 DIAGNOSIS — Z794 Long term (current) use of insulin: Secondary | ICD-10-CM | POA: Insufficient documentation

## 2021-07-16 DIAGNOSIS — I5033 Acute on chronic diastolic (congestive) heart failure: Secondary | ICD-10-CM | POA: Diagnosis not present

## 2021-07-16 DIAGNOSIS — F419 Anxiety disorder, unspecified: Secondary | ICD-10-CM | POA: Diagnosis present

## 2021-07-16 DIAGNOSIS — E871 Hypo-osmolality and hyponatremia: Secondary | ICD-10-CM

## 2021-07-16 DIAGNOSIS — Z7982 Long term (current) use of aspirin: Secondary | ICD-10-CM | POA: Diagnosis not present

## 2021-07-16 DIAGNOSIS — I110 Hypertensive heart disease with heart failure: Secondary | ICD-10-CM | POA: Diagnosis not present

## 2021-07-16 DIAGNOSIS — E1165 Type 2 diabetes mellitus with hyperglycemia: Secondary | ICD-10-CM | POA: Insufficient documentation

## 2021-07-16 DIAGNOSIS — J449 Chronic obstructive pulmonary disease, unspecified: Secondary | ICD-10-CM | POA: Diagnosis not present

## 2021-07-16 DIAGNOSIS — J45909 Unspecified asthma, uncomplicated: Secondary | ICD-10-CM | POA: Insufficient documentation

## 2021-07-16 DIAGNOSIS — I10 Essential (primary) hypertension: Secondary | ICD-10-CM

## 2021-07-16 DIAGNOSIS — K219 Gastro-esophageal reflux disease without esophagitis: Secondary | ICD-10-CM | POA: Diagnosis present

## 2021-07-16 DIAGNOSIS — E785 Hyperlipidemia, unspecified: Secondary | ICD-10-CM

## 2021-07-16 DIAGNOSIS — R072 Precordial pain: Secondary | ICD-10-CM

## 2021-07-16 DIAGNOSIS — E119 Type 2 diabetes mellitus without complications: Secondary | ICD-10-CM

## 2021-07-16 LAB — BASIC METABOLIC PANEL
Anion gap: 12 (ref 5–15)
BUN: 12 mg/dL (ref 6–20)
CO2: 24 mmol/L (ref 22–32)
Calcium: 8.9 mg/dL (ref 8.9–10.3)
Chloride: 94 mmol/L — ABNORMAL LOW (ref 98–111)
Creatinine, Ser: 1.63 mg/dL — ABNORMAL HIGH (ref 0.61–1.24)
GFR, Estimated: 52 mL/min — ABNORMAL LOW (ref >60)
Glucose, Bld: 680 mg/dL (ref 70–99)
Potassium: 4.1 mmol/L (ref 3.5–5.1)
Sodium: 130 mmol/L — ABNORMAL LOW (ref 135–145)

## 2021-07-16 LAB — CBC
HCT: 41 % (ref 39.0–52.0)
Hemoglobin: 13.4 g/dL (ref 13.0–17.0)
MCH: 26.9 pg (ref 26.0–34.0)
MCHC: 32.7 g/dL (ref 30.0–36.0)
MCV: 82.3 fL (ref 80.0–100.0)
Platelets: 307 10*3/uL (ref 150–400)
RBC: 4.98 MIL/uL (ref 4.22–5.81)
RDW: 14.8 % (ref 11.5–15.5)
WBC: 7.4 10*3/uL (ref 4.0–10.5)
nRBC: 0 % (ref 0.0–0.2)

## 2021-07-16 LAB — TROPONIN I (HIGH SENSITIVITY): Troponin I (High Sensitivity): 15 ng/L (ref <18)

## 2021-07-16 MED ORDER — ASPIRIN 325 MG PO TABS
325.0000 mg | ORAL_TABLET | Freq: Once | ORAL | Status: AC
  Administered 2021-07-16: 325 mg via ORAL
  Filled 2021-07-16: qty 1

## 2021-07-16 MED ORDER — ONDANSETRON HCL 4 MG/2ML IJ SOLN
4.0000 mg | Freq: Once | INTRAMUSCULAR | Status: AC
  Administered 2021-07-16: 4 mg via INTRAVENOUS
  Filled 2021-07-16: qty 2

## 2021-07-16 MED ORDER — MORPHINE SULFATE (PF) 4 MG/ML IV SOLN
4.0000 mg | Freq: Once | INTRAVENOUS | Status: AC
  Administered 2021-07-16: 4 mg via INTRAVENOUS
  Filled 2021-07-16: qty 1

## 2021-07-16 MED ORDER — INSULIN ASPART 100 UNIT/ML IV SOLN
10.0000 [IU] | Freq: Once | INTRAVENOUS | Status: AC
  Administered 2021-07-16: 10 [IU] via INTRAVENOUS

## 2021-07-16 MED ORDER — POTASSIUM CHLORIDE CRYS ER 20 MEQ PO TBCR
40.0000 meq | EXTENDED_RELEASE_TABLET | Freq: Once | ORAL | Status: AC
  Administered 2021-07-16: 40 meq via ORAL
  Filled 2021-07-16: qty 2
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
## 2021-07-16 NOTE — ED Notes (Signed)
Pt using urinal.

## 2021-07-16 NOTE — ED Notes (Signed)
Patient transported to X-ray 
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
## 2021-07-17 DIAGNOSIS — J449 Chronic obstructive pulmonary disease, unspecified: Secondary | ICD-10-CM

## 2021-07-17 DIAGNOSIS — K219 Gastro-esophageal reflux disease without esophagitis: Secondary | ICD-10-CM

## 2021-07-17 DIAGNOSIS — S81802A Unspecified open wound, left lower leg, initial encounter: Secondary | ICD-10-CM | POA: Insufficient documentation

## 2021-07-17 DIAGNOSIS — I10 Essential (primary) hypertension: Secondary | ICD-10-CM

## 2021-07-17 DIAGNOSIS — E871 Hypo-osmolality and hyponatremia: Secondary | ICD-10-CM

## 2021-07-17 DIAGNOSIS — N179 Acute kidney failure, unspecified: Secondary | ICD-10-CM

## 2021-07-17 DIAGNOSIS — R0789 Other chest pain: Secondary | ICD-10-CM | POA: Diagnosis not present

## 2021-07-17 DIAGNOSIS — R739 Hyperglycemia, unspecified: Secondary | ICD-10-CM

## 2021-07-17 DIAGNOSIS — E119 Type 2 diabetes mellitus without complications: Secondary | ICD-10-CM

## 2021-07-17 DIAGNOSIS — E782 Mixed hyperlipidemia: Secondary | ICD-10-CM

## 2021-07-17 DIAGNOSIS — Z9861 Coronary angioplasty status: Secondary | ICD-10-CM

## 2021-07-17 DIAGNOSIS — I2510 Atherosclerotic heart disease of native coronary artery without angina pectoris: Secondary | ICD-10-CM

## 2021-07-17 DIAGNOSIS — Z794 Long term (current) use of insulin: Secondary | ICD-10-CM

## 2021-07-17 DIAGNOSIS — F419 Anxiety disorder, unspecified: Secondary | ICD-10-CM | POA: Diagnosis not present

## 2021-07-17 LAB — BASIC METABOLIC PANEL
Anion gap: 8 (ref 5–15)
BUN: 13 mg/dL (ref 6–20)
CO2: 28 mmol/L (ref 22–32)
Calcium: 8.5 mg/dL — ABNORMAL LOW (ref 8.9–10.3)
Chloride: 99 mmol/L (ref 98–111)
Creatinine, Ser: 1.39 mg/dL — ABNORMAL HIGH (ref 0.61–1.24)
GFR, Estimated: >60 mL/min (ref >60)
Glucose, Bld: 329 mg/dL — ABNORMAL HIGH (ref 70–99)
Potassium: 3.4 mmol/L — ABNORMAL LOW (ref 3.5–5.1)
Sodium: 135 mmol/L (ref 135–145)

## 2021-07-17 LAB — COMPREHENSIVE METABOLIC PANEL
ALT: 24 U/L (ref 0–44)
AST: 25 U/L (ref 15–41)
Albumin: 3.1 g/dL — ABNORMAL LOW (ref 3.5–5.0)
Alkaline Phosphatase: 83 U/L (ref 38–126)
Anion gap: 12 (ref 5–15)
BUN: 13 mg/dL (ref 6–20)
CO2: 28 mmol/L (ref 22–32)
Calcium: 9 mg/dL (ref 8.9–10.3)
Chloride: 97 mmol/L — ABNORMAL LOW (ref 98–111)
Creatinine, Ser: 1.63 mg/dL — ABNORMAL HIGH (ref 0.61–1.24)
GFR, Estimated: 52 mL/min — ABNORMAL LOW (ref >60)
Glucose, Bld: 475 mg/dL — ABNORMAL HIGH (ref 70–99)
Potassium: 4.6 mmol/L (ref 3.5–5.1)
Sodium: 137 mmol/L (ref 135–145)
Total Bilirubin: 0.8 mg/dL (ref 0.3–1.2)
Total Protein: 6.7 g/dL (ref 6.5–8.1)

## 2021-07-17 LAB — CREATININE, SERUM
Creatinine, Ser: 1.7 mg/dL — ABNORMAL HIGH (ref 0.61–1.24)
GFR, Estimated: 49 mL/min — ABNORMAL LOW (ref >60)

## 2021-07-17 LAB — CBC
HCT: 39.5 % (ref 39.0–52.0)
Hemoglobin: 12.5 g/dL — ABNORMAL LOW (ref 13.0–17.0)
MCH: 26.7 pg (ref 26.0–34.0)
MCHC: 31.6 g/dL (ref 30.0–36.0)
MCV: 84.2 fL (ref 80.0–100.0)
Platelets: 299 10*3/uL (ref 150–400)
RBC: 4.69 MIL/uL (ref 4.22–5.81)
RDW: 14.9 % (ref 11.5–15.5)
WBC: 7.5 10*3/uL (ref 4.0–10.5)
nRBC: 0 % (ref 0.0–0.2)

## 2021-07-17 LAB — RESP PANEL BY RT-PCR (FLU A&B, COVID) ARPGX2
Influenza A by PCR: NEGATIVE
Influenza B by PCR: NEGATIVE
SARS Coronavirus 2 by RT PCR: NEGATIVE

## 2021-07-17 LAB — CBG MONITORING, ED
Glucose-Capillary: 307 mg/dL — ABNORMAL HIGH (ref 70–99)
Glucose-Capillary: 397 mg/dL — ABNORMAL HIGH (ref 70–99)
Glucose-Capillary: 467 mg/dL — ABNORMAL HIGH (ref 70–99)

## 2021-07-17 LAB — PHOSPHORUS: Phosphorus: 4.4 mg/dL (ref 2.5–4.6)

## 2021-07-17 LAB — APTT: aPTT: 29 seconds (ref 24–36)

## 2021-07-17 LAB — MAGNESIUM: Magnesium: 1.6 mg/dL — ABNORMAL LOW (ref 1.7–2.4)

## 2021-07-17 LAB — PROTIME-INR
INR: 1.1 (ref 0.8–1.2)
Prothrombin Time: 13.7 seconds (ref 11.4–15.2)

## 2021-07-17 LAB — TROPONIN I (HIGH SENSITIVITY): Troponin I (High Sensitivity): 15 ng/L (ref <18)

## 2021-07-17 MED ORDER — CLOPIDOGREL BISULFATE 75 MG PO TABS
75.0000 mg | ORAL_TABLET | Freq: Every day | ORAL | Status: DC
  Filled 2021-07-17: qty 1

## 2021-07-17 MED ORDER — FLUTICASONE PROPIONATE 50 MCG/ACT NA SUSP
1.0000 | Freq: Every day | NASAL | Status: DC
  Administered 2021-07-17: 1 via NASAL
  Filled 2021-07-17: qty 16

## 2021-07-17 MED ORDER — ASPIRIN 81 MG PO CHEW
324.0000 mg | CHEWABLE_TABLET | Freq: Once | ORAL | Status: AC
  Administered 2021-07-17: 324 mg via ORAL
  Filled 2021-07-17: qty 4

## 2021-07-17 MED ORDER — INSULIN DETEMIR 100 UNIT/ML ~~LOC~~ SOLN
30.0000 [IU] | Freq: Two times a day (BID) | SUBCUTANEOUS | Status: DC
  Administered 2021-07-17: 30 [IU] via SUBCUTANEOUS
  Filled 2021-07-17 (×7): qty 0.3

## 2021-07-17 MED ORDER — MAGNESIUM SULFATE 2 GM/50ML IV SOLN
2.0000 g | Freq: Once | INTRAVENOUS | Status: AC
  Administered 2021-07-17: 2 g via INTRAVENOUS
  Filled 2021-07-17: qty 50

## 2021-07-17 MED ORDER — NITROGLYCERIN 0.4 MG SL SUBL
0.4000 mg | SUBLINGUAL_TABLET | SUBLINGUAL | Status: DC | PRN
Start: 2021-07-17 — End: 2021-07-17
  Administered 2021-07-17: 0.4 mg via SUBLINGUAL
  Filled 2021-07-17: qty 1

## 2021-07-17 MED ORDER — ENOXAPARIN SODIUM 80 MG/0.8ML IJ SOSY
80.0000 mg | PREFILLED_SYRINGE | INTRAMUSCULAR | Status: DC
  Administered 2021-07-17: 80 mg via SUBCUTANEOUS
  Filled 2021-07-17: qty 0.8

## 2021-07-17 MED ORDER — SODIUM CHLORIDE 0.9 % IV BOLUS
1000.0000 mL | Freq: Once | INTRAVENOUS | Status: AC
  Administered 2021-07-17: 1000 mL via INTRAVENOUS

## 2021-07-17 MED ORDER — LIRAGLUTIDE 18 MG/3ML ~~LOC~~ SOPN: 1.8000 mg | PEN_INJECTOR | Freq: Every day | SUBCUTANEOUS | 0 refills | Status: DC

## 2021-07-17 MED ORDER — INSULIN DETEMIR 100 UNIT/ML ~~LOC~~ SOLN
10.0000 [IU] | Freq: Two times a day (BID) | SUBCUTANEOUS | Status: DC
  Filled 2021-07-17: qty 0.1

## 2021-07-17 MED ORDER — ALPRAZOLAM 0.5 MG PO TABS
1.0000 mg | ORAL_TABLET | Freq: Every day | ORAL | Status: DC
  Administered 2021-07-17: 1 mg via ORAL
  Filled 2021-07-17: qty 2

## 2021-07-17 MED ORDER — ASPIRIN EC 81 MG PO TBEC
81.0000 mg | DELAYED_RELEASE_TABLET | Freq: Every day | ORAL | Status: DC
  Administered 2021-07-17: 81 mg via ORAL
  Filled 2021-07-17: qty 1

## 2021-07-17 MED ORDER — INSULIN ASPART 100 UNIT/ML IJ SOLN
0.0000 [IU] | Freq: Every day | INTRAMUSCULAR | Status: DC
Start: 2021-07-17 — End: 2021-07-17

## 2021-07-17 MED ORDER — SODIUM CHLORIDE 0.9 % IV SOLN: INTRAVENOUS | Status: DC

## 2021-07-17 MED ORDER — ESCITALOPRAM OXALATE 10 MG PO TABS
10.0000 mg | ORAL_TABLET | Freq: Every morning | ORAL | Status: DC
  Administered 2021-07-17: 10 mg via ORAL
  Filled 2021-07-17: qty 1

## 2021-07-17 MED ORDER — FLUTICASONE PROPIONATE (INHAL) 250 MCG/BLIST IN AEPB: 2.0000 | INHALATION_SPRAY | Freq: Two times a day (BID) | RESPIRATORY_TRACT | Status: DC

## 2021-07-17 MED ORDER — ALBUTEROL SULFATE (2.5 MG/3ML) 0.083% IN NEBU: 2.5000 mg | INHALATION_SOLUTION | Freq: Four times a day (QID) | RESPIRATORY_TRACT | Status: DC | PRN

## 2021-07-17 MED ORDER — ATORVASTATIN CALCIUM 40 MG PO TABS: 80.0000 mg | ORAL_TABLET | Freq: Every day | ORAL | Status: DC

## 2021-07-17 MED ORDER — POTASSIUM CHLORIDE CRYS ER 20 MEQ PO TBCR
40.0000 meq | EXTENDED_RELEASE_TABLET | Freq: Once | ORAL | Status: AC
  Administered 2021-07-17: 40 meq via ORAL
  Filled 2021-07-17: qty 2

## 2021-07-17 MED ORDER — INSULIN ASPART 100 UNIT/ML IJ SOLN
0.0000 [IU] | INTRAMUSCULAR | Status: DC
  Administered 2021-07-17: 20 [IU] via SUBCUTANEOUS
  Administered 2021-07-17: 15 [IU] via SUBCUTANEOUS
  Filled 2021-07-17 (×2): qty 1

## 2021-07-17 MED ORDER — ALBUTEROL SULFATE HFA 108 (90 BASE) MCG/ACT IN AERS: 2.0000 | INHALATION_SPRAY | Freq: Four times a day (QID) | RESPIRATORY_TRACT | Status: DC | PRN

## 2021-07-17 MED ORDER — PANTOPRAZOLE SODIUM 40 MG PO TBEC
40.0000 mg | DELAYED_RELEASE_TABLET | Freq: Every day | ORAL | Status: DC
  Administered 2021-07-17: 40 mg via ORAL
  Filled 2021-07-17: qty 1

## 2021-07-17 MED ORDER — CARVEDILOL 3.125 MG PO TABS
3.1250 mg | ORAL_TABLET | Freq: Two times a day (BID) | ORAL | Status: DC
  Administered 2021-07-17: 3.125 mg via ORAL
  Filled 2021-07-17: qty 1

## 2021-07-17 MED ORDER — SODIUM CHLORIDE 0.9 % IV SOLN: Freq: Once | INTRAVENOUS | Status: AC

## 2021-07-17 MED ORDER — INSULIN ASPART 100 UNIT/ML IJ SOLN
12.0000 [IU] | Freq: Three times a day (TID) | INTRAMUSCULAR | Status: DC
  Administered 2021-07-17: 12 [IU] via SUBCUTANEOUS

## 2021-07-17 MED ORDER — ALPRAZOLAM 0.5 MG PO TABS: 1.0000 mg | ORAL_TABLET | ORAL | Status: DC

## 2021-07-17 MED ORDER — ALPRAZOLAM 0.5 MG PO TABS: 2.0000 mg | ORAL_TABLET | Freq: Every day | ORAL | Status: DC

## 2021-07-17 MED ORDER — INSULIN ASPART 100 UNIT/ML IJ SOLN: 0.0000 [IU] | Freq: Three times a day (TID) | INTRAMUSCULAR | Status: DC

## 2021-07-17 NOTE — ED Provider Notes (Signed)
Discussed with Dr. Thomes Dinning for admission.   Zadie Rhine, MD 07/17/21 [phone]

## 2021-07-17 NOTE — Discharge Summary (Signed)
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 COVID-19 declaration under Section 564(b)(1) of the Act, 21 U.S.C. section 360bbb-3(b)(1), unless the authorization is terminated or revoked.  Performed at [facility], [address]  [place], [place] [address]      Time coordinating discharge: 35 minutes  SIGNED:   Rodena Goldmann, DO [facility] 07/17/2021, 1:52 PM  If 7PM-7AM, please contact night-coverage [email or web address]

## 2021-07-17 NOTE — Discharge Instructions (Addendum)
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
## 2021-07-17 NOTE — ED Provider Notes (Signed)
Patient reports he does not feel comfortable going home as he has persistent chest pain he does not "feel good" He reports headache as well. Will call for admission   Zadie Rhine, MD 07/17/21 [phone]

## 2021-07-17 NOTE — ED Provider Notes (Signed)
I assumed care in signout to follow-up on labs.  Repeat troponin unchanged.  Glucose is improving  patient stable in the ED.  I have reviewed EKG.  He will be discharged home   Zadie Rhine, MD 07/17/21 0157

## 2021-08-15 ENCOUNTER — Emergency Department (HOSPITAL_COMMUNITY): Payer: Medicare HMO

## 2021-08-15 ENCOUNTER — Encounter (HOSPITAL_COMMUNITY): Payer: Self-pay | Admitting: Emergency Medicine

## 2021-08-15 ENCOUNTER — Other Ambulatory Visit: Payer: Self-pay

## 2021-08-15 ENCOUNTER — Inpatient Hospital Stay (HOSPITAL_COMMUNITY)
Admission: EM | Admit: 2021-08-15 | Discharge: 2021-08-20 | DRG: 287 | Disposition: A | Discharge status: Home / Self Care | Payer: Medicare HMO | Attending: Internal Medicine | Admitting: Internal Medicine

## 2021-08-15 ENCOUNTER — Telehealth: Payer: Self-pay | Admitting: Physician Assistant

## 2021-08-15 DIAGNOSIS — Z794 Long term (current) use of insulin: Secondary | ICD-10-CM | POA: Diagnosis not present

## 2021-08-15 DIAGNOSIS — G629 Polyneuropathy, unspecified: Secondary | ICD-10-CM

## 2021-08-15 DIAGNOSIS — I255 Ischemic cardiomyopathy: Secondary | ICD-10-CM | POA: Diagnosis present

## 2021-08-15 DIAGNOSIS — Z9989 Dependence on other enabling machines and devices: Secondary | ICD-10-CM | POA: Diagnosis not present

## 2021-08-15 DIAGNOSIS — E1151 Type 2 diabetes mellitus with diabetic peripheral angiopathy without gangrene: Secondary | ICD-10-CM | POA: Diagnosis present

## 2021-08-15 DIAGNOSIS — E1165 Type 2 diabetes mellitus with hyperglycemia: Secondary | ICD-10-CM | POA: Diagnosis present

## 2021-08-15 DIAGNOSIS — Z6841 Body Mass Index (BMI) 40.0 and over, adult: Secondary | ICD-10-CM | POA: Diagnosis present

## 2021-08-15 DIAGNOSIS — Z95820 Peripheral vascular angioplasty status with implants and grafts: Secondary | ICD-10-CM

## 2021-08-15 DIAGNOSIS — Z951 Presence of aortocoronary bypass graft: Secondary | ICD-10-CM

## 2021-08-15 DIAGNOSIS — I129 Hypertensive chronic kidney disease with stage 1 through stage 4 chronic kidney disease, or unspecified chronic kidney disease: Secondary | ICD-10-CM | POA: Diagnosis present

## 2021-08-15 DIAGNOSIS — Z86711 Personal history of pulmonary embolism: Secondary | ICD-10-CM | POA: Diagnosis present

## 2021-08-15 DIAGNOSIS — R079 Chest pain, unspecified: Secondary | ICD-10-CM | POA: Diagnosis not present

## 2021-08-15 DIAGNOSIS — Z7982 Long term (current) use of aspirin: Secondary | ICD-10-CM

## 2021-08-15 DIAGNOSIS — F419 Anxiety disorder, unspecified: Secondary | ICD-10-CM | POA: Diagnosis present

## 2021-08-15 DIAGNOSIS — Z20822 Contact with and (suspected) exposure to covid-19: Secondary | ICD-10-CM | POA: Diagnosis present

## 2021-08-15 DIAGNOSIS — E1142 Type 2 diabetes mellitus with diabetic polyneuropathy: Secondary | ICD-10-CM | POA: Diagnosis present

## 2021-08-15 DIAGNOSIS — I152 Hypertension secondary to endocrine disorders: Secondary | ICD-10-CM | POA: Diagnosis not present

## 2021-08-15 DIAGNOSIS — Z833 Family history of diabetes mellitus: Secondary | ICD-10-CM

## 2021-08-15 DIAGNOSIS — N183 Chronic kidney disease, stage 3 unspecified: Secondary | ICD-10-CM | POA: Diagnosis present

## 2021-08-15 DIAGNOSIS — E11628 Type 2 diabetes mellitus with other skin complications: Secondary | ICD-10-CM

## 2021-08-15 DIAGNOSIS — Z86718 Personal history of other venous thrombosis and embolism: Secondary | ICD-10-CM

## 2021-08-15 DIAGNOSIS — I252 Old myocardial infarction: Secondary | ICD-10-CM | POA: Diagnosis not present

## 2021-08-15 DIAGNOSIS — I82409 Acute embolism and thrombosis of unspecified deep veins of unspecified lower extremity: Secondary | ICD-10-CM | POA: Diagnosis present

## 2021-08-15 DIAGNOSIS — Q231 Congenital insufficiency of aortic valve: Secondary | ICD-10-CM

## 2021-08-15 DIAGNOSIS — E11621 Type 2 diabetes mellitus with foot ulcer: Secondary | ICD-10-CM | POA: Diagnosis present

## 2021-08-15 DIAGNOSIS — E1122 Type 2 diabetes mellitus with diabetic chronic kidney disease: Secondary | ICD-10-CM | POA: Diagnosis present

## 2021-08-15 DIAGNOSIS — M159 Polyosteoarthritis, unspecified: Secondary | ICD-10-CM | POA: Diagnosis present

## 2021-08-15 DIAGNOSIS — E875 Hyperkalemia: Secondary | ICD-10-CM | POA: Diagnosis not present

## 2021-08-15 DIAGNOSIS — I2584 Coronary atherosclerosis due to calcified coronary lesion: Secondary | ICD-10-CM | POA: Diagnosis present

## 2021-08-15 DIAGNOSIS — Z7901 Long term (current) use of anticoagulants: Secondary | ICD-10-CM | POA: Diagnosis not present

## 2021-08-15 DIAGNOSIS — Z89422 Acquired absence of other left toe(s): Secondary | ICD-10-CM

## 2021-08-15 DIAGNOSIS — I739 Peripheral vascular disease, unspecified: Secondary | ICD-10-CM | POA: Diagnosis not present

## 2021-08-15 DIAGNOSIS — Z560 Unemployment, unspecified: Secondary | ICD-10-CM

## 2021-08-15 DIAGNOSIS — L97529 Non-pressure chronic ulcer of other part of left foot with unspecified severity: Secondary | ICD-10-CM | POA: Diagnosis present

## 2021-08-15 DIAGNOSIS — J449 Chronic obstructive pulmonary disease, unspecified: Secondary | ICD-10-CM | POA: Diagnosis present

## 2021-08-15 DIAGNOSIS — G4733 Obstructive sleep apnea (adult) (pediatric): Secondary | ICD-10-CM | POA: Diagnosis present

## 2021-08-15 DIAGNOSIS — E1342 Other specified diabetes mellitus with diabetic polyneuropathy: Secondary | ICD-10-CM | POA: Diagnosis not present

## 2021-08-15 DIAGNOSIS — I70202 Unspecified atherosclerosis of native arteries of extremities, left leg: Secondary | ICD-10-CM | POA: Diagnosis present

## 2021-08-15 DIAGNOSIS — Z955 Presence of coronary angioplasty implant and graft: Secondary | ICD-10-CM

## 2021-08-15 DIAGNOSIS — I200 Unstable angina: Secondary | ICD-10-CM | POA: Diagnosis present

## 2021-08-15 DIAGNOSIS — Z89421 Acquired absence of other right toe(s): Secondary | ICD-10-CM

## 2021-08-15 DIAGNOSIS — E1159 Type 2 diabetes mellitus with other circulatory complications: Secondary | ICD-10-CM | POA: Diagnosis not present

## 2021-08-15 DIAGNOSIS — I25110 Atherosclerotic heart disease of native coronary artery with unstable angina pectoris: Secondary | ICD-10-CM | POA: Diagnosis present

## 2021-08-15 DIAGNOSIS — Z7902 Long term (current) use of antithrombotics/antiplatelets: Secondary | ICD-10-CM

## 2021-08-15 DIAGNOSIS — F17210 Nicotine dependence, cigarettes, uncomplicated: Secondary | ICD-10-CM | POA: Diagnosis present

## 2021-08-15 DIAGNOSIS — E119 Type 2 diabetes mellitus without complications: Secondary | ICD-10-CM

## 2021-08-15 DIAGNOSIS — Z8249 Family history of ischemic heart disease and other diseases of the circulatory system: Secondary | ICD-10-CM

## 2021-08-15 DIAGNOSIS — Z79899 Other long term (current) drug therapy: Secondary | ICD-10-CM

## 2021-08-15 DIAGNOSIS — I712 Thoracic aortic aneurysm, without rupture: Secondary | ICD-10-CM | POA: Diagnosis not present

## 2021-08-15 DIAGNOSIS — F17200 Nicotine dependence, unspecified, uncomplicated: Secondary | ICD-10-CM | POA: Diagnosis present

## 2021-08-15 DIAGNOSIS — I2510 Atherosclerotic heart disease of native coronary artery without angina pectoris: Secondary | ICD-10-CM | POA: Diagnosis not present

## 2021-08-15 DIAGNOSIS — R072 Precordial pain: Secondary | ICD-10-CM

## 2021-08-15 DIAGNOSIS — E785 Hyperlipidemia, unspecified: Secondary | ICD-10-CM | POA: Diagnosis present

## 2021-08-15 DIAGNOSIS — Z882 Allergy status to sulfonamides status: Secondary | ICD-10-CM

## 2021-08-15 LAB — CBC
HCT: 40.1 % (ref 39.0–52.0)
Hemoglobin: 12.6 g/dL — ABNORMAL LOW (ref 13.0–17.0)
MCH: 26.4 pg (ref 26.0–34.0)
MCHC: 31.4 g/dL (ref 30.0–36.0)
MCV: 83.9 fL (ref 80.0–100.0)
Platelets: 309 10*3/uL (ref 150–400)
RBC: 4.78 MIL/uL (ref 4.22–5.81)
RDW: 14.2 % (ref 11.5–15.5)
WBC: 8.7 10*3/uL (ref 4.0–10.5)
nRBC: 0 % (ref 0.0–0.2)

## 2021-08-15 LAB — PROTIME-INR
INR: 0.9 (ref 0.8–1.2)
Prothrombin Time: 12.5 seconds (ref 11.4–15.2)

## 2021-08-15 LAB — BASIC METABOLIC PANEL
Anion gap: 9 (ref 5–15)
BUN: 20 mg/dL (ref 6–20)
CO2: 23 mmol/L (ref 22–32)
Calcium: 9.1 mg/dL (ref 8.9–10.3)
Chloride: 102 mmol/L (ref 98–111)
Creatinine, Ser: 1.19 mg/dL (ref 0.61–1.24)
GFR, Estimated: >60 mL/min (ref >60)
Glucose, Bld: 410 mg/dL — ABNORMAL HIGH (ref 70–99)
Potassium: 4 mmol/L (ref 3.5–5.1)
Sodium: 134 mmol/L — ABNORMAL LOW (ref 135–145)

## 2021-08-15 LAB — GLUCOSE, CAPILLARY: Glucose-Capillary: 324 mg/dL — ABNORMAL HIGH (ref 70–99)

## 2021-08-15 LAB — TROPONIN I (HIGH SENSITIVITY)
Troponin I (High Sensitivity): 23 ng/L — ABNORMAL HIGH (ref <18)
Troponin I (High Sensitivity): 18 ng/L — ABNORMAL HIGH (ref <18)
Troponin I (High Sensitivity): 25 ng/L — ABNORMAL HIGH (ref <18)
Troponin I (High Sensitivity): 20 ng/L — ABNORMAL HIGH (ref <18)

## 2021-08-15 LAB — HEMOGLOBIN A1C
Hgb A1c MFr Bld: 11.8 % — ABNORMAL HIGH (ref 4.8–5.6)
Mean Plasma Glucose: 291.96 mg/dL

## 2021-08-15 LAB — CBG MONITORING, ED: Glucose-Capillary: 356 mg/dL — ABNORMAL HIGH (ref 70–99)

## 2021-08-15 LAB — RESP PANEL BY RT-PCR (FLU A&B, COVID) ARPGX2
Influenza A by PCR: NEGATIVE
Influenza B by PCR: NEGATIVE
SARS Coronavirus 2 by RT PCR: NEGATIVE

## 2021-08-15 MED ORDER — ALPRAZOLAM 0.5 MG PO TABS
1.0000 mg | ORAL_TABLET | Freq: Every day | ORAL | Status: DC
  Administered 2021-08-15 – 2021-08-19 (×5): 1 mg via ORAL
  Filled 2021-08-15 (×5): qty 2

## 2021-08-15 MED ORDER — PREGABALIN 25 MG PO CAPS: 150.0000 mg | ORAL_CAPSULE | Freq: Three times a day (TID) | ORAL | Status: DC

## 2021-08-15 MED ORDER — ASPIRIN EC 81 MG PO TBEC
81.0000 mg | DELAYED_RELEASE_TABLET | Freq: Every day | ORAL | Status: DC
  Administered 2021-08-16 – 2021-08-20 (×5): 81 mg via ORAL
  Filled 2021-08-15 (×5): qty 1

## 2021-08-15 MED ORDER — ESCITALOPRAM OXALATE 10 MG PO TABS
10.0000 mg | ORAL_TABLET | Freq: Every morning | ORAL | Status: DC
  Administered 2021-08-16 – 2021-08-20 (×4): 10 mg via ORAL
  Filled 2021-08-15 (×4): qty 1

## 2021-08-15 MED ORDER — BUPROPION HCL ER (SR) 150 MG PO TB12
150.0000 mg | ORAL_TABLET | Freq: Two times a day (BID) | ORAL | Status: DC
  Administered 2021-08-15 – 2021-08-20 (×10): 150 mg via ORAL
  Filled 2021-08-15 (×11): qty 1

## 2021-08-15 MED ORDER — INSULIN ASPART 100 UNIT/ML IJ SOLN
0.0000 [IU] | Freq: Three times a day (TID) | INTRAMUSCULAR | Status: DC
Start: 2021-08-16 — End: 2021-08-15

## 2021-08-15 MED ORDER — CARVEDILOL 3.125 MG PO TABS
3.1250 mg | ORAL_TABLET | Freq: Two times a day (BID) | ORAL | Status: DC
  Administered 2021-08-15 – 2021-08-17 (×4): 3.125 mg via ORAL
  Filled 2021-08-15 (×4): qty 1

## 2021-08-15 MED ORDER — FENOFIBRATE 160 MG PO TABS
160.0000 mg | ORAL_TABLET | Freq: Every day | ORAL | Status: DC
  Administered 2021-08-16 – 2021-08-20 (×5): 160 mg via ORAL
  Filled 2021-08-15 (×5): qty 1

## 2021-08-15 MED ORDER — ASPIRIN 81 MG PO CHEW: 324.0000 mg | CHEWABLE_TABLET | Freq: Once | ORAL | Status: DC

## 2021-08-15 MED ORDER — PREGABALIN 100 MG PO CAPS
300.0000 mg | ORAL_CAPSULE | Freq: Every day | ORAL | Status: DC
  Administered 2021-08-16 – 2021-08-20 (×5): 300 mg via ORAL
  Filled 2021-08-15 (×5): qty 3

## 2021-08-15 MED ORDER — PANTOPRAZOLE SODIUM 40 MG PO TBEC
40.0000 mg | DELAYED_RELEASE_TABLET | Freq: Every day | ORAL | Status: DC
  Administered 2021-08-15 – 2021-08-20 (×6): 40 mg via ORAL
  Filled 2021-08-15 (×6): qty 1

## 2021-08-15 MED ORDER — ALBUTEROL SULFATE HFA 108 (90 BASE) MCG/ACT IN AERS
2.0000 | INHALATION_SPRAY | Freq: Four times a day (QID) | RESPIRATORY_TRACT | Status: DC | PRN
  Filled 2021-08-15: qty 6.7

## 2021-08-15 MED ORDER — INSULIN ASPART 100 UNIT/ML IJ SOLN
6.0000 [IU] | Freq: Three times a day (TID) | INTRAMUSCULAR | Status: DC
  Administered 2021-08-16 – 2021-08-17 (×5): 6 [IU] via SUBCUTANEOUS

## 2021-08-15 MED ORDER — INSULIN GLARGINE-YFGN 100 UNIT/ML ~~LOC~~ SOLN
60.0000 [IU] | Freq: Two times a day (BID) | SUBCUTANEOUS | Status: DC
  Administered 2021-08-15 – 2021-08-18 (×5): 60 [IU] via SUBCUTANEOUS
  Filled 2021-08-15 (×8): qty 0.6

## 2021-08-15 MED ORDER — NITROGLYCERIN 0.4 MG SL SUBL: 0.4000 mg | SUBLINGUAL_TABLET | SUBLINGUAL | Status: DC | PRN

## 2021-08-15 MED ORDER — PREGABALIN 25 MG PO CAPS
150.0000 mg | ORAL_CAPSULE | Freq: Three times a day (TID) | ORAL | Status: DC
  Administered 2021-08-15: 150 mg via ORAL
  Filled 2021-08-15: qty 1

## 2021-08-15 MED ORDER — ACETAMINOPHEN 325 MG PO TABS: 650.0000 mg | ORAL_TABLET | ORAL | Status: DC | PRN

## 2021-08-15 MED ORDER — INSULIN DETEMIR 100 UNIT/ML ~~LOC~~ SOLN: 20.0000 [IU] | Freq: Two times a day (BID) | SUBCUTANEOUS | Status: DC

## 2021-08-15 MED ORDER — PREGABALIN 75 MG PO CAPS
150.0000 mg | ORAL_CAPSULE | Freq: Every day | ORAL | Status: DC
  Administered 2021-08-15 – 2021-08-19 (×5): 150 mg via ORAL
  Filled 2021-08-15 (×5): qty 2

## 2021-08-15 MED ORDER — INSULIN ASPART 100 UNIT/ML IJ SOLN
0.0000 [IU] | Freq: Every day | INTRAMUSCULAR | Status: DC
  Administered 2021-08-15: 4 [IU] via SUBCUTANEOUS
  Administered 2021-08-16: 2 [IU] via SUBCUTANEOUS
  Administered 2021-08-17 – 2021-08-18 (×2): 3 [IU] via SUBCUTANEOUS

## 2021-08-15 MED ORDER — OXYCODONE HCL 5 MG PO TABS
15.0000 mg | ORAL_TABLET | Freq: Once | ORAL | Status: AC
  Administered 2021-08-15: 15 mg via ORAL
  Filled 2021-08-15: qty 3

## 2021-08-15 MED ORDER — HEPARIN BOLUS VIA INFUSION
4000.0000 [IU] | Freq: Once | INTRAVENOUS | Status: AC
  Administered 2021-08-15: 4000 [IU] via INTRAVENOUS
  Filled 2021-08-15: qty 4000

## 2021-08-15 MED ORDER — INSULIN ASPART 100 UNIT/ML IJ SOLN
0.0000 [IU] | Freq: Three times a day (TID) | INTRAMUSCULAR | Status: DC
  Administered 2021-08-16 (×2): 8 [IU] via SUBCUTANEOUS
  Administered 2021-08-16: 5 [IU] via SUBCUTANEOUS
  Administered 2021-08-17 (×2): 8 [IU] via SUBCUTANEOUS
  Administered 2021-08-17: 5 [IU] via SUBCUTANEOUS
  Administered 2021-08-18: 8 [IU] via SUBCUTANEOUS
  Administered 2021-08-18: 3 [IU] via SUBCUTANEOUS
  Administered 2021-08-18: 5 [IU] via SUBCUTANEOUS
  Administered 2021-08-19: 3 [IU] via SUBCUTANEOUS
  Administered 2021-08-19: 5 [IU] via SUBCUTANEOUS

## 2021-08-15 MED ORDER — ONDANSETRON HCL 4 MG/2ML IJ SOLN: 4.0000 mg | Freq: Four times a day (QID) | INTRAMUSCULAR | Status: DC | PRN

## 2021-08-15 MED ORDER — CLOPIDOGREL BISULFATE 75 MG PO TABS
75.0000 mg | ORAL_TABLET | Freq: Every day | ORAL | Status: DC
  Administered 2021-08-16 – 2021-08-20 (×5): 75 mg via ORAL
  Filled 2021-08-15 (×5): qty 1

## 2021-08-15 MED ORDER — ATORVASTATIN CALCIUM 80 MG PO TABS
80.0000 mg | ORAL_TABLET | Freq: Every day | ORAL | Status: DC
  Administered 2021-08-15: 80 mg via ORAL
  Filled 2021-08-15: qty 2

## 2021-08-15 MED ORDER — HEPARIN (PORCINE) 25000 UT/250ML-% IV SOLN
2300.0000 [IU]/h | INTRAVENOUS | Status: DC
  Administered 2021-08-15: 1900 [IU]/h via INTRAVENOUS
  Filled 2021-08-15 (×2): qty 250

## 2021-08-15 MED ORDER — NITROGLYCERIN IN D5W 200-5 MCG/ML-% IV SOLN
0.0000 ug/min | INTRAVENOUS | Status: DC
  Administered 2021-08-15: 5 ug/min via INTRAVENOUS
  Filled 2021-08-15: qty 250

## 2021-08-15 NOTE — Progress Notes (Addendum)
Inpatient Diabetes Program Recommendations  AACE/ADA: New Consensus Statement on Inpatient Glycemic Control (2015)  Target Ranges:  Prepandial:   less than 140 mg/dL      Peak postprandial:   less than 180 mg/dL (1-2 hours)      Critically ill patients:  140 - 180 mg/dL   Lab Results  Component Value Date   GLUCAP 356 (H) 08/15/2021   HGBA1C 13.7 (H) 07/10/2021    Review of Glycemic Control  Diabetes history: DM 2 Outpatient Diabetes medications: Metformin 1000 mg bid, Victoza 1.8 mg Daily, Novolog 12 units tid, Levemir 60 units bid Current orders for Inpatient glycemic control:  Novolog 0-15 units tid  Inpatient Diabetes Program Recommendations:    - Levemir 20 units bid - Novolog 0-15 units tid + hs - Novolog 6 units tid meal coverage if eating >50% of meals, hold if NPO or glucose less than 80.  A1c 13.7 on 7/22, pt seen by DM coordinator on 7/22. Insurance has stopped paying for humalog and was getting the Lantus from a friend. We verified that insurance covered Novolog and Levemir and ordered those during last admission in July  Spoke with [place], [place] regarding insulin regimen. Orders placed for cosignature.  Thanks,  Christena Deem RN, MSN, BC-ADM Inpatient Diabetes Coordinator Team Pager [phone] (8a-5p)

## 2021-08-15 NOTE — H&P (Signed)
Cardiology Admission History and Physical:   Patient ID: Joseph [name] MRN: [id number]; DOB: 05-19-1971   Admission date: 08/15/2021  PCP:  Marva Panda, NP   [facility] Providers Cardiologist:  Chilton Si, MD        Chief Complaint:  angina  Patient Profile:   Joseph [name] is a 50 y.o. male with CAD s/p dLCx and s/p CABG (LIMA-->LAD, SVG-->PDA, OM1, D1),  PAD with stents to the R extremity with prior R and L toe amputations, chronic ulcer of both feet  great toe;  COPD with significant prior smoking history, HTN and HLD, Ischemic cardiomyopathy f unclear status Diabetes with diabetes neuropathy, prior hx of Morbid Obesity, OSA unclear if on CPAP DVT and PE on xarelto who presented wit angina.  History of Present Illness:   Joseph [name] notes that he is feeling has been feeling terrible since Midnight.  Has squeezing chest pressure that radiates into her right arm- similar to his angina before CABG.  Discomfort occurs with at rest and was notable watching TV.  For the past two weeks, he has noted shortness of breath with mild exertion.  Notes weight loss, no swelling in his legs or abdomen, but notes PND.  Patient exertion has been minimal for some time.  No syncope or near syncope. Notes  no palpitations or funny heart beats.   No leg claudication (notes that he has minimal feeling in his legs secondary to his neuropathy).  In the ED, first troponin is 23.  ED ordered ASA 325 mg. Cardiology called for evaluation.   Past Medical History:  Diagnosis Date   Anxiety    Arthritis    "knees, shoulders, hips, ankles" (07/29/2016)   Asthma    CAD (coronary artery disease)    a. 2017: s/p BMS to distal Cx; b. LHC 05/30/2019: 80% mid, distal RCA s/p DES, 30% narrowing of d LM, widely patent LAD w/ luminal irregularities, widely patent stent in dCX  w/ 90+% stenosis distal to stent beofre small trifurcating obtuse marginal (potentially area of restenosis)   Chronic bronchitis  (HCC)    Chronic ulcer of right great toe (HCC) 09/19/2017   COPD (chronic obstructive pulmonary disease) (HCC)    Depression    DVT (deep venous thrombosis) (HCC)    GERD (gastroesophageal reflux disease)    Hyperlipidemia    Hypertension    Myocardial infarction (HCC) 1996   "light one"   PE (pulmonary embolism) 04/2013   On chronic Xarelto   Peripheral nerve disease    Pneumonia "several times"   Sleep apnea    Type II diabetes mellitus (HCC)     Past Surgical History:  Procedure Laterality Date   ABDOMINAL AORTOGRAM W/LOWER EXTREMITY N/A 10/10/2020   Procedure: ABDOMINAL AORTOGRAM W/LOWER EXTREMITY;  Surgeon: Sherren Kerns, MD;  [facility]: [facility];  Service: Cardiovascular;  Laterality: N/A;   AMPUTATION Right 09/21/2017   Procedure: RIGHT GREAT TOE AMPUTATION, POSSIBLE VAC;  Surgeon: Tarry Kos, MD;  Location: [facility] OR;  Service: Orthopedics;  Laterality: Right;   AMPUTATION Left 08/13/2020   Procedure: LEFT FOOT 4TH RAY AMPUTATION;  Surgeon: [name], MD;  Location: [facility] OR;  Service: Orthopedics;  Laterality: Left;   AORTOGRAM Bilateral 03/13/2021   Procedure: ABDOMINAL AORTOGRAM WITH Left LOWER EXTREMITY RUNOFF;  Surgeon: Leonie Douglas, MD;  Location: [facility] OR;  Service: Vascular;  Laterality: Bilateral;   CARDIAC CATHETERIZATION  2006   CARDIAC CATHETERIZATION  1996   "@ [facility];  when I had my heart attack"   CARDIAC CATHETERIZATION N/A 07/29/2016   Procedure: Left Heart Cath and Coronary Angiography;  Surgeon: Runell GessJonathan J Berry, MD;  [facility];  Service: Cardiovascular;  Laterality: N/A;   CARDIAC CATHETERIZATION N/A 07/29/2016   Procedure: Coronary Stent Intervention;  Surgeon: Runell GessJonathan J Berry, MD;  [facility]: [facility];  Service: Cardiovascular;  Laterality: N/A;   CARPAL TUNNEL RELEASE Bilateral    CORONARY ANGIOPLASTY WITH STENT PLACEMENT  07/29/2016   CORONARY ARTERY BYPASS GRAFT N/A 07/24/2020   Procedure: CORONARY ARTERY BYPASS  GRAFTING (CABG), ON PUMP, TIMES FOUR, USING LEFT INTERNAL MAMMARY ARTERY AND ENDOSCOPICALLY HARVESTED RIGHT GREATER SAPHENOUS VEIN;  Surgeon: Corliss SkainsLightfoot, Harrell O, MD;  Location: [facility] OR;  Service: Open Heart Surgery;  Laterality: N/A;  FLOW TAC   CORONARY STENT INTERVENTION N/A 05/30/2019   Procedure: CORONARY STENT INTERVENTION;  Surgeon: Lyn RecordsSmith, Henry W, MD;  [facility]: [facility];  Service: Cardiovascular;  Laterality: N/A;   CORONARY STENT INTERVENTION N/A 11/02/2019   Procedure: CORONARY STENT INTERVENTION;  Surgeon: Iran OuchArida, Muhammad A, MD;  [facility];  Service: Cardiovascular;  Laterality: N/A;   ESOPHAGOGASTRODUODENOSCOPY N/A 09/22/2017   Procedure: ESOPHAGOGASTRODUODENOSCOPY (EGD);  Surgeon: Meryl DareStark, Malcolm T, MD;  [facility];  Service: Endoscopy;  Laterality: N/A;   INTRAVASCULAR PRESSURE WIRE/FFR STUDY N/A 07/22/2020   Procedure: INTRAVASCULAR PRESSURE WIRE/FFR STUDY;  Surgeon: Marykay LexHarding, David W, MD;  [facility];  Service: Cardiovascular;  Laterality: N/A;   KNEE ARTHROSCOPY Bilateral    "2 on left; 1 on the right"   LEFT HEART CATH AND CORONARY ANGIOGRAPHY N/A 11/02/2019   Procedure: LEFT HEART CATH AND CORONARY ANGIOGRAPHY;  Surgeon: Iran OuchArida, Muhammad A, MD;  [facility];  Service: Cardiovascular;  Laterality: N/A;   LEFT HEART CATH AND CORONARY ANGIOGRAPHY N/A 07/22/2020   Procedure: LEFT HEART CATH AND CORONARY ANGIOGRAPHY;  Surgeon: Marykay LexHarding, David W, MD;  [facility];  Service: Cardiovascular;  Laterality: N/A;   PERIPHERAL VASCULAR INTERVENTION Left 10/11/2020   popliteal and SFA stent placement    PERIPHERAL VASCULAR INTERVENTION Left 10/10/2020   Procedure: PERIPHERAL VASCULAR INTERVENTION;  Surgeon: Sherren KernsFields, Charles E, MD;  [facility];  Service: Cardiovascular;  Laterality: Left;   RIGHT/LEFT HEART CATH AND CORONARY ANGIOGRAPHY N/A 05/30/2019   Procedure: RIGHT/LEFT HEART CATH AND  CORONARY ANGIOGRAPHY;  Surgeon: Lyn RecordsSmith, Henry W, MD;  [facility];  Service: Cardiovascular;  Laterality: N/A;   SHOULDER OPEN ROTATOR CUFF REPAIR Bilateral    TEE WITHOUT CARDIOVERSION N/A 07/24/2020   Procedure: TRANSESOPHAGEAL ECHOCARDIOGRAM (TEE);  Surgeon: Corliss SkainsLightfoot, Harrell O, MD;  [facility];  Service: Open Heart Surgery;  Laterality: N/A;     Medications Prior to Admission: Prior to Admission medications   Medication Sig Start Date End Date Taking? Authorizing Provider  albuterol (VENTOLIN HFA) 108 (90 Base) MCG/ACT inhaler Inhale 2 puffs into the lungs every 6 (six) hours as needed for wheezing or shortness of breath.    [provider]  ALPRAZolam Prudy Feeler(XANAX) 1 MG tablet Take 1 mg by mouth See admin instructions. 1 tablet by mouth twice daily and 2 tabs at bedtime.    [provider]  aspirin EC 81 MG tablet Take 1 tablet (81 mg total) by mouth daily. Swallow whole. 03/14/21   Glenford BayleySpeakman, Rachel, MD  atorvastatin (LIPITOR) 80 MG tablet Take 1 tablet (80 mg total) by mouth daily at 6  PM. 05/31/19   Corrin Parker, PA-C  buPROPion (WELLBUTRIN SR) 150 MG 12 hr tablet Take 150 mg by mouth 2 (two) times daily.  08/17/16   [provider]  carvedilol (COREG) 3.125 MG tablet Take 3.125 mg by mouth 2 (two) times daily. 05/13/21   [provider]  clopidogrel (PLAVIX) 75 MG tablet Take 1 tablet (75 mg total) by mouth daily with breakfast. 10/11/20   Lars Mage, PA-C  escitalopram (LEXAPRO) 10 MG tablet Take 10 mg by mouth in the morning. 06/10/20   [provider]  fenofibrate (TRICOR) 145 MG tablet Take 145 mg by mouth in the morning.    [provider]  furosemide (LASIX) 40 MG tablet Take 1 tablet (40 mg total) by mouth daily. 05/13/21 08/11/21  Chilton Si, MD  insulin aspart (NOVOLOG) 100 UNIT/ML FlexPen Inject 12 Units into the skin 3 (three) times daily with meals. 07/13/21   Vassie Loll, MD  insulin detemir  (LEVEMIR) 100 UNIT/ML FlexPen Inject 35 Units into the skin 2 (two) times daily. 07/13/21   Vassie Loll, MD  liraglutide (VICTOZA) 18 MG/3ML SOPN Inject 1.8 mg into the skin daily. 07/17/21   Sherryll Burger, Pratik D, DO  meclizine (ANTIVERT) 25 MG tablet Take 25 mg by mouth 3 (three) times daily as needed for dizziness. Take 50 mg by mouth in the morning & 25 mg by mouth at night 04/10/13   [provider]  metFORMIN (GLUCOPHAGE) 1000 MG tablet Take 1 tablet (1,000 mg total) by mouth 2 (two) times daily with a meal. 11/05/19   Leone Brand, NP  midodrine (PROAMATINE) 5 MG tablet Take 5 mg by mouth 3 (three) times daily with meals.    [provider]  naloxone [facility]) nasal spray 4 mg/0.1 mL Place 4 mg into the nose as needed (overdose).    [provider]  nitroGLYCERIN (NITROSTAT) 0.4 MG SL tablet Place 0.4 mg under the tongue every 5 (five) minutes as needed for chest pain.    [provider]  oxyCODONE (ROXICODONE) 15 MG immediate release tablet Take 1 tablet (15 mg total) by mouth 5 (five) times daily. 03/14/21   Glenford Bayley, MD  pantoprazole (PROTONIX) 40 MG tablet Take 1 tablet (40 mg total) by mouth daily. 09/23/17   Hongalgi, Maximino Greenland, MD  pregabalin (LYRICA) 150 MG capsule Take 150 mg by mouth 3 (three) times daily. 05/06/19   [provider]  promethazine (PHENERGAN) 25 MG tablet Take 25 mg by mouth every 4 (four) hours as needed for nausea or vomiting.    [provider]     Allergies:    Allergies  Allergen Reactions   Sulfa Antibiotics Other (See Comments)    Headaches     Social History:   Social History   Socioeconomic History   Marital status: Married    Spouse name: Not on file   Number of children: Not on file   Years of education: Not on file   Highest education level: Not on file  Occupational History   Occupation: disabled     Employer: UNEMPLOYED  Tobacco Use   Smoking status: Former    Packs/day: 1.00     Years: 34.00    Pack years: 34.00    Types: Cigarettes    Quit date: 07/12/2020    Years since quitting: 1.0   Smokeless tobacco: Former    Types: Snuff, Chew  Vaping Use   Vaping Use: Never used  Substance and Sexual  Activity   Alcohol use: Yes    Comment: RARE   Drug use: No   Sexual activity: Yes  Other Topics Concern   Not on file  Social History Narrative   Not on file   Social Determinants of Health   Financial Resource Strain: Not on file  Food Insecurity: Not on file  Transportation Needs: Not on file  Physical Activity: Not on file  Stress: Not on file  Social Connections: Not on file  Intimate Partner Violence: Not on file    Family History:   The patient's family history includes Diabetes in an other family member; Hyperlipidemia in an other family member; Hypertension in his brother and father.    ROS:  Please see the history of present illness.  All other ROS reviewed and negative.     Physical Exam/Data:   Vitals:   08/15/21 1359 08/15/21 1505 08/15/21 1511 08/15/21 1600  BP: (!) 165/91 129/88  (!) 150/95  Pulse: 91 84  82  Resp: 16 19  18   Temp: 98.3 F (36.8 C)     TempSrc:      SpO2: 98% 93%  96%  Weight:   (!) 156.5 kg   Height:   6\' 5"  (1.956 m)    No intake or output data in the 24 hours ending 08/15/21 1727 Last 3 Weights 08/15/2021 07/16/2021 07/12/2021  Weight (lbs) 345 lb 373 lb 9.6 oz 373 lb 9.6 oz  Weight (kg) 156.491 kg 169.464 kg 169.464 kg     Body mass index is 40.91 kg/m.  General:  Obese male in mild acute distress HEENT: Prominent L sided Frank's Sign Lymph: no adenopathy Neck: no JVD Endocrine:  No thryomegaly Vascular: No carotid bruits; FA pulses 2+ bilaterally without bruits  Cardiac:  normal S1, S2; RRR; no murmur Distant heart sounds Lungs:  good air movement with expiratory rhonchi Abd: soft, nontender, no hepatomegaly  Ext: no edema; well healed ulcers and amputation sites no purulence Musculoskeletal:   BUE and  BLE strength normal and equal Skin: warm and dry  Neuro:  CNs 2-12 intact, no focal abnormalities noted Psych:  Normal affect    EKG:  The ECG that was done  was personally reviewed and demonstrates Sinus tachycardia inferolateral infarct pattern iRBBB  Relevant CV Studies:   Transthoracic Echocardiogram: Date: 01/13/21 Results:   1. Images are very limited.   2. Left ventricular ejection fraction, by estimation, is approximately 50  to 55%. The left ventricle has low normal function. Left ventricular  endocardial border not optimally defined to evaluate regional wall motion.  There is mild left ventricular  hypertrophy. Left ventricular diastolic parameters were normal. Definity  contrast study could be considered.   3. Right ventricular systolic function is low normal. The right  ventricular size is normal. Tricuspid regurgitation signal is inadequate  for assessing PA pressure.   4. The mitral valve is grossly normal. No evidence of mitral valve  regurgitation.   5. The aortic valve was not well visualized, mild annular calcification.  Aortic valve regurgitation is not visualized.   Comparison(s): TEE done 07/24/20 showed an EF of 50-55%.   NM Stress Testing : Date: 05/23/2019 Results:   The left ventricular ejection fraction is moderately decreased (30-44%). Nuclear stress EF: 44%. Mild diffuse hypokinesis. Left ventricular hypertrophy pattern noted. This is a intermediate risk study based upon reduced EF. No ischemia. No evidence of circumflex ischemia (prior PCI).  Left/Right Heart Catheterizations: Date: 07/22/20 Results: \SUMMARY Severe distal LEFT  MAIN 70% stenosis (highly positive DFR 0.85 and proximal LAD) Relatively normal LAD with minimal disease, 1st & 3rd Diag having proximal 50 to 60% stenosis. Diffuse 55% calcified proximal to mid LCx with a focal 60% stenosis just prior to overlapping VISION BMS, and RESOLUTE ONYX DES in the main LCx prior to  bifurcation. Minimal RCA disease with roughly 20% ISR in the stented segment of the RCA.  Ostial PDA 50-60% LV gram not performed due to severely elevated LVEDP of 33 mmHg -> consistent with ACUTE DIASTOLIC HEART FAILURE (will need echocardiogram to assess EF)     RECOMMENDATIONS: With severely LM and proximal LCx disease, will refer for CVTS consultation. -->  Plavix has been discontinued He would likely need several days for Plavix washout -> he will also need aggressive blood pressure management along with diuresis. I have ordered Lasix 40 mg IV twice daily starting this evening. Restart IV heparin 8 hours after sheath removal. Continue aggressive CRF Guideline Directed Therapy.   Laboratory Data:  High Sensitivity Troponin:   Recent Labs  Lab 07/16/21 2155 07/16/21 2358 08/15/21 1235 08/15/21 1520  TROPONINIHS 15 15 23* 18*      Chemistry Recent Labs  Lab 08/15/21 1235  NA 134*  K 4.0  CL 102  CO2 23  GLUCOSE 410*  BUN 20  CREATININE 1.19  CALCIUM 9.1  GFRNONAA >60  ANIONGAP 9    No results for input(s): PROT, ALBUMIN, AST, ALT, ALKPHOS, BILITOT in the last 168 hours. Hematology Recent Labs  Lab 08/15/21 1235  WBC 8.7  RBC 4.78  HGB 12.6*  HCT 40.1  MCV 83.9  MCH 26.4  MCHC 31.4  RDW 14.2  PLT 309   BNPNo results for input(s): BNP, PROBNP in the last 168 hours.  DDimer No results for input(s): DDIMER in the last 168 hours.   Radiology/Studies:  DG Chest 2 View  Result Date: 08/15/2021 CLINICAL DATA:  history of asthma/COPD, PE on anticoagulation, DM, HTN, and ACS s/p CABG presents to the emergency department with chest pain. has been ongoing since midnight. He states intermittently since he was hospitalized for chest pain 1 month ago. EXAM: CHEST - 2 VIEW COMPARISON:  07/16/2021 and previous FINDINGS: Relatively low lung volumes with prominent interstitial markings at the left lung base as before. Heart size and mediastinal contours are within normal  limits. CABG markers. No effusion. Sternotomy wires. Mild wedge deformity in lower thoracic vertebral bodies as before. IMPRESSION: No acute disease post CABG. Electronically Signed   By: Corlis Leak M.D.   On: 08/15/2021 13:02     Assessment and Plan:   CAD s/p dLCx and s/p CABG (LIMA-->LAD, SVG-->PDA, OM1, D1), PAD with stents to the R extremity with prior R and L toe amputations HTN and HLD Obstructive - symptomatic with concerns fo unstable angina vs NSTEMI - continue ASA 81 mg (load ordered in ED) - heparin drip - continue statin, goal LDL < 70; check in AM labs - continue BB - starting nitro drip: if improves will work 08/16/21 for transition to PO Imdur and ARB - will get echo - if worsening despite medical titration we have discussed LHC - Risks and benefits of cardiac catheterization have been discussed with the patient.  These include bleeding, infection, kidney damage, stroke, heart attack, death.  The patient understands these risks and is willing to proceed. - L Radial +2 and R fem +2  COPD with significant prior smoking history - return home meds, if worsening rhonchi  will reach out to [facility]  Ischemic cardiomyopathy f unclear status  Diabetes with diabetes neuropathy  Chronic ulcer of both feet  great toe - well healed if worsening will call wound care - hypergylcemia will return home meds and get diabetic coordinator involvement  Morbid Obesity and OSA - CPAP orders (nocturnal)  Hx of DVT and PE on xarelto in the past - unclear provoked on unprovoked status - heparin as above for now  Full Code Diet Carb consistent and Heart healthy-> NPO at midnight  DVT PPX- Heparin as above  Labs ordered for AM  Risk Assessment/Risk Scores:    TIMI Risk Score for Unstable Angina or Non-ST Elevation MI:   The patient's TIMI risk score is 4, which indicates a 20% risk of all cause mortality, new or recurrent myocardial infarction or need for urgent revascularization in the  next 14 days.  New York Heart Association (NYHA) Functional Class NYHA Class III     Severity of Illness: The appropriate patient status for this patient is INPATIENT. Inpatient status is judged to be reasonable and necessary in order to provide the required intensity of service to ensure the patient's safety. The patient's presenting symptoms, physical exam findings, and initial radiographic and laboratory data in the context of their chronic comorbidities is felt to place them at high risk for further clinical deterioration. Furthermore, it is not anticipated that the patient will be medically stable for discharge from the hospital within 2 midnights of admission. The following factors support the patient status of inpatient.   " The patient's presenting symptoms include angina and DOE. " The worrisome physical exam findings include respiratory wheezes SBP 190 on admission. " The initial radiographic and laboratory data are worrisome because of troponin 23 on initial. " The chronic co-morbidities include prior CAD.   * I certify that at the point of admission it is my clinical judgment that the patient will require inpatient hospital care spanning beyond 2 midnights from the point of admission due to high intensity of service, high risk for further deterioration and high frequency of surveillance required.*   For questions or updates, please contact [facility] Please consult [email or web address] for contact info under     Signed, Christell Constant, MD  08/15/2021 5:27 PM

## 2021-08-15 NOTE — ED Provider Notes (Signed)
Emergency Medicine Provider Triage Evaluation Note  Joseph [name] , a 50 y.o. male  was evaluated in triage.  Joseph [name] is a 50 y.o. male with past history of asthma/COPD, PE on anticoagulation, DM, HTN, and ACS s/p CABG presents to the emergency department with chest pain.   Has been ongoing since midnight.  He states intermittently since he was hospitalized for chest pain 1 month ago. Nausea or vomiting.  Seems that his pain could be elicited from anything " it can happen when I believe my is or when I am laying still or walking"  He states he took nitroglycerin x3 and had some mild improvement but came to the ER for persistent chest pain.  Review of Systems  Positive: Chest pain Negative: Fever  Physical Exam  BP (!) 171/11 (BP Location: Right Arm)   Pulse (!) 104   Temp 98.5 F (36.9 C) (Oral)   Resp 18   SpO2 98%  Gen:   Awake, no distress   Resp:  Normal effort  MSK:   Moves extremities without difficulty  Other:  Bilateral lower extremities with evidence of peripheral vascular disease and some scabs present to the lower extremities  Medical Decision Making  Medically screening exam initiated at 12:26 PM.  Appropriate orders placed.  Hamdan Toscano Derner was informed that the remainder of the evaluation will be completed by another provider, this initial triage assessment does not replace that evaluation, and the importance of remaining in the ED until their evaluation is complete.     Solon Augusta [place], [place] 08/15/21 1232    Lorre Nick, MD 08/18/21 [phone]

## 2021-08-15 NOTE — Telephone Encounter (Signed)
Around midnight patient started to having left-sided squeezing chest tightness with radiation to left arm and shoulder.  Associated with shortness of breath but reports chronic dyspnea.  Symptoms similar prior to his CABG. he took sublingual nitroglycerin x3 2 hours ago with improved pressure/tightness to 6 out of 10 from 8 out of 10.  During my evaluation he was still having significant chest pain 6/10 with radiation to left arm.  Pressure 190/126.  He has already took his carvedilol and furosemide.  Recommended ER visit.  He does not want to call EMS.  He will come to ER.

## 2021-08-15 NOTE — ED Provider Notes (Signed)
[facility] EMERGENCY DEPARTMENT Provider Note   CSN: [id number] Arrival date & time: 08/15/21  1209     History Chief Complaint  Patient presents with   Chest Pain    Joseph [name] is a 50 y.o. male.  The history is provided by the patient and medical records. No language interpreter was used.  Chest Pain Pain location:  Substernal area and L chest Pain quality: aching, crushing, dull, pressure and radiating   Pain radiates to:  L jaw, neck and L shoulder Pain severity:  Moderate Onset quality:  Gradual Duration:  1 day Timing:  Constant Progression:  Waxing and waning Chronicity:  Recurrent Relieved by:  Nitroglycerin Worsened by:  Exertion Ineffective treatments:  None tried Associated symptoms: diaphoresis and shortness of breath   Associated symptoms: no abdominal pain, no altered mental status, no back pain, no cough, no fatigue, no fever, no headache, no lower extremity edema, no nausea, no near-syncope, no palpitations, no vomiting and no weakness   Risk factors: coronary artery disease       Past Medical History:  Diagnosis Date   Anxiety    Arthritis    "knees, shoulders, hips, ankles" (07/29/2016)   Asthma    CAD (coronary artery disease)    a. 2017: s/p BMS to distal Cx; b. LHC 05/30/2019: 80% mid, distal RCA s/p DES, 30% narrowing of d LM, widely patent LAD w/ luminal irregularities, widely patent stent in dCX  w/ 90+% stenosis distal to stent beofre small trifurcating obtuse marginal (potentially area of restenosis)   Chronic bronchitis (HCC)    Chronic ulcer of right great toe (HCC) 09/19/2017   COPD (chronic obstructive pulmonary disease) (HCC)    Depression    DVT (deep venous thrombosis) (HCC)    GERD (gastroesophageal reflux disease)    Hyperlipidemia    Hypertension    Myocardial infarction (HCC) 1996   "light one"   PE (pulmonary embolism) 04/2013   On chronic Xarelto   Peripheral nerve disease    Pneumonia "several  times"   Sleep apnea    Type II diabetes mellitus (HCC)     Patient Active Problem List   Diagnosis Date Noted   Atypical chest pain 07/17/2021   Hyponatremia 07/17/2021   Leg wound, left 07/17/2021   Essential hypertension 07/17/2021   Acute hyperglycemia 07/09/2021   Left leg cellulitis 04/11/2021   Cellulitis of left leg 03/05/2021   Cellulitis 02/18/2021   PAD (peripheral artery disease) (HCC) 10/10/2020   Osteomyelitis of fourth toe of right foot (HCC)    S/P CABG x 4 07/24/2020   Non-ST elevation (NSTEMI) myocardial infarction (HCC) 07/22/2020   Acute on chronic diastolic heart failure (HCC) 07/22/2020   Hypotension 07/18/2020   Left leg swelling 07/18/2020   Elevated troponin 07/18/2020   AKI (acute kidney injury) (HCC) 07/18/2020   Elevated brain natriuretic peptide (BNP) level 07/18/2020   GERD (gastroesophageal reflux disease) 07/18/2020   Severe Sepsis secondary to Lt leg/Foot Cellulitis 07/18/2020   Severe sepsis --Severe Sepsis secondary to Lt leg/Foot Cellulitis with hypotension and AKI 07/18/2020   Unstable angina (HCC) 11/01/2019   Edema 06/13/2019   Leukocytosis 05/31/2019   Smoker 03/08/2019   Chronic pain 03/08/2019   Chronic back pain 02/16/2019   Deep venous thrombosis (HCC) 02/16/2019   Obesity, Class III, BMI 40-49.9 (morbid obesity) (HCC) 02/16/2019   Peripheral nerve disease 02/16/2019   COPD (chronic obstructive pulmonary disease) (HCC) 01/22/2019   Anxiety disorder 03/26/2018   Open  wound of toe 10/12/2017   Sinusitis 10/12/2017   Abdominal pain    Loss of weight    Diabetic foot infection (HCC) 09/16/2017   Uncontrolled type 2 diabetes mellitus with hyperglycemia, with long-term current use of insulin (HCC) 09/16/2017   Cellulitis of right foot    Chronic ulcer of great toe of right foot (HCC) 09/09/2017   Recurrent chest pain 07/29/2016   Hyperglycemia    Insulin dependent type 2 diabetes mellitus (HCC) 04/29/2015   OSA (obstructive sleep  apnea) 05/08/2013   History of pulmonary embolus (PE) 04/27/2013   CAD -S/P PCI 11/02/19  09/10/2008   Hyperlipidemia 09/09/2008    Past Surgical History:  Procedure Laterality Date   ABDOMINAL AORTOGRAM W/LOWER EXTREMITY N/A 10/10/2020   Procedure: ABDOMINAL AORTOGRAM W/LOWER EXTREMITY;  Surgeon: Sherren KernsFields, Charles E, MD;  Location: [facility];  Service: Cardiovascular;  Laterality: N/A;   AMPUTATION Right 09/21/2017   Procedure: RIGHT GREAT TOE AMPUTATION, POSSIBLE VAC;  Surgeon: Tarry KosXu, Naiping M, MD;  Location: [facility] OR;  Service: Orthopedics;  Laterality: Right;   AMPUTATION Left 08/13/2020   Procedure: LEFT FOOT 4TH RAY AMPUTATION;  Surgeon: Nadara Mustarduda, Marcus V, MD;  [facility];  Service: Orthopedics;  Laterality: Left;   AORTOGRAM Bilateral 03/13/2021   Procedure: ABDOMINAL AORTOGRAM WITH Left LOWER EXTREMITY RUNOFF;  Surgeon: Leonie DouglasHawken, Thomas N, MD;  [facility];  Service: Vascular;  Laterality: Bilateral;   CARDIAC CATHETERIZATION  2006   CARDIAC CATHETERIZATION  1996   "@ [facility]; when I had my heart attack"   CARDIAC CATHETERIZATION N/A 07/29/2016   Procedure: Left Heart Cath and Coronary Angiography;  Surgeon: Runell GessJonathan J Berry, MD;  [facility];  Service: Cardiovascular;  Laterality: N/A;   CARDIAC CATHETERIZATION N/A 07/29/2016   Procedure: Coronary Stent Intervention;  Surgeon: Runell GessJonathan J Berry, MD;  [facility]: [facility];  Service: Cardiovascular;  Laterality: N/A;   CARPAL TUNNEL RELEASE Bilateral    CORONARY ANGIOPLASTY WITH STENT PLACEMENT  07/29/2016   CORONARY ARTERY BYPASS GRAFT N/A 07/24/2020   Procedure: CORONARY ARTERY BYPASS GRAFTING (CABG), ON PUMP, TIMES FOUR, USING LEFT INTERNAL MAMMARY ARTERY AND ENDOSCOPICALLY HARVESTED RIGHT GREATER SAPHENOUS VEIN;  Surgeon: Corliss SkainsLightfoot, Harrell O, MD;  Location: [facility] OR;  Service: Open Heart Surgery;  Laterality: N/A;  FLOW TAC   CORONARY STENT INTERVENTION N/A 05/30/2019   Procedure: CORONARY STENT INTERVENTION;   Surgeon: Lyn RecordsSmith, Henry W, MD;  [facility]: [facility];  Service: Cardiovascular;  Laterality: N/A;   CORONARY STENT INTERVENTION N/A 11/02/2019   Procedure: CORONARY STENT INTERVENTION;  Surgeon: Iran OuchArida, Muhammad A, MD;  [facility]: [facility];  Service: Cardiovascular;  Laterality: N/A;   ESOPHAGOGASTRODUODENOSCOPY N/A 09/22/2017   Procedure: ESOPHAGOGASTRODUODENOSCOPY (EGD);  Surgeon: Meryl DareStark, Malcolm T, MD;  [facility];  Service: Endoscopy;  Laterality: N/A;   INTRAVASCULAR PRESSURE WIRE/FFR STUDY N/A 07/22/2020   Procedure: INTRAVASCULAR PRESSURE WIRE/FFR STUDY;  Surgeon: Marykay LexHarding, David W, MD;  [facility];  Service: Cardiovascular;  Laterality: N/A;   KNEE ARTHROSCOPY Bilateral    "2 on left; 1 on the right"   LEFT HEART CATH AND CORONARY ANGIOGRAPHY N/A 11/02/2019   Procedure: LEFT HEART CATH AND CORONARY ANGIOGRAPHY;  Surgeon: Iran OuchArida, Muhammad A, MD;  [facility];  Service: Cardiovascular;  Laterality: N/A;   LEFT HEART CATH AND CORONARY ANGIOGRAPHY N/A 07/22/2020   Procedure: LEFT HEART CATH AND CORONARY ANGIOGRAPHY;  Surgeon: Marykay LexHarding, David W, MD;  [facility];  Service: Cardiovascular;  Laterality: N/A;   PERIPHERAL VASCULAR INTERVENTION Left 10/11/2020   popliteal and SFA stent placement    PERIPHERAL VASCULAR INTERVENTION Left 10/10/2020   Procedure: PERIPHERAL VASCULAR INTERVENTION;  Surgeon: Sherren Kerns, MD;  [facility];  Service: Cardiovascular;  Laterality: Left;   RIGHT/LEFT HEART CATH AND CORONARY ANGIOGRAPHY N/A 05/30/2019   Procedure: RIGHT/LEFT HEART CATH AND CORONARY ANGIOGRAPHY;  Surgeon: Lyn Records, MD;  [facility];  Service: Cardiovascular;  Laterality: N/A;   SHOULDER OPEN ROTATOR CUFF REPAIR Bilateral    TEE WITHOUT CARDIOVERSION N/A 07/24/2020   Procedure: TRANSESOPHAGEAL ECHOCARDIOGRAM (TEE);  Surgeon: Corliss Skains, MD;  [facility];  Service: Open  Heart Surgery;  Laterality: N/A;       Family History  Problem Relation Age of Onset   Hypertension Brother    Hypertension Father    Diabetes Other    Hyperlipidemia Other     Social History   Tobacco Use   Smoking status: Former    Packs/day: 1.00    Years: 34.00    Pack years: 34.00    Types: Cigarettes    Quit date: 07/12/2020    Years since quitting: 1.0   Smokeless tobacco: Former    Types: Snuff, Chew  Vaping Use   Vaping Use: Never used  Substance Use Topics   Alcohol use: Yes    Comment: RARE   Drug use: No    Home Medications Prior to Admission medications   Medication Sig Start Date End Date Taking? Authorizing Provider  albuterol (VENTOLIN HFA) 108 (90 Base) MCG/ACT inhaler Inhale 2 puffs into the lungs every 6 (six) hours as needed for wheezing or shortness of breath.    [provider]  ALPRAZolam Prudy Feeler) 1 MG tablet Take 1 mg by mouth See admin instructions. 1 tablet by mouth twice daily and 2 tabs at bedtime.    [provider]  aspirin EC 81 MG tablet Take 1 tablet (81 mg total) by mouth daily. Swallow whole. 03/14/21   Glenford Bayley, MD  atorvastatin (LIPITOR) 80 MG tablet Take 1 tablet (80 mg total) by mouth daily at 6 PM. 05/31/19   Corrin Parker, PA-C  buPROPion [facility] SR) 150 MG 12 hr tablet Take 150 mg by mouth 2 (two) times daily.  08/17/16   [provider]  carvedilol (COREG) 3.125 MG tablet Take 3.125 mg by mouth 2 (two) times daily. 05/13/21   [provider]  clopidogrel (PLAVIX) 75 MG tablet Take 1 tablet (75 mg total) by mouth daily with breakfast. 10/11/20   Lars Mage, PA-C  escitalopram (LEXAPRO) 10 MG tablet Take 10 mg by mouth in the morning. 06/10/20   [provider]  fenofibrate (TRICOR) 145 MG tablet Take 145 mg by mouth in the morning.    [provider]  furosemide (LASIX) 40 MG tablet Take 1 tablet (40 mg total) by mouth daily. 05/13/21 08/11/21  Chilton Si,  MD  insulin aspart (NOVOLOG) 100 UNIT/ML FlexPen Inject 12 Units into the skin 3 (three) times daily with meals. 07/13/21   Vassie Loll, MD  insulin detemir (LEVEMIR) 100 UNIT/ML FlexPen Inject 35 Units into the skin 2 (two) times daily. 07/13/21   Vassie Loll, MD  liraglutide (VICTOZA) 18 MG/3ML SOPN Inject 1.8 mg into the skin daily. 07/17/21   Sherryll Burger, Pratik D, DO  meclizine (ANTIVERT) 25 MG tablet Take 25 mg by mouth 3 (three) times daily  as needed for dizziness. Take 50 mg by mouth in the morning & 25 mg by mouth at night 04/10/13   [provider]  metFORMIN (GLUCOPHAGE) 1000 MG tablet Take 1 tablet (1,000 mg total) by mouth 2 (two) times daily with a meal. 11/05/19   Leone Brand, NP  midodrine (PROAMATINE) 5 MG tablet Take 5 mg by mouth 3 (three) times daily with meals.    [provider]  naloxone [facility]) nasal spray 4 mg/0.1 mL Place 4 mg into the nose as needed (overdose).    [provider]  nitroGLYCERIN (NITROSTAT) 0.4 MG SL tablet Place 0.4 mg under the tongue every 5 (five) minutes as needed for chest pain.    [provider]  oxyCODONE (ROXICODONE) 15 MG immediate release tablet Take 1 tablet (15 mg total) by mouth 5 (five) times daily. 03/14/21   Glenford Bayley, MD  pantoprazole (PROTONIX) 40 MG tablet Take 1 tablet (40 mg total) by mouth daily. 09/23/17   Hongalgi, Maximino Greenland, MD  pregabalin (LYRICA) 150 MG capsule Take 150 mg by mouth 3 (three) times daily. 05/06/19   [provider]  promethazine (PHENERGAN) 25 MG tablet Take 25 mg by mouth every 4 (four) hours as needed for nausea or vomiting.    [provider]    Allergies    Sulfa antibiotics  Review of Systems   Review of Systems  Constitutional:  Positive for diaphoresis. Negative for chills, fatigue and fever.  HENT:  Negative for congestion.   Eyes:  Negative for visual disturbance.  Respiratory:  Positive for chest tightness and shortness of breath. Negative  for cough and wheezing.   Cardiovascular:  Positive for chest pain. Negative for palpitations, leg swelling and near-syncope.  Gastrointestinal:  Negative for abdominal pain, constipation, diarrhea, nausea and vomiting.  Genitourinary:  Negative for frequency.  Musculoskeletal:  Negative for back pain.  Neurological:  Negative for weakness, light-headedness and headaches.  Psychiatric/Behavioral:  Negative for agitation.   All other systems reviewed and are negative.  Physical Exam Updated Vital Signs BP (!) 165/91 (BP Location: Left Arm)   Pulse 91   Temp 98.3 F (36.8 C)   Resp 16   Ht 6\' 5"  (1.956 m)   Wt (!) 156.5 kg   SpO2 98%   BMI 40.91 kg/m   Physical Exam Vitals and nursing note reviewed.  Constitutional:      General: He is not in acute distress.    Appearance: He is well-developed. He is not ill-appearing, toxic-appearing or diaphoretic.  HENT:     Head: Normocephalic and atraumatic.  Eyes:     Conjunctiva/sclera: Conjunctivae normal.     Pupils: Pupils are equal, round, and reactive to light.  Cardiovascular:     Rate and Rhythm: Normal rate and regular rhythm.     Heart sounds: Normal heart sounds. No murmur heard. Pulmonary:     Effort: Pulmonary effort is normal. No respiratory distress.     Breath sounds: Normal breath sounds. No decreased breath sounds, wheezing, rhonchi or rales.  Chest:     Chest wall: No tenderness.  Abdominal:     Palpations: Abdomen is soft.     Tenderness: There is no abdominal tenderness.  Musculoskeletal:     Cervical back: Neck supple.     Right lower leg: No tenderness. No edema.     Left lower leg: No tenderness. No edema.  Skin:    General: Skin is warm and dry.  Findings: No erythema.  Neurological:     General: No focal deficit present.     Mental Status: He is alert.  Psychiatric:        Mood and Affect: Mood normal.    ED Results / Procedures / Treatments   Labs (all labs ordered are listed, but only  abnormal results are displayed) Labs Reviewed  BASIC METABOLIC PANEL - Abnormal; Notable for the following components:      Result Value   Sodium 134 (*)    Glucose, Bld 410 (*)    All other components within normal limits  CBC - Abnormal; Notable for the following components:   Hemoglobin 12.6 (*)    All other components within normal limits  CBG MONITORING, ED - Abnormal; Notable for the following components:   Glucose-Capillary 356 (*)    All other components within normal limits  TROPONIN I (HIGH SENSITIVITY) - Abnormal; Notable for the following components:   Troponin I (High Sensitivity) 23 (*)    All other components within normal limits  TROPONIN I (HIGH SENSITIVITY) - Abnormal; Notable for the following components:   Troponin I (High Sensitivity) 18 (*)    All other components within normal limits  HEMOGLOBIN A1C  BASIC METABOLIC PANEL  LIPID PANEL  CBC  TROPONIN I (HIGH SENSITIVITY)    EKG None  Radiology DG Chest 2 View  Result Date: 08/15/2021 CLINICAL DATA:  history of asthma/COPD, PE on anticoagulation, DM, HTN, and ACS s/p CABG presents to the emergency department with chest pain. has been ongoing since midnight. He states intermittently since he was hospitalized for chest pain 1 month ago. EXAM: CHEST - 2 VIEW COMPARISON:  07/16/2021 and previous FINDINGS: Relatively low lung volumes with prominent interstitial markings at the left lung base as before. Heart size and mediastinal contours are within normal limits. CABG markers. No effusion. Sternotomy wires. Mild wedge deformity in lower thoracic vertebral bodies as before. IMPRESSION: No acute disease post CABG. Electronically Signed   By: Corlis Leak M.D.   On: 08/15/2021 13:02    Procedures Procedures   Medications Ordered in ED Medications  nitroGLYCERIN 50 mg in dextrose 5 % 250 mL (0.2 mg/mL) infusion (5 mcg/min Intravenous New Bag/Given 08/15/21 1728)  aspirin EC tablet 81 mg (81 mg Oral Not Given 08/15/21  1728)  atorvastatin (LIPITOR) tablet 80 mg (has no administration in time range)  carvedilol (COREG) tablet 3.125 mg (has no administration in time range)  fenofibrate tablet 160 mg (has no administration in time range)  buPROPion (WELLBUTRIN SR) 12 hr tablet 150 mg (has no administration in time range)  escitalopram (LEXAPRO) tablet 10 mg (has no administration in time range)  pantoprazole (PROTONIX) EC tablet 40 mg (has no administration in time range)  clopidogrel (PLAVIX) tablet 75 mg (has no administration in time range)  pregabalin (LYRICA) capsule 150 mg (has no administration in time range)  albuterol (VENTOLIN HFA) 108 (90 Base) MCG/ACT inhaler 2 puff (has no administration in time range)  nitroGLYCERIN (NITROSTAT) SL tablet 0.4 mg (has no administration in time range)  acetaminophen (TYLENOL) tablet 650 mg (has no administration in time range)  ondansetron (ZOFRAN) injection 4 mg (has no administration in time range)  insulin detemir (LEVEMIR) injection 20 Units (has no administration in time range)  insulin aspart (novoLOG) injection 0-15 Units (has no administration in time range)  insulin aspart (novoLOG) injection 0-5 Units (has no administration in time range)  insulin aspart (novoLOG) injection 6 Units (has no administration  in time range)    ED Course  I have reviewed the triage vital signs and the nursing notes.  Pertinent labs & imaging results that were available during my care of the patient were reviewed by me and considered in my medical decision making (see chart for details).    MDM Rules/Calculators/A&P                           Joseph [name] is a 50 y.o. male with a past medical history significant for CAD status post PCI and CABG, chronic leg wound, diabetes, hypertension, hyperlipidemia, prior DVT and PE, COPD, and asthma who presents at the direction of his cardiology for work-up of chest pain.  Corded patient, he has had chest pain on and off recently but  starting at midnight last night the pain started and has not resolved.  He reports it is exertional and is in his left central chest.  It is a pressure and tightness and squeezing pain that goes to his left neck and his left shoulder and arm.  He reports he feels "the same" as his last MI that needed a CABG.  He reports some diaphoresis and shortness of breath and denies any vomiting.  He denies any syncope.  He denies any new leg pain or leg swelling and also denies fevers, chills, ingestion, cough.  Denies abdominal pain or back pain.  Denies trauma.  Denies rashes.  Denies other complaints.  He reports the pain was severe initially and has improved after 3 nitroglycerin at home.  He called his cardiology who told him to come to the emergency department evaluation.  On exam, lungs are clear and chest is nontender.  I cannot reproduce his discomfort.  Abdomen is nontender.  Pulses present in extremities with no evidence of acute edema in the legs.  He does have chronic wounds on the left shin which he reports is unchanged from baseline.  Patient was not significantly tachycardic, tachypneic, hypoxic, febrile, or hypotensive.  Patient is now resting with improved pain.  EKG does not show STEMI.  Patient has some screening lab work initially which showed slightly more elevated troponin of 23.  Otherwise CBC shows no leukocytosis and kidney function is normal at 1.1.  Chest x-ray reviewed no acute disease.  Will call cardiology to get their disposition recommendations  Cardiology reports that they will admit for further management.  Cardiology will admit.   Final Clinical Impression(s) / ED Diagnoses Final diagnoses:  Precordial pain    Clinical Impression: 1. Precordial pain     Disposition: Admit  This note was prepared with assistance of Dragon voice recognition software. Occasional wrong-word or sound-a-like substitutions may have occurred due to the inherent limitations of voice  recognition software.     Kaytie Ratcliffe, Canary Brim, MD 08/15/21 [phone]

## 2021-08-15 NOTE — ED Triage Notes (Signed)
Patient coming from home. Complaint of CP since midnight, took nitro x3 this morning and experienced some relief.

## 2021-08-15 NOTE — ED Notes (Signed)
Pt has hx of sleep apnea, desat to 80s while sleeping, 2L [place] applied

## 2021-08-15 NOTE — ED Notes (Signed)
Report attempted, asked to give floor a chance to assign the bed. Will call back in .

## 2021-08-15 NOTE — ED Notes (Signed)
Pt asking about pain medication, secure chat sent to admitting doctor

## 2021-08-15 NOTE — Progress Notes (Signed)
ANTICOAGULATION CONSULT NOTE - Initial Consult  Pharmacy Consult for Heparin Indication: chest pain/ACS  Allergies  Allergen Reactions   Sulfa Antibiotics Other (See Comments)    Headaches     Patient Measurements: Height: 6\' 5"  (195.6 cm) Weight: (!) 156.5 kg (345 lb) IBW/kg (Calculated) : 89.1 Heparin Dosing Weight: 124.9 kg  Vital Signs: Temp: 98.3 F (36.8 C) (08/27 1359) Temp Source: Oral (08/27 1213) BP: 150/95 (08/27 1600) Pulse Rate: 82 (08/27 1600)  Labs: Recent Labs    08/15/21 1235  HGB 12.6*  HCT 40.1  PLT 309  CREATININE 1.19  TROPONINIHS 23*    Estimated Creatinine Clearance: 124.7 mL/min (by C-G formula based on SCr of 1.19 mg/dL).   Medical History: Past Medical History:  Diagnosis Date   Anxiety    Arthritis    "knees, shoulders, hips, ankles" (07/29/2016)   Asthma    CAD (coronary artery disease)    a. 2017: s/p BMS to distal Cx; b. LHC 05/30/2019: 80% mid, distal RCA s/p DES, 30% narrowing of d LM, widely patent LAD w/ luminal irregularities, widely patent stent in dCX  w/ 90+% stenosis distal to stent beofre small trifurcating obtuse marginal (potentially area of restenosis)   Chronic bronchitis (HCC)    Chronic ulcer of right great toe (HCC) 09/19/2017   COPD (chronic obstructive pulmonary disease) (HCC)    Depression    DVT (deep venous thrombosis) (HCC)    GERD (gastroesophageal reflux disease)    Hyperlipidemia    Hypertension    Myocardial infarction (HCC) 1996   "light one"   PE (pulmonary embolism) 04/2013   On chronic Xarelto   Peripheral nerve disease    Pneumonia "several times"   Sleep apnea    Type II diabetes mellitus (HCC)     Medications:  (Not in a hospital admission)  Scheduled:  Infusions:   nitroGLYCERIN      Assessment: 50 yom with history of, DVT, PE, CAD s/p dLCx and s/p CABG (LIMA-->LAD, SVG-->PDA, OM1, D1),  PAD with stents to the R extremity with prior R and L toe amputations, chronic ulcer of both  feet  great toe;  COPD with significant prior smoking history, HTN and HLD, Ischemic cardiomyopathy. Presenting with angina. Heparin per pharmacy started for ACS.  Confirmed with patient and spouse patient is not on anticoagulation prior to arrival. Was previously on xarelto but this was stopped last year.  Hgb 12.6; plt 309  Goal of Therapy:  Heparin level 0.3-0.7 units/ml Monitor platelets by anticoagulation protocol: Yes   Plan:  Give 4000 units bolus x 1 Start heparin infusion at 1900 units/hr Check anti-Xa level in 6 hours and daily while on heparin Continue to monitor H&H and platelets  05/2013, PharmD, BCPS 08/15/2021 5:11 PM ED Clinical Pharmacist -  [phone]

## 2021-08-16 ENCOUNTER — Inpatient Hospital Stay (HOSPITAL_COMMUNITY): Payer: Medicare HMO

## 2021-08-16 DIAGNOSIS — R079 Chest pain, unspecified: Secondary | ICD-10-CM

## 2021-08-16 DIAGNOSIS — I200 Unstable angina: Secondary | ICD-10-CM | POA: Diagnosis not present

## 2021-08-16 LAB — LIPID PANEL
Cholesterol: 189 mg/dL (ref 0–200)
HDL: 31 mg/dL — ABNORMAL LOW (ref >40)
LDL Cholesterol: 95 mg/dL (ref 0–99)
Total CHOL/HDL Ratio: 6.1 RATIO
Triglycerides: 316 mg/dL — ABNORMAL HIGH (ref <150)
VLDL: 63 mg/dL — ABNORMAL HIGH (ref 0–40)

## 2021-08-16 LAB — CBC
HCT: 37.9 % — ABNORMAL LOW (ref 39.0–52.0)
Hemoglobin: 12.4 g/dL — ABNORMAL LOW (ref 13.0–17.0)
MCH: 26.8 pg (ref 26.0–34.0)
MCHC: 32.7 g/dL (ref 30.0–36.0)
MCV: 81.9 fL (ref 80.0–100.0)
Platelets: 267 10*3/uL (ref 150–400)
RBC: 4.63 MIL/uL (ref 4.22–5.81)
RDW: 14.5 % (ref 11.5–15.5)
WBC: 9.1 10*3/uL (ref 4.0–10.5)
nRBC: 0 % (ref 0.0–0.2)

## 2021-08-16 LAB — BASIC METABOLIC PANEL
Anion gap: 8 (ref 5–15)
BUN: 20 mg/dL (ref 6–20)
CO2: 23 mmol/L (ref 22–32)
Calcium: 8.8 mg/dL — ABNORMAL LOW (ref 8.9–10.3)
Chloride: 103 mmol/L (ref 98–111)
Creatinine, Ser: 1 mg/dL (ref 0.61–1.24)
GFR, Estimated: >60 mL/min (ref >60)
Glucose, Bld: 304 mg/dL — ABNORMAL HIGH (ref 70–99)
Potassium: 3.6 mmol/L (ref 3.5–5.1)
Sodium: 134 mmol/L — ABNORMAL LOW (ref 135–145)

## 2021-08-16 LAB — GLUCOSE, CAPILLARY
Glucose-Capillary: 283 mg/dL — ABNORMAL HIGH (ref 70–99)
Glucose-Capillary: 300 mg/dL — ABNORMAL HIGH (ref 70–99)
Glucose-Capillary: 217 mg/dL — ABNORMAL HIGH (ref 70–99)
Glucose-Capillary: 222 mg/dL — ABNORMAL HIGH (ref 70–99)

## 2021-08-16 LAB — HEPARIN LEVEL (UNFRACTIONATED)
Heparin Unfractionated: 0.21 IU/mL — ABNORMAL LOW (ref 0.30–0.70)
Heparin Unfractionated: >1.1 IU/mL — ABNORMAL HIGH (ref 0.30–0.70)
Heparin Unfractionated: 0.11 IU/mL — ABNORMAL LOW (ref 0.30–0.70)

## 2021-08-16 LAB — ECHOCARDIOGRAM COMPLETE
Area-P 1/2: 2.45 cm2
Height: 77 in
S' Lateral: 3.2 cm
Weight: 5515.2 oz

## 2021-08-16 LAB — MRSA NEXT GEN BY PCR, NASAL: MRSA by PCR Next Gen: DETECTED — AB

## 2021-08-16 MED ORDER — CHLORHEXIDINE GLUCONATE CLOTH 2 % EX PADS
6.0000 | MEDICATED_PAD | Freq: Every day | CUTANEOUS | Status: DC
  Administered 2021-08-16 – 2021-08-18 (×2): 6 via TOPICAL

## 2021-08-16 MED ORDER — SODIUM CHLORIDE 0.9 % WEIGHT BASED INFUSION
1.0000 mL/kg/h | INTRAVENOUS | Status: DC
  Administered 2021-08-17: 1 mL/kg/h via INTRAVENOUS

## 2021-08-16 MED ORDER — SODIUM CHLORIDE 0.9% FLUSH: 3.0000 mL | INTRAVENOUS | Status: DC | PRN

## 2021-08-16 MED ORDER — SODIUM CHLORIDE 0.9% FLUSH
3.0000 mL | Freq: Two times a day (BID) | INTRAVENOUS | Status: DC
  Administered 2021-08-18 – 2021-08-20 (×5): 3 mL via INTRAVENOUS

## 2021-08-16 MED ORDER — ASPIRIN 81 MG PO CHEW
81.0000 mg | CHEWABLE_TABLET | ORAL | Status: AC
  Administered 2021-08-17: 81 mg via ORAL
  Filled 2021-08-16: qty 1

## 2021-08-16 MED ORDER — PERFLUTREN LIPID MICROSPHERE
1.0000 mL | INTRAVENOUS | Status: AC | PRN
  Administered 2021-08-16: 2 mL via INTRAVENOUS
  Filled 2021-08-16: qty 10

## 2021-08-16 MED ORDER — OXYCODONE HCL 5 MG PO TABS
15.0000 mg | ORAL_TABLET | Freq: Three times a day (TID) | ORAL | Status: DC | PRN
  Administered 2021-08-16 – 2021-08-19 (×7): 15 mg via ORAL
  Filled 2021-08-16 (×7): qty 3

## 2021-08-16 MED ORDER — HEPARIN BOLUS VIA INFUSION
3000.0000 [IU] | Freq: Once | INTRAVENOUS | Status: AC
  Administered 2021-08-16: 3000 [IU] via INTRAVENOUS
  Filled 2021-08-16: qty 3000

## 2021-08-16 MED ORDER — MUPIROCIN 2 % EX OINT
1.0000 "application " | TOPICAL_OINTMENT | Freq: Two times a day (BID) | CUTANEOUS | Status: DC
  Administered 2021-08-16 – 2021-08-20 (×8): 1 via NASAL
  Filled 2021-08-16: qty 22

## 2021-08-16 MED ORDER — SODIUM CHLORIDE 0.9 % IV SOLN
250.0000 mL | INTRAVENOUS | Status: DC | PRN
Start: 2021-08-16 — End: 2021-08-17

## 2021-08-16 MED ORDER — HEPARIN (PORCINE) 25000 UT/250ML-% IV SOLN
2250.0000 [IU]/h | INTRAVENOUS | Status: DC
  Administered 2021-08-16: 2100 [IU]/h via INTRAVENOUS
  Filled 2021-08-16: qty 250

## 2021-08-16 MED ORDER — ROSUVASTATIN CALCIUM 20 MG PO TABS
40.0000 mg | ORAL_TABLET | Freq: Every evening | ORAL | Status: DC
  Administered 2021-08-16 – 2021-08-20 (×5): 40 mg via ORAL
  Filled 2021-08-16 (×5): qty 2

## 2021-08-16 MED ORDER — SODIUM CHLORIDE 0.9 % WEIGHT BASED INFUSION
3.0000 mL/kg/h | INTRAVENOUS | Status: DC
  Administered 2021-08-17: 3 mL/kg/h via INTRAVENOUS

## 2021-08-16 NOTE — Progress Notes (Signed)
  Echocardiogram 2D Echocardiogram has been performed.  Joseph [name] 08/16/2021, 11:59 AM

## 2021-08-16 NOTE — Progress Notes (Signed)
Progress Note  Patient Name: Joseph [name] Date of Encounter: 08/16/2021  [facility] Cardiologist: Chilton Si, MD   Subjective   Intermittent chest pains, this AM sleeping comfortably when I entered.   Inpatient Medications    Scheduled Meds:  ALPRAZolam  1 mg Oral QHS   aspirin EC  81 mg Oral Daily   atorvastatin  80 mg Oral q1800   buPROPion  150 mg Oral BID   carvedilol  3.125 mg Oral BID   Chlorhexidine Gluconate Cloth  6 each Topical Q0600   clopidogrel  75 mg Oral Q breakfast   escitalopram  10 mg Oral q AM   fenofibrate  160 mg Oral Daily   insulin aspart  0-15 Units Subcutaneous TID WC   insulin aspart  0-5 Units Subcutaneous QHS   insulin aspart  6 Units Subcutaneous TID WC   insulin glargine-yfgn  60 Units Subcutaneous BID   mupirocin ointment  1 application Nasal BID   pantoprazole  40 mg Oral Daily   pregabalin  150 mg Oral QHS   pregabalin  300 mg Oral Daily   Continuous Infusions:  heparin 2,300 Units/hr (08/16/21 0700)   nitroGLYCERIN 10 mcg/min (08/16/21 0700)   PRN Meds: acetaminophen, albuterol, nitroGLYCERIN, ondansetron (ZOFRAN) IV   Vital Signs    Vitals:   08/15/21 2333 08/16/21 0102 08/16/21 0115 08/16/21 0500  BP: 118/85     Pulse: 80     Resp: 16  16 18   Temp: 98.1 F (36.7 C)   97.7 F (36.5 C)  TempSrc: Oral   Oral  SpO2: 99% 93% 97%   Weight:    (!) 156.4 kg  Height:        Intake/Output Summary (Last 24 hours) at 08/16/2021 0820 Last data filed at 08/16/2021 0700 Gross per 24 hour  Intake 847.57 ml  Output 500 ml  Net 347.57 ml   Last 3 Weights 08/16/2021 08/15/2021 08/15/2021  Weight (lbs) 344 lb 11.2 oz 342 lb 9.6 oz 345 lb  Weight (kg) 156.355 kg 155.402 kg 156.491 kg      Telemetry    SR, rare PVCs - Personally Reviewed  ECG    N/a - Personally Reviewed  Physical Exam   GEN: No acute distress.   Neck: No JVD Cardiac: RRR, no murmurs, rubs, or gallops.  Respiratory: Clear to auscultation  bilaterally. GI: Soft, nontender, non-distended  MS: No edema; No deformity. Neuro:  Nonfocal  Psych: Normal affect   Labs    High Sensitivity Troponin:   Recent Labs  Lab 08/15/21 1235 08/15/21 1520 08/15/21 1745 08/15/21 2019  TROPONINIHS 23* 18* 25* 20*      Chemistry Recent Labs  Lab 08/15/21 1235 08/16/21 0312  NA 134* 134*  K 4.0 3.6  CL 102 103  CO2 23 23  GLUCOSE 410* 304*  BUN 20 20  CREATININE 1.19 1.00  CALCIUM 9.1 8.8*  GFRNONAA >60 >60  ANIONGAP 9 8     Hematology Recent Labs  Lab 08/15/21 1235 08/16/21 0312  WBC 8.7 9.1  RBC 4.78 4.63  HGB 12.6* 12.4*  HCT 40.1 37.9*  MCV 83.9 81.9  MCH 26.4 26.8  MCHC 31.4 32.7  RDW 14.2 14.5  PLT 309 267    BNPNo results for input(s): BNP, PROBNP in the last 168 hours.   DDimer No results for input(s): DDIMER in the last 168 hours.   Radiology    DG Chest 2 View  Result Date: 08/15/2021 CLINICAL DATA:  history of asthma/COPD, PE on anticoagulation, DM, HTN, and ACS s/p CABG presents to the emergency department with chest pain. has been ongoing since midnight. He states intermittently since he was hospitalized for chest pain 1 month ago. EXAM: CHEST - 2 VIEW COMPARISON:  07/16/2021 and previous FINDINGS: Relatively low lung volumes with prominent interstitial markings at the left lung base as before. Heart size and mediastinal contours are within normal limits. CABG markers. No effusion. Sternotomy wires. Mild wedge deformity in lower thoracic vertebral bodies as before. IMPRESSION: No acute disease post CABG. Electronically Signed   By: Corlis Leak M.D.   On: 08/15/2021 13:02    Cardiac Studies    Patient Profile     Joseph [name] is a 50 y.o. male with CAD s/p dLCx and s/p CABG (LIMA-->LAD, SVG-->PDA, OM1, D1),  PAD with stents to the R extremity with prior R and L toe amputations, chronic ulcer of both feet  great toe;  COPD with significant prior smoking history, HTN and HLD, Ischemic  cardiomyopathy f unclear status Diabetes with diabetes neuropathy, prior hx of Morbid Obesity, OSA unclear if on CPAP DVT and PE on xarelto who presented wit angina.  Assessment & Plan    CAD/Chest pain - history of CAD with CABG in 2021, prior pci to LCX in 2017 - presents with chest pain concerning for unstable angina. Very mild flat troponin, EKG no acute ischemic changes - echo pending  - medical therapy with hep gtt, ASA 81, atorva 80, coreg 3.125mg  bid, plavix 75, nitro gtt.  - plan for cath Monday.   I have reviewed the risks, indications, and alternatives to cardiac catheterization, possible angioplasty, and stenting with the patient today. Risks include but are not limited to bleeding, infection, vascular injury, stroke, myocardial infection, arrhythmia, kidney injury, radiation-related injury in the case of prolonged fluoroscopy use, emergency cardiac surgery, and death. The patient understands the risks of serious complication is 1-2 in 1000 with diagnostic cardiac cath and 1-2% or less with angioplasty/stenting.    2. COPD   3. PAD - prior interventions  4. Hyperlipidemia - LDL 95, change atorva to crestor 40mg  daily.   For questions or updates, please contact [facility] Please consult [email or web address] for contact info under        Signed, , MD  08/16/2021, 8:20 AM

## 2021-08-16 NOTE — Progress Notes (Signed)
Inpatient Diabetes Program Recommendations  AACE/ADA: New Consensus Statement on Inpatient Glycemic Control (2015)  Target Ranges:  Prepandial:   less than 140 mg/dL      Peak postprandial:   less than 180 mg/dL (1-2 hours)      Critically ill patients:  140 - 180 mg/dL   Lab Results  Component Value Date   GLUCAP 300 (H) 08/16/2021   HGBA1C 11.8 (H) 08/15/2021    Review of Glycemic Control Results for Joseph [name], Joseph [name] (MRN [id number]) as of 08/16/2021 15:20  Ref. Range 08/15/2021 15:23 08/15/2021 21:09 08/16/2021 06:12 08/16/2021 11:43  Glucose-Capillary Latest Ref Range: 70 - 99 mg/dL 840 (H) 375 (H) 436 (H) 300 (H)   Diabetes history: DM 2 Outpatient Diabetes medications: Metformin 1000 mg bid, Victoza 1.8 mg Daily, Novolog 12 units tid, Levemir 60 units bid Current orders for Inpatient glycemic control:  Novolog 0-15 units tid + hs Novolog 6 units tid meal coverage Semglee 60 units bid  Inpatient Diabetes Program Recommendations:    - Increase Semglee 70 units bid  - Increase meal coverage to Novolog 12 units tid meal coverage if eating >50% of meals, hold if NPO or glucose less than 80.   Thanks,  Christena Deem RN, MSN, BC-ADM Inpatient Diabetes Coordinator Team Pager [phone] (8a-5p)

## 2021-08-16 NOTE — Progress Notes (Signed)
ANTICOAGULATION CONSULT NOTE Pharmacy Consult for Heparin Indication: chest pain/ACS  Allergies  Allergen Reactions   Sulfa Antibiotics Other (See Comments)    Headaches     Patient Measurements: Height: 6\' 5"  (195.6 cm) Weight: (!) 156.4 kg (344 lb 11.2 oz) IBW/kg (Calculated) : 89.1 Heparin Dosing Weight: 125 kg  Vital Signs: Temp: 98.2 F (36.8 C) (08/28 1607) Temp Source: Oral (08/28 1607) BP: 90/72 (08/28 1607) Pulse Rate: 79 (08/28 1607)  Labs: Recent Labs    08/15/21 1235 08/15/21 1520 08/15/21 1745 08/15/21 1802 08/15/21 2019 08/16/21 0312 08/16/21 0924 08/16/21 1841  HGB 12.6*  --   --   --   --  12.4*  --   --   HCT 40.1  --   --   --   --  37.9*  --   --   PLT 309  --   --   --   --  267  --   --   LABPROT  --   --   --  12.5  --   --   --   --   INR  --   --   --  0.9  --   --   --   --   HEPARINUNFRC  --   --   --   --   --  0.21* >1.10* 0.11*  CREATININE 1.19  --   --   --   --  1.00  --   --   TROPONINIHS 23* 18* 25*  --  20*  --   --   --      Estimated Creatinine Clearance: 148.2 mL/min (by C-G formula based on SCr of 1 mg/dL).  Assessment: 50 yom with history of, DVT, PE, CAD s/p dLCx and s/p CABG (LIMA-->LAD, SVG-->PDA, OM1, D1),  PAD with stents to the R extremity with prior R and L toe amputations, chronic ulcer of both feet  great toe;  COPD with significant prior smoking history, HTN and HLD, Ischemic cardiomyopathy. Presenting with angina. Heparin per pharmacy started for ACS. male with chest pain for heparin. Patient was not on anticoagulation prior to arrival, as he stopped taking Xarelto last year.  Heparin level elevated this morning, now subtherapeutic. Trops negative, no WMAs on ECHO today.  Goal of Therapy:  Heparin level 0.3-0.7 units/ml Monitor platelets by anticoagulation protocol: Yes   Plan:  Increase heparin to 2100 units/h Repeat heparin level in 6h  08/18/21, PharmD, [place], [facility] Clinical  Pharmacist [phone] Please check AMION for all [facility] Pharmacy numbers 08/16/2021

## 2021-08-16 NOTE — Progress Notes (Addendum)
ANTICOAGULATION CONSULT NOTE Pharmacy Consult for Heparin Indication: chest pain/ACS  Allergies  Allergen Reactions   Sulfa Antibiotics Other (See Comments)    Headaches     Patient Measurements: Height: 6\' 5"  (195.6 cm) Weight: (!) 156.4 kg (344 lb 11.2 oz) IBW/kg (Calculated) : 89.1 Heparin Dosing Weight: 125 kg  Vital Signs: Temp: 97.6 F (36.4 C) (08/28 0940) Temp Source: Oral (08/28 0940) BP: 115/98 (08/28 0900) Pulse Rate: 71 (08/28 0900)  Labs: Recent Labs    08/15/21 1235 08/15/21 1520 08/15/21 1745 08/15/21 1802 08/15/21 2019 08/16/21 0312 08/16/21 0924  HGB 12.6*  --   --   --   --  12.4*  --   HCT 40.1  --   --   --   --  37.9*  --   PLT 309  --   --   --   --  267  --   LABPROT  --   --   --  12.5  --   --   --   INR  --   --   --  0.9  --   --   --   HEPARINUNFRC  --   --   --   --   --  0.21* >1.10*  CREATININE 1.19  --   --   --   --  1.00  --   TROPONINIHS 23* 18* 25*  --  20*  --   --      Estimated Creatinine Clearance: 148.2 mL/min (by C-G formula based on SCr of 1 mg/dL).  Assessment: 50 yom with history of, DVT, PE, CAD s/p dLCx and s/p CABG (LIMA-->LAD, SVG-->PDA, OM1, D1),  PAD with stents to the R extremity with prior R and L toe amputations, chronic ulcer of both feet  great toe;  COPD with significant prior smoking history, HTN and HLD, Ischemic cardiomyopathy. Presenting with angina. Heparin per pharmacy started for ACS. male with chest pain for heparin. Patient was not on anticoagulation prior to arrival, as he stopped taking Xarelto last year.  Most recent heparin level was 1.1. Verified that lab draw was in the opposite arm as heparin drip, evidenced by cotton ball still in place. Do not expect erroneous lab draw. Nursing was instructed to hold heparin infusion for 1 hour. It was held at 1130 and will resume at 1230 at a reduced rate. No signs or symptoms of bleeding reported by the patient or noticed upon examination.  H/H and platelets  are WNL.  Goal of Therapy:  Heparin level 0.3-0.7 units/ml Monitor platelets by anticoagulation protocol: Yes   Plan:  Hold heparin infusion for 1 hour (until 1230) Restart heparin at 1900 units/hour at 1230 Check heparin level 6 hours from restart. Monitor daily heparin level and CBC  Thank you for allowing pharmacy to participate in this patient's care.  08/18/21, PharmD PGY1 Pharmacy Resident 08/16/2021 11:49 AM Check [email or web address] for unit specific pharmacy number

## 2021-08-16 NOTE — Progress Notes (Signed)
ANTICOAGULATION CONSULT NOTE Pharmacy Consult for Heparin Indication: chest pain/ACS  Allergies  Allergen Reactions   Sulfa Antibiotics Other (See Comments)    Headaches     Patient Measurements: Height: 6\' 5"  (195.6 cm) Weight: (!) 155.4 kg (342 lb 9.6 oz) IBW/kg (Calculated) : 89.1 Heparin Dosing Weight: 124.9 kg  Vital Signs: Temp: 98.1 F (36.7 C) (08/27 2333) Temp Source: Oral (08/27 2333) BP: 118/85 (08/27 2333) Pulse Rate: 80 (08/27 2333)  Labs: Recent Labs    08/15/21 1235 08/15/21 1520 08/15/21 1745 08/15/21 1802 08/15/21 2019 08/16/21 0312  HGB 12.6*  --   --   --   --  12.4*  HCT 40.1  --   --   --   --  37.9*  PLT 309  --   --   --   --  267  LABPROT  --   --   --  12.5  --   --   INR  --   --   --  0.9  --   --   HEPARINUNFRC  --   --   --   --   --  0.21*  CREATININE 1.19  --   --   --   --   --   TROPONINIHS 23* 18* 25*  --  20*  --      Estimated Creatinine Clearance: 124.1 mL/min (by C-G formula based on SCr of 1.19 mg/dL).  Assessment: 50 y.o. male with chest pain for heparin   Goal of Therapy:  Heparin level 0.3-0.7 units/ml Monitor platelets by anticoagulation protocol: Yes   Plan:  Heparin 3000 units IV bolus, then increase heparin  2300 units/hr Check heparin level in 6 hours.  52, PharmD, BCPS

## 2021-08-17 ENCOUNTER — Encounter (HOSPITAL_COMMUNITY): Payer: Self-pay | Admitting: Internal Medicine

## 2021-08-17 ENCOUNTER — Encounter (HOSPITAL_COMMUNITY)
Admission: EM | Disposition: A | Discharge status: Home / Self Care | Payer: Self-pay | Source: Home / Self Care | Attending: Internal Medicine

## 2021-08-17 DIAGNOSIS — I2510 Atherosclerotic heart disease of native coronary artery without angina pectoris: Secondary | ICD-10-CM

## 2021-08-17 DIAGNOSIS — E785 Hyperlipidemia, unspecified: Secondary | ICD-10-CM | POA: Diagnosis not present

## 2021-08-17 DIAGNOSIS — I25110 Atherosclerotic heart disease of native coronary artery with unstable angina pectoris: Principal | ICD-10-CM

## 2021-08-17 HISTORY — PX: LEFT HEART CATH AND CORS/GRAFTS ANGIOGRAPHY: CATH118250

## 2021-08-17 LAB — CBC
HCT: 36.4 % — ABNORMAL LOW (ref 39.0–52.0)
Hemoglobin: 11.6 g/dL — ABNORMAL LOW (ref 13.0–17.0)
MCH: 26.6 pg (ref 26.0–34.0)
MCHC: 31.9 g/dL (ref 30.0–36.0)
MCV: 83.5 fL (ref 80.0–100.0)
Platelets: 281 10*3/uL (ref 150–400)
RBC: 4.36 MIL/uL (ref 4.22–5.81)
RDW: 14.8 % (ref 11.5–15.5)
WBC: 9.9 10*3/uL (ref 4.0–10.5)
nRBC: 0 % (ref 0.0–0.2)

## 2021-08-17 LAB — GLUCOSE, CAPILLARY
Glucose-Capillary: 223 mg/dL — ABNORMAL HIGH (ref 70–99)
Glucose-Capillary: 238 mg/dL — ABNORMAL HIGH (ref 70–99)
Glucose-Capillary: 276 mg/dL — ABNORMAL HIGH (ref 70–99)
Glucose-Capillary: 279 mg/dL — ABNORMAL HIGH (ref 70–99)

## 2021-08-17 LAB — HEPARIN LEVEL (UNFRACTIONATED): Heparin Unfractionated: 0.26 IU/mL — ABNORMAL LOW (ref 0.30–0.70)

## 2021-08-17 SURGERY — LEFT HEART CATH AND CORS/GRAFTS ANGIOGRAPHY
Anesthesia: LOCAL

## 2021-08-17 MED ORDER — LABETALOL HCL 5 MG/ML IV SOLN: 10.0000 mg | INTRAVENOUS | Status: AC | PRN

## 2021-08-17 MED ORDER — CARVEDILOL 6.25 MG PO TABS
6.2500 mg | ORAL_TABLET | Freq: Two times a day (BID) | ORAL | Status: DC
  Administered 2021-08-17 – 2021-08-19 (×4): 6.25 mg via ORAL
  Filled 2021-08-17 (×4): qty 1

## 2021-08-17 MED ORDER — MIDAZOLAM HCL 2 MG/2ML IJ SOLN
INTRAMUSCULAR | Status: AC
  Filled 2021-08-17: qty 2

## 2021-08-17 MED ORDER — HEPARIN (PORCINE) IN NACL 1000-0.9 UT/500ML-% IV SOLN
INTRAVENOUS | Status: AC
  Filled 2021-08-17: qty 1000

## 2021-08-17 MED ORDER — FUROSEMIDE 10 MG/ML IJ SOLN
40.0000 mg | Freq: Every day | INTRAMUSCULAR | Status: DC
  Administered 2021-08-18: 40 mg via INTRAVENOUS
  Filled 2021-08-17: qty 4

## 2021-08-17 MED ORDER — IOHEXOL 350 MG/ML SOLN
INTRAVENOUS | Status: DC | PRN
  Administered 2021-08-17: 160 mL

## 2021-08-17 MED ORDER — LIDOCAINE HCL (PF) 1 % IJ SOLN
INTRAMUSCULAR | Status: AC
  Filled 2021-08-17: qty 30

## 2021-08-17 MED ORDER — HEPARIN SODIUM (PORCINE) 1000 UNIT/ML IJ SOLN
INTRAMUSCULAR | Status: DC | PRN
  Administered 2021-08-17: 5000 [IU] via INTRAVENOUS

## 2021-08-17 MED ORDER — MIDAZOLAM HCL 2 MG/2ML IJ SOLN
INTRAMUSCULAR | Status: DC | PRN
  Administered 2021-08-17 (×2): 0.5 mg via INTRAVENOUS

## 2021-08-17 MED ORDER — VERAPAMIL HCL 2.5 MG/ML IV SOLN
INTRAVENOUS | Status: AC
  Filled 2021-08-17: qty 2

## 2021-08-17 MED ORDER — SODIUM CHLORIDE 0.9 % IV SOLN: 250.0000 mL | INTRAVENOUS | Status: DC | PRN

## 2021-08-17 MED ORDER — SODIUM CHLORIDE 0.9% FLUSH: 3.0000 mL | INTRAVENOUS | Status: DC | PRN

## 2021-08-17 MED ORDER — SODIUM CHLORIDE 0.9% FLUSH
3.0000 mL | Freq: Two times a day (BID) | INTRAVENOUS | Status: DC
  Administered 2021-08-17 – 2021-08-20 (×6): 3 mL via INTRAVENOUS

## 2021-08-17 MED ORDER — FENTANYL CITRATE (PF) 100 MCG/2ML IJ SOLN
INTRAMUSCULAR | Status: AC
  Filled 2021-08-17: qty 2

## 2021-08-17 MED ORDER — HYDRALAZINE HCL 20 MG/ML IJ SOLN: 10.0000 mg | INTRAMUSCULAR | Status: AC | PRN

## 2021-08-17 MED ORDER — FENTANYL CITRATE (PF) 100 MCG/2ML IJ SOLN
INTRAMUSCULAR | Status: DC | PRN
  Administered 2021-08-17 (×2): 12.5 ug via INTRAVENOUS

## 2021-08-17 MED ORDER — VERAPAMIL HCL 2.5 MG/ML IV SOLN
INTRAVENOUS | Status: DC | PRN
  Administered 2021-08-17: 10 mL via INTRA_ARTERIAL

## 2021-08-17 MED ORDER — LIDOCAINE HCL (PF) 1 % IJ SOLN
INTRAMUSCULAR | Status: DC | PRN
  Administered 2021-08-17: 2 mL

## 2021-08-17 MED ORDER — ISOSORBIDE MONONITRATE ER 30 MG PO TB24
30.0000 mg | ORAL_TABLET | Freq: Every day | ORAL | Status: DC
  Administered 2021-08-18: 30 mg via ORAL
  Filled 2021-08-17: qty 1

## 2021-08-17 MED ORDER — ENOXAPARIN SODIUM 40 MG/0.4ML IJ SOSY
40.0000 mg | PREFILLED_SYRINGE | INTRAMUSCULAR | Status: DC
  Administered 2021-08-18: 40 mg via SUBCUTANEOUS
  Filled 2021-08-17: qty 0.4

## 2021-08-17 MED ORDER — HEPARIN (PORCINE) IN NACL 1000-0.9 UT/500ML-% IV SOLN
INTRAVENOUS | Status: DC | PRN
  Administered 2021-08-17 (×2): 500 mL

## 2021-08-17 MED ORDER — HEPARIN SODIUM (PORCINE) 1000 UNIT/ML IJ SOLN
INTRAMUSCULAR | Status: AC
  Filled 2021-08-17: qty 1

## 2021-08-17 SURGICAL SUPPLY — 16 items
CATH INFINITI 5 FR IM (CATHETERS) ×1 IMPLANT
CATH INFINITI 5 FR JL3.5 (CATHETERS) ×1 IMPLANT
CATH INFINITI 5 FR MPA2 (CATHETERS) ×1 IMPLANT
CATH INFINITI 5FR MULTPACK ANG (CATHETERS) ×1 IMPLANT
CATH LAUNCHER 5F EBU3.0 (CATHETERS) IMPLANT
CATHETER LAUNCHER 5F EBU3.0 (CATHETERS) ×2
DEVICE RAD COMP TR BAND LRG (VASCULAR PRODUCTS) ×1 IMPLANT
GLIDESHEATH SLEND A-KIT 6F 22G (SHEATH) ×1 IMPLANT
GUIDEWIRE INQWIRE 1.5J.035X260 (WIRE) IMPLANT
INQWIRE 1.5J .035X260CM (WIRE) ×2
KIT HEART LEFT (KITS) ×2 IMPLANT
MAT PREVALON FULL STRYKER (MISCELLANEOUS) ×1 IMPLANT
PACK CARDIAC CATHETERIZATION (CUSTOM PROCEDURE TRAY) ×2 IMPLANT
SHEATH PROBE COVER 6X72 (BAG) ×1 IMPLANT
TRANSDUCER W/STOPCOCK (MISCELLANEOUS) ×2 IMPLANT
TUBING CIL FLEX 10 FLL-RA (TUBING) ×2 IMPLANT

## 2021-08-17 NOTE — Progress Notes (Signed)
ANTICOAGULATION CONSULT NOTE Pharmacy Consult for Heparin Indication: chest pain/ACS  Allergies  Allergen Reactions   Sulfa Antibiotics Other (See Comments)    Headaches     Patient Measurements: Height: 6\' 5"  (195.6 cm) Weight: (!) 159.2 kg (351 lb) IBW/kg (Calculated) : 89.1 Heparin Dosing Weight: 125 kg  Vital Signs: Temp: 97.5 F (36.4 C) (08/29 0418) Temp Source: Oral (08/29 0000) BP: 117/87 (08/29 0418) Pulse Rate: 72 (08/29 0418)  Labs: Recent Labs    08/15/21 1235 08/15/21 1235 08/15/21 1520 08/15/21 1745 08/15/21 1802 08/15/21 2019 08/16/21 0312 08/16/21 0924 08/16/21 1841 08/17/21 0315  HGB 12.6*  --   --   --   --   --  12.4*  --   --  11.6*  HCT 40.1  --   --   --   --   --  37.9*  --   --  36.4*  PLT 309  --   --   --   --   --  267  --   --  281  LABPROT  --   --   --   --  12.5  --   --   --   --   --   INR  --   --   --   --  0.9  --   --   --   --   --   HEPARINUNFRC  --    < >  --   --   --   --  0.21* >1.10* 0.11* 0.26*  CREATININE 1.19  --   --   --   --   --  1.00  --   --   --   TROPONINIHS 23*  --  18* 25*  --  20*  --   --   --   --    < > = values in this interval not displayed.     Estimated Creatinine Clearance: 149.6 mL/min (by C-G formula based on SCr of 1 mg/dL).  Assessment: 50 yom with history of, DVT, PE, CAD s/p dLCx and s/p CABG (LIMA-->LAD, SVG-->PDA, OM1, D1),  PAD with stents to the R extremity with prior R and L toe amputations, chronic ulcer of both feet  great toe;  COPD with significant prior smoking history, HTN and HLD, Ischemic cardiomyopathy. Presenting with angina. Heparin per pharmacy started for ACS. male with chest pain for heparin. Patient was not on anticoagulation prior to arrival, as he stopped taking Xarelto last year.  Heparin level elevated this morning, now subtherapeutic. Trops negative, no WMAs on ECHO today.  8/29 AM update:  Heparin level sub-therapeutic  HL of >1.1 from  yesterday was likely an  error  Goal of Therapy:  Heparin level 0.3-0.7 units/ml Monitor platelets by anticoagulation protocol: Yes   Plan:  Inc heparin to 2250 units/hr 1300 heparin level  9/29, PharmD, BCPS Clinical Pharmacist Phone: [phone]

## 2021-08-17 NOTE — Plan of Care (Signed)

## 2021-08-17 NOTE — Progress Notes (Signed)
Progress Note  Patient Name: Joseph [name] Date of Encounter: 08/17/2021  [facility] Cardiologist: Chilton Si, MD   Subjective   Pt denies CP   Breathing is OK    VERY sleepy initially but then woke up    Per nursing CPAP kept going off last night with sats in 70s  Pt says he did not hyave his usual chin strap.    Inpatient Medications    Scheduled Meds:  ALPRAZolam  1 mg Oral QHS   aspirin EC  81 mg Oral Daily   buPROPion  150 mg Oral BID   carvedilol  3.125 mg Oral BID   Chlorhexidine Gluconate Cloth  6 each Topical Q0600   clopidogrel  75 mg Oral Q breakfast   escitalopram  10 mg Oral q AM   fenofibrate  160 mg Oral Daily   insulin aspart  0-15 Units Subcutaneous TID WC   insulin aspart  0-5 Units Subcutaneous QHS   insulin aspart  6 Units Subcutaneous TID WC   insulin glargine-yfgn  60 Units Subcutaneous BID   mupirocin ointment  1 application Nasal BID   pantoprazole  40 mg Oral Daily   pregabalin  150 mg Oral QHS   pregabalin  300 mg Oral Daily   rosuvastatin  40 mg Oral QPM   sodium chloride flush  3 mL Intravenous Q12H   Continuous Infusions:  sodium chloride     sodium chloride 1 mL/kg/hr (08/17/21 0513)   heparin 2,250 Units/hr (08/17/21 0512)   nitroGLYCERIN 5 mcg/min (08/16/21 1939)   PRN Meds: sodium chloride, acetaminophen, albuterol, nitroGLYCERIN, ondansetron (ZOFRAN) IV, oxyCODONE, sodium chloride flush   Vital Signs    Vitals:   08/16/21 1607 08/16/21 2100 08/17/21 0000 08/17/21 0418  BP: 90/72 131/85 112/82 117/87  Pulse: 79 77 82 72  Resp: 16 12 11 14   Temp: 98.2 F (36.8 C) 98.3 F (36.8 C) 98.3 F (36.8 C) (!) 97.5 F (36.4 C)  TempSrc: Oral Oral Oral   SpO2: 97% 96% 93% 95%  Weight:    (!) 159.2 kg  Height:        Intake/Output Summary (Last 24 hours) at 08/17/2021 0827 Last data filed at 08/17/2021 08/19/2021 Gross per 24 hour  Intake 2279.43 ml  Output 300 ml  Net 1979.43 ml   Last 3 Weights 08/17/2021 08/16/2021  08/15/2021  Weight (lbs) 351 lb 344 lb 11.2 oz 342 lb 9.6 oz  Weight (kg) 159.213 kg 156.355 kg 155.402 kg      Telemetry    SR with PVCs- Personally Reviewed  ECG    N/a - Personally Reviewed  Physical Exam   GEN: Obese 50 yo in no acute distress.   Neck: No JVD Cardiac: RRR, no murmurs Respiratory: Clear to auscultation bilaterally. GI: Soft, nontender, non-distended  MS: No edema; No deformity. Neuro:  Nonfocal  Psych: Normal affect   Labs    High Sensitivity Troponin:   Recent Labs  Lab 08/15/21 1235 08/15/21 1520 08/15/21 1745 08/15/21 2019  TROPONINIHS 23* 18* 25* 20*      Chemistry Recent Labs  Lab 08/15/21 1235 08/16/21 0312  NA 134* 134*  K 4.0 3.6  CL 102 103  CO2 23 23  GLUCOSE 410* 304*  BUN 20 20  CREATININE 1.19 1.00  CALCIUM 9.1 8.8*  GFRNONAA >60 >60  ANIONGAP 9 8     Hematology Recent Labs  Lab 08/15/21 1235 08/16/21 0312 08/17/21 0315  WBC 8.7 9.1 9.9  RBC  4.78 4.63 4.36  HGB 12.6* 12.4* 11.6*  HCT 40.1 37.9* 36.4*  MCV 83.9 81.9 83.5  MCH 26.4 26.8 26.6  MCHC 31.4 32.7 31.9  RDW 14.2 14.5 14.8  PLT 309 267 281    BNPNo results for input(s): BNP, PROBNP in the last 168 hours.   DDimer No results for input(s): DDIMER in the last 168 hours.   Radiology    DG Chest 2 View  Result Date: 08/15/2021 CLINICAL DATA:  history of asthma/COPD, PE on anticoagulation, DM, HTN, and ACS s/p CABG presents to the emergency department with chest pain. has been ongoing since midnight. He states intermittently since he was hospitalized for chest pain 1 month ago. EXAM: CHEST - 2 VIEW COMPARISON:  07/16/2021 and previous FINDINGS: Relatively low lung volumes with prominent interstitial markings at the left lung base as before. Heart size and mediastinal contours are within normal limits. CABG markers. No effusion. Sternotomy wires. Mild wedge deformity in lower thoracic vertebral bodies as before. IMPRESSION: No acute disease post CABG.  Electronically Signed   By: Corlis Leak M.D.   On: 08/15/2021 13:02   ECHOCARDIOGRAM COMPLETE  Result Date: 08/16/2021    ECHOCARDIOGRAM REPORT   Patient Name:   Joseph [name] [facility] Date of Exam: 08/16/2021 Medical Rec #:  [id number]       Height:       77.0 in Accession #:    [id number]      Weight:       344.7 lb Date of Birth:  09/15/1971       BSA:          2.819 m Patient Age:    50 years        BP:           99/80 mmHg Patient Gender: M               HR:           82 bpm. Exam Location:  Inpatient Procedure: 2D Echo, Cardiac Doppler, Color Doppler and Intracardiac            Opacification Agent Indications:    R07.9* Chest pain, unspecified  History:        Patient has prior history of Echocardiogram examinations, most                 recent 01/13/2021. CAD and Previous Myocardial Infarction, COPD;                 Risk Factors:Dyslipidemia, Hypertension and Diabetes.  Sonographer:    Eulah Pont RDCS Referring Phys: [id number] Manson Passey IMPRESSIONS  1. Left ventricular ejection fraction, by estimation, is 55%. The left ventricle has normal function. The left ventricle has no regional wall motion abnormalities. There is mild concentric left ventricular hypertrophy. Left ventricular diastolic parameters are consistent with Grade I diastolic dysfunction (impaired relaxation).  2. Right ventricular systolic function is low normal. The right ventricular size is normal.  3. The mitral valve is normal in structure. No evidence of mitral valve regurgitation.  4. The aortic valve is abnormal. There is mild calcification of the aortic valve. There is mild thickening of the aortic valve. Aortic valve regurgitation is not visualized.  5. Aortic dilatation noted. There is mild dilatation of the aortic root, measuring 42 mm. There is mild dilatation of the ascending aorta, measuring 43 mm. Comparison(s): A prior study was performed on 07/24/2020. Reviewed prior studies in series: Aortic Valve is consistent with a  Bicuspid  Aortic Valve (Sievers's Class 1 R-L fusion). FINDINGS  Left Ventricle: Left ventricular ejection fraction, by estimation, is 55%. The left ventricle has normal function. The left ventricle has no regional wall motion abnormalities. Definity contrast agent was given IV to delineate the left ventricular endocardial borders. The left ventricular internal cavity size was normal in size. There is mild concentric left ventricular hypertrophy. Left ventricular diastolic parameters are consistent with Grade I diastolic dysfunction (impaired relaxation). Right Ventricle: The right ventricular size is normal. No increase in right ventricular wall thickness. Right ventricular systolic function is low normal. Left Atrium: Left atrial size was normal in size. Right Atrium: Right atrial size was normal in size. Pericardium: There is no evidence of pericardial effusion. Mitral Valve: The mitral valve is normal in structure. No evidence of mitral valve regurgitation. Tricuspid Valve: The tricuspid valve is normal in structure. Tricuspid valve regurgitation is not demonstrated. Aortic Valve: The aortic valve is abnormal. There is mild calcification of the aortic valve. There is mild thickening of the aortic valve. There is mild aortic valve annular calcification. Aortic valve regurgitation is not visualized. Pulmonic Valve: The pulmonic valve was not well visualized. Pulmonic valve regurgitation is not visualized. No evidence of pulmonic stenosis. Aorta: Aortic dilatation noted. There is mild dilatation of the aortic root, measuring 42 mm. There is mild dilatation of the ascending aorta, measuring 43 mm. IAS/Shunts: The atrial septum is grossly normal.  LEFT VENTRICLE PLAX 2D LVIDd:         4.90 cm  Diastology LVIDs:         3.20 cm  LV e' medial:    7.28 cm/s LV PW:         1.30 cm  LV E/e' medial:  9.1 LV IVS:        1.10 cm  LV e' lateral:   6.73 cm/s LVOT diam:     2.20 cm  LV E/e' lateral: 9.9 LV SV:         62 LV SV  Index:   22 LVOT Area:     3.80 cm  RIGHT VENTRICLE RV S prime:     7.43 cm/s TAPSE (M-mode): 1.4 cm LEFT ATRIUM             Index       RIGHT ATRIUM          Index LA diam:        3.40 cm 1.21 cm/m  RA Area:     9.60 cm LA Vol (A2C):   44.2 ml 15.68 ml/m RA Volume:   14.50 ml 5.14 ml/m LA Vol (A4C):   52.7 ml 18.69 ml/m LA Biplane Vol: 49.1 ml 17.42 ml/m  AORTIC VALVE LVOT Vmax:   84.43 cm/s LVOT Vmean:  56.667 cm/s LVOT VTI:    0.164 m  AORTA Ao Root diam: 4.20 cm Ao Asc diam:  4.30 cm MITRAL VALVE MV Area (PHT): 2.45 cm    SHUNTS MV Decel Time: 310 msec    Systemic VTI:  0.16 m MV E velocity: 66.50 cm/s  Systemic Diam: 2.20 cm MV A velocity: 60.30 cm/s MV E/A ratio:  1.10 Riley Lam MD Electronically signed by Riley Lam MD Signature Date/Time: 08/16/2021/12:20:45 PM    Final     Cardiac Studies   Echo    08/15/21  1. Left ventricular ejection fraction, by estimation, is 55%. The left  ventricle has normal function. The left ventricle has no regional wall  motion abnormalities. There is mild  concentric left ventricular  hypertrophy. Left ventricular diastolic  parameters are consistent with Grade I diastolic dysfunction (impaired  relaxation).   2. Right ventricular systolic function is low normal. The right  ventricular size is normal.   3. The mitral valve is normal in structure. No evidence of mitral valve  regurgitation.   4. The aortic valve is abnormal. There is mild calcification of the  aortic valve. There is mild thickening of the aortic valve. Aortic valve  regurgitation is not visualized.   5. Aortic dilatation noted. There is mild dilatation of the aortic root,  measuring 42 mm. There is mild dilatation of the ascending aorta,  measuring 43 mm.   Comparison(s): A prior study was performed on 07/24/2020. Reviewed prior  studies in series: Aortic Valve is consistent with a Bicuspid Aortic Valve  (Sievers's Class 1 R-L fusion).   Patient Profile      Joseph [name] is a 50 y.o. male with CAD s/p dLCx and s/p CABG (LIMA-->LAD, SVG-->PDA, OM1, D1),  PAD with stents to the R extremity with prior R and L toe amputations, chronic ulcer of both feet  great toe;  COPD with significant prior smoking history, HTN and HLD, Ischemic cardiomyopathy f unclear status Diabetes with diabetes neuropathy, prior hx of Morbid Obesity, OSA unclear if on CPAP DVT and PE on xarelto who presented wit angina.  Assessment & Plan    CAD/Chest pain - history of CAD with CABG in 2021, prior pci to LCX in 2017 - presents with chest pain concerning for unstable angina. Frivial elevation of troponin, EKG no acute ischemic changes\ Given Hx plan for Lheart cath         2   PAD  stents to R leg.  Prior R and L t - prior interventions  3.   AV    Pt with bicuspid AV   No significant stenosis    Note mild dilation of the asc aorta  (43 mm )  This will need to be followed    4  Hyperlipidemia - LDL 95,  Pt switched to  crestor 40mg daily. (From LIpitor) for tighter control   WIll need follow up lipids after d/c    5  OSA   Continue CPAP   Get straps   For questions or updates, please contact [facility] Please consult [email or web address] for contact info under        Signed, Sheniya Garciaperez, MD  08/17/2021, 8:27 AM    

## 2021-08-17 NOTE — H&P (View-Only) (Signed)
Progress Note  Patient Name: Joseph [name] Date of Encounter: 08/17/2021  [facility] Cardiologist: Chilton Si, MD   Subjective   Pt denies CP   Breathing is OK    VERY sleepy initially but then woke up    Per nursing CPAP kept going off last night with sats in 70s  Pt says he did not hyave his usual chin strap.    Inpatient Medications    Scheduled Meds:  ALPRAZolam  1 mg Oral QHS   aspirin EC  81 mg Oral Daily   buPROPion  150 mg Oral BID   carvedilol  3.125 mg Oral BID   Chlorhexidine Gluconate Cloth  6 each Topical Q0600   clopidogrel  75 mg Oral Q breakfast   escitalopram  10 mg Oral q AM   fenofibrate  160 mg Oral Daily   insulin aspart  0-15 Units Subcutaneous TID WC   insulin aspart  0-5 Units Subcutaneous QHS   insulin aspart  6 Units Subcutaneous TID WC   insulin glargine-yfgn  60 Units Subcutaneous BID   mupirocin ointment  1 application Nasal BID   pantoprazole  40 mg Oral Daily   pregabalin  150 mg Oral QHS   pregabalin  300 mg Oral Daily   rosuvastatin  40 mg Oral QPM   sodium chloride flush  3 mL Intravenous Q12H   Continuous Infusions:  sodium chloride     sodium chloride 1 mL/kg/hr (08/17/21 0513)   heparin 2,250 Units/hr (08/17/21 0512)   nitroGLYCERIN 5 mcg/min (08/16/21 1939)   PRN Meds: sodium chloride, acetaminophen, albuterol, nitroGLYCERIN, ondansetron (ZOFRAN) IV, oxyCODONE, sodium chloride flush   Vital Signs    Vitals:   08/16/21 1607 08/16/21 2100 08/17/21 0000 08/17/21 0418  BP: 90/72 131/85 112/82 117/87  Pulse: 79 77 82 72  Resp: 16 12 11 14   Temp: 98.2 F (36.8 C) 98.3 F (36.8 C) 98.3 F (36.8 C) (!) 97.5 F (36.4 C)  TempSrc: Oral Oral Oral   SpO2: 97% 96% 93% 95%  Weight:    (!) 159.2 kg  Height:        Intake/Output Summary (Last 24 hours) at 08/17/2021 0827 Last data filed at 08/17/2021 08/19/2021 Gross per 24 hour  Intake 2279.43 ml  Output 300 ml  Net 1979.43 ml   Last 3 Weights 08/17/2021 08/16/2021  08/15/2021  Weight (lbs) 351 lb 344 lb 11.2 oz 342 lb 9.6 oz  Weight (kg) 159.213 kg 156.355 kg 155.402 kg      Telemetry    SR with PVCs- Personally Reviewed  ECG    N/a - Personally Reviewed  Physical Exam   GEN: Obese 50 yo in no acute distress.   Neck: No JVD Cardiac: RRR, no murmurs Respiratory: Clear to auscultation bilaterally. GI: Soft, nontender, non-distended  MS: No edema; No deformity. Neuro:  Nonfocal  Psych: Normal affect   Labs    High Sensitivity Troponin:   Recent Labs  Lab 08/15/21 1235 08/15/21 1520 08/15/21 1745 08/15/21 2019  TROPONINIHS 23* 18* 25* 20*      Chemistry Recent Labs  Lab 08/15/21 1235 08/16/21 0312  NA 134* 134*  K 4.0 3.6  CL 102 103  CO2 23 23  GLUCOSE 410* 304*  BUN 20 20  CREATININE 1.19 1.00  CALCIUM 9.1 8.8*  GFRNONAA >60 >60  ANIONGAP 9 8     Hematology Recent Labs  Lab 08/15/21 1235 08/16/21 0312 08/17/21 0315  WBC 8.7 9.1 9.9  RBC  4.78 4.63 4.36  HGB 12.6* 12.4* 11.6*  HCT 40.1 37.9* 36.4*  MCV 83.9 81.9 83.5  MCH 26.4 26.8 26.6  MCHC 31.4 32.7 31.9  RDW 14.2 14.5 14.8  PLT 309 267 281    BNPNo results for input(s): BNP, PROBNP in the last 168 hours.   DDimer No results for input(s): DDIMER in the last 168 hours.   Radiology    DG Chest 2 View  Result Date: 08/15/2021 CLINICAL DATA:  history of asthma/COPD, PE on anticoagulation, DM, HTN, and ACS s/p CABG presents to the emergency department with chest pain. has been ongoing since midnight. He states intermittently since he was hospitalized for chest pain 1 month ago. EXAM: CHEST - 2 VIEW COMPARISON:  07/16/2021 and previous FINDINGS: Relatively low lung volumes with prominent interstitial markings at the left lung base as before. Heart size and mediastinal contours are within normal limits. CABG markers. No effusion. Sternotomy wires. Mild wedge deformity in lower thoracic vertebral bodies as before. IMPRESSION: No acute disease post CABG.  Electronically Signed   By: Corlis Leak M.D.   On: 08/15/2021 13:02   ECHOCARDIOGRAM COMPLETE  Result Date: 08/16/2021    ECHOCARDIOGRAM REPORT   Patient Name:   Joseph [name] [facility] Date of Exam: 08/16/2021 Medical Rec #:  [id number]       Height:       77.0 in Accession #:    [id number]      Weight:       344.7 lb Date of Birth:  09/15/1971       BSA:          2.819 m Patient Age:    50 years        BP:           99/80 mmHg Patient Gender: M               HR:           82 bpm. Exam Location:  Inpatient Procedure: 2D Echo, Cardiac Doppler, Color Doppler and Intracardiac            Opacification Agent Indications:    R07.9* Chest pain, unspecified  History:        Patient has prior history of Echocardiogram examinations, most                 recent 01/13/2021. CAD and Previous Myocardial Infarction, COPD;                 Risk Factors:Dyslipidemia, Hypertension and Diabetes.  Sonographer:    Eulah Pont RDCS Referring Phys: [id number] Manson Passey IMPRESSIONS  1. Left ventricular ejection fraction, by estimation, is 55%. The left ventricle has normal function. The left ventricle has no regional wall motion abnormalities. There is mild concentric left ventricular hypertrophy. Left ventricular diastolic parameters are consistent with Grade I diastolic dysfunction (impaired relaxation).  2. Right ventricular systolic function is low normal. The right ventricular size is normal.  3. The mitral valve is normal in structure. No evidence of mitral valve regurgitation.  4. The aortic valve is abnormal. There is mild calcification of the aortic valve. There is mild thickening of the aortic valve. Aortic valve regurgitation is not visualized.  5. Aortic dilatation noted. There is mild dilatation of the aortic root, measuring 42 mm. There is mild dilatation of the ascending aorta, measuring 43 mm. Comparison(s): A prior study was performed on 07/24/2020. Reviewed prior studies in series: Aortic Valve is consistent with a  Bicuspid  Aortic Valve (Sievers's Class 1 R-L fusion). FINDINGS  Left Ventricle: Left ventricular ejection fraction, by estimation, is 55%. The left ventricle has normal function. The left ventricle has no regional wall motion abnormalities. Definity contrast agent was given IV to delineate the left ventricular endocardial borders. The left ventricular internal cavity size was normal in size. There is mild concentric left ventricular hypertrophy. Left ventricular diastolic parameters are consistent with Grade I diastolic dysfunction (impaired relaxation). Right Ventricle: The right ventricular size is normal. No increase in right ventricular wall thickness. Right ventricular systolic function is low normal. Left Atrium: Left atrial size was normal in size. Right Atrium: Right atrial size was normal in size. Pericardium: There is no evidence of pericardial effusion. Mitral Valve: The mitral valve is normal in structure. No evidence of mitral valve regurgitation. Tricuspid Valve: The tricuspid valve is normal in structure. Tricuspid valve regurgitation is not demonstrated. Aortic Valve: The aortic valve is abnormal. There is mild calcification of the aortic valve. There is mild thickening of the aortic valve. There is mild aortic valve annular calcification. Aortic valve regurgitation is not visualized. Pulmonic Valve: The pulmonic valve was not well visualized. Pulmonic valve regurgitation is not visualized. No evidence of pulmonic stenosis. Aorta: Aortic dilatation noted. There is mild dilatation of the aortic root, measuring 42 mm. There is mild dilatation of the ascending aorta, measuring 43 mm. IAS/Shunts: The atrial septum is grossly normal.  LEFT VENTRICLE PLAX 2D LVIDd:         4.90 cm  Diastology LVIDs:         3.20 cm  LV e' medial:    7.28 cm/s LV PW:         1.30 cm  LV E/e' medial:  9.1 LV IVS:        1.10 cm  LV e' lateral:   6.73 cm/s LVOT diam:     2.20 cm  LV E/e' lateral: 9.9 LV SV:         62 LV SV  Index:   22 LVOT Area:     3.80 cm  RIGHT VENTRICLE RV S prime:     7.43 cm/s TAPSE (M-mode): 1.4 cm LEFT ATRIUM             Index       RIGHT ATRIUM          Index LA diam:        3.40 cm 1.21 cm/m  RA Area:     9.60 cm LA Vol (A2C):   44.2 ml 15.68 ml/m RA Volume:   14.50 ml 5.14 ml/m LA Vol (A4C):   52.7 ml 18.69 ml/m LA Biplane Vol: 49.1 ml 17.42 ml/m  AORTIC VALVE LVOT Vmax:   84.43 cm/s LVOT Vmean:  56.667 cm/s LVOT VTI:    0.164 m  AORTA Ao Root diam: 4.20 cm Ao Asc diam:  4.30 cm MITRAL VALVE MV Area (PHT): 2.45 cm    SHUNTS MV Decel Time: 310 msec    Systemic VTI:  0.16 m MV E velocity: 66.50 cm/s  Systemic Diam: 2.20 cm MV A velocity: 60.30 cm/s MV E/A ratio:  1.10 Riley Lam MD Electronically signed by Riley Lam MD Signature Date/Time: 08/16/2021/12:20:45 PM    Final     Cardiac Studies   Echo    08/15/21  1. Left ventricular ejection fraction, by estimation, is 55%. The left  ventricle has normal function. The left ventricle has no regional wall  motion abnormalities. There is mild  concentric left ventricular  hypertrophy. Left ventricular diastolic  parameters are consistent with Grade I diastolic dysfunction (impaired  relaxation).   2. Right ventricular systolic function is low normal. The right  ventricular size is normal.   3. The mitral valve is normal in structure. No evidence of mitral valve  regurgitation.   4. The aortic valve is abnormal. There is mild calcification of the  aortic valve. There is mild thickening of the aortic valve. Aortic valve  regurgitation is not visualized.   5. Aortic dilatation noted. There is mild dilatation of the aortic root,  measuring 42 mm. There is mild dilatation of the ascending aorta,  measuring 43 mm.   Comparison(s): A prior study was performed on 07/24/2020. Reviewed prior  studies in series: Aortic Valve is consistent with a Bicuspid Aortic Valve  (Sievers's Class 1 R-L fusion).   Patient Profile      Gwynneth Albrighterry W Beckum is a 50 y.o. male with CAD s/p dLCx and s/p CABG (LIMA-->LAD, SVG-->PDA, OM1, D1),  PAD with stents to the R extremity with prior R and L toe amputations, chronic ulcer of both feet  great toe;  COPD with significant prior smoking history, HTN and HLD, Ischemic cardiomyopathy f unclear status Diabetes with diabetes neuropathy, prior hx of Morbid Obesity, OSA unclear if on CPAP DVT and PE on xarelto who presented wit angina.  Assessment & Plan    CAD/Chest pain - history of CAD with CABG in 2021, prior pci to LCX in 2017 - presents with chest pain concerning for unstable angina. Frivial elevation of troponin, EKG no acute ischemic changes\ Given Hx plan for Lheart cath         2   PAD  stents to R leg.  Prior R and L t - prior interventions  3.   AV    Pt with bicuspid AV   No significant stenosis    Note mild dilation of the asc aorta  (43 mm )  This will need to be followed    4  Hyperlipidemia - LDL 95,  Pt switched to  crestor 40mg  daily. (From LIpitor) for tighter control   WIll need follow up lipids after d/c    5  OSA   Continue CPAP   Get straps   For questions or updates, please contact [facility] Please consult [email or web address] for contact info under        Signed, Dietrich PatesPaula Noralee Dutko, MD  08/17/2021, 8:27 AM

## 2021-08-17 NOTE — Progress Notes (Signed)
2cc of air removed from tr band on left radial. Site wdl

## 2021-08-17 NOTE — Interval H&P Note (Signed)
History and Physical Interval Note:  08/17/2021 9:08 AM  Joseph [name]  has presented today for surgery, with the diagnosis of unstable angina.  The various methods of treatment have been discussed with the patient and family. After consideration of risks, benefits and other options for treatment, the patient has consented to  Procedure(s): LEFT HEART CATH AND CORS/GRAFTS ANGIOGRAPHY (N/A) as a surgical intervention.  The patient's history has been reviewed, patient examined, no change in status, stable for surgery.  I have reviewed the patient's chart and labs.  Questions were answered to the patient's satisfaction.    Cath Lab Visit (complete for each Cath Lab visit)  Clinical Evaluation Leading to the Procedure:   ACS: Yes.    Non-ACS:  N/A  Joseph [name]

## 2021-08-17 NOTE — Progress Notes (Signed)
Inpatient Diabetes Program Recommendations  AACE/ADA: New Consensus Statement on Inpatient Glycemic Control   Target Ranges:  Prepandial:   less than 140 mg/dL      Peak postprandial:   less than 180 mg/dL (1-2 hours)      Critically ill patients:  140 - 180 mg/dL   Results for Joseph [name], Joseph [name] (MRN [id number]) as of 08/17/2021 10:49  Ref. Range 08/16/2021 06:12 08/16/2021 11:43 08/16/2021 15:44 08/16/2021 21:05 08/17/2021 06:07  Glucose-Capillary Latest Ref Range: 70 - 99 mg/dL 629 (H) 476 (H) 546 (H) 222 (H) 223 (H)   Review of Glycemic Control  Diabetes history: DM2 Outpatient Diabetes medications: Levemir 60 units BID, Novolog 12 units TID, Metformin 1000 mg BID, Victoza 1.8 mg daily Current orders for Inpatient glycemic control: Semglee 60 units BID, Novolog 0-15 units TID with meals, Novolog 0-5 units QHS, Novolog 6 units TID with meals  Inpatient Diabetes Program Recommendations:    Insulin: Please consider increasing Semglee to 65 units BID and meal coverage to Novolog 10 units TID with meals if patient eats at least 50% of meals.  Thanks, Orlando Penner, RN, MSN, CDE Diabetes Coordinator Inpatient Diabetes Program [phone] (Team Pager from 8am to 5pm)

## 2021-08-18 DIAGNOSIS — I712 Thoracic aortic aneurysm, without rupture: Secondary | ICD-10-CM | POA: Diagnosis not present

## 2021-08-18 DIAGNOSIS — I739 Peripheral vascular disease, unspecified: Secondary | ICD-10-CM | POA: Diagnosis not present

## 2021-08-18 DIAGNOSIS — Q231 Congenital insufficiency of aortic valve: Secondary | ICD-10-CM | POA: Diagnosis not present

## 2021-08-18 DIAGNOSIS — I200 Unstable angina: Secondary | ICD-10-CM | POA: Diagnosis not present

## 2021-08-18 LAB — GLUCOSE, CAPILLARY
Glucose-Capillary: 263 mg/dL — ABNORMAL HIGH (ref 70–99)
Glucose-Capillary: 248 mg/dL — ABNORMAL HIGH (ref 70–99)
Glucose-Capillary: 176 mg/dL — ABNORMAL HIGH (ref 70–99)
Glucose-Capillary: 271 mg/dL — ABNORMAL HIGH (ref 70–99)

## 2021-08-18 MED ORDER — INSULIN GLARGINE-YFGN 100 UNIT/ML ~~LOC~~ SOLN
65.0000 [IU] | Freq: Two times a day (BID) | SUBCUTANEOUS | Status: DC
  Administered 2021-08-18 – 2021-08-20 (×4): 65 [IU] via SUBCUTANEOUS
  Filled 2021-08-18 (×5): qty 0.65

## 2021-08-18 MED ORDER — ISOSORBIDE MONONITRATE ER 60 MG PO TB24
60.0000 mg | ORAL_TABLET | Freq: Every day | ORAL | Status: DC
  Administered 2021-08-19: 60 mg via ORAL
  Filled 2021-08-18 (×2): qty 1

## 2021-08-18 MED ORDER — ISOSORBIDE MONONITRATE ER 30 MG PO TB24
30.0000 mg | ORAL_TABLET | Freq: Once | ORAL | Status: AC
  Administered 2021-08-18: 30 mg via ORAL
  Filled 2021-08-18: qty 1

## 2021-08-18 MED ORDER — INSULIN ASPART 100 UNIT/ML IJ SOLN
10.0000 [IU] | Freq: Three times a day (TID) | INTRAMUSCULAR | Status: DC
  Administered 2021-08-18 – 2021-08-20 (×6): 10 [IU] via SUBCUTANEOUS

## 2021-08-18 MED ORDER — FUROSEMIDE 40 MG PO TABS
40.0000 mg | ORAL_TABLET | Freq: Every day | ORAL | Status: DC
  Administered 2021-08-19: 40 mg via ORAL
  Filled 2021-08-18: qty 1

## 2021-08-18 NOTE — Progress Notes (Signed)
Patient will self place CPAP when ready, patient informed to have RN call RT should he require assistance.

## 2021-08-18 NOTE — Progress Notes (Signed)
Pt stated he could place self on cpap unit when ready for bed. 

## 2021-08-18 NOTE — Progress Notes (Addendum)
Progress Note  Patient Name: Joseph [name] Date of Encounter: 08/18/2021  Primary Cardiologist: Chilton Si, MD   Subjective   Patient notes that he feels better but still have some residual chest pressure.  Breathing is improved  Inpatient Medications    Scheduled Meds:  ALPRAZolam  1 mg Oral QHS   aspirin EC  81 mg Oral Daily   buPROPion  150 mg Oral BID   carvedilol  6.25 mg Oral BID   Chlorhexidine Gluconate Cloth  6 each Topical Q0600   clopidogrel  75 mg Oral Q breakfast   enoxaparin (LOVENOX) injection  40 mg Subcutaneous Q24H   escitalopram  10 mg Oral q AM   fenofibrate  160 mg Oral Daily   furosemide  40 mg Intravenous Daily   insulin aspart  0-15 Units Subcutaneous TID WC   insulin aspart  0-5 Units Subcutaneous QHS   insulin aspart  6 Units Subcutaneous TID WC   insulin glargine-yfgn  60 Units Subcutaneous BID   isosorbide mononitrate  30 mg Oral Daily   mupirocin ointment  1 application Nasal BID   pantoprazole  40 mg Oral Daily   pregabalin  150 mg Oral QHS   pregabalin  300 mg Oral Daily   rosuvastatin  40 mg Oral QPM   sodium chloride flush  3 mL Intravenous Q12H   sodium chloride flush  3 mL Intravenous Q12H   Continuous Infusions:  sodium chloride     PRN Meds: sodium chloride, acetaminophen, albuterol, nitroGLYCERIN, ondansetron (ZOFRAN) IV, oxyCODONE, sodium chloride flush   Vital Signs    Vitals:   08/17/21 2357 08/17/21 2358 08/18/21 0000 08/18/21 0600  BP:   99/79 123/80  Pulse: 88 86    Resp: 18 13 (!) 29 17  Temp:    98.2 F (36.8 C)  TempSrc:    Oral  SpO2: 95% 96%  95%  Weight:      Height:        Intake/Output Summary (Last 24 hours) at 08/18/2021 1017 Last data filed at 08/18/2021 0500 Gross per 24 hour  Intake 480 ml  Output 800 ml  Net -320 ml   Filed Weights   08/15/21 2014 08/16/21 0500 08/17/21 0418  Weight: (!) 155.4 kg (!) 156.4 kg (!) 159.2 kg    Telemetry    SR - Personally Reviewed  ECG    Sinus  Tach 112 RBBB and LAFB with anterolateral infarct - Personally Reviewed  Physical Exam   GEN: No acute distress.   Neck: No JVD Cardiac: RRR, no murmurs, rubs, or gallops. No hematoma at cath site Respiratory: Clear to auscultation bilaterally. GI: Soft, nontender, non-distended  MS: Non-pitting edema; No deformity. Neuro:  Nonfocal  Psych: Normal affect   Labs    Chemistry Recent Labs  Lab 08/15/21 1235 08/16/21 0312  NA 134* 134*  K 4.0 3.6  CL 102 103  CO2 23 23  GLUCOSE 410* 304*  BUN 20 20  CREATININE 1.19 1.00  CALCIUM 9.1 8.8*  GFRNONAA >60 >60  ANIONGAP 9 8     Hematology Recent Labs  Lab 08/15/21 1235 08/16/21 0312 08/17/21 0315  WBC 8.7 9.1 9.9  RBC 4.78 4.63 4.36  HGB 12.6* 12.4* 11.6*  HCT 40.1 37.9* 36.4*  MCV 83.9 81.9 83.5  MCH 26.4 26.8 26.6  MCHC 31.4 32.7 31.9  RDW 14.2 14.5 14.8  PLT 309 267 281    Cardiac EnzymesNo results for input(s): TROPONINI in the last 168 hours.  No results for input(s): TROPIPOC in the last 168 hours.   BNPNo results for input(s): BNP, PROBNP in the last 168 hours.   DDimer No results for input(s): DDIMER in the last 168 hours.   Radiology    CARDIAC CATHETERIZATION  Result Date: 08/17/2021 Conclusions: Severe native coronary artery disease, as detailed below, including functional occlusions of the mid LAD and mid LCx with competitive flow from bypass grafts, 70% distal LMCA stenosis, and multifocal RCA disease of up to 70%. Widely patent LIMA-LAD, SVG-D3, and SVG-OM2. Patent SVG-RPDA with 30% proximal/mid graft stenosis. Moderately elevated left heart filling pressure with significant respiratory variation (LVEDP 30 mmHg end-expiratory, 20-50mmHg mean). Recommendations: No targets for PCI evident on today's catheterization.  Optimize medical therapy; will add isosorbide mononitrate in place of nitroglycerin infusion. Diuresis given elevated LVEDP. Aggressive secondary prevention. Yvonne Kendall, MD [facility]  ECHOCARDIOGRAM COMPLETE  Result Date: 08/16/2021    ECHOCARDIOGRAM REPORT   Patient Name:   Joseph [name] [facility] Date of Exam: 08/16/2021 Medical Rec #:  [id number]       Height:       77.0 in Accession #:    [id number]      Weight:       344.7 lb Date of Birth:  03/26/1971       BSA:          2.819 m Patient Age:    50 years        BP:           99/80 mmHg Patient Gender: M               HR:           82 bpm. Exam Location:  Inpatient Procedure: 2D Echo, Cardiac Doppler, Color Doppler and Intracardiac            Opacification Agent Indications:    R07.9* Chest pain, unspecified  History:        Patient has prior history of Echocardiogram examinations, most                 recent 01/13/2021. CAD and Previous Myocardial Infarction, COPD;                 Risk Factors:Dyslipidemia, Hypertension and Diabetes.  Sonographer:    Eulah Pont RDCS Referring Phys: [id number] Manson Passey IMPRESSIONS  1. Left ventricular ejection fraction, by estimation, is 55%. The left ventricle has normal function. The left ventricle has no regional wall motion abnormalities. There is mild concentric left ventricular hypertrophy. Left ventricular diastolic parameters are consistent with Grade I diastolic dysfunction (impaired relaxation).  2. Right ventricular systolic function is low normal. The right ventricular size is normal.  3. The mitral valve is normal in structure. No evidence of mitral valve regurgitation.  4. The aortic valve is abnormal. There is mild calcification of the aortic valve. There is mild thickening of the aortic valve. Aortic valve regurgitation is not visualized.  5. Aortic dilatation noted. There is mild dilatation of the aortic root, measuring 42 mm. There is mild dilatation of the ascending aorta, measuring 43 mm. Comparison(s): A prior study was performed on 07/24/2020. Reviewed prior studies in series: Aortic Valve is consistent with a Bicuspid Aortic Valve (Sievers's Class 1 R-L fusion). FINDINGS   Left Ventricle: Left ventricular ejection fraction, by estimation, is 55%. The left ventricle has normal function. The left ventricle has no regional wall motion abnormalities. Definity contrast agent was given IV to delineate the left  ventricular endocardial borders. The left ventricular internal cavity size was normal in size. There is mild concentric left ventricular hypertrophy. Left ventricular diastolic parameters are consistent with Grade I diastolic dysfunction (impaired relaxation). Right Ventricle: The right ventricular size is normal. No increase in right ventricular wall thickness. Right ventricular systolic function is low normal. Left Atrium: Left atrial size was normal in size. Right Atrium: Right atrial size was normal in size. Pericardium: There is no evidence of pericardial effusion. Mitral Valve: The mitral valve is normal in structure. No evidence of mitral valve regurgitation. Tricuspid Valve: The tricuspid valve is normal in structure. Tricuspid valve regurgitation is not demonstrated. Aortic Valve: The aortic valve is abnormal. There is mild calcification of the aortic valve. There is mild thickening of the aortic valve. There is mild aortic valve annular calcification. Aortic valve regurgitation is not visualized. Pulmonic Valve: The pulmonic valve was not well visualized. Pulmonic valve regurgitation is not visualized. No evidence of pulmonic stenosis. Aorta: Aortic dilatation noted. There is mild dilatation of the aortic root, measuring 42 mm. There is mild dilatation of the ascending aorta, measuring 43 mm. IAS/Shunts: The atrial septum is grossly normal.  LEFT VENTRICLE PLAX 2D LVIDd:         4.90 cm  Diastology LVIDs:         3.20 cm  LV e' medial:    7.28 cm/s LV PW:         1.30 cm  LV E/e' medial:  9.1 LV IVS:        1.10 cm  LV e' lateral:   6.73 cm/s LVOT diam:     2.20 cm  LV E/e' lateral: 9.9 LV SV:         62 LV SV Index:   22 LVOT Area:     3.80 cm  RIGHT VENTRICLE RV S prime:      7.43 cm/s TAPSE (M-mode): 1.4 cm LEFT ATRIUM             Index       RIGHT ATRIUM          Index LA diam:        3.40 cm 1.21 cm/m  RA Area:     9.60 cm LA Vol (A2C):   44.2 ml 15.68 ml/m RA Volume:   14.50 ml 5.14 ml/m LA Vol (A4C):   52.7 ml 18.69 ml/m LA Biplane Vol: 49.1 ml 17.42 ml/m  AORTIC VALVE LVOT Vmax:   84.43 cm/s LVOT Vmean:  56.667 cm/s LVOT VTI:    0.164 m  AORTA Ao Root diam: 4.20 cm Ao Asc diam:  4.30 cm MITRAL VALVE MV Area (PHT): 2.45 cm    SHUNTS MV Decel Time: 310 msec    Systemic VTI:  0.16 m MV E velocity: 66.50 cm/s  Systemic Diam: 2.20 cm MV A velocity: 60.30 cm/s MV E/A ratio:  1.10 Riley LamMahesh Ugochukwu Chichester MD Electronically signed by Riley LamMahesh Jadae Steinke MD Signature Date/Time: 08/16/2021/12:20:45 PM    Final     Patient Profile     50 y.o. male with a history of CAD s/p CABG with residual CP but no viable targets and mild graft stenosis; PAD with prior interventions, COPD, Bicuspid Aortic Valve with mild Aortic Dilation who presents with chest pressure and is post LHC  Assessment & Plan    Coronary Artery Disease; Obstructive PAD - symptomatic  - anatomy: (LIMA-->LAD, SVG-->PDA, OM1, D1) - continue ASA 81 mg and plavix indefinitely - continue high dose statin, goal  LDL < 70, fenofibrate is reasonable - continue BB 6.25 mg PO BID - continue nitrates; Imdur to 60 mg PO daily - IV lasix 40 mg for 8/30 with PO transition for 8/31 - unclear midodrine indication as outpatient; will not plan to send home on this medication   Bicuspid Aortic Valve with TAA - no evidence of severe AS at this time will need serial monitoring  COPD  Morbid Obesity and OSA on CPAP - home meds, will reach out to [facility] if assistance is needed  Diabetes  with diabetes neuropathy  Lovenox for DVT PPX; distant hx of PE not on xarelto as outpatient (Recently)  Full Code  Labs ordered  Cardiac Carb consistent diet  Potential 08/19/21 DC  For questions or updates, please contact [facility]  [facility] Please consult [email or web address] for contact info under Cardiology/STEMI.      Signed, Christell Constant, MD  08/18/2021, 10:17 AM

## 2021-08-19 DIAGNOSIS — I200 Unstable angina: Secondary | ICD-10-CM | POA: Diagnosis not present

## 2021-08-19 DIAGNOSIS — I712 Thoracic aortic aneurysm, without rupture: Secondary | ICD-10-CM | POA: Diagnosis not present

## 2021-08-19 DIAGNOSIS — Q231 Congenital insufficiency of aortic valve: Secondary | ICD-10-CM | POA: Diagnosis not present

## 2021-08-19 LAB — BASIC METABOLIC PANEL
Anion gap: 7 (ref 5–15)
BUN: 34 mg/dL — ABNORMAL HIGH (ref 6–20)
CO2: 26 mmol/L (ref 22–32)
Calcium: 9 mg/dL (ref 8.9–10.3)
Chloride: 102 mmol/L (ref 98–111)
Creatinine, Ser: 1.57 mg/dL — ABNORMAL HIGH (ref 0.61–1.24)
GFR, Estimated: 54 mL/min — ABNORMAL LOW (ref >60)
Glucose, Bld: 248 mg/dL — ABNORMAL HIGH (ref 70–99)
Potassium: 5.4 mmol/L — ABNORMAL HIGH (ref 3.5–5.1)
Sodium: 135 mmol/L (ref 135–145)

## 2021-08-19 LAB — GLUCOSE, CAPILLARY
Glucose-Capillary: 214 mg/dL — ABNORMAL HIGH (ref 70–99)
Glucose-Capillary: 176 mg/dL — ABNORMAL HIGH (ref 70–99)
Glucose-Capillary: 74 mg/dL (ref 70–99)
Glucose-Capillary: 153 mg/dL — ABNORMAL HIGH (ref 70–99)

## 2021-08-19 LAB — CBC
HCT: 37.9 % — ABNORMAL LOW (ref 39.0–52.0)
Hemoglobin: 11.9 g/dL — ABNORMAL LOW (ref 13.0–17.0)
MCH: 26.6 pg (ref 26.0–34.0)
MCHC: 31.4 g/dL (ref 30.0–36.0)
MCV: 84.6 fL (ref 80.0–100.0)
Platelets: 309 10*3/uL (ref 150–400)
RBC: 4.48 MIL/uL (ref 4.22–5.81)
RDW: 14.9 % (ref 11.5–15.5)
WBC: 9.2 10*3/uL (ref 4.0–10.5)
nRBC: 0 % (ref 0.0–0.2)

## 2021-08-19 MED ORDER — OXYCODONE HCL 5 MG PO TABS
15.0000 mg | ORAL_TABLET | ORAL | Status: DC | PRN
  Administered 2021-08-19 – 2021-08-20 (×5): 15 mg via ORAL
  Filled 2021-08-19 (×5): qty 3

## 2021-08-19 MED ORDER — CARVEDILOL 6.25 MG PO TABS
6.2500 mg | ORAL_TABLET | Freq: Once | ORAL | Status: AC
  Administered 2021-08-19: 6.25 mg via ORAL
  Filled 2021-08-19: qty 1

## 2021-08-19 MED ORDER — CARVEDILOL 12.5 MG PO TABS
12.5000 mg | ORAL_TABLET | Freq: Two times a day (BID) | ORAL | Status: DC
  Administered 2021-08-19 – 2021-08-20 (×2): 12.5 mg via ORAL
  Filled 2021-08-19 (×2): qty 1

## 2021-08-19 MED ORDER — ENOXAPARIN SODIUM 80 MG/0.8ML IJ SOSY
80.0000 mg | PREFILLED_SYRINGE | INTRAMUSCULAR | Status: DC
  Administered 2021-08-19 – 2021-08-20 (×2): 80 mg via SUBCUTANEOUS
  Filled 2021-08-19 (×2): qty 0.8

## 2021-08-19 NOTE — Progress Notes (Signed)
Progress Note  Patient Name: Joseph [name] [place] Date of Encounter: 08/19/2021  Primary Cardiologist: Chilton Si, MD   Subjective   Patient notes one episode of chest pain this AM that woke up up from sleep.  No other CP events.  Notes that his neuropathic pain is also worse.  Inpatient Medications    Scheduled Meds:  ALPRAZolam  1 mg Oral QHS   aspirin EC  81 mg Oral Daily   buPROPion  150 mg Oral BID   carvedilol  6.25 mg Oral BID   Chlorhexidine Gluconate Cloth  6 each Topical Q0600   clopidogrel  75 mg Oral Q breakfast   enoxaparin (LOVENOX) injection  80 mg Subcutaneous Q24H   escitalopram  10 mg Oral q AM   fenofibrate  160 mg Oral Daily   furosemide  40 mg Oral Daily   insulin aspart  0-15 Units Subcutaneous TID WC   insulin aspart  0-5 Units Subcutaneous QHS   insulin aspart  10 Units Subcutaneous TID WC   insulin glargine-yfgn  65 Units Subcutaneous BID   isosorbide mononitrate  60 mg Oral Daily   mupirocin ointment  1 application Nasal BID   pantoprazole  40 mg Oral Daily   pregabalin  150 mg Oral QHS   pregabalin  300 mg Oral Daily   rosuvastatin  40 mg Oral QPM   sodium chloride flush  3 mL Intravenous Q12H   sodium chloride flush  3 mL Intravenous Q12H   Continuous Infusions:  sodium chloride     PRN Meds: sodium chloride, acetaminophen, albuterol, nitroGLYCERIN, ondansetron (ZOFRAN) IV, oxyCODONE, sodium chloride flush   Vital Signs    Vitals:   08/18/21 2000 08/18/21 2300 08/19/21 0000 08/19/21 0400  BP: 134/86  113/68 118/63  Pulse: 89  82 79  Resp: (!) 26 18 14 17   Temp: 98.1 F (36.7 C)  98.1 F (36.7 C) 97.8 F (36.6 C)  TempSrc: Oral  Oral Oral  SpO2: 98%  96% 98%  Weight:    (!) 163.4 kg  Height:        Intake/Output Summary (Last 24 hours) at 08/19/2021 0956 Last data filed at 08/19/2021 08/21/2021 Gross per 24 hour  Intake 3 ml  Output 1100 ml  Net -1097 ml   Filed Weights   08/16/21 0500 08/17/21 0418 08/19/21 0400  Weight: (!)  156.4 kg (!) 159.2 kg (!) 163.4 kg    Telemetry    Sinus rhythm to sinus tachycardia- Personally Reviewed  ECG    No new Personally Reviewed  Physical Exam   GEN: No acute distress.   Neck: No JVD Cardiac: RRR, no murmurs, rubs, or gallops.  Respiratory: Clear to auscultation bilaterally. GI: Soft, nontender, non-distended  MS: Non-pitting edema; No deformity. Neuro:  Nonfocal  Psych: Normal affect   Labs    Chemistry Recent Labs  Lab 08/15/21 1235 08/16/21 0312 08/19/21 0325  NA 134* 134* 135  K 4.0 3.6 5.4*  CL 102 103 102  CO2 23 23 26   GLUCOSE 410* 304* 248*  BUN 20 20 34*  CREATININE 1.19 1.00 1.57*  CALCIUM 9.1 8.8* 9.0  GFRNONAA >60 >60 54*  ANIONGAP 9 8 7      Hematology Recent Labs  Lab 08/16/21 0312 08/17/21 0315 08/19/21 0325  WBC 9.1 9.9 9.2  RBC 4.63 4.36 4.48  HGB 12.4* 11.6* 11.9*  HCT 37.9* 36.4* 37.9*  MCV 81.9 83.5 84.6  MCH 26.8 26.6 26.6  MCHC 32.7 31.9 31.4  RDW 14.5 14.8 14.9  PLT 267 281 309    Cardiac EnzymesNo results for input(s): TROPONINI in the last 168 hours. No results for input(s): TROPIPOC in the last 168 hours.   BNPNo results for input(s): BNP, PROBNP in the last 168 hours.   DDimer No results for input(s): DDIMER in the last 168 hours.   Radiology    No results found.  Patient Profile     50 y.o. male with a history of CAD s/p CABG with residual CP but no viable targets and mild graft stenosis; PAD with prior interventions, COPD, Bicuspid Aortic Valve with mild Aortic Dilation who presents with chest pressure and is post LHC  Assessment & Plan    Coronary Artery Disease; Obstructive PAD - symptomatic  - anatomy: (LIMA-->LAD, SVG-->PDA, OM1, D1) - continue ASA 81 mg and plavix indefinitely - continue high dose statin, goal LDL < 70, fenofibrate is reasonable - increasing BB 12.5 mg PO BID - continue nitrates; Imdur  60 mg PO daily  - lasix 40 mg PO Daily - unclear midodrine indication as outpatient;  will not plan to send home on this medication   Bicuspid Aortic Valve with TAA - no evidence of severe AS at this time will need serial monitoring  COPD  Morbid Obesity and OSA on CPAP - home meds; would benefit from outpatient PCP f/u  Diabetes  with diabetes neuropathy - home meds - has breakthrough pain PRN orders for severe pain; if worsening will need TRH or Pain consult for mgmt  Lovenox for DVT PPX; distant hx of PE not on xarelto as outpatient- no plans for home Xarelto   Full Code  Cardiac Carb consistent diet  Given worsening CP; will defer DC until hopefully 08/20/21.  For questions or updates, please contact [facility] Please consult [email or web address] for contact info under Cardiology/STEMI.      Signed, Christell Constant, MD  08/19/2021, 9:56 AM

## 2021-08-20 ENCOUNTER — Other Ambulatory Visit (HOSPITAL_COMMUNITY): Payer: Self-pay

## 2021-08-20 DIAGNOSIS — I739 Peripheral vascular disease, unspecified: Secondary | ICD-10-CM | POA: Diagnosis not present

## 2021-08-20 DIAGNOSIS — I200 Unstable angina: Secondary | ICD-10-CM | POA: Diagnosis not present

## 2021-08-20 DIAGNOSIS — E875 Hyperkalemia: Secondary | ICD-10-CM | POA: Diagnosis not present

## 2021-08-20 DIAGNOSIS — E1151 Type 2 diabetes mellitus with diabetic peripheral angiopathy without gangrene: Secondary | ICD-10-CM

## 2021-08-20 LAB — BASIC METABOLIC PANEL
Anion gap: 5 (ref 5–15)
Anion gap: 9 (ref 5–15)
BUN: 37 mg/dL — ABNORMAL HIGH (ref 6–20)
BUN: 38 mg/dL — ABNORMAL HIGH (ref 6–20)
CO2: 27 mmol/L (ref 22–32)
CO2: 23 mmol/L (ref 22–32)
Calcium: 9 mg/dL (ref 8.9–10.3)
Calcium: 8.6 mg/dL — ABNORMAL LOW (ref 8.9–10.3)
Chloride: 105 mmol/L (ref 98–111)
Chloride: 104 mmol/L (ref 98–111)
Creatinine, Ser: 1.77 mg/dL — ABNORMAL HIGH (ref 0.61–1.24)
Creatinine, Ser: 1.68 mg/dL — ABNORMAL HIGH (ref 0.61–1.24)
GFR, Estimated: 47 mL/min — ABNORMAL LOW (ref >60)
GFR, Estimated: 50 mL/min — ABNORMAL LOW (ref >60)
Glucose, Bld: 128 mg/dL — ABNORMAL HIGH (ref 70–99)
Glucose, Bld: 85 mg/dL (ref 70–99)
Potassium: 5.8 mmol/L — ABNORMAL HIGH (ref 3.5–5.1)
Potassium: 4.6 mmol/L (ref 3.5–5.1)
Sodium: 137 mmol/L (ref 135–145)
Sodium: 136 mmol/L (ref 135–145)

## 2021-08-20 LAB — GLUCOSE, CAPILLARY
Glucose-Capillary: 111 mg/dL — ABNORMAL HIGH (ref 70–99)
Glucose-Capillary: 92 mg/dL (ref 70–99)

## 2021-08-20 MED ORDER — MUPIROCIN 2 % EX OINT
1.0000 "application " | TOPICAL_OINTMENT | Freq: Two times a day (BID) | CUTANEOUS | 0 refills | Status: DC
  Filled 2021-08-20: qty 22, 11d supply, fill #0

## 2021-08-20 MED ORDER — ROSUVASTATIN CALCIUM 40 MG PO TABS
40.0000 mg | ORAL_TABLET | Freq: Every evening | ORAL | 2 refills | Status: DC
Start: 2021-08-20 — End: 2021-11-26
  Filled 2021-08-20: qty 60, 60d supply, fill #0

## 2021-08-20 MED ORDER — ISOSORBIDE MONONITRATE ER 60 MG PO TB24
120.0000 mg | ORAL_TABLET | Freq: Every day | ORAL | Status: DC
  Administered 2021-08-20: 120 mg via ORAL
  Filled 2021-08-20: qty 2

## 2021-08-20 MED ORDER — ISOSORBIDE MONONITRATE ER 60 MG PO TB24
120.0000 mg | ORAL_TABLET | Freq: Every day | ORAL | 2 refills | Status: DC
  Filled 2021-08-20: qty 120, 60d supply, fill #0

## 2021-08-20 MED ORDER — CARVEDILOL 12.5 MG PO TABS
12.5000 mg | ORAL_TABLET | Freq: Two times a day (BID) | ORAL | 2 refills | Status: DC
  Filled 2021-08-20: qty 60, 30d supply, fill #0

## 2021-08-20 MED ORDER — SODIUM ZIRCONIUM CYCLOSILICATE 10 G PO PACK
10.0000 g | PACK | Freq: Once | ORAL | Status: AC
  Administered 2021-08-20: 10 g via ORAL
  Filled 2021-08-20: qty 1

## 2021-08-20 MED ORDER — FENOFIBRATE 160 MG PO TABS
160.0000 mg | ORAL_TABLET | Freq: Every day | ORAL | 2 refills | Status: DC
  Filled 2021-08-20: qty 60, 60d supply, fill #0

## 2021-08-20 MED ORDER — MUPIROCIN 2 % EX OINT: TOPICAL_OINTMENT | Freq: Two times a day (BID) | CUTANEOUS | Status: DC

## 2021-08-20 MED ORDER — MUPIROCIN 2 % EX OINT
TOPICAL_OINTMENT | Freq: Two times a day (BID) | CUTANEOUS | 0 refills | Status: DC
Start: 2021-08-20 — End: 2022-01-06
  Filled 2021-08-20: qty 22, fill #0

## 2021-08-20 NOTE — Consult Note (Signed)
WOC Nurse Consult Note: Patient receiving care in [facility] 3E21 Patient needs a FU appointment with Dr. [name] [phone]. He has also been followed by [facility] and may need a FU with them as well but he can start with Dr. [name]. Reason for Consult: BL foot wounds Wound type: crater type ulcer on the base of the big toe on the left foot surrounded by callus. This wound appears to need debridement.  One small ulcer on the right foot at the arch. Scabbed wounds on the shin of the LLE Dressing procedure/placement/frequency: We will order and apply Mupuricin ointment to the areas for now and strongly advise this patient to FU with Dr. [name] for him to assess both feet and the LLE. Clean BLE and feet with soap and water, rinse and pat dry. Apply mupirocin ointment to the open wounds and the scabbed wounds on the LLE. Cover the areas with dry gauze then wrap with Kerlix. Apply twice daily  Monitor the wound area(s) for worsening of condition such as: Signs/symptoms of infection, increase in size, development of or worsening of odor, development of pain, or increased pain at the affected locations.   Notify the medical team if any of these develop.  Thank you for the consult. WOC nurse will not follow at this time.   Please re-consult the WOC team if needed.  Renaldo Reel Katrinka Blazing, MSN, RN, CMSRN, Angus Seller, [facility] Wound Treatment Associate Pager [phone]

## 2021-08-20 NOTE — TOC Transition Note (Signed)
Transition of Care [facility]) - CM/SW Discharge Note   Patient Details  Name: Joseph [name] MRN: [id number] Date of Birth: [birth year]  Transition of Care [facility]) CM/SW Contact:  Leone Haven, RN Phone Number: 08/20/2021, 12:30 PM   Clinical Narrative:     NCM spoke with patient at bedside, asked why did he miss his ortho apt, he states [place] [address] and his aunt gave him another care which he has been using but his wife drives it for him and takes him to his apts,  he states he would like to have food resource information and food stamp information.  CSW  will bring the packet for food resources for patient.  Per Staff RN Mindi Junker MD is awaiting patient's A1C to come back also which is going to take a couple of hours.    Final next level of care: Home/Self Care Barriers to Discharge: No Barriers Identified   Patient Goals and CMS Choice Patient states their goals for this hospitalization and ongoing recovery are:: return home with wife   Choice offered to / list presented to : NA  Discharge Placement                       Discharge Plan and Services   Discharge Planning Services: CM Consult              DME Agency: NA       HH Arranged: NA          Social Determinants of Health (SDOH) Interventions     Readmission Risk Interventions Readmission Risk Prevention Plan 08/20/2021 07/12/2021  Transportation Screening Complete Complete  Medication Review Oceanographer) Complete Complete  PCP or Specialist appointment within 3-5 days of discharge Complete Complete  HRI or Home Care Consult Complete Complete  SW Recovery Care/Counseling Consult Complete Complete  Palliative Care Screening Not Applicable Not Applicable  Skilled Nursing Facility Not Applicable Not Applicable  Some recent data might be hidden

## 2021-08-20 NOTE — Plan of Care (Signed)
  Problem: Education: Goal: Understanding of cardiac disease, CV risk reduction, and recovery process will improve Outcome: Completed/Met Goal: Individualized Educational Video(s) Outcome: Completed/Met   Problem: Education: Goal: Individualized Educational Video(s) Outcome: Completed/Met   Problem: Activity: Goal: Ability to tolerate increased activity will improve Outcome: Completed/Met   Problem: Cardiac: Goal: Ability to achieve and maintain adequate cardiovascular perfusion will improve Outcome: Completed/Met   Problem: Health Behavior/Discharge Planning: Goal: Ability to safely manage health-related needs after discharge will improve Outcome: Completed/Met   Problem: Education: Goal: Knowledge of General Education information will improve Description: Including pain rating scale, medication(s)/side effects and non-pharmacologic comfort measures Outcome: Completed/Met   Problem: Health Behavior/Discharge Planning: Goal: Ability to manage health-related needs will improve Outcome: Completed/Met   Problem: Clinical Measurements: Goal: Ability to maintain clinical measurements within normal limits will improve Outcome: Completed/Met Goal: Will remain free from infection Outcome: Completed/Met Goal: Diagnostic test results will improve Outcome: Completed/Met Goal: Respiratory complications will improve Outcome: Completed/Met Goal: Cardiovascular complication will be avoided Outcome: Completed/Met   Problem: Clinical Measurements: Goal: Will remain free from infection Outcome: Completed/Met   Problem: Clinical Measurements: Goal: Diagnostic test results will improve Outcome: Completed/Met   Problem: Clinical Measurements: Goal: Respiratory complications will improve Outcome: Completed/Met   Problem: Clinical Measurements: Goal: Cardiovascular complication will be avoided Outcome: Completed/Met   Problem: Activity: Goal: Risk for activity intolerance will  decrease Outcome: Completed/Met   Problem: Nutrition: Goal: Adequate nutrition will be maintained Outcome: Completed/Met   Problem: Coping: Goal: Level of anxiety will decrease Outcome: Completed/Met   Problem: Elimination: Goal: Will not experience complications related to bowel motility Outcome: Completed/Met Goal: Will not experience complications related to urinary retention Outcome: Completed/Met   Problem: Pain Managment: Goal: General experience of comfort will improve Outcome: Completed/Met   Problem: Safety: Goal: Ability to remain free from injury will improve Outcome: Completed/Met   Problem: Skin Integrity: Goal: Risk for impaired skin integrity will decrease Outcome: Completed/Met   Discharge instructions reviewed with patient.  These included, but were not limited to, the following:  discharge medications, MD appointments, when to call the MD, infection control, wound care with bilateral feet., recommended diet, activity restrictions, pain management while also speaking of alternatives to chronic pain, when to call 9-1-1, chest pain management, etc.  Comprehension of instructions was verified via the "teach-back" technique.

## 2021-08-20 NOTE — Discharge Summary (Signed)
Discharge Summary    Patient ID: Joseph [name] MRN: [id number]; DOB: 1971-04-09  Admit date: 08/15/2021 Discharge date: 08/20/2021  PCP:  [name], NP   [name] Providers Cardiologist:  Skeet Latch, MD   {   Discharge Diagnoses    Active Problems:   Hyperlipidemia   History of pulmonary embolus (PE)   OSA (obstructive sleep apnea)   Insulin dependent type 2 diabetes mellitus ([place])   Uncontrolled type 2 diabetes mellitus with hyperglycemia, with long-term current use of insulin (HCC)   Deep venous thrombosis (HCC)   Obesity, Class III, BMI 40-49.9 (morbid obesity) ([place])   Peripheral nerve disease   Smoker   Unstable angina (HCC)   S/P CABG x 4   PAD (peripheral artery disease) (HCC)   Hyperkalemia   DM (diabetes mellitus) with peripheral vascular complication ([place])  Diagnostic Studies/Procedures    Echocardiogram 08/16/21:   1. Left ventricular ejection fraction, by estimation, is 55%. The left  ventricle has normal function. The left ventricle has no regional wall  motion abnormalities. There is mild concentric left ventricular  hypertrophy. Left ventricular diastolic  parameters are consistent with Grade I diastolic dysfunction (impaired  relaxation).   2. Right ventricular systolic function is low normal. The right  ventricular size is normal.   3. The mitral valve is normal in structure. No evidence of mitral valve  regurgitation.   4. The aortic valve is abnormal. There is mild calcification of the  aortic valve. There is mild thickening of the aortic valve. Aortic valve  regurgitation is not visualized.   5. Aortic dilatation noted. There is mild dilatation of the aortic root,  measuring 42 mm. There is mild dilatation of the ascending aorta,  measuring 43 mm.  ____________  LHC 08/17/21:  Conclusions: Severe native coronary artery disease, as detailed below, including functional occlusions of the mid LAD and mid LCx with competitive  flow from bypass grafts, 70% distal LMCA stenosis, and multifocal RCA disease of up to 70%. Widely patent LIMA-LAD, SVG-D3, and SVG-OM2. Patent SVG-RPDA with 30% proximal/mid graft stenosis. Moderately elevated left heart filling pressure with significant respiratory variation (LVEDP 30 mmHg end-expiratory, 20-73mHg mean).   Recommendations: No targets for PCI evident on today's catheterization.  Optimize medical therapy; will add isosorbide mononitrate in place of nitroglycerin infusion. Diuresis given elevated LVEDP. Aggressive secondary prevention.   Diagnostic Dominance: Right     History of Present Illness     Joseph [name] is a 50y.o. male with a hx of CAD s/p dLCx and s/p CABG in 2021(LIMA-->LAD, SVG-->PDA, OM1, D1), PAD with stents to the R extremity with prior R and L toe amputations, chronic ulcer of both feet great toe, COPD with significant prior smoking history, HTN, HLD, ischemic cardiomyopathy, DM2 with diabetes neuropathy, prior hx of morbid Obesity, OSA unclear if on CPAP, hx of DVT and PE on Xarelto [facility]  Joseph [name]     Joseph [name] having squeezing chest pressure with radiation to the right arm on the evening of [name] presentation. Symptoms felt similar to prior angina resulting in CABG. He was also having about a two week hx of SOB with mild exertion. Joseph [name] remained flat and EKG with no ischemic changes. Echocardiogram performed which showed and LVEF at 55% with no RWMA, mild LVH, G1DD and dilated aortic root at 428m [facility] that showed severe native coronary artery disease including functional occlusions of the  mid LAD and mid LCx with competitive flow from bypass grafts, 70% distal LMCA stenosis, and multifocal RCA disease of up to 70%, widely patent LIMA-LAD, SVG-D3, and SVG-OM2, patent SVG-RPDA with 30% proximal/mid graft stenosis and moderately elevated left heart filling pressure  with significant respiratory variation (LVEDP 30 mmHg end-expiratory, 20-62mHg mean). Unfortunately there were no targets for PCI with plans to optimize medical therapy . Imdur was added to his regimen and he was diuresed post cath given elevated LVEDP.   He was seen by the diabetes coordinator due to uncontrolled DM2. HbA1c on admission noted to be 11.8 with prior level at 13.7 on 07/10/21. He will need follow up with PCP. Will attempt to see if he qualifies for [facility] however this may be complicated by where he lives. Insulin doses were adjusted during his stay. Social work was also consulted due to poor living situation with his son (135yo. He was also seen by wound care due to ongoing chronic foot wound for which he has missed several VVS and ortho appointments for follow ups.   Wound care recommendations as below:   Patient needs a FU appointment with Dr. DSharol Given([phone] He has also been followed by [facility] and may need a FU with them as well but he can start with Dr. DSharol Given  Reason for Consult: BL foot wounds Wound type: crater type ulcer on the base of the big toe on the left foot surrounded by callus. This wound appears to need debridement.  One small ulcer on the right foot at the arch. Scabbed wounds on the shin of the LLE Dressing procedure/placement/frequency: We will order and apply Mupuricin ointment to the areas for now and strongly advise this patient to FU with Dr. DSharol Givenfor him to assess both feet and the LLE. Clean BLE and feet with soap and water, rinse and pat dry. Apply mupirocin ointment to the open wounds and the scabbed wounds on the LLE. Cover the areas with dry gauze then wrap with Kerlix. Apply twice daily    [name] problems as follows:  1. Coronary Artery Disease: -Echocardiogram performed which showed and LVEF at 55% with no RWMA, mild LVH, G1DD and dilated aortic root at 425m  -LHC 08/17/21 that showed severe native  coronary artery disease including functional occlusions of the mid LAD and mid LCx with competitive flow from bypass grafts, 70% distal LMCA stenosis, and multifocal RCA disease of up to 70%, widely patent LIMA-LAD, SVG-D3, and SVG-OM2, patent SVG-RPDA with 30% proximal/mid graft stenosis and moderately elevated left heart filling pressure with significant respiratory variation (LVEDP 30 mmHg end-expiratory, 20-2538m mean).  -Unfortunately there were no targets for PCI with plans to optimize medical therapy . Imdur was added to his regimen and he was diuresed post cath given elevated LVEDP.  -Continue ASA 81 mg and plavix indefinitely -Continue high dose statin, goal LDL < 70, fenofibrate is reasonable -Continue beta blocker 12.5 mg PO BID -Continue nitrates; Imdur  60 mg PO daily  -Continue lasix 40 mg PO Daily  2. Obstructive PAD: -He follows with Dr. FieOneida Alard had an abdominal aortogram and LE runoff on 09/2020. He was found to have multisegment stenosis of the left popliteal artery and left SFA. Stents were successfully placed in these regions. Right LE 50 to 70% stenoses in the right SFA with one-vessel runoff in the posterior tibial. Left LE 50 to 90% stenoses in the left SFA and 2 segments of 90% stenosis in  the popliteal.  These regions were also dilated and stented.  -Wound care consulted with recommendations as above  -Needs to keep follow up as this is essential for his care   3. Bicuspid Aortic Valve with TAA: -No evidence of severe AS at this time will need serial monitoring -Measuring 75m  4. Uncontrolled DM2: -Hb A1c elevated at 11.8 -Seen by DM coordinator with adjustments -Needs close OP follow up for his DM   5. COPD with significant prior smoking history: -Return home medications   6. Hx of DVT and PE: -Previously treated with Xarelto  -Unclear provoked on unprovoked status -Restarted ASA/Plavix   7. Hypokalemia: -K+ , 5.8  -Treated with Lokelma 132mx1 -Will  recheck BMET prior to discharge and will need recheck in 1 week at follow up    8. CKD stage III: -Creatinine 1.77 today with a baseline at 1.3-1.6 -Plan to recheck BMET at follow up on 08/28/21   Consultants: Wound care, DM coordinator   The patient has been seen and examined by Dr. HoPercival Spanishho feel that he is stable and ready for discharge today, 08/20/21. Will make follow up appointment prior to d/c.   Did the patient have an acute coronary syndrome (MI, NSTEMI, STEMI, etc) this admission?:  No                               Did the patient have a percutaneous coronary intervention (stent / angioplasty)?:  No.   _____________  Discharge Vitals Blood pressure 136/84, pulse 80, temperature 98.9 F (37.2 C), temperature source Oral, resp. rate 11, height _0  (1.956 m), weight (!) 163.6 kg, SpO2 98 %.  Filed Weights   08/17/21 0418 08/19/21 0400 08/20/21 0351  Weight: (!) 159.2 kg (!) 163.4 kg (!) 163.6 kg    Labs & Radiologic Studies    CBC Recent Labs    08/19/21 0325  WBC 9.2  HGB 11.9*  HCT 37.9*  MCV 84.6  PLT 30169 Basic Metabolic Panel Recent Labs    08/19/21 0325 08/20/21 0340  NA 135 137  K 5.4* 5.8*  CL 102 105  CO2 26 27  GLUCOSE 248* 128*  BUN 34* 37*  CREATININE 1.57* 1.77*  CALCIUM 9.0 9.0   Liver Function Tests No results for input(s): AST, ALT, ALKPHOS, BILITOT, PROT, ALBUMIN in the last 72 hours. No results for input(s): LIPASE, AMYLASE in the last 72 hours. High Sensitivity Troponin:   Recent Labs  Lab 08/15/21 1235 08/15/21 1520 08/15/21 1745 08/15/21 2019  TROPONINIHS 23* 18* 25* 20*    BNP Invalid input(s): POCBNP D-Dimer No results for input(s): DDIMER in the last 72 hours. Hemoglobin A1C No results for input(s): HGBA1C in the last 72 hours. Fasting Lipid Panel No results for input(s): CHOL, HDL, LDLCALC, TRIG, CHOLHDL, LDLDIRECT in the last 72 hours. Thyroid Function Tests No results for input(s): TSH, T4TOTAL, T3FREE, THYROIDAB  in the last 72 hours.  Invalid input(s): FREET3 _____________  DG Chest 2 View  Result Date: 08/15/2021 CLINICAL DATA:  history of asthma/COPD, PE on anticoagulation, DM, HTN, and ACS s/p CABG presents to the emergency department with chest pain. has been ongoing since midnight. He states intermittently since he was hospitalized for chest pain 1 month ago. EXAM: CHEST - 2 VIEW COMPARISON:  07/16/2021 and previous FINDINGS: Relatively low lung volumes with prominent interstitial markings at the left lung base as before. Heart size and  mediastinal contours are within normal limits. CABG markers. No effusion. Sternotomy wires. Mild wedge deformity in lower thoracic vertebral bodies as before. IMPRESSION: No acute disease post CABG. Electronically Signed   By: Lucrezia Europe M.D.   On: 08/15/2021 13:02   CARDIAC CATHETERIZATION  Result Date: 08/17/2021 Conclusions: Severe native coronary artery disease, as detailed below, including functional occlusions of the mid LAD and mid LCx with competitive flow from bypass grafts, 70% distal LMCA stenosis, and multifocal RCA disease of up to 70%. Widely patent LIMA-LAD, SVG-D3, and SVG-OM2. Patent SVG-RPDA with 30% proximal/mid graft stenosis. Moderately elevated left heart filling pressure with significant respiratory variation (LVEDP 30 mmHg end-expiratory, 20-28mHg mean). Recommendations: No targets for PCI evident on today's catheterization.  Optimize medical therapy; will add isosorbide mononitrate in place of nitroglycerin infusion. Diuresis given elevated LVEDP. Aggressive secondary prevention. CNelva Bush MD [name]  ECHOCARDIOGRAM COMPLETE  Result Date: 08/16/2021    ECHOCARDIOGRAM REPORT   Patient Name:   [facility]ate of Exam: 08/16/2021 Medical Rec #:  [id number]      Height:       77.0 in Accession #:    [id number]     Weight:       344.7 lb Date of Birth:  [birth year]      BSA:          2.819 m Patient Age:    89years        BP:            99/80 mmHg Patient Gender: M               HR:           82 bpm. Exam Location:  Inpatient Procedure: 2D Echo, Cardiac Doppler, Color Doppler and Intracardiac            Opacification Agent Indications:    R07.9* Chest pain, unspecified  History:        Patient has prior history of Echocardiogram examinations, most                 recent 01/13/2021. CAD and Previous Myocardial Infarction, COPD;                 Risk Factors:Dyslipidemia, Hypertension and Diabetes.  Sonographer:    SBernadene PersonRDCS Referring Phys: [id number] 1. Left ventricular ejection fraction, by estimation, is 55%. The left ventricle has normal function. The left ventricle has no regional wall motion abnormalities. There is mild concentric left ventricular hypertrophy. Left ventricular diastolic parameters are consistent with Grade I diastolic dysfunction (impaired relaxation).  2. Right ventricular systolic function is low normal. The right ventricular size is normal.  3. The mitral valve is normal in structure. No evidence of mitral valve regurgitation.  4. The aortic valve is abnormal. There is mild calcification of the aortic valve. There is mild thickening of the aortic valve. Aortic valve regurgitation is not visualized.  5. Aortic dilatation noted. There is mild dilatation of the aortic root, measuring 42 mm. There is mild dilatation of the ascending aorta, measuring 43 mm. Comparison(s): A prior study was performed on 07/24/2020. Reviewed prior studies in series: Aortic Valve is consistent with a Bicuspid Aortic Valve (Sievers's Class 1 R-L fusion). FINDINGS  Left Ventricle: Left ventricular ejection fraction, by estimation, is 55%. The left ventricle has normal function. The left ventricle has no regional wall motion abnormalities. Definity contrast agent was given IV to delineate the  left ventricular endocardial borders. The left ventricular internal cavity size was normal in size. There is mild concentric  left ventricular hypertrophy. Left ventricular diastolic parameters are consistent with Grade I diastolic dysfunction (impaired relaxation). Right Ventricle: The right ventricular size is normal. No increase in right ventricular wall thickness. Right ventricular systolic function is low normal. Left Atrium: Left atrial size was normal in size. Right Atrium: Right atrial size was normal in size. Pericardium: There is no evidence of pericardial effusion. Mitral Valve: The mitral valve is normal in structure. No evidence of mitral valve regurgitation. Tricuspid Valve: The tricuspid valve is normal in structure. Tricuspid valve regurgitation is not demonstrated. Aortic Valve: The aortic valve is abnormal. There is mild calcification of the aortic valve. There is mild thickening of the aortic valve. There is mild aortic valve annular calcification. Aortic valve regurgitation is not visualized. Pulmonic Valve: The pulmonic valve was not well visualized. Pulmonic valve regurgitation is not visualized. No evidence of pulmonic stenosis. Aorta: Aortic dilatation noted. There is mild dilatation of the aortic root, measuring 42 mm. There is mild dilatation of the ascending aorta, measuring 43 mm. IAS/Shunts: The atrial septum is grossly normal.  LEFT VENTRICLE PLAX 2D LVIDd:         4.90 cm  Diastology LVIDs:         3.20 cm  LV e' medial:    7.28 cm/s LV PW:         1.30 cm  LV E/e' medial:  9.1 LV IVS:        1.10 cm  LV e' lateral:   6.73 cm/s LVOT diam:     2.20 cm  LV E/e' lateral: 9.9 LV SV:         62 LV SV Index:   22 LVOT Area:     3.80 cm  RIGHT VENTRICLE RV S prime:     7.43 cm/s TAPSE (M-mode): 1.4 cm LEFT ATRIUM             Index       RIGHT ATRIUM          Index LA diam:        3.40 cm 1.21 cm/m  RA Area:     9.60 cm LA Vol (A2C):   44.2 ml 15.68 ml/m RA Volume:   14.50 ml 5.14 ml/m LA Vol (A4C):   52.7 ml 18.69 ml/m LA Biplane Vol: 49.1 ml 17.42 ml/m  AORTIC VALVE LVOT Vmax:   84.43 cm/s LVOT Vmean:   56.667 cm/s LVOT VTI:    0.164 m  AORTA Ao Root diam: 4.20 cm Ao Asc diam:  4.30 cm MITRAL VALVE MV Area (PHT): 2.45 cm    SHUNTS MV Decel Time: 310 msec    Systemic VTI:  0.16 m MV E velocity: 66.50 cm/s  Systemic Diam: 2.20 cm MV A velocity: 60.30 cm/s MV E/A ratio:  1.10 Joseph Haskell MD Electronically signed by Joseph Haskell MD Signature Date/Time: 08/16/2021/12:20:45 PM    Final    Disposition   Pt is being discharged home today in good condition.  Follow-up Plans & Appointments    Follow-up Information     Loel Dubonnet, NP Follow up on 08/28/2021.   Specialty: Cardiology Why: at 11am. Please arrive at 10:45am Contact information: [place] [address] [phone]         Newt Minion, MD. Call.   Specialty: Orthopedic Surgery Why: please call to make an appointment with Dr. Sharol Given  or team member Contact information: [place] [address] [phone]                 Discharge Medications   Allergies as of 08/20/2021       Reactions   Sulfa Antibiotics Other (See Comments)   Headaches        Medication List     STOP taking these medications    midodrine 5 MG tablet Commonly known as: PROAMATINE       TAKE these medications    albuterol 108 (90 Base) MCG/ACT inhaler Commonly known as: VENTOLIN HFA Inhale 2 puffs into the lungs every 6 (six) hours as needed for wheezing or shortness of breath.   ALPRAZolam 1 MG tablet Commonly known as: XANAX Take 1 mg by mouth See admin instructions. 1 tablet by mouth twice daily and 2 tabs at bedtime.   aspirin EC 81 MG tablet Take 1 tablet (81 mg total) by mouth daily. Swallow whole.   atorvastatin 80 MG tablet Commonly known as: LIPITOR Take 1 tablet (80 mg total) by mouth daily at 6 PM.   buPROPion 150 MG 12 hr tablet Commonly known as: WELLBUTRIN SR Take 150 mg by mouth 2 (two) times daily.   carvedilol 12.5 MG tablet Commonly known as:  COREG Take 1 tablet (12.5 mg total) by mouth 2 (two) times daily. What changed:  medication strength how much to take   clopidogrel 75 MG tablet Commonly known as: PLAVIX Take 1 tablet (75 mg total) by mouth daily with breakfast.   escitalopram 10 MG tablet Commonly known as: LEXAPRO Take 10 mg by mouth in the morning.   fenofibrate 160 MG tablet Take 1 tablet (160 mg total) by mouth daily. Start taking on: August 21, 2021 What changed:  medication strength how much to take when to take this   furosemide 40 MG tablet Commonly known as: LASIX Take 1 tablet (40 mg total) by mouth daily.   insulin aspart 100 UNIT/ML FlexPen Commonly known as: NOVOLOG Inject 12 Units into the skin 3 (three) times daily with meals.   insulin glargine 100 UNIT/ML injection Commonly known as: LANTUS Inject 60 Units into the skin 2 (two) times daily.   isosorbide mononitrate 120 MG 24 hr tablet Commonly known as: IMDUR Take 1 tablet (120 mg total) by mouth daily. Start taking on: August 21, 2021   liraglutide 18 MG/3ML Sopn Commonly known as: VICTOZA Inject 1.8 mg into the skin daily.   meclizine 25 MG tablet Commonly known as: ANTIVERT Take 25 mg by mouth at bedtime.   meclizine 25 MG tablet Commonly known as: ANTIVERT Take 50 mg by mouth daily with breakfast.   metFORMIN 1000 MG tablet Commonly known as: GLUCOPHAGE Take 1 tablet (1,000 mg total) by mouth 2 (two) times daily with a meal.   mupirocin ointment 2 % Commonly known as: BACTROBAN Place 1 application into the nose 2 (two) times daily.   mupirocin ointment 2 % Commonly known as: BACTROBAN Apply topically 2 (two) times daily.   naloxone 4 MG/0.1ML Liqd nasal spray kit Commonly known as: NARCAN Place 4 mg into the nose as needed (overdose).   nitroGLYCERIN 0.4 MG SL tablet Commonly known as: NITROSTAT Place 0.4 mg under the tongue every 5 (five) minutes as needed for chest pain.   oxyCODONE 15 MG immediate  release tablet Commonly known as: ROXICODONE Take 1 tablet (15 mg total) by mouth 5 (five) times daily.   pantoprazole 40 MG  tablet Commonly known as: PROTONIX Take 1 tablet (40 mg total) by mouth daily.   pregabalin 150 MG capsule Commonly known as: LYRICA Take 150 mg by mouth at bedtime.   pregabalin 150 MG capsule Commonly known as: LYRICA Take 300 mg by mouth in the morning.   rosuvastatin 40 MG tablet Commonly known as: CRESTOR Take 1 tablet (40 mg total) by mouth every evening.               Discharge Care Instructions  (From admission, onward)           Start     Ordered   08/20/21 0000  Change dressing (specify)       Comments: See d/c instructions as above   08/20/21 1149             Outstanding Labs/Studies   BMET at follow up appointment   Duration of Discharge Encounter   Greater than 30 minutes including physician time.  Signed, Kathyrn Drown, NP 08/20/2021, 11:53 AM

## 2021-08-20 NOTE — Plan of Care (Signed)
  Problem: Education: Goal: Knowledge of General Education information will improve Description: Including pain rating scale, medication(s)/side effects and non-pharmacologic comfort measures Outcome: Progressing   Problem: Clinical Measurements: Goal: Respiratory complications will improve Outcome: Progressing   Problem: Activity: Goal: Risk for activity intolerance will decrease Outcome: Progressing   Problem: Safety: Goal: Ability to remain free from injury will improve Outcome: Progressing   

## 2021-08-20 NOTE — Progress Notes (Signed)
Patient discharged to home via private vehicle with wife.  Escorted to exit with belongings and discharge instructions via wheelchair by nurse tech.

## 2021-08-20 NOTE — Care Management Important Message (Signed)
Important Message  Patient Details  Name: Joseph [name] MRN: [id number] Date of Birth: 1971-01-01   Medicare Important Message Given:  Yes     Renie Ora 08/20/2021, 9:10 AM

## 2021-08-20 NOTE — Progress Notes (Signed)
Progress Note  Patient Name: Joseph [name] Date of Encounter: 08/20/2021  Primary Cardiologist:   Chilton Si, MD   Subjective   He has continued off and on chest pain.  His biggest issues are not having wound care on his feet with non healing ulcers and severe PVD status post multiple amputations and food insecurity as his food stamps might not be renewed.    Inpatient Medications    Scheduled Meds:  ALPRAZolam  1 mg Oral QHS   aspirin EC  81 mg Oral Daily   buPROPion  150 mg Oral BID   carvedilol  12.5 mg Oral BID   Chlorhexidine Gluconate Cloth  6 each Topical Q0600   clopidogrel  75 mg Oral Q breakfast   enoxaparin (LOVENOX) injection  80 mg Subcutaneous Q24H   escitalopram  10 mg Oral q AM   fenofibrate  160 mg Oral Daily   insulin aspart  0-15 Units Subcutaneous TID WC   insulin aspart  0-5 Units Subcutaneous QHS   insulin aspart  10 Units Subcutaneous TID WC   insulin glargine-yfgn  65 Units Subcutaneous BID   isosorbide mononitrate  60 mg Oral Daily   mupirocin ointment  1 application Nasal BID   pantoprazole  40 mg Oral Daily   pregabalin  150 mg Oral QHS   pregabalin  300 mg Oral Daily   rosuvastatin  40 mg Oral QPM   sodium chloride flush  3 mL Intravenous Q12H   sodium chloride flush  3 mL Intravenous Q12H   Continuous Infusions:  sodium chloride     PRN Meds: sodium chloride, acetaminophen, albuterol, nitroGLYCERIN, ondansetron (ZOFRAN) IV, oxyCODONE, sodium chloride flush   Vital Signs    Vitals:   08/20/21 0051 08/20/21 0100 08/20/21 0300 08/20/21 0351  BP: 116/83 (!) 89/76 136/84   Pulse: 86 84 80   Resp: 18 17 11    Temp:  98.7 F (37.1 C) 98.9 F (37.2 C)   TempSrc:  Oral Oral   SpO2: 96% 100% 98%   Weight:    (!) 163.6 kg  Height:        Intake/Output Summary (Last 24 hours) at 08/20/2021 0816 Last data filed at 08/20/2021 0354 Gross per 24 hour  Intake 1283 ml  Output 4275 ml  Net -2992 ml   Filed Weights   08/17/21 0418  08/19/21 0400 08/20/21 0351  Weight: (!) 159.2 kg (!) 163.4 kg (!) 163.6 kg    Telemetry    NSR - Personally Reviewed  ECG    NA - Personally Reviewed  Physical Exam   GEN: No acute distress.   Neck: No  JVD Cardiac: RRR, 2/6 apical systolic murmur, no diastolic murmurs, rubs, or gallops.  Respiratory: Clear  to auscultation bilaterally. GI: Soft, nontender, non-distended  MS: No  edema; No deformity. Extremities:  Ulcer without drainage or erythema on the plantar surface of his left foot.  Absent DP/PT bilateral Neuro:  Nonfocal , somnolent.M Psych: Normal affect   Labs    Chemistry Recent Labs  Lab 08/16/21 0312 08/19/21 0325 08/20/21 0340  NA 134* 135 137  K 3.6 5.4* 5.8*  CL 103 102 105  CO2 23 26 27   GLUCOSE 304* 248* 128*  BUN 20 34* 37*  CREATININE 1.00 1.57* 1.77*  CALCIUM 8.8* 9.0 9.0  GFRNONAA >60 54* 47*  ANIONGAP 8 7 5      Hematology Recent Labs  Lab 08/16/21 0312 08/17/21 0315 08/19/21 0325  WBC 9.1 9.9 9.2  RBC 4.63 4.36 4.48  HGB 12.4* 11.6* 11.9*  HCT 37.9* 36.4* 37.9*  MCV 81.9 83.5 84.6  MCH 26.8 26.6 26.6  MCHC 32.7 31.9 31.4  RDW 14.5 14.8 14.9  PLT 267 281 309    Cardiac EnzymesNo results for input(s): TROPONINI in the last 168 hours. No results for input(s): TROPIPOC in the last 168 hours.   BNPNo results for input(s): BNP, PROBNP in the last 168 hours.   DDimer No results for input(s): DDIMER in the last 168 hours.   Radiology    No results found.  Cardiac Studies   CATH:  Diagnostic Dominance: Right  Patient Profile     50 y.o. male with a history of CAD s/p CABG with residual CP but no viable targets and mild graft stenosis; PAD with prior interventions, COPD, Bicuspid Aortic Valve with mild Aortic Dilation who presents with chest pressure and is post LHC  Assessment & Plan    CAD:     Disease as above.  Medical management .  I will increase his Imdur. Pursuing OMT.    BICUSPID AORTIC VALVE:    Follow  clinically.     COPD:     Continue previous therapy.     DM:      Continue previous meds.   PVD: It looks like he has missed orthopedic out patient appts.  Not sure if that is transportation.  I would like to investigate to find out the most appropriate out patient follow up of his wounds and the barriers to follow up.    HYPERKALEMIA:  I will give Lokelma and check potassium later today.  I would try to get him out pending a repeat BMET this afternoon.    SOCIAL:  I would like our caseworker or social worker to talk to him to make sure that there is not food insecurity at home.     For questions or updates, please contact [facility] Please consult [email or web address] for contact info under Cardiology/STEMI.   Signed, Rollene Rotunda, MD  08/20/2021, 8:16 AM

## 2021-08-20 NOTE — Progress Notes (Signed)
Call placed to lab re:  need for 1400 BMET to be drawn ASAP.

## 2021-08-20 NOTE — TOC Initial Note (Signed)
Transition of Care [facility]) - Initial/Assessment Note    Patient Details  Name: Joseph [name] MRN: [id number] Date of Birth: [birth year]  [facility] CM/SW Contact:    Leone Haven, RN Phone Number: 08/20/2021, 12:28 PM  Clinical Narrative:                 NCM spoke with patient at bedside, asked why did he miss his ortho apt, he states [place] [address] and his aunt gave him another care which he has been using but his wife drives it for him and takes him to his apts,  he states he would like to have food resource information and food stamp information.  CSW  will bring the packet for food resources for patient.  Per Staff RN Mindi Junker MD is awaiting patient's A1C to come back also which is going to take a couple of hours.   Expected Discharge Plan: Home/Self Care Barriers to Discharge: No Barriers Identified   Patient Goals and CMS Choice Patient states their goals for this hospitalization and ongoing recovery are:: return home with wife   Choice offered to / list presented to : NA  Expected Discharge Plan and Services Expected Discharge Plan: Home/Self Care   Discharge Planning Services: CM Consult   Living arrangements for the past 2 months: Single Family Home Expected Discharge Date: 08/20/21                 DME Agency: NA       HH Arranged: NA          Prior Living Arrangements/Services Living arrangements for the past 2 months: Single Family Home Lives with:: Spouse Patient language and need for interpreter reviewed:: Yes Do you feel safe going back to the place where you live?: Yes      Need for Family Participation in Patient Care: Yes (Comment) Care giver support system in place?: Yes (comment)   Criminal Activity/Legal Involvement Pertinent to Current Situation/Hospitalization: No - Comment as needed  Activities of Daily Living Home Assistive Devices/Equipment: Environmental consultant (specify type), Wheelchair, CBG Meter ADL Screening  (condition at time of admission) Patient's cognitive ability adequate to safely complete daily activities?: Yes Is the patient deaf or have difficulty hearing?: Yes Does the patient have difficulty seeing, even when wearing glasses/contacts?: Yes (Lost his glasses) Does the patient have difficulty concentrating, remembering, or making decisions?: No Patient able to express need for assistance with ADLs?: Yes Does the patient have difficulty dressing or bathing?: No Independently performs ADLs?: Yes (appropriate for developmental age) Does the patient have difficulty walking or climbing stairs?: Yes Weakness of Legs: Both Weakness of Arms/Hands: Both (Neuropathy in both wrists and hands)  Permission Sought/Granted                  Emotional Assessment Appearance:: Appears stated age Attitude/Demeanor/Rapport: Engaged Affect (typically observed): Appropriate Orientation: : Oriented to Place, Oriented to Self, Oriented to  Time, Oriented to Situation Alcohol / Substance Use: Not Applicable Psych Involvement: No (comment)  Admission diagnosis:  Precordial pain [R07.2] Unstable angina (HCC) [I20.0] Patient Active Problem List   Diagnosis Date Noted   Hyperkalemia 08/20/2021   DM (diabetes mellitus) with peripheral vascular complication (HCC) 08/20/2021   Atypical chest pain 07/17/2021   Hyponatremia 07/17/2021   Leg wound, left 07/17/2021   Essential hypertension 07/17/2021   Acute hyperglycemia 07/09/2021   Left leg cellulitis 04/11/2021   Cellulitis of left leg 03/05/2021  Cellulitis 02/18/2021   PAD (peripheral artery disease) (HCC) 10/10/2020   Osteomyelitis of fourth toe of right foot (HCC)    S/P CABG x 4 07/24/2020   Non-ST elevation (NSTEMI) myocardial infarction (HCC) 07/22/2020   Acute on chronic diastolic heart failure (HCC) 07/22/2020   Hypotension 07/18/2020   Left leg swelling 07/18/2020   Elevated troponin 07/18/2020   AKI (acute kidney injury) (HCC)  07/18/2020   Elevated brain natriuretic peptide (BNP) level 07/18/2020   GERD (gastroesophageal reflux disease) 07/18/2020   Severe Sepsis secondary to Lt leg/Foot Cellulitis 07/18/2020   Severe sepsis --Severe Sepsis secondary to Lt leg/Foot Cellulitis with hypotension and AKI 07/18/2020   Unstable angina (HCC) 11/01/2019   Edema 06/13/2019   Leukocytosis 05/31/2019   Smoker 03/08/2019   Chronic pain 03/08/2019   Chronic back pain 02/16/2019   Deep venous thrombosis (HCC) 02/16/2019   Obesity, Class III, BMI 40-49.9 (morbid obesity) (HCC) 02/16/2019   Peripheral nerve disease 02/16/2019   COPD (chronic obstructive pulmonary disease) (HCC) 01/22/2019   Anxiety disorder 03/26/2018   Open wound of toe 10/12/2017   Sinusitis 10/12/2017   Abdominal pain    Loss of weight    Diabetic foot infection (HCC) 09/16/2017   Uncontrolled type 2 diabetes mellitus with hyperglycemia, with long-term current use of insulin (HCC) 09/16/2017   Cellulitis of right foot    Chronic ulcer of great toe of right foot (HCC) 09/09/2017   Recurrent chest pain 07/29/2016   Hyperglycemia    Insulin dependent type 2 diabetes mellitus (HCC) 04/29/2015   OSA (obstructive sleep apnea) 05/08/2013   History of pulmonary embolus (PE) 04/27/2013   CAD -S/P PCI 11/02/19  09/10/2008   Hyperlipidemia 09/09/2008   PCP:  Marva Panda, NP Pharmacy:   [facility] [phone] - [place], [place] - [address] AT [facility] OF [address] [place] [address] Phone: [phone] Fax: [phone]  [facility] #[id number] - [place], [place] - [address] AT SEC OF [address] & [address] [name] [address] Phone: [phone] Fax: [phone]  [facility] [address] [name] [address] Phone: [phone] Fax: [phone]     Social Determinants of Health (SDOH) Interventions    Readmission  Risk Interventions Readmission Risk Prevention Plan 08/20/2021 07/12/2021  Transportation Screening Complete Complete  Medication Review Oceanographer) Complete Complete  PCP or Specialist appointment within 3-5 days of discharge Complete Complete  HRI or Home Care Consult Complete Complete  SW Recovery Care/Counseling Consult Complete Complete  Palliative Care Screening Not Applicable Not Applicable  Skilled Nursing Facility Not Applicable Not Applicable  Some recent data might be hidden

## 2021-08-21 ENCOUNTER — Other Ambulatory Visit (HOSPITAL_COMMUNITY): Payer: Self-pay

## 2021-08-28 ENCOUNTER — Ambulatory Visit (HOSPITAL_BASED_OUTPATIENT_CLINIC_OR_DEPARTMENT_OTHER): Payer: Medicare HMO | Admitting: Family

## 2021-08-31 ENCOUNTER — Ambulatory Visit (INDEPENDENT_AMBULATORY_CARE_PROVIDER_SITE_OTHER): Payer: Medicare HMO | Admitting: Orthopedic Surgery

## 2021-08-31 DIAGNOSIS — L97929 Non-pressure chronic ulcer of unspecified part of left lower leg with unspecified severity: Secondary | ICD-10-CM | POA: Diagnosis not present

## 2021-08-31 DIAGNOSIS — T25311A Burn of third degree of right ankle, initial encounter: Secondary | ICD-10-CM

## 2021-08-31 DIAGNOSIS — I87332 Chronic venous hypertension (idiopathic) with ulcer and inflammation of left lower extremity: Secondary | ICD-10-CM | POA: Diagnosis not present

## 2021-08-31 DIAGNOSIS — Z89422 Acquired absence of other left toe(s): Secondary | ICD-10-CM | POA: Diagnosis not present

## 2021-09-01 ENCOUNTER — Encounter: Payer: Self-pay | Admitting: Orthopedic Surgery

## 2021-09-01 ENCOUNTER — Ambulatory Visit (HOSPITAL_BASED_OUTPATIENT_CLINIC_OR_DEPARTMENT_OTHER): Payer: Medicare HMO | Admitting: Family

## 2021-09-01 NOTE — Progress Notes (Signed)
Office Visit Note   Patient: Joseph [name]           Date of Birth: 13-Aug-1971           MRN: [id number] Visit Date: 08/31/2021              Requested by: [name], NP [address] [place],  [place] [address] PCP: [name], NP  Chief Complaint  Patient presents with   right foot wound   left foot wound      HPI: Patient is a 50 year old gentleman who presents for 2 separate issues.  #1 he states that about 5 days ago he was cooking some hot peppers and he dropped the peppers into his boot against his ankle.  Patient has been using some first-aid burn cream.  Patient states that the blisters have changed from a white blister now to a dark green and brownish blister.  Patient was seen by his primary care physician and has started Levaquin and Bactroban.  Patient also is seen in follow-up for his left foot.  Patient has chronic venous insufficiency of both lower extremities.  Assessment & Plan: Visit Diagnoses:  1. Full thickness burn of right ankle, initial encounter     Plan: I am concerned for the potentially full-thickness burn injury to the right ankle.  We will apply a Dynaflex wraps to both legs to see if this will help improve some microcirculation.  Follow-up this Thursday.  As the soft tissue demarcates we will determine the next treatment options.  Follow-Up Instructions: Return in about 1 week (around 09/07/2021) for Follow-up this Thursday to further evaluate the soft tissue.   Ortho Exam  Patient is alert, oriented, no adenopathy, well-dressed, normal affect, normal respiratory effort. Examination patient has what appears to be a full-thickness soft tissue injury with ischemic soft tissue on the right ankle that is 7 x 9 cm there is pitting weeping edema with dermatitis in the right leg there is no cellulitis.  Patient has brawny edema in the left lower extremity as well with venous stasis ulcer that is 1 cm diameter the left leg has no  cellulitis the ulcer has good granulation tissue.  Patient met also has a 1 cm diameter ulcer on the plantar aspect of the left midfoot beneath the second and third metatarsal heads.  Discussed the importance of the protected weightbearing across the forefoot.  Imaging: No results found. No images are attached to the encounter.  Labs: Lab Results  Component Value Date   HGBA1C 11.8 (H) 08/15/2021   HGBA1C 13.7 (H) 07/10/2021   HGBA1C 9.9 (H) 02/18/2021   ESRSEDRATE 41 (H) 09/16/2017   CRP 8.9 (H) 09/16/2017   REPTSTATUS 07/14/2021 FINAL 07/09/2021   CULT  07/09/2021    NO GROWTH 5 DAYS Performed at [facility], [address], [place], [place] [address]      Lab Results  Component Value Date   ALBUMIN 3.1 (L) 07/17/2021   ALBUMIN 2.7 (L) 07/09/2021   ALBUMIN 3.2 (L) 04/11/2021    Lab Results  Component Value Date   MG 1.6 (L) 07/17/2021   MG 1.5 (L) 07/10/2021   MG 1.6 (L) 04/14/2021   No results found for: VD25OH  No results found for: PREALBUMIN CBC EXTENDED Latest Ref Rng & Units 08/19/2021 08/17/2021 08/16/2021  WBC 4.0 - 10.5 K/uL 9.2 9.9 9.1  RBC 4.22 - 5.81 MIL/uL 4.48 4.36 4.63  HGB 13.0 - 17.0 g/dL 11.9(L) 11.6(L) 12.4(L)  HCT 39.0 -  52.0 % 37.9(L) 36.4(L) 37.9(L)  PLT 150 - 400 K/uL 309 281 267  NEUTROABS 1.7 - 7.7 K/uL - - -  LYMPHSABS 0.7 - 4.0 K/uL - - -     There is no height or weight on file to calculate BMI.  Orders:  No orders of the defined types were placed in this encounter.  No orders of the defined types were placed in this encounter.    Procedures: No procedures performed  Clinical Data: No additional findings.  ROS:  All other systems negative, except as noted in the HPI. Review of Systems  Objective: Vital Signs: There were no vitals taken for this visit.  Specialty Comments:  No specialty comments available.  PMFS History: Patient Active Problem List   Diagnosis Date Noted   Hyperkalemia 08/20/2021   DM (diabetes  mellitus) with peripheral vascular complication ([place]) 06/14/9484   Atypical chest pain 07/17/2021   Hyponatremia 07/17/2021   Leg wound, left 07/17/2021   Essential hypertension 07/17/2021   Acute hyperglycemia 07/09/2021   Left leg cellulitis 04/11/2021   Cellulitis of left leg 03/05/2021   Cellulitis 02/18/2021   PAD (peripheral artery disease) ([place]) 10/10/2020   Osteomyelitis of fourth toe of right foot ([place])    S/P CABG x 4 07/24/2020   Non-ST elevation (NSTEMI) myocardial infarction ([place]) 07/22/2020   Acute on chronic diastolic heart failure ([place]) 07/22/2020   Hypotension 07/18/2020   Left leg swelling 07/18/2020   Elevated troponin 07/18/2020   AKI (acute kidney injury) ([place]) 07/18/2020   Elevated brain natriuretic peptide (BNP) level 07/18/2020   GERD (gastroesophageal reflux disease) 07/18/2020   Severe Sepsis secondary to Lt leg/Foot Cellulitis 07/18/2020   Severe sepsis --Severe Sepsis secondary to Lt leg/Foot Cellulitis with hypotension and AKI 07/18/2020   Unstable angina ([place]) 11/01/2019   Edema 06/13/2019   Leukocytosis 05/31/2019   Smoker 03/08/2019   Chronic pain 03/08/2019   Chronic back pain 02/16/2019   Deep venous thrombosis ([place]) 02/16/2019   Obesity, Class III, BMI 40-49.9 (morbid obesity) ([place]) 02/16/2019   Peripheral nerve disease 02/16/2019   COPD (chronic obstructive pulmonary disease) ([place]) 01/22/2019   Anxiety disorder 03/26/2018   Open wound of toe 10/12/2017   Sinusitis 10/12/2017   Abdominal pain    Loss of weight    Diabetic foot infection ([place]) 09/16/2017   Uncontrolled type 2 diabetes mellitus with hyperglycemia, with long-term current use of insulin ([place]) 09/16/2017   Cellulitis of right foot    Chronic ulcer of great toe of right foot ([place]) 09/09/2017   Recurrent chest pain 07/29/2016   Hyperglycemia    Insulin dependent type 2 diabetes mellitus ([place]) 04/29/2015   OSA (obstructive sleep apnea) 05/08/2013   History of pulmonary embolus  (PE) 04/27/2013   CAD -S/P PCI 11/02/19  09/10/2008   Hyperlipidemia 09/09/2008   Past Medical History:  Diagnosis Date   Anxiety    Arthritis    "knees, shoulders, hips, ankles" (07/29/2016)   Asthma    CAD (coronary artery disease)    a. 2017: s/p BMS to distal Cx; b. LHC 05/30/2019: 80% mid, distal RCA s/p DES, 30% narrowing of d LM, widely patent LAD w/ luminal irregularities, widely patent stent in [place]  w/ 90+% stenosis distal to stent beofre small trifurcating obtuse marginal (potentially area of restenosis)   Chronic bronchitis (HCC)    Chronic ulcer of right great toe ([place]) 09/19/2017   COPD (chronic obstructive pulmonary disease) (HCC)    Depression    DVT (deep  venous thrombosis) (HCC)    GERD (gastroesophageal reflux disease)    Hyperlipidemia    Hypertension    Myocardial infarction ([place]) 1996   "light one"   PE (pulmonary embolism) 04/2013   On chronic Xarelto   Peripheral nerve disease    Pneumonia "several times"   Sleep apnea    Type II diabetes mellitus ([place])     Family History  Problem Relation Age of Onset   Hypertension Brother    Hypertension Father    Diabetes Other    Hyperlipidemia Other     Past Surgical History:  Procedure Laterality Date   ABDOMINAL AORTOGRAM W/LOWER EXTREMITY N/A 10/10/2020   Procedure: ABDOMINAL AORTOGRAM W/LOWER EXTREMITY;  Surgeon: Elam Dutch, MD;  Location: [place] [facility];  Service: Cardiovascular;  Laterality: N/A;   AMPUTATION Right 09/21/2017   Procedure: RIGHT GREAT TOE AMPUTATION, POSSIBLE VAC;  Surgeon: Leandrew Koyanagi, MD;  Location: [place];  Service: Orthopedics;  Laterality: Right;   AMPUTATION Left 08/13/2020   Procedure: LEFT FOOT 4TH RAY AMPUTATION;  Surgeon: Newt Minion, MD;  Location: [place];  Service: Orthopedics;  Laterality: Left;   AORTOGRAM Bilateral 03/13/2021   Procedure: ABDOMINAL AORTOGRAM WITH Left LOWER EXTREMITY RUNOFF;  Surgeon: Cherre Robins, MD;  Location[facility];  Service: Vascular;   Laterality: Bilateral;   CARDIAC CATHETERIZATION  2006   [place]   "@ [facility]; when I had my heart attack"   CARDIAC CATHETERIZATION N/A 07/29/2016   Procedure: Left Heart Cath and Coronary Angiography;  Surgeon: Lorretta Harp, MD;  Location: [place] [facility];  Service: Cardiovascular;  Laterality: N/A;   CARDIAC CATHETERIZATION N/A 07/29/2016   Procedure: Coronary Stent Intervention;  Surgeon: Lorretta Harp, MD;  Location: [place] [facility];  Service: Cardiovascular;  Laterality: N/A;   CARPAL TUNNEL RELEASE Bilateral    CORONARY ANGIOPLASTY WITH STENT PLACEMENT  07/29/2016   CORONARY ARTERY BYPASS GRAFT N/A 07/24/2020   Procedure: CORONARY ARTERY BYPASS GRAFTING (CABG), ON PUMP, TIMES FOUR, USING LEFT INTERNAL MAMMARY ARTERY AND ENDOSCOPICALLY HARVESTED RIGHT GREATER SAPHENOUS VEIN;  Surgeon: Lajuana Matte, MD;  Location: [place];  Service: Open Heart Surgery;  Laterality: N/A;  FLOW TAC   CORONARY STENT INTERVENTION N/A 05/30/2019   Procedure: CORONARY STENT INTERVENTION;  Surgeon: Belva Crome, MD;  Location: [place] [facility];  Service: Cardiovascular;  Laterality: N/A;   CORONARY STENT INTERVENTION N/A 11/02/2019   Procedure: CORONARY STENT INTERVENTION;  Surgeon: Wellington Hampshire, MD;  Location: [place] [facility];  Service: Cardiovascular;  Laterality: N/A;   ESOPHAGOGASTRODUODENOSCOPY N/A 09/22/2017   Procedure: ESOPHAGOGASTRODUODENOSCOPY (EGD);  Surgeon: Ladene Artist, MD;  Location: [facility]OS[facility];  Service: Endoscopy;  Laterality: N/A;   INTRAVASCULAR PRESSURE WIRE/FFR STUDY N/A 07/22/2020   Procedure: INTRAVASCULAR PRESSURE WIRE/FFR STUDY;  Surgeon: Leonie Man, MD;  Location: [place] [facility];  Service: Cardiovascular;  Laterality: N/A;   KNEE ARTHROSCOPY Bilateral    "2 on left; 1 on the right"   LEFT HEART CATH AND CORONARY ANGIOGRAPHY N/A 11/02/2019   Procedure: LEFT HEART CATH AND CORONARY ANGIOGRAPHY;  Surgeon: Wellington Hampshire, MD;   Location: [place] [facility];  Service: Cardiovascular;  Laterality: N/A;   LEFT HEART CATH AND CORONARY ANGIOGRAPHY N/A 07/22/2020   Procedure: LEFT HEART CATH AND CORONARY ANGIOGRAPHY;  Surgeon: Leonie Man, MD;  Location: [place] [facility];  Service: Cardiovascular;  Laterality: N/A;   LEFT HEART CATH AND  CORS/GRAFTS ANGIOGRAPHY N/A 08/17/2021   Procedure: LEFT HEART CATH AND CORS/GRAFTS ANGIOGRAPHY;  Surgeon: Nelva Bush, MD;  Location: [place] [facility];  Service: Cardiovascular;  Laterality: N/A;   PERIPHERAL VASCULAR INTERVENTION Left 10/11/2020   popliteal and SFA stent placement    PERIPHERAL VASCULAR INTERVENTION Left 10/10/2020   Procedure: PERIPHERAL VASCULAR INTERVENTION;  Surgeon: Elam Dutch, MD;  Location: [place] [facility];  Service: Cardiovascular;  Laterality: Left;   RIGHT/LEFT HEART CATH AND CORONARY ANGIOGRAPHY N/A 05/30/2019   Procedure: RIGHT/LEFT HEART CATH AND CORONARY ANGIOGRAPHY;  Surgeon: Belva Crome, MD;  Location: [place] [facility];  Service: Cardiovascular;  Laterality: N/A;   SHOULDER OPEN ROTATOR CUFF REPAIR Bilateral    TEE WITHOUT CARDIOVERSION N/A 07/24/2020   Procedure: TRANSESOPHAGEAL ECHOCARDIOGRAM (TEE);  Surgeon: Lajuana Matte, MD;  Location: [place];  Service: Open Heart Surgery;  Laterality: N/A;   Social History   Occupational History   Occupation: disabled     Fish farm manager: UNEMPLOYED  Tobacco Use   Smoking status: Former    Packs/day: 1.00    Years: 34.00    Pack years: 34.00    Types: Cigarettes    Quit date: 07/12/2020    Years since quitting: 1.1   Smokeless tobacco: Former    Types: Snuff, Chew  Vaping Use   Vaping Use: Never used  Substance and Sexual Activity   Alcohol use: Yes    Comment: RARE   Drug use: No   Sexual activity: Yes

## 2021-09-02 ENCOUNTER — Ambulatory Visit (INDEPENDENT_AMBULATORY_CARE_PROVIDER_SITE_OTHER): Payer: Medicare HMO | Admitting: Cardiovascular Disease

## 2021-09-02 ENCOUNTER — Other Ambulatory Visit: Payer: Self-pay

## 2021-09-02 ENCOUNTER — Encounter (HOSPITAL_BASED_OUTPATIENT_CLINIC_OR_DEPARTMENT_OTHER): Payer: Self-pay | Admitting: Cardiovascular Disease

## 2021-09-02 VITALS — BP 122/84 | HR 81 | Ht 77.0 in | Wt 356.8 lb

## 2021-09-02 DIAGNOSIS — I10 Essential (primary) hypertension: Secondary | ICD-10-CM | POA: Diagnosis not present

## 2021-09-02 DIAGNOSIS — Z5181 Encounter for therapeutic drug level monitoring: Secondary | ICD-10-CM | POA: Diagnosis not present

## 2021-09-02 DIAGNOSIS — I2510 Atherosclerotic heart disease of native coronary artery without angina pectoris: Secondary | ICD-10-CM

## 2021-09-02 DIAGNOSIS — I739 Peripheral vascular disease, unspecified: Secondary | ICD-10-CM

## 2021-09-02 DIAGNOSIS — E782 Mixed hyperlipidemia: Secondary | ICD-10-CM

## 2021-09-02 DIAGNOSIS — Z9861 Coronary angioplasty status: Secondary | ICD-10-CM

## 2021-09-02 DIAGNOSIS — E785 Hyperlipidemia, unspecified: Secondary | ICD-10-CM | POA: Diagnosis not present

## 2021-09-02 DIAGNOSIS — I7121 Aneurysm of the ascending aorta, without rupture: Secondary | ICD-10-CM

## 2021-09-02 DIAGNOSIS — Q2381 Bicuspid aortic valve: Secondary | ICD-10-CM

## 2021-09-02 DIAGNOSIS — I5033 Acute on chronic diastolic (congestive) heart failure: Secondary | ICD-10-CM

## 2021-09-02 DIAGNOSIS — Q231 Congenital insufficiency of aortic valve: Secondary | ICD-10-CM

## 2021-09-02 DIAGNOSIS — I712 Thoracic aortic aneurysm, without rupture: Secondary | ICD-10-CM

## 2021-09-02 DIAGNOSIS — F17200 Nicotine dependence, unspecified, uncomplicated: Secondary | ICD-10-CM

## 2021-09-02 MED ORDER — RANOLAZINE ER 500 MG PO TB12: 500.0000 mg | ORAL_TABLET | Freq: Two times a day (BID) | ORAL | 3 refills | Status: DC

## 2021-09-02 MED ORDER — VASCEPA 1 G PO CAPS: 2.0000 g | ORAL_CAPSULE | Freq: Two times a day (BID) | ORAL | 3 refills | Status: DC

## 2021-09-02 NOTE — Patient Instructions (Addendum)
Medication Instructions:  START RANEXA 500 MG TWICE A DAY   START VASCEPA 1 MG TWO TABLETS TWICE A DAY   STOP FENOFIBRATE   STOP ASPRIN 325   START ASPIRIN 81 MG DAILY   *If you need a refill on your cardiac medications before your next appointment, please call your pharmacy*  Lab Work: FASTING LP/CMET IN 3 MONTHS PRIOR TO YOUR FOLLOW UP   If you have labs (blood work) drawn today and your tests are completely normal, you will receive your results only by: MyChart Message (if you have MyChart) OR A paper copy in the mail If you have any lab test that is abnormal or we need to change your treatment, we will call you to review the results.  Testing/Procedures: Your physician has requested that you have an echocardiogram. Echocardiography is a painless test that uses sound waves to create images of your heart. It provides your doctor with information about the size and shape of your heart and how well your heart's chambers and valves are working. This procedure takes approximately one hour. There are no restrictions for this procedure.  IN 6  MONTHS   Follow-Up: At [facility], you and your health needs are our priority.  As part of our continuing mission to provide you with exceptional heart care, we have created designated Provider Care Teams.  These Care Teams include your primary Cardiologist (physician) and Advanced Practice Providers (APPs -  Physician Assistants and Nurse Practitioners) who all work together to provide you with the care you need, when you need it.  We recommend signing up for the patient portal called "MyChart".  Sign up information is provided on this After Visit Summary.  MyChart is used to connect with patients for Virtual Visits (Telemedicine).  Patients are able to view lab/test results, encounter notes, upcoming appointments, etc.  Non-urgent messages can be sent to your provider as well.   To learn more about what you can do with MyChart, go to  [email or web address].    Your next appointment:   3 month(s)  The format for your next appointment:   In Person  Provider:   Chilton Si, MD

## 2021-09-02 NOTE — Progress Notes (Signed)
Cardiology Office Note   Date:  09/14/2021   ID:  Joseph [name], DOB 07-May-1971, MRN [id number]  PCP:  Marva Panda, NP  Cardiologist:   Chilton Si, MD   No chief complaint on file.    History of Present Illness: Joseph [name] is a 50 y.o. male with CAD s/p LCx PCI 07/2016,  HTN, HL, DM Type II (A1c 7.1 07/2015), bicuspid aortic valve, ascending aorta aneurysm, prior PE, and OSA who presents for follow up.  He was was first seen 07/2015 for pre-operative clearance prior to right knee and left hip surgery.  At that time he was feeling well but was unable to exercise due to orthopedic limitations, so he was referred for [facility] 08/12/15 that revealed LVEF 50% and a small area in the basal inferolateral region thought to be either soft tissue attenuation vs prior infarct with mild peri-infarct ischemia.  He was seen in the ED 07/2016 with unstable angina.  He underwent LHC 07/29/16 and was found to have an 80% LCx lesion that was successfully stented.  He followed up with Azalee Course, PA-C on 09/06/16, At which time he continued to report episodes of chest pain. He was referred for stress test but has not yet completed this. He reports that his son has kidney stones and he and his wife have been busy taking him to his appointments and procedures. He continues to have episodes of exertional chest discomfort. He also notes that when he is angry or upset. He responds after 2 or 3 nitroglycerin tablets. The pain is substernal and on the left side of his chest. It does not radiate. There is associated shortness of breath and nausea, though he is nauseous at baseline. He also endorses diaphoresis, though he states that this is a chronic issue as well.  When he has chest pain he feels as though he can't take a deep breath.  He was seen at [facility] for chest pain and hypotension on 06/2020.  Echo at that time revealed LVEF 50 to 55% with normal diastolic function.  He underwent coronary  artery bypass grafting (LIMA-->LAD, SVG-->PDA, OM1, D1) 07/2020.  He had a partial foot amputation later in the month.  He sees Dr. Darrick Penna and had an abdominal aortogram and LE runoff on 09/2020.  He was found to have multisegment stenosis of the left popliteal artery and left SFA.  Stents were successfully placed in these regions.  In the right leg there were 50 to 70% stenoses in the right SFA with one-vessel runoff in the posterior tibial.  He had 50 to 90% stenoses in the left SFA and 2 segments of 90% stenosis in the popliteal.  These regions were also dilated and stented.  There was a dissection that was managed with further stenting.  He was started on Lasix due to lower extremity edema. At the last appointment he continued to smoke. He had chest pain that was not thought to be ischemic.  Since his last appointment he was admitted 07/2021 with chest pain.  High-sensitivity troponin was elevated but flat and there are no ischemic changes on EKG.  Echo that admission revealed LVEF 55% with no wall motion abnormality and mild LVH.  His ascending aorta was 4.2 cm.  He underwent left heart cath which showed severe native vessel disease with functional occlusion of the mid LAD and mid left circumflex with competitive flow from his bypass grafts.  He had 70% distal left main and multifocal 70%  disease in the RCA.  His LIMA to the LAD, SVG to D3, SVG to OM 2 and SVG to RPDA were patent.  Filling pressures were elevated (LVEDP 30 mmHg).  He had no PCI targets and was diuresed.  Diabetes was poorly controlled with hemoglobin A1c 11.8%.  Today, he states that he is not feeling well. He is accompanied by his wife and son. Chest pain correlating with a blood pressure of 200/118 prompted his recent hospital visit. Since then, he has chest pain and pressure daily while sitting. Associated symptoms include heavy breathing and shortness of breath. At home his blood pressure is typically stable and well-controlled lately.  After his CABG x4 he had severe cognitive issues. This has persisted and worsened until the present day. He endorses neuropathy, as well as bilateral numbness and tingling which may be worse on one side and alternate to another side the next day. Usually his numbness and tingling is worse on his left side. While walking he is unsteady on his feet. Also, he has some pain near his sternum from a broken surgical blade during his CABG x4. He denies any palpitations. No lightheadedness, syncope, orthopnea, or PND.    Past Medical History:  Diagnosis Date   Aneurysm of ascending aorta (HCC) 09/14/2021   Anxiety    Arthritis    "knees, shoulders, hips, ankles" (07/29/2016)   Asthma    Bicuspid aortic valve 09/14/2021   CAD (coronary artery disease)    a. 2017: s/p BMS to distal Cx; b. LHC 05/30/2019: 80% mid, distal RCA s/p DES, 30% narrowing of d LM, widely patent LAD w/ luminal irregularities, widely patent stent in dCX  w/ 90+% stenosis distal to stent beofre small trifurcating obtuse marginal (potentially area of restenosis)   Chronic bronchitis (HCC)    Chronic ulcer of right great toe (HCC) 09/19/2017   COPD (chronic obstructive pulmonary disease) (HCC)    Depression    DVT (deep venous thrombosis) (HCC)    GERD (gastroesophageal reflux disease)    Hyperlipidemia    Hypertension    Myocardial infarction (HCC) 1996   "light one"   PE (pulmonary embolism) 04/2013   On chronic Xarelto   Peripheral nerve disease    Pneumonia "several times"   Sleep apnea    Type II diabetes mellitus (HCC)     Past Surgical History:  Procedure Laterality Date   ABDOMINAL AORTOGRAM W/LOWER EXTREMITY N/A 10/10/2020   Procedure: ABDOMINAL AORTOGRAM W/LOWER EXTREMITY;  Surgeon: Sherren Kerns, MD;  Location: [facility];  Service: Cardiovascular;  Laterality: N/A;   AMPUTATION Right 09/21/2017   Procedure: RIGHT GREAT TOE AMPUTATION, POSSIBLE VAC;  Surgeon: Tarry Kos, MD;  Location: [facility] OR;   Service: Orthopedics;  Laterality: Right;   AMPUTATION Left 08/13/2020   Procedure: LEFT FOOT 4TH RAY AMPUTATION;  Surgeon: [name], MD;  Location: [facility] OR;  Service: Orthopedics;  Laterality: Left;   AORTOGRAM Bilateral 03/13/2021   Procedure: ABDOMINAL AORTOGRAM WITH Left LOWER EXTREMITY RUNOFF;  Surgeon: Leonie Douglas, MD;  Location: [facility] OR;  Service: Vascular;  Laterality: Bilateral;   CARDIAC CATHETERIZATION  2006   CARDIAC CATHETERIZATION  1996   "@ [facility]; when I had my heart attack"   CARDIAC CATHETERIZATION N/A 07/29/2016   Procedure: Left Heart Cath and Coronary Angiography;  Surgeon: Runell Gess, MD;  Location: [facility] INVASIVE CV LAB;  Service: Cardiovascular;  Laterality: N/A;   CARDIAC CATHETERIZATION N/A 07/29/2016   Procedure: Coronary Stent Intervention;  Surgeon: Runell Gess, MD;  Location: [facility] INVASIVE CV LAB;  Service: Cardiovascular;  Laterality: N/A;   CARPAL TUNNEL RELEASE Bilateral    CORONARY ANGIOPLASTY WITH STENT PLACEMENT  07/29/2016   CORONARY ARTERY BYPASS GRAFT N/A 07/24/2020   Procedure: CORONARY ARTERY BYPASS GRAFTING (CABG), ON PUMP, TIMES FOUR, USING LEFT INTERNAL MAMMARY ARTERY AND ENDOSCOPICALLY HARVESTED RIGHT GREATER SAPHENOUS VEIN;  Surgeon: Corliss Skains, MD;  Location: [facility] OR;  Service: Open Heart Surgery;  Laterality: N/A;  FLOW TAC   CORONARY STENT INTERVENTION N/A 05/30/2019   Procedure: CORONARY STENT INTERVENTION;  Surgeon: Lyn Records, MD;  Location: [facility];  Service: Cardiovascular;  Laterality: N/A;   CORONARY STENT INTERVENTION N/A 11/02/2019   Procedure: CORONARY STENT INTERVENTION;  Surgeon: Iran Ouch, MD;  Location: [facility];  Service: Cardiovascular;  Laterality: N/A;   ESOPHAGOGASTRODUODENOSCOPY N/A 09/22/2017   Procedure: ESOPHAGOGASTRODUODENOSCOPY (EGD);  Surgeon: Meryl Dare, MD;  Location: [facility] ENDOSCOPY;  Service: Endoscopy;  Laterality: N/A;   INTRAVASCULAR PRESSURE WIRE/FFR STUDY N/A  07/22/2020   Procedure: INTRAVASCULAR PRESSURE WIRE/FFR STUDY;  Surgeon: Marykay Lex, MD;  Location: [facility] INVASIVE CV LAB;  Service: Cardiovascular;  Laterality: N/A;   KNEE ARTHROSCOPY Bilateral    "2 on left; 1 on the right"   LEFT HEART CATH AND CORONARY ANGIOGRAPHY N/A 11/02/2019   Procedure: LEFT HEART CATH AND CORONARY ANGIOGRAPHY;  Surgeon: Iran Ouch, MD;  Location: [facility];  Service: Cardiovascular;  Laterality: N/A;   LEFT HEART CATH AND CORONARY ANGIOGRAPHY N/A 07/22/2020   Procedure: LEFT HEART CATH AND CORONARY ANGIOGRAPHY;  Surgeon: Marykay Lex, MD;  Location: [facility] INVASIVE CV LAB;  Service: Cardiovascular;  Laterality: N/A;   LEFT HEART CATH AND CORS/GRAFTS ANGIOGRAPHY N/A 08/17/2021   Procedure: LEFT HEART CATH AND CORS/GRAFTS ANGIOGRAPHY;  Surgeon: Yvonne Kendall, MD;  Location: [facility];  Service: Cardiovascular;  Laterality: N/A;   PERIPHERAL VASCULAR INTERVENTION Left 10/11/2020   popliteal and SFA stent placement    PERIPHERAL VASCULAR INTERVENTION Left 10/10/2020   Procedure: PERIPHERAL VASCULAR INTERVENTION;  Surgeon: Sherren Kerns, MD;  Location: [facility];  Service: Cardiovascular;  Laterality: Left;   RIGHT/LEFT HEART CATH AND CORONARY ANGIOGRAPHY N/A 05/30/2019   Procedure: RIGHT/LEFT HEART CATH AND CORONARY ANGIOGRAPHY;  Surgeon: Lyn Records, MD;  Location: [facility];  Service: Cardiovascular;  Laterality: N/A;   SHOULDER OPEN ROTATOR CUFF REPAIR Bilateral    TEE WITHOUT CARDIOVERSION N/A 07/24/2020   Procedure: TRANSESOPHAGEAL ECHOCARDIOGRAM (TEE);  Surgeon: Corliss Skains, MD;  Location: [facility] OR;  Service: Open Heart Surgery;  Laterality: N/A;     Current Outpatient Medications  Medication Sig Dispense Refill   albuterol (VENTOLIN HFA) 108 (90 Base) MCG/ACT inhaler Inhale 2 puffs into the lungs every 6 (six) hours as needed for wheezing or shortness of breath.     ALPRAZolam (XANAX) 1 MG tablet Take 1 mg  by mouth See admin instructions. 1 tablet by mouth twice daily and 2 tabs at bedtime.     aspirin EC 81 MG tablet Take 81 mg by mouth daily. Swallow whole.     atorvastatin (LIPITOR) 80 MG tablet Take 1 tablet (80 mg total) by mouth daily at 6 PM. 30 tablet 2   buPROPion (WELLBUTRIN SR) 150 MG 12 hr tablet Take 150 mg by mouth 2 (two) times daily.   0   carvedilol (COREG) 12.5 MG tablet Take 1 tablet (12.5 mg  total) by mouth 2 (two) times daily. 60 tablet 2   clopidogrel (PLAVIX) 75 MG tablet Take 1 tablet (75 mg total) by mouth daily with breakfast. 30 tablet 3   escitalopram (LEXAPRO) 10 MG tablet Take 10 mg by mouth in the morning.     fluconazole (DIFLUCAN) 100 MG tablet Take 100 mg by mouth once a week.     furosemide (LASIX) 40 MG tablet Take 1 tablet (40 mg total) by mouth daily. 90 tablet 3   icosapent Ethyl (VASCEPA) 1 g capsule Take 2 capsules (2 g total) by mouth 2 (two) times daily. 360 capsule 3   insulin aspart (NOVOLOG) 100 UNIT/ML FlexPen Inject 12 Units into the skin 3 (three) times daily with meals. 15 mL 3   insulin glargine (LANTUS) 100 UNIT/ML injection Inject 60 Units into the skin 2 (two) times daily.     isosorbide mononitrate (IMDUR) 60 MG 24 hr tablet Take 2 tablets (120 mg total) by mouth daily. 120 tablet 2   levofloxacin (LEVAQUIN) 750 MG tablet levofloxacin 750 mg tablet  Take 1 tablet every day by oral route for 10 days.     liraglutide (VICTOZA) 18 MG/3ML SOPN Inject 1.8 mg into the skin daily. 3 mL 0   meclizine (ANTIVERT) 25 MG tablet Take 50 mg by mouth daily with breakfast.     meclizine (ANTIVERT) 25 MG tablet Take 25 mg by mouth at bedtime.     metFORMIN (GLUCOPHAGE) 1000 MG tablet Take 1 tablet (1,000 mg total) by mouth 2 (two) times daily with a meal.     mupirocin ointment (BACTROBAN) 2 % Place 1 application into the nose 2 (two) times daily. 22 g 0   mupirocin ointment (BACTROBAN) 2 % Apply topically 2 (two) times daily. 22 g 0   naloxone (NARCAN) nasal  spray 4 mg/0.1 mL Place 4 mg into the nose as needed (overdose).     nitroGLYCERIN (NITROSTAT) 0.4 MG SL tablet Place 0.4 mg under the tongue every 5 (five) minutes as needed for chest pain.     oxyCODONE (ROXICODONE) 15 MG immediate release tablet Take 1 tablet (15 mg total) by mouth 5 (five) times daily. 25 tablet 0   pantoprazole (PROTONIX) 40 MG tablet Take 1 tablet (40 mg total) by mouth daily. 30 tablet 0   pregabalin (LYRICA) 150 MG capsule Take 300 mg by mouth in the morning.     pregabalin (LYRICA) 150 MG capsule Take 150 mg by mouth at bedtime.     ranolazine (RANEXA) 500 MG 12 hr tablet Take 1 tablet (500 mg total) by mouth 2 (two) times daily. 180 tablet 3   rosuvastatin (CRESTOR) 40 MG tablet Take 1 tablet (40 mg total) by mouth every evening. 60 tablet 2   No current facility-administered medications for this visit.    Allergies:   Sulfa antibiotics    Social History:  The patient  reports that he quit smoking about 14 months ago. His smoking use included cigarettes. He has a 34.00 pack-year smoking history. He has quit using smokeless tobacco.  His smokeless tobacco use included snuff and chew. He reports current alcohol use. He reports that he does not use drugs.   Family History:  The patient's family history includes Diabetes in an other family member; Hyperlipidemia in an other family member; Hypertension in his brother and father.    ROS:   Please see the history of present illness. (+) Chest pain (+) Shortness of breath (+) Gait instability (+)  Cognitive impairment (+) Bilateral numbness, tingling (+) Neuropathy All other systems are reviewed and negative.    PHYSICAL EXAM: VS:  BP 122/84   Pulse 81   Ht 6\' 5"  (1.956 m)   Wt (!) 356 lb 12.8 oz (161.8 kg)   SpO2 96%   BMI 42.31 kg/m  , BMI Body mass index is 42.31 kg/m. GENERAL:  Well appearing.  No acute distress HEENT:  Pupils equal round and reactive, fundi not visualized, oral mucosa  unremarkable NECK:  No jugular venous distention, waveform within normal limits, carotid upstroke brisk and symmetric CHEST: Well-healed median sternotomy LUNGS:  Clear to auscultation bilaterally HEART:  RRR.  PMI not displaced or sustained,S1 and S2 within normal limits, no S3, no S4, no clicks, no rubs, no murmurs ABD:  Flat, positive bowel sounds normal in frequency in pitch, no bruits, no rebound, no guarding, no midline pulsatile mass, no hepatomegaly, no splenomegaly EXT:  2 plus pulses throughout, 1+ LE edema, no cyanosis no clubbing.  Partial L foot amputation SKIN:  No rashes no nodules NEURO:  Cranial nerves II through XII grossly intact, motor grossly intact throughout PSYCH:  Cognitively intact, oriented to person place and time  EKG:   09/02/2021: EKG is not ordered today. 11/20/20: Sinus rhythm.  Rate 97 bpm.  Low voltage.  Prior inferior infarct.  Prior anteroseptal infarct.  LAFB.  09/29/16: Sinus rhythm. Rate 99 bpm. Left axis deviation. Prior inferior infarct. Prior anterior infarct. 08/27/16: sinus tachycardia at 110 bpm.  Absent R wave progression.  Non-specific ST elevation V2.  Prior inferior MI.  LHC 08/17/2021: Conclusions: Severe native coronary artery disease, as detailed below, including functional occlusions of the mid LAD and mid LCx with competitive flow from bypass grafts, 70% distal LMCA stenosis, and multifocal RCA disease of up to 70%. Widely patent LIMA-LAD, SVG-D3, and SVG-OM2. Patent SVG-RPDA with 30% proximal/mid graft stenosis. Moderately elevated left heart filling pressure with significant respiratory variation (LVEDP 30 mmHg end-expiratory, 20-87mmHg mean).   Recommendations: No targets for PCI evident on today's catheterization.  Optimize medical therapy; will add isosorbide mononitrate in place of nitroglycerin infusion. Diuresis given elevated LVEDP. Aggressive secondary prevention.  Echo 08/16/2021:  1. Left ventricular ejection fraction, by  estimation, is 55%. The left  ventricle has normal function. The left ventricle has no regional wall  motion abnormalities. There is mild concentric left ventricular  hypertrophy. Left ventricular diastolic  parameters are consistent with Grade I diastolic dysfunction (impaired  relaxation).   2. Right ventricular systolic function is low normal. The right  ventricular size is normal.   3. The mitral valve is normal in structure. No evidence of mitral valve  regurgitation.   4. The aortic valve is abnormal. There is mild calcification of the  aortic valve. There is mild thickening of the aortic valve. Aortic valve  regurgitation is not visualized.   5. Aortic dilatation noted. There is mild dilatation of the aortic root,  measuring 42 mm. There is mild dilatation of the ascending aorta,  measuring 43 mm.   Comparison(s): A prior study was performed on 07/24/2020. Reviewed prior  studies in series: Aortic Valve is consistent with a Bicuspid Aortic Valve  (Sievers's Class 1 R-L fusion).   LE Duplex 03/06/2021: Summary:  Left: Patent left superficial femoral and popliteal stents with 50-74%  stensois involving the distal SFA segment.   ABI 03/05/2021: Summary:  Right: Resting right ankle-brachial index is within normal range. No  evidence of significant right lower extremity  arterial disease.   Left: The left toe-brachial index is abnormal.   Unable to obtain pressures secondary to open wound.  Echo 07/30/16: Study Conclusions   - Left ventricle: The cavity size was normal. Wall thickness was   increased in a pattern of mild LVH. Systolic function was normal.   The estimated ejection fraction was in the range of 55% to 60%.   Wall motion was normal; there were no regional wall motion   abnormalities. Doppler parameters are consistent with abnormal   left ventricular relaxation (grade 1 diastolic dysfunction). - Aortic root: The aortic root was mildly dilated.   Impressions:    - Technically difficult; definity used; normal LV systolic   function; grade 1 diastolic dysfunction; indeterminant filling   pressure; fusion of left and right aortic cusps; no AS or AI by   doppler; mildly dilated aortic root.   LHC 07/29/16: Dist Cx lesion, 80 %stenosed. Post intervention, there is a 0% residual stenosis. A stent was successfully placed.   Recent Labs: 11/20/2020: BNP 77.9 07/17/2021: ALT 24; Magnesium 1.6 08/19/2021: Hemoglobin 11.9; Platelets 309 08/20/2021: BUN 38; Creatinine, Ser 1.68; Potassium 4.6; Sodium 136    Lipid Panel    Component Value Date/Time   CHOL 189 08/16/2021 0312   TRIG 316 (H) 08/16/2021 0312   HDL 31 (L) 08/16/2021 0312   CHOLHDL 6.1 08/16/2021 0312   VLDL 63 (H) 08/16/2021 0312   LDLCALC 95 08/16/2021 0312   LDLDIRECT 120.0 05/28/2015 0951      Wt Readings from Last 3 Encounters:  09/02/21 (!) 356 lb 12.8 oz (161.8 kg)  08/20/21 (!) 360 lb 11.2 oz (163.6 kg)  07/16/21 (!) 373 lb 9.6 oz (169.5 kg)      Other studies Reviewed: Additional studies/ records that were reviewed today include:  Review of the above records demonstrates:  Please see elsewhere in the note.    ASSESSMENT AND PLAN: CAD -S/P PCI 11/02/19  Mr. Librizzi presented with chest pain in the setting of acute on chronic diastolic heart failure and hyperglycemia.  Left heart cath last month revealed severe native vessel disease with patent grafts.  He continues to have chest pain.  Is not clear if this is angina.  We will try adding Ranexa 500 mg every 12 hours to see if that helps.  It will be very important to continue working on improved glycemic control.  Continue aspirin, atorvastatin, carvedilol, clopidogrel, and Imdur.  We will add Vascepa for poorly controlled triglycerides, though this is also likely attributable to his poor glucose control.  He has been taking aspirin 325 mg.  He was advised to switch this to 81 mg.  Bicuspid aortic valve His intraoperative  TEE and most recent echocardiogram were reviewed.  He does, in fact have a bicuspid aortic valve.  There is fusion of the right and left coronary cusps.  Gradients are not elevated.  Aneurysm of ascending aorta (HCC) 4.2 cm on his most recent echocardiogram.  Repeat in 6 months.  Essential hypertension Blood pressures well-controlled.  Continue carvedilol and Imdur.  Consider ARB given his diabetes.  Hyperlipidemia Continue rosuvastatin and adding Vascepa for hypertriglyceridemia.  Smoker Continue to advise cessation.  He is not interested in quitting at this time and would not like assistance.  PAD (peripheral artery disease) (HCC) Status post peripheral stenting.  He follows with vascular.  Adding Vascepa as above.  Reducing aspirin to 81 mg.  He currently denies claudication.   Current medicines are reviewed at length  with the patient today.  The patient does not have concerns regarding medicines.  The following changes have been made:  Start Ranexa and Vascepa  Labs/ tests ordered today include:  Orders Placed This Encounter  Procedures   Lipid panel   Comprehensive metabolic panel   ECHOCARDIOGRAM COMPLETE      Disposition:   FU with Dr. Elmarie Shiley C. [place] in 2-3 months   I,Mathew Stumpf,acting as a Neurosurgeon for [employer], MD.,have documented all relevant documentation on the behalf of Chilton Si, MD,as directed by  Chilton Si, MD while in the presence of Chilton Si, MD.  I, Dandrae Kustra C. [facility], MD have reviewed all documentation for this visit.  The documentation of the exam, diagnosis, procedures, and orders on 09/14/2021 are all accurate and complete.   Signed, Chilton Si, MD  09/14/2021 6:16 PM    [facility]

## 2021-09-03 ENCOUNTER — Ambulatory Visit (INDEPENDENT_AMBULATORY_CARE_PROVIDER_SITE_OTHER): Payer: Medicare HMO | Admitting: Orthopedic Surgery

## 2021-09-03 DIAGNOSIS — T25311A Burn of third degree of right ankle, initial encounter: Secondary | ICD-10-CM | POA: Diagnosis not present

## 2021-09-07 ENCOUNTER — Encounter: Payer: Self-pay | Admitting: Orthopedic Surgery

## 2021-09-07 NOTE — Progress Notes (Signed)
Office Visit Note   Patient: Joseph [name]           Date of Birth: Sep 07, 1971           MRN: [id number] Visit Date: 09/03/2021              Requested by: Marva Panda, NP [address] [place],  [name] [address] PCP: Marva Panda, NP  Chief Complaint  Patient presents with   Left Leg - Follow-up   Right Leg - Follow-up      HPI: Patient is a 50 year old gentleman who presents today for follow-up for venous insufficiency bilateral lower extremities as well as a burn full-thickness ulcer to the right ankle.  Patient has previously been in compression wraps.  Assessment & Plan: Visit Diagnoses:  1. Full thickness burn of right ankle, initial encounter     Plan: Patient's legs are showing an improvement we will continue the compression wrap and then advance to compression socks.  Patient may require surgical intervention depending on the resolution of the full-thickness burn right ankle.  Follow-Up Instructions: Return in about 1 week (around 09/10/2021).   Ortho Exam  Patient is alert, oriented, no adenopathy, well-dressed, normal affect, normal respiratory effort. Examination patient has a full-thickness burn over the medial aspect of the right ankle the swelling has decreased significantly in both legs there is no ascending cellulitis no odor no drainage.  The depth of the burn appears to be full-thickness skin.  Imaging: No results found.    Labs: Lab Results  Component Value Date   HGBA1C 11.8 (H) 08/15/2021   HGBA1C 13.7 (H) 07/10/2021   HGBA1C 9.9 (H) 02/18/2021   ESRSEDRATE 41 (H) 09/16/2017   CRP 8.9 (H) 09/16/2017   REPTSTATUS 07/14/2021 FINAL 07/09/2021   CULT  07/09/2021    NO GROWTH 5 DAYS Performed at [facility], [address], [place], [name] [address]      Lab Results  Component Value Date   ALBUMIN 3.1 (L) 07/17/2021   ALBUMIN 2.7 (L) 07/09/2021   ALBUMIN 3.2 (L) 04/11/2021    Lab Results  Component Value Date   MG  1.6 (L) 07/17/2021   MG 1.5 (L) 07/10/2021   MG 1.6 (L) 04/14/2021   No results found for: VD25OH  No results found for: PREALBUMIN CBC EXTENDED Latest Ref Rng & Units 08/19/2021 08/17/2021 08/16/2021  WBC 4.0 - 10.5 K/uL 9.2 9.9 9.1  RBC 4.22 - 5.81 MIL/uL 4.48 4.36 4.63  HGB 13.0 - 17.0 g/dL 11.9(L) 11.6(L) 12.4(L)  HCT 39.0 - 52.0 % 37.9(L) 36.4(L) 37.9(L)  PLT 150 - 400 K/uL 309 281 267  NEUTROABS 1.7 - 7.7 K/uL - - -  LYMPHSABS 0.7 - 4.0 K/uL - - -     There is no height or weight on file to calculate BMI.  Orders:  No orders of the defined types were placed in this encounter.  No orders of the defined types were placed in this encounter.    Procedures: No procedures performed  Clinical Data: No additional findings.  ROS:  All other systems negative, except as noted in the HPI. Review of Systems  Objective: Vital Signs: There were no vitals taken for this visit.  Specialty Comments:  No specialty comments available.  PMFS History: Patient Active Problem List   Diagnosis Date Noted   Hyperkalemia 08/20/2021   DM (diabetes mellitus) with peripheral vascular complication (HCC) 08/20/2021   Atypical chest pain 07/17/2021   Hyponatremia 07/17/2021   Leg  wound, left 07/17/2021   Essential hypertension 07/17/2021   Acute hyperglycemia 07/09/2021   Left leg cellulitis 04/11/2021   Cellulitis of left leg 03/05/2021   Cellulitis 02/18/2021   PAD (peripheral artery disease) (HCC) 10/10/2020   Osteomyelitis of fourth toe of right foot (HCC)    S/P CABG x 4 07/24/2020   Non-ST elevation (NSTEMI) myocardial infarction (HCC) 07/22/2020   Acute on chronic diastolic heart failure (HCC) 07/22/2020   Hypotension 07/18/2020   Left leg swelling 07/18/2020   Elevated troponin 07/18/2020   AKI (acute kidney injury) (HCC) 07/18/2020   Elevated brain natriuretic peptide (BNP) level 07/18/2020   GERD (gastroesophageal reflux disease) 07/18/2020   Severe Sepsis secondary to  Lt leg/Foot Cellulitis 07/18/2020   Severe sepsis --Severe Sepsis secondary to Lt leg/Foot Cellulitis with hypotension and AKI 07/18/2020   Unstable angina (HCC) 11/01/2019   Edema 06/13/2019   Leukocytosis 05/31/2019   Smoker 03/08/2019   Chronic pain 03/08/2019   Chronic back pain 02/16/2019   Deep venous thrombosis (HCC) 02/16/2019   Obesity, Class III, BMI 40-49.9 (morbid obesity) (HCC) 02/16/2019   Peripheral nerve disease 02/16/2019   COPD (chronic obstructive pulmonary disease) (HCC) 01/22/2019   Anxiety disorder 03/26/2018   Open wound of toe 10/12/2017   Sinusitis 10/12/2017   Abdominal pain    Loss of weight    Diabetic foot infection (HCC) 09/16/2017   Uncontrolled type 2 diabetes mellitus with hyperglycemia, with long-term current use of insulin (HCC) 09/16/2017   Cellulitis of right foot    Chronic ulcer of great toe of right foot (HCC) 09/09/2017   Recurrent chest pain 07/29/2016   Hyperglycemia    Insulin dependent type 2 diabetes mellitus (HCC) 04/29/2015   OSA (obstructive sleep apnea) 05/08/2013   History of pulmonary embolus (PE) 04/27/2013   CAD -S/P PCI 11/02/19  09/10/2008   Hyperlipidemia 09/09/2008   Past Medical History:  Diagnosis Date   Anxiety    Arthritis    "knees, shoulders, hips, ankles" (07/29/2016)   Asthma    CAD (coronary artery disease)    a. 2017: s/p BMS to distal Cx; b. LHC 05/30/2019: 80% mid, distal RCA s/p DES, 30% narrowing of d LM, widely patent LAD w/ luminal irregularities, widely patent stent in dCX  w/ 90+% stenosis distal to stent beofre small trifurcating obtuse marginal (potentially area of restenosis)   Chronic bronchitis (HCC)    Chronic ulcer of right great toe (HCC) 09/19/2017   COPD (chronic obstructive pulmonary disease) (HCC)    Depression    DVT (deep venous thrombosis) (HCC)    GERD (gastroesophageal reflux disease)    Hyperlipidemia    Hypertension    Myocardial infarction (HCC) 1996   "light one"   PE  (pulmonary embolism) 04/2013   On chronic Xarelto   Peripheral nerve disease    Pneumonia "several times"   Sleep apnea    Type II diabetes mellitus (HCC)     Family History  Problem Relation Age of Onset   Hypertension Brother    Hypertension Father    Diabetes Other    Hyperlipidemia Other     Past Surgical History:  Procedure Laterality Date   ABDOMINAL AORTOGRAM W/LOWER EXTREMITY N/A 10/10/2020   Procedure: ABDOMINAL AORTOGRAM W/LOWER EXTREMITY;  Surgeon: Sherren Kerns, MD;  [facility];  Service: Cardiovascular;  Laterality: N/A;   AMPUTATION Right 09/21/2017   Procedure: RIGHT GREAT TOE AMPUTATION, POSSIBLE VAC;  Surgeon: Tarry Kos, MD;  Location: [facility] OR;  Service: Orthopedics;  Laterality: Right;   AMPUTATION Left 08/13/2020   Procedure: LEFT FOOT 4TH RAY AMPUTATION;  Surgeon: [name], MD;  Location: [facility] OR;  Service: Orthopedics;  Laterality: Left;   AORTOGRAM Bilateral 03/13/2021   Procedure: ABDOMINAL AORTOGRAM WITH Left LOWER EXTREMITY RUNOFF;  Surgeon: Leonie Douglas, MD;  Location: [facility] OR;  Service: Vascular;  Laterality: Bilateral;   CARDIAC CATHETERIZATION  2006   CARDIAC CATHETERIZATION  1996   "@ [facility]; when I had my heart attack"   CARDIAC CATHETERIZATION N/A 07/29/2016   Procedure: Left Heart Cath and Coronary Angiography;  Surgeon: Runell Gess, MD;  Location: [facility] INVASIVE CV LAB;  Service: Cardiovascular;  Laterality: N/A;   CARDIAC CATHETERIZATION N/A 07/29/2016   Procedure: Coronary Stent Intervention;  Surgeon: Runell Gess, MD;  [facility];  Service: Cardiovascular;  Laterality: N/A;   CARPAL TUNNEL RELEASE Bilateral    CORONARY ANGIOPLASTY WITH STENT PLACEMENT  07/29/2016   CORONARY ARTERY BYPASS GRAFT N/A 07/24/2020   Procedure: CORONARY ARTERY BYPASS GRAFTING (CABG), ON PUMP, TIMES FOUR, USING LEFT INTERNAL MAMMARY ARTERY AND ENDOSCOPICALLY HARVESTED RIGHT GREATER SAPHENOUS VEIN;  Surgeon: Corliss Skains, MD;  Location[facility] OR;  Service: Open Heart Surgery;  Laterality: N/A;  FLOW TAC   CORONARY STENT INTERVENTION N/A 05/30/2019   Procedure: CORONARY STENT INTERVENTION;  Surgeon: Lyn Records, MD;  [facility];  Service: Cardiovascular;  Laterality: N/A;   CORONARY STENT INTERVENTION N/A 11/02/2019   Procedure: CORONARY STENT INTERVENTION;  Surgeon: Iran Ouch, MD;  [facility];  Service: Cardiovascular;  Laterality: N/A;   ESOPHAGOGASTRODUODENOSCOPY N/A 09/22/2017   Procedure: ESOPHAGOGASTRODUODENOSCOPY (EGD);  Surgeon: Meryl Dare, MD;  Location: [facility] ENDOSCOPY;  Service: Endoscopy;  Laterality: N/A;   INTRAVASCULAR PRESSURE WIRE/FFR STUDY N/A 07/22/2020   Procedure: INTRAVASCULAR PRESSURE WIRE/FFR STUDY;  Surgeon: Marykay Lex, MD;  Location: [facility] INVASIVE CV LAB;  Service: Cardiovascular;  Laterality: N/A;   KNEE ARTHROSCOPY Bilateral    "2 on left; 1 on the right"   LEFT HEART CATH AND CORONARY ANGIOGRAPHY N/A 11/02/2019   Procedure: LEFT HEART CATH AND CORONARY ANGIOGRAPHY;  Surgeon: Iran Ouch, MD;  [facility];  Service: Cardiovascular;  Laterality: N/A;   LEFT HEART CATH AND CORONARY ANGIOGRAPHY N/A 07/22/2020   Procedure: LEFT HEART CATH AND CORONARY ANGIOGRAPHY;  Surgeon: Marykay Lex, MD;  Location: [facility] INVASIVE CV LAB;  Service: Cardiovascular;  Laterality: N/A;   LEFT HEART CATH AND CORS/GRAFTS ANGIOGRAPHY N/A 08/17/2021   Procedure: LEFT HEART CATH AND CORS/GRAFTS ANGIOGRAPHY;  Surgeon: Yvonne Kendall, MD;  [facility];  Service: Cardiovascular;  Laterality: N/A;   PERIPHERAL VASCULAR INTERVENTION Left 10/11/2020   popliteal and SFA stent placement    PERIPHERAL VASCULAR INTERVENTION Left 10/10/2020   Procedure: PERIPHERAL VASCULAR INTERVENTION;  Surgeon: Sherren Kerns, MD;  [facility];  Service: Cardiovascular;  Laterality: Left;   RIGHT/LEFT HEART CATH AND CORONARY  ANGIOGRAPHY N/A 05/30/2019   Procedure: RIGHT/LEFT HEART CATH AND CORONARY ANGIOGRAPHY;  Surgeon: Lyn Records, MD;  [facility];  Service: Cardiovascular;  Laterality: N/A;   SHOULDER OPEN ROTATOR CUFF REPAIR Bilateral    TEE WITHOUT CARDIOVERSION N/A 07/24/2020   Procedure: TRANSESOPHAGEAL ECHOCARDIOGRAM (TEE);  Surgeon: Corliss Skains, MD;  Location[facility] OR;  Service: Open Heart Surgery;  Laterality: N/A;   Social History   Occupational History  Occupation: disabled     Associate Professor: UNEMPLOYED  Tobacco Use   Smoking status: Former    Packs/day: 1.00    Years: 34.00    Pack years: 34.00    Types: Cigarettes    Quit date: 07/12/2020    Years since quitting: 1.1   Smokeless tobacco: Former    Types: Snuff, Chew  Vaping Use   Vaping Use: Never used  Substance and Sexual Activity   Alcohol use: Yes    Comment: RARE   Drug use: No   Sexual activity: Yes

## 2021-09-10 ENCOUNTER — Ambulatory Visit (INDEPENDENT_AMBULATORY_CARE_PROVIDER_SITE_OTHER): Payer: Medicare HMO | Admitting: Orthopedic Surgery

## 2021-09-10 DIAGNOSIS — I87332 Chronic venous hypertension (idiopathic) with ulcer and inflammation of left lower extremity: Secondary | ICD-10-CM | POA: Diagnosis not present

## 2021-09-10 DIAGNOSIS — L97929 Non-pressure chronic ulcer of unspecified part of left lower leg with unspecified severity: Secondary | ICD-10-CM

## 2021-09-14 ENCOUNTER — Encounter (HOSPITAL_BASED_OUTPATIENT_CLINIC_OR_DEPARTMENT_OTHER): Payer: Self-pay | Admitting: Cardiovascular Disease

## 2021-09-14 DIAGNOSIS — Q231 Congenital insufficiency of aortic valve: Secondary | ICD-10-CM | POA: Insufficient documentation

## 2021-09-14 DIAGNOSIS — I712 Thoracic aortic aneurysm, without rupture: Secondary | ICD-10-CM | POA: Insufficient documentation

## 2021-09-14 DIAGNOSIS — Q2381 Bicuspid aortic valve: Secondary | ICD-10-CM

## 2021-09-14 DIAGNOSIS — I7121 Aneurysm of the ascending aorta, without rupture: Secondary | ICD-10-CM

## 2021-09-14 HISTORY — DX: Thoracic aortic aneurysm, without rupture: I71.2

## 2021-09-14 HISTORY — DX: Bicuspid aortic valve: Q23.81

## 2021-09-14 HISTORY — DX: Congenital insufficiency of aortic valve: Q23.1

## 2021-09-14 HISTORY — DX: Aneurysm of the ascending aorta, without rupture: I71.21

## 2021-09-14 NOTE — Assessment & Plan Note (Signed)
Continue to advise cessation.  He is not interested in quitting at this time and would not like assistance.

## 2021-09-14 NOTE — Assessment & Plan Note (Signed)
Status post peripheral stenting.  He follows with vascular.  Adding Vascepa as above.  Reducing aspirin to 81 mg.  He currently denies claudication.

## 2021-09-14 NOTE — Assessment & Plan Note (Signed)
Blood pressures well-controlled.  Continue carvedilol and Imdur.  Consider ARB given his diabetes.

## 2021-09-14 NOTE — Assessment & Plan Note (Addendum)
Continue rosuvastatin and adding Vascepa for hypertriglyceridemia.

## 2021-09-14 NOTE — Assessment & Plan Note (Signed)
4.2 cm on his most recent echocardiogram.  Repeat in 6 months.

## 2021-09-14 NOTE — Assessment & Plan Note (Addendum)
Joseph [name] presented with chest pain in the setting of acute on chronic diastolic heart failure and hyperglycemia.  Left heart cath last month revealed severe native vessel disease with patent grafts.  He continues to have chest pain.  Is not clear if this is angina.  We will try adding Ranexa 500 mg every 12 hours to see if that helps.  It will be very important to continue working on improved glycemic control.  Continue aspirin, atorvastatin, carvedilol, clopidogrel, and Imdur.  We will add Vascepa for poorly controlled triglycerides, though this is also likely attributable to his poor glucose control.  He has been taking aspirin 325 mg.  He was advised to switch this to 81 mg.

## 2021-09-14 NOTE — Assessment & Plan Note (Signed)
His intraoperative TEE and most recent echocardiogram were reviewed.  He does, in fact have a bicuspid aortic valve.  There is fusion of the right and left coronary cusps.  Gradients are not elevated.

## 2021-09-17 ENCOUNTER — Encounter: Payer: Self-pay | Admitting: Orthopedic Surgery

## 2021-09-17 ENCOUNTER — Ambulatory Visit (INDEPENDENT_AMBULATORY_CARE_PROVIDER_SITE_OTHER): Payer: Medicare HMO | Admitting: Orthopedic Surgery

## 2021-09-17 DIAGNOSIS — I87332 Chronic venous hypertension (idiopathic) with ulcer and inflammation of left lower extremity: Secondary | ICD-10-CM

## 2021-09-17 DIAGNOSIS — L97929 Non-pressure chronic ulcer of unspecified part of left lower leg with unspecified severity: Secondary | ICD-10-CM

## 2021-09-17 DIAGNOSIS — T25311A Burn of third degree of right ankle, initial encounter: Secondary | ICD-10-CM

## 2021-09-17 NOTE — Progress Notes (Signed)
Office Visit Note   Patient: Joseph [name]           Date of Birth: [birth year]           MRN: [id number] Visit Date: 09/17/2021              Requested by: Marva Panda, NP [address] [place],  [name] [address] PCP: Marva Panda, NP  No chief complaint on file.     HPI: Patient is a 50 year old gentleman with venous insufficiency swelling and ulceration both legs with a burn over the dorsal medial aspect of the right ankle.  He has been undergoing serial compression wraps for both legs.  Assessment & Plan: Visit Diagnoses:  1. Full thickness burn of right ankle, initial encounter   2. Chronic venous hypertension (idiopathic) with ulcer and inflammation of left lower extremity (HCC)     Plan: Swelling has decreased the ulcers are stable the burn is healing nicely.  We will advance patient to extra-large compression socks he will wear these around-the-clock change daily wash with soap and water.  Follow-Up Instructions: Return in about 2 weeks (around 10/01/2021).   Ortho Exam  Patient is alert, oriented, no adenopathy, well-dressed, normal affect, normal respiratory effort. Examination there is good superficial epithelialization around the wound edges of the burn to the right ankle.  This appears to be healing uneventfully the swelling has decreased significantly.  Examination of the left leg there is also significant decreased swelling and no open ulcers at this time.  Imaging: No results found.    Labs: Lab Results  Component Value Date   HGBA1C 11.8 (H) 08/15/2021   HGBA1C 13.7 (H) 07/10/2021   HGBA1C 9.9 (H) 02/18/2021   ESRSEDRATE 41 (H) 09/16/2017   CRP 8.9 (H) 09/16/2017   REPTSTATUS 07/14/2021 FINAL 07/09/2021   CULT  07/09/2021    NO GROWTH 5 DAYS Performed at [facility], [address] [place], [name] [address]      Lab Results  Component Value Date   ALBUMIN 3.1 (L) 07/17/2021   ALBUMIN 2.7 (L) 07/09/2021   ALBUMIN 3.2 (L)  04/11/2021    Lab Results  Component Value Date   MG 1.6 (L) 07/17/2021   MG 1.5 (L) 07/10/2021   MG 1.6 (L) 04/14/2021   No results found for: VD25OH  No results found for: PREALBUMIN CBC EXTENDED Latest Ref Rng & Units 08/19/2021 08/17/2021 08/16/2021  WBC 4.0 - 10.5 K/uL 9.2 9.9 9.1  RBC 4.22 - 5.81 MIL/uL 4.48 4.36 4.63  HGB 13.0 - 17.0 g/dL 11.9(L) 11.6(L) 12.4(L)  HCT 39.0 - 52.0 % 37.9(L) 36.4(L) 37.9(L)  PLT 150 - 400 K/uL 309 281 267  NEUTROABS 1.7 - 7.7 K/uL - - -  LYMPHSABS 0.7 - 4.0 K/uL - - -     There is no height or weight on file to calculate BMI.  Orders:  No orders of the defined types were placed in this encounter.  No orders of the defined types were placed in this encounter.    Procedures: No procedures performed  Clinical Data: No additional findings.  ROS:  All other systems negative, except as noted in the HPI. Review of Systems  Objective: Vital Signs: There were no vitals taken for this visit.  Specialty Comments:  No specialty comments available.  PMFS History: Patient Active Problem List   Diagnosis Date Noted   Bicuspid aortic valve 09/14/2021   Aneurysm of ascending aorta (HCC) 09/14/2021   Hyperkalemia 08/20/2021  DM (diabetes mellitus) with peripheral vascular complication (HCC) 08/20/2021   Atypical chest pain 07/17/2021   Hyponatremia 07/17/2021   Leg wound, left 07/17/2021   Essential hypertension 07/17/2021   Acute hyperglycemia 07/09/2021   Left leg cellulitis 04/11/2021   Cellulitis of left leg 03/05/2021   Cellulitis 02/18/2021   PAD (peripheral artery disease) (HCC) 10/10/2020   Osteomyelitis of fourth toe of right foot (HCC)    S/P CABG x 4 07/24/2020   Non-ST elevation (NSTEMI) myocardial infarction (HCC) 07/22/2020   Acute on chronic diastolic heart failure (HCC) 07/22/2020   Hypotension 07/18/2020   Left leg swelling 07/18/2020   Elevated troponin 07/18/2020   AKI (acute kidney injury) (HCC) 07/18/2020    Elevated brain natriuretic peptide (BNP) level 07/18/2020   GERD (gastroesophageal reflux disease) 07/18/2020   Severe Sepsis secondary to Lt leg/Foot Cellulitis 07/18/2020   Severe sepsis --Severe Sepsis secondary to Lt leg/Foot Cellulitis with hypotension and AKI 07/18/2020   Unstable angina (HCC) 11/01/2019   Edema 06/13/2019   Leukocytosis 05/31/2019   Smoker 03/08/2019   Chronic pain 03/08/2019   Chronic back pain 02/16/2019   Deep venous thrombosis (HCC) 02/16/2019   Obesity, Class III, BMI 40-49.9 (morbid obesity) (HCC) 02/16/2019   Peripheral nerve disease 02/16/2019   COPD (chronic obstructive pulmonary disease) (HCC) 01/22/2019   Anxiety disorder 03/26/2018   Open wound of toe 10/12/2017   Sinusitis 10/12/2017   Abdominal pain    Loss of weight    Diabetic foot infection (HCC) 09/16/2017   Uncontrolled type 2 diabetes mellitus with hyperglycemia, with long-term current use of insulin (HCC) 09/16/2017   Cellulitis of right foot    Chronic ulcer of great toe of right foot (HCC) 09/09/2017   Recurrent chest pain 07/29/2016   Hyperglycemia    Insulin dependent type 2 diabetes mellitus (HCC) 04/29/2015   OSA (obstructive sleep apnea) 05/08/2013   History of pulmonary embolus (PE) 04/27/2013   CAD -S/P PCI 11/02/19  09/10/2008   Hyperlipidemia 09/09/2008   Past Medical History:  Diagnosis Date   Aneurysm of ascending aorta (HCC) 09/14/2021   Anxiety    Arthritis    "knees, shoulders, hips, ankles" (07/29/2016)   Asthma    Bicuspid aortic valve 09/14/2021   CAD (coronary artery disease)    a. 2017: s/p BMS to distal Cx; b. LHC 05/30/2019: 80% mid, distal RCA s/p DES, 30% narrowing of d LM, widely patent LAD w/ luminal irregularities, widely patent stent in dCX  w/ 90+% stenosis distal to stent beofre small trifurcating obtuse marginal (potentially area of restenosis)   Chronic bronchitis (HCC)    Chronic ulcer of right great toe (HCC) 09/19/2017   COPD (chronic obstructive  pulmonary disease) (HCC)    Depression    DVT (deep venous thrombosis) (HCC)    GERD (gastroesophageal reflux disease)    Hyperlipidemia    Hypertension    Myocardial infarction (HCC) 1996   "light one"   PE (pulmonary embolism) 04/2013   On chronic Xarelto   Peripheral nerve disease    Pneumonia "several times"   Sleep apnea    Type II diabetes mellitus (HCC)     Family History  Problem Relation Age of Onset   Hypertension Brother    Hypertension Father    Diabetes Other    Hyperlipidemia Other     Past Surgical History:  Procedure Laterality Date   ABDOMINAL AORTOGRAM W/LOWER EXTREMITY N/A 10/10/2020   Procedure: ABDOMINAL AORTOGRAM W/LOWER EXTREMITY;  Surgeon: Sherren Kerns, MD;  [facility];  Service: Cardiovascular;  Laterality: N/A;   AMPUTATION Right 09/21/2017   Procedure: RIGHT GREAT TOE AMPUTATION, POSSIBLE VAC;  Surgeon: Tarry Kos, MD;  Location: [facility] OR;  Service: Orthopedics;  Laterality: Right;   AMPUTATION Left 08/13/2020   Procedure: LEFT FOOT 4TH RAY AMPUTATION;  Surgeon: [name], MD;  Location: [facility] OR;  Service: Orthopedics;  Laterality: Left;   AORTOGRAM Bilateral 03/13/2021   Procedure: ABDOMINAL AORTOGRAM WITH Left LOWER EXTREMITY RUNOFF;  Surgeon: Leonie Douglas, MD;  Location: [facility] OR;  Service: Vascular;  Laterality: Bilateral;   CARDIAC CATHETERIZATION  2006   CARDIAC CATHETERIZATION  1996   "@ [facility]; when I had my heart attack"   CARDIAC CATHETERIZATION N/A 07/29/2016   Procedure: Left Heart Cath and Coronary Angiography;  Surgeon: Runell Gess, MD;  Location: [facility] INVASIVE CV LAB;  Service: Cardiovascular;  Laterality: N/A;   CARDIAC CATHETERIZATION N/A 07/29/2016   Procedure: Coronary Stent Intervention;  Surgeon: Runell Gess, MD;  [facility];  Service: Cardiovascular;  Laterality: N/A;   CARPAL TUNNEL RELEASE Bilateral    CORONARY ANGIOPLASTY WITH STENT PLACEMENT  07/29/2016   CORONARY ARTERY BYPASS  GRAFT N/A 07/24/2020   Procedure: CORONARY ARTERY BYPASS GRAFTING (CABG), ON PUMP, TIMES FOUR, USING LEFT INTERNAL MAMMARY ARTERY AND ENDOSCOPICALLY HARVESTED RIGHT GREATER SAPHENOUS VEIN;  Surgeon: Corliss Skains, MD;  Location: [facility] OR;  Service: Open Heart Surgery;  Laterality: N/A;  FLOW TAC   CORONARY STENT INTERVENTION N/A 05/30/2019   Procedure: CORONARY STENT INTERVENTION;  Surgeon: Lyn Records, MD;  [facility]VAS[facility] LAB;  Service: Cardiovascular;  Laterality: N/A;   CORONARY STENT INTERVENTION N/A 11/02/2019   Procedure: CORONARY STENT INTERVENTION;  Surgeon: Iran Ouch, MD;  [facility];  Service: Cardiovascular;  Laterality: N/A;   ESOPHAGOGASTRODUODENOSCOPY N/A 09/22/2017   Procedure: ESOPHAGOGASTRODUODENOSCOPY (EGD);  Surgeon: Meryl Dare, MD;  Location: [facility] ENDOSCOPY;  Service: Endoscopy;  Laterality: N/A;   INTRAVASCULAR PRESSURE WIRE/FFR STUDY N/A 07/22/2020   Procedure: INTRAVASCULAR PRESSURE WIRE/FFR STUDY;  Surgeon: Marykay Lex, MD;  Location: [facility] INVASIVE CV LAB;  Service: Cardiovascular;  Laterality: N/A;   KNEE ARTHROSCOPY Bilateral    "2 on left; 1 on the right"   LEFT HEART CATH AND CORONARY ANGIOGRAPHY N/A 11/02/2019   Procedure: LEFT HEART CATH AND CORONARY ANGIOGRAPHY;  Surgeon: Iran Ouch, MD;  [facility];  Service: Cardiovascular;  Laterality: N/A;   LEFT HEART CATH AND CORONARY ANGIOGRAPHY N/A 07/22/2020   Procedure: LEFT HEART CATH AND CORONARY ANGIOGRAPHY;  Surgeon: Marykay Lex, MD;  Location: [facility] INVASIVE CV LAB;  Service: Cardiovascular;  Laterality: N/A;   LEFT HEART CATH AND CORS/GRAFTS ANGIOGRAPHY N/A 08/17/2021   Procedure: LEFT HEART CATH AND CORS/GRAFTS ANGIOGRAPHY;  Surgeon: Yvonne Kendall, MD;  [facility];  Service: Cardiovascular;  Laterality: N/A;   PERIPHERAL VASCULAR INTERVENTION Left 10/11/2020   popliteal and SFA stent placement    PERIPHERAL VASCULAR INTERVENTION  Left 10/10/2020   Procedure: PERIPHERAL VASCULAR INTERVENTION;  Surgeon: Sherren Kerns, MD;  [facility];  Service: Cardiovascular;  Laterality: Left;   RIGHT/LEFT HEART CATH AND CORONARY ANGIOGRAPHY N/A 05/30/2019   Procedure: RIGHT/LEFT HEART CATH AND CORONARY ANGIOGRAPHY;  Surgeon: Lyn Records, MD;  Location: [facility];  Service: Cardiovascular;  Laterality: N/A;   SHOULDER OPEN ROTATOR CUFF REPAIR Bilateral    TEE WITHOUT CARDIOVERSION  N/A 07/24/2020   Procedure: TRANSESOPHAGEAL ECHOCARDIOGRAM (TEE);  Surgeon: Corliss Skains, MD;  Location: [facility] OR;  Service: Open Heart Surgery;  Laterality: N/A;   Social History   Occupational History   Occupation: disabled     Associate Professor: UNEMPLOYED  Tobacco Use   Smoking status: Former    Packs/day: 1.00    Years: 34.00    Pack years: 34.00    Types: Cigarettes    Quit date: 07/12/2020    Years since quitting: 1.1   Smokeless tobacco: Former    Types: Snuff, Chew  Vaping Use   Vaping Use: Never used  Substance and Sexual Activity   Alcohol use: Yes    Comment: RARE   Drug use: No   Sexual activity: Yes

## 2021-09-27 ENCOUNTER — Encounter: Payer: Self-pay | Admitting: Orthopedic Surgery

## 2021-09-27 NOTE — Progress Notes (Signed)
Office Visit Note   Patient: Joseph [name]           Date of Birth: 01-19-1971           MRN: [id number] Visit Date: 09/10/2021              Requested by: Marva Panda, NP [address] [place],  [name] [address] PCP: Marva Panda, NP  Chief Complaint  Patient presents with   Left Leg - Follow-up   Right Leg - Follow-up      HPI: Patient is a 50 year old gentleman who presents status post Profore wraps for both lower extremities due to venous insufficiency and ulceration.  Patient previously had an ulcer burn over the dorsum of the right ankle with drainage.  Assessment & Plan: Visit Diagnoses:  1. Chronic venous hypertension (idiopathic) with ulcer and inflammation of left lower extremity (HCC)     Plan: Right ankle burn with slow improvement.  Plan we will reapply a Dynaflex wrap follow-up in 1 week.  Follow-Up Instructions: Return in about 1 week (around 09/17/2021).   Ortho Exam  Patient is alert, oriented, no adenopathy, well-dressed, normal affect, normal respiratory effort. Examination patient has slight improvement in the granulation tissue around the burn over the dorsum of the ankle.  This area of black eschar involves almost the entire dorsum of the ankle.  There is no ascending cellulitis there is no purulent drainage.  Imaging: No results found. No images are attached to the encounter.  Labs: Lab Results  Component Value Date   HGBA1C 11.8 (H) 08/15/2021   HGBA1C 13.7 (H) 07/10/2021   HGBA1C 9.9 (H) 02/18/2021   ESRSEDRATE 41 (H) 09/16/2017   CRP 8.9 (H) 09/16/2017   REPTSTATUS 07/14/2021 FINAL 07/09/2021   CULT  07/09/2021    NO GROWTH 5 DAYS Performed at [facility], [address], [place], [name] [address]      Lab Results  Component Value Date   ALBUMIN 3.1 (L) 07/17/2021   ALBUMIN 2.7 (L) 07/09/2021   ALBUMIN 3.2 (L) 04/11/2021    Lab Results  Component Value Date   MG 1.6 (L) 07/17/2021   MG 1.5 (L) 07/10/2021    MG 1.6 (L) 04/14/2021   No results found for: VD25OH  No results found for: PREALBUMIN CBC EXTENDED Latest Ref Rng & Units 08/19/2021 08/17/2021 08/16/2021  WBC 4.0 - 10.5 K/uL 9.2 9.9 9.1  RBC 4.22 - 5.81 MIL/uL 4.48 4.36 4.63  HGB 13.0 - 17.0 g/dL 11.9(L) 11.6(L) 12.4(L)  HCT 39.0 - 52.0 % 37.9(L) 36.4(L) 37.9(L)  PLT 150 - 400 K/uL 309 281 267  NEUTROABS 1.7 - 7.7 K/uL - - -  LYMPHSABS 0.7 - 4.0 K/uL - - -     There is no height or weight on file to calculate BMI.  Orders:  No orders of the defined types were placed in this encounter.  No orders of the defined types were placed in this encounter.    Procedures: No procedures performed  Clinical Data: No additional findings.  ROS:  All other systems negative, except as noted in the HPI. Review of Systems  Objective: Vital Signs: There were no vitals taken for this visit.  Specialty Comments:  No specialty comments available.  PMFS History: Patient Active Problem List   Diagnosis Date Noted   Bicuspid aortic valve 09/14/2021   Aneurysm of ascending aorta 09/14/2021   Hyperkalemia 08/20/2021   DM (diabetes mellitus) with peripheral vascular complication (HCC) 08/20/2021   Atypical  chest pain 07/17/2021   Hyponatremia 07/17/2021   Leg wound, left 07/17/2021   Essential hypertension 07/17/2021   Acute hyperglycemia 07/09/2021   Left leg cellulitis 04/11/2021   Cellulitis of left leg 03/05/2021   Cellulitis 02/18/2021   PAD (peripheral artery disease) (HCC) 10/10/2020   Osteomyelitis of fourth toe of right foot (HCC)    S/P CABG x 4 07/24/2020   Non-ST elevation (NSTEMI) myocardial infarction (HCC) 07/22/2020   Acute on chronic diastolic heart failure (HCC) 07/22/2020   Hypotension 07/18/2020   Left leg swelling 07/18/2020   Elevated troponin 07/18/2020   AKI (acute kidney injury) (HCC) 07/18/2020   Elevated brain natriuretic peptide (BNP) level 07/18/2020   GERD (gastroesophageal reflux disease)  07/18/2020   Severe Sepsis secondary to Lt leg/Foot Cellulitis 07/18/2020   Severe sepsis --Severe Sepsis secondary to Lt leg/Foot Cellulitis with hypotension and AKI 07/18/2020   Unstable angina (HCC) 11/01/2019   Edema 06/13/2019   Leukocytosis 05/31/2019   Smoker 03/08/2019   Chronic pain 03/08/2019   Chronic back pain 02/16/2019   Deep venous thrombosis (HCC) 02/16/2019   Obesity, Class III, BMI 40-49.9 (morbid obesity) (HCC) 02/16/2019   Peripheral nerve disease 02/16/2019   COPD (chronic obstructive pulmonary disease) (HCC) 01/22/2019   Anxiety disorder 03/26/2018   Open wound of toe 10/12/2017   Sinusitis 10/12/2017   Abdominal pain    Loss of weight    Diabetic foot infection (HCC) 09/16/2017   Uncontrolled type 2 diabetes mellitus with hyperglycemia, with long-term current use of insulin (HCC) 09/16/2017   Cellulitis of right foot    Chronic ulcer of great toe of right foot (HCC) 09/09/2017   Recurrent chest pain 07/29/2016   Hyperglycemia    Insulin dependent type 2 diabetes mellitus (HCC) 04/29/2015   OSA (obstructive sleep apnea) 05/08/2013   History of pulmonary embolus (PE) 04/27/2013   CAD -S/P PCI 11/02/19  09/10/2008   Hyperlipidemia 09/09/2008   Past Medical History:  Diagnosis Date   Aneurysm of ascending aorta 09/14/2021   Anxiety    Arthritis    "knees, shoulders, hips, ankles" (07/29/2016)   Asthma    Bicuspid aortic valve 09/14/2021   CAD (coronary artery disease)    a. 2017: s/p BMS to distal Cx; b. LHC 05/30/2019: 80% mid, distal RCA s/p DES, 30% narrowing of d LM, widely patent LAD w/ luminal irregularities, widely patent stent in dCX  w/ 90+% stenosis distal to stent beofre small trifurcating obtuse marginal (potentially area of restenosis)   Chronic bronchitis (HCC)    Chronic ulcer of right great toe (HCC) 09/19/2017   COPD (chronic obstructive pulmonary disease) (HCC)    Depression    DVT (deep venous thrombosis) (HCC)    GERD (gastroesophageal  reflux disease)    Hyperlipidemia    Hypertension    Myocardial infarction (HCC) 1996   "light one"   PE (pulmonary embolism) 04/2013   On chronic Xarelto   Peripheral nerve disease    Pneumonia "several times"   Sleep apnea    Type II diabetes mellitus (HCC)     Family History  Problem Relation Age of Onset   Hypertension Brother    Hypertension Father    Diabetes Other    Hyperlipidemia Other     Past Surgical History:  Procedure Laterality Date   ABDOMINAL AORTOGRAM W/LOWER EXTREMITY N/A 10/10/2020   Procedure: ABDOMINAL AORTOGRAM W/LOWER EXTREMITY;  Surgeon: Sherren Kerns, MD;  [facility] INVASIVE [facility] LAB;  Service: Cardiovascular;  Laterality: N/A;  AMPUTATION Right 09/21/2017   Procedure: RIGHT GREAT TOE AMPUTATION, POSSIBLE VAC;  Surgeon: Tarry Kos, MD;  Location: [facility] OR;  Service: Orthopedics;  Laterality: Right;   AMPUTATION Left 08/13/2020   Procedure: LEFT FOOT 4TH RAY AMPUTATION;  Surgeon: [name], MD;  Location: [facility] OR;  Service: Orthopedics;  Laterality: Left;   AORTOGRAM Bilateral 03/13/2021   Procedure: ABDOMINAL AORTOGRAM WITH Left LOWER EXTREMITY RUNOFF;  Surgeon: Leonie Douglas, MD;  Location: [facility] OR;  Service: Vascular;  Laterality: Bilateral;   CARDIAC CATHETERIZATION  2006   CARDIAC CATHETERIZATION  1996   "@ [facility]; when I had my heart attack"   CARDIAC CATHETERIZATION N/A 07/29/2016   Procedure: Left Heart Cath and Coronary Angiography;  Surgeon: Runell Gess, MD;  Location: [facility] INVASIVE CV LAB;  Service: Cardiovascular;  Laterality: N/A;   CARDIAC CATHETERIZATION N/A 07/29/2016   Procedure: Coronary Stent Intervention;  Surgeon: Runell Gess, MD;  [facility];  Service: Cardiovascular;  Laterality: N/A;   CARPAL TUNNEL RELEASE Bilateral    CORONARY ANGIOPLASTY WITH STENT PLACEMENT  07/29/2016   CORONARY ARTERY BYPASS GRAFT N/A 07/24/2020   Procedure: CORONARY ARTERY BYPASS GRAFTING (CABG), ON PUMP, TIMES FOUR, USING LEFT  INTERNAL MAMMARY ARTERY AND ENDOSCOPICALLY HARVESTED RIGHT GREATER SAPHENOUS VEIN;  Surgeon: Corliss Skains, MD;  Location[facility] OR;  Service: Open Heart Surgery;  Laterality: N/A;  FLOW TAC   CORONARY STENT INTERVENTION N/A 05/30/2019   Procedure: CORONARY STENT INTERVENTION;  Surgeon: Lyn Records, MD;  [facility];  Service: Cardiovascular;  Laterality: N/A;   CORONARY STENT INTERVENTION N/A 11/02/2019   Procedure: CORONARY STENT INTERVENTION;  Surgeon: Iran Ouch, MD;  [facility];  Service: Cardiovascular;  Laterality: N/A;   ESOPHAGOGASTRODUODENOSCOPY N/A 09/22/2017   Procedure: ESOPHAGOGASTRODUODENOSCOPY (EGD);  Surgeon: Meryl Dare, MD;  Location: [facility] ENDOSCOPY;  Service: Endoscopy;  Laterality: N/A;   INTRAVASCULAR PRESSURE WIRE/FFR STUDY N/A 07/22/2020   Procedure: INTRAVASCULAR PRESSURE WIRE/FFR STUDY;  Surgeon: Marykay Lex, MD;  Location: [facility] INVASIVE CV LAB;  Service: Cardiovascular;  Laterality: N/A;   KNEE ARTHROSCOPY Bilateral    "2 on left; 1 on the right"   LEFT HEART CATH AND CORONARY ANGIOGRAPHY N/A 11/02/2019   Procedure: LEFT HEART CATH AND CORONARY ANGIOGRAPHY;  Surgeon: Iran Ouch, MD;  [facility];  Service: Cardiovascular;  Laterality: N/A;   LEFT HEART CATH AND CORONARY ANGIOGRAPHY N/A 07/22/2020   Procedure: LEFT HEART CATH AND CORONARY ANGIOGRAPHY;  Surgeon: Marykay Lex, MD;  Location: [facility] INVASIVE CV LAB;  Service: Cardiovascular;  Laterality: N/A;   LEFT HEART CATH AND CORS/GRAFTS ANGIOGRAPHY N/A 08/17/2021   Procedure: LEFT HEART CATH AND CORS/GRAFTS ANGIOGRAPHY;  Surgeon: Yvonne Kendall, MD;  [facility];  Service: Cardiovascular;  Laterality: N/A;   PERIPHERAL VASCULAR INTERVENTION Left 10/11/2020   popliteal and SFA stent placement    PERIPHERAL VASCULAR INTERVENTION Left 10/10/2020   Procedure: PERIPHERAL VASCULAR INTERVENTION;  Surgeon: Sherren Kerns, MD;   [facility];  Service: Cardiovascular;  Laterality: Left;   RIGHT/LEFT HEART CATH AND CORONARY ANGIOGRAPHY N/A 05/30/2019   Procedure: RIGHT/LEFT HEART CATH AND CORONARY ANGIOGRAPHY;  Surgeon: Lyn Records, MD;  [facility];  Service: Cardiovascular;  Laterality: N/A;   SHOULDER OPEN ROTATOR CUFF REPAIR Bilateral    TEE WITHOUT CARDIOVERSION N/A 07/24/2020   Procedure: TRANSESOPHAGEAL ECHOCARDIOGRAM (TEE);  Surgeon: Corliss Skains,  MD;  Location[facility] OR;  Service: Open Heart Surgery;  Laterality: N/A;   Social History   Occupational History   Occupation: disabled     Associate Professor: UNEMPLOYED  Tobacco Use   Smoking status: Former    Packs/day: 1.00    Years: 34.00    Pack years: 34.00    Types: Cigarettes    Quit date: 07/12/2020    Years since quitting: 1.2   Smokeless tobacco: Former    Types: Snuff, Chew  Vaping Use   Vaping Use: Never used  Substance and Sexual Activity   Alcohol use: Yes    Comment: RARE   Drug use: No   Sexual activity: Yes

## 2021-10-01 ENCOUNTER — Ambulatory Visit: Payer: Medicare HMO | Admitting: Orthopedic Surgery

## 2021-10-13 ENCOUNTER — Ambulatory Visit: Payer: Medicare HMO | Admitting: Family

## 2021-10-21 ENCOUNTER — Ambulatory Visit (INDEPENDENT_AMBULATORY_CARE_PROVIDER_SITE_OTHER): Payer: Medicare HMO | Admitting: Family

## 2021-10-21 ENCOUNTER — Ambulatory Visit (INDEPENDENT_AMBULATORY_CARE_PROVIDER_SITE_OTHER): Payer: Medicare HMO

## 2021-10-21 DIAGNOSIS — L97521 Non-pressure chronic ulcer of other part of left foot limited to breakdown of skin: Secondary | ICD-10-CM

## 2021-10-21 NOTE — Progress Notes (Signed)
Office Visit Note   Patient: Joseph [name]           Date of Birth: Apr 24, 1971           MRN: [id number] Visit Date: 10/21/2021              Requested by: [name], NP [address] [place],  [place] [address] PCP: [name], NP  Chief Complaint  Patient presents with   Left Foot - Wound Check   Right Foot - Wound Check      HPI: The patient is a 50 year old gentleman who presents today in follow-up for 2 separate issues.  He has a ulceration over his anterior medial ankle on the right from a burn he states he has been doing Silvadene dressing changes to this.  On the left he has Wagner grade 1 ulcer beneath the third metatarsal head as well as degloving of his second toe.  He states that he fell asleep and due to his insensate neuropathy he was unable to feel when his puppies bit off his toenail and degloved the skin.    Patient has chronic venous insufficiency of both lower extremities.  Assessment & Plan: Visit Diagnoses:  1. Ulcer of left foot, limited to breakdown of skin ([place])     Plan: daily dial soap cleansing. Silvadene dressings to toes and ankle wounds. Minimize weight bearing on left. Close monitoring for changes. Return precautions discussed. Placed on oral abx.  Follow-Up Instructions: Return in about 26 days (around 11/16/2021).   Ortho Exam  Patient is alert, oriented, no adenopathy, well-dressed, normal affect, normal respiratory effort. Examination patient has what appears to be a full-thickness soft tissue injury with ischemic soft tissue on the right ankle that is 3 cm in diameter. there is pitting weeping edema with dermatitis in the right leg there is no cellulitis.  Patient has brawny edema in the left lower extremity as well with venous stasis ulcer that is 1 cm diameter the left leg has no cellulitis the ulcer has good granulation tissue.  Patient met also has a 1 cm diameter ulcer on the plantar aspect of the left midfoot beneath  the second and third metatarsal heads.   Imaging: No results found. No images are attached to the encounter.  Labs: Lab Results  Component Value Date   HGBA1C 10.0 (H) 11/18/2021   HGBA1C 11.8 (H) 08/15/2021   HGBA1C 13.7 (H) 07/10/2021   ESRSEDRATE 45 (H) 11/18/2021   ESRSEDRATE 41 (H) 09/16/2017   CRP 12.1 (H) 11/18/2021   CRP 8.9 (H) 09/16/2017   REPTSTATUS 11/22/2021 FINAL 11/17/2021   REPTSTATUS 11/22/2021 FINAL 11/17/2021   CULT  11/17/2021    NO GROWTH 5 DAYS Performed at [facility], [place] [address] [place], [place] [address]    CULT  11/17/2021    NO GROWTH 5 DAYS Performed at [facility] [address], [place], [place] [address]      Lab Results  Component Value Date   ALBUMIN 1.8 (L) 11/25/2021   ALBUMIN 2.6 (L) 11/17/2021   ALBUMIN 3.1 (L) 07/17/2021    Lab Results  Component Value Date   MG 1.6 (L) 07/17/2021   MG 1.5 (L) 07/10/2021   MG 1.6 (L) 04/14/2021   No results found for: VD25OH  No results found for: PREALBUMIN CBC EXTENDED Latest Ref Rng & Units 11/23/2021 11/21/2021 11/20/2021  WBC 4.0 - 10.5 K/uL 10.5 10.4 14.6(H)  RBC 4.22 - 5.81 MIL/uL 3.93(L)  3.91(L) 4.53  HGB 13.0 - 17.0 g/dL 10.0(L) 9.9(L) 11.6(L)  HCT 39.0 - 52.0 % 31.9(L) 31.6(L) 36.8(L)  PLT 150 - 400 K/uL 387 281 325  NEUTROABS 1.7 - 7.7 K/uL - - -  LYMPHSABS 0.7 - 4.0 K/uL - - -     There is no height or weight on file to calculate BMI.  Orders:  Orders Placed This Encounter  Procedures   XR Toe 2nd Left   No orders of the defined types were placed in this encounter.    Procedures: No procedures performed  Clinical Data: No additional findings.  ROS:  All other systems negative, except as noted in the HPI. Review of Systems  Constitutional:  Negative for chills and fever.  Cardiovascular:  Positive for leg swelling.  Skin:  Positive for color change and wound.   Objective: Vital Signs: There were no vitals taken for this visit.  Specialty  Comments:  No specialty comments available.  PMFS History: Patient Active Problem List   Diagnosis Date Noted   Bipolar disorder with depression ([place])    Severe sepsis ([place]) 11/18/2021   Chest pain 11/18/2021   Dyspnea 11/18/2021   Wound infection    Ulcer of left foot with necrosis of bone ([place])    Bicuspid aortic valve 09/14/2021   Aneurysm of ascending aorta 09/14/2021   Hyperkalemia 08/20/2021   DM (diabetes mellitus) with peripheral vascular complication ([place]) 16/06/3709   Atypical chest pain 07/17/2021   Hyponatremia 07/17/2021   Leg wound, left 07/17/2021   Essential hypertension 07/17/2021   Acute hyperglycemia 07/09/2021   Left leg cellulitis 04/11/2021   Cellulitis of left leg 03/05/2021   Cellulitis 02/18/2021   PAD (peripheral artery disease) ([place]) 10/10/2020   Osteomyelitis of fourth toe of right foot ([place])    S/P CABG x 4 07/24/2020   Non-ST elevation (NSTEMI) myocardial infarction ([place]) 07/22/2020   Acute on chronic diastolic heart failure ([place]) 07/22/2020   Hypotension 07/18/2020   Left leg swelling 07/18/2020   Elevated troponin 07/18/2020   AKI (acute kidney injury) ([place]) 07/18/2020   Elevated brain natriuretic peptide (BNP) level 07/18/2020   GERD (gastroesophageal reflux disease) 07/18/2020   Unstable angina ([place]) 11/01/2019   Edema 06/13/2019   Leukocytosis 05/31/2019   Smoker 03/08/2019   Chronic pain 03/08/2019   Chronic back pain 02/16/2019   Deep venous thrombosis ([place]) 02/16/2019   Obesity, Class III, BMI 40-49.9 (morbid obesity) ([place]) 02/16/2019   Peripheral nerve disease 02/16/2019   COPD (chronic obstructive pulmonary disease) ([place]) 01/22/2019   Anxiety disorder 03/26/2018   Open wound of toe 10/12/2017   Sinusitis 10/12/2017   Abdominal pain    Loss of weight    Diabetic foot infection ([place]) 09/16/2017   Uncontrolled type 2 diabetes mellitus with hyperglycemia, with long-term current use of insulin ([place]) 09/16/2017   Cellulitis of  right foot    Chronic ulcer of great toe of right foot ([place]) 09/09/2017   Recurrent chest pain 07/29/2016   Hyperglycemia    Insulin dependent type 2 diabetes mellitus ([place]) 04/29/2015   OSA (obstructive sleep apnea) 05/08/2013   History of pulmonary embolus (PE) 04/27/2013   CAD -S/P PCI 11/02/19  09/10/2008   Hyperlipidemia 09/09/2008   Past Medical History:  Diagnosis Date   Aneurysm of ascending aorta 09/14/2021   Anxiety    Arthritis    "knees, shoulders, hips, ankles" (07/29/2016)   Asthma    Bicuspid aortic valve 09/14/2021   CAD (coronary artery disease)  a. 2017: s/p BMS to distal Cx; b. LHC 05/30/2019: 80% mid, distal RCA s/p DES, 30% narrowing of d LM, widely patent LAD w/ luminal irregularities, widely patent stent in [place]  w/ 90+% stenosis distal to stent beofre small trifurcating obtuse marginal (potentially area of restenosis)   Chronic bronchitis (HCC)    Chronic ulcer of right great toe ([place]) 09/19/2017   COPD (chronic obstructive pulmonary disease) (HCC)    Depression    DVT (deep venous thrombosis) (HCC)    GERD (gastroesophageal reflux disease)    Hyperlipidemia    Hypertension    Myocardial infarction ([place]) 1996   "light one"   PE (pulmonary embolism) 04/2013   On chronic Xarelto   Peripheral nerve disease    Pneumonia "several times"   Sleep apnea    Type II diabetes mellitus ([place])     Family History  Problem Relation Age of Onset   Hypertension Brother    Hypertension Father    Diabetes Other    Hyperlipidemia Other     Past Surgical History:  Procedure Laterality Date   ABDOMINAL AORTOGRAM W/LOWER EXTREMITY N/A 10/10/2020   Procedure: ABDOMINAL AORTOGRAM W/LOWER EXTREMITY;  Surgeon: Elam Dutch, MD;  Location: [place] CV LAB;  Service: Cardiovascular;  Laterality: N/A;   AMPUTATION Right 09/21/2017   Procedure: RIGHT GREAT TOE AMPUTATION, POSSIBLE VAC;  Surgeon: Leandrew Koyanagi, MD;  Location: [facility];  Service: Orthopedics;  Laterality:  Right;   AMPUTATION Left 08/13/2020   Procedure: LEFT FOOT 4TH RAY AMPUTATION;  Surgeon: Newt Minion, MD;  Location: [place];  Service: Orthopedics;  Laterality: Left;   AMPUTATION Left 11/20/2021   Procedure: LEFT TRANSMETATARSAL AMPUTATION;  Surgeon: Newt Minion, MD;  Location: [facility];  Service: Orthopedics;  Laterality: Left;   AORTOGRAM Bilateral 03/13/2021   Procedure: ABDOMINAL AORTOGRAM WITH Left LOWER EXTREMITY RUNOFF;  Surgeon: Cherre Robins, MD;  Location: [facility];  Service: Vascular;  Laterality: Bilateral;   CARDIAC CATHETERIZATION  2006   [place]   "@ [facility]; when I had my heart attack"   CARDIAC CATHETERIZATION N/A 07/29/2016   Procedure: Left Heart Cath and Coronary Angiography;  Surgeon: Lorretta Harp, MD;  Location: [facility] CV LAB;  Service: Cardiovascular;  Laterality: N/A;   CARDIAC CATHETERIZATION N/A 07/29/2016   Procedure: Coronary Stent Intervention;  Surgeon: Lorretta Harp, MD;  Location: [place] CV LAB;  Service: Cardiovascular;  Laterality: N/A;   CARPAL TUNNEL RELEASE Bilateral    CORONARY ANGIOPLASTY WITH STENT PLACEMENT  07/29/2016   CORONARY ARTERY BYPASS GRAFT N/A 07/24/2020   Procedure: CORONARY ARTERY BYPASS GRAFTING (CABG), ON PUMP, TIMES FOUR, USING LEFT INTERNAL MAMMARY ARTERY AND ENDOSCOPICALLY HARVESTED RIGHT GREATER SAPHENOUS VEIN;  Surgeon: Lajuana Matte, MD;  Location: [facility];  Service: Open Heart Surgery;  Laterality: N/A;  FLOW TAC   CORONARY STENT INTERVENTION N/A 05/30/2019   Procedure: CORONARY STENT INTERVENTION;  Surgeon: Belva Crome, MD;  Location: [place] CV LAB;  Service: Cardiovascular;  Laterality: N/A;   CORONARY STENT INTERVENTION N/A 11/02/2019   Procedure: CORONARY STENT INTERVENTION;  Surgeon: Wellington Hampshire, MD;  Location: [place] CV LAB;  Service: Cardiovascular;  Laterality: N/A;   ESOPHAGOGASTRODUODENOSCOPY N/A 09/22/2017   Procedure: ESOPHAGOGASTRODUODENOSCOPY (EGD);  Surgeon: Ladene Artist, MD;  Location: [facility] ENDOSCOPY;  Service: Endoscopy;  Laterality: N/A;   INTRAVASCULAR PRESSURE WIRE/FFR STUDY N/A 07/22/2020   Procedure: INTRAVASCULAR PRESSURE WIRE/FFR STUDY;  Surgeon: Leonie Man,  MD;  Location: [place] CV LAB;  Service: Cardiovascular;  Laterality: N/A;   KNEE ARTHROSCOPY Bilateral    "2 on left; 1 on the right"   LEFT HEART CATH AND CORONARY ANGIOGRAPHY N/A 11/02/2019   Procedure: LEFT HEART CATH AND CORONARY ANGIOGRAPHY;  Surgeon: Wellington Hampshire, MD;  Location: [place] CV LAB;  Service: Cardiovascular;  Laterality: N/A;   LEFT HEART CATH AND CORONARY ANGIOGRAPHY N/A 07/22/2020   Procedure: LEFT HEART CATH AND CORONARY ANGIOGRAPHY;  Surgeon: Leonie Man, MD;  Location: [place] CV LAB;  Service: Cardiovascular;  Laterality: N/A;   LEFT HEART CATH AND CORS/GRAFTS ANGIOGRAPHY N/A 08/17/2021   Procedure: LEFT HEART CATH AND CORS/GRAFTS ANGIOGRAPHY;  Surgeon: Nelva Bush, MD;  Location: [place] CV LAB;  Service: Cardiovascular;  Laterality: N/A;   PERIPHERAL VASCULAR INTERVENTION Left 10/11/2020   popliteal and SFA stent placement    PERIPHERAL VASCULAR INTERVENTION Left 10/10/2020   Procedure: PERIPHERAL VASCULAR INTERVENTION;  Surgeon: Elam Dutch, MD;  Location: [place] CV LAB;  Service: Cardiovascular;  Laterality: Left;   RIGHT/LEFT HEART CATH AND CORONARY ANGIOGRAPHY N/A 05/30/2019   Procedure: RIGHT/LEFT HEART CATH AND CORONARY ANGIOGRAPHY;  Surgeon: Belva Crome, MD;  Location: [facility] CV LAB;  Service: Cardiovascular;  Laterality: N/A;   SHOULDER OPEN ROTATOR CUFF REPAIR Bilateral    TEE WITHOUT CARDIOVERSION N/A 07/24/2020   Procedure: TRANSESOPHAGEAL ECHOCARDIOGRAM (TEE);  Surgeon: Lajuana Matte, MD;  Location: [facility];  Service: Open Heart Surgery;  Laterality: N/A;   Social History   Occupational History   Occupation: disabled     Fish farm manager: UNEMPLOYED  Tobacco Use   Smoking status: Former    Packs/day:  1.00    Years: 34.00    Pack years: 34.00    Types: Cigarettes    Quit date: 07/12/2020    Years since quitting: 1.3   Smokeless tobacco: Former    Types: Snuff, Chew  Vaping Use   Vaping Use: Never used  Substance and Sexual Activity   Alcohol use: Yes    Comment: RARE   Drug use: No   Sexual activity: Yes

## 2021-11-04 ENCOUNTER — Ambulatory Visit: Payer: Medicare HMO | Admitting: Family

## 2021-11-17 ENCOUNTER — Other Ambulatory Visit: Payer: Self-pay

## 2021-11-17 ENCOUNTER — Inpatient Hospital Stay (HOSPITAL_COMMUNITY)
Admission: EM | Admit: 2021-11-17 | Discharge: 2021-11-26 | DRG: 854 | Disposition: A | Discharge status: Skilled Nursing Facility | Payer: Medicare HMO | Attending: Family Medicine | Admitting: Family Medicine

## 2021-11-17 ENCOUNTER — Emergency Department (HOSPITAL_COMMUNITY): Payer: Medicare HMO

## 2021-11-17 ENCOUNTER — Encounter (HOSPITAL_COMMUNITY): Payer: Self-pay | Admitting: *Deleted

## 2021-11-17 DIAGNOSIS — L97526 Non-pressure chronic ulcer of other part of left foot with bone involvement without evidence of necrosis: Secondary | ICD-10-CM | POA: Diagnosis present

## 2021-11-17 DIAGNOSIS — I2510 Atherosclerotic heart disease of native coronary artery without angina pectoris: Secondary | ICD-10-CM | POA: Diagnosis present

## 2021-11-17 DIAGNOSIS — E1165 Type 2 diabetes mellitus with hyperglycemia: Secondary | ICD-10-CM | POA: Diagnosis present

## 2021-11-17 DIAGNOSIS — M86172 Other acute osteomyelitis, left ankle and foot: Secondary | ICD-10-CM | POA: Diagnosis not present

## 2021-11-17 DIAGNOSIS — Z89411 Acquired absence of right great toe: Secondary | ICD-10-CM

## 2021-11-17 DIAGNOSIS — I129 Hypertensive chronic kidney disease with stage 1 through stage 4 chronic kidney disease, or unspecified chronic kidney disease: Secondary | ICD-10-CM | POA: Diagnosis present

## 2021-11-17 DIAGNOSIS — Z882 Allergy status to sulfonamides status: Secondary | ICD-10-CM

## 2021-11-17 DIAGNOSIS — T25311S Burn of third degree of right ankle, sequela: Secondary | ICD-10-CM

## 2021-11-17 DIAGNOSIS — M19011 Primary osteoarthritis, right shoulder: Secondary | ICD-10-CM | POA: Diagnosis present

## 2021-11-17 DIAGNOSIS — I70262 Atherosclerosis of native arteries of extremities with gangrene, left leg: Secondary | ICD-10-CM | POA: Diagnosis present

## 2021-11-17 DIAGNOSIS — Q231 Congenital insufficiency of aortic valve: Secondary | ICD-10-CM

## 2021-11-17 DIAGNOSIS — M19012 Primary osteoarthritis, left shoulder: Secondary | ICD-10-CM | POA: Diagnosis present

## 2021-11-17 DIAGNOSIS — N1831 Chronic kidney disease, stage 3a: Secondary | ICD-10-CM | POA: Diagnosis present

## 2021-11-17 DIAGNOSIS — E782 Mixed hyperlipidemia: Secondary | ICD-10-CM | POA: Diagnosis not present

## 2021-11-17 DIAGNOSIS — M7989 Other specified soft tissue disorders: Secondary | ICD-10-CM | POA: Diagnosis not present

## 2021-11-17 DIAGNOSIS — Z7901 Long term (current) use of anticoagulants: Secondary | ICD-10-CM

## 2021-11-17 DIAGNOSIS — I878 Other specified disorders of veins: Secondary | ICD-10-CM | POA: Diagnosis present

## 2021-11-17 DIAGNOSIS — R6520 Severe sepsis without septic shock: Secondary | ICD-10-CM | POA: Diagnosis present

## 2021-11-17 DIAGNOSIS — L0390 Cellulitis, unspecified: Secondary | ICD-10-CM | POA: Diagnosis not present

## 2021-11-17 DIAGNOSIS — E1122 Type 2 diabetes mellitus with diabetic chronic kidney disease: Secondary | ICD-10-CM | POA: Diagnosis present

## 2021-11-17 DIAGNOSIS — E1140 Type 2 diabetes mellitus with diabetic neuropathy, unspecified: Secondary | ICD-10-CM | POA: Diagnosis present

## 2021-11-17 DIAGNOSIS — L089 Local infection of the skin and subcutaneous tissue, unspecified: Secondary | ICD-10-CM

## 2021-11-17 DIAGNOSIS — Z20822 Contact with and (suspected) exposure to covid-19: Secondary | ICD-10-CM | POA: Diagnosis present

## 2021-11-17 DIAGNOSIS — Z8349 Family history of other endocrine, nutritional and metabolic diseases: Secondary | ICD-10-CM

## 2021-11-17 DIAGNOSIS — G894 Chronic pain syndrome: Secondary | ICD-10-CM | POA: Diagnosis present

## 2021-11-17 DIAGNOSIS — Z833 Family history of diabetes mellitus: Secondary | ICD-10-CM

## 2021-11-17 DIAGNOSIS — E662 Morbid (severe) obesity with alveolar hypoventilation: Secondary | ICD-10-CM | POA: Diagnosis present

## 2021-11-17 DIAGNOSIS — Z8249 Family history of ischemic heart disease and other diseases of the circulatory system: Secondary | ICD-10-CM

## 2021-11-17 DIAGNOSIS — L97524 Non-pressure chronic ulcer of other part of left foot with necrosis of bone: Secondary | ICD-10-CM | POA: Diagnosis not present

## 2021-11-17 DIAGNOSIS — E1152 Type 2 diabetes mellitus with diabetic peripheral angiopathy with gangrene: Secondary | ICD-10-CM | POA: Diagnosis present

## 2021-11-17 DIAGNOSIS — N179 Acute kidney failure, unspecified: Secondary | ICD-10-CM | POA: Diagnosis present

## 2021-11-17 DIAGNOSIS — I7121 Aneurysm of the ascending aorta, without rupture: Secondary | ICD-10-CM | POA: Diagnosis present

## 2021-11-17 DIAGNOSIS — M869 Osteomyelitis, unspecified: Secondary | ICD-10-CM | POA: Diagnosis present

## 2021-11-17 DIAGNOSIS — T148XXA Other injury of unspecified body region, initial encounter: Secondary | ICD-10-CM | POA: Diagnosis not present

## 2021-11-17 DIAGNOSIS — Z86711 Personal history of pulmonary embolism: Secondary | ICD-10-CM

## 2021-11-17 DIAGNOSIS — Z6841 Body Mass Index (BMI) 40.0 and over, adult: Secondary | ICD-10-CM | POA: Diagnosis not present

## 2021-11-17 DIAGNOSIS — Z7984 Long term (current) use of oral hypoglycemic drugs: Secondary | ICD-10-CM

## 2021-11-17 DIAGNOSIS — E875 Hyperkalemia: Secondary | ICD-10-CM | POA: Diagnosis present

## 2021-11-17 DIAGNOSIS — A419 Sepsis, unspecified organism: Principal | ICD-10-CM | POA: Diagnosis present

## 2021-11-17 DIAGNOSIS — E11621 Type 2 diabetes mellitus with foot ulcer: Secondary | ICD-10-CM | POA: Diagnosis present

## 2021-11-17 DIAGNOSIS — Z23 Encounter for immunization: Secondary | ICD-10-CM | POA: Diagnosis present

## 2021-11-17 DIAGNOSIS — F319 Bipolar disorder, unspecified: Secondary | ICD-10-CM | POA: Diagnosis present

## 2021-11-17 DIAGNOSIS — Z89422 Acquired absence of other left toe(s): Secondary | ICD-10-CM

## 2021-11-17 DIAGNOSIS — Z794 Long term (current) use of insulin: Secondary | ICD-10-CM

## 2021-11-17 DIAGNOSIS — Z95820 Peripheral vascular angioplasty status with implants and grafts: Secondary | ICD-10-CM

## 2021-11-17 DIAGNOSIS — J449 Chronic obstructive pulmonary disease, unspecified: Secondary | ICD-10-CM | POA: Diagnosis present

## 2021-11-17 DIAGNOSIS — R0600 Dyspnea, unspecified: Secondary | ICD-10-CM

## 2021-11-17 DIAGNOSIS — E11649 Type 2 diabetes mellitus with hypoglycemia without coma: Secondary | ICD-10-CM | POA: Diagnosis not present

## 2021-11-17 DIAGNOSIS — L03116 Cellulitis of left lower limb: Secondary | ICD-10-CM | POA: Diagnosis present

## 2021-11-17 DIAGNOSIS — L97319 Non-pressure chronic ulcer of right ankle with unspecified severity: Secondary | ICD-10-CM | POA: Diagnosis present

## 2021-11-17 DIAGNOSIS — E1169 Type 2 diabetes mellitus with other specified complication: Secondary | ICD-10-CM | POA: Diagnosis present

## 2021-11-17 DIAGNOSIS — M545 Low back pain, unspecified: Secondary | ICD-10-CM | POA: Diagnosis present

## 2021-11-17 DIAGNOSIS — I872 Venous insufficiency (chronic) (peripheral): Secondary | ICD-10-CM | POA: Diagnosis present

## 2021-11-17 DIAGNOSIS — Z955 Presence of coronary angioplasty implant and graft: Secondary | ICD-10-CM

## 2021-11-17 DIAGNOSIS — Z87891 Personal history of nicotine dependence: Secondary | ICD-10-CM

## 2021-11-17 DIAGNOSIS — Z7902 Long term (current) use of antithrombotics/antiplatelets: Secondary | ICD-10-CM

## 2021-11-17 DIAGNOSIS — K219 Gastro-esophageal reflux disease without esophagitis: Secondary | ICD-10-CM | POA: Diagnosis present

## 2021-11-17 DIAGNOSIS — F411 Generalized anxiety disorder: Secondary | ICD-10-CM | POA: Diagnosis present

## 2021-11-17 DIAGNOSIS — Z86718 Personal history of other venous thrombosis and embolism: Secondary | ICD-10-CM

## 2021-11-17 DIAGNOSIS — E785 Hyperlipidemia, unspecified: Secondary | ICD-10-CM | POA: Diagnosis present

## 2021-11-17 DIAGNOSIS — Z951 Presence of aortocoronary bypass graft: Secondary | ICD-10-CM

## 2021-11-17 DIAGNOSIS — I252 Old myocardial infarction: Secondary | ICD-10-CM

## 2021-11-17 DIAGNOSIS — Z79899 Other long term (current) drug therapy: Secondary | ICD-10-CM

## 2021-11-17 DIAGNOSIS — R079 Chest pain, unspecified: Secondary | ICD-10-CM | POA: Diagnosis present

## 2021-11-17 LAB — COMPREHENSIVE METABOLIC PANEL
ALT: 12 U/L (ref 0–44)
AST: 16 U/L (ref 15–41)
Albumin: 2.6 g/dL — ABNORMAL LOW (ref 3.5–5.0)
Alkaline Phosphatase: 117 U/L (ref 38–126)
Anion gap: 10 (ref 5–15)
BUN: 10 mg/dL (ref 6–20)
CO2: 24 mmol/L (ref 22–32)
Calcium: 8.7 mg/dL — ABNORMAL LOW (ref 8.9–10.3)
Chloride: 101 mmol/L (ref 98–111)
Creatinine, Ser: 1.14 mg/dL (ref 0.61–1.24)
GFR, Estimated: >60 mL/min (ref >60)
Glucose, Bld: 368 mg/dL — ABNORMAL HIGH (ref 70–99)
Potassium: 4.2 mmol/L (ref 3.5–5.1)
Sodium: 135 mmol/L (ref 135–145)
Total Bilirubin: 0.5 mg/dL (ref 0.3–1.2)
Total Protein: 6.6 g/dL (ref 6.5–8.1)

## 2021-11-17 LAB — TROPONIN I (HIGH SENSITIVITY)
Troponin I (High Sensitivity): 34 ng/L — ABNORMAL HIGH (ref <18)
Troponin I (High Sensitivity): 34 ng/L — ABNORMAL HIGH (ref <18)

## 2021-11-17 LAB — CBC
HCT: 40.5 % (ref 39.0–52.0)
Hemoglobin: 12.7 g/dL — ABNORMAL LOW (ref 13.0–17.0)
MCH: 26.2 pg (ref 26.0–34.0)
MCHC: 31.4 g/dL (ref 30.0–36.0)
MCV: 83.5 fL (ref 80.0–100.0)
Platelets: 322 10*3/uL (ref 150–400)
RBC: 4.85 MIL/uL (ref 4.22–5.81)
RDW: 14.1 % (ref 11.5–15.5)
WBC: 10.4 10*3/uL (ref 4.0–10.5)
nRBC: 0 % (ref 0.0–0.2)

## 2021-11-17 LAB — LACTIC ACID, PLASMA
Lactic Acid, Venous: 3.8 mmol/L (ref 0.5–1.9)
Lactic Acid, Venous: 2.6 mmol/L (ref 0.5–1.9)

## 2021-11-17 MED ORDER — NITROGLYCERIN 0.4 MG SL SUBL
SUBLINGUAL_TABLET | SUBLINGUAL | Status: AC
  Filled 2021-11-17: qty 1

## 2021-11-17 MED ORDER — SODIUM CHLORIDE 0.9 % IV BOLUS
500.0000 mL | Freq: Once | INTRAVENOUS | Status: AC
  Administered 2021-11-18: 500 mL via INTRAVENOUS

## 2021-11-17 MED ORDER — VANCOMYCIN HCL 10 G IV SOLR
2500.0000 mg | Freq: Once | INTRAVENOUS | Status: AC
  Administered 2021-11-18: 2500 mg via INTRAVENOUS
  Filled 2021-11-17: qty 2500

## 2021-11-17 MED ORDER — NITROGLYCERIN 0.4 MG SL SUBL
0.4000 mg | SUBLINGUAL_TABLET | SUBLINGUAL | Status: DC | PRN
  Administered 2021-11-17: 0.4 mg via SUBLINGUAL

## 2021-11-17 MED ORDER — ACETAMINOPHEN 325 MG PO TABS
ORAL_TABLET | ORAL | Status: AC
  Filled 2021-11-17: qty 2

## 2021-11-17 MED ORDER — TETANUS-DIPHTH-ACELL PERTUSSIS 5-2.5-18.5 LF-MCG/0.5 IM SUSY
PREFILLED_SYRINGE | INTRAMUSCULAR | Status: AC
  Filled 2021-11-17: qty 0.5

## 2021-11-17 MED ORDER — METRONIDAZOLE 500 MG/100ML IV SOLN
500.0000 mg | Freq: Once | INTRAVENOUS | Status: AC
  Administered 2021-11-17: 500 mg via INTRAVENOUS
  Filled 2021-11-17: qty 100

## 2021-11-17 MED ORDER — ACETAMINOPHEN 325 MG PO TABS
650.0000 mg | ORAL_TABLET | Freq: Once | ORAL | Status: AC
  Administered 2021-11-17: 650 mg via ORAL

## 2021-11-17 MED ORDER — SODIUM CHLORIDE 0.9 % IV SOLN
2.0000 g | Freq: Once | INTRAVENOUS | Status: AC
  Administered 2021-11-17: 2 g via INTRAVENOUS
  Filled 2021-11-17: qty 2

## 2021-11-17 MED ORDER — TETANUS-DIPHTH-ACELL PERTUSSIS 5-2.5-18.5 LF-MCG/0.5 IM SUSY
0.5000 mL | PREFILLED_SYRINGE | Freq: Once | INTRAMUSCULAR | Status: AC
  Administered 2021-11-17: 0.5 mL via INTRAMUSCULAR

## 2021-11-17 NOTE — ED Provider Notes (Signed)
Emergency Medicine Provider Triage Evaluation Note  Joseph [name] , a 50 y.o. male  was evaluated in triage.  Pt complains of centralized chest pain and shortness of breath starting several hours ago.  Patient is also complaining of left first and second toe pain.  Patient was being followed for diabetic ulcer, but states that he is missed his past 2 appointments and he is having worsening pain.  Review of Systems  Positive: Chest pain, shortness of breath, left foot pain Negative: Fever, chills, abdominal pain, nausea, vomiting  Physical Exam  There were no vitals taken for this visit. Gen:   Awake, no distress   Resp:  Normal effort  MSK:   Moves extremities without difficulty  Other:  Gangrenous appearing left first and second toes with purulent drainage and overlying erythema  Medical Decision Making  Medically screening exam initiated at 8:46 PM.  Appropriate orders placed.  Joseph [name] was informed that the remainder of the evaluation will be completed by another provider, this initial triage assessment does not replace that evaluation, and the importance of remaining in the ED until their evaluation is complete.     Joseph [name] 11/17/21 2055    Maia Plan, MD 11/23/21 1024

## 2021-11-17 NOTE — ED Provider Notes (Addendum)
[facility] EMERGENCY DEPARTMENT Provider Note   CSN: [id number] Arrival date & time: 11/17/21  2025     History Chief Complaint  Patient presents with   Chest Pain   Diabetic foot    Joseph [name] is a 50 y.o. male.  HPI   Pt is a 50 y/o male with a h/o ascending aortic aneurysm, anxiety, arthritis, asthma, CAD, bicuspid aortic valve, COPD, depression, DVT, GERD, hyperlipidemia, hypertension, MI, PE, pneumonia, diabetes,, who presents to the ED today for eval of chest pain. He is not sure when it started. Reports heaviness to the chest. States he took 2 ntg earlier today. This helped his sxs for a few minutes. Reports sob, cough. Denies vomiting.  States this feels like his prior MIs.  He further reports redness and concern for infection the left foot. States redness started a few months ago after his dog bit his toe. He reports fevers today.   Past Medical History:  Diagnosis Date   Aneurysm of ascending aorta 09/14/2021   Anxiety    Arthritis    "knees, shoulders, hips, ankles" (07/29/2016)   Asthma    Bicuspid aortic valve 09/14/2021   CAD (coronary artery disease)    a. 2017: s/p BMS to distal Cx; b. LHC 05/30/2019: 80% mid, distal RCA s/p DES, 30% narrowing of d LM, widely patent LAD w/ luminal irregularities, widely patent stent in [place]  w/ 90+% stenosis distal to stent beofre small trifurcating obtuse marginal (potentially area of restenosis)   Chronic bronchitis (HCC)    Chronic ulcer of right great toe ([place]) 09/19/2017   COPD (chronic obstructive pulmonary disease) (HCC)    Depression    DVT (deep venous thrombosis) (HCC)    GERD (gastroesophageal reflux disease)    Hyperlipidemia    Hypertension    Myocardial infarction ([place]) 1996   "light one"   PE (pulmonary embolism) 04/2013   On chronic Xarelto   Peripheral nerve disease    Pneumonia "several times"   Sleep apnea    Type II diabetes mellitus ([place])     Patient Active Problem List    Diagnosis Date Noted   Bicuspid aortic valve 09/14/2021   Aneurysm of ascending aorta 09/14/2021   Hyperkalemia 08/20/2021   DM (diabetes mellitus) with peripheral vascular complication ([place]) [id number]   Atypical chest pain 07/17/2021   Hyponatremia 07/17/2021   Leg wound, left 07/17/2021   Essential hypertension 07/17/2021   Acute hyperglycemia 07/09/2021   Left leg cellulitis 04/11/2021   Cellulitis of left leg 03/05/2021   Cellulitis 02/18/2021   PAD (peripheral artery disease) ([place]) 10/10/2020   Osteomyelitis of fourth toe of right foot (HCC)    S/P CABG x 4 07/24/2020   Non-ST elevation (NSTEMI) myocardial infarction ([place]) 07/22/2020   Acute on chronic diastolic heart failure ([place]) 07/22/2020   Hypotension 07/18/2020   Left leg swelling 07/18/2020   Elevated troponin 07/18/2020   AKI (acute kidney injury) ([place]) 07/18/2020   Elevated brain natriuretic peptide (BNP) level 07/18/2020   GERD (gastroesophageal reflux disease) 07/18/2020   Severe Sepsis secondary to Lt leg/Foot Cellulitis 07/18/2020   Severe sepsis --Severe Sepsis secondary to Lt leg/Foot Cellulitis with hypotension and AKI 07/18/2020   Unstable angina ([place]) 11/01/2019   Edema 06/13/2019   Leukocytosis 05/31/2019   Smoker 03/08/2019   Chronic pain 03/08/2019   Chronic back pain 02/16/2019   Deep venous thrombosis ([place]) 02/16/2019   Obesity, Class III, BMI 40-49.9 (morbid obesity) ([place]) 02/16/2019   Peripheral  nerve disease 02/16/2019   COPD (chronic obstructive pulmonary disease) ([place]) 01/22/2019   Anxiety disorder 03/26/2018   Open wound of toe 10/12/2017   Sinusitis 10/12/2017   Abdominal pain    Loss of weight    Diabetic foot infection ([place]) 09/16/2017   Uncontrolled type 2 diabetes mellitus with hyperglycemia, with long-term current use of insulin ([place]) 09/16/2017   Cellulitis of right foot    Chronic ulcer of great toe of right foot ([place]) 09/09/2017   Recurrent chest pain 07/29/2016   Hyperglycemia     Insulin dependent type 2 diabetes mellitus ([place]) 04/29/2015   OSA (obstructive sleep apnea) 05/08/2013   History of pulmonary embolus (PE) 04/27/2013   CAD -S/P PCI 11/02/19  09/10/2008   Hyperlipidemia 09/09/2008    Past Surgical History:  Procedure Laterality Date   ABDOMINAL AORTOGRAM W/LOWER EXTREMITY N/A 10/10/2020   Procedure: ABDOMINAL AORTOGRAM W/LOWER EXTREMITY;  Surgeon: Elam Dutch, MD;  Location: [place] [facility] LAB;  Service: Cardiovascular;  Laterality: N/A;   AMPUTATION Right 09/21/2017   Procedure: RIGHT GREAT TOE AMPUTATION, POSSIBLE VAC;  Surgeon: Leandrew Koyanagi, MD;  Location: [place];  Service: Orthopedics;  Laterality: Right;   AMPUTATION Left 08/13/2020   Procedure: LEFT FOOT 4TH RAY AMPUTATION;  Surgeon: Newt Minion, MD;  Location: [place];  Service: Orthopedics;  Laterality: Left;   AORTOGRAM Bilateral 03/13/2021   Procedure: ABDOMINAL AORTOGRAM WITH Left LOWER EXTREMITY RUNOFF;  Surgeon: Cherre Robins, MD;  Location: [facility] OR;  Service: Vascular;  Laterality: Bilateral;   CARDIAC CATHETERIZATION  2006   [place]   "@ [facility]; when I had my heart attack"   CARDIAC CATHETERIZATION N/A 07/29/2016   Procedure: Left Heart Cath and Coronary Angiography;  Surgeon: Lorretta Harp, MD;  Location: [place] CV LAB;  Service: Cardiovascular;  Laterality: N/A;   CARDIAC CATHETERIZATION N/A 07/29/2016   Procedure: Coronary Stent Intervention;  Surgeon: Lorretta Harp, MD;  Location: [place] CV LAB;  Service: Cardiovascular;  Laterality: N/A;   CARPAL TUNNEL RELEASE Bilateral    CORONARY ANGIOPLASTY WITH STENT PLACEMENT  07/29/2016   CORONARY ARTERY BYPASS GRAFT N/A 07/24/2020   Procedure: CORONARY ARTERY BYPASS GRAFTING (CABG), ON PUMP, TIMES FOUR, USING LEFT INTERNAL MAMMARY ARTERY AND ENDOSCOPICALLY HARVESTED RIGHT GREATER SAPHENOUS VEIN;  Surgeon: Lajuana Matte, MD;  Location: [place];  Service: Open Heart Surgery;  Laterality: N/A;  FLOW TAC    CORONARY STENT INTERVENTION N/A 05/30/2019   Procedure: CORONARY STENT INTERVENTION;  Surgeon: Belva Crome, MD;  Location: [place] [facility] LAB;  Service: Cardiovascular;  Laterality: N/A;   CORONARY STENT INTERVENTION N/A 11/02/2019   Procedure: CORONARY STENT INTERVENTION;  Surgeon: Wellington Hampshire, MD;  Location: [place] CV LAB;  Service: Cardiovascular;  Laterality: N/A;   ESOPHAGOGASTRODUODENOSCOPY N/A 09/22/2017   Procedure: ESOPHAGOGASTRODUODENOSCOPY (EGD);  Surgeon: Ladene Artist, MD;  Location: [facility] ENDOSCOPY;  Service: Endoscopy;  Laterality: N/A;   INTRAVASCULAR PRESSURE WIRE/FFR STUDY N/A 07/22/2020   Procedure: INTRAVASCULAR PRESSURE WIRE/FFR STUDY;  Surgeon: Leonie Man, MD;  Location: [place] CV LAB;  Service: Cardiovascular;  Laterality: N/A;   KNEE ARTHROSCOPY Bilateral    "2 on left; 1 on the right"   LEFT HEART CATH AND CORONARY ANGIOGRAPHY N/A 11/02/2019   Procedure: LEFT HEART CATH AND CORONARY ANGIOGRAPHY;  Surgeon: Wellington Hampshire, MD;  Location: [place] [facility] LAB;  Service: Cardiovascular;  Laterality: N/A;   LEFT HEART CATH AND CORONARY ANGIOGRAPHY N/A  07/22/2020   Procedure: LEFT HEART CATH AND CORONARY ANGIOGRAPHY;  Surgeon: Marykay Lex, MD;  [facility] INVASIVE [facility] LAB;  Service: Cardiovascular;  Laterality: N/A;   LEFT HEART CATH AND CORS/GRAFTS ANGIOGRAPHY N/A 08/17/2021   Procedure: LEFT HEART CATH AND CORS/GRAFTS ANGIOGRAPHY;  Surgeon: Yvonne Kendall, MD;  Location: [facility] INVASIVE CV LAB;  Service: Cardiovascular;  Laterality: N/A;   PERIPHERAL VASCULAR INTERVENTION Left 10/11/2020   popliteal and SFA stent placement    PERIPHERAL VASCULAR INTERVENTION Left 10/10/2020   Procedure: PERIPHERAL VASCULAR INTERVENTION;  Surgeon: Sherren Kerns, MD;  Location: [facility] INVASIVE CV LAB;  Service: Cardiovascular;  Laterality: Left;   RIGHT/LEFT HEART CATH AND CORONARY ANGIOGRAPHY N/A 05/30/2019   Procedure: RIGHT/LEFT HEART CATH AND CORONARY ANGIOGRAPHY;   Surgeon: Lyn Records, MD;  Location: [facility] INVASIVE CV LAB;  Service: Cardiovascular;  Laterality: N/A;   SHOULDER OPEN ROTATOR CUFF REPAIR Bilateral    TEE WITHOUT CARDIOVERSION N/A 07/24/2020   Procedure: TRANSESOPHAGEAL ECHOCARDIOGRAM (TEE);  Surgeon: Corliss Skains, MD;  Location: [facility] OR;  Service: Open Heart Surgery;  Laterality: N/A;       Family History  Problem Relation Age of Onset   Hypertension Brother    Hypertension Father    Diabetes Other    Hyperlipidemia Other     Social History   Tobacco Use   Smoking status: Former    Packs/day: 1.00    Years: 34.00    Pack years: 34.00    Types: Cigarettes    Quit date: 07/12/2020    Years since quitting: 1.3   Smokeless tobacco: Former    Types: Snuff, Chew  Vaping Use   Vaping Use: Never used  Substance Use Topics   Alcohol use: Yes    Comment: RARE   Drug use: No    Home Medications Prior to Admission medications   Medication Sig Start Date End Date Taking? Authorizing Provider  albuterol (VENTOLIN HFA) 108 (90 Base) MCG/ACT inhaler Inhale 2 puffs into the lungs every 6 (six) hours as needed for wheezing or shortness of breath.    [provider]  ALPRAZolam Prudy Feeler) 1 MG tablet Take 1 mg by mouth See admin instructions. 1 tablet by mouth twice daily and 2 tabs at bedtime.    [provider]  aspirin EC 81 MG tablet Take 81 mg by mouth daily. Swallow whole.    [provider]  atorvastatin (LIPITOR) 80 MG tablet Take 1 tablet (80 mg total) by mouth daily at 6 PM. 05/31/19   Corrin Parker, PA-C  buPROPion [facility] SR) 150 MG 12 hr tablet Take 150 mg by mouth 2 (two) times daily.  08/17/16   [provider]  carvedilol (COREG) 12.5 MG tablet Take 1 tablet (12.5 mg total) by mouth 2 (two) times daily. 08/20/21   Georgie Chard D, NP  clopidogrel (PLAVIX) 75 MG tablet Take 1 tablet (75 mg total) by mouth daily with breakfast. 10/11/20   Lars Mage, PA-C  escitalopram  (LEXAPRO) 10 MG tablet Take 10 mg by mouth in the morning. 06/10/20   [provider]  fluconazole (DIFLUCAN) 100 MG tablet Take 100 mg by mouth once a week. 08/30/21   [provider]  furosemide (LASIX) 40 MG tablet Take 1 tablet (40 mg total) by mouth daily. 05/13/21 09/02/21  Chilton Si, MD  icosapent Ethyl (VASCEPA) 1 g capsule Take 2 capsules (2 g total) by mouth 2 (two) times daily. 09/02/21   Chilton Si, MD  insulin aspart (NOVOLOG) 100 UNIT/ML FlexPen Inject 12 Units into the skin 3 (three) times daily with meals. 07/13/21   Barton Dubois, MD  insulin glargine (LANTUS) 100 UNIT/ML injection Inject 60 Units into the skin 2 (two) times daily.    [provider]  isosorbide mononitrate (IMDUR) 60 MG 24 hr tablet Take 2 tablets (120 mg total) by mouth daily. 08/21/21   Tommie Raymond, NP  levofloxacin (LEVAQUIN) 750 MG tablet levofloxacin 750 mg tablet  Take 1 tablet every day by oral route for 10 days.    [provider]  liraglutide (VICTOZA) 18 MG/3ML SOPN Inject 1.8 mg into the skin daily. 07/17/21   Manuella Ghazi, Pratik D, DO  meclizine (ANTIVERT) 25 MG tablet Take 50 mg by mouth daily with breakfast. 04/10/13   [provider]  meclizine (ANTIVERT) 25 MG tablet Take 25 mg by mouth at bedtime.    [provider]  metFORMIN (GLUCOPHAGE) 1000 MG tablet Take 1 tablet (1,000 mg total) by mouth 2 (two) times daily with a meal. 11/05/19   Isaiah Serge, NP  mupirocin ointment (BACTROBAN) 2 % Place 1 application into the nose 2 (two) times daily. 08/20/21   Tommie Raymond, NP  mupirocin ointment (BACTROBAN) 2 % Apply topically 2 (two) times daily. 08/20/21   Tommie Raymond, NP  naloxone [facility]) nasal spray 4 mg/0.1 mL Place 4 mg into the nose as needed (overdose).    [provider]  nitroGLYCERIN (NITROSTAT) 0.4 MG SL tablet Place 0.4 mg under the tongue every 5 (five) minutes as needed for chest pain.    [provider]   oxyCODONE (ROXICODONE) 15 MG immediate release tablet Take 1 tablet (15 mg total) by mouth 5 (five) times daily. 03/14/21   Jeralyn Bennett, MD  pantoprazole (PROTONIX) 40 MG tablet Take 1 tablet (40 mg total) by mouth daily. 09/23/17   Hongalgi, Lenis Dickinson, MD  pregabalin (LYRICA) 150 MG capsule Take 300 mg by mouth in the morning. 05/06/19   [provider]  pregabalin (LYRICA) 150 MG capsule Take 150 mg by mouth at bedtime.    [provider]  ranolazine (RANEXA) 500 MG 12 hr tablet Take 1 tablet (500 mg total) by mouth 2 (two) times daily. 09/02/21   Skeet Latch, MD  rosuvastatin (CRESTOR) 40 MG tablet Take 1 tablet (40 mg total) by mouth every evening. 08/20/21   Tommie Raymond, NP    Allergies    Sulfa antibiotics  Review of Systems   Review of Systems  Constitutional:  Positive for chills and fever.  HENT:  Negative for ear pain and sore throat.   Eyes:  Negative for pain and visual disturbance.  Respiratory:  Positive for cough and shortness of breath.   Cardiovascular:  Positive for chest pain. Negative for palpitations.  Gastrointestinal:  Negative for abdominal pain, nausea and vomiting.  Genitourinary:  Negative for dysuria and hematuria.  Musculoskeletal:  Negative for back pain.  Skin:  Positive for wound.  Neurological:  Positive for numbness (neuropathy).  All other systems reviewed and are negative.  Physical Exam Updated Vital Signs BP (!) 113/95   Pulse (!) 117   Temp 100 F (37.8 C)   Resp 11   Ht 6\' 5"  (1.956 m)   Wt (!) 156 kg   SpO2 99%   BMI 40.79 kg/m   Physical Exam Vitals and nursing note reviewed.  Constitutional:      General: He is not in acute distress.  Appearance: He is well-developed.  HENT:     Head: Normocephalic and atraumatic.  Eyes:     Conjunctiva/sclera: Conjunctivae normal.  Cardiovascular:     Rate and Rhythm: Regular rhythm. Tachycardia present.     Heart sounds: No murmur heard. Pulmonary:      Effort: Pulmonary effort is normal. No respiratory distress.     Breath sounds: Normal breath sounds. No decreased breath sounds.  Abdominal:     Palpations: Abdomen is soft.     Tenderness: There is no abdominal tenderness.  Musculoskeletal:        General: No swelling.     Cervical back: Neck supple.     Right lower leg: Edema present.     Left lower leg: Edema present.  Skin:    General: Skin is warm and dry.     Capillary Refill: Capillary refill takes less than 2 seconds.     Comments: Foul smelling wounds to the bilat feet. Large ulceration noted to the base of the left foot which appears consistent with dry gangrene. There are several deep lacerations to the toes on the right foot (known when this occurred).   Neurological:     Mental Status: He is alert.  Psychiatric:        Mood and Affect: Mood normal.            ED Results / Procedures / Treatments   Labs (all labs ordered are listed, but only abnormal results are displayed) Labs Reviewed  CBC - Abnormal; Notable for the following components:      Result Value   Hemoglobin 12.7 (*)    All other components within normal limits  COMPREHENSIVE METABOLIC PANEL - Abnormal; Notable for the following components:   Glucose, Bld 368 (*)    Calcium 8.7 (*)    Albumin 2.6 (*)    All other components within normal limits  LACTIC ACID, PLASMA - Abnormal; Notable for the following components:   Lactic Acid, Venous 3.8 (*)    All other components within normal limits  LACTIC ACID, PLASMA - Abnormal; Notable for the following components:   Lactic Acid, Venous 2.6 (*)    All other components within normal limits  TROPONIN I (HIGH SENSITIVITY) - Abnormal; Notable for the following components:   Troponin I (High Sensitivity) 34 (*)    All other components within normal limits  CULTURE, BLOOD (ROUTINE X 2)  CULTURE, BLOOD (ROUTINE X 2)  RESP PANEL BY RT-PCR (FLU A&B, COVID) ARPGX2  TROPONIN I (HIGH SENSITIVITY)     EKG EKG Interpretation  Date/Time:  Tuesday November 17 2021 20:38:05 EST Ventricular Rate:  119 PR Interval:  146 QRS Duration: 108 QT Interval:  322 QTC Calculation: 452 R Axis:   -68 Text Interpretation: Sinus tachycardia Left anterior fascicular block Inferior infarct , age undetermined Anterolateral infarct , age undetermined Abnormal ECG When compared to prior, faster rate. No STEMI Confirmed by Antony Blackbird [phone]) on 11/17/2021 9:16:07 PM  Radiology DG Chest 2 View  Result Date: 11/17/2021 CLINICAL DATA:  Chest pain. EXAM: CHEST - 2 VIEW COMPARISON:  Chest x-ray 08/15/2021. FINDINGS: Sternotomy wires are again seen. The heart size and mediastinal contours are within normal limits. Both lungs are clear. The visualized skeletal structures are unremarkable. IMPRESSION: No active cardiopulmonary disease. Electronically Signed   By: Ronney Asters M.D.   On: 11/17/2021 22:29   DG Foot Complete Left  Result Date: 11/17/2021 CLINICAL DATA:  Concern for osteomyelitis. EXAM: LEFT FOOT -  COMPLETE 3+ VIEW COMPARISON:  Left toe x-ray 10/21/2021 3 FINDINGS: There is no acute fracture or dislocation. There is been prior amputation of the distal second metatarsal and phalanx. Soft tissue ulceration is seen in this region. There is no cortical erosion or periosteal reaction. There is no acute fracture or dislocation. IMPRESSION: 1. Prior amputation of the second toe and distal second metatarsal with overlying soft tissue ulceration. 2. No acute bony abnormality. Electronically Signed   By: Darliss CheneyAmy  Guttmann M.D.   On: 11/17/2021 22:31   DG Foot Complete Right  Result Date: 11/17/2021 CLINICAL DATA:  Wound. EXAM: RIGHT FOOT COMPLETE - 3+ VIEW COMPARISON:  None. FINDINGS: There has been prior amputation of the first toe. There is soft tissue swelling of the forefoot. There is a linear radiopaque foreign body measuring 12 mm in the plantar soft tissues at the level of the fifth metatarsal. There is  no evidence for fracture or dislocation. There is no cortical erosion or periosteal reaction. IMPRESSION: 1. Soft tissue swelling of the forefoot with plantar soft tissue foreign body. 2. Amputation of the first toe. 3. No acute bony abnormality. Electronically Signed   By: Darliss CheneyAmy  Guttmann M.D.   On: 11/17/2021 22:33    Procedures Procedures   Medications Ordered in ED Medications  nitroGLYCERIN (NITROSTAT) SL tablet 0.4 mg (0.4 mg Sublingual Given 11/17/21 2230)  acetaminophen (TYLENOL) 325 MG tablet (has no administration in time range)  nitroGLYCERIN (NITROSTAT) 0.4 MG SL tablet (has no administration in time range)  Tdap (BOOSTRIX) 5-2.5-18.5 LF-MCG/0.5 injection (has no administration in time range)  ceFEPIme (MAXIPIME) 2 g in sodium chloride 0.9 % 100 mL IVPB (2 g Intravenous New Bag/Given 11/17/21 2301)  metroNIDAZOLE (FLAGYL) IVPB 500 mg (500 mg Intravenous New Bag/Given 11/17/21 2301)  vancomycin (VANCOCIN) 2,500 mg in sodium chloride 0.9 % 500 mL IVPB (has no administration in time range)  sodium chloride 0.9 % bolus 500 mL (has no administration in time range)  Tdap (BOOSTRIX) injection 0.5 mL (0.5 mLs Intramuscular Given 11/17/21 2230)  acetaminophen (TYLENOL) tablet 650 mg (650 mg Oral Given 11/17/21 2230)    ED Course  I have reviewed the triage vital signs and the nursing notes.  Pertinent labs & imaging results that were available during my care of the patient were reviewed by me and considered in my medical decision making (see chart for details).    MDM Rules/Calculators/A&P                          50 y/o M here for eval of chest pain and wounds to the bilat feet  Reviewed/interpreted labs CBC with no leukocytosis, mild anemia CMP with hyperglycemia, otherwise reassuring Trop marginally elevated Lactic  elevated  Blood cultures obtained  EKG - Sinus tachycardia Left anterior fascicular block Inferior infarct , age undetermined Anterolateral infarct , age  undetermined Abnormal ECG When compared to prior, faster rate. No STEMI  Reviewed/interpreted imaging CXR - e clear. The visualized skeletal structures are unremarkable. IMPRESSION: No active cardiopulmonary disease. Xray left foot - 1. Prior amputation of the second toe and distal second metatarsal with overlying soft tissue ulceration. 2. No acute bony abnormality Xray right foot - 1. Soft tissue swelling of the forefoot with plantar soft tissue foreign body. 2. Amputation of the first toe. 3. No acute bony abnormality  - no obvious FB noted on exam  11:04 PM CONSULT with Dr. Loney Lohathore who accepts patient for admission  Final Clinical Impression(s) / ED Diagnoses Final diagnoses:  Sepsis, due to unspecified organism, unspecified whether acute organ dysfunction present [facility])  Wound infection    Rx / DC Orders ED Discharge Orders     None        Rodney Booze, PA-C 11/17/21 2319    Rodney Booze, PA-C 11/17/21 2337    Tegeler, Gwenyth Allegra, MD 11/17/21 2340

## 2021-11-17 NOTE — ED Notes (Signed)
Patient transported to X-ray 

## 2021-11-17 NOTE — ED Triage Notes (Signed)
Pt c/o chest pain that started tonight, feels like pressure on chest and sob. Pt took nitro x2 at home without improvement of pain. noted to have redness, discoloration to L foot, hx of neuropathy, reports his dogs have been chewing on his toes.  Has been seen by pcp for infectfion in his food but has missed his last two appts

## 2021-11-18 ENCOUNTER — Inpatient Hospital Stay (HOSPITAL_COMMUNITY): Payer: Medicare HMO

## 2021-11-18 ENCOUNTER — Other Ambulatory Visit: Payer: Self-pay

## 2021-11-18 DIAGNOSIS — L089 Local infection of the skin and subcutaneous tissue, unspecified: Secondary | ICD-10-CM | POA: Diagnosis not present

## 2021-11-18 DIAGNOSIS — A419 Sepsis, unspecified organism: Secondary | ICD-10-CM

## 2021-11-18 DIAGNOSIS — M7989 Other specified soft tissue disorders: Secondary | ICD-10-CM

## 2021-11-18 DIAGNOSIS — L0390 Cellulitis, unspecified: Secondary | ICD-10-CM

## 2021-11-18 DIAGNOSIS — R0600 Dyspnea, unspecified: Secondary | ICD-10-CM

## 2021-11-18 DIAGNOSIS — T148XXA Other injury of unspecified body region, initial encounter: Secondary | ICD-10-CM | POA: Insufficient documentation

## 2021-11-18 DIAGNOSIS — L97524 Non-pressure chronic ulcer of other part of left foot with necrosis of bone: Secondary | ICD-10-CM | POA: Insufficient documentation

## 2021-11-18 DIAGNOSIS — R6520 Severe sepsis without septic shock: Secondary | ICD-10-CM

## 2021-11-18 DIAGNOSIS — R079 Chest pain, unspecified: Secondary | ICD-10-CM

## 2021-11-18 LAB — CBC
HCT: 39.3 % (ref 39.0–52.0)
Hemoglobin: 12.3 g/dL — ABNORMAL LOW (ref 13.0–17.0)
MCH: 25.9 pg — ABNORMAL LOW (ref 26.0–34.0)
MCHC: 31.3 g/dL (ref 30.0–36.0)
MCV: 82.7 fL (ref 80.0–100.0)
Platelets: 312 10*3/uL (ref 150–400)
RBC: 4.75 MIL/uL (ref 4.22–5.81)
RDW: 14.4 % (ref 11.5–15.5)
WBC: 10.5 10*3/uL (ref 4.0–10.5)
nRBC: 0 % (ref 0.0–0.2)

## 2021-11-18 LAB — CBG MONITORING, ED
Glucose-Capillary: 287 mg/dL — ABNORMAL HIGH (ref 70–99)
Glucose-Capillary: 253 mg/dL — ABNORMAL HIGH (ref 70–99)
Glucose-Capillary: 282 mg/dL — ABNORMAL HIGH (ref 70–99)
Glucose-Capillary: 302 mg/dL — ABNORMAL HIGH (ref 70–99)

## 2021-11-18 LAB — SEDIMENTATION RATE: Sed Rate: 45 mm/hr — ABNORMAL HIGH (ref 0–16)

## 2021-11-18 LAB — RESP PANEL BY RT-PCR (FLU A&B, COVID) ARPGX2
Influenza A by PCR: NEGATIVE
Influenza B by PCR: NEGATIVE
SARS Coronavirus 2 by RT PCR: NEGATIVE

## 2021-11-18 LAB — GLUCOSE, CAPILLARY: Glucose-Capillary: 382 mg/dL — ABNORMAL HIGH (ref 70–99)

## 2021-11-18 LAB — LACTIC ACID, PLASMA: Lactic Acid, Venous: 1.5 mmol/L (ref 0.5–1.9)

## 2021-11-18 LAB — C-REACTIVE PROTEIN: CRP: 12.1 mg/dL — ABNORMAL HIGH (ref <1.0)

## 2021-11-18 MED ORDER — INSULIN ASPART 100 UNIT/ML IJ SOLN
0.0000 [IU] | INTRAMUSCULAR | Status: DC
  Administered 2021-11-18 (×3): 5 [IU] via SUBCUTANEOUS

## 2021-11-18 MED ORDER — NALOXONE HCL 4 MG/0.1ML NA LIQD
4.0000 mg | NASAL | Status: DC | PRN
  Filled 2021-11-18: qty 8

## 2021-11-18 MED ORDER — ALBUTEROL SULFATE (2.5 MG/3ML) 0.083% IN NEBU: 3.0000 mL | INHALATION_SOLUTION | Freq: Four times a day (QID) | RESPIRATORY_TRACT | Status: DC | PRN

## 2021-11-18 MED ORDER — ENOXAPARIN SODIUM 40 MG/0.4ML IJ SOSY: 40.0000 mg | PREFILLED_SYRINGE | INTRAMUSCULAR | Status: DC

## 2021-11-18 MED ORDER — INSULIN GLARGINE-YFGN 100 UNIT/ML ~~LOC~~ SOLN
25.0000 [IU] | Freq: Two times a day (BID) | SUBCUTANEOUS | Status: DC
  Administered 2021-11-18 (×2): 25 [IU] via SUBCUTANEOUS
  Filled 2021-11-18 (×7): qty 0.25

## 2021-11-18 MED ORDER — ACETAMINOPHEN 650 MG RE SUPP: 650.0000 mg | Freq: Four times a day (QID) | RECTAL | Status: DC | PRN

## 2021-11-18 MED ORDER — INSULIN ASPART 100 UNIT/ML IJ SOLN
0.0000 [IU] | Freq: Three times a day (TID) | INTRAMUSCULAR | Status: DC
  Administered 2021-11-18 – 2021-11-19 (×2): 7 [IU] via SUBCUTANEOUS

## 2021-11-18 MED ORDER — VANCOMYCIN HCL 1750 MG/350ML IV SOLN
1750.0000 mg | Freq: Two times a day (BID) | INTRAVENOUS | Status: DC
  Administered 2021-11-18 – 2021-11-21 (×5): 1750 mg via INTRAVENOUS
  Filled 2021-11-18 (×8): qty 350

## 2021-11-18 MED ORDER — SODIUM CHLORIDE 0.9 % IV BOLUS
1000.0000 mL | Freq: Once | INTRAVENOUS | Status: AC
  Administered 2021-11-18: 1000 mL via INTRAVENOUS

## 2021-11-18 MED ORDER — SODIUM CHLORIDE 0.9 % IV SOLN
2.0000 g | Freq: Three times a day (TID) | INTRAVENOUS | Status: DC
  Administered 2021-11-18 – 2021-11-21 (×10): 2 g via INTRAVENOUS
  Filled 2021-11-18 (×10): qty 2

## 2021-11-18 MED ORDER — PANTOPRAZOLE SODIUM 40 MG PO TBEC
40.0000 mg | DELAYED_RELEASE_TABLET | Freq: Every day | ORAL | Status: DC
  Administered 2021-11-18 – 2021-11-19 (×2): 40 mg via ORAL
  Filled 2021-11-18 (×3): qty 1

## 2021-11-18 MED ORDER — OXYCODONE HCL 5 MG PO TABS
15.0000 mg | ORAL_TABLET | Freq: Four times a day (QID) | ORAL | Status: DC | PRN
  Filled 2021-11-18: qty 3

## 2021-11-18 MED ORDER — CARVEDILOL 3.125 MG PO TABS
3.1250 mg | ORAL_TABLET | Freq: Two times a day (BID) | ORAL | Status: DC
  Administered 2021-11-18 – 2021-11-26 (×15): 3.125 mg via ORAL
  Filled 2021-11-18 (×16): qty 1

## 2021-11-18 MED ORDER — ATORVASTATIN CALCIUM 80 MG PO TABS
80.0000 mg | ORAL_TABLET | Freq: Every day | ORAL | Status: DC
  Administered 2021-11-18 – 2021-11-25 (×8): 80 mg via ORAL
  Filled 2021-11-18 (×9): qty 1

## 2021-11-18 MED ORDER — OXYCODONE HCL 5 MG PO TABS
15.0000 mg | ORAL_TABLET | Freq: Four times a day (QID) | ORAL | Status: DC | PRN
  Administered 2021-11-18 – 2021-11-21 (×6): 15 mg via ORAL
  Filled 2021-11-18 (×6): qty 3

## 2021-11-18 MED ORDER — ALPRAZOLAM 0.5 MG PO TABS
1.0000 mg | ORAL_TABLET | Freq: Every morning | ORAL | Status: DC
  Administered 2021-11-18 – 2021-11-22 (×4): 1 mg via ORAL
  Filled 2021-11-18: qty 2
  Filled 2021-11-18: qty 4
  Filled 2021-11-18 (×2): qty 2

## 2021-11-18 MED ORDER — BUPROPION HCL ER (SR) 150 MG PO TB12
150.0000 mg | ORAL_TABLET | Freq: Two times a day (BID) | ORAL | Status: DC
  Administered 2021-11-18 – 2021-11-26 (×17): 150 mg via ORAL
  Filled 2021-11-18 (×21): qty 1

## 2021-11-18 MED ORDER — MUPIROCIN 2 % EX OINT: TOPICAL_OINTMENT | Freq: Two times a day (BID) | CUTANEOUS | Status: DC

## 2021-11-18 MED ORDER — ENOXAPARIN SODIUM 80 MG/0.8ML IJ SOSY
80.0000 mg | PREFILLED_SYRINGE | INTRAMUSCULAR | Status: DC
Start: 2021-11-18 — End: 2021-11-26
  Administered 2021-11-18 – 2021-11-25 (×8): 80 mg via SUBCUTANEOUS
  Filled 2021-11-18 (×9): qty 0.8

## 2021-11-18 MED ORDER — RANOLAZINE ER 500 MG PO TB12
500.0000 mg | ORAL_TABLET | Freq: Two times a day (BID) | ORAL | Status: DC
  Administered 2021-11-18 – 2021-11-26 (×17): 500 mg via ORAL
  Filled 2021-11-18 (×19): qty 1

## 2021-11-18 MED ORDER — ALPRAZOLAM 0.5 MG PO TABS
2.0000 mg | ORAL_TABLET | Freq: Every day | ORAL | Status: DC
  Administered 2021-11-18 – 2021-11-21 (×4): 2 mg via ORAL
  Filled 2021-11-18 (×4): qty 4

## 2021-11-18 MED ORDER — METRONIDAZOLE 500 MG/100ML IV SOLN
500.0000 mg | Freq: Two times a day (BID) | INTRAVENOUS | Status: DC
  Administered 2021-11-18 – 2021-11-20 (×6): 500 mg via INTRAVENOUS
  Filled 2021-11-18 (×7): qty 100

## 2021-11-18 MED ORDER — ALPRAZOLAM 0.25 MG PO TABS: 1.0000 mg | ORAL_TABLET | ORAL | Status: DC

## 2021-11-18 MED ORDER — ASPIRIN EC 81 MG PO TBEC
81.0000 mg | DELAYED_RELEASE_TABLET | Freq: Every day | ORAL | Status: DC
  Administered 2021-11-18 – 2021-11-26 (×8): 81 mg via ORAL
  Filled 2021-11-18 (×8): qty 1

## 2021-11-18 MED ORDER — SODIUM CHLORIDE 0.9 % IV SOLN: INTRAVENOUS | Status: DC | PRN

## 2021-11-18 MED ORDER — ACETAMINOPHEN 325 MG PO TABS: 650.0000 mg | ORAL_TABLET | Freq: Four times a day (QID) | ORAL | Status: DC | PRN

## 2021-11-18 MED ORDER — SODIUM CHLORIDE 0.9 % IV SOLN: INTRAVENOUS | Status: AC

## 2021-11-18 NOTE — Progress Notes (Signed)
Pharmacy Antibiotic Note  Joseph [name] is a 50 y.o. male admitted on 11/17/2021 with sepsis.  Pharmacy has been consulted for vancomycin and cefepime dosing.  Plan: Vancomycin 2500mg  x1 then 1750mg  IV Q12H. Goal AUC 400-550.  Expected AUC 515.  SCr 1.14.  Cefepime 2g IV Q8H.  Height: 6\' 5"  (195.6 cm) Weight: (!) 156 kg (344 lb) IBW/kg (Calculated) : 89.1  Temp (24hrs), Avg:100 F (37.8 C), Min:100 F (37.8 C), Max:100 F (37.8 C)  Recent Labs  Lab 11/17/21 2053 11/17/21 2054 11/17/21 2232  WBC 10.4  --   --   CREATININE 1.14  --   --   LATICACIDVEN  --  3.8* 2.6*    Estimated Creatinine Clearance: 129.9 mL/min (by C-G formula based on SCr of 1.14 mg/dL).    Allergies  Allergen Reactions   Sulfa Antibiotics Other (See Comments)    Headaches     Thank you for allowing pharmacy to be a part of this patient's care.  2055, PharmD, BCPS  11/18/2021 4:27 AM

## 2021-11-18 NOTE — Consult Note (Signed)
WOC Nurse Consult Note: Patient receiving care in [facility] ED 17.  Reason for Consult: Profore wrap to RLE. I spoke with Dr. Sharol Given about the order for the profore compression wrap and explained that this will likely be done in the morning. He was fine with that.  I have placed an order in the record for the nursing staff to obtain a profore kit.  Order, and place at the bedside for use on 11/19/21, a [employer], Kellie Simmering [phone]. If the patient gets transferred to an in patient room after the Profore kit arrives, transfer it with the patient.   Val Riles, RN, MSN, CWOCN, CNS-BC, pager [phone]

## 2021-11-18 NOTE — Plan of Care (Signed)
  Problem: Education: Goal: Knowledge of General Education information will improve Description Including pain rating scale, medication(s)/side effects and non-pharmacologic comfort measures Outcome: Progressing   
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
## 2021-11-18 NOTE — ED Notes (Signed)
Wound noted to the top of the right foot and bottom of 3rd and fourth toe has open areas. Open wound noted to the ball of the foot of the left. Wound noted to the 3rd left toe.

## 2021-11-18 NOTE — Progress Notes (Addendum)
Inpatient Diabetes Program Recommendations  AACE/ADA: New Consensus Statement on Inpatient Glycemic Control (2015)  Target Ranges:  Prepandial:   less than 140 mg/dL      Peak postprandial:   less than 180 mg/dL (1-2 hours)      Critically ill patients:  140 - 180 mg/dL    Latest Reference Range & Units 11/18/21 05:09 11/18/21 07:53  Glucose-Capillary 70 - 99 mg/dL 287 (H)  5 units Novolog  253 (H)  5  units Novolog $RemoveBefor'@[id number]'[id number]$     Admit with:  Severe sepsis secondary to left lower leg cellulitis and bilateral foot wounds CP/ SOB  History: DM2, CKD  Home DM Meds: Novolog SSI        Lantus 60 units BID        Victoza 1.8 mg daily        Metformin 1000 mg BID  Current Orders: Novolog Sensitive Correction Scale/ SSI (0-9 units) Q4 hours    A1c pending--last one on file was 11.8% (August 2022)   MD- Please consider restarting basal insulin for patient.  Consider starting Semglee 30 units BID (50% home dose to start)  Please also change Novolog SSi to TID AC + HS (pt allowed PO diet)  Please also consider giving pt a sliding scale to use at home--currently guesses at how much insulin to take and needs more concrete guidelines to follow    Addendum 11:30am--Met w/ pt down in the ED.  Confirmed home meds (see above).  Pt told me he regularly takes his Lantus insulin but is only taking the Novolog about 65% of the time.  Has Freestyle Libre CGM for CBG checks--pays out of pocket for the Freestyle ($38 every 2 weeks) but currently does not have one on--needs to get refill on the sensor after discharge.  Gets care form [facility] and gets all Rxs renewed thru that practice.  Doesn't really check sugars according to a regular regimen--Also does not have a regimen to follow with his Novolog--guesses at how much he should take.  I asked pt why he has never asked his PCP for a more concrete regimen with his Novolog and pt shrugged his shoulders.  Discussed with pt that he  should not be guessing at his insulin doses and that I will ask the Attending MD to give him a more concrete insulin schedule to follow at home for meal time dosing of the Novolog.  Spoke with patient about his current A1c of 11.8% in August and how current A1c is pending.  Explained what an A1c is and what it measures.  Reminded patient that his goal A1c is 7% or less per ADA standards to prevent both acute and long-term complications.  Explained to patient the extreme importance of good glucose control at home in order to heal his current foot wounds and prevent further wounds and infection.  Encouraged patient to check his CBGs at least TID before meals at home and to also check 2 hours after meals since he has the Freestyle CGM and the ability to easily check CBGs.  Reviewed goal CBGs for home and encouraged pt to have regular visits with PCP to better manage his Diabetes.  Also discussed with pt that I will ask the Attending MD to give pt a scale to use at home to better, more accurately dose his Novolog insulin.       --Will follow patient during hospitalization--  Wyn Quaker RN, MSN, CDE Diabetes Coordinator Inpatient Glycemic Control Team  Team Pager: [phone] (8a-5p)
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
## 2021-11-18 NOTE — ED Notes (Addendum)
Recorded CBG 301 mg/dL.

## 2021-11-18 NOTE — Progress Notes (Signed)
Bilateral lower extremity venous duplex has been completed. Preliminary results can be found in CV Proc through chart review.   11/18/21 10:51 AM Olen Cordial RVT

## 2021-11-18 NOTE — Consult Note (Signed)
ORTHOPAEDIC CONSULTATION  REQUESTING PHYSICIAN: Elgergawy, Silver Huguenin, MD  Chief Complaint: Ulcerations bilateral lower extremities.  HPI: Joseph [name] is a 50 y.o. male who presents with chronic ulcerations both lower extremities.  Patient feels like the left foot ulcer has gotten worse as well as recurrence of the right ankle ulcer.  Patient has a history of uncontrolled type 2 diabetes and peripheral vascular disease.  Patient has had a arteriogram back in March and felt to have inline flow to the left foot.  Past Medical History:  Diagnosis Date   Aneurysm of ascending aorta 09/14/2021   Anxiety    Arthritis    "knees, shoulders, hips, ankles" (07/29/2016)   Asthma    Bicuspid aortic valve 09/14/2021   CAD (coronary artery disease)    a. 2017: s/p BMS to distal Cx; b. LHC 05/30/2019: 80% mid, distal RCA s/p DES, 30% narrowing of d LM, widely patent LAD w/ luminal irregularities, widely patent stent in [place]  w/ 90+% stenosis distal to stent beofre small trifurcating obtuse marginal (potentially area of restenosis)   Chronic bronchitis (HCC)    Chronic ulcer of right great toe ([place]) 09/19/2017   COPD (chronic obstructive pulmonary disease) (HCC)    Depression    DVT (deep venous thrombosis) (HCC)    GERD (gastroesophageal reflux disease)    Hyperlipidemia    Hypertension    Myocardial infarction ([place]) 1996   "light one"   PE (pulmonary embolism) 04/2013   On chronic Xarelto   Peripheral nerve disease    Pneumonia "several times"   Sleep apnea    Type II diabetes mellitus ([place])    Past Surgical History:  Procedure Laterality Date   ABDOMINAL AORTOGRAM W/LOWER EXTREMITY N/A 10/10/2020   Procedure: ABDOMINAL AORTOGRAM W/LOWER EXTREMITY;  Surgeon: Elam Dutch, MD;  Location: [facility];  Service: Cardiovascular;  Laterality: N/A;   AMPUTATION Right 09/21/2017   Procedure: RIGHT GREAT TOE AMPUTATION, POSSIBLE VAC;  Surgeon: Leandrew Koyanagi, MD;  Location: [place];   Service: Orthopedics;  Laterality: Right;   AMPUTATION Left 08/13/2020   Procedure: LEFT FOOT 4TH RAY AMPUTATION;  Surgeon: Newt Minion, MD;  Location: [place];  Service: Orthopedics;  Laterality: Left;   AORTOGRAM Bilateral 03/13/2021   Procedure: ABDOMINAL AORTOGRAM WITH Left LOWER EXTREMITY RUNOFF;  Surgeon: Cherre Robins, MD;  Location: [facility] OR;  Service: Vascular;  Laterality: Bilateral;   CARDIAC CATHETERIZATION  2006   [place]   "@ [facility]; when I had my heart attack"   CARDIAC CATHETERIZATION N/A 07/29/2016   Procedure: Left Heart Cath and Coronary Angiography;  Surgeon: Lorretta Harp, MD;  Location: [facility];  Service: Cardiovascular;  Laterality: N/A;   CARDIAC CATHETERIZATION N/A 07/29/2016   Procedure: Coronary Stent Intervention;  Surgeon: Lorretta Harp, MD;  Location: [place] [facility];  Service: Cardiovascular;  Laterality: N/A;   CARPAL TUNNEL RELEASE Bilateral    CORONARY ANGIOPLASTY WITH STENT PLACEMENT  07/29/2016   CORONARY ARTERY BYPASS GRAFT N/A 07/24/2020   Procedure: CORONARY ARTERY BYPASS GRAFTING (CABG), ON PUMP, TIMES FOUR, USING LEFT INTERNAL MAMMARY ARTERY AND ENDOSCOPICALLY HARVESTED RIGHT GREATER SAPHENOUS VEIN;  Surgeon: Lajuana Matte, MD;  Location: [place];  Service: Open Heart Surgery;  Laterality: N/A;  FLOW TAC   CORONARY STENT INTERVENTION N/A 05/30/2019   Procedure: CORONARY STENT INTERVENTION;  Surgeon: Belva Crome, MD;  Location: [place] [facility] [facility];  Service: Cardiovascular;  Laterality: N/A;  CORONARY STENT INTERVENTION N/A 11/02/2019   Procedure: CORONARY STENT INTERVENTION;  Surgeon: Wellington Hampshire, MD;  Location: [place] [facility];  Service: Cardiovascular;  Laterality: N/A;   ESOPHAGOGASTRODUODENOSCOPY N/A 09/22/2017   Procedure: ESOPHAGOGASTRODUODENOSCOPY (EGD);  Surgeon: Ladene Artist, MD;  Location: [facility];  Service: Endoscopy;  Laterality: N/A;   INTRAVASCULAR PRESSURE WIRE/FFR STUDY N/A  07/22/2020   Procedure: INTRAVASCULAR PRESSURE WIRE/FFR STUDY;  Surgeon: Leonie Man, MD;  Location: [place] [facility];  Service: Cardiovascular;  Laterality: N/A;   KNEE ARTHROSCOPY Bilateral    "2 on left; 1 on the right"   LEFT HEART CATH AND CORONARY ANGIOGRAPHY N/A 11/02/2019   Procedure: LEFT HEART CATH AND CORONARY ANGIOGRAPHY;  Surgeon: Wellington Hampshire, MD;  Location: [place];  Service: Cardiovascular;  Laterality: N/A;   LEFT HEART CATH AND CORONARY ANGIOGRAPHY N/A 07/22/2020   Procedure: LEFT HEART CATH AND CORONARY ANGIOGRAPHY;  Surgeon: Leonie Man, MD;  Location: [place] [facility];  Service: Cardiovascular;  Laterality: N/A;   LEFT HEART CATH AND CORS/GRAFTS ANGIOGRAPHY N/A 08/17/2021   Procedure: LEFT HEART CATH AND CORS/GRAFTS ANGIOGRAPHY;  Surgeon: Nelva Bush, MD;  Location: [place] [facility];  Service: Cardiovascular;  Laterality: N/A;   PERIPHERAL VASCULAR INTERVENTION Left 10/11/2020   popliteal and SFA stent placement    PERIPHERAL VASCULAR INTERVENTION Left 10/10/2020   Procedure: PERIPHERAL VASCULAR INTERVENTION;  Surgeon: Elam Dutch, MD;  Location: [facility] [facility];  Service: Cardiovascular;  Laterality: Left;   RIGHT/LEFT HEART CATH AND CORONARY ANGIOGRAPHY N/A 05/30/2019   Procedure: RIGHT/LEFT HEART CATH AND CORONARY ANGIOGRAPHY;  Surgeon: Belva Crome, MD;  Location: [facility];  Service: Cardiovascular;  Laterality: N/A;   SHOULDER OPEN ROTATOR CUFF REPAIR Bilateral    TEE WITHOUT CARDIOVERSION N/A 07/24/2020   Procedure: TRANSESOPHAGEAL ECHOCARDIOGRAM (TEE);  Surgeon: Lajuana Matte, MD;  Location: [place];  Service: Open Heart Surgery;  Laterality: N/A;   Social History   Socioeconomic History   Marital status: Married    Spouse name: Not on file   Number of children: Not on file   Years of education: Not on file   Highest education level: Not on file  Occupational History   Occupation: disabled     Employer:  UNEMPLOYED  Tobacco Use   Smoking status: Former    Packs/day: 1.00    Years: 34.00    Pack years: 34.00    Types: Cigarettes    Quit date: 07/12/2020    Years since quitting: 1.3   Smokeless tobacco: Former    Types: Snuff, Chew  Vaping Use   Vaping Use: Never used  Substance and Sexual Activity   Alcohol use: Yes    Comment: RARE   Drug use: No   Sexual activity: Yes  Other Topics Concern   Not on file  Social History Narrative   Not on file   Social Determinants of Health   Financial Resource Strain: Not on file  Food Insecurity: Not on file  Transportation Needs: Not on file  Physical Activity: Not on file  Stress: Not on file  Social Connections: Not on file   Family History  Problem Relation Age of Onset   Hypertension Brother    Hypertension Father    Diabetes Other    Hyperlipidemia Other    - negative except otherwise stated in the family history section Allergies  Allergen Reactions   Sulfa Antibiotics Other (See Comments)  Headaches    Prior to Admission medications   Medication Sig Start Date End Date Taking? Authorizing Provider  albuterol (VENTOLIN HFA) 108 (90 Base) MCG/ACT inhaler Inhale 2 puffs into the lungs every 6 (six) hours as needed for wheezing or shortness of breath.   Yes [provider]  ALPRAZolam Duanne Moron) 1 MG tablet Take 1-2 mg by mouth See admin instructions. 1 mg in the morning  2 mg at bedtime   Yes [provider]  aspirin EC 81 MG tablet Take 81 mg by mouth daily. Swallow whole.   Yes [provider]  atorvastatin (LIPITOR) 80 MG tablet Take 1 tablet (80 mg total) by mouth daily at 6 PM. 05/31/19  Yes Sande Rives E, PA-C  buPROPion (WELLBUTRIN SR) 150 MG 12 hr tablet Take 150 mg by mouth 2 (two) times daily.  08/17/16  Yes [provider]  carvedilol (COREG) 3.125 MG tablet Take 3.125 mg by mouth 2 (two) times daily. 11/17/21  Yes [provider]  clopidogrel (PLAVIX) 75 MG tablet  Take 1 tablet (75 mg total) by mouth daily with breakfast. 10/11/20  Yes Laurence Slate M, PA-C  escitalopram (LEXAPRO) 10 MG tablet Take 10 mg by mouth in the morning. 06/10/20  Yes [provider]  fluconazole (DIFLUCAN) 100 MG tablet Take 100 mg by mouth every Monday. 08/30/21  Yes [provider]  fluticasone (FLONASE) 50 MCG/ACT nasal spray Place 2 sprays into both nostrils daily.   Yes [provider]  furosemide (LASIX) 40 MG tablet Take 1 tablet (40 mg total) by mouth daily. 05/13/21 11/18/21 Yes Skeet Latch, MD  icosapent Ethyl (VASCEPA) 1 g capsule Take 2 capsules (2 g total) by mouth 2 (two) times daily. 09/02/21  Yes Skeet Latch, MD  insulin aspart (NOVOLOG) 100 UNIT/ML FlexPen Inject 12 Units into the skin 3 (three) times daily with meals. Patient taking differently: Inject into the skin See admin instructions. Per sliding scale 07/13/21  Yes Barton Dubois, MD  insulin glargine (LANTUS) 100 UNIT/ML injection Inject 60 Units into the skin 2 (two) times daily.   Yes [provider]  isosorbide mononitrate (IMDUR) 60 MG 24 hr tablet Take 2 tablets (120 mg total) by mouth daily. 08/21/21  Yes Kathyrn Drown D, NP  liraglutide (VICTOZA) 18 MG/3ML SOPN Inject 1.8 mg into the skin daily. 07/17/21  Yes Manuella Ghazi, Pratik D, DO  meclizine (ANTIVERT) 25 MG tablet Take 50 mg by mouth daily with breakfast. 04/10/13  Yes [provider]  meclizine (ANTIVERT) 25 MG tablet Take 25 mg by mouth at bedtime.   Yes [provider]  metFORMIN (GLUCOPHAGE) 1000 MG tablet Take 1 tablet (1,000 mg total) by mouth 2 (two) times daily with a meal. 11/05/19  Yes Isaiah Serge, NP  mupirocin ointment (BACTROBAN) 2 % Place 1 application into the nose 2 (two) times daily. Patient taking differently: Place 1 application into the nose 2 (two) times daily. toes 08/20/21  Yes Kathyrn Drown D, NP  naloxone [facility]) nasal spray 4 mg/0.1 mL Place 4 mg into the nose as  needed (overdose).   Yes [provider]  nitroGLYCERIN (NITROSTAT) 0.4 MG SL tablet Place 0.4 mg under the tongue every 5 (five) minutes as needed for chest pain.   Yes [provider]  oxyCODONE (ROXICODONE) 15 MG immediate release tablet Take 1 tablet (15 mg total) by mouth 5 (five) times daily. 03/14/21  Yes Jeralyn Bennett, MD  pantoprazole (PROTONIX) 40 MG tablet Take 1 tablet (  40 mg total) by mouth daily. Patient taking differently: Take 80 mg by mouth daily. 09/23/17  Yes Hongalgi, Maximino Greenland, MD  pregabalin (LYRICA) 150 MG capsule Take 300 mg by mouth in the morning. 05/06/19  Yes [provider]  pregabalin (LYRICA) 150 MG capsule Take 150 mg by mouth at bedtime.   Yes [provider]  ranolazine (RANEXA) 500 MG 12 hr tablet Take 1 tablet (500 mg total) by mouth 2 (two) times daily. 09/02/21  Yes Chilton Si, MD  rosuvastatin (CRESTOR) 40 MG tablet Take 1 tablet (40 mg total) by mouth every evening. 08/20/21  Yes Georgie Chard D, NP  carvedilol (COREG) 12.5 MG tablet Take 1 tablet (12.5 mg total) by mouth 2 (two) times daily. Patient not taking: Reported on 11/18/2021 08/20/21   Filbert Schilder, NP  mupirocin ointment (BACTROBAN) 2 % Apply topically 2 (two) times daily. Patient not taking: Reported on 11/18/2021 08/20/21   Filbert Schilder, NP   DG Chest 2 View  Result Date: 11/17/2021 CLINICAL DATA:  Chest pain. EXAM: CHEST - 2 VIEW COMPARISON:  Chest x-ray 08/15/2021. FINDINGS: Sternotomy wires are again seen. The heart size and mediastinal contours are within normal limits. Both lungs are clear. The visualized skeletal structures are unremarkable. IMPRESSION: No active cardiopulmonary disease. Electronically Signed   By: Darliss Cheney M.D.   On: 11/17/2021 22:29   DG Foot Complete Left  Result Date: 11/17/2021 CLINICAL DATA:  Concern for osteomyelitis. EXAM: LEFT FOOT - COMPLETE 3+ VIEW COMPARISON:  Left toe x-ray 10/21/2021 3 FINDINGS: There is no  acute fracture or dislocation. There is been prior amputation of the distal second metatarsal and phalanx. Soft tissue ulceration is seen in this region. There is no cortical erosion or periosteal reaction. There is no acute fracture or dislocation. IMPRESSION: 1. Prior amputation of the second toe and distal second metatarsal with overlying soft tissue ulceration. 2. No acute bony abnormality. Electronically Signed   By: Darliss Cheney M.D.   On: 11/17/2021 22:31   DG Foot Complete Right  Result Date: 11/17/2021 CLINICAL DATA:  Wound. EXAM: RIGHT FOOT COMPLETE - 3+ VIEW COMPARISON:  None. FINDINGS: There has been prior amputation of the first toe. There is soft tissue swelling of the forefoot. There is a linear radiopaque foreign body measuring 12 mm in the plantar soft tissues at the level of the fifth metatarsal. There is no evidence for fracture or dislocation. There is no cortical erosion or periosteal reaction. IMPRESSION: 1. Soft tissue swelling of the forefoot with plantar soft tissue foreign body. 2. Amputation of the first toe. 3. No acute bony abnormality. Electronically Signed   By: Darliss Cheney M.D.   On: 11/17/2021 22:33   VAS [place] LOWER EXTREMITY VENOUS (DVT)  Result Date: 11/18/2021  Lower Venous DVT Study Patient Name:  HILDA WEXLER [facility]  Date of Exam:   11/18/2021 Medical Rec #: [id number]        Accession #:    [id number] Date of Birth: March 16, 1971        Patient Gender: M Patient Age:   28 years Exam Location:  [facility] Procedure:      VAS [place] LOWER EXTREMITY VENOUS (DVT) Referring Phys: Ulyess Blossom RATHORE --------------------------------------------------------------------------------  Indications: Swelling.  Risk Factors: None identified. Limitations: Body habitus and poor ultrasound/tissue interface. Comparison Study: No prior studies. Performing Technologist: Chanda Busing RVT  Examination Guidelines: A complete evaluation includes B-mode imaging, spectral Doppler, color  Doppler, and power Doppler as needed of  all accessible portions of each vessel. Bilateral testing is considered an integral part of a complete examination. Limited examinations for reoccurring indications may be performed as noted. The reflux portion of the exam is performed with the patient in reverse Trendelenburg.  +---------+---------------+---------+-----------+----------+--------------+ RIGHT    CompressibilityPhasicitySpontaneityPropertiesThrombus Aging +---------+---------------+---------+-----------+----------+--------------+ CFV      Full           Yes      Yes                                 +---------+---------------+---------+-----------+----------+--------------+ SFJ      Full                                                        +---------+---------------+---------+-----------+----------+--------------+ FV Prox  Full                                                        +---------+---------------+---------+-----------+----------+--------------+ FV Mid   Full                                                        +---------+---------------+---------+-----------+----------+--------------+ FV DistalFull                                                        +---------+---------------+---------+-----------+----------+--------------+ PFV      Full                                                        +---------+---------------+---------+-----------+----------+--------------+ POP      Full           Yes      Yes                                 +---------+---------------+---------+-----------+----------+--------------+ PTV      Full                                                        +---------+---------------+---------+-----------+----------+--------------+ PERO     Full                                                        +---------+---------------+---------+-----------+----------+--------------+    +---------+---------------+---------+-----------+----------+--------------+ LEFT  CompressibilityPhasicitySpontaneityPropertiesThrombus Aging +---------+---------------+---------+-----------+----------+--------------+ CFV      Full           Yes      Yes                                 +---------+---------------+---------+-----------+----------+--------------+ SFJ      Full                                                        +---------+---------------+---------+-----------+----------+--------------+ FV Prox  Full                                                        +---------+---------------+---------+-----------+----------+--------------+ FV Mid   Full                                                        +---------+---------------+---------+-----------+----------+--------------+ FV DistalFull                                                        +---------+---------------+---------+-----------+----------+--------------+ PFV      Full                                                        +---------+---------------+---------+-----------+----------+--------------+ POP      Full           Yes      Yes                                 +---------+---------------+---------+-----------+----------+--------------+ PTV      Full                                                        +---------+---------------+---------+-----------+----------+--------------+ PERO     Full                                                        +---------+---------------+---------+-----------+----------+--------------+    Summary: RIGHT: - There is no evidence of deep vein thrombosis in the lower extremity. However, portions of this examination were limited- see technologist comments above.  - No cystic structure found in the popliteal fossa.  LEFT: - There is no evidence of deep vein thrombosis in the lower  extremity. However, portions of this examination were  limited- see technologist comments above.  - No cystic structure found in the popliteal fossa.  *See table(s) above for measurements and observations.    Preliminary    - pertinent xrays, CT, MRI studies were reviewed and independently interpreted  Positive ROS: All other systems have been reviewed and were otherwise negative with the exception of those mentioned in the HPI and as above.  Physical Exam: General: Alert, no acute distress Psychiatric: Patient is competent for consent with normal mood and affect Lymphatic: No axillary or cervical lymphadenopathy Cardiovascular: No pedal edema Respiratory: No cyanosis, no use of accessory musculature GI: No organomegaly, abdomen is soft and non-tender    Images:  @ENCIMAGES @  Labs:  Lab Results  Component Value Date   HGBA1C 11.8 (H) 08/15/2021   HGBA1C 13.7 (H) 07/10/2021   HGBA1C 9.9 (H) 02/18/2021   ESRSEDRATE 45 (H) 11/18/2021   ESRSEDRATE 41 (H) 09/16/2017   CRP 12.1 (H) 11/18/2021   CRP 8.9 (H) 09/16/2017   REPTSTATUS PENDING 11/17/2021   REPTSTATUS PENDING 11/17/2021   CULT  11/17/2021    NO GROWTH < 12 HOURS Performed at [facility], [place] [address] [place], [place] [address]    CULT  11/17/2021    NO GROWTH < 12 HOURS Performed at [facility] [address] [place], [place] [address]     Lab Results  Component Value Date   ALBUMIN 2.6 (L) 11/17/2021   ALBUMIN 3.1 (L) 07/17/2021   ALBUMIN 2.7 (L) 07/09/2021     CBC EXTENDED Latest Ref Rng & Units 11/18/2021 11/17/2021 08/19/2021  WBC 4.0 - 10.5 K/uL 10.5 10.4 9.2  RBC 4.22 - 5.81 MIL/uL 4.75 4.85 4.48  HGB 13.0 - 17.0 g/dL 12.3(L) 12.7(L) 11.9(L)  HCT 39.0 - 52.0 % 39.3 40.5 37.9(L)  PLT 150 - 400 K/uL 312 322 309  NEUTROABS 1.7 - 7.7 K/uL - - -  LYMPHSABS 0.7 - 4.0 K/uL - - -    Neurologic: Patient does not have protective sensation bilateral lower extremities.   MUSCULOSKELETAL:   Skin: Examination patient has a necrotic ulcer  beneath the second and third metatarsal head of the left foot.  This probes to bone.  There is pitting edema of the entire left lower extremity with dermatitis.  I cannot palpate a pulse.  Arteriogram in March showed inline flow through the posterior tibial artery.  Examination of the right foot patient has a recurrent chronic ulcer dorsally over the ankle.  White cell count 10.5 hemoglobin 12.3 hemoglobin A1c 11.8 back in August.  Radiographs do not show any destructive bony changes.  Assessment: Assessment: Diabetic insensate neuropathy poorly controlled with peripheral vascular disease and a chronic ulcer beneath the second and third metatarsal head on the left and recurrent ulcer over the dorsum of the right ankle.  Plan: Plan: I have ordered an MRI scan of the left foot.  Anticipate patient may require a transmetatarsal amputation on the left.  I will write orders for compression wraps for the right lower extremity.  Thank you for the consult and the opportunity to see Mr. Demonta Bumgardner, [place] [phone] 2:10 PM

## 2021-11-19 DIAGNOSIS — A419 Sepsis, unspecified organism: Principal | ICD-10-CM

## 2021-11-19 DIAGNOSIS — T148XXA Other injury of unspecified body region, initial encounter: Secondary | ICD-10-CM | POA: Diagnosis not present

## 2021-11-19 DIAGNOSIS — E782 Mixed hyperlipidemia: Secondary | ICD-10-CM | POA: Diagnosis not present

## 2021-11-19 DIAGNOSIS — L0390 Cellulitis, unspecified: Secondary | ICD-10-CM | POA: Diagnosis not present

## 2021-11-19 LAB — GLUCOSE, CAPILLARY
Glucose-Capillary: 339 mg/dL — ABNORMAL HIGH (ref 70–99)
Glucose-Capillary: 381 mg/dL — ABNORMAL HIGH (ref 70–99)
Glucose-Capillary: 296 mg/dL — ABNORMAL HIGH (ref 70–99)
Glucose-Capillary: 252 mg/dL — ABNORMAL HIGH (ref 70–99)

## 2021-11-19 LAB — SURGICAL PCR SCREEN
MRSA, PCR: NEGATIVE
Staphylococcus aureus: POSITIVE — AB

## 2021-11-19 LAB — HEMOGLOBIN A1C
Hgb A1c MFr Bld: 10 % — ABNORMAL HIGH (ref 4.8–5.6)
Mean Plasma Glucose: 240 mg/dL

## 2021-11-19 MED ORDER — POVIDONE-IODINE 10 % EX SWAB: 2.0000 "application " | Freq: Once | CUTANEOUS | Status: DC

## 2021-11-19 MED ORDER — INSULIN ASPART 100 UNIT/ML IJ SOLN
0.0000 [IU] | Freq: Every day | INTRAMUSCULAR | Status: DC
  Administered 2021-11-19 – 2021-11-20 (×2): 3 [IU] via SUBCUTANEOUS
  Administered 2021-11-21 – 2021-11-22 (×2): 2 [IU] via SUBCUTANEOUS

## 2021-11-19 MED ORDER — INSULIN GLARGINE-YFGN 100 UNIT/ML ~~LOC~~ SOLN
20.0000 [IU] | Freq: Once | SUBCUTANEOUS | Status: AC
  Administered 2021-11-19: 20 [IU] via SUBCUTANEOUS
  Filled 2021-11-19: qty 0.2

## 2021-11-19 MED ORDER — CHLORHEXIDINE GLUCONATE 4 % EX LIQD: 60.0000 mL | Freq: Once | CUTANEOUS | Status: DC

## 2021-11-19 MED ORDER — MUPIROCIN 2 % EX OINT
1.0000 "application " | TOPICAL_OINTMENT | Freq: Two times a day (BID) | CUTANEOUS | Status: AC
  Administered 2021-11-19 – 2021-11-24 (×9): 1 via NASAL
  Filled 2021-11-19 (×2): qty 22

## 2021-11-19 MED ORDER — INSULIN ASPART 100 UNIT/ML IJ SOLN
0.0000 [IU] | Freq: Three times a day (TID) | INTRAMUSCULAR | Status: DC
  Administered 2021-11-19: 11 [IU] via SUBCUTANEOUS
  Administered 2021-11-19: 20 [IU] via SUBCUTANEOUS
  Administered 2021-11-20: 4 [IU] via SUBCUTANEOUS
  Administered 2021-11-20: 7 [IU] via SUBCUTANEOUS
  Administered 2021-11-20: 11 [IU] via SUBCUTANEOUS
  Administered 2021-11-21: 7 [IU] via SUBCUTANEOUS
  Administered 2021-11-21: 15 [IU] via SUBCUTANEOUS
  Administered 2021-11-21 – 2021-11-23 (×5): 7 [IU] via SUBCUTANEOUS
  Administered 2021-11-23: 4 [IU] via SUBCUTANEOUS
  Administered 2021-11-23: 15 [IU] via SUBCUTANEOUS
  Administered 2021-11-24: 3 [IU] via SUBCUTANEOUS
  Administered 2021-11-25 – 2021-11-26 (×3): 4 [IU] via SUBCUTANEOUS

## 2021-11-19 MED ORDER — CEFAZOLIN IN SODIUM CHLORIDE 3-0.9 GM/100ML-% IV SOLN
3.0000 g | INTRAVENOUS | Status: DC
  Filled 2021-11-19: qty 100

## 2021-11-19 MED ORDER — INSULIN GLARGINE-YFGN 100 UNIT/ML ~~LOC~~ SOLN: 40.0000 [IU] | Freq: Two times a day (BID) | SUBCUTANEOUS | Status: DC

## 2021-11-19 MED ORDER — CHLORHEXIDINE GLUCONATE CLOTH 2 % EX PADS
6.0000 | MEDICATED_PAD | Freq: Every day | CUTANEOUS | Status: AC
  Administered 2021-11-19 – 2021-11-23 (×4): 6 via TOPICAL

## 2021-11-19 MED ORDER — CEFAZOLIN IN SODIUM CHLORIDE 3-0.9 GM/100ML-% IV SOLN: 3.0000 g | INTRAVENOUS | Status: DC

## 2021-11-19 MED ORDER — INSULIN GLARGINE-YFGN 100 UNIT/ML ~~LOC~~ SOLN: 15.0000 [IU] | Freq: Once | SUBCUTANEOUS | Status: DC

## 2021-11-19 MED ORDER — POVIDONE-IODINE 10 % EX SWAB
2.0000 "application " | Freq: Once | CUTANEOUS | Status: AC
  Administered 2021-11-20: 2 via TOPICAL

## 2021-11-19 MED ORDER — INSULIN GLARGINE-YFGN 100 UNIT/ML ~~LOC~~ SOLN
45.0000 [IU] | Freq: Two times a day (BID) | SUBCUTANEOUS | Status: DC
  Administered 2021-11-19 – 2021-11-20 (×2): 45 [IU] via SUBCUTANEOUS
  Filled 2021-11-19 (×5): qty 0.45
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
## 2021-11-19 NOTE — Consult Note (Signed)
WOC Nurse Consult Note: Patient receiving care in [facility] [phone]. Reason for Consult: 4 layer Profore compression wrap application to RLE Wound type: recurrent venous stasis ulcer to right dorsal foot/ankle area Pressure Injury POA: Yes/No/NA Measurement: 10 cm x 9 cm Wound bed: maroon and yellow Drainage (amount, consistency, odor) none at time of my visit Periwound: see photo Dressing procedure/placement/frequency: Aquacel placed over wound, then 4 layer Profore wrap. Patient was able to hold leg in position for application.  Helmut Muster, RN, MSN, CWOCN, CNS-BC, pager [phone]

## 2021-11-19 NOTE — Progress Notes (Signed)
Patient ID: Joseph [name], male   DOB: [birth year], 50 y.o.   MRN: [id number] Patient is a 50 year old gentleman who is seen in follow-up for Wagner grade 3 ulcer left foot that extends down to the second and third metatarsal head.  MRI scan is reviewed which shows early edema in the bone and ulceration that extends down to bone.  I have recommended patient to proceed with a transmetatarsal amputation.  The importance of nonweightbearing postoperatively was discussed.  Risk of the wound breakdown need for additional surgery.  Patient understands wished to proceed at this time we will plan for transmetatarsal amputation left foot tomorrow Friday.

## 2021-11-19 NOTE — H&P (View-Only) (Signed)
Patient ID: Joseph [name], male   DOB: [birth year], 50 y.o.   MRN: [id number] Patient is a 50 year old gentleman who is seen in follow-up for Wagner grade 3 ulcer left foot that extends down to the second and third metatarsal head.  MRI scan is reviewed which shows early edema in the bone and ulceration that extends down to bone.  I have recommended patient to proceed with a transmetatarsal amputation.  The importance of nonweightbearing postoperatively was discussed.  Risk of the wound breakdown need for additional surgery.  Patient understands wished to proceed at this time we will plan for transmetatarsal amputation left foot tomorrow Friday.

## 2021-11-20 ENCOUNTER — Inpatient Hospital Stay (HOSPITAL_COMMUNITY): Payer: Medicare HMO | Admitting: Anesthesiology

## 2021-11-20 ENCOUNTER — Encounter (HOSPITAL_COMMUNITY)
Admission: EM | Disposition: A | Discharge status: Skilled Nursing Facility | Payer: Self-pay | Source: Home / Self Care | Attending: Internal Medicine

## 2021-11-20 ENCOUNTER — Encounter (HOSPITAL_COMMUNITY): Payer: Self-pay | Admitting: Internal Medicine

## 2021-11-20 DIAGNOSIS — M869 Osteomyelitis, unspecified: Secondary | ICD-10-CM | POA: Diagnosis not present

## 2021-11-20 DIAGNOSIS — L03116 Cellulitis of left lower limb: Secondary | ICD-10-CM | POA: Diagnosis not present

## 2021-11-20 DIAGNOSIS — L97524 Non-pressure chronic ulcer of other part of left foot with necrosis of bone: Secondary | ICD-10-CM | POA: Diagnosis not present

## 2021-11-20 DIAGNOSIS — T148XXA Other injury of unspecified body region, initial encounter: Secondary | ICD-10-CM | POA: Diagnosis not present

## 2021-11-20 DIAGNOSIS — L089 Local infection of the skin and subcutaneous tissue, unspecified: Secondary | ICD-10-CM | POA: Diagnosis not present

## 2021-11-20 HISTORY — PX: AMPUTATION: SHX166

## 2021-11-20 LAB — BASIC METABOLIC PANEL
Anion gap: 9 (ref 5–15)
BUN: 21 mg/dL — ABNORMAL HIGH (ref 6–20)
CO2: 24 mmol/L (ref 22–32)
Calcium: 8.5 mg/dL — ABNORMAL LOW (ref 8.9–10.3)
Chloride: 100 mmol/L (ref 98–111)
Creatinine, Ser: 1.72 mg/dL — ABNORMAL HIGH (ref 0.61–1.24)
GFR, Estimated: 48 mL/min — ABNORMAL LOW (ref >60)
Glucose, Bld: 280 mg/dL — ABNORMAL HIGH (ref 70–99)
Potassium: 5.4 mmol/L — ABNORMAL HIGH (ref 3.5–5.1)
Sodium: 133 mmol/L — ABNORMAL LOW (ref 135–145)

## 2021-11-20 LAB — CBC
HCT: 36.8 % — ABNORMAL LOW (ref 39.0–52.0)
Hemoglobin: 11.6 g/dL — ABNORMAL LOW (ref 13.0–17.0)
MCH: 25.6 pg — ABNORMAL LOW (ref 26.0–34.0)
MCHC: 31.5 g/dL (ref 30.0–36.0)
MCV: 81.2 fL (ref 80.0–100.0)
Platelets: 325 10*3/uL (ref 150–400)
RBC: 4.53 MIL/uL (ref 4.22–5.81)
RDW: 14.4 % (ref 11.5–15.5)
WBC: 14.6 10*3/uL — ABNORMAL HIGH (ref 4.0–10.5)
nRBC: 0 % (ref 0.0–0.2)

## 2021-11-20 LAB — GLUCOSE, CAPILLARY
Glucose-Capillary: 298 mg/dL — ABNORMAL HIGH (ref 70–99)
Glucose-Capillary: 276 mg/dL — ABNORMAL HIGH (ref 70–99)
Glucose-Capillary: 212 mg/dL — ABNORMAL HIGH (ref 70–99)
Glucose-Capillary: 218 mg/dL — ABNORMAL HIGH (ref 70–99)
Glucose-Capillary: 182 mg/dL — ABNORMAL HIGH (ref 70–99)
Glucose-Capillary: 250 mg/dL — ABNORMAL HIGH (ref 70–99)
Glucose-Capillary: 293 mg/dL — ABNORMAL HIGH (ref 70–99)

## 2021-11-20 SURGERY — AMPUTATION, FOOT, PARTIAL
Anesthesia: Regional | Site: Foot | Laterality: Left

## 2021-11-20 MED ORDER — METOPROLOL TARTRATE 5 MG/5ML IV SOLN: 2.0000 mg | INTRAVENOUS | Status: DC | PRN

## 2021-11-20 MED ORDER — LABETALOL HCL 5 MG/ML IV SOLN: 10.0000 mg | INTRAVENOUS | Status: DC | PRN

## 2021-11-20 MED ORDER — ORAL CARE MOUTH RINSE: 15.0000 mL | Freq: Once | OROMUCOSAL | Status: AC

## 2021-11-20 MED ORDER — PHENOL 1.4 % MT LIQD: 1.0000 | OROMUCOSAL | Status: DC | PRN

## 2021-11-20 MED ORDER — CHLORHEXIDINE GLUCONATE 0.12 % MT SOLN
OROMUCOSAL | Status: AC
  Administered 2021-11-20: 15 mL via OROMUCOSAL
  Filled 2021-11-20: qty 15

## 2021-11-20 MED ORDER — CLOPIDOGREL BISULFATE 75 MG PO TABS
75.0000 mg | ORAL_TABLET | Freq: Every day | ORAL | Status: DC
  Administered 2021-11-21 – 2021-11-26 (×6): 75 mg via ORAL
  Filled 2021-11-20 (×6): qty 1

## 2021-11-20 MED ORDER — SODIUM CHLORIDE 0.9 % IV SOLN: INTRAVENOUS | Status: AC

## 2021-11-20 MED ORDER — SODIUM ZIRCONIUM CYCLOSILICATE 10 G PO PACK
10.0000 g | PACK | ORAL | Status: AC
  Administered 2021-11-20: 10 g via ORAL
  Filled 2021-11-20: qty 1

## 2021-11-20 MED ORDER — OXYCODONE HCL 5 MG/5ML PO SOLN: 5.0000 mg | Freq: Once | ORAL | Status: DC | PRN

## 2021-11-20 MED ORDER — DOCUSATE SODIUM 100 MG PO CAPS
100.0000 mg | ORAL_CAPSULE | Freq: Every day | ORAL | Status: DC
  Administered 2021-11-20 – 2021-11-26 (×7): 100 mg via ORAL
  Filled 2021-11-20 (×7): qty 1

## 2021-11-20 MED ORDER — BISACODYL 5 MG PO TBEC: 5.0000 mg | DELAYED_RELEASE_TABLET | Freq: Every day | ORAL | Status: DC | PRN

## 2021-11-20 MED ORDER — PROPOFOL 10 MG/ML IV BOLUS
INTRAVENOUS | Status: AC
  Filled 2021-11-20: qty 20

## 2021-11-20 MED ORDER — POTASSIUM CHLORIDE CRYS ER 20 MEQ PO TBCR: 20.0000 meq | EXTENDED_RELEASE_TABLET | Freq: Every day | ORAL | Status: DC | PRN

## 2021-11-20 MED ORDER — ALUM & MAG HYDROXIDE-SIMETH 200-200-20 MG/5ML PO SUSP: 15.0000 mL | ORAL | Status: DC | PRN

## 2021-11-20 MED ORDER — GUAIFENESIN-DM 100-10 MG/5ML PO SYRP: 15.0000 mL | ORAL_SOLUTION | ORAL | Status: DC | PRN

## 2021-11-20 MED ORDER — MIDAZOLAM HCL 2 MG/2ML IJ SOLN
INTRAMUSCULAR | Status: AC
  Administered 2021-11-20: 2 mg via INTRAVENOUS
  Filled 2021-11-20: qty 2

## 2021-11-20 MED ORDER — CLOPIDOGREL BISULFATE 75 MG PO TABS
75.0000 mg | ORAL_TABLET | Freq: Every day | ORAL | Status: DC
Start: 2021-11-21 — End: 2021-11-20

## 2021-11-20 MED ORDER — ONDANSETRON HCL 4 MG/2ML IJ SOLN
INTRAMUSCULAR | Status: DC | PRN
  Administered 2021-11-20: 4 mg via INTRAVENOUS

## 2021-11-20 MED ORDER — MAGNESIUM CITRATE PO SOLN
1.0000 | Freq: Once | ORAL | Status: DC | PRN
  Filled 2021-11-20: qty 296

## 2021-11-20 MED ORDER — ACETAMINOPHEN 500 MG PO TABS
ORAL_TABLET | ORAL | Status: AC
  Administered 2021-11-20: 1000 mg via ORAL
  Filled 2021-11-20: qty 2

## 2021-11-20 MED ORDER — FENTANYL CITRATE (PF) 100 MCG/2ML IJ SOLN
INTRAMUSCULAR | Status: AC
  Administered 2021-11-20: 50 ug via INTRAVENOUS
  Filled 2021-11-20: qty 2

## 2021-11-20 MED ORDER — ONDANSETRON HCL 4 MG/2ML IJ SOLN: 4.0000 mg | Freq: Four times a day (QID) | INTRAMUSCULAR | Status: DC | PRN

## 2021-11-20 MED ORDER — 0.9 % SODIUM CHLORIDE (POUR BTL) OPTIME
TOPICAL | Status: DC | PRN
  Administered 2021-11-20: 1000 mL

## 2021-11-20 MED ORDER — ACETAMINOPHEN 500 MG PO TABS: 1000.0000 mg | ORAL_TABLET | Freq: Once | ORAL | Status: AC

## 2021-11-20 MED ORDER — PANTOPRAZOLE SODIUM 40 MG PO TBEC
40.0000 mg | DELAYED_RELEASE_TABLET | Freq: Every day | ORAL | Status: DC
  Administered 2021-11-20 – 2021-11-26 (×7): 40 mg via ORAL
  Filled 2021-11-20 (×7): qty 1

## 2021-11-20 MED ORDER — FENTANYL CITRATE (PF) 250 MCG/5ML IJ SOLN
INTRAMUSCULAR | Status: AC
  Filled 2021-11-20: qty 5

## 2021-11-20 MED ORDER — MAGNESIUM SULFATE 2 GM/50ML IV SOLN: 2.0000 g | Freq: Every day | INTRAVENOUS | Status: DC | PRN

## 2021-11-20 MED ORDER — CHLORHEXIDINE GLUCONATE 0.12 % MT SOLN: 15.0000 mL | Freq: Once | OROMUCOSAL | Status: AC

## 2021-11-20 MED ORDER — SODIUM CHLORIDE 0.9 % IV SOLN: INTRAVENOUS | Status: DC

## 2021-11-20 MED ORDER — JUVEN PO PACK
1.0000 | PACK | Freq: Two times a day (BID) | ORAL | Status: DC
  Administered 2021-11-20 – 2021-11-26 (×10): 1 via ORAL
  Filled 2021-11-20 (×11): qty 1

## 2021-11-20 MED ORDER — MIDAZOLAM HCL 2 MG/2ML IJ SOLN: 2.0000 mg | Freq: Once | INTRAMUSCULAR | Status: AC

## 2021-11-20 MED ORDER — ASCORBIC ACID 500 MG PO TABS
1000.0000 mg | ORAL_TABLET | Freq: Every day | ORAL | Status: DC
  Administered 2021-11-20 – 2021-11-26 (×7): 1000 mg via ORAL
  Filled 2021-11-20 (×7): qty 2

## 2021-11-20 MED ORDER — PROPOFOL 500 MG/50ML IV EMUL
INTRAVENOUS | Status: DC | PRN
  Administered 2021-11-20: 25 ug/kg/min via INTRAVENOUS

## 2021-11-20 MED ORDER — PROMETHAZINE HCL 25 MG/ML IJ SOLN: 6.2500 mg | INTRAMUSCULAR | Status: DC | PRN

## 2021-11-20 MED ORDER — POLYETHYLENE GLYCOL 3350 17 G PO PACK: 17.0000 g | PACK | Freq: Every day | ORAL | Status: DC | PRN

## 2021-11-20 MED ORDER — LACTATED RINGERS IV SOLN: INTRAVENOUS | Status: DC

## 2021-11-20 MED ORDER — FENTANYL CITRATE (PF) 100 MCG/2ML IJ SOLN: 50.0000 ug | Freq: Once | INTRAMUSCULAR | Status: AC

## 2021-11-20 MED ORDER — HYDRALAZINE HCL 20 MG/ML IJ SOLN: 5.0000 mg | INTRAMUSCULAR | Status: DC | PRN

## 2021-11-20 MED ORDER — MIDAZOLAM HCL 2 MG/2ML IJ SOLN
INTRAMUSCULAR | Status: AC
  Filled 2021-11-20: qty 2

## 2021-11-20 MED ORDER — FENTANYL CITRATE (PF) 100 MCG/2ML IJ SOLN: 25.0000 ug | INTRAMUSCULAR | Status: DC | PRN

## 2021-11-20 MED ORDER — BUPIVACAINE-EPINEPHRINE (PF) 0.5% -1:200000 IJ SOLN
INTRAMUSCULAR | Status: DC | PRN
  Administered 2021-11-20: 15 mL
  Administered 2021-11-20: 25 mL

## 2021-11-20 MED ORDER — OXYCODONE HCL 5 MG PO TABS: 5.0000 mg | ORAL_TABLET | Freq: Once | ORAL | Status: DC | PRN

## 2021-11-20 MED ORDER — ZINC SULFATE 220 (50 ZN) MG PO CAPS
220.0000 mg | ORAL_CAPSULE | Freq: Every day | ORAL | Status: DC
  Administered 2021-11-20 – 2021-11-26 (×7): 220 mg via ORAL
  Filled 2021-11-20 (×7): qty 1

## 2021-11-20 SURGICAL SUPPLY — 36 items
APL SKNCLS STERI-STRIP NONHPOA (GAUZE/BANDAGES/DRESSINGS) ×1
BAG COUNTER SPONGE SURGICOUNT (BAG) ×2 IMPLANT
BAG SPNG CNTER NS LX DISP (BAG) ×1
BENZOIN TINCTURE PRP APPL 2/3 (GAUZE/BANDAGES/DRESSINGS) ×2 IMPLANT
BLADE SAW SGTL HD 18.5X60.5X1. (BLADE) ×2 IMPLANT
BLADE SURG 21 STRL SS (BLADE) ×2 IMPLANT
BNDG COHESIVE 4X5 TAN STRL (GAUZE/BANDAGES/DRESSINGS) IMPLANT
BNDG COHESIVE 6X5 TAN NS LF (GAUZE/BANDAGES/DRESSINGS) ×1 IMPLANT
BNDG GAUZE ELAST 4 BULKY (GAUZE/BANDAGES/DRESSINGS) IMPLANT
CANISTER PREVENA 45 (CANNISTER) ×1 IMPLANT
COVER SURGICAL LIGHT HANDLE (MISCELLANEOUS) ×2 IMPLANT
DRAPE INCISE IOBAN 66X45 STRL (DRAPES) ×2 IMPLANT
DRAPE U-SHAPE 47X51 STRL (DRAPES) ×2 IMPLANT
DRSG ADAPTIC 3X8 NADH LF (GAUZE/BANDAGES/DRESSINGS) IMPLANT
DRSG PAD ABDOMINAL 8X10 ST (GAUZE/BANDAGES/DRESSINGS) IMPLANT
DURAPREP 26ML APPLICATOR (WOUND CARE) ×2 IMPLANT
ELECT REM PT RETURN 9FT ADLT (ELECTROSURGICAL) ×2
ELECTRODE REM PT RTRN 9FT ADLT (ELECTROSURGICAL) ×1 IMPLANT
GAUZE SPONGE 4X4 12PLY STRL (GAUZE/BANDAGES/DRESSINGS) IMPLANT
GLOVE SURG ORTHO LTX SZ9 (GLOVE) ×2 IMPLANT
GLOVE SURG UNDER POLY LF SZ9 (GLOVE) ×2 IMPLANT
GOWN STRL REUS W/ TWL XL LVL3 (GOWN DISPOSABLE) ×3 IMPLANT
GOWN STRL REUS W/TWL XL LVL3 (GOWN DISPOSABLE) ×6
KIT BASIN OR (CUSTOM PROCEDURE TRAY) ×2 IMPLANT
KIT PUMP PREVENA PLUS 14DAY (MISCELLANEOUS) ×1 IMPLANT
KIT TURNOVER KIT B (KITS) ×2 IMPLANT
NS IRRIG 1000ML POUR BTL (IV SOLUTION) ×2 IMPLANT
PACK ORTHO EXTREMITY (CUSTOM PROCEDURE TRAY) ×2 IMPLANT
PAD ARMBOARD 7.5X6 YLW CONV (MISCELLANEOUS) ×4 IMPLANT
SPONGE T-LAP 18X18 ~~LOC~~+RFID (SPONGE) IMPLANT
SUT ETHILON 2 0 PSLX (SUTURE) ×4 IMPLANT
TOWEL GREEN STERILE (TOWEL DISPOSABLE) ×2 IMPLANT
TOWEL GREEN STERILE FF (TOWEL DISPOSABLE) ×2 IMPLANT
TUBE CONNECTING 12X1/4 (SUCTIONS) ×2 IMPLANT
WATER STERILE IRR 1000ML POUR (IV SOLUTION) ×2 IMPLANT
YANKAUER SUCT BULB TIP NO VENT (SUCTIONS) ×2 IMPLANT

## 2021-11-20 NOTE — Transfer of Care (Signed)
Immediate Anesthesia Transfer of Care Note  Patient: Joseph [name]  Procedure(s) Performed: LEFT TRANSMETATARSAL AMPUTATION (Left: Foot)  Patient Location: PACU  Anesthesia Type:MAC and Regional  Level of Consciousness: awake, alert  and oriented  Airway & Oxygen Therapy: Patient Spontanous Breathing  Post-op Assessment: Report given to RN and Post -op Vital signs reviewed and stable  Post vital signs: Reviewed and stable  Last Vitals:  Vitals Value Taken Time  BP 121/71 11/20/21 1124  Temp    Pulse 81 11/20/21 1127  Resp 16 11/20/21 1127  SpO2 96 % 11/20/21 1127  Vitals shown include unvalidated device data.  Last Pain:  Vitals:   11/20/21 1001  TempSrc:   PainSc: 8          Complications: No notable events documented.

## 2021-11-20 NOTE — Interval H&P Note (Signed)
History and Physical Interval Note:  11/20/2021 6:50 AM  Joseph [name]  has presented today for surgery, with the diagnosis of Osteomyelitis Left Foot.  The various methods of treatment have been discussed with the patient and family. After consideration of risks, benefits and other options for treatment, the patient has consented to  Procedure(s): LEFT TRANSMETATARSAL AMPUTATION (Left) as a surgical intervention.  The patient's history has been reviewed, patient examined, no change in status, stable for surgery.  I have reviewed the patient's chart and labs.  Questions were answered to the patient's satisfaction.     [name]

## 2021-11-20 NOTE — Progress Notes (Signed)
Inpatient Rehab Admissions Coordinator:  Consult received. Await therapy assessments to help determine appropriateness of CIR. Given payor trends, insurance authorization may not be granted. Will continue to follow.   Wolfgang Phoenix, MS, CCC-SLP Admissions Coordinator [phone]

## 2021-11-20 NOTE — Anesthesia Postprocedure Evaluation (Signed)
Anesthesia Post Note  Patient: Joseph [name]  Procedure(s) Performed: LEFT TRANSMETATARSAL AMPUTATION (Left: Foot)     Patient location during evaluation: PACU Anesthesia Type: Regional Level of consciousness: awake and alert Pain management: pain level controlled Vital Signs Assessment: post-procedure vital signs reviewed and stable Respiratory status: spontaneous breathing, nonlabored ventilation and respiratory function stable Cardiovascular status: stable and blood pressure returned to baseline Anesthetic complications: no   No notable events documented.  Last Vitals:  Vitals:   11/20/21 1139 11/20/21 1217  BP: 114/66 122/78  Pulse: 81 80  Resp: 17 17  Temp: 37 C (!) 36.4 C  SpO2: 94% 99%    Last Pain:  Vitals:   11/20/21 1217  TempSrc: Oral  PainSc:                  [name]

## 2021-11-20 NOTE — Progress Notes (Signed)
Inpatient Diabetes Program Recommendations  AACE/ADA: New Consensus Statement on Inpatient Glycemic Control (2015)  Target Ranges:  Prepandial:   less than 140 mg/dL      Peak postprandial:   less than 180 mg/dL (1-2 hours)      Critically ill patients:  140 - 180 mg/dL   Lab Results  Component Value Date   GLUCAP 182 (H) 11/20/2021   HGBA1C 10.0 (H) 11/18/2021    Review of Glycemic Control  Latest Reference Range & Units 11/19/21 17:02 11/19/21 20:54 11/20/21 06:34 11/20/21 09:16 11/20/21 09:41 11/20/21 11:28 11/20/21 11:59  Glucose-Capillary 70 - 99 mg/dL 754 (H) 360 (H) 677 (H) 276 (H) 212 (H) 218 (H) 182 (H)   Home DM Meds: Novolog SSI                              Lantus 60 units BID                              Victoza 1.8 mg daily                              Metformin 1000 mg BID   Current Orders: Novolog resistant tid with meals                             Semglee 45 units bid    Inpatient Diabetes Program Recommendations:    Agree with current orders.  Referral received.  Patient was seen by Diabetes Coordinator on 11/18/21.    Thanks,  Beryl Meager, RN, BC-ADM Inpatient Diabetes Coordinator Pager [phone]  (8a-5p)

## 2021-11-20 NOTE — Anesthesia Procedure Notes (Signed)
Anesthesia Regional Block: Popliteal block   Pre-Anesthetic Checklist: , timeout performed,  Correct Patient, Correct Site, Correct Laterality,  Correct Procedure, Correct Position, site marked,  Risks and benefits discussed,  Surgical consent,  Pre-op evaluation,  At surgeon's request and post-op pain management  Laterality: Left  Prep: chloraprep       Needles:  Injection technique: Single-shot  Needle Type: Echogenic Needle     Needle Length: 10cm  Needle Gauge: 21     Additional Needles:   Narrative:  Start time: 11/20/2021 10:15 AM End time: 11/20/2021 10:18 AM Injection made incrementally with aspirations every 5 mL.  Performed by: Personally  Anesthesiologist: [name], MD  Additional Notes: No pain on injection. No increased resistance to injection. Injection made in 5cc increments. Good needle visualization. Patient tolerated the procedure well.
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
## 2021-11-20 NOTE — Anesthesia Preprocedure Evaluation (Addendum)
Anesthesia Evaluation  Patient identified by MRN, date of birth, ID band Patient awake    Reviewed: Allergy & Precautions, NPO status , Patient's Chart, lab work & pertinent test results  History of Anesthesia Complications Negative for: history of anesthetic complications  Airway Mallampati: II  TM Distance: >3 FB Neck ROM: Full    Dental  (+) Dental Advisory Given   Pulmonary asthma , sleep apnea , COPD, former smoker, PE   Pulmonary exam normal        Cardiovascular hypertension, Pt. on medications and Pt. on home beta blockers + angina + CAD, + Past MI, + Cardiac Stents, + CABG, + Peripheral Vascular Disease and + DVT  Normal cardiovascular exam+ Valvular Problems/Murmurs    '22 Cath - 1.Severe native coronary artery disease, as detailed below, including functional occlusions of the mid LAD and mid LCx with competitive flow from bypass grafts, 70% distal LMCA stenosis, and multifocal RCA disease of up to 70%. 2.Widely patent LIMA-LAD, SVG-D3, and SVG-OM2. 3.Patent SVG-RPDA with 30% proximal/mid graft stenosis. 4.Moderately elevated left heart filling pressure with significant respiratory variation (LVEDP 30 mmHg end-expiratory, 20-59mmHg mean).  '22 TTE - EF 55%. Mild concentric left ventricular hypertrophy. Grade I diastolic dysfunction (impaired relaxation).There is mild dilatation of the aortic root, measuring 42 mm. There is mild dilatation of the ascending aorta, measuring 43 mm.     Neuro/Psych PSYCHIATRIC DISORDERS Anxiety Depression negative neurological ROS     GI/Hepatic Neg liver ROS, GERD  Medicated,  Endo/Other  diabetes, Type 2, Insulin Dependent, Oral Hypoglycemic AgentsMorbid obesity K 5.4   Renal/GU CRFRenal disease     Musculoskeletal  (+) Arthritis ,   Abdominal (+) + obese,   Peds  Hematology  On plavix    Anesthesia Other Findings   Reproductive/Obstetrics                            Anesthesia Physical Anesthesia Plan  ASA: 3  Anesthesia Plan: Regional   Post-op Pain Management: Regional block and Tylenol PO (pre-op)   Induction: Intravenous  PONV Risk Score and Plan: 1 and Treatment may vary due to age or medical condition, Propofol infusion and Midazolam  Airway Management Planned: LMA  Additional Equipment: None  Intra-op Plan:   Post-operative Plan:   Informed Consent: I have reviewed the patients History and Physical, chart, labs and discussed the procedure including the risks, benefits and alternatives for the proposed anesthesia with the patient or authorized representative who has indicated his/her understanding and acceptance.       Plan Discussed with: CRNA, Anesthesiologist and Surgeon  Anesthesia Plan Comments:        Anesthesia Quick Evaluation

## 2021-11-20 NOTE — Op Note (Signed)
11/20/2021  11:39 AM  PATIENT:  Joseph [name]    PRE-OPERATIVE DIAGNOSIS:  Osteomyelitis Left Foot  POST-OPERATIVE DIAGNOSIS:  Same  PROCEDURE:  LEFT TRANSMETATARSAL AMPUTATION  SURGEON:  [name], MD  PHYSICIAN ASSISTANT:None ANESTHESIA:   General  PREOPERATIVE INDICATIONS:  Kha Hari Pressnell is a  50 y.o. male with a diagnosis of Osteomyelitis Left Foot who failed conservative measures and elected for surgical management.    The risks benefits and alternatives were discussed with the patient preoperatively including but not limited to the risks of infection, bleeding, nerve injury, cardiopulmonary complications, the need for revision surgery, among others, and the patient was willing to proceed.  OPERATIVE IMPLANTS: None  @ENCIMAGES @  OPERATIVE FINDINGS: Wound margins clear with petechial bleeding  OPERATIVE PROCEDURE: Patient was brought the operating room after undergoing a regional anesthetic.  After adequate levels anesthesia were obtained patient's left lower extremity was prepped using DuraPrep draped into a sterile field a timeout was called.  A fishmouth incision was made just proximal to the necrotic ulcers both dorsal and plantar.  A oscillating saw was used to perform a transmetatarsal amputation.  Electrocardio used for hemostasis the wound was irrigated normal saline incision was closed using 2-0 nylon.  A 13 cm Prevena wound VAC was applied this was overwrapped with derma tack Ioban and Covan.  This had a good suction fit patient was taken the PACU in stable condition.   DISCHARGE PLANNING:  Antibiotic duration: Patient currently on IV antibiotics continue for 24 hours  Weightbearing: Touchdown weightbearing on the left  Pain medication: Continue opioid pathway  Dressing care/ Wound VAC: Continue wound VAC for 1 week  Ambulatory devices: Walker or kneeling scooter  Discharge to: Anticipate discharge to home  Follow-up: In the office 1 week post  operative.

## 2021-11-20 NOTE — Anesthesia Procedure Notes (Signed)
Anesthesia Regional Block: Adductor canal block   Pre-Anesthetic Checklist: , timeout performed,  Correct Patient, Correct Site, Correct Laterality,  Correct Procedure, Correct Position, site marked,  Risks and benefits discussed,  Surgical consent,  Pre-op evaluation,  At surgeon's request and post-op pain management  Laterality: Left  Prep: chloraprep       Needles:  Injection technique: Single-shot  Needle Type: Echogenic Needle     Needle Length: 10cm  Needle Gauge: 21     Additional Needles:   Narrative:  Start time: 11/20/2021 10:11 AM End time: 11/20/2021 10:14 AM Injection made incrementally with aspirations every 5 mL.  Performed by: Personally  Anesthesiologist: [name], MD  Additional Notes: No pain on injection. No increased resistance to injection. Injection made in 5cc increments. Good needle visualization. Patient tolerated the procedure well.

## 2021-11-20 NOTE — TOC Initial Note (Addendum)
Transition of Care [facility]) - Initial/Assessment Note    Patient Details  Name: Joseph [name] MRN: [id number] Date of Birth: 06-16-1971  Transition of Care [facility]) CM/SW Contact:    Tom-Johnson, Hershal Coria, RN Phone Number: 11/20/2021, 5:09 PM  Clinical Narrative:                 CM spoke with patient at bedside about needs for post hospital transition. Patient presented to the ED with let foot Cellulitis. Ortho consulted and had Left transmetatarsal amputation done today with Prevena wound vac. CIR consulted, awaiting PT eval for CIR appropriateness. Patient states he lives at home with wife son, his only child. Patient is currently on disability. Has a cane, walker and shower seat at home. PCP is Marva Panda, MD and uses [facility] pharmacy on [address]. Medical workup continues. CM will continue to follow with needs.     Barriers to Discharge: Continued Medical Work up   Patient Goals and CMS Choice Patient states their goals for this hospitalization and ongoing recovery are:: To go home CMS Medicare.gov Compare Post Acute Care list provided to:: Patient    Expected Discharge Plan and Services     Discharge Planning Services: CM Consult   Living arrangements for the past 2 months: Single Family Home                                      Prior Living Arrangements/Services Living arrangements for the past 2 months: Single Family Home Lives with:: Spouse, Adult Children Patient language and need for interpreter reviewed:: Yes Do you feel safe going back to the place where you live?: Yes      Need for Family Participation in Patient Care: Yes (Comment) Care giver support system in place?: Yes (comment) Current home services: DME (Walker, cane, shower seat.) Criminal Activity/Legal Involvement Pertinent to Current Situation/Hospitalization: No - Comment as needed  Activities of Daily Living      Permission Sought/Granted Permission sought to share  information with : Case Manager, Family Supports Permission granted to share information with : Yes, Verbal Permission Granted              Emotional Assessment Appearance:: Appears stated age Attitude/Demeanor/Rapport: Engaged Affect (typically observed): Accepting, Appropriate, Calm, Hopeful Orientation: : Oriented to Self, Oriented to Place, Oriented to  Time, Oriented to Situation Alcohol / Substance Use: Not Applicable Psych Involvement: No (comment)  Admission diagnosis:  Wound infection [T14.8XXA, L08.9] Sepsis (HCC) [A41.9] Sepsis, due to unspecified organism, unspecified whether acute organ dysfunction present [facility]) [A41.9] Patient Active Problem List   Diagnosis Date Noted   Severe sepsis (HCC) 11/18/2021   Chest pain 11/18/2021   Dyspnea 11/18/2021   Wound infection    Ulcer of left foot with necrosis of bone (HCC)    Bicuspid aortic valve 09/14/2021   Aneurysm of ascending aorta 09/14/2021   Hyperkalemia 08/20/2021   DM (diabetes mellitus) with peripheral vascular complication (HCC) 08/20/2021   Atypical chest pain 07/17/2021   Hyponatremia 07/17/2021   Leg wound, left 07/17/2021   Essential hypertension 07/17/2021   Acute hyperglycemia 07/09/2021   Left leg cellulitis 04/11/2021   Cellulitis of left leg 03/05/2021   Cellulitis 02/18/2021   PAD (peripheral artery disease) (HCC) 10/10/2020   Osteomyelitis of fourth toe of right foot (HCC)    S/P CABG x 4 07/24/2020   Non-ST elevation (NSTEMI) myocardial infarction (HCC) 07/22/2020  Acute on chronic diastolic heart failure (HCC) 07/22/2020   Hypotension 07/18/2020   Left leg swelling 07/18/2020   Elevated troponin 07/18/2020   AKI (acute kidney injury) (HCC) 07/18/2020   Elevated brain natriuretic peptide (BNP) level 07/18/2020   GERD (gastroesophageal reflux disease) 07/18/2020   Unstable angina (HCC) 11/01/2019   Edema 06/13/2019   Leukocytosis 05/31/2019   Smoker 03/08/2019   Chronic pain 03/08/2019    Chronic back pain 02/16/2019   Deep venous thrombosis (HCC) 02/16/2019   Obesity, Class III, BMI 40-49.9 (morbid obesity) (HCC) 02/16/2019   Peripheral nerve disease 02/16/2019   COPD (chronic obstructive pulmonary disease) (HCC) 01/22/2019   Anxiety disorder 03/26/2018   Open wound of toe 10/12/2017   Sinusitis 10/12/2017   Abdominal pain    Loss of weight    Diabetic foot infection (HCC) 09/16/2017   Uncontrolled type 2 diabetes mellitus with hyperglycemia, with long-term current use of insulin (HCC) 09/16/2017   Cellulitis of right foot    Chronic ulcer of great toe of right foot (HCC) 09/09/2017   Recurrent chest pain 07/29/2016   Hyperglycemia    Insulin dependent type 2 diabetes mellitus (HCC) 04/29/2015   OSA (obstructive sleep apnea) 05/08/2013   History of pulmonary embolus (PE) 04/27/2013   CAD -S/P PCI 11/02/19  09/10/2008   Hyperlipidemia 09/09/2008   PCP:  Marva Panda, NP Pharmacy:   [facility] [phone] - [place], [place] - [address] AT [facility] OF [address] [place] [address] Phone: [phone] Fax: [phone]  [id number] - [place], [place] - [address] AT SEC OF [address] [name] [address] Phone: [phone] Fax: [phone]  [facility] [address] [name] [address] Phone: [phone] Fax: [phone]     Social Determinants of Health (SDOH) Interventions    Readmission Risk Interventions Readmission Risk Prevention Plan 08/20/2021 07/12/2021  Transportation Screening Complete Complete  Medication Review Oceanographer) Complete Complete  PCP or Specialist appointment within 3-5 days of discharge Complete Complete  HRI or Home Care Consult Complete Complete  SW Recovery Care/Counseling Consult Complete Complete  Palliative Care Screening Not Applicable Not Applicable  Skilled  Nursing Facility Not Applicable Not Applicable  Some recent data might be hidden

## 2021-11-21 ENCOUNTER — Encounter (HOSPITAL_COMMUNITY): Payer: Self-pay | Admitting: Orthopedic Surgery

## 2021-11-21 DIAGNOSIS — M869 Osteomyelitis, unspecified: Secondary | ICD-10-CM | POA: Diagnosis not present

## 2021-11-21 DIAGNOSIS — L03116 Cellulitis of left lower limb: Secondary | ICD-10-CM | POA: Diagnosis not present

## 2021-11-21 LAB — CBC
HCT: 31.6 % — ABNORMAL LOW (ref 39.0–52.0)
Hemoglobin: 9.9 g/dL — ABNORMAL LOW (ref 13.0–17.0)
MCH: 25.3 pg — ABNORMAL LOW (ref 26.0–34.0)
MCHC: 31.3 g/dL (ref 30.0–36.0)
MCV: 80.8 fL (ref 80.0–100.0)
Platelets: 281 10*3/uL (ref 150–400)
RBC: 3.91 MIL/uL — ABNORMAL LOW (ref 4.22–5.81)
RDW: 14.3 % (ref 11.5–15.5)
WBC: 10.4 10*3/uL (ref 4.0–10.5)
nRBC: 0 % (ref 0.0–0.2)

## 2021-11-21 LAB — BASIC METABOLIC PANEL
Anion gap: 9 (ref 5–15)
BUN: 22 mg/dL — ABNORMAL HIGH (ref 6–20)
CO2: 23 mmol/L (ref 22–32)
Calcium: 8.2 mg/dL — ABNORMAL LOW (ref 8.9–10.3)
Chloride: 102 mmol/L (ref 98–111)
Creatinine, Ser: 1.46 mg/dL — ABNORMAL HIGH (ref 0.61–1.24)
GFR, Estimated: 59 mL/min — ABNORMAL LOW (ref >60)
Glucose, Bld: 330 mg/dL — ABNORMAL HIGH (ref 70–99)
Potassium: 4.8 mmol/L (ref 3.5–5.1)
Sodium: 134 mmol/L — ABNORMAL LOW (ref 135–145)

## 2021-11-21 LAB — GLUCOSE, CAPILLARY
Glucose-Capillary: 250 mg/dL — ABNORMAL HIGH (ref 70–99)
Glucose-Capillary: 276 mg/dL — ABNORMAL HIGH (ref 70–99)
Glucose-Capillary: 314 mg/dL — ABNORMAL HIGH (ref 70–99)
Glucose-Capillary: 228 mg/dL — ABNORMAL HIGH (ref 70–99)
Glucose-Capillary: 242 mg/dL — ABNORMAL HIGH (ref 70–99)

## 2021-11-21 MED ORDER — INSULIN ASPART 100 UNIT/ML IJ SOLN
3.0000 [IU] | Freq: Three times a day (TID) | INTRAMUSCULAR | Status: DC
  Administered 2021-11-21 – 2021-11-23 (×7): 3 [IU] via SUBCUTANEOUS

## 2021-11-21 MED ORDER — FUROSEMIDE 10 MG/ML IJ SOLN
20.0000 mg | Freq: Once | INTRAMUSCULAR | Status: AC
  Administered 2021-11-21: 20 mg via INTRAVENOUS
  Filled 2021-11-21: qty 2

## 2021-11-21 MED ORDER — LACTATED RINGERS IV BOLUS: 1000.0000 mL | Freq: Once | INTRAVENOUS | Status: DC

## 2021-11-21 MED ORDER — INSULIN GLARGINE-YFGN 100 UNIT/ML ~~LOC~~ SOLN
55.0000 [IU] | Freq: Two times a day (BID) | SUBCUTANEOUS | Status: DC
  Administered 2021-11-21 – 2021-11-22 (×3): 55 [IU] via SUBCUTANEOUS
  Filled 2021-11-21 (×4): qty 0.55

## 2021-11-21 MED ORDER — METRONIDAZOLE 500 MG/100ML IV SOLN
500.0000 mg | Freq: Once | INTRAVENOUS | Status: AC
  Administered 2021-11-21: 500 mg via INTRAVENOUS

## 2021-11-21 NOTE — Progress Notes (Addendum)
Patient sleepy and apneic most of the day after taking pain medication. Patient stated he wears CPAP at home and new orders for CPAP written.  Arousal on call and ate all meals. Patient continues to ask for pain medication and this RN stated to patient that pain medication will be on hold for now until he is fully awake due to him being apneic and sleepy most of the day.  Patient stated understanding.

## 2021-11-21 NOTE — Progress Notes (Signed)
PIV in LAFA working well with no resistance noted on IV pump.  RH PIV not present.  Recommended to RN to remove LAC PIV and utilize LAFA.  Per current meds only IV F ordered at this time, no routine IV meds. No need to restart PIV at this time.
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
## 2021-11-21 NOTE — Progress Notes (Signed)
Inpatient Rehab Admissions:  Inpatient Rehab Consult received.  I met with patient at the bedside for rehabilitation assessment and to discuss goals and expectations of an inpatient rehab admission.  Pt was lethargic. Left pamphlet about CIR with pt. Pt would like some time to think about CIR. Will f/u at later date.     Signed: Gayland Curry, [place], [place] Admissions Coordinator [phone]
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
## 2021-11-21 NOTE — Progress Notes (Signed)
Patient ID: Joseph [name], male   DOB: 1971/02/26, 50 y.o.   MRN: [id number] Patient is postop day 1 transmetatarsal amputation.  There is minimal drainage in the wound VAC canister.  I discussed with pharmacy they may discontinue antibiotics today.  Patient may discharge to home in a day or 2 once he is safe with therapy.  I will follow-up in the office this next week.  Continue wound VAC.
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
## 2021-11-21 NOTE — Progress Notes (Signed)
Orthopedic Tech Progress Note Patient Details:  Joseph [name] 28-Jul-1971 [id number]  Ortho Devices Type of Ortho Device: CAM walker Ortho Device/Splint Location: Left Foot Ortho Device/Splint Interventions: Application   Post Interventions Patient Tolerated: Well  Joseph [name] 11/21/2021, 11:20 AM

## 2021-11-22 DIAGNOSIS — M869 Osteomyelitis, unspecified: Secondary | ICD-10-CM | POA: Diagnosis not present

## 2021-11-22 DIAGNOSIS — L97524 Non-pressure chronic ulcer of other part of left foot with necrosis of bone: Secondary | ICD-10-CM | POA: Diagnosis not present

## 2021-11-22 DIAGNOSIS — L03116 Cellulitis of left lower limb: Secondary | ICD-10-CM | POA: Diagnosis not present

## 2021-11-22 LAB — CULTURE, BLOOD (ROUTINE X 2)
Culture: NO GROWTH
Culture: NO GROWTH
Special Requests: ADEQUATE

## 2021-11-22 LAB — GLUCOSE, CAPILLARY
Glucose-Capillary: 242 mg/dL — ABNORMAL HIGH (ref 70–99)
Glucose-Capillary: 219 mg/dL — ABNORMAL HIGH (ref 70–99)
Glucose-Capillary: 221 mg/dL — ABNORMAL HIGH (ref 70–99)
Glucose-Capillary: 246 mg/dL — ABNORMAL HIGH (ref 70–99)

## 2021-11-22 MED ORDER — ALPRAZOLAM 0.5 MG PO TABS: 1.0000 mg | ORAL_TABLET | Freq: Every day | ORAL | Status: DC

## 2021-11-22 MED ORDER — OXYCODONE HCL 5 MG PO TABS
10.0000 mg | ORAL_TABLET | Freq: Four times a day (QID) | ORAL | Status: DC | PRN
  Administered 2021-11-22 – 2021-11-23 (×2): 10 mg via ORAL
  Filled 2021-11-22 (×2): qty 2

## 2021-11-22 MED ORDER — INSULIN GLARGINE-YFGN 100 UNIT/ML ~~LOC~~ SOLN
65.0000 [IU] | Freq: Two times a day (BID) | SUBCUTANEOUS | Status: DC
  Administered 2021-11-22: 65 [IU] via SUBCUTANEOUS
  Filled 2021-11-22 (×3): qty 0.65
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
## 2021-11-22 NOTE — Progress Notes (Signed)
Inpatient Rehab Admissions Coordinator:  Attempted to follow up with pt to see if he made a decision regarding pursuing CIR. Pt continues to be lethargic. Will f/u at later time.  Wolfgang Phoenix, MS, CCC-SLP Admissions Coordinator [phone]
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
## 2021-11-23 DIAGNOSIS — M86172 Other acute osteomyelitis, left ankle and foot: Secondary | ICD-10-CM

## 2021-11-23 DIAGNOSIS — L03116 Cellulitis of left lower limb: Secondary | ICD-10-CM | POA: Diagnosis not present

## 2021-11-23 LAB — BLOOD GAS, ARTERIAL
Acid-base deficit: 2 mmol/L (ref 0.0–2.0)
Bicarbonate: 22 mmol/L (ref 20.0–28.0)
Drawn by: 275531
FIO2: 21
O2 Saturation: 95.7 %
Patient temperature: 37
pCO2 arterial: 36.2 mmHg (ref 32.0–48.0)
pH, Arterial: 7.402 (ref 7.350–7.450)
pO2, Arterial: 78.3 mmHg — ABNORMAL LOW (ref 83.0–108.0)

## 2021-11-23 LAB — GLUCOSE, CAPILLARY
Glucose-Capillary: 303 mg/dL — ABNORMAL HIGH (ref 70–99)
Glucose-Capillary: 225 mg/dL — ABNORMAL HIGH (ref 70–99)
Glucose-Capillary: 198 mg/dL — ABNORMAL HIGH (ref 70–99)
Glucose-Capillary: 197 mg/dL — ABNORMAL HIGH (ref 70–99)

## 2021-11-23 LAB — BASIC METABOLIC PANEL
Anion gap: 5 (ref 5–15)
BUN: 27 mg/dL — ABNORMAL HIGH (ref 6–20)
CO2: 26 mmol/L (ref 22–32)
Calcium: 8.7 mg/dL — ABNORMAL LOW (ref 8.9–10.3)
Chloride: 102 mmol/L (ref 98–111)
Creatinine, Ser: 1.41 mg/dL — ABNORMAL HIGH (ref 0.61–1.24)
GFR, Estimated: >60 mL/min (ref >60)
Glucose, Bld: 296 mg/dL — ABNORMAL HIGH (ref 70–99)
Potassium: 4.6 mmol/L (ref 3.5–5.1)
Sodium: 133 mmol/L — ABNORMAL LOW (ref 135–145)

## 2021-11-23 LAB — CBC
HCT: 31.9 % — ABNORMAL LOW (ref 39.0–52.0)
Hemoglobin: 10 g/dL — ABNORMAL LOW (ref 13.0–17.0)
MCH: 25.4 pg — ABNORMAL LOW (ref 26.0–34.0)
MCHC: 31.3 g/dL (ref 30.0–36.0)
MCV: 81.2 fL (ref 80.0–100.0)
Platelets: 387 10*3/uL (ref 150–400)
RBC: 3.93 MIL/uL — ABNORMAL LOW (ref 4.22–5.81)
RDW: 14.3 % (ref 11.5–15.5)
WBC: 10.5 10*3/uL (ref 4.0–10.5)
nRBC: 0 % (ref 0.0–0.2)

## 2021-11-23 LAB — SURGICAL PATHOLOGY

## 2021-11-23 MED ORDER — INSULIN ASPART 100 UNIT/ML IJ SOLN
8.0000 [IU] | Freq: Three times a day (TID) | INTRAMUSCULAR | Status: DC
  Administered 2021-11-23 – 2021-11-26 (×7): 8 [IU] via SUBCUTANEOUS

## 2021-11-23 MED ORDER — ALPRAZOLAM 0.5 MG PO TABS
0.5000 mg | ORAL_TABLET | Freq: Every day | ORAL | Status: DC
  Administered 2021-11-23 – 2021-11-25 (×3): 0.5 mg via ORAL
  Filled 2021-11-23 (×3): qty 1

## 2021-11-23 MED ORDER — OXYCODONE HCL 5 MG PO TABS
5.0000 mg | ORAL_TABLET | Freq: Four times a day (QID) | ORAL | Status: DC | PRN
  Administered 2021-11-23: 5 mg via ORAL
  Filled 2021-11-23 (×2): qty 1

## 2021-11-23 MED ORDER — INSULIN GLARGINE-YFGN 100 UNIT/ML ~~LOC~~ SOLN
70.0000 [IU] | Freq: Two times a day (BID) | SUBCUTANEOUS | Status: DC
  Administered 2021-11-23 – 2021-11-24 (×3): 70 [IU] via SUBCUTANEOUS
  Filled 2021-11-23 (×4): qty 0.7

## 2021-11-23 MED ORDER — OXYCODONE HCL 5 MG PO TABS
7.5000 mg | ORAL_TABLET | Freq: Four times a day (QID) | ORAL | Status: DC | PRN
  Administered 2021-11-23 – 2021-11-26 (×10): 7.5 mg via ORAL
  Filled 2021-11-23 (×10): qty 2

## 2021-11-23 NOTE — Consult Note (Addendum)
WOC follow-up: Pt went to the OR with Dr [name] of the ortho service on 12/2 for left leg. Profore dressings were applied on 12/1 and are intact to RLE; WOC team will plan to change compression wraps on Thurs 12/8 if patient is still in the hospital at that time. Thank-you,  Cammie Mcgee MSN, RN, CWOCN, [place], CNS [phone]
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
## 2021-11-23 NOTE — Care Management Important Message (Signed)
Important Message  Patient Details  Name: Joseph [name] MRN: [id number] Date of Birth: Aug 16, 1971   Medicare Important Message Given:  Yes     Dorena Bodo 11/23/2021, 4:25 PM

## 2021-11-23 NOTE — Progress Notes (Signed)
Patient ID: Joseph [name], male   DOB: Dec 21, 1970, 50 y.o.   MRN: [id number] Patient is a 50 year old gentleman status post transmetatarsal amputation.  Patient is 3 days postop.  Patient is in a fracture boot he states he is having difficulty maintaining nonweightbearing.  Anticipate discharge to home when patient is safe with therapy.  He will follow-up in the office at the end of this week.

## 2021-11-23 NOTE — Progress Notes (Addendum)
Inpatient Rehab Admissions:  Inpatient Rehab Consult received.  I met with patient at the bedside for rehabilitation assessment and to discuss goals and expectations of an inpatient rehab admission.  Pt acknowledged understanding of CIR goals and expectations. Pt interested in pursuing CIR. Discussed that insurance may not approve a CIR stay; pt would still like to pursue CIR.  Pt gave permission to contact wife, Nira Conn. Attempted to call wife. Will continue to follow.  ADDENDUM: Spoke with pt's wife, Nira Conn. She acknowledged understanding of CIR goals and expectations. She is supportive of pt pursuing CIR. She confirmed 24/7 support for pt after discharge.  Insurance authorization started.   ADDENDUM: Received an insurance denial. Dr. Waldron Labs made aware.   Signed: Gayland Curry, [place], [place] Admissions Coordinator [phone]

## 2021-11-23 NOTE — Progress Notes (Signed)
Inpatient Diabetes Program Recommendations  AACE/ADA: New Consensus Statement on Inpatient Glycemic Control (2015)  Target Ranges:  Prepandial:   less than 140 mg/dL      Peak postprandial:   less than 180 mg/dL (1-2 hours)      Critically ill patients:  140 - 180 mg/dL   Lab Results  Component Value Date   GLUCAP 303 (H) 11/23/2021   HGBA1C 10.0 (H) 11/18/2021    Review of Glycemic Control  Latest Reference Range & Units 11/21/21 17:03 11/21/21 21:12 11/22/21 06:39 11/22/21 11:25 11/22/21 16:23 11/22/21 21:54 11/23/21 06:37  Glucose-Capillary 70 - 99 mg/dL 902 (H) 409 (H) 735 (H) 219 (H) 221 (H) 246 (H) 303 (H)   Diabetes history: DM 2 Home DM Meds: Novolog SSI                              Lantus 60 units BID                              Victoza 1.8 mg daily                              Metformin 1000 mg BID   Current Orders: Novolog resistant tid with meals and HS                            Semglee 70 units bid       Novolog 3 units tid with meals Inpatient Diabetes Program Recommendations:    Consider increasing Novolog meal coverage to 8 units tid with meals (hold if patient eats less than 50% or NPO).   Thanks,  Beryl Meager, RN, BC-ADM Inpatient Diabetes Coordinator Pager [phone]  (8a-5p)
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
## 2021-11-24 DIAGNOSIS — L03116 Cellulitis of left lower limb: Secondary | ICD-10-CM | POA: Diagnosis not present

## 2021-11-24 DIAGNOSIS — A419 Sepsis, unspecified organism: Secondary | ICD-10-CM | POA: Diagnosis not present

## 2021-11-24 DIAGNOSIS — L97524 Non-pressure chronic ulcer of other part of left foot with necrosis of bone: Secondary | ICD-10-CM | POA: Diagnosis not present

## 2021-11-24 DIAGNOSIS — F319 Bipolar disorder, unspecified: Secondary | ICD-10-CM

## 2021-11-24 LAB — GLUCOSE, CAPILLARY
Glucose-Capillary: 131 mg/dL — ABNORMAL HIGH (ref 70–99)
Glucose-Capillary: 113 mg/dL — ABNORMAL HIGH (ref 70–99)
Glucose-Capillary: 96 mg/dL (ref 70–99)
Glucose-Capillary: 130 mg/dL — ABNORMAL HIGH (ref 70–99)

## 2021-11-24 MED ORDER — LAMOTRIGINE 25 MG PO TABS
25.0000 mg | ORAL_TABLET | Freq: Every day | ORAL | Status: DC
  Administered 2021-11-25 – 2021-11-26 (×2): 25 mg via ORAL
  Filled 2021-11-24 (×2): qty 1

## 2021-11-24 MED ORDER — INSULIN GLARGINE-YFGN 100 UNIT/ML ~~LOC~~ SOLN
65.0000 [IU] | Freq: Two times a day (BID) | SUBCUTANEOUS | Status: DC
  Administered 2021-11-24 – 2021-11-26 (×4): 65 [IU] via SUBCUTANEOUS
  Filled 2021-11-24 (×5): qty 0.65

## 2021-11-24 NOTE — Progress Notes (Signed)
PT Note  Pt was up earlier and at this time defers mobility due to wanting to rest. Inpatient rehab was denied by insurance and pt's wife does not feel she can assist him at home. Updated recommendation to SNF.   Georga Hacking [facility] PT Acute Rehabilitation Services Pager [phone] Office [phone]

## 2021-11-24 NOTE — TOC Progression Note (Signed)
Transition of Care [facility]) - Progression Note    Patient Details  Name: SWAN ZAYED MRN: [id number] Date of Birth: January 03, 1971  Transition of Care [facility]) CM/SW Contact  Mearl Latin, LCSW Phone Number: 11/24/2021, 4:07 PM  Clinical Narrative:    CSW spoke with patient's spouse. She is requesting [facility] for SNF. CSW sent out referral for review.    Expected Discharge Plan: Skilled Nursing Facility Barriers to Discharge: SNF Pending bed offer, Insurance Authorization (Bariatric bed)  Expected Discharge Plan and Services Expected Discharge Plan: Skilled Nursing Facility   Discharge Planning Services: CM Consult   Living arrangements for the past 2 months: Single Family Home                                       Social Determinants of Health (SDOH) Interventions    Readmission Risk Interventions Readmission Risk Prevention Plan 11/24/2021 08/20/2021 07/12/2021  Transportation Screening Complete Complete Complete  Medication Review Oceanographer) Complete Complete Complete  PCP or Specialist appointment within 3-5 days of discharge Complete Complete Complete  HRI or Home Care Consult Complete Complete Complete  SW Recovery Care/Counseling Consult Complete Complete Complete  Palliative Care Screening Not Applicable Not Applicable Not Applicable  Skilled Nursing Facility Complete Not Applicable Not Applicable  Some recent data might be hidden
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
## 2021-11-24 NOTE — Progress Notes (Signed)
Pt CPAP set up on CPAP of 10. Pt states he will put it back on later tonight after a phone call. RT will cont to monitor.

## 2021-11-24 NOTE — Progress Notes (Signed)
Inpatient Rehab Admissions Coordinator:   Following for my colleague, Wolfgang Phoenix.  Note that insurance has denied CIR.  Peer to peer is complete and denial is upheld.  Dr. Randol Kern has spoken to pt/family and they are okay with d/c to SNF for rehab.  I will sign off at this time.   Estill Dooms, PT, DPT Admissions Coordinator [phone] 11/24/21  10:06 AM

## 2021-11-24 NOTE — [place] FL2 (Signed)
[place] MEDICAID FL2 LEVEL OF CARE SCREENING TOOL     IDENTIFICATION  Patient Name: Joseph [name] Birthdate: [birth year] Sex: male Admission Date (Current Location): 11/17/2021  [facility] and [place] Number:  Recruitment consultant and Address:  The [place], [address] [place], [name] [address]      Provider Number: [id number]  Attending Physician Name and Address:  Elgergawy, Leana Roe, MD  Relative Name and Phone Number:       Current Level of Care: Hospital Recommended Level of Care: Skilled Nursing Facility Prior Approval Number:    Date Approved/Denied:   PASRR Number: [id number]  Discharge Plan: SNF    Current Diagnoses: Patient Active Problem List   Diagnosis Date Noted   Severe sepsis (HCC) 11/18/2021   Chest pain 11/18/2021   Dyspnea 11/18/2021   Wound infection    Ulcer of left foot with necrosis of bone (HCC)    Bicuspid aortic valve 09/14/2021   Aneurysm of ascending aorta 09/14/2021   Hyperkalemia 08/20/2021   DM (diabetes mellitus) with peripheral vascular complication (HCC) 08/20/2021   Atypical chest pain 07/17/2021   Hyponatremia 07/17/2021   Leg wound, left 07/17/2021   Essential hypertension 07/17/2021   Acute hyperglycemia 07/09/2021   Left leg cellulitis 04/11/2021   Cellulitis of left leg 03/05/2021   Cellulitis 02/18/2021   PAD (peripheral artery disease) (HCC) 10/10/2020   Osteomyelitis of fourth toe of right foot (HCC)    S/P CABG x 4 07/24/2020   Non-ST elevation (NSTEMI) myocardial infarction (HCC) 07/22/2020   Acute on chronic diastolic heart failure (HCC) 07/22/2020   Hypotension 07/18/2020   Left leg swelling 07/18/2020   Elevated troponin 07/18/2020   AKI (acute kidney injury) (HCC) 07/18/2020   Elevated brain natriuretic peptide (BNP) level 07/18/2020   GERD (gastroesophageal reflux disease) 07/18/2020   Unstable angina (HCC) 11/01/2019   Edema 06/13/2019   Leukocytosis 05/31/2019   Smoker  03/08/2019   Chronic pain 03/08/2019   Chronic back pain 02/16/2019   Deep venous thrombosis (HCC) 02/16/2019   Obesity, Class III, BMI 40-49.9 (morbid obesity) (HCC) 02/16/2019   Peripheral nerve disease 02/16/2019   COPD (chronic obstructive pulmonary disease) (HCC) 01/22/2019   Anxiety disorder 03/26/2018   Open wound of toe 10/12/2017   Sinusitis 10/12/2017   Abdominal pain    Loss of weight    Diabetic foot infection (HCC) 09/16/2017   Uncontrolled type 2 diabetes mellitus with hyperglycemia, with long-term current use of insulin (HCC) 09/16/2017   Cellulitis of right foot    Chronic ulcer of great toe of right foot (HCC) 09/09/2017   Recurrent chest pain 07/29/2016   Hyperglycemia    Insulin dependent type 2 diabetes mellitus (HCC) 04/29/2015   OSA (obstructive sleep apnea) 05/08/2013   History of pulmonary embolus (PE) 04/27/2013   CAD -S/P PCI 11/02/19  09/10/2008   Hyperlipidemia 09/09/2008    Orientation RESPIRATION BLADDER Height & Weight     Self, Time, Situation, Place  Normal Continent Weight: (!) 344 lb (156 kg) Height:  6\' 5"  (195.6 cm)  BEHAVIORAL SYMPTOMS/MOOD NEUROLOGICAL BOWEL NUTRITION STATUS      Continent Diet (see dc summary)  AMBULATORY STATUS COMMUNICATION OF NEEDS Skin   Limited Assist Verbally Surgical wounds (Closed incision on foot)                       Personal Care Assistance Level of Assistance  Bathing, Feeding, Dressing Bathing Assistance: Maximum assistance Feeding  assistance: Independent Dressing Assistance: Limited assistance     Functional Limitations Info             SPECIAL CARE FACTORS FREQUENCY  PT (By licensed PT), OT (By licensed OT)     PT Frequency: 5x/week OT Frequency: 5x/week            Contractures Contractures Info: Not present    Additional Factors Info  Code Status, Allergies, Psychotropic, Insulin Sliding Scale Code Status Info: Full Allergies Info: Sulfa antibiotics Psychotropic Info:  Xanax, wellbutrin Insulin Sliding Scale Info: see dc summary       Current Medications (11/24/2021):  This is the current hospital active medication list Current Facility-Administered Medications  Medication Dose Route Frequency Provider Last Rate Last Admin   0.9 %  sodium chloride infusion   Intravenous PRN [name], MD   Stopped at 11/20/21 [phone]   acetaminophen (TYLENOL) tablet 650 mg  650 mg Oral Q6H PRN [name], MD       Or   acetaminophen (TYLENOL) suppository 650 mg  650 mg Rectal Q6H PRN [name], MD       albuterol (PROVENTIL) (2.5 MG/3ML) 0.083% nebulizer solution 3 mL  3 mL Inhalation Q6H PRN [name], MD       ALPRAZolam Prudy Feeler) tablet 0.5 mg  0.5 mg Oral QHS Elgergawy, Leana Roe, MD   0.5 mg at 11/23/21 2136   alum & mag hydroxide-simeth (MAALOX/MYLANTA) 200-200-20 MG/5ML suspension 15-30 mL  15-30 mL Oral Q2H PRN [name], MD       ascorbic acid (VITAMIN C) tablet 1,000 mg  1,000 mg Oral Daily [name], MD   1,000 mg at 11/24/21 0856   aspirin EC tablet 81 mg  81 mg Oral Daily [name], MD   81 mg at 11/24/21 0857   atorvastatin (LIPITOR) tablet 80 mg  80 mg Oral q1800 [name], MD   80 mg at 11/23/21 1720   bisacodyl (DULCOLAX) EC tablet 5 mg  5 mg Oral Daily PRN [name], MD       buPROPion [facility] SR) 12 hr tablet 150 mg  150 mg Oral BID [name], MD   150 mg at 11/24/21 0801   carvedilol (COREG) tablet 3.125 mg  3.125 mg Oral BID [name], MD   3.125 mg at 11/24/21 0856   clopidogrel (PLAVIX) tablet 75 mg  75 mg Oral Daily Elgergawy, Leana Roe, MD   75 mg at 11/24/21 0855   docusate sodium (COLACE) capsule 100 mg  100 mg Oral Daily [name], MD   100 mg at 11/24/21 0855   enoxaparin (LOVENOX) injection 80 mg  80 mg Subcutaneous Q24H [name], MD   80 mg at 11/23/21 1720   guaiFENesin-dextromethorphan (ROBITUSSIN DM) 100-10 MG/5ML syrup 15 mL  15 mL Oral Q4H PRN [name], MD        hydrALAZINE (APRESOLINE) injection 5 mg  5 mg Intravenous Q20 Min PRN [name], MD       insulin aspart (novoLOG) injection 0-20 Units  0-20 Units Subcutaneous TID WC [name], MD   3 Units at 11/24/21 0801   insulin aspart (novoLOG) injection 0-5 Units  0-5 Units Subcutaneous QHS [name], MD   2 Units at 11/22/21 2202   insulin aspart (novoLOG) injection 8 Units  8 Units Subcutaneous TID WC Elgergawy, Leana Roe, MD   8 Units  at 11/24/21 1216   insulin glargine-yfgn (SEMGLEE) injection 65 Units  65 Units Subcutaneous BID Elgergawy, Leana Roe, MD       labetalol (NORMODYNE) injection 10 mg  10 mg Intravenous Q10 min PRN [name], MD       lactated ringers bolus 1,000 mL  1,000 mL Intravenous Once Chotiner, Claudean Severance, MD       [START ON 11/25/2021] lamoTRIgine (LAMICTAL) tablet 25 mg  25 mg Oral Daily Lauro Franklin, MD       magnesium sulfate IVPB 2 g 50 mL  2 g Intravenous Daily PRN [name], MD       metoprolol tartrate (LOPRESSOR) injection 2-5 mg  2-5 mg Intravenous Q2H PRN [name], MD       mupirocin ointment (BACTROBAN) 2 % 1 application  1 application Nasal BID [name], MD   1 application at 11/24/21 0855   naloxone (NARCAN) nasal spray 4 mg/0.1 mL  4 mg Nasal PRN [name], MD       nutrition supplement (JUVEN) (JUVEN) powder packet 1 packet  1 packet Oral BID BM [name], MD   1 packet at 11/24/21 1359   ondansetron (ZOFRAN) injection 4 mg  4 mg Intravenous Q6H PRN [name], MD       oxyCODONE (Oxy IR/ROXICODONE) immediate release tablet 7.5 mg  7.5 mg Oral Q6H PRN Elgergawy, Leana Roe, MD   7.5 mg at 11/24/21 1501   pantoprazole (PROTONIX) EC tablet 40 mg  40 mg Oral Daily [name], MD   40 mg at 11/24/21 0857   phenol (CHLORASEPTIC) mouth spray 1 spray  1 spray Mouth/Throat PRN [name], MD       polyethylene glycol (MIRALAX / GLYCOLAX) packet 17 g  17 g Oral Daily PRN [name], MD       potassium chloride  SA (KLOR-CON M) CR tablet 20-40 mEq  20-40 mEq Oral Daily PRN [name], MD       ranolazine (RANEXA) 12 hr tablet 500 mg  500 mg Oral BID [name], MD   500 mg at 11/24/21 6578   zinc sulfate capsule 220 mg  220 mg Oral Daily [name], MD   220 mg at 11/24/21 4696     Discharge Medications: Please see discharge summary for a list of discharge medications.  Relevant Imaging Results:  Relevant Lab Results:   Additional Information SS#:[id number]. No covid vaccines in system. Weighs 344 (6'5)  Mearl Latin, LCSW
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
## 2021-11-25 DIAGNOSIS — F319 Bipolar disorder, unspecified: Secondary | ICD-10-CM

## 2021-11-25 LAB — RENAL FUNCTION PANEL
Albumin: 1.8 g/dL — ABNORMAL LOW (ref 3.5–5.0)
Anion gap: 6 (ref 5–15)
BUN: 24 mg/dL — ABNORMAL HIGH (ref 6–20)
CO2: 25 mmol/L (ref 22–32)
Calcium: 8 mg/dL — ABNORMAL LOW (ref 8.9–10.3)
Chloride: 105 mmol/L (ref 98–111)
Creatinine, Ser: 1.2 mg/dL (ref 0.61–1.24)
GFR, Estimated: >60 mL/min (ref >60)
Glucose, Bld: 183 mg/dL — ABNORMAL HIGH (ref 70–99)
Phosphorus: 4.6 mg/dL (ref 2.5–4.6)
Potassium: 4.1 mmol/L (ref 3.5–5.1)
Sodium: 136 mmol/L (ref 135–145)

## 2021-11-25 LAB — GLUCOSE, CAPILLARY
Glucose-Capillary: 62 mg/dL — ABNORMAL LOW (ref 70–99)
Glucose-Capillary: 76 mg/dL (ref 70–99)
Glucose-Capillary: 173 mg/dL — ABNORMAL HIGH (ref 70–99)
Glucose-Capillary: 180 mg/dL — ABNORMAL HIGH (ref 70–99)
Glucose-Capillary: 125 mg/dL — ABNORMAL HIGH (ref 70–99)

## 2021-11-25 LAB — RESP PANEL BY RT-PCR (FLU A&B, COVID) ARPGX2
Influenza A by PCR: NEGATIVE
Influenza B by PCR: NEGATIVE
SARS Coronavirus 2 by RT PCR: NEGATIVE

## 2021-11-25 MED ORDER — ISOSORBIDE MONONITRATE ER 60 MG PO TB24: 60.0000 mg | ORAL_TABLET | Freq: Every day | ORAL | 2 refills | Status: DC

## 2021-11-25 MED ORDER — PREGABALIN 150 MG PO CAPS: 150.0000 mg | ORAL_CAPSULE | Freq: Every evening | ORAL | 0 refills | Status: DC

## 2021-11-25 MED ORDER — OXYCODONE HCL 7.5 MG PO TABS: 7.5000 mg | ORAL_TABLET | Freq: Four times a day (QID) | ORAL | 0 refills | Status: DC | PRN

## 2021-11-25 MED ORDER — ALPRAZOLAM 1 MG PO TABS: 0.5000 mg | ORAL_TABLET | Freq: Every day | ORAL | 0 refills | Status: DC

## 2021-11-25 MED ORDER — LAMOTRIGINE 25 MG PO TABS: 25.0000 mg | ORAL_TABLET | Freq: Every day | ORAL | 0 refills | Status: DC

## 2021-11-25 MED ORDER — JUVEN PO PACK: 1.0000 | PACK | Freq: Two times a day (BID) | ORAL | 0 refills | Status: DC

## 2021-11-25 MED ORDER — PREGABALIN 150 MG PO CAPS
300.0000 mg | ORAL_CAPSULE | Freq: Every morning | ORAL | 0 refills | Status: DC
Start: 2021-11-25 — End: 2022-07-13

## 2021-11-25 MED ORDER — BUPROPION HCL ER (SR) 150 MG PO TB12: 150.0000 mg | ORAL_TABLET | Freq: Two times a day (BID) | ORAL | 0 refills | Status: DC

## 2021-11-25 MED ORDER — INSULIN GLARGINE 100 UNIT/ML ~~LOC~~ SOLN: 58.0000 [IU] | Freq: Two times a day (BID) | SUBCUTANEOUS | 11 refills | Status: DC

## 2021-11-25 MED ORDER — INSULIN ASPART 100 UNIT/ML FLEXPEN: 6.0000 [IU] | PEN_INJECTOR | Freq: Three times a day (TID) | SUBCUTANEOUS | Status: DC

## 2021-11-25 NOTE — Plan of Care (Signed)
  Problem: Education: Goal: Knowledge of General Education information will improve Description: Including pain rating scale, medication(s)/side effects and non-pharmacologic comfort measures Outcome: Completed/Met   Problem: Health Behavior/Discharge Planning: Goal: Ability to manage health-related needs will improve Outcome: Completed/Met   Problem: Clinical Measurements: Goal: Ability to maintain clinical measurements within normal limits will improve Outcome: Completed/Met Goal: Will remain free from infection Outcome: Completed/Met Goal: Diagnostic test results will improve Outcome: Progressing Goal: Cardiovascular complication will be avoided Outcome: Completed/Met   Problem: Nutrition: Goal: Adequate nutrition will be maintained Outcome: Completed/Met   Problem: Elimination: Goal: Will not experience complications related to bowel motility Outcome: Completed/Met   Problem: Pain Managment: Goal: General experience of comfort will improve Outcome: Progressing   Problem: Safety: Goal: Ability to remain free from injury will improve Outcome: Progressing   Problem: Clinical Measurements: Goal: Ability to avoid or minimize complications of infection will improve Outcome: Progressing
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
## 2021-11-25 NOTE — TOC Progression Note (Addendum)
Transition of Care [facility]) - Initial/Assessment Note    Patient Details  Name: Joseph [name] MRN: [id number] Date of Birth: 07-11-1971  Transition of Care [facility]) CM/SW Contact:    Milinda Antis, [place] Phone Number: 11/25/2021, 11:20 AM  Clinical Narrative:                 CSW spoke with the patient's spouse and informed her that [facility] in [place] can accept.  The spouse was reluctant, but agreed for the patient to go to a facility in [place].  CSW contacted the facility and spoke with Debbie in admissions.  The facility is able to accept the patient.  CSW requested that [facility] CMA's begin insurance auth.   13:00-  CSW met with the patient at bedside.  The patient was agreeable to [facility].  The facility can accept the patient first thing tomorrow.   Expected Discharge Plan: Skilled Nursing Facility Barriers to Discharge: SNF Pending bed offer, Insurance Authorization (Bariatric bed)   Patient Goals and CMS Choice Patient states their goals for this hospitalization and ongoing recovery are:: To go home CMS Medicare.gov Compare Post Acute Care list provided to:: Patient    Expected Discharge Plan and Services Expected Discharge Plan: [place]   Discharge Planning Services: CM Consult   Living arrangements for the past 2 months: Single Family Home Expected Discharge Date: 11/25/21                                    Prior Living Arrangements/Services Living arrangements for the past 2 months: Single Family Home Lives with:: Spouse, Adult Children Patient language and need for interpreter reviewed:: Yes Do you feel safe going back to the place where you live?: Yes      Need for Family Participation in Patient Care: Yes (Comment) Care giver support system in place?: Yes (comment) Current home services: DME (Walker, cane, shower seat.) Criminal Activity/Legal Involvement Pertinent to Current Situation/Hospitalization: No - Comment as  needed  Activities of Daily Living Home Assistive Devices/Equipment: None ADL Screening (condition at time of admission) Patient's cognitive ability adequate to safely complete daily activities?: Yes Is the patient deaf or have difficulty hearing?: No Does the patient have difficulty seeing, even when wearing glasses/contacts?: No Does the patient have difficulty concentrating, remembering, or making decisions?: No Patient able to express need for assistance with ADLs?: Yes Does the patient have difficulty dressing or bathing?: No Independently performs ADLs?: Yes (appropriate for developmental age) Does the patient have difficulty walking or climbing stairs?: No Weakness of Legs: Both Weakness of Arms/Hands: None  Permission Sought/Granted Permission sought to share information with : Case Manager, Family Supports Permission granted to share information with : Yes, Verbal Permission Granted     Permission granted to share info w AGENCY: SNFs  Permission granted to share info w Relationship: Spouse     Emotional Assessment Appearance:: Appears stated age Attitude/Demeanor/Rapport: Engaged Affect (typically observed): Accepting, Appropriate, Calm, Hopeful Orientation: : Oriented to Self, Oriented to Place, Oriented to  Time, Oriented to Situation Alcohol / Substance Use: Not Applicable Psych Involvement: No (comment)  Admission diagnosis:  Wound infection [T14.8XXA, L08.9] Sepsis ([place]) [A41.9] Sepsis, due to unspecified organism, unspecified whether acute organ dysfunction present [facility]) [A41.9] Patient Active Problem List   Diagnosis Date Noted   Severe sepsis ([place]) 11/18/2021   Chest pain 11/18/2021   Dyspnea 11/18/2021   Wound infection  Ulcer of left foot with necrosis of bone (HCC)    Bicuspid aortic valve 09/14/2021   Aneurysm of ascending aorta 09/14/2021   Hyperkalemia 08/20/2021   DM (diabetes mellitus) with peripheral vascular complication ([place]) 57/12/7791    Atypical chest pain 07/17/2021   Hyponatremia 07/17/2021   Leg wound, left 07/17/2021   Essential hypertension 07/17/2021   Acute hyperglycemia 07/09/2021   Left leg cellulitis 04/11/2021   Cellulitis of left leg 03/05/2021   Cellulitis 02/18/2021   PAD (peripheral artery disease) ([place]) 10/10/2020   Osteomyelitis of fourth toe of right foot ([place])    S/P CABG x 4 07/24/2020   Non-ST elevation (NSTEMI) myocardial infarction ([place]) 07/22/2020   Acute on chronic diastolic heart failure ([place]) 07/22/2020   Hypotension 07/18/2020   Left leg swelling 07/18/2020   Elevated troponin 07/18/2020   AKI (acute kidney injury) ([place]) 07/18/2020   Elevated brain natriuretic peptide (BNP) level 07/18/2020   GERD (gastroesophageal reflux disease) 07/18/2020   Unstable angina ([place]) 11/01/2019   Edema 06/13/2019   Leukocytosis 05/31/2019   Smoker 03/08/2019   Chronic pain 03/08/2019   Chronic back pain 02/16/2019   Deep venous thrombosis ([place]) 02/16/2019   Obesity, Class III, BMI 40-49.9 (morbid obesity) ([place]) 02/16/2019   Peripheral nerve disease 02/16/2019   COPD (chronic obstructive pulmonary disease) ([place]) 01/22/2019   Anxiety disorder 03/26/2018   Open wound of toe 10/12/2017   Sinusitis 10/12/2017   Abdominal pain    Loss of weight    Diabetic foot infection ([place]) 09/16/2017   Uncontrolled type 2 diabetes mellitus with hyperglycemia, with long-term current use of insulin ([place]) 09/16/2017   Cellulitis of right foot    Chronic ulcer of great toe of right foot ([place]) 09/09/2017   Recurrent chest pain 07/29/2016   Hyperglycemia    Insulin dependent type 2 diabetes mellitus ([place]) 04/29/2015   OSA (obstructive sleep apnea) 05/08/2013   History of pulmonary embolus (PE) 04/27/2013   CAD -S/P PCI 11/02/19  09/10/2008   Hyperlipidemia 09/09/2008   PCP:  [name], NP Pharmacy:   [facility] [place] [place] [address] Phone: [phone] Fax: [phone]  [id number] - [place], [place] - [address] AT [address] [place] [place] [address] Phone: [phone] Fax: [address] [place] [address] Phone: [phone] Fax: [phone]     Social Determinants of Health (SDOH) Interventions    Readmission Risk Interventions Readmission Risk Prevention Plan 11/24/2021 08/20/2021 07/12/2021  Transportation Screening Complete Complete Complete  Medication Review ([place]) Complete Complete Complete  PCP or Specialist appointment within 3-5 days of discharge Complete Complete Complete  HRI or Home Care Consult Complete Complete Complete  SW Recovery Care/Counseling Consult Complete Complete Complete  Palliative Care Screening Not Applicable Not Applicable Not Applicable  Skilled Nursing Facility Complete Not Applicable Not Applicable  Some recent data might be hidden

## 2021-11-25 NOTE — Progress Notes (Signed)
Hypoglycemic Event  CBG: 62  Treatment: 8 oz juice/soda  Symptoms: Sweaty  Follow-up CBG: [id number] CBG Result:76  Possible Reasons for Event: Medication regimen: high dose Semyglee  Comments/MD notified:Per hypoglycemia protocol    Ayah Cozzolino Joselita

## 2021-11-25 NOTE — Discharge Summary (Addendum)
Physician Discharge Summary  Joseph [name] JJK:[id number] DOB: 30-Oct-1971 DOA: 11/17/2021  PCP: [name], NP  Admit date: 11/17/2021 Discharge date: 11/25/2021  Time spent: 47 minutes  Recommendations for Outpatient Follow-up:  Note dosage changes of various medications including insulins as was slightly hypoglycemic during hospitalization Careful reinitiation of Lyrica given has polypharmacy from multiple pain meds another psychotropic medication-we will need to be monitored closely in the setting of his continued use of CPAP for chronic respiratory failure/OSA Recommend renal panel CBC in about 1 week Recommend further wound care with Unna boot continue on right side and wound VAC on left side to be followed by Dr. Sharol Given in the outpatient setting--this will need to be accomplished  by the end of this week Will need psych follow up for titration of psychotropics   Discharge Diagnoses:  MAIN problem for hospitalization   Severe sepsis on admission secondary to left lower leg cellulitis   Please see below for itemized issues addressed in HOpsital- refer to other progress notes for clarity if needed  Discharge Condition: Improved  Diet recommendation: Diabetic  Filed Weights   11/17/21 2243 11/20/21 0939  Weight: (!) 156 kg (!) 156 kg    History of present illness:  50 year old obese white male BMI 40.7 DM TY 2 with severe neuropathy as well as resulting CKD 3 Left diabetic foot ulcers with chronic venous insufficiency superimposed CAD status post CABG PCI--circumflex stenosis status post BMS 07/2016, CABG 07/24/2020 x4 Prior DVT and PE OSA and continued tobacco use PAD status post left popliteal artery stenting 09/2020 --arteriogram 02/2021 had inline flow to left foot  Presented from the ED with sepsis from left lower foot osteomyelitis based on MRI and constellation of findings Underwent TMA 11/20/2021    Hospital Course:  Severe sepsis secondary to l cellulitis  bilateral foot wounds MRI left second and second middle osteomyelitis and cellulitis Underwent transmetatarsal amputation 12/2 and antibiotics were stopped Needs therapy on discharge Chronic right venous stasis ulcer Will need wound care with Unna boot on right side wound VAC on left side-CC Dr. Sharol Given Poorly controlled DM TY 2 with hypoglycemia and hyperglycemia-A1c 10 Sugars on discharge were 60s overnight Patient was resumed on insulins but at lower dosing as below 58 units of Semglee and 6 units 3 times daily of insulin Resumed Victoza on discharge, resume metformin 1000 twice daily CAD with CABG + PCI HTN Meds were reintroduced prior to discharge-we cut back his dosing of Coreg He will go home on lower dose of Imdur Patient was resumed on aspirin Plavix on discharge Patient will discharge on statin, Vascepa and will need lipid panel in the outpatient setting AKI on admission secondary to sepsis with hyperkalemia This resolved during hospital stay--he was given 1 dose of Lokelma which resolved at His Lasix was stopped on admission Discharge BUN and creatinine are 24 and 1.2 Chronic pain syndrome Low back pain chronic High risk for metabolic encephalopathy from meds Patient was placed on lower doses of oxycodone 7.5, he was kept on some Xanax ABG was done this hospital stay showing no CO2 retention Needs naloxone in the outpatient setting COPD continued smoker OSA with OHSS habitus See above-very careful with meds Moderate to severe depression Psychiatry saw the patient during hospitalization-they made recommendations to continue Wellbutrin 150 twice daily and started Lamictal Will need outpatient follow-up  Procedures: 11/20/2021-left TMA  Consultations: Orthopedics Dr. Sharol Given  Discharge Exam: Vitals:   11/25/21 0452 11/25/21 0726  BP: 98/62 (!) 144/87  Pulse:  88 84  Resp: 18 17  Temp: 98.4 F (36.9 C) 98 F (36.7 C)  SpO2: 94% 100%    Subj on day of d/c   Arouses  readily but was sleepy when I first met him-seems to overall understand he needs to go to rehab No specific complaints   General Exam on discharge  Awake obese thick neck Mallampati 4 No icterus no pallor Neck soft supple ROM intact Left leg has Aircast on it with wound VAC Right leg has Unna boots on Abdomen is obese nontender no rebound Chest is clear no added sound no rales rhonchi  Discharge Instructions   Discharge Instructions     Diet - low sodium heart healthy   Complete by: As directed    Increase activity slowly   Complete by: As directed    Leave dressing on - Keep it clean, dry, and intact until clinic visit   Complete by: As directed    Also keep negative pressure wound therapy on until can be reviewed by orthopedics Dr. Sharol Given   Negative Pressure Wound Therapy - Incisional   Complete by: As directed       Allergies as of 11/25/2021       Reactions   Sulfa Antibiotics Other (See Comments)   Headaches        Medication List     STOP taking these medications    escitalopram 10 MG tablet Commonly known as: LEXAPRO   fluconazole 100 MG tablet Commonly known as: DIFLUCAN   furosemide 40 MG tablet Commonly known as: LASIX   meclizine 25 MG tablet Commonly known as: ANTIVERT   nitroGLYCERIN 0.4 MG SL tablet Commonly known as: NITROSTAT   rosuvastatin 40 MG tablet Commonly known as: CRESTOR       TAKE these medications    albuterol 108 (90 Base) MCG/ACT inhaler Commonly known as: VENTOLIN HFA Inhale 2 puffs into the lungs every 6 (six) hours as needed for wheezing or shortness of breath.   ALPRAZolam 1 MG tablet Commonly known as: XANAX Take 0.5 tablets (0.5 mg total) by mouth at bedtime. 1 mg in the morning  2 mg at bedtime What changed:  how much to take when to take this   aspirin EC 81 MG tablet Take 81 mg by mouth daily. Swallow whole.   atorvastatin 80 MG tablet Commonly known as: LIPITOR Take 1 tablet (80 mg total) by mouth  daily at 6 PM.   buPROPion 150 MG 12 hr tablet Commonly known as: WELLBUTRIN SR Take 1 tablet (150 mg total) by mouth 2 (two) times daily.   carvedilol 3.125 MG tablet Commonly known as: COREG Take 3.125 mg by mouth 2 (two) times daily. What changed: Another medication with the same name was removed. Continue taking this medication, and follow the directions you see here.   clopidogrel 75 MG tablet Commonly known as: PLAVIX Take 1 tablet (75 mg total) by mouth daily with breakfast.   fluticasone 50 MCG/ACT nasal spray Commonly known as: FLONASE Place 2 sprays into both nostrils daily.   insulin aspart 100 UNIT/ML FlexPen Commonly known as: NOVOLOG Inject 6 Units into the skin 3 (three) times daily with meals. Per sliding scale What changed:  how much to take additional instructions   insulin glargine 100 UNIT/ML injection Commonly known as: LANTUS Inject 0.58 mLs (58 Units total) into the skin 2 (two) times daily. What changed: how much to take   isosorbide mononitrate 60 MG 24 hr tablet Commonly known  as: IMDUR Take 1 tablet (60 mg total) by mouth daily. What changed: how much to take   lamoTRIgine 25 MG tablet Commonly known as: LAMICTAL Take 1 tablet (25 mg total) by mouth daily. Start taking on: November 26, 2021   liraglutide 18 MG/3ML Sopn Commonly known as: VICTOZA Inject 1.8 mg into the skin daily.   metFORMIN 1000 MG tablet Commonly known as: GLUCOPHAGE Take 1 tablet (1,000 mg total) by mouth 2 (two) times daily with a meal.   mupirocin ointment 2 % Commonly known as: BACTROBAN Place 1 application into the nose 2 (two) times daily. What changed: additional instructions   mupirocin ointment 2 % Commonly known as: BACTROBAN Apply topically 2 (two) times daily. What changed: Another medication with the same name was changed. Make sure you understand how and when to take each.   naloxone 4 MG/0.1ML Liqd nasal spray kit Commonly known as: NARCAN Place 4  mg into the nose as needed (overdose).   nutrition supplement (JUVEN) Pack Take 1 packet by mouth 2 (two) times daily between meals.   oxyCODONE HCl 7.5 MG Tabs Take 7.5 mg by mouth every 6 (six) hours as needed for severe pain. What changed:  medication strength how much to take when to take this reasons to take this   pantoprazole 40 MG tablet Commonly known as: PROTONIX Take 1 tablet (40 mg total) by mouth daily. What changed: how much to take   pregabalin 150 MG capsule Commonly known as: LYRICA Take 2 capsules (300 mg total) by mouth in the morning.   pregabalin 150 MG capsule Commonly known as: LYRICA Take 1 capsule (150 mg total) by mouth at bedtime.   ranolazine 500 MG 12 hr tablet Commonly known as: RANEXA Take 1 tablet (500 mg total) by mouth 2 (two) times daily.   Vascepa 1 g capsule Generic drug: icosapent Ethyl Take 2 capsules (2 g total) by mouth 2 (two) times daily.               Discharge Care Instructions  (From admission, onward)           Start     Ordered   11/25/21 0000  Leave dressing on - Keep it clean, dry, and intact until clinic visit       Comments: Also keep negative pressure wound therapy on until can be reviewed by orthopedics Dr. Sharol Given   11/25/21 1012           Allergies  Allergen Reactions   Sulfa Antibiotics Other (See Comments)    Headaches     Follow-up Information     Newt Minion, MD Follow up in 1 week(s).   Specialty: Orthopedic Surgery Contact information: [place] [address] [phone]                  The results of significant diagnostics from this hospitalization (including imaging, microbiology, ancillary and laboratory) are listed below for reference.    Significant Diagnostic Studies: DG Chest 2 View  Result Date: 11/17/2021 CLINICAL DATA:  Chest pain. EXAM: CHEST - 2 VIEW COMPARISON:  Chest x-ray 08/15/2021. FINDINGS: Sternotomy wires are again seen. The heart  size and mediastinal contours are within normal limits. Both lungs are clear. The visualized skeletal structures are unremarkable. IMPRESSION: No active cardiopulmonary disease. Electronically Signed   By: Ronney Asters M.D.   On: 11/17/2021 22:29   MR FOOT LEFT WO CONTRAST  Result Date: 11/19/2021 CLINICAL DATA:  Foot swelling.  Evaluate for osteomyelitis. EXAM: MRI OF THE LEFT FOOT WITHOUT CONTRAST TECHNIQUE: Multiplanar, multisequence MR imaging of the left foot was performed. No intravenous contrast was administered. COMPARISON:  03/05/2021 FINDINGS: Patient motion degrades image quality limiting evaluation. Bones/Joint/Cartilage No acute fracture or dislocation. Prior partial amputation of the fourth metatarsal and phalanges. Large wound along the dorsal aspect of the forefoot. Bone marrow edema in the second distal phalanx and head of the second middle phalanx concerning for osteomyelitis. No other marrow signal abnormality. Normal alignment. No joint effusion. Ligaments Collateral ligaments are intact.  Lisfranc ligament is intact. Muscles and Tendons Generalized muscle atrophy. Flexor, extensor, peroneal tendons are intact. Soft tissue No fluid collection or hematoma. No soft tissue mass. Severe soft tissue edema along the dorsal aspect of the foot. Soft tissue wound along the plantar aspect of the foot at the level of the second and third metatarsal heads. Soft tissue edema of the second phalanx. Small amount of fluid in the first and second intermetatarsal bursa. IMPRESSION: 1. Soft tissue wound along the dorsal aspect of the forefoot at the level of the fourth metatarsal amputation and plantar aspect of the forefoot at the level of the second and third metatarsal heads. 2. Bone marrow edema in the second distal phalanx and head of the second middle phalanx concerning for osteomyelitis. 3. Generalized soft tissue edema along the dorsal aspect of the foot extending into the second phalanx and to lesser  extent along the plantar aspect of the foot most concerning for cellulitis which extends towards the ankle. Electronically Signed   By: Kathreen Devoid M.D.   On: 11/19/2021 07:03   DG Foot Complete Left  Result Date: 11/17/2021 CLINICAL DATA:  Concern for osteomyelitis. EXAM: LEFT FOOT - COMPLETE 3+ VIEW COMPARISON:  Left toe x-ray 10/21/2021 3 FINDINGS: There is no acute fracture or dislocation. There is been prior amputation of the distal second metatarsal and phalanx. Soft tissue ulceration is seen in this region. There is no cortical erosion or periosteal reaction. There is no acute fracture or dislocation. IMPRESSION: 1. Prior amputation of the second toe and distal second metatarsal with overlying soft tissue ulceration. 2. No acute bony abnormality. Electronically Signed   By: Ronney Asters M.D.   On: 11/17/2021 22:31   DG Foot Complete Right  Result Date: 11/17/2021 CLINICAL DATA:  Wound. EXAM: RIGHT FOOT COMPLETE - 3+ VIEW COMPARISON:  None. FINDINGS: There has been prior amputation of the first toe. There is soft tissue swelling of the forefoot. There is a linear radiopaque foreign body measuring 12 mm in the plantar soft tissues at the level of the fifth metatarsal. There is no evidence for fracture or dislocation. There is no cortical erosion or periosteal reaction. IMPRESSION: 1. Soft tissue swelling of the forefoot with plantar soft tissue foreign body. 2. Amputation of the first toe. 3. No acute bony abnormality. Electronically Signed   By: Ronney Asters M.D.   On: 11/17/2021 22:33   VAS [place] LOWER EXTREMITY VENOUS (DVT)  Result Date: 11/18/2021  Lower Venous DVT Study Patient Name:  Joseph [name] [facility]  Date of Exam:   11/18/2021 Medical Rec #: [id number]        Accession #:    [id number] Date of Birth: Jun 17, 1971        Patient Gender: M Patient Age:   34 years Exam Location:  [facility] Procedure:      VAS [place] LOWER EXTREMITY VENOUS (DVT) Referring Phys: Wandra Feinstein RATHORE  --------------------------------------------------------------------------------  Indications:  Swelling.  Risk Factors: None identified. Limitations: Body habitus and poor ultrasound/tissue interface. Comparison Study: No prior studies. Performing Technologist: Oliver Hum RVT  Examination Guidelines: A complete evaluation includes B-mode imaging, spectral Doppler, color Doppler, and power Doppler as needed of all accessible portions of each vessel. Bilateral testing is considered an integral part of a complete examination. Limited examinations for reoccurring indications may be performed as noted. The reflux portion of the exam is performed with the patient in reverse Trendelenburg.  +---------+---------------+---------+-----------+----------+--------------+ RIGHT    CompressibilityPhasicitySpontaneityPropertiesThrombus Aging +---------+---------------+---------+-----------+----------+--------------+ CFV      Full           Yes      Yes                                 +---------+---------------+---------+-----------+----------+--------------+ SFJ      Full                                                        +---------+---------------+---------+-----------+----------+--------------+ FV Prox  Full                                                        +---------+---------------+---------+-----------+----------+--------------+ FV Mid   Full                                                        +---------+---------------+---------+-----------+----------+--------------+ FV DistalFull                                                        +---------+---------------+---------+-----------+----------+--------------+ PFV      Full                                                        +---------+---------------+---------+-----------+----------+--------------+ POP      Full           Yes      Yes                                  +---------+---------------+---------+-----------+----------+--------------+ PTV      Full                                                        +---------+---------------+---------+-----------+----------+--------------+ PERO     Full                                                        +---------+---------------+---------+-----------+----------+--------------+   +---------+---------------+---------+-----------+----------+--------------+  LEFT     CompressibilityPhasicitySpontaneityPropertiesThrombus Aging +---------+---------------+---------+-----------+----------+--------------+ CFV      Full           Yes      Yes                                 +---------+---------------+---------+-----------+----------+--------------+ SFJ      Full                                                        +---------+---------------+---------+-----------+----------+--------------+ FV Prox  Full                                                        +---------+---------------+---------+-----------+----------+--------------+ FV Mid   Full                                                        +---------+---------------+---------+-----------+----------+--------------+ FV DistalFull                                                        +---------+---------------+---------+-----------+----------+--------------+ PFV      Full                                                        +---------+---------------+---------+-----------+----------+--------------+ POP      Full           Yes      Yes                                 +---------+---------------+---------+-----------+----------+--------------+ PTV      Full                                                        +---------+---------------+---------+-----------+----------+--------------+ PERO     Full                                                         +---------+---------------+---------+-----------+----------+--------------+     Summary: RIGHT: - There is no evidence of deep vein thrombosis in the lower extremity. However, portions of this examination were limited- see technologist comments above.  - No cystic structure found in the popliteal fossa.  LEFT: - There is no evidence  of deep vein thrombosis in the lower extremity. However, portions of this examination were limited- see technologist comments above.  - No cystic structure found in the popliteal fossa.  *See table(s) above for measurements and observations. Electronically signed by Jamelle Haring on 11/18/2021 at 4:01:15 PM.    Final     Microbiology: Recent Results (from the past 240 hour(s))  Blood culture (routine x 2)     Status: None   Collection Time: 11/17/21  9:03 PM   Specimen: BLOOD  Result Value Ref Range Status   Specimen Description BLOOD LEFT ANTECUBITAL  Final   Special Requests   Final    BOTTLES DRAWN AEROBIC AND ANAEROBIC Blood Culture adequate volume   Culture   Final    NO GROWTH 5 DAYS Performed at [facility], [address] [place], [place] [address]    Report Status 11/22/2021 FINAL  Final  Blood culture (routine x 2)     Status: None   Collection Time: 11/17/21  9:03 PM   Specimen: BLOOD RIGHT HAND  Result Value Ref Range Status   Specimen Description BLOOD RIGHT HAND  Final   Special Requests   Final    AEROBIC BOTTLE ONLY Blood Culture results may not be optimal due to an inadequate volume of blood received in culture bottles   Culture   Final    NO GROWTH 5 DAYS Performed at [facility], [place] [address] [place], [place] [address]    Report Status 11/22/2021 FINAL  Final  Resp Panel by RT-PCR (Flu A&B, Covid) Nasopharyngeal Swab     Status: None   Collection Time: 11/17/21  9:45 PM   Specimen: Nasopharyngeal Swab; Nasopharyngeal(NP) swabs in vial transport medium  Result Value Ref Range Status   SARS Coronavirus 2 by RT PCR  NEGATIVE NEGATIVE Final    Comment: (NOTE) SARS-CoV-2 target nucleic acids are NOT DETECTED.  The SARS-CoV-2 RNA is generally detectable in upper respiratory specimens during the acute phase of infection. The lowest concentration of SARS-CoV-2 viral copies this assay can detect is 138 copies/mL. A negative result does not preclude SARS-Cov-2 infection and should not be used as the sole basis for treatment or other patient management decisions. A negative result may occur with  improper specimen collection/handling, submission of specimen other than nasopharyngeal swab, presence of viral mutation(s) within the areas targeted by this assay, and inadequate number of viral copies(<138 copies/mL). A negative result must be combined with clinical observations, patient history, and epidemiological information. The expected result is Negative.  Fact Sheet for Patients:  EntrepreneurPulse.com.au  Fact Sheet for Healthcare Providers:  IncredibleEmployment.be  This test is no t yet approved or cleared by the [place] FDA and  has been authorized for detection and/or diagnosis of SARS-CoV-2 by FDA under an Emergency Use Authorization (EUA). This EUA will remain  in effect (meaning this test can be used) for the duration of the COVID-19 declaration under Section 564(b)(1) of the Act, 21 U.S.C.section 360bbb-3(b)(1), unless the authorization is terminated  or revoked sooner.       Influenza A by PCR NEGATIVE NEGATIVE Final   Influenza B by PCR NEGATIVE NEGATIVE Final    Comment: (NOTE) The Xpert Xpress SARS-CoV-2/FLU/RSV plus assay is intended as an aid in the diagnosis of influenza from Nasopharyngeal swab specimens and should not be used as a sole basis for treatment. Nasal washings and aspirates are unacceptable for Xpert Xpress SARS-CoV-2/FLU/RSV testing.  Fact Sheet for Patients: EntrepreneurPulse.com.au  Fact Sheet for  Healthcare Providers: IncredibleEmployment.be  This test is not yet approved or cleared by the [place] FDA and has been authorized for detection and/or diagnosis of SARS-CoV-2 by FDA under an Emergency Use Authorization (EUA). This EUA will remain in effect (meaning this test can be used) for the duration of the COVID-19 declaration under Section 564(b)(1) of the Act, 21 U.S.C. section 360bbb-3(b)(1), unless the authorization is terminated or revoked.  Performed at [facility], [place] [address] [place], [place] [address]   Surgical pcr screen     Status: Abnormal   Collection Time: 11/19/21  7:21 AM   Specimen: Nasal Mucosa; Nasal Swab  Result Value Ref Range Status   MRSA, PCR NEGATIVE NEGATIVE Final   Staphylococcus aureus POSITIVE (A) NEGATIVE Final    Comment: (NOTE) The Xpert SA Assay (FDA approved for NASAL specimens in patients 18 years of age and older), is one component of a comprehensive surveillance program. It is not intended to diagnose infection nor to guide or monitor treatment. Performed at [facility], [place] [address] [place], [place] [address]      Labs: Basic Metabolic Panel: Recent Labs  Lab 11/20/21 0122 11/21/21 0832 11/23/21 0736 11/25/21 0838  NA 133* 134* 133* 136  K 5.4* 4.8 4.6 4.1  CL 100 102 102 105  CO2 _0 GLUCOSE 280* 330* 296* 183*  BUN 21* 22* 27* 24*  CREATININE 1.72* 1.46* 1.41* 1.20  CALCIUM 8.5* 8.2* 8.7* 8.0*  PHOS  --   --   --  4.6   Liver Function Tests: Recent Labs  Lab 11/25/21 0838  ALBUMIN 1.8*   No results for input(s): LIPASE, AMYLASE in the last 168 hours. No results for input(s): AMMONIA in the last 168 hours. CBC: Recent Labs  Lab 11/20/21 0122 11/21/21 0249 11/23/21 0736  WBC 14.6* 10.4 10.5  HGB 11.6* 9.9* 10.0*  HCT 36.8* 31.6* 31.9*  MCV 81.2 80.8 81.2  PLT 325 281 387   Cardiac Enzymes: No results for input(s): CKTOTAL, CKMB, CKMBINDEX, TROPONINI  in the last 168 hours. BNP: BNP (last 3 results) No results for input(s): BNP in the last 8760 hours.  ProBNP (last 3 results) No results for input(s): PROBNP in the last 8760 hours.  CBG: Recent Labs  Lab 11/24/21 1118 11/24/21 1632 11/24/21 2103 11/25/21 0634 11/25/21 0654  GLUCAP 113* 96 130* 62* 76       Signed:  Nita Sells MD   [facility] 11/25/2021, 10:13 AM

## 2021-11-25 NOTE — Hospital Course (Signed)
PSYCH Recommendations: -Start Lamictal 25 mg daily tomorrow -Continue Wellbutrin 150 mg BID     Future Considerations -Consider Remeron for his issues with sleep and low chance of causing a manic episode

## 2021-11-26 ENCOUNTER — Ambulatory Visit (HOSPITAL_BASED_OUTPATIENT_CLINIC_OR_DEPARTMENT_OTHER): Payer: Medicare HMO | Admitting: Cardiovascular Disease

## 2021-11-26 ENCOUNTER — Telehealth: Payer: Self-pay

## 2021-11-26 LAB — GLUCOSE, CAPILLARY
Glucose-Capillary: 213 mg/dL — ABNORMAL HIGH (ref 70–99)
Glucose-Capillary: 187 mg/dL — ABNORMAL HIGH (ref 70–99)
Glucose-Capillary: 150 mg/dL — ABNORMAL HIGH (ref 70–99)

## 2021-11-26 NOTE — Telephone Encounter (Signed)
[facility] called wanting to know if we were going to follow care with the right leg as well or do they need to assist with wound care for the right leg.   Joseph [name]  Call back # [phone]

## 2021-11-26 NOTE — Progress Notes (Signed)
DISCHARGE NOTE HOME Joseph [name] to be discharged Skilled nursing facility per MD order. Discussed prescriptions and follow up appointments with the patient. Prescriptions given to patient; medication list explained in detail. Patient verbalized understanding.  Skin clean, dry and intact without evidence of skin break down, no evidence of skin tears noted. IV catheter discontinued intact. Site without signs and symptoms of complications. Dressing and pressure applied. Pt denies pain at the site currently. No complaints noted.  Patient free of lines and drains.   An After Visit Summary (AVS) was printed and given to the patient. Patient escorted via stretcher, and discharged to [facility] via [facility].  Myrtis Hopping, RN

## 2021-11-26 NOTE — Consult Note (Signed)
WOC Nurse wound follow up Wound type:venous stasis ulceration RLE Measurement: 2.0cm x 4.ocm x 0.2cm  Wound bed:red, ruddy Drainage (amount, consistency, odor) serosanguinous, minimal  Periwound: intact, skin changes associated with venous stasis; hyperkeratosis of the heel (severe) Foot is quite edematous Dressing procedure/placement/frequency: Profore compression wrap removed, wound assessed Silvery hydrofiber to the right leg ulceration 4 Layer compression replaced Patient tolerated well.     Tyler Cubit [facility], CNS, [employer] [phone]

## 2021-11-26 NOTE — Progress Notes (Signed)
Attempted to call report at 10:30am, 12:15pm and 12:37pm to [facility] at [phone], no answer.  Jean Rosenthal, RN

## 2021-11-26 NOTE — Progress Notes (Signed)
Seen and examined and agree with prior assessment-please note amendment to discharge summary and he is stable for discharge based on my reassessment this morning 12/8  Pleas Koch, MD [facility] 10:23 AM

## 2021-11-26 NOTE — TOC Transition Note (Signed)
Transition of Care [facility]) - CM/SW Discharge Note   Patient Details  Name: Joseph [name] MRN: [id number] Date of Birth: August 08, 1971  Transition of Care [facility]) CM/SW Contact:  Ralene Bathe, LCSWA Phone Number: 11/26/2021, 10:05 AM   Clinical Narrative:     Patient will DC to: [facility] Anticipated DC date: 11/26/2021 Family notified: Yes Transport by: Sharin Mons   Per MD patient ready for DC to SNF. RN to call report prior to discharge [phone]. RN, patient, patient's family, and facility notified of DC. Discharge Summary and FL2 sent to facility. DC packet on chart. Ambulance transport requested for patient.   CSW will sign off for now as social work intervention is no longer needed. Please consult [place] again if new needs arise.      Barriers to Discharge: SNF Pending bed offer, Insurance Authorization (Bariatric bed)   Patient Goals and CMS Choice Patient states their goals for this hospitalization and ongoing recovery are:: To go home CMS Medicare.gov Compare Post Acute Care list provided to:: Patient    Discharge Placement                       Discharge Plan and Services   Discharge Planning Services: CM Consult                                 Social Determinants of Health (SDOH) Interventions     Readmission Risk Interventions Readmission Risk Prevention Plan 11/24/2021 08/20/2021 07/12/2021  Transportation Screening Complete Complete Complete  Medication Review Oceanographer) Complete Complete Complete  PCP or Specialist appointment within 3-5 days of discharge Complete Complete Complete  HRI or Home Care Consult Complete Complete Complete  SW Recovery Care/Counseling Consult Complete Complete Complete  Palliative Care Screening Not Applicable Not Applicable Not Applicable  Skilled Nursing Facility Complete Not Applicable Not Applicable  Some recent data might be hidden

## 2021-11-26 NOTE — Discharge Summary (Signed)
Physician Discharge Summary  Joseph [name] BTD:[id number] DOB: Mar 22, 1971 DOA: 11/17/2021  PCP: [name], NP  Admit date: 11/17/2021 Discharge date: 11/26/2021  Time spent: 47 minutes  Recommendations for Outpatient Follow-up:  Note dosage changes of various medications including insulins as was slightly hypoglycemic during hospitalization Careful reinitiation of Lyrica given has polypharmacy from multiple pain meds another psychotropic medication-we will need to be monitored closely in the setting of his continued use of CPAP for chronic respiratory failure/OSA Recommend renal panel CBC in about 1 week Recommend further wound care with Unna boot continue on right side and wound VAC on left side to be followed by Dr. Sharol Given in the outpatient setting--this will need to be accomplished  by the end of this week Will need psych follow up for an increase in psychotropics, watching specifically for rash with initiation of Lamictal and this can be done in 2 weeks from discharge   Discharge Diagnoses:  MAIN problem for hospitalization   Severe sepsis on admission secondary to left lower leg cellulitis   Please see below for itemized issues addressed in HOpsital- refer to other progress notes for clarity if needed  Discharge Condition: Improved  Diet recommendation: Diabetic  Filed Weights   11/17/21 2243 11/20/21 0939  Weight: (!) 156 kg (!) 156 kg    History of present illness:  50 year old obese white male BMI 40.7 DM TY 2 with severe neuropathy as well as resulting CKD 3 Left diabetic foot ulcers with chronic venous insufficiency superimposed CAD status post CABG PCI--circumflex stenosis status post BMS 07/2016, CABG 07/24/2020 x4 Prior DVT and PE OSA and continued tobacco use PAD status post left popliteal artery stenting 09/2020 --arteriogram 02/2021 had inline flow to left foot  Presented from the ED with sepsis from left lower foot osteomyelitis based on MRI and  constellation of findings Underwent TMA 11/20/2021    Hospital Course:  Severe sepsis secondary to l cellulitis bilateral foot wounds MRI left second and second middle osteomyelitis and cellulitis Underwent transmetatarsal amputation 12/2 and antibiotics were stopped Needs therapy on discharge Chronic right venous stasis ulcer Will need wound care with Unna boot on right side wound VAC on left side-CC Dr. Sharol Given Poorly controlled DM TY 2 with hypoglycemia and hyperglycemia-A1c 10 Sugars on discharge were 60s overnight Patient was resumed on insulins but at lower dosing as below 58 units of Semglee and 6 units 3 times daily of insulin Resumed Victoza on discharge, resume metformin 1000 twice daily CAD with CABG + PCI HTN Meds were reintroduced prior to discharge-we cut back his dosing of Coreg He will go home on lower dose of Imdur Patient was resumed on aspirin Plavix on discharge Patient will discharge on statin, Vascepa and will need lipid panel in the outpatient setting AKI on admission secondary to sepsis with hyperkalemia This resolved during hospital stay--he was given 1 dose of Lokelma which resolved at His Lasix was stopped on admission Discharge BUN and creatinine are 24 and 1.2 Chronic pain syndrome Low back pain chronic High risk for metabolic encephalopathy from meds Patient was placed on lower doses of oxycodone 7.5, he was kept on some Xanax ABG was done this hospital stay showing no CO2 retention Needs naloxone in the outpatient setting COPD continued smoker OSA with OHSS habitus See above-very careful with meds Moderate to severe depression Psychiatry saw the patient during hospitalization-they made recommendations to continue Wellbutrin 150 twice daily and started Lamictal Will need outpatient follow-up  Procedures: 11/20/2021-left TMA  Consultations: Orthopedics  Dr. Sharol Given  Discharge Exam: Vitals:   11/26/21 0410 11/26/21 0903  BP: 119/81 137/87  Pulse: 83  89  Resp: 17 18  Temp: 98 F (36.7 C) 98 F (36.7 C)  SpO2: 97% 98%    Subj on day of d/c   doing very well today listening to music on his phone   General Exam on discharge  No changes to exam  Awake obese thick neck Mallampati 4 No icterus no pallor Neck soft supple ROM intact Left leg has Aircast on it with wound VAC Right leg has Unna boots on Abdomen is obese nontender no rebound Chest is clear no added sound no rales rhonchi  Discharge Instructions   Discharge Instructions     Diet - low sodium heart healthy   Complete by: As directed    Increase activity slowly   Complete by: As directed    Leave dressing on - Keep it clean, dry, and intact until clinic visit   Complete by: As directed    Also keep negative pressure wound therapy on until can be reviewed by orthopedics Dr. Sharol Given   Negative Pressure Wound Therapy - Incisional   Complete by: As directed       Allergies as of 11/26/2021       Reactions   Sulfa Antibiotics Other (See Comments)   Headaches        Medication List     STOP taking these medications    escitalopram 10 MG tablet Commonly known as: LEXAPRO   fluconazole 100 MG tablet Commonly known as: DIFLUCAN   furosemide 40 MG tablet Commonly known as: LASIX   meclizine 25 MG tablet Commonly known as: ANTIVERT   nitroGLYCERIN 0.4 MG SL tablet Commonly known as: NITROSTAT   rosuvastatin 40 MG tablet Commonly known as: CRESTOR       TAKE these medications    albuterol 108 (90 Base) MCG/ACT inhaler Commonly known as: VENTOLIN HFA Inhale 2 puffs into the lungs every 6 (six) hours as needed for wheezing or shortness of breath.   ALPRAZolam 1 MG tablet Commonly known as: XANAX Take 0.5 tablets (0.5 mg total) by mouth at bedtime. 1 mg in the morning  2 mg at bedtime What changed:  how much to take when to take this   aspirin EC 81 MG tablet Take 81 mg by mouth daily. Swallow whole.   atorvastatin 80 MG  tablet Commonly known as: LIPITOR Take 1 tablet (80 mg total) by mouth daily at 6 PM.   buPROPion 150 MG 12 hr tablet Commonly known as: WELLBUTRIN SR Take 1 tablet (150 mg total) by mouth 2 (two) times daily.   carvedilol 3.125 MG tablet Commonly known as: COREG Take 3.125 mg by mouth 2 (two) times daily. What changed: Another medication with the same name was removed. Continue taking this medication, and follow the directions you see here.   clopidogrel 75 MG tablet Commonly known as: PLAVIX Take 1 tablet (75 mg total) by mouth daily with breakfast.   fluticasone 50 MCG/ACT nasal spray Commonly known as: FLONASE Place 2 sprays into both nostrils daily.   insulin aspart 100 UNIT/ML FlexPen Commonly known as: NOVOLOG Inject 6 Units into the skin 3 (three) times daily with meals. Per sliding scale What changed:  how much to take additional instructions   insulin glargine 100 UNIT/ML injection Commonly known as: LANTUS Inject 0.58 mLs (58 Units total) into the skin 2 (two) times daily. What changed: how much to take  isosorbide mononitrate 60 MG 24 hr tablet Commonly known as: IMDUR Take 1 tablet (60 mg total) by mouth daily. What changed: how much to take   lamoTRIgine 25 MG tablet Commonly known as: LAMICTAL Take 1 tablet (25 mg total) by mouth daily.   liraglutide 18 MG/3ML Sopn Commonly known as: VICTOZA Inject 1.8 mg into the skin daily.   metFORMIN 1000 MG tablet Commonly known as: GLUCOPHAGE Take 1 tablet (1,000 mg total) by mouth 2 (two) times daily with a meal.   mupirocin ointment 2 % Commonly known as: BACTROBAN Place 1 application into the nose 2 (two) times daily. What changed: additional instructions   mupirocin ointment 2 % Commonly known as: BACTROBAN Apply topically 2 (two) times daily. What changed: Another medication with the same name was changed. Make sure you understand how and when to take each.   naloxone 4 MG/0.1ML Liqd nasal spray  kit Commonly known as: NARCAN Place 4 mg into the nose as needed (overdose).   nutrition supplement (JUVEN) Pack Take 1 packet by mouth 2 (two) times daily between meals.   oxyCODONE HCl 7.5 MG Tabs Take 7.5 mg by mouth every 6 (six) hours as needed for severe pain. What changed:  medication strength how much to take when to take this reasons to take this   pantoprazole 40 MG tablet Commonly known as: PROTONIX Take 1 tablet (40 mg total) by mouth daily. What changed: how much to take   pregabalin 150 MG capsule Commonly known as: LYRICA Take 2 capsules (300 mg total) by mouth in the morning.   pregabalin 150 MG capsule Commonly known as: LYRICA Take 1 capsule (150 mg total) by mouth at bedtime.   ranolazine 500 MG 12 hr tablet Commonly known as: RANEXA Take 1 tablet (500 mg total) by mouth 2 (two) times daily.   Vascepa 1 g capsule Generic drug: icosapent Ethyl Take 2 capsules (2 g total) by mouth 2 (two) times daily.               Discharge Care Instructions  (From admission, onward)           Start     Ordered   11/25/21 0000  Leave dressing on - Keep it clean, dry, and intact until clinic visit       Comments: Also keep negative pressure wound therapy on until can be reviewed by orthopedics Dr. Sharol Given   11/25/21 1012           Allergies  Allergen Reactions   Sulfa Antibiotics Other (See Comments)    Headaches     Contact information for follow-up providers     Newt Minion, MD Follow up in 1 week(s).   Specialty: Orthopedic Surgery Contact information: [place] [address] [phone]              Contact information for after-discharge care     [facility] .   Service: Skilled Nursing Contact information: [address] [place] [phone]                      The results of significant diagnostics from this  hospitalization (including imaging, microbiology, ancillary and laboratory) are listed below for reference.    Significant Diagnostic Studies: DG Chest 2 View  Result Date: 11/17/2021 CLINICAL DATA:  Chest pain. EXAM: CHEST - 2 VIEW COMPARISON:  Chest x-ray 08/15/2021. FINDINGS: Sternotomy wires  are again seen. The heart size and mediastinal contours are within normal limits. Both lungs are clear. The visualized skeletal structures are unremarkable. IMPRESSION: No active cardiopulmonary disease. Electronically Signed   By: Ronney Asters M.D.   On: 11/17/2021 22:29   MR FOOT LEFT WO CONTRAST  Result Date: 11/19/2021 CLINICAL DATA:  Foot swelling.  Evaluate for osteomyelitis. EXAM: MRI OF THE LEFT FOOT WITHOUT CONTRAST TECHNIQUE: Multiplanar, multisequence MR imaging of the left foot was performed. No intravenous contrast was administered. COMPARISON:  03/05/2021 FINDINGS: Patient motion degrades image quality limiting evaluation. Bones/Joint/Cartilage No acute fracture or dislocation. Prior partial amputation of the fourth metatarsal and phalanges. Large wound along the dorsal aspect of the forefoot. Bone marrow edema in the second distal phalanx and head of the second middle phalanx concerning for osteomyelitis. No other marrow signal abnormality. Normal alignment. No joint effusion. Ligaments Collateral ligaments are intact.  Lisfranc ligament is intact. Muscles and Tendons Generalized muscle atrophy. Flexor, extensor, peroneal tendons are intact. Soft tissue No fluid collection or hematoma. No soft tissue mass. Severe soft tissue edema along the dorsal aspect of the foot. Soft tissue wound along the plantar aspect of the foot at the level of the second and third metatarsal heads. Soft tissue edema of the second phalanx. Small amount of fluid in the first and second intermetatarsal bursa. IMPRESSION: 1. Soft tissue wound along the dorsal aspect of the forefoot at the level of the fourth metatarsal  amputation and plantar aspect of the forefoot at the level of the second and third metatarsal heads. 2. Bone marrow edema in the second distal phalanx and head of the second middle phalanx concerning for osteomyelitis. 3. Generalized soft tissue edema along the dorsal aspect of the foot extending into the second phalanx and to lesser extent along the plantar aspect of the foot most concerning for cellulitis which extends towards the ankle. Electronically Signed   By: Kathreen Devoid M.D.   On: 11/19/2021 07:03   DG Foot Complete Left  Result Date: 11/17/2021 CLINICAL DATA:  Concern for osteomyelitis. EXAM: LEFT FOOT - COMPLETE 3+ VIEW COMPARISON:  Left toe x-ray 10/21/2021 3 FINDINGS: There is no acute fracture or dislocation. There is been prior amputation of the distal second metatarsal and phalanx. Soft tissue ulceration is seen in this region. There is no cortical erosion or periosteal reaction. There is no acute fracture or dislocation. IMPRESSION: 1. Prior amputation of the second toe and distal second metatarsal with overlying soft tissue ulceration. 2. No acute bony abnormality. Electronically Signed   By: Ronney Asters M.D.   On: 11/17/2021 22:31   DG Foot Complete Right  Result Date: 11/17/2021 CLINICAL DATA:  Wound. EXAM: RIGHT FOOT COMPLETE - 3+ VIEW COMPARISON:  None. FINDINGS: There has been prior amputation of the first toe. There is soft tissue swelling of the forefoot. There is a linear radiopaque foreign body measuring 12 mm in the plantar soft tissues at the level of the fifth metatarsal. There is no evidence for fracture or dislocation. There is no cortical erosion or periosteal reaction. IMPRESSION: 1. Soft tissue swelling of the forefoot with plantar soft tissue foreign body. 2. Amputation of the first toe. 3. No acute bony abnormality. Electronically Signed   By: Ronney Asters M.D.   On: 11/17/2021 22:33   VAS [place] LOWER EXTREMITY VENOUS (DVT)  Result Date: 11/18/2021  Lower Venous  DVT Study Patient Name:  TURKI TAPANES [facility]  Date of Exam:   11/18/2021 Medical Rec #:  [id number]        Accession #:    [id number] Date of Birth: Aug 15, 1971        Patient Gender: M Patient Age:   67 years Exam [facility]:  [facility] Procedure:      VAS [place] LOWER EXTREMITY VENOUS (DVT) Referring Phys: Wandra Feinstein RATHORE --------------------------------------------------------------------------------  Indications: Swelling.  Risk Factors: None identified. Limitations: Body habitus and poor ultrasound/tissue interface. Comparison Study: No prior studies. Performing Technologist: Oliver Hum RVT  Examination Guidelines: A complete evaluation includes B-mode imaging, spectral Doppler, color Doppler, and power Doppler as needed of all accessible portions of each vessel. Bilateral testing is considered an integral part of a complete examination. Limited examinations for reoccurring indications may be performed as noted. The reflux portion of the exam is performed with the patient in reverse Trendelenburg.  +---------+---------------+---------+-----------+----------+--------------+ RIGHT    CompressibilityPhasicitySpontaneityPropertiesThrombus Aging +---------+---------------+---------+-----------+----------+--------------+ CFV      Full           Yes      Yes                                 +---------+---------------+---------+-----------+----------+--------------+ SFJ      Full                                                        +---------+---------------+---------+-----------+----------+--------------+ FV Prox  Full                                                        +---------+---------------+---------+-----------+----------+--------------+ FV Mid   Full                                                        +---------+---------------+---------+-----------+----------+--------------+ FV DistalFull                                                         +---------+---------------+---------+-----------+----------+--------------+ PFV      Full                                                        +---------+---------------+---------+-----------+----------+--------------+ POP      Full           Yes      Yes                                 +---------+---------------+---------+-----------+----------+--------------+ PTV      Full                                                        +---------+---------------+---------+-----------+----------+--------------+  PERO     Full                                                        +---------+---------------+---------+-----------+----------+--------------+   +---------+---------------+---------+-----------+----------+--------------+ LEFT     CompressibilityPhasicitySpontaneityPropertiesThrombus Aging +---------+---------------+---------+-----------+----------+--------------+ CFV      Full           Yes      Yes                                 +---------+---------------+---------+-----------+----------+--------------+ SFJ      Full                                                        +---------+---------------+---------+-----------+----------+--------------+ FV Prox  Full                                                        +---------+---------------+---------+-----------+----------+--------------+ FV Mid   Full                                                        +---------+---------------+---------+-----------+----------+--------------+ FV DistalFull                                                        +---------+---------------+---------+-----------+----------+--------------+ PFV      Full                                                        +---------+---------------+---------+-----------+----------+--------------+ POP      Full           Yes      Yes                                  +---------+---------------+---------+-----------+----------+--------------+ PTV      Full                                                        +---------+---------------+---------+-----------+----------+--------------+ PERO     Full                                                        +---------+---------------+---------+-----------+----------+--------------+  Summary: RIGHT: - There is no evidence of deep vein thrombosis in the lower extremity. However, portions of this examination were limited- see technologist comments above.  - No cystic structure found in the popliteal fossa.  LEFT: - There is no evidence of deep vein thrombosis in the lower extremity. However, portions of this examination were limited- see technologist comments above.  - No cystic structure found in the popliteal fossa.  *See table(s) above for measurements and observations. Electronically signed by Jamelle Haring on 11/18/2021 at 4:01:15 PM.    Final     Microbiology: Recent Results (from the past 240 hour(s))  Blood culture (routine x 2)     Status: None   Collection Time: 11/17/21  9:03 PM   Specimen: BLOOD  Result Value Ref Range Status   Specimen Description BLOOD LEFT ANTECUBITAL  Final   Special Requests   Final    BOTTLES DRAWN AEROBIC AND ANAEROBIC Blood Culture adequate volume   Culture   Final    NO GROWTH 5 DAYS Performed at [facility], [address], [place], [place] [address]    Report Status 11/22/2021 FINAL  Final  Blood culture (routine x 2)     Status: None   Collection Time: 11/17/21  9:03 PM   Specimen: BLOOD RIGHT HAND  Result Value Ref Range Status   Specimen Description BLOOD RIGHT HAND  Final   Special Requests   Final    AEROBIC BOTTLE ONLY Blood Culture results may not be optimal due to an inadequate volume of blood received in culture bottles   Culture   Final    NO GROWTH 5 DAYS Performed at [facility], [place] [address] [place], [place] [address]     Report Status 11/22/2021 FINAL  Final  Resp Panel by RT-PCR (Flu A&B, Covid) Nasopharyngeal Swab     Status: None   Collection Time: 11/17/21  9:45 PM   Specimen: Nasopharyngeal Swab; Nasopharyngeal(NP) swabs in vial transport medium  Result Value Ref Range Status   SARS Coronavirus 2 by RT PCR NEGATIVE NEGATIVE Final    Comment: (NOTE) SARS-CoV-2 target nucleic acids are NOT DETECTED.  The SARS-CoV-2 RNA is generally detectable in upper respiratory specimens during the acute phase of infection. The lowest concentration of SARS-CoV-2 viral copies this assay can detect is 138 copies/mL. A negative result does not preclude SARS-Cov-2 infection and should not be used as the sole basis for treatment or other patient management decisions. A negative result may occur with  improper specimen collection/handling, submission of specimen other than nasopharyngeal swab, presence of viral mutation(s) within the areas targeted by this assay, and inadequate number of viral copies(<138 copies/mL). A negative result must be combined with clinical observations, patient history, and epidemiological information. The expected result is Negative.  Fact Sheet for Patients:  EntrepreneurPulse.com.au  Fact Sheet for Healthcare Providers:  IncredibleEmployment.be  This test is no t yet approved or cleared by the [place] FDA and  has been authorized for detection and/or diagnosis of SARS-CoV-2 by FDA under an Emergency Use Authorization (EUA). This EUA will remain  in effect (meaning this test can be used) for the duration of the COVID-19 declaration under Section 564(b)(1) of the Act, 21 U.S.C.section 360bbb-3(b)(1), unless the authorization is terminated  or revoked sooner.       Influenza A by PCR NEGATIVE NEGATIVE Final   Influenza B by PCR NEGATIVE NEGATIVE Final    Comment: (NOTE) The Xpert Xpress SARS-CoV-2/FLU/RSV plus assay  is intended as an aid in  the diagnosis of influenza from Nasopharyngeal swab specimens and should not be used as a sole basis for treatment. Nasal washings and aspirates are unacceptable for Xpert Xpress SARS-CoV-2/FLU/RSV testing.  Fact Sheet for Patients: EntrepreneurPulse.com.au  Fact Sheet for Healthcare Providers: IncredibleEmployment.be  This test is not yet approved or cleared by the [place] FDA and has been authorized for detection and/or diagnosis of SARS-CoV-2 by FDA under an Emergency Use Authorization (EUA). This EUA will remain in effect (meaning this test can be used) for the duration of the COVID-19 declaration under Section 564(b)(1) of the Act, 21 U.S.C. section 360bbb-3(b)(1), unless the authorization is terminated or revoked.  Performed at [facility], [place] [address], [place], [place] [address]   Surgical pcr screen     Status: Abnormal   Collection Time: 11/19/21  7:21 AM   Specimen: Nasal Mucosa; Nasal Swab  Result Value Ref Range Status   MRSA, PCR NEGATIVE NEGATIVE Final   Staphylococcus aureus POSITIVE (A) NEGATIVE Final    Comment: (NOTE) The Xpert SA Assay (FDA approved for NASAL specimens in patients 15 years of age and older), is one component of a comprehensive surveillance program. It is not intended to diagnose infection nor to guide or monitor treatment. Performed at [facility], [place] [address], [place], [place] [address]   Resp Panel by RT-PCR (Flu A&B, Covid) Nasopharyngeal Swab     Status: None   Collection Time: 11/25/21 11:00 AM   Specimen: Nasopharyngeal Swab; Nasopharyngeal(NP) swabs in vial transport medium  Result Value Ref Range Status   SARS Coronavirus 2 by RT PCR NEGATIVE NEGATIVE Final    Comment: (NOTE) SARS-CoV-2 target nucleic acids are NOT DETECTED.  The SARS-CoV-2 RNA is generally detectable in upper respiratory specimens during the acute phase of infection. The lowest concentration of  SARS-CoV-2 viral copies this assay can detect is 138 copies/mL. A negative result does not preclude SARS-Cov-2 infection and should not be used as the sole basis for treatment or other patient management decisions. A negative result may occur with  improper specimen collection/handling, submission of specimen other than nasopharyngeal swab, presence of viral mutation(s) within the areas targeted by this assay, and inadequate number of viral copies(<138 copies/mL). A negative result must be combined with clinical observations, patient history, and epidemiological information. The expected result is Negative.  Fact Sheet for Patients:  EntrepreneurPulse.com.au  Fact Sheet for Healthcare Providers:  IncredibleEmployment.be  This test is no t yet approved or cleared by the [place] FDA and  has been authorized for detection and/or diagnosis of SARS-CoV-2 by FDA under an Emergency Use Authorization (EUA). This EUA will remain  in effect (meaning this test can be used) for the duration of the COVID-19 declaration under Section 564(b)(1) of the Act, 21 U.S.C.section 360bbb-3(b)(1), unless the authorization is terminated  or revoked sooner.       Influenza A by PCR NEGATIVE NEGATIVE Final   Influenza B by PCR NEGATIVE NEGATIVE Final    Comment: (NOTE) The Xpert Xpress SARS-CoV-2/FLU/RSV plus assay is intended as an aid in the diagnosis of influenza from Nasopharyngeal swab specimens and should not be used as a sole basis for treatment. Nasal washings and aspirates are unacceptable for Xpert Xpress SARS-CoV-2/FLU/RSV testing.  Fact Sheet for Patients: EntrepreneurPulse.com.au  Fact Sheet for Healthcare Providers: IncredibleEmployment.be  This test is not yet approved or cleared by the [place] FDA and has been authorized for detection and/or diagnosis  of SARS-CoV-2 by FDA under an Emergency Use  Authorization (EUA). This EUA will remain in effect (meaning this test can be used) for the duration of the COVID-19 declaration under Section 564(b)(1) of the Act, 21 U.S.C. section 360bbb-3(b)(1), unless the authorization is terminated or revoked.  Performed at [facility], [place] [address], [place], [place] [address]      Labs: Basic Metabolic Panel: Recent Labs  Lab 11/20/21 0122 11/21/21 0832 11/23/21 0736 11/25/21 0838  NA 133* 134* 133* 136  K 5.4* 4.8 4.6 4.1  CL 100 102 102 105  CO2 _0 GLUCOSE 280* 330* 296* 183*  BUN 21* 22* 27* 24*  CREATININE 1.72* 1.46* 1.41* 1.20  CALCIUM 8.5* 8.2* 8.7* 8.0*  PHOS  --   --   --  4.6    Liver Function Tests: Recent Labs  Lab 11/25/21 0838  ALBUMIN 1.8*    No results for input(s): LIPASE, AMYLASE in the last 168 hours. No results for input(s): AMMONIA in the last 168 hours. CBC: Recent Labs  Lab 11/20/21 0122 11/21/21 0249 11/23/21 0736  WBC 14.6* 10.4 10.5  HGB 11.6* 9.9* 10.0*  HCT 36.8* 31.6* 31.9*  MCV 81.2 80.8 81.2  PLT 325 281 387    Cardiac Enzymes: No results for input(s): CKTOTAL, CKMB, CKMBINDEX, TROPONINI in the last 168 hours. BNP: BNP (last 3 results) No results for input(s): BNP in the last 8760 hours.  ProBNP (last 3 results) No results for input(s): PROBNP in the last 8760 hours.  CBG: Recent Labs  Lab 11/25/21 1122 11/25/21 1634 11/25/21 2131 11/26/21 0526 11/26/21 0634  GLUCAP 173* 180* 125* 213* 187*        Signed:  Nita Sells MD   [facility] 11/26/2021, 10:23 AM

## 2021-11-26 NOTE — Progress Notes (Signed)
OT Cancellation Note  Patient Details Name: MAY OZMENT MRN: [id number] DOB: [birth year]   Cancelled Treatment:    Reason Eval/Treat Not Completed: Patient declined, no reason specified (patient states he is being discharge to SNF and had walked enough today.) Alfonse Flavors, OTA Acute Rehabilitation Services  Pager [phone] Office [phone]  Dewain Penning 11/26/2021, 11:13 AM

## 2021-11-27 NOTE — Telephone Encounter (Signed)
I called pt first to see if we can get him on the schedule for post op either Monday or Tuesday next week. He did not answer, lmtcb. Will f/u with [facility] in regards of treatment. We need to see pt first before we can decide on treatment plan.

## 2021-11-30 NOTE — Telephone Encounter (Signed)
Pt did move his appt with Erin on 12/01/21. Will keep open pending wound care orders.

## 2021-12-01 ENCOUNTER — Telehealth: Payer: Self-pay | Admitting: Orthopedic Surgery

## 2021-12-01 ENCOUNTER — Encounter: Payer: Self-pay | Admitting: Family

## 2021-12-01 ENCOUNTER — Ambulatory Visit (INDEPENDENT_AMBULATORY_CARE_PROVIDER_SITE_OTHER): Payer: Medicare HMO | Admitting: Family

## 2021-12-01 DIAGNOSIS — Z89432 Acquired absence of left foot: Secondary | ICD-10-CM

## 2021-12-01 DIAGNOSIS — E1151 Type 2 diabetes mellitus with diabetic peripheral angiopathy without gangrene: Secondary | ICD-10-CM

## 2021-12-01 DIAGNOSIS — L97511 Non-pressure chronic ulcer of other part of right foot limited to breakdown of skin: Secondary | ICD-10-CM

## 2021-12-01 MED ORDER — DOXYCYCLINE HYCLATE 100 MG PO TABS: 100.0000 mg | ORAL_TABLET | Freq: Two times a day (BID) | ORAL | 0 refills | Status: DC

## 2021-12-01 NOTE — Telephone Encounter (Signed)
Pt was seen in office today and sent back with orders with doxycycline, idosorb dressings to ulcer of right foot. Daily dial cleansing and dry dressing changes to surgical incision.

## 2021-12-01 NOTE — Telephone Encounter (Signed)
Jamesetta Orleans from [facility] called requesting pt prescription of antibiotics be faxed to them for they have an in-house pharmacy. Please fax to [phone]. Also Urban Gibson is asking for orders for wound care of lower and upper extremities and 2 area ulcers. Jamesetta Orleans states on discharge it was unclear of wound care orders. Jamesetta Orleans is asking for a call back at [phone]. Jamesetta Orleans states she leaves at 1:30 pm today but if she is gone to ask for [employer]) and they will page her. Please call Ericka at [phone]

## 2021-12-01 NOTE — Telephone Encounter (Signed)
Please see below. What did you send with the pt for orders today?

## 2021-12-01 NOTE — Progress Notes (Signed)
Post-Op Visit Note   Patient: Joseph [name]           Date of Birth: Dec 20, 1971           MRN: [id number] Visit Date: 12/01/2021 PCP: Marva Panda, NP  Chief Complaint:  Chief Complaint  Patient presents with   Left Foot - Routine Post Op    11/20/21 left foot transmet amputation    Right Leg - Pain    HPI:  HPI The patient is a 50 year old gentleman who is seen today status post left foot transmetatarsal amputation December 2 of this year.  He is currently residing at skilled nursing.  Unfortunately he is also concerned for new ulceration on the lateral column of his right foot.  Denies fevers or chills  Ortho Exam On examination of the left foot the transmetatarsal amputation is well approximated sutures there is no gaping.  There is scant serous drainage there is surrounding erythema edema up to the midfoot.  He also has some mild erythema to the lower leg with dry flaking skin no warmth On examination of the right foot the great toe is surgically absent he does have fissures beneath the third and fourth toes there is no active drainage no erythema over the lateral column there is a nickel sized ulcer with 100% fibrinous exudative tissue there is surrounding dry peeling skin no erythema no warmth  Visit Diagnoses:  1. DM (diabetes mellitus) with peripheral vascular complication (HCC)   2. 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 Right foot ulcer, limited to breakdown of skin (HCC)     Plan: New ulcer to the right foot which is concerning.  Cellulitis to the surgical foot we will place him on doxycycline and follow-up in 1 week.  Iodosorb dressings to the ulcers on the right foot.  Begin daily Dial soap cleansing and dry dressing changes . to the surgical incision.  Continue nonweightbearing on the left  Follow-Up Instructions: Return in about 1 week (around 12/08/2021).   Imaging: No results found.  Orders:  No orders of the defined types were  placed in this encounter.  Meds ordered this encounter  Medications   doxycycline (VIBRA-TABS) 100 MG tablet    Sig: Take 1 tablet (100 mg total) by mouth 2 (two) times daily.    Dispense:  60 tablet    Refill:  0     PMFS History: Patient Active Problem List   Diagnosis Date Noted   Bipolar disorder with depression (HCC)    Severe sepsis (HCC) 11/18/2021   Chest pain 11/18/2021   Dyspnea 11/18/2021   Wound infection    Ulcer of left foot with necrosis of bone (HCC)    Bicuspid aortic valve 09/14/2021   Aneurysm of ascending aorta 09/14/2021   Hyperkalemia 08/20/2021   DM (diabetes mellitus) with peripheral vascular complication (HCC) 08/20/2021   Atypical chest pain 07/17/2021   Hyponatremia 07/17/2021   Leg wound, left 07/17/2021   Essential hypertension 07/17/2021   Acute hyperglycemia 07/09/2021   Left leg cellulitis 04/11/2021   Cellulitis of left leg 03/05/2021   Cellulitis 02/18/2021   PAD (peripheral artery disease) (HCC) 10/10/2020   Osteomyelitis of fourth toe of right foot (HCC)    S/P CABG x 4 07/24/2020   Non-ST elevation (NSTEMI) myocardial infarction (HCC) 07/22/2020   Acute on chronic diastolic heart failure (HCC) 07/22/2020   Hypotension 07/18/2020   Left leg swelling 07/18/2020   Elevated troponin 07/18/2020   AKI (acute  kidney injury) (HCC) 07/18/2020   Elevated brain natriuretic peptide (BNP) level 07/18/2020   GERD (gastroesophageal reflux disease) 07/18/2020   Unstable angina (HCC) 11/01/2019   Edema 06/13/2019   Leukocytosis 05/31/2019   Smoker 03/08/2019   Chronic pain 03/08/2019   Chronic back pain 02/16/2019   Deep venous thrombosis (HCC) 02/16/2019   Obesity, Class III, BMI 40-49.9 (morbid obesity) (HCC) 02/16/2019   Peripheral nerve disease 02/16/2019   COPD (chronic obstructive pulmonary disease) (HCC) 01/22/2019   Anxiety disorder 03/26/2018   Open wound of toe 10/12/2017   Sinusitis 10/12/2017   Abdominal pain    Loss of weight     Diabetic foot infection (HCC) 09/16/2017   Uncontrolled type 2 diabetes mellitus with hyperglycemia, with long-term current use of insulin (HCC) 09/16/2017   Cellulitis of right foot    Chronic ulcer of great toe of right foot (HCC) 09/09/2017   Recurrent chest pain 07/29/2016   Hyperglycemia    Insulin dependent type 2 diabetes mellitus (HCC) 04/29/2015   OSA (obstructive sleep apnea) 05/08/2013   History of pulmonary embolus (PE) 04/27/2013   CAD -S/P PCI 11/02/19  09/10/2008   Hyperlipidemia 09/09/2008   Past Medical History:  Diagnosis Date   Aneurysm of ascending aorta 09/14/2021   Anxiety    Arthritis    "knees, shoulders, hips, ankles" (07/29/2016)   Asthma    Bicuspid aortic valve 09/14/2021   CAD (coronary artery disease)    a. 2017: s/p BMS to distal Cx; b. LHC 05/30/2019: 80% mid, distal RCA s/p DES, 30% narrowing of d LM, widely patent LAD w/ luminal irregularities, widely patent stent in dCX  w/ 90+% stenosis distal to stent beofre small trifurcating obtuse marginal (potentially area of restenosis)   Chronic bronchitis (HCC)    Chronic ulcer of right great toe (HCC) 09/19/2017   COPD (chronic obstructive pulmonary disease) (HCC)    Depression    DVT (deep venous thrombosis) (HCC)    GERD (gastroesophageal reflux disease)    Hyperlipidemia    Hypertension    Myocardial infarction (HCC) 1996   "light one"   PE (pulmonary embolism) 04/2013   On chronic Xarelto   Peripheral nerve disease    Pneumonia "several times"   Sleep apnea    Type II diabetes mellitus (HCC)     Family History  Problem Relation Age of Onset   Hypertension Brother    Hypertension Father    Diabetes Other    Hyperlipidemia Other     Past Surgical History:  Procedure Laterality Date   ABDOMINAL AORTOGRAM W/LOWER EXTREMITY N/A 10/10/2020   Procedure: ABDOMINAL AORTOGRAM W/LOWER EXTREMITY;  Surgeon: Sherren Kerns, MD;  Location: [facility] IN[facility]IVE [facility] LAB;  Service: Cardiovascular;  Laterality:  N/A;   AMPUTATION Right 09/21/2017   Procedure: RIGHT GREAT TOE AMPUTATION, POSSIBLE VAC;  Surgeon: Tarry Kos, MD;  Location: [facility] OR;  Service: Orthopedics;  Laterality: Right;   AMPUTATION Left 08/13/2020   Procedure: LEFT FOOT 4TH RAY AMPUTATION;  Surgeon: [name], MD;  Location: [facility] OR;  Service: Orthopedics;  Laterality: Left;   AMPUTATION Left 11/20/2021   Procedure: LEFT TRANSMETATARSAL AMPUTATION;  Surgeon: [name], MD;  Location[facility] OR;  Service: Orthopedics;  Laterality: Left;   AORTOGRAM Bilateral 03/13/2021   Procedure: ABDOMINAL AORTOGRAM WITH Left LOWER EXTREMITY RUNOFF;  Surgeon: Leonie Douglas, MD;  Location: [facility] OR;  Service: Vascular;  Laterality: Bilateral;   CARDIAC CATHETERIZATION  2006   CARDIAC CATHETERIZATION  1996   "@  [facility]; when I had my heart attack"   CARDIAC CATHETERIZATION N/A 07/29/2016   Procedure: Left Heart Cath and Coronary Angiography;  Surgeon: Runell Gess, MD;  Location: [facility] INVASIVE CV LAB;  Service: Cardiovascular;  Laterality: N/A;   CARDIAC CATHETERIZATION N/A 07/29/2016   Procedure: Coronary Stent Intervention;  Surgeon: Runell Gess, MD;  Location: [facility];  Service: Cardiovascular;  Laterality: N/A;   CARPAL TUNNEL RELEASE Bilateral    CORONARY ANGIOPLASTY WITH STENT PLACEMENT  07/29/2016   CORONARY ARTERY BYPASS GRAFT N/A 07/24/2020   Procedure: CORONARY ARTERY BYPASS GRAFTING (CABG), ON PUMP, TIMES FOUR, USING LEFT INTERNAL MAMMARY ARTERY AND ENDOSCOPICALLY HARVESTED RIGHT GREATER SAPHENOUS VEIN;  Surgeon: Corliss Skains, MD;  Location: [facility] OR;  Service: Open Heart Surgery;  Laterality: N/A;  FLOW TAC   CORONARY STENT INTERVENTION N/A 05/30/2019   Procedure: CORONARY STENT INTERVENTION;  Surgeon: Lyn Records, MD;  Location: [facility];  Service: Cardiovascular;  Laterality: N/A;   CORONARY STENT INTERVENTION N/A 11/02/2019   Procedure: CORONARY STENT INTERVENTION;  Surgeon: Iran Ouch, MD;   Location: [facility];  Service: Cardiovascular;  Laterality: N/A;   ESOPHAGOGASTRODUODENOSCOPY N/A 09/22/2017   Procedure: ESOPHAGOGASTRODUODENOSCOPY (EGD);  Surgeon: Meryl Dare, MD;  Location: [facility] ENDOSCOPY;  Service: Endoscopy;  Laterality: N/A;   INTRAVASCULAR PRESSURE WIRE/FFR STUDY N/A 07/22/2020   Procedure: INTRAVASCULAR PRESSURE WIRE/FFR STUDY;  Surgeon: Marykay Lex, MD;  Location: [facility] INVASIVE CV LAB;  Service: Cardiovascular;  Laterality: N/A;   KNEE ARTHROSCOPY Bilateral    "2 on left; 1 on the right"   LEFT HEART CATH AND CORONARY ANGIOGRAPHY N/A 11/02/2019   Procedure: LEFT HEART CATH AND CORONARY ANGIOGRAPHY;  Surgeon: Iran Ouch, MD;  Location: [facility];  Service: Cardiovascular;  Laterality: N/A;   LEFT HEART CATH AND CORONARY ANGIOGRAPHY N/A 07/22/2020   Procedure: LEFT HEART CATH AND CORONARY ANGIOGRAPHY;  Surgeon: Marykay Lex, MD;  Location: [facility] INVASIVE CV LAB;  Service: Cardiovascular;  Laterality: N/A;   LEFT HEART CATH AND CORS/GRAFTS ANGIOGRAPHY N/A 08/17/2021   Procedure: LEFT HEART CATH AND CORS/GRAFTS ANGIOGRAPHY;  Surgeon: Yvonne Kendall, MD;  Location: [facility];  Service: Cardiovascular;  Laterality: N/A;   PERIPHERAL VASCULAR INTERVENTION Left 10/11/2020   popliteal and SFA stent placement    PERIPHERAL VASCULAR INTERVENTION Left 10/10/2020   Procedure: PERIPHERAL VASCULAR INTERVENTION;  Surgeon: Sherren Kerns, MD;  Location: [facility];  Service: Cardiovascular;  Laterality: Left;   RIGHT/LEFT HEART CATH AND CORONARY ANGIOGRAPHY N/A 05/30/2019   Procedure: RIGHT/LEFT HEART CATH AND CORONARY ANGIOGRAPHY;  Surgeon: Lyn Records, MD;  Location: [facility];  Service: Cardiovascular;  Laterality: N/A;   SHOULDER OPEN ROTATOR CUFF REPAIR Bilateral    TEE WITHOUT CARDIOVERSION N/A 07/24/2020   Procedure: TRANSESOPHAGEAL ECHOCARDIOGRAM (TEE);  Surgeon: Corliss Skains, MD;  Location: [facility] OR;  Service: Open  Heart Surgery;  Laterality: N/A;   Social History   Occupational History   Occupation: disabled     Associate Professor: UNEMPLOYED  Tobacco Use   Smoking status: Former    Packs/day: 1.00    Years: 34.00    Pack years: 34.00    Types: Cigarettes    Quit date: 07/12/2020    Years since quitting: 1.3   Smokeless tobacco: Former    Types: Snuff, Chew  Vaping Use   Vaping Use: Never used  Substance and Sexual Activity   Alcohol use: Yes  Comment: RARE   Drug use: No   Sexual activity: Yes

## 2021-12-02 ENCOUNTER — Other Ambulatory Visit: Payer: Self-pay

## 2021-12-02 ENCOUNTER — Encounter: Payer: Self-pay | Admitting: Family

## 2021-12-02 MED ORDER — DOXYCYCLINE HYCLATE 100 MG PO TABS: 100.0000 mg | ORAL_TABLET | Freq: Two times a day (BID) | ORAL | 0 refills | Status: DC

## 2021-12-02 NOTE — Telephone Encounter (Signed)
Dry dressings to the operative foot, Silvadene dressings to the wounds on the right foot.  PT for upper body strengthening.  No weightbearing lower extremities

## 2021-12-02 NOTE — Telephone Encounter (Signed)
Orders below and rx for ABX faxed to number provided and advised to call with questions.

## 2021-12-03 ENCOUNTER — Telehealth: Payer: Self-pay | Admitting: Orthopedic Surgery

## 2021-12-03 NOTE — Telephone Encounter (Signed)
Erica requests a call back regarding treatment prescription that was faxed over for patient. Please follow up

## 2021-12-04 NOTE — Telephone Encounter (Signed)
I called lm on vm gave verbal orders for wound care ( these were faxed on 12/02/21) to advise dry dressing to the left foot non weight bearing on the left and Silvadene dressing to the wounds on the right foot change daily. To call with specific questions so that I can call her back with answers.

## 2021-12-07 ENCOUNTER — Telehealth: Payer: Self-pay | Admitting: Orthopedic Surgery

## 2021-12-07 NOTE — Telephone Encounter (Signed)
I called and sw pt's wife and she is afraid foot is infected and wants to be evaluated. I called [facility] [phone] and was on hold for 13 minutes. I hung up and called back to explain calling from the doctors office for a pt that needs eval today and was transferred to medical records. I called the pt's wife back and asked her to call and let them know I am trying to reach them about an appt and to let me know if they can arrange transportation today or tomorrow. Will hold message and await return call.

## 2021-12-07 NOTE — Telephone Encounter (Signed)
Pt wife called and states her husband was put in a nursing rehab facility. She says they are trying to discharge him today but she thinks he shouldn't leave yet. She thinks his leg that was just amputated on 12/09 looks necrotic.   CB [phone]

## 2021-12-07 NOTE — Telephone Encounter (Signed)
Appt tomorrow at 2:45

## 2021-12-08 ENCOUNTER — Ambulatory Visit: Payer: Medicare HMO | Admitting: Orthopedic Surgery

## 2021-12-10 ENCOUNTER — Ambulatory Visit (INDEPENDENT_AMBULATORY_CARE_PROVIDER_SITE_OTHER): Payer: Medicare HMO | Admitting: Orthopedic Surgery

## 2021-12-10 ENCOUNTER — Encounter: Payer: Self-pay | Admitting: Orthopedic Surgery

## 2021-12-10 ENCOUNTER — Other Ambulatory Visit: Payer: Self-pay

## 2021-12-10 DIAGNOSIS — I87332 Chronic venous hypertension (idiopathic) with ulcer and inflammation of left lower extremity: Secondary | ICD-10-CM

## 2021-12-10 DIAGNOSIS — L97929 Non-pressure chronic ulcer of unspecified part of left lower leg with unspecified severity: Secondary | ICD-10-CM

## 2021-12-10 DIAGNOSIS — Z89432 Acquired absence of left foot: Secondary | ICD-10-CM

## 2021-12-10 DIAGNOSIS — E1151 Type 2 diabetes mellitus with diabetic peripheral angiopathy without gangrene: Secondary | ICD-10-CM

## 2021-12-10 NOTE — Progress Notes (Signed)
Office Visit Note   Patient: Joseph [name]           Date of Birth: June 26, 1971           MRN: [id number] Visit Date: 12/10/2021              Requested by: [name], NP [address] [place],  [place] [address] PCP: [name], NP  Chief Complaint  Patient presents with   Left Foot - Routine Post Op    S/p Left TMA 11/20/21 Was put on doxy due to cellulitis at last OV   Right Foot - Wound Check, Follow-up    Ulcer on right foot      HPI: Patient is a 49 year old gentleman currently in skilled nursing status post left transmetatarsal amputation.  Patient states he has not been elevating his legs he has started doxycycline.  He states there is dehiscence of the incision on the left foot and venous ulceration on the right foot.  Assessment & Plan: Visit Diagnoses:  1. DM (diabetes mellitus) with peripheral vascular complication (HCC)   2. Partial nontraumatic amputation of left foot ([place])   3. Chronic venous hypertension (idiopathic) with ulcer and inflammation of left lower extremity (HCC)     Plan: Discussed with the patient he must keep his feet level with his heart at all times.  Recommend exercise to help with the muscle pump to help decrease the swelling as well as using compression.  Discussed the importance of nonweightbearing on the left foot.  Follow-Up Instructions: Return in about 2 weeks (around 12/24/2021).   Ortho Exam  Patient is alert, oriented, no adenopathy, well-dressed, normal affect, normal respiratory effort. Examination patient's calf circumference is 55 cm on the right 53 cm in the left there is venous stasis swelling over the dorsum of the right ankle with clear drainage.  There is cellulitis and wound dehiscence of the left transmetatarsal amputation.  Recommended continuing the doxycycline Dial soap cleansing daily and elevation.  Recommended therapy to help with the muscle pump.  Imaging: No results found. No images are  attached to the encounter.  Labs: Lab Results  Component Value Date   HGBA1C 10.0 (H) 11/18/2021   HGBA1C 11.8 (H) 08/15/2021   HGBA1C 13.7 (H) 07/10/2021   ESRSEDRATE 45 (H) 11/18/2021   ESRSEDRATE 41 (H) 09/16/2017   CRP 12.1 (H) 11/18/2021   CRP 8.9 (H) 09/16/2017   REPTSTATUS 11/22/2021 FINAL 11/17/2021   REPTSTATUS 11/22/2021 FINAL 11/17/2021   CULT  11/17/2021    NO GROWTH 5 DAYS Performed at [facility], [place] [address], [place], [place] [address]    CULT  11/17/2021    NO GROWTH 5 DAYS Performed at [facility] [address], [place], [place] [address]      Lab Results  Component Value Date   ALBUMIN 1.8 (L) 11/25/2021   ALBUMIN 2.6 (L) 11/17/2021   ALBUMIN 3.1 (L) 07/17/2021    Lab Results  Component Value Date   MG 1.6 (L) 07/17/2021   MG 1.5 (L) 07/10/2021   MG 1.6 (L) 04/14/2021   No results found for: VD25OH  No results found for: PREALBUMIN CBC EXTENDED Latest Ref Rng & Units 11/23/2021 11/21/2021 11/20/2021  WBC 4.0 - 10.5 K/uL 10.5 10.4 14.6(H)  RBC 4.22 - 5.81 MIL/uL 3.93(L) 3.91(L) 4.53  HGB 13.0 - 17.0 g/dL 10.0(L) 9.9(L) 11.6(L)  HCT 39.0 - 52.0 % 31.9(L) 31.6(L) 36.8(L)  PLT 150 - 400 K/uL 387 281  325  NEUTROABS 1.7 - 7.7 K/uL - - -  LYMPHSABS 0.7 - 4.0 K/uL - - -     There is no height or weight on file to calculate BMI.  Orders:  No orders of the defined types were placed in this encounter.  No orders of the defined types were placed in this encounter.    Procedures: No procedures performed  Clinical Data: No additional findings.  ROS:  All other systems negative, except as noted in the HPI. Review of Systems  Objective: Vital Signs: There were no vitals taken for this visit.  Specialty Comments:  No specialty comments available.  PMFS History: Patient Active Problem List   Diagnosis Date Noted   Bipolar disorder with depression (HCC)    Severe sepsis (HCC) 11/18/2021   Chest pain 11/18/2021   Dyspnea  11/18/2021   Wound infection    Ulcer of left foot with necrosis of bone (HCC)    Bicuspid aortic valve 09/14/2021   Aneurysm of ascending aorta 09/14/2021   Hyperkalemia 08/20/2021   DM (diabetes mellitus) with peripheral vascular complication (HCC) 08/20/2021   Atypical chest pain 07/17/2021   Hyponatremia 07/17/2021   Leg wound, left 07/17/2021   Essential hypertension 07/17/2021   Acute hyperglycemia 07/09/2021   Left leg cellulitis 04/11/2021   Cellulitis of left leg 03/05/2021   Cellulitis 02/18/2021   PAD (peripheral artery disease) (HCC) 10/10/2020   Osteomyelitis of fourth toe of right foot (HCC)    S/P CABG x 4 07/24/2020   Non-ST elevation (NSTEMI) myocardial infarction (HCC) 07/22/2020   Acute on chronic diastolic heart failure (HCC) 07/22/2020   Hypotension 07/18/2020   Left leg swelling 07/18/2020   Elevated troponin 07/18/2020   AKI (acute kidney injury) (HCC) 07/18/2020   Elevated brain natriuretic peptide (BNP) level 07/18/2020   GERD (gastroesophageal reflux disease) 07/18/2020   Unstable angina (HCC) 11/01/2019   Edema 06/13/2019   Leukocytosis 05/31/2019   Smoker 03/08/2019   Chronic pain 03/08/2019   Chronic back pain 02/16/2019   Deep venous thrombosis (HCC) 02/16/2019   Obesity, Class III, BMI 40-49.9 (morbid obesity) (HCC) 02/16/2019   Peripheral nerve disease 02/16/2019   COPD (chronic obstructive pulmonary disease) (HCC) 01/22/2019   Anxiety disorder 03/26/2018   Open wound of toe 10/12/2017   Sinusitis 10/12/2017   Abdominal pain    Loss of weight    Diabetic foot infection (HCC) 09/16/2017   Uncontrolled type 2 diabetes mellitus with hyperglycemia, with long-term current use of insulin (HCC) 09/16/2017   Cellulitis of right foot    Chronic ulcer of great toe of right foot (HCC) 09/09/2017   Recurrent chest pain 07/29/2016   Hyperglycemia    Insulin dependent type 2 diabetes mellitus (HCC) 04/29/2015   OSA (obstructive sleep apnea) 05/08/2013    History of pulmonary embolus (PE) 04/27/2013   CAD -S/P PCI 11/02/19  09/10/2008   Hyperlipidemia 09/09/2008   Past Medical History:  Diagnosis Date   Aneurysm of ascending aorta 09/14/2021   Anxiety    Arthritis    "knees, shoulders, hips, ankles" (07/29/2016)   Asthma    Bicuspid aortic valve 09/14/2021   CAD (coronary artery disease)    a. 2017: s/p BMS to distal Cx; b. LHC 05/30/2019: 80% mid, distal RCA s/p DES, 30% narrowing of d LM, widely patent LAD w/ luminal irregularities, widely patent stent in dCX  w/ 90+% stenosis distal to stent beofre small trifurcating obtuse marginal (potentially area of restenosis)   Chronic bronchitis (HCC)  Chronic ulcer of right great toe ([place]) 09/19/2017   COPD (chronic obstructive pulmonary disease) (HCC)    Depression    DVT (deep venous thrombosis) (HCC)    GERD (gastroesophageal reflux disease)    Hyperlipidemia    Hypertension    Myocardial infarction ([place]) 1996   "light one"   PE (pulmonary embolism) 04/2013   On chronic Xarelto   Peripheral nerve disease    Pneumonia "several times"   Sleep apnea    Type II diabetes mellitus ([place])     Family History  Problem Relation Age of Onset   Hypertension Brother    Hypertension Father    Diabetes Other    Hyperlipidemia Other     Past Surgical History:  Procedure Laterality Date   ABDOMINAL AORTOGRAM W/LOWER EXTREMITY N/A 10/10/2020   Procedure: ABDOMINAL AORTOGRAM W/LOWER EXTREMITY;  Surgeon: Elam Dutch, MD;  Location: [facility];  Service: Cardiovascular;  Laterality: N/A;   AMPUTATION Right 09/21/2017   Procedure: RIGHT GREAT TOE AMPUTATION, POSSIBLE VAC;  Surgeon: Leandrew Koyanagi, MD;  Location: [facility];  Service: Orthopedics;  Laterality: Right;   AMPUTATION Left 08/13/2020   Procedure: LEFT FOOT 4TH RAY AMPUTATION;  Surgeon: Newt Minion, MD;  Location: [facility];  Service: Orthopedics;  Laterality: Left;   AMPUTATION Left 11/20/2021   Procedure: LEFT TRANSMETATARSAL  AMPUTATION;  Surgeon: Newt Minion, MD;  Location: [facility];  Service: Orthopedics;  Laterality: Left;   AORTOGRAM Bilateral 03/13/2021   Procedure: ABDOMINAL AORTOGRAM WITH Left LOWER EXTREMITY RUNOFF;  Surgeon: Cherre Robins, MD;  Location: [facility];  Service: Vascular;  Laterality: Bilateral;   CARDIAC CATHETERIZATION  2006   [place]   "@ [facility]; when I had my heart attack"   CARDIAC CATHETERIZATION N/A 07/29/2016   Procedure: Left Heart Cath and Coronary Angiography;  Surgeon: Lorretta Harp, MD;  Location: [facility];  Service: Cardiovascular;  Laterality: N/A;   CARDIAC CATHETERIZATION N/A 07/29/2016   Procedure: Coronary Stent Intervention;  Surgeon: Lorretta Harp, MD;  Location: [facility];  Service: Cardiovascular;  Laterality: N/A;   CARPAL TUNNEL RELEASE Bilateral    CORONARY ANGIOPLASTY WITH STENT PLACEMENT  07/29/2016   CORONARY ARTERY BYPASS GRAFT N/A 07/24/2020   Procedure: CORONARY ARTERY BYPASS GRAFTING (CABG), ON PUMP, TIMES FOUR, USING LEFT INTERNAL MAMMARY ARTERY AND ENDOSCOPICALLY HARVESTED RIGHT GREATER SAPHENOUS VEIN;  Surgeon: Lajuana Matte, MD;  Location: [facility];  Service: Open Heart Surgery;  Laterality: N/A;  FLOW TAC   CORONARY STENT INTERVENTION N/A 05/30/2019   Procedure: CORONARY STENT INTERVENTION;  Surgeon: Belva Crome, MD;  Location: [facility];  Service: Cardiovascular;  Laterality: N/A;   CORONARY STENT INTERVENTION N/A 11/02/2019   Procedure: CORONARY STENT INTERVENTION;  Surgeon: Wellington Hampshire, MD;  Location: [facility];  Service: Cardiovascular;  Laterality: N/A;   ESOPHAGOGASTRODUODENOSCOPY N/A 09/22/2017   Procedure: ESOPHAGOGASTRODUODENOSCOPY (EGD);  Surgeon: Ladene Artist, MD;  Location: [facility];  Service: Endoscopy;  Laterality: N/A;   INTRAVASCULAR PRESSURE WIRE/FFR STUDY N/A 07/22/2020   Procedure: INTRAVASCULAR PRESSURE WIRE/FFR STUDY;  Surgeon: Leonie Man, MD;  Location: [facility];  Service: Cardiovascular;  Laterality: N/A;   KNEE ARTHROSCOPY Bilateral    "2 on left; 1 on the right"   LEFT HEART CATH AND CORONARY ANGIOGRAPHY N/A 11/02/2019   Procedure: LEFT HEART CATH AND CORONARY ANGIOGRAPHY;  Surgeon: Wellington Hampshire, MD;  Location: [facility]  INV[facility];  Service: Cardiovascular;  Laterality: N/A;   LEFT HEART CATH AND CORONARY ANGIOGRAPHY N/A 07/22/2020   Procedure: LEFT HEART CATH AND CORONARY ANGIOGRAPHY;  Surgeon: Leonie Man, MD;  Location: [facility];  Service: Cardiovascular;  Laterality: N/A;   LEFT HEART CATH AND CORS/GRAFTS ANGIOGRAPHY N/A 08/17/2021   Procedure: LEFT HEART CATH AND CORS/GRAFTS ANGIOGRAPHY;  Surgeon: Nelva Bush, MD;  Location: [facility];  Service: Cardiovascular;  Laterality: N/A;   PERIPHERAL VASCULAR INTERVENTION Left 10/11/2020   popliteal and SFA stent placement    PERIPHERAL VASCULAR INTERVENTION Left 10/10/2020   Procedure: PERIPHERAL VASCULAR INTERVENTION;  Surgeon: Elam Dutch, MD;  Location: [facility];  Service: Cardiovascular;  Laterality: Left;   RIGHT/LEFT HEART CATH AND CORONARY ANGIOGRAPHY N/A 05/30/2019   Procedure: RIGHT/LEFT HEART CATH AND CORONARY ANGIOGRAPHY;  Surgeon: Belva Crome, MD;  Location: [facility];  Service: Cardiovascular;  Laterality: N/A;   SHOULDER OPEN ROTATOR CUFF REPAIR Bilateral    TEE WITHOUT CARDIOVERSION N/A 07/24/2020   Procedure: TRANSESOPHAGEAL ECHOCARDIOGRAM (TEE);  Surgeon: Lajuana Matte, MD;  Location: [facility];  Service: Open Heart Surgery;  Laterality: N/A;   Social History   Occupational History   Occupation: disabled     Fish farm manager: UNEMPLOYED  Tobacco Use   Smoking status: Former    Packs/day: 1.00    Years: 34.00    Pack years: 34.00    Types: Cigarettes    Quit date: 07/12/2020    Years since quitting: 1.4   Smokeless tobacco: Former    Types: Snuff, Chew  Vaping Use   Vaping Use: Never used  Substance and  Sexual Activity   Alcohol use: Yes    Comment: RARE   Drug use: No   Sexual activity: Yes

## 2021-12-15 ENCOUNTER — Encounter: Payer: Self-pay | Admitting: Orthopedic Surgery

## 2021-12-15 ENCOUNTER — Telehealth: Payer: Self-pay | Admitting: Orthopedic Surgery

## 2021-12-15 NOTE — Telephone Encounter (Signed)
I have sent Denny Peon, NP the message with pictures to get her advice on the situation.

## 2021-12-15 NOTE — Telephone Encounter (Signed)
Pts wife Herbert Seta came in stating the pt isn't being cared for properly at the [facility]  [facility] in [place]. Herbert Seta states the pt had an appt on 12/10/21 to have his dressings changed and the facility didn't change them again until 3 pm on 12/14/21.  Also heather states the facility was  trying to D/C the pt on 12/07/21 due to insurance and she had to fight them on that. She states she will send pictures of the pts foot via mychart and if Dr.Duda thinks he will be ok to come home she would like to sign him out of the facility as POA. Herbert Seta also states that if Dr.Duda releases the pt to go home she can do the dressing changes herself so he wouldn't require a home health nurse; so she would like a CB asap to update her on what can be done.   [phone]

## 2021-12-16 NOTE — Telephone Encounter (Signed)
Herbert Seta, wife, informed.

## 2021-12-16 NOTE — Telephone Encounter (Signed)
Sure, we can call and offer earlier appointment, foot actually looks ok

## 2021-12-18 ENCOUNTER — Telehealth: Payer: Self-pay | Admitting: Orthopedic Surgery

## 2021-12-18 NOTE — Telephone Encounter (Signed)
Received vm from pts wife,  stating to disregard FL2 as patient came home. I relayed this to AF. Ph [phone]

## 2021-12-22 ENCOUNTER — Other Ambulatory Visit (HOSPITAL_COMMUNITY): Payer: Self-pay

## 2021-12-23 ENCOUNTER — Encounter: Payer: Self-pay | Admitting: Family

## 2021-12-23 ENCOUNTER — Other Ambulatory Visit: Payer: Self-pay

## 2021-12-23 ENCOUNTER — Ambulatory Visit (INDEPENDENT_AMBULATORY_CARE_PROVIDER_SITE_OTHER): Payer: Medicare HMO | Admitting: Family

## 2021-12-23 DIAGNOSIS — Z89432 Acquired absence of left foot: Secondary | ICD-10-CM

## 2021-12-23 MED ORDER — DOXYCYCLINE HYCLATE 100 MG PO TABS: 100.0000 mg | ORAL_TABLET | Freq: Two times a day (BID) | ORAL | 0 refills | Status: DC

## 2021-12-23 NOTE — Progress Notes (Signed)
Office Visit Note   Patient: Joseph [name]           Date of Birth: [birth year]           MRN: [id number] Visit Date: 12/23/2021              Requested by: [name], NP [address] [place],  [place] [address] PCP: [name], NP  Chief Complaint  Patient presents with   Left Foot - Routine Post Op    11/20/21 transmet amputation       HPI: The patient is a 51 year old gentleman seen status post left transmetatarsal amputation currently being discharged home from skilled nursing unfortunately he has been having difficulty healing his incision site.  Sutures are still in place   According to the patient and his wife its not clear whether he took any of the doxycycline that was prescribed at last visit     Assessment & Plan: Visit Diagnoses:  No diagnosis found.   Plan: Is to continue working on daily Dial soap cleansing.  Dry dressing changes.  We will change this daily or as needed soiled work on elevation to minimize weightbearing will resend his doxycycline in he is to follow-up in the office in 10 days with Dr. Sharol Given Follow-Up Instructions: No follow-ups on file.   Ortho Exam  Patient is alert, oriented, no adenopathy, well-dressed, normal affect, normal respiratory effort.  On examination of the left lower extremity there is mild erythema today trace edema.  Incision is approximated with sutures.  This has not yet healed. there is scant clear drainage there is no cellulitis no foul odor Dopplerable biphasic pulse left dorsalis pedis Imaging: No results found. No images are attached to the encounter.  Labs: Lab Results  Component Value Date   HGBA1C 10.0 (H) 11/18/2021   HGBA1C 11.8 (H) 08/15/2021   HGBA1C 13.7 (H) 07/10/2021   ESRSEDRATE 45 (H) 11/18/2021   ESRSEDRATE 41 (H) 09/16/2017   CRP 12.1 (H) 11/18/2021   CRP 8.9 (H) 09/16/2017   REPTSTATUS 11/22/2021 FINAL 11/17/2021   REPTSTATUS 11/22/2021 FINAL 11/17/2021   CULT  11/17/2021     NO GROWTH 5 DAYS Performed at [facility], [place] [address], [place], [place] [address]    CULT  11/17/2021    NO GROWTH 5 DAYS Performed at [facility] [address] [place],  [address]      Lab Results  Component Value Date   ALBUMIN 1.8 (L) 11/25/2021   ALBUMIN 2.6 (L) 11/17/2021   ALBUMIN 3.1 (L) 07/17/2021    Lab Results  Component Value Date   MG 1.6 (L) 07/17/2021   MG 1.5 (L) 07/10/2021   MG 1.6 (L) 04/14/2021   No results found for: VD25OH  No results found for: PREALBUMIN CBC EXTENDED Latest Ref Rng & Units 11/23/2021 11/21/2021 11/20/2021  WBC 4.0 - 10.5 K/uL 10.5 10.4 14.6(H)  RBC 4.22 - 5.81 MIL/uL 3.93(L) 3.91(L) 4.53  HGB 13.0 - 17.0 g/dL 10.0(L) 9.9(L) 11.6(L)  HCT 39.0 - 52.0 % 31.9(L) 31.6(L) 36.8(L)  PLT 150 - 400 K/uL 387 281 325  NEUTROABS 1.7 - 7.7 K/uL - - -  LYMPHSABS 0.7 - 4.0 K/uL - - -     There is no height or weight on file to calculate BMI.  Orders:  No orders of the defined types were placed in this encounter.  No orders of the defined types were placed in this encounter.    Procedures:  No procedures performed  Clinical Data: No additional findings.  ROS:  All other systems negative, except as noted in the HPI. Review of Systems  Objective: Vital Signs: There were no vitals taken for this visit.  Specialty Comments:  No specialty comments available.  PMFS History: Patient Active Problem List   Diagnosis Date Noted   Bipolar disorder with depression (HCC)    Severe sepsis (HCC) 11/18/2021   Chest pain 11/18/2021   Dyspnea 11/18/2021   Wound infection    Ulcer of left foot with necrosis of bone (HCC)    Bicuspid aortic valve 09/14/2021   Aneurysm of ascending aorta 09/14/2021   Hyperkalemia 08/20/2021   DM (diabetes mellitus) with peripheral vascular complication (HCC) 08/20/2021   Atypical chest pain 07/17/2021   Hyponatremia 07/17/2021   Leg wound, left 07/17/2021   Essential  hypertension 07/17/2021   Acute hyperglycemia 07/09/2021   Left leg cellulitis 04/11/2021   Cellulitis of left leg 03/05/2021   Cellulitis 02/18/2021   PAD (peripheral artery disease) (HCC) 10/10/2020   Osteomyelitis of fourth toe of right foot (HCC)    S/P CABG x 4 07/24/2020   Non-ST elevation (NSTEMI) myocardial infarction (HCC) 07/22/2020   Acute on chronic diastolic heart failure (HCC) 07/22/2020   Hypotension 07/18/2020   Left leg swelling 07/18/2020   Elevated troponin 07/18/2020   AKI (acute kidney injury) (HCC) 07/18/2020   Elevated brain natriuretic peptide (BNP) level 07/18/2020   GERD (gastroesophageal reflux disease) 07/18/2020   Unstable angina (HCC) 11/01/2019   Edema 06/13/2019   Leukocytosis 05/31/2019   Smoker 03/08/2019   Chronic pain 03/08/2019   Chronic back pain 02/16/2019   Deep venous thrombosis (HCC) 02/16/2019   Obesity, Class III, BMI 40-49.9 (morbid obesity) (HCC) 02/16/2019   Peripheral nerve disease 02/16/2019   COPD (chronic obstructive pulmonary disease) (HCC) 01/22/2019   Anxiety disorder 03/26/2018   Open wound of toe 10/12/2017   Sinusitis 10/12/2017   Abdominal pain    Loss of weight    Diabetic foot infection (HCC) 09/16/2017   Uncontrolled type 2 diabetes mellitus with hyperglycemia, with long-term current use of insulin (HCC) 09/16/2017   Cellulitis of right foot    Chronic ulcer of great toe of right foot (HCC) 09/09/2017   Recurrent chest pain 07/29/2016   Hyperglycemia    Insulin dependent type 2 diabetes mellitus (HCC) 04/29/2015   OSA (obstructive sleep apnea) 05/08/2013   History of pulmonary embolus (PE) 04/27/2013   CAD -S/P PCI 11/02/19  09/10/2008   Hyperlipidemia 09/09/2008   Past Medical History:  Diagnosis Date   Aneurysm of ascending aorta 09/14/2021   Anxiety    Arthritis    "knees, shoulders, hips, ankles" (07/29/2016)   Asthma    Bicuspid aortic valve 09/14/2021   CAD (coronary artery disease)    a. 2017: s/p BMS  to distal Cx; b. LHC 05/30/2019: 80% mid, distal RCA s/p DES, 30% narrowing of d LM, widely patent LAD w/ luminal irregularities, widely patent stent in dCX  w/ 90+% stenosis distal to stent beofre small trifurcating obtuse marginal (potentially area of restenosis)   Chronic bronchitis (HCC)    Chronic ulcer of right great toe (HCC) 09/19/2017   COPD (chronic obstructive pulmonary disease) (HCC)    Depression    DVT (deep venous thrombosis) (HCC)    GERD (gastroesophageal reflux disease)    Hyperlipidemia    Hypertension    Myocardial infarction (HCC) 1996   "light one"   PE (pulmonary embolism) 04/2013  On chronic Xarelto   Peripheral nerve disease    Pneumonia "several times"   Sleep apnea    Type II diabetes mellitus ([place])     Family History  Problem Relation Age of Onset   Hypertension Brother    Hypertension Father    Diabetes Other    Hyperlipidemia Other     Past Surgical History:  Procedure Laterality Date   ABDOMINAL AORTOGRAM W/LOWER EXTREMITY N/A 10/10/2020   Procedure: ABDOMINAL AORTOGRAM W/LOWER EXTREMITY;  Surgeon: Elam Dutch, MD;  Location: [facility];  Service: Cardiovascular;  Laterality: N/A;   AMPUTATION Right 09/21/2017   Procedure: RIGHT GREAT TOE AMPUTATION, POSSIBLE VAC;  Surgeon: Leandrew Koyanagi, MD;  Location: [facility];  Service: Orthopedics;  Laterality: Right;   AMPUTATION Left 08/13/2020   Procedure: LEFT FOOT 4TH RAY AMPUTATION;  Surgeon: Newt Minion, MD;  Location: [facility];  Service: Orthopedics;  Laterality: Left;   AMPUTATION Left 11/20/2021   Procedure: LEFT TRANSMETATARSAL AMPUTATION;  Surgeon: Newt Minion, MD;  Location: [facility];  Service: Orthopedics;  Laterality: Left;   AORTOGRAM Bilateral 03/13/2021   Procedure: ABDOMINAL AORTOGRAM WITH Left LOWER EXTREMITY RUNOFF;  Surgeon: Cherre Robins, MD;  Location: [facility];  Service: Vascular;  Laterality: Bilateral;   CARDIAC CATHETERIZATION  2006   [place]   "@ [facility];  when I had my heart attack"   CARDIAC CATHETERIZATION N/A 07/29/2016   Procedure: Left Heart Cath and Coronary Angiography;  Surgeon: Lorretta Harp, MD;  Location: [facility];  Service: Cardiovascular;  Laterality: N/A;   CARDIAC CATHETERIZATION N/A 07/29/2016   Procedure: Coronary Stent Intervention;  Surgeon: Lorretta Harp, MD;  Location: [facility];  Service: Cardiovascular;  Laterality: N/A;   CARPAL TUNNEL RELEASE Bilateral    CORONARY ANGIOPLASTY WITH STENT PLACEMENT  07/29/2016   CORONARY ARTERY BYPASS GRAFT N/A 07/24/2020   Procedure: CORONARY ARTERY BYPASS GRAFTING (CABG), ON PUMP, TIMES FOUR, USING LEFT INTERNAL MAMMARY ARTERY AND ENDOSCOPICALLY HARVESTED RIGHT GREATER SAPHENOUS VEIN;  Surgeon: Lajuana Matte, MD;  Location: [facility];  Service: Open Heart Surgery;  Laterality: N/A;  FLOW TAC   CORONARY STENT INTERVENTION N/A 05/30/2019   Procedure: CORONARY STENT INTERVENTION;  Surgeon: Belva Crome, MD;  Location: [facility];  Service: Cardiovascular;  Laterality: N/A;   CORONARY STENT INTERVENTION N/A 11/02/2019   Procedure: CORONARY STENT INTERVENTION;  Surgeon: Wellington Hampshire, MD;  Location: [facility];  Service: Cardiovascular;  Laterality: N/A;   ESOPHAGOGASTRODUODENOSCOPY N/A 09/22/2017   Procedure: ESOPHAGOGASTRODUODENOSCOPY (EGD);  Surgeon: Ladene Artist, MD;  Location: [facility];  Service: Endoscopy;  Laterality: N/A;   INTRAVASCULAR PRESSURE WIRE/FFR STUDY N/A 07/22/2020   Procedure: INTRAVASCULAR PRESSURE WIRE/FFR STUDY;  Surgeon: Leonie Man, MD;  Location: [facility];  Service: Cardiovascular;  Laterality: N/A;   KNEE ARTHROSCOPY Bilateral    "2 on left; 1 on the right"   LEFT HEART CATH AND CORONARY ANGIOGRAPHY N/A 11/02/2019   Procedure: LEFT HEART CATH AND CORONARY ANGIOGRAPHY;  Surgeon: Wellington Hampshire, MD;  Location: [facility];  Service: Cardiovascular;  Laterality: N/A;   LEFT HEART CATH AND  CORONARY ANGIOGRAPHY N/A 07/22/2020   Procedure: LEFT HEART CATH AND CORONARY ANGIOGRAPHY;  Surgeon: Leonie Man, MD;  Location: [facility];  Service: Cardiovascular;  Laterality: N/A;   LEFT HEART CATH AND CORS/GRAFTS ANGIOGRAPHY N/A 08/17/2021   Procedure: LEFT HEART CATH  AND CORS/GRAFTS ANGIOGRAPHY;  Surgeon: Nelva Bush, MD;  Location: [facility];  Service: Cardiovascular;  Laterality: N/A;   PERIPHERAL VASCULAR INTERVENTION Left 10/11/2020   popliteal and SFA stent placement    PERIPHERAL VASCULAR INTERVENTION Left 10/10/2020   Procedure: PERIPHERAL VASCULAR INTERVENTION;  Surgeon: Elam Dutch, MD;  Location: [facility];  Service: Cardiovascular;  Laterality: Left;   RIGHT/LEFT HEART CATH AND CORONARY ANGIOGRAPHY N/A 05/30/2019   Procedure: RIGHT/LEFT HEART CATH AND CORONARY ANGIOGRAPHY;  Surgeon: Belva Crome, MD;  Location: [facility];  Service: Cardiovascular;  Laterality: N/A;   SHOULDER OPEN ROTATOR CUFF REPAIR Bilateral    TEE WITHOUT CARDIOVERSION N/A 07/24/2020   Procedure: TRANSESOPHAGEAL ECHOCARDIOGRAM (TEE);  Surgeon: Lajuana Matte, MD;  Location: [facility];  Service: Open Heart Surgery;  Laterality: N/A;   Social History   Occupational History   Occupation: disabled     Fish farm manager: UNEMPLOYED  Tobacco Use   Smoking status: Former    Packs/day: 1.00    Years: 34.00    Pack years: 34.00    Types: Cigarettes    Quit date: 07/12/2020    Years since quitting: 1.4   Smokeless tobacco: Former    Types: Snuff, Chew  Vaping Use   Vaping Use: Never used  Substance and Sexual Activity   Alcohol use: Yes    Comment: RARE   Drug use: No   Sexual activity: Yes

## 2021-12-25 ENCOUNTER — Telehealth: Payer: Self-pay | Admitting: Family

## 2021-12-25 ENCOUNTER — Other Ambulatory Visit: Payer: Self-pay

## 2021-12-25 MED ORDER — DOXYCYCLINE HYCLATE 100 MG PO TABS: 100.0000 mg | ORAL_TABLET | Freq: Two times a day (BID) | ORAL | 0 refills | Status: DC

## 2021-12-25 NOTE — Telephone Encounter (Signed)
Corrected this and sent to pharm as requested. Called pt to advise lm on vm

## 2021-12-25 NOTE — Telephone Encounter (Signed)
Patient called advised the Rx for the antibiotic was not received at the pharmacy. (Doxycycline)  Patient said the Rx should have been sent to the [facility] at [facility]. The number to contact patient is [phone]

## 2022-01-04 ENCOUNTER — Other Ambulatory Visit (HOSPITAL_COMMUNITY): Payer: Self-pay

## 2022-01-06 ENCOUNTER — Inpatient Hospital Stay (HOSPITAL_COMMUNITY): Payer: Medicare HMO

## 2022-01-06 ENCOUNTER — Emergency Department (HOSPITAL_COMMUNITY): Payer: Medicare HMO

## 2022-01-06 ENCOUNTER — Encounter (HOSPITAL_COMMUNITY): Payer: Self-pay | Admitting: Family Medicine

## 2022-01-06 ENCOUNTER — Other Ambulatory Visit: Payer: Self-pay

## 2022-01-06 ENCOUNTER — Inpatient Hospital Stay (HOSPITAL_COMMUNITY)
Admission: EM | Admit: 2022-01-06 | Discharge: 2022-01-14 | DRG: 474 | Disposition: A | Discharge status: Skilled Nursing Facility | Payer: Medicare HMO | Attending: Family Medicine | Admitting: Family Medicine

## 2022-01-06 DIAGNOSIS — L97511 Non-pressure chronic ulcer of other part of right foot limited to breakdown of skin: Secondary | ICD-10-CM

## 2022-01-06 DIAGNOSIS — Z86711 Personal history of pulmonary embolism: Secondary | ICD-10-CM

## 2022-01-06 DIAGNOSIS — E875 Hyperkalemia: Secondary | ICD-10-CM | POA: Diagnosis present

## 2022-01-06 DIAGNOSIS — R6520 Severe sepsis without septic shock: Secondary | ICD-10-CM | POA: Diagnosis present

## 2022-01-06 DIAGNOSIS — I2510 Atherosclerotic heart disease of native coronary artery without angina pectoris: Secondary | ICD-10-CM | POA: Diagnosis present

## 2022-01-06 DIAGNOSIS — Z6841 Body Mass Index (BMI) 40.0 and over, adult: Secondary | ICD-10-CM

## 2022-01-06 DIAGNOSIS — N179 Acute kidney failure, unspecified: Secondary | ICD-10-CM | POA: Diagnosis present

## 2022-01-06 DIAGNOSIS — U071 COVID-19: Secondary | ICD-10-CM | POA: Diagnosis present

## 2022-01-06 DIAGNOSIS — E1151 Type 2 diabetes mellitus with diabetic peripheral angiopathy without gangrene: Secondary | ICD-10-CM | POA: Diagnosis present

## 2022-01-06 DIAGNOSIS — E1169 Type 2 diabetes mellitus with other specified complication: Secondary | ICD-10-CM | POA: Diagnosis present

## 2022-01-06 DIAGNOSIS — Z7985 Long-term (current) use of injectable non-insulin antidiabetic drugs: Secondary | ICD-10-CM

## 2022-01-06 DIAGNOSIS — R0902 Hypoxemia: Secondary | ICD-10-CM | POA: Diagnosis not present

## 2022-01-06 DIAGNOSIS — Z955 Presence of coronary angioplasty implant and graft: Secondary | ICD-10-CM

## 2022-01-06 DIAGNOSIS — Z951 Presence of aortocoronary bypass graft: Secondary | ICD-10-CM

## 2022-01-06 DIAGNOSIS — K219 Gastro-esophageal reflux disease without esophagitis: Secondary | ICD-10-CM | POA: Diagnosis present

## 2022-01-06 DIAGNOSIS — Y835 Amputation of limb(s) as the cause of abnormal reaction of the patient, or of later complication, without mention of misadventure at the time of the procedure: Secondary | ICD-10-CM | POA: Diagnosis present

## 2022-01-06 DIAGNOSIS — Z7984 Long term (current) use of oral hypoglycemic drugs: Secondary | ICD-10-CM

## 2022-01-06 DIAGNOSIS — Z89411 Acquired absence of right great toe: Secondary | ICD-10-CM

## 2022-01-06 DIAGNOSIS — F319 Bipolar disorder, unspecified: Secondary | ICD-10-CM | POA: Diagnosis present

## 2022-01-06 DIAGNOSIS — I110 Hypertensive heart disease with heart failure: Secondary | ICD-10-CM | POA: Diagnosis present

## 2022-01-06 DIAGNOSIS — T8781 Dehiscence of amputation stump: Secondary | ICD-10-CM | POA: Diagnosis present

## 2022-01-06 DIAGNOSIS — Z87891 Personal history of nicotine dependence: Secondary | ICD-10-CM

## 2022-01-06 DIAGNOSIS — E872 Acidosis, unspecified: Secondary | ICD-10-CM | POA: Diagnosis present

## 2022-01-06 DIAGNOSIS — R911 Solitary pulmonary nodule: Secondary | ICD-10-CM | POA: Diagnosis present

## 2022-01-06 DIAGNOSIS — R55 Syncope and collapse: Secondary | ICD-10-CM | POA: Diagnosis not present

## 2022-01-06 DIAGNOSIS — T8744 Infection of amputation stump, left lower extremity: Secondary | ICD-10-CM | POA: Diagnosis present

## 2022-01-06 DIAGNOSIS — L89891 Pressure ulcer of other site, stage 1: Secondary | ICD-10-CM | POA: Diagnosis present

## 2022-01-06 DIAGNOSIS — I5032 Chronic diastolic (congestive) heart failure: Secondary | ICD-10-CM | POA: Diagnosis present

## 2022-01-06 DIAGNOSIS — Z9181 History of falling: Secondary | ICD-10-CM

## 2022-01-06 DIAGNOSIS — I252 Old myocardial infarction: Secondary | ICD-10-CM

## 2022-01-06 DIAGNOSIS — Z86718 Personal history of other venous thrombosis and embolism: Secondary | ICD-10-CM

## 2022-01-06 DIAGNOSIS — Z794 Long term (current) use of insulin: Secondary | ICD-10-CM

## 2022-01-06 DIAGNOSIS — A419 Sepsis, unspecified organism: Secondary | ICD-10-CM | POA: Diagnosis present

## 2022-01-06 DIAGNOSIS — J449 Chronic obstructive pulmonary disease, unspecified: Secondary | ICD-10-CM | POA: Diagnosis present

## 2022-01-06 DIAGNOSIS — Z8673 Personal history of transient ischemic attack (TIA), and cerebral infarction without residual deficits: Secondary | ICD-10-CM

## 2022-01-06 DIAGNOSIS — W19XXXA Unspecified fall, initial encounter: Secondary | ICD-10-CM

## 2022-01-06 DIAGNOSIS — I739 Peripheral vascular disease, unspecified: Secondary | ICD-10-CM | POA: Diagnosis not present

## 2022-01-06 DIAGNOSIS — Z7982 Long term (current) use of aspirin: Secondary | ICD-10-CM

## 2022-01-06 DIAGNOSIS — M86171 Other acute osteomyelitis, right ankle and foot: Secondary | ICD-10-CM | POA: Diagnosis present

## 2022-01-06 DIAGNOSIS — M86272 Subacute osteomyelitis, left ankle and foot: Secondary | ICD-10-CM | POA: Diagnosis not present

## 2022-01-06 DIAGNOSIS — S88112A Complete traumatic amputation at level between knee and ankle, left lower leg, initial encounter: Secondary | ICD-10-CM

## 2022-01-06 DIAGNOSIS — M25552 Pain in left hip: Secondary | ICD-10-CM | POA: Diagnosis present

## 2022-01-06 DIAGNOSIS — M869 Osteomyelitis, unspecified: Secondary | ICD-10-CM | POA: Diagnosis present

## 2022-01-06 DIAGNOSIS — L03116 Cellulitis of left lower limb: Secondary | ICD-10-CM | POA: Diagnosis present

## 2022-01-06 DIAGNOSIS — Z7902 Long term (current) use of antithrombotics/antiplatelets: Secondary | ICD-10-CM

## 2022-01-06 DIAGNOSIS — E785 Hyperlipidemia, unspecified: Secondary | ICD-10-CM | POA: Diagnosis present

## 2022-01-06 DIAGNOSIS — Z8249 Family history of ischemic heart disease and other diseases of the circulatory system: Secondary | ICD-10-CM

## 2022-01-06 DIAGNOSIS — Z79899 Other long term (current) drug therapy: Secondary | ICD-10-CM

## 2022-01-06 LAB — BASIC METABOLIC PANEL
Anion gap: 8 (ref 5–15)
BUN: 43 mg/dL — ABNORMAL HIGH (ref 6–20)
CO2: 25 mmol/L (ref 22–32)
Calcium: 8.5 mg/dL — ABNORMAL LOW (ref 8.9–10.3)
Chloride: 105 mmol/L (ref 98–111)
Creatinine, Ser: 2.09 mg/dL — ABNORMAL HIGH (ref 0.61–1.24)
GFR, Estimated: 38 mL/min — ABNORMAL LOW (ref >60)
Glucose, Bld: 137 mg/dL — ABNORMAL HIGH (ref 70–99)
Potassium: 5.9 mmol/L — ABNORMAL HIGH (ref 3.5–5.1)
Sodium: 138 mmol/L (ref 135–145)

## 2022-01-06 LAB — CBC WITH DIFFERENTIAL/PLATELET
Abs Immature Granulocytes: 0.1 10*3/uL — ABNORMAL HIGH (ref 0.00–0.07)
Basophils Absolute: 0.1 10*3/uL (ref 0.0–0.1)
Basophils Relative: 1 %
Eosinophils Absolute: 0.1 10*3/uL (ref 0.0–0.5)
Eosinophils Relative: 1 %
HCT: 36.6 % — ABNORMAL LOW (ref 39.0–52.0)
Hemoglobin: 10.6 g/dL — ABNORMAL LOW (ref 13.0–17.0)
Immature Granulocytes: 1 %
Lymphocytes Relative: 18 %
Lymphs Abs: 2.2 10*3/uL (ref 0.7–4.0)
MCH: 24.4 pg — ABNORMAL LOW (ref 26.0–34.0)
MCHC: 29 g/dL — ABNORMAL LOW (ref 30.0–36.0)
MCV: 84.1 fL (ref 80.0–100.0)
Monocytes Absolute: 1 10*3/uL (ref 0.1–1.0)
Monocytes Relative: 9 %
Neutro Abs: 8.7 10*3/uL — ABNORMAL HIGH (ref 1.7–7.7)
Neutrophils Relative %: 70 %
Platelets: 418 10*3/uL — ABNORMAL HIGH (ref 150–400)
RBC: 4.35 MIL/uL (ref 4.22–5.81)
RDW: 16.3 % — ABNORMAL HIGH (ref 11.5–15.5)
WBC: 12.3 10*3/uL — ABNORMAL HIGH (ref 4.0–10.5)
nRBC: 0 % (ref 0.0–0.2)

## 2022-01-06 LAB — APTT: aPTT: 36 seconds (ref 24–36)

## 2022-01-06 LAB — PROTIME-INR
INR: 1.1 (ref 0.8–1.2)
Prothrombin Time: 14.4 seconds (ref 11.4–15.2)

## 2022-01-06 LAB — URINALYSIS, ROUTINE W REFLEX MICROSCOPIC
Bilirubin Urine: NEGATIVE
Glucose, UA: NEGATIVE mg/dL
Hgb urine dipstick: NEGATIVE
Ketones, ur: NEGATIVE mg/dL
Leukocytes,Ua: NEGATIVE
Nitrite: NEGATIVE
Protein, ur: 100 mg/dL — AB
Specific Gravity, Urine: 1.025 (ref 1.005–1.030)
pH: 5.5 (ref 5.0–8.0)

## 2022-01-06 LAB — COMPREHENSIVE METABOLIC PANEL
ALT: 13 U/L (ref 0–44)
AST: 22 U/L (ref 15–41)
Albumin: 2.4 g/dL — ABNORMAL LOW (ref 3.5–5.0)
Alkaline Phosphatase: 88 U/L (ref 38–126)
Anion gap: 8 (ref 5–15)
BUN: 45 mg/dL — ABNORMAL HIGH (ref 6–20)
CO2: 25 mmol/L (ref 22–32)
Calcium: 8.6 mg/dL — ABNORMAL LOW (ref 8.9–10.3)
Chloride: 104 mmol/L (ref 98–111)
Creatinine, Ser: 2.22 mg/dL — ABNORMAL HIGH (ref 0.61–1.24)
GFR, Estimated: 36 mL/min — ABNORMAL LOW (ref >60)
Glucose, Bld: 170 mg/dL — ABNORMAL HIGH (ref 70–99)
Potassium: 6.3 mmol/L (ref 3.5–5.1)
Sodium: 137 mmol/L (ref 135–145)
Total Bilirubin: 0.4 mg/dL (ref 0.3–1.2)
Total Protein: 7.1 g/dL (ref 6.5–8.1)

## 2022-01-06 LAB — I-STAT VENOUS BLOOD GAS, ED
Acid-Base Excess: 1 mmol/L (ref 0.0–2.0)
Bicarbonate: 26.3 mmol/L (ref 20.0–28.0)
Calcium, Ion: 1.13 mmol/L — ABNORMAL LOW (ref 1.15–1.40)
HCT: 32 % — ABNORMAL LOW (ref 39.0–52.0)
Hemoglobin: 10.9 g/dL — ABNORMAL LOW (ref 13.0–17.0)
O2 Saturation: 97 %
Potassium: 6.8 mmol/L (ref 3.5–5.1)
Sodium: 138 mmol/L (ref 135–145)
TCO2: 28 mmol/L (ref 22–32)
pCO2, Ven: 43.9 mmHg — ABNORMAL LOW (ref 44.0–60.0)
pH, Ven: 7.386 (ref 7.250–7.430)
pO2, Ven: 95 mmHg — ABNORMAL HIGH (ref 32.0–45.0)

## 2022-01-06 LAB — RESP PANEL BY RT-PCR (FLU A&B, COVID) ARPGX2
Influenza A by PCR: NEGATIVE
Influenza B by PCR: NEGATIVE
SARS Coronavirus 2 by RT PCR: POSITIVE — AB

## 2022-01-06 LAB — CBG MONITORING, ED
Glucose-Capillary: 173 mg/dL — ABNORMAL HIGH (ref 70–99)
Glucose-Capillary: 209 mg/dL — ABNORMAL HIGH (ref 70–99)
Glucose-Capillary: 123 mg/dL — ABNORMAL HIGH (ref 70–99)
Glucose-Capillary: 136 mg/dL — ABNORMAL HIGH (ref 70–99)

## 2022-01-06 LAB — URINALYSIS, MICROSCOPIC (REFLEX)

## 2022-01-06 LAB — TROPONIN I (HIGH SENSITIVITY)
Troponin I (High Sensitivity): 37 ng/L — ABNORMAL HIGH (ref <18)
Troponin I (High Sensitivity): 31 ng/L — ABNORMAL HIGH (ref <18)

## 2022-01-06 LAB — D-DIMER, QUANTITATIVE: D-Dimer, Quant: 1.04 ug/mL-FEU — ABNORMAL HIGH (ref 0.00–0.50)

## 2022-01-06 LAB — LACTIC ACID, PLASMA
Lactic Acid, Venous: 3 mmol/L (ref 0.5–1.9)
Lactic Acid, Venous: 2.2 mmol/L (ref 0.5–1.9)

## 2022-01-06 MED ORDER — OXYCODONE HCL 5 MG PO TABS
10.0000 mg | ORAL_TABLET | Freq: Four times a day (QID) | ORAL | Status: DC | PRN
  Administered 2022-01-06 – 2022-01-10 (×10): 10 mg via ORAL
  Filled 2022-01-06 (×11): qty 2

## 2022-01-06 MED ORDER — PREGABALIN 75 MG PO CAPS
150.0000 mg | ORAL_CAPSULE | Freq: Every day | ORAL | Status: DC
  Administered 2022-01-06 – 2022-01-13 (×8): 150 mg via ORAL
  Filled 2022-01-06 (×8): qty 2

## 2022-01-06 MED ORDER — PREGABALIN 100 MG PO CAPS
300.0000 mg | ORAL_CAPSULE | Freq: Every morning | ORAL | Status: DC
  Administered 2022-01-07 – 2022-01-14 (×8): 300 mg via ORAL
  Filled 2022-01-06 (×8): qty 3

## 2022-01-06 MED ORDER — ATORVASTATIN CALCIUM 80 MG PO TABS
80.0000 mg | ORAL_TABLET | Freq: Every day | ORAL | Status: DC
  Administered 2022-01-06 – 2022-01-14 (×9): 80 mg via ORAL
  Filled 2022-01-06 (×9): qty 1

## 2022-01-06 MED ORDER — ICOSAPENT ETHYL 1 G PO CAPS
2.0000 g | ORAL_CAPSULE | Freq: Two times a day (BID) | ORAL | Status: DC
  Administered 2022-01-06 – 2022-01-14 (×17): 2 g via ORAL
  Filled 2022-01-06 (×19): qty 2

## 2022-01-06 MED ORDER — METRONIDAZOLE 500 MG/100ML IV SOLN
500.0000 mg | Freq: Two times a day (BID) | INTRAVENOUS | Status: AC
  Administered 2022-01-06 – 2022-01-09 (×6): 500 mg via INTRAVENOUS
  Filled 2022-01-06 (×6): qty 100

## 2022-01-06 MED ORDER — LACTATED RINGERS IV BOLUS (SEPSIS)
1000.0000 mL | Freq: Once | INTRAVENOUS | Status: AC
  Administered 2022-01-06: 1000 mL via INTRAVENOUS

## 2022-01-06 MED ORDER — IOHEXOL 350 MG/ML SOLN
65.0000 mL | Freq: Once | INTRAVENOUS | Status: AC | PRN
  Administered 2022-01-06: 65 mL via INTRAVENOUS

## 2022-01-06 MED ORDER — INSULIN GLARGINE-YFGN 100 UNIT/ML ~~LOC~~ SOLN
30.0000 [IU] | Freq: Every day | SUBCUTANEOUS | Status: DC
  Administered 2022-01-06 – 2022-01-08 (×3): 30 [IU] via SUBCUTANEOUS
  Filled 2022-01-06 (×3): qty 0.3

## 2022-01-06 MED ORDER — LACTATED RINGERS IV BOLUS
1000.0000 mL | Freq: Once | INTRAVENOUS | Status: AC
  Administered 2022-01-06: 1000 mL via INTRAVENOUS

## 2022-01-06 MED ORDER — SODIUM ZIRCONIUM CYCLOSILICATE 10 G PO PACK
10.0000 g | PACK | Freq: Every day | ORAL | Status: AC
  Administered 2022-01-06 – 2022-01-07 (×2): 10 g via ORAL
  Filled 2022-01-06 (×3): qty 1

## 2022-01-06 MED ORDER — PANTOPRAZOLE SODIUM 40 MG PO TBEC
80.0000 mg | DELAYED_RELEASE_TABLET | Freq: Every day | ORAL | Status: DC
Start: 2022-01-06 — End: 2022-01-14
  Administered 2022-01-06 – 2022-01-14 (×9): 80 mg via ORAL
  Filled 2022-01-06 (×9): qty 2

## 2022-01-06 MED ORDER — ALPRAZOLAM 0.5 MG PO TABS
0.5000 mg | ORAL_TABLET | Freq: Two times a day (BID) | ORAL | Status: DC
  Administered 2022-01-06 – 2022-01-08 (×5): 0.5 mg via ORAL
  Filled 2022-01-06: qty 2
  Filled 2022-01-06 (×3): qty 1
  Filled 2022-01-06: qty 2
  Filled 2022-01-06: qty 1

## 2022-01-06 MED ORDER — CLOPIDOGREL BISULFATE 75 MG PO TABS
75.0000 mg | ORAL_TABLET | Freq: Every day | ORAL | Status: DC
  Administered 2022-01-07 – 2022-01-14 (×8): 75 mg via ORAL
  Filled 2022-01-06 (×8): qty 1

## 2022-01-06 MED ORDER — HEPARIN SODIUM (PORCINE) 5000 UNIT/ML IJ SOLN
5000.0000 [IU] | Freq: Three times a day (TID) | INTRAMUSCULAR | Status: DC
Start: 2022-01-06 — End: 2022-01-13
  Administered 2022-01-06 – 2022-01-13 (×20): 5000 [IU] via SUBCUTANEOUS
  Filled 2022-01-06 (×20): qty 1

## 2022-01-06 MED ORDER — BUPROPION HCL ER (SR) 150 MG PO TB12
150.0000 mg | ORAL_TABLET | Freq: Two times a day (BID) | ORAL | Status: DC
  Administered 2022-01-06 – 2022-01-14 (×16): 150 mg via ORAL
  Filled 2022-01-06 (×17): qty 1

## 2022-01-06 MED ORDER — LACTATED RINGERS IV SOLN: INTRAVENOUS | Status: AC

## 2022-01-06 MED ORDER — INSULIN ASPART 100 UNIT/ML IJ SOLN
0.0000 [IU] | INTRAMUSCULAR | Status: DC
  Administered 2022-01-06 (×2): 3 [IU] via SUBCUTANEOUS
  Administered 2022-01-07: 4 [IU] via SUBCUTANEOUS
  Administered 2022-01-07 (×3): 3 [IU] via SUBCUTANEOUS
  Administered 2022-01-08: 15 [IU] via SUBCUTANEOUS
  Administered 2022-01-08 (×3): 7 [IU] via SUBCUTANEOUS
  Administered 2022-01-09 (×2): 4 [IU] via SUBCUTANEOUS
  Administered 2022-01-09: 11 [IU] via SUBCUTANEOUS
  Administered 2022-01-09: 7 [IU] via SUBCUTANEOUS
  Administered 2022-01-09: 4 [IU] via SUBCUTANEOUS

## 2022-01-06 MED ORDER — ASPIRIN EC 81 MG PO TBEC
81.0000 mg | DELAYED_RELEASE_TABLET | Freq: Every day | ORAL | Status: DC
  Administered 2022-01-07 – 2022-01-14 (×8): 81 mg via ORAL
  Filled 2022-01-06 (×8): qty 1

## 2022-01-06 MED ORDER — VANCOMYCIN HCL IN DEXTROSE 1-5 GM/200ML-% IV SOLN
1000.0000 mg | Freq: Once | INTRAVENOUS | Status: AC
  Administered 2022-01-06: 1000 mg via INTRAVENOUS
  Filled 2022-01-06: qty 200

## 2022-01-06 MED ORDER — SODIUM CHLORIDE 0.9 % IV SOLN
2.0000 g | Freq: Two times a day (BID) | INTRAVENOUS | Status: AC
  Administered 2022-01-07 – 2022-01-09 (×6): 2 g via INTRAVENOUS
  Filled 2022-01-06 (×6): qty 2

## 2022-01-06 MED ORDER — ACETAMINOPHEN 325 MG PO TABS
650.0000 mg | ORAL_TABLET | Freq: Four times a day (QID) | ORAL | Status: DC | PRN
  Administered 2022-01-08 – 2022-01-09 (×2): 650 mg via ORAL
  Filled 2022-01-06 (×2): qty 2

## 2022-01-06 MED ORDER — VANCOMYCIN VARIABLE DOSE PER UNSTABLE RENAL FUNCTION (PHARMACIST DOSING): Status: DC

## 2022-01-06 MED ORDER — ALPRAZOLAM 0.25 MG PO TABS: 0.5000 mg | ORAL_TABLET | Freq: Two times a day (BID) | ORAL | Status: DC | PRN

## 2022-01-06 MED ORDER — INSULIN ASPART 100 UNIT/ML IV SOLN
5.0000 [IU] | Freq: Once | INTRAVENOUS | Status: AC
  Administered 2022-01-06: 5 [IU] via INTRAVENOUS

## 2022-01-06 MED ORDER — DEXTROSE 50 % IV SOLN
1.0000 | Freq: Once | INTRAVENOUS | Status: AC
  Administered 2022-01-06: 50 mL via INTRAVENOUS
  Filled 2022-01-06: qty 50

## 2022-01-06 MED ORDER — VANCOMYCIN HCL 1500 MG/300ML IV SOLN
1500.0000 mg | Freq: Once | INTRAVENOUS | Status: AC
  Administered 2022-01-07: 1500 mg via INTRAVENOUS
  Filled 2022-01-06: qty 300

## 2022-01-06 MED ORDER — PIPERACILLIN-TAZOBACTAM 3.375 G IVPB 30 MIN
3.3750 g | Freq: Once | INTRAVENOUS | Status: AC
  Administered 2022-01-06: 3.375 g via INTRAVENOUS
  Filled 2022-01-06: qty 50

## 2022-01-06 NOTE — ED Triage Notes (Signed)
Pt BIB EMS due to a fall yesterday. Pt has a hx of TIA and NSTEMI. Pt is not on any blood thinners. Pt has been having bilateral weakness. Pt is hypotensive, pale, and clammy. Pt did not fall today, just weak, Pt is axox4.

## 2022-01-06 NOTE — Consult Note (Signed)
Reason for Consult:Left foot infection Referring Physician: Virgina NorfolkAdam Curatolo Time called: 1004 Time at bedside: 1130   Joseph [name] Surette is an 51 y.o. male.  HPI: Aurther Lofterry comes in with weakness and confusion. He has recently undergone TMT amputation by Dr. Lajoyce Cornersuda. He's been on doxycycline for better than 2 weeks now and the foot does not seem to be getting any better. Yesterday he had generalized weakness and confusion and fell and has been unable to walk since.  Past Medical History:  Diagnosis Date   Aneurysm of ascending aorta 09/14/2021   Anxiety    Arthritis    "knees, shoulders, hips, ankles" (07/29/2016)   Asthma    Bicuspid aortic valve 09/14/2021   CAD (coronary artery disease)    a. 2017: s/p BMS to distal Cx; b. LHC 05/30/2019: 80% mid, distal RCA s/p DES, 30% narrowing of d LM, widely patent LAD [name]/ luminal irregularities, widely patent stent in dCX  [name]/ 90+% stenosis distal to stent beofre small trifurcating obtuse marginal (potentially area of restenosis)   Chronic bronchitis (HCC)    Chronic ulcer of right great toe (HCC) 09/19/2017   COPD (chronic obstructive pulmonary disease) (HCC)    Depression    DVT (deep venous thrombosis) (HCC)    GERD (gastroesophageal reflux disease)    Hyperlipidemia    Hypertension    Myocardial infarction (HCC) 1996   "light one"   PE (pulmonary embolism) 04/2013   On chronic Xarelto   Peripheral nerve disease    Pneumonia "several times"   Sleep apnea    Type II diabetes mellitus (HCC)     Past Surgical History:  Procedure Laterality Date   ABDOMINAL AORTOGRAM [name]/LOWER EXTREMITY N/A 10/10/2020   Procedure: ABDOMINAL AORTOGRAM [name]/LOWER EXTREMITY;  Surgeon: Sherren KernsFields, Charles E, MD;  [facility]: [facility];  Service: Cardiovascular;  Laterality: N/A;   AMPUTATION Right 09/21/2017   Procedure: RIGHT GREAT TOE AMPUTATION, POSSIBLE VAC;  Surgeon: Tarry KosXu, Naiping M, MD;  Location: [facility] OR;  Service: Orthopedics;  Laterality: Right;   AMPUTATION Left  08/13/2020   Procedure: LEFT FOOT 4TH RAY AMPUTATION;  Surgeon: Nadara Mustarduda, Marcus V, MD;  [facility];  Service: Orthopedics;  Laterality: Left;   AMPUTATION Left 11/20/2021   Procedure: LEFT TRANSMETATARSAL AMPUTATION;  Surgeon: Nadara Mustarduda, Marcus V, MD;  [facility];  Service: Orthopedics;  Laterality: Left;   AORTOGRAM Bilateral 03/13/2021   Procedure: ABDOMINAL AORTOGRAM WITH Left LOWER EXTREMITY RUNOFF;  Surgeon: Leonie DouglasHawken, Thomas N, MD;  [facility];  Service: Vascular;  Laterality: Bilateral;   CARDIAC CATHETERIZATION  2006   CARDIAC CATHETERIZATION  1996   "@ [facility]; when I had my heart attack"   CARDIAC CATHETERIZATION N/A 07/29/2016   Procedure: Left Heart Cath and Coronary Angiography;  Surgeon: Runell GessJonathan J Berry, MD;  [facility];  Service: Cardiovascular;  Laterality: N/A;   CARDIAC CATHETERIZATION N/A 07/29/2016   Procedure: Coronary Stent Intervention;  Surgeon: Runell GessJonathan J Berry, MD;  [facility];  Service: Cardiovascular;  Laterality: N/A;   CARPAL TUNNEL RELEASE Bilateral    CORONARY ANGIOPLASTY WITH STENT PLACEMENT  07/29/2016   CORONARY ARTERY BYPASS GRAFT N/A 07/24/2020   Procedure: CORONARY ARTERY BYPASS GRAFTING (CABG), ON PUMP, TIMES FOUR, USING LEFT INTERNAL MAMMARY ARTERY AND ENDOSCOPICALLY HARVESTED RIGHT GREATER SAPHENOUS VEIN;  Surgeon: Corliss SkainsLightfoot, Harrell O, MD;  Location: [facility];  Service: Open Heart Surgery;  Laterality: N/A;  FLOW TAC   CORONARY STENT INTERVENTION N/A 05/30/2019   Procedure: CORONARY  STENT INTERVENTION;  Surgeon: Lyn Records, MD;  [facility] CV [facility];  Service: Cardiovascular;  Laterality: N/A;   CORONARY STENT INTERVENTION N/A 11/02/2019   Procedure: CORONARY STENT INTERVENTION;  Surgeon: Iran Ouch, MD;  [facility];  Service: Cardiovascular;  Laterality: N/A;   ESOPHAGOGASTRODUODENOSCOPY N/A 09/22/2017   Procedure: ESOPHAGOGASTRODUODENOSCOPY (EGD);  Surgeon: Meryl Dare, MD;  Location:  [facility] ENDOSCOPY;  Service: Endoscopy;  Laterality: N/A;   INTRAVASCULAR PRESSURE WIRE/FFR STUDY N/A 07/22/2020   Procedure: INTRAVASCULAR PRESSURE WIRE/FFR STUDY;  Surgeon: Marykay Lex, MD;  Location: [facility] INVASIVE CV LAB;  Service: Cardiovascular;  Laterality: N/A;   KNEE ARTHROSCOPY Bilateral    "2 on left; 1 on the right"   LEFT HEART CATH AND CORONARY ANGIOGRAPHY N/A 11/02/2019   Procedure: LEFT HEART CATH AND CORONARY ANGIOGRAPHY;  Surgeon: Iran Ouch, MD;  [facility];  Service: Cardiovascular;  Laterality: N/A;   LEFT HEART CATH AND CORONARY ANGIOGRAPHY N/A 07/22/2020   Procedure: LEFT HEART CATH AND CORONARY ANGIOGRAPHY;  Surgeon: Marykay Lex, MD;  Location[facility] INVASIVE CV LAB;  Service: Cardiovascular;  Laterality: N/A;   LEFT HEART CATH AND CORS/GRAFTS ANGIOGRAPHY N/A 08/17/2021   Procedure: LEFT HEART CATH AND CORS/GRAFTS ANGIOGRAPHY;  Surgeon: Yvonne Kendall, MD;  [facility];  Service: Cardiovascular;  Laterality: N/A;   PERIPHERAL VASCULAR INTERVENTION Left 10/11/2020   popliteal and SFA stent placement    PERIPHERAL VASCULAR INTERVENTION Left 10/10/2020   Procedure: PERIPHERAL VASCULAR INTERVENTION;  Surgeon: Sherren Kerns, MD;  [facility];  Service: Cardiovascular;  Laterality: Left;   RIGHT/LEFT HEART CATH AND CORONARY ANGIOGRAPHY N/A 05/30/2019   Procedure: RIGHT/LEFT HEART CATH AND CORONARY ANGIOGRAPHY;  Surgeon: Lyn Records, MD;  [facility];  Service: Cardiovascular;  Laterality: N/A;   SHOULDER OPEN ROTATOR CUFF REPAIR Bilateral    TEE WITHOUT CARDIOVERSION N/A 07/24/2020   Procedure: TRANSESOPHAGEAL ECHOCARDIOGRAM (TEE);  Surgeon: Corliss Skains, MD;  Location: [facility] OR;  Service: Open Heart Surgery;  Laterality: N/A;    Family History  Problem Relation Age of Onset   Hypertension Brother    Hypertension Father    Diabetes Other    Hyperlipidemia Other     Social History:  reports  that he quit smoking about 17 months ago. His smoking use included cigarettes. He has a 34.00 pack-year smoking history. He has quit using smokeless tobacco.  His smokeless tobacco use included snuff and chew. He reports current alcohol use. He reports that he does not use drugs.  Allergies:  Allergies  Allergen Reactions   Sulfa Antibiotics Other (See Comments)    Headaches     Medications: I have reviewed the patient's current medications.  Results for orders placed or performed during the hospital encounter of 01/06/22 (from the past 48 hour(s))  Lactic acid, plasma     Status: Abnormal   Collection Time: 01/06/22  9:49 AM  Result Value Ref Range   Lactic Acid, Venous 3.0 (HH) 0.5 - 1.9 mmol/L    Comment: CRITICAL RESULT CALLED TO, READ BACK BY AND VERIFIED WITH: Lanice Schwab, RN 1053 01/06/22 L. KLAR Performed at [facility], [address] [place], [name] [address]   Comprehensive metabolic panel     Status: Abnormal   Collection Time: 01/06/22  9:49 AM  Result Value Ref Range   Sodium 137 135 - 145 mmol/L   Potassium 6.3 (HH) 3.5 -  5.1 mmol/L    Comment: NO VISIBLE HEMOLYSIS CRITICAL RESULT CALLED TO, READ BACK BY AND VERIFIED WITH: Lerry Paterson. VELKANS, RN 1056 01/06/22 L. KLAR    Chloride 104 98 - 111 mmol/L   CO2 25 22 - 32 mmol/L   Glucose, Bld 170 (H) 70 - 99 mg/dL    Comment: Glucose reference range applies only to samples taken after fasting for at least 8 hours.   BUN 45 (H) 6 - 20 mg/dL   Creatinine, Ser 1.612.22 (H) 0.61 - 1.24 mg/dL   Calcium 8.6 (L) 8.9 - 10.3 mg/dL   Total Protein 7.1 6.5 - 8.1 g/dL   Albumin 2.4 (L) 3.5 - 5.0 g/dL   AST 22 15 - 41 U/L   ALT 13 0 - 44 U/L   Alkaline Phosphatase 88 38 - 126 U/L   Total Bilirubin 0.4 0.3 - 1.2 mg/dL   GFR, Estimated 36 (L) >60 mL/min    Comment: (NOTE) Calculated using the CKD-EPI Creatinine Equation (2021)    Anion gap 8 5 - 15    Comment: Performed at [facility], [address] [place], [place]  [address]  CBC WITH DIFFERENTIAL     Status: Abnormal   Collection Time: 01/06/22  9:49 AM  Result Value Ref Range   WBC 12.3 (H) 4.0 - 10.5 K/uL   RBC 4.35 4.22 - 5.81 MIL/uL   Hemoglobin 10.6 (L) 13.0 - 17.0 g/dL   HCT 54.036.6 (L) 98.139.0 - 19.152.0 %   MCV 84.1 80.0 - 100.0 fL   MCH 24.4 (L) 26.0 - 34.0 pg   MCHC 29.0 (L) 30.0 - 36.0 g/dL   RDW 47.816.3 (H) 29.511.5 - 62.115.5 %   Platelets 418 (H) 150 - 400 K/uL   nRBC 0.0 0.0 - 0.2 %   Neutrophils Relative % 70 %   Neutro Abs 8.7 (H) 1.7 - 7.7 K/uL   Lymphocytes Relative 18 %   Lymphs Abs 2.2 0.7 - 4.0 K/uL   Monocytes Relative 9 %   Monocytes Absolute 1.0 0.1 - 1.0 K/uL   Eosinophils Relative 1 %   Eosinophils Absolute 0.1 0.0 - 0.5 K/uL   Basophils Relative 1 %   Basophils Absolute 0.1 0.0 - 0.1 K/uL   Immature Granulocytes 1 %   Abs Immature Granulocytes 0.10 (H) 0.00 - 0.07 K/uL    Comment: Performed at [facility], [address] [place], [place] [address]  Protime-INR     Status: None   Collection Time: 01/06/22  9:49 AM  Result Value Ref Range   Prothrombin Time 14.4 11.4 - 15.2 seconds   INR 1.1 0.8 - 1.2    Comment: (NOTE) INR goal varies based on device and disease states. Performed at [facility], [address] [place], [place] [address]   APTT     Status: None   Collection Time: 01/06/22  9:49 AM  Result Value Ref Range   aPTT 36 24 - 36 seconds    Comment: Performed at [facility], [address] [place], [place] [address]  Troponin I (High Sensitivity)     Status: Abnormal   Collection Time: 01/06/22  9:49 AM  Result Value Ref Range   Troponin I (High Sensitivity) 37 (H) <18 ng/L    Comment: (NOTE) Elevated high sensitivity troponin I (hsTnI) values and significant  changes across serial measurements may suggest ACS but many other  chronic and acute conditions are known to elevate hsTnI  results.  Refer to the Links section for chest pain algorithms and additional  guidance. Performed at [facility], [address], [place], [name] [address]   POC CBG, ED     Status: Abnormal   Collection Time: 01/06/22  9:51 AM  Result Value Ref Range   Glucose-Capillary 173 (H) 70 - 99 mg/dL    Comment: Glucose reference range applies only to samples taken after fasting for at least 8 hours.  I-Stat venous blood gas, [facility] ED)     Status: Abnormal   Collection Time: 01/06/22 10:35 AM  Result Value Ref Range   pH, Ven 7.386 7.250 - 7.430   pCO2, Ven 43.9 (L) 44.0 - 60.0 mmHg   pO2, Ven 95.0 (H) 32.0 - 45.0 mmHg   Bicarbonate 26.3 20.0 - 28.0 mmol/L   TCO2 28 22 - 32 mmol/L   O2 Saturation 97.0 %   Acid-Base Excess 1.0 0.0 - 2.0 mmol/L   Sodium 138 135 - 145 mmol/L   Potassium 6.8 (HH) 3.5 - 5.1 mmol/L   Calcium, Ion 1.13 (L) 1.15 - 1.40 mmol/L   HCT 32.0 (L) 39.0 - 52.0 %   Hemoglobin 10.9 (L) 13.0 - 17.0 g/dL   Sample type VENOUS    Comment NOTIFIED PHYSICIAN     DG Chest 1 View  Result Date: 01/06/2022 CLINICAL DATA:  Fall, chest pain EXAM: CHEST  1 VIEW COMPARISON:  Chest x-ray 11/17/2021 FINDINGS: Heart size and mediastinal contours are within normal limits. Cardiac surgical changes and median sternotomy wires. No suspicious pulmonary opacities identified. No pleural effusion or pneumothorax visualized. No acute osseous abnormality appreciated. IMPRESSION: No acute intrathoracic process identified. Electronically Signed   By: Jannifer Hick M.D.   On: 01/06/2022 10:56   CT HEAD WO CONTRAST ( )  Result Date: 01/06/2022 CLINICAL DATA:  Fall, head trauma EXAM: CT HEAD WITHOUT CONTRAST TECHNIQUE: Contiguous axial images were obtained from the base of the skull through the vertex without intravenous contrast. RADIATION DOSE REDUCTION: This exam was performed according to the departmental dose-optimization program which includes automated exposure control, adjustment of the mA and/or kV according to patient size and/or use of iterative reconstruction technique. COMPARISON:  09/18/2017  FINDINGS: Brain: No evidence of acute infarction, hemorrhage, hydrocephalus, extra-axial collection or mass lesion/mass effect. Vascular: No hyperdense vessel or unexpected calcification. Skull: Normal. Negative for fracture or focal lesion. Sinuses/Orbits: No acute finding. Other: Moderately severe diffuse pansinus mucosal thickened with polypoid mucosal disease in the right maxillary sinus. Difficult to exclude acute on chronic sinusitis. Mastoids are clear. IMPRESSION: 1. No acute intracranial abnormality by noncontrast CT. 2. Pansinus disease as above. Electronically Signed   By: Judie Petit.  Shick M.D.   On: 01/06/2022 10:29   DG Foot Complete Left  Result Date: 01/06/2022 CLINICAL DATA:  Foot pain EXAM: LEFT FOOT - COMPLETE 3+ VIEW COMPARISON:  Left foot x-ray 11/17/2021 FINDINGS: Interval distal transmetatarsal surgical amputation. There is heterogeneous destructive appearance of the distal stumps of the second through fourth metatarsals with adjacent periosteal reaction. Marked soft tissue swelling of the foot stump. IMPRESSION: Findings suggestive of osteomyelitis involving the distal metatarsal stumps as described. Consider further evaluation with MRI. Electronically Signed   By: Jannifer Hick M.D.   On: 01/06/2022 10:54   DG Hip Unilat With Pelvis 2-3 Views Left  Result Date: 01/06/2022 CLINICAL DATA:  Fall, hip pain EXAM: DG HIP (WITH OR WITHOUT PELVIS) 2-3V LEFT COMPARISON:  None. FINDINGS: No acute fracture or dislocation identified. Mild  narrowing of the hip joint space with acetabular subchondral sclerosis. IMPRESSION: No acute osseous abnormality identified. Electronically Signed   By: Jannifer Hick M.D.   On: 01/06/2022 10:55    Review of Systems  Constitutional:  Positive for chills, diaphoresis and fever.  HENT:  Negative for ear discharge, ear pain, hearing loss and tinnitus.   Eyes:  Negative for photophobia and pain.  Respiratory:  Negative for cough and shortness of breath.    Cardiovascular:  Negative for chest pain.  Gastrointestinal:  Negative for abdominal pain, nausea and vomiting.  Genitourinary:  Negative for dysuria, flank pain, frequency and urgency.  Musculoskeletal:  Negative for back pain, myalgias and neck pain.  Neurological:  Positive for weakness. Negative for dizziness and headaches.  Hematological:  Does not bruise/bleed easily.  Psychiatric/Behavioral:  The patient is not nervous/anxious.   Blood pressure (!) 88/72, pulse 86, temperature 98.7 F (37.1 C), temperature source Oral, resp. rate 17, height 6\' 5"  (1.956 m), weight (!) 196.9 kg, SpO2 94 %. Physical Exam Constitutional:      General: He is not in acute distress.    Appearance: He is well-developed. He is not diaphoretic.  HENT:     Head: Normocephalic and atraumatic.  Eyes:     General: No scleral icterus.       Right eye: No discharge.        Left eye: No discharge.     Conjunctiva/sclera: Conjunctivae normal.  Cardiovascular:     Rate and Rhythm: Normal rate and regular rhythm.  Pulmonary:     Effort: Pulmonary effort is normal. No respiratory distress.  Musculoskeletal:     Cervical back: Normal range of motion.  Feet:     Comments: Left foot: Severe edema, erythema midfoot with failure to close lateral portion of incision. No expressible discharge but wound edge appears moist and purulent. Skin:    General: Skin is warm and dry.  Neurological:     Mental Status: He is alert.  Psychiatric:        Mood and Affect: Mood normal.        Behavior: Behavior normal.    Assessment/Plan: Left foot infection -- Will have medicine admit. Based on appearance of foot and x-ray today will likely need BKA, likely Friday. Dr. Tuesday to evaluate tomorrow.    [name], PA-C Orthopedic Surgery [phone] 01/06/2022, 11:45 AM

## 2022-01-06 NOTE — H&P (Addendum)
[facility] Admission History and Physical Service Pager: [phone]  Patient name: Joseph [name] Medical record number: [id number] Date of birth: October 30, 1971 Age: 51 y.o. Gender: male  Primary Care Provider: [name], NP Consultants: Ortho Code Status: FULL  Preferred Emergency Contact: Mayra Neer [phone] Wife  Chief Complaint: Weakness, fall  Assessment and Plan: Joseph [name] is a 51 y.o. male presenting with weakness and fall . PMH is significant for CAD s/p CABG x4, PAD, PE, T2DM, HTN, HLD, left toe amputation, COPD, GERD  Fall with concern for possible syncope Patient although a poor historian reported falling this morning which was collaborated by his wife. Fall was unwitnessed and patient report he fall was likely due to weakness of all extremities. He is unsure of loss of consciousness. Wife denies witnessing the fall but report he was responsive when she saw him making it less likely seizure due to absence of post ictal state. With report of incoherent speech and description of muscle weakness there is consideration for possible TIA. Head CT on admission was negative and no focal neurologic deficit on exam. Will monitor closely with low threshold to consult neurology.  Patient reported having exertional chest pain that radiates to his left arm and had discussion with wife about chest pain. His description was inconsistent as wife reports that he didn't complain of chest pain prior to fall.  Given his extensive cardiac history with CAD s/p CABG and PAD there are concerns for possible syncope due to cardiac causes.  His troponin was 37 and EKG on admission showed sinus rhythm with no ST changes. Most recent Echo 08/16/2021 w/ EF of 55% G1DD, abnormal aortic valve with calcification and thickening. On exam he appears euvolemic with normal cardiac exam , RRR and no murmurs. Will start patient on continuous cardiac monitoring. Pulmonary exam was  normal, no  pleuritic chest pain. Wells score was 3 with moderate risk of PE. Given past history of PE will obtain D-Dimer and consider CTA with worsening symptoms. Also consider repeating echo.  -Admit to Med telemetry by FPTS, attending Dr. Josiah Lobo -Continuous cardiac monitoring -Obtain D-dimer -Consider echocardiogram, CTA -Continue routine vital per floor -Heparin for DVT prophylaxis  Left foot infection   Sepsis  Patient presented with worsening weakness and complaint of confusion. Recently had a TMT 2 weeks ago and currently taking vancomycin since surgery. On admission he was afebrile but had soft BP. His Lactic acid was 3.0 and improved to 2.2  after fluid resuscitation. Other notable lab findings on admission include NA 137, K 6.3, BUN 45, creatinine 2.2 and calcium 8.6, troponin 37.  CBC showed WBC of 12.3, Hgb 10.6 and platelet of 418.  Left foot XR showed heterogeneous destruction of on the distal stumps of the second through fourth metatarsal with adjacent periosteal reactions and marked soft tissue swellings suggestive of osteomyelitis.  Given patient's initial presentation with mild BP, leukocytosis, elevated lactic acid, and disease source with osteomyelitis of left foot code sepsis was initiated.  Orthopedic surgery was consulted who anticipate possible BKA with plan for continued assessment. Recommendation will be appreciated. In the ED patient received zosyn and vancomycin for antibiotic coverage. Will consider other antibiotic coverage for patient given AKI and follow up with blood culture. - Orthopedic consulted in the ER, appreciate recommendations - AM CBC/BMP - Monitor for fever - Continuous cardiac monitoring -Consider alternative antibiotics to replace Vanc due to AKI -Tylenol 650 milligrams every 6 hours as needed -Follow-up blood -Follow-up D-dimer -  Follow-up urine culture -Continue maintenance IV LR at 159ml/hr  Covid 19 positive Patient had a positive home test on  January 6. He reports having congestion and generalized fatigue.  He still endorses generalized fatigue and this could have attributed to his fall. Otherwise he is satting well on RA without an increased WOB. Per wife he he was treated by his PCP with 2 medication which wife cannot recall.  He recently completed his treatment.  On admission patient tested positive for COVID-19. Likely continued sequelae.  -Monitor VS -AM CBC/BMP  T2DM HbA1c 10/2021 was 10.0 and blood glucose on admission was last 170 . Home medication include Victoza 1.8 mg daily and metformin 1000 mg twice daily -Hold metformin due to AKI  - Start semglee 30u  daily - rssI -CBGs q4h  -Heart healthy diet -Continue monitoring daily CBG  CAD s/p CABG   PAD Patient with history of CAD s/p CABG and PAD endorses having pressure like chest pain that radiates to left arm or dyspnea.  His EKG on admission showed sinus rhythm with no ST changes and chest x-ray was normal. Home medication includes Imdur 60mg  daily, aspirin 81 mg daily and Plavix and 5 mg daily -Continue ASA + plavix -Hold imdur for soft BPs  -Continuous cardiac monitoring  AKI On admission creatinine was 2.22 and baseline is ~1.45. Most likely prerenal attributed to possible sepsis and medication. He has received 3L since admission. Will continue to monitor creatinine with daily BMP laps. -Avoid nephrotoxic agent -Hold medication, metformin -Obtain BMP lab in the AM  Hyperkalemia Potassium on admission was 6.3 and patient was started on Lokelma 10 mg.  Repeat 5.9. EKG on admission shows sinus rhythm with no ST changes.  Patient denies having any palpitations at this time.  We will monitor electrolytes closely -Continue Lokelma -Continuous cardiac monitoring -Continue daily lab; BMP   HLD Last lipid panel was 4 months ago with  Cholesterol 189, triglycerides 316, HDL 31 and LDL 95. Home medication include atorvastatin 80 mg daily, vascepa 2 g BID.  -Continue  home medication  HTN In the ED patient was found to have soft BP 85/50 on admission. After fluid resuscitation patient improved to 105/68. Home medication include Imdur 60 mg daily and Coreg 3.125 mg twice daily. Will hold home medication and reconsider restarting with improved BP. -Holding home medication - follow routine vitals  Bipolar with Depression Disorder Patient home medication includes Lyrica, Wellbutrin and Xanax. -Continue lyrica and wellburtin -Hold xanax  GERD  Home medications include Protonix 80 mg - Continue home medication   COPD Patient has extensive history of smoking 2 packs daily for at least 30 years. He reports not smoking in over a year. He endorses exertional dyspnea and cough at baseline.  No crackles or wheezing on exam. Home medications include albuterol inhaler as needed.      FEN/GI: Heart healthy/carb modified Prophylaxis: Heparin  Disposition: Admit to Med Tele  History of Present Illness:  Joseph [name] is a 51 y.o. male presenting with weakness for 1 day and s/p fall yesterday. Stated he went to stand yesterday and fell flat on his face. He felt depleted of energy. Can not remember if he was dizzy or loss consciousness. But he does not remember the actual fall. He was told by his wife and felt the carpet.   Felt chest pain before he fell. Feels as if heart is racing. He states it felt like an elephant was standing on his chest but  was gone when he got up. He fell 3 minutes after feeling unwell. Sometimes has chest pain with walking.  Patient is a poor historian and reports discussing chest pain with his wife.  Speaking to wife on the phone, she denies having a conversation with patient about chest pain.  She said he didn't complained about chest pain prior to his fall today however mentioned chest pain to the fire department after the arrival.   Pain in knees, back, feet which is chronic.   Tested positive for COVID 2 week ago.   Hx of tobacco use  2pk per day; quit last year 12-47.  No recent ETOH; previous hx of heavy drinking in 90s, last 1 year ago   Review Of Systems: Per HPI with the following additions:   Review of Systems  Constitutional:  Negative for chills and fever.  Cardiovascular:  Positive for chest pain.  Musculoskeletal:  Positive for arthralgias and back pain.  Neurological:  Positive for weakness.    Patient Active Problem List   Diagnosis Date Noted   Bipolar disorder with depression ([place])    Severe sepsis ([place]) 11/18/2021   Chest pain 11/18/2021   Dyspnea 11/18/2021   Wound infection    Ulcer of left foot with necrosis of bone ([place])    Bicuspid aortic valve 09/14/2021   Aneurysm of ascending aorta 09/14/2021   Hyperkalemia 08/20/2021   DM (diabetes mellitus) with peripheral vascular complication ([place]) [id number]   Atypical chest pain 07/17/2021   Hyponatremia 07/17/2021   Leg wound, left 07/17/2021   Essential hypertension 07/17/2021   Acute hyperglycemia 07/09/2021   Left leg cellulitis 04/11/2021   Cellulitis of left leg 03/05/2021   Cellulitis 02/18/2021   PAD (peripheral artery disease) ([place]) 10/10/2020   Osteomyelitis of fourth toe of right foot ([place])    S/P CABG x 4 07/24/2020   Non-ST elevation (NSTEMI) myocardial infarction ([place]) 07/22/2020   Acute on chronic diastolic heart failure ([place]) 07/22/2020   Hypotension 07/18/2020   Left leg swelling 07/18/2020   Elevated troponin 07/18/2020   AKI (acute kidney injury) ([place]) 07/18/2020   Elevated brain natriuretic peptide (BNP) level 07/18/2020   GERD (gastroesophageal reflux disease) 07/18/2020   Unstable angina ([place]) 11/01/2019   Edema 06/13/2019   Leukocytosis 05/31/2019   Smoker 03/08/2019   Chronic pain 03/08/2019   Chronic back pain 02/16/2019   Deep venous thrombosis ([place]) 02/16/2019   Obesity, Class III, BMI 40-49.9 (morbid obesity) ([place]) 02/16/2019   Peripheral nerve disease 02/16/2019   COPD (chronic obstructive pulmonary  disease) ([place]) 01/22/2019   Anxiety disorder 03/26/2018   Open wound of toe 10/12/2017   Sinusitis 10/12/2017   Abdominal pain    Loss of weight    Diabetic foot infection ([place]) 09/16/2017   Uncontrolled type 2 diabetes mellitus with hyperglycemia, with long-term current use of insulin ([place]) 09/16/2017   Cellulitis of right foot    Chronic ulcer of great toe of right foot ([place]) 09/09/2017   Recurrent chest pain 07/29/2016   Hyperglycemia    Insulin dependent type 2 diabetes mellitus ([place]) 04/29/2015   OSA (obstructive sleep apnea) 05/08/2013   History of pulmonary embolus (PE) 04/27/2013   CAD -S/P PCI 11/02/19  09/10/2008   Hyperlipidemia 09/09/2008    Past Medical History: Past Medical History:  Diagnosis Date   Aneurysm of ascending aorta 09/14/2021   Anxiety    Arthritis    "knees, shoulders, hips, ankles" (07/29/2016)   Asthma    Bicuspid aortic valve 09/14/2021  CAD (coronary artery disease)    a. 2017: s/p BMS to distal Cx; b. LHC 05/30/2019: 80% mid, distal RCA s/p DES, 30% narrowing of d LM, widely patent LAD w/ luminal irregularities, widely patent stent in [place]  w/ 90+% stenosis distal to stent beofre small trifurcating obtuse marginal (potentially area of restenosis)   Chronic bronchitis (HCC)    Chronic ulcer of right great toe ([place]) 09/19/2017   COPD (chronic obstructive pulmonary disease) (H[facility])    Depression    DVT (deep venous thrombosis) (HCC)    GERD (gastroesophageal reflux disease)    Hyperlipidemia    Hypertension    Myocardial infarction ([place]) 1996   "light one"   PE (pulmonary embolism) 04/2013   On chronic Xarelto   Peripheral nerve disease    Pneumonia "several times"   Sleep apnea    Type II diabetes mellitus ([place])     Past Surgical History: Past Surgical History:  Procedure Laterality Date   ABDOMINAL AORTOGRAM W/LOWER EXTREMITY N/A 10/10/2020   Procedure: ABDOMINAL AORTOGRAM W/LOWER EXTREMITY;  Surgeon: Elam Dutch, MD;  Location: [place] [facility];  Service: Cardiovascular;  Laterality: N/A;   AMPUTATION Right 09/21/2017   Procedure: RIGHT GREAT TOE AMPUTATION, POSSIBLE VAC;  Surgeon: Leandrew Koyanagi, MD;  Location: [place];  Service: Orthopedics;  Laterality: Right;   AMPUTATION Left 08/13/2020   Procedure: LEFT FOOT 4TH RAY AMPUTATION;  Surgeon: Newt Minion, MD;  Location: [place];  Service: Orthopedics;  Laterality: Left;   AMPUTATION Left 11/20/2021   Procedure: LEFT TRANSMETATARSAL AMPUTATION;  Surgeon: Newt Minion, MD;  Location: [place];  Service: Orthopedics;  Laterality: Left;   AORTOGRAM Bilateral 03/13/2021   Procedure: ABDOMINAL AORTOGRAM WITH Left LOWER EXTREMITY RUNOFF;  Surgeon: Cherre Robins, MD;  Location[facility];  Service: Vascular;  Laterality: Bilateral;   CARDIAC CATHETERIZATION  2006   [place]   "@ [facility]; when I had my heart attack"   CARDIAC CATHETERIZATION N/A 07/29/2016   Procedure: Left Heart Cath and Coronary Angiography;  Surgeon: Lorretta Harp, MD;  Location: [place] [facility];  Service: Cardiovascular;  Laterality: N/A;   CARDIAC CATHETERIZATION N/A 07/29/2016   Procedure: Coronary Stent Intervention;  Surgeon: Lorretta Harp, MD;  Location: [place] [facility];  Service: Cardiovascular;  Laterality: N/A;   CARPAL TUNNEL RELEASE Bilateral    CORONARY ANGIOPLASTY WITH STENT PLACEMENT  07/29/2016   CORONARY ARTERY BYPASS GRAFT N/A 07/24/2020   Procedure: CORONARY ARTERY BYPASS GRAFTING (CABG), ON PUMP, TIMES FOUR, USING LEFT INTERNAL MAMMARY ARTERY AND ENDOSCOPICALLY HARVESTED RIGHT GREATER SAPHENOUS VEIN;  Surgeon: Lajuana Matte, MD;  Location: [place];  Service: Open Heart Surgery;  Laterality: N/A;  FLOW TAC   CORONARY STENT INTERVENTION N/A 05/30/2019   Procedure: CORONARY STENT INTERVENTION;  Surgeon: Belva Crome, MD;  Location: [place] [facility];  Service: Cardiovascular;  Laterality: N/A;   CORONARY STENT INTERVENTION N/A 11/02/2019   Procedure: CORONARY STENT  INTERVENTION;  Surgeon: Wellington Hampshire, MD;  Location: [place] [facility];  Service: Cardiovascular;  Laterality: N/A;   ESOPHAGOGASTRODUODENOSCOPY N/A 09/22/2017   Procedure: ESOPHAGOGASTRODUODENOSCOPY (EGD);  Surgeon: Ladene Artist, MD;  Location: [facility];  Service: Endoscopy;  Laterality: N/A;   INTRAVASCULAR PRESSURE WIRE/FFR STUDY N/A 07/22/2020   Procedure: INTRAVASCULAR PRESSURE WIRE/FFR STUDY;  Surgeon: Leonie Man, MD;  Location: [place] [facility];  Service: Cardiovascular;  Laterality: N/A;   KNEE ARTHROSCOPY Bilateral    "  2 on left; 1 on the right"   LEFT HEART CATH AND CORONARY ANGIOGRAPHY N/A 11/02/2019   Procedure: LEFT HEART CATH AND CORONARY ANGIOGRAPHY;  Surgeon: Wellington Hampshire, MD;  Location: [place] [facility];  Service: Cardiovascular;  Laterality: N/A;   LEFT HEART CATH AND CORONARY ANGIOGRAPHY N/A 07/22/2020   Procedure: LEFT HEART CATH AND CORONARY ANGIOGRAPHY;  Surgeon: Leonie Man, MD;  Location: [place] [facility];  Service: Cardiovascular;  Laterality: N/A;   LEFT HEART CATH AND CORS/GRAFTS ANGIOGRAPHY N/A 08/17/2021   Procedure: LEFT HEART CATH AND CORS/GRAFTS ANGIOGRAPHY;  Surgeon: Nelva Bush, MD;  Location: [place] [facility];  Service: Cardiovascular;  Laterality: N/A;   PERIPHERAL VASCULAR INTERVENTION Left 10/11/2020   popliteal and SFA stent placement    PERIPHERAL VASCULAR INTERVENTION Left 10/10/2020   Procedure: PERIPHERAL VASCULAR INTERVENTION;  Surgeon: Elam Dutch, MD;  Location: [place] [facility];  Service: Cardiovascular;  Laterality: Left;   RIGHT/LEFT HEART CATH AND CORONARY ANGIOGRAPHY N/A 05/30/2019   Procedure: RIGHT/LEFT HEART CATH AND CORONARY ANGIOGRAPHY;  Surgeon: Belva Crome, MD;  Location: [place] [facility];  Service: Cardiovascular;  Laterality: N/A;   SHOULDER OPEN ROTATOR CUFF REPAIR Bilateral    TEE WITHOUT CARDIOVERSION N/A 07/24/2020   Procedure: TRANSESOPHAGEAL ECHOCARDIOGRAM (TEE);  Surgeon: Lajuana Matte, MD;  Location: [place];  Service: Open Heart Surgery;  Laterality: N/A;    Social History: Social History   Tobacco Use   Smoking status: Former    Packs/day: 1.00    Years: 34.00    Pack years: 34.00    Types: Cigarettes    Quit date: 07/12/2020    Years since quitting: 1.4   Smokeless tobacco: Former    Types: Snuff, Chew  Vaping Use   Vaping Use: Never used  Substance Use Topics   Alcohol use: Yes    Comment: RARE   Drug use: No   Additional social history:   Please also refer to relevant sections of EMR.  Family History: Family History  Problem Relation Age of Onset   Hypertension Brother    Hypertension Father    Diabetes Other    Hyperlipidemia Other      Allergies and Medications: Allergies  Allergen Reactions   Sulfa Antibiotics Other (See Comments)    Headaches    No current facility-administered medications on file prior to encounter.   Current Outpatient Medications on File Prior to Encounter  Medication Sig Dispense Refill   albuterol (VENTOLIN HFA) 108 (90 Base) MCG/ACT inhaler Inhale 2 puffs into the lungs every 6 (six) hours as needed for wheezing or shortness of breath.     ALPRAZolam (XANAX) 1 MG tablet Take 0.5 tablets (0.5 mg total) by mouth at bedtime. 1 mg in the morning  2 mg at bedtime (Patient taking differently: Take 1 mg by mouth 2 (two) times daily as needed for anxiety or sleep.) 30 tablet 0   aspirin EC 81 MG tablet Take 81 mg by mouth daily. Swallow whole.     atorvastatin (LIPITOR) 80 MG tablet Take 1 tablet (80 mg total) by mouth daily at 6 PM. 30 tablet 2   buPROPion (WELLBUTRIN SR) 150 MG 12 hr tablet Take 1 tablet (150 mg total) by mouth 2 (two) times daily. 30 tablet 0   carvedilol (COREG) 3.125 MG tablet Take 3.125 mg by mouth 2 (two) times daily.     clopidogrel (PLAVIX) 75 MG tablet Take 1 tablet (75 mg total) by mouth  daily with breakfast. 30 tablet 3   doxycycline (VIBRA-TABS) 100 MG tablet Take 1 tablet (100 mg  total) by mouth 2 (two) times daily. 60 tablet 0   fluticasone (FLONASE) 50 MCG/ACT nasal spray Place 2 sprays into both nostrils daily.     icosapent Ethyl (VASCEPA) 1 g capsule Take 2 capsules (2 g total) by mouth 2 (two) times daily. 360 capsule 3   insulin aspart (NOVOLOG) 100 UNIT/ML FlexPen Inject 6 Units into the skin 3 (three) times daily with meals. Per sliding scale     insulin glargine (LANTUS) 100 UNIT/ML injection Inject 0.58 mLs (58 Units total) into the skin 2 (two) times daily. (Patient taking differently: Inject 60 Units into the skin 2 (two) times daily.) 10 mL 11   isosorbide mononitrate (IMDUR) 60 MG 24 hr tablet Take 1 tablet (60 mg total) by mouth daily. 120 tablet 2   liraglutide (VICTOZA) 18 MG/3ML SOPN Inject 1.8 mg into the skin daily. (Patient taking differently: Inject 1.2 mg into the skin daily.) 3 mL 0   loratadine (CLARITIN) 10 MG tablet Take 10 mg by mouth daily.     metFORMIN (GLUCOPHAGE) 1000 MG tablet Take 1 tablet (1,000 mg total) by mouth 2 (two) times daily with a meal.     naloxone (NARCAN) nasal spray 4 mg/0.1 mL Place 4 mg into the nose as needed (overdose).     oxyCODONE (ROXICODONE) 15 MG immediate release tablet Take 15 mg by mouth 5 (five) times daily.     pantoprazole (PROTONIX) 40 MG tablet Take 1 tablet (40 mg total) by mouth daily. (Patient taking differently: Take 80 mg by mouth daily.) 30 tablet 0   pregabalin (LYRICA) 150 MG capsule Take 2 capsules (300 mg total) by mouth in the morning. 30 capsule 0   pregabalin (LYRICA) 150 MG capsule Take 1 capsule (150 mg total) by mouth at bedtime. 30 capsule 0   ranolazine (RANEXA) 500 MG 12 hr tablet Take 1 tablet (500 mg total) by mouth 2 (two) times daily. 180 tablet 3   lamoTRIgine (LAMICTAL) 25 MG tablet Take 1 tablet (25 mg total) by mouth daily. 30 tablet 0   nutrition supplement, JUVEN, (JUVEN) PACK Take 1 packet by mouth 2 (two) times daily between meals. (Patient not taking: Reported on 01/06/2022)  0     Objective: BP 109/74    Pulse 81    Temp 98.7 F (37.1 C) (Oral)    Resp 18    Ht 6\' 5"  (1.956 m)    Wt (!) 196.9 kg    SpO2 91%    BMI 51.46 kg/m  Exam: General:Awake, appear older than age, NAD HEENT: Atraumatic, MMM, No sclera icterus CV: RRR, no murmurs, normal S1/S2 Pulm: CTAB, good WOB on RA, no crackles or wheezing Abd: Soft, no distension, no tenderness Skin: dry, warm Ext: amputated digits of the left foot with edematous, erythematous and draining pus at the surgical site. Right great toe is amputated.  Neuro: Oriented x3, No focal neuro deficit  Psych: Normal affect   Labs and Imaging: CBC BMET  Recent Labs  Lab 01/06/22 0949 01/06/22 1035  WBC 12.3*  --   HGB 10.6* 10.9*  HCT 36.6* 32.0*  PLT 418*  --    Recent Labs  Lab 01/06/22 0949 01/06/22 1035  NA 137 138  K 6.3* 6.8*  CL 104  --   CO2 25  --   BUN 45*  --   CREATININE 2.22*  --  GLUCOSE 170*  --   CALCIUM 8.6*  --     DG Chest 1 View  Result Date: 01/06/2022 CLINICAL DATA:  Fall, chest pain EXAM: CHEST  1 VIEW COMPARISON:  Chest x-ray 11/17/2021 FINDINGS: Heart size and mediastinal contours are within normal limits. Cardiac surgical changes and median sternotomy wires. No suspicious pulmonary opacities identified. No pleural effusion or pneumothorax visualized. No acute osseous abnormality appreciated. IMPRESSION: No acute intrathoracic process identified. Electronically Signed   By: Ofilia Neas M.D.   On: 01/06/2022 10:56   DG Chest 2 View  Result Date: 11/17/2021 CLINICAL DATA:  Chest pain. EXAM: CHEST - 2 VIEW COMPARISON:  Chest x-ray 08/15/2021. FINDINGS: Sternotomy wires are again seen. The heart size and mediastinal contours are within normal limits. Both lungs are clear. The visualized skeletal structures are unremarkable. IMPRESSION: No active cardiopulmonary disease. Electronically Signed   By: Ronney Asters M.D.   On: 11/17/2021 22:29   CT HEAD WO CONTRAST (5MM)  Result Date:  01/06/2022 CLINICAL DATA:  Fall, head trauma EXAM: CT HEAD WITHOUT CONTRAST TECHNIQUE: Contiguous axial images were obtained from the base of the skull through the vertex without intravenous contrast. RADIATION DOSE REDUCTION: This exam was performed according to the departmental dose-optimization program which includes automated exposure control, adjustment of the mA and/or kV according to patient size and/or use of iterative reconstruction technique. COMPARISON:  09/18/2017 FINDINGS: Brain: No evidence of acute infarction, hemorrhage, hydrocephalus, extra-axial collection or mass lesion/mass effect. Vascular: No hyperdense vessel or unexpected calcification. Skull: Normal. Negative for fracture or focal lesion. Sinuses/Orbits: No acute finding. Other: Moderately severe diffuse pansinus mucosal thickened with polypoid mucosal disease in the right maxillary sinus. Difficult to exclude acute on chronic sinusitis. Mastoids are clear. IMPRESSION: 1. No acute intracranial abnormality by noncontrast CT. 2. Pansinus disease as above. Electronically Signed   By: Jerilynn Mages.  Shick M.D.   On: 01/06/2022 10:29   MR FOOT LEFT WO CONTRAST  Result Date: 11/19/2021 CLINICAL DATA:  Foot swelling.  Evaluate for osteomyelitis. EXAM: MRI OF THE LEFT FOOT WITHOUT CONTRAST TECHNIQUE: Multiplanar, multisequence MR imaging of the left foot was performed. No intravenous contrast was administered. COMPARISON:  03/05/2021 FINDINGS: Patient motion degrades image quality limiting evaluation. Bones/Joint/Cartilage No acute fracture or dislocation. Prior partial amputation of the fourth metatarsal and phalanges. Large wound along the dorsal aspect of the forefoot. Bone marrow edema in the second distal phalanx and head of the second middle phalanx concerning for osteomyelitis. No other marrow signal abnormality. Normal alignment. No joint effusion. Ligaments Collateral ligaments are intact.  Lisfranc ligament is intact. Muscles and Tendons  Generalized muscle atrophy. Flexor, extensor, peroneal tendons are intact. Soft tissue No fluid collection or hematoma. No soft tissue mass. Severe soft tissue edema along the dorsal aspect of the foot. Soft tissue wound along the plantar aspect of the foot at the level of the second and third metatarsal heads. Soft tissue edema of the second phalanx. Small amount of fluid in the first and second intermetatarsal bursa. IMPRESSION: 1. Soft tissue wound along the dorsal aspect of the forefoot at the level of the fourth metatarsal amputation and plantar aspect of the forefoot at the level of the second and third metatarsal heads. 2. Bone marrow edema in the second distal phalanx and head of the second middle phalanx concerning for osteomyelitis. 3. Generalized soft tissue edema along the dorsal aspect of the foot extending into the second phalanx and to lesser extent along the plantar aspect of the foot  most concerning for cellulitis which extends towards the ankle. Electronically Signed   By: Kathreen Devoid M.D.   On: 11/19/2021 07:03   DG Foot Complete Left  Result Date: 01/06/2022 CLINICAL DATA:  Foot pain EXAM: LEFT FOOT - COMPLETE 3+ VIEW COMPARISON:  Left foot x-ray 11/17/2021 FINDINGS: Interval distal transmetatarsal surgical amputation. There is heterogeneous destructive appearance of the distal stumps of the second through fourth metatarsals with adjacent periosteal reaction. Marked soft tissue swelling of the foot stump. IMPRESSION: Findings suggestive of osteomyelitis involving the distal metatarsal stumps as described. Consider further evaluation with MRI. Electronically Signed   By: Ofilia Neas M.D.   On: 01/06/2022 10:54   DG Foot Complete Left  Result Date: 11/17/2021 CLINICAL DATA:  Concern for osteomyelitis. EXAM: LEFT FOOT - COMPLETE 3+ VIEW COMPARISON:  Left toe x-ray 10/21/2021 3 FINDINGS: There is no acute fracture or dislocation. There is been prior amputation of the distal second  metatarsal and phalanx. Soft tissue ulceration is seen in this region. There is no cortical erosion or periosteal reaction. There is no acute fracture or dislocation. IMPRESSION: 1. Prior amputation of the second toe and distal second metatarsal with overlying soft tissue ulceration. 2. No acute bony abnormality. Electronically Signed   By: Ronney Asters M.D.   On: 11/17/2021 22:31   DG Foot Complete Right  Result Date: 11/17/2021 CLINICAL DATA:  Wound. EXAM: RIGHT FOOT COMPLETE - 3+ VIEW COMPARISON:  None. FINDINGS: There has been prior amputation of the first toe. There is soft tissue swelling of the forefoot. There is a linear radiopaque foreign body measuring 12 mm in the plantar soft tissues at the level of the fifth metatarsal. There is no evidence for fracture or dislocation. There is no cortical erosion or periosteal reaction. IMPRESSION: 1. Soft tissue swelling of the forefoot with plantar soft tissue foreign body. 2. Amputation of the first toe. 3. No acute bony abnormality. Electronically Signed   By: Ronney Asters M.D.   On: 11/17/2021 22:33   DG Hip Unilat With Pelvis 2-3 Views Left  Result Date: 01/06/2022 CLINICAL DATA:  Fall, hip pain EXAM: DG HIP (WITH OR WITHOUT PELVIS) 2-3V LEFT COMPARISON:  None. FINDINGS: No acute fracture or dislocation identified. Mild narrowing of the hip joint space with acetabular subchondral sclerosis. IMPRESSION: No acute osseous abnormality identified. Electronically Signed   By: Ofilia Neas M.D.   On: 01/06/2022 10:55   XR Toe 2nd Left  Result Date: 12/02/2021 Radiographs of left second toe are without bony abnormality.  VAS [place] LOWER EXTREMITY VENOUS (DVT)  Result Date: 11/18/2021  Lower Venous DVT Study Patient Name:  AKSHAY BILLA [facility]  Date of Exam:   11/18/2021 Medical Rec #: [id number]        Accession #:    [id number] Date of Birth: Apr 25, 1971        Patient Gender: M Patient Age:   56 years Exam Location:  [facility] Procedure:       VAS [place] LOWER EXTREMITY VENOUS (DVT) Referring Phys: Wandra Feinstein RATHORE --------------------------------------------------------------------------------  Indications: Swelling.  Risk Factors: None identified. Limitations: Body habitus and poor ultrasound/tissue interface. Comparison Study: No prior studies. Performing Technologist: Oliver Hum RVT  Examination Guidelines: A complete evaluation includes B-mode imaging, spectral Doppler, color Doppler, and power Doppler as needed of all accessible portions of each vessel. Bilateral testing is considered an integral part of a complete examination. Limited examinations for reoccurring indications may be performed as noted. The reflux portion of the exam  is performed with the patient in reverse Trendelenburg.  +---------+---------------+---------+-----------+----------+--------------+  RIGHT     Compressibility Phasicity Spontaneity Properties Thrombus Aging  +---------+---------------+---------+-----------+----------+--------------+  CFV       Full            Yes       Yes                                    +---------+---------------+---------+-----------+----------+--------------+  SFJ       Full                                                             +---------+---------------+---------+-----------+----------+--------------+  FV Prox   Full                                                             +---------+---------------+---------+-----------+----------+--------------+  FV Mid    Full                                                             +---------+---------------+---------+-----------+----------+--------------+  FV Distal Full                                                             +---------+---------------+---------+-----------+----------+--------------+  PFV       Full                                                             +---------+---------------+---------+-----------+----------+--------------+  POP       Full            Yes       Yes                                     +---------+---------------+---------+-----------+----------+--------------+  PTV       Full                                                             +---------+---------------+---------+-----------+----------+--------------+  PERO      Full                                                             +---------+---------------+---------+-----------+----------+--------------+   +---------+---------------+---------+-----------+----------+--------------+  LEFT      Compressibility Phasicity Spontaneity Properties Thrombus Aging  +---------+---------------+---------+-----------+----------+--------------+  CFV       Full            Yes       Yes                                    +---------+---------------+---------+-----------+----------+--------------+  SFJ       Full                                                             +---------+---------------+---------+-----------+----------+--------------+  FV Prox   Full                                                             +---------+---------------+---------+-----------+----------+--------------+  FV Mid    Full                                                             +---------+---------------+---------+-----------+----------+--------------+  FV Distal Full                                                             +---------+---------------+---------+-----------+----------+--------------+  PFV       Full                                                             +---------+---------------+---------+-----------+----------+--------------+  POP       Full            Yes       Yes                                    +---------+---------------+---------+-----------+----------+--------------+  PTV       Full                                                             +---------+---------------+---------+-----------+----------+--------------+  PERO      Full                                                              +---------+---------------+---------+-----------+----------+--------------+  Summary: RIGHT: - There is no evidence of deep vein thrombosis in the lower extremity. However, portions of this examination were limited- see technologist comments above.  - No cystic structure found in the popliteal fossa.  LEFT: - There is no evidence of deep vein thrombosis in the lower extremity. However, portions of this examination were limited- see technologist comments above.  - No cystic structure found in the popliteal fossa.  *See table(s) above for measurements and observations. Electronically signed by Jamelle Haring on 11/18/2021 at 4:01:15 PM.    Final      EKG: SR with no ST changes    Alen Bleacher, MD 01/06/2022, 1:35 PM PGY-1, [place] [facility] pager: [phone], text pages welcome   FPTS Upper-Level Resident Addendum   I have independently interviewed and examined the patient. I have discussed the above with the original author and agree with their documentation. My edits for correction/addition/clarification are in within the documentation. Please see also any attending notes.   Gerlene Fee, DO PGY-3, [facility] 01/06/2022 6:30 PM  [place] pager: [phone] (text pages welcome through [facility])

## 2022-01-06 NOTE — ED Notes (Signed)
MD at bedside. 

## 2022-01-06 NOTE — ED Notes (Signed)
MD NOTIFIED OF PTS POTASSIUM

## 2022-01-06 NOTE — Progress Notes (Signed)
FPTS Brief Progress Note  S: Patient received CT PE study showing no evidence of PE.  Did show nodule which will need repeat noncontrast CT in 6 to 12 months.  Patient reports he is doing well and his breathing is not back to baseline but he is doing a little better.  He is asking about his pain medications because he reports he takes 15 mg oxycodone 5 times daily.  He reports that the bed is uncomfortable on this is causing him pain.   O: BP 101/72 (BP Location: Left Arm)    Pulse 81    Temp 97.7 F (36.5 C) (Oral)    Resp 14    Ht 6\' 5"  (1.956 m)    Wt (!) 196.9 kg    SpO2 96%    BMI 51.46 kg/m   General:  A/P: Possible syncope PE study negative for PE.  Continue per day team's management recommendations.  No changes made to current plan.  Chronic pain Patient reports he is prescribed 15 mg oxycodone 5 times daily.  Have ordered 10 mg oxycodone 4 times daily as needed.  May need to come up on this but patient is a poor historian so is difficult to tell how many times he actually takes it daily.  - Orders reviewed. Labs for AM ordered, which was adjusted as needed.   , MD 01/06/2022, 9:08 PM PGY-3, [facility] Night Resident  Please page [phone] with questions.

## 2022-01-06 NOTE — ED Notes (Signed)
Patient used urinal. 

## 2022-01-06 NOTE — Sepsis Progress Note (Signed)
eLink monitoring code sepsis.  

## 2022-01-06 NOTE — ED Notes (Signed)
Patient back from radiology.

## 2022-01-06 NOTE — ED Notes (Signed)
MD aware of pt's blood pressure.  

## 2022-01-06 NOTE — ED Provider Notes (Addendum)
[facility] EMERGENCY DEPARTMENT Provider Note   CSN: [id number] Arrival date & time: 01/06/22  [id number]     History  Chief Complaint  Patient presents with   Joseph [name] is a 51 y.o. male.  Patient with history of TIA, cardiovascular disease, diabetes with recent amputation to his toes of his left foot currently on doxycycline supposedly, possibly blood clot history but not on blood thinners who presents with generalized weakness after fall yesterday.  He states that he has felt generalized weakness, has been weak.  Per EMS he had low blood pressure with them.  Has not had any fevers.  Denies any chest pain.  Having some left hip pain.  Did not think that he hit his head.  Also tested positive for COVID last week.  Finished antivirals.   Fall This is a new problem. The current episode started yesterday. The problem has been resolved. Pertinent negatives include no chest pain, no abdominal pain, no headaches and no shortness of breath. Nothing aggravates the symptoms. Nothing relieves the symptoms. He has tried nothing for the symptoms. The treatment provided no relief.      Home Medications Prior to Admission medications   Medication Sig Start Date End Date Taking? Authorizing Provider  albuterol (VENTOLIN HFA) 108 (90 Base) MCG/ACT inhaler Inhale 2 puffs into the lungs every 6 (six) hours as needed for wheezing or shortness of breath.    [provider]  ALPRAZolam Duanne Moron) 1 MG tablet Take 0.5 tablets (0.5 mg total) by mouth at bedtime. 1 mg in the morning  2 mg at bedtime 11/25/21   Nita Sells, MD  aspirin EC 81 MG tablet Take 81 mg by mouth daily. Swallow whole.    [provider]  atorvastatin (LIPITOR) 80 MG tablet Take 1 tablet (80 mg total) by mouth daily at 6 PM. 05/31/19   Darreld Mclean, PA-C  buPROPion [facility] SR) 150 MG 12 hr tablet Take 1 tablet (150 mg total) by mouth 2 (two) times daily. 11/25/21   Nita Sells, MD  carvedilol (COREG) 3.125 MG tablet Take 3.125 mg by mouth 2 (two) times daily. 11/17/21   [provider]  clopidogrel (PLAVIX) 75 MG tablet Take 1 tablet (75 mg total) by mouth daily with breakfast. 10/11/20   Ulyses Amor, PA-C  doxycycline (VIBRA-TABS) 100 MG tablet Take 1 tablet (100 mg total) by mouth 2 (two) times daily. 12/25/21   Suzan Slick, NP  fluticasone (FLONASE) 50 MCG/ACT nasal spray Place 2 sprays into both nostrils daily.    [provider]  icosapent Ethyl (VASCEPA) 1 g capsule Take 2 capsules (2 g total) by mouth 2 (two) times daily. 09/02/21   Skeet Latch, MD  insulin aspart (NOVOLOG) 100 UNIT/ML FlexPen Inject 6 Units into the skin 3 (three) times daily with meals. Per sliding scale 11/25/21   Nita Sells, MD  insulin glargine (LANTUS) 100 UNIT/ML injection Inject 0.58 mLs (58 Units total) into the skin 2 (two) times daily. 11/25/21   Nita Sells, MD  isosorbide mononitrate (IMDUR) 60 MG 24 hr tablet Take 1 tablet (60 mg total) by mouth daily. 11/25/21   Nita Sells, MD  lamoTRIgine (LAMICTAL) 25 MG tablet Take 1 tablet (25 mg total) by mouth daily. 11/26/21   Nita Sells, MD  liraglutide (VICTOZA) 18 MG/3ML SOPN Inject 1.8 mg into the skin daily. 07/17/21   Manuella Ghazi, Pratik D, DO  metFORMIN (GLUCOPHAGE) 1000 MG tablet Take 1  tablet (1,000 mg total) by mouth 2 (two) times daily with a meal. 11/05/19   Isaiah Serge, NP  mupirocin ointment (BACTROBAN) 2 % Place 1 application into the nose 2 (two) times daily. Patient taking differently: Place 1 application into the nose 2 (two) times daily. toes 08/20/21   Kathyrn Drown D, NP  mupirocin ointment (BACTROBAN) 2 % Apply topically 2 (two) times daily. Patient not taking: Reported on 11/18/2021 08/20/21   Tommie Raymond, NP  naloxone [facility]) nasal spray 4 mg/0.1 mL Place 4 mg into the nose as needed (overdose).    [provider]  nutrition supplement,  JUVEN, (JUVEN) PACK Take 1 packet by mouth 2 (two) times daily between meals. 11/25/21   Nita Sells, MD  oxyCODONE 7.5 MG TABS Take 7.5 mg by mouth every 6 (six) hours as needed for severe pain. 11/25/21   Nita Sells, MD  pantoprazole (PROTONIX) 40 MG tablet Take 1 tablet (40 mg total) by mouth daily. Patient taking differently: Take 80 mg by mouth daily. 09/23/17   Hongalgi, Lenis Dickinson, MD  pregabalin (LYRICA) 150 MG capsule Take 2 capsules (300 mg total) by mouth in the morning. 11/25/21   Nita Sells, MD  pregabalin (LYRICA) 150 MG capsule Take 1 capsule (150 mg total) by mouth at bedtime. 11/25/21   Nita Sells, MD  ranolazine (RANEXA) 500 MG 12 hr tablet Take 1 tablet (500 mg total) by mouth 2 (two) times daily. 09/02/21   Skeet Latch, MD      Allergies    Sulfa antibiotics    Review of Systems   Review of Systems  Respiratory:  Negative for shortness of breath.   Cardiovascular:  Negative for chest pain.  Gastrointestinal:  Negative for abdominal pain.  Neurological:  Negative for headaches.   Physical Exam Updated Vital Signs BP 96/68    Pulse 85    Temp 98.7 F (37.1 C) (Oral)    Resp 13    Ht 6\' 5"  (1.956 m)    Wt (!) 196.9 kg    SpO2 94%    BMI 51.46 kg/m  Physical Exam Vitals and nursing note reviewed.  Constitutional:      General: He is not in acute distress.    Appearance: He is well-developed. He is not ill-appearing.  HENT:     Head: Normocephalic and atraumatic.     Right Ear: Tympanic membrane normal.     Nose: Nose normal.     Mouth/Throat:     Mouth: Mucous membranes are moist.  Eyes:     Extraocular Movements: Extraocular movements intact.     Conjunctiva/sclera: Conjunctivae normal.     Pupils: Pupils are equal, round, and reactive to light.  Cardiovascular:     Rate and Rhythm: Normal rate and regular rhythm.     Pulses: Normal pulses.     Heart sounds: No murmur heard. Pulmonary:     Effort: Pulmonary effort is  normal. No respiratory distress.     Breath sounds: Normal breath sounds.  Abdominal:     Palpations: Abdomen is soft.     Tenderness: There is no abdominal tenderness.  Musculoskeletal:        General: Swelling present.     Cervical back: Neck supple.  Skin:    General: Skin is warm and dry.     Capillary Refill: Capillary refill takes less than 2 seconds.     Findings: Erythema present.     Comments: Left foot surgical site with erythema,  fluctuance, purulent drainage  Neurological:     General: No focal deficit present.     Mental Status: He is alert.     Cranial Nerves: No cranial nerve deficit.     Sensory: No sensory deficit.     Motor: No weakness.     Coordination: Coordination normal.     Comments: 5+ out of 5 strength, normal sensation  Psychiatric:        Mood and Affect: Mood normal.    ED Results / Procedures / Treatments   Labs (all labs ordered are listed, but only abnormal results are displayed) Labs Reviewed  LACTIC ACID, PLASMA - Abnormal; Notable for the following components:      Result Value   Lactic Acid, Venous 3.0 (*)    All other components within normal limits  COMPREHENSIVE METABOLIC PANEL - Abnormal; Notable for the following components:   Potassium 6.3 (*)    Glucose, Bld 170 (*)    BUN 45 (*)    Creatinine, Ser 2.22 (*)    Calcium 8.6 (*)    Albumin 2.4 (*)    GFR, Estimated 36 (*)    All other components within normal limits  CBC WITH DIFFERENTIAL/PLATELET - Abnormal; Notable for the following components:   WBC 12.3 (*)    Hemoglobin 10.6 (*)    HCT 36.6 (*)    MCH 24.4 (*)    MCHC 29.0 (*)    RDW 16.3 (*)    Platelets 418 (*)    Neutro Abs 8.7 (*)    Abs Immature Granulocytes 0.10 (*)    All other components within normal limits  I-STAT VENOUS BLOOD GAS, ED - Abnormal; Notable for the following components:   pCO2, Ven 43.9 (*)    pO2, Ven 95.0 (*)    Potassium 6.8 (*)    Calcium, Ion 1.13 (*)    HCT 32.0 (*)    Hemoglobin 10.9  (*)    All other components within normal limits  CBG MONITORING, ED - Abnormal; Notable for the following components:   Glucose-Capillary 173 (*)    All other components within normal limits  TROPONIN I (HIGH SENSITIVITY) - Abnormal; Notable for the following components:   Troponin I (High Sensitivity) 37 (*)    All other components within normal limits  CULTURE, BLOOD (ROUTINE X 2)  CULTURE, BLOOD (ROUTINE X 2)  URINE CULTURE  RESP PANEL BY RT-PCR (FLU A&B, COVID) ARPGX2  PROTIME-INR  APTT  LACTIC ACID, PLASMA  URINALYSIS, ROUTINE W REFLEX MICROSCOPIC    EKG EKG Interpretation  Date/Time:  Wednesday January 06 2022 09:54:33 EST Ventricular Rate:  92 PR Interval:  52 QRS Duration: 113 QT Interval:  342 QTC Calculation: 423 R Axis:   -47 Text Interpretation: Sinus rhythm Short PR interval Borderline IVCD with LAD Anterolateral infarct, age indeterminate Confirmed by Virgina NorfolkCuratolo, Jamarques Pinedo ([id number]) on 01/06/2022 10:24:19 AM  Radiology DG Chest 1 View  Result Date: 01/06/2022 CLINICAL DATA:  Fall, chest pain EXAM: CHEST  1 VIEW COMPARISON:  Chest x-ray 11/17/2021 FINDINGS: Heart size and mediastinal contours are within normal limits. Cardiac surgical changes and median sternotomy wires. No suspicious pulmonary opacities identified. No pleural effusion or pneumothorax visualized. No acute osseous abnormality appreciated. IMPRESSION: No acute intrathoracic process identified. Electronically Signed   By: Jannifer Hickelaney  Williams M.D.   On: 01/06/2022 10:56   CT HEAD WO CONTRAST (5MM)  Result Date: 01/06/2022 CLINICAL DATA:  Fall, head trauma EXAM: CT HEAD WITHOUT CONTRAST TECHNIQUE: Contiguous axial images  were obtained from the base of the skull through the vertex without intravenous contrast. RADIATION DOSE REDUCTION: This exam was performed according to the departmental dose-optimization program which includes automated exposure control, adjustment of the mA and/or kV according to patient size and/or  use of iterative reconstruction technique. COMPARISON:  09/18/2017 FINDINGS: Brain: No evidence of acute infarction, hemorrhage, hydrocephalus, extra-axial collection or mass lesion/mass effect. Vascular: No hyperdense vessel or unexpected calcification. Skull: Normal. Negative for fracture or focal lesion. Sinuses/Orbits: No acute finding. Other: Moderately severe diffuse pansinus mucosal thickened with polypoid mucosal disease in the right maxillary sinus. Difficult to exclude acute on chronic sinusitis. Mastoids are clear. IMPRESSION: 1. No acute intracranial abnormality by noncontrast CT. 2. Pansinus disease as above. Electronically Signed   By: Jerilynn Mages.  Shick M.D.   On: 01/06/2022 10:29   DG Foot Complete Left  Result Date: 01/06/2022 CLINICAL DATA:  Foot pain EXAM: LEFT FOOT - COMPLETE 3+ VIEW COMPARISON:  Left foot x-ray 11/17/2021 FINDINGS: Interval distal transmetatarsal surgical amputation. There is heterogeneous destructive appearance of the distal stumps of the second through fourth metatarsals with adjacent periosteal reaction. Marked soft tissue swelling of the foot stump. IMPRESSION: Findings suggestive of osteomyelitis involving the distal metatarsal stumps as described. Consider further evaluation with MRI. Electronically Signed   By: Ofilia Neas M.D.   On: 01/06/2022 10:54   DG Hip Unilat With Pelvis 2-3 Views Left  Result Date: 01/06/2022 CLINICAL DATA:  Fall, hip pain EXAM: DG HIP (WITH OR WITHOUT PELVIS) 2-3V LEFT COMPARISON:  None. FINDINGS: No acute fracture or dislocation identified. Mild narrowing of the hip joint space with acetabular subchondral sclerosis. IMPRESSION: No acute osseous abnormality identified. Electronically Signed   By: Ofilia Neas M.D.   On: 01/06/2022 10:55    Procedures .Critical Care Performed by: Lennice Sites, DO Authorized by: Lennice Sites, DO   Critical care provider statement:    Critical care time (minutes):  35   Critical care was  necessary to treat or prevent imminent or life-threatening deterioration of the following conditions:  Sepsis   Critical care was time spent personally by me on the following activities:  Blood draw for specimens, development of treatment plan with patient or surrogate, discussions with consultants, discussions with primary provider, evaluation of patient's response to treatment, examination of patient, obtaining history from patient or surrogate, ordering and performing treatments and interventions, ordering and review of laboratory studies, ordering and review of radiographic studies, pulse oximetry, re-evaluation of patient's condition and review of old charts   I assumed direction of critical care for this patient from another provider in my specialty: no     Care discussed with: admitting provider      Medications Ordered in ED Medications  piperacillin-tazobactam (ZOSYN) IVPB 3.375 g (3.375 g Intravenous New Bag/Given 01/06/22 1047)  vancomycin (VANCOCIN) IVPB 1000 mg/200 mL premix (1,000 mg Intravenous New Bag/Given 01/06/22 1045)  lactated ringers infusion (has no administration in time range)  lactated ringers bolus 1,000 mL (has no administration in time range)  insulin aspart (novoLOG) injection 5 Units (has no administration in time range)    And  dextrose 50 % solution 50 mL (has no administration in time range)  sodium zirconium cyclosilicate (LOKELMA) packet 10 g (has no administration in time range)  lactated ringers bolus 1,000 mL (1,000 mLs Intravenous New Bag/Given 01/06/22 1000)  lactated ringers bolus 1,000 mL (1,000 mLs Intravenous New Bag/Given 01/06/22 1055)    ED Course/ Medical Decision Making/ A&P  Medical Decision Making Amount and/or Complexity of Data Reviewed Labs: ordered. Radiology: ordered. ECG/medicine tests: ordered.  Risk OTC drugs. Prescription drug management. Decision regarding hospitalization.   Joseph [name] is a  51 year old male with history of high cholesterol, hypertension, pulmonary embolism, diabetes who presents to the ED with generalized weakness.  Patient arrives a blood pressure of 92/80.  No fever.  Normal vitals otherwise.  Supposedly may be had low blood pressure with EMS.  He had mechanical fall yesterday in which his legs gave out.  He is felt weak ever since but did not want to come to the ED yesterday.  He states that he is not on blood thinners but he is not sure of his medications.  He recently had amputation of the toes on his left foot and states that he was on doxycycline for this.  He has been wearing a walking boot.  He denies any cough or sputum production.  No chest pain or shortness of breath.  Overall clinically he looks chronically ill.  Neuro exam appears to be normal.  Significant exam is on his left foot surgical site there is purulence and erythema and fluctuance.  My suspicion is may be for ongoing infectious process in the left foot.  Differential diagnosis includes sepsis versus dehydration versus traumatic injuries including head injury versus hip injury.  Diagnosis is considered are also acute coronary syndrome, pulmonary embolism.  We will pursue work-up with sepsis work-up with blood cultures, IV antibiotics as concern for ongoing foot infection.  We will get CT scan of his head, chest x-ray, hip x-ray.  We will give IV fluid bolus as patient is normotensive blood continue to evaluate for need for further fluids.  Anticipate admission.  Patient also had positive COVID test last week.  Finished antivirals.  I have consulted Hilbert Odor with orthopedic team to come evaluate the left foot as suspect he will need to be admitted for ongoing infection here.  1030 am I have been able to talk with family member who confirms that he is not on blood thinners.  Has not been on them for several years since his bypass surgery.  Patient did have COVID over a week ago as well and finished  antiviral course.  11 AM blood pressure on reevaluation has dropped to 73/56.  IV fluids had not been started yet however will give fluid per his ideal body weight of 90 kg given that he is 200 kg, we will give 3 L of lactated ringer bolus.  Code sepsis has been initiated.  Patient had 500 cc of fluids and and blood pressure quickly has improved to 103/80.  We will continue to monitor.  Blood work that has returned shows a significant lactic acidosis at 3.  Potassium is 6.3.  No EKG changes of hyperkalemia.  Therefore we will treat that with Lokelma, IV insulin and dextrose.  Patient does have a creatinine of 2.2 which is increased from his baseline.  Does have a leukocytosis of 12.3.  Overall patient with severe sepsis secondary to ongoing foot infection.  X-ray per my interpretation shows soft tissue swelling of the left foot with concern for possible osteomyelitis.  From a traumatic standpoint his chest x-ray and pelvic x-ray per my interpretation shows no pneumothorax, rib fracture, pneumonia, hip fracture.  CT scan of the head per my interpretation shows no head bleed.   Overall patient to be admitted to medicine for further sepsis care likely secondary to diabetic foot infection.  Orthopedic team following.  We will continue to monitor hemodynamics but appear to be improving following IV fluids.  12:30 pm fluid boluses are completed and blood pressure stabilized to the low 100s.  Hilbert Odor with orthopedics has evaluated the patient in the ED.  Plan is likely for BKA on Friday.  Will admit to medicine for further care.  COVID test remains positive.  This chart was dictated using voice recognition software.  Despite best efforts to proofread,  errors can occur which can change the documentation meaning.         Final Clinical Impression(s) / ED Diagnoses Final diagnoses:  Sepsis, due to unspecified organism, unspecified whether acute organ dysfunction present [facility])  Cellulitis of left  lower extremity    Rx / DC Orders ED Discharge Orders     None         Lennice Sites, DO 01/06/22 [place], [place], DO 01/06/22 [place], [place], DO 01/06/22 1451

## 2022-01-06 NOTE — Progress Notes (Signed)
Pharmacy Antibiotic Note  Joseph [name] Joseph [name] is a 51 y.o. male admitted on 01/06/2022 with  foot infection . Patient had received 2 weeks of outpatient doxycycline and had no improvement in infection. Patient met sepsis criteria on ED presentation. Patient received Zosyn x 1 and 1g Vancomycin in ED. Pharmacy has been consulted for vancomycin and cefepime dosing.  WBC elevated at 12.3 today, patient has remained afebrile. Scr is elevated from baseline at 2.09 today. True CrCl likely overestimated given body habitus. Given renal function, will dose vancomycin intermittently.  Plan: Cefepime 2g IV q12h Vancomycin 1.5g x 1 to complete 2.5g load Vancomycin IV intermittent dosing for variable renal function Metronidazole per MD Monitor renal function and signs of clinical improvement Follow-up cultures  Height: _0  (195.6 cm) Weight: (!) 196.9 kg (434 lb) IBW/kg (Calculated) : 89.1  Temp (24hrs), Avg:98.2 F (36.8 C), Min:97.7 F (36.5 C), Max:98.7 F (37.1 C)  Recent Labs  Lab 01/06/22 0949 01/06/22 1201 01/06/22 1600  WBC 12.3*  --   --   CREATININE 2.22*  --  2.09*  LATICACIDVEN 3.0* 2.2*  --     Estimated Creatinine Clearance: 80.8 mL/min (A) (by C-G formula based on SCr of 2.09 mg/dL (H)).    Allergies  Allergen Reactions   Sulfa Antibiotics Other (See Comments)    Headaches     Antimicrobials this admission: 1/18 vancomycin >>  1/18 cefepime >>  1/18 metronidazole >> 1/18 zosyn x 1  Dose adjustments this admission: none  Microbiology results: 1/18 BCx: pending 1/18 UCx: pending   Thank you for involving pharmacy in this patient's care.  Elita Quick, PharmD PGY1 Ambulatory Care Pharmacy Resident 01/06/2022 10:58 PM  **Pharmacist phone directory can be found on [email or web address] listed under [place]**

## 2022-01-06 NOTE — Hospital Course (Addendum)
Joseph [name] is a 51 y.o. male [facility] and [facility]. Hospital course is outlined below by system.   Fall due to possible syncope Presented in the ED after a fall which he attributed to generalized weakness.there is low concern for seizure considering lack of postictal state. With his complex cardiac history ACS was ruled out and no signs of arrhythmia given normal troponin and EKG showed SR with no ST changes. Recent echo showed EF of 55 and on exam appears euvolemic with no BLE edema or JVD.  Pulmonary and cardiac exam were normal.  Given mild BP on admission and active infection of his left foot there was strong suspicion for syncope in the setting of active infection. Patient received bolus fluid and was transitioned to maintenance IVF. Patient had no complains of syncope by discharge  Left foot infection   sepsis Patient recently had a TMT 2 weeks ago, and on admission his left toe was found to be edematous, erythematous, and draining pus. Lactic acid was elevated to 3.0, with fluid resuscitation improved to 2.2.  In addition he had leukocytosis with white blood count of 12.3 and left foot x-ray showed a third digit destruction of the distal stump of the second through fourth metatarsal with soft tissues swelling suggestive of osteomyelitis.  He was started on antibiotics of cefepime, vancomycin and Zosyn.  Orthopedic surgery was consulted who performed BKA and recommended 24 hours of antibiotics were patient was transitioned to vancomycin, cefepime and Flagyl.  For pain management patient received Dilaudid and oxycodone as needed.  By discharge patient had been transitioned to home opoid dose of 15 mg Oxycodone 5 times a day.  PT/OT was consulted post surgery and they recommended SNF. Patient continued to progress with PT/OT. By discharge patient was medically stable and appropriate for SNF.  COVID-19 positive On admission  patient was found to be positive.  He reports testing positive for COVID January 6 and that was treated outpatient by his PCP.  He reports completing his treatment prescribed by his PCP.   Type 2 diabetes His A1c on admission was 8.4 on admission.  CBG was monitored closely during stay and was adjusted to his needs.  He received up to 45 units daily of long acting insulin, and was on sliding scale while admitted.  Metformin was temporarily held due to his AKI.  At time of discharge patient's medications were restarted. His sugars were controlled during admission.   AKI On admission patient's creatinine was 2.22 and his baseline is 1.45.  Likely prerenal attributed to his sepsis.  With fluid resuscitation creatinine improved and at discharge patient's creatinine had improved to 1.10.   Hyperkalemia Patient was found to be hyperkalemic on admission with a potassium of 6.3 and he was started on Lokelma 10 mg twice daily with daily monitoring of his electrolyte levels.  EKG on admission was sinus rhythm with no ST changes.  At time of discharge patient's potassium was stable at 4.7  Right foot wound Patient had stage 1 pressure wound on dorsal surface of right foot. Wound bed pink with dry skin surrounding a central dark spot. Wound remained stable over admission and was seen by wound care who recommend BID cleaning with soap and water, and a moisturizing lotion. Preliminary ABI's showed mild right lower extremity arterial disease.   PCP follow-up recommendations 8 mm left solid pulmonary nodule within the upper lobe. Recommend a non-contrast Chest  CT at 6-12 months. If patient is high risk for malignancy, recommend an additional non-contrast Chest CT at 18-24 months; if patient is low risk for malignancy a non-contrast Chest CT at 18-24 months is optional. DME equipment: Bariatric Wheelchair, tub/shower bench, BSC/3in1 (removable arm and leg rests, bariatric size, wheelchair platform for residual limb  s/p BKA; slide board, drop arm bariatric BSC) Keep eye on R foot pressure wound, stage 1, BID wound care Consider weaning pain meds as patient consistently falling asleep during exams  Held Coreg, Imdur for soft Bps, consider adding back outptaient if BP normalizes  Insulin decreased to 45 units, consider increasing depending on sugars outpatient  Follow up on final ABI results.

## 2022-01-06 NOTE — ED Notes (Signed)
Patient transported to CT 

## 2022-01-07 ENCOUNTER — Ambulatory Visit: Payer: Medicare HMO | Admitting: Orthopedic Surgery

## 2022-01-07 ENCOUNTER — Inpatient Hospital Stay (HOSPITAL_COMMUNITY): Payer: Medicare HMO

## 2022-01-07 DIAGNOSIS — A419 Sepsis, unspecified organism: Secondary | ICD-10-CM

## 2022-01-07 DIAGNOSIS — L03116 Cellulitis of left lower limb: Secondary | ICD-10-CM | POA: Diagnosis not present

## 2022-01-07 DIAGNOSIS — M86272 Subacute osteomyelitis, left ankle and foot: Secondary | ICD-10-CM | POA: Diagnosis not present

## 2022-01-07 DIAGNOSIS — R55 Syncope and collapse: Secondary | ICD-10-CM

## 2022-01-07 LAB — CBC
HCT: 30.3 % — ABNORMAL LOW (ref 39.0–52.0)
Hemoglobin: 9.2 g/dL — ABNORMAL LOW (ref 13.0–17.0)
MCH: 24.9 pg — ABNORMAL LOW (ref 26.0–34.0)
MCHC: 30.4 g/dL (ref 30.0–36.0)
MCV: 82.1 fL (ref 80.0–100.0)
Platelets: 322 10*3/uL (ref 150–400)
RBC: 3.69 MIL/uL — ABNORMAL LOW (ref 4.22–5.81)
RDW: 16.7 % — ABNORMAL HIGH (ref 11.5–15.5)
WBC: 9 10*3/uL (ref 4.0–10.5)
nRBC: 0 % (ref 0.0–0.2)

## 2022-01-07 LAB — COMPREHENSIVE METABOLIC PANEL
ALT: 17 U/L (ref 0–44)
AST: 29 U/L (ref 15–41)
Albumin: 2.4 g/dL — ABNORMAL LOW (ref 3.5–5.0)
Alkaline Phosphatase: 73 U/L (ref 38–126)
Anion gap: 10 (ref 5–15)
BUN: 31 mg/dL — ABNORMAL HIGH (ref 6–20)
CO2: 23 mmol/L (ref 22–32)
Calcium: 8.7 mg/dL — ABNORMAL LOW (ref 8.9–10.3)
Chloride: 105 mmol/L (ref 98–111)
Creatinine, Ser: 1.57 mg/dL — ABNORMAL HIGH (ref 0.61–1.24)
GFR, Estimated: 54 mL/min — ABNORMAL LOW (ref >60)
Glucose, Bld: 156 mg/dL — ABNORMAL HIGH (ref 70–99)
Potassium: 5 mmol/L (ref 3.5–5.1)
Sodium: 138 mmol/L (ref 135–145)
Total Bilirubin: 0.2 mg/dL — ABNORMAL LOW (ref 0.3–1.2)
Total Protein: 6.7 g/dL (ref 6.5–8.1)

## 2022-01-07 LAB — BASIC METABOLIC PANEL
Anion gap: 8 (ref 5–15)
BUN: 39 mg/dL — ABNORMAL HIGH (ref 6–20)
CO2: 23 mmol/L (ref 22–32)
Calcium: 8.2 mg/dL — ABNORMAL LOW (ref 8.9–10.3)
Chloride: 108 mmol/L (ref 98–111)
Creatinine, Ser: 1.98 mg/dL — ABNORMAL HIGH (ref 0.61–1.24)
GFR, Estimated: 41 mL/min — ABNORMAL LOW (ref >60)
Glucose, Bld: 133 mg/dL — ABNORMAL HIGH (ref 70–99)
Potassium: 5.5 mmol/L — ABNORMAL HIGH (ref 3.5–5.1)
Sodium: 139 mmol/L (ref 135–145)

## 2022-01-07 LAB — CBC WITH DIFFERENTIAL/PLATELET
Abs Immature Granulocytes: 0.04 10*3/uL (ref 0.00–0.07)
Basophils Absolute: 0.1 10*3/uL (ref 0.0–0.1)
Basophils Relative: 1 %
Eosinophils Absolute: 0.2 10*3/uL (ref 0.0–0.5)
Eosinophils Relative: 3 %
HCT: 32.7 % — ABNORMAL LOW (ref 39.0–52.0)
Hemoglobin: 10.1 g/dL — ABNORMAL LOW (ref 13.0–17.0)
Immature Granulocytes: 1 %
Lymphocytes Relative: 28 %
Lymphs Abs: 2 10*3/uL (ref 0.7–4.0)
MCH: 24.9 pg — ABNORMAL LOW (ref 26.0–34.0)
MCHC: 30.9 g/dL (ref 30.0–36.0)
MCV: 80.5 fL (ref 80.0–100.0)
Monocytes Absolute: 0.7 10*3/uL (ref 0.1–1.0)
Monocytes Relative: 10 %
Neutro Abs: 4.1 10*3/uL (ref 1.7–7.7)
Neutrophils Relative %: 57 %
Platelets: 350 10*3/uL (ref 150–400)
RBC: 4.06 MIL/uL — ABNORMAL LOW (ref 4.22–5.81)
RDW: 16.2 % — ABNORMAL HIGH (ref 11.5–15.5)
WBC: 7.1 10*3/uL (ref 4.0–10.5)
nRBC: 0 % (ref 0.0–0.2)

## 2022-01-07 LAB — CBG MONITORING, ED
Glucose-Capillary: 114 mg/dL — ABNORMAL HIGH (ref 70–99)
Glucose-Capillary: 127 mg/dL — ABNORMAL HIGH (ref 70–99)
Glucose-Capillary: 135 mg/dL — ABNORMAL HIGH (ref 70–99)
Glucose-Capillary: 169 mg/dL — ABNORMAL HIGH (ref 70–99)

## 2022-01-07 LAB — GLUCOSE, CAPILLARY
Glucose-Capillary: 123 mg/dL — ABNORMAL HIGH (ref 70–99)
Glucose-Capillary: 146 mg/dL — ABNORMAL HIGH (ref 70–99)

## 2022-01-07 LAB — ECHOCARDIOGRAM COMPLETE
AR max vel: 2.17 cm2
AV Area VTI: 2.17 cm2
AV Area mean vel: 2.19 cm2
AV Mean grad: 6.5 mmHg
AV Peak grad: 12.3 mmHg
Ao pk vel: 1.75 m/s
Area-P 1/2: 3.74 cm2
Height: 77 in
S' Lateral: 3.95 cm
Weight: 6944 oz

## 2022-01-07 LAB — PREALBUMIN: Prealbumin: 16.5 mg/dL — ABNORMAL LOW (ref 18–38)

## 2022-01-07 LAB — MAGNESIUM: Magnesium: 1.2 mg/dL — ABNORMAL LOW (ref 1.7–2.4)

## 2022-01-07 MED ORDER — SODIUM CHLORIDE 0.9 % IV SOLN: INTRAVENOUS | Status: DC

## 2022-01-07 MED ORDER — VANCOMYCIN HCL 1500 MG/300ML IV SOLN
1500.0000 mg | Freq: Once | INTRAVENOUS | Status: AC
  Administered 2022-01-08: 1500 mg via INTRAVENOUS
  Filled 2022-01-07 (×2): qty 300

## 2022-01-07 NOTE — Progress Notes (Signed)
Pharmacy Antibiotic Note  Joseph [name] is a 51 y.o. male admitted on 01/06/2022 with  foot infection . Patient had received 2 weeks of outpatient doxycycline and had no improvement in infection. Pharmacy has been consulted for vancomycin and cefepime dosing.  Patient initially received Zosyn x 1 and 1g vancomycin, followed by vancomycin 1.5g x1 for a total 2.5g load. Serum creatinine is slightly improved today at 1.98 from 2.09. WBC within normal limits and patient has remained afebrile. Amputation tentatively planned for 1/20 in the afternoon.   Due to improved serum creatinine today, we will plan to redose vancomycin tonight and reassess following surgery.   Plan: Vancomycin 1.5g x1 this evening  Vancomycin IV intermittent dosing for variable renal function Continue cefepime 2g IV q12h Continue metronidazole per MD Monitor renal function and signs of clinical improvement Follow-up cultures  Height: 6\' 5"  (195.6 cm) Weight: (!) 196.9 kg (434 lb) IBW/kg (Calculated) : 89.1  Temp (24hrs), Avg:97.7 F (36.5 C), Min:97.4 F (36.3 C), Max:98.1 F (36.7 C)  Recent Labs  Lab 01/06/22 0949 01/06/22 1201 01/06/22 1600 01/07/22 0336  WBC 12.3*  --   --  9.0  CREATININE 2.22*  --  2.09* 1.98*  LATICACIDVEN 3.0* 2.2*  --   --      Estimated Creatinine Clearance: 85.3 mL/min (A) (by C-G formula based on SCr of 1.98 mg/dL (H)).    Allergies  Allergen Reactions   Sulfa Antibiotics Other (See Comments)    Headaches     Antimicrobials this admission: 1/18 vancomycin >>  1/18 cefepime >>  1/18 metronidazole >> 1/18 zosyn x 1  Dose adjustments this admission: none  Microbiology results: 1/18 BCx: pending 1/18 UCx: pending   Thank you for involving pharmacy in this patient's care.  2/18, PharmD PGY1 Acute Care Pharmacy Resident  Phone: [phone] 01/07/2022  3:17 PM  Please check [email or web address] for unit-specific pharmacy phone numbers.

## 2022-01-07 NOTE — Progress Notes (Addendum)
Family Medicine Teaching Service Daily Progress Note Intern Pager: [phone]  Patient name: Joseph [name] Medical record number: [id number] Date of birth: December 29, 1970 Age: 51 y.o. Gender: male  Primary Care Provider: Marva Panda, NP Consultants: Ortho Code Status: Full  Pt Overview and Major Events to Date:  1/19-admitted  Assessment and Plan:  Joseph [name] is a 51 year old male presenting with weakness and fall.  PMH is significant for CAD s/p CABG x4, PAD, T2DM, HTN, HLD, PE, COPD, GERD, and left toe amputation  Fall with concern for possible syncope Left foot is erythematous, edematous and draining purulence.  Patient endorses mild pain from left foot to shin. He has remained afebrile overnight.  Ortho is following and plan for BKA today.  Appreciate their recommendations. -Orthopedic following, appreciate recs -Continue monitoring fever cough -Continue cardiac monitoring -Continue vancomycin, cefepime, Zosyn -Scheduled BKA today -Continue Tylenol 650 mg every 6 hours as needed  T2DM A1c is 8.4.  CBGs this morning have been 225-239.  Home medication includes Victoza and metformin.In the last 24hr patient received 30u Semglee and 24 units of short acting.  Considering he is n.p.o. today we will start him on 40 units tomorrow. -Hold metformin due to AKI -Continue monitoring CBG -Increase semglee to 40u starting tomorrow   AKI Patient presented with AKI on admission likely due to dehydration.  Creatinine this morning is 1.45 and improved.   -Avoid nephrotoxic agent -Continue monitoring lab, BMP  Hyperkalemia Potassium this morning was 4.8 currently on Lokelma daily.  Will look to discontinue tomorrow. -continue Lokelma 10 mg daily  -Continue cardiac monitoring -Continue daily lab, BMP  CAD s/p CABG   PAD He denies any chest pain or palpitations this morning. Cardiac exam was normal.  Home medication includes atorvastatin 80 mg daily, Vascepa 2 g twice  daily -Continue home medication  Ordered chronic and stable conditions Bipolar with depression disorder GERD  COPD   FEN/GI: N.p.o. PPx: NONE due to scheduled surgery Dispo:Home pending clinical improvement . Barriers include scheduled surgery.   Subjective:  Joseph [name] was awake and laying in bed this morning.  He said he did not sleep well last night because he was has a lot on his mind.  Reports having mild pain on his left lower extremity but otherwise has no other complaints.  Objective: Temp:  [97.4 F (36.3 C)-98.1 F (36.7 C)] 98.1 F (36.7 C) (01/19 0727) Pulse Rate:  [75-85] 75 (01/19 1428) Resp:  [12-21] 18 (01/19 1428) BP: (98-132)/(69-94) 132/94 (01/19 1428) SpO2:  [91 %-99 %] 97 % (01/19 1428) Physical Exam: General:Awake, well appearing, NAD HEENT: Atraumatic, MMM, No sclera icterus CV: RRR, no murmurs, normal S1/S2 Pulm: CTAB, good WOB on RA, no crackles or wheezing Abd: Soft, no distension, no tenderness Skin: dry, warm Ext: No BLE edema, +2 Pedal and radial pulse.   Laboratory: Recent Labs  Lab 01/06/22 0949 01/06/22 1035 01/07/22 0336  WBC 12.3*  --  9.0  HGB 10.6* 10.9* 9.2*  HCT 36.6* 32.0* 30.3*  PLT 418*  --  322   Recent Labs  Lab 01/06/22 0949 01/06/22 1035 01/06/22 1600 01/07/22 0336  NA 137 138 138 139  K 6.3* 6.8* 5.9* 5.5*  CL 104  --  105 108  CO2 25  --  25 23  BUN 45*  --  43* 39*  CREATININE 2.22*  --  2.09* 1.98*  CALCIUM 8.6*  --  8.5* 8.2*  PROT 7.1  --   --   --  BILITOT 0.4  --   --   --   ALKPHOS 88  --   --   --   ALT 13  --   --   --   AST 22  --   --   --   GLUCOSE 170*  --  137* 133*    Imaging/Diagnostic Tests: No new test  Jerre Simon, MD 01/07/2022, 6:23 PM PGY-1, [facility] Family Medicine FPTS Intern pager: [phone], text pages welcome

## 2022-01-07 NOTE — ED Notes (Signed)
Pt placed on 2L [place] due to o2 saturation dropping while sleeping.

## 2022-01-07 NOTE — H&P (View-Only) (Signed)
ORTHOPAEDIC CONSULTATION  REQUESTING PHYSICIAN: Kinnie Feil, MD  Chief Complaint: Cellulitis ulceration and drainage left foot.  HPI: Joseph [name] is a 51 y.o. male who presents with wound dehiscence cellulitis and drainage from the left transmetatarsal amputation.  Patient has had progressive redness swelling and drainage.  Past Medical History:  Diagnosis Date   Aneurysm of ascending aorta 09/14/2021   Anxiety    Arthritis    "knees, shoulders, hips, ankles" (07/29/2016)   Asthma    Bicuspid aortic valve 09/14/2021   CAD (coronary artery disease)    a. 2017: s/p BMS to distal Cx; b. LHC 05/30/2019: 80% mid, distal RCA s/p DES, 30% narrowing of d LM, widely patent LAD w/ luminal irregularities, widely patent stent in [place]  w/ 90+% stenosis distal to stent beofre small trifurcating obtuse marginal (potentially area of restenosis)   Chronic bronchitis (HCC)    Chronic ulcer of right great toe ([place]) 09/19/2017   COPD (chronic obstructive pulmonary disease) (HCC)    Depression    DVT (deep venous thrombosis) (HCC)    GERD (gastroesophageal reflux disease)    Hyperlipidemia    Hypertension    Myocardial infarction ([place]) 1996   "light one"   PE (pulmonary embolism) 04/2013   On chronic Xarelto   Peripheral nerve disease    Pneumonia "several times"   Sleep apnea    Type II diabetes mellitus ([place])    Past Surgical History:  Procedure Laterality Date   ABDOMINAL AORTOGRAM W/LOWER EXTREMITY N/A 10/10/2020   Procedure: ABDOMINAL AORTOGRAM W/LOWER EXTREMITY;  Surgeon: Elam Dutch, MD;  Location: [place] [facility];  Service: Cardiovascular;  Laterality: N/A;   AMPUTATION Right 09/21/2017   Procedure: RIGHT GREAT TOE AMPUTATION, POSSIBLE VAC;  Surgeon: Leandrew Koyanagi, MD;  Location: [place];  Service: Orthopedics;  Laterality: Right;   AMPUTATION Left 08/13/2020   Procedure: LEFT FOOT 4TH RAY AMPUTATION;  Surgeon: Newt Minion, MD;  Location: [place];  Service: Orthopedics;   Laterality: Left;   AMPUTATION Left 11/20/2021   Procedure: LEFT TRANSMETATARSAL AMPUTATION;  Surgeon: Newt Minion, MD;  Location: [place];  Service: Orthopedics;  Laterality: Left;   AORTOGRAM Bilateral 03/13/2021   Procedure: ABDOMINAL AORTOGRAM WITH Left LOWER EXTREMITY RUNOFF;  Surgeon: Cherre Robins, MD;  Location: [facility] OR;  Service: Vascular;  Laterality: Bilateral;   CARDIAC CATHETERIZATION  2006   [place]   "@ [facility]; when I had my heart attack"   CARDIAC CATHETERIZATION N/A 07/29/2016   Procedure: Left Heart Cath and Coronary Angiography;  Surgeon: Lorretta Harp, MD;  Location: [place] [facility];  Service: Cardiovascular;  Laterality: N/A;   CARDIAC CATHETERIZATION N/A 07/29/2016   Procedure: Coronary Stent Intervention;  Surgeon: Lorretta Harp, MD;  Location: [place] [facility];  Service: Cardiovascular;  Laterality: N/A;   CARPAL TUNNEL RELEASE Bilateral    CORONARY ANGIOPLASTY WITH STENT PLACEMENT  07/29/2016   CORONARY ARTERY BYPASS GRAFT N/A 07/24/2020   Procedure: CORONARY ARTERY BYPASS GRAFTING (CABG), ON PUMP, TIMES FOUR, USING LEFT INTERNAL MAMMARY ARTERY AND ENDOSCOPICALLY HARVESTED RIGHT GREATER SAPHENOUS VEIN;  Surgeon: Lajuana Matte, MD;  Location: [place];  Service: Open Heart Surgery;  Laterality: N/A;  FLOW TAC   CORONARY STENT INTERVENTION N/A 05/30/2019   Procedure: CORONARY STENT INTERVENTION;  Surgeon: Belva Crome, MD;  Location: [place] [facility];  Service: Cardiovascular;  Laterality: N/A;   CORONARY STENT INTERVENTION N/A 11/02/2019   Procedure: CORONARY  STENT INTERVENTION;  Surgeon: Wellington Hampshire, MD;  Location: [place] [facility];  Service: Cardiovascular;  Laterality: N/A;   ESOPHAGOGASTRODUODENOSCOPY N/A 09/22/2017   Procedure: ESOPHAGOGASTRODUODENOSCOPY (EGD);  Surgeon: Ladene Artist, MD;  Location: [facility];  Service: Endoscopy;  Laterality: N/A;   INTRAVASCULAR PRESSURE WIRE/FFR STUDY N/A 07/22/2020   Procedure:  INTRAVASCULAR PRESSURE WIRE/FFR STUDY;  Surgeon: Leonie Man, MD;  Location: [place] [facility];  Service: Cardiovascular;  Laterality: N/A;   KNEE ARTHROSCOPY Bilateral    "2 on left; 1 on the right"   LEFT HEART CATH AND CORONARY ANGIOGRAPHY N/A 11/02/2019   Procedure: LEFT HEART CATH AND CORONARY ANGIOGRAPHY;  Surgeon: Wellington Hampshire, MD;  Location: [place] [facility];  Service: Cardiovascular;  Laterality: N/A;   LEFT HEART CATH AND CORONARY ANGIOGRAPHY N/A 07/22/2020   Procedure: LEFT HEART CATH AND CORONARY ANGIOGRAPHY;  Surgeon: Leonie Man, MD;  Location: [place] [facility];  Service: Cardiovascular;  Laterality: N/A;   LEFT HEART CATH AND CORS/GRAFTS ANGIOGRAPHY N/A 08/17/2021   Procedure: LEFT HEART CATH AND CORS/GRAFTS ANGIOGRAPHY;  Surgeon: Nelva Bush, MD;  Location: [place] [facility];  Service: Cardiovascular;  Laterality: N/A;   PERIPHERAL VASCULAR INTERVENTION Left 10/11/2020   popliteal and SFA stent placement    PERIPHERAL VASCULAR INTERVENTION Left 10/10/2020   Procedure: PERIPHERAL VASCULAR INTERVENTION;  Surgeon: Elam Dutch, MD;  Location: [place] [facility];  Service: Cardiovascular;  Laterality: Left;   RIGHT/LEFT HEART CATH AND CORONARY ANGIOGRAPHY N/A 05/30/2019   Procedure: RIGHT/LEFT HEART CATH AND CORONARY ANGIOGRAPHY;  Surgeon: Belva Crome, MD;  Location: [place] [facility];  Service: Cardiovascular;  Laterality: N/A;   SHOULDER OPEN ROTATOR CUFF REPAIR Bilateral    TEE WITHOUT CARDIOVERSION N/A 07/24/2020   Procedure: TRANSESOPHAGEAL ECHOCARDIOGRAM (TEE);  Surgeon: Lajuana Matte, MD;  Location: [place];  Service: Open Heart Surgery;  Laterality: N/A;   Social History   Socioeconomic History   Marital status: Married    Spouse name: Not on file   Number of children: Not on file   Years of education: Not on file   Highest education level: Not on file  Occupational History   Occupation: disabled     Employer: UNEMPLOYED  Tobacco Use    Smoking status: Former    Packs/day: 1.00    Years: 34.00    Pack years: 34.00    Types: Cigarettes    Quit date: 07/12/2020    Years since quitting: 1.4   Smokeless tobacco: Former    Types: Snuff, Chew  Vaping Use   Vaping Use: Never used  Substance and Sexual Activity   Alcohol use: Yes    Comment: RARE   Drug use: No   Sexual activity: Yes  Other Topics Concern   Not on file  Social History Narrative   Not on file   Social Determinants of Health   Financial Resource Strain: Not on file  Food Insecurity: Not on file  Transportation Needs: Not on file  Physical Activity: Not on file  Stress: Not on file  Social Connections: Not on file   Family History  Problem Relation Age of Onset   Hypertension Brother    Hypertension Father    Diabetes Other    Hyperlipidemia Other    - negative except otherwise stated in the family history section Allergies  Allergen Reactions   Sulfa Antibiotics Other (See Comments)    Headaches    Prior to Admission medications  Medication Sig Start Date End Date Taking? Authorizing Provider  albuterol (VENTOLIN HFA) 108 (90 Base) MCG/ACT inhaler Inhale 2 puffs into the lungs every 6 (six) hours as needed for wheezing or shortness of breath.   Yes [provider]  ALPRAZolam Duanne Moron) 1 MG tablet Take 0.5 tablets (0.5 mg total) by mouth at bedtime. 1 mg in the morning  2 mg at bedtime Patient taking differently: Take 1 mg by mouth 2 (two) times daily as needed for anxiety or sleep. 11/25/21  Yes Nita Sells, MD  aspirin EC 81 MG tablet Take 81 mg by mouth daily. Swallow whole.   Yes [provider]  atorvastatin (LIPITOR) 80 MG tablet Take 1 tablet (80 mg total) by mouth daily at 6 PM. 05/31/19  Yes Sande Rives E, PA-C  buPROPion (WELLBUTRIN SR) 150 MG 12 hr tablet Take 1 tablet (150 mg total) by mouth 2 (two) times daily. 11/25/21  Yes Nita Sells, MD  carvedilol (COREG) 3.125 MG tablet Take 3.125  mg by mouth 2 (two) times daily. 11/17/21  Yes [provider]  clopidogrel (PLAVIX) 75 MG tablet Take 1 tablet (75 mg total) by mouth daily with breakfast. 10/11/20  Yes Ulyses Amor, PA-C  doxycycline (VIBRA-TABS) 100 MG tablet Take 1 tablet (100 mg total) by mouth 2 (two) times daily. 12/25/21  Yes Suzan Slick, NP  fluticasone (FLONASE) 50 MCG/ACT nasal spray Place 2 sprays into both nostrils daily.   Yes [provider]  icosapent Ethyl (VASCEPA) 1 g capsule Take 2 capsules (2 g total) by mouth 2 (two) times daily. 09/02/21  Yes Skeet Latch, MD  insulin aspart (NOVOLOG) 100 UNIT/ML FlexPen Inject 6 Units into the skin 3 (three) times daily with meals. Per sliding scale 11/25/21  Yes Nita Sells, MD  insulin glargine (LANTUS) 100 UNIT/ML injection Inject 0.58 mLs (58 Units total) into the skin 2 (two) times daily. Patient taking differently: Inject 60 Units into the skin 2 (two) times daily. 11/25/21  Yes Nita Sells, MD  isosorbide mononitrate (IMDUR) 60 MG 24 hr tablet Take 1 tablet (60 mg total) by mouth daily. 11/25/21  Yes Nita Sells, MD  liraglutide (VICTOZA) 18 MG/3ML SOPN Inject 1.8 mg into the skin daily. Patient taking differently: Inject 1.2 mg into the skin daily. 07/17/21  Yes Shah, Pratik D, DO  loratadine (CLARITIN) 10 MG tablet Take 10 mg by mouth daily. 12/22/21  Yes [provider]  metFORMIN (GLUCOPHAGE) 1000 MG tablet Take 1 tablet (1,000 mg total) by mouth 2 (two) times daily with a meal. 11/05/19  Yes Isaiah Serge, NP  naloxone [facility]) nasal spray 4 mg/0.1 mL Place 4 mg into the nose as needed (overdose).   Yes [provider]  oxyCODONE (ROXICODONE) 15 MG immediate release tablet Take 15 mg by mouth 5 (five) times daily. 12/16/21  Yes [provider]  pantoprazole (PROTONIX) 40 MG tablet Take 1 tablet (40 mg total) by mouth daily. Patient taking differently: Take 80 mg by mouth daily. 09/23/17   Yes Hongalgi, Lenis Dickinson, MD  pregabalin (LYRICA) 150 MG capsule Take 2 capsules (300 mg total) by mouth in the morning. 11/25/21  Yes Nita Sells, MD  pregabalin (LYRICA) 150 MG capsule Take 1 capsule (150 mg total) by mouth at bedtime. 11/25/21  Yes Nita Sells, MD  ranolazine (RANEXA) 500 MG 12 hr tablet Take 1 tablet (500 mg total) by mouth 2 (two) times daily. 09/02/21  Yes Skeet Latch, MD  lamoTRIgine (LAMICTAL) 25  MG tablet Take 1 tablet (25 mg total) by mouth daily. 11/26/21   Samtani, Jai-Gurmukh, MD  °nutrition supplement, JUVEN, (JUVEN) PACK Take 1 packet by mouth 2 (two) times daily between meals. °Patient not taking: Reported on 01/06/2022 11/25/21   Samtani, Jai-Gurmukh, MD  ° °DG Chest 1 View ° °Result Date: 01/06/2022 °CLINICAL DATA:  Fall, chest pain EXAM: CHEST  1 VIEW COMPARISON:  Chest x-ray 11/17/2021 FINDINGS: Heart size and mediastinal contours are within normal limits. Cardiac surgical changes and median sternotomy wires. No suspicious pulmonary opacities identified. No pleural effusion or pneumothorax visualized. No acute osseous abnormality appreciated. IMPRESSION: No acute intrathoracic process identified. Electronically Signed   By: Delaney  Williams M.D.   On: 01/06/2022 10:56  ° °CT HEAD WO CONTRAST (5MM) ° °Result Date: 01/06/2022 °CLINICAL DATA:  Fall, head trauma EXAM: CT HEAD WITHOUT CONTRAST TECHNIQUE: Contiguous axial images were obtained from the base of the skull through the vertex without intravenous contrast. RADIATION DOSE REDUCTION: This exam was performed according to the departmental dose-optimization program which includes automated exposure control, adjustment of the mA and/or kV according to patient size and/or use of iterative reconstruction technique. COMPARISON:  09/18/2017 FINDINGS: Brain: No evidence of acute infarction, hemorrhage, hydrocephalus, extra-axial collection or mass lesion/mass effect. Vascular: No hyperdense vessel or unexpected  calcification. Skull: Normal. Negative for fracture or focal lesion. Sinuses/Orbits: No acute finding. Other: Moderately severe diffuse pansinus mucosal thickened with polypoid mucosal disease in the right maxillary sinus. Difficult to exclude acute on chronic sinusitis. Mastoids are clear. IMPRESSION: 1. No acute intracranial abnormality by noncontrast CT. 2. Pansinus disease as above. Electronically Signed   By: M.  Shick M.D.   On: 01/06/2022 10:29  ° °CT Angio Chest Pulmonary Embolism (PE) W or WO Contrast ° °Result Date: 01/06/2022 °CLINICAL DATA:  Syncope/presyncope.  Fall.  COVID. EXAM: CT ANGIOGRAPHY CHEST WITH CONTRAST TECHNIQUE: Multidetector CT imaging of the chest was performed using the standard protocol during bolus administration of intravenous contrast. Multiplanar CT image reconstructions and MIPs were obtained to evaluate the vascular anatomy. RADIATION DOSE REDUCTION: This exam was performed according to the departmental dose-optimization program which includes automated exposure control, adjustment of the mA and/or kV according to patient size and/or use of iterative reconstruction technique. CONTRAST:  65mL OMNIPAQUE IOHEXOL 350 MG/ML SOLN COMPARISON:  CT PE protocol 04/01/2014. FINDINGS: Cardiovascular: Examination is mildly technically limited secondary to timing of the contrast bolus. There is no definite pulmonary embolism to the segmental level. Smaller subsegmental pulmonary emboli can not be excluded. Mediastinum/Nodes: No enlarged mediastinal, hilar, or axillary lymph nodes. Thyroid gland, trachea, and esophagus demonstrate no significant findings. Lungs/Pleura: There are patchy multifocal ground-glass and nodular ground-glass opacities throughout both lungs. There is an 8 mm left upper lobe nodule image 8/32. There is no pleural effusion or pneumothorax. Trachea and central airways are patent. Upper Abdomen: No acute abnormality. Musculoskeletal: Sternotomy wires are present. Bilateral  gynecomastia. Review of the MIP images confirms the above findings. IMPRESSION: 1. Technically limited study secondary to timing of the contrast bolus. No evidence for pulmonary embolism to the segmental level. 2. Diffuse patchy multifocal ground-glass and nodular ground-glass opacities throughout both lungs, likely infectious/inflammatory. Findings may related to COVID infection. 3. 8 mm left solid pulmonary nodule within the upper lobe. Recommend a non-contrast Chest CT at 6-12 months. If patient is high risk for malignancy, recommend an additional non-contrast Chest CT at 18-24 months; if patient is low risk for malignancy a non-contrast Chest CT at 18-24 months   is optional. These guidelines do not apply to immunocompromised patients and patients with cancer. Follow up in patients with significant comorbidities as clinically warranted. For lung cancer screening, adhere to Lung-RADS guidelines. Reference: Radiology. 2017; 284(1):228-43. Electronically Signed   By: Ronney Asters M.D.   On: 01/06/2022 20:53   DG Foot Complete Left  Result Date: 01/06/2022 CLINICAL DATA:  Foot pain EXAM: LEFT FOOT - COMPLETE 3+ VIEW COMPARISON:  Left foot x-ray 11/17/2021 FINDINGS: Interval distal transmetatarsal surgical amputation. There is heterogeneous destructive appearance of the distal stumps of the second through fourth metatarsals with adjacent periosteal reaction. Marked soft tissue swelling of the foot stump. IMPRESSION: Findings suggestive of osteomyelitis involving the distal metatarsal stumps as described. Consider further evaluation with MRI. Electronically Signed   By: Ofilia Neas M.D.   On: 01/06/2022 10:54   DG Hip Unilat With Pelvis 2-3 Views Left  Result Date: 01/06/2022 CLINICAL DATA:  Fall, hip pain EXAM: DG HIP (WITH OR WITHOUT PELVIS) 2-3V LEFT COMPARISON:  None. FINDINGS: No acute fracture or dislocation identified. Mild narrowing of the hip joint space with acetabular subchondral sclerosis.  IMPRESSION: No acute osseous abnormality identified. Electronically Signed   By: Ofilia Neas M.D.   On: 01/06/2022 10:55   - pertinent xrays, CT, MRI studies were reviewed and independently interpreted  Positive ROS: All other systems have been reviewed and were otherwise negative with the exception of those mentioned in the HPI and as above.  Physical Exam: General: Alert, no acute distress Psychiatric: Patient is competent for consent with normal mood and affect Lymphatic: No axillary or cervical lymphadenopathy Cardiovascular: No pedal edema Respiratory: No cyanosis, no use of accessory musculature GI: No organomegaly, abdomen is soft and non-tender    Images:  @ENCIMAGES @  Labs:  Lab Results  Component Value Date   HGBA1C 10.0 (H) 11/18/2021   HGBA1C 11.8 (H) 08/15/2021   HGBA1C 13.7 (H) 07/10/2021   ESRSEDRATE 45 (H) 11/18/2021   ESRSEDRATE 41 (H) 09/16/2017   CRP 12.1 (H) 11/18/2021   CRP 8.9 (H) 09/16/2017   REPTSTATUS PENDING 01/06/2022   CULT  01/06/2022    NO GROWTH < 24 HOURS Performed at [facility], [place] [address], [place], [place] [address]     Lab Results  Component Value Date   ALBUMIN 2.4 (L) 01/06/2022   ALBUMIN 1.8 (L) 11/25/2021   ALBUMIN 2.6 (L) 11/17/2021     CBC EXTENDED Latest Ref Rng & Units 01/07/2022 01/06/2022 01/06/2022  WBC 4.0 - 10.5 K/uL 9.0 - 12.3(H)  RBC 4.22 - 5.81 MIL/uL 3.69(L) - 4.35  HGB 13.0 - 17.0 g/dL 9.2(L) 10.9(L) 10.6(L)  HCT 39.0 - 52.0 % 30.3(L) 32.0(L) 36.6(L)  PLT 150 - 400 K/uL 322 - 418(H)  NEUTROABS 1.7 - 7.7 K/uL - - 8.7(H)  LYMPHSABS 0.7 - 4.0 K/uL - - 2.2    Neurologic: Patient does not have protective sensation bilateral lower extremities.   MUSCULOSKELETAL:   Skin: Examination there is cellulitis to the hindfoot of the left transmetatarsal amputation.  There is dehiscence of the wound with exposed bone.  There is brawny edema in the calf consistent with venous insufficiency but no ulcers or  drainage in the leg.  Patient's hemoglobin is 9.2 white blood cell count 9.0 with an albumin of 2.4 and a hemoglobin A1c of 10.0.  Ankle-brachial indices 6 months ago showed adequate circulation to both lower extremities.  Assessment: Assessment: Progressive dehiscence cellulitis and exposed bone left transmetatarsal amputation.  Plan: We  will plan for a left below the knee amputation.  Risks and benefits were discussed including risk of the wound not healing.  Patient states he understands wished to proceed at this time.  Thank you for the consult and the opportunity to see Mr. Oji Casagrande, Daniel [phone] 11:21 AM

## 2022-01-07 NOTE — Progress Notes (Signed)
FPTS Brief Progress Note  S: Patient is resting comfortably on evaluation.   O: BP 110/80 (BP Location: Left Arm)    Pulse 77    Temp 98.6 F (37 C) (Oral)    Resp 18    Ht 6\' 5"  (1.956 m)    Wt (!) 196.9 kg    SpO2 98%    BMI 51.46 kg/m     A/P: Osteomyelitis Continuing vancomycin per pharmacy.  Plans for BKA with orthopedics on 1/19 - N.p.o. at midnight - Maintenance IV fluids initiated once patient is n.p.o. - Continue current pain regimen - Morning BMP monitoring creatinine  - Orders reviewed. Labs for AM ordered, which was adjusted as needed.   2/19, MD 01/07/2022, 10:03 PM PGY-3, [facility] Night Resident  Please page [phone] with questions.

## 2022-01-07 NOTE — Consult Note (Signed)
ORTHOPAEDIC CONSULTATION  REQUESTING PHYSICIAN: Kinnie Feil, MD  Chief Complaint: Cellulitis ulceration and drainage left foot.  HPI: Joseph [name] is a 51 y.o. male who presents with wound dehiscence cellulitis and drainage from the left transmetatarsal amputation.  Patient has had progressive redness swelling and drainage.  Past Medical History:  Diagnosis Date   Aneurysm of ascending aorta 09/14/2021   Anxiety    Arthritis    "knees, shoulders, hips, ankles" (07/29/2016)   Asthma    Bicuspid aortic valve 09/14/2021   CAD (coronary artery disease)    a. 2017: s/p BMS to distal Cx; b. LHC 05/30/2019: 80% mid, distal RCA s/p DES, 30% narrowing of d LM, widely patent LAD w/ luminal irregularities, widely patent stent in [place]  w/ 90+% stenosis distal to stent beofre small trifurcating obtuse marginal (potentially area of restenosis)   Chronic bronchitis (HCC)    Chronic ulcer of right great toe ([place]) 09/19/2017   COPD (chronic obstructive pulmonary disease) (HCC)    Depression    DVT (deep venous thrombosis) (HCC)    GERD (gastroesophageal reflux disease)    Hyperlipidemia    Hypertension    Myocardial infarction ([place]) 1996   "light one"   PE (pulmonary embolism) 04/2013   On chronic Xarelto   Peripheral nerve disease    Pneumonia "several times"   Sleep apnea    Type II diabetes mellitus ([place])    Past Surgical History:  Procedure Laterality Date   ABDOMINAL AORTOGRAM W/LOWER EXTREMITY N/A 10/10/2020   Procedure: ABDOMINAL AORTOGRAM W/LOWER EXTREMITY;  Surgeon: Elam Dutch, MD;  Location: [place] [facility];  Service: Cardiovascular;  Laterality: N/A;   AMPUTATION Right 09/21/2017   Procedure: RIGHT GREAT TOE AMPUTATION, POSSIBLE VAC;  Surgeon: Leandrew Koyanagi, MD;  Location: [place];  Service: Orthopedics;  Laterality: Right;   AMPUTATION Left 08/13/2020   Procedure: LEFT FOOT 4TH RAY AMPUTATION;  Surgeon: Newt Minion, MD;  Location: [place];  Service: Orthopedics;   Laterality: Left;   AMPUTATION Left 11/20/2021   Procedure: LEFT TRANSMETATARSAL AMPUTATION;  Surgeon: Newt Minion, MD;  Location: [place];  Service: Orthopedics;  Laterality: Left;   AORTOGRAM Bilateral 03/13/2021   Procedure: ABDOMINAL AORTOGRAM WITH Left LOWER EXTREMITY RUNOFF;  Surgeon: Cherre Robins, MD;  Location: [facility];  Service: Vascular;  Laterality: Bilateral;   CARDIAC CATHETERIZATION  2006   [place]   "@ [facility]; when I had my heart attack"   CARDIAC CATHETERIZATION N/A 07/29/2016   Procedure: Left Heart Cath and Coronary Angiography;  Surgeon: Lorretta Harp, MD;  Location: [place] [facility];  Service: Cardiovascular;  Laterality: N/A;   CARDIAC CATHETERIZATION N/A 07/29/2016   Procedure: Coronary Stent Intervention;  Surgeon: Lorretta Harp, MD;  Location: [place] [facility];  Service: Cardiovascular;  Laterality: N/A;   CARPAL TUNNEL RELEASE Bilateral    CORONARY ANGIOPLASTY WITH STENT PLACEMENT  07/29/2016   CORONARY ARTERY BYPASS GRAFT N/A 07/24/2020   Procedure: CORONARY ARTERY BYPASS GRAFTING (CABG), ON PUMP, TIMES FOUR, USING LEFT INTERNAL MAMMARY ARTERY AND ENDOSCOPICALLY HARVESTED RIGHT GREATER SAPHENOUS VEIN;  Surgeon: Lajuana Matte, MD;  Location: [place];  Service: Open Heart Surgery;  Laterality: N/A;  FLOW TAC   CORONARY STENT INTERVENTION N/A 05/30/2019   Procedure: CORONARY STENT INTERVENTION;  Surgeon: Belva Crome, MD;  Location: [place] [facility];  Service: Cardiovascular;  Laterality: N/A;   CORONARY STENT INTERVENTION N/A 11/02/2019   Procedure: CORONARY  STENT INTERVENTION;  Surgeon: Wellington Hampshire, MD;  Location: [place] [facility];  Service: Cardiovascular;  Laterality: N/A;   ESOPHAGOGASTRODUODENOSCOPY N/A 09/22/2017   Procedure: ESOPHAGOGASTRODUODENOSCOPY (EGD);  Surgeon: Ladene Artist, MD;  Location: [facility];  Service: Endoscopy;  Laterality: N/A;   INTRAVASCULAR PRESSURE WIRE/FFR STUDY N/A 07/22/2020   Procedure:  INTRAVASCULAR PRESSURE WIRE/FFR STUDY;  Surgeon: Leonie Man, MD;  Location: [place] [facility];  Service: Cardiovascular;  Laterality: N/A;   KNEE ARTHROSCOPY Bilateral    "2 on left; 1 on the right"   LEFT HEART CATH AND CORONARY ANGIOGRAPHY N/A 11/02/2019   Procedure: LEFT HEART CATH AND CORONARY ANGIOGRAPHY;  Surgeon: Wellington Hampshire, MD;  Location: [place] [facility];  Service: Cardiovascular;  Laterality: N/A;   LEFT HEART CATH AND CORONARY ANGIOGRAPHY N/A 07/22/2020   Procedure: LEFT HEART CATH AND CORONARY ANGIOGRAPHY;  Surgeon: Leonie Man, MD;  Location: [place] [facility];  Service: Cardiovascular;  Laterality: N/A;   LEFT HEART CATH AND CORS/GRAFTS ANGIOGRAPHY N/A 08/17/2021   Procedure: LEFT HEART CATH AND CORS/GRAFTS ANGIOGRAPHY;  Surgeon: Nelva Bush, MD;  Location: [place] [facility];  Service: Cardiovascular;  Laterality: N/A;   PERIPHERAL VASCULAR INTERVENTION Left 10/11/2020   popliteal and SFA stent placement    PERIPHERAL VASCULAR INTERVENTION Left 10/10/2020   Procedure: PERIPHERAL VASCULAR INTERVENTION;  Surgeon: Elam Dutch, MD;  Location: [place] [facility];  Service: Cardiovascular;  Laterality: Left;   RIGHT/LEFT HEART CATH AND CORONARY ANGIOGRAPHY N/A 05/30/2019   Procedure: RIGHT/LEFT HEART CATH AND CORONARY ANGIOGRAPHY;  Surgeon: Belva Crome, MD;  Location: [place] [facility];  Service: Cardiovascular;  Laterality: N/A;   SHOULDER OPEN ROTATOR CUFF REPAIR Bilateral    TEE WITHOUT CARDIOVERSION N/A 07/24/2020   Procedure: TRANSESOPHAGEAL ECHOCARDIOGRAM (TEE);  Surgeon: Lajuana Matte, MD;  Location: [place];  Service: Open Heart Surgery;  Laterality: N/A;   Social History   Socioeconomic History   Marital status: Married    Spouse name: Not on file   Number of children: Not on file   Years of education: Not on file   Highest education level: Not on file  Occupational History   Occupation: disabled     Employer: UNEMPLOYED  Tobacco Use    Smoking status: Former    Packs/day: 1.00    Years: 34.00    Pack years: 34.00    Types: Cigarettes    Quit date: 07/12/2020    Years since quitting: 1.4   Smokeless tobacco: Former    Types: Snuff, Chew  Vaping Use   Vaping Use: Never used  Substance and Sexual Activity   Alcohol use: Yes    Comment: RARE   Drug use: No   Sexual activity: Yes  Other Topics Concern   Not on file  Social History Narrative   Not on file   Social Determinants of Health   Financial Resource Strain: Not on file  Food Insecurity: Not on file  Transportation Needs: Not on file  Physical Activity: Not on file  Stress: Not on file  Social Connections: Not on file   Family History  Problem Relation Age of Onset   Hypertension Brother    Hypertension Father    Diabetes Other    Hyperlipidemia Other    - negative except otherwise stated in the family history section Allergies  Allergen Reactions   Sulfa Antibiotics Other (See Comments)    Headaches    Prior to Admission medications  Medication Sig Start Date End Date Taking? Authorizing Provider  albuterol (VENTOLIN HFA) 108 (90 Base) MCG/ACT inhaler Inhale 2 puffs into the lungs every 6 (six) hours as needed for wheezing or shortness of breath.   Yes [provider]  ALPRAZolam Duanne Moron) 1 MG tablet Take 0.5 tablets (0.5 mg total) by mouth at bedtime. 1 mg in the morning  2 mg at bedtime Patient taking differently: Take 1 mg by mouth 2 (two) times daily as needed for anxiety or sleep. 11/25/21  Yes Nita Sells, MD  aspirin EC 81 MG tablet Take 81 mg by mouth daily. Swallow whole.   Yes [provider]  atorvastatin (LIPITOR) 80 MG tablet Take 1 tablet (80 mg total) by mouth daily at 6 PM. 05/31/19  Yes Sande Rives E, PA-C  buPROPion (WELLBUTRIN SR) 150 MG 12 hr tablet Take 1 tablet (150 mg total) by mouth 2 (two) times daily. 11/25/21  Yes Nita Sells, MD  carvedilol (COREG) 3.125 MG tablet Take 3.125  mg by mouth 2 (two) times daily. 11/17/21  Yes [provider]  clopidogrel (PLAVIX) 75 MG tablet Take 1 tablet (75 mg total) by mouth daily with breakfast. 10/11/20  Yes Ulyses Amor, PA-C  doxycycline (VIBRA-TABS) 100 MG tablet Take 1 tablet (100 mg total) by mouth 2 (two) times daily. 12/25/21  Yes Suzan Slick, NP  fluticasone (FLONASE) 50 MCG/ACT nasal spray Place 2 sprays into both nostrils daily.   Yes [provider]  icosapent Ethyl (VASCEPA) 1 g capsule Take 2 capsules (2 g total) by mouth 2 (two) times daily. 09/02/21  Yes Skeet Latch, MD  insulin aspart (NOVOLOG) 100 UNIT/ML FlexPen Inject 6 Units into the skin 3 (three) times daily with meals. Per sliding scale 11/25/21  Yes Nita Sells, MD  insulin glargine (LANTUS) 100 UNIT/ML injection Inject 0.58 mLs (58 Units total) into the skin 2 (two) times daily. Patient taking differently: Inject 60 Units into the skin 2 (two) times daily. 11/25/21  Yes Nita Sells, MD  isosorbide mononitrate (IMDUR) 60 MG 24 hr tablet Take 1 tablet (60 mg total) by mouth daily. 11/25/21  Yes Nita Sells, MD  liraglutide (VICTOZA) 18 MG/3ML SOPN Inject 1.8 mg into the skin daily. Patient taking differently: Inject 1.2 mg into the skin daily. 07/17/21  Yes Shah, Pratik D, DO  loratadine (CLARITIN) 10 MG tablet Take 10 mg by mouth daily. 12/22/21  Yes [provider]  metFORMIN (GLUCOPHAGE) 1000 MG tablet Take 1 tablet (1,000 mg total) by mouth 2 (two) times daily with a meal. 11/05/19  Yes Isaiah Serge, NP  naloxone [facility]) nasal spray 4 mg/0.1 mL Place 4 mg into the nose as needed (overdose).   Yes [provider]  oxyCODONE (ROXICODONE) 15 MG immediate release tablet Take 15 mg by mouth 5 (five) times daily. 12/16/21  Yes [provider]  pantoprazole (PROTONIX) 40 MG tablet Take 1 tablet (40 mg total) by mouth daily. Patient taking differently: Take 80 mg by mouth daily. 09/23/17   Yes Hongalgi, Lenis Dickinson, MD  pregabalin (LYRICA) 150 MG capsule Take 2 capsules (300 mg total) by mouth in the morning. 11/25/21  Yes Nita Sells, MD  pregabalin (LYRICA) 150 MG capsule Take 1 capsule (150 mg total) by mouth at bedtime. 11/25/21  Yes Nita Sells, MD  ranolazine (RANEXA) 500 MG 12 hr tablet Take 1 tablet (500 mg total) by mouth 2 (two) times daily. 09/02/21  Yes Skeet Latch, MD  lamoTRIgine (LAMICTAL) 25  MG tablet Take 1 tablet (25 mg total) by mouth daily. 11/26/21   Nita Sells, MD  nutrition supplement, JUVEN, (JUVEN) PACK Take 1 packet by mouth 2 (two) times daily between meals. Patient not taking: Reported on 01/06/2022 11/25/21   Nita Sells, MD   DG Chest 1 View  Result Date: 01/06/2022 CLINICAL DATA:  Fall, chest pain EXAM: CHEST  1 VIEW COMPARISON:  Chest x-ray 11/17/2021 FINDINGS: Heart size and mediastinal contours are within normal limits. Cardiac surgical changes and median sternotomy wires. No suspicious pulmonary opacities identified. No pleural effusion or pneumothorax visualized. No acute osseous abnormality appreciated. IMPRESSION: No acute intrathoracic process identified. Electronically Signed   By: Ofilia Neas M.D.   On: 01/06/2022 10:56   CT HEAD WO CONTRAST (5MM)  Result Date: 01/06/2022 CLINICAL DATA:  Fall, head trauma EXAM: CT HEAD WITHOUT CONTRAST TECHNIQUE: Contiguous axial images were obtained from the base of the skull through the vertex without intravenous contrast. RADIATION DOSE REDUCTION: This exam was performed according to the departmental dose-optimization program which includes automated exposure control, adjustment of the mA and/or kV according to patient size and/or use of iterative reconstruction technique. COMPARISON:  09/18/2017 FINDINGS: Brain: No evidence of acute infarction, hemorrhage, hydrocephalus, extra-axial collection or mass lesion/mass effect. Vascular: No hyperdense vessel or unexpected  calcification. Skull: Normal. Negative for fracture or focal lesion. Sinuses/Orbits: No acute finding. Other: Moderately severe diffuse pansinus mucosal thickened with polypoid mucosal disease in the right maxillary sinus. Difficult to exclude acute on chronic sinusitis. Mastoids are clear. IMPRESSION: 1. No acute intracranial abnormality by noncontrast CT. 2. Pansinus disease as above. Electronically Signed   By: Jerilynn Mages.  Shick M.D.   On: 01/06/2022 10:29   CT Angio Chest Pulmonary Embolism (PE) W or WO Contrast  Result Date: 01/06/2022 CLINICAL DATA:  Syncope/presyncope.  Fall.  COVID. EXAM: CT ANGIOGRAPHY CHEST WITH CONTRAST TECHNIQUE: Multidetector CT imaging of the chest was performed using the standard protocol during bolus administration of intravenous contrast. Multiplanar CT image reconstructions and MIPs were obtained to evaluate the vascular anatomy. RADIATION DOSE REDUCTION: This exam was performed according to the departmental dose-optimization program which includes automated exposure control, adjustment of the mA and/or kV according to patient size and/or use of iterative reconstruction technique. CONTRAST:  47mL OMNIPAQUE IOHEXOL 350 MG/ML SOLN COMPARISON:  CT PE protocol 04/01/2014. FINDINGS: Cardiovascular: Examination is mildly technically limited secondary to timing of the contrast bolus. There is no definite pulmonary embolism to the segmental level. Smaller subsegmental pulmonary emboli can not be excluded. Mediastinum/Nodes: No enlarged mediastinal, hilar, or axillary lymph nodes. Thyroid gland, trachea, and esophagus demonstrate no significant findings. Lungs/Pleura: There are patchy multifocal ground-glass and nodular ground-glass opacities throughout both lungs. There is an 8 mm left upper lobe nodule image 8/32. There is no pleural effusion or pneumothorax. Trachea and central airways are patent. Upper Abdomen: No acute abnormality. Musculoskeletal: Sternotomy wires are present. Bilateral  gynecomastia. Review of the MIP images confirms the above findings. IMPRESSION: 1. Technically limited study secondary to timing of the contrast bolus. No evidence for pulmonary embolism to the segmental level. 2. Diffuse patchy multifocal ground-glass and nodular ground-glass opacities throughout both lungs, likely infectious/inflammatory. Findings may related to COVID infection. 3. 8 mm left solid pulmonary nodule within the upper lobe. Recommend a non-contrast Chest CT at 6-12 months. If patient is high risk for malignancy, recommend an additional non-contrast Chest CT at 18-24 months; if patient is low risk for malignancy a non-contrast Chest CT at 18-24 months  is optional. These guidelines do not apply to immunocompromised patients and patients with cancer. Follow up in patients with significant comorbidities as clinically warranted. For lung cancer screening, adhere to Lung-RADS guidelines. Reference: Radiology. 2017; 284(1):228-43. Electronically Signed   By: Ronney Asters M.D.   On: 01/06/2022 20:53   DG Foot Complete Left  Result Date: 01/06/2022 CLINICAL DATA:  Foot pain EXAM: LEFT FOOT - COMPLETE 3+ VIEW COMPARISON:  Left foot x-ray 11/17/2021 FINDINGS: Interval distal transmetatarsal surgical amputation. There is heterogeneous destructive appearance of the distal stumps of the second through fourth metatarsals with adjacent periosteal reaction. Marked soft tissue swelling of the foot stump. IMPRESSION: Findings suggestive of osteomyelitis involving the distal metatarsal stumps as described. Consider further evaluation with MRI. Electronically Signed   By: Ofilia Neas M.D.   On: 01/06/2022 10:54   DG Hip Unilat With Pelvis 2-3 Views Left  Result Date: 01/06/2022 CLINICAL DATA:  Fall, hip pain EXAM: DG HIP (WITH OR WITHOUT PELVIS) 2-3V LEFT COMPARISON:  None. FINDINGS: No acute fracture or dislocation identified. Mild narrowing of the hip joint space with acetabular subchondral sclerosis.  IMPRESSION: No acute osseous abnormality identified. Electronically Signed   By: Ofilia Neas M.D.   On: 01/06/2022 10:55   - pertinent xrays, CT, MRI studies were reviewed and independently interpreted  Positive ROS: All other systems have been reviewed and were otherwise negative with the exception of those mentioned in the HPI and as above.  Physical Exam: General: Alert, no acute distress Psychiatric: Patient is competent for consent with normal mood and affect Lymphatic: No axillary or cervical lymphadenopathy Cardiovascular: No pedal edema Respiratory: No cyanosis, no use of accessory musculature GI: No organomegaly, abdomen is soft and non-tender    Images:  @ENCIMAGES @  Labs:  Lab Results  Component Value Date   HGBA1C 10.0 (H) 11/18/2021   HGBA1C 11.8 (H) 08/15/2021   HGBA1C 13.7 (H) 07/10/2021   ESRSEDRATE 45 (H) 11/18/2021   ESRSEDRATE 41 (H) 09/16/2017   CRP 12.1 (H) 11/18/2021   CRP 8.9 (H) 09/16/2017   REPTSTATUS PENDING 01/06/2022   CULT  01/06/2022    NO GROWTH < 24 HOURS Performed at [facility], [place] [address], [place], [place] [address]     Lab Results  Component Value Date   ALBUMIN 2.4 (L) 01/06/2022   ALBUMIN 1.8 (L) 11/25/2021   ALBUMIN 2.6 (L) 11/17/2021     CBC EXTENDED Latest Ref Rng & Units 01/07/2022 01/06/2022 01/06/2022  WBC 4.0 - 10.5 K/uL 9.0 - 12.3(H)  RBC 4.22 - 5.81 MIL/uL 3.69(L) - 4.35  HGB 13.0 - 17.0 g/dL 9.2(L) 10.9(L) 10.6(L)  HCT 39.0 - 52.0 % 30.3(L) 32.0(L) 36.6(L)  PLT 150 - 400 K/uL 322 - 418(H)  NEUTROABS 1.7 - 7.7 K/uL - - 8.7(H)  LYMPHSABS 0.7 - 4.0 K/uL - - 2.2    Neurologic: Patient does not have protective sensation bilateral lower extremities.   MUSCULOSKELETAL:   Skin: Examination there is cellulitis to the hindfoot of the left transmetatarsal amputation.  There is dehiscence of the wound with exposed bone.  There is brawny edema in the calf consistent with venous insufficiency but no ulcers or  drainage in the leg.  Patient's hemoglobin is 9.2 white blood cell count 9.0 with an albumin of 2.4 and a hemoglobin A1c of 10.0.  Ankle-brachial indices 6 months ago showed adequate circulation to both lower extremities.  Assessment: Assessment: Progressive dehiscence cellulitis and exposed bone left transmetatarsal amputation.  Plan: We  will plan for a left below the knee amputation.  Risks and benefits were discussed including risk of the wound not healing.  Patient states he understands wished to proceed at this time.  Thank you for the consult and the opportunity to see [name], [place] [phone] 11:21 AM

## 2022-01-07 NOTE — Progress Notes (Signed)
Family Medicine Teaching Service Daily Progress Note Intern Pager: [phone]  Patient name: Joseph [name] Medical record number: [id number] Date of birth: 12-30-1970 Age: 51 y.o. Gender: male  Primary Care Provider: Marva Panda, NP Consultants: Ortho Code Status: Full  Pt Overview and Major Events to Date:  1/19-admitted  Assessment and Plan:  Joseph [name] is a 51 year old male presenting with weakness and fall.  PMH is significant for CAD s/p CABG x4, PAD, PE, T2DM, HTN, HLD, COPD, GERD, left toe amputation   Fall with concern for possible syncope Patient had no signs of ACS or arrhythmia.  CTPE showed no evidence of PE but found 8 mm nodule, patient to follow-up outpatient with plan to repeat CT in 6 to 12 months.  Low concerns for seizure or stroke.  Likely hypotension in the setting of ongoing infection.  Cardiac and pulmonary exam are normal.  This morning patient denies feeling dizzy or having any palpitations.  Cardiac and pulmonary exam were normal -Continuous cardiac monitoring -Continue routine vitals per floor -Continue antibiotic treatment for sepsis -Follow up echo -Up with assistance -Continue fall precautions -Continue monitoring labs CBC, BMP  Left foot infection  sepsis Patient is s/p Vancomycin and cefepime.  Remains afebrile overnight with improved blood pressure.  He reports no significant pain on the left foot.  Foot is erythematous, edematous, and draining pus.  Orthopedic is following and plan to complete a BKA procedure tomorrow.  We will continue to follow the recommendations.  - Orthopedic following, appreciate recs -Monitor fever cough -Continue cardiac monitoring -Start Vanco, cefepime, Zosyn. -NPO at midnight for BKA tomorrow -Start IVF later this evening -Continue Tylenol 650 mg q6h PRN  COVID-19 positive She was positive on admission.  Endorses generalized weakness.  T2DM CBG in the last 24 hours have ranged from 114-136.  This morning  CBG is 136.  On medication, Victoza 1.8 mg daily and metformin 1000 mg twice daily. -Holding metformin due to AKI -Continue monitoring CBG  AKI Creatinine this morning was 1.98 which is improved from admission.  His baseline creatinine is~1.4 -Avoid nephrotoxic agent, hold metformin -Continue morning lab, BMP  Hyperkalemia Patient's potassium this morning improved to 5.5 from 6.3 on admission. He is s/p Lokelma. -Continue Lokelma 10 mg daily -Continue cardiac monitoring -Continue daily lab, BMP  CAD s/p CABG   GAD Patient endorses mild chest pain this morning which he located at the epigastric area. Could be likely due to GERD,. No palpitation and Cardiac exam was normal. Home medication include atorvastatin 80 mg daily, Vascepa 2 g twice daily. -Continue home medication  Other chronic and stable conditions Bipolar with depression disorder GERD COPD  FEN/GI: Heart healthy/carb modified diet PPx: Heparin Dispo:Home pending clinical improvement . Barriers include clinical status.   Subjective:  Joseph [name] and laying in bed.  Say he is doing fine but anxious about the news of getting a BKA tomorrow.  He mild dyspnea and chest pain but denies lightheadedness or dizziness.  Objective: Temp:  [97.4 F (36.3 C)-98.7 F (37.1 C)] 98.1 F (36.7 C) (01/19 0727) Pulse Rate:  [77-153] 85 (01/19 0727) Resp:  [11-21] 21 (01/19 0727) BP: (73-133)/(50-94) 99/74 (01/19 0727) SpO2:  [90 %-100 %] 99 % (01/19 0727) Weight:  [196.9 kg] 196.9 kg (01/18 0938) Physical Exam: General:Awake, well appearing, NAD HEENT: Atraumatic, MMM, No sclera icterus CV: RRR, no murmurs, normal S1/S2 Pulm: CTAB, good WOB on RA, no crackles or wheezing Abd: Soft, no distension, no tenderness Skin: dry,  warm ZOX:WRUEExt:Left foot is edematous, erythematous and draining pus. Erythematous up to shin. Right foot shows amputated toe   Laboratory: Recent Labs  Lab 01/06/22 0949 01/06/22 1035 01/07/22 0336   WBC 12.3*  --  9.0  HGB 10.6* 10.9* 9.2*  HCT 36.6* 32.0* 30.3*  PLT 418*  --  322   Recent Labs  Lab 01/06/22 0949 01/06/22 1035 01/06/22 1600 01/07/22 0336  NA 137 138 138 139  K 6.3* 6.8* 5.9* 5.5*  CL 104  --  105 108  CO2 25  --  25 23  BUN 45*  --  43* 39*  CREATININE 2.22*  --  2.09* 1.98*  CALCIUM 8.6*  --  8.5* 8.2*  PROT 7.1  --   --   --   BILITOT 0.4  --   --   --   ALKPHOS 88  --   --   --   ALT 13  --   --   --   AST 22  --   --   --   GLUCOSE 170*  --  137* 133*     Imaging/Diagnostic Tests: DG Chest 1 View  Result Date: 01/06/2022 CLINICAL DATA:  Fall, chest pain EXAM: CHEST  1 VIEW COMPARISON:  Chest x-ray 11/17/2021 FINDINGS: Heart size and mediastinal contours are within normal limits. Cardiac surgical changes and median sternotomy wires. No suspicious pulmonary opacities identified. No pleural effusion or pneumothorax visualized. No acute osseous abnormality appreciated. IMPRESSION: No acute intrathoracic process identified. Electronically Signed   By: Jannifer Hickelaney  Williams M.D.   On: 01/06/2022 10:56   CT HEAD WO CONTRAST (5MM)  Result Date: 01/06/2022 CLINICAL DATA:  Fall, head trauma EXAM: CT HEAD WITHOUT CONTRAST TECHNIQUE: Contiguous axial images were obtained from the base of the skull through the vertex without intravenous contrast. RADIATION DOSE REDUCTION: This exam was performed according to the departmental dose-optimization program which includes automated exposure control, adjustment of the mA and/or kV according to patient size and/or use of iterative reconstruction technique. COMPARISON:  09/18/2017 FINDINGS: Brain: No evidence of acute infarction, hemorrhage, hydrocephalus, extra-axial collection or mass lesion/mass effect. Vascular: No hyperdense vessel or unexpected calcification. Skull: Normal. Negative for fracture or focal lesion. Sinuses/Orbits: No acute finding. Other: Moderately severe diffuse pansinus mucosal thickened with polypoid mucosal  disease in the right maxillary sinus. Difficult to exclude acute on chronic sinusitis. Mastoids are clear. IMPRESSION: 1. No acute intracranial abnormality by noncontrast CT. 2. Pansinus disease as above. Electronically Signed   By: Judie PetitM.  Shick M.D.   On: 01/06/2022 10:29   CT Angio Chest Pulmonary Embolism (PE) W or WO Contrast  Result Date: 01/06/2022 CLINICAL DATA:  Syncope/presyncope.  Fall.  COVID. EXAM: CT ANGIOGRAPHY CHEST WITH CONTRAST TECHNIQUE: Multidetector CT imaging of the chest was performed using the standard protocol during bolus administration of intravenous contrast. Multiplanar CT image reconstructions and MIPs were obtained to evaluate the vascular anatomy. RADIATION DOSE REDUCTION: This exam was performed according to the departmental dose-optimization program which includes automated exposure control, adjustment of the mA and/or kV according to patient size and/or use of iterative reconstruction technique. CONTRAST:  65mL OMNIPAQUE IOHEXOL 350 MG/ML SOLN COMPARISON:  CT PE protocol 04/01/2014. FINDINGS: Cardiovascular: Examination is mildly technically limited secondary to timing of the contrast bolus. There is no definite pulmonary embolism to the segmental level. Smaller subsegmental pulmonary emboli can not be excluded. Mediastinum/Nodes: No enlarged mediastinal, hilar, or axillary lymph nodes. Thyroid gland, trachea, and esophagus demonstrate no significant findings. Lungs/Pleura:  There are patchy multifocal ground-glass and nodular ground-glass opacities throughout both lungs. There is an 8 mm left upper lobe nodule image 8/32. There is no pleural effusion or pneumothorax. Trachea and central airways are patent. Upper Abdomen: No acute abnormality. Musculoskeletal: Sternotomy wires are present. Bilateral gynecomastia. Review of the MIP images confirms the above findings. IMPRESSION: 1. Technically limited study secondary to timing of the contrast bolus. No evidence for pulmonary embolism  to the segmental level. 2. Diffuse patchy multifocal ground-glass and nodular ground-glass opacities throughout both lungs, likely infectious/inflammatory. Findings may related to COVID infection. 3. 8 mm left solid pulmonary nodule within the upper lobe. Recommend a non-contrast Chest CT at 6-12 months. If patient is high risk for malignancy, recommend an additional non-contrast Chest CT at 18-24 months; if patient is low risk for malignancy a non-contrast Chest CT at 18-24 months is optional. These guidelines do not apply to immunocompromised patients and patients with cancer. Follow up in patients with significant comorbidities as clinically warranted. For lung cancer screening, adhere to Lung-RADS guidelines. Reference: Radiology. 2017; 284(1):228-43. Electronically Signed   By: Darliss Cheney M.D.   On: 01/06/2022 20:53   DG Foot Complete Left  Result Date: 01/06/2022 CLINICAL DATA:  Foot pain EXAM: LEFT FOOT - COMPLETE 3+ VIEW COMPARISON:  Left foot x-ray 11/17/2021 FINDINGS: Interval distal transmetatarsal surgical amputation. There is heterogeneous destructive appearance of the distal stumps of the second through fourth metatarsals with adjacent periosteal reaction. Marked soft tissue swelling of the foot stump. IMPRESSION: Findings suggestive of osteomyelitis involving the distal metatarsal stumps as described. Consider further evaluation with MRI. Electronically Signed   By: Jannifer Hick M.D.   On: 01/06/2022 10:54   DG Hip Unilat With Pelvis 2-3 Views Left  Result Date: 01/06/2022 CLINICAL DATA:  Fall, hip pain EXAM: DG HIP (WITH OR WITHOUT PELVIS) 2-3V LEFT COMPARISON:  None. FINDINGS: No acute fracture or dislocation identified. Mild narrowing of the hip joint space with acetabular subchondral sclerosis. IMPRESSION: No acute osseous abnormality identified. Electronically Signed   By: Jannifer Hick M.D.   On: 01/06/2022 10:55     Jerre Simon, MD  01/07/2022, 7:46 AM PGY-1, [facility] FPTS Intern pager: [phone], text pages welcome

## 2022-01-07 NOTE — ED Notes (Signed)
Breakfast tray delivered to pt at this time.  

## 2022-01-08 ENCOUNTER — Encounter (HOSPITAL_COMMUNITY)
Admission: EM | Disposition: A | Discharge status: Skilled Nursing Facility | Payer: Self-pay | Source: Home / Self Care | Attending: Family Medicine

## 2022-01-08 ENCOUNTER — Inpatient Hospital Stay (HOSPITAL_COMMUNITY): Payer: Medicare HMO | Admitting: Anesthesiology

## 2022-01-08 HISTORY — PX: APPLICATION OF WOUND VAC: SHX5189

## 2022-01-08 HISTORY — PX: AMPUTATION: SHX166

## 2022-01-08 LAB — URINE CULTURE: Culture: 3000 — AB

## 2022-01-08 LAB — GLUCOSE, CAPILLARY
Glucose-Capillary: 105 mg/dL — ABNORMAL HIGH (ref 70–99)
Glucose-Capillary: 108 mg/dL — ABNORMAL HIGH (ref 70–99)
Glucose-Capillary: 113 mg/dL — ABNORMAL HIGH (ref 70–99)
Glucose-Capillary: 158 mg/dL — ABNORMAL HIGH (ref 70–99)
Glucose-Capillary: 225 mg/dL — ABNORMAL HIGH (ref 70–99)
Glucose-Capillary: 227 mg/dL — ABNORMAL HIGH (ref 70–99)
Glucose-Capillary: 239 mg/dL — ABNORMAL HIGH (ref 70–99)
Glucose-Capillary: 315 mg/dL — ABNORMAL HIGH (ref 70–99)

## 2022-01-08 LAB — CBC
HCT: 29.7 % — ABNORMAL LOW (ref 39.0–52.0)
Hemoglobin: 9.1 g/dL — ABNORMAL LOW (ref 13.0–17.0)
MCH: 24.5 pg — ABNORMAL LOW (ref 26.0–34.0)
MCHC: 30.6 g/dL (ref 30.0–36.0)
MCV: 80.1 fL (ref 80.0–100.0)
Platelets: 347 10*3/uL (ref 150–400)
RBC: 3.71 MIL/uL — ABNORMAL LOW (ref 4.22–5.81)
RDW: 16.4 % — ABNORMAL HIGH (ref 11.5–15.5)
WBC: 6.7 10*3/uL (ref 4.0–10.5)
nRBC: 0 % (ref 0.0–0.2)

## 2022-01-08 LAB — BASIC METABOLIC PANEL
Anion gap: 8 (ref 5–15)
BUN: 29 mg/dL — ABNORMAL HIGH (ref 6–20)
CO2: 24 mmol/L (ref 22–32)
Calcium: 8.7 mg/dL — ABNORMAL LOW (ref 8.9–10.3)
Chloride: 107 mmol/L (ref 98–111)
Creatinine, Ser: 1.45 mg/dL — ABNORMAL HIGH (ref 0.61–1.24)
GFR, Estimated: 59 mL/min — ABNORMAL LOW (ref >60)
Glucose, Bld: 232 mg/dL — ABNORMAL HIGH (ref 70–99)
Potassium: 4.8 mmol/L (ref 3.5–5.1)
Sodium: 139 mmol/L (ref 135–145)

## 2022-01-08 LAB — SURGICAL PCR SCREEN
MRSA, PCR: POSITIVE — AB
Staphylococcus aureus: POSITIVE — AB

## 2022-01-08 LAB — HEMOGLOBIN A1C
Hgb A1c MFr Bld: 8.4 % — ABNORMAL HIGH (ref 4.8–5.6)
Mean Plasma Glucose: 194 mg/dL

## 2022-01-08 LAB — VITAMIN D 25 HYDROXY (VIT D DEFICIENCY, FRACTURES): Vit D, 25-Hydroxy: 9.4 ng/mL — ABNORMAL LOW (ref 30–100)

## 2022-01-08 SURGERY — AMPUTATION BELOW KNEE
Anesthesia: General | Site: Knee | Laterality: Left

## 2022-01-08 MED ORDER — PHENYLEPHRINE 40 MCG/ML (10ML) SYRINGE FOR IV PUSH (FOR BLOOD PRESSURE SUPPORT)
PREFILLED_SYRINGE | INTRAVENOUS | Status: AC
  Filled 2022-01-08: qty 10

## 2022-01-08 MED ORDER — FENTANYL CITRATE (PF) 100 MCG/2ML IJ SOLN
INTRAMUSCULAR | Status: AC
  Administered 2022-01-08: 50 ug via INTRAVENOUS
  Filled 2022-01-08: qty 2

## 2022-01-08 MED ORDER — HYDRALAZINE HCL 20 MG/ML IJ SOLN: 5.0000 mg | INTRAMUSCULAR | Status: DC | PRN

## 2022-01-08 MED ORDER — ASCORBIC ACID 500 MG PO TABS
1000.0000 mg | ORAL_TABLET | Freq: Every day | ORAL | Status: DC
  Administered 2022-01-08 – 2022-01-14 (×7): 1000 mg via ORAL
  Filled 2022-01-08 (×7): qty 2

## 2022-01-08 MED ORDER — KETAMINE HCL 10 MG/ML IJ SOLN
INTRAMUSCULAR | Status: DC | PRN
  Administered 2022-01-08: 30 mg via INTRAVENOUS

## 2022-01-08 MED ORDER — OXYCODONE HCL 5 MG PO TABS: 5.0000 mg | ORAL_TABLET | Freq: Once | ORAL | Status: DC | PRN

## 2022-01-08 MED ORDER — PROPOFOL 10 MG/ML IV BOLUS
INTRAVENOUS | Status: AC
  Filled 2022-01-08: qty 20

## 2022-01-08 MED ORDER — ONDANSETRON HCL 4 MG/2ML IJ SOLN
INTRAMUSCULAR | Status: DC | PRN
Start: 2022-01-08 — End: 2022-01-08
  Administered 2022-01-08: 5 mg via INTRAVENOUS

## 2022-01-08 MED ORDER — MAGNESIUM CITRATE PO SOLN: 1.0000 | Freq: Once | ORAL | Status: DC | PRN

## 2022-01-08 MED ORDER — CEFAZOLIN IN SODIUM CHLORIDE 3-0.9 GM/100ML-% IV SOLN
3.0000 g | INTRAVENOUS | Status: AC
  Administered 2022-01-08: 3 g via INTRAVENOUS
  Filled 2022-01-08: qty 100

## 2022-01-08 MED ORDER — OXYCODONE HCL 5 MG/5ML PO SOLN: 5.0000 mg | Freq: Once | ORAL | Status: DC | PRN

## 2022-01-08 MED ORDER — DOCUSATE SODIUM 100 MG PO CAPS
100.0000 mg | ORAL_CAPSULE | Freq: Every day | ORAL | Status: DC
  Administered 2022-01-09 – 2022-01-13 (×5): 100 mg via ORAL
  Filled 2022-01-08 (×6): qty 1

## 2022-01-08 MED ORDER — MIDAZOLAM HCL 2 MG/2ML IJ SOLN
INTRAMUSCULAR | Status: DC | PRN
Start: 2022-01-08 — End: 2022-01-08
  Administered 2022-01-08: 2 mg via INTRAVENOUS

## 2022-01-08 MED ORDER — ONDANSETRON HCL 4 MG/2ML IJ SOLN: 4.0000 mg | Freq: Four times a day (QID) | INTRAMUSCULAR | Status: DC | PRN

## 2022-01-08 MED ORDER — ALUM & MAG HYDROXIDE-SIMETH 200-200-20 MG/5ML PO SUSP: 15.0000 mL | ORAL | Status: DC | PRN

## 2022-01-08 MED ORDER — ROCURONIUM BROMIDE 10 MG/ML (PF) SYRINGE
PREFILLED_SYRINGE | INTRAVENOUS | Status: DC | PRN
  Administered 2022-01-08: 50 mg via INTRAVENOUS

## 2022-01-08 MED ORDER — BISACODYL 5 MG PO TBEC: 5.0000 mg | DELAYED_RELEASE_TABLET | Freq: Every day | ORAL | Status: DC | PRN

## 2022-01-08 MED ORDER — SUGAMMADEX SODIUM 500 MG/5ML IV SOLN
INTRAVENOUS | Status: AC
  Filled 2022-01-08: qty 5

## 2022-01-08 MED ORDER — TRANEXAMIC ACID-NACL 1000-0.7 MG/100ML-% IV SOLN
INTRAVENOUS | Status: AC
  Filled 2022-01-08: qty 100

## 2022-01-08 MED ORDER — 0.9 % SODIUM CHLORIDE (POUR BTL) OPTIME
TOPICAL | Status: DC | PRN
  Administered 2022-01-08: 1000 mL

## 2022-01-08 MED ORDER — POVIDONE-IODINE 10 % EX SWAB: 2.0000 "application " | Freq: Once | CUTANEOUS | Status: DC

## 2022-01-08 MED ORDER — TRANEXAMIC ACID-NACL 1000-0.7 MG/100ML-% IV SOLN
1000.0000 mg | INTRAVENOUS | Status: AC
  Administered 2022-01-08: 1000 mg via INTRAVENOUS

## 2022-01-08 MED ORDER — MAGNESIUM SULFATE 2 GM/50ML IV SOLN: 2.0000 g | Freq: Every day | INTRAVENOUS | Status: DC | PRN

## 2022-01-08 MED ORDER — PROPOFOL 10 MG/ML IV BOLUS
INTRAVENOUS | Status: DC | PRN
Start: 2022-01-08 — End: 2022-01-08
  Administered 2022-01-08: 150 mg via INTRAVENOUS

## 2022-01-08 MED ORDER — ONDANSETRON HCL 4 MG/2ML IJ SOLN
INTRAMUSCULAR | Status: AC
  Filled 2022-01-08: qty 2

## 2022-01-08 MED ORDER — LABETALOL HCL 5 MG/ML IV SOLN: 10.0000 mg | INTRAVENOUS | Status: DC | PRN

## 2022-01-08 MED ORDER — MIDAZOLAM HCL 2 MG/2ML IJ SOLN
INTRAMUSCULAR | Status: AC
  Filled 2022-01-08: qty 2

## 2022-01-08 MED ORDER — INSULIN GLARGINE-YFGN 100 UNIT/ML ~~LOC~~ SOLN
40.0000 [IU] | Freq: Every day | SUBCUTANEOUS | Status: DC
  Administered 2022-01-09 – 2022-01-13 (×5): 40 [IU] via SUBCUTANEOUS
  Filled 2022-01-08 (×5): qty 0.4

## 2022-01-08 MED ORDER — CHLORHEXIDINE GLUCONATE 4 % EX LIQD
60.0000 mL | Freq: Once | CUTANEOUS | Status: AC
  Administered 2022-01-08: 4 via TOPICAL
  Filled 2022-01-08: qty 60

## 2022-01-08 MED ORDER — GUAIFENESIN-DM 100-10 MG/5ML PO SYRP: 15.0000 mL | ORAL_SOLUTION | ORAL | Status: DC | PRN

## 2022-01-08 MED ORDER — FENTANYL CITRATE (PF) 250 MCG/5ML IJ SOLN
INTRAMUSCULAR | Status: DC | PRN
Start: 2022-01-08 — End: 2022-01-08
  Administered 2022-01-08: 100 ug via INTRAVENOUS
  Administered 2022-01-08: 150 ug via INTRAVENOUS

## 2022-01-08 MED ORDER — LIDOCAINE 2% (20 MG/ML) 5 ML SYRINGE
INTRAMUSCULAR | Status: AC
  Filled 2022-01-08: qty 5

## 2022-01-08 MED ORDER — PHENYLEPHRINE 40 MCG/ML (10ML) SYRINGE FOR IV PUSH (FOR BLOOD PRESSURE SUPPORT)
PREFILLED_SYRINGE | INTRAVENOUS | Status: DC | PRN
  Administered 2022-01-08 (×5): 40 ug via INTRAVENOUS

## 2022-01-08 MED ORDER — LIDOCAINE 2% (20 MG/ML) 5 ML SYRINGE
INTRAMUSCULAR | Status: DC | PRN
Start: 2022-01-08 — End: 2022-01-08
  Administered 2022-01-08: 60 mg via INTRAVENOUS

## 2022-01-08 MED ORDER — DEXAMETHASONE SODIUM PHOSPHATE 10 MG/ML IJ SOLN
INTRAMUSCULAR | Status: DC | PRN
  Administered 2022-01-08: 5 mg via INTRAVENOUS

## 2022-01-08 MED ORDER — SUGAMMADEX SODIUM 200 MG/2ML IV SOLN
INTRAVENOUS | Status: DC | PRN
  Administered 2022-01-08: 500 mg via INTRAVENOUS

## 2022-01-08 MED ORDER — LACTATED RINGERS IV SOLN: INTRAVENOUS | Status: DC | PRN

## 2022-01-08 MED ORDER — PROMETHAZINE HCL 25 MG/ML IJ SOLN: 6.2500 mg | INTRAMUSCULAR | Status: DC | PRN

## 2022-01-08 MED ORDER — FENTANYL CITRATE (PF) 250 MCG/5ML IJ SOLN
INTRAMUSCULAR | Status: AC
  Filled 2022-01-08: qty 5

## 2022-01-08 MED ORDER — DEXAMETHASONE SODIUM PHOSPHATE 10 MG/ML IJ SOLN
INTRAMUSCULAR | Status: AC
  Filled 2022-01-08: qty 1

## 2022-01-08 MED ORDER — FENTANYL CITRATE (PF) 100 MCG/2ML IJ SOLN
25.0000 ug | INTRAMUSCULAR | Status: DC | PRN
  Administered 2022-01-08: 50 ug via INTRAVENOUS

## 2022-01-08 MED ORDER — VANCOMYCIN HCL 1500 MG/300ML IV SOLN
1500.0000 mg | Freq: Once | INTRAVENOUS | Status: AC
  Administered 2022-01-08: 1500 mg via INTRAVENOUS
  Filled 2022-01-08 (×2): qty 300

## 2022-01-08 MED ORDER — METOPROLOL TARTRATE 5 MG/5ML IV SOLN: 2.0000 mg | INTRAVENOUS | Status: DC | PRN

## 2022-01-08 MED ORDER — HYDROMORPHONE HCL 1 MG/ML IJ SOLN
0.5000 mg | INTRAMUSCULAR | Status: DC | PRN
  Administered 2022-01-08 – 2022-01-09 (×3): 1 mg via INTRAVENOUS
  Filled 2022-01-08 (×3): qty 1

## 2022-01-08 SURGICAL SUPPLY — 38 items
BAG COUNTER SPONGE SURGICOUNT (BAG) IMPLANT
BAG SPNG CNTER NS LX DISP (BAG)
BLADE SAW RECIP 87.9 MT (BLADE) ×3 IMPLANT
BLADE SURG 21 STRL SS (BLADE) ×3 IMPLANT
BNDG COHESIVE 6X5 TAN STRL LF (GAUZE/BANDAGES/DRESSINGS) IMPLANT
CANISTER WOUND CARE 500ML ATS (WOUND CARE) ×3 IMPLANT
COVER SURGICAL LIGHT HANDLE (MISCELLANEOUS) ×3 IMPLANT
CUFF TOURN SGL QUICK 34 (TOURNIQUET CUFF) ×3
CUFF TRNQT CYL 34X4.125X (TOURNIQUET CUFF) ×2 IMPLANT
DRAPE DERMATAC (DRAPES) ×2 IMPLANT
DRAPE INCISE IOBAN 66X45 STRL (DRAPES) ×3 IMPLANT
DRAPE U-SHAPE 47X51 STRL (DRAPES) ×3 IMPLANT
DRESSING PREVENA PLUS CUSTOM (GAUZE/BANDAGES/DRESSINGS) ×2 IMPLANT
DRSG PREVENA PLUS CUSTOM (GAUZE/BANDAGES/DRESSINGS) ×3
DURAPREP 26ML APPLICATOR (WOUND CARE) ×3 IMPLANT
ELECT REM PT RETURN 9FT ADLT (ELECTROSURGICAL) ×3
ELECTRODE REM PT RTRN 9FT ADLT (ELECTROSURGICAL) ×2 IMPLANT
GLOVE SURG ORTHO LTX SZ9 (GLOVE) ×3 IMPLANT
GLOVE SURG UNDER POLY LF SZ9 (GLOVE) ×3 IMPLANT
GOWN STRL REUS W/ TWL XL LVL3 (GOWN DISPOSABLE) ×4 IMPLANT
GOWN STRL REUS W/TWL XL LVL3 (GOWN DISPOSABLE) ×6
KIT BASIN OR (CUSTOM PROCEDURE TRAY) ×3 IMPLANT
KIT TURNOVER KIT B (KITS) ×3 IMPLANT
MANIFOLD NEPTUNE II (INSTRUMENTS) ×3 IMPLANT
NS IRRIG 1000ML POUR BTL (IV SOLUTION) ×3 IMPLANT
PACK ORTHO EXTREMITY (CUSTOM PROCEDURE TRAY) ×3 IMPLANT
PAD ARMBOARD 7.5X6 YLW CONV (MISCELLANEOUS) ×3 IMPLANT
PREVENA RESTOR ARTHOFORM 46X30 (CANNISTER) ×3 IMPLANT
SPONGE T-LAP 18X18 ~~LOC~~+RFID (SPONGE) IMPLANT
STAPLER VISISTAT 35W (STAPLE) IMPLANT
STOCKINETTE IMPERVIOUS LG (DRAPES) ×3 IMPLANT
SUT ETHILON 2 0 PSLX (SUTURE) IMPLANT
SUT SILK 2 0 (SUTURE) ×3
SUT SILK 2-0 18XBRD TIE 12 (SUTURE) ×2 IMPLANT
SUT VIC AB 1 CTX 27 (SUTURE) ×6 IMPLANT
TOWEL GREEN STERILE (TOWEL DISPOSABLE) ×3 IMPLANT
TUBE CONNECTING 12X1/4 (SUCTIONS) ×3 IMPLANT
YANKAUER SUCT BULB TIP NO VENT (SUCTIONS) ×3 IMPLANT

## 2022-01-08 NOTE — Progress Notes (Signed)
Family Medicine Teaching Service Daily Progress Note Intern Pager: [phone]  Patient name: Joseph [name] Medical record number: [id number] Date of birth: [birth year] Age: 51 y.o. Gender: male  Primary Care Provider: Marva Panda, NP Consultants: Ortho Code Status: Full  Pt Overview and Major Events to Date:  1/9-admitted  Assessment and Plan:  Joseph [name] is a 51 year old male presenting with weakness and fall.  PMH is significant for CAD s/p CABG x4, TAD, T2DM, HTN, HLD, PE, COPD, GERD and left toe amputation  Fall with concern for possible syncope  POD#1 of Left BKA Syncope was likely due to hypotension in the setting of septic foot injury.  He was recently seen by Ortho who performed left BKA yesterday. Patient report he's still in pain at the surgical sight.  On exam left lower extremity is dressed in surgical boot with no sign of drainage.  Patient to see PT/OT for evaluation on physical therapy needs.  Will appreciate their recommendations.  Patient is postop day 1 of BKA. And ortho recommend 24 hours of antibiotics. Morning lab shows mildly elevated WBC 11.2 and down trending Hgb 8.3 likely due to recent surgery. Will monitor cbc closely and monitor for fever. -Orthopedics following, appreciate recs -PT/OT eval and treat -Continue monitoring fever curve -Continue cardiac monitoring -Continue vancomycin, cefepime, Flagyl -Increased V Dilaudid to 2 mg every 4 hours as needed plan -Home meds of oxycodone 10 mg every 6 hours as needed   T2DM CBG overnight was 105 - 315. This morning was 184. Burgess Estelle he got 33u short acting and 30u of long acting. Semglee will be increased to 40 units daily staring today.  Patient will start dose today. -Increase Semglee to 40 units  -Continue to monitor CBG  AKI  patient's creatinine this morning was 1.37 and improved -Avoid nephro toxic agents -Continue monitoring lab, BMP  Hypokalemia Potassium this morning was 5.1.  Ordered  10mg  Lokelma - One time dose of Lokelma 10 mg  -Continue cardiac monitoring -Continue daily lab, BMP  Other chronic and stable conditions CAD s/p CABG   PAD GERD COPD Bipolar with depression disorder  FEN/GI: Modified diet PPx: Heparin Dispo:Home with home health  pending clinical improvement . Barriers include medical status.   Subjective:  Joseph [name] was in bed watching TV.  He is doing well but endorses significant left lower extremity pain at the site of the surgery.  Said his Dilaudid is not having any effect on his pain.  Objective: Temp:  [97.5 F (36.4 C)-98.6 F (37 C)] 97.5 F (36.4 C) (01/20 0844) Pulse Rate:  [72-77] 72 (01/20 0844) Resp:  [16-18] 16 (01/20 0844) BP: (110-137)/(80-88) 137/88 (01/20 0844) SpO2:  [97 %-98 %] 97 % (01/20 0844) Physical Exam: General:Awake, well appearing, NAD HEENT: Atraumatic, MMM, No sclera icterus CV: RRR, no murmurs, normal S1/S2 Pulm: CTAB, good WOB on RA, no crackles or wheezing Abd: Soft, no distension, no tenderness Skin: dry, warm Ext:LLE is dressed in surgical cast with no signs of drianage.   Laboratory: Recent Labs  Lab 01/07/22 0336 01/07/22 2022 01/08/22 0209  WBC 9.0 7.1 6.7  HGB 9.2* 10.1* 9.1*  HCT 30.3* 32.7* 29.7*  PLT 322 350 347   Recent Labs  Lab 01/06/22 0949 01/06/22 1035 01/07/22 0336 01/07/22 2022 01/08/22 0209  NA 137   < > 139 138 139  K 6.3*   < > 5.5* 5.0 4.8  CL 104   < > 108 105 107  CO2 25   < >  23 23 24   BUN 45*   < > 39* 31* 29*  CREATININE 2.22*   < > 1.98* 1.57* 1.45*  CALCIUM 8.6*   < > 8.2* 8.7* 8.7*  PROT 7.1  --   --  6.7  --   BILITOT 0.4  --   --  0.2*  --   ALKPHOS 88  --   --  73  --   ALT 13  --   --  17  --   AST 22  --   --  29  --   GLUCOSE 170*   < > 133* 156* 232*   < > = values in this interval not displayed.    Imaging/Diagnostic Tests: No new test  , MD 01/08/2022, 4:53 PM PGY-1, [facility] Family [facility] FPTS Intern pager:  [phone], text pages welcome

## 2022-01-08 NOTE — Plan of Care (Signed)
  Problem: Education: Goal: Knowledge of the prescribed therapeutic regimen will improve Outcome: Progressing   Problem: Activity: Goal: Ability to perform//tolerate increased activity and mobilize with assistive devices will improve Outcome: Progressing   Problem: Pain Management: Goal: Pain level will decrease with appropriate interventions Outcome: Progressing   Problem: Skin Integrity: Goal: Demonstration of wound healing without infection will improve Outcome: Progressing   

## 2022-01-08 NOTE — Progress Notes (Signed)
Pharmacy Antibiotic Note  Joseph [name] is a 51 y.o. male admitted on 01/06/2022 with  foot infection . Patient had received 2 weeks of outpatient doxycycline and had no improvement in infection. Pharmacy has been consulted for vancomycin and cefepime dosing.  Patient initially received Zosyn x 1 and 1g vancomycin, followed by vancomycin 1.5g x1 for a total 2.5g load. He then received vancomycin 1500 mg ~24h later.   Patient is currently in surgery for amputation. Serum creatinine has continued to improve and is now 1.45. WBC still within normal limits and patient has continued to be afebrile.   Due to improved serum creatinine today, we will plan to redose vancomycin tonight and reassess following surgery.   Scr used: 1.45 mg/dL Weight: 768.1 kg Vd coeff: 0.5 L/kg Est AUC: 438   Plan: Vancomycin 1.5g x1 this evening  Vancomycin IV intermittent dosing for variable renal function Continue cefepime 2g IV q12h Continue metronidazole per MD Monitor renal function and signs of clinical improvement Follow-up cultures Follow-up plan after amputation  Height: 6\' 5"  (195.6 cm) Weight: (!) 196.9 kg (434 lb) IBW/kg (Calculated) : 89.1  Temp (24hrs), Avg:98.1 F (36.7 C), Min:97.5 F (36.4 C), Max:98.6 F (37 C)  Recent Labs  Lab 01/06/22 0949 01/06/22 1201 01/06/22 1600 01/07/22 0336 01/07/22 2022 01/08/22 0209  WBC 12.3*  --   --  9.0 7.1 6.7  CREATININE 2.22*  --  2.09* 1.98* 1.57* 1.45*  LATICACIDVEN 3.0* 2.2*  --   --   --   --      Estimated Creatinine Clearance: 116.5 mL/min (A) (by C-G formula based on SCr of 1.45 mg/dL (H)).    Allergies  Allergen Reactions   Sulfa Antibiotics Other (See Comments)    Headaches     Antimicrobials this admission: 1/18 vancomycin >>  1/18 cefepime >>  1/18 metronidazole >> 1/18 zosyn x 1  Dose adjustments this admission: none  Microbiology results: 1/18 BCx: ngtd 1/18 UCx: 3000 colonies/ml enterococcus faecalis 1/20 MRSA  PCR: positive  Thank you for involving pharmacy in this patient's care.  2/20, PharmD PGY1 Acute Care Pharmacy Resident  Phone: [phone] 01/08/2022  5:07 PM  Please check [email or web address] for unit-specific pharmacy phone numbers.

## 2022-01-08 NOTE — Interval H&P Note (Signed)
History and Physical Interval Note:  01/08/2022 6:33 AM  Joseph [name]  has presented today for surgery, with the diagnosis of Osteomyelitis Left Foot.  The various methods of treatment have been discussed with the patient and family. After consideration of risks, benefits and other options for treatment, the patient has consented to  Procedure(s): LEFT BELOW KNEE AMPUTATION (Left) as a surgical intervention.  The patient's history has been reviewed, patient examined, no change in status, stable for surgery.  I have reviewed the patient's chart and labs.  Questions were answered to the patient's satisfaction.     [name]

## 2022-01-08 NOTE — Transfer of Care (Signed)
Immediate Anesthesia Transfer of Care Note  Patient: Joseph [name]  Procedure(s) Performed: LEFT BELOW KNEE AMPUTATION (Left: Knee) APPLICATION OF WOUND VAC  Patient Location: PACU  Anesthesia Type:General  Level of Consciousness: awake and patient cooperative  Airway & Oxygen Therapy: Patient Spontanous Breathing and Patient connected to face mask oxygen  Post-op Assessment: Report given to RN and Post -op Vital signs reviewed and stable  Post vital signs: Reviewed and stable  Last Vitals:  Vitals Value Taken Time  BP 138/87 01/08/22 1706  Temp    Pulse 79 01/08/22 1708  Resp 10 01/08/22 1708  SpO2 96 % 01/08/22 1708  Vitals shown include unvalidated device data.  Last Pain:  Vitals:   01/08/22 0945  TempSrc:   PainSc: 6       Patients Stated Pain Goal: 3 (01/08/22 0645)  Complications: No notable events documented.

## 2022-01-08 NOTE — Op Note (Signed)
° °  Date of Surgery: 01/08/2022  INDICATIONS: Mr. Kaucher is a 51 y.o.-year-old male who presents after attempted foot salvage intervention with a transmetatarsal amputation.  Patient has developed wound dehiscence ulceration down to bone with cellulitis.  Patient does not have further foot salvage intervention options.Marland Kitchen  PREOPERATIVE DIAGNOSIS: Wound dehiscence osteomyelitis cellulitis left transmetatarsal amputation.  POSTOPERATIVE DIAGNOSIS: Same.  PROCEDURE: Transtibial amputation Application of Prevena wound VAC  SURGEON: [name], M.D.  ANESTHESIA:  general  IV FLUIDS AND URINE: See anesthesia records.  ESTIMATED BLOOD LOSS: See anesthesia records.  COMPLICATIONS: None.  DESCRIPTION OF PROCEDURE: The patient was brought to the operating room after undergoing regional anesthetic. After adequate levels of anesthesia were obtained patient's lower extremity was prepped using DuraPrep draped into a sterile field. A timeout was called. The foot was draped out of the sterile field with impervious stockinette. A transverse incision was made 12 cm distal to the tibial tubercle. This curved proximally and a large posterior flap was created. The tibia was transected 1 cm proximal to the skin incision. The fibula was transected just proximal to the tibial incision. The tibia was beveled anteriorly. A large posterior flap was created. The sciatic nerve was pulled cut and allowed to retract. The vascular bundles were suture ligated with 2-0 silk. The deep and superficial fascial layers were closed using #1 Vicryl. The skin was closed using staples and 2-0 nylon. The wound was covered with a Prevena customizable and arthroform wound VAC.  The dressing was sealed with dermatac there was a good suction fit. A prosthetic shrinker and limb protector were applied. Patient was taken to the PACU in stable condition.   DISCHARGE PLANNING:  Antibiotic duration: 24 hours  Weightbearing: Nonweightbearing on the  operative extremity  Pain medication: Opioid pathway  Dressing care/ Wound VAC: Continue wound VAC for 1 week after discharge  Discharge to: Discharge planning based on therapy's recommendations for possible inpatient rehabilitation, outpatient rehabilitation, or discharge to home with therapy  Follow-up: In the office 1 week post operative.  Aldean Baker, MD [facility] 5:12 PM

## 2022-01-08 NOTE — Anesthesia Procedure Notes (Signed)
Procedure Name: Intubation Date/Time: 01/08/2022 4:15 PM Performed by: Lorie Phenix, CRNA Pre-anesthesia Checklist: Patient identified, Emergency Drugs available, Suction available and Patient being monitored Patient Re-evaluated:Patient Re-evaluated prior to induction Oxygen Delivery Method: Circle system utilized Preoxygenation: Pre-oxygenation with 100% oxygen Induction Type: IV induction Ventilation: Mask ventilation without difficulty Laryngoscope Size: Mac and 4 Grade View: Grade IV Tube type: Oral Tube size: 7.5 mm Number of attempts: 1 Airway Equipment and Method: Stylet Placement Confirmation: ETT inserted through vocal cords under direct vision, positive ETCO2 and breath sounds checked- equal and bilateral Secured at: 25 cm Tube secured with: Tape Dental Injury: Teeth and Oropharynx as per pre-operative assessment  Difficulty Due To: Difficulty was unanticipated Future Recommendations: Recommend- induction with short-acting agent, and alternative techniques readily available

## 2022-01-08 NOTE — Progress Notes (Signed)
FPTS Brief Progress Note  S: Patient doing well at this time.  Reports that he wants his scheduled pain medicine but he has no other concerns at this time.  Denies any COVID symptoms at this time.  Wound VAC currently in place.   O: BP 104/70 (BP Location: Left Arm)    Pulse 84    Temp 98 F (36.7 C)    Resp 19    Ht 6\' 5"  (1.956 m)    Wt (!) 196.9 kg    SpO2 99%    BMI 51.46 kg/m   General: Pleasant 51 year old male in no acute distress Cardiac: Regular rate and rhythm Respiratory: Breathing, speaking in full sentences  A/P: Left foot wound Patient was taken for BKA by orthopedic today.  Doing well since procedure.  Denies any symptoms of syncope. - Continue to monitor wound VAC output - Continue antibiotics per Ortho - PT/OT eval and treat tomorrow - Continue with day team plans regarding rest of patient's management  - Orders reviewed. Labs for AM ordered, which was adjusted as needed.    52, MD 01/08/2022, 10:27 PM PGY-3, [facility] Night Resident  Please page [phone] with questions.

## 2022-01-08 NOTE — Anesthesia Preprocedure Evaluation (Addendum)
Anesthesia Evaluation  Patient identified by MRN, date of birth, ID band Patient awake    Reviewed: Allergy & Precautions, NPO status , Patient's Chart, lab work & pertinent test results  History of Anesthesia Complications Negative for: history of anesthetic complications  Airway Mallampati: II  TM Distance: >3 FB Neck ROM: Full    Dental  (+) Dental Advisory Given, Poor Dentition, Missing   Pulmonary asthma , sleep apnea , COPD, former smoker, PE   Pulmonary exam normal        Cardiovascular hypertension, Pt. on medications and Pt. on home beta blockers + CAD, + Past MI, + Cardiac Stents, + CABG, + Peripheral Vascular Disease and + DVT  Normal cardiovascular exam+ Valvular Problems/Murmurs    '22 Cath - 1.Severe native coronary artery disease, as detailed below, including functional occlusions of the mid LAD and mid LCx with competitive flow from bypass grafts, 70% distal LMCA stenosis, and multifocal RCA disease of up to 70%. 2.Widely patent LIMA-LAD, SVG-D3, and SVG-OM2. 3.Patent SVG-RPDA with 30% proximal/mid graft stenosis. 4.Moderately elevated left heart filling pressure with significant respiratory variation (LVEDP 30 mmHg end-expiratory, 20-26mmHg mean).  '22 TTE - EF 55%. Mild concentric left ventricular hypertrophy. Grade I diastolic dysfunction (impaired relaxation).There is mild dilatation of the aortic root, measuring 42 mm. There is mild dilatation of the ascending aorta, measuring 43 mm.     Neuro/Psych PSYCHIATRIC DISORDERS Anxiety Depression Bipolar Disorder negative neurological ROS     GI/Hepatic Neg liver ROS, GERD  Medicated and Controlled,  Endo/Other  diabetes, Type 2, Insulin Dependent, Oral Hypoglycemic AgentsMorbid obesity K 5.4   Renal/GU CRFRenal disease     Musculoskeletal  (+) Arthritis ,   Abdominal (+) + obese,   Peds  Hematology  On plavix    Anesthesia Other Findings Covid+  1/18   Reproductive/Obstetrics                           Anesthesia Physical  Anesthesia Plan  ASA: 3  Anesthesia Plan: General   Post-op Pain Management: Ofirmev IV (intra-op)   Induction: Intravenous  PONV Risk Score and Plan: 1 and Treatment may vary due to age or medical condition, Midazolam, Ondansetron and Dexamethasone  Airway Management Planned: Oral ETT  Additional Equipment: None  Intra-op Plan:   Post-operative Plan: Extubation in OR  Informed Consent: I have reviewed the patients History and Physical, chart, labs and discussed the procedure including the risks, benefits and alternatives for the proposed anesthesia with the patient or authorized representative who has indicated his/her understanding and acceptance.     Dental advisory given  Plan Discussed with: CRNA, Anesthesiologist and Surgeon  Anesthesia Plan Comments:        Anesthesia Quick Evaluation

## 2022-01-08 NOTE — Progress Notes (Signed)
°  Progress Note   Date: 01/08/2022  Patient Name: Joseph [name]        MRN#: [id number]  Review the patients clinical findings supports the diagnosis of:   Morbid obesity

## 2022-01-09 LAB — CBC
HCT: 28 % — ABNORMAL LOW (ref 39.0–52.0)
Hemoglobin: 8.5 g/dL — ABNORMAL LOW (ref 13.0–17.0)
MCH: 25.1 pg — ABNORMAL LOW (ref 26.0–34.0)
MCHC: 30.4 g/dL (ref 30.0–36.0)
MCV: 82.6 fL (ref 80.0–100.0)
Platelets: 340 10*3/uL (ref 150–400)
RBC: 3.39 MIL/uL — ABNORMAL LOW (ref 4.22–5.81)
RDW: 16.6 % — ABNORMAL HIGH (ref 11.5–15.5)
WBC: 11.4 10*3/uL — ABNORMAL HIGH (ref 4.0–10.5)
nRBC: 0 % (ref 0.0–0.2)

## 2022-01-09 LAB — BASIC METABOLIC PANEL
Anion gap: 7 (ref 5–15)
BUN: 23 mg/dL — ABNORMAL HIGH (ref 6–20)
CO2: 24 mmol/L (ref 22–32)
Calcium: 8 mg/dL — ABNORMAL LOW (ref 8.9–10.3)
Chloride: 109 mmol/L (ref 98–111)
Creatinine, Ser: 1.37 mg/dL — ABNORMAL HIGH (ref 0.61–1.24)
GFR, Estimated: >60 mL/min (ref >60)
Glucose, Bld: 206 mg/dL — ABNORMAL HIGH (ref 70–99)
Potassium: 5.1 mmol/L (ref 3.5–5.1)
Sodium: 140 mmol/L (ref 135–145)

## 2022-01-09 LAB — GLUCOSE, CAPILLARY
Glucose-Capillary: 157 mg/dL — ABNORMAL HIGH (ref 70–99)
Glucose-Capillary: 163 mg/dL — ABNORMAL HIGH (ref 70–99)
Glucose-Capillary: 184 mg/dL — ABNORMAL HIGH (ref 70–99)
Glucose-Capillary: 195 mg/dL — ABNORMAL HIGH (ref 70–99)
Glucose-Capillary: 209 mg/dL — ABNORMAL HIGH (ref 70–99)
Glucose-Capillary: 290 mg/dL — ABNORMAL HIGH (ref 70–99)

## 2022-01-09 MED ORDER — HYDROMORPHONE HCL 1 MG/ML IJ SOLN
2.0000 mg | INTRAMUSCULAR | Status: DC | PRN
  Administered 2022-01-09 – 2022-01-10 (×5): 2 mg via INTRAVENOUS
  Filled 2022-01-09 (×5): qty 2

## 2022-01-09 MED ORDER — VANCOMYCIN HCL 1750 MG/350ML IV SOLN
1750.0000 mg | Freq: Two times a day (BID) | INTRAVENOUS | Status: AC
  Administered 2022-01-09: 1750 mg via INTRAVENOUS
  Filled 2022-01-09: qty 350

## 2022-01-09 MED ORDER — ACETAMINOPHEN 325 MG PO TABS
650.0000 mg | ORAL_TABLET | Freq: Four times a day (QID) | ORAL | Status: DC
  Administered 2022-01-09 – 2022-01-10 (×3): 650 mg via ORAL
  Filled 2022-01-09 (×3): qty 2

## 2022-01-09 MED ORDER — SODIUM ZIRCONIUM CYCLOSILICATE 10 G PO PACK
10.0000 g | PACK | Freq: Every day | ORAL | Status: DC
  Administered 2022-01-09: 10 g via ORAL
  Filled 2022-01-09: qty 1

## 2022-01-09 MED ORDER — INSULIN ASPART 100 UNIT/ML IJ SOLN
0.0000 [IU] | Freq: Three times a day (TID) | INTRAMUSCULAR | Status: DC
  Administered 2022-01-10: 7 [IU] via SUBCUTANEOUS
  Administered 2022-01-10 – 2022-01-12 (×5): 4 [IU] via SUBCUTANEOUS
  Administered 2022-01-12: 09:00:00 7 [IU] via SUBCUTANEOUS
  Administered 2022-01-12: 11:00:00 4 [IU] via SUBCUTANEOUS
  Administered 2022-01-13: 18:00:00 7 [IU] via SUBCUTANEOUS
  Administered 2022-01-13: 13:00:00 11 [IU] via SUBCUTANEOUS
  Administered 2022-01-13: 09:00:00 4 [IU] via SUBCUTANEOUS
  Administered 2022-01-14 (×2): 11 [IU] via SUBCUTANEOUS

## 2022-01-09 MED ORDER — ALPRAZOLAM 0.5 MG PO TABS
1.0000 mg | ORAL_TABLET | Freq: Two times a day (BID) | ORAL | Status: DC
  Administered 2022-01-09 – 2022-01-14 (×8): 1 mg via ORAL
  Filled 2022-01-09 (×9): qty 2

## 2022-01-09 NOTE — Progress Notes (Addendum)
Inpatient Rehab Admissions Coordinator:   Per therapy recommendations,  patient was screened for CIR candidacy by Megan Salon, MS, CCC-SLP. Pt.'s payor source typically does not approve CIR for amputations. Additionally Pt. Tested positive for COVID 01/06/22 and CIR is unable to accept Pt.'s until acute MD or Infectious Disease has removed isolation precautions.Pt. is already min assist with transfers, so may not continue to require intensive rehab once his isolation precautions are lifted.   I will not pursue CIR at this time. Please contact me any with questions.  Megan Salon, MS, CCC-SLP Rehab Admissions Coordinator  [phone] (celll) [phone] (office)

## 2022-01-09 NOTE — Progress Notes (Signed)
Patient ID: Joseph [name], male   DOB: Oct 20, 1971, 51 y.o.   MRN: [id number] Patient is postoperative day 1 left transtibial amputation.  There is no drainage in the wound VAC canister.  Anticipate patient can discharge to either inpatient versus outpatient rehab with the portable Praveena wound VAC pump at discharge.

## 2022-01-09 NOTE — Anesthesia Postprocedure Evaluation (Signed)
Anesthesia Post Note  Patient: Joseph [name]  Procedure(s) Performed: LEFT BELOW KNEE AMPUTATION (Left: Knee) APPLICATION OF WOUND VAC     Patient location during evaluation: PACU Anesthesia Type: General Level of consciousness: awake and alert Pain management: pain level controlled Vital Signs Assessment: post-procedure vital signs reviewed and stable Respiratory status: spontaneous breathing, nonlabored ventilation, respiratory function stable and patient connected to nasal cannula oxygen Cardiovascular status: blood pressure returned to baseline and stable Postop Assessment: no apparent nausea or vomiting Anesthetic complications: no   No notable events documented.  Last Vitals:  Vitals:   01/08/22 1957 01/09/22 0901  BP: 104/70 109/62  Pulse: 84 81  Resp: 19 17  Temp:  36.7 C  SpO2: 99% 98%                   [name]
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
## 2022-01-09 NOTE — Progress Notes (Signed)
FPTS Brief Progress Note  S: Patient seen at bedside for evening rounds and was sleeping comfortably. I did not wake him. Spoke with primary RN who does not have any concerns.  O: BP 117/86 (BP Location: Left Arm)    Pulse 76    Temp 98 F (36.7 C) (Oral)    Resp 18    Ht 6\' 5"  (1.956 m)    Wt (!) 162.8 kg    SpO2 95%    BMI 42.56 kg/m   Gen: sleeping comfortably Resp: normal work of breathing  A/P: - Plan per day team progress note. No changes to current plan - Orders reviewed. Labs for AM ordered, which was adjusted as needed.    , MD 01/09/2022, 10:57 PM PGY-2, [facility] Night Resident  Please page [phone] with questions.

## 2022-01-09 NOTE — Plan of Care (Signed)
°  Problem: Safety: Goal: Ability to remain free from injury will improve Outcome: Progressing   Problem: Pain Management: Goal: Pain level will decrease with appropriate interventions Outcome: Progressing   Problem: Skin Integrity: Goal: Demonstration of wound healing without infection will improve Outcome: Progressing   Problem: Clinical Measurements: Goal: Ability to maintain clinical measurements within normal limits will improve Outcome: Progressing Goal: Will remain free from infection Outcome: Progressing Goal: Diagnostic test results will improve Outcome: Progressing Goal: Respiratory complications will improve Outcome: Progressing Goal: Cardiovascular complication will be avoided Outcome: Progressing
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
## 2022-01-10 ENCOUNTER — Other Ambulatory Visit: Payer: Self-pay

## 2022-01-10 ENCOUNTER — Encounter (HOSPITAL_COMMUNITY): Payer: Self-pay | Admitting: Orthopedic Surgery

## 2022-01-10 DIAGNOSIS — S88112A Complete traumatic amputation at level between knee and ankle, left lower leg, initial encounter: Secondary | ICD-10-CM

## 2022-01-10 LAB — BASIC METABOLIC PANEL
Anion gap: 7 (ref 5–15)
BUN: 21 mg/dL — ABNORMAL HIGH (ref 6–20)
CO2: 23 mmol/L (ref 22–32)
Calcium: 8 mg/dL — ABNORMAL LOW (ref 8.9–10.3)
Chloride: 108 mmol/L (ref 98–111)
Creatinine, Ser: 1.18 mg/dL (ref 0.61–1.24)
GFR, Estimated: >60 mL/min (ref >60)
Glucose, Bld: 199 mg/dL — ABNORMAL HIGH (ref 70–99)
Potassium: 4.5 mmol/L (ref 3.5–5.1)
Sodium: 138 mmol/L (ref 135–145)

## 2022-01-10 LAB — GLUCOSE, CAPILLARY
Glucose-Capillary: 172 mg/dL — ABNORMAL HIGH (ref 70–99)
Glucose-Capillary: 204 mg/dL — ABNORMAL HIGH (ref 70–99)
Glucose-Capillary: 175 mg/dL — ABNORMAL HIGH (ref 70–99)
Glucose-Capillary: 116 mg/dL — ABNORMAL HIGH (ref 70–99)

## 2022-01-10 LAB — CBC
HCT: 26.8 % — ABNORMAL LOW (ref 39.0–52.0)
Hemoglobin: 8.1 g/dL — ABNORMAL LOW (ref 13.0–17.0)
MCH: 24.9 pg — ABNORMAL LOW (ref 26.0–34.0)
MCHC: 30.2 g/dL (ref 30.0–36.0)
MCV: 82.5 fL (ref 80.0–100.0)
Platelets: 276 10*3/uL (ref 150–400)
RBC: 3.25 MIL/uL — ABNORMAL LOW (ref 4.22–5.81)
RDW: 17.1 % — ABNORMAL HIGH (ref 11.5–15.5)
WBC: 8.1 10*3/uL (ref 4.0–10.5)
nRBC: 0 % (ref 0.0–0.2)

## 2022-01-10 MED ORDER — BACLOFEN 10 MG PO TABS
10.0000 mg | ORAL_TABLET | Freq: Once | ORAL | Status: AC
  Administered 2022-01-10: 10 mg via ORAL
  Filled 2022-01-10: qty 1

## 2022-01-10 MED ORDER — OXYCODONE HCL 5 MG PO TABS
15.0000 mg | ORAL_TABLET | Freq: Every day | ORAL | Status: DC
  Administered 2022-01-10 – 2022-01-14 (×21): 15 mg via ORAL
  Filled 2022-01-10 (×21): qty 3

## 2022-01-10 MED ORDER — HYDROCODONE-ACETAMINOPHEN 5-325 MG PO TABS
1.0000 | ORAL_TABLET | ORAL | Status: DC | PRN
  Administered 2022-01-10: 2 via ORAL
  Filled 2022-01-10: qty 2

## 2022-01-10 NOTE — Progress Notes (Signed)
Pt. Refused cpap. 

## 2022-01-10 NOTE — Progress Notes (Signed)
Family Medicine Teaching Service Daily Progress Note Intern Pager: [phone]  Patient name: Joseph [name] Medical record number: [id number] Date of birth: 1971-01-16 Age: 51 y.o. Gender: male  Primary Care Provider: Marva Panda, NP Consultants: Ortho Code Status: Full  Pt Overview and Major Events to Date:  1/19- Admitted 1/20- Left BKA  Assessment and Plan: Joseph [name] is a 51 year old male presenting with weakness and fall.  PMH is significant for CAD s/p CABG x4, TAD, T2DM, HTN, HLD, PE, COPD, GERD and left toe amputation  Fall with concern for possible syncope  POD#2 of Left BKA, s/p vanc/cefepime/flagyl Syncope was likely due to hypotension in the setting of septic foot injury. BP are soft but appropriate. Hgb 8.1 this morning. Wound vac in place, surgical site with clean padding and dressing. Will likely need inpatient v. SNF.  -Orthopedics following, appreciate recs -PT/OT eval and treat - Consult to CIR -Continue monitoring fever curve -AM CBC; Monitor hgb, transfusion threshold <8 with cardiac hx  -IV  Dilaudid to 2 mg discontinued -Discontinue home oxycodone 10 mg every 6 hours as needed -Start Norco 5-325 mg  1-2 tablet q4h PRN  -Continue tylenol 650 mg q6h  T2DM CBG 204 am. Burgess Estelle he got 19u short acting and 40u of long acting. -Consider increasing Semglee to 50 units  -Continue rssI -Continue to monitor CBg  AKI, resolved.  Cr 1.18 this morning -Continue monitoring lab, BMP  Hypokalemia, resolved Potassium this morning was 4.5.   -Continue cardiac monitoring -Continue daily lab, BMP  Other chronic and stable conditions CAD s/p CABG   PAD GERD COPD Bipolar with depression disorder   FEN/GI: Modified diet PPx: Heparin Dispo:CIR  pending eval for appropriateness .   Subjective:  Sitting up in bed. States he is doing well and practicing lift his left leg. 9 out of 10 pain. Has not had a bowel  Objective: Temp:  [98 F (36.7 C)-98.6 F  (37 C)] 98.6 F (37 C) (01/22 0653) Pulse Rate:  [75-84] 75 (01/22 0653) Resp:  [16-18] 16 (01/22 0653) BP: (108-117)/(62-86) 109/64 (01/22 0653) SpO2:  [95 %-98 %] 96 % (01/22 0653) Weight:  [162.8 kg] 162.8 kg (01/21 1422)  Physical Exam: General: Appears well, no acute distress. Age appropriate. Cardiac: RRR, normal heart sounds, no murmurs Respiratory: CTAB, normal effort Abdomen: soft, nontender, nondistended Extremities: No edema or cyanosis. Left BKA with clean dressing.  Skin: Warm and dry, no rashes noted Neuro: alert and oriented, no focal deficits Psych: normal affect   Laboratory: Recent Labs  Lab 01/08/22 0209 01/09/22 0912 01/10/22 0516  WBC 6.7 11.4* 8.1  HGB 9.1* 8.5* 8.1*  HCT 29.7* 28.0* 26.8*  PLT 347 340 276   Recent Labs  Lab 01/06/22 0949 01/06/22 1035 01/07/22 2022 01/08/22 0209 01/09/22 0912 01/10/22 0302  NA 137   < > 138 139 140 138  K 6.3*   < > 5.0 4.8 5.1 4.5  CL 104   < > 105 107 109 108  CO2 25   < > 23 24 24 23   BUN 45*   < > 31* 29* 23* 21*  CREATININE 2.22*   < > 1.57* 1.45* 1.37* 1.18  CALCIUM 8.6*   < > 8.7* 8.7* 8.0* 8.0*  PROT 7.1  --  6.7  --   --   --   BILITOT 0.4  --  0.2*  --   --   --   ALKPHOS 88  --  73  --   --   --  ALT 13  --  17  --   --   --   AST 22  --  29  --   --   --   GLUCOSE 170*   < > 156* 232* 206* 199*   < > = values in this interval not displayed.    Imaging/Diagnostic Tests: No new imaging.   Lavonda Jumbo, DO 01/10/2022, 8:47 AM PGY-3, [facility] FPTS Intern pager: [phone], text pages welcome

## 2022-01-10 NOTE — Plan of Care (Signed)
°  Problem: Education: Goal: Knowledge of the prescribed therapeutic regimen will improve Outcome: Progressing   Problem: Activity: Goal: Ability to perform//tolerate increased activity and mobilize with assistive devices will improve Outcome: Progressing   Problem: Clinical Measurements: Goal: Postoperative complications will be avoided or minimized Outcome: Progressing   Problem: Self-Care: Goal: Ability to meet self-care needs will improve Outcome: Progressing   Problem: Self-Concept: Goal: Ability to maintain and perform role responsibilities to the fullest extent possible will improve Outcome: Progressing   Problem: Pain Management: Goal: Pain level will decrease with appropriate interventions Outcome: Progressing

## 2022-01-11 LAB — BASIC METABOLIC PANEL
Anion gap: 10 (ref 5–15)
BUN: 26 mg/dL — ABNORMAL HIGH (ref 6–20)
CO2: 22 mmol/L (ref 22–32)
Calcium: 8.3 mg/dL — ABNORMAL LOW (ref 8.9–10.3)
Chloride: 104 mmol/L (ref 98–111)
Creatinine, Ser: 1.1 mg/dL (ref 0.61–1.24)
GFR, Estimated: >60 mL/min (ref >60)
Glucose, Bld: 205 mg/dL — ABNORMAL HIGH (ref 70–99)
Potassium: 4.7 mmol/L (ref 3.5–5.1)
Sodium: 136 mmol/L (ref 135–145)

## 2022-01-11 LAB — CBC
HCT: 29.2 % — ABNORMAL LOW (ref 39.0–52.0)
Hemoglobin: 8.6 g/dL — ABNORMAL LOW (ref 13.0–17.0)
MCH: 24.7 pg — ABNORMAL LOW (ref 26.0–34.0)
MCHC: 29.5 g/dL — ABNORMAL LOW (ref 30.0–36.0)
MCV: 83.9 fL (ref 80.0–100.0)
Platelets: 275 10*3/uL (ref 150–400)
RBC: 3.48 MIL/uL — ABNORMAL LOW (ref 4.22–5.81)
RDW: 17.3 % — ABNORMAL HIGH (ref 11.5–15.5)
WBC: 7.9 10*3/uL (ref 4.0–10.5)
nRBC: 0.3 % — ABNORMAL HIGH (ref 0.0–0.2)

## 2022-01-11 LAB — CULTURE, BLOOD (ROUTINE X 2)
Culture: NO GROWTH
Culture: NO GROWTH
Special Requests: ADEQUATE
Special Requests: ADEQUATE

## 2022-01-11 LAB — GLUCOSE, CAPILLARY
Glucose-Capillary: 190 mg/dL — ABNORMAL HIGH (ref 70–99)
Glucose-Capillary: 163 mg/dL — ABNORMAL HIGH (ref 70–99)
Glucose-Capillary: 170 mg/dL — ABNORMAL HIGH (ref 70–99)
Glucose-Capillary: 233 mg/dL — ABNORMAL HIGH (ref 70–99)

## 2022-01-11 MED ORDER — CHLORHEXIDINE GLUCONATE CLOTH 2 % EX PADS
6.0000 | MEDICATED_PAD | Freq: Every day | CUTANEOUS | Status: DC
  Administered 2022-01-11 – 2022-01-14 (×4): 6 via TOPICAL

## 2022-01-11 MED ORDER — MUPIROCIN 2 % EX OINT
1.0000 "application " | TOPICAL_OINTMENT | Freq: Two times a day (BID) | CUTANEOUS | Status: DC
  Administered 2022-01-11 – 2022-01-14 (×6): 1 via NASAL
  Filled 2022-01-11 (×2): qty 22

## 2022-01-11 MED ORDER — BACLOFEN 10 MG PO TABS
10.0000 mg | ORAL_TABLET | Freq: Once | ORAL | Status: AC
  Administered 2022-01-11: 10 mg via ORAL
  Filled 2022-01-11: qty 1

## 2022-01-11 NOTE — TOC Initial Note (Addendum)
Transition of Care [facility](TOC) - Initial/Assessment Note    Patient Details  Name: Joseph [name] MRN: [id number] Date of Birth: Feb 08, 1971  Transition of Care [facility](TOC) CM/SW Contact:    Lorri FrederickWierda, Tamyia Minich Jon, LCSW Phone Number: 01/11/2022, 1:35 PM  Clinical Narrative:  CSW informed pt is not candidate for CIR.  CSW spoke with pt by phone regarding SNF option.  Pt reports he was at SNF last month, had bad experience and does not wish to return. Pt currently lives with his wife, 49 year old son, and his aunt and uncle.  Pt reports his son is able to physically assist him, his uncle has COPD and is not.  [facility] but asked CSW to speak with his wife Joseph [name] as well, permission give to speak to her.  Pt is not vaccinated for covid.  CSW had RN deliver choice document anyways.    CSW spoke with wife Joseph [name], who confirms that pt had bad experience at [facility] in [place].  [facility] was only option due to pt weight. (Current 358)  Wife does have concerns about being able to [place] pt at home.  They have wheelchair but it does not fit through doorways in the home. Her son "is a big boy himeself" (nearly 300lbs, per Joseph [name]) but she is not sure he can provide all the assistance needed.  We discussed how much help HH would be.  Pt does have medicaid, [facility], but they have never pursued this.  Discussed that pt would have other SNF options in [place] hopefully as his weight appears to have declined.  [name] agreed to talk with pt and call CSW back.   1315: TC from wife Joseph [name].  She spoke with pt, they are agreeable to attempt to locate a SNF in [place].  They went over medicare.gov choice document and are interested in the following facilities: [facility], [facility], [facility], [facility], and [facility].  Referral sent out in hub.                  Expected Discharge Plan: Skilled Nursing Facility Barriers to Discharge: SNF Pending bed  offer   Patient Goals and CMS Choice Patient states their goals for this hospitalization and ongoing recovery are:: "get as close to normal as possible" CMS Medicare.gov Compare Post Acute Care list provided to:: Patient Choice offered to / list presented to : Patient  Expected Discharge Plan and Services Expected Discharge Plan: Skilled Nursing Facility     Post Acute Care Choice: Skilled Nursing Facility Living arrangements for the past 2 months: Single Family Home                                      Prior Living Arrangements/Services Living arrangements for the past 2 months: Single Family Home Lives with:: Spouse, Minor Children, Relatives (49 year old son and an older aunt and uncle) Patient language and need for interpreter reviewed:: Yes Do you feel safe going back to the place where you live?: Yes      Need for Family Participation in Patient Care: Yes (Comment) Care giver support system in place?: Yes (comment) Current home services: Other (comment) (none) Criminal Activity/Legal Involvement Pertinent to Current Situation/Hospitalization: No - Comment as needed  Activities of Daily Living Home Assistive Devices/Equipment: CBG Meter, Blood pressure cuff, Walker (specify type) ADL Screening (condition  at time of admission) Patient's cognitive ability adequate to safely complete daily activities?: No Is the patient deaf or have difficulty hearing?: No Does the patient have difficulty seeing, even when wearing glasses/contacts?: No Does the patient have difficulty concentrating, remembering, or making decisions?: Yes Patient able to express need for assistance with ADLs?: Yes Does the patient have difficulty dressing or bathing?: No Independently performs ADLs?: No Communication: Independent Dressing (OT): Needs assistance Is this a change from baseline?: Change from baseline, expected to last <3days Grooming: Needs assistance Is this a change from baseline?:  Change from baseline, expected to last <3 days Feeding: Independent Bathing: Needs assistance Is this a change from baseline?: Change from baseline, expected to last <3 days Toileting: Needs assistance Is this a change from baseline?: Change from baseline, expected to last <3 days In/Out Bed: Independent with device (comment) Walks in Home: Needs assistance Is this a change from baseline?: Change from baseline, expected to last >3 days Does the patient have difficulty walking or climbing stairs?: Yes Weakness of Legs: Left Weakness of Arms/Hands: None  Permission Sought/Granted Permission sought to share information with : Family Supports Permission granted to share information with : Yes, Verbal Permission Granted  Share Information with NAME: wife [name]           Emotional Assessment Appearance:: Appears stated age Attitude/Demeanor/Rapport: Engaged Affect (typically observed): Appropriate, Pleasant Orientation: : Oriented to Self, Oriented to Place, Oriented to  Time, Oriented to Situation Alcohol / Substance Use: Not Applicable Psych Involvement: Outpatient Provider  Admission diagnosis:  Osteomyelitis (HCC) [M86.9] Fall [W19.XXXA] Cellulitis of left lower extremity [L03.116] Sepsis, due to unspecified organism, unspecified whether acute organ dysfunction present [facility]) [A41.9] Patient Active Problem List   Diagnosis Date Noted   Below-knee amputation of left lower extremity (HCC)    Osteomyelitis (HCC) 01/06/2022   Bipolar disorder with depression (HCC)    Sepsis (HCC) 11/18/2021   Chest pain 11/18/2021   Dyspnea 11/18/2021   Wound infection    Ulcer of left foot with necrosis of bone (HCC)    Bicuspid aortic valve 09/14/2021   Aneurysm of ascending aorta 09/14/2021   Hyperkalemia 08/20/2021   DM (diabetes mellitus) with peripheral vascular complication (HCC) 08/20/2021   Atypical chest pain 07/17/2021   Hyponatremia 07/17/2021   Leg wound, left 07/17/2021    Essential hypertension 07/17/2021   Acute hyperglycemia 07/09/2021   Left leg cellulitis 04/11/2021   Cellulitis of left leg 03/05/2021   Cellulitis 02/18/2021   PAD (peripheral artery disease) (HCC) 10/10/2020   Osteomyelitis of fourth toe of right foot (HCC)    S/P CABG x 4 07/24/2020   Non-ST elevation (NSTEMI) myocardial infarction (HCC) 07/22/2020   Acute on chronic diastolic heart failure (HCC) 07/22/2020   Hypotension 07/18/2020   Left leg swelling 07/18/2020   Elevated troponin 07/18/2020   AKI (acute kidney injury) (HCC) 07/18/2020   Elevated brain natriuretic peptide (BNP) level 07/18/2020   GERD (gastroesophageal reflux disease) 07/18/2020   Unstable angina (HCC) 11/01/2019   Edema 06/13/2019   Leukocytosis 05/31/2019   Smoker 03/08/2019   Chronic pain 03/08/2019   Chronic back pain 02/16/2019   Deep venous thrombosis (HCC) 02/16/2019   Obesity, Class III, BMI 40-49.9 (morbid obesity) (HCC) 02/16/2019   Peripheral nerve disease 02/16/2019   COPD (chronic obstructive pulmonary disease) (HCC) 01/22/2019   Anxiety disorder 03/26/2018   Open wound of toe 10/12/2017   Sinusitis 10/12/2017   Abdominal pain    Loss of weight  Diabetic foot infection (HCC) 09/16/2017   Uncontrolled type 2 diabetes mellitus with hyperglycemia, with long-term current use of insulin (HCC) 09/16/2017   Cellulitis of right foot    Chronic ulcer of great toe of right foot (HCC) 09/09/2017   Recurrent chest pain 07/29/2016   Hyperglycemia    Insulin dependent type 2 diabetes mellitus (HCC) 04/29/2015   OSA (obstructive sleep apnea) 05/08/2013   History of pulmonary embolus (PE) 04/27/2013   CAD -S/P PCI 11/02/19  09/10/2008   Hyperlipidemia 09/09/2008   PCP:  Marva Panda, NP Pharmacy:   [facility] [phone] - [place], [place] - [address] AT [facility] OF [address] [place] [address] Phone: [phone] Fax:  [phone]  [id number] - [place], [place] - [address] AT SEC OF [address] [name] [address] Phone: [phone] Fax: [phone]  [facility] [address] [name] [address] Phone: [phone] Fax: [phone]     Social Determinants of Health (SDOH) Interventions    Readmission Risk Interventions Readmission Risk Prevention Plan 11/24/2021 08/20/2021 07/12/2021  Transportation Screening Complete Complete Complete  Medication Review (RN Care Manager) Complete Complete Complete  PCP or Specialist appointment within 3-5 days of discharge Complete Complete Complete  HRI or Home Care Consult Complete Complete Complete  SW Recovery Care/Counseling Consult Complete Complete Complete  Palliative Care Screening Not Applicable Not Applicable Not Applicable  Skilled Nursing Facility Complete Not Applicable Not Applicable  Some recent data might be hidden

## 2022-01-11 NOTE — Progress Notes (Signed)
Inpatient Rehab Admissions Coordinator:    CIR consult received. [employer] Medicare will not approve CIR for BKA. Additionally, CIR is unable to accept COVID+ patients until precautions are discontinued. I will not pursue admission for this Pt. Recommend TOC look for other rehab venues.   Clemens Catholic, [place], [place] Admissions Coordinator  [phone] ([place]) [phone] (office)

## 2022-01-11 NOTE — Progress Notes (Signed)
Family Medicine Teaching Service Daily Progress Note Intern Pager: [phone]  Patient name: Joseph [name] Medical record number: [id number] Date of birth: October 04, 1971 Age: 50 y.o. Gender: male  Primary Care Provider: Marva Panda, NP Consultants: Ortho Code Status: Full  Pt Overview and Major Events to Date:  1/19- Admitted 1/20- Left BKA  Assessment and Plan:  Joseph [name] is a 51 year old male presenting with weakness and fall.  PMH is significant for CAD s/p CABG x4, TAD, T2DM, HTN, HLD, PE, COPD, GERD and left toe amputation  Fall with concern for possible syncope  POD #3 of Left BKA, s/p vanc/cefepime/flagyl Patient had CIR eval today, and did not meat criteria. SW is looking for SNF. Hgb 8.6 today from 8.1 yesterday.  Wound vac in place, surgical site clean padding and dressing. Patient will need a wheelchair at d/c.  -PT/OT -Orhto following, appreciate recs -Transfusion threshold 8 2/2 cardiac history -Continue tylenol 650 mg q6h -Norco 5-325 mg, 1-2 tablet q4h prn -AM CBC  T2DM CBG ranges have been from 110-200's Patient received 11 units short acting, and 40 units long acting yesterday.    Other chronic and stable conditions CAD s/p CABG   PAD GERD COPD Bipolar with depression disorder     FEN/GI: Modified diet PPx: Heparin Dispo:CIR  Pending evaluation .   Subjective:  Patient complaining of significant 9/10 leg pain, but appears comfortable.   Objective: Temp:  [97.8 F (36.6 C)-98.1 F (36.7 C)] 97.8 F (36.6 C) (01/23 0542) Pulse Rate:  [75-83] 78 (01/23 0542) Resp:  [17-18] 17 (01/22 2013) BP: (133-139)/(84-91) 133/91 (01/23 0542) SpO2:  [95 %-100 %] 100 % (01/23 0542) Physical Exam: General: Well appearing, well nourished, NAD, white male Cardiovascular: RRR, NRMG Respiratory: CTABL, normal effort Abdomen: Soft, NTTP, non-distended Extremities: No edema or cyanosis, BKA on left with dressing and brace  Laboratory: Recent Labs  Lab  01/09/22 0912 01/10/22 0516 01/11/22 0516  WBC 11.4* 8.1 7.9  HGB 8.5* 8.1* 8.6*  HCT 28.0* 26.8* 29.2*  PLT 340 276 275   Recent Labs  Lab 01/06/22 0949 01/06/22 1035 01/07/22 2022 01/08/22 0209 01/09/22 0912 01/10/22 0302 01/11/22 0516  NA 137   < > 138   < > 140 138 136  K 6.3*   < > 5.0   < > 5.1 4.5 4.7  CL 104   < > 105   < > 109 108 104  CO2 25   < > 23   < > 24 23 22   BUN 45*   < > 31*   < > 23* 21* 26*  CREATININE 2.22*   < > 1.57*   < > 1.37* 1.18 1.10  CALCIUM 8.6*   < > 8.7*   < > 8.0* 8.0* 8.3*  PROT 7.1  --  6.7  --   --   --   --   BILITOT 0.4  --  0.2*  --   --   --   --   ALKPHOS 88  --  73  --   --   --   --   ALT 13  --  17  --   --   --   --   AST 22  --  29  --   --   --   --   GLUCOSE 170*   < > 156*   < > 206* 199* 205*   < > = values in this interval not displayed.  Imaging/Diagnostic Tests:   Bess Kinds, MD 01/11/2022, 7:34 AM PGY-1, [facility] Family [facility] FPTS Intern pager: [phone], text pages welcome

## 2022-01-11 NOTE — Progress Notes (Signed)
FPTS Brief Progress Note  S: Patient was awake when I arrived for evening rounds.  He was sitting comfortably in bed.  Still endorses some pain to his left lower extremity but reports it is much improved now he is on his home pain med regimen.  However he expressed disappointment that his phone is missing and he has looked everywhere in his room for it today.   O: BP 139/85 (BP Location: Right Arm)    Pulse 83    Temp 98.1 F (36.7 C)    Resp 17    Ht 6\' 5"  (1.956 m)    Wt (!) 162.8 kg    SpO2 95%    BMI 42.56 kg/m   General:Awake, well appearing, NAD CV: RRR, no murmurs, normal S1/S2 Pulm: CTAB, good WOB on RA, no crackles or wheezing Abd: Soft, no distension, no tenderness Ext: Dressing is dry and clean.  No drainage in wound VAC canister   A/P: -Continue plan per day team.  No changes to current plan - Orders reviewed. Labs for AM ordered, which was adjusted as needed.    Alen Bleacher, MD 01/11/2022, 12:00 AM PGY-1, [place] [facility] Night Resident  Please page [phone] with questions.
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
## 2022-01-11 NOTE — [place] FL2 (Signed)
[place] MEDICAID FL2 LEVEL OF CARE SCREENING TOOL     IDENTIFICATION  Patient Name: Joseph [name] Birthdate: 17-Aug-1971 Sex: male Admission Date (Current Location): 01/06/2022  [place]  [place] [id number] O Facility and Address:  [facility], [address], [place], [place] [address]      Provider Number: [id number]  Attending Physician Name and Address:  Doreene ElandEniola, Kehinde T, MD  Relative Name and Phone Number:  Feher,[name] Spouse [phone]    Current Level of Care: Hospital Recommended Level of Care: Skilled Nursing Facility Prior Approval Number:    Date Approved/Denied:   PASRR Number: [id number]  Discharge Plan: SNF    Current Diagnoses: Patient Active Problem List   Diagnosis Date Noted   Below-knee amputation of left lower extremity (HCC)    Osteomyelitis (HCC) 01/06/2022   Bipolar disorder with depression (HCC)    Sepsis (HCC) 11/18/2021   Chest pain 11/18/2021   Dyspnea 11/18/2021   Wound infection    Ulcer of left foot with necrosis of bone (HCC)    Bicuspid aortic valve 09/14/2021   Aneurysm of ascending aorta 09/14/2021   Hyperkalemia 08/20/2021   DM (diabetes mellitus) with peripheral vascular complication (HCC) 08/20/2021   Atypical chest pain 07/17/2021   Hyponatremia 07/17/2021   Leg wound, left 07/17/2021   Essential hypertension 07/17/2021   Acute hyperglycemia 07/09/2021   Left leg cellulitis 04/11/2021   Cellulitis of left leg 03/05/2021   Cellulitis 02/18/2021   PAD (peripheral artery disease) (HCC) 10/10/2020   Osteomyelitis of fourth toe of right foot (HCC)    S/P CABG x 4 07/24/2020   Non-ST elevation (NSTEMI) myocardial infarction (HCC) 07/22/2020   Acute on chronic diastolic heart failure (HCC) 07/22/2020   Hypotension 07/18/2020   Left leg swelling 07/18/2020   Elevated troponin 07/18/2020   AKI (acute kidney injury) (HCC) 07/18/2020   Elevated brain natriuretic peptide (BNP)  level 07/18/2020   GERD (gastroesophageal reflux disease) 07/18/2020   Unstable angina (HCC) 11/01/2019   Edema 06/13/2019   Leukocytosis 05/31/2019   Smoker 03/08/2019   Chronic pain 03/08/2019   Chronic back pain 02/16/2019   Deep venous thrombosis (HCC) 02/16/2019   Obesity, Class III, BMI 40-49.9 (morbid obesity) (HCC) 02/16/2019   Peripheral nerve disease 02/16/2019   COPD (chronic obstructive pulmonary disease) (HCC) 01/22/2019   Anxiety disorder 03/26/2018   Open wound of toe 10/12/2017   Sinusitis 10/12/2017   Abdominal pain    Loss of weight    Diabetic foot infection (HCC) 09/16/2017   Uncontrolled type 2 diabetes mellitus with hyperglycemia, with long-term current use of insulin (HCC) 09/16/2017   Cellulitis of right foot    Chronic ulcer of great toe of right foot (HCC) 09/09/2017   Recurrent chest pain 07/29/2016   Hyperglycemia    Insulin dependent type 2 diabetes mellitus (HCC) 04/29/2015   OSA (obstructive sleep apnea) 05/08/2013   History of pulmonary embolus (PE) 04/27/2013   CAD -S/P PCI 11/02/19  09/10/2008   Hyperlipidemia 09/09/2008    Orientation RESPIRATION BLADDER Height & Weight     Self, Time, Situation, Place  Normal Continent Weight: (!) 358 lb 14.5 oz (162.8 kg) Height:  6\' 5"  (195.6 cm)  BEHAVIORAL SYMPTOMS/MOOD NEUROLOGICAL BOWEL NUTRITION STATUS      Continent Diet (see discharge summary)  AMBULATORY STATUS COMMUNICATION OF NEEDS Skin   Total Care Verbally Surgical wounds  Personal Care Assistance Level of Assistance  Bathing, Feeding, Dressing Bathing Assistance: Limited assistance Feeding assistance: Independent Dressing Assistance: Limited assistance     Functional Limitations Info  Sight, Hearing, Speech Sight Info: Adequate Hearing Info: Adequate Speech Info: Adequate    SPECIAL CARE FACTORS FREQUENCY  PT (By licensed PT), OT (By licensed OT)     PT Frequency: 5x week OT Frequency: 5x week             Contractures Contractures Info: Not present    Additional Factors Info  Code Status, Allergies, Insulin Sliding Scale Code Status Info: full Allergies Info: sulfa antibiotics   Insulin Sliding Scale Info: Novolog, 0-20 units 3x day with meals.  See discharge summary.       Current Medications (01/11/2022):  This is the current hospital active medication list Current Facility-Administered Medications  Medication Dose Route Frequency Provider Last Rate Last Admin   ALPRAZolam Prudy Feeler) tablet 1 mg  1 mg Oral BID Jerre Simon, MD   1 mg at 01/10/22 2123   alum & mag hydroxide-simeth (MAALOX/MYLANTA) 200-200-20 MG/5ML suspension 15-30 mL  15-30 mL Oral Q2H PRN [name], MD       ascorbic acid (VITAMIN C) tablet 1,000 mg  1,000 mg Oral Daily [name], MD   1,000 mg at 01/11/22 1019   aspirin EC tablet 81 mg  81 mg Oral Daily Autry-Lott, Simone, DO   81 mg at 01/11/22 1019   atorvastatin (LIPITOR) tablet 80 mg  80 mg Oral q1800 Autry-Lott, Simone, DO   80 mg at 01/10/22 1818   baclofen (LIORESAL) tablet 10 mg  10 mg Oral Once Levin Erp, MD       bisacodyl (DULCOLAX) EC tablet 5 mg  5 mg Oral Daily PRN [name], MD       buPROPion [facility] SR) 12 hr tablet 150 mg  150 mg Oral BID Autry-Lott, Simone, DO   150 mg at 01/11/22 1019   Chlorhexidine Gluconate Cloth 2 % PADS 6 each  6 each Topical Q0600 Janit Pagan T, MD   6 each at 01/11/22 1204   clopidogrel (PLAVIX) tablet 75 mg  75 mg Oral Q breakfast Autry-Lott, Simone, DO   75 mg at 01/11/22 0834   docusate sodium (COLACE) capsule 100 mg  100 mg Oral Daily [name], MD   100 mg at 01/11/22 1019   guaiFENesin-dextromethorphan (ROBITUSSIN DM) 100-10 MG/5ML syrup 15 mL  15 mL Oral Q4H PRN [name], MD       heparin injection 5,000 Units  5,000 Units Subcutaneous Q8H Autry-Lott, Simone, DO   5,000 Units at 01/11/22 3382   hydrALAZINE (APRESOLINE) injection 5 mg  5 mg Intravenous Q20 Min PRN [name], MD       icosapent Ethyl (VASCEPA) 1 g capsule 2 g  2 g Oral BID Autry-Lott, Simone, DO   2 g at 01/11/22 1019   insulin aspart (novoLOG) injection 0-20 Units  0-20 Units Subcutaneous TID WC Maury Dus, MD   4 Units at 01/11/22 1203   insulin glargine-yfgn (SEMGLEE) injection 40 Units  40 Units Subcutaneous Daily [name], MD   40 Units at 01/11/22 1019   labetalol (NORMODYNE) injection 10 mg  10 mg Intravenous Q10 min PRN [name], MD       magnesium sulfate IVPB 2 g 50 mL  2 g Intravenous Daily PRN [name], MD       metoprolol  tartrate (LOPRESSOR) injection 2-5 mg  2-5 mg Intravenous Q2H PRN [name], MD       mupirocin ointment (BACTROBAN) 2 % 1 application  1 application Nasal BID Janit Pagan T, MD   1 application at 01/11/22 1203   ondansetron (ZOFRAN) injection 4 mg  4 mg Intravenous Q6H PRN [name], MD       oxyCODONE (Oxy IR/ROXICODONE) immediate release tablet 15 mg  15 mg Oral 5 X Daily Autry-Lott, Simone, DO   15 mg at 01/11/22 1019   pantoprazole (PROTONIX) EC tablet 80 mg  80 mg Oral Daily Autry-Lott, Simone, DO   80 mg at 01/11/22 1019   pregabalin (LYRICA) capsule 150 mg  150 mg Oral QHS Autry-Lott, Simone, DO   150 mg at 01/10/22 2123   pregabalin (LYRICA) capsule 300 mg  300 mg Oral q AM Autry-Lott, Simone, DO   300 mg at 01/11/22 1007     Discharge Medications: Please see discharge summary for a list of discharge medications.  Relevant Imaging Results:  Relevant Lab Results:   Additional Information SS#:[id number]. Pt is not vaccinated for covid.  Lorri Frederick, LCSW

## 2022-01-11 NOTE — Progress Notes (Signed)
Patient refused CPAP for the night  

## 2022-01-12 DIAGNOSIS — S88112A Complete traumatic amputation at level between knee and ankle, left lower leg, initial encounter: Secondary | ICD-10-CM | POA: Diagnosis not present

## 2022-01-12 DIAGNOSIS — M86272 Subacute osteomyelitis, left ankle and foot: Secondary | ICD-10-CM | POA: Diagnosis not present

## 2022-01-12 LAB — CBC
HCT: 29.6 % — ABNORMAL LOW (ref 39.0–52.0)
Hemoglobin: 8.9 g/dL — ABNORMAL LOW (ref 13.0–17.0)
MCH: 24.6 pg — ABNORMAL LOW (ref 26.0–34.0)
MCHC: 30.1 g/dL (ref 30.0–36.0)
MCV: 81.8 fL (ref 80.0–100.0)
Platelets: 294 10*3/uL (ref 150–400)
RBC: 3.62 MIL/uL — ABNORMAL LOW (ref 4.22–5.81)
RDW: 17.6 % — ABNORMAL HIGH (ref 11.5–15.5)
WBC: 8.6 10*3/uL (ref 4.0–10.5)
nRBC: 0 % (ref 0.0–0.2)

## 2022-01-12 LAB — GLUCOSE, CAPILLARY
Glucose-Capillary: 240 mg/dL — ABNORMAL HIGH (ref 70–99)
Glucose-Capillary: 184 mg/dL — ABNORMAL HIGH (ref 70–99)
Glucose-Capillary: 168 mg/dL — ABNORMAL HIGH (ref 70–99)
Glucose-Capillary: 163 mg/dL — ABNORMAL HIGH (ref 70–99)
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
## 2022-01-12 NOTE — Progress Notes (Signed)
FPTS Brief Progress Note  S: Went to see patient for night rounds.  Patient was laying in bed asleep.  Did not wake patient up.  Vitals are stable.   O: BP 99/80    Pulse 78    Temp 98.5 F (36.9 C) (Oral)    Resp 14    Ht 6\' 5"  (1.956 m)    Wt (!) 162.8 kg    SpO2 98%    BMI 42.56 kg/m   General: Asleep, NAD Pulm: Normal work of breathing on room air.  A/P: -Continue plan per day team  - Orders reviewed. Labs for AM ordered, which was adjusted as needed.   , MD 01/12/2022, 2:15 AM PGY-1, [facility] Night Resident  Please page [phone] with questions.

## 2022-01-12 NOTE — Progress Notes (Signed)
FPTS Brief Note Reviewed patient's vitals, recent notes.  Vitals:   01/12/22 1440 01/12/22 1709  BP: 138/86 118/86  Pulse: 84 77  Resp: 17   Temp: 98.2 F (36.8 C)   SpO2: 98%    At this time, no change in plan from day progress note.  Alen Bleacher, MD Page [phone] with questions about this patient.

## 2022-01-12 NOTE — Care Management Important Message (Signed)
Important Message  Patient Details  Name: Joseph [name] MRN: [id number] Date of Birth: 1971/08/22   Medicare Important Message Given:  Yes     Sherilyn Banker 01/12/2022, 12:55 PM

## 2022-01-12 NOTE — TOC Progression Note (Addendum)
Transition of Care [facility]) - Progression Note    Patient Details  Name: Joseph [name] MRN: [id number] Date of Birth: 11-19-1971  Transition of Care [facility]) CM/SW Contact  Lorri Frederick, LCSW Phone Number: 01/12/2022, 1:23 PM  Clinical Narrative:   CSW reached out to SNFs requested by pt: [facility]: cannot offer bed, weight limit 300 [facility]: can offer bed, after covid quarantine. [facility]: can offer bed, after covid quarantine [facility]: cannot offer bed.  [facility] also responded in hub.  Bed offers presented to pt and he will discuss with wife.  1530: CSW received call from pt wife [name].  Pt had positive home covid test on 1/6, she has a picture of the test.  Pt PCP put him on oral medication at that time.  She is hoping he could be out of quarantine based on that.  They would like to choose [facility] for the facility.    MD notified and said pt can be removed from covid isolation here.   CSW asked [facility] if this allows pt to come to SNF at this time.    Expected Discharge Plan: Skilled Nursing Facility Barriers to Discharge: SNF Pending bed offer  Expected Discharge Plan and Services Expected Discharge Plan: Skilled Nursing Facility     Post Acute Care Choice: Skilled Nursing Facility Living arrangements for the past 2 months: Single Family Home                                       Social Determinants of Health (SDOH) Interventions    Readmission Risk Interventions Readmission Risk Prevention Plan 11/24/2021 08/20/2021 07/12/2021  Transportation Screening Complete Complete Complete  Medication Review [facility]) Complete Complete Complete  PCP or Specialist appointment within 3-5 days of discharge Complete Complete Complete  HRI or Home Care Consult Complete Complete Complete  SW Recovery Care/Counseling Consult Complete Complete Complete  Palliative Care Screening Not Applicable Not Applicable Not Applicable  Skilled Nursing  Facility Complete Not Applicable Not Applicable  Some recent data might be hidden

## 2022-01-12 NOTE — Progress Notes (Signed)
Pt refusing CPAP for the night.  

## 2022-01-12 NOTE — Plan of Care (Signed)

## 2022-01-12 NOTE — Progress Notes (Signed)
Family Medicine Teaching Service Daily Progress Note Intern Pager: [phone]  Patient name: Joseph [name] Medical record number: [id number] Date of birth: 20-Mar-1971 Age: 51 y.o. Gender: male  Primary Care Provider: Marva Panda, NP Consultants: Ortho Code Status: Full  Pt Overview and Major Events to Date:  1/19- Admitted 1/20- Left BKA  Assessment and Plan:  [name] is a 51 year old male presenting with weakness and fall.  PMH is significant for CAD s/p CABG x4, TAD, T2DM, HTN, HLD, PE, COPD, GERD and left toe amputation.   Fall with concern for possible syncope  POD #4 of Left BKA, s/p vanc/cefepime/flagyl Syncope thought to be 2/2 infection. Pain rated as high today. Patient also complaining of pain that's mild, that extends up to his hip, warm in nature. Will continue to monitor. OT recommending tub/shower bench at d/c, along with wheelchair. Patient stable and awaiting SNF placement.  -PT/OT -Ortho following, appreciate recs -Transfusion threshold 8 -Continue tylenol 650 mg q6h -Oxycodone 15 mg 5x a day  DM2 CBG ranges from 163-233. Patient received 12 units of short acting insulin and 40 units of long acting. -SSI -Semglee 40 units daily  All other conditions chronic and stable CAD s/p CABG   PAD GERD COPD Bipolar with depression disorder  FEN/GI: Carb Modified diet PPx: Heparin Dispo:SNF in 2-3 days.   Subjective:  Patient in good spirits today, complaining of some leg pain that extends up towards his hip.   Objective: Temp:  [97.6 F (36.4 C)-98.5 F (36.9 C)] 97.6 F (36.4 C) (01/24 6599) Pulse Rate:  [78-83] 79 (01/24 0633) Resp:  [14-18] 14 (01/24 3570) BP: (99-136)/(74-91) 136/91 (01/24 0633) SpO2:  [96 %-100 %] 97 % (01/24 1779) Physical Exam: General: Well appearing, conversational, good mood, NAD, white male Cardiovascular: RRR, nrmg Respiratory: CTABL Abdomen: Soft, NTTP, non-distended Extremities: Moving all extremities  independently, no edema  Laboratory: Recent Labs  Lab 01/10/22 0516 01/11/22 0516 01/12/22 0332  WBC 8.1 7.9 8.6  HGB 8.1* 8.6* 8.9*  HCT 26.8* 29.2* 29.6*  PLT 276 275 294   Recent Labs  Lab 01/06/22 0949 01/06/22 1035 01/07/22 2022 01/08/22 0209 01/09/22 0912 01/10/22 0302 01/11/22 0516  NA 137   < > 138   < > 140 138 136  K 6.3*   < > 5.0   < > 5.1 4.5 4.7  CL 104   < > 105   < > 109 108 104  CO2 25   < > 23   < > 24 23 22   BUN 45*   < > 31*   < > 23* 21* 26*  CREATININE 2.22*   < > 1.57*   < > 1.37* 1.18 1.10  CALCIUM 8.6*   < > 8.7*   < > 8.0* 8.0* 8.3*  PROT 7.1  --  6.7  --   --   --   --   BILITOT 0.4  --  0.2*  --   --   --   --   ALKPHOS 88  --  73  --   --   --   --   ALT 13  --  17  --   --   --   --   AST 22  --  29  --   --   --   --   GLUCOSE 170*   < > 156*   < > 206* 199* 205*   < > = values in this interval not displayed.  Imaging/Diagnostic Tests:   Bess Kinds, MD 01/12/2022, 6:40 AM PGY-1, [facility] Family Medicine FPTS Intern pager: [phone], text pages welcome

## 2022-01-13 DIAGNOSIS — L97511 Non-pressure chronic ulcer of other part of right foot limited to breakdown of skin: Secondary | ICD-10-CM

## 2022-01-13 DIAGNOSIS — M86272 Subacute osteomyelitis, left ankle and foot: Secondary | ICD-10-CM | POA: Diagnosis not present

## 2022-01-13 DIAGNOSIS — S88112A Complete traumatic amputation at level between knee and ankle, left lower leg, initial encounter: Secondary | ICD-10-CM | POA: Diagnosis not present

## 2022-01-13 LAB — GLUCOSE, CAPILLARY
Glucose-Capillary: 185 mg/dL — ABNORMAL HIGH (ref 70–99)
Glucose-Capillary: 266 mg/dL — ABNORMAL HIGH (ref 70–99)
Glucose-Capillary: 224 mg/dL — ABNORMAL HIGH (ref 70–99)
Glucose-Capillary: 277 mg/dL — ABNORMAL HIGH (ref 70–99)

## 2022-01-13 LAB — SURGICAL PATHOLOGY

## 2022-01-13 MED ORDER — INSULIN GLARGINE-YFGN 100 UNIT/ML ~~LOC~~ SOLN
45.0000 [IU] | Freq: Every day | SUBCUTANEOUS | Status: DC
  Administered 2022-01-14: 45 [IU] via SUBCUTANEOUS
  Filled 2022-01-13: qty 0.45

## 2022-01-13 MED ORDER — ENOXAPARIN SODIUM 80 MG/0.8ML IJ SOSY
80.0000 mg | PREFILLED_SYRINGE | INTRAMUSCULAR | Status: DC
  Administered 2022-01-13 – 2022-01-14 (×2): 80 mg via SUBCUTANEOUS
  Filled 2022-01-13 (×2): qty 0.8

## 2022-01-13 NOTE — TOC Progression Note (Addendum)
Transition of Care [facility]) - Progression Note    Patient Details  Name: HURSCHEL PAYNTER MRN: [id number] Date of Birth: [birth year]  [facility] CM/SW Contact  Lorri Frederick, LCSW Phone Number: 01/13/2022, 10:09 AM  Clinical Narrative:   CSW spoke with Reyne Dumas of [facility], update her on the positive home test and treatment through PCP from 1/6.  She needs documentation of this from PCP and could potentially take pt at that point.  CSW spoke with pt wife Herbert Seta and updated her, she will call PCP to get documentation.  1530: Wife was able to get printout of paxlovid script from PCP [facility] dated 12/27/21.  CSW forwarded it to [place] at [facility] who will discuss with her administration.  CSW spoke with [employer] and they do manage this policy for SNF auth.  SNF auth request submitted in [place].      Expected Discharge Plan: Skilled Nursing Facility Barriers to Discharge: SNF Pending bed offer  Expected Discharge Plan and Services Expected Discharge Plan: Skilled Nursing Facility     Post Acute Care Choice: Skilled Nursing Facility Living arrangements for the past 2 months: Single Family Home                                       Social Determinants of Health (SDOH) Interventions    Readmission Risk Interventions Readmission Risk Prevention Plan 11/24/2021 08/20/2021 07/12/2021  Transportation Screening Complete Complete Complete  Medication Review Oceanographer) Complete Complete Complete  PCP or Specialist appointment within 3-5 days of discharge Complete Complete Complete  HRI or Home Care Consult Complete Complete Complete  SW Recovery Care/Counseling Consult Complete Complete Complete  Palliative Care Screening Not Applicable Not Applicable Not Applicable  Skilled Nursing Facility Complete Not Applicable Not Applicable  Some recent data might be hidden
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
## 2022-01-13 NOTE — Consult Note (Addendum)
[place] Nurse Consult Note: Patient receiving care in [place] completed remotely after review of chart, images and [place] with bedside RN Danielle Reason for Consult: foot wound Wound type: Dried wound on the anterior side of the ankle of the RLE Pressure Injury POA: NA Wound bed: Pink, dry with a dark spot in the middle Drainage (amount, consistency, odor) None Periwound: Intact Dressing procedure/placement/frequency: Clean the RLE wound on the anterior side of the foot with soap and water, pat dry and apply Sween moisturizing lotion (pink top in clean supply) over the area. This may be repeated twice daily.   Monitor the wound area(s) for worsening of condition such as: Signs/symptoms of infection, increase in size, development of or worsening of odor, development of pain, or increased pain at the affected locations.   Notify the medical team if any of these develop.  Thank you for the consult. [place] nurse will not follow at this time.   Please re-consult the [place] team if needed.  Cathlean Marseilles Tamala Julian, MSN, RN, [place], [place], [facility] Wound Treatment Associate Pager [phone]
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
## 2022-01-13 NOTE — Progress Notes (Signed)
Family Medicine Teaching Service Daily Progress Note Intern Pager: [phone]  Patient name: Joseph [name] Iwata Medical record number: [id number] Date of birth: [birth year] Age: 51 y.o. Gender: male  Primary Care Provider: Marva Panda, NP Consultants: Ortho Code Status: Full  Pt Overview and Major Events to Date:  1/19- Admitted 1/20- Left BKA  Assessment and Plan:  Raeshawn Vo is a 51 year old male presenting with weakness and fall.  PMH is significant for CAD s/p CABG x4, TAD, T2DM, HTN, HLD, PE, COPD, GERD and left toe amputation.   Fall with concern for possible syncope  POD #5 of Left BKA, s/p vanc/cefepime/flagyl Patient feeling well today, pain still notable but well managed. Patient continues to have hip pain associated with new BKA on left, but says it's like his normal hip pin, but more notable. Will continue to monitor. Patient working with PT. Patient stable and awaiting SNF placement -PT/OT -Ortho following, appreciate recs -Continue Tylenol 650 mg q6h -Continue Oxycodone 15 mg x 5 a day  Concern for wound on right foot Patient with concern for new developing wound on right foot. Wound has been there for a while and appears stable from previous days. Patient concerned about wound and risk of 2 limb amputation from it. Wound care consulted and recommended washing with soap and water, and applying moisturizing lotion. Will check ABI on patient. -Check ABI on RLE -Wound care BID prn  -Continue to monitor  DM2 CBG ranges from 163-240, patient received 40 units glargine, and 15 units of short acting.  -Glargine increased to 45 units daily -Continue to monitor   All other conditions chronic and stable CAD s/p CABG   PAD GERD COPD Bipolar with depression disorder   FEN/GI: Carb modified PPx: Lovenox Dispo:SNF in 2-3 days.  Subjective:  No compaints overnight. Patient stable today   Objective: Temp:  [97.6 F (36.4 C)-98.2 F (36.8 C)] 97.6 F (36.4 C)  (01/24 2201) Pulse Rate:  [77-86] 86 (01/25 0618) Resp:  [17-19] 19 (01/24 2201) BP: (92-142)/(60-87) 92/60 (01/25 0618) SpO2:  [95 %-98 %] 97 % (01/25 0618) Physical Exam: General: Well appearing, conversational, good mood, NAD, white male Cardiovascular: RRR, nrmg Respiratory: CTABL Abdomen: Soft, NTTP, non-distended Extremities: Moving all extremities independently, no edema, early wound/dry skin on RLE with pink base, dried skin, and central dark spot  Laboratory: Recent Labs  Lab 01/10/22 0516 01/11/22 0516 01/12/22 0332  WBC 8.1 7.9 8.6  HGB 8.1* 8.6* 8.9*  HCT 26.8* 29.2* 29.6*  PLT 276 275 294   Recent Labs  Lab 01/06/22 0949 01/06/22 1035 01/07/22 2022 01/08/22 0209 01/09/22 0912 01/10/22 0302 01/11/22 0516  NA 137   < > 138   < > 140 138 136  K 6.3*   < > 5.0   < > 5.1 4.5 4.7  CL 104   < > 105   < > 109 108 104  CO2 25   < > 23   < > 24 23 22   BUN 45*   < > 31*   < > 23* 21* 26*  CREATININE 2.22*   < > 1.57*   < > 1.37* 1.18 1.10  CALCIUM 8.6*   < > 8.7*   < > 8.0* 8.0* 8.3*  PROT 7.1  --  6.7  --   --   --   --   BILITOT 0.4  --  0.2*  --   --   --   --   ALKPHOS 88  --  73  --   --   --   --   ALT 13  --  17  --   --   --   --   AST 22  --  29  --   --   --   --   GLUCOSE 170*   < > 156*   < > 206* 199* 205*   < > = values in this interval not displayed.      Imaging/Diagnostic Tests:   Bess Kinds, MD 01/13/2022, 7:38 AM PGY-1, [facility] Family Medicine FPTS Intern pager: [phone], text pages welcome

## 2022-01-13 NOTE — Plan of Care (Signed)
Redness on pt's right leg has not spread since last assessed. Pt is, however, concerned about the closed "wound" that is present at the site. Pt does not experience any pain in this area at that time.   Problem: Education: Goal: Knowledge of the prescribed therapeutic regimen will improve Outcome: Progressing Goal: Ability to verbalize activity precautions or restrictions will improve Outcome: Progressing Goal: Understanding of discharge needs will improve Outcome: Progressing   Problem: Activity: Goal: Ability to perform//tolerate increased activity and mobilize with assistive devices will improve Outcome: Progressing   Problem: Clinical Measurements: Goal: Postoperative complications will be avoided or minimized Outcome: Progressing   Problem: Self-Care: Goal: Ability to meet self-care needs will improve Outcome: Progressing   Problem: Self-Concept: Goal: Ability to maintain and perform role responsibilities to the fullest extent possible will improve Outcome: Progressing   Problem: Pain Management: Goal: Pain level will decrease with appropriate interventions Outcome: Progressing   Problem: Skin Integrity: Goal: Demonstration of wound healing without infection will improve Outcome: Progressing   Problem: Education: Goal: Knowledge of General Education information will improve Description: Including pain rating scale, medication(s)/side effects and non-pharmacologic comfort measures Outcome: Progressing   Problem: Health Behavior/Discharge Planning: Goal: Ability to manage health-related needs will improve Outcome: Progressing   Problem: Clinical Measurements: Goal: Ability to maintain clinical measurements within normal limits will improve Outcome: Progressing Goal: Will remain free from infection Outcome: Progressing Goal: Diagnostic test results will improve Outcome: Progressing Goal: Respiratory complications will improve Outcome: Progressing Goal:  Cardiovascular complication will be avoided Outcome: Progressing   Problem: Activity: Goal: Risk for activity intolerance will decrease Outcome: Progressing   Problem: Nutrition: Goal: Adequate nutrition will be maintained Outcome: Progressing   Problem: Coping: Goal: Level of anxiety will decrease Outcome: Progressing   Problem: Elimination: Goal: Will not experience complications related to bowel motility Outcome: Progressing Goal: Will not experience complications related to urinary retention Outcome: Progressing   Problem: Pain Managment: Goal: General experience of comfort will improve Outcome: Progressing   Problem: Safety: Goal: Ability to remain free from injury will improve Outcome: Progressing   Problem: Skin Integrity: Goal: Risk for impaired skin integrity will decrease Outcome: Progressing

## 2022-01-13 NOTE — Progress Notes (Signed)
Pt refused CPAP for the night. Pt seemed to not be in any distress and vitals are stable. RT will continue to monitor as needed.

## 2022-01-14 ENCOUNTER — Inpatient Hospital Stay (HOSPITAL_COMMUNITY): Payer: Medicare HMO

## 2022-01-14 DIAGNOSIS — I739 Peripheral vascular disease, unspecified: Secondary | ICD-10-CM

## 2022-01-14 LAB — GLUCOSE, CAPILLARY
Glucose-Capillary: 251 mg/dL — ABNORMAL HIGH (ref 70–99)
Glucose-Capillary: 269 mg/dL — ABNORMAL HIGH (ref 70–99)
Glucose-Capillary: 204 mg/dL — ABNORMAL HIGH (ref 70–99)

## 2022-01-14 MED ORDER — ALPRAZOLAM 1 MG PO TABS
1.0000 mg | ORAL_TABLET | Freq: Two times a day (BID) | ORAL | 0 refills | Status: AC | PRN
Start: 2022-01-14 — End: 2022-02-13

## 2022-01-14 MED ORDER — INSULIN GLARGINE 100 UNIT/ML ~~LOC~~ SOLN: 45.0000 [IU] | Freq: Every day | SUBCUTANEOUS | 11 refills | Status: DC

## 2022-01-14 MED ORDER — OXYCODONE HCL 15 MG PO TABS: 15.0000 mg | ORAL_TABLET | Freq: Every day | ORAL | 0 refills | Status: AC

## 2022-01-14 NOTE — Progress Notes (Signed)
ABI study completed.  ° °Please see CV Proc for preliminary results.  ° °Amberly Livas, RDMS, RVT ° °

## 2022-01-14 NOTE — TOC Transition Note (Signed)
Transition of Care [facility]) - CM/SW Discharge Note   Patient Details  Name: Joseph [name] MRN: [id number] Date of Birth: 1971/06/17  Transition of Care [facility]) CM/SW Contact:  Lorri Frederick, LCSW Phone Number: 01/14/2022, 2:57 PM   Clinical Narrative:   Pt discharging to [facility], room 3234.  RN call [phone] for report.      Final next level of care: Skilled Nursing Facility Barriers to Discharge: Barriers Resolved   Patient Goals and CMS Choice Patient states their goals for this hospitalization and ongoing recovery are:: "get as close to normal as possible" CMS Medicare.gov Compare Post Acute Care list provided to:: Patient Choice offered to / list presented to : Patient  Discharge Placement              Patient chooses bed at: [facility] Patient to be transferred to facility by: [facility] Name of family member notified: wife Herbert Seta Patient and family notified of of transfer: 01/14/22  Discharge Plan and Services     Post Acute Care Choice: Skilled Nursing Facility                               Social Determinants of Health (SDOH) Interventions     Readmission Risk Interventions Readmission Risk Prevention Plan 11/24/2021 08/20/2021 07/12/2021  Transportation Screening Complete Complete Complete  Medication Review Oceanographer) Complete Complete Complete  PCP or Specialist appointment within 3-5 days of discharge Complete Complete Complete  HRI or Home Care Consult Complete Complete Complete  SW Recovery Care/Counseling Consult Complete Complete Complete  Palliative Care Screening Not Applicable Not Applicable Not Applicable  Skilled Nursing Facility Complete Not Applicable Not Applicable  Some recent data might be hidden

## 2022-01-14 NOTE — Discharge Summary (Addendum)
[facility] Discharge Summary  Patient name: Joseph [name] Medical record number: [id number] Date of birth: 1971-10-07 Age: 51 y.o. Gender: male Date of Admission: 01/06/2022  Date of Discharge: 01/14/22 Admitting Physician: Lind Covert, MD  Primary Care Provider: [name], NP Consultants: Ortho  Indication for Hospitalization: Weakness, fall  Discharge Diagnoses/Problem List:  Fall with concern for possible syncope Left foot infection   Sepsis  Covid 19 positive T2DM CAD s/p CABG   PAD AKI Hyperkalemia HLD HTN Bipolar with Depression Disorder GERD COPD Disposition: SNF  Discharge Condition: Improved  Discharge Exam:  Temp:  [97.8 F (36.6 C)] 97.8 F (36.6 C) (01/25 0816) Pulse Rate:  [73] 73 (01/25 0816) Resp:  [17] 17 (01/25 0816) BP: (95-116)/(78-84) 95/78 (01/25 1228) SpO2:  [96 %-97 %] 97 % (01/25 1228) Physical Exam: General: Well appearing, conversational, NAD, white male Cardiovascular: RRR, 2/6 systolic murmur Respiratory: CTABL Abdomen: Soft, NTTP, non-distended Extremities: Moving all extremities independently, no edema, left BKA, stage 1 pressure wound on anterior surface of right foot  Brief Hospital Course:  Joseph [name] is a 51 y.o. male who was admitted to the [facility] Teaching Service at [facility] for generalized weakness and Fall. Hospital course is outlined below by system.   Fall due to possible syncope Presented in the ED after a fall which he attributed to generalized weakness.there is low concern for seizure considering lack of postictal state. With his complex cardiac history ACS was ruled out and no signs of arrhythmia given normal troponin and EKG showed SR with no ST changes. Recent echo showed EF of 55 and on exam appears euvolemic with no BLE edema or JVD.  Pulmonary and cardiac exam were normal.  Given mild BP on admission and active infection of his left foot there was strong suspicion  for syncope in the setting of active infection. Patient received bolus fluid and was transitioned to maintenance IVF. Patient had no complains of syncope by discharge  Left foot infection   sepsis Patient recently had a TMT 2 weeks ago, and on admission his left toe was found to be edematous, erythematous, and draining pus. Lactic acid was elevated to 3.0, with fluid resuscitation improved to 2.2.  In addition he had leukocytosis with white blood count of 12.3 and left foot x-ray showed a third digit destruction of the distal stump of the second through fourth metatarsal with soft tissues swelling suggestive of osteomyelitis.  He was started on antibiotics of cefepime, vancomycin and Zosyn.  Orthopedic surgery was consulted who performed BKA and recommended 24 hours of antibiotics were patient was transitioned to vancomycin, cefepime and Flagyl.  For pain management patient received Dilaudid and oxycodone as needed.  By discharge patient had been transitioned to home opoid dose of 15 mg Oxycodone 5 times a day.  PT/OT was consulted post surgery and they recommended SNF. Patient continued to progress with PT/OT. By discharge patient was medically stable and appropriate for SNF.  COVID-19 positive On admission patient was found to be positive.  He reports testing positive for COVID January 6 and that was treated outpatient by his PCP.  He reports completing his treatment prescribed by his PCP.   Type 2 diabetes His A1c on admission was 8.4 on admission.  CBG was monitored closely during stay and was adjusted to his needs.  He received up to 45 units daily of long acting insulin, and was on sliding scale while admitted.  Metformin was temporarily held due  to his AKI.  At time of discharge patient's medications were restarted. His sugars were controlled during admission.   AKI On admission patient's creatinine was 2.22 and his baseline is 1.45.  Likely prerenal attributed to his sepsis.  With fluid  resuscitation creatinine improved and at discharge patient's creatinine had improved to 1.10.   Hyperkalemia Patient was found to be hyperkalemic on admission with a potassium of 6.3 and he was started on Lokelma 10 mg twice daily with daily monitoring of his electrolyte levels.  EKG on admission was sinus rhythm with no ST changes.  At time of discharge patient's potassium was stable at 4.7  Right foot wound Patient had stage 1 pressure wound on dorsal surface of right foot. Wound bed pink with dry skin surrounding a central dark spot. Wound remained stable over admission and was seen by wound care who recommend BID cleaning with soap and water, and a moisturizing lotion. Preliminary ABI's showed mild right lower extremity arterial disease.   PCP follow-up recommendations 8 mm left solid pulmonary nodule within the upper lobe. Recommend a non-contrast Chest CT at 6-12 months. If patient is high risk for malignancy, recommend an additional non-contrast Chest CT at 18-24 months; if patient is low risk for malignancy a non-contrast Chest CT at 18-24 months is optional. DME equipment: Bariatric Wheelchair, tub/shower bench, BSC/3in1 (removable arm and leg rests, bariatric size, wheelchair platform for residual limb s/p BKA; slide board, drop arm bariatric BSC) Keep eye on R foot pressure wound, stage 1, BID wound care Consider weaning pain meds as patient consistently falling asleep during exams  Held Coreg, Imdur for soft Bps, consider adding back outptaient if BP normalizes  Insulin decreased to 45 units, consider increasing depending on sugars outpatient  Follow up on final ABI results.     Significant Procedures:  BKA 01/08/22  Significant Labs and Imaging:  Recent Labs  Lab 01/10/22 0516 01/11/22 0516 01/12/22 0332  WBC 8.1 7.9 8.6  HGB 8.1* 8.6* 8.9*  HCT 26.8* 29.2* 29.6*  PLT 276 275 294   Recent Labs  Lab 01/07/22 1515 01/07/22 2022 01/07/22 2022 01/08/22 0209  01/09/22 0912 01/10/22 0302 01/11/22 0516  NA  --  138  --  139 140 138 136  K  --  5.0   < > 4.8 5.1 4.5 4.7  CL  --  105  --  107 109 108 104  CO2  --  23  --  24 24 23 22   GLUCOSE  --  156*  --  232* 206* 199* 205*  BUN  --  31*  --  29* 23* 21* 26*  CREATININE  --  1.57*  --  1.45* 1.37* 1.18 1.10  CALCIUM  --  8.7*  --  8.7* 8.0* 8.0* 8.3*  MG 1.2*  --   --   --   --   --   --   ALKPHOS  --  73  --   --   --   --   --   AST  --  29  --   --   --   --   --   ALT  --  17  --   --   --   --   --   ALBUMIN  --  2.4*  --   --   --   --   --    < > = values in this interval not displayed.      Results/Tests  Pending at Time of Discharge:  None  Discharge Medications:  Allergies as of 01/14/2022       Reactions   Sulfa Antibiotics Other (See Comments)   Headaches        Medication List     STOP taking these medications    carvedilol 3.125 MG tablet Commonly known as: COREG   doxycycline 100 MG tablet Commonly known as: VIBRA-TABS   isosorbide mononitrate 60 MG 24 hr tablet Commonly known as: IMDUR   lamoTRIgine 25 MG tablet Commonly known as: LAMICTAL   nutrition supplement (JUVEN) Pack       TAKE these medications    albuterol 108 (90 Base) MCG/ACT inhaler Commonly known as: VENTOLIN HFA Inhale 2 puffs into the lungs every 6 (six) hours as needed for wheezing or shortness of breath.   ALPRAZolam 1 MG tablet Commonly known as: XANAX Take 0.5 tablets (0.5 mg total) by mouth at bedtime. 1 mg in the morning  2 mg at bedtime What changed:  how much to take when to take this reasons to take this additional instructions   aspirin EC 81 MG tablet Take 81 mg by mouth daily. Swallow whole.   atorvastatin 80 MG tablet Commonly known as: LIPITOR Take 1 tablet (80 mg total) by mouth daily at 6 PM.   buPROPion 150 MG 12 hr tablet Commonly known as: WELLBUTRIN SR Take 1 tablet (150 mg total) by mouth 2 (two) times daily.   clopidogrel 75 MG  tablet Commonly known as: PLAVIX Take 1 tablet (75 mg total) by mouth daily with breakfast.   fluticasone 50 MCG/ACT nasal spray Commonly known as: FLONASE Place 2 sprays into both nostrils daily.   insulin aspart 100 UNIT/ML FlexPen Commonly known as: NOVOLOG Inject 6 Units into the skin 3 (three) times daily with meals. Per sliding scale   insulin glargine 100 UNIT/ML injection Commonly known as: LANTUS Inject 0.45 mLs (45 Units total) into the skin daily. What changed:  how much to take when to take this   liraglutide 18 MG/3ML Sopn Commonly known as: VICTOZA Inject 1.8 mg into the skin daily. What changed: how much to take   loratadine 10 MG tablet Commonly known as: CLARITIN Take 10 mg by mouth daily.   metFORMIN 1000 MG tablet Commonly known as: GLUCOPHAGE Take 1 tablet (1,000 mg total) by mouth 2 (two) times daily with a meal.   naloxone 4 MG/0.1ML Liqd nasal spray kit Commonly known as: NARCAN Place 4 mg into the nose as needed (overdose).   oxyCODONE 15 MG immediate release tablet Commonly known as: ROXICODONE Take 15 mg by mouth 5 (five) times daily.   pantoprazole 40 MG tablet Commonly known as: PROTONIX Take 1 tablet (40 mg total) by mouth daily. What changed: how much to take   pregabalin 150 MG capsule Commonly known as: LYRICA Take 2 capsules (300 mg total) by mouth in the morning.   pregabalin 150 MG capsule Commonly known as: LYRICA Take 1 capsule (150 mg total) by mouth at bedtime.   ranolazine 500 MG 12 hr tablet Commonly known as: RANEXA Take 1 tablet (500 mg total) by mouth 2 (two) times daily.   Vascepa 1 g capsule Generic drug: icosapent Ethyl Take 2 capsules (2 g total) by mouth 2 (two) times daily.        Discharge Instructions: Please refer to Patient Instructions section of EMR for full details.  Patient was counseled important signs and symptoms that should prompt return to medical  care, changes in medications, dietary  instructions, activity restrictions, and follow up appointments.   Follow-Up Appointments:  Follow-up Information     Newt Minion, MD Follow up in 1 week(s).   Specialty: Orthopedic Surgery Contact information: [place] [address] [phone]                 Holley Bouche, MD 01/14/2022, 12:13 PM PGY-1, [place]
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
## 2022-01-14 NOTE — Progress Notes (Signed)
FPTS Brief Note Reviewed patient's vitals, recent notes.  Vitals:   01/13/22 0816 01/13/22 1228  BP: 116/84 95/78  Pulse: 73   Resp: 17   Temp: 97.8 F (36.6 C)   SpO2: 96% 97%   At this time, no change in plan from day progress note.  Alen Bleacher, MD Page [phone] with questions about this patient.

## 2022-01-14 NOTE — TOC Progression Note (Signed)
Transition of Care [facility]) - Progression Note    Patient Details  Name: Joseph [name] MRN: [id number] Date of Birth: 1971-07-14  [facility] CM/SW Contact  Lorri Frederick, LCSW Phone Number: 01/14/2022, 8:24 AM  Clinical Narrative:  [facility] pt today.  Auth approved in Navi: Plan auth ID: [id number], [id number], 5 days 1/26-1/30.     Expected Discharge Plan: Skilled Nursing Facility Barriers to Discharge: SNF Pending bed offer  Expected Discharge Plan and Services Expected Discharge Plan: Skilled Nursing Facility     Post Acute Care Choice: Skilled Nursing Facility Living arrangements for the past 2 months: Single Family Home                                       Social Determinants of Health (SDOH) Interventions    Readmission Risk Interventions Readmission Risk Prevention Plan 11/24/2021 08/20/2021 07/12/2021  Transportation Screening Complete Complete Complete  Medication Review Oceanographer) Complete Complete Complete  PCP or Specialist appointment within 3-5 days of discharge Complete Complete Complete  HRI or Home Care Consult Complete Complete Complete  SW Recovery Care/Counseling Consult Complete Complete Complete  Palliative Care Screening Not Applicable Not Applicable Not Applicable  Skilled Nursing Facility Complete Not Applicable Not Applicable  Some recent data might be hidden

## 2022-01-14 NOTE — Progress Notes (Signed)
Family Medicine Teaching Service Daily Progress Note Intern Pager: [phone]  Patient name: Joseph [name] Medical record number: [id number] Date of birth: Dec 18, 1971 Age: 51 y.o. Gender: male  Primary Care Provider: [name], NP Consultants: Ortho Code Status: Full  Pt Overview and Major Events to Date:  1/19- Admitted 1/20- Left BKA  Assessment and Plan:  Bahe Sankey is a 51 year old male presenting with weakness and fall s/p BKA for osteomyelitis of foot causing sepsis.  PMH is significant for CAD s/p CABG x4, TAD, T2DM, HTN, HLD, PE, COPD, GERD and left toe amputation.   Fall with concern for possible syncope  POD #6 of Left BKA, s/p vanc/cefepime/flagyl Patient medically stable awaiting SNF. SNF authorization went out yesterday. Patient continues to rate pain 10/10 although shows no signs of distress. Patient progressing with PT/OT.  -PT/OT -Continue Oxycodone 15 mg x 5 a day.   Stage 1 pressure wound (stable) on R. Dorsum of foot Daily wound care with cleaning and moisturizer. ABI ordered, scheduled for 12 pm today 01/14/22. -F/u ABI -Wound care BID -Continue to monitor   DM2 Sugars ranging from 185-277 yesterday. Patient received 22 units short acting and 40 units long acting. Due to start 45 units long acting this morning -Continue to monitor   All other conditions chronic and stable CAD s/p CABG   PAD GERD COPD Bipolar with depression disorder  FEN/GI: Carb modified PPx: Lovenox Dispo:SNF today. .   Subjective:  Patient laying comfortably in bed, nodding off while talking, noting that pain was 10/10. Patient was not excited about going to a SNF, because he had a bad experience in past.   Objective: Temp:  [97.8 F (36.6 C)] 97.8 F (36.6 C) (01/25 0816) Pulse Rate:  [73] 73 (01/25 0816) Resp:  [17] 17 (01/25 0816) BP: (95-116)/(78-84) 95/78 (01/25 1228) SpO2:  [96 %-97 %] 97 % (01/25 1228) Physical Exam: General: Well appearing,  conversational, NAD, falling asleep while talking, white male Cardiovascular: RRR, 2/6 systolic murmur Respiratory: CTABL Abdomen: Soft, NTTP, non-distended Extremities: Moving all extremities independently, no edema, left BKA, stage 1 pressure wound on anterior surface of right foot  Laboratory: Recent Labs  Lab 01/10/22 0516 01/11/22 0516 01/12/22 0332  WBC 8.1 7.9 8.6  HGB 8.1* 8.6* 8.9*  HCT 26.8* 29.2* 29.6*  PLT 276 275 294   Recent Labs  Lab 01/07/22 2022 01/08/22 0209 01/09/22 0912 01/10/22 0302 01/11/22 0516  NA 138   < > 140 138 136  K 5.0   < > 5.1 4.5 4.7  CL 105   < > 109 108 104  CO2 23   < > 24 23 22   BUN 31*   < > 23* 21* 26*  CREATININE 1.57*   < > 1.37* 1.18 1.10  CALCIUM 8.7*   < > 8.0* 8.0* 8.3*  PROT 6.7  --   --   --   --   BILITOT 0.2*  --   --   --   --   ALKPHOS 73  --   --   --   --   ALT 17  --   --   --   --   AST 29  --   --   --   --   GLUCOSE 156*   < > 206* 199* 205*   < > = values in this interval not displayed.      Imaging/Diagnostic Tests:   Holley Bouche, MD 01/14/2022, 6:27 AM PGY-1, [place]  [facility] FPTS Intern pager: [phone], text pages welcome

## 2022-01-14 NOTE — Progress Notes (Signed)
Discharge summary/pertinent documents provided to [facility] stafff. Pt d/c to [facility] as ordered, Report given to [place], Health and safety inspector. All questions and concerns were fully answered. Pt remains alert/oriented in no apparent distress.

## 2022-01-18 ENCOUNTER — Telehealth: Payer: Self-pay | Admitting: Orthopedic Surgery

## 2022-01-18 NOTE — Telephone Encounter (Signed)
[facility], per AF pt was d/c from the hospital on Thursday and needs post op appt with either [name] or Erin on Thursday or Friday

## 2022-01-19 ENCOUNTER — Other Ambulatory Visit: Payer: Self-pay

## 2022-01-19 ENCOUNTER — Ambulatory Visit (INDEPENDENT_AMBULATORY_CARE_PROVIDER_SITE_OTHER): Payer: Medicare HMO | Admitting: Family

## 2022-01-19 DIAGNOSIS — Z89512 Acquired absence of left leg below knee: Secondary | ICD-10-CM

## 2022-01-20 ENCOUNTER — Encounter: Payer: Self-pay | Admitting: Family

## 2022-01-20 NOTE — Progress Notes (Signed)
Post-Op Visit Note   Patient: Joseph [name]           Date of Birth: 03/18/1971           MRN: [id number] Visit Date: 01/19/2022 PCP: [name], NP  Chief Complaint:  Chief Complaint  Patient presents with   Left Leg - Routine Post Op    Left BKA 01/08/22 Pt had a fall yesterday morning at SNF, slid off bed Photo taken No drainage in wound vac Wearing limb protector    HPI:  HPI The patient is a 51 year old gentleman who presents status post left below-knee amputation.  Overall he feels he is doing well he is wearing his limb protector today however yesterday he did have a fall from the bed to the floor.  At that time he was not wearing his limb protector and had direct impact on his stump Ortho Exam On examination of the left residual limb this is well approximated with staples there is no active drainage no dehiscence there is no erythema Visit Diagnoses:  1. History of left below knee amputation ([place])     Plan: Begin daily Dial soap cleansing.  Dry dressing changes.  Hope to harvest the staples in 2 weeks and follow-up given an order for his prosthesis set up.  Follow-Up Instructions: Return in about 2 weeks (around 02/02/2022).   Imaging: No results found.  Orders:  No orders of the defined types were placed in this encounter.  No orders of the defined types were placed in this encounter.    PMFS History: Patient Active Problem List   Diagnosis Date Noted   Ulcer of right foot, limited to breakdown of skin ([place])    Below-knee amputation of left lower extremity ([place])    Osteomyelitis ([place]) 01/06/2022   Bipolar disorder with depression ([place])    Sepsis ([place]) 11/18/2021   Chest pain 11/18/2021   Dyspnea 11/18/2021   Wound infection    Ulcer of left foot with necrosis of bone ([place])    Bicuspid aortic valve 09/14/2021   Aneurysm of ascending aorta 09/14/2021   Hyperkalemia 08/20/2021   DM (diabetes mellitus) with peripheral vascular complication ([place])  [id number]   Atypical chest pain 07/17/2021   Hyponatremia 07/17/2021   Leg wound, left 07/17/2021   Essential hypertension 07/17/2021   Acute hyperglycemia 07/09/2021   Left leg cellulitis 04/11/2021   Cellulitis of left leg 03/05/2021   Cellulitis 02/18/2021   PAD (peripheral artery disease) ([place]) 10/10/2020   Osteomyelitis of fourth toe of right foot (HCC)    S/P CABG x 4 07/24/2020   Non-ST elevation (NSTEMI) myocardial infarction ([place]) 07/22/2020   Acute on chronic diastolic heart failure ([place]) 07/22/2020   Hypotension 07/18/2020   Left leg swelling 07/18/2020   Elevated troponin 07/18/2020   AKI (acute kidney injury) ([place]) 07/18/2020   Elevated brain natriuretic peptide (BNP) level 07/18/2020   GERD (gastroesophageal reflux disease) 07/18/2020   Unstable angina ([place]) 11/01/2019   Edema 06/13/2019   Leukocytosis 05/31/2019   Smoker 03/08/2019   Chronic pain 03/08/2019   Chronic back pain 02/16/2019   Deep venous thrombosis ([place]) 02/16/2019   Obesity, Class III, BMI 40-49.9 (morbid obesity) ([place]) 02/16/2019   Peripheral nerve disease 02/16/2019   COPD (chronic obstructive pulmonary disease) ([place]) 01/22/2019   Anxiety disorder 03/26/2018   Open wound of toe 10/12/2017   Sinusitis 10/12/2017   Abdominal pain    Loss of weight    Diabetic foot infection ([place]) 09/16/2017  Uncontrolled type 2 diabetes mellitus with hyperglycemia, with long-term current use of insulin (HCC) 09/16/2017   Cellulitis of right foot    Chronic ulcer of great toe of right foot (HCC) 09/09/2017   Recurrent chest pain 07/29/2016   Hyperglycemia    Insulin dependent type 2 diabetes mellitus (HCC) 04/29/2015   OSA (obstructive sleep apnea) 05/08/2013   History of pulmonary embolus (PE) 04/27/2013   CAD -S/P PCI 11/02/19  09/10/2008   Hyperlipidemia 09/09/2008   Past Medical History:  Diagnosis Date   Aneurysm of ascending aorta 09/14/2021   Anxiety    Arthritis    "knees, shoulders, hips,  ankles" (07/29/2016)   Asthma    Bicuspid aortic valve 09/14/2021   CAD (coronary artery disease)    a. 2017: s/p BMS to distal Cx; b. LHC 05/30/2019: 80% mid, distal RCA s/p DES, 30% narrowing of d LM, widely patent LAD w/ luminal irregularities, widely patent stent in dCX  w/ 90+% stenosis distal to stent beofre small trifurcating obtuse marginal (potentially area of restenosis)   Chronic bronchitis (HCC)    Chronic ulcer of right great toe (HCC) 09/19/2017   COPD (chronic obstructive pulmonary disease) (HCC)    Depression    DVT (deep venous thrombosis) (HCC)    GERD (gastroesophageal reflux disease)    Hyperlipidemia    Hypertension    Myocardial infarction (HCC) 1996   "light one"   PE (pulmonary embolism) 04/2013   On chronic Xarelto   Peripheral nerve disease    Pneumonia "several times"   Sleep apnea    Type II diabetes mellitus (HCC)     Family History  Problem Relation Age of Onset   Hypertension Brother    Hypertension Father    Diabetes Other    Hyperlipidemia Other     Past Surgical History:  Procedure Laterality Date   ABDOMINAL AORTOGRAM W/LOWER EXTREMITY N/A 10/10/2020   Procedure: ABDOMINAL AORTOGRAM W/LOWER EXTREMITY;  Surgeon: Sherren Kerns, MD;  [facility];  Service: Cardiovascular;  Laterality: N/A;   AMPUTATION Right 09/21/2017   Procedure: RIGHT GREAT TOE AMPUTATION, POSSIBLE VAC;  Surgeon: Tarry Kos, MD;  [facility];  Service: Orthopedics;  Laterality: Right;   AMPUTATION Left 08/13/2020   Procedure: LEFT FOOT 4TH RAY AMPUTATION;  Surgeon: [name], MD;  Location[facility];  Service: Orthopedics;  Laterality: Left;   AMPUTATION Left 11/20/2021   Procedure: LEFT TRANSMETATARSAL AMPUTATION;  Surgeon: [name], MD;  Location: [facility];  Service: Orthopedics;  Laterality: Left;   AMPUTATION Left 01/08/2022   Procedure: LEFT BELOW KNEE AMPUTATION;  Surgeon: [name], MD;  Location: [facility] OR;  Service: Orthopedics;   Laterality: Left;   AORTOGRAM Bilateral 03/13/2021   Procedure: ABDOMINAL AORTOGRAM WITH Left LOWER EXTREMITY RUNOFF;  Surgeon: Leonie Douglas, MD;  Location: [facility] OR;  Service: Vascular;  Laterality: Bilateral;   APPLICATION OF WOUND VAC  01/08/2022   Procedure: APPLICATION OF WOUND VAC;  Surgeon: [name], MD;  [facility];  Service: Orthopedics;;   CARDIAC CATHETERIZATION  2006   CARDIAC CATHETERIZATION  1996   "@ [facility]; when I had my heart attack"   CARDIAC CATHETERIZATION N/A 07/29/2016   Procedure: Left Heart Cath and Coronary Angiography;  Surgeon: Runell Gess, MD;  Location: [facility] INVASIVE CV LAB;  Service: Cardiovascular;  Laterality: N/A;   CARDIAC CATHETERIZATION N/A 07/29/2016   Procedure: Coronary Stent Intervention;  Surgeon: Runell Gess, MD;  Location: [facility]  INVASIVE CV LAB;  Service: Cardiovascular;  Laterality: N/A;   CARPAL TUNNEL RELEASE Bilateral    CORONARY ANGIOPLASTY WITH STENT PLACEMENT  07/29/2016   CORONARY ARTERY BYPASS GRAFT N/A 07/24/2020   Procedure: CORONARY ARTERY BYPASS GRAFTING (CABG), ON PUMP, TIMES FOUR, USING LEFT INTERNAL MAMMARY ARTERY AND ENDOSCOPICALLY HARVESTED RIGHT GREATER SAPHENOUS VEIN;  Surgeon: Lajuana Matte, MD;  Location: [place];  Service: Open Heart Surgery;  Laterality: N/A;  FLOW TAC   CORONARY STENT INTERVENTION N/A 05/30/2019   Procedure: CORONARY STENT INTERVENTION;  Surgeon: Belva Crome, MD;  Location: [place] CV LAB;  Service: Cardiovascular;  Laterality: N/A;   CORONARY STENT INTERVENTION N/A 11/02/2019   Procedure: CORONARY STENT INTERVENTION;  Surgeon: [place] Hampshire, MD;  Location: [place] CV LAB;  Service: Cardiovascular;  Laterality: N/A;   ESOPHAGOGASTRODUODENOSCOPY N/A 09/22/2017   Procedure: ESOPHAGOGASTRODUODENOSCOPY (EGD);  Surgeon: Ladene Artist, MD;  Location: [facility] ENDOSCOPY;  Service: Endoscopy;  Laterality: N/A;   INTRAVASCULAR PRESSURE WIRE/FFR STUDY N/A 07/22/2020   Procedure: INTRAVASCULAR  PRESSURE WIRE/FFR STUDY;  Surgeon: Leonie Man, MD;  Location: [place] CV LAB;  Service: Cardiovascular;  Laterality: N/A;   KNEE ARTHROSCOPY Bilateral    "2 on left; 1 on the right"   LEFT HEART CATH AND CORONARY ANGIOGRAPHY N/A 11/02/2019   Procedure: LEFT HEART CATH AND CORONARY ANGIOGRAPHY;  Surgeon: [place] Hampshire, MD;  Location: [place] CV LAB;  Service: Cardiovascular;  Laterality: N/A;   LEFT HEART CATH AND CORONARY ANGIOGRAPHY N/A 07/22/2020   Procedure: LEFT HEART CATH AND CORONARY ANGIOGRAPHY;  Surgeon: Leonie Man, MD;  Location: [place] CV LAB;  Service: Cardiovascular;  Laterality: N/A;   LEFT HEART CATH AND CORS/GRAFTS ANGIOGRAPHY N/A 08/17/2021   Procedure: LEFT HEART CATH AND CORS/GRAFTS ANGIOGRAPHY;  Surgeon: Nelva Bush, MD;  Location: [place] CV LAB;  Service: Cardiovascular;  Laterality: N/A;   PERIPHERAL VASCULAR INTERVENTION Left 10/11/2020   popliteal and SFA stent placement    PERIPHERAL VASCULAR INTERVENTION Left 10/10/2020   Procedure: PERIPHERAL VASCULAR INTERVENTION;  Surgeon: Elam Dutch, MD;  Location: [place] CV LAB;  Service: Cardiovascular;  Laterality: Left;   RIGHT/LEFT HEART CATH AND CORONARY ANGIOGRAPHY N/A 05/30/2019   Procedure: RIGHT/LEFT HEART CATH AND CORONARY ANGIOGRAPHY;  Surgeon: Belva Crome, MD;  Location: [place] CV LAB;  Service: Cardiovascular;  Laterality: N/A;   SHOULDER OPEN ROTATOR CUFF REPAIR Bilateral    TEE WITHOUT CARDIOVERSION N/A 07/24/2020   Procedure: TRANSESOPHAGEAL ECHOCARDIOGRAM (TEE);  Surgeon: Lajuana Matte, MD;  Location: [place];  Service: Open Heart Surgery;  Laterality: N/A;   Social History   Occupational History   Occupation: disabled     Fish farm manager: UNEMPLOYED  Tobacco Use   Smoking status: Former    Packs/day: 1.00    Years: 34.00    Pack years: 34.00    Types: Cigarettes    Quit date: 07/12/2020    Years since quitting: 1.5   Smokeless tobacco: Former    Types:  Snuff, Chew  Vaping Use   Vaping Use: Never used  Substance and Sexual Activity   Alcohol use: Yes    Comment: RARE   Drug use: No   Sexual activity: Yes

## 2022-01-21 ENCOUNTER — Encounter: Payer: Medicare HMO | Admitting: Orthopedic Surgery

## 2022-02-02 ENCOUNTER — Encounter: Payer: Medicare HMO | Admitting: Family

## 2022-02-02 ENCOUNTER — Encounter: Payer: Medicare HMO | Admitting: Orthopedic Surgery

## 2022-02-05 ENCOUNTER — Other Ambulatory Visit: Payer: Self-pay

## 2022-02-05 ENCOUNTER — Ambulatory Visit (INDEPENDENT_AMBULATORY_CARE_PROVIDER_SITE_OTHER): Payer: Medicare HMO | Admitting: Family

## 2022-02-05 ENCOUNTER — Encounter: Payer: Self-pay | Admitting: Family

## 2022-02-05 DIAGNOSIS — Z89512 Acquired absence of left leg below knee: Secondary | ICD-10-CM

## 2022-02-05 NOTE — Progress Notes (Signed)
Post-Op Visit Note   Patient: Joseph [name]           Date of Birth: 11-Jul-1971           MRN: [id number] Visit Date: 02/05/2022 PCP: Marva Panda, NP  Chief Complaint:  Chief Complaint  Patient presents with   Left Leg - Routine Post Op    Left BKA 01/08/22 S/p fall at home several days ago transitioning from bedside commode to bed.     HPI:  HPI The patient is a 51 year old gentleman seen status post left below-knee amputation.  He has had further falls at home onto his residual limb.  Overall he feels like he is doing well feels ready to be set up with his prosthesis. Ortho Exam Incision well-healed staples harvested today without incident there is no erythema no drainage no gaping  Visit Diagnoses: No diagnosis found.  Plan: Given an order to biotech for his prosthesis set up.  Follow-up in the office in 3 weeks  Follow-Up Instructions: No follow-ups on file.   Imaging: No results found.  Orders:  No orders of the defined types were placed in this encounter.  No orders of the defined types were placed in this encounter.    PMFS History: Patient Active Problem List   Diagnosis Date Noted   Ulcer of right foot, limited to breakdown of skin (HCC)    Below-knee amputation of left lower extremity (HCC)    Osteomyelitis (HCC) 01/06/2022   Bipolar disorder with depression (HCC)    Sepsis (HCC) 11/18/2021   Chest pain 11/18/2021   Dyspnea 11/18/2021   Wound infection    Ulcer of left foot with necrosis of bone (HCC)    Bicuspid aortic valve 09/14/2021   Aneurysm of ascending aorta 09/14/2021   Hyperkalemia 08/20/2021   DM (diabetes mellitus) with peripheral vascular complication (HCC) 08/20/2021   Atypical chest pain 07/17/2021   Hyponatremia 07/17/2021   Leg wound, left 07/17/2021   Essential hypertension 07/17/2021   Acute hyperglycemia 07/09/2021   Left leg cellulitis 04/11/2021   Cellulitis of left leg 03/05/2021   Cellulitis 02/18/2021   PAD  (peripheral artery disease) (HCC) 10/10/2020   Osteomyelitis of fourth toe of right foot (HCC)    S/P CABG x 4 07/24/2020   Non-ST elevation (NSTEMI) myocardial infarction (HCC) 07/22/2020   Acute on chronic diastolic heart failure (HCC) 07/22/2020   Hypotension 07/18/2020   Left leg swelling 07/18/2020   Elevated troponin 07/18/2020   AKI (acute kidney injury) (HCC) 07/18/2020   Elevated brain natriuretic peptide (BNP) level 07/18/2020   GERD (gastroesophageal reflux disease) 07/18/2020   Unstable angina (HCC) 11/01/2019   Edema 06/13/2019   Leukocytosis 05/31/2019   Smoker 03/08/2019   Chronic pain 03/08/2019   Chronic back pain 02/16/2019   Deep venous thrombosis (HCC) 02/16/2019   Obesity, Class III, BMI 40-49.9 (morbid obesity) (HCC) 02/16/2019   Peripheral nerve disease 02/16/2019   COPD (chronic obstructive pulmonary disease) (HCC) 01/22/2019   Anxiety disorder 03/26/2018   Open wound of toe 10/12/2017   Sinusitis 10/12/2017   Abdominal pain    Loss of weight    Diabetic foot infection (HCC) 09/16/2017   Uncontrolled type 2 diabetes mellitus with hyperglycemia, with long-term current use of insulin (HCC) 09/16/2017   Cellulitis of right foot    Chronic ulcer of great toe of right foot (HCC) 09/09/2017   Recurrent chest pain 07/29/2016   Hyperglycemia    Insulin dependent type 2 diabetes mellitus (HCC) 04/29/2015  OSA (obstructive sleep apnea) 05/08/2013   History of pulmonary embolus (PE) 04/27/2013   CAD -S/P PCI 11/02/19  09/10/2008   Hyperlipidemia 09/09/2008   Past Medical History:  Diagnosis Date   Aneurysm of ascending aorta 09/14/2021   Anxiety    Arthritis    "knees, shoulders, hips, ankles" (07/29/2016)   Asthma    Bicuspid aortic valve 09/14/2021   CAD (coronary artery disease)    a. 2017: s/p BMS to distal Cx; b. LHC 05/30/2019: 80% mid, distal RCA s/p DES, 30% narrowing of d LM, widely patent LAD w/ luminal irregularities, widely patent stent in dCX  w/  90+% stenosis distal to stent beofre small trifurcating obtuse marginal (potentially area of restenosis)   Chronic bronchitis (HCC)    Chronic ulcer of right great toe (HCC) 09/19/2017   COPD (chronic obstructive pulmonary disease) (HCC)    Depression    DVT (deep venous thrombosis) (HCC)    GERD (gastroesophageal reflux disease)    Hyperlipidemia    Hypertension    Myocardial infarction (HCC) 1996   "light one"   PE (pulmonary embolism) 04/2013   On chronic Xarelto   Peripheral nerve disease    Pneumonia "several times"   Sleep apnea    Type II diabetes mellitus (HCC)     Family History  Problem Relation Age of Onset   Hypertension Brother    Hypertension Father    Diabetes Other    Hyperlipidemia Other     Past Surgical History:  Procedure Laterality Date   ABDOMINAL AORTOGRAM W/LOWER EXTREMITY N/A 10/10/2020   Procedure: ABDOMINAL AORTOGRAM W/LOWER EXTREMITY;  Surgeon: Sherren Kerns, MD;  [facility];  Service: Cardiovascular;  Laterality: N/A;   AMPUTATION Right 09/21/2017   Procedure: RIGHT GREAT TOE AMPUTATION, POSSIBLE VAC;  Surgeon: Tarry Kos, MD;  [facility];  Service: Orthopedics;  Laterality: Right;   AMPUTATION Left 08/13/2020   Procedure: LEFT FOOT 4TH RAY AMPUTATION;  Surgeon: [name], MD;  Location: [facility] OR;  Service: Orthopedics;  Laterality: Left;   AMPUTATION Left 11/20/2021   Procedure: LEFT TRANSMETATARSAL AMPUTATION;  Surgeon: [name], MD;  Location: [facility] OR;  Service: Orthopedics;  Laterality: Left;   AMPUTATION Left 01/08/2022   Procedure: LEFT BELOW KNEE AMPUTATION;  Surgeon: [name], MD;  Location: [facility] OR;  Service: Orthopedics;  Laterality: Left;   AORTOGRAM Bilateral 03/13/2021   Procedure: ABDOMINAL AORTOGRAM WITH Left LOWER EXTREMITY RUNOFF;  Surgeon: Leonie Douglas, MD;  Location: [facility] OR;  Service: Vascular;  Laterality: Bilateral;   APPLICATION OF WOUND VAC  01/08/2022   Procedure: APPLICATION OF WOUND  VAC;  Surgeon: [name], MD;  [facility];  Service: Orthopedics;;   CARDIAC CATHETERIZATION  2006   CARDIAC CATHETERIZATION  1996   "@ [facility]; when I had my heart attack"   CARDIAC CATHETERIZATION N/A 07/29/2016   Procedure: Left Heart Cath and Coronary Angiography;  Surgeon: Runell Gess, MD;  Location: [facility] INVASIVE CV LAB;  Service: Cardiovascular;  Laterality: N/A;   CARDIAC CATHETERIZATION N/A 07/29/2016   Procedure: Coronary Stent Intervention;  Surgeon: Runell Gess, MD;  [facility];  Service: Cardiovascular;  Laterality: N/A;   CARPAL TUNNEL RELEASE Bilateral    CORONARY ANGIOPLASTY WITH STENT PLACEMENT  07/29/2016   CORONARY ARTERY BYPASS GRAFT N/A 07/24/2020   Procedure: CORONARY ARTERY BYPASS GRAFTING (CABG), ON PUMP, TIMES FOUR, USING LEFT INTERNAL MAMMARY ARTERY AND ENDOSCOPICALLY HARVESTED RIGHT GREATER SAPHENOUS  VEIN;  Surgeon: Corliss Skains, MD;  Location: [facility] OR;  Service: Open Heart Surgery;  Laterality: N/A;  FLOW TAC   CORONARY STENT INTERVENTION N/A 05/30/2019   Procedure: CORONARY STENT INTERVENTION;  Surgeon: Lyn Records, MD;  [facility];  Service: Cardiovascular;  Laterality: N/A;   CORONARY STENT INTERVENTION N/A 11/02/2019   Procedure: CORONARY STENT INTERVENTION;  Surgeon: Iran Ouch, MD;  [facility];  Service: Cardiovascular;  Laterality: N/A;   ESOPHAGOGASTRODUODENOSCOPY N/A 09/22/2017   Procedure: ESOPHAGOGASTRODUODENOSCOPY (EGD);  Surgeon: Meryl Dare, MD;  Location: [facility] ENDOSCOPY;  Service: Endoscopy;  Laterality: N/A;   INTRAVASCULAR PRESSURE WIRE/FFR STUDY N/A 07/22/2020   Procedure: INTRAVASCULAR PRESSURE WIRE/FFR STUDY;  Surgeon: Marykay Lex, MD;  Location: [facility] INVASIVE CV LAB;  Service: Cardiovascular;  Laterality: N/A;   KNEE ARTHROSCOPY Bilateral    "2 on left; 1 on the right"   LEFT HEART CATH AND CORONARY ANGIOGRAPHY N/A 11/02/2019   Procedure: LEFT HEART CATH AND  CORONARY ANGIOGRAPHY;  Surgeon: Iran Ouch, MD;  [facility];  Service: Cardiovascular;  Laterality: N/A;   LEFT HEART CATH AND CORONARY ANGIOGRAPHY N/A 07/22/2020   Procedure: LEFT HEART CATH AND CORONARY ANGIOGRAPHY;  Surgeon: Marykay Lex, MD;  Location: [facility] INVASIVE CV LAB;  Service: Cardiovascular;  Laterality: N/A;   LEFT HEART CATH AND CORS/GRAFTS ANGIOGRAPHY N/A 08/17/2021   Procedure: LEFT HEART CATH AND CORS/GRAFTS ANGIOGRAPHY;  Surgeon: Yvonne Kendall, MD;  [facility];  Service: Cardiovascular;  Laterality: N/A;   PERIPHERAL VASCULAR INTERVENTION Left 10/11/2020   popliteal and SFA stent placement    PERIPHERAL VASCULAR INTERVENTION Left 10/10/2020   Procedure: PERIPHERAL VASCULAR INTERVENTION;  Surgeon: Sherren Kerns, MD;  [facility];  Service: Cardiovascular;  Laterality: Left;   RIGHT/LEFT HEART CATH AND CORONARY ANGIOGRAPHY N/A 05/30/2019   Procedure: RIGHT/LEFT HEART CATH AND CORONARY ANGIOGRAPHY;  Surgeon: Lyn Records, MD;  [facility];  Service: Cardiovascular;  Laterality: N/A;   SHOULDER OPEN ROTATOR CUFF REPAIR Bilateral    TEE WITHOUT CARDIOVERSION N/A 07/24/2020   Procedure: TRANSESOPHAGEAL ECHOCARDIOGRAM (TEE);  Surgeon: Corliss Skains, MD;  Location: [facility] OR;  Service: Open Heart Surgery;  Laterality: N/A;   Social History   Occupational History   Occupation: disabled     Associate Professor: UNEMPLOYED  Tobacco Use   Smoking status: Former    Packs/day: 1.00    Years: 34.00    Pack years: 34.00    Types: Cigarettes    Quit date: 07/12/2020    Years since quitting: 1.5   Smokeless tobacco: Former    Types: Snuff, Chew  Vaping Use   Vaping Use: Never used  Substance and Sexual Activity   Alcohol use: Yes    Comment: RARE   Drug use: No   Sexual activity: Yes

## 2022-02-22 ENCOUNTER — Ambulatory Visit (INDEPENDENT_AMBULATORY_CARE_PROVIDER_SITE_OTHER): Payer: Medicare HMO | Admitting: Orthopedic Surgery

## 2022-02-22 ENCOUNTER — Encounter: Payer: Self-pay | Admitting: Orthopedic Surgery

## 2022-02-22 DIAGNOSIS — Z89512 Acquired absence of left leg below knee: Secondary | ICD-10-CM

## 2022-02-22 NOTE — Progress Notes (Signed)
Office Visit Note   Patient: Joseph [name]           Date of Birth: Sep 14, 1971           MRN: [id number] Visit Date: 02/22/2022              Requested by: Joseph Panda, NP [address] [place],  [name] [address] PCP: Joseph Panda, NP  Chief Complaint  Patient presents with   Left Knee - Follow-up    Left transtibial amputation.  Patient is currently scheduled for prosthetic fitting he states he is having pain he is currently on oxycodone 15 mg per his primary care physician and is also on Lyrica 300 mg in the morning 150 mg at night.  HPI: Patient is a 51 year old gentleman who is 6 weeks status post  Assessment & Plan: Visit Diagnoses:  1. History of left below knee amputation [facility])     Plan: Anticipate patient can be fit for his prosthesis at his next appointment.  Recommended pressure offloading over the base of the lateral right foot to prevent this ulceration from worsening.  Follow-Up Instructions: Return in about 4 weeks (around 03/22/2022).   Ortho Exam  Patient is alert, oriented, no adenopathy, well-dressed, normal affect, normal respiratory effort. Examination patient's residual limb is healed nicely he has 2 suture knots that are prominent and these were removed.  There is no redness cellulitis or drainage the stump shrinker was placed back in place size 4 extra-large.  Examination the right foot he has a pressure ulcer over the base of the fifth metatarsal.  This is superficial recommended pressure offloading.  Imaging: No results found. No images are attached to the encounter.  Labs: Lab Results  Component Value Date   HGBA1C 8.4 (H) 01/07/2022   HGBA1C 10.0 (H) 11/18/2021   HGBA1C 11.8 (H) 08/15/2021   ESRSEDRATE 45 (H) 11/18/2021   ESRSEDRATE 41 (H) 09/16/2017   CRP 12.1 (H) 11/18/2021   CRP 8.9 (H) 09/16/2017   REPTSTATUS 01/11/2022 FINAL 01/06/2022   CULT  01/06/2022    NO GROWTH 5 DAYS Performed at [facility], [address] [place], [name] [address]    Joseph [name] ENTEROCOCCUS FAECALIS (A) 01/06/2022     Lab Results  Component Value Date   ALBUMIN 2.4 (L) 01/07/2022   ALBUMIN 2.4 (L) 01/06/2022   ALBUMIN 1.8 (L) 11/25/2021   PREALBUMIN 16.5 (L) 01/07/2022    Lab Results  Component Value Date   MG 1.2 (L) 01/07/2022   MG 1.6 (L) 07/17/2021   MG 1.5 (L) 07/10/2021   Lab Results  Component Value Date   VD25OH 9.40 (L) 01/07/2022    Lab Results  Component Value Date   PREALBUMIN 16.5 (L) 01/07/2022   CBC EXTENDED Latest Ref Rng & Units 01/12/2022 01/11/2022 01/10/2022  WBC 4.0 - 10.5 K/uL 8.6 7.9 8.1  RBC 4.22 - 5.81 MIL/uL 3.62(L) 3.48(L) 3.25(L)  HGB 13.0 - 17.0 g/dL 8.6(V) 7.8(I) 8.1(L)  HCT 39.0 - 52.0 % 29.6(L) 29.2(L) 26.8(L)  PLT 150 - 400 K/uL 294 275 276  NEUTROABS 1.7 - 7.7 K/uL - - -  LYMPHSABS 0.7 - 4.0 K/uL - - -     There is no height or weight on file to calculate BMI.  Orders:  No orders of the defined types were placed in this encounter.  No orders of the defined types were placed in this encounter.    Procedures: No procedures performed  Clinical Data: No additional  findings.  ROS:  All other systems negative, except as noted in the HPI. Review of Systems  Objective: Vital Signs: There were no vitals taken for this visit.  Specialty Comments:  No specialty comments available.  PMFS History: Patient Active Problem List   Diagnosis Date Noted   Ulcer of right foot, limited to breakdown of skin (HCC)    Below-knee amputation of left lower extremity (HCC)    Osteomyelitis (HCC) 01/06/2022   Bipolar disorder with depression (HCC)    Sepsis (HCC) 11/18/2021   Chest pain 11/18/2021   Dyspnea 11/18/2021   Wound infection    Ulcer of left foot with necrosis of bone (HCC)    Bicuspid aortic valve 09/14/2021   Aneurysm of ascending aorta 09/14/2021   Hyperkalemia 08/20/2021   DM (diabetes mellitus) with peripheral vascular complication (HCC) 08/20/2021    Atypical chest pain 07/17/2021   Hyponatremia 07/17/2021   Leg wound, left 07/17/2021   Essential hypertension 07/17/2021   Acute hyperglycemia 07/09/2021   Left leg cellulitis 04/11/2021   Cellulitis of left leg 03/05/2021   Cellulitis 02/18/2021   PAD (peripheral artery disease) (HCC) 10/10/2020   Osteomyelitis of fourth toe of right foot (HCC)    S/P CABG x 4 07/24/2020   Non-ST elevation (NSTEMI) myocardial infarction (HCC) 07/22/2020   Acute on chronic diastolic heart failure (HCC) 07/22/2020   Hypotension 07/18/2020   Left leg swelling 07/18/2020   Elevated troponin 07/18/2020   AKI (acute kidney injury) (HCC) 07/18/2020   Elevated brain natriuretic peptide (BNP) level 07/18/2020   GERD (gastroesophageal reflux disease) 07/18/2020   Unstable angina (HCC) 11/01/2019   Edema 06/13/2019   Leukocytosis 05/31/2019   Smoker 03/08/2019   Chronic pain 03/08/2019   Chronic back pain 02/16/2019   Deep venous thrombosis (HCC) 02/16/2019   Obesity, Class III, BMI 40-49.9 (morbid obesity) (HCC) 02/16/2019   Peripheral nerve disease 02/16/2019   COPD (chronic obstructive pulmonary disease) (HCC) 01/22/2019   Anxiety disorder 03/26/2018   Open wound of toe 10/12/2017   Sinusitis 10/12/2017   Abdominal pain    Loss of weight    Diabetic foot infection (HCC) 09/16/2017   Uncontrolled type 2 diabetes mellitus with hyperglycemia, with long-term current use of insulin (HCC) 09/16/2017   Cellulitis of right foot    Chronic ulcer of great toe of right foot (HCC) 09/09/2017   Recurrent chest pain 07/29/2016   Hyperglycemia    Insulin dependent type 2 diabetes mellitus (HCC) 04/29/2015   OSA (obstructive sleep apnea) 05/08/2013   History of pulmonary embolus (PE) 04/27/2013   CAD -S/P PCI 11/02/19  09/10/2008   Hyperlipidemia 09/09/2008   Past Medical History:  Diagnosis Date   Aneurysm of ascending aorta 09/14/2021   Anxiety    Arthritis    "knees, shoulders, hips, ankles"  (07/29/2016)   Asthma    Bicuspid aortic valve 09/14/2021   CAD (coronary artery disease)    a. 2017: s/p BMS to distal Cx; b. LHC 05/30/2019: 80% mid, distal RCA s/p DES, 30% narrowing of d LM, widely patent LAD w/ luminal irregularities, widely patent stent in dCX  w/ 90+% stenosis distal to stent beofre small trifurcating obtuse marginal (potentially area of restenosis)   Chronic bronchitis (HCC)    Chronic ulcer of right great toe (HCC) 09/19/2017   COPD (chronic obstructive pulmonary disease) (HCC)    Depression    DVT (deep venous thrombosis) (HCC)    GERD (gastroesophageal reflux disease)    Hyperlipidemia    Hypertension  Myocardial infarction [facility]) 1996   "light one"   PE (pulmonary embolism) 04/2013   On chronic Xarelto   Peripheral nerve disease    Pneumonia "several times"   Sleep apnea    Type II diabetes mellitus (HCC)     Family History  Problem Relation Age of Onset   Hypertension Brother    Hypertension Father    Diabetes Other    Hyperlipidemia Other     Past Surgical History:  Procedure Laterality Date   ABDOMINAL AORTOGRAM W/LOWER EXTREMITY N/A 10/10/2020   Procedure: ABDOMINAL AORTOGRAM W/LOWER EXTREMITY;  Surgeon: Sherren Kerns, MD;  [facility]: [facility] INVASIVE CV LAB;  Service: Cardiovascular;  Laterality: N/A;   AMPUTATION Right 09/21/2017   Procedure: RIGHT GREAT TOE AMPUTATION, POSSIBLE VAC;  Surgeon: Tarry Kos, MD;  Location: [facility] OR;  Service: Orthopedics;  Laterality: Right;   AMPUTATION Left 08/13/2020   Procedure: LEFT FOOT 4TH RAY AMPUTATION;  Surgeon: [name], MD;  Location: [facility] OR;  Service: Orthopedics;  Laterality: Left;   AMPUTATION Left 11/20/2021   Procedure: LEFT TRANSMETATARSAL AMPUTATION;  Surgeon: [name], MD;  Location: [facility] OR;  Service: Orthopedics;  Laterality: Left;   AMPUTATION Left 01/08/2022   Procedure: LEFT BELOW KNEE AMPUTATION;  Surgeon: [name], MD;  Location: [facility] OR;  Service: Orthopedics;  Laterality: Left;    AORTOGRAM Bilateral 03/13/2021   Procedure: ABDOMINAL AORTOGRAM WITH Left LOWER EXTREMITY RUNOFF;  Surgeon: Leonie Douglas, MD;  Location: [facility] OR;  Service: Vascular;  Laterality: Bilateral;   APPLICATION OF WOUND VAC  01/08/2022   Procedure: APPLICATION OF WOUND VAC;  Surgeon: [name], MD;  Location: [facility] OR;  Service: Orthopedics;;   CARDIAC CATHETERIZATION  2006   CARDIAC CATHETERIZATION  1996   "@ [facility]; when I had my heart attack"   CARDIAC CATHETERIZATION N/A 07/29/2016   Procedure: Left Heart Cath and Coronary Angiography;  Surgeon: Runell Gess, MD;  Location: [facility] INVASIVE CV LAB;  Service: Cardiovascular;  Laterality: N/A;   CARDIAC CATHETERIZATION N/A 07/29/2016   Procedure: Coronary Stent Intervention;  Surgeon: Runell Gess, MD;  [facility]: [facility];  Service: Cardiovascular;  Laterality: N/A;   CARPAL TUNNEL RELEASE Bilateral    CORONARY ANGIOPLASTY WITH STENT PLACEMENT  07/29/2016   CORONARY ARTERY BYPASS GRAFT N/A 07/24/2020   Procedure: CORONARY ARTERY BYPASS GRAFTING (CABG), ON PUMP, TIMES FOUR, USING LEFT INTERNAL MAMMARY ARTERY AND ENDOSCOPICALLY HARVESTED RIGHT GREATER SAPHENOUS VEIN;  Surgeon: Corliss Skains, MD;  Location: [facility] OR;  Service: Open Heart Surgery;  Laterality: N/A;  FLOW TAC   CORONARY STENT INTERVENTION N/A 05/30/2019   Procedure: CORONARY STENT INTERVENTION;  Surgeon: Lyn Records, MD;  [facility]: [facility];  Service: Cardiovascular;  Laterality: N/A;   CORONARY STENT INTERVENTION N/A 11/02/2019   Procedure: CORONARY STENT INTERVENTION;  Surgeon: Iran Ouch, MD;  [facility]: [facility];  Service: Cardiovascular;  Laterality: N/A;   ESOPHAGOGASTRODUODENOSCOPY N/A 09/22/2017   Procedure: ESOPHAGOGASTRODUODENOSCOPY (EGD);  Surgeon: Meryl Dare, MD;  Location: [facility] ENDOSCOPY;  Service: Endoscopy;  Laterality: N/A;   INTRAVASCULAR PRESSURE WIRE/FFR STUDY N/A 07/22/2020   Procedure: INTRAVASCULAR PRESSURE WIRE/FFR  STUDY;  Surgeon: Marykay Lex, MD;  Location: [facility] INVASIVE CV LAB;  Service: Cardiovascular;  Laterality: N/A;   KNEE ARTHROSCOPY Bilateral    "2 on left; 1 on the right"   LEFT HEART CATH AND CORONARY ANGIOGRAPHY N/A 11/02/2019   Procedure: LEFT HEART CATH AND CORONARY  ANGIOGRAPHY;  Surgeon: Iran Ouch, MD;  Location: [facility] INVASIVE CV LAB;  Service: Cardiovascular;  Laterality: N/A;   LEFT HEART CATH AND CORONARY ANGIOGRAPHY N/A 07/22/2020   Procedure: LEFT HEART CATH AND CORONARY ANGIOGRAPHY;  Surgeon: Marykay Lex, MD;  Location: [facility] INVASIVE CV LAB;  Service: Cardiovascular;  Laterality: N/A;   LEFT HEART CATH AND CORS/GRAFTS ANGIOGRAPHY N/A 08/17/2021   Procedure: LEFT HEART CATH AND CORS/GRAFTS ANGIOGRAPHY;  Surgeon: Yvonne Kendall, MD;  [facility];  Service: Cardiovascular;  Laterality: N/A;   PERIPHERAL VASCULAR INTERVENTION Left 10/11/2020   popliteal and SFA stent placement    PERIPHERAL VASCULAR INTERVENTION Left 10/10/2020   Procedure: PERIPHERAL VASCULAR INTERVENTION;  Surgeon: Sherren Kerns, MD;  [facility];  Service: Cardiovascular;  Laterality: Left;   RIGHT/LEFT HEART CATH AND CORONARY ANGIOGRAPHY N/A 05/30/2019   Procedure: RIGHT/LEFT HEART CATH AND CORONARY ANGIOGRAPHY;  Surgeon: Lyn Records, MD;  [facility];  Service: Cardiovascular;  Laterality: N/A;   SHOULDER OPEN ROTATOR CUFF REPAIR Bilateral    TEE WITHOUT CARDIOVERSION N/A 07/24/2020   Procedure: TRANSESOPHAGEAL ECHOCARDIOGRAM (TEE);  Surgeon: Corliss Skains, MD;  Location: [facility] OR;  Service: Open Heart Surgery;  Laterality: N/A;   Social History   Occupational History   Occupation: disabled     Associate Professor: UNEMPLOYED  Tobacco Use   Smoking status: Former    Packs/day: 1.00    Years: 34.00    Pack years: 34.00    Types: Cigarettes    Quit date: 07/12/2020    Years since quitting: 1.6   Smokeless tobacco: Former    Types: Snuff, Chew   Vaping Use   Vaping Use: Never used  Substance and Sexual Activity   Alcohol use: Yes    Comment: RARE   Drug use: No   Sexual activity: Yes

## 2022-03-02 ENCOUNTER — Ambulatory Visit (HOSPITAL_COMMUNITY): Admission: RE | Admit: 2022-03-02 | Payer: Medicare HMO | Source: Ambulatory Visit

## 2022-03-02 ENCOUNTER — Encounter: Payer: Self-pay | Admitting: Family

## 2022-03-02 ENCOUNTER — Ambulatory Visit (INDEPENDENT_AMBULATORY_CARE_PROVIDER_SITE_OTHER): Payer: Medicare HMO | Admitting: Family

## 2022-03-02 ENCOUNTER — Ambulatory Visit: Payer: Self-pay

## 2022-03-02 DIAGNOSIS — M25561 Pain in right knee: Secondary | ICD-10-CM

## 2022-03-02 DIAGNOSIS — M1711 Unilateral primary osteoarthritis, right knee: Secondary | ICD-10-CM

## 2022-03-02 MED ORDER — PREDNISONE 50 MG PO TABS: ORAL_TABLET | ORAL | 0 refills | Status: DC

## 2022-03-02 NOTE — Progress Notes (Signed)
? ?Office Visit Note ?  ?Patient: Joseph [name]           ?Date of Birth: July 03, 1971           ?MRN: [id number] ?Visit Date: 03/02/2022 ?             ?Requested by: Marva Panda, NP ?[address] ?[place],  [name] [address] ?PCP: Marva Panda, NP ? ?Chief Complaint  ?Patient presents with  ? Right Knee - Pain  ? ? ? ? ?HPI: ?The patient is a 51 year old gentleman who presents today complaining of right knee pain this has been gradually worsening over the last weeks to months especially since he has had left below-knee amputation and has been relying on his right lower extremity for transfers he is complaining of medial and anterior pain he fears giving way has not had any locking catching or falling.  He has had a knee brace in the past which is offered him the support he felt he needed he states this is currently broken down and his wife is thrown away ? ?Assessment & Plan: ?Visit Diagnoses:  ?1. Acute pain of right knee   ? ? ?Plan: Depo-Medrol injection right knee.  Patient tolerated well.  Knee brace given as well for support with ambulation.  Proceed with gait training once prosthesis obtained. ? ?Follow-Up Instructions: Return in about 4 weeks (around 03/30/2022).  ? ?Right Knee Exam  ? ?Tenderness  ?The patient is experiencing tenderness in the medial joint line, lateral joint line and patellar tendon. ? ?Range of Motion  ?The patient has normal right knee ROM. ? ?Tests  ?Varus: negative Valgus: negative ?Pivot shift: positive ? ?Comments:  Pain with extension ? ? ?Back Exam  ? ?Tenderness  ?The patient is experiencing no tenderness.  ? ?Muscle Strength  ?Right Quadriceps:  4/5  ?Right Hamstrings:  3/5  ? ?Tests  ?Straight leg raise right: positive ?Straight leg raise left: negative ? ? ? ? ?Patient is alert, oriented, no adenopathy, well-dressed, normal affect, normal respiratory effort. ? ? ?Imaging: ?No results found. ?No images are attached to the encounter. ? ?Labs: ?Lab Results   ?Component Value Date  ? HGBA1C 8.4 (H) 01/07/2022  ? HGBA1C 10.0 (H) 11/18/2021  ? HGBA1C 11.8 (H) 08/15/2021  ? ESRSEDRATE 45 (H) 11/18/2021  ? ESRSEDRATE 41 (H) 09/16/2017  ? CRP 12.1 (H) 11/18/2021  ? CRP 8.9 (H) 09/16/2017  ? REPTSTATUS 01/11/2022 FINAL 01/06/2022  ? CULT  01/06/2022  ?  NO GROWTH 5 DAYS ?Performed at [facility], [address], [place], [name] [address] ?  ? LABORGA ENTEROCOCCUS FAECALIS (A) 01/06/2022  ? ? ? ?Lab Results  ?Component Value Date  ? ALBUMIN 2.4 (L) 01/07/2022  ? ALBUMIN 2.4 (L) 01/06/2022  ? ALBUMIN 1.8 (L) 11/25/2021  ? PREALBUMIN 16.5 (L) 01/07/2022  ? ? ?Lab Results  ?Component Value Date  ? MG 1.2 (L) 01/07/2022  ? MG 1.6 (L) 07/17/2021  ? MG 1.5 (L) 07/10/2021  ? ?Lab Results  ?Component Value Date  ? VD25OH 9.40 (L) 01/07/2022  ? ? ?Lab Results  ?Component Value Date  ? PREALBUMIN 16.5 (L) 01/07/2022  ? ?CBC EXTENDED Latest Ref Rng & Units 01/12/2022 01/11/2022 01/10/2022  ?WBC 4.0 - 10.5 K/uL 8.6 7.9 8.1  ?RBC 4.22 - 5.81 MIL/uL 3.62(L) 3.48(L) 3.25(L)  ?HGB 13.0 - 17.0 g/dL 8.6(V) 7.8(I) 8.1(L)  ?HCT 39.0 - 52.0 % 29.6(L) 29.2(L) 26.8(L)  ?PLT 150 - 400 K/uL 294 275 276  ?  NEUTROABS 1.7 - 7.7 K/uL - - -  ?LYMPHSABS 0.7 - 4.0 K/uL - - -  ? ? ? ?There is no height or weight on file to calculate BMI. ? ?Orders:  ?Orders Placed This Encounter  ?Procedures  ? XR Knee 1-2 Views Right  ? ?No orders of the defined types were placed in this encounter. ? ? ? Procedures: ?Large Joint Inj: R knee on 03/03/2022 2:01 PM ?Indications: pain ?Details: 18 G 1.5 in needle, anteromedial approach ?Medications: 5 mL lidocaine 1 %; 40 mg methylPREDNISolone acetate 40 MG/ML ?Consent was given by the patient.  ? ? ? ?Clinical Data: ?No additional findings. ? ?ROS: ? ?All other systems negative, except as noted in the HPI. ?Review of Systems ? ?Objective: ?Vital Signs: There were no vitals taken for this visit. ? ?Specialty Comments:  ?No specialty comments available. ? ?PMFS  History: ?Patient Active Problem List  ? Diagnosis Date Noted  ? Ulcer of right foot, limited to breakdown of skin (HCC)   ? Below-knee amputation of left lower extremity (HCC)   ? Osteomyelitis (HCC) 01/06/2022  ? Bipolar disorder with depression (HCC)   ? Sepsis (HCC) 11/18/2021  ? Chest pain 11/18/2021  ? Dyspnea 11/18/2021  ? Wound infection   ? Ulcer of left foot with necrosis of bone (HCC)   ? Bicuspid aortic valve 09/14/2021  ? Aneurysm of ascending aorta 09/14/2021  ? Hyperkalemia 08/20/2021  ? DM (diabetes mellitus) with peripheral vascular complication (HCC) 08/20/2021  ? Atypical chest pain 07/17/2021  ? Hyponatremia 07/17/2021  ? Leg wound, left 07/17/2021  ? Essential hypertension 07/17/2021  ? Acute hyperglycemia 07/09/2021  ? Left leg cellulitis 04/11/2021  ? Cellulitis of left leg 03/05/2021  ? Cellulitis 02/18/2021  ? PAD (peripheral artery disease) (HCC) 10/10/2020  ? Osteomyelitis of fourth toe of right foot (HCC)   ? S/P CABG x 4 07/24/2020  ? Non-ST elevation (NSTEMI) myocardial infarction (HCC) 07/22/2020  ? Acute on chronic diastolic heart failure (HCC) 07/22/2020  ? Hypotension 07/18/2020  ? Left leg swelling 07/18/2020  ? Elevated troponin 07/18/2020  ? AKI (acute kidney injury) (HCC) 07/18/2020  ? Elevated brain natriuretic peptide (BNP) level 07/18/2020  ? GERD (gastroesophageal reflux disease) 07/18/2020  ? Unstable angina (HCC) 11/01/2019  ? Edema 06/13/2019  ? Leukocytosis 05/31/2019  ? Smoker 03/08/2019  ? Chronic pain 03/08/2019  ? Chronic back pain 02/16/2019  ? Deep venous thrombosis (HCC) 02/16/2019  ? Obesity, Class III, BMI 40-49.9 (morbid obesity) (HCC) 02/16/2019  ? Peripheral nerve disease 02/16/2019  ? COPD (chronic obstructive pulmonary disease) (HCC) 01/22/2019  ? Anxiety disorder 03/26/2018  ? Open wound of toe 10/12/2017  ? Sinusitis 10/12/2017  ? Abdominal pain   ? Loss of weight   ? Diabetic foot infection (HCC) 09/16/2017  ? Uncontrolled type 2 diabetes mellitus with  hyperglycemia, with long-term current use of insulin (HCC) 09/16/2017  ? Cellulitis of right foot   ? Chronic ulcer of great toe of right foot (HCC) 09/09/2017  ? Recurrent chest pain 07/29/2016  ? Hyperglycemia   ? Insulin dependent type 2 diabetes mellitus (HCC) 04/29/2015  ? OSA (obstructive sleep apnea) 05/08/2013  ? History of pulmonary embolus (PE) 04/27/2013  ? CAD -S/P PCI 11/02/19  09/10/2008  ? Hyperlipidemia 09/09/2008  ? ?Past Medical History:  ?Diagnosis Date  ? Aneurysm of ascending aorta 09/14/2021  ? Anxiety   ? Arthritis   ? "knees, shoulders, hips, ankles" (07/29/2016)  ? Asthma   ? Bicuspid aortic  valve 09/14/2021  ? CAD (coronary artery disease)   ? a. 2017: s/p BMS to distal Cx; b. LHC 05/30/2019: 80% mid, distal RCA s/p DES, 30% narrowing of d LM, widely patent LAD w/ luminal irregularities, widely patent stent in dCX  w/ 90+% stenosis distal to stent beofre small trifurcating obtuse marginal (potentially area of restenosis)  ? Chronic bronchitis (HCC)   ? Chronic ulcer of right great toe (HCC) 09/19/2017  ? COPD (chronic obstructive pulmonary disease) (HCC)   ? Depression   ? DVT (deep venous thrombosis) (HCC)   ? GERD (gastroesophageal reflux disease)   ? Hyperlipidemia   ? Hypertension   ? Myocardial infarction [facility]) 1996  ? "light one"  ? PE (pulmonary embolism) 04/2013  ? On chronic Xarelto  ? Peripheral nerve disease   ? Pneumonia "several times"  ? Sleep apnea   ? Type II diabetes mellitus (HCC)   ?  ?Family History  ?Problem Relation Age of Onset  ? Hypertension Brother   ? Hypertension Father   ? Diabetes Other   ? Hyperlipidemia Other   ?  ?Past Surgical History:  ?Procedure Laterality Date  ? ABDOMINAL AORTOGRAM W/LOWER EXTREMITY N/A 10/10/2020  ? Procedure: ABDOMINAL AORTOGRAM W/LOWER EXTREMITY;  Surgeon: Sherren Kerns, MD;  Location: [facility] INVASIVE CV LAB;  Service: Cardiovascular;  Laterality: N/A;  ? AMPUTATION Right 09/21/2017  ? Procedure: RIGHT GREAT TOE AMPUTATION, POSSIBLE VAC;   Surgeon: Tarry Kos, MD;  Location: [facility] OR;  Service: Orthopedics;  Laterality: Right;  ? AMPUTATION Left 08/13/2020  ? Procedure: LEFT FOOT 4TH RAY AMPUTATION;  Surgeon: [name], MD;  Location: [facility] OR;  Service: Alice Rieger

## 2022-03-03 DIAGNOSIS — M25561 Pain in right knee: Secondary | ICD-10-CM | POA: Diagnosis not present

## 2022-03-03 DIAGNOSIS — M1711 Unilateral primary osteoarthritis, right knee: Secondary | ICD-10-CM | POA: Diagnosis not present

## 2022-03-03 MED ORDER — METHYLPREDNISOLONE ACETATE 40 MG/ML IJ SUSP
40.0000 mg | INTRAMUSCULAR | Status: AC | PRN
  Administered 2022-03-03: 40 mg via INTRA_ARTICULAR

## 2022-03-03 MED ORDER — LIDOCAINE HCL 1 % IJ SOLN
5.0000 mL | INTRAMUSCULAR | Status: AC | PRN
  Administered 2022-03-03: 5 mL

## 2022-03-09 ENCOUNTER — Ambulatory Visit (INDEPENDENT_AMBULATORY_CARE_PROVIDER_SITE_OTHER): Payer: Medicare HMO | Admitting: Orthopedic Surgery

## 2022-03-09 ENCOUNTER — Telehealth: Payer: Self-pay | Admitting: Orthopedic Surgery

## 2022-03-09 ENCOUNTER — Other Ambulatory Visit: Payer: Self-pay

## 2022-03-09 DIAGNOSIS — L97411 Non-pressure chronic ulcer of right heel and midfoot limited to breakdown of skin: Secondary | ICD-10-CM

## 2022-03-09 DIAGNOSIS — Z89512 Acquired absence of left leg below knee: Secondary | ICD-10-CM

## 2022-03-09 NOTE — Telephone Encounter (Signed)
Yes this would be absolutely fine to see for both issues on the same day. Thank you!  ?

## 2022-03-09 NOTE — Telephone Encounter (Signed)
Joseph [name] was seen in office today and was instructed to schedule an ROV for 2 weeks with Dr. Sharol Given. He already has an POST OP appt for his left leg amputation in 2 weeks on April 4. Can he been seen for both appts on the 4th? I just wanted to verify this is okay. I will call and schedule a separate appointment if need be. ?

## 2022-03-17 ENCOUNTER — Encounter: Payer: Self-pay | Admitting: Orthopedic Surgery

## 2022-03-17 NOTE — Progress Notes (Signed)
? ?Office Visit Note ?  ?Patient: Joseph [name]           ?Date of Birth: [birth year]           ?MRN: [id number] ?Visit Date: 03/09/2022 ?             ?Requested by: [name], NP ?[address] ?[place],  [place] [id number] ?PCP: [name], NP ? ?Chief Complaint  ?Patient presents with  ? Right Foot - Follow-up  ? Right Knee - Follow-up  ?  S/p injection 03/02/22  ? Left Leg - Routine Post Op  ?  01/08/22 left BKA   ? ? ? ? ?HPI: ?Patient is a 51 year old gentleman who presents for 2 separate issues #1 he is status post a left below-knee amputation proximally 2 months out.  #2 he states he woke up with a large ruptured blister with drainage over the right heel ? ?Assessment & Plan: ?Visit Diagnoses:  ?1. History of left below knee amputation ([place])   ?2. Non-pressure chronic ulcer of right heel and midfoot limited to breakdown of skin ([place])   ? ? ?Plan: Patient is following up with [facility] to get his prosthesis next week.  Will place patient in a PRAFO for the right heel ulcer change dry dressing daily. ? ?Follow-Up Instructions: Return in about 2 weeks (around 03/23/2022).  ? ?Ortho Exam ? ?Patient is alert, oriented, no adenopathy, well-dressed, normal affect, normal respiratory effort. ?Examination the transtibial amputation is well-healed he has a new 7 cm ulcer over the medial right heel Wagner grade 1 ulcer.  Doppler was used and he has a strong biphasic dorsalis pedis and posterior tibial pulse. ? ?Imaging: ?No results found. ? ? ? ?Labs: ?Lab Results  ?Component Value Date  ? HGBA1C 8.4 (H) 01/07/2022  ? HGBA1C 10.0 (H) 11/18/2021  ? HGBA1C 11.8 (H) 08/15/2021  ? ESRSEDRATE 45 (H) 11/18/2021  ? ESRSEDRATE 41 (H) 09/16/2017  ? CRP 12.1 (H) 11/18/2021  ? CRP 8.9 (H) 09/16/2017  ? REPTSTATUS 01/11/2022 FINAL 01/06/2022  ? CULT  01/06/2022  ?  NO GROWTH 5 DAYS ?Performed at [facility], [place] [address], [place], [place] [address] ?  ? LABORGA ENTEROCOCCUS FAECALIS (A) 01/06/2022  ? ? ? ?Lab  Results  ?Component Value Date  ? ALBUMIN 2.4 (L) 01/07/2022  ? ALBUMIN 2.4 (L) 01/06/2022  ? ALBUMIN 1.8 (L) 11/25/2021  ? PREALBUMIN 16.5 (L) 01/07/2022  ? ? ?Lab Results  ?Component Value Date  ? MG 1.2 (L) 01/07/2022  ? MG 1.6 (L) 07/17/2021  ? MG 1.5 (L) 07/10/2021  ? ?Lab Results  ?Component Value Date  ? VD25OH 9.40 (L) 01/07/2022  ? ? ?Lab Results  ?Component Value Date  ? PREALBUMIN 16.5 (L) 01/07/2022  ? ? ?  Latest Ref Rng & Units 01/12/2022  ?  3:32 AM 01/11/2022  ?  5:16 AM 01/10/2022  ?  5:16 AM  ?CBC EXTENDED  ?WBC 4.0 - 10.5 K/uL 8.6   7.9   8.1    ?RBC 4.22 - 5.81 MIL/uL 3.62   3.48   3.25    ?Hemoglobin 13.0 - 17.0 g/dL 8.9   8.6   8.1    ?HCT 39.0 - 52.0 % 29.6   29.2   26.8    ?Platelets 150 - 400 K/uL 294   275   276    ? ? ? ?There is no height or weight on file to calculate BMI. ? ?Orders:  ?No orders  of the defined types were placed in this encounter. ? ?No orders of the defined types were placed in this encounter. ? ? ? Procedures: ?No procedures performed ? ?Clinical Data: ?No additional findings. ? ?ROS: ? ?All other systems negative, except as noted in the HPI. ?Review of Systems ? ?Objective: ?Vital Signs: There were no vitals taken for this visit. ? ?Specialty Comments:  ?No specialty comments available. ? ?PMFS History: ?Patient Active Problem List  ? Diagnosis Date Noted  ? Ulcer of right foot, limited to breakdown of skin ([place])   ? Below-knee amputation of left lower extremity ([place])   ? Osteomyelitis ([place]) 01/06/2022  ? Bipolar disorder with depression ([place])   ? Sepsis ([place]) 11/18/2021  ? Chest pain 11/18/2021  ? Dyspnea 11/18/2021  ? Wound infection   ? Ulcer of left foot with necrosis of bone ([place])   ? Bicuspid aortic valve 09/14/2021  ? Aneurysm of ascending aorta 09/14/2021  ? Hyperkalemia 08/20/2021  ? DM (diabetes mellitus) with peripheral vascular complication ([place]) [id number]  ? Atypical chest pain 07/17/2021  ? Hyponatremia 07/17/2021  ? Leg wound, left 07/17/2021  ? Essential  hypertension 07/17/2021  ? Acute hyperglycemia 07/09/2021  ? Left leg cellulitis 04/11/2021  ? Cellulitis of left leg 03/05/2021  ? Cellulitis 02/18/2021  ? PAD (peripheral artery disease) ([place]) 10/10/2020  ? Osteomyelitis of fourth toe of right foot ([place])   ? S/P CABG x 4 07/24/2020  ? Non-ST elevation (NSTEMI) myocardial infarction ([place]) 07/22/2020  ? Acute on chronic diastolic heart failure ([place]) 07/22/2020  ? Hypotension 07/18/2020  ? Left leg swelling 07/18/2020  ? Elevated troponin 07/18/2020  ? AKI (acute kidney injury) ([place]) 07/18/2020  ? Elevated brain natriuretic peptide (BNP) level 07/18/2020  ? GERD (gastroesophageal reflux disease) 07/18/2020  ? Unstable angina ([place]) 11/01/2019  ? Edema 06/13/2019  ? Leukocytosis 05/31/2019  ? Smoker 03/08/2019  ? Chronic pain 03/08/2019  ? Chronic back pain 02/16/2019  ? Deep venous thrombosis ([place]) 02/16/2019  ? Obesity, Class III, BMI 40-49.9 (morbid obesity) ([place]) 02/16/2019  ? Peripheral nerve disease 02/16/2019  ? COPD (chronic obstructive pulmonary disease) ([place]) 01/22/2019  ? Anxiety disorder 03/26/2018  ? Open wound of toe 10/12/2017  ? Sinusitis 10/12/2017  ? Abdominal pain   ? Loss of weight   ? Diabetic foot infection ([place]) 09/16/2017  ? Uncontrolled type 2 diabetes mellitus with hyperglycemia, with long-term current use of insulin ([place]) 09/16/2017  ? Cellulitis of right foot   ? Chronic ulcer of great toe of right foot ([place]) 09/09/2017  ? Recurrent chest pain 07/29/2016  ? Hyperglycemia   ? Insulin dependent type 2 diabetes mellitus ([place]) 04/29/2015  ? OSA (obstructive sleep apnea) 05/08/2013  ? History of pulmonary embolus (PE) 04/27/2013  ? CAD -S/P PCI 11/02/19  09/10/2008  ? Hyperlipidemia 09/09/2008  ? ?Past Medical History:  ?Diagnosis Date  ? Aneurysm of ascending aorta 09/14/2021  ? Anxiety   ? Arthritis   ? "knees, shoulders, hips, ankles" (07/29/2016)  ? Asthma   ? Bicuspid aortic valve 09/14/2021  ? CAD (coronary artery disease)   ? a. 2017: s/p BMS  to distal Cx; b. LHC 05/30/2019: 80% mid, distal RCA s/p DES, 30% narrowing of d LM, widely patent LAD w/ luminal irregularities, widely patent stent in [place]  w/ 90+% stenosis distal to stent beofre small trifurcating obtuse marginal (potentially area of restenosis)  ? Chronic bronchitis ([place])   ? Chronic ulcer of right great toe ([place]) 09/19/2017  ?  COPD (chronic obstructive pulmonary disease) ([place])   ? Depression   ? DVT (deep venous thrombosis) ([place])   ? GERD (gastroesophageal reflux disease)   ? Hyperlipidemia   ? Hypertension   ? Myocardial infarction [facility]) 1996  ? "light one"  ? PE (pulmonary embolism) 04/2013  ? On chronic Xarelto  ? Peripheral nerve disease   ? Pneumonia "several times"  ? Sleep apnea   ? Type II diabetes mellitus ([place])   ?  ?Family History  ?Problem Relation Age of Onset  ? Hypertension Brother   ? Hypertension Father   ? Diabetes Other   ? Hyperlipidemia Other   ?  ?Past Surgical History:  ?Procedure Laterality Date  ? ABDOMINAL AORTOGRAM W/LOWER EXTREMITY N/A 10/10/2020  ? Procedure: ABDOMINAL AORTOGRAM W/LOWER EXTREMITY;  Surgeon: Elam Dutch, MD;  Location: [facility];  Service: Cardiovascular;  Laterality: N/A;  ? AMPUTATION Right 09/21/2017  ? Procedure: RIGHT GREAT TOE AMPUTATION, POSSIBLE VAC;  Surgeon: Leandrew Koyanagi, MD;  Location: [facility];  Service: Orthopedics;  Laterality: Right;  ? AMPUTATION Left 08/13/2020  ? Procedure: LEFT FOOT 4TH RAY AMPUTATION;  Surgeon: Newt Minion, MD;  Location: [facility];  Service: Orthopedics;  Laterality: Left;  ? AMPUTATION Left 11/20/2021  ? Procedure: LEFT TRANSMETATARSAL AMPUTATION;  Surgeon: Newt Minion, MD;  Location: [facility];  Service: Orthopedics;  Laterality: Left;  ? AMPUTATION Left 01/08/2022  ? Procedure: LEFT BELOW KNEE AMPUTATION;  Surgeon: Newt Minion, MD;  Location: [facility];  Service: Orthopedics;  Laterality: Left;  ? AORTOGRAM Bilateral 03/13/2021  ? Procedure: ABDOMINAL AORTOGRAM WITH Left LOWER EXTREMITY RUNOFF;  Surgeon:  Cherre Robins, MD;  Location: [facility];  Service: Vascular;  Laterality: Bilateral;  ? APPLICATION OF WOUND VAC  01/08/2022  ? Procedure: APPLICATION OF WOUND VAC;  Surgeon: Newt Minion, MD;  Shelly Coss

## 2022-03-23 ENCOUNTER — Ambulatory Visit (INDEPENDENT_AMBULATORY_CARE_PROVIDER_SITE_OTHER): Payer: Medicare HMO | Admitting: Orthopedic Surgery

## 2022-03-23 DIAGNOSIS — L97411 Non-pressure chronic ulcer of right heel and midfoot limited to breakdown of skin: Secondary | ICD-10-CM | POA: Diagnosis not present

## 2022-03-23 DIAGNOSIS — Z89512 Acquired absence of left leg below knee: Secondary | ICD-10-CM | POA: Diagnosis not present

## 2022-03-24 ENCOUNTER — Encounter: Payer: Self-pay | Admitting: Orthopedic Surgery

## 2022-03-24 NOTE — Progress Notes (Signed)
? ?Office Visit Note ?  ?Patient: Joseph [name]           ?Date of Birth: 09-30-1971           ?MRN: [id number] ?Visit Date: 03/23/2022 ?             ?Requested by: Marva PandaMillsaps, Kimberly, NP ?[address] ?[place],  [place] [id number] ?PCP: Marva PandaMillsaps, Kimberly, NP ? ?Chief Complaint  ?Patient presents with  ? Left Leg - Routine Post Op  ?  01/08/22 left BKA  ? Right Foot - Wound Check  ? ? ? ? ?HPI: ?Patient is a 51 year old gentleman who presents for 2 separate issues.  #1 he has a right heel decubitus ulcer currently using a PRAFO and dressing changes.  #2 he has a left transtibial amputation and was casted for prosthesis last week and should have his prosthesis next week. ? ?Assessment & Plan: ?Visit Diagnoses:  ?1. History of left below knee amputation (HCC)   ?2. Non-pressure chronic ulcer of right heel and midfoot limited to breakdown of skin (HCC)   ? ? ?Plan: Recommended continue with Dial soap cleansing to the right heel 4 x 4 and Ace wrap dressing changes daily wear the PRAFO 24 hours a day. ? ?Follow-Up Instructions: Return in about 2 weeks (around 04/06/2022).  ? ?Ortho Exam ? ?Patient is alert, oriented, no adenopathy, well-dressed, normal affect, normal respiratory effort. ?Examination patient has a well-healed residual limb on the left.  Examination the right heel he has a decubitus ulcer that is 3 x 5 cm there is an eschar covering the base there is healthy granulation tissue around the edges and the eschar does seem superficial.  There is also a superficial ulcer dorsally over the ankle. ? ?Imaging: ?No results found. ?No images are attached to the encounter. ? ?Labs: ?Lab Results  ?Component Value Date  ? HGBA1C 8.4 (H) 01/07/2022  ? HGBA1C 10.0 (H) 11/18/2021  ? HGBA1C 11.8 (H) 08/15/2021  ? ESRSEDRATE 45 (H) 11/18/2021  ? ESRSEDRATE 41 (H) 09/16/2017  ? CRP 12.1 (H) 11/18/2021  ? CRP 8.9 (H) 09/16/2017  ? REPTSTATUS 01/11/2022 FINAL 01/06/2022  ? CULT  01/06/2022  ?  NO GROWTH 5 DAYS ?Performed at  [facility], [address] [place], [place] [id number] ?  ? LABORGA ENTEROCOCCUS FAECALIS (A) 01/06/2022  ? ? ? ?Lab Results  ?Component Value Date  ? ALBUMIN 2.4 (L) 01/07/2022  ? ALBUMIN 2.4 (L) 01/06/2022  ? ALBUMIN 1.8 (L) 11/25/2021  ? PREALBUMIN 16.5 (L) 01/07/2022  ? ? ?Lab Results  ?Component Value Date  ? MG 1.2 (L) 01/07/2022  ? MG 1.6 (L) 07/17/2021  ? MG 1.5 (L) 07/10/2021  ? ?Lab Results  ?Component Value Date  ? VD25OH 9.40 (L) 01/07/2022  ? ? ?Lab Results  ?Component Value Date  ? PREALBUMIN 16.5 (L) 01/07/2022  ? ? ?  Latest Ref Rng & Units 01/12/2022  ?  3:32 AM 01/11/2022  ?  5:16 AM 01/10/2022  ?  5:16 AM  ?CBC EXTENDED  ?WBC 4.0 - 10.5 K/uL 8.6   7.9   8.1    ?RBC 4.22 - 5.81 MIL/uL 3.62   3.48   3.25    ?Hemoglobin 13.0 - 17.0 g/dL 8.9   8.6   8.1    ?HCT 39.0 - 52.0 % 29.6   29.2   26.8    ?Platelets 150 - 400 K/uL 294   275   276    ? ? ? ?  There is no height or weight on file to calculate BMI. ? ?Orders:  ?No orders of the defined types were placed in this encounter. ? ?No orders of the defined types were placed in this encounter. ? ? ? Procedures: ?No procedures performed ? ?Clinical Data: ?No additional findings. ? ?ROS: ? ?All other systems negative, except as noted in the HPI. ?Review of Systems ? ?Objective: ?Vital Signs: There were no vitals taken for this visit. ? ?Specialty Comments:  ?No specialty comments available. ? ?PMFS History: ?Patient Active Problem List  ? Diagnosis Date Noted  ? Ulcer of right foot, limited to breakdown of skin (HCC)   ? Below-knee amputation of left lower extremity (HCC)   ? Osteomyelitis (HCC) 01/06/2022  ? Bipolar disorder with depression (HCC)   ? Sepsis (HCC) 11/18/2021  ? Chest pain 11/18/2021  ? Dyspnea 11/18/2021  ? Wound infection   ? Ulcer of left foot with necrosis of bone (HCC)   ? Bicuspid aortic valve 09/14/2021  ? Aneurysm of ascending aorta (HCC) 09/14/2021  ? Hyperkalemia 08/20/2021  ? DM (diabetes mellitus) with peripheral vascular  complication (HCC) 08/20/2021  ? Atypical chest pain 07/17/2021  ? Hyponatremia 07/17/2021  ? Leg wound, left 07/17/2021  ? Essential hypertension 07/17/2021  ? Acute hyperglycemia 07/09/2021  ? Left leg cellulitis 04/11/2021  ? Cellulitis of left leg 03/05/2021  ? Cellulitis 02/18/2021  ? PAD (peripheral artery disease) (HCC) 10/10/2020  ? Osteomyelitis of fourth toe of right foot (HCC)   ? S/P CABG x 4 07/24/2020  ? Non-ST elevation (NSTEMI) myocardial infarction (HCC) 07/22/2020  ? Acute on chronic diastolic heart failure (HCC) 07/22/2020  ? Hypotension 07/18/2020  ? Left leg swelling 07/18/2020  ? Elevated troponin 07/18/2020  ? AKI (acute kidney injury) (HCC) 07/18/2020  ? Elevated brain natriuretic peptide (BNP) level 07/18/2020  ? GERD (gastroesophageal reflux disease) 07/18/2020  ? Unstable angina (HCC) 11/01/2019  ? Edema 06/13/2019  ? Leukocytosis 05/31/2019  ? Smoker 03/08/2019  ? Chronic pain 03/08/2019  ? Chronic back pain 02/16/2019  ? Deep venous thrombosis (HCC) 02/16/2019  ? Obesity, Class III, BMI 40-49.9 (morbid obesity) (HCC) 02/16/2019  ? Peripheral nerve disease 02/16/2019  ? COPD (chronic obstructive pulmonary disease) (HCC) 01/22/2019  ? Anxiety disorder 03/26/2018  ? Open wound of toe 10/12/2017  ? Sinusitis 10/12/2017  ? Abdominal pain   ? Loss of weight   ? Diabetic foot infection (HCC) 09/16/2017  ? Uncontrolled type 2 diabetes mellitus with hyperglycemia, with long-term current use of insulin (HCC) 09/16/2017  ? Cellulitis of right foot   ? Chronic ulcer of great toe of right foot (HCC) 09/09/2017  ? Recurrent chest pain 07/29/2016  ? Hyperglycemia   ? Insulin dependent type 2 diabetes mellitus (HCC) 04/29/2015  ? OSA (obstructive sleep apnea) 05/08/2013  ? History of pulmonary embolus (PE) 04/27/2013  ? CAD -S/P PCI 11/02/19  09/10/2008  ? Hyperlipidemia 09/09/2008  ? ?Past Medical History:  ?Diagnosis Date  ? Aneurysm of ascending aorta (HCC) 09/14/2021  ? Anxiety   ? Arthritis   ?  "knees, shoulders, hips, ankles" (07/29/2016)  ? Asthma   ? Bicuspid aortic valve 09/14/2021  ? CAD (coronary artery disease)   ? a. 2017: s/p BMS to distal Cx; b. LHC 05/30/2019: 80% mid, distal RCA s/p DES, 30% narrowing of d LM, widely patent LAD w/ luminal irregularities, widely patent stent in dCX  w/ 90+% stenosis distal to stent beofre small trifurcating obtuse marginal (potentially area of restenosis)  ?  Chronic bronchitis (HCC)   ? Chronic ulcer of right great toe (HCC) 09/19/2017  ? COPD (chronic obstructive pulmonary disease) (HCC)   ? Depression   ? DVT (deep venous thrombosis) (HCC)   ? GERD (gastroesophageal reflux disease)   ? Hyperlipidemia   ? Hypertension   ? Myocardial infarction [facility]) 1996  ? "light one"  ? PE (pulmonary embolism) 04/2013  ? On chronic Xarelto  ? Peripheral nerve disease   ? Pneumonia "several times"  ? Sleep apnea   ? Type II diabetes mellitus (HCC)   ?  ?Family History  ?Problem Relation Age of Onset  ? Hypertension Brother   ? Hypertension Father   ? Diabetes Other   ? Hyperlipidemia Other   ?  ?Past Surgical History:  ?Procedure Laterality Date  ? ABDOMINAL AORTOGRAM W/LOWER EXTREMITY N/A 10/10/2020  ? Procedure: ABDOMINAL AORTOGRAM W/LOWER EXTREMITY;  Surgeon: Sherren Kerns, MD;  [facility]: [facility];  Service: Cardiovascular;  Laterality: N/A;  ? AMPUTATION Right 09/21/2017  ? Procedure: RIGHT GREAT TOE AMPUTATION, POSSIBLE VAC;  Surgeon: Tarry Kos, MD;  Location: [facility] OR;  Service: Orthopedics;  Laterality: Right;  ? AMPUTATION Left 08/13/2020  ? Procedure: LEFT FOOT 4TH RAY AMPUTATION;  Surgeon: [name], MD;  Location: [facility] OR;  Service: Orthopedics;  Laterality: Left;  ? AMPUTATION Left 11/20/2021  ? Procedure: LEFT TRANSMETATARSAL AMPUTATION;  Surgeon: [name], MD;  [facility];  Service: Orthopedics;  Laterality: Left;  ? AMPUTATION Left 01/08/2022  ? Procedure: LEFT BELOW KNEE AMPUTATION;  Surgeon: [name], MD;  Location: [facility] OR;  Service:  Orthopedics;  Laterality: Left;  ? AORTOGRAM Bilateral 03/13/2021  ? Procedure: ABDOMINAL AORTOGRAM WITH Left LOWER EXTREMITY RUNOFF;  Surgeon: Leonie Douglas, MD;  Location: [facility] OR;  Service: Vascular;  Lat

## 2022-04-06 ENCOUNTER — Encounter: Payer: Medicare HMO | Admitting: Orthopedic Surgery

## 2022-04-13 ENCOUNTER — Inpatient Hospital Stay (HOSPITAL_COMMUNITY)
Admission: EM | Admit: 2022-04-13 | Discharge: 2022-04-16 | DRG: 208 | Disposition: A | Discharge status: Home / Self Care | Payer: Medicare HMO | Attending: Internal Medicine | Admitting: Internal Medicine

## 2022-04-13 ENCOUNTER — Emergency Department (HOSPITAL_COMMUNITY): Payer: Medicare HMO

## 2022-04-13 ENCOUNTER — Encounter (HOSPITAL_COMMUNITY): Payer: Self-pay

## 2022-04-13 ENCOUNTER — Ambulatory Visit (INDEPENDENT_AMBULATORY_CARE_PROVIDER_SITE_OTHER): Payer: Medicare HMO | Admitting: Family

## 2022-04-13 DIAGNOSIS — M549 Dorsalgia, unspecified: Secondary | ICD-10-CM | POA: Diagnosis present

## 2022-04-13 DIAGNOSIS — R402431 Glasgow coma scale score 3-8, in the field [EMT or ambulance]: Secondary | ICD-10-CM

## 2022-04-13 DIAGNOSIS — Z7902 Long term (current) use of antithrombotics/antiplatelets: Secondary | ICD-10-CM

## 2022-04-13 DIAGNOSIS — J329 Chronic sinusitis, unspecified: Secondary | ICD-10-CM | POA: Diagnosis present

## 2022-04-13 DIAGNOSIS — Z882 Allergy status to sulfonamides status: Secondary | ICD-10-CM

## 2022-04-13 DIAGNOSIS — Z794 Long term (current) use of insulin: Secondary | ICD-10-CM

## 2022-04-13 DIAGNOSIS — F17200 Nicotine dependence, unspecified, uncomplicated: Secondary | ICD-10-CM | POA: Diagnosis present

## 2022-04-13 DIAGNOSIS — J440 Chronic obstructive pulmonary disease with acute lower respiratory infection: Secondary | ICD-10-CM | POA: Diagnosis present

## 2022-04-13 DIAGNOSIS — J449 Chronic obstructive pulmonary disease, unspecified: Secondary | ICD-10-CM | POA: Diagnosis present

## 2022-04-13 DIAGNOSIS — I959 Hypotension, unspecified: Secondary | ICD-10-CM | POA: Diagnosis present

## 2022-04-13 DIAGNOSIS — I110 Hypertensive heart disease with heart failure: Secondary | ICD-10-CM | POA: Diagnosis present

## 2022-04-13 DIAGNOSIS — G4733 Obstructive sleep apnea (adult) (pediatric): Secondary | ICD-10-CM | POA: Diagnosis present

## 2022-04-13 DIAGNOSIS — F419 Anxiety disorder, unspecified: Secondary | ICD-10-CM | POA: Diagnosis present

## 2022-04-13 DIAGNOSIS — N179 Acute kidney failure, unspecified: Secondary | ICD-10-CM | POA: Diagnosis present

## 2022-04-13 DIAGNOSIS — R4189 Other symptoms and signs involving cognitive functions and awareness: Principal | ICD-10-CM

## 2022-04-13 DIAGNOSIS — R4781 Slurred speech: Secondary | ICD-10-CM | POA: Diagnosis present

## 2022-04-13 DIAGNOSIS — R4182 Altered mental status, unspecified: Secondary | ICD-10-CM | POA: Diagnosis not present

## 2022-04-13 DIAGNOSIS — I7121 Aneurysm of the ascending aorta, without rupture: Secondary | ICD-10-CM | POA: Diagnosis present

## 2022-04-13 DIAGNOSIS — L89610 Pressure ulcer of right heel, unstageable: Secondary | ICD-10-CM | POA: Diagnosis present

## 2022-04-13 DIAGNOSIS — Z89512 Acquired absence of left leg below knee: Secondary | ICD-10-CM

## 2022-04-13 DIAGNOSIS — Z89411 Acquired absence of right great toe: Secondary | ICD-10-CM

## 2022-04-13 DIAGNOSIS — J189 Pneumonia, unspecified organism: Secondary | ICD-10-CM | POA: Diagnosis not present

## 2022-04-13 DIAGNOSIS — K219 Gastro-esophageal reflux disease without esophagitis: Secondary | ICD-10-CM | POA: Diagnosis present

## 2022-04-13 DIAGNOSIS — Z91199 Patient's noncompliance with other medical treatment and regimen due to unspecified reason: Secondary | ICD-10-CM

## 2022-04-13 DIAGNOSIS — S88112A Complete traumatic amputation at level between knee and ankle, left lower leg, initial encounter: Secondary | ICD-10-CM | POA: Diagnosis present

## 2022-04-13 DIAGNOSIS — I739 Peripheral vascular disease, unspecified: Secondary | ICD-10-CM | POA: Diagnosis present

## 2022-04-13 DIAGNOSIS — F319 Bipolar disorder, unspecified: Secondary | ICD-10-CM | POA: Diagnosis present

## 2022-04-13 DIAGNOSIS — Z8249 Family history of ischemic heart disease and other diseases of the circulatory system: Secondary | ICD-10-CM

## 2022-04-13 DIAGNOSIS — Z86711 Personal history of pulmonary embolism: Secondary | ICD-10-CM | POA: Diagnosis present

## 2022-04-13 DIAGNOSIS — L97411 Non-pressure chronic ulcer of right heel and midfoot limited to breakdown of skin: Secondary | ICD-10-CM

## 2022-04-13 DIAGNOSIS — F32A Depression, unspecified: Secondary | ICD-10-CM | POA: Diagnosis present

## 2022-04-13 DIAGNOSIS — I2510 Atherosclerotic heart disease of native coronary artery without angina pectoris: Secondary | ICD-10-CM

## 2022-04-13 DIAGNOSIS — Z951 Presence of aortocoronary bypass graft: Secondary | ICD-10-CM

## 2022-04-13 DIAGNOSIS — G894 Chronic pain syndrome: Secondary | ICD-10-CM | POA: Diagnosis present

## 2022-04-13 DIAGNOSIS — E119 Type 2 diabetes mellitus without complications: Secondary | ICD-10-CM

## 2022-04-13 DIAGNOSIS — Z7982 Long term (current) use of aspirin: Secondary | ICD-10-CM

## 2022-04-13 DIAGNOSIS — Z8679 Personal history of other diseases of the circulatory system: Secondary | ICD-10-CM

## 2022-04-13 DIAGNOSIS — Z6841 Body Mass Index (BMI) 40.0 and over, adult: Secondary | ICD-10-CM | POA: Diagnosis present

## 2022-04-13 DIAGNOSIS — E1151 Type 2 diabetes mellitus with diabetic peripheral angiopathy without gangrene: Secondary | ICD-10-CM | POA: Diagnosis present

## 2022-04-13 DIAGNOSIS — E785 Hyperlipidemia, unspecified: Secondary | ICD-10-CM | POA: Diagnosis present

## 2022-04-13 DIAGNOSIS — E11621 Type 2 diabetes mellitus with foot ulcer: Secondary | ICD-10-CM | POA: Diagnosis present

## 2022-04-13 DIAGNOSIS — I252 Old myocardial infarction: Secondary | ICD-10-CM

## 2022-04-13 DIAGNOSIS — G4730 Sleep apnea, unspecified: Secondary | ICD-10-CM | POA: Diagnosis present

## 2022-04-13 DIAGNOSIS — J9601 Acute respiratory failure with hypoxia: Secondary | ICD-10-CM

## 2022-04-13 DIAGNOSIS — I5032 Chronic diastolic (congestive) heart failure: Secondary | ICD-10-CM | POA: Diagnosis present

## 2022-04-13 DIAGNOSIS — G9341 Metabolic encephalopathy: Secondary | ICD-10-CM | POA: Diagnosis present

## 2022-04-13 DIAGNOSIS — L97519 Non-pressure chronic ulcer of other part of right foot with unspecified severity: Secondary | ICD-10-CM | POA: Diagnosis present

## 2022-04-13 DIAGNOSIS — J322 Chronic ethmoidal sinusitis: Secondary | ICD-10-CM | POA: Diagnosis present

## 2022-04-13 DIAGNOSIS — Z7984 Long term (current) use of oral hypoglycemic drugs: Secondary | ICD-10-CM

## 2022-04-13 DIAGNOSIS — Z20822 Contact with and (suspected) exposure to covid-19: Secondary | ICD-10-CM | POA: Diagnosis present

## 2022-04-13 DIAGNOSIS — Z833 Family history of diabetes mellitus: Secondary | ICD-10-CM

## 2022-04-13 DIAGNOSIS — Z955 Presence of coronary angioplasty implant and graft: Secondary | ICD-10-CM

## 2022-04-13 DIAGNOSIS — Z7901 Long term (current) use of anticoagulants: Secondary | ICD-10-CM

## 2022-04-13 DIAGNOSIS — Z9641 Presence of insulin pump (external) (internal): Secondary | ICD-10-CM | POA: Diagnosis present

## 2022-04-13 DIAGNOSIS — I10 Essential (primary) hypertension: Secondary | ICD-10-CM | POA: Diagnosis present

## 2022-04-13 DIAGNOSIS — Z79899 Other long term (current) drug therapy: Secondary | ICD-10-CM

## 2022-04-13 DIAGNOSIS — J9602 Acute respiratory failure with hypercapnia: Secondary | ICD-10-CM | POA: Diagnosis present

## 2022-04-13 LAB — URINALYSIS, ROUTINE W REFLEX MICROSCOPIC
Bilirubin Urine: NEGATIVE
Glucose, UA: NEGATIVE mg/dL
Hgb urine dipstick: NEGATIVE
Ketones, ur: NEGATIVE mg/dL
Leukocytes,Ua: NEGATIVE
Nitrite: NEGATIVE
Protein, ur: 100 mg/dL — AB
Specific Gravity, Urine: 1.011 (ref 1.005–1.030)
pH: 5 (ref 5.0–8.0)

## 2022-04-13 LAB — I-STAT VENOUS BLOOD GAS, ED
Acid-Base Excess: 3 mmol/L — ABNORMAL HIGH (ref 0.0–2.0)
Bicarbonate: 28.8 mmol/L — ABNORMAL HIGH (ref 20.0–28.0)
Calcium, Ion: 1.11 mmol/L — ABNORMAL LOW (ref 1.15–1.40)
HCT: 34 % — ABNORMAL LOW (ref 39.0–52.0)
Hemoglobin: 11.6 g/dL — ABNORMAL LOW (ref 13.0–17.0)
O2 Saturation: 87 %
Potassium: 4.6 mmol/L (ref 3.5–5.1)
Sodium: 141 mmol/L (ref 135–145)
TCO2: 30 mmol/L (ref 22–32)
pCO2, Ven: 50 mmHg (ref 44–60)
pH, Ven: 7.368 (ref 7.25–7.43)
pO2, Ven: 56 mmHg — ABNORMAL HIGH (ref 32–45)

## 2022-04-13 LAB — I-STAT ARTERIAL BLOOD GAS, ED
Acid-Base Excess: 2 mmol/L (ref 0.0–2.0)
Acid-Base Excess: 3 mmol/L — ABNORMAL HIGH (ref 0.0–2.0)
Bicarbonate: 28.5 mmol/L — ABNORMAL HIGH (ref 20.0–28.0)
Bicarbonate: 28.7 mmol/L — ABNORMAL HIGH (ref 20.0–28.0)
Calcium, Ion: 1.22 mmol/L (ref 1.15–1.40)
Calcium, Ion: 1.2 mmol/L (ref 1.15–1.40)
HCT: 33 % — ABNORMAL LOW (ref 39.0–52.0)
HCT: 30 % — ABNORMAL LOW (ref 39.0–52.0)
Hemoglobin: 11.2 g/dL — ABNORMAL LOW (ref 13.0–17.0)
Hemoglobin: 10.2 g/dL — ABNORMAL LOW (ref 13.0–17.0)
O2 Saturation: 94 %
O2 Saturation: 100 %
Patient temperature: 97.7
Patient temperature: 98.6
Potassium: 4.6 mmol/L (ref 3.5–5.1)
Potassium: 5 mmol/L (ref 3.5–5.1)
Sodium: 140 mmol/L (ref 135–145)
Sodium: 140 mmol/L (ref 135–145)
TCO2: 30 mmol/L (ref 22–32)
TCO2: 30 mmol/L (ref 22–32)
pCO2 arterial: 51.9 mmHg — ABNORMAL HIGH (ref 32–48)
pCO2 arterial: 49.5 mmHg — ABNORMAL HIGH (ref 32–48)
pH, Arterial: 7.345 — ABNORMAL LOW (ref 7.35–7.45)
pH, Arterial: 7.371 (ref 7.35–7.45)
pO2, Arterial: 73 mmHg — ABNORMAL LOW (ref 83–108)
pO2, Arterial: 278 mmHg — ABNORMAL HIGH (ref 83–108)

## 2022-04-13 LAB — CBC WITH DIFFERENTIAL/PLATELET
Abs Immature Granulocytes: 0.07 10*3/uL (ref 0.00–0.07)
Basophils Absolute: 0.1 10*3/uL (ref 0.0–0.1)
Basophils Relative: 0 %
Eosinophils Absolute: 0.1 10*3/uL (ref 0.0–0.5)
Eosinophils Relative: 1 %
HCT: 36.2 % — ABNORMAL LOW (ref 39.0–52.0)
Hemoglobin: 10.8 g/dL — ABNORMAL LOW (ref 13.0–17.0)
Immature Granulocytes: 1 %
Lymphocytes Relative: 13 %
Lymphs Abs: 1.8 10*3/uL (ref 0.7–4.0)
MCH: 23.6 pg — ABNORMAL LOW (ref 26.0–34.0)
MCHC: 29.8 g/dL — ABNORMAL LOW (ref 30.0–36.0)
MCV: 79 fL — ABNORMAL LOW (ref 80.0–100.0)
Monocytes Absolute: 0.7 10*3/uL (ref 0.1–1.0)
Monocytes Relative: 5 %
Neutro Abs: 11.9 10*3/uL — ABNORMAL HIGH (ref 1.7–7.7)
Neutrophils Relative %: 80 %
Platelets: 334 10*3/uL (ref 150–400)
RBC: 4.58 MIL/uL (ref 4.22–5.81)
RDW: 15.9 % — ABNORMAL HIGH (ref 11.5–15.5)
WBC: 14.6 10*3/uL — ABNORMAL HIGH (ref 4.0–10.5)
nRBC: 0 % (ref 0.0–0.2)

## 2022-04-13 LAB — I-STAT CHEM 8, ED
BUN: 28 mg/dL — ABNORMAL HIGH (ref 6–20)
Calcium, Ion: 1.13 mmol/L — ABNORMAL LOW (ref 1.15–1.40)
Chloride: 104 mmol/L (ref 98–111)
Creatinine, Ser: 1.4 mg/dL — ABNORMAL HIGH (ref 0.61–1.24)
Glucose, Bld: 126 mg/dL — ABNORMAL HIGH (ref 70–99)
HCT: 35 % — ABNORMAL LOW (ref 39.0–52.0)
Hemoglobin: 11.9 g/dL — ABNORMAL LOW (ref 13.0–17.0)
Potassium: 4.6 mmol/L (ref 3.5–5.1)
Sodium: 141 mmol/L (ref 135–145)
TCO2: 25 mmol/L (ref 22–32)

## 2022-04-13 LAB — COMPREHENSIVE METABOLIC PANEL
ALT: 10 U/L (ref 0–44)
AST: 14 U/L — ABNORMAL LOW (ref 15–41)
Albumin: 2.6 g/dL — ABNORMAL LOW (ref 3.5–5.0)
Alkaline Phosphatase: 78 U/L (ref 38–126)
Anion gap: 8 (ref 5–15)
BUN: 26 mg/dL — ABNORMAL HIGH (ref 6–20)
CO2: 25 mmol/L (ref 22–32)
Calcium: 8.7 mg/dL — ABNORMAL LOW (ref 8.9–10.3)
Chloride: 107 mmol/L (ref 98–111)
Creatinine, Ser: 1.42 mg/dL — ABNORMAL HIGH (ref 0.61–1.24)
GFR, Estimated: >60 mL/min (ref >60)
Glucose, Bld: 123 mg/dL — ABNORMAL HIGH (ref 70–99)
Potassium: 4.6 mmol/L (ref 3.5–5.1)
Sodium: 140 mmol/L (ref 135–145)
Total Bilirubin: 0.6 mg/dL (ref 0.3–1.2)
Total Protein: 6.9 g/dL (ref 6.5–8.1)

## 2022-04-13 LAB — TROPONIN I (HIGH SENSITIVITY): Troponin I (High Sensitivity): 8 ng/L (ref <18)

## 2022-04-13 LAB — ACETAMINOPHEN LEVEL: Acetaminophen (Tylenol), Serum: <10 ug/mL — ABNORMAL LOW (ref 10–30)

## 2022-04-13 LAB — LACTIC ACID, PLASMA: Lactic Acid, Venous: 1.6 mmol/L (ref 0.5–1.9)

## 2022-04-13 LAB — D-DIMER, QUANTITATIVE: D-Dimer, Quant: 0.7 ug/mL-FEU — ABNORMAL HIGH (ref 0.00–0.50)

## 2022-04-13 LAB — CBG MONITORING, ED: Glucose-Capillary: 118 mg/dL — ABNORMAL HIGH (ref 70–99)

## 2022-04-13 LAB — RAPID URINE DRUG SCREEN, HOSP PERFORMED
Amphetamines: NOT DETECTED
Barbiturates: NOT DETECTED
Benzodiazepines: POSITIVE — AB
Cocaine: NOT DETECTED
Opiates: NOT DETECTED
Tetrahydrocannabinol: POSITIVE — AB

## 2022-04-13 LAB — SALICYLATE LEVEL: Salicylate Lvl: <7 mg/dL — ABNORMAL LOW (ref 7.0–30.0)

## 2022-04-13 LAB — PROTIME-INR
INR: 1.1 (ref 0.8–1.2)
Prothrombin Time: 13.7 seconds (ref 11.4–15.2)

## 2022-04-13 LAB — MAGNESIUM: Magnesium: 1.2 mg/dL — ABNORMAL LOW (ref 1.7–2.4)

## 2022-04-13 LAB — ETHANOL: Alcohol, Ethyl (B): <10 mg/dL (ref <10)

## 2022-04-13 LAB — AMMONIA: Ammonia: 26 umol/L (ref 9–35)

## 2022-04-13 MED ORDER — ROCURONIUM BROMIDE 50 MG/5ML IV SOLN
INTRAVENOUS | Status: AC | PRN
  Administered 2022-04-13: 100 mg via INTRAVENOUS

## 2022-04-13 MED ORDER — FENTANYL 2500MCG IN NS 250ML (10MCG/ML) PREMIX INFUSION
50.0000 ug/h | INTRAVENOUS | Status: DC
  Administered 2022-04-13: 50 ug/h via INTRAVENOUS
  Administered 2022-04-14: 100 ug/h via INTRAVENOUS
  Filled 2022-04-13 (×2): qty 250

## 2022-04-13 MED ORDER — SODIUM CHLORIDE 0.9 % IV SOLN
500.0000 mg | INTRAVENOUS | Status: DC
  Administered 2022-04-14: 500 mg via INTRAVENOUS
  Filled 2022-04-13: qty 5

## 2022-04-13 MED ORDER — ONDANSETRON HCL 4 MG/2ML IJ SOLN
INTRAMUSCULAR | Status: AC
  Administered 2022-04-13: 4 mg
  Filled 2022-04-13: qty 2

## 2022-04-13 MED ORDER — SODIUM CHLORIDE 0.9 % IV SOLN
1.0000 g | INTRAVENOUS | Status: DC
  Administered 2022-04-14: 1 g via INTRAVENOUS
  Filled 2022-04-13: qty 10

## 2022-04-13 MED ORDER — NALOXONE HCL 2 MG/2ML IJ SOSY
PREFILLED_SYRINGE | INTRAMUSCULAR | Status: AC
  Administered 2022-04-13: 2 mg
  Filled 2022-04-13: qty 2

## 2022-04-13 MED ORDER — IOHEXOL 350 MG/ML SOLN
150.0000 mL | Freq: Once | INTRAVENOUS | Status: AC | PRN
  Administered 2022-04-13: 150 mL via INTRAVENOUS

## 2022-04-13 MED ORDER — NOREPINEPHRINE 4 MG/250ML-% IV SOLN
0.0000 ug/min | INTRAVENOUS | Status: DC
  Administered 2022-04-14: 2 ug/min via INTRAVENOUS
  Filled 2022-04-13: qty 250

## 2022-04-13 MED ORDER — LACTATED RINGERS IV BOLUS
1000.0000 mL | Freq: Once | INTRAVENOUS | Status: AC
  Administered 2022-04-13: 1000 mL via INTRAVENOUS

## 2022-04-13 MED ORDER — MIDAZOLAM-SODIUM CHLORIDE 100-0.9 MG/100ML-% IV SOLN
0.5000 mg/h | INTRAVENOUS | Status: DC
  Administered 2022-04-13: 0.5 mg/h via INTRAVENOUS
  Filled 2022-04-13: qty 100

## 2022-04-13 MED ORDER — LACTATED RINGERS IV SOLN: INTRAVENOUS | Status: DC

## 2022-04-13 MED ORDER — MAGNESIUM SULFATE 2 GM/50ML IV SOLN
2.0000 g | Freq: Once | INTRAVENOUS | Status: AC
  Administered 2022-04-13: 2 g via INTRAVENOUS
  Filled 2022-04-13: qty 50

## 2022-04-13 MED ORDER — PROPOFOL 1000 MG/100ML IV EMUL
5.0000 ug/kg/min | INTRAVENOUS | Status: DC
  Administered 2022-04-13: 5 ug/kg/min via INTRAVENOUS
  Filled 2022-04-13: qty 100

## 2022-04-13 MED ORDER — FENTANYL BOLUS VIA INFUSION
50.0000 ug | INTRAVENOUS | Status: DC | PRN
  Filled 2022-04-13: qty 100

## 2022-04-13 MED ORDER — ETOMIDATE 2 MG/ML IV SOLN
INTRAVENOUS | Status: AC | PRN
  Administered 2022-04-13: 30 mg via INTRAVENOUS

## 2022-04-13 NOTE — H&P (Addendum)
? ?NAME:  Joseph [name], MRN:  [id number], DOB:  1971/06/17, LOS: 0 ?ADMISSION DATE:  04/13/2022, CONSULTATION DATE: 04/13/22 ?REFERRING MD:  Doren Custard, CHIEF COMPLAINT:  AMS   ? ?History of Present Illness:  ?51 yo man with a history of CAD, MI, DM, here with AMS.  ? ?Sudden slurred speech at 330, felt like sugar was dropping, concern for "beeping" insulin pump, then he lost consciousness, vomiting.  Consciousness waxed and waned.  ?Decreased appetite for several days, increased sleeping.  Had some chest pain and sob last week, but none since.  ?Per notes he was responsive and oriented at 646 pm (neuro eval, after intubation).  ABG does not reveal significant changes after intubation.   ?Per notes BS normal by EMS, blood glucose in ED 118. ?On arrival to ED only withdrawing to pain but otherwise unresponsive.   Opened eyes with 4 of narcan (5pm).  Insufficient respirations and low oxygen saturation.   ?Intubated.  ?Mild hypercarbia (51-->49 after intubation) ? ? ?Pertinent  Medical History  ? ?Chronic ulcer of R heel and midfoot ?DM ?HTN  ?HLD ?CAD, hx of MI ?PE hx, AC stopped after CABG (?) ?Obesity with BMI 42 ?Obstructive Sleep apnea ?PVD s/p R toe amputation ?L BKA ?Ascending aortic aneurysm  ?COPD  ?Anxiety/depression ?CHF grade 2 diastolic  ?Significant Hospital Events: ?Including procedures, antibiotic start and stop dates in addition to other pertinent events   ? ? ?Interim History / Subjective:  ? ? ?Objective   ?Blood pressure (!) 86/70, pulse 77, temperature (!) 97.1 ?F (36.2 ?C), resp. rate 16, height 6\' 5"  (1.956 m), weight (!) 162 kg, SpO2 99 %. ?   ?Vent Mode: PRVC ?FiO2 (%):  [100 %] 100 % ?Set Rate:  [16 bmp] 16 bmp ?Vt Set:  [620 mL-700 mL] 620 mL ?PEEP:  [5 cmH20] 5 cmH20 ?Plateau Pressure:  [20 cmH20] 20 cmH20  ? ?Intake/Output Summary (Last 24 hours) at 04/13/2022 2304 ?Last data filed at 04/13/2022 2133 ?Gross per 24 hour  ?Intake 24.06 ml  ?Output 1000 ml  ?Net -975.94 ml  ? ?Filed Weights  ?  04/13/22 1738  ?Weight: (!) 162 kg  ? ? ?Examination: ?General: NAD, intubated  ?HENT: NCAT, pupils pinpoint ?Lungs: CTAB  ?Cardiovascular: RRR no mgr  ?Abdomen: obese, NT NBS ?Extremities: black heel wound on RLE, LLE amputation ?Neuro: sedated  ?GU: foley  ? ?Resolved Hospital Problem list   ? ? ?Assessment & Plan:  ?Acute encephalopathy:  ?Unclear cause. Appears to have resolved significantly after intubation, however, he was not terribly hypercarbic and hypercarbia was generally unchanged at the point when he was more alert.  CT head negative.  ?Utox pos for benzos (takes xanax) and THC.   ?Per wife he is not at risk for abusing drugs or intentional or accidental overdose.   ?Possibly secondary to infection?  Check procalcitonin.  Leukocytosis and low temp noted.  Atelectaisis vs consolidation on CT chest.  Checking for covid/ flu ?Follow neuro recs: CTA head and neck, MRI.  ?EEG if all neg.   ?Cont mech vent for tonight.  SBT in am.  ? ?Mild hypotension: possibly 2/2 medications.   ?Try to titrate down on sedation. Precedex if needed.   ? ?Mild AKI 1.4 from bl 1.1 -  monitor.  ? ?Hypoalb 2.6 ?Mg 1.2 - given Mg in ed.  ? ?Chronic ulcer of R heel and midfoot ?DM - ISS  ?HTN - held meds  ?HLD - statin ?CAD, hx of MI -  asa and plavix.  ?PE hx, AC stopped after CABG (?) - CTA chest neg.  ?Obesity with BMI 42 ?Obstructive Sleep apnea - supposed to use CPAP,  machine broke in 12/22 ?PVD s/p R toe amputation ?L BKA ?Ascending aortic aneurysm  ?COPD  ? ?Best Practice (right click and "Reselect all SmartList Selections" daily)  ? ?Diet/type: NPO ?DVT prophylaxis: prophylactic heparin  ?GI prophylaxis: PPI ?Lines: N/A ?Foley:  N/A ?Code Status:  full code ?Last date of multidisciplinary goals of care discussion []  ? ?Labs   ?CBC: ?Recent Labs  ?Lab 04/13/22 ?1714 04/13/22 ?1731 04/13/22 ?1741 04/13/22 ?1951  ?WBC 14.6*  --   --   --   ?NEUTROABS 11.9*  --   --   --   ?HGB 10.8* 11.2* 11.9*  11.6* 10.2*  ?HCT 36.2* 33.0*  35.0*  34.0* 30.0*  ?MCV 79.0*  --   --   --   ?PLT 334  --   --   --   ? ? ?Basic Metabolic Panel: ?Recent Labs  ?Lab 04/13/22 ?1714 04/13/22 ?1731 04/13/22 ?1741 04/13/22 ?1951  ?NA 140 140 141  141 140  ?K 4.6 4.6 4.6  4.6 5.0  ?CL 107  --  104  --   ?CO2 25  --   --   --   ?GLUCOSE 123*  --  126*  --   ?BUN 26*  --  28*  --   ?CREATININE 1.42*  --  1.40*  --   ?CALCIUM 8.7*  --   --   --   ?MG 1.2*  --   --   --   ? ?GFR: ?Estimated Creatinine Clearance: 106.8 mL/min (A) (by C-G formula based on SCr of 1.4 mg/dL (H)). ?Recent Labs  ?Lab 04/13/22 ?1714 04/13/22 ?1726  ?WBC 14.6*  --   ?LATICACIDVEN  --  1.6  ? ? ?Liver Function Tests: ?Recent Labs  ?Lab 04/13/22 ?1714  ?AST 14*  ?ALT 10  ?ALKPHOS 78  ?BILITOT 0.6  ?PROT 6.9  ?ALBUMIN 2.6*  ? ?No results for input(s): LIPASE, AMYLASE in the last 168 hours. ?Recent Labs  ?Lab 04/13/22 ?1726  ?AMMONIA 26  ? ? ?ABG ?   ?Component Value Date/Time  ? PHART 7.371 04/13/2022 1951  ? PCO2ART 49.5 (H) 04/13/2022 1951  ? PO2ART 278 (H) 04/13/2022 1951  ? HCO3 28.7 (H) 04/13/2022 1951  ? TCO2 30 04/13/2022 1951  ? ACIDBASEDEF 2.0 11/23/2021 0750  ? O2SAT 100 04/13/2022 1951  ?  ? ?Coagulation Profile: ?Recent Labs  ?Lab 04/13/22 ?1714  ?INR 1.1  ? ? ?Cardiac Enzymes: ?No results for input(s): CKTOTAL, CKMB, CKMBINDEX, TROPONINI in the last 168 hours. ? ?HbA1C: ?Hgb A1c MFr Bld  ?Date/Time Value Ref Range Status  ?01/07/2022 03:15 PM 8.4 (H) 4.8 - 5.6 % Final  ?  Comment:  ?  (NOTE) ?        Prediabetes: 5.7 - 6.4 ?        Diabetes: >6.4 ?        Glycemic control for adults with diabetes: <7.0 ?  ?11/18/2021 05:25 AM 10.0 (H) 4.8 - 5.6 % Final  ?  Comment:  ?  (NOTE) ?        Prediabetes: 5.7 - 6.4 ?        Diabetes: >6.4 ?        Glycemic control for adults with diabetes: <7.0 ?  ? ? ?CBG: ?Recent Labs  ?Lab 04/13/22 ?1704  ?GLUCAP 118*  ? ? ?  Review of Systems:   ?Unable  ? ?Past Medical History:  ?He,  has a past medical history of Aneurysm of ascending aorta ([place])  (09/14/2021), Anxiety, Arthritis, Asthma, Bicuspid aortic valve (09/14/2021), CAD (coronary artery disease), Chronic bronchitis ([place]), Chronic ulcer of right great toe ([place]) (09/19/2017), COPD (chronic obstructive pulmonary disease) ([place]), Depression, DVT (deep venous thrombosis) ([place]), GERD (gastroesophageal reflux disease), Hyperlipidemia, Hypertension, Myocardial infarction ([place]) (1996), PE (pulmonary embolism) (04/2013), Peripheral nerve disease, Pneumonia ("several times"), Sleep apnea, and Type II diabetes mellitus ([place]).  ? ?Surgical History:  ? ?Past Surgical History:  ?Procedure Laterality Date  ? ABDOMINAL AORTOGRAM W/LOWER EXTREMITY N/A 10/10/2020  ? Procedure: ABDOMINAL AORTOGRAM W/LOWER EXTREMITY;  Surgeon: Elam Dutch, MD;  Location: [facility];  Service: Cardiovascular;  Laterality: N/A;  ? AMPUTATION Right 09/21/2017  ? Procedure: RIGHT GREAT TOE AMPUTATION, POSSIBLE VAC;  Surgeon: Leandrew Koyanagi, MD;  Location: [facility];  Service: Orthopedics;  Laterality: Right;  ? AMPUTATION Left 08/13/2020  ? Procedure: LEFT FOOT 4TH RAY AMPUTATION;  Surgeon: Newt Minion, MD;  Location: [facility];  Service: Orthopedics;  Laterality: Left;  ? AMPUTATION Left 11/20/2021  ? Procedure: LEFT TRANSMETATARSAL AMPUTATION;  Surgeon: Newt Minion, MD;  Location: [place];  Service: Orthopedics;  Laterality: Left;  ? AMPUTATION Left 01/08/2022  ? Procedure: LEFT BELOW KNEE AMPUTATION;  Surgeon: Newt Minion, MD;  Location: [place];  Service: Orthopedics;  Laterality: Left;  ? AORTOGRAM Bilateral 03/13/2021  ? Procedure: ABDOMINAL AORTOGRAM WITH Left LOWER EXTREMITY RUNOFF;  Surgeon: Cherre Robins, MD;  Location: [facility];  Service: Vascular;  Laterality: Bilateral;  ? APPLICATION OF WOUND VAC  01/08/2022  ? Procedure: APPLICATION OF WOUND VAC;  Surgeon: Newt Minion, MD;  Location: [facility];  Service: Orthopedics;;  ? CARDIAC CATHETERIZATION  2006  ? CARDIAC CATHETERIZATION  1996  ? "@ [facility]; when I had my heart attack"  ?  CARDIAC CATHETERIZATION N/A 07/29/2016  ? Procedure: Left Heart Cath and Coronary Angiography;  Surgeon: Lorretta Harp, MD;  Location: [facility];  Service: Cardiovascular;  Laterality: N/A;  ? CARDIAC CATHE

## 2022-04-13 NOTE — ED Notes (Signed)
CT contacted, pt to be transported to bay 2 at 2045 ?

## 2022-04-13 NOTE — Consult Note (Signed)
Neurology Consultation ?Reason for Consult: Altered mental status ?Requesting Physician: Godfrey Pick ? ?CC: Slurred speech and waxing/waning consciousness ? ?History is obtained from: Wife, chart review and EDP ? ?HPI: Joseph [name] is a 51 y.o. male with past medical history significant for diabetes, hypertension, hyperlipidemia, CAD with prior MI, PE (previously on chronic Xarelto), obesity (BMI 42.35), sleep apnea, peripheral vascular disease s/p right great toe amputation and left BKA, aneurysm of the ascending aorta, COPD, anxiety/depression ? ?Wife reports that patient had been in his normal state of health, recently celebrated his 68th birthday on 4/[birth year], and they were talking at 3:30 PM.  His speech began to get slurred and he told her that he felt like his sugar was dropping.  They feel like his insulin pump was beeping.  He dropped his phone and was slumping over towards the left.  He then began vomiting liquid and gagging.  He was also seeming to go in and out of consciousness, but when reasonably alert he was oriented, seemed understand his wife was calling EMS and stated for example "hospital".  Wife additionally noted that the patient has been eating and drinking less in the last few days, which she attributed to depression secondary to his right heel ulcer.  She noted that he would be awake only for 4 hours in the morning, then take a long nap and be awake for 4 hours in the evening, otherwise spending 16 hours a day in bed.  She denies any signs or symptoms of infection.  He did not have any shaking, bowel or bladder incontinence or tongue bite.  She denies any recent changes in medications. ? ?On specific questioning, she reports that his Xarelto which was started for unprovoked multifocal PEs (incidentally discovered due to EKG abnormalities), was stopped after his CABG, possibly due to him being on clopidogrel and aspirin ? ?On EMS arrival blood glucose was normal without intervention.  Blood  pressures were [id number] systolic.  He was withdrawing to pain but otherwise unresponsive on ED arrival, and therefore was emergently intubated.  He did receive 4 of Narcan with which he opened his eyes.  He was taking shallow breaths and his oxygen was 88% initially. ? ? ?LKW: 3 PM ?tPA given?: No, nonfocal exam on neurological evaluation ?IA performed?: No, or if yes, groin puncture time:  ?Premorbid modified rankin scale: 3-4 ?    3 - Moderate disability. Requires some help, but able to walk unassisted. ?    4 - Moderately severe disability. Unable to attend to own bodily needs without assistance, and unable to walk unassisted. ? ?ROS: Limited secondary to ET tube ? ?Past Medical History:  ?Diagnosis Date  ? Aneurysm of ascending aorta ([place]) 09/14/2021  ? Anxiety   ? Arthritis   ? "knees, shoulders, hips, ankles" (07/29/2016)  ? Asthma   ? Bicuspid aortic valve 09/14/2021  ? CAD (coronary artery disease)   ? a. 2017: s/p BMS to distal Cx; b. LHC 05/30/2019: 80% mid, distal RCA s/p DES, 30% narrowing of d LM, widely patent LAD w/ luminal irregularities, widely patent stent in [place]  w/ 90+% stenosis distal to stent beofre small trifurcating obtuse marginal (potentially area of restenosis)  ? Chronic bronchitis ([place])   ? Chronic ulcer of right great toe ([place]) 09/19/2017  ? COPD (chronic obstructive pulmonary disease) ([place])   ? Depression   ? DVT (deep venous thrombosis) ([place])   ? GERD (gastroesophageal reflux disease)   ? Hyperlipidemia   ? Hypertension   ?  Myocardial infarction [facility]) 1996  ? "light one"  ? PE (pulmonary embolism) 04/2013  ? On chronic Xarelto  ? Peripheral nerve disease   ? Pneumonia "several times"  ? Sleep apnea   ? Type II diabetes mellitus ([place])   ? ?Past Surgical History:  ?Procedure Laterality Date  ? ABDOMINAL AORTOGRAM W/LOWER EXTREMITY N/A 10/10/2020  ? Procedure: ABDOMINAL AORTOGRAM W/LOWER EXTREMITY;  Surgeon: Elam Dutch, MD;  Location: [facility];  Service: Cardiovascular;   Laterality: N/A;  ? AMPUTATION Right 09/21/2017  ? Procedure: RIGHT GREAT TOE AMPUTATION, POSSIBLE VAC;  Surgeon: Leandrew Koyanagi, MD;  Location: [place];  Service: Orthopedics;  Laterality: Right;  ? AMPUTATION Left 08/13/2020  ? Procedure: LEFT FOOT 4TH RAY AMPUTATION;  Surgeon: Newt Minion, MD;  Location: [place];  Service: Orthopedics;  Laterality: Left;  ? AMPUTATION Left 11/20/2021  ? Procedure: LEFT TRANSMETATARSAL AMPUTATION;  Surgeon: Newt Minion, MD;  Location: [place];  Service: Orthopedics;  Laterality: Left;  ? AMPUTATION Left 01/08/2022  ? Procedure: LEFT BELOW KNEE AMPUTATION;  Surgeon: Newt Minion, MD;  Location: [facility];  Service: Orthopedics;  Laterality: Left;  ? AORTOGRAM Bilateral 03/13/2021  ? Procedure: ABDOMINAL AORTOGRAM WITH Left LOWER EXTREMITY RUNOFF;  Surgeon: Cherre Robins, MD;  Location: [place];  Service: Vascular;  Laterality: Bilateral;  ? APPLICATION OF WOUND VAC  01/08/2022  ? Procedure: APPLICATION OF WOUND VAC;  Surgeon: Newt Minion, MD;  Location: [facility];  Service: Orthopedics;;  ? CARDIAC CATHETERIZATION  2006  ? CARDIAC CATHETERIZATION  1996  ? "@ [facility]; when I had my heart attack"  ? CARDIAC CATHETERIZATION N/A 07/29/2016  ? Procedure: Left Heart Cath and Coronary Angiography;  Surgeon: Lorretta Harp, MD;  Location: [facility];  Service: Cardiovascular;  Laterality: N/A;  ? CARDIAC CATHETERIZATION N/A 07/29/2016  ? Procedure: Coronary Stent Intervention;  Surgeon: Lorretta Harp, MD;  Location: [facility];  Service: Cardiovascular;  Laterality: N/A;  ? CARPAL TUNNEL RELEASE Bilateral   ? CORONARY ANGIOPLASTY WITH STENT PLACEMENT  07/29/2016  ? CORONARY ARTERY BYPASS GRAFT N/A 07/24/2020  ? Procedure: CORONARY ARTERY BYPASS GRAFTING (CABG), ON PUMP, TIMES FOUR, USING LEFT INTERNAL MAMMARY ARTERY AND ENDOSCOPICALLY HARVESTED RIGHT GREATER SAPHENOUS VEIN;  Surgeon: Lajuana Matte, MD;  Location: [facility];  Service: Open Heart Surgery;  Laterality: N/A;  FLOW TAC   ? CORONARY STENT INTERVENTION N/A 05/30/2019  ? Procedure: CORONARY STENT INTERVENTION;  Surgeon: Belva Crome, MD;  Location: [place] [facility];  Service: Cardiovascular;  Laterality: N/A;  ? CORONARY STENT INTERVENTION N/A 11/02/2019  ? Procedure: CORONARY STENT INTERVENTION;  Surgeon: Wellington Hampshire, MD;  Location: [place] [facility];  Service: Cardiovascular;  Laterality: N/A;  ? ESOPHAGOGASTRODUODENOSCOPY N/A 09/22/2017  ? Procedure: ESOPHAGOGASTRODUODENOSCOPY (EGD);  Surgeon: Ladene Artist, MD;  Location: [facility];  Service: Endoscopy;  Laterality: N/A;  ? INTRAVASCULAR PRESSURE WIRE/FFR STUDY N/A 07/22/2020  ? Procedure: INTRAVASCULAR PRESSURE WIRE/FFR STUDY;  Surgeon: Leonie Man, MD;  Location: [place] [facility];  Service: Cardiovascular;  Laterality: N/A;  ? KNEE ARTHROSCOPY Bilateral   ? "2 on left; 1 on the right"  ? LEFT HEART CATH AND CORONARY ANGIOGRAPHY N/A 11/02/2019  ? Procedure: LEFT HEART CATH AND CORONARY ANGIOGRAPHY;  Surgeon: Wellington Hampshire, MD;  Location: [facility] [facility];  Service: Cardiovascular;  Laterality: N/A;  ? LEFT HEART CATH AND CORONARY ANGIOGRAPHY N/A 07/22/2020  ? Procedure:  LEFT HEART CATH AND CORONARY ANGIOGRAPHY;  Surgeon: Leonie Man, MD;  Location: [place] [facility];  Service: Cardiovascular;  Laterality: N/A;  ? LEFT HEART CATH AND CORS/GRAFTS ANGIOGRAPHY N/A 08/17/2021  ? Procedure: LEFT HEART CATH AND CORS/GRAFTS ANGIOGRAPHY;  Surgeon: Nelva Bush, MD;  Location: [place] [facility];  Service: Cardiovascular;  Laterality: N/A;  ? PERIPHERAL VASCULAR INTERVENTION Left 10/11/2020  ? popliteal and SFA stent placement   ? PERIPHERAL VASCULAR INTERVENTION Left 10/10/2020  ? Procedure: PERIPHERAL VASCULAR INTERVENTION;  Surgeon: Elam Dutch, MD;  Location: [place] [facility];  Service: Cardiovascular;  Laterality: Left;  ? RIGHT/LEFT HEART CATH AND CORONARY ANGIOGRAPHY N/A 05/30/2019  ? Procedure: RIGHT/LEFT HEART CATH AND CORONARY ANGIOGRAPHY;   Surgeon: Belva Crome, MD;  Location: [place] [facility];  Service: Cardiovascular;  Laterality: N/A;  ? SHOULDER OPEN ROTATOR CUFF REPAIR Bilateral   ? TEE WITHOUT CARDIOVERSION N/A 07/24/2020  ? Procedure: TRA

## 2022-04-13 NOTE — ED Notes (Signed)
Pt returned from CT °

## 2022-04-13 NOTE — ED Provider Notes (Signed)
?[facility] ?Provider Note ? ? ?CSN: [id number] ?Arrival date & time: 04/13/22  1653 ? ?  ? ?History ? ?Chief Complaint  ?Patient presents with  ? Unresponsive  ? ? ?Joseph [name] is a 51 y.o. male. ? ?HPI ?Patient presents for unresponsiveness.  His medical history includes CAD, history of PE, HLD, OSA, insulin-dependent diabetes, chronic ulcer of right foot, chronic back pain, COPD, DVT, obesity, GERD, PAD, HTN, and LLE BKA.  Per his wife, who he lives with, he was in his normal state of health 1.5 hours prior to arrival.  He was subsequently found to be unresponsive.  EMS was called.  EMS reports GCS of 10 with patient responding to pain.  They did place him on supplemental oxygen following SPO2 on room air of 90%.  They checked his blood glucose and found to be normal.  They did not provide any other interventions.  He did have 1 episode of small-volume emesis prior to arrival.  Patient's home medications include Wellbutrin, insulin, Victoza, Claritin, Lyrica, and oxycodone.  EMS did not note pinpoint pupils on exam prior to arrival.  They did not trial any Narcan. ? ?History per wife: Patient was having a conversation with her when he had difficulty with speech and then became unresponsive.  She is not aware of any medications he took prior to this episode. ?  ? ?Home Medications ?Prior to Admission medications   ?Medication Sig Start Date End Date Taking? Authorizing Provider  ?albuterol (VENTOLIN HFA) 108 (90 Base) MCG/ACT inhaler Inhale 2 puffs into the lungs every 6 (six) hours as needed for wheezing or shortness of breath.   Yes [provider]  ?ALPRAZolam Prudy Feeler(XANAX) 1 MG tablet Take 1 mg by mouth daily as needed for anxiety. 03/24/22  Yes [provider]  ?aspirin EC 81 MG tablet Take 81 mg by mouth daily. Swallow whole.   Yes [provider]  ?atorvastatin (LIPITOR) 80 MG tablet Take 1 tablet (80 mg total) by mouth daily at 6 PM. 05/31/19  Yes  Marjie SkiffGoodrich, Callie E, PA-C  ?buPROPion (WELLBUTRIN SR) 150 MG 12 hr tablet Take 1 tablet (150 mg total) by mouth 2 (two) times daily. 11/25/21  Yes Rhetta MuraSamtani, Jai-Gurmukh, MD  ?carvedilol (COREG) 3.125 MG tablet Take 3.125 mg by mouth 2 (two) times daily. 02/22/22  Yes [provider]  ?clopidogrel (PLAVIX) 75 MG tablet Take 1 tablet (75 mg total) by mouth daily with breakfast. 10/11/20  Yes Lars Mageollins, Emma M, PA-C  ?escitalopram (LEXAPRO) 10 MG tablet Take 10 mg by mouth daily. 01/27/22  Yes [provider]  ?fenofibrate (TRICOR) 145 MG tablet Take 145 mg by mouth daily. 02/22/22  Yes [provider]  ?fluconazole (DIFLUCAN) 150 MG tablet Take 150 mg by mouth once a week. 03/08/22  Yes [provider]  ?fluticasone (FLONASE) 50 MCG/ACT nasal spray Place 2 sprays into both nostrils daily.   Yes [provider]  ?furosemide (LASIX) 40 MG tablet Take 40 mg by mouth daily. 02/05/22  Yes [provider]  ?icosapent Ethyl (VASCEPA) 1 g capsule Take 2 capsules (2 g total) by mouth 2 (two) times daily. 09/02/21  Yes Chilton Siandolph, Tiffany, MD  ?insulin aspart (NOVOLOG) 100 UNIT/ML FlexPen Inject 6 Units into the skin 3 (three) times daily with meals. Per sliding scale 11/25/21  Yes Rhetta MuraSamtani, Jai-Gurmukh, MD  ?insulin glargine (LANTUS) 100 UNIT/ML injection Inject 0.45 mLs (45 Units total) into the skin daily. 01/14/22  Yes Autry-Lott, Randa EvensSimone, DO  ?  liraglutide (VICTOZA) 18 MG/3ML SOPN Inject 1.8 mg into the skin daily. 07/17/21  Yes Shah, Pratik D, DO  ?loratadine (CLARITIN) 10 MG tablet Take 10 mg by mouth daily. 12/22/21  Yes [provider]  ?meclizine (ANTIVERT) 25 MG tablet Take 25 mg by mouth 3 (three) times daily as needed for dizziness. 02/22/22  Yes [provider]  ?metFORMIN (GLUCOPHAGE) 1000 MG tablet Take 1 tablet (1,000 mg total) by mouth 2 (two) times daily with a meal. 11/05/19  Yes Leone Brand, NP  ?naloxone [facility]) nasal spray 4 mg/0.1 mL Place 4 mg into the  nose as needed (overdose).   Yes [provider]  ?oxyCODONE (ROXICODONE) 15 MG immediate release tablet Take 15 mg by mouth 5 (five) times daily. 04/01/22  Yes [provider]  ?pantoprazole (PROTONIX) 40 MG tablet Take 1 tablet (40 mg total) by mouth daily. ?Patient taking differently: Take 80 mg by mouth daily. 09/23/17  Yes Hongalgi, Maximino Greenland, MD  ?pregabalin (LYRICA) 150 MG capsule Take 2 capsules (300 mg total) by mouth in the morning. 11/25/21  Yes Rhetta Mura, MD  ?pregabalin (LYRICA) 150 MG capsule Take 1 capsule (150 mg total) by mouth at bedtime. 11/25/21  Yes Rhetta Mura, MD  ?promethazine (PHENERGAN) 25 MG tablet Take 25 mg by mouth every 4 (four) hours as needed for nausea or vomiting. 02/26/22  Yes [provider]  ?ranolazine (RANEXA) 500 MG 12 hr tablet Take 1 tablet (500 mg total) by mouth 2 (two) times daily. 09/02/21  Yes Chilton Si, MD  ?predniSONE (DELTASONE) 50 MG tablet Take one tablet by mouth once daily for 5 days. ?Patient not taking: Reported on 04/13/2022 03/02/22   Adonis Huguenin, NP  ?   ? ?Allergies    ?Sulfa antibiotics   ? ?Review of Systems   ?Review of Systems  ?Unable to perform ROS: Patient unresponsive  ? ?Physical Exam ?Updated Vital Signs ?BP (!) 87/66   Pulse 73   Temp (!) 97.1 ?F (36.2 ?C)   Resp 16   Ht 6\' 5"  (1.956 m)   Wt (!) 162 kg   SpO2 100%   BMI 42.35 kg/m?  ?Physical Exam ?Constitutional:   ?   Appearance: He is obese. He is ill-appearing.  ?HENT:  ?   Head: Normocephalic and atraumatic.  ?   Right Ear: External ear normal.  ?   Left Ear: External ear normal.  ?   Nose: Nose normal.  ?   Mouth/Throat:  ?   Mouth: Mucous membranes are moist.  ?Eyes:  ?   Conjunctiva/sclera: Conjunctivae normal.  ?   Pupils: Pupils are equal, round, and reactive to light.  ?Cardiovascular:  ?   Rate and Rhythm: Normal rate and regular rhythm.  ?Pulmonary:  ?   Effort: Bradypnea present.  ?   Breath sounds: Decreased air movement  present.  ?Abdominal:  ?   General: There is no distension.  ?   Palpations: Abdomen is soft.  ?   Tenderness: There is no abdominal tenderness.  ?Musculoskeletal:     ?   General: No deformity.  ?   Cervical back: Neck supple. No rigidity.  ?   Comments: Erythema to right foot, left BKA  ?Skin: ?   General: Skin is dry.  ?   Coloration: Skin is pale.  ?Neurological:  ?   GCS: GCS eye subscore is 1. GCS verbal subscore is 1. GCS motor subscore is 5.  ? ? ?ED Results /  Procedures / Treatments   ?Labs ?(all labs ordered are listed, but only abnormal results are displayed) ?Labs Reviewed  ?COMPREHENSIVE METABOLIC PANEL - Abnormal; Notable for the following components:  ?    Result Value  ? Glucose, Bld 123 (*)   ? BUN 26 (*)   ? Creatinine, Ser 1.42 (*)   ? Calcium 8.7 (*)   ? Albumin 2.6 (*)   ? AST 14 (*)   ? All other components within normal limits  ?CBC WITH DIFFERENTIAL/PLATELET - Abnormal; Notable for the following components:  ? WBC 14.6 (*)   ? Hemoglobin 10.8 (*)   ? HCT 36.2 (*)   ? MCV 79.0 (*)   ? MCH 23.6 (*)   ? MCHC 29.8 (*)   ? RDW 15.9 (*)   ? Neutro Abs 11.9 (*)   ? All other components within normal limits  ?URINALYSIS, ROUTINE W REFLEX MICROSCOPIC - Abnormal; Notable for the following components:  ? Protein, ur 100 (*)   ? Bacteria, UA RARE (*)   ? All other components within normal limits  ?RAPID URINE DRUG SCREEN, HOSP PERFORMED - Abnormal; Notable for the following components:  ? Benzodiazepines POSITIVE (*)   ? Tetrahydrocannabinol POSITIVE (*)   ? All other components within normal limits  ?MAGNESIUM - Abnormal; Notable for the following components:  ? Magnesium 1.2 (*)   ? All other components within normal limits  ?ACETAMINOPHEN LEVEL - Abnormal; Notable for the following components:  ? Acetaminophen (Tylenol), Serum <10 (*)   ? All other components within normal limits  ?SALICYLATE LEVEL - Abnormal; Notable for the following components:  ? Salicylate Lvl <7.0 (*)   ? All other components  within normal limits  ?D-DIMER, QUANTITATIVE - Abnormal; Notable for the following components:  ? D-Dimer, Quant 0.70 (*)   ? All other components within normal limits  ?VITAMIN B12 - Abnormal; Notable for the fol

## 2022-04-13 NOTE — Progress Notes (Signed)
Pt transported from ED 33 to CT2 and back by RT, NT, and RN w/ no complications ?

## 2022-04-13 NOTE — Sedation Documentation (Signed)
MD, RT, Pharm, EDT, 2nd RN at bedside ?

## 2022-04-13 NOTE — Progress Notes (Signed)
? ?Office Visit Note ?  ?Patient: Joseph [name]           ?Date of Birth: 1971-09-19           ?MRN: [id number] ?Visit Date: 04/13/2022 ?             ?Requested by: Marva Panda, NP ?[address] ?[place],  [name] [address] ?PCP: Marva Panda, NP ? ?No chief complaint on file. ? ? ? ? ?HPI: ?Patient is a 51 year old gentleman who presents for 2 separate issues.  #1 he has a right heel decubitus ulcer currently using a PRAFO and dressing changes.  #2 he has a left transtibial amputation and was casted for prosthesis last week and should have his prosthesis next week. ? ?Assessment & Plan: ?Visit Diagnoses:  ?No diagnosis found. ? ? ?Plan: Recommended continue with Dial soap cleansing to the right heel 4 x 4 and Ace wrap dressing changes daily wear the PRAFO 24 hours a day. ? ?Follow-Up Instructions: No follow-ups on file.  ? ?Ortho Exam ? ?Patient is alert, oriented, no adenopathy, well-dressed, normal affect, normal respiratory effort. ?Examination patient has a well-healed residual limb on the left.  Examination the right heel he has a decubitus ulcer that is 3 x 5 cm there is an eschar covering the base there is healthy granulation tissue around the edges and the eschar does seem superficial.  There is also a superficial ulcer dorsally over the ankle. ? ?Imaging: ?No results found. ?No images are attached to the encounter. ? ?Labs: ?Lab Results  ?Component Value Date  ? HGBA1C 8.4 (H) 01/07/2022  ? HGBA1C 10.0 (H) 11/18/2021  ? HGBA1C 11.8 (H) 08/15/2021  ? ESRSEDRATE 45 (H) 11/18/2021  ? ESRSEDRATE 41 (H) 09/16/2017  ? CRP 12.1 (H) 11/18/2021  ? CRP 8.9 (H) 09/16/2017  ? REPTSTATUS 01/11/2022 FINAL 01/06/2022  ? CULT  01/06/2022  ?  NO GROWTH 5 DAYS ?Performed at [facility], [address] [place], [name] [address] ?  ? LABORGA ENTEROCOCCUS FAECALIS (A) 01/06/2022  ? ? ? ?Lab Results  ?Component Value Date  ? ALBUMIN 2.4 (L) 01/07/2022  ? ALBUMIN 2.4 (L) 01/06/2022  ? ALBUMIN 1.8 (L)  11/25/2021  ? PREALBUMIN 16.5 (L) 01/07/2022  ? ? ?Lab Results  ?Component Value Date  ? MG 1.2 (L) 01/07/2022  ? MG 1.6 (L) 07/17/2021  ? MG 1.5 (L) 07/10/2021  ? ?Lab Results  ?Component Value Date  ? VD25OH 9.40 (L) 01/07/2022  ? ? ?Lab Results  ?Component Value Date  ? PREALBUMIN 16.5 (L) 01/07/2022  ? ? ?  Latest Ref Rng & Units 01/12/2022  ?  3:32 AM 01/11/2022  ?  5:16 AM 01/10/2022  ?  5:16 AM  ?CBC EXTENDED  ?WBC 4.0 - 10.5 K/uL 8.6   7.9   8.1    ?RBC 4.22 - 5.81 MIL/uL 3.62   3.48   3.25    ?Hemoglobin 13.0 - 17.0 g/dL 8.9   8.6   8.1    ?HCT 39.0 - 52.0 % 29.6   29.2   26.8    ?Platelets 150 - 400 K/uL 294   275   276    ? ? ? ?There is no height or weight on file to calculate BMI. ? ?Orders:  ?No orders of the defined types were placed in this encounter. ? ?No orders of the defined types were placed in this encounter. ? ? ? Procedures: ?No procedures performed ? ?Clinical Data: ?No  additional findings. ? ?ROS: ? ?All other systems negative, except as noted in the HPI. ?Review of Systems ? ?Objective: ?Vital Signs: There were no vitals taken for this visit. ? ?Specialty Comments:  ?No specialty comments available. ? ?PMFS History: ?Patient Active Problem List  ? Diagnosis Date Noted  ? Ulcer of right foot, limited to breakdown of skin (HCC)   ? Below-knee amputation of left lower extremity (HCC)   ? Osteomyelitis (HCC) 01/06/2022  ? Bipolar disorder with depression (HCC)   ? Sepsis (HCC) 11/18/2021  ? Chest pain 11/18/2021  ? Dyspnea 11/18/2021  ? Wound infection   ? Ulcer of left foot with necrosis of bone (HCC)   ? Bicuspid aortic valve 09/14/2021  ? Aneurysm of ascending aorta (HCC) 09/14/2021  ? Hyperkalemia 08/20/2021  ? DM (diabetes mellitus) with peripheral vascular complication (HCC) 08/20/2021  ? Atypical chest pain 07/17/2021  ? Hyponatremia 07/17/2021  ? Leg wound, left 07/17/2021  ? Essential hypertension 07/17/2021  ? Acute hyperglycemia 07/09/2021  ? Left leg cellulitis 04/11/2021  ?  Cellulitis of left leg 03/05/2021  ? Cellulitis 02/18/2021  ? PAD (peripheral artery disease) (HCC) 10/10/2020  ? Osteomyelitis of fourth toe of right foot (HCC)   ? S/P CABG x 4 07/24/2020  ? Non-ST elevation (NSTEMI) myocardial infarction (HCC) 07/22/2020  ? Acute on chronic diastolic heart failure (HCC) 07/22/2020  ? Hypotension 07/18/2020  ? Left leg swelling 07/18/2020  ? Elevated troponin 07/18/2020  ? AKI (acute kidney injury) (HCC) 07/18/2020  ? Elevated brain natriuretic peptide (BNP) level 07/18/2020  ? GERD (gastroesophageal reflux disease) 07/18/2020  ? Unstable angina (HCC) 11/01/2019  ? Edema 06/13/2019  ? Leukocytosis 05/31/2019  ? Smoker 03/08/2019  ? Chronic pain 03/08/2019  ? Chronic back pain 02/16/2019  ? Deep venous thrombosis (HCC) 02/16/2019  ? Obesity, Class III, BMI 40-49.9 (morbid obesity) (HCC) 02/16/2019  ? Peripheral nerve disease 02/16/2019  ? COPD (chronic obstructive pulmonary disease) (HCC) 01/22/2019  ? Anxiety disorder 03/26/2018  ? Open wound of toe 10/12/2017  ? Sinusitis 10/12/2017  ? Abdominal pain   ? Loss of weight   ? Diabetic foot infection (HCC) 09/16/2017  ? Uncontrolled type 2 diabetes mellitus with hyperglycemia, with long-term current use of insulin (HCC) 09/16/2017  ? Cellulitis of right foot   ? Chronic ulcer of great toe of right foot (HCC) 09/09/2017  ? Recurrent chest pain 07/29/2016  ? Hyperglycemia   ? Insulin dependent type 2 diabetes mellitus (HCC) 04/29/2015  ? OSA (obstructive sleep apnea) 05/08/2013  ? History of pulmonary embolus (PE) 04/27/2013  ? CAD -S/P PCI 11/02/19  09/10/2008  ? Hyperlipidemia 09/09/2008  ? ?Past Medical History:  ?Diagnosis Date  ? Aneurysm of ascending aorta (HCC) 09/14/2021  ? Anxiety   ? Arthritis   ? "knees, shoulders, hips, ankles" (07/29/2016)  ? Asthma   ? Bicuspid aortic valve 09/14/2021  ? CAD (coronary artery disease)   ? a. 2017: s/p BMS to distal Cx; b. LHC 05/30/2019: 80% mid, distal RCA s/p DES, 30% narrowing of d LM,  widely patent LAD w/ luminal irregularities, widely patent stent in dCX  w/ 90+% stenosis distal to stent beofre small trifurcating obtuse marginal (potentially area of restenosis)  ? Chronic bronchitis (HCC)   ? Chronic ulcer of right great toe (HCC) 09/19/2017  ? COPD (chronic obstructive pulmonary disease) (HCC)   ? Depression   ? DVT (deep venous thrombosis) (HCC)   ? GERD (gastroesophageal reflux disease)   ? Hyperlipidemia   ?  Hypertension   ? Myocardial infarction [facility](HCC) 1996  ? "light one"  ? PE (pulmonary embolism) 04/2013  ? On chronic Xarelto  ? Peripheral nerve disease   ? Pneumonia "several times"  ? Sleep apnea   ? Type II diabetes mellitus (HCC)   ?  ?Family History  ?Problem Relation Age of Onset  ? Hypertension Brother   ? Hypertension Father   ? Diabetes Other   ? Hyperlipidemia Other   ?  ?Past Surgical History:  ?Procedure Laterality Date  ? ABDOMINAL AORTOGRAM W/LOWER EXTREMITY N/A 10/10/2020  ? Procedure: ABDOMINAL AORTOGRAM W/LOWER EXTREMITY;  Surgeon: Sherren KernsFields, Charles E, MD;  [facility];  Service: Cardiovascular;  Laterality: N/A;  ? AMPUTATION Right 09/21/2017  ? Procedure: RIGHT GREAT TOE AMPUTATION, POSSIBLE VAC;  Surgeon: Tarry KosXu, Naiping M, MD;  Location: [facility] OR;  Service: Orthopedics;  Laterality: Right;  ? AMPUTATION Left 08/13/2020  ? Procedure: LEFT FOOT 4TH RAY AMPUTATION;  Surgeon: Nadara Mustarduda, Marcus V, MD;  [facility];  Service: Orthopedics;  Laterality: Left;  ? AMPUTATION Left 11/20/2021  ? Procedure: LEFT TRANSMETATARSAL AMPUTATION;  Surgeon: Nadara Mustarduda, Marcus V, MD;  [facility];  Service: Orthopedics;  Laterality: Left;  ? AMPUTATION Left 01/08/2022  ? Procedure: LEFT BELOW KNEE AMPUTATION;  Surgeon: Nadara Mustarduda, Marcus V, MD;  [facility];  Service: Orthopedics;  Laterality: Left;  ? AORTOGRAM Bilateral 03/13/2021  ? Procedure: ABDOMINAL AORTOGRAM WITH Left LOWER EXTREMITY RUNOFF;  Surgeon: Leonie DouglasHawken, Thomas N, MD;  [facility];  Service: Vascular;  Laterality: Bilateral;   ? APPLICATION OF WOUND VAC  01/08/2022  ? Procedure: APPLICATION OF WOUND VAC;  Surgeon: Nadara Mustarduda, Marcus V, MD;  [facility];  Service: Orthopedics;;  ? CARDIAC CATHETERIZATION  2006  ? CARDIAC CATHETERIZATION  1996

## 2022-04-13 NOTE — Progress Notes (Signed)
Pt transported on vent from 033 to CT and back without any complications. RN at bedside, RT will continue to monitor.  ?

## 2022-04-13 NOTE — ED Notes (Signed)
Bhagat MD at bedside. Pt is responsive and oriented.  ?

## 2022-04-13 NOTE — ED Notes (Signed)
Pt transported to CT 3 

## 2022-04-13 NOTE — ED Triage Notes (Signed)
Pt arrived to ED via EMS from home. LKW 1530. Pt was found unresponsive in his bed. Pt has had decreased appetite for several days. Has a RLE wound that he is being seen for. 22g L wrist. 92% RA. EMS put pt on 3L and sats increased to 96%. HR 82 NSR w/ R bundle branch block. BP 120/60 ?

## 2022-04-13 NOTE — ED Notes (Addendum)
Warm blankets applied. At this time pt is response to name with eye opening, will withdraw from pain or stimulation purposefully. Pt is compliant with ventilation, pressures remain soft, but within normal limits. HR maintaining 70-80s. Pt not showing signs of pain, discomfort at this time. Family is at bedside.  ? ?

## 2022-04-14 ENCOUNTER — Inpatient Hospital Stay (HOSPITAL_COMMUNITY): Payer: Medicare HMO

## 2022-04-14 ENCOUNTER — Encounter: Payer: Self-pay | Admitting: Family

## 2022-04-14 DIAGNOSIS — I110 Hypertensive heart disease with heart failure: Secondary | ICD-10-CM | POA: Diagnosis present

## 2022-04-14 DIAGNOSIS — J9601 Acute respiratory failure with hypoxia: Secondary | ICD-10-CM

## 2022-04-14 DIAGNOSIS — Z20822 Contact with and (suspected) exposure to covid-19: Secondary | ICD-10-CM | POA: Diagnosis present

## 2022-04-14 DIAGNOSIS — G4733 Obstructive sleep apnea (adult) (pediatric): Secondary | ICD-10-CM | POA: Diagnosis present

## 2022-04-14 DIAGNOSIS — J440 Chronic obstructive pulmonary disease with acute lower respiratory infection: Secondary | ICD-10-CM | POA: Diagnosis present

## 2022-04-14 DIAGNOSIS — J189 Pneumonia, unspecified organism: Secondary | ICD-10-CM

## 2022-04-14 DIAGNOSIS — I5032 Chronic diastolic (congestive) heart failure: Secondary | ICD-10-CM | POA: Diagnosis present

## 2022-04-14 DIAGNOSIS — R4189 Other symptoms and signs involving cognitive functions and awareness: Secondary | ICD-10-CM | POA: Diagnosis present

## 2022-04-14 DIAGNOSIS — K219 Gastro-esophageal reflux disease without esophagitis: Secondary | ICD-10-CM | POA: Diagnosis present

## 2022-04-14 DIAGNOSIS — Z6841 Body Mass Index (BMI) 40.0 and over, adult: Secondary | ICD-10-CM | POA: Diagnosis not present

## 2022-04-14 DIAGNOSIS — J322 Chronic ethmoidal sinusitis: Secondary | ICD-10-CM | POA: Diagnosis present

## 2022-04-14 DIAGNOSIS — E11621 Type 2 diabetes mellitus with foot ulcer: Secondary | ICD-10-CM | POA: Diagnosis present

## 2022-04-14 DIAGNOSIS — G9341 Metabolic encephalopathy: Secondary | ICD-10-CM

## 2022-04-14 DIAGNOSIS — E1151 Type 2 diabetes mellitus with diabetic peripheral angiopathy without gangrene: Secondary | ICD-10-CM | POA: Diagnosis present

## 2022-04-14 DIAGNOSIS — I2510 Atherosclerotic heart disease of native coronary artery without angina pectoris: Secondary | ICD-10-CM | POA: Diagnosis present

## 2022-04-14 DIAGNOSIS — Z9641 Presence of insulin pump (external) (internal): Secondary | ICD-10-CM | POA: Diagnosis present

## 2022-04-14 DIAGNOSIS — F419 Anxiety disorder, unspecified: Secondary | ICD-10-CM | POA: Diagnosis present

## 2022-04-14 DIAGNOSIS — F319 Bipolar disorder, unspecified: Secondary | ICD-10-CM | POA: Diagnosis present

## 2022-04-14 DIAGNOSIS — I7121 Aneurysm of the ascending aorta, without rupture: Secondary | ICD-10-CM | POA: Diagnosis present

## 2022-04-14 DIAGNOSIS — R4182 Altered mental status, unspecified: Secondary | ICD-10-CM | POA: Diagnosis present

## 2022-04-14 DIAGNOSIS — L89610 Pressure ulcer of right heel, unstageable: Secondary | ICD-10-CM | POA: Diagnosis present

## 2022-04-14 DIAGNOSIS — N179 Acute kidney failure, unspecified: Secondary | ICD-10-CM | POA: Diagnosis present

## 2022-04-14 DIAGNOSIS — E785 Hyperlipidemia, unspecified: Secondary | ICD-10-CM | POA: Diagnosis present

## 2022-04-14 DIAGNOSIS — J9602 Acute respiratory failure with hypercapnia: Secondary | ICD-10-CM | POA: Diagnosis present

## 2022-04-14 DIAGNOSIS — I959 Hypotension, unspecified: Secondary | ICD-10-CM | POA: Diagnosis present

## 2022-04-14 LAB — PROCALCITONIN: Procalcitonin: <0.1 ng/mL

## 2022-04-14 LAB — POCT I-STAT 7, (LYTES, BLD GAS, ICA,H+H)
Acid-Base Excess: 3 mmol/L — ABNORMAL HIGH (ref 0.0–2.0)
Bicarbonate: 27.2 mmol/L (ref 20.0–28.0)
Calcium, Ion: 1.17 mmol/L (ref 1.15–1.40)
HCT: 26 % — ABNORMAL LOW (ref 39.0–52.0)
Hemoglobin: 8.8 g/dL — ABNORMAL LOW (ref 13.0–17.0)
O2 Saturation: 71 %
Patient temperature: 98.5
Potassium: 4.3 mmol/L (ref 3.5–5.1)
Sodium: 138 mmol/L (ref 135–145)
TCO2: 28 mmol/L (ref 22–32)
pCO2 arterial: 39 mmHg (ref 32–48)
pH, Arterial: 7.452 — ABNORMAL HIGH (ref 7.35–7.45)
pO2, Arterial: 35 mmHg — CL (ref 83–108)

## 2022-04-14 LAB — CBC
HCT: 30.1 % — ABNORMAL LOW (ref 39.0–52.0)
Hemoglobin: 9.4 g/dL — ABNORMAL LOW (ref 13.0–17.0)
MCH: 24.2 pg — ABNORMAL LOW (ref 26.0–34.0)
MCHC: 31.2 g/dL (ref 30.0–36.0)
MCV: 77.4 fL — ABNORMAL LOW (ref 80.0–100.0)
Platelets: 287 10*3/uL (ref 150–400)
RBC: 3.89 MIL/uL — ABNORMAL LOW (ref 4.22–5.81)
RDW: 15.9 % — ABNORMAL HIGH (ref 11.5–15.5)
WBC: 10.5 10*3/uL (ref 4.0–10.5)
nRBC: 0 % (ref 0.0–0.2)

## 2022-04-14 LAB — MRSA NEXT GEN BY PCR, NASAL: MRSA by PCR Next Gen: DETECTED — AB

## 2022-04-14 LAB — STREP PNEUMONIAE URINARY ANTIGEN: Strep Pneumo Urinary Antigen: NEGATIVE

## 2022-04-14 LAB — CREATININE, SERUM
Creatinine, Ser: 1.54 mg/dL — ABNORMAL HIGH (ref 0.61–1.24)
GFR, Estimated: 55 mL/min — ABNORMAL LOW (ref >60)

## 2022-04-14 LAB — GLUCOSE, CAPILLARY
Glucose-Capillary: 79 mg/dL (ref 70–99)
Glucose-Capillary: 76 mg/dL (ref 70–99)
Glucose-Capillary: 69 mg/dL — ABNORMAL LOW (ref 70–99)
Glucose-Capillary: 91 mg/dL (ref 70–99)
Glucose-Capillary: 101 mg/dL — ABNORMAL HIGH (ref 70–99)
Glucose-Capillary: 127 mg/dL — ABNORMAL HIGH (ref 70–99)
Glucose-Capillary: 110 mg/dL — ABNORMAL HIGH (ref 70–99)

## 2022-04-14 LAB — RESP PANEL BY RT-PCR (FLU A&B, COVID) ARPGX2
Influenza A by PCR: NEGATIVE
Influenza B by PCR: NEGATIVE
SARS Coronavirus 2 by RT PCR: NEGATIVE

## 2022-04-14 LAB — HIV ANTIBODY (ROUTINE TESTING W REFLEX): HIV Screen 4th Generation wRfx: NONREACTIVE

## 2022-04-14 LAB — TRIGLYCERIDES: Triglycerides: 62 mg/dL (ref <150)

## 2022-04-14 LAB — TSH: TSH: 0.787 u[IU]/mL (ref 0.350–4.500)

## 2022-04-14 LAB — VITAMIN B12: Vitamin B-12: 175 pg/mL — ABNORMAL LOW (ref 180–914)

## 2022-04-14 LAB — TROPONIN I (HIGH SENSITIVITY): Troponin I (High Sensitivity): 10 ng/L (ref <18)

## 2022-04-14 MED ORDER — BUPROPION HCL 100 MG PO TABS
100.0000 mg | ORAL_TABLET | Freq: Two times a day (BID) | ORAL | Status: DC
  Administered 2022-04-15 – 2022-04-16 (×3): 100 mg via ORAL
  Filled 2022-04-14 (×4): qty 1

## 2022-04-14 MED ORDER — DEXMEDETOMIDINE HCL IN NACL 400 MCG/100ML IV SOLN
0.4000 ug/kg/h | INTRAVENOUS | Status: DC
  Administered 2022-04-14 (×2): 0.4 ug/kg/h via INTRAVENOUS
  Administered 2022-04-14: 1 ug/kg/h via INTRAVENOUS
  Filled 2022-04-14 (×3): qty 100

## 2022-04-14 MED ORDER — INSULIN ASPART 100 UNIT/ML IJ SOLN
0.0000 [IU] | INTRAMUSCULAR | Status: DC
  Administered 2022-04-14: 1 [IU] via SUBCUTANEOUS

## 2022-04-14 MED ORDER — DEXTROSE 50 % IV SOLN
INTRAVENOUS | Status: AC
  Administered 2022-04-14: 12.5 g via INTRAVENOUS
  Filled 2022-04-14: qty 50

## 2022-04-14 MED ORDER — FENOFIBRATE 160 MG PO TABS
160.0000 mg | ORAL_TABLET | Freq: Every day | ORAL | Status: DC
  Administered 2022-04-14: 160 mg
  Filled 2022-04-14 (×2): qty 1

## 2022-04-14 MED ORDER — HEPARIN SODIUM (PORCINE) 5000 UNIT/ML IJ SOLN
5000.0000 [IU] | Freq: Three times a day (TID) | INTRAMUSCULAR | Status: DC
  Administered 2022-04-14 – 2022-04-16 (×6): 5000 [IU] via SUBCUTANEOUS
  Filled 2022-04-14 (×6): qty 1

## 2022-04-14 MED ORDER — INSULIN ASPART 100 UNIT/ML IJ SOLN: 0.0000 [IU] | Freq: Three times a day (TID) | INTRAMUSCULAR | Status: DC

## 2022-04-14 MED ORDER — FENTANYL CITRATE PF 50 MCG/ML IJ SOSY: 50.0000 ug | PREFILLED_SYRINGE | INTRAMUSCULAR | Status: DC | PRN

## 2022-04-14 MED ORDER — POLYETHYLENE GLYCOL 3350 17 G PO PACK
17.0000 g | PACK | Freq: Every day | ORAL | Status: DC
  Administered 2022-04-14: 17 g
  Filled 2022-04-14: qty 1

## 2022-04-14 MED ORDER — OXYCODONE HCL 5 MG PO TABS
5.0000 mg | ORAL_TABLET | ORAL | Status: DC | PRN
  Administered 2022-04-14 – 2022-04-15 (×3): 5 mg via ORAL
  Filled 2022-04-14 (×3): qty 1

## 2022-04-14 MED ORDER — DOCUSATE SODIUM 50 MG/5ML PO LIQD: 100.0000 mg | Freq: Two times a day (BID) | ORAL | Status: DC | PRN

## 2022-04-14 MED ORDER — PANTOPRAZOLE 2 MG/ML SUSPENSION
40.0000 mg | Freq: Every day | ORAL | Status: DC
  Administered 2022-04-14: 40 mg
  Filled 2022-04-14: qty 20

## 2022-04-14 MED ORDER — DOCUSATE SODIUM 50 MG/5ML PO LIQD
100.0000 mg | Freq: Two times a day (BID) | ORAL | Status: DC
  Administered 2022-04-14 (×2): 100 mg
  Filled 2022-04-14 (×2): qty 10

## 2022-04-14 MED ORDER — ATORVASTATIN CALCIUM 80 MG PO TABS
80.0000 mg | ORAL_TABLET | Freq: Every day | ORAL | Status: DC
  Administered 2022-04-14: 80 mg
  Filled 2022-04-14: qty 1

## 2022-04-14 MED ORDER — IPRATROPIUM-ALBUTEROL 0.5-2.5 (3) MG/3ML IN SOLN: 3.0000 mL | RESPIRATORY_TRACT | Status: DC | PRN

## 2022-04-14 MED ORDER — MUPIROCIN 2 % EX OINT
1.0000 "application " | TOPICAL_OINTMENT | Freq: Two times a day (BID) | CUTANEOUS | Status: DC
  Administered 2022-04-14 – 2022-04-16 (×5): 1 via NASAL
  Filled 2022-04-14: qty 22

## 2022-04-14 MED ORDER — CHLORHEXIDINE GLUCONATE 0.12% ORAL RINSE (MEDLINE KIT)
15.0000 mL | Freq: Two times a day (BID) | OROMUCOSAL | Status: DC
  Administered 2022-04-14 – 2022-04-15 (×2): 15 mL via OROMUCOSAL

## 2022-04-14 MED ORDER — RANOLAZINE ER 500 MG PO TB12
500.0000 mg | ORAL_TABLET | Freq: Two times a day (BID) | ORAL | Status: DC
  Filled 2022-04-14: qty 1

## 2022-04-14 MED ORDER — SODIUM CHLORIDE 0.9 % IV SOLN
500.0000 mg | INTRAVENOUS | Status: DC
  Administered 2022-04-14 – 2022-04-15 (×2): 500 mg via INTRAVENOUS
  Filled 2022-04-14 (×3): qty 5

## 2022-04-14 MED ORDER — ASPIRIN 81 MG PO CHEW
81.0000 mg | CHEWABLE_TABLET | Freq: Every day | ORAL | Status: DC
  Administered 2022-04-14: 81 mg via NASOGASTRIC
  Filled 2022-04-14: qty 1

## 2022-04-14 MED ORDER — BUPROPION HCL ER (SR) 150 MG PO TB12
150.0000 mg | ORAL_TABLET | Freq: Two times a day (BID) | ORAL | Status: DC
  Filled 2022-04-14: qty 1

## 2022-04-14 MED ORDER — ORAL CARE MOUTH RINSE
15.0000 mL | OROMUCOSAL | Status: DC
  Administered 2022-04-14 – 2022-04-15 (×8): 15 mL via OROMUCOSAL

## 2022-04-14 MED ORDER — INSULIN ASPART 100 UNIT/ML IJ SOLN: 0.0000 [IU] | Freq: Every day | INTRAMUSCULAR | Status: DC

## 2022-04-14 MED ORDER — DEXTROSE 50 % IV SOLN: 12.5000 g | INTRAVENOUS | Status: AC

## 2022-04-14 MED ORDER — PANTOPRAZOLE SODIUM 40 MG IV SOLR: 40.0000 mg | Freq: Every day | INTRAVENOUS | Status: DC

## 2022-04-14 MED ORDER — SODIUM CHLORIDE 0.9 % IV SOLN
1.0000 g | INTRAVENOUS | Status: DC
  Administered 2022-04-14 – 2022-04-15 (×2): 1 g via INTRAVENOUS
  Filled 2022-04-14 (×3): qty 10

## 2022-04-14 MED ORDER — DEXTROSE IN LACTATED RINGERS 5 % IV SOLN: INTRAVENOUS | Status: DC

## 2022-04-14 MED ORDER — CLOPIDOGREL BISULFATE 75 MG PO TABS
75.0000 mg | ORAL_TABLET | Freq: Every day | ORAL | Status: DC
  Administered 2022-04-14: 75 mg
  Filled 2022-04-14: qty 1

## 2022-04-14 MED ORDER — PREGABALIN 50 MG PO CAPS
100.0000 mg | ORAL_CAPSULE | Freq: Every day | ORAL | Status: DC
  Administered 2022-04-14: 100 mg
  Filled 2022-04-14: qty 2
  Filled 2022-04-14: qty 1

## 2022-04-14 MED ORDER — POLYETHYLENE GLYCOL 3350 17 G PO PACK: 17.0000 g | PACK | Freq: Every day | ORAL | Status: DC | PRN

## 2022-04-14 MED ORDER — ESCITALOPRAM OXALATE 10 MG PO TABS
10.0000 mg | ORAL_TABLET | Freq: Every day | ORAL | Status: DC
  Administered 2022-04-14: 10 mg
  Filled 2022-04-14: qty 1

## 2022-04-14 MED ORDER — CHLORHEXIDINE GLUCONATE CLOTH 2 % EX PADS
6.0000 | MEDICATED_PAD | Freq: Every day | CUTANEOUS | Status: DC
  Administered 2022-04-15: 6 via TOPICAL

## 2022-04-14 MED ORDER — ICOSAPENT ETHYL 1 G PO CAPS
2.0000 g | ORAL_CAPSULE | Freq: Two times a day (BID) | ORAL | Status: DC
  Filled 2022-04-14: qty 2

## 2022-04-14 MED ORDER — INSULIN ASPART 100 UNIT/ML IJ SOLN: 0.0000 [IU] | INTRAMUSCULAR | Status: DC

## 2022-04-14 NOTE — Procedures (Signed)
Patient Name: Joseph [name]  ?MRN: [id number]  ?Epilepsy Attending: Charlsie Quest  ?Referring Physician/Provider: Rejeana Brock, MD ?Date: 04/14/2022 ?Duration: 22.07 mins ? ?Patient history: 51 yo M presenting with lethargy requiring intubation. EEG to evaluate for seizure ? ?Level of alertness: lethargic  ? ?AEDs during EEG study: None ? ?Technical aspects: This EEG study was done with scalp electrodes positioned according to the 10-20 International system of electrode placement. Electrical activity was acquired at a sampling rate of 500Hz  and reviewed with a high frequency filter of 70Hz  and a low frequency filter of 1Hz . EEG data were recorded continuously and digitally stored.  ? ?Description: EEG showed continuous generalized 3 to 7 Hz theta-delta slowing admixed with 15-18Hz  frontocentral beta activity. Hyperventilation and photic stimulation were not performed.    ? ?ABNORMALITY ?- Continuous slow, generalized ? ?IMPRESSION: ?This study is suggestive of moderate to severe diffuse encephalopathy, nonspecific etiology. No seizures or epileptiform discharges were seen throughout the recording. ? ?  ? ?

## 2022-04-14 NOTE — Progress Notes (Signed)
EEG complete - results pending 

## 2022-04-14 NOTE — Progress Notes (Signed)
eLink Physician-Brief Progress Note ?Patient Name: Joseph [name] ?DOB: 05-Aug-1971 ?MRN: [id number] ? ? ?Date of Service ? 04/14/2022  ?HPI/Events of Note ? Patient admitted through the ED with altered mental status, the etiology of which is as yet undetermined, work up is in progress, he was intubated in the ED for airway  protection / acute hypoxemic / hypercapnic respiratory failure, and is currently on the ventilator.  ?eICU Interventions ? New Patient Evaluation.  ? ? ? ?  ? ?[name] ?04/14/2022, 1:39 AM ?

## 2022-04-14 NOTE — Progress Notes (Signed)
eLink Physician-Brief Progress Note ?Patient Name: Joseph [name] ?DOB: [birth year] ?MRN: [id number] ? ? ?Date of Service ? 04/14/2022  ?HPI/Events of Note ? Patient asking for resumption of home pain medications which appear excessive and duplicative on the home medication list, and may have been a factor in his altered mental status.  ?eICU Interventions ? Lower dose Oxycodone and Wellbutrin ordered.  ? ? ? ?  ? ?[name] ?04/14/2022, 11:18 PM ?

## 2022-04-14 NOTE — Progress Notes (Signed)
Pt transported from ED33 to 4N31 by RT and RN w/no complications ?

## 2022-04-14 NOTE — Progress Notes (Signed)
ABG results (venous) sent to [facility]. NO repeat needed at this time.  ? Latest Reference Range & Units Most Recent  ?Sample type  ARTERIAL ?04/14/22 04:56  ?pH, Arterial 7.35 - 7.45  7.452 (H) ?04/14/22 04:56  ?pCO2 arterial 32 - 48 mmHg 39.0 ?04/14/22 04:56  ?pO2, Arterial 83 - 108 mmHg 35 (LL) ?04/14/22 04:56  ?pH, Ven 7.25 - 7.43  7.368 ?04/13/22 17:41  ?pCO2, Ven 44 - 60 mmHg 50.0 ?04/13/22 17:41  ?pO2, Ven 32 - 45 mmHg 56 (H) ?04/13/22 17:41  ?TCO2 22 - 32 mmol/L 28 ?04/14/22 04:56  ?Acid-Base Excess 0.0 - 2.0 mmol/L 3.0 (H) ?04/14/22 04:56  ?Bicarbonate 20.0 - 28.0 mmol/L 27.2 ?04/14/22 04:56  ?O2 Saturation % 71 ?04/14/22 04:56  ?Patient temperature  98.5 F ?04/14/22 04:56  ?Collection site  RADIAL, ALLEN'S TEST ACCEPTABLE ?04/14/22 04:56  ?(LL): Data is critically low ?(H): Data is abnormally high ?

## 2022-04-14 NOTE — Progress Notes (Signed)
RT Note:  Received patient on 1ml/kg. Increased to 64ml/kg per latest order. Will obtain ABG in one hour. ?

## 2022-04-14 NOTE — Progress Notes (Signed)
Subjective: ?No acute events.  ? ?Exam: ?Vitals:  ? 04/14/22 0600 04/14/22 0747  ?BP: 96/76 109/83  ?Pulse: 68 71  ?Resp: 16 16  ?Temp: 99 ?F (37.2 ?C)   ?SpO2: 97% 97%  ? ?Gen: In bed, intubated ?Limited by sedation(on precedex).  ? ?Neuro: ?MS: opens eyes to noxious stimulation ?KV:QQVZ midline, slightly dysconjugate, but conjugate on arousal, blinks to eyelid stim bilaterally ?Motor: spontaneous movement in BUE, grimaces on right, but doe snot move much to pain on either side.  ?Sensory:as above.  ? ? ?Pertinent Labs: ?TSH 0.787 ?B12 175 ?Ammonia 26 ?UDS + for benzos ? ? ?Impression: 51 yo M presenting with lethargy requiring intubation. He is on multiple sedating medications including lyrica(150mg  qam, 300 qpm), Oxycondone(15mg  5xday), xanax(1mg  daily prn), phenergan, meclizine(unclear how often he takes PRNs). Certainly with his borderline creatinine, his lyrica dose could be contributing to his daily lethargy. I agree low suspicion for seizures, but would still favor getting a routine EEG.  ? ?Recommendations: ?1) EEG  ?2) limit sedating medications ?3) MRI once able ?4) Will follow.  ? ?Ritta Slot, MD ?[facility] ?[phone] ? ?If 7pm- 7am, please page neurology on call as listed in AMION. ? ?

## 2022-04-14 NOTE — Progress Notes (Signed)
Fully awake and appropriate.  ?Following commands ?VE/ F/VT all acceptable ?Plan ?Dc sedation  ?Extubate ? ?MRI later today  ? ?Simonne Martinet ACNP-BC ?[facility] ?Pager # [phone] OR # [phone] if no answer ? ?

## 2022-04-14 NOTE — Progress Notes (Signed)
?  Transition of Care (TOC) Screening Note ? ? ?Patient Details  ?Name: Joseph [name] ?Date of Birth: October 15, 1971 ? ? ?Transition of Care (TOC) CM/SW Contact:    ?Mearl Latin, LCSW ?Phone Number: ?04/14/2022, 9:45 AM ? ? ? ?Transition of Care Department [facility]) has reviewed patient and no TOC needs have been identified at this time. We will continue to monitor patient advancement through interdisciplinary progression rounds. If new patient transition needs arise, please place a TOC consult.  ? ?Patient is active with [facility] RN and will need new orders at discharge if home health is needed.  ? ? ?

## 2022-04-14 NOTE — Progress Notes (Signed)
? ?NAME:  Joseph [name], MRN:  [id number], DOB:  03/13/1971, LOS: 0 ?ADMISSION DATE:  04/13/2022, CONSULTATION DATE: 04/13/22 ?REFERRING MD:  Doren Custard, CHIEF COMPLAINT:  AMS   ? ?History of Present Illness:  ?51 yo man with a history of CAD, MI, DM, here with AMS.  ? ?Sudden slurred speech at 330, felt like sugar was dropping, concern for "beeping" insulin pump, then he lost consciousness, vomiting.  Consciousness waxed and waned.  ?Decreased appetite for several days, increased sleeping.  Had some chest pain and sob last week, but none since.  ?Per notes he was responsive and oriented at 646 pm (neuro eval, after intubation).  ABG does not reveal significant changes after intubation.   ?Per notes BS normal by EMS, blood glucose in ED 118. ?On arrival to ED only withdrawing to pain but otherwise unresponsive.   Opened eyes with 4 of narcan (5pm).  Insufficient respirations and low oxygen saturation.   ?Intubated.  ?Mild hypercarbia (51-->49 after intubation) ? ? ?Pertinent  Medical History  ? ?Chronic ulcer of R heel and midfoot ?DM ?HTN  ?HLD ?CAD, hx of MI ?PE hx, AC stopped after CABG (?) ?Obesity with BMI 42 ?Obstructive Sleep apnea ?PVD s/p R toe amputation ?L BKA ?Ascending aortic aneurysm  ?COPD  ?Anxiety/depression ?CHF grade 2 diastolic  ?Significant Hospital Events: ?Including procedures, antibiotic start and stop dates in addition to other pertinent events   ?4/25 admitted after loss of LOC  & on EMS arrival minimally responsive and hypoxic so intubated. Etiology not clear. Mental status seemed to have improved post intubation. CTX and azith started for possible PNA and sinusitis  ?4/26 working on weaning sedating meds  ? ?Interim History / Subjective:  ?Sedated  ?Objective   ?Blood pressure 109/83, pulse 71, temperature 99 ?F (37.2 ?C), resp. rate 16, height 6\' 5"  (1.956 m), weight (Abnormal) 159.2 kg, SpO2 97 %. ?   ?Vent Mode: PRVC ?FiO2 (%):  [40 %-100 %] 40 % ?Set Rate:  [16 bmp] 16 bmp ?Vt Set:  [620  mL-710 mL] 710 mL ?PEEP:  [5 cmH20] 5 cmH20 ?Plateau Pressure:  [20 [id number] cmH20] 21 cmH20  ? ?Intake/Output Summary (Last 24 hours) at 04/14/2022 0849 ?Last data filed at 04/14/2022 0600 ?Gross per 24 hour  ?Intake 1836.97 ml  ?Output 1585 ml  ?Net 251.97 ml  ? ?Filed Weights  ? 04/13/22 1738 04/14/22 0100  ?Weight: (Abnormal) 162 kg (Abnormal) 159.2 kg  ? ? ?Examination: ?General obese chronically ill appearing 51 year old male.  ?HENT NCAT no JVD orally intubated ?Pulm Clear. Decreased bases  ?Card RRR ?Abd soft + bowel sounds ?Ext Chronic RLE edema and non-stageable wound warm w/ brisk CR  ?Neuro sedated on precedex. Currently  ?GU clear yellow  ? ?Resolved Hospital Problem list   ? ? ?Assessment & Plan:  ? ?Principal Problem: ?  Acute metabolic encephalopathy ?Active Problems: ?  Acute respiratory failure with hypoxia ([place]) ?  Hyperlipidemia ?  CAD -S/P PCI 11/02/19  ?  History of pulmonary embolus (PE) ?  OSA (obstructive sleep apnea) ?  Insulin dependent type 2 diabetes mellitus ([place]) ?  Chronic ulcer of great toe of right foot ([place]) ?  Anxiety disorder ?  COPD (chronic obstructive pulmonary disease) ([place]) ?  Obesity, Class III, BMI 40-49.9 (morbid obesity) ([place]) ?  Sinusitis ?  Smoker ?  GERD (gastroesophageal reflux disease) ?  S/P CABG x 4 ?  PAD (peripheral artery disease) ([place]) ?  Essential hypertension ?  DM (diabetes mellitus) with peripheral vascular complication (HCC) ?  Aneurysm of ascending aorta (HCC) ?  Bipolar disorder with depression ([place]) ?  Below-knee amputation of left lower extremity ([place]) ?  AMS (altered mental status) ?  Pneumonia ? ? ?Acute encephalopathy:  ?Unclear cause. Appears to have resolved significantly after intubation. CT imaging unrevealing, CTA no LVO, was some bubbly fluid in right ethmoid which can be seen w/ sinusitis. UDS + THC and benzo. Still not clear what happened hypoglycemia would have made sense but glucose was not low on initial check. ? Syncope  ?-abg reviewed   ?Plan  ?PAD protocol RASS goal 0 ?MRI brain  ?Additional recs per neuro ? ?Acute hypoxic respiratory failure w/ bibasilar PNA ? CAP vs aspiration.  ?H/o OSA (not using since December as machine broke)  ?H/o COPD  ?Plan ?Cont full vent support and initiate PSV and SBT as soon as mental status will support ?VAP bundle  ?PAD protocol RASS goal 0 ?Am CXR ?Day 2/3 azith  ?Day 2/5 ceftriaxone  ?Cont BDs ? ?Right Ethmoid Sinusitis  ?Plan ?Abx addressed above ? ? ?Mild AKI 1.35from bl 1.1  ?Plan ?Avoid hypotension ?Renal dose meds ?Strict I&O ?Am Chem  ? ? ?Chronic ulcer of R heel and midfoot ?Plan ?Wound consulted ? ?DM - actually glucose on low sided.  ?Plan ?Ssi to sensitive  ?Add dextrose to MIVFs  ? ?H/o HTN  ?Plan ?Holding home meds ? ?HLD  ?Plan ?Cont statin  ? ?H/o CAD, hx of MI  ?Plan ?Cont asa and plavix  ? ?H/o Ascending aortic aneurysm  ?Plan ?Out-pt follow up  ? ? ? ?Best Practice (right click and "Reselect all SmartList Selections" daily)  ? ?Diet/type: NPO->in not extubated later today start tubefeeds.  ?DVT prophylaxis: prophylactic heparin  ?GI prophylaxis: PPI ?Lines: N/A ?Foley:  Yes, and it is no longer needed ?Code Status:  full code ?Last date of multidisciplinary goals of care discussion []  ? ?My cct 34 min  ? ?Erick Colace ACNP-BC ?[place] ?Pager # [phone] OR # [phone] if no answer ? ?

## 2022-04-14 NOTE — Progress Notes (Signed)
eLink Physician-Brief Progress Note ?Patient Name: Joseph [name] ?DOB: 09-Jan-1971 ?MRN: [id number] ? ? ?Date of Service ? 04/14/2022  ?HPI/Events of Note ? Patient extubated > 2 hours ago and has passed a bedside swallow test.  ?eICU Interventions ? Diabetic / heart healthy diet ordered.  ? ? ? ?  ? ?Thomasene Lot Lyvia Mondesir ?04/14/2022, 8:45 PM ?

## 2022-04-14 NOTE — Procedures (Signed)
Extubation Procedure Note ? ?Patient Details:   ?Name: Joseph [name] ?DOB: 03-02-1971 ?MRN: [id number] ?  ?Airway Documentation:  ?  ?Vent end date: 04/14/22 Vent end time: 1438  ? ?Evaluation ? O2 sats: stable throughout ?Complications: No apparent complications ?Patient did tolerate procedure well. ?Bilateral Breath Sounds: Clear, Diminished ?  ?Pt extubated to 4L [place] per MD order. Pt had cuff leak prior to extubation. Pt able to voice his name. No stridor noted. ? ?Guss Bunde ?04/14/2022, 2:39 PM ? ?

## 2022-04-14 NOTE — Consult Note (Signed)
WOC Nurse Consult Note: ?Reason for Consult: RLE wound ?Patient has been seen last in 1/23. He has dried area of skin on the RLE dorsal and medial ankle; no topical care needed.  ?Patient has BKA on the LLE and right great toe amputation  ?Wound type: ?Unstageable pressure injury: right heel  ?Pressure Injury POA: Yes ?Measurement: 4cm x 4cmx 0cm  ?Wound bed:100% stable black eschar  ?Drainage (amount, consistency, odor) none ?Periwound: intact  ?Dressing procedure/placement/frequency: ?In the setting of DM and PAD and best practice would be to paint right heel wound with betadine to keep stable and offload with Prevalon boot ?Requested Licensed conveyancer to order [employer] ? ?Discussed POC with bedside nurse.  ?Re consult if needed, will not follow at this time. ?Thanks ? Darcie Mellone [facility] MSN, RN,CWOCN, CNS, CWON-AP [phone])  ? ? ?  ?

## 2022-04-14 NOTE — Progress Notes (Signed)
eLink Physician-Brief Progress Note ?Patient Name: Joseph [name] ?DOB: 11-14-1971 ?MRN: [id number] ? ? ?Date of Service ? 04/14/2022  ?HPI/Events of Note ? VBG reviewed.  ?eICU Interventions ? No new orders.  ? ? ? ?  ? ?[name] ?04/14/2022, 5:18 AM ?

## 2022-04-14 NOTE — Progress Notes (Deleted)
I increased her resp rate to 24 given her low pH.  ?

## 2022-04-15 ENCOUNTER — Inpatient Hospital Stay (HOSPITAL_COMMUNITY): Payer: Medicare HMO

## 2022-04-15 DIAGNOSIS — R4189 Other symptoms and signs involving cognitive functions and awareness: Secondary | ICD-10-CM | POA: Diagnosis not present

## 2022-04-15 DIAGNOSIS — G9341 Metabolic encephalopathy: Secondary | ICD-10-CM | POA: Diagnosis not present

## 2022-04-15 LAB — BASIC METABOLIC PANEL
Anion gap: 9 (ref 5–15)
BUN: 24 mg/dL — ABNORMAL HIGH (ref 6–20)
CO2: 26 mmol/L (ref 22–32)
Calcium: 8.6 mg/dL — ABNORMAL LOW (ref 8.9–10.3)
Chloride: 105 mmol/L (ref 98–111)
Creatinine, Ser: 1.75 mg/dL — ABNORMAL HIGH (ref 0.61–1.24)
GFR, Estimated: 47 mL/min — ABNORMAL LOW (ref >60)
Glucose, Bld: 89 mg/dL (ref 70–99)
Potassium: 4.7 mmol/L (ref 3.5–5.1)
Sodium: 140 mmol/L (ref 135–145)

## 2022-04-15 LAB — CBC
HCT: 38.1 % — ABNORMAL LOW (ref 39.0–52.0)
Hemoglobin: 11.3 g/dL — ABNORMAL LOW (ref 13.0–17.0)
MCH: 23.3 pg — ABNORMAL LOW (ref 26.0–34.0)
MCHC: 29.7 g/dL — ABNORMAL LOW (ref 30.0–36.0)
MCV: 78.6 fL — ABNORMAL LOW (ref 80.0–100.0)
Platelets: 378 10*3/uL (ref 150–400)
RBC: 4.85 MIL/uL (ref 4.22–5.81)
RDW: 16.2 % — ABNORMAL HIGH (ref 11.5–15.5)
WBC: 12.3 10*3/uL — ABNORMAL HIGH (ref 4.0–10.5)
nRBC: 0 % (ref 0.0–0.2)

## 2022-04-15 LAB — GLUCOSE, CAPILLARY
Glucose-Capillary: 104 mg/dL — ABNORMAL HIGH (ref 70–99)
Glucose-Capillary: 115 mg/dL — ABNORMAL HIGH (ref 70–99)
Glucose-Capillary: 130 mg/dL — ABNORMAL HIGH (ref 70–99)
Glucose-Capillary: 139 mg/dL — ABNORMAL HIGH (ref 70–99)
Glucose-Capillary: 170 mg/dL — ABNORMAL HIGH (ref 70–99)
Glucose-Capillary: 173 mg/dL — ABNORMAL HIGH (ref 70–99)

## 2022-04-15 LAB — LEGIONELLA PNEUMOPHILA SEROGP 1 UR AG: L. pneumophila Serogp 1 Ur Ag: NEGATIVE

## 2022-04-15 LAB — HEMOGLOBIN A1C
Hgb A1c MFr Bld: 7.1 % — ABNORMAL HIGH (ref 4.8–5.6)
Mean Plasma Glucose: 157.07 mg/dL

## 2022-04-15 LAB — PROCALCITONIN: Procalcitonin: <0.1 ng/mL

## 2022-04-15 MED ORDER — FENOFIBRATE 160 MG PO TABS
160.0000 mg | ORAL_TABLET | Freq: Every day | ORAL | Status: DC
  Administered 2022-04-15 – 2022-04-16 (×2): 160 mg via ORAL
  Filled 2022-04-15 (×2): qty 1

## 2022-04-15 MED ORDER — PANTOPRAZOLE SODIUM 40 MG PO TBEC
40.0000 mg | DELAYED_RELEASE_TABLET | Freq: Every day | ORAL | Status: DC
  Administered 2022-04-15 – 2022-04-16 (×2): 40 mg via ORAL
  Filled 2022-04-15 (×2): qty 1

## 2022-04-15 MED ORDER — ASPIRIN 81 MG PO CHEW
81.0000 mg | CHEWABLE_TABLET | Freq: Every day | ORAL | Status: DC
  Administered 2022-04-15 – 2022-04-16 (×2): 81 mg via ORAL
  Filled 2022-04-15 (×2): qty 1

## 2022-04-15 MED ORDER — PREGABALIN 50 MG PO CAPS
100.0000 mg | ORAL_CAPSULE | Freq: Every day | ORAL | Status: DC
  Administered 2022-04-15: 100 mg via ORAL
  Filled 2022-04-15: qty 2

## 2022-04-15 MED ORDER — CLOPIDOGREL BISULFATE 75 MG PO TABS
75.0000 mg | ORAL_TABLET | Freq: Every day | ORAL | Status: DC
  Administered 2022-04-15 – 2022-04-16 (×2): 75 mg via ORAL
  Filled 2022-04-15 (×2): qty 1

## 2022-04-15 MED ORDER — INSULIN ASPART 100 UNIT/ML IJ SOLN
0.0000 [IU] | Freq: Three times a day (TID) | INTRAMUSCULAR | Status: DC
  Administered 2022-04-15: 2 [IU] via SUBCUTANEOUS
  Administered 2022-04-15: 1 [IU] via SUBCUTANEOUS
  Administered 2022-04-16: 2 [IU] via SUBCUTANEOUS

## 2022-04-15 MED ORDER — DOCUSATE SODIUM 100 MG PO CAPS: 100.0000 mg | ORAL_CAPSULE | Freq: Two times a day (BID) | ORAL | Status: DC | PRN

## 2022-04-15 MED ORDER — ATORVASTATIN CALCIUM 80 MG PO TABS
80.0000 mg | ORAL_TABLET | Freq: Every day | ORAL | Status: DC
  Administered 2022-04-15 – 2022-04-16 (×2): 80 mg via ORAL
  Filled 2022-04-15 (×2): qty 1

## 2022-04-15 MED ORDER — OXYCODONE HCL 5 MG PO TABS
10.0000 mg | ORAL_TABLET | ORAL | Status: DC | PRN
  Administered 2022-04-15 – 2022-04-16 (×5): 10 mg via ORAL
  Filled 2022-04-15 (×5): qty 2

## 2022-04-15 MED ORDER — POLYETHYLENE GLYCOL 3350 17 G PO PACK
17.0000 g | PACK | Freq: Every day | ORAL | Status: DC
Start: 2022-04-15 — End: 2022-04-16
  Administered 2022-04-15 – 2022-04-16 (×2): 17 g via ORAL
  Filled 2022-04-15 (×2): qty 1

## 2022-04-15 MED ORDER — ESCITALOPRAM OXALATE 10 MG PO TABS
10.0000 mg | ORAL_TABLET | Freq: Every day | ORAL | Status: DC
  Administered 2022-04-15 – 2022-04-16 (×2): 10 mg via ORAL
  Filled 2022-04-15 (×2): qty 1

## 2022-04-15 MED ORDER — DOCUSATE SODIUM 100 MG PO CAPS
100.0000 mg | ORAL_CAPSULE | Freq: Two times a day (BID) | ORAL | Status: DC
  Administered 2022-04-15 – 2022-04-16 (×3): 100 mg via ORAL
  Filled 2022-04-15 (×3): qty 1

## 2022-04-15 NOTE — Plan of Care (Signed)
  Problem: Education: Goal: Knowledge of General Education information will improve Description: Including pain rating scale, medication(s)/side effects and non-pharmacologic comfort measures Outcome: Progressing   Problem: Clinical Measurements: Goal: Ability to maintain clinical measurements within normal limits will improve Outcome: Progressing Goal: Will remain free from infection Outcome: Progressing   Problem: Activity: Goal: Risk for activity intolerance will decrease Outcome: Progressing   Problem: Nutrition: Goal: Adequate nutrition will be maintained Outcome: Progressing   Problem: Pain Managment: Goal: General experience of comfort will improve Outcome: Progressing   Problem: Safety: Goal: Ability to remain free from injury will improve Outcome: Progressing   

## 2022-04-15 NOTE — Progress Notes (Signed)
Subjective: ?Describes this AM, that he vomited followed by SOB which promted the admission.  ? ?Exam: ?Vitals:  ? 04/15/22 0840 04/15/22 0900  ?BP:  119/76  ?Pulse: 88 87  ?Resp: 19 19  ?Temp:    ?SpO2: 93% 92%  ? ?Gen: In bed, intubated ?Limited by sedation(on precedex).  ? ?Neuro: ?MS: opens eyes to noxious stimulation ?AL:PFXT midline, slightly dysconjugate, but conjugate on arousal, blinks to eyelid stim bilaterally ?Motor: spontaneous movement in BUE, grimaces on right, but doe snot move much to pain on either side.  ?Sensory:as above.  ? ? ?Pertinent Labs: ?TSH 0.787 ?B12 175 ?Ammonia 26 ?UDS + for benzos ? ? ?Impression: 51 yo M presenting with lethargy requiring intubation. He is on multiple sedating medications including lyrica(150mg  qam, 300 qpm), Oxycondone(15mg  5xday), xanax(1mg  daily prn), phenergan, meclizine(unclear how often he takes PRNs). He is not bothered by his daytime sleepiness, and I think it was unlikely to have contributed.  ? ?Recommendations: ?1) Can restart home medications.  ?2) Please call with further questions or concerns.  ? ?Ritta Slot, MD ?[facility] ?[phone] ? ?If 7pm- 7am, please page neurology on call as listed in AMION. ? ?

## 2022-04-15 NOTE — Consult Note (Signed)
WOC Nurse Consult Note: ?Reason for Consult: blister left stump ?Planning for prothesis soon ?Wound type: serous filled blister ?Pressure Injury POA: NA ?Measurement:3cm x 2cm x 0cm  ?Wound bed: intact serous filled blister ?Drainage (amount, consistency, odor) none ?Periwound:mild erythema  ?No new onset of pain ?Dressing procedure/placement/frequency: ?Single layer of xeroform gauze, top with foam. Change every other day.  ? ? ? ?  ?

## 2022-04-15 NOTE — Progress Notes (Signed)
?PROGRESS NOTE ? ? ? ?Lankford Gutzmer Beirne  XFG:[id number] DOB: September 26, 1971 DOA: 04/13/2022 ?PCP: Marva Panda, NP  ? ? ?Brief Narrative:  ? ?Joseph [name] is a 51 year old male with past medical history significant for CAD, type 2 diabetes mellitus, HTN, history of MI, history of pulmonary embolism, PVD s/p right great toe amputation/left BKA, ascending aortic aneurysm, COPD, anxiety/depression, chronic diastolic congestive heart failure, OSA, morbid obesity, chronic pain, chronic ulcer right heel/midfoot who [facility] ED on 4/25 via EMS for altered mental status.  Patient was found unresponsive in his bed with reported slurring of speech.  Spouse not aware of any medications he took prior to this episode.  Reportedly had decreased appetite for several days.  On EMS arrival, reported GCS of 10 with patient only responding to pain and was placed on supplemental oxygen.  Glucose was found to be within normal limits.  No pinpoint pupils noted prior to arrival, no trial of Narcan prior to ED presentation.  On arrival to the ED, patient's GCS was noted to be 5 and given inability to protect airway, patient was intubated and placed on ventilatory support.  Patient was admitted to the [facility] service.  Neurology was consulted.  ABG pH 7.45, P O2 35, PCO2 39.0.  Sodium 140, potassium 4.6, chloride 107, CO2 25, glucose 123, BUN 26, creatinine 1.42.  Magnesium 1.2.  AST 14, ALT 10.  WBC 14.6, hemoglobin 10.8, platelets 334.  Urinalysis unrevealing.  EtOH level less than 10.  Salicylate level less than 7.0.  UDS positive for benzos and THC.  CT head without contrast with no acute intracranial findings.  CTA chest with no evidence of pulmonary embolism, multifocal patchy opacities right lower lobe predominant.  CT angiogram gram head/neck with no acute intracranial process.  CT venogram unrevealing.  MR brain without contrast with no acute intracranial process.  High suspicion given multiple sedating medications he takes  at baseline as etiology of his presenting symptoms.  EEG with no seizure or epileptiform activity.  Patient was successfully extubated on 04/14/2022.  Patient was transferred to the hospital service on 04/15/2022. ? ?Assessment & Plan: ?  ? ?Acute metabolic encephalopathy ?Patient presenting to the ED via EMS after being found by family unresponsive.  Patient was subsequently intubated on ED arrival due to low GCS of 5 and inability to protect airway.  Patient underwent multiple imaging modalities with CT head, CT angiogram head/neck, MRI brain, CT venogram, EEG with no significant findings other than pneumonia.  UDS positive for THC and benzos.  Patient on multiple sedating medications at baseline including Lyrica, oxycodone, Xanax, Phenergan, meclizine that is suspicious as the etiology to his presenting symptoms. ?--Appears mental status now back towards baseline ?--Continue antibiotics for pneumonia, supportive care ? ?Acute hypoxic respiratory failure, POA ?Community-acquired pneumonia ?CT angiogram chest with multifocal patchy opacities right lower lobe predominant.  Patient with elevated WBC count of 14.3 on arrival.  Given inability to protect airway, patient was intubated on admission, successfully extubated on 04/14/2022. ?--Azithromycin 500 mg daily, plan 3-day course ?--Ceftriaxone 1 g IV every 24 hours, plan 5-day course ?--Supplemental oxygen weaned off this morning ? ?Right ethmoid sinusitis ?--Continue antibiotics as above ? ?Acute renal failure ?Baseline creatinine 1.1. ?--Cr ?--bladder scan today to ensure not retaining urine; Foley catheter was placed in ICU, now discontinued ?--Avoid nephrotoxins, renal dose all medications ?--BMP daily ? ?Chronic ulcer right heel/midfoot ?Left BKA blister ?Pressure Injury 04/14/22 Heel Right;Anterior Unstageable - Full thickness tissue loss in which  the base of the injury is covered by slough (yellow, tan, gray, green or brown) and/or eschar (tan, brown or black)  in the wound bed. (Active)  ?04/14/22 0200  ?Location: Heel  ?Location Orientation: Right;Anterior  ?Staging: Unstageable - Full thickness tissue loss in which the base of the injury is covered by slough (yellow, tan, gray, green or brown) and/or eschar (tan, brown or black) in the wound bed.  ?Wound Description (Comments):   ?Present on Admission: Yes  ?--Wound care following ? ?Essential hypertension ?Home regimen includes carvedilol 3.125 mg p.o. twice daily, ranolazine 5 mg p.o. twice daily. ?--Continue to hold home meds in the setting of borderline hypotension ? ?Hyperlipidemia ?--Fenofibrate 160 mg p.o. daily ?--Atorvastatin 80 mg p.o. daily ?--Plan to resume Vascepa on discharge ? ?Type 2 diabetes mellitus ?Home regimen includes metformin 1000 mg p.o. twice daily, Victoza 1.8 mg injected daily, Lantus 45 units [place] daily, NovoLog 6 units TIDAC.  Hemoglobin A1c 7.1, well controlled. ?--Holding home insulin, oral hypoglycemic regimen ?--Sensitive SSI for coverage ?--CBGs qAC/HS ? ?CAD s/p CABG ?Hx MI ?Chronic diastolic congestive heart failure ?Home regimen includes ranolazine 500 mg p.o. twice daily, carvedilol 3.125 mg p.o. twice daily, furosemide 40 mg p.o. daily ?--Continue aspirin 81 mg p.o. daily, Plavix 75 mg p.o. daily, statin ?--Holding home carvedilol 3.125 mg p.o. twice daily and furosemide for borderline hypotension ? ?Ascending aortic aneurysm ?--Outpatient follow-up ? ?OSA ?--Has not been compliant given nonoperative CPAP unit since December ? ?Hx COPD ?Reports quit smoking 2 years ago after having CABG. ? ?Depression/anxiety ?Home regimen includes Wellbutrin 150 mg p.o. twice daily, Lexapro 10 mg p.o. daily ?--Restart Lexapro 10 mg p.o. daily ?--Continue Wellbutrin reduced dose 100 mg p.o. twice daily ? ?Chronic pain syndrome ?Home regimen includes Lyrica 300 mg p.o. qAM, 150 mg p.o. qPM, oxycodone 15 mg 5 times daily ?--Lyrica reduced dose of 100 mg p.o. nightly ?--Oxycodone reduced dose 10 mg  every 4 hours as needed moderate/severe pain ? ?Physical deconditioning ?Patient with left BKA, currently does not have prosthetic device.  At baseline is able to "hop" with a walker and utilizes a wheelchair in his home, although it is difficult as his home residence is not wheelchair accessible. ?--PT/OT evaluation ? ?Morbid obesity ?Body mass index is 42.35 kg/m?Marland Kitchen  Discussed with patient needs for aggressive lifestyle changes/weight loss as this complicates all facets of care.  Outpatient follow-up with PCP.   ? ?DVT prophylaxis: heparin injection 5,000 Units Start: 04/14/22 1400 ? ?  Code Status: Full Code ?Family Communication: No family present at bedside this morning ? ?Disposition Plan:  ?Level of care: Med-Surg ?Status is: Inpatient ?Remains inpatient appropriate because: Pending PT/OT evaluation, dissipate discharge home likely 4/28 with home health services ?  ? ?Consultants:  ?PCCM -signed off 4/27 ?Neurology ? ?Procedures:  ?Intubated 4/25 ?Extubated 4/26 ?TTE ?EEG ?Foley catheter removed 4/26 ? ?Antimicrobials:  ?Azithromycin 4/25>> ?Ceftriaxone 4/25>> ? ? ?Subjective: ?Patient seen examined bedside, resting currently.  RN present.  No family present.  Asking for his pain regimen to be increased.  Continues with poor oral intake with both food and water.  Recall events leading to hospitalization.  States "surprised that he was tied down yesterday in the ICU with the tube in his throat".  Also reports "feels like the muscles in my legs are being torn".  No other specific complaints or concerns at this time.  Denies headache, no visual changes, no chest pain, no palpitations, no abdominal pain, no fever/chills/night sweats, no  nausea/vomiting/diarrhea, no focal weakness, no fatigue, no paresthesias.  No acute events overnight per nursing staff.  Stable for transfer to floor ? ?Objective: ?Vitals:  ? 04/15/22 0745 04/15/22 0800 04/15/22 0840 04/15/22 0900  ?BP:  117/85  119/76  ?Pulse: 80 75 88 87  ?Resp:   19 19 19   ?Temp:  97.9 ?F (36.6 ?C)    ?TempSrc:  Oral    ?SpO2: 95% 94% 93% 92%  ?Weight:      ?Height:      ? ? ?Intake/Output Summary (Last 24 hours) at 04/15/2022 1001 ?Last data filed at 04/15/2022 09

## 2022-04-15 NOTE — Progress Notes (Signed)
PT Cancellation Note ? ?Patient Details ?Name: Joseph [name] ?MRN: [id number] ?DOB: 04-Nov-1971 ? ? ?Cancelled Treatment:    Reason Eval/Treat Not Completed: Pain limiting ability to participate. Pt reporting significant pain in L residual limb, declines mobility at this time. Pt also reports he is to only weight bear through R foot for transfers only per Dr. [name], PT seeking clarification on WB status. ? ? ?Arlyss Gandy ?04/15/2022, 10:54 AM ?

## 2022-04-16 DIAGNOSIS — G9341 Metabolic encephalopathy: Secondary | ICD-10-CM | POA: Diagnosis not present

## 2022-04-16 LAB — VITAMIN B1: Vitamin B1 (Thiamine): 153.2 nmol/L (ref 66.5–200.0)

## 2022-04-16 LAB — BASIC METABOLIC PANEL
Anion gap: 6 (ref 5–15)
BUN: 26 mg/dL — ABNORMAL HIGH (ref 6–20)
CO2: 26 mmol/L (ref 22–32)
Calcium: 8.3 mg/dL — ABNORMAL LOW (ref 8.9–10.3)
Chloride: 104 mmol/L (ref 98–111)
Creatinine, Ser: 1.53 mg/dL — ABNORMAL HIGH (ref 0.61–1.24)
GFR, Estimated: 55 mL/min — ABNORMAL LOW (ref >60)
Glucose, Bld: 192 mg/dL — ABNORMAL HIGH (ref 70–99)
Potassium: 4.6 mmol/L (ref 3.5–5.1)
Sodium: 136 mmol/L (ref 135–145)

## 2022-04-16 LAB — CBC
HCT: 35 % — ABNORMAL LOW (ref 39.0–52.0)
Hemoglobin: 10.5 g/dL — ABNORMAL LOW (ref 13.0–17.0)
MCH: 23.4 pg — ABNORMAL LOW (ref 26.0–34.0)
MCHC: 30 g/dL (ref 30.0–36.0)
MCV: 78 fL — ABNORMAL LOW (ref 80.0–100.0)
Platelets: 315 10*3/uL (ref 150–400)
RBC: 4.49 MIL/uL (ref 4.22–5.81)
RDW: 15.9 % — ABNORMAL HIGH (ref 11.5–15.5)
WBC: 9.8 10*3/uL (ref 4.0–10.5)
nRBC: 0 % (ref 0.0–0.2)

## 2022-04-16 LAB — MAGNESIUM: Magnesium: 1.7 mg/dL (ref 1.7–2.4)

## 2022-04-16 LAB — GLUCOSE, CAPILLARY: Glucose-Capillary: 196 mg/dL — ABNORMAL HIGH (ref 70–99)

## 2022-04-16 MED ORDER — CEFDINIR 300 MG PO CAPS: 300.0000 mg | ORAL_CAPSULE | Freq: Two times a day (BID) | ORAL | 0 refills | Status: AC

## 2022-04-16 MED ORDER — AZITHROMYCIN 500 MG PO TABS: 500.0000 mg | ORAL_TABLET | Freq: Every day | ORAL | 0 refills | Status: AC

## 2022-04-16 NOTE — Discharge Summary (Signed)
?Physician Discharge Summary  ?Joseph [name] [id number] DOB: Dec 31, 1970 DOA: 04/13/2022 ? ?PCP: [name], NP ? ?Admit date: 04/13/2022 ?Discharge date: 04/16/2022 ? ?Admitted From: Home ?Disposition: Home ? ?Recommendations for Outpatient Follow-up:  ?Follow up with PCP in 1-2 weeks ?Complete continue antibiotics to complete course for pneumonia ?Needs new CPAP machine ?Recommend repeat BMP in 1 week to evaluate creatinine ? ?Home Health: Declined home health ?Equipment/Devices: Has wheelchair/walker at home ? ?Discharge Condition: Stable ?CODE STATUS: Full code ?Diet recommendation: Heart healthy/consistent carbohydrate diet ? ?History of present illness: ? ?Joseph [name] is a 51 year old male with past medical history significant for CAD, type 2 diabetes mellitus, HTN, history of MI, history of pulmonary embolism, PVD s/p right great toe amputation/left BKA, ascending aortic aneurysm, COPD, anxiety/depression, chronic diastolic congestive heart failure, OSA, morbid obesity, chronic pain, chronic ulcer right heel/midfoot who presented to [facility] ED on 4/25 via EMS for altered mental status.  Patient was found unresponsive in his bed with reported slurring of speech.  Spouse not aware of any medications he took prior to this episode.  Reportedly had decreased appetite for several days.  On EMS arrival, reported GCS of 10 with patient only responding to pain and was placed on supplemental oxygen.  Glucose was found to be within normal limits.  No pinpoint pupils noted prior to arrival, no trial of Narcan prior to ED presentation.  On arrival to the ED, patient's GCS was noted to be 5 and given inability to protect airway, patient was intubated and placed on ventilatory support.  Patient was admitted to the [facility] service.  Neurology was consulted.  ABG pH 7.45, P O2 35, PCO2 39.0.  Sodium 140, potassium 4.6, chloride 107, CO2 25, glucose 123, BUN 26, creatinine 1.42.  Magnesium 1.2.  AST 14, ALT 10.  WBC  14.6, hemoglobin 10.8, platelets 334.  Urinalysis unrevealing.  EtOH level less than 10.  Salicylate level less than 7.0.  UDS positive for benzos and THC.  CT head without contrast with no acute intracranial findings.  CTA chest with no evidence of pulmonary embolism, multifocal patchy opacities right lower lobe predominant.  CT angiogram gram head/neck with no acute intracranial process.  CT venogram unrevealing.  MR brain without contrast with no acute intracranial process.  High suspicion given multiple sedating medications he takes at baseline as etiology of his presenting symptoms.  EEG with no seizure or epileptiform activity.  Patient was successfully extubated on 04/14/2022.  Patient was transferred to the hospital service on 04/15/2022. ? ?Hospital course: ? ?Acute metabolic encephalopathy ?Patient presenting to the ED via EMS after being found by family unresponsive.  Patient was subsequently intubated on ED arrival due to low GCS of 5 and inability to protect airway.  Patient underwent multiple imaging modalities with CT head, CT angiogram head/neck, MRI brain, CT venogram, EEG with no significant findings other than pneumonia.  UDS positive for THC and benzos.  Patient on multiple sedating medications at baseline including Lyrica, oxycodone, Xanax, Phenergan, meclizine that is suspicious as the etiology to his presenting symptoms versus community-acquired pneumonia.  Mental status now back to baseline. ? ?Acute hypoxic respiratory failure, POA ?Community-acquired pneumonia ?CT angiogram chest with multifocal patchy opacities right lower lobe predominant.  Patient with elevated WBC count of 14.3 on arrival.  Given inability to protect airway, patient was intubated on admission, successfully extubated on 04/14/2022.  Was treated with azithromycin and ceftriaxone during hospitalization and will continue azithromycin and cefdinir on discharge complete antibiotic course.  Supplemental oxygen was weaned off.   ?   ?Right ethmoid sinusitis ?Continue antibiotics as above treatment course ?  ?Acute renal failure ?Baseline creatinine 1.1.  Creatinine trended up to a high of 1.75.  Creatinine improved to 1.53 at time of discharge.  Recommend repeat BMP 1 week. ?  ?Chronic ulcer right heel/midfoot ?Left BKA blister ?Pressure Injury 04/14/22 Heel Right;Anterior Unstageable - Full thickness tissue loss in which the base of the injury is covered by slough (yellow, tan, gray, green or brown) and/or eschar (tan, brown or black) in the wound bed. (Active)  ?04/14/22 0200  ?Location: Heel  ?Location Orientation: Right;Anterior  ?Staging: Unstageable - Full thickness tissue loss in which the base of the injury is covered by slough (yellow, tan, gray, green or brown) and/or eschar (tan, brown or black) in the wound bed.  ?Wound Description (Comments):   ?Present on Admission: Yes  ?--Wound care was consulted and followed during hospital course.  Outpatient follow-up with orthopedics, Dr. Lajoyce Cornersuda ?  ?Essential hypertension ?Continue home carvedilol 3.125 mg p.o. twice daily, ranolazine 5 mg p.o. twice daily. ?  ?Hyperlipidemia ?Continue home fenofibrate 160 mg p.o. daily, Atorvastatin 80 mg p.o. daily, and Vascepa ?  ?Type 2 diabetes mellitus ?Home regimen includes metformin 1000 mg p.o. twice daily, Victoza 1.8 mg injected daily, Lantus 45 units [place] daily, NovoLog 6 units TIDAC.  Hemoglobin A1c 7.1, well controlled. ?  ?CAD s/p CABG ?Hx MI ?Chronic diastolic congestive heart failure ?Home regimen includes ranolazine 500 mg p.o. twice daily, carvedilol 3.125 mg p.o. twice daily, furosemide 40 mg p.o. daily, aspirin, Plavix.  Outpatient follow with cardiology. ?  ?Ascending aortic aneurysm ?Outpatient follow-up ?  ?OSA ?Has not been compliant given nonoperative CPAP unit since December.  Outpatient follow-up PCP. ?  ?Hx COPD ?Reports quit smoking 2 years ago after having CABG. ?  ?Depression/anxiety ?Home regimen includes Wellbutrin 150 mg p.o.  twice daily, Lexapro 10 mg p.o. daily ?  ?Chronic pain syndrome ?Home regimen includes Lyrica 300 mg p.o. qAM, 150 mg p.o. qPM, oxycodone 15 mg 5 times daily ?  ?Physical deconditioning ?Patient with left BKA, currently does not have prosthetic device.  At baseline is able to "hop" with a walker and utilizes a wheelchair in his home, although it is difficult as his home residence is not wheelchair accessible. ?  ?Morbid obesity ?Body mass index is 42.35 kg/m?Marland Kitchen.  Discussed with patient needs for aggressive lifestyle changes/weight loss as this complicates all facets of care.  Outpatient follow-up with PCP.   ? ? ? ? ? ?Discharge Diagnoses:  ?Principal Problem: ?  Acute metabolic encephalopathy ?Active Problems: ?  Hyperlipidemia ?  CAD -S/P PCI 11/02/19  ?  History of pulmonary embolus (PE) ?  OSA (obstructive sleep apnea) ?  Insulin dependent type 2 diabetes mellitus (HCC) ?  Chronic ulcer of great toe of right foot (HCC) ?  Anxiety disorder ?  COPD (chronic obstructive pulmonary disease) (HCC) ?  Obesity, Class III, BMI 40-49.9 (morbid obesity) (HCC) ?  Sinusitis ?  Smoker ?  GERD (gastroesophageal reflux disease) ?  S/P CABG x 4 ?  PAD (peripheral artery disease) (HCC) ?  Essential hypertension ?  DM (diabetes mellitus) with peripheral vascular complication (HCC) ?  Aneurysm of ascending aorta (HCC) ?  Bipolar disorder with depression (HCC) ?  Below-knee amputation of left lower extremity (HCC) ?  AMS (altered mental status) ?  Acute respiratory failure with hypoxia (HCC) ?  Pneumonia ? ? ? ?Discharge  Instructions ? ?Discharge Instructions   ? ? Call MD for:  difficulty breathing, headache or visual disturbances   Complete by: As directed ?  ? Call MD for:  extreme fatigue   Complete by: As directed ?  ? Call MD for:  persistant dizziness or light-headedness   Complete by: As directed ?  ? Call MD for:  persistant nausea and vomiting   Complete by: As directed ?  ? Call MD for:  severe uncontrolled pain   Complete  by: As directed ?  ? Call MD for:  temperature >100.4   Complete by: As directed ?  ? Diet - low sodium heart healthy   Complete by: As directed ?  ? Discharge wound care:   Complete by: As directed ?  ?

## 2022-04-16 NOTE — Evaluation (Signed)
Physical Therapy Evaluation ?Patient Details ?Name: Joseph [name] ?MRN: [id number] ?DOB: 1971-12-12 ?Today's Date: 04/16/2022 ? ?History of Present Illness ? 51 y.o. male presents to [facility] hospital on 04/13/2022 with AMS, loss of consciousness and vomiting. Pt requires intubation in the ED, extubated 4/26. EEG negative for seizure. PMH includes CAD, MI, DM, L BKA, chronic R heel ulcer, COPD, anxiety, depression.  ?Clinical Impression ? Pt presents to PT with L BKA and chronic R heel ulcer. Per pt he is to limit WB through R foot just to get from bed to wheelchair and wheelchair into bathroom. Pt is able to demonstrate hopping a short distance similar to what he has to manage to reach his wheelchair. Ultimately pt would benefit from home modifications to make his bedroom and restroom wheelchair accessible as this would allow for him to perform stand pivot transfers and reduced weightbearing through his RLE. Pt reports he feels that he would be unable to utilize a scooter to reduce weightbearing. Pt could alternatively utilize a bedside commode in his bedroom to reduce walking if he chooses to.PT recommends discharge home when medically ready.   ?   ? ?Recommendations for follow up therapy are one component of a multi-disciplinary discharge planning process, led by the attending physician.  Recommendations may be updated based on patient status, additional functional criteria and insurance authorization. ? ?Follow Up Recommendations No PT follow up ? ?  ?Assistance Recommended at Discharge PRN  ?Patient can return home with the following ? A little help with walking and/or transfers;A little help with bathing/dressing/bathroom;Assistance with cooking/housework;Assist for transportation;Help with stairs or ramp for entrance ? ?  ?Equipment Recommendations None recommended by PT (pt owns necessary DME)  ?Recommendations for Other Services ?    ?  ?Functional Status Assessment Patient has had a recent decline in their  functional status and demonstrates the ability to make significant improvements in function in a reasonable and predictable amount of time.  ? ?  ?Precautions / Restrictions Precautions ?Precautions: Fall ?Precaution Comments: L BKA blister, R heel ulcer ?Restrictions ?Weight Bearing Restrictions: Yes ?Other Position/Activity Restrictions: assume NWB through L BKA with current blister. Pt reports he is to limited WB as much as possible on R foot, reports being cleared by Dr. Lajoyce Cornersuda to hop from bed to wheelchair in order to make it to bathroom  ? ?  ? ?Mobility ? Bed Mobility ?Overal bed mobility: Modified Independent ?  ?  ?  ?  ?  ?  ?  ?  ? ?Transfers ?Overall transfer level: Needs assistance ?Equipment used: Rolling walker (2 wheels) ?Transfers: Sit to/from Stand ?Sit to Stand: Supervision ?  ?  ?  ?  ?  ?  ?  ? ?Ambulation/Gait ?Ambulation/Gait assistance: Supervision ?Gait Distance (Feet): 8 Feet ?Assistive device: Rolling walker (2 wheels) ?Gait Pattern/deviations:  (hop-to gait) ?Gait velocity: reduced ?Gait velocity interpretation: <1.31 ft/sec, indicative of household ambulator ?  ?General Gait Details: pt hops from bed to recliner, turning to sit, no significant balance deviations noted ? ?Stairs ?  ?  ?  ?  ?  ? ?Wheelchair Mobility ?  ? ?Modified Rankin (Stroke Patients Only) ?  ? ?  ? ?Balance Overall balance assessment: Needs assistance ?Sitting-balance support: No upper extremity supported, Feet supported ?Sitting balance-Leahy Scale: Good ?  ?  ?Standing balance support: Bilateral upper extremity supported, Reliant on assistive device for balance ?Standing balance-Leahy Scale: Poor ?  ?  ?  ?  ?  ?  ?  ?  ?  ?  ?  ?  ?   ? ? ? ?  Pertinent Vitals/Pain Pain Assessment ?Pain Assessment: Faces ?Faces Pain Scale: Hurts little more ?Pain Location: BLE ?Pain Descriptors / Indicators: Grimacing ?Pain Intervention(s): Monitored during session  ? ? ?Home Living Family/patient expects to be discharged to:: Private  residence ?Living Arrangements: Spouse/significant other;Children;Other (Comment) ?Available Help at Discharge: Family;Available 24 hours/day ?Type of Home: House ?Home Access: Ramped entrance ?  ?  ?  ?Home Layout: Multi-level;Able to live on main level with bedroom/bathroom ?Home Equipment: Shower seat;Cane - single point;Rolling Walker (2 wheels) ?   ?  ?Prior Function Prior Level of Function : Needs assist ?  ?  ?  ?  ?  ?  ?Mobility Comments: pt reports he hops from bed to wheelchair <10' and then from bathroom door to commode. Also mentions going out into the community some but remaining in his vehicle ?ADLs Comments: takes a sponge bath ?  ? ? ?Hand Dominance  ? Dominant Hand: Right ? ?  ?Extremity/Trunk Assessment  ? Upper Extremity Assessment ?Upper Extremity Assessment: Defer to OT evaluation ?  ? ?Lower Extremity Assessment ?Lower Extremity Assessment: RLE deficits/detail;LLE deficits/detail ?RLE Deficits / Details: WFL, black eschar over R heel ulcer ?LLE Deficits / Details: blister on L residual limb, ROM WFL ?  ? ?Cervical / Trunk Assessment ?Cervical / Trunk Assessment: Other exceptions ?Cervical / Trunk Exceptions: excess body habitus  ?Communication  ? Communication: No difficulties  ?Cognition Arousal/Alertness: Awake/alert ?Behavior During Therapy: [facility] for tasks assessed/performed ?Overall Cognitive Status: Within Functional Limits for tasks assessed ?  ?  ?  ?  ?  ?  ?  ?  ?  ?  ?  ?  ?  ?  ?  ?  ?General Comments: pt is aware of WB precautions but does not consistently abide by them ?  ?  ? ?  ?General Comments General comments (skin integrity, edema, etc.): VSS on RA ? ?  ?Exercises    ? ?Assessment/Plan  ?  ?PT Assessment Patient needs continued PT services  ?PT Problem List Decreased activity tolerance;Decreased mobility;Decreased knowledge of precautions;Pain ? ?   ?  ?PT Treatment Interventions Gait training;DME instruction;Balance training;Neuromuscular re-education;Patient/family  education;Therapeutic activities;Wheelchair mobility training   ? ?PT Goals (Current goals can be found in the Care Plan section)  ?Acute Rehab PT Goals ?Patient Stated Goal: to go home ?PT Goal Formulation: With patient ?Time For Goal Achievement: 04/30/22 ?Potential to Achieve Goals: Good ?Additional Goals ?Additional Goal #1: Pt will adhere to WB precautions, limiting WB through R heel as much as possible ? ?  ?Frequency Min 2X/week ?  ? ? ?Co-evaluation   ?  ?  ?  ?  ? ? ?  ?AM-PAC PT "6 Clicks" Mobility  ?Outcome Measure Help needed turning from your back to your side while in a flat bed without using bedrails?: None ?Help needed moving from lying on your back to sitting on the side of a flat bed without using bedrails?: None ?Help needed moving to and from a bed to a chair (including a wheelchair)?: A Little ?Help needed standing up from a chair using your arms (e.g., wheelchair or bedside chair)?: A Little ?Help needed to walk in hospital room?: Total (limited WB through R heel) ?Help needed climbing 3-5 steps with a railing? : Total ?6 Click Score: 16 ? ?  ?End of Session   ?Activity Tolerance: Patient tolerated treatment well ?Patient left: in chair;with call bell/phone within reach;with family/visitor present ?Nurse Communication: Mobility status ?PT Visit Diagnosis: Other abnormalities of gait  and mobility (R26.89);Pain ?Pain - Right/Left: Left ?Pain - part of body: Leg ?  ? ?Time: 1050-1058 ?PT Time Calculation (min) (ACUTE ONLY): 8 min ? ? ?Charges:   PT Evaluation ?$PT Eval Low Complexity: 1 Low ?  ?  ?   ? ? ?Arlyss Gandy, PT, DPT ?Acute Rehabilitation ?Pager: [phone] ?Office [phone] ? ? ?Arlyss Gandy ?04/16/2022, 11:36 AM ? ?

## 2022-04-16 NOTE — Evaluation (Signed)
Occupational Therapy Evaluation ?Patient Details ?Name: Joseph [name] ?MRN: [id number] ?DOB: [birth year] ?Today's Date: 04/16/2022 ? ? ?History of Present Illness 51 y.o. male presents to [facility] hospital on 04/13/2022 with AMS, loss of consciousness and vomiting. Pt requires intubation in the ED, extubated 4/26. EEG negative for seizure. PMH includes CAD, MI, DM, L BKA, chronic R heel ulcer, COPD, anxiety, depression.  ? ?Clinical Impression ?  ?PTA, pt was living with his wife and son and reports he performed BADLs and used RW to hop around home. Pt performing at baseline with Supervision for safety. Pt with poor safety awareness. Recommend dc to home once medically stable. Planning for dc later today - all acute OT needs met.  ?   ? ?Recommendations for follow up therapy are one component of a multi-disciplinary discharge planning process, led by the attending physician.  Recommendations may be updated based on patient status, additional functional criteria and insurance authorization.  ? ?Follow Up Recommendations ? No OT follow up  ?  ?Assistance Recommended at Discharge Frequent or constant Supervision/Assistance  ?Patient can return home with the following   ? ?  ?Functional Status Assessment ? Patient has had a recent decline in their functional status and demonstrates the ability to make significant improvements in function in a reasonable and predictable amount of time.  ?Equipment Recommendations ? None recommended by OT  ?  ?Recommendations for Other Services PT consult ? ? ?  ?Precautions / Restrictions Precautions ?Precautions: Fall ?Precaution Comments: L BKA blister, R heel ulcer ?Restrictions ?Weight Bearing Restrictions: Yes ?Other Position/Activity Restrictions: assume NWB through L BKA with current blister. Pt reports he is to limited WB as much as possible on R foot, reports being cleared by Dr. Sharol Given to hop from bed to wheelchair in order to make it to bathroom  ? ?  ? ?Mobility Bed Mobility ?Overal bed  mobility: Modified Independent ?  ?  ?  ?  ?  ?  ?  ?  ? ?Transfers ?Overall transfer level: Needs assistance ?Equipment used: Rolling walker (2 wheels) ?Transfers: Sit to/from Stand ?Sit to Stand: Supervision ?  ?  ?  ?  ?  ?  ?  ? ?  ?Balance Overall balance assessment: Needs assistance ?Sitting-balance support: No upper extremity supported, Feet supported ?Sitting balance-Leahy Scale: Good ?  ?  ?Standing balance support: Bilateral upper extremity supported, Reliant on assistive device for balance ?Standing balance-Leahy Scale: Poor ?  ?  ?  ?  ?  ?  ?  ?  ?  ?  ?  ?  ?   ? ?ADL either performed or assessed with clinical judgement  ? ?ADL Overall ADL's : At baseline ?  ?  ?  ?  ?  ?  ?  ?  ?  ?  ?  ?  ?  ?  ?  ?  ?  ?  ?  ?General ADL Comments: Pt performing at baseline. donning underwear and pants while lateral leaning. performing sit<>stand with Supervision. Poor safety awareness but performing as he would at home  ? ? ? ?Vision   ?   ?   ?Perception   ?  ?Praxis   ?  ? ?Pertinent Vitals/Pain Pain Assessment ?Pain Assessment: Faces ?Faces Pain Scale: Hurts little more ?Pain Location: BLE ?Pain Descriptors / Indicators: Grimacing ?Pain Intervention(s): Monitored during session, Limited activity within patient's tolerance, Repositioned  ? ? ? ?Hand Dominance Right ?  ?Extremity/Trunk Assessment Upper Extremity Assessment ?Upper Extremity  Assessment: Overall WFL for tasks assessed ?  ?Lower Extremity Assessment ?Lower Extremity Assessment: RLE deficits/detail;LLE deficits/detail ?RLE Deficits / Details: WFL, black eschar over R heel ulcer ?LLE Deficits / Details: blister on L residual limb, ROM WFL ?  ?Cervical / Trunk Assessment ?Cervical / Trunk Assessment: Other exceptions ?Cervical / Trunk Exceptions: excess body habitus ?  ?Communication Communication ?Communication: No difficulties ?  ?Cognition Arousal/Alertness: Awake/alert ?Behavior During Therapy: [facility] for tasks assessed/performed ?Overall Cognitive  Status: Within Functional Limits for tasks assessed ?  ?  ?  ?  ?  ?  ?  ?  ?  ?  ?  ?  ?  ?  ?  ?  ?General Comments: pt is aware of WB precautions but does not consistently abide by them ?  ?  ?General Comments  Wife and son present. VSS on RA ? ?  ?Exercises   ?  ?Shoulder Instructions    ? ? ?Home Living Family/patient expects to be discharged to:: Private residence ?Living Arrangements: Spouse/significant other;Children;Other (Comment) ?Available Help at Discharge: Family;Available 24 hours/day ?Type of Home: House ?Home Access: Ramped entrance ?  ?  ?Home Layout: Multi-level;Able to live on main level with bedroom/bathroom ?  ?  ?Bathroom Shower/Tub: Tub/shower unit ?  ?Bathroom Toilet: Standard ?Bathroom Accessibility: No ?  ?Home Equipment: Shower seat;Cane - single point;Rolling Walker (2 wheels) ?  ?  ?  ? ?  ?Prior Functioning/Environment Prior Level of Function : Needs assist ?  ?  ?  ?  ?  ?  ?Mobility Comments: pt reports he hops from bed to wheelchair <10' and then from bathroom door to commode. Also mentions going out into the community some but remaining in his vehicle ?ADLs Comments: takes a sponge bath ?  ? ?  ?  ?OT Problem List: Decreased activity tolerance;Decreased range of motion;Impaired balance (sitting and/or standing) ?  ?   ?OT Treatment/Interventions:    ?  ?OT Goals(Current goals can be found in the care plan section) Acute Rehab OT Goals ?Patient Stated Goal: Go home now ?OT Goal Formulation: All assessment and education complete, DC therapy  ?OT Frequency:   ?  ? ?Co-evaluation   ?  ?  ?  ?  ? ?  ?AM-PAC OT "6 Clicks" Daily Activity     ?Outcome Measure Help from another person eating meals?: A Little ?Help from another person taking care of personal grooming?: A Little ?Help from another person toileting, which includes using toliet, bedpan, or urinal?: A Little ?Help from another person bathing (including washing, rinsing, drying)?: A Little ?Help from another person to put on and  taking off regular upper body clothing?: A Little ?Help from another person to put on and taking off regular lower body clothing?: A Little ?6 Click Score: 18 ?  ?End of Session Equipment Utilized During Treatment: Rolling walker (2 wheels) ?Nurse Communication: Mobility status ? ?Activity Tolerance: Patient tolerated treatment well ?Patient left: in chair;with call bell/phone within reach;with family/visitor present ? ?OT Visit Diagnosis: Other abnormalities of gait and mobility (R26.89);Muscle weakness (generalized) (M62.81)  ?              ?Time: 9021-1155 ?OT Time Calculation (min): 18 min ?Charges:  OT General Charges ?$OT Visit: 1 Visit ?OT Evaluation ?$OT Eval Moderate Complexity: 1 Mod ? ?Galen Russman MSOT, OTR/L ?Acute Rehab ?Pager: [phone] ?Office: [phone] ? ?Anya Murphey M Kaelea Gathright ?04/16/2022, 12:28 PM ?

## 2022-04-18 LAB — CULTURE, BLOOD (ROUTINE X 2)
Culture: NO GROWTH
Culture: NO GROWTH

## 2022-04-29 ENCOUNTER — Telehealth: Payer: Self-pay | Admitting: Family

## 2022-04-29 NOTE — Telephone Encounter (Signed)
Patient's wife called. Says the blister popped. Says Denny Peon told them to call if that happen and get in ASAP. Her call back number is [phone] ?

## 2022-04-29 NOTE — Telephone Encounter (Signed)
Called pt and sw wife. Made an appt for tomorrow at 9:45 ?

## 2022-04-30 ENCOUNTER — Ambulatory Visit: Payer: Medicare HMO | Admitting: Family

## 2022-05-03 ENCOUNTER — Other Ambulatory Visit: Payer: Self-pay | Admitting: Cardiovascular Disease

## 2022-05-03 NOTE — Telephone Encounter (Signed)
Rx(s) sent to pharmacy electronically.  

## 2022-05-05 ENCOUNTER — Encounter: Payer: Self-pay | Admitting: Family

## 2022-05-05 ENCOUNTER — Ambulatory Visit (INDEPENDENT_AMBULATORY_CARE_PROVIDER_SITE_OTHER): Payer: Medicare HMO | Admitting: Family

## 2022-05-05 DIAGNOSIS — Z89512 Acquired absence of left leg below knee: Secondary | ICD-10-CM

## 2022-05-05 DIAGNOSIS — L89600 Pressure ulcer of unspecified heel, unstageable: Secondary | ICD-10-CM

## 2022-05-05 NOTE — Progress Notes (Signed)
? ?Office Visit Note ?  ?Patient: [name]           ?Date of Birth: 01/23/1971           ?MRN: [id number] ?Visit Date: 05/05/2022 ?             ?Requested by: Marva Panda, NP ?[address] ?[place],  [name] [address] ?PCP: Marva Panda, NP ? ?Chief Complaint  ?Patient presents with  ? Right Heel - Follow-up  ?  ulcer  ? Left Leg - Follow-up  ?  Hx BKA   ? ? ? ? ?HPI: ?The patient is a 51 year old gentleman seen today status post left below-knee amputation.  We are also following a decubitus ulcer of his right heel. ? ?Radiographs of the right foot and heel from hospitalization reviewed there is no sign of osteomyelitis ? ?He is also been seen by vascular this year in January he had ABIs performed he has mild arterial disease on the right ? ?Assessment & Plan: ?Visit Diagnoses: No diagnosis found. ? ?Plan: Continue with dry dressing changes to the right heel.  He will continue with his home health as well they are doing wound care on the right heel once weekly.  The left residual limb is healed he may proceed with prosthesis set up ? ?Follow-Up Instructions: Return in about 3 months (around 08/05/2022).  ? ?Ortho Exam ? ?Patient is alert, oriented, no adenopathy, well-dressed, normal affect, normal respiratory effort. ?On examination of the left residual limb this is well consolidated well-healed there is no area of skin breakdown no callus no erythema ?To the righ heel has decubitus ulcer posteriorly this is 4 inches in diameter this is covered with eschar there is some epiboly as well to the edges of the eschar is beginning to come away there is no active drainage no erythema surrounding no warmth ? ?Imaging: ?No results found. ?No images are attached to the encounter. ? ?Labs: ?Lab Results  ?Component Value Date  ? HGBA1C 7.1 (H) 04/15/2022  ? HGBA1C 8.4 (H) 01/07/2022  ? HGBA1C 10.0 (H) 11/18/2021  ? ESRSEDRATE 45 (H) 11/18/2021  ? ESRSEDRATE 41 (H) 09/16/2017  ? CRP 12.1 (H) 11/18/2021  ?  CRP 8.9 (H) 09/16/2017  ? REPTSTATUS 04/18/2022 FINAL 04/13/2022  ? CULT  04/13/2022  ?  NO GROWTH 5 DAYS ?Performed at [facility], [address], [place], [name] [address] ?  ? LABORGA ENTEROCOCCUS FAECALIS (A) 01/06/2022  ? ? ? ?Lab Results  ?Component Value Date  ? ALBUMIN 2.6 (L) 04/13/2022  ? ALBUMIN 2.4 (L) 01/07/2022  ? ALBUMIN 2.4 (L) 01/06/2022  ? PREALBUMIN 16.5 (L) 01/07/2022  ? ? ?Lab Results  ?Component Value Date  ? MG 1.7 04/16/2022  ? MG 1.2 (L) 04/13/2022  ? MG 1.2 (L) 01/07/2022  ? ?Lab Results  ?Component Value Date  ? VD25OH 9.40 (L) 01/07/2022  ? ? ?Lab Results  ?Component Value Date  ? PREALBUMIN 16.5 (L) 01/07/2022  ? ? ?  Latest Ref Rng & Units 04/16/2022  ?  4:20 AM 04/15/2022  ?  6:11 AM 04/14/2022  ?  4:56 AM  ?CBC EXTENDED  ?WBC 4.0 - 10.5 K/uL 9.8   12.3     ?RBC 4.22 - 5.81 MIL/uL 4.49   4.85     ?Hemoglobin 13.0 - 17.0 g/dL 48.1   85.6   8.8    ?HCT 39.0 - 52.0 % 35.0   38.1   26.0    ?  Platelets 150 - 400 K/uL 315   378     ? ? ? ?There is no height or weight on file to calculate BMI. ? ?Orders:  ?No orders of the defined types were placed in this encounter. ? ?No orders of the defined types were placed in this encounter. ? ? ? Procedures: ?No procedures performed ? ?Clinical Data: ?No additional findings. ? ?ROS: ? ?All other systems negative, except as noted in the HPI. ?Review of Systems ? ?Objective: ?Vital Signs: There were no vitals taken for this visit. ? ?Specialty Comments:  ?No specialty comments available. ? ?PMFS History: ?Patient Active Problem List  ? Diagnosis Date Noted  ? AMS (altered mental status) 04/14/2022  ? Acute respiratory failure with hypoxia (HCC) 04/14/2022  ? Acute metabolic encephalopathy 04/14/2022  ? Pneumonia 04/14/2022  ? Ulcer of right foot, limited to breakdown of skin (HCC)   ? Below-knee amputation of left lower extremity (HCC)   ? Osteomyelitis (HCC) 01/06/2022  ? Bipolar disorder with depression (HCC)   ? Sepsis (HCC) 11/18/2021  ? Chest  pain 11/18/2021  ? Dyspnea 11/18/2021  ? Wound infection   ? Ulcer of left foot with necrosis of bone (HCC)   ? Bicuspid aortic valve 09/14/2021  ? Aneurysm of ascending aorta (HCC) 09/14/2021  ? Hyperkalemia 08/20/2021  ? DM (diabetes mellitus) with peripheral vascular complication (HCC) 08/20/2021  ? Atypical chest pain 07/17/2021  ? Hyponatremia 07/17/2021  ? Leg wound, left 07/17/2021  ? Essential hypertension 07/17/2021  ? Acute hyperglycemia 07/09/2021  ? Left leg cellulitis 04/11/2021  ? Cellulitis of left leg 03/05/2021  ? Cellulitis 02/18/2021  ? PAD (peripheral artery disease) (HCC) 10/10/2020  ? Osteomyelitis of fourth toe of right foot (HCC)   ? S/P CABG x 4 07/24/2020  ? Non-ST elevation (NSTEMI) myocardial infarction (HCC) 07/22/2020  ? Acute on chronic diastolic heart failure (HCC) 07/22/2020  ? Hypotension 07/18/2020  ? Left leg swelling 07/18/2020  ? Elevated troponin 07/18/2020  ? AKI (acute kidney injury) (HCC) 07/18/2020  ? Elevated brain natriuretic peptide (BNP) level 07/18/2020  ? GERD (gastroesophageal reflux disease) 07/18/2020  ? Unstable angina (HCC) 11/01/2019  ? Edema 06/13/2019  ? Leukocytosis 05/31/2019  ? Smoker 03/08/2019  ? Chronic pain 03/08/2019  ? Chronic back pain 02/16/2019  ? Deep venous thrombosis (HCC) 02/16/2019  ? Obesity, Class III, BMI 40-49.9 (morbid obesity) (HCC) 02/16/2019  ? Peripheral nerve disease 02/16/2019  ? COPD (chronic obstructive pulmonary disease) (HCC) 01/22/2019  ? Anxiety disorder 03/26/2018  ? Open wound of toe 10/12/2017  ? Sinusitis 10/12/2017  ? Abdominal pain   ? Loss of weight   ? Diabetic foot infection (HCC) 09/16/2017  ? Uncontrolled type 2 diabetes mellitus with hyperglycemia, with long-term current use of insulin (HCC) 09/16/2017  ? Cellulitis of right foot   ? Chronic ulcer of great toe of right foot (HCC) 09/09/2017  ? Recurrent chest pain 07/29/2016  ? Hyperglycemia   ? Insulin dependent type 2 diabetes mellitus (HCC) 04/29/2015  ? OSA  (obstructive sleep apnea) 05/08/2013  ? History of pulmonary embolus (PE) 04/27/2013  ? CAD -S/P PCI 11/02/19  09/10/2008  ? Hyperlipidemia 09/09/2008  ? ?Past Medical History:  ?Diagnosis Date  ? Aneurysm of ascending aorta (HCC) 09/14/2021  ? Anxiety   ? Arthritis   ? "knees, shoulders, hips, ankles" (07/29/2016)  ? Asthma   ? Bicuspid aortic valve 09/14/2021  ? CAD (coronary artery disease)   ? a. 2017: s/p BMS to distal  Cx; b. LHC 05/30/2019: 80% mid, distal RCA s/p DES, 30% narrowing of d LM, widely patent LAD w/ luminal irregularities, widely patent stent in dCX  w/ 90+% stenosis distal to stent beofre small trifurcating obtuse marginal (potentially area of restenosis)  ? Chronic bronchitis (HCC)   ? Chronic ulcer of right great toe (HCC) 09/19/2017  ? COPD (chronic obstructive pulmonary disease) (HCC)   ? Depression   ? DVT (deep venous thrombosis) (HCC)   ? GERD (gastroesophageal reflux disease)   ? Hyperlipidemia   ? Hypertension   ? Myocardial infarction [facility]) 1996  ? "light one"  ? PE (pulmonary embolism) 04/2013  ? On chronic Xarelto  ? Peripheral nerve disease   ? Pneumonia "several times"  ? Sleep apnea   ? Type II diabetes mellitus (HCC)   ?  ?Family History  ?Problem Relation Age of Onset  ? Hypertension Brother   ? Hypertension Father   ? Diabetes Other   ? Hyperlipidemia Other   ?  ?Past Surgical History:  ?Procedure Laterality Date  ? ABDOMINAL AORTOGRAM W/LOWER EXTREMITY N/A 10/10/2020  ? Procedure: ABDOMINAL AORTOGRAM W/LOWER EXTREMITY;  Surgeon: Sherren Kerns, MD;  Location: [facility] INVASIVE CV LAB;  Service: Cardiovascular;  Laterality: N/A;  ? AMPUTATION Right 09/21/2017  ? Procedure: RIGHT GREAT TOE AMPUTATION, POSSIBLE VAC;  Surgeon: Tarry Kos, MD;  Location: [facility];  Service: Orthopedics;  Laterality: Right;  ? AMPUTATION Left 08/13/2020  ? Procedure: LEFT FOOT 4TH RAY AMPUTATION;  Surgeon: [name], MD;  Location: [facility] OR;  Service: Orthopedics;  Laterality: Left;  ? AMPUTATION Left  11/20/2021  ? Procedure: LEFT TRANSMETATARSAL AMPUTATION;  Surgeon: [name], MD;  Location: [facility] OR;  Service: Orthopedics;  Laterality: Left;  ? AMPUTATION Left 01/08/2022  ? Procedure: LEFT BELOW KNEE AMP

## 2022-05-14 ENCOUNTER — Ambulatory Visit: Payer: Medicare HMO | Admitting: Physician Assistant

## 2022-05-31 ENCOUNTER — Telehealth: Payer: Self-pay | Admitting: Cardiovascular Disease

## 2022-05-31 NOTE — Telephone Encounter (Signed)
Rn returned call to patient's wife (ok per DPR). Patient's wife states that his increased SHOB started last Monday, also endorses that his chest has been bothering him. He has been using his inhaler during these episodes. His blood pressure has been slowly increasing and staying higher than normal.    Saturday he started complaining of headache, nauseated with general malaise, very tired with increased sleeping. BP today 181/80 HR: 83.   Patient's wife states that he denies chest pain. Patient's wife states that he is acting like he was last time when his stent collapsed.   Wife reports that he was just in hospital last month with acute respiratory failure.    Consulted with Gillian Shields, NP in clinic who agreed with plan to send patient to ED. Patient's wife is agreeable and states she will take him.

## 2022-05-31 NOTE — Telephone Encounter (Signed)
Pt this via Mychart message to the scheduling pool:     This is [name] his wife. I can hear that he's been breathing harder when he says he's short of breath. I don't know if you'll be able to hear it over the phone or not. I've been having him use his inhaler when this happens. He's also been having headaches and nauseous. He's been like this on and off since last week. It happens when he's sitting up or laying down. I actually tried calling y'all before sending a message. I checked his blood pressure about 30 minutes ago and it was 181/108 and pulse was 76.      Subject:Appointment Request  Good Morning Fred,  Can you tell me a little more about your Shortness of Breath?     1. Are you currently SOB (can you hear that pt is SOB on the phone)?   2. How long have you been experiencing SOB?   3. Are you SOB when sitting or when up moving around?   4. Are you currently experiencing any other symptoms?     Appointment Request From: [name] Voller  With Provider: Tereso Newcomer, PA-C [facility] [address] [facility]]  Preferred Date Range: Any  Preferred Times: Any Time  Reason for visit: Office Visit  Comments: I'm scheduled to see Tereso Newcomer on the 27th. I'm currently on the wait list to be seen sooner if someone cancels. If at all possible can I PLEASE be seen sooner even if it's with a different doctor? I've been having some difficulty breathing that comes and goes and my blood pressure has also been running high on those days as well. Please call my wife and let her know if I can be seen sooner. My wife has medical power of attorney her name is [name] and her # is [phone]. Thanks

## 2022-06-04 ENCOUNTER — Telehealth: Payer: Self-pay | Admitting: Orthopedic Surgery

## 2022-06-04 NOTE — Telephone Encounter (Signed)
Pt called requesting a call back. Pt states a new script for a wheelchair. Pt states his wheelchair broke yesterday. Pt states he is 350 Ibs and his wheelchair states to hold only 300 Lbs. Pt is asking for a wheelchair to hold to his weight. Please call pt at [phone].

## 2022-06-04 NOTE — Telephone Encounter (Signed)
Pt called and states this will be fine. Will address at visit Wednesday.

## 2022-06-04 NOTE — Telephone Encounter (Signed)
Called pt and lm on vm advised I can put an order in through [facility] but I need hip width measurements and thigh length measurements and that I can get this at his office visit on Wednesday. Will hold so that I can [place] t visit.

## 2022-06-09 ENCOUNTER — Ambulatory Visit (INDEPENDENT_AMBULATORY_CARE_PROVIDER_SITE_OTHER): Payer: Medicare HMO | Admitting: Family

## 2022-06-09 ENCOUNTER — Ambulatory Visit: Payer: Self-pay

## 2022-06-09 ENCOUNTER — Encounter: Payer: Self-pay | Admitting: Family

## 2022-06-09 DIAGNOSIS — L89600 Pressure ulcer of unspecified heel, unstageable: Secondary | ICD-10-CM | POA: Diagnosis not present

## 2022-06-09 DIAGNOSIS — Z89512 Acquired absence of left leg below knee: Secondary | ICD-10-CM

## 2022-06-09 MED ORDER — DOXYCYCLINE HYCLATE 100 MG PO TABS: 100.0000 mg | ORAL_TABLET | Freq: Two times a day (BID) | ORAL | 0 refills | Status: DC

## 2022-06-09 NOTE — Progress Notes (Signed)
Office Visit Note   Patient: Joseph [name]           Date of Birth: 03/19/1971           MRN: [id number] Visit Date: 06/09/2022              Requested by: Marva Panda, NP [address] [place],  [name] [address] PCP: Marva Panda, NP  Chief Complaint  Patient presents with   Right Heel - Wound Check   Left Leg - Follow-up      HPI: The patient is a 51 year old gentleman who is seen today in follow-up for 2 separate issues he is status post left below-knee amputation has not yet been set up with a prosthesis.  He is also seen in follow-up for right heel decubitus ulcer he has home health assistance for wound care they continue to be encouraging that this will dry up and scab over.  He is concerned as well as his wife for worsening he states the wound is more wet and is having a foul odor  He is also requesting an order for extra heavy-duty wheelchair  Assessment & Plan: Visit Diagnoses:  1. Pressure injury of heel, unstageable, unspecified laterality (HCC)     Plan: The patient would benefit from an extra heavy-duty wheelchair due to his body habitus weight and size he cannot fit comfortably in standard wheelchair  Discussed using his shrinker with direct skin contact over his wound for his left residual limb quite concerned for osteomyelitis of the right heel will send for MRI and x-ray today in the office was unequivocal.  I am concerned that he may require further limb salvage surgery.  Follow-Up Instructions: No follow-ups on file.   Ortho Exam  Patient is alert, oriented, no adenopathy, well-dressed, normal affect, normal respiratory effort. On examination of the left residual limb the amputation is well consolidated well-healed however he does have medial ulcer over his amputation this is 3 and half centimeters in diameter filled in with granulation there is surrounding maceration no erythema no drainage no concerning sign for infection on examination  of his right heel there is a decubitus ulcer which is now measuring 12 cm in length 5 cm in length which has eschar there is some probing circumferentially there is some surrounding maceration there is no active drainage however the overall appearance of the ulcer is wet there is a mild odor.  His hip to hip measurement is 58 cm his femur to hip resident measurement is 57 cm  Imaging: No results found. No images are attached to the encounter.  Labs: Lab Results  Component Value Date   HGBA1C 7.1 (H) 04/15/2022   HGBA1C 8.4 (H) 01/07/2022   HGBA1C 10.0 (H) 11/18/2021   ESRSEDRATE 45 (H) 11/18/2021   ESRSEDRATE 41 (H) 09/16/2017   CRP 12.1 (H) 11/18/2021   CRP 8.9 (H) 09/16/2017   REPTSTATUS 04/18/2022 FINAL 04/13/2022   CULT  04/13/2022    NO GROWTH 5 DAYS Performed at [facility], [address], [place], [name] [address]    Imelda Pillow ENTEROCOCCUS FAECALIS (A) 01/06/2022     Lab Results  Component Value Date   ALBUMIN 2.6 (L) 04/13/2022   ALBUMIN 2.4 (L) 01/07/2022   ALBUMIN 2.4 (L) 01/06/2022   PREALBUMIN 16.5 (L) 01/07/2022    Lab Results  Component Value Date   MG 1.7 04/16/2022   MG 1.2 (L) 04/13/2022   MG 1.2 (L) 01/07/2022   Lab Results  Component Value Date   VD25OH 9.40 (L) 01/07/2022    Lab Results  Component Value Date   PREALBUMIN 16.5 (L) 01/07/2022      Latest Ref Rng & Units 04/16/2022    4:20 AM 04/15/2022    6:11 AM 04/14/2022    4:56 AM  CBC EXTENDED  WBC 4.0 - 10.5 K/uL 9.8  12.3    RBC 4.22 - 5.81 MIL/uL 4.49  4.85    Hemoglobin 13.0 - 17.0 g/dL 29.5  28.4  8.8   HCT 13.2 - 52.0 % 35.0  38.1  26.0   Platelets 150 - 400 K/uL 315  378       There is no height or weight on file to calculate BMI.  Orders:  Orders Placed This Encounter  Procedures   XR Os Calcis Right   No orders of the defined types were placed in this encounter.    Procedures: No procedures performed  Clinical Data: No additional findings.  ROS:  All  other systems negative, except as noted in the HPI. Review of Systems  Objective: Vital Signs: There were no vitals taken for this visit.  Specialty Comments:  No specialty comments available.  PMFS History: Patient Active Problem List   Diagnosis Date Noted   AMS (altered mental status) 04/14/2022   Acute respiratory failure with hypoxia (HCC) 04/14/2022   Acute metabolic encephalopathy 04/14/2022   Pneumonia 04/14/2022   Ulcer of right foot, limited to breakdown of skin (HCC)    Below-knee amputation of left lower extremity (HCC)    Osteomyelitis (HCC) 01/06/2022   Bipolar disorder with depression (HCC)    Sepsis (HCC) 11/18/2021   Chest pain 11/18/2021   Dyspnea 11/18/2021   Wound infection    Ulcer of left foot with necrosis of bone (HCC)    Bicuspid aortic valve 09/14/2021   Aneurysm of ascending aorta (HCC) 09/14/2021   Hyperkalemia 08/20/2021   DM (diabetes mellitus) with peripheral vascular complication (HCC) 08/20/2021   Atypical chest pain 07/17/2021   Hyponatremia 07/17/2021   Leg wound, left 07/17/2021   Essential hypertension 07/17/2021   Acute hyperglycemia 07/09/2021   Left leg cellulitis 04/11/2021   Cellulitis of left leg 03/05/2021   Cellulitis 02/18/2021   PAD (peripheral artery disease) (HCC) 10/10/2020   Osteomyelitis of fourth toe of right foot (HCC)    S/P CABG x 4 07/24/2020   Non-ST elevation (NSTEMI) myocardial infarction (HCC) 07/22/2020   Acute on chronic diastolic heart failure (HCC) 07/22/2020   Hypotension 07/18/2020   Left leg swelling 07/18/2020   Elevated troponin 07/18/2020   AKI (acute kidney injury) (HCC) 07/18/2020   Elevated brain natriuretic peptide (BNP) level 07/18/2020   GERD (gastroesophageal reflux disease) 07/18/2020   Unstable angina (HCC) 11/01/2019   Edema 06/13/2019   Leukocytosis 05/31/2019   Smoker 03/08/2019   Chronic pain 03/08/2019   Chronic back pain 02/16/2019   Deep venous thrombosis (HCC) 02/16/2019    Obesity, Class III, BMI 40-49.9 (morbid obesity) (HCC) 02/16/2019   Peripheral nerve disease 02/16/2019   COPD (chronic obstructive pulmonary disease) (HCC) 01/22/2019   Anxiety disorder 03/26/2018   Open wound of toe 10/12/2017   Sinusitis 10/12/2017   Abdominal pain    Loss of weight    Diabetic foot infection (HCC) 09/16/2017   Uncontrolled type 2 diabetes mellitus with hyperglycemia, with long-term current use of insulin (HCC) 09/16/2017   Cellulitis of right foot    Chronic ulcer of great toe of right foot (HCC) 09/09/2017  Recurrent chest pain 07/29/2016   Hyperglycemia    Insulin dependent type 2 diabetes mellitus (HCC) 04/29/2015   OSA (obstructive sleep apnea) 05/08/2013   History of pulmonary embolus (PE) 04/27/2013   CAD -S/P PCI 11/02/19  09/10/2008   Hyperlipidemia 09/09/2008   Past Medical History:  Diagnosis Date   Aneurysm of ascending aorta (HCC) 09/14/2021   Anxiety    Arthritis    "knees, shoulders, hips, ankles" (07/29/2016)   Asthma    Bicuspid aortic valve 09/14/2021   CAD (coronary artery disease)    a. 2017: s/p BMS to distal Cx; b. LHC 05/30/2019: 80% mid, distal RCA s/p DES, 30% narrowing of d LM, widely patent LAD w/ luminal irregularities, widely patent stent in dCX  w/ 90+% stenosis distal to stent beofre small trifurcating obtuse marginal (potentially area of restenosis)   Chronic bronchitis (HCC)    Chronic ulcer of right great toe (HCC) 09/19/2017   COPD (chronic obstructive pulmonary disease) (HCC)    Depression    DVT (deep venous thrombosis) (HCC)    GERD (gastroesophageal reflux disease)    Hyperlipidemia    Hypertension    Myocardial infarction (HCC) 1996   "light one"   PE (pulmonary embolism) 04/2013   On chronic Xarelto   Peripheral nerve disease    Pneumonia "several times"   Sleep apnea    Type II diabetes mellitus (HCC)     Family History  Problem Relation Age of Onset   Hypertension Brother    Hypertension Father    Diabetes  Other    Hyperlipidemia Other     Past Surgical History:  Procedure Laterality Date   ABDOMINAL AORTOGRAM W/LOWER EXTREMITY N/A 10/10/2020   Procedure: ABDOMINAL AORTOGRAM W/LOWER EXTREMITY;  Surgeon: Sherren Kerns, MD;  Location: [facility];  Service: Cardiovascular;  Laterality: N/A;   AMPUTATION Right 09/21/2017   Procedure: RIGHT GREAT TOE AMPUTATION, POSSIBLE VAC;  Surgeon: Tarry Kos, MD;  Location: [facility] OR;  Service: Orthopedics;  Laterality: Right;   AMPUTATION Left 08/13/2020   Procedure: LEFT FOOT 4TH RAY AMPUTATION;  Surgeon: [name], MD;  Location: [facility] OR;  Service: Orthopedics;  Laterality: Left;   AMPUTATION Left 11/20/2021   Procedure: LEFT TRANSMETATARSAL AMPUTATION;  Surgeon: [name], MD;  Location: [facility] OR;  Service: Orthopedics;  Laterality: Left;   AMPUTATION Left 01/08/2022   Procedure: LEFT BELOW KNEE AMPUTATION;  Surgeon: [name], MD;  Location: [facility] OR;  Service: Orthopedics;  Laterality: Left;   AORTOGRAM Bilateral 03/13/2021   Procedure: ABDOMINAL AORTOGRAM WITH Left LOWER EXTREMITY RUNOFF;  Surgeon: Leonie Douglas, MD;  Location: [facility] OR;  Service: Vascular;  Laterality: Bilateral;   APPLICATION OF WOUND VAC  01/08/2022   Procedure: APPLICATION OF WOUND VAC;  Surgeon: [name], MD;  Location: [facility] OR;  Service: Orthopedics;;   CARDIAC CATHETERIZATION  2006   CARDIAC CATHETERIZATION  1996   "@ [facility]; when I had my heart attack"   CARDIAC CATHETERIZATION N/A 07/29/2016   Procedure: Left Heart Cath and Coronary Angiography;  Surgeon: Runell Gess, MD;  Location: [facility] INV[facility] CV LAB;  Service: Cardiovascular;  Laterality: N/A;   CARDIAC CATHETERIZATION N/A 07/29/2016   Procedure: Coronary Stent Intervention;  Surgeon: Runell Gess, MD;  Location: [facility];  Service: Cardiovascular;  Laterality: N/A;   CARPAL TUNNEL RELEASE Bilateral    CORONARY ANGIOPLASTY WITH STENT PLACEMENT  07/29/2016   CORONARY ARTERY BYPASS GRAFT N/A  07/24/2020  Procedure: CORONARY ARTERY BYPASS GRAFTING (CABG), ON PUMP, TIMES FOUR, USING LEFT INTERNAL MAMMARY ARTERY AND ENDOSCOPICALLY HARVESTED RIGHT GREATER SAPHENOUS VEIN;  Surgeon: Corliss Skains, MD;  Location: [facility] OR;  Service: Open Heart Surgery;  Laterality: N/A;  FLOW TAC   CORONARY STENT INTERVENTION N/A 05/30/2019   Procedure: CORONARY STENT INTERVENTION;  Surgeon: Lyn Records, MD;  Location: [facility];  Service: Cardiovascular;  Laterality: N/A;   CORONARY STENT INTERVENTION N/A 11/02/2019   Procedure: CORONARY STENT INTERVENTION;  Surgeon: Iran Ouch, MD;  Location: [facility];  Service: Cardiovascular;  Laterality: N/A;   ESOPHAGOGASTRODUODENOSCOPY N/A 09/22/2017   Procedure: ESOPHAGOGASTRODUODENOSCOPY (EGD);  Surgeon: Meryl Dare, MD;  Location: [facility] ENDOSCOPY;  Service: Endoscopy;  Laterality: N/A;   INTRAVASCULAR PRESSURE WIRE/FFR STUDY N/A 07/22/2020   Procedure: INTRAVASCULAR PRESSURE WIRE/FFR STUDY;  Surgeon: Marykay Lex, MD;  Location: [facility] INVASIVE CV LAB;  Service: Cardiovascular;  Laterality: N/A;   KNEE ARTHROSCOPY Bilateral    "2 on left; 1 on the right"   LEFT HEART CATH AND CORONARY ANGIOGRAPHY N/A 11/02/2019   Procedure: LEFT HEART CATH AND CORONARY ANGIOGRAPHY;  Surgeon: Iran Ouch, MD;  Location: [facility];  Service: Cardiovascular;  Laterality: N/A;   LEFT HEART CATH AND CORONARY ANGIOGRAPHY N/A 07/22/2020   Procedure: LEFT HEART CATH AND CORONARY ANGIOGRAPHY;  Surgeon: Marykay Lex, MD;  Location: [facility] INVASIVE CV LAB;  Service: Cardiovascular;  Laterality: N/A;   LEFT HEART CATH AND CORS/GRAFTS ANGIOGRAPHY N/A 08/17/2021   Procedure: LEFT HEART CATH AND CORS/GRAFTS ANGIOGRAPHY;  Surgeon: Yvonne Kendall, MD;  Location: [facility];  Service: Cardiovascular;  Laterality: N/A;   PERIPHERAL VASCULAR INTERVENTION Left 10/11/2020   popliteal and SFA stent placement    PERIPHERAL VASCULAR INTERVENTION Left  10/10/2020   Procedure: PERIPHERAL VASCULAR INTERVENTION;  Surgeon: Sherren Kerns, MD;  Location: [facility];  Service: Cardiovascular;  Laterality: Left;   RIGHT/LEFT HEART CATH AND CORONARY ANGIOGRAPHY N/A 05/30/2019   Procedure: RIGHT/LEFT HEART CATH AND CORONARY ANGIOGRAPHY;  Surgeon: Lyn Records, MD;  Location: [facility];  Service: Cardiovascular;  Laterality: N/A;   SHOULDER OPEN ROTATOR CUFF REPAIR Bilateral    TEE WITHOUT CARDIOVERSION N/A 07/24/2020   Procedure: TRANSESOPHAGEAL ECHOCARDIOGRAM (TEE);  Surgeon: Corliss Skains, MD;  Location: [facility] OR;  Service: Open Heart Surgery;  Laterality: N/A;   Social History   Occupational History   Occupation: disabled     Associate Professor: UNEMPLOYED  Tobacco Use   Smoking status: Former    Packs/day: 1.00    Years: 34.00    Total pack years: 34.00    Types: Cigarettes    Quit date: 07/12/2020    Years since quitting: 1.9   Smokeless tobacco: Former    Types: Snuff, Chew  Vaping Use   Vaping Use: Never used  Substance and Sexual Activity   Alcohol use: Yes    Comment: RARE   Drug use: No   Sexual activity: Yes

## 2022-06-10 ENCOUNTER — Telehealth: Payer: Self-pay | Admitting: Orthopedic Surgery

## 2022-06-10 NOTE — Telephone Encounter (Signed)
Pts wife informed.

## 2022-06-10 NOTE — Telephone Encounter (Signed)
Pt had an appt yesterday with Erin. Requested an extra heavy duty wheelchair. We did measurements in exam room of hip width and length of femur. Erin documented reasoning for wheelchair. Order placed on parachute after visit. Pending approval.

## 2022-06-10 NOTE — Telephone Encounter (Signed)
Joseph [name] at [employer] reached out and states that the pt will have to go to the retail store to do the exchange as he is currently renting a wheelchair from them. They have canceled this order.

## 2022-06-14 NOTE — H&P (View-Only) (Signed)
Cardiology Office Note:    Date:  06/15/2022   ID:  Joseph [name], DOB May 08, 1971, MRN [id number]  PCP:  Marva Panda, NP  [facility] Providers Cardiologist:  Chilton Si, MD    Referring MD: Marva Panda, NP   Chief Complaint:  Chest Pain and Shortness of Breath    Patient Profile: Coronary artery disease  S/p PCI to LCx in 07/2016 S/p CABG in 2021 Cath 07/2021: L-LAD, S-D3, S-OM2, S-RPDA patent  Peripheral arterial disease  S/p partial foot amputation S/p stent to L popliteal and L SFA S/p L BKA Jan 2023 (HFpEF) heart failure with preserved ejection fraction  Ascending thoracic aortic aneurysm  Echocardiogram 12/2021: 4.3 cm  Hypertension  Hyperlipidemia  Diabetes mellitus  Bicuspid aortic valve Hx of pulmonary embolism  Chronic Obstructive Pulmonary Disease  OSA GERD  R foot ulcer   Prior CV Studies: Chest CTA 04/13/22 No pulmonary embolism  Aortic atherosclerosis   Echocardiogram 01/07/2022 Bicuspid aortic valve, EF 55-60, no RWMA, moderate LVH, GRII DD, normal RVSF, trivial MR, no aortic stenosis, mild dilation of ascending aorta (43 mm), mild dilation of aortic root (42 mm)  Cardiac catheterization 08/17/21 L-LAD patent S-D3 patent S-OM2 patent S-RPDA patent w prox and mid 30 LVEDP 30   History of Present Illness:   Joseph [name] is a 51 y.o. male with the above problem list.  He was last seen by Dr. Duke Salvia in Sept 22.  He was admitted in Jan 2023 with osteomyelitis/sepsis and underwent L BKA.  He was admitted again in April 2023 with toxic metabolic encephalopathy due to polypharmacy vs community acquired pneumonia.  He called in 05/31/22 with symptoms of worsening chest pain and shortness of breath.  He was advised to go to the ED.  Although, it appears he did not go to the ED.    He is here today with his wife.  He notes chronic chest discomfort since his bypass surgery.  Over the past few months it has been worse.  He has used  nitroglycerin with relief.  He has pain radiating to his left arm and between his scapula.  He has associated shortness of breath, nausea and diaphoresis.  He usually tries to avoid taking nitroglycerin.  He did take nitroglycerin recently with relief of symptoms.  His last episode of chest pain was last week.  He notes chest pain that last for an hour or more before it finally resolves.  He has noted worsening shortness of breath with exertion.  This has been especially worse since his amputation.  He sleeps on an incline.  He has an ulcer on his right heel and has had lower extremity edema since then.  He did have an episode of syncope last month.  This happened while he was eating.  He has no recollection of this and does not recall anything about this episode.  He has been referred to neurology for further evaluation.    Past Medical History:  Diagnosis Date   Aneurysm of ascending aorta (HCC) 09/14/2021   Anxiety    Arthritis    "knees, shoulders, hips, ankles" (07/29/2016)   Asthma    Bicuspid aortic valve 09/14/2021   CAD (coronary artery disease)    a. 2017: s/p BMS to distal Cx; b. LHC 05/30/2019: 80% mid, distal RCA s/p DES, 30% narrowing of d LM, widely patent LAD w/ luminal irregularities, widely patent stent in dCX  w/ 90+% stenosis distal to stent beofre small trifurcating obtuse marginal (potentially area  of restenosis)   Chronic bronchitis (HCC)    Chronic ulcer of great toe of right foot (HCC) 09/09/2017   Chronic ulcer of right great toe (HCC) 09/19/2017   COPD (chronic obstructive pulmonary disease) (HCC)    Depression    DVT (deep venous thrombosis) (HCC)    GERD (gastroesophageal reflux disease)    Hyperlipidemia    Hypertension    Myocardial infarction (HCC) 1996   "light one"   PE (pulmonary embolism) 04/2013   On chronic Xarelto   Peripheral nerve disease    Pneumonia "several times"   Sleep apnea    Type II diabetes mellitus (HCC)    Current Medications: Current  Meds  Medication Sig   albuterol (VENTOLIN HFA) 108 (90 Base) MCG/ACT inhaler Inhale 2 puffs into the lungs every 6 (six) hours as needed for wheezing or shortness of breath.   ALPRAZolam (XANAX) 1 MG tablet Take 1 mg by mouth daily as needed for anxiety.   aspirin EC 81 MG tablet Take 81 mg by mouth daily. Swallow whole.   atorvastatin (LIPITOR) 80 MG tablet Take 1 tablet (80 mg total) by mouth daily at 6 PM.   buPROPion (WELLBUTRIN SR) 150 MG 12 hr tablet Take 1 tablet (150 mg total) by mouth 2 (two) times daily.   carvedilol (COREG) 3.125 MG tablet Take 3.125 mg by mouth 2 (two) times daily.   clopidogrel (PLAVIX) 75 MG tablet Take 1 tablet (75 mg total) by mouth daily with breakfast.   doxycycline (VIBRA-TABS) 100 MG tablet Take 1 tablet (100 mg total) by mouth 2 (two) times daily.   escitalopram (LEXAPRO) 10 MG tablet Take 10 mg by mouth daily.   fluconazole (DIFLUCAN) 150 MG tablet Take 150 mg by mouth once a week.   fluticasone (FLONASE) 50 MCG/ACT nasal spray Place 2 sprays into both nostrils daily.   furosemide (LASIX) 40 MG tablet TAKE 1 TABLET(40 MG) BY MOUTH DAILY   icosapent Ethyl (VASCEPA) 1 g capsule Take 2 capsules (2 g total) by mouth 2 (two) times daily.   insulin aspart (NOVOLOG) 100 UNIT/ML FlexPen Inject 6 Units into the skin 3 (three) times daily with meals. Per sliding scale   insulin glargine (LANTUS) 100 UNIT/ML injection Inject 0.45 mLs (45 Units total) into the skin daily. (Patient taking differently: Inject 50 Units into the skin 2 (two) times daily.)   liraglutide (VICTOZA) 18 MG/3ML SOPN Inject 1.8 mg into the skin daily.   loratadine (CLARITIN) 10 MG tablet Take 10 mg by mouth daily.   meclizine (ANTIVERT) 25 MG tablet Take 25 mg by mouth 3 (three) times daily as needed for dizziness.   metFORMIN (GLUCOPHAGE) 1000 MG tablet Take 1 tablet (1,000 mg total) by mouth 2 (two) times daily with a meal.   naloxone (NARCAN) nasal spray 4 mg/0.1 mL Place 4 mg into the nose  as needed (overdose).   nitroGLYCERIN (NITROSTAT) 0.4 MG SL tablet Place 0.4 mg under the tongue every 5 (five) minutes as needed for chest pain.   oxyCODONE (ROXICODONE) 15 MG immediate release tablet Take 15 mg by mouth 5 (five) times daily.   pantoprazole (PROTONIX) 40 MG tablet Take 1 tablet (40 mg total) by mouth daily. (Patient taking differently: Take 80 mg by mouth daily.)   pregabalin (LYRICA) 150 MG capsule Take 2 capsules (300 mg total) by mouth in the morning.   pregabalin (LYRICA) 150 MG capsule Take 1 capsule (150 mg total) by mouth at bedtime.   promethazine (PHENERGAN) 25  MG tablet Take 25 mg by mouth every 4 (four) hours as needed for nausea or vomiting.   ranolazine (RANEXA) 500 MG 12 hr tablet Take 1 tablet (500 mg total) by mouth 2 (two) times daily.   [DISCONTINUED] fenofibrate (TRICOR) 145 MG tablet Take 145 mg by mouth daily.    Allergies:   Sulfa antibiotics   Social History   Tobacco Use   Smoking status: Former    Packs/day: 1.00    Years: 34.00    Total pack years: 34.00    Types: Cigarettes    Quit date: 07/12/2020    Years since quitting: 1.9   Smokeless tobacco: Former    Types: Snuff, Chew  Vaping Use   Vaping Use: Never used  Substance Use Topics   Alcohol use: Yes    Comment: RARE   Drug use: No    Family Hx: The patient's family history includes Diabetes in an other family member; Hyperlipidemia in an other family member; Hypertension in his brother and father.  Review of Systems  Constitutional: Negative for chills and fever.  Genitourinary:  Negative for hematuria.     EKGs/Labs/Other Test Reviewed:    EKG:  EKG is  ordered today.  The ekg ordered today demonstrates NSR, HR 86, left axis deviation, anterolateral Q waves, no ST-T wave changes, QTc 447  Recent Labs: 04/13/2022: ALT 10; TSH 0.787 04/16/2022: Magnesium 1.7 06/15/2022: BUN 36; Creatinine, Ser 1.44; Hemoglobin 11.1; Platelets 371; Potassium 4.5; Sodium 142   Recent Lipid  Panel Recent Labs    08/16/21 0312 04/14/22 0331  CHOL 189  --   TRIG 316* 62  HDL 31*  --   VLDL 63*  --   LDLCALC 95  --      Risk Assessment/Calculations/Metrics:              Physical Exam:    VS:  BP 122/72   Pulse 88   Ht 6\' 5"  (1.956 m)   Wt (!) 356 lb (161.5 kg)   SpO2 97%   BMI 42.22 kg/m     Wt Readings from Last 3 Encounters:  06/15/22 (!) 356 lb (161.5 kg)  04/16/22 (!) 363 lb 12.1 oz (165 kg)  01/09/22 (!) 358 lb 14.5 oz (162.8 kg)    Constitutional:      Appearance: Healthy appearance. Not in distress.  Pulmonary:     Effort: Pulmonary effort is normal.     Breath sounds: No wheezing. No rales.  Cardiovascular:     Normal rate. Regular rhythm. Normal S1. Normal S2.      Murmurs: There is no murmur.  Edema:    Peripheral edema absent.  Abdominal:     General: There is no distension.     Palpations: Abdomen is soft.  Musculoskeletal:     Cervical back: Neck supple.     Left Lower Extremity: Left leg is amputated below knee. Skin:    General: Skin is warm and dry.  Neurological:     General: No focal deficit present.     Mental Status: Alert and oriented to person, place and time.         ASSESSMENT & PLAN:   CAD (coronary artery disease) He has a history of prior PCI and subsequent CABG in 2021.  His cardiac catheterization August 2022 demonstrated patent bypass grafts.  He has several risk factors for worsening coronary artery disease.  He has had symptoms of worsening chest pain as well as worsening shortness of breath.  He does not have significant findings on exam to suggest volume overload.  However, he does also describe symptoms that sound consistent with CHF.  He would not be a good candidate for coronary CTA given prior bypass surgery.  We discussed the possibility proceeding with stress testing versus cardiac catheterization.  Given his worsening symptoms and risk factors, I favor proceeding with right and left heart catheterization.  I  reviewed this with Dr. Mayford Knife (attending MD) who agreed. Arrange right and left cardiac catheterization Continue aspirin 81 mg daily, Lipitor 80 mg daily, Plavix 85 mg daily, Ranexa 500 mg twice daily Follow-up after cardiac catheterization  Hyperlipidemia LDL goal <70 Recent LDL 95.  Continue atorvastatin 80 mg daily, Vascepa 2 g twice daily.  Consider follow-up lipids after cardiac catheterization and possible referral to Pharm.[facility]. lipid clinic for consideration of PCSK9 inhibitor.  Aneurysm of ascending aorta (HCC) 43 mm on echocardiogram in January 2023.  He will ultimately need a follow-up CT scan to size his aorta.  Bicuspid aortic valve No evidence of aortic stenosis on echocardiogram in January 2023.  DM (diabetes mellitus) with peripheral vascular complication (HCC) He has had left BKA.  He has a wound on his right foot.  He may ultimately need amputation of at least part of his foot.  Recent A1c was around 7.  Continue follow-up with primary care.  Essential hypertension Blood pressure is controlled.  Continue Coreg 3.125 mg twice daily, Lasix 40 mg daily  (HFpEF) heart failure with preserved ejection fraction (HCC) He does have symptoms that sound consistent with volume excess.  However, his exam does not suggest volume overload.  Lungs are clear.  Continue Lasix 40 mg daily.  Proceed with right and left heart catheterization as noted.  Below-knee amputation of left lower extremity (HCC) He is currently working on getting a prosthetic.  Syncope and collapse The description of his syncopal episodes sound more consistent with hypoglycemia or neurogenic cause.  He has been referred to neurology.  We can consider placing an event monitor on him when he returns for follow-up.        Shared Decision Making/Informed Consent The risks [stroke (1 in 1000), death (1 in 1000), kidney failure [usually temporary] (1 in 500), bleeding (1 in 200), allergic reaction [possibly serious] (1 in  200)], benefits (diagnostic support and management of coronary artery disease) and alternatives of a cardiac catheterization were discussed in detail with Mr. Mandigo and he is willing to proceed.   Dispo:  Return in about 2 weeks (around 06/29/2022) for Post Procedure Follow Up.   Medication Adjustments/Labs and Tests Ordered: Current medicines are reviewed at length with the patient today.  Concerns regarding medicines are outlined above.  Tests Ordered: Orders Placed This Encounter  Procedures   Basic metabolic panel   CBC   EKG 12-Lead   Medication Changes: No orders of the defined types were placed in this encounter.  Signed, Tereso Newcomer, PA-C  06/15/2022 6:22 PM    [facility] [address] [place], [place], [name]  [address] Phone: [phone]; Fax: [phone]

## 2022-06-14 NOTE — Progress Notes (Signed)
Cardiology Office Note:    Date:  06/15/2022   ID:  Milan Perkins Beller, DOB May 08, 1971, MRN [id number]  PCP:  Marva Panda, NP  [facility] Providers Cardiologist:  Chilton Si, MD    Referring MD: Marva Panda, NP   Chief Complaint:  Chest Pain and Shortness of Breath    Patient Profile: Coronary artery disease  S/p PCI to LCx in 07/2016 S/p CABG in 2021 Cath 07/2021: L-LAD, S-D3, S-OM2, S-RPDA patent  Peripheral arterial disease  S/p partial foot amputation S/p stent to L popliteal and L SFA S/p L BKA Jan 2023 (HFpEF) heart failure with preserved ejection fraction  Ascending thoracic aortic aneurysm  Echocardiogram 12/2021: 4.3 cm  Hypertension  Hyperlipidemia  Diabetes mellitus  Bicuspid aortic valve Hx of pulmonary embolism  Chronic Obstructive Pulmonary Disease  OSA GERD  R foot ulcer   Prior CV Studies: Chest CTA 04/13/22 No pulmonary embolism  Aortic atherosclerosis   Echocardiogram 01/07/2022 Bicuspid aortic valve, EF 55-60, no RWMA, moderate LVH, GRII DD, normal RVSF, trivial MR, no aortic stenosis, mild dilation of ascending aorta (43 mm), mild dilation of aortic root (42 mm)  Cardiac catheterization 08/17/21 L-LAD patent S-D3 patent S-OM2 patent S-RPDA patent w prox and mid 30 LVEDP 30   History of Present Illness:   Joseph [name] is a 51 y.o. male with the above problem list.  He was last seen by Dr. Duke Salvia in Sept 22.  He was admitted in Jan 2023 with osteomyelitis/sepsis and underwent L BKA.  He was admitted again in April 2023 with toxic metabolic encephalopathy due to polypharmacy vs community acquired pneumonia.  He called in 05/31/22 with symptoms of worsening chest pain and shortness of breath.  He was advised to go to the ED.  Although, it appears he did not go to the ED.    He is here today with his wife.  He notes chronic chest discomfort since his bypass surgery.  Over the past few months it has been worse.  He has used  nitroglycerin with relief.  He has pain radiating to his left arm and between his scapula.  He has associated shortness of breath, nausea and diaphoresis.  He usually tries to avoid taking nitroglycerin.  He did take nitroglycerin recently with relief of symptoms.  His last episode of chest pain was last week.  He notes chest pain that last for an hour or more before it finally resolves.  He has noted worsening shortness of breath with exertion.  This has been especially worse since his amputation.  He sleeps on an incline.  He has an ulcer on his right heel and has had lower extremity edema since then.  He did have an episode of syncope last month.  This happened while he was eating.  He has no recollection of this and does not recall anything about this episode.  He has been referred to neurology for further evaluation.    Past Medical History:  Diagnosis Date   Aneurysm of ascending aorta (HCC) 09/14/2021   Anxiety    Arthritis    "knees, shoulders, hips, ankles" (07/29/2016)   Asthma    Bicuspid aortic valve 09/14/2021   CAD (coronary artery disease)    a. 2017: s/p BMS to distal Cx; b. LHC 05/30/2019: 80% mid, distal RCA s/p DES, 30% narrowing of d LM, widely patent LAD w/ luminal irregularities, widely patent stent in dCX  w/ 90+% stenosis distal to stent beofre small trifurcating obtuse marginal (potentially area  of restenosis)   Chronic bronchitis (HCC)    Chronic ulcer of great toe of right foot (HCC) 09/09/2017   Chronic ulcer of right great toe (HCC) 09/19/2017   COPD (chronic obstructive pulmonary disease) (HCC)    Depression    DVT (deep venous thrombosis) (HCC)    GERD (gastroesophageal reflux disease)    Hyperlipidemia    Hypertension    Myocardial infarction (HCC) 1996   "light one"   PE (pulmonary embolism) 04/2013   On chronic Xarelto   Peripheral nerve disease    Pneumonia "several times"   Sleep apnea    Type II diabetes mellitus (HCC)    Current Medications: Current  Meds  Medication Sig   albuterol (VENTOLIN HFA) 108 (90 Base) MCG/ACT inhaler Inhale 2 puffs into the lungs every 6 (six) hours as needed for wheezing or shortness of breath.   ALPRAZolam (XANAX) 1 MG tablet Take 1 mg by mouth daily as needed for anxiety.   aspirin EC 81 MG tablet Take 81 mg by mouth daily. Swallow whole.   atorvastatin (LIPITOR) 80 MG tablet Take 1 tablet (80 mg total) by mouth daily at 6 PM.   buPROPion (WELLBUTRIN SR) 150 MG 12 hr tablet Take 1 tablet (150 mg total) by mouth 2 (two) times daily.   carvedilol (COREG) 3.125 MG tablet Take 3.125 mg by mouth 2 (two) times daily.   clopidogrel (PLAVIX) 75 MG tablet Take 1 tablet (75 mg total) by mouth daily with breakfast.   doxycycline (VIBRA-TABS) 100 MG tablet Take 1 tablet (100 mg total) by mouth 2 (two) times daily.   escitalopram (LEXAPRO) 10 MG tablet Take 10 mg by mouth daily.   fluconazole (DIFLUCAN) 150 MG tablet Take 150 mg by mouth once a week.   fluticasone (FLONASE) 50 MCG/ACT nasal spray Place 2 sprays into both nostrils daily.   furosemide (LASIX) 40 MG tablet TAKE 1 TABLET(40 MG) BY MOUTH DAILY   icosapent Ethyl (VASCEPA) 1 g capsule Take 2 capsules (2 g total) by mouth 2 (two) times daily.   insulin aspart (NOVOLOG) 100 UNIT/ML FlexPen Inject 6 Units into the skin 3 (three) times daily with meals. Per sliding scale   insulin glargine (LANTUS) 100 UNIT/ML injection Inject 0.45 mLs (45 Units total) into the skin daily. (Patient taking differently: Inject 50 Units into the skin 2 (two) times daily.)   liraglutide (VICTOZA) 18 MG/3ML SOPN Inject 1.8 mg into the skin daily.   loratadine (CLARITIN) 10 MG tablet Take 10 mg by mouth daily.   meclizine (ANTIVERT) 25 MG tablet Take 25 mg by mouth 3 (three) times daily as needed for dizziness.   metFORMIN (GLUCOPHAGE) 1000 MG tablet Take 1 tablet (1,000 mg total) by mouth 2 (two) times daily with a meal.   naloxone (NARCAN) nasal spray 4 mg/0.1 mL Place 4 mg into the nose  as needed (overdose).   nitroGLYCERIN (NITROSTAT) 0.4 MG SL tablet Place 0.4 mg under the tongue every 5 (five) minutes as needed for chest pain.   oxyCODONE (ROXICODONE) 15 MG immediate release tablet Take 15 mg by mouth 5 (five) times daily.   pantoprazole (PROTONIX) 40 MG tablet Take 1 tablet (40 mg total) by mouth daily. (Patient taking differently: Take 80 mg by mouth daily.)   pregabalin (LYRICA) 150 MG capsule Take 2 capsules (300 mg total) by mouth in the morning.   pregabalin (LYRICA) 150 MG capsule Take 1 capsule (150 mg total) by mouth at bedtime.   promethazine (PHENERGAN) 25  MG tablet Take 25 mg by mouth every 4 (four) hours as needed for nausea or vomiting.   ranolazine (RANEXA) 500 MG 12 hr tablet Take 1 tablet (500 mg total) by mouth 2 (two) times daily.   [DISCONTINUED] fenofibrate (TRICOR) 145 MG tablet Take 145 mg by mouth daily.    Allergies:   Sulfa antibiotics   Social History   Tobacco Use   Smoking status: Former    Packs/day: 1.00    Years: 34.00    Total pack years: 34.00    Types: Cigarettes    Quit date: 07/12/2020    Years since quitting: 1.9   Smokeless tobacco: Former    Types: Snuff, Chew  Vaping Use   Vaping Use: Never used  Substance Use Topics   Alcohol use: Yes    Comment: RARE   Drug use: No    Family Hx: The patient's family history includes Diabetes in an other family member; Hyperlipidemia in an other family member; Hypertension in his brother and father.  Review of Systems  Constitutional: Negative for chills and fever.  Genitourinary:  Negative for hematuria.     EKGs/Labs/Other Test Reviewed:    EKG:  EKG is  ordered today.  The ekg ordered today demonstrates NSR, HR 86, left axis deviation, anterolateral Q waves, no ST-T wave changes, QTc 447  Recent Labs: 04/13/2022: ALT 10; TSH 0.787 04/16/2022: Magnesium 1.7 06/15/2022: BUN 36; Creatinine, Ser 1.44; Hemoglobin 11.1; Platelets 371; Potassium 4.5; Sodium 142   Recent Lipid  Panel Recent Labs    08/16/21 0312 04/14/22 0331  CHOL 189  --   TRIG 316* 62  HDL 31*  --   VLDL 63*  --   LDLCALC 95  --      Risk Assessment/Calculations/Metrics:              Physical Exam:    VS:  BP 122/72   Pulse 88   Ht 6\' 5"  (1.956 m)   Wt (!) 356 lb (161.5 kg)   SpO2 97%   BMI 42.22 kg/m     Wt Readings from Last 3 Encounters:  06/15/22 (!) 356 lb (161.5 kg)  04/16/22 (!) 363 lb 12.1 oz (165 kg)  01/09/22 (!) 358 lb 14.5 oz (162.8 kg)    Constitutional:      Appearance: Healthy appearance. Not in distress.  Pulmonary:     Effort: Pulmonary effort is normal.     Breath sounds: No wheezing. No rales.  Cardiovascular:     Normal rate. Regular rhythm. Normal S1. Normal S2.      Murmurs: There is no murmur.  Edema:    Peripheral edema absent.  Abdominal:     General: There is no distension.     Palpations: Abdomen is soft.  Musculoskeletal:     Cervical back: Neck supple.     Left Lower Extremity: Left leg is amputated below knee. Skin:    General: Skin is warm and dry.  Neurological:     General: No focal deficit present.     Mental Status: Alert and oriented to person, place and time.         ASSESSMENT & PLAN:   CAD (coronary artery disease) He has a history of prior PCI and subsequent CABG in 2021.  His cardiac catheterization August 2022 demonstrated patent bypass grafts.  He has several risk factors for worsening coronary artery disease.  He has had symptoms of worsening chest pain as well as worsening shortness of breath.  He does not have significant findings on exam to suggest volume overload.  However, he does also describe symptoms that sound consistent with CHF.  He would not be a good candidate for coronary CTA given prior bypass surgery.  We discussed the possibility proceeding with stress testing versus cardiac catheterization.  Given his worsening symptoms and risk factors, I favor proceeding with right and left heart catheterization.  I  reviewed this with Dr. Mayford Knife (attending MD) who agreed. Arrange right and left cardiac catheterization Continue aspirin 81 mg daily, Lipitor 80 mg daily, Plavix 85 mg daily, Ranexa 500 mg twice daily Follow-up after cardiac catheterization  Hyperlipidemia LDL goal <70 Recent LDL 95.  Continue atorvastatin 80 mg daily, Vascepa 2 g twice daily.  Consider follow-up lipids after cardiac catheterization and possible referral to Pharm.[facility]. lipid clinic for consideration of PCSK9 inhibitor.  Aneurysm of ascending aorta (HCC) 43 mm on echocardiogram in January 2023.  He will ultimately need a follow-up CT scan to size his aorta.  Bicuspid aortic valve No evidence of aortic stenosis on echocardiogram in January 2023.  DM (diabetes mellitus) with peripheral vascular complication (HCC) He has had left BKA.  He has a wound on his right foot.  He may ultimately need amputation of at least part of his foot.  Recent A1c was around 7.  Continue follow-up with primary care.  Essential hypertension Blood pressure is controlled.  Continue Coreg 3.125 mg twice daily, Lasix 40 mg daily  (HFpEF) heart failure with preserved ejection fraction (HCC) He does have symptoms that sound consistent with volume excess.  However, his exam does not suggest volume overload.  Lungs are clear.  Continue Lasix 40 mg daily.  Proceed with right and left heart catheterization as noted.  Below-knee amputation of left lower extremity (HCC) He is currently working on getting a prosthetic.  Syncope and collapse The description of his syncopal episodes sound more consistent with hypoglycemia or neurogenic cause.  He has been referred to neurology.  We can consider placing an event monitor on him when he returns for follow-up.        Shared Decision Making/Informed Consent The risks [stroke (1 in 1000), death (1 in 1000), kidney failure [usually temporary] (1 in 500), bleeding (1 in 200), allergic reaction [possibly serious] (1 in  200)], benefits (diagnostic support and management of coronary artery disease) and alternatives of a cardiac catheterization were discussed in detail with Mr. Mandigo and he is willing to proceed.   Dispo:  Return in about 2 weeks (around 06/29/2022) for Post Procedure Follow Up.   Medication Adjustments/Labs and Tests Ordered: Current medicines are reviewed at length with the patient today.  Concerns regarding medicines are outlined above.  Tests Ordered: Orders Placed This Encounter  Procedures   Basic metabolic panel   CBC   EKG 12-Lead   Medication Changes: No orders of the defined types were placed in this encounter.  Signed, Tereso Newcomer, PA-C  06/15/2022 6:22 PM    [facility] [address] [place], [place], [name]  [address] Phone: [phone]; Fax: [phone]

## 2022-06-15 ENCOUNTER — Ambulatory Visit (INDEPENDENT_AMBULATORY_CARE_PROVIDER_SITE_OTHER): Payer: Medicare HMO | Admitting: Physician Assistant

## 2022-06-15 ENCOUNTER — Encounter: Payer: Self-pay | Admitting: Physician Assistant

## 2022-06-15 VITALS — BP 122/72 | HR 88 | Ht 77.0 in | Wt 356.0 lb

## 2022-06-15 DIAGNOSIS — I5032 Chronic diastolic (congestive) heart failure: Secondary | ICD-10-CM

## 2022-06-15 DIAGNOSIS — I7121 Aneurysm of the ascending aorta, without rupture: Secondary | ICD-10-CM | POA: Diagnosis not present

## 2022-06-15 DIAGNOSIS — I739 Peripheral vascular disease, unspecified: Secondary | ICD-10-CM

## 2022-06-15 DIAGNOSIS — R55 Syncope and collapse: Secondary | ICD-10-CM | POA: Insufficient documentation

## 2022-06-15 DIAGNOSIS — I10 Essential (primary) hypertension: Secondary | ICD-10-CM

## 2022-06-15 DIAGNOSIS — I25119 Atherosclerotic heart disease of native coronary artery with unspecified angina pectoris: Secondary | ICD-10-CM | POA: Diagnosis not present

## 2022-06-15 DIAGNOSIS — E1151 Type 2 diabetes mellitus with diabetic peripheral angiopathy without gangrene: Secondary | ICD-10-CM | POA: Diagnosis not present

## 2022-06-15 DIAGNOSIS — Z89512 Acquired absence of left leg below knee: Secondary | ICD-10-CM

## 2022-06-15 DIAGNOSIS — E785 Hyperlipidemia, unspecified: Secondary | ICD-10-CM

## 2022-06-15 DIAGNOSIS — Q231 Congenital insufficiency of aortic valve: Secondary | ICD-10-CM

## 2022-06-15 DIAGNOSIS — S88112A Complete traumatic amputation at level between knee and ankle, left lower leg, initial encounter: Secondary | ICD-10-CM

## 2022-06-15 LAB — CBC
Hematocrit: 35.5 % — ABNORMAL LOW (ref 37.5–51.0)
Hemoglobin: 11.1 g/dL — ABNORMAL LOW (ref 13.0–17.7)
MCH: 23.5 pg — ABNORMAL LOW (ref 26.6–33.0)
MCHC: 31.3 g/dL — ABNORMAL LOW (ref 31.5–35.7)
MCV: 75 fL — ABNORMAL LOW (ref 79–97)
Platelets: 371 10*3/uL (ref 150–450)
RBC: 4.72 x10E6/uL (ref 4.14–5.80)
RDW: 19 % — ABNORMAL HIGH (ref 11.6–15.4)
WBC: 11 10*3/uL — ABNORMAL HIGH (ref 3.4–10.8)

## 2022-06-15 LAB — BASIC METABOLIC PANEL
BUN/Creatinine Ratio: 25 — ABNORMAL HIGH (ref 9–20)
BUN: 36 mg/dL — ABNORMAL HIGH (ref 6–24)
CO2: 26 mmol/L (ref 20–29)
Calcium: 9 mg/dL (ref 8.7–10.2)
Chloride: 107 mmol/L — ABNORMAL HIGH (ref 96–106)
Creatinine, Ser: 1.44 mg/dL — ABNORMAL HIGH (ref 0.76–1.27)
Glucose: 117 mg/dL — ABNORMAL HIGH (ref 70–99)
Potassium: 4.5 mmol/L (ref 3.5–5.2)
Sodium: 142 mmol/L (ref 134–144)
eGFR: 60 mL/min/{1.73_m2} (ref >59)

## 2022-06-15 NOTE — Assessment & Plan Note (Signed)
He has had left BKA.  He has a wound on his right foot.  He may ultimately need amputation of at least part of his foot.  Recent A1c was around 7.  Continue follow-up with primary care.

## 2022-06-15 NOTE — Assessment & Plan Note (Signed)
43 mm on echocardiogram in January 2023.  He will ultimately need a follow-up CT scan to size his aorta.

## 2022-06-15 NOTE — Assessment & Plan Note (Signed)
The description of his syncopal episodes sound more consistent with hypoglycemia or neurogenic cause.  He has been referred to neurology.  We can consider placing an event monitor on him when he returns for follow-up.

## 2022-06-16 ENCOUNTER — Telehealth: Payer: Self-pay | Admitting: *Deleted

## 2022-06-16 NOTE — Telephone Encounter (Signed)
Cardiac Catheterization scheduled at [facility] for: Thursday June 17, 2022 9 AM Arrival time and place: [facility] Main Entrance A at: 7 AM   Nothing to eat after midnight prior to procedure, clear liquids until 5 AM day of procedure.  Medication instructions: -Hold:  Lasix-day before and day of procedure  Insulin-AM of procedure/1/2 usual Insulin (Victoza per patient) HS prior to procedure  Metformin-day of procedure and 48 hours post procedure -Except hold medications usual morning medications can be taken with sips of water including aspirin 81 mg and Plavix 75 mg.  Confirmed patient has responsible adult to drive home post procedure and be with patient first 24 hours after arriving home.  Patient reports no new symptoms concerning for COVID-19 in the past 10 days.  Reviewed procedure instructions with patient.

## 2022-06-17 ENCOUNTER — Other Ambulatory Visit: Payer: Self-pay

## 2022-06-17 ENCOUNTER — Encounter (HOSPITAL_COMMUNITY): Payer: Self-pay | Admitting: Cardiovascular Disease

## 2022-06-17 ENCOUNTER — Ambulatory Visit (HOSPITAL_COMMUNITY)
Admission: RE | Admit: 2022-06-17 | Discharge: 2022-06-17 | Disposition: A | Discharge status: Home / Self Care | Payer: Medicare HMO | Source: Ambulatory Visit | Attending: Cardiovascular Disease | Admitting: Cardiovascular Disease

## 2022-06-17 ENCOUNTER — Ambulatory Visit (HOSPITAL_COMMUNITY)
Admission: RE | Disposition: A | Discharge status: Home / Self Care | Payer: Self-pay | Source: Ambulatory Visit | Attending: Cardiovascular Disease

## 2022-06-17 DIAGNOSIS — R0602 Shortness of breath: Secondary | ICD-10-CM | POA: Insufficient documentation

## 2022-06-17 DIAGNOSIS — Z951 Presence of aortocoronary bypass graft: Secondary | ICD-10-CM | POA: Diagnosis not present

## 2022-06-17 DIAGNOSIS — I2582 Chronic total occlusion of coronary artery: Secondary | ICD-10-CM | POA: Insufficient documentation

## 2022-06-17 DIAGNOSIS — Z7902 Long term (current) use of antithrombotics/antiplatelets: Secondary | ICD-10-CM | POA: Insufficient documentation

## 2022-06-17 DIAGNOSIS — E785 Hyperlipidemia, unspecified: Secondary | ICD-10-CM | POA: Diagnosis not present

## 2022-06-17 DIAGNOSIS — E1151 Type 2 diabetes mellitus with diabetic peripheral angiopathy without gangrene: Secondary | ICD-10-CM | POA: Insufficient documentation

## 2022-06-17 DIAGNOSIS — Y832 Surgical operation with anastomosis, bypass or graft as the cause of abnormal reaction of the patient, or of later complication, without mention of misadventure at the time of the procedure: Secondary | ICD-10-CM | POA: Insufficient documentation

## 2022-06-17 DIAGNOSIS — T82855A Stenosis of coronary artery stent, initial encounter: Secondary | ICD-10-CM | POA: Diagnosis not present

## 2022-06-17 DIAGNOSIS — Z794 Long term (current) use of insulin: Secondary | ICD-10-CM | POA: Insufficient documentation

## 2022-06-17 DIAGNOSIS — I2510 Atherosclerotic heart disease of native coronary artery without angina pectoris: Secondary | ICD-10-CM | POA: Diagnosis not present

## 2022-06-17 DIAGNOSIS — Z955 Presence of coronary angioplasty implant and graft: Secondary | ICD-10-CM | POA: Insufficient documentation

## 2022-06-17 DIAGNOSIS — Z87891 Personal history of nicotine dependence: Secondary | ICD-10-CM | POA: Diagnosis not present

## 2022-06-17 DIAGNOSIS — I110 Hypertensive heart disease with heart failure: Secondary | ICD-10-CM | POA: Diagnosis not present

## 2022-06-17 DIAGNOSIS — Z89512 Acquired absence of left leg below knee: Secondary | ICD-10-CM | POA: Diagnosis not present

## 2022-06-17 DIAGNOSIS — Z7984 Long term (current) use of oral hypoglycemic drugs: Secondary | ICD-10-CM | POA: Insufficient documentation

## 2022-06-17 DIAGNOSIS — Z79899 Other long term (current) drug therapy: Secondary | ICD-10-CM | POA: Insufficient documentation

## 2022-06-17 DIAGNOSIS — I7121 Aneurysm of the ascending aorta, without rupture: Secondary | ICD-10-CM | POA: Diagnosis not present

## 2022-06-17 DIAGNOSIS — I5032 Chronic diastolic (congestive) heart failure: Secondary | ICD-10-CM

## 2022-06-17 DIAGNOSIS — R079 Chest pain, unspecified: Secondary | ICD-10-CM | POA: Insufficient documentation

## 2022-06-17 DIAGNOSIS — Z7982 Long term (current) use of aspirin: Secondary | ICD-10-CM | POA: Insufficient documentation

## 2022-06-17 DIAGNOSIS — R55 Syncope and collapse: Secondary | ICD-10-CM | POA: Diagnosis not present

## 2022-06-17 DIAGNOSIS — I25119 Atherosclerotic heart disease of native coronary artery with unspecified angina pectoris: Secondary | ICD-10-CM

## 2022-06-17 DIAGNOSIS — I5030 Unspecified diastolic (congestive) heart failure: Secondary | ICD-10-CM | POA: Insufficient documentation

## 2022-06-17 DIAGNOSIS — Q231 Congenital insufficiency of aortic valve: Secondary | ICD-10-CM | POA: Diagnosis not present

## 2022-06-17 HISTORY — PX: RIGHT/LEFT HEART CATH AND CORONARY/GRAFT ANGIOGRAPHY: CATH118267

## 2022-06-17 LAB — POCT I-STAT EG7
Acid-base deficit: 3 mmol/L — ABNORMAL HIGH (ref 0.0–2.0)
Acid-base deficit: 1 mmol/L (ref 0.0–2.0)
Bicarbonate: 23.3 mmol/L (ref 20.0–28.0)
Bicarbonate: 25.1 mmol/L (ref 20.0–28.0)
Calcium, Ion: 1.27 mmol/L (ref 1.15–1.40)
Calcium, Ion: 1.27 mmol/L (ref 1.15–1.40)
HCT: 32 % — ABNORMAL LOW (ref 39.0–52.0)
HCT: 32 % — ABNORMAL LOW (ref 39.0–52.0)
Hemoglobin: 10.9 g/dL — ABNORMAL LOW (ref 13.0–17.0)
Hemoglobin: 10.9 g/dL — ABNORMAL LOW (ref 13.0–17.0)
O2 Saturation: 53 %
O2 Saturation: 54 %
Potassium: 4.8 mmol/L (ref 3.5–5.1)
Potassium: 4.7 mmol/L (ref 3.5–5.1)
Sodium: 145 mmol/L (ref 135–145)
Sodium: 145 mmol/L (ref 135–145)
TCO2: 25 mmol/L (ref 22–32)
TCO2: 27 mmol/L (ref 22–32)
pCO2, Ven: 45.2 mmHg (ref 44–60)
pCO2, Ven: 47.3 mmHg (ref 44–60)
pH, Ven: 7.32 (ref 7.25–7.43)
pH, Ven: 7.333 (ref 7.25–7.43)
pO2, Ven: 31 mmHg — CL (ref 32–45)
pO2, Ven: 31 mmHg — CL (ref 32–45)

## 2022-06-17 LAB — POCT I-STAT 7, (LYTES, BLD GAS, ICA,H+H)
Acid-base deficit: 2 mmol/L (ref 0.0–2.0)
Bicarbonate: 24.2 mmol/L (ref 20.0–28.0)
Calcium, Ion: 1.26 mmol/L (ref 1.15–1.40)
HCT: 32 % — ABNORMAL LOW (ref 39.0–52.0)
Hemoglobin: 10.9 g/dL — ABNORMAL LOW (ref 13.0–17.0)
O2 Saturation: 97 %
Potassium: 4.8 mmol/L (ref 3.5–5.1)
Sodium: 145 mmol/L (ref 135–145)
TCO2: 26 mmol/L (ref 22–32)
pCO2 arterial: 44.5 mmHg (ref 32–48)
pH, Arterial: 7.344 — ABNORMAL LOW (ref 7.35–7.45)
pO2, Arterial: 99 mmHg (ref 83–108)

## 2022-06-17 LAB — GLUCOSE, CAPILLARY
Glucose-Capillary: 166 mg/dL — ABNORMAL HIGH (ref 70–99)
Glucose-Capillary: 91 mg/dL (ref 70–99)

## 2022-06-17 SURGERY — RIGHT/LEFT HEART CATH AND CORONARY/GRAFT ANGIOGRAPHY
Anesthesia: LOCAL

## 2022-06-17 MED ORDER — CLOPIDOGREL BISULFATE 75 MG PO TABS
ORAL_TABLET | ORAL | Status: AC
  Filled 2022-06-17: qty 1

## 2022-06-17 MED ORDER — FENTANYL CITRATE (PF) 100 MCG/2ML IJ SOLN
INTRAMUSCULAR | Status: AC
  Filled 2022-06-17: qty 2

## 2022-06-17 MED ORDER — ATORVASTATIN CALCIUM 80 MG PO TABS: 80.0000 mg | ORAL_TABLET | Freq: Every day | ORAL | Status: DC

## 2022-06-17 MED ORDER — SODIUM CHLORIDE 0.9 % WEIGHT BASED INFUSION: 1.0000 mL/kg/h | INTRAVENOUS | Status: DC

## 2022-06-17 MED ORDER — SODIUM CHLORIDE 0.9 % WEIGHT BASED INFUSION: 3.0000 mL/kg/h | INTRAVENOUS | Status: AC

## 2022-06-17 MED ORDER — ASPIRIN 81 MG PO CHEW: 81.0000 mg | CHEWABLE_TABLET | Freq: Every day | ORAL | Status: DC

## 2022-06-17 MED ORDER — LIDOCAINE HCL (PF) 1 % IJ SOLN
INTRAMUSCULAR | Status: DC | PRN
  Administered 2022-06-17: 20 mL

## 2022-06-17 MED ORDER — FENTANYL CITRATE (PF) 100 MCG/2ML IJ SOLN
INTRAMUSCULAR | Status: DC | PRN
  Administered 2022-06-17 (×2): 25 ug via INTRAVENOUS
  Administered 2022-06-17: 50 ug via INTRAVENOUS
  Administered 2022-06-17: 25 ug via INTRAVENOUS

## 2022-06-17 MED ORDER — HYDRALAZINE HCL 20 MG/ML IJ SOLN
INTRAMUSCULAR | Status: AC
Start: 2022-06-17 — End: ?
  Filled 2022-06-17: qty 1

## 2022-06-17 MED ORDER — MIDAZOLAM HCL 2 MG/2ML IJ SOLN
INTRAMUSCULAR | Status: AC
  Filled 2022-06-17: qty 2

## 2022-06-17 MED ORDER — HYDRALAZINE HCL 20 MG/ML IJ SOLN: 10.0000 mg | INTRAMUSCULAR | Status: DC | PRN

## 2022-06-17 MED ORDER — HYDRALAZINE HCL 20 MG/ML IJ SOLN
INTRAMUSCULAR | Status: DC | PRN
  Administered 2022-06-17: 10 mg via INTRAVENOUS

## 2022-06-17 MED ORDER — SODIUM CHLORIDE 0.9% FLUSH: 3.0000 mL | Freq: Two times a day (BID) | INTRAVENOUS | Status: DC

## 2022-06-17 MED ORDER — MIDAZOLAM HCL 2 MG/2ML IJ SOLN
INTRAMUSCULAR | Status: DC | PRN
  Administered 2022-06-17 (×2): 1 mg via INTRAVENOUS
  Administered 2022-06-17: 2 mg via INTRAVENOUS

## 2022-06-17 MED ORDER — IOHEXOL 350 MG/ML SOLN
INTRAVENOUS | Status: DC | PRN
  Administered 2022-06-17: 85 mL

## 2022-06-17 MED ORDER — ASPIRIN 81 MG PO CHEW: 81.0000 mg | CHEWABLE_TABLET | ORAL | Status: DC

## 2022-06-17 MED ORDER — CLOPIDOGREL BISULFATE 75 MG PO TABS: 75.0000 mg | ORAL_TABLET | Freq: Every day | ORAL | Status: DC

## 2022-06-17 MED ORDER — LIDOCAINE HCL (PF) 1 % IJ SOLN
INTRAMUSCULAR | Status: AC
  Filled 2022-06-17: qty 30

## 2022-06-17 MED ORDER — LABETALOL HCL 5 MG/ML IV SOLN: 10.0000 mg | INTRAVENOUS | Status: DC | PRN

## 2022-06-17 MED ORDER — ONDANSETRON HCL 4 MG/2ML IJ SOLN: 4.0000 mg | Freq: Four times a day (QID) | INTRAMUSCULAR | Status: DC | PRN

## 2022-06-17 MED ORDER — ACETAMINOPHEN 325 MG PO TABS: 650.0000 mg | ORAL_TABLET | ORAL | Status: DC | PRN

## 2022-06-17 MED ORDER — SODIUM CHLORIDE 0.9 % IV SOLN
INTRAVENOUS | Status: DC
Start: 2022-06-17 — End: 2022-06-17

## 2022-06-17 MED ORDER — SODIUM CHLORIDE 0.9 % IV SOLN: 250.0000 mL | INTRAVENOUS | Status: DC | PRN

## 2022-06-17 MED ORDER — CLOPIDOGREL BISULFATE 75 MG PO TABS
75.0000 mg | ORAL_TABLET | Freq: Once | ORAL | Status: AC
  Administered 2022-06-17: 75 mg via ORAL

## 2022-06-17 MED ORDER — DIAZEPAM 5 MG PO TABS: 5.0000 mg | ORAL_TABLET | Freq: Four times a day (QID) | ORAL | Status: DC | PRN

## 2022-06-17 MED ORDER — VERAPAMIL HCL 2.5 MG/ML IV SOLN
INTRAVENOUS | Status: AC
  Filled 2022-06-17: qty 2

## 2022-06-17 MED ORDER — HEPARIN SODIUM (PORCINE) 1000 UNIT/ML IJ SOLN
INTRAMUSCULAR | Status: AC
  Filled 2022-06-17: qty 10

## 2022-06-17 MED ORDER — SODIUM CHLORIDE 0.9% FLUSH: 3.0000 mL | INTRAVENOUS | Status: DC | PRN

## 2022-06-17 SURGICAL SUPPLY — 13 items
CATH INFINITI 5FR MULTPACK ANG (CATHETERS) ×1 IMPLANT
CATH SWAN GANZ 7F STRAIGHT (CATHETERS) ×1 IMPLANT
CLOSURE MYNX CONTROL 5F (Vascular Products) ×1 IMPLANT
ELECT DEFIB PAD ADLT CADENCE (PAD) ×1 IMPLANT
KIT HEART LEFT (KITS) ×2 IMPLANT
PACK CARDIAC CATHETERIZATION (CUSTOM PROCEDURE TRAY) ×2 IMPLANT
SHEATH PINNACLE 5F 10CM (SHEATH) ×1 IMPLANT
SHEATH PINNACLE 7F 10CM (SHEATH) ×1 IMPLANT
SHEATH PROBE COVER 6X72 (BAG) ×1 IMPLANT
TRANSDUCER W/STOPCOCK (MISCELLANEOUS) ×2 IMPLANT
TUBING CIL FLEX 10 FLL-RA (TUBING) ×2 IMPLANT
WIRE EMERALD 3MM-J .025X260CM (WIRE) ×1 IMPLANT
WIRE EMERALD 3MM-J .035X150CM (WIRE) ×1 IMPLANT

## 2022-06-17 NOTE — Progress Notes (Signed)
Site area: rt groin venous sheath °Site Prior to Removal:  Level 0 °Pressure Applied For: 10 minutes °Manual:   yes °Patient Status During Pull:  stable °Post Pull Site:  Level 0 °Post Pull Instructions Given:  yes °Post Pull Pulses Present: rt dp dopplered °Dressing Applied:  gauze and tegaderm °Bedrest begins @ 1115 °Comments: °  °

## 2022-06-17 NOTE — Interval H&P Note (Signed)
Cath Lab Visit (complete for each Cath Lab visit)  Clinical Evaluation Leading to the Procedure:   ACS: No.  Non-ACS:    Anginal Classification: CCS III  Anti-ischemic medical therapy: Maximal Therapy (2 or more classes of medications)  Non-Invasive Test Results: No non-invasive testing performed  Prior CABG: No previous CABG      History and Physical Interval Note:  06/17/2022 9:30 AM  Joseph [name]  has presented today for surgery, with the diagnosis of short of breath - cad - chest pain -- heart failre.  The various methods of treatment have been discussed with the patient and family. After consideration of risks, benefits and other options for treatment, the patient has consented to  Procedure(s): RIGHT/LEFT HEART CATH AND CORONARY/GRAFT ANGIOGRAPHY (N/A) as a surgical intervention.  The patient's history has been reviewed, patient examined, no change in status, stable for surgery.  I have reviewed the patient's chart and labs.  Questions were answered to the patient's satisfaction.     Nicki Guadalajara

## 2022-06-18 MED FILL — Midazolam HCl Inj 2 MG/2ML (Base Equivalent): INTRAMUSCULAR | Qty: 2 | Status: AC

## 2022-06-24 ENCOUNTER — Ambulatory Visit
Admission: RE | Admit: 2022-06-24 | Discharge: 2022-06-24 | Disposition: A | Discharge status: Home / Self Care | Payer: Medicare HMO | Source: Ambulatory Visit | Attending: Family | Admitting: Family

## 2022-06-24 DIAGNOSIS — L89600 Pressure ulcer of unspecified heel, unstageable: Secondary | ICD-10-CM

## 2022-06-30 ENCOUNTER — Ambulatory Visit: Payer: Medicare HMO | Admitting: Physician Assistant

## 2022-06-30 ENCOUNTER — Other Ambulatory Visit: Payer: Self-pay

## 2022-06-30 ENCOUNTER — Inpatient Hospital Stay (HOSPITAL_COMMUNITY)
Admission: EM | Admit: 2022-06-30 | Discharge: 2022-07-13 | DRG: 616 | Disposition: A | Discharge status: Rehabilitation Facility | Payer: Medicare HMO | Attending: Internal Medicine | Admitting: Internal Medicine

## 2022-06-30 ENCOUNTER — Encounter (HOSPITAL_COMMUNITY): Payer: Self-pay

## 2022-06-30 ENCOUNTER — Emergency Department (HOSPITAL_COMMUNITY): Payer: Medicare HMO

## 2022-06-30 ENCOUNTER — Observation Stay (HOSPITAL_COMMUNITY): Payer: Medicare HMO

## 2022-06-30 DIAGNOSIS — Q231 Congenital insufficiency of aortic valve: Secondary | ICD-10-CM

## 2022-06-30 DIAGNOSIS — Z7984 Long term (current) use of oral hypoglycemic drugs: Secondary | ICD-10-CM

## 2022-06-30 DIAGNOSIS — E1152 Type 2 diabetes mellitus with diabetic peripheral angiopathy with gangrene: Secondary | ICD-10-CM | POA: Diagnosis present

## 2022-06-30 DIAGNOSIS — I10 Essential (primary) hypertension: Secondary | ICD-10-CM | POA: Diagnosis present

## 2022-06-30 DIAGNOSIS — G928 Other toxic encephalopathy: Secondary | ICD-10-CM | POA: Diagnosis present

## 2022-06-30 DIAGNOSIS — Z6841 Body Mass Index (BMI) 40.0 and over, adult: Secondary | ICD-10-CM

## 2022-06-30 DIAGNOSIS — Z87891 Personal history of nicotine dependence: Secondary | ICD-10-CM

## 2022-06-30 DIAGNOSIS — W19XXXA Unspecified fall, initial encounter: Secondary | ICD-10-CM | POA: Diagnosis present

## 2022-06-30 DIAGNOSIS — R778 Other specified abnormalities of plasma proteins: Secondary | ICD-10-CM

## 2022-06-30 DIAGNOSIS — G4733 Obstructive sleep apnea (adult) (pediatric): Secondary | ICD-10-CM | POA: Diagnosis present

## 2022-06-30 DIAGNOSIS — E1151 Type 2 diabetes mellitus with diabetic peripheral angiopathy without gangrene: Secondary | ICD-10-CM | POA: Diagnosis present

## 2022-06-30 DIAGNOSIS — I252 Old myocardial infarction: Secondary | ICD-10-CM

## 2022-06-30 DIAGNOSIS — Z89421 Acquired absence of other right toe(s): Secondary | ICD-10-CM

## 2022-06-30 DIAGNOSIS — I5032 Chronic diastolic (congestive) heart failure: Secondary | ICD-10-CM | POA: Diagnosis not present

## 2022-06-30 DIAGNOSIS — Z86711 Personal history of pulmonary embolism: Secondary | ICD-10-CM | POA: Diagnosis present

## 2022-06-30 DIAGNOSIS — I509 Heart failure, unspecified: Secondary | ICD-10-CM | POA: Diagnosis present

## 2022-06-30 DIAGNOSIS — N1831 Chronic kidney disease, stage 3a: Secondary | ICD-10-CM | POA: Diagnosis present

## 2022-06-30 DIAGNOSIS — Z794 Long term (current) use of insulin: Secondary | ICD-10-CM

## 2022-06-30 DIAGNOSIS — G8929 Other chronic pain: Secondary | ICD-10-CM | POA: Diagnosis present

## 2022-06-30 DIAGNOSIS — F419 Anxiety disorder, unspecified: Secondary | ICD-10-CM | POA: Diagnosis not present

## 2022-06-30 DIAGNOSIS — D509 Iron deficiency anemia, unspecified: Secondary | ICD-10-CM | POA: Diagnosis present

## 2022-06-30 DIAGNOSIS — E1169 Type 2 diabetes mellitus with other specified complication: Principal | ICD-10-CM | POA: Diagnosis present

## 2022-06-30 DIAGNOSIS — E11649 Type 2 diabetes mellitus with hypoglycemia without coma: Secondary | ICD-10-CM | POA: Diagnosis not present

## 2022-06-30 DIAGNOSIS — D631 Anemia in chronic kidney disease: Secondary | ICD-10-CM | POA: Diagnosis present

## 2022-06-30 DIAGNOSIS — N1832 Chronic kidney disease, stage 3b: Secondary | ICD-10-CM | POA: Diagnosis present

## 2022-06-30 DIAGNOSIS — I130 Hypertensive heart and chronic kidney disease with heart failure and stage 1 through stage 4 chronic kidney disease, or unspecified chronic kidney disease: Secondary | ICD-10-CM | POA: Diagnosis present

## 2022-06-30 DIAGNOSIS — Z862 Personal history of diseases of the blood and blood-forming organs and certain disorders involving the immune mechanism: Secondary | ICD-10-CM

## 2022-06-30 DIAGNOSIS — M86171 Other acute osteomyelitis, right ankle and foot: Secondary | ICD-10-CM | POA: Diagnosis present

## 2022-06-30 DIAGNOSIS — E662 Morbid (severe) obesity with alveolar hypoventilation: Secondary | ICD-10-CM | POA: Diagnosis present

## 2022-06-30 DIAGNOSIS — E875 Hyperkalemia: Secondary | ICD-10-CM | POA: Diagnosis present

## 2022-06-30 DIAGNOSIS — Z951 Presence of aortocoronary bypass graft: Secondary | ICD-10-CM

## 2022-06-30 DIAGNOSIS — Z713 Dietary counseling and surveillance: Secondary | ICD-10-CM

## 2022-06-30 DIAGNOSIS — I5030 Unspecified diastolic (congestive) heart failure: Secondary | ICD-10-CM | POA: Diagnosis present

## 2022-06-30 DIAGNOSIS — G4700 Insomnia, unspecified: Secondary | ICD-10-CM | POA: Diagnosis present

## 2022-06-30 DIAGNOSIS — G9340 Encephalopathy, unspecified: Secondary | ICD-10-CM

## 2022-06-30 DIAGNOSIS — Z955 Presence of coronary angioplasty implant and graft: Secondary | ICD-10-CM

## 2022-06-30 DIAGNOSIS — Z833 Family history of diabetes mellitus: Secondary | ICD-10-CM

## 2022-06-30 DIAGNOSIS — Z8249 Family history of ischemic heart disease and other diseases of the circulatory system: Secondary | ICD-10-CM

## 2022-06-30 DIAGNOSIS — G894 Chronic pain syndrome: Secondary | ICD-10-CM | POA: Diagnosis present

## 2022-06-30 DIAGNOSIS — Z89512 Acquired absence of left leg below knee: Secondary | ICD-10-CM

## 2022-06-30 DIAGNOSIS — I739 Peripheral vascular disease, unspecified: Secondary | ICD-10-CM | POA: Diagnosis present

## 2022-06-30 DIAGNOSIS — E8729 Other acidosis: Secondary | ICD-10-CM | POA: Diagnosis present

## 2022-06-30 DIAGNOSIS — E860 Dehydration: Secondary | ICD-10-CM | POA: Diagnosis present

## 2022-06-30 DIAGNOSIS — E11621 Type 2 diabetes mellitus with foot ulcer: Secondary | ICD-10-CM | POA: Diagnosis present

## 2022-06-30 DIAGNOSIS — R410 Disorientation, unspecified: Secondary | ICD-10-CM

## 2022-06-30 DIAGNOSIS — E1122 Type 2 diabetes mellitus with diabetic chronic kidney disease: Secondary | ICD-10-CM | POA: Diagnosis present

## 2022-06-30 DIAGNOSIS — I7121 Aneurysm of the ascending aorta, without rupture: Secondary | ICD-10-CM | POA: Diagnosis present

## 2022-06-30 DIAGNOSIS — Z7902 Long term (current) use of antithrombotics/antiplatelets: Secondary | ICD-10-CM

## 2022-06-30 DIAGNOSIS — N179 Acute kidney failure, unspecified: Secondary | ICD-10-CM | POA: Diagnosis not present

## 2022-06-30 DIAGNOSIS — I714 Abdominal aortic aneurysm, without rupture, unspecified: Secondary | ICD-10-CM | POA: Diagnosis present

## 2022-06-30 DIAGNOSIS — J449 Chronic obstructive pulmonary disease, unspecified: Secondary | ICD-10-CM | POA: Diagnosis present

## 2022-06-30 DIAGNOSIS — K219 Gastro-esophageal reflux disease without esophagitis: Secondary | ICD-10-CM | POA: Diagnosis present

## 2022-06-30 DIAGNOSIS — I2510 Atherosclerotic heart disease of native coronary artery without angina pectoris: Secondary | ICD-10-CM | POA: Diagnosis present

## 2022-06-30 DIAGNOSIS — Z86718 Personal history of other venous thrombosis and embolism: Secondary | ICD-10-CM

## 2022-06-30 DIAGNOSIS — L97519 Non-pressure chronic ulcer of other part of right foot with unspecified severity: Secondary | ICD-10-CM

## 2022-06-30 DIAGNOSIS — E11628 Type 2 diabetes mellitus with other skin complications: Secondary | ICD-10-CM | POA: Diagnosis present

## 2022-06-30 DIAGNOSIS — I959 Hypotension, unspecified: Secondary | ICD-10-CM | POA: Diagnosis present

## 2022-06-30 DIAGNOSIS — Z993 Dependence on wheelchair: Secondary | ICD-10-CM

## 2022-06-30 DIAGNOSIS — I5033 Acute on chronic diastolic (congestive) heart failure: Secondary | ICD-10-CM | POA: Diagnosis present

## 2022-06-30 DIAGNOSIS — I96 Gangrene, not elsewhere classified: Secondary | ICD-10-CM

## 2022-06-30 DIAGNOSIS — R0689 Other abnormalities of breathing: Principal | ICD-10-CM

## 2022-06-30 DIAGNOSIS — L97419 Non-pressure chronic ulcer of right heel and midfoot with unspecified severity: Secondary | ICD-10-CM | POA: Diagnosis present

## 2022-06-30 DIAGNOSIS — Z7982 Long term (current) use of aspirin: Secondary | ICD-10-CM

## 2022-06-30 DIAGNOSIS — Z79891 Long term (current) use of opiate analgesic: Secondary | ICD-10-CM

## 2022-06-30 DIAGNOSIS — J9602 Acute respiratory failure with hypercapnia: Secondary | ICD-10-CM | POA: Diagnosis present

## 2022-06-30 DIAGNOSIS — Z882 Allergy status to sulfonamides status: Secondary | ICD-10-CM

## 2022-06-30 DIAGNOSIS — E785 Hyperlipidemia, unspecified: Secondary | ICD-10-CM | POA: Diagnosis present

## 2022-06-30 DIAGNOSIS — I25119 Atherosclerotic heart disease of native coronary artery with unspecified angina pectoris: Secondary | ICD-10-CM

## 2022-06-30 DIAGNOSIS — G9341 Metabolic encephalopathy: Secondary | ICD-10-CM | POA: Diagnosis present

## 2022-06-30 DIAGNOSIS — F32A Depression, unspecified: Secondary | ICD-10-CM | POA: Diagnosis present

## 2022-06-30 DIAGNOSIS — E1140 Type 2 diabetes mellitus with diabetic neuropathy, unspecified: Secondary | ICD-10-CM | POA: Diagnosis present

## 2022-06-30 DIAGNOSIS — Z79899 Other long term (current) drug therapy: Secondary | ICD-10-CM

## 2022-06-30 DIAGNOSIS — M1990 Unspecified osteoarthritis, unspecified site: Secondary | ICD-10-CM | POA: Diagnosis present

## 2022-06-30 DIAGNOSIS — Z7985 Long-term (current) use of injectable non-insulin antidiabetic drugs: Secondary | ICD-10-CM

## 2022-06-30 DIAGNOSIS — Z560 Unemployment, unspecified: Secondary | ICD-10-CM

## 2022-06-30 DIAGNOSIS — E1165 Type 2 diabetes mellitus with hyperglycemia: Secondary | ICD-10-CM | POA: Diagnosis present

## 2022-06-30 DIAGNOSIS — I248 Other forms of acute ischemic heart disease: Secondary | ICD-10-CM | POA: Diagnosis present

## 2022-06-30 DIAGNOSIS — M869 Osteomyelitis, unspecified: Secondary | ICD-10-CM

## 2022-06-30 DIAGNOSIS — T796XXA Traumatic ischemia of muscle, initial encounter: Secondary | ICD-10-CM | POA: Diagnosis present

## 2022-06-30 LAB — COMPREHENSIVE METABOLIC PANEL
ALT: 17 U/L (ref 0–44)
AST: 24 U/L (ref 15–41)
Albumin: 2.4 g/dL — ABNORMAL LOW (ref 3.5–5.0)
Alkaline Phosphatase: 107 U/L (ref 38–126)
Anion gap: 12 (ref 5–15)
BUN: 44 mg/dL — ABNORMAL HIGH (ref 6–20)
CO2: 22 mmol/L (ref 22–32)
Calcium: 8.8 mg/dL — ABNORMAL LOW (ref 8.9–10.3)
Chloride: 106 mmol/L (ref 98–111)
Creatinine, Ser: 1.93 mg/dL — ABNORMAL HIGH (ref 0.61–1.24)
GFR, Estimated: 42 mL/min — ABNORMAL LOW (ref >60)
Glucose, Bld: 162 mg/dL — ABNORMAL HIGH (ref 70–99)
Potassium: 6.2 mmol/L — ABNORMAL HIGH (ref 3.5–5.1)
Sodium: 140 mmol/L (ref 135–145)
Total Bilirubin: 0.5 mg/dL (ref 0.3–1.2)
Total Protein: 6.5 g/dL (ref 6.5–8.1)

## 2022-06-30 LAB — I-STAT VENOUS BLOOD GAS, ED
Acid-base deficit: 4 mmol/L — ABNORMAL HIGH (ref 0.0–2.0)
Acid-base deficit: 2 mmol/L (ref 0.0–2.0)
Bicarbonate: 25.5 mmol/L (ref 20.0–28.0)
Bicarbonate: 24.2 mmol/L (ref 20.0–28.0)
Calcium, Ion: 1.23 mmol/L (ref 1.15–1.40)
Calcium, Ion: 1.17 mmol/L (ref 1.15–1.40)
HCT: 31 % — ABNORMAL LOW (ref 39.0–52.0)
HCT: 28 % — ABNORMAL LOW (ref 39.0–52.0)
Hemoglobin: 10.5 g/dL — ABNORMAL LOW (ref 13.0–17.0)
Hemoglobin: 9.5 g/dL — ABNORMAL LOW (ref 13.0–17.0)
O2 Saturation: 69 %
O2 Saturation: 85 %
Potassium: 5.6 mmol/L — ABNORMAL HIGH (ref 3.5–5.1)
Potassium: 5.9 mmol/L — ABNORMAL HIGH (ref 3.5–5.1)
Sodium: 143 mmol/L (ref 135–145)
Sodium: 141 mmol/L (ref 135–145)
TCO2: 28 mmol/L (ref 22–32)
TCO2: 26 mmol/L (ref 22–32)
pCO2, Ven: 70.2 mmHg (ref 44–60)
pCO2, Ven: 50 mmHg (ref 44–60)
pH, Ven: 7.167 — CL (ref 7.25–7.43)
pH, Ven: 7.293 (ref 7.25–7.43)
pO2, Ven: 47 mmHg — ABNORMAL HIGH (ref 32–45)
pO2, Ven: 57 mmHg — ABNORMAL HIGH (ref 32–45)

## 2022-06-30 LAB — LACTIC ACID, PLASMA
Lactic Acid, Venous: 2.2 mmol/L (ref 0.5–1.9)
Lactic Acid, Venous: 1.2 mmol/L (ref 0.5–1.9)

## 2022-06-30 LAB — BASIC METABOLIC PANEL
Anion gap: 9 (ref 5–15)
BUN: 48 mg/dL — ABNORMAL HIGH (ref 6–20)
CO2: 24 mmol/L (ref 22–32)
Calcium: 8.2 mg/dL — ABNORMAL LOW (ref 8.9–10.3)
Chloride: 108 mmol/L (ref 98–111)
Creatinine, Ser: 2.54 mg/dL — ABNORMAL HIGH (ref 0.61–1.24)
GFR, Estimated: 30 mL/min — ABNORMAL LOW (ref >60)
Glucose, Bld: 67 mg/dL — ABNORMAL LOW (ref 70–99)
Potassium: 5.5 mmol/L — ABNORMAL HIGH (ref 3.5–5.1)
Sodium: 141 mmol/L (ref 135–145)

## 2022-06-30 LAB — CBC WITH DIFFERENTIAL/PLATELET
Abs Immature Granulocytes: 0.08 10*3/uL — ABNORMAL HIGH (ref 0.00–0.07)
Basophils Absolute: 0.1 10*3/uL (ref 0.0–0.1)
Basophils Relative: 1 %
Eosinophils Absolute: 0.3 10*3/uL (ref 0.0–0.5)
Eosinophils Relative: 3 %
HCT: 33.8 % — ABNORMAL LOW (ref 39.0–52.0)
Hemoglobin: 10 g/dL — ABNORMAL LOW (ref 13.0–17.0)
Immature Granulocytes: 1 %
Lymphocytes Relative: 14 %
Lymphs Abs: 1.5 10*3/uL (ref 0.7–4.0)
MCH: 23.7 pg — ABNORMAL LOW (ref 26.0–34.0)
MCHC: 29.6 g/dL — ABNORMAL LOW (ref 30.0–36.0)
MCV: 80.1 fL (ref 80.0–100.0)
Monocytes Absolute: 0.9 10*3/uL (ref 0.1–1.0)
Monocytes Relative: 9 %
Neutro Abs: 7.7 10*3/uL (ref 1.7–7.7)
Neutrophils Relative %: 72 %
Platelets: 290 10*3/uL (ref 150–400)
RBC: 4.22 MIL/uL (ref 4.22–5.81)
RDW: 18.3 % — ABNORMAL HIGH (ref 11.5–15.5)
WBC: 10.5 10*3/uL (ref 4.0–10.5)
nRBC: 0.2 % (ref 0.0–0.2)

## 2022-06-30 LAB — CBG MONITORING, ED
Glucose-Capillary: 89 mg/dL (ref 70–99)
Glucose-Capillary: 61 mg/dL — ABNORMAL LOW (ref 70–99)

## 2022-06-30 LAB — TROPONIN I (HIGH SENSITIVITY)
Troponin I (High Sensitivity): 55 ng/L — ABNORMAL HIGH (ref <18)
Troponin I (High Sensitivity): 67 ng/L — ABNORMAL HIGH (ref <18)

## 2022-06-30 LAB — ETHANOL: Alcohol, Ethyl (B): <10 mg/dL (ref <10)

## 2022-06-30 LAB — BRAIN NATRIURETIC PEPTIDE: B Natriuretic Peptide: 56.8 pg/mL (ref 0.0–100.0)

## 2022-06-30 MED ORDER — VANCOMYCIN HCL 10 G IV SOLR
2500.0000 mg | Freq: Once | INTRAVENOUS | Status: AC
  Administered 2022-06-30: 2500 mg via INTRAVENOUS
  Filled 2022-06-30: qty 25

## 2022-06-30 MED ORDER — INSULIN GLARGINE-YFGN 100 UNIT/ML ~~LOC~~ SOLN
25.0000 [IU] | Freq: Two times a day (BID) | SUBCUTANEOUS | Status: DC
  Administered 2022-06-30 – 2022-07-06 (×12): 25 [IU] via SUBCUTANEOUS
  Filled 2022-06-30 (×15): qty 0.25

## 2022-06-30 MED ORDER — VANCOMYCIN VARIABLE DOSE PER UNSTABLE RENAL FUNCTION (PHARMACIST DOSING): Status: DC

## 2022-06-30 MED ORDER — ALBUTEROL SULFATE (2.5 MG/3ML) 0.083% IN NEBU
INHALATION_SOLUTION | Freq: Four times a day (QID) | RESPIRATORY_TRACT | Status: DC
Start: 2022-06-30 — End: 2022-07-01
  Administered 2022-07-01 (×3): 2.5 mg via RESPIRATORY_TRACT
  Filled 2022-06-30 (×4): qty 3

## 2022-06-30 MED ORDER — SODIUM CHLORIDE 0.9 % IV SOLN
2.0000 g | Freq: Three times a day (TID) | INTRAVENOUS | Status: DC
  Administered 2022-06-30 – 2022-07-07 (×21): 2 g via INTRAVENOUS
  Filled 2022-06-30 (×21): qty 12.5

## 2022-06-30 MED ORDER — CLOPIDOGREL BISULFATE 75 MG PO TABS
75.0000 mg | ORAL_TABLET | Freq: Every day | ORAL | Status: DC
  Administered 2022-07-01: 75 mg via ORAL
  Filled 2022-06-30: qty 1

## 2022-06-30 MED ORDER — SODIUM CHLORIDE 0.9% FLUSH
3.0000 mL | Freq: Two times a day (BID) | INTRAVENOUS | Status: DC
  Administered 2022-07-01 – 2022-07-13 (×22): 3 mL via INTRAVENOUS

## 2022-06-30 MED ORDER — ATORVASTATIN CALCIUM 80 MG PO TABS
80.0000 mg | ORAL_TABLET | Freq: Every day | ORAL | Status: DC
  Administered 2022-07-01: 80 mg via ORAL
  Filled 2022-06-30: qty 1

## 2022-06-30 MED ORDER — CARVEDILOL 3.125 MG PO TABS
3.1250 mg | ORAL_TABLET | Freq: Two times a day (BID) | ORAL | Status: DC
  Administered 2022-07-01 – 2022-07-13 (×25): 3.125 mg via ORAL
  Filled 2022-06-30 (×25): qty 1

## 2022-06-30 MED ORDER — ASPIRIN 81 MG PO TBEC
81.0000 mg | DELAYED_RELEASE_TABLET | Freq: Every day | ORAL | Status: DC
  Administered 2022-07-01 – 2022-07-13 (×12): 81 mg via ORAL
  Filled 2022-06-30 (×13): qty 1

## 2022-06-30 MED ORDER — RANOLAZINE ER 500 MG PO TB12
500.0000 mg | ORAL_TABLET | Freq: Two times a day (BID) | ORAL | Status: DC
  Administered 2022-07-01 – 2022-07-13 (×25): 500 mg via ORAL
  Filled 2022-06-30 (×25): qty 1

## 2022-06-30 MED ORDER — INSULIN ASPART 100 UNIT/ML IJ SOLN: 0.0000 [IU] | Freq: Three times a day (TID) | INTRAMUSCULAR | Status: DC

## 2022-06-30 MED ORDER — LACTATED RINGERS IV BOLUS: 1000.0000 mL | Freq: Once | INTRAVENOUS | Status: DC

## 2022-06-30 MED ORDER — SODIUM ZIRCONIUM CYCLOSILICATE 10 G PO PACK
10.0000 g | PACK | Freq: Once | ORAL | Status: AC
  Administered 2022-06-30: 10 g via ORAL
  Filled 2022-06-30: qty 1

## 2022-06-30 MED ORDER — NALOXONE HCL 4 MG/0.1ML NA LIQD: 4.0000 mg | NASAL | Status: DC | PRN

## 2022-06-30 MED ORDER — ONDANSETRON HCL 4 MG PO TABS
4.0000 mg | ORAL_TABLET | Freq: Four times a day (QID) | ORAL | Status: DC | PRN
  Administered 2022-07-05: 4 mg via ORAL
  Filled 2022-06-30: qty 1

## 2022-06-30 MED ORDER — DEXTROSE 50 % IV SOLN
1.0000 | Freq: Once | INTRAVENOUS | Status: AC
  Administered 2022-06-30: 50 mL via INTRAVENOUS
  Filled 2022-06-30: qty 50

## 2022-06-30 MED ORDER — ACETAMINOPHEN 650 MG RE SUPP: 650.0000 mg | Freq: Four times a day (QID) | RECTAL | Status: DC | PRN

## 2022-06-30 MED ORDER — PANTOPRAZOLE SODIUM 40 MG PO TBEC
40.0000 mg | DELAYED_RELEASE_TABLET | Freq: Every day | ORAL | Status: DC
  Administered 2022-07-01 – 2022-07-13 (×13): 40 mg via ORAL
  Filled 2022-06-30 (×13): qty 1

## 2022-06-30 MED ORDER — SODIUM CHLORIDE 0.9 % IV SOLN: INTRAVENOUS | Status: AC

## 2022-06-30 MED ORDER — ACETAMINOPHEN 325 MG PO TABS
650.0000 mg | ORAL_TABLET | Freq: Four times a day (QID) | ORAL | Status: DC | PRN
  Administered 2022-07-06: 650 mg via ORAL
  Filled 2022-06-30 (×2): qty 2

## 2022-06-30 MED ORDER — ICOSAPENT ETHYL 1 G PO CAPS
2.0000 g | ORAL_CAPSULE | Freq: Two times a day (BID) | ORAL | Status: DC
  Administered 2022-07-01 – 2022-07-13 (×25): 2 g via ORAL
  Filled 2022-06-30 (×27): qty 2

## 2022-06-30 MED ORDER — SODIUM CHLORIDE 0.9 % IV BOLUS: 1000.0000 mL | Freq: Once | INTRAVENOUS | Status: DC

## 2022-06-30 MED ORDER — DOXYCYCLINE HYCLATE 100 MG PO TABS: 100.0000 mg | ORAL_TABLET | Freq: Two times a day (BID) | ORAL | Status: DC

## 2022-06-30 MED ORDER — ENOXAPARIN SODIUM 80 MG/0.8ML IJ SOSY
80.0000 mg | PREFILLED_SYRINGE | INTRAMUSCULAR | Status: DC
  Administered 2022-06-30 – 2022-07-12 (×13): 80 mg via SUBCUTANEOUS
  Filled 2022-06-30 (×14): qty 0.8

## 2022-06-30 MED ORDER — METRONIDAZOLE 500 MG/100ML IV SOLN
500.0000 mg | Freq: Two times a day (BID) | INTRAVENOUS | Status: DC
  Administered 2022-06-30 – 2022-07-08 (×16): 500 mg via INTRAVENOUS
  Filled 2022-06-30 (×16): qty 100

## 2022-06-30 MED ORDER — ONDANSETRON HCL 4 MG/2ML IJ SOLN
4.0000 mg | Freq: Four times a day (QID) | INTRAMUSCULAR | Status: DC | PRN
  Administered 2022-07-06: 4 mg via INTRAVENOUS
  Filled 2022-06-30: qty 2

## 2022-06-30 NOTE — ED Provider Notes (Signed)
[facility]   CSN: [id number] Arrival date & time: 06/30/22  1203     History  Chief Complaint  Patient presents with   Fatigue    Joseph Albrighterry W [name] is a 51 y.o. male.  The history is provided by the patient, the spouse, the EMS personnel and medical records. No language interpreter was used.    51 year old male multiple comorbidity which includes diabetes, CHF, CAD AD, chronic opiate use, COPD, prior MI on Plavix, AAA sent here from urgent care center via EMS for evaluation of generalized fatigue.  Patient is a poor historian.  History obtained through wife who is at bedside.  Both are poor historian however it sounds like patient was going to his primary care doctor today with his wife when he was found to be nodding off and appears more confused than baseline.  To check his blood pressure and it was quite low and therefore patient was given IV fluid and brought here to the ER for further assessment.  Unfortunately patient has multiple comorbidities and it is difficult to pinpoint if anything specifically has changed.  He does have chronic cough that has been doing with that for several months.  He denies having fever but does endorse pain throughout his body which is not unusual in which he takes high doses of opiate pain medication on regular basis.  Denies increasing opiate use.  He has a chronic right nonhealing wound and apparently had had an MRI of his right foot done last week.  He was told that he is doing better.  Wife felt that his wound is doing better however patient prefers to have his foot amputated due to his chronic pain discomfort.  He does not endorse any urinary discomfort or abdominal pain at this time.  Additional history was difficult to obtain.  Denies any active chest pain  Home Medications Prior to Admission medications   Medication Sig Start Date End Date Taking? Authorizing Provider  acetaminophen (TYLENOL) 500  MG tablet Take 500-1,000 mg by mouth every 6 (six) hours as needed (headaches/pain).    [provider]  albuterol (VENTOLIN HFA) 108 (90 Base) MCG/ACT inhaler Inhale 2 puffs into the lungs every 6 (six) hours.    [provider]  ALPRAZolam Prudy Feeler(XANAX) 1 MG tablet Take 2 mg by mouth 2 (two) times daily as needed for anxiety. 03/24/22   [provider]  aspirin EC 81 MG tablet Take 81 mg by mouth in the morning. Swallow whole.    [provider]  atorvastatin (LIPITOR) 80 MG tablet Take 1 tablet (80 mg total) by mouth daily at 6 PM. 05/31/19   Corrin ParkerGoodrich, Callie E, PA-C  buPROPion (WELLBUTRIN XL) 150 MG 24 hr tablet Take 150 mg by mouth in the morning and at bedtime.    [provider]  carvedilol (COREG) 3.125 MG tablet Take 3.125 mg by mouth 2 (two) times daily. 02/22/22   [provider]  clopidogrel (PLAVIX) 75 MG tablet Take 1 tablet (75 mg total) by mouth daily with breakfast. 10/11/20   Lars Mageollins, Emma M, PA-C  doxycycline (VIBRA-TABS) 100 MG tablet Take 1 tablet (100 mg total) by mouth 2 (two) times daily. 06/09/22   Adonis HugueninZamora, Erin R, NP  escitalopram (LEXAPRO) 10 MG tablet Take 10 mg by mouth in the morning. 01/27/22   [provider]  fluconazole (DIFLUCAN) 100 MG tablet Take 100 mg by mouth every Monday. 03/08/22   [provider]  fluticasone (FLONASE) 50 MCG/ACT nasal spray Place 2 sprays into both nostrils daily.    [provider]  furosemide (LASIX) 40 MG tablet TAKE 1 TABLET(40 MG) BY MOUTH DAILY 05/03/22   Jodelle Red, MD  GVOKE HYPOPEN 2-PACK 1 MG/0.2ML SOAJ Inject 1 mg into the skin as needed (low blood sugar). 04/21/22   [provider]  icosapent Ethyl (VASCEPA) 1 g capsule Take 2 capsules (2 g total) by mouth 2 (two) times daily. 09/02/21   Chilton Si, MD  insulin aspart (NOVOLOG) 100 UNIT/ML FlexPen Inject 6 Units into the skin 3 (three) times daily with meals. Per sliding scale Patient taking  differently: Inject 0-20 Units into the skin 3 (three) times daily with meals. Per sliding scale 11/25/21   Rhetta Mura, MD  LANTUS SOLOSTAR 100 UNIT/ML Solostar Pen Inject 50 Units into the skin in the morning and at bedtime. 04/11/22   [provider]  liraglutide (VICTOZA) 18 MG/3ML SOPN Inject 1.8 mg into the skin daily. Patient taking differently: Inject 1.2 mg into the skin every evening. 07/17/21   Sherryll Burger, Pratik D, DO  loratadine (CLARITIN) 10 MG tablet Take 10 mg by mouth daily. 12/22/21   [provider]  meclizine (ANTIVERT) 25 MG tablet Take 25-50 mg by mouth See admin instructions. Take 2 tablets (50 mg) by mouth in the morning & take 1 tablet (25 mg) by mouth at night. 02/22/22   [provider]  metFORMIN (GLUCOPHAGE) 1000 MG tablet Take 1 tablet (1,000 mg total) by mouth 2 (two) times daily with a meal. 11/05/19   Leone Brand, NP  mupirocin ointment (BACTROBAN) 2 % Apply 1 Application topically 2 (two) times daily as needed (wound care).    [provider]  naloxone [facility]) nasal spray 4 mg/0.1 mL Place 4 mg into the nose as needed (overdose).    [provider]  nitroGLYCERIN (NITROSTAT) 0.4 MG SL tablet Place 0.4 mg under the tongue every 5 (five) minutes x 3 doses as needed for chest pain.    [provider]  oxyCODONE (ROXICODONE) 15 MG immediate release tablet Take 15 mg by mouth 5 (five) times daily. 04/01/22   [provider]  pantoprazole (PROTONIX) 40 MG tablet Take 1 tablet (40 mg total) by mouth daily. Patient taking differently: Take 80 mg by mouth daily before breakfast. 09/23/17   Hongalgi, Maximino Greenland, MD  Polyethyl Glycol-Propyl Glycol (LUBRICANT EYE DROPS) 0.4-0.3 % SOLN Place 1-2 drops into both eyes 3 (three) times daily as needed (dry/irritated allergy eyes.).    [provider]  pregabalin (LYRICA) 150 MG capsule Take 2 capsules (300 mg total) by mouth in the morning. Patient taking differently:  Take 150-300 mg by mouth See admin instructions. Take 2 capsules (300 mg) by mouth in the morning & take 1 capsule (150 mg) by mouth at night. 11/25/21   Rhetta Mura, MD  promethazine (PHENERGAN) 25 MG tablet Take 25 mg by mouth every 4 (four) hours as needed for nausea or vomiting. 02/26/22   [provider]  ranolazine (RANEXA) 500 MG 12 hr tablet Take 1 tablet (500 mg total) by mouth 2 (two) times daily. 09/02/21   Chilton Si, MD      Allergies    Sulfa antibiotics    Review of Systems   Review of Systems  Unable to perform ROS: Mental status change  All other systems reviewed and are negative.   Physical Exam Updated Vital Signs BP (!) 89/60   Pulse  95   Temp 98.7 F (37.1 C) (Oral)   Resp 15   Ht 6\' 5"  (1.956 m)   Wt (!) 158.8 kg   SpO2 99%   BMI 41.50 kg/m  Physical Exam Vitals and nursing note reviewed.  Constitutional:      General: He is not in acute distress.    Appearance: He is well-developed.     Comments: Chronically ill appearing male appears drowsy but arousable.  HENT:     Head: Atraumatic.  Eyes:     Conjunctiva/sclera: Conjunctivae normal.  Cardiovascular:     Rate and Rhythm: Normal rate and regular rhythm.     Pulses: Normal pulses.     Heart sounds: Normal heart sounds.  Pulmonary:     Breath sounds: Normal breath sounds. No wheezing.  Abdominal:     Palpations: Abdomen is soft.  Musculoskeletal:        General: Tenderness (Right foot: Chronic appearing pressure ulcer noted to the heel with foul-smelling and macerated skin changes noted.  Multiple amputated toes noted.  Diminished but palpable dorsalis pedis pulse) present.     Cervical back: Neck supple.     Comments: Left BKA  Skin:    Findings: No rash.  Neurological:     Mental Status: He is oriented to person, place, and time.     Comments: Although patient is drowsy, he is alert and oriented x3     ED Results / Procedures / Treatments   Labs (all labs ordered  are listed, but only abnormal results are displayed) Labs Reviewed  LACTIC ACID, PLASMA - Abnormal; Notable for the following components:      Result Value   Lactic Acid, Venous 2.2 (*)    All other components within normal limits  COMPREHENSIVE METABOLIC PANEL - Abnormal; Notable for the following components:   Potassium 6.2 (*)    Glucose, Bld 162 (*)    BUN 44 (*)    Creatinine, Ser 1.93 (*)    Calcium 8.8 (*)    Albumin 2.4 (*)    GFR, Estimated 42 (*)    All other components within normal limits  CBC WITH DIFFERENTIAL/PLATELET - Abnormal; Notable for the following components:   Hemoglobin 10.0 (*)    HCT 33.8 (*)    MCH 23.7 (*)    MCHC 29.6 (*)    RDW 18.3 (*)    Abs Immature Granulocytes 0.08 (*)    All other components within normal limits  I-STAT VENOUS BLOOD GAS, ED - Abnormal; Notable for the following components:   pH, Ven 7.167 (*)    pCO2, Ven 70.2 (*)    pO2, Ven 47 (*)    Acid-base deficit 4.0 (*)    Potassium 5.6 (*)    HCT 31.0 (*)    Hemoglobin 10.5 (*)    All other components within normal limits  TROPONIN I (HIGH SENSITIVITY) - Abnormal; Notable for the following components:   Troponin I (High Sensitivity) 55 (*)    All other components within normal limits  TROPONIN I (HIGH SENSITIVITY) - Abnormal; Notable for the following components:   Troponin I (High Sensitivity) 67 (*)    All other components within normal limits  CULTURE, BLOOD (ROUTINE X 2)  CULTURE, BLOOD (ROUTINE X 2)  URINE CULTURE  ETHANOL  LACTIC ACID, PLASMA  BLOOD GAS, VENOUS  RAPID URINE DRUG SCREEN, HOSP PERFORMED  URINALYSIS, ROUTINE W REFLEX MICROSCOPIC  CBG MONITORING, ED  I-STAT VENOUS BLOOD GAS, ED  EKG EKG Interpretation  Date/Time:  Wednesday June 30 2022 12:22:38 EDT Ventricular Rate:  92 PR Interval:  170 QRS Duration: 102 QT Interval:  330 QTC Calculation: 408 R Axis:   -48 Text Interpretation: Normal sinus rhythm Left axis deviation Inferior infarct , age  undetermined Anterolateral infarct , age undetermined Abnormal ECG When compared with ECG of 17-Jun-2022 07:42, PREVIOUS ECG IS PRESENT Confirmed by Ernie Avena ([id number]) on 06/30/2022 4:46:08 PM  Radiology DG Foot Complete Right  Result Date: 06/30/2022 CLINICAL DATA:  Wound Shortness of breath Chest pain Right foot pain EXAM: RIGHT FOOT COMPLETE - 3+ VIEW COMPARISON:  04/14/2022 FINDINGS: Interval worsening of moderate dorsal midfoot soft tissue swelling. Large ulcer noted overlying the heel. No osseous erosions are present to definitively indicate osteomyelitis. Postsurgical changes of first toe amputation again seen. IMPRESSION: 1. Interval worsening of dorsal midfoot soft tissue swelling. 2. Large heel ulcer appears worse since prior exam. Electronically Signed   By: Acquanetta Belling M.D.   On: 06/30/2022 14:11   DG Chest 1 View  Result Date: 06/30/2022 CLINICAL DATA:  Shortness of breath. EXAM: CHEST  1 VIEW COMPARISON:  None Available. FINDINGS: The heart size and mediastinal contours are within normal limits. Median sternotomy. Linear left basilar opacities. No confluent consolidation. No visible pleural effusions or pneumothorax. No acute osseous abnormality. IMPRESSION: Subsegmental left basilar atelectasis. Otherwise, no evidence of acute cardiopulmonary disease. Electronically Signed   By: Feliberto Harts M.D.   On: 06/30/2022 14:08    Procedures .Critical Care  Performed by: Fayrene Helper, PA-C Authorized by: Fayrene Helper, PA-C   Critical care provider statement:    Critical care time (minutes):  45   Critical care was time spent personally by me on the following activities:  Development of treatment plan with patient or surrogate, discussions with consultants, evaluation of patient's response to treatment, examination of patient, ordering and review of laboratory studies, ordering and review of radiographic studies, ordering and performing treatments and interventions, pulse oximetry,  re-evaluation of patient's condition and review of old charts     Medications Ordered in ED Medications  vancomycin (VANCOCIN) 2,500 mg in sodium chloride 0.9 % 500 mL IVPB (2,500 mg Intravenous New Bag/Given 06/30/22 1744)  ceFEPIme (MAXIPIME) 2 g in sodium chloride 0.9 % 100 mL IVPB (0 g Intravenous Stopped 06/30/22 1737)  sodium zirconium cyclosilicate (LOKELMA) packet 10 g (10 g Oral Given 06/30/22 1750)    ED Course/ Medical Decision Making/ A&P Clinical Course as of 06/30/22 1709  Wed Jun 30, 2022  1702 pH, Ven(!!): 7.167 [JL]  1702 pCO2, Ven(!!): 70.2 [JL]    Clinical Course User Index [JL] Ernie Avena, MD                           Medical Decision Making Amount and/or Complexity of Data Reviewed Labs:  Decision-making details documented in ED Course.  Risk Prescription drug management.   BP 97/84   Pulse 84   Temp 98.7 F (37.1 C) (Oral)   Resp 16   Ht 6\' 5"  (1.956 m)   Wt (!) 158.8 kg   SpO2 99%   BMI 41.50 kg/m   4:37 PM This is a 51 year old male with multiple comorbidities involving just about every single organ system who is here for increased confusion and fatigue of unknown period of time but was sent here from his PCP office for further assessment.  He has a chronic nonhealing wound to his right  heel wife mention he had an MRI done last week and was told that it was showing improvement.  Patient however wished to have his foot amputated and as it causes persistent pain and is foul-smelling.  His orthopedist is Dr. [name].  He has had prior left AKA.  He is having trouble ambulating using his orthotic and having trouble bearing weight on his right foot.  He was also noted to be hypotensive with initial blood pressure in the 80s systolic improved with IV fluid.  He does have history of CHF therefore fluid resuscitation has to be gentle.  Patient is drowsy during my exam however he is arousable and answering question.  He is currently afebrile and no  hypoxia.  Labs obtained and are remarkable for elevated lactic acid of 2.2, and a creatinine of 1.93 suggestive of dehydration versus sepsis.  Patient also has elevated troponin of 55 however EKG is without acute ischemic changes and patient is currently on Plavix.  Will trend troponin.  He has had elevated potassium of 6.2.  K+ on venous blood gas is 5.6. Will give Lokelma to help reduce it. No EKG changes. X-ray of the right foot reveal interval worsening of the dorsal midfoot soft tissue swelling as well as a large heel ulcer appears worse since prior exam from prior x-ray on 04/14/2022.  The wound is foul-smelling.  Even though there is no definitive changes to suggest osteomyelitis, given the low blood pressure and possible source of infection, will initiate antibiotic and initiate code sepsis.  His drowsiness could also be related to his chronic opiate use.  Will monitor closely.  I have initiated broad-spectrum antibiotic which include vancomycin and cefepime.  I have reviewed patient's previous MRI of the right hand without contrast that was done on 7/6 which shows finding concerning for early osteomyelitis.   5:10 PM Venous blood gas came back at 7.167 suggestive likely respiratory acidosis.  Suspect component of hypercapnia causing his drowsiness, will place patient on BiPAP.  7:22 PM Appreciate consultation from [facility] hospitalist, Dr. Alinda Money, who agrees to see and will admit patient for further management of his condition.  Please note his troponin is trending up, will repeat EKG however I suspect this is likely demand ischemia in the setting of hypercapnia.  At this time patient is protecting his airway, does not seems to require intubation.  This patient presents to the ED for concern of lethargy, this involves an extensive number of treatment options, and is a complaint that carries with it a high risk of complications and morbidity.  The differential diagnosis includes hypercapnea,  opiate induced delirium, sepsis, electrolytes imbalance.   Co morbidities that complicate the patient evaluation copd  Cad  Chf  DM Additional history obtained:  Additional history obtained from wife at bedside External records from outside source obtained and reviewed including EMR, labs and imaging  Lab Tests:  I Ordered, and personally interpreted labs.  The pertinent results include:  as above  Imaging Studies ordered:  I ordered imaging studies including xray of R foot I independently visualized and interpreted imaging which showed worsensing heel ulcer I agree with the radiologist interpretation  Cardiac Monitoring:  The patient was maintained on a cardiac monitor.  I personally viewed and interpreted the cardiac monitored which showed an underlying rhythm of: NSR  Medicines ordered and prescription drug management:  I ordered medication including vanc/cefepime  for foot ulcer Reevaluation of the patient after these medicines showed that the patient stayed the same  I have reviewed the patients home medicines and have made adjustments as needed  Test Considered: as above  Critical Interventions: IV abx  IVF  BiPap  Consultations Obtained:  I requested consultation with the [facility] Hospitalist Dr. Alinda Money,  and discussed lab and imaging findings as well as pertinent plan - they recommend: admission  Problem List / ED Course: hypercapnia  Demand ischemia  Dehydration  Osteomyelitis R heel  AKI  Reevaluation:  After the interventions noted above, I reevaluated the patient and found that they have :improved  Social Determinants of Health: tobacco use  Dispostion:  After consideration of the diagnostic results and the patients response to treatment, I feel that the patent would benefit from admission.         Final Clinical Impression(s) / ED Diagnoses Final diagnoses:  Hypercapnia  Right foot ulcer, with unspecified severity (HCC)  Dehydration   Delirium  Elevated troponin    Rx / DC Orders ED Discharge Orders     None         Fayrene Helper, PA-C 06/30/22 1924    Ernie Avena, MD 06/30/22 1958

## 2022-06-30 NOTE — ED Triage Notes (Addendum)
BIB EMS from UC for having nausea and was nodding off.  Extensive hx and unsure if related to the 7 narcotics he takes or sleep apnea or something else going on. Patient went to UC for cut on hand and having sob and chest pain on and off for the past few weeks. .  Recent BKA on left.

## 2022-06-30 NOTE — H&P (Addendum)
History and Physical   Joseph [name] [id number] DOB: [birth year] DOA: 06/30/2022  PCP: [name], NP   Patient coming from: Home and urgent care  Chief Complaint: AMS, Nausea, SOB, Chest Pain, Hand injury  HPI: Joseph [name] is a 51 y.o. male with medical history significant of hyperlipidemia, CAD status post CABG, PE, diastolic CHF, hypertension, PAD, aortic aneurysm, diabetes, OSA, COPD, anxiety, GERD, chronic pain presenting with altered mental status.  History obtained with assistance of family and chart review due to patient's intermittent altered mentation.  Patient initially had reported to urgent care earlier today per reports due to migraine 3 days, nausea with increased lethargy and confusion. Had recent cardiology eval due chest pain and SOB, had cath done but not intervention. He had been noted to be drowsy there and nodding off with some nausea and was sent to the ED for further evaluation.  Other than the above symptoms not reporting any new symptoms beyond a chronic cough and chronic non healing wound which has been stable.  He does have chronic pain and takes chronic opioids which could be contributing.  Family also reports fall this morning, but no injury and fell flat on his bottom, and decreased PO intake. Also reporting some LE edema and SOB.  Patient is arousable and denies fevers, chills, abdominal pain, constipation, diarrhea, vomiting  ED Course: Vital signs in the ED significant for initial blood pressure in the [id number] 90 systolic improved to the 123XX123 systolic with IV fluids.  Lab work-up included CMP with potassium of 6.2, BUN elevated to 44 and creatinine 1.93 from baseline 1.5, glucose 162, calcium 8.8, albumin 2.4.  CBC with hemoglobin stable at 10.  Troponin elevated at 55 and then 67 on repeat.  Lactic acid mildly elevated at 2.2 with repeat pending.  Ethanol level normal.  UDS, urinalysis, urine culture, blood cultures pending.  Chest x-ray showed left  basilar atelectasis but no other acute abnormality.  Right foot x-ray showed increase soft tissue swelling and large heel ulcer that appeared worse from previous.  Patient received vancomycin, cefepime, Lokelma, IV fluids in the ED.  Review of Systems: As per HPI otherwise all other systems reviewed and are negative.  Past Medical History:  Diagnosis Date   Aneurysm of ascending aorta ([place]) 09/14/2021   Anxiety    Arthritis    "knees, shoulders, hips, ankles" (07/29/2016)   Asthma    Bicuspid aortic valve 09/14/2021   CAD (coronary artery disease)    a. 2017: s/p BMS to distal Cx; b. LHC 05/30/2019: 80% mid, distal RCA s/p DES, 30% narrowing of d LM, widely patent LAD w/ luminal irregularities, widely patent stent in [place]  w/ 90+% stenosis distal to stent beofre small trifurcating obtuse marginal (potentially area of restenosis)   Chronic bronchitis (HCC)    Chronic ulcer of great toe of right foot ([place]) 09/09/2017   Chronic ulcer of right great toe ([place]) 09/19/2017   COPD (chronic obstructive pulmonary disease) (HCC)    Depression    DVT (deep venous thrombosis) (HCC)    GERD (gastroesophageal reflux disease)    Hyperlipidemia    Hypertension    Myocardial infarction ([place]) 1996   "light one"   PE (pulmonary embolism) 04/2013   On chronic Xarelto   Peripheral nerve disease    Pneumonia "several times"   Sleep apnea    Type II diabetes mellitus ([place])     Past Surgical History:  Procedure Laterality Date   ABDOMINAL AORTOGRAM W/LOWER EXTREMITY  N/A 10/10/2020   Procedure: ABDOMINAL AORTOGRAM W/LOWER EXTREMITY;  Surgeon: Elam Dutch, MD;  Location: [place] [facility];  Service: Cardiovascular;  Laterality: N/A;   AMPUTATION Right 09/21/2017   Procedure: RIGHT GREAT TOE AMPUTATION, POSSIBLE VAC;  Surgeon: Leandrew Koyanagi, MD;  Location: [place];  Service: Orthopedics;  Laterality: Right;   AMPUTATION Left 08/13/2020   Procedure: LEFT FOOT 4TH RAY AMPUTATION;  Surgeon: Newt Minion, MD;   Location: [place];  Service: Orthopedics;  Laterality: Left;   AMPUTATION Left 11/20/2021   Procedure: LEFT TRANSMETATARSAL AMPUTATION;  Surgeon: Newt Minion, MD;  Location: [place];  Service: Orthopedics;  Laterality: Left;   AMPUTATION Left 01/08/2022   Procedure: LEFT BELOW KNEE AMPUTATION;  Surgeon: Newt Minion, MD;  Location: [place];  Service: Orthopedics;  Laterality: Left;   AORTOGRAM Bilateral 03/13/2021   Procedure: ABDOMINAL AORTOGRAM WITH Left LOWER EXTREMITY RUNOFF;  Surgeon: Cherre Robins, MD;  Location: [facility];  Service: Vascular;  Laterality: Bilateral;   APPLICATION OF WOUND VAC  01/08/2022   Procedure: APPLICATION OF WOUND VAC;  Surgeon: Newt Minion, MD;  Location: [place];  Service: Orthopedics;;   CARDIAC CATHETERIZATION  2006   [place]   "@ [facility]; when I had my heart attack"   CARDIAC CATHETERIZATION N/A 07/29/2016   Procedure: Left Heart Cath and Coronary Angiography;  Surgeon: Lorretta Harp, MD;  Location: [place] [facility];  Service: Cardiovascular;  Laterality: N/A;   CARDIAC CATHETERIZATION N/A 07/29/2016   Procedure: Coronary Stent Intervention;  Surgeon: Lorretta Harp, MD;  Location: [place] [facility];  Service: Cardiovascular;  Laterality: N/A;   CARPAL TUNNEL RELEASE Bilateral    CORONARY ANGIOPLASTY WITH STENT PLACEMENT  07/29/2016   CORONARY ARTERY BYPASS GRAFT N/A 07/24/2020   Procedure: CORONARY ARTERY BYPASS GRAFTING (CABG), ON PUMP, TIMES FOUR, USING LEFT INTERNAL MAMMARY ARTERY AND ENDOSCOPICALLY HARVESTED RIGHT GREATER SAPHENOUS VEIN;  Surgeon: Lajuana Matte, MD;  Location: [place];  Service: Open Heart Surgery;  Laterality: N/A;  FLOW TAC   CORONARY STENT INTERVENTION N/A 05/30/2019   Procedure: CORONARY STENT INTERVENTION;  Surgeon: Belva Crome, MD;  Location: [place] [facility];  Service: Cardiovascular;  Laterality: N/A;   CORONARY STENT INTERVENTION N/A 11/02/2019   Procedure: CORONARY STENT INTERVENTION;  Surgeon:  Wellington Hampshire, MD;  Location: [place] [facility];  Service: Cardiovascular;  Laterality: N/A;   ESOPHAGOGASTRODUODENOSCOPY N/A 09/22/2017   Procedure: ESOPHAGOGASTRODUODENOSCOPY (EGD);  Surgeon: Ladene Artist, MD;  Location[facility];  Service: Endoscopy;  Laterality: N/A;   INTRAVASCULAR PRESSURE WIRE/FFR STUDY N/A 07/22/2020   Procedure: INTRAVASCULAR PRESSURE WIRE/FFR STUDY;  Surgeon: Leonie Man, MD;  Location: [place] [facility];  Service: Cardiovascular;  Laterality: N/A;   KNEE ARTHROSCOPY Bilateral    "2 on left; 1 on the right"   LEFT HEART CATH AND CORONARY ANGIOGRAPHY N/A 11/02/2019   Procedure: LEFT HEART CATH AND CORONARY ANGIOGRAPHY;  Surgeon: Wellington Hampshire, MD;  Location: [place] [facility];  Service: Cardiovascular;  Laterality: N/A;   LEFT HEART CATH AND CORONARY ANGIOGRAPHY N/A 07/22/2020   Procedure: LEFT HEART CATH AND CORONARY ANGIOGRAPHY;  Surgeon: Leonie Man, MD;  Location: [place] [facility];  Service: Cardiovascular;  Laterality: N/A;   LEFT HEART CATH AND CORS/GRAFTS ANGIOGRAPHY N/A 08/17/2021   Procedure: LEFT HEART CATH AND CORS/GRAFTS ANGIOGRAPHY;  Surgeon: Nelva Bush, MD;  Location: [place] [facility];  Service: Cardiovascular;  Laterality: N/A;   PERIPHERAL VASCULAR INTERVENTION Left 10/11/2020   popliteal and SFA stent placement    PERIPHERAL VASCULAR INTERVENTION Left 10/10/2020   Procedure: PERIPHERAL VASCULAR INTERVENTION;  Surgeon: Elam Dutch, MD;  Location: [place] [facility];  Service: Cardiovascular;  Laterality: Left;   RIGHT/LEFT HEART CATH AND CORONARY ANGIOGRAPHY N/A 05/30/2019   Procedure: RIGHT/LEFT HEART CATH AND CORONARY ANGIOGRAPHY;  Surgeon: Belva Crome, MD;  Location: [place] [facility];  Service: Cardiovascular;  Laterality: N/A;   RIGHT/LEFT HEART CATH AND CORONARY/GRAFT ANGIOGRAPHY N/A 06/17/2022   Procedure: RIGHT/LEFT HEART CATH AND CORONARY/GRAFT ANGIOGRAPHY;  Surgeon: Troy Sine, MD;  Location: [place] [facility];  Service: Cardiovascular;  Laterality: N/A;   SHOULDER OPEN ROTATOR CUFF REPAIR Bilateral    TEE WITHOUT CARDIOVERSION N/A 07/24/2020   Procedure: TRANSESOPHAGEAL ECHOCARDIOGRAM (TEE);  Surgeon: Lajuana Matte, MD;  Location: [place];  Service: Open Heart Surgery;  Laterality: N/A;    Social History  reports that he quit smoking about 1 years ago. His smoking use included cigarettes. He has a 34.00 pack-year smoking history. He has quit using smokeless tobacco.  His smokeless tobacco use included snuff and chew. He reports current alcohol use. He reports that he does not use drugs.  Allergies  Allergen Reactions   Sulfa Antibiotics Other (See Comments)    Headaches     Family History  Problem Relation Age of Onset   Hypertension Brother    Hypertension Father    Diabetes Other    Hyperlipidemia Other   Reviewed on admission  Prior to Admission medications   Medication Sig Start Date End Date Taking? Authorizing Provider  acetaminophen (TYLENOL) 500 MG tablet Take 500-1,000 mg by mouth every 6 (six) hours as needed (headaches/pain).    [provider]  albuterol (VENTOLIN HFA) 108 (90 Base) MCG/ACT inhaler Inhale 2 puffs into the lungs every 6 (six) hours.    [provider]  ALPRAZolam Duanne Moron) 1 MG tablet Take 2 mg by mouth 2 (two) times daily as needed for anxiety. 03/24/22   [provider]  aspirin EC 81 MG tablet Take 81 mg by mouth in the morning. Swallow whole.    [provider]  atorvastatin (LIPITOR) 80 MG tablet Take 1 tablet (80 mg total) by mouth daily at 6 PM. 05/31/19   Darreld Mclean, PA-C  buPROPion (WELLBUTRIN XL) 150 MG 24 hr tablet Take 150 mg by mouth in the morning and at bedtime.    [provider]  carvedilol (COREG) 3.125 MG tablet Take 3.125 mg by mouth 2 (two) times daily. 02/22/22   [provider]  clopidogrel (PLAVIX) 75 MG tablet Take 1 tablet (75 mg total) by mouth daily with  breakfast. 10/11/20   Ulyses Amor, PA-C  doxycycline (VIBRA-TABS) 100 MG tablet Take 1 tablet (100 mg total) by mouth 2 (two) times daily. 06/09/22   Suzan Slick, NP  escitalopram (LEXAPRO) 10 MG tablet Take 10 mg by mouth in the morning. 01/27/22   [provider]  fluconazole (DIFLUCAN) 100 MG tablet Take 100 mg by mouth every Monday. 03/08/22   [provider]  fluticasone (FLONASE) 50 MCG/ACT nasal spray Place 2 sprays into both nostrils daily.    [provider]  furosemide (LASIX) 40 MG tablet TAKE 1 TABLET(40 MG) BY MOUTH DAILY 05/03/22   Buford Dresser, MD  GVOKE HYPOPEN 2-PACK 1 MG/0.2ML SOAJ Inject 1 mg into the skin as needed (low blood sugar).  04/21/22   [provider]  icosapent Ethyl (VASCEPA) 1 g capsule Take 2 capsules (2 g total) by mouth 2 (two) times daily. 09/02/21   Chilton Si, MD  insulin aspart (NOVOLOG) 100 UNIT/ML FlexPen Inject 6 Units into the skin 3 (three) times daily with meals. Per sliding scale Patient taking differently: Inject 0-20 Units into the skin 3 (three) times daily with meals. Per sliding scale 11/25/21   Rhetta Mura, MD  LANTUS SOLOSTAR 100 UNIT/ML Solostar Pen Inject 50 Units into the skin in the morning and at bedtime. 04/11/22   [provider]  liraglutide (VICTOZA) 18 MG/3ML SOPN Inject 1.8 mg into the skin daily. Patient taking differently: Inject 1.2 mg into the skin every evening. 07/17/21   Sherryll Burger, Pratik D, DO  loratadine (CLARITIN) 10 MG tablet Take 10 mg by mouth daily. 12/22/21   [provider]  meclizine (ANTIVERT) 25 MG tablet Take 25-50 mg by mouth See admin instructions. Take 2 tablets (50 mg) by mouth in the morning & take 1 tablet (25 mg) by mouth at night. 02/22/22   [provider]  metFORMIN (GLUCOPHAGE) 1000 MG tablet Take 1 tablet (1,000 mg total) by mouth 2 (two) times daily with a meal. 11/05/19   Leone Brand, NP  mupirocin ointment (BACTROBAN) 2 %  Apply 1 Application topically 2 (two) times daily as needed (wound care).    [provider]  naloxone [facility]) nasal spray 4 mg/0.1 mL Place 4 mg into the nose as needed (overdose).    [provider]  nitroGLYCERIN (NITROSTAT) 0.4 MG SL tablet Place 0.4 mg under the tongue every 5 (five) minutes x 3 doses as needed for chest pain.    [provider]  oxyCODONE (ROXICODONE) 15 MG immediate release tablet Take 15 mg by mouth 5 (five) times daily. 04/01/22   [provider]  pantoprazole (PROTONIX) 40 MG tablet Take 1 tablet (40 mg total) by mouth daily. Patient taking differently: Take 80 mg by mouth daily before breakfast. 09/23/17   Hongalgi, Maximino Greenland, MD  Polyethyl Glycol-Propyl Glycol (LUBRICANT EYE DROPS) 0.4-0.3 % SOLN Place 1-2 drops into both eyes 3 (three) times daily as needed (dry/irritated allergy eyes.).    [provider]  pregabalin (LYRICA) 150 MG capsule Take 2 capsules (300 mg total) by mouth in the morning. Patient taking differently: Take 150-300 mg by mouth See admin instructions. Take 2 capsules (300 mg) by mouth in the morning & take 1 capsule (150 mg) by mouth at night. 11/25/21   Rhetta Mura, MD  promethazine (PHENERGAN) 25 MG tablet Take 25 mg by mouth every 4 (four) hours as needed for nausea or vomiting. 02/26/22   [provider]  ranolazine (RANEXA) 500 MG 12 hr tablet Take 1 tablet (500 mg total) by mouth 2 (two) times daily. 09/02/21   Chilton Si, MD    Physical Exam: Vitals:   06/30/22 1815 06/30/22 1830 06/30/22 1845 06/30/22 1900  BP: 102/63 94/68 105/73 102/69  Pulse: 74 73 74 73  Resp: (!) 9 11 13 11   Temp:      TempSrc:      SpO2: 100% 100% 100% 99%  Weight:      Height:        Physical Exam Constitutional:      General: He is not in acute distress.    Appearance: Normal appearance. He is obese.  HENT:     Head: Normocephalic and atraumatic.     Mouth/Throat:  Mouth: Mucous  membranes are moist.     Pharynx: Oropharynx is clear.  Eyes:     Extraocular Movements: Extraocular movements intact.     Pupils: Pupils are equal, round, and reactive to light.  Cardiovascular:     Rate and Rhythm: Normal rate and regular rhythm.     Pulses: Normal pulses.     Heart sounds: Normal heart sounds.  Pulmonary:     Effort: Pulmonary effort is normal. No respiratory distress.     Breath sounds: Normal breath sounds.  Abdominal:     General: Bowel sounds are normal. There is distension.     Palpations: Abdomen is soft.     Tenderness: There is no abdominal tenderness.  Musculoskeletal:        General: No swelling or deformity.     Right lower leg: Edema present.     Left lower leg: Edema present.  Skin:    General: Skin is warm and dry.  Neurological:     General: No focal deficit present.     Comments: Very drowsy.  Able to rales with voice and some shaking and able to quietly answer orientation questions appropriately.  Family reports intermittent confusion and significant increased drowsiness for several days.  No known focal deficits.    Labs on Admission: I have personally reviewed following labs and imaging studies  CBC: Recent Labs  Lab 06/30/22 1318 06/30/22 1658  WBC 10.5  --   NEUTROABS 7.7  --   HGB 10.0* 10.5*  HCT 33.8* 31.0*  MCV 80.1  --   PLT 290  --     Basic Metabolic Panel: Recent Labs  Lab 06/30/22 1318 06/30/22 1658  NA 140 143  K 6.2* 5.6*  CL 106  --   CO2 22  --   GLUCOSE 162*  --   BUN 44*  --   CREATININE 1.93*  --   CALCIUM 8.8*  --     GFR: Estimated Creatinine Clearance: 76.6 mL/min (A) (by C-G formula based on SCr of 1.93 mg/dL (H)).  Liver Function Tests: Recent Labs  Lab 06/30/22 1318  AST 24  ALT 17  ALKPHOS 107  BILITOT 0.5  PROT 6.5  ALBUMIN 2.4*    Urine analysis:    Component Value Date/Time   COLORURINE YELLOW 04/13/2022 [place] 04/13/2022 1714   LABSPEC 1.011 04/13/2022  1714   PHURINE 5.0 04/13/2022 1714   GLUCOSEU NEGATIVE 04/13/2022 1714   HGBUR NEGATIVE 04/13/2022 1714   BILIRUBINUR NEGATIVE 04/13/2022 1714   KETONESUR NEGATIVE 04/13/2022 1714   PROTEINUR 100 (A) 04/13/2022 1714   UROBILINOGEN 0.2 10/27/2007 1550   NITRITE NEGATIVE 04/13/2022 1714   LEUKOCYTESUR NEGATIVE 04/13/2022 1714    Radiological Exams on Admission: DG Foot Complete Right  Result Date: 06/30/2022 CLINICAL DATA:  Wound Shortness of breath Chest pain Right foot pain EXAM: RIGHT FOOT COMPLETE - 3+ VIEW COMPARISON:  04/14/2022 FINDINGS: Interval worsening of moderate dorsal midfoot soft tissue swelling. Large ulcer noted overlying the heel. No osseous erosions are present to definitively indicate osteomyelitis. Postsurgical changes of first toe amputation again seen. IMPRESSION: 1. Interval worsening of dorsal midfoot soft tissue swelling. 2. Large heel ulcer appears worse since prior exam. Electronically Signed   By: Miachel Roux M.D.   On: 06/30/2022 14:11   DG Chest 1 View  Result Date: 06/30/2022 CLINICAL DATA:  Shortness of breath. EXAM: CHEST  1 VIEW COMPARISON:  None Available. FINDINGS: The heart size and mediastinal  contours are within normal limits. Median sternotomy. Linear left basilar opacities. No confluent consolidation. No visible pleural effusions or pneumothorax. No acute osseous abnormality. IMPRESSION: Subsegmental left basilar atelectasis. Otherwise, no evidence of acute cardiopulmonary disease. Electronically Signed   By: Feliberto Harts M.D.   On: 06/30/2022 14:08    EKG: Independently reviewed.  Normal sinus rhythm at 92 bpm.  Nonspecific intraventricular conduction delay with QRS 102, appearance of early/incomplete left bundle branch block.  Appears similar to previous.  Assessment/Plan Active Problems:   Hyperlipidemia LDL goal <70   CAD (coronary artery disease)   History of pulmonary embolus (PE)   OSA (obstructive sleep apnea)   Anxiety disorder    COPD (chronic obstructive pulmonary disease) (HCC)   Chronic pain   GERD (gastroesophageal reflux disease)   (HFpEF) heart failure with preserved ejection fraction (HCC)   S/P CABG x 4   PAD (peripheral artery disease) (HCC)   Essential hypertension   DM (diabetes mellitus) with peripheral vascular complication (HCC)   Acute encephalopathy Hypercarbia Diabetic foot ulcer ?Osteomyelitis > Patient presenting after he was sent to the ED from urgent care when he was noted to be increasingly drowsy. > Any ED noted to have increased drowsiness and hypercarbia as below also with initial hypotension prior to IV fluids and borderline lactic acid and AKI as below. > His acute encephalopathy is primarily manifested with increased drowsiness.  Likely secondary to his hypercarbia with PCO2 of 70.2 and subsequent pH of 7.167.  This is likely multifactorial given his chronic opioid use, obesity, COPD, CHF, OSA. > Unclear if there could also be a component of infection/sepsis playing a role given his chronic foot wound, despite lack of leukocytosis does have evidence of early osteomyelitis on recent MRI.  Did receive vancomycin and cefepime in the ED. > As stated above chronic opioid use could be playing a role in hypercarbia contributing to altered mentation and could be independently playing a role along with other medications in the form of polypharmacy. - Monitor on progressive unit - Continue with BiPAP - Follow-up repeat VBG and monitor for improvement - Hold home opioids for now - Hold home Lyrica, Xanax, Lexapro, Wellbutrin - Hold off on further antibiotics - Continue IV fluids - Continue home as needed albuterol Addendum > Repeat ABG on BiPAP shows improvement in both pH and PCO2 to normal.  Patient has been made altered/drowsy despite this. > Family reports increasing migraines with worsening drowsiness and confusion, will check neuroimaging for structural abnormalities. > We will continue  with coverage for possible infection as above. - Continue on BiPAP for now - Check CT head without contrast - Continue with vancomycin and cefepime, to cover for diabetic foot infection we will also add Flagyl - Trend fever curve and WBC - Follow-up urine cultures, blood cultures, urinalysis - N.p.o. while on BiPAP  AKI on CKD Hyperkalemia > On admission noted to have potassium elevated to 6.2 and creatinine elevated to 1.93 from baseline of 1.5. > Appears to be likely volume down as he did respond to initial IV fluids.  No evidence of volume overload on chest x-ray. > Received a dose of Lokelma in the ED. - Monitor on telemetry as above - Trend potassium, repeat later this evening and tomorrow morning to monitor response to [facility] - Continue IV fluids as above - Trend renal function and electrolytes  Chronic wound > Patient with chronic wound with questionable developing osteomyelitis based on recent MRI. > Family states this wound is  stable.  Currently no leukocytosis.  Did receive antibiotics in the ED. - Continue to monitor - Trend fever curve and WBC - Initially was going to hold off on further antibiotics after initial doses, however with lack of improvement after correction of blood glass abnormalities, will cover for infection with antibiotics as above (vancomycin, cefepime, Flagyl) - Follow-up cultures  Hypercarbia COPD OSA ?OHS > Patient noted to have hypercarbia in the setting of COPD, OSA and OHS.  VBG with pH 7.167 and PCO2 70.2. - Placed on BiPAP in the ED due to the symptoms and resulting encephalopathy/drowsiness - We will continue - Continue as needed albuterol - Continue to monitor - Follow-up repeat ABG as above Addendum > ABG improved as above, will continue BiPAP overnight given history of OSA and possible OHS.  Chronic pain - In the setting of altered mentation, holding home oxycodone, Lyrica for now  Anxiety - Holding home Lexapro, Wellbutrin, Xanax  in the setting of altered mental status as above  Hyperlipidemia CAD status post CABG PAD - Continue home aspirin, Plavix, atorvastatin, carvedilol, Vascepa, ranolazine  Hypertension - Continue home carvedilol - Holding home Lasix in setting of AKI  Diastolic CHF - Last echo 0000000, grade 2 diastolic dysfunction, normal RV function - Holding home Lasix in setting of AKI as above - Trend renal function and electrolytes - We will check BMP as family reports some increased swelling despite chest x-ray being normal  Diabetes > Home dose of 50 units long-acting twice daily and SSI - Continue with 25 units long-acting twice daily - SSI  GERD - Continue home PPI  DVT prophylaxis: Lovenox Code Status:   Full Family Communication:  Wife updated by phone  Disposition Plan:   Patient is from:  Home  Anticipated DC to:  Home  Anticipated DC date:  1 to 4 days  Anticipated DC barriers: None  Consults called:  None Admission status:  Observation, progressive  Severity of Illness: The appropriate patient status for this patient is OBSERVATION. Observation status is judged to be reasonable and necessary in order to provide the required intensity of service to ensure the patient's safety. The patient's presenting symptoms, physical exam findings, and initial radiographic and laboratory data in the context of their medical condition is felt to place them at decreased risk for further clinical deterioration. Furthermore, it is anticipated that the patient will be medically stable for discharge from the hospital within 2 midnights of admission.    Marcelyn Bruins MD [facility]  How to contact the [facility] Attending or Consulting provider [place] or covering provider during after hours [place], for this patient?   Check the care team in [facility] and look for a) attending/consulting [facility] provider listed and b) the [facility] team listed Log into [email or web address] and use [place]'s universal password  to access. If you do not have the password, please contact the hospital operator. Locate the [facility] provider you are looking for under [facility] and page to a number that you can be directly reached. If you still have difficulty reaching the provider, please page the [facility] (Director on Call) for the Hospitalists listed on amion for assistance.  06/30/2022, 7:18 PM

## 2022-06-30 NOTE — Progress Notes (Signed)
Transported patient to CT and back to 026C without event.

## 2022-06-30 NOTE — Progress Notes (Deleted)
Cardiology Office Note:    Date:  06/30/2022   ID:  Joseph [name], DOB [birth year], MRN [id number]  PCP:  Joseph Panda, NP  [facility] Providers Cardiologist:  Joseph Si, MD { Click to update primary MD,subspecialty MD or APP then REFRESH:1}  *** Referring MD: Joseph Panda, NP   Chief Complaint:  No chief complaint on file. {Click here for Visit Info    :1}   Patient Profile: Coronary artery disease  S/p PCI to LCx in 07/2016 S/p CABG in 2021 Cath 07/2021: L-LAD, S-D3, S-OM2, S-RPDA patent  Peripheral arterial disease  S/p partial foot amputation S/p stent to L popliteal and L SFA S/p L BKA Jan 2023 (HFpEF) heart failure with preserved ejection fraction  Ascending thoracic aortic aneurysm  Echocardiogram 12/2021: 4.3 cm  Hypertension  Hyperlipidemia  Diabetes mellitus  Bicuspid aortic valve Hx of pulmonary embolism  Chronic Obstructive Pulmonary Disease  OSA GERD  R foot ulcer   Prior CV Studies: {Select studies to display:26339}  Cardiac catheterization 06/17/2022 LM mid to ostial LAD 80 LAD mid 50; D1 40, D3 60 LCx mid 100, stent patent with 5 ISR, distal 70 RCA proximal 80, mid stent 30 ISR, distal 50; RPDA 50 SVG-RPDA patent with 30 SVG-D3 patent SVG-distal LCx patent LIMA-LAD patent Mean PCWP 16  Chest CTA 04/13/22 No pulmonary embolism  Aortic atherosclerosis    Echocardiogram 01/07/2022 Bicuspid aortic valve, EF 55-60, no RWMA, moderate LVH, GRII DD, normal RVSF, trivial MR, no aortic stenosis, mild dilation of ascending aorta (43 mm), mild dilation of aortic root (42 mm)   Cardiac catheterization 08/17/21 L-LAD patent S-D3 patent S-OM2 patent S-RPDA patent w prox and mid 30 LVEDP 30  History of Present Illness:   Joseph [name] is a 51 y.o. male with the above problem list.  He was last seen on 06/15/22. He has had issues with sepsis and osteomyelitis this past year. In Jan 2023, he had a L BKA. He currently has an ulcer  on his R heel. At his last visit, he noted symptoms of worsening chest pain and shortness of breath which improved with NTG. He also had an episode of syncope while eating in May and is being referred to Neuro (has appt in Aug 2023 with Dr. Frances [name]). I set him up for a cardiac catheterization which demonstrated severe native CAD with 80% distal left main stenosis, totally occluded LCx, 80% proximal RCA stenosis and mild in-stent restenosis in the mid RCA stent.  LIMA-LAD, SVG-DX, SVG-OM 2 and SVG-RPDA were all patent.  Right heart pressures were normal with normal pulmonary artery pressure.  Medical therapy was recommended.    He returns for follow-up.***    Past Medical History:  Diagnosis Date   Aneurysm of ascending aorta (HCC) 09/14/2021   Anxiety    Arthritis    "knees, shoulders, hips, ankles" (07/29/2016)   Asthma    Bicuspid aortic valve 09/14/2021   CAD (coronary artery disease)    a. 2017: s/p BMS to distal Cx; b. LHC 05/30/2019: 80% mid, distal RCA s/p DES, 30% narrowing of d LM, widely patent LAD w/ luminal irregularities, widely patent stent in dCX  w/ 90+% stenosis distal to stent beofre small trifurcating obtuse marginal (potentially area of restenosis)   Chronic bronchitis (HCC)    Chronic ulcer of great toe of right foot (HCC) 09/09/2017   Chronic ulcer of right great toe (HCC) 09/19/2017   COPD (chronic obstructive pulmonary disease) (HCC)    Depression  DVT (deep venous thrombosis) (HCC)    GERD (gastroesophageal reflux disease)    Hyperlipidemia    Hypertension    Myocardial infarction (HCC) 1996   "light one"   PE (pulmonary embolism) 04/2013   On chronic Xarelto   Peripheral nerve disease    Pneumonia "several times"   Sleep apnea    Type II diabetes mellitus (HCC)    Current Medications: No outpatient medications have been marked as taking for the 06/30/22 encounter (Appointment) with Tereso Newcomer T, PA-C.    Allergies:   Sulfa antibiotics   Social History    Tobacco Use   Smoking status: Former    Packs/day: 1.00    Years: 34.00    Total pack years: 34.00    Types: Cigarettes    Quit date: 07/12/2020    Years since quitting: 1.9   Smokeless tobacco: Former    Types: Snuff, Chew  Vaping Use   Vaping Use: Never used  Substance Use Topics   Alcohol use: Yes    Comment: RARE   Drug use: No    Family Hx: The patient's family history includes Diabetes in an other family member; Hyperlipidemia in an other family member; Hypertension in his brother and father.  ROS   EKGs/Labs/Other Test Reviewed:    EKG:  EKG is *** ordered today.  The ekg ordered today demonstrates ***  Recent Labs: 04/13/2022: ALT 10; TSH 0.787 04/16/2022: Magnesium 1.7 06/15/2022: BUN 36; Creatinine, Ser 1.44; Platelets 371 06/17/2022: Hemoglobin 10.9; Potassium 4.8; Sodium 145   Recent Lipid Panel Recent Labs    08/16/21 0312 04/14/22 0331  CHOL 189  --   TRIG 316* 62  HDL 31*  --   VLDL 63*  --   LDLCALC 95  --      Risk Assessment/Calculations/Metrics:   {Does this patient have ATRIAL FIBRILLATION?:[id number]}     No BP recorded.  {Refresh Note OR Click here to enter BP  :1}***    Physical Exam:    VS:  There were no vitals taken for this visit.    Wt Readings from Last 3 Encounters:  06/17/22 (!) 350 lb (158.8 kg)  06/15/22 (!) 356 lb (161.5 kg)  04/16/22 (!) 363 lb 12.1 oz (165 kg)    Physical Exam ***     ASSESSMENT & PLAN:   No problem-specific Assessment & Plan notes found for this encounter.  *** CAD (coronary artery disease) He has a history of prior PCI and subsequent CABG in 2021.  His cardiac catheterization August 2022 demonstrated patent bypass grafts.  He has several risk factors for worsening coronary artery disease.  He has had symptoms of worsening chest pain as well as worsening shortness of breath.  He does not have significant findings on exam to suggest volume overload.  However, he does also describe symptoms that sound  consistent with CHF.  He would not be a good candidate for coronary CTA given prior bypass surgery.  We discussed the possibility proceeding with stress testing versus cardiac catheterization.  Given his worsening symptoms and risk factors, I favor proceeding with right and left heart catheterization.  I reviewed this with Dr. Mayford Knife (attending MD) who agreed. Arrange right and left cardiac catheterization Continue aspirin 81 mg daily, Lipitor 80 mg daily, Plavix 85 mg daily, Ranexa 500 mg twice daily Follow-up after cardiac catheterization   Hyperlipidemia LDL goal <70 Recent LDL 95.  Continue atorvastatin 80 mg daily, Vascepa 2 g twice daily.  Consider follow-up lipids after  cardiac catheterization and possible referral to Pharm.D. lipid clinic for consideration of PCSK9 inhibitor.   Aneurysm of ascending aorta (HCC) 43 mm on echocardiogram in January 2023.  He will ultimately need a follow-up CT scan to size his aorta.   Bicuspid aortic valve No evidence of aortic stenosis on echocardiogram in January 2023.   DM (diabetes mellitus) with peripheral vascular complication (HCC) He has had left BKA.  He has a wound on his right foot.  He may ultimately need amputation of at least part of his foot.  Recent A1c was around 7.  Continue follow-up with primary care.   Essential hypertension Blood pressure is controlled.  Continue Coreg 3.125 mg twice daily, Lasix 40 mg daily   (HFpEF) heart failure with preserved ejection fraction (HCC) He does have symptoms that sound consistent with volume excess.  However, his exam does not suggest volume overload.  Lungs are clear.  Continue Lasix 40 mg daily.  Proceed with right and left heart catheterization as noted.   Below-knee amputation of left lower extremity (HCC) He is currently working on getting a prosthetic.   Syncope and collapse The description of his syncopal episodes sound more consistent with hypoglycemia or neurogenic cause.  He has been  referred to neurology.  We can consider placing an event monitor on him when he returns for follow-up.       {Are you ordering a CV Procedure (e.g. stress test, cath, DCCV, TEE, etc)?   Press F2        :[id number]}   Dispo:  No follow-ups on file.   Medication Adjustments/Labs and Tests Ordered: Current medicines are reviewed at length with the patient today.  Concerns regarding medicines are outlined above.  Tests Ordered: No orders of the defined types were placed in this encounter.  Medication Changes: No orders of the defined types were placed in this encounter.  Signed, Tereso Newcomer, PA-C  06/30/2022 10:35 AM    [facility] [address] [place], [place], [name]  [address] Phone: [phone]; Fax: [phone]

## 2022-06-30 NOTE — Progress Notes (Signed)
Pt placed on Bipap by RT, pt tolerating well at this time. RN aware,MD aware, RT will monitor.

## 2022-06-30 NOTE — Progress Notes (Addendum)
Pharmacy Antibiotic Note  Joseph [name] is a 51 y.o. male for which pharmacy has been consulted for cefepime dosing for  osteomyelitis .  Patient with a history of diabetes, CHF, CAD AD, chronic opiate use, COPD, prior MI on Plavix, AAA. Patient presenting with generalized fatigue. Work up in the ED significant for foot XR w/ worsening of the dorsal midfoot soft tissue swelling as well as a large heel ulcer appears worse since prior exam.  SCr 1.93 - above baseline WBC 10.5; LA 2.2; T 98.7 F; HR 95; RR 15  Plan: Flagyl per MD Cefepime 2g q8hr Vancomycin 2500 mg once - f/u dosing to be determined by renal function (AKI on arrival) Trend WBC, Fever, Renal function, & Clinical course F/u cultures, clinical course, WBC, fever De-escalate when able  Height: 6\' 5"  (195.6 cm) Weight: (!) 158.8 kg (350 lb) IBW/kg (Calculated) : 89.1  Temp (24hrs), Avg:98.7 F (37.1 C), Min:98.7 F (37.1 C), Max:98.7 F (37.1 C)  Recent Labs  Lab 06/30/22 1318 06/30/22 1320  WBC 10.5  --   CREATININE 1.93*  --   LATICACIDVEN  --  2.2*    Estimated Creatinine Clearance: 76.6 mL/min (A) (by C-G formula based on SCr of 1.93 mg/dL (H)).    Allergies  Allergen Reactions   Sulfa Antibiotics Other (See Comments)    Headaches     Antimicrobials this admission: vancomycin 7/12 >>  cefepime 7/12 >>  Flagyl 7/12 >>  Microbiology results: Pending  Thank you for allowing pharmacy to be a part of this patient's care.  9/12, PharmD, BCPS 06/30/2022 4:47 PM ED Clinical Pharmacist -  [phone]

## 2022-06-30 NOTE — ED Provider Triage Note (Signed)
Emergency Medicine Provider Triage Evaluation Note  Joseph [name] , a 51 y.o. male  was evaluated in triage.  Pt complains of "not feeling good."  When asked to elaborate on this patient is unable to.  Patient states that his wife has more information however she is not present in the room.  Patient was brought to ED by EMS from urgent care due to having nausea and vomiting off.  Patient has extensive history and is on multiple narcotic pain medication.  Patient currently went to urgent care for cut on his hand and shortness of breath and chest pain over the last few weeks.  Review of Systems  Positive:  Negative:   Patient unable to give further details  Physical Exam  BP (!) 81/57   Pulse 91   Temp 98.7 F (37.1 C) (Oral)   Resp 13   Ht 6\' 5"  (1.956 m)   Wt (!) 158.8 kg   SpO2 99%   BMI 41.50 kg/m  Gen:   Awake, no distress , alert to person, place, and time. Resp:  Normal effort  MSK:   Moves extremities without difficulty  Other:  Erythema and warmth to right foot, foot is bandaged and unable to fully assess.  Medical Decision Making  Medically screening exam initiated at 12:22 PM.  Appropriate orders placed.  Joseph [name] was informed that the remainder of the evaluation will be completed by another provider, this initial triage assessment does not replace that evaluation, and the importance of remaining in the ED until their evaluation is complete.  Patient is hypotensive and had reports of lethargy.  Charge nurse was made aware and patient will be moved back to next level room.   [name], Haskel Schroeder 06/30/22 1224

## 2022-07-01 ENCOUNTER — Observation Stay (HOSPITAL_COMMUNITY): Payer: Medicare HMO

## 2022-07-01 ENCOUNTER — Telehealth: Payer: Self-pay

## 2022-07-01 DIAGNOSIS — E11628 Type 2 diabetes mellitus with other skin complications: Secondary | ICD-10-CM

## 2022-07-01 DIAGNOSIS — Y92009 Unspecified place in unspecified non-institutional (private) residence as the place of occurrence of the external cause: Secondary | ICD-10-CM

## 2022-07-01 DIAGNOSIS — E662 Morbid (severe) obesity with alveolar hypoventilation: Secondary | ICD-10-CM | POA: Diagnosis present

## 2022-07-01 DIAGNOSIS — I714 Abdominal aortic aneurysm, without rupture, unspecified: Secondary | ICD-10-CM | POA: Diagnosis present

## 2022-07-01 DIAGNOSIS — W19XXXA Unspecified fall, initial encounter: Secondary | ICD-10-CM | POA: Diagnosis present

## 2022-07-01 DIAGNOSIS — J9602 Acute respiratory failure with hypercapnia: Secondary | ICD-10-CM | POA: Diagnosis present

## 2022-07-01 DIAGNOSIS — I248 Other forms of acute ischemic heart disease: Secondary | ICD-10-CM | POA: Diagnosis present

## 2022-07-01 DIAGNOSIS — I96 Gangrene, not elsewhere classified: Secondary | ICD-10-CM | POA: Diagnosis not present

## 2022-07-01 DIAGNOSIS — I25119 Atherosclerotic heart disease of native coronary artery with unspecified angina pectoris: Secondary | ICD-10-CM | POA: Diagnosis not present

## 2022-07-01 DIAGNOSIS — D631 Anemia in chronic kidney disease: Secondary | ICD-10-CM | POA: Diagnosis present

## 2022-07-01 DIAGNOSIS — I5032 Chronic diastolic (congestive) heart failure: Secondary | ICD-10-CM | POA: Diagnosis present

## 2022-07-01 DIAGNOSIS — Z951 Presence of aortocoronary bypass graft: Secondary | ICD-10-CM

## 2022-07-01 DIAGNOSIS — E1151 Type 2 diabetes mellitus with diabetic peripheral angiopathy without gangrene: Secondary | ICD-10-CM

## 2022-07-01 DIAGNOSIS — M869 Osteomyelitis, unspecified: Secondary | ICD-10-CM | POA: Diagnosis not present

## 2022-07-01 DIAGNOSIS — N1831 Chronic kidney disease, stage 3a: Secondary | ICD-10-CM | POA: Diagnosis present

## 2022-07-01 DIAGNOSIS — I10 Essential (primary) hypertension: Secondary | ICD-10-CM

## 2022-07-01 DIAGNOSIS — L089 Local infection of the skin and subcutaneous tissue, unspecified: Secondary | ICD-10-CM

## 2022-07-01 DIAGNOSIS — I130 Hypertensive heart and chronic kidney disease with heart failure and stage 1 through stage 4 chronic kidney disease, or unspecified chronic kidney disease: Secondary | ICD-10-CM | POA: Diagnosis present

## 2022-07-01 DIAGNOSIS — G894 Chronic pain syndrome: Secondary | ICD-10-CM

## 2022-07-01 DIAGNOSIS — E1140 Type 2 diabetes mellitus with diabetic neuropathy, unspecified: Secondary | ICD-10-CM | POA: Diagnosis present

## 2022-07-01 DIAGNOSIS — K219 Gastro-esophageal reflux disease without esophagitis: Secondary | ICD-10-CM

## 2022-07-01 DIAGNOSIS — M86271 Subacute osteomyelitis, right ankle and foot: Secondary | ICD-10-CM | POA: Diagnosis not present

## 2022-07-01 DIAGNOSIS — Z6841 Body Mass Index (BMI) 40.0 and over, adult: Secondary | ICD-10-CM | POA: Diagnosis not present

## 2022-07-01 DIAGNOSIS — E1169 Type 2 diabetes mellitus with other specified complication: Secondary | ICD-10-CM | POA: Diagnosis present

## 2022-07-01 DIAGNOSIS — F32A Depression, unspecified: Secondary | ICD-10-CM | POA: Diagnosis present

## 2022-07-01 DIAGNOSIS — Q231 Congenital insufficiency of aortic valve: Secondary | ICD-10-CM | POA: Diagnosis not present

## 2022-07-01 DIAGNOSIS — L97419 Non-pressure chronic ulcer of right heel and midfoot with unspecified severity: Secondary | ICD-10-CM | POA: Diagnosis present

## 2022-07-01 DIAGNOSIS — G4733 Obstructive sleep apnea (adult) (pediatric): Secondary | ICD-10-CM

## 2022-07-01 DIAGNOSIS — F419 Anxiety disorder, unspecified: Secondary | ICD-10-CM

## 2022-07-01 DIAGNOSIS — E8729 Other acidosis: Secondary | ICD-10-CM | POA: Diagnosis present

## 2022-07-01 DIAGNOSIS — Z89512 Acquired absence of left leg below knee: Secondary | ICD-10-CM

## 2022-07-01 DIAGNOSIS — Z86711 Personal history of pulmonary embolism: Secondary | ICD-10-CM

## 2022-07-01 DIAGNOSIS — E1152 Type 2 diabetes mellitus with diabetic peripheral angiopathy with gangrene: Secondary | ICD-10-CM | POA: Diagnosis present

## 2022-07-01 DIAGNOSIS — J449 Chronic obstructive pulmonary disease, unspecified: Secondary | ICD-10-CM

## 2022-07-01 DIAGNOSIS — E1122 Type 2 diabetes mellitus with diabetic chronic kidney disease: Secondary | ICD-10-CM | POA: Diagnosis present

## 2022-07-01 DIAGNOSIS — M86171 Other acute osteomyelitis, right ankle and foot: Secondary | ICD-10-CM | POA: Diagnosis present

## 2022-07-01 DIAGNOSIS — G928 Other toxic encephalopathy: Secondary | ICD-10-CM | POA: Diagnosis present

## 2022-07-01 DIAGNOSIS — E860 Dehydration: Secondary | ICD-10-CM | POA: Diagnosis present

## 2022-07-01 DIAGNOSIS — Z794 Long term (current) use of insulin: Secondary | ICD-10-CM | POA: Diagnosis not present

## 2022-07-01 DIAGNOSIS — I739 Peripheral vascular disease, unspecified: Secondary | ICD-10-CM

## 2022-07-01 DIAGNOSIS — N179 Acute kidney failure, unspecified: Secondary | ICD-10-CM | POA: Diagnosis present

## 2022-07-01 DIAGNOSIS — G9341 Metabolic encephalopathy: Secondary | ICD-10-CM

## 2022-07-01 DIAGNOSIS — E785 Hyperlipidemia, unspecified: Secondary | ICD-10-CM

## 2022-07-01 DIAGNOSIS — I959 Hypotension, unspecified: Secondary | ICD-10-CM | POA: Diagnosis present

## 2022-07-01 DIAGNOSIS — E11621 Type 2 diabetes mellitus with foot ulcer: Secondary | ICD-10-CM | POA: Diagnosis present

## 2022-07-01 LAB — I-STAT ARTERIAL BLOOD GAS, ED
Acid-base deficit: 4 mmol/L — ABNORMAL HIGH (ref 0.0–2.0)
Bicarbonate: 22.8 mmol/L (ref 20.0–28.0)
Calcium, Ion: 1.19 mmol/L (ref 1.15–1.40)
HCT: 28 % — ABNORMAL LOW (ref 39.0–52.0)
Hemoglobin: 9.5 g/dL — ABNORMAL LOW (ref 13.0–17.0)
O2 Saturation: 95 %
Patient temperature: 98.6
Potassium: 5.8 mmol/L — ABNORMAL HIGH (ref 3.5–5.1)
Sodium: 141 mmol/L (ref 135–145)
TCO2: 24 mmol/L (ref 22–32)
pCO2 arterial: 47.5 mmHg (ref 32–48)
pH, Arterial: 7.29 — ABNORMAL LOW (ref 7.35–7.45)
pO2, Arterial: 84 mmHg (ref 83–108)

## 2022-07-01 LAB — RAPID URINE DRUG SCREEN, HOSP PERFORMED
Amphetamines: NOT DETECTED
Barbiturates: NOT DETECTED
Benzodiazepines: POSITIVE — AB
Cocaine: NOT DETECTED
Opiates: POSITIVE — AB
Tetrahydrocannabinol: NOT DETECTED

## 2022-07-01 LAB — RENAL FUNCTION PANEL
Albumin: 2.3 g/dL — ABNORMAL LOW (ref 3.5–5.0)
Anion gap: 10 (ref 5–15)
BUN: 48 mg/dL — ABNORMAL HIGH (ref 6–20)
CO2: 22 mmol/L (ref 22–32)
Calcium: 8.4 mg/dL — ABNORMAL LOW (ref 8.9–10.3)
Chloride: 106 mmol/L (ref 98–111)
Creatinine, Ser: 2.46 mg/dL — ABNORMAL HIGH (ref 0.61–1.24)
GFR, Estimated: 31 mL/min — ABNORMAL LOW (ref >60)
Glucose, Bld: 139 mg/dL — ABNORMAL HIGH (ref 70–99)
Phosphorus: 4.3 mg/dL (ref 2.5–4.6)
Potassium: 6.2 mmol/L — ABNORMAL HIGH (ref 3.5–5.1)
Sodium: 138 mmol/L (ref 135–145)

## 2022-07-01 LAB — COMPREHENSIVE METABOLIC PANEL
ALT: 14 U/L (ref 0–44)
AST: 16 U/L (ref 15–41)
Albumin: 2.1 g/dL — ABNORMAL LOW (ref 3.5–5.0)
Alkaline Phosphatase: 81 U/L (ref 38–126)
Anion gap: 8 (ref 5–15)
BUN: 47 mg/dL — ABNORMAL HIGH (ref 6–20)
CO2: 22 mmol/L (ref 22–32)
Calcium: 7.8 mg/dL — ABNORMAL LOW (ref 8.9–10.3)
Chloride: 110 mmol/L (ref 98–111)
Creatinine, Ser: 2.57 mg/dL — ABNORMAL HIGH (ref 0.61–1.24)
GFR, Estimated: 30 mL/min — ABNORMAL LOW (ref >60)
Glucose, Bld: 90 mg/dL (ref 70–99)
Potassium: 5.3 mmol/L — ABNORMAL HIGH (ref 3.5–5.1)
Sodium: 140 mmol/L (ref 135–145)
Total Bilirubin: 0.5 mg/dL (ref 0.3–1.2)
Total Protein: 5.9 g/dL — ABNORMAL LOW (ref 6.5–8.1)

## 2022-07-01 LAB — URINALYSIS, ROUTINE W REFLEX MICROSCOPIC
Bilirubin Urine: NEGATIVE
Glucose, UA: NEGATIVE mg/dL
Hgb urine dipstick: NEGATIVE
Ketones, ur: NEGATIVE mg/dL
Leukocytes,Ua: NEGATIVE
Nitrite: NEGATIVE
Protein, ur: 100 mg/dL — AB
Specific Gravity, Urine: 1.024 (ref 1.005–1.030)
pH: 5 (ref 5.0–8.0)

## 2022-07-01 LAB — SEDIMENTATION RATE: Sed Rate: 62 mm/hr — ABNORMAL HIGH (ref 0–16)

## 2022-07-01 LAB — CBG MONITORING, ED
Glucose-Capillary: 111 mg/dL — ABNORMAL HIGH (ref 70–99)
Glucose-Capillary: 88 mg/dL (ref 70–99)
Glucose-Capillary: 92 mg/dL (ref 70–99)
Glucose-Capillary: 92 mg/dL (ref 70–99)

## 2022-07-01 LAB — CBC
HCT: 31.7 % — ABNORMAL LOW (ref 39.0–52.0)
Hemoglobin: 9 g/dL — ABNORMAL LOW (ref 13.0–17.0)
MCH: 23.9 pg — ABNORMAL LOW (ref 26.0–34.0)
MCHC: 28.4 g/dL — ABNORMAL LOW (ref 30.0–36.0)
MCV: 84.1 fL (ref 80.0–100.0)
Platelets: 241 10*3/uL (ref 150–400)
RBC: 3.77 MIL/uL — ABNORMAL LOW (ref 4.22–5.81)
RDW: 18.7 % — ABNORMAL HIGH (ref 11.5–15.5)
WBC: 9.7 10*3/uL (ref 4.0–10.5)
nRBC: 0 % (ref 0.0–0.2)

## 2022-07-01 LAB — MAGNESIUM: Magnesium: 1.5 mg/dL — ABNORMAL LOW (ref 1.7–2.4)

## 2022-07-01 LAB — CK: Total CK: 367 U/L (ref 49–397)

## 2022-07-01 LAB — VITAMIN B12: Vitamin B-12: 209 pg/mL (ref 180–914)

## 2022-07-01 LAB — AMMONIA: Ammonia: 14 umol/L (ref 9–35)

## 2022-07-01 LAB — GLUCOSE, CAPILLARY
Glucose-Capillary: 156 mg/dL — ABNORMAL HIGH (ref 70–99)
Glucose-Capillary: 208 mg/dL — ABNORMAL HIGH (ref 70–99)

## 2022-07-01 LAB — C-REACTIVE PROTEIN: CRP: 12 mg/dL — ABNORMAL HIGH (ref <1.0)

## 2022-07-01 LAB — TSH: TSH: 2.506 u[IU]/mL (ref 0.350–4.500)

## 2022-07-01 MED ORDER — SODIUM ZIRCONIUM CYCLOSILICATE 10 G PO PACK
10.0000 g | PACK | Freq: Once | ORAL | Status: AC
  Administered 2022-07-01: 10 g via ORAL
  Filled 2022-07-01: qty 1

## 2022-07-01 MED ORDER — INSULIN ASPART 100 UNIT/ML IJ SOLN
0.0000 [IU] | Freq: Three times a day (TID) | INTRAMUSCULAR | Status: DC
  Administered 2022-07-01: 2 [IU] via SUBCUTANEOUS
  Administered 2022-07-02 (×2): 3 [IU] via SUBCUTANEOUS

## 2022-07-01 MED ORDER — ORAL CARE MOUTH RINSE: 15.0000 mL | OROMUCOSAL | Status: DC | PRN

## 2022-07-01 MED ORDER — PREGABALIN 50 MG PO CAPS
75.0000 mg | ORAL_CAPSULE | Freq: Two times a day (BID) | ORAL | Status: DC
  Administered 2022-07-01 – 2022-07-13 (×25): 75 mg via ORAL
  Filled 2022-07-01 (×21): qty 1
  Filled 2022-07-01: qty 3
  Filled 2022-07-01 (×4): qty 1

## 2022-07-01 MED ORDER — VANCOMYCIN HCL 1500 MG/300ML IV SOLN
1500.0000 mg | INTRAVENOUS | Status: DC
  Administered 2022-07-01 – 2022-07-07 (×7): 1500 mg via INTRAVENOUS
  Filled 2022-07-01 (×8): qty 300

## 2022-07-01 MED ORDER — ALBUTEROL SULFATE (2.5 MG/3ML) 0.083% IN NEBU
2.5000 mg | INHALATION_SOLUTION | Freq: Two times a day (BID) | RESPIRATORY_TRACT | Status: DC
  Administered 2022-07-02 – 2022-07-03 (×3): 2.5 mg via RESPIRATORY_TRACT
  Filled 2022-07-01 (×3): qty 3

## 2022-07-01 MED ORDER — ESCITALOPRAM OXALATE 10 MG PO TABS
10.0000 mg | ORAL_TABLET | Freq: Every morning | ORAL | Status: DC
  Administered 2022-07-02 – 2022-07-13 (×11): 10 mg via ORAL
  Filled 2022-07-01 (×13): qty 1

## 2022-07-01 MED ORDER — INSULIN ASPART 100 UNIT/ML IJ SOLN
0.0000 [IU] | Freq: Every day | INTRAMUSCULAR | Status: DC
  Administered 2022-07-01: 2 [IU] via SUBCUTANEOUS

## 2022-07-01 MED ORDER — BUPROPION HCL ER (XL) 150 MG PO TB24
150.0000 mg | ORAL_TABLET | Freq: Every day | ORAL | Status: DC
  Administered 2022-07-01 – 2022-07-13 (×13): 150 mg via ORAL
  Filled 2022-07-01 (×14): qty 1

## 2022-07-01 MED ORDER — CYANOCOBALAMIN 1000 MCG/ML IJ SOLN
1000.0000 ug | Freq: Once | INTRAMUSCULAR | Status: AC
Start: 2022-07-01 — End: 2022-07-01
  Administered 2022-07-01: 1000 ug via INTRAMUSCULAR
  Filled 2022-07-01: qty 1

## 2022-07-01 MED ORDER — OXYCODONE HCL 5 MG PO TABS
5.0000 mg | ORAL_TABLET | Freq: Four times a day (QID) | ORAL | Status: DC | PRN
  Administered 2022-07-01 – 2022-07-02 (×3): 5 mg via ORAL
  Filled 2022-07-01 (×3): qty 1

## 2022-07-01 MED ORDER — SODIUM CHLORIDE 0.9 % IV SOLN: INTRAVENOUS | Status: AC

## 2022-07-01 NOTE — Inpatient Diabetes Management (Signed)
Inpatient Diabetes Program Recommendations  AACE/ADA: New Consensus Statement on Inpatient Glycemic Control (2015)  Target Ranges:  Prepandial:   less than 140 mg/dL      Peak postprandial:   less than 180 mg/dL (1-2 hours)      Critically ill patients:  140 - 180 mg/dL   Lab Results  Component Value Date   GLUCAP 92 07/01/2022   HGBA1C 7.1 (H) 04/15/2022    Review of Glycemic Control  Latest Reference Range & Units 06/30/22 20:14 06/30/22 23:17 07/01/22 00:32 07/01/22 02:58 07/01/22 07:27  Glucose-Capillary 70 - 99 mg/dL 89 61 (L) 161 (H) 92 92   Diabetes history: DM 2 Outpatient Diabetes medications: Lantus 50 units bid, Victoza 1.2 mg qevening, Novolog 0-20 units tid, Metformin 1000 mg bid Current orders for Inpatient glycemic control:  Semglee 25 mg bid Novolog 0-20 units tid  A1c 7.1% on  Elevated renal function  Inpatient Diabetes Program Recommendations:    Glucose trends lower. Based on current glucose trends consider  -  Reduce Semglee to 20 units Daily (until glucose trends increase >180 consistently) -  Reduce Novolog Correction to Sensitive 0-9 units tid  Thanks,  Christena Deem RN, MSN, BC-ADM Inpatient Diabetes Coordinator Team Pager [phone] (8a-5p)

## 2022-07-01 NOTE — Progress Notes (Signed)
Patient taken off BIPAP and placed on 3 LPM [place]. Patient tolerating well. Vitals stable. RT will continue to monitor.

## 2022-07-01 NOTE — Progress Notes (Signed)
Pt admitted to 3W7 at this time.  Alert and oriented x 4. VS WNL; Telemetry placed on patient, CCMD called and second verification completed.  Call bell within reach; bed alarm set and patient understands to call for any needs.

## 2022-07-01 NOTE — Telephone Encounter (Signed)
Dr. [name] is out of office until Monday.

## 2022-07-01 NOTE — Telephone Encounter (Signed)
[facility] called stating that Dr. Alanda Slim would like a stat consult regarding patient and his ulcer.

## 2022-07-01 NOTE — ED Notes (Signed)
Lunch ordered for pt

## 2022-07-01 NOTE — Progress Notes (Signed)
Pharmacy Antibiotic Note  Joseph [name] is a 51 y.o. male for which pharmacy has been consulted for cefepime dosing for  osteomyelitis .  Patient with a history of diabetes, CHF, CAD AD, chronic opiate use, COPD, prior MI on Plavix, AAA. Patient presenting with generalized fatigue. Work up in the ED significant for foot XR w/ worsening of the dorsal midfoot soft tissue swelling as well as a large heel ulcer appears worse since prior exam.  SCr 1.93 on admission, trended up to 2.57 and leveling off (baseline ~1.5).  Plan: Flagyl 500mg  IV q12h Cefepime 2g IV q8h Vancomycin 1500mg  IV q24h. Goal AUC 400-550. Expected AUC: 463 SCr used: 2.57 Monitor clinical progress, c/s, renal function F/u de-escalation plan/LOT, vancomycin levels as indicated   Height: 6\' 5"  (195.6 cm) Weight: (!) 158.8 kg (350 lb) IBW/kg (Calculated) : 89.1  Temp (24hrs), Avg:98.7 F (37.1 C), Min:98.7 F (37.1 C), Max:98.7 F (37.1 C)  Recent Labs  Lab 06/30/22 1318 06/30/22 1320 06/30/22 1935 06/30/22 2318 07/01/22 0259  WBC 10.5  --   --   --  9.7  CREATININE 1.93*  --   --  2.54* 2.57*  LATICACIDVEN  --  2.2* 1.2  --   --      Estimated Creatinine Clearance: 57.5 mL/min (A) (by C-G formula based on SCr of 2.57 mg/dL (H)).    Allergies  Allergen Reactions   Sulfa Antibiotics Other (See Comments)    Headaches     08/31/22, PharmD, BCPS Please check AMION for all [facility] contact numbers Clinical Pharmacist 07/01/2022 12:00 PM

## 2022-07-01 NOTE — Telephone Encounter (Signed)
Checking to see who to send consult to

## 2022-07-01 NOTE — Progress Notes (Signed)
PROGRESS NOTE  Joseph [name] LPF:[id number] DOB: 1971/04/14   PCP: [name], NP  Patient is from: Home.  Lives with his wife.  Walker and wheelchair at baseline.  DOA: 06/30/2022 LOS: 0  Chief complaints Chief Complaint  Patient presents with   Fatigue     Brief Narrative / Interim history: 51 year old M with PMH of CAD/CABG/stents, diastolic CHF, PE not on AC, COPD, OSA not on CPAP or oxygen, IDDM-2, PAD, chronic right heel ulcer with early osteomyelitis, left BKA, morbid obesity, anxiety, depression and chronic pain presenting with altered mental status headache, nausea with increased lethargy, confusion, shortness of breath, edema and poor p.o. intake, and admitted for acute metabolic encephalopathy in the setting of acute respiratory failure with hypercarbia and diabetic right foot wound infection and osteomyelitis.  Reportedly, he also had a fall the morning of presentation.    In ED, he was hypotensive to 81/57.  Other vital stable.  K6.2. Cr 1.93 (baseline 1.5).  BUN 44.  Troponin 55>> 67.  Lactic acid 2.2>> 1.2.  WBC 10.5.  Hgb 10.0.  EtOH level negative.  CXR showed left basilar atelectasis.  Right foot x-ray shows increased soft tissue swelling and large heel ulcer.  ABG 7.16/70/47/25.  Cultures obtained.  Started on BiPAP, IV fluids, broad-spectrum antibiotics and Lokelma, and admitted.  Subjective: Seen and examined earlier this morning.  He was hypoglycemic to 61 last night but resolved.  He is on BiPAP.  He has no complaints other than chronic left stump pain and back pain.  He denies shortness of breath, chest pain, cough, GI or UTI symptoms.  He is asking when he could come off BiPAP and eat.  Hypercarbia resolved but pH slightly low at 7.3.  He is oriented x4 except date.  He says he is followed by Dr. Sharol Given about right foot ulcer.  He said there is a discussion about amputation.   Objective: Vitals:   07/01/22 1055 07/01/22 1125 07/01/22 1145 07/01/22 1200  BP:    (!) 111/96 133/76  Pulse: 76 77 77 81  Resp: (!) 23 15 13 13   Temp:      TempSrc:      SpO2: 97% 98% 97% 100%  Weight:      Height:        Examination:  GENERAL: No apparent distress.  Nontoxic. HEENT: MMM.  Vision and hearing grossly intact.  NECK: Supple.  Difficult to assess JVD due to body habitus. RESP: On BiPAP.  No IWOB.  Fair aeration bilaterally but limited exam due to body habitus. CVS:  RRR. Heart sounds normal.  ABD/GI/GU: BS+. Abd soft, NTND.  MSK/EXT: Left BKA with some erythema.  Foul-smelling right heel ulcer.  No apparent drainage.  Not able to feel DP pulse. S/p right big toe amputation.  SKIN: Erythema over left stump.  Right heel ulcer as above.  No surrounding skin erythema. NEURO: Awake, alert and oriented x4 except date.  No apparent focal neuro deficit. PSYCH: Calm. Normal affect.        Procedures:  None  Microbiology summarized: Blood cultures NGTD. Urine culture pending.  Assessment and plan: Principal Problem:   Acute metabolic encephalopathy Active Problems:   Hyperlipidemia LDL goal <70   CAD (coronary artery disease)   History of pulmonary embolus (PE)   OSA (obstructive sleep apnea)   Type 2 diabetes mellitus with right diabetic foot infection (HCC)   Anxiety and depression   COPD (chronic obstructive pulmonary disease) (HCC)   Obesity, Class  III, BMI 40-49.9 (morbid obesity) (HCC)   Chronic pain   Acute renal failure superimposed on stage 3a chronic kidney disease (HCC)   GERD (gastroesophageal reflux disease)   (HFpEF) heart failure with preserved ejection fraction (HCC)   S/P CABG x 4   PAD (peripheral artery disease) ([place])   Essential hypertension   DM (diabetes mellitus) with peripheral vascular complication (HCC)   Foot osteomyelitis, right (HCC)   Hx of BKA, left ([place])   Fall at home, initial encounter  Acute metabolic encephalopathy: Likely due to hypercarbia, possibly polypharmacy and severe sepsis.  Hypercarbia  resolved.  Encephalopathy resolved.  He is oriented x4 except date.  No focal neurodeficit. -Treat treatable causes -Minimize sedating medications -Reorientation and delirium precautions  Diabetic right foot ulcer infection and possible early osteomyelitis: Patient with chronic right heel ulcer.  He is followed by Dr. Sharol Given.  MRI on 7/6 with mild linear subperiosteal marrow edema about the posterior aspect of the calcaneus with adjacent ulcer concerning for osteomyelitis.  He is on doxycycline outpatient.  Per patient, Dr. Sharol Given mentioned about possible amputation.  His ABI in 12/2021 was basically normal.  He has no fever or leukocytosis.  Blood cultures NGTD. -Consulted on-call orthopedic surgeon from [facility].  Dr. Sharol Given is out of town -Continue broad-spectrum antibiotics -Check CRP and ESR  Acute respiratory failure with hypercarbia/chronic COPD/OSA/possible OHS not on CPAP after he lost his home: Had acute respiratory acidosis on presentation that seems to have improved with BiPAP.  Encephalopathy resolved as well. -BiPAP as needed and at night -Minimum oxygen to keep saturation above 90% -Minimize or avoid sedating medications -Continue home inhalers. -Follow UDS  Chronic diastolic CHF: TTE in 12/6107 with LVEF of 55 to 60%, G2 DD and no RWMA   Difficult to assess fluid status due to body habitus but does not look fluid overloaded.  On p.o. Lasix 40 mg daily -Hold diuretics in the setting of AKI -Monitor intake and output, daily weight and renal functions  Elevated troponin/history of CAD/CABG and stents/PAD: Mildly elevated troponin likely demand ischemia and delayed clearance from AKI.  Patient has no anginal symptoms.  LHC on 06/17/2022 with severe native CAD with 80% distal left main stenosis with widely patent LIMA graft supplying the LAD and SVG supplying the diagonal vessel.  At that time, cardiology recommended medical therapy with DAPT, optimal blood pressure control and  statin. -Continue coreg, statin, aspirin and Ranexa -Hold Plavix.  He has received a dose earlier this morning.   Uncontrolled IDDM-2 with hypoglycemia, CKD-3A, diabetic foot ulcer, neuropathy and PAD: A1c 7.1% on 4/27. Recent Labs  Lab 06/30/22 2317 07/01/22 0032 07/01/22 0258 07/01/22 0727 07/01/22 1126  GLUCAP 61* 111* 92 92 88  -Change SSI to sensitive -Monitor CBG -Continue statin  AKI on CKD-3A: Cr slightly worse but seems to be plateauing. This is likely prerenal from hypotension. Renal US normal.  Recent Labs    01/10/22 0302 01/11/22 0516 04/13/22 1714 04/13/22 1741 04/14/22 0331 04/15/22 6045 04/16/22 0420 06/15/22 1140 06/30/22 1318 06/30/22 2318 07/01/22 0259  BUN 21* 26* 26* 28*  --  24* 26* 36* 44* 48* 47*  CREATININE 1.18 1.10 1.42* 1.40* 1.54* 1.75* 1.53* 1.44* 1.93* 2.54* 2.57*  -Continue IV fluid -Avoid nephrotoxic meds -Renally dose medications  Anemia of chronic disease: H&H relatively stable. Recent Labs    04/15/22 0611 04/16/22 0420 06/15/22 1140 06/17/22 1006 06/17/22 1012 06/30/22 1318 06/30/22 1658 06/30/22 1938 07/01/22 0259 07/01/22 0810  HGB 11.3* 10.5*  11.1* 10.9*  10.9* 10.9* 10.0* 10.5* 9.5* 9.0* 9.5*  -Continue monitoring  Hyperkalemia: Improved to 5.3. -Lokelma 10 g x 1  Fall at home: No report of head trauma or LOC.  CT head without acute finding. Physical deconditioning/fatigue/left BKA -PT/OT consult after surgical evaluation -Fall precaution   History of PE-not on anticoagulation at home.  Anxiety/depression/chronic pain -Continue home meds for anxiety and depression -Resume pain medications at lower dose and very cautiously.  Morbid obesity Body mass index is 41.5 kg/m. -On Victoza.  Pressure skin injury/ulcer: POA -Orthopedic surgery consulted.  DVT prophylaxis:  Change Lovenox to subcu heparin  Code Status: Full code Family Communication: Updated patient's wife  Level of care:  Progressive Status is: Observation The patient will require care spanning > 2 midnights and should be moved to inpatient because: Acute metabolic encephalopathy, hypercarbic respiratory failure and diabetic right foot ulcer infection and early osteomyelitis   Final disposition: TBD Consultants:  Orthopedic surgery  Sch Meds:  Scheduled Meds:  albuterol   Inhalation Q6H   aspirin EC  81 mg Oral Daily   atorvastatin  80 mg Oral q1800   buPROPion  150 mg Oral Daily   carvedilol  3.125 mg Oral BID   cyanocobalamin  1,000 mcg Intramuscular Once   enoxaparin (LOVENOX) injection  80 mg Subcutaneous Q24H   [START ON 07/02/2022] escitalopram  10 mg Oral q AM   icosapent Ethyl  2 g Oral BID   insulin aspart  0-5 Units Subcutaneous QHS   insulin aspart  0-9 Units Subcutaneous TID WC   insulin glargine-yfgn  25 Units Subcutaneous BID   pantoprazole  40 mg Oral Daily   pregabalin  75 mg Oral BID   ranolazine  500 mg Oral BID   sodium chloride flush  3 mL Intravenous Q12H   sodium zirconium cyclosilicate  10 g Oral Once   Continuous Infusions:  sodium chloride 125 mL/hr at 07/01/22 0531   ceFEPime (MAXIPIME) IV 2 g (07/01/22 1207)   metronidazole 500 mg (07/01/22 1207)   vancomycin     PRN Meds:.acetaminophen **OR** acetaminophen, naloxone, ondansetron **OR** ondansetron (ZOFRAN) IV  Antimicrobials: Anti-infectives (From admission, onward)    Start     Dose/Rate Route Frequency Ordered Stop   07/01/22 1800  vancomycin (VANCOREADY) IVPB 1500 mg/300 mL        1,500 mg 150 mL/hr over 120 Minutes Intravenous Every 24 hours 07/01/22 1122     06/30/22 2200  doxycycline (VIBRA-TABS) tablet 100 mg  Status:  Discontinued        100 mg Oral 2 times daily 06/30/22 1924 06/30/22 1946   06/30/22 2100  metroNIDAZOLE (FLAGYL) IVPB 500 mg        500 mg 100 mL/hr over 60 Minutes Intravenous Every 12 hours 06/30/22 2034     06/30/22 2042  vancomycin variable dose per unstable renal function  (pharmacist dosing)  Status:  Discontinued         Does not apply See admin instructions 06/30/22 2042 07/01/22 1122   06/30/22 1700  vancomycin (VANCOCIN) 2,500 mg in sodium chloride 0.9 % 500 mL IVPB        2,500 mg 262.5 mL/hr over 120 Minutes Intravenous  Once 06/30/22 1645 06/30/22 2005   06/30/22 1700  ceFEPIme (MAXIPIME) 2 g in sodium chloride 0.9 % 100 mL IVPB        2 g 200 mL/hr over 30 Minutes Intravenous Every 8 hours 06/30/22 1646  I have personally reviewed the following labs and images: CBC: Recent Labs  Lab 06/30/22 1318 06/30/22 1658 06/30/22 1938 07/01/22 0259 07/01/22 0810  WBC 10.5  --   --  9.7  --   NEUTROABS 7.7  --   --   --   --   HGB 10.0* 10.5* 9.5* 9.0* 9.5*  HCT 33.8* 31.0* 28.0* 31.7* 28.0*  MCV 80.1  --   --  84.1  --   PLT 290  --   --  241  --    BMP &GFR Recent Labs  Lab 06/30/22 1318 06/30/22 1658 06/30/22 1938 06/30/22 2318 07/01/22 0259 07/01/22 0810  NA 140 143 141 141 140 141  K 6.2* 5.6* 5.9* 5.5* 5.3* 5.8*  CL 106  --   --  108 110  --   CO2 22  --   --  24 22  --   GLUCOSE 162*  --   --  67* 90  --   BUN 44*  --   --  48* 47*  --   CREATININE 1.93*  --   --  2.54* 2.57*  --   CALCIUM 8.8*  --   --  8.2* 7.8*  --    Estimated Creatinine Clearance: 57.5 mL/min (A) (by C-G formula based on SCr of 2.57 mg/dL (H)). Liver & Pancreas: Recent Labs  Lab 06/30/22 1318 07/01/22 0259  AST 24 16  ALT 17 14  ALKPHOS 107 81  BILITOT 0.5 0.5  PROT 6.5 5.9*  ALBUMIN 2.4* 2.1*   No results for input(s): "LIPASE", "AMYLASE" in the last 168 hours. Recent Labs  Lab 07/01/22 0800  AMMONIA 14   Diabetic: No results for input(s): "HGBA1C" in the last 72 hours. Recent Labs  Lab 06/30/22 2317 07/01/22 0032 07/01/22 0258 07/01/22 0727 07/01/22 1126  GLUCAP 61* 111* 92 92 88   Cardiac Enzymes: Recent Labs  Lab 07/01/22 0800  CKTOTAL 367   No results for input(s): "PROBNP" in the last 8760 hours. Coagulation  Profile: No results for input(s): "INR", "PROTIME" in the last 168 hours. Thyroid Function Tests: Recent Labs    07/01/22 0800  TSH 2.506   Lipid Profile: No results for input(s): "CHOL", "HDL", "LDLCALC", "TRIG", "CHOLHDL", "LDLDIRECT" in the last 72 hours. Anemia Panel: Recent Labs    07/01/22 0800  VITAMINB12 209   Urine analysis:    Component Value Date/Time   COLORURINE AMBER (A) 06/30/2022 1206   APPEARANCEUR HAZY (A) 06/30/2022 1206   LABSPEC 1.024 06/30/2022 1206   PHURINE 5.0 06/30/2022 1206   GLUCOSEU NEGATIVE 06/30/2022 1206   HGBUR NEGATIVE 06/30/2022 1206   BILIRUBINUR NEGATIVE 06/30/2022 1206   KETONESUR NEGATIVE 06/30/2022 1206   PROTEINUR 100 (A) 06/30/2022 1206   UROBILINOGEN 0.2 10/27/2007 1550   NITRITE NEGATIVE 06/30/2022 1206   LEUKOCYTESUR NEGATIVE 06/30/2022 1206   Sepsis Labs: Invalid input(s): "PROCALCITONIN", "LACTICIDVEN"  Microbiology: Recent Results (from the past 240 hour(s))  Blood culture (routine x 2)     Status: None (Preliminary result)   Collection Time: 06/30/22  1:18 PM   Specimen: BLOOD  Result Value Ref Range Status   Specimen Description BLOOD SITE NOT SPECIFIED  Final   Special Requests   Final    BOTTLES DRAWN AEROBIC AND ANAEROBIC Blood Culture results may not be optimal due to an inadequate volume of blood received in culture bottles   Culture   Final    NO GROWTH < 24 HOURS Performed at [facility]  Lab, [address] [place], [place] [address]    Report Status PENDING  Incomplete  Blood culture (routine x 2)     Status: None (Preliminary result)   Collection Time: 06/30/22  4:32 PM   Specimen: BLOOD  Result Value Ref Range Status   Specimen Description BLOOD RIGHT ANTECUBITAL  Final   Special Requests   Final    BOTTLES DRAWN AEROBIC AND ANAEROBIC Blood Culture results may not be optimal due to an excessive volume of blood received in culture bottles   Culture   Final    NO GROWTH < 24 HOURS Performed at  [facility], [place] [address], [place], [place] [address]    Report Status PENDING  Incomplete    Radiology Studies: US RENAL  Result Date: 07/01/2022 CLINICAL DATA:  Acute kidney injury. EXAM: RENAL / URINARY TRACT ULTRASOUND COMPLETE COMPARISON:  CT angio chest 04/13/2022 and abdominal ultrasound 09/19/2017 FINDINGS: Right Kidney: Renal measurements: 12.8 x 6.0 x 6.1 centimeters = volume: 244.1 mL. Echogenicity within normal limits. No mass or hydronephrosis visualized. Left Kidney: Renal measurements: 11.2 x 6.1 x 6.5 centimeters = volume: 231.4 mL. Echogenicity within normal limits. No mass or hydronephrosis visualized. Of note, the LOWER pole region it is difficult to visualized secondary to overlying bowel gas. Bladder: Appears normal for degree of bladder distention. Other: None. IMPRESSION: Normal exam. Electronically Signed   By: Nolon Nations M.D.   On: 07/01/2022 09:16   CT HEAD WO CONTRAST (5MM)  Result Date: 06/30/2022 CLINICAL DATA:  Mental status change EXAM: CT HEAD WITHOUT CONTRAST TECHNIQUE: Contiguous axial images were obtained from the base of the skull through the vertex without intravenous contrast. RADIATION DOSE REDUCTION: This exam was performed according to the departmental dose-optimization program which includes automated exposure control, adjustment of the mA and/or kV according to patient size and/or use of iterative reconstruction technique. COMPARISON:  MRI 04/14/2022, CT brain 04/13/2022 FINDINGS: Brain: No acute territorial infarction, hemorrhage or intracranial mass. The ventricles are nonenlarged. Vascular: No hyperdense vessels.  Carotid vascular calcification Skull: Normal. Negative for fracture or focal lesion. Sinuses/Orbits: No acute finding. Mucous retention cysts in the maxillary sinuses Other: None IMPRESSION: Negative.  No CT evidence for acute intracranial abnormality Electronically Signed   By: Donavan Foil M.D.   On: 06/30/2022 22:04   DG Foot  Complete Right  Result Date: 06/30/2022 CLINICAL DATA:  Wound Shortness of breath Chest pain Right foot pain EXAM: RIGHT FOOT COMPLETE - 3+ VIEW COMPARISON:  04/14/2022 FINDINGS: Interval worsening of moderate dorsal midfoot soft tissue swelling. Large ulcer noted overlying the heel. No osseous erosions are present to definitively indicate osteomyelitis. Postsurgical changes of first toe amputation again seen. IMPRESSION: 1. Interval worsening of dorsal midfoot soft tissue swelling. 2. Large heel ulcer appears worse since prior exam. Electronically Signed   By: Miachel Roux M.D.   On: 06/30/2022 14:11   DG Chest 1 View  Result Date: 06/30/2022 CLINICAL DATA:  Shortness of breath. EXAM: CHEST  1 VIEW COMPARISON:  None Available. FINDINGS: The heart size and mediastinal contours are within normal limits. Median sternotomy. Linear left basilar opacities. No confluent consolidation. No visible pleural effusions or pneumothorax. No acute osseous abnormality. IMPRESSION: Subsegmental left basilar atelectasis. Otherwise, no evidence of acute cardiopulmonary disease. Electronically Signed   By: Margaretha Sheffield M.D.   On: 06/30/2022 14:08      Dejay Kronk T. Harwick  If 7PM-7AM, please contact night-coverage [email or web address] 07/01/2022, 1:42 PM

## 2022-07-01 NOTE — Telephone Encounter (Signed)
Texted Franky Macho, [place] about consult.

## 2022-07-02 DIAGNOSIS — T796XXA Traumatic ischemia of muscle, initial encounter: Secondary | ICD-10-CM

## 2022-07-02 DIAGNOSIS — G9341 Metabolic encephalopathy: Secondary | ICD-10-CM | POA: Diagnosis not present

## 2022-07-02 DIAGNOSIS — E11628 Type 2 diabetes mellitus with other skin complications: Secondary | ICD-10-CM | POA: Diagnosis not present

## 2022-07-02 DIAGNOSIS — Z951 Presence of aortocoronary bypass graft: Secondary | ICD-10-CM | POA: Diagnosis not present

## 2022-07-02 DIAGNOSIS — I5032 Chronic diastolic (congestive) heart failure: Secondary | ICD-10-CM | POA: Diagnosis not present

## 2022-07-02 LAB — CBC
HCT: 28.4 % — ABNORMAL LOW (ref 39.0–52.0)
Hemoglobin: 8.6 g/dL — ABNORMAL LOW (ref 13.0–17.0)
MCH: 24.2 pg — ABNORMAL LOW (ref 26.0–34.0)
MCHC: 30.3 g/dL (ref 30.0–36.0)
MCV: 79.8 fL — ABNORMAL LOW (ref 80.0–100.0)
Platelets: 237 10*3/uL (ref 150–400)
RBC: 3.56 MIL/uL — ABNORMAL LOW (ref 4.22–5.81)
RDW: 18.4 % — ABNORMAL HIGH (ref 11.5–15.5)
WBC: 7.5 10*3/uL (ref 4.0–10.5)
nRBC: 0 % (ref 0.0–0.2)

## 2022-07-02 LAB — URINE CULTURE: Culture: <10000 — AB

## 2022-07-02 LAB — RENAL FUNCTION PANEL
Albumin: 2 g/dL — ABNORMAL LOW (ref 3.5–5.0)
Anion gap: 11 (ref 5–15)
BUN: 50 mg/dL — ABNORMAL HIGH (ref 6–20)
CO2: 22 mmol/L (ref 22–32)
Calcium: 7.9 mg/dL — ABNORMAL LOW (ref 8.9–10.3)
Chloride: 104 mmol/L (ref 98–111)
Creatinine, Ser: 2.22 mg/dL — ABNORMAL HIGH (ref 0.61–1.24)
GFR, Estimated: 35 mL/min — ABNORMAL LOW (ref >60)
Glucose, Bld: 231 mg/dL — ABNORMAL HIGH (ref 70–99)
Phosphorus: 3.5 mg/dL (ref 2.5–4.6)
Potassium: 5.3 mmol/L — ABNORMAL HIGH (ref 3.5–5.1)
Sodium: 137 mmol/L (ref 135–145)

## 2022-07-02 LAB — GLUCOSE, CAPILLARY
Glucose-Capillary: 209 mg/dL — ABNORMAL HIGH (ref 70–99)
Glucose-Capillary: 206 mg/dL — ABNORMAL HIGH (ref 70–99)
Glucose-Capillary: 217 mg/dL — ABNORMAL HIGH (ref 70–99)
Glucose-Capillary: 149 mg/dL — ABNORMAL HIGH (ref 70–99)
Glucose-Capillary: 181 mg/dL — ABNORMAL HIGH (ref 70–99)

## 2022-07-02 LAB — BRAIN NATRIURETIC PEPTIDE: B Natriuretic Peptide: 133.1 pg/mL — ABNORMAL HIGH (ref 0.0–100.0)

## 2022-07-02 LAB — VANCOMYCIN, PEAK: Vancomycin Pk: 39 ug/mL (ref 30–40)

## 2022-07-02 LAB — MAGNESIUM: Magnesium: 1.3 mg/dL — ABNORMAL LOW (ref 1.7–2.4)

## 2022-07-02 LAB — CK: Total CK: 2412 U/L — ABNORMAL HIGH (ref 49–397)

## 2022-07-02 MED ORDER — SENNA 8.6 MG PO TABS
2.0000 | ORAL_TABLET | Freq: Every day | ORAL | Status: DC
  Administered 2022-07-04 – 2022-07-13 (×6): 17.2 mg via ORAL
  Filled 2022-07-02 (×11): qty 2

## 2022-07-02 MED ORDER — OXYCODONE HCL 5 MG PO TABS
5.0000 mg | ORAL_TABLET | Freq: Four times a day (QID) | ORAL | Status: DC | PRN
  Administered 2022-07-02 – 2022-07-05 (×9): 10 mg via ORAL
  Administered 2022-07-06: 5 mg via ORAL
  Administered 2022-07-06 (×2): 10 mg via ORAL
  Filled 2022-07-02 (×12): qty 2

## 2022-07-02 MED ORDER — INSULIN ASPART 100 UNIT/ML IJ SOLN
0.0000 [IU] | Freq: Every day | INTRAMUSCULAR | Status: DC
  Administered 2022-07-07: 2 [IU] via SUBCUTANEOUS

## 2022-07-02 MED ORDER — SODIUM ZIRCONIUM CYCLOSILICATE 10 G PO PACK
10.0000 g | PACK | Freq: Once | ORAL | Status: AC
Start: 2022-07-02 — End: 2022-07-02
  Administered 2022-07-02: 10 g via ORAL
  Filled 2022-07-02: qty 1

## 2022-07-02 MED ORDER — INSULIN ASPART 100 UNIT/ML IJ SOLN
3.0000 [IU] | Freq: Three times a day (TID) | INTRAMUSCULAR | Status: DC
Start: 2022-07-02 — End: 2022-07-14
  Administered 2022-07-02 – 2022-07-13 (×29): 3 [IU] via SUBCUTANEOUS

## 2022-07-02 MED ORDER — INSULIN ASPART 100 UNIT/ML IJ SOLN
0.0000 [IU] | Freq: Three times a day (TID) | INTRAMUSCULAR | Status: DC
  Administered 2022-07-02: 2 [IU] via SUBCUTANEOUS
  Administered 2022-07-03: 5 [IU] via SUBCUTANEOUS
  Administered 2022-07-03 – 2022-07-05 (×5): 2 [IU] via SUBCUTANEOUS
  Administered 2022-07-05: 3 [IU] via SUBCUTANEOUS
  Administered 2022-07-06 (×2): 2 [IU] via SUBCUTANEOUS
  Administered 2022-07-06 – 2022-07-07 (×2): 3 [IU] via SUBCUTANEOUS
  Administered 2022-07-07: 5 [IU] via SUBCUTANEOUS
  Administered 2022-07-08 (×2): 2 [IU] via SUBCUTANEOUS
  Administered 2022-07-08: 5 [IU] via SUBCUTANEOUS
  Administered 2022-07-09: 2 [IU] via SUBCUTANEOUS
  Administered 2022-07-09: 3 [IU] via SUBCUTANEOUS
  Administered 2022-07-09: 5 [IU] via SUBCUTANEOUS
  Administered 2022-07-10: 2 [IU] via SUBCUTANEOUS
  Administered 2022-07-10: 3 [IU] via SUBCUTANEOUS
  Administered 2022-07-10: 5 [IU] via SUBCUTANEOUS
  Administered 2022-07-11 – 2022-07-13 (×7): 3 [IU] via SUBCUTANEOUS
  Administered 2022-07-13: 2 [IU] via SUBCUTANEOUS
  Administered 2022-07-13: 3 [IU] via SUBCUTANEOUS

## 2022-07-02 MED ORDER — MAGNESIUM SULFATE 2 GM/50ML IV SOLN
2.0000 g | Freq: Once | INTRAVENOUS | Status: AC
  Administered 2022-07-02: 2 g via INTRAVENOUS
  Filled 2022-07-02: qty 50

## 2022-07-02 NOTE — Progress Notes (Signed)
Pt refused BiPAP for tonight. Will call if he needs it. 

## 2022-07-02 NOTE — Consult Note (Signed)
ORTHOPAEDIC CONSULTATION  REQUESTING PHYSICIAN: Almon Hercules, MD  Chief Complaint: "My right foot hurts"  HPI: Joseph [name] is a 51 y.o. male who presents with right foot pain.  Came to the emergency department at [facility] on 06/30/2022 after being taken by EMS from a clinic appointment due to altered mental status.  Admitted to family medicine who has consulted orthopedics for evaluation of right heel wound.  He states that he has had right heel wound ongoing and follows with Dr. [name].  He recently saw [name], NP on 06/09/2022 who ordered MRI of the right foot that demonstrated early osteomyelitis of the right calcaneus on MRI scan from 06/24/2022.  He reports that though he has reduced sensation and neuropathy in his right foot, he still has significant pain when he tries to weight-bear on the right lower extremity.  Wound has been present for several months.  Denies any fevers, chills, night sweats.  Overall feels well aside from occasional nausea.  He does have history of left below the knee amputation by Dr. [name] on 01/08/2022.  He is ambulatory with this extremity and uses a prosthesis at times.  Also has history of right great toe amputation by Dr. [name] in October 2018  Past Medical History:  Diagnosis Date   Aneurysm of ascending aorta (HCC) 09/14/2021   Anxiety    Arthritis    "knees, shoulders, hips, ankles" (07/29/2016)   Asthma    Bicuspid aortic valve 09/14/2021   CAD (coronary artery disease)    a. 2017: s/p BMS to distal Cx; b. LHC 05/30/2019: 80% mid, distal RCA s/p DES, 30% narrowing of d LM, widely patent LAD w/ luminal irregularities, widely patent stent in dCX  w/ 90+% stenosis distal to stent beofre small trifurcating obtuse marginal (potentially area of restenosis)   Chronic bronchitis (HCC)    Chronic ulcer of great toe of right foot (HCC) 09/09/2017   Chronic ulcer of right great toe (HCC) 09/19/2017   COPD (chronic obstructive pulmonary disease) (HCC)    Depression     DVT (deep venous thrombosis) (HCC)    GERD (gastroesophageal reflux disease)    Hyperlipidemia    Hypertension    Myocardial infarction (HCC) 1996   "light one"   PE (pulmonary embolism) 04/2013   On chronic Xarelto   Peripheral nerve disease    Pneumonia "several times"   Sleep apnea    Type II diabetes mellitus (HCC)    Past Surgical History:  Procedure Laterality Date   ABDOMINAL AORTOGRAM W/LOWER EXTREMITY N/A 10/10/2020   Procedure: ABDOMINAL AORTOGRAM W/LOWER EXTREMITY;  Surgeon: Sherren Kerns, MD;  [facility]: [facility] IN[facility] LAB;  Service: Cardiovascular;  Laterality: N/A;   AMPUTATION Right 09/21/2017   Procedure: RIGHT GREAT TOE AMPUTATION, POSSIBLE VAC;  Surgeon: Tarry Kos, MD;  Location: [facility] OR;  Service: Orthopedics;  Laterality: Right;   AMPUTATION Left 08/13/2020   Procedure: LEFT FOOT 4TH RAY AMPUTATION;  Surgeon: [name], MD;  Location: [facility] OR;  Service: Orthopedics;  Laterality: Left;   AMPUTATION Left 11/20/2021   Procedure: LEFT TRANSMETATARSAL AMPUTATION;  Surgeon: [name], MD;  Location: [facility] OR;  Service: Orthopedics;  Laterality: Left;   AMPUTATION Left 01/08/2022   Procedure: LEFT BELOW KNEE AMPUTATION;  Surgeon: [name], MD;  Location: [facility] OR;  Service: Orthopedics;  Laterality: Left;   AORTOGRAM Bilateral 03/13/2021   Procedure: ABDOMINAL AORTOGRAM WITH Left LOWER EXTREMITY RUNOFF;  Surgeon: Leonie Douglas, MD;  Location:  [facility] OR;  Service: Vascular;  Laterality: Bilateral;   APPLICATION OF WOUND VAC  01/08/2022   Procedure: APPLICATION OF WOUND VAC;  Surgeon: [name], MD;  [facility]: [facility] OR;  Service: Orthopedics;;   CARDIAC CATHETERIZATION  2006   CARDIAC CATHETERIZATION  1996   "@ [facility]; when I had my heart attack"   CARDIAC CATHETERIZATION N/A 07/29/2016   Procedure: Left Heart Cath and Coronary Angiography;  Surgeon: Runell Gess, MD;  Location: [facility] INVASIVE CV LAB;  Service: Cardiovascular;  Laterality: N/A;   CARDIAC  CATHETERIZATION N/A 07/29/2016   Procedure: Coronary Stent Intervention;  Surgeon: Runell Gess, MD;  [facility]: [facility];  Service: Cardiovascular;  Laterality: N/A;   CARPAL TUNNEL RELEASE Bilateral    CORONARY ANGIOPLASTY WITH STENT PLACEMENT  07/29/2016   CORONARY ARTERY BYPASS GRAFT N/A 07/24/2020   Procedure: CORONARY ARTERY BYPASS GRAFTING (CABG), ON PUMP, TIMES FOUR, USING LEFT INTERNAL MAMMARY ARTERY AND ENDOSCOPICALLY HARVESTED RIGHT GREATER SAPHENOUS VEIN;  Surgeon: Corliss Skains, MD;  Location: [facility] OR;  Service: Open Heart Surgery;  Laterality: N/A;  FLOW TAC   CORONARY STENT INTERVENTION N/A 05/30/2019   Procedure: CORONARY STENT INTERVENTION;  Surgeon: Lyn Records, MD;  [facility];  Service: Cardiovascular;  Laterality: N/A;   CORONARY STENT INTERVENTION N/A 11/02/2019   Procedure: CORONARY STENT INTERVENTION;  Surgeon: Iran Ouch, MD;  [facility];  Service: Cardiovascular;  Laterality: N/A;   ESOPHAGOGASTRODUODENOSCOPY N/A 09/22/2017   Procedure: ESOPHAGOGASTRODUODENOSCOPY (EGD);  Surgeon: Meryl Dare, MD;  Location: [facility] ENDOSCOPY;  Service: Endoscopy;  Laterality: N/A;   INTRAVASCULAR PRESSURE WIRE/FFR STUDY N/A 07/22/2020   Procedure: INTRAVASCULAR PRESSURE WIRE/FFR STUDY;  Surgeon: Marykay Lex, MD;  Location: [facility] INVASIVE CV LAB;  Service: Cardiovascular;  Laterality: N/A;   KNEE ARTHROSCOPY Bilateral    "2 on left; 1 on the right"   LEFT HEART CATH AND CORONARY ANGIOGRAPHY N/A 11/02/2019   Procedure: LEFT HEART CATH AND CORONARY ANGIOGRAPHY;  Surgeon: Iran Ouch, MD;  [facility];  Service: Cardiovascular;  Laterality: N/A;   LEFT HEART CATH AND CORONARY ANGIOGRAPHY N/A 07/22/2020   Procedure: LEFT HEART CATH AND CORONARY ANGIOGRAPHY;  Surgeon: Marykay Lex, MD;  Location: [facility] INVASIVE CV LAB;  Service: Cardiovascular;  Laterality: N/A;   LEFT HEART CATH AND CORS/GRAFTS ANGIOGRAPHY N/A  08/17/2021   Procedure: LEFT HEART CATH AND CORS/GRAFTS ANGIOGRAPHY;  Surgeon: Yvonne Kendall, MD;  [facility];  Service: Cardiovascular;  Laterality: N/A;   PERIPHERAL VASCULAR INTERVENTION Left 10/11/2020   popliteal and SFA stent placement    PERIPHERAL VASCULAR INTERVENTION Left 10/10/2020   Procedure: PERIPHERAL VASCULAR INTERVENTION;  Surgeon: Sherren Kerns, MD;  [facility];  Service: Cardiovascular;  Laterality: Left;   RIGHT/LEFT HEART CATH AND CORONARY ANGIOGRAPHY N/A 05/30/2019   Procedure: RIGHT/LEFT HEART CATH AND CORONARY ANGIOGRAPHY;  Surgeon: Lyn Records, MD;  [facility];  Service: Cardiovascular;  Laterality: N/A;   RIGHT/LEFT HEART CATH AND CORONARY/GRAFT ANGIOGRAPHY N/A 06/17/2022   Procedure: RIGHT/LEFT HEART CATH AND CORONARY/GRAFT ANGIOGRAPHY;  Surgeon: Lennette Bihari, MD;  [facility];  Service: Cardiovascular;  Laterality: N/A;   SHOULDER OPEN ROTATOR CUFF REPAIR Bilateral    TEE WITHOUT CARDIOVERSION N/A 07/24/2020   Procedure: TRANSESOPHAGEAL ECHOCARDIOGRAM (TEE);  Surgeon: Corliss Skains, MD;  Location: [facility] OR;  Service: Open Heart Surgery;  Laterality: N/A;  Social History   Socioeconomic History   Marital status: Married    Spouse name: Not on file   Number of children: Not on file   Years of education: Not on file   Highest education level: Not on file  Occupational History   Occupation: disabled     Employer: UNEMPLOYED  Tobacco Use   Smoking status: Former    Packs/day: 1.00    Years: 34.00    Total pack years: 34.00    Types: Cigarettes    Quit date: 07/12/2020    Years since quitting: 1.9   Smokeless tobacco: Former    Types: Snuff, Chew  Vaping Use   Vaping Use: Never used  Substance and Sexual Activity   Alcohol use: Yes    Comment: RARE   Drug use: No   Sexual activity: Yes  Other Topics Concern   Not on file  Social History Narrative   Not on file    Social Determinants of Health   Financial Resource Strain: Not on file  Food Insecurity: Not on file  Transportation Needs: Not on file  Physical Activity: Not on file  Stress: Not on file  Social Connections: Not on file   Family History  Problem Relation Age of Onset   Hypertension Brother    Hypertension Father    Diabetes Other    Hyperlipidemia Other    - negative except otherwise stated in the family history section Allergies  Allergen Reactions   Sulfa Antibiotics Anaphylaxis and Other (See Comments)    Headaches    Prior to Admission medications   Medication Sig Start Date End Date Taking? Authorizing Provider  acetaminophen (TYLENOL) 500 MG tablet Take 1,000 mg by mouth 3 (three) times daily as needed for headache.   Yes [provider]  albuterol (VENTOLIN HFA) 108 (90 Base) MCG/ACT inhaler Inhale 2 puffs into the lungs every 6 (six) hours.   Yes [provider]  ALPRAZolam Prudy Feeler) 1 MG tablet Take 2 mg by mouth at bedtime. 03/24/22  Yes [provider]  aspirin EC 81 MG tablet Take 81 mg by mouth in the morning. Swallow whole.   Yes [provider]  atorvastatin (LIPITOR) 80 MG tablet Take 1 tablet (80 mg total) by mouth daily at 6 PM. 05/31/19  Yes Marjie Skiff E, PA-C  buPROPion (WELLBUTRIN XL) 150 MG 24 hr tablet Take 150 mg by mouth in the morning and at bedtime.   Yes [provider]  calcium carbonate (TUMS EXTRA STRENGTH 750) 750 MG chewable tablet Chew 1,500 mg by mouth as needed (stomach pain).   Yes [provider]  carvedilol (COREG) 3.125 MG tablet Take 3.125 mg by mouth 2 (two) times daily. 02/22/22  Yes [provider]  clopidogrel (PLAVIX) 75 MG tablet Take 1 tablet (75 mg total) by mouth daily with breakfast. 10/11/20  Yes Lars Mage, PA-C  doxycycline (VIBRA-TABS) 100 MG tablet Take 1 tablet (100 mg total) by mouth 2 (two) times daily. 06/09/22  Yes [name] R, NP  escitalopram  (LEXAPRO) 10 MG tablet Take 10 mg by mouth in the morning. 01/27/22  Yes [provider]  fluconazole (DIFLUCAN) 100 MG tablet Take 100 mg by mouth every Monday. 03/08/22  Yes [provider]  fluticasone (FLONASE) 50 MCG/ACT nasal spray Place 2 sprays into both nostrils daily.   Yes [provider]  furosemide (LASIX) 40 MG tablet TAKE 1 TABLET(40 MG) BY MOUTH DAILY Patient taking differently: Take 40 mg  by mouth daily. 05/03/22  Yes Jodelle Red, MD  GVOKE HYPOPEN 2-PACK 1 MG/0.2ML SOAJ Inject 1 mg into the skin as needed (low blood sugar). 04/21/22  Yes [provider]  icosapent Ethyl (VASCEPA) 1 g capsule Take 2 capsules (2 g total) by mouth 2 (two) times daily. 09/02/21  Yes Chilton Si, MD  insulin aspart (NOVOLOG) 100 UNIT/ML FlexPen Inject 6 Units into the skin 3 (three) times daily with meals. Per sliding scale Patient taking differently: Inject 2-36 Units into the skin 3 (three) times daily with meals. 11/25/21  Yes Rhetta Mura, MD  LANTUS SOLOSTAR 100 UNIT/ML Solostar Pen Inject 50 Units into the skin in the morning and at bedtime. 04/11/22  Yes [provider]  liraglutide (VICTOZA) 18 MG/3ML SOPN Inject 1.8 mg into the skin daily. Patient taking differently: Inject 1.2 mg into the skin every evening. 07/17/21  Yes Shah, Pratik D, DO  loratadine (CLARITIN) 10 MG tablet Take 10 mg by mouth daily. 12/22/21  Yes [provider]  meclizine (ANTIVERT) 25 MG tablet Take 25-50 mg by mouth See admin instructions. Take 50 mg  by mouth in the morning and take 25 mg  by mouth at night. 02/22/22  Yes [provider]  metFORMIN (GLUCOPHAGE) 1000 MG tablet Take 1 tablet (1,000 mg total) by mouth 2 (two) times daily with a meal. 11/05/19  Yes Leone Brand, NP  naloxone [facility]) nasal spray 4 mg/0.1 mL Place 4 mg into the nose as needed (overdose).   Yes [provider]  nitroGLYCERIN (NITROSTAT) 0.4 MG SL tablet Place  0.4 mg under the tongue every 5 (five) minutes x 3 doses as needed for chest pain.   Yes [provider]  oxyCODONE (ROXICODONE) 15 MG immediate release tablet Take 15 mg by mouth 5 (five) times daily. 04/01/22  Yes [provider]  pantoprazole (PROTONIX) 40 MG tablet Take 1 tablet (40 mg total) by mouth daily. Patient taking differently: Take 80 mg by mouth daily before breakfast. 09/23/17  Yes Hongalgi, Maximino Greenland, MD  pregabalin (LYRICA) 150 MG capsule Take 2 capsules (300 mg total) by mouth in the morning. Patient taking differently: Take 150-300 mg by mouth See admin instructions. Take 300 mg by mouth in the morning and take 150 mg  by mouth at night. 11/25/21  Yes Rhetta Mura, MD  promethazine (PHENERGAN) 25 MG tablet Take 25 mg by mouth every 4 (four) hours as needed for nausea or vomiting. 02/26/22  Yes [provider]  ranolazine (RANEXA) 500 MG 12 hr tablet Take 1 tablet (500 mg total) by mouth 2 (two) times daily. 09/02/21  Yes Chilton Si, MD  mupirocin ointment (BACTROBAN) 2 % Apply 1 Application topically 2 (two) times daily as needed (wound care). Patient not taking: Reported on 07/01/2022    [provider]   US RENAL  Result Date: 07/01/2022 CLINICAL DATA:  Acute kidney injury. EXAM: RENAL / URINARY TRACT ULTRASOUND COMPLETE COMPARISON:  CT angio chest 04/13/2022 and abdominal ultrasound 09/19/2017 FINDINGS: Right Kidney: Renal measurements: 12.8 x 6.0 x 6.1 centimeters = volume: 244.1 mL. Echogenicity within normal limits. No mass or hydronephrosis visualized. Left Kidney: Renal measurements: 11.2 x 6.1 x 6.5 centimeters = volume: 231.4 mL. Echogenicity within normal limits. No mass or hydronephrosis visualized. Of note, the LOWER pole region it is difficult to visualized secondary to overlying bowel gas. Bladder: Appears normal for degree of bladder distention. Other: None. IMPRESSION: Normal exam. Electronically Signed   By: Lanora Manis  Manson Passey  M.D.   On: 07/01/2022 09:16   CT HEAD WO CONTRAST ( )  Result Date: 06/30/2022 CLINICAL DATA:  Mental status change EXAM: CT HEAD WITHOUT CONTRAST TECHNIQUE: Contiguous axial images were obtained from the base of the skull through the vertex without intravenous contrast. RADIATION DOSE REDUCTION: This exam was performed according to the departmental dose-optimization program which includes automated exposure control, adjustment of the mA and/or kV according to patient size and/or use of iterative reconstruction technique. COMPARISON:  MRI 04/14/2022, CT brain 04/13/2022 FINDINGS: Brain: No acute territorial infarction, hemorrhage or intracranial mass. The ventricles are nonenlarged. Vascular: No hyperdense vessels.  Carotid vascular calcification Skull: Normal. Negative for fracture or focal lesion. Sinuses/Orbits: No acute finding. Mucous retention cysts in the maxillary sinuses Other: None IMPRESSION: Negative.  No CT evidence for acute intracranial abnormality Electronically Signed   By: Jasmine Pang M.D.   On: 06/30/2022 22:04   DG Foot Complete Right  Result Date: 06/30/2022 CLINICAL DATA:  Wound Shortness of breath Chest pain Right foot pain EXAM: RIGHT FOOT COMPLETE - 3+ VIEW COMPARISON:  04/14/2022 FINDINGS: Interval worsening of moderate dorsal midfoot soft tissue swelling. Large ulcer noted overlying the heel. No osseous erosions are present to definitively indicate osteomyelitis. Postsurgical changes of first toe amputation again seen. IMPRESSION: 1. Interval worsening of dorsal midfoot soft tissue swelling. 2. Large heel ulcer appears worse since prior exam. Electronically Signed   By: Acquanetta Belling M.D.   On: 06/30/2022 14:11   DG Chest 1 View  Result Date: 06/30/2022 CLINICAL DATA:  Shortness of breath. EXAM: CHEST  1 VIEW COMPARISON:  None Available. FINDINGS: The heart size and mediastinal contours are within normal limits. Median sternotomy. Linear left basilar opacities. No confluent  consolidation. No visible pleural effusions or pneumothorax. No acute osseous abnormality. IMPRESSION: Subsegmental left basilar atelectasis. Otherwise, no evidence of acute cardiopulmonary disease. Electronically Signed   By: Feliberto Harts M.D.   On: 06/30/2022 14:08   - pertinent xrays, CT, MRI studies were reviewed and independently interpreted  Positive ROS: All other systems have been reviewed and were otherwise negative with the exception of those mentioned in the HPI and as above.  Physical Exam: General: Alert, no acute distress Psychiatric: Patient is competent for consent with normal mood and affect Lymphatic: No axillary or cervical lymphadenopathy Cardiovascular: No pedal edema Respiratory: No cyanosis, no use of accessory musculature GI: No organomegaly, abdomen is soft and non-tender    Images:  @ENCIMAGES @  Labs:  Lab Results  Component Value Date   HGBA1C 7.1 (H) 04/15/2022   HGBA1C 8.4 (H) 01/07/2022   HGBA1C 10.0 (H) 11/18/2021   ESRSEDRATE 62 (H) 07/01/2022   ESRSEDRATE 45 (H) 11/18/2021   ESRSEDRATE 41 (H) 09/16/2017   CRP 12.0 (H) 07/01/2022   CRP 12.1 (H) 11/18/2021   CRP 8.9 (H) 09/16/2017   REPTSTATUS 07/02/2022 FINAL 07/01/2022   CULT (A) 07/01/2022    <10,000 COLONIES/mL INSIGNIFICANT GROWTH Performed at [facility], [address], [place], [name]    [address] ENTEROCOCCUS FAECALIS (A) 01/06/2022    Lab Results  Component Value Date   ALBUMIN 2.0 (L) 07/02/2022   ALBUMIN 2.3 (L) 07/01/2022   ALBUMIN 2.1 (L) 07/01/2022   PREALBUMIN 16.5 (L) 01/07/2022    Neurologic: Patient does not have protective sensation bilateral lower extremities.   MUSCULOSKELETAL:   Ortho exam demonstrates no palpable DP or PT pulse of the right lower extremity.  Significant right heel wound with active drainage  noted.  There is necrotic tissue noted in the center of the wound.  No expressible purulence.  Mild erythema around the borders of the  ulcer.  No streaking erythema or significant cellulitis that extends proximally.  No calf tenderness.  No fluctuant abscess noted throughout the tib-fib region.  Assessment: Right heel wound with calcaneal osteomyelitis  Plan: Plan is continue with broad-spectrum IV antibiotics.  Plan for further evaluation of the status of his ulcer by Dr. Lajoyce Cornersuda on Monday after several days of antibiotics with decision point at that time for or against surgical intervention.  Currently not septic.  Please reach out with any questions or concerns in the meantime.  Thank you for the consult and the opportunity to see Mr. Audree CamelChrisco  Luke Maniya Donovan, PA-C [facility] [phone] 9:42 AM

## 2022-07-02 NOTE — Progress Notes (Signed)
Patient refused CPAP HS tonight. Patient stated he can not tolerate any mask on his face

## 2022-07-02 NOTE — TOC Initial Note (Signed)
Transition of Care [facility]) - Initial/Assessment Note    Patient Details  Name: Joseph [name] MRN: [id number] Date of Birth: Feb 05, 1971  Transition of [facility] CM/SW Contact:    Kermit Balo, RN Phone Number: 07/02/2022, 1:02 PM  Clinical Narrative:                 Pt admitted with acute metabolic encephalopathy and found to have osteomyelitis of rt heel. Plan if for IV abx over the weekend and then Dr [name] to evaluate.  ToC following for d/c needs.   Expected Discharge Plan: Home/Self Care Barriers to Discharge: Continued Medical Work up   Patient Goals and CMS Choice        Expected Discharge Plan and Services Expected Discharge Plan: Home/Self Care       Living arrangements for the past 2 months: Single Family Home                                      Prior Living Arrangements/Services Living arrangements for the past 2 months: Single Family Home Lives with:: Spouse                   Activities of Daily Living      Permission Sought/Granted                  Emotional Assessment              Admission diagnosis:  Dehydration [E86.0] Delirium [R41.0] Hypercapnia [R06.89] Elevated troponin [R77.8] Acute encephalopathy [G93.40] Right foot ulcer, with unspecified severity (HCC) [L97.519] Acute metabolic encephalopathy [G93.41] Patient Active Problem List   Diagnosis Date Noted   Hx of BKA, left (HCC) 07/01/2022   Fall at home, initial encounter 07/01/2022   Syncope and collapse 06/15/2022   AMS (altered mental status) 04/14/2022   Acute respiratory failure with hypoxia (HCC) 04/14/2022   Acute metabolic encephalopathy 04/14/2022   Pneumonia 04/14/2022   Ulcer of right foot, limited to breakdown of skin (HCC)    Below-knee amputation of left lower extremity (HCC)    Foot osteomyelitis, right (HCC) 01/06/2022   Bipolar disorder with depression (HCC)    Sepsis (HCC) 11/18/2021   Chest pain 11/18/2021   Dyspnea 11/18/2021    Wound infection    Ulcer of left foot with necrosis of bone (HCC)    Bicuspid aortic valve 09/14/2021   Aneurysm of ascending aorta (HCC) 09/14/2021   Hyperkalemia 08/20/2021   DM (diabetes mellitus) with peripheral vascular complication (HCC) 08/20/2021   Atypical chest pain 07/17/2021   Hyponatremia 07/17/2021   Leg wound, left 07/17/2021   Essential hypertension 07/17/2021   Acute hyperglycemia 07/09/2021   PAD (peripheral artery disease) (HCC) 10/10/2020   S/P CABG x 4 07/24/2020   Non-ST elevation (NSTEMI) myocardial infarction (HCC) 07/22/2020   (HFpEF) heart failure with preserved ejection fraction (HCC) 07/22/2020   Hypotension 07/18/2020   Elevated troponin 07/18/2020   Acute renal failure superimposed on stage 3a chronic kidney disease (HCC) 07/18/2020   Elevated brain natriuretic peptide (BNP) level 07/18/2020   GERD (gastroesophageal reflux disease) 07/18/2020   Unstable angina (HCC) 11/01/2019   Edema 06/13/2019   Leukocytosis 05/31/2019   Smoker 03/08/2019   Chronic pain 03/08/2019   Chronic back pain 02/16/2019   Deep venous thrombosis (HCC) 02/16/2019   Obesity, Class III, BMI 40-49.9 (morbid obesity) (HCC) 02/16/2019   Peripheral nerve disease 02/16/2019  COPD (chronic obstructive pulmonary disease) (HCC) 01/22/2019   Anxiety and depression 03/26/2018   Sinusitis 10/12/2017   Abdominal pain    Loss of weight    Diabetic foot infection (HCC) 09/16/2017   Type 2 diabetes mellitus with right diabetic foot infection (HCC) 09/16/2017   Cellulitis of right foot    Recurrent chest pain 07/29/2016   Hyperglycemia    Insulin dependent type 2 diabetes mellitus (HCC) 04/29/2015   OSA (obstructive sleep apnea) 05/08/2013   History of pulmonary embolus (PE) 04/27/2013   CAD (coronary artery disease) 09/10/2008   Hyperlipidemia LDL goal <70 09/09/2008   PCP:  Marva Panda, NP Pharmacy:   [facility] [phone] - [place], [place] - [address] AT [facility]  OF [address] [place] [address] Phone: [phone] Fax: [phone]  [id number] - [place], [place] - [address] AT SEC OF [address] [name] [address] Phone: [phone] Fax: [phone]  [facility] [address] [name] [address] Phone: [phone] Fax: [phone]     Social Determinants of Health (SDOH) Interventions    Readmission Risk Interventions    11/24/2021    4:05 PM 08/20/2021   12:26 PM 07/12/2021   12:29 PM  Readmission Risk Prevention Plan  Transportation Screening Complete Complete Complete  Medication Review (RN Care Manager) Complete Complete Complete  PCP or Specialist appointment within 3-5 days of discharge Complete Complete Complete  HRI or Home Care Consult Complete Complete Complete  SW Recovery Care/Counseling Consult Complete Complete Complete  Palliative Care Screening Not Applicable Not Applicable Not Applicable  Skilled Nursing Facility Complete Not Applicable Not Applicable

## 2022-07-02 NOTE — Progress Notes (Signed)
PROGRESS NOTE  Joseph [name] UXN:[id number] DOB: 1971-02-02   PCP: [name], NP  Patient is from: Home.  Lives with his wife.  Walker and wheelchair at baseline.  DOA: 06/30/2022 LOS: 1  Chief complaints Chief Complaint  Patient presents with   Fatigue     Brief Narrative / Interim history: 51 year old M with PMH of CAD/CABG/stents, diastolic CHF, PE not on AC, COPD, OSA not on CPAP or oxygen, IDDM-2, PAD, chronic right heel ulcer with early osteomyelitis, left BKA, morbid obesity, anxiety, depression and chronic pain presenting with altered mental status headache, nausea with increased lethargy, confusion, shortness of breath, edema and poor p.o. intake, and admitted for acute metabolic encephalopathy in the setting of acute respiratory failure with hypercarbia and diabetic right foot wound infection and osteomyelitis.  Reportedly, he also had a fall the morning of presentation.    In ED, he was hypotensive to 81/57.  Other vital stable.  K6.2. Cr 1.93 (baseline 1.5).  BUN 44.  Troponin 55>> 67.  Lactic acid 2.2>> 1.2.  WBC 10.5.  Hgb 10.0.  EtOH level negative.  CXR showed left basilar atelectasis.  Right foot x-ray shows increased soft tissue swelling and large heel ulcer.  ABG 7.16/70/47/25.  Cultures obtained.  Started on BiPAP, IV fluids, broad-spectrum antibiotics and Lokelma, and admitted.  The next day, encephalopathy resolved.  Weaned off BiPAP.  Orthopedic surgery consulted.   Subjective: Seen and examined earlier this morning.  No major events overnight of this morning.  No complaints other than his chronic pain.  Denies chest pain, shortness of breath, GI or UTI symptoms other than some nausea.  No emesis.  Objective: Vitals:   07/02/22 0400 07/02/22 0839 07/02/22 0839 07/02/22 1152  BP: 111/67  100/76 106/84  Pulse: 81     Resp: 20  18 18   Temp: 98.2 F (36.8 C)  98.7 F (37.1 C)   TempSrc: Oral     SpO2: 97% 97%    Weight:      Height:         Examination:  GENERAL: No apparent distress.  Nontoxic. HEENT: MMM.  Vision and hearing grossly intact.  NECK: Supple.  No apparent JVD.  RESP:  No IWOB.  Fair aeration bilaterally. CVS:  RRR. Heart sounds normal.  ABD/GI/GU: BS+. Abd soft, NTND.  MSK/EXT: Left BKA with some erythema.  Right heel ulcer.  No apparent drainage. SKIN: As above. NEURO: Awake and alert. Oriented appropriately.  No apparent focal neuro deficit. PSYCH: Calm. Normal affect.        Procedures:  None  Microbiology summarized: Blood cultures NGTD. Urine culture with insignificant growth.  Assessment and plan: Principal Problem:   Acute metabolic encephalopathy Active Problems:   Hyperlipidemia LDL goal <70   CAD (coronary artery disease)   History of pulmonary embolus (PE)   OSA (obstructive sleep apnea)   Type 2 diabetes mellitus with right diabetic foot infection (HCC)   Anxiety and depression   COPD (chronic obstructive pulmonary disease) (HCC)   Obesity, Class III, BMI 40-49.9 (morbid obesity) (HCC)   Chronic pain   Acute renal failure superimposed on stage 3a chronic kidney disease (HCC)   GERD (gastroesophageal reflux disease)   (HFpEF) heart failure with preserved ejection fraction (HCC)   S/P CABG x 4   PAD (peripheral artery disease) (HCC)   Essential hypertension   DM (diabetes mellitus) with peripheral vascular complication (HCC)   Foot osteomyelitis, right (HCC)   Hx of BKA, left ([place])  Fall at home, initial encounter  Acute metabolic encephalopathy: Likely due to hypercarbia and polypharmacy.  Resolved. -Treat treatable causes -Minimize sedating medications -Reorientation and delirium precautions  Diabetic right foot ulcer infection and possible early osteomyelitis: Patient with chronic right heel ulcer.  He is followed by Dr. Sharol Given.  MRI on 7/6 with mild linear subperiosteal marrow edema about the posterior aspect of the calcaneus with adjacent ulcer concerning for  osteomyelitis.  He is on doxycycline outpatient. ABI in 12/2021 was basically normal.  He has no fever or leukocytosis.  Blood cultures NGTD.  CRP and ESR elevated.  Sepsis ruled out. -Appreciate input by orthopedic surgery -Plavix on hold.  Last dose the morning of 7/13. -Continue broad-spectrum antibiotics  Acute respiratory failure with hypercarbia/chronic COPD/OSA/possible OHS not on CPAP after he lost his home: Resolved with BiPAP.  On room air.   -BiPAP as needed and at night-not compliant at night. -Minimum oxygen to keep saturation above 90% -Minimize or avoid sedating medications -Continue home inhalers.  Chronic diastolic CHF: TTE in 03/1659 with LVEF of 55 to 60%, G2 DD and no RWMA   Difficult to assess fluid status due to body habitus but does not look fluid overloaded.  On p.o. Lasix 40 mg daily -Continue holding Lasix in the setting of AKI. -Monitor intake and output, daily weight and renal functions  Elevated troponin/history of CAD/CABG and stents/PAD: Mildly elevated troponin likely demand ischemia and delayed clearance from AKI.  Patient has no anginal symptoms.  LHC on 06/17/2022 with severe native CAD with 80% distal left main stenosis with widely patent LIMA graft supplying the LAD and SVG supplying the diagonal vessel.  At that time, cardiology recommended medical therapy with DAPT, optimal blood pressure control and statin. -Continue coreg, aspirin and Ranexa -Hold Plavix in case of surgery.  Hold statin due to elevated CK.  Uncontrolled IDDM-2 with hypoglycemia, CKD-3A, foot ulcer, neuropathy and PAD: A1c 7.1% on 4/27. Recent Labs  Lab 07/01/22 1541 07/01/22 2113 07/02/22 0623 07/02/22 0836 07/02/22 1150  GLUCAP 156* 208* 209* 206* 217*  -Increase SSI to moderate -Add NovoLog 3 units 3 times daily with meals -Monitor CBG  AKI on CKD-3A: Multifactorial-including hypotension, diuretics, rhabdo Recent Labs    04/13/22 1714 04/13/22 1741 04/14/22 0331  04/15/22 0611 04/16/22 0420 06/15/22 1140 06/30/22 1318 06/30/22 2318 07/01/22 0259 07/01/22 1451 07/02/22 0225  BUN 26* 28*  --  24* 26* 36* 44* 48* 47* 48* 50*  CREATININE 1.42* 1.40* 1.54* 1.75* 1.53* 1.44* 1.93* 2.54* 2.57* 2.46* 2.22*  -Continue IV fluid -Avoid nephrotoxic meds -Renally dose medications  Anemia of chronic disease: Drop in Hgb likely dilutional. Recent Labs    04/16/22 0420 06/15/22 1140 06/17/22 1006 06/17/22 1012 06/30/22 1318 06/30/22 1658 06/30/22 1938 07/01/22 0259 07/01/22 0810 07/02/22 0225  HGB 10.5* 11.1* 10.9*  10.9* 10.9* 10.0* 10.5* 9.5* 9.0* 9.5* 8.6*  -Continue monitoring -Check anemia panel in the morning  Hyperkalemia: Improved to 5.3. -Lokelma 10 g x 1  Fall at home: No report of head trauma or LOC.  CT head without acute finding. Traumatic rhabdomyolysis: CK elevated to 2412. Physical deconditioning/fatigue/left BKA -PT/OT consult after surgical evaluation -Fall precaution   History of PE-not on anticoagulation at home.  Anxiety/depression/chronic pain: On Xanax, Wellbutrin, Lexapro, oxycodone 15 mg 5 times a day and Lyrica 300 mg in the morning and 150 mg in the evening at home. -Continue home meds for anxiety and depression except Xanax. -Continue decreased dose of Lyrica 75 mg twice  daily -Oxycodone 5 mg every 6 hours as needed for moderate pain -Oxycodone 10 mg every 6 hours as needed for severe pain  Morbid obesity Body mass index is 41.5 kg/m. -On Victoza.  Pressure skin injury/ulcer: POA -Orthopedic surgery   DVT prophylaxis:  Change Lovenox to subcu heparin  Code Status: Full code Family Communication: Updated patient's wife  Level of care: Progressive Status is: Inpatient The patient will remain inpatient because: diabetic right foot ulcer infection and early osteomyelitis, and AKI   Final disposition: TBD Consultants:  Orthopedic surgery  Sch Meds:  Scheduled Meds:  albuterol  2.5 mg Inhalation  BID   aspirin EC  81 mg Oral Daily   buPROPion  150 mg Oral Daily   carvedilol  3.125 mg Oral BID   enoxaparin (LOVENOX) injection  80 mg Subcutaneous Q24H   escitalopram  10 mg Oral q AM   icosapent Ethyl  2 g Oral BID   insulin aspart  0-15 Units Subcutaneous TID WC   insulin aspart  0-5 Units Subcutaneous QHS   insulin aspart  3 Units Subcutaneous TID WC   insulin glargine-yfgn  25 Units Subcutaneous BID   pantoprazole  40 mg Oral Daily   pregabalin  75 mg Oral BID   ranolazine  500 mg Oral BID   senna  2 tablet Oral Daily   sodium chloride flush  3 mL Intravenous Q12H   Continuous Infusions:  sodium chloride 125 mL/hr at 07/02/22 0449   ceFEPime (MAXIPIME) IV 2 g (07/02/22 1246)   metronidazole 500 mg (07/02/22 1107)   vancomycin 1,500 mg (07/01/22 1843)   PRN Meds:.acetaminophen **OR** acetaminophen, naloxone, ondansetron **OR** ondansetron (ZOFRAN) IV, mouth rinse, oxyCODONE  Antimicrobials: Anti-infectives (From admission, onward)    Start     Dose/Rate Route Frequency Ordered Stop   07/01/22 1800  vancomycin (VANCOREADY) IVPB 1500 mg/300 mL        1,500 mg 150 mL/hr over 120 Minutes Intravenous Every 24 hours 07/01/22 1122     06/30/22 2200  doxycycline (VIBRA-TABS) tablet 100 mg  Status:  Discontinued        100 mg Oral 2 times daily 06/30/22 1924 06/30/22 1946   06/30/22 2100  metroNIDAZOLE (FLAGYL) IVPB 500 mg        500 mg 100 mL/hr over 60 Minutes Intravenous Every 12 hours 06/30/22 2034     06/30/22 2042  vancomycin variable dose per unstable renal function (pharmacist dosing)  Status:  Discontinued         Does not apply See admin instructions 06/30/22 2042 07/01/22 1122   06/30/22 1700  vancomycin (VANCOCIN) 2,500 mg in sodium chloride 0.9 % 500 mL IVPB        2,500 mg 262.5 mL/hr over 120 Minutes Intravenous  Once 06/30/22 1645 06/30/22 2005   06/30/22 1700  ceFEPIme (MAXIPIME) 2 g in sodium chloride 0.9 % 100 mL IVPB        2 g 200 mL/hr over 30 Minutes  Intravenous Every 8 hours 06/30/22 1646          I have personally reviewed the following labs and images: CBC: Recent Labs  Lab 06/30/22 1318 06/30/22 1658 06/30/22 1938 07/01/22 0259 07/01/22 0810 07/02/22 0225  WBC 10.5  --   --  9.7  --  7.5  NEUTROABS 7.7  --   --   --   --   --   HGB 10.0* 10.5* 9.5* 9.0* 9.5* 8.6*  HCT 33.8* 31.0* 28.0* 31.7* 28.0*  28.4*  MCV 80.1  --   --  84.1  --  79.8*  PLT 290  --   --  241  --  237   BMP &GFR Recent Labs  Lab 06/30/22 1318 06/30/22 1658 06/30/22 2318 07/01/22 0259 07/01/22 0810 07/01/22 1451 07/02/22 0225  NA 140   < > 141 140 141 138 137  K 6.2*   < > 5.5* 5.3* 5.8* 6.2* 5.3*  CL 106  --  108 110  --  106 104  CO2 22  --  24 22  --  22 22  GLUCOSE 162*  --  67* 90  --  139* 231*  BUN 44*  --  48* 47*  --  48* 50*  CREATININE 1.93*  --  2.54* 2.57*  --  2.46* 2.22*  CALCIUM 8.8*  --  8.2* 7.8*  --  8.4* 7.9*  MG  --   --   --   --   --  1.5* 1.3*  PHOS  --   --   --   --   --  4.3 3.5   < > = values in this interval not displayed.   Estimated Creatinine Clearance: 66.6 mL/min (A) (by C-G formula based on SCr of 2.22 mg/dL (H)). Liver & Pancreas: Recent Labs  Lab 06/30/22 1318 07/01/22 0259 07/01/22 1451 07/02/22 0225  AST 24 16  --   --   ALT 17 14  --   --   ALKPHOS 107 81  --   --   BILITOT 0.5 0.5  --   --   PROT 6.5 5.9*  --   --   ALBUMIN 2.4* 2.1* 2.3* 2.0*   No results for input(s): "LIPASE", "AMYLASE" in the last 168 hours. Recent Labs  Lab 07/01/22 0800  AMMONIA 14   Diabetic: No results for input(s): "HGBA1C" in the last 72 hours. Recent Labs  Lab 07/01/22 1541 07/01/22 2113 07/02/22 0623 07/02/22 0836 07/02/22 1150  GLUCAP 156* 208* 209* 206* 217*   Cardiac Enzymes: Recent Labs  Lab 07/01/22 0800 07/02/22 0225  CKTOTAL 367 2,412*   No results for input(s): "PROBNP" in the last 8760 hours. Coagulation Profile: No results for input(s): "INR", "PROTIME" in the last 168  hours. Thyroid Function Tests: Recent Labs    07/01/22 0800  TSH 2.506   Lipid Profile: No results for input(s): "CHOL", "HDL", "LDLCALC", "TRIG", "CHOLHDL", "LDLDIRECT" in the last 72 hours. Anemia Panel: Recent Labs    07/01/22 0800  VITAMINB12 209   Urine analysis:    Component Value Date/Time   COLORURINE AMBER (A) 06/30/2022 1206   APPEARANCEUR HAZY (A) 06/30/2022 1206   LABSPEC 1.024 06/30/2022 1206   PHURINE 5.0 06/30/2022 1206   GLUCOSEU NEGATIVE 06/30/2022 1206   HGBUR NEGATIVE 06/30/2022 1206   BILIRUBINUR NEGATIVE 06/30/2022 1206   KETONESUR NEGATIVE 06/30/2022 1206   PROTEINUR 100 (A) 06/30/2022 1206   UROBILINOGEN 0.2 10/27/2007 1550   NITRITE NEGATIVE 06/30/2022 1206   LEUKOCYTESUR NEGATIVE 06/30/2022 1206   Sepsis Labs: Invalid input(s): "PROCALCITONIN", "LACTICIDVEN"  Microbiology: Recent Results (from the past 240 hour(s))  Blood culture (routine x 2)     Status: None (Preliminary result)   Collection Time: 06/30/22  1:18 PM   Specimen: BLOOD  Result Value Ref Range Status   Specimen Description BLOOD SITE NOT SPECIFIED  Final   Special Requests   Final    BOTTLES DRAWN AEROBIC AND ANAEROBIC Blood Culture results may not be optimal due  to an inadequate volume of blood received in culture bottles   Culture   Final    NO GROWTH 2 DAYS Performed at [facility], [place] [address], [place], [place] [address]    Report Status PENDING  Incomplete  Blood culture (routine x 2)     Status: None (Preliminary result)   Collection Time: 06/30/22  4:32 PM   Specimen: BLOOD  Result Value Ref Range Status   Specimen Description BLOOD RIGHT ANTECUBITAL  Final   Special Requests   Final    BOTTLES DRAWN AEROBIC AND ANAEROBIC Blood Culture results may not be optimal due to an excessive volume of blood received in culture bottles   Culture   Final    NO GROWTH 2 DAYS Performed at [facility], [place] [address], [place], [place] [address]    Report  Status PENDING  Incomplete  Urine Culture     Status: Abnormal   Collection Time: 07/01/22 12:06 PM   Specimen: Urine, Clean Catch  Result Value Ref Range Status   Specimen Description URINE, CLEAN CATCH  Final   Special Requests NONE  Final   Culture (A)  Final    <10,000 COLONIES/mL INSIGNIFICANT GROWTH Performed at [facility], [place] [address] [place],  [address]    Report Status 07/02/2022 FINAL  Final    Radiology Studies: No results found.    Dequon Schnebly T. [place]  If 7PM-7AM, please contact night-coverage [email or web address] 07/02/2022, 3:10 PM

## 2022-07-03 DIAGNOSIS — I5032 Chronic diastolic (congestive) heart failure: Secondary | ICD-10-CM | POA: Diagnosis not present

## 2022-07-03 DIAGNOSIS — E11628 Type 2 diabetes mellitus with other skin complications: Secondary | ICD-10-CM | POA: Diagnosis not present

## 2022-07-03 DIAGNOSIS — Z951 Presence of aortocoronary bypass graft: Secondary | ICD-10-CM | POA: Diagnosis not present

## 2022-07-03 DIAGNOSIS — G9341 Metabolic encephalopathy: Secondary | ICD-10-CM | POA: Diagnosis not present

## 2022-07-03 LAB — RENAL FUNCTION PANEL
Albumin: 2.1 g/dL — ABNORMAL LOW (ref 3.5–5.0)
Anion gap: 6 (ref 5–15)
BUN: 36 mg/dL — ABNORMAL HIGH (ref 6–20)
CO2: 22 mmol/L (ref 22–32)
Calcium: 8.2 mg/dL — ABNORMAL LOW (ref 8.9–10.3)
Chloride: 111 mmol/L (ref 98–111)
Creatinine, Ser: 1.44 mg/dL — ABNORMAL HIGH (ref 0.61–1.24)
GFR, Estimated: 60 mL/min — ABNORMAL LOW (ref >60)
Glucose, Bld: 177 mg/dL — ABNORMAL HIGH (ref 70–99)
Phosphorus: 2.5 mg/dL (ref 2.5–4.6)
Potassium: 5.5 mmol/L — ABNORMAL HIGH (ref 3.5–5.1)
Sodium: 139 mmol/L (ref 135–145)

## 2022-07-03 LAB — HEMOGLOBIN A1C
Hgb A1c MFr Bld: 7.2 % — ABNORMAL HIGH (ref 4.8–5.6)
Mean Plasma Glucose: 159.94 mg/dL

## 2022-07-03 LAB — VITAMIN B12: Vitamin B-12: 1654 pg/mL — ABNORMAL HIGH (ref 180–914)

## 2022-07-03 LAB — MAGNESIUM: Magnesium: 1.6 mg/dL — ABNORMAL LOW (ref 1.7–2.4)

## 2022-07-03 LAB — GLUCOSE, CAPILLARY
Glucose-Capillary: 118 mg/dL — ABNORMAL HIGH (ref 70–99)
Glucose-Capillary: 148 mg/dL — ABNORMAL HIGH (ref 70–99)
Glucose-Capillary: 234 mg/dL — ABNORMAL HIGH (ref 70–99)
Glucose-Capillary: 164 mg/dL — ABNORMAL HIGH (ref 70–99)

## 2022-07-03 LAB — CBC
HCT: 29.7 % — ABNORMAL LOW (ref 39.0–52.0)
Hemoglobin: 9.1 g/dL — ABNORMAL LOW (ref 13.0–17.0)
MCH: 24.1 pg — ABNORMAL LOW (ref 26.0–34.0)
MCHC: 30.6 g/dL (ref 30.0–36.0)
MCV: 78.6 fL — ABNORMAL LOW (ref 80.0–100.0)
Platelets: 246 10*3/uL (ref 150–400)
RBC: 3.78 MIL/uL — ABNORMAL LOW (ref 4.22–5.81)
RDW: 18.1 % — ABNORMAL HIGH (ref 11.5–15.5)
WBC: 6.5 10*3/uL (ref 4.0–10.5)
nRBC: 0 % (ref 0.0–0.2)

## 2022-07-03 LAB — IRON AND TIBC
Iron: 31 ug/dL — ABNORMAL LOW (ref 45–182)
Saturation Ratios: 12 % — ABNORMAL LOW (ref 17.9–39.5)
TIBC: 252 ug/dL (ref 250–450)
UIBC: 221 ug/dL

## 2022-07-03 LAB — VANCOMYCIN, TROUGH: Vancomycin Tr: 11 ug/mL — ABNORMAL LOW (ref 15–20)

## 2022-07-03 LAB — FERRITIN: Ferritin: 43 ng/mL (ref 24–336)

## 2022-07-03 LAB — RETICULOCYTES
Immature Retic Fract: 31.2 % — ABNORMAL HIGH (ref 2.3–15.9)
RBC.: 3.77 MIL/uL — ABNORMAL LOW (ref 4.22–5.81)
Retic Count, Absolute: 84.4 10*3/uL (ref 19.0–186.0)
Retic Ct Pct: 2.2 % (ref 0.4–3.1)

## 2022-07-03 LAB — FOLATE: Folate: 9.9 ng/mL (ref >5.9)

## 2022-07-03 MED ORDER — SODIUM ZIRCONIUM CYCLOSILICATE 10 G PO PACK
10.0000 g | PACK | Freq: Once | ORAL | Status: AC
  Administered 2022-07-03: 10 g via ORAL
  Filled 2022-07-03: qty 1

## 2022-07-03 MED ORDER — MAGNESIUM SULFATE 2 GM/50ML IV SOLN
2.0000 g | Freq: Once | INTRAVENOUS | Status: AC
  Administered 2022-07-03: 2 g via INTRAVENOUS
  Filled 2022-07-03: qty 50

## 2022-07-03 MED ORDER — SODIUM CHLORIDE 0.9 % IV SOLN: INTRAVENOUS | Status: AC

## 2022-07-03 MED ORDER — ALBUTEROL SULFATE (2.5 MG/3ML) 0.083% IN NEBU: 2.5000 mg | INHALATION_SOLUTION | RESPIRATORY_TRACT | Status: DC | PRN

## 2022-07-03 MED ORDER — SODIUM CHLORIDE 0.9 % IV SOLN
250.0000 mg | Freq: Once | INTRAVENOUS | Status: AC
  Administered 2022-07-03: 250 mg via INTRAVENOUS
  Filled 2022-07-03: qty 20

## 2022-07-03 NOTE — Progress Notes (Signed)
Pharmacy Antibiotic Note-Follow Up  Quentyn Kolbeck Guidice is a 51 y.o. male for which pharmacy has been consulted for cefepime dosing for  osteomyelitis .  Patient with a history of diabetes, CHF, CAD AD, chronic opiate use, COPD, prior MI on Plavix, AAA. Patient presenting with generalized fatigue. Work up in the ED significant for foot XR w/ worsening of the dorsal midfoot soft tissue swelling as well as a large heel ulcer appears worse since prior exam.  SCr trended up to 2.57 and leveling off to 1.44 (baseline ~1.5). 7/14: VP 39, 7/15 VT 11, calculated AUC came back as 531.5   Plan: Flagyl 500mg  IV q12h Cefepime 2g IV q8h Continue Vancomycin 1500mg  IV q24h. Goal AUC 400-550. Monitor clinical progress, c/s, renal function F/u de-escalation plan/LOT, vancomycin levels as indicated   Height: 6\' 5"  (195.6 cm) Weight: (!) 158.8 kg (350 lb) IBW/kg (Calculated) : 89.1  Temp (24hrs), Avg:98.3 F (36.8 C), Min:98.2 F (36.8 C), Max:98.3 F (36.8 C)  Recent Labs  Lab 06/30/22 1318 06/30/22 1320 06/30/22 1935 06/30/22 2318 07/01/22 0259 07/01/22 1451 07/02/22 0225 07/02/22 2054 07/03/22 0325 07/03/22 1919  WBC 10.5  --   --   --  9.7  --  7.5  --  6.5  --   CREATININE 1.93*  --   --  2.54* 2.57* 2.46* 2.22*  --  1.44*  --   LATICACIDVEN  --  2.2* 1.2  --   --   --   --   --   --   --   VANCOTROUGH  --   --   --   --   --   --   --   --   --  11*  VANCOPEAK  --   --   --   --   --   --   --  39  --   --      Estimated Creatinine Clearance: 102.7 mL/min (A) (by C-G formula based on SCr of 1.44 mg/dL (H)).    Allergies  Allergen Reactions   Sulfa Antibiotics Anaphylaxis and Other (See Comments)    Headaches     07/04/22, PharmD. [facility] Acute Care PGY-1 07/03/2022 8:47 PM

## 2022-07-03 NOTE — Progress Notes (Signed)
PROGRESS NOTE  Joseph [name] GTX:[id number] DOB: April 17, 1971   PCP: [name], NP  Patient is from: Home.  Lives with his wife.  [name] and wheelchair at baseline.  DOA: 06/30/2022 LOS: 2  Chief complaints Chief Complaint  Patient presents with   Fatigue     Brief Narrative / Interim history: 51 year old M with PMH of CAD/CABG/stents, diastolic CHF, PE not on AC, COPD, OSA not on CPAP or oxygen, IDDM-2, PAD, chronic right heel ulcer with early osteomyelitis, left BKA, morbid obesity, anxiety, depression and chronic pain presenting with altered mental status headache, nausea with increased lethargy, confusion, shortness of breath, edema and poor p.o. intake, and admitted for acute metabolic encephalopathy in the setting of acute respiratory failure with hypercarbia and diabetic right foot wound infection and osteomyelitis.  Reportedly, he also had a fall the morning of presentation.    In ED, he was hypotensive to 81/57.  Other vital stable.  K6.2. Cr 1.93 (baseline 1.5).  BUN 44.  Troponin 55>> 67.  Lactic acid 2.2>> 1.2.  WBC 10.5.  Hgb 10.0.  EtOH level negative.  CXR showed left basilar atelectasis.  Right foot x-ray shows increased soft tissue swelling and large heel ulcer.  ABG 7.16/70/47/25.  Cultures obtained.  Started on BiPAP, IV fluids, broad-spectrum antibiotics and Lokelma, and admitted.  The next day, encephalopathy resolved.  Weaned off BiPAP.  Orthopedic surgery consulted, and will evaluate patient on Monday.  Holding Plavix in case Dr. Sharol Given want to proceed with surgery  Subjective: Seen and examined earlier this morning.  No major events overnight of this morning.  No complaints.  Feels well.  Pain fairly controlled.  Objective: Vitals:   07/02/22 1946 07/02/22 1948 07/02/22 2350 07/03/22 0837  BP: 99/69  122/84   Pulse: 75  71   Resp: 16  17   Temp: 98.1 F (36.7 C)  98.3 F (36.8 C)   TempSrc: Oral     SpO2: 99% 99% 99% 98%  Weight:      Height:         Examination:  GENERAL: No apparent distress.  Nontoxic. HEENT: MMM.  Vision and hearing grossly intact.  NECK: Supple.  No apparent JVD.  RESP:  No IWOB.  Fair aeration bilaterally. CVS:  RRR. Heart sounds normal.  ABD/GI/GU: BS+. Abd soft, NTND.  MSK/EXT: Left BKA with some erythema.  Right heel ulcer.  No apparent drainage. SKIN: As above. NEURO: Awake and alert. Oriented appropriately.  No apparent focal neuro deficit. PSYCH: Calm. Normal affect.   GENERAL: No apparent distress.  Nontoxic. HEENT: MMM.  Vision and hearing grossly intact.  NECK: Supple.  No apparent JVD.  RESP:  No IWOB.  Fair aeration bilaterally. CVS:  RRR. Heart sounds normal.  ABD/GI/GU: BS+. Abd soft, NTND.  MSK/EXT: Left BKA with some erythema.  Right heel ulcer. SKIN: As above. NEURO: Awake and alert. Oriented appropriately.  No apparent focal neuro deficit. PSYCH: Calm. Normal affect.   Procedures:  None  Microbiology summarized: Blood cultures NGTD. Urine culture with insignificant growth.  Assessment and plan: Principal Problem:   Acute metabolic encephalopathy Active Problems:   Hyperlipidemia LDL goal <70   CAD (coronary artery disease)   History of pulmonary embolus (PE)   OSA (obstructive sleep apnea)   Type 2 diabetes mellitus with right diabetic foot infection (HCC)   Anxiety and depression   COPD (chronic obstructive pulmonary disease) (HCC)   Obesity, Class III, BMI 40-49.9 (morbid obesity) (HCC)   Chronic pain  Acute renal failure superimposed on stage 3a chronic kidney disease (HCC)   GERD (gastroesophageal reflux disease)   (HFpEF) heart failure with preserved ejection fraction (HCC)   S/P CABG x 4   PAD (peripheral artery disease) ([place])   Essential hypertension   DM (diabetes mellitus) with peripheral vascular complication (HCC)   Foot osteomyelitis, right (HCC)   Hx of BKA, left ([place])   Fall at home, initial encounter  Acute metabolic encephalopathy: Likely due to  hypercarbia and polypharmacy.  Resolved. -Treat treatable causes -Minimize sedating medications -Reorientation and delirium precautions  Diabetic right foot ulcer infection and possible early osteomyelitis: Patient with chronic right heel ulcer.  He is followed by Dr. Sharol Given.  MRI on 7/6 with mild linear subperiosteal marrow edema about the posterior aspect of the calcaneus with adjacent ulcer concerning for osteomyelitis.  He is on doxycycline outpatient. ABI in 12/2021 was basically normal.  He has no fever or leukocytosis.  Blood cultures NGTD.  CRP and ESR elevated.  Sepsis ruled out. -Appreciate input by orthopedic surgery -Plavix on hold.  Last dose the morning of 7/13. -Continue broad-spectrum antibiotics  Acute respiratory failure with hypercarbia/chronic COPD/OSA/possible OHS not on CPAP after he lost his home: Resolved with BiPAP.  On room air.   -BiPAP as needed and at night-not compliant at night. -Minimum oxygen to keep saturation above 90% if needed -Minimize or avoid sedating medications -Continue home inhalers.  Chronic diastolic CHF: TTE in 0/0867 with LVEF of 55 to 60%, G2 DD and no RWMA   Difficult to assess fluid status due to body habitus but does not look fluid overloaded.  On p.o. Lasix 40 mg daily -Continue holding Lasix in the setting of AKI. -Monitor intake and output, daily weight and renal functions  Elevated troponin/history of CAD/CABG and stents/PAD: Mildly elevated troponin likely demand ischemia and delayed clearance from AKI.  Patient has no anginal symptoms.  LHC on 06/17/2022 with severe native CAD with 80% distal left main stenosis with widely patent LIMA graft supplying the LAD and SVG supplying the diagonal vessel.  At that time, cardiology recommended medical therapy with DAPT, optimal blood pressure control and statin. -Continue coreg, aspirin and Ranexa -Hold Plavix in case of surgery.  Hold statin due to elevated CK.  Uncontrolled IDDM-2 with  hypoglycemia, CKD-3A, foot ulcer, neuropathy and PAD: A1c 7.1% on 4/27. Recent Labs  Lab 07/02/22 1150 07/02/22 1645 07/02/22 2137 07/03/22 0628 07/03/22 1216  GLUCAP 217* 149* 181* 118* 148*  -Continue SSI-moderate -Continue NovoLog 3 units 3 times daily with meals -Monitor CBG  AKI on CKD-3A: Multifactorial-including hypotension, diuretics, rhabdo.  AKI resolved. Recent Labs    04/13/22 1741 04/14/22 0331 04/15/22 0611 04/16/22 0420 06/15/22 1140 06/30/22 1318 06/30/22 2318 07/01/22 0259 07/01/22 1451 07/02/22 0225 07/03/22 0325  BUN 28*  --  24* 26* 36* 44* 48* 47* 48* 50* 36*  CREATININE 1.40* 1.54* 1.75* 1.53* 1.44* 1.93* 2.54* 2.57* 2.46* 2.22* 1.44*  -Continue IV fluid -Avoid nephrotoxic meds -Renally dose medications  Anemia of chronic disease: Drop in Hgb likely dilutional.  Anemia panel with some degree of iron deficiency Recent Labs    06/15/22 1140 06/17/22 1006 06/17/22 1012 06/30/22 1318 06/30/22 1658 06/30/22 1938 07/01/22 0259 07/01/22 0810 07/02/22 0225 07/03/22 0325  HGB 11.1* 10.9*  10.9* 10.9* 10.0* 10.5* 9.5* 9.0* 9.5* 8.6* 9.1*  -Continue monitoring -IV ferric gluconate x1  Hyperkalemia/hypomagnesemia: K5.5. -Lokelma 10 g x 1 -IV magnesium sulfate 2 g x 1  Fall at home:  No report of head trauma or LOC.  CT head without acute finding. Traumatic rhabdomyolysis: CK elevated to 2412. Physical deconditioning/fatigue/left BKA -PT/OT consult after surgical evaluation -Fall precaution   History of PE-not on anticoagulation at home.  Anxiety/depression/chronic pain: On Xanax, Wellbutrin, Lexapro, oxycodone 15 mg 5 times a day and Lyrica 300 mg in the morning and 150 mg in the evening at home. -Continue home meds for anxiety and depression except Xanax. -Continue decreased dose of Lyrica 75 mg twice daily -Oxycodone 5 mg every 6 hours as needed for moderate pain -Oxycodone 10 mg every 6 hours as needed for severe pain  Morbid  obesity Body mass index is 41.5 kg/m. -On Victoza.  Pressure skin injury/ulcer: POA -Orthopedic surgery   DVT prophylaxis:  Change Lovenox to subcu heparin  Code Status: Full code Family Communication: Updated patient's wife on 7/14. Level of care: Progressive Status is: Inpatient The patient will remain inpatient because: diabetic right foot ulcer infection and early osteomyelitis, and AKI   Final disposition: TBD Consultants:  Orthopedic surgery  Sch Meds:  Scheduled Meds:  aspirin EC  81 mg Oral Daily   buPROPion  150 mg Oral Daily   carvedilol  3.125 mg Oral BID   enoxaparin (LOVENOX) injection  80 mg Subcutaneous Q24H   escitalopram  10 mg Oral q AM   icosapent Ethyl  2 g Oral BID   insulin aspart  0-15 Units Subcutaneous TID WC   insulin aspart  0-5 Units Subcutaneous QHS   insulin aspart  3 Units Subcutaneous TID WC   insulin glargine-yfgn  25 Units Subcutaneous BID   pantoprazole  40 mg Oral Daily   pregabalin  75 mg Oral BID   ranolazine  500 mg Oral BID   senna  2 tablet Oral Daily   sodium chloride flush  3 mL Intravenous Q12H   Continuous Infusions:  ceFEPime (MAXIPIME) IV 2 g (07/03/22 1311)   metronidazole 500 mg (07/03/22 0901)   vancomycin 1,500 mg (07/02/22 1915)   PRN Meds:.acetaminophen **OR** acetaminophen, albuterol, naloxone, ondansetron **OR** ondansetron (ZOFRAN) IV, mouth rinse, oxyCODONE  Antimicrobials: Anti-infectives (From admission, onward)    Start     Dose/Rate Route Frequency Ordered Stop   07/01/22 1800  vancomycin (VANCOREADY) IVPB 1500 mg/300 mL        1,500 mg 150 mL/hr over 120 Minutes Intravenous Every 24 hours 07/01/22 1122     06/30/22 2200  doxycycline (VIBRA-TABS) tablet 100 mg  Status:  Discontinued        100 mg Oral 2 times daily 06/30/22 1924 06/30/22 1946   06/30/22 2100  metroNIDAZOLE (FLAGYL) IVPB 500 mg        500 mg 100 mL/hr over 60 Minutes Intravenous Every 12 hours 06/30/22 2034     06/30/22 2042   vancomycin variable dose per unstable renal function (pharmacist dosing)  Status:  Discontinued         Does not apply See admin instructions 06/30/22 2042 07/01/22 1122   06/30/22 1700  vancomycin (VANCOCIN) 2,500 mg in sodium chloride 0.9 % 500 mL IVPB        2,500 mg 262.5 mL/hr over 120 Minutes Intravenous  Once 06/30/22 1645 06/30/22 2005   06/30/22 1700  ceFEPIme (MAXIPIME) 2 g in sodium chloride 0.9 % 100 mL IVPB        2 g 200 mL/hr over 30 Minutes Intravenous Every 8 hours 06/30/22 1646          I have personally reviewed  the following labs and images: CBC: Recent Labs  Lab 06/30/22 1318 06/30/22 1658 06/30/22 1938 07/01/22 0259 07/01/22 0810 07/02/22 0225 07/03/22 0325  WBC 10.5  --   --  9.7  --  7.5 6.5  NEUTROABS 7.7  --   --   --   --   --   --   HGB 10.0*   < > 9.5* 9.0* 9.5* 8.6* 9.1*  HCT 33.8*   < > 28.0* 31.7* 28.0* 28.4* 29.7*  MCV 80.1  --   --  84.1  --  79.8* 78.6*  PLT 290  --   --  241  --  237 246   < > = values in this interval not displayed.   BMP &GFR Recent Labs  Lab 06/30/22 2318 07/01/22 0259 07/01/22 0810 07/01/22 1451 07/02/22 0225 07/03/22 0325  NA 141 140 141 138 137 139  K 5.5* 5.3* 5.8* 6.2* 5.3* 5.5*  CL 108 110  --  106 104 111  CO2 24 22  --  22 22 22   GLUCOSE 67* 90  --  139* 231* 177*  BUN 48* 47*  --  48* 50* 36*  CREATININE 2.54* 2.57*  --  2.46* 2.22* 1.44*  CALCIUM 8.2* 7.8*  --  8.4* 7.9* 8.2*  MG  --   --   --  1.5* 1.3* 1.6*  PHOS  --   --   --  4.3 3.5 2.5   Estimated Creatinine Clearance: 102.7 mL/min (A) (by C-G formula based on SCr of 1.44 mg/dL (H)). Liver & Pancreas: Recent Labs  Lab 06/30/22 1318 07/01/22 0259 07/01/22 1451 07/02/22 0225 07/03/22 0325  AST 24 16  --   --   --   ALT 17 14  --   --   --   ALKPHOS 107 81  --   --   --   BILITOT 0.5 0.5  --   --   --   PROT 6.5 5.9*  --   --   --   ALBUMIN 2.4* 2.1* 2.3* 2.0* 2.1*   No results for input(s): "LIPASE", "AMYLASE" in the last 168  hours. Recent Labs  Lab 07/01/22 0800  AMMONIA 14   Diabetic: Recent Labs    07/03/22 0325  HGBA1C 7.2*   Recent Labs  Lab 07/02/22 1150 07/02/22 1645 07/02/22 2137 07/03/22 0628 07/03/22 1216  GLUCAP 217* 149* 181* 118* 148*   Cardiac Enzymes: Recent Labs  Lab 07/01/22 0800 07/02/22 0225  CKTOTAL 367 2,412*   No results for input(s): "PROBNP" in the last 8760 hours. Coagulation Profile: No results for input(s): "INR", "PROTIME" in the last 168 hours. Thyroid Function Tests: Recent Labs    07/01/22 0800  TSH 2.506   Lipid Profile: No results for input(s): "CHOL", "HDL", "LDLCALC", "TRIG", "CHOLHDL", "LDLDIRECT" in the last 72 hours. Anemia Panel: Recent Labs    07/01/22 0800 07/03/22 0325  VITAMINB12 209 1,654*  FOLATE  --  9.9  FERRITIN  --  43  TIBC  --  252  IRON  --  31*  RETICCTPCT  --  2.2   Urine analysis:    Component Value Date/Time   COLORURINE AMBER (A) 06/30/2022 1206   APPEARANCEUR HAZY (A) 06/30/2022 1206   LABSPEC 1.024 06/30/2022 1206   PHURINE 5.0 06/30/2022 1206   GLUCOSEU NEGATIVE 06/30/2022 1206   HGBUR NEGATIVE 06/30/2022 1206   BILIRUBINUR NEGATIVE 06/30/2022 1206   KETONESUR NEGATIVE 06/30/2022 1206   PROTEINUR 100 (A) 06/30/2022 1206  UROBILINOGEN 0.2 10/27/2007 1550   NITRITE NEGATIVE 06/30/2022 1206   LEUKOCYTESUR NEGATIVE 06/30/2022 1206   Sepsis Labs: Invalid input(s): "PROCALCITONIN", "LACTICIDVEN"  Microbiology: Recent Results (from the past 240 hour(s))  Blood culture (routine x 2)     Status: None (Preliminary result)   Collection Time: 06/30/22  1:18 PM   Specimen: BLOOD  Result Value Ref Range Status   Specimen Description BLOOD SITE NOT SPECIFIED  Final   Special Requests   Final    BOTTLES DRAWN AEROBIC AND ANAEROBIC Blood Culture results may not be optimal due to an inadequate volume of blood received in culture bottles   Culture   Final    NO GROWTH 3 DAYS Performed at [facility], [place] [address], [place], [place] [address]    Report Status PENDING  Incomplete  Blood culture (routine x 2)     Status: None (Preliminary result)   Collection Time: 06/30/22  4:32 PM   Specimen: BLOOD  Result Value Ref Range Status   Specimen Description BLOOD RIGHT ANTECUBITAL  Final   Special Requests   Final    BOTTLES DRAWN AEROBIC AND ANAEROBIC Blood Culture results may not be optimal due to an excessive volume of blood received in culture bottles   Culture   Final    NO GROWTH 3 DAYS Performed at [facility], [place] [address], [place], [place] [address]    Report Status PENDING  Incomplete  Urine Culture     Status: Abnormal   Collection Time: 07/01/22 12:06 PM   Specimen: Urine, Clean Catch  Result Value Ref Range Status   Specimen Description URINE, CLEAN CATCH  Final   Special Requests NONE  Final   Culture (A)  Final    <10,000 COLONIES/mL INSIGNIFICANT GROWTH Performed at [facility], [place] [address], [place], [place] [address]    Report Status 07/02/2022 FINAL  Final    Radiology Studies: No results found.    Renelda Kilian T. [place]  If 7PM-7AM, please contact night-coverage [email or web address] 07/03/2022, 4:12 PM

## 2022-07-04 DIAGNOSIS — I25119 Atherosclerotic heart disease of native coronary artery with unspecified angina pectoris: Secondary | ICD-10-CM | POA: Diagnosis not present

## 2022-07-04 DIAGNOSIS — N179 Acute kidney failure, unspecified: Secondary | ICD-10-CM | POA: Diagnosis not present

## 2022-07-04 DIAGNOSIS — G9341 Metabolic encephalopathy: Secondary | ICD-10-CM | POA: Diagnosis not present

## 2022-07-04 DIAGNOSIS — N1831 Chronic kidney disease, stage 3a: Secondary | ICD-10-CM

## 2022-07-04 DIAGNOSIS — M86271 Subacute osteomyelitis, right ankle and foot: Secondary | ICD-10-CM | POA: Diagnosis not present

## 2022-07-04 LAB — COMPREHENSIVE METABOLIC PANEL
ALT: 20 U/L (ref 0–44)
AST: 28 U/L (ref 15–41)
Albumin: 2.2 g/dL — ABNORMAL LOW (ref 3.5–5.0)
Alkaline Phosphatase: 73 U/L (ref 38–126)
Anion gap: 11 (ref 5–15)
BUN: 28 mg/dL — ABNORMAL HIGH (ref 6–20)
CO2: 20 mmol/L — ABNORMAL LOW (ref 22–32)
Calcium: 8.6 mg/dL — ABNORMAL LOW (ref 8.9–10.3)
Chloride: 110 mmol/L (ref 98–111)
Creatinine, Ser: 1.34 mg/dL — ABNORMAL HIGH (ref 0.61–1.24)
GFR, Estimated: >60 mL/min (ref >60)
Glucose, Bld: 151 mg/dL — ABNORMAL HIGH (ref 70–99)
Potassium: 5.3 mmol/L — ABNORMAL HIGH (ref 3.5–5.1)
Sodium: 141 mmol/L (ref 135–145)
Total Bilirubin: 0.2 mg/dL — ABNORMAL LOW (ref 0.3–1.2)
Total Protein: 5.8 g/dL — ABNORMAL LOW (ref 6.5–8.1)

## 2022-07-04 LAB — CBC
HCT: 31.2 % — ABNORMAL LOW (ref 39.0–52.0)
Hemoglobin: 9.5 g/dL — ABNORMAL LOW (ref 13.0–17.0)
MCH: 23.8 pg — ABNORMAL LOW (ref 26.0–34.0)
MCHC: 30.4 g/dL (ref 30.0–36.0)
MCV: 78 fL — ABNORMAL LOW (ref 80.0–100.0)
Platelets: 254 10*3/uL (ref 150–400)
RBC: 4 MIL/uL — ABNORMAL LOW (ref 4.22–5.81)
RDW: 18 % — ABNORMAL HIGH (ref 11.5–15.5)
WBC: 7.7 10*3/uL (ref 4.0–10.5)
nRBC: 0 % (ref 0.0–0.2)

## 2022-07-04 LAB — GLUCOSE, CAPILLARY
Glucose-Capillary: 148 mg/dL — ABNORMAL HIGH (ref 70–99)
Glucose-Capillary: 149 mg/dL — ABNORMAL HIGH (ref 70–99)
Glucose-Capillary: 131 mg/dL — ABNORMAL HIGH (ref 70–99)
Glucose-Capillary: 181 mg/dL — ABNORMAL HIGH (ref 70–99)

## 2022-07-04 LAB — MAGNESIUM: Magnesium: 1.7 mg/dL (ref 1.7–2.4)

## 2022-07-04 LAB — PHOSPHORUS: Phosphorus: 2.4 mg/dL — ABNORMAL LOW (ref 2.5–4.6)

## 2022-07-04 LAB — CK: Total CK: 594 U/L — ABNORMAL HIGH (ref 49–397)

## 2022-07-04 MED ORDER — SODIUM ZIRCONIUM CYCLOSILICATE 10 G PO PACK
10.0000 g | PACK | Freq: Every day | ORAL | Status: DC
Start: 2022-07-04 — End: 2022-07-05
  Administered 2022-07-04 – 2022-07-05 (×2): 10 g via ORAL
  Filled 2022-07-04 (×2): qty 1

## 2022-07-04 MED ORDER — SODIUM PHOSPHATES 45 MMOLE/15ML IV SOLN
15.0000 mmol | Freq: Once | INTRAVENOUS | Status: AC
  Administered 2022-07-04: 15 mmol via INTRAVENOUS
  Filled 2022-07-04: qty 5

## 2022-07-04 MED ORDER — HYDROXYZINE HCL 10 MG PO TABS
10.0000 mg | ORAL_TABLET | Freq: Three times a day (TID) | ORAL | Status: DC | PRN
Start: 2022-07-04 — End: 2022-07-14
  Administered 2022-07-04 – 2022-07-08 (×2): 10 mg via ORAL
  Filled 2022-07-04 (×4): qty 1

## 2022-07-04 NOTE — Progress Notes (Signed)
PT Cancellation Note  Patient Details Name: Joseph [name] MRN: [id number] DOB: 03-Oct-1971   Cancelled Treatment:    Reason Eval/Treat Not Completed: Other (comment). Pt with large heel ulcer on the right (hx of L BKA). Awaiting Orthopedic consult to determine treatment and provide WB'ing orders. "PT/OT consult after surgical evaluation" per Hospitalist's note from today (7/16). Will hold until further clarification regarding pt's R LE status.    [name] 07/04/2022, 1:43 PM

## 2022-07-04 NOTE — Progress Notes (Signed)
PROGRESS NOTE  Joseph [name] ZOX:[id number] DOB: 08-29-1971   PCP: [name], NP  Patient is from: Home.  Lives with his wife.  Walker and wheelchair at baseline.  DOA: 06/30/2022 LOS: 3  Chief complaints Chief Complaint  Patient presents with   Fatigue     Brief Narrative / Interim history: 51 year old M with PMH of CAD/CABG/stents, diastolic CHF, PE not on AC, COPD, OSA not on CPAP or oxygen, IDDM-2, PAD, chronic right heel ulcer with early osteomyelitis, left BKA, morbid obesity, anxiety, depression and chronic pain presenting with altered mental status headache, nausea with increased lethargy, confusion, shortness of breath, edema and poor p.o. intake, and admitted for acute metabolic encephalopathy in the setting of acute respiratory failure with hypercarbia and diabetic right foot wound infection and osteomyelitis.  Reportedly, he also had a fall the morning of presentation.    In ED, he was hypotensive to 81/57.  Other vital stable.  K6.2. Cr 1.93 (baseline 1.5).  BUN 44.  Troponin 55>> 67.  Lactic acid 2.2>> 1.2.  WBC 10.5.  Hgb 10.0.  EtOH level negative.  CXR showed left basilar atelectasis.  Right foot x-ray shows increased soft tissue swelling and large heel ulcer.  ABG 7.16/70/47/25.  Cultures obtained.  Started on BiPAP, IV fluids, broad-spectrum antibiotics and Lokelma, and admitted.  The next day, encephalopathy resolved.  Weaned off BiPAP.  Orthopedic surgery consulted, and will evaluate patient on Monday.  Holding Plavix in case Dr. Sharol Given want to proceed with surgery  Subjective: Seen and examined earlier this morning.  He denies any complaints, no significant events overnight, pain is controlled.   Objective: Vitals:   07/03/22 0837 07/03/22 1958 07/03/22 2344 07/04/22 0333  BP:  (!) 136/93 126/86 120/77  Pulse:  78 78 74  Resp:  _0 Temp:  98.2 F (36.8 C) 98.2 F (36.8 C) 97.8 F (36.6 C)  TempSrc:  Oral Oral Oral  SpO2: 98% 99% 97% 99%   Weight:      Height:        Examination:  Awake Alert, Oriented X 3, No new F.N deficits, Normal affect Symmetrical Chest wall movement, Good air movement bilaterally, CTAB RRR,No Gallops,Rubs or new Murmurs, No Parasternal Heave +ve B.Sounds, Abd Soft, No tenderness, No rebound - guarding or rigidity. No Cyanosis, Clubbing, has left BKA, right heel ulcer bandaged.   Procedures:  None  Microbiology summarized: Blood cultures NGTD. Urine culture with insignificant growth.  Assessment and plan: Principal Problem:   Acute metabolic encephalopathy Active Problems:   Hyperlipidemia LDL goal <70   CAD (coronary artery disease)   History of pulmonary embolus (PE)   OSA (obstructive sleep apnea)   Type 2 diabetes mellitus with right diabetic foot infection (HCC)   Anxiety and depression   COPD (chronic obstructive pulmonary disease) (HCC)   Obesity, Class III, BMI 40-49.9 (morbid obesity) (HCC)   Chronic pain   Acute renal failure superimposed on stage 3a chronic kidney disease (HCC)   GERD (gastroesophageal reflux disease)   (HFpEF) heart failure with preserved ejection fraction (HCC)   S/P CABG x 4   PAD (peripheral artery disease) (HCC)   Essential hypertension   DM (diabetes mellitus) with peripheral vascular complication (HCC)   Foot osteomyelitis, right (HCC)   Hx of BKA, left ([place])   Fall at home, initial encounter  Acute metabolic encephalopathy: Likely due to hypercarbia and polypharmacy.  Resolved. -Treat treatable causes -Minimize sedating medications -Reorientation and delirium precautions  Diabetic right  foot ulcer infection and possible early osteomyelitis: Patient with chronic right heel ulcer.  He is followed by Dr. Sharol Given.  MRI on 7/6 with mild linear subperiosteal marrow edema about the posterior aspect of the calcaneus with adjacent ulcer concerning for osteomyelitis.  He is on doxycycline outpatient. ABI in 12/2021 was basically normal.  He has no fever or  leukocytosis.  Blood cultures NGTD.  CRP and ESR elevated.  Sepsis ruled out. -Appreciate input by orthopedic surgery -Plavix on hold.  Last dose the morning of 7/13. -Continue broad-spectrum antibiotics  Acute respiratory failure with hypercarbia/chronic COPD/OSA/possible OHS not on CPAP after he lost his home: Resolved with BiPAP.  On room air.   -BiPAP as needed and at night-not compliant at night. -Minimum oxygen to keep saturation above 90% if needed -Minimize or avoid sedating medications -Continue home inhalers.  Hyperkalemia -Continue with Lokelma  Hypophosphatemia -Repleted  Chronic diastolic CHF: TTE in 12/6107 with LVEF of 55 to 60%, G2 DD and no RWMA   Difficult to assess fluid status due to body habitus but does not look fluid overloaded.  On p.o. Lasix 40 mg daily -Continue holding Lasix in the setting of AKI. -Monitor intake and output, daily weight and renal functions  Elevated troponin/history of CAD/CABG and stents/PAD:  Mildly elevated troponin likely demand ischemia and delayed clearance from AKI.  Patient has no anginal symptoms.  LHC on 06/17/2022 with severe native CAD with 80% distal left main stenosis with widely patent LIMA graft supplying the LAD and SVG supplying the diagonal vessel.  At that time, cardiology recommended medical therapy with DAPT, optimal blood pressure control and statin. -Continue coreg, aspirin and Ranexa -Hold Plavix in case of surgery(no PCI over last 12 months) -  Hold statin due to elevated CK.  Uncontrolled IDDM-2 with hypoglycemia, CKD-3A, foot ulcer, neuropathy and PAD: A1c 7.1% on 4/27. Recent Labs  Lab 07/03/22 0628 07/03/22 1216 07/03/22 1756 07/03/22 2115 07/04/22 0606  GLUCAP 118* 148* 234* 164* 148*  -Continue SSI-moderate -Continue NovoLog 3 units 3 times daily with meals -Monitor CBG  AKI on CKD-3A: Multifactorial-including hypotension, diuretics, rhabdo.  AKI resolved. Recent Labs    04/15/22 0611 04/16/22 0420  06/15/22 1140 06/30/22 1318 06/30/22 2318 07/01/22 0259 07/01/22 1451 07/02/22 0225 07/03/22 0325 07/04/22 0146  BUN 24* 26* 36* 44* 48* 47* 48* 50* 36* 28*  CREATININE 1.75* 1.53* 1.44* 1.93* 2.54* 2.57* 2.46* 2.22* 1.44* 1.34*  -Continue IV fluid -Avoid nephrotoxic meds -Renally dose medications  Anemia of chronic disease: Drop in Hgb likely dilutional.  Anemia panel with some degree of iron deficiency Recent Labs    06/17/22 1006 06/17/22 1012 06/30/22 1318 06/30/22 1658 06/30/22 1938 07/01/22 0259 07/01/22 0810 07/02/22 0225 07/03/22 0325 07/04/22 0146  HGB 10.9*  10.9* 10.9* 10.0* 10.5* 9.5* 9.0* 9.5* 8.6* 9.1* 9.5*  -Continue monitoring -IV ferric gluconate x1  hypomagnesemia:  -repleted  Fall at home: No report of head trauma or LOC.  CT head without acute finding. Traumatic rhabdomyolysis: CK elevated to 2412. Physical deconditioning/fatigue/left BKA -PT/OT consult after surgical evaluation -Fall precaution   History of PE-not on anticoagulation at home.  Anxiety/depression/chronic pain: On Xanax, Wellbutrin, Lexapro, oxycodone 15 mg 5 times a day and Lyrica 300 mg in the morning and 150 mg in the evening at home. -Continue home meds for anxiety and depression except Xanax. -Continue decreased dose of Lyrica 75 mg twice daily -Oxycodone 5 mg every 6 hours as needed for moderate pain -Oxycodone 10 mg every 6  hours as needed for severe pain  Morbid obesity Body mass index is 41.5 kg/m. -On Victoza.  Pressure skin injury/ulcer: POA -Orthopedic surgery   DVT prophylaxis:  Change Lovenox to subcu heparin  Code Status: Full code Family Communication: Updated patient's wife on 7/14. Level of care: Med-Surg Status is: Inpatient The patient will remain inpatient because: diabetic right foot ulcer infection and early osteomyelitis, and AKI   Final disposition: TBD Consultants:  Orthopedic surgery  Sch Meds:  Scheduled Meds:  aspirin EC  81 mg  Oral Daily   buPROPion  150 mg Oral Daily   carvedilol  3.125 mg Oral BID   enoxaparin (LOVENOX) injection  80 mg Subcutaneous Q24H   escitalopram  10 mg Oral q AM   icosapent Ethyl  2 g Oral BID   insulin aspart  0-15 Units Subcutaneous TID WC   insulin aspart  0-5 Units Subcutaneous QHS   insulin aspart  3 Units Subcutaneous TID WC   insulin glargine-yfgn  25 Units Subcutaneous BID   pantoprazole  40 mg Oral Daily   pregabalin  75 mg Oral BID   ranolazine  500 mg Oral BID   senna  2 tablet Oral Daily   sodium chloride flush  3 mL Intravenous Q12H   sodium zirconium cyclosilicate  10 g Oral Daily   Continuous Infusions:  sodium chloride 100 mL/hr at 07/03/22 1900   ceFEPime (MAXIPIME) IV 2 g (07/04/22 6294)   metronidazole Stopped (07/04/22 0736)   sodium phosphate 15 mmol in dextrose 5 % 250 mL infusion 15 mmol (07/04/22 0657)   vancomycin Stopped (07/04/22 0736)   PRN Meds:.acetaminophen **OR** acetaminophen, albuterol, naloxone, ondansetron **OR** ondansetron (ZOFRAN) IV, mouth rinse, oxyCODONE  Antimicrobials: Anti-infectives (From admission, onward)    Start     Dose/Rate Route Frequency Ordered Stop   07/01/22 1800  vancomycin (VANCOREADY) IVPB 1500 mg/300 mL        1,500 mg 150 mL/hr over 120 Minutes Intravenous Every 24 hours 07/01/22 1122     06/30/22 2200  doxycycline (VIBRA-TABS) tablet 100 mg  Status:  Discontinued        100 mg Oral 2 times daily 06/30/22 1924 06/30/22 1946   06/30/22 2100  metroNIDAZOLE (FLAGYL) IVPB 500 mg        500 mg 100 mL/hr over 60 Minutes Intravenous Every 12 hours 06/30/22 2034     06/30/22 2042  vancomycin variable dose per unstable renal function (pharmacist dosing)  Status:  Discontinued         Does not apply See admin instructions 06/30/22 2042 07/01/22 1122   06/30/22 1700  vancomycin (VANCOCIN) 2,500 mg in sodium chloride 0.9 % 500 mL IVPB        2,500 mg 262.5 mL/hr over 120 Minutes Intravenous  Once 06/30/22 1645 06/30/22  2005   06/30/22 1700  ceFEPIme (MAXIPIME) 2 g in sodium chloride 0.9 % 100 mL IVPB        2 g 200 mL/hr over 30 Minutes Intravenous Every 8 hours 06/30/22 1646          I have personally reviewed the following labs and images: CBC: Recent Labs  Lab 06/30/22 1318 06/30/22 1658 07/01/22 0259 07/01/22 0810 07/02/22 0225 07/03/22 0325 07/04/22 0146  WBC 10.5  --  9.7  --  7.5 6.5 7.7  NEUTROABS 7.7  --   --   --   --   --   --   HGB 10.0*   < > 9.0* 9.5*  8.6* 9.1* 9.5*  HCT 33.8*   < > 31.7* 28.0* 28.4* 29.7* 31.2*  MCV 80.1  --  84.1  --  79.8* 78.6* 78.0*  PLT 290  --  241  --  237 246 254   < > = values in this interval not displayed.   BMP &GFR Recent Labs  Lab 07/01/22 0259 07/01/22 0810 07/01/22 1451 07/02/22 0225 07/03/22 0325 07/04/22 0146  NA 140 141 138 137 139 141  K 5.3* 5.8* 6.2* 5.3* 5.5* 5.3*  CL 110  --  106 104 111 110  CO2 22  --  _0 20*  GLUCOSE 90  --  139* 231* 177* 151*  BUN 47*  --  48* 50* 36* 28*  CREATININE 2.57*  --  2.46* 2.22* 1.44* 1.34*  CALCIUM 7.8*  --  8.4* 7.9* 8.2* 8.6*  MG  --   --  1.5* 1.3* 1.6* 1.7  PHOS  --   --  4.3 3.5 2.5 2.4*   Estimated Creatinine Clearance: 110.4 mL/min (A) (by C-G formula based on SCr of 1.34 mg/dL (H)). Liver & Pancreas: Recent Labs  Lab 06/30/22 1318 07/01/22 0259 07/01/22 1451 07/02/22 0225 07/03/22 0325 07/04/22 0146  AST 24 16  --   --   --  28  ALT 17 14  --   --   --  20  ALKPHOS 107 81  --   --   --  73  BILITOT 0.5 0.5  --   --   --  0.2*  PROT 6.5 5.9*  --   --   --  5.8*  ALBUMIN 2.4* 2.1* 2.3* 2.0* 2.1* 2.2*   No results for input(s): "LIPASE", "AMYLASE" in the last 168 hours. Recent Labs  Lab 07/01/22 0800  AMMONIA 14   Diabetic: Recent Labs    07/03/22 0325  HGBA1C 7.2*   Recent Labs  Lab 07/03/22 0628 07/03/22 1216 07/03/22 1756 07/03/22 2115 07/04/22 0606  GLUCAP 118* 148* 234* 164* 148*   Cardiac Enzymes: Recent Labs  Lab 07/01/22 0800  07/02/22 0225 07/04/22 0146  CKTOTAL 367 2,412* 594*   No results for input(s): "PROBNP" in the last 8760 hours. Coagulation Profile: No results for input(s): "INR", "PROTIME" in the last 168 hours. Thyroid Function Tests: No results for input(s): "TSH", "T4TOTAL", "FREET4", "T3FREE", "THYROIDAB" in the last 72 hours.  Lipid Profile: No results for input(s): "CHOL", "HDL", "LDLCALC", "TRIG", "CHOLHDL", "LDLDIRECT" in the last 72 hours. Anemia Panel: Recent Labs    07/03/22 0325  VITAMINB12 1,654*  FOLATE 9.9  FERRITIN 43  TIBC 252  IRON 31*  RETICCTPCT 2.2   Urine analysis:    Component Value Date/Time   COLORURINE AMBER (A) 06/30/2022 1206   APPEARANCEUR HAZY (A) 06/30/2022 1206   LABSPEC 1.024 06/30/2022 1206   PHURINE 5.0 06/30/2022 1206   GLUCOSEU NEGATIVE 06/30/2022 1206   HGBUR NEGATIVE 06/30/2022 1206   BILIRUBINUR NEGATIVE 06/30/2022 1206   KETONESUR NEGATIVE 06/30/2022 1206   PROTEINUR 100 (A) 06/30/2022 1206   UROBILINOGEN 0.2 10/27/2007 1550   NITRITE NEGATIVE 06/30/2022 1206   LEUKOCYTESUR NEGATIVE 06/30/2022 1206   Sepsis Labs: Invalid input(s): "PROCALCITONIN", "LACTICIDVEN"  Microbiology: Recent Results (from the past 240 hour(s))  Blood culture (routine x 2)     Status: None (Preliminary result)   Collection Time: 06/30/22  1:18 PM   Specimen: BLOOD  Result Value Ref Range Status   Specimen Description BLOOD SITE NOT SPECIFIED  Final   Special Requests  Final    BOTTLES DRAWN AEROBIC AND ANAEROBIC Blood Culture results may not be optimal due to an inadequate volume of blood received in culture bottles   Culture   Final    NO GROWTH 4 DAYS Performed at [facility], [place] [address], [place], [place] [address]    Report Status PENDING  Incomplete  Blood culture (routine x 2)     Status: None (Preliminary result)   Collection Time: 06/30/22  4:32 PM   Specimen: BLOOD  Result Value Ref Range Status   Specimen Description BLOOD RIGHT  ANTECUBITAL  Final   Special Requests   Final    BOTTLES DRAWN AEROBIC AND ANAEROBIC Blood Culture results may not be optimal due to an excessive volume of blood received in culture bottles   Culture   Final    NO GROWTH 4 DAYS Performed at [facility], [place] [address] [place], [place] [address]    Report Status PENDING  Incomplete  Urine Culture     Status: Abnormal   Collection Time: 07/01/22 12:06 PM   Specimen: Urine, Clean Catch  Result Value Ref Range Status   Specimen Description URINE, CLEAN CATCH  Final   Special Requests NONE  Final   Culture (A)  Final    <10,000 COLONIES/mL INSIGNIFICANT GROWTH Performed at [facility], [place] [address] [place], [place] [address]    Report Status 07/02/2022 FINAL  Final    Radiology Studies: No results found.    Phillips Climes MD [facility]  If 7PM-7AM, please contact night-coverage [email or web address] 07/04/2022, 9:25 AM

## 2022-07-04 NOTE — Progress Notes (Signed)
Bipap order discontinued at this time. Pt has not worn since 07/01/22.

## 2022-07-05 DIAGNOSIS — I96 Gangrene, not elsewhere classified: Secondary | ICD-10-CM | POA: Diagnosis not present

## 2022-07-05 DIAGNOSIS — E1151 Type 2 diabetes mellitus with diabetic peripheral angiopathy without gangrene: Secondary | ICD-10-CM | POA: Diagnosis not present

## 2022-07-05 DIAGNOSIS — M86171 Other acute osteomyelitis, right ankle and foot: Secondary | ICD-10-CM

## 2022-07-05 DIAGNOSIS — G9341 Metabolic encephalopathy: Secondary | ICD-10-CM | POA: Diagnosis not present

## 2022-07-05 LAB — BASIC METABOLIC PANEL
Anion gap: 4 — ABNORMAL LOW (ref 5–15)
BUN: 19 mg/dL (ref 6–20)
CO2: 23 mmol/L (ref 22–32)
Calcium: 8.8 mg/dL — ABNORMAL LOW (ref 8.9–10.3)
Chloride: 111 mmol/L (ref 98–111)
Creatinine, Ser: 1.14 mg/dL (ref 0.61–1.24)
GFR, Estimated: >60 mL/min (ref >60)
Glucose, Bld: 152 mg/dL — ABNORMAL HIGH (ref 70–99)
Potassium: 4.9 mmol/L (ref 3.5–5.1)
Sodium: 138 mmol/L (ref 135–145)

## 2022-07-05 LAB — CBC
HCT: 32 % — ABNORMAL LOW (ref 39.0–52.0)
Hemoglobin: 9.7 g/dL — ABNORMAL LOW (ref 13.0–17.0)
MCH: 24.1 pg — ABNORMAL LOW (ref 26.0–34.0)
MCHC: 30.3 g/dL (ref 30.0–36.0)
MCV: 79.4 fL — ABNORMAL LOW (ref 80.0–100.0)
Platelets: 235 10*3/uL (ref 150–400)
RBC: 4.03 MIL/uL — ABNORMAL LOW (ref 4.22–5.81)
RDW: 17.8 % — ABNORMAL HIGH (ref 11.5–15.5)
WBC: 8.5 10*3/uL (ref 4.0–10.5)
nRBC: 0 % (ref 0.0–0.2)

## 2022-07-05 LAB — CULTURE, BLOOD (ROUTINE X 2)
Culture: NO GROWTH
Culture: NO GROWTH

## 2022-07-05 LAB — GLUCOSE, CAPILLARY
Glucose-Capillary: 134 mg/dL — ABNORMAL HIGH (ref 70–99)
Glucose-Capillary: 137 mg/dL — ABNORMAL HIGH (ref 70–99)
Glucose-Capillary: 168 mg/dL — ABNORMAL HIGH (ref 70–99)
Glucose-Capillary: 166 mg/dL — ABNORMAL HIGH (ref 70–99)

## 2022-07-05 NOTE — Progress Notes (Signed)
Physical Therapy Evaluation Patient Details Name: Joseph [name] MRN: [id number] DOB: [birth year] Today's Date: 07/05/2022  History of Present Illness  51 y.o. male Admitted 7/12 for acute metabolic encephalopathy in the setting of acute respiratory failure with hypercarbia and diabetic right foot wound infection and osteomyelitis. Marland Kitchen PMH includes CAD, MI, DM, L BKA, chronic R heel ulcer, COPD, anxiety, depression.  Clinical Impression  Pt admitted with above diagnosis. Noted plan for Rt transtibial amputation on Wed. Patient has LLE prosthesis. Impulsive, instructed on NWB through RLE until clarified by surgeon. Limited evaluation due to patient becoming nauseous. Anticipate great difficulty standing and transferring on LLE prosthesis post-op RLE BKA. Lives with family who can provide 24/7 assist. Patient likely to have excellent outcomes with AIR post-op to regain functional independence. Will update post-op pending ortho recommendations/order. Pt will benefit from skilled PT to increase their independence and safety with mobility to allow discharge to the venue listed below.          Recommendations for follow up therapy are one component of a multi-disciplinary discharge planning process, led by the attending physician.  Recommendations may be updated based on patient status, additional functional criteria and insurance authorization.  Follow Up Recommendations Acute inpatient rehab (3hours/day) (Anticipated pending abilities post-op)      Assistance Recommended at Discharge Intermittent Supervision/Assistance  Patient can return home with the following  Two people to help with walking and/or transfers;A lot of help with bathing/dressing/bathroom;Assistance with cooking/housework;Assist for transportation;Help with stairs or ramp for entrance    Equipment Recommendations None recommended by PT  Recommendations for Other Services  Rehab consult    Functional Status Assessment Patient has  had a recent decline in their functional status and demonstrates the ability to make significant improvements in function in a reasonable and predictable amount of time.     Precautions / Restrictions Precautions Precautions: Fall Required Braces or Orthoses: Other Brace Other Brace: Wears BKA prosthesis for LLE. Restrictions Weight Bearing Restrictions:  (Per ortho) Other Position/Activity Restrictions: (awaiting order)      Mobility  Bed Mobility Overal bed mobility: Modified Independent             General bed mobility comments: No physical assist, used rail    Transfers Overall transfer level: Needs assistance Equipment used: None Transfers: Sit to/from Stand Sit to Stand: Min guard           General transfer comment: Limited assessment. Pt impulsive and became nauseous. While attempting to donne LLE prosthesis pt stood up on RLE (after just discussing NWB until clarified by ortho.) Had pt sit back down and reviewed further but declined additional stand or transfer with prosthesis due to nausea.    Ambulation/Gait                  Stairs            Wheelchair Mobility    Modified Rankin (Stroke Patients Only)       Balance Overall balance assessment: Mild deficits observed, not formally tested                                           Pertinent Vitals/Pain Pain Assessment Pain Assessment: 0-10 Pain Score: 7  Pain Location: bil knees, Rt heel, knees, and shoulders Pain Descriptors / Indicators: Aching Pain Intervention(s): Limited activity within patient's tolerance, Monitored during session, Repositioned  Home Living Family/patient expects to be discharged to:: Inpatient rehab Living Arrangements: Spouse/significant other;Children;Other (Comment) Available Help at Discharge: Family;Available 24 hours/day Type of Home: House Home Access: Ramped entrance       Home Layout: One level Home Equipment: Shower  seat;Cane - single point;Rolling Walker (2 wheels);Wheelchair - manual (Has BKA prosthesis for LLE) Additional Comments: Staying with an aunt/uncle.    Prior Function Prior Level of Function : Needs assist             Mobility Comments: pt reports he hops from bed to wheelchair <10' and then from bathroom door to commode. Also mentions going out into the community some and using w/c. ADLs Comments: takes a sponge bath     Hand Dominance   Dominant Hand: Right    Extremity/Trunk Assessment   Upper Extremity Assessment Upper Extremity Assessment: Defer to OT evaluation    Lower Extremity Assessment Lower Extremity Assessment: RLE deficits/detail;LLE deficits/detail RLE Deficits / Details: Heel wrapped with bandaging. LLE Deficits / Details: Hx of L BKA - wears prosthesis.       Communication   Communication: No difficulties  Cognition Arousal/Alertness: Awake/alert Behavior During Therapy: WFL for tasks assessed/performed Overall Cognitive Status: No family/caregiver present to determine baseline cognitive functioning                                 General Comments: Oriented but impulsive, neglects precautions we discussed.        General Comments General comments (skin integrity, edema, etc.): Instructed NWB RLE until clarified by orthopedic surgeon.    Exercises     Assessment/Plan    PT Assessment Patient needs continued PT services  PT Problem List Decreased strength;Decreased activity tolerance;Decreased balance;Decreased mobility;Decreased safety awareness;Decreased knowledge of precautions;Decreased knowledge of use of DME;Obesity;Impaired sensation       PT Treatment Interventions DME instruction;Gait training;Functional mobility training;Therapeutic activities;Therapeutic exercise;Balance training;Neuromuscular re-education;Patient/family education    PT Goals (Current goals can be found in the Care Plan section)  Acute Rehab PT  Goals Patient Stated Goal: Get rehab after surgery. PT Goal Formulation: With patient Time For Goal Achievement: 07/19/22 Potential to Achieve Goals: Good    Frequency Min 4X/week     Co-evaluation               AM-PAC PT "6 Clicks" Mobility  Outcome Measure Help needed turning from your back to your side while in a flat bed without using bedrails?: None Help needed moving from lying on your back to sitting on the side of a flat bed without using bedrails?: None Help needed moving to and from a bed to a chair (including a wheelchair)?: A Lot Help needed standing up from a chair using your arms (e.g., wheelchair or bedside chair)?: A Lot Help needed to walk in hospital room?: A Lot Help needed climbing 3-5 steps with a railing? : Total 6 Click Score: 15    End of Session Equipment Utilized During Treatment: Gait belt Activity Tolerance: Other (comment) (Treatment limited 2/2 nausea) Patient left: in bed;with call bell/phone within reach;with bed alarm set   PT Visit Diagnosis: Unsteadiness on feet (R26.81);History of falling (Z91.81);Difficulty in walking, not elsewhere classified (R26.2);Pain Pain - Right/Left: Right Pain - part of body: Ankle and joints of foot    Time: 0921-0942 PT Time Calculation (min) (ACUTE ONLY): 21 min   Charges:   PT Evaluation $PT Eval Low Complexity: 1 Low  Kathlyn Sacramento, PT   [place] 07/05/2022, 10:19 AM

## 2022-07-05 NOTE — Progress Notes (Signed)
  Inpatient Rehabilitation Admissions Coordinator   Per therapy recommendations patient was screened for CIR candidacy by Ottie Glazier RN MSN. Current payor trends with [facility] Medicare are unlikley to approve a CIR/AIR level rehab for amputations. I will not place a Rehab Conuslt at this time. Recommend other rehab venues to be pursued.   Ottie Glazier, RN, MSN Rehab Admissions Coordinator [phone] 07/05/2022 11:01 AM

## 2022-07-05 NOTE — Consult Note (Signed)
ORTHOPAEDIC CONSULTATION  REQUESTING PHYSICIAN: Elgergawy, Leana Roe, MD  Chief Complaint: Necrotic ulcer right heel.  HPI: Joseph [name] is a 51 y.o. male who presents with necrotic ulcer right heel with osteomyelitis.  Patient is status post left transtibial amputation has ambulated well with his prosthesis.  Patient also has a history of coronary artery disease, with type 2 diabetes and severe protein caloric malnutrition.  Past Medical History:  Diagnosis Date   Aneurysm of ascending aorta (HCC) 09/14/2021   Anxiety    Arthritis    "knees, shoulders, hips, ankles" (07/29/2016)   Asthma    Bicuspid aortic valve 09/14/2021   CAD (coronary artery disease)    a. 2017: s/p BMS to distal Cx; b. LHC 05/30/2019: 80% mid, distal RCA s/p DES, 30% narrowing of d LM, widely patent LAD w/ luminal irregularities, widely patent stent in dCX  w/ 90+% stenosis distal to stent beofre small trifurcating obtuse marginal (potentially area of restenosis)   Chronic bronchitis (HCC)    Chronic ulcer of great toe of right foot (HCC) 09/09/2017   Chronic ulcer of right great toe (HCC) 09/19/2017   COPD (chronic obstructive pulmonary disease) (HCC)    Depression    DVT (deep venous thrombosis) (HCC)    GERD (gastroesophageal reflux disease)    Hyperlipidemia    Hypertension    Myocardial infarction (HCC) 1996   "light one"   PE (pulmonary embolism) 04/2013   On chronic Xarelto   Peripheral nerve disease    Pneumonia "several times"   Sleep apnea    Type II diabetes mellitus (HCC)    Past Surgical History:  Procedure Laterality Date   ABDOMINAL AORTOGRAM W/LOWER EXTREMITY N/A 10/10/2020   Procedure: ABDOMINAL AORTOGRAM W/LOWER EXTREMITY;  Surgeon: Sherren Kerns, MD;  Location: [facility];  Service: Cardiovascular;  Laterality: N/A;   AMPUTATION Right 09/21/2017   Procedure: RIGHT GREAT TOE AMPUTATION, POSSIBLE VAC;  Surgeon: Tarry Kos, MD;  Location: [facility] OR;  Service: Orthopedics;   Laterality: Right;   AMPUTATION Left 08/13/2020   Procedure: LEFT FOOT 4TH RAY AMPUTATION;  Surgeon: [name], MD;  Location: [facility] OR;  Service: Orthopedics;  Laterality: Left;   AMPUTATION Left 11/20/2021   Procedure: LEFT TRANSMETATARSAL AMPUTATION;  Surgeon: [name], MD;  Location: [facility] OR;  Service: Orthopedics;  Laterality: Left;   AMPUTATION Left 01/08/2022   Procedure: LEFT BELOW KNEE AMPUTATION;  Surgeon: [name], MD;  Location: [facility] OR;  Service: Orthopedics;  Laterality: Left;   AORTOGRAM Bilateral 03/13/2021   Procedure: ABDOMINAL AORTOGRAM WITH Left LOWER EXTREMITY RUNOFF;  Surgeon: Leonie Douglas, MD;  Location: [facility] OR;  Service: Vascular;  Laterality: Bilateral;   APPLICATION OF WOUND VAC  01/08/2022   Procedure: APPLICATION OF WOUND VAC;  Surgeon: [name], MD;  Location: [facility] OR;  Service: Orthopedics;;   CARDIAC CATHETERIZATION  2006   CARDIAC CATHETERIZATION  1996   "@ [facility]; when I had my heart attack"   CARDIAC CATHETERIZATION N/A 07/29/2016   Procedure: Left Heart Cath and Coronary Angiography;  Surgeon: Runell Gess, MD;  Location: [facility];  Service: Cardiovascular;  Laterality: N/A;   CARDIAC CATHETERIZATION N/A 07/29/2016   Procedure: Coronary Stent Intervention;  Surgeon: Runell Gess, MD;  Location: [facility];  Service: Cardiovascular;  Laterality: N/A;   CARPAL TUNNEL RELEASE Bilateral    CORONARY ANGIOPLASTY WITH STENT PLACEMENT  07/29/2016   CORONARY ARTERY BYPASS GRAFT N/A 07/24/2020  Procedure: CORONARY ARTERY BYPASS GRAFTING (CABG), ON PUMP, TIMES FOUR, USING LEFT INTERNAL MAMMARY ARTERY AND ENDOSCOPICALLY HARVESTED RIGHT GREATER SAPHENOUS VEIN;  Surgeon: Lightfoot, Harrell O, MD;  Location: [facility] OR;  Service: Open Heart Surgery;  Laterality: N/A;  FLOW TAC   CORONARY STENT INTERVENTION N/A 05/30/2019   Procedure: CORONARY STENT INTERVENTION;  Surgeon: Smith, Henry W, MD;  [facility]: [facility];  Service: Cardiovascular;   Laterality: N/A;   CORONARY STENT INTERVENTION N/A 11/02/2019   Procedure: CORONARY STENT INTERVENTION;  Surgeon: Arida, Muhammad A, MD;  [facility]: [facility];  Service: Cardiovascular;  Laterality: N/A;   ESOPHAGOGASTRODUODENOSCOPY N/A 09/22/2017   Procedure: ESOPHAGOGASTRODUODENOSCOPY (EGD);  Surgeon: Stark, Malcolm T, MD;  [facility]: [facility];  Service: Endoscopy;  Laterality: N/A;   INTRAVASCULAR PRESSURE WIRE/FFR STUDY N/A 07/22/2020   Procedure: INTRAVASCULAR PRESSURE WIRE/FFR STUDY;  Surgeon: Harding, David W, MD;  [facility]: [facility];  Service: Cardiovascular;  Laterality: N/A;   KNEE ARTHROSCOPY Bilateral    "2 on left; 1 on the right"   LEFT HEART CATH AND CORONARY ANGIOGRAPHY N/A 11/02/2019   Procedure: LEFT HEART CATH AND CORONARY ANGIOGRAPHY;  Surgeon: Arida, Muhammad A, MD;  [facility];  Service: Cardiovascular;  Laterality: N/A;   LEFT HEART CATH AND CORONARY ANGIOGRAPHY N/A 07/22/2020   Procedure: LEFT HEART CATH AND CORONARY ANGIOGRAPHY;  Surgeon: Harding, David W, MD;  [facility];  Service: Cardiovascular;  Laterality: N/A;   LEFT HEART CATH AND CORS/GRAFTS ANGIOGRAPHY N/A 08/17/2021   Procedure: LEFT HEART CATH AND CORS/GRAFTS ANGIOGRAPHY;  Surgeon: End, Christopher, MD;  [facility]: [facility];  Service: Cardiovascular;  Laterality: N/A;   PERIPHERAL VASCULAR INTERVENTION Left 10/11/2020   popliteal and SFA stent placement    PERIPHERAL VASCULAR INTERVENTION Left 10/10/2020   Procedure: PERIPHERAL VASCULAR INTERVENTION;  Surgeon: Fields, Charles E, MD;  [facility];  Service: Cardiovascular;  Laterality: Left;   RIGHT/LEFT HEART CATH AND CORONARY ANGIOGRAPHY N/A 05/30/2019   Procedure: RIGHT/LEFT HEART CATH AND CORONARY ANGIOGRAPHY;  Surgeon: Smith, Henry W, MD;  [facility];  Service: Cardiovascular;  Laterality: N/A;   RIGHT/LEFT HEART CATH AND CORONARY/GRAFT ANGIOGRAPHY N/A 06/17/2022    Procedure: RIGHT/LEFT HEART CATH AND CORONARY/GRAFT ANGIOGRAPHY;  Surgeon: Kelly, Thomas A, MD;  [facility]: [facility];  Service: Cardiovascular;  Laterality: N/A;   SHOULDER OPEN ROTATOR CUFF REPAIR Bilateral    TEE WITHOUT CARDIOVERSION N/A 07/24/2020   Procedure: TRANSESOPHAGEAL ECHOCARDIOGRAM (TEE);  Surgeon: Lightfoot, Harrell O, MD;  Location: [facility] OR;  Service: Open Heart Surgery;  Laterality: N/A;   Social History   Socioeconomic History   Marital status: Married    Spouse name: Not on file   Number of children: Not on file   Years of education: Not on file   Highest education level: Not on file  Occupational History   Occupation: disabled     Employer: UNEMPLOYED  Tobacco Use   Smoking status: Former    Packs/day: 1.00    Years: 34.00    Total pack years: 34.00    Types: Cigarettes    Quit date: 07/12/2020    Years since quitting: 1.9   Smokeless tobacco: Former    Types: Snuff, Chew  Vaping Use   Vaping Use: Never used  Substance and Sexual Activity   Alcohol use: Yes    Comment: RARE   Drug use: No   Sexual activity: Yes  Other Topics   Concern   Not on file  Social History Narrative   Not on file   Social Determinants of Health   Financial Resource Strain: Not on file  Food Insecurity: Not on file  Transportation Needs: Not on file  Physical Activity: Not on file  Stress: Not on file  Social Connections: Not on file   Family History  Problem Relation Age of Onset   Hypertension Brother    Hypertension Father    Diabetes Other    Hyperlipidemia Other    - negative except otherwise stated in the family history section Allergies  Allergen Reactions   Sulfa Antibiotics Anaphylaxis and Other (See Comments)    Headaches    Prior to Admission medications   Medication Sig Start Date End Date Taking? Authorizing Provider  acetaminophen (TYLENOL) 500 MG tablet Take 1,000 mg by mouth 3 (three) times daily as needed for headache.   Yes [provider]  albuterol (VENTOLIN HFA) 108 (90 Base) MCG/ACT inhaler Inhale 2 puffs into the lungs every 6 (six) hours.   Yes [provider]  ALPRAZolam Prudy Feeler) 1 MG tablet Take 2 mg by mouth at bedtime. 03/24/22  Yes [provider]  aspirin EC 81 MG tablet Take 81 mg by mouth in the morning. Swallow whole.   Yes [provider]  atorvastatin (LIPITOR) 80 MG tablet Take 1 tablet (80 mg total) by mouth daily at 6 PM. 05/31/19  Yes Marjie Skiff E, PA-C  buPROPion (WELLBUTRIN XL) 150 MG 24 hr tablet Take 150 mg by mouth in the morning and at bedtime.   Yes [provider]  calcium carbonate (TUMS EXTRA STRENGTH 750) 750 MG chewable tablet Chew 1,500 mg by mouth as needed (stomach pain).   Yes [provider]  carvedilol (COREG) 3.125 MG tablet Take 3.125 mg by mouth 2 (two) times daily. 02/22/22  Yes [provider]  clopidogrel (PLAVIX) 75 MG tablet Take 1 tablet (75 mg total) by mouth daily with breakfast. 10/11/20  Yes Lars Mage, PA-C  doxycycline (VIBRA-TABS) 100 MG tablet Take 1 tablet (100 mg total) by mouth 2 (two) times daily. 06/09/22  Yes [name] R, NP  escitalopram (LEXAPRO) 10 MG tablet Take 10 mg by mouth in the morning. 01/27/22  Yes [provider]  fluconazole (DIFLUCAN) 100 MG tablet Take 100 mg by mouth every Monday. 03/08/22  Yes [provider]  fluticasone (FLONASE) 50 MCG/ACT nasal spray Place 2 sprays into both nostrils daily.   Yes [provider]  furosemide (LASIX) 40 MG tablet TAKE 1 TABLET(40 MG) BY MOUTH DAILY Patient taking differently: Take 40 mg by mouth daily. 05/03/22  Yes Jodelle Red, MD  GVOKE HYPOPEN 2-PACK 1 MG/0.2ML SOAJ Inject 1 mg into the skin as needed (low blood sugar). 04/21/22  Yes [provider]  icosapent Ethyl (VASCEPA) 1 g capsule Take 2 capsules (2 g total) by mouth 2 (two) times daily. 09/02/21  Yes Chilton Si, MD  insulin aspart  (NOVOLOG) 100 UNIT/ML FlexPen Inject 6 Units into the skin 3 (three) times daily with meals. Per sliding scale Patient taking differently: Inject 2-36 Units into the skin 3 (three) times daily with meals. 11/25/21  Yes Rhetta Mura, MD  LANTUS SOLOSTAR 100 UNIT/ML Solostar Pen Inject 50 Units into the skin in the morning and at bedtime. 04/11/22  Yes [provider]  liraglutide (VICTOZA) 18 MG/3ML SOPN Inject 1.8 mg into the skin daily. Patient taking differently: Inject 1.2 mg into the  skin every evening. 07/17/21  Yes Shah, Pratik D, DO  loratadine (CLARITIN) 10 MG tablet Take 10 mg by mouth daily. 12/22/21  Yes [provider]  meclizine (ANTIVERT) 25 MG tablet Take 25-50 mg by mouth See admin instructions. Take 50 mg  by mouth in the morning and take 25 mg  by mouth at night. 02/22/22  Yes [provider]  metFORMIN (GLUCOPHAGE) 1000 MG tablet Take 1 tablet (1,000 mg total) by mouth 2 (two) times daily with a meal. 11/05/19  Yes Leone Brand, NP  naloxone [facility]) nasal spray 4 mg/0.1 mL Place 4 mg into the nose as needed (overdose).   Yes [provider]  nitroGLYCERIN (NITROSTAT) 0.4 MG SL tablet Place 0.4 mg under the tongue every 5 (five) minutes x 3 doses as needed for chest pain.   Yes [provider]  oxyCODONE (ROXICODONE) 15 MG immediate release tablet Take 15 mg by mouth 5 (five) times daily. 04/01/22  Yes [provider]  pantoprazole (PROTONIX) 40 MG tablet Take 1 tablet (40 mg total) by mouth daily. Patient taking differently: Take 80 mg by mouth daily before breakfast. 09/23/17  Yes Hongalgi, Maximino Greenland, MD  pregabalin (LYRICA) 150 MG capsule Take 2 capsules (300 mg total) by mouth in the morning. Patient taking differently: Take 150-300 mg by mouth See admin instructions. Take 300 mg by mouth in the morning and take 150 mg  by mouth at night. 11/25/21  Yes Rhetta Mura, MD  promethazine (PHENERGAN) 25 MG tablet Take 25 mg  by mouth every 4 (four) hours as needed for nausea or vomiting. 02/26/22  Yes [provider]  ranolazine (RANEXA) 500 MG 12 hr tablet Take 1 tablet (500 mg total) by mouth 2 (two) times daily. 09/02/21  Yes Chilton Si, MD  mupirocin ointment (BACTROBAN) 2 % Apply 1 Application topically 2 (two) times daily as needed (wound care). Patient not taking: Reported on 07/01/2022    [provider]   No results found. - pertinent xrays, CT, MRI studies were reviewed and independently interpreted  Positive ROS: All other systems have been reviewed and were otherwise negative with the exception of those mentioned in the HPI and as above.  Physical Exam: General: Alert, no acute distress Psychiatric: Patient is competent for consent with normal mood and affect Lymphatic: No axillary or cervical lymphadenopathy Cardiovascular: No pedal edema Respiratory: No cyanosis, no use of accessory musculature GI: No organomegaly, abdomen is soft and non-tender    Images:  @ENCIMAGES @  Labs:  Lab Results  Component Value Date   HGBA1C 7.2 (H) 07/03/2022   HGBA1C 7.1 (H) 04/15/2022   HGBA1C 8.4 (H) 01/07/2022   ESRSEDRATE 62 (H) 07/01/2022   ESRSEDRATE 45 (H) 11/18/2021   ESRSEDRATE 41 (H) 09/16/2017   CRP 12.0 (H) 07/01/2022   CRP 12.1 (H) 11/18/2021   CRP 8.9 (H) 09/16/2017   REPTSTATUS 07/02/2022 FINAL 07/01/2022   CULT (A) 07/01/2022    <10,000 COLONIES/mL INSIGNIFICANT GROWTH Performed at [facility], [address] [place], [name]    [id number] ENTEROCOCCUS FAECALIS (A) 01/06/2022    Lab Results  Component Value Date   ALBUMIN 2.2 (L) 07/04/2022   ALBUMIN 2.1 (L) 07/03/2022   ALBUMIN 2.0 (L) 07/02/2022   PREALBUMIN 16.5 (L) 01/07/2022        Latest Ref Rng & Units 07/05/2022    4:46 AM 07/04/2022    1:46 AM 07/03/2022    3:25 AM  CBC EXTENDED  WBC 4.0 - 10.5 K/uL 8.5  7.7  6.5   RBC 4.22 - 5.81 MIL/uL 4.03  4.00  3.77    3.78    Hemoglobin 13.0 - 17.0 g/dL 9.7  9.5  9.1   HCT 16.1 - 52.0 % 32.0  31.2  29.7   Platelets 150 - 400 K/uL 235  254  246     Neurologic: Patient does not have protective sensation bilateral lower extremities.   MUSCULOSKELETAL:   Skin: Examination patient has a necrotic ulcer over the right calcaneus.  The ulcer probes to bone.  Review of the MRI scan shows edema and the calcaneus consistent with osteomyelitis.  New  Patient has a palpable dorsalis pedis pulse ankle-brachial indices shows triphasic flow.  Patient has some mild redness of the left transtibial amputation but no evidence of infection.  Hemoglobin 9.7 with an albumin of 2.2.  Most recent hemoglobin A1c 7.2.  Assessment: Assessment: Diabetic insensate neuropathy with necrotic right heel ulcer and osteomyelitis of the calcaneus with a left transtibial amputation.  Plan: Plan: I discussed with the patient and his wife recommendation to proceed with a transtibial amputation on the right.  All questions were answered.  Plan for surgery Wednesday.  Anticipate discharge to inpatient versus outpatient rehab.  Thank you for the consult and the opportunity to see Mr. Enio Hornback, MD [facility] [phone] 8:05 AM

## 2022-07-05 NOTE — Evaluation (Signed)
Occupational Therapy Evaluation Patient Details Name: Joseph [name] MRN: [id number] DOB: 01/23/1971 Today's Date: 07/05/2022   History of Present Illness 51 y.o. male Admitted 7/12 for acute metabolic encephalopathy in the setting of acute respiratory failure with hypercarbia and diabetic right foot wound infection and osteomyelitis. Marland Kitchen PMH includes CAD, MI, DM, L BKA, chronic R heel ulcer, COPD, anxiety, depression.   Clinical Impression   Pt independent at baseline with ADLs, sponge bathes and uses w/c and reports "hopping" short distances for mobility. Pt currently impulsive with poor awareness of deficits/safety, stating "once I put my mind to something, I'm just going to do it" when educated on NWB precautions. Pt states he manages his own meds at home, however with apparent poor adherence as pt taking meds in AM or PM even if advised to take 3x/day. Pt presenting with impairments listed below, will follow acutely. Recommend AIR at d/c.      Recommendations for follow up therapy are one component of a multi-disciplinary discharge planning process, led by the attending physician.  Recommendations may be updated based on patient status, additional functional criteria and insurance authorization.   Follow Up Recommendations  Acute inpatient rehab (3hours/day)    Assistance Recommended at Discharge Frequent or constant Supervision/Assistance  Patient can return home with the following Two people to help with walking and/or transfers;A lot of help with bathing/dressing/bathroom;Direct supervision/assist for medications management;Direct supervision/assist for financial management;Assist for transportation;Help with stairs or ramp for entrance    Functional Status Assessment  Patient has had a recent decline in their functional status and demonstrates the ability to make significant improvements in function in a reasonable and predictable amount of time.  Equipment Recommendations  None  recommended by OT;Other (comment) (to be evaluated post-surgery)    Recommendations for Other Services PT consult;Rehab consult     Precautions / Restrictions Precautions Precautions: Fall Required Braces or Orthoses: Other Brace Other Brace: Wears BKA prosthesis for LLE. Restrictions Weight Bearing Restrictions:  (assuming NWB) Other Position/Activity Restrictions: (awaiting order)      Mobility Bed Mobility Overal bed mobility: Modified Independent             General bed mobility comments: use of bed rail    Transfers                   General transfer comment: deferred for safety, pt stating he is going to "do what he wants to do" in terms of adhering to R NWB precautions,      Balance Overall balance assessment: Mild deficits observed, not formally tested                                         ADL either performed or assessed with clinical judgement   ADL Overall ADL's : Needs assistance/impaired Eating/Feeding: Modified independent   Grooming: Modified independent   Upper Body Bathing: Supervision/ safety   Lower Body Bathing: Moderate assistance   Upper Body Dressing : Supervision/safety   Lower Body Dressing: Moderate assistance   Toilet Transfer: Moderate assistance;Maximal assistance   Toileting- Clothing Manipulation and Hygiene: Supervision/safety       Functional mobility during ADLs: Min guard       Vision Baseline Vision/History: 1 Wears glasses Vision Assessment?: No apparent visual deficits     Perception     Praxis      Pertinent Vitals/Pain Pain Assessment Pain Assessment:  Faces Pain Score: 8  Faces Pain Scale: Hurts whole lot Pain Location: "all over" Pain Descriptors / Indicators: Discomfort, Aching Pain Intervention(s): Limited activity within patient's tolerance, Monitored during session, Repositioned     Hand Dominance Right   Extremity/Trunk Assessment Upper Extremity  Assessment Upper Extremity Assessment: Overall WFL for tasks assessed   Lower Extremity Assessment Lower Extremity Assessment: Defer to PT evaluation   Cervical / Trunk Assessment Cervical / Trunk Assessment: Normal   Communication Communication Communication: No difficulties   Cognition Arousal/Alertness: Awake/alert Behavior During Therapy: WFL for tasks assessed/performed Overall Cognitive Status: No family/caregiver present to determine baseline cognitive functioning                                 General Comments: poor health literacy in regards to med mgmt, decreased awareness of deficits, poor adherence to precautions, states " if I put my  mind to something, Im going to do it", often masking with humor     General Comments  VSS on RA    Exercises     Shoulder Instructions      Home Living Family/patient expects to be discharged to:: Inpatient rehab Living Arrangements: Spouse/significant other;Children;Other (Comment) Available Help at Discharge: Family;Available 24 hours/day Type of Home: House Home Access: Ramped entrance     Home Layout: One level     Bathroom Shower/Tub: Tub/shower unit     Bathroom Accessibility: No   Home Equipment: Shower seat;Cane - single point;Rolling Walker (2 wheels);Wheelchair - manual   Additional Comments: Staying with an aunt/uncle.      Prior Functioning/Environment Prior Level of Function : Needs assist             Mobility Comments: pt reports he hops from bed to wheelchair <10' and then from bathroom door to commode. Also mentions going out into the community some and using w/c. w/c does not fit in bathroom or bedroom ADLs Comments: takes a sponge bath        OT Problem List: Decreased strength;Decreased range of motion;Decreased activity tolerance;Impaired balance (sitting and/or standing);Decreased cognition;Decreased safety awareness;Decreased knowledge of precautions;Decreased knowledge of  use of DME or AE      OT Treatment/Interventions: Self-care/ADL training;Therapeutic exercise;Energy conservation;DME and/or AE instruction;Therapeutic activities;Patient/family education;Balance training    OT Goals(Current goals can be found in the care plan section) Acute Rehab OT Goals Patient Stated Goal: none stated OT Goal Formulation: With patient Time For Goal Achievement: 07/19/22 Potential to Achieve Goals: Good  OT Frequency: Min 2X/week    Co-evaluation              AM-PAC OT "6 Clicks" Daily Activity     Outcome Measure Help from another person eating meals?: None Help from another person taking care of personal grooming?: None Help from another person toileting, which includes using toliet, bedpan, or urinal?: A Lot Help from another person bathing (including washing, rinsing, drying)?: A Lot Help from another person to put on and taking off regular upper body clothing?: A Little Help from another person to put on and taking off regular lower body clothing?: A Lot 6 Click Score: 17   End of Session Nurse Communication: Mobility status  Activity Tolerance: Patient tolerated treatment well Patient left: in bed;with call bell/phone within reach;with bed alarm set  OT Visit Diagnosis: Unsteadiness on feet (R26.81);Other abnormalities of gait and mobility (R26.89);Muscle weakness (generalized) (M62.81)  Time: 8891-6945 OT Time Calculation (min): 15 min Charges:  OT General Charges $OT Visit: 1 Visit OT Evaluation $OT Eval Low Complexity: 1 Low  Joseph [name], Joseph [name], Joseph [name] Acute Rehab [phone]  Mayer Masker 07/05/2022, 1:30 PM

## 2022-07-05 NOTE — Care Management Important Message (Signed)
Important Message  Patient Details  Name: Joseph [name] MRN: [id number] Date of Birth: Nov 21, 1971   Medicare Important Message Given:  Yes     Dorena Bodo 07/05/2022, 3:17 PM

## 2022-07-05 NOTE — H&P (View-Only) (Signed)
ORTHOPAEDIC CONSULTATION  REQUESTING PHYSICIAN: Elgergawy, Leana Roe, MD  Chief Complaint: Necrotic ulcer right heel.  HPI: Joseph [name] is a 51 y.o. male who presents with necrotic ulcer right heel with osteomyelitis.  Patient is status post left transtibial amputation has ambulated well with his prosthesis.  Patient also has a history of coronary artery disease, with type 2 diabetes and severe protein caloric malnutrition.  Past Medical History:  Diagnosis Date   Aneurysm of ascending aorta (HCC) 09/14/2021   Anxiety    Arthritis    "knees, shoulders, hips, ankles" (07/29/2016)   Asthma    Bicuspid aortic valve 09/14/2021   CAD (coronary artery disease)    a. 2017: s/p BMS to distal Cx; b. LHC 05/30/2019: 80% mid, distal RCA s/p DES, 30% narrowing of d LM, widely patent LAD w/ luminal irregularities, widely patent stent in dCX  w/ 90+% stenosis distal to stent beofre small trifurcating obtuse marginal (potentially area of restenosis)   Chronic bronchitis (HCC)    Chronic ulcer of great toe of right foot (HCC) 09/09/2017   Chronic ulcer of right great toe (HCC) 09/19/2017   COPD (chronic obstructive pulmonary disease) (HCC)    Depression    DVT (deep venous thrombosis) (HCC)    GERD (gastroesophageal reflux disease)    Hyperlipidemia    Hypertension    Myocardial infarction (HCC) 1996   "light one"   PE (pulmonary embolism) 04/2013   On chronic Xarelto   Peripheral nerve disease    Pneumonia "several times"   Sleep apnea    Type II diabetes mellitus (HCC)    Past Surgical History:  Procedure Laterality Date   ABDOMINAL AORTOGRAM W/LOWER EXTREMITY N/A 10/10/2020   Procedure: ABDOMINAL AORTOGRAM W/LOWER EXTREMITY;  Surgeon: Sherren Kerns, MD;  [facility]: [facility];  Service: Cardiovascular;  Laterality: N/A;   AMPUTATION Right 09/21/2017   Procedure: RIGHT GREAT TOE AMPUTATION, POSSIBLE VAC;  Surgeon: Tarry Kos, MD;  Location: [facility] OR;  Service: Orthopedics;   Laterality: Right;   AMPUTATION Left 08/13/2020   Procedure: LEFT FOOT 4TH RAY AMPUTATION;  Surgeon: [name], MD;  Location: [facility] OR;  Service: Orthopedics;  Laterality: Left;   AMPUTATION Left 11/20/2021   Procedure: LEFT TRANSMETATARSAL AMPUTATION;  Surgeon: [name], MD;  Location: [facility] OR;  Service: Orthopedics;  Laterality: Left;   AMPUTATION Left 01/08/2022   Procedure: LEFT BELOW KNEE AMPUTATION;  Surgeon: [name], MD;  Location: [facility] OR;  Service: Orthopedics;  Laterality: Left;   AORTOGRAM Bilateral 03/13/2021   Procedure: ABDOMINAL AORTOGRAM WITH Left LOWER EXTREMITY RUNOFF;  Surgeon: Leonie Douglas, MD;  Location: [facility] OR;  Service: Vascular;  Laterality: Bilateral;   APPLICATION OF WOUND VAC  01/08/2022   Procedure: APPLICATION OF WOUND VAC;  Surgeon: [name], MD;  Location: [facility] OR;  Service: Orthopedics;;   CARDIAC CATHETERIZATION  2006   CARDIAC CATHETERIZATION  1996   "@ [facility]; when I had my heart attack"   CARDIAC CATHETERIZATION N/A 07/29/2016   Procedure: Left Heart Cath and Coronary Angiography;  Surgeon: Runell Gess, MD;  Location: [facility];  Service: Cardiovascular;  Laterality: N/A;   CARDIAC CATHETERIZATION N/A 07/29/2016   Procedure: Coronary Stent Intervention;  Surgeon: Runell Gess, MD;  [facility]: [facility];  Service: Cardiovascular;  Laterality: N/A;   CARPAL TUNNEL RELEASE Bilateral    CORONARY ANGIOPLASTY WITH STENT PLACEMENT  07/29/2016   CORONARY ARTERY BYPASS GRAFT N/A 07/24/2020  Procedure: CORONARY ARTERY BYPASS GRAFTING (CABG), ON PUMP, TIMES FOUR, USING LEFT INTERNAL MAMMARY ARTERY AND ENDOSCOPICALLY HARVESTED RIGHT GREATER SAPHENOUS VEIN;  Surgeon: Corliss SkainsLightfoot, Harrell O, MD;  Location: [facility] OR;  Service: Open Heart Surgery;  Laterality: N/A;  FLOW TAC   CORONARY STENT INTERVENTION N/A 05/30/2019   Procedure: CORONARY STENT INTERVENTION;  Surgeon: Lyn RecordsSmith, Henry W, MD;  [facility]: [facility];  Service: Cardiovascular;   Laterality: N/A;   CORONARY STENT INTERVENTION N/A 11/02/2019   Procedure: CORONARY STENT INTERVENTION;  Surgeon: Iran OuchArida, Muhammad A, MD;  [facility]: [facility];  Service: Cardiovascular;  Laterality: N/A;   ESOPHAGOGASTRODUODENOSCOPY N/A 09/22/2017   Procedure: ESOPHAGOGASTRODUODENOSCOPY (EGD);  Surgeon: Meryl DareStark, Malcolm T, MD;  Location: [facility];  Service: Endoscopy;  Laterality: N/A;   INTRAVASCULAR PRESSURE WIRE/FFR STUDY N/A 07/22/2020   Procedure: INTRAVASCULAR PRESSURE WIRE/FFR STUDY;  Surgeon: Marykay LexHarding, David W, MD;  [facility];  Service: Cardiovascular;  Laterality: N/A;   KNEE ARTHROSCOPY Bilateral    "2 on left; 1 on the right"   LEFT HEART CATH AND CORONARY ANGIOGRAPHY N/A 11/02/2019   Procedure: LEFT HEART CATH AND CORONARY ANGIOGRAPHY;  Surgeon: Iran OuchArida, Muhammad A, MD;  [facility];  Service: Cardiovascular;  Laterality: N/A;   LEFT HEART CATH AND CORONARY ANGIOGRAPHY N/A 07/22/2020   Procedure: LEFT HEART CATH AND CORONARY ANGIOGRAPHY;  Surgeon: Marykay LexHarding, David W, MD;  [facility];  Service: Cardiovascular;  Laterality: N/A;   LEFT HEART CATH AND CORS/GRAFTS ANGIOGRAPHY N/A 08/17/2021   Procedure: LEFT HEART CATH AND CORS/GRAFTS ANGIOGRAPHY;  Surgeon: Yvonne KendallEnd, Christopher, MD;  [facility];  Service: Cardiovascular;  Laterality: N/A;   PERIPHERAL VASCULAR INTERVENTION Left 10/11/2020   popliteal and SFA stent placement    PERIPHERAL VASCULAR INTERVENTION Left 10/10/2020   Procedure: PERIPHERAL VASCULAR INTERVENTION;  Surgeon: Sherren KernsFields, Charles E, MD;  [facility];  Service: Cardiovascular;  Laterality: Left;   RIGHT/LEFT HEART CATH AND CORONARY ANGIOGRAPHY N/A 05/30/2019   Procedure: RIGHT/LEFT HEART CATH AND CORONARY ANGIOGRAPHY;  Surgeon: Lyn RecordsSmith, Henry W, MD;  [facility];  Service: Cardiovascular;  Laterality: N/A;   RIGHT/LEFT HEART CATH AND CORONARY/GRAFT ANGIOGRAPHY N/A 06/17/2022    Procedure: RIGHT/LEFT HEART CATH AND CORONARY/GRAFT ANGIOGRAPHY;  Surgeon: Lennette BihariKelly, Thomas A, MD;  [facility];  Service: Cardiovascular;  Laterality: N/A;   SHOULDER OPEN ROTATOR CUFF REPAIR Bilateral    TEE WITHOUT CARDIOVERSION N/A 07/24/2020   Procedure: TRANSESOPHAGEAL ECHOCARDIOGRAM (TEE);  Surgeon: Corliss SkainsLightfoot, Harrell O, MD;  [facility];  Service: Open Heart Surgery;  Laterality: N/A;   Social History   Socioeconomic History   Marital status: Married    Spouse name: Not on file   Number of children: Not on file   Years of education: Not on file   Highest education level: Not on file  Occupational History   Occupation: disabled     Employer: UNEMPLOYED  Tobacco Use   Smoking status: Former    Packs/day: 1.00    Years: 34.00    Total pack years: 34.00    Types: Cigarettes    Quit date: 07/12/2020    Years since quitting: 1.9   Smokeless tobacco: Former    Types: Snuff, Chew  Vaping Use   Vaping Use: Never used  Substance and Sexual Activity   Alcohol use: Yes    Comment: RARE   Drug use: No   Sexual activity: Yes  Other Topics  Concern   Not on file  Social History Narrative   Not on file   Social Determinants of Health   Financial Resource Strain: Not on file  Food Insecurity: Not on file  Transportation Needs: Not on file  Physical Activity: Not on file  Stress: Not on file  Social Connections: Not on file   Family History  Problem Relation Age of Onset   Hypertension Brother    Hypertension Father    Diabetes Other    Hyperlipidemia Other    - negative except otherwise stated in the family history section Allergies  Allergen Reactions   Sulfa Antibiotics Anaphylaxis and Other (See Comments)    Headaches    Prior to Admission medications   Medication Sig Start Date End Date Taking? Authorizing Provider  acetaminophen (TYLENOL) 500 MG tablet Take 1,000 mg by mouth 3 (three) times daily as needed for headache.   Yes [provider]  albuterol (VENTOLIN HFA) 108 (90 Base) MCG/ACT inhaler Inhale 2 puffs into the lungs every 6 (six) hours.   Yes [provider]  ALPRAZolam Prudy Feeler) 1 MG tablet Take 2 mg by mouth at bedtime. 03/24/22  Yes [provider]  aspirin EC 81 MG tablet Take 81 mg by mouth in the morning. Swallow whole.   Yes [provider]  atorvastatin (LIPITOR) 80 MG tablet Take 1 tablet (80 mg total) by mouth daily at 6 PM. 05/31/19  Yes Marjie Skiff E, PA-C  buPROPion (WELLBUTRIN XL) 150 MG 24 hr tablet Take 150 mg by mouth in the morning and at bedtime.   Yes [provider]  calcium carbonate (TUMS EXTRA STRENGTH 750) 750 MG chewable tablet Chew 1,500 mg by mouth as needed (stomach pain).   Yes [provider]  carvedilol (COREG) 3.125 MG tablet Take 3.125 mg by mouth 2 (two) times daily. 02/22/22  Yes [provider]  clopidogrel (PLAVIX) 75 MG tablet Take 1 tablet (75 mg total) by mouth daily with breakfast. 10/11/20  Yes Lars Mage, PA-C  doxycycline (VIBRA-TABS) 100 MG tablet Take 1 tablet (100 mg total) by mouth 2 (two) times daily. 06/09/22  Yes [name] R, NP  escitalopram (LEXAPRO) 10 MG tablet Take 10 mg by mouth in the morning. 01/27/22  Yes [provider]  fluconazole (DIFLUCAN) 100 MG tablet Take 100 mg by mouth every Monday. 03/08/22  Yes [provider]  fluticasone (FLONASE) 50 MCG/ACT nasal spray Place 2 sprays into both nostrils daily.   Yes [provider]  furosemide (LASIX) 40 MG tablet TAKE 1 TABLET(40 MG) BY MOUTH DAILY Patient taking differently: Take 40 mg by mouth daily. 05/03/22  Yes Jodelle Red, MD  GVOKE HYPOPEN 2-PACK 1 MG/0.2ML SOAJ Inject 1 mg into the skin as needed (low blood sugar). 04/21/22  Yes [provider]  icosapent Ethyl (VASCEPA) 1 g capsule Take 2 capsules (2 g total) by mouth 2 (two) times daily. 09/02/21  Yes Chilton Si, MD  insulin aspart  (NOVOLOG) 100 UNIT/ML FlexPen Inject 6 Units into the skin 3 (three) times daily with meals. Per sliding scale Patient taking differently: Inject 2-36 Units into the skin 3 (three) times daily with meals. 11/25/21  Yes Rhetta Mura, MD  LANTUS SOLOSTAR 100 UNIT/ML Solostar Pen Inject 50 Units into the skin in the morning and at bedtime. 04/11/22  Yes [provider]  liraglutide (VICTOZA) 18 MG/3ML SOPN Inject 1.8 mg into the skin daily. Patient taking differently: Inject 1.2 mg into the  skin every evening. 07/17/21  Yes Shah, Pratik D, DO  loratadine (CLARITIN) 10 MG tablet Take 10 mg by mouth daily. 12/22/21  Yes [provider]  meclizine (ANTIVERT) 25 MG tablet Take 25-50 mg by mouth See admin instructions. Take 50 mg  by mouth in the morning and take 25 mg  by mouth at night. 02/22/22  Yes [provider]  metFORMIN (GLUCOPHAGE) 1000 MG tablet Take 1 tablet (1,000 mg total) by mouth 2 (two) times daily with a meal. 11/05/19  Yes Leone Brand, NP  naloxone [facility]) nasal spray 4 mg/0.1 mL Place 4 mg into the nose as needed (overdose).   Yes [provider]  nitroGLYCERIN (NITROSTAT) 0.4 MG SL tablet Place 0.4 mg under the tongue every 5 (five) minutes x 3 doses as needed for chest pain.   Yes [provider]  oxyCODONE (ROXICODONE) 15 MG immediate release tablet Take 15 mg by mouth 5 (five) times daily. 04/01/22  Yes [provider]  pantoprazole (PROTONIX) 40 MG tablet Take 1 tablet (40 mg total) by mouth daily. Patient taking differently: Take 80 mg by mouth daily before breakfast. 09/23/17  Yes Hongalgi, Maximino Greenland, MD  pregabalin (LYRICA) 150 MG capsule Take 2 capsules (300 mg total) by mouth in the morning. Patient taking differently: Take 150-300 mg by mouth See admin instructions. Take 300 mg by mouth in the morning and take 150 mg  by mouth at night. 11/25/21  Yes Rhetta Mura, MD  promethazine (PHENERGAN) 25 MG tablet Take 25 mg  by mouth every 4 (four) hours as needed for nausea or vomiting. 02/26/22  Yes [provider]  ranolazine (RANEXA) 500 MG 12 hr tablet Take 1 tablet (500 mg total) by mouth 2 (two) times daily. 09/02/21  Yes Chilton Si, MD  mupirocin ointment (BACTROBAN) 2 % Apply 1 Application topically 2 (two) times daily as needed (wound care). Patient not taking: Reported on 07/01/2022    [provider]   No results found. - pertinent xrays, CT, MRI studies were reviewed and independently interpreted  Positive ROS: All other systems have been reviewed and were otherwise negative with the exception of those mentioned in the HPI and as above.  Physical Exam: General: Alert, no acute distress Psychiatric: Patient is competent for consent with normal mood and affect Lymphatic: No axillary or cervical lymphadenopathy Cardiovascular: No pedal edema Respiratory: No cyanosis, no use of accessory musculature GI: No organomegaly, abdomen is soft and non-tender    Images:  @ENCIMAGES @  Labs:  Lab Results  Component Value Date   HGBA1C 7.2 (H) 07/03/2022   HGBA1C 7.1 (H) 04/15/2022   HGBA1C 8.4 (H) 01/07/2022   ESRSEDRATE 62 (H) 07/01/2022   ESRSEDRATE 45 (H) 11/18/2021   ESRSEDRATE 41 (H) 09/16/2017   CRP 12.0 (H) 07/01/2022   CRP 12.1 (H) 11/18/2021   CRP 8.9 (H) 09/16/2017   REPTSTATUS 07/02/2022 FINAL 07/01/2022   CULT (A) 07/01/2022    <10,000 COLONIES/mL INSIGNIFICANT GROWTH Performed at [facility], [address] [place], [name]    [id number] ENTEROCOCCUS FAECALIS (A) 01/06/2022    Lab Results  Component Value Date   ALBUMIN 2.2 (L) 07/04/2022   ALBUMIN 2.1 (L) 07/03/2022   ALBUMIN 2.0 (L) 07/02/2022   PREALBUMIN 16.5 (L) 01/07/2022        Latest Ref Rng & Units 07/05/2022    4:46 AM 07/04/2022    1:46 AM 07/03/2022    3:25 AM  CBC EXTENDED  WBC 4.0 - 10.5 K/uL 8.5  7.7  6.5   RBC 4.22 - 5.81 MIL/uL 4.03  4.00  3.77    3.78    Hemoglobin 13.0 - 17.0 g/dL 9.7  9.5  9.1   HCT 16.1 - 52.0 % 32.0  31.2  29.7   Platelets 150 - 400 K/uL 235  254  246     Neurologic: Patient does not have protective sensation bilateral lower extremities.   MUSCULOSKELETAL:   Skin: Examination patient has a necrotic ulcer over the right calcaneus.  The ulcer probes to bone.  Review of the MRI scan shows edema and the calcaneus consistent with osteomyelitis.  New  Patient has a palpable dorsalis pedis pulse ankle-brachial indices shows triphasic flow.  Patient has some mild redness of the left transtibial amputation but no evidence of infection.  Hemoglobin 9.7 with an albumin of 2.2.  Most recent hemoglobin A1c 7.2.  Assessment: Assessment: Diabetic insensate neuropathy with necrotic right heel ulcer and osteomyelitis of the calcaneus with a left transtibial amputation.  Plan: Plan: I discussed with the patient and his wife recommendation to proceed with a transtibial amputation on the right.  All questions were answered.  Plan for surgery Wednesday.  Anticipate discharge to inpatient versus outpatient rehab.  Thank you for the consult and the opportunity to see Mr. Enio Hornback, MD [facility] [phone] 8:05 AM

## 2022-07-05 NOTE — Progress Notes (Signed)
PROGRESS NOTE  Joseph [name] TGG:[id number] DOB: 01/16/1971   PCP: [name], NP  Patient is from: Home.  Lives with his wife.  Walker and wheelchair at baseline.  DOA: 06/30/2022 LOS: 4  Chief complaints Chief Complaint  Patient presents with   Fatigue     Brief Narrative / Interim history: 51 year old M with PMH of CAD/CABG/stents, diastolic CHF, PE not on AC, COPD, OSA not on CPAP or oxygen, IDDM-2, PAD, chronic right heel ulcer with early osteomyelitis, left BKA, morbid obesity, anxiety, depression and chronic pain presenting with altered mental status headache, nausea with increased lethargy, confusion, shortness of breath, edema and poor p.o. intake, and admitted for acute metabolic encephalopathy in the setting of acute respiratory failure with hypercarbia and diabetic right foot wound infection and osteomyelitis.  Reportedly, he also had a fall the morning of presentation.    In ED, he was hypotensive to 81/57.  Other vital stable.  K6.2. Cr 1.93 (baseline 1.5).  BUN 44.  Troponin 55>> 67.  Lactic acid 2.2>> 1.2.  WBC 10.5.  Hgb 10.0.  EtOH level negative.  CXR showed left basilar atelectasis.  Right foot x-ray shows increased soft tissue swelling and large heel ulcer.  ABG 7.16/70/47/25.  Cultures obtained.  Started on BiPAP, IV fluids, broad-spectrum antibiotics and Lokelma, and admitted.  The next day, encephalopathy resolved.  Weaned off BiPAP.  Orthopedic surgery consulted, and will evaluate patient on Monday.  Holding Plavix in case Dr. Sharol Given want to proceed with surgery  Subjective:  No significant events overnight, he denies any complaints.  Pain is controlled. Objective: Vitals:   07/04/22 2050 07/05/22 0018 07/05/22 0423 07/05/22 0820  BP: 133/80 (!) 150/106 126/67 (!) 153/90  Pulse: 84 85 77 79  Resp: 18 16 18 16   Temp: 98.6 F (37 C) 98.8 F (37.1 C) 97.8 F (36.6 C) 98.3 F (36.8 C)  TempSrc: Oral Oral Oral Oral  SpO2: 98% 99% 96% 99%  Weight:       Height:        Examination:  Awake Alert, Oriented X 3, No new F.N deficits, Normal affect Symmetrical Chest wall movement, Good air movement bilaterally, CTAB RRR,No Gallops,Rubs or new Murmurs, No Parasternal Heave +ve B.Sounds, Abd Soft, No tenderness, No rebound - guarding or rigidity. No Cyanosis, Clubbing, has left BKA, right heel ulcer bandaged.   Procedures:  None  Microbiology summarized: Blood cultures NGTD. Urine culture with insignificant growth.  Assessment and plan: Principal Problem:   Acute metabolic encephalopathy Active Problems:   Hyperlipidemia LDL goal <70   CAD (coronary artery disease)   History of pulmonary embolus (PE)   OSA (obstructive sleep apnea)   Type 2 diabetes mellitus with right diabetic foot infection (HCC)   Anxiety and depression   COPD (chronic obstructive pulmonary disease) (HCC)   Obesity, Class III, BMI 40-49.9 (morbid obesity) (HCC)   Chronic pain   Acute renal failure superimposed on stage 3a chronic kidney disease (HCC)   GERD (gastroesophageal reflux disease)   (HFpEF) heart failure with preserved ejection fraction (HCC)   S/P CABG x 4   PAD (peripheral artery disease) (HCC)   Essential hypertension   DM (diabetes mellitus) with peripheral vascular complication (HCC)   Osteomyelitis of foot, right, acute (HCC)   Hx of BKA, left ([place])   Fall at home, initial encounter   Gangrene of right foot ([place])  Acute metabolic encephalopathy: Likely due to hypercarbia and polypharmacy.  Resolved. -Treat treatable causes -Minimize sedating medications -Reorientation  and delirium precautions  Diabetic right foot ulcer infection and possible early osteomyelitis: Patient with chronic right heel ulcer.  He is followed by Dr. Sharol Given.  MRI on 7/6 with mild linear subperiosteal marrow edema about the posterior aspect of the calcaneus with adjacent ulcer concerning for osteomyelitis.  He is on doxycycline outpatient. ABI in 12/2021 was basically  normal.  He has no fever or leukocytosis.  Blood cultures NGTD.  CRP and ESR elevated.  Sepsis ruled out. -Plavix on hold.  Last dose the morning of 7/13.  Resume after surgery -Continue broad-spectrum antibiotics -Orthopedic Dr. Sharol Given input greatly appreciated, plan for transtibial amputation 7/19  Acute respiratory failure with hypercarbia/chronic COPD/OSA/possible OHS not on CPAP after he lost his home: Resolved with BiPAP.  On room air.   -BiPAP as needed and at night-not compliant at night. -Minimum oxygen to keep saturation above 90% if needed -Minimize or avoid sedating medications -Continue home inhalers.  Hyperkalemia -Continue with Lokelma  Hypophosphatemia -Repleted  Chronic diastolic CHF: TTE in 0/0923 with LVEF of 55 to 60%, G2 DD and no RWMA   Difficult to assess fluid status due to body habitus but does not look fluid overloaded.  On p.o. Lasix 40 mg daily -Continue holding Lasix in the setting of AKI. -Monitor intake and output, daily weight and renal functions  Elevated troponin/history of CAD/CABG and stents/PAD:  Mildly elevated troponin likely demand ischemia and delayed clearance from AKI.  Patient has no anginal symptoms.  LHC on 06/17/2022 with severe native CAD with 80% distal left main stenosis with widely patent LIMA graft supplying the LAD and SVG supplying the diagonal vessel.  At that time, cardiology recommended medical therapy with DAPT, optimal blood pressure control and statin. -Continue coreg, aspirin and Ranexa -Hold Plavix in case of surgery(no PCI over last 12 months) -  Hold statin due to elevated CK.  Uncontrolled IDDM-2 with hypoglycemia, CKD-3A, foot ulcer, neuropathy and PAD: A1c 7.1% on 4/27. Recent Labs  Lab 07/04/22 0606 07/04/22 1454 07/04/22 1703 07/04/22 2123 07/05/22 0628  GLUCAP 148* 149* 131* 181* 134*  -Continue SSI-moderate -Continue NovoLog 3 units 3 times daily with meals -Monitor CBG  AKI on CKD-3A: Multifactorial-including  hypotension, diuretics, rhabdo.  AKI resolved. Recent Labs    04/16/22 0420 06/15/22 1140 06/30/22 1318 06/30/22 2318 07/01/22 0259 07/01/22 1451 07/02/22 0225 07/03/22 0325 07/04/22 0146 07/05/22 0446  BUN 26* 36* 44* 48* 47* 48* 50* 36* 28* 19  CREATININE 1.53* 1.44* 1.93* 2.54* 2.57* 2.46* 2.22* 1.44* 1.34* 1.14  -Continue IV fluid -Avoid nephrotoxic meds -Renally dose medications  Anemia of chronic disease: Drop in Hgb likely dilutional.  Anemia panel with some degree of iron deficiency Recent Labs    06/17/22 1012 06/30/22 1318 06/30/22 1658 06/30/22 1938 07/01/22 0259 07/01/22 0810 07/02/22 0225 07/03/22 0325 07/04/22 0146 07/05/22 0446  HGB 10.9* 10.0* 10.5* 9.5* 9.0* 9.5* 8.6* 9.1* 9.5* 9.7*  -Continue monitoring -IV ferric gluconate x1  hypomagnesemia:  -repleted  Fall at home: No report of head trauma or LOC.  CT head without acute finding. Traumatic rhabdomyolysis: CK elevated to 2412. Physical deconditioning/fatigue/left BKA -PT/OT consult after surgical evaluation -Fall precaution   History of PE-not on anticoagulation at home.  Anxiety/depression/chronic pain: On Xanax, Wellbutrin, Lexapro, oxycodone 15 mg 5 times a day and Lyrica 300 mg in the morning and 150 mg in the evening at home. -Continue home meds for anxiety and depression except Xanax. -Continue decreased dose of Lyrica 75 mg twice daily -Oxycodone 5  mg every 6 hours as needed for moderate pain -Oxycodone 10 mg every 6 hours as needed for severe pain  Morbid obesity Body mass index is 41.5 kg/m. -On Victoza.  Pressure skin injury/ulcer: POA -Orthopedic surgery   DVT prophylaxis:  Change Lovenox to subcu heparin  Code Status: Full code Family Communication: Updated patient's wife on 7/14. Level of care: Med-Surg Status is: Inpatient The patient will remain inpatient because: diabetic right foot ulcer infection and early osteomyelitis, and AKI   Final disposition:  TBD Consultants:  Orthopedic surgery  Sch Meds:  Scheduled Meds:  aspirin EC  81 mg Oral Daily   buPROPion  150 mg Oral Daily   carvedilol  3.125 mg Oral BID   enoxaparin (LOVENOX) injection  80 mg Subcutaneous Q24H   escitalopram  10 mg Oral q AM   icosapent Ethyl  2 g Oral BID   insulin aspart  0-15 Units Subcutaneous TID WC   insulin aspart  0-5 Units Subcutaneous QHS   insulin aspart  3 Units Subcutaneous TID WC   insulin glargine-yfgn  25 Units Subcutaneous BID   pantoprazole  40 mg Oral Daily   pregabalin  75 mg Oral BID   ranolazine  500 mg Oral BID   senna  2 tablet Oral Daily   sodium chloride flush  3 mL Intravenous Q12H   sodium zirconium cyclosilicate  10 g Oral Daily   Continuous Infusions:  ceFEPime (MAXIPIME) IV 2 g (07/05/22 0522)   metronidazole 500 mg (07/05/22 0819)   vancomycin 1,500 mg (07/04/22 1803)   PRN Meds:.acetaminophen **OR** acetaminophen, albuterol, hydrOXYzine, naloxone, ondansetron **OR** ondansetron (ZOFRAN) IV, mouth rinse, oxyCODONE  Antimicrobials: Anti-infectives (From admission, onward)    Start     Dose/Rate Route Frequency Ordered Stop   07/01/22 1800  vancomycin (VANCOREADY) IVPB 1500 mg/300 mL        1,500 mg 150 mL/hr over 120 Minutes Intravenous Every 24 hours 07/01/22 1122     06/30/22 2200  doxycycline (VIBRA-TABS) tablet 100 mg  Status:  Discontinued        100 mg Oral 2 times daily 06/30/22 1924 06/30/22 1946   06/30/22 2100  metroNIDAZOLE (FLAGYL) IVPB 500 mg        500 mg 100 mL/hr over 60 Minutes Intravenous Every 12 hours 06/30/22 2034     06/30/22 2042  vancomycin variable dose per unstable renal function (pharmacist dosing)  Status:  Discontinued         Does not apply See admin instructions 06/30/22 2042 07/01/22 1122   06/30/22 1700  vancomycin (VANCOCIN) 2,500 mg in sodium chloride 0.9 % 500 mL IVPB        2,500 mg 262.5 mL/hr over 120 Minutes Intravenous  Once 06/30/22 1645 06/30/22 2005   06/30/22 1700   ceFEPIme (MAXIPIME) 2 g in sodium chloride 0.9 % 100 mL IVPB        2 g 200 mL/hr over 30 Minutes Intravenous Every 8 hours 06/30/22 1646          I have personally reviewed the following labs and images: CBC: Recent Labs  Lab 06/30/22 1318 06/30/22 1658 07/01/22 0259 07/01/22 0810 07/02/22 0225 07/03/22 0325 07/04/22 0146 07/05/22 0446  WBC 10.5  --  9.7  --  7.5 6.5 7.7 8.5  NEUTROABS 7.7  --   --   --   --   --   --   --   HGB 10.0*   < > 9.0* 9.5* 8.6* 9.1* 9.5*  9.7*  HCT 33.8*   < > 31.7* 28.0* 28.4* 29.7* 31.2* 32.0*  MCV 80.1  --  84.1  --  79.8* 78.6* 78.0* 79.4*  PLT 290  --  241  --  237 246 254 235   < > = values in this interval not displayed.   BMP &GFR Recent Labs  Lab 07/01/22 1451 07/02/22 0225 07/03/22 0325 07/04/22 0146 07/05/22 0446  NA 138 137 139 141 138  K 6.2* 5.3* 5.5* 5.3* 4.9  CL 106 104 111 110 111  CO2 22 22 22  20* 23  GLUCOSE 139* 231* 177* 151* 152*  BUN 48* 50* 36* 28* 19  CREATININE 2.46* 2.22* 1.44* 1.34* 1.14  CALCIUM 8.4* 7.9* 8.2* 8.6* 8.8*  MG 1.5* 1.3* 1.6* 1.7  --   PHOS 4.3 3.5 2.5 2.4*  --    Estimated Creatinine Clearance: 129.7 mL/min (by C-G formula based on SCr of 1.14 mg/dL). Liver & Pancreas: Recent Labs  Lab 06/30/22 1318 07/01/22 0259 07/01/22 1451 07/02/22 0225 07/03/22 0325 07/04/22 0146  AST 24 16  --   --   --  28  ALT 17 14  --   --   --  20  ALKPHOS 107 81  --   --   --  73  BILITOT 0.5 0.5  --   --   --  0.2*  PROT 6.5 5.9*  --   --   --  5.8*  ALBUMIN 2.4* 2.1* 2.3* 2.0* 2.1* 2.2*   No results for input(s): "LIPASE", "AMYLASE" in the last 168 hours. Recent Labs  Lab 07/01/22 0800  AMMONIA 14   Diabetic: Recent Labs    07/03/22 0325  HGBA1C 7.2*   Recent Labs  Lab 07/04/22 0606 07/04/22 1454 07/04/22 1703 07/04/22 2123 07/05/22 0628  GLUCAP 148* 149* 131* 181* 134*   Cardiac Enzymes: Recent Labs  Lab 07/01/22 0800 07/02/22 0225 07/04/22 0146  CKTOTAL 367 2,412* 594*    No results for input(s): "PROBNP" in the last 8760 hours. Coagulation Profile: No results for input(s): "INR", "PROTIME" in the last 168 hours. Thyroid Function Tests: No results for input(s): "TSH", "T4TOTAL", "FREET4", "T3FREE", "THYROIDAB" in the last 72 hours.  Lipid Profile: No results for input(s): "CHOL", "HDL", "LDLCALC", "TRIG", "CHOLHDL", "LDLDIRECT" in the last 72 hours. Anemia Panel: Recent Labs    07/03/22 0325  VITAMINB12 1,654*  FOLATE 9.9  FERRITIN 43  TIBC 252  IRON 31*  RETICCTPCT 2.2   Urine analysis:    Component Value Date/Time   COLORURINE AMBER (A) 06/30/2022 1206   APPEARANCEUR HAZY (A) 06/30/2022 1206   LABSPEC 1.024 06/30/2022 1206   PHURINE 5.0 06/30/2022 1206   GLUCOSEU NEGATIVE 06/30/2022 1206   HGBUR NEGATIVE 06/30/2022 1206   BILIRUBINUR NEGATIVE 06/30/2022 1206   KETONESUR NEGATIVE 06/30/2022 1206   PROTEINUR 100 (A) 06/30/2022 1206   UROBILINOGEN 0.2 10/27/2007 1550   NITRITE NEGATIVE 06/30/2022 1206   LEUKOCYTESUR NEGATIVE 06/30/2022 1206   Sepsis Labs: Invalid input(s): "PROCALCITONIN", "LACTICIDVEN"  Microbiology: Recent Results (from the past 240 hour(s))  Blood culture (routine x 2)     Status: None   Collection Time: 06/30/22  1:18 PM   Specimen: BLOOD  Result Value Ref Range Status   Specimen Description BLOOD SITE NOT SPECIFIED  Final   Special Requests   Final    BOTTLES DRAWN AEROBIC AND ANAEROBIC Blood Culture results may not be optimal due to an inadequate volume of blood received in culture bottles  Culture   Final    NO GROWTH 5 DAYS Performed at [facility], [address] [place], [place] [address]    Report Status 07/05/2022 FINAL  Final  Blood culture (routine x 2)     Status: None   Collection Time: 06/30/22  4:32 PM   Specimen: BLOOD  Result Value Ref Range Status   Specimen Description BLOOD RIGHT ANTECUBITAL  Final   Special Requests   Final    BOTTLES DRAWN AEROBIC AND ANAEROBIC Blood  Culture results may not be optimal due to an excessive volume of blood received in culture bottles   Culture   Final    NO GROWTH 5 DAYS Performed at [facility], [place] [address] [place], [place] [address]    Report Status 07/05/2022 FINAL  Final  Urine Culture     Status: Abnormal   Collection Time: 07/01/22 12:06 PM   Specimen: Urine, Clean Catch  Result Value Ref Range Status   Specimen Description URINE, CLEAN CATCH  Final   Special Requests NONE  Final   Culture (A)  Final    <10,000 COLONIES/mL INSIGNIFICANT GROWTH Performed at [facility], [place] [address] [place], [place] [address]    Report Status 07/02/2022 FINAL  Final    Radiology Studies: No results found.    Phillips Climes MD [facility]  If 7PM-7AM, please contact night-coverage [email or web address] 07/05/2022, 11:02 AM

## 2022-07-06 DIAGNOSIS — M86171 Other acute osteomyelitis, right ankle and foot: Secondary | ICD-10-CM | POA: Diagnosis not present

## 2022-07-06 DIAGNOSIS — I96 Gangrene, not elsewhere classified: Secondary | ICD-10-CM | POA: Diagnosis not present

## 2022-07-06 DIAGNOSIS — G9341 Metabolic encephalopathy: Secondary | ICD-10-CM | POA: Diagnosis not present

## 2022-07-06 LAB — BASIC METABOLIC PANEL
Anion gap: 5 (ref 5–15)
BUN: 17 mg/dL (ref 6–20)
CO2: 23 mmol/L (ref 22–32)
Calcium: 9 mg/dL (ref 8.9–10.3)
Chloride: 111 mmol/L (ref 98–111)
Creatinine, Ser: 1.2 mg/dL (ref 0.61–1.24)
GFR, Estimated: >60 mL/min (ref >60)
Glucose, Bld: 143 mg/dL — ABNORMAL HIGH (ref 70–99)
Potassium: 5.1 mmol/L (ref 3.5–5.1)
Sodium: 139 mmol/L (ref 135–145)

## 2022-07-06 LAB — GLUCOSE, CAPILLARY
Glucose-Capillary: 127 mg/dL — ABNORMAL HIGH (ref 70–99)
Glucose-Capillary: 129 mg/dL — ABNORMAL HIGH (ref 70–99)
Glucose-Capillary: 188 mg/dL — ABNORMAL HIGH (ref 70–99)
Glucose-Capillary: 169 mg/dL — ABNORMAL HIGH (ref 70–99)

## 2022-07-06 LAB — VANCOMYCIN, PEAK: Vancomycin Pk: 39 ug/mL (ref 30–40)

## 2022-07-06 MED ORDER — ALPRAZOLAM 0.5 MG PO TABS
1.0000 mg | ORAL_TABLET | Freq: Every evening | ORAL | Status: DC | PRN
Start: 2022-07-06 — End: 2022-07-14
  Administered 2022-07-06 – 2022-07-12 (×5): 1 mg via ORAL
  Filled 2022-07-06 (×7): qty 2

## 2022-07-06 MED ORDER — INSULIN GLARGINE-YFGN 100 UNIT/ML ~~LOC~~ SOLN
15.0000 [IU] | Freq: Two times a day (BID) | SUBCUTANEOUS | Status: DC
  Administered 2022-07-07 – 2022-07-08 (×4): 15 [IU] via SUBCUTANEOUS
  Filled 2022-07-06 (×6): qty 0.15

## 2022-07-06 NOTE — Progress Notes (Signed)
Pharmacy Antibiotic Note-Follow Up  Joseph [name] is a 51 y.o. male for which pharmacy has been consulted for cefepime dosing for  osteomyelitis .  7/14: VP 39, 7/15 VT 11, calculated AUC came back as 531.5   Plan: Continue the following antibiotics: Flagyl 500mg  IV q12h Cefepime 2g IV q8h Vancomycin 1500mg  IV q24h   Monitor clinical progress, c/s, renal function F/u de-escalation plan/LOT, vancomycin levels as indicated   Height: 6\' 5"  (195.6 cm) Weight: (!) 172.5 kg (380 lb 4.7 oz) IBW/kg (Calculated) : 89.1  Temp (24hrs), Avg:98.3 F (36.8 C), Min:98.1 F (36.7 C), Max:98.7 F (37.1 C)  Recent Labs  Lab 06/30/22 1320 06/30/22 1935 06/30/22 2318 07/01/22 0259 07/01/22 1451 07/02/22 0225 07/02/22 2054 07/03/22 0325 07/03/22 1919 07/04/22 0146 07/05/22 0446 07/06/22 0454  WBC  --   --   --  9.7  --  7.5  --  6.5  --  7.7 8.5  --   CREATININE  --   --    < > 2.57*   < > 2.22*  --  1.44*  --  1.34* 1.14 1.20  LATICACIDVEN 2.2* 1.2  --   --   --   --   --   --   --   --   --   --   VANCOTROUGH  --   --   --   --   --   --   --   --  11*  --   --   --   VANCOPEAK  --   --   --   --   --   --  39  --   --   --   --   --    < > = values in this interval not displayed.     Estimated Creatinine Clearance: 129 mL/min (by C-G formula based on SCr of 1.2 mg/dL).    Allergies  Allergen Reactions   Sulfa Antibiotics Anaphylaxis and Other (See Comments)    Headaches     Lameisha Schuenemann BS, PharmD, BCPS Clinical Pharmacist 07/06/2022 9:51 AM  Contact: [phone] after 3 PM  "Be curious, not judgmental..." -07/08/22

## 2022-07-06 NOTE — Progress Notes (Signed)
PROGRESS NOTE  Joseph [name] ZDG:[id number] DOB: [birth year]   PCP: [name], NP  Patient is from: Home.  Lives with his wife.  Walker and wheelchair at baseline.  DOA: 06/30/2022 LOS: 5  Chief complaints Chief Complaint  Patient presents with   Fatigue     Brief Narrative / Interim history: 51 year old M with PMH of CAD/CABG/stents, diastolic CHF, PE not on AC, COPD, OSA not on CPAP or oxygen, IDDM-2, PAD, chronic right heel ulcer with early osteomyelitis, left BKA, morbid obesity, anxiety, depression and chronic pain presenting with altered mental status headache, nausea with increased lethargy, confusion, shortness of breath, edema and poor p.o. intake, and admitted for acute metabolic encephalopathy in the setting of acute respiratory failure with hypercarbia and diabetic right foot wound infection and osteomyelitis.  Reportedly, he also had a fall the morning of presentation.    In ED, he was hypotensive to 81/57.  Other vital stable.  K6.2. Cr 1.93 (baseline 1.5).  BUN 44.  Troponin 55>> 67.  Lactic acid 2.2>> 1.2.  WBC 10.5.  Hgb 10.0.  EtOH level negative.  CXR showed left basilar atelectasis.  Right foot x-ray shows increased soft tissue swelling and large heel ulcer.  ABG 7.16/70/47/25.  Cultures obtained.  Started on BiPAP, IV fluids, broad-spectrum antibiotics and Lokelma, and admitted.  The next day, encephalopathy resolved.  Weaned off BiPAP.  Orthopedic surgery consulted, was evaluated by Dr. Sharol Given, and plan for right transtibial amputation 7/19 .  Subjective:  Reports pain is controlled, reports insomnia, asking for his home dose Xanax to be resumed at bedtime, which I have done. Objective: Vitals:   07/06/22 0320 07/06/22 0500 07/06/22 0835 07/06/22 1146  BP: (!) 148/95  (!) 156/96 (!) 149/86  Pulse: 77  74 75  Resp: _0 Temp: 98.2 F (36.8 C)  98.3 F (36.8 C) 97.7 F (36.5 C)  TempSrc: Oral  Oral Oral  SpO2: 99%  96% 95%  Weight:  (!) 172.5 kg     Height:        Examination:  Awake Alert, Oriented X 3, No new F.N deficits, Normal affect Symmetrical Chest wall movement, Good air movement bilaterally, CTAB RRR,No Gallops,Rubs or new Murmurs, No Parasternal Heave +ve B.Sounds, Abd Soft, No tenderness, No rebound - guarding or rigidity.  No Cyanosis, Clubbing, has left BKA, right heel ulcer bandaged.   Procedures:  None  Microbiology summarized: Blood cultures NGTD. Urine culture with insignificant growth.  Assessment and plan: Principal Problem:   Acute metabolic encephalopathy Active Problems:   Hyperlipidemia LDL goal <70   CAD (coronary artery disease)   History of pulmonary embolus (PE)   OSA (obstructive sleep apnea)   Type 2 diabetes mellitus with right diabetic foot infection (HCC)   Anxiety and depression   COPD (chronic obstructive pulmonary disease) (HCC)   Obesity, Class III, BMI 40-49.9 (morbid obesity) (HCC)   Chronic pain   Acute renal failure superimposed on stage 3a chronic kidney disease (HCC)   GERD (gastroesophageal reflux disease)   (HFpEF) heart failure with preserved ejection fraction (HCC)   S/P CABG x 4   PAD (peripheral artery disease) (HCC)   Essential hypertension   DM (diabetes mellitus) with peripheral vascular complication (HCC)   Osteomyelitis of foot, right, acute (HCC)   Hx of BKA, left ([place])   Fall at home, initial encounter   Gangrene of right foot ([place])  Acute metabolic encephalopathy: Likely due to hypercarbia and polypharmacy.  Resolved. -Treat treatable  causes -Minimize sedating medications -Reorientation and delirium precautions  Diabetic right foot ulcer infection and possible early osteomyelitis: Patient with chronic right heel ulcer.  He is followed by Dr. Sharol Given.  MRI on 7/6 with mild linear subperiosteal marrow edema about the posterior aspect of the calcaneus with adjacent ulcer concerning for osteomyelitis.  He is on doxycycline outpatient. ABI in 12/2021 was  basically normal.  He has no fever or leukocytosis.  Blood cultures NGTD.  CRP and ESR elevated.  Sepsis ruled out. -Plavix on hold.  Last dose the morning of 7/13.  Resume Plavix after surgery once cleared by Dr. Sharol Given. -Continue broad-spectrum antibiotics -Orthopedic Dr. Sharol Given input greatly appreciated, plan for transtibial amputation 7/19  Acute respiratory failure with hypercarbia/chronic COPD/OSA/possible OHS not on CPAP after he lost his home: Resolved with BiPAP.  On room air.   -BiPAP as needed and at night-not compliant at night. -Minimum oxygen to keep saturation above 90% if needed -Minimize or avoid sedating medications -Continue home inhalers.  Hyperkalemia -Resolved with Lokelma  Hypophosphatemia -Repleted  Chronic diastolic CHF: TTE in 05/5992 with LVEF of 55 to 60%, G2 DD and no RWMA   Difficult to assess fluid status due to body habitus but does not look fluid overloaded.  On p.o. Lasix 40 mg daily -Continue holding Lasix in the setting of AKI. -Monitor intake and output, daily weight and renal functions  Elevated troponin/history of CAD/CABG and stents/PAD:  Mildly elevated troponin likely demand ischemia and delayed clearance from AKI.  Patient has no anginal symptoms.  LHC on 06/17/2022 with severe native CAD with 80% distal left main stenosis with widely patent LIMA graft supplying the LAD and SVG supplying the diagonal vessel.  At that time, cardiology recommended medical therapy with DAPT, optimal blood pressure control and statin. -Continue coreg, aspirin and Ranexa -Hold Plavix in case of surgery(no PCI over last 12 months) -  Hold statin due to elevated CK.  Uncontrolled IDDM-2 with hypoglycemia, CKD-3A, foot ulcer, neuropathy and PAD: A1c 7.1% on 4/27. Recent Labs  Lab 07/05/22 1157 07/05/22 1627 07/05/22 2141 07/06/22 0620 07/06/22 1138  GLUCAP 137* 168* 166* 127* 129*  -Continue SSI-moderate -Continue NovoLog 3 units 3 times daily with meals -Monitor  CBG  AKI on CKD-3A: Multifactorial-including hypotension, diuretics, rhabdo.  AKI resolved. Recent Labs    06/15/22 1140 06/30/22 1318 06/30/22 2318 07/01/22 0259 07/01/22 1451 07/02/22 0225 07/03/22 0325 07/04/22 0146 07/05/22 0446 07/06/22 0454  BUN 36* 44* 48* 47* 48* 50* 36* 28* 19 17  CREATININE 1.44* 1.93* 2.54* 2.57* 2.46* 2.22* 1.44* 1.34* 1.14 1.20  -Continue IV fluid -Avoid nephrotoxic meds -Renally dose medications  Anemia of chronic disease: Drop in Hgb likely dilutional.  Anemia panel with some degree of iron deficiency Recent Labs    06/17/22 1012 06/30/22 1318 06/30/22 1658 06/30/22 1938 07/01/22 0259 07/01/22 0810 07/02/22 0225 07/03/22 0325 07/04/22 0146 07/05/22 0446  HGB 10.9* 10.0* 10.5* 9.5* 9.0* 9.5* 8.6* 9.1* 9.5* 9.7*  -Continue monitoring -IV ferric gluconate x1  hypomagnesemia:  -repleted  Fall at home: No report of head trauma or LOC.  CT head without acute finding. Traumatic rhabdomyolysis: CK elevated to 2412. Physical deconditioning/fatigue/left BKA -PT/OT consult after surgical evaluation -Fall precaution   History of PE-not on anticoagulation at home.  Continue with DVT prophylaxis during hospital stay  Anxiety/depression/chronic pain: On Xanax, Wellbutrin, Lexapro, oxycodone 15 mg 5 times a day and Lyrica 300 mg in the morning and 150 mg in the evening at home. -Continue  home meds for anxiety and depression except Xanax. -Continue decreased dose of Lyrica 75 mg twice daily -Oxycodone 5 mg every 6 hours as needed for moderate pain -Oxycodone 10 mg every 6 hours as needed for severe pain  Morbid obesity Body mass index is 45.1 kg/m. -On Victoza.  Pressure skin injury/ulcer: POA -Orthopedic surgery   DVT prophylaxis:  Change Lovenox to subcu heparin  Code Status: Full code Family Communication: None at bedside Level of care: Med-Surg Status is: Inpatient The patient will remain inpatient because: diabetic right foot  ulcer infection and early osteomyelitis, and AKI   Final disposition: TBD Consultants:  Orthopedic surgery  Sch Meds:  Scheduled Meds:  aspirin EC  81 mg Oral Daily   buPROPion  150 mg Oral Daily   carvedilol  3.125 mg Oral BID   enoxaparin (LOVENOX) injection  80 mg Subcutaneous Q24H   escitalopram  10 mg Oral q AM   icosapent Ethyl  2 g Oral BID   insulin aspart  0-15 Units Subcutaneous TID WC   insulin aspart  0-5 Units Subcutaneous QHS   insulin aspart  3 Units Subcutaneous TID WC   insulin glargine-yfgn  25 Units Subcutaneous BID   pantoprazole  40 mg Oral Daily   pregabalin  75 mg Oral BID   ranolazine  500 mg Oral BID   senna  2 tablet Oral Daily   sodium chloride flush  3 mL Intravenous Q12H   Continuous Infusions:  ceFEPime (MAXIPIME) IV 2 g (07/06/22 0533)   metronidazole 500 mg (07/06/22 0859)   vancomycin Stopped (07/05/22 1953)   PRN Meds:.acetaminophen **OR** acetaminophen, albuterol, ALPRAZolam, hydrOXYzine, naloxone, ondansetron **OR** ondansetron (ZOFRAN) IV, mouth rinse, oxyCODONE  Antimicrobials: Anti-infectives (From admission, onward)    Start     Dose/Rate Route Frequency Ordered Stop   07/01/22 1800  vancomycin (VANCOREADY) IVPB 1500 mg/300 mL        1,500 mg 150 mL/hr over 120 Minutes Intravenous Every 24 hours 07/01/22 1122     06/30/22 2200  doxycycline (VIBRA-TABS) tablet 100 mg  Status:  Discontinued        100 mg Oral 2 times daily 06/30/22 1924 06/30/22 1946   06/30/22 2100  metroNIDAZOLE (FLAGYL) IVPB 500 mg        500 mg 100 mL/hr over 60 Minutes Intravenous Every 12 hours 06/30/22 2034     06/30/22 2042  vancomycin variable dose per unstable renal function (pharmacist dosing)  Status:  Discontinued         Does not apply See admin instructions 06/30/22 2042 07/01/22 1122   06/30/22 1700  vancomycin (VANCOCIN) 2,500 mg in sodium chloride 0.9 % 500 mL IVPB        2,500 mg 262.5 mL/hr over 120 Minutes Intravenous  Once 06/30/22 1645  06/30/22 2005   06/30/22 1700  ceFEPIme (MAXIPIME) 2 g in sodium chloride 0.9 % 100 mL IVPB        2 g 200 mL/hr over 30 Minutes Intravenous Every 8 hours 06/30/22 1646          I have personally reviewed the following labs and images: CBC: Recent Labs  Lab 06/30/22 1318 06/30/22 1658 07/01/22 0259 07/01/22 0810 07/02/22 0225 07/03/22 0325 07/04/22 0146 07/05/22 0446  WBC 10.5  --  9.7  --  7.5 6.5 7.7 8.5  NEUTROABS 7.7  --   --   --   --   --   --   --   HGB 10.0*   < >  9.0* 9.5* 8.6* 9.1* 9.5* 9.7*  HCT 33.8*   < > 31.7* 28.0* 28.4* 29.7* 31.2* 32.0*  MCV 80.1  --  84.1  --  79.8* 78.6* 78.0* 79.4*  PLT 290  --  241  --  237 246 254 235   < > = values in this interval not displayed.   BMP &GFR Recent Labs  Lab 07/01/22 1451 07/02/22 0225 07/03/22 0325 07/04/22 0146 07/05/22 0446 07/06/22 0454  NA 138 137 139 141 138 139  K 6.2* 5.3* 5.5* 5.3* 4.9 5.1  CL 106 104 111 110 111 111  CO2 _0 20* 23 23  GLUCOSE 139* 231* 177* 151* 152* 143*  BUN 48* 50* 36* 28* 19 17  CREATININE 2.46* 2.22* 1.44* 1.34* 1.14 1.20  CALCIUM 8.4* 7.9* 8.2* 8.6* 8.8* 9.0  MG 1.5* 1.3* 1.6* 1.7  --   --   PHOS 4.3 3.5 2.5 2.4*  --   --    Estimated Creatinine Clearance: 129 mL/min (by C-G formula based on SCr of 1.2 mg/dL). Liver & Pancreas: Recent Labs  Lab 06/30/22 1318 07/01/22 0259 07/01/22 1451 07/02/22 0225 07/03/22 0325 07/04/22 0146  AST 24 16  --   --   --  28  ALT 17 14  --   --   --  20  ALKPHOS 107 81  --   --   --  73  BILITOT 0.5 0.5  --   --   --  0.2*  PROT 6.5 5.9*  --   --   --  5.8*  ALBUMIN 2.4* 2.1* 2.3* 2.0* 2.1* 2.2*   No results for input(s): "LIPASE", "AMYLASE" in the last 168 hours. Recent Labs  Lab 07/01/22 0800  AMMONIA 14   Diabetic: No results for input(s): "HGBA1C" in the last 72 hours.  Recent Labs  Lab 07/05/22 1157 07/05/22 1627 07/05/22 2141 07/06/22 0620 07/06/22 1138  GLUCAP 137* 168* 166* 127* 129*   Cardiac  Enzymes: Recent Labs  Lab 07/01/22 0800 07/02/22 0225 07/04/22 0146  CKTOTAL 367 2,412* 594*   No results for input(s): "PROBNP" in the last 8760 hours. Coagulation Profile: No results for input(s): "INR", "PROTIME" in the last 168 hours. Thyroid Function Tests: No results for input(s): "TSH", "T4TOTAL", "FREET4", "T3FREE", "THYROIDAB" in the last 72 hours.  Lipid Profile: No results for input(s): "CHOL", "HDL", "LDLCALC", "TRIG", "CHOLHDL", "LDLDIRECT" in the last 72 hours. Anemia Panel: No results for input(s): "VITAMINB12", "FOLATE", "FERRITIN", "TIBC", "IRON", "RETICCTPCT" in the last 72 hours.  Urine analysis:    Component Value Date/Time   COLORURINE AMBER (A) 06/30/2022 1206   APPEARANCEUR HAZY (A) 06/30/2022 1206   LABSPEC 1.024 06/30/2022 1206   PHURINE 5.0 06/30/2022 1206   GLUCOSEU NEGATIVE 06/30/2022 1206   HGBUR NEGATIVE 06/30/2022 1206   BILIRUBINUR NEGATIVE 06/30/2022 1206   KETONESUR NEGATIVE 06/30/2022 1206   PROTEINUR 100 (A) 06/30/2022 1206   UROBILINOGEN 0.2 10/27/2007 1550   NITRITE NEGATIVE 06/30/2022 1206   LEUKOCYTESUR NEGATIVE 06/30/2022 1206   Sepsis Labs: Invalid input(s): "PROCALCITONIN", "LACTICIDVEN"  Microbiology: Recent Results (from the past 240 hour(s))  Blood culture (routine x 2)     Status: None   Collection Time: 06/30/22  1:18 PM   Specimen: BLOOD  Result Value Ref Range Status   Specimen Description BLOOD SITE NOT SPECIFIED  Final   Special Requests   Final    BOTTLES DRAWN AEROBIC AND ANAEROBIC Blood Culture results may not be optimal due to an inadequate  volume of blood received in culture bottles   Culture   Final    NO GROWTH 5 DAYS Performed at [facility], [place] [address], [place], [place] [address]    Report Status 07/05/2022 FINAL  Final  Blood culture (routine x 2)     Status: None   Collection Time: 06/30/22  4:32 PM   Specimen: BLOOD  Result Value Ref Range Status   Specimen Description BLOOD RIGHT  ANTECUBITAL  Final   Special Requests   Final    BOTTLES DRAWN AEROBIC AND ANAEROBIC Blood Culture results may not be optimal due to an excessive volume of blood received in culture bottles   Culture   Final    NO GROWTH 5 DAYS Performed at [facility], [place] [address] [place], [place] [address]    Report Status 07/05/2022 FINAL  Final  Urine Culture     Status: Abnormal   Collection Time: 07/01/22 12:06 PM   Specimen: Urine, Clean Catch  Result Value Ref Range Status   Specimen Description URINE, CLEAN CATCH  Final   Special Requests NONE  Final   Culture (A)  Final    <10,000 COLONIES/mL INSIGNIFICANT GROWTH Performed at [facility], [place] [address], [place], [place] [address]    Report Status 07/02/2022 FINAL  Final    Radiology Studies: No results found.    Phillips Climes MD [facility]  If 7PM-7AM, please contact night-coverage [email or web address] 07/06/2022, 12:38 PM

## 2022-07-07 ENCOUNTER — Other Ambulatory Visit: Payer: Self-pay

## 2022-07-07 ENCOUNTER — Encounter (HOSPITAL_COMMUNITY)
Admission: EM | Disposition: A | Discharge status: Rehabilitation Facility | Payer: Self-pay | Source: Home / Self Care | Attending: Internal Medicine

## 2022-07-07 ENCOUNTER — Inpatient Hospital Stay (HOSPITAL_COMMUNITY): Payer: Medicare HMO | Admitting: Anesthesiology

## 2022-07-07 DIAGNOSIS — M86171 Other acute osteomyelitis, right ankle and foot: Secondary | ICD-10-CM | POA: Diagnosis not present

## 2022-07-07 DIAGNOSIS — Z794 Long term (current) use of insulin: Secondary | ICD-10-CM | POA: Diagnosis not present

## 2022-07-07 DIAGNOSIS — E1169 Type 2 diabetes mellitus with other specified complication: Secondary | ICD-10-CM | POA: Diagnosis not present

## 2022-07-07 DIAGNOSIS — G9341 Metabolic encephalopathy: Secondary | ICD-10-CM | POA: Diagnosis not present

## 2022-07-07 DIAGNOSIS — E1152 Type 2 diabetes mellitus with diabetic peripheral angiopathy with gangrene: Secondary | ICD-10-CM | POA: Diagnosis not present

## 2022-07-07 DIAGNOSIS — Z862 Personal history of diseases of the blood and blood-forming organs and certain disorders involving the immune mechanism: Secondary | ICD-10-CM

## 2022-07-07 DIAGNOSIS — M869 Osteomyelitis, unspecified: Secondary | ICD-10-CM

## 2022-07-07 DIAGNOSIS — N179 Acute kidney failure, unspecified: Secondary | ICD-10-CM | POA: Diagnosis not present

## 2022-07-07 DIAGNOSIS — I25119 Atherosclerotic heart disease of native coronary artery with unspecified angina pectoris: Secondary | ICD-10-CM | POA: Diagnosis not present

## 2022-07-07 HISTORY — PX: APPLICATION OF WOUND VAC: SHX5189

## 2022-07-07 HISTORY — PX: AMPUTATION: SHX166

## 2022-07-07 LAB — CREATININE, SERUM
Creatinine, Ser: 1.24 mg/dL (ref 0.61–1.24)
GFR, Estimated: >60 mL/min (ref >60)

## 2022-07-07 LAB — GLUCOSE, CAPILLARY
Glucose-Capillary: 225 mg/dL — ABNORMAL HIGH (ref 70–99)
Glucose-Capillary: 180 mg/dL — ABNORMAL HIGH (ref 70–99)
Glucose-Capillary: 156 mg/dL — ABNORMAL HIGH (ref 70–99)
Glucose-Capillary: 142 mg/dL — ABNORMAL HIGH (ref 70–99)
Glucose-Capillary: 134 mg/dL — ABNORMAL HIGH (ref 70–99)
Glucose-Capillary: 215 mg/dL — ABNORMAL HIGH (ref 70–99)

## 2022-07-07 LAB — SURGICAL PCR SCREEN
MRSA, PCR: POSITIVE — AB
Staphylococcus aureus: POSITIVE — AB

## 2022-07-07 LAB — VANCOMYCIN, TROUGH: Vancomycin Tr: 10 ug/mL — ABNORMAL LOW (ref 15–20)

## 2022-07-07 SURGERY — AMPUTATION BELOW KNEE
Anesthesia: General | Site: Knee | Laterality: Right

## 2022-07-07 MED ORDER — SODIUM CHLORIDE 0.9 % IV SOLN: INTRAVENOUS | Status: DC

## 2022-07-07 MED ORDER — POTASSIUM CHLORIDE CRYS ER 20 MEQ PO TBCR: 20.0000 meq | EXTENDED_RELEASE_TABLET | Freq: Every day | ORAL | Status: DC | PRN

## 2022-07-07 MED ORDER — HYDRALAZINE HCL 20 MG/ML IJ SOLN: 5.0000 mg | INTRAMUSCULAR | Status: DC | PRN

## 2022-07-07 MED ORDER — OXYCODONE HCL 5 MG PO TABS
10.0000 mg | ORAL_TABLET | ORAL | Status: DC | PRN
  Administered 2022-07-07: 10 mg via ORAL
  Administered 2022-07-08 – 2022-07-10 (×8): 15 mg via ORAL
  Filled 2022-07-07 (×5): qty 3
  Filled 2022-07-07: qty 2
  Filled 2022-07-07 (×3): qty 3
  Filled 2022-07-07: qty 2

## 2022-07-07 MED ORDER — ASCORBIC ACID 500 MG PO TABS
1000.0000 mg | ORAL_TABLET | Freq: Every day | ORAL | Status: DC
  Administered 2022-07-07 – 2022-07-13 (×7): 1000 mg via ORAL
  Filled 2022-07-07 (×7): qty 2

## 2022-07-07 MED ORDER — PROPOFOL 10 MG/ML IV BOLUS
INTRAVENOUS | Status: AC
  Filled 2022-07-07: qty 20

## 2022-07-07 MED ORDER — CHLORHEXIDINE GLUCONATE 4 % EX LIQD: 60.0000 mL | Freq: Once | CUTANEOUS | Status: DC

## 2022-07-07 MED ORDER — FENTANYL CITRATE (PF) 100 MCG/2ML IJ SOLN: 100.0000 ug | Freq: Once | INTRAMUSCULAR | Status: AC

## 2022-07-07 MED ORDER — FENTANYL CITRATE (PF) 100 MCG/2ML IJ SOLN
INTRAMUSCULAR | Status: AC
  Administered 2022-07-07: 100 ug via INTRAVENOUS
  Filled 2022-07-07: qty 2

## 2022-07-07 MED ORDER — ALUM & MAG HYDROXIDE-SIMETH 200-200-20 MG/5ML PO SUSP: 15.0000 mL | ORAL | Status: DC | PRN

## 2022-07-07 MED ORDER — MIDAZOLAM HCL 2 MG/2ML IJ SOLN
INTRAMUSCULAR | Status: AC
  Filled 2022-07-07: qty 2

## 2022-07-07 MED ORDER — MIDAZOLAM HCL 2 MG/2ML IJ SOLN
INTRAMUSCULAR | Status: AC
  Administered 2022-07-07: 2 mg via INTRAVENOUS
  Filled 2022-07-07: qty 2

## 2022-07-07 MED ORDER — OXYCODONE HCL 5 MG PO TABS: 5.0000 mg | ORAL_TABLET | ORAL | Status: DC | PRN

## 2022-07-07 MED ORDER — TRANEXAMIC ACID 1000 MG/10ML IV SOLN
2000.0000 mg | INTRAVENOUS | Status: DC
  Filled 2022-07-07: qty 20

## 2022-07-07 MED ORDER — LIDOCAINE 2% (20 MG/ML) 5 ML SYRINGE
INTRAMUSCULAR | Status: DC | PRN
  Administered 2022-07-07: 100 mg via INTRAVENOUS

## 2022-07-07 MED ORDER — ZINC SULFATE 220 (50 ZN) MG PO CAPS
220.0000 mg | ORAL_CAPSULE | Freq: Every day | ORAL | Status: DC
  Administered 2022-07-07 – 2022-07-13 (×7): 220 mg via ORAL
  Filled 2022-07-07 (×7): qty 1

## 2022-07-07 MED ORDER — ACETAMINOPHEN 325 MG PO TABS: 325.0000 mg | ORAL_TABLET | Freq: Four times a day (QID) | ORAL | Status: DC | PRN

## 2022-07-07 MED ORDER — CEFAZOLIN SODIUM-DEXTROSE 2-4 GM/100ML-% IV SOLN: 2.0000 g | Freq: Three times a day (TID) | INTRAVENOUS | Status: DC

## 2022-07-07 MED ORDER — PROPOFOL 10 MG/ML IV BOLUS
INTRAVENOUS | Status: DC | PRN
  Administered 2022-07-07: 200 mg via INTRAVENOUS

## 2022-07-07 MED ORDER — ORAL CARE MOUTH RINSE: 15.0000 mL | Freq: Once | OROMUCOSAL | Status: AC

## 2022-07-07 MED ORDER — ACETAMINOPHEN 500 MG PO TABS
1000.0000 mg | ORAL_TABLET | Freq: Once | ORAL | Status: AC
  Administered 2022-07-07: 1000 mg via ORAL
  Filled 2022-07-07: qty 2

## 2022-07-07 MED ORDER — CLONIDINE HCL (ANALGESIA) 100 MCG/ML EP SOLN
EPIDURAL | Status: DC | PRN
  Administered 2022-07-07: 33 ug
  Administered 2022-07-07: 67 ug

## 2022-07-07 MED ORDER — MIDAZOLAM HCL 2 MG/2ML IJ SOLN: 2.0000 mg | Freq: Once | INTRAMUSCULAR | Status: AC

## 2022-07-07 MED ORDER — ONDANSETRON HCL 4 MG/2ML IJ SOLN
INTRAMUSCULAR | Status: DC | PRN
  Administered 2022-07-07: 4 mg via INTRAVENOUS

## 2022-07-07 MED ORDER — BUPIVACAINE-EPINEPHRINE (PF) 0.5% -1:200000 IJ SOLN
INTRAMUSCULAR | Status: DC | PRN
  Administered 2022-07-07: 20 mL via PERINEURAL
  Administered 2022-07-07: 10 mL via PERINEURAL

## 2022-07-07 MED ORDER — ONDANSETRON HCL 4 MG/2ML IJ SOLN: 4.0000 mg | Freq: Four times a day (QID) | INTRAMUSCULAR | Status: DC | PRN

## 2022-07-07 MED ORDER — PHENOL 1.4 % MT LIQD: 1.0000 | OROMUCOSAL | Status: DC | PRN

## 2022-07-07 MED ORDER — PANTOPRAZOLE SODIUM 40 MG PO TBEC: 40.0000 mg | DELAYED_RELEASE_TABLET | Freq: Every day | ORAL | Status: DC

## 2022-07-07 MED ORDER — PHENYLEPHRINE 80 MCG/ML (10ML) SYRINGE FOR IV PUSH (FOR BLOOD PRESSURE SUPPORT)
PREFILLED_SYRINGE | INTRAVENOUS | Status: AC
  Filled 2022-07-07: qty 30

## 2022-07-07 MED ORDER — CHLORHEXIDINE GLUCONATE 0.12 % MT SOLN
OROMUCOSAL | Status: AC
  Administered 2022-07-07: 15 mL via OROMUCOSAL
  Filled 2022-07-07: qty 15

## 2022-07-07 MED ORDER — MAGNESIUM SULFATE 2 GM/50ML IV SOLN: 2.0000 g | Freq: Every day | INTRAVENOUS | Status: DC | PRN

## 2022-07-07 MED ORDER — LACTATED RINGERS IV SOLN: INTRAVENOUS | Status: DC

## 2022-07-07 MED ORDER — POVIDONE-IODINE 10 % EX SWAB: 2.0000 | Freq: Once | CUTANEOUS | Status: DC

## 2022-07-07 MED ORDER — JUVEN PO PACK
1.0000 | PACK | Freq: Two times a day (BID) | ORAL | Status: DC
Start: 2022-07-08 — End: 2022-07-14
  Administered 2022-07-08 – 2022-07-13 (×10): 1 via ORAL
  Filled 2022-07-07 (×12): qty 1

## 2022-07-07 MED ORDER — SODIUM CHLORIDE 0.9 % IV SOLN
2.0000 g | Freq: Three times a day (TID) | INTRAVENOUS | Status: AC
  Administered 2022-07-07 – 2022-07-08 (×3): 2 g via INTRAVENOUS
  Filled 2022-07-07 (×3): qty 12.5

## 2022-07-07 MED ORDER — HYDROMORPHONE HCL 1 MG/ML IJ SOLN
0.5000 mg | INTRAMUSCULAR | Status: DC | PRN
  Administered 2022-07-08 – 2022-07-10 (×8): 1 mg via INTRAVENOUS
  Filled 2022-07-07 (×9): qty 1

## 2022-07-07 MED ORDER — ONDANSETRON HCL 4 MG/2ML IJ SOLN
INTRAMUSCULAR | Status: AC
  Filled 2022-07-07: qty 2

## 2022-07-07 MED ORDER — CHLORHEXIDINE GLUCONATE CLOTH 2 % EX PADS
6.0000 | MEDICATED_PAD | Freq: Every day | CUTANEOUS | Status: DC
  Administered 2022-07-07 – 2022-07-09 (×3): 6 via TOPICAL

## 2022-07-07 MED ORDER — EPHEDRINE 5 MG/ML INJ
INTRAVENOUS | Status: AC
Start: 2022-07-07 — End: ?
  Filled 2022-07-07: qty 5

## 2022-07-07 MED ORDER — POLYETHYLENE GLYCOL 3350 17 G PO PACK: 17.0000 g | PACK | Freq: Every day | ORAL | Status: DC | PRN

## 2022-07-07 MED ORDER — TRANEXAMIC ACID-NACL 1000-0.7 MG/100ML-% IV SOLN
INTRAVENOUS | Status: DC | PRN
  Administered 2022-07-07: 1000 mg via INTRAVENOUS

## 2022-07-07 MED ORDER — LABETALOL HCL 5 MG/ML IV SOLN: 10.0000 mg | INTRAVENOUS | Status: DC | PRN

## 2022-07-07 MED ORDER — FENTANYL CITRATE (PF) 250 MCG/5ML IJ SOLN
INTRAMUSCULAR | Status: AC
  Filled 2022-07-07: qty 5

## 2022-07-07 MED ORDER — MUPIROCIN 2 % EX OINT
1.0000 | TOPICAL_OINTMENT | Freq: Two times a day (BID) | CUTANEOUS | Status: AC
  Administered 2022-07-07 – 2022-07-11 (×10): 1 via NASAL
  Filled 2022-07-07: qty 22

## 2022-07-07 MED ORDER — HYDROMORPHONE HCL 1 MG/ML IJ SOLN
INTRAMUSCULAR | Status: AC
  Filled 2022-07-07: qty 2

## 2022-07-07 MED ORDER — LIDOCAINE 2% (20 MG/ML) 5 ML SYRINGE
INTRAMUSCULAR | Status: AC
  Filled 2022-07-07: qty 20

## 2022-07-07 MED ORDER — DOCUSATE SODIUM 100 MG PO CAPS
100.0000 mg | ORAL_CAPSULE | Freq: Every day | ORAL | Status: DC
  Administered 2022-07-08 – 2022-07-13 (×5): 100 mg via ORAL
  Filled 2022-07-07 (×6): qty 1

## 2022-07-07 MED ORDER — MAGNESIUM CITRATE PO SOLN: 1.0000 | Freq: Once | ORAL | Status: DC | PRN

## 2022-07-07 MED ORDER — MIDAZOLAM HCL 2 MG/2ML IJ SOLN
INTRAMUSCULAR | Status: DC | PRN
  Administered 2022-07-07: 2 mg via INTRAVENOUS

## 2022-07-07 MED ORDER — SUCCINYLCHOLINE CHLORIDE 200 MG/10ML IV SOSY
PREFILLED_SYRINGE | INTRAVENOUS | Status: AC
  Filled 2022-07-07: qty 10

## 2022-07-07 MED ORDER — GUAIFENESIN-DM 100-10 MG/5ML PO SYRP: 15.0000 mL | ORAL_SOLUTION | ORAL | Status: DC | PRN

## 2022-07-07 MED ORDER — HYDROMORPHONE HCL 1 MG/ML IJ SOLN
0.2500 mg | INTRAMUSCULAR | Status: DC | PRN
  Administered 2022-07-07 (×4): 0.5 mg via INTRAVENOUS

## 2022-07-07 MED ORDER — METOPROLOL TARTRATE 5 MG/5ML IV SOLN: 2.0000 mg | INTRAVENOUS | Status: DC | PRN

## 2022-07-07 MED ORDER — BISACODYL 5 MG PO TBEC: 5.0000 mg | DELAYED_RELEASE_TABLET | Freq: Every day | ORAL | Status: DC | PRN

## 2022-07-07 MED ORDER — FENTANYL CITRATE (PF) 100 MCG/2ML IJ SOLN
INTRAMUSCULAR | Status: DC | PRN
  Administered 2022-07-07: 50 ug via INTRAVENOUS
  Administered 2022-07-07: 100 ug via INTRAVENOUS

## 2022-07-07 MED ORDER — CEFAZOLIN IN SODIUM CHLORIDE 3-0.9 GM/100ML-% IV SOLN
3.0000 g | INTRAVENOUS | Status: DC
  Filled 2022-07-07: qty 100

## 2022-07-07 MED ORDER — 0.9 % SODIUM CHLORIDE (POUR BTL) OPTIME
TOPICAL | Status: DC | PRN
  Administered 2022-07-07: 1000 mL

## 2022-07-07 MED ORDER — CHLORHEXIDINE GLUCONATE 0.12 % MT SOLN: 15.0000 mL | Freq: Once | OROMUCOSAL | Status: AC

## 2022-07-07 SURGICAL SUPPLY — 40 items
BAG COUNTER SPONGE SURGICOUNT (BAG) IMPLANT
BAG SPNG CNTER NS LX DISP (BAG)
BLADE SAW RECIP 87.9 MT (BLADE) ×4 IMPLANT
BLADE SURG 21 STRL SS (BLADE) ×3 IMPLANT
BNDG COHESIVE 6X5 TAN STRL LF (GAUZE/BANDAGES/DRESSINGS) IMPLANT
CANISTER WOUND CARE 500ML ATS (WOUND CARE) ×3 IMPLANT
CANISTER WOUNDNEG PRESSURE 500 (CANNISTER) ×1 IMPLANT
COVER SURGICAL LIGHT HANDLE (MISCELLANEOUS) ×3 IMPLANT
CUFF TOURN SGL QUICK 34 (TOURNIQUET CUFF) ×3
CUFF TRNQT CYL 34X4.125X (TOURNIQUET CUFF) ×2 IMPLANT
DRAPE DERMATAC (DRAPES) ×2 IMPLANT
DRAPE INCISE IOBAN 66X45 STRL (DRAPES) ×3 IMPLANT
DRAPE U-SHAPE 47X51 STRL (DRAPES) ×3 IMPLANT
DRESSING PREVENA PLUS CUSTOM (GAUZE/BANDAGES/DRESSINGS) ×2 IMPLANT
DRSG PREVENA PLUS CUSTOM (GAUZE/BANDAGES/DRESSINGS) ×3
DURAPREP 26ML APPLICATOR (WOUND CARE) ×3 IMPLANT
ELECT REM PT RETURN 9FT ADLT (ELECTROSURGICAL) ×3
ELECTRODE REM PT RTRN 9FT ADLT (ELECTROSURGICAL) ×2 IMPLANT
GLOVE BIOGEL PI IND STRL 9 (GLOVE) ×2 IMPLANT
GLOVE BIOGEL PI INDICATOR 9 (GLOVE) ×1
GLOVE SURG ORTHO 9.0 STRL STRW (GLOVE) ×3 IMPLANT
GOWN STRL REUS W/ TWL XL LVL3 (GOWN DISPOSABLE) ×4 IMPLANT
GOWN STRL REUS W/TWL XL LVL3 (GOWN DISPOSABLE) ×6
KIT BASIN OR (CUSTOM PROCEDURE TRAY) ×3 IMPLANT
KIT TURNOVER KIT B (KITS) ×3 IMPLANT
MANIFOLD NEPTUNE II (INSTRUMENTS) ×3 IMPLANT
NS IRRIG 1000ML POUR BTL (IV SOLUTION) ×3 IMPLANT
PACK ORTHO EXTREMITY (CUSTOM PROCEDURE TRAY) ×3 IMPLANT
PAD ARMBOARD 7.5X6 YLW CONV (MISCELLANEOUS) ×3 IMPLANT
PREVENA RESTOR ARTHOFORM 46X30 (CANNISTER) ×3 IMPLANT
SPONGE T-LAP 18X18 ~~LOC~~+RFID (SPONGE) IMPLANT
STAPLER VISISTAT 35W (STAPLE) IMPLANT
STOCKINETTE IMPERVIOUS LG (DRAPES) ×3 IMPLANT
SUT ETHILON 2 0 PSLX (SUTURE) IMPLANT
SUT SILK 2 0 (SUTURE) ×3
SUT SILK 2-0 18XBRD TIE 12 (SUTURE) ×2 IMPLANT
SUT VIC AB 1 CTX 27 (SUTURE) ×6 IMPLANT
TOWEL GREEN STERILE (TOWEL DISPOSABLE) ×3 IMPLANT
TUBE CONNECTING 12X1/4 (SUCTIONS) ×3 IMPLANT
YANKAUER SUCT BULB TIP NO VENT (SUCTIONS) ×3 IMPLANT

## 2022-07-07 NOTE — Assessment & Plan Note (Signed)
SP CABG and angioplasty in the past.  No active chest pain. Elevation in troponin not related to acute coronary syndrome.  Holding antiplatelet therapy (clopidogrel)  in preparation for surgical intervention.  Continue with carvedilol and aspirin  Holding statin due to high CK.

## 2022-07-07 NOTE — Progress Notes (Signed)
Progress Note   Patient: Joseph [name] YTK:[id number] DOB: 07/25/1971 DOA: 06/30/2022     6 DOS: the patient was seen and examined on 07/07/2022   Brief hospital course: Mr. Bostelman was admitted to the hospital with the working diagnosis of acute metabolic encephalopathy, in the setting of right foot osteomyelitis.   51 yo male with the past medical history of T2DM, coronary artery disease SP CABG, heart failure, hypertension, aortic aneurysm, COPD and peripheral vascular disease who presented with altered mental status. Information limited on admission due to altered mental status. Apparently patient had headache for 3 days, with nausea that evolved into confusion and lethargy. Decreased po intake. On his initial physical examination his blood pressure was 102/63, HR 74, RR 11 and 02 saturation 100%, lungs were clear to auscultation, heart with S1 and S2 present and rhythmic, abdomen soft and distended, not tender, positive bilateral lower extremity edema. Left BKA.  Positive wound in hs right foot. He was very somnolent.   #1 VBG pH 7,16, Pco2 70, PO2 47, bicarbonate 28  #2 VBG pH 7.29, Pc02 50, P02 57 bicarbonate 26   Na 140, K 6,2 Cl 106, bicarbonate 22, glucose 162 bun 44 cr 1,93 anion gap 12  High sensitive troponin 55 and 67  Lactic acid 2.2  Wbc 10,5 hgb 10,0 plt 290   Urine analysis SG 1,024, protein 100. 0-5 wbc  Toxicology positive for opiates and benzodiazepines.   Chest radiograph with no cardiomegaly, bilateral atelectasis at bases more left than right.   EKG 92 bpm, left axis deviation, normal intervals, sinus rhythm, with no significant ST segment or T wave changes.   Right foot x ray with worsening of dorsal midfoot soft tissue swelling. Large heal ulcer which appears worsening.   Patient was placed on Bipap with good toleration  Received IV fluids and broad spectrum antibiotic therapy.  Orthopedics consulted with recommendations for right transtibial amputation.      Assessment and Plan: * Acute metabolic encephalopathy Multifactorial acute metabolic and toxic encephalopathy.   Acute hypercapnic respiratory failure.  Clinically has resolved.  Continue neuro checks per unit protocol.   Osteomyelitis of foot, right, acute (HCC) Foot MRI with mild linear sub periosteal marrow edema about the posterior aspect of the calcaneus which in the presence of adjacent skin ulcer is concerning for early osteomyelitis.   Blood cultures with no growth Wbc is 8,5  Positive MRSA pcr screen  Continue with broad spectrum antibiotic therapy with vancomycin, metronidazole and cefepime.  Pain control with oxycodone as needed.   Plan for transtibial amputation today. Follow up with orthopedics recommendations.   Acute renal failure superimposed on stage 3a chronic kidney disease (HCC) Hyperkalemia. Hypophosphatemia   Renal function has been improving, serum cr today is 1,24. K yesterday was 5.1  Plan to follow up renal function and electrolytes in am. Avoid hypotension and nephrotoxic medications.   (HFpEF) heart failure with preserved ejection fraction (HCC) Preserved LV systolic function with LV EF 55 to 60%.  No signs of acute exacerbation, continue to hold on diuretic therapy for now. Continue blood pressure control.   CAD (coronary artery disease) SP CABG and angioplasty in the past.  No active chest pain. Elevation in troponin not related to acute coronary syndrome.  Holding antiplatelet therapy (clopidogrel)  in preparation for surgical intervention.  Continue with carvedilol and aspirin  Holding statin due to high CK.   DM (diabetes mellitus) with peripheral vascular complication (HCC) Uncontrolled T2DM with Hgb A1c of  7.1  Fasting glucose today is 143  Plan to contin with insulin sliding scale for glucose cover and monitoring Pre meal insulin 3 units.  Basal insulin 15 units bid.  Patient NPO after midnight, close monitoring of  capillary glucose.   Hx of iron deficiency anemia Sp IV iron Hgb has been stable.   Anxiety and depression On Xanax, Wellbutrin, Lexapro, oxycodone 15 mg 5 times a day and Lyrica 300 mg in the morning and 150 mg in the evening at home. Chronic pain syndrome.  -Continue home meds for anxiety and depression except Xanax. -Continue decreased dose of Lyrica 75 mg twice daily -Oxycodone 5 mg every 6 hours as needed for moderate pain -Oxycodone 10 mg every 6 hours as needed for severe pain  COPD (chronic obstructive pulmonary disease) (HCC) No clinical signs of exacerbation.   Essential hypertension Continue blood pressure control with carvedilol.  Blood pressure systolic has been 120 to 140 mmHg.   History of pulmonary embolus (PE) Patient not on chronic anticoagulation.   Obesity, Class III, BMI 40-49.9 (morbid obesity) (HCC) Calculated BMI is 45.1        Subjective: Patient with no chest pain or dyspnea, no confusion or lethargy. Positive pain in his right foot controlled with oral analgesics   Physical Exam: Vitals:   07/07/22 0016 07/07/22 0352 07/07/22 0500 07/07/22 0732  BP: 113/81 140/83  109/72  Pulse: 85 71  74  Resp: 19 20  18   Temp: 98.4 F (36.9 C) 98.8 F (37.1 C)  98.1 F (36.7 C)  TempSrc: Oral Oral  Axillary  SpO2: 98% 96%  96%  Weight:   (!) 172.5 kg   Height:       Neurology awake and alert ENT with mild pallor Cardiovascular with S1 and S2 present and rhythmic with no gallops Respiratory with no wheezing or rales Abdomen not distended Left BKA, right non pitting edema, +, foot with dressing in place,  Data Reviewed:    Family Communication: no family a the bedside   Disposition: Status is: Inpatient Remains inpatient appropriate because: orthopedic surgery and IV antibiotic therapy   Planned Discharge Destination: Home    Author: , MD 07/07/2022 9:19 AM  For on call review [email or web address].

## 2022-07-07 NOTE — Assessment & Plan Note (Signed)
Patient not on chronic anticoagulation.

## 2022-07-07 NOTE — Hospital Course (Addendum)
Joseph [name] was admitted to the hospital with the working diagnosis of acute metabolic encephalopathy, in the setting of right foot osteomyelitis.  Now Sp right transtibial amputation.   51 yo male with the past medical history of T2DM, coronary artery disease SP CABG, heart failure, hypertension, aortic aneurysm, COPD and peripheral vascular disease who presented with altered mental status. Information limited on admission due to altered mental status. Apparently patient had headache for 3 days, with nausea that evolved into confusion and lethargy. Decreased po intake. On his initial physical examination his blood pressure was 102/63, HR 74, RR 11 and 02 saturation 100%, lungs were clear to auscultation, heart with S1 and S2 present and rhythmic, abdomen soft and distended, not tender, positive bilateral lower extremity edema. Left BKA.  Positive wound in hs right foot. He was very somnolent.   #1 VBG pH 7,16, Pco2 70, PO2 47, bicarbonate 28  #2 VBG pH 7.29, Pc02 50, P02 57 bicarbonate 26   Na 140, K 6,2 Cl 106, bicarbonate 22, glucose 162 bun 44 cr 1,93 anion gap 12  High sensitive troponin 55 and 67  Lactic acid 2.2  Wbc 10,5 hgb 10,0 plt 290   Urine analysis SG 1,024, protein 100. 0-5 wbc  Toxicology positive for opiates and benzodiazepines.   Chest radiograph with no cardiomegaly, bilateral atelectasis at bases more left than right.   EKG 92 bpm, left axis deviation, normal intervals, sinus rhythm, with no significant ST segment or T wave changes.   Right foot x ray with worsening of dorsal midfoot soft tissue swelling. Large heal ulcer which appears worsening.   Patient was placed on Bipap with good toleration  Received IV fluids and broad spectrum antibiotic therapy.  Orthopedics consulted with recommendations for right transtibial amputation.   07/19 right below the knee amputation on the right.

## 2022-07-07 NOTE — Assessment & Plan Note (Addendum)
Sp IV iron Mild drop in hgb down to 8.7 consistent with post operative anemia.  Follow up H&H in am.

## 2022-07-07 NOTE — Assessment & Plan Note (Addendum)
Hyperkalemia. Hypophosphatemia   Stable renal function with serum cr at 1,29 with K at 4,7 and serum bicarbonate at 22. Patient is tolerating po well. Check renal function as needed.  He will complete antibiotic therapy today.

## 2022-07-07 NOTE — Assessment & Plan Note (Addendum)
Uncontrolled T2DM with Hgb A1c of 7.1  Fasting glucose today is 223. Capillary 215 and 298  Patient had about 15 units of aspart insulin over last 24 hrs.   Plan to contin with insulin sliding scale for glucose cover and monitoring Pre meal insulin 3 units.  Increase basal insulin 20 units bid.

## 2022-07-07 NOTE — Anesthesia Postprocedure Evaluation (Signed)
Anesthesia Post Note  Patient: Joseph [name]  Procedure(s) Performed: RIGHT BELOW KNEE AMPUTATION (Right: Knee) APPLICATION OF WOUND VAC (Right)     Patient location during evaluation: PACU Anesthesia Type: General Level of consciousness: awake and alert Pain management: pain level controlled Vital Signs Assessment: post-procedure vital signs reviewed and stable Respiratory status: spontaneous breathing, nonlabored ventilation, respiratory function stable and patient connected to nasal cannula oxygen Cardiovascular status: blood pressure returned to baseline and stable Postop Assessment: no apparent nausea or vomiting Anesthetic complications: no   No notable events documented.  Last Vitals:  Vitals:   07/07/22 1715 07/07/22 1748  BP: (!) 116/91 (!) 130/96  Pulse: 79 73  Resp: 16 14  Temp: 36.6 C 36.5 C  SpO2: 96% 98%    Last Pain:  Vitals:   07/07/22 1748  TempSrc: Oral  PainSc:                  Nelle Don Flynn Lininger

## 2022-07-07 NOTE — Assessment & Plan Note (Addendum)
Foot MRI with mild linear sub periosteal marrow edema about the posterior aspect of the calcaneus which in the presence of adjacent skin ulcer is concerning for early osteomyelitis.   Wbc 10,0  Blood cultures with no growth Positive MRSA pcr screen  Continue with broad spectrum antibiotic therapy with vancomycin, metronidazole and cefepime for 24 hrs post surgery.    Patient on oxycodone as needed ever 4 hrs, 5 to 15 mg. For severe pain IV hydromorphone.  Continue with acetaminophen and pregabalin.   Patient has a wound vac. Plan for rehab when medically stable.

## 2022-07-07 NOTE — Transfer of Care (Signed)
Immediate Anesthesia Transfer of Care Note  Patient: Joseph [name]  Procedure(s) Performed: RIGHT BELOW KNEE AMPUTATION (Right: Knee) APPLICATION OF WOUND VAC (Right)  Patient Location: PACU  Anesthesia Type:GA combined with regional for post-op pain  Level of Consciousness: awake and alert   Airway & Oxygen Therapy: Patient Spontanous Breathing  Post-op Assessment: Report given to RN and Post -op Vital signs reviewed and stable  Post vital signs: Reviewed and stable  Last Vitals:  Vitals Value Taken Time  BP 117/83 07/07/22 1631  Temp    Pulse 78 07/07/22 1633  Resp 9 07/07/22 1633  SpO2 90 % 07/07/22 1633  Vitals shown include unvalidated device data.  Last Pain:  Vitals:   07/07/22 1402  TempSrc:   PainSc: 9       Patients Stated Pain Goal: 2 (07/07/22 1402)  Complications: No notable events documented.

## 2022-07-07 NOTE — Progress Notes (Signed)
Orthopedic Tech Progress Note Patient Details:  Joseph [name] 1971/08/25 [id number]  Patient ID: Joseph [name], male   DOB: Mar 26, 1971, 51 y.o.   MRN: [id number] Order placed with [facility] for shrinker.  Darleen Crocker 07/07/2022, 4:56 PM

## 2022-07-07 NOTE — Anesthesia Procedure Notes (Signed)
Procedure Name: LMA Insertion Date/Time: 07/07/2022 3:41 PM  Performed by: Epifanio Lesches, CRNAPre-anesthesia Checklist: Patient identified, Emergency Drugs available, Suction available, Timeout performed and Patient being monitored Patient Re-evaluated:Patient Re-evaluated prior to induction Oxygen Delivery Method: Circle system utilized Preoxygenation: Pre-oxygenation with 100% oxygen Induction Type: IV induction Ventilation: Mask ventilation without difficulty and Oral airway inserted - appropriate to patient size LMA: LMA inserted LMA Size: 5.0 Tube type: Oral Placement Confirmation: positive ETCO2, CO2 detector and breath sounds checked- equal and bilateral Tube secured with: Tape Dental Injury: Teeth and Oropharynx as per pre-operative assessment

## 2022-07-07 NOTE — Assessment & Plan Note (Signed)
Calculated BMI is 45.1

## 2022-07-07 NOTE — Anesthesia Procedure Notes (Signed)
Anesthesia Regional Block: Adductor canal block   Pre-Anesthetic Checklist: , timeout performed,  Correct Patient, Correct Site, Correct Laterality,  Correct Procedure, Correct Position, site marked,  Risks and benefits discussed,  Pre-op evaluation,  At surgeon's request and post-op pain management  Laterality: Right  Prep: Maximum Sterile Barrier Precautions used, chloraprep       Needles:  Injection technique: Single-shot  Needle Type: Echogenic Stimulator Needle     Needle Length: 9cm  Needle Gauge: 22     Additional Needles:   Procedures:,,,, ultrasound used (permanent image in chart),,    Narrative:  Start time: 07/07/2022 3:03 PM End time: 07/07/2022 3:05 PM Injection made incrementally with aspirations every 5 mL.  Performed by: Personally  Anesthesiologist: Kaylyn Layer, MD  Additional Notes: Risks, benefits, and alternative discussed. Patient gave consent for procedure. Patient prepped and draped in sterile fashion. Sedation administered, patient remains easily responsive to voice. Relevant anatomy identified with ultrasound guidance. Local anesthetic given in 5cc increments with no signs or symptoms of intravascular injection. No pain or paraesthesias with injection. Patient monitored throughout procedure with signs of LAST or immediate complications. Tolerated well. Ultrasound image placed in chart.  Joseph Greenhouse, MD

## 2022-07-07 NOTE — Interval H&P Note (Signed)
History and Physical Interval Note:  07/07/2022 6:49 AM  Joseph [name]  has presented today for surgery, with the diagnosis of Gangrene, Osteomyelitis Right Foot.  The various methods of treatment have been discussed with the patient and family. After consideration of risks, benefits and other options for treatment, the patient has consented to  Procedure(s): RIGHT BELOW KNEE AMPUTATION (Right) as a surgical intervention.  The patient's history has been reviewed, patient examined, no change in status, stable for surgery.  I have reviewed the patient's chart and labs.  Questions were answered to the patient's satisfaction.     [name]

## 2022-07-07 NOTE — Anesthesia Procedure Notes (Signed)
Anesthesia Regional Block: Popliteal block   Pre-Anesthetic Checklist: , timeout performed,  Correct Patient, Correct Site, Correct Laterality,  Correct Procedure, Correct Position, site marked,  Risks and benefits discussed,  Pre-op evaluation,  At surgeon's request and post-op pain management  Laterality: Right  Prep: Maximum Sterile Barrier Precautions used, chloraprep       Needles:  Injection technique: Single-shot  Needle Type: Echogenic Stimulator Needle     Needle Length: 9cm  Needle Gauge: 22     Additional Needles:   Procedures:,,,, ultrasound used (permanent image in chart),,    Narrative:  Start time: 07/07/2022 3:06 PM End time: 07/07/2022 3:14 PM Injection made incrementally with aspirations every 5 mL.  Performed by: Personally  Anesthesiologist: Kaylyn Layer, MD  Additional Notes: Risks, benefits, and alternative discussed. Patient gave consent for procedure. Patient prepped and draped in sterile fashion. Sedation administered, patient remains easily responsive to voice. Relevant anatomy identified with ultrasound guidance. Local anesthetic given in 5cc increments with no signs or symptoms of intravascular injection. No pain or paraesthesias with injection. Patient monitored throughout procedure with signs of LAST or immediate complications. Tolerated well. Ultrasound image placed in chart.  Joseph Greenhouse, MD

## 2022-07-07 NOTE — Assessment & Plan Note (Signed)
No clinical signs of exacerbation  

## 2022-07-07 NOTE — Assessment & Plan Note (Addendum)
Multifactorial acute metabolic and toxic encephalopathy.   Acute hypercapnic respiratory failure.  Clinically has resolved.  Continue neuro checks per unit protocol.  Out of bed to chair tid with meals.

## 2022-07-07 NOTE — Assessment & Plan Note (Signed)
Continue blood pressure control with carvedilol.  Blood pressure systolic has been 120 to 140 mmHg.

## 2022-07-07 NOTE — Op Note (Signed)
07/07/2022  4:31 PM  PATIENT:  Joseph [name]    PRE-OPERATIVE DIAGNOSIS:  Gangrene, Osteomyelitis Right Foot  POST-OPERATIVE DIAGNOSIS:  Same  PROCEDURE:  RIGHT BELOW KNEE AMPUTATION, APPLICATION OF WOUND VAC  SURGEON:  [name], MD  ANESTHESIA:   General  PREOPERATIVE INDICATIONS:  [name] is a  51 y.o. male with a diagnosis of Gangrene, Osteomyelitis Right Foot who failed conservative measures and elected for surgical management.    The risks benefits and alternatives were discussed with the patient preoperatively including but not limited to the risks of infection, bleeding, nerve injury, cardiopulmonary complications, the need for revision surgery, among others, and the patient was willing to proceed.  OPERATIVE IMPLANTS: None.   OPERATIVE FINDINGS: Good petechial bleeding.  OPERATIVE PROCEDURE: Patient was brought to the operating room after undergoing a regional anesthetic.  After adequate levels anesthesia were obtained a thigh tourniquet was placed and the lower extremity was prepped using DuraPrep draped into a sterile field. The foot was draped out of the sterile field with impervious stockinette.  A timeout was called and the tourniquet inflated.  A transverse skin incision was made 12 cm distal to the tibial tubercle, the incision curved proximally, and a large posterior flap was created.  The tibia was transected just proximal to the skin incision and beveled anteriorly.  The fibula was transected just proximal to the tibial incision.  The sciatic nerve was pulled cut and allowed to retract.  The vascular bundles were suture ligated with 2-0 silk.  The tourniquet was deflated and hemostasis obtained.  The deep and superficial fascial layers were closed using #1 Vicryl.  The skin was closed using staples.  The Prevena customizable dressing was applied this was overwrapped with the arthroform sponge.  Collier Flowers was used to secure the sponges and the circumferential  compression was secured to the skin with Dermatac.  This was connected to the wound VAC pump and had a good suction fit this was covered with a stump shrinker and a limb protector.  Patient was taken to the PACU in stable condition.   DISCHARGE PLANNING:  Antibiotic duration: 24-hour antibiotics  Weightbearing: Nonweightbearing on the operative extremity  Pain medication: Opioid pathway  Dressing care/ Wound VAC: Continue wound VAC with the Prevena plus pump at discharge for 1 week  Ambulatory devices: Walker or kneeling scooter  Discharge to: Discharge planning based on recommendations per physical therapy  Follow-up: In the office 1 week after discharge.

## 2022-07-07 NOTE — Assessment & Plan Note (Signed)
Preserved LV systolic function with LV EF 55 to 60%.  No signs of acute exacerbation, continue to hold on diuretic therapy for now. Continue blood pressure control.

## 2022-07-07 NOTE — Progress Notes (Signed)
Pharmacy Antibiotic Note-Follow Up  Joseph [name] is a 51 y.o. male for which pharmacy has been consulted for cefepime dosing for  osteomyelitis .  7/19 Vanc peak 39, Vanc trough 10. Calculate AUC 514 (at goal) - although not sure peak is entirely accurate since it was drawn right after the vanc dose ended. SCr down to 1.24  Plan: Flagyl 500mg  IV q12h Cefepime 2g IV q8h Vancomycin 1500mg  IV q24h Will continue to f/u renal function and micro data Levels weekly    Height: 6\' 5"  (195.6 cm) Weight: (!) 172.5 kg (380 lb 4.7 oz) IBW/kg (Calculated) : 89.1  Temp (24hrs), Avg:98.1 F (36.7 C), Min:97.6 F (36.4 C), Max:98.8 F (37.1 C)  Recent Labs  Lab 07/01/22 0259 07/01/22 1451 07/02/22 0225 07/02/22 2054 07/03/22 0325 07/03/22 1919 07/04/22 0146 07/05/22 0446 07/06/22 0454 07/06/22 2022 07/07/22 0537 07/07/22 1903  WBC 9.7  --  7.5  --  6.5  --  7.7 8.5  --   --   --   --   CREATININE 2.57*   < > 2.22*  --  1.44*  --  1.34* 1.14 1.20  --  1.24  --   VANCOTROUGH  --   --   --   --   --  11*  --   --   --   --   --  10*  VANCOPEAK  --   --   --  39  --   --   --   --   --  39  --   --    < > = values in this interval not displayed.     Estimated Creatinine Clearance: 124.9 mL/min (by C-G formula based on SCr of 1.24 mg/dL).    Allergies  Allergen Reactions   Sulfa Antibiotics Anaphylaxis and Other (See Comments)    Headaches     07/08/22, PharmD, BCPS Please see amion for complete clinical pharmacist phone list 07/07/2022 8:57 PM

## 2022-07-07 NOTE — Assessment & Plan Note (Addendum)
Currenlty patient on alprazolam, bupropion, escitalopram, hydroxyzine.  Continue analgesics as above, noted patient has chronic pain syndrome and on chronic opiates as outpatient.

## 2022-07-07 NOTE — Anesthesia Preprocedure Evaluation (Addendum)
Anesthesia Evaluation  Patient identified by MRN, date of birth, ID band Patient awake    Reviewed: Allergy & Precautions, NPO status , Patient's Chart, lab work & pertinent test results, reviewed documented beta blocker date and time   History of Anesthesia Complications Negative for: history of anesthetic complications  Airway Mallampati: II  TM Distance: >3 FB Neck ROM: Full    Dental  (+) Poor Dentition, Missing   Pulmonary asthma , sleep apnea , COPD,  COPD inhaler, former smoker, PE (2014)   Pulmonary exam normal        Cardiovascular hypertension, Pt. on medications and Pt. on home beta blockers + CAD, + Past MI, + Cardiac Stents and + Peripheral Vascular Disease  Normal cardiovascular exam  TTE 01/07/22: EF 55-60%, moderate LVH, grade II DD, bicuspid AV, mild dilatation of aortic root    Neuro/Psych Anxiety Depression Bipolar Disorder negative neurological ROS     GI/Hepatic Neg liver ROS, GERD  Medicated and Controlled,  Endo/Other  diabetes, Type 2, Oral Hypoglycemic Agents, Insulin DependentMorbid obesity  Renal/GU Renal InsufficiencyRenal disease  negative genitourinary   Musculoskeletal  (+) Arthritis ,   Abdominal   Peds  Hematology negative hematology ROS (+)   Anesthesia Other Findings Day of surgery medications reviewed with patient.  Reproductive/Obstetrics negative OB ROS                            Anesthesia Physical Anesthesia Plan  ASA: 3  Anesthesia Plan: General   Post-op Pain Management: Tylenol PO (pre-op)* and Regional block*   Induction: Intravenous  PONV Risk Score and Plan: 2 and Treatment may vary due to age or medical condition, Ondansetron, Dexamethasone and Midazolam  Airway Management Planned: LMA  Additional Equipment: None  Intra-op Plan:   Post-operative Plan: Extubation in OR  Informed Consent: I have reviewed the patients History and  Physical, chart, labs and discussed the procedure including the risks, benefits and alternatives for the proposed anesthesia with the patient or authorized representative who has indicated his/her understanding and acceptance.     Dental advisory given  Plan Discussed with: CRNA  Anesthesia Plan Comments:        Anesthesia Quick Evaluation

## 2022-07-08 ENCOUNTER — Encounter (HOSPITAL_COMMUNITY): Payer: Self-pay | Admitting: Orthopedic Surgery

## 2022-07-08 DIAGNOSIS — N179 Acute kidney failure, unspecified: Secondary | ICD-10-CM | POA: Diagnosis not present

## 2022-07-08 DIAGNOSIS — M86171 Other acute osteomyelitis, right ankle and foot: Secondary | ICD-10-CM | POA: Diagnosis not present

## 2022-07-08 DIAGNOSIS — I25119 Atherosclerotic heart disease of native coronary artery with unspecified angina pectoris: Secondary | ICD-10-CM | POA: Diagnosis not present

## 2022-07-08 DIAGNOSIS — G9341 Metabolic encephalopathy: Secondary | ICD-10-CM | POA: Diagnosis not present

## 2022-07-08 LAB — GLUCOSE, CAPILLARY
Glucose-Capillary: 208 mg/dL — ABNORMAL HIGH (ref 70–99)
Glucose-Capillary: 137 mg/dL — ABNORMAL HIGH (ref 70–99)
Glucose-Capillary: 123 mg/dL — ABNORMAL HIGH (ref 70–99)
Glucose-Capillary: 181 mg/dL — ABNORMAL HIGH (ref 70–99)

## 2022-07-08 LAB — BASIC METABOLIC PANEL
Anion gap: 5 (ref 5–15)
BUN: 21 mg/dL — ABNORMAL HIGH (ref 6–20)
CO2: 22 mmol/L (ref 22–32)
Calcium: 8.1 mg/dL — ABNORMAL LOW (ref 8.9–10.3)
Chloride: 112 mmol/L — ABNORMAL HIGH (ref 98–111)
Creatinine, Ser: 1.29 mg/dL — ABNORMAL HIGH (ref 0.61–1.24)
GFR, Estimated: >60 mL/min (ref >60)
Glucose, Bld: 227 mg/dL — ABNORMAL HIGH (ref 70–99)
Potassium: 4.7 mmol/L (ref 3.5–5.1)
Sodium: 139 mmol/L (ref 135–145)

## 2022-07-08 LAB — CBC
HCT: 29.2 % — ABNORMAL LOW (ref 39.0–52.0)
Hemoglobin: 8.7 g/dL — ABNORMAL LOW (ref 13.0–17.0)
MCH: 24.2 pg — ABNORMAL LOW (ref 26.0–34.0)
MCHC: 29.8 g/dL — ABNORMAL LOW (ref 30.0–36.0)
MCV: 81.1 fL (ref 80.0–100.0)
Platelets: 221 10*3/uL (ref 150–400)
RBC: 3.6 MIL/uL — ABNORMAL LOW (ref 4.22–5.81)
RDW: 18.9 % — ABNORMAL HIGH (ref 11.5–15.5)
WBC: 10 10*3/uL (ref 4.0–10.5)
nRBC: 0 % (ref 0.0–0.2)

## 2022-07-08 MED ORDER — INSULIN GLARGINE-YFGN 100 UNIT/ML ~~LOC~~ SOLN
20.0000 [IU] | Freq: Two times a day (BID) | SUBCUTANEOUS | Status: DC
  Administered 2022-07-08 – 2022-07-13 (×10): 20 [IU] via SUBCUTANEOUS
  Filled 2022-07-08 (×11): qty 0.2

## 2022-07-08 MED ORDER — INSULIN GLARGINE-YFGN 100 UNIT/ML ~~LOC~~ SOLN
5.0000 [IU] | Freq: Once | SUBCUTANEOUS | Status: AC
Start: 2022-07-08 — End: 2022-07-08
  Administered 2022-07-08: 5 [IU] via SUBCUTANEOUS
  Filled 2022-07-08: qty 0.05

## 2022-07-08 NOTE — NC FL2 (Signed)
[place] MEDICAID FL2 LEVEL OF CARE SCREENING TOOL     IDENTIFICATION  Patient Name: Joseph [name] Birthdate: 1971/04/18 Sex: male Admission Date (Current Location): 06/30/2022  [facility] and [place] Number:  Producer, television/film/video and Address:  [facility], [address] [place], [name] [address]      Provider Number: [id number]  Attending Physician Name and Address:  Coralie Keens,*  Relative Name and Phone Number:  Aleksa Catterton [phone]    Current Level of Care: Hospital Recommended Level of Care: Skilled Nursing Facility Prior Approval Number:    Date Approved/Denied:   PASRR Number: [id number]  Discharge Plan: SNF    Current Diagnoses: Patient Active Problem List   Diagnosis Date Noted   Hx of iron deficiency anemia 07/07/2022   Syncope and collapse 06/15/2022   AMS (altered mental status) 04/14/2022   Acute respiratory failure with hypoxia (HCC) 04/14/2022   Acute metabolic encephalopathy 04/14/2022   Pneumonia 04/14/2022   Ulcer of right foot, limited to breakdown of skin (HCC)    Below-knee amputation of left lower extremity (HCC)    Osteomyelitis of foot, right, acute (HCC) 01/06/2022   Bipolar disorder with depression (HCC)    Sepsis (HCC) 11/18/2021   Chest pain 11/18/2021   Dyspnea 11/18/2021   Wound infection    Ulcer of left foot with necrosis of bone (HCC)    Bicuspid aortic valve 09/14/2021   Aneurysm of ascending aorta (HCC) 09/14/2021   Hyperkalemia 08/20/2021   DM (diabetes mellitus) with peripheral vascular complication (HCC) 08/20/2021   Atypical chest pain 07/17/2021   Hyponatremia 07/17/2021   Leg wound, left 07/17/2021   Essential hypertension 07/17/2021   Acute hyperglycemia 07/09/2021   Non-ST elevation (NSTEMI) myocardial infarction (HCC) 07/22/2020   (HFpEF) heart failure with preserved ejection fraction (HCC) 07/22/2020   Hypotension 07/18/2020   Elevated troponin 07/18/2020   Acute  renal failure superimposed on stage 3a chronic kidney disease (HCC) 07/18/2020   Elevated brain natriuretic peptide (BNP) level 07/18/2020   Unstable angina (HCC) 11/01/2019   Edema 06/13/2019   Leukocytosis 05/31/2019   Smoker 03/08/2019   Chronic back pain 02/16/2019   Deep venous thrombosis (HCC) 02/16/2019   Obesity, Class III, BMI 40-49.9 (morbid obesity) (HCC) 02/16/2019   Peripheral nerve disease 02/16/2019   COPD (chronic obstructive pulmonary disease) (HCC) 01/22/2019   Anxiety and depression 03/26/2018   Sinusitis 10/12/2017   Abdominal pain    Loss of weight    Diabetic foot infection (HCC) 09/16/2017   Cellulitis of right foot    Recurrent chest pain 07/29/2016   Hyperglycemia    Insulin dependent type 2 diabetes mellitus (HCC) 04/29/2015   History of pulmonary embolus (PE) 04/27/2013   CAD (coronary artery disease) 09/10/2008    Orientation RESPIRATION BLADDER Height & Weight     Self, Time, Situation  Normal Continent Weight: (!) 172.5 kg Height:  6\' 5"  (195.6 cm)  BEHAVIORAL SYMPTOMS/MOOD NEUROLOGICAL BOWEL NUTRITION STATUS      Continent Diet (See discharge summary)  AMBULATORY STATUS COMMUNICATION OF NEEDS Skin   Extensive Assist Verbally  (Insicion line at amputation site)                       Personal Care Assistance Level of Assistance  Bathing Bathing Assistance: Limited assistance         Functional Limitations Info             SPECIAL CARE FACTORS FREQUENCY  OT (By licensed OT), PT (By licensed PT)     PT Frequency: 5X a week OT Frequency: 5 x week            Contractures Contractures Info: Not present    Additional Factors Info  Code Status Code Status Info: Full Code             Current Medications (07/08/2022):  This is the current hospital active medication list Current Facility-Administered Medications  Medication Dose Route Frequency Provider Last Rate Last Admin   0.9 %  sodium chloride infusion   Intravenous  Continuous [name], MD 75 mL/hr at 07/07/22 2006 Restarted at 07/07/22 2006   acetaminophen (TYLENOL) tablet 650 mg  650 mg Oral Q6H PRN [name], MD   650 mg at 07/06/22 3220   Or   acetaminophen (TYLENOL) suppository 650 mg  650 mg Rectal Q6H PRN [name], MD       albuterol (PROVENTIL) (2.5 MG/3ML) 0.083% nebulizer solution 2.5 mg  2.5 mg Nebulization Q4H PRN [name], MD       ALPRAZolam Prudy Feeler) tablet 1 mg  1 mg Oral QHS PRN [name], MD   1 mg at 07/06/22 2310   alum & mag hydroxide-simeth (MAALOX/MYLANTA) 200-200-20 MG/5ML suspension 15-30 mL  15-30 mL Oral Q2H PRN [name], MD       ascorbic acid (VITAMIN C) tablet 1,000 mg  1,000 mg Oral Daily [name], MD   1,000 mg at 07/08/22 0919   aspirin EC tablet 81 mg  81 mg Oral Daily [name], MD   81 mg at 07/08/22 0920   bisacodyl (DULCOLAX) EC tablet 5 mg  5 mg Oral Daily PRN [name], MD       buPROPion (WELLBUTRIN XL) 24 hr tablet 150 mg  150 mg Oral Daily [name], MD   150 mg at 07/08/22 0920   carvedilol (COREG) tablet 3.125 mg  3.125 mg Oral BID [name], MD   3.125 mg at 07/08/22 0920   ceFEPIme (MAXIPIME) 2 g in sodium chloride 0.9 % 100 mL IVPB  2 g Intravenous Q8H Arrien, York Ram, MD 200 mL/hr at 07/08/22 1439 2 g at 07/08/22 1439   Chlorhexidine Gluconate Cloth 2 % PADS 6 each  6 each Topical Q0600 [name], MD   6 each at 07/08/22 0537   docusate sodium (COLACE) capsule 100 mg  100 mg Oral Daily [name], MD   100 mg at 07/08/22 0920   enoxaparin (LOVENOX) injection 80 mg  80 mg Subcutaneous Q24H [name], MD   80 mg at 07/07/22 2201   escitalopram (LEXAPRO) tablet 10 mg  10 mg Oral q AM [name], MD   10 mg at 07/08/22 0650   guaiFENesin-dextromethorphan (ROBITUSSIN DM) 100-10 MG/5ML syrup 15 mL  15 mL Oral Q4H PRN [name], MD       HYDROmorphone (DILAUDID) injection 0.5-1 mg  0.5-1 mg Intravenous Q4H PRN [name], MD    1 mg at 07/08/22 1247   hydrOXYzine (ATARAX) tablet 10 mg  10 mg Oral TID PRN [name], MD   10 mg at 07/08/22 0156   icosapent Ethyl (VASCEPA) 1 g capsule 2 g  2 g Oral BID [name], MD   2 g at 07/08/22 0921   insulin aspart (novoLOG) injection 0-15 Units  0-15 Units Subcutaneous TID WC  Newt Minion, MD   2 Units at 07/08/22 1243   insulin aspart (novoLOG) injection 0-5 Units  0-5 Units Subcutaneous QHS Newt Minion, MD   2 Units at 07/07/22 2202   insulin aspart (novoLOG) injection 3 Units  3 Units Subcutaneous TID WC Newt Minion, MD   3 Units at 07/08/22 1243   insulin glargine-yfgn (SEMGLEE) injection 20 Units  20 Units Subcutaneous BID Arrien, Jimmy Picket, MD       magnesium citrate solution 1 Bottle  1 Bottle Oral Once PRN Newt Minion, MD       magnesium sulfate IVPB 2 g 50 mL  2 g Intravenous Daily PRN Newt Minion, MD       mupirocin ointment (BACTROBAN) 2 % 1 Application  1 Application Nasal BID Newt Minion, MD   1 Application at [id number]   naloxone (NARCAN) nasal spray 4 mg/0.1 mL  4 mg Nasal PRN Newt Minion, MD       nutrition supplement (JUVEN) (JUVEN) powder packet 1 packet  1 packet Oral BID BM Newt Minion, MD   1 packet at 07/08/22 1432   ondansetron (ZOFRAN) tablet 4 mg  4 mg Oral Q6H PRN Newt Minion, MD   4 mg at 07/05/22 1343   Or   ondansetron (ZOFRAN) injection 4 mg  4 mg Intravenous Q6H PRN Newt Minion, MD   4 mg at 07/06/22 2244   Oral care mouth rinse  15 mL Mouth Rinse PRN Newt Minion, MD       oxyCODONE (Oxy IR/ROXICODONE) immediate release tablet 10-15 mg  10-15 mg Oral Q4H PRN Newt Minion, MD   15 mg at 07/08/22 [id number]   oxyCODONE (Oxy IR/ROXICODONE) immediate release tablet 5-10 mg  5-10 mg Oral Q4H PRN Newt Minion, MD       pantoprazole (PROTONIX) EC tablet 40 mg  40 mg Oral Daily Newt Minion, MD   40 mg at 07/08/22 0921   phenol (CHLORASEPTIC) mouth spray 1 spray  1 spray Mouth/Throat PRN Newt Minion, MD        polyethylene glycol (MIRALAX / GLYCOLAX) packet 17 g  17 g Oral Daily PRN Newt Minion, MD       pregabalin (LYRICA) capsule 75 mg  75 mg Oral BID Newt Minion, MD   75 mg at 07/08/22 0930   ranolazine (RANEXA) 12 hr tablet 500 mg  500 mg Oral BID Newt Minion, MD   500 mg at 07/08/22 0920   senna (SENOKOT) tablet 17.2 mg  2 tablet Oral Daily Newt Minion, MD   17.2 mg at 07/08/22 0919   sodium chloride flush (NS) 0.9 % injection 3 mL  3 mL Intravenous Q12H Newt Minion, MD   3 mL at 07/08/22 1440   zinc sulfate capsule 220 mg  220 mg Oral Daily Newt Minion, MD   220 mg at 07/08/22 [id number]     Discharge Medications: Please see discharge summary for a list of discharge medications.  Relevant Imaging Results:  Relevant Lab Results:   Additional Information SSN [id number]  Verdell Carmine, RN

## 2022-07-08 NOTE — Progress Notes (Signed)
Patient ID: Joseph [name], male   DOB: [birth year], 51 y.o.   MRN: [id number] Patient is postoperative day 1 right below-knee amputation.  Patient without complaints this morning.  There is no drainage in the wound VAC canister there is a good suction fit.  Plan for discharge to rehab.

## 2022-07-08 NOTE — Progress Notes (Signed)
Progress Note   Patient: Joseph [name] GBT:[id number] DOB: 1971/08/15 DOA: 06/30/2022     7 DOS: the patient was seen and examined on 07/08/2022   Brief hospital course: Mr. Demirjian was admitted to the hospital with the working diagnosis of acute metabolic encephalopathy, in the setting of right foot osteomyelitis.  Now Sp right transtibial amputation.   51 yo male with the past medical history of T2DM, coronary artery disease SP CABG, heart failure, hypertension, aortic aneurysm, COPD and peripheral vascular disease who presented with altered mental status. Information limited on admission due to altered mental status. Apparently patient had headache for 3 days, with nausea that evolved into confusion and lethargy. Decreased po intake. On his initial physical examination his blood pressure was 102/63, HR 74, RR 11 and 02 saturation 100%, lungs were clear to auscultation, heart with S1 and S2 present and rhythmic, abdomen soft and distended, not tender, positive bilateral lower extremity edema. Left BKA.  Positive wound in hs right foot. He was very somnolent.   #1 VBG pH 7,16, Pco2 70, PO2 47, bicarbonate 28  #2 VBG pH 7.29, Pc02 50, P02 57 bicarbonate 26   Na 140, K 6,2 Cl 106, bicarbonate 22, glucose 162 bun 44 cr 1,93 anion gap 12  High sensitive troponin 55 and 67  Lactic acid 2.2  Wbc 10,5 hgb 10,0 plt 290   Urine analysis SG 1,024, protein 100. 0-5 wbc  Toxicology positive for opiates and benzodiazepines.   Chest radiograph with no cardiomegaly, bilateral atelectasis at bases more left than right.   EKG 92 bpm, left axis deviation, normal intervals, sinus rhythm, with no significant ST segment or T wave changes.   Right foot x ray with worsening of dorsal midfoot soft tissue swelling. Large heal ulcer which appears worsening.   Patient was placed on Bipap with good toleration  Received IV fluids and broad spectrum antibiotic therapy.  Orthopedics consulted with recommendations  for right transtibial amputation.   07/19 right below the knee amputation on the right.   Assessment and Plan: * Acute metabolic encephalopathy Multifactorial acute metabolic and toxic encephalopathy.   Acute hypercapnic respiratory failure.  Clinically has resolved.  Continue neuro checks per unit protocol.  Out of bed to chair tid with meals.   Osteomyelitis of foot, right, acute (HCC) Foot MRI with mild linear sub periosteal marrow edema about the posterior aspect of the calcaneus which in the presence of adjacent skin ulcer is concerning for early osteomyelitis.   Wbc 10,0  Blood cultures with no growth Positive MRSA pcr screen  Continue with broad spectrum antibiotic therapy with vancomycin, metronidazole and cefepime for 24 hrs post surgery.    Patient on oxycodone as needed ever 4 hrs, 5 to 15 mg. For severe pain IV hydromorphone.  Continue with acetaminophen and pregabalin.   Patient has a wound vac. Plan for rehab when medically stable.   Acute renal failure superimposed on stage 3a chronic kidney disease (HCC) Hyperkalemia. Hypophosphatemia   Stable renal function with serum cr at 1,29 with K at 4,7 and serum bicarbonate at 22. Patient is tolerating po well. Check renal function as needed.  He will complete antibiotic therapy today.   (HFpEF) heart failure with preserved ejection fraction (HCC) Preserved LV systolic function with LV EF 55 to 60%.  No signs of acute exacerbation, continue to hold on diuretic therapy for now. Continue blood pressure control.   CAD (coronary artery disease) SP CABG and angioplasty in the past.  No  active chest pain. Elevation in troponin not related to acute coronary syndrome.  Holding antiplatelet therapy (clopidogrel)  in preparation for surgical intervention.  Continue with carvedilol and aspirin  Holding statin due to high CK.   DM (diabetes mellitus) with peripheral vascular complication (HCC) Uncontrolled T2DM with  Hgb A1c of 7.1  Fasting glucose today is 223. Capillary 215 and 298  Patient had about 15 units of aspart insulin over last 24 hrs.   Plan to contin with insulin sliding scale for glucose cover and monitoring Pre meal insulin 3 units.  Increase basal insulin 20 units bid.    Hx of iron deficiency anemia Sp IV iron Mild drop in hgb down to 8.7 consistent with post operative anemia.  Follow up H&H in am.   Anxiety and depression Currenlty patient on alprazolam, bupropion, escitalopram, hydroxyzine.  Continue analgesics as above, noted patient has chronic pain syndrome and on chronic opiates as outpatient.  COPD (chronic obstructive pulmonary disease) (HCC) No clinical signs of exacerbation.   Essential hypertension Continue blood pressure control with carvedilol.  Blood pressure systolic has been 120 to 140 mmHg.   History of pulmonary embolus (PE) Patient not on chronic anticoagulation.   Obesity, Class III, BMI 40-49.9 (morbid obesity) (HCC) Calculated BMI is 45.1        Subjective: Patient with no chest pain or dyspnea, positive wound pain, controlled with analgesics  Physical Exam: Vitals:   07/07/22 1959 07/08/22 0013 07/08/22 0324 07/08/22 0719  BP: 116/73 131/68 122/78 139/82  Pulse: 80 79 81 78  Resp: 16 20 18 20   Temp: 98.7 F (37.1 C) 99 F (37.2 C) 98.7 F (37.1 C) 98.3 F (36.8 C)  TempSrc: Oral Oral Oral Oral  SpO2: 100% 96% 95% 95%  Weight:      Height:       Neurology awake and alert ENT with no pallor Cardiovascular with S1 and S2 present and rhythmic  Respiratory with  no wheezing Abdomen protuberant but not distended Bilateral BKA  Data Reviewed:    Family Communication: no family at the bedside   Disposition: Status is: Inpatient Remains inpatient appropriate because: post op care, plan to transfer to SNF   Planned Discharge Destination: Skilled nursing facility   \  Author: , MD 07/08/2022 10:02  AM  For on call review [email or web address].

## 2022-07-08 NOTE — Evaluation (Signed)
Physical Therapy Re-Evaluation Patient Details Name: Joseph [name] MRN: [id number] DOB: 11-01-1971 Today's Date: 07/08/2022  History of Present Illness  51 y.o. male Admitted 7/12 for acute metabolic encephalopathy in the setting of acute respiratory failure with hypercarbia and diabetic right foot wound infection and osteomyelitis. Marland Kitchen PMH includes CAD, MI, DM, L BKA, chronic R heel ulcer, COPD, anxiety, depression. S/p Rt BKA 7/19  Clinical Impression  Patient is s/p above surgery resulting in functional limitations due to the deficits listed below (see PT Problem List). Re-evaluated today. Practiced lateral scoot transfers towards left side with min guard +2, min assist with bed mobility. Pain and nausea somewhat prohibiting further training today. Able to donne LLE prosthesis to assist with transfer. Good candidate for AIR with excellent outcomes if admitted. Will continue to follow and progress as tolerated. Patient will benefit from skilled PT to increase their independence and safety with mobility to allow discharge to the venue listed below.        Recommendations for follow up therapy are one component of a multi-disciplinary discharge planning process, led by the attending physician.  Recommendations may be updated based on patient status, additional functional criteria and insurance authorization.  Follow Up Recommendations Acute inpatient rehab (3hours/day)      Assistance Recommended at Discharge Intermittent Supervision/Assistance  Patient can return home with the following  Two people to help with walking and/or transfers;A lot of help with bathing/dressing/bathroom;Assistance with cooking/housework;Assist for transportation;Help with stairs or ramp for entrance    Equipment Recommendations None recommended by PT  Recommendations for Other Services  Rehab consult    Functional Status Assessment Patient has had a recent decline in their functional status and demonstrates the  ability to make significant improvements in function in a reasonable and predictable amount of time.     Precautions / Restrictions Precautions Precautions: Fall;Knee ((amputee Rt BKA)) Precaution Comments: Reviewed precautions, limb protector use. Required Braces or Orthoses: Other Brace Other Brace: limb protector on R and prosthetic on L Restrictions Weight Bearing Restrictions: Yes RLE Weight Bearing: Non weight bearing      Mobility  Bed Mobility Overal bed mobility: Needs Assistance Bed Mobility: Supine to Sit     Supine to sit: Min assist     General bed mobility comments: Increased time and effort, Min A to elevate R LE slowly    Transfers Overall transfer level: Needs assistance Equipment used: None Transfers: Bed to chair/wheelchair/BSC            Lateral/Scoot Transfers: Min guard, +2 safety/equipment General transfer comment: Min Guard A for safety with increased effort. Also requiring to elevate EOB for downward scoot to recliner. Assisted with lines/leads, cues for technique and sequencing. Worse LLE prosthesis to use for leverage when pushing during scoot    Ambulation/Gait                  Stairs            Wheelchair Mobility    Modified Rankin (Stroke Patients Only)       Balance Overall balance assessment: Needs assistance Sitting-balance support: No upper extremity supported, Feet supported Sitting balance-Leahy Scale: Good                                       Pertinent Vitals/Pain Pain Assessment Pain Assessment: Faces Faces Pain Scale: Hurts even more Pain Location: RLE Pain Descriptors / Indicators:  Discomfort, Grimacing, Guarding Pain Intervention(s): Limited activity within patient's tolerance, Monitored during session, Premedicated before session, Repositioned    Home Living Family/patient expects to be discharged to:: Inpatient rehab Living Arrangements: Spouse/significant other;Children;Other  (Comment) (Uncles/aunts) Available Help at Discharge: Family;Available 24 hours/day Type of Home: House Home Access: Ramped entrance       Home Layout: One level Home Equipment: Shower seat;Cane - single point;Rolling Walker (2 wheels);Wheelchair - manual Additional Comments: Staying with an aunt/uncle.    Prior Function Prior Level of Function : Needs assist             Mobility Comments: Prior to amputation, pt reports he hops from bed to wheelchair <10' and then from bathroom door to commode. Also mentions going out into the community some and using w/c. w/c does not fit in bathroom or bedroom ADLs Comments: takes a sponge bath     Hand Dominance   Dominant Hand: Right    Extremity/Trunk Assessment   Upper Extremity Assessment Upper Extremity Assessment: Defer to OT evaluation    Lower Extremity Assessment Lower Extremity Assessment: RLE deficits/detail;LLE deficits/detail RLE Deficits / Details: Limb protector in place RLE: Unable to fully assess due to immobilization;Unable to fully assess due to pain LLE Deficits / Details: Hx of L BKA - wears prosthesis.    Cervical / Trunk Assessment Cervical / Trunk Assessment: Normal  Communication   Communication: No difficulties  Cognition Arousal/Alertness: Awake/alert Behavior During Therapy: WFL for tasks assessed/performed Overall Cognitive Status: Within Functional Limits for tasks assessed                                 General Comments: Motivated despite pain        General Comments General comments (skin integrity, edema, etc.): Discussed positioning for optimal healing    Exercises Amputee Exercises Quad Sets: Strengthening, Right, 5 reps, Seated Gluteal Sets: Strengthening, Both, 10 reps, Seated Hip ABduction/ADduction: AAROM, Strengthening, Right, 5 reps, Seated Straight Leg Raises: Strengthening, Right, AAROM, 5 reps, Seated Chair Push Up: Strengthening, Both, 5 reps, Seated    Assessment/Plan    PT Assessment Patient needs continued PT services  PT Problem List Decreased strength;Decreased activity tolerance;Decreased balance;Decreased mobility;Decreased safety awareness;Decreased knowledge of precautions;Decreased knowledge of use of DME;Obesity;Impaired sensation       PT Treatment Interventions DME instruction;Gait training;Functional mobility training;Therapeutic activities;Therapeutic exercise;Balance training;Neuromuscular re-education;Patient/family education    PT Goals (Current goals can be found in the Care Plan section)  Acute Rehab PT Goals Patient Stated Goal: Get rehab , go home PT Goal Formulation: With patient Time For Goal Achievement: 07/22/22 Potential to Achieve Goals: Good    Frequency Min 4X/week     Co-evaluation PT/OT/SLP Co-Evaluation/Treatment: Yes Reason for Co-Treatment: Complexity of the patient's impairments (multi-system involvement);For patient/therapist safety;To address functional/ADL transfers PT goals addressed during session: Mobility/safety with mobility;Balance;Proper use of DME;Strengthening/ROM OT goals addressed during session: ADL's and self-care       AM-PAC PT "6 Clicks" Mobility  Outcome Measure Help needed turning from your back to your side while in a flat bed without using bedrails?: A Little Help needed moving from lying on your back to sitting on the side of a flat bed without using bedrails?: A Little Help needed moving to and from a bed to a chair (including a wheelchair)?: A Little Help needed standing up from a chair using your arms (e.g., wheelchair or bedside chair)?: Total Help needed to walk  in hospital room?: Total Help needed climbing 3-5 steps with a railing? : Total 6 Click Score: 12    End of Session Equipment Utilized During Treatment: Gait belt Activity Tolerance: Patient tolerated treatment well (Some nausea preventing additional training during encounter) Patient left: with  call bell/phone within reach;in chair;with chair alarm set Nurse Communication: Mobility status (Lateral scoot (spoke with NT)) PT Visit Diagnosis: Unsteadiness on feet (R26.81);History of falling (Z91.81);Difficulty in walking, not elsewhere classified (R26.2);Pain Pain - Right/Left: Right Pain - part of body:  (Residual limb on Rt)    Time: 1025-8527 PT Time Calculation (min) (ACUTE ONLY): 21 min   Charges:   PT Evaluation $PT Re-evaluation: 1 Re-eval          Kathlyn Sacramento, PT   [place] Mount 07/08/2022, 12:36 PM

## 2022-07-08 NOTE — Evaluation (Signed)
Occupational Therapy Re-evaluation Patient Details Name: Joseph [name] MRN: [id number] DOB: [birth year] Today's Date: 07/08/2022   History of Present Illness 51 y.o. male Admitted 7/12 for acute metabolic encephalopathy in the setting of acute respiratory failure with hypercarbia and diabetic right foot wound infection and osteomyelitis. Marland Kitchen PMH includes CAD, MI, DM, L BKA, chronic R heel ulcer, COPD, anxiety, depression.   Clinical Impression   Pt s/p R BKA and continues to present with increased motivation to participate in therapy despite pain.  Pt currently requiring Min Guard A while donning L prosthetic limb at EOB and Min Guard A +2 for lateral scoot towards drop arm recliner. Pt requiring cues and education for sequencing for lateral scoot. Continue to recommend AIR for intensive OT to optimize safety and independence before returning home with family. Will continue to follow acutely as admitted.     Recommendations for follow up therapy are one component of a multi-disciplinary discharge planning process, led by the attending physician.  Recommendations may be updated based on patient status, additional functional criteria and insurance authorization.   Follow Up Recommendations  Acute inpatient rehab (3hours/day)    Assistance Recommended at Discharge Frequent or constant Supervision/Assistance  Patient can return home with the following Two people to help with walking and/or transfers;A lot of help with bathing/dressing/bathroom;Direct supervision/assist for medications management;Direct supervision/assist for financial management;Assist for transportation;Help with stairs or ramp for entrance    Functional Status Assessment     Equipment Recommendations  None recommended by OT;Other (comment)    Recommendations for Other Services PT consult;Rehab consult     Precautions / Restrictions Precautions Precautions: Fall Required Braces or Orthoses: Other Brace Other Brace: limb  protector on R and prosthetic on L Restrictions Weight Bearing Restrictions: Yes RLE Weight Bearing: Non weight bearing      Mobility Bed Mobility Overal bed mobility: Needs Assistance Bed Mobility: Supine to Sit     Supine to sit: Min assist     General bed mobility comments: Increased time and effort, Min A to elevate R LE    Transfers Overall transfer level: Needs assistance   Transfers: Bed to chair/wheelchair/BSC            Lateral/Scoot Transfers: Min guard, +2 safety/equipment General transfer comment: Min Guard A for safety with increased effort. Also requiring to elevate EOB for downward scoot to recliner      Balance Overall balance assessment: Needs assistance Sitting-balance support: No upper extremity supported, Feet supported Sitting balance-Leahy Scale: Good                                     ADL either performed or assessed with clinical judgement   ADL Overall ADL's : Needs assistance/impaired     Grooming: Wash/dry face;Set up;Bed level               Lower Body Dressing: Min guard;Sitting/lateral leans Lower Body Dressing Details (indicate cue type and reason): Min Guard A for safety as pt donned prosthetic limb at EOB. Maintaining RLE on EOB for support and to maintain balance versus allowing it to dangle over EOB Toilet Transfer: Min guard;+2 for safety/equipment (lateral scoot to drop arm recliner)           Functional mobility during ADLs: Min guard General ADL Comments: S/p new R BKA. Focused session on OOB transfer to recliner.     Vision  Perception     Praxis      Pertinent Vitals/Pain Pain Assessment Pain Assessment: Faces Faces Pain Scale: Hurts even more Pain Location: RLE Pain Descriptors / Indicators: Discomfort, Grimacing, Guarding Pain Intervention(s): Limited activity within patient's tolerance, Monitored during session, Repositioned     Hand Dominance     Extremity/Trunk  Assessment Upper Extremity Assessment Upper Extremity Assessment: Overall WFL for tasks assessed   Lower Extremity Assessment Lower Extremity Assessment: Defer to PT evaluation       Communication     Cognition Arousal/Alertness: Awake/alert Behavior During Therapy: WFL for tasks assessed/performed Overall Cognitive Status: Within Functional Limits for tasks assessed                                 General Comments: Motivated despite pain     General Comments       Exercises     Shoulder Instructions      Home Living                                          Prior Functioning/Environment                          OT Problem List:        OT Treatment/Interventions:      OT Goals(Current goals can be found in the care plan section) Acute Rehab OT Goals Patient Stated Goal: Try to go to rehab now that I have bilateral amputations OT Goal Formulation: With patient Time For Goal Achievement: 07/19/22 Potential to Achieve Goals: Good ADL Goals Pt Will Perform Upper Body Dressing: with modified independence;sitting Pt Will Perform Lower Body Dressing: with min assist;sitting/lateral leans;sit to/from stand Pt Will Transfer to Toilet: with mod assist;with +2 assist;with transfer board;bedside commode  OT Frequency: Min 2X/week    Co-evaluation PT/OT/SLP Co-Evaluation/Treatment: Yes Reason for Co-Treatment: For patient/therapist safety;To address functional/ADL transfers   OT goals addressed during session: ADL's and self-care      AM-PAC OT "6 Clicks" Daily Activity     Outcome Measure Help from another person eating meals?: None Help from another person taking care of personal grooming?: None Help from another person toileting, which includes using toliet, bedpan, or urinal?: A Lot Help from another person bathing (including washing, rinsing, drying)?: A Lot Help from another person to put on and taking off regular upper  body clothing?: A Little Help from another person to put on and taking off regular lower body clothing?: A Little 6 Click Score: 18   End of Session Nurse Communication: Mobility status  Activity Tolerance: Patient tolerated treatment well Patient left: in chair;with call bell/phone within reach;with chair alarm set  OT Visit Diagnosis: Unsteadiness on feet (R26.81);Other abnormalities of gait and mobility (R26.89);Muscle weakness (generalized) (M62.81)                Time: 0626-9485 OT Time Calculation (min): 26 min Charges:  OT General Charges $OT Visit: 1 Visit OT Evaluation $OT Re-eval: 1 Re-eval  Sharada Albornoz MSOT, OTR/L Acute Rehab Office: [phone]  Theodoro Grist Scotty Weigelt 07/08/2022, 11:26 AM

## 2022-07-08 NOTE — Progress Notes (Signed)
Inpatient Rehabilitation Admissions Coordinator   Per therapy recommendations patient was screened for CIR candidacy by Ottie Glazier RN MSN. Current payor trends with [facility] medicare are unlikely to approve a CIR/AIR level rehab for this diagnosis. I will not place a Rehab Consult at this time.  Also no Cir bed available for admit this week.Recommend other rehab venues to be pursued.   Ottie Glazier, RN, MSN Rehab Admissions Coordinator [phone] 07/08/2022 1:38 PM

## 2022-07-08 NOTE — TOC Progression Note (Addendum)
Transition of Care [facility]) - Progression Note    Patient Details  Name: Joseph [name] MRN: [id number] Date of Birth: Oct 12, 1971  [facility] CM/SW Contact  Lockie Pares, RN Phone Number: 07/08/2022, 2:26 PM  Clinical Narrative:     Called room phone and cell phone to discuss patient possibly going to other IP rehab facilities. No answer. Messaged RN to inquire about patient availability. Patient is currently on the phone with his wife. Will re-attempt to call in to patient this afternoon.  Sent inquiry out to the availability of beds at [facility].IP rehab. SNF work up complete.  1540 Spoke to patient via phone, he defers decision making to his wife Herbert Seta. Discussed briefly IP rehab vs SNF placement. Herbert Seta feels it would be more advantageous for him to go to IP rehab even it it was farther away. Messaged Jenifer from [facility] with information and she will speak to wife in AM.     Expected Discharge Plan: Home/Self Care Barriers to Discharge: Continued Medical Work up  Expected Discharge Plan and Services Expected Discharge Plan: Home/Self Care       Living arrangements for the past 2 months: Single Family Home                                       Social Determinants of Health (SDOH) Interventions    Readmission Risk Interventions    11/24/2021    4:05 PM 08/20/2021   12:26 PM 07/12/2021   12:29 PM  Readmission Risk Prevention Plan  Transportation Screening Complete Complete Complete  Medication Review Oceanographer) Complete Complete Complete  PCP or Specialist appointment within 3-5 days of discharge Complete Complete Complete  HRI or Home Care Consult Complete Complete Complete  SW Recovery Care/Counseling Consult Complete Complete Complete  Palliative Care Screening Not Applicable Not Applicable Not Applicable  Skilled Nursing Facility Complete Not Applicable Not Applicable

## 2022-07-09 DIAGNOSIS — G9341 Metabolic encephalopathy: Secondary | ICD-10-CM | POA: Diagnosis not present

## 2022-07-09 LAB — GLUCOSE, CAPILLARY
Glucose-Capillary: 148 mg/dL — ABNORMAL HIGH (ref 70–99)
Glucose-Capillary: 209 mg/dL — ABNORMAL HIGH (ref 70–99)
Glucose-Capillary: 191 mg/dL — ABNORMAL HIGH (ref 70–99)
Glucose-Capillary: 160 mg/dL — ABNORMAL HIGH (ref 70–99)

## 2022-07-09 LAB — HEMOGLOBIN AND HEMATOCRIT, BLOOD
HCT: 27.7 % — ABNORMAL LOW (ref 39.0–52.0)
Hemoglobin: 8.4 g/dL — ABNORMAL LOW (ref 13.0–17.0)

## 2022-07-09 MED ORDER — METFORMIN HCL 500 MG PO TABS
1000.0000 mg | ORAL_TABLET | Freq: Two times a day (BID) | ORAL | Status: DC
  Administered 2022-07-09 – 2022-07-13 (×9): 1000 mg via ORAL
  Filled 2022-07-09 (×9): qty 2

## 2022-07-09 NOTE — Plan of Care (Signed)
  Problem: Education: Goal: Understanding of CV disease, CV risk reduction, and recovery process will improve Outcome: Progressing Goal: Individualized Educational Video(s) Outcome: Progressing   Problem: Activity: Goal: Ability to return to baseline activity level will improve Outcome: Progressing   Problem: Cardiovascular: Goal: Ability to achieve and maintain adequate cardiovascular perfusion will improve Outcome: Progressing Goal: Vascular access site(s) Level 0-1 will be maintained Outcome: Progressing   Problem: Health Behavior/Discharge Planning: Goal: Ability to safely manage health-related needs after discharge will improve Outcome: Progressing   Problem: Education: Goal: Ability to describe self-care measures that may prevent or decrease complications (Diabetes Survival Skills Education) will improve Outcome: Progressing Goal: Individualized Educational Video(s) Outcome: Progressing   Problem: Coping: Goal: Ability to adjust to condition or change in health will improve Outcome: Progressing   Problem: Fluid Volume: Goal: Ability to maintain a balanced intake and output will improve Outcome: Progressing   Problem: Health Behavior/Discharge Planning: Goal: Ability to identify and utilize available resources and services will improve Outcome: Progressing Goal: Ability to manage health-related needs will improve Outcome: Progressing   Problem: Metabolic: Goal: Ability to maintain appropriate glucose levels will improve Outcome: Progressing   Problem: Nutritional: Goal: Maintenance of adequate nutrition will improve Outcome: Progressing Goal: Progress toward achieving an optimal weight will improve Outcome: Progressing   Problem: Skin Integrity: Goal: Risk for impaired skin integrity will decrease Outcome: Progressing   Problem: Tissue Perfusion: Goal: Adequacy of tissue perfusion will improve Outcome: Progressing   Problem: Education: Goal: Knowledge  of General Education information will improve Description: Including pain rating scale, medication(s)/side effects and non-pharmacologic comfort measures Outcome: Progressing   Problem: Health Behavior/Discharge Planning: Goal: Ability to manage health-related needs will improve Outcome: Progressing   Problem: Clinical Measurements: Goal: Ability to maintain clinical measurements within normal limits will improve Outcome: Progressing Goal: Will remain free from infection Outcome: Progressing Goal: Diagnostic test results will improve Outcome: Progressing Goal: Respiratory complications will improve Outcome: Progressing Goal: Cardiovascular complication will be avoided Outcome: Progressing   Problem: Activity: Goal: Risk for activity intolerance will decrease Outcome: Progressing   Problem: Nutrition: Goal: Adequate nutrition will be maintained Outcome: Progressing   Problem: Coping: Goal: Level of anxiety will decrease Outcome: Progressing   Problem: Elimination: Goal: Will not experience complications related to bowel motility Outcome: Progressing Goal: Will not experience complications related to urinary retention Outcome: Progressing   Problem: Pain Managment: Goal: General experience of comfort will improve Outcome: Progressing   Problem: Safety: Goal: Ability to remain free from injury will improve Outcome: Progressing   Problem: Skin Integrity: Goal: Risk for impaired skin integrity will decrease Outcome: Progressing   Problem: Education: Goal: Knowledge of the prescribed therapeutic regimen will improve Outcome: Progressing Goal: Ability to verbalize activity precautions or restrictions will improve Outcome: Progressing Goal: Understanding of discharge needs will improve Outcome: Progressing   Problem: Activity: Goal: Ability to perform//tolerate increased activity and mobilize with assistive devices will improve Outcome: Progressing   Problem:  Clinical Measurements: Goal: Postoperative complications will be avoided or minimized Outcome: Progressing   Problem: Self-Care: Goal: Ability to meet self-care needs will improve Outcome: Progressing   Problem: Self-Concept: Goal: Ability to maintain and perform role responsibilities to the fullest extent possible will improve Outcome: Progressing   

## 2022-07-09 NOTE — Progress Notes (Signed)
PROGRESS NOTE  Joseph [name]  DOB: 1971/08/10  PCP: Marva Panda, NP ALP:[id number]  DOA: 06/30/2022  LOS: 8 days  Hospital Day: 10  Brief narrative: Joseph [name] is a 51 y.o. male with PMH significant for morbid obesity, OSA, DM2, HTN, HLD, CAD/CABG/stents, ascending aorta aneurysm, PAD s/p prior left BKA, COPD, DVT/PE, GERD, anxiety/depression, arthritis. 7/12, patient presented to the ED with altered mental status after 3 days of nausea, headache, decreased oral intake, confusion, lethargy. In the ED, he was somnolent and was noted to have a wound in his right foot. VBG showed respiratory acidosis with pH low at 7.16 PCO2 elevated to 70. WBC 10.5, lactic acid elevated 2.2 Urine drug screen positive for opiates and benzodiazepines.   Prior to this admission on 7/6, patient had MRI right heel as an outpatient.  It had shown mild linear subperiosteal marrow edema about the posterior aspect of the calcaneus which in the presence of adjacent skin ulcer is concerning for early osteomyelitis On admission, right foot x ray showed worsening dorsal midfoot soft tissue swelling and worsening large heel ulcer. Admitted to hospital service Required BiPAP initially Treated with IV broad-spectrum antibiotics as well as IV fluid Orthopedic consulted 7/19, patient underwent right BKA.  Subjective: Patient was seen and examined this afternoon.  Pleasant middle-aged Caucasian male.  Looks older for his age.  Propped up in bed.  Not in distress. Chart reviewed Hemodynamically stable in last 24 hours Last set of labs from yesterday with creatinine elevated slightly to 1.29, hemoglobin from this morning running low but stable at 8.4  Assessment and plan: Acute osteomyelitis of right foot -In the setting of diabetes, PAD, prior history of left BKA -Imagings as above -7/19, patient underwent right BKA. -Completed a course of antibiotics.  Currently on none. -Wound VAC on.  Recommendation  per orthopedics. -Pain control with oxycodone as needed, Dilaudid IV as needed for severe pain -Continue Tylenol and Lyrica  Acute metabolic encephalopathy -Initially admitted for acute metabolic encephalopathy likely from combination of infection, use of BZD, opiates  -Mental status normal now.     Acute hypercapnic respiratory failure -Initially was in respiratory failure with PCO2 elevated to 70 requiring BiPAP.   -Gradually improved.  Currently on room air.     AKI on CKD 3a  -Creatinine was elevated as high as 2.46.  Improved to baseline subsequently -Continue to monitor Recent Labs    06/30/22 1318 06/30/22 2318 07/01/22 0259 07/01/22 1451 07/02/22 0225 07/03/22 0325 07/04/22 0146 07/05/22 0446 07/06/22 0454 07/07/22 0537 07/08/22 0345  BUN 44* 48* 47* 48* 50* 36* 28* 19 17  --  21*  CREATININE 1.93* 2.54* 2.57* 2.46* 2.22* 1.44* 1.34* 1.14 1.20 1.24 1.29*   Hyperkalemia/hyperphosphatemia -Related to AKI. Improved subsequently Recent Labs  Lab 07/03/22 0325 07/04/22 0146 07/05/22 0446 07/06/22 0454 07/08/22 0345  K 5.5* 5.3* 4.9 5.1 4.7  MG 1.6* 1.7  --   --   --   PHOS 2.5 2.4*  --   --   --    Type 2 diabetes mellitus -A1c 7.2 on 07/03/2022 -PTA on Lantus, Victoza, metformin 1000 mg twice daily, -Currently Semglee 20 units twice daily, NovoLog Premeal 3 units 3 times daily, sliding scale insulin with Accu-Cheks.  Blood sugar level elevated to over 200 this afternoon.  Resume metformin. Recent Labs  Lab 07/08/22 1127 07/08/22 1633 07/08/22 2152 07/09/22 0846 07/09/22 1220  GLUCAP 137* 123* 181* 148* 209*   Chronic diastolic CHF Essential hypertension -Recent  echo with EF 55 to 60%  -PTA on Coreg, Lasix. -Currently on Coreg 3.125 mg twice daily, Lasix remains on hold.  Patient is euvolemic.  Blood pressure stable. -Currently on IV fluid. We'll stop  IV hydration at this time.   Elevated troponin  CAD/CABG/stents -No anginal symptoms -Initially  had mildly elevated troponin because of demand ischemia -PTA on Coreg, aspirin, Plavix, statin, Ranexa -Currently on aspirin 81 mg daily, -Plan to resume Plavix at discharge.  History of PAD Ascending aortic aneurysm HLD -Statin on hold because of elevated CK.  Repeat CK level tomorrow. -Continue Vascepa Recent Labs  Lab 07/04/22 0146  CKTOTAL 594*   Chronic anemia  Hx of iron deficiency anemia -Hemoglobin chronically between 9 and 10.  Slightly running low postoperatively.  Given IV iron -No active bleeding.  Continue to monitor Recent Labs    04/13/22 2242 04/14/22 0331 07/01/22 0800 07/01/22 0810 07/03/22 0325 07/04/22 0146 07/05/22 0446 07/08/22 0345 07/09/22 0603  HGB  --    < >  --    < > 9.1* 9.5* 9.7* 8.7* 8.4*  MCV  --    < >  --    < > 78.6* 78.0* 79.4* 81.1  --   VITAMINB12 175*  --  209  --  1,654*  --   --   --   --   FOLATE  --   --   --   --  9.9  --   --   --   --   FERRITIN  --   --   --   --  43  --   --   --   --   TIBC  --   --   --   --  252  --   --   --   --   IRON  --   --   --   --  31*  --   --   --   --   RETICCTPCT  --   --   --   --  2.2  --   --   --   --    < > = values in this interval not displayed.   History of DVT PE -Not on chronic anticoagulation.  GERD   Anxiety/depression -Currently patient is on Wellbutrin 150 mg daily, Xanax 1 mg nightly as needed, Lexapro 10 mg daily, Atarax 10 mg 3 times daily as needed, Lyrica 75 mg twice daily    COPD -Stable continue bronchodilators  Morbid obesity  -Body mass index is 45.1 kg/m. Patient has been advised to make an attempt to improve diet and exercise patterns to aid in weight loss.  Goals of care   Code Status: Full Code    Mobility: Rehab recommended  Skin assessment:  Pressure Injury 07/05/22 Foot Right;Posterior Unstageable - Full thickness tissue loss in which the base of the injury is covered by slough (yellow, tan, gray, green or brown) and/or eschar (tan, brown or  black) in the wound bed. yellow, black and red (Active)  07/05/22 0558  Location: Foot  Location Orientation: Right;Posterior  Staging: Unstageable - Full thickness tissue loss in which the base of the injury is covered by slough (yellow, tan, gray, green or brown) and/or eschar (tan, brown or black) in the wound bed.  Wound Description (Comments): yellow, black and red  Present on Admission: Yes    Nutritional status:  Body mass index is 45.1 kg/m.  Diet:  Diet Order             Diet Carb Modified Fluid consistency: Thin; Room service appropriate? Yes  Diet effective now                   DVT prophylaxis: Lovenox subcu    Antimicrobials: None Fluid: None Consultants: Orthopedics Family Communication: Family member at bedside  Status is: Inpatient  Continue in-hospital care because: Pending placement Level of care: Med-Surg.  Start the patient on cardiac telemetry  Dispo: The patient is from: Home              Anticipated d/c is to: Rehab              Patient currently is not medically stable to d/c.   Difficult to place patient No     Infusions:   magnesium sulfate bolus IVPB      Scheduled Meds:  vitamin C  1,000 mg Oral Daily   aspirin EC  81 mg Oral Daily   buPROPion  150 mg Oral Daily   carvedilol  3.125 mg Oral BID   Chlorhexidine Gluconate Cloth  6 each Topical Q0600   docusate sodium  100 mg Oral Daily   enoxaparin (LOVENOX) injection  80 mg Subcutaneous Q24H   escitalopram  10 mg Oral q AM   icosapent Ethyl  2 g Oral BID   insulin aspart  0-15 Units Subcutaneous TID WC   insulin aspart  0-5 Units Subcutaneous QHS   insulin aspart  3 Units Subcutaneous TID WC   insulin glargine-yfgn  20 Units Subcutaneous BID   metFORMIN  1,000 mg Oral BID WC   mupirocin ointment  1 Application Nasal BID   nutrition supplement (JUVEN)  1 packet Oral BID BM   pantoprazole  40 mg Oral Daily   pregabalin  75 mg Oral BID   ranolazine  500 mg Oral  BID   senna  2 tablet Oral Daily   sodium chloride flush  3 mL Intravenous Q12H   zinc sulfate  220 mg Oral Daily    PRN meds: acetaminophen **OR** acetaminophen, albuterol, ALPRAZolam, alum & mag hydroxide-simeth, bisacodyl, guaiFENesin-dextromethorphan, HYDROmorphone (DILAUDID) injection, hydrOXYzine, magnesium citrate, magnesium sulfate bolus IVPB, naloxone, ondansetron **OR** ondansetron (ZOFRAN) IV, mouth rinse, oxyCODONE, oxyCODONE, phenol, polyethylene glycol   Antimicrobials: Anti-infectives (From admission, onward)    Start     Dose/Rate Route Frequency Ordered Stop   07/08/22 0600  ceFAZolin (ANCEF) IVPB 3g/100 mL premix  Status:  Discontinued        3 g 200 mL/hr over 30 Minutes Intravenous On call to O.R. 07/07/22 1350 07/07/22 1737   07/07/22 2000  ceFEPIme (MAXIPIME) 2 g in sodium chloride 0.9 % 100 mL IVPB        2 g 200 mL/hr over 30 Minutes Intravenous Every 8 hours 07/07/22 1957 07/08/22 1530   07/07/22 1845  ceFAZolin (ANCEF) IVPB 2g/100 mL premix  Status:  Discontinued        2 g 200 mL/hr over 30 Minutes Intravenous Every 8 hours 07/07/22 1755 07/07/22 1955   07/01/22 1800  vancomycin (VANCOREADY) IVPB 1500 mg/300 mL  Status:  Discontinued        1,500 mg 150 mL/hr over 120 Minutes Intravenous Every 24 hours 07/01/22 1122 07/08/22 1319   06/30/22 2200  doxycycline (VIBRA-TABS) tablet 100 mg  Status:  Discontinued        100 mg Oral 2 times daily 06/30/22 1924 06/30/22 1946  06/30/22 2100  metroNIDAZOLE (FLAGYL) IVPB 500 mg  Status:  Discontinued        500 mg 100 mL/hr over 60 Minutes Intravenous Every 12 hours 06/30/22 2034 07/08/22 1319   06/30/22 2042  vancomycin variable dose per unstable renal function (pharmacist dosing)  Status:  Discontinued         Does not apply See admin instructions 06/30/22 2042 07/01/22 1122   06/30/22 1700  vancomycin (VANCOCIN) 2,500 mg in sodium chloride 0.9 % 500 mL IVPB        2,500 mg 262.5 mL/hr over 120 Minutes Intravenous   Once 06/30/22 1645 06/30/22 2005   06/30/22 1700  ceFEPIme (MAXIPIME) 2 g in sodium chloride 0.9 % 100 mL IVPB  Status:  Discontinued        2 g 200 mL/hr over 30 Minutes Intravenous Every 8 hours 06/30/22 1646 07/07/22 1957       Objective: Vitals:   07/09/22 0850 07/09/22 1221  BP: 128/86 127/74  Pulse: 73 68  Resp: 18 18  Temp: 97.7 F (36.5 C) 97.8 F (36.6 C)  SpO2: 96% 100%    Intake/Output Summary (Last 24 hours) at 07/09/2022 1421 Last data filed at 07/09/2022 0850 Gross per 24 hour  Intake 325 ml  Output 2200 ml  Net -1875 ml   Filed Weights   06/30/22 1212 07/06/22 0500 07/07/22 0500  Weight: (!) 158.8 kg (!) 172.5 kg (!) 172.5 kg   Weight change:  Body mass index is 45.1 kg/m.   Physical Exam: General exam: Pleasant middle-aged Caucasian male.  Looks older for his age. Skin: No rashes, lesions or ulcers. HEENT: Atraumatic, normocephalic, no obvious bleeding Lungs: Clear to auscultation bilaterally CVS: Regular rate and rhythm, no murmur GI/Abd soft, nontender, nondistended, bowels are present CNS: Alert, awake, oriented x3 Psychiatry: Mood appropriate Extremities: Prior left BKA, new right BKA on wound VAC  Data Review: I have personally reviewed the laboratory data and studies available.  F/u labs ordered Unresulted Labs (From admission, onward)     Start     Ordered   07/10/22 0500  CBC with Differential/Platelet  Tomorrow morning,   R       Question:  Specimen collection method  Answer:  Lab=Lab collect   07/09/22 0736   07/10/22 0500  Basic metabolic panel  Tomorrow morning,   R       Question:  Specimen collection method  Answer:  Lab=Lab collect   07/09/22 0736   07/10/22 0500  CK  Tomorrow morning,   R       Question:  Specimen collection method  Answer:  Lab=Lab collect   07/09/22 0925   07/07/22 0500  Creatinine, serum  (enoxaparin (LOVENOX)    CrCl >/= 30 ml/min)  Weekly,   R     Comments: while on enoxaparin therapy    06/30/22 1924             Signed, Lorin Glass, MD [facility] 07/09/2022

## 2022-07-09 NOTE — TOC Progression Note (Signed)
Transition of Care [facility]) - Progression Note    Patient Details  Name: Joseph [name] MRN: [id number] Date of Birth: 08/26/1971  [facility] CM/SW Contact  Baldemar Lenis, [name] Phone Number: 07/09/2022, 4:09 PM  Clinical Narrative:   CSW updated by [facility]missions that patient has been worked up [employer] authorization has been submitted. CSW to follow.    Expected Discharge Plan: Home/Self Care Barriers to Discharge: Continued Medical Work up  Expected Discharge Plan and Services Expected Discharge Plan: Home/Self Care       Living arrangements for the past 2 months: Single Family Home                                       Social Determinants of Health (SDOH) Interventions    Readmission Risk Interventions    11/24/2021    4:05 PM 08/20/2021   12:26 PM 07/12/2021   12:29 PM  Readmission Risk Prevention Plan  Transportation Screening Complete Complete Complete  Medication Review [employer]ographer) Complete Complete Complete  PCP or Specialist appointment within 3-5 days of discharge Complete Complete Complete  HRI or Home Care Consult Complete Complete Complete  SW Recovery Care/Counseling Consult Complete Complete Complete  Palliative Care Screening Not Applicable Not Applicable Not Applicable  Skilled Nursing Facility Complete Not Applicable Not Applicable

## 2022-07-09 NOTE — Progress Notes (Signed)
PT Cancellation Note  Patient Details Name: Joseph [name] MRN: [id number] DOB: 25-Dec-1970   Cancelled Treatment:    Reason Eval/Treat Not Completed: Pain limiting ability to participate Despite being pre medicated, pt declined working with PT this morning due to increased pain, asking therapist to return later. Will follow if time allows.   Joseph [name] 07/09/2022, 1:11 PM [place], PT, DPT Acute Rehabilitation Services Secure chat preferred Office [phone]

## 2022-07-09 NOTE — Progress Notes (Signed)
Physical Therapy Treatment Patient Details Name: Joseph [name] MRN: [id number] DOB: [birth year] Today's Date: 07/09/2022   History of Present Illness 51 y.o. male Admitted 7/12 for acute metabolic encephalopathy in the setting of acute respiratory failure with hypercarbia and diabetic right foot wound infection and osteomyelitis. Marland Kitchen PMH includes CAD, MI, DM, L BKA, chronic R heel ulcer, COPD, anxiety, depression. S/p Rt BKA 7/19    PT Comments    Patient progressing well towards PT goals. Session focused on transfers and therex of right residual limb. Reports bad pain despite being pre-medicated. Able to perform bed mobility and donn prosthesis with Min guard assist and increased time/effort. Tolerated lateral scooting along side the bed and into chair with Min guard assist as well. Might need some help returning to bed from chair due to having to go uphill. Encouraged pt to have family bring w/c from home to work on w/c mobility and transfers. Discussed importance of pressure relief and getting OOB. Instructed pt in there ex to perform daily. Continues to be appropriate for AIR. Will follow.   Recommendations for follow up therapy are one component of a multi-disciplinary discharge planning process, led by the attending physician.  Recommendations may be updated based on patient status, additional functional criteria and insurance authorization.  Follow Up Recommendations  Acute inpatient rehab (3hours/day)     Assistance Recommended at Discharge Intermittent Supervision/Assistance  Patient can return home with the following Two people to help with walking and/or transfers;A lot of help with bathing/dressing/bathroom;Assistance with cooking/housework;Assist for transportation;Help with stairs or ramp for entrance   Equipment Recommendations  None recommended by PT    Recommendations for Other Services       Precautions / Restrictions Precautions Precautions: Fall Precaution Comments:  Reviewed precautions, limb protector use, old left BKA, new right BKA Required Braces or Orthoses: Other Brace Other Brace: limb protector on R and prosthetic on L Restrictions Weight Bearing Restrictions: Yes RLE Weight Bearing: Non weight bearing     Mobility  Bed Mobility Overal bed mobility: Needs Assistance Bed Mobility: Supine to Sit     Supine to sit: Min guard     General bed mobility comments: Able to come into long sitting wtihout assist or difficulty.    Transfers Overall transfer level: Needs assistance Equipment used: None Transfers: Bed to chair/wheelchair/BSC            Lateral/Scoot Transfers: Min guard General transfer comment: Able to perform lateral scoot from bed to chair towards left side, cues for technique. Wore LLE prosthesis to use for leverage when pushing to scoot. Encouraged using slide board for tx back to bed due to slight uphill trajectory as well as going towards left.    Ambulation/Gait               General Gait Details: Unable   Stairs             Wheelchair Mobility    Modified Rankin (Stroke Patients Only)       Balance Overall balance assessment: Needs assistance Sitting-balance support: Feet unsupported, No upper extremity supported Sitting balance-Leahy Scale: Good Sitting balance - Comments: Able to reach outside BoS to donn prosthesis with increased time and effort needing rest breaks, "i am sweating already."                                    Cognition Arousal/Alertness: Awake/alert Behavior During Therapy: [facility]  for tasks assessed/performed Overall Cognitive Status: Within Functional Limits for tasks assessed                                          Exercises Amputee Exercises Quad Sets: Strengthening, Right, 5 reps, Seated Hip ABduction/ADduction: AROM, Right, 5 reps, Seated Knee Flexion: AAROM, Right, 5 reps, Seated Straight Leg Raises: Strengthening, Right, 5  reps, Seated, AROM Chair Push Up: Strengthening, Both, Seated (x1 due to chest incision pain (hx of CABG))    General Comments General comments (skin integrity, edema, etc.): Removed limb guard while sitting in chair to check skin/residual limb,. encouraged to donn back when pt returns to bed.      Pertinent Vitals/Pain Pain Assessment Pain Assessment: 0-10 Pain Score: 9  Pain Location: RLE Pain Descriptors / Indicators: Discomfort, Grimacing, Guarding Pain Intervention(s): Monitored during session, Premedicated before session, Repositioned, Limited activity within patient's tolerance    Home Living                          Prior Function            PT Goals (current goals can now be found in the care plan section) Progress towards PT goals: Progressing toward goals    Frequency    Min 4X/week      PT Plan Current plan remains appropriate    Co-evaluation              AM-PAC PT "6 Clicks" Mobility   Outcome Measure  Help needed turning from your back to your side while in a flat bed without using bedrails?: A Little Help needed moving from lying on your back to sitting on the side of a flat bed without using bedrails?: A Little Help needed moving to and from a bed to a chair (including a wheelchair)?: A Little Help needed standing up from a chair using your arms (e.g., wheelchair or bedside chair)?: Total Help needed to walk in hospital room?: Total Help needed climbing 3-5 steps with a railing? : Total 6 Click Score: 12    End of Session   Activity Tolerance: Patient tolerated treatment well;Patient limited by pain Patient left: in chair;with call bell/phone within reach;with chair alarm set Nurse Communication: Mobility status (tech and RN about tx technique, lateral scoot towards left) PT Visit Diagnosis: Unsteadiness on feet (R26.81);History of falling (Z91.81);Difficulty in walking, not elsewhere classified (R26.2);Pain Pain - Right/Left:  Right Pain - part of body: Leg     Time: 8366-2947 PT Time Calculation (min) (ACUTE ONLY): 36 min  Charges:  $Therapeutic Exercise: 8-22 mins $Therapeutic Activity: 8-22 mins                     Joseph [name], PT, DPT Acute Rehabilitation Services Secure chat preferred Office [phone]      Blake Divine A Mccrae Speciale 07/09/2022, 4:16 PM

## 2022-07-10 DIAGNOSIS — G9341 Metabolic encephalopathy: Secondary | ICD-10-CM | POA: Diagnosis not present

## 2022-07-10 LAB — CBC WITH DIFFERENTIAL/PLATELET
Abs Immature Granulocytes: 0.14 10*3/uL — ABNORMAL HIGH (ref 0.00–0.07)
Basophils Absolute: 0.1 10*3/uL (ref 0.0–0.1)
Basophils Relative: 1 %
Eosinophils Absolute: 0.3 10*3/uL (ref 0.0–0.5)
Eosinophils Relative: 4 %
HCT: 28.1 % — ABNORMAL LOW (ref 39.0–52.0)
Hemoglobin: 8.5 g/dL — ABNORMAL LOW (ref 13.0–17.0)
Immature Granulocytes: 2 %
Lymphocytes Relative: 27 %
Lymphs Abs: 2.5 10*3/uL (ref 0.7–4.0)
MCH: 24.3 pg — ABNORMAL LOW (ref 26.0–34.0)
MCHC: 30.2 g/dL (ref 30.0–36.0)
MCV: 80.3 fL (ref 80.0–100.0)
Monocytes Absolute: 0.9 10*3/uL (ref 0.1–1.0)
Monocytes Relative: 9 %
Neutro Abs: 5.6 10*3/uL (ref 1.7–7.7)
Neutrophils Relative %: 57 %
Platelets: 242 10*3/uL (ref 150–400)
RBC: 3.5 MIL/uL — ABNORMAL LOW (ref 4.22–5.81)
RDW: 19.9 % — ABNORMAL HIGH (ref 11.5–15.5)
WBC: 9.5 10*3/uL (ref 4.0–10.5)
nRBC: 0 % (ref 0.0–0.2)

## 2022-07-10 LAB — BASIC METABOLIC PANEL
Anion gap: 6 (ref 5–15)
BUN: 39 mg/dL — ABNORMAL HIGH (ref 6–20)
CO2: 22 mmol/L (ref 22–32)
Calcium: 8.5 mg/dL — ABNORMAL LOW (ref 8.9–10.3)
Chloride: 109 mmol/L (ref 98–111)
Creatinine, Ser: 1.29 mg/dL — ABNORMAL HIGH (ref 0.61–1.24)
GFR, Estimated: >60 mL/min (ref >60)
Glucose, Bld: 153 mg/dL — ABNORMAL HIGH (ref 70–99)
Potassium: 5.2 mmol/L — ABNORMAL HIGH (ref 3.5–5.1)
Sodium: 137 mmol/L (ref 135–145)

## 2022-07-10 LAB — GLUCOSE, CAPILLARY
Glucose-Capillary: 134 mg/dL — ABNORMAL HIGH (ref 70–99)
Glucose-Capillary: 190 mg/dL — ABNORMAL HIGH (ref 70–99)
Glucose-Capillary: 247 mg/dL — ABNORMAL HIGH (ref 70–99)
Glucose-Capillary: 165 mg/dL — ABNORMAL HIGH (ref 70–99)

## 2022-07-10 LAB — CK: Total CK: 307 U/L (ref 49–397)

## 2022-07-10 MED ORDER — OXYCODONE HCL 5 MG PO TABS
5.0000 mg | ORAL_TABLET | ORAL | Status: DC | PRN
  Administered 2022-07-10 – 2022-07-11 (×3): 5 mg via ORAL
  Filled 2022-07-10 (×3): qty 1

## 2022-07-10 MED ORDER — CLOPIDOGREL BISULFATE 75 MG PO TABS
75.0000 mg | ORAL_TABLET | Freq: Every day | ORAL | Status: DC
  Administered 2022-07-10 – 2022-07-13 (×4): 75 mg via ORAL
  Filled 2022-07-10 (×4): qty 1

## 2022-07-10 MED ORDER — HYDROMORPHONE HCL 1 MG/ML IJ SOLN
0.5000 mg | INTRAMUSCULAR | Status: DC | PRN
  Filled 2022-07-10: qty 0.5

## 2022-07-10 MED ORDER — HYDROCODONE-ACETAMINOPHEN 10-325 MG PO TABS
1.0000 | ORAL_TABLET | Freq: Four times a day (QID) | ORAL | Status: DC
  Administered 2022-07-10 – 2022-07-11 (×4): 1 via ORAL
  Filled 2022-07-10 (×4): qty 1

## 2022-07-10 NOTE — Plan of Care (Signed)
  Problem: Education: Goal: Understanding of CV disease, CV risk reduction, and recovery process will improve Outcome: Progressing Goal: Individualized Educational Video(s) Outcome: Progressing   Problem: Activity: Goal: Ability to return to baseline activity level will improve Outcome: Progressing   Problem: Health Behavior/Discharge Planning: Goal: Ability to safely manage health-related needs after discharge will improve Outcome: Progressing   Problem: Education: Goal: Ability to describe self-care measures that may prevent or decrease complications (Diabetes Survival Skills Education) will improve Outcome: Progressing Goal: Individualized Educational Video(s) Outcome: Progressing

## 2022-07-10 NOTE — Progress Notes (Signed)
PROGRESS NOTE  Joseph [name]  DOB: 09-01-1971  PCP: Marva Panda, NP VOJ:[id number]  DOA: 06/30/2022  LOS: 9 days  Hospital Day: 11  Brief narrative: Joseph [name] is a 51 y.o. male with PMH significant for morbid obesity, OSA, DM2, HTN, HLD, CAD/CABG/stents, ascending aorta aneurysm, PAD s/p prior left BKA, COPD, DVT/PE, GERD, anxiety/depression, arthritis. 7/12, patient presented to the ED with altered mental status after 3 days of nausea, headache, decreased oral intake, confusion, lethargy. In the ED, he was somnolent and was noted to have a wound in his right foot. VBG showed respiratory acidosis with pH low at 7.16 PCO2 elevated to 70. WBC 10.5, lactic acid elevated 2.2 Urine drug screen positive for opiates and benzodiazepines.   Prior to this admission on 7/6, patient had MRI right heel as an outpatient.  It had shown mild linear subperiosteal marrow edema about the posterior aspect of the calcaneus which in the presence of adjacent skin ulcer is concerning for early osteomyelitis On admission, right foot x ray showed worsening dorsal midfoot soft tissue swelling and worsening large heel ulcer. Admitted to hospital service Required BiPAP initially Treated with IV broad-spectrum antibiotics as well as IV fluid Orthopedic consulted 7/19, patient underwent right BKA.  Subjective: Patient was seen and examined this morning.  Not in distress.  No new symptoms. Labs from this morning with creatinine stable but potassium elevated to 5.2  Assessment and plan: Acute osteomyelitis of right foot -In the setting of diabetes, PAD, prior history of left BKA -Imagings as above -7/19, patient underwent right BKA. -Completed a course of antibiotics.  Currently on none. -Wound VAC on.  Recommendation per orthopedics. -I tuned his pain medicines this morning.  He will be on scheduled Norco 10 mg every 6 hours and as needed oxycodone and Tylenol.  Try to minimize use of IV Dilaudid  as needed. -Continue Lyrica  Acute metabolic encephalopathy -Initially admitted for acute metabolic encephalopathy likely from combination of infection, use of BZD, opiates  -Mental status normal now.     Acute hypercapnic respiratory failure -Initially was in respiratory failure with PCO2 elevated to 70 requiring BiPAP.   -Gradually improved.  Currently on room air.     AKI on CKD 3a  -Creatinine was elevated as high as 2.46.  Improved to baseline subsequently -Continue to monitor Recent Labs    06/30/22 2318 07/01/22 0259 07/01/22 1451 07/02/22 0225 07/03/22 0325 07/04/22 0146 07/05/22 0446 07/06/22 0454 07/07/22 0537 07/08/22 0345 07/10/22 0602  BUN 48* 47* 48* 50* 36* 28* 19 17  --  21* 39*  CREATININE 2.54* 2.57* 2.46* 2.22* 1.44* 1.34* 1.14 1.20 1.24 1.29* 1.29*   Hyperkalemia -Lab from this morning showed elevation in potassium level to 5.2.  Not on regular potassium supplement or potassium sparing diuretic.  Repeat labs tomorrow. Recent Labs  Lab 07/04/22 0146 07/05/22 0446 07/06/22 0454 07/08/22 0345 07/10/22 0602  K 5.3* 4.9 5.1 4.7 5.2*  MG 1.7  --   --   --   --   PHOS 2.4*  --   --   --   --    Type 2 diabetes mellitus -A1c 7.2 on 07/03/2022 -PTA on Lantus, Victoza, metformin 1000 mg twice daily, -Blood sugar is better after metformin was resumed yesterday.  Continue Semglee 20 units twice daily, NovoLog Premeal 3 units 3 times daily, metformin 1000 mg twice daily, sliding scale insulin with Accu-Cheks.. Recent Labs  Lab 07/09/22 [phone] 07/09/22 1220 07/09/22 1636 07/09/22 2054 07/10/22  0620  GLUCAP 148* 209* 191* 160* 134*   Chronic diastolic CHF Essential hypertension -Recent echo with EF 55 to 60%  -PTA on Coreg, Lasix. -Currently heart rate and blood pressure is stable on Coreg 3.125 mg twice daily, Lasix remains on hold.  Patient is euvolemic.  Blood pressure stable.   Elevated troponin  CAD/CABG/stents -No anginal symptoms -Initially  had mildly elevated troponin because of demand ischemia -PTA on Coreg, aspirin, Plavix, statin, Ranexa -Currently on aspirin 81 mg daily. -He is 3 days postop already.  We will resume Plavix today.  History of PAD Ascending aortic aneurysm HLD -Statin on hold because of elevated CK.  Repeat CK level tomorrow. -Continue Vascepa Recent Labs  Lab 07/04/22 0146 07/10/22 0602  CKTOTAL 594* 307   Chronic anemia  Hx of iron deficiency anemia -Hemoglobin chronically between 9 and 10.  Slightly running low postoperatively.  Given IV iron -No active bleeding.  Continue to monitor Recent Labs    04/13/22 2242 04/14/22 0331 07/01/22 0800 07/01/22 0810 07/03/22 0325 07/04/22 0146 07/05/22 0446 07/08/22 0345 07/09/22 0603 07/10/22 0602  HGB  --    < >  --    < > 9.1* 9.5* 9.7* 8.7* 8.4* 8.5*  MCV  --    < >  --    < > 78.6* 78.0* 79.4* 81.1  --  80.3  VITAMINB12 175*  --  209  --  1,654*  --   --   --   --   --   FOLATE  --   --   --   --  9.9  --   --   --   --   --   FERRITIN  --   --   --   --  43  --   --   --   --   --   TIBC  --   --   --   --  252  --   --   --   --   --   IRON  --   --   --   --  31*  --   --   --   --   --   RETICCTPCT  --   --   --   --  2.2  --   --   --   --   --    < > = values in this interval not displayed.   History of DVT PE -Not on chronic anticoagulation.  GERD   Anxiety/depression -Currently patient is on Wellbutrin 150 mg daily, Xanax 1 mg nightly as needed, Lexapro 10 mg daily, Atarax 10 mg 3 times daily as needed, Lyrica 75 mg twice daily    COPD -Stable continue bronchodilators  Morbid obesity  -Body mass index is 45.1 kg/m. Patient has been advised to make an attempt to improve diet and exercise patterns to aid in weight loss.  OSA -Not on CPAP  Goals of care   Code Status: Full Code    Mobility: Rehab recommended  Skin assessment:      Nutritional status:  Body mass index is 45.1 kg/m.          Diet:  Diet  Order             Diet Carb Modified Fluid consistency: Thin; Room service appropriate? Yes  Diet effective now                   DVT  prophylaxis: Lovenox subcu    Antimicrobials: None Fluid: None Consultants: Orthopedics Family Communication: Family member not at bedside  Status is: Inpatient  Continue in-hospital care because: Pending placement Level of care: Med-Surg.  With telemetry  Dispo: The patient is from: Home              Anticipated d/c is to: Rehab              Patient currently is not medically stable to d/c.   Difficult to place patient No     Infusions:   magnesium sulfate bolus IVPB      Scheduled Meds:  vitamin C  1,000 mg Oral Daily   aspirin EC  81 mg Oral Daily   buPROPion  150 mg Oral Daily   carvedilol  3.125 mg Oral BID   Chlorhexidine Gluconate Cloth  6 each Topical Q0600   clopidogrel  75 mg Oral Daily   docusate sodium  100 mg Oral Daily   enoxaparin (LOVENOX) injection  80 mg Subcutaneous Q24H   escitalopram  10 mg Oral q AM   HYDROcodone-acetaminophen  1 tablet Oral Q6H   icosapent Ethyl  2 g Oral BID   insulin aspart  0-15 Units Subcutaneous TID WC   insulin aspart  0-5 Units Subcutaneous QHS   insulin aspart  3 Units Subcutaneous TID WC   insulin glargine-yfgn  20 Units Subcutaneous BID   metFORMIN  1,000 mg Oral BID WC   mupirocin ointment  1 Application Nasal BID   nutrition supplement (JUVEN)  1 packet Oral BID BM   pantoprazole  40 mg Oral Daily   pregabalin  75 mg Oral BID   ranolazine  500 mg Oral BID   senna  2 tablet Oral Daily   sodium chloride flush  3 mL Intravenous Q12H   zinc sulfate  220 mg Oral Daily    PRN meds: acetaminophen **OR** acetaminophen, albuterol, ALPRAZolam, alum & mag hydroxide-simeth, bisacodyl, guaiFENesin-dextromethorphan, HYDROmorphone (DILAUDID) injection, hydrOXYzine, magnesium citrate, magnesium sulfate bolus IVPB, naloxone, ondansetron **OR** ondansetron (ZOFRAN) IV, mouth rinse,  oxyCODONE, phenol, polyethylene glycol   Antimicrobials: Anti-infectives (From admission, onward)    Start     Dose/Rate Route Frequency Ordered Stop   07/08/22 0600  ceFAZolin (ANCEF) IVPB 3g/100 mL premix  Status:  Discontinued        3 g 200 mL/hr over 30 Minutes Intravenous On call to O.R. 07/07/22 1350 07/07/22 1737   07/07/22 2000  ceFEPIme (MAXIPIME) 2 g in sodium chloride 0.9 % 100 mL IVPB        2 g 200 mL/hr over 30 Minutes Intravenous Every 8 hours 07/07/22 1957 07/08/22 1530   07/07/22 1845  ceFAZolin (ANCEF) IVPB 2g/100 mL premix  Status:  Discontinued        2 g 200 mL/hr over 30 Minutes Intravenous Every 8 hours 07/07/22 1755 07/07/22 1955   07/01/22 1800  vancomycin (VANCOREADY) IVPB 1500 mg/300 mL  Status:  Discontinued        1,500 mg 150 mL/hr over 120 Minutes Intravenous Every 24 hours 07/01/22 1122 07/08/22 1319   06/30/22 2200  doxycycline (VIBRA-TABS) tablet 100 mg  Status:  Discontinued        100 mg Oral 2 times daily 06/30/22 1924 06/30/22 1946   06/30/22 2100  metroNIDAZOLE (FLAGYL) IVPB 500 mg  Status:  Discontinued        500 mg 100 mL/hr over 60 Minutes Intravenous Every 12 hours 06/30/22 2034 07/08/22 1319  06/30/22 2042  vancomycin variable dose per unstable renal function (pharmacist dosing)  Status:  Discontinued         Does not apply See admin instructions 06/30/22 2042 07/01/22 1122   06/30/22 1700  vancomycin (VANCOCIN) 2,500 mg in sodium chloride 0.9 % 500 mL IVPB        2,500 mg 262.5 mL/hr over 120 Minutes Intravenous  Once 06/30/22 1645 06/30/22 2005   06/30/22 1700  ceFEPIme (MAXIPIME) 2 g in sodium chloride 0.9 % 100 mL IVPB  Status:  Discontinued        2 g 200 mL/hr over 30 Minutes Intravenous Every 8 hours 06/30/22 1646 07/07/22 1957       Objective: Vitals:   07/10/22 0345 07/10/22 0848  BP: 137/79 130/80  Pulse:  71  Resp: 18 20  Temp: 97.9 F (36.6 C) 97.7 F (36.5 C)  SpO2: 97% 98%   No intake or output data in the 24  hours ending 07/10/22 1153  Filed Weights   06/30/22 1212 07/06/22 0500 07/07/22 0500  Weight: (!) 158.8 kg (!) 172.5 kg (!) 172.5 kg   Weight change:  Body mass index is 45.1 kg/m.   Physical Exam: General exam: Pleasant middle-aged Caucasian male.  Looks older for his age. Skin: No rashes, lesions or ulcers. HEENT: Atraumatic, normocephalic, no obvious bleeding Lungs: Clear to auscultation bilaterally CVS: Regular rate and rhythm, no murmur GI/Abd soft, nontender, nondistended, bowels are present CNS: Alert, awake, oriented x3 Psychiatry: Mood appropriate Extremities: Prior left BKA, new right BKA on wound VAC  Data Review: I have personally reviewed the laboratory data and studies available.  F/u labs ordered Unresulted Labs (From admission, onward)     Start     Ordered   07/11/22 0500  CBC with Differential/Platelet  Tomorrow morning,   R       Question:  Specimen collection method  Answer:  Lab=Lab collect   07/10/22 0738   07/11/22 0500  Basic metabolic panel  Tomorrow morning,   R       Question:  Specimen collection method  Answer:  Lab=Lab collect   07/10/22 0738   07/07/22 0500  Creatinine, serum  (enoxaparin (LOVENOX)    CrCl >/= 30 ml/min)  Weekly,   R     Comments: while on enoxaparin therapy    06/30/22 1924            Signed, Lorin Glass, MD [facility] 07/10/2022

## 2022-07-10 NOTE — Plan of Care (Signed)
  Problem: Education: Goal: Understanding of CV disease, CV risk reduction, and recovery process will improve Outcome: Progressing Goal: Individualized Educational Video(s) Outcome: Progressing   Problem: Activity: Goal: Ability to return to baseline activity level will improve Outcome: Progressing   Problem: Cardiovascular: Goal: Ability to achieve and maintain adequate cardiovascular perfusion will improve Outcome: Progressing Goal: Vascular access site(s) Level 0-1 will be maintained Outcome: Progressing   Problem: Health Behavior/Discharge Planning: Goal: Ability to safely manage health-related needs after discharge will improve Outcome: Progressing   Problem: Education: Goal: Ability to describe self-care measures that may prevent or decrease complications (Diabetes Survival Skills Education) will improve Outcome: Progressing Goal: Individualized Educational Video(s) Outcome: Progressing   Problem: Coping: Goal: Ability to adjust to condition or change in health will improve Outcome: Progressing   Problem: Fluid Volume: Goal: Ability to maintain a balanced intake and output will improve Outcome: Progressing   Problem: Health Behavior/Discharge Planning: Goal: Ability to identify and utilize available resources and services will improve Outcome: Progressing Goal: Ability to manage health-related needs will improve Outcome: Progressing   Problem: Metabolic: Goal: Ability to maintain appropriate glucose levels will improve Outcome: Progressing   Problem: Nutritional: Goal: Maintenance of adequate nutrition will improve Outcome: Progressing Goal: Progress toward achieving an optimal weight will improve Outcome: Progressing   Problem: Skin Integrity: Goal: Risk for impaired skin integrity will decrease Outcome: Progressing   Problem: Tissue Perfusion: Goal: Adequacy of tissue perfusion will improve Outcome: Progressing   Problem: Education: Goal: Knowledge  of General Education information will improve Description: Including pain rating scale, medication(s)/side effects and non-pharmacologic comfort measures Outcome: Progressing   Problem: Health Behavior/Discharge Planning: Goal: Ability to manage health-related needs will improve Outcome: Progressing   Problem: Clinical Measurements: Goal: Ability to maintain clinical measurements within normal limits will improve Outcome: Progressing Goal: Will remain free from infection Outcome: Progressing Goal: Diagnostic test results will improve Outcome: Progressing Goal: Respiratory complications will improve Outcome: Progressing Goal: Cardiovascular complication will be avoided Outcome: Progressing   Problem: Activity: Goal: Risk for activity intolerance will decrease Outcome: Progressing   Problem: Nutrition: Goal: Adequate nutrition will be maintained Outcome: Progressing   Problem: Coping: Goal: Level of anxiety will decrease Outcome: Progressing   Problem: Elimination: Goal: Will not experience complications related to bowel motility Outcome: Progressing Goal: Will not experience complications related to urinary retention Outcome: Progressing   Problem: Pain Managment: Goal: General experience of comfort will improve Outcome: Progressing   Problem: Safety: Goal: Ability to remain free from injury will improve Outcome: Progressing   Problem: Skin Integrity: Goal: Risk for impaired skin integrity will decrease Outcome: Progressing   Problem: Education: Goal: Knowledge of the prescribed therapeutic regimen will improve Outcome: Progressing Goal: Ability to verbalize activity precautions or restrictions will improve Outcome: Progressing Goal: Understanding of discharge needs will improve Outcome: Progressing   Problem: Activity: Goal: Ability to perform//tolerate increased activity and mobilize with assistive devices will improve Outcome: Progressing   Problem:  Clinical Measurements: Goal: Postoperative complications will be avoided or minimized Outcome: Progressing   Problem: Self-Care: Goal: Ability to meet self-care needs will improve Outcome: Progressing   Problem: Self-Concept: Goal: Ability to maintain and perform role responsibilities to the fullest extent possible will improve Outcome: Progressing

## 2022-07-11 DIAGNOSIS — G9341 Metabolic encephalopathy: Secondary | ICD-10-CM | POA: Diagnosis not present

## 2022-07-11 LAB — GLUCOSE, CAPILLARY
Glucose-Capillary: 129 mg/dL — ABNORMAL HIGH (ref 70–99)
Glucose-Capillary: 180 mg/dL — ABNORMAL HIGH (ref 70–99)
Glucose-Capillary: 195 mg/dL — ABNORMAL HIGH (ref 70–99)
Glucose-Capillary: 177 mg/dL — ABNORMAL HIGH (ref 70–99)
Glucose-Capillary: 160 mg/dL — ABNORMAL HIGH (ref 70–99)

## 2022-07-11 LAB — CBC WITH DIFFERENTIAL/PLATELET
Abs Immature Granulocytes: 0.11 10*3/uL — ABNORMAL HIGH (ref 0.00–0.07)
Basophils Absolute: 0.1 10*3/uL (ref 0.0–0.1)
Basophils Relative: 1 %
Eosinophils Absolute: 0.4 10*3/uL (ref 0.0–0.5)
Eosinophils Relative: 4 %
HCT: 29 % — ABNORMAL LOW (ref 39.0–52.0)
Hemoglobin: 8.8 g/dL — ABNORMAL LOW (ref 13.0–17.0)
Immature Granulocytes: 1 %
Lymphocytes Relative: 27 %
Lymphs Abs: 2.7 10*3/uL (ref 0.7–4.0)
MCH: 24.2 pg — ABNORMAL LOW (ref 26.0–34.0)
MCHC: 30.3 g/dL (ref 30.0–36.0)
MCV: 79.9 fL — ABNORMAL LOW (ref 80.0–100.0)
Monocytes Absolute: 0.9 10*3/uL (ref 0.1–1.0)
Monocytes Relative: 8 %
Neutro Abs: 6 10*3/uL (ref 1.7–7.7)
Neutrophils Relative %: 59 %
Platelets: 264 10*3/uL (ref 150–400)
RBC: 3.63 MIL/uL — ABNORMAL LOW (ref 4.22–5.81)
RDW: 19.6 % — ABNORMAL HIGH (ref 11.5–15.5)
WBC: 10.1 10*3/uL (ref 4.0–10.5)
nRBC: 0 % (ref 0.0–0.2)

## 2022-07-11 LAB — BASIC METABOLIC PANEL
Anion gap: 5 (ref 5–15)
BUN: 40 mg/dL — ABNORMAL HIGH (ref 6–20)
CO2: 23 mmol/L (ref 22–32)
Calcium: 8.8 mg/dL — ABNORMAL LOW (ref 8.9–10.3)
Chloride: 112 mmol/L — ABNORMAL HIGH (ref 98–111)
Creatinine, Ser: 1.12 mg/dL (ref 0.61–1.24)
GFR, Estimated: >60 mL/min (ref >60)
Glucose, Bld: 152 mg/dL — ABNORMAL HIGH (ref 70–99)
Potassium: 4.9 mmol/L (ref 3.5–5.1)
Sodium: 140 mmol/L (ref 135–145)

## 2022-07-11 MED ORDER — HYDROCODONE-ACETAMINOPHEN 10-325 MG PO TABS
1.0000 | ORAL_TABLET | Freq: Three times a day (TID) | ORAL | Status: DC
  Administered 2022-07-11 – 2022-07-12 (×3): 1 via ORAL
  Filled 2022-07-11 (×3): qty 1

## 2022-07-11 MED ORDER — ATORVASTATIN CALCIUM 80 MG PO TABS
80.0000 mg | ORAL_TABLET | Freq: Every day | ORAL | Status: DC
  Administered 2022-07-11 – 2022-07-12 (×2): 80 mg via ORAL
  Filled 2022-07-11 (×2): qty 1

## 2022-07-11 NOTE — Progress Notes (Signed)
Occupational Therapy Treatment Patient Details Name: Joseph [name] MRN: [id number] DOB: [birth year] Today's Date: 07/11/2022   History of present illness 51 y.o. male Admitted 7/12 for acute metabolic encephalopathy in the setting of acute respiratory failure with hypercarbia and diabetic right foot wound infection and osteomyelitis. Marland Kitchen PMH includes CAD, MI, DM, L BKA, chronic R heel ulcer, COPD, anxiety, depression. S/p Rt BKA 7/19   OT comments  Pt deferred OOB or EOB activity this date. He was issued green theraband and was instructed in initial HEP for bil. UE strengthening.      Recommendations for follow up therapy are one component of a multi-disciplinary discharge planning process, led by the attending physician.  Recommendations may be updated based on patient status, additional functional criteria and insurance authorization.    Follow Up Recommendations  Acute inpatient rehab (3hours/day)    Assistance Recommended at Discharge Frequent or constant Supervision/Assistance  Patient can return home with the following  Two people to help with walking and/or transfers;A lot of help with bathing/dressing/bathroom;Direct supervision/assist for medications management;Direct supervision/assist for financial management;Assist for transportation;Help with stairs or ramp for entrance   Equipment Recommendations  None recommended by OT;Other (comment)    Recommendations for Other Services      Precautions / Restrictions Precautions Precautions: Fall       Mobility Bed Mobility                    Transfers                         Balance                                           ADL either performed or assessed with clinical judgement   ADL                                         General ADL Comments: pt deferred EOB or OOB activity this date despite encouragement - reports he was fatigued from being up yesterday     Extremity/Trunk Assessment Upper Extremity Assessment Upper Extremity Assessment: Generalized weakness   Lower Extremity Assessment Lower Extremity Assessment: Defer to PT evaluation        Vision       Perception     Praxis      Cognition Arousal/Alertness: Awake/alert Behavior During Therapy: WFL for tasks assessed/performed Overall Cognitive Status: No family/caregiver present to determine baseline cognitive functioning                                 General Comments: Pt with decreased safety awareness and insight - anticipate this may be his baseline.        Exercises Exercises: Other exercises Other Exercises Other Exercises: pt performed horizontal abduction x 15 bil. UEs green theraband Other Exercises: 15 reps shoulder flexion and 15 reps shoulder extension green theraband Lt and Rt UE Other Exercises: Chest press x 15 with green theraband each UE Other Exercises: Pt instructed in safe use of theraband and was able to verbalize understanding    Shoulder Instructions       General Comments      Pertinent Vitals/ Pain  Pain Assessment Pain Assessment: Faces Faces Pain Scale: No hurt  Home Living                                          Prior Functioning/Environment              Frequency  Min 2X/week        Progress Toward Goals  OT Goals(current goals can now be found in the care plan section)  Progress towards OT goals: Progressing toward goals     Plan Discharge plan remains appropriate    Co-evaluation                 AM-PAC OT "6 Clicks" Daily Activity     Outcome Measure   Help from another person eating meals?: None Help from another person taking care of personal grooming?: None Help from another person toileting, which includes using toliet, bedpan, or urinal?: A Lot Help from another person bathing (including washing, rinsing, drying)?: A Lot Help from another person to put  on and taking off regular upper body clothing?: A Little Help from another person to put on and taking off regular lower body clothing?: A Little 6 Click Score: 18    End of Session    OT Visit Diagnosis: Unsteadiness on feet (R26.81);Other abnormalities of gait and mobility (R26.89);Muscle weakness (generalized) (M62.81)   Activity Tolerance Patient tolerated treatment well   Patient Left in bed;with call bell/phone within reach   Nurse Communication Mobility status        Time: 3149-7026 OT Time Calculation (min): 19 min  Charges: OT General Charges $OT Visit: 1 Visit OT Treatments $Therapeutic Exercise: 8-22 mins  Eber Jones., OTR/L Acute Rehabilitation Services Pager [phone] Office [phone]   Boykin Reaper 07/11/2022, 3:17 PM

## 2022-07-11 NOTE — Progress Notes (Signed)
PROGRESS NOTE  Joseph [name]  DOB: [birth year]  PCP: Marva Panda, NP AVW:[id number]  DOA: 06/30/2022  LOS: 10 days  Hospital Day: 12  Brief narrative: Joseph [name] is a 51 y.o. male with PMH significant for morbid obesity, OSA, DM2, HTN, HLD, CAD/CABG/stents, ascending aorta aneurysm, PAD s/p prior left BKA, COPD, DVT/PE, GERD, anxiety/depression, arthritis. 7/12, patient presented to the ED with altered mental status after 3 days of nausea, headache, decreased oral intake, confusion, lethargy. In the ED, he was somnolent and was noted to have a wound in his right foot. VBG showed respiratory acidosis with pH low at 7.16 PCO2 elevated to 70. WBC 10.5, lactic acid elevated 2.2 Urine drug screen positive for opiates and benzodiazepines.   Prior to this admission on 7/6, patient had MRI right heel as an outpatient.  It had shown mild linear subperiosteal marrow edema about the posterior aspect of the calcaneus which in the presence of adjacent skin ulcer is concerning for early osteomyelitis On admission, right foot x ray showed worsening dorsal midfoot soft tissue swelling and worsening large heel ulcer. Admitted to hospital service Required BiPAP initially Treated with IV broad-spectrum antibiotics as well as IV fluid Orthopedic consulted 7/19, patient underwent right BKA.  Subjective: Patient was seen and examined this morning.  Drowsy.  Sleeping in the middle of conversation.  Not in pain.  In the last 24 hours, nurse reported that patient would wake up and ask for Dilaudid and fall back asleep within few minutes.  Assessment and plan: Acute osteomyelitis of right foot -In the setting of diabetes, PAD, prior history of left BKA -Imagings as above -7/19, patient underwent right BKA. -Completed a course of antibiotics.  Currently on none. -Wound VAC on.  Recommendation per orthopedics. -He is currently on scheduled Norco 10 mg every 6 hours and as needed oxycodone and  Tylenol.  IV Dilaudid was stopped yesterday.  I would reduce Norco 10/325 mg from every 6 hours to every 8 hours.  -Continue Lyrica  Acute metabolic encephalopathy -Initially admitted for acute metabolic encephalopathy likely from combination of infection, use of BZD, opiates  -Mental status normal now.     Acute hypercapnic respiratory failure -Initially was in respiratory failure with PCO2 elevated to 70 requiring BiPAP.   -Gradually improved.  Currently on room air.     AKI on CKD 3a  -Creatinine was elevated as high as 2.46.  Improved to baseline subsequently -Continue to monitor Recent Labs    07/01/22 0259 07/01/22 1451 07/02/22 0225 07/03/22 0325 07/04/22 0146 07/05/22 0446 07/06/22 0454 07/07/22 0537 07/08/22 0345 07/10/22 0602 07/11/22 0406  BUN 47* 48* 50* 36* 28* 19 17  --  21* 39* 40*  CREATININE 2.57* 2.46* 2.22* 1.44* 1.34* 1.14 1.20 1.24 1.29* 1.29* 1.12   Hyperkalemia -Potassium level improved on repeat labs this morning. Recent Labs  Lab 07/05/22 0446 07/06/22 0454 07/08/22 0345 07/10/22 0602 07/11/22 0406  K 4.9 5.1 4.7 5.2* 4.9   Type 2 diabetes mellitus -A1c 7.2 on 07/03/2022 -PTA on Lantus, Victoza, metformin 1000 mg twice daily, -Blood sugar is better after metformin was resumed on 7/21.  Continue Semglee 20 units twice daily, NovoLog Premeal 3 units 3 times daily, metformin 1000 mg twice daily, sliding scale insulin with Accu-Cheks.. Recent Labs  Lab 07/10/22 1206 07/10/22 1852 07/10/22 2209 07/11/22 0700 07/11/22 0908  GLUCAP 190* 247* 165* 129* 180*   Chronic diastolic CHF Essential hypertension -Recent echo with EF 55 to 60%  -PTA on  Coreg, Lasix. -Currently heart rate and blood pressure is stable on Coreg 3.125 mg twice daily, Lasix remains on hold.  Patient is euvolemic.  Blood pressure stable.   Elevated troponin  CAD/CABG/stents -No anginal symptoms -Initially had mildly elevated troponin because of demand ischemia -PTA on  Coreg, aspirin, Plavix, statin, Ranexa -Currently on aspirin 81 mg daily.  Plavix was resumed on 7/22.  History of PAD Ascending aortic aneurysm HLD -Statin on hold because of elevated CK.  CK level improved on recheck. -Continue Vascepa.  Resume statin.  Chronic anemia  Hx of iron deficiency anemia -Hemoglobin chronically between 9 and 10.  Slightly running low postoperatively.  Given IV iron -No active bleeding.  Continue to monitor Recent Labs    04/13/22 2242 04/14/22 0331 07/01/22 0800 07/01/22 0810 07/03/22 0325 07/04/22 0146 07/05/22 0446 07/08/22 0345 07/09/22 0603 07/10/22 0602 07/11/22 0406  HGB  --    < >  --    < > 9.1*   < > 9.7* 8.7* 8.4* 8.5* 8.8*  MCV  --    < >  --    < > 78.6*   < > 79.4* 81.1  --  80.3 79.9*  VITAMINB12 175*  --  209  --  1,654*  --   --   --   --   --   --   FOLATE  --   --   --   --  9.9  --   --   --   --   --   --   FERRITIN  --   --   --   --  43  --   --   --   --   --   --   TIBC  --   --   --   --  252  --   --   --   --   --   --   IRON  --   --   --   --  31*  --   --   --   --   --   --   RETICCTPCT  --   --   --   --  2.2  --   --   --   --   --   --    < > = values in this interval not displayed.   History of DVT PE -Not on chronic anticoagulation.  GERD   Anxiety/depression -Currently patient is on Wellbutrin 150 mg daily, Xanax 1 mg nightly as needed, Lexapro 10 mg daily, Atarax 10 mg 3 times daily as needed, Lyrica 75 mg twice daily.  Counseled to minimize use of mood altering medications.  Follow-up with psychiatry as an outpatient   COPD -Stable continue bronchodilators  Morbid obesity  -Body mass index is 45.1 kg/m. Patient has been advised to make an attempt to improve diet and exercise patterns to aid in weight loss.  OSA -Not on CPAP  Goals of care   Code Status: Full Code    Mobility: Rehab recommended  Skin assessment:      Nutritional status:  Body mass index is 45.1 kg/m.           Diet:  Diet Order             Diet Carb Modified Fluid consistency: Thin; Room service appropriate? Yes  Diet effective now  DVT prophylaxis: Lovenox subcu    Antimicrobials: None Fluid: None Consultants: Orthopedics Family Communication: Family member not at bedside  Status is: Inpatient  Continue in-hospital care because: Pending placement Level of care: Med-Surg.  With telemetry  Dispo: The patient is from: Home              Anticipated d/c is to: Rehab              Patient currently is not medically stable to d/c.   Difficult to place patient No     Infusions:   magnesium sulfate bolus IVPB      Scheduled Meds:  vitamin C  1,000 mg Oral Daily   aspirin EC  81 mg Oral Daily   atorvastatin  80 mg Oral QHS   buPROPion  150 mg Oral Daily   carvedilol  3.125 mg Oral BID   clopidogrel  75 mg Oral Daily   docusate sodium  100 mg Oral Daily   enoxaparin (LOVENOX) injection  80 mg Subcutaneous Q24H   escitalopram  10 mg Oral q AM   HYDROcodone-acetaminophen  1 tablet Oral Q8H   icosapent Ethyl  2 g Oral BID   insulin aspart  0-15 Units Subcutaneous TID WC   insulin aspart  0-5 Units Subcutaneous QHS   insulin aspart  3 Units Subcutaneous TID WC   insulin glargine-yfgn  20 Units Subcutaneous BID   metFORMIN  1,000 mg Oral BID WC   nutrition supplement (JUVEN)  1 packet Oral BID BM   pantoprazole  40 mg Oral Daily   pregabalin  75 mg Oral BID   ranolazine  500 mg Oral BID   senna  2 tablet Oral Daily   sodium chloride flush  3 mL Intravenous Q12H   zinc sulfate  220 mg Oral Daily    PRN meds: acetaminophen **OR** acetaminophen, albuterol, ALPRAZolam, alum & mag hydroxide-simeth, bisacodyl, guaiFENesin-dextromethorphan, hydrOXYzine, magnesium citrate, magnesium sulfate bolus IVPB, naloxone, ondansetron **OR** ondansetron (ZOFRAN) IV, mouth rinse, oxyCODONE, phenol, polyethylene glycol   Antimicrobials: Anti-infectives (From  admission, onward)    Start     Dose/Rate Route Frequency Ordered Stop   07/08/22 0600  ceFAZolin (ANCEF) IVPB 3g/100 mL premix  Status:  Discontinued        3 g 200 mL/hr over 30 Minutes Intravenous On call to O.R. 07/07/22 1350 07/07/22 1737   07/07/22 2000  ceFEPIme (MAXIPIME) 2 g in sodium chloride 0.9 % 100 mL IVPB        2 g 200 mL/hr over 30 Minutes Intravenous Every 8 hours 07/07/22 1957 07/08/22 1530   07/07/22 1845  ceFAZolin (ANCEF) IVPB 2g/100 mL premix  Status:  Discontinued        2 g 200 mL/hr over 30 Minutes Intravenous Every 8 hours 07/07/22 1755 07/07/22 1955   07/01/22 1800  vancomycin (VANCOREADY) IVPB 1500 mg/300 mL  Status:  Discontinued        1,500 mg 150 mL/hr over 120 Minutes Intravenous Every 24 hours 07/01/22 1122 07/08/22 1319   06/30/22 2200  doxycycline (VIBRA-TABS) tablet 100 mg  Status:  Discontinued        100 mg Oral 2 times daily 06/30/22 1924 06/30/22 1946   06/30/22 2100  metroNIDAZOLE (FLAGYL) IVPB 500 mg  Status:  Discontinued        500 mg 100 mL/hr over 60 Minutes Intravenous Every 12 hours 06/30/22 2034 07/08/22 1319   06/30/22 2042  vancomycin variable dose per unstable renal function (pharmacist dosing)  Status:  Discontinued         Does not apply See admin instructions 06/30/22 2042 07/01/22 1122   06/30/22 1700  vancomycin (VANCOCIN) 2,500 mg in sodium chloride 0.9 % 500 mL IVPB        2,500 mg 262.5 mL/hr over 120 Minutes Intravenous  Once 06/30/22 1645 06/30/22 2005   06/30/22 1700  ceFEPIme (MAXIPIME) 2 g in sodium chloride 0.9 % 100 mL IVPB  Status:  Discontinued        2 g 200 mL/hr over 30 Minutes Intravenous Every 8 hours 06/30/22 1646 07/07/22 1957       Objective: Vitals:   07/10/22 2206 07/11/22 0658  BP: 140/84 (!) 145/82  Pulse: 85 78  Resp:    Temp: 98.7 F (37.1 C) 98.6 F (37 C)  SpO2: 99% 99%    Intake/Output Summary (Last 24 hours) at 07/11/2022 1205 Last data filed at 07/11/2022 0023 Gross per 24 hour   Intake --  Output 500 ml  Net -500 ml    Filed Weights   06/30/22 1212 07/06/22 0500 07/07/22 0500  Weight: (!) 158.8 kg (!) 172.5 kg (!) 172.5 kg   Weight change:  Body mass index is 45.1 kg/m.   Physical Exam: General exam: Pleasant middle-aged Caucasian male.  Looks older for his age. Skin: No rashes, lesions or ulcers. HEENT: Atraumatic, normocephalic, no obvious bleeding Lungs: Clear to auscultation bilaterally CVS: Regular rate and rhythm, no murmur GI/Abd soft, nontender, nondistended, bowels are present CNS: Alert, awake, oriented x3 Psychiatry: Mood appropriate Extremities: Prior left BKA, new right BKA on wound VAC  Data Review: I have personally reviewed the laboratory data and studies available.  F/u labs ordered Unresulted Labs (From admission, onward)     Start     Ordered   07/07/22 0500  Creatinine, serum  (enoxaparin (LOVENOX)    CrCl >/= 30 ml/min)  Weekly,   R     Comments: while on enoxaparin therapy    06/30/22 1924            Signed, Lorin Glass, MD [facility] 07/11/2022

## 2022-07-12 DIAGNOSIS — G9341 Metabolic encephalopathy: Secondary | ICD-10-CM | POA: Diagnosis not present

## 2022-07-12 LAB — GLUCOSE, CAPILLARY
Glucose-Capillary: 180 mg/dL — ABNORMAL HIGH (ref 70–99)
Glucose-Capillary: 187 mg/dL — ABNORMAL HIGH (ref 70–99)
Glucose-Capillary: 171 mg/dL — ABNORMAL HIGH (ref 70–99)
Glucose-Capillary: 185 mg/dL — ABNORMAL HIGH (ref 70–99)

## 2022-07-12 LAB — SURGICAL PATHOLOGY

## 2022-07-12 MED ORDER — OXYCODONE HCL 5 MG PO TABS
15.0000 mg | ORAL_TABLET | ORAL | Status: DC | PRN
  Administered 2022-07-12 – 2022-07-13 (×7): 15 mg via ORAL
  Filled 2022-07-12 (×8): qty 3

## 2022-07-12 NOTE — TOC Progression Note (Signed)
Transition of Care [facility]) - Progression Note    Patient Details  Name: Joseph [name] MRN: [id number] Date of Birth: 12-10-1971  Transition of Care [facility]) CM/SW Contact  Baldemar Lenis, [name] Phone Number: 07/12/2022, 2:35 PM  Clinical Narrative:   CSW alerted by [facility] that insurance is pending, requesting a proactive peer to peer. CSW sent MD information to [facility], in case a peer to peer is needed. CSW also sent [facility] updated therapy notes, per [facility] request. Authorization is still pending at this time. CSW to follow.    Expected Discharge Plan: Home/Self Care Barriers to Discharge: Continued Medical Work up  Expected Discharge Plan and Services Expected Discharge Plan: Home/Self Care       Living arrangements for the past 2 months: Single Family Home                                       Social Determinants of Health (SDOH) Interventions    Readmission Risk Interventions    11/24/2021    4:05 PM 08/20/2021   12:26 PM 07/12/2021   12:29 PM  Readmission Risk Prevention Plan  Transportation Screening Complete Complete Complete  Medication Review Oceanographer) Complete Complete Complete  PCP or Specialist appointment within 3-5 days of discharge Complete Complete Complete  HRI or Home Care Consult Complete Complete Complete  SW Recovery Care/Counseling Consult Complete Complete Complete  Palliative Care Screening Not Applicable Not Applicable Not Applicable  Skilled Nursing Facility Complete Not Applicable Not Applicable

## 2022-07-12 NOTE — Progress Notes (Signed)
Patient ID: Joseph [name], male   DOB: 1971-05-16, 51 y.o.   MRN: [id number] Patient is postoperative day 5 right transtibial amputation.  There is no drainage in the wound VAC canister.  Patient states he normally takes 15 mg of oxycodone at home for breakthrough pain.  His as needed oxycodone on order was increased to 15 mg every 4 hours as needed pain.  Undergoing evaluation for inpatient rehab.

## 2022-07-12 NOTE — Progress Notes (Signed)
PROGRESS NOTE  Joseph [name]  DOB: 1971-09-07  PCP: Marva Panda, NP HCW:[id number]  DOA: 06/30/2022  LOS: 11 days  Hospital Day: 13  Brief narrative: Joseph [name] is a 51 y.o. male with PMH significant for morbid obesity, OSA, DM2, HTN, HLD, CAD/CABG/stents, ascending aorta aneurysm, PAD s/p prior left BKA, COPD, DVT/PE, GERD, anxiety/depression, arthritis. 7/12, patient presented to the ED with altered mental status after 3 days of nausea, headache, decreased oral intake, confusion, lethargy. In the ED, he was somnolent and was noted to have a wound in his right foot. VBG showed respiratory acidosis with pH low at 7.16 PCO2 elevated to 70. WBC 10.5, lactic acid elevated 2.2 Urine drug screen positive for opiates and benzodiazepines.   Prior to this admission on 7/6, patient had MRI right heel as an outpatient.  It had shown mild linear subperiosteal marrow edema about the posterior aspect of the calcaneus which in the presence of adjacent skin ulcer is concerning for early osteomyelitis On admission, right foot x ray showed worsening dorsal midfoot soft tissue swelling and worsening large heel ulcer. Admitted to hospital service Required BiPAP initially Treated with IV broad-spectrum antibiotics as well as IV fluid Orthopedic consulted 7/19, patient underwent right BKA.  Subjective: Patient was seen and examined this morning.  Remains drowsy.  Does not seem to be in pain. Pending SNF placement  Assessment and plan: Acute osteomyelitis of right foot -In the setting of diabetes, PAD, prior history of left BKA -Imagings as above -7/19, patient underwent right BKA. -Completed a course of antibiotics.  Currently on none. -Wound VAC on.  Recommendation per orthopedics. -PTA, patient was on oxycodone 15 mg 5 times a day as needed.  It was resumed by orthopedics Dr. [name] today. -Continue Lyrica  Acute metabolic encephalopathy -Initially admitted for acute metabolic  encephalopathy likely from combination of infection, use of BZD, opiates  -Mental status normal now.     Acute hypercapnic respiratory failure -Initially was in respiratory failure with PCO2 elevated to 70 requiring BiPAP.   -Gradually improved.  Currently on room air.     AKI on CKD 3a  -Creatinine was elevated as high as 2.46.  Improved to baseline subsequently -Continue to monitor Recent Labs    07/01/22 0259 07/01/22 1451 07/02/22 0225 07/03/22 0325 07/04/22 0146 07/05/22 0446 07/06/22 0454 07/07/22 0537 07/08/22 0345 07/10/22 0602 07/11/22 0406  BUN 47* 48* 50* 36* 28* 19 17  --  21* 39* 40*  CREATININE 2.57* 2.46* 2.22* 1.44* 1.34* 1.14 1.20 1.24 1.29* 1.29* 1.12    Hyperkalemia -Potassium level improved on repeat labs this morning. Recent Labs  Lab 07/06/22 0454 07/08/22 0345 07/10/22 0602 07/11/22 0406  K 5.1 4.7 5.2* 4.9    Type 2 diabetes mellitus -A1c 7.2 on 07/03/2022 -PTA on Lantus, Victoza, metformin 1000 mg twice daily, -Blood sugar is better after metformin was resumed on 7/21.  Continue Semglee 20 units twice daily, NovoLog Premeal 3 units 3 times daily, metformin 1000 mg twice daily, sliding scale insulin with Accu-Cheks.. Recent Labs  Lab 07/11/22 0908 07/11/22 1236 07/11/22 1622 07/11/22 2139 07/12/22 0600  GLUCAP 180* 195* 177* 160* 180*    Chronic diastolic CHF Essential hypertension -Recent echo with EF 55 to 60%  -PTA on Coreg, Lasix. -Currently heart rate and blood pressure is stable on Coreg 3.125 mg twice daily, Lasix remains on hold.  Patient is euvolemic.  Blood pressure stable.   Elevated troponin  CAD/CABG/stents -No anginal symptoms -Initially had mildly  elevated troponin because of demand ischemia -PTA on Coreg, aspirin, Plavix, statin, Ranexa -Currently on aspirin 81 mg daily.  Plavix was resumed on 7/22.  History of PAD Ascending aortic aneurysm HLD -Statin on hold because of elevated CK.  CK level improved on  recheck. -Continue Vascepa.  Resume statin.  Chronic anemia  Hx of iron deficiency anemia -Hemoglobin chronically between 9 and 10.  Slightly running low postoperatively.  Given IV iron -No active bleeding.  Continue to monitor Recent Labs    04/13/22 2242 04/14/22 0331 07/01/22 0800 07/01/22 0810 07/03/22 0325 07/04/22 0146 07/05/22 0446 07/08/22 0345 07/09/22 0603 07/10/22 0602 07/11/22 0406  HGB  --    < >  --    < > 9.1*   < > 9.7* 8.7* 8.4* 8.5* 8.8*  MCV  --    < >  --    < > 78.6*   < > 79.4* 81.1  --  80.3 79.9*  VITAMINB12 175*  --  209  --  1,654*  --   --   --   --   --   --   FOLATE  --   --   --   --  9.9  --   --   --   --   --   --   FERRITIN  --   --   --   --  43  --   --   --   --   --   --   TIBC  --   --   --   --  252  --   --   --   --   --   --   IRON  --   --   --   --  31*  --   --   --   --   --   --   RETICCTPCT  --   --   --   --  2.2  --   --   --   --   --   --    < > = values in this interval not displayed.    History of DVT PE -Not on chronic anticoagulation.  GERD   Anxiety/depression -Currently patient is on Wellbutrin 150 mg daily, Xanax 1 mg nightly as needed, Lexapro 10 mg daily, Atarax 10 mg 3 times daily as needed, Lyrica 75 mg twice daily.  Counseled to minimize use of mood altering medications.  Follow-up with psychiatry as an outpatient   COPD -Stable continue bronchodilators  Morbid obesity  -Body mass index is 45.1 kg/m. Patient has been advised to make an attempt to improve diet and exercise patterns to aid in weight loss.  OSA -Not on CPAP  Goals of care   Code Status: Full Code    Mobility: Rehab recommended  Skin assessment:      Nutritional status:  Body mass index is 45.1 kg/m.          Diet:  Diet Order             Diet Carb Modified Fluid consistency: Thin; Room service appropriate? Yes  Diet effective now                   DVT prophylaxis: Lovenox subcu    Antimicrobials:  None Fluid: None Consultants: Orthopedics Family Communication: Family member not at bedside  Status is: Inpatient  Continue in-hospital care because: Pending placement Level of care: Med-Surg.  With  telemetry  Dispo: The patient is from: Home              Anticipated d/c is to: Rehab              Patient currently is not medically stable to d/c.   Difficult to place patient No     Infusions:   magnesium sulfate bolus IVPB      Scheduled Meds:  vitamin C  1,000 mg Oral Daily   aspirin EC  81 mg Oral Daily   atorvastatin  80 mg Oral QHS   buPROPion  150 mg Oral Daily   carvedilol  3.125 mg Oral BID   clopidogrel  75 mg Oral Daily   docusate sodium  100 mg Oral Daily   enoxaparin (LOVENOX) injection  80 mg Subcutaneous Q24H   escitalopram  10 mg Oral q AM   icosapent Ethyl  2 g Oral BID   insulin aspart  0-15 Units Subcutaneous TID WC   insulin aspart  0-5 Units Subcutaneous QHS   insulin aspart  3 Units Subcutaneous TID WC   insulin glargine-yfgn  20 Units Subcutaneous BID   metFORMIN  1,000 mg Oral BID WC   nutrition supplement (JUVEN)  1 packet Oral BID BM   pantoprazole  40 mg Oral Daily   pregabalin  75 mg Oral BID   ranolazine  500 mg Oral BID   senna  2 tablet Oral Daily   sodium chloride flush  3 mL Intravenous Q12H   zinc sulfate  220 mg Oral Daily    PRN meds: acetaminophen **OR** acetaminophen, albuterol, ALPRAZolam, alum & mag hydroxide-simeth, bisacodyl, guaiFENesin-dextromethorphan, hydrOXYzine, magnesium citrate, magnesium sulfate bolus IVPB, naloxone, ondansetron **OR** ondansetron (ZOFRAN) IV, mouth rinse, oxyCODONE, phenol, polyethylene glycol   Antimicrobials: Anti-infectives (From admission, onward)    Start     Dose/Rate Route Frequency Ordered Stop   07/08/22 0600  ceFAZolin (ANCEF) IVPB 3g/100 mL premix  Status:  Discontinued        3 g 200 mL/hr over 30 Minutes Intravenous On call to O.R. 07/07/22 1350 07/07/22 1737   07/07/22 2000   ceFEPIme (MAXIPIME) 2 g in sodium chloride 0.9 % 100 mL IVPB        2 g 200 mL/hr over 30 Minutes Intravenous Every 8 hours 07/07/22 1957 07/08/22 1530   07/07/22 1845  ceFAZolin (ANCEF) IVPB 2g/100 mL premix  Status:  Discontinued        2 g 200 mL/hr over 30 Minutes Intravenous Every 8 hours 07/07/22 1755 07/07/22 1955   07/01/22 1800  vancomycin (VANCOREADY) IVPB 1500 mg/300 mL  Status:  Discontinued        1,500 mg 150 mL/hr over 120 Minutes Intravenous Every 24 hours 07/01/22 1122 07/08/22 1319   06/30/22 2200  doxycycline (VIBRA-TABS) tablet 100 mg  Status:  Discontinued        100 mg Oral 2 times daily 06/30/22 1924 06/30/22 1946   06/30/22 2100  metroNIDAZOLE (FLAGYL) IVPB 500 mg  Status:  Discontinued        500 mg 100 mL/hr over 60 Minutes Intravenous Every 12 hours 06/30/22 2034 07/08/22 1319   06/30/22 2042  vancomycin variable dose per unstable renal function (pharmacist dosing)  Status:  Discontinued         Does not apply See admin instructions 06/30/22 2042 07/01/22 1122   06/30/22 1700  vancomycin (VANCOCIN) 2,500 mg in sodium chloride 0.9 % 500 mL IVPB  2,500 mg 262.5 mL/hr over 120 Minutes Intravenous  Once 06/30/22 1645 06/30/22 2005   06/30/22 1700  ceFEPIme (MAXIPIME) 2 g in sodium chloride 0.9 % 100 mL IVPB  Status:  Discontinued        2 g 200 mL/hr over 30 Minutes Intravenous Every 8 hours 06/30/22 1646 07/07/22 1957       Objective: Vitals:   07/12/22 0610 07/12/22 0801  BP: 118/85 (!) 148/101  Pulse: 78 72  Resp: 18 14  Temp: (!) 97.5 F (36.4 C) 98.3 F (36.8 C)  SpO2: 96% 100%   No intake or output data in the 24 hours ending 07/12/22 1134   Filed Weights   06/30/22 1212 07/06/22 0500 07/07/22 0500  Weight: (!) 158.8 kg (!) 172.5 kg (!) 172.5 kg   Weight change:  Body mass index is 45.1 kg/m.   Physical Exam: General exam: Pleasant middle-aged Caucasian male.  Not in physical distress Skin: No rashes, lesions or ulcers. HEENT:  Atraumatic, normocephalic, no obvious bleeding Lungs: Clear to auscultation bilaterally CVS: Regular rate and rhythm, no murmur GI/Abd soft, nontender, nondistended, bowels are present CNS: Alert, awake, oriented x3 Psychiatry: Mood appropriate Extremities: Prior left BKA, new right BKA on wound VAC  Data Review: I have personally reviewed the laboratory data and studies available.  F/u labs ordered Unresulted Labs (From admission, onward)     Start     Ordered   07/07/22 0500  Creatinine, serum  (enoxaparin (LOVENOX)    CrCl >/= 30 ml/min)  Weekly,   R     Comments: while on enoxaparin therapy    06/30/22 1924            Signed, Lorin Glass, MD [facility] 07/12/2022

## 2022-07-12 NOTE — Progress Notes (Signed)
Physical Therapy Treatment Patient Details Name: Joseph [name] MRN: [id number] DOB: 07-12-1971 Today's Date: 07/12/2022   History of Present Illness 51 y.o. male Admitted 7/12 for acute metabolic encephalopathy in the setting of acute respiratory failure with hypercarbia and diabetic right foot wound infection and osteomyelitis. Marland Kitchen PMH includes CAD, MI, DM, L BKA, chronic R heel ulcer, COPD, anxiety, depression. S/p Rt BKA 7/19    PT Comments    Pt progressing towards all goals. Pt with desire to don underwear, shorts, and tshirt. Pt with good sitting balance to initiate LB dressing but required maxA to pull all the way up in sidelying. Worked on lateral scoot to the L into his personal w/c. Pt did well, will attempt to trial a slide board once slide board available. Pt did propel w/c in hallway 40x2, limited by neuropathy in hands and onset of fatigue in forearms. Acute PT to cont to follow. Continue to recommend AIR Upon d/c as pt demo's excellent rehab and ability to achieve safe mod I level of function from w/c level.    Recommendations for follow up therapy are one component of a multi-disciplinary discharge planning process, led by the attending physician.  Recommendations may be updated based on patient status, additional functional criteria and insurance authorization.  Follow Up Recommendations  Acute inpatient rehab (3hours/day)     Assistance Recommended at Discharge Intermittent Supervision/Assistance  Patient can return home with the following Two people to help with walking and/or transfers;A lot of help with bathing/dressing/bathroom;Assistance with cooking/housework;Assist for transportation;Help with stairs or ramp for entrance   Equipment Recommendations  None recommended by PT    Recommendations for Other Services Rehab consult     Precautions / Restrictions Precautions Precautions: Fall Precaution Comments: Reviewed precautions, limb protector use, old left BKA, new  right BKA Required Braces or Orthoses: Other Brace Other Brace: limb protector on R and prosthetic on L Restrictions Weight Bearing Restrictions: Yes RLE Weight Bearing: Non weight bearing     Mobility  Bed Mobility Overal bed mobility: Needs Assistance Bed Mobility: Supine to Sit, Rolling Rolling: Mod assist   Supine to sit: Min guard     General bed mobility comments: pt maxA to completely get on to L or R sidelying to pull up underwear and shorts, pt initiates rolling however due to body habitus difficulty to get completely on side, min G with HOB elevated for transfer to EOB    Transfers Overall transfer level: Needs assistance Equipment used: None Transfers: Bed to chair/wheelchair/BSC            Lateral/Scoot Transfers: Min guard General transfer comment: Able to perform lateral scoot from bed to personal w/c towards left side, cues for technique. Wore LLE prosthesis to use for leverage when pushing to scoot. dicussed using slide board however slide board unavailable    Ambulation/Gait               General Gait Details: Unable   Psychologist, counselling mobility: Yes Wheelchair propulsion: Both upper extremities Wheelchair parts: Needs assistance Distance: 40 (x2) Wheelchair Assistance Details (indicate cue type and reason): limited by bilat hand neuropathy and onset of forearm fatigue.  Modified Rankin (Stroke Patients Only)       Balance Overall balance assessment: Needs assistance Sitting-balance support: Feet unsupported, No upper extremity supported Sitting balance-Leahy Scale: Good Sitting balance - Comments: Able to reach outside BOS to donn prosthesis  with increased time and effort needing rest breaks, "i am sweating already."                                    Cognition Arousal/Alertness: Awake/alert Behavior During Therapy: WFL for tasks assessed/performed Overall  Cognitive Status: No family/caregiver present to determine baseline cognitive functioning                                 General Comments: pt very tangential, appears to have decreased safety awareness however responsive when PT's suggests a safer way, pt pleasant and cooperative        Exercises      General Comments General comments (skin integrity, edema, etc.): pt with noted swelling as expected of R LE, wound vac intact      Pertinent Vitals/Pain Pain Assessment Pain Assessment: 0-10 Pain Score: 8  Pain Location: RLE Pain Descriptors / Indicators: Discomfort, Grimacing, Guarding    Home Living                          Prior Function            PT Goals (current goals can now be found in the care plan section) Acute Rehab PT Goals Patient Stated Goal: Get rehab , go home PT Goal Formulation: With patient Time For Goal Achievement: 07/22/22 Potential to Achieve Goals: Good Progress towards PT goals: Progressing toward goals    Frequency    Min 4X/week      PT Plan Current plan remains appropriate    Co-evaluation              AM-PAC PT "6 Clicks" Mobility   Outcome Measure  Help needed turning from your back to your side while in a flat bed without using bedrails?: A Little Help needed moving from lying on your back to sitting on the side of a flat bed without using bedrails?: A Little Help needed moving to and from a bed to a chair (including a wheelchair)?: A Lot Help needed standing up from a chair using your arms (e.g., wheelchair or bedside chair)?: Total Help needed to walk in hospital room?: Total Help needed climbing 3-5 steps with a railing? : Total 6 Click Score: 11    End of Session Equipment Utilized During Treatment: Gait belt Activity Tolerance: Patient tolerated treatment well Patient left: in chair;with call bell/phone within reach (personal w/c, RN tech notified) Nurse Communication: Mobility status  (tech and RN about tx technique, lateral scoot towards left) PT Visit Diagnosis: Unsteadiness on feet (R26.81);History of falling (Z91.81);Difficulty in walking, not elsewhere classified (R26.2);Pain Pain - Right/Left: Right Pain - part of body: Leg     Time: 8032-1224 PT Time Calculation (min) (ACUTE ONLY): 46 min  Charges:  $Therapeutic Activity: 38-52 mins                     Lewis Shock, PT, DPT Acute Rehabilitation Services Secure chat preferred Office #: [phone]    Iona Hansen 07/12/2022, 2:25 PM

## 2022-07-13 ENCOUNTER — Ambulatory Visit: Payer: Medicare HMO | Admitting: Orthopedic Surgery

## 2022-07-13 DIAGNOSIS — G9341 Metabolic encephalopathy: Secondary | ICD-10-CM | POA: Diagnosis not present

## 2022-07-13 LAB — GLUCOSE, CAPILLARY
Glucose-Capillary: 159 mg/dL — ABNORMAL HIGH (ref 70–99)
Glucose-Capillary: 178 mg/dL — ABNORMAL HIGH (ref 70–99)
Glucose-Capillary: 148 mg/dL — ABNORMAL HIGH (ref 70–99)

## 2022-07-13 MED ORDER — INSULIN ASPART 100 UNIT/ML IJ SOLN: 3.0000 [IU] | Freq: Three times a day (TID) | INTRAMUSCULAR | 11 refills | Status: DC

## 2022-07-13 MED ORDER — DOCUSATE SODIUM 100 MG PO CAPS: 100.0000 mg | ORAL_CAPSULE | Freq: Every day | ORAL | 0 refills | Status: DC

## 2022-07-13 MED ORDER — INSULIN GLARGINE-YFGN 100 UNIT/ML ~~LOC~~ SOLN: 20.0000 [IU] | Freq: Two times a day (BID) | SUBCUTANEOUS | 11 refills | Status: DC

## 2022-07-13 MED ORDER — ACETAMINOPHEN 325 MG PO TABS: 650.0000 mg | ORAL_TABLET | Freq: Four times a day (QID) | ORAL | Status: DC | PRN

## 2022-07-13 MED ORDER — ZINC SULFATE 220 (50 ZN) MG PO CAPS: 220.0000 mg | ORAL_CAPSULE | Freq: Every day | ORAL | Status: DC

## 2022-07-13 MED ORDER — INSULIN ASPART 100 UNIT/ML IJ SOLN
0.0000 [IU] | Freq: Every day | INTRAMUSCULAR | 11 refills | Status: DC
Start: 2022-07-13 — End: 2023-01-13

## 2022-07-13 MED ORDER — POLYETHYLENE GLYCOL 3350 17 G PO PACK: 17.0000 g | PACK | Freq: Every day | ORAL | 0 refills | Status: DC | PRN

## 2022-07-13 MED ORDER — ONDANSETRON HCL 4 MG PO TABS: 4.0000 mg | ORAL_TABLET | Freq: Four times a day (QID) | ORAL | 0 refills | Status: DC | PRN

## 2022-07-13 MED ORDER — JUVEN PO PACK: 1.0000 | PACK | Freq: Two times a day (BID) | ORAL | 0 refills | Status: DC

## 2022-07-13 MED ORDER — PREGABALIN 75 MG PO CAPS: 75.0000 mg | ORAL_CAPSULE | Freq: Two times a day (BID) | ORAL | Status: DC

## 2022-07-13 MED ORDER — OXYCODONE HCL 15 MG PO TABS: 15.0000 mg | ORAL_TABLET | ORAL | 0 refills | Status: DC | PRN

## 2022-07-13 MED ORDER — MAGNESIUM CITRATE PO SOLN: 1.0000 | Freq: Once | ORAL | Status: DC | PRN

## 2022-07-13 MED ORDER — ALPRAZOLAM 1 MG PO TABS: 1.0000 mg | ORAL_TABLET | Freq: Every evening | ORAL | 0 refills | Status: DC | PRN

## 2022-07-13 MED ORDER — SENNA 8.6 MG PO TABS: 2.0000 | ORAL_TABLET | Freq: Every day | ORAL | 0 refills | Status: DC

## 2022-07-13 MED ORDER — HYDROXYZINE HCL 10 MG PO TABS: 10.0000 mg | ORAL_TABLET | Freq: Three times a day (TID) | ORAL | 0 refills | Status: DC | PRN

## 2022-07-13 MED ORDER — GUAIFENESIN-DM 100-10 MG/5ML PO SYRP: 15.0000 mL | ORAL_SOLUTION | ORAL | 0 refills | Status: DC | PRN

## 2022-07-13 MED ORDER — INSULIN ASPART 100 UNIT/ML IJ SOLN: 0.0000 [IU] | Freq: Three times a day (TID) | INTRAMUSCULAR | 11 refills | Status: DC

## 2022-07-13 MED ORDER — BISACODYL 5 MG PO TBEC: 5.0000 mg | DELAYED_RELEASE_TABLET | Freq: Every day | ORAL | 0 refills | Status: DC | PRN

## 2022-07-13 NOTE — Progress Notes (Signed)
Occupational Therapy Treatment Patient Details Name: Joseph [name] MRN: [id number] DOB: 27-Jan-1971 Today's Date: 07/13/2022   History of present illness 51 y.o. male Admitted 7/12 for acute metabolic encephalopathy in the setting of acute respiratory failure with hypercarbia and diabetic right foot wound infection and osteomyelitis. Marland Kitchen PMH includes CAD, MI, DM, L BKA, chronic R heel ulcer, COPD, anxiety, depression. S/p Rt BKA 7/19   OT comments  Pt progressing towards goals, able to complete lateral scoot transfer to chair x2 with min guard A. Pt min guard A for LB dressing task, able to verbalize BUE HEP using green theraband. Educated pt on pillow placement for RLE when in bed, pt verbalized understanding. Pt presenting with impairments listed below, will follow acutely. Continue to recommend AIR at d/c.   Recommendations for follow up therapy are one component of a multi-disciplinary discharge planning process, led by the attending physician.  Recommendations may be updated based on patient status, additional functional criteria and insurance authorization.    Follow Up Recommendations  Acute inpatient rehab (3hours/day)    Assistance Recommended at Discharge Frequent or constant Supervision/Assistance  Patient can return home with the following  Two people to help with walking and/or transfers;A lot of help with bathing/dressing/bathroom;Direct supervision/assist for medications management;Direct supervision/assist for financial management;Assist for transportation;Help with stairs or ramp for entrance   Equipment Recommendations  None recommended by OT;Other (comment)    Recommendations for Other Services PT consult;Rehab consult    Precautions / Restrictions Precautions Precautions: Fall Precaution Comments: Reviewed precautions, limb protector use, old left BKA, new right BKA Required Braces or Orthoses: Other Brace Other Brace: limb protector on R and prosthetic on  L Restrictions Weight Bearing Restrictions: Yes RLE Weight Bearing: Non weight bearing       Mobility Bed Mobility Overal bed mobility: Needs Assistance Bed Mobility: Sit to Supine, Supine to Sit     Supine to sit: Min guard Sit to supine: Min guard        Transfers Overall transfer level: Needs assistance Equipment used: None Transfers: Bed to chair/wheelchair/BSC Sit to Stand: Min guard          Lateral/Scoot Transfers: Min guard General transfer comment: able to perform lateral scoot transfer to/from chair     Balance Overall balance assessment: Needs assistance Sitting-balance support: Feet unsupported, No upper extremity supported Sitting balance-Leahy Scale: Good Sitting balance - Comments: can reach outside BOS without LOB                                   ADL either performed or assessed with clinical judgement   ADL Overall ADL's : Needs assistance/impaired                     Lower Body Dressing: Supervision/safety;Sitting/lateral leans Lower Body Dressing Details (indicate cue type and reason): to don shrinker Toilet Transfer: Requires drop arm;Requires wide/bariatric;Transfer board;Min Pension scheme manager Details (indicate cue type and reason): lateral scoot transfer to chair         Functional mobility during ADLs: Min guard      Extremity/Trunk Assessment Upper Extremity Assessment Upper Extremity Assessment: Generalized weakness   Lower Extremity Assessment Lower Extremity Assessment: Defer to PT evaluation        Vision   Vision Assessment?: No apparent visual deficits Additional Comments: wears glasses   Perception Perception Perception: Not tested   Praxis Praxis Praxis: Not tested  Cognition Arousal/Alertness: Awake/alert Behavior During Therapy: WFL for tasks assessed/performed Overall Cognitive Status: No family/caregiver present to determine baseline cognitive functioning                                           Exercises Other Exercises Other Exercises: pt performed horizontal abduction x 5 bil. UEs green theraband Other Exercises: 5 reps shoulder flexion and 5 reps shoulder extension green theraband Lt and Rt UE Other Exercises: Chest press x 5 with green theraband each UE    Shoulder Instructions       General Comments VSS on RA    Pertinent Vitals/ Pain       Pain Assessment Pain Assessment: Faces Pain Score: 2  Faces Pain Scale: Hurts a little bit Pain Location: RLE Pain Descriptors / Indicators: Discomfort, Grimacing, Guarding Pain Intervention(s): Limited activity within patient's tolerance, Monitored during session, Repositioned  Home Living                                          Prior Functioning/Environment              Frequency  Min 2X/week        Progress Toward Goals  OT Goals(current goals can now be found in the care plan section)  Progress towards OT goals: Progressing toward goals  Acute Rehab OT Goals Patient Stated Goal: to go to rehab OT Goal Formulation: With patient Time For Goal Achievement: 07/19/22 Potential to Achieve Goals: Good ADL Goals Pt Will Perform Upper Body Dressing: with modified independence;sitting Pt Will Perform Lower Body Dressing: with min assist;sitting/lateral leans;sit to/from stand Pt Will Transfer to Toilet: with mod assist;with +2 assist;with transfer board;bedside commode  Plan Discharge plan remains appropriate    Co-evaluation                 AM-PAC OT "6 Clicks" Daily Activity     Outcome Measure   Help from another person eating meals?: None Help from another person taking care of personal grooming?: None Help from another person toileting, which includes using toliet, bedpan, or urinal?: A Lot Help from another person bathing (including washing, rinsing, drying)?: A Lot Help from another person to put on and taking off regular upper body  clothing?: A Little Help from another person to put on and taking off regular lower body clothing?: A Little 6 Click Score: 18    End of Session    OT Visit Diagnosis: Unsteadiness on feet (R26.81);Other abnormalities of gait and mobility (R26.89);Muscle weakness (generalized) (M62.81)   Activity Tolerance Patient tolerated treatment well   Patient Left in bed;with call bell/phone within reach;with bed alarm set   Nurse Communication Mobility status        Time: 1093-2355 OT Time Calculation (min): 27 min  Charges: OT General Charges $OT Visit: 1 Visit OT Treatments $Self Care/Home Management : 23-37 mins  Alfonzo Beers, OTD, OTR/L Acute Rehab [phone]   Rolm Gala Zienna Ahlin 07/13/2022, 2:04 PM

## 2022-07-13 NOTE — Progress Notes (Signed)
PROGRESS NOTE  Joseph [name]  DOB: Jan 20, 1971  PCP: [name], NP [id number]  DOA: 06/30/2022  LOS: 12 days  Hospital Day: 14  Brief narrative: Joseph [name] is a 51 y.o. male with PMH significant for morbid obesity, OSA, DM2, HTN, HLD, CAD/CABG/stents, ascending aorta aneurysm, PAD s/p prior left BKA, COPD, DVT/PE, GERD, anxiety/depression, arthritis. 7/12, patient presented to the ED with altered mental status after 3 days of nausea, headache, decreased oral intake, confusion, lethargy. In the ED, he was somnolent and was noted to have a wound in his right foot. VBG showed respiratory acidosis with pH low at 7.16 PCO2 elevated to 70. WBC 10.5, lactic acid elevated 2.2 Urine drug screen positive for opiates and benzodiazepines.   Prior to this admission on 7/6, patient had MRI right heel as an outpatient.  It had shown mild linear subperiosteal marrow edema about the posterior aspect of the calcaneus which in the presence of adjacent skin ulcer is concerning for early osteomyelitis On admission, right foot x ray showed worsening dorsal midfoot soft tissue swelling and worsening large heel ulcer. Admitted to hospital service Required BiPAP initially Treated with IV broad-spectrum antibiotics as well as IV fluid Orthopedic consulted 7/19, patient underwent right BKA.  Subjective: Patient was seen and examined this morning.  Remains sleepy.  Not in distress.  No new symptoms.  Assessment and plan: Acute osteomyelitis of right foot -In the setting of diabetes, PAD, prior history of left BKA -Imagings as above -7/19, patient underwent right BKA. -Completed a course of antibiotics.  Currently on none. -Wound VAC on.  Recommendation per orthopedics. -PTA, patient was on oxycodone 15 mg 5 times a day as needed.  It was resumed by orthopedics Dr. Sharol Given today. -Continue Lyrica  Acute metabolic encephalopathy -Initially admitted for acute metabolic encephalopathy likely  from combination of infection, use of BZD, opiates  -Mental status normal now.     Acute hypercapnic respiratory failure -Initially was in respiratory failure with PCO2 elevated to 70 requiring BiPAP.   -Gradually improved.  Currently on room air.     AKI on CKD 3a  -Creatinine was elevated as high as 2.46.  Improved to baseline subsequently -Continue to monitor Recent Labs    07/01/22 0259 07/01/22 1451 07/02/22 0225 07/03/22 0325 07/04/22 0146 07/05/22 0446 07/06/22 0454 07/07/22 0537 07/08/22 0345 07/10/22 0602 07/11/22 0406  BUN 47* 48* 50* 36* 28* 19 17  --  21* 39* 40*  CREATININE 2.57* 2.46* 2.22* 1.44* 1.34* 1.14 1.20 1.24 1.29* 1.29* 1.12    Hyperkalemia -Potassium level improved on repeat labs this morning. Recent Labs  Lab 07/08/22 0345 07/10/22 0602 07/11/22 0406  K 4.7 5.2* 4.9    Type 2 diabetes mellitus -A1c 7.2 on 07/03/2022 -PTA on Lantus, Victoza, metformin 1000 mg twice daily, -Blood sugar is better after metformin was resumed on 7/21.  Continue Semglee 20 units twice daily, NovoLog Premeal 3 units 3 times daily, metformin 1000 mg twice daily, sliding scale insulin with Accu-Cheks.. Recent Labs  Lab 07/12/22 1143 07/12/22 1639 07/12/22 2237 07/13/22 0607 07/13/22 1126  GLUCAP 187* 171* 185* 159* 178*    Chronic diastolic CHF Essential hypertension -Recent echo with EF 55 to 60%  -PTA on Coreg, Lasix. -Currently heart rate and blood pressure is stable on Coreg 3.125 mg twice daily, Lasix remains on hold.  Patient is euvolemic.  Blood pressure stable.   Elevated troponin  CAD/CABG/stents -No anginal symptoms -Initially had mildly elevated troponin because of demand ischemia -  PTA on Coreg, aspirin, Plavix, statin, Ranexa -Currently on aspirin 81 mg daily.  Plavix was resumed on 7/22.  History of PAD Ascending aortic aneurysm HLD -Statin on hold because of elevated CK.  CK level improved on recheck. -Continue Vascepa.  Resume  statin.  Chronic anemia  Hx of iron deficiency anemia -Hemoglobin chronically between 9 and 10.  Slightly running low postoperatively.  Given IV iron -No active bleeding.  Continue to monitor Recent Labs    04/13/22 2242 04/14/22 0331 07/01/22 0800 07/01/22 0810 07/03/22 0325 07/04/22 0146 07/05/22 0446 07/08/22 0345 07/09/22 0603 07/10/22 0602 07/11/22 0406  HGB  --    < >  --    < > 9.1*   < > 9.7* 8.7* 8.4* 8.5* 8.8*  MCV  --    < >  --    < > 78.6*   < > 79.4* 81.1  --  80.3 79.9*  VITAMINB12 175*  --  209  --  1,654*  --   --   --   --   --   --   FOLATE  --   --   --   --  9.9  --   --   --   --   --   --   FERRITIN  --   --   --   --  43  --   --   --   --   --   --   TIBC  --   --   --   --  252  --   --   --   --   --   --   IRON  --   --   --   --  31*  --   --   --   --   --   --   RETICCTPCT  --   --   --   --  2.2  --   --   --   --   --   --    < > = values in this interval not displayed.    History of DVT PE -Not on chronic anticoagulation.  GERD   Anxiety/depression -Currently patient is on Wellbutrin 150 mg daily, Xanax 1 mg nightly as needed, Lexapro 10 mg daily, Atarax 10 mg 3 times daily as needed, Lyrica 75 mg twice daily.  Counseled to minimize use of mood altering medications.  Follow-up with psychiatry as an outpatient   COPD -Stable continue bronchodilators  Morbid obesity  -Body mass index is 45.1 kg/m. Patient has been advised to make an attempt to improve diet and exercise patterns to aid in weight loss.  OSA -Not on CPAP  Goals of care   Code Status: Full Code    Mobility: Rehab recommended  Skin assessment:      Nutritional status:  Body mass index is 45.1 kg/m.          Diet:  Diet Order             Diet Carb Modified Fluid consistency: Thin; Room service appropriate? Yes  Diet effective now                   DVT prophylaxis: Lovenox subcu    Antimicrobials: None Fluid: None Consultants:  Orthopedics Family Communication: Family member not at bedside  Status is: Inpatient  Continue in-hospital care because: Pending insurance authorization for SNF Level of care: Med-Surg.  With telemetry  Dispo:  The patient is from: Home              Anticipated d/c is to: Rehab              Patient currently is not medically stable to d/c.   Difficult to place patient No     Infusions:   magnesium sulfate bolus IVPB      Scheduled Meds:  vitamin C  1,000 mg Oral Daily   aspirin EC  81 mg Oral Daily   atorvastatin  80 mg Oral QHS   buPROPion  150 mg Oral Daily   carvedilol  3.125 mg Oral BID   clopidogrel  75 mg Oral Daily   docusate sodium  100 mg Oral Daily   enoxaparin (LOVENOX) injection  80 mg Subcutaneous Q24H   escitalopram  10 mg Oral q AM   icosapent Ethyl  2 g Oral BID   insulin aspart  0-15 Units Subcutaneous TID WC   insulin aspart  0-5 Units Subcutaneous QHS   insulin aspart  3 Units Subcutaneous TID WC   insulin glargine-yfgn  20 Units Subcutaneous BID   metFORMIN  1,000 mg Oral BID WC   nutrition supplement (JUVEN)  1 packet Oral BID BM   pantoprazole  40 mg Oral Daily   pregabalin  75 mg Oral BID   ranolazine  500 mg Oral BID   senna  2 tablet Oral Daily   sodium chloride flush  3 mL Intravenous Q12H   zinc sulfate  220 mg Oral Daily    PRN meds: acetaminophen **OR** acetaminophen, albuterol, ALPRAZolam, alum & mag hydroxide-simeth, bisacodyl, guaiFENesin-dextromethorphan, hydrOXYzine, magnesium citrate, magnesium sulfate bolus IVPB, naloxone, ondansetron **OR** ondansetron (ZOFRAN) IV, mouth rinse, oxyCODONE, phenol, polyethylene glycol   Antimicrobials: Anti-infectives (From admission, onward)    Start     Dose/Rate Route Frequency Ordered Stop   07/08/22 0600  ceFAZolin (ANCEF) IVPB 3g/100 mL premix  Status:  Discontinued        3 g 200 mL/hr over 30 Minutes Intravenous On call to O.R. 07/07/22 1350 07/07/22 1737   07/07/22 2000  ceFEPIme  (MAXIPIME) 2 g in sodium chloride 0.9 % 100 mL IVPB        2 g 200 mL/hr over 30 Minutes Intravenous Every 8 hours 07/07/22 1957 07/08/22 1530   07/07/22 1845  ceFAZolin (ANCEF) IVPB 2g/100 mL premix  Status:  Discontinued        2 g 200 mL/hr over 30 Minutes Intravenous Every 8 hours 07/07/22 1755 07/07/22 1955   07/01/22 1800  vancomycin (VANCOREADY) IVPB 1500 mg/300 mL  Status:  Discontinued        1,500 mg 150 mL/hr over 120 Minutes Intravenous Every 24 hours 07/01/22 1122 07/08/22 1319   06/30/22 2200  doxycycline (VIBRA-TABS) tablet 100 mg  Status:  Discontinued        100 mg Oral 2 times daily 06/30/22 1924 06/30/22 1946   06/30/22 2100  metroNIDAZOLE (FLAGYL) IVPB 500 mg  Status:  Discontinued        500 mg 100 mL/hr over 60 Minutes Intravenous Every 12 hours 06/30/22 2034 07/08/22 1319   06/30/22 2042  vancomycin variable dose per unstable renal function (pharmacist dosing)  Status:  Discontinued         Does not apply See admin instructions 06/30/22 2042 07/01/22 1122   06/30/22 1700  vancomycin (VANCOCIN) 2,500 mg in sodium chloride 0.9 % 500 mL IVPB        2,500 mg  262.5 mL/hr over 120 Minutes Intravenous  Once 06/30/22 1645 06/30/22 2005   06/30/22 1700  ceFEPIme (MAXIPIME) 2 g in sodium chloride 0.9 % 100 mL IVPB  Status:  Discontinued        2 g 200 mL/hr over 30 Minutes Intravenous Every 8 hours 06/30/22 1646 07/07/22 1957       Objective: Vitals:   07/13/22 1142 07/13/22 1143  BP: (!) 136/95 (!) 136/94  Pulse: 78 78  Resp:    Temp: (!) 97.5 F (36.4 C)   SpO2: 99% 100%    Intake/Output Summary (Last 24 hours) at 07/13/2022 1327 Last data filed at 07/13/2022 0739 Gross per 24 hour  Intake 660 ml  Output 2350 ml  Net -1690 ml     Filed Weights   06/30/22 1212 07/06/22 0500 07/07/22 0500  Weight: (!) 158.8 kg (!) 172.5 kg (!) 172.5 kg   Weight change:  Body mass index is 45.1 kg/m.   Physical Exam: General exam: Pleasant middle-aged Caucasian male.   Not in physical distress Skin: No rashes, lesions or ulcers. HEENT: Atraumatic, normocephalic, no obvious bleeding Lungs: Clear to auscultation bilaterally CVS: Regular rate and rhythm, no murmur GI/Abd soft, nontender, nondistended, bowels are present CNS: Alert, awake, oriented x3 Psychiatry: Mood appropriate Extremities: Prior left BKA, new right BKA on wound VAC  Data Review: I have personally reviewed the laboratory data and studies available.  F/u labs ordered Unresulted Labs (From admission, onward)     Start     Ordered   07/07/22 0500  Creatinine, serum  (enoxaparin (LOVENOX)    CrCl >/= 30 ml/min)  Weekly,   R     Comments: while on enoxaparin therapy    06/30/22 1924            Signed, Lorin Glass, MD [facility] 07/13/2022

## 2022-07-13 NOTE — Progress Notes (Addendum)
Attempted to call report to [facility]. Nurse unavailable to take report at this time, will try again as able.   1643: Report called to Alona Bene, nurse at [facility]. Pt will be switched over to Prevena wound vac once [facility]AR arrives and wife is to take pt's wheelchair with her.

## 2022-07-13 NOTE — TOC Transition Note (Signed)
Transition of Care [facility]) - CM/SW Discharge Note   Patient Details  Name: Joseph [name] MRN: [id number] Date of Birth: December 05, 1971  Transition of Care [facility]) CM/SW Contact:  Geralynn Ochs, LCSW Phone Number: 07/13/2022, 2:43 PM   Clinical Narrative:   CSW updated by [facility] that insurance has been approved and bed is available for today. CSW updated MD, sent discharge information to [facility]. CSW met with patient at bedside with spouse on the phone to update about discharge today, both are in agreement. Transport scheduled with [facility] for next available.  Nurse to call report to [phone].    Final next level of care: IP Rehab Facility Barriers to Discharge: Barriers Resolved   Patient Goals and CMS Choice Patient states their goals for this hospitalization and ongoing recovery are:: to get rehab CMS Medicare.gov Compare Post Acute Care list provided to:: Patient Choice offered to / list presented to : Patient  Discharge Placement              Patient chooses bed at:  ([facility]) Patient to be transferred to facility by: [place] Name of family member notified: Self, [name] Patient and family notified of of transfer: 07/13/22  Discharge Plan and Services                                     Social Determinants of Health (SDOH) Interventions     Readmission Risk Interventions    11/24/2021    4:05 PM 08/20/2021   12:26 PM 07/12/2021   12:29 PM  Readmission Risk Prevention Plan  Transportation Screening Complete Complete Complete  Medication Review Press photographer) Complete Complete Complete  PCP or Specialist appointment within 3-5 days of discharge Complete Complete Complete  HRI or Home Care Consult Complete Complete Complete  SW Recovery Care/Counseling Consult Complete Complete Complete  Palliative Care Screening Not Applicable Not Applicable Not Prentiss Complete Not Applicable Not Applicable

## 2022-07-13 NOTE — Discharge Summary (Signed)
Physician Discharge Summary  Joseph [name] OJJ:[id number] DOB: 10-31-1971 DOA: 06/30/2022  PCP: [name], NP  Admit date: 06/30/2022 Discharge date: 07/13/2022  Admitted From: Home Discharge disposition: [facility] inpatient [facility]  Recommendations at discharge:  Minimize the use of pain medications and mood altering medications.   Brief narrative: Joseph [name] is a 51 y.o. male with PMH significant for morbid obesity, OSA, DM2, HTN, HLD, CAD/CABG/stents, ascending aorta aneurysm, PAD s/p prior left BKA, COPD, DVT/PE, GERD, anxiety/depression, arthritis. 7/12, patient presented to the ED with altered mental status after 3 days of nausea, headache, decreased oral intake, confusion, lethargy. In the ED, he was somnolent and was noted to have a wound in his right foot. VBG showed respiratory acidosis with pH low at 7.16 PCO2 elevated to 70. WBC 10.5, lactic acid elevated 2.2 Urine drug screen positive for opiates and benzodiazepines.   Prior to this admission on 7/6, patient had MRI right heel as an outpatient.  It had shown mild linear subperiosteal marrow edema about the posterior aspect of the calcaneus which in the presence of adjacent skin ulcer is concerning for early osteomyelitis On admission, right foot x ray showed worsening dorsal midfoot soft tissue swelling and worsening large heel ulcer. Admitted to hospital service Required BiPAP initially Treated with IV broad-spectrum antibiotics as well as IV fluid Orthopedic consulted 7/19, patient underwent right BKA.  Subjective: Patient was seen and examined this morning.  Remains sleepy.  Not in distress.  No new symptoms.  Assessment and plan: Acute osteomyelitis of right foot -In the setting of diabetes, PAD, prior history of left BKA -Imagings as above -7/19, patient underwent right BKA. -Completed a course of antibiotics. -Wound VAC on.  -PTA, patient was on oxycodone 15 mg 5 times a day as needed.  It was  resumed by orthopedics -Continue Lyrica  Acute metabolic encephalopathy -Initially admitted for acute metabolic encephalopathy likely from combination of infection, use of BZD, opiates  -Mental status normal now.     Acute hypercapnic respiratory failure -Initially was in respiratory failure with PCO2 elevated to 70 requiring BiPAP.   -Gradually improved.  Currently on room air.     AKI on CKD 3a  -Creatinine was elevated as high as 2.46.  Improved to baseline subsequently -Continue to monitor Recent Labs    07/01/22 0259 07/01/22 1451 07/02/22 0225 07/03/22 0325 07/04/22 0146 07/05/22 0446 07/06/22 0454 07/07/22 0537 07/08/22 0345 07/10/22 0602 07/11/22 0406  BUN 47* 48* 50* 36* 28* 19 17  --  21* 39* 40*  CREATININE 2.57* 2.46* 2.22* 1.44* 1.34* 1.14 1.20 1.24 1.29* 1.29* 1.12   Type 2 diabetes mellitus -A1c 7.2 on 07/03/2022 -PTA on Lantus, Victoza, metformin 1000 mg twice daily, -Blood sugar is better after metformin was resumed on 7/21.  Continue Semglee 20 units twice daily, NovoLog Premeal 3 units 3 times daily, metformin 1000 mg twice daily, sliding scale insulin with Accu-Cheks.. Recent Labs  Lab 07/12/22 1143 07/12/22 1639 07/12/22 2237 07/13/22 0607 07/13/22 1126  GLUCAP 187* 171* 185* 159* 178*   Chronic diastolic CHF Essential hypertension -Recent echo with EF 55 to 60%  -PTA on Coreg, Lasix. -Currently heart rate and blood pressure is stable on Coreg 3.125 mg twice daily, Lasix remains on hold.  Patient is euvolemic.  Blood pressure stable.  Elevated troponin  CAD/CABG/stents -No anginal symptoms -Initially had mildly elevated troponin because of demand ischemia -PTA on Coreg, aspirin, Plavix, statin, Ranexa -Continue all  History of PAD Ascending aortic aneurysm  HLD -Statin on hold because of elevated CK.  CK level improved on recheck. -Continue Vascepa and statin  Chronic anemia  Hx of iron deficiency anemia -Hemoglobin chronically  between 9 and 10.  Slightly running low postoperatively.  Given IV iron -No active bleeding.  Continue to monitor Recent Labs    04/13/22 2242 04/14/22 0331 07/01/22 0800 07/01/22 0810 07/03/22 0325 07/04/22 0146 07/05/22 0446 07/08/22 0345 07/09/22 0603 07/10/22 0602 07/11/22 0406  HGB  --    < >  --    < > 9.1*   < > 9.7* 8.7* 8.4* 8.5* 8.8*  MCV  --    < >  --    < > 78.6*   < > 79.4* 81.1  --  80.3 79.9*  VITAMINB12 175*  --  209  --  1,654*  --   --   --   --   --   --   FOLATE  --   --   --   --  9.9  --   --   --   --   --   --   FERRITIN  --   --   --   --  43  --   --   --   --   --   --   TIBC  --   --   --   --  252  --   --   --   --   --   --   IRON  --   --   --   --  31*  --   --   --   --   --   --   RETICCTPCT  --   --   --   --  2.2  --   --   --   --   --   --    < > = values in this interval not displayed.   History of DVT PE -Not on chronic anticoagulation.  GERD   Anxiety/depression -Currently patient is on Wellbutrin 150 mg daily, Xanax 1 mg nightly as needed, Lexapro 10 mg daily, Atarax 10 mg 3 times daily as needed, Lyrica 75 mg twice daily.  Counseled to minimize use of mood altering medications.  Follow-up with psychiatry as an outpatient   COPD -Stable continue bronchodilators  Morbid obesity  -Body mass index is 45.1 kg/m. Patient has been advised to make an attempt to improve diet and exercise patterns to aid in weight loss.  OSA -Not on CPAP  Wounds:  - Incision (Closed) 01/08/22 Leg Left (Active)  Date First Assessed/Time First Assessed: 01/08/22 1636   Location: Leg  Location Orientation: Left    Assessments 01/08/2022  5:06 PM 01/14/2022  8:00 AM  Dressing Type Negative pressure wound therapy Negative pressure wound therapy  Dressing Clean;Dry;Intact Intact;Dry;Clean  Site / Wound Assessment Dressing in place / Unable to assess Dressing in place / Unable to assess  Drainage Amount None None  Drainage Description -- No odor   Treatment -- Negative pressure wound therapy     No associated orders.     Incision (Closed) 07/07/22 Leg Right (Active)  Date First Assessed/Time First Assessed: 07/07/22 1512   Location: Leg  Location Orientation: Right    Assessments 07/07/2022  4:30 PM 07/13/2022  8:00 AM  Dressing Type Negative pressure wound therapy Negative pressure wound therapy  Dressing Clean, Dry, Intact Clean, Dry, Intact  Site / Wound Assessment Dressing in  place / Unable to assess Dressing in place / Unable to assess  Drainage Amount None None     No associated orders.     Negative Pressure Wound Therapy Leg Right (Active)  Placement Date/Time: 07/07/22 1609   Wound Type: Incision (Closed wound)  Location: Leg  Location Orientation: Right    Assessments 07/07/2022  4:30 PM 07/13/2022  8:00 AM  Site / Wound Assessment Dressing in place / Unable to assess Dressing in place / Unable to assess  Cycle Continuous;On Continuous  Target Pressure (mmHg) 125 125  Dressing Status Intact Intact  Drainage Amount None None     No associated orders.    Discharge Exam:   Vitals:   07/13/22 0727 07/13/22 0729 07/13/22 1142 07/13/22 1143  BP: (!) 136/93 121/86 (!) 136/95 (!) 136/94  Pulse: 72 72 78 78  Resp: 14     Temp: 98.7 F (37.1 C)  (!) 97.5 F (36.4 C)   TempSrc: Oral  Oral   SpO2:   99% 100%  Weight:      Height:        Body mass index is 45.1 kg/m.   General exam: Pleasant middle-aged Caucasian male.  Not in physical distress Skin: No rashes, lesions or ulcers. HEENT: Atraumatic, normocephalic, no obvious bleeding Lungs: Clear to auscultation bilaterally CVS: Regular rate and rhythm, no murmur GI/Abd soft, nontender, nondistended, bowels are present CNS: Alert, awake, oriented x3 Psychiatry: Mood appropriate Extremities: Prior left BKA, new right BKA on wound VAC  Follow ups:    Follow-up Information     Newt Minion, MD Follow up in 1 week(s).   Specialty: Orthopedic  Surgery Contact information: [place] [address] [phone]         [name], NP Follow up.   Contact information: [place] [address] [phone]         Skeet Latch, MD .   Specialty: Cardiology Contact information: [address] [place] [address] [phone]                 Discharge Instructions:   Discharge Instructions     Call MD for:  difficulty breathing, headache or visual disturbances   Complete by: As directed    Call MD for:  extreme fatigue   Complete by: As directed    Call MD for:  hives   Complete by: As directed    Call MD for:  persistant dizziness or light-headedness   Complete by: As directed    Call MD for:  persistant nausea and vomiting   Complete by: As directed    Call MD for:  severe uncontrolled pain   Complete by: As directed    Call MD for:  temperature >100.4   Complete by: As directed    Diet - low sodium heart healthy   Complete by: As directed    Diet Carb Modified   Complete by: As directed    Discharge instructions   Complete by: As directed    Recommendations at discharge:   Minimize the use of pain medications and mood altering medications.  Discharge instructions for diabetes mellitus: Check blood sugar 3 times a day and bedtime at home. If blood sugar running above 200 or less than 70 please call your MD to adjust insulin. If you notice signs and symptoms of hypoglycemia (low blood sugar) like jitteriness, confusion, thirst, tremor and sweating, please check blood sugar, drink sugary drink/biscuits/sweets to increase  sugar level and call MD or return to ER.    Discharge instructions for CHF Check weight daily -preferably same time every day. Restrict fluid intake to 1200 ml daily Restrict salt intake to less than 2 g daily. Call MD if you have one of the following symptoms 1) 3 pound weight gain in 24 hours or 5 pounds in 1  week  2) swelling in the hands, feet or stomach  3) progressive shortness of breath 4) if you have to sleep on extra pillows at night in order to breathe     General discharge instructions: Follow with Primary MD [name], NP in 7 days  Please request your PCP  to go over your hospital tests, procedures, radiology results at the follow up. Please get your medicines reviewed and adjusted.  Your PCP may decide to repeat certain labs or tests as needed. Do not drive, operate heavy machinery, perform activities at heights, swimming or participation in water activities or provide baby sitting services if your were admitted for syncope or siezures until you have seen by Primary MD or a Neurologist and advised to do so again. [place] Controlled Substance Reporting System database was reviewed. Do not drive, operate heavy machinery, perform activities at heights, swim, participate in water activities or provide baby-sitting services while on medications for pain, sleep and mood until your outpatient physician has reevaluated you and advised to do so again.  You are strongly recommended to comply with the dose, frequency and duration of prescribed medications. Activity: As tolerated with Full fall precautions use walker/cane & assistance as needed Avoid using any recreational substances like cigarette, tobacco, alcohol, or non-prescribed drug. If you experience worsening of your admission symptoms, develop shortness of breath, life threatening emergency, suicidal or homicidal thoughts you must seek medical attention immediately by calling 911 or calling your MD immediately  if symptoms less severe. You must read complete instructions/literature along with all the possible adverse reactions/side effects for all the medicines you take and that have been prescribed to you. Take any new medicine only after you have completely understood and accepted all the possible adverse reactions/side  effects.  Wear Seat belts while driving. You were cared for by a hospitalist during your hospital stay. If you have any questions about your discharge medications or the care you received while you were in the hospital after you are discharged, you can call the unit and ask to speak with the hospitalist or the covering physician. Once you are discharged, your primary care physician will handle any further medical issues. Please note that NO REFILLS for any discharge medications will be authorized once you are discharged, as it is imperative that you return to your primary care physician (or establish a relationship with a primary care physician if you do not have one).   Discharge wound care:   Complete by: As directed    Increase activity slowly   Complete by: As directed        Discharge Medications:   Allergies as of 07/13/2022       Reactions   Sulfa Antibiotics Anaphylaxis, Other (See Comments)   Headaches        Medication List     STOP taking these medications    doxycycline 100 MG tablet Commonly known as: VIBRA-TABS   fluconazole 100 MG tablet Commonly known as: DIFLUCAN   furosemide 40 MG tablet Commonly known as: LASIX   Gvoke HypoPen 2-Pack 1 MG/0.2ML Soaj Generic drug: Glucagon  insulin aspart 100 UNIT/ML FlexPen Commonly known as: NOVOLOG Replaced by: insulin aspart 100 UNIT/ML injection   Lantus SoloStar 100 UNIT/ML Solostar Pen Generic drug: insulin glargine   liraglutide 18 MG/3ML Sopn Commonly known as: VICTOZA   loratadine 10 MG tablet Commonly known as: CLARITIN   meclizine 25 MG tablet Commonly known as: ANTIVERT   mupirocin ointment 2 % Commonly known as: BACTROBAN   nitroGLYCERIN 0.4 MG SL tablet Commonly known as: NITROSTAT   promethazine 25 MG tablet Commonly known as: PHENERGAN       TAKE these medications    acetaminophen 500 MG tablet Commonly known as: TYLENOL Take 1,000 mg by mouth 3 (three) times daily as needed for  headache. What changed: Another medication with the same name was added. Make sure you understand how and when to take each.   acetaminophen 325 MG tablet Commonly known as: TYLENOL Take 2 tablets (650 mg total) by mouth every 6 (six) hours as needed for mild pain (or Fever >/= 101). What changed: You were already taking a medication with the same name, and this prescription was added. Make sure you understand how and when to take each.   albuterol 108 (90 Base) MCG/ACT inhaler Commonly known as: VENTOLIN HFA Inhale 2 puffs into the lungs every 6 (six) hours.   ALPRAZolam 1 MG tablet Commonly known as: XANAX Take 1 tablet (1 mg total) by mouth at bedtime as needed for anxiety. What changed:  how much to take when to take this reasons to take this   aspirin EC 81 MG tablet Take 81 mg by mouth in the morning. Swallow whole.   atorvastatin 80 MG tablet Commonly known as: LIPITOR Take 1 tablet (80 mg total) by mouth daily at 6 PM.   bisacodyl 5 MG EC tablet Commonly known as: DULCOLAX Take 1 tablet (5 mg total) by mouth daily as needed for moderate constipation.   buPROPion 150 MG 24 hr tablet Commonly known as: WELLBUTRIN XL Take 150 mg by mouth in the morning and at bedtime.   carvedilol 3.125 MG tablet Commonly known as: COREG Take 3.125 mg by mouth 2 (two) times daily.   clopidogrel 75 MG tablet Commonly known as: PLAVIX Take 1 tablet (75 mg total) by mouth daily with breakfast.   docusate sodium 100 MG capsule Commonly known as: COLACE Take 1 capsule (100 mg total) by mouth daily. Start taking on: July 14, 2022   escitalopram 10 MG tablet Commonly known as: LEXAPRO Take 10 mg by mouth in the morning.   fluticasone 50 MCG/ACT nasal spray Commonly known as: FLONASE Place 2 sprays into both nostrils daily.   guaiFENesin-dextromethorphan 100-10 MG/5ML syrup Commonly known as: ROBITUSSIN DM Take 15 mLs by mouth every 4 (four) hours as needed for cough.    hydrOXYzine 10 MG tablet Commonly known as: ATARAX Take 1 tablet (10 mg total) by mouth 3 (three) times daily as needed for anxiety.   insulin aspart 100 UNIT/ML injection Commonly known as: novoLOG Inject 0-15 Units into the skin 3 (three) times daily with meals. Replaces: insulin aspart 100 UNIT/ML FlexPen   insulin aspart 100 UNIT/ML injection Commonly known as: novoLOG Inject 0-5 Units into the skin at bedtime.   insulin aspart 100 UNIT/ML injection Commonly known as: novoLOG Inject 3 Units into the skin 3 (three) times daily with meals.   insulin glargine-yfgn 100 UNIT/ML injection Commonly known as: SEMGLEE Inject 0.2 mLs (20 Units total) into the skin 2 (two) times daily.  magnesium citrate Soln Take 296 mLs (1 Bottle total) by mouth once as needed for severe constipation.   metFORMIN 1000 MG tablet Commonly known as: GLUCOPHAGE Take 1 tablet (1,000 mg total) by mouth 2 (two) times daily with a meal.   naloxone 4 MG/0.1ML Liqd nasal spray kit Commonly known as: NARCAN Place 4 mg into the nose as needed (overdose).   nutrition supplement (JUVEN) Pack Take 1 packet by mouth 2 (two) times daily between meals. Start taking on: July 14, 2022   ondansetron 4 MG tablet Commonly known as: ZOFRAN Take 1 tablet (4 mg total) by mouth every 6 (six) hours as needed for nausea.   oxyCODONE 15 MG immediate release tablet Commonly known as: ROXICODONE Take 1 tablet (15 mg total) by mouth every 4 (four) hours as needed for moderate pain (pain score 4-6). What changed:  when to take this reasons to take this   pantoprazole 40 MG tablet Commonly known as: PROTONIX Take 1 tablet (40 mg total) by mouth daily. What changed:  how much to take when to take this   polyethylene glycol 17 g packet Commonly known as: MIRALAX / GLYCOLAX Take 17 g by mouth daily as needed for mild constipation.   pregabalin 75 MG capsule Commonly known as: LYRICA Take 1 capsule (75 mg total)  by mouth 2 (two) times daily. What changed:  medication strength how much to take when to take this   ranolazine 500 MG 12 hr tablet Commonly known as: RANEXA Take 1 tablet (500 mg total) by mouth 2 (two) times daily.   senna 8.6 MG Tabs tablet Commonly known as: SENOKOT Take 2 tablets (17.2 mg total) by mouth daily. Start taking on: July 14, 2022   Tums Extra Strength 750 750 MG chewable tablet Generic drug: calcium carbonate Chew 1,500 mg by mouth as needed (stomach pain).   Vascepa 1 g capsule Generic drug: icosapent Ethyl Take 2 capsules (2 g total) by mouth 2 (two) times daily.   zinc sulfate 220 (50 Zn) MG capsule Take 1 capsule (220 mg total) by mouth daily. Start taking on: July 14, 2022               Discharge Care Instructions  (From admission, onward)           Start     Ordered   07/13/22 0000  Discharge wound care:        07/13/22 1415             The results of significant diagnostics from this hospitalization (including imaging, microbiology, ancillary and laboratory) are listed below for reference.    Procedures and Diagnostic Studies:   US RENAL  Result Date: 07/01/2022 CLINICAL DATA:  Acute kidney injury. EXAM: RENAL / URINARY TRACT ULTRASOUND COMPLETE COMPARISON:  CT angio chest 04/13/2022 and abdominal ultrasound 09/19/2017 FINDINGS: Right Kidney: Renal measurements: 12.8 x 6.0 x 6.1 centimeters = volume: 244.1 mL. Echogenicity within normal limits. No mass or hydronephrosis visualized. Left Kidney: Renal measurements: 11.2 x 6.1 x 6.5 centimeters = volume: 231.4 mL. Echogenicity within normal limits. No mass or hydronephrosis visualized. Of note, the LOWER pole region it is difficult to visualized secondary to overlying bowel gas. Bladder: Appears normal for degree of bladder distention. Other: None. IMPRESSION: Normal exam. Electronically Signed   By: Nolon Nations M.D.   On: 07/01/2022 09:16   CT HEAD WO CONTRAST (5MM)  Result  Date: 06/30/2022 CLINICAL DATA:  Mental status change EXAM: CT HEAD WITHOUT  CONTRAST TECHNIQUE: Contiguous axial images were obtained from the base of the skull through the vertex without intravenous contrast. RADIATION DOSE REDUCTION: This exam was performed according to the departmental dose-optimization program which includes automated exposure control, adjustment of the mA and/or kV according to patient size and/or use of iterative reconstruction technique. COMPARISON:  MRI 04/14/2022, CT brain 04/13/2022 FINDINGS: Brain: No acute territorial infarction, hemorrhage or intracranial mass. The ventricles are nonenlarged. Vascular: No hyperdense vessels.  Carotid vascular calcification Skull: Normal. Negative for fracture or focal lesion. Sinuses/Orbits: No acute finding. Mucous retention cysts in the maxillary sinuses Other: None IMPRESSION: Negative.  No CT evidence for acute intracranial abnormality Electronically Signed   By: Donavan Foil M.D.   On: 06/30/2022 22:04   DG Foot Complete Right  Result Date: 06/30/2022 CLINICAL DATA:  Wound Shortness of breath Chest pain Right foot pain EXAM: RIGHT FOOT COMPLETE - 3+ VIEW COMPARISON:  04/14/2022 FINDINGS: Interval worsening of moderate dorsal midfoot soft tissue swelling. Large ulcer noted overlying the heel. No osseous erosions are present to definitively indicate osteomyelitis. Postsurgical changes of first toe amputation again seen. IMPRESSION: 1. Interval worsening of dorsal midfoot soft tissue swelling. 2. Large heel ulcer appears worse since prior exam. Electronically Signed   By: Miachel Roux M.D.   On: 06/30/2022 14:11   DG Chest 1 View  Result Date: 06/30/2022 CLINICAL DATA:  Shortness of breath. EXAM: CHEST  1 VIEW COMPARISON:  None Available. FINDINGS: The heart size and mediastinal contours are within normal limits. Median sternotomy. Linear left basilar opacities. No confluent consolidation. No visible pleural effusions or pneumothorax. No  acute osseous abnormality. IMPRESSION: Subsegmental left basilar atelectasis. Otherwise, no evidence of acute cardiopulmonary disease. Electronically Signed   By: Margaretha Sheffield M.D.   On: 06/30/2022 14:08     Labs:   Basic Metabolic Panel: Recent Labs  Lab 07/07/22 0537 07/08/22 0345 07/08/22 0345 07/10/22 0602 07/11/22 0406  NA  --  139  --  137 140  K  --  4.7   < > 5.2* 4.9  CL  --  112*  --  109 112*  CO2  --  22  --  22 23  GLUCOSE  --  227*  --  153* 152*  BUN  --  21*  --  39* 40*  CREATININE 1.24 1.29*  --  1.29* 1.12  CALCIUM  --  8.1*  --  8.5* 8.8*   < > = values in this interval not displayed.   GFR Estimated Creatinine Clearance: 138.2 mL/min (by C-G formula based on SCr of 1.12 mg/dL). Liver Function Tests: No results for input(s): "AST", "ALT", "ALKPHOS", "BILITOT", "PROT", "ALBUMIN" in the last 168 hours. No results for input(s): "LIPASE", "AMYLASE" in the last 168 hours. No results for input(s): "AMMONIA" in the last 168 hours. Coagulation profile No results for input(s): "INR", "PROTIME" in the last 168 hours.  CBC: Recent Labs  Lab 07/08/22 0345 07/09/22 0603 07/10/22 0602 07/11/22 0406  WBC 10.0  --  9.5 10.1  NEUTROABS  --   --  5.6 6.0  HGB 8.7* 8.4* 8.5* 8.8*  HCT 29.2* 27.7* 28.1* 29.0*  MCV 81.1  --  80.3 79.9*  PLT 221  --  242 264   Cardiac Enzymes: Recent Labs  Lab 07/10/22 0602  CKTOTAL 307   BNP: Invalid input(s): "POCBNP" CBG: Recent Labs  Lab 07/12/22 1143 07/12/22 1639 07/12/22 2237 07/13/22 0607 07/13/22 1126  GLUCAP 187* 171* 185* 159* 178*  D-Dimer No results for input(s): "DDIMER" in the last 72 hours. Hgb A1c No results for input(s): "HGBA1C" in the last 72 hours. Lipid Profile No results for input(s): "CHOL", "HDL", "LDLCALC", "TRIG", "CHOLHDL", "LDLDIRECT" in the last 72 hours. Thyroid function studies No results for input(s): "TSH", "T4TOTAL", "T3FREE", "THYROIDAB" in the last 72 hours.  Invalid  input(s): "FREET3" Anemia work up No results for input(s): "VITAMINB12", "FOLATE", "FERRITIN", "TIBC", "IRON", "RETICCTPCT" in the last 72 hours. Microbiology Recent Results (from the past 240 hour(s))  Surgical pcr screen     Status: Abnormal   Collection Time: 07/06/22 10:41 PM   Specimen: Nasal Mucosa; Nasal Swab  Result Value Ref Range Status   MRSA, PCR POSITIVE (A) NEGATIVE Final    Comment: RESULT CALLED TO, READ BACK BY AND VERIFIED WITH: RN JULIET V. 07/07/22@00 :02 BY TW    Staphylococcus aureus POSITIVE (A) NEGATIVE Final    Comment: (NOTE) The Xpert SA Assay (FDA approved for NASAL specimens in patients 82 years of age and older), is one component of a comprehensive surveillance program. It is not intended to diagnose infection nor to guide or monitor treatment. Performed at [facility], [place] [address], [place], [place] [address]     Time coordinating discharge: 35 minutes  Signed: Teri Legacy  [facility] 07/13/2022, 2:15 PM

## 2022-07-15 ENCOUNTER — Telehealth: Payer: Self-pay | Admitting: *Deleted

## 2022-07-15 NOTE — Telephone Encounter (Signed)
Annette with [facility] needs orders for Wound vac. Please call [phone]

## 2022-07-15 NOTE — Telephone Encounter (Signed)
I called number below no answer and no voicemail I called pt. He d/c from hospital 07/13/22 with a preveena vac and advised that this will stay on for 7 days and we will remove in office made an appt for 07/20/22 @10 :30 and if there were any wound care orders at that point could contact [facility] to advise.

## 2022-07-16 NOTE — Telephone Encounter (Signed)
Scheduled with Dr. [name] at 2pm on 07/19/22, pt informed.

## 2022-07-19 ENCOUNTER — Encounter: Payer: Medicare HMO | Admitting: Orthopedic Surgery

## 2022-07-19 ENCOUNTER — Telehealth: Payer: Self-pay | Admitting: *Deleted

## 2022-07-19 NOTE — Telephone Encounter (Signed)
LM on VM per message below okay. Advised to remove wound vac, been on longer than 7 days. Wrap in dry gauze and ACE bandage and use stump shrinker. I asked her to call me back to see if I needed to fax her orders for this or if VO was okay.

## 2022-07-19 NOTE — Telephone Encounter (Signed)
Drinda Butts with [facility] is aware pt did not make it to his appt today, pt still has on wound vac but she states she does not have the supplies that are needed for this wound vac. , wants to know what you would like to do from here.   Call back [phone]- can leave a vm on secure line

## 2022-07-20 NOTE — Telephone Encounter (Signed)
SW [place], she will take the wound vac off today

## 2022-07-21 ENCOUNTER — Encounter: Payer: Self-pay | Admitting: *Deleted

## 2022-07-22 ENCOUNTER — Ambulatory Visit (INDEPENDENT_AMBULATORY_CARE_PROVIDER_SITE_OTHER): Payer: Medicare HMO | Admitting: Neurology

## 2022-07-22 ENCOUNTER — Encounter: Payer: Self-pay | Admitting: Neurology

## 2022-07-22 VITALS — BP 111/74 | HR 93 | Ht 77.0 in | Wt 362.0 lb

## 2022-07-22 DIAGNOSIS — R55 Syncope and collapse: Secondary | ICD-10-CM | POA: Diagnosis not present

## 2022-07-22 DIAGNOSIS — Z79899 Other long term (current) drug therapy: Secondary | ICD-10-CM

## 2022-07-22 DIAGNOSIS — G4730 Sleep apnea, unspecified: Secondary | ICD-10-CM

## 2022-07-22 DIAGNOSIS — Z9289 Personal history of other medical treatment: Secondary | ICD-10-CM | POA: Diagnosis not present

## 2022-07-22 NOTE — Patient Instructions (Signed)
Your passing out spells are likely from a combination of factors including possibility for underlying heart arrhythmias, low blood pressure, blood sugar fluctuations, medication effect and oversedation, sleepiness from untreated sleep apnea.  You had extensive work-up during your recent hospitalizations.  I recommend that you follow-up with your cardiologist and primary care closely and also discuss a referral to a lung specialist so they can reevaluate you for sleep apnea and treat you accordingly.  I do not think you need any additional testing from my end of things at this time.
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
## 2022-07-23 ENCOUNTER — Ambulatory Visit (INDEPENDENT_AMBULATORY_CARE_PROVIDER_SITE_OTHER): Payer: Medicare HMO | Admitting: Family

## 2022-07-23 DIAGNOSIS — S88112A Complete traumatic amputation at level between knee and ankle, left lower leg, initial encounter: Secondary | ICD-10-CM

## 2022-07-23 DIAGNOSIS — Z89511 Acquired absence of right leg below knee: Secondary | ICD-10-CM

## 2022-07-23 DIAGNOSIS — Z89512 Acquired absence of left leg below knee: Secondary | ICD-10-CM

## 2022-07-23 DIAGNOSIS — S88111A Complete traumatic amputation at level between knee and ankle, right lower leg, initial encounter: Secondary | ICD-10-CM

## 2022-07-27 ENCOUNTER — Encounter: Payer: Self-pay | Admitting: Family

## 2022-07-27 NOTE — Progress Notes (Signed)
Post-Op Visit Note   Patient: Joseph [name]           Date of Birth: 1971/03/11           MRN: [id number] Visit Date: 07/23/2022 PCP: [name], NP  Chief Complaint:  Chief Complaint  Patient presents with   Right Leg - Routine Post Op    07/07/2022 right BKA    HPI:  HPI The patient is a 51 year old gentleman seen status post right below-knee amputation.  He is currently residing at skilled nursing  Patient is a new right transtibial  amputee.  Patient's current comorbidities are not expected to impact the ability to function with the prescribed prosthesis. Patient verbally communicates a strong desire to use a prosthesis. Patient currently requires mobility aids to ambulate without a prosthesis.  Expects not to use mobility aids with a new prosthesis.  Patient is a K2 level ambulator that will use a prosthesis to walk around their home and the community over low level environmental barriers.    Ortho Exam On examination of the right residual limb this is well consolidated is healing well there is no area of gaping no drainage no erythema or warmth staples in place  Visit Diagnoses: No diagnosis found.  Plan: Harvested about half the staples to follow-up once more in 4 weeks.  Provided instructions for skilled nursing to harvest the remaining staples in 1 week continue daily dose of cleansing dry dressings and shrinker given an order for his prosthesis set up on the right  Follow-Up Instructions: Return in about 4 weeks (around 08/20/2022).   Imaging: No results found.  Orders:  No orders of the defined types were placed in this encounter.  No orders of the defined types were placed in this encounter.    PMFS History: Patient Active Problem List   Diagnosis Date Noted   Hx of iron deficiency anemia 07/07/2022   Syncope and collapse 06/15/2022   AMS (altered mental status) 04/14/2022   Acute respiratory failure with hypoxia ([place]) 99991111   Acute  metabolic encephalopathy 99991111   Pneumonia 04/14/2022   Ulcer of right foot, limited to breakdown of skin ([place])    Below-knee amputation of left lower extremity ([place])    Osteomyelitis of foot, right, acute ([place]) 01/06/2022   Bipolar disorder with depression ([place])    Sepsis ([place]) 11/18/2021   Chest pain 11/18/2021   Dyspnea 11/18/2021   Wound infection    Ulcer of left foot with necrosis of bone ([place])    Bicuspid aortic valve 09/14/2021   Aneurysm of ascending aorta ([place]) 09/14/2021   Hyperkalemia 08/20/2021   DM (diabetes mellitus) with peripheral vascular complication ([place]) [id number]   Atypical chest pain 07/17/2021   Hyponatremia 07/17/2021   Leg wound, left 07/17/2021   Essential hypertension 07/17/2021   Acute hyperglycemia 07/09/2021   Non-ST elevation (NSTEMI) myocardial infarction ([place]) 07/22/2020   (HFpEF) heart failure with preserved ejection fraction ([place]) 07/22/2020   Hypotension 07/18/2020   Elevated troponin 07/18/2020   Acute renal failure superimposed on stage 3a chronic kidney disease ([place]) 07/18/2020   Elevated brain natriuretic peptide (BNP) level 07/18/2020   Unstable angina ([place]) 11/01/2019   Edema 06/13/2019   Leukocytosis 05/31/2019   Smoker 03/08/2019   Chronic back pain 02/16/2019   Deep venous thrombosis ([place]) 02/16/2019   Obesity, Class III, BMI 40-49.9 (morbid obesity) ([place]) 02/16/2019   Peripheral nerve disease 02/16/2019   COPD (chronic obstructive pulmonary disease) ([place]) 01/22/2019   Anxiety and depression  03/26/2018   Sinusitis 10/12/2017   Abdominal pain    Loss of weight    Diabetic foot infection (HCC) 09/16/2017   Cellulitis of right foot    Recurrent chest pain 07/29/2016   Hyperglycemia    Insulin dependent type 2 diabetes mellitus (HCC) 04/29/2015   History of pulmonary embolus (PE) 04/27/2013   CAD (coronary artery disease) 09/10/2008   Past Medical History:  Diagnosis Date   Aneurysm of ascending aorta (HCC) 09/14/2021    Anxiety    Arthritis    "knees, shoulders, hips, ankles" (07/29/2016)   Asthma    Bicuspid aortic valve 09/14/2021   CAD (coronary artery disease)    a. 2017: s/p BMS to distal Cx; b. LHC 05/30/2019: 80% mid, distal RCA s/p DES, 30% narrowing of d LM, widely patent LAD w/ luminal irregularities, widely patent stent in dCX  w/ 90+% stenosis distal to stent beofre small trifurcating obtuse marginal (potentially area of restenosis)   Chronic bronchitis (HCC)    Chronic ulcer of great toe of right foot (HCC) 09/09/2017   Chronic ulcer of right great toe (HCC) 09/19/2017   COPD (chronic obstructive pulmonary disease) (HCC)    Coronary arteriosclerosis    Depression    Diabetes mellitus (HCC)    DVT (deep venous thrombosis) (HCC)    GERD (gastroesophageal reflux disease)    Hyperlipidemia    Hypertension    Migraine    Morbid obesity (HCC)    Myocardial infarction (HCC) 1996   "light one"   PE (pulmonary embolism) 04/2013   On chronic Xarelto   Peripheral nerve disease    Pneumonia "several times"   Sleep apnea    Type II diabetes mellitus (HCC)     Family History  Problem Relation Age of Onset   Hypertension Brother    Hypertension Father    Diabetes Other    Hyperlipidemia Other     Past Surgical History:  Procedure Laterality Date   ABDOMINAL AORTOGRAM W/LOWER EXTREMITY N/A 10/10/2020   Procedure: ABDOMINAL AORTOGRAM W/LOWER EXTREMITY;  Surgeon: Sherren Kerns, MD;  [facility];  Service: Cardiovascular;  Laterality: N/A;   AMPUTATION Right 09/21/2017   Procedure: RIGHT GREAT TOE AMPUTATION, POSSIBLE VAC;  Surgeon: Tarry Kos, MD;  [facility];  Service: Orthopedics;  Laterality: Right;   AMPUTATION Left 08/13/2020   Procedure: LEFT FOOT 4TH RAY AMPUTATION;  Surgeon: [name], MD;  Location: [facility] OR;  Service: Orthopedics;  Laterality: Left;   AMPUTATION Left 11/20/2021   Procedure: LEFT TRANSMETATARSAL AMPUTATION;  Surgeon: [name], MD;   Location: [facility] OR;  Service: Orthopedics;  Laterality: Left;   AMPUTATION Left 01/08/2022   Procedure: LEFT BELOW KNEE AMPUTATION;  Surgeon: [name], MD;  Location: [facility] OR;  Service: Orthopedics;  Laterality: Left;   AMPUTATION Right 07/07/2022   Procedure: RIGHT BELOW KNEE AMPUTATION;  Surgeon: [name], MD;  Location: [facility] OR;  Service: Orthopedics;  Laterality: Right;   AORTOGRAM Bilateral 03/13/2021   Procedure: ABDOMINAL AORTOGRAM WITH Left LOWER EXTREMITY RUNOFF;  Surgeon: Leonie Douglas, MD;  Location: [facility] OR;  Service: Vascular;  Laterality: Bilateral;   APPLICATION OF WOUND VAC  01/08/2022   Procedure: APPLICATION OF WOUND VAC;  Surgeon: [name], MD;  Location: [facility] OR;  Service: Orthopedics;;   APPLICATION OF WOUND VAC Right 07/07/2022   Procedure: APPLICATION OF WOUND VAC;  Surgeon: [name], MD;  [facility];  Service: Orthopedics;  Laterality:  Right;   CARDIAC CATHETERIZATION  2006   CARDIAC CATHETERIZATION  1996   "@ [facility]; when I had my heart attack"   CARDIAC CATHETERIZATION N/A 07/29/2016   Procedure: Left Heart Cath and Coronary Angiography;  Surgeon: Runell Gess, MD;  Location: [facility] INVASIVE CV LAB;  Service: Cardiovascular;  Laterality: N/A;   CARDIAC CATHETERIZATION N/A 07/29/2016   Procedure: Coronary Stent Intervention;  Surgeon: Runell Gess, MD;  [facility];  Service: Cardiovascular;  Laterality: N/A;   CARPAL TUNNEL RELEASE Bilateral    CORONARY ANGIOPLASTY WITH STENT PLACEMENT  07/29/2016   CORONARY ARTERY BYPASS GRAFT N/A 07/24/2020   Procedure: CORONARY ARTERY BYPASS GRAFTING (CABG), ON PUMP, TIMES FOUR, USING LEFT INTERNAL MAMMARY ARTERY AND ENDOSCOPICALLY HARVESTED RIGHT GREATER SAPHENOUS VEIN;  Surgeon: Corliss Skains, MD;  [facility];  Service: Open Heart Surgery;  Laterality: N/A;  FLOW TAC   CORONARY STENT INTERVENTION N/A 05/30/2019   Procedure: CORONARY STENT INTERVENTION;  Surgeon: Lyn Records, MD;   [facility];  Service: Cardiovascular;  Laterality: N/A;   CORONARY STENT INTERVENTION N/A 11/02/2019   Procedure: CORONARY STENT INTERVENTION;  Surgeon: Iran Ouch, MD;  [facility];  Service: Cardiovascular;  Laterality: N/A;   ESOPHAGOGASTRODUODENOSCOPY N/A 09/22/2017   Procedure: ESOPHAGOGASTRODUODENOSCOPY (EGD);  Surgeon: Meryl Dare, MD;  Location: [facility] ENDOSCOPY;  Service: Endoscopy;  Laterality: N/A;   INTRAVASCULAR PRESSURE WIRE/FFR STUDY N/A 07/22/2020   Procedure: INTRAVASCULAR PRESSURE WIRE/FFR STUDY;  Surgeon: Marykay Lex, MD;  Location: [facility] INVASIVE CV LAB;  Service: Cardiovascular;  Laterality: N/A;   KNEE ARTHROSCOPY Bilateral    "2 on left; 1 on the right"   LEFT HEART CATH AND CORONARY ANGIOGRAPHY N/A 11/02/2019   Procedure: LEFT HEART CATH AND CORONARY ANGIOGRAPHY;  Surgeon: Iran Ouch, MD;  [facility];  Service: Cardiovascular;  Laterality: N/A;   LEFT HEART CATH AND CORONARY ANGIOGRAPHY N/A 07/22/2020   Procedure: LEFT HEART CATH AND CORONARY ANGIOGRAPHY;  Surgeon: Marykay Lex, MD;  Location: [facility] INVASIVE CV LAB;  Service: Cardiovascular;  Laterality: N/A;   LEFT HEART CATH AND CORS/GRAFTS ANGIOGRAPHY N/A 08/17/2021   Procedure: LEFT HEART CATH AND CORS/GRAFTS ANGIOGRAPHY;  Surgeon: Yvonne Kendall, MD;  [facility];  Service: Cardiovascular;  Laterality: N/A;   PERIPHERAL VASCULAR INTERVENTION Left 10/11/2020   popliteal and SFA stent placement    PERIPHERAL VASCULAR INTERVENTION Left 10/10/2020   Procedure: PERIPHERAL VASCULAR INTERVENTION;  Surgeon: Sherren Kerns, MD;  [facility];  Service: Cardiovascular;  Laterality: Left;   RIGHT/LEFT HEART CATH AND CORONARY ANGIOGRAPHY N/A 05/30/2019   Procedure: RIGHT/LEFT HEART CATH AND CORONARY ANGIOGRAPHY;  Surgeon: Lyn Records, MD;  [facility];  Service: Cardiovascular;  Laterality: N/A;   RIGHT/LEFT  HEART CATH AND CORONARY/GRAFT ANGIOGRAPHY N/A 06/17/2022   Procedure: RIGHT/LEFT HEART CATH AND CORONARY/GRAFT ANGIOGRAPHY;  Surgeon: Lennette Bihari, MD;  [facility];  Service: Cardiovascular;  Laterality: N/A;   SHOULDER OPEN ROTATOR CUFF REPAIR Bilateral    TEE WITHOUT CARDIOVERSION N/A 07/24/2020   Procedure: TRANSESOPHAGEAL ECHOCARDIOGRAM (TEE);  Surgeon: Corliss Skains, MD;  Location: [facility] OR;  Service: Open Heart Surgery;  Laterality: N/A;   Social History   Occupational History   Occupation: disabled     Associate Professor: UNEMPLOYED  Tobacco Use   Smoking status: Former    Packs/day: 1.00    Years: 34.00  Total pack years: 34.00    Types: Cigarettes    Quit date: 07/12/2020    Years since quitting: 2.0   Smokeless tobacco: Former    Types: Snuff, Chew  Vaping Use   Vaping Use: Never used  Substance and Sexual Activity   Alcohol use: Yes    Comment: RARE   Drug use: No   Sexual activity: Yes

## 2022-07-30 ENCOUNTER — Encounter: Payer: Medicare HMO | Admitting: Family

## 2022-08-03 ENCOUNTER — Telehealth: Payer: Self-pay | Admitting: Orthopedic Surgery

## 2022-08-03 ENCOUNTER — Encounter: Payer: Medicare HMO | Admitting: Family

## 2022-08-03 NOTE — Telephone Encounter (Signed)
LMTCB

## 2022-08-03 NOTE — Telephone Encounter (Signed)
Patient called in to reschedule appt today because he cannot get in and out of his wheelchair and his wife cannot help him the way he needs he stated a nurse came out the first time he had to get his leg amputated to help him but he does not have help this time please advise if he can get a nurse to come out to assist him or if [facility] offers any services like that. Please advise would like to reschedule appt after learning about services offered.

## 2022-08-04 ENCOUNTER — Ambulatory Visit: Payer: Medicare HMO | Admitting: Physical Therapy

## 2022-08-06 ENCOUNTER — Other Ambulatory Visit: Payer: Self-pay | Admitting: Orthopedic Surgery

## 2022-08-06 DIAGNOSIS — S88111A Complete traumatic amputation at level between knee and ankle, right lower leg, initial encounter: Secondary | ICD-10-CM

## 2022-08-06 DIAGNOSIS — S88112A Complete traumatic amputation at level between knee and ankle, left lower leg, initial encounter: Secondary | ICD-10-CM

## 2022-08-06 NOTE — Telephone Encounter (Signed)
Pt informed we will be sending in referral to [facility] home health for PT and medical social worker.

## 2022-08-13 ENCOUNTER — Encounter: Payer: Self-pay | Admitting: Family

## 2022-08-13 ENCOUNTER — Ambulatory Visit (INDEPENDENT_AMBULATORY_CARE_PROVIDER_SITE_OTHER): Payer: Medicare HMO | Admitting: Family

## 2022-08-13 DIAGNOSIS — S88111A Complete traumatic amputation at level between knee and ankle, right lower leg, initial encounter: Secondary | ICD-10-CM

## 2022-08-13 DIAGNOSIS — Z89511 Acquired absence of right leg below knee: Secondary | ICD-10-CM

## 2022-08-13 NOTE — Progress Notes (Signed)
Post-Op Visit Note   Patient: Joseph [name]           Date of Birth: 01/09/1971           MRN: [id number] Visit Date: 08/13/2022 PCP: Marva Panda, NP  Chief Complaint:  Chief Complaint  Patient presents with   Right Leg - Routine Post Op    07/07/2022 right BKA     HPI:  HPI The patient is a 51 year old gentleman who is seen status post below-knee amputation on the right this is well-healed remaining staples to be harvested today Ortho Exam On examination of the right residual limb this is well consolidated well-healed staples harvested today without incident no gaping or drainage no bleeding  Visit Diagnoses: No diagnosis found.  Plan: Continue shrinker around-the-clock proceed with prosthesis set up in physical therapy gait training.  Follow-up in the office as needed  Follow-Up Instructions: No follow-ups on file.   Imaging: No results found.  Orders:  No orders of the defined types were placed in this encounter.  No orders of the defined types were placed in this encounter.    PMFS History: Patient Active Problem List   Diagnosis Date Noted   Hx of iron deficiency anemia 07/07/2022   Syncope and collapse 06/15/2022   AMS (altered mental status) 04/14/2022   Acute respiratory failure with hypoxia (HCC) 04/14/2022   Acute metabolic encephalopathy 04/14/2022   Pneumonia 04/14/2022   Ulcer of right foot, limited to breakdown of skin (HCC)    Below-knee amputation of left lower extremity (HCC)    Osteomyelitis of foot, right, acute (HCC) 01/06/2022   Bipolar disorder with depression (HCC)    Sepsis (HCC) 11/18/2021   Chest pain 11/18/2021   Dyspnea 11/18/2021   Wound infection    Ulcer of left foot with necrosis of bone (HCC)    Bicuspid aortic valve 09/14/2021   Aneurysm of ascending aorta (HCC) 09/14/2021   Hyperkalemia 08/20/2021   DM (diabetes mellitus) with peripheral vascular complication (HCC) 08/20/2021   Atypical chest pain 07/17/2021    Hyponatremia 07/17/2021   Leg wound, left 07/17/2021   Essential hypertension 07/17/2021   Acute hyperglycemia 07/09/2021   Non-ST elevation (NSTEMI) myocardial infarction (HCC) 07/22/2020   (HFpEF) heart failure with preserved ejection fraction (HCC) 07/22/2020   Hypotension 07/18/2020   Elevated troponin 07/18/2020   Acute renal failure superimposed on stage 3a chronic kidney disease (HCC) 07/18/2020   Elevated brain natriuretic peptide (BNP) level 07/18/2020   Unstable angina (HCC) 11/01/2019   Edema 06/13/2019   Leukocytosis 05/31/2019   Smoker 03/08/2019   Chronic back pain 02/16/2019   Deep venous thrombosis (HCC) 02/16/2019   Obesity, Class III, BMI 40-49.9 (morbid obesity) (HCC) 02/16/2019   Peripheral nerve disease 02/16/2019   COPD (chronic obstructive pulmonary disease) (HCC) 01/22/2019   Anxiety and depression 03/26/2018   Sinusitis 10/12/2017   Abdominal pain    Loss of weight    Diabetic foot infection (HCC) 09/16/2017   Cellulitis of right foot    Recurrent chest pain 07/29/2016   Hyperglycemia    Insulin dependent type 2 diabetes mellitus (HCC) 04/29/2015   History of pulmonary embolus (PE) 04/27/2013   CAD (coronary artery disease) 09/10/2008   Past Medical History:  Diagnosis Date   Aneurysm of ascending aorta (HCC) 09/14/2021   Anxiety    Arthritis    "knees, shoulders, hips, ankles" (07/29/2016)   Asthma    Bicuspid aortic valve 09/14/2021   CAD (coronary artery disease)  a. 2017: s/p BMS to distal Cx; b. LHC 05/30/2019: 80% mid, distal RCA s/p DES, 30% narrowing of d LM, widely patent LAD w/ luminal irregularities, widely patent stent in [place]  w/ 90+% stenosis distal to stent beofre small trifurcating obtuse marginal (potentially area of restenosis)   Chronic bronchitis (HCC)    Chronic ulcer of great toe of right foot ([place]) 09/09/2017   Chronic ulcer of right great toe ([place]) 09/19/2017   COPD (chronic obstructive pulmonary disease) (HCC)    Coronary  arteriosclerosis    Depression    Diabetes mellitus (HCC)    DVT (deep venous thrombosis) (HCC)    GERD (gastroesophageal reflux disease)    Hyperlipidemia    Hypertension    Migraine    Morbid obesity ([place])    Myocardial infarction ([place]) 1996   "light one"   PE (pulmonary embolism) 04/2013   On chronic Xarelto   Peripheral nerve disease    Pneumonia "several times"   Sleep apnea    Type II diabetes mellitus ([place])     Family History  Problem Relation Age of Onset   Hypertension Brother    Hypertension Father    Diabetes Other    Hyperlipidemia Other     Past Surgical History:  Procedure Laterality Date   ABDOMINAL AORTOGRAM W/LOWER EXTREMITY N/A 10/10/2020   Procedure: ABDOMINAL AORTOGRAM W/LOWER EXTREMITY;  Surgeon: Elam Dutch, MD;  Location: [place] CV LAB;  Service: Cardiovascular;  Laterality: N/A;   AMPUTATION Right 09/21/2017   Procedure: RIGHT GREAT TOE AMPUTATION, POSSIBLE VAC;  Surgeon: Leandrew Koyanagi, MD;  Location: [place];  Service: Orthopedics;  Laterality: Right;   AMPUTATION Left 08/13/2020   Procedure: LEFT FOOT 4TH RAY AMPUTATION;  Surgeon: Newt Minion, MD;  Location: [place];  Service: Orthopedics;  Laterality: Left;   AMPUTATION Left 11/20/2021   Procedure: LEFT TRANSMETATARSAL AMPUTATION;  Surgeon: Newt Minion, MD;  Location: [place];  Service: Orthopedics;  Laterality: Left;   AMPUTATION Left 01/08/2022   Procedure: LEFT BELOW KNEE AMPUTATION;  Surgeon: Newt Minion, MD;  Location: [place];  Service: Orthopedics;  Laterality: Left;   AMPUTATION Right 07/07/2022   Procedure: RIGHT BELOW KNEE AMPUTATION;  Surgeon: Newt Minion, MD;  Location: [place];  Service: Orthopedics;  Laterality: Right;   AORTOGRAM Bilateral 03/13/2021   Procedure: ABDOMINAL AORTOGRAM WITH Left LOWER EXTREMITY RUNOFF;  Surgeon: Cherre Robins, MD;  Location: [place];  Service: Vascular;  Laterality: Bilateral;   APPLICATION OF WOUND VAC  01/08/2022   Procedure: APPLICATION OF  WOUND VAC;  Surgeon: Newt Minion, MD;  Location: [place];  Service: Orthopedics;;   APPLICATION OF WOUND VAC Right 07/07/2022   Procedure: APPLICATION OF WOUND VAC;  Surgeon: Newt Minion, MD;  Location: [place];  Service: Orthopedics;  Laterality: Right;   CARDIAC CATHETERIZATION  2006   [place]   "@ [facility]; when I had my heart attack"   CARDIAC CATHETERIZATION N/A 07/29/2016   Procedure: Left Heart Cath and Coronary Angiography;  Surgeon: Lorretta Harp, MD;  Location: [place] CV LAB;  Service: Cardiovascular;  Laterality: N/A;   CARDIAC CATHETERIZATION N/A 07/29/2016   Procedure: Coronary Stent Intervention;  Surgeon: Lorretta Harp, MD;  Location: [place] CV LAB;  Service: Cardiovascular;  Laterality: N/A;   CARPAL TUNNEL RELEASE Bilateral    CORONARY ANGIOPLASTY WITH STENT PLACEMENT  07/29/2016   CORONARY ARTERY BYPASS GRAFT N/A 07/24/2020   Procedure: CORONARY ARTERY  BYPASS GRAFTING (CABG), ON PUMP, TIMES FOUR, USING LEFT INTERNAL MAMMARY ARTERY AND ENDOSCOPICALLY HARVESTED RIGHT GREATER SAPHENOUS VEIN;  Surgeon: Corliss Skains, MD;  [facility];  Service: Open Heart Surgery;  Laterality: N/A;  FLOW TAC   CORONARY STENT INTERVENTION N/A 05/30/2019   Procedure: CORONARY STENT INTERVENTION;  Surgeon: Lyn Records, MD;  [facility];  Service: Cardiovascular;  Laterality: N/A;   CORONARY STENT INTERVENTION N/A 11/02/2019   Procedure: CORONARY STENT INTERVENTION;  Surgeon: Iran Ouch, MD;  [facility];  Service: Cardiovascular;  Laterality: N/A;   ESOPHAGOGASTRODUODENOSCOPY N/A 09/22/2017   Procedure: ESOPHAGOGASTRODUODENOSCOPY (EGD);  Surgeon: Meryl Dare, MD;  Location: [facility] ENDOSCOPY;  Service: Endoscopy;  Laterality: N/A;   INTRAVASCULAR PRESSURE WIRE/FFR STUDY N/A 07/22/2020   Procedure: INTRAVASCULAR PRESSURE WIRE/FFR STUDY;  Surgeon: Marykay Lex, MD;  Location: [facility] INVASIVE CV LAB;  Service:  Cardiovascular;  Laterality: N/A;   KNEE ARTHROSCOPY Bilateral    "2 on left; 1 on the right"   LEFT HEART CATH AND CORONARY ANGIOGRAPHY N/A 11/02/2019   Procedure: LEFT HEART CATH AND CORONARY ANGIOGRAPHY;  Surgeon: Iran Ouch, MD;  [facility];  Service: Cardiovascular;  Laterality: N/A;   LEFT HEART CATH AND CORONARY ANGIOGRAPHY N/A 07/22/2020   Procedure: LEFT HEART CATH AND CORONARY ANGIOGRAPHY;  Surgeon: Marykay Lex, MD;  Location: [facility] INVASIVE CV LAB;  Service: Cardiovascular;  Laterality: N/A;   LEFT HEART CATH AND CORS/GRAFTS ANGIOGRAPHY N/A 08/17/2021   Procedure: LEFT HEART CATH AND CORS/GRAFTS ANGIOGRAPHY;  Surgeon: Yvonne Kendall, MD;  [facility];  Service: Cardiovascular;  Laterality: N/A;   PERIPHERAL VASCULAR INTERVENTION Left 10/11/2020   popliteal and SFA stent placement    PERIPHERAL VASCULAR INTERVENTION Left 10/10/2020   Procedure: PERIPHERAL VASCULAR INTERVENTION;  Surgeon: Sherren Kerns, MD;  [facility];  Service: Cardiovascular;  Laterality: Left;   RIGHT/LEFT HEART CATH AND CORONARY ANGIOGRAPHY N/A 05/30/2019   Procedure: RIGHT/LEFT HEART CATH AND CORONARY ANGIOGRAPHY;  Surgeon: Lyn Records, MD;  [facility];  Service: Cardiovascular;  Laterality: N/A;   RIGHT/LEFT HEART CATH AND CORONARY/GRAFT ANGIOGRAPHY N/A 06/17/2022   Procedure: RIGHT/LEFT HEART CATH AND CORONARY/GRAFT ANGIOGRAPHY;  Surgeon: Lennette Bihari, MD;  [facility];  Service: Cardiovascular;  Laterality: N/A;   SHOULDER OPEN ROTATOR CUFF REPAIR Bilateral    TEE WITHOUT CARDIOVERSION N/A 07/24/2020   Procedure: TRANSESOPHAGEAL ECHOCARDIOGRAM (TEE);  Surgeon: Corliss Skains, MD;  Location: [facility] OR;  Service: Open Heart Surgery;  Laterality: N/A;   Social History   Occupational History   Occupation: disabled     Associate Professor: UNEMPLOYED  Tobacco Use   Smoking status: Former    Packs/day: 1.00    Years:  34.00    Total pack years: 34.00    Types: Cigarettes    Quit date: 07/12/2020    Years since quitting: 2.0   Smokeless tobacco: Former    Types: Snuff, Chew  Vaping Use   Vaping Use: Never used  Substance and Sexual Activity   Alcohol use: Yes    Comment: RARE   Drug use: No   Sexual activity: Yes

## 2022-09-03 ENCOUNTER — Telehealth: Payer: Self-pay | Admitting: Cardiovascular Disease

## 2022-09-03 NOTE — Telephone Encounter (Signed)
Pt c/o BP issue: STAT if pt c/o blurred vision, one-sided weakness or slurred speech  1. What are your last 5 BP readings?  No exact readings available  2. Are you having any other symptoms (ex. Dizziness, headache, blurred vision, passed out)?    3. What is your BP issue?   Patient's wife states the patient's BP has been elevated, ranging around 170/107-114

## 2022-09-03 NOTE — Telephone Encounter (Signed)
Spoke with wife and patients blood pressure was elevated prior to taking morning medications  Has not taken blood pressure since taking medications and is currently sleeping Has been having headaches but wife stated recently diagnosed with migraines  Saw PCP yesterday who started him back on his Amlodipine 5 mg daily and advised to get appointment with cardiologist  Scheduled appointment for 7/19 with Ronn Melena NP. Wife aware of date, time and location Advised wife if blood pressure still elevated about an hour after waking up for his nap to go ahead and take Amlodipine even though it may not have been 24 hours. Wife verbalized understanding   Will forward to Dr Duke Salvia and Ronn Melena NP for review

## 2022-09-06 NOTE — Telephone Encounter (Signed)
Agree with plan as provided. If we could call today just to get an update on BP and remind them of appt tomorrow. If they will please bring blood pressure cuff to office visit tomorrow.  Best,  Loel Dubonnet, NP

## 2022-09-07 ENCOUNTER — Ambulatory Visit (HOSPITAL_BASED_OUTPATIENT_CLINIC_OR_DEPARTMENT_OTHER): Payer: Medicare HMO | Admitting: Family

## 2022-09-07 NOTE — Telephone Encounter (Signed)
Patient cancelled appt 09/07/22 due to lack of transportation. Will ask scheduling team to get him re-scheduled. [place] SW team in case they can be of assistance with transportation.   Loel Dubonnet, NP

## 2022-09-08 ENCOUNTER — Telehealth: Payer: Self-pay | Admitting: Licensed Clinical Social Worker

## 2022-09-08 NOTE — Telephone Encounter (Signed)
H&V Care Navigation CSW Progress Note  Clinical Social Worker contacted patient by phone to f/u on cancelled appt w/ [facility] team per notes from lack of transportation. Please see progress note for full assessment.  Patient is participating in a Managed Medicaid Plan:  No, [employer] Medicare/Medicaid of [place]  SDOH Screenings   Depression (PHQ2-9): Low Risk  (05/06/2021)  Tobacco Use: Medium Risk (08/13/2022)   Westley Hummer, MSW, [place]  [phone]- work cell phone (preferred) [phone]- desk phone

## 2022-09-09 ENCOUNTER — Telehealth: Payer: Self-pay | Admitting: Licensed Clinical Social Worker

## 2022-09-09 NOTE — Telephone Encounter (Signed)
H&V Care Navigation CSW Progress Note  Clinical Social Worker  contacted by pt  to request information about housing for individuals with disabilities. I will send him information to speak with [email or web address] and CCCS counseling at [facility].   Patient is participating in a Managed Medicaid Plan:  No, Hum[employer] Medicare and Medicaid.  SDOH Screenings   Food Insecurity: No Food Insecurity (09/09/2022)  Housing: Medium Risk (09/09/2022)  Transportation Needs: Unmet Transportation Needs (09/09/2022)  Utilities: Not At Risk (09/09/2022)  Depression (PHQ2-9): Low Risk  (05/06/2021)  Financial Resource Strain: Medium Risk (09/09/2022)  Tobacco Use: Medium Risk (08/13/2022)   Westley Hummer, MSW, [place]  [phone]- work cell phone (preferred) [phone]- desk phone

## 2022-09-09 NOTE — Telephone Encounter (Signed)
Called patient to get blood pressure log, he states that he is not in a place where he can do that right now. Asked that he please send [place] his blood pressure log via mychart or phone call.

## 2022-09-09 NOTE — Telephone Encounter (Signed)
Spoke to patient to reschedule his appt with Laurann Montana, NP. He stated that right now his priority is his father who just had his appendix removed and taking him to his appointments. He also stated that his blood pressure is doing a lot better since he started back on Amlodipine. He said he would call back when found the time to come in. Just FYI

## 2022-09-09 NOTE — Progress Notes (Signed)
Heart and Vascular Care Navigation  09/09/2022  Joseph [name] 22-Nov-1971 [id number]  Reason for Referral:  Patient is participating in a Managed Medicaid Plan: No, Humana Medicare and Medicaid only.   Engaged with patient by telephone for initial visit for Heart and Vascular Care Coordination.                                                                                                   Assessment:          LCSW received referral from [facility] team. Pt cancelled appt due to transportation challenges. LCSW was able to reach pt at [phone]. Introduced self, role, reason for call. Pt confirmed home address, PCP, and current insurance coverage. Pt lives with wife, 17yo son and his aunt and uncle who pay the rent/mortgage on home, his wife receives SNAP. Pt receives Tree surgeon, his wife has no current income, they do receive SNAP. He is a double amputee, he has access to transportation but his son had emergency surgery for appendectomy and they had to take him there, there was limited assistance getting in and out of car. Pt states that he wasn't aware that he had access to rides through his Medicare and Medicaid and is interested in having this information sent to him. LCSW will mail him those benefits. Pt also shares that his PCP sent in a referral for electric wheelchair and he is wondering if I can see anything about that status of that referral. Unfortunately, I cannot see any of that referral, I have encouraged him to reach back out to their team to see if they have any updates.   No additional questions/concerns at this time.                               HRT/VAS Care Coordination     Patients Home Cardiology Office --  [facility]   Outpatient Care Team Social Worker   Social Worker Name: Nile Riggs, [facility] [phone]   Living arrangements for the past 2 months Single Family Home   Lives with: Relatives; Spouse; Minor Children   Patient  Current Insurance Coverage Managed Medicare; Medicaid   Patient Has Concern With Paying Medical Bills No   Does Patient Have Prescription Coverage? Yes   Home Assistive Devices/Equipment Wheelchair   DME Agency NA   [facility] Agency [facility]   Current home services Other (comment)  none       Social History:                                                                             SDOH Screenings   Housing: Medium Risk (09/09/2022)  Transportation Needs: Unmet Transportation Needs (09/09/2022)  Utilities: Not At Risk (  09/09/2022)  Depression (PHQ2-9): Low Risk  (05/06/2021)  Financial Resource Strain: Medium Risk (09/09/2022)  Tobacco Use: Medium Risk (08/13/2022)    SDOH Interventions: Financial Resources:  Financial Strain Interventions: Other (Comment) 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 I remain available as needed.

## 2022-09-09 NOTE — Telephone Encounter (Signed)
Can we please call him to just get a new log of blood pressures? TY!  Loel Dubonnet, NP

## 2022-09-14 NOTE — Telephone Encounter (Signed)
Call attempt to remind patient to send BP log, no answer unable to leave message

## 2022-09-16 ENCOUNTER — Encounter (HOSPITAL_BASED_OUTPATIENT_CLINIC_OR_DEPARTMENT_OTHER): Payer: Self-pay

## 2022-09-16 NOTE — Telephone Encounter (Signed)
3rd call attempt, unable to leave message. Will mail letter to patient asking him to send Blood pressure log, removing from triage basket

## 2022-09-23 ENCOUNTER — Telehealth (HOSPITAL_BASED_OUTPATIENT_CLINIC_OR_DEPARTMENT_OTHER): Payer: Self-pay | Admitting: Cardiovascular Disease

## 2022-09-23 NOTE — Telephone Encounter (Signed)
Returned call to patient,   Wife in the background states that some of these are before he started back on his medicines, but he cannot tell me which.At some point in these, Son had to have an emergency appendectomy which is why some are high he states.  He states these are from the beginning of august- through current.  159/103 141/92 168/110 170/112 151/99 162/100 132/91 116/83 119/86 116/83 180/112 183/113 193/111 166/103 158/104 157/100 157/99 159/100 193/118

## 2022-09-23 NOTE — Telephone Encounter (Signed)
Patient returned RN's call regarding blood pressure readings.

## 2022-09-24 ENCOUNTER — Telehealth (HOSPITAL_BASED_OUTPATIENT_CLINIC_OR_DEPARTMENT_OTHER): Payer: Self-pay | Admitting: Cardiovascular Disease

## 2022-09-24 NOTE — Telephone Encounter (Signed)
Pt. Returned call he will be scheduled by the scheduling team to have BP addressed in the office

## 2022-09-24 NOTE — Telephone Encounter (Signed)
Left message for patient to call back       "When seen 06/15/22 was recommended for f/u in 2 weeks.    Given labile BP, recommend in office visit for follow up.    Loel Dubonnet, NP "

## 2022-09-24 NOTE — Telephone Encounter (Signed)
Patient would like to do a provider switch from Dr. Oval Linsey to Dr. Marlou Porch at the [facility] office as he is a double Freight forwarder and lives closer to this location.

## 2022-09-24 NOTE — Telephone Encounter (Signed)
When seen 06/15/22 was recommended for f/u in 2 weeks.   Given labile BP, recommend in office visit for follow up.   Loel Dubonnet, NP

## 2022-10-01 ENCOUNTER — Ambulatory Visit: Payer: Medicare HMO | Admitting: Physician Assistant

## 2022-10-01 ENCOUNTER — Other Ambulatory Visit (HOSPITAL_BASED_OUTPATIENT_CLINIC_OR_DEPARTMENT_OTHER): Payer: Self-pay | Admitting: Cardiovascular Disease

## 2022-10-01 NOTE — Progress Notes (Deleted)
Cardiology Office Note:    Date:  10/01/2022   ID:  Joseph [name], DOB Nov 28, 1971, MRN [id number]  PCP:  [name], NP  [facility] Cardiologist:  Skeet Latch, MD  [place] Electrophysiologist:  None   Chief Complaint: elevated BP  History of Present Illness:    Joseph [name] is a 51 y.o. male with a hx of   Coronary artery disease  S/p PCI to LCx in 07/2016 S/p CABG in 2021 Cath 07/2021: L-LAD, S-D3, S-OM2, S-RPDA patent  Cath 05/2022: 4/4 patent grafts>>Medical therapy. Normal R heart pressures.  Peripheral arterial disease  S/p partial foot amputation S/p stent to L popliteal and L SFA S/p L BKA Jan 2023 S/p R BKA July 2023 (HFpEF) heart failure with preserved ejection fraction  Ascending thoracic aortic aneurysm  Echocardiogram 12/2021: 4.3 cm  Hypertension  Bicuspid Aortic valve with fusion of left and right coronary cusps by echo 12/2021: Aortic valve mean gradient  measures 6.5 mmHg. Aortic valve peak gradient measures 12.2 mmHg. Hyperlipidemia  Diabetes mellitus  Bicuspid aortic valve Hx of pulmonary embolism  Chronic Obstructive Pulmonary Disease  OSA GERD  R foot ulcer   Seen by neurology 07/2022 for passing out spells. Recommended sleep apnea evaluation.   Past Medical History:  Diagnosis Date   Aneurysm of ascending aorta ([place]) 09/14/2021   Anxiety    Arthritis    "knees, shoulders, hips, ankles" (07/29/2016)   Asthma    Bicuspid aortic valve 09/14/2021   CAD (coronary artery disease)    a. 2017: s/p BMS to distal Cx; b. LHC 05/30/2019: 80% mid, distal RCA s/p DES, 30% narrowing of d LM, widely patent LAD w/ luminal irregularities, widely patent stent in [place]  w/ 90+% stenosis distal to stent beofre small trifurcating obtuse marginal (potentially area of restenosis)   Chronic bronchitis (HCC)    Chronic ulcer of great toe of right foot ([place]) 09/09/2017   Chronic ulcer of right great toe ([place]) 09/19/2017   COPD (chronic obstructive  pulmonary disease) (HCC)    Coronary arteriosclerosis    Depression    Diabetes mellitus ([place])    DVT (deep venous thrombosis) (HCC)    GERD (gastroesophageal reflux disease)    Hyperlipidemia    Hypertension    Migraine    Morbid obesity ([place])    Myocardial infarction ([place]) 1996   "light one"   PE (pulmonary embolism) 04/2013   On chronic Xarelto   Peripheral nerve disease    Pneumonia "several times"   Sleep apnea    Type II diabetes mellitus ([place])     Past Surgical History:  Procedure Laterality Date   ABDOMINAL AORTOGRAM W/LOWER EXTREMITY N/A 10/10/2020   Procedure: ABDOMINAL AORTOGRAM W/LOWER EXTREMITY;  Surgeon: Elam Dutch, MD;  Location: [place] [facility];  Service: Cardiovascular;  Laterality: N/A;   AMPUTATION Right 09/21/2017   Procedure: RIGHT GREAT TOE AMPUTATION, POSSIBLE VAC;  Surgeon: Leandrew Koyanagi, MD;  Location: [place];  Service: Orthopedics;  Laterality: Right;   AMPUTATION Left 08/13/2020   Procedure: LEFT FOOT 4TH RAY AMPUTATION;  Surgeon: Newt Minion, MD;  Location: [place];  Service: Orthopedics;  Laterality: Left;   AMPUTATION Left 11/20/2021   Procedure: LEFT TRANSMETATARSAL AMPUTATION;  Surgeon: Newt Minion, MD;  Location: [place];  Service: Orthopedics;  Laterality: Left;   AMPUTATION Left 01/08/2022   Procedure: LEFT BELOW KNEE AMPUTATION;  Surgeon: Newt Minion, MD;  Location: [place];  Service: Orthopedics;  Laterality: Left;  AMPUTATION Right 07/07/2022   Procedure: RIGHT BELOW KNEE AMPUTATION;  Surgeon: Newt Minion, MD;  Location: [place];  Service: Orthopedics;  Laterality: Right;   AORTOGRAM Bilateral 03/13/2021   Procedure: ABDOMINAL AORTOGRAM WITH Left LOWER EXTREMITY RUNOFF;  Surgeon: Cherre Robins, MD;  Location: [place];  Service: Vascular;  Laterality: Bilateral;   APPLICATION OF WOUND VAC  01/08/2022   Procedure: APPLICATION OF WOUND VAC;  Surgeon: Newt Minion, MD;  Location: [place];  Service: Orthopedics;;   APPLICATION OF WOUND  VAC Right 07/07/2022   Procedure: APPLICATION OF WOUND VAC;  Surgeon: Newt Minion, MD;  Location: [place];  Service: Orthopedics;  Laterality: Right;   CARDIAC CATHETERIZATION  2006   [place]   "@ [facility]; when I had my heart attack"   CARDIAC CATHETERIZATION N/A 07/29/2016   Procedure: Left Heart Cath and Coronary Angiography;  Surgeon: Lorretta Harp, MD;  Location: [place] [facility];  Service: Cardiovascular;  Laterality: N/A;   CARDIAC CATHETERIZATION N/A 07/29/2016   Procedure: Coronary Stent Intervention;  Surgeon: Lorretta Harp, MD;  Location: [place] [facility];  Service: Cardiovascular;  Laterality: N/A;   CARPAL TUNNEL RELEASE Bilateral    CORONARY ANGIOPLASTY WITH STENT PLACEMENT  07/29/2016   CORONARY ARTERY BYPASS GRAFT N/A 07/24/2020   Procedure: CORONARY ARTERY BYPASS GRAFTING (CABG), ON PUMP, TIMES FOUR, USING LEFT INTERNAL MAMMARY ARTERY AND ENDOSCOPICALLY HARVESTED RIGHT GREATER SAPHENOUS VEIN;  Surgeon: Lajuana Matte, MD;  Location: [place];  Service: Open Heart Surgery;  Laterality: N/A;  FLOW TAC   CORONARY STENT INTERVENTION N/A 05/30/2019   Procedure: CORONARY STENT INTERVENTION;  Surgeon: Belva Crome, MD;  Location: [place] [facility];  Service: Cardiovascular;  Laterality: N/A;   CORONARY STENT INTERVENTION N/A 11/02/2019   Procedure: CORONARY STENT INTERVENTION;  Surgeon: [place], MD;  Location: [place] [facility];  Service: Cardiovascular;  Laterality: N/A;   ESOPHAGOGASTRODUODENOSCOPY N/A 09/22/2017   Procedure: ESOPHAGOGASTRODUODENOSCOPY (EGD);  Surgeon: Ladene Artist, MD;  Location: [facility];  Service: Endoscopy;  Laterality: N/A;   INTRAVASCULAR PRESSURE WIRE/FFR STUDY N/A 07/22/2020   Procedure: INTRAVASCULAR PRESSURE WIRE/FFR STUDY;  Surgeon: Leonie Man, MD;  Location: [place] [facility];  Service: Cardiovascular;  Laterality: N/A;   KNEE ARTHROSCOPY Bilateral    "2 on left; 1 on the right"   LEFT HEART CATH AND  CORONARY ANGIOGRAPHY N/A 11/02/2019   Procedure: LEFT HEART CATH AND CORONARY ANGIOGRAPHY;  Surgeon: [place] Hampshire, MD;  Location: [place] [facility];  Service: Cardiovascular;  Laterality: N/A;   LEFT HEART CATH AND CORONARY ANGIOGRAPHY N/A 07/22/2020   Procedure: LEFT HEART CATH AND CORONARY ANGIOGRAPHY;  Surgeon: Leonie Man, MD;  Location: [place] [facility];  Service: Cardiovascular;  Laterality: N/A;   LEFT HEART CATH AND CORS/GRAFTS ANGIOGRAPHY N/A 08/17/2021   Procedure: LEFT HEART CATH AND CORS/GRAFTS ANGIOGRAPHY;  Surgeon: Nelva [place], MD;  Location: [place] [facility];  Service: Cardiovascular;  Laterality: N/A;   PERIPHERAL VASCULAR INTERVENTION Left 10/11/2020   popliteal and SFA stent placement    PERIPHERAL VASCULAR INTERVENTION Left 10/10/2020   Procedure: PERIPHERAL VASCULAR INTERVENTION;  Surgeon: Elam Dutch, MD;  Location: [place] [facility];  Service: Cardiovascular;  Laterality: Left;   RIGHT/LEFT HEART CATH AND CORONARY ANGIOGRAPHY N/A 05/30/2019   Procedure: RIGHT/LEFT HEART CATH AND CORONARY ANGIOGRAPHY;  Surgeon: Belva Crome, MD;  Location: [place] [facility];  Service: Cardiovascular;  Laterality:  N/A;   RIGHT/LEFT HEART CATH AND CORONARY/GRAFT ANGIOGRAPHY N/A 06/17/2022   Procedure: RIGHT/LEFT HEART CATH AND CORONARY/GRAFT ANGIOGRAPHY;  Surgeon: [place] Sine, MD;  Location: [place] [facility];  Service: Cardiovascular;  Laterality: N/A;   SHOULDER OPEN ROTATOR CUFF REPAIR Bilateral    TEE WITHOUT CARDIOVERSION N/A 07/24/2020   Procedure: TRANSESOPHAGEAL ECHOCARDIOGRAM (TEE);  Surgeon: Lajuana Matte, MD;  Location: [place];  Service: Open Heart Surgery;  Laterality: N/A;    Current Medications: No outpatient medications have been marked as taking for the 10/01/22 encounter (Appointment) with Leanor Kail, [place].     Allergies:   Sulfa antibiotics   Social History   Socioeconomic History   Marital status: Married    Spouse name: Not  on file   Number of children: Not on file   Years of education: Not on file   Highest education level: Not on file  Occupational History   Occupation: disabled     Employer: UNEMPLOYED  Tobacco Use   Smoking status: Former    Packs/day: 1.00    Years: 34.00    Total pack years: 34.00    Types: Cigarettes    Quit date: 07/12/2020    Years since quitting: 2.2   Smokeless tobacco: Former    Types: Snuff, Chew  Vaping Use   Vaping Use: Never used  Substance and Sexual Activity   Alcohol use: Yes    Comment: RARE   Drug use: No   Sexual activity: Yes  Other Topics Concern   Not on file  Social History Narrative   Not on file   Social Determinants of Health   Financial Resource Strain: Medium Risk (09/09/2022)   Overall Financial Resource Strain (CARDIA)    Difficulty of Paying Living Expenses: Somewhat hard  Food Insecurity: No Food Insecurity (09/09/2022)   Hunger Vital Sign    Worried About Running Out of Food in the Last Year: Never true    Ran Out of Food in the Last Year: Never true  Transportation Needs: Unmet Transportation Needs (09/09/2022)   PRAPARE - Hydrologist (Medical): Yes    Lack of Transportation (Non-Medical): Yes  Physical Activity: Not on file  Stress: Not on file  Social Connections: Not on file     Family History: The patient's family history includes Diabetes in an other family member; Hyperlipidemia in an other family member; Hypertension in his brother and father.  ***  ROS:   Please see the history of present illness.    All other systems reviewed and are negative. ***  EKGs/Labs/Other Studies Reviewed:    The following studies were reviewed today:  RIGHT/LEFT HEART CATH AND CORONARY/GRAFT ANGIOGRAPHY  05/2022   Conclusion      Mid LM to Ost LAD lesion is 80% stenosed with 40% stenosed side branch in Ost Cx to Prox Cx.   Mid Cx-2 lesion is 5% stenosed.   Dist Cx lesion is 70% stenosed.   Mid Cx-1 lesion is  100% stenosed.   Prox RCA to Mid RCA lesion is 30% stenosed.   Dist RCA lesion is 50% stenosed with 50% stenosed side branch in RPDA.   Prox RCA lesion is 80% stenosed.   Prox Graft to Mid Graft lesion is 30% stenosed.   1st Diag lesion is 40% stenosed.   3rd Diag lesion is 60% stenosed.   Mid LAD lesion is 50% stenosed.   SVG and is large.   SVG and is normal in  caliber.   SVG and is large.   The graft exhibits no disease.   The graft exhibits no disease.   Severe native CAD with 80% distal left main stenosis with widely patent LIMA graft supplying the LAD and SVG supplying the diagonal vessel.   The native circumflex is totally occluded with a widely patent vein graft supplying the distal OM vessel.   The native dominant RCA has 80% focal proximal stenosis with mild in-stent restenosis in the previously placed mid stent with 50% narrowing at the ostium of the PDA with widely patent SVG to PDA.   Normal right heart pressures with mean PA pressure at 21 mmHg/   RECOMMENDATION: Medical therapy.  Continue long-term DAPT.  Optimal blood pressure control with target blood pressure less than 130/80.  Aggressive lipid-lowering therapy with target LDL less than 55.  Diagnostic Dominance: Right  Echo 12/2021 1. Bicuspid aortic valve with fusion of left and right coronary cusps.   2. Left ventricular ejection fraction, by estimation, is 55 to 60%. The  left ventricle has normal function. The left ventricle has no regional  wall motion abnormalities. There is moderate left ventricular hypertrophy.  Left ventricular diastolic  parameters are consistent with Grade II diastolic dysfunction  (pseudonormalization).   3. Right ventricular systolic function is normal. The right ventricular  size is normal.   4. The mitral valve is normal in structure. Trivial mitral valve  regurgitation. No evidence of mitral stenosis.   5. The aortic valve is bicuspid. Aortic valve regurgitation is not   visualized. No aortic stenosis is present.   6. Aortic dilatation noted. There is mild dilatation of the aortic root,  measuring 42 mm. There is mild dilatation of the ascending aorta,  measuring 43 mm.   7. The inferior vena cava is dilated in size with <50% respiratory  variability, suggesting right atrial pressure of 15 mmHg.   EKG:  EKG is *** ordered today.  The ekg ordered today demonstrates ***  Recent Labs: 07/01/2022: TSH 2.506 07/02/2022: B Natriuretic Peptide 133.1 07/04/2022: ALT 20; Magnesium 1.7 07/11/2022: BUN 40; Creatinine, Ser 1.12; Hemoglobin 8.8; Platelets 264; Potassium 4.9; Sodium 140  Recent Lipid Panel    Component Value Date/Time   CHOL 189 08/16/2021 0312   TRIG 62 04/14/2022 0331   HDL 31 (L) 08/16/2021 0312   CHOLHDL 6.1 08/16/2021 0312   VLDL 63 (H) 08/16/2021 0312   LDLCALC 95 08/16/2021 0312   LDLDIRECT 120.0 05/28/2015 0951     Risk Assessment/Calculations:   {Does this patient have ATRIAL FIBRILLATION?:[phone]}   Physical Exam:    VS:  There were no vitals taken for this visit.    Wt Readings from Last 3 Encounters:  07/22/22 (!) 362 lb (164.2 kg)  07/07/22 (!) 380 lb 4.7 oz (172.5 kg)  06/17/22 (!) 350 lb (158.8 kg)     GEN: *** Well nourished, well developed in no acute distress HEENT: Normal NECK: No JVD; No carotid bruits LYMPHATICS: No lymphadenopathy CARDIAC: ***RRR, no murmurs, rubs, gallops RESPIRATORY:  Clear to auscultation without rales, wheezing or rhonchi  ABDOMEN: Soft, non-tender, non-distended MUSCULOSKELETAL:  No edema; No deformity  SKIN: Warm and dry NEUROLOGIC:  Alert and oriented x 3 PSYCHIATRIC:  Normal affect   ASSESSMENT AND PLAN:    ***  2. ***  Medication Adjustments/Labs and Tests Ordered: Current medicines are reviewed at length with the patient today.  Concerns regarding medicines are outlined above.  No orders of the defined types were placed  in this encounter.  No orders of the defined  types were placed in this encounter.   There are no Patient Instructions on file for this visit.   Jarrett Soho, [place]  10/01/2022 12:19 PM    [facility]

## 2022-10-01 NOTE — Telephone Encounter (Signed)
Good morning! This patient has an appointment with you today at 3:35 PM. Would you review this for refill at that time please?

## 2022-10-07 NOTE — Progress Notes (Signed)
Office Visit    Patient Name: Joseph [name] Date of Encounter: 10/07/2022  Primary Care Provider:  Marva Panda, NP Primary Cardiologist:  Chilton Si, MD Primary Electrophysiologist: None  Chief Complaint    Joseph [name] is a 52 y.o. male with PMH of hx of CAD s/p dLCx and s/p CABG in 2021(LIMA-->LAD, SVG-->PDA, OM1, D1), PAD with stents to the R extremity with prior R and L toe amputations, chronic ulcer of both feet great toe, COPD with significant prior smoking history, HTN, HLD, ischemic cardiomyopathy, DM2 with diabetes neuropathy, prior hx of morbid Obesity, OSA unclear if on CPAP, hx of DVT and PE on Xarelto who presents today for complaint of elevated blood pressure.  Past Medical History    Past Medical History:  Diagnosis Date   Aneurysm of ascending aorta (HCC) 09/14/2021   Anxiety    Arthritis    "knees, shoulders, hips, ankles" (07/29/2016)   Asthma    Bicuspid aortic valve 09/14/2021   CAD (coronary artery disease)    a. 2017: s/p BMS to distal Cx; b. LHC 05/30/2019: 80% mid, distal RCA s/p DES, 30% narrowing of d LM, widely patent LAD w/ luminal irregularities, widely patent stent in dCX  w/ 90+% stenosis distal to stent beofre small trifurcating obtuse marginal (potentially area of restenosis)   Chronic bronchitis (HCC)    Chronic ulcer of great toe of right foot (HCC) 09/09/2017   Chronic ulcer of right great toe (HCC) 09/19/2017   COPD (chronic obstructive pulmonary disease) (HCC)    Coronary arteriosclerosis    Depression    Diabetes mellitus (HCC)    DVT (deep venous thrombosis) (HCC)    GERD (gastroesophageal reflux disease)    Hyperlipidemia    Hypertension    Migraine    Morbid obesity (HCC)    Myocardial infarction (HCC) 1996   "light one"   PE (pulmonary embolism) 04/2013   On chronic Xarelto   Peripheral nerve disease    Pneumonia "several times"   Sleep apnea    Type II diabetes mellitus (HCC)    Past Surgical History:   Procedure Laterality Date   ABDOMINAL AORTOGRAM W/LOWER EXTREMITY N/A 10/10/2020   Procedure: ABDOMINAL AORTOGRAM W/LOWER EXTREMITY;  Surgeon: Sherren Kerns, MD;  [facility]: [facility];  Service: Cardiovascular;  Laterality: N/A;   AMPUTATION Right 09/21/2017   Procedure: RIGHT GREAT TOE AMPUTATION, POSSIBLE VAC;  Surgeon: Tarry Kos, MD;  Location: [facility] OR;  Service: Orthopedics;  Laterality: Right;   AMPUTATION Left 08/13/2020   Procedure: LEFT FOOT 4TH RAY AMPUTATION;  Surgeon: [name], MD;  Location: [facility] OR;  Service: Orthopedics;  Laterality: Left;   AMPUTATION Left 11/20/2021   Procedure: LEFT TRANSMETATARSAL AMPUTATION;  Surgeon: [name], MD;  Location: [facility] OR;  Service: Orthopedics;  Laterality: Left;   AMPUTATION Left 01/08/2022   Procedure: LEFT BELOW KNEE AMPUTATION;  Surgeon: [name], MD;  Location: [facility] OR;  Service: Orthopedics;  Laterality: Left;   AMPUTATION Right 07/07/2022   Procedure: RIGHT BELOW KNEE AMPUTATION;  Surgeon: [name], MD;  Location: [facility] OR;  Service: Orthopedics;  Laterality: Right;   AORTOGRAM Bilateral 03/13/2021   Procedure: ABDOMINAL AORTOGRAM WITH Left LOWER EXTREMITY RUNOFF;  Surgeon: Leonie Douglas, MD;  Location: [facility] OR;  Service: Vascular;  Laterality: Bilateral;   APPLICATION OF WOUND VAC  01/08/2022   Procedure: APPLICATION OF WOUND VAC;  Surgeon: [name], MD;  Location: [facility] OR;  Service:  Orthopedics;;   APPLICATION OF WOUND VAC Right 07/07/2022   Procedure: APPLICATION OF WOUND VAC;  Surgeon: Newt Minion, MD;  Location: [place];  Service: Orthopedics;  Laterality: Right;   CARDIAC CATHETERIZATION  2006   [place]   "@ [facility]; when I had my heart attack"   CARDIAC CATHETERIZATION N/A 07/29/2016   Procedure: Left Heart Cath and Coronary Angiography;  Surgeon: Lorretta Harp, MD;  Location: [place] CV LAB;  Service: Cardiovascular;  Laterality: N/A;   CARDIAC CATHETERIZATION N/A 07/29/2016    Procedure: Coronary Stent Intervention;  Surgeon: Lorretta Harp, MD;  Location: [place] CV LAB;  Service: Cardiovascular;  Laterality: N/A;   CARPAL TUNNEL RELEASE Bilateral    CORONARY ANGIOPLASTY WITH STENT PLACEMENT  07/29/2016   CORONARY ARTERY BYPASS GRAFT N/A 07/24/2020   Procedure: CORONARY ARTERY BYPASS GRAFTING (CABG), ON PUMP, TIMES FOUR, USING LEFT INTERNAL MAMMARY ARTERY AND ENDOSCOPICALLY HARVESTED RIGHT GREATER SAPHENOUS VEIN;  Surgeon: Lajuana Matte, MD;  Location: [place];  Service: Open Heart Surgery;  Laterality: N/A;  FLOW TAC   CORONARY STENT INTERVENTION N/A 05/30/2019   Procedure: CORONARY STENT INTERVENTION;  Surgeon: Belva Crome, MD;  Location: [place] CV LAB;  Service: Cardiovascular;  Laterality: N/A;   CORONARY STENT INTERVENTION N/A 11/02/2019   Procedure: CORONARY STENT INTERVENTION;  Surgeon: Wellington Hampshire, MD;  Location: [place] CV LAB;  Service: Cardiovascular;  Laterality: N/A;   ESOPHAGOGASTRODUODENOSCOPY N/A 09/22/2017   Procedure: ESOPHAGOGASTRODUODENOSCOPY (EGD);  Surgeon: Ladene Artist, MD;  Location: [facility] ENDOSCOPY;  Service: Endoscopy;  Laterality: N/A;   INTRAVASCULAR PRESSURE WIRE/FFR STUDY N/A 07/22/2020   Procedure: INTRAVASCULAR PRESSURE WIRE/FFR STUDY;  Surgeon: Leonie Man, MD;  Location: [place] CV LAB;  Service: Cardiovascular;  Laterality: N/A;   KNEE ARTHROSCOPY Bilateral    "2 on left; 1 on the right"   LEFT HEART CATH AND CORONARY ANGIOGRAPHY N/A 11/02/2019   Procedure: LEFT HEART CATH AND CORONARY ANGIOGRAPHY;  Surgeon: Wellington Hampshire, MD;  Location: [place] CV LAB;  Service: Cardiovascular;  Laterality: N/A;   LEFT HEART CATH AND CORONARY ANGIOGRAPHY N/A 07/22/2020   Procedure: LEFT HEART CATH AND CORONARY ANGIOGRAPHY;  Surgeon: Leonie Man, MD;  Location: [place] CV LAB;  Service: Cardiovascular;  Laterality: N/A;   LEFT HEART CATH AND CORS/GRAFTS ANGIOGRAPHY N/A 08/17/2021   Procedure: LEFT HEART  CATH AND CORS/GRAFTS ANGIOGRAPHY;  Surgeon: Nelva Bush, MD;  Location: [place] CV LAB;  Service: Cardiovascular;  Laterality: N/A;   PERIPHERAL VASCULAR INTERVENTION Left 10/11/2020   popliteal and SFA stent placement    PERIPHERAL VASCULAR INTERVENTION Left 10/10/2020   Procedure: PERIPHERAL VASCULAR INTERVENTION;  Surgeon: Elam Dutch, MD;  Location: [place] CV LAB;  Service: Cardiovascular;  Laterality: Left;   RIGHT/LEFT HEART CATH AND CORONARY ANGIOGRAPHY N/A 05/30/2019   Procedure: RIGHT/LEFT HEART CATH AND CORONARY ANGIOGRAPHY;  Surgeon: Belva Crome, MD;  Location: [place] CV LAB;  Service: Cardiovascular;  Laterality: N/A;   RIGHT/LEFT HEART CATH AND CORONARY/GRAFT ANGIOGRAPHY N/A 06/17/2022   Procedure: RIGHT/LEFT HEART CATH AND CORONARY/GRAFT ANGIOGRAPHY;  Surgeon: Troy Sine, MD;  Location: [place] CV LAB;  Service: Cardiovascular;  Laterality: N/A;   SHOULDER OPEN ROTATOR CUFF REPAIR Bilateral    TEE WITHOUT CARDIOVERSION N/A 07/24/2020   Procedure: TRANSESOPHAGEAL ECHOCARDIOGRAM (TEE);  Surgeon: Lajuana Matte, MD;  Location: [place];  Service: Open Heart Surgery;  Laterality: N/A;    Allergies  Allergies  Allergen Reactions   Sulfa Antibiotics Anaphylaxis and Other (See Comments)    Headaches     History of Present Illness    Joseph [name]  is a 51 year old male with the above mention past medical history who presents today for complaint of elevated blood pressure He was initially seen by Dr. Harrington Challenger in 2011 for complaint of chest pain.  He underwent Myoview in 2011 that was normal.  Patient had pulmonary embolism in 2014 and is currently on chronic Xarelto.  In 2017 patient had complaint of chest pain and underwent LHC which showed 80% lesion in distal circumflex that was treated with BMS.  2D echo was also completed with EF of 55-60% with LVH.  02/2019 patient presented with complaint of chest pain and lower extremity edema.  Patient had echo  and Myoview ordered.  Myoview was abnormal with EF of 40%.  He underwent diagnostic LHC on 05/2019 that showed patent circumflex stent with 90% distal stenosis, 80% mid RCA treated with DES x1.  Patient underwent CABG on 07/2020 (LIMA-->LAD, SVG-->PDA, OM1, D1).  He had 2 stents placed to popliteal and left SFA with partial amputation of his foot.  He underwent left below-knee amputation in 12/2021.  2D echo was also completed that showed EF of 55-60, no RWMA, moderate LVH, GRII DD, normal RVSF, trivial MR, no aortic stenosis, mild dilation of ascending aorta (43 mm), mild dilation of aortic root (42 mm).   He was last seen by Richardson Dopp, PA on 05/2022 for complaint of chest pain.  Patient also endorsed shortness of breath with nausea and diaphoresis.  He took nitroglycerin with relief of symptoms.  He underwent right and left heart cath that showed no graft disease and normal right heart pressures with mean PA pressure of 21 mmHg.  He was continued on medical therapy.  Mr. Bouton presents today with his wife and son for follow-up.  Since last being seen in the office patient reports that he has noticed chest pain and elevated blood pressures over the past 4 weeks.  He has used nitroglycerin for relief over 2 or 3 occasions.  He was reportedly out of his amlodipine which he states helped his chest pain and blood pressure over the past few months.  His blood pressure today was well controlled at 110/80.  He reports at home his blood pressures spiked to 198/117.  He reports increased stress and feels that this is a contributing factor to his elevated blood pressures.  He denies any palpitations or dizziness and his blood pressures are elevated.  He is tolerating his medications without any adverse reactions or side effects.  Patient denies chest pain, palpitations, dyspnea, PND, orthopnea, nausea, vomiting, dizziness, syncope, edema, weight gain, or early satiety.   Home Medications    Current Outpatient  Medications  Medication Sig Dispense Refill   acetaminophen (TYLENOL) 325 MG tablet Take 2 tablets (650 mg total) by mouth every 6 (six) hours as needed for mild pain (or Fever >/= 101).     acetaminophen (TYLENOL) 500 MG tablet Take 1,000 mg by mouth 3 (three) times daily as needed for headache.     albuterol (VENTOLIN HFA) 108 (90 Base) MCG/ACT inhaler Inhale 2 puffs into the lungs every 6 (six) hours.     ALPRAZolam (XANAX) 1 MG tablet Take 1 tablet (1 mg total) by mouth at bedtime as needed for anxiety.  0   aspirin EC 81 MG tablet Take 81 mg by mouth in the  morning. Swallow whole.     atorvastatin (LIPITOR) 80 MG tablet Take 1 tablet (80 mg total) by mouth daily at 6 PM. 30 tablet 2   bisacodyl (DULCOLAX) 5 MG EC tablet Take 1 tablet (5 mg total) by mouth daily as needed for moderate constipation. 30 tablet 0   buPROPion (WELLBUTRIN XL) 150 MG 24 hr tablet Take 150 mg by mouth in the morning and at bedtime.     calcium carbonate (TUMS EXTRA STRENGTH 750) 750 MG chewable tablet Chew 1,500 mg by mouth as needed (stomach pain).     carvedilol (COREG) 3.125 MG tablet Take 3.125 mg by mouth 2 (two) times daily.     clopidogrel (PLAVIX) 75 MG tablet Take 1 tablet (75 mg total) by mouth daily with breakfast. 30 tablet 3   docusate sodium (COLACE) 100 MG capsule Take 1 capsule (100 mg total) by mouth daily. 10 capsule 0   escitalopram (LEXAPRO) 10 MG tablet Take 10 mg by mouth in the morning.     fluticasone (FLONASE) 50 MCG/ACT nasal spray Place 2 sprays into both nostrils daily.     guaiFENesin-dextromethorphan (ROBITUSSIN DM) 100-10 MG/5ML syrup Take 15 mLs by mouth every 4 (four) hours as needed for cough. 118 mL 0   hydrOXYzine (ATARAX) 10 MG tablet Take 1 tablet (10 mg total) by mouth 3 (three) times daily as needed for anxiety. 30 tablet 0   icosapent Ethyl (VASCEPA) 1 g capsule Take 2 capsules (2 g total) by mouth 2 (two) times daily. 360 capsule 3   insulin aspart (NOVOLOG) 100 UNIT/ML  injection Inject 0-15 Units into the skin 3 (three) times daily with meals. 10 mL 11   insulin aspart (NOVOLOG) 100 UNIT/ML injection Inject 0-5 Units into the skin at bedtime. 10 mL 11   insulin aspart (NOVOLOG) 100 UNIT/ML injection Inject 3 Units into the skin 3 (three) times daily with meals. 10 mL 11   insulin glargine-yfgn (SEMGLEE) 100 UNIT/ML injection Inject 0.2 mLs (20 Units total) into the skin 2 (two) times daily. 10 mL 11   magnesium citrate SOLN Take 296 mLs (1 Bottle total) by mouth once as needed for severe constipation. 195 mL    metFORMIN (GLUCOPHAGE) 1000 MG tablet Take 1 tablet (1,000 mg total) by mouth 2 (two) times daily with a meal.     naloxone (NARCAN) nasal spray 4 mg/0.1 mL Place 4 mg into the nose as needed (overdose).     nutrition supplement, JUVEN, (JUVEN) PACK Take 1 packet by mouth 2 (two) times daily between meals.  0   ondansetron (ZOFRAN) 4 MG tablet Take 1 tablet (4 mg total) by mouth every 6 (six) hours as needed for nausea. 20 tablet 0   oxyCODONE (ROXICODONE) 15 MG immediate release tablet Take 1 tablet (15 mg total) by mouth every 4 (four) hours as needed for moderate pain (pain score 4-6). 30 tablet 0   pantoprazole (PROTONIX) 40 MG tablet Take 1 tablet (40 mg total) by mouth daily. (Patient taking differently: Take 80 mg by mouth daily before breakfast.) 30 tablet 0   polyethylene glycol (MIRALAX / GLYCOLAX) 17 g packet Take 17 g by mouth daily as needed for mild constipation. 14 each 0   pregabalin (LYRICA) 75 MG capsule Take 1 capsule (75 mg total) by mouth 2 (two) times daily.     ranolazine (RANEXA) 500 MG 12 hr tablet TAKE 1 TABLET(500 MG) BY MOUTH TWICE DAILY 180 tablet 3   senna (SENOKOT) 8.6 MG TABS  tablet Take 2 tablets (17.2 mg total) by mouth daily. 120 tablet 0   zinc sulfate 220 (50 Zn) MG capsule Take 1 capsule (220 mg total) by mouth daily.     No current facility-administered medications for this visit.     Review of Systems  Please see  the history of present illness.    (+) Chest pain (+) Blood pressures  All other systems reviewed and are otherwise negative except as noted above.  Physical Exam    Wt Readings from Last 3 Encounters:  07/22/22 (!) 362 lb (164.2 kg)  07/07/22 (!) 380 lb 4.7 oz (172.5 kg)  06/17/22 (!) 350 lb (158.8 kg)   [id number] were no vitals filed for this visit.,There is no height or weight on file to calculate BMI.  Constitutional:      Appearance: Healthy appearance. Not in distress.  Neck:     Vascular: JVD normal.  Pulmonary:     Effort: Pulmonary effort is normal.     Breath sounds: No wheezing. No rales. Diminished in the bases Cardiovascular:     Normal rate. Regular rhythm. Normal S1. Normal S2.      Murmurs: There is no murmur.  Edema:    Peripheral edema absent.  Abdominal:     Palpations: Abdomen is soft non tender. There is no hepatomegaly.  Skin:    General: Skin is warm and dry.  Neurological:     General: No focal deficit present.     Mental Status: Alert and oriented to person, place and time.     Cranial Nerves: Cranial nerves are intact.  EKG/LABS/Other Studies Reviewed    ECG personally reviewed by me today -none completed today   Lab Results  Component Value Date   WBC 10.1 07/11/2022   HGB 8.8 (L) 07/11/2022   HCT 29.0 (L) 07/11/2022   MCV 79.9 (L) 07/11/2022   PLT 264 07/11/2022   Lab Results  Component Value Date   CREATININE 1.12 07/11/2022   BUN 40 (H) 07/11/2022   NA 140 07/11/2022   K 4.9 07/11/2022   CL 112 (H) 07/11/2022   CO2 23 07/11/2022   Lab Results  Component Value Date   ALT 20 07/04/2022   AST 28 07/04/2022   ALKPHOS 73 07/04/2022   BILITOT 0.2 (L) 07/04/2022   Lab Results  Component Value Date   CHOL 189 08/16/2021   HDL 31 (L) 08/16/2021   LDLCALC 95 08/16/2021   LDLDIRECT 120.0 05/28/2015   TRIG 62 04/14/2022   CHOLHDL 6.1 08/16/2021    Lab Results  Component Value Date   HGBA1C 7.2 (H) 07/03/2022    Assessment  & Plan    1.  Coronary artery disease: -s/p CABG in 2021 with most recent right and left heart catheterization completed with clear grafts and normal RV pressure. -Today patient reports episodes of chest pain with elevated blood pressure spikes that have required nitroglycerin for relief. Continue GDMT with ASA 81 mg, Lipitor 80 mg daily, Plavix 75 mg daily, Ranexa 500 mg twice daily, carvedilol 3.125 mg twice daily -Patient reports relief with amlodipine and has recently been off of medication over the past 4 weeks. -We will increase patient's amlodipine to 10 mg today.  2.  Essential hypertension: -Patient's blood pressure today was well controlled at 110/80 -Continue current antihypertensive regimen as noted above  3.  HFpEF: -Patient's last 2D echo was completed with EF of 55-60, no RWMA, moderate LVH, GRII DD, normal RVSF, trivial MR, -Patient appears  euvolemic on exam today with no shortness of breath noted. -Patient had right and left heart cath that showed normal RV pressures -Continue current GDMT with carvedilol 3.125 mg,  4.  Hyperlipidemia: -Patient's last LDL was 95 -Continue atorvastatin 80 mg and Vascepa 2 g twice daily  5.  Dilated ascending aorta: -2D echo completed 12/2021 with 43 mm noted, patient will need repeat echo or CT for further evaluation  Disposition: Follow-up with Skeet Latch, MD or APP in 3 months  Medication Adjustments/Labs and Tests Ordered: Current medicines are reviewed at length with the patient today.  Concerns regarding medicines are outlined above.   Signed, Mable Fill, Marissa Nestle, NP 10/07/2022, 7:10 PM [facility]  Note:  This document was prepared using Dragon voice recognition software and may include unintentional dictation errors.

## 2022-10-08 ENCOUNTER — Encounter: Payer: Self-pay | Admitting: Nurse Practitioner

## 2022-10-08 ENCOUNTER — Ambulatory Visit: Payer: Medicare HMO | Attending: Family | Admitting: Nurse Practitioner

## 2022-10-08 VITALS — BP 110/80 | HR 82 | Ht 77.0 in | Wt 360.0 lb

## 2022-10-08 DIAGNOSIS — I10 Essential (primary) hypertension: Secondary | ICD-10-CM | POA: Diagnosis not present

## 2022-10-08 DIAGNOSIS — I5032 Chronic diastolic (congestive) heart failure: Secondary | ICD-10-CM

## 2022-10-08 DIAGNOSIS — E785 Hyperlipidemia, unspecified: Secondary | ICD-10-CM

## 2022-10-08 DIAGNOSIS — I7121 Aneurysm of the ascending aorta, without rupture: Secondary | ICD-10-CM

## 2022-10-08 DIAGNOSIS — I25119 Atherosclerotic heart disease of native coronary artery with unspecified angina pectoris: Secondary | ICD-10-CM

## 2022-10-08 MED ORDER — NITROGLYCERIN 0.4 MG SL SUBL: 0.4000 mg | SUBLINGUAL_TABLET | SUBLINGUAL | 3 refills | Status: DC | PRN

## 2022-10-08 MED ORDER — AMLODIPINE BESYLATE 10 MG PO TABS: 10.0000 mg | ORAL_TABLET | Freq: Every day | ORAL | 3 refills | Status: DC

## 2022-10-08 NOTE — Patient Instructions (Signed)
Medication Instructions:  INCREASE AMLODIPINE TO 10 MG EVERYDAY  *If you need a refill on your cardiac medications before your next appointment, please call your pharmacy*   Lab Work: NONE If you have labs (blood work) drawn today and your tests are completely normal, you will receive your results only by: [place] (if you have MyChart) OR A paper copy in the mail If you have any lab test that is abnormal or we need to change your treatment, we will call you to review the results.   Testing/Procedures: NONE   Follow-Up: At [facility], you and your health needs are our priority.  As part of our continuing mission to provide you with exceptional heart care, we have created designated Provider Care Teams.  These Care Teams include your primary Cardiologist (physician) and Advanced Practice Providers (APPs -  Physician Assistants and Nurse Practitioners) who all work together to provide you with the care you need, when you need it.  We recommend signing up for the patient portal called "MyChart".  Sign up information is provided on this After Visit Summary.  MyChart is used to connect with patients for Virtual Visits (Telemedicine).  Patients are able to view lab/test results, encounter notes, upcoming appointments, etc.  Non-urgent messages can be sent to your provider as well.   To learn more about what you can do with MyChart, go to [email or web address].    Your next appointment:   3 month(s)  The format for your next appointment:   In Person  Provider:    Ambrose Pancoast NP  Other Instructions CHECK B/P FOR 1-2 WEEKS AND CALL READINGS IN TO OFFICE   Important Information About Sugar

## 2022-10-27 ENCOUNTER — Ambulatory Visit: Payer: Medicare HMO | Admitting: Family

## 2022-11-02 ENCOUNTER — Ambulatory Visit (INDEPENDENT_AMBULATORY_CARE_PROVIDER_SITE_OTHER): Payer: Medicare HMO | Admitting: Family

## 2022-11-02 ENCOUNTER — Ambulatory Visit (INDEPENDENT_AMBULATORY_CARE_PROVIDER_SITE_OTHER): Payer: Medicare HMO

## 2022-11-02 ENCOUNTER — Encounter: Payer: Self-pay | Admitting: Family

## 2022-11-02 DIAGNOSIS — M25511 Pain in right shoulder: Secondary | ICD-10-CM

## 2022-11-02 MED ORDER — HYDROCODONE-ACETAMINOPHEN 5-325 MG PO TABS: 1.0000 | ORAL_TABLET | Freq: Four times a day (QID) | ORAL | 0 refills | Status: AC | PRN
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
## 2022-11-29 ENCOUNTER — Ambulatory Visit
Admission: RE | Admit: 2022-11-29 | Discharge: 2022-11-29 | Disposition: A | Discharge status: Home / Self Care | Payer: Medicare HMO | Source: Ambulatory Visit | Attending: Family | Admitting: Family

## 2022-11-29 DIAGNOSIS — M25511 Pain in right shoulder: Secondary | ICD-10-CM

## 2022-11-30 ENCOUNTER — Ambulatory Visit (INDEPENDENT_AMBULATORY_CARE_PROVIDER_SITE_OTHER): Payer: Medicare HMO | Admitting: Family

## 2022-11-30 ENCOUNTER — Encounter: Payer: Self-pay | Admitting: Family

## 2022-11-30 DIAGNOSIS — M75121 Complete rotator cuff tear or rupture of right shoulder, not specified as traumatic: Secondary | ICD-10-CM | POA: Diagnosis not present

## 2022-11-30 NOTE — Progress Notes (Signed)
Office Visit Note   Patient: Joseph [name]           Date of Birth: 12-13-1971           MRN: [id number] Visit Date: 11/30/2022              Requested by: [name], NP [place],  [place] [address] PCP: [name], NP  Chief Complaint  Patient presents with   Right Shoulder - Follow-up    MRI review       HPI: The patient is a 51 year old gentleman seen today status post mri scan of right shoulder for chronic pain, weakness and loss of rom  States unable to work with his hand weights  Did have RC tear about 14 years ago  No relief with antiinflammatories or steroid injection in past.  Assessment & Plan: Visit Diagnoses: No diagnosis found.  Plan: discussed results with the patient. He has elected to proceed with arthroscopy right shoulder. Would benefit from PT post operatively.   Follow-Up Instructions: Return post op.   Right Shoulder Exam   Tenderness  Right shoulder tenderness location: diffuse.  Tests  Impingement: positive Drop arm: negative  Other  Pulse: present  Comments:  Loss of rom, resists exam due to pain      Patient is alert, oriented, no adenopathy, well-dressed, normal affect, normal respiratory effort.   Imaging: MR SHOULDER RIGHT WO CONTRAST  Result Date: 11/30/2022 CLINICAL DATA:  Chronic right shoulder pain. EXAM: MRI OF THE RIGHT SHOULDER WITHOUT CONTRAST TECHNIQUE: Multiplanar, multisequence MR imaging of the shoulder was performed. No intravenous contrast was administered. COMPARISON:  None Available. FINDINGS: Rotator cuff: Severe tendinosis of the supraspinatus tendon with a large full-thickness tear and 17 mm of retraction. Severe tendinosis of the infraspinatus tendon with a small interstitial tear at the musculotendinous junction. Teres minor tendon is intact. Severe tendinosis of the subscapularis tendon. Muscles: No muscle atrophy or edema. No intramuscular fluid collection or hematoma.  Biceps Long Head: Severe tendinosis of the intra-articular portion of the long head of the biceps tendon. Acromioclavicular Joint: Mild arthropathy of the acromioclavicular joint. Large amount of subacromial/subdeltoid bursal fluid. Glenohumeral Joint: No joint effusion. Partial-thickness cartilage loss of the glenohumeral joint. Labrum: Superior labral degeneration with a possible small posterosuperior labral tear. Bones: No fracture or dislocation. No aggressive osseous lesion. Intraosseous cyst in the posterior glenoid. Other: No fluid collection or hematoma. IMPRESSION: 1. Severe tendinosis of the supraspinatus tendon with a large full-thickness tear and 17 mm of retraction. 2. Severe tendinosis of the infraspinatus tendon with a small interstitial tear at the musculotendinous junction. 3. Severe tendinosis of the subscapularis tendon. 4. Severe tendinosis of the intra-articular portion of the long head of the biceps tendon. Electronically Signed   By: Kathreen Devoid M.D.   On: 11/30/2022 06:22   No images are attached to the encounter.  Labs: Lab Results  Component Value Date   HGBA1C 7.2 (H) 07/03/2022   HGBA1C 7.1 (H) 04/15/2022   HGBA1C 8.4 (H) 01/07/2022   ESRSEDRATE 62 (H) 07/01/2022   ESRSEDRATE 45 (H) 11/18/2021   ESRSEDRATE 41 (H) 09/16/2017   CRP 12.0 (H) 07/01/2022   CRP 12.1 (H) 11/18/2021   CRP 8.9 (H) 09/16/2017   REPTSTATUS 07/02/2022 FINAL 07/01/2022   CULT (A) 07/01/2022    <10,000 COLONIES/mL INSIGNIFICANT GROWTH Performed at [facility] [address], [place],  [address]    [place] (A) 01/06/2022  Lab Results  Component Value Date   ALBUMIN 2.2 (L) 07/04/2022   ALBUMIN 2.1 (L) 07/03/2022   ALBUMIN 2.0 (L) 07/02/2022   PREALBUMIN 16.5 (L) 01/07/2022    Lab Results  Component Value Date   MG 1.7 07/04/2022   MG 1.6 (L) 07/03/2022   MG 1.3 (L) 07/02/2022   Lab Results  Component Value Date   VD25OH 9.40 (L) 01/07/2022     Lab Results  Component Value Date   PREALBUMIN 16.5 (L) 01/07/2022      Latest Ref Rng & Units 07/11/2022    4:06 AM 07/10/2022    6:02 AM 07/09/2022    6:03 AM  CBC EXTENDED  WBC 4.0 - 10.5 K/uL 10.1  9.5    RBC 4.22 - 5.81 MIL/uL 3.63  3.50    Hemoglobin 13.0 - 17.0 g/dL 8.8  8.5  8.4   HCT 39.0 - 52.0 % 29.0  28.1  27.7   Platelets 150 - 400 K/uL 264  242    NEUT# 1.7 - 7.7 K/uL 6.0  5.6    Lymph# 0.7 - 4.0 K/uL 2.7  2.5       There is no height or weight on file to calculate BMI.  Orders:  No orders of the defined types were placed in this encounter.  No orders of the defined types were placed in this encounter.    Procedures: No procedures performed  Clinical Data: No additional findings.  ROS:  All other systems negative, except as noted in the HPI. Review of Systems  Objective: Vital Signs: There were no vitals taken for this visit.  Specialty Comments:  No specialty comments available.  PMFS History: Patient Active Problem List   Diagnosis Date Noted   Hx of iron deficiency anemia 07/07/2022   Syncope and collapse 06/15/2022   AMS (altered mental status) 04/14/2022   Acute respiratory failure with hypoxia ([place]) 99991111   Acute metabolic encephalopathy 99991111   Pneumonia 04/14/2022   Ulcer of right foot, limited to breakdown of skin ([place])    Below-knee amputation of left lower extremity ([place])    Osteomyelitis of foot, right, acute ([place]) 01/06/2022   Bipolar disorder with depression ([place])    Sepsis ([place]) 11/18/2021   Chest pain 11/18/2021   Dyspnea 11/18/2021   Wound infection    Ulcer of left foot with necrosis of bone ([place])    Bicuspid aortic valve 09/14/2021   Aneurysm of ascending aorta ([place]) 09/14/2021   Hyperkalemia 08/20/2021   DM (diabetes mellitus) with peripheral vascular complication ([place]) [id number]   Atypical chest pain 07/17/2021   Hyponatremia 07/17/2021   Leg wound, left 07/17/2021   Essential hypertension  07/17/2021   Acute hyperglycemia 07/09/2021   Non-ST elevation (NSTEMI) myocardial infarction ([place]) 07/22/2020   (HFpEF) heart failure with preserved ejection fraction ([place]) 07/22/2020   Hypotension 07/18/2020   Elevated troponin 07/18/2020   Acute renal failure superimposed on stage 3a chronic kidney disease ([place]) 07/18/2020   Elevated brain natriuretic peptide (BNP) level 07/18/2020   Unstable angina ([place]) 11/01/2019   Edema 06/13/2019   Leukocytosis 05/31/2019   Smoker 03/08/2019   Chronic back pain 02/16/2019   Deep venous thrombosis ([place]) 02/16/2019   Obesity, Class III, BMI 40-49.9 (morbid obesity) ([place]) 02/16/2019   Peripheral nerve disease 02/16/2019   COPD (chronic obstructive pulmonary disease) ([place]) 01/22/2019   Anxiety and depression 03/26/2018   Sinusitis 10/12/2017   Abdominal pain    Loss of weight    Diabetic foot infection (  [place]) 09/16/2017   Cellulitis of right foot    Recurrent chest pain 07/29/2016   Hyperglycemia    Insulin dependent type 2 diabetes mellitus ([place]) 04/29/2015   History of pulmonary embolus (PE) 04/27/2013   CAD (coronary artery disease) 09/10/2008   Past Medical History:  Diagnosis Date   Aneurysm of ascending aorta ([place]) 09/14/2021   Anxiety    Arthritis    "knees, shoulders, hips, ankles" (07/29/2016)   Asthma    Bicuspid aortic valve 09/14/2021   CAD (coronary artery disease)    a. 2017: s/p BMS to distal Cx; b. LHC 05/30/2019: 80% mid, distal RCA s/p DES, 30% narrowing of d LM, widely patent LAD w/ luminal irregularities, widely patent stent in [place]  w/ 90+% stenosis distal to stent beofre small trifurcating obtuse marginal (potentially area of restenosis)   Chronic bronchitis (HCC)    Chronic ulcer of great toe of right foot ([place]) 09/09/2017   Chronic ulcer of right great toe ([place]) 09/19/2017   COPD (chronic obstructive pulmonary disease) (HCC)    Coronary arteriosclerosis    Depression    Diabetes mellitus (HCC)    DVT (deep venous  thrombosis) (HCC)    GERD (gastroesophageal reflux disease)    Hyperlipidemia    Hypertension    Migraine    Morbid obesity ([place])    Myocardial infarction ([place]) 1996   "light one"   PE (pulmonary embolism) 04/2013   On chronic Xarelto   Peripheral nerve disease    Pneumonia "several times"   Sleep apnea    Type II diabetes mellitus ([place])     Family History  Problem Relation Age of Onset   Hypertension Brother    Hypertension Father    Diabetes Other    Hyperlipidemia Other     Past Surgical History:  Procedure Laterality Date   ABDOMINAL AORTOGRAM W/LOWER EXTREMITY N/A 10/10/2020   Procedure: ABDOMINAL AORTOGRAM W/LOWER EXTREMITY;  Surgeon: Elam Dutch, MD;  Location: [facility];  Service: Cardiovascular;  Laterality: N/A;   AMPUTATION Right 09/21/2017   Procedure: RIGHT GREAT TOE AMPUTATION, POSSIBLE VAC;  Surgeon: Leandrew Koyanagi, MD;  Location: [facility];  Service: Orthopedics;  Laterality: Right;   AMPUTATION Left 08/13/2020   Procedure: LEFT FOOT 4TH RAY AMPUTATION;  Surgeon: Newt Minion, MD;  Location: [facility];  Service: Orthopedics;  Laterality: Left;   AMPUTATION Left 11/20/2021   Procedure: LEFT TRANSMETATARSAL AMPUTATION;  Surgeon: Newt Minion, MD;  Location: [facility];  Service: Orthopedics;  Laterality: Left;   AMPUTATION Left 01/08/2022   Procedure: LEFT BELOW KNEE AMPUTATION;  Surgeon: Newt Minion, MD;  Location: [facility];  Service: Orthopedics;  Laterality: Left;   AMPUTATION Right 07/07/2022   Procedure: RIGHT BELOW KNEE AMPUTATION;  Surgeon: Newt Minion, MD;  Location: [facility];  Service: Orthopedics;  Laterality: Right;   AORTOGRAM Bilateral 03/13/2021   Procedure: ABDOMINAL AORTOGRAM WITH Left LOWER EXTREMITY RUNOFF;  Surgeon: Cherre Robins, MD;  Location: [facility];  Service: Vascular;  Laterality: Bilateral;   APPLICATION OF WOUND VAC  01/08/2022   Procedure: APPLICATION OF WOUND VAC;  Surgeon: Newt Minion, MD;  Location: [facility];  Service: Orthopedics;;    APPLICATION OF WOUND VAC Right 07/07/2022   Procedure: APPLICATION OF WOUND VAC;  Surgeon: Newt Minion, MD;  Location: [facility];  Service: Orthopedics;  Laterality: Right;   CARDIAC CATHETERIZATION  2006   CARDIAC CATHETERIZATION  1996   "@ [facility]; when I had my  heart attack"   CARDIAC CATHETERIZATION N/A 07/29/2016   Procedure: Left Heart Cath and Coronary Angiography;  Surgeon: Lorretta Harp, MD;  Location: [facility];  Service: Cardiovascular;  Laterality: N/A;   CARDIAC CATHETERIZATION N/A 07/29/2016   Procedure: Coronary Stent Intervention;  Surgeon: Lorretta Harp, MD;  Location: [facility];  Service: Cardiovascular;  Laterality: N/A;   CARPAL TUNNEL RELEASE Bilateral    CORONARY ANGIOPLASTY WITH STENT PLACEMENT  07/29/2016   CORONARY ARTERY BYPASS GRAFT N/A 07/24/2020   Procedure: CORONARY ARTERY BYPASS GRAFTING (CABG), ON PUMP, TIMES FOUR, USING LEFT INTERNAL MAMMARY ARTERY AND ENDOSCOPICALLY HARVESTED RIGHT GREATER SAPHENOUS VEIN;  Surgeon: Lajuana Matte, MD;  Location: [facility];  Service: Open Heart Surgery;  Laterality: N/A;  FLOW TAC   CORONARY STENT INTERVENTION N/A 05/30/2019   Procedure: CORONARY STENT INTERVENTION;  Surgeon: Belva Crome, MD;  Location: [facility];  Service: Cardiovascular;  Laterality: N/A;   CORONARY STENT INTERVENTION N/A 11/02/2019   Procedure: CORONARY STENT INTERVENTION;  Surgeon: Wellington Hampshire, MD;  Location: [facility];  Service: Cardiovascular;  Laterality: N/A;   ESOPHAGOGASTRODUODENOSCOPY N/A 09/22/2017   Procedure: ESOPHAGOGASTRODUODENOSCOPY (EGD);  Surgeon: Ladene Artist, MD;  Location: [facility];  Service: Endoscopy;  Laterality: N/A;   INTRAVASCULAR PRESSURE WIRE/FFR STUDY N/A 07/22/2020   Procedure: INTRAVASCULAR PRESSURE WIRE/FFR STUDY;  Surgeon: Leonie Man, MD;  Location: [facility];  Service: Cardiovascular;  Laterality: N/A;   KNEE ARTHROSCOPY Bilateral    "2 on left; 1 on the right"    LEFT HEART CATH AND CORONARY ANGIOGRAPHY N/A 11/02/2019   Procedure: LEFT HEART CATH AND CORONARY ANGIOGRAPHY;  Surgeon: Wellington Hampshire, MD;  Location: [facility];  Service: Cardiovascular;  Laterality: N/A;   LEFT HEART CATH AND CORONARY ANGIOGRAPHY N/A 07/22/2020   Procedure: LEFT HEART CATH AND CORONARY ANGIOGRAPHY;  Surgeon: Leonie Man, MD;  Location: [facility];  Service: Cardiovascular;  Laterality: N/A;   LEFT HEART CATH AND CORS/GRAFTS ANGIOGRAPHY N/A 08/17/2021   Procedure: LEFT HEART CATH AND CORS/GRAFTS ANGIOGRAPHY;  Surgeon: Nelva Bush, MD;  Location: [facility];  Service: Cardiovascular;  Laterality: N/A;   PERIPHERAL VASCULAR INTERVENTION Left 10/11/2020   popliteal and SFA stent placement    PERIPHERAL VASCULAR INTERVENTION Left 10/10/2020   Procedure: PERIPHERAL VASCULAR INTERVENTION;  Surgeon: Elam Dutch, MD;  Location: [facility];  Service: Cardiovascular;  Laterality: Left;   RIGHT/LEFT HEART CATH AND CORONARY ANGIOGRAPHY N/A 05/30/2019   Procedure: RIGHT/LEFT HEART CATH AND CORONARY ANGIOGRAPHY;  Surgeon: Belva Crome, MD;  Location: [facility];  Service: Cardiovascular;  Laterality: N/A;   RIGHT/LEFT HEART CATH AND CORONARY/GRAFT ANGIOGRAPHY N/A 06/17/2022   Procedure: RIGHT/LEFT HEART CATH AND CORONARY/GRAFT ANGIOGRAPHY;  Surgeon: Troy Sine, MD;  Location: [facility];  Service: Cardiovascular;  Laterality: N/A;   SHOULDER OPEN ROTATOR CUFF REPAIR Bilateral    TEE WITHOUT CARDIOVERSION N/A 07/24/2020   Procedure: TRANSESOPHAGEAL ECHOCARDIOGRAM (TEE);  Surgeon: Lajuana Matte, MD;  Location: [facility];  Service: Open Heart Surgery;  Laterality: N/A;   Social History   Occupational History   Occupation: disabled     Employer: UNEMPLOYED  Tobacco Use   Smoking status: Former    Packs/day: 1.00    Years: 34.00    Total pack years: 34.00    Types: Cigarettes    Quit date: 07/12/2020    Years since  quitting: 2.3   Smokeless tobacco: Former    Types: Snuff, Chew  Vaping Use   Vaping Use: Never used  Substance and Sexual Activity   Alcohol use: Yes    Comment: RARE   Drug use: No   Sexual activity: Yes

## 2023-01-12 NOTE — Progress Notes (Unsigned)
Office Visit    Patient Name: Joseph [name] Date of Encounter: 01/13/2023  Primary Care Provider:  Marva Panda, NP Primary Cardiologist:  Chilton Si, MD Primary Electrophysiologist: None  Chief Complaint   Joseph [name] is a 52 y.o. male with PMH of hx of CAD s/p dLCx and s/p CABG in 2021(LIMA-->LAD, SVG-->PDA, OM1, D1), PAD with stents to the R extremity with prior R and L toe amputations, chronic ulcer of both feet great toe, COPD with significant prior smoking history, HTN, HLD, ischemic cardiomyopathy, DM2 with diabetes neuropathy, prior hx of morbid Obesity, OSA unclear if on CPAP, hx of DVT and PE on Xarelto who presents today for 30-month follow-up of coronary artery disease.   Past Medical History    Past Medical History:  Diagnosis Date   Aneurysm of ascending aorta (HCC) 09/14/2021   Anxiety    Arthritis    "knees, shoulders, hips, ankles" (07/29/2016)   Asthma    Bicuspid aortic valve 09/14/2021   CAD (coronary artery disease)    a. 2017: s/p BMS to distal Cx; b. LHC 05/30/2019: 80% mid, distal RCA s/p DES, 30% narrowing of d LM, widely patent LAD w/ luminal irregularities, widely patent stent in dCX  w/ 90+% stenosis distal to stent beofre small trifurcating obtuse marginal (potentially area of restenosis)   Chronic bronchitis (HCC)    Chronic ulcer of great toe of right foot (HCC) 09/09/2017   Chronic ulcer of right great toe (HCC) 09/19/2017   COPD (chronic obstructive pulmonary disease) (HCC)    Coronary arteriosclerosis    Depression    Diabetes mellitus (HCC)    DVT (deep venous thrombosis) (HCC)    GERD (gastroesophageal reflux disease)    Hyperlipidemia    Hypertension    Migraine    Morbid obesity (HCC)    Myocardial infarction (HCC) 1996   "light one"   PE (pulmonary embolism) 04/2013   On chronic Xarelto   Peripheral nerve disease    Pneumonia "several times"   Sleep apnea    Type II diabetes mellitus (HCC)    Past Surgical  History:  Procedure Laterality Date   ABDOMINAL AORTOGRAM W/LOWER EXTREMITY N/A 10/10/2020   Procedure: ABDOMINAL AORTOGRAM W/LOWER EXTREMITY;  Surgeon: Sherren Kerns, MD;  [facility]: [facility];  Service: Cardiovascular;  Laterality: N/A;   AMPUTATION Right 09/21/2017   Procedure: RIGHT GREAT TOE AMPUTATION, POSSIBLE VAC;  Surgeon: Tarry Kos, MD;  Location: [facility] OR;  Service: Orthopedics;  Laterality: Right;   AMPUTATION Left 08/13/2020   Procedure: LEFT FOOT 4TH RAY AMPUTATION;  Surgeon: [name], MD;  Location: [name] OR;  Service: Orthopedics;  Laterality: Left;   AMPUTATION Left 11/20/2021   Procedure: LEFT TRANSMETATARSAL AMPUTATION;  Surgeon: [name], MD;  Location: [facility] OR;  Service: Orthopedics;  Laterality: Left;   AMPUTATION Left 01/08/2022   Procedure: LEFT BELOW KNEE AMPUTATION;  Surgeon: [name], MD;  Location: [facility] OR;  Service: Orthopedics;  Laterality: Left;   AMPUTATION Right 07/07/2022   Procedure: RIGHT BELOW KNEE AMPUTATION;  Surgeon: [name], MD;  Location: [facility] OR;  Service: Orthopedics;  Laterality: Right;   AORTOGRAM Bilateral 03/13/2021   Procedure: ABDOMINAL AORTOGRAM WITH Left LOWER EXTREMITY RUNOFF;  Surgeon: Leonie Douglas, MD;  Location: [facility] OR;  Service: Vascular;  Laterality: Bilateral;   APPLICATION OF WOUND VAC  01/08/2022   Procedure: APPLICATION OF WOUND VAC;  Surgeon: [name], MD;  Location: [facility] OR;  Service: Orthopedics;;   APPLICATION OF WOUND VAC Right 07/07/2022   Procedure: APPLICATION OF WOUND VAC;  Surgeon: [name], MD;  Location: [facility] OR;  Service: Orthopedics;  Laterality: Right;   CARDIAC CATHETERIZATION  2006   CARDIAC CATHETERIZATION  1996   "@ [facility]; when I had my heart attack"   CARDIAC CATHETERIZATION N/A 07/29/2016   Procedure: Left Heart Cath and Coronary Angiography;  Surgeon: Runell Gess, MD;  Location: [facility] INVASIVE CV LAB;  Service: Cardiovascular;  Laterality: N/A;   CARDIAC CATHETERIZATION N/A  07/29/2016   Procedure: Coronary Stent Intervention;  Surgeon: Runell Gess, MD;  [facility]: [facility];  Service: Cardiovascular;  Laterality: N/A;   CARPAL TUNNEL RELEASE Bilateral    CORONARY ANGIOPLASTY WITH STENT PLACEMENT  07/29/2016   CORONARY ARTERY BYPASS GRAFT N/A 07/24/2020   Procedure: CORONARY ARTERY BYPASS GRAFTING (CABG), ON PUMP, TIMES FOUR, USING LEFT INTERNAL MAMMARY ARTERY AND ENDOSCOPICALLY HARVESTED RIGHT GREATER SAPHENOUS VEIN;  Surgeon: Corliss Skains, MD;  Location: [facility] OR;  Service: Open Heart Surgery;  Laterality: N/A;  FLOW TAC   CORONARY STENT INTERVENTION N/A 05/30/2019   Procedure: CORONARY STENT INTERVENTION;  Surgeon: Lyn Records, MD;  [facility]: [facility];  Service: Cardiovascular;  Laterality: N/A;   CORONARY STENT INTERVENTION N/A 11/02/2019   Procedure: CORONARY STENT INTERVENTION;  Surgeon: Iran Ouch, MD;  [facility]: [facility];  Service: Cardiovascular;  Laterality: N/A;   ESOPHAGOGASTRODUODENOSCOPY N/A 09/22/2017   Procedure: ESOPHAGOGASTRODUODENOSCOPY (EGD);  Surgeon: Meryl Dare, MD;  Location: [facility] ENDOSCOPY;  Service: Endoscopy;  Laterality: N/A;   INTRAVASCULAR PRESSURE WIRE/FFR STUDY N/A 07/22/2020   Procedure: INTRAVASCULAR PRESSURE WIRE/FFR STUDY;  Surgeon: Marykay Lex, MD;  Location: [facility] INVASIVE CV LAB;  Service: Cardiovascular;  Laterality: N/A;   KNEE ARTHROSCOPY Bilateral    "2 on left; 1 on the right"   LEFT HEART CATH AND CORONARY ANGIOGRAPHY N/A 11/02/2019   Procedure: LEFT HEART CATH AND CORONARY ANGIOGRAPHY;  Surgeon: Iran Ouch, MD;  [facility]: [facility];  Service: Cardiovascular;  Laterality: N/A;   LEFT HEART CATH AND CORONARY ANGIOGRAPHY N/A 07/22/2020   Procedure: LEFT HEART CATH AND CORONARY ANGIOGRAPHY;  Surgeon: Marykay Lex, MD;  Location: [facility] INVASIVE CV LAB;  Service: Cardiovascular;  Laterality: N/A;   LEFT HEART CATH AND CORS/GRAFTS ANGIOGRAPHY N/A 08/17/2021    Procedure: LEFT HEART CATH AND CORS/GRAFTS ANGIOGRAPHY;  Surgeon: Yvonne Kendall, MD;  Location: [facility];  Service: Cardiovascular;  Laterality: N/A;   PERIPHERAL VASCULAR INTERVENTION Left 10/11/2020   popliteal and SFA stent placement    PERIPHERAL VASCULAR INTERVENTION Left 10/10/2020   Procedure: PERIPHERAL VASCULAR INTERVENTION;  Surgeon: Sherren Kerns, MD;  [facility]: [facility];  Service: Cardiovascular;  Laterality: Left;   RIGHT/LEFT HEART CATH AND CORONARY ANGIOGRAPHY N/A 05/30/2019   Procedure: RIGHT/LEFT HEART CATH AND CORONARY ANGIOGRAPHY;  Surgeon: Lyn Records, MD;  [facility]: [facility];  Service: Cardiovascular;  Laterality: N/A;   RIGHT/LEFT HEART CATH AND CORONARY/GRAFT ANGIOGRAPHY N/A 06/17/2022   Procedure: RIGHT/LEFT HEART CATH AND CORONARY/GRAFT ANGIOGRAPHY;  Surgeon: Lennette Bihari, MD;  Location: [facility];  Service: Cardiovascular;  Laterality: N/A;   SHOULDER OPEN ROTATOR CUFF REPAIR Bilateral    TEE WITHOUT CARDIOVERSION N/A 07/24/2020   Procedure: TRANSESOPHAGEAL ECHOCARDIOGRAM (TEE);  Surgeon: Corliss Skains, MD;  Location: [facility] OR;  Service: Open Heart Surgery;  Laterality: N/A;    Allergies  Allergies  Allergen Reactions   Sulfa Antibiotics Anaphylaxis and Other (See Comments)    Headaches     History of Present Illness    RUSHIL KIMBRELL  is a 52 year old male with the above mention past medical history who presents today for complaint of elevated blood pressure He was initially seen by Dr. Harrington Challenger in 2011 for complaint of chest pain.  He underwent Myoview in 2011 that was normal.  Patient had pulmonary embolism in 2014 and is currently on chronic Xarelto.  In 2017 patient had complaint of chest pain and underwent LHC which showed 80% lesion in distal circumflex that was treated with BMS.  2D echo was also completed with EF of 55-60% with LVH.  02/2019 patient presented with complaint of chest pain and lower extremity  edema.  Patient had echo and Myoview ordered.  Myoview was abnormal with EF of 40%.  He underwent diagnostic LHC on 05/2019 that showed patent circumflex stent with 90% distal stenosis, 80% mid RCA treated with DES x1.  Patient underwent CABG on 07/2020 (LIMA-->LAD, SVG-->PDA, OM1, D1).  He had 2 stents placed to popliteal and left SFA with partial amputation of his foot.  He underwent left below-knee amputation in 12/2021.  2D echo was also completed that showed EF of 55-60, no RWMA, moderate LVH, GRII DD, normal RVSF, trivial MR, no aortic stenosis, mild dilation of ascending aorta (43 mm), mild dilation of aortic root (42 mm). He was last seen by Richardson Dopp, PA on 05/2022 for complaint of chest pain.  Patient also endorsed shortness of breath with nausea and diaphoresis.  He took nitroglycerin with relief of symptoms.  He underwent right and left heart cath that showed no graft disease and normal right heart pressures with mean PA pressure of 21 mmHg.  He was continued on medical therapy.  Mr. Arturo Morton was seen on 10/08/22 for post hospital follow up.  Since his previous visit he reported some chest with relief following PRN nitroglycerine. He was advised to continue his antianginal medications and was also restarted on amlodipine which was discontinued previously.  Mr. Schwertner was seen today in follow-up with his wife since last being seen in the office patient reports that he has been having increased fatigue and chest pain that occurred twice that required nitroglycerin.  He is currently chest pain-free today at this visit.  His blood pressure today is 96/66 and heart rate is 83 bpm.  He had fluid volume excess at his PCP visit and he was started on Lasix 80 mg every day for 2 weeks.  He reports compliance with his current medication regimen and denies any adverse reactions.  Due to his toe amputations his volume is hard to ascertain however his neck veins are flat and lung sounds are clear on auscultation.  He  reports that his weight today is a guess and not accurate due to his lack of scales and inability to be weighed at our facility.  Patient denies chest pain, palpitations, dyspnea, PND, orthopnea, nausea, vomiting, dizziness, syncope, edema, weight gain, or early satiety.  Home Medications    Current Outpatient Medications  Medication Sig Dispense Refill   acetaminophen (TYLENOL) 500 MG tablet Take 1,000 mg by mouth 3 (three) times daily as needed for headache.     albuterol (VENTOLIN HFA) 108 (90 Base) MCG/ACT inhaler Inhale 2 puffs into the lungs every 6 (six) hours.     ALPRAZolam (XANAX) 1 MG tablet Take 1 mg by mouth at  bedtime as needed for anxiety. Pt takes 2 mg 2 tablet as needed.     amLODipine (NORVASC) 10 MG tablet Take 1 tablet (10 mg total) by mouth daily. 90 tablet 3   aspirin EC 81 MG tablet Take 81 mg by mouth in the morning. Swallow whole.     atorvastatin (LIPITOR) 80 MG tablet Take 1 tablet (80 mg total) by mouth daily at 6 PM. 30 tablet 2   buPROPion (WELLBUTRIN XL) 150 MG 24 hr tablet Take 150 mg by mouth in the morning and at bedtime.     calcium carbonate (TUMS EXTRA STRENGTH 750) 750 MG chewable tablet Chew 1,500 mg by mouth as needed (stomach pain).     carvedilol (COREG) 3.125 MG tablet Take 3.125 mg by mouth 2 (two) times daily.     clopidogrel (PLAVIX) 75 MG tablet Take 1 tablet (75 mg total) by mouth daily with breakfast. 30 tablet 3   Continuous Blood Gluc Sensor (FREESTYLE LIBRE 14 DAY SENSOR) MISC Apply topically 4 (four) times daily.     escitalopram (LEXAPRO) 10 MG tablet Take 10 mg by mouth in the morning.     fluconazole (DIFLUCAN) 100 MG tablet Take 1 tablet by mouth once a week.     fluticasone (FLONASE) 50 MCG/ACT nasal spray Place 2 sprays into both nostrils daily.     furosemide (LASIX) 80 MG tablet Take 80 mg by mouth daily. Pt is direct to take for only 2 weeks.     icosapent Ethyl (VASCEPA) 1 g capsule Take 2 capsules (2 g total) by mouth 2 (two) times  daily. 360 capsule 3   insulin aspart (NOVOLOG) 100 UNIT/ML injection Inject 3 Units into the skin 3 (three) times daily with meals. 10 mL 11   insulin glargine (LANTUS SOLOSTAR) 100 UNIT/ML Solostar Pen ADMINISTER 20 UNITS UNDER THE SKIN TWICE DAILY     insulin glargine-yfgn (SEMGLEE) 100 UNIT/ML injection Inject 0.2 mLs (20 Units total) into the skin 2 (two) times daily. 10 mL 11   loratadine (CLARITIN) 10 MG tablet Take 10 mg by mouth daily.     meclizine (ANTIVERT) 25 MG tablet Take 25 mg by mouth 3 (three) times daily as needed.     metFORMIN (GLUCOPHAGE) 1000 MG tablet Take 1 tablet (1,000 mg total) by mouth 2 (two) times daily with a meal.     naloxone (NARCAN) nasal spray 4 mg/0.1 mL Place 4 mg into the nose as needed (overdose).     nitroGLYCERIN (NITROSTAT) 0.4 MG SL tablet Place 1 tablet (0.4 mg total) under the tongue every 5 (five) minutes as needed. 25 tablet 3   oxyCODONE (ROXICODONE) 15 MG immediate release tablet Take 1 tablet (15 mg total) by mouth every 4 (four) hours as needed for moderate pain (pain score 4-6). 30 tablet 0   pantoprazole (PROTONIX) 40 MG tablet Take 1 tablet (40 mg total) by mouth daily. (Patient taking differently: Take 80 mg by mouth daily before breakfast.) 30 tablet 0   pregabalin (LYRICA) 150 MG capsule Take 150 mg by mouth 3 (three) times daily.     promethazine (PHENERGAN) 25 MG tablet Take 25 mg by mouth every 4 (four) hours as needed.     ranolazine (RANEXA) 500 MG 12 hr tablet TAKE 1 TABLET(500 MG) BY MOUTH TWICE DAILY 180 tablet 3   rosuvastatin (CRESTOR) 40 MG tablet Take 1 tablet every day by oral route.     Ubrogepant (UBRELVY) 100 MG TABS 100 mg oral once and  may repeat 100 mg oral in 2 hrs (max dose 200 mg in 24 hr period)     No current facility-administered medications for this visit.     Review of Systems  Please see the history of present illness.    (+) Fatigue (+) Chest pain  All other systems reviewed and are otherwise negative  except as noted above.  Physical Exam    Wt Readings from Last 3 Encounters:  01/13/23 (!) 400 lb (181.4 kg)  10/08/22 (!) 360 lb (163.3 kg)  07/22/22 (!) 362 lb (164.2 kg)   VS: Vitals:   01/13/23 1554  BP: 96/66  Pulse: 83  SpO2: 96%  ,Body mass index is 47.43 kg/m.  Constitutional:      Appearance: Healthy appearance. Not in distress.  Neck:     Vascular: JVD normal.  Pulmonary:     Effort: Pulmonary effort is normal.     Breath sounds: No wheezing. No rales. Diminished in the bases Cardiovascular:     Normal rate. Regular rhythm. Normal S1. Normal S2.      Murmurs: There is no murmur.  Edema:    Peripheral edema absent.  Abdominal:     Palpations: Abdomen is soft non tender. There is no hepatomegaly.  Skin:    General: Skin is warm and dry.  Neurological:     General: No focal deficit present.     Mental Status: Alert and oriented to person, place and time.     Cranial Nerves: Cranial nerves are intact.  EKG/LABS/Other Studies Reviewed    ECG personally reviewed by me today -none completed today   Lab Results  Component Value Date   WBC 10.1 07/11/2022   HGB 8.8 (L) 07/11/2022   HCT 29.0 (L) 07/11/2022   MCV 79.9 (L) 07/11/2022   PLT 264 07/11/2022   Lab Results  Component Value Date   CREATININE 1.12 07/11/2022   BUN 40 (H) 07/11/2022   NA 140 07/11/2022   K 4.9 07/11/2022   CL 112 (H) 07/11/2022   CO2 23 07/11/2022   Lab Results  Component Value Date   ALT 20 07/04/2022   AST 28 07/04/2022   ALKPHOS 73 07/04/2022   BILITOT 0.2 (L) 07/04/2022   Lab Results  Component Value Date   CHOL 189 08/16/2021   HDL 31 (L) 08/16/2021   LDLCALC 95 08/16/2021   LDLDIRECT 120.0 05/28/2015   TRIG 62 04/14/2022   CHOLHDL 6.1 08/16/2021    Lab Results  Component Value Date   HGBA1C 7.2 (H) 07/03/2022    Assessment & Plan    1.  Bicuspid aortic valve: -Patient had diastolic murmur auscultated on examination today. -He reports that he has been  experiencing increased fatigue and occasional bouts of chest discomfort. -Given his history of bicuspid aortic valve we will repeat 2D echo to evaluate for possible aortic regurgitation -We will check lab work and plan to start torsemide 20 mg due to patient's increased volume since previous visit   2.  Essential hypertension: -Patient's blood pressure today was well controlled at 96/66 -Continue current antihypertensive regimen as noted above   3.  HFpEF: -Patient's last 2D echo was completed with EF of 55-60, no RWMA, moderate LVH, GRII DD, normal RVSF, trivial MR, -Patient appears volume up on on exam today with no shortness of breath noted.  He was started on Lasix 80 mg for 3 weeks by his PCP -We will obtain BMET and BNP today with plan to switch him to  torsemide 20 mg daily -Patient had right and left heart cath that showed normal RV pressures -Continue current GDMT with carvedilol 3.125 mg,   4.  Hyperlipidemia: -Patient's last LDL was 95 -Continue atorvastatin 80 mg and Vascepa 2 g twice daily   5.  Dilated ascending aorta: -2D echo completed 12/2021 with 43 mm noted, patient will need repeat echo or CT for further evaluation  6. Coronary artery disease: -s/p CABG in 2021 with most recent right and left heart catheterization completed with clear grafts and normal RV pressure. -Today patient report 2 episodes of chest pain with elevated blood pressure spikes that have required nitroglycerin for relief. Continue GDMT with ASA 81 mg, Lipitor 80 mg daily, Plavix 75 mg daily, Ranexa 500 mg twice daily, carvedilol 3.125 mg twice daily  Disposition: Follow-up with Skeet Latch, MD or APP in 1 months    Medication Adjustments/Labs and Tests Ordered: Current medicines are reviewed at length with the patient today.  Concerns regarding medicines are outlined above.   Signed, Mable Fill, Marissa Nestle, NP 01/13/2023, 5:47 PM  [facility]  Note:  This document  was prepared using Dragon voice recognition software and may include unintentional dictation errors.

## 2023-01-13 ENCOUNTER — Encounter: Payer: Self-pay | Admitting: Nurse Practitioner

## 2023-01-13 ENCOUNTER — Ambulatory Visit: Payer: Medicare HMO | Attending: Nurse Practitioner | Admitting: Nurse Practitioner

## 2023-01-13 VITALS — BP 96/66 | HR 83 | Ht 77.0 in | Wt >= 6400 oz

## 2023-01-13 DIAGNOSIS — I10 Essential (primary) hypertension: Secondary | ICD-10-CM | POA: Diagnosis not present

## 2023-01-13 DIAGNOSIS — E785 Hyperlipidemia, unspecified: Secondary | ICD-10-CM | POA: Diagnosis not present

## 2023-01-13 DIAGNOSIS — Q231 Congenital insufficiency of aortic valve: Secondary | ICD-10-CM

## 2023-01-13 DIAGNOSIS — I5032 Chronic diastolic (congestive) heart failure: Secondary | ICD-10-CM | POA: Diagnosis not present

## 2023-01-13 DIAGNOSIS — I25119 Atherosclerotic heart disease of native coronary artery with unspecified angina pectoris: Secondary | ICD-10-CM | POA: Diagnosis not present

## 2023-01-13 NOTE — Patient Instructions (Signed)
Medication Instructions:  STOP Lasix  START Torsemide 20mg  Once a day**DO NOT START UNTIL YOU HEAR BACK FROM [place] ABOUT YOUR LABS ** *If you need a refill on your cardiac medications before your next appointment, please call your pharmacy*   Lab Work: TODAY-BMET & BNP If you have labs (blood work) drawn today and your tests are completely normal, you will receive your results only by: [place] (if you have MyChart) OR A paper copy in the mail If you have any lab test that is abnormal or we need to change your treatment, we will call you to review the results.   Testing/Procedures: Your physician has requested that you have an echocardiogram. Echocardiography is a painless test that uses sound waves to create images of your heart. It provides your doctor with information about the size and shape of your heart and how well your heart's chambers and valves are working. This procedure takes approximately one hour. There are no restrictions for this procedure. Please do NOT wear cologne, perfume, aftershave, or lotions (deodorant is allowed). Please arrive 15 minutes prior to your appointment time.   Follow-Up: At [facility], you and your health needs are our priority.  As part of our continuing mission to provide you with exceptional heart care, we have created designated Provider Care Teams.  These Care Teams include your primary Cardiologist (physician) and Advanced Practice Providers (APPs -  Physician Assistants and Nurse Practitioners) who all work together to provide you with the care you need, when you need it.  We recommend signing up for the patient portal called "MyChart".  Sign up information is provided on this After Visit Summary.  MyChart is used to connect with patients for Virtual Visits (Telemedicine).  Patients are able to view lab/test results, encounter notes, upcoming appointments, etc.  Non-urgent messages can be sent to your provider as well.   To learn more  about what you can do with MyChart, go to [email or web address].    Your next appointment:   1 month(s)  Provider:   Ambrose Pancoast, NP      Other Instructions

## 2023-01-14 ENCOUNTER — Telehealth: Payer: Self-pay

## 2023-01-14 MED ORDER — TORSEMIDE 20 MG PO TABS: 20.0000 mg | ORAL_TABLET | Freq: Two times a day (BID) | ORAL | 1 refills | Status: DC

## 2023-01-14 NOTE — Telephone Encounter (Signed)
Spoke with Jaquelyn Bitter NP who states he would like for the patient to start Torsemide 20mg  qd.   Contacted the patient but he did not answer left detailed message on voicemail.   Spoke with the patients wife and gave her Jaquelyn Bitter recommendations. She voiced understanding and thanked me for calling.  Rx(s) sent to pharmacy electronically.

## 2023-01-15 LAB — BASIC METABOLIC PANEL
BUN/Creatinine Ratio: 16 (ref 9–20)
BUN: 24 mg/dL (ref 6–24)
CO2: 20 mmol/L (ref 20–29)
Calcium: 8.3 mg/dL — ABNORMAL LOW (ref 8.7–10.2)
Chloride: 106 mmol/L (ref 96–106)
Creatinine, Ser: 1.49 mg/dL — ABNORMAL HIGH (ref 0.76–1.27)
Glucose: 107 mg/dL — ABNORMAL HIGH (ref 70–99)
Potassium: 4.3 mmol/L (ref 3.5–5.2)
Sodium: 142 mmol/L (ref 134–144)
eGFR: 57 mL/min/{1.73_m2} — ABNORMAL LOW (ref >59)

## 2023-01-15 LAB — PRO B NATRIURETIC PEPTIDE: NT-Pro BNP: 184 pg/mL — ABNORMAL HIGH (ref 0–121)

## 2023-01-27 ENCOUNTER — Encounter (HOSPITAL_COMMUNITY): Payer: Self-pay | Admitting: Nurse Practitioner

## 2023-01-27 ENCOUNTER — Ambulatory Visit (HOSPITAL_COMMUNITY): Admission: RE | Admit: 2023-01-27 | Payer: Medicare HMO | Source: Ambulatory Visit

## 2023-02-04 ENCOUNTER — Telehealth (HOSPITAL_BASED_OUTPATIENT_CLINIC_OR_DEPARTMENT_OTHER): Payer: Self-pay | Admitting: Cardiovascular Disease

## 2023-02-04 NOTE — Telephone Encounter (Signed)
Pt c/o of Chest Pain: STAT if CP now or developed within 24 hours  1. Are you having CP right now? No   2. Are you experiencing any other symptoms (ex. SOB, nausea, vomiting, sweating)? Jaw pain, hypertension, & nausea   3. How long have you been experiencing CP? Episode started last night   4. Is your CP continuous or coming and going? Coming and going   5. Have you taken Nitroglycerin? No  ?   BP this morning was 179/120 around. Last BP reading was 161/104 HR 108. Transferring to triage due to being considered STAT.

## 2023-02-04 NOTE — Telephone Encounter (Signed)
Transferred call from call center due to patient having chest pain.    Pain earlier today. He states it started last night. BP was 179/120 last night. He states he just didn't feel like taking a nitro because it gives him such a headache, took Xanax 58m and laid down.  Today right before he called his blood pressure 161/104 HR 108- about 20 minutes ago. He endorses jaw pain, SHOB, blurry vision, and headaches.   Advised patient to proceed to the emergency department for prompt evaluation, patient states "ill pass". Expressed importance of patient being seen due to the number of symptoms he is displaying.   He has follow up scheduled 3/5 with EAmbrose Pancoast NP at [address]. Advised patient that ED may request that he have sooner follow up, but he needs to be checked out there first.

## 2023-02-21 NOTE — Progress Notes (Deleted)
Office Visit    Patient Name: Joseph [name] Date of Encounter: 02/21/2023  Primary Care Provider:  [name], NP Primary Cardiologist:  Skeet Latch, MD Primary Electrophysiologist: None  Chief Complaint    Joseph [name] is a 52 y.o. male with PMH of ***  Past Medical History    Past Medical History:  Diagnosis Date   Aneurysm of ascending aorta ([place]) 09/14/2021   Anxiety    Arthritis    "knees, shoulders, hips, ankles" (07/29/2016)   Asthma    Bicuspid aortic valve 09/14/2021   CAD (coronary artery disease)    a. 2017: s/p BMS to distal Cx; b. LHC 05/30/2019: 80% mid, distal RCA s/p DES, 30% narrowing of d LM, widely patent LAD w/ luminal irregularities, widely patent stent in [place]  w/ 90+% stenosis distal to stent beofre small trifurcating obtuse marginal (potentially area of restenosis)   Chronic bronchitis (HCC)    Chronic ulcer of great toe of right foot ([place]) 09/09/2017   Chronic ulcer of right great toe ([place]) 09/19/2017   COPD (chronic obstructive pulmonary disease) (HCC)    Coronary arteriosclerosis    Depression    Diabetes mellitus ([place])    DVT (deep venous thrombosis) (HCC)    GERD (gastroesophageal reflux disease)    Hyperlipidemia    Hypertension    Migraine    Morbid obesity ([place])    Myocardial infarction ([place]) 1996   "light one"   PE (pulmonary embolism) 04/2013   On chronic Xarelto   Peripheral nerve disease    Pneumonia "several times"   Sleep apnea    Type II diabetes mellitus ([place])    Past Surgical History:  Procedure Laterality Date   ABDOMINAL AORTOGRAM W/LOWER EXTREMITY N/A 10/10/2020   Procedure: ABDOMINAL AORTOGRAM W/LOWER EXTREMITY;  Surgeon: Elam Dutch, MD;  Location: [facility];  Service: Cardiovascular;  Laterality: N/A;   AMPUTATION Right 09/21/2017   Procedure: RIGHT GREAT TOE AMPUTATION, POSSIBLE VAC;  Surgeon: Leandrew Koyanagi, MD;  Location: [facility];  Service: Orthopedics;  Laterality: Right;   AMPUTATION  Left 08/13/2020   Procedure: LEFT FOOT 4TH RAY AMPUTATION;  Surgeon: Newt Minion, MD;  Location: [place];  Service: Orthopedics;  Laterality: Left;   AMPUTATION Left 11/20/2021   Procedure: LEFT TRANSMETATARSAL AMPUTATION;  Surgeon: Newt Minion, MD;  Location: [facility];  Service: Orthopedics;  Laterality: Left;   AMPUTATION Left 01/08/2022   Procedure: LEFT BELOW KNEE AMPUTATION;  Surgeon: Newt Minion, MD;  Location: [place];  Service: Orthopedics;  Laterality: Left;   AMPUTATION Right 07/07/2022   Procedure: RIGHT BELOW KNEE AMPUTATION;  Surgeon: Newt Minion, MD;  Location: [place];  Service: Orthopedics;  Laterality: Right;   AORTOGRAM Bilateral 03/13/2021   Procedure: ABDOMINAL AORTOGRAM WITH Left LOWER EXTREMITY RUNOFF;  Surgeon: Cherre Robins, MD;  Location: [facility];  Service: Vascular;  Laterality: Bilateral;   APPLICATION OF WOUND VAC  01/08/2022   Procedure: APPLICATION OF WOUND VAC;  Surgeon: Newt Minion, MD;  Location: [place];  Service: Orthopedics;;   APPLICATION OF WOUND VAC Right 07/07/2022   Procedure: APPLICATION OF WOUND VAC;  Surgeon: Newt Minion, MD;  Location: [place];  Service: Orthopedics;  Laterality: Right;   CARDIAC CATHETERIZATION  2006   [place]   "@ [facility]; when I had my heart attack"   CARDIAC CATHETERIZATION N/A 07/29/2016   Procedure: Left Heart Cath and Coronary Angiography;  Surgeon: Pearletha Forge  Gwenlyn Found, MD;  Location: [facility];  Service: Cardiovascular;  Laterality: N/A;   CARDIAC CATHETERIZATION N/A 07/29/2016   Procedure: Coronary Stent Intervention;  Surgeon: Lorretta Harp, MD;  Location: [facility];  Service: Cardiovascular;  Laterality: N/A;   CARPAL TUNNEL RELEASE Bilateral    CORONARY ANGIOPLASTY WITH STENT PLACEMENT  07/29/2016   CORONARY ARTERY BYPASS GRAFT N/A 07/24/2020   Procedure: CORONARY ARTERY BYPASS GRAFTING (CABG), ON PUMP, TIMES FOUR, USING LEFT INTERNAL MAMMARY ARTERY AND ENDOSCOPICALLY HARVESTED RIGHT  GREATER SAPHENOUS VEIN;  Surgeon: Lajuana Matte, MD;  Location: [place];  Service: Open Heart Surgery;  Laterality: N/A;  FLOW TAC   CORONARY STENT INTERVENTION N/A 05/30/2019   Procedure: CORONARY STENT INTERVENTION;  Surgeon: Belva Crome, MD;  Location: [facility];  Service: Cardiovascular;  Laterality: N/A;   CORONARY STENT INTERVENTION N/A 11/02/2019   Procedure: CORONARY STENT INTERVENTION;  Surgeon: Wellington Hampshire, MD;  Location: [facility];  Service: Cardiovascular;  Laterality: N/A;   ESOPHAGOGASTRODUODENOSCOPY N/A 09/22/2017   Procedure: ESOPHAGOGASTRODUODENOSCOPY (EGD);  Surgeon: Ladene Artist, MD;  [facility]: [facility];  Service: Endoscopy;  Laterality: N/A;   INTRAVASCULAR PRESSURE WIRE/FFR STUDY N/A 07/22/2020   Procedure: INTRAVASCULAR PRESSURE WIRE/FFR STUDY;  Surgeon: Leonie Man, MD;  Location: [facility];  Service: Cardiovascular;  Laterality: N/A;   KNEE ARTHROSCOPY Bilateral    "2 on left; 1 on the right"   LEFT HEART CATH AND CORONARY ANGIOGRAPHY N/A 11/02/2019   Procedure: LEFT HEART CATH AND CORONARY ANGIOGRAPHY;  Surgeon: Wellington Hampshire, MD;  Location: [facility] [facility];  Service: Cardiovascular;  Laterality: N/A;   LEFT HEART CATH AND CORONARY ANGIOGRAPHY N/A 07/22/2020   Procedure: LEFT HEART CATH AND CORONARY ANGIOGRAPHY;  Surgeon: Leonie Man, MD;  Location: [place] [facility];  Service: Cardiovascular;  Laterality: N/A;   LEFT HEART CATH AND CORS/GRAFTS ANGIOGRAPHY N/A 08/17/2021   Procedure: LEFT HEART CATH AND CORS/GRAFTS ANGIOGRAPHY;  Surgeon: Nelva Bush, MD;  Location: [facility] [facility];  Service: Cardiovascular;  Laterality: N/A;   PERIPHERAL VASCULAR INTERVENTION Left 10/11/2020   popliteal and SFA stent placement    PERIPHERAL VASCULAR INTERVENTION Left 10/10/2020   Procedure: PERIPHERAL VASCULAR INTERVENTION;  Surgeon: Elam Dutch, MD;  Location: [place] [facility];  Service: Cardiovascular;   Laterality: Left;   RIGHT/LEFT HEART CATH AND CORONARY ANGIOGRAPHY N/A 05/30/2019   Procedure: RIGHT/LEFT HEART CATH AND CORONARY ANGIOGRAPHY;  Surgeon: Belva Crome, MD;  Location: [place] [facility];  Service: Cardiovascular;  Laterality: N/A;   RIGHT/LEFT HEART CATH AND CORONARY/GRAFT ANGIOGRAPHY N/A 06/17/2022   Procedure: RIGHT/LEFT HEART CATH AND CORONARY/GRAFT ANGIOGRAPHY;  Surgeon: Troy Sine, MD;  Location: [place] [facility];  Service: Cardiovascular;  Laterality: N/A;   SHOULDER OPEN ROTATOR CUFF REPAIR Bilateral    TEE WITHOUT CARDIOVERSION N/A 07/24/2020   Procedure: TRANSESOPHAGEAL ECHOCARDIOGRAM (TEE);  Surgeon: Lajuana Matte, MD;  Location: [place];  Service: Open Heart Surgery;  Laterality: N/A;    Allergies  Allergies  Allergen Reactions   Sulfa Antibiotics Anaphylaxis and Other (See Comments)    Headaches     History of Present Illness    Snowden Fafard Judy  is a *** year old *** with the above mention past medical history who presents today for**      Since last being seen in the office patient reports***.  Patient denies chest pain, palpitations, dyspnea, PND, orthopnea, nausea, vomiting,  dizziness, syncope, edema, weight gain, or early satiety.     ***Notes:  Home Medications    Current Outpatient Medications  Medication Sig Dispense Refill   acetaminophen (TYLENOL) 500 MG tablet Take 1,000 mg by mouth 3 (three) times daily as needed for headache.     albuterol (VENTOLIN HFA) 108 (90 Base) MCG/ACT inhaler Inhale 2 puffs into the lungs every 6 (six) hours.     ALPRAZolam (XANAX) 1 MG tablet Take 1 mg by mouth at bedtime as needed for anxiety. Pt takes 2 mg 2 tablet as needed.     amLODipine (NORVASC) 10 MG tablet Take 1 tablet (10 mg total) by mouth daily. 90 tablet 3   aspirin EC 81 MG tablet Take 81 mg by mouth in the morning. Swallow whole.     atorvastatin (LIPITOR) 80 MG tablet Take 1 tablet (80 mg total) by mouth daily at 6 PM. 30 tablet 2    buPROPion (WELLBUTRIN XL) 150 MG 24 hr tablet Take 150 mg by mouth in the morning and at bedtime.     calcium carbonate (TUMS EXTRA STRENGTH 750) 750 MG chewable tablet Chew 1,500 mg by mouth as needed (stomach pain).     carvedilol (COREG) 3.125 MG tablet Take 3.125 mg by mouth 2 (two) times daily.     clopidogrel (PLAVIX) 75 MG tablet Take 1 tablet (75 mg total) by mouth daily with breakfast. 30 tablet 3   Continuous Blood Gluc Sensor (FREESTYLE LIBRE 14 DAY SENSOR) MISC Apply topically 4 (four) times daily.     escitalopram (LEXAPRO) 10 MG tablet Take 10 mg by mouth in the morning.     fluconazole (DIFLUCAN) 100 MG tablet Take 1 tablet by mouth once a week.     fluticasone (FLONASE) 50 MCG/ACT nasal spray Place 2 sprays into both nostrils daily.     furosemide (LASIX) 80 MG tablet Take 80 mg by mouth daily. Pt is direct to take for only 2 weeks.     icosapent Ethyl (VASCEPA) 1 g capsule Take 2 capsules (2 g total) by mouth 2 (two) times daily. 360 capsule 3   insulin aspart (NOVOLOG) 100 UNIT/ML injection Inject 3 Units into the skin 3 (three) times daily with meals. 10 mL 11   insulin glargine (LANTUS SOLOSTAR) 100 UNIT/ML Solostar Pen ADMINISTER 20 UNITS UNDER THE SKIN TWICE DAILY     insulin glargine-yfgn (SEMGLEE) 100 UNIT/ML injection Inject 0.2 mLs (20 Units total) into the skin 2 (two) times daily. 10 mL 11   loratadine (CLARITIN) 10 MG tablet Take 10 mg by mouth daily.     meclizine (ANTIVERT) 25 MG tablet Take 25 mg by mouth 3 (three) times daily as needed.     metFORMIN (GLUCOPHAGE) 1000 MG tablet Take 1 tablet (1,000 mg total) by mouth 2 (two) times daily with a meal.     naloxone (NARCAN) nasal spray 4 mg/0.1 mL Place 4 mg into the nose as needed (overdose).     nitroGLYCERIN (NITROSTAT) 0.4 MG SL tablet Place 1 tablet (0.4 mg total) under the tongue every 5 (five) minutes as needed. 25 tablet 3   oxyCODONE (ROXICODONE) 15 MG immediate release tablet Take 1 tablet (15 mg total) by  mouth every 4 (four) hours as needed for moderate pain (pain score 4-6). 30 tablet 0   pantoprazole (PROTONIX) 40 MG tablet Take 1 tablet (40 mg total) by mouth daily. (Patient taking differently: Take 80 mg by mouth daily before breakfast.) 30 tablet 0  pregabalin (LYRICA) 150 MG capsule Take 150 mg by mouth 3 (three) times daily.     promethazine (PHENERGAN) 25 MG tablet Take 25 mg by mouth every 4 (four) hours as needed.     ranolazine (RANEXA) 500 MG 12 hr tablet TAKE 1 TABLET(500 MG) BY MOUTH TWICE DAILY 180 tablet 3   rosuvastatin (CRESTOR) 40 MG tablet Take 1 tablet every day by oral route.     torsemide (DEMADEX) 20 MG tablet Take 1 tablet (20 mg total) by mouth 2 (two) times daily. 30 tablet 1   Ubrogepant (UBRELVY) 100 MG TABS 100 mg oral once and may repeat 100 mg oral in 2 hrs (max dose 200 mg in 24 hr period)     No current facility-administered medications for this visit.     Review of Systems  Please see the history of present illness.    (+)*** (+)***  All other systems reviewed and are otherwise negative except as noted above.  Physical Exam    Wt Readings from Last 3 Encounters:  01/13/23 (!) 400 lb (181.4 kg)  10/08/22 (!) 360 lb (163.3 kg)  07/22/22 (!) 362 lb (164.2 kg)   [id number] were no vitals filed for this visit.,There is no height or weight on file to calculate BMI.  Constitutional:      Appearance: Healthy appearance. Not in distress.  Neck:     Vascular: JVD normal.  Pulmonary:     Effort: Pulmonary effort is normal.     Breath sounds: No wheezing. No rales. Diminished in the bases Cardiovascular:     Normal rate. Regular rhythm. Normal S1. Normal S2.      Murmurs: There is no murmur.  Edema:    Peripheral edema absent.  Abdominal:     Palpations: Abdomen is soft non tender. There is no hepatomegaly.  Skin:    General: Skin is warm and dry.  Neurological:     General: No focal deficit present.     Mental Status: Alert and oriented to person,  place and time.     Cranial Nerves: Cranial nerves are intact.  EKG/LABS/ Recent Cardiac Studies    ECG personally reviewed by me today - ***  Risk Assessment/Calculations:   {Does this patient have ATRIAL FIBRILLATION?:[phone]}        Lab Results  Component Value Date   WBC 10.1 07/11/2022   HGB 8.8 (L) 07/11/2022   HCT 29.0 (L) 07/11/2022   MCV 79.9 (L) 07/11/2022   PLT 264 07/11/2022   Lab Results  Component Value Date   CREATININE 1.49 (H) 01/13/2023   BUN 24 01/13/2023   NA 142 01/13/2023   K 4.3 01/13/2023   CL 106 01/13/2023   CO2 20 01/13/2023   Lab Results  Component Value Date   ALT 20 07/04/2022   AST 28 07/04/2022   ALKPHOS 73 07/04/2022   BILITOT 0.2 (L) 07/04/2022   Lab Results  Component Value Date   CHOL 189 08/16/2021   HDL 31 (L) 08/16/2021   LDLCALC 95 08/16/2021   LDLDIRECT 120.0 05/28/2015   TRIG 62 04/14/2022   CHOLHDL 6.1 08/16/2021    Lab Results  Component Value Date   HGBA1C 7.2 (H) 07/03/2022    Cardiac Studies & Procedures   CARDIAC CATHETERIZATION  CARDIAC CATHETERIZATION 06/17/2022  Narrative   Mid LM to Ost LAD lesion is 80% stenosed with 40% stenosed side branch in Ost Cx to Prox Cx.   Mid Cx-2 lesion is 5% stenosed.  Dist Cx lesion is 70% stenosed.   Mid Cx-1 lesion is 100% stenosed.   Prox RCA to Mid RCA lesion is 30% stenosed.   Dist RCA lesion is 50% stenosed with 50% stenosed side branch in RPDA.   Prox RCA lesion is 80% stenosed.   Prox Graft to Mid Graft lesion is 30% stenosed.   1st Diag lesion is 40% stenosed.   3rd Diag lesion is 60% stenosed.   Mid LAD lesion is 50% stenosed.   SVG and is large.   SVG and is normal in caliber.   SVG and is large.   The graft exhibits no disease.   The graft exhibits no disease.  Severe native CAD with 80% distal left main stenosis with widely patent LIMA graft supplying the LAD and SVG supplying the diagonal vessel.  The native circumflex is totally occluded with  a widely patent vein graft supplying the distal OM vessel.  The native dominant RCA has 80% focal proximal stenosis with mild in-stent restenosis in the previously placed mid stent with 50% narrowing at the ostium of the PDA with widely patent SVG to PDA.  Normal right heart pressures with mean PA pressure at 21 mmHg/  RECOMMENDATION: Medical therapy.  Continue long-term DAPT.  Optimal blood pressure control with target blood pressure less than 130/80.  Aggressive lipid-lowering therapy with target LDL less than 55.  Findings Coronary Findings Diagnostic  Dominance: Right  Left Main Vessel is large. Mid LM to Ost LAD lesion is 80% stenosed with 40% stenosed side branch in Ost Cx to Prox Cx. The lesion is concentric. The lesion is mildly calcified. Ostial and proximal LCx disease is mildly calcified, eccentric and irregular  Left Anterior Descending There is mild diffuse disease throughout the vessel. Mid LAD lesion is 50% stenosed.  First Diagonal Branch Vessel is small in size. 1st Diag lesion is 40% stenosed.  Third Diagonal Branch Vessel is small in size. 3rd Diag lesion is 60% stenosed. The lesion is focal and eccentric.  Left Circumflex Vessel is normal in caliber. The vessel is moderately calcified. The vessel is tortuous. The distal vessel terminates as a branching lateral OM into OM 2-OM 3 Mid Cx-1 lesion is 100% stenosed. Competitive flow from SVG-OM noted. Mid Cx-2 lesion is 5% stenosed. The lesion was previously treated using a bare metal stent and a drug eluting stent between 1-2 years ago. Overlapping stents Dist Cx lesion is 70% stenosed.  First Obtuse Marginal Branch Vessel is small in size.  Fourth Obtuse Marginal Branch Vessel is small in size.  Left Posterior Atrioventricular Artery Vessel is small in size.  Right Coronary Artery Prox RCA lesion is 80% stenosed. Prox RCA to Mid RCA lesion is 30% stenosed. The lesion was previously treated using a drug  eluting stent . Dist RCA lesion is 50% stenosed with 50% stenosed side branch in RPDA.  Acute Marginal Branch Vessel is small in size.  Right Posterior Atrioventricular Artery Vessel is small in size.  First Right Posterolateral Branch Vessel is small in size.  Saphenous Graft To RPDA SVG and is large. Prox Graft to Mid Graft lesion is 30% stenosed.  Saphenous Graft To 3rd Diag SVG and is normal in caliber.  The graft exhibits no disease.  Saphenous Graft To Dist Cx SVG and is large.  The graft exhibits no disease.  LIMA Graft To Mid LAD  Intervention  No interventions have been documented.   CARDIAC CATHETERIZATION  CARDIAC CATHETERIZATION 08/17/2021  Narrative Conclusions: Severe  native coronary artery disease, as detailed below, including functional occlusions of the mid LAD and mid LCx with competitive flow from bypass grafts, 70% distal LMCA stenosis, and multifocal RCA disease of up to 70%. Widely patent LIMA-LAD, SVG-D3, and SVG-OM2. Patent SVG-RPDA with 30% proximal/mid graft stenosis. Moderately elevated left heart filling pressure with significant respiratory variation (LVEDP 30 mmHg end-expiratory, 20-64mHg mean).  Recommendations: No targets for PCI evident on today's catheterization.  Optimize medical therapy; will add isosorbide mononitrate in place of nitroglycerin infusion. Diuresis given elevated LVEDP. Aggressive secondary prevention.  CNelva Bush MD [facility]  Findings Coronary Findings Diagnostic  Dominance: Right  Left Main Vessel is large. Mid LM to Ost LAD lesion is 70% stenosed with 55% stenosed side branch in Ost Cx to Prox Cx. The lesion is concentric. The lesion is mildly calcified. Ostial and proximal LCx disease is mildly calcified, eccentric and irregular  Left Anterior Descending There is mild diffuse disease throughout the vessel. Mid LAD lesion is 100% stenosed. Competitive flow noted from SVG-D and  LIMA-LAD.  First Diagonal Branch Vessel is small in size. 1st Diag lesion is 60% stenosed.  Third Diagonal Branch Vessel is small in size. 3rd Diag lesion is 60% stenosed. The lesion is focal and eccentric.  Left Circumflex Vessel is normal in caliber. The vessel is moderately calcified. The vessel is tortuous. The distal vessel terminates as a branching lateral OM into OM 2-OM 3 Mid Cx-1 lesion is 100% stenosed. Competitive flow from SVG-OM noted. Mid Cx-2 lesion is 5% stenosed. The lesion was previously treated using a bare metal stent and a drug eluting stent between 1-2 years ago. Overlapping stents Dist Cx lesion is 70% stenosed.  First Obtuse Marginal Branch Vessel is small in size.  Fourth Obtuse Marginal Branch Vessel is small in size.  Left Posterior Atrioventricular Artery Vessel is small in size.  Right Coronary Artery Prox RCA lesion is 70% stenosed. Prox RCA to Mid RCA lesion is 40% stenosed. The lesion was previously treated using a drug eluting stent . Dist RCA lesion is 50% stenosed with 50% stenosed side branch in RPDA.  Acute Marginal Branch Vessel is small in size.  Right Posterior Atrioventricular Artery Vessel is small in size.  First Right Posterolateral Branch Vessel is small in size.  Saphenous Graft To RPDA SVG graft was visualized by angiography and is large. Prox Graft to Mid Graft lesion is 30% stenosed.  Saphenous Graft To 3rd Diag SVG graft was visualized by angiography and is normal in caliber.  The graft exhibits no disease.  Saphenous Graft To Dist Cx SVG graft was visualized by angiography and is large.  The graft exhibits no disease.  LIMA LIMA Graft To Dist LAD LIMA graft was visualized by angiography and is normal in caliber.  The graft exhibits no disease.  Intervention  No interventions have been documented.   STRESS TESTS  MYOCARDIAL PERFUSION IMAGING 05/23/2019  Narrative  The left ventricular ejection fraction is  moderately decreased (30-44%).  Nuclear stress EF: 44%. Mild diffuse hypokinesis. Left ventricular hypertrophy pattern noted.  This is a intermediate risk study based upon reduced EF. No ischemia.  No evidence of circumflex ischemia (prior PCI).  MCandee Furbish MD   ECHOCARDIOGRAM  ECHOCARDIOGRAM COMPLETE 01/07/2022  Narrative ECHOCARDIOGRAM REPORT    [name]: [facility] Medical Rec #:  [id number]      Height:       77.0 in Accession #:  [id number]      Weight:       434.0 lb Date of Birth:  [birth year]       BSA:          3.109 m Patient Age:    73 years        BP:           98/69 mmHg Patient Gender: M               HR:           74 bpm. Exam Location:  Inpatient  Procedure: 2D Echo, Cardiac Doppler and Color Doppler  Indications:    R55 Syncope  History:        Patient has prior history of Echocardiogram examinations, most recent 08/16/2021. CAD, COPD, Signs/Symptoms:Hypotension, Chest Pain and Dyspnea; Risk Factors:Diabetes and Hypertension.  Sonographer:    Glo Herring Referring Phys: [place]   1. Bicuspid aortic valve with fusion of left and right coronary cusps. 2. Left ventricular ejection fraction, by estimation, is 55 to 60%. The left ventricle has normal function. The left ventricle has no regional wall motion abnormalities. There is moderate left ventricular hypertrophy. Left ventricular diastolic parameters are consistent with Grade II diastolic dysfunction (pseudonormalization). 3. Right ventricular systolic function is normal. The right ventricular size is normal. 4. The mitral valve is normal in structure. Trivial mitral valve regurgitation. No evidence of mitral stenosis. 5. The aortic valve is bicuspid. Aortic valve regurgitation is not visualized. No aortic stenosis is present. 6. Aortic dilatation noted. There is mild dilatation of the aortic root, measuring 42 mm. There is mild  dilatation of the ascending aorta, measuring 43 mm. 7. The inferior vena cava is dilated in size with <50% respiratory variability, suggesting right atrial pressure of 15 mmHg.  FINDINGS Left Ventricle: Left ventricular ejection fraction, by estimation, is 55 to 60%. The left ventricle has normal function. The left ventricle has no regional wall motion abnormalities. The left ventricular internal cavity size was normal in size. There is moderate left ventricular hypertrophy. Left ventricular diastolic parameters are consistent with Grade II diastolic dysfunction (pseudonormalization).  Right Ventricle: The right ventricular size is normal. Right ventricular systolic function is normal.  Left Atrium: Left atrial size was normal in size.  Right Atrium: Right atrial size was normal in size.  Pericardium: There is no evidence of pericardial effusion.  Mitral Valve: The mitral valve is normal in structure. Mild mitral annular calcification. Trivial mitral valve regurgitation. No evidence of mitral valve stenosis.  Tricuspid Valve: The tricuspid valve is normal in structure. Tricuspid valve regurgitation is not demonstrated. No evidence of tricuspid stenosis.  Aortic Valve: The aortic valve is bicuspid. Aortic valve regurgitation is not visualized. No aortic stenosis is present. Aortic valve mean gradient measures 6.5 mmHg. Aortic valve peak gradient measures 12.2 mmHg. Aortic valve area, by VTI measures 2.17 cm.  Pulmonic Valve: The pulmonic valve was normal in structure. Pulmonic valve regurgitation is not visualized. No evidence of pulmonic stenosis.  Aorta: Aortic dilatation noted. There is mild dilatation of the aortic root, measuring 42 mm. There is mild dilatation of the ascending aorta, measuring 43 mm.  Venous: The inferior vena cava is dilated in size with less than 50% respiratory variability, suggesting right atrial pressure of 15 mmHg.  IAS/Shunts: No atrial level shunt detected  by color flow Doppler.  Additional Comments: Bicuspid aortic valve with fusion of left and right coronary cusps.   LEFT VENTRICLE  PLAX 2D LVIDd:         5.10 cm   Diastology LVIDs:         3.95 cm   LV e' medial:    5.66 cm/s LV PW:         1.70 cm   LV E/e' medial:  17.7 LV IVS:        1.70 cm   LV e' lateral:   7.51 cm/s LVOT diam:     2.50 cm   LV E/e' lateral: 13.3 LV SV:         80 LV SV Index:   26 LVOT Area:     4.91 cm   RIGHT VENTRICLE            IVC RV S prime:     7.94 cm/s  IVC diam: 3.50 cm  LEFT ATRIUM           Index LA diam:      3.90 cm 1.25 cm/m LA Vol (A2C): 49.7 ml 15.99 ml/m LA Vol (A4C): 73.4 ml 23.61 ml/m AORTIC VALVE                     PULMONIC VALVE AV Area (Vmax):    2.17 cm      PV Vmax:       1.00 m/s AV Area (Vmean):   2.19 cm      PV Peak grad:  4.0 mmHg AV Area (VTI):     2.17 cm AV Vmax:           175.00 cm/s AV Vmean:          122.500 cm/s AV VTI:            0.367 m AV Peak Grad:      12.2 mmHg AV Mean Grad:      6.5 mmHg LVOT Vmax:         77.40 cm/s LVOT Vmean:        54.650 cm/s LVOT VTI:          0.162 m LVOT/AV VTI ratio: 0.44  AORTA Ao Root diam: 4.20 cm  MITRAL VALVE MV Area (PHT): 3.74 cm     SHUNTS MV Decel Time: 203 msec     Systemic VTI:  0.16 m MV E velocity: 100.00 cm/s  Systemic Diam: 2.50 cm MV A velocity: 80.40 cm/s MV E/A ratio:  1.24  Kirk Ruths MD Electronically signed by Kirk Ruths MD Signature Date/Time: 01/07/2022/1:31:29 PM    Final   TEE  ECHO INTRAOPERATIVE TEE 07/24/2020  Narrative *INTRAOPERATIVE TRANSESOPHAGEAL REPORT *    Patient Name:   JARDON HELLWEGE [facility] Date of Exam: 07/24/2020 Medical Rec #:  [id number]       Height:       77.0 in Accession #:    [id number]      Weight:       368.6 lb Date of Birth:  14-Oct-1971       BSA:          2.90 m Patient Age:    88 years        BP:           117/91 mmHg Patient Gender: M               HR:           67 bpm. Exam Location:   Anesthesiology  Transesophogeal exam was perform intraoperatively during surgical procedure. Patient was closely monitored under general  anesthesia during the entirety of examination.  Indications:     Coronary artery disease Sonographer:     Darlina Sicilian RDCS Performing Phys: [id number] Lucile Crater [employer] Diagnosing Phys: Belinda Block MD  POST-OP IMPRESSIONS - Left Ventricle: Post Bypass: The patient came off bypass on the initial attempt. There did not appear to be any new findings from preop exam. Left ventricular contraction did improve with volume replacement. The TEE that had been placed uneventfully after induction was removed without difficulty at the end of the case. The patient was later taken to the SICU in stable conditon.  PRE-OP FINDINGS Left Ventricle: The left ventricle has low normal systolic function, with an ejection fraction of 50-55%.  Right Ventricle: The right ventricle has normal systolic function.  Left Atrium: The left atrial appendage is well visualized and there is no evidence of thrombus present.   Mitral Valve: The mitral valve is normal in structure. Mitral valve regurgitation is trivial by color flow Doppler.  Tricuspid Valve: The tricuspid valve was normal in structure.  Aortic Valve: The aortic valve is tricuspid Aortic valve regurgitation was not visualized by color flow Doppler.  Pulmonic Valve: The pulmonic valve was normal in structure.    Aorta: The ascending aorta is normal in size and structure.   Belinda Block MD Electronically signed by Belinda Block MD Signature Date/Time: 07/24/2020/6:13:18 PM   Final            Assessment & Plan    1.***  2.***  3.***  4.***      Disposition: Follow-up with Skeet Latch, MD or APP in *** months {Are you ordering a CV Procedure (e.g. stress test, cath, DCCV, TEE, etc)?   Press F2        :[id number]   Medication Adjustments/Labs and Tests Ordered: Current medicines are  reviewed at length with the patient today.  Concerns regarding medicines are outlined above.   Signed, Mable Fill, Marissa Nestle, NP 02/21/2023, 11:30 AM [facility]  Note:  This document was prepared using Dragon voice recognition software and may include unintentional dictation errors.

## 2023-02-22 ENCOUNTER — Ambulatory Visit: Payer: Medicare HMO | Admitting: Nurse Practitioner

## 2023-04-03 NOTE — Progress Notes (Deleted)
Office Visit    Patient Name: Joseph [name] Date of Encounter: 04/03/2023  Primary Care Provider:  Marva Panda, NP Primary Cardiologist:  Chilton Si, MD Primary Electrophysiologist: None  Chief Complaint    Joseph [name] is a 52 y.o. male with PMH of hx of CAD s/p dLCx and s/p CABG in 2021(LIMA-->LAD, SVG-->PDA, OM1, D1), PAD with stents to the R extremity with prior R and L toe amputations, chronic ulcer of both feet great toe, COPD with significant prior smoking history, HTN, HLD, ischemic cardiomyopathy, DM2 with diabetes neuropathy, prior hx of morbid Obesity, OSA unclear if on CPAP, hx of DVT and PE on Xarelto who presents today for follow-up of CAD.  Past Medical History    Past Medical History:  Diagnosis Date   Aneurysm of ascending aorta (HCC) 09/14/2021   Anxiety    Arthritis    "knees, shoulders, hips, ankles" (07/29/2016)   Asthma    Bicuspid aortic valve 09/14/2021   CAD (coronary artery disease)    a. 2017: s/p BMS to distal Cx; b. LHC 05/30/2019: 80% mid, distal RCA s/p DES, 30% narrowing of d LM, widely patent LAD w/ luminal irregularities, widely patent stent in dCX  w/ 90+% stenosis distal to stent beofre small trifurcating obtuse marginal (potentially area of restenosis)   Chronic bronchitis (HCC)    Chronic ulcer of great toe of right foot (HCC) 09/09/2017   Chronic ulcer of right great toe (HCC) 09/19/2017   COPD (chronic obstructive pulmonary disease) (HCC)    Coronary arteriosclerosis    Depression    Diabetes mellitus (HCC)    DVT (deep venous thrombosis) (HCC)    GERD (gastroesophageal reflux disease)    Hyperlipidemia    Hypertension    Migraine    Morbid obesity (HCC)    Myocardial infarction (HCC) 1996   "light one"   PE (pulmonary embolism) 04/2013   On chronic Xarelto   Peripheral nerve disease    Pneumonia "several times"   Sleep apnea    Type II diabetes mellitus (HCC)    Past Surgical History:  Procedure Laterality  Date   ABDOMINAL AORTOGRAM W/LOWER EXTREMITY N/A 10/10/2020   Procedure: ABDOMINAL AORTOGRAM W/LOWER EXTREMITY;  Surgeon: Sherren Kerns, MD;  Location: [facility];  Service: Cardiovascular;  Laterality: N/A;   AMPUTATION Right 09/21/2017   Procedure: RIGHT GREAT TOE AMPUTATION, POSSIBLE VAC;  Surgeon: Tarry Kos, MD;  Location: [facility] OR;  Service: Orthopedics;  Laterality: Right;   AMPUTATION Left 08/13/2020   Procedure: LEFT FOOT 4TH RAY AMPUTATION;  Surgeon: [name], MD;  Location: [facility] OR;  Service: Orthopedics;  Laterality: Left;   AMPUTATION Left 11/20/2021   Procedure: LEFT TRANSMETATARSAL AMPUTATION;  Surgeon: [name], MD;  Location: [facility] OR;  Service: Orthopedics;  Laterality: Left;   AMPUTATION Left 01/08/2022   Procedure: LEFT BELOW KNEE AMPUTATION;  Surgeon: [name], MD;  Location: [facility] OR;  Service: Orthopedics;  Laterality: Left;   AMPUTATION Right 07/07/2022   Procedure: RIGHT BELOW KNEE AMPUTATION;  Surgeon: [name], MD;  Location: [facility] OR;  Service: Orthopedics;  Laterality: Right;   AORTOGRAM Bilateral 03/13/2021   Procedure: ABDOMINAL AORTOGRAM WITH Left LOWER EXTREMITY RUNOFF;  Surgeon: Leonie Douglas, MD;  Location: [facility] OR;  Service: Vascular;  Laterality: Bilateral;   APPLICATION OF WOUND VAC  01/08/2022   Procedure: APPLICATION OF WOUND VAC;  Surgeon: [name], MD;  Location: [facility] OR;  Service: Orthopedics;;  APPLICATION OF WOUND VAC Right 07/07/2022   Procedure: APPLICATION OF WOUND VAC;  Surgeon: [name], MD;  Location: [facility] OR;  Service: Orthopedics;  Laterality: Right;   CARDIAC CATHETERIZATION  2006   CARDIAC CATHETERIZATION  1996   "@ [facility]; when I had my heart attack"   CARDIAC CATHETERIZATION N/A 07/29/2016   Procedure: Left Heart Cath and Coronary Angiography;  Surgeon: Runell Gess, MD;  Location: [facility] INVASIVE CV LAB;  Service: Cardiovascular;  Laterality: N/A;   CARDIAC CATHETERIZATION N/A 07/29/2016   Procedure: Coronary  Stent Intervention;  Surgeon: Runell Gess, MD;  [facility]: [facility];  Service: Cardiovascular;  Laterality: N/A;   CARPAL TUNNEL RELEASE Bilateral    CORONARY ANGIOPLASTY WITH STENT PLACEMENT  07/29/2016   CORONARY ARTERY BYPASS GRAFT N/A 07/24/2020   Procedure: CORONARY ARTERY BYPASS GRAFTING (CABG), ON PUMP, TIMES FOUR, USING LEFT INTERNAL MAMMARY ARTERY AND ENDOSCOPICALLY HARVESTED RIGHT GREATER SAPHENOUS VEIN;  Surgeon: Corliss Skains, MD;  Location: [facility] OR;  Service: Open Heart Surgery;  Laterality: N/A;  FLOW TAC   CORONARY PRESSURE/FFR STUDY N/A 07/22/2020   Procedure: INTRAVASCULAR PRESSURE WIRE/FFR STUDY;  Surgeon: Marykay Lex, MD;  Location: [facility] INVASIVE CV LAB;  Service: Cardiovascular;  Laterality: N/A;   CORONARY STENT INTERVENTION N/A 05/30/2019   Procedure: CORONARY STENT INTERVENTION;  Surgeon: Lyn Records, MD;  [facility]: [facility];  Service: Cardiovascular;  Laterality: N/A;   CORONARY STENT INTERVENTION N/A 11/02/2019   Procedure: CORONARY STENT INTERVENTION;  Surgeon: Iran Ouch, MD;  [facility]: [facility];  Service: Cardiovascular;  Laterality: N/A;   ESOPHAGOGASTRODUODENOSCOPY N/A 09/22/2017   Procedure: ESOPHAGOGASTRODUODENOSCOPY (EGD);  Surgeon: Meryl Dare, MD;  Location: [facility] ENDOSCOPY;  Service: Endoscopy;  Laterality: N/A;   KNEE ARTHROSCOPY Bilateral    "2 on left; 1 on the right"   LEFT HEART CATH AND CORONARY ANGIOGRAPHY N/A 11/02/2019   Procedure: LEFT HEART CATH AND CORONARY ANGIOGRAPHY;  Surgeon: Iran Ouch, MD;  [facility]: [facility];  Service: Cardiovascular;  Laterality: N/A;   LEFT HEART CATH AND CORONARY ANGIOGRAPHY N/A 07/22/2020   Procedure: LEFT HEART CATH AND CORONARY ANGIOGRAPHY;  Surgeon: Marykay Lex, MD;  Location: [facility] INVASIVE CV LAB;  Service: Cardiovascular;  Laterality: N/A;   LEFT HEART CATH AND CORS/GRAFTS ANGIOGRAPHY N/A 08/17/2021   Procedure: LEFT HEART CATH AND CORS/GRAFTS  ANGIOGRAPHY;  Surgeon: Joseph Kendall, MD;  [facility]: [facility];  Service: Cardiovascular;  Laterality: N/A;   PERIPHERAL VASCULAR INTERVENTION Left 10/11/2020   popliteal and SFA stent placement    PERIPHERAL VASCULAR INTERVENTION Left 10/10/2020   Procedure: PERIPHERAL VASCULAR INTERVENTION;  Surgeon: Sherren Kerns, MD;  [facility]: [facility];  Service: Cardiovascular;  Laterality: Left;   RIGHT/LEFT HEART CATH AND CORONARY ANGIOGRAPHY N/A 05/30/2019   Procedure: RIGHT/LEFT HEART CATH AND CORONARY ANGIOGRAPHY;  Surgeon: Lyn Records, MD;  [facility]: [facility];  Service: Cardiovascular;  Laterality: N/A;   RIGHT/LEFT HEART CATH AND CORONARY/GRAFT ANGIOGRAPHY N/A 06/17/2022   Procedure: RIGHT/LEFT HEART CATH AND CORONARY/GRAFT ANGIOGRAPHY;  Surgeon: Lennette Bihari, MD;  [facility]: [facility];  Service: Cardiovascular;  Laterality: N/A;   SHOULDER OPEN ROTATOR CUFF REPAIR Bilateral    TEE WITHOUT CARDIOVERSION N/A 07/24/2020   Procedure: TRANSESOPHAGEAL ECHOCARDIOGRAM (TEE);  Surgeon: Corliss Skains, MD;  Location: [facility] OR;  Service: Open Heart Surgery;  Laterality: N/A;    Allergies  Allergies  Allergen Reactions  Sulfa Antibiotics Anaphylaxis and Other (See Comments)    Headaches     History of Present Illness    Joseph [name]  is a 52 year old male with the above mention past medical history who presents today for follow-up of CAD.  He was initially seen by Dr. Tenny Craw in 2011 for complaint of chest pain.  He underwent Myoview in 2011 that was normal.  Patient had pulmonary embolism in 2014 and is currently on chronic Xarelto.  In 2017 patient had complaint of chest pain and underwent LHC which showed 80% lesion in distal circumflex that was treated with BMS.  2D echo was also completed with EF of 55-60% with LVH.  02/2019 patient presented with complaint of chest pain and lower extremity edema.  Patient had echo and Myoview ordered.  Myoview was  abnormal with EF of 40%.  He underwent diagnostic LHC on 05/2019 that showed patent circumflex stent with 90% distal stenosis, 80% mid RCA treated with DES x1.  Patient underwent CABG on 07/2020 (LIMA-->LAD, SVG-->PDA, OM1, D1).  He had 2 stents placed to popliteal and left SFA with partial amputation of his foot.  He underwent left below-knee amputation in 12/2021.  2D echo was also completed that showed EF of 55-60, no RWMA, moderate LVH, GRII DD, normal RVSF, trivial MR, no aortic stenosis, mild dilation of ascending aorta (43 mm), mild dilation of aortic root (42 mm). He was last seen by Tereso Newcomer, PA on 05/2022 for complaint of chest pain.  Patient also endorsed shortness of breath with nausea and diaphoresis.  He took nitroglycerin with relief of symptoms.  He underwent right and left heart cath that showed no graft disease and normal right heart pressures with mean PA pressure of 21 mmHg.  He was continued on medical therapy.  Joseph [name] was seen on 10/08/22 for post hospital follow up.  Since his previous visit he reported some chest with relief following PRN nitroglycerine. He was advised to continue his antianginal medications and was also restarted on amlodipine which was discontinued previously.  He was last seen 01/13/2023 for follow-up and reported increased fatigue and chest pain.  During his visit he was chest pain-free and blood pressures were stable at 96/66.  He was noted to have elevated fluid volume at PCP and was started on Lasix 80 for 2 weeks.  2D echo was ordered to evaluate bicuspid AV however patient did not complete test.  He was started on torsemide 20 mg.  Since last being seen in the office patient reports***.  Patient denies chest pain, palpitations, dyspnea, PND, orthopnea, nausea, vomiting, dizziness, syncope, edema, weight gain, or early satiety.     ***Notes:  Home Medications    Current Outpatient Medications  Medication Sig Dispense Refill   acetaminophen (TYLENOL)  500 MG tablet Take 1,000 mg by mouth 3 (three) times daily as needed for headache.     albuterol (VENTOLIN HFA) 108 (90 Base) MCG/ACT inhaler Inhale 2 puffs into the lungs every 6 (six) hours.     ALPRAZolam (XANAX) 1 MG tablet Take 1 mg by mouth at bedtime as needed for anxiety. Pt takes 2 mg 2 tablet as needed.     amLODipine (NORVASC) 10 MG tablet Take 1 tablet (10 mg total) by mouth daily. 90 tablet 3   aspirin EC 81 MG tablet Take 81 mg by mouth in the morning. Swallow whole.     atorvastatin (LIPITOR) 80 MG tablet Take 1 tablet (80 mg total) by  mouth daily at 6 PM. 30 tablet 2   buPROPion (WELLBUTRIN XL) 150 MG 24 hr tablet Take 150 mg by mouth in the morning and at bedtime.     calcium carbonate (TUMS EXTRA STRENGTH 750) 750 MG chewable tablet Chew 1,500 mg by mouth as needed (stomach pain).     carvedilol (COREG) 3.125 MG tablet Take 3.125 mg by mouth 2 (two) times daily.     clopidogrel (PLAVIX) 75 MG tablet Take 1 tablet (75 mg total) by mouth daily with breakfast. 30 tablet 3   Continuous Blood Gluc Sensor (FREESTYLE LIBRE 14 DAY SENSOR) MISC Apply topically 4 (four) times daily.     escitalopram (LEXAPRO) 10 MG tablet Take 10 mg by mouth in the morning.     fluconazole (DIFLUCAN) 100 MG tablet Take 1 tablet by mouth once a week.     fluticasone (FLONASE) 50 MCG/ACT nasal spray Place 2 sprays into both nostrils daily.     furosemide (LASIX) 80 MG tablet Take 80 mg by mouth daily. Pt is direct to take for only 2 weeks.     icosapent Ethyl (VASCEPA) 1 g capsule Take 2 capsules (2 g total) by mouth 2 (two) times daily. 360 capsule 3   insulin aspart (NOVOLOG) 100 UNIT/ML injection Inject 3 Units into the skin 3 (three) times daily with meals. 10 mL 11   insulin glargine (LANTUS SOLOSTAR) 100 UNIT/ML Solostar Pen ADMINISTER 20 UNITS UNDER THE SKIN TWICE DAILY     insulin glargine-yfgn (SEMGLEE) 100 UNIT/ML injection Inject 0.2 mLs (20 Units total) into the skin 2 (two) times daily. 10 mL 11    loratadine (CLARITIN) 10 MG tablet Take 10 mg by mouth daily.     meclizine (ANTIVERT) 25 MG tablet Take 25 mg by mouth 3 (three) times daily as needed.     metFORMIN (GLUCOPHAGE) 1000 MG tablet Take 1 tablet (1,000 mg total) by mouth 2 (two) times daily with a meal.     naloxone (NARCAN) nasal spray 4 mg/0.1 mL Place 4 mg into the nose as needed (overdose).     nitroGLYCERIN (NITROSTAT) 0.4 MG SL tablet Place 1 tablet (0.4 mg total) under the tongue every 5 (five) minutes as needed. 25 tablet 3   oxyCODONE (ROXICODONE) 15 MG immediate release tablet Take 1 tablet (15 mg total) by mouth every 4 (four) hours as needed for moderate pain (pain score 4-6). 30 tablet 0   pantoprazole (PROTONIX) 40 MG tablet Take 1 tablet (40 mg total) by mouth daily. (Patient taking differently: Take 80 mg by mouth daily before breakfast.) 30 tablet 0   pregabalin (LYRICA) 150 MG capsule Take 150 mg by mouth 3 (three) times daily.     promethazine (PHENERGAN) 25 MG tablet Take 25 mg by mouth every 4 (four) hours as needed.     ranolazine (RANEXA) 500 MG 12 hr tablet TAKE 1 TABLET(500 MG) BY MOUTH TWICE DAILY 180 tablet 3   rosuvastatin (CRESTOR) 40 MG tablet Take 1 tablet every day by oral route.     torsemide (DEMADEX) 20 MG tablet Take 1 tablet (20 mg total) by mouth 2 (two) times daily. 30 tablet 1   Ubrogepant (UBRELVY) 100 MG TABS 100 mg oral once and may repeat 100 mg oral in 2 hrs (max dose 200 mg in 24 hr period)     No current facility-administered medications for this visit.     Review of Systems  Please see the history of present illness.    (+)*** (+)***  All other systems reviewed and are otherwise negative except as noted above.  Physical Exam    Wt Readings from Last 3 Encounters:  01/13/23 (!) 400 lb (181.4 kg)  10/08/22 (!) 360 lb (163.3 kg)  07/22/22 (!) 362 lb (164.2 kg)   NF:AOZHY were no vitals filed for this visit.,There is no height or weight on file to calculate  BMI.  Constitutional:      Appearance: Healthy appearance. Not in distress.  Neck:     Vascular: JVD normal.  Pulmonary:     Effort: Pulmonary effort is normal.     Breath sounds: No wheezing. No rales. Diminished in the bases Cardiovascular:     Normal rate. Regular rhythm. Normal S1. Normal S2.      Murmurs: There is no murmur.  Edema:    Peripheral edema absent.  Abdominal:     Palpations: Abdomen is soft non tender. There is no hepatomegaly.  Skin:    General: Skin is warm and dry.  Neurological:     General: No focal deficit present.     Mental Status: Alert and oriented to person, place and time.     Cranial Nerves: Cranial nerves are intact.  EKG/LABS/ Recent Cardiac Studies    ECG personally reviewed by me today - ***  Risk Assessment/Calculations:   {Does this patient have ATRIAL FIBRILLATION?[phone]}        Lab Results  Component Value Date   WBC 10.1 07/11/2022   HGB 8.8 (L) 07/11/2022   HCT 29.0 (L) 07/11/2022   MCV 79.9 (L) 07/11/2022   PLT 264 07/11/2022   Lab Results  Component Value Date   CREATININE 1.49 (H) 01/13/2023   BUN 24 01/13/2023   NA 142 01/13/2023   K 4.3 01/13/2023   CL 106 01/13/2023   CO2 20 01/13/2023   Lab Results  Component Value Date   ALT 20 07/04/2022   AST 28 07/04/2022   ALKPHOS 73 07/04/2022   BILITOT 0.2 (L) 07/04/2022   Lab Results  Component Value Date   CHOL 189 08/16/2021   HDL 31 (L) 08/16/2021   LDLCALC 95 08/16/2021   LDLDIRECT 120.0 05/28/2015   TRIG 62 04/14/2022   CHOLHDL 6.1 08/16/2021    Lab Results  Component Value Date   HGBA1C 7.2 (H) 07/03/2022    Cardiac Studies & Procedures   CARDIAC CATHETERIZATION  CARDIAC CATHETERIZATION 06/17/2022  Narrative   Mid LM to Ost LAD lesion is 80% stenosed with 40% stenosed side branch in Ost Cx to Prox Cx.   Mid Cx-2 lesion is 5% stenosed.   Dist Cx lesion is 70% stenosed.   Mid Cx-1 lesion is 100% stenosed.   Prox RCA to Mid RCA lesion is 30%  stenosed.   Dist RCA lesion is 50% stenosed with 50% stenosed side branch in RPDA.   Prox RCA lesion is 80% stenosed.   Prox Graft to Mid Graft lesion is 30% stenosed.   1st Diag lesion is 40% stenosed.   3rd Diag lesion is 60% stenosed.   Mid LAD lesion is 50% stenosed.   SVG and is large.   SVG and is normal in caliber.   SVG and is large.   The graft exhibits no disease.   The graft exhibits no disease.  Severe native CAD with 80% distal left main stenosis with widely patent LIMA graft supplying the LAD and SVG supplying the diagonal vessel.  The native circumflex is totally occluded with a widely patent vein graft  supplying the distal OM vessel.  The native dominant RCA has 80% focal proximal stenosis with mild in-stent restenosis in the previously placed mid stent with 50% narrowing at the ostium of the PDA with widely patent SVG to PDA.  Normal right heart pressures with mean PA pressure at 21 mmHg/  RECOMMENDATION: Medical therapy.  Continue long-term DAPT.  Optimal blood pressure control with target blood pressure less than 130/80.  Aggressive lipid-lowering therapy with target LDL less than 55.  Findings Coronary Findings Diagnostic  Dominance: Right  Left Main Vessel is large. Mid LM to Ost LAD lesion is 80% stenosed with 40% stenosed side branch in Ost Cx to Prox Cx. The lesion is concentric. The lesion is mildly calcified. Ostial and proximal LCx disease is mildly calcified, eccentric and irregular  Left Anterior Descending There is mild diffuse disease throughout the vessel. Mid LAD lesion is 50% stenosed.  First Diagonal Branch Vessel is small in size. 1st Diag lesion is 40% stenosed.  Third Diagonal Branch Vessel is small in size. 3rd Diag lesion is 60% stenosed. The lesion is focal and eccentric.  Left Circumflex Vessel is normal in caliber. The vessel is moderately calcified. The vessel is tortuous. The distal vessel terminates as a branching lateral OM  into OM 2-OM 3 Mid Cx-1 lesion is 100% stenosed. Competitive flow from SVG-OM noted. Mid Cx-2 lesion is 5% stenosed. The lesion was previously treated using a bare metal stent and a drug eluting stent between 1-2 years ago. Overlapping stents Dist Cx lesion is 70% stenosed.  First Obtuse Marginal Branch Vessel is small in size.  Fourth Obtuse Marginal Branch Vessel is small in size.  Left Posterior Atrioventricular Artery Vessel is small in size.  Right Coronary Artery Prox RCA lesion is 80% stenosed. Prox RCA to Mid RCA lesion is 30% stenosed. The lesion was previously treated using a drug eluting stent . Dist RCA lesion is 50% stenosed with 50% stenosed side branch in RPDA.  Acute Marginal Branch Vessel is small in size.  Right Posterior Atrioventricular Artery Vessel is small in size.  First Right Posterolateral Branch Vessel is small in size.  Saphenous Graft To RPDA SVG and is large. Prox Graft to Mid Graft lesion is 30% stenosed.  Saphenous Graft To 3rd Diag SVG and is normal in caliber.  The graft exhibits no disease.  Saphenous Graft To Dist Cx SVG and is large.  The graft exhibits no disease.  LIMA Graft To Mid LAD  Intervention  No interventions have been documented.   CARDIAC CATHETERIZATION  CARDIAC CATHETERIZATION 08/17/2021  Narrative Conclusions: Severe native coronary artery disease, as detailed below, including functional occlusions of the mid LAD and mid LCx with competitive flow from bypass grafts, 70% distal LMCA stenosis, and multifocal RCA disease of up to 70%. Widely patent LIMA-LAD, SVG-D3, and SVG-OM2. Patent SVG-RPDA with 30% proximal/mid graft stenosis. Moderately elevated left heart filling pressure with significant respiratory variation (LVEDP 30 mmHg end-expiratory, 20-27mmHg mean).  Recommendations: No targets for PCI evident on today's catheterization.  Optimize medical therapy; will add isosorbide mononitrate in place of  nitroglycerin infusion. Diuresis given elevated LVEDP. Aggressive secondary prevention.  Joseph Kendall, MD [facility]  Findings Coronary Findings Diagnostic  Dominance: Right  Left Main Vessel is large. Mid LM to Ost LAD lesion is 70% stenosed with 55% stenosed side branch in Ost Cx to Prox Cx. The lesion is concentric. The lesion is mildly calcified. Ostial and proximal LCx disease is mildly calcified, eccentric and irregular  Left Anterior Descending There is mild diffuse disease throughout the vessel. Mid LAD lesion is 100% stenosed. Competitive flow noted from SVG-D and LIMA-LAD.  First Diagonal Branch Vessel is small in size. 1st Diag lesion is 60% stenosed.  Third Diagonal Branch Vessel is small in size. 3rd Diag lesion is 60% stenosed. The lesion is focal and eccentric.  Left Circumflex Vessel is normal in caliber. The vessel is moderately calcified. The vessel is tortuous. The distal vessel terminates as a branching lateral OM into OM 2-OM 3 Mid Cx-1 lesion is 100% stenosed. Competitive flow from SVG-OM noted. Mid Cx-2 lesion is 5% stenosed. The lesion was previously treated using a bare metal stent and a drug eluting stent between 1-2 years ago. Overlapping stents Dist Cx lesion is 70% stenosed.  First Obtuse Marginal Branch Vessel is small in size.  Fourth Obtuse Marginal Branch Vessel is small in size.  Left Posterior Atrioventricular Artery Vessel is small in size.  Right Coronary Artery Prox RCA lesion is 70% stenosed. Prox RCA to Mid RCA lesion is 40% stenosed. The lesion was previously treated using a drug eluting stent . Dist RCA lesion is 50% stenosed with 50% stenosed side branch in RPDA.  Acute Marginal Branch Vessel is small in size.  Right Posterior Atrioventricular Artery Vessel is small in size.  First Right Posterolateral Branch Vessel is small in size.  Saphenous Graft To RPDA SVG graft was visualized by angiography and is  large. Prox Graft to Mid Graft lesion is 30% stenosed.  Saphenous Graft To 3rd Diag SVG graft was visualized by angiography and is normal in caliber.  The graft exhibits no disease.  Saphenous Graft To Dist Cx SVG graft was visualized by angiography and is large.  The graft exhibits no disease.  LIMA LIMA Graft To Dist LAD LIMA graft was visualized by angiography and is normal in caliber.  The graft exhibits no disease.  Intervention  No interventions have been documented.   STRESS TESTS  MYOCARDIAL PERFUSION IMAGING 05/23/2019  Narrative  The left ventricular ejection fraction is moderately decreased (30-44%).  Nuclear stress EF: 44%. Mild diffuse hypokinesis. Left ventricular hypertrophy pattern noted.  This is a intermediate risk study based upon reduced EF. No ischemia.  No evidence of circumflex ischemia (prior PCI).  Joseph Schultz, MD   ECHOCARDIOGRAM  ECHOCARDIOGRAM COMPLETE 01/07/2022  Narrative ECHOCARDIOGRAM REPORT    Patient Name:   Joseph [name] [facility] Date of Exam: 01/07/2022 Medical Rec #:  [id number]       Height:       77.0 in Accession #:    [id number]      Weight:       434.0 lb Date of Birth:  [birth year]       BSA:          3.109 m Patient Age:    50 years        BP:           98/69 mmHg Patient Gender: M               HR:           74 bpm. Exam Location:  Inpatient  Procedure: 2D Echo, Cardiac Doppler and Color Doppler  Indications:    R55 Syncope  History:        Patient has prior history of Echocardiogram examinations, most recent 08/16/2021. CAD, COPD, Signs/Symptoms:Hypotension, Chest Pain and Dyspnea; Risk Factors:Diabetes and Hypertension.  Sonographer:    Vanetta Shawl Referring Phys: [phone]  MARSHALL L CHAMBLISS  IMPRESSIONS   1. Bicuspid aortic valve with fusion of left and right coronary cusps. 2. Left ventricular ejection fraction, by estimation, is 55 to 60%. The left ventricle has normal function. The left ventricle has no regional  wall motion abnormalities. There is moderate left ventricular hypertrophy. Left ventricular diastolic parameters are consistent with Grade II diastolic dysfunction (pseudonormalization). 3. Right ventricular systolic function is normal. The right ventricular size is normal. 4. The mitral valve is normal in structure. Trivial mitral valve regurgitation. No evidence of mitral stenosis. 5. The aortic valve is bicuspid. Aortic valve regurgitation is not visualized. No aortic stenosis is present. 6. Aortic dilatation noted. There is mild dilatation of the aortic root, measuring 42 mm. There is mild dilatation of the ascending aorta, measuring 43 mm. 7. The inferior vena cava is dilated in size with <50% respiratory variability, suggesting right atrial pressure of 15 mmHg.  FINDINGS Left Ventricle: Left ventricular ejection fraction, by estimation, is 55 to 60%. The left ventricle has normal function. The left ventricle has no regional wall motion abnormalities. The left ventricular internal cavity size was normal in size. There is moderate left ventricular hypertrophy. Left ventricular diastolic parameters are consistent with Grade II diastolic dysfunction (pseudonormalization).  Right Ventricle: The right ventricular size is normal. Right ventricular systolic function is normal.  Left Atrium: Left atrial size was normal in size.  Right Atrium: Right atrial size was normal in size.  Pericardium: There is no evidence of pericardial effusion.  Mitral Valve: The mitral valve is normal in structure. Mild mitral annular calcification. Trivial mitral valve regurgitation. No evidence of mitral valve stenosis.  Tricuspid Valve: The tricuspid valve is normal in structure. Tricuspid valve regurgitation is not demonstrated. No evidence of tricuspid stenosis.  Aortic Valve: The aortic valve is bicuspid. Aortic valve regurgitation is not visualized. No aortic stenosis is present. Aortic valve mean gradient  measures 6.5 mmHg. Aortic valve peak gradient measures 12.2 mmHg. Aortic valve area, by VTI measures 2.17 cm.  Pulmonic Valve: The pulmonic valve was normal in structure. Pulmonic valve regurgitation is not visualized. No evidence of pulmonic stenosis.  Aorta: Aortic dilatation noted. There is mild dilatation of the aortic root, measuring 42 mm. There is mild dilatation of the ascending aorta, measuring 43 mm.  Venous: The inferior vena cava is dilated in size with less than 50% respiratory variability, suggesting right atrial pressure of 15 mmHg.  IAS/Shunts: No atrial level shunt detected by color flow Doppler.  Additional Comments: Bicuspid aortic valve with fusion of left and right coronary cusps.   LEFT VENTRICLE PLAX 2D LVIDd:         5.10 cm   Diastology LVIDs:         3.95 cm   LV e' medial:    5.66 cm/s LV PW:         1.70 cm   LV E/e' medial:  17.7 LV IVS:        1.70 cm   LV e' lateral:   7.51 cm/s LVOT diam:     2.50 cm   LV E/e' lateral: 13.3 LV SV:         80 LV SV Index:   26 LVOT Area:     4.91 cm   RIGHT VENTRICLE            IVC RV S prime:     7.94 cm/s  IVC diam: 3.50 cm  LEFT ATRIUM  Index LA diam:      3.90 cm 1.25 cm/m LA Vol (A2C): 49.7 ml 15.99 ml/m LA Vol (A4C): 73.4 ml 23.61 ml/m AORTIC VALVE                     PULMONIC VALVE AV Area (Vmax):    2.17 cm      PV Vmax:       1.00 m/s AV Area (Vmean):   2.19 cm      PV Peak grad:  4.0 mmHg AV Area (VTI):     2.17 cm AV Vmax:           175.00 cm/s AV Vmean:          122.500 cm/s AV VTI:            0.367 m AV Peak Grad:      12.2 mmHg AV Mean Grad:      6.5 mmHg LVOT Vmax:         77.40 cm/s LVOT Vmean:        54.650 cm/s LVOT VTI:          0.162 m LVOT/AV VTI ratio: 0.44  AORTA Ao Root diam: 4.20 cm  MITRAL VALVE MV Area (PHT): 3.74 cm     SHUNTS MV Decel Time: 203 msec     Systemic VTI:  0.16 m MV E velocity: 100.00 cm/s  Systemic Diam: 2.50 cm MV A velocity: 80.40  cm/s MV E/A ratio:  1.24  Joseph Millers MD Electronically signed by Joseph Millers MD Signature Date/Time: 01/07/2022/1:31:29 PM    Final   TEE  ECHO INTRAOPERATIVE TEE 07/24/2020  Narrative *INTRAOPERATIVE TRANSESOPHAGEAL REPORT *    Patient Name:   Joseph [name] Date of Exam: 07/24/2020 Medical Rec #:  [id number]       Height:       77.0 in Accession #:    [id number]      Weight:       368.6 lb Date of Birth:  June 07, 1971       BSA:          2.90 m Patient Age:    49 years        BP:           117/91 mmHg Patient Gender: M               HR:           67 bpm. Exam Location:  Anesthesiology  Transesophogeal exam was perform intraoperatively during surgical procedure. Patient was closely monitored under general anesthesia during the entirety of examination.  Indications:     Coronary artery disease Sonographer:     Joseph [name] RDCS Performing Phys: [id number] Eliezer Lofts LIGHTFOOT Diagnosing Phys: Dorris Singh MD  POST-OP IMPRESSIONS - Left Ventricle: Post Bypass: The patient came off bypass on the initial attempt. There did not appear to be any new findings from preop exam. Left ventricular contraction did improve with volume replacement. The TEE that had been placed uneventfully after induction was removed without difficulty at the end of the case. The patient was later taken to the SICU in stable conditon.  PRE-OP FINDINGS Left Ventricle: The left ventricle has low normal systolic function, with an ejection fraction of 50-55%.  Right Ventricle: The right ventricle has normal systolic function.  Left Atrium: The left atrial appendage is well visualized and there is no evidence of thrombus present.   Mitral Valve: The mitral valve is normal in  structure. Mitral valve regurgitation is trivial by color flow Doppler.  Tricuspid Valve: The tricuspid valve was normal in structure.  Aortic Valve: The aortic valve is tricuspid Aortic valve regurgitation was not  visualized by color flow Doppler.  Pulmonic Valve: The pulmonic valve was normal in structure.    Aorta: The ascending aorta is normal in size and structure.   Dorris Singh MD Electronically signed by Dorris Singh MD Signature Date/Time: 07/24/2020/6:13:18 PM   Final            Assessment & Plan    1.  Bicuspid aortic valve: -Patient had diastolic murmur auscultated on examination today. -He reports that he has been experiencing increased fatigue and occasional bouts of chest discomfort. -Given his history of bicuspid aortic valve we will repeat 2D echo to evaluate for possible aortic regurgitation -We will check lab work and plan to start torsemide 20 mg due to patient's increased volume since previous visit   2.  Essential hypertension: -Patient's blood pressure today was well controlled at 96/66 -Continue current antihypertensive regimen as noted above   3.  HFpEF: -Patient's last 2D echo was completed with EF of 55-60, no RWMA, moderate LVH, GRII DD, normal RVSF, trivial MR, -Patient appears volume up on on exam today with no shortness of breath noted.  He was started on Lasix 80 mg for 3 weeks by his PCP -We will obtain BMET and BNP today with plan to switch him to torsemide 20 mg daily -Patient had right and left heart cath that showed normal RV pressures -Continue current GDMT with carvedilol 3.125 mg,   4.  Hyperlipidemia: -Patient's last LDL was 95 -Continue atorvastatin 80 mg and Vascepa 2 g twice daily   5.  Dilated ascending aorta: -2D echo completed 12/2021 with 43 mm noted, patient will need repeat echo or CT for further evaluation   6. Coronary artery disease: -s/p CABG in 2021 with most recent right and left heart catheterization completed with clear grafts and normal RV pressure. -Today patient report 2 episodes of chest pain with elevated blood pressure spikes that have required nitroglycerin for relief. Continue GDMT with ASA 81 mg, Lipitor 80 mg  daily, Plavix 75 mg daily, Ranexa 500 mg twice daily, carvedilol 3.125 mg twice daily     Disposition: Follow-up with Chilton Si, MD or APP in *** months {Are you ordering a CV Procedure (e.g. stress test, cath, DCCV, TEE, etc)?   Press F2        :[id number]}   Medication Adjustments/Labs and Tests Ordered: Current medicines are reviewed at length with the patient today.  Concerns regarding medicines are outlined above.   Signed, Napoleon Form, Leodis Rains, NP 04/03/2023, 12:01 PM [facility]  Note:  This document was prepared using Dragon voice recognition software and may include unintentional dictation errors.

## 2023-04-04 ENCOUNTER — Ambulatory Visit: Payer: Medicare HMO | Admitting: Nurse Practitioner

## 2023-04-04 DIAGNOSIS — I5032 Chronic diastolic (congestive) heart failure: Secondary | ICD-10-CM

## 2023-04-04 DIAGNOSIS — E1151 Type 2 diabetes mellitus with diabetic peripheral angiopathy without gangrene: Secondary | ICD-10-CM

## 2023-04-04 DIAGNOSIS — Q231 Congenital insufficiency of aortic valve: Secondary | ICD-10-CM

## 2023-04-04 DIAGNOSIS — I7121 Aneurysm of the ascending aorta, without rupture: Secondary | ICD-10-CM

## 2023-04-04 DIAGNOSIS — I10 Essential (primary) hypertension: Secondary | ICD-10-CM

## 2023-04-04 DIAGNOSIS — I25119 Atherosclerotic heart disease of native coronary artery with unspecified angina pectoris: Secondary | ICD-10-CM

## 2023-04-14 ENCOUNTER — Telehealth: Payer: Self-pay

## 2023-04-14 NOTE — Telephone Encounter (Signed)
   Name: Joseph [name]  DOB: February 13, 1971  MRN: [id number]  Primary Cardiologist: Chilton Si, MD  Chart reviewed as part of pre-operative protocol coverage. Because of Joseph [name]'s past medical history and time since last visit, he will require a follow-up in-office visit in order to better assess preoperative cardiovascular risk.  Pre-op covering staff: - Please schedule appointment and call patient to inform them. If patient already had an upcoming appointment within acceptable timeframe, please add "pre-op clearance" to the appointment notes so provider is aware. - Please contact requesting surgeon's office via preferred method (i.e, phone, fax) to inform them of need for appointment prior to surgery.  Per office protocol, if patient is without any new symptoms or concerns at the time of their visit, he may hold Plavix for 5 days prior to procedure. Please resume Plavix as soon as possible postprocedure, at the discretion of the surgeon.    Joylene Grapes, NP  04/14/2023, 8:50 AM

## 2023-04-14 NOTE — Telephone Encounter (Signed)
....     Pre-operative Risk Assessment    Patient Name: Joseph [name]  DOB: 09/04/1971 MRN: [id number]      Request for Surgical Clearance    Procedure:   RETINAL SURGERY  Date of Surgery:  Clearance 05/03/23                                 Surgeon:  DR ERIC [facility] Surgeon's Group or Practice Name:  [facility] Phone number:  [phone] Fax number:  [phone]   Type of Clearance Requested:   - Medical  - Pharmacy:  Hold Aspirin and Clopidogrel (Plavix) HOLD 5 DAYS PRIOR   Type of Anesthesia:  MAC   Additional requests/questions:    Jola Babinski   04/14/2023, 8:21 AM

## 2023-04-14 NOTE — Telephone Encounter (Signed)
I s/w the pt and we have scheduled 04/25/23 @ 1:55 with Tereso Newcomer, PAC for pre op clearance. Agreed to leave 05/2023 appt with Robin Searing, NP, pt will d/w [facility] on 5/6 appt if 05/2023 still needed. If not he will have his nurse cancel the appt. P thanked me for the help. I will update all parties involved.

## 2023-04-25 ENCOUNTER — Ambulatory Visit: Payer: Medicare HMO | Attending: Physician Assistant | Admitting: Physician Assistant

## 2023-04-25 ENCOUNTER — Encounter: Payer: Self-pay | Admitting: Physician Assistant

## 2023-04-25 VITALS — BP 124/76 | HR 80 | Ht 77.0 in | Wt 375.0 lb

## 2023-04-25 DIAGNOSIS — I5032 Chronic diastolic (congestive) heart failure: Secondary | ICD-10-CM | POA: Diagnosis not present

## 2023-04-25 DIAGNOSIS — I25119 Atherosclerotic heart disease of native coronary artery with unspecified angina pectoris: Secondary | ICD-10-CM

## 2023-04-25 DIAGNOSIS — I7121 Aneurysm of the ascending aorta, without rupture: Secondary | ICD-10-CM

## 2023-04-25 DIAGNOSIS — Z01810 Encounter for preprocedural cardiovascular examination: Secondary | ICD-10-CM | POA: Diagnosis not present

## 2023-04-25 DIAGNOSIS — I10 Essential (primary) hypertension: Secondary | ICD-10-CM

## 2023-04-25 DIAGNOSIS — Q231 Congenital insufficiency of aortic valve: Secondary | ICD-10-CM

## 2023-04-25 MED ORDER — FUROSEMIDE 40 MG PO TABS: ORAL_TABLET | ORAL | 0 refills | Status: DC

## 2023-04-25 NOTE — Assessment & Plan Note (Addendum)
He is fairly sedentary.  His diet also seems to be rich in salt.  He drinks electrolyte water which has sodium in it.  I have asked him to reduce/eliminate this.  He did not have any improvement with changing furosemide to torsemide.  Actually, he feels his volume has worsened.  Therefore, I will discontinue torsemide.  Resume furosemide 120 mg in the morning, 80 mg in the afternoon.  Obtain follow-up BMET in 1 week.  Obtain echocardiogram as previously recommended.  Of note, this does not need to be done prior to his eye surgery.  Follow-up in June as planned with Alden Server.

## 2023-04-25 NOTE — Assessment & Plan Note (Signed)
The patient needs to undergo eye surgery.  His wife tells me that this is to treat macular degeneration.  There is concerned that he will lose his eyesight.  He has a very complicated cardiac history.  The eye procedure is to be done under conscious sedation.  The patient is high risk for any procedure, regardless of his clinical condition.  I discussed his case today with Dr. Anne Fu (attending MD).  Given the low risk nature of his eye procedure, we agreed that the patient could proceed with his eye procedure at increased but acceptable risk.  From a cardiovascular standpoint, if it is needed, he can hold aspirin and clopidogrel (Plavix) for 5 days.  This should be resumed postoperatively as soon as it is felt to be safe.

## 2023-04-25 NOTE — Assessment & Plan Note (Signed)
He has an aortic stenosis murmur on exam.  He did not have significant aortic stenosis by echocardiogram in 2023.  Therefore, it is not likely that he has significant aortic stenosis at this time.  However, he does need a follow-up echocardiogram to document this.  We will try to reschedule this for him.  He notes transportation issues.  It has been difficult to get here for his echocardiogram.  He also is unable to see Dr. Duke Salvia at the [facility] location.  He requests transfer to [facility].  I have reviewed this with Dr. Anne Fu.  We will try to transfer him to [facility] to be followed by Dr. Anne Fu.

## 2023-04-25 NOTE — Assessment & Plan Note (Signed)
43 mm was on echocardiogram in 2023.  Repeat echocardiogram pending.

## 2023-04-25 NOTE — Patient Instructions (Addendum)
Medication Instructions:  Your physician has recommended you make the following change in your medication:   STOP Torsemide  START Lasix 40 mg taking 3 tablets in the am, 2 tablets in the pm  *If you need a refill on your cardiac medications before your next appointment, please call your pharmacy*   Lab Work: TODAY:  BMET & PRO BNP  1 WEEK:  BMET  05/03/23 ANYTIME AFTER 7:15 A.M. BEFORE 4:45 P.M  If you have labs (blood work) drawn today and your tests are completely normal, you will receive your results only by: MyChart Message (if you have MyChart) OR A paper copy in the mail If you have any lab test that is abnormal or we need to change your treatment, we will call you to review the results.   Testing/Procedures: Your physician has requested that you have an echocardiogram. Echocardiography is a painless test that uses sound waves to create images of your heart. It provides your doctor with information about the size and shape of your heart and how well your heart's chambers and valves are working. This procedure takes approximately one hour. There are no restrictions for this procedure. Please do NOT wear cologne, perfume, aftershave, or lotions (deodorant is allowed). Please arrive 15 minutes prior to your appointment time.    Follow-Up: At [facility], you and your health needs are our priority.  As part of our continuing mission to provide you with exceptional heart care, we have created designated Provider Care Teams.  These Care Teams include your primary Cardiologist (physician) and Advanced Practice Providers (APPs -  Physician Assistants and Nurse Practitioners) who all work together to provide you with the care you need, when you need it.  We recommend signing up for the patient portal called "MyChart".  Sign up information is provided on this After Visit Summary.  MyChart is used to connect with patients for Virtual Visits (Telemedicine).  Patients are able to view  lab/test results, encounter notes, upcoming appointments, etc.  Non-urgent messages can be sent to your provider as well.   To learn more about what you can do with MyChart, go to [email or web address].    Your next appointment:   As scheduled  Provider:   Robin Searing, NP         Other Instructions

## 2023-04-25 NOTE — Assessment & Plan Note (Signed)
Blood pressure is well-controlled.  Continue amlodipine 10 mg daily, Coreg 12.5 mg twice daily.

## 2023-04-25 NOTE — Assessment & Plan Note (Addendum)
History of prior PCI in 2017 and CABG in 2021.  Cardiac catheterization in 2022 and 2023 demonstrated patent bypass wraps.  He has chronic chest discomfort.  His anginal symptoms have worsened over the past several weeks.  I suspect that this is all related to volume excess.  As noted, I reviewed his case with Dr. Anne Fu.  We do not feel that he needs to undergo repeat cardiac catheterization given his prior results less than 1 year ago.  Given his size, a nuclear stress test will not likely provide adequate results.  He was unable to tolerate Imdur in the past.  Continue Ranexa 500 mg twice daily, amlodipine 10 mg daily, aspirin 81 mg daily, Lipitor 80 mg daily, carvedilol 12.5 mg twice daily, Plavix 75 mg daily.  Proceed with adjustments in furosemide as noted.  If his angina does not improve with diuresis, consider increasing dose of Ranexa.  Keep follow-up in June as planned.

## 2023-04-25 NOTE — Progress Notes (Signed)
Cardiology Office Note:    Date:  04/25/2023  ID:  Joseph [name], DOB Jul 04, 1971, MRN [id number] PCP: Marva Panda, NP  [facility] Providers Cardiologist:  Chilton Si, MD          Patient Profile:   Coronary artery disease  S/p PCI to LCx in 07/2016 S/p CABG in 2021 Cath 08/17/2021: L-LAD, S-D3, S-OM2, S-RPDA patent  LHC 06/17/2022: Distal left main 80, mid LAD 50, D1 40, D3 60 LCx 100, proximal RCA 80, mid stent patent with 30 ISR; LIMA-LAD, SVG-Dx SVG-OM, SVG-PDA patent Chronic chest pain  Peripheral arterial disease  S/p partial foot amputationd S/p stent to L popliteal and L SFA S/p L BKA Jan 2023 S/p R BKA July 2023 (HFpEF) heart failure with preserved ejection fraction  Ascending thoracic aortic aneurysm  TTE 01/07/2022: Bicuspid aortic valve, EF 55-60, no RWMA, moderate LVH, GRII DD, normal RVSF, trivial MR, no aortic stenosis, mild dilation of ascending aorta (43 mm), mild dilation of aortic root (42 mm)   Aortic atherosclerosis  Hypertension  Hyperlipidemia  Diabetes mellitus  Bicuspid aortic valve Hx of pulmonary embolism  Chronic Obstructive Pulmonary Disease  OSA GERD  R foot ulcer         History of Present Illness:   Joseph [name] is a 52 y.o. male who returns for surgical clearance.  He was last seen by Robin Searing, NP 01/13/2023.  He was changed from furosemide to torsemide for volume excess.  Follow-up 2D echocardiogram was ordered.  This has not yet been done.  He needs retinal surgery with Dr.  Irineo Axon at [facility] 05/03/2023 under conscious sedation.  Request has been to hold aspirin and Plavix for 5 days.  Of note, he has a follow-up with Alden Server in June 2024.  He is here with his wife today.  He arrives in a power chair.  He is fairly sedentary.  He has chronic chest discomfort.  He notes worsening chest discomfort over the past several weeks.  He tries not to take nitroglycerin for this.  His symptoms may last for  several hours.  Since his furosemide was changed to torsemide, his wife feels that his lower extremity edema is worse.  He does get short of breath with transferring.  He also has upper extremity edema, left greater than the right.  This also seems to be worse.  He sleeps on an incline.  He has not had syncope.  His blood pressure was significantly elevated in March.  His PCP increased his dose of carvedilol.  His breathing seemed to improve with this.  Review of Systems  Respiratory:  Positive for wheezing. Negative for cough.   Genitourinary:  Positive for hematuria.   see HPI    Studies Reviewed:    EKG: NSR, HR 80, inferior Q waves, anterolateral Q waves, QTc 412, first-degree AV block, PR 252, no significant change compared to prior tracing   Risk Assessment/Calculations:             Physical Exam:   VS:  BP 124/76   Pulse 80   Ht 6\' 5"  (1.956 m) Comment: per patient amputee  Wt (!) 375 lb (170.1 kg) Comment: weighed wheel chair and pt and deducted 336 lbs wheel chair  SpO2 93%   BMI 44.47 kg/m    Wt Readings from Last 3 Encounters:  04/25/23 (!) 375 lb (170.1 kg)  01/13/23 (!) 400 lb (181.4 kg)  10/08/22 (!) 360 lb (163.3 kg)  Constitutional:      Appearance: Not in distress. Chronically ill-appearing.  Neck:     Vascular: No JVR.  Pulmonary:     Breath sounds: No wheezing. Rales (coarse breath sounds at the bases - ?rales vs atelectasis) present.  Cardiovascular:     Normal rate. Regular rhythm.     Murmurs: There is a grade 2/6 harsh crescendo-decrescendo systolic murmur at the URSB.  Edema:    Peripheral edema present.    Thigh: bilateral 2+ edema of the thigh. Abdominal:     Palpations: Abdomen is soft.       ASSESSMENT AND PLAN:   Preoperative cardiovascular examination The patient needs to undergo eye surgery.  His wife tells me that this is to treat macular degeneration.  There is concerned that he will lose his eyesight.  He has a very complicated  cardiac history.  The eye procedure is to be done under conscious sedation.  The patient is high risk for any procedure, regardless of his clinical condition.  I discussed his case today with Dr. Anne Fu (attending MD).  Given the low risk nature of his eye procedure, we agreed that the patient could proceed with his eye procedure at increased but acceptable risk.  From a cardiovascular standpoint, if it is needed, he can hold aspirin and clopidogrel (Plavix) for 5 days.  This should be resumed postoperatively as soon as it is felt to be safe.  (HFpEF) heart failure with preserved ejection fraction (HCC) He is fairly sedentary.  His diet also seems to be rich in salt.  He drinks electrolyte water which has sodium in it.  I have asked him to reduce/eliminate this.  He did not have any improvement with changing furosemide to torsemide.  Actually, he feels his volume has worsened.  Therefore, I will discontinue torsemide.  Resume furosemide 120 mg in the morning, 80 mg in the afternoon.  Obtain follow-up BMET in 1 week.  Obtain echocardiogram as previously recommended.  Of note, this does not need to be done prior to his eye surgery.  Follow-up in June as planned with Alden Server.  CAD (coronary artery disease) History of prior PCI in 2017 and CABG in 2021.  Cardiac catheterization in 2022 and 2023 demonstrated patent bypass wraps.  He has chronic chest discomfort.  His anginal symptoms have worsened over the past several weeks.  I suspect that this is all related to volume excess.  As noted, I reviewed his case with Dr. Anne Fu.  We do not feel that he needs to undergo repeat cardiac catheterization given his prior results less than 1 year ago.  Given his size, a nuclear stress test will not likely provide adequate results.  He was unable to tolerate Imdur in the past.  Continue Ranexa 500 mg twice daily, amlodipine 10 mg daily, aspirin 81 mg daily, Lipitor 80 mg daily, carvedilol 12.5 mg twice daily, Plavix 75 mg  daily.  Proceed with adjustments in furosemide as noted.  If his angina does not improve with diuresis, consider increasing dose of Ranexa.  Keep follow-up in June as planned.  Aneurysm of ascending aorta (HCC) 43 mm was on echocardiogram in 2023.  Repeat echocardiogram pending.  Essential hypertension Blood pressure is well-controlled.  Continue amlodipine 10 mg daily, Coreg 12.5 mg twice daily.  Bicuspid aortic valve He has an aortic stenosis murmur on exam.  He did not have significant aortic stenosis by echocardiogram in 2023.  Therefore, it is not likely that he has significant aortic stenosis  at this time.  However, he does need a follow-up echocardiogram to document this.  We will try to reschedule this for him.  He notes transportation issues.  It has been difficult to get here for his echocardiogram.  He also is unable to see Dr. Duke Salvia at the [facility] location.  He requests transfer to [facility].  I have reviewed this with Dr. Anne Fu.  We will try to transfer him to [facility] to be followed by Dr. Anne Fu.      Dispo:  Return in about 4 weeks (around 05/23/2023) for Routine Follow Up with Robin Searing, NP.  Signed, Tereso Newcomer, PA-C

## 2023-04-26 ENCOUNTER — Emergency Department (HOSPITAL_COMMUNITY): Payer: Medicare HMO

## 2023-04-26 ENCOUNTER — Encounter (HOSPITAL_COMMUNITY): Payer: Self-pay | Admitting: Emergency Medicine

## 2023-04-26 ENCOUNTER — Other Ambulatory Visit: Payer: Self-pay

## 2023-04-26 ENCOUNTER — Telehealth (HOSPITAL_BASED_OUTPATIENT_CLINIC_OR_DEPARTMENT_OTHER): Payer: Self-pay | Admitting: Cardiovascular Disease

## 2023-04-26 ENCOUNTER — Inpatient Hospital Stay (HOSPITAL_COMMUNITY)
Admission: EM | Admit: 2023-04-26 | Discharge: 2023-05-03 | DRG: 291 | Disposition: A | Discharge status: Home / Self Care | Payer: Medicare HMO | Attending: Family Medicine | Admitting: Family Medicine

## 2023-04-26 DIAGNOSIS — Q231 Congenital insufficiency of aortic valve: Secondary | ICD-10-CM

## 2023-04-26 DIAGNOSIS — K219 Gastro-esophageal reflux disease without esophagitis: Secondary | ICD-10-CM | POA: Diagnosis present

## 2023-04-26 DIAGNOSIS — I25110 Atherosclerotic heart disease of native coronary artery with unstable angina pectoris: Secondary | ICD-10-CM | POA: Diagnosis present

## 2023-04-26 DIAGNOSIS — Z7984 Long term (current) use of oral hypoglycemic drugs: Secondary | ICD-10-CM

## 2023-04-26 DIAGNOSIS — I10 Essential (primary) hypertension: Secondary | ICD-10-CM | POA: Diagnosis present

## 2023-04-26 DIAGNOSIS — I200 Unstable angina: Secondary | ICD-10-CM | POA: Diagnosis present

## 2023-04-26 DIAGNOSIS — E1122 Type 2 diabetes mellitus with diabetic chronic kidney disease: Secondary | ICD-10-CM | POA: Diagnosis present

## 2023-04-26 DIAGNOSIS — G8929 Other chronic pain: Secondary | ICD-10-CM | POA: Diagnosis present

## 2023-04-26 DIAGNOSIS — M25511 Pain in right shoulder: Secondary | ICD-10-CM | POA: Diagnosis present

## 2023-04-26 DIAGNOSIS — N1832 Chronic kidney disease, stage 3b: Secondary | ICD-10-CM | POA: Diagnosis present

## 2023-04-26 DIAGNOSIS — Z91199 Patient's noncompliance with other medical treatment and regimen due to unspecified reason: Secondary | ICD-10-CM

## 2023-04-26 DIAGNOSIS — Z5986 Financial insecurity: Secondary | ICD-10-CM

## 2023-04-26 DIAGNOSIS — E1151 Type 2 diabetes mellitus with diabetic peripheral angiopathy without gangrene: Secondary | ICD-10-CM | POA: Diagnosis present

## 2023-04-26 DIAGNOSIS — Z79899 Other long term (current) drug therapy: Secondary | ICD-10-CM

## 2023-04-26 DIAGNOSIS — E1165 Type 2 diabetes mellitus with hyperglycemia: Secondary | ICD-10-CM | POA: Diagnosis present

## 2023-04-26 DIAGNOSIS — I5033 Acute on chronic diastolic (congestive) heart failure: Secondary | ICD-10-CM

## 2023-04-26 DIAGNOSIS — Z7902 Long term (current) use of antithrombotics/antiplatelets: Secondary | ICD-10-CM

## 2023-04-26 DIAGNOSIS — Z89512 Acquired absence of left leg below knee: Secondary | ICD-10-CM

## 2023-04-26 DIAGNOSIS — N049 Nephrotic syndrome with unspecified morphologic changes: Secondary | ICD-10-CM

## 2023-04-26 DIAGNOSIS — I959 Hypotension, unspecified: Secondary | ICD-10-CM | POA: Diagnosis present

## 2023-04-26 DIAGNOSIS — S88112A Complete traumatic amputation at level between knee and ankle, left lower leg, initial encounter: Secondary | ICD-10-CM | POA: Diagnosis present

## 2023-04-26 DIAGNOSIS — I509 Heart failure, unspecified: Secondary | ICD-10-CM

## 2023-04-26 DIAGNOSIS — Z8249 Family history of ischemic heart disease and other diseases of the circulatory system: Secondary | ICD-10-CM

## 2023-04-26 DIAGNOSIS — E875 Hyperkalemia: Secondary | ICD-10-CM | POA: Diagnosis present

## 2023-04-26 DIAGNOSIS — N179 Acute kidney failure, unspecified: Secondary | ICD-10-CM | POA: Diagnosis present

## 2023-04-26 DIAGNOSIS — I130 Hypertensive heart and chronic kidney disease with heart failure and stage 1 through stage 4 chronic kidney disease, or unspecified chronic kidney disease: Principal | ICD-10-CM | POA: Diagnosis present

## 2023-04-26 DIAGNOSIS — R531 Weakness: Principal | ICD-10-CM

## 2023-04-26 DIAGNOSIS — Z86718 Personal history of other venous thrombosis and embolism: Secondary | ICD-10-CM

## 2023-04-26 DIAGNOSIS — Z955 Presence of coronary angioplasty implant and graft: Secondary | ICD-10-CM

## 2023-04-26 DIAGNOSIS — E785 Hyperlipidemia, unspecified: Secondary | ICD-10-CM | POA: Diagnosis present

## 2023-04-26 DIAGNOSIS — I2510 Atherosclerotic heart disease of native coronary artery without angina pectoris: Secondary | ICD-10-CM | POA: Diagnosis present

## 2023-04-26 DIAGNOSIS — Z7982 Long term (current) use of aspirin: Secondary | ICD-10-CM

## 2023-04-26 DIAGNOSIS — I7121 Aneurysm of the ascending aorta, without rupture: Secondary | ICD-10-CM | POA: Diagnosis present

## 2023-04-26 DIAGNOSIS — I5030 Unspecified diastolic (congestive) heart failure: Secondary | ICD-10-CM | POA: Insufficient documentation

## 2023-04-26 DIAGNOSIS — Z882 Allergy status to sulfonamides status: Secondary | ICD-10-CM

## 2023-04-26 DIAGNOSIS — E1140 Type 2 diabetes mellitus with diabetic neuropathy, unspecified: Secondary | ICD-10-CM | POA: Diagnosis present

## 2023-04-26 DIAGNOSIS — Z5982 Transportation insecurity: Secondary | ICD-10-CM

## 2023-04-26 DIAGNOSIS — Z794 Long term (current) use of insulin: Secondary | ICD-10-CM

## 2023-04-26 DIAGNOSIS — Z89511 Acquired absence of right leg below knee: Secondary | ICD-10-CM

## 2023-04-26 DIAGNOSIS — D631 Anemia in chronic kidney disease: Secondary | ICD-10-CM | POA: Diagnosis present

## 2023-04-26 DIAGNOSIS — R079 Chest pain, unspecified: Secondary | ICD-10-CM | POA: Diagnosis present

## 2023-04-26 DIAGNOSIS — I252 Old myocardial infarction: Secondary | ICD-10-CM

## 2023-04-26 DIAGNOSIS — Z87891 Personal history of nicotine dependence: Secondary | ICD-10-CM

## 2023-04-26 DIAGNOSIS — Z86711 Personal history of pulmonary embolism: Secondary | ICD-10-CM

## 2023-04-26 DIAGNOSIS — Z560 Unemployment, unspecified: Secondary | ICD-10-CM

## 2023-04-26 DIAGNOSIS — F32A Depression, unspecified: Secondary | ICD-10-CM | POA: Diagnosis present

## 2023-04-26 DIAGNOSIS — G4733 Obstructive sleep apnea (adult) (pediatric): Secondary | ICD-10-CM | POA: Insufficient documentation

## 2023-04-26 DIAGNOSIS — Z83438 Family history of other disorder of lipoprotein metabolism and other lipidemia: Secondary | ICD-10-CM

## 2023-04-26 DIAGNOSIS — F419 Anxiety disorder, unspecified: Secondary | ICD-10-CM | POA: Diagnosis present

## 2023-04-26 DIAGNOSIS — Z6841 Body Mass Index (BMI) 40.0 and over, adult: Secondary | ICD-10-CM

## 2023-04-26 DIAGNOSIS — E119 Type 2 diabetes mellitus without complications: Secondary | ICD-10-CM

## 2023-04-26 DIAGNOSIS — Z951 Presence of aortocoronary bypass graft: Secondary | ICD-10-CM

## 2023-04-26 DIAGNOSIS — J4489 Other specified chronic obstructive pulmonary disease: Secondary | ICD-10-CM | POA: Diagnosis present

## 2023-04-26 DIAGNOSIS — Z833 Family history of diabetes mellitus: Secondary | ICD-10-CM

## 2023-04-26 LAB — BASIC METABOLIC PANEL
Anion gap: 11 (ref 5–15)
Anion gap: 11 (ref 5–15)
BUN/Creatinine Ratio: 22 — ABNORMAL HIGH (ref 9–20)
BUN: 40 mg/dL — ABNORMAL HIGH (ref 6–24)
BUN: 41 mg/dL — ABNORMAL HIGH (ref 6–20)
BUN: 39 mg/dL — ABNORMAL HIGH (ref 6–20)
CO2: 21 mmol/L (ref 20–29)
CO2: 24 mmol/L (ref 22–32)
CO2: 23 mmol/L (ref 22–32)
Calcium: 8.8 mg/dL (ref 8.7–10.2)
Calcium: 8.7 mg/dL — ABNORMAL LOW (ref 8.9–10.3)
Calcium: 8.6 mg/dL — ABNORMAL LOW (ref 8.9–10.3)
Chloride: 103 mmol/L (ref 96–106)
Chloride: 103 mmol/L (ref 98–111)
Chloride: 104 mmol/L (ref 98–111)
Creatinine, Ser: 1.84 mg/dL — ABNORMAL HIGH (ref 0.76–1.27)
Creatinine, Ser: 1.86 mg/dL — ABNORMAL HIGH (ref 0.61–1.24)
Creatinine, Ser: 1.71 mg/dL — ABNORMAL HIGH (ref 0.61–1.24)
GFR, Estimated: 44 mL/min — ABNORMAL LOW (ref >60)
GFR, Estimated: 48 mL/min — ABNORMAL LOW (ref >60)
Glucose, Bld: 171 mg/dL — ABNORMAL HIGH (ref 70–99)
Glucose, Bld: 245 mg/dL — ABNORMAL HIGH (ref 70–99)
Glucose: 252 mg/dL — ABNORMAL HIGH (ref 70–99)
Potassium: 5.9 mmol/L (ref 3.5–5.2)
Potassium: 4.9 mmol/L (ref 3.5–5.1)
Potassium: 5 mmol/L (ref 3.5–5.1)
Sodium: 138 mmol/L (ref 134–144)
Sodium: 138 mmol/L (ref 135–145)
Sodium: 138 mmol/L (ref 135–145)
eGFR: 44 mL/min/{1.73_m2} — ABNORMAL LOW (ref >59)

## 2023-04-26 LAB — HEMOGLOBIN A1C
Hgb A1c MFr Bld: 7.4 % — ABNORMAL HIGH (ref 4.8–5.6)
Mean Plasma Glucose: 165.68 mg/dL

## 2023-04-26 LAB — CBC
HCT: 40.9 % (ref 39.0–52.0)
Hemoglobin: 12.4 g/dL — ABNORMAL LOW (ref 13.0–17.0)
MCH: 24.2 pg — ABNORMAL LOW (ref 26.0–34.0)
MCHC: 30.3 g/dL (ref 30.0–36.0)
MCV: 79.9 fL — ABNORMAL LOW (ref 80.0–100.0)
Platelets: 261 10*3/uL (ref 150–400)
RBC: 5.12 MIL/uL (ref 4.22–5.81)
RDW: 18.6 % — ABNORMAL HIGH (ref 11.5–15.5)
WBC: 6.7 10*3/uL (ref 4.0–10.5)
nRBC: 0 % (ref 0.0–0.2)

## 2023-04-26 LAB — PROTEIN / CREATININE RATIO, URINE
Creatinine, Urine: 56 mg/dL
Protein Creatinine Ratio: 5.89 mg/mg{Cre} — ABNORMAL HIGH (ref 0.00–0.15)
Total Protein, Urine: 330 mg/dL

## 2023-04-26 LAB — BRAIN NATRIURETIC PEPTIDE: B Natriuretic Peptide: 137.3 pg/mL — ABNORMAL HIGH (ref 0.0–100.0)

## 2023-04-26 LAB — GLUCOSE, CAPILLARY
Glucose-Capillary: 166 mg/dL — ABNORMAL HIGH (ref 70–99)
Glucose-Capillary: 158 mg/dL — ABNORMAL HIGH (ref 70–99)
Glucose-Capillary: 218 mg/dL — ABNORMAL HIGH (ref 70–99)

## 2023-04-26 LAB — PRO B NATRIURETIC PEPTIDE: NT-Pro BNP: 375 pg/mL — ABNORMAL HIGH (ref 0–121)

## 2023-04-26 LAB — TROPONIN I (HIGH SENSITIVITY)
Troponin I (High Sensitivity): 10 ng/L (ref <18)
Troponin I (High Sensitivity): 9 ng/L (ref <18)

## 2023-04-26 MED ORDER — PREGABALIN 25 MG PO CAPS: 150.0000 mg | ORAL_CAPSULE | Freq: Two times a day (BID) | ORAL | Status: DC

## 2023-04-26 MED ORDER — RANOLAZINE ER 500 MG PO TB12
500.0000 mg | ORAL_TABLET | Freq: Two times a day (BID) | ORAL | Status: DC
  Administered 2023-04-26 – 2023-05-03 (×14): 500 mg via ORAL
  Filled 2023-04-26 (×16): qty 1

## 2023-04-26 MED ORDER — ALBUTEROL SULFATE (2.5 MG/3ML) 0.083% IN NEBU: 2.5000 mg | INHALATION_SOLUTION | Freq: Four times a day (QID) | RESPIRATORY_TRACT | Status: DC | PRN

## 2023-04-26 MED ORDER — ALPRAZOLAM 0.25 MG PO TABS
1.0000 mg | ORAL_TABLET | Freq: Every evening | ORAL | Status: DC | PRN
  Administered 2023-04-26 – 2023-04-27 (×2): 2 mg via ORAL
  Administered 2023-04-28: 1 mg via ORAL
  Administered 2023-05-01: 2 mg via ORAL
  Administered 2023-05-03: 1 mg via ORAL
  Filled 2023-04-26 (×3): qty 8
  Filled 2023-04-26 (×2): qty 4
  Filled 2023-04-26: qty 8

## 2023-04-26 MED ORDER — PREDNISOLONE ACETATE 1 % OP SUSP
1.0000 [drp] | Freq: Four times a day (QID) | OPHTHALMIC | Status: DC
  Administered 2023-04-27 – 2023-04-30 (×5): 1 [drp] via OPHTHALMIC
  Filled 2023-04-26: qty 5

## 2023-04-26 MED ORDER — ESCITALOPRAM OXALATE 10 MG PO TABS
10.0000 mg | ORAL_TABLET | Freq: Every morning | ORAL | Status: DC
  Administered 2023-04-27 – 2023-05-03 (×7): 10 mg via ORAL
  Filled 2023-04-26 (×7): qty 1

## 2023-04-26 MED ORDER — ASPIRIN 81 MG PO TBEC
81.0000 mg | DELAYED_RELEASE_TABLET | Freq: Every morning | ORAL | Status: DC
  Administered 2023-04-27 – 2023-05-03 (×7): 81 mg via ORAL
  Filled 2023-04-26 (×7): qty 1

## 2023-04-26 MED ORDER — PREGABALIN 75 MG PO CAPS
150.0000 mg | ORAL_CAPSULE | Freq: Every day | ORAL | Status: DC
  Administered 2023-04-26 – 2023-05-02 (×7): 150 mg via ORAL
  Filled 2023-04-26 (×7): qty 2

## 2023-04-26 MED ORDER — BUPROPION HCL ER (XL) 150 MG PO TB24
150.0000 mg | ORAL_TABLET | Freq: Every day | ORAL | Status: DC
  Administered 2023-04-26 – 2023-05-03 (×8): 150 mg via ORAL
  Filled 2023-04-26 (×8): qty 1

## 2023-04-26 MED ORDER — SODIUM CHLORIDE 0.9% FLUSH
3.0000 mL | Freq: Two times a day (BID) | INTRAVENOUS | Status: DC
  Administered 2023-04-26 – 2023-05-03 (×15): 3 mL via INTRAVENOUS

## 2023-04-26 MED ORDER — CLOPIDOGREL BISULFATE 75 MG PO TABS
75.0000 mg | ORAL_TABLET | Freq: Every day | ORAL | Status: DC
  Administered 2023-04-27 – 2023-05-03 (×7): 75 mg via ORAL
  Filled 2023-04-26 (×7): qty 1

## 2023-04-26 MED ORDER — SODIUM CHLORIDE 0.9% FLUSH
3.0000 mL | INTRAVENOUS | Status: DC | PRN
  Administered 2023-05-02: 3 mL via INTRAVENOUS

## 2023-04-26 MED ORDER — ICOSAPENT ETHYL 1 G PO CAPS
2.0000 g | ORAL_CAPSULE | Freq: Two times a day (BID) | ORAL | Status: DC
  Administered 2023-04-26 – 2023-05-03 (×14): 2 g via ORAL
  Filled 2023-04-26 (×14): qty 2

## 2023-04-26 MED ORDER — SODIUM CHLORIDE 0.9 % IV SOLN: 250.0000 mL | INTRAVENOUS | Status: DC | PRN

## 2023-04-26 MED ORDER — INSULIN GLARGINE-YFGN 100 UNIT/ML ~~LOC~~ SOLN
15.0000 [IU] | Freq: Two times a day (BID) | SUBCUTANEOUS | Status: DC
  Administered 2023-04-26 – 2023-04-27 (×3): 15 [IU] via SUBCUTANEOUS
  Filled 2023-04-26 (×5): qty 0.15

## 2023-04-26 MED ORDER — PANTOPRAZOLE SODIUM 40 MG PO TBEC
40.0000 mg | DELAYED_RELEASE_TABLET | Freq: Every day | ORAL | Status: DC
  Administered 2023-04-26 – 2023-05-01 (×6): 40 mg via ORAL
  Filled 2023-04-26 (×8): qty 1

## 2023-04-26 MED ORDER — ENOXAPARIN SODIUM 40 MG/0.4ML IJ SOSY
40.0000 mg | PREFILLED_SYRINGE | INTRAMUSCULAR | Status: DC
  Administered 2023-04-26: 40 mg via SUBCUTANEOUS
  Filled 2023-04-26: qty 0.4

## 2023-04-26 MED ORDER — FUROSEMIDE 10 MG/ML IJ SOLN: 60.0000 mg | Freq: Two times a day (BID) | INTRAMUSCULAR | Status: DC

## 2023-04-26 MED ORDER — INSULIN ASPART 100 UNIT/ML IJ SOLN
0.0000 [IU] | Freq: Three times a day (TID) | INTRAMUSCULAR | Status: DC
  Administered 2023-04-26: 3 [IU] via SUBCUTANEOUS
  Administered 2023-04-27: 5 [IU] via SUBCUTANEOUS
  Administered 2023-04-27: 3 [IU] via SUBCUTANEOUS
  Administered 2023-04-27: 5 [IU] via SUBCUTANEOUS
  Administered 2023-04-28: 8 [IU] via SUBCUTANEOUS
  Administered 2023-04-28 – 2023-04-29 (×3): 5 [IU] via SUBCUTANEOUS
  Administered 2023-04-29: 3 [IU] via SUBCUTANEOUS
  Administered 2023-04-29: 5 [IU] via SUBCUTANEOUS
  Administered 2023-04-30: 3 [IU] via SUBCUTANEOUS
  Administered 2023-04-30: 8 [IU] via SUBCUTANEOUS
  Administered 2023-04-30 – 2023-05-01 (×2): 5 [IU] via SUBCUTANEOUS
  Administered 2023-05-01: 8 [IU] via SUBCUTANEOUS
  Administered 2023-05-01: 5 [IU] via SUBCUTANEOUS
  Administered 2023-05-02: 3 [IU] via SUBCUTANEOUS
  Administered 2023-05-02: 2 [IU] via SUBCUTANEOUS
  Administered 2023-05-02: 3 [IU] via SUBCUTANEOUS
  Administered 2023-05-03: 5 [IU] via SUBCUTANEOUS
  Administered 2023-05-03: 3 [IU] via SUBCUTANEOUS

## 2023-04-26 MED ORDER — ACETAMINOPHEN 325 MG PO TABS
650.0000 mg | ORAL_TABLET | ORAL | Status: DC | PRN
  Administered 2023-04-29: 650 mg via ORAL
  Filled 2023-04-26: qty 2

## 2023-04-26 MED ORDER — AMLODIPINE BESYLATE 5 MG PO TABS
10.0000 mg | ORAL_TABLET | Freq: Every day | ORAL | Status: DC
  Administered 2023-04-26 – 2023-04-29 (×4): 10 mg via ORAL
  Filled 2023-04-26 (×4): qty 2

## 2023-04-26 MED ORDER — CARVEDILOL 12.5 MG PO TABS
12.5000 mg | ORAL_TABLET | Freq: Two times a day (BID) | ORAL | Status: DC
  Administered 2023-04-26 – 2023-04-30 (×9): 12.5 mg via ORAL
  Filled 2023-04-26 (×11): qty 1

## 2023-04-26 MED ORDER — FUROSEMIDE 10 MG/ML IJ SOLN
80.0000 mg | Freq: Two times a day (BID) | INTRAMUSCULAR | Status: DC
  Administered 2023-04-26 – 2023-04-28 (×4): 80 mg via INTRAVENOUS
  Filled 2023-04-26 (×4): qty 8

## 2023-04-26 MED ORDER — OXYCODONE HCL 5 MG PO TABS
15.0000 mg | ORAL_TABLET | ORAL | Status: DC | PRN
  Administered 2023-04-26 – 2023-05-03 (×27): 15 mg via ORAL
  Filled 2023-04-26 (×27): qty 3

## 2023-04-26 MED ORDER — ALBUTEROL SULFATE HFA 108 (90 BASE) MCG/ACT IN AERS: 2.0000 | INHALATION_SPRAY | Freq: Four times a day (QID) | RESPIRATORY_TRACT | Status: DC | PRN

## 2023-04-26 MED ORDER — PREGABALIN 100 MG PO CAPS
300.0000 mg | ORAL_CAPSULE | Freq: Every day | ORAL | Status: DC
  Administered 2023-04-27 – 2023-05-03 (×7): 300 mg via ORAL
  Filled 2023-04-26 (×7): qty 3

## 2023-04-26 MED ORDER — ATORVASTATIN CALCIUM 40 MG PO TABS
80.0000 mg | ORAL_TABLET | Freq: Every day | ORAL | Status: DC
  Administered 2023-04-26 – 2023-05-02 (×7): 80 mg via ORAL
  Filled 2023-04-26 (×7): qty 2

## 2023-04-26 MED ORDER — SODIUM ZIRCONIUM CYCLOSILICATE 10 G PO PACK: 10.0000 g | PACK | Freq: Once | ORAL | Status: DC

## 2023-04-26 NOTE — Telephone Encounter (Signed)
Consulted with Dr. Cristal Deer, patient needs to report to ED for [facility] recheck and EKG. About to call patient when lab report came across, with corrected value of 5.9 dated 5/6. Reviewed chart with Dr. Cristal Deer and discovered patient in hospital and had repeat BMP today with level of 4.9. No need to call patient as he is already admitted.

## 2023-04-26 NOTE — Progress Notes (Signed)
PT Cancellation Note  Patient Details Name: Joseph [name] MRN: [id number] DOB: May 30, 1971   Cancelled Treatment:    Reason Eval/Treat Not Completed: Pain limiting ability to participate. Pt reports shoulder pain, declines evaluation at this time. PT will follow up tomorrow.   Arlyss Gandy 04/26/2023, 5:26 PM

## 2023-04-26 NOTE — Assessment & Plan Note (Addendum)
Stable. Wound care per nursing.

## 2023-04-26 NOTE — Telephone Encounter (Signed)
Reviewed, patient currently in ER, recheck K 4.9. Defer to inpatient team

## 2023-04-26 NOTE — Assessment & Plan Note (Signed)
Non-compliant on CPAP at home. Willing to try it inpatient. -CPAP nightly

## 2023-04-26 NOTE — H&P (Signed)
   FMTS Attending Admission Note: Joseph Starr, MD   For questions about this patient, please use [email or web address] to page the family medicine resident on call. Pager number [phone].    I  have seen and examined this patient, reviewed their chart. I have discussed this patient with the resident. Will sign resident note as able.   52 yo with history of CAD s/p multiple stents and CABG, HF with EF of 55%, PE, bilateral AKA, morbid obesity, type 2 DM on insulin, CKDIIIA and bicuspid aortic valve presenting with acute exacerbation of heart failure with preserved EF. Causes include dietary indiscretion, worsening of valvular disease, medication non-adherence. Could also consider worsening underlying CAD as cause.  Reviewed labs, CXR, EKG. Reviewed Cardiology notes and prior hospital stay notes.  Exam noted for marked diffuse edema of upper and lower extremities. Systolic murmur 3/6 at RUSB.  Acute exacerbation of Heart failure, preserved EF. Seems to have developed slow volume overload. Unclear cause--perhaps listed causes above or needs diuretic adjustment. Will IV diurese. Repeat echocardiogram.  Monitor for ischemic symptoms. Reports he has daily chronic chest pain at times--possibly related to volume status. Monitor closely. Given severity of edema, it almost appears to be a nephrotic picture. Will obtain urine protein/creatinine to rule out nephrosis as cause of his quite severe edema. History of PE- no longer on Xeralto (last documented 2017)--will need to clarify if off this for life.  Acute kidney injury, has CKD IIIA. Suspect due to worsening heart failure. Diurese, monitor closely.  Normocytic anemia, likely anemia of chronic disease. Monitor, iron studies (he may benefit from IV iron while admitted).  Type 2 DM--will start home insulin and titrate as appropriate to CBG 140-180, has hyperglycemia on labs.  Will sign resident note as able.

## 2023-04-26 NOTE — Telephone Encounter (Signed)
Calling with critical labs results. Please advise  

## 2023-04-26 NOTE — ED Notes (Signed)
ED TO INPATIENT HANDOFF REPORT  ED Nurse Name and Phone #: Tori [phone]  S Name/Age/Gender Joseph [name] 52 y.o. male Room/Bed: 015C/015C  Code Status   Code Status: Prior  Home/SNF/Other Home Patient oriented to: self, place, time, and situation Is this baseline? Yes   Triage Complete: Triage complete  Chief Complaint AKI (acute kidney injury) (HCC) [N17.9]  Triage Note Pt BIB [facility] with reports of abnormal lab values. Pt unsure of what values were abnormal. Pt has not complaints at this time.    Allergies Allergies  Allergen Reactions   Sulfa Antibiotics Anaphylaxis and Other (See Comments)    Headaches     Level of Care/Admitting Diagnosis ED Disposition     ED Disposition  Admit   Condition  --   Comment  Hospital Area: [facility] [[id number]]  Level of Care: Telemetry Medical [[id number]]  May place patient in observation at [facility] or [facility] if equivalent level of care is available:: No  Covid Evaluation: Asymptomatic - no recent exposure (last 10 days) testing not required  Diagnosis: AKI (acute kidney injury) [facility]) [[id number]]  Admitting Physician: Westley Chandler [[id number]]  Attending Physician: Westley Chandler [[id number]]          B Medical/Surgery History Past Medical History:  Diagnosis Date   Aneurysm of ascending aorta (HCC) 09/14/2021   Anxiety    Arthritis    "knees, shoulders, hips, ankles" (07/29/2016)   Asthma    Bicuspid aortic valve 09/14/2021   CAD (coronary artery disease)    a. 2017: s/p BMS to distal Cx; b. LHC 05/30/2019: 80% mid, distal RCA s/p DES, 30% narrowing of d LM, widely patent LAD w/ luminal irregularities, widely patent stent in dCX  w/ 90+% stenosis distal to stent beofre small trifurcating obtuse marginal (potentially area of restenosis)   Chronic bronchitis (HCC)    Chronic ulcer of great toe of right foot (HCC) 09/09/2017   Chronic ulcer of right great toe (HCC) 09/19/2017   COPD (chronic obstructive  pulmonary disease) (HCC)    Coronary arteriosclerosis    Depression    Diabetes mellitus (HCC)    DVT (deep venous thrombosis) (HCC)    GERD (gastroesophageal reflux disease)    Hyperlipidemia    Hypertension    Migraine    Morbid obesity (HCC)    Myocardial infarction (HCC) 1996   "light one"   PE (pulmonary embolism) 04/2013   On chronic Xarelto   Peripheral nerve disease    Pneumonia "several times"   Sleep apnea    Type II diabetes mellitus (HCC)    Past Surgical History:  Procedure Laterality Date   ABDOMINAL AORTOGRAM W/LOWER EXTREMITY N/A 10/10/2020   Procedure: ABDOMINAL AORTOGRAM W/LOWER EXTREMITY;  Surgeon: Sherren Kerns, MD;  [facility] INVASIVE CV LAB;  Service: Cardiovascular;  Laterality: N/A;   AMPUTATION Right 09/21/2017   Procedure: RIGHT GREAT TOE AMPUTATION, POSSIBLE VAC;  Surgeon: Tarry Kos, MD;  Location: [facility] OR;  Service: Orthopedics;  Laterality: Right;   AMPUTATION Left 08/13/2020   Procedure: LEFT FOOT 4TH RAY AMPUTATION;  Surgeon: [name], MD;  Location: [facility] OR;  Service: Orthopedics;  Laterality: Left;   AMPUTATION Left 11/20/2021   Procedure: LEFT TRANSMETATARSAL AMPUTATION;  Surgeon: [name], MD;  Location: [facility] OR;  Service: Orthopedics;  Laterality: Left;   AMPUTATION Left 01/08/2022   Procedure: LEFT BELOW KNEE AMPUTATION;  Surgeon: [name], MD;  Location: [facility] OR;  Service: Orthopedics;  Laterality: Left;   AMPUTATION Right 07/07/2022   Procedure: RIGHT BELOW KNEE AMPUTATION;  Surgeon: [name], MD;  Location: [facility] OR;  Service: Orthopedics;  Laterality: Right;   AORTOGRAM Bilateral 03/13/2021   Procedure: ABDOMINAL AORTOGRAM WITH Left LOWER EXTREMITY RUNOFF;  Surgeon: Leonie Douglas, MD;  Location: [facility] OR;  Service: Vascular;  Laterality: Bilateral;   APPLICATION OF WOUND VAC  01/08/2022   Procedure: APPLICATION OF WOUND VAC;  Surgeon: [name], MD;  Location: [facility] OR;  Service: Orthopedics;;   APPLICATION OF WOUND VAC  Right 07/07/2022   Procedure: APPLICATION OF WOUND VAC;  Surgeon: [name], MD;  [facility] OR;  Service: Orthopedics;  Laterality: Right;   CARDIAC CATHETERIZATION  2006   CARDIAC CATHETERIZATION  1996   "@ [facility]; when I had my heart attack"   CARDIAC CATHETERIZATION N/A 07/29/2016   Procedure: Left Heart Cath and Coronary Angiography;  Surgeon: Runell Gess, MD;  Location: [facility] INVASIVE CV LAB;  Service: Cardiovascular;  Laterality: N/A;   CARDIAC CATHETERIZATION N/A 07/29/2016   Procedure: Coronary Stent Intervention;  Surgeon: Runell Gess, MD;  [facility];  Service: Cardiovascular;  Laterality: N/A;   CARPAL TUNNEL RELEASE Bilateral    CORONARY ANGIOPLASTY WITH STENT PLACEMENT  07/29/2016   CORONARY ARTERY BYPASS GRAFT N/A 07/24/2020   Procedure: CORONARY ARTERY BYPASS GRAFTING (CABG), ON PUMP, TIMES FOUR, USING LEFT INTERNAL MAMMARY ARTERY AND ENDOSCOPICALLY HARVESTED RIGHT GREATER SAPHENOUS VEIN;  Surgeon: Corliss Skains, MD;  Location: [facility] OR;  Service: Open Heart Surgery;  Laterality: N/A;  FLOW TAC   CORONARY PRESSURE/FFR STUDY N/A 07/22/2020   Procedure: INTRAVASCULAR PRESSURE WIRE/FFR STUDY;  Surgeon: Marykay Lex, MD;  Location: [facility] INVASIVE CV LAB;  Service: Cardiovascular;  Laterality: N/A;   CORONARY STENT INTERVENTION N/A 05/30/2019   Procedure: CORONARY STENT INTERVENTION;  Surgeon: Lyn Records, MD;  [facility];  Service: Cardiovascular;  Laterality: N/A;   CORONARY STENT INTERVENTION N/A 11/02/2019   Procedure: CORONARY STENT INTERVENTION;  Surgeon: Iran Ouch, MD;  [facility];  Service: Cardiovascular;  Laterality: N/A;   ESOPHAGOGASTRODUODENOSCOPY N/A 09/22/2017   Procedure: ESOPHAGOGASTRODUODENOSCOPY (EGD);  Surgeon: Meryl Dare, MD;  Location: [facility] ENDOSCOPY;  Service: Endoscopy;  Laterality: N/A;   KNEE ARTHROSCOPY Bilateral    "2 on left; 1 on the right"   LEFT HEART CATH AND CORONARY  ANGIOGRAPHY N/A 11/02/2019   Procedure: LEFT HEART CATH AND CORONARY ANGIOGRAPHY;  Surgeon: Iran Ouch, MD;  [facility];  Service: Cardiovascular;  Laterality: N/A;   LEFT HEART CATH AND CORONARY ANGIOGRAPHY N/A 07/22/2020   Procedure: LEFT HEART CATH AND CORONARY ANGIOGRAPHY;  Surgeon: Marykay Lex, MD;  Location: [facility] INVASIVE CV LAB;  Service: Cardiovascular;  Laterality: N/A;   LEFT HEART CATH AND CORS/GRAFTS ANGIOGRAPHY N/A 08/17/2021   Procedure: LEFT HEART CATH AND CORS/GRAFTS ANGIOGRAPHY;  Surgeon: Yvonne Kendall, MD;  [facility];  Service: Cardiovascular;  Laterality: N/A;   PERIPHERAL VASCULAR INTERVENTION Left 10/11/2020   popliteal and SFA stent placement    PERIPHERAL VASCULAR INTERVENTION Left 10/10/2020   Procedure: PERIPHERAL VASCULAR INTERVENTION;  Surgeon: Sherren Kerns, MD;  [facility];  Service: Cardiovascular;  Laterality: Left;   RIGHT/LEFT HEART CATH AND CORONARY ANGIOGRAPHY N/A 05/30/2019   Procedure: RIGHT/LEFT HEART CATH AND CORONARY ANGIOGRAPHY;  Surgeon: Lyn Records, MD;  [facility];  Service:  Cardiovascular;  Laterality: N/A;   RIGHT/LEFT HEART CATH AND CORONARY/GRAFT ANGIOGRAPHY N/A 06/17/2022   Procedure: RIGHT/LEFT HEART CATH AND CORONARY/GRAFT ANGIOGRAPHY;  Surgeon: Lennette Bihari, MD;  [facility];  Service: Cardiovascular;  Laterality: N/A;   SHOULDER OPEN ROTATOR CUFF REPAIR Bilateral    TEE WITHOUT CARDIOVERSION N/A 07/24/2020   Procedure: TRANSESOPHAGEAL ECHOCARDIOGRAM (TEE);  Surgeon: Corliss Skains, MD;  Location: [facility] OR;  Service: Open Heart Surgery;  Laterality: N/A;     A IV Location/Drains/Wounds Patient Lines/Drains/Airways Status     Active Line/Drains/Airways     Name Placement date Placement time Site Days   Peripheral IV 04/26/23 20 G Left Antecubital 04/26/23  1213  Antecubital  less than 1   Negative Pressure Wound Therapy Leg Right  07/07/22  1609  --  293            Intake/Output Last 24 hours No intake or output data in the 24 hours ending 04/26/23 1433  Labs/Imaging Results for orders placed or performed during the hospital encounter of 04/26/23 (from the past 48 hour(s))  Basic metabolic panel     Status: Abnormal   Collection Time: 04/26/23 12:12 PM  Result Value Ref Range   Sodium 138 135 - 145 mmol/L   Potassium 4.9 3.5 - 5.1 mmol/L   Chloride 103 98 - 111 mmol/L   CO2 24 22 - 32 mmol/L   Glucose, Bld 171 (H) 70 - 99 mg/dL    Comment: Glucose reference range applies only to samples taken after fasting for at least 8 hours.   BUN 41 (H) 6 - 20 mg/dL   Creatinine, Ser 9.56 (H) 0.61 - 1.24 mg/dL   Calcium 8.7 (L) 8.9 - 10.3 mg/dL   GFR, Estimated 44 (L) >60 mL/min    Comment: (NOTE) Calculated using the CKD-EPI Creatinine Equation (2021)    Anion gap 11 5 - 15    Comment: Performed at [facility], [address] [place], [name] [address]  CBC     Status: Abnormal   Collection Time: 04/26/23 12:12 PM  Result Value Ref Range   WBC 6.7 4.0 - 10.5 K/uL   RBC 5.12 4.22 - 5.81 MIL/uL   Hemoglobin 12.4 (L) 13.0 - 17.0 g/dL   HCT 43.3 29.5 - 18.8 %   MCV 79.9 (L) 80.0 - 100.0 fL   MCH 24.2 (L) 26.0 - 34.0 pg   MCHC 30.3 30.0 - 36.0 g/dL   RDW 41.6 (H) 60.6 - 30.1 %   Platelets 261 150 - 400 K/uL   nRBC 0.0 0.0 - 0.2 %    Comment: Performed at [facility], [address], [place], [name] [address]  Troponin I (High Sensitivity)     Status: None   Collection Time: 04/26/23 12:12 PM  Result Value Ref Range   Troponin I (High Sensitivity) 10 <18 ng/L    Comment: (NOTE) Elevated high sensitivity troponin I (hsTnI) values and significant  changes across serial measurements may suggest ACS but many other  chronic and acute conditions are known to elevate hsTnI results.  Refer to the "Links" section for chest pain algorithms and additional  guidance. Performed at [facility]  Lab, [address] [place], [name] [address]   Brain natriuretic peptide     Status: Abnormal   Collection Time: 04/26/23 12:14 PM  Result Value Ref Range   B Natriuretic Peptide 137.3 (H) 0.0 - 100.0 pg/mL    Comment:  Performed at [facility] Lab, [address], [place], [name] [address]   DG Chest 2 View  Result Date: 04/26/2023 CLINICAL DATA:  Shortness of breath and chest pain EXAM: CHEST - 2 VIEW COMPARISON:  06/30/2022 FINDINGS: The cardiac silhouette, mediastinal and hilar contours are within normal limits and stable. Stable surgical changes coronary artery bypass surgery. The lungs are clear of an acute process. No infiltrates, edema effusions. The bony thorax is intact. IMPRESSION: No acute cardiopulmonary findings. Electronically Signed   By: Rudie Meyer M.D.   On: 04/26/2023 13:08    Pending Labs Unresulted Labs (From admission, onward)    None       Vitals/Pain Today's Vitals   04/26/23 1204 04/26/23 1213 04/26/23 1215  BP: 105/86  105/85  Pulse: 89  85  Resp: (!) 22  18  Temp:  98.5 F (36.9 C) 97.6 F (36.4 C)  TempSrc:  Oral Oral  SpO2: 97%  98%    Isolation Precautions No active isolations  Medications Medications - No data to display  Mobility power wheelchair     Focused Assessments Renal Assessment Handoff:  Hemodialysis Schedule: No dialysis  Last Hemodialysis date and time:    Restricted appendage:    R Recommendations: See Admitting Provider Note  Report given to:   Additional Notes: Bilateral BKA, axox4. VSS. Family at bedside

## 2023-04-26 NOTE — Assessment & Plan Note (Addendum)
Well controlled in 100s. -Continue Semglee 35u BID -Add Novolog 3U TID (home dosing) -mSSI with CBG checks

## 2023-04-26 NOTE — Progress Notes (Addendum)
FMTS Brief Progress Note  S: In to see patient with Dr. Sherrilee Gilles. He was on the phone with his wife, Herbert Seta. He was complaining of back and shoulder pain which appears chronic (takes oxy and lyrica). States that his medication hasn't had time to take effect yet.   He has been urinating and has already filled up a [employer] urinal.    O: BP (!) 136/90 (BP Location: Left Arm)   Pulse 87   Temp (!) 97.5 F (36.4 C) (Oral)   Resp 17   Wt (!) 176.1 kg   SpO2 95%   BMI 46.04 kg/m   Gen: obese male sitting in bed, NAD, conversational, pleasant Card: RRR Resp: rales to right base, on RA, normal respiratory effort Ext: s/p bilateral BKA   A/P: Acute on chronic CHF  S/p 80 mg Lasix. Diuresing well.  -Continue plan per day team   Elevated UPCR UPC is in nephrotic range at 5.89. Would benefit from ACE-I/ARB/SGLT-2 therapy for proteinuria.   Hx remote PE  Per note from Dr. Iver Nestle on 04/13/2022 it appears that his Xarelto was stopped after his CABG, possibly due to him being on DAPT therapy.  Hyperkalemia Initially 5.9 in ED. Given a dose of Lokelma and lowered to 4.9. Repeat evening BMP is stable at 5.   - Orders reviewed. Labs for AM ordered, which was adjusted as needed.    Sabino Dick, DO 04/26/2023, 10:07 PM PGY-3, [facility] Night Resident  Please page [phone] with questions.

## 2023-04-26 NOTE — Assessment & Plan Note (Deleted)
H/o CKD 3a at baseline (Cr 1.3-1.4), elevated to Cr 1.86 upon admission. Likely in the setting of HF exacerbation. -Avoid nephrotoxic agents -Monitor Cr closely

## 2023-04-26 NOTE — Telephone Encounter (Signed)
[facility] calling with critical results,   K+ 7.9  Results being faxed, will route and review with DOD in office

## 2023-04-26 NOTE — H&P (Cosign Needed Addendum)
Hospital Admission History and Physical Service Pager: [phone]  Patient name: Genesis Pirl Dobies Medical record number: [id number] Date of Birth: [birth year] Age: 52 y.o. Gender: male  Primary Care Provider: Marva Panda, NP Consultants: Cardiology Code Status: FULL  Preferred Emergency Contact:  Contact Information     Name Relation Home Work Mobile   Alberta,[name] Spouse [phone]  [phone]   Younglove,Jo Mother   [phone]   Crisco,Maydee Niece   [phone]        Chief Complaint: Hypervolemia  Assessment and Plan: Masaji Valerio Torbert is a 52 y.o. male presenting with hypervolemia in the setting of HF exacerbation with concurrent AKI on CKD. Differential diagnosis includes nephrotic syndrome, cirrhosis and substance or medication induced.  PMHx includes T2DM, bilateral BKA d/t diabetic foot, HLD, CAD s/p CABG, PE, CHF, PAD, aortic aneurysm, COPD, anxiety, GERD, HTN, and OSA.  * Acute exacerbation of CHF (congestive heart failure) (HCC) Preserved EF, Diastolic Seen in Cardiology office 5/6 for surgical clearance, found to be hypervolemic and mildly worsening renal function. Recommended admission for diuresis with close monitoring given compromised kidney function. Reports he was switched from Torsemide 20mg  daily which was not working to [employer] 120mg  AM and 80mg  PM yesterday that has been effective for diuresis. BNP elevated. CXR negative for pulmonary edema. Patient reports he feels like "marshmellow man" with significant all over edema and endorses orthopnea. Denies worsening SOB or chest pain. -Admitted to FMTS med tele, Dr. Manson Passey attending -Diuresis with IV Lasix 80mg  BID -Echo -PC ratio for evaluation of nephrotic syndrome -BMP BID -Cardiac monitoring -Continue home Coreg 12.5mg  BID  Essential hypertension BP well controlled upon admission. Reports at home it is high, up to 190s systolic.  -Restart home Amlodipine  History of pulmonary embolus (PE) Not  on full dose anticoagulation. Unclear why, no indication in chart review. Will discuss with patient further  OSA (obstructive sleep apnea) Non-compliant on CPAP at home. Willing to try it inpatient. -CPAP nightly  AKI (acute kidney injury) (HCC) H/o CKD 3a at baseline (Cr 1.3-1.4), elevated to Cr 1.86 upon admission. Likely in the setting of HF exacerbation. -Avoid nephrotoxic agents -Monitor Cr closely  Below-knee amputation of left lower extremity (HCC) Mostly uses wheelchair at baseline. Has prosthetics but unable to use due to ulceration on L BKA stump. Upon exam, superficial oblong ulceration without purulence or surrounding erythema. -Wound care consult  Hyperkalemia K 5.9 in ED, resolved to 4.9 without intervention. -PM BMP  Unstable angina (HCC) H/o chronic unstable angina, reports intermittent chest pain. States it may be related to hypertension but states he takes Nitro at home with relief. Currently asymptomatic and has not taken Nitro today. Troponin negative. -Monitor for worsening chest pain -Continue home Ranolazine 150mg  BID  Insulin dependent type 2 diabetes mellitus (HCC) A1c 7.2 in 06/2022. Takes Lantus 30U BID and Novolog 3-12U TID at home.  -Semglee 15U BID -mSSI with CBG checks -F/u A1c   Chronic stable conditions: Anxiety: continue Xanax 2mg  daily, Bupriprion 150mg  daily, Escitalopram 10mg  daily HLD: continue Atorvastatin 80mg  daily PAD: continue ASA and Plavix GERD: continue pantoprazole 40mg   Neuropathy: continue Pregabalin 150mg  TID  FEN/GI: Heart healthy VTE Prophylaxis: Lovenox  Disposition: Med tele  History of Present Illness:  Aksh Derda Popowski is a 52 y.o. male presenting with concern of increased fluid volume stating he feels like a "marshmallow man". Unclear duration, states he does not notice those things. Also experiencing dyspnea with movement and orthopnea. Saw cardiology outpatient yesterday for surgical  clearance and they recommended  diuresis with close evaluation due to elevated creatinine. Also states his torsemide 20 mg was changed to Lasix 120mg  AM and 80mg  PM.  Patient reports intermittent chest pain that can be associated with elevated BP. Takes does take Nitro with relief. Does not currently have chest pain.  Mostly non-ambulatory using wheelchair, has prosthesis but unable to use due to ulcer on L stump site.  In the ED, received EKG, CBC, trop, BNP (elevated), CXR, BMP with elevated K to 5.9 as well as Cr 1.84 (h/o CKD).   Review Of Systems: Per HPI above  Pertinent Past Medical History: T2DM, bilateral BKA d/t diabetic foot, HLD, CAD s/p CABG, PE, CHF, PAD, aortic aneurysm, COPD, anxiety, GERD, HTN, and OSA Remainder reviewed in history tab.   Pertinent Past Surgical History: Bilateral BKA CABG Bilateral rotator cuff repair Remainder reviewed in history tab.   Pertinent Social History: Tobacco use: Former, 34 pack year history Alcohol use: Rarely Other Substance use: None Lives with wife, aunt, uncle  Pertinent Family History: Father: HTN Brother: HTN Remainder reviewed in history tab.   Important Outpatient Medications: Albuterol Xanax 2mg  qhs --- PRN during day  Amlodipine 10mg  daily ASA 81mg  daily Lipitor 80mg  daily (previously had Crestor documented but denies  Wellbutrin 150mg  BID -- anxiety and depression  Coreg 12.5 or 3.125mg  BID Plavix 75mg  daily  Lexapro 10mg  daily Fluconazole 100mg  weekly Lasix 120mg  AM and 80mg  PM -- just started 04/25/23 Novolog 3U TID -- (3-12 U with meals) Lantus 30 U BID  Metformin 1000mg  BID Oxycodone 15mg   q4h -- chronic pain//shoulders, back, and hips Protonix 40mg  daily Pregabalin 150mg  BID (300 AM + 150 PM) Ranolazine 500mg  BID  Ubrelvy 100mg  prn Phenergan as needed  Diflucan weekly-- for yeast infection ppx, taken Monday 04/25/23 Nitroglycerin  Remainder reviewed in medication history.   Objective: BP (!) 121/101 (BP Location: Right Arm)    Pulse 85   Temp (!) 97.4 F (36.3 C) (Oral)   Resp 19   Wt (!) 176.1 kg   SpO2 97%   BMI 46.04 kg/m  Exam: General: Older than stated age obese male sitting up in bed, alert, NAD Eyes: PERRLA, anicteric sclera ENTM: Moist mucus membranes. Neck: Supple, non-tender Cardiovascular: RRR without murmur Respiratory: CTAB. Normal WOB on RA. Gastrointestinal: Soft, non-distended, mildly TTP over RLQ MSK: Bilateral BKA. Bilaterally non-pitting peripheral edema. Bilateral upper extremities edema, 1+ pitting edema in hands. Derm: Warm, dry. Superficial ulceration on L stump site with clear drainage. Small sites nearby with scabbing Neuro: Motor and sensation intact globally Psych: Cooperative, pleasant   Labs:  CBC BMET  Recent Labs  Lab 04/26/23 1212  WBC 6.7  HGB 12.4*  HCT 40.9  PLT 261   Recent Labs  Lab 04/26/23 1212  NA 138  K 4.9  CL 103  CO2 24  BUN 41*  CREATININE 1.86*  GLUCOSE 171*  CALCIUM 8.7*     Pertinent additional labs: BNP: 137.3 Troponin: 10  EKG: Sinus rhythm  Imaging Studies Performed: DG Chest 2 View Result Date: 04/26/2023 IMPRESSION: No acute cardiopulmonary findings.    [name], MD 04/26/2023, 4:04 PM PGY-1, [facility]  FPTS Intern pager: [phone], text pages welcome Secure chat group [facility] Teaching Service    Upper Level Addendum:  I have seen and evaluated this patient along with Dr. Ardyth Harps and reviewed the above note, making necessary revisions as appropriate.  I agree with the medical decision making and  physical exam as noted above.  Alfredo Martinez, MD PGY-2 [facility] Res[facility]

## 2023-04-26 NOTE — Progress Notes (Signed)
New Admission Note:   Arrival Method: stretcher Mental Orientation: aa+ox4 Telemetry: box 9 Assessment: Completed Skin: abrasion to left stump IV: right antecub Pain: denies Tubes: N/A Safety Measures: Safety Fall Prevention Plan has been given, discussed and signed Admission: Completed [facility] Orientation: Patient has been orientated to the room, unit and staff.    Orders have been reviewed and implemented. Will continue to monitor the patient. Call light has been placed within reach and bed alarm has been activated.   Margarita Grizzle, RN

## 2023-04-26 NOTE — Assessment & Plan Note (Deleted)
Resolved, K 4.7

## 2023-04-26 NOTE — Plan of Care (Signed)
  Problem: Education: Goal: Ability to describe self-care measures that may prevent or decrease complications (Diabetes Survival Skills Education) will improve Outcome: Progressing Goal: Individualized Educational Video(s) Outcome: Progressing   Problem: Coping: Goal: Ability to adjust to condition or change in health will improve Outcome: Progressing   Problem: Fluid Volume: Goal: Ability to maintain a balanced intake and output will improve Outcome: Progressing   Problem: Health Behavior/Discharge Planning: Goal: Ability to identify and utilize available resources and services will improve Outcome: Progressing Goal: Ability to manage health-related needs will improve Outcome: Progressing   Problem: Metabolic: Goal: Ability to maintain appropriate glucose levels will improve Outcome: Progressing   Problem: Nutritional: Goal: Maintenance of adequate nutrition will improve Outcome: Progressing Goal: Progress toward achieving an optimal weight will improve Outcome: Progressing   Problem: Skin Integrity: Goal: Risk for impaired skin integrity will decrease Outcome: Progressing   Problem: Tissue Perfusion: Goal: Adequacy of tissue perfusion will improve Outcome: Progressing   Problem: Education: Goal: Ability to demonstrate management of disease process will improve Outcome: Progressing Goal: Ability to verbalize understanding of medication therapies will improve Outcome: Progressing Goal: Individualized Educational Video(s) Outcome: Progressing   Problem: Activity: Goal: Capacity to carry out activities will improve Outcome: Progressing   Problem: Cardiac: Goal: Ability to achieve and maintain adequate cardiopulmonary perfusion will improve Outcome: Progressing   

## 2023-04-26 NOTE — ED Provider Notes (Signed)
[facility] AT [facility] Provider Note   CSN: [id number] Arrival date & time: 04/26/23  1158     History  Chief Complaint  Patient presents with   Abnormal Labs    Joseph [name] is a 52 y.o. male with medical history significant for T2DM, bilateral BKA d/t diabetic foot, HLD, CAD s/p CABG, PE, CHF, PAD, aortic aneurysm, COPD, anxiety, GERD, HTN, and OSA presenting after cardiologist recommend patient goes to the ED for abnormal labs.  Lab collected outpatient yesterday shows slightly elevated BNP at 375, potassium of 5.9, creatinine 1.84 . Per wife who was at bedside patient was seen at the cardiology office yesterday for eye surgery clearance. At the visit there were concerns patient was volume overloaded and labs were ordered.  This morning received a call from the cardiology office stating that his creatinine is elevated and that he is volume overloaded. The office suggested need for diuresis with close monitoring of his kidney function.  Of note he was transitioned from Lasix to torsemide couple of months ago and wife reports he has not had good output on torsemide.  He also ran out of torsemide 5 days ago and hasn't had the med. Patient endorses having shortness of breath but not acutely unchanged from his baseline.   Home Medications Prior to Admission medications   Medication Sig Start Date End Date Taking? Authorizing Provider  moxifloxacin (VIGAMOX) 0.5 % ophthalmic solution Place 1 drop into the left eye 4 (four) times daily. 04/20/23  Yes [provider]  prednisoLONE acetate (PRED FORTE) 1 % ophthalmic suspension Place 1 drop into the left eye 4 (four) times daily. 04/20/23  Yes [provider]  acetaminophen (TYLENOL) 500 MG tablet Take 1,000 mg by mouth 3 (three) times daily as needed for headache.    [provider]  albuterol (VENTOLIN HFA) 108 (90 Base) MCG/ACT inhaler Inhale 2 puffs into the lungs every 6 (six) hours.     [provider]  ALPRAZolam Prudy Feeler) 1 MG tablet Take 1 mg by mouth at bedtime as needed for anxiety. Pt takes 2 mg 2 tablet as needed.    [provider]  amLODipine (NORVASC) 10 MG tablet Take 1 tablet (10 mg total) by mouth daily. 10/08/22   Gaston Islam., NP  aspirin EC 81 MG tablet Take 81 mg by mouth in the morning. Swallow whole.    [provider]  atorvastatin (LIPITOR) 80 MG tablet Take 1 tablet (80 mg total) by mouth daily at 6 PM. 05/31/19   Corrin Parker, PA-C  buPROPion (WELLBUTRIN XL) 150 MG 24 hr tablet Take 150 mg by mouth in the morning and at bedtime.    [provider]  calcium carbonate (TUMS EXTRA STRENGTH 750) 750 MG chewable tablet Chew 1,500 mg by mouth as needed (stomach pain).    [provider]  carvedilol (COREG) 12.5 MG tablet Take 12.5 mg by mouth 2 (two) times daily.    [provider]  carvedilol (COREG) 3.125 MG tablet Take 3.125 mg by mouth 2 (two) times daily. 02/22/22   [provider]  clopidogrel (PLAVIX) 75 MG tablet Take 1 tablet (75 mg total) by mouth daily with breakfast. 10/11/20   Lars Mage, PA-C  Continuous Blood Gluc Sensor (FREESTYLE LIBRE 14 DAY SENSOR) MISC Apply topically 4 (four) times daily. 09/23/22   [provider]  escitalopram (LEXAPRO) 10 MG tablet Take 10 mg by mouth in the morning. 01/27/22  [provider]  fluconazole (DIFLUCAN) 100 MG tablet Take 1 tablet by mouth once a week.    [provider]  fluticasone (FLONASE) 50 MCG/ACT nasal spray Place 2 sprays into both nostrils daily.    [provider]  furosemide (LASIX) 40 MG tablet TAKE 3 TABLETS BY MOUTH IN THE A.M, TAKE 2 TABLETS BY MOUTH IN THE AFTERNOON 04/25/23   Tereso Newcomer T, PA-C  icosapent Ethyl (VASCEPA) 1 g capsule Take 2 capsules (2 g total) by mouth 2 (two) times daily. 09/02/21   Chilton Si, MD  insulin aspart (NOVOLOG) 100 UNIT/ML injection Inject 3 Units  into the skin 3 (three) times daily with meals. 07/13/22   Dahal, Melina Schools, MD  insulin glargine (LANTUS SOLOSTAR) 100 UNIT/ML Solostar Pen ADMINISTER 20 UNITS UNDER THE SKIN TWICE DAILY    [provider]  insulin glargine-yfgn (SEMGLEE) 100 UNIT/ML injection Inject 0.2 mLs (20 Units total) into the skin 2 (two) times daily. 07/13/22   Lorin Glass, MD  loratadine (CLARITIN) 10 MG tablet Take 10 mg by mouth daily. 09/27/22   [provider]  meclizine (ANTIVERT) 25 MG tablet Take 25 mg by mouth 3 (three) times daily as needed. 08/27/22   [provider]  metFORMIN (GLUCOPHAGE) 1000 MG tablet Take 1 tablet (1,000 mg total) by mouth 2 (two) times daily with a meal. 11/05/19   Leone Brand, NP  naloxone [facility]) nasal spray 4 mg/0.1 mL Place 4 mg into the nose as needed (overdose).    [provider]  nitroGLYCERIN (NITROSTAT) 0.4 MG SL tablet Place 1 tablet (0.4 mg total) under the tongue every 5 (five) minutes as needed. 10/08/22   Gaston Islam., NP  oxyCODONE (ROXICODONE) 15 MG immediate release tablet Take 1 tablet (15 mg total) by mouth every 4 (four) hours as needed for moderate pain (pain score 4-6). 07/13/22   Lorin Glass, MD  pantoprazole (PROTONIX) 40 MG tablet Take 1 tablet (40 mg total) by mouth daily. 09/23/17   Hongalgi, Maximino Greenland, MD  pregabalin (LYRICA) 150 MG capsule Take 150 mg by mouth 3 (three) times daily. 09/10/22   [provider]  promethazine (PHENERGAN) 25 MG tablet Take 25 mg by mouth every 4 (four) hours as needed. 09/10/22   [provider]  ranolazine (RANEXA) 500 MG 12 hr tablet TAKE 1 TABLET(500 MG) BY MOUTH TWICE DAILY 10/01/22   Bhagat, Bhavinkumar, PA  rosuvastatin (CRESTOR) 40 MG tablet Take 1 tablet every day by oral route.    [provider]  Ubrogepant (UBRELVY) 100 MG TABS 100 mg oral once and may repeat 100 mg oral in 2 hrs (max dose 200 mg in 24 hr period) 06/30/22   [provider]       Allergies    Sulfa antibiotics    Review of Systems   Review of Systems  Constitutional:  Negative for chills, diaphoresis, fatigue and fever.  Respiratory:  Positive for shortness of breath. Negative for cough, choking, chest tightness and wheezing.   Cardiovascular:  Positive for leg swelling. Negative for chest pain and palpitations.  Gastrointestinal:  Positive for abdominal distention. Negative for abdominal pain, diarrhea, nausea and vomiting.    Physical Exam Updated Vital Signs BP 105/85   Pulse 81   Temp 98.2 F (36.8 C) (Oral)   Resp 18   SpO2 97%  Physical Exam Constitutional:      Appearance: Normal appearance. He is obese.  Eyes:     Extraocular Movements:  Extraocular movements intact.     Conjunctiva/sclera: Conjunctivae normal.     Pupils: Pupils are equal, round, and reactive to light.  Cardiovascular:     Rate and Rhythm: Normal rate and regular rhythm.     Pulses: Normal pulses.     Heart sounds: Normal heart sounds.  Pulmonary:     Effort: Pulmonary effort is normal.     Breath sounds: Normal breath sounds. No wheezing or rhonchi.  Abdominal:     General: Bowel sounds are normal. There is distension.     Palpations: Abdomen is soft.     Tenderness: There is no abdominal tenderness.  Musculoskeletal:        General: Normal range of motion.     Cervical back: Normal range of motion and neck supple.  Skin:    General: Skin is warm and dry.     Capillary Refill: Capillary refill takes less than 2 seconds.  Neurological:     General: No focal deficit present.     Mental Status: He is alert and oriented to person, place, and time. Mental status is at baseline.  Psychiatric:        Mood and Affect: Mood normal.        Behavior: Behavior normal.     ED Results / Procedures / Treatments   Labs (all labs ordered are listed, but only abnormal results are displayed) Labs Reviewed  BASIC METABOLIC PANEL - Abnormal; Notable for the following  components:      Result Value   Glucose, Bld 171 (*)    BUN 41 (*)    Creatinine, Ser 1.86 (*)    Calcium 8.7 (*)    GFR, Estimated 44 (*)    All other components within normal limits  CBC - Abnormal; Notable for the following components:   Hemoglobin 12.4 (*)    MCV 79.9 (*)    MCH 24.2 (*)    RDW 18.6 (*)    All other components within normal limits  BRAIN NATRIURETIC PEPTIDE - Abnormal; Notable for the following components:   B Natriuretic Peptide 137.3 (*)    All other components within normal limits  BASIC METABOLIC PANEL  HEMOGLOBIN A1C  TROPONIN I (HIGH SENSITIVITY)  TROPONIN I (HIGH SENSITIVITY)    EKG EKG Interpretation  Date/Time:  Tuesday Apr 26 2023 11:55:36 EDT Ventricular Rate:  84 PR Interval:    QRS Duration: 92 QT Interval:  342 QTC Calculation: 404 R Axis:   -30 Text Interpretation: Normal sinus rhythm Left axis deviation Low voltage QRS Inferior infarct , age undetermined Anterolateral infarct , age undetermined Abnormal ECG When compared with ECG of 01-Jul-2022 07:29, No significant change since last tracing Confirmed by Meridee Score [phone]) on 04/26/2023 12:19:38 PM  Radiology DG Chest 2 View  Result Date: 04/26/2023 CLINICAL DATA:  Shortness of breath and chest pain EXAM: CHEST - 2 VIEW COMPARISON:  06/30/2022 FINDINGS: The cardiac silhouette, mediastinal and hilar contours are within normal limits and stable. Stable surgical changes coronary artery bypass surgery. The lungs are clear of an acute process. No infiltrates, edema effusions. The bony thorax is intact. IMPRESSION: No acute cardiopulmonary findings. Electronically Signed   By: Rudie Meyer M.D.   On: 04/26/2023 13:08    Procedures Procedures    Medications Ordered in ED Medications  sodium chloride flush (NS) 0.9 % injection 3 mL (has no administration in time range)  sodium chloride flush (NS) 0.9 % injection 3 mL (has no administration in time range)  0.9 %  sodium chloride infusion  (has no administration in time range)  acetaminophen (TYLENOL) tablet 650 mg (has no administration in time range)  enoxaparin (LOVENOX) injection 40 mg (has no administration in time range)  furosemide (LASIX) injection 60 mg (has no administration in time range)  sodium zirconium cyclosilicate (LOKELMA) packet 10 g (has no administration in time range)  insulin glargine-yfgn (SEMGLEE) injection 15 Units (has no administration in time range)  insulin aspart (novoLOG) injection 0-15 Units (has no administration in time range)    ED Course/ Medical Decision Making/ A&P    Medical Decision Making Amount and/or Complexity of Data Reviewed Labs: ordered. Decision-making details documented in ED Course.    Details: Patient presenting at the request of their outpatient cardiologist for abnormal labs including elevated BNP, creatinine and potassium.  Ordered BNP, BNP and troponin which were independently reviewed by myself. Radiology: ordered and independent interpretation performed.    Details: CXR ordered given report of SOB. Imagining was unremarkable.   Risk Decision regarding hospitalization.   52 year old male with history of CHF presenting after abnormal lab with cardiologist outpatient.  The request of the cardiologist came in for inpatient management.  On exam appears to be volume overloaded likely due to combination of noncompliance and ineffectiveness of outpatient medication.  Overall appears to have anasarca and needing diuresis.  Appropriate for admission for diuresis with close monitoring of kidney function given AKI. Discussed case with Family Medicine team for admission.    Final Clinical Impression(s) / ED Diagnoses Final diagnoses:  None    Rx / DC Orders ED Discharge Orders     None         Jerre Simon, MD 04/26/23 1523    Lowther, Amy, DO 05/03/23 1726

## 2023-04-26 NOTE — Assessment & Plan Note (Deleted)
Per Cardiology, patient likely needs outpatient hematology follow up to discuss anticoagulation for multiple prior PEs.

## 2023-04-26 NOTE — ED Provider Triage Note (Signed)
Emergency Medicine Provider Triage Evaluation Note  Joseph [name] , a 52 y.o. male  was evaluated in triage.  Pt complains of chest pain, shortness of breath, was called by PCP regarding abnormal lab functions.  BUN and creatinine are both increased, creatinine increased from 0.4 from baseline of 1.4.  Potassium 5.9 yesterday.  Patient with signs of fluid overload on exam, but BNP not significantly elevated, BNP around 300.  He was instructed to come in due to concern for hyperkalemia, possible acute CHF.  He reports that he had chest pain that felt like squeezing this morning.  He denies any worse with exertion, reports that he is not walking at this time.  He has been taking Lasix, reports 3 20 mg tablets in the morning and 2 20 mg tablets in the evening..  Review of Systems  Positive: Chest pain, shortness of breath Negative: Fever, chills, nausea, vomiting  Physical Exam  BP 105/86 (BP Location: Right Arm)   Pulse 89   Resp (!) 22   SpO2 97%  Gen:   Awake, no distress   Resp:  Normal effort  MSK:   Moves extremities without difficulty  Other:  With below the knee amputation of left lower extremity, pitting edema in this and the unaffected extremity.  No crackles, rhonchi, wheezing, or focal consolidation on lung auscultation.  Medical Decision Making  Medically screening exam initiated at 12:10 PM.  Appropriate orders placed.  Dimitrius Cottam Troxler was informed that the remainder of the evaluation will be completed by another provider, this initial triage assessment does not replace that evaluation, and the importance of remaining in the ED until their evaluation is complete.  Workup initiated in triage    Olene Floss, PA-C 04/26/23 1210

## 2023-04-26 NOTE — Assessment & Plan Note (Deleted)
BP well controlled. -Continue home Amlodipine

## 2023-04-26 NOTE — ED Triage Notes (Signed)
Pt BIB [facility] with reports of abnormal lab values. Pt unsure of what values were abnormal. Pt has not complaints at this time.

## 2023-04-26 NOTE — Assessment & Plan Note (Addendum)
3.4L UOP yesterday, continues to have adequate diuresis.  -Continue home Coreg 12.5mg  BID

## 2023-04-26 NOTE — Telephone Encounter (Signed)
Notes faxed to surgeon. Tereso Newcomer, PA-C    04/26/2023 5:13 PM

## 2023-04-26 NOTE — Assessment & Plan Note (Deleted)
H/o chronic unstable angina, reports intermittent chest pain. States it may be related to hypertension but states he takes Nitro at home with relief. Currently asymptomatic and has not taken Nitro today. Troponin negative. -Monitor for worsening chest pain -Continue home Ranolazine 150mg  BID

## 2023-04-26 NOTE — Progress Notes (Signed)
CPAP placed in patients room for tonight's use.   04/26/23 1615  BiPAP/CPAP/SIPAP  $ Non-Invasive Ventilator  Non-Invasive Vent Set Up;Non-Invasive Vent Initial  $ Face Mask Medium Yes  BiPAP/CPAP/SIPAP Pt Type Adult  BiPAP/CPAP/SIPAP DREAMSTATIOND  BiPAP/CPAP /SiPAP Vitals  Resp (!) 9  MEWS Score/Color  MEWS Score 1  MEWS Score Color Chilton Si

## 2023-04-27 ENCOUNTER — Observation Stay (HOSPITAL_BASED_OUTPATIENT_CLINIC_OR_DEPARTMENT_OTHER): Payer: Medicare HMO

## 2023-04-27 ENCOUNTER — Observation Stay (HOSPITAL_COMMUNITY): Payer: Medicare HMO

## 2023-04-27 DIAGNOSIS — R0602 Shortness of breath: Secondary | ICD-10-CM | POA: Diagnosis not present

## 2023-04-27 DIAGNOSIS — N049 Nephrotic syndrome with unspecified morphologic changes: Secondary | ICD-10-CM

## 2023-04-27 DIAGNOSIS — I5033 Acute on chronic diastolic (congestive) heart failure: Secondary | ICD-10-CM | POA: Diagnosis not present

## 2023-04-27 LAB — BASIC METABOLIC PANEL
Anion gap: 7 (ref 5–15)
Anion gap: 8 (ref 5–15)
BUN: 38 mg/dL — ABNORMAL HIGH (ref 6–20)
BUN: 39 mg/dL — ABNORMAL HIGH (ref 6–20)
CO2: 28 mmol/L (ref 22–32)
CO2: 26 mmol/L (ref 22–32)
Calcium: 8.3 mg/dL — ABNORMAL LOW (ref 8.9–10.3)
Calcium: 8.6 mg/dL — ABNORMAL LOW (ref 8.9–10.3)
Chloride: 101 mmol/L (ref 98–111)
Chloride: 104 mmol/L (ref 98–111)
Creatinine, Ser: 1.7 mg/dL — ABNORMAL HIGH (ref 0.61–1.24)
Creatinine, Ser: 1.69 mg/dL — ABNORMAL HIGH (ref 0.61–1.24)
GFR, Estimated: 49 mL/min — ABNORMAL LOW (ref >60)
GFR, Estimated: 49 mL/min — ABNORMAL LOW (ref >60)
Glucose, Bld: 252 mg/dL — ABNORMAL HIGH (ref 70–99)
Glucose, Bld: 201 mg/dL — ABNORMAL HIGH (ref 70–99)
Potassium: 4.7 mmol/L (ref 3.5–5.1)
Potassium: 5.3 mmol/L — ABNORMAL HIGH (ref 3.5–5.1)
Sodium: 136 mmol/L (ref 135–145)
Sodium: 138 mmol/L (ref 135–145)

## 2023-04-27 LAB — ECHOCARDIOGRAM COMPLETE
AR max vel: 2.73 cm2
AV Area VTI: 2.83 cm2
AV Area mean vel: 2.77 cm2
AV Mean grad: 9 mmHg
AV Peak grad: 15.8 mmHg
Ao pk vel: 1.99 m/s
Area-P 1/2: 4.06 cm2
Calc EF: 58.3 %
MV VTI: 3.49 cm2
S' Lateral: 4.1 cm
Single Plane A2C EF: 62 %
Single Plane A4C EF: 52.1 %
Weight: 6211.68 oz

## 2023-04-27 LAB — GLUCOSE, CAPILLARY
Glucose-Capillary: 213 mg/dL — ABNORMAL HIGH (ref 70–99)
Glucose-Capillary: 240 mg/dL — ABNORMAL HIGH (ref 70–99)
Glucose-Capillary: 156 mg/dL — ABNORMAL HIGH (ref 70–99)
Glucose-Capillary: 287 mg/dL — ABNORMAL HIGH (ref 70–99)

## 2023-04-27 LAB — FERRITIN: Ferritin: 15 ng/mL — ABNORMAL LOW (ref 24–336)

## 2023-04-27 LAB — IRON AND TIBC
Iron: 38 ug/dL — ABNORMAL LOW (ref 45–182)
Saturation Ratios: 12 % — ABNORMAL LOW (ref 17.9–39.5)
TIBC: 321 ug/dL (ref 250–450)
UIBC: 283 ug/dL

## 2023-04-27 LAB — ALBUMIN: Albumin: 2.2 g/dL — ABNORMAL LOW (ref 3.5–5.0)

## 2023-04-27 IMAGING — CR DG FOOT COMPLETE 3+V*L*
3 series · 3 of 3 positions shown · non-contrast
Comparison: Left foot x-ray 11/17/2021

CLINICAL DATA: Foot pain

EXAM:
LEFT FOOT - COMPLETE 3+ VIEW

[foot ap]
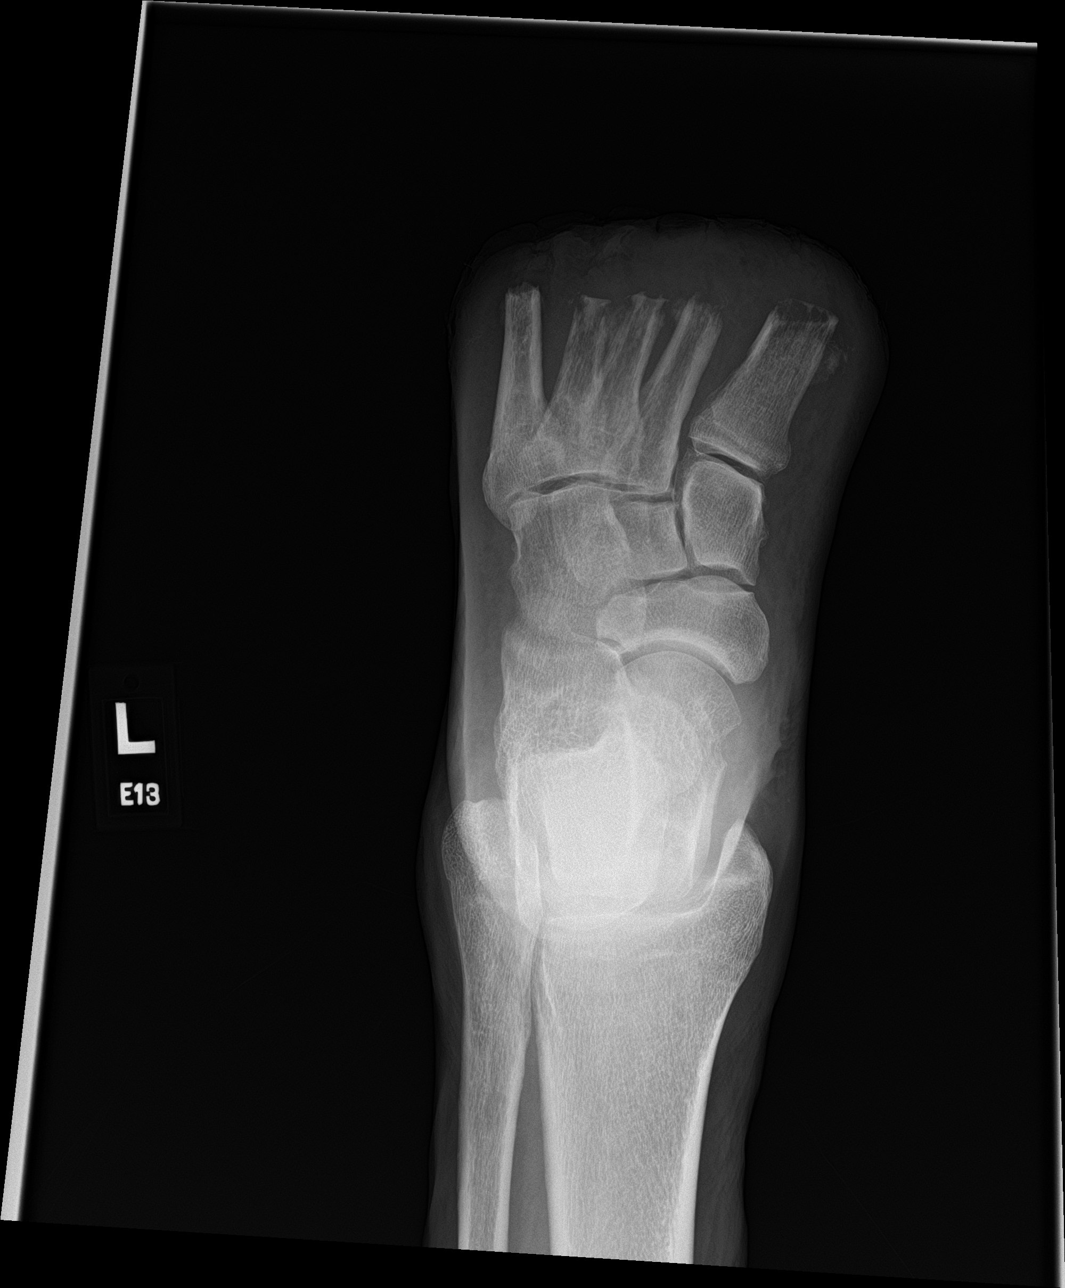

[foot obl]
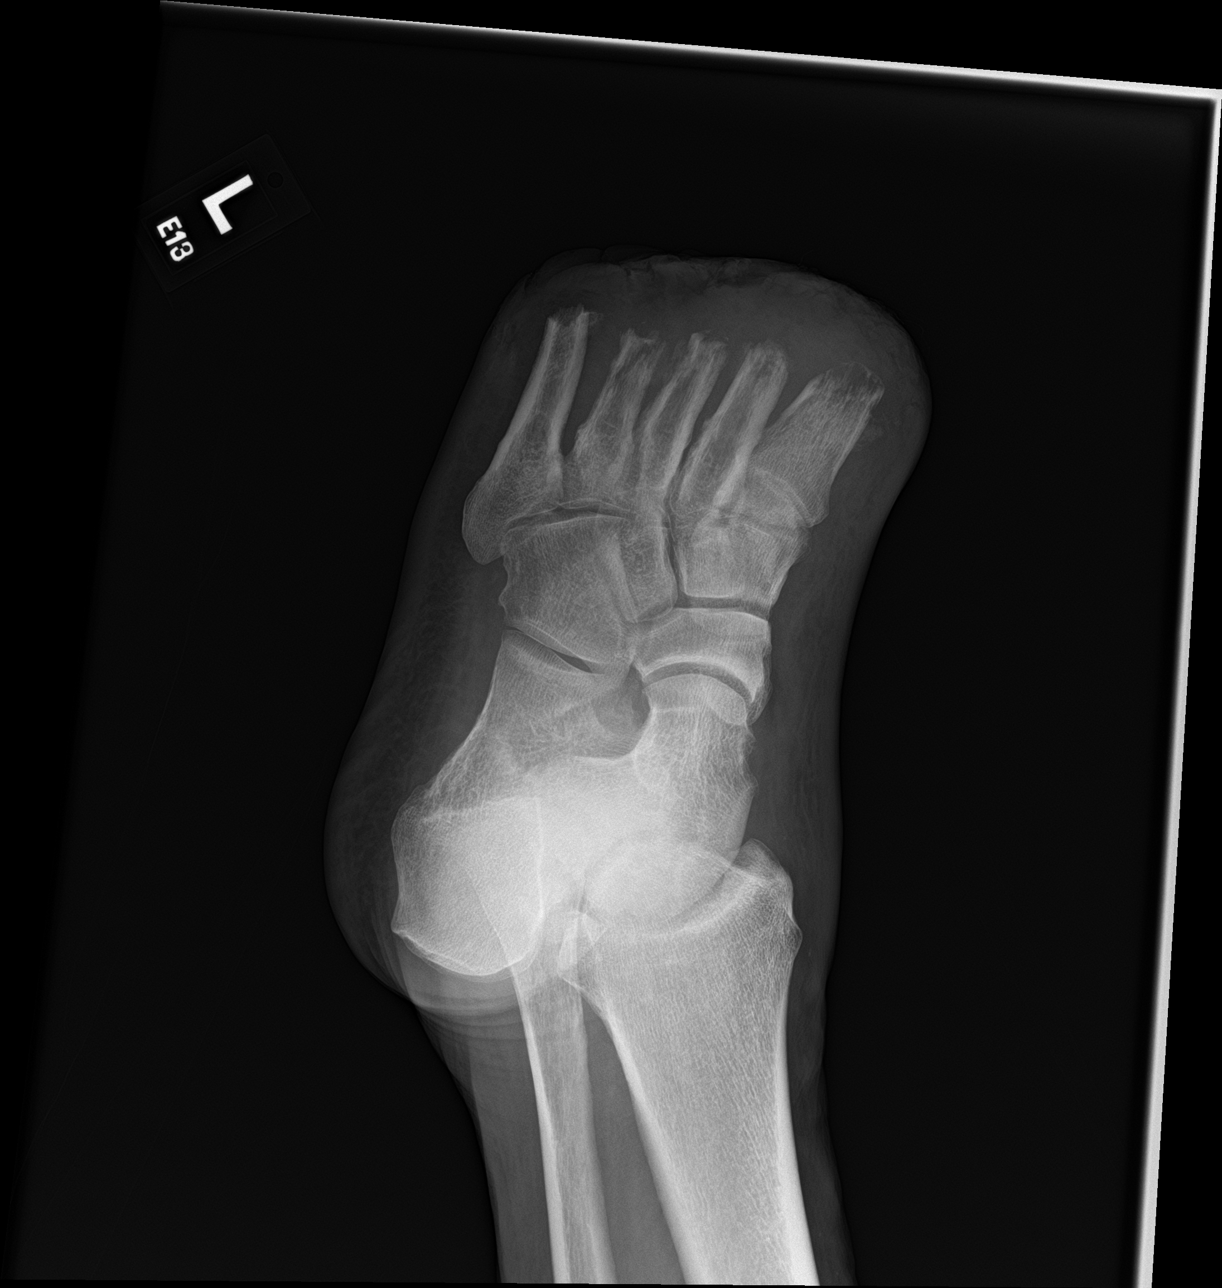

[foot lat]
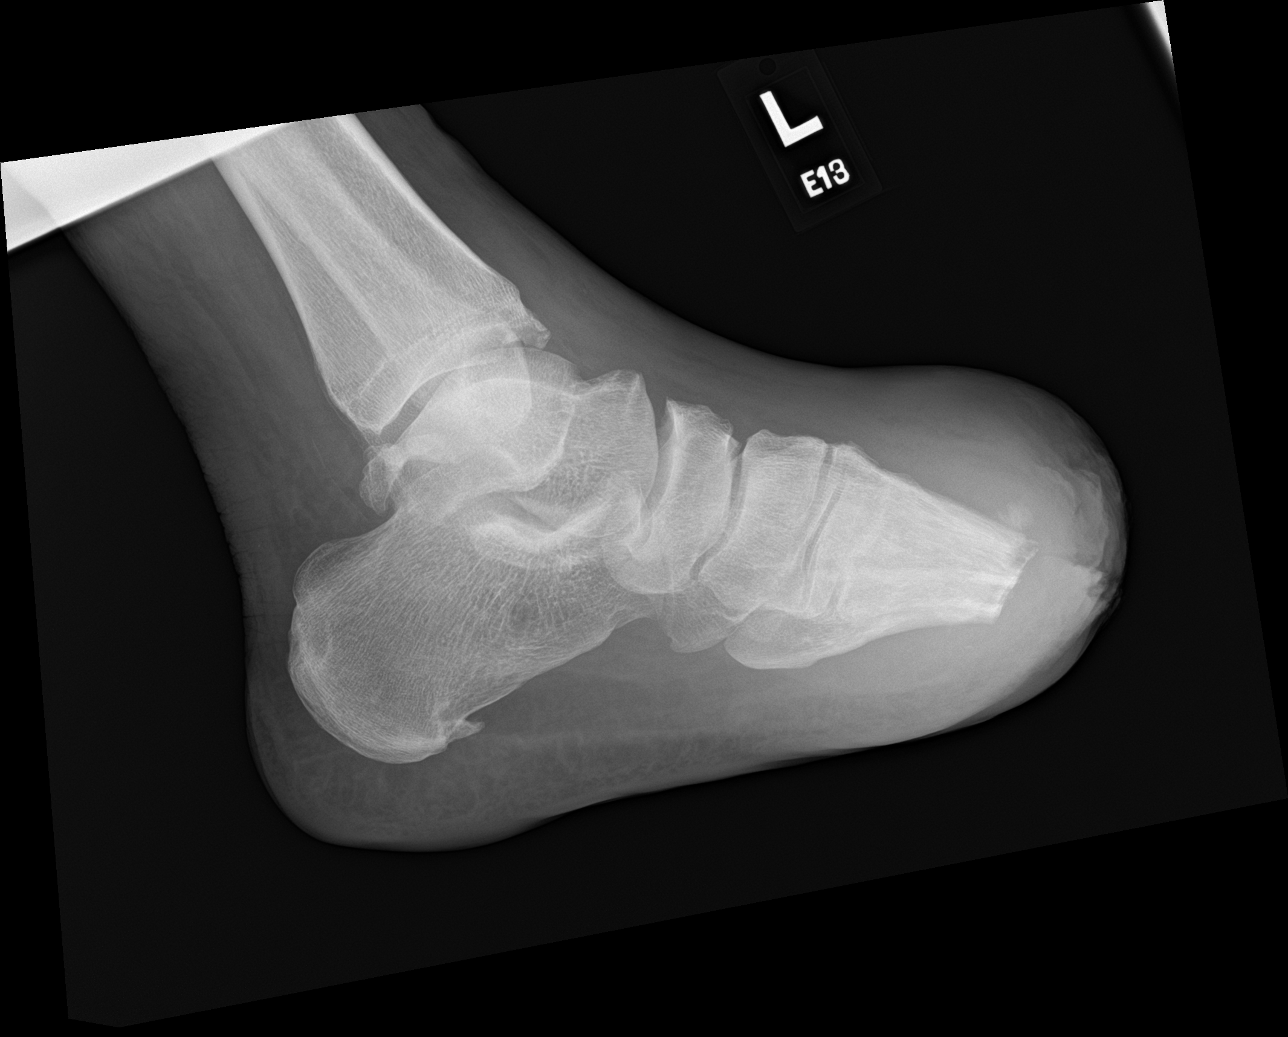

[3 of 3 positions shown; findings below may reference images not displayed]

FINDINGS: Interval distal transmetatarsal surgical amputation. There is
heterogeneous destructive appearance of the distal stumps of the
second through fourth metatarsals with adjacent periosteal reaction.
Marked soft tissue swelling of the foot stump.
IMPRESSION: Findings suggestive of osteomyelitis involving the distal metatarsal
stumps as described. Consider further evaluation with MRI.

## 2023-04-27 IMAGING — CR DG CHEST 1V
1 series · 1 of 1 positions shown · non-contrast
Comparison: Chest x-ray 11/17/2021

CLINICAL DATA: Fall, chest pain

EXAM:
CHEST  1 VIEW

[chest ap]
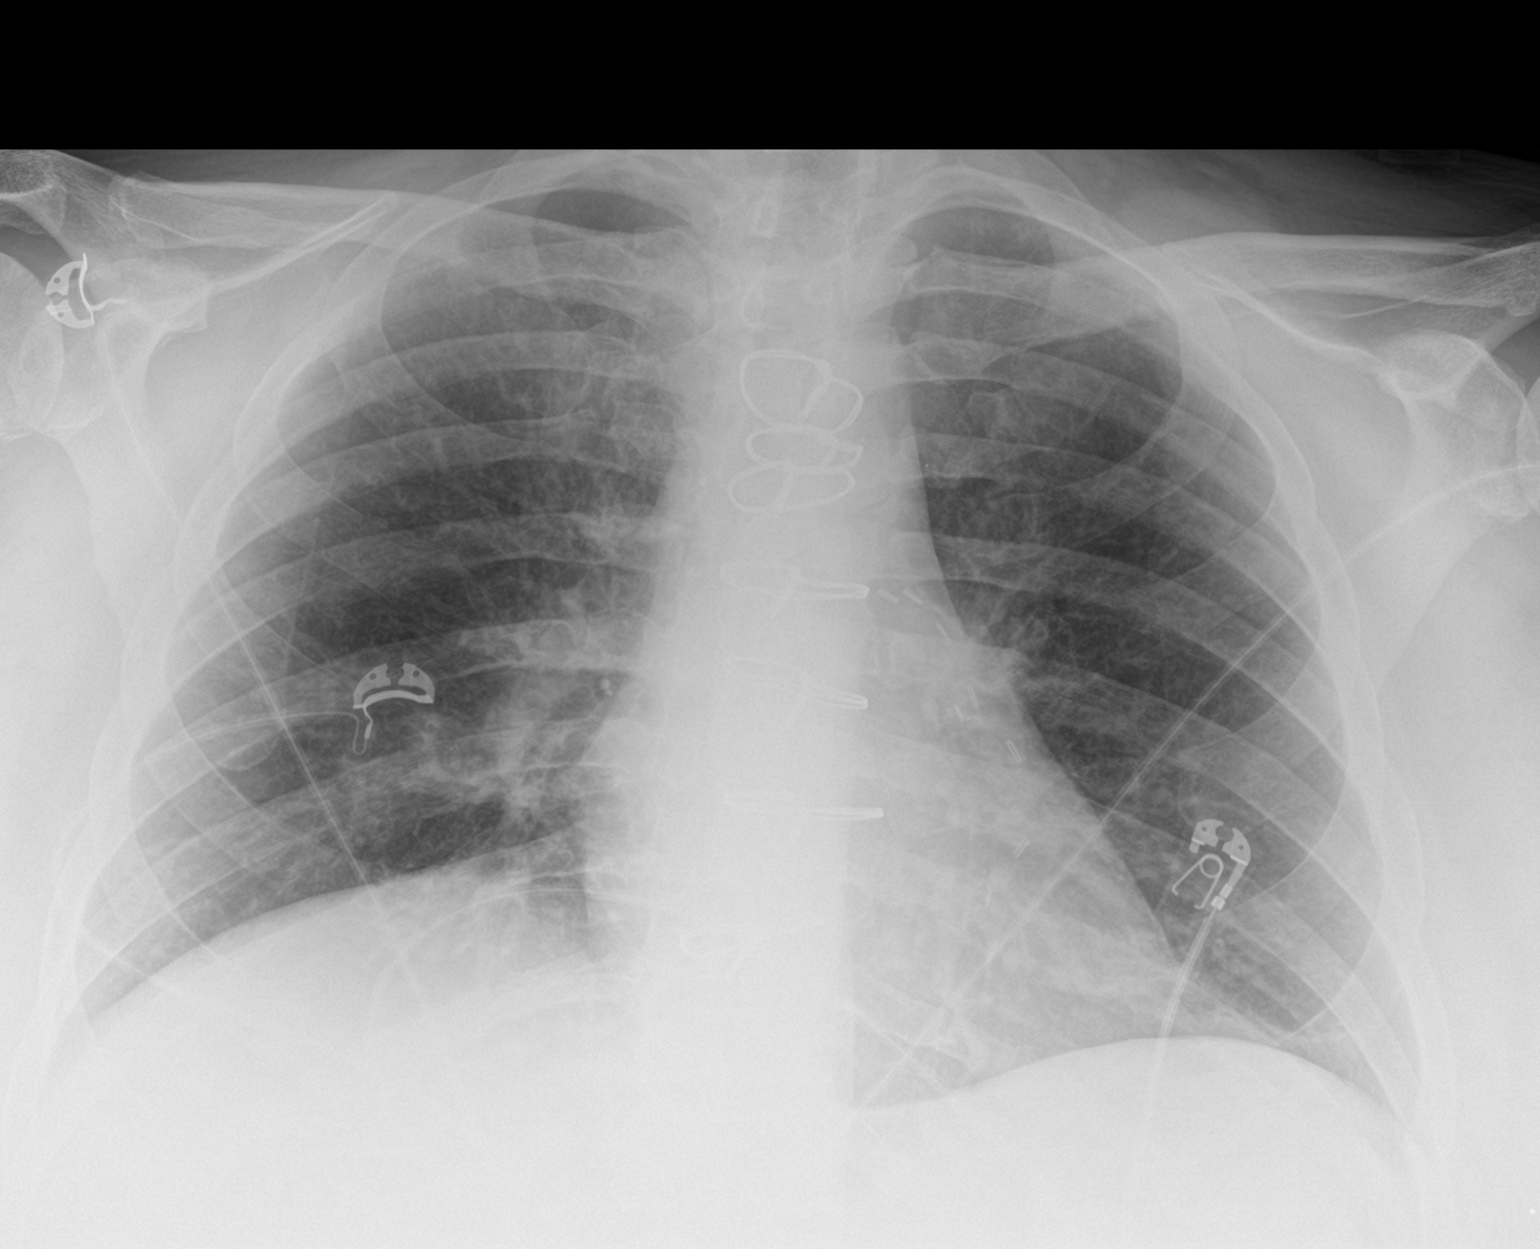

[1 of 1 positions shown; findings below may reference images not displayed]

FINDINGS: Heart size and mediastinal contours are within normal limits.
Cardiac surgical changes and median sternotomy wires. No suspicious
pulmonary opacities identified.

No pleural effusion or pneumothorax visualized.

No acute osseous abnormality appreciated.
IMPRESSION: No acute intrathoracic process identified.

## 2023-04-27 IMAGING — CT CT HEAD W/O CM
4 of 5 series · 15 of 47 positions shown, 17 images · non-contrast
Comparison: 09/18/2017

CLINICAL DATA: Fall, head trauma



[Series 2: head wo · axial · 0.50mm/px · z∈[-152,-87]mm · 3 of 34 slices shown (1 of 2)]
[im 7/34  brain]
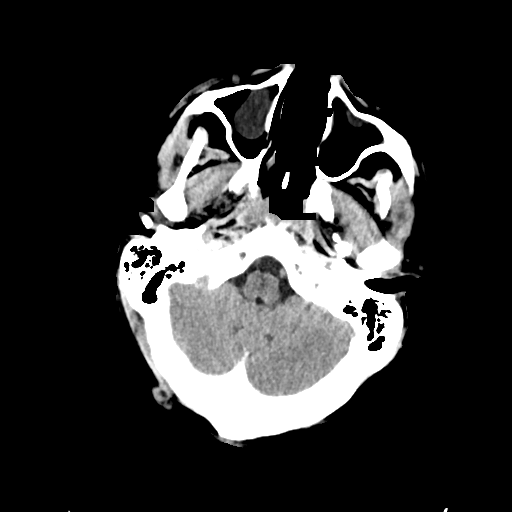
[im 14/34  brain]
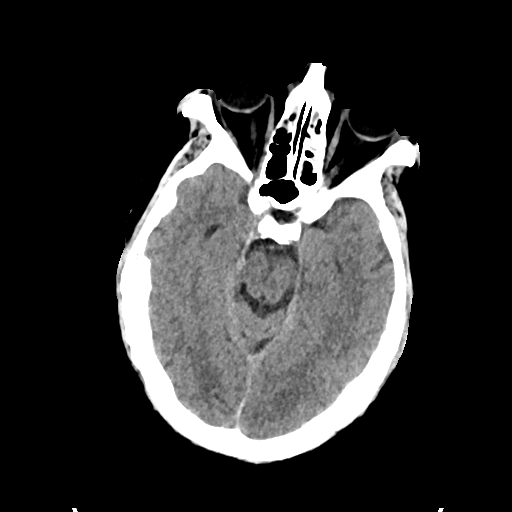
[im 20/34  brain]
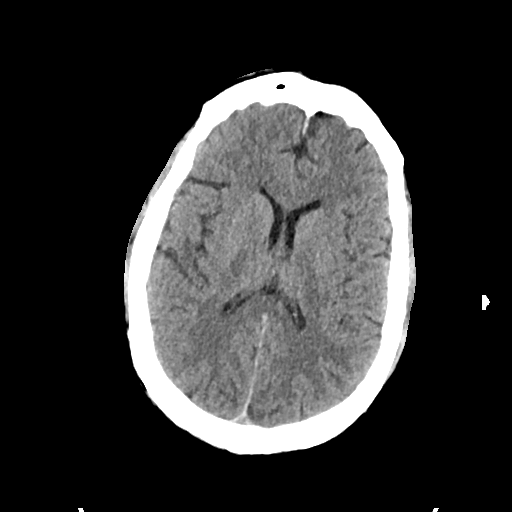

[Series 4: cor soft · coronal · 0.38mm/px · 3 of 83 slices shown]
[im 28/83  brain]
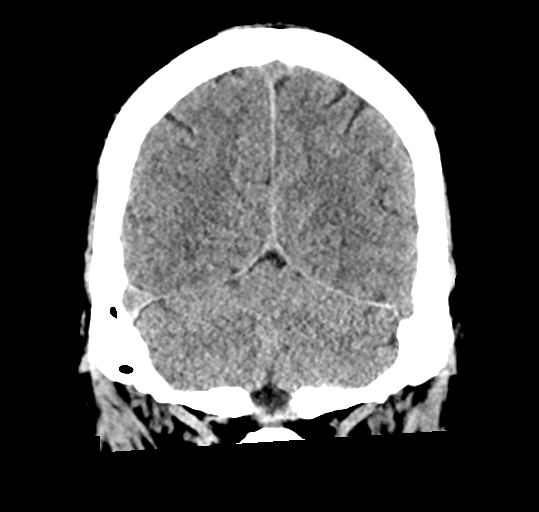
[im 37/83  brain]
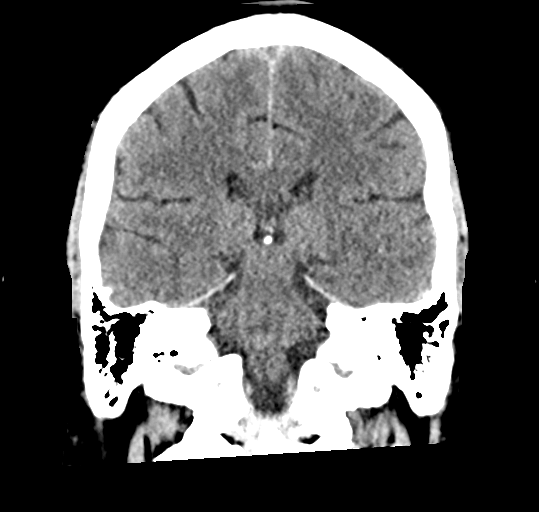
[im 46/83  brain]
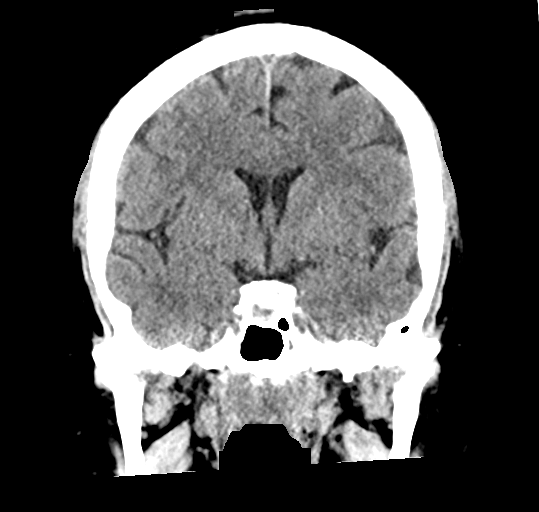

[Series 5: sag soft · sagittal · 0.38mm/px · 3 of 66 slices shown]
[im 22/66  brain]
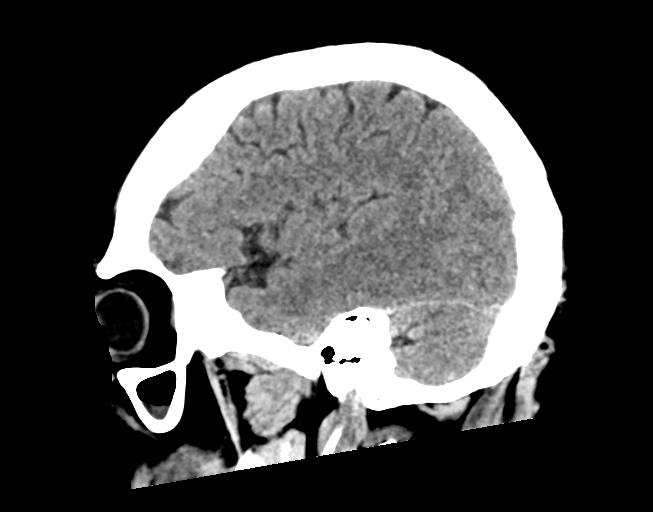
[im 33/66  brain]
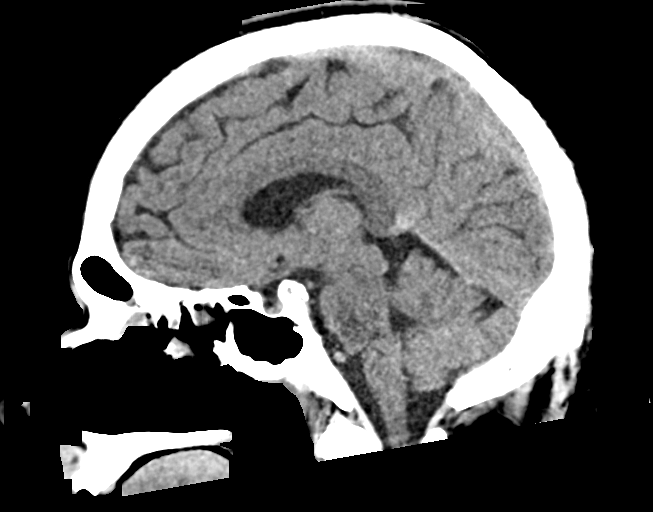
[im 44/66  brain]
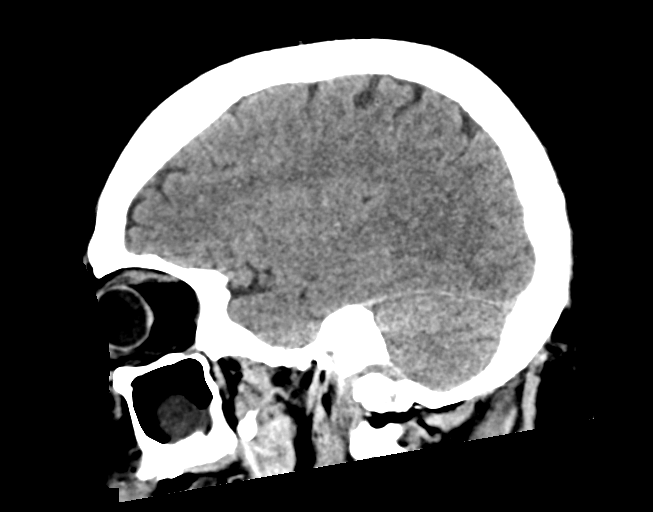

[Series 6: head wo · axial · 0.40mm/px · z∈[-146,-4]mm · 6 of 41 slices shown, 8 images (2 of 2)]
[im 6/41  brain]
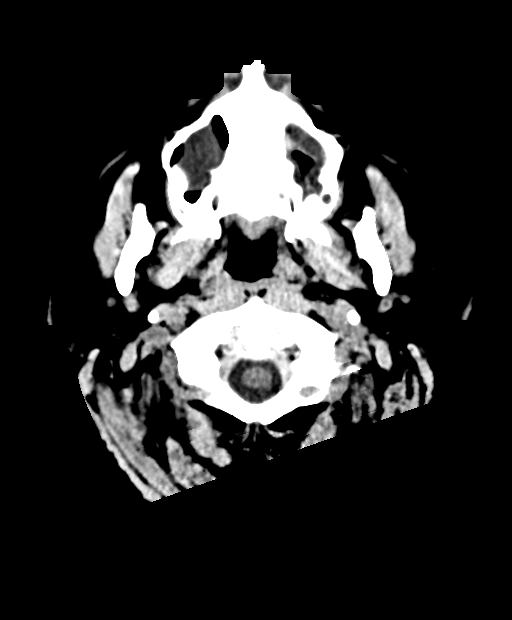
[im 6/41  bone]
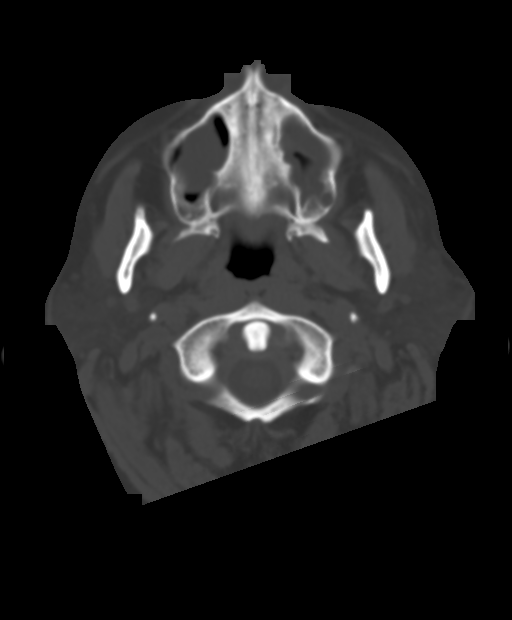
[im 12/41  brain]
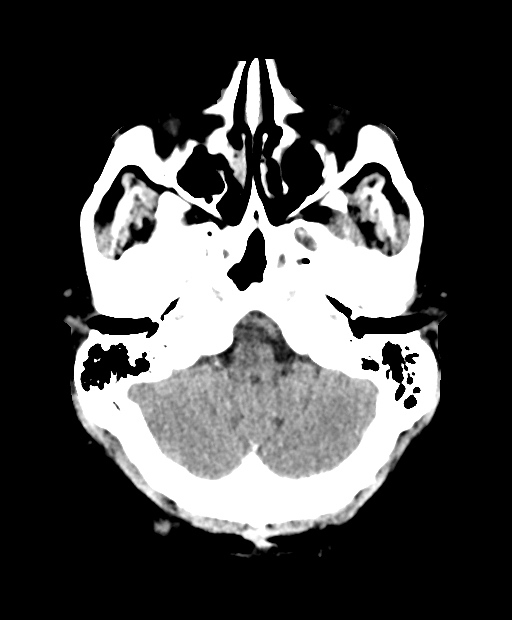
[im 18/41  brain]
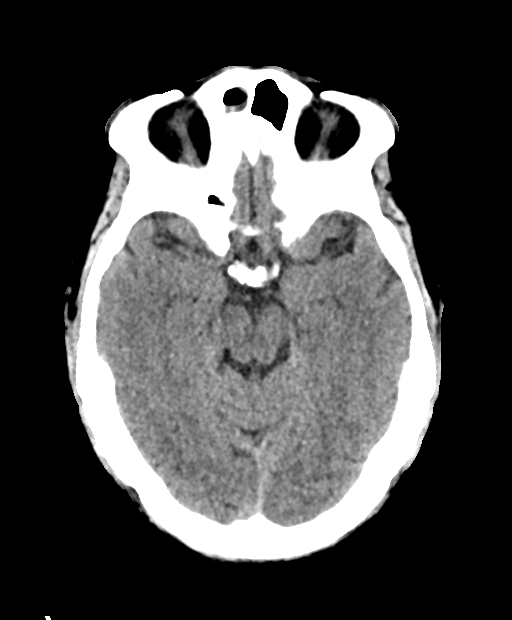
[im 23/41  brain]
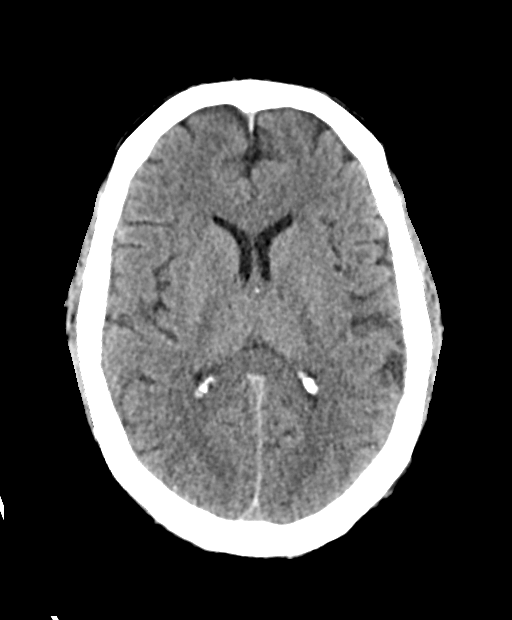
[im 29/41  brain]
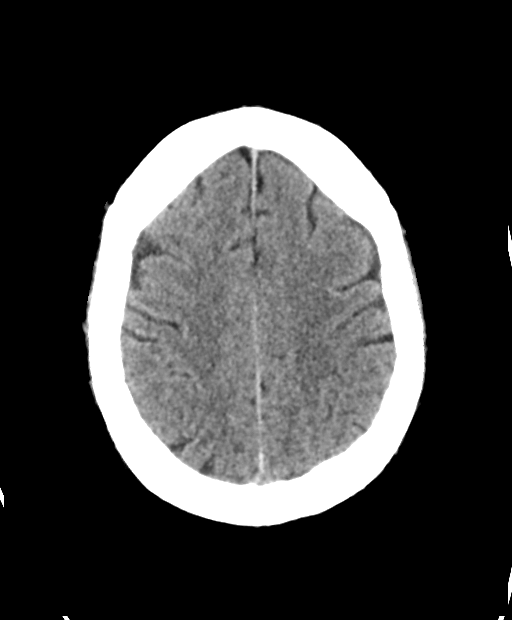
[im 29/41  bone]
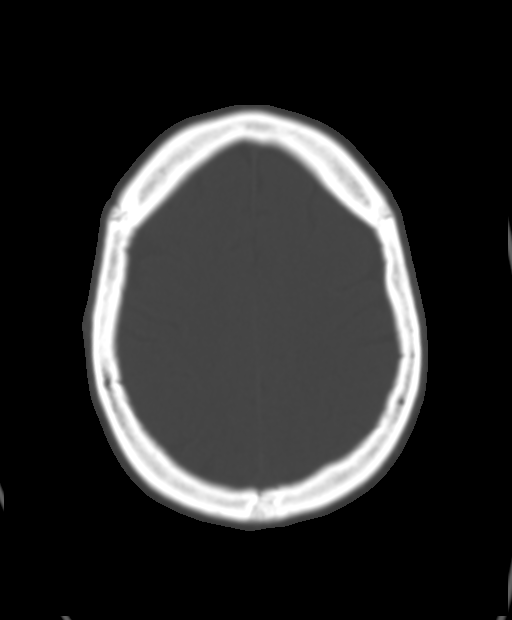
[im 35/41  brain]
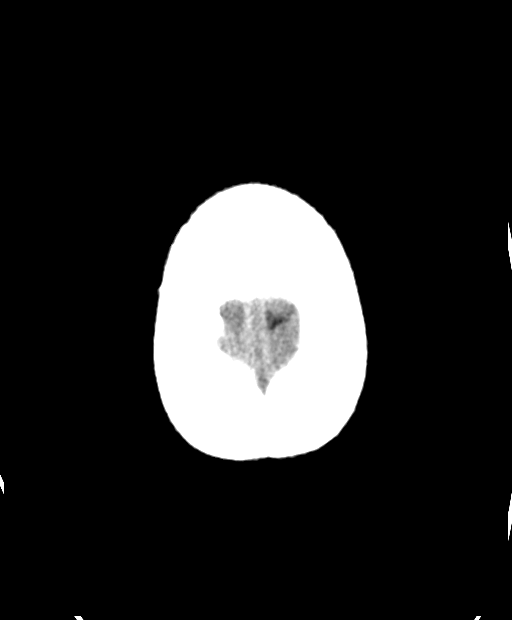

[15 of 47 positions shown; findings below may reference images not displayed]

FINDINGS: Brain: No evidence of acute infarction, hemorrhage, hydrocephalus,
extra-axial collection or mass lesion/mass effect.

Vascular: No hyperdense vessel or unexpected calcification.

Skull: Normal. Negative for fracture or focal lesion.

Sinuses/Orbits: No acute finding.

Other: Moderately severe diffuse pansinus mucosal thickened with
polypoid mucosal disease in the right maxillary sinus. Difficult to
exclude acute on chronic sinusitis. Mastoids are clear.
IMPRESSION: 1. No acute intracranial abnormality by noncontrast CT.
2. Pansinus disease as above.

## 2023-04-27 MED ORDER — ENOXAPARIN SODIUM 80 MG/0.8ML IJ SOSY
80.0000 mg | PREFILLED_SYRINGE | INTRAMUSCULAR | Status: DC
  Administered 2023-04-27 – 2023-05-02 (×6): 80 mg via SUBCUTANEOUS
  Filled 2023-04-27 (×7): qty 0.8

## 2023-04-27 MED ORDER — INSULIN GLARGINE-YFGN 100 UNIT/ML ~~LOC~~ SOLN
20.0000 [IU] | Freq: Two times a day (BID) | SUBCUTANEOUS | Status: DC
  Administered 2023-04-27 – 2023-04-28 (×2): 20 [IU] via SUBCUTANEOUS
  Filled 2023-04-27 (×3): qty 0.2

## 2023-04-27 MED ORDER — LORATADINE 10 MG PO TABS
10.0000 mg | ORAL_TABLET | Freq: Every day | ORAL | Status: DC
  Administered 2023-04-27 – 2023-05-03 (×7): 10 mg via ORAL
  Filled 2023-04-27 (×7): qty 1

## 2023-04-27 MED ORDER — PERFLUTREN LIPID MICROSPHERE: 1.0000 mL | INTRAVENOUS | Status: AC | PRN

## 2023-04-27 MED ORDER — INSULIN ASPART 100 UNIT/ML IJ SOLN
0.0000 [IU] | Freq: Three times a day (TID) | INTRAMUSCULAR | Status: DC
Start: 2023-04-28 — End: 2023-04-27

## 2023-04-27 MED ORDER — SODIUM ZIRCONIUM CYCLOSILICATE 10 G PO PACK
10.0000 g | PACK | Freq: Once | ORAL | Status: AC
  Administered 2023-04-27: 10 g via ORAL
  Filled 2023-04-27: qty 1

## 2023-04-27 MED ORDER — INSULIN ASPART 100 UNIT/ML IJ SOLN
0.0000 [IU] | Freq: Every day | INTRAMUSCULAR | Status: DC
  Administered 2023-04-27 – 2023-04-30 (×3): 3 [IU] via SUBCUTANEOUS

## 2023-04-27 MED ORDER — PERFLUTREN LIPID MICROSPHERE
1.0000 mL | INTRAVENOUS | Status: AC | PRN
  Administered 2023-04-27: 2 mL via INTRAVENOUS

## 2023-04-27 MED ORDER — INSULIN GLARGINE-YFGN 100 UNIT/ML ~~LOC~~ SOLN
5.0000 [IU] | Freq: Once | SUBCUTANEOUS | Status: AC
  Administered 2023-04-27: 5 [IU] via SUBCUTANEOUS
  Filled 2023-04-27: qty 0.05

## 2023-04-27 NOTE — Progress Notes (Signed)
Bipap set up and placed on on 20 max/8 min, pt stated he would call when ready to place on

## 2023-04-27 NOTE — Progress Notes (Signed)
RT readjusted patients CPAP mask and trial fitted. Patient stated he will place himself on device when ready for sleep.

## 2023-04-27 NOTE — Care Management Obs Status (Signed)
MEDICARE OBSERVATION STATUS NOTIFICATION   Patient Details  Name: Joseph [name] MRN: [id number] Date of Birth: October 26, 1971   Medicare Observation Status Notification Given:  Yes    Tom-Johnson, Hershal Coria, RN 04/27/2023, 2:46 PM

## 2023-04-27 NOTE — Evaluation (Signed)
Occupational Therapy Evaluation Patient Details Name: Joseph [name] MRN: [id number] DOB: 06-04-1971 Today's Date: 04/27/2023   History of Present Illness Pt is a 52 y/o male who presents 04/26/23 per his cardiologist's request 2 abnormal labs and volume overload. PMH signficant for AAA aneurysm, anxiety, asthma, CAD, COPD, DM, DVT, HNT, migraine, morbid obesity, MI, PE, peripheral nerve disease, B BKA with prosthetics, CABG, bilateral shoulder open rotator cuff repair.   Clinical Impression   At baseline pt is Mod I with ADLs and functional mobility with power wheelchair or RW (2 wheels) with use of B LE prosthetics. Pt currently demonstrates ability to perform self feeding, grooming, and UB/LB dressing and bathing with Mod I. Unable to assess pt functional level with all steps of toileting task on this day secondary to pt not have prosthetic present and no drop arm bedside commode available. OT to further assess and address pt functional level with all steps of toileting task during functional activity in future session with use of drop arm bedside commode. Pt states he has difficulty managing stress and anger related to health condition and daily life stressor. Pt will benefit from acute skilled OT to instruct pt in stress management and self-behavior management techniques for improved self-regulation and quality of life. At this time, no skilled OT services are indicated post acute discharge.      Recommendations for follow up therapy are one component of a multi-disciplinary discharge planning process, led by the attending physician.  Recommendations may be updated based on patient status, additional functional criteria and insurance authorization.   Assistance Recommended at Discharge PRN  Patient can return home with the following Assistance with cooking/housework;Assist for transportation;Help with stairs or ramp for entrance;A little help with bathing/dressing/bathroom    Functional Status  Assessment  Patient has had a recent decline in their functional status and demonstrates the ability to make significant improvements in function in a reasonable and predictable amount of time.  Equipment Recommendations  None recommended by OT    Recommendations for Other Services       Precautions / Restrictions Precautions Precautions: Fall Required Braces or Orthoses: Other Brace Other Brace: Prosthetics, not present in room on eval Restrictions Weight Bearing Restrictions: Yes LLE Weight Bearing: Non weight bearing Other Position/Activity Restrictions: Pt reports MD wants him to refrain from using prosthetic due to wound at this time.      Mobility Bed Mobility Overal bed mobility: Modified Independent             General bed mobility comments: Pt was able to transition to/from EOB without assist. HOB elevated.    Transfers                   General transfer comment: Did not progress to OOB transfers at this time as pt does not have prosthetics present, and no drop arm equipment was available at the time.      Balance Overall balance assessment: Modified Independent Sitting-balance support: No upper extremity supported, Feet unsupported Sitting balance-Leahy Scale: Good                                     ADL either performed or assessed with clinical judgement   ADL Overall ADL's : Needs assistance/impaired Eating/Feeding: Independent   Grooming: Independent;Sitting   Upper Body Bathing: Independent;Sitting   Lower Body Bathing: Modified independent;Sitting/lateral leans   Upper Body Dressing : Independent;Sitting  Lower Body Dressing: Modified independent;Sitting/lateral leans     Toilet Transfer Details (indicate cue type and reason): Did not progress as pt did not have prosthetics present and no drop arm BSC was available. OT to assess further during functional task. Toileting- Clothing Manipulation and Hygiene: Modified  independent;Sitting/lateral lean Toileting - Clothing Manipulation Details (indicate cue type and reason): Simulated tasks as no drop arm BSC was available. OT to futher assess during functional task.             Vision Baseline Vision/History: 1 Wears glasses Ability to See in Adequate Light: 0 Adequate Patient Visual Report: No change from baseline       Perception     Praxis      Pertinent Vitals/Pain Pain Assessment Pain Assessment: Faces Faces Pain Scale: Hurts whole lot Pain Location: R shoulder, L residual limb Pain Descriptors / Indicators: Discomfort, Grimacing, Guarding, Sore Pain Intervention(s): Limited activity within patient's tolerance, Monitored during session     Hand Dominance Right   Extremity/Trunk Assessment Upper Extremity Assessment Upper Extremity Assessment: Generalized weakness;RUE deficits/detail RUE Deficits / Details: Pt reports baseline pain and weakness in the R shoulder due to prior injury and repair. RUE: Shoulder pain with ROM;Shoulder pain at rest RUE Sensation: WNL RUE Coordination: WNL   Lower Extremity Assessment Lower Extremity Assessment: Defer to PT evaluation   Cervical / Trunk Assessment Cervical / Trunk Assessment: Other exceptions Cervical / Trunk Exceptions: Forward head posture with rounded shoulders   Communication Communication Communication: No difficulties   Cognition Arousal/Alertness: Awake/alert Behavior During Therapy: WFL for tasks assessed/performed Overall Cognitive Status: Within Functional Limits for tasks assessed                                       General Comments  Pt reports often feeling angry and feeling unable to control/manage challenging emotions. OT educated pt on role of OT to instruct in techniques for stress management and behavior self-management techniques with pt stating he would like to learn new strategies to manage stress, anger, and behaviors.    Exercises      Shoulder Instructions      Home Living Family/patient expects to be discharged to:: Private residence Living Arrangements: Spouse/significant other Available Help at Discharge: Family;Available 24 hours/day Type of Home: House Home Access: Ramped entrance     Home Layout: One level     Bathroom Shower/Tub: Chief Strategy Officer: Standard Bathroom Accessibility: No   Home Equipment: Wheelchair - power;Shower seat;Cane - single point;Wheelchair - manual   Additional Comments: Staying with an aunt/uncle.      Prior Functioning/Environment Prior Level of Function : Needs assist             Mobility Comments: Using a RW vs motorized w/c ADLs Comments: Pt previously Independent to Mod I with ADLs. Pt reports scooting into bathroom on his bottom then "Standing" on residual B LE to pull himself up and into tub.        OT Problem List: Decreased strength;Other (comment);Decreased activity tolerance (Behavior management)      OT Treatment/Interventions:      OT Goals(Current goals can be found in the care plan section) Acute Rehab OT Goals Patient Stated Goal: To learn to manage stress and anger better OT Goal Formulation: With patient Time For Goal Achievement: 05/11/23 Potential to Achieve Goals: Good ADL Goals Pt Will Transfer to Toilet:  with modified independence (with drop arm bedside commode) Pt Will Perform Toileting - Clothing Manipulation and hygiene: with modified independence;sitting/lateral leans Additional ADL Goal #1: Pt will demonstrate ability to Independently demonstrate through teach back three stress management techniques for improved self-regulaiton and increased quality of life.  OT Frequency: Min 2X/week    Co-evaluation              AM-PAC OT "6 Clicks" Daily Activity     Outcome Measure Help from another person eating meals?: None Help from another person taking care of personal grooming?: None Help from another person  toileting, which includes using toliet, bedpan, or urinal?: A Little Help from another person bathing (including washing, rinsing, drying)?: None Help from another person to put on and taking off regular upper body clothing?: None Help from another person to put on and taking off regular lower body clothing?: None 6 Click Score: 23   End of Session Nurse Communication: Mobility status  Activity Tolerance: Patient tolerated treatment well Patient left: in bed;with call bell/phone within reach  OT Visit Diagnosis: Muscle weakness (generalized) (M62.81)                Time: 9147-8295 OT Time Calculation (min): 37 min Charges:  OT General Charges $OT Visit: 1 Visit OT Evaluation $OT Eval Low Complexity: 1 Low OT Treatments $Self Care/Home Management : 8-22 mins  Shakeya Kerkman "Orson Eva., OTR/L, MA Acute Rehab [phone]   Lendon Colonel 04/27/2023, 6:00 PM

## 2023-04-27 NOTE — Plan of Care (Signed)
Patient AOX4, VSS throughout shift.  All meds given on time as ordered.  Diminished lungs, IS encouraged. Generalized edema, left leg and arm a little bigger than right leg and arm.  Non redness or pain.  Pt voided over in urinal.  Bilateral BKA.  Left stump wound dressing intact.  POC maintained, will continue to monitor.  Problem: Education: Goal: Ability to describe self-care measures that may prevent or decrease complications (Diabetes Survival Skills Education) will improve Outcome: Progressing Goal: Individualized Educational Video(s) Outcome: Progressing   Problem: Coping: Goal: Ability to adjust to condition or change in health will improve Outcome: Progressing   Problem: Fluid Volume: Goal: Ability to maintain a balanced intake and output will improve Outcome: Progressing   Problem: Health Behavior/Discharge Planning: Goal: Ability to identify and utilize available resources and services will improve Outcome: Progressing Goal: Ability to manage health-related needs will improve Outcome: Progressing   Problem: Metabolic: Goal: Ability to maintain appropriate glucose levels will improve Outcome: Progressing   Problem: Nutritional: Goal: Maintenance of adequate nutrition will improve Outcome: Progressing Goal: Progress toward achieving an optimal weight will improve Outcome: Progressing   Problem: Skin Integrity: Goal: Risk for impaired skin integrity will decrease Outcome: Progressing   Problem: Tissue Perfusion: Goal: Adequacy of tissue perfusion will improve Outcome: Progressing   Problem: Education: Goal: Ability to demonstrate management of disease process will improve Outcome: Progressing Goal: Ability to verbalize understanding of medication therapies will improve Outcome: Progressing Goal: Individualized Educational Video(s) Outcome: Progressing   Problem: Activity: Goal: Capacity to carry out activities will improve Outcome: Progressing    Problem: Cardiac: Goal: Ability to achieve and maintain adequate cardiopulmonary perfusion will improve Outcome: Progressing   Problem: Education: Goal: Knowledge of General Education information will improve Description: Including pain rating scale, medication(s)/side effects and non-pharmacologic comfort measures Outcome: Progressing   Problem: Health Behavior/Discharge Planning: Goal: Ability to manage health-related needs will improve Outcome: Progressing   Problem: Clinical Measurements: Goal: Ability to maintain clinical measurements within normal limits will improve Outcome: Progressing Goal: Will remain free from infection Outcome: Progressing Goal: Diagnostic test results will improve Outcome: Progressing Goal: Respiratory complications will improve Outcome: Progressing Goal: Cardiovascular complication will be avoided Outcome: Progressing   Problem: Activity: Goal: Risk for activity intolerance will decrease Outcome: Progressing   Problem: Nutrition: Goal: Adequate nutrition will be maintained Outcome: Progressing   Problem: Coping: Goal: Level of anxiety will decrease Outcome: Progressing   Problem: Elimination: Goal: Will not experience complications related to bowel motility Outcome: Progressing Goal: Will not experience complications related to urinary retention Outcome: Progressing   Problem: Pain Managment: Goal: General experience of comfort will improve Outcome: Progressing   Problem: Safety: Goal: Ability to remain free from injury will improve Outcome: Progressing   Problem: Skin Integrity: Goal: Risk for impaired skin integrity will decrease Outcome: Progressing

## 2023-04-27 NOTE — Evaluation (Signed)
Physical Therapy Evaluation  Patient Details Name: Joseph [name] MRN: [id number] DOB: [birth year] Today's Date: 04/27/2023  History of Present Illness  Pt is a 52 y/o male who presents 04/26/23 per his cardiologist's request 2 abnormal labs and volume overload. PMH signficant for AAA aneurysm, anxiety, asthma, CAD, COPD, DM, DVT, HNT, migraine, morbid obesity, MI, PE, peripheral nerve disease, B BKA with prosthetics, CABG, bilateral shoulder open rotator cuff repair.   Clinical Impression  Pt admitted with above diagnosis. Pt currently with functional limitations due to the deficits listed below (see PT Problem List). At the time of PT eval pt was able to perform transfers to/from EOB with modified independence. No drop arm equipment was available to progress to OOB mobility, but discussed with RN after session for drop arm BSC. Pt does not have prosthetics present at the hospital at this time. Pt will benefit from acute skilled PT to increase their independence and safety with mobility to allow discharge.          Recommendations for follow up therapy are one component of a multi-disciplinary discharge planning process, led by the attending physician.  Recommendations may be updated based on patient status, additional functional criteria and insurance authorization.  Follow Up Recommendations       Assistance Recommended at Discharge Set up Supervision/Assistance  Patient can return home with the following  A little help with walking and/or transfers;Assistance with cooking/housework;Assist for transportation;Help with stairs or ramp for entrance    Equipment Recommendations Rolling walker (2 wheels) (Bariatric size and to accommodate 6'5" height)  Recommendations for Other Services       Functional Status Assessment Patient has had a recent decline in their functional status and demonstrates the ability to make significant improvements in function in a reasonable and predictable amount of  time.     Precautions / Restrictions Precautions Precautions: Fall Required Braces or Orthoses: Other Brace Other Brace: Prosthetics, not present in room on eval Restrictions Weight Bearing Restrictions: Yes LLE Weight Bearing: Non weight bearing Other Position/Activity Restrictions: Pt reports MD wants him to refrain from using prosthetic due to wound at this time.      Mobility  Bed Mobility Overal bed mobility: Modified Independent             General bed mobility comments: Pt was able to transition to/from EOB without assist. HOB elevated.    Transfers                   General transfer comment: Did not progress to OOB transfers at this time as pt does not have prosthetics present, and no drop arm equipment was available at the time.    Ambulation/Gait                  Stairs            Wheelchair Mobility    Modified Rankin (Stroke Patients Only)       Balance Overall balance assessment: Needs assistance Sitting-balance support: Feet unsupported, No upper extremity supported Sitting balance-Leahy Scale: Good                                       Pertinent Vitals/Pain Pain Assessment Pain Assessment: Faces Faces Pain Scale: Hurts whole lot Pain Location: R shoulder, L residual limb Pain Descriptors / Indicators: Discomfort, Grimacing, Guarding, Sore Pain Intervention(s): Limited activity within patient's tolerance, Monitored during  session, Repositioned    Home Living Family/patient expects to be discharged to:: Private residence Living Arrangements: Spouse/significant other Available Help at Discharge: Family;Available 24 hours/day Type of Home: House Home Access: Ramped entrance       Home Layout: One level Home Equipment: Wheelchair - power;Shower seat;Cane - single point;Wheelchair - manual Additional Comments: Staying with an aunt/uncle.    Prior Function Prior Level of Function : Needs assist              Mobility Comments: Using a RW vs motorized w/c ADLs Comments: Pt reports that he scoots on his bottom into the bathroom and then "stands" on his residual limbs to enter his tub. He was independent with ADL's PTA     Hand Dominance   Dominant Hand: Right    Extremity/Trunk Assessment   Upper Extremity Assessment Upper Extremity Assessment: RUE deficits/detail RUE Deficits / Details: Pt reports baseline pain and weakness in the R shoulder due to prior injury and repair.    Lower Extremity Assessment Lower Extremity Assessment: Overall WFL for tasks assessed (B BKA. 5/5 strength in quads, hamstrings; 4+/5 strength in hip flexors. Noted wound on L residual limb with foam dressing applied.)    Cervical / Trunk Assessment Cervical / Trunk Assessment: Other exceptions Cervical / Trunk Exceptions: Forward head posture with rounded shoulders  Communication   Communication: No difficulties  Cognition Arousal/Alertness: Awake/alert Behavior During Therapy: WFL for tasks assessed/performed Overall Cognitive Status: Within Functional Limits for tasks assessed                                          General Comments      Exercises     Assessment/Plan    PT Assessment Patient needs continued PT services  PT Problem List Decreased strength;Decreased activity tolerance;Decreased balance;Decreased mobility;Decreased knowledge of use of DME;Decreased safety awareness;Decreased knowledge of precautions;Pain       PT Treatment Interventions DME instruction;Gait training;Functional mobility training;Therapeutic activities;Therapeutic exercise;Balance training;Patient/family education;Wheelchair mobility training    PT Goals (Current goals can be found in the Care Plan section)  Acute Rehab PT Goals Patient Stated Goal: Return to independence PT Goal Formulation: With patient Time For Goal Achievement: 05/04/23 Potential to Achieve Goals: Good    Frequency  Min 2X/week     Co-evaluation               AM-PAC PT "6 Clicks" Mobility  Outcome Measure Help needed turning from your back to your side while in a flat bed without using bedrails?: None Help needed moving from lying on your back to sitting on the side of a flat bed without using bedrails?: None Help needed moving to and from a bed to a chair (including a wheelchair)?: A Little Help needed standing up from a chair using your arms (e.g., wheelchair or bedside chair)?: A Lot Help needed to walk in hospital room?: A Lot Help needed climbing 3-5 steps with a railing? : Total 6 Click Score: 16    End of Session   Activity Tolerance: Patient tolerated treatment well Patient left: in bed;with call bell/phone within reach Nurse Communication: Mobility status PT Visit Diagnosis: Other abnormalities of gait and mobility (R26.89);Difficulty in walking, not elsewhere classified (R26.2) Pain - part of body: Shoulder;Leg    Time: 2956-2130 PT Time Calculation (min) (ACUTE ONLY): 37 min   Charges:   PT Evaluation $PT Eval  Moderate Complexity: 1 Mod          Conni Slipper, PT, DPT Acute Rehabilitation Services Secure Chat Preferred Office: [phone]   Marylynn Pearson 04/27/2023, 2:45 PM

## 2023-04-27 NOTE — Progress Notes (Signed)
2D echo attempted, patient unavailable. Will try later 

## 2023-04-27 NOTE — Assessment & Plan Note (Addendum)
Has yet to stabilize Cr, increased yet again to 2.24 despite backing off on diuresis. -Nephrology following, appreciate recs -Continue Torsemide 60mg  daily -Avoid nephrotoxic agents

## 2023-04-27 NOTE — Assessment & Plan Note (Addendum)
Most likely in the setting of diabetic disease. Patient will need to continue diuresis outpatient. SPEP negative. -Nephrology following, appreciate care -Farxiga 10mg  daily

## 2023-04-27 NOTE — Consult Note (Signed)
WOC Nurse Consult Note: patient known to WOC  team from previous admissions, had L BKA 01/08/22 patient states he got a new prosthesis and wore it too much making a wound on his stump  Reason for Consult: L stump wound  Wound type: full thickness, likely due to friction from prosthesis   Pressure Injury POA: NA  Measurement: 1.2 cms x 5 cms x 0.1 cm  Wound bed: 100% pink and moist after cleansing Drainage (amount, consistency, odor) minimal serosanguinous  Periwound: some mild erythema, scabs noted above wound that require no topical care  Dressing procedure/placement/frequency: Clean L stump wound with NS, apply a strip of silver hydrofiber [employer] [place] [phone]) to wound bed daily and cover with silicone foam.  May lift silicone foam to replace silver daily.  IF SILVER STUCK TO WOUND BED AT REMOVAL SOAK IN NS FOR ATRAUMATIC REMOVAL.  Change foam dressing q3 days and prn soiling.   Patient states he got his prosthesis through [employer], did encourage him to speak with [employer] about rubbing of prothesis to see if any modification can be made to prevent this in the future.    POC discussed with patient and bedside nurse.  WOC team will not follow.  Re-consult if further needs arise.   Thank you,    Priscella Mann MSN, RN-BC, [employer] [phone]

## 2023-04-27 NOTE — TOC Initial Note (Signed)
Transition of Care [facility]) - Initial/Assessment Note    Patient Details  Name: Joseph [name] MRN: [id number] Date of Birth: 10-28-1971  Transition of Care [facility]) CM/SW Contact:    Tom-Johnson, Hershal Coria, RN Phone Number: 04/27/2023, 3:02 PM  Clinical Narrative:                  CM spoke with patient at bedside about needs for post hospital transition. Admitted for Heart Failure with Preserved Ejection Fraction. Currently on room air. On IV lasix and Prednisone.  From home with wife and only son. Both parents supportive with care. On disability, uses Psychologist, educational to and from appointments. Has a walker, shower chair, bilateral prosthesis for his BKA, hoyer lift, bsc, ramp and electric and manual wheelchair. At home.  PCP is Marva Panda, NP and uses [facility] [employer] on [address].   PT recommended a RW as patient's walker does not accommodate his 6'5" height. CM called in order to [employer] and Barbara Cower informed CM that patient received a w/c, RW and cane within the last 3 yrs and insurance will not pay for another at this time. Advised patient to extend legs of walker he already has as it accomodates up to 6'7". Patient notified.  Outpatient PT recommended, patient states he was going to [facility] Outpatient rehab and will resume at discharge. Order and referral on AVS.  CM will continue to follow as patient progresses with care towards discharge.         Expected Discharge Plan: OP Rehab Barriers to Discharge: Continued Medical Work up   Patient Goals and CMS Choice Patient states their goals for this hospitalization and ongoing recovery are:: To return home CMS Medicare.gov Compare Post Acute Care list provided to:: Patient Choice offered to / list presented to : Patient, Spouse      Expected Discharge Plan and Services   Discharge Planning Services: CM Consult Post Acute Care Choice: NA (Outpatient) Living arrangements for  the past 2 months: Single Family Home                 DME Arranged: Walker tall (Bariatric) DME Agency: NA       HH Arranged: NA HH Agency: NA        Prior Living Arrangements/Services Living arrangements for the past 2 months: Single Family Home Lives with:: Spouse Patient language and need for interpreter reviewed:: Yes Do you feel safe going back to the place where you live?: Yes      Need for Family Participation in Patient Care: Yes (Comment) Care giver support system in place?: Yes (comment) Current home services: DME (Bilat prosthesis, bsc, walker, shower chair, electric and manual wheelchair, ramp.) Criminal Activity/Legal Involvement Pertinent to Current Situation/Hospitalization: No - Comment as needed  Activities of Daily Living Home Assistive Devices/Equipment: CBG Meter, Eyeglasses, Prosthesis, Shower chair with back, Wheelchair ADL Screening (condition at time of admission) Patient's cognitive ability adequate to safely complete daily activities?: Yes Is the patient deaf or have difficulty hearing?: No Does the patient have difficulty seeing, even when wearing glasses/contacts?: Yes Does the patient have difficulty concentrating, remembering, or making decisions?: No Patient able to express need for assistance with ADLs?: Yes Does the patient have difficulty dressing or bathing?: No Independently performs ADLs?: Yes (appropriate for developmental age) Does the patient have difficulty walking or climbing stairs?: Yes Weakness of Legs: Both (bil. BKA) Weakness of Arms/Hands: None  Permission Sought/Granted Permission sought to share information with : Case  Manager, Magazine features editor, Family Supports Permission granted to share information with : Yes, Verbal Permission Granted              Emotional Assessment Appearance:: Appears stated age Attitude/Demeanor/Rapport: Engaged, Gracious Affect (typically observed): Accepting, Appropriate, Calm,  Hopeful, Pleasant Orientation: : Oriented to Self, Oriented to Place, Oriented to  Time, Oriented to Situation Alcohol / Substance Use: Not Applicable Psych Involvement: No (comment)  Admission diagnosis:  AKI (acute kidney injury) (HCC) [N17.9] CHF (congestive heart failure) (HCC) [I50.9] Patient Active Problem List   Diagnosis Date Noted   Nephrosis 04/27/2023   CHF (congestive heart failure) (HCC) 04/26/2023   OSA (obstructive sleep apnea) 04/26/2023   Preoperative cardiovascular examination 04/25/2023   Hx of iron deficiency anemia 07/07/2022   Syncope and collapse 06/15/2022   AMS (altered mental status) 04/14/2022   Acute respiratory failure with hypoxia (HCC) 04/14/2022   Acute metabolic encephalopathy 04/14/2022   Pneumonia 04/14/2022   Ulcer of right foot, limited to breakdown of skin (HCC)    Below-knee amputation of left lower extremity (HCC)    Osteomyelitis of foot, right, acute (HCC) 01/06/2022   Bipolar disorder with depression (HCC)    Sepsis (HCC) 11/18/2021   Chest pain 11/18/2021   Dyspnea 11/18/2021   Wound infection    Ulcer of left foot with necrosis of bone (HCC)    Bicuspid aortic valve 09/14/2021   Aneurysm of ascending aorta (HCC) 09/14/2021   DM (diabetes mellitus) with peripheral vascular complication (HCC) 08/20/2021   Atypical chest pain 07/17/2021   Hyponatremia 07/17/2021   Leg wound, left 07/17/2021   Essential hypertension 07/17/2021   Acute hyperglycemia 07/09/2021   Non-ST elevation (NSTEMI) myocardial infarction (HCC) 07/22/2020   Heart failure with preserved ejection fraction (HCC) 07/22/2020   Hypotension 07/18/2020   Elevated troponin 07/18/2020   Acute renal failure superimposed on stage 3a chronic kidney disease (HCC) 07/18/2020   Elevated brain natriuretic peptide (BNP) level 07/18/2020   Unstable angina (HCC) 11/01/2019   Edema 06/13/2019   Leukocytosis 05/31/2019   Smoker 03/08/2019   Chronic back pain 02/16/2019   Deep  venous thrombosis (HCC) 02/16/2019   Obesity, Class III, BMI 40-49.9 (morbid obesity) (HCC) 02/16/2019   Peripheral nerve disease 02/16/2019   COPD (chronic obstructive pulmonary disease) (HCC) 01/22/2019   Anxiety and depression 03/26/2018   Sinusitis 10/12/2017   Abdominal pain    Loss of weight    Diabetic foot infection (HCC) 09/16/2017   Cellulitis of right foot    Recurrent chest pain 07/29/2016   Hyperglycemia    Insulin dependent type 2 diabetes mellitus (HCC) 04/29/2015   History of pulmonary embolus (PE) 04/27/2013   CAD (coronary artery disease) 09/10/2008   PCP:  Marva Panda, NP Pharmacy:   [id number] - [place], [place] - [address] AT [facility] OF [address] [place] [address] Phone: [phone] Fax: [phone]     Social Determinants of Health (SDOH) Social History: SDOH Screenings   Food Insecurity: No Food Insecurity (09/09/2022)  Housing: Medium Risk (09/09/2022)  Transportation Needs: Unmet Transportation Needs (09/09/2022)  Utilities: Not At Risk (09/09/2022)  Depression (PHQ2-9): Low Risk  (05/06/2021)  Financial Resource Strain: Medium Risk (09/09/2022)  Tobacco Use: Medium Risk (04/26/2023)   SDOH Interventions: Transportation Interventions: Intervention Not Indicated, Inpatient TOC, Patient Resources (Friends/Family)   Readmission Risk Interventions    11/24/2021    4:05 PM 08/20/2021   12:26 PM 07/12/2021  12:29 PM  Readmission Risk Prevention Plan  Transportation Screening Complete Complete Complete  Medication Review Oceanographer) Complete Complete Complete  PCP or Specialist appointment within 3-5 days of discharge Complete Complete Complete  HRI or Home Care Consult Complete Complete Complete  SW Recovery Care/Counseling Consult Complete Complete Complete  Palliative Care Screening Not Applicable Not Applicable Not Applicable  Skilled Nursing Facility Complete Not  Applicable Not Applicable

## 2023-04-27 NOTE — Progress Notes (Addendum)
Daily Progress Note Intern Pager: [phone]  Patient name: Joseph [name] Medical record number: [id number] Date of birth: May 11, 1971 Age: 52 y.o. Gender: male  Primary Care Provider: Marva Panda, NP Consultants: Cardiology, Nephrology Code Status: Full code  Pt Overview and Major Events to Date:  5/7: Admitted to FMTS 5/8: Nephrology consulted  Assessment and Plan: Joseph [name] is a 52 y.o. male presenting with hypervolemia in the setting of HF exacerbation with concurrent AKI on CKD.  PMHx includes T2DM, bilateral BKA d/t diabetic foot, HLD, CAD s/p CABG, PE, CHF, PAD, aortic aneurysm, COPD, anxiety, GERD, HTN, and OSA.  * Heart failure with preserved ejection fraction (HCC) ~3L UOP since admission. Diuresing well on IV Lasix regimen. Will consult Cardiology given sent from their office outpatient. -Cardiology consulted, appreciate recs -Diuresis with IV Lasix 80mg  BID -Echo pending -BMP BID -Cardiac monitoring -Continue home Coreg 12.5mg  BID  Acute renal failure superimposed on stage 3a chronic kidney disease (HCC) Cr stable from prior day, ~1.7. H/o CKD 3a at baseline (Cr 1.3-1.4). -Given nephrotic range proteinuria, will ask Nephrology to see. Appreciate their input.  -Avoid nephrotoxic agents  History of pulmonary embolus (PE) Per chart review patient taken off Xarelto in favor of DAPT per wife. -Discuss with Cardiology given history of CABG  Nephrosis PC ratio elevated to 5.39, consistent with Nephrotic syndrome. Will consulted Nephrology for further work up and evaluation. Likely diabetic nephrosis but concern for additional pathology. -Nephrology consulted, appreciate recs  Below-knee amputation of left lower extremity (HCC) Evaluated by wound care and received wound management. Continue to follow ulceration on L BKA site.  Insulin dependent type 2 diabetes mellitus (HCC) BGL in 200s. A1c 7.4 -Increase to Semglee 20U BID -mSSI with CBG  checks  Hyperkalemia-resolved as of 04/27/2023 Resolved, K 4.7   Chronic stable conditions: Anxiety: continue Xanax 2mg  daily, Bupriprion 150mg  daily, Escitalopram 10mg  daily HLD: continue Atorvastatin 80mg  daily PAD: continue ASA and Plavix GERD: continue pantoprazole 40mg   Neuropathy: continue Pregabalin 150mg  TID OSA: CPAP nightly HTN: continue Amlodipine Unstable angina: continue Ranolazine 150mg  BID   FEN/GI: Heart healthy VTE Prophylaxis: Lovenox Dispo: Home pending diuresis and Nephro evaluation  Subjective:  Patient assessed at bedside, aunt present. States he has been urinating a lot. Still reports feeling puffy. Denies CP or SOB.  Objective: Temp:  [97.4 F (36.3 C)-98.2 F (36.8 C)] 97.5 F (36.4 C) (05/08 0521) Pulse Rate:  [81-87] 82 (05/08 0847) Resp:  [9-19] 16 (05/08 0521) BP: (105-136)/(83-101) 125/83 (05/08 0847) SpO2:  [95 %-97 %] 95 % (05/08 0521) Weight:  [176.1 kg] 176.1 kg (05/07 1534) Physical Exam: General: Sitting up in bed. Alert. NAD CV: RRR without murmur Pulm: CTAB. Normal WOB on RA. No wheezing Abdomen: Soft, obese, non-tender Ext: Notable non-pitting edema of upper and lower extremities, mild improvement from prior day  Laboratory: Most recent CBC Lab Results  Component Value Date   WBC 6.7 04/26/2023   HGB 12.4 (L) 04/26/2023   HCT 40.9 04/26/2023   MCV 79.9 (L) 04/26/2023   PLT 261 04/26/2023   Most recent BMP    Latest Ref Rng & Units 04/27/2023    1:59 AM  BMP  Glucose 70 - 99 mg/dL 147   BUN 6 - 20 mg/dL 38   Creatinine 8.29 - 1.24 mg/dL 5.62   Sodium 130 - 865 mmol/L 136   Potassium 3.5 - 5.1 mmol/L 4.7   Chloride 98 - 111 mmol/L 101   CO2 22 -  32 mmol/L 28   Calcium 8.9 - 10.3 mg/dL 8.3     Other pertinent labs: PC ratio: 5.89 Ferritin: 15 Iron: 38  Imaging/Diagnostic Tests: DG Chest 2 View Result Date: 04/26/2023 IMPRESSION: No acute cardiopulmonary findings.   [name], MD 04/27/2023, 12:53  PM  PGY-1, [facility] FPTS Intern pager: [phone], text pages welcome Secure chat group [facility] Teaching Service

## 2023-04-27 NOTE — Hospital Course (Addendum)
Joseph [name] is a 52 y.o.male with a history of T2DM, bilateral BKA d/t diabetic foot, HLD, CAD s/p CABG, PE, CHF, PAD, aortic aneurysm, COPD, anxiety, GERD, HTN, and OSA who was admitted to the [facility] Medicine Teaching Service at [facility] for hypervolemia. His hospital course is detailed below:  Hypervolemia HF exacerbation Nephrotic syndrome Admitted with global hypervolemia or non-pitting edema in UE and LE for unknown period of time. Patient diuresed with IV Lasix 80mg  BID with good UOP. PC ratio elevated to 5.89, concern for nephrotic syndrome.  H/o multiple PEs Patient reports two prior Pes, possibly unprovoked but unclear from documentation and patient reporting. Cardiologist recommended outpatient hematology follow up to discuss anticoagulation.  T2DM with bilateral BKA Superficial ulceration on L BKA site, patient received inpatient wound care with improvement ***. Patient insulin regimen titrated to LAI 25U BID and mSSI.   Other chronic conditions were medically managed with home medications and formulary alternatives as necessary (anxiety, HLD, PAD, GERD, neuropathy, OSA, HTN, angina)  PCP Follow-up Recommendations:  Outpatient heme follow up to discuss need for anticoagulation Encourage outpatient nephrology follow up

## 2023-04-27 NOTE — Consult Note (Signed)
Nephrology Consult   Requesting provider: Pearlean Brownie Service requesting consult: Family Medicine Reason for consult: Proteinuria   Assessment/Recommendations: Joseph [name] is a/an 52 y.o. male with a past medical history CAD, PAD status post bilateral BKA, morbid obesity, DM2, PE, HFpEF COPD who presents with heart failure exacerbation found to have proteinuria  Proteinuria: UPC found to be 5.9.  Interesting urinalysis in the past has not shown more significant proteinuria however will repeat this test while he is here.  Most likely cause of his proteinuria is diabetes.  He has had poorly controlled diabetes in the past and this is most common cause of proteinuria.  Does not obviously have nephrotic syndrome based on my assessment however his albumin has been moderately low in the past and we will need to repeat this this admission.  Low albumin can also be caused by chronic illness.  The most likely best next step is to continue with diuresis and ultimately reevaluate in the outpatient setting.  I will also obtain a urinalysis and UACR.  Because of extensive proteinuria at this time with more limited proteinuria on previous urinalysis I will also obtain an SPEP and serum free light chains to ensure there is no degree of Bence-Jones proteinuria.  CKD 3A: Baseline creatinine seems to be around 1.5 with current value not far from baseline.  Possibly has some degree of cardiorenal syndrome -Continue with diuresis as below -Continue to monitor daily Cr, Dose meds for GFR -Monitor Daily I/Os, Daily weight  -Maintain MAP>65 for optimal renal perfusion.  -Avoid nephrotoxic medications including NSAIDs -Use synthetic opioids (Fentanyl/Dilaudid) if needed -Currently no indication for HD  CHF exacerbation: Seems to be responding to current Lasix dose.  Continue with diuresis as prescribed.  Will continue to monitor his progress.  He is on several culprit medications for including amlodipine and  Lyrica.  May want to consider alternate options  Hypertension: On amlodipine 10 mg daily and carvedilol 12.5 mg twice daily.  May consider decreasing his amlodipine if blood pressure drops with diuresis.  In the future we may want to consider an ARB because of his proteinuria  Uncontrolled Diabetes Mellitus Type 2 with Hyperglycemia: Management per primary team   Recommendations conveyed to primary service.    Darnell Level [facility] 04/27/2023 1:24 PM   _____________________________________________________________________________________ CC: Volume overload  History of Present Illness: Joseph [name] is a/an 52 y.o. male with a past medical history of CAD, PAD status post bilateral BKA, morbid obesity, DM2, PE, HFpEF COPD who presents with heart failure exacerbation.  The patient states that he has generally felt about the same for a while before coming to the hospital.  However, he does note that he has had worsening volume overload for quite some time.  Feels like the diuretics he is taking at home have not been quite as helpful.  Has noticed some shortness of breath as well.  States he has been compliant with his outpatient medications.  He saw cardiology the day before coming and and because of his overall clinical situation was recommended he come into the emergency department for further evaluation.  In the emergency department his creatinine was found to be slightly above his baseline around 1.8.  He also had some elevation in potassium up to 5.9.  He was started on diuretics and today he already notices a significant difference with good urine output over the past 24 hours.  Creatinine was 1.7 today.  Hard to define a baseline definitively  but likely around 1.5.  Urine protein creatinine ratio was also obtained and found to be 5.9.  Interestingly his urinalysis demonstrated some proteinuria but read as 100 and not greater than 300.  He denies any NSAID use.  No  significant rashes.  No significant hematuria.   Medications:  Current Facility-Administered Medications  Medication Dose Route Frequency Provider Last Rate Last Admin   0.9 %  sodium chloride infusion  250 mL Intravenous PRN Alfredo Martinez, MD       acetaminophen (TYLENOL) tablet 650 mg  650 mg Oral Q4H PRN Jena Gauss, Allee, MD       albuterol (PROVENTIL) (2.5 MG/3ML) 0.083% nebulizer solution 2.5 mg  2.5 mg Nebulization Q6H PRN Rejeana Brock, MD       ALPRAZolam Prudy Feeler) tablet 1-2 mg  1-2 mg Oral QHS PRN Alfredo Martinez, MD   2 mg at 04/26/23 2044   amLODipine (NORVASC) tablet 10 mg  10 mg Oral Daily Alfredo Martinez, MD   10 mg at 04/27/23 1610   aspirin EC tablet 81 mg  81 mg Oral q AM Alfredo Martinez, MD   81 mg at 04/27/23 0851   atorvastatin (LIPITOR) tablet 80 mg  80 mg Oral q1800 Alfredo Martinez, MD   80 mg at 04/26/23 2244   buPROPion (WELLBUTRIN XL) 24 hr tablet 150 mg  150 mg Oral Daily Jena Gauss, Allee, MD   150 mg at 04/27/23 0852   carvedilol (COREG) tablet 12.5 mg  12.5 mg Oral BID WC Jena Gauss, Allee, MD   12.5 mg at 04/27/23 9604   clopidogrel (PLAVIX) tablet 75 mg  75 mg Oral Q breakfast Alfredo Martinez, MD   75 mg at 04/27/23 0852   enoxaparin (LOVENOX) injection 80 mg  80 mg Subcutaneous Q24H Maxwell, Allee, MD       escitalopram (LEXAPRO) tablet 10 mg  10 mg Oral q AM Jena Gauss, Allee, MD   10 mg at 04/27/23 0851   furosemide (LASIX) injection 80 mg  80 mg Intravenous BID Alfredo Martinez, MD   80 mg at 04/27/23 5409   icosapent Ethyl (VASCEPA) 1 g capsule 2 g  2 g Oral BID Alfredo Martinez, MD   2 g at 04/27/23 0851   insulin aspart (novoLOG) injection 0-15 Units  0-15 Units Subcutaneous TID WC Maxwell, Allee, MD   5 Units at 04/27/23 1206   insulin glargine-yfgn (SEMGLEE) injection 20 Units  20 Units Subcutaneous BID Alfredo Martinez, MD       oxyCODONE (Oxy IR/ROXICODONE) immediate release tablet 15 mg  15 mg Oral Q4H PRN Alfredo Martinez, MD   15 mg at 04/27/23 1323   pantoprazole  (PROTONIX) EC tablet 40 mg  40 mg Oral Daily Alfredo Martinez, MD   40 mg at 04/27/23 8119   prednisoLONE acetate (PRED FORTE) 1 % ophthalmic suspension 1 drop  1 drop Left Eye QID Alfredo Martinez, MD   1 drop at 04/27/23 0854   pregabalin (LYRICA) capsule 150 mg  150 mg Oral QHS Maxwell, Allee, MD   150 mg at 04/26/23 2244   pregabalin (LYRICA) capsule 300 mg  300 mg Oral Daily Jena Gauss, Allee, MD   300 mg at 04/27/23 0851   ranolazine (RANEXA) 12 hr tablet 500 mg  500 mg Oral BID Alfredo Martinez, MD   500 mg at 04/27/23 0852   sodium chloride flush (NS) 0.9 % injection 3 mL  3 mL Intravenous Q12H Alfredo Martinez, MD   3 mL at 04/27/23 0854   sodium chloride  flush (NS) 0.9 % injection 3 mL  3 mL Intravenous PRN Alfredo Martinez, MD         ALLERGIES Sulfa antibiotics  MEDICAL HISTORY Past Medical History:  Diagnosis Date   Aneurysm of ascending aorta (HCC) 09/14/2021   Anxiety    Arthritis    "knees, shoulders, hips, ankles" (07/29/2016)   Asthma    Bicuspid aortic valve 09/14/2021   CAD (coronary artery disease)    a. 2017: s/p BMS to distal Cx; b. LHC 05/30/2019: 80% mid, distal RCA s/p DES, 30% narrowing of d LM, widely patent LAD w/ luminal irregularities, widely patent stent in dCX  w/ 90+% stenosis distal to stent beofre small trifurcating obtuse marginal (potentially area of restenosis)   Chronic bronchitis (HCC)    Chronic ulcer of great toe of right foot (HCC) 09/09/2017   Chronic ulcer of right great toe (HCC) 09/19/2017   COPD (chronic obstructive pulmonary disease) (HCC)    Coronary arteriosclerosis    Depression    Diabetes mellitus (HCC)    DVT (deep venous thrombosis) (HCC)    GERD (gastroesophageal reflux disease)    Hyperlipidemia    Hypertension    Migraine    Morbid obesity (HCC)    Myocardial infarction (HCC) 1996   "light one"   PE (pulmonary embolism) 04/2013   On chronic Xarelto   Peripheral nerve disease    Pneumonia "several times"   Sleep apnea    Type II  diabetes mellitus (HCC)      SOCIAL HISTORY Social History   Socioeconomic History   Marital status: Married    Spouse name: Not on file   Number of children: Not on file   Years of education: Not on file   Highest education level: Not on file  Occupational History   Occupation: disabled     Employer: UNEMPLOYED  Tobacco Use   Smoking status: Former    Packs/day: 1.00    Years: 34.00    Additional pack years: 0.00    Total pack years: 34.00    Types: Cigarettes    Quit date: 07/12/2020    Years since quitting: 2.7   Smokeless tobacco: Former    Types: Snuff, Chew  Vaping Use   Vaping Use: Never used  Substance and Sexual Activity   Alcohol use: Yes    Comment: RARE   Drug use: No   Sexual activity: Yes  Other Topics Concern   Not on file  Social History Narrative   Not on file   Social Determinants of Health   Financial Resource Strain: Medium Risk (09/09/2022)   Overall Financial Resource Strain (CARDIA)    Difficulty of Paying Living Expenses: Somewhat hard  Food Insecurity: No Food Insecurity (09/09/2022)   Hunger Vital Sign    Worried About Running Out of Food in the Last Year: Never true    Ran Out of Food in the Last Year: Never true  Transportation Needs: Unmet Transportation Needs (09/09/2022)   PRAPARE - Administrator, Civil Service (Medical): Yes    Lack of Transportation (Non-Medical): Yes  Physical Activity: Not on file  Stress: Not on file  Social Connections: Not on file  Intimate Partner Violence: Not on file     FAMILY HISTORY Family History  Problem Relation Age of Onset   Hypertension Brother    Hypertension Father    Diabetes Other    Hyperlipidemia Other       Review of Systems: 12 systems reviewed Otherwise  as per HPI, all other systems reviewed and negative  Physical Exam: Vitals:   04/27/23 0521 04/27/23 0847  BP: 121/84 125/83  Pulse: 83 82  Resp: 16   Temp: (!) 97.5 F (36.4 C)   SpO2: 95%    Total  I/O In: 300 [P.O.:300] Out: 2150 [Urine:2150]  Intake/Output Summary (Last 24 hours) at 04/27/2023 1324 Last data filed at 04/27/2023 1323 Gross per 24 hour  Intake 800 ml  Output 4550 ml  Net -3750 ml   General: Obese, well-appearing, no acute distress HEENT: anicteric sclera, oropharynx clear without lesions CV: Normal rate, no rub, 2+ edema in bilateral stumps Lungs: Clear to auscultation anteriorly, normal work of breathing Abd: soft, non-tender, non-distended Skin: no visible lesions or rashes Psych: alert, engaged, appropriate mood and affect Musculoskeletal: Bilateral extremities absent below the knee, otherwise no obvious deformities Neuro: normal speech, no gross focal deficits   Test Results Reviewed Lab Results  Component Value Date   NA 136 04/27/2023   K 4.7 04/27/2023   CL 101 04/27/2023   CO2 28 04/27/2023   BUN 38 (H) 04/27/2023   CREATININE 1.70 (H) 04/27/2023   GFR 78.79 05/28/2015   CALCIUM 8.3 (L) 04/27/2023   ALBUMIN 2.2 (L) 07/04/2022   PHOS 2.4 (L) 07/04/2022    CBC Recent Labs  Lab 04/26/23 1212  WBC 6.7  HGB 12.4*  HCT 40.9  MCV 79.9*  PLT 261    I have reviewed all relevant outside healthcare records related to the patient's current hospitalization

## 2023-04-28 DIAGNOSIS — R531 Weakness: Secondary | ICD-10-CM | POA: Diagnosis not present

## 2023-04-28 LAB — URINALYSIS, ROUTINE W REFLEX MICROSCOPIC
Bacteria, UA: NONE SEEN
Bilirubin Urine: NEGATIVE
Glucose, UA: 50 mg/dL — AB
Hgb urine dipstick: NEGATIVE
Ketones, ur: NEGATIVE mg/dL
Leukocytes,Ua: NEGATIVE
Nitrite: NEGATIVE
Protein, ur: >=300 mg/dL — AB
Specific Gravity, Urine: 1.009 (ref 1.005–1.030)
pH: 5 (ref 5.0–8.0)

## 2023-04-28 LAB — BASIC METABOLIC PANEL
Anion gap: 9 (ref 5–15)
Anion gap: 7 (ref 5–15)
BUN: 41 mg/dL — ABNORMAL HIGH (ref 6–20)
BUN: 45 mg/dL — ABNORMAL HIGH (ref 6–20)
CO2: 26 mmol/L (ref 22–32)
CO2: 26 mmol/L (ref 22–32)
Calcium: 8.7 mg/dL — ABNORMAL LOW (ref 8.9–10.3)
Calcium: 8.3 mg/dL — ABNORMAL LOW (ref 8.9–10.3)
Chloride: 101 mmol/L (ref 98–111)
Chloride: 102 mmol/L (ref 98–111)
Creatinine, Ser: 1.75 mg/dL — ABNORMAL HIGH (ref 0.61–1.24)
Creatinine, Ser: 1.76 mg/dL — ABNORMAL HIGH (ref 0.61–1.24)
GFR, Estimated: 47 mL/min — ABNORMAL LOW (ref >60)
GFR, Estimated: 47 mL/min — ABNORMAL LOW (ref >60)
Glucose, Bld: 210 mg/dL — ABNORMAL HIGH (ref 70–99)
Glucose, Bld: 246 mg/dL — ABNORMAL HIGH (ref 70–99)
Potassium: 5 mmol/L (ref 3.5–5.1)
Potassium: 4.7 mmol/L (ref 3.5–5.1)
Sodium: 136 mmol/L (ref 135–145)
Sodium: 135 mmol/L (ref 135–145)

## 2023-04-28 LAB — KAPPA/LAMBDA LIGHT CHAINS
Kappa free light chain: 62.9 mg/L — ABNORMAL HIGH (ref 3.3–19.4)
Kappa, lambda light chain ratio: 2.13 — ABNORMAL HIGH (ref 0.26–1.65)
Lambda free light chains: 29.6 mg/L — ABNORMAL HIGH (ref 5.7–26.3)

## 2023-04-28 LAB — GLUCOSE, CAPILLARY
Glucose-Capillary: 201 mg/dL — ABNORMAL HIGH (ref 70–99)
Glucose-Capillary: 270 mg/dL — ABNORMAL HIGH (ref 70–99)
Glucose-Capillary: 230 mg/dL — ABNORMAL HIGH (ref 70–99)
Glucose-Capillary: 286 mg/dL — ABNORMAL HIGH (ref 70–99)

## 2023-04-28 MED ORDER — INSULIN GLARGINE-YFGN 100 UNIT/ML ~~LOC~~ SOLN
25.0000 [IU] | Freq: Two times a day (BID) | SUBCUTANEOUS | Status: DC
  Administered 2023-04-28 – 2023-04-29 (×2): 25 [IU] via SUBCUTANEOUS
  Filled 2023-04-28 (×3): qty 0.25

## 2023-04-28 MED ORDER — POLYETHYLENE GLYCOL 3350 17 G PO PACK
17.0000 g | PACK | Freq: Every day | ORAL | Status: DC
  Filled 2023-04-28 (×5): qty 1

## 2023-04-28 MED ORDER — FUROSEMIDE 40 MG PO TABS
80.0000 mg | ORAL_TABLET | Freq: Once | ORAL | Status: AC
  Administered 2023-04-28: 80 mg via ORAL
  Filled 2023-04-28 (×2): qty 2

## 2023-04-28 NOTE — Inpatient Diabetes Management (Signed)
Inpatient Diabetes Program Recommendations  AACE/ADA: New Consensus Statement on Inpatient Glycemic Control (2015)  Target Ranges:  Prepandial:   less than 140 mg/dL      Peak postprandial:   less than 180 mg/dL (1-2 hours)      Critically ill patients:  140 - 180 mg/dL   Lab Results  Component Value Date   GLUCAP 201 (H) 04/28/2023   HGBA1C 7.4 (H) 04/26/2023    Review of Glycemic Control  Latest Reference Range & Units 04/27/23 07:26 04/27/23 11:37 04/27/23 16:27 04/27/23 22:18 04/28/23 07:29  Glucose-Capillary 70 - 99 mg/dL 161 (H) 096 (H) 045 (H) 287 (H) 201 (H)   Diabetes history: DM 2 Outpatient Diabetes medications: Lantus 20 units bid, Novolog 3 units tid, Metformin 1000 mg bid  Current orders for Inpatient glycemic control:  Semglee 20 units bid Novolog 0-15 units tid + hs  A1c 7.4% on 5/7  Inpatient Diabetes Program Recommendations:    -  Consider adding Novolog 3 units tid meal coverage if eating >50% of meals  Thanks,  Christena Deem RN, MSN, BC-ADM Inpatient Diabetes Coordinator Team Pager [phone] (8a-5p)

## 2023-04-28 NOTE — Progress Notes (Signed)
Daily Progress Note Intern Pager: [phone]  Patient name: Joseph [name] Medical record number: [id number] Date of birth: [birth year] Age: 52 y.o. Gender: male  Primary Care Provider: Marva Panda, NP Consultants: Cardiology, Nephrology Code Status: Full code   Pt Overview and Major Events to Date:  5/7: Admitted to FMTS 5/8: Nephrology consulted   Assessment and Plan: Joseph [name] is a 52 y.o. male presenting with hypervolemia in the setting of HF exacerbation with concurrent AKI on CKD.  PMHx includes T2DM, bilateral BKA d/t diabetic foot, HLD, CAD s/p CABG, PE, CHF, PAD, aortic aneurysm, COPD, anxiety, GERD, HTN, and OSA.  * Heart failure with preserved ejection fraction (HCC) ~3.7L UOP yesterday. Outpatient Cardiologist aware and agree with management. Echo with EF 55-60%, appears consistent with prior. Will transition patient to oral diuretic and plan to discharge tomorrow if good response. -Give Lasix 80mg  PO this evening, if effective diuresis will start on PO Lasix 80mg  BID  -BMP BID -Cardiac monitoring -Continue home Coreg 12.5mg  BID  Acute renal failure superimposed on stage 3a chronic kidney disease (HCC) Kidney function unchanged from previous day. H/o CKD 3a at baseline (Cr 1.3-1.4). -Nephrology following -Avoid nephrotoxic agents  History of pulmonary embolus (PE) Per Cardiology, patient likely needs outpatient hematology follow up to discuss anticoagulation for multiple prior PEs. Currently on DAPT therapy given h/o CABG  Nephrosis Nephrology following for nephrotic range proteinuria. Likely diabetic in origin but will continue to follow up urine studies. Possible to continue work up outpatient per Nephro. -Nephrology consulted, appreciate recs -F/u repeat UA and UPCR -Pending SPEP and light chain studies  Below-knee amputation of left lower extremity (HCC) Stale wound, continue management per wound care team.  Insulin dependent type 2 diabetes  mellitus (HCC) BGL continues to be in 200s. -Increase to Semglee 25U BID -mSSI with CBG checks   Chronic stable conditions: Anxiety: continue Xanax 2mg  daily, Bupriprion 150mg  daily, Escitalopram 10mg  daily HLD: continue Atorvastatin 80mg  daily PAD: continue ASA and Plavix GERD: continue pantoprazole 40mg   Neuropathy: continue Pregabalin 150mg  TID OSA: CPAP nightly HTN: continue Amlodipine Unstable angina: continue Ranolazine 150mg  BID   FEN/GI: Heart healthy VTE Prophylaxis: Lovenox Dispo: Home pending diuresis and Nephro evaluation  Subjective:  Patient assessed at bedside, reports feeling about the same from prior day. Still feeling "puffy". Wore his CPAP overnight, needs home machine. No BM for about 4 days prior to admission until now. Unsure why he is not on Xarelto, reports 2 prior unprovoked PEs.  Objective: Temp:  [97.5 F (36.4 C)-98.3 F (36.8 C)] 97.7 F (36.5 C) (05/09 0930) Pulse Rate:  [77-84] 80 (05/09 0930) Resp:  [16-19] 18 (05/09 0930) BP: (119-129)/(61-85) 119/61 (05/09 0930) SpO2:  [92 %-100 %] 98 % (05/09 0930) Weight:  [179.6 kg] 179.6 kg (05/09 0531) Physical Exam: General: Shirtless, sitting up in bed. Alert. NAD CV: RRR without murmur Pulm: CTAB. Normal WOB on RA. No wheezing Abdomen: Soft, obese, non-tender Ext: Non-pitting edema of upper and lower extremities, mild improvement from prior day  Laboratory: Most recent CBC Lab Results  Component Value Date   WBC 6.7 04/26/2023   HGB 12.4 (L) 04/26/2023   HCT 40.9 04/26/2023   MCV 79.9 (L) 04/26/2023   PLT 261 04/26/2023   Most recent BMP    Latest Ref Rng & Units 04/28/2023   12:35 AM  BMP  Glucose 70 - 99 mg/dL 696   BUN 6 - 20 mg/dL 41   Creatinine 2.95 - 1.24  mg/dL 1.61   Sodium 096 - 045 mmol/L 136   Potassium 3.5 - 5.1 mmol/L 5.0   Chloride 98 - 111 mmol/L 101   CO2 22 - 32 mmol/L 26   Calcium 8.9 - 10.3 mg/dL 8.7     Other pertinent labs: Glu: 201  Imaging/Diagnostic  Tests: ECHOCARDIOGRAM COMPLETE Result Date: 04/27/2023 IMPRESSIONS  1. Left ventricular ejection fraction, by estimation, is 55 to 60%. The left ventricle has normal function. The left ventricle has no regional wall motion abnormalities. There is mild concentric left ventricular hypertrophy. Left ventricular diastolic parameters are consistent with Grade II diastolic dysfunction (pseudonormalization).  2. Right ventricular systolic function is normal. The right ventricular size is normal. Tricuspid regurgitation signal is inadequate for assessing PA pressure.  3. The mitral valve is normal in structure. No evidence of mitral valve regurgitation. No evidence of mitral stenosis.  4. The aortic valve appears functionally bicuspid with fused left and right coronary cusps. There is moderate calcification of the aortic valve. Aortic valve regurgitation is not visualized. Aortic valve sclerosis/calcification is present, without any evidence of aortic stenosis. Aortic valve mean gradient measures 9.0 mmHg.  5. Aortic dilatation noted. There is moderate dilatation of the aortic root, measuring 44 mm. There is moderate dilatation of the ascending aorta, measuring 45 mm.  6. The inferior vena cava is dilated in size with >50% respiratory variability, suggesting right atrial pressure of 8 mmHg.  Joseph Fortis, MD 04/28/2023, 11:53 AM  PGY-1, [facility] FPTS Intern pager: [phone], text pages welcome Secure chat group [facility] Teaching Service

## 2023-04-28 NOTE — Plan of Care (Signed)
  Problem: Education: Goal: Ability to describe self-care measures that may prevent or decrease complications (Diabetes Survival Skills Education) will improve Outcome: Progressing Goal: Individualized Educational Video(s) Outcome: Progressing   Problem: Coping: Goal: Ability to adjust to condition or change in health will improve Outcome: Progressing   Problem: Fluid Volume: Goal: Ability to maintain a balanced intake and output will improve Outcome: Progressing   Problem: Health Behavior/Discharge Planning: Goal: Ability to identify and utilize available resources and services will improve Outcome: Progressing Goal: Ability to manage health-related needs will improve Outcome: Progressing   Problem: Metabolic: Goal: Ability to maintain appropriate glucose levels will improve Outcome: Progressing   Problem: Nutritional: Goal: Maintenance of adequate nutrition will improve Outcome: Progressing Goal: Progress toward achieving an optimal weight will improve Outcome: Progressing   Problem: Skin Integrity: Goal: Risk for impaired skin integrity will decrease Outcome: Progressing   Problem: Tissue Perfusion: Goal: Adequacy of tissue perfusion will improve Outcome: Progressing   Problem: Education: Goal: Ability to demonstrate management of disease process will improve Outcome: Progressing Goal: Ability to verbalize understanding of medication therapies will improve Outcome: Progressing Goal: Individualized Educational Video(s) Outcome: Progressing   Problem: Activity: Goal: Capacity to carry out activities will improve Outcome: Progressing   Problem: Cardiac: Goal: Ability to achieve and maintain adequate cardiopulmonary perfusion will improve Outcome: Progressing   Problem: Education: Goal: Knowledge of General Education information will improve Description: Including pain rating scale, medication(s)/side effects and non-pharmacologic comfort measures Outcome:  Progressing   Problem: Health Behavior/Discharge Planning: Goal: Ability to manage health-related needs will improve Outcome: Progressing   Problem: Clinical Measurements: Goal: Ability to maintain clinical measurements within normal limits will improve Outcome: Progressing Goal: Will remain free from infection Outcome: Progressing Goal: Diagnostic test results will improve Outcome: Progressing Goal: Respiratory complications will improve Outcome: Progressing Goal: Cardiovascular complication will be avoided Outcome: Progressing   Problem: Activity: Goal: Risk for activity intolerance will decrease Outcome: Progressing   Problem: Nutrition: Goal: Adequate nutrition will be maintained Outcome: Progressing   Problem: Coping: Goal: Level of anxiety will decrease Outcome: Progressing   Problem: Elimination: Goal: Will not experience complications related to bowel motility Outcome: Progressing Goal: Will not experience complications related to urinary retention Outcome: Progressing   Problem: Pain Managment: Goal: General experience of comfort will improve Outcome: Progressing   Problem: Safety: Goal: Ability to remain free from injury will improve Outcome: Progressing   Problem: Skin Integrity: Goal: Risk for impaired skin integrity will decrease Outcome: Progressing   

## 2023-04-28 NOTE — Progress Notes (Signed)
Occupational Therapy Treatment Patient Details Name: Joseph [name] MRN: [id number] DOB: [birth year] Today's Date: 04/28/2023   History of present illness Pt is a 52 y/o male who presents 04/26/23 per his cardiologist's request 2 abnormal labs and volume overload. PMH signficant for AAA aneurysm, anxiety, asthma, CAD, COPD, DM, DVT, HNT, migraine, morbid obesity, MI, PE, peripheral nerve disease, B BKA with prosthetics, CABG, bilateral shoulder open rotator cuff repair.   OT comments  Pt sitting up in bed upon OT arrival. Pt agreeable in participation in skilled OT session. OT educated pt in techniques for improved stress and self-behavior management techniques with pt verbalizing understanding and demonstrating through teach back ability to Independently state four stress management techniques for improved self-regulaiton and increased quality of life. Pt will benefit form continued acute skilled OT services to address all steps of toileting task utilizing drop arm bedside commode to increase safety and independence. Discharge plan remains appropriate.    Recommendations for follow up therapy are one component of a multi-disciplinary discharge planning process, led by the attending physician.  Recommendations may be updated based on patient status, additional functional criteria and insurance authorization.    Assistance Recommended at Discharge PRN  Patient can return home with the following  Assistance with cooking/housework;Assist for transportation;Help with stairs or ramp for entrance;A little help with bathing/dressing/bathroom   Equipment Recommendations  None recommended by OT    Recommendations for Other Services      Precautions / Restrictions Precautions Precautions: Fall Required Braces or Orthoses: Other Brace Other Brace: Prosthetics, not present in room on eval Restrictions Weight Bearing Restrictions: Yes LLE Weight Bearing: Non weight bearing Other Position/Activity  Restrictions: Pt reports MD wants him to refrain from using prosthetic due to wound at this time.       Mobility Bed Mobility                    Transfers                         Balance                                           ADL either performed or assessed with clinical judgement   ADL                                              Extremity/Trunk Assessment              Vision       Perception     Praxis      Cognition Arousal/Alertness: Awake/alert Behavior During Therapy: WFL for tasks assessed/performed Overall Cognitive Status: Within Functional Limits for tasks assessed                                          Exercises      Shoulder Instructions       General Comments OT instructed pt in stress management and self-behavior management techniques to improve self-regulation and improve quality of life. Pt verbalized understanding and demonstrated understanding through teach back with ability to Independently state four techniques (journaling, taking a  break, box breathing, mindfulness techniques).    Pertinent Vitals/ Pain       Pain Assessment Pain Assessment: Faces Faces Pain Scale: Hurts little more Pain Location: R shoulder, L residual limb Pain Descriptors / Indicators: Discomfort, Grimacing, Guarding, Sore Pain Intervention(s): Limited activity within patient's tolerance, Monitored during session, Repositioned  Home Living                                          Prior Functioning/Environment              Frequency           Progress Toward Goals  OT Goals(current goals can now be found in the care plan section)  Progress towards OT goals: Progressing toward goals (Met goal regarding stress and self-behavior management.)  Acute Rehab OT Goals Patient Stated Goal: To practice stress management techniques OT Goal Formulation: With  patient  Plan Discharge plan remains appropriate    Co-evaluation                 AM-PAC OT "6 Clicks" Daily Activity     Outcome Measure   Help from another person eating meals?: None Help from another person taking care of personal grooming?: None Help from another person toileting, which includes using toliet, bedpan, or urinal?: A Little Help from another person bathing (including washing, rinsing, drying)?: None Help from another person to put on and taking off regular upper body clothing?: None Help from another person to put on and taking off regular lower body clothing?: None 6 Click Score: 23    End of Session    OT Visit Diagnosis: Other (comment) (Other involving cognitive behavioral function)   Activity Tolerance Patient tolerated treatment well   Patient Left in bed;with call bell/phone within reach;with family/visitor present   Nurse Communication Mobility status        Time: 1610-9604 OT Time Calculation (min): 24 min  Charges: OT General Charges $OT Visit: 1 Visit OT Treatments $Therapeutic Activity: 23-37 mins  Taralynn Quiett "Orson Eva., OTR/L, MA Acute Rehab [phone]   Lendon Colonel 04/28/2023, 1:48 PM

## 2023-04-28 NOTE — Progress Notes (Signed)
Nephrology Follow-Up Consult note   Assessment/Recommendations: Joseph [name] is a/an 52 y.o. male with a past medical history significant for CAD, PAD status post bilateral BKA, morbid obesity, DM2, PE, HFpEF COPD who presents with heart failure exacerbation found to have proteinuria   Proteinuria: UPC found to be 5.9.  Most likely diabetic kidney disease but obtaining UACR and urinalysis here to ensure there is not discrepancy between UPC and UACR which could sometimes indicate Bence-Jones proteinuria.  Also obtaining SPEP and serum free light chains.  Can most likely be worked up further in the outpatient setting   CKD 3A: Baseline creatinine seems to be around 1.5 with current value not far from baseline.  Possibly has some degree of cardiorenal syndrome.  Creatinine overall stable.  Not surprised if it goes up as we optimize his volume status -Continue with diuresis as below -Continue to monitor daily Cr, Dose meds for GFR -Monitor Daily I/Os, Daily weight  -Maintain MAP>65 for optimal renal perfusion.  -Avoid nephrotoxic medications including NSAIDs -Use synthetic opioids (Fentanyl/Dilaudid) if needed -Currently no indication for HD   CHF exacerbation: Diuresing very well on the Lasix.  Continues to be volume overloaded.  Continue with diuresis per primary team/cardiology.  He is on several culprit medications for edema including amlodipine and Lyrica.  May want to consider alternate options   Hypertension: On amlodipine 10 mg daily and carvedilol 12.5 mg twice daily.  May consider decreasing his amlodipine if blood pressure drops with diuresis.  In the future we may want to consider an ARB because of his proteinuria   Uncontrolled Diabetes Mellitus Type 2 with Hyperglycemia: Management per primary team   Recommendations conveyed to primary service.    Darnell Level [facility] 04/28/2023 9:37  AM  ___________________________________________________________  CC: Volume overload  Interval History/Subjective: Patient tired but overall feels about the same today.  Having some pain in his right shoulder after he leaned over the bed to grab something and hurt it.  Otherwise no complaints.  Denies nausea or vomiting.   Medications:  Current Facility-Administered Medications  Medication Dose Route Frequency Provider Last Rate Last Admin   0.9 %  sodium chloride infusion  250 mL Intravenous PRN Alfredo Martinez, MD       acetaminophen (TYLENOL) tablet 650 mg  650 mg Oral Q4H PRN Jena Gauss, Allee, MD       albuterol (PROVENTIL) (2.5 MG/3ML) 0.083% nebulizer solution 2.5 mg  2.5 mg Nebulization Q6H PRN Rejeana Brock, MD       ALPRAZolam Prudy Feeler) tablet 1-2 mg  1-2 mg Oral QHS PRN Alfredo Martinez, MD   2 mg at 04/27/23 2334   amLODipine (NORVASC) tablet 10 mg  10 mg Oral Daily Alfredo Martinez, MD   10 mg at 04/27/23 1610   aspirin EC tablet 81 mg  81 mg Oral q AM Alfredo Martinez, MD   81 mg at 04/27/23 0851   atorvastatin (LIPITOR) tablet 80 mg  80 mg Oral q1800 Alfredo Martinez, MD   80 mg at 04/27/23 1705   buPROPion (WELLBUTRIN XL) 24 hr tablet 150 mg  150 mg Oral Daily Jena Gauss, Allee, MD   150 mg at 04/27/23 0852   carvedilol (COREG) tablet 12.5 mg  12.5 mg Oral BID WC Jena Gauss, Allee, MD   12.5 mg at 04/28/23 0806   clopidogrel (PLAVIX) tablet 75 mg  75 mg Oral Q breakfast Alfredo Martinez, MD   75 mg at 04/28/23 0806   enoxaparin (LOVENOX) injection 80  mg  80 mg Subcutaneous Q24H Jena Gauss, Allee, MD   80 mg at 04/27/23 1706   escitalopram (LEXAPRO) tablet 10 mg  10 mg Oral q AM Alfredo Martinez, MD   10 mg at 04/27/23 0851   furosemide (LASIX) injection 80 mg  80 mg Intravenous BID Alfredo Martinez, MD   80 mg at 04/28/23 4782   icosapent Ethyl (VASCEPA) 1 g capsule 2 g  2 g Oral BID Alfredo Martinez, MD   2 g at 04/27/23 2154   insulin aspart (novoLOG) injection 0-15 Units  0-15 Units  Subcutaneous TID WC Alfredo Martinez, MD   5 Units at 04/28/23 0806   insulin aspart (novoLOG) injection 0-5 Units  0-5 Units Subcutaneous QHS Lincoln Brigham, MD   3 Units at 04/27/23 2323   insulin glargine-yfgn (SEMGLEE) injection 20 Units  20 Units Subcutaneous BID Alfredo Martinez, MD   20 Units at 04/27/23 2154   loratadine (CLARITIN) tablet 10 mg  10 mg Oral Daily Lincoln Brigham, MD   10 mg at 04/27/23 2231   oxyCODONE (Oxy IR/ROXICODONE) immediate release tablet 15 mg  15 mg Oral Q4H PRN Alfredo Martinez, MD   15 mg at 04/28/23 9562   pantoprazole (PROTONIX) EC tablet 40 mg  40 mg Oral Daily Alfredo Martinez, MD   40 mg at 04/27/23 1308   prednisoLONE acetate (PRED FORTE) 1 % ophthalmic suspension 1 drop  1 drop Left Eye QID Alfredo Martinez, MD   1 drop at 04/27/23 1706   pregabalin (LYRICA) capsule 150 mg  150 mg Oral QHS Maxwell, Allee, MD   150 mg at 04/27/23 2154   pregabalin (LYRICA) capsule 300 mg  300 mg Oral Daily Alfredo Martinez, MD   300 mg at 04/27/23 0851   ranolazine (RANEXA) 12 hr tablet 500 mg  500 mg Oral BID Alfredo Martinez, MD   500 mg at 04/27/23 2153   sodium chloride flush (NS) 0.9 % injection 3 mL  3 mL Intravenous Q12H Maxwell, Allee, MD   3 mL at 04/27/23 2155   sodium chloride flush (NS) 0.9 % injection 3 mL  3 mL Intravenous PRN Alfredo Martinez, MD          Review of Systems: 10 systems reviewed and negative except per interval history/subjective  Physical Exam: Vitals:   04/28/23 0531 04/28/23 0930  BP: 129/78 119/61  Pulse: 77 80  Resp: 16 18  Temp: (!) 97.5 F (36.4 C) 97.7 F (36.5 C)  SpO2: 100% 98%   No intake/output data recorded.  Intake/Output Summary (Last 24 hours) at 04/28/2023 6578 Last data filed at 04/28/2023 4696 Gross per 24 hour  Intake 840 ml  Output 2600 ml  Net -1760 ml   Constitutional: Obese, well-appearing, no acute distress ENMT: ears and nose without scars or lesions, MMM CV: normal rate, 2+ edema in the bilateral thighs Respiratory:  Bilateral chest rise, normal work of breathing Gastrointestinal: soft, non-tender, no palpable masses or hernias Skin: no visible lesions or rashes Psych: alert, judgement/insight appropriate, appropriate mood and affect   Test Results I personally reviewed new and old clinical labs and radiology tests Lab Results  Component Value Date   NA 136 04/28/2023   K 5.0 04/28/2023   CL 101 04/28/2023   CO2 26 04/28/2023   BUN 41 (H) 04/28/2023   CREATININE 1.75 (H) 04/28/2023   GFR 78.79 05/28/2015   CALCIUM 8.7 (L) 04/28/2023   ALBUMIN 2.2 (L) 04/27/2023   PHOS 2.4 (L) 07/04/2022  CBC Recent Labs  Lab 04/26/23 1212  WBC 6.7  HGB 12.4*  HCT 40.9  MCV 79.9*  PLT 261

## 2023-04-29 DIAGNOSIS — Z89511 Acquired absence of right leg below knee: Secondary | ICD-10-CM | POA: Diagnosis not present

## 2023-04-29 DIAGNOSIS — I7121 Aneurysm of the ascending aorta, without rupture: Secondary | ICD-10-CM | POA: Diagnosis present

## 2023-04-29 DIAGNOSIS — E1165 Type 2 diabetes mellitus with hyperglycemia: Secondary | ICD-10-CM | POA: Diagnosis present

## 2023-04-29 DIAGNOSIS — Z89512 Acquired absence of left leg below knee: Secondary | ICD-10-CM | POA: Diagnosis not present

## 2023-04-29 DIAGNOSIS — R609 Edema, unspecified: Secondary | ICD-10-CM | POA: Diagnosis not present

## 2023-04-29 DIAGNOSIS — E1140 Type 2 diabetes mellitus with diabetic neuropathy, unspecified: Secondary | ICD-10-CM | POA: Diagnosis present

## 2023-04-29 DIAGNOSIS — R079 Chest pain, unspecified: Secondary | ICD-10-CM | POA: Diagnosis not present

## 2023-04-29 DIAGNOSIS — R531 Weakness: Secondary | ICD-10-CM | POA: Diagnosis not present

## 2023-04-29 DIAGNOSIS — Z6841 Body Mass Index (BMI) 40.0 and over, adult: Secondary | ICD-10-CM | POA: Diagnosis not present

## 2023-04-29 DIAGNOSIS — D631 Anemia in chronic kidney disease: Secondary | ICD-10-CM | POA: Diagnosis present

## 2023-04-29 DIAGNOSIS — J4489 Other specified chronic obstructive pulmonary disease: Secondary | ICD-10-CM | POA: Diagnosis present

## 2023-04-29 DIAGNOSIS — Z794 Long term (current) use of insulin: Secondary | ICD-10-CM | POA: Diagnosis not present

## 2023-04-29 DIAGNOSIS — N179 Acute kidney failure, unspecified: Secondary | ICD-10-CM | POA: Diagnosis present

## 2023-04-29 DIAGNOSIS — I5033 Acute on chronic diastolic (congestive) heart failure: Secondary | ICD-10-CM | POA: Diagnosis present

## 2023-04-29 DIAGNOSIS — G8929 Other chronic pain: Secondary | ICD-10-CM | POA: Diagnosis present

## 2023-04-29 DIAGNOSIS — E1122 Type 2 diabetes mellitus with diabetic chronic kidney disease: Secondary | ICD-10-CM | POA: Diagnosis present

## 2023-04-29 DIAGNOSIS — N049 Nephrotic syndrome with unspecified morphologic changes: Secondary | ICD-10-CM | POA: Diagnosis not present

## 2023-04-29 DIAGNOSIS — I959 Hypotension, unspecified: Secondary | ICD-10-CM | POA: Diagnosis present

## 2023-04-29 DIAGNOSIS — Q231 Congenital insufficiency of aortic valve: Secondary | ICD-10-CM | POA: Diagnosis not present

## 2023-04-29 DIAGNOSIS — Z951 Presence of aortocoronary bypass graft: Secondary | ICD-10-CM | POA: Diagnosis not present

## 2023-04-29 DIAGNOSIS — E1151 Type 2 diabetes mellitus with diabetic peripheral angiopathy without gangrene: Secondary | ICD-10-CM | POA: Diagnosis present

## 2023-04-29 DIAGNOSIS — F32A Depression, unspecified: Secondary | ICD-10-CM | POA: Diagnosis present

## 2023-04-29 DIAGNOSIS — E875 Hyperkalemia: Secondary | ICD-10-CM | POA: Diagnosis present

## 2023-04-29 DIAGNOSIS — I130 Hypertensive heart and chronic kidney disease with heart failure and stage 1 through stage 4 chronic kidney disease, or unspecified chronic kidney disease: Secondary | ICD-10-CM | POA: Diagnosis present

## 2023-04-29 DIAGNOSIS — E785 Hyperlipidemia, unspecified: Secondary | ICD-10-CM | POA: Diagnosis present

## 2023-04-29 DIAGNOSIS — I25110 Atherosclerotic heart disease of native coronary artery with unstable angina pectoris: Secondary | ICD-10-CM | POA: Diagnosis present

## 2023-04-29 DIAGNOSIS — N1832 Chronic kidney disease, stage 3b: Secondary | ICD-10-CM | POA: Diagnosis present

## 2023-04-29 LAB — GLUCOSE, CAPILLARY
Glucose-Capillary: 198 mg/dL — ABNORMAL HIGH (ref 70–99)
Glucose-Capillary: 211 mg/dL — ABNORMAL HIGH (ref 70–99)
Glucose-Capillary: 217 mg/dL — ABNORMAL HIGH (ref 70–99)
Glucose-Capillary: 192 mg/dL — ABNORMAL HIGH (ref 70–99)

## 2023-04-29 LAB — MICROALBUMIN / CREATININE URINE RATIO
Creatinine, Urine: 41.5 mg/dL
Microalb Creat Ratio: 3279 mg/g creat — ABNORMAL HIGH (ref 0–29)
Microalb, Ur: 1360.6 ug/mL — ABNORMAL HIGH

## 2023-04-29 LAB — BASIC METABOLIC PANEL
Anion gap: 9 (ref 5–15)
BUN: 44 mg/dL — ABNORMAL HIGH (ref 6–20)
CO2: 26 mmol/L (ref 22–32)
Calcium: 8.4 mg/dL — ABNORMAL LOW (ref 8.9–10.3)
Chloride: 101 mmol/L (ref 98–111)
Creatinine, Ser: 1.9 mg/dL — ABNORMAL HIGH (ref 0.61–1.24)
GFR, Estimated: 42 mL/min — ABNORMAL LOW (ref >60)
Glucose, Bld: 279 mg/dL — ABNORMAL HIGH (ref 70–99)
Potassium: 4.8 mmol/L (ref 3.5–5.1)
Sodium: 136 mmol/L (ref 135–145)

## 2023-04-29 MED ORDER — TORSEMIDE 20 MG PO TABS
60.0000 mg | ORAL_TABLET | Freq: Two times a day (BID) | ORAL | Status: DC
  Administered 2023-04-29: 60 mg via ORAL
  Filled 2023-04-29: qty 3

## 2023-04-29 MED ORDER — FUROSEMIDE 40 MG PO TABS
80.0000 mg | ORAL_TABLET | Freq: Two times a day (BID) | ORAL | Status: DC
  Administered 2023-04-29: 80 mg via ORAL
  Filled 2023-04-29: qty 2

## 2023-04-29 MED ORDER — INSULIN GLARGINE-YFGN 100 UNIT/ML ~~LOC~~ SOLN
30.0000 [IU] | Freq: Two times a day (BID) | SUBCUTANEOUS | Status: DC
  Administered 2023-04-29 – 2023-04-30 (×2): 30 [IU] via SUBCUTANEOUS
  Filled 2023-04-29 (×3): qty 0.3

## 2023-04-29 MED ORDER — DAPAGLIFLOZIN PROPANEDIOL 10 MG PO TABS
10.0000 mg | ORAL_TABLET | Freq: Every day | ORAL | Status: DC
  Administered 2023-04-29 – 2023-05-03 (×5): 10 mg via ORAL
  Filled 2023-04-29 (×6): qty 1

## 2023-04-29 NOTE — Progress Notes (Signed)
Mobility Specialist Progress Note   04/29/23 1108  Mobility  Activity Transferred from bed to chair  Level of Assistance Standby assist, set-up cues, supervision of patient - no hands on  Assistive Device None  Range of Motion/Exercises All extremities  LLE Weight Bearing NWB  Activity Response Tolerated well  Mobility Referral Yes  $Mobility charge 1 Mobility   Received in bed c/o general pain all over but agreeable. Able to AP transfer to chair w/o physical assist but requiring someone to brace chair. Able to tolerated seated level exercises once in the chair but c/o increased pain in R shoulder post mobility. Given ice pack, placed call bell in reach and chair alarm is on.  Frederico Hamman Mobility Specialist Please contact via SecureChat or  Rehab office at [phone]

## 2023-04-29 NOTE — Inpatient Diabetes Management (Signed)
Inpatient Diabetes Program Recommendations  AACE/ADA: New Consensus Statement on Inpatient Glycemic Control (2015)  Target Ranges:  Prepandial:   less than 140 mg/dL      Peak postprandial:   less than 180 mg/dL (1-2 hours)      Critically ill patients:  140 - 180 mg/dL   Lab Results  Component Value Date   GLUCAP 198 (H) 04/29/2023   HGBA1C 7.4 (H) 04/26/2023    Review of Glycemic Control  Latest Reference Range & Units 04/28/23 07:29 04/28/23 11:21 04/28/23 16:26 04/28/23 20:08 04/29/23 07:36  Glucose-Capillary 70 - 99 mg/dL 161 (H) 096 (H) 045 (H) 286 (H) 198 (H)   Diabetes history: DM 2 Outpatient Diabetes medications: Lantus 20 units bid, Novolog 3 units tid, Metformin 1000 mg bid  Current orders for Inpatient glycemic control:  Semglee 20 units bid Novolog 0-15 units tid + hs  A1c 7.4% on 5/7  Inpatient Diabetes Program Recommendations:    Note: trends increase after PO intake  -  Consider adding Novolog 3 units tid meal coverage if eating >50% of meals  Thanks,  Christena Deem RN, MSN, BC-ADM Inpatient Diabetes Coordinator Team Pager [phone] (8a-5p)

## 2023-04-29 NOTE — TOC Progression Note (Signed)
Transition of Care [facility]) - Progression Note    Patient Details  Name: Joseph [name] MRN: [id number] Date of Birth: 03-22-1971  Transition of Care [facility]) CM/SW Contact  Tom-Johnson, Hershal Coria, RN Phone Number: 04/29/2023, 1:14 PM  Clinical Narrative:     CM consulted for Outpatient transportation to and from appointments.  Patient stated that he uses [employer] through [employer] and he also has Medicaid during assessment on 04/27/23. CM informed patient that he can call Medicaid for transportation assistance if he is having difficulties with [employer]. Medicaid information given to patient to call in days of his appointments.  CM will continue to follow as patient progresses with care towards discharge.        Expected Discharge Plan: OP Rehab Barriers to Discharge: Continued Medical Work up  Expected Discharge Plan and Services   Discharge Planning Services: CM Consult Post Acute Care Choice: NA (Outpatient) Living arrangements for the past 2 months: Single Family Home                 DME Arranged: Walker tall (Bariatric) DME Agency: NA       HH Arranged: NA HH Agency: NA         Social Determinants of Health (SDOH) Interventions SDOH Screenings   Food Insecurity: No Food Insecurity (09/09/2022)  Housing: Medium Risk (09/09/2022)  Transportation Needs: Unmet Transportation Needs (09/09/2022)  Utilities: Not At Risk (09/09/2022)  Depression (PHQ2-9): Low Risk  (05/06/2021)  Financial Resource Strain: Medium Risk (09/09/2022)  Tobacco Use: Medium Risk (04/26/2023)    Readmission Risk Interventions    11/24/2021    4:05 PM 08/20/2021   12:26 PM 07/12/2021   12:29 PM  Readmission Risk Prevention Plan  Transportation Screening Complete Complete Complete  Medication Review Oceanographer) Complete Complete Complete  PCP or Specialist appointment within 3-5 days of discharge Complete Complete Complete  HRI or Home Care Consult Complete  Complete Complete  SW Recovery Care/Counseling Consult Complete Complete Complete  Palliative Care Screening Not Applicable Not Applicable Not Applicable  Skilled Nursing Facility Complete Not Applicable Not Applicable

## 2023-04-29 NOTE — Progress Notes (Signed)
Physical Therapy Treatment Patient Details Name: Joseph [name] MRN: [id number] DOB: 10-09-1971 Today's Date: 04/29/2023   History of Present Illness Pt is a 52 y/o male who presents 04/26/23 per his cardiologist's request 2 abnormal labs and volume overload. PMH signficant for AAA aneurysm, anxiety, asthma, CAD, COPD, DM, DVT, HNT, migraine, morbid obesity, MI, PE, peripheral nerve disease, B BKA with prosthetics, CABG, bilateral shoulder open rotator cuff repair.    PT Comments    Pt greeted up in chair on arrival and agreeable to mobility with good progress. Pt able to complete anterior scoot transfer from recliner to bed with set up and supervision for safety with pt demonstrating good strength through UE and good safety awareness. Pt able to complete bed mobility at supervision level. Prosthetics not in room, plan to progress standing as able with RLE prosthetic donned once/if available. Pt continues to benefit from skilled PT services to progress toward functional mobility goals.    Recommendations for follow up therapy are one component of a multi-disciplinary discharge planning process, led by the attending physician.  Recommendations may be updated based on patient status, additional functional criteria and insurance authorization.  Follow Up Recommendations       Assistance Recommended at Discharge Set up Supervision/Assistance  Patient can return home with the following A little help with walking and/or transfers;Assistance with cooking/housework;Assist for transportation;Help with stairs or ramp for entrance   Equipment Recommendations  Rolling walker (2 wheels) (Bariatric size and to accommodate 6'5" height)    Recommendations for Other Services       Precautions / Restrictions Precautions Precautions: Fall Required Braces or Orthoses: Other Brace Other Brace: Prosthetics, not present in room on eval Restrictions Weight Bearing Restrictions: Yes LLE Weight Bearing: Non  weight bearing     Mobility  Bed Mobility Overal bed mobility: Modified Independent             General bed mobility comments: Pt was able to transition to/from EOB without assist. HOB elevated.    Transfers Overall transfer level: Needs assistance Equipment used: None Transfers: Bed to chair/wheelchair/BSC         Anterior-Posterior transfers: Supervision   General transfer comment: pt able to anteriorly trasnfer from chair to bed with supervision and set up    Ambulation/Gait               General Gait Details: unable   Stairs             Wheelchair Mobility    Modified Rankin (Stroke Patients Only)       Balance Overall balance assessment: Modified Independent Sitting-balance support: No upper extremity supported, Feet unsupported Sitting balance-Leahy Scale: Good                                      Cognition Arousal/Alertness: Awake/alert Behavior During Therapy: WFL for tasks assessed/performed Overall Cognitive Status: Within Functional Limits for tasks assessed                                          Exercises      General Comments General comments (skin integrity, edema, etc.): VSS on RA      Pertinent Vitals/Pain Pain Assessment Pain Assessment: Faces Faces Pain Scale: Hurts little more Pain Location: R shoulder, L residual limb Pain  Descriptors / Indicators: Discomfort, Grimacing, Guarding, Sore Pain Intervention(s): Monitored during session, Limited activity within patient's tolerance, Ice applied    Home Living                          Prior Function            PT Goals (current goals can now be found in the care plan section) Acute Rehab PT Goals Patient Stated Goal: Return to independence PT Goal Formulation: With patient Time For Goal Achievement: 05/04/23 Progress towards PT goals: Progressing toward goals    Frequency    Min 2X/week      PT Plan       Co-evaluation              AM-PAC PT "6 Clicks" Mobility   Outcome Measure  Help needed turning from your back to your side while in a flat bed without using bedrails?: None Help needed moving from lying on your back to sitting on the side of a flat bed without using bedrails?: None Help needed moving to and from a bed to a chair (including a wheelchair)?: A Little Help needed standing up from a chair using your arms (e.g., wheelchair or bedside chair)?: A Lot Help needed to walk in hospital room?: A Lot Help needed climbing 3-5 steps with a railing? : Total 6 Click Score: 16    End of Session   Activity Tolerance: Patient tolerated treatment well Patient left: in bed;with call bell/phone within reach Nurse Communication: Mobility status PT Visit Diagnosis: Other abnormalities of gait and mobility (R26.89);Difficulty in walking, not elsewhere classified (R26.2) Pain - part of body: Shoulder;Leg     Time: 0454-0981 PT Time Calculation (min) (ACUTE ONLY): 18 min  Charges:  $Therapeutic Activity: 8-22 mins                     Estephanie Hubbs R. PTA Acute Rehabilitation Services Office: [phone]    Catalina Antigua 04/29/2023, 3:40 PM

## 2023-04-29 NOTE — Progress Notes (Addendum)
Daily Progress Note Intern Pager: [phone]  Patient name: Joseph [name] Medical record number: [id number] Date of birth: 1971/05/21 Age: 52 y.o. Gender: male  Primary Care Provider: Marva Panda, NP Consultants: Cardiology, Nephrology Code Status: Full code   Pt Overview and Major Events to Date:  5/7: Admitted to FMTS 5/8: Nephrology consulted   Assessment and Plan: Joseph [name] is a 52 y.o. male presenting with hypervolemia in the setting of HF exacerbation with concurrent AKI on CKD.  PMHx includes T2DM, bilateral BKA d/t diabetic foot, HLD, CAD s/p CABG, PE, CHF, PAD, aortic aneurysm, COPD, anxiety, GERD, HTN, and OSA.  * Heart failure with preserved ejection fraction (HCC) 1.7L UOP charted but none documented overnight. Patient reports additional 1.5L this AM. Weight trending up but possible bed weight error. Appears to have good diuresis will continue oral therapy. Switch to Torsemide 60mg  BID per Nephro -Torsemide 60mg  BID -BMP daily -Continue home Coreg 12.5mg  BID  Acute renal failure superimposed on stage 3a chronic kidney disease (HCC) Cr increased to 1.9 from prior day. Need to monitor closely given diuresis -Nephrology following -Avoid nephrotoxic agents  History of pulmonary embolus (PE) Per Cardiology, patient likely needs outpatient hematology follow up to discuss anticoagulation for multiple prior PEs.  Nephrosis Nephrology following for nephrotic range proteinuria. UA showed significant proteinuria. Repeat PCR pending. Mild elevation in kappa/lambda ratio, will defer to Nephro for input. Potentially continue work up outpatient but patient with transportation difficulties that may limit f/u. -Nephrology consulted, appreciate recs -Start Marcelline Deist 10mg  daily -F/u repeat UPCR -Pending SPEP  Below-knee amputation of left lower extremity (HCC) Clean dry bandage. Continue wound care.  Insulin dependent type 2 diabetes mellitus (HCC) BGL  consistently in 200s, fasting around 200. -Increase to Semglee 30U BID -mSSI with CBG checks   Chronic stable conditions: Anxiety: continue Xanax 2mg  daily, Bupriprion 150mg  daily, Escitalopram 10mg  daily HLD: continue Atorvastatin 80mg  daily PAD: continue ASA and Plavix GERD: continue pantoprazole 40mg   Neuropathy: continue Pregabalin 150mg  TID OSA: CPAP nightly HTN: continue Amlodipine Unstable angina: continue Ranolazine 150mg  BID   FEN/GI: Heart healthy VTE Prophylaxis: Lovenox Dispo: Home pending diuresis and Nephro evaluation  Subjective:  Patient assessed at bedside, reports he slept very well overnight and has a lot of energy this AM. Did not wake overnight to urinate but urinated 1.5L this AM. Had BM last night. States he feels about the same in regards to swelling. Transportation difficulties, feels he would have challenges following up outpatient.  Objective: Temp:  [97.6 F (36.4 C)-98.2 F (36.8 C)] 98 F (36.7 C) (05/10 0910) Pulse Rate:  [77-83] 77 (05/10 0910) Resp:  [18-19] 18 (05/10 0910) BP: (108-136)/(69-80) 128/76 (05/10 0910) SpO2:  [96 %-98 %] 98 % (05/10 0910) Weight:  [180.8 kg] 180.8 kg (05/10 0451) Physical Exam: General: Sitting up in bed, shirtless, NAD Cardiovascular: RRR without murmur Respiratory: CTAB. Normal WOB on RA Abdomen: Large pannus, non-tender Extremities: Non-pitting edema similar to previous day, mildly improved in UE.  Laboratory: Most recent CBC Lab Results  Component Value Date   WBC 6.7 04/26/2023   HGB 12.4 (L) 04/26/2023   HCT 40.9 04/26/2023   MCV 79.9 (L) 04/26/2023   PLT 261 04/26/2023   Most recent BMP    Latest Ref Rng & Units 04/29/2023   12:47 AM  BMP  Glucose 70 - 99 mg/dL 147   BUN 6 - 20 mg/dL 44   Creatinine 8.29 - 1.24 mg/dL 5.62   Sodium 130 -  145 mmol/L 136   Potassium 3.5 - 5.1 mmol/L 4.8   Chloride 98 - 111 mmol/L 101   CO2 22 - 32 mmol/L 26   Calcium 8.9 - 10.3 mg/dL 8.4     Other  pertinent labs: Glu: 198 Kappa/lambda ratio: 2.13 UA: >300 protein   [name], MD 04/29/2023, 12:40 PM  PGY-1, [facility] FPTS Intern pager: [phone], text pages welcome Secure chat group [facility] Teaching Service

## 2023-04-29 NOTE — Plan of Care (Signed)
  Problem: Education: Goal: Ability to describe self-care measures that may prevent or decrease complications (Diabetes Survival Skills Education) will improve Outcome: Progressing Goal: Individualized Educational Video(s) Outcome: Progressing   Problem: Coping: Goal: Ability to adjust to condition or change in health will improve Outcome: Progressing   Problem: Fluid Volume: Goal: Ability to maintain a balanced intake and output will improve Outcome: Progressing   Problem: Health Behavior/Discharge Planning: Goal: Ability to identify and utilize available resources and services will improve Outcome: Progressing Goal: Ability to manage health-related needs will improve Outcome: Progressing   Problem: Metabolic: Goal: Ability to maintain appropriate glucose levels will improve Outcome: Progressing   Problem: Nutritional: Goal: Maintenance of adequate nutrition will improve Outcome: Progressing Goal: Progress toward achieving an optimal weight will improve Outcome: Progressing   Problem: Skin Integrity: Goal: Risk for impaired skin integrity will decrease Outcome: Progressing   Problem: Tissue Perfusion: Goal: Adequacy of tissue perfusion will improve Outcome: Progressing   Problem: Education: Goal: Ability to demonstrate management of disease process will improve Outcome: Progressing Goal: Ability to verbalize understanding of medication therapies will improve Outcome: Progressing Goal: Individualized Educational Video(s) Outcome: Progressing   Problem: Activity: Goal: Capacity to carry out activities will improve Outcome: Progressing   Problem: Cardiac: Goal: Ability to achieve and maintain adequate cardiopulmonary perfusion will improve Outcome: Progressing   Problem: Education: Goal: Knowledge of General Education information will improve Description: Including pain rating scale, medication(s)/side effects and non-pharmacologic comfort measures Outcome:  Progressing   Problem: Health Behavior/Discharge Planning: Goal: Ability to manage health-related needs will improve Outcome: Progressing   Problem: Clinical Measurements: Goal: Ability to maintain clinical measurements within normal limits will improve Outcome: Progressing Goal: Will remain free from infection Outcome: Progressing Goal: Diagnostic test results will improve Outcome: Progressing Goal: Respiratory complications will improve Outcome: Progressing Goal: Cardiovascular complication will be avoided Outcome: Progressing   Problem: Activity: Goal: Risk for activity intolerance will decrease Outcome: Progressing   Problem: Nutrition: Goal: Adequate nutrition will be maintained Outcome: Progressing   Problem: Coping: Goal: Level of anxiety will decrease Outcome: Progressing   Problem: Elimination: Goal: Will not experience complications related to bowel motility Outcome: Progressing Goal: Will not experience complications related to urinary retention Outcome: Progressing   Problem: Pain Managment: Goal: General experience of comfort will improve Outcome: Progressing   Problem: Safety: Goal: Ability to remain free from injury will improve Outcome: Progressing   Problem: Skin Integrity: Goal: Risk for impaired skin integrity will decrease Outcome: Progressing   

## 2023-04-29 NOTE — Plan of Care (Signed)
  Problem: Education: Goal: Ability to describe self-care measures that may prevent or decrease complications (Diabetes Survival Skills Education) will improve Outcome: Progressing   

## 2023-04-29 NOTE — Discharge Instructions (Addendum)
Dear Joseph [name],   Thank you so much for allowing [place] to be part of your care!  You were admitted to [facility] for concern of volume overload. We involved nephrology and treated you for concern of worsening heart failure and nephrotic syndrome (protein in your urine).  Please continue taking Lasix 80mg  twice per day when you go home.   POST-HOSPITAL & CARE INSTRUCTIONS Follow up with nephrology outpatient  Please let PCP/Specialists know of any changes that were made.  Please see medications section of this packet for any medication changes.   DOCTOR'S APPOINTMENT & FOLLOW UP CARE INSTRUCTIONS  Future Appointments  Date Time Provider Department Center  05/02/2023  2:45 PM [facility]  05/17/2023 11:00 AM [facility] OP 1 [facility]  06/07/2023  3:35 PM Dick, Devoria Albe., NP [facility]    RETURN PRECAUTIONS:   Take care and be well!  Family Medicine Teaching Service  [place]  [facility]  [address] [place], [name] [address] [phone]

## 2023-04-29 NOTE — Progress Notes (Signed)
Nephrology Follow-Up Consult note   Assessment/Recommendations: Joseph [name] is a/an 52 y.o. male with a past medical history significant for CAD, PAD status post bilateral BKA, morbid obesity, DM2, PE, HFpEF COPD who presents with heart failure exacerbation found to have proteinuria   Proteinuria: UPC found to be 5.9.  Most likely diabetic kidney disease but obtaining UACR and urinalysis here to ensure there is not discrepancy between UPC and UACR which could sometimes indicate Bence-Jones proteinuria.  Also obtaining SPEP and serum free light chains.  Can most likely be worked up further in the outpatient setting   CKD 3A: Baseline creatinine seems to be around 1.5 with current value not far from baseline.  Creatinine has increased with some diuresis.  Although based on his weight he may not be negative from yesterday.  Some of this is expected as we try to optimize his volume status -Continue with diuresis as below -Continue to monitor daily Cr, Dose meds for GFR -Monitor Daily I/Os, Daily weight  -Maintain MAP>65 for optimal renal perfusion.  -Avoid nephrotoxic medications including NSAIDs -Use synthetic opioids (Fentanyl/Dilaudid) if needed -Currently no indication for HD   CHF exacerbation: Has been diuresed well on Lasix.  Switched to oral medication yesterday and weight increased but unclear if it is accurate.  He still looks fairly volume overloaded to me.  Based on his outpatient requirements for diuretics I think he will need a more extensive regimen.  I will change him to torsemide 60 mg twice daily and would increase the dose if needed.  I agree with Dr.Chambliss thoughts in his note yesterday regarding SGLT2i.  It has the potential to help multiple things including heart failure, kidney disease, diabetes.  We will have to monitor closely for candidal infections of the skin folds given his significant obesity but I will start him on Farxiga 10mg  daily. This will likely also help with  volume overload   Hypertension: On amlodipine 10 mg daily and carvedilol 12.5 mg twice daily.  May consider decreasing his amlodipine if blood pressure drops with diuresis.  In the future we may want to consider an ARB because of his proteinuria   Uncontrolled Diabetes Mellitus Type 2 with Hyperglycemia: Management per primary team   Recommendations conveyed to primary service.    Darnell Level [facility] 04/29/2023 10:30 AM  ___________________________________________________________  CC: Volume overload  Interval History/Subjective: Patient states he slept really well last night with using CPAP.  Feels like he has a lot of edema still on his left stump.  Only received Lasix 80 mg oral yesterday.  Weight increased today but hard to say if it is accurate   Medications:  Current Facility-Administered Medications  Medication Dose Route Frequency Provider Last Rate Last Admin   0.9 %  sodium chloride infusion  250 mL Intravenous PRN Alfredo Martinez, MD       acetaminophen (TYLENOL) tablet 650 mg  650 mg Oral Q4H PRN Maxwell, Allee, MD       albuterol (PROVENTIL) (2.5 MG/3ML) 0.083% nebulizer solution 2.5 mg  2.5 mg Nebulization Q6H PRN Rejeana Brock, MD       ALPRAZolam Prudy Feeler) tablet 1-2 mg  1-2 mg Oral QHS PRN Alfredo Martinez, MD   1 mg at 04/28/23 2308   amLODipine (NORVASC) tablet 10 mg  10 mg Oral Daily Alfredo Martinez, MD   10 mg at 04/29/23 1009   aspirin EC tablet 81 mg  81 mg Oral q AM Alfredo Martinez, MD  81 mg at 04/29/23 1009   atorvastatin (LIPITOR) tablet 80 mg  80 mg Oral q1800 Alfredo Martinez, MD   80 mg at 04/28/23 1646   buPROPion (WELLBUTRIN XL) 24 hr tablet 150 mg  150 mg Oral Daily Alfredo Martinez, MD   150 mg at 04/29/23 1009   carvedilol (COREG) tablet 12.5 mg  12.5 mg Oral BID WC Alfredo Martinez, MD   12.5 mg at 04/29/23 0758   clopidogrel (PLAVIX) tablet 75 mg  75 mg Oral Q breakfast Alfredo Martinez, MD   75 mg at 04/29/23 0758   enoxaparin  (LOVENOX) injection 80 mg  80 mg Subcutaneous Q24H Alfredo Martinez, MD   80 mg at 04/28/23 1648   escitalopram (LEXAPRO) tablet 10 mg  10 mg Oral q AM Alfredo Martinez, MD   10 mg at 04/29/23 1009   icosapent Ethyl (VASCEPA) 1 g capsule 2 g  2 g Oral BID Alfredo Martinez, MD   2 g at 04/29/23 1009   insulin aspart (novoLOG) injection 0-15 Units  0-15 Units Subcutaneous TID WC Alfredo Martinez, MD   3 Units at 04/29/23 0758   insulin aspart (novoLOG) injection 0-5 Units  0-5 Units Subcutaneous QHS Lincoln Brigham, MD   3 Units at 04/28/23 2208   insulin glargine-yfgn (SEMGLEE) injection 25 Units  25 Units Subcutaneous BID Tiffany Kocher, DO   25 Units at 04/29/23 1010   loratadine (CLARITIN) tablet 10 mg  10 mg Oral Daily Lincoln Brigham, MD   10 mg at 04/29/23 1009   oxyCODONE (Oxy IR/ROXICODONE) immediate release tablet 15 mg  15 mg Oral Q4H PRN Alfredo Martinez, MD   15 mg at 04/29/23 0805   pantoprazole (PROTONIX) EC tablet 40 mg  40 mg Oral Daily Alfredo Martinez, MD   40 mg at 04/29/23 1009   polyethylene glycol (MIRALAX / GLYCOLAX) packet 17 g  17 g Oral Daily [name], MD       prednisoLONE acetate (PRED FORTE) 1 % ophthalmic suspension 1 drop  1 drop Left Eye QID Alfredo Martinez, MD   1 drop at 04/27/23 1706   pregabalin (LYRICA) capsule 150 mg  150 mg Oral QHS Alfredo Martinez, MD   150 mg at 04/28/23 2204   pregabalin (LYRICA) capsule 300 mg  300 mg Oral Daily Alfredo Martinez, MD   300 mg at 04/29/23 1009   ranolazine (RANEXA) 12 hr tablet 500 mg  500 mg Oral BID Alfredo Martinez, MD   500 mg at 04/29/23 1009   sodium chloride flush (NS) 0.9 % injection 3 mL  3 mL Intravenous Q12H Maxwell, Allee, MD   3 mL at 04/29/23 1016   sodium chloride flush (NS) 0.9 % injection 3 mL  3 mL Intravenous PRN Alfredo Martinez, MD       torsemide (DEMADEX) tablet 60 mg  60 mg Oral BID Darnell Level, MD          Review of Systems: 10 systems reviewed and negative except per interval history/subjective  Physical  Exam: Vitals:   04/29/23 0451 04/29/23 0910  BP: 136/80 128/76  Pulse: 78 77  Resp: 18 18  Temp: 97.6 F (36.4 C) 98 F (36.7 C)  SpO2: 97% 98%   Total I/O In: 120 [P.O.:120] Out: 250 [Urine:250]  Intake/Output Summary (Last 24 hours) at 04/29/2023 1030 Last data filed at 04/29/2023 1610 Gross per 24 hour  Intake 963 ml  Output 2000 ml  Net -1037 ml   Constitutional: Obese, well-appearing, no acute distress  ENMT: ears and nose without scars or lesions, MMM CV: normal rate, 2+ edema in the bilateral thighs Respiratory: Bilateral chest rise, normal work of breathing Gastrointestinal: soft, non-tender, no palpable masses or hernias Skin: no visible lesions or rashes Psych: alert, judgement/insight appropriate, appropriate mood and affect   Test Results I personally reviewed new and old clinical labs and radiology tests Lab Results  Component Value Date   NA 136 04/29/2023   K 4.8 04/29/2023   CL 101 04/29/2023   CO2 26 04/29/2023   BUN 44 (H) 04/29/2023   CREATININE 1.90 (H) 04/29/2023   GFR 78.79 05/28/2015   CALCIUM 8.4 (L) 04/29/2023   ALBUMIN 2.2 (L) 04/27/2023   PHOS 2.4 (L) 07/04/2022    CBC Recent Labs  Lab 04/26/23 1212  WBC 6.7  HGB 12.4*  HCT 40.9  MCV 79.9*  PLT 261

## 2023-04-30 DIAGNOSIS — N049 Nephrotic syndrome with unspecified morphologic changes: Secondary | ICD-10-CM

## 2023-04-30 LAB — BASIC METABOLIC PANEL
Anion gap: 7 (ref 5–15)
BUN: 50 mg/dL — ABNORMAL HIGH (ref 6–20)
CO2: 28 mmol/L (ref 22–32)
Calcium: 8.5 mg/dL — ABNORMAL LOW (ref 8.9–10.3)
Chloride: 101 mmol/L (ref 98–111)
Creatinine, Ser: 2.09 mg/dL — ABNORMAL HIGH (ref 0.61–1.24)
GFR, Estimated: 38 mL/min — ABNORMAL LOW (ref >60)
Glucose, Bld: 257 mg/dL — ABNORMAL HIGH (ref 70–99)
Potassium: 5.1 mmol/L (ref 3.5–5.1)
Sodium: 136 mmol/L (ref 135–145)

## 2023-04-30 LAB — TROPONIN I (HIGH SENSITIVITY): Troponin I (High Sensitivity): 8 ng/L (ref <18)

## 2023-04-30 LAB — GLUCOSE, CAPILLARY
Glucose-Capillary: 242 mg/dL — ABNORMAL HIGH (ref 70–99)
Glucose-Capillary: 276 mg/dL — ABNORMAL HIGH (ref 70–99)
Glucose-Capillary: 190 mg/dL — ABNORMAL HIGH (ref 70–99)
Glucose-Capillary: 255 mg/dL — ABNORMAL HIGH (ref 70–99)

## 2023-04-30 MED ORDER — INSULIN GLARGINE-YFGN 100 UNIT/ML ~~LOC~~ SOLN
35.0000 [IU] | Freq: Two times a day (BID) | SUBCUTANEOUS | Status: DC
  Administered 2023-04-30 – 2023-05-03 (×6): 35 [IU] via SUBCUTANEOUS
  Filled 2023-04-30 (×7): qty 0.35

## 2023-04-30 MED ORDER — TORSEMIDE 20 MG PO TABS
60.0000 mg | ORAL_TABLET | Freq: Every day | ORAL | Status: DC
  Administered 2023-04-30: 60 mg via ORAL
  Filled 2023-04-30: qty 3

## 2023-04-30 MED ORDER — NITROGLYCERIN 0.4 MG SL SUBL: 0.4000 mg | SUBLINGUAL_TABLET | SUBLINGUAL | Status: DC | PRN

## 2023-04-30 NOTE — Progress Notes (Addendum)
Nephrology Follow-Up Consult note   Assessment/Recommendations: Joseph [name] is a/an 52 y.o. male with a past medical history significant for CAD, PAD status post bilateral BKA, morbid obesity, DM2, PE, HFpEF COPD who presents with heart failure exacerbation found to have proteinuria   Proteinuria: UPC found to be 5.9.  Most likely diabetic kidney disease and UACR consistent with this as well.  Plan will be optimization of the medications and follow-up outpatient.  CKD 3b: Baseline creatinine seems to be around 1.5.  We have seen some rising creatinine with optimization of his volume status as well as institution of evidence-based medications.  This is not very surprising.  Hopefully we will see his creatinine stabilize soon -Continue with diuresis as below -Continue to monitor daily Cr, Dose meds for GFR -Monitor Daily I/Os, Daily weight  -Maintain MAP>65 for optimal renal perfusion.  -Avoid nephrotoxic medications including NSAIDs -Use synthetic opioids (Fentanyl/Dilaudid) if needed -Currently no indication for HD -I have arranged outpatient hospital follow-up with our office   CHF exacerbation: Volume status hard to determine given the patient's body habitus.  Responding well to diuretics here.  Significant diuresis with torsemide 60 mg twice daily as well as SGLT2.  Will decrease torsemide to daily and continue SGLT2.  Likely needs continued volume removal with time and likely his creatinine will increase with this volume optimization.  I do not think he needs to remain in the hospital for further diuresis but reasonable to keep him if primary team would like.  I would have a low threshold to make his torsemide twice daily in the outpatient setting.  He likely will need more diuretic outside the hospital given his salt intake is higher at home.   Hypertension: Blood pressure lower today with diuresis.  Stop amlodipine and continue carvedilol.   Uncontrolled Diabetes Mellitus Type 2 with  Hyperglycemia: Management per primary team   Recommendations conveyed to primary service.    Darnell Level [facility] 04/30/2023 10:33 AM  ___________________________________________________________  CC: Volume overload  Interval History/Subjective: Patient had excellent diuresis with torsemide yesterday with 2.8 L made.  Creatinine slightly increased to 2.1 from 1.9.  Notably he has seen bleeding from his scrotum related to significant swelling and skin breakdown.  Patient has no other complaints today.   Medications:  Current Facility-Administered Medications  Medication Dose Route Frequency Provider Last Rate Last Admin   0.9 %  sodium chloride infusion  250 mL Intravenous PRN Alfredo Martinez, MD       acetaminophen (TYLENOL) tablet 650 mg  650 mg Oral Q4H PRN Alfredo Martinez, MD   650 mg at 04/29/23 1735   albuterol (PROVENTIL) (2.5 MG/3ML) 0.083% nebulizer solution 2.5 mg  2.5 mg Nebulization Q6H PRN Rejeana Brock, MD       ALPRAZolam Prudy Feeler) tablet 1-2 mg  1-2 mg Oral QHS PRN Alfredo Martinez, MD   1 mg at 04/28/23 2308   aspirin EC tablet 81 mg  81 mg Oral q AM Alfredo Martinez, MD   81 mg at 04/30/23 0843   atorvastatin (LIPITOR) tablet 80 mg  80 mg Oral q1800 Alfredo Martinez, MD   80 mg at 04/29/23 1708   buPROPion (WELLBUTRIN XL) 24 hr tablet 150 mg  150 mg Oral Daily Alfredo Martinez, MD   150 mg at 04/30/23 0843   carvedilol (COREG) tablet 12.5 mg  12.5 mg Oral BID WC Alfredo Martinez, MD   12.5 mg at 04/30/23 0842   clopidogrel (PLAVIX) tablet 75 mg  75 mg Oral Q breakfast Alfredo Martinez, MD   75 mg at 04/30/23 0843   dapagliflozin propanediol (FARXIGA) tablet 10 mg  10 mg Oral Daily Darnell Level, MD   10 mg at 04/30/23 0847   enoxaparin (LOVENOX) injection 80 mg  80 mg Subcutaneous Q24H Alfredo Martinez, MD   80 mg at 04/29/23 1709   escitalopram (LEXAPRO) tablet 10 mg  10 mg Oral q AM Alfredo Martinez, MD   10 mg at 04/30/23 0843   icosapent Ethyl  (VASCEPA) 1 g capsule 2 g  2 g Oral BID Alfredo Martinez, MD   2 g at 04/30/23 0843   insulin aspart (novoLOG) injection 0-15 Units  0-15 Units Subcutaneous TID WC Alfredo Martinez, MD   5 Units at 04/30/23 0839   insulin aspart (novoLOG) injection 0-5 Units  0-5 Units Subcutaneous QHS Lincoln Brigham, MD   3 Units at 04/28/23 2208   insulin glargine-yfgn (SEMGLEE) injection 35 Units  35 Units Subcutaneous BID [name], MD       loratadine (CLARITIN) tablet 10 mg  10 mg Oral Daily Lincoln Brigham, MD   10 mg at 04/30/23 7829   oxyCODONE (Oxy IR/ROXICODONE) immediate release tablet 15 mg  15 mg Oral Q4H PRN Alfredo Martinez, MD   15 mg at 04/30/23 0858   pantoprazole (PROTONIX) EC tablet 40 mg  40 mg Oral Daily Alfredo Martinez, MD   40 mg at 04/30/23 0843   polyethylene glycol (MIRALAX / GLYCOLAX) packet 17 g  17 g Oral Daily [name], MD       prednisoLONE acetate (PRED FORTE) 1 % ophthalmic suspension 1 drop  1 drop Left Eye QID Alfredo Martinez, MD   1 drop at 04/30/23 0845   pregabalin (LYRICA) capsule 150 mg  150 mg Oral QHS Maxwell, Allee, MD   150 mg at 04/29/23 2150   pregabalin (LYRICA) capsule 300 mg  300 mg Oral Daily Alfredo Martinez, MD   300 mg at 04/30/23 0845   ranolazine (RANEXA) 12 hr tablet 500 mg  500 mg Oral BID Alfredo Martinez, MD   500 mg at 04/30/23 0845   sodium chloride flush (NS) 0.9 % injection 3 mL  3 mL Intravenous Q12H Maxwell, Allee, MD   3 mL at 04/30/23 0849   sodium chloride flush (NS) 0.9 % injection 3 mL  3 mL Intravenous PRN Alfredo Martinez, MD       torsemide (DEMADEX) tablet 60 mg  60 mg Oral Daily Darnell Level, MD   60 mg at 04/30/23 0847      Review of Systems: 10 systems reviewed and negative except per interval history/subjective  Physical Exam: Vitals:   04/29/23 2034 04/30/23 0500  BP: 122/82 96/66  Pulse: 81 80  Resp: 18   Temp: 97.6 F (36.4 C) (!) 97.4 F (36.3 C)  SpO2: 92% 91%   No intake/output data recorded.  Intake/Output  Summary (Last 24 hours) at 04/30/2023 1033 Last data filed at 04/30/2023 0600 Gross per 24 hour  Intake 360 ml  Output 2500 ml  Net -2140 ml   Constitutional: Obese, well-appearing, no acute distress ENMT: ears and nose without scars or lesions, MMM CV: normal rate, 2+ edema in the bilateral thighs Respiratory: Bilateral chest rise, normal work of breathing Gastrointestinal: soft, non-tender, no palpable masses or hernias Skin: no visible lesions or rashes Psych: alert, judgement/insight appropriate, appropriate mood and affect   Test Results I personally reviewed new and old clinical labs and radiology  tests Lab Results  Component Value Date   NA 136 04/30/2023   K 5.1 04/30/2023   CL 101 04/30/2023   CO2 28 04/30/2023   BUN 50 (H) 04/30/2023   CREATININE 2.09 (H) 04/30/2023   GFR 78.79 05/28/2015   CALCIUM 8.5 (L) 04/30/2023   ALBUMIN 2.2 (L) 04/27/2023   PHOS 2.4 (L) 07/04/2022    CBC Recent Labs  Lab 04/26/23 1212  WBC 6.7  HGB 12.4*  HCT 40.9  MCV 79.9*  PLT 261

## 2023-04-30 NOTE — Progress Notes (Cosign Needed Addendum)
Daily Progress Note Intern Pager: [phone]  Patient name: Joseph [name] Medical record number: [id number] Date of birth: [birth year] Age: 52 y.o. Gender: male  Primary Care Provider: Marva Panda, NP Consultants: Cardiology, Nephrology Code Status: Full code   Pt Overview and Major Events to Date:  5/7: Admitted to FMTS 5/8: Nephrology consulted   Assessment and Plan: Joseph [name] is a 52 y.o. male presenting with hypervolemia in the setting of HF exacerbation with concurrent AKI on CKD.  PMHx includes T2DM, bilateral BKA d/t diabetic foot, HLD, CAD s/p CABG, PE, CHF, PAD, aortic aneurysm, COPD, anxiety, GERD, HTN, and OSA.  * Nephrosis PCR ~3279, significantly elevated. Per Nephro continue with diuresis and SGLT2 for management. Will likely need additional diuresis outpatient when back on home diet.  -Nephrology following -Continue Marcelline Deist 10mg  daily -Pending SPEP  Acute renal failure superimposed on stage 3a chronic kidney disease (HCC) Cr again increased to 2.09, expected as part of the diuresis process but will need to reduce Torsemide dosing. -Nephrology following, appreciate recs -Reduce to Torsemide 60mg  daily -Avoid nephrotoxic agents  Heart failure with preserved ejection fraction (HCC) ~2.7 UOP recorded yesterday. Continues to diuresis well. Non-pitting edema most likely secondary to nephrotic syndrome and not HF exacerbation. -Diuresis as above -Continue home Coreg 12.5mg  BID  History of pulmonary embolus (PE) Per Cardiology, patient likely needs outpatient hematology follow up to discuss anticoagulation for multiple prior PEs.  Below-knee amputation of left lower extremity (HCC) Clean dry bandage. Continue wound care.  Unstable angina (HCC) Reported episode of chest pain overnight last 30 minutes. Reports it went away but concerned given h/o CABG. -EKG -Troponin  Insulin dependent type 2 diabetes mellitus (HCC) BGL continues to be around  200. -Increase to Semglee 35U BID -mSSI with CBG checks   Chronic stable conditions: Anxiety: continue Xanax 2mg  daily, Bupriprion 150mg  daily, Escitalopram 10mg  daily HLD: continue Atorvastatin 80mg  daily PAD: continue ASA and Plavix GERD: continue pantoprazole 40mg   Neuropathy: continue Pregabalin 150mg  TID OSA: CPAP nightly HTN: continue Amlodipine Unstable angina: continue Ranolazine 150mg  BID   FEN/GI: Heart healthy VTE Prophylaxis: Lovenox Dispo: Home likely today  Subjective:  Patient assessed at bedside, sitting at edge of bed. Reports swelling still feels the same. Still having good UOP. Reports chest pain overnight that lasted about 30 minutes that worried him but it went away.  Objective: Temp:  [97.4 F (36.3 C)-98.2 F (36.8 C)] 97.4 F (36.3 C) (05/11 0500) Pulse Rate:  [80-81] 80 (05/11 0500) Resp:  [18] 18 (05/10 2034) BP: (96-129)/(66-82) 96/66 (05/11 0500) SpO2:  [91 %-94 %] 91 % (05/11 0500) Physical Exam: General: Sitting up at edge of bed, mildly disheveled Cardiovascular: RRR without murmur Respiratory: CTAB. Normal WOB on RA Abdomen: Large pannus, soft, non-tender Extremities: Unchanged non-pitting edema of upper and lower extremities  Laboratory: Most recent CBC Lab Results  Component Value Date   WBC 6.7 04/26/2023   HGB 12.4 (L) 04/26/2023   HCT 40.9 04/26/2023   MCV 79.9 (L) 04/26/2023   PLT 261 04/26/2023   Most recent BMP    Latest Ref Rng & Units 04/30/2023    1:34 AM  BMP  Glucose 70 - 99 mg/dL 284   BUN 6 - 20 mg/dL 50   Creatinine 1.32 - 1.24 mg/dL 4.40   Sodium 102 - 725 mmol/L 136   Potassium 3.5 - 5.1 mmol/L 5.1   Chloride 98 - 111 mmol/L 101   CO2 22 - 32 mmol/L 28  Calcium 8.9 - 10.3 mg/dL 8.5     Other pertinent labs: Glu: 242   [name], MD 04/30/2023, 10:55 AM  PGY-1, [facility] FPTS Intern pager: [phone], text pages welcome Secure chat group [facility] Teaching  Service

## 2023-04-30 NOTE — Assessment & Plan Note (Signed)
Reported episode of chest pain overnight last 30 minutes. Reports it went away but concerned given h/o CABG. -EKG -Troponin

## 2023-04-30 NOTE — Plan of Care (Signed)
  Problem: Education: Goal: Ability to describe self-care measures that may prevent or decrease complications (Diabetes Survival Skills Education) will improve Outcome: Progressing Goal: Individualized Educational Video(s) Outcome: Progressing   Problem: Coping: Goal: Ability to adjust to condition or change in health will improve Outcome: Progressing   Problem: Fluid Volume: Goal: Ability to maintain a balanced intake and output will improve Outcome: Progressing   Problem: Health Behavior/Discharge Planning: Goal: Ability to identify and utilize available resources and services will improve Outcome: Progressing Goal: Ability to manage health-related needs will improve Outcome: Progressing   Problem: Metabolic: Goal: Ability to maintain appropriate glucose levels will improve Outcome: Progressing   Problem: Nutritional: Goal: Maintenance of adequate nutrition will improve Outcome: Progressing Goal: Progress toward achieving an optimal weight will improve Outcome: Progressing   Problem: Skin Integrity: Goal: Risk for impaired skin integrity will decrease Outcome: Progressing   Problem: Tissue Perfusion: Goal: Adequacy of tissue perfusion will improve Outcome: Progressing   Problem: Education: Goal: Ability to demonstrate management of disease process will improve Outcome: Progressing Goal: Ability to verbalize understanding of medication therapies will improve Outcome: Progressing Goal: Individualized Educational Video(s) Outcome: Progressing   Problem: Activity: Goal: Capacity to carry out activities will improve Outcome: Progressing   Problem: Cardiac: Goal: Ability to achieve and maintain adequate cardiopulmonary perfusion will improve Outcome: Progressing   Problem: Education: Goal: Knowledge of General Education information will improve Description: Including pain rating scale, medication(s)/side effects and non-pharmacologic comfort measures Outcome:  Progressing   Problem: Health Behavior/Discharge Planning: Goal: Ability to manage health-related needs will improve Outcome: Progressing   Problem: Clinical Measurements: Goal: Ability to maintain clinical measurements within normal limits will improve Outcome: Progressing Goal: Will remain free from infection Outcome: Progressing Goal: Diagnostic test results will improve Outcome: Progressing Goal: Respiratory complications will improve Outcome: Progressing Goal: Cardiovascular complication will be avoided Outcome: Progressing   Problem: Activity: Goal: Risk for activity intolerance will decrease Outcome: Progressing   Problem: Nutrition: Goal: Adequate nutrition will be maintained Outcome: Progressing   Problem: Coping: Goal: Level of anxiety will decrease Outcome: Progressing   Problem: Elimination: Goal: Will not experience complications related to bowel motility Outcome: Progressing Goal: Will not experience complications related to urinary retention Outcome: Progressing   Problem: Pain Managment: Goal: General experience of comfort will improve Outcome: Progressing   Problem: Safety: Goal: Ability to remain free from injury will improve Outcome: Progressing   Problem: Skin Integrity: Goal: Risk for impaired skin integrity will decrease Outcome: Progressing   

## 2023-05-01 ENCOUNTER — Inpatient Hospital Stay (HOSPITAL_COMMUNITY): Payer: Medicare HMO

## 2023-05-01 DIAGNOSIS — I5033 Acute on chronic diastolic (congestive) heart failure: Secondary | ICD-10-CM | POA: Diagnosis not present

## 2023-05-01 DIAGNOSIS — N049 Nephrotic syndrome with unspecified morphologic changes: Secondary | ICD-10-CM | POA: Diagnosis not present

## 2023-05-01 DIAGNOSIS — R079 Chest pain, unspecified: Secondary | ICD-10-CM

## 2023-05-01 LAB — GLUCOSE, CAPILLARY
Glucose-Capillary: 210 mg/dL — ABNORMAL HIGH (ref 70–99)
Glucose-Capillary: 261 mg/dL — ABNORMAL HIGH (ref 70–99)
Glucose-Capillary: 209 mg/dL — ABNORMAL HIGH (ref 70–99)
Glucose-Capillary: 154 mg/dL — ABNORMAL HIGH (ref 70–99)

## 2023-05-01 LAB — BASIC METABOLIC PANEL
Anion gap: 8 (ref 5–15)
BUN: 54 mg/dL — ABNORMAL HIGH (ref 6–20)
CO2: 28 mmol/L (ref 22–32)
Calcium: 8.3 mg/dL — ABNORMAL LOW (ref 8.9–10.3)
Chloride: 100 mmol/L (ref 98–111)
Creatinine, Ser: 2.24 mg/dL — ABNORMAL HIGH (ref 0.61–1.24)
GFR, Estimated: 35 mL/min — ABNORMAL LOW (ref >60)
Glucose, Bld: 207 mg/dL — ABNORMAL HIGH (ref 70–99)
Potassium: 4.4 mmol/L (ref 3.5–5.1)
Sodium: 136 mmol/L (ref 135–145)

## 2023-05-01 LAB — D-DIMER, QUANTITATIVE: D-Dimer, Quant: 0.5 ug/mL-FEU (ref 0.00–0.50)

## 2023-05-01 MED ORDER — PANTOPRAZOLE SODIUM 40 MG PO TBEC
40.0000 mg | DELAYED_RELEASE_TABLET | Freq: Two times a day (BID) | ORAL | Status: DC
  Administered 2023-05-01 – 2023-05-03 (×4): 40 mg via ORAL
  Filled 2023-05-01 (×4): qty 1

## 2023-05-01 MED ORDER — DILTIAZEM HCL 30 MG PO TABS
30.0000 mg | ORAL_TABLET | Freq: Four times a day (QID) | ORAL | Status: DC
  Administered 2023-05-01 – 2023-05-03 (×7): 30 mg via ORAL
  Filled 2023-05-01 (×9): qty 1

## 2023-05-01 MED ORDER — TORSEMIDE 20 MG PO TABS: 60.0000 mg | ORAL_TABLET | Freq: Every day | ORAL | Status: DC

## 2023-05-01 MED ORDER — CARVEDILOL 3.125 MG PO TABS
3.1250 mg | ORAL_TABLET | Freq: Two times a day (BID) | ORAL | Status: DC
  Administered 2023-05-02 – 2023-05-03 (×3): 3.125 mg via ORAL
  Filled 2023-05-01 (×3): qty 1

## 2023-05-01 MED ORDER — TORSEMIDE 20 MG PO TABS
60.0000 mg | ORAL_TABLET | Freq: Every day | ORAL | Status: DC
  Administered 2023-05-01 – 2023-05-03 (×3): 60 mg via ORAL
  Filled 2023-05-01 (×3): qty 3

## 2023-05-01 NOTE — Progress Notes (Addendum)
Daily Progress Note Intern Pager: [phone]  Patient name: Joseph [name] Maharaj Medical record number: [id number] Date of birth: 25-Oct-1971 Age: 52 y.o. Gender: male  Primary Care Provider: Marva Panda, NP Consultants: Cardiology, Nephrology Code Status: FULL  Pt Overview and Major Events to Date:  5/7: Admitted 5/8: Nephrology consulted  Assessment and Plan: Joseph [name] is a 52 y.o. male presenting with hypervolemia in the setting of HF exacerbation with concurrent AKI on CKD.  PMHx includes T2DM, bilateral BKA d/t diabetic foot, HLD, CAD s/p CABG, PE, CHF, PAD, aortic aneurysm, COPD, anxiety, GERD, HTN, and OSA.  * Nephrosis PCR ~3279, significantly elevated. Per Nephro continue with diuresis and SGLT2 for management. Will likely need additional diuresis outpatient when back on home diet.  -Nephrology following -Continue Marcelline Deist 10mg  daily -Pending SPEP  Acute renal failure superimposed on stage 3a chronic kidney disease (HCC) Has yet to stabilize Cr, increased yet again to 2.24 despite backing off on diuresis. -Nephrology following, appreciate recs -Continue Torsemide 60mg  daily -Avoid nephrotoxic agents  Heart failure with preserved ejection fraction (HCC) ~3.55 LUOP recorded in past 24 hours. Continues to diuresis well. Non-pitting edema most likely secondary to nephrotic syndrome and not HF exacerbation. -Diuresis as above -Continue home Coreg 12.5mg  BID  CAD (coronary artery disease) Significant cardiac history. Continues to have chest pain today.  Troponins last night were reassuring and flat.  Hemodynamically stable with normal vital signs and physical exam from cardiac standpoint -Ordered D-dimer and chest x-ray -Consulted [facility] heart care today, as he was sent over from cardiologist office.  They will see him and follow while inpatient Will await further recommendations  History of pulmonary embolus (PE) Per Cardiology, patient likely needs outpatient  hematology follow up to discuss anticoagulation for multiple prior PEs.  Below-knee amputation of left lower extremity (HCC) Clean dry bandage. Continue wound care.  Insulin dependent type 2 diabetes mellitus (HCC) BGL continues to be around 200. -Continue Semglee 35u BID -mSSI with CBG checks    Chronic stable conditions: Anxiety: continue Xanax 1-2mg  at night PRN, Bupriprion 150mg  daily, Escitalopram 10mg  daily HLD: continue Atorvastatin 80mg  daily PAD: continue ASA and Plavix GERD: continue pantoprazole 40mg   Neuropathy: continue Pregabalin 150mg  TID OSA: CPAP nightly HTN: HOLD Amlodipine (soft pressures lately) Unstable angina: continue Ranolazine 150mg  BID   FEN/GI: Heart healthy PPx: Lovenox Dispo:Home pending clinical improvement .   Subjective:  Patient reporting intermittent chest pain this morning.  No radiation, nausea or vomiting. Eating his full breakfast of pancakes while we were talking. Says that he still feels volume overloaded.  Making a lot of urine. Asks me "are my kidney shot?"  and says he would like to remain in hospital for the next few days so he does not have to come back.  Objective: Temp:  [97.9 F (36.6 C)-98.2 F (36.8 C)] 97.9 F (36.6 C) (05/12 0418) Pulse Rate:  [78-88] 82 (05/12 0418) Resp:  [16-18] 18 (05/12 0418) BP: (91-102)/(51-74) 93/74 (05/12 0418) SpO2:  [93 %-94 %] 93 % (05/12 0418) Physical Exam: General: Obese, sitting upright in the bed eating breakfast Cardiovascular: Regular rate and rhythm Respiratory: Normal work of breathing on room air with clear lung sounds Abdomen: Obese, large pannus, nontender nondistended Extremities: S/p BKA, some nonpitting edema  Laboratory: Most recent CBC Lab Results  Component Value Date   WBC 6.7 04/26/2023   HGB 12.4 (L) 04/26/2023   HCT 40.9 04/26/2023   MCV 79.9 (L) 04/26/2023   PLT 261  04/26/2023   Most recent BMP    Latest Ref Rng & Units 05/01/2023    2:42 AM  BMP   Glucose 70 - 99 mg/dL 161   BUN 6 - 20 mg/dL 54   Creatinine 0.96 - 1.24 mg/dL 0.45   Sodium 409 - 811 mmol/L 136   Potassium 3.5 - 5.1 mmol/L 4.4   Chloride 98 - 111 mmol/L 100   CO2 22 - 32 mmol/L 28   Calcium 8.9 - 10.3 mg/dL 8.3      Darral Dash, DO 05/01/2023, 9:16 AM  PGY-2, [place] FPTS Intern pager: [phone], text pages welcome Secure chat group [facility] Teaching Service

## 2023-05-01 NOTE — Assessment & Plan Note (Deleted)
-  Ordered D-dimer and chest x-ray -Consulted [facility] care today, as he was sent over from cardiologist office.  They will see him and follow while inpatient Will await further recommendations

## 2023-05-01 NOTE — Consult Note (Signed)
Cardiology Consultation   Patient ID: Joseph [name] MRN: [id number]; DOB: [birth year]  Admit date: 04/26/2023 Date of Consult: 05/01/2023  PCP:  Joseph Panda, NP   [facility] Providers Cardiologist:  Chilton Si, MD   {    Patient Profile:   Joseph [name] is a 52 y.o. male with a hx of morbid obesity, chronic HFpEF, CAD s/p PCI to Lcx 2017 and CABG 2021, ascending aortic aneurysm by echo, HTN, bicuspid aortic valve, PAD,bilateral BKA secondary to uncontrolled DM2, PE 2014, COPD, CKDIIIa, OSA who is being seen 05/01/2023 for the evaluation of heart failure exacerbation at the request of Dr. Deirdre Priest.  History of Present Illness:   Joseph [name] patient has significant cardiac history as above with PCI in 2017 to left circumflex artery and CABG in 2021 with repeat catheterizations in 2022 and 2023. His most recent catheterization in 05/2022 showed patent bypass grafts and results as follows: severe native CAD with 80% distal left main stenosis and widely patent LIMA grafts. Native circumflex was totally occluded with widely patent graft supplying the distal OM. Native dominant RCA has 80% focal proximal stenosis with mild in-stent restenosis in the previously placed mid stent with 50% narrowing at the ostium of the PDA with widely patent SVG to PDA.  Given his significant CAD history he is on long-term DAPT.  During his most recent visit for preoperative clearance for macular degeneration on Apr 25, 2023 he had noted some anginal symptoms that was felt to be related to volume overload. It was also not felt that he had any response on torsemide so that was discontinued and lasix 120 mg in the morning and 80 mg in the afternoon was continued.  Follow-up echocardiogram on Apr 27, 2023 indicated normal LVEF of 55 to 60% with grade 2 diastolic dysfunction.  After cardiac evaluation, patient had abnormal outpatient labs indicating hypervolemia with an elevated BNP of 375 and  diminished renal function.  Potassium 5.9, creatinine 1.4.  He was then recommended for admission for diuresis and monitoring of his renal function.  Patient was admitted on 04/26/2023 and was started on IV Lasix 80 mg with good urinary output.  Given his significant edema there was some concern of nephrotic syndrome so nephrology was consulted. Nephrology had transitioned him from IV diuretics to torsemide 60 twice daily and had added an SGLT2 inhibitor.  Now patient has been titrated down to torsemide 60 mg daily.  Renal function has steadily increased from 1.7 on admission to now 2.54.  No need for HD at this point per nephro.  Patient still has had some nonpitting edema that was thought to be attributed to nephrosis rather than heart failure exacerbation.    After discussing with patient, he has been complaining of progressive and intermittent chest pain throughout this admission that got worse yesterday.  He states that he has had intermittent chest pain since his heart catheterization last year, but now is having more frequent episodes that feels like "somebody standing on his chest."  He states that this occurs around 6 times each day.  Sometimes this pain is accompanied by shortness of breath, nausea, palpitations, radiation to the right side of his neck.  EKG showing NSR without STTW changes. Troponins are reassuring and negative.  Patient states that he is very compliant with his medication and has not missed any doses of his DAPT. In addition, despite good output on diuretics, he states that he actually feels that his swelling has gotten worse  throughout this admission and has not had any significant improvement in symptoms. He still feels that he is extremely weak and has significant orthopnea and unable to lay back.  Blood pressure has also remained to be borderline hypotensive.  Past Medical History:  Diagnosis Date   Aneurysm of ascending aorta (HCC) 09/14/2021   Anxiety    Arthritis     "knees, shoulders, hips, ankles" (07/29/2016)   Asthma    Bicuspid aortic valve 09/14/2021   CAD (coronary artery disease)    a. 2017: s/p BMS to distal Cx; b. LHC 05/30/2019: 80% mid, distal RCA s/p DES, 30% narrowing of d LM, widely patent LAD w/ luminal irregularities, widely patent stent in dCX  w/ 90+% stenosis distal to stent beofre small trifurcating obtuse marginal (potentially area of restenosis)   Chronic bronchitis (HCC)    Chronic ulcer of great toe of right foot (HCC) 09/09/2017   Chronic ulcer of right great toe (HCC) 09/19/2017   COPD (chronic obstructive pulmonary disease) (HCC)    Coronary arteriosclerosis    Depression    Diabetes mellitus (HCC)    DVT (deep venous thrombosis) (HCC)    GERD (gastroesophageal reflux disease)    Hyperlipidemia    Hypertension    Migraine    Morbid obesity (HCC)    Myocardial infarction (HCC) 1996   "light one"   PE (pulmonary embolism) 04/2013   On chronic Xarelto   Peripheral nerve disease    Pneumonia "several times"   Sleep apnea    Type II diabetes mellitus (HCC)     Past Surgical History:  Procedure Laterality Date   ABDOMINAL AORTOGRAM W/LOWER EXTREMITY N/A 10/10/2020   Procedure: ABDOMINAL AORTOGRAM W/LOWER EXTREMITY;  Surgeon: Sherren Kerns, MD;  Location: [facility];  Service: Cardiovascular;  Laterality: N/A;   AMPUTATION Right 09/21/2017   Procedure: RIGHT GREAT TOE AMPUTATION, POSSIBLE VAC;  Surgeon: Tarry Kos, MD;  Location: [facility] OR;  Service: Orthopedics;  Laterality: Right;   AMPUTATION Left 08/13/2020   Procedure: LEFT FOOT 4TH RAY AMPUTATION;  Surgeon: [name], MD;  Location: [facility] OR;  Service: Orthopedics;  Laterality: Left;   AMPUTATION Left 11/20/2021   Procedure: LEFT TRANSMETATARSAL AMPUTATION;  Surgeon: [name], MD;  Location: [facility] OR;  Service: Orthopedics;  Laterality: Left;   AMPUTATION Left 01/08/2022   Procedure: LEFT BELOW KNEE AMPUTATION;  Surgeon: [name], MD;  Location: [facility]  OR;  Service: Orthopedics;  Laterality: Left;   AMPUTATION Right 07/07/2022   Procedure: RIGHT BELOW KNEE AMPUTATION;  Surgeon: [name], MD;  Location: [facility] OR;  Service: Orthopedics;  Laterality: Right;   AORTOGRAM Bilateral 03/13/2021   Procedure: ABDOMINAL AORTOGRAM WITH Left LOWER EXTREMITY RUNOFF;  Surgeon: Leonie Douglas, MD;  Location: [facility] OR;  Service: Vascular;  Laterality: Bilateral;   APPLICATION OF WOUND VAC  01/08/2022   Procedure: APPLICATION OF WOUND VAC;  Surgeon: [name], MD;  Location: [facility] OR;  Service: Orthopedics;;   APPLICATION OF WOUND VAC Right 07/07/2022   Procedure: APPLICATION OF WOUND VAC;  Surgeon: [name], MD;  Location: [facility] OR;  Service: Orthopedics;  Laterality: Right;   CARDIAC CATHETERIZATION  2006   CARDIAC CATHETERIZATION  1996   "@ [facility]; when I had my heart attack"   CARDIAC CATHETERIZATION N/A 07/29/2016   Procedure: Left Heart Cath and Coronary Angiography;  Surgeon: Runell Gess, MD;  Location: [facility] INVASIVE CV LAB;  Service: Cardiovascular;  Laterality: N/A;  CARDIAC CATHETERIZATION N/A 07/29/2016   Procedure: Coronary Stent Intervention;  Surgeon: Runell Gess, MD;  Location: [facility] INVASIVE CV LAB;  Service: Cardiovascular;  Laterality: N/A;   CARPAL TUNNEL RELEASE Bilateral    CORONARY ANGIOPLASTY WITH STENT PLACEMENT  07/29/2016   CORONARY ARTERY BYPASS GRAFT N/A 07/24/2020   Procedure: CORONARY ARTERY BYPASS GRAFTING (CABG), ON PUMP, TIMES FOUR, USING LEFT INTERNAL MAMMARY ARTERY AND ENDOSCOPICALLY HARVESTED RIGHT GREATER SAPHENOUS VEIN;  Surgeon: Corliss Skains, MD;  Location: [facility] OR;  Service: Open Heart Surgery;  Laterality: N/A;  FLOW TAC   CORONARY PRESSURE/FFR STUDY N/A 07/22/2020   Procedure: INTRAVASCULAR PRESSURE WIRE/FFR STUDY;  Surgeon: Marykay Lex, MD;  Location: [facility] INVASIVE CV LAB;  Service: Cardiovascular;  Laterality: N/A;   CORONARY STENT INTERVENTION N/A 05/30/2019   Procedure: CORONARY STENT INTERVENTION;   Surgeon: Lyn Records, MD;  Location: [facility];  Service: Cardiovascular;  Laterality: N/A;   CORONARY STENT INTERVENTION N/A 11/02/2019   Procedure: CORONARY STENT INTERVENTION;  Surgeon: Iran Ouch, MD;  Location: [facility];  Service: Cardiovascular;  Laterality: N/A;   ESOPHAGOGASTRODUODENOSCOPY N/A 09/22/2017   Procedure: ESOPHAGOGASTRODUODENOSCOPY (EGD);  Surgeon: Meryl Dare, MD;  Location: [facility] ENDOSCOPY;  Service: Endoscopy;  Laterality: N/A;   KNEE ARTHROSCOPY Bilateral    "2 on left; 1 on the right"   LEFT HEART CATH AND CORONARY ANGIOGRAPHY N/A 11/02/2019   Procedure: LEFT HEART CATH AND CORONARY ANGIOGRAPHY;  Surgeon: Iran Ouch, MD;  Location: [facility];  Service: Cardiovascular;  Laterality: N/A;   LEFT HEART CATH AND CORONARY ANGIOGRAPHY N/A 07/22/2020   Procedure: LEFT HEART CATH AND CORONARY ANGIOGRAPHY;  Surgeon: Marykay Lex, MD;  Location: [facility] INVASIVE CV LAB;  Service: Cardiovascular;  Laterality: N/A;   LEFT HEART CATH AND CORS/GRAFTS ANGIOGRAPHY N/A 08/17/2021   Procedure: LEFT HEART CATH AND CORS/GRAFTS ANGIOGRAPHY;  Surgeon: Yvonne Kendall, MD;  Location: [facility];  Service: Cardiovascular;  Laterality: N/A;   PERIPHERAL VASCULAR INTERVENTION Left 10/11/2020   popliteal and SFA stent placement    PERIPHERAL VASCULAR INTERVENTION Left 10/10/2020   Procedure: PERIPHERAL VASCULAR INTERVENTION;  Surgeon: Sherren Kerns, MD;  [facility]: [facility];  Service: Cardiovascular;  Laterality: Left;   RIGHT/LEFT HEART CATH AND CORONARY ANGIOGRAPHY N/A 05/30/2019   Procedure: RIGHT/LEFT HEART CATH AND CORONARY ANGIOGRAPHY;  Surgeon: Lyn Records, MD;  [facility]: [facility];  Service: Cardiovascular;  Laterality: N/A;   RIGHT/LEFT HEART CATH AND CORONARY/GRAFT ANGIOGRAPHY N/A 06/17/2022   Procedure: RIGHT/LEFT HEART CATH AND CORONARY/GRAFT ANGIOGRAPHY;  Surgeon: Lennette Bihari, MD;  [facility]: [facility]  [facility];  Service: Cardiovascular;  Laterality: N/A;   SHOULDER OPEN ROTATOR CUFF REPAIR Bilateral    TEE WITHOUT CARDIOVERSION N/A 07/24/2020   Procedure: TRANSESOPHAGEAL ECHOCARDIOGRAM (TEE);  Surgeon: Corliss Skains, MD;  Location: [facility] OR;  Service: Open Heart Surgery;  Laterality: N/A;    Inpatient Medications: Scheduled Meds:  aspirin EC  81 mg Oral q AM   atorvastatin  80 mg Oral q1800   buPROPion  150 mg Oral Daily   carvedilol  12.5 mg Oral BID WC   clopidogrel  75 mg Oral Q breakfast   dapagliflozin propanediol  10 mg Oral Daily   enoxaparin (LOVENOX) injection  80 mg Subcutaneous Q24H   escitalopram  10 mg Oral q AM   icosapent Ethyl  2 g Oral BID   insulin aspart  0-15 Units Subcutaneous TID  WC   insulin aspart  0-5 Units Subcutaneous QHS   insulin glargine-yfgn  35 Units Subcutaneous BID   loratadine  10 mg Oral Daily   pantoprazole  40 mg Oral BID   polyethylene glycol  17 g Oral Daily   prednisoLONE acetate  1 drop Left Eye QID   pregabalin  150 mg Oral QHS   pregabalin  300 mg Oral Daily   ranolazine  500 mg Oral BID   sodium chloride flush  3 mL Intravenous Q12H   torsemide  60 mg Oral Daily   Continuous Infusions:  sodium chloride     PRN Meds: sodium chloride, acetaminophen, albuterol, ALPRAZolam, nitroGLYCERIN, oxyCODONE, sodium chloride flush  Allergies:    Allergies  Allergen Reactions   Sulfa Antibiotics Anaphylaxis and Other (See Comments)    Headaches     Social History:   Social History   Socioeconomic History   Marital status: Married    Spouse name: Not on file   Number of children: Not on file   Years of education: Not on file   Highest education level: Not on file  Occupational History   Occupation: disabled     Employer: UNEMPLOYED  Tobacco Use   Smoking status: Former    Packs/day: 1.00    Years: 34.00    Additional pack years: 0.00    Total pack years: 34.00    Types: Cigarettes    Quit date: 07/12/2020    Years since  quitting: 2.8   Smokeless tobacco: Former    Types: Snuff, Chew  Vaping Use   Vaping Use: Never used  Substance and Sexual Activity   Alcohol use: Yes    Comment: RARE   Drug use: No   Sexual activity: Yes  Other Topics Concern   Not on file  Social History Narrative   Not on file   Social Determinants of Health   Financial Resource Strain: Medium Risk (09/09/2022)   Overall Financial Resource Strain (CARDIA)    Difficulty of Paying Living Expenses: Somewhat hard  Food Insecurity: No Food Insecurity (09/09/2022)   Hunger Vital Sign    Worried About Running Out of Food in the Last Year: Never true    Ran Out of Food in the Last Year: Never true  Transportation Needs: Unmet Transportation Needs (09/09/2022)   PRAPARE - Administrator, Civil Service (Medical): Yes    Lack of Transportation (Non-Medical): Yes  Physical Activity: Not on file  Stress: Not on file  Social Connections: Not on file  Intimate Partner Violence: Not on file    Family History:   Family History  Problem Relation Age of Onset   Hypertension Brother    Hypertension Father    Diabetes Other    Hyperlipidemia Other      ROS:  Please see the history of present illness.  All other ROS reviewed and negative.     Physical Exam/Data:   Vitals:   04/30/23 1058 04/30/23 1620 04/30/23 2128 05/01/23 0418  BP: 102/62 101/67 (!) 91/51 93/74  Pulse: 88 88 78 82  Resp: 18 16 18 18   Temp: 98 F (36.7 C) 98.2 F (36.8 C) 98 F (36.7 C) 97.9 F (36.6 C)  TempSrc: Oral Oral Oral Oral  SpO2: 93% 93% 94% 93%  Weight:        Intake/Output Summary (Last 24 hours) at 05/01/2023 1154 Last data filed at 05/01/2023 0400 Gross per 24 hour  Intake 1074 ml  Output 2650 ml  Net -1576 ml      04/29/2023    4:51 AM 04/28/2023    5:31 AM 04/26/2023    3:34 PM  Last 3 Weights  Weight (lbs) 398 lb 9.5 oz 395 lb 15.1 oz 388 lb 3.7 oz  Weight (kg) 180.8 kg 179.6 kg 176.1 kg     Body mass index is 47.27  kg/m.  General: Morbidly obese HEENT: normal Neck: no JVD Vascular: No carotid bruits; Distal pulses 2+ bilaterally Cardiac:  normal S1, S2; RRR; systolic 3 out of 6 murmur Lungs:  clear to auscultation bilaterally, no wheezing, rhonchi or rales  Abd: soft, nontender, no hepatomegaly  Ext: 1+ nonpitting edema.  Bilateral BKA Musculoskeletal:  BUE and BLE strength normal and equal Skin: warm and dry  Neuro:  CNs 2-12 intact, no focal abnormalities noted Psych:  Normal affect   EKG:  The EKG was personally reviewed and demonstrates:  EKG shows normal sinus rhythm with first-degree AV block.  Left axis deviation.  No ST-T wave changes. Telemetry:  Telemetry was personally reviewed and demonstrates: Normal sinus rhythm heart rate 80. Of note: this was only one strip because patient has not been on tele for at least 2 days now.   Relevant CV Studies: Echocardiogram 04/27/2023 1. Left ventricular ejection fraction, by estimation, is 55 to 60%. The  left ventricle has normal function. The left ventricle has no regional  wall motion abnormalities. There is mild concentric left ventricular  hypertrophy. Left ventricular diastolic  parameters are consistent with Grade II diastolic dysfunction  (pseudonormalization).   2. Right ventricular systolic function is normal. The right ventricular  size is normal. Tricuspid regurgitation signal is inadequate for assessing  PA pressure.   3. The mitral valve is normal in structure. No evidence of mitral valve  regurgitation. No evidence of mitral stenosis.   4. The aortic valve appears functionally bicuspid with fused left and  right coronary cusps. There is moderate calcification of the aortic valve.  Aortic valve regurgitation is not visualized. Aortic valve  sclerosis/calcification is present, without any  evidence of aortic stenosis. Aortic valve mean gradient measures 9.0 mmHg.   5. Aortic dilatation noted. There is moderate dilatation of the  aortic  root, measuring 44 mm. There is moderate dilatation of the ascending  aorta, measuring 45 mm.   6. The inferior vena cava is dilated in size with >50% respiratory  variability, suggesting right atrial pressure of 8 mmHg.   Right and left heart catheterization 06/17/2022 Mid LM to Ost LAD lesion is 80% stenosed with 40% stenosed side branch in Ost Cx to Prox Cx.   Mid Cx-2 lesion is 5% stenosed.   Dist Cx lesion is 70% stenosed.   Mid Cx-1 lesion is 100% stenosed.   Prox RCA to Mid RCA lesion is 30% stenosed.   Dist RCA lesion is 50% stenosed with 50% stenosed side branch in RPDA.   Prox RCA lesion is 80% stenosed.   Prox Graft to Mid Graft lesion is 30% stenosed.   1st Diag lesion is 40% stenosed.   3rd Diag lesion is 60% stenosed.   Mid LAD lesion is 50% stenosed.   SVG and is large.   SVG and is normal in caliber.   SVG and is large.   The graft exhibits no disease.   The graft exhibits no disease.   Severe native CAD with 80% distal left main stenosis with widely patent LIMA graft supplying the LAD and SVG supplying the diagonal  vessel.   The native circumflex is totally occluded with a widely patent vein graft supplying the distal OM vessel.   The native dominant RCA has 80% focal proximal stenosis with mild in-stent restenosis in the previously placed mid stent with 50% narrowing at the ostium of the PDA with widely patent SVG to PDA.   Normal right heart pressures with mean PA pressure at 21 mmHg/   RECOMMENDATION: Medical therapy.  Continue long-term DAPT.  Optimal blood pressure control with target blood pressure less than 130/80.  Aggressive lipid-lowering therapy with target LDL less than 55.  Laboratory Data:  High Sensitivity Troponin:   Recent Labs  Lab 04/26/23 1212 04/26/23 1429 04/30/23 1010  TROPONINIHS 10 9 8      Chemistry Recent Labs  Lab 04/29/23 0047 04/30/23 0134 05/01/23 0242  NA 136 136 136  K 4.8 5.1 4.4  CL 101 101 100  CO2 26 28  28   GLUCOSE 279* 257* 207*  BUN 44* 50* 54*  CREATININE 1.90* 2.09* 2.24*  CALCIUM 8.4* 8.5* 8.3*  GFRNONAA 42* 38* 35*  ANIONGAP 9 7 8     Recent Labs  Lab 04/27/23 1328  ALBUMIN 2.2*   Lipids No results for input(s): "CHOL", "TRIG", "HDL", "LABVLDL", "LDLCALC", "CHOLHDL" in the last 168 hours.  Hematology Recent Labs  Lab 04/26/23 1212  WBC 6.7  RBC 5.12  HGB 12.4*  HCT 40.9  MCV 79.9*  MCH 24.2*  MCHC 30.3  RDW 18.6*  PLT 261   Thyroid No results for input(s): "TSH", "FREET4" in the last 168 hours.  BNP Recent Labs  Lab 04/25/23 1520 04/26/23 1214  BNP  --  137.3*  PROBNP 375*  --     DDimer  Recent Labs  Lab 05/01/23 0925  DDIMER 0.50     Radiology/Studies:  DG CHEST PORT 1 VIEW  Result Date: 05/01/2023 CLINICAL DATA:  Shortness of breath EXAM: PORTABLE CHEST 1 VIEW COMPARISON:  Chest x-ray Apr 26, 2023 FINDINGS: The heart size and mediastinal contours are within normal limits. Both lungs are clear. The visualized skeletal structures are unremarkable. IMPRESSION: No active disease. Electronically Signed   By: Gerome Sam III M.D.   On: 05/01/2023 09:44   ECHOCARDIOGRAM COMPLETE  Result Date: 04/27/2023    ECHOCARDIOGRAM REPORT   Patient Name:   KEYLEN DUFFANY [facility] Date of Exam: 04/27/2023 Medical Rec #:  [id number]       Height:       77.0 in Accession #:    [id number]      Weight:       388.2 lb Date of Birth:  01-06-1971       BSA:          2.965 m Patient Age:    52 years        BP:           125/83 mmHg Patient Gender: M               HR:           82 bpm. Exam Location:  Inpatient Procedure: 2D Echo, Cardiac Doppler, Color Doppler and Intracardiac            Opacification Agent Indications:    R06.02 SOB  History:        Patient has prior history of Echocardiogram examinations, most                 recent 01/07/2022. CAD and Previous Myocardial Infarction, Prior  CABG, COPD, Signs/Symptoms:Chest Pain, Dyspnea, Shortness of                 Breath,  Syncope and Edema; Risk Factors:Hypertension, Sleep                 Apnea, Diabetes and Current Smoker. Pulmonary embolus.  Sonographer:    Sheralyn Boatman RDCS Referring Phys: [id number] CARINA M BROWN  Sonographer Comments: Technically difficult study due to poor echo windows and patient is obese. Image acquisition challenging due to patient body habitus. IMPRESSIONS  1. Left ventricular ejection fraction, by estimation, is 55 to 60%. The left ventricle has normal function. The left ventricle has no regional wall motion abnormalities. There is mild concentric left ventricular hypertrophy. Left ventricular diastolic parameters are consistent with Grade II diastolic dysfunction (pseudonormalization).  2. Right ventricular systolic function is normal. The right ventricular size is normal. Tricuspid regurgitation signal is inadequate for assessing PA pressure.  3. The mitral valve is normal in structure. No evidence of mitral valve regurgitation. No evidence of mitral stenosis.  4. The aortic valve appears functionally bicuspid with fused left and right coronary cusps. There is moderate calcification of the aortic valve. Aortic valve regurgitation is not visualized. Aortic valve sclerosis/calcification is present, without any evidence of aortic stenosis. Aortic valve mean gradient measures 9.0 mmHg.  5. Aortic dilatation noted. There is moderate dilatation of the aortic root, measuring 44 mm. There is moderate dilatation of the ascending aorta, measuring 45 mm.  6. The inferior vena cava is dilated in size with >50% respiratory variability, suggesting right atrial pressure of 8 mmHg. FINDINGS  Left Ventricle: Left ventricular ejection fraction, by estimation, is 55 to 60%. The left ventricle has normal function. The left ventricle has no regional wall motion abnormalities. Definity contrast agent was given IV to delineate the left ventricular  endocardial borders. The left ventricular internal cavity size was normal in size.  There is mild concentric left ventricular hypertrophy. Left ventricular diastolic parameters are consistent with Grade II diastolic dysfunction (pseudonormalization). Right Ventricle: The right ventricular size is normal. No increase in right ventricular wall thickness. Right ventricular systolic function is normal. Tricuspid regurgitation signal is inadequate for assessing PA pressure. Left Atrium: Left atrial size was normal in size. Right Atrium: Right atrial size was normal in size. Pericardium: There is no evidence of pericardial effusion. Mitral Valve: The mitral valve is normal in structure. There is mild calcification of the mitral valve leaflet(s). Mild mitral annular calcification. No evidence of mitral valve regurgitation. No evidence of mitral valve stenosis. MV peak gradient, 6.2 mmHg. The mean mitral valve gradient is 4.0 mmHg. Tricuspid Valve: The tricuspid valve is normal in structure. Tricuspid valve regurgitation is not demonstrated. Aortic Valve: The aortic valve is bicuspid. There is moderate calcification of the aortic valve. Aortic valve regurgitation is not visualized. Aortic valve sclerosis/calcification is present, without any evidence of aortic stenosis. Aortic valve mean gradient measures 9.0 mmHg. Aortic valve peak gradient measures 15.8 mmHg. Aortic valve area, by VTI measures 2.83 cm. Pulmonic Valve: The pulmonic valve was normal in structure. Pulmonic valve regurgitation is not visualized. Aorta: Aortic dilatation noted. There is moderate dilatation of the aortic root, measuring 44 mm. There is moderate dilatation of the ascending aorta, measuring 45 mm. Venous: The inferior vena cava is dilated in size with greater than 50% respiratory variability, suggesting right atrial pressure of 8 mmHg. IAS/Shunts: No atrial level shunt detected by color flow Doppler.  LEFT VENTRICLE PLAX 2D LVIDd:  5.10 cm      Diastology LVIDs:         4.10 cm      LV e' medial:    6.20 cm/s LV PW:          1.30 cm      LV E/e' medial:  18.6 LV IVS:        1.20 cm      LV e' lateral:   7.18 cm/s LVOT diam:     2.70 cm      LV E/e' lateral: 16.0 LV SV:         109 LV SV Index:   37 LVOT Area:     5.73 cm  LV Volumes (MOD) LV vol d, MOD A2C: 152.0 ml LV vol d, MOD A4C: 240.0 ml LV vol s, MOD A2C: 57.8 ml LV vol s, MOD A4C: 115.0 ml LV SV MOD A2C:     94.2 ml LV SV MOD A4C:     240.0 ml LV SV MOD BP:      115.4 ml RIGHT VENTRICLE            IVC RV S prime:     7.07 cm/s  IVC diam: 2.10 cm TAPSE (M-mode): 1.4 cm LEFT ATRIUM             Index        RIGHT ATRIUM           Index LA diam:        3.80 cm 1.28 cm/m   RA Area:     14.80 cm LA Vol (A2C):   35.6 ml 12.01 ml/m  RA Volume:   32.80 ml  11.06 ml/m LA Vol (A4C):   43.2 ml 14.57 ml/m LA Biplane Vol: 39.8 ml 13.42 ml/m  AORTIC VALVE AV Area (Vmax):    2.73 cm AV Area (Vmean):   2.77 cm AV Area (VTI):     2.83 cm AV Vmax:           199.00 cm/s AV Vmean:          135.000 cm/s AV VTI:            0.384 m AV Peak Grad:      15.8 mmHg AV Mean Grad:      9.0 mmHg LVOT Vmax:         94.80 cm/s LVOT Vmean:        65.300 cm/s LVOT VTI:          0.190 m LVOT/AV VTI ratio: 0.49  AORTA Ao Root diam: 4.40 cm Ao Asc diam:  4.40 cm MITRAL VALVE MV Area (PHT): 4.06 cm     SHUNTS MV Area VTI:   3.49 cm     Systemic VTI:  0.19 m MV Peak grad:  6.2 mmHg     Systemic Diam: 2.70 cm MV Mean grad:  4.0 mmHg MV Vmax:       1.25 m/s MV Vmean:      87.2 cm/s MV Decel Time: 187 msec MV E velocity: 115.13 cm/s MV A velocity: 107.77 cm/s MV E/A ratio:  1.07 Dalton McleanMD Electronically signed by Wilfred Lacy Signature Date/Time: 04/27/2023/4:10:47 PM    Final      Assessment and Plan:   Acute on chronic HFpEF Hypotension Patient has been admitted since 04/26/2023 with complaints of hypervolemia.  Last echocardiogram indicated LVEF of 55 to 60% with grade 2 diastolic dysfunction.  Nephrology has been following patient and has transitioned  from IV Lasix 80mg  BID, to torsemide 60  twice daily, to now torsemide 60mg  daily and has had good output.  Patient feels that he is swelling is actually gotten worse since admission.  On exam he still has 1+ nonpitting edema.  Physical exam is limited by excessive body habitus.  Patient also complaining of new onset of chest pain, generalized weakness, and worsening orthopnea.  In addition his renal function has progressively gotten worse throughout this admission and he has remained borderline hypotensive as well.  However, appears to be compensating right now and feels warm on exam. Consider right heart catheterization given worsening symptoms despite good output on diuretic regiment and borderline hypotension.  Patient may need additional vasopressor support.  Will discuss with MD. No indication for hemodialysis per nephrology Currently on torsemide 60 mg daily. Amlodipine has been stopped due to soft blood pressure and concomitant diuresis. May need to decrease carvedilol 12.5 mg twice daily. Did not receive this AM due to hypotension.  Continue Farxiga 10 mg daily. He has missing weights for the past two days, will recheck, but per the chart he has had an increase in 10 lbs since admission.  Chest pain CAD status post PCI to Lcx 2017 and CABG in 2021  Patient has had intermittent chest pain since heart catheterization in 2023 and has had progressive complaints that have worsened on this admission yesterday.  The chest pain is accompanied with occasional radiation to the neck, nausea, slight shortness of breath.  EKG shows normal sinus rhythm without any ischemic ST-T wave changes.  Troponins negative.  His last heart catheterization was in 2023 and noted the following: patent bypass grafts and results as follows: severe native CAD with 80% distal left main stenosis and widely patent LIMA grafts. Native circumflex was totally occluded with widely patent graft supplying the distal OM. Native dominant RCA has 80% focal proximal stenosis with  mild in-stent restenosis in the previously placed mid stent with 50% narrowing at the ostium of the PDA with widely patent SVG to PDA.  Given his significant CAD history he is on long-term DAPT. Continue aspirin 81 mg daily, Plavix 75 mg daily If patient needs right heart catheterization may want to also perform left heart catheterization given worsening anginal symptoms, though not presently a good candidate with worsening renal function.  Will discuss with MD. Currently on Ranexa 500 mg twice daily. Consider increasing dose. D dimer neg. Reordered telemetry. Somehow patient was taken off.   CKD stage IIIa Nephrology following.  Renal function has progressively declined since admission.  1.8-2.2 for now.  Initially presented with hyperkalemia which has now been corrected.  Hyperlipidemia Continue atorvastatin 80 mg daily. Will reorder lipid panel.  Bicuspid aortic valve with moderate calcification Murmur appreciated on exam.  No evidence of aortic stenosis at this time on most recent echocardiogram.  Continue to monitor  Aortic root dilatation (44 mm) and ascending aorta aneurysm (45 mm) Continue to monitor - not seen CT in 03/2022 Hold off CTA/MRA given renal function  Uncontrolled type 2 diabetes mellitus History of pulmonary embolism in 2014 COPD Bilateral BKA OSA Per primary team  Risk Assessment/Risk Scores:   New York Heart Association (NYHA) Functional Class NYHA Class III        For questions or updates, please contact [facility] Please consult [email or web address] for contact info under    Signed, Abagail Kitchens, PA-C  05/01/2023 11:54 AM

## 2023-05-01 NOTE — Progress Notes (Signed)
Nephrology Follow-Up Consult note   Assessment/Recommendations: Joseph [name] is a/an 52 y.o. male with a past medical history significant for CAD, PAD status post bilateral BKA, morbid obesity, DM2, PE, HFpEF COPD who presents with heart failure exacerbation found to have proteinuria   Proteinuria: UPC found to be 5.9.  Most likely diabetic kidney disease and UACR consistent with this as well.  Plan will be optimization of the medications and follow-up outpatient.  CKD 3b: Baseline creatinine seems to be around 1.5.  We have seen some rising creatinine with optimization of his volume status as well as institution of evidence-based medications.  This is not very surprising.  Low blood pressure is also likely contributing to some of the creatinine rise. -Continue with diuresis as below -Continue to monitor daily Cr, Dose meds for GFR -Monitor Daily I/Os, Daily weight  -Maintain MAP>65 for optimal renal perfusion.  -Avoid nephrotoxic medications including NSAIDs -Use synthetic opioids (Fentanyl/Dilaudid) if needed -Currently no indication for HD -I have arranged outpatient hospital follow-up with our office   CHF exacerbation: Volume status hard to determine given the patient's body habitus.  Responding well to diuretics here.  He has been responding well to torsemide and also started on SGLT2i.  I think he remains significantly volume overloaded.  Shortness of breath concerns as below.  Will continue torsemide 60 mg daily for now and continue to monitor closely.  Shortness of breath/chest pain: Worsened over the past 2 days.  This is despite significant diuresis.  He is on appropriate DVT prophylaxis but states he may have had a history of pulmonary embolism in the past.  Discussed with primary team.  Will get chest x-ray but may benefit from further evaluation for pulmonary embolism or other cardiac issue.  Hypotension: Could just be related to diuresis as well as and low pain taking a while  to wear off.  However, some concern for possible pulmonary embolism as above.  Further workup per primary team.  Consider midodrine.  If MAP decreases significantly consider pressors and ICU   Hypertension: Blood pressure lower today with diuresis.  We stopped amlodipine a couple days ago.  Hold parameters in for carvedilol.   Uncontrolled Diabetes Mellitus Type 2 with Hyperglycemia: Management per primary team   Recommendations conveyed to primary service.    Darnell Level [facility] 05/01/2023 9:27 AM  ___________________________________________________________  CC: Volume overload  Interval History/Subjective: Patient states he is not feeling super well today.  Having some chest pain as well as worsening shortness of breath.  Continued to diurese very well yesterday with 3.6 L made.  Creatinine slightly higher at 2.2.  Blood pressure fairly low today with systolics in the 90s.   Medications:  Current Facility-Administered Medications  Medication Dose Route Frequency Provider Last Rate Last Admin   0.9 %  sodium chloride infusion  250 mL Intravenous PRN Alfredo Martinez, MD       acetaminophen (TYLENOL) tablet 650 mg  650 mg Oral Q4H PRN Alfredo Martinez, MD   650 mg at 04/29/23 1735   albuterol (PROVENTIL) (2.5 MG/3ML) 0.083% nebulizer solution 2.5 mg  2.5 mg Nebulization Q6H PRN Rejeana Brock, MD       ALPRAZolam Prudy Feeler) tablet 1-2 mg  1-2 mg Oral QHS PRN Alfredo Martinez, MD   1 mg at 04/28/23 2308   aspirin EC tablet 81 mg  81 mg Oral q AM Alfredo Martinez, MD   81 mg at 05/01/23 0902   atorvastatin (LIPITOR) tablet 80  mg  80 mg Oral q1800 Alfredo Martinez, MD   80 mg at 04/30/23 1715   buPROPion (WELLBUTRIN XL) 24 hr tablet 150 mg  150 mg Oral Daily Alfredo Martinez, MD   150 mg at 05/01/23 0902   carvedilol (COREG) tablet 12.5 mg  12.5 mg Oral BID WC Darnell Level, MD   12.5 mg at 04/30/23 1715   clopidogrel (PLAVIX) tablet 75 mg  75 mg Oral Q breakfast  Alfredo Martinez, MD   75 mg at 05/01/23 0902   dapagliflozin propanediol (FARXIGA) tablet 10 mg  10 mg Oral Daily Darnell Level, MD   10 mg at 05/01/23 0902   enoxaparin (LOVENOX) injection 80 mg  80 mg Subcutaneous Q24H Alfredo Martinez, MD   80 mg at 04/30/23 1716   escitalopram (LEXAPRO) tablet 10 mg  10 mg Oral q AM Alfredo Martinez, MD   10 mg at 05/01/23 0902   icosapent Ethyl (VASCEPA) 1 g capsule 2 g  2 g Oral BID Alfredo Martinez, MD   2 g at 05/01/23 0902   insulin aspart (novoLOG) injection 0-15 Units  0-15 Units Subcutaneous TID WC Alfredo Martinez, MD   5 Units at 05/01/23 0859   insulin aspart (novoLOG) injection 0-5 Units  0-5 Units Subcutaneous QHS Lincoln Brigham, MD   3 Units at 04/30/23 2141   insulin glargine-yfgn (SEMGLEE) injection 35 Units  35 Units Subcutaneous BID [name], MD   35 Units at 05/01/23 0859   loratadine (CLARITIN) tablet 10 mg  10 mg Oral Daily Lincoln Brigham, MD   10 mg at 05/01/23 0902   nitroGLYCERIN (NITROSTAT) SL tablet 0.4 mg  0.4 mg Sublingual Q5 min PRN Fayette Pho, MD       oxyCODONE (Oxy IR/ROXICODONE) immediate release tablet 15 mg  15 mg Oral Q4H PRN Alfredo Martinez, MD   15 mg at 05/01/23 0904   pantoprazole (PROTONIX) EC tablet 40 mg  40 mg Oral Daily Alfredo Martinez, MD   40 mg at 05/01/23 0902   polyethylene glycol (MIRALAX / GLYCOLAX) packet 17 g  17 g Oral Daily [name], MD       prednisoLONE acetate (PRED FORTE) 1 % ophthalmic suspension 1 drop  1 drop Left Eye QID Alfredo Martinez, MD   1 drop at 04/30/23 1225   pregabalin (LYRICA) capsule 150 mg  150 mg Oral QHS Alfredo Martinez, MD   150 mg at 04/30/23 2124   pregabalin (LYRICA) capsule 300 mg  300 mg Oral Daily Alfredo Martinez, MD   300 mg at 05/01/23 0902   ranolazine (RANEXA) 12 hr tablet 500 mg  500 mg Oral BID Alfredo Martinez, MD   500 mg at 05/01/23 0902   sodium chloride flush (NS) 0.9 % injection 3 mL  3 mL Intravenous Q12H Maxwell, Allee, MD   3 mL at 05/01/23 0903   sodium  chloride flush (NS) 0.9 % injection 3 mL  3 mL Intravenous PRN Alfredo Martinez, MD       torsemide (DEMADEX) tablet 60 mg  60 mg Oral Daily Darnell Level, MD   60 mg at 05/01/23 0902      Review of Systems: 10 systems reviewed and negative except per interval history/subjective  Physical Exam: Vitals:   04/30/23 2128 05/01/23 0418  BP: (!) 91/51 93/74  Pulse: 78 82  Resp: 18 18  Temp: 98 F (36.7 C) 97.9 F (36.6 C)  SpO2: 94% 93%   No intake/output data recorded.  Intake/Output Summary (  Last 24 hours) at 05/01/2023 4098 Last data filed at 05/01/2023 0400 Gross per 24 hour  Intake 1074 ml  Output 2650 ml  Net -1576 ml   Constitutional: Obese, no obvious distress ENMT: ears and nose without scars or lesions, MMM CV: normal rate, 2+ edema in the bilateral thighs Respiratory: Bilateral chest rise, normal work of breathing Gastrointestinal: soft, non-tender, no palpable masses or hernias Skin: no visible lesions or rashes Psych: alert, judgement/insight appropriate, appropriate mood and affect   Test Results I personally reviewed new and old clinical labs and radiology tests Lab Results  Component Value Date   NA 136 05/01/2023   K 4.4 05/01/2023   CL 100 05/01/2023   CO2 28 05/01/2023   BUN 54 (H) 05/01/2023   CREATININE 2.24 (H) 05/01/2023   GFR 78.79 05/28/2015   CALCIUM 8.3 (L) 05/01/2023   ALBUMIN 2.2 (L) 04/27/2023   PHOS 2.4 (L) 07/04/2022    CBC Recent Labs  Lab 04/26/23 1212  WBC 6.7  HGB 12.4*  HCT 40.9  MCV 79.9*  PLT 261

## 2023-05-02 ENCOUNTER — Ambulatory Visit: Payer: Medicare HMO

## 2023-05-02 DIAGNOSIS — N179 Acute kidney failure, unspecified: Secondary | ICD-10-CM | POA: Diagnosis not present

## 2023-05-02 DIAGNOSIS — R609 Edema, unspecified: Secondary | ICD-10-CM

## 2023-05-02 DIAGNOSIS — N049 Nephrotic syndrome with unspecified morphologic changes: Secondary | ICD-10-CM | POA: Diagnosis not present

## 2023-05-02 DIAGNOSIS — N1831 Chronic kidney disease, stage 3a: Secondary | ICD-10-CM

## 2023-05-02 DIAGNOSIS — R079 Chest pain, unspecified: Secondary | ICD-10-CM | POA: Diagnosis not present

## 2023-05-02 LAB — PROTEIN ELECTROPHORESIS, SERUM
A/G Ratio: 0.9 (ref 0.7–1.7)
Albumin ELP: 2.4 g/dL — ABNORMAL LOW (ref 2.9–4.4)
Alpha-1-Globulin: 0.2 g/dL (ref 0.0–0.4)
Alpha-2-Globulin: 1 g/dL (ref 0.4–1.0)
Beta Globulin: 1 g/dL (ref 0.7–1.3)
Gamma Globulin: 0.5 g/dL (ref 0.4–1.8)
Globulin, Total: 2.7 g/dL (ref 2.2–3.9)
Total Protein ELP: 5.1 g/dL — ABNORMAL LOW (ref 6.0–8.5)

## 2023-05-02 LAB — GLUCOSE, CAPILLARY
Glucose-Capillary: 131 mg/dL — ABNORMAL HIGH (ref 70–99)
Glucose-Capillary: 160 mg/dL — ABNORMAL HIGH (ref 70–99)
Glucose-Capillary: 158 mg/dL — ABNORMAL HIGH (ref 70–99)
Glucose-Capillary: 168 mg/dL — ABNORMAL HIGH (ref 70–99)

## 2023-05-02 LAB — BASIC METABOLIC PANEL
Anion gap: 10 (ref 5–15)
BUN: 53 mg/dL — ABNORMAL HIGH (ref 6–20)
CO2: 27 mmol/L (ref 22–32)
Calcium: 8.6 mg/dL — ABNORMAL LOW (ref 8.9–10.3)
Chloride: 98 mmol/L (ref 98–111)
Creatinine, Ser: 2.25 mg/dL — ABNORMAL HIGH (ref 0.61–1.24)
GFR, Estimated: 35 mL/min — ABNORMAL LOW (ref >60)
Glucose, Bld: 177 mg/dL — ABNORMAL HIGH (ref 70–99)
Potassium: 4.4 mmol/L (ref 3.5–5.1)
Sodium: 135 mmol/L (ref 135–145)

## 2023-05-02 LAB — LIPID PANEL
Cholesterol: 136 mg/dL (ref 0–200)
HDL: 40 mg/dL — ABNORMAL LOW (ref >40)
LDL Cholesterol: 78 mg/dL (ref 0–99)
Total CHOL/HDL Ratio: 3.4 RATIO
Triglycerides: 89 mg/dL (ref <150)
VLDL: 18 mg/dL (ref 0–40)

## 2023-05-02 LAB — CBC
HCT: 36.1 % — ABNORMAL LOW (ref 39.0–52.0)
Hemoglobin: 11.2 g/dL — ABNORMAL LOW (ref 13.0–17.0)
MCH: 24.8 pg — ABNORMAL LOW (ref 26.0–34.0)
MCHC: 31 g/dL (ref 30.0–36.0)
MCV: 79.9 fL — ABNORMAL LOW (ref 80.0–100.0)
Platelets: 222 10*3/uL (ref 150–400)
RBC: 4.52 MIL/uL (ref 4.22–5.81)
RDW: 18.7 % — ABNORMAL HIGH (ref 11.5–15.5)
WBC: 9.3 10*3/uL (ref 4.0–10.5)
nRBC: 0 % (ref 0.0–0.2)

## 2023-05-02 MED ORDER — INSULIN ASPART 100 UNIT/ML IJ SOLN
3.0000 [IU] | Freq: Three times a day (TID) | INTRAMUSCULAR | Status: DC
  Administered 2023-05-02 – 2023-05-03 (×5): 3 [IU] via SUBCUTANEOUS

## 2023-05-02 NOTE — Assessment & Plan Note (Addendum)
No further chest pain reported. Will plan for outpatient Cardiology follow up. -Cardiology following, appreciate recs -Continue home Ranolazine 150mg  BID

## 2023-05-02 NOTE — TOC Progression Note (Signed)
Transition of Care [facility]) - Progression Note    Patient Details  Name: Joseph [name] MRN: [id number] Date of Birth: 1971/07/22  Transition of Care [facility] CM/SW Contact  Tom-Johnson, Hershal Coria, RN Phone Number: 05/02/2023, 1:15 PM  Clinical Narrative:     Patient continues to be diuresed, Nephrology and Cardiology following for Nephrosis/Hypovolemia and Heart Failure. Recommends to continue long-term DAPT. CM will continue to follow as patient progresses with care towards discharge.         Expected Discharge Plan: OP Rehab Barriers to Discharge: Continued Medical Work up  Expected Discharge Plan and Services   Discharge Planning Services: CM Consult Post Acute Care Choice: NA (Outpatient) Living arrangements for the past 2 months: Single Family Home                 DME Arranged: [facility] (Bariatric) DME Agency: NA       HH Arranged: NA HH Agency: NA         Social Determinants of Health (SDOH) Interventions SDOH Screenings   Food Insecurity: No Food Insecurity (09/09/2022)  Housing: Medium Risk (09/09/2022)  Transportation Needs: Unmet Transportation Needs (09/09/2022)  Utilities: Not At Risk (09/09/2022)  Depression (PHQ2-9): Low Risk  (05/06/2021)  Financial Resource Strain: Medium Risk (09/09/2022)  Tobacco Use: Medium Risk (04/26/2023)    Readmission Risk Interventions    11/24/2021    4:05 PM 08/20/2021   12:26 PM 07/12/2021   12:29 PM  Readmission Risk Prevention Plan  Transportation Screening Complete Complete Complete  Medication Review Oceanographer) Complete Complete Complete  PCP or Specialist appointment within 3-5 days of discharge Complete Complete Complete  HRI or Home Care Consult Complete Complete Complete  SW Recovery Care/Counseling Consult Complete Complete Complete  Palliative Care Screening Not Applicable Not Applicable Not Applicable  Skilled Nursing Facility Complete Not Applicable Not Applicable

## 2023-05-02 NOTE — Assessment & Plan Note (Signed)
Stable without Amlodipine.

## 2023-05-02 NOTE — Progress Notes (Signed)
[facility] Progress Note   Assessment/ Plan:   Joseph [name] is a/an 52 y.o. male with a past medical history significant for CAD, PAD status post bilateral BKA, morbid obesity, DM2, PE, HFpEF COPD who presents with heart failure exacerbation found to have proteinuria   Proteinuria: UPC found to be 5.9.  Most likely diabetic kidney disease and UACR consistent with this as well.  Plan will be optimization of the medications and follow-up outpatient.   CKD 3b: Baseline creatinine seems to be around 1.5.  We have seen some rising creatinine with optimization of his volume status as well as institution of evidence-based medications.  This is not very surprising.  Low blood pressure is also likely contributing to some of the creatinine rise. -Continue to monitor daily Cr, Dose meds for GFR -Monitor Daily I/Os, Daily weight  -Maintain MAP>65 for optimal renal perfusion.  -Avoid nephrotoxic medications including NSAIDs -Use synthetic opioids (Fentanyl/Dilaudid) if needed -Currently no indication for HD - has OP followup with DR Joseph [name] in office His Cr has plateaued with SGLT2i and torsemide.  He is diuresing well.  No further suggestions.  Will sign off.     CHF exacerbation: - on PO antibiotics   Shortness of breath/chest pain: Worsened over the past 2 days.  This is despite significant diuresis.  He is on appropriate DVT prophylaxis but states he may have had a history of pulmonary embolism in the past.  Discussed with primary team.  Will get chest x-ray but may benefit from further evaluation for pulmonary embolism or other cardiac issue.   Hypertension: Blood pressure lower today with diuresis.  We stopped amlodipine a couple days ago.  Hold parameters in for carvedilol.   Uncontrolled Diabetes Mellitus Type 2 with Hyperglycemia: Management per primary team  Subjective:    Seen in room.  Still has some edema.  On torsemide and Farxiga   Objective:   BP 116/69   Pulse  72   Temp 97.9 F (36.6 C) (Oral)   Resp 18   Wt (!) 179.3 kg   SpO2 94%   BMI 46.87 kg/m   Physical Exam: Gen:NAD, sitting in bed CVS: RRR Resp: clear Abd: soft Ext: 2+ tight pitting edema  Labs: BMET Recent Labs  Lab 04/27/23 1328 04/28/23 0035 04/28/23 1540 04/29/23 0047 04/30/23 0134 05/01/23 0242 05/02/23 1041  NA 138 136 135 136 136 136 135  K 5.3* 5.0 4.7 4.8 5.1 4.4 4.4  CL 104 101 102 101 101 100 98  CO2 26 26 26 26 28 28 27   GLUCOSE 201* 210* 246* 279* 257* 207* 177*  BUN 39* 41* 45* 44* 50* 54* 53*  CREATININE 1.69* 1.75* 1.76* 1.90* 2.09* 2.24* 2.25*  CALCIUM 8.6* 8.7* 8.3* 8.4* 8.5* 8.3* 8.6*   CBC Recent Labs  Lab 04/26/23 1212 05/02/23 1041  WBC 6.7 9.3  HGB 12.4* 11.2*  HCT 40.9 36.1*  MCV 79.9* 79.9*  PLT 261 222      Medications:     aspirin EC  81 mg Oral q AM   atorvastatin  80 mg Oral q1800   buPROPion  150 mg Oral Daily   carvedilol  3.125 mg Oral BID WC   clopidogrel  75 mg Oral Q breakfast   dapagliflozin propanediol  10 mg Oral Daily   diltiazem  30 mg Oral Q6H   enoxaparin (LOVENOX) injection  80 mg Subcutaneous Q24H   escitalopram  10 mg Oral q AM   icosapent Ethyl  2 g Oral BID   insulin aspart  0-15 Units Subcutaneous TID WC   insulin aspart  0-5 Units Subcutaneous QHS   insulin aspart  3 Units Subcutaneous TID WC   insulin glargine-yfgn  35 Units Subcutaneous BID   loratadine  10 mg Oral Daily   pantoprazole  40 mg Oral BID   polyethylene glycol  17 g Oral Daily   pregabalin  150 mg Oral QHS   pregabalin  300 mg Oral Daily   ranolazine  500 mg Oral BID   sodium chloride flush  3 mL Intravenous Q12H   torsemide  60 mg Oral Daily     Bufford Buttner MD 05/02/2023, 1:33 PM

## 2023-05-02 NOTE — Progress Notes (Signed)
Rounding Note    Patient Name: Joseph [name] Date of Encounter: 05/02/2023  [facility] Cardiologist: Chilton Si, MD   Subjective   No acute events overnight. Generally feels poorly.   Inpatient Medications    Scheduled Meds:  aspirin EC  81 mg Oral q AM   atorvastatin  80 mg Oral q1800   buPROPion  150 mg Oral Daily   carvedilol  3.125 mg Oral BID WC   clopidogrel  75 mg Oral Q breakfast   dapagliflozin propanediol  10 mg Oral Daily   diltiazem  30 mg Oral Q6H   enoxaparin (LOVENOX) injection  80 mg Subcutaneous Q24H   escitalopram  10 mg Oral q AM   icosapent Ethyl  2 g Oral BID   insulin aspart  0-15 Units Subcutaneous TID WC   insulin aspart  0-5 Units Subcutaneous QHS   insulin aspart  3 Units Subcutaneous TID WC   insulin glargine-yfgn  35 Units Subcutaneous BID   loratadine  10 mg Oral Daily   pantoprazole  40 mg Oral BID   polyethylene glycol  17 g Oral Daily   pregabalin  150 mg Oral QHS   pregabalin  300 mg Oral Daily   ranolazine  500 mg Oral BID   sodium chloride flush  3 mL Intravenous Q12H   torsemide  60 mg Oral Daily   Continuous Infusions:  sodium chloride     PRN Meds: sodium chloride, acetaminophen, albuterol, ALPRAZolam, nitroGLYCERIN, oxyCODONE, sodium chloride flush   Vital Signs    Vitals:   05/01/23 2033 05/02/23 0006 05/02/23 0540 05/02/23 0916  BP: 117/71 (!) 97/55 102/61 126/84  Pulse: 80 73 76 75  Resp: 13 18 16 18   Temp: 98.9 F (37.2 C) 98.9 F (37.2 C) 97.7 F (36.5 C) 98.3 F (36.8 C)  TempSrc: Oral Oral Oral   SpO2: 92%  92% 94%  Weight:   (!) 179.3 kg     Intake/Output Summary (Last 24 hours) at 05/02/2023 1058 Last data filed at 05/02/2023 1024 Gross per 24 hour  Intake 480 ml  Output 2100 ml  Net -1620 ml      05/02/2023    5:40 AM 05/01/2023    3:31 PM 04/29/2023    4:51 AM  Last 3 Weights  Weight (lbs) 395 lb 4.6 oz 363 lb 8.6 oz 398 lb 9.5 oz  Weight (kg) 179.3 kg 164.9 kg 180.8 kg       Telemetry    SR - Personally Reviewed  ECG    No new - Personally Reviewed  Physical Exam   GEN: No acute distress.   Neck: No JVD appreciated sitting upright Cardiac: RRR, no rubs, or gallops. 2/6 harsh SEM RUSB Respiratory: Clear to auscultation bilaterally. GI: Soft, nontender, non-distended  MS: bilateral BKA. Dependent firm edema bilaterally, nonpitting anteriorly and slightly pitting posteriorly Neuro:  Nonfocal  Psych: Normal affect   Labs    High Sensitivity Troponin:   Recent Labs  Lab 04/26/23 1212 04/26/23 1429 04/30/23 1010  TROPONINIHS 10 9 8      Chemistry Recent Labs  Lab 04/27/23 1328 04/28/23 0035 04/29/23 0047 04/30/23 0134 05/01/23 0242  NA 138   < > 136 136 136  K 5.3*   < > 4.8 5.1 4.4  CL 104   < > 101 101 100  CO2 26   < > 26 28 28   GLUCOSE 201*   < > 279* 257* 207*  BUN 39*   < >  44* 50* 54*  CREATININE 1.69*   < > 1.90* 2.09* 2.24*  CALCIUM 8.6*   < > 8.4* 8.5* 8.3*  ALBUMIN 2.2*  --   --   --   --   GFRNONAA 49*   < > 42* 38* 35*  ANIONGAP 8   < > 9 7 8    < > = values in this interval not displayed.    Lipids  Recent Labs  Lab 05/02/23 0128  CHOL 136  TRIG 89  HDL 40*  LDLCALC 78  CHOLHDL 3.4    Hematology Recent Labs  Lab 04/26/23 1212  WBC 6.7  RBC 5.12  HGB 12.4*  HCT 40.9  MCV 79.9*  MCH 24.2*  MCHC 30.3  RDW 18.6*  PLT 261   Thyroid No results for input(s): "TSH", "FREET4" in the last 168 hours.  BNP Recent Labs  Lab 04/25/23 1520 04/26/23 1214  BNP  --  137.3*  PROBNP 375*  --     DDimer  Recent Labs  Lab 05/01/23 0925  DDIMER 0.50     Radiology    DG CHEST PORT 1 VIEW  Result Date: 05/01/2023 CLINICAL DATA:  Shortness of breath EXAM: PORTABLE CHEST 1 VIEW COMPARISON:  Chest x-ray Apr 26, 2023 FINDINGS: The heart size and mediastinal contours are within normal limits. Both lungs are clear. The visualized skeletal structures are unremarkable. IMPRESSION: No active disease. Electronically  Signed   By: Gerome Sam III M.D.   On: 05/01/2023 09:44    Cardiac Studies   Echocardiogram 04/27/2023 1. Left ventricular ejection fraction, by estimation, is 55 to 60%. The  left ventricle has normal function. The left ventricle has no regional  wall motion abnormalities. There is mild concentric left ventricular  hypertrophy. Left ventricular diastolic  parameters are consistent with Grade II diastolic dysfunction  (pseudonormalization).   2. Right ventricular systolic function is normal. The right ventricular  size is normal. Tricuspid regurgitation signal is inadequate for assessing  PA pressure.   3. The mitral valve is normal in structure. No evidence of mitral valve  regurgitation. No evidence of mitral stenosis.   4. The aortic valve appears functionally bicuspid with fused left and  right coronary cusps. There is moderate calcification of the aortic valve.  Aortic valve regurgitation is not visualized. Aortic valve  sclerosis/calcification is present, without any  evidence of aortic stenosis. Aortic valve mean gradient measures 9.0 mmHg.   5. Aortic dilatation noted. There is moderate dilatation of the aortic  root, measuring 44 mm. There is moderate dilatation of the ascending  aorta, measuring 45 mm.   6. The inferior vena cava is dilated in size with >50% respiratory  variability, suggesting right atrial pressure of 8 mmHg.   Right and left heart catheterization 06/17/2022 Mid LM to Ost LAD lesion is 80% stenosed with 40% stenosed side branch in Ost Cx to Prox Cx.   Mid Cx-2 lesion is 5% stenosed.   Dist Cx lesion is 70% stenosed.   Mid Cx-1 lesion is 100% stenosed.   Prox RCA to Mid RCA lesion is 30% stenosed.   Dist RCA lesion is 50% stenosed with 50% stenosed side branch in RPDA.   Prox RCA lesion is 80% stenosed.   Prox Graft to Mid Graft lesion is 30% stenosed.   1st Diag lesion is 40% stenosed.   3rd Diag lesion is 60% stenosed.   Mid LAD lesion is 50%  stenosed.   SVG and is large.  SVG and is normal in caliber.   SVG and is large.   The graft exhibits no disease.   The graft exhibits no disease.   Severe native CAD with 80% distal left main stenosis with widely patent LIMA graft supplying the LAD and SVG supplying the diagonal vessel.   The native circumflex is totally occluded with a widely patent vein graft supplying the distal OM vessel.   The native dominant RCA has 80% focal proximal stenosis with mild in-stent restenosis in the previously placed mid stent with 50% narrowing at the ostium of the PDA with widely patent SVG to PDA.   Normal right heart pressures with mean PA pressure at 21 mmHg/   RECOMMENDATION: Medical therapy.  Continue long-term DAPT.  Optimal blood pressure control with target blood pressure less than 130/80.  Aggressive lipid-lowering therapy with target LDL less than 55.  Patient Profile     52 y.o. male with PMH chronic diastolic heart failure, CAD with PCI 2017 and CABG 2021, bicuspid aortic valve with TAA, PAD and type II diabetes s/p bilateral BKA, chronic kidney disease stage 3a. He presented with hypervolemia and was admitted for diuresis.  Assessment & Plan    Possible acute on chronic diastolic heart failure vs other edema Acute kidney injury on chronic kidney disease stage 3a -appreciate nephrology assistance -BNP 137 on admission -his lungs are clear and he does not have visible JVD (though difficult body habitus). Does not appear consistent with typical heart failure exacerbation. Has firm dependent edema.  -weights are not consistent. Charted net negative 11L -if his volume status remains uncertain, would consider right heart cath for further evaluation  Chest pain CAD with PCI 2017 and CABG 2021 Hyperlipidemia PAD -notes chronic and intermittent since last cath in 2023 -troponins negative -continue aspirin and clopidogrel -continue ranolazine -on beta blocker -continue atorvastatin,  LDL 78. Consider PCSK9i as outpatient  Bicuspid aortic valve TAA -monitor as an outpatient  Type II diabetes Bilateral BKA -A1c 7.4  For questions or updates, please contact [facility] Please consult [email or web address] for contact info under        Signed, Jodelle Red, MD  05/02/2023, 10:58 AM

## 2023-05-02 NOTE — Progress Notes (Signed)
PT Cancellation Note  Patient Details Name: QUIDO BRUINGTON MRN: [id number] DOB: 10-May-1971   Cancelled Treatment:    Reason Eval/Treat Not Completed: Patient at procedure or test/unavailable. Pt currently in with OT. Will check back as schedule allows to continue with PT POC.    Marylynn Pearson 05/02/2023, 2:43 PM  Conni Slipper, PT, DPT Acute Rehabilitation Services Secure Chat Preferred Office: [phone]

## 2023-05-02 NOTE — Progress Notes (Signed)
Patient refused dressing change to LLE wound. Patient stated she wanted to sleep and will have it done later when he woke up.

## 2023-05-02 NOTE — Progress Notes (Signed)
Occupational Therapy Treatment and Discharge Patient Details Name: Joseph [name] MRN: [id number] DOB: [birth year] Today's Date: 05/02/2023   History of present illness Pt is a 52 y/o male who presents 04/26/23 per his cardiologist's request 2 abnormal labs and volume overload. PMH signficant for AAA aneurysm, anxiety, asthma, CAD, COPD, DM, DVT, HNT, migraine, morbid obesity, MI, PE, peripheral nerve disease, B BKA with prosthetics, CABG, bilateral shoulder open rotator cuff repair.   OT comments  Pt typically does an A-P transfer to and from [facility] at home, only laterally scoots with transfer board to car. He is modified independent with transfers. Pt is able to get down in tub to bathe and transfer back to toilet, although he does have tub equipment. Pt not interested in UB strengthening program while admitted citing longstanding R shoulder dysfunction. He has been educated in stress management in previous OT session. Pt and family (daughter and grandson in room) with no further concerns. OT is signing off.    Recommendations for follow up therapy are one component of a multi-disciplinary discharge planning process, led by the attending physician.  Recommendations may be updated based on patient status, additional functional criteria and insurance authorization.    Assistance Recommended at Discharge PRN  Patient can return home with the following  Assistance with cooking/housework;Assist for transportation;Help with stairs or ramp for entrance   Equipment Recommendations  None recommended by OT    Recommendations for Other Services      Precautions / Restrictions Precautions Precautions: Fall Restrictions Weight Bearing Restrictions: Yes LLE Weight Bearing: Non weight bearing Other Position/Activity Restrictions: due to new wound       Mobility Bed Mobility                    Transfers                         Balance     Sitting balance-Leahy Scale: Good                                      ADL either performed or assessed with clinical judgement   ADL                       Lower Body Dressing: Modified independent;Sitting/lateral leans       Toileting- Clothing Manipulation and Hygiene: Modified independent;Sitting/lateral lean              Extremity/Trunk Assessment Upper Extremity Assessment RUE Deficits / Details: Pt reports baseline pain and weakness in the R shoulder due to prior injury and repair.            Vision       Perception     Praxis      Cognition Arousal/Alertness: Awake/alert Behavior During Therapy: WFL for tasks assessed/performed Overall Cognitive Status: Within Functional Limits for tasks assessed                                          Exercises      Shoulder Instructions       General Comments      Pertinent Vitals/ Pain       Pain Assessment Pain Assessment: Faces Faces Pain Scale: Hurts little more Pain Location: R  shoulder--chronic Pain Descriptors / Indicators: Discomfort Pain Intervention(s): Repositioned, Monitored during session  Home Living                                          Prior Functioning/Environment              Frequency           Progress Toward Goals  OT Goals(current goals can now be found in the care plan section)  Progress towards OT goals: Goals met/education completed, patient discharged from OT     Plan All goals met and education completed, patient discharged from OT services    Co-evaluation                 AM-PAC OT "6 Clicks" Daily Activity     Outcome Measure   Help from another person eating meals?: None Help from another person taking care of personal grooming?: None Help from another person toileting, which includes using toliet, bedpan, or urinal?: A Little Help from another person bathing (including washing, rinsing, drying)?: None Help from another  person to put on and taking off regular upper body clothing?: None Help from another person to put on and taking off regular lower body clothing?: None 6 Click Score: 23    End of Session    OT Visit Diagnosis: Muscle weakness (generalized) (M62.81)   Activity Tolerance Patient tolerated treatment well   Patient Left in bed;with call bell/phone within reach;with family/visitor present   Nurse Communication          Time: 1610-9604 OT Time Calculation (min): 26 min  Charges: OT General Charges $OT Visit: 1 Visit OT Treatments $Self Care/Home Management : 8-22 mins  Joseph [name], OTR/L Acute Rehabilitation Services Office: [phone]   Evern Bio 05/02/2023, 3:58 PM

## 2023-05-02 NOTE — Care Management Important Message (Signed)
Important Message  Patient Details  Name: Joseph [name] MRN: [id number] Date of Birth: Sep 03, 1971   Medicare Important Message Given:  Yes     Brynlyn Dade Stefan Church 05/02/2023, 2:49 PM

## 2023-05-02 NOTE — Progress Notes (Signed)
Daily Progress Note Intern Pager: [phone]  Patient name: Joseph [name] Medical record number: [id number] Date of birth: 08/23/1971 Age: 52 y.o. Gender: male  Primary Care Provider: Marva Panda, NP Consultants: Cardiology, Nephrology Code Status: FULL   Pt Overview and Major Events to Date:  5/7: Admitted 5/8: Nephrology consulted 5/12: Cardiology consulted   Assessment and Plan: Joseph [name] is a 52 y.o. male presenting with hypervolemia in the setting of HF exacerbation with concurrent AKI on CKD.  PMHx includes T2DM, bilateral BKA d/t diabetic foot, HLD, CAD s/p CABG, PE, CHF, PAD, aortic aneurysm, COPD, anxiety, GERD, HTN, and OSA.  * Nephrosis Most likely cause of hypervolemia. Difficult to assess volume status given body habitus, continues to diuresis well and will likely need further diuresis outpatient. -Nephrology following, appreciate care -Farxiga 10mg  daily -Pending SPEP  Acute renal failure superimposed on stage 3a chronic kidney disease (HCC) Cr stabilized from prior day. Continues to be elevated from baseline. -Nephrology following, appreciate recs -Continue Torsemide 60mg  daily -Avoid nephrotoxic agents  Heart failure with preserved ejection fraction (HCC) ~1.7L UOP, none reported overnight but atleast 1L at bedside. Weights appear inaccurate. Cardiology consulted and agree volume most likely not secondary to HF. Will consider RHC to assess volume status but continues to diuresis well. -Continue home Coreg 12.5mg  BID  Below-knee amputation of left lower extremity (HCC) Stable. Wound care per nursing.  Hypotension Softer BPs, Amlodipine held. Well perfused. -Monitor BP closely  Recurrent chest pain Intermittent chest pain reported but none overnight. D-dimer negative. CXR normal. EKG and troponins normal. Most likely microvessel disease per Cards. -Cardiology following, appreciate recs -Continue home Ranolazine 150mg  BID  Insulin  dependent type 2 diabetes mellitus (HCC) BGL 100-200s. -Continue Semglee 35u BID -Add Novolog 3U TID (home dosing) -mSSI with CBG checks   Chronic stable conditions: Anxiety: continue Xanax 1-2mg  at night PRN, Bupriprion 150mg  daily, Escitalopram 10mg  daily HLD: continue Atorvastatin 80mg  daily PAD: continue ASA and Plavix GERD: continue pantoprazole 40mg   Neuropathy: continue Pregabalin 150mg  TID OSA: CPAP nightly   FEN/GI: Heart healthy PPx: Lovenox Dispo:Home pending clinical improvement .   Subjective:  Patient assessed at bedside, sitting at edge of the bed. States he has a headache that has been persistent overnight. No focal weakness or vision changes. States he feels about the same as prior day. Denies chest pain and SOB. Recent BM yesterday.  Objective: Temp:  [97.7 F (36.5 C)-99 F (37.2 C)] 97.9 F (36.6 C) (05/13 1203) Pulse Rate:  [72-80] 72 (05/13 1203) Resp:  [13-18] 18 (05/13 0916) BP: (97-126)/(55-84) 116/69 (05/13 1159) SpO2:  [90 %-94 %] 94 % (05/13 1203) Weight:  [164.9 kg-179.3 kg] 179.3 kg (05/13 0540) Physical Exam: General: Sitting up at edge of bed, mildly disheveled Cardiovascular: RRR without murmur Respiratory: CTAB. Normal WOB on RA Abdomen: Large pannus, soft, mildly TTP over RLQ Extremities: Unchanged non-pitting edema of upper and lower extremities  Laboratory: Most recent CBC Lab Results  Component Value Date   WBC 9.3 05/02/2023   HGB 11.2 (L) 05/02/2023   HCT 36.1 (L) 05/02/2023   MCV 79.9 (L) 05/02/2023   PLT 222 05/02/2023   Most recent BMP    Latest Ref Rng & Units 05/02/2023   10:41 AM  BMP  Glucose 70 - 99 mg/dL 696   BUN 6 - 20 mg/dL 53   Creatinine 2.95 - 1.24 mg/dL 2.84   Sodium 132 - 440 mmol/L 135   Potassium 3.5 - 5.1 mmol/L  4.4   Chloride 98 - 111 mmol/L 98   CO2 22 - 32 mmol/L 27   Calcium 8.9 - 10.3 mg/dL 8.6     Other pertinent labs: Glu: 131 Lipid: HDL 40 D-dimer: 0.5  Imaging/Diagnostic  Tests: DG CHEST PORT 1 VIEW Result Date: 05/01/2023 IMPRESSION: No active disease.  Joseph Fortis, MD 05/02/2023, 12:57 PM  PGY-1, [facility] FPTS Intern pager: [phone], text pages welcome Secure chat group [facility] Teaching Service

## 2023-05-03 ENCOUNTER — Other Ambulatory Visit: Payer: Medicare HMO

## 2023-05-03 ENCOUNTER — Other Ambulatory Visit (HOSPITAL_COMMUNITY): Payer: Self-pay

## 2023-05-03 DIAGNOSIS — R609 Edema, unspecified: Secondary | ICD-10-CM | POA: Diagnosis not present

## 2023-05-03 DIAGNOSIS — R079 Chest pain, unspecified: Secondary | ICD-10-CM | POA: Diagnosis not present

## 2023-05-03 DIAGNOSIS — N179 Acute kidney failure, unspecified: Secondary | ICD-10-CM | POA: Diagnosis not present

## 2023-05-03 DIAGNOSIS — N049 Nephrotic syndrome with unspecified morphologic changes: Secondary | ICD-10-CM | POA: Diagnosis not present

## 2023-05-03 LAB — CBC
HCT: 31.7 % — ABNORMAL LOW (ref 39.0–52.0)
Hemoglobin: 9.9 g/dL — ABNORMAL LOW (ref 13.0–17.0)
MCH: 24.6 pg — ABNORMAL LOW (ref 26.0–34.0)
MCHC: 31.2 g/dL (ref 30.0–36.0)
MCV: 78.9 fL — ABNORMAL LOW (ref 80.0–100.0)
Platelets: 220 10*3/uL (ref 150–400)
RBC: 4.02 MIL/uL — ABNORMAL LOW (ref 4.22–5.81)
RDW: 18.6 % — ABNORMAL HIGH (ref 11.5–15.5)
WBC: 8.2 10*3/uL (ref 4.0–10.5)
nRBC: 0 % (ref 0.0–0.2)

## 2023-05-03 LAB — BASIC METABOLIC PANEL
Anion gap: 9 (ref 5–15)
BUN: 55 mg/dL — ABNORMAL HIGH (ref 6–20)
CO2: 29 mmol/L (ref 22–32)
Calcium: 8.2 mg/dL — ABNORMAL LOW (ref 8.9–10.3)
Chloride: 99 mmol/L (ref 98–111)
Creatinine, Ser: 2.23 mg/dL — ABNORMAL HIGH (ref 0.61–1.24)
GFR, Estimated: 35 mL/min — ABNORMAL LOW (ref >60)
Glucose, Bld: 200 mg/dL — ABNORMAL HIGH (ref 70–99)
Potassium: 4.1 mmol/L (ref 3.5–5.1)
Sodium: 137 mmol/L (ref 135–145)

## 2023-05-03 LAB — GLUCOSE, CAPILLARY
Glucose-Capillary: 177 mg/dL — ABNORMAL HIGH (ref 70–99)
Glucose-Capillary: 217 mg/dL — ABNORMAL HIGH (ref 70–99)

## 2023-05-03 MED ORDER — TORSEMIDE 20 MG PO TABS
60.0000 mg | ORAL_TABLET | Freq: Every day | ORAL | 0 refills | Status: DC
  Filled 2023-05-03: qty 90, 30d supply, fill #0

## 2023-05-03 MED ORDER — LANTUS SOLOSTAR 100 UNIT/ML ~~LOC~~ SOPN
35.0000 [IU] | PEN_INJECTOR | Freq: Two times a day (BID) | SUBCUTANEOUS | 0 refills | Status: DC
  Filled 2023-05-03: qty 15, 21d supply, fill #0

## 2023-05-03 MED ORDER — DAPAGLIFLOZIN PROPANEDIOL 10 MG PO TABS
10.0000 mg | ORAL_TABLET | Freq: Every day | ORAL | 0 refills | Status: DC
  Filled 2023-05-03: qty 30, 30d supply, fill #0

## 2023-05-03 MED ORDER — FLUCONAZOLE 100 MG PO TABS
100.0000 mg | ORAL_TABLET | Freq: Once | ORAL | Status: AC
  Administered 2023-05-03: 100 mg via ORAL
  Filled 2023-05-03: qty 1

## 2023-05-03 MED ORDER — INSULIN PEN NEEDLE 32G X 4 MM MISC
0 refills | Status: DC
  Filled 2023-05-03: qty 100, 30d supply, fill #0

## 2023-05-03 MED ORDER — CARVEDILOL 6.25 MG PO TABS
6.2500 mg | ORAL_TABLET | Freq: Two times a day (BID) | ORAL | 0 refills | Status: DC
  Filled 2023-05-03: qty 60, 30d supply, fill #0

## 2023-05-03 NOTE — Progress Notes (Signed)
Rounding Note    Patient Name: Joseph [name] Date of Encounter: 05/03/2023  [facility] Cardiologist: Chilton Si, MD   Subjective   Denies any SOB. Chronic chest discomfort.   Inpatient Medications    Scheduled Meds:  aspirin EC  81 mg Oral q AM   atorvastatin  80 mg Oral q1800   buPROPion  150 mg Oral Daily   carvedilol  3.125 mg Oral BID WC   clopidogrel  75 mg Oral Q breakfast   dapagliflozin propanediol  10 mg Oral Daily   diltiazem  30 mg Oral Q6H   enoxaparin (LOVENOX) injection  80 mg Subcutaneous Q24H   escitalopram  10 mg Oral q AM   icosapent Ethyl  2 g Oral BID   insulin aspart  0-15 Units Subcutaneous TID WC   insulin aspart  0-5 Units Subcutaneous QHS   insulin aspart  3 Units Subcutaneous TID WC   insulin glargine-yfgn  35 Units Subcutaneous BID   loratadine  10 mg Oral Daily   pantoprazole  40 mg Oral BID   polyethylene glycol  17 g Oral Daily   pregabalin  150 mg Oral QHS   pregabalin  300 mg Oral Daily   ranolazine  500 mg Oral BID   sodium chloride flush  3 mL Intravenous Q12H   torsemide  60 mg Oral Daily   Continuous Infusions:  sodium chloride     PRN Meds: sodium chloride, acetaminophen, albuterol, ALPRAZolam, nitroGLYCERIN, oxyCODONE, sodium chloride flush   Vital Signs    Vitals:   05/02/23 1743 05/02/23 2228 05/03/23 0247 05/03/23 0828  BP: 118/67 125/68 113/75 108/64  Pulse: 75 71 75 73  Resp: 17 18  17   Temp: 98.4 F (36.9 C) 98 F (36.7 C)  (!) 97.5 F (36.4 C)  TempSrc:  Oral  Oral  SpO2: 96% 94% 97% 90%  Weight:    (!) 177.6 kg    Intake/Output Summary (Last 24 hours) at 05/03/2023 0941 Last data filed at 05/03/2023 0800 Gross per 24 hour  Intake 540 ml  Output 4400 ml  Net -3860 ml      05/03/2023    8:28 AM 05/02/2023    5:40 AM 05/01/2023    3:31 PM  Last 3 Weights  Weight (lbs) 391 lb 8.6 oz 395 lb 4.6 oz 363 lb 8.6 oz  Weight (kg) 177.6 kg 179.3 kg 164.9 kg      Telemetry    NSR with HR  70s - Personally Reviewed  ECG    NSR without significant ST-T wave changes - Personally Reviewed  Physical Exam   GEN: No acute distress.   Neck: No JVD Cardiac: RRR, no murmurs, rubs, or gallops.  Respiratory: Clear to auscultation bilaterally. GI: Soft, nontender, non-distended  MS: No edema; bilateral BKA Neuro:  Nonfocal  Psych: Normal affect   Labs    High Sensitivity Troponin:   Recent Labs  Lab 04/26/23 1212 04/26/23 1429 04/30/23 1010  TROPONINIHS 10 9 8      Chemistry Recent Labs  Lab 04/27/23 1328 04/28/23 0035 05/01/23 0242 05/02/23 1041 05/03/23 0309  NA 138   < > 136 135 137  K 5.3*   < > 4.4 4.4 4.1  CL 104   < > 100 98 99  CO2 26   < > 28 27 29   GLUCOSE 201*   < > 207* 177* 200*  BUN 39*   < > 54* 53* 55*  CREATININE 1.69*   < >  2.24* 2.25* 2.23*  CALCIUM 8.6*   < > 8.3* 8.6* 8.2*  ALBUMIN 2.2*  --   --   --   --   GFRNONAA 49*   < > 35* 35* 35*  ANIONGAP 8   < > 8 10 9    < > = values in this interval not displayed.    Lipids  Recent Labs  Lab 05/02/23 0128  CHOL 136  TRIG 89  HDL 40*  LDLCALC 78  CHOLHDL 3.4    Hematology Recent Labs  Lab 04/26/23 1212 05/02/23 1041 05/03/23 0309  WBC 6.7 9.3 8.2  RBC 5.12 4.52 4.02*  HGB 12.4* 11.2* 9.9*  HCT 40.9 36.1* 31.7*  MCV 79.9* 79.9* 78.9*  MCH 24.2* 24.8* 24.6*  MCHC 30.3 31.0 31.2  RDW 18.6* 18.7* 18.6*  PLT 261 222 220   Thyroid No results for input(s): "TSH", "FREET4" in the last 168 hours.  BNP Recent Labs  Lab 04/26/23 1214  BNP 137.3*    DDimer  Recent Labs  Lab 05/01/23 0925  DDIMER 0.50     Radiology    No results found.  Cardiac Studies   Echocardiogram 04/27/2023 1. Left ventricular ejection fraction, by estimation, is 55 to 60%. The  left ventricle has normal function. The left ventricle has no regional  wall motion abnormalities. There is mild concentric left ventricular  hypertrophy. Left ventricular diastolic  parameters are consistent with Grade  II diastolic dysfunction  (pseudonormalization).   2. Right ventricular systolic function is normal. The right ventricular  size is normal. Tricuspid regurgitation signal is inadequate for assessing  PA pressure.   3. The mitral valve is normal in structure. No evidence of mitral valve  regurgitation. No evidence of mitral stenosis.   4. The aortic valve appears functionally bicuspid with fused left and  right coronary cusps. There is moderate calcification of the aortic valve.  Aortic valve regurgitation is not visualized. Aortic valve  sclerosis/calcification is present, without any  evidence of aortic stenosis. Aortic valve mean gradient measures 9.0 mmHg.   5. Aortic dilatation noted. There is moderate dilatation of the aortic  root, measuring 44 mm. There is moderate dilatation of the ascending  aorta, measuring 45 mm.   6. The inferior vena cava is dilated in size with >50% respiratory  variability, suggesting right atrial pressure of 8 mmHg.    Right and left heart catheterization 06/17/2022 Mid LM to Ost LAD lesion is 80% stenosed with 40% stenosed side branch in Ost Cx to Prox Cx.   Mid Cx-2 lesion is 5% stenosed.   Dist Cx lesion is 70% stenosed.   Mid Cx-1 lesion is 100% stenosed.   Prox RCA to Mid RCA lesion is 30% stenosed.   Dist RCA lesion is 50% stenosed with 50% stenosed side branch in RPDA.   Prox RCA lesion is 80% stenosed.   Prox Graft to Mid Graft lesion is 30% stenosed.   1st Diag lesion is 40% stenosed.   3rd Diag lesion is 60% stenosed.   Mid LAD lesion is 50% stenosed.   SVG and is large.   SVG and is normal in caliber.   SVG and is large.   The graft exhibits no disease.   The graft exhibits no disease.   Severe native CAD with 80% distal left main stenosis with widely patent LIMA graft supplying the LAD and SVG supplying the diagonal vessel.   The native circumflex is totally occluded with a widely patent vein graft  supplying the distal OM vessel.    The native dominant RCA has 80% focal proximal stenosis with mild in-stent restenosis in the previously placed mid stent with 50% narrowing at the ostium of the PDA with widely patent SVG to PDA.   Normal right heart pressures with mean PA pressure at 21 mmHg/   RECOMMENDATION: Medical therapy.  Continue long-term DAPT.  Optimal blood pressure control with target blood pressure less than 130/80.  Aggressive lipid-lowering therapy with target LDL less than 55.  Patient Profile     52 y.o. male with PMH chronic diastolic heart failure, CAD with PCI 2017 and CABG 2021, bicuspid aortic valve with TAA, PAD and type II diabetes s/p bilateral BKA, chronic kidney disease stage 3a. He presented with hypervolemia and was admitted for diuresis.   Assessment & Plan    1. Possible acute on chronic diastolic CHF   - appears to be euvolemic on 60mg  daily torsemide. Cr trended up from 1.5 to 2.2 however appears to have reached plateau, will continue on current diuretic. No JVD. Lung clear on exam  2. Acute on chronic renal insufficiency  - baseline Cr 1.5, current Cr reached plateau of 2.2 and stabilized. Need close outpatient follow up with nephrology  3. CAD: has chest pain for years prior to the last 2 coronary angiogram. On lifelong DAPT  4. Hyperlipidemia 5. Bicuspid aortic valve 6. DM II 7. Bilateral BKA: secondary to uncontrolled DMII      For questions or updates, please contact [facility] Please consult [email or web address] for contact info under        Signed, Azalee Course, PA  05/03/2023, 9:41 AM

## 2023-05-03 NOTE — Discharge Summary (Signed)
Family Medicine Teaching [facility] Discharge Summary  Patient name: Joseph [name] Medical record number: [id number] Date of birth: 1971-11-25 Age: 52 y.o. Gender: male Date of Admission: 04/26/2023  Date of Discharge: 05/03/2023 Admitting Physician: Westley Chandler, MD  Primary Care Provider: Marva Panda, NP Consultants: Nephrology, Cardiology  Indication for Hospitalization: Hypervolemia  Discharge Diagnoses/Problem List:  Principal Problem for Admission: Hypervolemia Other Problems addressed during stay:  Principal Problem:   Nephrosis Active Problems:   Acute renal failure superimposed on stage 3a chronic kidney disease (HCC)   Heart failure with preserved ejection fraction (HCC)   Insulin dependent type 2 diabetes mellitus (HCC)   Recurrent chest pain   Hypotension   Below-knee amputation of left lower extremity (HCC)   CHF (congestive heart failure) (HCC)   Weakness   AKI (acute kidney injury) [facility])  Brief Hospital Course:  Joseph [name] is a 52 y.o.male with a history of T2DM, bilateral BKA d/t diabetic foot, HLD, CAD s/p CABG, PE, CHF, PAD, aortic aneurysm, COPD, anxiety, GERD, HTN, and OSA who was admitted to the [facility] Medicine Teaching Service at [facility] for hypervolemia. His hospital course is detailed below:  Hypervolemia HF exacerbation Nephrotic syndrome Admitted with global hypervolemia or non-pitting edema in UE and LE for unknown period of time. Patient diuresed with IV Lasix 80mg  BID with good UOP. PC ratio elevated to 5.89, concern for nephrotic syndrome. Nephrology consulted and recommended repeat UA, PCR, SPEP and light chains. UA and PCR confirmed significant proteinuria, likely in the setting of diabetic kidney disease. Patient CrCl worsening and he was switched to Torsemide 60mg  daily for diuresis. Started on Farxiga 10mg  daily. Good UOP although non-pitting edema did not markedly improve. Mildly soft BP, home meds adjusted including holding  Amlodipine and decreasing to Coreg 6.25mg  BID. CrCl plateaued and remained stable at the time of discharge. Plan for continued diuresis and close Nephrology follow up outpatient.  H/o multiple PEs Patient reports two prior Pes, possibly unprovoked but unclear from documentation and patient reporting. Cardiologist recommended outpatient hematology follow up to discuss anticoagulation.  T2DM with bilateral BKA Superficial ulceration on L BKA site, patient received inpatient wound care with improvement. Patient insulin regimen titrated to LAI 35U BID and mSSI.  Recurrent chest pain Unstable angina Intermittent episodes of chest pain that resolved within 30 minutes during hospital course. CAD work up negative. CXR normal. Low concern for PE given quick resolution, no vitals changes or SOB. Cardiology consulted and did not advise additional work up. Continued on home Ranolazine.  Other chronic conditions were medically managed with home medications and formulary alternatives as necessary (anxiety, HLD, PAD, GERD, neuropathy, OSA, HTN, angina)  PCP Follow-up Recommendations:  Outpatient heme follow up to discuss need for anticoagulation Encourage outpatient nephrology follow up Held Amlodipine, consider restarting Watch volume status, patient may need increased dose of diuretic when returns to home diet DM management, LAI increased to 35U BID   Disposition: Home  Discharge Condition: Stable  Discharge Exam:  Vitals:   05/03/23 0247 05/03/23 0828  BP: 113/75 108/64  Pulse: 75 73  Resp:  17  Temp:  (!) 97.5 F (36.4 C)  SpO2: 97% 90%   General: Sitting up in bed, NAD Cardiovascular: RRR without murmur Respiratory: CTAB. Normal WOB on RA Abdomen: Large pannus, soft Extremities: Stable non-pitting edema of upper and lower extremities  Significant Procedures: None  Significant Labs and Imaging:  Recent Labs  Lab 05/02/23 1041 05/03/23 0309  WBC 9.3 8.2  HGB 11.2* 9.9*  HCT  36.1* 31.7*  PLT 222 220   Recent Labs  Lab 05/02/23 1041 05/03/23 0309  NA 135 137  K 4.4 4.1  CL 98 99  CO2 27 29  GLUCOSE 177* 200*  BUN 53* 55*  CREATININE 2.25* 2.23*  CALCIUM 8.6* 8.2*    Pertinent Imaging: DG CHEST PORT 1 VIEW Result Date: 05/01/2023 CLINICAL DATA:  Shortness of breath EXAM: PORTABLE CHEST 1 IMPRESSION: No active disease.   ECHOCARDIOGRAM COMPLETE  Result Date: 04/27/2023  FINDINGS  Left Ventricle: Left ventricular ejection fraction, by estimation, is 55 to 60%. The left ventricle has normal function. The left ventricle has no regional wall motion abnormalities. Definity contrast agent was given IV to delineate the left ventricular  endocardial borders. The left ventricular internal cavity size was normal in size. There is mild concentric left ventricular hypertrophy. Left ventricular diastolic parameters are consistent with Grade II diastolic dysfunction (pseudonormalization). Right Ventricle: The right ventricular size is normal. No increase in right ventricular wall thickness. Right ventricular systolic function is normal. Tricuspid regurgitation signal is inadequate for assessing PA pressure. Left Atrium: Left atrial size was normal in size. Right Atrium: Right atrial size was normal in size. Pericardium: There is no evidence of pericardial effusion. Mitral Valve: The mitral valve is normal in structure. There is mild calcification of the mitral valve leaflet(s). Mild mitral annular calcification. No evidence of mitral valve regurgitation. No evidence of mitral valve stenosis. MV peak gradient, 6.2 mmHg. The mean mitral valve gradient is 4.0 mmHg. Tricuspid Valve: The tricuspid valve is normal in structure. Tricuspid valve regurgitation is not demonstrated. Aortic Valve: The aortic valve is bicuspid. There is moderate calcification of the aortic valve. Aortic valve regurgitation is not visualized. Aortic valve sclerosis/calcification is present, without any evidence  of aortic stenosis. Aortic valve mean gradient measures 9.0 mmHg. Aortic valve peak gradient measures 15.8 mmHg. Aortic valve area, by VTI measures 2.83 cm. Pulmonic Valve: The pulmonic valve was normal in structure. Pulmonic valve regurgitation is not visualized. Aorta: Aortic dilatation noted. There is moderate dilatation of the aortic root, measuring 44 mm. There is moderate dilatation of the ascending aorta, measuring 45 mm. Venous: The inferior vena cava is dilated in size with greater than 50% respiratory variability, suggesting right atrial pressure of 8 mmHg. IAS/Shunts: No atrial level shunt detected by color flow Doppler.    DG Chest 2 View Result Date: 04/26/2023 IMPRESSION: No acute cardiopulmonary findings.  Results/Tests Pending at Time of Discharge: None  Discharge Medications:  Allergies as of 05/03/2023       Reactions   Sulfa Antibiotics Anaphylaxis, Other (See Comments)   Headaches        Medication List     STOP taking these medications    amLODipine 10 MG tablet Commonly known as: NORVASC   furosemide 40 MG tablet Commonly known as: LASIX   insulin glargine-yfgn 100 UNIT/ML injection Commonly known as: SEMGLEE   meclizine 25 MG tablet Commonly known as: ANTIVERT   rosuvastatin 40 MG tablet Commonly known as: CRESTOR   Tums Extra Strength 750 750 MG chewable tablet Generic drug: calcium carbonate       TAKE these medications    acetaminophen 500 MG tablet Commonly known as: TYLENOL Take 1,000 mg by mouth 3 (three) times daily as needed for headache.   albuterol 108 (90 Base) MCG/ACT inhaler Commonly known as: VENTOLIN HFA Inhale 2 puffs into the lungs every 6 (six) hours as needed for shortness  of breath.   ALPRAZolam 1 MG tablet Commonly known as: XANAX Take 2 mg by mouth at bedtime.   aspirin EC 81 MG tablet Take 81 mg by mouth in the morning. Swallow whole.   atorvastatin 80 MG tablet Commonly known as: LIPITOR Take 1 tablet (80 mg  total) by mouth daily at 6 PM.   buPROPion 150 MG 24 hr tablet Commonly known as: WELLBUTRIN XL Take 150 mg by mouth in the morning and at bedtime.   carvedilol 6.25 MG tablet Commonly known as: Coreg Take 1 tablet (6.25 mg total) by mouth 2 (two) times daily. What changed:  medication strength how much to take   clopidogrel 75 MG tablet Commonly known as: PLAVIX Take 1 tablet (75 mg total) by mouth daily with breakfast.   dapagliflozin propanediol 10 MG Tabs tablet Commonly known as: FARXIGA Take 1 tablet (10 mg total) by mouth daily. Start taking on: May 04, 2023   escitalopram 10 MG tablet Commonly known as: LEXAPRO Take 10 mg by mouth daily.   fluconazole 100 MG tablet Commonly known as: DIFLUCAN Take 1 tablet by mouth once a week.   fluticasone 50 MCG/ACT nasal spray Commonly known as: FLONASE Place 2 sprays into both nostrils daily.   FreeStyle Libre 14 Day Sensor Misc Apply topically 4 (four) times daily.   insulin aspart 100 UNIT/ML injection Commonly known as: novoLOG Inject 3 Units into the skin 3 (three) times daily with meals.   Lantus SoloStar 100 UNIT/ML Solostar Pen Generic drug: insulin glargine Inject 35 Units into the skin 2 (two) times daily. What changed: how much to take   loratadine 10 MG tablet Commonly known as: CLARITIN Take 10 mg by mouth daily.   metFORMIN 1000 MG tablet Commonly known as: GLUCOPHAGE Take 1 tablet (1,000 mg total) by mouth 2 (two) times daily with a meal.   naloxone 4 MG/0.1ML Liqd nasal spray kit Commonly known as: NARCAN Place 4 mg into the nose as needed (overdose).   nitroGLYCERIN 0.4 MG SL tablet Commonly known as: NITROSTAT Place 1 tablet (0.4 mg total) under the tongue every 5 (five) minutes as needed. What changed: reasons to take this   oxyCODONE 15 MG immediate release tablet Commonly known as: ROXICODONE Take 1 tablet (15 mg total) by mouth every 4 (four) hours as needed for moderate pain (pain  score 4-6).   pantoprazole 40 MG tablet Commonly known as: PROTONIX Take 1 tablet (40 mg total) by mouth daily.   pregabalin 150 MG capsule Commonly known as: LYRICA Take 150 mg by mouth 3 (three) times daily.   promethazine 25 MG tablet Commonly known as: PHENERGAN Take 25 mg by mouth every 4 (four) hours as needed for nausea or vomiting.   ranolazine 500 MG 12 hr tablet Commonly known as: RANEXA TAKE 1 TABLET(500 MG) BY MOUTH TWICE DAILY What changed: See the new instructions.   torsemide 20 MG tablet Commonly known as: DEMADEX Take 3 tablets (60 mg total) by mouth daily. Start taking on: May 04, 2023   Ubrelvy 100 MG Tabs Generic drug: Ubrogepant Take 1 tablet by mouth daily as needed (For migraine).   Vascepa 1 g capsule Generic drug: icosapent Ethyl Take 2 capsules (2 g total) by mouth 2 (two) times daily.        Discharge Instructions: Please refer to Patient Instructions section of EMR for full details.  Patient was counseled important signs and symptoms that should prompt return to medical care, changes in medications,  dietary instructions, activity restrictions, and follow up appointments.   Follow-Up Appointments:  Follow-up Information     [facility] Follow up.   Specialty: Rehabilitation Why: Call to schedule first visit. Contact information: [address] [id number] mc [address] [place] [address] [phone]        Marva Panda, NP. Schedule an appointment as soon as possible for a visit in 1 week(s).   Contact information: [address] [name] [address] [phone]                 [name], MD 05/03/2023, 12:46 PM PGY-1, [facility]

## 2023-05-03 NOTE — Progress Notes (Signed)
Heart Failure Navigator Progress Note  Assessed for Heart & Vascular TOC clinic readiness.  Patient does not meet criteria due to Chronic EF 55-60%. Has a appointment with Dr. Duke Salvia office on 06/07/2023. .   Navigator available for reassessment of patient.   Rhae Hammock, BSN, Scientist, clinical (histocompatibility and immunogenetics) Only

## 2023-05-03 NOTE — Progress Notes (Signed)
Physical Therapy Treatment Patient Details Name: Joseph [name] MRN: [id number] DOB: 07/28/1971 Today's Date: 05/03/2023   History of Present Illness Pt is a 52 y/o male who presents 04/26/23 per his cardiologist's request 2 abnormal labs and volume overload. PMH signficant for AAA aneurysm, anxiety, asthma, CAD, COPD, DM, DVT, HNT, migraine, morbid obesity, MI, PE, peripheral nerve disease, B BKA with prosthetics, CABG, bilateral shoulder open rotator cuff repair.    PT Comments    Pt greeted sitting up long sitting in bed and agreeable to session. Pt able to continue to demonstrate anterior/posterior scooting without physical assist. Pt agreeable to seated exercises with cues for technique and hands on assist for increased ROM. Educated pt on importance of continued mobility, encouraging knee extension and neutral rotation at rest as well as pressure relieving techniques with pt verbalizing understanding and able to demonstrate all techniques back. Pt continues to benefit from skilled PT services to progress toward functional mobility goals.    Recommendations for follow up therapy are one component of a multi-disciplinary discharge planning process, led by the attending physician.  Recommendations may be updated based on patient status, additional functional criteria and insurance authorization.  Follow Up Recommendations       Assistance Recommended at Discharge Set up Supervision/Assistance  Patient can return home with the following A little help with walking and/or transfers;Assistance with cooking/housework;Assist for transportation;Help with stairs or ramp for entrance   Equipment Recommendations  Rolling walker (2 wheels) (Bariatric size and to accommodate 6'5" height)    Recommendations for Other Services       Precautions / Restrictions Precautions Precautions: Fall Required Braces or Orthoses: Other Brace Other Brace: Prosthetics, not present in room Restrictions Weight  Bearing Restrictions: Yes LLE Weight Bearing: Non weight bearing Other Position/Activity Restrictions: due to new wound     Mobility  Bed Mobility Overal bed mobility: Modified Independent             General bed mobility comments: Pt was able to transition to/from EOB without assist. HOB elevated.    Transfers Overall transfer level: Needs assistance Equipment used: None Transfers: Bed to chair/wheelchair/BSC         Anterior-Posterior transfers: Supervision   General transfer comment: able to scoot anterior/posterior in bed    Ambulation/Gait               General Gait Details: unable   Stairs             Wheelchair Mobility    Modified Rankin (Stroke Patients Only)       Balance Overall balance assessment: Modified Independent Sitting-balance support: No upper extremity supported, Feet unsupported Sitting balance-Leahy Scale: Good                                      Cognition Arousal/Alertness: Awake/alert Behavior During Therapy: WFL for tasks assessed/performed Overall Cognitive Status: Within Functional Limits for tasks assessed                                          Exercises Amputee Exercises Quad Sets: AROM, Right, Left, 20 reps Knee Flexion: AROM, Right, Left, 10 reps Straight Leg Raises: AROM, Right, Left, 5 reps Other Exercises Other Exercises: long axis IR/ER x 10 ea side    General Comments General comments (  skin integrity, edema, etc.): VSS on RA      Pertinent Vitals/Pain Pain Assessment Pain Assessment: Faces Faces Pain Scale: Hurts little more Pain Location: R shoulder--chronic Pain Descriptors / Indicators: Discomfort Pain Intervention(s): Monitored during session, Limited activity within patient's tolerance    Home Living                          Prior Function            PT Goals (current goals can now be found in the care plan section) Acute Rehab PT  Goals Patient Stated Goal: to go home PT Goal Formulation: With patient Time For Goal Achievement: 05/04/23 Progress towards PT goals: Progressing toward goals    Frequency    Min 2X/week      PT Plan      Co-evaluation              AM-PAC PT "6 Clicks" Mobility   Outcome Measure  Help needed turning from your back to your side while in a flat bed without using bedrails?: None Help needed moving from lying on your back to sitting on the side of a flat bed without using bedrails?: None Help needed moving to and from a bed to a chair (including a wheelchair)?: A Little Help needed standing up from a chair using your arms (e.g., wheelchair or bedside chair)?: A Lot Help needed to walk in hospital room?: A Lot Help needed climbing 3-5 steps with a railing? : Total 6 Click Score: 16    End of Session   Activity Tolerance: Patient tolerated treatment well Patient left: in bed;with call bell/phone within reach Nurse Communication: Mobility status PT Visit Diagnosis: Other abnormalities of gait and mobility (R26.89);Difficulty in walking, not elsewhere classified (R26.2) Pain - part of body: Shoulder;Leg     Time: 7829-5621 PT Time Calculation (min) (ACUTE ONLY): 24 min  Charges:  $Therapeutic Exercise: 8-22 mins $Therapeutic Activity: 8-22 mins                    Hortencia Martire R. PTA Acute Rehabilitation Services Office: [phone]    Catalina Antigua 05/03/2023, 1:37 PM

## 2023-05-03 NOTE — Progress Notes (Signed)
DISCHARGE NOTE HOME Joseph [name] [place] to be discharged Home per MD order. Discussed prescriptions and follow up appointments with the patient. Prescriptions given to patient; medication list explained in detail. Patient verbalized understanding.  Skin clean, dry and intact without evidence of skin break down, no evidence of skin tears noted. IV catheter discontinued intact. Site without signs and symptoms of complications. Dressing and pressure applied. Pt denies pain at the site currently. No complaints noted.  Patient free of lines, drains, and wounds.   An After Visit Summary (AVS) was printed and given to the patient. Patient escorted via wheelchair, and discharged home via private auto.  Velia Meyer, RNDISCHARGE NOTE HOME Joseph [name] to be discharged Home per MD order. Discussed prescriptions and follow up appointments with the patient. Prescriptions given to patient; medication list explained in detail. Patient verbalized understanding.  Skin clean, dry and intact without evidence of skin break down, no evidence of skin tears noted. IV catheter discontinued intact. Site without signs and symptoms of complications. Dressing and pressure applied. Pt denies pain at the site currently. No complaints noted.  Patient free of lines, drains, and wounds.   An After Visit Summary (AVS) was printed and given to the patient. Patient escorted via wheelchair, and discharged home via private auto.  Velia Meyer, RN

## 2023-05-03 NOTE — Plan of Care (Signed)
  Problem: Nutritional: Goal: Maintenance of adequate nutrition will improve Outcome: Progressing   Problem: Skin Integrity: Goal: Risk for impaired skin integrity will decrease Outcome: Progressing   Problem: Tissue Perfusion: Goal: Adequacy of tissue perfusion will improve Outcome: Progressing   Problem: Activity: Goal: Capacity to carry out activities will improve Outcome: Progressing   Problem: Clinical Measurements: Goal: Respiratory complications will improve Outcome: Progressing Goal: Cardiovascular complication will be avoided Outcome: Progressing   Problem: Activity: Goal: Risk for activity intolerance will decrease Outcome: Progressing   Problem: Nutrition: Goal: Adequate nutrition will be maintained Outcome: Progressing   Problem: Coping: Goal: Level of anxiety will decrease Outcome: Progressing   Problem: Elimination: Goal: Will not experience complications related to bowel motility Outcome: Progressing Goal: Will not experience complications related to urinary retention Outcome: Progressing

## 2023-05-03 NOTE — TOC Transition Note (Signed)
Transition of Care [facility]) - CM/SW Discharge Note   Patient Details  Name: DARROL BABIN MRN: [id number] Date of Birth: 15-Jan-1971  [facility] CM/SW Contact:  Tom-Johnson, Hershal Coria, RN Phone Number: 05/03/2023, 12:46 PM   Clinical Narrative:     Patient is scheduled for discharge today.  Readmission Risk Assessment done. Outpatient referrals, hospital f/u and discharge instructions on AVS. Prescriptions sent to [facility] pharmacy and meds will be delivered to patient at bedside prior discharge. PTAR scheduled for transportation at discharge.  No further TOC needs noted.  Final next level of care: OP Rehab Barriers to Discharge: Barriers Resolved   Patient Goals and CMS Choice CMS Medicare.gov Compare Post Acute Care list provided to:: Patient Choice offered to / list presented to : Patient, Spouse  Discharge Placement                  Patient to be transferred to facility by: PTAR      Discharge Plan and Services Additional resources added to the After Visit Summary for     Discharge Planning Services: CM Consult Post Acute Care Choice: NA (Outpatient)          DME Arranged: Dan Humphreys tall (Bariatric) DME Agency: NA       HH Arranged: NA HH Agency: NA        Social Determinants of Health (SDOH) Interventions SDOH Screenings   Food Insecurity: No Food Insecurity (09/09/2022)  Housing: Medium Risk (09/09/2022)  Transportation Needs: Unmet Transportation Needs (09/09/2022)  Utilities: Not At Risk (09/09/2022)  Depression (PHQ2-9): Low Risk  (05/06/2021)  Financial Resource Strain: Medium Risk (09/09/2022)  Tobacco Use: Medium Risk (04/26/2023)     Readmission Risk Interventions    05/03/2023   11:58 AM 11/24/2021    4:05 PM 08/20/2021   12:26 PM  Readmission Risk Prevention Plan  Transportation Screening Complete Complete Complete  PCP or Specialist Appt within 3-5 Days Complete    HRI or Home Care Consult Complete    Social Work Consult for  Recovery Care Planning/Counseling Complete    Palliative Care Screening Not Applicable    Medication Review Oceanographer) Referral to Pharmacy Complete Complete  PCP or Specialist appointment within 3-5 days of discharge  Complete Complete  HRI or Home Care Consult  Complete Complete  SW Recovery Care/Counseling Consult  Complete Complete  Palliative Care Screening  Not Applicable Not Applicable  Skilled Nursing Facility  Complete Not Applicable

## 2023-05-04 LAB — IMMUNOFIXATION ELECTROPHORESIS
IgA: 244 mg/dL (ref 90–386)
IgG (Immunoglobin G), Serum: 683 mg/dL (ref 603–1613)
IgM (Immunoglobulin M), Srm: 100 mg/dL (ref 20–172)
Total Protein ELP: 5 g/dL — ABNORMAL LOW (ref 6.0–8.5)

## 2023-05-17 ENCOUNTER — Ambulatory Visit (HOSPITAL_COMMUNITY): Admission: RE | Admit: 2023-05-17 | Payer: Medicare HMO | Source: Ambulatory Visit

## 2023-05-18 ENCOUNTER — Ambulatory Visit: Payer: Medicare HMO | Admitting: Physical Therapy

## 2023-05-27 ENCOUNTER — Ambulatory Visit: Payer: Medicare HMO | Attending: Family Medicine | Admitting: Physical Therapy

## 2023-05-27 DIAGNOSIS — M6281 Muscle weakness (generalized): Secondary | ICD-10-CM | POA: Insufficient documentation

## 2023-05-27 DIAGNOSIS — R531 Weakness: Secondary | ICD-10-CM | POA: Insufficient documentation

## 2023-05-27 DIAGNOSIS — R2689 Other abnormalities of gait and mobility: Secondary | ICD-10-CM | POA: Insufficient documentation

## 2023-05-27 DIAGNOSIS — R2681 Unsteadiness on feet: Secondary | ICD-10-CM | POA: Insufficient documentation

## 2023-05-27 NOTE — Therapy (Signed)
OUTPATIENT PHYSICAL THERAPY NEURO EVALUATION   Patient Name: Joseph [name] MRN: [id number] DOB:05-17-1971, 52 y.o., male Today's Date: 05/28/2023   PCP: Lucile Crater FNP-BC ([facility]) REFERRING PROVIDER: Carney Living, MD  END OF SESSION:  PT End of Session - 05/27/23 1021     Visit Number 1    Number of Visits 17   with eval   Date for PT Re-Evaluation 08/19/23    Authorization Type [employer]    Progress Note Due on Visit 10    PT Start Time 1020   pt arrived late   PT Stop Time 1059    PT Time Calculation (min) 39 min    Activity Tolerance Patient tolerated treatment well    Behavior During Therapy WFL for tasks assessed/performed             Past Medical History:  Diagnosis Date   Aneurysm of ascending aorta (HCC) 09/14/2021   Anxiety    Arthritis    "knees, shoulders, hips, ankles" (07/29/2016)   Asthma    Bicuspid aortic valve 09/14/2021   CAD (coronary artery disease)    a. 2017: s/p BMS to distal Cx; b. LHC 05/30/2019: 80% mid, distal RCA s/p DES, 30% narrowing of d LM, widely patent LAD w/ luminal irregularities, widely patent stent in dCX  w/ 90+% stenosis distal to stent beofre small trifurcating obtuse marginal (potentially area of restenosis)   Chronic bronchitis (HCC)    Chronic ulcer of great toe of right foot (HCC) 09/09/2017   Chronic ulcer of right great toe (HCC) 09/19/2017   COPD (chronic obstructive pulmonary disease) (HCC)    Coronary arteriosclerosis    Depression    Diabetes mellitus (HCC)    DVT (deep venous thrombosis) (HCC)    GERD (gastroesophageal reflux disease)    Hyperlipidemia    Hypertension    Migraine    Morbid obesity (HCC)    Myocardial infarction (HCC) 1996   "light one"   PE (pulmonary embolism) 04/2013   On chronic Xarelto   Peripheral nerve disease    Pneumonia "several times"   Sleep apnea    Type II diabetes mellitus (HCC)    Past Surgical History:  Procedure Laterality Date   ABDOMINAL  AORTOGRAM W/LOWER EXTREMITY N/A 10/10/2020   Procedure: ABDOMINAL AORTOGRAM W/LOWER EXTREMITY;  Surgeon: Sherren Kerns, MD;  Location: [facility];  Service: Cardiovascular;  Laterality: N/A;   AMPUTATION Right 09/21/2017   Procedure: RIGHT GREAT TOE AMPUTATION, POSSIBLE VAC;  Surgeon: Tarry Kos, MD;  Location: [facility] OR;  Service: Orthopedics;  Laterality: Right;   AMPUTATION Left 08/13/2020   Procedure: LEFT FOOT 4TH RAY AMPUTATION;  Surgeon: [name], MD;  Location: [facility] OR;  Service: Orthopedics;  Laterality: Left;   AMPUTATION Left 11/20/2021   Procedure: LEFT TRANSMETATARSAL AMPUTATION;  Surgeon: [name], MD;  Location: [facility] OR;  Service: Orthopedics;  Laterality: Left;   AMPUTATION Left 01/08/2022   Procedure: LEFT BELOW KNEE AMPUTATION;  Surgeon: [name], MD;  Location: [facility] OR;  Service: Orthopedics;  Laterality: Left;   AMPUTATION Right 07/07/2022   Procedure: RIGHT BELOW KNEE AMPUTATION;  Surgeon: [name], MD;  Location: [facility] OR;  Service: Orthopedics;  Laterality: Right;   AORTOGRAM Bilateral 03/13/2021   Procedure: ABDOMINAL AORTOGRAM WITH Left LOWER EXTREMITY RUNOFF;  Surgeon: Leonie Douglas, MD;  Location: [facility] OR;  Service: Vascular;  Laterality: Bilateral;   APPLICATION OF WOUND VAC  01/08/2022   Procedure: APPLICATION  OF WOUND VAC;  Surgeon: [name], MD;  Location: [facility] OR;  Service: Orthopedics;;   APPLICATION OF WOUND VAC Right 07/07/2022   Procedure: APPLICATION OF WOUND VAC;  Surgeon: [name], MD;  Location: [facility] OR;  Service: Orthopedics;  Laterality: Right;   CARDIAC CATHETERIZATION  2006   CARDIAC CATHETERIZATION  1996   "@ [facility]; when I had my heart attack"   CARDIAC CATHETERIZATION N/A 07/29/2016   Procedure: Left Heart Cath and Coronary Angiography;  Surgeon: Runell Gess, MD;  Location: [facility] INVASIVE CV LAB;  Service: Cardiovascular;  Laterality: N/A;   CARDIAC CATHETERIZATION N/A 07/29/2016   Procedure: Coronary Stent Intervention;   Surgeon: Runell Gess, MD;  Location: [facility];  Service: Cardiovascular;  Laterality: N/A;   CARPAL TUNNEL RELEASE Bilateral    CORONARY ANGIOPLASTY WITH STENT PLACEMENT  07/29/2016   CORONARY ARTERY BYPASS GRAFT N/A 07/24/2020   Procedure: CORONARY ARTERY BYPASS GRAFTING (CABG), ON PUMP, TIMES FOUR, USING LEFT INTERNAL MAMMARY ARTERY AND ENDOSCOPICALLY HARVESTED RIGHT GREATER SAPHENOUS VEIN;  Surgeon: Corliss Skains, MD;  Location: [facility] OR;  Service: Open Heart Surgery;  Laterality: N/A;  FLOW TAC   CORONARY PRESSURE/FFR STUDY N/A 07/22/2020   Procedure: INTRAVASCULAR PRESSURE WIRE/FFR STUDY;  Surgeon: Marykay Lex, MD;  Location: [facility] INVASIVE CV LAB;  Service: Cardiovascular;  Laterality: N/A;   CORONARY STENT INTERVENTION N/A 05/30/2019   Procedure: CORONARY STENT INTERVENTION;  Surgeon: Lyn Records, MD;  Location: [facility];  Service: Cardiovascular;  Laterality: N/A;   CORONARY STENT INTERVENTION N/A 11/02/2019   Procedure: CORONARY STENT INTERVENTION;  Surgeon: Iran Ouch, MD;  Location: [facility];  Service: Cardiovascular;  Laterality: N/A;   ESOPHAGOGASTRODUODENOSCOPY N/A 09/22/2017   Procedure: ESOPHAGOGASTRODUODENOSCOPY (EGD);  Surgeon: Meryl Dare, MD;  Location: [facility] ENDOSCOPY;  Service: Endoscopy;  Laterality: N/A;   KNEE ARTHROSCOPY Bilateral    "2 on left; 1 on the right"   LEFT HEART CATH AND CORONARY ANGIOGRAPHY N/A 11/02/2019   Procedure: LEFT HEART CATH AND CORONARY ANGIOGRAPHY;  Surgeon: Iran Ouch, MD;  Location: [facility];  Service: Cardiovascular;  Laterality: N/A;   LEFT HEART CATH AND CORONARY ANGIOGRAPHY N/A 07/22/2020   Procedure: LEFT HEART CATH AND CORONARY ANGIOGRAPHY;  Surgeon: Marykay Lex, MD;  Location: [facility] INVASIVE CV LAB;  Service: Cardiovascular;  Laterality: N/A;   LEFT HEART CATH AND CORS/GRAFTS ANGIOGRAPHY N/A 08/17/2021   Procedure: LEFT HEART CATH AND CORS/GRAFTS ANGIOGRAPHY;  Surgeon: Yvonne Kendall, MD;  Location: [facility];  Service: Cardiovascular;  Laterality: N/A;   PERIPHERAL VASCULAR INTERVENTION Left 10/11/2020   popliteal and SFA stent placement    PERIPHERAL VASCULAR INTERVENTION Left 10/10/2020   Procedure: PERIPHERAL VASCULAR INTERVENTION;  Surgeon: Sherren Kerns, MD;  Location: [facility];  Service: Cardiovascular;  Laterality: Left;   RIGHT/LEFT HEART CATH AND CORONARY ANGIOGRAPHY N/A 05/30/2019   Procedure: RIGHT/LEFT HEART CATH AND CORONARY ANGIOGRAPHY;  Surgeon: Lyn Records, MD;  [facility]: [facility];  Service: Cardiovascular;  Laterality: N/A;   RIGHT/LEFT HEART CATH AND CORONARY/GRAFT ANGIOGRAPHY N/A 06/17/2022   Procedure: RIGHT/LEFT HEART CATH AND CORONARY/GRAFT ANGIOGRAPHY;  Surgeon: Lennette Bihari, MD;  Location: [facility];  Service: Cardiovascular;  Laterality: N/A;   SHOULDER OPEN ROTATOR CUFF REPAIR Bilateral    TEE WITHOUT CARDIOVERSION N/A 07/24/2020   Procedure: TRANSESOPHAGEAL ECHOCARDIOGRAM (TEE);  Surgeon: Corliss Skains, MD;  Location: [facility] OR;  Service: Open Heart Surgery;  Laterality: N/A;   Patient Active Problem List   Diagnosis Date Noted   AKI (acute kidney injury) (HCC) 04/29/2023   Weakness 04/28/2023   Nephrosis 04/27/2023   CHF (congestive heart failure) (HCC) 04/26/2023   OSA (obstructive sleep apnea) 04/26/2023   Preoperative cardiovascular examination 04/25/2023   Hx of iron deficiency anemia 07/07/2022   Syncope and collapse 06/15/2022   AMS (altered mental status) 04/14/2022   Acute respiratory failure with hypoxia (HCC) 04/14/2022   Acute metabolic encephalopathy 04/14/2022   Pneumonia 04/14/2022   Ulcer of right foot, limited to breakdown of skin (HCC)    Below-knee amputation of left lower extremity (HCC)    Osteomyelitis of foot, right, acute (HCC) 01/06/2022   Bipolar disorder with depression (HCC)    Sepsis (HCC) 11/18/2021   Chest pain 11/18/2021   Dyspnea 11/18/2021    Wound infection    Ulcer of left foot with necrosis of bone (HCC)    Bicuspid aortic valve 09/14/2021   Aneurysm of ascending aorta (HCC) 09/14/2021   DM (diabetes mellitus) with peripheral vascular complication (HCC) 08/20/2021   Atypical chest pain 07/17/2021   Hyponatremia 07/17/2021   Leg wound, left 07/17/2021   Essential hypertension 07/17/2021   Acute hyperglycemia 07/09/2021   Non-ST elevation (NSTEMI) myocardial infarction (HCC) 07/22/2020   Heart failure with preserved ejection fraction (HCC) 07/22/2020   Hypotension 07/18/2020   Elevated troponin 07/18/2020   Acute renal failure superimposed on stage 3a chronic kidney disease (HCC) 07/18/2020   Elevated brain natriuretic peptide (BNP) level 07/18/2020   Edema 06/13/2019   Leukocytosis 05/31/2019   Smoker 03/08/2019   Chronic back pain 02/16/2019   Deep venous thrombosis (HCC) 02/16/2019   Obesity, Class III, BMI 40-49.9 (morbid obesity) (HCC) 02/16/2019   Peripheral nerve disease 02/16/2019   COPD (chronic obstructive pulmonary disease) (HCC) 01/22/2019   Anxiety and depression 03/26/2018   Sinusitis 10/12/2017   Abdominal pain    Loss of weight    Diabetic foot infection (HCC) 09/16/2017   Cellulitis of right foot    Recurrent chest pain 07/29/2016   Hyperglycemia    Insulin dependent type 2 diabetes mellitus (HCC) 04/29/2015   History of pulmonary embolus (PE) 04/27/2013   CAD (coronary artery disease) 09/10/2008    ONSET DATE: 04/27/2023  REFERRING DIAG: R53.1 (ICD-10-CM) - Weakness  THERAPY DIAG:  Muscle weakness (generalized)  Other abnormalities of gait and mobility  Unsteadiness on feet  Rationale for Evaluation and Treatment: Rehabilitation  SUBJECTIVE:  SUBJECTIVE STATEMENT: Pt presents to this clinic seated in  his power wheelchair with B BKA and wearing B prosthetics. Pt with poor recall of when his BKA took place, per chart L BKA in Jan 2023 and R BKA in July 2023. Pt states that today is only his 3rd time wearing his prosthetics due to having a now-healed blister on his L residual limb. Pt also states that he is currently wearing a bandaid on his L residual limb due to cutting his limb on his wheelchair where his leg rests attach. Pt states that is goal is to work on being able to stand more and to work on walking with his prosthetics. Pt states that he has stood a few times at home with his RW but his RW is too short for him currently. Pt reports that he is currently transferring via lateral scooting or "walking around on my knees and then turning myself around once in my wheelchair". Pt also reports that sometimes when he stands up he gets dizzy, states this is due to vertigo and he is on medication for this.  Pt accompanied by: self  PERTINENT HISTORY:  PMH signficant for AAA aneurysm, anxiety, asthma, CAD, COPD, DM, DVT, HNT, migraine, morbid obesity, MI, PE, peripheral nerve disease, B BKA with prosthetics, CABG, bilateral shoulder open rotator cuff repair.  PAIN:  Are you having pain? No  PRECAUTIONS: Fall and Other: B BKA  WEIGHT BEARING RESTRICTIONS: No  FALLS: Has patient fallen in last 6 months? Yes. Number of falls 2-3; per pt was not injured during fall; per pt he can get back up by himself  LIVING ENVIRONMENT: Lives with: lives with their spouse Lives in: House/apartment Stairs: No Has following equipment at home: Environmental consultant - 2 wheeled, Wheelchair (power), Wheelchair (manual), and Ramped entry  PLOF: Independent with gait, Independent with transfers, and Requires assistive device for independence  PATIENT GOALS: "to walk"  OBJECTIVE:   DIAGNOSTIC FINDINGS:  None relevant to this POC  COGNITION: Overall cognitive status:  decreased memory per patient  report   SENSATION: Impaired due to neuropathy in residual limbs  POSTURE: rounded shoulders, forward head, and posterior pelvic tilt  LOWER EXTREMITY ROM:   *not assessed at eval  Active  Right Eval Left Eval  Hip flexion    Hip extension    Hip abduction    Hip adduction    Hip internal rotation    Hip external rotation    Knee flexion    Knee extension    Ankle dorsiflexion    Ankle plantarflexion    Ankle inversion    Ankle eversion     (Blank rows = not tested)  LOWER EXTREMITY MMT:  *not assessed at eval  MMT Right Eval Left Eval  Hip flexion    Hip extension    Hip abduction    Hip adduction    Hip internal rotation    Hip external rotation    Knee flexion    Knee extension    Ankle dorsiflexion    Ankle plantarflexion    Ankle inversion    Ankle eversion    (Blank rows = not tested)  BED MOBILITY:  Mod I per pt report  TRANSFERS: Assistive device utilized: Wheelchair (power)  Sit to stand:  not assessed at eval, mod I to RW per pt report Stand to sit:  not assessed at eval, mod I to RW per pt report Chair to chair:  not assessed at eval Floor:  not assessed  at eval   TODAY'S TREATMENT:                                                                                                                              Pt able to don and doff his B LE prosthetics independently. Upon skin inspection RLE residual limb with no issues; LLE has a scab on distal end of anterior part of residual limb as well as open skin/wound area further up the leg; encouraged pt to try and wear limb x 1 hour each day and then totally remove liner to let skin heal as well as to keep his skin clean and dry. Also noted that pt has an area of redness along bottom of L residual limb with some hardening of skin, will continue to monitor this area.   PATIENT EDUCATION: Education details: Eval findings, PT POC, importance of residual limb care (see above) Person educated:  Patient Education method: Explanation Education comprehension: verbalized understanding and needs further education  HOME EXERCISE PROGRAM: To be initiated  GOALS: Goals reviewed with patient? Yes  SHORT TERM GOALS: Target date: 06/25/2023  Pt will be independent with initial HEP for improved strength, balance, transfers and gait. Baseline: Goal status: INITIAL  2.  Transfers to be assessed and STG set Baseline:  Goal status: INITIAL  3.  Ambulation to be assessed as pt is safe and able and STG set Baseline:  Goal status: INITIAL  4.  Pt to demonstrate compliance with BLE prosthetic wear schedule and residual limb skin checks Baseline:  Goal status: INITIAL   LONG TERM GOALS: Target date: 07/23/2023  Pt will be independent with final HEP for improved strength, balance, transfers and gait. Baseline:  Goal status: INITIAL  2.  Transfers to be assessed and LTG set Baseline:  Goal status: INITIAL  3.  Ambulation to be assessed as pt is safe and able and LTG set Baseline:  Goal status: INITIAL   ASSESSMENT:  CLINICAL IMPRESSION: Patient is a 52 year old male referred to Neuro OPPT for weakness.   Pt's PMH is significant for: AAA aneurysm, anxiety, asthma, CAD, COPD, DM, DVT, HNT, migraine, morbid obesity, MI, PE, peripheral nerve disease, B BKA with prosthetics, CABG, bilateral shoulder open rotator cuff repair. The following deficits were present during the exam: decreased ability to stand and perform mobility safely and independently as well as care for residual limbs. Based on his history of frequent falls as well as B BKA, pt is an increased risk for falls. Pt would benefit from skilled PT to address these impairments and functional limitations to maximize functional mobility independence.   OBJECTIVE IMPAIRMENTS: decreased balance, decreased endurance, decreased knowledge of use of DME, decreased mobility, difficulty walking, prosthetic dependency , and obesity.    ACTIVITY LIMITATIONS: standing, squatting, and stairs  PARTICIPATION LIMITATIONS: driving and community activity  PERSONAL FACTORS: Fitness, Transportation, and 3+ comorbidities:    AAA aneurysm, anxiety, asthma, CAD, COPD, DM, DVT, HNT,  migraine, morbid obesity, MI, PE, peripheral nerve disease, B BKA with prosthetics, CABG, bilateral shoulder open rotator cuff repair.are also affecting patient's functional outcome.   REHAB POTENTIAL: Good  CLINICAL DECISION MAKING: Stable/uncomplicated  EVALUATION COMPLEXITY: Moderate  PLAN:  PT FREQUENCY: 2x/week  PT DURATION: 8 weeks  PLANNED INTERVENTIONS: Therapeutic exercises, Therapeutic activity, Neuromuscular re-education, Balance training, Gait training, Patient/Family education, Self Care, Prosthetic training, DME instructions, Wheelchair mobility training, scar mobilization, Manual therapy, and Re-evaluation  PLAN FOR NEXT SESSION: assess sit to stand in // bars vs with RW, assess transfers w/c to/from mat table (lateral scoot vs A/P transfer), assess gait with RW once safe and able, amputee edu (wear schedule, residual limb care, etc.), also pt unsure which liner goes on which limb so needs to figure this out then label them (works with Nadine Counts at [employer]), how is small wound on L residual limb healing?   Peter Congo, PT, DPT, CSRS 05/28/2023, 6:00 PM

## 2023-05-31 ENCOUNTER — Ambulatory Visit: Payer: Medicare HMO | Admitting: Physical Therapy

## 2023-06-03 ENCOUNTER — Ambulatory Visit: Payer: Medicare HMO | Admitting: Physical Therapy

## 2023-06-03 VITALS — BP 107/72 | HR 78

## 2023-06-03 DIAGNOSIS — M6281 Muscle weakness (generalized): Secondary | ICD-10-CM

## 2023-06-03 DIAGNOSIS — R2689 Other abnormalities of gait and mobility: Secondary | ICD-10-CM

## 2023-06-03 DIAGNOSIS — R2681 Unsteadiness on feet: Secondary | ICD-10-CM

## 2023-06-03 NOTE — Therapy (Signed)
OUTPATIENT PHYSICAL THERAPY NEURO TREATMENT   Patient Name: Joseph [name] MRN: [id number] DOB:1971-04-25, 52 y.o., male Today's Date: 06/03/2023   PCP: Lucile Crater FNP-BC ([facility]) REFERRING PROVIDER: Carney Living, MD  END OF SESSION:  PT End of Session - 06/03/23 1343     Visit Number 2    Number of Visits 17   with eval   Date for PT Re-Evaluation 08/19/23    Authorization Type [employer]    Progress Note Due on Visit 10    PT Start Time 1340    PT Stop Time 1430    PT Time Calculation (min) 50 min    Equipment Utilized During Treatment Gait belt;Other (comment)   Bilateral BKA prosthetics   Activity Tolerance Patient tolerated treatment well    Behavior During Therapy WFL for tasks assessed/performed              Past Medical History:  Diagnosis Date   Aneurysm of ascending aorta (HCC) 09/14/2021   Anxiety    Arthritis    "knees, shoulders, hips, ankles" (07/29/2016)   Asthma    Bicuspid aortic valve 09/14/2021   CAD (coronary artery disease)    a. 2017: s/p BMS to distal Cx; b. LHC 05/30/2019: 80% mid, distal RCA s/p DES, 30% narrowing of d LM, widely patent LAD w/ luminal irregularities, widely patent stent in dCX  w/ 90+% stenosis distal to stent beofre small trifurcating obtuse marginal (potentially area of restenosis)   Chronic bronchitis (HCC)    Chronic ulcer of great toe of right foot (HCC) 09/09/2017   Chronic ulcer of right great toe (HCC) 09/19/2017   COPD (chronic obstructive pulmonary disease) (HCC)    Coronary arteriosclerosis    Depression    Diabetes mellitus (HCC)    DVT (deep venous thrombosis) (HCC)    GERD (gastroesophageal reflux disease)    Hyperlipidemia    Hypertension    Migraine    Morbid obesity (HCC)    Myocardial infarction (HCC) 1996   "light one"   PE (pulmonary embolism) 04/2013   On chronic Xarelto   Peripheral nerve disease    Pneumonia "several times"   Sleep apnea    Type II diabetes mellitus (HCC)     Past Surgical History:  Procedure Laterality Date   ABDOMINAL AORTOGRAM W/LOWER EXTREMITY N/A 10/10/2020   Procedure: ABDOMINAL AORTOGRAM W/LOWER EXTREMITY;  Surgeon: Sherren Kerns, MD;  Location: [facility];  Service: Cardiovascular;  Laterality: N/A;   AMPUTATION Right 09/21/2017   Procedure: RIGHT GREAT TOE AMPUTATION, POSSIBLE VAC;  Surgeon: Tarry Kos, MD;  Location: [facility] OR;  Service: Orthopedics;  Laterality: Right;   AMPUTATION Left 08/13/2020   Procedure: LEFT FOOT 4TH RAY AMPUTATION;  Surgeon: [name], MD;  Location: [facility] OR;  Service: Orthopedics;  Laterality: Left;   AMPUTATION Left 11/20/2021   Procedure: LEFT TRANSMETATARSAL AMPUTATION;  Surgeon: [name], MD;  Location: [facility] OR;  Service: Orthopedics;  Laterality: Left;   AMPUTATION Left 01/08/2022   Procedure: LEFT BELOW KNEE AMPUTATION;  Surgeon: [name], MD;  Location: [facility] OR;  Service: Orthopedics;  Laterality: Left;   AMPUTATION Right 07/07/2022   Procedure: RIGHT BELOW KNEE AMPUTATION;  Surgeon: [name], MD;  Location: [facility] OR;  Service: Orthopedics;  Laterality: Right;   AORTOGRAM Bilateral 03/13/2021   Procedure: ABDOMINAL AORTOGRAM WITH Left LOWER EXTREMITY RUNOFF;  Surgeon: Leonie Douglas, MD;  Location: [facility] OR;  Service: Vascular;  Laterality: Bilateral;  APPLICATION OF WOUND VAC  01/08/2022   Procedure: APPLICATION OF WOUND VAC;  Surgeon: [name], MD;  Location: [facility] OR;  Service: Orthopedics;;   APPLICATION OF WOUND VAC Right 07/07/2022   Procedure: APPLICATION OF WOUND VAC;  Surgeon: [name], MD;  Location: [facility] OR;  Service: Orthopedics;  Laterality: Right;   CARDIAC CATHETERIZATION  2006   CARDIAC CATHETERIZATION  1996   "@ [facility]; when I had my heart attack"   CARDIAC CATHETERIZATION N/A 07/29/2016   Procedure: Left Heart Cath and Coronary Angiography;  Surgeon: Runell Gess, MD;  Location: [facility] INVASIVE CV LAB;  Service: Cardiovascular;  Laterality: N/A;   CARDIAC  CATHETERIZATION N/A 07/29/2016   Procedure: Coronary Stent Intervention;  Surgeon: Runell Gess, MD;  Location: [facility];  Service: Cardiovascular;  Laterality: N/A;   CARPAL TUNNEL RELEASE Bilateral    CORONARY ANGIOPLASTY WITH STENT PLACEMENT  07/29/2016   CORONARY ARTERY BYPASS GRAFT N/A 07/24/2020   Procedure: CORONARY ARTERY BYPASS GRAFTING (CABG), ON PUMP, TIMES FOUR, USING LEFT INTERNAL MAMMARY ARTERY AND ENDOSCOPICALLY HARVESTED RIGHT GREATER SAPHENOUS VEIN;  Surgeon: Corliss Skains, MD;  Location: [facility] OR;  Service: Open Heart Surgery;  Laterality: N/A;  FLOW TAC   CORONARY PRESSURE/FFR STUDY N/A 07/22/2020   Procedure: INTRAVASCULAR PRESSURE WIRE/FFR STUDY;  Surgeon: Marykay Lex, MD;  Location: [facility] INVASIVE CV LAB;  Service: Cardiovascular;  Laterality: N/A;   CORONARY STENT INTERVENTION N/A 05/30/2019   Procedure: CORONARY STENT INTERVENTION;  Surgeon: Lyn Records, MD;  Location: [facility];  Service: Cardiovascular;  Laterality: N/A;   CORONARY STENT INTERVENTION N/A 11/02/2019   Procedure: CORONARY STENT INTERVENTION;  Surgeon: Iran Ouch, MD;  Location: [facility];  Service: Cardiovascular;  Laterality: N/A;   ESOPHAGOGASTRODUODENOSCOPY N/A 09/22/2017   Procedure: ESOPHAGOGASTRODUODENOSCOPY (EGD);  Surgeon: Meryl Dare, MD;  Location: [facility] ENDOSCOPY;  Service: Endoscopy;  Laterality: N/A;   KNEE ARTHROSCOPY Bilateral    "2 on left; 1 on the right"   LEFT HEART CATH AND CORONARY ANGIOGRAPHY N/A 11/02/2019   Procedure: LEFT HEART CATH AND CORONARY ANGIOGRAPHY;  Surgeon: Iran Ouch, MD;  Location: [facility];  Service: Cardiovascular;  Laterality: N/A;   LEFT HEART CATH AND CORONARY ANGIOGRAPHY N/A 07/22/2020   Procedure: LEFT HEART CATH AND CORONARY ANGIOGRAPHY;  Surgeon: Marykay Lex, MD;  Location: [facility] INVASIVE CV LAB;  Service: Cardiovascular;  Laterality: N/A;   LEFT HEART CATH AND CORS/GRAFTS ANGIOGRAPHY N/A 08/17/2021    Procedure: LEFT HEART CATH AND CORS/GRAFTS ANGIOGRAPHY;  Surgeon: Yvonne Kendall, MD;  Location: [facility];  Service: Cardiovascular;  Laterality: N/A;   PERIPHERAL VASCULAR INTERVENTION Left 10/11/2020   popliteal and SFA stent placement    PERIPHERAL VASCULAR INTERVENTION Left 10/10/2020   Procedure: PERIPHERAL VASCULAR INTERVENTION;  Surgeon: Sherren Kerns, MD;  Location: [facility];  Service: Cardiovascular;  Laterality: Left;   RIGHT/LEFT HEART CATH AND CORONARY ANGIOGRAPHY N/A 05/30/2019   Procedure: RIGHT/LEFT HEART CATH AND CORONARY ANGIOGRAPHY;  Surgeon: Lyn Records, MD;  Location: [facility];  Service: Cardiovascular;  Laterality: N/A;   RIGHT/LEFT HEART CATH AND CORONARY/GRAFT ANGIOGRAPHY N/A 06/17/2022   Procedure: RIGHT/LEFT HEART CATH AND CORONARY/GRAFT ANGIOGRAPHY;  Surgeon: Lennette Bihari, MD;  Location: [facility];  Service: Cardiovascular;  Laterality: N/A;   SHOULDER OPEN ROTATOR CUFF REPAIR Bilateral    TEE WITHOUT CARDIOVERSION N/A 07/24/2020   Procedure: TRANSESOPHAGEAL ECHOCARDIOGRAM (TEE);  Surgeon: Corliss Skains, MD;  Location: [facility] OR;  Service: Open Heart Surgery;  Laterality: N/A;   Patient Active Problem List   Diagnosis Date Noted   AKI (acute kidney injury) (HCC) 04/29/2023   Weakness 04/28/2023   Nephrosis 04/27/2023   CHF (congestive heart failure) (HCC) 04/26/2023   OSA (obstructive sleep apnea) 04/26/2023   Preoperative cardiovascular examination 04/25/2023   Hx of iron deficiency anemia 07/07/2022   Syncope and collapse 06/15/2022   AMS (altered mental status) 04/14/2022   Acute respiratory failure with hypoxia (HCC) 04/14/2022   Acute metabolic encephalopathy 04/14/2022   Pneumonia 04/14/2022   Ulcer of right foot, limited to breakdown of skin (HCC)    Below-knee amputation of left lower extremity (HCC)    Osteomyelitis of foot, right, acute (HCC) 01/06/2022   Bipolar disorder with depression (HCC)     Sepsis (HCC) 11/18/2021   Chest pain 11/18/2021   Dyspnea 11/18/2021   Wound infection    Ulcer of left foot with necrosis of bone (HCC)    Bicuspid aortic valve 09/14/2021   Aneurysm of ascending aorta (HCC) 09/14/2021   DM (diabetes mellitus) with peripheral vascular complication (HCC) 08/20/2021   Atypical chest pain 07/17/2021   Hyponatremia 07/17/2021   Leg wound, left 07/17/2021   Essential hypertension 07/17/2021   Acute hyperglycemia 07/09/2021   Non-ST elevation (NSTEMI) myocardial infarction (HCC) 07/22/2020   Heart failure with preserved ejection fraction (HCC) 07/22/2020   Hypotension 07/18/2020   Elevated troponin 07/18/2020   Acute renal failure superimposed on stage 3a chronic kidney disease (HCC) 07/18/2020   Elevated brain natriuretic peptide (BNP) level 07/18/2020   Edema 06/13/2019   Leukocytosis 05/31/2019   Smoker 03/08/2019   Chronic back pain 02/16/2019   Deep venous thrombosis (HCC) 02/16/2019   Obesity, Class III, BMI 40-49.9 (morbid obesity) (HCC) 02/16/2019   Peripheral nerve disease 02/16/2019   COPD (chronic obstructive pulmonary disease) (HCC) 01/22/2019   Anxiety and depression 03/26/2018   Sinusitis 10/12/2017   Abdominal pain    Loss of weight    Diabetic foot infection (HCC) 09/16/2017   Cellulitis of right foot    Recurrent chest pain 07/29/2016   Hyperglycemia    Insulin dependent type 2 diabetes mellitus (HCC) 04/29/2015   History of pulmonary embolus (PE) 04/27/2013   CAD (coronary artery disease) 09/10/2008    ONSET DATE: 04/27/2023  REFERRING DIAG: R53.1 (ICD-10-CM) - Weakness  THERAPY DIAG:  Muscle weakness (generalized)  Other abnormalities of gait and mobility  Unsteadiness on feet  Rationale for Evaluation and Treatment: Rehabilitation  SUBJECTIVE:  SUBJECTIVE STATEMENT: Pt reports he is in 7/10 pain today, mostly in BUEs and back. Denies acute changes since last visit. Saw [facility] yesterday and was told to wear more socks as his socket is too large.   Pt accompanied by: self  PERTINENT HISTORY:  PMH signficant for AAA aneurysm, anxiety, asthma, CAD, COPD, DM, DVT, HNT, migraine, morbid obesity, MI, PE, peripheral nerve disease, B BKA with prosthetics, CABG, bilateral shoulder open rotator cuff repair.  PAIN:  Are you having pain? Yes: NPRS scale: 7/10 Pain location: Bilateral shoulders, low back, total body Pain description: achy  PRECAUTIONS: Fall and Other: B BKA  WEIGHT BEARING RESTRICTIONS: No  FALLS: Has patient fallen in last 6 months? Yes. Number of falls 2-3; per pt was not injured during fall; per pt he can get back up by himself  LIVING ENVIRONMENT: Lives with: lives with their spouse Lives in: House/apartment Stairs: No Has following equipment at home: Environmental consultant - 2 wheeled, Wheelchair (power), Wheelchair (manual), and Ramped entry  PLOF: Independent with gait, Independent with transfers, and Requires assistive device for independence  PATIENT GOALS: "to walk"  OBJECTIVE:   DIAGNOSTIC FINDINGS:  None relevant to this POC  COGNITION: Overall cognitive status:  decreased memory per patient report   SENSATION: Impaired due to neuropathy in residual limbs  POSTURE: rounded shoulders, forward head, and posterior pelvic tilt  LOWER EXTREMITY ROM:   *not assessed at eval  Active  Right Eval Left Eval  Hip flexion    Hip extension    Hip abduction    Hip adduction    Hip internal rotation    Hip external rotation    Knee flexion    Knee extension    Ankle dorsiflexion    Ankle plantarflexion    Ankle inversion    Ankle eversion     (Blank rows = not tested)  LOWER EXTREMITY MMT:  *not assessed at eval  MMT Right Eval Left Eval  Hip flexion    Hip extension    Hip abduction    Hip  adduction    Hip internal rotation    Hip external rotation    Knee flexion    Knee extension    Ankle dorsiflexion    Ankle plantarflexion    Ankle inversion    Ankle eversion    (Blank rows = not tested)  VITALS  Vitals:   06/03/23 1359  BP: 107/72  Pulse: 78     TODAY'S TREATMENT:       Ther Act/NMR   Gait for 115'  L prosthetic too large  Added 10 ply socks - still IR and valgus      PATIENT EDUCATION: Education details: Eval findings, PT POC, importance of residual limb care (see above) Person educated: Patient Education method: Explanation Education comprehension: verbalized understanding and needs further education  HOME EXERCISE PROGRAM: To be initiated  GOALS: Goals reviewed with patient? Yes  SHORT TERM GOALS: Target date: 06/25/2023  Pt will be independent with initial HEP for improved strength, balance, transfers and gait. Baseline: Goal status: INITIAL  2.  Transfers to be assessed and STG set Baseline:  Goal status: INITIAL  3.  Ambulation to be assessed as pt is safe and able and STG set Baseline:  Goal status: INITIAL  4.  Pt to demonstrate compliance with BLE prosthetic wear schedule and residual limb skin checks Baseline:  Goal status: INITIAL   LONG TERM GOALS: Target date: 07/23/2023  Pt will be independent with final HEP  for improved strength, balance, transfers and gait. Baseline:  Goal status: INITIAL  2.  Transfers to be assessed and LTG set Baseline:  Goal status: INITIAL  3.  Ambulation to be assessed as pt is safe and able and LTG set Baseline:  Goal status: INITIAL   ASSESSMENT:  CLINICAL IMPRESSION: Patient is a 52 year old male referred to Neuro OPPT for weakness.   Pt's PMH is significant for: AAA aneurysm, anxiety, asthma, CAD, COPD, DM, DVT, HNT, migraine, morbid obesity, MI, PE, peripheral nerve disease, B BKA with prosthetics, CABG, bilateral shoulder open rotator cuff repair. The following deficits were  present during the exam: decreased ability to stand and perform mobility safely and independently as well as care for residual limbs. Based on his history of frequent falls as well as B BKA, pt is an increased risk for falls. Pt would benefit from skilled PT to address these impairments and functional limitations to maximize functional mobility independence.   OBJECTIVE IMPAIRMENTS: decreased balance, decreased endurance, decreased knowledge of use of DME, decreased mobility, difficulty walking, prosthetic dependency , and obesity.   ACTIVITY LIMITATIONS: standing, squatting, and stairs  PARTICIPATION LIMITATIONS: driving and community activity  PERSONAL FACTORS: Fitness, Transportation, and 3+ comorbidities:    AAA aneurysm, anxiety, asthma, CAD, COPD, DM, DVT, HNT, migraine, morbid obesity, MI, PE, peripheral nerve disease, B BKA with prosthetics, CABG, bilateral shoulder open rotator cuff repair.are also affecting patient's functional outcome.   REHAB POTENTIAL: Good  CLINICAL DECISION MAKING: Stable/uncomplicated  EVALUATION COMPLEXITY: Moderate  PLAN:  PT FREQUENCY: 2x/week  PT DURATION: 8 weeks  PLANNED INTERVENTIONS: Therapeutic exercises, Therapeutic activity, Neuromuscular re-education, Balance training, Gait training, Patient/Family education, Self Care, Prosthetic training, DME instructions, Wheelchair mobility training, scar mobilization, Manual therapy, and Re-evaluation  PLAN FOR NEXT SESSION: assess sit to stand in // bars vs with RW, assess transfers w/c to/from mat table (lateral scoot vs A/P transfer), assess gait with RW once safe and able, amputee edu (wear schedule, residual limb care, etc.), also pt unsure which liner goes on which limb so needs to figure this out then label them (works with Nadine Counts at [employer]), how is small wound on L residual limb healing?   Jill Alexanders Trey Gulbranson, PT, DPT  06/03/2023, 2:41 PM

## 2023-06-06 ENCOUNTER — Ambulatory Visit: Payer: Medicare HMO | Admitting: Physical Therapy

## 2023-06-06 NOTE — Progress Notes (Deleted)
Office Visit    Patient Name: Joseph [name] Date of Encounter: 06/06/2023  Primary Care Provider:  Marva Panda, NP Primary Cardiologist:  Donato Schultz, MD Primary Electrophysiologist: None   Past Medical History    Past Medical History:  Diagnosis Date   Aneurysm of ascending aorta (HCC) 09/14/2021   Anxiety    Arthritis    "knees, shoulders, hips, ankles" (07/29/2016)   Asthma    Bicuspid aortic valve 09/14/2021   CAD (coronary artery disease)    a. 2017: s/p BMS to distal Cx; b. LHC 05/30/2019: 80% mid, distal RCA s/p DES, 30% narrowing of d LM, widely patent LAD w/ luminal irregularities, widely patent stent in dCX  w/ 90+% stenosis distal to stent beofre small trifurcating obtuse marginal (potentially area of restenosis)   Chronic bronchitis (HCC)    Chronic ulcer of great toe of right foot (HCC) 09/09/2017   Chronic ulcer of right great toe (HCC) 09/19/2017   COPD (chronic obstructive pulmonary disease) (HCC)    Coronary arteriosclerosis    Depression    Diabetes mellitus (HCC)    DVT (deep venous thrombosis) (HCC)    GERD (gastroesophageal reflux disease)    Hyperlipidemia    Hypertension    Migraine    Morbid obesity (HCC)    Myocardial infarction (HCC) 1996   "light one"   PE (pulmonary embolism) 04/2013   On chronic Xarelto   Peripheral nerve disease    Pneumonia "several times"   Sleep apnea    Type II diabetes mellitus (HCC)    Past Surgical History:  Procedure Laterality Date   ABDOMINAL AORTOGRAM W/LOWER EXTREMITY N/A 10/10/2020   Procedure: ABDOMINAL AORTOGRAM W/LOWER EXTREMITY;  Surgeon: Sherren Kerns, MD;  [facility]: [facility];  Service: Cardiovascular;  Laterality: N/A;   AMPUTATION Right 09/21/2017   Procedure: RIGHT GREAT TOE AMPUTATION, POSSIBLE VAC;  Surgeon: Tarry Kos, MD;  Location: [facility] OR;  Service: Orthopedics;  Laterality: Right;   AMPUTATION Left 08/13/2020   Procedure: LEFT FOOT 4TH RAY AMPUTATION;  Surgeon: [name], MD;  Location: [facility] OR;  Service: Orthopedics;  Laterality: Left;   AMPUTATION Left 11/20/2021   Procedure: LEFT TRANSMETATARSAL AMPUTATION;  Surgeon: [name], MD;  Location: [facility] OR;  Service: Orthopedics;  Laterality: Left;   AMPUTATION Left 01/08/2022   Procedure: LEFT BELOW KNEE AMPUTATION;  Surgeon: [name], MD;  Location: [facility] OR;  Service: Orthopedics;  Laterality: Left;   AMPUTATION Right 07/07/2022   Procedure: RIGHT BELOW KNEE AMPUTATION;  Surgeon: [name], MD;  Location: [facility] OR;  Service: Orthopedics;  Laterality: Right;   AORTOGRAM Bilateral 03/13/2021   Procedure: ABDOMINAL AORTOGRAM WITH Left LOWER EXTREMITY RUNOFF;  Surgeon: Leonie Douglas, MD;  Location: [facility] OR;  Service: Vascular;  Laterality: Bilateral;   APPLICATION OF WOUND VAC  01/08/2022   Procedure: APPLICATION OF WOUND VAC;  Surgeon: [name], MD;  Location: [facility] OR;  Service: Orthopedics;;   APPLICATION OF WOUND VAC Right 07/07/2022   Procedure: APPLICATION OF WOUND VAC;  Surgeon: [name], MD;  Location: [facility] OR;  Service: Orthopedics;  Laterality: Right;   CARDIAC CATHETERIZATION  2006   CARDIAC CATHETERIZATION  1996   "@ [facility]; when I had my heart attack"   CARDIAC CATHETERIZATION N/A 07/29/2016   Procedure: Left Heart Cath and Coronary Angiography;  Surgeon: Runell Gess, MD;  Location: [facility] INVASIVE CV LAB;  Service: Cardiovascular;  Laterality: N/A;   CARDIAC  CATHETERIZATION N/A 07/29/2016   Procedure: Coronary Stent Intervention;  Surgeon: Runell Gess, MD;  Location: [facility] INVASIVE CV LAB;  Service: Cardiovascular;  Laterality: N/A;   CARPAL TUNNEL RELEASE Bilateral    CORONARY ANGIOPLASTY WITH STENT PLACEMENT  07/29/2016   CORONARY ARTERY BYPASS GRAFT N/A 07/24/2020   Procedure: CORONARY ARTERY BYPASS GRAFTING (CABG), ON PUMP, TIMES FOUR, USING LEFT INTERNAL MAMMARY ARTERY AND ENDOSCOPICALLY HARVESTED RIGHT GREATER SAPHENOUS VEIN;  Surgeon: Corliss Skains, MD;  Location: [facility] OR;   Service: Open Heart Surgery;  Laterality: N/A;  FLOW TAC   CORONARY PRESSURE/FFR STUDY N/A 07/22/2020   Procedure: INTRAVASCULAR PRESSURE WIRE/FFR STUDY;  Surgeon: Marykay Lex, MD;  Location: [facility] INVASIVE CV LAB;  Service: Cardiovascular;  Laterality: N/A;   CORONARY STENT INTERVENTION N/A 05/30/2019   Procedure: CORONARY STENT INTERVENTION;  Surgeon: Lyn Records, MD;  Location: [facility];  Service: Cardiovascular;  Laterality: N/A;   CORONARY STENT INTERVENTION N/A 11/02/2019   Procedure: CORONARY STENT INTERVENTION;  Surgeon: Iran Ouch, MD;  [facility]: [facility];  Service: Cardiovascular;  Laterality: N/A;   ESOPHAGOGASTRODUODENOSCOPY N/A 09/22/2017   Procedure: ESOPHAGOGASTRODUODENOSCOPY (EGD);  Surgeon: Meryl Dare, MD;  Location: [facility] ENDOSCOPY;  Service: Endoscopy;  Laterality: N/A;   KNEE ARTHROSCOPY Bilateral    "2 on left; 1 on the right"   LEFT HEART CATH AND CORONARY ANGIOGRAPHY N/A 11/02/2019   Procedure: LEFT HEART CATH AND CORONARY ANGIOGRAPHY;  Surgeon: Iran Ouch, MD;  [facility]: [facility];  Service: Cardiovascular;  Laterality: N/A;   LEFT HEART CATH AND CORONARY ANGIOGRAPHY N/A 07/22/2020   Procedure: LEFT HEART CATH AND CORONARY ANGIOGRAPHY;  Surgeon: Marykay Lex, MD;  Location: [facility] INVASIVE CV LAB;  Service: Cardiovascular;  Laterality: N/A;   LEFT HEART CATH AND CORS/GRAFTS ANGIOGRAPHY N/A 08/17/2021   Procedure: LEFT HEART CATH AND CORS/GRAFTS ANGIOGRAPHY;  Surgeon: Yvonne Kendall, MD;  [facility]: [facility];  Service: Cardiovascular;  Laterality: N/A;   PERIPHERAL VASCULAR INTERVENTION Left 10/11/2020   popliteal and SFA stent placement    PERIPHERAL VASCULAR INTERVENTION Left 10/10/2020   Procedure: PERIPHERAL VASCULAR INTERVENTION;  Surgeon: Sherren Kerns, MD;  [facility]: [facility];  Service: Cardiovascular;  Laterality: Left;   RIGHT/LEFT HEART CATH AND CORONARY ANGIOGRAPHY N/A 05/30/2019   Procedure:  RIGHT/LEFT HEART CATH AND CORONARY ANGIOGRAPHY;  Surgeon: Lyn Records, MD;  [facility]: [facility];  Service: Cardiovascular;  Laterality: N/A;   RIGHT/LEFT HEART CATH AND CORONARY/GRAFT ANGIOGRAPHY N/A 06/17/2022   Procedure: RIGHT/LEFT HEART CATH AND CORONARY/GRAFT ANGIOGRAPHY;  Surgeon: Lennette Bihari, MD;  [facility]: [facility];  Service: Cardiovascular;  Laterality: N/A;   SHOULDER OPEN ROTATOR CUFF REPAIR Bilateral    TEE WITHOUT CARDIOVERSION N/A 07/24/2020   Procedure: TRANSESOPHAGEAL ECHOCARDIOGRAM (TEE);  Surgeon: Corliss Skains, MD;  Location: [facility] OR;  Service: Open Heart Surgery;  Laterality: N/A;    Allergies  Allergies  Allergen Reactions   Sulfa Antibiotics Anaphylaxis and Other (See Comments)    Headaches      History of Present Illness   Joseph [name] is a 52 y.o. male with PMH of hx of CAD s/p dLCx and s/p CABG in 2021(LIMA-->LAD, SVG-->PDA, OM1, D1), PAD with stents to the R extremity with prior R and L toe amputations, chronic ulcer of both feet great toe, COPD with significant prior smoking history, HTN, HLD, ischemic cardiomyopathy, DM2 with diabetes neuropathy, prior hx of morbid Obesity,  OSA unclear if on CPAP, hx of DVT and PE on Xarelto who presents today for follow-up.  He was initially seen by Dr. Tenny Craw in 2011 for complaint of chest pain.  He underwent Myoview in 2011 that was normal.  Patient had pulmonary embolism in 2014 and is currently on chronic Xarelto.  In 2017 patient had complaint of chest pain and underwent LHC which showed 80% lesion in distal circumflex that was treated with BMS.  2D echo was also completed with EF of 55-60% with LVH.   Patient underwent CABG on 07/2020 (LIMA-->LAD, SVG-->PDA, OM1, D1).  He had 2 stents placed to popliteal and left SFA with partial amputation of his foot.  He underwent left below-knee amputation in 12/2021.   He was seen by Tereso Newcomer, PA on 04/25/2023 reported worsening chest discomfort for several  weeks.  He was found to have shortness of breath and upper and lower extremity edema.  He was granted clearance for surgery due to nature of low risk.  He was switched back to furosemide due to increased swelling with torsemide.  He was admitted 05/01/2023 for exacerbation of CHF.  He was given Lasix with his significant edema was seen by nephrology for possible nephrotic syndrome.  Creatinine was 2.54 and patient had complaint of chest pain with reassuring troponins completed.  He was discharged with plan for diuresis and nephrology follow-up.  He was continued on home Ranexa and torsemide 60 mg daily.    Since last being seen in the office patient reports***.  Patient denies chest pain, palpitations, dyspnea, PND, orthopnea, nausea, vomiting, dizziness, syncope, edema, weight gain, or early satiety.     ***Notes: -Increase Ranexa if chest pain not improved Home Medications    Current Outpatient Medications  Medication Sig Dispense Refill   acetaminophen (TYLENOL) 500 MG tablet Take 1,000 mg by mouth 3 (three) times daily as needed for headache.     albuterol (VENTOLIN HFA) 108 (90 Base) MCG/ACT inhaler Inhale 2 puffs into the lungs every 6 (six) hours as needed for shortness of breath.     ALPRAZolam (XANAX) 1 MG tablet Take 2 mg by mouth at bedtime.     aspirin EC 81 MG tablet Take 81 mg by mouth in the morning. Swallow whole.     atorvastatin (LIPITOR) 80 MG tablet Take 1 tablet (80 mg total) by mouth daily at 6 PM. 30 tablet 2   buPROPion (WELLBUTRIN XL) 150 MG 24 hr tablet Take 150 mg by mouth in the morning and at bedtime.     carvedilol (COREG) 6.25 MG tablet Take 1 tablet (6.25 mg total) by mouth 2 (two) times daily. 60 tablet 0   clopidogrel (PLAVIX) 75 MG tablet Take 1 tablet (75 mg total) by mouth daily with breakfast. 30 tablet 3   Continuous Blood Gluc Sensor (FREESTYLE LIBRE 14 DAY SENSOR) MISC Apply topically 4 (four) times daily.     dapagliflozin propanediol (FARXIGA) 10 MG  TABS tablet Take 1 tablet (10 mg total) by mouth daily. 30 tablet 0   escitalopram (LEXAPRO) 10 MG tablet Take 10 mg by mouth daily.     fluconazole (DIFLUCAN) 100 MG tablet Take 1 tablet by mouth once a week.     fluticasone (FLONASE) 50 MCG/ACT nasal spray Place 2 sprays into both nostrils daily.     icosapent Ethyl (VASCEPA) 1 g capsule Take 2 capsules (2 g total) by mouth 2 (two) times daily. 360 capsule 3   insulin aspart (NOVOLOG) 100 UNIT/ML  injection Inject 3 Units into the skin 3 (three) times daily with meals. 10 mL 11   insulin glargine (LANTUS SOLOSTAR) 100 UNIT/ML Solostar Pen Inject 35 Units into the skin 2 (two) times daily. 15 mL 0   Insulin Pen Needle 32G X 4 MM MISC Use as directed with insulin pens 100 each 0   loratadine (CLARITIN) 10 MG tablet Take 10 mg by mouth daily.     metFORMIN (GLUCOPHAGE) 1000 MG tablet Take 1 tablet (1,000 mg total) by mouth 2 (two) times daily with a meal.     naloxone (NARCAN) nasal spray 4 mg/0.1 mL Place 4 mg into the nose as needed (overdose).     nitroGLYCERIN (NITROSTAT) 0.4 MG SL tablet Place 1 tablet (0.4 mg total) under the tongue every 5 (five) minutes as needed. (Patient taking differently: Place 0.4 mg under the tongue every 5 (five) minutes as needed for chest pain.) 25 tablet 3   oxyCODONE (ROXICODONE) 15 MG immediate release tablet Take 1 tablet (15 mg total) by mouth every 4 (four) hours as needed for moderate pain (pain score 4-6). 30 tablet 0   pantoprazole (PROTONIX) 40 MG tablet Take 1 tablet (40 mg total) by mouth daily. 30 tablet 0   pregabalin (LYRICA) 150 MG capsule Take 150 mg by mouth 3 (three) times daily.     promethazine (PHENERGAN) 25 MG tablet Take 25 mg by mouth every 4 (four) hours as needed for nausea or vomiting.     ranolazine (RANEXA) 500 MG 12 hr tablet TAKE 1 TABLET(500 MG) BY MOUTH TWICE DAILY (Patient taking differently: Take 500 mg by mouth 2 (two) times daily.) 180 tablet 3   torsemide (DEMADEX) 20 MG tablet  Take 3 tablets (60 mg total) by mouth daily. 90 tablet 0   Ubrogepant (UBRELVY) 100 MG TABS Take 1 tablet by mouth daily as needed (For migraine).     No current facility-administered medications for this visit.     Review of Systems  Please see the history of present illness.    (+)*** (+)***  All other systems reviewed and are otherwise negative except as noted above.  Physical Exam    Wt Readings from Last 3 Encounters:  05/03/23 (!) 391 lb 8.6 oz (177.6 kg)  04/25/23 (!) 375 lb (170.1 kg)  01/13/23 (!) 400 lb (181.4 kg)   ZO:XWRUE were no vitals filed for this visit.,There is no height or weight on file to calculate BMI.  Constitutional:      Appearance: Healthy appearance. Not in distress.  Neck:     Vascular: JVD normal.  Pulmonary:     Effort: Pulmonary effort is normal.     Breath sounds: No wheezing. No rales. Diminished in the bases Cardiovascular:     Normal rate. Regular rhythm. Normal S1. Normal S2.      Murmurs: There is no murmur.  Edema:    Peripheral edema absent.  Abdominal:     Palpations: Abdomen is soft non tender. There is no hepatomegaly.  Skin:    General: Skin is warm and dry.  Neurological:     General: No focal deficit present.     Mental Status: Alert and oriented to person, place and time.     Cranial Nerves: Cranial nerves are intact.  EKG/LABS/ Recent Cardiac Studies    ECG personally reviewed by me today - ***  Cardiac Studies & Procedures   CARDIAC CATHETERIZATION  CARDIAC CATHETERIZATION 06/17/2022  Narrative   Mid LM to [facility] LAD  lesion is 80% stenosed with 40% stenosed side branch in Ost Cx to Prox Cx.   Mid Cx-2 lesion is 5% stenosed.   Dist Cx lesion is 70% stenosed.   Mid Cx-1 lesion is 100% stenosed.   Prox RCA to Mid RCA lesion is 30% stenosed.   Dist RCA lesion is 50% stenosed with 50% stenosed side branch in RPDA.   Prox RCA lesion is 80% stenosed.   Prox Graft to Mid Graft lesion is 30% stenosed.   1st Diag lesion is  40% stenosed.   3rd Diag lesion is 60% stenosed.   Mid LAD lesion is 50% stenosed.   SVG and is large.   SVG and is normal in caliber.   SVG and is large.   The graft exhibits no disease.   The graft exhibits no disease.  Severe native CAD with 80% distal left main stenosis with widely patent LIMA graft supplying the LAD and SVG supplying the diagonal vessel.  The native circumflex is totally occluded with a widely patent vein graft supplying the distal OM vessel.  The native dominant RCA has 80% focal proximal stenosis with mild in-stent restenosis in the previously placed mid stent with 50% narrowing at the ostium of the PDA with widely patent SVG to PDA.  Normal right heart pressures with mean PA pressure at 21 mmHg/  RECOMMENDATION: Medical therapy.  Continue long-term DAPT.  Optimal blood pressure control with target blood pressure less than 130/80.  Aggressive lipid-lowering therapy with target LDL less than 55.  Findings Coronary Findings Diagnostic  Dominance: Right  Left Main Vessel is large. Mid LM to Ost LAD lesion is 80% stenosed with 40% stenosed side branch in Ost Cx to Prox Cx. The lesion is concentric. The lesion is mildly calcified. Ostial and proximal LCx disease is mildly calcified, eccentric and irregular  Left Anterior Descending There is mild diffuse disease throughout the vessel. Mid LAD lesion is 50% stenosed.  First Diagonal Branch Vessel is small in size. 1st Diag lesion is 40% stenosed.  Third Diagonal Branch Vessel is small in size. 3rd Diag lesion is 60% stenosed. The lesion is focal and eccentric.  Left Circumflex Vessel is normal in caliber. The vessel is moderately calcified. The vessel is tortuous. The distal vessel terminates as a branching lateral OM into OM 2-OM 3 Mid Cx-1 lesion is 100% stenosed. Competitive flow from SVG-OM noted. Mid Cx-2 lesion is 5% stenosed. The lesion was previously treated using a bare metal stent and a drug  eluting stent between 1-2 years ago. Overlapping stents Dist Cx lesion is 70% stenosed.  First Obtuse Marginal Branch Vessel is small in size.  Fourth Obtuse Marginal Branch Vessel is small in size.  Left Posterior Atrioventricular Artery Vessel is small in size.  Right Coronary Artery Prox RCA lesion is 80% stenosed. Prox RCA to Mid RCA lesion is 30% stenosed. The lesion was previously treated using a drug eluting stent . Dist RCA lesion is 50% stenosed with 50% stenosed side branch in RPDA.  Acute Marginal Branch Vessel is small in size.  Right Posterior Atrioventricular Artery Vessel is small in size.  First Right Posterolateral Branch Vessel is small in size.  Saphenous Graft To RPDA SVG and is large. Prox Graft to Mid Graft lesion is 30% stenosed.  Saphenous Graft To 3rd Diag SVG and is normal in caliber.  The graft exhibits no disease.  Saphenous Graft To Dist Cx SVG and is large.  The graft exhibits no disease.  LIMA Graft To Mid LAD  Intervention  No interventions have been documented.   CARDIAC CATHETERIZATION  CARDIAC CATHETERIZATION 08/17/2021  Narrative Conclusions: Severe native coronary artery disease, as detailed below, including functional occlusions of the mid LAD and mid LCx with competitive flow from bypass grafts, 70% distal LMCA stenosis, and multifocal RCA disease of up to 70%. Widely patent LIMA-LAD, SVG-D3, and SVG-OM2. Patent SVG-RPDA with 30% proximal/mid graft stenosis. Moderately elevated left heart filling pressure with significant respiratory variation (LVEDP 30 mmHg end-expiratory, 20-32mmHg mean).  Recommendations: No targets for PCI evident on today's catheterization.  Optimize medical therapy; will add isosorbide mononitrate in place of nitroglycerin infusion. Diuresis given elevated LVEDP. Aggressive secondary prevention.  Yvonne Kendall, MD [facility]  Findings Coronary Findings Diagnostic  Dominance:  Right  Left Main Vessel is large. Mid LM to Ost LAD lesion is 70% stenosed with 55% stenosed side branch in Ost Cx to Prox Cx. The lesion is concentric. The lesion is mildly calcified. Ostial and proximal LCx disease is mildly calcified, eccentric and irregular  Left Anterior Descending There is mild diffuse disease throughout the vessel. Mid LAD lesion is 100% stenosed. Competitive flow noted from SVG-D and LIMA-LAD.  First Diagonal Branch Vessel is small in size. 1st Diag lesion is 60% stenosed.  Third Diagonal Branch Vessel is small in size. 3rd Diag lesion is 60% stenosed. The lesion is focal and eccentric.  Left Circumflex Vessel is normal in caliber. The vessel is moderately calcified. The vessel is tortuous. The distal vessel terminates as a branching lateral OM into OM 2-OM 3 Mid Cx-1 lesion is 100% stenosed. Competitive flow from SVG-OM noted. Mid Cx-2 lesion is 5% stenosed. The lesion was previously treated using a bare metal stent and a drug eluting stent between 1-2 years ago. Overlapping stents Dist Cx lesion is 70% stenosed.  First Obtuse Marginal Branch Vessel is small in size.  Fourth Obtuse Marginal Branch Vessel is small in size.  Left Posterior Atrioventricular Artery Vessel is small in size.  Right Coronary Artery Prox RCA lesion is 70% stenosed. Prox RCA to Mid RCA lesion is 40% stenosed. The lesion was previously treated using a drug eluting stent . Dist RCA lesion is 50% stenosed with 50% stenosed side branch in RPDA.  Acute Marginal Branch Vessel is small in size.  Right Posterior Atrioventricular Artery Vessel is small in size.  First Right Posterolateral Branch Vessel is small in size.  Saphenous Graft To RPDA SVG graft was visualized by angiography and is large. Prox Graft to Mid Graft lesion is 30% stenosed.  Saphenous Graft To 3rd Diag SVG graft was visualized by angiography and is normal in caliber.  The graft exhibits no  disease.  Saphenous Graft To Dist Cx SVG graft was visualized by angiography and is large.  The graft exhibits no disease.  LIMA LIMA Graft To Dist LAD LIMA graft was visualized by angiography and is normal in caliber.  The graft exhibits no disease.  Intervention  No interventions have been documented.   STRESS TESTS  MYOCARDIAL PERFUSION IMAGING 05/23/2019  Narrative  The left ventricular ejection fraction is moderately decreased (30-44%).  Nuclear stress EF: 44%. Mild diffuse hypokinesis. Left ventricular hypertrophy pattern noted.  This is a intermediate risk study based upon reduced EF. No ischemia.  No evidence of circumflex ischemia (prior PCI).  Donato Schultz, MD   ECHOCARDIOGRAM  ECHOCARDIOGRAM COMPLETE 04/27/2023  Narrative ECHOCARDIOGRAM REPORT    Patient Name:   Joseph [name] [facility] Date of Exam:  04/27/2023 Medical Rec #:  [id number]       Height:       77.0 in Accession #:    [id number]      Weight:       388.2 lb Date of Birth:  11-Sep-1971       BSA:          2.965 m Patient Age:    52 years        BP:           125/83 mmHg Patient Gender: M               HR:           82 bpm. Exam Location:  Inpatient  Procedure: 2D Echo, Cardiac Doppler, Color Doppler and Intracardiac Opacification Agent  Indications:    R06.02 SOB  History:        Patient has prior history of Echocardiogram examinations, most recent 01/07/2022. CAD and Previous Myocardial Infarction, Prior CABG, COPD, Signs/Symptoms:Chest Pain, Dyspnea, Shortness of Breath, Syncope and Edema; Risk Factors:Hypertension, Sleep Apnea, Diabetes and Current Smoker. Pulmonary embolus.  Sonographer:    Sheralyn Boatman RDCS Referring Phys: [id number] CARINA M BROWN   Sonographer Comments: Technically difficult study due to poor echo windows and patient is obese. Image acquisition challenging due to patient body habitus. IMPRESSIONS   1. Left ventricular ejection fraction, by estimation, is 55 to 60%. The left  ventricle has normal function. The left ventricle has no regional wall motion abnormalities. There is mild concentric left ventricular hypertrophy. Left ventricular diastolic parameters are consistent with Grade II diastolic dysfunction (pseudonormalization). 2. Right ventricular systolic function is normal. The right ventricular size is normal. Tricuspid regurgitation signal is inadequate for assessing PA pressure. 3. The mitral valve is normal in structure. No evidence of mitral valve regurgitation. No evidence of mitral stenosis. 4. The aortic valve appears functionally bicuspid with fused left and right coronary cusps. There is moderate calcification of the aortic valve. Aortic valve regurgitation is not visualized. Aortic valve sclerosis/calcification is present, without any evidence of aortic stenosis. Aortic valve mean gradient measures 9.0 mmHg. 5. Aortic dilatation noted. There is moderate dilatation of the aortic root, measuring 44 mm. There is moderate dilatation of the ascending aorta, measuring 45 mm. 6. The inferior vena cava is dilated in size with >50% respiratory variability, suggesting right atrial pressure of 8 mmHg.  FINDINGS Left Ventricle: Left ventricular ejection fraction, by estimation, is 55 to 60%. The left ventricle has normal function. The left ventricle has no regional wall motion abnormalities. Definity contrast agent was given IV to delineate the left ventricular endocardial borders. The left ventricular internal cavity size was normal in size. There is mild concentric left ventricular hypertrophy. Left ventricular diastolic parameters are consistent with Grade II diastolic dysfunction (pseudonormalization).  Right Ventricle: The right ventricular size is normal. No increase in right ventricular wall thickness. Right ventricular systolic function is normal. Tricuspid regurgitation signal is inadequate for assessing PA pressure.  Left Atrium: Left atrial size was normal  in size.  Right Atrium: Right atrial size was normal in size.  Pericardium: There is no evidence of pericardial effusion.  Mitral Valve: The mitral valve is normal in structure. There is mild calcification of the mitral valve leaflet(s). Mild mitral annular calcification. No evidence of mitral valve regurgitation. No evidence of mitral valve stenosis. MV peak gradient, 6.2 mmHg. The mean mitral valve gradient is 4.0 mmHg.  Tricuspid Valve: The tricuspid valve is normal in structure.  Tricuspid valve regurgitation is not demonstrated.  Aortic Valve: The aortic valve is bicuspid. There is moderate calcification of the aortic valve. Aortic valve regurgitation is not visualized. Aortic valve sclerosis/calcification is present, without any evidence of aortic stenosis. Aortic valve mean gradient measures 9.0 mmHg. Aortic valve peak gradient measures 15.8 mmHg. Aortic valve area, by VTI measures 2.83 cm.  Pulmonic Valve: The pulmonic valve was normal in structure. Pulmonic valve regurgitation is not visualized.  Aorta: Aortic dilatation noted. There is moderate dilatation of the aortic root, measuring 44 mm. There is moderate dilatation of the ascending aorta, measuring 45 mm.  Venous: The inferior vena cava is dilated in size with greater than 50% respiratory variability, suggesting right atrial pressure of 8 mmHg.  IAS/Shunts: No atrial level shunt detected by color flow Doppler.   LEFT VENTRICLE PLAX 2D LVIDd:         5.10 cm      Diastology LVIDs:         4.10 cm      LV e' medial:    6.20 cm/s LV PW:         1.30 cm      LV E/e' medial:  18.6 LV IVS:        1.20 cm      LV e' lateral:   7.18 cm/s LVOT diam:     2.70 cm      LV E/e' lateral: 16.0 LV SV:         109 LV SV Index:   37 LVOT Area:     5.73 cm  LV Volumes (MOD) LV vol d, MOD A2C: 152.0 ml LV vol d, MOD A4C: 240.0 ml LV vol s, MOD A2C: 57.8 ml LV vol s, MOD A4C: 115.0 ml LV SV MOD A2C:     94.2 ml LV SV MOD A4C:      240.0 ml LV SV MOD BP:      115.4 ml  RIGHT VENTRICLE            IVC RV S prime:     7.07 cm/s  IVC diam: 2.10 cm TAPSE (M-mode): 1.4 cm  LEFT ATRIUM             Index        RIGHT ATRIUM           Index LA diam:        3.80 cm 1.28 cm/m   RA Area:     14.80 cm LA Vol (A2C):   35.6 ml 12.01 ml/m  RA Volume:   32.80 ml  11.06 ml/m LA Vol (A4C):   43.2 ml 14.57 ml/m LA Biplane Vol: 39.8 ml 13.42 ml/m AORTIC VALVE AV Area (Vmax):    2.73 cm AV Area (Vmean):   2.77 cm AV Area (VTI):     2.83 cm AV Vmax:           199.00 cm/s AV Vmean:          135.000 cm/s AV VTI:            0.384 m AV Peak Grad:      15.8 mmHg AV Mean Grad:      9.0 mmHg LVOT Vmax:         94.80 cm/s LVOT Vmean:        65.300 cm/s LVOT VTI:          0.190 m LVOT/AV VTI ratio: 0.49  AORTA Ao Root diam: 4.40 cm Ao Asc  diam:  4.40 cm  MITRAL VALVE MV Area (PHT): 4.06 cm     SHUNTS MV Area VTI:   3.49 cm     Systemic VTI:  0.19 m MV Peak grad:  6.2 mmHg     Systemic Diam: 2.70 cm MV Mean grad:  4.0 mmHg MV Vmax:       1.25 m/s MV Vmean:      87.2 cm/s MV Decel Time: 187 msec MV E velocity: 115.13 cm/s MV A velocity: 107.77 cm/s MV E/A ratio:  1.07  Dalton McleanMD Electronically signed by Wilfred Lacy Signature Date/Time: 04/27/2023/4:10:47 PM    Final   TEE  ECHO INTRAOPERATIVE TEE 07/24/2020  Narrative *INTRAOPERATIVE TRANSESOPHAGEAL REPORT *    Patient Name:   Joseph [name] [facility] Date of Exam: 07/24/2020 Medical Rec #:  [id number]       Height:       77.0 in Accession #:    [id number]      Weight:       368.6 lb Date of Birth:  1971/08/20       BSA:          2.90 m Patient Age:    49 years        BP:           117/91 mmHg Patient Gender: M               HR:           67 bpm. Exam Location:  Anesthesiology  Transesophogeal exam was perform intraoperatively during surgical procedure. Patient was closely monitored under general anesthesia during the entirety  of examination.  Indications:     Coronary artery disease Sonographer:     Leta Jungling RDCS Performing Phys: [id number] Eliezer Lofts LIGHTFOOT Diagnosing Phys: Dorris Singh MD  POST-OP IMPRESSIONS - Left Ventricle: Post Bypass: The patient came off bypass on the initial attempt. There did not appear to be any new findings from preop exam. Left ventricular contraction did improve with volume replacement. The TEE that had been placed uneventfully after induction was removed without difficulty at the end of the case. The patient was later taken to the SICU in stable conditon.  PRE-OP FINDINGS Left Ventricle: The left ventricle has low normal systolic function, with an ejection fraction of 50-55%.  Right Ventricle: The right ventricle has normal systolic function.  Left Atrium: The left atrial appendage is well visualized and there is no evidence of thrombus present.   Mitral Valve: The mitral valve is normal in structure. Mitral valve regurgitation is trivial by color flow Doppler.  Tricuspid Valve: The tricuspid valve was normal in structure.  Aortic Valve: The aortic valve is tricuspid Aortic valve regurgitation was not visualized by color flow Doppler.  Pulmonic Valve: The pulmonic valve was normal in structure.    Aorta: The ascending aorta is normal in size and structure.   Dorris Singh MD Electronically signed by Dorris Singh MD Signature Date/Time: 07/24/2020/6:13:18 PM   Final            Risk Assessment/Calculations:   {Does this patient have ATRIAL FIBRILLATION?[phone]}        Lab Results  Component Value Date   WBC 8.2 05/03/2023   HGB 9.9 (L) 05/03/2023   HCT 31.7 (L) 05/03/2023   MCV 78.9 (L) 05/03/2023   PLT 220 05/03/2023   Lab Results  Component Value Date   CREATININE 2.23 (H) 05/03/2023   BUN 55 (H) 05/03/2023   NA 137 05/03/2023  K 4.1 05/03/2023   CL 99 05/03/2023   CO2 29 05/03/2023   Lab Results  Component Value Date   ALT  20 07/04/2022   AST 28 07/04/2022   ALKPHOS 73 07/04/2022   BILITOT 0.2 (L) 07/04/2022   Lab Results  Component Value Date   CHOL 136 05/02/2023   HDL 40 (L) 05/02/2023   LDLCALC 78 05/02/2023   LDLDIRECT 120.0 05/28/2015   TRIG 89 05/02/2023   CHOLHDL 3.4 05/02/2023    Lab Results  Component Value Date   HGBA1C 7.4 (H) 04/26/2023     Assessment & Plan    1.HFpEF: -Patient's last 2D echo was completed with EF of 55-60, no RWMA, moderate LVH, GRII DD, normal RVSF, trivial MR,  2.Coronary artery disease: -s/p CABG in 2021 with most recent right and left heart catheterization completed with clear grafts and normal RV pressure.  3.Bicuspid aortic valve: -Patient had diastolic murmur auscultated on examination today.  4.Nephrotic syndrome   5.  Bilateral BKA:       Disposition: Follow-up with Donato Schultz, MD or APP in *** months {Are you ordering a CV Procedure (e.g. stress test, cath, DCCV, TEE, etc)?   Press F2        :[id number]}   Medication Adjustments/Labs and Tests Ordered: Current medicines are reviewed at length with the patient today.  Concerns regarding medicines are outlined above.   Signed, Napoleon Form, Leodis Rains, NP 06/06/2023, 1:42 PM [facility]

## 2023-06-07 ENCOUNTER — Ambulatory Visit: Payer: Medicare HMO | Admitting: Nurse Practitioner

## 2023-06-07 DIAGNOSIS — I25119 Atherosclerotic heart disease of native coronary artery with unspecified angina pectoris: Secondary | ICD-10-CM

## 2023-06-07 DIAGNOSIS — N049 Nephrotic syndrome with unspecified morphologic changes: Secondary | ICD-10-CM

## 2023-06-07 DIAGNOSIS — I5032 Chronic diastolic (congestive) heart failure: Secondary | ICD-10-CM

## 2023-06-07 DIAGNOSIS — Z89511 Acquired absence of right leg below knee: Secondary | ICD-10-CM

## 2023-06-07 DIAGNOSIS — Q231 Congenital insufficiency of aortic valve: Secondary | ICD-10-CM

## 2023-06-09 ENCOUNTER — Encounter: Payer: Self-pay | Admitting: Physical Therapy

## 2023-06-09 ENCOUNTER — Ambulatory Visit: Payer: Medicare HMO | Admitting: Physical Therapy

## 2023-06-09 DIAGNOSIS — M6281 Muscle weakness (generalized): Secondary | ICD-10-CM | POA: Diagnosis not present

## 2023-06-09 DIAGNOSIS — R2689 Other abnormalities of gait and mobility: Secondary | ICD-10-CM

## 2023-06-09 DIAGNOSIS — R2681 Unsteadiness on feet: Secondary | ICD-10-CM

## 2023-06-09 NOTE — Therapy (Signed)
OUTPATIENT PHYSICAL THERAPY NEURO TREATMENT   Patient Name: Joseph [name] MRN: [id number] DOB:[birth year], 52 y.o., male Today's Date: 06/09/2023   PCP: Lucile Crater FNP-BC ([facility]) REFERRING PROVIDER: Carney Living, MD  END OF SESSION:  PT End of Session - 06/09/23 1411     Visit Number 3    Number of Visits 17   with eval   Date for PT Re-Evaluation 08/19/23    Authorization Type [employer]    Progress Note Due on Visit 10    PT Start Time 1408    PT Stop Time 1455    PT Time Calculation (min) 47 min    Equipment Utilized During Treatment Gait belt;Other (comment)   Bilateral BKA prosthetics   Activity Tolerance Patient tolerated treatment well    Behavior During Therapy WFL for tasks assessed/performed              Past Medical History:  Diagnosis Date   Aneurysm of ascending aorta (HCC) 09/14/2021   Anxiety    Arthritis    "knees, shoulders, hips, ankles" (07/29/2016)   Asthma    Bicuspid aortic valve 09/14/2021   CAD (coronary artery disease)    a. 2017: s/p BMS to distal Cx; b. LHC 05/30/2019: 80% mid, distal RCA s/p DES, 30% narrowing of d LM, widely patent LAD w/ luminal irregularities, widely patent stent in dCX  w/ 90+% stenosis distal to stent beofre small trifurcating obtuse marginal (potentially area of restenosis)   Chronic bronchitis (HCC)    Chronic ulcer of great toe of right foot (HCC) 09/09/2017   Chronic ulcer of right great toe (HCC) 09/19/2017   COPD (chronic obstructive pulmonary disease) (HCC)    Coronary arteriosclerosis    Depression    Diabetes mellitus (HCC)    DVT (deep venous thrombosis) (HCC)    GERD (gastroesophageal reflux disease)    Hyperlipidemia    Hypertension    Migraine    Morbid obesity (HCC)    Myocardial infarction (HCC) 1996   "light one"   PE (pulmonary embolism) 04/2013   On chronic Xarelto   Peripheral nerve disease    Pneumonia "several times"   Sleep apnea    Type II diabetes mellitus (HCC)     Past Surgical History:  Procedure Laterality Date   ABDOMINAL AORTOGRAM W/LOWER EXTREMITY N/A 10/10/2020   Procedure: ABDOMINAL AORTOGRAM W/LOWER EXTREMITY;  Surgeon: Sherren Kerns, MD;  Location: [facility] INVASIVE CV LAB;  Service: Cardiovascular;  Laterality: N/A;   AMPUTATION Right 09/21/2017   Procedure: RIGHT GREAT TOE AMPUTATION, POSSIBLE VAC;  Surgeon: Tarry Kos, MD;  Location: [facility] OR;  Service: Orthopedics;  Laterality: Right;   AMPUTATION Left 08/13/2020   Procedure: LEFT FOOT 4TH RAY AMPUTATION;  Surgeon: [name], MD;  Location: [facility] OR;  Service: Orthopedics;  Laterality: Left;   AMPUTATION Left 11/20/2021   Procedure: LEFT TRANSMETATARSAL AMPUTATION;  Surgeon: [name], MD;  Location: [facility] OR;  Service: Orthopedics;  Laterality: Left;   AMPUTATION Left 01/08/2022   Procedure: LEFT BELOW KNEE AMPUTATION;  Surgeon: [name], MD;  Location: [facility] OR;  Service: Orthopedics;  Laterality: Left;   AMPUTATION Right 07/07/2022   Procedure: RIGHT BELOW KNEE AMPUTATION;  Surgeon: [name], MD;  Location: [facility] OR;  Service: Orthopedics;  Laterality: Right;   AORTOGRAM Bilateral 03/13/2021   Procedure: ABDOMINAL AORTOGRAM WITH Left LOWER EXTREMITY RUNOFF;  Surgeon: Leonie Douglas, MD;  Location: [facility] OR;  Service: Vascular;  Laterality: Bilateral;  APPLICATION OF WOUND VAC  01/08/2022   Procedure: APPLICATION OF WOUND VAC;  Surgeon: [name], MD;  Location: [facility] OR;  Service: Orthopedics;;   APPLICATION OF WOUND VAC Right 07/07/2022   Procedure: APPLICATION OF WOUND VAC;  Surgeon: [name], MD;  Location: [facility] OR;  Service: Orthopedics;  Laterality: Right;   CARDIAC CATHETERIZATION  2006   CARDIAC CATHETERIZATION  1996   "@ [facility]; when I had my heart attack"   CARDIAC CATHETERIZATION N/A 07/29/2016   Procedure: Left Heart Cath and Coronary Angiography;  Surgeon: Runell Gess, MD;  Location: [facility] INVASIVE CV LAB;  Service: Cardiovascular;  Laterality: N/A;   CARDIAC  CATHETERIZATION N/A 07/29/2016   Procedure: Coronary Stent Intervention;  Surgeon: Runell Gess, MD;  Location: [facility];  Service: Cardiovascular;  Laterality: N/A;   CARPAL TUNNEL RELEASE Bilateral    CORONARY ANGIOPLASTY WITH STENT PLACEMENT  07/29/2016   CORONARY ARTERY BYPASS GRAFT N/A 07/24/2020   Procedure: CORONARY ARTERY BYPASS GRAFTING (CABG), ON PUMP, TIMES FOUR, USING LEFT INTERNAL MAMMARY ARTERY AND ENDOSCOPICALLY HARVESTED RIGHT GREATER SAPHENOUS VEIN;  Surgeon: Corliss Skains, MD;  Location: [facility] OR;  Service: Open Heart Surgery;  Laterality: N/A;  FLOW TAC   CORONARY PRESSURE/FFR STUDY N/A 07/22/2020   Procedure: INTRAVASCULAR PRESSURE WIRE/FFR STUDY;  Surgeon: Marykay Lex, MD;  Location: [facility] INVASIVE CV LAB;  Service: Cardiovascular;  Laterality: N/A;   CORONARY STENT INTERVENTION N/A 05/30/2019   Procedure: CORONARY STENT INTERVENTION;  Surgeon: Lyn Records, MD;  Location: [facility];  Service: Cardiovascular;  Laterality: N/A;   CORONARY STENT INTERVENTION N/A 11/02/2019   Procedure: CORONARY STENT INTERVENTION;  Surgeon: Iran Ouch, MD;  [facility]: [facility];  Service: Cardiovascular;  Laterality: N/A;   ESOPHAGOGASTRODUODENOSCOPY N/A 09/22/2017   Procedure: ESOPHAGOGASTRODUODENOSCOPY (EGD);  Surgeon: Meryl Dare, MD;  Location: [facility] ENDOSCOPY;  Service: Endoscopy;  Laterality: N/A;   KNEE ARTHROSCOPY Bilateral    "2 on left; 1 on the right"   LEFT HEART CATH AND CORONARY ANGIOGRAPHY N/A 11/02/2019   Procedure: LEFT HEART CATH AND CORONARY ANGIOGRAPHY;  Surgeon: Iran Ouch, MD;  [facility]: [facility];  Service: Cardiovascular;  Laterality: N/A;   LEFT HEART CATH AND CORONARY ANGIOGRAPHY N/A 07/22/2020   Procedure: LEFT HEART CATH AND CORONARY ANGIOGRAPHY;  Surgeon: Marykay Lex, MD;  Location: [facility] INVASIVE CV LAB;  Service: Cardiovascular;  Laterality: N/A;   LEFT HEART CATH AND CORS/GRAFTS ANGIOGRAPHY N/A 08/17/2021    Procedure: LEFT HEART CATH AND CORS/GRAFTS ANGIOGRAPHY;  Surgeon: Yvonne Kendall, MD;  [facility]: [facility];  Service: Cardiovascular;  Laterality: N/A;   PERIPHERAL VASCULAR INTERVENTION Left 10/11/2020   popliteal and SFA stent placement    PERIPHERAL VASCULAR INTERVENTION Left 10/10/2020   Procedure: PERIPHERAL VASCULAR INTERVENTION;  Surgeon: Sherren Kerns, MD;  [facility]: [facility];  Service: Cardiovascular;  Laterality: Left;   RIGHT/LEFT HEART CATH AND CORONARY ANGIOGRAPHY N/A 05/30/2019   Procedure: RIGHT/LEFT HEART CATH AND CORONARY ANGIOGRAPHY;  Surgeon: Lyn Records, MD;  [facility]: [facility];  Service: Cardiovascular;  Laterality: N/A;   RIGHT/LEFT HEART CATH AND CORONARY/GRAFT ANGIOGRAPHY N/A 06/17/2022   Procedure: RIGHT/LEFT HEART CATH AND CORONARY/GRAFT ANGIOGRAPHY;  Surgeon: Lennette Bihari, MD;  Location: [facility];  Service: Cardiovascular;  Laterality: N/A;   SHOULDER OPEN ROTATOR CUFF REPAIR Bilateral    TEE WITHOUT CARDIOVERSION N/A 07/24/2020   Procedure: TRANSESOPHAGEAL ECHOCARDIOGRAM (TEE);  Surgeon: Corliss Skains, MD;  Location: [facility] OR;  Service: Open Heart Surgery;  Laterality: N/A;   Patient Active Problem List   Diagnosis Date Noted   AKI (acute kidney injury) (HCC) 04/29/2023   Weakness 04/28/2023   Nephrosis 04/27/2023   CHF (congestive heart failure) (HCC) 04/26/2023   OSA (obstructive sleep apnea) 04/26/2023   Preoperative cardiovascular examination 04/25/2023   Hx of iron deficiency anemia 07/07/2022   Syncope and collapse 06/15/2022   AMS (altered mental status) 04/14/2022   Acute respiratory failure with hypoxia (HCC) 04/14/2022   Acute metabolic encephalopathy 04/14/2022   Pneumonia 04/14/2022   Ulcer of right foot, limited to breakdown of skin (HCC)    Below-knee amputation of left lower extremity (HCC)    Osteomyelitis of foot, right, acute (HCC) 01/06/2022   Bipolar disorder with depression (HCC)     Sepsis (HCC) 11/18/2021   Chest pain 11/18/2021   Dyspnea 11/18/2021   Wound infection    Ulcer of left foot with necrosis of bone (HCC)    Bicuspid aortic valve 09/14/2021   Aneurysm of ascending aorta (HCC) 09/14/2021   DM (diabetes mellitus) with peripheral vascular complication (HCC) 08/20/2021   Atypical chest pain 07/17/2021   Hyponatremia 07/17/2021   Leg wound, left 07/17/2021   Essential hypertension 07/17/2021   Acute hyperglycemia 07/09/2021   Non-ST elevation (NSTEMI) myocardial infarction (HCC) 07/22/2020   Heart failure with preserved ejection fraction (HCC) 07/22/2020   Hypotension 07/18/2020   Elevated troponin 07/18/2020   Acute renal failure superimposed on stage 3a chronic kidney disease (HCC) 07/18/2020   Elevated brain natriuretic peptide (BNP) level 07/18/2020   Edema 06/13/2019   Leukocytosis 05/31/2019   Smoker 03/08/2019   Chronic back pain 02/16/2019   Deep venous thrombosis (HCC) 02/16/2019   Obesity, Class III, BMI 40-49.9 (morbid obesity) (HCC) 02/16/2019   Peripheral nerve disease 02/16/2019   COPD (chronic obstructive pulmonary disease) (HCC) 01/22/2019   Anxiety and depression 03/26/2018   Sinusitis 10/12/2017   Abdominal pain    Loss of weight    Diabetic foot infection (HCC) 09/16/2017   Cellulitis of right foot    Recurrent chest pain 07/29/2016   Hyperglycemia    Insulin dependent type 2 diabetes mellitus (HCC) 04/29/2015   History of pulmonary embolus (PE) 04/27/2013   CAD (coronary artery disease) 09/10/2008    ONSET DATE: 04/27/2023  REFERRING DIAG: R53.1 (ICD-10-CM) - Weakness  THERAPY DIAG:  Muscle weakness (generalized)  Other abnormalities of gait and mobility  Unsteadiness on feet  Rationale for Evaluation and Treatment: Rehabilitation  SUBJECTIVE:  SUBJECTIVE STATEMENT: Pt has increased pain today, mostly in BUEs and back.  He had a single fall at home since last visit.  He went to adjust in chair and fell forward.  He landed on his forehead and has been having headaches since.  He cannot tell if he has any changes in his vision due to left eye bleeding behind retina causing visual disturbance at baseline.  Denies other acute status changes.  Presents in lobby with prosthetics off and full bag of socks.     Pt accompanied by: self  PERTINENT HISTORY:  PMH signficant for AAA aneurysm, anxiety, asthma, CAD, COPD, DM, DVT, HNT, migraine, morbid obesity, MI, PE, peripheral nerve disease, B BKA with prosthetics, CABG, bilateral shoulder open rotator cuff repair.  PAIN:  Are you having pain? Yes: NPRS scale: 8/10 Pain location: Bilateral shoulders, low back, total body Pain description: achy  PRECAUTIONS: Fall and Other: B BKA  WEIGHT BEARING RESTRICTIONS: No  FALLS: Has patient fallen in last 6 months? Yes. Number of falls 2-3; per pt was not injured during fall; per pt he can get back up by himself  LIVING ENVIRONMENT: Lives with: lives with their spouse Lives in: House/apartment Stairs: No Has following equipment at home: Environmental consultant - 2 wheeled, Wheelchair (power), Wheelchair (manual), and Ramped entry  PLOF: Independent with gait, Independent with transfers, and Requires assistive device for independence  PATIENT GOALS: "to walk"  OBJECTIVE:   DIAGNOSTIC FINDINGS:  None relevant to this POC  COGNITION: Overall cognitive status:  decreased memory per patient report   SENSATION: Impaired due to neuropathy in residual limbs  POSTURE: rounded shoulders, forward head, and posterior pelvic tilt  LOWER EXTREMITY ROM:   *not assessed at eval  Active  Right Eval Left Eval  Hip flexion    Hip extension    Hip abduction    Hip adduction    Hip internal rotation    Hip external rotation    Knee flexion    Knee  extension    Ankle dorsiflexion    Ankle plantarflexion    Ankle inversion    Ankle eversion     (Blank rows = not tested)  LOWER EXTREMITY MMT:  *not assessed at eval  MMT Right Eval Left Eval  Hip flexion    Hip extension    Hip abduction    Hip adduction    Hip internal rotation    Hip external rotation    Knee flexion    Knee extension    Ankle dorsiflexion    Ankle plantarflexion    Ankle inversion    Ankle eversion    (Blank rows = not tested)  VITALS  There were no vitals filed for this visit.    TODAY'S TREATMENT:       -PT observes pt don liners and prosthetics independently.  He is able to get right prosthetic on without silicone liner, PT assessed fit of silicone liner against limb and pin lock liner without ability to reach bottom of silicone inner socket liner with bottom point of pin.  Had pt don 5-ply and 3-ply re-donning left prosthesis between to assess fit.  Pt continues to bottom out in socket so had him doff 3-ply and don second 5-ply for total 10 vs 8-ply.  Education on ensuring socks don't get caught on pin or in lock mechanism.  Had pt don and doff right prosthesis without inner silicone liner, had him don 5-ply sock and re-donn prosthetic for fit assessment.  Pt  does well with 5 + 3-ply for total of 8-ply on the right.  Pt ambulates to SciFit x45' from [facility] CGA w/ bariatric RW. -SciFit x8 minutes for fluid movement in BLE and reciprocal mobility progressing to level 4.0 initially using BUE/BLE dropped to BLE only due to right shoulder discomfort after 3 minutes.   -Re-donned right prosthetic w/ inner silicone liner as right foot goes into ER w/ walking x45' from SciFit to [facility] CGA w/ bariatric RW. -115' CGA w/ bariatric RW w/ improved alignment on BLE, pt fatigues with distance, good and safe approach to sit in [facility]  PATIENT EDUCATION: Education details: Bring socks to next session, measuring L residual limb to track fluid volume changes, using stepper at  gym [employer] gives him some gym access) if can access via stand step transfer w/ walker or pulling PWC up to stepper and using this for fluid management and strengthening and aerobic conditioning in order to then don right inner liner and maintain limb shape w/ tighter prosthetic component. Person educated: Patient Education method: Explanation Education comprehension: verbalized understanding and needs further education  HOME EXERCISE PROGRAM: To be initiated  GOALS: Goals reviewed with patient? Yes  SHORT TERM GOALS: Target date: 06/25/2023  Pt will be independent with initial HEP for improved strength, balance, transfers and gait. Baseline: Goal status: INITIAL  2.  Pt will perform sit to stand transfers w/SBA and LRAD for improved independence and BLE strength Baseline: CGA Goal status: REVISED  3. Pt will ascend/descend ramp w/LRAD and CGA for improved safety w/household ambulation  Baseline: not assessed - pt has video of ramp navigation using rails Goal status: REVISED  4.  Pt to demonstrate compliance with BLE prosthetic wear schedule and residual limb skin checks as well as residual limb measurement  Baseline:  Goal status: REVISED   LONG TERM GOALS: Target date: 07/23/2023  Pt will be independent with final HEP for improved strength, balance, transfers and gait. Baseline:  Goal status: INITIAL  2.  Pt will be mod I w/ all transfers w/LRAD for improved independence and safety w/prosthetics  Baseline: CGA Goal status: REVISED  3.  Pt will ambulate >200' on level surfaces w/LRAD and SBA for improved endurance, safety w/prosthetics and strength  Baseline:  Goal status: REVISED   ASSESSMENT:  CLINICAL IMPRESSION: Focus of skilled session on having patient practice efficiently and effectively adjusting sock ply and managing limb volume for improved fit of bilateral prosthetics.  He requires 10-ply initially with left prosthesis and 8-ply on the right without the  silicone inner liner for the right prosthesis as limb volume is too great at onset of session.  Upon completing SciFit pt limb volume had decreased enough he was able to maintain same ply with silicone liner donned.  Encouraged use of stepper style task in gym environment for strengthening but also fluid management when adjusting prosthetics for prolonged wear at home.  He continues to benefit from further PT interventions as he continues to need work on Geologist, engineering of gait, general fatigue, and prosthetic fit for safety.  OBJECTIVE IMPAIRMENTS: decreased balance, decreased endurance, decreased knowledge of use of DME, decreased mobility, difficulty walking, prosthetic dependency , and obesity.   ACTIVITY LIMITATIONS: standing, squatting, and stairs  PARTICIPATION LIMITATIONS: driving and community activity  PERSONAL FACTORS: Fitness, Transportation, and 3+ comorbidities:    AAA aneurysm, anxiety, asthma, CAD, COPD, DM, DVT, HNT, migraine, morbid obesity, MI, PE, peripheral nerve disease, B BKA with prosthetics, CABG, bilateral shoulder open rotator cuff repair.are  also affecting patient's functional outcome.   REHAB POTENTIAL: Good  CLINICAL DECISION MAKING: Stable/uncomplicated  EVALUATION COMPLEXITY: Moderate  PLAN:  PT FREQUENCY: 2x/week  PT DURATION: 8 weeks  PLANNED INTERVENTIONS: Therapeutic exercises, Therapeutic activity, Neuromuscular re-education, Balance training, Gait training, Patient/Family education, Self Care, Prosthetic training, DME instructions, Wheelchair mobility training, scar mobilization, Manual therapy, and Re-evaluation  PLAN FOR NEXT SESSION: Did he bring socks? (Possible +2) gait due to instability of L BKA prosthetic. Establish HEP - basic standing stuff or prone for posterior chain strength and stability. Scifit for endurance    Sadie Haber, PT, DPT  06/09/2023, 2:59 PM

## 2023-06-14 ENCOUNTER — Encounter: Payer: Self-pay | Admitting: Physical Therapy

## 2023-06-14 ENCOUNTER — Ambulatory Visit: Payer: Medicare HMO | Admitting: Physical Therapy

## 2023-06-14 VITALS — BP 117/71 | HR 78

## 2023-06-14 DIAGNOSIS — R2689 Other abnormalities of gait and mobility: Secondary | ICD-10-CM

## 2023-06-14 DIAGNOSIS — R2681 Unsteadiness on feet: Secondary | ICD-10-CM

## 2023-06-14 DIAGNOSIS — M6281 Muscle weakness (generalized): Secondary | ICD-10-CM

## 2023-06-14 NOTE — Therapy (Unsigned)
OUTPATIENT PHYSICAL THERAPY NEURO TREATMENT   Patient Name: Joseph [name] MRN: [id number] DOB:November 29, 1971, 52 y.o., male Today's Date: 06/14/2023   PCP: Lucile Crater FNP-BC ([facility]) REFERRING PROVIDER: Carney Living, MD  END OF SESSION:  PT End of Session - 06/14/23 1453     Visit Number 4    Number of Visits 17   with eval   Date for PT Re-Evaluation 08/19/23    Authorization Type [employer]    Progress Note Due on Visit 10    PT Start Time 1448    PT Stop Time 1530    PT Time Calculation (min) 42 min    Equipment Utilized During Treatment Gait belt;Other (comment)   Bilateral BKA prosthetics   Activity Tolerance Patient tolerated treatment well;Patient limited by fatigue;Patient limited by pain    Behavior During Therapy [facility] for tasks assessed/performed              Past Medical History:  Diagnosis Date   Aneurysm of ascending aorta (HCC) 09/14/2021   Anxiety    Arthritis    "knees, shoulders, hips, ankles" (07/29/2016)   Asthma    Bicuspid aortic valve 09/14/2021   CAD (coronary artery disease)    a. 2017: s/p BMS to distal Cx; b. LHC 05/30/2019: 80% mid, distal RCA s/p DES, 30% narrowing of d LM, widely patent LAD w/ luminal irregularities, widely patent stent in dCX  w/ 90+% stenosis distal to stent beofre small trifurcating obtuse marginal (potentially area of restenosis)   Chronic bronchitis (HCC)    Chronic ulcer of great toe of right foot (HCC) 09/09/2017   Chronic ulcer of right great toe (HCC) 09/19/2017   COPD (chronic obstructive pulmonary disease) (HCC)    Coronary arteriosclerosis    Depression    Diabetes mellitus (HCC)    DVT (deep venous thrombosis) (HCC)    GERD (gastroesophageal reflux disease)    Hyperlipidemia    Hypertension    Migraine    Morbid obesity (HCC)    Myocardial infarction (HCC) 1996   "light one"   PE (pulmonary embolism) 04/2013   On chronic Xarelto   Peripheral nerve disease    Pneumonia "several times"    Sleep apnea    Type II diabetes mellitus (HCC)    Past Surgical History:  Procedure Laterality Date   ABDOMINAL AORTOGRAM W/LOWER EXTREMITY N/A 10/10/2020   Procedure: ABDOMINAL AORTOGRAM W/LOWER EXTREMITY;  Surgeon: Sherren Kerns, MD;  Location: [facility];  Service: Cardiovascular;  Laterality: N/A;   AMPUTATION Right 09/21/2017   Procedure: RIGHT GREAT TOE AMPUTATION, POSSIBLE VAC;  Surgeon: Tarry Kos, MD;  Location: [facility] OR;  Service: Orthopedics;  Laterality: Right;   AMPUTATION Left 08/13/2020   Procedure: LEFT FOOT 4TH RAY AMPUTATION;  Surgeon: [name], MD;  Location: [facility] OR;  Service: Orthopedics;  Laterality: Left;   AMPUTATION Left 11/20/2021   Procedure: LEFT TRANSMETATARSAL AMPUTATION;  Surgeon: [name], MD;  Location: [facility] OR;  Service: Orthopedics;  Laterality: Left;   AMPUTATION Left 01/08/2022   Procedure: LEFT BELOW KNEE AMPUTATION;  Surgeon: [name], MD;  Location: [facility] OR;  Service: Orthopedics;  Laterality: Left;   AMPUTATION Right 07/07/2022   Procedure: RIGHT BELOW KNEE AMPUTATION;  Surgeon: [name], MD;  Location: [facility] OR;  Service: Orthopedics;  Laterality: Right;   AORTOGRAM Bilateral 03/13/2021   Procedure: ABDOMINAL AORTOGRAM WITH Left LOWER EXTREMITY RUNOFF;  Surgeon: Leonie Douglas, MD;  Location: [facility] OR;  Service: Vascular;  Laterality: Bilateral;   APPLICATION OF WOUND VAC  01/08/2022   Procedure: APPLICATION OF WOUND VAC;  Surgeon: [name], MD;  Location: [facility] OR;  Service: Orthopedics;;   APPLICATION OF WOUND VAC Right 07/07/2022   Procedure: APPLICATION OF WOUND VAC;  Surgeon: [name], MD;  Location: [facility] OR;  Service: Orthopedics;  Laterality: Right;   CARDIAC CATHETERIZATION  2006   CARDIAC CATHETERIZATION  1996   "@ [facility]; when I had my heart attack"   CARDIAC CATHETERIZATION N/A 07/29/2016   Procedure: Left Heart Cath and Coronary Angiography;  Surgeon: Runell Gess, MD;  Location: [facility] INVASIVE CV LAB;   Service: Cardiovascular;  Laterality: N/A;   CARDIAC CATHETERIZATION N/A 07/29/2016   Procedure: Coronary Stent Intervention;  Surgeon: Runell Gess, MD;  Location: [facility];  Service: Cardiovascular;  Laterality: N/A;   CARPAL TUNNEL RELEASE Bilateral    CORONARY ANGIOPLASTY WITH STENT PLACEMENT  07/29/2016   CORONARY ARTERY BYPASS GRAFT N/A 07/24/2020   Procedure: CORONARY ARTERY BYPASS GRAFTING (CABG), ON PUMP, TIMES FOUR, USING LEFT INTERNAL MAMMARY ARTERY AND ENDOSCOPICALLY HARVESTED RIGHT GREATER SAPHENOUS VEIN;  Surgeon: Corliss Skains, MD;  Location: [facility] OR;  Service: Open Heart Surgery;  Laterality: N/A;  FLOW TAC   CORONARY PRESSURE/FFR STUDY N/A 07/22/2020   Procedure: INTRAVASCULAR PRESSURE WIRE/FFR STUDY;  Surgeon: Marykay Lex, MD;  Location: [facility] INVASIVE CV LAB;  Service: Cardiovascular;  Laterality: N/A;   CORONARY STENT INTERVENTION N/A 05/30/2019   Procedure: CORONARY STENT INTERVENTION;  Surgeon: Lyn Records, MD;  Location: [facility];  Service: Cardiovascular;  Laterality: N/A;   CORONARY STENT INTERVENTION N/A 11/02/2019   Procedure: CORONARY STENT INTERVENTION;  Surgeon: Iran Ouch, MD;  Location: [facility];  Service: Cardiovascular;  Laterality: N/A;   ESOPHAGOGASTRODUODENOSCOPY N/A 09/22/2017   Procedure: ESOPHAGOGASTRODUODENOSCOPY (EGD);  Surgeon: Meryl Dare, MD;  Location: [facility] ENDOSCOPY;  Service: Endoscopy;  Laterality: N/A;   KNEE ARTHROSCOPY Bilateral    "2 on left; 1 on the right"   LEFT HEART CATH AND CORONARY ANGIOGRAPHY N/A 11/02/2019   Procedure: LEFT HEART CATH AND CORONARY ANGIOGRAPHY;  Surgeon: Iran Ouch, MD;  Location: [facility];  Service: Cardiovascular;  Laterality: N/A;   LEFT HEART CATH AND CORONARY ANGIOGRAPHY N/A 07/22/2020   Procedure: LEFT HEART CATH AND CORONARY ANGIOGRAPHY;  Surgeon: Marykay Lex, MD;  Location: [facility] INVASIVE CV LAB;  Service: Cardiovascular;  Laterality: N/A;   LEFT  HEART CATH AND CORS/GRAFTS ANGIOGRAPHY N/A 08/17/2021   Procedure: LEFT HEART CATH AND CORS/GRAFTS ANGIOGRAPHY;  Surgeon: Yvonne Kendall, MD;  Location: [facility];  Service: Cardiovascular;  Laterality: N/A;   PERIPHERAL VASCULAR INTERVENTION Left 10/11/2020   popliteal and SFA stent placement    PERIPHERAL VASCULAR INTERVENTION Left 10/10/2020   Procedure: PERIPHERAL VASCULAR INTERVENTION;  Surgeon: Sherren Kerns, MD;  Location: [facility];  Service: Cardiovascular;  Laterality: Left;   RIGHT/LEFT HEART CATH AND CORONARY ANGIOGRAPHY N/A 05/30/2019   Procedure: RIGHT/LEFT HEART CATH AND CORONARY ANGIOGRAPHY;  Surgeon: Lyn Records, MD;  [facility]: [facility];  Service: Cardiovascular;  Laterality: N/A;   RIGHT/LEFT HEART CATH AND CORONARY/GRAFT ANGIOGRAPHY N/A 06/17/2022   Procedure: RIGHT/LEFT HEART CATH AND CORONARY/GRAFT ANGIOGRAPHY;  Surgeon: Lennette Bihari, MD;  Location: [facility];  Service: Cardiovascular;  Laterality: N/A;   SHOULDER OPEN ROTATOR CUFF REPAIR Bilateral    TEE WITHOUT CARDIOVERSION N/A  07/24/2020   Procedure: TRANSESOPHAGEAL ECHOCARDIOGRAM (TEE);  Surgeon: Corliss Skains, MD;  Location: [facility] OR;  Service: Open Heart Surgery;  Laterality: N/A;   Patient Active Problem List   Diagnosis Date Noted   AKI (acute kidney injury) (HCC) 04/29/2023   Weakness 04/28/2023   Nephrosis 04/27/2023   CHF (congestive heart failure) (HCC) 04/26/2023   OSA (obstructive sleep apnea) 04/26/2023   Preoperative cardiovascular examination 04/25/2023   Hx of iron deficiency anemia 07/07/2022   Syncope and collapse 06/15/2022   AMS (altered mental status) 04/14/2022   Acute respiratory failure with hypoxia (HCC) 04/14/2022   Acute metabolic encephalopathy 04/14/2022   Pneumonia 04/14/2022   Ulcer of right foot, limited to breakdown of skin (HCC)    Below-knee amputation of left lower extremity (HCC)    Osteomyelitis of foot, right, acute (HCC)  01/06/2022   Bipolar disorder with depression (HCC)    Sepsis (HCC) 11/18/2021   Chest pain 11/18/2021   Dyspnea 11/18/2021   Wound infection    Ulcer of left foot with necrosis of bone (HCC)    Bicuspid aortic valve 09/14/2021   Aneurysm of ascending aorta (HCC) 09/14/2021   DM (diabetes mellitus) with peripheral vascular complication (HCC) 08/20/2021   Atypical chest pain 07/17/2021   Hyponatremia 07/17/2021   Leg wound, left 07/17/2021   Essential hypertension 07/17/2021   Acute hyperglycemia 07/09/2021   Non-ST elevation (NSTEMI) myocardial infarction (HCC) 07/22/2020   Heart failure with preserved ejection fraction (HCC) 07/22/2020   Hypotension 07/18/2020   Elevated troponin 07/18/2020   Acute renal failure superimposed on stage 3a chronic kidney disease (HCC) 07/18/2020   Elevated brain natriuretic peptide (BNP) level 07/18/2020   Edema 06/13/2019   Leukocytosis 05/31/2019   Smoker 03/08/2019   Chronic back pain 02/16/2019   Deep venous thrombosis (HCC) 02/16/2019   Obesity, Class III, BMI 40-49.9 (morbid obesity) (HCC) 02/16/2019   Peripheral nerve disease 02/16/2019   COPD (chronic obstructive pulmonary disease) (HCC) 01/22/2019   Anxiety and depression 03/26/2018   Sinusitis 10/12/2017   Abdominal pain    Loss of weight    Diabetic foot infection (HCC) 09/16/2017   Cellulitis of right foot    Recurrent chest pain 07/29/2016   Hyperglycemia    Insulin dependent type 2 diabetes mellitus (HCC) 04/29/2015   History of pulmonary embolus (PE) 04/27/2013   CAD (coronary artery disease) 09/10/2008    ONSET DATE: 04/27/2023  REFERRING DIAG: R53.1 (ICD-10-CM) - Weakness  THERAPY DIAG:  Muscle weakness (generalized)  Other abnormalities of gait and mobility  Unsteadiness on feet  Rationale for Evaluation and Treatment: Rehabilitation  SUBJECTIVE:  SUBJECTIVE STATEMENT: Pt has pain mostly in bilateral shoulders and hips.  He has had no further falls.  Since my recent fall roughly 2 weeks ago his headaches are getting better.  Denies other acute status changes.  Presents in lobby with prosthetics donned with both inner silicone liners inside sockets wearing 8-ply on the right and 10-ply on the left and full bag of socks.     Pt accompanied by: self  PERTINENT HISTORY:  PMH signficant for AAA aneurysm, anxiety, asthma, CAD, COPD, DM, DVT, HNT, migraine, morbid obesity, MI, PE, peripheral nerve disease, B BKA with prosthetics, CABG, bilateral shoulder open rotator cuff repair.  PAIN:  Are you having pain? Yes: NPRS scale: 37/10 Pain location: Bilateral shoulders and hips are worst, total body Pain description: achy  PRECAUTIONS: Fall and Other: B BKA  WEIGHT BEARING RESTRICTIONS: No  FALLS: Has patient fallen in last 6 months? Yes. Number of falls 2-3; per pt was not injured during fall; per pt he can get back up by himself  LIVING ENVIRONMENT: Lives with: lives with their spouse Lives in: House/apartment Stairs: No Has following equipment at home: Environmental consultant - 2 wheeled, Wheelchair (power), Wheelchair (manual), and Ramped entry  PLOF: Independent with gait, Independent with transfers, and Requires assistive device for independence  PATIENT GOALS: "to walk"  OBJECTIVE:   DIAGNOSTIC FINDINGS:  None relevant to this POC  COGNITION: Overall cognitive status:  decreased memory per patient report   SENSATION: Impaired due to neuropathy in residual limbs  POSTURE: rounded shoulders, forward head, and posterior pelvic tilt  LOWER EXTREMITY ROM:   *not assessed at eval  Active  Right Eval Left Eval  Hip flexion    Hip extension    Hip abduction    Hip adduction    Hip internal rotation    Hip external rotation    Knee flexion    Knee extension     Ankle dorsiflexion    Ankle plantarflexion    Ankle inversion    Ankle eversion     (Blank rows = not tested)  LOWER EXTREMITY MMT:  *not assessed at eval  MMT Right Eval Left Eval  Hip flexion    Hip extension    Hip abduction    Hip adduction    Hip internal rotation    Hip external rotation    Knee flexion    Knee extension    Ankle dorsiflexion    Ankle plantarflexion    Ankle inversion    Ankle eversion    (Blank rows = not tested)  VITALS  Vitals:   06/14/23 1502  BP: 117/71  Pulse: 78      TODAY'S TREATMENT:       -115' CGA w/ bariatric RW w/ stable forward alignment on BLE, pt fatigues with distance and has pressure in RLE worsened by distance (pt reports it feels as if the knee if moving forward too far), good and safe approach to sit in PWC.  Pt doffs right prosthetic for assessment, mild redness in appropriate weight-bearing areas, palpated laterally to patella with pt reporting pain with palpation/pressure.  Re-dons independently-singular cue to correct sock to prevent covering pin. -Pt nauseous following re-donning right prosthetic because of heat in open gym.  Required seated rest to return to baseline. -115' CGA w/ bariatric RW w/ stable forward alignment on BLE, mildly increased ER of right prosthetic w/ min cues for improved swing through, mildly increased forward trunk lean  PATIENT EDUCATION: Education details: Bring socks to  next session, measuring L residual limb to track fluid volume changes, continue trying to don right prosthetic with inner liner if fluid volume allows.  Discussed trialing an introductory HEP next visit now that prosthetic fit is stable for the time being and he is doing better with sock adjustment. Person educated: Patient Education method: Explanation Education comprehension: verbalized understanding and needs further education  HOME EXERCISE PROGRAM: To be initiated  GOALS: Goals reviewed with patient? Yes  SHORT TERM  GOALS: Target date: 06/25/2023  Pt will be independent with initial HEP for improved strength, balance, transfers and gait. Baseline: Goal status: INITIAL  2.  Pt will perform sit to stand transfers w/SBA and LRAD for improved independence and BLE strength Baseline: CGA Goal status: REVISED  3. Pt will ascend/descend ramp w/LRAD and CGA for improved safety w/household ambulation  Baseline: not assessed - pt has video of ramp navigation using rails Goal status: REVISED  4.  Pt to demonstrate compliance with BLE prosthetic wear schedule and residual limb skin checks as well as residual limb measurement  Baseline:  Goal status: REVISED   LONG TERM GOALS: Target date: 07/23/2023  Pt will be independent with final HEP for improved strength, balance, transfers and gait. Baseline:  Goal status: INITIAL  2.  Pt will be mod I w/ all transfers w/LRAD for improved independence and safety w/prosthetics  Baseline: CGA Goal status: REVISED  3.  Pt will ambulate >200' on level surfaces w/LRAD and SBA for improved endurance, safety w/prosthetics and strength  Baseline:  Goal status: REVISED   ASSESSMENT:  CLINICAL IMPRESSION: Focus of skilled session on having patient practice efficiently and effectively adjusting sock ply and managing limb volume for improved fit of bilateral prosthetics.  He requires 10-ply initially with left prosthesis and 8-ply on the right without the silicone inner liner for the right prosthesis as limb volume is too great at onset of session.  Upon completing SciFit pt limb volume had decreased enough he was able to maintain same ply with silicone liner donned.  Encouraged use of stepper style task in gym environment for strengthening but also fluid management when adjusting prosthetics for prolonged wear at home.  He continues to benefit from further PT interventions as he continues to need work on Geologist, engineering of gait, general fatigue, and prosthetic fit for  safety.  OBJECTIVE IMPAIRMENTS: decreased balance, decreased endurance, decreased knowledge of use of DME, decreased mobility, difficulty walking, prosthetic dependency , and obesity.   ACTIVITY LIMITATIONS: standing, squatting, and stairs  PARTICIPATION LIMITATIONS: driving and community activity  PERSONAL FACTORS: Fitness, Transportation, and 3+ comorbidities:    AAA aneurysm, anxiety, asthma, CAD, COPD, DM, DVT, HNT, migraine, morbid obesity, MI, PE, peripheral nerve disease, B BKA with prosthetics, CABG, bilateral shoulder open rotator cuff repair.are also affecting patient's functional outcome.   REHAB POTENTIAL: Good  CLINICAL DECISION MAKING: Stable/uncomplicated  EVALUATION COMPLEXITY: Moderate  PLAN:  PT FREQUENCY: 2x/week  PT DURATION: 8 weeks  PLANNED INTERVENTIONS: Therapeutic exercises, Therapeutic activity, Neuromuscular re-education, Balance training, Gait training, Patient/Family education, Self Care, Prosthetic training, DME instructions, Wheelchair mobility training, scar mobilization, Manual therapy, and Re-evaluation  PLAN FOR NEXT SESSION: Did he bring socks? (Possible +2) gait due to instability of L BKA prosthetic. Establish HEP - basic standing stuff or prone for posterior chain strength and stability. Scifit for endurance    Sadie Haber, PT, DPT  06/14/2023, 3:37 PM

## 2023-06-16 ENCOUNTER — Encounter: Payer: Self-pay | Admitting: Physical Therapy

## 2023-06-16 ENCOUNTER — Ambulatory Visit: Payer: Medicare HMO | Admitting: Physical Therapy

## 2023-06-16 VITALS — BP 107/74 | HR 83

## 2023-06-16 DIAGNOSIS — R2681 Unsteadiness on feet: Secondary | ICD-10-CM

## 2023-06-16 DIAGNOSIS — M6281 Muscle weakness (generalized): Secondary | ICD-10-CM | POA: Diagnosis not present

## 2023-06-16 DIAGNOSIS — R2689 Other abnormalities of gait and mobility: Secondary | ICD-10-CM

## 2023-06-16 NOTE — Therapy (Signed)
OUTPATIENT PHYSICAL THERAPY NEURO TREATMENT   Patient Name: Joseph [name] MRN: [id number] DOB:10-29-1971, 52 y.o., male Today's Date: 06/16/2023   PCP: Lucile Crater FNP-BC ([facility]) REFERRING PROVIDER: Carney Living, MD  END OF SESSION:  PT End of Session - 06/16/23 1409     Visit Number 5    Number of Visits 17    Date for PT Re-Evaluation 08/19/23    Authorization Type [employer]    Progress Note Due on Visit 10    PT Start Time 1406    PT Stop Time 1451    PT Time Calculation (min) 45 min    Equipment Utilized During Treatment Gait belt;Other (comment)   bilateral BKA   Activity Tolerance Patient tolerated treatment well;Patient limited by fatigue;Patient limited by pain    Behavior During Therapy [facility] for tasks assessed/performed              Past Medical History:  Diagnosis Date   Aneurysm of ascending aorta (HCC) 09/14/2021   Anxiety    Arthritis    "knees, shoulders, hips, ankles" (07/29/2016)   Asthma    Bicuspid aortic valve 09/14/2021   CAD (coronary artery disease)    a. 2017: s/p BMS to distal Cx; b. LHC 05/30/2019: 80% mid, distal RCA s/p DES, 30% narrowing of d LM, widely patent LAD w/ luminal irregularities, widely patent stent in dCX  w/ 90+% stenosis distal to stent beofre small trifurcating obtuse marginal (potentially area of restenosis)   Chronic bronchitis (HCC)    Chronic ulcer of great toe of right foot (HCC) 09/09/2017   Chronic ulcer of right great toe (HCC) 09/19/2017   COPD (chronic obstructive pulmonary disease) (HCC)    Coronary arteriosclerosis    Depression    Diabetes mellitus (HCC)    DVT (deep venous thrombosis) (HCC)    GERD (gastroesophageal reflux disease)    Hyperlipidemia    Hypertension    Migraine    Morbid obesity (HCC)    Myocardial infarction (HCC) 1996   "light one"   PE (pulmonary embolism) 04/2013   On chronic Xarelto   Peripheral nerve disease    Pneumonia "several times"   Sleep apnea     Type II diabetes mellitus (HCC)    Past Surgical History:  Procedure Laterality Date   ABDOMINAL AORTOGRAM W/LOWER EXTREMITY N/A 10/10/2020   Procedure: ABDOMINAL AORTOGRAM W/LOWER EXTREMITY;  Surgeon: Sherren Kerns, MD;  [facility]: [facility];  Service: Cardiovascular;  Laterality: N/A;   AMPUTATION Right 09/21/2017   Procedure: RIGHT GREAT TOE AMPUTATION, POSSIBLE VAC;  Surgeon: Tarry Kos, MD;  Location: [facility] OR;  Service: Orthopedics;  Laterality: Right;   AMPUTATION Left 08/13/2020   Procedure: LEFT FOOT 4TH RAY AMPUTATION;  Surgeon: [name], MD;  Location: [facility] OR;  Service: Orthopedics;  Laterality: Left;   AMPUTATION Left 11/20/2021   Procedure: LEFT TRANSMETATARSAL AMPUTATION;  Surgeon: [name], MD;  Location: [facility] OR;  Service: Orthopedics;  Laterality: Left;   AMPUTATION Left 01/08/2022   Procedure: LEFT BELOW KNEE AMPUTATION;  Surgeon: [name], MD;  Location: [facility] OR;  Service: Orthopedics;  Laterality: Left;   AMPUTATION Right 07/07/2022   Procedure: RIGHT BELOW KNEE AMPUTATION;  Surgeon: [name], MD;  Location: [facility] OR;  Service: Orthopedics;  Laterality: Right;   AORTOGRAM Bilateral 03/13/2021   Procedure: ABDOMINAL AORTOGRAM WITH Left LOWER EXTREMITY RUNOFF;  Surgeon: Leonie Douglas, MD;  Location: [facility] OR;  Service: Vascular;  Laterality:  Bilateral;   APPLICATION OF WOUND VAC  01/08/2022   Procedure: APPLICATION OF WOUND VAC;  Surgeon: [name], MD;  Location: [facility] OR;  Service: Orthopedics;;   APPLICATION OF WOUND VAC Right 07/07/2022   Procedure: APPLICATION OF WOUND VAC;  Surgeon: [name], MD;  Location: [facility] OR;  Service: Orthopedics;  Laterality: Right;   CARDIAC CATHETERIZATION  2006   CARDIAC CATHETERIZATION  1996   "@ [facility]; when I had my heart attack"   CARDIAC CATHETERIZATION N/A 07/29/2016   Procedure: Left Heart Cath and Coronary Angiography;  Surgeon: Runell Gess, MD;  Location: [facility] INVASIVE CV LAB;  Service: Cardiovascular;   Laterality: N/A;   CARDIAC CATHETERIZATION N/A 07/29/2016   Procedure: Coronary Stent Intervention;  Surgeon: Runell Gess, MD;  Location: [facility];  Service: Cardiovascular;  Laterality: N/A;   CARPAL TUNNEL RELEASE Bilateral    CORONARY ANGIOPLASTY WITH STENT PLACEMENT  07/29/2016   CORONARY ARTERY BYPASS GRAFT N/A 07/24/2020   Procedure: CORONARY ARTERY BYPASS GRAFTING (CABG), ON PUMP, TIMES FOUR, USING LEFT INTERNAL MAMMARY ARTERY AND ENDOSCOPICALLY HARVESTED RIGHT GREATER SAPHENOUS VEIN;  Surgeon: Corliss Skains, MD;  Location: [facility] OR;  Service: Open Heart Surgery;  Laterality: N/A;  FLOW TAC   CORONARY PRESSURE/FFR STUDY N/A 07/22/2020   Procedure: INTRAVASCULAR PRESSURE WIRE/FFR STUDY;  Surgeon: Marykay Lex, MD;  Location: [facility] INVASIVE CV LAB;  Service: Cardiovascular;  Laterality: N/A;   CORONARY STENT INTERVENTION N/A 05/30/2019   Procedure: CORONARY STENT INTERVENTION;  Surgeon: Lyn Records, MD;  [facility]: [facility];  Service: Cardiovascular;  Laterality: N/A;   CORONARY STENT INTERVENTION N/A 11/02/2019   Procedure: CORONARY STENT INTERVENTION;  Surgeon: Iran Ouch, MD;  [facility]: [facility];  Service: Cardiovascular;  Laterality: N/A;   ESOPHAGOGASTRODUODENOSCOPY N/A 09/22/2017   Procedure: ESOPHAGOGASTRODUODENOSCOPY (EGD);  Surgeon: Meryl Dare, MD;  Location: [facility] ENDOSCOPY;  Service: Endoscopy;  Laterality: N/A;   KNEE ARTHROSCOPY Bilateral    "2 on left; 1 on the right"   LEFT HEART CATH AND CORONARY ANGIOGRAPHY N/A 11/02/2019   Procedure: LEFT HEART CATH AND CORONARY ANGIOGRAPHY;  Surgeon: Iran Ouch, MD;  [facility]: [facility];  Service: Cardiovascular;  Laterality: N/A;   LEFT HEART CATH AND CORONARY ANGIOGRAPHY N/A 07/22/2020   Procedure: LEFT HEART CATH AND CORONARY ANGIOGRAPHY;  Surgeon: Marykay Lex, MD;  Location: [facility] INVASIVE CV LAB;  Service: Cardiovascular;  Laterality: N/A;   LEFT HEART CATH AND CORS/GRAFTS  ANGIOGRAPHY N/A 08/17/2021   Procedure: LEFT HEART CATH AND CORS/GRAFTS ANGIOGRAPHY;  Surgeon: Yvonne Kendall, MD;  Location: [facility];  Service: Cardiovascular;  Laterality: N/A;   PERIPHERAL VASCULAR INTERVENTION Left 10/11/2020   popliteal and SFA stent placement    PERIPHERAL VASCULAR INTERVENTION Left 10/10/2020   Procedure: PERIPHERAL VASCULAR INTERVENTION;  Surgeon: Sherren Kerns, MD;  [facility]: [facility];  Service: Cardiovascular;  Laterality: Left;   RIGHT/LEFT HEART CATH AND CORONARY ANGIOGRAPHY N/A 05/30/2019   Procedure: RIGHT/LEFT HEART CATH AND CORONARY ANGIOGRAPHY;  Surgeon: Lyn Records, MD;  [facility]: [facility];  Service: Cardiovascular;  Laterality: N/A;   RIGHT/LEFT HEART CATH AND CORONARY/GRAFT ANGIOGRAPHY N/A 06/17/2022   Procedure: RIGHT/LEFT HEART CATH AND CORONARY/GRAFT ANGIOGRAPHY;  Surgeon: Lennette Bihari, MD;  Location: [facility];  Service: Cardiovascular;  Laterality: N/A;   SHOULDER OPEN ROTATOR CUFF REPAIR Bilateral    TEE WITHOUT CARDIOVERSION N/A 07/24/2020   Procedure:  TRANSESOPHAGEAL ECHOCARDIOGRAM (TEE);  Surgeon: Corliss Skains, MD;  Location: [facility] OR;  Service: Open Heart Surgery;  Laterality: N/A;   Patient Active Problem List   Diagnosis Date Noted   AKI (acute kidney injury) (HCC) 04/29/2023   Weakness 04/28/2023   Nephrosis 04/27/2023   CHF (congestive heart failure) (HCC) 04/26/2023   OSA (obstructive sleep apnea) 04/26/2023   Preoperative cardiovascular examination 04/25/2023   Hx of iron deficiency anemia 07/07/2022   Syncope and collapse 06/15/2022   AMS (altered mental status) 04/14/2022   Acute respiratory failure with hypoxia (HCC) 04/14/2022   Acute metabolic encephalopathy 04/14/2022   Pneumonia 04/14/2022   Ulcer of right foot, limited to breakdown of skin (HCC)    Below-knee amputation of left lower extremity (HCC)    Osteomyelitis of foot, right, acute (HCC) 01/06/2022   Bipolar disorder  with depression (HCC)    Sepsis (HCC) 11/18/2021   Chest pain 11/18/2021   Dyspnea 11/18/2021   Wound infection    Ulcer of left foot with necrosis of bone (HCC)    Bicuspid aortic valve 09/14/2021   Aneurysm of ascending aorta (HCC) 09/14/2021   DM (diabetes mellitus) with peripheral vascular complication (HCC) 08/20/2021   Atypical chest pain 07/17/2021   Hyponatremia 07/17/2021   Leg wound, left 07/17/2021   Essential hypertension 07/17/2021   Acute hyperglycemia 07/09/2021   Non-ST elevation (NSTEMI) myocardial infarction (HCC) 07/22/2020   Heart failure with preserved ejection fraction (HCC) 07/22/2020   Hypotension 07/18/2020   Elevated troponin 07/18/2020   Acute renal failure superimposed on stage 3a chronic kidney disease (HCC) 07/18/2020   Elevated brain natriuretic peptide (BNP) level 07/18/2020   Edema 06/13/2019   Leukocytosis 05/31/2019   Smoker 03/08/2019   Chronic back pain 02/16/2019   Deep venous thrombosis (HCC) 02/16/2019   Obesity, Class III, BMI 40-49.9 (morbid obesity) (HCC) 02/16/2019   Peripheral nerve disease 02/16/2019   COPD (chronic obstructive pulmonary disease) (HCC) 01/22/2019   Anxiety and depression 03/26/2018   Sinusitis 10/12/2017   Abdominal pain    Loss of weight    Diabetic foot infection (HCC) 09/16/2017   Cellulitis of right foot    Recurrent chest pain 07/29/2016   Hyperglycemia    Insulin dependent type 2 diabetes mellitus (HCC) 04/29/2015   History of pulmonary embolus (PE) 04/27/2013   CAD (coronary artery disease) 09/10/2008    ONSET DATE: 04/27/2023  REFERRING DIAG: R53.1 (ICD-10-CM) - Weakness  THERAPY DIAG:  Muscle weakness (generalized)  Other abnormalities of gait and mobility  Unsteadiness on feet  Rationale for Evaluation and Treatment: Rehabilitation  SUBJECTIVE:  SUBJECTIVE STATEMENT: Patient reports pain in bilateral shoulder pain, knee pain, hip pain, and bilateral down into the ankles. Patient denies any falls/near falls since he was last here. Patient reports he is wearing a 10 ply on his L and 9 ply on his R.   Pt accompanied by: self  PERTINENT HISTORY:  PMH signficant for AAA aneurysm, anxiety, asthma, CAD, COPD, DM, DVT, HNT, migraine, morbid obesity, MI, PE, peripheral nerve disease, B BKA with prosthetics, CABG, bilateral shoulder open rotator cuff repair.  PAIN:  Are you having pain? Yes: NPRS scale: 37/10 Pain location: Bilateral shoulders and hips are worst, total body Pain description: achy  PRECAUTIONS: Fall and Other: B BKA  WEIGHT BEARING RESTRICTIONS: No  FALLS: Has patient fallen in last 6 months? Yes. Number of falls 2-3; per pt was not injured during fall; per pt he can get back up by himself  LIVING ENVIRONMENT: Lives with: lives with their spouse Lives in: House/apartment Stairs: No Has following equipment at home: Environmental consultant - 2 wheeled, Wheelchair (power), Wheelchair (manual), and Ramped entry  PLOF: Independent with gait, Independent with transfers, and Requires assistive device for independence  PATIENT GOALS: "to walk"  OBJECTIVE:   DIAGNOSTIC FINDINGS:  None relevant to this POC  COGNITION: Overall cognitive status:  decreased memory per patient report   SENSATION: Impaired due to neuropathy in residual limbs  POSTURE: rounded shoulders, forward head, and posterior pelvic tilt  LOWER EXTREMITY ROM:   *not assessed at eval  Active  Right Eval Left Eval  Hip flexion    Hip extension    Hip abduction    Hip adduction    Hip internal rotation    Hip external rotation    Knee flexion    Knee extension    Ankle dorsiflexion    Ankle plantarflexion    Ankle inversion    Ankle eversion     (Blank rows = not tested)  LOWER EXTREMITY MMT:  *not assessed at  eval  MMT Right Eval Left Eval  Hip flexion    Hip extension    Hip abduction    Hip adduction    Hip internal rotation    Hip external rotation    Knee flexion    Knee extension    Ankle dorsiflexion    Ankle plantarflexion    Ankle inversion    Ankle eversion    (Blank rows = not tested)  VITALS  Vitals:   06/16/23 1416  BP: 107/74  Pulse: 83     TODAY'S TREATMENT:       TherEx: - Progressive Hill climb at level 5 on SciFit x 8 minute for fluid maintnece, LE strengthening and ROM, and reciprocal arm movements (steps/min averaging around 80-87 steps minute)  - drops L arm intermittently due to pain   - Total steps: 613 steps  - RPE: 6/10  NMR: - Reviewed sink HEP during session performed 1-2x each exercise (SBA with mix of CGA) performed primarily with mix of double/single UE support during today's session but did well with trials of without UE support Side to Side Shift Front to Back Shift Moving Cones / Cups: Overhead/Upward Reaching: 5.   Looking Over Shoulders:  6.  Stepping     PATIENT EDUCATION: Education details: Therapist to follow up about pool interest but stated not to her knowledge but would confirm + Initial HEP Person educated: Patient Education method: Explanation and Handouts Education comprehension: verbalized understanding and needs further education  HOME EXERCISE PROGRAM: To  be initiated  GOALS: Goals reviewed with patient? Yes  SHORT TERM GOALS: Target date: 06/25/2023  Pt will be independent with initial HEP for improved strength, balance, transfers and gait. Baseline: Goal status: INITIAL  2.  Pt will perform sit to stand transfers w/SBA and LRAD for improved independence and BLE strength Baseline: CGA Goal status: REVISED  3. Pt will ascend/descend ramp w/LRAD and CGA for improved safety w/household ambulation  Baseline: not assessed - pt has video of ramp navigation using rails Goal status: REVISED  4.  Pt to demonstrate  compliance with BLE prosthetic wear schedule and residual limb skin checks as well as residual limb measurement  Baseline:  Goal status: REVISED   LONG TERM GOALS: Target date: 07/23/2023  Pt will be independent with final HEP for improved strength, balance, transfers and gait. Baseline:  Goal status: INITIAL  2.  Pt will be mod I w/ all transfers w/LRAD for improved independence and safety w/prosthetics  Baseline: CGA Goal status: REVISED  3.  Pt will ambulate >200' on level surfaces w/LRAD and SBA for improved endurance, safety w/prosthetics and strength  Baseline:  Goal status: REVISED   ASSESSMENT:  CLINICAL IMPRESSION: Session spent addressing work on establishing an initial HEP for patient with warmup on SciFit to continue to progress ROM/strength. Patient tolerated sink HEP well. Patient expresses strong interest in getting in the pool. Therapist states she is not familiar of any places in the area but would also follow up with aquatic physical therapists for second opinion. Continue POC.  OBJECTIVE IMPAIRMENTS: decreased balance, decreased endurance, decreased knowledge of use of DME, decreased mobility, difficulty walking, prosthetic dependency , and obesity.   ACTIVITY LIMITATIONS: standing, squatting, and stairs  PARTICIPATION LIMITATIONS: driving and community activity  PERSONAL FACTORS: Fitness, Transportation, and 3+ comorbidities:    AAA aneurysm, anxiety, asthma, CAD, COPD, DM, DVT, HNT, migraine, morbid obesity, MI, PE, peripheral nerve disease, B BKA with prosthetics, CABG, bilateral shoulder open rotator cuff repair.are also affecting patient's functional outcome.   REHAB POTENTIAL: Good  CLINICAL DECISION MAKING: Stable/uncomplicated  EVALUATION COMPLEXITY: Moderate  PLAN:  PT FREQUENCY: 2x/week  PT DURATION: 8 weeks  PLANNED INTERVENTIONS: Therapeutic exercises, Therapeutic activity, Neuromuscular re-education, Balance training, Gait training,  Patient/Family education, Self Care, Prosthetic training, DME instructions, Wheelchair mobility training, scar mobilization, Manual therapy, and Re-evaluation  PLAN FOR NEXT SESSION: Did he bring socks? (Possible +2) gait due to instability of L BKA prosthetic. Scifit for endurance, follow up about pool, gait training and activities working on balance    Carmelia Bake, PT, DPT  06/16/2023, 6:28 PM

## 2023-06-21 ENCOUNTER — Ambulatory Visit: Payer: Medicare HMO | Admitting: Physical Therapy

## 2023-06-24 ENCOUNTER — Ambulatory Visit: Payer: Medicare HMO | Attending: Family Medicine | Admitting: Physical Therapy

## 2023-06-24 DIAGNOSIS — R2689 Other abnormalities of gait and mobility: Secondary | ICD-10-CM | POA: Insufficient documentation

## 2023-06-24 DIAGNOSIS — R2681 Unsteadiness on feet: Secondary | ICD-10-CM | POA: Insufficient documentation

## 2023-06-24 DIAGNOSIS — M6281 Muscle weakness (generalized): Secondary | ICD-10-CM | POA: Insufficient documentation

## 2023-06-24 NOTE — Therapy (Addendum)
OUTPATIENT PHYSICAL THERAPY NEURO TREATMENT   Patient Name: Joseph [name] MRN: [id number] DOB:1971-10-11, 52 y.o., male Today's Date: 06/24/2023   PCP: Lucile Crater FNP-BC ([facility]) REFERRING PROVIDER: Carney Living, MD  END OF SESSION:  PT End of Session - 06/24/23 1324     Visit Number 6    Number of Visits 17    Date for PT Re-Evaluation 08/19/23    Authorization Type [employer]    Progress Note Due on Visit 10    PT Start Time 1321    PT Stop Time 1421    PT Time Calculation (min) 60 min    Equipment Utilized During Treatment Gait belt;Other (comment)   bilateral BKA   Activity Tolerance Patient tolerated treatment well    Behavior During Therapy WFL for tasks assessed/performed               Past Medical History:  Diagnosis Date   Aneurysm of ascending aorta (HCC) 09/14/2021   Anxiety    Arthritis    "knees, shoulders, hips, ankles" (07/29/2016)   Asthma    Bicuspid aortic valve 09/14/2021   CAD (coronary artery disease)    a. 2017: s/p BMS to distal Cx; b. LHC 05/30/2019: 80% mid, distal RCA s/p DES, 30% narrowing of d LM, widely patent LAD w/ luminal irregularities, widely patent stent in dCX  w/ 90+% stenosis distal to stent beofre small trifurcating obtuse marginal (potentially area of restenosis)   Chronic bronchitis (HCC)    Chronic ulcer of great toe of right foot (HCC) 09/09/2017   Chronic ulcer of right great toe (HCC) 09/19/2017   COPD (chronic obstructive pulmonary disease) (HCC)    Coronary arteriosclerosis    Depression    Diabetes mellitus (HCC)    DVT (deep venous thrombosis) (HCC)    GERD (gastroesophageal reflux disease)    Hyperlipidemia    Hypertension    Migraine    Morbid obesity (HCC)    Myocardial infarction (HCC) 1996   "light one"   PE (pulmonary embolism) 04/2013   On chronic Xarelto   Peripheral nerve disease    Pneumonia "several times"   Sleep apnea    Type II diabetes mellitus (HCC)    Past Surgical  History:  Procedure Laterality Date   ABDOMINAL AORTOGRAM W/LOWER EXTREMITY N/A 10/10/2020   Procedure: ABDOMINAL AORTOGRAM W/LOWER EXTREMITY;  Surgeon: Sherren Kerns, MD;  Location: [facility];  Service: Cardiovascular;  Laterality: N/A;   AMPUTATION Right 09/21/2017   Procedure: RIGHT GREAT TOE AMPUTATION, POSSIBLE VAC;  Surgeon: Tarry Kos, MD;  Location: [facility] OR;  Service: Orthopedics;  Laterality: Right;   AMPUTATION Left 08/13/2020   Procedure: LEFT FOOT 4TH RAY AMPUTATION;  Surgeon: [name], MD;  Location: [facility] OR;  Service: Orthopedics;  Laterality: Left;   AMPUTATION Left 11/20/2021   Procedure: LEFT TRANSMETATARSAL AMPUTATION;  Surgeon: [name], MD;  Location: [facility] OR;  Service: Orthopedics;  Laterality: Left;   AMPUTATION Left 01/08/2022   Procedure: LEFT BELOW KNEE AMPUTATION;  Surgeon: [name], MD;  Location: [facility] OR;  Service: Orthopedics;  Laterality: Left;   AMPUTATION Right 07/07/2022   Procedure: RIGHT BELOW KNEE AMPUTATION;  Surgeon: [name], MD;  Location: [facility] OR;  Service: Orthopedics;  Laterality: Right;   AORTOGRAM Bilateral 03/13/2021   Procedure: ABDOMINAL AORTOGRAM WITH Left LOWER EXTREMITY RUNOFF;  Surgeon: Leonie Douglas, MD;  Location: [facility] OR;  Service: Vascular;  Laterality: Bilateral;   APPLICATION OF  WOUND VAC  01/08/2022   Procedure: APPLICATION OF WOUND VAC;  Surgeon: [name], MD;  Location: [facility] OR;  Service: Orthopedics;;   APPLICATION OF WOUND VAC Right 07/07/2022   Procedure: APPLICATION OF WOUND VAC;  Surgeon: [name], MD;  Location: [facility] OR;  Service: Orthopedics;  Laterality: Right;   CARDIAC CATHETERIZATION  2006   CARDIAC CATHETERIZATION  1996   "@ [facility]; when I had my heart attack"   CARDIAC CATHETERIZATION N/A 07/29/2016   Procedure: Left Heart Cath and Coronary Angiography;  Surgeon: Runell Gess, MD;  Location: [facility] INVASIVE CV LAB;  Service: Cardiovascular;  Laterality: N/A;   CARDIAC CATHETERIZATION N/A  07/29/2016   Procedure: Coronary Stent Intervention;  Surgeon: Runell Gess, MD;  Location: [facility];  Service: Cardiovascular;  Laterality: N/A;   CARPAL TUNNEL RELEASE Bilateral    CORONARY ANGIOPLASTY WITH STENT PLACEMENT  07/29/2016   CORONARY ARTERY BYPASS GRAFT N/A 07/24/2020   Procedure: CORONARY ARTERY BYPASS GRAFTING (CABG), ON PUMP, TIMES FOUR, USING LEFT INTERNAL MAMMARY ARTERY AND ENDOSCOPICALLY HARVESTED RIGHT GREATER SAPHENOUS VEIN;  Surgeon: Corliss Skains, MD;  Location: [facility] OR;  Service: Open Heart Surgery;  Laterality: N/A;  FLOW TAC   CORONARY PRESSURE/FFR STUDY N/A 07/22/2020   Procedure: INTRAVASCULAR PRESSURE WIRE/FFR STUDY;  Surgeon: Marykay Lex, MD;  Location: [facility] INVASIVE CV LAB;  Service: Cardiovascular;  Laterality: N/A;   CORONARY STENT INTERVENTION N/A 05/30/2019   Procedure: CORONARY STENT INTERVENTION;  Surgeon: Lyn Records, MD;  Location: [facility];  Service: Cardiovascular;  Laterality: N/A;   CORONARY STENT INTERVENTION N/A 11/02/2019   Procedure: CORONARY STENT INTERVENTION;  Surgeon: Iran Ouch, MD;  Location: [facility];  Service: Cardiovascular;  Laterality: N/A;   ESOPHAGOGASTRODUODENOSCOPY N/A 09/22/2017   Procedure: ESOPHAGOGASTRODUODENOSCOPY (EGD);  Surgeon: Meryl Dare, MD;  Location: [facility] ENDOSCOPY;  Service: Endoscopy;  Laterality: N/A;   KNEE ARTHROSCOPY Bilateral    "2 on left; 1 on the right"   LEFT HEART CATH AND CORONARY ANGIOGRAPHY N/A 11/02/2019   Procedure: LEFT HEART CATH AND CORONARY ANGIOGRAPHY;  Surgeon: Iran Ouch, MD;  Location: [facility];  Service: Cardiovascular;  Laterality: N/A;   LEFT HEART CATH AND CORONARY ANGIOGRAPHY N/A 07/22/2020   Procedure: LEFT HEART CATH AND CORONARY ANGIOGRAPHY;  Surgeon: Marykay Lex, MD;  Location: [facility] INVASIVE CV LAB;  Service: Cardiovascular;  Laterality: N/A;   LEFT HEART CATH AND CORS/GRAFTS ANGIOGRAPHY N/A 08/17/2021   Procedure: LEFT  HEART CATH AND CORS/GRAFTS ANGIOGRAPHY;  Surgeon: Yvonne Kendall, MD;  Location: [facility];  Service: Cardiovascular;  Laterality: N/A;   PERIPHERAL VASCULAR INTERVENTION Left 10/11/2020   popliteal and SFA stent placement    PERIPHERAL VASCULAR INTERVENTION Left 10/10/2020   Procedure: PERIPHERAL VASCULAR INTERVENTION;  Surgeon: Sherren Kerns, MD;  Location: [facility];  Service: Cardiovascular;  Laterality: Left;   RIGHT/LEFT HEART CATH AND CORONARY ANGIOGRAPHY N/A 05/30/2019   Procedure: RIGHT/LEFT HEART CATH AND CORONARY ANGIOGRAPHY;  Surgeon: Lyn Records, MD;  Location: [facility];  Service: Cardiovascular;  Laterality: N/A;   RIGHT/LEFT HEART CATH AND CORONARY/GRAFT ANGIOGRAPHY N/A 06/17/2022   Procedure: RIGHT/LEFT HEART CATH AND CORONARY/GRAFT ANGIOGRAPHY;  Surgeon: Lennette Bihari, MD;  Location: [facility];  Service: Cardiovascular;  Laterality: N/A;   SHOULDER OPEN ROTATOR CUFF REPAIR Bilateral    TEE WITHOUT CARDIOVERSION N/A 07/24/2020   Procedure: TRANSESOPHAGEAL ECHOCARDIOGRAM (TEE);  Surgeon:  Corliss Skains, MD;  Location: [facility] OR;  Service: Open Heart Surgery;  Laterality: N/A;   Patient Active Problem List   Diagnosis Date Noted   AKI (acute kidney injury) (HCC) 04/29/2023   Weakness 04/28/2023   Nephrosis 04/27/2023   CHF (congestive heart failure) (HCC) 04/26/2023   OSA (obstructive sleep apnea) 04/26/2023   Preoperative cardiovascular examination 04/25/2023   Hx of iron deficiency anemia 07/07/2022   Syncope and collapse 06/15/2022   AMS (altered mental status) 04/14/2022   Acute respiratory failure with hypoxia (HCC) 04/14/2022   Acute metabolic encephalopathy 04/14/2022   Pneumonia 04/14/2022   Ulcer of right foot, limited to breakdown of skin (HCC)    Below-knee amputation of left lower extremity (HCC)    Osteomyelitis of foot, right, acute (HCC) 01/06/2022   Bipolar disorder with depression (HCC)    Sepsis (HCC)  11/18/2021   Chest pain 11/18/2021   Dyspnea 11/18/2021   Wound infection    Ulcer of left foot with necrosis of bone (HCC)    Bicuspid aortic valve 09/14/2021   Aneurysm of ascending aorta (HCC) 09/14/2021   DM (diabetes mellitus) with peripheral vascular complication (HCC) 08/20/2021   Atypical chest pain 07/17/2021   Hyponatremia 07/17/2021   Leg wound, left 07/17/2021   Essential hypertension 07/17/2021   Acute hyperglycemia 07/09/2021   Non-ST elevation (NSTEMI) myocardial infarction (HCC) 07/22/2020   Heart failure with preserved ejection fraction (HCC) 07/22/2020   Hypotension 07/18/2020   Elevated troponin 07/18/2020   Acute renal failure superimposed on stage 3a chronic kidney disease (HCC) 07/18/2020   Elevated brain natriuretic peptide (BNP) level 07/18/2020   Edema 06/13/2019   Leukocytosis 05/31/2019   Smoker 03/08/2019   Chronic back pain 02/16/2019   Deep venous thrombosis (HCC) 02/16/2019   Obesity, Class III, BMI 40-49.9 (morbid obesity) (HCC) 02/16/2019   Peripheral nerve disease 02/16/2019   COPD (chronic obstructive pulmonary disease) (HCC) 01/22/2019   Anxiety and depression 03/26/2018   Sinusitis 10/12/2017   Abdominal pain    Loss of weight    Diabetic foot infection (HCC) 09/16/2017   Cellulitis of right foot    Recurrent chest pain 07/29/2016   Hyperglycemia    Insulin dependent type 2 diabetes mellitus (HCC) 04/29/2015   History of pulmonary embolus (PE) 04/27/2013   CAD (coronary artery disease) 09/10/2008    ONSET DATE: 04/27/2023  REFERRING DIAG: R53.1 (ICD-10-CM) - Weakness  THERAPY DIAG:  Muscle weakness (generalized)  Other abnormalities of gait and mobility  Unsteadiness on feet  Rationale for Evaluation and Treatment: Rehabilitation  SUBJECTIVE:  SUBJECTIVE STATEMENT: Patient reports pain in bilateral shoulder pain, knee pain, hip pain, and bilateral down into the ankles. Pt states he has had one fall since last visit, fell out of the bed while trying to transfer to chair. No injuries, was able to get up on his own. States he has not been able to do his HEP recently due to nausea, rating his nausea today as 5/10. Has 10 ply socks on LLE and 9 ply on RLE. Sees [facility] on 7/10, having a lot of pain in R residual limb.    Pt accompanied by: self  PERTINENT HISTORY:  PMH signficant for AAA aneurysm, anxiety, asthma, CAD, COPD, DM, DVT, HNT, migraine, morbid obesity, MI, PE, peripheral nerve disease, B BKA with prosthetics, CABG, bilateral shoulder open rotator cuff repair.  PAIN:  Are you having pain? Yes: NPRS scale: 37/10 Pain location: Bilateral shoulders and hips are worst, total body Pain description: achy  PRECAUTIONS: Fall and Other: B BKA  WEIGHT BEARING RESTRICTIONS: No  FALLS: Has patient fallen in last 6 months? Yes. Number of falls 2-3; per pt was not injured during fall; per pt he can get back up by himself  LIVING ENVIRONMENT: Lives with: lives with their spouse Lives in: House/apartment Stairs: No Has following equipment at home: Environmental consultant - 2 wheeled, Wheelchair (power), Wheelchair (manual), and Ramped entry  PLOF: Independent with gait, Independent with transfers, and Requires assistive device for independence  PATIENT GOALS: "to walk"  OBJECTIVE:   DIAGNOSTIC FINDINGS:  None relevant to this POC  COGNITION: Overall cognitive status:  decreased memory per patient report   SENSATION: Impaired due to neuropathy in residual limbs  POSTURE: rounded shoulders, forward head, and posterior pelvic tilt  LOWER EXTREMITY ROM:   *not assessed at eval  Active  Right Eval Left Eval  Hip flexion    Hip extension    Hip abduction    Hip adduction    Hip internal rotation    Hip external rotation    Knee  flexion    Knee extension    Ankle dorsiflexion    Ankle plantarflexion    Ankle inversion    Ankle eversion     (Blank rows = not tested)  LOWER EXTREMITY MMT:  *not assessed at eval  MMT Right Eval Left Eval  Hip flexion    Hip extension    Hip abduction    Hip adduction    Hip internal rotation    Hip external rotation    Knee flexion    Knee extension    Ankle dorsiflexion    Ankle plantarflexion    Ankle inversion    Ankle eversion    (Blank rows = not tested)  VITALS  There were no vitals filed for this visit.    TODAY'S TREATMENT:      Ther Act  Lengthy discussion regarding pt's next [facility] visit and needing prosthetist to make adjustments to bilateral sockets. L socket is too large and pt reports significant pain in RLE due to "bottoming out" and feeling the "sharpness" of his tibia w/weightbearing.  Assessed pt's R residual limb and pt has small cut on distal limb that was bleeding. Provided first aid to cut and covered with a bandaid. Educated pt on performing regular skin checks and reducing prosthetic wear time on RLE due to new cut. Pt verbalized understanding.  Assisted pt in applying more socks to limbs and pt wearing 30 ply on LLE and 10 ply on RLE w/room to add  more.  Following gait training, therapist provided pt w/letter to give to prosthetist:   Joseph [name],   Joseph [name] required 30 ply socks on his left leg in order to safely ambulate in therapy and it was still too large. I know his original socket is too small for his leg currently and he needs room to accommodate for weight fluctuations, but the current size of his prosthetic is unsafe for him. He demonstrates genu valgus on the right and varus on the left as well. I am hoping there are some modifications that can be made in order to improve his stability.   Gait Training   Gait pattern:  genu valgus of RLE and genu varus of LLE, step through pattern, trunk flexed, and wide BOS Distance walked:  115' Assistive device utilized: Environmental consultant - 2 wheeled Level of assistance: SBA Comments: Per pt request, recorded pt walking to show prosthetist at [facility] and noted continued excessive IR of RLE despite adding socks and significant genu valgus of RLE and varus of LLE    PATIENT EDUCATION: Education details: Continue HEP, letter to give prosthetist  Person educated: Patient Education method: Explanation and Handouts Education comprehension: verbalized understanding and needs further education  HOME EXERCISE PROGRAM: To be initiated  GOALS: Goals reviewed with patient? Yes  SHORT TERM GOALS: Target date: 06/25/2023  Pt will be independent with initial HEP for improved strength, balance, transfers and gait. Baseline: Goal status: IN PROGRESS  2.  Pt will perform sit to stand transfers w/SBA and LRAD for improved independence and BLE strength Baseline: CGA Goal status: MET  3. Pt will ascend/descend ramp w/LRAD and CGA for improved safety w/household ambulation  Baseline: not assessed - pt has video of ramp navigation using rails Goal status: IN PROGRESS  4.  Pt to demonstrate compliance with BLE prosthetic wear schedule and residual limb skin checks as well as residual limb measurement  Baseline: provided education on 7/5 Goal status: IN PROGRESS   LONG TERM GOALS: Target date: 07/23/2023  Pt will be independent with final HEP for improved strength, balance, transfers and gait. Baseline:  Goal status: INITIAL  2.  Pt will be mod I w/ all transfers w/LRAD for improved independence and safety w/prosthetics  Baseline: CGA Goal status: REVISED  3.  Pt will ambulate >200' on level surfaces w/LRAD and SBA for improved endurance, safety w/prosthetics and strength  Baseline:  Goal status: REVISED   ASSESSMENT:  CLINICAL IMPRESSION: Emphasis of skilled PT session on pt education and gait training. Pt required 30 ply socks to safely ambulate w/L BKA prosthetic this date and reports  significant pain in R prosthetic due to "bottoming out". Pt has very small cut on distal end of R residual limb, so educated pt on importance of performing regular skin checks as he performs transfers at home without prosthetics donned. Pt to see prosthetist on 7/10, so provided pt w/letter to provide prosthetist regarding therapist's concerns. Pt has met 1 STG and is progressing the remaining 3, performing sit to stands w/SBA. Pt has been unable to perform HEP due to nausea and pain in residual limbs and did not assess ramp goal this date. Pt reports his transportation service informed him that he is out of rides after next week, so will need to find new transportation method. Continue POC.   OBJECTIVE IMPAIRMENTS: decreased balance, decreased endurance, decreased knowledge of use of DME, decreased mobility, difficulty walking, prosthetic dependency , and obesity.   ACTIVITY LIMITATIONS: standing, squatting, and stairs  PARTICIPATION LIMITATIONS: driving and community activity  PERSONAL FACTORS: Fitness, Transportation, and 3+ comorbidities:    AAA aneurysm, anxiety, asthma, CAD, COPD, DM, DVT, HNT, migraine, morbid obesity, MI, PE, peripheral nerve disease, B BKA with prosthetics, CABG, bilateral shoulder open rotator cuff repair.are also affecting patient's functional outcome.   REHAB POTENTIAL: Good  CLINICAL DECISION MAKING: Stable/uncomplicated  EVALUATION COMPLEXITY: Moderate  PLAN:  PT FREQUENCY: 2x/week  PT DURATION: 8 weeks  PLANNED INTERVENTIONS: Therapeutic exercises, Therapeutic activity, Neuromuscular re-education, Balance training, Gait training, Patient/Family education, Self Care, Prosthetic training, DME instructions, Wheelchair mobility training, scar mobilization, Manual therapy, and Re-evaluation  PLAN FOR NEXT SESSION: Finish goal assessment. Can you addend my letter to [facility] (I got varus and valgus sides mixed up lol)? Did he bring socks? gait due to instability of L  BKA prosthetic. Scifit for endurance, gait training and activities working on balance    Joseph [name], PT, DPT  06/24/2023, 2:22 PM

## 2023-06-28 ENCOUNTER — Ambulatory Visit: Payer: Medicare HMO | Admitting: Physical Therapy

## 2023-06-28 ENCOUNTER — Encounter: Payer: Self-pay | Admitting: Physical Therapy

## 2023-06-28 VITALS — BP 131/91 | HR 92

## 2023-06-28 DIAGNOSIS — M6281 Muscle weakness (generalized): Secondary | ICD-10-CM | POA: Diagnosis not present

## 2023-06-28 DIAGNOSIS — R2689 Other abnormalities of gait and mobility: Secondary | ICD-10-CM

## 2023-06-28 DIAGNOSIS — R2681 Unsteadiness on feet: Secondary | ICD-10-CM

## 2023-06-28 NOTE — Therapy (Signed)
OUTPATIENT PHYSICAL THERAPY NEURO TREATMENT   Patient Name: Joseph [name] MRN: [id number] DOB:10/20/1971, 52 y.o., male Today's Date: 06/28/2023   PCP: Lucile Crater FNP-BC ([facility]) REFERRING PROVIDER: Carney Living, MD  END OF SESSION:  PT End of Session - 06/28/23 1410     Visit Number 7    Number of Visits 17    Date for PT Re-Evaluation 08/19/23    Authorization Type [employer]    Progress Note Due on Visit 10    PT Start Time 1402    PT Stop Time 1451    PT Time Calculation (min) 49 min    Equipment Utilized During Treatment Gait belt;Other (comment)    Activity Tolerance Patient tolerated treatment well    Behavior During Therapy [facility] for tasks assessed/performed               Past Medical History:  Diagnosis Date   Aneurysm of ascending aorta (HCC) 09/14/2021   Anxiety    Arthritis    "knees, shoulders, hips, ankles" (07/29/2016)   Asthma    Bicuspid aortic valve 09/14/2021   CAD (coronary artery disease)    a. 2017: s/p BMS to distal Cx; b. LHC 05/30/2019: 80% mid, distal RCA s/p DES, 30% narrowing of d LM, widely patent LAD w/ luminal irregularities, widely patent stent in dCX  w/ 90+% stenosis distal to stent beofre small trifurcating obtuse marginal (potentially area of restenosis)   Chronic bronchitis (HCC)    Chronic ulcer of great toe of right foot (HCC) 09/09/2017   Chronic ulcer of right great toe (HCC) 09/19/2017   COPD (chronic obstructive pulmonary disease) (HCC)    Coronary arteriosclerosis    Depression    Diabetes mellitus (HCC)    DVT (deep venous thrombosis) (HCC)    GERD (gastroesophageal reflux disease)    Hyperlipidemia    Hypertension    Migraine    Morbid obesity (HCC)    Myocardial infarction (HCC) 1996   "light one"   PE (pulmonary embolism) 04/2013   On chronic Xarelto   Peripheral nerve disease    Pneumonia "several times"   Sleep apnea    Type II diabetes mellitus (HCC)    Past Surgical History:   Procedure Laterality Date   ABDOMINAL AORTOGRAM W/LOWER EXTREMITY N/A 10/10/2020   Procedure: ABDOMINAL AORTOGRAM W/LOWER EXTREMITY;  Surgeon: Sherren Kerns, MD;  Location: [facility];  Service: Cardiovascular;  Laterality: N/A;   AMPUTATION Right 09/21/2017   Procedure: RIGHT GREAT TOE AMPUTATION, POSSIBLE VAC;  Surgeon: Tarry Kos, MD;  Location: [facility] OR;  Service: Orthopedics;  Laterality: Right;   AMPUTATION Left 08/13/2020   Procedure: LEFT FOOT 4TH RAY AMPUTATION;  Surgeon: [name], MD;  Location: [facility] OR;  Service: Orthopedics;  Laterality: Left;   AMPUTATION Left 11/20/2021   Procedure: LEFT TRANSMETATARSAL AMPUTATION;  Surgeon: [name], MD;  Location: [facility] OR;  Service: Orthopedics;  Laterality: Left;   AMPUTATION Left 01/08/2022   Procedure: LEFT BELOW KNEE AMPUTATION;  Surgeon: [name], MD;  Location: [facility] OR;  Service: Orthopedics;  Laterality: Left;   AMPUTATION Right 07/07/2022   Procedure: RIGHT BELOW KNEE AMPUTATION;  Surgeon: [name], MD;  Location: [facility] OR;  Service: Orthopedics;  Laterality: Right;   AORTOGRAM Bilateral 03/13/2021   Procedure: ABDOMINAL AORTOGRAM WITH Left LOWER EXTREMITY RUNOFF;  Surgeon: Leonie Douglas, MD;  Location: [facility] OR;  Service: Vascular;  Laterality: Bilateral;   APPLICATION OF WOUND VAC  01/08/2022   Procedure: APPLICATION OF WOUND VAC;  Surgeon: [name], MD;  Location: [facility] OR;  Service: Orthopedics;;   APPLICATION OF WOUND VAC Right 07/07/2022   Procedure: APPLICATION OF WOUND VAC;  Surgeon: [name], MD;  Location: [facility] OR;  Service: Orthopedics;  Laterality: Right;   CARDIAC CATHETERIZATION  2006   CARDIAC CATHETERIZATION  1996   "@ [facility]; when I had my heart attack"   CARDIAC CATHETERIZATION N/A 07/29/2016   Procedure: Left Heart Cath and Coronary Angiography;  Surgeon: Runell Gess, MD;  Location: [facility] INVASIVE CV LAB;  Service: Cardiovascular;  Laterality: N/A;   CARDIAC CATHETERIZATION N/A 07/29/2016    Procedure: Coronary Stent Intervention;  Surgeon: Runell Gess, MD;  [facility]: [facility];  Service: Cardiovascular;  Laterality: N/A;   CARPAL TUNNEL RELEASE Bilateral    CORONARY ANGIOPLASTY WITH STENT PLACEMENT  07/29/2016   CORONARY ARTERY BYPASS GRAFT N/A 07/24/2020   Procedure: CORONARY ARTERY BYPASS GRAFTING (CABG), ON PUMP, TIMES FOUR, USING LEFT INTERNAL MAMMARY ARTERY AND ENDOSCOPICALLY HARVESTED RIGHT GREATER SAPHENOUS VEIN;  Surgeon: Corliss Skains, MD;  Location: [facility] OR;  Service: Open Heart Surgery;  Laterality: N/A;  FLOW TAC   CORONARY PRESSURE/FFR STUDY N/A 07/22/2020   Procedure: INTRAVASCULAR PRESSURE WIRE/FFR STUDY;  Surgeon: Marykay Lex, MD;  Location: [facility] INVASIVE CV LAB;  Service: Cardiovascular;  Laterality: N/A;   CORONARY STENT INTERVENTION N/A 05/30/2019   Procedure: CORONARY STENT INTERVENTION;  Surgeon: Lyn Records, MD;  [facility]: [facility];  Service: Cardiovascular;  Laterality: N/A;   CORONARY STENT INTERVENTION N/A 11/02/2019   Procedure: CORONARY STENT INTERVENTION;  Surgeon: Iran Ouch, MD;  [facility]: [facility];  Service: Cardiovascular;  Laterality: N/A;   ESOPHAGOGASTRODUODENOSCOPY N/A 09/22/2017   Procedure: ESOPHAGOGASTRODUODENOSCOPY (EGD);  Surgeon: Meryl Dare, MD;  Location: [facility] ENDOSCOPY;  Service: Endoscopy;  Laterality: N/A;   KNEE ARTHROSCOPY Bilateral    "2 on left; 1 on the right"   LEFT HEART CATH AND CORONARY ANGIOGRAPHY N/A 11/02/2019   Procedure: LEFT HEART CATH AND CORONARY ANGIOGRAPHY;  Surgeon: Iran Ouch, MD;  Location: [facility];  Service: Cardiovascular;  Laterality: N/A;   LEFT HEART CATH AND CORONARY ANGIOGRAPHY N/A 07/22/2020   Procedure: LEFT HEART CATH AND CORONARY ANGIOGRAPHY;  Surgeon: Marykay Lex, MD;  Location: [facility] INVASIVE CV LAB;  Service: Cardiovascular;  Laterality: N/A;   LEFT HEART CATH AND CORS/GRAFTS ANGIOGRAPHY N/A 08/17/2021   Procedure: LEFT HEART CATH AND  CORS/GRAFTS ANGIOGRAPHY;  Surgeon: Yvonne Kendall, MD;  Location: [facility];  Service: Cardiovascular;  Laterality: N/A;   PERIPHERAL VASCULAR INTERVENTION Left 10/11/2020   popliteal and SFA stent placement    PERIPHERAL VASCULAR INTERVENTION Left 10/10/2020   Procedure: PERIPHERAL VASCULAR INTERVENTION;  Surgeon: Sherren Kerns, MD;  [facility]: [facility];  Service: Cardiovascular;  Laterality: Left;   RIGHT/LEFT HEART CATH AND CORONARY ANGIOGRAPHY N/A 05/30/2019   Procedure: RIGHT/LEFT HEART CATH AND CORONARY ANGIOGRAPHY;  Surgeon: Lyn Records, MD;  [facility]: [facility];  Service: Cardiovascular;  Laterality: N/A;   RIGHT/LEFT HEART CATH AND CORONARY/GRAFT ANGIOGRAPHY N/A 06/17/2022   Procedure: RIGHT/LEFT HEART CATH AND CORONARY/GRAFT ANGIOGRAPHY;  Surgeon: Lennette Bihari, MD;  [facility]: [facility];  Service: Cardiovascular;  Laterality: N/A;   SHOULDER OPEN ROTATOR CUFF REPAIR Bilateral    TEE WITHOUT CARDIOVERSION N/A 07/24/2020   Procedure: TRANSESOPHAGEAL ECHOCARDIOGRAM (TEE);  Surgeon: Corliss Skains,  MD;  Location: [facility] OR;  Service: Open Heart Surgery;  Laterality: N/A;   Patient Active Problem List   Diagnosis Date Noted   AKI (acute kidney injury) (HCC) 04/29/2023   Weakness 04/28/2023   Nephrosis 04/27/2023   CHF (congestive heart failure) (HCC) 04/26/2023   OSA (obstructive sleep apnea) 04/26/2023   Preoperative cardiovascular examination 04/25/2023   Hx of iron deficiency anemia 07/07/2022   Syncope and collapse 06/15/2022   AMS (altered mental status) 04/14/2022   Acute respiratory failure with hypoxia (HCC) 04/14/2022   Acute metabolic encephalopathy 04/14/2022   Pneumonia 04/14/2022   Ulcer of right foot, limited to breakdown of skin (HCC)    Below-knee amputation of left lower extremity (HCC)    Osteomyelitis of foot, right, acute (HCC) 01/06/2022   Bipolar disorder with depression (HCC)    Sepsis (HCC) 11/18/2021   Chest  pain 11/18/2021   Dyspnea 11/18/2021   Wound infection    Ulcer of left foot with necrosis of bone (HCC)    Bicuspid aortic valve 09/14/2021   Aneurysm of ascending aorta (HCC) 09/14/2021   DM (diabetes mellitus) with peripheral vascular complication (HCC) 08/20/2021   Atypical chest pain 07/17/2021   Hyponatremia 07/17/2021   Leg wound, left 07/17/2021   Essential hypertension 07/17/2021   Acute hyperglycemia 07/09/2021   Non-ST elevation (NSTEMI) myocardial infarction (HCC) 07/22/2020   Heart failure with preserved ejection fraction (HCC) 07/22/2020   Hypotension 07/18/2020   Elevated troponin 07/18/2020   Acute renal failure superimposed on stage 3a chronic kidney disease (HCC) 07/18/2020   Elevated brain natriuretic peptide (BNP) level 07/18/2020   Edema 06/13/2019   Leukocytosis 05/31/2019   Smoker 03/08/2019   Chronic back pain 02/16/2019   Deep venous thrombosis (HCC) 02/16/2019   Obesity, Class III, BMI 40-49.9 (morbid obesity) (HCC) 02/16/2019   Peripheral nerve disease 02/16/2019   COPD (chronic obstructive pulmonary disease) (HCC) 01/22/2019   Anxiety and depression 03/26/2018   Sinusitis 10/12/2017   Abdominal pain    Loss of weight    Diabetic foot infection (HCC) 09/16/2017   Cellulitis of right foot    Recurrent chest pain 07/29/2016   Hyperglycemia    Insulin dependent type 2 diabetes mellitus (HCC) 04/29/2015   History of pulmonary embolus (PE) 04/27/2013   CAD (coronary artery disease) 09/10/2008    ONSET DATE: 04/27/2023  REFERRING DIAG: R53.1 (ICD-10-CM) - Weakness  THERAPY DIAG:  Muscle weakness (generalized)  Other abnormalities of gait and mobility  Unsteadiness on feet  Rationale for Evaluation and Treatment: Rehabilitation  SUBJECTIVE:  SUBJECTIVE  STATEMENT: Patient reports that he is still using 30 ply on L and 11 ply on the R. Patient to see [facility] tomorrow. Denies falls/near falls.   Pt accompanied by: self  PERTINENT HISTORY:  PMH signficant for AAA aneurysm, anxiety, asthma, CAD, COPD, DM, DVT, HNT, migraine, morbid obesity, MI, PE, peripheral nerve disease, B BKA with prosthetics, CABG, bilateral shoulder open rotator cuff repair.  PAIN:  Are you having pain? Yes: NPRS scale: > 10/10 Pain location: Bilateral shoulders and hips are worst, L knee Pain description: achy  PRECAUTIONS: Fall and Other: B BKA  WEIGHT BEARING RESTRICTIONS: No  FALLS: Has patient fallen in last 6 months? Yes. Number of falls 2-3; per pt was not injured during fall; per pt he can get back up by himself  LIVING ENVIRONMENT: Lives with: lives with their spouse Lives in: House/apartment Stairs: No Has following equipment at home: Environmental consultant - 2 wheeled, Wheelchair (power), Wheelchair (manual), and Ramped entry  PLOF: Independent with gait, Independent with transfers, and Requires assistive device for independence  PATIENT GOALS: "to walk"  OBJECTIVE:   DIAGNOSTIC FINDINGS:  None relevant to this POC  COGNITION: Overall cognitive status:  decreased memory per patient report   SENSATION: Impaired due to neuropathy in residual limbs  POSTURE: rounded shoulders, forward head, and posterior pelvic tilt  LOWER EXTREMITY ROM:   *not assessed at eval  Active  Right Eval Left Eval  Hip flexion    Hip extension    Hip abduction    Hip adduction    Hip internal rotation    Hip external rotation    Knee flexion    Knee extension    Ankle dorsiflexion    Ankle plantarflexion    Ankle inversion    Ankle eversion     (Blank rows = not tested)  LOWER EXTREMITY MMT:  *not assessed at eval  MMT Right Eval Left Eval  Hip flexion    Hip extension    Hip abduction    Hip adduction    Hip internal rotation    Hip external rotation     Knee flexion    Knee extension    Ankle dorsiflexion    Ankle plantarflexion    Ankle inversion    Ankle eversion    (Blank rows = not tested)  VITALS  Vitals:   06/28/23 1416  BP: (!) 131/91  Pulse: 92   Blood glucose: 167   TODAY'S TREATMENT:       Ther Act  Continued discussion on need for better fitting prosthetic as session remains very limited by degree of rotation in both sockets when patient attempts to ambulate. Also assessed skin on RLE as patient had a cut. It is heeling well and no open spots noted. Updated patient's note for [facility] with typo correction.   NMR:  Between // bars training with mix of CGA-SBA for all activities Fwd stepping with bilateral UE support 2 x 10 feet Very limited by poor fitting prosthetic and degree of rotation particularly on L, unable to continue stepping Trialed static standing balance without UE support with 5 x 10-40 second trials Dynamic cross body reaching and return 2 x 10 cones without UE support (improved duration of stability and weigh shift with dual task compared to balance strategies seen in static balance alone) WBOS semitandem stance 2 x 20 seconds bilaterally but decreased tolerance with LLE back due to knee pain Step return trials but unable to complete due to LLE knee pain  and prosthetics shifting to much   PATIENT EDUCATION: Education details: Continue HEP, letter to give prosthetist updates Person educated: Patient Education method: Explanation and Handouts Education comprehension: verbalized understanding and needs further education  HOME EXERCISE PROGRAM: Do each exercise 1-2  times per day Do each exercise 10 repetitions Hold each exercise for 2 seconds to feel your location  AT SINK FIND YOUR MIDLINE POSITION AND PLACE FEET EQUAL DISTANCE FROM THE MIDLINE.  Try to find this position when standing still for activities.   USE TAPE ON FLOOR TO MARK THE MIDLINE POSITION. You also should try to feel with your  limb pressure in socket.  You are trying to feel with limb what you used to feel with the bottom of your foot.  Side to Side Shift: Moving your hips only (not shoulders): move weight onto your left leg, HOLD/FEEL.  Move back to equal weight on each leg, HOLD/FEEL. Move weight onto your right leg, HOLD/FEEL. Move back to equal weight on each leg, HOLD/FEEL. Repeat.  Start with both hands on sink, progress to right hand only, then no hands.  Front to Back Shift: Moving your hips only (not shoulders): move your weight forward onto your toes, HOLD/FEEL. Move your weight back to equal Flat Foot on both legs, HOLD/FEEL. Move your weight back onto your heels, HOLD/FEEL. Move your weight back to equal on both legs, HOLD/FEEL. Repeat.   tart with both hands on sink, progress to right hand only, then no hands. Moving Cones / Cups: With equal weight on each leg: Hold on with one hand the first time, then progress to no hand supports. Move cups from one side of sink to the other. Place cups ~2" out of your reach, progress to 10" beyond reach.  Place one hand in middle of sink and reach with other hand. Do both arms.  Then hover one hand and move cups with other hand.  Overhead/Upward Reaching: alternated reaching up to top cabinets or ceiling if no cabinets present. Keep equal weight on each leg. Start with one hand support on counter while other hand reaches and progress to no hand support with reaching.  ace one hand in middle of sink and reach with other hand. Do both arms.  Then hover one hand and move cups with other hand.  5.   Looking Over Shoulders: With equal weight on each leg: alternate turning to look over your shoulders with one hand support on counter as needed.  Start with head motions only to look in front of shoulder, then even with shoulder and progress to looking behind you. To look to side, move head /eyes, then shoulder on side looking pulls back, shift more weight to side looking and pull hip back.  ace one hand in middle of sink and let go with other hand so your shoulder can pull back. Switch hands to look other way.   Then hover one hand and move cups with other hand.  6.  Stepping with leg :  Move items under cabinet out of your way. Shift your hips/pelvis so weight on prosthesis. SLOWLY step other leg so front of foot is in cabinet. Then step back to floor.    GOALS: Goals reviewed with patient? Yes  SHORT TERM GOALS: Target date: 06/25/2023  Pt will be independent with initial HEP for improved strength, balance, transfers and gait. Baseline: Goal status: IN PROGRESS  2.  Pt will perform sit to stand transfers w/SBA and LRAD for improved independence and  BLE strength Baseline: CGA Goal status: MET  3. Pt will ascend/descend ramp w/LRAD and CGA for improved safety w/household ambulation  Baseline: not assessed - pt has video of ramp navigation using rails Goal status: IN PROGRESS  4.  Pt to demonstrate compliance with BLE prosthetic wear schedule and residual limb skin checks as well as residual limb measurement  Baseline: provided education on 7/5 Goal status: IN PROGRESS   LONG TERM GOALS: Target date: 07/23/2023  Pt will be independent with final HEP for improved strength, balance, transfers and gait. Baseline:  Goal status: INITIAL  2.  Pt will be mod I w/ all transfers w/LRAD for improved independence and safety w/prosthetics  Baseline: CGA Goal status: REVISED  3.  Pt will ambulate >200' on level surfaces w/LRAD and SBA for improved endurance, safety w/prosthetics and strength  Baseline:  Goal status: REVISED   ASSESSMENT:  CLINICAL IMPRESSION: Session continues to be limited by poor fitting prosthetic and degree of L knee pain in today's session. Session modified to between // work to improve safety during session. Will likely have to hold ambulation until patient can receive better fitting prosthetic or have adjustments made to ensure safety. Patient to see  [facility] before next session. Patient will continue to benefit from skilled PT to improve safety and progress towards goals. Will continue POC as able.   OBJECTIVE IMPAIRMENTS: decreased balance, decreased endurance, decreased knowledge of use of DME, decreased mobility, difficulty walking, prosthetic dependency , and obesity.   ACTIVITY LIMITATIONS: standing, squatting, and stairs  PARTICIPATION LIMITATIONS: driving and community activity  PERSONAL FACTORS: Fitness, Transportation, and 3+ comorbidities:    AAA aneurysm, anxiety, asthma, CAD, COPD, DM, DVT, HNT, migraine, morbid obesity, MI, PE, peripheral nerve disease, B BKA with prosthetics, CABG, bilateral shoulder open rotator cuff repair.are also affecting patient's functional outcome.   REHAB POTENTIAL: Good  CLINICAL DECISION MAKING: Stable/uncomplicated  EVALUATION COMPLEXITY: Moderate  PLAN:  PT FREQUENCY: 2x/week  PT DURATION: 8 weeks  PLANNED INTERVENTIONS: Therapeutic exercises, Therapeutic activity, Neuromuscular re-education, Balance training, Gait training, Patient/Family education, Self Care, Prosthetic training, DME instructions, Wheelchair mobility training, scar mobilization, Manual therapy, and Re-evaluation  PLAN FOR NEXT SESSION: Finish goal assessment.  Did he bring socks? gait due to instability of L BKA prosthetic. Scifit for endurance, gait training and activities working on balance, was [facility] able to make adjustments   Carmelia Bake, PT, DPT  06/28/2023, 3:59 PM

## 2023-06-30 ENCOUNTER — Ambulatory Visit: Payer: Medicare HMO | Admitting: Physical Therapy

## 2023-07-05 ENCOUNTER — Ambulatory Visit: Payer: Medicare HMO | Admitting: Physical Therapy

## 2023-07-07 ENCOUNTER — Ambulatory Visit: Payer: Medicare HMO | Admitting: Physical Therapy

## 2023-07-12 ENCOUNTER — Ambulatory Visit: Payer: Medicare HMO | Admitting: Physical Therapy

## 2023-07-14 ENCOUNTER — Ambulatory Visit: Payer: Medicare HMO | Admitting: Physical Therapy

## 2023-07-19 ENCOUNTER — Ambulatory Visit: Payer: Medicare HMO | Admitting: Physical Therapy

## 2023-07-21 ENCOUNTER — Ambulatory Visit: Payer: Medicare HMO | Admitting: Physical Therapy

## 2023-07-27 ENCOUNTER — Other Ambulatory Visit: Payer: Self-pay | Admitting: Physician Assistant

## 2023-07-29 ENCOUNTER — Ambulatory Visit: Payer: Medicare HMO | Attending: Family Medicine | Admitting: Physical Therapy

## 2023-07-29 VITALS — BP 136/84 | HR 80

## 2023-07-29 DIAGNOSIS — R2689 Other abnormalities of gait and mobility: Secondary | ICD-10-CM | POA: Diagnosis present

## 2023-07-29 DIAGNOSIS — R2681 Unsteadiness on feet: Secondary | ICD-10-CM | POA: Insufficient documentation

## 2023-07-29 DIAGNOSIS — M6281 Muscle weakness (generalized): Secondary | ICD-10-CM | POA: Insufficient documentation

## 2023-07-29 NOTE — Therapy (Signed)
OUTPATIENT PHYSICAL THERAPY NEURO TREATMENT   Patient Name: Joseph [name] MRN: [id number] DOB:1971/05/15, 52 y.o., male Today's Date: 07/29/2023   PCP: Lucile Crater FNP-BC ([facility]) REFERRING PROVIDER: Carney Living, MD  END OF SESSION:  PT End of Session - 07/29/23 1343     Visit Number 8    Number of Visits 17    Date for PT Re-Evaluation 08/19/23    Authorization Type [employer]    Progress Note Due on Visit 10    PT Start Time 1440    PT Stop Time 1522    PT Time Calculation (min) 42 min    Equipment Utilized During Treatment Gait belt    Activity Tolerance Patient tolerated treatment well    Behavior During Therapy WFL for tasks assessed/performed                Past Medical History:  Diagnosis Date   Aneurysm of ascending aorta (HCC) 09/14/2021   Anxiety    Arthritis    "knees, shoulders, hips, ankles" (07/29/2016)   Asthma    Bicuspid aortic valve 09/14/2021   CAD (coronary artery disease)    a. 2017: s/p BMS to distal Cx; b. LHC 05/30/2019: 80% mid, distal RCA s/p DES, 30% narrowing of d LM, widely patent LAD w/ luminal irregularities, widely patent stent in dCX  w/ 90+% stenosis distal to stent beofre small trifurcating obtuse marginal (potentially area of restenosis)   Chronic bronchitis (HCC)    Chronic ulcer of great toe of right foot (HCC) 09/09/2017   Chronic ulcer of right great toe (HCC) 09/19/2017   COPD (chronic obstructive pulmonary disease) (HCC)    Coronary arteriosclerosis    Depression    Diabetes mellitus (HCC)    DVT (deep venous thrombosis) (HCC)    GERD (gastroesophageal reflux disease)    Hyperlipidemia    Hypertension    Migraine    Morbid obesity (HCC)    Myocardial infarction (HCC) 1996   "light one"   PE (pulmonary embolism) 04/2013   On chronic Xarelto   Peripheral nerve disease    Pneumonia "several times"   Sleep apnea    Type II diabetes mellitus (HCC)    Past Surgical History:  Procedure Laterality  Date   ABDOMINAL AORTOGRAM W/LOWER EXTREMITY N/A 10/10/2020   Procedure: ABDOMINAL AORTOGRAM W/LOWER EXTREMITY;  Surgeon: Sherren Kerns, MD;  Location: [facility];  Service: Cardiovascular;  Laterality: N/A;   AMPUTATION Right 09/21/2017   Procedure: RIGHT GREAT TOE AMPUTATION, POSSIBLE VAC;  Surgeon: Tarry Kos, MD;  Location: [facility] OR;  Service: Orthopedics;  Laterality: Right;   AMPUTATION Left 08/13/2020   Procedure: LEFT FOOT 4TH RAY AMPUTATION;  Surgeon: [name], MD;  Location: [facility] OR;  Service: Orthopedics;  Laterality: Left;   AMPUTATION Left 11/20/2021   Procedure: LEFT TRANSMETATARSAL AMPUTATION;  Surgeon: [name], MD;  Location: [facility] OR;  Service: Orthopedics;  Laterality: Left;   AMPUTATION Left 01/08/2022   Procedure: LEFT BELOW KNEE AMPUTATION;  Surgeon: [name], MD;  Location: [facility] OR;  Service: Orthopedics;  Laterality: Left;   AMPUTATION Right 07/07/2022   Procedure: RIGHT BELOW KNEE AMPUTATION;  Surgeon: [name], MD;  Location: [facility] OR;  Service: Orthopedics;  Laterality: Right;   AORTOGRAM Bilateral 03/13/2021   Procedure: ABDOMINAL AORTOGRAM WITH Left LOWER EXTREMITY RUNOFF;  Surgeon: Leonie Douglas, MD;  Location: [facility] OR;  Service: Vascular;  Laterality: Bilateral;   APPLICATION OF WOUND VAC  01/08/2022   Procedure: APPLICATION OF WOUND VAC;  Surgeon: [name], MD;  Location: [facility] OR;  Service: Orthopedics;;   APPLICATION OF WOUND VAC Right 07/07/2022   Procedure: APPLICATION OF WOUND VAC;  Surgeon: [name], MD;  Location: [facility] OR;  Service: Orthopedics;  Laterality: Right;   CARDIAC CATHETERIZATION  2006   CARDIAC CATHETERIZATION  1996   "@ [facility]; when I had my heart attack"   CARDIAC CATHETERIZATION N/A 07/29/2016   Procedure: Left Heart Cath and Coronary Angiography;  Surgeon: Runell Gess, MD;  Location: [facility] INVASIVE CV LAB;  Service: Cardiovascular;  Laterality: N/A;   CARDIAC CATHETERIZATION N/A 07/29/2016   Procedure: Coronary  Stent Intervention;  Surgeon: Runell Gess, MD;  Location: [facility];  Service: Cardiovascular;  Laterality: N/A;   CARPAL TUNNEL RELEASE Bilateral    CORONARY ANGIOPLASTY WITH STENT PLACEMENT  07/29/2016   CORONARY ARTERY BYPASS GRAFT N/A 07/24/2020   Procedure: CORONARY ARTERY BYPASS GRAFTING (CABG), ON PUMP, TIMES FOUR, USING LEFT INTERNAL MAMMARY ARTERY AND ENDOSCOPICALLY HARVESTED RIGHT GREATER SAPHENOUS VEIN;  Surgeon: Corliss Skains, MD;  Location: [facility] OR;  Service: Open Heart Surgery;  Laterality: N/A;  FLOW TAC   CORONARY PRESSURE/FFR STUDY N/A 07/22/2020   Procedure: INTRAVASCULAR PRESSURE WIRE/FFR STUDY;  Surgeon: Marykay Lex, MD;  Location: [facility] INVASIVE CV LAB;  Service: Cardiovascular;  Laterality: N/A;   CORONARY STENT INTERVENTION N/A 05/30/2019   Procedure: CORONARY STENT INTERVENTION;  Surgeon: Lyn Records, MD;  Location: [facility];  Service: Cardiovascular;  Laterality: N/A;   CORONARY STENT INTERVENTION N/A 11/02/2019   Procedure: CORONARY STENT INTERVENTION;  Surgeon: Iran Ouch, MD;  Location: [facility];  Service: Cardiovascular;  Laterality: N/A;   ESOPHAGOGASTRODUODENOSCOPY N/A 09/22/2017   Procedure: ESOPHAGOGASTRODUODENOSCOPY (EGD);  Surgeon: Meryl Dare, MD;  Location: [facility] ENDOSCOPY;  Service: Endoscopy;  Laterality: N/A;   KNEE ARTHROSCOPY Bilateral    "2 on left; 1 on the right"   LEFT HEART CATH AND CORONARY ANGIOGRAPHY N/A 11/02/2019   Procedure: LEFT HEART CATH AND CORONARY ANGIOGRAPHY;  Surgeon: Iran Ouch, MD;  Location: [facility];  Service: Cardiovascular;  Laterality: N/A;   LEFT HEART CATH AND CORONARY ANGIOGRAPHY N/A 07/22/2020   Procedure: LEFT HEART CATH AND CORONARY ANGIOGRAPHY;  Surgeon: Marykay Lex, MD;  Location: [facility] INVASIVE CV LAB;  Service: Cardiovascular;  Laterality: N/A;   LEFT HEART CATH AND CORS/GRAFTS ANGIOGRAPHY N/A 08/17/2021   Procedure: LEFT HEART CATH AND CORS/GRAFTS  ANGIOGRAPHY;  Surgeon: Yvonne Kendall, MD;  Location: [facility];  Service: Cardiovascular;  Laterality: N/A;   PERIPHERAL VASCULAR INTERVENTION Left 10/11/2020   popliteal and SFA stent placement    PERIPHERAL VASCULAR INTERVENTION Left 10/10/2020   Procedure: PERIPHERAL VASCULAR INTERVENTION;  Surgeon: Sherren Kerns, MD;  Location: [facility];  Service: Cardiovascular;  Laterality: Left;   RIGHT/LEFT HEART CATH AND CORONARY ANGIOGRAPHY N/A 05/30/2019   Procedure: RIGHT/LEFT HEART CATH AND CORONARY ANGIOGRAPHY;  Surgeon: Lyn Records, MD;  Location: [facility];  Service: Cardiovascular;  Laterality: N/A;   RIGHT/LEFT HEART CATH AND CORONARY/GRAFT ANGIOGRAPHY N/A 06/17/2022   Procedure: RIGHT/LEFT HEART CATH AND CORONARY/GRAFT ANGIOGRAPHY;  Surgeon: Lennette Bihari, MD;  Location: [facility];  Service: Cardiovascular;  Laterality: N/A;   SHOULDER OPEN ROTATOR CUFF REPAIR Bilateral    TEE WITHOUT CARDIOVERSION N/A 07/24/2020   Procedure: TRANSESOPHAGEAL ECHOCARDIOGRAM (TEE);  Surgeon: Corliss Skains,  MD;  Location: [facility] OR;  Service: Open Heart Surgery;  Laterality: N/A;   Patient Active Problem List   Diagnosis Date Noted   AKI (acute kidney injury) (HCC) 04/29/2023   Weakness 04/28/2023   Nephrosis 04/27/2023   CHF (congestive heart failure) (HCC) 04/26/2023   OSA (obstructive sleep apnea) 04/26/2023   Preoperative cardiovascular examination 04/25/2023   Hx of iron deficiency anemia 07/07/2022   Syncope and collapse 06/15/2022   AMS (altered mental status) 04/14/2022   Acute respiratory failure with hypoxia (HCC) 04/14/2022   Acute metabolic encephalopathy 04/14/2022   Pneumonia 04/14/2022   Ulcer of right foot, limited to breakdown of skin (HCC)    Below-knee amputation of left lower extremity (HCC)    Osteomyelitis of foot, right, acute (HCC) 01/06/2022   Bipolar disorder with depression (HCC)    Sepsis (HCC) 11/18/2021   Chest pain  11/18/2021   Dyspnea 11/18/2021   Wound infection    Ulcer of left foot with necrosis of bone (HCC)    Bicuspid aortic valve 09/14/2021   Aneurysm of ascending aorta (HCC) 09/14/2021   DM (diabetes mellitus) with peripheral vascular complication (HCC) 08/20/2021   Atypical chest pain 07/17/2021   Hyponatremia 07/17/2021   Leg wound, left 07/17/2021   Essential hypertension 07/17/2021   Acute hyperglycemia 07/09/2021   Non-ST elevation (NSTEMI) myocardial infarction (HCC) 07/22/2020   Heart failure with preserved ejection fraction (HCC) 07/22/2020   Hypotension 07/18/2020   Elevated troponin 07/18/2020   Acute renal failure superimposed on stage 3a chronic kidney disease (HCC) 07/18/2020   Elevated brain natriuretic peptide (BNP) level 07/18/2020   Edema 06/13/2019   Leukocytosis 05/31/2019   Smoker 03/08/2019   Chronic back pain 02/16/2019   Deep venous thrombosis (HCC) 02/16/2019   Obesity, Class III, BMI 40-49.9 (morbid obesity) (HCC) 02/16/2019   Peripheral nerve disease 02/16/2019   COPD (chronic obstructive pulmonary disease) (HCC) 01/22/2019   Anxiety and depression 03/26/2018   Sinusitis 10/12/2017   Abdominal pain    Loss of weight    Diabetic foot infection (HCC) 09/16/2017   Cellulitis of right foot    Recurrent chest pain 07/29/2016   Hyperglycemia    Insulin dependent type 2 diabetes mellitus (HCC) 04/29/2015   History of pulmonary embolus (PE) 04/27/2013   CAD (coronary artery disease) 09/10/2008    ONSET DATE: 04/27/2023  REFERRING DIAG: R53.1 (ICD-10-CM) - Weakness  THERAPY DIAG:  Muscle weakness (generalized)  Other abnormalities of gait and mobility  Unsteadiness on feet  Rationale for Evaluation and Treatment: Rehabilitation  SUBJECTIVE:  SUBJECTIVE  STATEMENT: Patient presents w/new LLE socket and liner. Was unable to fit the liner on this morning, wearing 7ply on that leg currently. Wearing 9ply on RLE States his cardiologist wants him to stop taking Lasix, "that ain't happening". Denies falls   Pt accompanied by: self  PERTINENT HISTORY:  PMH signficant for AAA aneurysm, anxiety, asthma, CAD, COPD, DM, DVT, HNT, migraine, morbid obesity, MI, PE, peripheral nerve disease, B BKA with prosthetics, CABG, bilateral shoulder open rotator cuff repair.  PAIN:  Are you having pain? Yes: NPRS scale: 7/10 Pain location: Bilateral shoulders and hips are worst, L knee Pain description: achy  PRECAUTIONS: Fall and Other: B BKA  WEIGHT BEARING RESTRICTIONS: No  FALLS: Has patient fallen in last 6 months? Yes. Number of falls 2-3; per pt was not injured during fall; per pt he can get back up by himself  LIVING ENVIRONMENT: Lives with: lives with their spouse Lives in: House/apartment Stairs: No Has following equipment at home: Environmental consultant - 2 wheeled, Wheelchair (power), Wheelchair (manual), and Ramped entry  PLOF: Independent with gait, Independent with transfers, and Requires assistive device for independence  PATIENT GOALS: "to walk"  OBJECTIVE:   DIAGNOSTIC FINDINGS:  None relevant to this POC  COGNITION: Overall cognitive status:  decreased memory per patient report   SENSATION: Impaired due to neuropathy in residual limbs  POSTURE: rounded shoulders, forward head, and posterior pelvic tilt  LOWER EXTREMITY ROM:   *not assessed at eval  Active  Right Eval Left Eval  Hip flexion    Hip extension    Hip abduction    Hip adduction    Hip internal rotation    Hip external rotation    Knee flexion    Knee extension    Ankle dorsiflexion    Ankle plantarflexion    Ankle inversion    Ankle eversion     (Blank rows = not tested)  LOWER EXTREMITY MMT:  *not assessed at eval  MMT Right Eval Left Eval  Hip flexion     Hip extension    Hip abduction    Hip adduction    Hip internal rotation    Hip external rotation    Knee flexion    Knee extension    Ankle dorsiflexion    Ankle plantarflexion    Ankle inversion    Ankle eversion    (Blank rows = not tested)  VITALS  Vitals:   07/29/23 1351  BP: 136/84  Pulse: 80    Blood glucose: not assessed today   TODAY'S TREATMENT:       Gait training/Ther Act Assessed pt's new L socket and placed liner inside socket. Pt now able to don socket w/liner and 7ply socks due to change in limb size from this morning. Pt reports his limb is no longer "circular" in the morning. Pt states he is not wearing a shrinker at night, stopped wearing one a while ago. Encouraged pt to start wearing this again to reduce swelling in leg and maintain shape of limb.  Noted pt's pylon of L socket is biasing varus position. Pt's LLE already going into varus and pt reports his socket hurts the medial aspect of his LLE.    Gait pattern:  excessive varus of LLE, step through pattern, decreased hip/knee flexion- Right, decreased hip/knee flexion- Left, and wide BOS Distance walked: 115' x2 Assistive device utilized: Environmental consultant - 2 wheeled Level of assistance: SBA Comments: Pt reports having pain in medial aspect of LLE due to  excessive varus of L prosthetic and medial wall of socket digging into pt's leg. Due to impaired sensation, pt unable to assess if he is putting more pressure on one side of his distal residual limb on L side.   Informed pt to reduce weightbearing on current socket due to risk of pressure injury/skin breakdown. Assessed LLE at end of session and noted redness along medial aspect of residual limb. Informed pt to do hourly skin checks if he is ambulatory. Pt to see [facility] on 8/16, therapist to call prosthetist prior to then to discuss issues w/L prosthesis.     PATIENT EDUCATION: Education details: See above Person educated: Patient Education method:  Chief Technology Officer Education comprehension: verbalized understanding and needs further education  HOME EXERCISE PROGRAM: Do each exercise 1-2  times per day Do each exercise 10 repetitions Hold each exercise for 2 seconds to feel your location  AT SINK FIND YOUR MIDLINE POSITION AND PLACE FEET EQUAL DISTANCE FROM THE MIDLINE.  Try to find this position when standing still for activities.   USE TAPE ON FLOOR TO MARK THE MIDLINE POSITION. You also should try to feel with your limb pressure in socket.  You are trying to feel with limb what you used to feel with the bottom of your foot.  Side to Side Shift: Moving your hips only (not shoulders): move weight onto your left leg, HOLD/FEEL.  Move back to equal weight on each leg, HOLD/FEEL. Move weight onto your right leg, HOLD/FEEL. Move back to equal weight on each leg, HOLD/FEEL. Repeat.  Start with both hands on sink, progress to right hand only, then no hands.  Front to Back Shift: Moving your hips only (not shoulders): move your weight forward onto your toes, HOLD/FEEL. Move your weight back to equal Flat Foot on both legs, HOLD/FEEL. Move your weight back onto your heels, HOLD/FEEL. Move your weight back to equal on both legs, HOLD/FEEL. Repeat.   tart with both hands on sink, progress to right hand only, then no hands. Moving Cones / Cups: With equal weight on each leg: Hold on with one hand the first time, then progress to no hand supports. Move cups from one side of sink to the other. Place cups ~2" out of your reach, progress to 10" beyond reach.  Place one hand in middle of sink and reach with other hand. Do both arms.  Then hover one hand and move cups with other hand.  Overhead/Upward Reaching: alternated reaching up to top cabinets or ceiling if no cabinets present. Keep equal weight on each leg. Start with one hand support on counter while other hand reaches and progress to no hand support with reaching.  ace one hand in middle of sink  and reach with other hand. Do both arms.  Then hover one hand and move cups with other hand.  5.   Looking Over Shoulders: With equal weight on each leg: alternate turning to look over your shoulders with one hand support on counter as needed.  Start with head motions only to look in front of shoulder, then even with shoulder and progress to looking behind you. To look to side, move head /eyes, then shoulder on side looking pulls back, shift more weight to side looking and pull hip back. ace one hand in middle of sink and let go with other hand so your shoulder can pull back. Switch hands to look other way.   Then hover one hand and move cups with other hand.  6.  Stepping with leg :  Move items under cabinet out of your way. Shift your hips/pelvis so weight on prosthesis. SLOWLY step other leg so front of foot is in cabinet. Then step back to floor.    GOALS: Goals reviewed with patient? Yes  SHORT TERM GOALS: Target date: 06/25/2023  Pt will be independent with initial HEP for improved strength, balance, transfers and gait. Baseline: Goal status: IN PROGRESS  2.  Pt will perform sit to stand transfers w/SBA and LRAD for improved independence and BLE strength Baseline: CGA Goal status: MET  3. Pt will ascend/descend ramp w/LRAD and CGA for improved safety w/household ambulation  Baseline: not assessed - pt has video of ramp navigation using rails Goal status: IN PROGRESS  4.  Pt to demonstrate compliance with BLE prosthetic wear schedule and residual limb skin checks as well as residual limb measurement  Baseline: provided education on 7/5 Goal status: IN PROGRESS   LONG TERM GOALS: Target date: 08/19/2023 (updated to match cert date)    Pt will be independent with final HEP for improved strength, balance, transfers and gait. Baseline:  Goal status: IN PROGRESS  2.  Pt will be mod I w/ all transfers w/LRAD for improved independence and safety w/prosthetics  Baseline: CGA; SBA on  8/9 Goal status: IN PROGRESS  3.  Pt will ambulate >200' on level surfaces w/LRAD and SBA for improved endurance, safety w/prosthetics and strength  Baseline:  Goal status: IN PROGRESS  4.  Pt will ascend/descend ramp w/LRAD and CGA for improved safety w/household ambulation  Baseline: unable to assess in clinic due to safety concerns w/prosthetics- pt has video of ramp navigation using rails Goal status: IN PROGRESS   ASSESSMENT:  CLINICAL IMPRESSION: Session continues to be limited by poor fitting prosthetic this date. Pt did receive new L socket from [facility] 3 weeks ago w/liner to accommodate fluctuations in limb size. However, pt has not been wearing shrinker at night and has had difficulty managing liner in socket. Unable to fully assess goals this date due to excessive varus of L knee in prosthesis and upon further assessment, pt's pylon set to varus position. Pt to see [facility] next week and therapist to call prosthetist to discuss issues seen in clinic. Updated goal date to match cert date, as pt continues to progress towards goals despite setbacks in residual limb management.   OBJECTIVE IMPAIRMENTS: decreased balance, decreased endurance, decreased knowledge of use of DME, decreased mobility, difficulty walking, prosthetic dependency , and obesity.   ACTIVITY LIMITATIONS: standing, squatting, and stairs  PARTICIPATION LIMITATIONS: driving and community activity  PERSONAL FACTORS: Fitness, Transportation, and 3+ comorbidities:    AAA aneurysm, anxiety, asthma, CAD, COPD, DM, DVT, HNT, migraine, morbid obesity, MI, PE, peripheral nerve disease, B BKA with prosthetics, CABG, bilateral shoulder open rotator cuff repair.are also affecting patient's functional outcome.   REHAB POTENTIAL: Good  CLINICAL DECISION MAKING: Stable/uncomplicated  EVALUATION COMPLEXITY: Moderate  PLAN:  PT FREQUENCY: 2x/week  PT DURATION: 8 weeks  PLANNED INTERVENTIONS: Therapeutic exercises,  Therapeutic activity, Neuromuscular re-education, Balance training, Gait training, Patient/Family education, Self Care, Prosthetic training, DME instructions, Wheelchair mobility training, scar mobilization, Manual therapy, and Re-evaluation  PLAN FOR NEXT SESSION:  Did he bring socks? gait due to instability of L BKA prosthetic. Scifit for endurance, gait training and activities working on balance, was [facility] able to make adjustments?  *Called Bobby on 8/9 and was told that at [facility], pt's residual limb too large to fully assess what  is happening in socket. Informed Bobby of excessive varus position of L knee and need to make changes. Also informed him that pt has not been wearing shrinker at night and pt may need new one. Pt aware that he needs to be wearing shrinker.    Jill Alexanders , PT, DPT  07/29/2023, 2:39 PM

## 2023-08-08 ENCOUNTER — Ambulatory Visit: Payer: Medicare HMO | Admitting: Physical Therapy

## 2023-08-08 ENCOUNTER — Encounter: Payer: Self-pay | Admitting: Physical Therapy

## 2023-08-08 DIAGNOSIS — R2681 Unsteadiness on feet: Secondary | ICD-10-CM

## 2023-08-08 DIAGNOSIS — R2689 Other abnormalities of gait and mobility: Secondary | ICD-10-CM

## 2023-08-08 DIAGNOSIS — M6281 Muscle weakness (generalized): Secondary | ICD-10-CM | POA: Diagnosis not present

## 2023-08-08 NOTE — Therapy (Signed)
OUTPATIENT PHYSICAL THERAPY NEURO TREATMENT   Patient Name: Joseph [name] MRN: [id number] DOB:[birth year], 52 y.o., male Today's Date: 08/08/2023   PCP: Lucile Crater FNP-BC ([facility]) REFERRING PROVIDER: Carney Living, MD  END OF SESSION:  PT End of Session - 08/08/23 1459     Visit Number 9    Number of Visits 17    Date for PT Re-Evaluation 08/19/23    Authorization Type [employer]    Progress Note Due on Visit 10    PT Start Time 1455   PT ran over with eval prior.   PT Stop Time 1534    PT Time Calculation (min) 39 min    Equipment Utilized During Treatment Gait belt    Activity Tolerance Patient tolerated treatment well;Patient limited by fatigue;Patient limited by pain    Behavior During Therapy WFL for tasks assessed/performed                Past Medical History:  Diagnosis Date   Aneurysm of ascending aorta (HCC) 09/14/2021   Anxiety    Arthritis    "knees, shoulders, hips, ankles" (07/29/2016)   Asthma    Bicuspid aortic valve 09/14/2021   CAD (coronary artery disease)    a. 2017: s/p BMS to distal Cx; b. LHC 05/30/2019: 80% mid, distal RCA s/p DES, 30% narrowing of d LM, widely patent LAD w/ luminal irregularities, widely patent stent in dCX  w/ 90+% stenosis distal to stent beofre small trifurcating obtuse marginal (potentially area of restenosis)   Chronic bronchitis (HCC)    Chronic ulcer of great toe of right foot (HCC) 09/09/2017   Chronic ulcer of right great toe (HCC) 09/19/2017   COPD (chronic obstructive pulmonary disease) (HCC)    Coronary arteriosclerosis    Depression    Diabetes mellitus (HCC)    DVT (deep venous thrombosis) (HCC)    GERD (gastroesophageal reflux disease)    Hyperlipidemia    Hypertension    Migraine    Morbid obesity (HCC)    Myocardial infarction (HCC) 1996   "light one"   PE (pulmonary embolism) 04/2013   On chronic Xarelto   Peripheral nerve disease    Pneumonia "several times"   Sleep apnea     Type II diabetes mellitus (HCC)    Past Surgical History:  Procedure Laterality Date   ABDOMINAL AORTOGRAM W/LOWER EXTREMITY N/A 10/10/2020   Procedure: ABDOMINAL AORTOGRAM W/LOWER EXTREMITY;  Surgeon: Sherren Kerns, MD;  [facility]: [facility];  Service: Cardiovascular;  Laterality: N/A;   AMPUTATION Right 09/21/2017   Procedure: RIGHT GREAT TOE AMPUTATION, POSSIBLE VAC;  Surgeon: Tarry Kos, MD;  Location: [facility] OR;  Service: Orthopedics;  Laterality: Right;   AMPUTATION Left 08/13/2020   Procedure: LEFT FOOT 4TH RAY AMPUTATION;  Surgeon: [name], MD;  Location: [facility] OR;  Service: Orthopedics;  Laterality: Left;   AMPUTATION Left 11/20/2021   Procedure: LEFT TRANSMETATARSAL AMPUTATION;  Surgeon: [name], MD;  Location: [facility] OR;  Service: Orthopedics;  Laterality: Left;   AMPUTATION Left 01/08/2022   Procedure: LEFT BELOW KNEE AMPUTATION;  Surgeon: [name], MD;  Location: [facility] OR;  Service: Orthopedics;  Laterality: Left;   AMPUTATION Right 07/07/2022   Procedure: RIGHT BELOW KNEE AMPUTATION;  Surgeon: [name], MD;  Location: [facility] OR;  Service: Orthopedics;  Laterality: Right;   AORTOGRAM Bilateral 03/13/2021   Procedure: ABDOMINAL AORTOGRAM WITH Left LOWER EXTREMITY RUNOFF;  Surgeon: Leonie Douglas, MD;  Location: [facility] OR;  Service: Vascular;  Laterality: Bilateral;   APPLICATION OF WOUND VAC  01/08/2022   Procedure: APPLICATION OF WOUND VAC;  Surgeon: [name], MD;  Location: [facility] OR;  Service: Orthopedics;;   APPLICATION OF WOUND VAC Right 07/07/2022   Procedure: APPLICATION OF WOUND VAC;  Surgeon: [name], MD;  Location: [facility] OR;  Service: Orthopedics;  Laterality: Right;   CARDIAC CATHETERIZATION  2006   CARDIAC CATHETERIZATION  1996   "@ [facility]; when I had my heart attack"   CARDIAC CATHETERIZATION N/A 07/29/2016   Procedure: Left Heart Cath and Coronary Angiography;  Surgeon: Runell Gess, MD;  Location: [facility] INVASIVE CV LAB;  Service: Cardiovascular;   Laterality: N/A;   CARDIAC CATHETERIZATION N/A 07/29/2016   Procedure: Coronary Stent Intervention;  Surgeon: Runell Gess, MD;  [facility]: [facility];  Service: Cardiovascular;  Laterality: N/A;   CARPAL TUNNEL RELEASE Bilateral    CORONARY ANGIOPLASTY WITH STENT PLACEMENT  07/29/2016   CORONARY ARTERY BYPASS GRAFT N/A 07/24/2020   Procedure: CORONARY ARTERY BYPASS GRAFTING (CABG), ON PUMP, TIMES FOUR, USING LEFT INTERNAL MAMMARY ARTERY AND ENDOSCOPICALLY HARVESTED RIGHT GREATER SAPHENOUS VEIN;  Surgeon: Corliss Skains, MD;  Location: [facility] OR;  Service: Open Heart Surgery;  Laterality: N/A;  FLOW TAC   CORONARY PRESSURE/FFR STUDY N/A 07/22/2020   Procedure: INTRAVASCULAR PRESSURE WIRE/FFR STUDY;  Surgeon: Marykay Lex, MD;  Location: [facility] INVASIVE CV LAB;  Service: Cardiovascular;  Laterality: N/A;   CORONARY STENT INTERVENTION N/A 05/30/2019   Procedure: CORONARY STENT INTERVENTION;  Surgeon: Lyn Records, MD;  Location: [facility];  Service: Cardiovascular;  Laterality: N/A;   CORONARY STENT INTERVENTION N/A 11/02/2019   Procedure: CORONARY STENT INTERVENTION;  Surgeon: Iran Ouch, MD;  [facility]: [facility];  Service: Cardiovascular;  Laterality: N/A;   ESOPHAGOGASTRODUODENOSCOPY N/A 09/22/2017   Procedure: ESOPHAGOGASTRODUODENOSCOPY (EGD);  Surgeon: Meryl Dare, MD;  Location: [facility] ENDOSCOPY;  Service: Endoscopy;  Laterality: N/A;   KNEE ARTHROSCOPY Bilateral    "2 on left; 1 on the right"   LEFT HEART CATH AND CORONARY ANGIOGRAPHY N/A 11/02/2019   Procedure: LEFT HEART CATH AND CORONARY ANGIOGRAPHY;  Surgeon: Iran Ouch, MD;  [facility]: [facility];  Service: Cardiovascular;  Laterality: N/A;   LEFT HEART CATH AND CORONARY ANGIOGRAPHY N/A 07/22/2020   Procedure: LEFT HEART CATH AND CORONARY ANGIOGRAPHY;  Surgeon: Marykay Lex, MD;  Location: [facility] INVASIVE CV LAB;  Service: Cardiovascular;  Laterality: N/A;   LEFT HEART CATH AND CORS/GRAFTS  ANGIOGRAPHY N/A 08/17/2021   Procedure: LEFT HEART CATH AND CORS/GRAFTS ANGIOGRAPHY;  Surgeon: Yvonne Kendall, MD;  [facility]: [facility];  Service: Cardiovascular;  Laterality: N/A;   PERIPHERAL VASCULAR INTERVENTION Left 10/11/2020   popliteal and SFA stent placement    PERIPHERAL VASCULAR INTERVENTION Left 10/10/2020   Procedure: PERIPHERAL VASCULAR INTERVENTION;  Surgeon: Sherren Kerns, MD;  [facility]: [facility];  Service: Cardiovascular;  Laterality: Left;   RIGHT/LEFT HEART CATH AND CORONARY ANGIOGRAPHY N/A 05/30/2019   Procedure: RIGHT/LEFT HEART CATH AND CORONARY ANGIOGRAPHY;  Surgeon: Lyn Records, MD;  [facility]: [facility];  Service: Cardiovascular;  Laterality: N/A;   RIGHT/LEFT HEART CATH AND CORONARY/GRAFT ANGIOGRAPHY N/A 06/17/2022   Procedure: RIGHT/LEFT HEART CATH AND CORONARY/GRAFT ANGIOGRAPHY;  Surgeon: Lennette Bihari, MD;  [facility]: [facility];  Service: Cardiovascular;  Laterality: N/A;   SHOULDER OPEN ROTATOR CUFF REPAIR Bilateral    TEE WITHOUT CARDIOVERSION N/A  07/24/2020   Procedure: TRANSESOPHAGEAL ECHOCARDIOGRAM (TEE);  Surgeon: Corliss Skains, MD;  Location: [facility] OR;  Service: Open Heart Surgery;  Laterality: N/A;   Patient Active Problem List   Diagnosis Date Noted   AKI (acute kidney injury) (HCC) 04/29/2023   Weakness 04/28/2023   Nephrosis 04/27/2023   CHF (congestive heart failure) (HCC) 04/26/2023   OSA (obstructive sleep apnea) 04/26/2023   Preoperative cardiovascular examination 04/25/2023   Hx of iron deficiency anemia 07/07/2022   Syncope and collapse 06/15/2022   AMS (altered mental status) 04/14/2022   Acute respiratory failure with hypoxia (HCC) 04/14/2022   Acute metabolic encephalopathy 04/14/2022   Pneumonia 04/14/2022   Ulcer of right foot, limited to breakdown of skin (HCC)    Below-knee amputation of left lower extremity (HCC)    Osteomyelitis of foot, right, acute (HCC) 01/06/2022   Bipolar disorder  with depression (HCC)    Sepsis (HCC) 11/18/2021   Chest pain 11/18/2021   Dyspnea 11/18/2021   Wound infection    Ulcer of left foot with necrosis of bone (HCC)    Bicuspid aortic valve 09/14/2021   Aneurysm of ascending aorta (HCC) 09/14/2021   DM (diabetes mellitus) with peripheral vascular complication (HCC) 08/20/2021   Atypical chest pain 07/17/2021   Hyponatremia 07/17/2021   Leg wound, left 07/17/2021   Essential hypertension 07/17/2021   Acute hyperglycemia 07/09/2021   Non-ST elevation (NSTEMI) myocardial infarction (HCC) 07/22/2020   Heart failure with preserved ejection fraction (HCC) 07/22/2020   Hypotension 07/18/2020   Elevated troponin 07/18/2020   Acute renal failure superimposed on stage 3a chronic kidney disease (HCC) 07/18/2020   Elevated brain natriuretic peptide (BNP) level 07/18/2020   Edema 06/13/2019   Leukocytosis 05/31/2019   Smoker 03/08/2019   Chronic back pain 02/16/2019   Deep venous thrombosis (HCC) 02/16/2019   Obesity, Class III, BMI 40-49.9 (morbid obesity) (HCC) 02/16/2019   Peripheral nerve disease 02/16/2019   COPD (chronic obstructive pulmonary disease) (HCC) 01/22/2019   Anxiety and depression 03/26/2018   Sinusitis 10/12/2017   Abdominal pain    Loss of weight    Diabetic foot infection (HCC) 09/16/2017   Cellulitis of right foot    Recurrent chest pain 07/29/2016   Hyperglycemia    Insulin dependent type 2 diabetes mellitus (HCC) 04/29/2015   History of pulmonary embolus (PE) 04/27/2013   CAD (coronary artery disease) 09/10/2008    ONSET DATE: 04/27/2023  REFERRING DIAG: R53.1 (ICD-10-CM) - Weakness  THERAPY DIAG:  Muscle weakness (generalized)  Other abnormalities of gait and mobility  Unsteadiness on feet  Rationale for Evaluation and Treatment: Rehabilitation  SUBJECTIVE:  SUBJECTIVE STATEMENT: Patient states he got about 3 hours of sleep last night and has not eaten today (he denies snack offered by PT).  As a result he is in pain all over.  Denies falls.  Blood sugar is 201 at onset of session.  He canceled appt with [facility] because he was sick so no adjustments have been made to prosthetic to address varus on left knee.  Patient states he does not feel up to standing and walking today, but would like to do the bike.  Pt accompanied by: self  PERTINENT HISTORY:  PMH signficant for AAA aneurysm, anxiety, asthma, CAD, COPD, DM, DVT, HNT, migraine, morbid obesity, MI, PE, peripheral nerve disease, B BKA with prosthetics, CABG, bilateral shoulder open rotator cuff repair.  PAIN:  Are you having pain? Yes: NPRS scale: 8/10 Pain location: all over Pain description: achy  PRECAUTIONS: Fall and Other: B BKA  WEIGHT BEARING RESTRICTIONS: No  FALLS: Has patient fallen in last 6 months? Yes. Number of falls 2-3; per pt was not injured during fall; per pt he can get back up by himself  LIVING ENVIRONMENT: Lives with: lives with their spouse Lives in: House/apartment Stairs: No Has following equipment at home: Environmental consultant - 2 wheeled, Wheelchair (power), Wheelchair (manual), and Ramped entry  PLOF: Independent with gait, Independent with transfers, and Requires assistive device for independence  PATIENT GOALS: "to walk"  OBJECTIVE:   DIAGNOSTIC FINDINGS:  None relevant to this POC  COGNITION: Overall cognitive status:  decreased memory per patient report   SENSATION: Impaired due to neuropathy in residual limbs  POSTURE: rounded shoulders, forward head, and posterior pelvic tilt  LOWER EXTREMITY ROM:   *not assessed at eval  Active  Right Eval Left Eval  Hip flexion    Hip extension    Hip abduction    Hip adduction    Hip internal rotation    Hip external rotation    Knee flexion    Knee extension    Ankle  dorsiflexion    Ankle plantarflexion    Ankle inversion    Ankle eversion     (Blank rows = not tested)  LOWER EXTREMITY MMT:  *not assessed at eval  MMT Right Eval Left Eval  Hip flexion    Hip extension    Hip abduction    Hip adduction    Hip internal rotation    Hip external rotation    Knee flexion    Knee extension    Ankle dorsiflexion    Ankle plantarflexion    Ankle inversion    Ankle eversion    (Blank rows = not tested)  VITALS  There were no vitals filed for this visit.   Blood glucose: not assessed today   TODAY'S TREATMENT:      -Patient transfers to SciFit stand step to right using hand support on wheelchair and seat of stepper w/o AD (per pt request) -SciFit in hill mode x10 minutes progressing to level 6.0 with periods of just BLE pedaling vs BLE/BUE for endurance and limb volume management.  Required 2 rest breaks of several minutes due to right shoulder "drilling" pain and medial left socket pressure.  Patient working at a 6/10 level midway through task.  He maintains approximately a 100-110 steps/min pace with each portion of task.  He reports discomfort with fit of left socket, but requests to finish task. -PT briefly assessed right shoulder pain during rest break, pt is pinpoint tender over supraspinatus and glenohumeral joint line  superiorly.  He states orthopedic MD is aware and has told him that as long as he is using a RW there is not much they can do because it will continue to be irritated by repeated UE use. -Pt performs lateral scoot SBA to left from SciFit to PWC. -Discussed need for ongoing skin checks following any activity due to rubbing and possible skin breakdown with increased medial knee pressure of left socket.  PT visually assessed:  diffuse ~5inch area of inner mid-thigh on the left that is pink from friction, no open areas.  PATIENT EDUCATION: Education details: Call and reschedule [facility] appt.  Continue sink HEP.  Continue skin checks  specifically to red area of left knee - should disappear after around 20 minutes without irritant if no wound is forming.  If a wound forms leave prosthetic off until adjustment can be made. Person educated: Patient Education method: Explanation and Handouts Education comprehension: verbalized understanding and needs further education  HOME EXERCISE PROGRAM: Do each exercise 1-2  times per day Do each exercise 10 repetitions Hold each exercise for 2 seconds to feel your location  AT SINK FIND YOUR MIDLINE POSITION AND PLACE FEET EQUAL DISTANCE FROM THE MIDLINE.  Try to find this position when standing still for activities.   USE TAPE ON FLOOR TO MARK THE MIDLINE POSITION. You also should try to feel with your limb pressure in socket.  You are trying to feel with limb what you used to feel with the bottom of your foot.  Side to Side Shift: Moving your hips only (not shoulders): move weight onto your left leg, HOLD/FEEL.  Move back to equal weight on each leg, HOLD/FEEL. Move weight onto your right leg, HOLD/FEEL. Move back to equal weight on each leg, HOLD/FEEL. Repeat.  Start with both hands on sink, progress to right hand only, then no hands.  Front to Back Shift: Moving your hips only (not shoulders): move your weight forward onto your toes, HOLD/FEEL. Move your weight back to equal Flat Foot on both legs, HOLD/FEEL. Move your weight back onto your heels, HOLD/FEEL. Move your weight back to equal on both legs, HOLD/FEEL. Repeat.   tart with both hands on sink, progress to right hand only, then no hands. Moving Cones / Cups: With equal weight on each leg: Hold on with one hand the first time, then progress to no hand supports. Move cups from one side of sink to the other. Place cups ~2" out of your reach, progress to 10" beyond reach.  Place one hand in middle of sink and reach with other hand. Do both arms.  Then hover one hand and move cups with other hand.  Overhead/Upward Reaching: alternated  reaching up to top cabinets or ceiling if no cabinets present. Keep equal weight on each leg. Start with one hand support on counter while other hand reaches and progress to no hand support with reaching.  ace one hand in middle of sink and reach with other hand. Do both arms.  Then hover one hand and move cups with other hand.  5.   Looking Over Shoulders: With equal weight on each leg: alternate turning to look over your shoulders with one hand support on counter as needed.  Start with head motions only to look in front of shoulder, then even with shoulder and progress to looking behind you. To look to side, move head /eyes, then shoulder on side looking pulls back, shift more weight to side looking and pull hip  back. ace one hand in middle of sink and let go with other hand so your shoulder can pull back. Switch hands to look other way.   Then hover one hand and move cups with other hand.  6.  Stepping with leg :  Move items under cabinet out of your way. Shift your hips/pelvis so weight on prosthesis. SLOWLY step other leg so front of foot is in cabinet. Then step back to floor.    GOALS: Goals reviewed with patient? Yes  SHORT TERM GOALS: Target date: 06/25/2023  Pt will be independent with initial HEP for improved strength, balance, transfers and gait. Baseline: Goal status: IN PROGRESS  2.  Pt will perform sit to stand transfers w/SBA and LRAD for improved independence and BLE strength Baseline: CGA Goal status: MET  3. Pt will ascend/descend ramp w/LRAD and CGA for improved safety w/household ambulation  Baseline: not assessed - pt has video of ramp navigation using rails Goal status: IN PROGRESS  4.  Pt to demonstrate compliance with BLE prosthetic wear schedule and residual limb skin checks as well as residual limb measurement  Baseline: provided education on 7/5 Goal status: IN PROGRESS   LONG TERM GOALS: Target date: 08/19/2023 (updated to match cert date)    Pt will be  independent with final HEP for improved strength, balance, transfers and gait. Baseline:  Goal status: IN PROGRESS  2.  Pt will be mod I w/ all transfers w/LRAD for improved independence and safety w/prosthetics  Baseline: CGA; SBA on 8/9 Goal status: IN PROGRESS  3.  Pt will ambulate >200' on level surfaces w/LRAD and SBA for improved endurance, safety w/prosthetics and strength  Baseline:  Goal status: IN PROGRESS  4.  Pt will ascend/descend ramp w/LRAD and CGA for improved safety w/household ambulation  Baseline: unable to assess in clinic due to safety concerns w/prosthetics- pt has video of ramp navigation using rails Goal status: IN PROGRESS   ASSESSMENT:  CLINICAL IMPRESSION: Session limited by fatigue and pain today.  Patient had a rough night with a lack of sleep and has not eaten today.  His left socket fit remains a problem and he has yet to reschedule his [facility] appt to address this.  He completed 10 minutes total on the SciFit, but needed breaks due to fatigue.  He was able to maintain good pacing throughout task.  Will attempt to progress standing and ambulatory tolerance at next session if patient feeling better and prosthetic adjustments have been made.  OBJECTIVE IMPAIRMENTS: decreased balance, decreased endurance, decreased knowledge of use of DME, decreased mobility, difficulty walking, prosthetic dependency , and obesity.   ACTIVITY LIMITATIONS: standing, squatting, and stairs  PARTICIPATION LIMITATIONS: driving and community activity  PERSONAL FACTORS: Fitness, Transportation, and 3+ comorbidities:    AAA aneurysm, anxiety, asthma, CAD, COPD, DM, DVT, HNT, migraine, morbid obesity, MI, PE, peripheral nerve disease, B BKA with prosthetics, CABG, bilateral shoulder open rotator cuff repair.are also affecting patient's functional outcome.   REHAB POTENTIAL: Good  CLINICAL DECISION MAKING: Stable/uncomplicated  EVALUATION COMPLEXITY: Moderate  PLAN:  PT  FREQUENCY: 2x/week  PT DURATION: 8 weeks  PLANNED INTERVENTIONS: Therapeutic exercises, Therapeutic activity, Neuromuscular re-education, Balance training, Gait training, Patient/Family education, Self Care, Prosthetic training, DME instructions, Wheelchair mobility training, scar mobilization, Manual therapy, and Re-evaluation  PLAN FOR NEXT SESSION:  Did he bring socks? gait due to instability of L BKA prosthetic. Scifit for endurance, gait training and activities working on balance, was [facility] able to make adjustments already  or did he at least reschedule [facility] appt?  *Called Bobby on 8/9 and was told that at [facility], pt's residual limb too large to fully assess what is happening in socket. Informed Bobby of excessive varus position of L knee and need to make changes. Also informed him that pt has not been wearing shrinker at night and pt may need new one. Pt aware that he needs to be wearing shrinker.    Sadie Haber, PT, DPT  08/08/2023, 6:47 PM

## 2023-08-11 ENCOUNTER — Telehealth: Payer: Self-pay | Admitting: Physical Therapy

## 2023-08-11 ENCOUNTER — Ambulatory Visit: Payer: Medicare HMO | Admitting: Physical Therapy

## 2023-08-11 NOTE — Telephone Encounter (Signed)
Called and LVM; this is patient's second no show. Reminded of no-show/cancellation policy as well as next appointment. Continue POC.   Maryruth Eve, PT, DPT

## 2023-08-16 ENCOUNTER — Ambulatory Visit: Payer: Medicare HMO | Admitting: Physical Therapy

## 2023-08-16 ENCOUNTER — Encounter: Payer: Self-pay | Admitting: Physical Therapy

## 2023-08-16 NOTE — Therapy (Signed)
[facility] [address] [place], [name], [address] Phone: [phone]   Fax:  [phone]  Patient Details  Name: Joseph [name] MRN: [id number] Date of Birth: 11-25-1971 Referring Provider:  No ref. provider found  Encounter Date: 08/16/2023  PHYSICAL THERAPY DISCHARGE SUMMARY  Visits from Start of Care: 9  Current functional level related to goals / functional outcomes: Pt relies on power chair for mobility    Remaining deficits: Bilateral BKA    Education / Equipment: HEP, pt to make appointment w/[facility] to have prosthetics fixed prior to receiving new referral and return to PT   Patient agrees to discharge. Patient goals were  DC . Patient is being discharged due to not returning since the last visit. Called pt and informed him of DC, pt verbalized understanding   Derrion Tritz E Preciosa Bundrick, PT 08/16/2023, 2:44 PM  [facility] [address] [place], [name], [address] Phone: [phone]   Fax:  [phone]

## 2023-08-18 ENCOUNTER — Ambulatory Visit: Payer: Medicare HMO | Admitting: Physical Therapy

## 2023-08-24 ENCOUNTER — Encounter: Payer: Medicare HMO | Admitting: Physical Therapy

## 2023-08-26 ENCOUNTER — Encounter: Payer: Medicare HMO | Admitting: Physical Therapy

## 2023-08-30 ENCOUNTER — Encounter: Payer: Medicare HMO | Admitting: Physical Therapy

## 2023-09-01 ENCOUNTER — Encounter: Payer: Medicare HMO | Admitting: Physical Therapy

## 2023-09-06 ENCOUNTER — Encounter: Payer: Medicare HMO | Admitting: Physical Therapy

## 2023-09-08 ENCOUNTER — Encounter: Payer: Medicare HMO | Admitting: Physical Therapy

## 2023-09-13 ENCOUNTER — Encounter: Payer: Medicare HMO | Admitting: Physical Therapy

## 2023-09-15 ENCOUNTER — Encounter: Payer: Medicare HMO | Admitting: Physical Therapy

## 2023-09-20 ENCOUNTER — Encounter: Payer: Medicare HMO | Admitting: Physical Therapy

## 2023-09-22 ENCOUNTER — Encounter: Payer: Medicare HMO | Admitting: Physical Therapy

## 2023-10-01 ENCOUNTER — Emergency Department (HOSPITAL_COMMUNITY)
Admission: EM | Admit: 2023-10-01 | Discharge: 2023-10-01 | Disposition: A | Discharge status: Home / Self Care | Payer: Medicare HMO | Attending: Emergency Medicine | Admitting: Emergency Medicine

## 2023-10-01 ENCOUNTER — Emergency Department (HOSPITAL_COMMUNITY): Payer: Medicare HMO

## 2023-10-01 ENCOUNTER — Other Ambulatory Visit: Payer: Self-pay

## 2023-10-01 ENCOUNTER — Encounter (HOSPITAL_COMMUNITY): Payer: Self-pay

## 2023-10-01 DIAGNOSIS — M25511 Pain in right shoulder: Secondary | ICD-10-CM | POA: Diagnosis not present

## 2023-10-01 DIAGNOSIS — R519 Headache, unspecified: Secondary | ICD-10-CM | POA: Insufficient documentation

## 2023-10-01 DIAGNOSIS — Z7982 Long term (current) use of aspirin: Secondary | ICD-10-CM | POA: Diagnosis not present

## 2023-10-01 DIAGNOSIS — I2510 Atherosclerotic heart disease of native coronary artery without angina pectoris: Secondary | ICD-10-CM | POA: Diagnosis not present

## 2023-10-01 DIAGNOSIS — W19XXXA Unspecified fall, initial encounter: Secondary | ICD-10-CM | POA: Insufficient documentation

## 2023-10-01 DIAGNOSIS — Z79899 Other long term (current) drug therapy: Secondary | ICD-10-CM | POA: Insufficient documentation

## 2023-10-01 DIAGNOSIS — Z7902 Long term (current) use of antithrombotics/antiplatelets: Secondary | ICD-10-CM | POA: Diagnosis not present

## 2023-10-01 DIAGNOSIS — S0990XA Unspecified injury of head, initial encounter: Secondary | ICD-10-CM | POA: Insufficient documentation

## 2023-10-01 DIAGNOSIS — Z794 Long term (current) use of insulin: Secondary | ICD-10-CM | POA: Insufficient documentation

## 2023-10-01 MED ORDER — HYDROCODONE-ACETAMINOPHEN 5-325 MG PO TABS
1.0000 | ORAL_TABLET | ORAL | Status: AC
  Administered 2023-10-01: 1 via ORAL
  Filled 2023-10-01: qty 1

## 2023-10-01 NOTE — Discharge Instructions (Signed)
You were seen for your shoulder injury in the emergency department.   At home, please wear the sling we gave you for comfort.    Check your MyChart online for the results of any tests that had not resulted by the time you left the emergency department.   Follow-up with your orthopedic doctor in 1 week regarding your visit.    Return immediately to the emergency department if you experience any of the following: worsening pain, new numbness or weakness of your hand, or any other concerning symptoms.    Thank you for visiting our Emergency Department. It was a pleasure taking care of you today.

## 2023-10-01 NOTE — ED Triage Notes (Signed)
Fall 1 hour ago B/L BKA  RIGHT shoulder pain Pt did hit head Pt is on plavix  EMS vitals 148/78 96HR 99%RA BGL 204

## 2023-10-01 NOTE — ED Notes (Signed)
ED Provider at bedside. 

## 2023-10-01 NOTE — ED Notes (Signed)
New HA post fall

## 2023-10-01 NOTE — ED Provider Notes (Signed)
[facility] EMERGENCY DEPARTMENT AT [facility] Provider Note   CSN: [id number] Arrival date & time: 10/01/23  1559     History {Add pertinent medical, surgical, social history, OB history to HPI:1} Chief Complaint  Patient presents with   Joseph [name] is a 52 y.o. male.  52 year old male with a history of bilateral BKA's, CAD status post PCI, DVT, PE not currently on anticoagulation who presents emergency department after a fall.  Patient was putting his prosthetic legs on and the right 1 fell off.  Fell onto his right shoulder.  Did hit his head.  Did not lose consciousness.  Is having a headache but no nausea or vomiting afterwards.  Is also having right shoulder pain that is severe.  Had a history of right shoulder surgery where they removed a portion of his clavicle.  He is taking aspirin and Plavix but no other blood thinners.       Home Medications Prior to Admission medications   Medication Sig Start Date End Date Taking? Authorizing Provider  acetaminophen (TYLENOL) 500 MG tablet Take 1,000 mg by mouth 3 (three) times daily as needed for headache.    [provider]  albuterol (VENTOLIN HFA) 108 (90 Base) MCG/ACT inhaler Inhale 2 puffs into the lungs every 6 (six) hours as needed for shortness of breath.    [provider]  ALPRAZolam Prudy Feeler) 1 MG tablet Take 2 mg by mouth at bedtime.    [provider]  aspirin EC 81 MG tablet Take 81 mg by mouth in the morning. Swallow whole.    [provider]  atorvastatin (LIPITOR) 80 MG tablet Take 1 tablet (80 mg total) by mouth daily at 6 PM. 05/31/19   Corrin Parker, PA-C  buPROPion (WELLBUTRIN XL) 150 MG 24 hr tablet Take 150 mg by mouth in the morning and at bedtime.    [provider]  carvedilol (COREG) 6.25 MG tablet Take 1 tablet (6.25 mg total) by mouth 2 (two) times daily. 05/03/23 05/02/24  Dameron, Nolberto Hanlon, DO  clopidogrel (PLAVIX) 75 MG tablet Take 1  tablet (75 mg total) by mouth daily with breakfast. 10/11/20   Lars Mage, PA-C  Continuous Blood Gluc Sensor (FREESTYLE LIBRE 14 DAY SENSOR) MISC Apply topically 4 (four) times daily. 09/23/22   [provider]  dapagliflozin propanediol (FARXIGA) 10 MG TABS tablet Take 1 tablet (10 mg total) by mouth daily. 05/04/23   Dameron, Nolberto Hanlon, DO  escitalopram (LEXAPRO) 10 MG tablet Take 10 mg by mouth daily. 01/27/22   [provider]  fluconazole (DIFLUCAN) 100 MG tablet Take 1 tablet by mouth once a week.    [provider]  fluticasone (FLONASE) 50 MCG/ACT nasal spray Place 2 sprays into both nostrils daily.    [provider]  icosapent Ethyl (VASCEPA) 1 g capsule Take 2 capsules (2 g total) by mouth 2 (two) times daily. 09/02/21   Chilton Si, MD  insulin aspart (NOVOLOG) 100 UNIT/ML injection Inject 3 Units into the skin 3 (three) times daily with meals. 07/13/22   Dahal, Melina Schools, MD  insulin glargine (LANTUS SOLOSTAR) 100 UNIT/ML Solostar Pen Inject 35 Units into the skin 2 (two) times daily. 05/03/23   Dameron, Nolberto Hanlon, DO  Insulin Pen Needle 32G X 4 MM MISC Use as directed with insulin pens 05/03/23   Dameron, Nolberto Hanlon, DO  loratadine (CLARITIN) 10 MG tablet Take 10 mg by mouth daily. 09/27/22   [provider]  metFORMIN (GLUCOPHAGE) 1000 MG tablet Take 1 tablet (1,000 mg total) by mouth 2 (two) times daily with a meal. 11/05/19   Leone Brand, NP  naloxone [facility]) nasal spray 4 mg/0.1 mL Place 4 mg into the nose as needed (overdose).    [provider]  nitroGLYCERIN (NITROSTAT) 0.4 MG SL tablet Place 1 tablet (0.4 mg total) under the tongue every 5 (five) minutes as needed. Patient taking differently: Place 0.4 mg under the tongue every 5 (five) minutes as needed for chest pain. 10/08/22   Gaston Islam., NP  oxyCODONE (ROXICODONE) 15 MG immediate release tablet Take 1 tablet (15 mg total) by mouth every 4 (four) hours as needed for  moderate pain (pain score 4-6). 07/13/22   Lorin Glass, MD  pantoprazole (PROTONIX) 40 MG tablet Take 1 tablet (40 mg total) by mouth daily. 09/23/17   Hongalgi, Maximino Greenland, MD  pregabalin (LYRICA) 150 MG capsule Take 150 mg by mouth 3 (three) times daily. 09/10/22   [provider]  promethazine (PHENERGAN) 25 MG tablet Take 25 mg by mouth every 4 (four) hours as needed for nausea or vomiting. 09/10/22   [provider]  ranolazine (RANEXA) 500 MG 12 hr tablet TAKE 1 TABLET(500 MG) BY MOUTH TWICE DAILY Patient taking differently: Take 500 mg by mouth 2 (two) times daily. 10/01/22   Bhagat, Sharrell Ku, PA  torsemide (DEMADEX) 20 MG tablet Take 3 tablets (60 mg total) by mouth daily. 05/04/23   Dameron, Nolberto Hanlon, DO  Ubrogepant (UBRELVY) 100 MG TABS Take 1 tablet by mouth daily as needed (For migraine). 06/30/22   [provider]      Allergies    Sulfa antibiotics    Review of Systems   Review of Systems  Physical Exam Updated Vital Signs BP 115/83 (BP Location: Left Arm)   Pulse 88   Temp 98 F (36.7 C)   Resp 19   Ht 6\' 5"  (1.956 m)   Wt (!) 145.2 kg   SpO2 96%   BMI 37.95 kg/m  Physical Exam Vitals and nursing note reviewed.  Constitutional:      General: He is not in acute distress.    Appearance: He is well-developed.  HENT:     Head: Normocephalic and atraumatic.     Right Ear: External ear normal.     Left Ear: External ear normal.     Nose: Nose normal.  Eyes:     Extraocular Movements: Extraocular movements intact.     Conjunctiva/sclera: Conjunctivae normal.     Pupils: Pupils are equal, round, and reactive to light.  Cardiovascular:     Rate and Rhythm: Normal rate and regular rhythm.     Heart sounds: Normal heart sounds.  Pulmonary:     Effort: Pulmonary effort is normal. No respiratory distress.     Breath sounds: Normal breath sounds.  Musculoskeletal:     Cervical back: Normal range of motion and neck supple.     Right lower leg: No  edema.     Left lower leg: No edema.     Comments: Tenderness palpation of anterior, posterior, and lateral right shoulder.  Intact sensation to light touch of the right deltoid and anterior and posterior aspects of the upper arm.  Intact sensation of the hand and medial, radian, and ulnar distribution.  Radial pulse 2+.  Able to make fist, fully open hand, okay sign, and give thumbs up.  No significant snuffbox tenderness palpation  Skin:  General: Skin is warm and dry.  Neurological:     Mental Status: He is alert. Mental status is at baseline.  Psychiatric:        Mood and Affect: Mood normal.        Behavior: Behavior normal.     ED Results / Procedures / Treatments   Labs (all labs ordered are listed, but only abnormal results are displayed) Labs Reviewed - No data to display  EKG None  Radiology No results found.  Procedures Procedures  {Document cardiac monitor, telemetry assessment procedure when appropriate:1}  Medications Ordered in ED Medications - No data to display  ED Course/ Medical Decision Making/ A&P   {   Click here for ABCD2, HEART and other calculatorsREFRESH Note before signing :1}                              Medical Decision Making Amount and/or Complexity of Data Reviewed Radiology: ordered.   ***  {Document critical care time when appropriate:1} {Document review of labs and clinical decision tools ie heart score, Chads2Vasc2 etc:1}  {Document your independent review of radiology images, and any outside records:1} {Document your discussion with family members, caretakers, and with consultants:1} {Document social determinants of health affecting pt's care:1} {Document your decision making why or why not admission, treatments were needed:1} Final Clinical Impression(s) / ED Diagnoses Final diagnoses:  None    Rx / DC Orders ED Discharge Orders     None

## 2023-10-06 ENCOUNTER — Ambulatory Visit: Payer: Medicare HMO | Admitting: Endocrinology

## 2023-10-25 ENCOUNTER — Other Ambulatory Visit: Payer: Self-pay

## 2023-10-25 ENCOUNTER — Ambulatory Visit: Payer: Medicare HMO | Attending: Family Medicine | Admitting: Physical Therapy

## 2023-10-25 ENCOUNTER — Encounter: Payer: Self-pay | Admitting: Physical Therapy

## 2023-10-25 VITALS — BP 113/86 | HR 109

## 2023-10-25 DIAGNOSIS — R2681 Unsteadiness on feet: Secondary | ICD-10-CM | POA: Insufficient documentation

## 2023-10-25 DIAGNOSIS — M25551 Pain in right hip: Secondary | ICD-10-CM | POA: Insufficient documentation

## 2023-10-25 DIAGNOSIS — M6281 Muscle weakness (generalized): Secondary | ICD-10-CM | POA: Diagnosis present

## 2023-10-25 DIAGNOSIS — R2689 Other abnormalities of gait and mobility: Secondary | ICD-10-CM | POA: Insufficient documentation

## 2023-10-25 DIAGNOSIS — M25552 Pain in left hip: Secondary | ICD-10-CM | POA: Diagnosis present

## 2023-10-25 DIAGNOSIS — M62838 Other muscle spasm: Secondary | ICD-10-CM | POA: Insufficient documentation

## 2023-10-25 DIAGNOSIS — R29898 Other symptoms and signs involving the musculoskeletal system: Secondary | ICD-10-CM | POA: Diagnosis present

## 2023-10-25 DIAGNOSIS — G546 Phantom limb syndrome with pain: Secondary | ICD-10-CM | POA: Diagnosis present

## 2023-10-25 NOTE — Therapy (Signed)
OUTPATIENT PHYSICAL THERAPY NEURO EVALUATION   Patient Name: Joseph [name] MRN: [id number] DOB:[birth year], 52 y.o., male Today's Date: 10/26/2023   PCP: Lucile Crater FNP-BC ([facility]) REFERRING PROVIDER: Patrick Jupiter, NP  END OF SESSION:  PT End of Session - 10/25/23 1509     Visit Number 1    Number of Visits 17    Date for PT Re-Evaluation 12/30/23   pushed out due to scheduling delay   Authorization Type [employer]    Progress Note Due on Visit 10    PT Start Time 1450    PT Stop Time 1540    PT Time Calculation (min) 50 min    Equipment Utilized During Treatment Gait belt    Behavior During Therapy WFL for tasks assessed/performed             Past Medical History:  Diagnosis Date   Aneurysm of ascending aorta (HCC) 09/14/2021   Anxiety    Arthritis    "knees, shoulders, hips, ankles" (07/29/2016)   Asthma    Bicuspid aortic valve 09/14/2021   CAD (coronary artery disease)    a. 2017: s/p BMS to distal Cx; b. LHC 05/30/2019: 80% mid, distal RCA s/p DES, 30% narrowing of d LM, widely patent LAD w/ luminal irregularities, widely patent stent in dCX  w/ 90+% stenosis distal to stent beofre small trifurcating obtuse marginal (potentially area of restenosis)   Chronic bronchitis (HCC)    Chronic ulcer of great toe of right foot (HCC) 09/09/2017   Chronic ulcer of right great toe (HCC) 09/19/2017   COPD (chronic obstructive pulmonary disease) (HCC)    Coronary arteriosclerosis    Depression    Diabetes mellitus (HCC)    DVT (deep venous thrombosis) (HCC)    GERD (gastroesophageal reflux disease)    Hyperlipidemia    Hypertension    Migraine    Morbid obesity (HCC)    Myocardial infarction (HCC) 1996   "light one"   PE (pulmonary embolism) 04/2013   On chronic Xarelto   Peripheral nerve disease    Pneumonia "several times"   Sleep apnea    Type II diabetes mellitus (HCC)    Past Surgical History:  Procedure Laterality Date   ABDOMINAL AORTOGRAM  W/LOWER EXTREMITY N/A 10/10/2020   Procedure: ABDOMINAL AORTOGRAM W/LOWER EXTREMITY;  Surgeon: Sherren Kerns, MD;  Location: [facility];  Service: Cardiovascular;  Laterality: N/A;   AMPUTATION Right 09/21/2017   Procedure: RIGHT GREAT TOE AMPUTATION, POSSIBLE VAC;  Surgeon: Tarry Kos, MD;  Location: [facility] OR;  Service: Orthopedics;  Laterality: Right;   AMPUTATION Left 08/13/2020   Procedure: LEFT FOOT 4TH RAY AMPUTATION;  Surgeon: [name], MD;  Location: [facility] OR;  Service: Orthopedics;  Laterality: Left;   AMPUTATION Left 11/20/2021   Procedure: LEFT TRANSMETATARSAL AMPUTATION;  Surgeon: [name], MD;  Location: [facility] OR;  Service: Orthopedics;  Laterality: Left;   AMPUTATION Left 01/08/2022   Procedure: LEFT BELOW KNEE AMPUTATION;  Surgeon: [name], MD;  Location: [facility] OR;  Service: Orthopedics;  Laterality: Left;   AMPUTATION Right 07/07/2022   Procedure: RIGHT BELOW KNEE AMPUTATION;  Surgeon: [name], MD;  Location: [facility] OR;  Service: Orthopedics;  Laterality: Right;   AORTOGRAM Bilateral 03/13/2021   Procedure: ABDOMINAL AORTOGRAM WITH Left LOWER EXTREMITY RUNOFF;  Surgeon: Leonie Douglas, MD;  Location: [facility] OR;  Service: Vascular;  Laterality: Bilateral;   APPLICATION OF WOUND VAC  01/08/2022   Procedure: APPLICATION  OF WOUND VAC;  Surgeon: [name], MD;  Location: [facility] OR;  Service: Orthopedics;;   APPLICATION OF WOUND VAC Right 07/07/2022   Procedure: APPLICATION OF WOUND VAC;  Surgeon: [name], MD;  Location: [facility] OR;  Service: Orthopedics;  Laterality: Right;   CARDIAC CATHETERIZATION  2006   CARDIAC CATHETERIZATION  1996   "@ [facility]; when I had my heart attack"   CARDIAC CATHETERIZATION N/A 07/29/2016   Procedure: Left Heart Cath and Coronary Angiography;  Surgeon: Runell Gess, MD;  Location: [facility] INVASIVE CV LAB;  Service: Cardiovascular;  Laterality: N/A;   CARDIAC CATHETERIZATION N/A 07/29/2016   Procedure: Coronary Stent Intervention;  Surgeon:  Runell Gess, MD;  Location: [facility];  Service: Cardiovascular;  Laterality: N/A;   CARPAL TUNNEL RELEASE Bilateral    CORONARY ANGIOPLASTY WITH STENT PLACEMENT  07/29/2016   CORONARY ARTERY BYPASS GRAFT N/A 07/24/2020   Procedure: CORONARY ARTERY BYPASS GRAFTING (CABG), ON PUMP, TIMES FOUR, USING LEFT INTERNAL MAMMARY ARTERY AND ENDOSCOPICALLY HARVESTED RIGHT GREATER SAPHENOUS VEIN;  Surgeon: Corliss Skains, MD;  Location: [facility] OR;  Service: Open Heart Surgery;  Laterality: N/A;  FLOW TAC   CORONARY PRESSURE/FFR STUDY N/A 07/22/2020   Procedure: INTRAVASCULAR PRESSURE WIRE/FFR STUDY;  Surgeon: Marykay Lex, MD;  Location: [facility] INVASIVE CV LAB;  Service: Cardiovascular;  Laterality: N/A;   CORONARY STENT INTERVENTION N/A 05/30/2019   Procedure: CORONARY STENT INTERVENTION;  Surgeon: Lyn Records, MD;  Location: [facility];  Service: Cardiovascular;  Laterality: N/A;   CORONARY STENT INTERVENTION N/A 11/02/2019   Procedure: CORONARY STENT INTERVENTION;  Surgeon: Iran Ouch, MD;  Location: [facility];  Service: Cardiovascular;  Laterality: N/A;   ESOPHAGOGASTRODUODENOSCOPY N/A 09/22/2017   Procedure: ESOPHAGOGASTRODUODENOSCOPY (EGD);  Surgeon: Meryl Dare, MD;  Location: [facility] ENDOSCOPY;  Service: Endoscopy;  Laterality: N/A;   KNEE ARTHROSCOPY Bilateral    "2 on left; 1 on the right"   LEFT HEART CATH AND CORONARY ANGIOGRAPHY N/A 11/02/2019   Procedure: LEFT HEART CATH AND CORONARY ANGIOGRAPHY;  Surgeon: Iran Ouch, MD;  Location: [facility];  Service: Cardiovascular;  Laterality: N/A;   LEFT HEART CATH AND CORONARY ANGIOGRAPHY N/A 07/22/2020   Procedure: LEFT HEART CATH AND CORONARY ANGIOGRAPHY;  Surgeon: Marykay Lex, MD;  Location: [facility] INVASIVE CV LAB;  Service: Cardiovascular;  Laterality: N/A;   LEFT HEART CATH AND CORS/GRAFTS ANGIOGRAPHY N/A 08/17/2021   Procedure: LEFT HEART CATH AND CORS/GRAFTS ANGIOGRAPHY;  Surgeon: Yvonne Kendall, MD;  Location: [facility];  Service: Cardiovascular;  Laterality: N/A;   PERIPHERAL VASCULAR INTERVENTION Left 10/11/2020   popliteal and SFA stent placement    PERIPHERAL VASCULAR INTERVENTION Left 10/10/2020   Procedure: PERIPHERAL VASCULAR INTERVENTION;  Surgeon: Sherren Kerns, MD;  Location: [facility];  Service: Cardiovascular;  Laterality: Left;   RIGHT/LEFT HEART CATH AND CORONARY ANGIOGRAPHY N/A 05/30/2019   Procedure: RIGHT/LEFT HEART CATH AND CORONARY ANGIOGRAPHY;  Surgeon: Lyn Records, MD;  Location: [facility];  Service: Cardiovascular;  Laterality: N/A;   RIGHT/LEFT HEART CATH AND CORONARY/GRAFT ANGIOGRAPHY N/A 06/17/2022   Procedure: RIGHT/LEFT HEART CATH AND CORONARY/GRAFT ANGIOGRAPHY;  Surgeon: Lennette Bihari, MD;  Location: [facility];  Service: Cardiovascular;  Laterality: N/A;   SHOULDER OPEN ROTATOR CUFF REPAIR Bilateral    TEE WITHOUT CARDIOVERSION N/A 07/24/2020   Procedure: TRANSESOPHAGEAL ECHOCARDIOGRAM (TEE);  Surgeon: Corliss Skains, MD;  Location: [facility] OR;  Service: Open Heart Surgery;  Laterality: N/A;   Patient Active Problem List   Diagnosis Date Noted   AKI (acute kidney injury) (HCC) 04/29/2023   Weakness 04/28/2023   Nephrosis 04/27/2023   CHF (congestive heart failure) (HCC) 04/26/2023   OSA (obstructive sleep apnea) 04/26/2023   Preoperative cardiovascular examination 04/25/2023   Hx of iron deficiency anemia 07/07/2022   Syncope and collapse 06/15/2022   AMS (altered mental status) 04/14/2022   Acute respiratory failure with hypoxia (HCC) 04/14/2022   Acute metabolic encephalopathy 04/14/2022   Pneumonia 04/14/2022   Ulcer of right foot, limited to breakdown of skin (HCC)    Below-knee amputation of left lower extremity (HCC)    Osteomyelitis of foot, right, acute (HCC) 01/06/2022   Bipolar disorder with depression (HCC)    Sepsis (HCC) 11/18/2021   Chest pain 11/18/2021   Dyspnea 11/18/2021    Wound infection    Ulcer of left foot with necrosis of bone (HCC)    Bicuspid aortic valve 09/14/2021   Aneurysm of ascending aorta (HCC) 09/14/2021   DM (diabetes mellitus) with peripheral vascular complication (HCC) 08/20/2021   Atypical chest pain 07/17/2021   Hyponatremia 07/17/2021   Leg wound, left 07/17/2021   Essential hypertension 07/17/2021   Acute hyperglycemia 07/09/2021   Non-ST elevation (NSTEMI) myocardial infarction (HCC) 07/22/2020   Heart failure with preserved ejection fraction (HCC) 07/22/2020   Hypotension 07/18/2020   Elevated troponin 07/18/2020   Acute renal failure superimposed on stage 3a chronic kidney disease (HCC) 07/18/2020   Elevated brain natriuretic peptide (BNP) level 07/18/2020   Edema 06/13/2019   Leukocytosis 05/31/2019   Smoker 03/08/2019   Chronic back pain 02/16/2019   Deep venous thrombosis (HCC) 02/16/2019   Obesity, Class III, BMI 40-49.9 (morbid obesity) (HCC) 02/16/2019   Peripheral nerve disease 02/16/2019   COPD (chronic obstructive pulmonary disease) (HCC) 01/22/2019   Anxiety and depression 03/26/2018   Sinusitis 10/12/2017   Abdominal pain    Loss of weight    Diabetic foot infection (HCC) 09/16/2017   Cellulitis of right foot    Recurrent chest pain 07/29/2016   Hyperglycemia    Insulin dependent type 2 diabetes mellitus (HCC) 04/29/2015   History of pulmonary embolus (PE) 04/27/2013   CAD (coronary artery disease) 09/10/2008    ONSET DATE: 09/26/2023 (most recent referral)  REFERRING DIAG: H06.237 (ICD-10-CM) - Acquired absence of left leg below knee  THERAPY DIAG:  Phantom limb syndrome with pain (HCC)  Muscle weakness (generalized)  Other abnormalities of gait and mobility  Unsteadiness on feet  Other symptoms and signs involving the musculoskeletal system  Other muscle spasm  Pain of both hip joints  Rationale for Evaluation and Treatment: Rehabilitation  SUBJECTIVE:  SUBJECTIVE STATEMENT: Pt states meds have since been added and he is unsure what they are.  He sometimes uses the rollator to stand vs wheelchair.  Almost fell today attempting to stand from locked rollator in lobby.  He fell 3x yesterday attempting to ambulate on his stumps when his rollator and w/c would not fit where he was.  He has sweated out of his liner where the whole prosthetic came off twice (most recent ED visit).  He is bottomed out in his sockets today with 8 ply on RLE and 10 ply on LLE.  He states his Lasix has been stopped due to frequent urination.  He states he got tired of going to [facility] for adjustments so he adjusted his prosthetics himself by turning his left foot out.  Glucose 248 at onset of session.  Pt accompanied by: self - aunt in lobby  PERTINENT HISTORY:  PMH signficant for AAA aneurysm, anxiety, asthma, CAD, COPD, DM, DVT, HNT, migraine, morbid obesity, MI, PE, peripheral nerve disease, B BKA with prosthetics, CABG, bilateral shoulder open rotator cuff repair. Recent vitreous hemorrhage procedure to improve left vision - remains somewhat blurry per pt report.  PAIN:  Are you having pain? Yes: NPRS scale: 7/10 Pain location: bilateral knees, shoulders, and low back Pain description: sore Aggravating factors: "breathing, existing" Relieving factors: pain patches  PRECAUTIONS: Fall and Other: B BKA ; left blurry vision  WEIGHT BEARING RESTRICTIONS: No  FALLS: Has patient fallen in last 6 months? Yes. Number of falls 5 since discharge 8/19  LIVING ENVIRONMENT: Lives with: lives with their spouse Lives in: House/apartment Stairs: No Has following equipment at home: Environmental consultant - 4 wheeled, Wheelchair (power), Wheelchair (manual), Tour manager, Ramped entry, and manual wheelchair does not work  PLOF: Independent with  gait, Independent with transfers, and Requires assistive device for independence  PATIENT GOALS: "to walk"  pt is trying to take care of himself so he can provide care giving as needed to spouse.  OBJECTIVE:   DIAGNOSTIC FINDINGS:  CT of Head and Neck from 10/01/2023 (from fall) - normal  COGNITION: Overall cognitive status:  decreased memory per patient report   SENSATION: Impaired due to neuropathy in residual limbs  POSTURE: rounded shoulders, forward head, and posterior pelvic tilt  LOWER EXTREMITY ROM:     Active  Right Eval Left Eval  Hip flexion Grossly WFL.  Hip extension   Hip abduction   Hip adduction   Hip internal rotation   Hip external rotation   Knee flexion   Knee extension   Ankle dorsiflexion    Ankle plantarflexion    Ankle inversion    Ankle eversion     (Blank rows = not tested)  LOWER EXTREMITY MMT:    MMT Right Eval Left Eval  Hip flexion 4/5 4/5  Hip extension    Hip abduction 4-/5 4-/5  Hip adduction 5/5 5/5  Hip internal rotation    Hip external rotation    Knee flexion    Knee extension 4/5 4/5  Ankle dorsiflexion    Ankle plantarflexion    Ankle inversion    Ankle eversion    (Blank rows = not tested)  BED MOBILITY:  Mod I per pt report  - w/ or w/o prosthetics donned  TRANSFERS: Assistive device utilized: Environmental consultant - 4 wheeled  Sit to stand: CGA and Min A Stand to sit: CGA Chair to chair: CGA  GAIT: Gait pattern: step through pattern, decreased stride length, decreased trunk rotation, trunk flexed,  and wide BOS Distance walked: various clinic distances Assistive device utilized: Walker - 4 wheeled Level of assistance: CGA Comments: Pt has some left ER, unable to formally assess if this is due to pt adjusted prosthetic or socket being loose and needing more socks.  TODAY'S TREATMENT:                                                                                                                              N/A - Eval  only.  PATIENT EDUCATION: Education details: Safety w/ rollator and standing from rollator.  Using PWC as needed based on prosthetic fit and fatigue/pain level.  PT POC, assessments to be used, and goals to be set. Person educated: Patient Education method: Explanation Education comprehension: verbalized understanding and needs further education  HOME EXERCISE PROGRAM: To be initiated.  GOALS: Goals reviewed with patient? Yes  SHORT TERM GOALS: Target date: 11/25/2023  Pt will be independent with initial HEP for improved strength, balance, transfers and gait. Baseline:  To be established. Goal status: INITIAL  2.  5xSTS to be completed with goal set as appropriate. Baseline:  To be assessed. Goal status: INITIAL  3.  Pt will ambulate >/=115 feet with LRAD and no more than SBA level of assist to promote household and community access. Baseline: various clinic distances w/ decreased safety due to socket fit, CGA. Goal status: INITIAL  4.  Pt will return to [facility] for appropriate socket adjustments to improve safety of gait. Baseline: Appt to be scheduled. Goal status: INITIAL   LONG TERM GOALS: Target date: 12/23/2023  Pt will be independent with final HEP for improved strength, balance, transfers and gait. Baseline: To be updated. Goal status: INITIAL  2.  5xSTS to be assessed w/ goal set as appropriate. Baseline: To be assessed. Goal status: INITIAL  3.  Pt will ambulate >/=230 feet with LRAD and no more than SBA level of assist to promote household and community access. Baseline: various clinic distances w/ decreased safety due to socket fit, CGA. Goal status: INITIAL  4.  to be assessed w/ goal set as appropriate. Baseline: To be assessed. Goal status: INITIAL  ASSESSMENT:  CLINICAL IMPRESSION: Patient is a 52 year old male referred to Neuro OPPT for ongoing care related to weakness and imbalance following amputation.   Pt's PMH is significant for: AAA  aneurysm, anxiety, asthma, CAD, COPD, DM, DVT, HNT, migraine, morbid obesity, MI, PE, peripheral nerve disease, B BKA with prosthetics, CABG, bilateral shoulder open rotator cuff repair. The following deficits were present during the exam: chronic postural changes, general LE weakness, decreased safety with standing transfers using rollator, and poor bilateral socket fit. Based on his history of frequent falls as well as B BKA, pt is an increased risk for falls.  Will further assess at next session.  Pt would benefit from skilled PT to address these impairments and functional limitations to maximize functional mobility independence.   OBJECTIVE IMPAIRMENTS:  Abnormal gait, decreased activity tolerance, decreased balance, decreased endurance, decreased knowledge of use of DME, decreased mobility, difficulty walking, decreased strength, improper body mechanics, postural dysfunction, prosthetic dependency , and obesity.   ACTIVITY LIMITATIONS: standing, squatting, stairs, reach over head, and locomotion level  PARTICIPATION LIMITATIONS: driving and community activity  PERSONAL FACTORS: Fitness, Transportation, and 3+ comorbidities: AAA aneurysm, anxiety, asthma, CAD, COPD, DM, DVT, HNT, migraine, morbid obesity, MI, PE, peripheral nerve disease, B BKA with prosthetics, CABG, bilateral shoulder open rotator cuff repair are also affecting patient's functional outcome .   REHAB POTENTIAL: Good  CLINICAL DECISION MAKING: Stable/uncomplicated  EVALUATION COMPLEXITY: Moderate  PLAN:  PT FREQUENCY: 2x/week  PT DURATION: 8 weeks  PLANNED INTERVENTIONS: Therapeutic exercises, Therapeutic activity, Neuromuscular re-education, Balance training, Gait training, Patient/Family education, Self Care, Prosthetic training, DME instructions, Wheelchair mobility training, scar mobilization, Manual therapy, and Re-evaluation  PLAN FOR NEXT SESSION: assess 5xSTS and - set goals, work on Personal assistant w/  rollator, Charity fundraiser to establish appt follow-up regarding socket fit (works with Clinical biochemist at [employer]) - until then work on appropriate fit with socks, endurance, sink HEP review, amputee edu (wear schedule, residual limb care/skin checks, etc.)   Sadie Haber, PT, DPT 10/26/2023, 12:57 PM

## 2023-10-27 ENCOUNTER — Telehealth: Payer: Self-pay | Admitting: Cardiovascular Disease

## 2023-10-27 ENCOUNTER — Other Ambulatory Visit: Payer: Self-pay

## 2023-10-27 MED ORDER — ATORVASTATIN CALCIUM 80 MG PO TABS: 80.0000 mg | ORAL_TABLET | Freq: Every day | ORAL | 0 refills | Status: DC

## 2023-10-27 MED ORDER — TORSEMIDE 20 MG PO TABS: 60.0000 mg | ORAL_TABLET | Freq: Every day | ORAL | 0 refills | Status: DC

## 2023-10-27 MED ORDER — RANOLAZINE ER 500 MG PO TB12: 500.0000 mg | ORAL_TABLET | Freq: Two times a day (BID) | ORAL | 1 refills | Status: DC

## 2023-10-27 MED ORDER — DAPAGLIFLOZIN PROPANEDIOL 10 MG PO TABS: 10.0000 mg | ORAL_TABLET | Freq: Every day | ORAL | 0 refills | Status: DC

## 2023-10-27 MED ORDER — CLOPIDOGREL BISULFATE 75 MG PO TABS: 75.0000 mg | ORAL_TABLET | Freq: Every day | ORAL | 0 refills | Status: DC

## 2023-10-27 MED ORDER — CARVEDILOL 6.25 MG PO TABS: 6.2500 mg | ORAL_TABLET | Freq: Two times a day (BID) | ORAL | 0 refills | Status: DC

## 2023-10-27 MED ORDER — ICOSAPENT ETHYL 1 G PO CAPS: 2.0000 g | ORAL_CAPSULE | Freq: Two times a day (BID) | ORAL | 3 refills | Status: DC

## 2023-10-27 NOTE — Telephone Encounter (Signed)
[facility] called to get all the patient's prescription sent over to them. Please fax over to [phone] ATTN: Julian Hy

## 2023-10-27 NOTE — Telephone Encounter (Signed)
Sent 30 day refill, per protocol, to [facility] of all cardiac meds.  Pt needs an appointment and asked to call the office.

## 2023-10-27 NOTE — Telephone Encounter (Signed)
Due to patient being last seen by Lorin Picket, meds will need to be refilled with his approval. Pt also overdue for follow up. Please schedule, thanks!

## 2023-10-27 NOTE — Telephone Encounter (Signed)
Patient last seen 04/25/2023 by Joseph Newcomer, PA recommended for follow up in 1 month which was not completed. Plan to follow at [facility] with Joseph [name] or Joseph [name] for easier access due to transportation.  He needs to be scheduled for overdue follow up with Joseph [name] or Joseph [name] at [facility] office.   Would be reasonable to provide 30 day refill of Atorvastatin, Vascepa, PRN Nitroglycerin, Ranexa at present doses. Other medications appear to be filled by other providers. Will require OV for further refills.   Joseph Sorrow, NP

## 2023-11-02 ENCOUNTER — Ambulatory Visit: Payer: Medicare HMO | Admitting: Physical Therapy

## 2023-11-03 ENCOUNTER — Other Ambulatory Visit: Payer: Self-pay | Admitting: Physician Assistant

## 2023-11-04 ENCOUNTER — Ambulatory Visit: Payer: Medicare HMO | Admitting: Physical Therapy

## 2023-11-09 ENCOUNTER — Ambulatory Visit: Payer: Medicare HMO | Admitting: Physical Therapy

## 2023-11-11 ENCOUNTER — Encounter: Payer: Self-pay | Admitting: Physical Therapy

## 2023-11-11 ENCOUNTER — Ambulatory Visit: Payer: Medicare HMO | Admitting: Physical Therapy

## 2023-11-11 DIAGNOSIS — G546 Phantom limb syndrome with pain: Secondary | ICD-10-CM | POA: Diagnosis not present

## 2023-11-11 DIAGNOSIS — M6281 Muscle weakness (generalized): Secondary | ICD-10-CM

## 2023-11-11 DIAGNOSIS — R2689 Other abnormalities of gait and mobility: Secondary | ICD-10-CM

## 2023-11-11 DIAGNOSIS — R2681 Unsteadiness on feet: Secondary | ICD-10-CM

## 2023-11-11 DIAGNOSIS — R29898 Other symptoms and signs involving the musculoskeletal system: Secondary | ICD-10-CM

## 2023-11-11 NOTE — Therapy (Signed)
OUTPATIENT PHYSICAL THERAPY NEURO TREATMENT   Patient Name: Joseph [name] MRN: [id number] DOB:1971-12-14, 52 y.o., male Today's Date: 11/11/2023   PCP: Lucile Crater FNP-BC ([facility]) REFERRING PROVIDER: Patrick Jupiter, NP  END OF SESSION:  PT End of Session - 11/11/23 1153     Visit Number 2    Number of Visits 17    Date for PT Re-Evaluation 12/30/23    Authorization Type [employer]    Progress Note Due on Visit 10    PT Start Time 1150    PT Stop Time 1235    PT Time Calculation (min) 45 min    Equipment Utilized During Treatment Gait belt    Activity Tolerance Patient tolerated treatment well;Patient limited by fatigue;Patient limited by pain    Behavior During Therapy WFL for tasks assessed/performed             Past Medical History:  Diagnosis Date   Aneurysm of ascending aorta (HCC) 09/14/2021   Anxiety    Arthritis    "knees, shoulders, hips, ankles" (07/29/2016)   Asthma    Bicuspid aortic valve 09/14/2021   CAD (coronary artery disease)    a. 2017: s/p BMS to distal Cx; b. LHC 05/30/2019: 80% mid, distal RCA s/p DES, 30% narrowing of d LM, widely patent LAD w/ luminal irregularities, widely patent stent in dCX  w/ 90+% stenosis distal to stent beofre small trifurcating obtuse marginal (potentially area of restenosis)   Chronic bronchitis (HCC)    Chronic ulcer of great toe of right foot (HCC) 09/09/2017   Chronic ulcer of right great toe (HCC) 09/19/2017   COPD (chronic obstructive pulmonary disease) (HCC)    Coronary arteriosclerosis    Depression    Diabetes mellitus (HCC)    DVT (deep venous thrombosis) (HCC)    GERD (gastroesophageal reflux disease)    Hyperlipidemia    Hypertension    Migraine    Morbid obesity (HCC)    Myocardial infarction (HCC) 1996   "light one"   PE (pulmonary embolism) 04/2013   On chronic Xarelto   Peripheral nerve disease    Pneumonia "several times"   Sleep apnea    Type II diabetes mellitus (HCC)    Past  Surgical History:  Procedure Laterality Date   ABDOMINAL AORTOGRAM W/LOWER EXTREMITY N/A 10/10/2020   Procedure: ABDOMINAL AORTOGRAM W/LOWER EXTREMITY;  Surgeon: Sherren Kerns, MD;  Location: [facility];  Service: Cardiovascular;  Laterality: N/A;   AMPUTATION Right 09/21/2017   Procedure: RIGHT GREAT TOE AMPUTATION, POSSIBLE VAC;  Surgeon: Tarry Kos, MD;  Location: [facility] OR;  Service: Orthopedics;  Laterality: Right;   AMPUTATION Left 08/13/2020   Procedure: LEFT FOOT 4TH RAY AMPUTATION;  Surgeon: [name], MD;  Location: [facility] OR;  Service: Orthopedics;  Laterality: Left;   AMPUTATION Left 11/20/2021   Procedure: LEFT TRANSMETATARSAL AMPUTATION;  Surgeon: [name], MD;  Location: [facility] OR;  Service: Orthopedics;  Laterality: Left;   AMPUTATION Left 01/08/2022   Procedure: LEFT BELOW KNEE AMPUTATION;  Surgeon: [name], MD;  Location: [facility] OR;  Service: Orthopedics;  Laterality: Left;   AMPUTATION Right 07/07/2022   Procedure: RIGHT BELOW KNEE AMPUTATION;  Surgeon: [name], MD;  Location: [facility] OR;  Service: Orthopedics;  Laterality: Right;   AORTOGRAM Bilateral 03/13/2021   Procedure: ABDOMINAL AORTOGRAM WITH Left LOWER EXTREMITY RUNOFF;  Surgeon: Leonie Douglas, MD;  Location: [facility] OR;  Service: Vascular;  Laterality: Bilateral;   APPLICATION OF  WOUND VAC  01/08/2022   Procedure: APPLICATION OF WOUND VAC;  Surgeon: [name], MD;  Location: [facility] OR;  Service: Orthopedics;;   APPLICATION OF WOUND VAC Right 07/07/2022   Procedure: APPLICATION OF WOUND VAC;  Surgeon: [name], MD;  Location: [facility] OR;  Service: Orthopedics;  Laterality: Right;   CARDIAC CATHETERIZATION  2006   CARDIAC CATHETERIZATION  1996   "@ [facility]; when I had my heart attack"   CARDIAC CATHETERIZATION N/A 07/29/2016   Procedure: Left Heart Cath and Coronary Angiography;  Surgeon: Runell Gess, MD;  Location: [facility] INVASIVE CV LAB;  Service: Cardiovascular;  Laterality: N/A;   CARDIAC  CATHETERIZATION N/A 07/29/2016   Procedure: Coronary Stent Intervention;  Surgeon: Runell Gess, MD;  Location: [facility];  Service: Cardiovascular;  Laterality: N/A;   CARPAL TUNNEL RELEASE Bilateral    CORONARY ANGIOPLASTY WITH STENT PLACEMENT  07/29/2016   CORONARY ARTERY BYPASS GRAFT N/A 07/24/2020   Procedure: CORONARY ARTERY BYPASS GRAFTING (CABG), ON PUMP, TIMES FOUR, USING LEFT INTERNAL MAMMARY ARTERY AND ENDOSCOPICALLY HARVESTED RIGHT GREATER SAPHENOUS VEIN;  Surgeon: Corliss Skains, MD;  Location: [facility] OR;  Service: Open Heart Surgery;  Laterality: N/A;  FLOW TAC   CORONARY PRESSURE/FFR STUDY N/A 07/22/2020   Procedure: INTRAVASCULAR PRESSURE WIRE/FFR STUDY;  Surgeon: Marykay Lex, MD;  Location: [facility] INVASIVE CV LAB;  Service: Cardiovascular;  Laterality: N/A;   CORONARY STENT INTERVENTION N/A 05/30/2019   Procedure: CORONARY STENT INTERVENTION;  Surgeon: Lyn Records, MD;  Location: [facility];  Service: Cardiovascular;  Laterality: N/A;   CORONARY STENT INTERVENTION N/A 11/02/2019   Procedure: CORONARY STENT INTERVENTION;  Surgeon: Iran Ouch, MD;  Location: [facility];  Service: Cardiovascular;  Laterality: N/A;   ESOPHAGOGASTRODUODENOSCOPY N/A 09/22/2017   Procedure: ESOPHAGOGASTRODUODENOSCOPY (EGD);  Surgeon: Meryl Dare, MD;  Location: [facility] ENDOSCOPY;  Service: Endoscopy;  Laterality: N/A;   KNEE ARTHROSCOPY Bilateral    "2 on left; 1 on the right"   LEFT HEART CATH AND CORONARY ANGIOGRAPHY N/A 11/02/2019   Procedure: LEFT HEART CATH AND CORONARY ANGIOGRAPHY;  Surgeon: Iran Ouch, MD;  Location: [facility];  Service: Cardiovascular;  Laterality: N/A;   LEFT HEART CATH AND CORONARY ANGIOGRAPHY N/A 07/22/2020   Procedure: LEFT HEART CATH AND CORONARY ANGIOGRAPHY;  Surgeon: Marykay Lex, MD;  Location: [facility] INVASIVE CV LAB;  Service: Cardiovascular;  Laterality: N/A;   LEFT HEART CATH AND CORS/GRAFTS ANGIOGRAPHY N/A 08/17/2021    Procedure: LEFT HEART CATH AND CORS/GRAFTS ANGIOGRAPHY;  Surgeon: Yvonne Kendall, MD;  Location: [facility];  Service: Cardiovascular;  Laterality: N/A;   PERIPHERAL VASCULAR INTERVENTION Left 10/11/2020   popliteal and SFA stent placement    PERIPHERAL VASCULAR INTERVENTION Left 10/10/2020   Procedure: PERIPHERAL VASCULAR INTERVENTION;  Surgeon: Sherren Kerns, MD;  Location: [facility];  Service: Cardiovascular;  Laterality: Left;   RIGHT/LEFT HEART CATH AND CORONARY ANGIOGRAPHY N/A 05/30/2019   Procedure: RIGHT/LEFT HEART CATH AND CORONARY ANGIOGRAPHY;  Surgeon: Lyn Records, MD;  Location: [facility];  Service: Cardiovascular;  Laterality: N/A;   RIGHT/LEFT HEART CATH AND CORONARY/GRAFT ANGIOGRAPHY N/A 06/17/2022   Procedure: RIGHT/LEFT HEART CATH AND CORONARY/GRAFT ANGIOGRAPHY;  Surgeon: Lennette Bihari, MD;  Location: [facility];  Service: Cardiovascular;  Laterality: N/A;   SHOULDER OPEN ROTATOR CUFF REPAIR Bilateral    TEE WITHOUT CARDIOVERSION N/A 07/24/2020   Procedure: TRANSESOPHAGEAL ECHOCARDIOGRAM (TEE);  Surgeon:  Corliss Skains, MD;  Location: [facility] OR;  Service: Open Heart Surgery;  Laterality: N/A;   Patient Active Problem List   Diagnosis Date Noted   AKI (acute kidney injury) (HCC) 04/29/2023   Weakness 04/28/2023   Nephrosis 04/27/2023   CHF (congestive heart failure) (HCC) 04/26/2023   OSA (obstructive sleep apnea) 04/26/2023   Preoperative cardiovascular examination 04/25/2023   Hx of iron deficiency anemia 07/07/2022   Syncope and collapse 06/15/2022   AMS (altered mental status) 04/14/2022   Acute respiratory failure with hypoxia (HCC) 04/14/2022   Acute metabolic encephalopathy 04/14/2022   Pneumonia 04/14/2022   Ulcer of right foot, limited to breakdown of skin (HCC)    Below-knee amputation of left lower extremity (HCC)    Osteomyelitis of foot, right, acute (HCC) 01/06/2022   Bipolar disorder with depression (HCC)     Sepsis (HCC) 11/18/2021   Chest pain 11/18/2021   Dyspnea 11/18/2021   Wound infection    Ulcer of left foot with necrosis of bone (HCC)    Bicuspid aortic valve 09/14/2021   Aneurysm of ascending aorta (HCC) 09/14/2021   DM (diabetes mellitus) with peripheral vascular complication (HCC) 08/20/2021   Atypical chest pain 07/17/2021   Hyponatremia 07/17/2021   Leg wound, left 07/17/2021   Essential hypertension 07/17/2021   Acute hyperglycemia 07/09/2021   Non-ST elevation (NSTEMI) myocardial infarction (HCC) 07/22/2020   Heart failure with preserved ejection fraction (HCC) 07/22/2020   Hypotension 07/18/2020   Elevated troponin 07/18/2020   Acute renal failure superimposed on stage 3a chronic kidney disease (HCC) 07/18/2020   Elevated brain natriuretic peptide (BNP) level 07/18/2020   Edema 06/13/2019   Leukocytosis 05/31/2019   Smoker 03/08/2019   Chronic back pain 02/16/2019   Deep venous thrombosis (HCC) 02/16/2019   Obesity, Class III, BMI 40-49.9 (morbid obesity) (HCC) 02/16/2019   Peripheral nerve disease 02/16/2019   COPD (chronic obstructive pulmonary disease) (HCC) 01/22/2019   Anxiety and depression 03/26/2018   Sinusitis 10/12/2017   Abdominal pain    Loss of weight    Diabetic foot infection (HCC) 09/16/2017   Cellulitis of right foot    Recurrent chest pain 07/29/2016   Hyperglycemia    Insulin dependent type 2 diabetes mellitus (HCC) 04/29/2015   History of pulmonary embolus (PE) 04/27/2013   CAD (coronary artery disease) 09/10/2008    ONSET DATE: 09/26/2023 (most recent referral)  REFERRING DIAG: U04.540 (ICD-10-CM) - Acquired absence of left leg below knee  THERAPY DIAG:  Muscle weakness (generalized)  Other abnormalities of gait and mobility  Unsteadiness on feet  Other symptoms and signs involving the musculoskeletal system  Rationale for Evaluation and Treatment: Rehabilitation  SUBJECTIVE:  SUBJECTIVE STATEMENT: Patient reports that his shoulder has been hurting more since his fall; he went to stand up after having his legs on for multiple hours and they did not fit and they went left and he went R and hit the floor. Patient had an x-ray and no broken bones but his shoulder has been hurting since then. Patient has not been able to lift his R arm fully since then. Patient reports that he has not been able to walk since then due the pain. Glucose 207 at onset of session. Fall occurred prior to eval and no new evals. Patient reports wanting to transfer care to [facility] for future prosthetics. Patient has not brought PT referral for shoulder.  Pt accompanied by: self   PERTINENT HISTORY:  PMH signficant for AAA aneurysm, anxiety, asthma, CAD, COPD, DM, DVT, HNT, migraine, morbid obesity, MI, PE, peripheral nerve disease, B BKA with prosthetics, CABG, bilateral shoulder open rotator cuff repair. Recent vitreous hemorrhage procedure to improve left vision - remains somewhat blurry per pt report.  PAIN:  Are you having pain? Yes: NPRS scale: unrated but very bad/10 Pain location: R shoulder Pain description: sore Aggravating factors: "breathing, existing" Relieving factors: pain patches  PRECAUTIONS: Fall and Other: B BKA ; left blurry vision  WEIGHT BEARING RESTRICTIONS: No  FALLS: Has patient fallen in last 6 months? Yes. Number of falls 5 since discharge 8/19  LIVING ENVIRONMENT: Lives with: lives with their spouse Lives in: House/apartment Stairs: No Has following equipment at home: Environmental consultant - 4 wheeled, Wheelchair (power), Wheelchair (manual), Tour manager, Ramped entry, and manual wheelchair does not work  PLOF: Independent with gait, Independent with transfers, and Requires assistive device for independence  PATIENT GOALS: "to walk"  pt is  trying to take care of himself so he can provide care giving as needed to spouse.  OBJECTIVE:   DIAGNOSTIC FINDINGS:  CT of Head and Neck from 10/01/2023 (from fall) - normal  COGNITION: Overall cognitive status:  decreased memory per patient report   SENSATION: Impaired due to neuropathy in residual limbs  POSTURE: rounded shoulders, forward head, and posterior pelvic tilt  LOWER EXTREMITY ROM:     Active  Right Eval Left Eval  Hip flexion Grossly WFL.  Hip extension   Hip abduction   Hip adduction   Hip internal rotation   Hip external rotation   Knee flexion   Knee extension   Ankle dorsiflexion    Ankle plantarflexion    Ankle inversion    Ankle eversion     (Blank rows = not tested)  LOWER EXTREMITY MMT:    MMT Right Eval Left Eval  Hip flexion 4/5 4/5  Hip extension    Hip abduction 4-/5 4-/5  Hip adduction 5/5 5/5  Hip internal rotation    Hip external rotation    Knee flexion    Knee extension 4/5 4/5  Ankle dorsiflexion    Ankle plantarflexion    Ankle inversion    Ankle eversion    (Blank rows = not tested)  BED MOBILITY:  Mod I per pt report  - w/ or w/o prosthetics donned  TRANSFERS: Assistive device utilized: Environmental consultant - 4 wheeled  Sit to stand: CGA and Min A Stand to sit: CGA Chair to chair: CGA  GAIT: Gait pattern: step through pattern, decreased stride length, decreased trunk rotation, trunk flexed, and wide BOS Distance walked: various clinic distances Assistive device utilized: Walker - 4 wheeled Level of assistance: CGA Comments: Pt has some  left ER, unable to formally assess if this is due to pt adjusted prosthetic or socket being loose and needing more socks.  TODAY'S TREATMENT:                                                                                                                               TherAct:   Second therapist provides assistance throughout course of session as has more known contact with other therapist.    Patient arrives to session with prosthetics doffed; reports has not been able to get legs on due to LE swelling. Patient has also not been able to stand also due to R UE pain. Discussed transfer to [facility] and second therapist assisted in faxing over patient's information per patient request to establish care with [facility]. Patient also provided [facility] info.  Second therapist able to don patient's R prosthetic with patient in PWC. Unable to don LLE due to pocket of swelling behind L knee. Patient in agreement to trying to pump fluid out of legs on bike. Assisted patient with setup assist and CGA for bump transfer to the L from PWC to SciFit with RLE donned. Therapist then assists donning LLE loosely and assisting placement on to SciFit step to help pump fluid out of leg to lock in. Complete x 6 min at level 3. Upon completion able to fully don/lock LLE; perform bump transfer back to the L off Scifit.   Skin checked performed at end of session and notable red mark noted on L medial thigh where fluid volumen continuing to cause rubbing in current prosthetic and mild cut on patient's L uppper thigh that reports due to dropping razor. Advise patient to continue to monitor and ensure red spot disappears within 20 min. Patient verbalizes understanding. Red mark diminshes within few minutes of assessment but patient requires new prostheitc limbs for safety.   PATIENT EDUCATION: Education details: See above + establishing care with [facility] + skin safety Person educated: Patient Education method: Explanation Education comprehension: verbalized understanding and needs further education  HOME EXERCISE PROGRAM: To be initiated.  GOALS: Goals reviewed with patient? Yes  SHORT TERM GOALS: Target date: 11/25/2023  Pt will be independent with initial HEP for improved strength, balance, transfers and gait. Baseline:  To be established. Goal status: INITIAL  2.  5xSTS to be completed with goal set as  appropriate. Baseline:  To be assessed. Goal status: INITIAL  3.  Pt will ambulate >/=115 feet with LRAD and no more than SBA level of assist to promote household and community access. Baseline: various clinic distances w/ decreased safety due to socket fit, CGA. Goal status: INITIAL  4.  Pt will return to [facility] for appropriate socket adjustments to improve safety of gait. Baseline: Appt to be scheduled. Goal status: INITIAL   LONG TERM GOALS: Target date: 12/23/2023  Pt will be independent with final HEP for improved strength, balance, transfers and gait. Baseline: To be updated. Goal status:  INITIAL  2.  5xSTS to be assessed w/ goal set as appropriate. Baseline: To be assessed. Goal status: INITIAL  3.  Pt will ambulate >/=230 feet with LRAD and no more than SBA level of assist to promote household and community access. Baseline: various clinic distances w/ decreased safety due to socket fit, CGA. Goal status: INITIAL  4.  to be assessed w/ goal set as appropriate. Baseline: To be assessed. Goal status: INITIAL  ASSESSMENT:  CLINICAL IMPRESSION: Pt session very limited by ongoing poor fit of current prosthetic limbs not accommodating patient's fluctuations in fluid. Pt request to establish care with [facility] for new limb so therapist assist in transfer. Pt able to get both legs donned with use of SciFit but unable to tolerate additional time in LLE due to poor fit placing patient at risk of skin breakdown. Pt to bring referral for shoulder as well to address impairments since fall. Pt would benefit from skilled PT to address these impairments and functional limitations to maximize functional mobility independence.   OBJECTIVE IMPAIRMENTS: Abnormal gait, decreased activity tolerance, decreased balance, decreased endurance, decreased knowledge of use of DME, decreased mobility, difficulty walking, decreased strength, improper body mechanics, postural dysfunction, prosthetic  dependency , and obesity.   ACTIVITY LIMITATIONS: standing, squatting, stairs, reach over head, and locomotion level  PARTICIPATION LIMITATIONS: driving and community activity  PERSONAL FACTORS: Fitness, Transportation, and 3+ comorbidities: AAA aneurysm, anxiety, asthma, CAD, COPD, DM, DVT, HNT, migraine, morbid obesity, MI, PE, peripheral nerve disease, B BKA with prosthetics, CABG, bilateral shoulder open rotator cuff repair are also affecting patient's functional outcome .   REHAB POTENTIAL: Good  CLINICAL DECISION MAKING: Stable/uncomplicated  EVALUATION COMPLEXITY: Moderate  PLAN:  PT FREQUENCY: 2x/week  PT DURATION: 8 weeks  PLANNED INTERVENTIONS: Therapeutic exercises, Therapeutic activity, Neuromuscular re-education, Balance training, Gait training, Patient/Family education, Self Care, Prosthetic training, DME instructions, Wheelchair mobility training, scar mobilization, Manual therapy, and Re-evaluation  PLAN FOR NEXT SESSION:   Re-cert POC to include shoulder when shoulder referral comes in; work on modifying goals now that working on transitioning to new limbs with [facility], pending leg status work on leg strengthening supine/core stability/transfers  standing and gait once acquire new limbs   Carmelia Bake, PT, DPT 11/11/2023, 1:01 PM

## 2023-11-14 ENCOUNTER — Ambulatory Visit: Payer: Medicare HMO | Admitting: Physical Therapy

## 2023-11-16 ENCOUNTER — Ambulatory Visit: Payer: Medicare HMO | Admitting: Physical Therapy

## 2023-11-21 ENCOUNTER — Ambulatory Visit: Payer: Medicare HMO | Admitting: Physical Therapy

## 2023-11-23 ENCOUNTER — Ambulatory Visit: Payer: Medicare HMO | Admitting: Physical Therapy

## 2023-11-25 ENCOUNTER — Other Ambulatory Visit: Payer: Self-pay | Admitting: Physician Assistant

## 2023-11-26 ENCOUNTER — Encounter (HOSPITAL_COMMUNITY): Payer: Self-pay

## 2023-11-26 ENCOUNTER — Other Ambulatory Visit: Payer: Self-pay

## 2023-11-26 ENCOUNTER — Emergency Department (HOSPITAL_COMMUNITY): Payer: Medicare HMO

## 2023-11-26 ENCOUNTER — Inpatient Hospital Stay (HOSPITAL_COMMUNITY)
Admission: EM | Admit: 2023-11-26 | Discharge: 2023-12-03 | DRG: 689 | Disposition: A | Discharge status: Home / Self Care | Payer: Medicare HMO | Attending: Internal Medicine | Admitting: Internal Medicine

## 2023-11-26 DIAGNOSIS — E1142 Type 2 diabetes mellitus with diabetic polyneuropathy: Secondary | ICD-10-CM | POA: Diagnosis present

## 2023-11-26 DIAGNOSIS — E1165 Type 2 diabetes mellitus with hyperglycemia: Secondary | ICD-10-CM | POA: Diagnosis not present

## 2023-11-26 DIAGNOSIS — N3080 Other cystitis without hematuria: Secondary | ICD-10-CM | POA: Diagnosis not present

## 2023-11-26 DIAGNOSIS — G4733 Obstructive sleep apnea (adult) (pediatric): Secondary | ICD-10-CM | POA: Diagnosis present

## 2023-11-26 DIAGNOSIS — Z7982 Long term (current) use of aspirin: Secondary | ICD-10-CM

## 2023-11-26 DIAGNOSIS — B961 Klebsiella pneumoniae [K. pneumoniae] as the cause of diseases classified elsewhere: Secondary | ICD-10-CM | POA: Diagnosis present

## 2023-11-26 DIAGNOSIS — E785 Hyperlipidemia, unspecified: Secondary | ICD-10-CM | POA: Diagnosis present

## 2023-11-26 DIAGNOSIS — R531 Weakness: Secondary | ICD-10-CM

## 2023-11-26 DIAGNOSIS — I2510 Atherosclerotic heart disease of native coronary artery without angina pectoris: Secondary | ICD-10-CM | POA: Diagnosis present

## 2023-11-26 DIAGNOSIS — N179 Acute kidney failure, unspecified: Secondary | ICD-10-CM | POA: Diagnosis present

## 2023-11-26 DIAGNOSIS — N3081 Other cystitis with hematuria: Secondary | ICD-10-CM | POA: Diagnosis not present

## 2023-11-26 DIAGNOSIS — Z951 Presence of aortocoronary bypass graft: Secondary | ICD-10-CM

## 2023-11-26 DIAGNOSIS — Z833 Family history of diabetes mellitus: Secondary | ICD-10-CM

## 2023-11-26 DIAGNOSIS — K5900 Constipation, unspecified: Secondary | ICD-10-CM | POA: Diagnosis not present

## 2023-11-26 DIAGNOSIS — K219 Gastro-esophageal reflux disease without esophagitis: Secondary | ICD-10-CM | POA: Diagnosis present

## 2023-11-26 DIAGNOSIS — Z7901 Long term (current) use of anticoagulants: Secondary | ICD-10-CM

## 2023-11-26 DIAGNOSIS — R0902 Hypoxemia: Secondary | ICD-10-CM | POA: Diagnosis not present

## 2023-11-26 DIAGNOSIS — Z89511 Acquired absence of right leg below knee: Secondary | ICD-10-CM

## 2023-11-26 DIAGNOSIS — Z86711 Personal history of pulmonary embolism: Secondary | ICD-10-CM

## 2023-11-26 DIAGNOSIS — I252 Old myocardial infarction: Secondary | ICD-10-CM

## 2023-11-26 DIAGNOSIS — F32A Depression, unspecified: Secondary | ICD-10-CM | POA: Diagnosis present

## 2023-11-26 DIAGNOSIS — Z1611 Resistance to penicillins: Secondary | ICD-10-CM | POA: Diagnosis present

## 2023-11-26 DIAGNOSIS — Z955 Presence of coronary angioplasty implant and graft: Secondary | ICD-10-CM

## 2023-11-26 DIAGNOSIS — Z79899 Other long term (current) drug therapy: Secondary | ICD-10-CM

## 2023-11-26 DIAGNOSIS — Z791 Long term (current) use of non-steroidal anti-inflammatories (NSAID): Secondary | ICD-10-CM

## 2023-11-26 DIAGNOSIS — E66813 Obesity, class 3: Secondary | ICD-10-CM | POA: Diagnosis present

## 2023-11-26 DIAGNOSIS — E119 Type 2 diabetes mellitus without complications: Secondary | ICD-10-CM

## 2023-11-26 DIAGNOSIS — N1831 Chronic kidney disease, stage 3a: Secondary | ICD-10-CM | POA: Diagnosis present

## 2023-11-26 DIAGNOSIS — E1122 Type 2 diabetes mellitus with diabetic chronic kidney disease: Secondary | ICD-10-CM | POA: Diagnosis present

## 2023-11-26 DIAGNOSIS — T50915A Adverse effect of multiple unspecified drugs, medicaments and biological substances, initial encounter: Secondary | ICD-10-CM | POA: Diagnosis present

## 2023-11-26 DIAGNOSIS — Z86718 Personal history of other venous thrombosis and embolism: Secondary | ICD-10-CM

## 2023-11-26 DIAGNOSIS — I5032 Chronic diastolic (congestive) heart failure: Secondary | ICD-10-CM | POA: Diagnosis present

## 2023-11-26 DIAGNOSIS — E875 Hyperkalemia: Secondary | ICD-10-CM | POA: Diagnosis present

## 2023-11-26 DIAGNOSIS — G894 Chronic pain syndrome: Secondary | ICD-10-CM | POA: Diagnosis present

## 2023-11-26 DIAGNOSIS — Z87891 Personal history of nicotine dependence: Secondary | ICD-10-CM

## 2023-11-26 DIAGNOSIS — Z6841 Body Mass Index (BMI) 40.0 and over, adult: Secondary | ICD-10-CM

## 2023-11-26 DIAGNOSIS — Z89512 Acquired absence of left leg below knee: Secondary | ICD-10-CM

## 2023-11-26 DIAGNOSIS — Z882 Allergy status to sulfonamides status: Secondary | ICD-10-CM

## 2023-11-26 DIAGNOSIS — Z7902 Long term (current) use of antithrombotics/antiplatelets: Secondary | ICD-10-CM

## 2023-11-26 DIAGNOSIS — Z8249 Family history of ischemic heart disease and other diseases of the circulatory system: Secondary | ICD-10-CM

## 2023-11-26 DIAGNOSIS — Z794 Long term (current) use of insulin: Secondary | ICD-10-CM

## 2023-11-26 DIAGNOSIS — F419 Anxiety disorder, unspecified: Secondary | ICD-10-CM | POA: Diagnosis present

## 2023-11-26 DIAGNOSIS — I700 Atherosclerosis of aorta: Secondary | ICD-10-CM | POA: Insufficient documentation

## 2023-11-26 DIAGNOSIS — I130 Hypertensive heart and chronic kidney disease with heart failure and stage 1 through stage 4 chronic kidney disease, or unspecified chronic kidney disease: Secondary | ICD-10-CM | POA: Diagnosis present

## 2023-11-26 DIAGNOSIS — Z7984 Long term (current) use of oral hypoglycemic drugs: Secondary | ICD-10-CM

## 2023-11-26 DIAGNOSIS — E1151 Type 2 diabetes mellitus with diabetic peripheral angiopathy without gangrene: Secondary | ICD-10-CM | POA: Diagnosis present

## 2023-11-26 DIAGNOSIS — N183 Chronic kidney disease, stage 3 unspecified: Secondary | ICD-10-CM | POA: Insufficient documentation

## 2023-11-26 DIAGNOSIS — Q2381 Bicuspid aortic valve: Secondary | ICD-10-CM

## 2023-11-26 DIAGNOSIS — G928 Other toxic encephalopathy: Secondary | ICD-10-CM | POA: Diagnosis present

## 2023-11-26 LAB — CBC WITH DIFFERENTIAL/PLATELET
Abs Immature Granulocytes: 0.06 10*3/uL (ref 0.00–0.07)
Basophils Absolute: 0.1 10*3/uL (ref 0.0–0.1)
Basophils Relative: 1 %
Eosinophils Absolute: 0.9 10*3/uL — ABNORMAL HIGH (ref 0.0–0.5)
Eosinophils Relative: 7 %
HCT: 42.6 % (ref 39.0–52.0)
Hemoglobin: 13.6 g/dL (ref 13.0–17.0)
Immature Granulocytes: 1 %
Lymphocytes Relative: 19 %
Lymphs Abs: 2.4 10*3/uL (ref 0.7–4.0)
MCH: 27.7 pg (ref 26.0–34.0)
MCHC: 31.9 g/dL (ref 30.0–36.0)
MCV: 86.8 fL (ref 80.0–100.0)
Monocytes Absolute: 1.2 10*3/uL — ABNORMAL HIGH (ref 0.1–1.0)
Monocytes Relative: 9 %
Neutro Abs: 8.1 10*3/uL — ABNORMAL HIGH (ref 1.7–7.7)
Neutrophils Relative %: 63 %
Platelets: 328 10*3/uL (ref 150–400)
RBC: 4.91 MIL/uL (ref 4.22–5.81)
RDW: 15.7 % — ABNORMAL HIGH (ref 11.5–15.5)
WBC: 12.7 10*3/uL — ABNORMAL HIGH (ref 4.0–10.5)
nRBC: 0 % (ref 0.0–0.2)

## 2023-11-26 LAB — URINALYSIS, ROUTINE W REFLEX MICROSCOPIC
Bilirubin Urine: NEGATIVE
Glucose, UA: >=500 mg/dL — AB
Ketones, ur: NEGATIVE mg/dL
Nitrite: POSITIVE — AB
Protein, ur: >=300 mg/dL — AB
RBC / HPF: >50 RBC/hpf (ref 0–5)
Specific Gravity, Urine: 1.016 (ref 1.005–1.030)
WBC, UA: >50 WBC/hpf (ref 0–5)
pH: 5 (ref 5.0–8.0)

## 2023-11-26 LAB — COMPREHENSIVE METABOLIC PANEL
ALT: 18 U/L (ref 0–44)
AST: 18 U/L (ref 15–41)
Albumin: 3.1 g/dL — ABNORMAL LOW (ref 3.5–5.0)
Alkaline Phosphatase: 122 U/L (ref 38–126)
Anion gap: 9 (ref 5–15)
BUN: 46 mg/dL — ABNORMAL HIGH (ref 6–20)
CO2: 20 mmol/L — ABNORMAL LOW (ref 22–32)
Calcium: 8.4 mg/dL — ABNORMAL LOW (ref 8.9–10.3)
Chloride: 106 mmol/L (ref 98–111)
Creatinine, Ser: 2.13 mg/dL — ABNORMAL HIGH (ref 0.61–1.24)
GFR, Estimated: 37 mL/min — ABNORMAL LOW (ref >60)
Glucose, Bld: 221 mg/dL — ABNORMAL HIGH (ref 70–99)
Potassium: 5.2 mmol/L — ABNORMAL HIGH (ref 3.5–5.1)
Sodium: 135 mmol/L (ref 135–145)
Total Bilirubin: 0.4 mg/dL (ref <1.2)
Total Protein: 6.6 g/dL (ref 6.5–8.1)

## 2023-11-26 LAB — GLUCOSE, CAPILLARY
Glucose-Capillary: 202 mg/dL — ABNORMAL HIGH (ref 70–99)
Glucose-Capillary: 170 mg/dL — ABNORMAL HIGH (ref 70–99)

## 2023-11-26 LAB — HIV ANTIBODY (ROUTINE TESTING W REFLEX): HIV Screen 4th Generation wRfx: NONREACTIVE

## 2023-11-26 MED ORDER — SODIUM CHLORIDE 0.9 % IV SOLN
2.0000 g | Freq: Once | INTRAVENOUS | Status: AC
  Administered 2023-11-26: 2 g via INTRAVENOUS
  Filled 2023-11-26: qty 20

## 2023-11-26 MED ORDER — ONDANSETRON HCL 4 MG PO TABS: 4.0000 mg | ORAL_TABLET | Freq: Four times a day (QID) | ORAL | Status: DC | PRN

## 2023-11-26 MED ORDER — CARVEDILOL 6.25 MG PO TABS
6.2500 mg | ORAL_TABLET | Freq: Two times a day (BID) | ORAL | Status: DC
  Administered 2023-11-26 – 2023-12-03 (×14): 6.25 mg via ORAL
  Filled 2023-11-26 (×14): qty 1

## 2023-11-26 MED ORDER — INSULIN GLARGINE-YFGN 100 UNIT/ML ~~LOC~~ SOLN
35.0000 [IU] | Freq: Two times a day (BID) | SUBCUTANEOUS | Status: DC
  Administered 2023-11-26 – 2023-12-03 (×14): 35 [IU] via SUBCUTANEOUS
  Filled 2023-11-26 (×15): qty 0.35

## 2023-11-26 MED ORDER — RANOLAZINE ER 500 MG PO TB12
500.0000 mg | ORAL_TABLET | Freq: Two times a day (BID) | ORAL | Status: DC
  Administered 2023-11-26 – 2023-12-03 (×14): 500 mg via ORAL
  Filled 2023-11-26 (×14): qty 1

## 2023-11-26 MED ORDER — PREGABALIN 75 MG PO CAPS
150.0000 mg | ORAL_CAPSULE | Freq: Three times a day (TID) | ORAL | Status: DC
  Administered 2023-11-26 – 2023-12-01 (×14): 150 mg via ORAL
  Filled 2023-11-26 (×14): qty 2

## 2023-11-26 MED ORDER — ATORVASTATIN CALCIUM 20 MG PO TABS
80.0000 mg | ORAL_TABLET | Freq: Every day | ORAL | Status: DC
  Administered 2023-11-26 – 2023-12-03 (×8): 80 mg via ORAL
  Filled 2023-11-26 (×7): qty 4

## 2023-11-26 MED ORDER — INSULIN ASPART 100 UNIT/ML IJ SOLN
3.0000 [IU] | Freq: Three times a day (TID) | INTRAMUSCULAR | Status: DC
Start: 2023-11-26 — End: 2023-12-01
  Administered 2023-11-26 – 2023-12-01 (×13): 3 [IU] via SUBCUTANEOUS
  Filled 2023-11-26: qty 0.03

## 2023-11-26 MED ORDER — ASPIRIN 81 MG PO TBEC
81.0000 mg | DELAYED_RELEASE_TABLET | Freq: Every day | ORAL | Status: DC
  Administered 2023-11-27 – 2023-12-03 (×7): 81 mg via ORAL
  Filled 2023-11-26 (×7): qty 1

## 2023-11-26 MED ORDER — ENOXAPARIN SODIUM 80 MG/0.8ML IJ SOSY
70.0000 mg | PREFILLED_SYRINGE | Freq: Every day | INTRAMUSCULAR | Status: DC
  Administered 2023-11-26: 70 mg via SUBCUTANEOUS
  Filled 2023-11-26: qty 0.8

## 2023-11-26 MED ORDER — OXYCODONE HCL 5 MG PO TABS
10.0000 mg | ORAL_TABLET | Freq: Once | ORAL | Status: AC
  Administered 2023-11-26: 10 mg via ORAL
  Filled 2023-11-26: qty 2

## 2023-11-26 MED ORDER — ESCITALOPRAM OXALATE 20 MG PO TABS
10.0000 mg | ORAL_TABLET | Freq: Every day | ORAL | Status: DC
  Administered 2023-11-26 – 2023-12-01 (×6): 10 mg via ORAL
  Filled 2023-11-26 (×6): qty 1

## 2023-11-26 MED ORDER — HYDROMORPHONE HCL 1 MG/ML IJ SOLN
0.5000 mg | Freq: Four times a day (QID) | INTRAMUSCULAR | Status: DC | PRN
  Administered 2023-11-28: 0.5 mg via INTRAVENOUS
  Filled 2023-11-26: qty 0.5

## 2023-11-26 MED ORDER — PANTOPRAZOLE SODIUM 40 MG PO TBEC
40.0000 mg | DELAYED_RELEASE_TABLET | Freq: Every day | ORAL | Status: DC
  Administered 2023-11-26 – 2023-12-03 (×8): 40 mg via ORAL
  Filled 2023-11-26 (×8): qty 1

## 2023-11-26 MED ORDER — INSULIN ASPART 100 UNIT/ML IJ SOLN
0.0000 [IU] | Freq: Three times a day (TID) | INTRAMUSCULAR | Status: DC
  Administered 2023-11-26: 5 [IU] via SUBCUTANEOUS
  Administered 2023-11-28 (×2): 3 [IU] via SUBCUTANEOUS
  Administered 2023-11-29: 2 [IU] via SUBCUTANEOUS
  Administered 2023-11-30: 5 [IU] via SUBCUTANEOUS
  Administered 2023-11-30: 8 [IU] via SUBCUTANEOUS
  Administered 2023-11-30: 3 [IU] via SUBCUTANEOUS
  Administered 2023-12-01: 2 [IU] via SUBCUTANEOUS
  Administered 2023-12-02 – 2023-12-03 (×3): 3 [IU] via SUBCUTANEOUS
  Administered 2023-12-03: 5 [IU] via SUBCUTANEOUS
  Filled 2023-11-26: qty 0.15

## 2023-11-26 MED ORDER — ALBUTEROL SULFATE (2.5 MG/3ML) 0.083% IN NEBU: 3.0000 mL | INHALATION_SOLUTION | Freq: Four times a day (QID) | RESPIRATORY_TRACT | Status: DC | PRN

## 2023-11-26 MED ORDER — ALPRAZOLAM 0.5 MG PO TABS
2.0000 mg | ORAL_TABLET | Freq: Every day | ORAL | Status: DC
Start: 2023-11-26 — End: 2023-12-01
  Administered 2023-11-26 – 2023-11-30 (×5): 2 mg via ORAL
  Filled 2023-11-26 (×5): qty 4

## 2023-11-26 MED ORDER — ACETAMINOPHEN 650 MG RE SUPP: 650.0000 mg | Freq: Four times a day (QID) | RECTAL | Status: DC | PRN

## 2023-11-26 MED ORDER — SENNOSIDES-DOCUSATE SODIUM 8.6-50 MG PO TABS: 1.0000 | ORAL_TABLET | Freq: Every evening | ORAL | Status: DC | PRN

## 2023-11-26 MED ORDER — ACETAMINOPHEN 325 MG PO TABS: 650.0000 mg | ORAL_TABLET | Freq: Four times a day (QID) | ORAL | Status: DC | PRN

## 2023-11-26 MED ORDER — OXYCODONE HCL 5 MG PO TABS
15.0000 mg | ORAL_TABLET | ORAL | Status: DC | PRN
  Administered 2023-11-26 – 2023-12-01 (×17): 15 mg via ORAL
  Filled 2023-11-26 (×17): qty 3

## 2023-11-26 MED ORDER — INSULIN ASPART 100 UNIT/ML IJ SOLN
0.0000 [IU] | Freq: Every day | INTRAMUSCULAR | Status: DC
  Administered 2023-11-30 – 2023-12-02 (×2): 3 [IU] via SUBCUTANEOUS
  Filled 2023-11-26: qty 0.05

## 2023-11-26 MED ORDER — ONDANSETRON HCL 4 MG/2ML IJ SOLN: 4.0000 mg | Freq: Four times a day (QID) | INTRAMUSCULAR | Status: DC | PRN

## 2023-11-26 MED ORDER — METFORMIN HCL 500 MG PO TABS
1000.0000 mg | ORAL_TABLET | Freq: Two times a day (BID) | ORAL | Status: DC
  Administered 2023-11-26 – 2023-11-27 (×2): 1000 mg via ORAL
  Filled 2023-11-26 (×2): qty 2

## 2023-11-26 MED ORDER — SODIUM CHLORIDE 0.9 % IV SOLN
2.0000 g | INTRAVENOUS | Status: DC
  Administered 2023-11-26 – 2023-11-30 (×5): 2 g via INTRAVENOUS
  Filled 2023-11-26 (×5): qty 20

## 2023-11-26 MED ORDER — ICOSAPENT ETHYL 1 G PO CAPS
2.0000 g | ORAL_CAPSULE | Freq: Two times a day (BID) | ORAL | Status: DC
  Administered 2023-11-27 – 2023-12-03 (×13): 2 g via ORAL
  Filled 2023-11-26 (×13): qty 2

## 2023-11-26 MED ORDER — PREDNISOLONE ACETATE 1 % OP SUSP
1.0000 [drp] | Freq: Four times a day (QID) | OPHTHALMIC | Status: DC
  Filled 2023-11-26: qty 5

## 2023-11-26 MED ORDER — TIZANIDINE HCL 4 MG PO TABS
4.0000 mg | ORAL_TABLET | Freq: Three times a day (TID) | ORAL | Status: DC
Start: 2023-11-26 — End: 2023-12-01
  Administered 2023-11-27 – 2023-12-01 (×12): 4 mg via ORAL
  Filled 2023-11-26 (×12): qty 1

## 2023-11-26 MED ORDER — CLOPIDOGREL BISULFATE 75 MG PO TABS
75.0000 mg | ORAL_TABLET | Freq: Every day | ORAL | Status: DC
  Administered 2023-11-26 – 2023-12-03 (×8): 75 mg via ORAL
  Filled 2023-11-26 (×8): qty 1

## 2023-11-26 NOTE — ED Triage Notes (Signed)
Pt reports with hematuria since this morning. Pt states that he has had back and flank pain bilaterally x 3 weeks.

## 2023-11-26 NOTE — H&P (Addendum)
History and Physical    Patient: Joseph [name] ZOX:[id number] DOB: 01-16-1971 DOA: 11/26/2023 DOS: the patient was seen and examined on 11/26/2023 PCP: Cain Saupe, MD  Patient coming from: Home  Chief Complaint:  Chief Complaint  Patient presents with   Hematuria   HPI: Joseph [name] is a 52 y.o. male with medical history significant of morbid obesity, COPD, HFpEF, CAD s/p CABG, anxiety and depression, T2DM complicated by bilateral BKA and peripheral neuropathy, HLD, HTN, GERD, chronic pain and bicuspid aortic valve who presented to the ED for evaluation of lower abdominal pain and hematuria.  Patient states over the last 3 to 4 weeks, he has had intermittent back pain as well as lower abdominal pain. He also reported having occasional dysuria during this time. Yesterday, he noticed blood in his urine as well as some bubbles and occasional clots. He denies any fevers, chills, shortness of breath, headaches, dizziness, bloody stools or hematemesis.  ED course: Initial vitals with temp 97.8, RR 19, HR 89, BP 168/95, SpO2 97% on room air. Labs showed WBC 12.7, Hgb 13.6, platelet 328, sodium 135, K+ 5.2, bicarb 20, glucose 221, BUN/creatinine 46/2.13 (from 55/2.23 6 months ago), UA showed significant glucosuria, proteinuria and hemoglobinuria, positive nitrite, moderate leuks, RBC >50, WBC >50 and many bacteria CT A/P shows emphysematous cystitis but no superimposed obstructive uropathy. Urology was consulted for evaluation [facility] was consulted for admission  Review of Systems: As mentioned in the history of present illness. All other systems reviewed and are negative. Past Medical History:  Diagnosis Date   Aneurysm of ascending aorta (HCC) 09/14/2021   Anxiety    Arthritis    "knees, shoulders, hips, ankles" (07/29/2016)   Asthma    Bicuspid aortic valve 09/14/2021   CAD (coronary artery disease)    a. 2017: s/p BMS to distal Cx; b. LHC 05/30/2019: 80% mid, distal RCA s/p DES, 30%  narrowing of d LM, widely patent LAD w/ luminal irregularities, widely patent stent in dCX  w/ 90+% stenosis distal to stent beofre small trifurcating obtuse marginal (potentially area of restenosis)   Chronic bronchitis (HCC)    Chronic ulcer of great toe of right foot (HCC) 09/09/2017   Chronic ulcer of right great toe (HCC) 09/19/2017   COPD (chronic obstructive pulmonary disease) (HCC)    Coronary arteriosclerosis    Depression    Diabetes mellitus (HCC)    DVT (deep venous thrombosis) (HCC)    GERD (gastroesophageal reflux disease)    Hyperlipidemia    Hypertension    Migraine    Morbid obesity (HCC)    Myocardial infarction (HCC) 1996   "light one"   PE (pulmonary embolism) 04/2013   On chronic Xarelto   Peripheral nerve disease    Pneumonia "several times"   Sleep apnea    Type II diabetes mellitus (HCC)    Past Surgical History:  Procedure Laterality Date   ABDOMINAL AORTOGRAM W/LOWER EXTREMITY N/A 10/10/2020   Procedure: ABDOMINAL AORTOGRAM W/LOWER EXTREMITY;  Surgeon: Sherren Kerns, MD;  Location: [facility];  Service: Cardiovascular;  Laterality: N/A;   AMPUTATION Right 09/21/2017   Procedure: RIGHT GREAT TOE AMPUTATION, POSSIBLE VAC;  Surgeon: Tarry Kos, MD;  Location: [facility] OR;  Service: Orthopedics;  Laterality: Right;   AMPUTATION Left 08/13/2020   Procedure: LEFT FOOT 4TH RAY AMPUTATION;  Surgeon: [name], MD;  Location: [facility] OR;  Service: Orthopedics;  Laterality: Left;   AMPUTATION Left 11/20/2021   Procedure: LEFT TRANSMETATARSAL AMPUTATION;  Surgeon: [name], MD;  Location: [facility] OR;  Service: Orthopedics;  Laterality: Left;   AMPUTATION Left 01/08/2022   Procedure: LEFT BELOW KNEE AMPUTATION;  Surgeon: [name], MD;  Location: [facility] OR;  Service: Orthopedics;  Laterality: Left;   AMPUTATION Right 07/07/2022   Procedure: RIGHT BELOW KNEE AMPUTATION;  Surgeon: [name], MD;  Location: [facility] OR;  Service: Orthopedics;  Laterality: Right;    AORTOGRAM Bilateral 03/13/2021   Procedure: ABDOMINAL AORTOGRAM WITH Left LOWER EXTREMITY RUNOFF;  Surgeon: Leonie Douglas, MD;  Location: [facility] OR;  Service: Vascular;  Laterality: Bilateral;   APPLICATION OF WOUND VAC  01/08/2022   Procedure: APPLICATION OF WOUND VAC;  Surgeon: [name], MD;  Location: [facility] OR;  Service: Orthopedics;;   APPLICATION OF WOUND VAC Right 07/07/2022   Procedure: APPLICATION OF WOUND VAC;  Surgeon: [name], MD;  Location: [facility] OR;  Service: Orthopedics;  Laterality: Right;   CARDIAC CATHETERIZATION  2006   CARDIAC CATHETERIZATION  1996   "@ [facility]; when I had my heart attack"   CARDIAC CATHETERIZATION N/A 07/29/2016   Procedure: Left Heart Cath and Coronary Angiography;  Surgeon: Runell Gess, MD;  Location: [facility] INVASIVE CV LAB;  Service: Cardiovascular;  Laterality: N/A;   CARDIAC CATHETERIZATION N/A 07/29/2016   Procedure: Coronary Stent Intervention;  Surgeon: Runell Gess, MD;  Location: [facility];  Service: Cardiovascular;  Laterality: N/A;   CARPAL TUNNEL RELEASE Bilateral    CORONARY ANGIOPLASTY WITH STENT PLACEMENT  07/29/2016   CORONARY ARTERY BYPASS GRAFT N/A 07/24/2020   Procedure: CORONARY ARTERY BYPASS GRAFTING (CABG), ON PUMP, TIMES FOUR, USING LEFT INTERNAL MAMMARY ARTERY AND ENDOSCOPICALLY HARVESTED RIGHT GREATER SAPHENOUS VEIN;  Surgeon: Corliss Skains, MD;  Location: [facility] OR;  Service: Open Heart Surgery;  Laterality: N/A;  FLOW TAC   CORONARY PRESSURE/FFR STUDY N/A 07/22/2020   Procedure: INTRAVASCULAR PRESSURE WIRE/FFR STUDY;  Surgeon: Marykay Lex, MD;  Location: [facility] INVASIVE CV LAB;  Service: Cardiovascular;  Laterality: N/A;   CORONARY STENT INTERVENTION N/A 05/30/2019   Procedure: CORONARY STENT INTERVENTION;  Surgeon: Lyn Records, MD;  Location: [facility];  Service: Cardiovascular;  Laterality: N/A;   CORONARY STENT INTERVENTION N/A 11/02/2019   Procedure: CORONARY STENT INTERVENTION;  Surgeon: Iran Ouch, MD;  [facility]: [facility];  Service: Cardiovascular;  Laterality: N/A;   ESOPHAGOGASTRODUODENOSCOPY N/A 09/22/2017   Procedure: ESOPHAGOGASTRODUODENOSCOPY (EGD);  Surgeon: Meryl Dare, MD;  Location: [facility] ENDOSCOPY;  Service: Endoscopy;  Laterality: N/A;   KNEE ARTHROSCOPY Bilateral    "2 on left; 1 on the right"   LEFT HEART CATH AND CORONARY ANGIOGRAPHY N/A 11/02/2019   Procedure: LEFT HEART CATH AND CORONARY ANGIOGRAPHY;  Surgeon: Iran Ouch, MD;  Location: [facility];  Service: Cardiovascular;  Laterality: N/A;   LEFT HEART CATH AND CORONARY ANGIOGRAPHY N/A 07/22/2020   Procedure: LEFT HEART CATH AND CORONARY ANGIOGRAPHY;  Surgeon: Marykay Lex, MD;  Location: [facility] INVASIVE CV LAB;  Service: Cardiovascular;  Laterality: N/A;   LEFT HEART CATH AND CORS/GRAFTS ANGIOGRAPHY N/A 08/17/2021   Procedure: LEFT HEART CATH AND CORS/GRAFTS ANGIOGRAPHY;  Surgeon: Yvonne Kendall, MD;  Location: [facility];  Service: Cardiovascular;  Laterality: N/A;   PERIPHERAL VASCULAR INTERVENTION Left 10/11/2020   popliteal and SFA stent placement    PERIPHERAL VASCULAR INTERVENTION Left 10/10/2020   Procedure: PERIPHERAL VASCULAR INTERVENTION;  Surgeon: Sherren Kerns, MD;  Location: [facility] INVASIVE  CV LAB;  Service: Cardiovascular;  Laterality: Left;   RIGHT/LEFT HEART CATH AND CORONARY ANGIOGRAPHY N/A 05/30/2019   Procedure: RIGHT/LEFT HEART CATH AND CORONARY ANGIOGRAPHY;  Surgeon: Lyn Records, MD;  [facility]: [facility];  Service: Cardiovascular;  Laterality: N/A;   RIGHT/LEFT HEART CATH AND CORONARY/GRAFT ANGIOGRAPHY N/A 06/17/2022   Procedure: RIGHT/LEFT HEART CATH AND CORONARY/GRAFT ANGIOGRAPHY;  Surgeon: Lennette Bihari, MD;  Location: [facility];  Service: Cardiovascular;  Laterality: N/A;   SHOULDER OPEN ROTATOR CUFF REPAIR Bilateral    TEE WITHOUT CARDIOVERSION N/A 07/24/2020   Procedure: TRANSESOPHAGEAL ECHOCARDIOGRAM (TEE);  Surgeon: Corliss Skains,  MD;  Location: [facility] OR;  Service: Open Heart Surgery;  Laterality: N/A;   Social History:  reports that he quit smoking about 3 years ago. His smoking use included cigarettes. He started smoking about 37 years ago. He has a 34 pack-year smoking history. He has quit using smokeless tobacco.  His smokeless tobacco use included snuff and chew. He reports current alcohol use. He reports that he does not use drugs.  Allergies  Allergen Reactions   Sulfa Antibiotics Anaphylaxis and Other (See Comments)    Headaches     Family History  Problem Relation Age of Onset   Hypertension Brother    Hypertension Father    Diabetes Other    Hyperlipidemia Other     Prior to Admission medications   Medication Sig Start Date End Date Taking? Authorizing Provider  acetaminophen (TYLENOL) 500 MG tablet Take 1,000 mg by mouth 3 (three) times daily as needed for headache.   Yes [provider]  albuterol (VENTOLIN HFA) 108 (90 Base) MCG/ACT inhaler Inhale 2 puffs into the lungs every 6 (six) hours as needed for shortness of breath.   Yes [provider]  ALPRAZolam Prudy Feeler) 1 MG tablet Take 2 mg by mouth at bedtime.   Yes [provider]  aspirin EC 81 MG tablet Take 81 mg by mouth in the morning. Swallow whole.   Yes [provider]  atorvastatin (LIPITOR) 80 MG tablet TAKE ONE TABLET (80 MG TOTAL) BY MOUTH DAILY AT 6PM APPOINTMENT OVERDUE: NEEDS APPOINTMENT FOR FURTHER REFILLS PLEASE CALL OFFICE Patient taking differently: Take 80 mg by mouth daily. 11/25/23  Yes Weaver, Scott T, PA-C  carvedilol (COREG) 6.25 MG tablet TAKE ONE TABLET (6.25 MG TOTAL) BY MOUTH TWICE DAILY @9AM -5PM Patient taking differently: Take 6.25 mg by mouth 2 (two) times daily with a meal. 11/25/23  Yes Weaver, Scott T, PA-C  clopidogrel (PLAVIX) 75 MG tablet TAKE ONE TABLET (75 MG TOTAL) BY MOUTH DAILY AT 9AM WITH BREAKFAST APPOINTMENT OVERDUE: NEEDS APPOINTMENT FOR FURTHER REFILLS, PLEASE CALL  OFFICE Patient taking differently: Take 75 mg by mouth daily. 11/25/23  Yes Weaver, Scott T, PA-C  dapagliflozin propanediol (FARXIGA) 10 MG TABS tablet Take 1 tablet (10 mg total) by mouth daily. 10/27/23  Yes Weaver, Scott T, PA-C  escitalopram (LEXAPRO) 10 MG tablet Take 10 mg by mouth daily. 01/27/22  Yes [provider]  fluconazole (DIFLUCAN) 100 MG tablet Take 1 tablet by mouth once a week.   Yes [provider]  fluticasone (FLONASE) 50 MCG/ACT nasal spray Place 2 sprays into both nostrils daily.   Yes [provider]  icosapent Ethyl (VASCEPA) 1 g capsule Take 2 capsules (2 g total) by mouth 2 (two) times daily. 10/27/23  Yes Weaver, Scott T, PA-C  insulin aspart (NOVOLOG) 100 UNIT/ML injection Inject 3 Units into the skin 3 (three)  times daily with meals. 07/13/22  Yes Dahal, Melina Schools, MD  insulin glargine (LANTUS SOLOSTAR) 100 UNIT/ML Solostar Pen Inject 35 Units into the skin 2 (two) times daily. 05/03/23  Yes Dameron, Nolberto Hanlon, DO  lidocaine (LIDODERM) 5 % Place 1 patch onto the skin daily.   Yes [provider]  loratadine (CLARITIN) 10 MG tablet Take 10 mg by mouth daily. 09/27/22  Yes [provider]  meloxicam (MOBIC) 15 MG tablet Take 15 mg by mouth daily. 09/23/23 12/21/23 Yes [provider]  metFORMIN (GLUCOPHAGE) 1000 MG tablet Take 1 tablet (1,000 mg total) by mouth 2 (two) times daily with a meal. 11/05/19  Yes Leone Brand, NP  naloxone [place]) nasal spray 4 mg/0.1 mL Place 4 mg into the nose as needed (overdose).   Yes [provider]  nitroGLYCERIN (NITROSTAT) 0.4 MG SL tablet Place 1 tablet (0.4 mg total) under the tongue every 5 (five) minutes as needed. Patient taking differently: Place 0.4 mg under the tongue every 5 (five) minutes as needed for chest pain. 10/08/22  Yes Gaston Islam., NP  oxyCODONE (ROXICODONE) 15 MG immediate release tablet Take 1 tablet (15 mg total) by mouth every 4 (four) hours as needed for  moderate pain (pain score 4-6). 07/13/22  Yes Dahal, Melina Schools, MD  pantoprazole (PROTONIX) 40 MG tablet Take 1 tablet (40 mg total) by mouth daily. 09/23/17  Yes Hongalgi, Maximino Greenland, MD  prednisoLONE acetate (PRED FORTE) 1 % ophthalmic suspension Place 1 drop into the left eye 4 (four) times daily.   Yes [provider]  pregabalin (LYRICA) 150 MG capsule Take 150 mg by mouth 3 (three) times daily. 09/10/22  Yes [provider]  promethazine (PHENERGAN) 25 MG tablet Take 25 mg by mouth every 4 (four) hours as needed for nausea or vomiting. 09/10/22  Yes [provider]  ranolazine (RANEXA) 500 MG 12 hr tablet Take 1 tablet (500 mg total) by mouth 2 (two) times daily. 10/27/23  Yes Weaver, Scott T, PA-C  tiZANidine (ZANAFLEX) 4 MG tablet Take 4 mg by mouth 3 (three) times daily. 11/25/23  Yes [provider]  buPROPion (WELLBUTRIN XL) 150 MG 24 hr tablet Take 150 mg by mouth in the morning and at bedtime.    [provider]  Continuous Blood Gluc Sensor (FREESTYLE LIBRE 14 DAY SENSOR) MISC Apply topically 4 (four) times daily. 09/23/22   [provider]  Insulin Pen Needle 32G X 4 MM MISC Use as directed with insulin pens 05/03/23   Dameron, Nolberto Hanlon, DO  torsemide (DEMADEX) 20 MG tablet TAKE THREE TABLETS (60 MG TOTAL) BY MOUTH DAILY AT 9AM APPOINTMENT OVERDUE: NEEDS APPOINTMENT FOR FURTHER REFILLS PLEASE CALL OFFICE Patient not taking: Reported on 11/26/2023 11/25/23   Tereso Newcomer T, PA-C    Physical Exam: Vitals:   11/26/23 1400 11/26/23 1512 11/26/23 1550 11/26/23 1614  BP: 138/78  (!) 164/100   Pulse: 88  88   Resp: 16  18   Temp:  97.9 F (36.6 C) (!) 97.5 F (36.4 C)   TempSrc:  Oral    SpO2: 100%  97%   Weight:    (!) 174 kg  Height:    5\' 1"  (1.549 m)   General: Pleasant, well-appearing morbidly obese man laying in bed. No acute distress. HEENT: [place]/AT. Anicteric sclera CV: RRR. Soft systolic murmur. No LE edema Pulmonary: Lungs CTAB.  Normal effort. No wheezing or rales. Decreased air movement throughout. Abdominal: Soft, nontender, nondistended. Normal bowel  sounds. Extremities: Bilateral BKA. No signs of infection around the stumps. Skin: Warm and dry. No obvious rash or lesions. Neuro: A&Ox3. Moves all extremities. Decreased sensation around both stumps up to the mid thigh. Psych: Normal mood and affect  Data Reviewed: Labs showed WBC 12.7, Hgb 13.6, platelet 328, sodium 135, K+ 5.2, bicarb 20, glucose 221, BUN/creatinine 46/2.13 (from 55/2.23 6 months ago), UA showed significant glucosuria, proteinuria and hemoglobinuria, positive nitrite, moderate leuks, RBC >50, WBC >50 and many bacteria CT A/P shows emphysematous cystitis but no superimposed obstructive uropathy.  Assessment and Plan: Joseph [name] is a 52 y.o. male with medical history significant of morbid obesity, COPD, HFpEF, CAD s/p CABG, anxiety and depression, T2DM complicated by bilateral BKA and peripheral neuropathy, HLD, HTN, GERD, chronic pain, and bicuspid aortic valve who presented to the ED for evaluation of lower abdominal pain and hematuria and admitted for emphysematous cystitis.  # Emphysematous cystitis Morbidly obese middle-age man presenting with 3 to 4 weeks of lower abdominal pain, back pain and dysuria with 1 day of hematuria found to have evidence of UTI on UA and imaging showing emphysematous cystitis. He remains hemodynamically stable with mild leukocytosis. Urology has been consulted and recommend placement of Foley catheter, broad-spectrum antibiotics and observation for the next 24 to 48 hours. -Urology following, appreciate recs -Continue Foley catheter -Continue IV Rocephin -Follow-up urine culture -Trend CBC, fever curve  # T2DM Last A1c 7.4% 7 months ago. Blood sugar elevated to the 200s.  Home regimen includes Lantus 35 units twice daily and NovoLog 3 units with meals as well as Marcelline Deist and metformin. -Semglee 35 units twice  daily -NovoLog 3 units 3 times daily with meals -SSI with meals, CBG monitoring -Continue metformin 1000 mg twice daily -Hold Farxiga in the setting of UTI -Follow-up repeat A1c  # HTN BP elevated with SBP in the 130s to 160s. Will anticipate some improvement with pain control. -Continue Coreg  # HFpEF # Bicuspid aortic valve Patient euvolemic on exam -Continue Coreg -Hold Farxiga as above in the setting of UTI  # CAD s/p CABG # HLD -Continue Ranexa, aspirin, atorvastatin, Vascepa and Plavix  # Chronic pain -Continue home oxycodone, Lyrica and tizanidine -As needed IV Dilaudid for breakthrough pain  # Anxiety and depression -Continue Xanax daily at bedtime -Continue Lexapro  # GERD -Continue Protonix  # Class III obesity Body mass index is 72.48 kg/m. Last Weight  Most recent update: 11/26/2023  4:50 PM    Weight  174 kg (383 lb 9.6 oz)             -PCP follow-up for nutrition counseling and weight management    Advance Care Planning:   Code Status: Full Code   Consults: Urology  Family Communication: No family at bedside  Severity of Illness: The appropriate patient status for this patient is OBSERVATION. Observation status is judged to be reasonable and necessary in order to provide the required intensity of service to ensure the patient's safety. The patient's presenting symptoms, physical exam findings, and initial radiographic and laboratory data in the context of their medical condition is felt to place them at decreased risk for further clinical deterioration. Furthermore, it is anticipated that the patient will be medically stable for discharge from the hospital within 2 midnights of admission.   Author: Steffanie Rainwater, MD 11/26/2023 6:17 PM  For on call review [email or web address].

## 2023-11-26 NOTE — Consult Note (Cosign Needed)
Urology Consult Note   Requesting Attending Physician:  Derwood Kaplan, MD Service Providing Consult: Urology  Consulting Attending: Rhunette Croft  Reason for Consult:  Emphysematous Cystitis  HPI: Joseph [name] is seen in consultation for reasons noted above at the request of Derwood Kaplan, MD for emphysematous cystitis.  Patient is a pleasant 52 year old male with significant past medical history, including CAD, diabetes, bilateral lower extremity amputation, DVT on anticoagulation.  Patient reports approximately 3-week history of ongoing hematuria.  He also states that "I feel like I am blowing milk bubbles, when voiding.  He states that he has not had any fevers or chills, but given his ongoing hematuria, he decided to present to the emergency department  In the emergency department, he is hemodynamically stable.  He is afebrile, normotensive, and satting well on room air.  He has a mild white count of 12.7, and his creatinine is at his appropriate baseline of 2.13.  His urinalysis is grossly concerning for infection.  His review of systems is otherwise negative.  He does not have any history of recurrent UTIs.  No fevers, chills, nausea or vomiting.   Past Medical History: Past Medical History:  Diagnosis Date   Aneurysm of ascending aorta (HCC) 09/14/2021   Anxiety    Arthritis    "knees, shoulders, hips, ankles" (07/29/2016)   Asthma    Bicuspid aortic valve 09/14/2021   CAD (coronary artery disease)    a. 2017: s/p BMS to distal Cx; b. LHC 05/30/2019: 80% mid, distal RCA s/p DES, 30% narrowing of d LM, widely patent LAD w/ luminal irregularities, widely patent stent in dCX  w/ 90+% stenosis distal to stent beofre small trifurcating obtuse marginal (potentially area of restenosis)   Chronic bronchitis (HCC)    Chronic ulcer of great toe of right foot (HCC) 09/09/2017   Chronic ulcer of right great toe (HCC) 09/19/2017   COPD (chronic obstructive pulmonary disease) (HCC)     Coronary arteriosclerosis    Depression    Diabetes mellitus (HCC)    DVT (deep venous thrombosis) (HCC)    GERD (gastroesophageal reflux disease)    Hyperlipidemia    Hypertension    Migraine    Morbid obesity (HCC)    Myocardial infarction (HCC) 1996   "light one"   PE (pulmonary embolism) 04/2013   On chronic Xarelto   Peripheral nerve disease    Pneumonia "several times"   Sleep apnea    Type II diabetes mellitus (HCC)     Past Surgical History:  Past Surgical History:  Procedure Laterality Date   ABDOMINAL AORTOGRAM W/LOWER EXTREMITY N/A 10/10/2020   Procedure: ABDOMINAL AORTOGRAM W/LOWER EXTREMITY;  Surgeon: Sherren Kerns, MD;  Location: [facility];  Service: Cardiovascular;  Laterality: N/A;   AMPUTATION Right 09/21/2017   Procedure: RIGHT GREAT TOE AMPUTATION, POSSIBLE VAC;  Surgeon: Tarry Kos, MD;  Location: [facility] OR;  Service: Orthopedics;  Laterality: Right;   AMPUTATION Left 08/13/2020   Procedure: LEFT FOOT 4TH RAY AMPUTATION;  Surgeon: [name], MD;  Location: [facility] OR;  Service: Orthopedics;  Laterality: Left;   AMPUTATION Left 11/20/2021   Procedure: LEFT TRANSMETATARSAL AMPUTATION;  Surgeon: [name], MD;  Location: [facility] OR;  Service: Orthopedics;  Laterality: Left;   AMPUTATION Left 01/08/2022   Procedure: LEFT BELOW KNEE AMPUTATION;  Surgeon: [name], MD;  Location: [facility] OR;  Service: Orthopedics;  Laterality: Left;   AMPUTATION Right 07/07/2022   Procedure: RIGHT BELOW KNEE AMPUTATION;  Surgeon: [name], MD;  Location: [facility] OR;  Service: Orthopedics;  Laterality: Right;   AORTOGRAM Bilateral 03/13/2021   Procedure: ABDOMINAL AORTOGRAM WITH Left LOWER EXTREMITY RUNOFF;  Surgeon: Leonie Douglas, MD;  Location: [facility] OR;  Service: Vascular;  Laterality: Bilateral;   APPLICATION OF WOUND VAC  01/08/2022   Procedure: APPLICATION OF WOUND VAC;  Surgeon: [name], MD;  Location: [facility] OR;  Service: Orthopedics;;   APPLICATION OF WOUND VAC  Right 07/07/2022   Procedure: APPLICATION OF WOUND VAC;  Surgeon: [name], MD;  Location: [facility] OR;  Service: Orthopedics;  Laterality: Right;   CARDIAC CATHETERIZATION  2006   CARDIAC CATHETERIZATION  1996   "@ [facility]; when I had my heart attack"   CARDIAC CATHETERIZATION N/A 07/29/2016   Procedure: Left Heart Cath and Coronary Angiography;  Surgeon: Runell Gess, MD;  Location: [facility] INVASIVE CV LAB;  Service: Cardiovascular;  Laterality: N/A;   CARDIAC CATHETERIZATION N/A 07/29/2016   Procedure: Coronary Stent Intervention;  Surgeon: Runell Gess, MD;  Location: [facility];  Service: Cardiovascular;  Laterality: N/A;   CARPAL TUNNEL RELEASE Bilateral    CORONARY ANGIOPLASTY WITH STENT PLACEMENT  07/29/2016   CORONARY ARTERY BYPASS GRAFT N/A 07/24/2020   Procedure: CORONARY ARTERY BYPASS GRAFTING (CABG), ON PUMP, TIMES FOUR, USING LEFT INTERNAL MAMMARY ARTERY AND ENDOSCOPICALLY HARVESTED RIGHT GREATER SAPHENOUS VEIN;  Surgeon: Corliss Skains, MD;  Location: [facility] OR;  Service: Open Heart Surgery;  Laterality: N/A;  FLOW TAC   CORONARY PRESSURE/FFR STUDY N/A 07/22/2020   Procedure: INTRAVASCULAR PRESSURE WIRE/FFR STUDY;  Surgeon: Marykay Lex, MD;  Location: [facility] INVASIVE CV LAB;  Service: Cardiovascular;  Laterality: N/A;   CORONARY STENT INTERVENTION N/A 05/30/2019   Procedure: CORONARY STENT INTERVENTION;  Surgeon: Lyn Records, MD;  Location: [facility];  Service: Cardiovascular;  Laterality: N/A;   CORONARY STENT INTERVENTION N/A 11/02/2019   Procedure: CORONARY STENT INTERVENTION;  Surgeon: Iran Ouch, MD;  Location: [facility];  Service: Cardiovascular;  Laterality: N/A;   ESOPHAGOGASTRODUODENOSCOPY N/A 09/22/2017   Procedure: ESOPHAGOGASTRODUODENOSCOPY (EGD);  Surgeon: Meryl Dare, MD;  Location: [facility] ENDOSCOPY;  Service: Endoscopy;  Laterality: N/A;   KNEE ARTHROSCOPY Bilateral    "2 on left; 1 on the right"   LEFT HEART CATH AND CORONARY  ANGIOGRAPHY N/A 11/02/2019   Procedure: LEFT HEART CATH AND CORONARY ANGIOGRAPHY;  Surgeon: Iran Ouch, MD;  Location: [facility];  Service: Cardiovascular;  Laterality: N/A;   LEFT HEART CATH AND CORONARY ANGIOGRAPHY N/A 07/22/2020   Procedure: LEFT HEART CATH AND CORONARY ANGIOGRAPHY;  Surgeon: Marykay Lex, MD;  Location: [facility] INVASIVE CV LAB;  Service: Cardiovascular;  Laterality: N/A;   LEFT HEART CATH AND CORS/GRAFTS ANGIOGRAPHY N/A 08/17/2021   Procedure: LEFT HEART CATH AND CORS/GRAFTS ANGIOGRAPHY;  Surgeon: Yvonne Kendall, MD;  Location: [facility];  Service: Cardiovascular;  Laterality: N/A;   PERIPHERAL VASCULAR INTERVENTION Left 10/11/2020   popliteal and SFA stent placement    PERIPHERAL VASCULAR INTERVENTION Left 10/10/2020   Procedure: PERIPHERAL VASCULAR INTERVENTION;  Surgeon: Sherren Kerns, MD;  Location: [facility];  Service: Cardiovascular;  Laterality: Left;   RIGHT/LEFT HEART CATH AND CORONARY ANGIOGRAPHY N/A 05/30/2019   Procedure: RIGHT/LEFT HEART CATH AND CORONARY ANGIOGRAPHY;  Surgeon: Lyn Records, MD;  Location: [facility];  Service: Cardiovascular;  Laterality: N/A;   RIGHT/LEFT HEART CATH AND CORONARY/GRAFT ANGIOGRAPHY N/A 06/17/2022  Procedure: RIGHT/LEFT HEART CATH AND CORONARY/GRAFT ANGIOGRAPHY;  Surgeon: Lennette Bihari, MD;  Location: [facility] INVASIVE CV LAB;  Service: Cardiovascular;  Laterality: N/A;   SHOULDER OPEN ROTATOR CUFF REPAIR Bilateral    TEE WITHOUT CARDIOVERSION N/A 07/24/2020   Procedure: TRANSESOPHAGEAL ECHOCARDIOGRAM (TEE);  Surgeon: Corliss Skains, MD;  Location: [facility] OR;  Service: Open Heart Surgery;  Laterality: N/A;    Medication: No current facility-administered medications for this encounter.   Current Outpatient Medications  Medication Sig Dispense Refill   acetaminophen (TYLENOL) 500 MG tablet Take 1,000 mg by mouth 3 (three) times daily as needed for headache.     albuterol (VENTOLIN HFA)  108 (90 Base) MCG/ACT inhaler Inhale 2 puffs into the lungs every 6 (six) hours as needed for shortness of breath.     ALPRAZolam (XANAX) 1 MG tablet Take 2 mg by mouth at bedtime.     aspirin EC 81 MG tablet Take 81 mg by mouth in the morning. Swallow whole.     atorvastatin (LIPITOR) 80 MG tablet TAKE ONE TABLET (80 MG TOTAL) BY MOUTH DAILY AT 6PM APPOINTMENT OVERDUE: NEEDS APPOINTMENT FOR FURTHER REFILLS PLEASE CALL OFFICE 30 tablet 1   buPROPion (WELLBUTRIN XL) 150 MG 24 hr tablet Take 150 mg by mouth in the morning and at bedtime.     carvedilol (COREG) 6.25 MG tablet TAKE ONE TABLET (6.25 MG TOTAL) BY MOUTH TWICE DAILY @9AM -5PM 60 tablet 1   clopidogrel (PLAVIX) 75 MG tablet TAKE ONE TABLET (75 MG TOTAL) BY MOUTH DAILY AT 9AM WITH BREAKFAST APPOINTMENT OVERDUE: NEEDS APPOINTMENT FOR FURTHER REFILLS, PLEASE CALL OFFICE 30 tablet 1   Continuous Blood Gluc Sensor (FREESTYLE LIBRE 14 DAY SENSOR) MISC Apply topically 4 (four) times daily.     dapagliflozin propanediol (FARXIGA) 10 MG TABS tablet Take 1 tablet (10 mg total) by mouth daily. 30 tablet 0   escitalopram (LEXAPRO) 10 MG tablet Take 10 mg by mouth daily.     fluconazole (DIFLUCAN) 100 MG tablet Take 1 tablet by mouth once a week.     fluticasone (FLONASE) 50 MCG/ACT nasal spray Place 2 sprays into both nostrils daily.     icosapent Ethyl (VASCEPA) 1 g capsule Take 2 capsules (2 g total) by mouth 2 (two) times daily. 120 capsule 3   insulin aspart (NOVOLOG) 100 UNIT/ML injection Inject 3 Units into the skin 3 (three) times daily with meals. 10 mL 11   insulin glargine (LANTUS SOLOSTAR) 100 UNIT/ML Solostar Pen Inject 35 Units into the skin 2 (two) times daily. 15 mL 0   Insulin Pen Needle 32G X 4 MM MISC Use as directed with insulin pens 100 each 0   loratadine (CLARITIN) 10 MG tablet Take 10 mg by mouth daily.     metFORMIN (GLUCOPHAGE) 1000 MG tablet Take 1 tablet (1,000 mg total) by mouth 2 (two) times daily with a meal.     naloxone  (NARCAN) nasal spray 4 mg/0.1 mL Place 4 mg into the nose as needed (overdose).     nitroGLYCERIN (NITROSTAT) 0.4 MG SL tablet Place 1 tablet (0.4 mg total) under the tongue every 5 (five) minutes as needed. (Patient taking differently: Place 0.4 mg under the tongue every 5 (five) minutes as needed for chest pain.) 25 tablet 3   oxyCODONE (ROXICODONE) 15 MG immediate release tablet Take 1 tablet (15 mg total) by mouth every 4 (four) hours as needed for moderate pain (pain score 4-6). 30 tablet 0   pantoprazole (PROTONIX) 40 MG  tablet Take 1 tablet (40 mg total) by mouth daily. 30 tablet 0   pregabalin (LYRICA) 150 MG capsule Take 150 mg by mouth 3 (three) times daily.     promethazine (PHENERGAN) 25 MG tablet Take 25 mg by mouth every 4 (four) hours as needed for nausea or vomiting.     ranolazine (RANEXA) 500 MG 12 hr tablet Take 1 tablet (500 mg total) by mouth 2 (two) times daily. 60 tablet 1   torsemide (DEMADEX) 20 MG tablet TAKE THREE TABLETS (60 MG TOTAL) BY MOUTH DAILY AT 9AM APPOINTMENT OVERDUE: NEEDS APPOINTMENT FOR FURTHER REFILLS PLEASE CALL OFFICE 90 tablet 1   Ubrogepant (UBRELVY) 100 MG TABS Take 1 tablet by mouth daily as needed (For migraine).      Allergies: Allergies  Allergen Reactions   Sulfa Antibiotics Anaphylaxis and Other (See Comments)    Headaches     Social History: Social History   Tobacco Use   Smoking status: Former    Current packs/day: 0.00    Average packs/day: 1 pack/day for 34.0 years (34.0 ttl pk-yrs)    Types: Cigarettes    Start date: 07/12/1986    Quit date: 07/12/2020    Years since quitting: 3.3   Smokeless tobacco: Former    Types: Snuff, Chew  Vaping Use   Vaping status: Never Used  Substance Use Topics   Alcohol use: Yes    Comment: RARE   Drug use: No    Family History Family History  Problem Relation Age of Onset   Hypertension Brother    Hypertension Father    Diabetes Other    Hyperlipidemia Other     Review of  Systems 10 systems were reviewed and are negative except as noted specifically in the HPI.  Objective   Vital signs in last 24 hours: BP (!) 140/83   Pulse 85   Temp 97.8 F (36.6 C) (Oral)   Resp 16   Wt (!) 145.2 kg   SpO2 97%   BMI 37.95 kg/m   Physical Exam General: NAD, A&O, resting, appropriate HEENT: [place]/AT, EOMI, MMM Pulmonary: Normal work of breathing Cardiovascular: HDS, adequate peripheral perfusion Abdomen: Soft, NTTP, nondistended. GU: foley catheter in place draining Clear yellow urine, Mild right CVA tenderness. Bilateral testicles without masses, very mildly TTP  Extremities: warm and well perfused Neuro: Appropriate, no focal neurological deficits  Most Recent Labs: Lab Results  Component Value Date   WBC 12.7 (H) 11/26/2023   HGB 13.6 11/26/2023   HCT 42.6 11/26/2023   PLT 328 11/26/2023    Lab Results  Component Value Date   NA 135 11/26/2023   K 5.2 (H) 11/26/2023   CL 106 11/26/2023   CO2 20 (L) 11/26/2023   BUN 46 (H) 11/26/2023   CREATININE 2.13 (H) 11/26/2023   CALCIUM 8.4 (L) 11/26/2023   MG 1.7 07/04/2022   PHOS 2.4 (L) 07/04/2022    Lab Results  Component Value Date   INR 1.1 04/13/2022   APTT 36 01/06/2022     Urine Culture: @[id number] (laburin,org,[id number],[id number])@   IMAGING: CT ABDOMEN PELVIS WO CONTRAST  Result Date: 11/26/2023 CLINICAL DATA:  52 year old male with gross hematuria. Back and flank pain. EXAM: CT ABDOMEN AND PELVIS WITHOUT CONTRAST TECHNIQUE: Multidetector CT imaging of the abdomen and pelvis was performed following the standard protocol without IV contrast. RADIATION DOSE REDUCTION: This exam was performed according to the departmental dose-optimization program which includes automated exposure control, adjustment of the mA and/or kV according to patient size  and/or use of iterative reconstruction technique. COMPARISON:  Renal ultrasound 07/01/2022. FINDINGS: Lower chest: Partially visible sternotomy. Normal heart  size. No pericardial effusion. Negative lung bases, no pleural effusion. Hepatobiliary: Small layering gallstones in the gallbladder neck on series 2, image 32. Otherwise negative gallbladder. Negative noncontrast liver. Pancreas: Negative. Spleen: Negative. Adrenals/Urinary Tract: Normal adrenal glands. Renal vascular calcifications. Punctate if any superimposed nephrolithiasis (perhaps the left lower pole coronal image 109). No hydronephrosis. Mild symmetric and nonspecific pararenal space stranding. Decompressed ureters. Abnormal gas within the urinary bladder is both non dependent (series 2, image 90) and along the anterior of the bladder wall (coronal image 108 and series 2, image 93). There is underlying diffuse bladder wall thickening and perivesical inflammatory stranding. No calculus within the bladder. Pelvic phleboliths. Stomach/Bowel: Largely decompressed and negative: In the abdomen and pelvis. Decompressed terminal ileum. Normal appendix on coronal image 94 adjacent to the cecum. Fluid-filled but nondilated small bowel. Small volume retained food in the stomach. Negative duodenum. No free air or free fluid. Vascular/Lymphatic: Extensive Aortoiliac calcified atherosclerosis. Normal caliber abdominal aorta. Vascular patency is not evaluated in the absence of IV contrast. No lymphadenopathy. Reproductive: Negative. Other: Some presacral edema but no pelvic free fluid. Musculoskeletal: Advanced lumbar spine degeneration at L3-L4 with subtle anterolisthesis, vacuum disc, facet arthropathy. Partially visible sternotomy. No acute osseous abnormality identified. Generalized abdominal wall and flank subcutaneous edema (series 2, image 91). No soft tissue gas identified. IMPRESSION: 1. Positive for Emphysematous Cystitis. Regional inflammation in the pelvis. No superimposed obstructive uropathy. Punctate if any nephrolithiasis. 2. No other acute or inflammatory process identified in the noncontrast abdomen or  pelvis. Cholelithiasis without CT evidence of acute cholecystitis. Aortic Atherosclerosis (ICD10-I70.0). Electronically Signed   By: Odessa Fleming M.D.   On: 11/26/2023 08:57    ------  Assessment:  52 y.o. male with Past medical history significant for CAD, hypertension, diabetes, bilateral lower extremity amputation, DVT on anticoagulation who presents with ongoing hematuria x 3 weeks and CT scan consistent with emphysematous cystitis.  Reassuringly, the patient is overall clinically well-appearing.  He is hemodynamically stable.  His cystitis picture is very consistent with his poor glycemic control.  Emphysematous cystitis is most commonly caused by E. coli, and treatment consists of catheter placement for urinary decompression along with broad-spectrum antibiotics until susceptibilities result.  Patient will need a 14-day course for complicated UTI, and will need to keep the catheter in place until then.  Given that the patient has also had intermittent hematuria, would recommend obtaining 1-2 bladder scans daily to ensure that the patient is emptying appropriately.  In regards to his mildly tender testicles, believe that he may also have bilateral orchitis that is related to his cystitis.  Believe that antibiotics will improve this.  If patient has sudden worsening of his testicular pain, reasonable to obtain scrotal ultrasound.   Recommendations: - Recommend admission for observation for 24 to 48 hours until sensitivities result of urine culture and patient deemed hemodynamically stable - Continue foley catheter - recommend broad spectrum abx - can consider scrotal ultrasound if testicles worsen - urology will continue to follow.    Thank you for this consult. Please contact the urology consult pager with any further questions/concerns.   Roby Lofts, MD Resident Physician [facility]   I was present for the interview and exam I agree with the assessment and plan  Sherle Poe MD

## 2023-11-26 NOTE — Plan of Care (Signed)

## 2023-11-26 NOTE — Progress Notes (Signed)
Attending MD okayed patient to order an extra fruit platter with his dinner via secure chat.  Fruit platter was ordered and pt made aware

## 2023-11-26 NOTE — ED Provider Notes (Signed)
[place] [facility] AT [facility] Provider Note   CSN: [id number] Arrival date & time: 11/26/23  4098     History  Chief Complaint  Patient presents with   Hematuria    Joseph [name] is a 52 y.o. male.  HPI    52 year old male comes in with chief complaint of abdominal pain and blood in the urine.  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  Patient states that he has been having bilateral back pain, left flank region, moving across to the other side for the last 2 to 3 weeks.  More recently is started having some lower quadrant abdominal pain.  He is also noted darkening of his urine followed by today blood in the urine.  Patient also feels that when he voids sometimes that there are bubbles passing.  Patient does not have any history of recurrent UTIs.  Review of system is negative for fevers, chills, nausea, vomiting.  Home Medications Prior to Admission medications   Medication Sig Start Date End Date Taking? Authorizing Provider  acetaminophen (TYLENOL) 500 MG tablet Take 1,000 mg by mouth 3 (three) times daily as needed for headache.    [provider]  albuterol (VENTOLIN HFA) 108 (90 Base) MCG/ACT inhaler Inhale 2 puffs into the lungs every 6 (six) hours as needed for shortness of breath.    [provider]  ALPRAZolam Prudy Feeler) 1 MG tablet Take 2 mg by mouth at bedtime.    [provider]  aspirin EC 81 MG tablet Take 81 mg by mouth in the morning. Swallow whole.    [provider]  atorvastatin (LIPITOR) 80 MG tablet TAKE ONE TABLET (80 MG TOTAL) BY MOUTH DAILY AT 6PM APPOINTMENT OVERDUE: NEEDS APPOINTMENT FOR FURTHER REFILLS PLEASE CALL OFFICE 11/25/23   Tereso Newcomer T, PA-C  buPROPion (WELLBUTRIN XL) 150 MG 24 hr tablet Take 150 mg by mouth in the morning and at bedtime.    [provider]  carvedilol (COREG) 6.25 MG tablet TAKE ONE TABLET (6.25 MG TOTAL) BY MOUTH TWICE DAILY @9AM -5PM 11/25/23   Tereso Newcomer T, PA-C  clopidogrel (PLAVIX) 75 MG tablet TAKE ONE TABLET (75 MG TOTAL) BY MOUTH DAILY AT 9AM WITH BREAKFAST APPOINTMENT OVERDUE: NEEDS APPOINTMENT FOR FURTHER REFILLS, PLEASE CALL OFFICE 11/25/23   Tereso Newcomer T, PA-C  Continuous Blood Gluc Sensor (FREESTYLE LIBRE 14 DAY SENSOR) MISC Apply topically 4 (four) times daily. 09/23/22   [provider]  dapagliflozin propanediol (FARXIGA) 10 MG TABS tablet Take 1 tablet (10 mg total) by mouth daily. 10/27/23   Tereso Newcomer T, PA-C  escitalopram (LEXAPRO) 10 MG tablet Take 10 mg by mouth daily. 01/27/22   [provider]  fluconazole (DIFLUCAN) 100 MG tablet Take 1 tablet by mouth once a week.    [provider]  fluticasone (FLONASE) 50 MCG/ACT nasal spray Place 2 sprays into both nostrils daily.    [provider]  icosapent Ethyl (VASCEPA) 1 g capsule Take 2 capsules (2 g total) by mouth 2 (two) times daily. 10/27/23   Tereso Newcomer T, PA-C  insulin aspart (NOVOLOG) 100 UNIT/ML injection Inject 3 Units into the skin 3 (three) times daily with meals. 07/13/22   Dahal, Melina Schools, MD  insulin glargine (LANTUS SOLOSTAR) 100 UNIT/ML Solostar Pen Inject 35 Units into the skin 2 (two) times daily. 05/03/23   Dameron, Nolberto Hanlon, DO  Insulin Pen Needle 32G  X 4 MM MISC Use as directed with insulin pens 05/03/23   Dameron, Nolberto Hanlon, DO  loratadine (CLARITIN) 10 MG tablet Take 10 mg by mouth daily. 09/27/22   [provider]  metFORMIN (GLUCOPHAGE) 1000 MG tablet Take 1 tablet (1,000 mg total) by mouth 2 (two) times daily with a meal. 11/05/19   Leone Brand, NP  naloxone [facility]) nasal spray 4 mg/0.1 mL Place 4 mg into the nose as needed (overdose).    [provider]  nitroGLYCERIN (NITROSTAT) 0.4 MG SL tablet Place 1 tablet (0.4 mg total) under the tongue every 5 (five) minutes as  needed. Patient taking differently: Place 0.4 mg under the tongue every 5 (five) minutes as needed for chest pain. 10/08/22   Gaston Islam., NP  oxyCODONE (ROXICODONE) 15 MG immediate release tablet Take 1 tablet (15 mg total) by mouth every 4 (four) hours as needed for moderate pain (pain score 4-6). 07/13/22   Lorin Glass, MD  pantoprazole (PROTONIX) 40 MG tablet Take 1 tablet (40 mg total) by mouth daily. 09/23/17   Hongalgi, Maximino Greenland, MD  pregabalin (LYRICA) 150 MG capsule Take 150 mg by mouth 3 (three) times daily. 09/10/22   [provider]  promethazine (PHENERGAN) 25 MG tablet Take 25 mg by mouth every 4 (four) hours as needed for nausea or vomiting. 09/10/22   [provider]  ranolazine (RANEXA) 500 MG 12 hr tablet Take 1 tablet (500 mg total) by mouth 2 (two) times daily. 10/27/23   Tereso Newcomer T, PA-C  torsemide (DEMADEX) 20 MG tablet TAKE THREE TABLETS (60 MG TOTAL) BY MOUTH DAILY AT 9AM APPOINTMENT OVERDUE: NEEDS APPOINTMENT FOR FURTHER REFILLS PLEASE CALL OFFICE 11/25/23   Tereso Newcomer T, PA-C  Ubrogepant (UBRELVY) 100 MG TABS Take 1 tablet by mouth daily as needed (For migraine). 06/30/22   [provider]      Allergies    Sulfa antibiotics    Review of Systems   Review of Systems  All other systems reviewed and are negative.   Physical Exam Updated Vital Signs BP 127/86 (BP Location: Right Arm)   Pulse 85   Temp 97.8 F (36.6 C) (Oral)   Resp 17   Wt (!) 145.2 kg   SpO2 98%   BMI 37.95 kg/m  Physical Exam Vitals and nursing note reviewed.  Constitutional:      Appearance: He is well-developed.  HENT:     Head: Atraumatic.  Cardiovascular:     Rate and Rhythm: Normal rate.  Pulmonary:     Effort: Pulmonary effort is normal.  Abdominal:     Tenderness: There is abdominal tenderness.     Comments: Patient has lower quadrant tenderness, left flank tenderness  Musculoskeletal:     Cervical back: Neck supple.  Skin:    General:  Skin is warm.  Neurological:     Mental Status: He is alert and oriented to person, place, and time.     ED Results / Procedures / Treatments   Labs (all labs ordered are listed, but only abnormal results are displayed) Labs Reviewed  URINALYSIS, ROUTINE W REFLEX MICROSCOPIC - Abnormal; Notable for the following components:      Result Value   APPearance CLOUDY (*)    Glucose, UA >=500 (*)    Hgb urine dipstick LARGE (*)    Protein, ur >=300 (*)    Nitrite POSITIVE (*)    Leukocytes,Ua MODERATE (*)    Bacteria, UA MANY (*)  All other components within normal limits  CBC WITH DIFFERENTIAL/PLATELET - Abnormal; Notable for the following components:   WBC 12.7 (*)    RDW 15.7 (*)    Neutro Abs 8.1 (*)    Monocytes Absolute 1.2 (*)    Eosinophils Absolute 0.9 (*)    All other components within normal limits  COMPREHENSIVE METABOLIC PANEL - Abnormal; Notable for the following components:   Potassium 5.2 (*)    CO2 20 (*)    Glucose, Bld 221 (*)    BUN 46 (*)    Creatinine, Ser 2.13 (*)    Calcium 8.4 (*)    Albumin 3.1 (*)    GFR, Estimated 37 (*)    All other components within normal limits  URINE CULTURE    EKG None  Radiology CT ABDOMEN PELVIS WO CONTRAST  Result Date: 11/26/2023 CLINICAL DATA:  52 year old male with gross hematuria. Back and flank pain. EXAM: CT ABDOMEN AND PELVIS WITHOUT CONTRAST TECHNIQUE: Multidetector CT imaging of the abdomen and pelvis was performed following the standard protocol without IV contrast. RADIATION DOSE REDUCTION: This exam was performed according to the departmental dose-optimization program which includes automated exposure control, adjustment of the mA and/or kV according to patient size and/or use of iterative reconstruction technique. COMPARISON:  Renal ultrasound 07/01/2022. FINDINGS: Lower chest: Partially visible sternotomy. Normal heart size. No pericardial effusion. Negative lung bases, no pleural effusion. Hepatobiliary:  Small layering gallstones in the gallbladder neck on series 2, image 32. Otherwise negative gallbladder. Negative noncontrast liver. Pancreas: Negative. Spleen: Negative. Adrenals/Urinary Tract: Normal adrenal glands. Renal vascular calcifications. Punctate if any superimposed nephrolithiasis (perhaps the left lower pole coronal image 109). No hydronephrosis. Mild symmetric and nonspecific pararenal space stranding. Decompressed ureters. Abnormal gas within the urinary bladder is both non dependent (series 2, image 90) and along the anterior of the bladder wall (coronal image 108 and series 2, image 93). There is underlying diffuse bladder wall thickening and perivesical inflammatory stranding. No calculus within the bladder. Pelvic phleboliths. Stomach/Bowel: Largely decompressed and negative: In the abdomen and pelvis. Decompressed terminal ileum. Normal appendix on coronal image 94 adjacent to the cecum. Fluid-filled but nondilated small bowel. Small volume retained food in the stomach. Negative duodenum. No free air or free fluid. Vascular/Lymphatic: Extensive Aortoiliac calcified atherosclerosis. Normal caliber abdominal aorta. Vascular patency is not evaluated in the absence of IV contrast. No lymphadenopathy. Reproductive: Negative. Other: Some presacral edema but no pelvic free fluid. Musculoskeletal: Advanced lumbar spine degeneration at L3-L4 with subtle anterolisthesis, vacuum disc, facet arthropathy. Partially visible sternotomy. No acute osseous abnormality identified. Generalized abdominal wall and flank subcutaneous edema (series 2, image 91). No soft tissue gas identified. IMPRESSION: 1. Positive for Emphysematous Cystitis. Regional inflammation in the pelvis. No superimposed obstructive uropathy. Punctate if any nephrolithiasis. 2. No other acute or inflammatory process identified in the noncontrast abdomen or pelvis. Cholelithiasis without CT evidence of acute cholecystitis. Aortic Atherosclerosis  (ICD10-I70.0). Electronically Signed   By: Odessa Fleming M.D.   On: 11/26/2023 08:57    Procedures .Critical Care  Performed by: Derwood Kaplan, MD Authorized by: Derwood Kaplan, MD   Critical care provider statement:    Critical care time (minutes):  36   Critical care was necessary to treat or prevent imminent or life-threatening deterioration of the following conditions: Gas-forming infection.   Critical care was time spent personally by me on the following activities:  Development of treatment plan with patient or surrogate, discussions with consultants, evaluation of patient's response to  treatment, examination of patient, ordering and review of laboratory studies, ordering and review of radiographic studies, ordering and performing treatments and interventions, pulse oximetry, re-evaluation of patient's condition and review of old charts     Medications Ordered in ED Medications  cefTRIAXone (ROCEPHIN) 2 g in sodium chloride 0.9 % 100 mL IVPB (0 g Intravenous Stopped 11/26/23 0948)  oxyCODONE (Oxy IR/ROXICODONE) immediate release tablet 10 mg (10 mg Oral Given 11/26/23 0847)    ED Course/ Medical Decision Making/ A&P                                 Medical Decision Making Amount and/or Complexity of Data Reviewed Labs: ordered. Radiology: ordered.  Risk Prescription drug management. Decision regarding hospitalization.   This patient presents to the ED with chief complaint(s) of abdominal pain, blood in the urine with pertinent past medical history of CAD, diabetes with subsequent complications requiring amputation.patient has previous history of DVT, currently not on any anticoagulation but he does take Plavix. The complaint involves an extensive differential diagnosis and also carries with it a high risk of complications and morbidity.    The differential diagnosis includes : Acute hemorrhagic cystitis, bladder cancer, renal mass, pyelonephritis, renal stones.   The initial  plan is to get basic labs, UA with culture and also CT abdomen pelvis without contrast  Additional history obtained: Additional history obtained from spouse Records reviewed previous admission documents and Primary Care Documents  Independent labs interpretation:  The following labs were independently interpreted: Patient has mild leukocytosis.  Creatinine is at baseline normal for the patient.  Independent visualization and interpretation of imaging: - I independently visualized the following imaging with scope of interpretation limited to determining acute life threatening conditions related to emergency care: CT scan of the abdomen and pelvis, which revealed no evidence of hydronephrosis.  However, there is evidence of emphysematous cystitis.  Patient's UA is nitrite positive with pyuria and bacteriuria.  Consultation: - Consulted or discussed management/test interpretation with external professional: Case discussed with Dr. Ilsa Iha, infectious disease.  She recommends observation type stay for the patient.  Also spoke with Dr. Jennette Bill, urology -from their perspective patient can get a Foley catheter placed and antibiotics for 14-day duration and follow-up with urology after the antibiotic course has been completed.  Treatment and Reassessment: This recommendation has been discussed with the patient.  We will insert Foley catheter now.  Clinically patient is not septic.  Final Clinical Impression(s) / ED Diagnoses Final diagnoses:  Emphysematous cystitis    Rx / DC Orders ED Discharge Orders     None         Derwood Kaplan, MD 11/26/23 1220

## 2023-11-26 NOTE — ED Notes (Signed)
ED TO INPATIENT HANDOFF REPORT  ED Nurse Name and Phone #: Thamas Jaegers Name/Age/Gender Joseph [name] 52 y.o. male Room/Bed: WA20/WA20  Code Status   Code Status: Full Code  Home/SNF/Other Home Patient oriented to: self, place, time, and situation Is this baseline? Yes   Triage Complete: Triage complete  Chief Complaint Emphysematous cystitis [N30.80]  Triage Note Pt reports with hematuria since this morning. Pt states that he has had back and flank pain bilaterally x 3 weeks.    Allergies Allergies  Allergen Reactions   Sulfa Antibiotics Anaphylaxis and Other (See Comments)    Headaches     Level of Care/Admitting Diagnosis ED Disposition     ED Disposition  Admit   Condition  --   Comment  Hospital Area: [facility] [[id number]]  Level of Care: Med-Surg [16]  May place patient in observation at [facility] or [facility] if equivalent level of care is available:: No  Covid Evaluation: Asymptomatic - no recent exposure (last 10 days) testing not required  Diagnosis: Emphysematous cystitis [409811]  Admitting Physician: Steffanie Rainwater [[id number]]  Attending Physician: Steffanie Rainwater [[id number]]          B Medical/Surgery History Past Medical History:  Diagnosis Date   Aneurysm of ascending aorta (HCC) 09/14/2021   Anxiety    Arthritis    "knees, shoulders, hips, ankles" (07/29/2016)   Asthma    Bicuspid aortic valve 09/14/2021   CAD (coronary artery disease)    a. 2017: s/p BMS to distal Cx; b. LHC 05/30/2019: 80% mid, distal RCA s/p DES, 30% narrowing of d LM, widely patent LAD w/ luminal irregularities, widely patent stent in dCX  w/ 90+% stenosis distal to stent beofre small trifurcating obtuse marginal (potentially area of restenosis)   Chronic bronchitis (HCC)    Chronic ulcer of great toe of right foot (HCC) 09/09/2017   Chronic ulcer of right great toe (HCC) 09/19/2017   COPD (chronic obstructive pulmonary disease) (HCC)     Coronary arteriosclerosis    Depression    Diabetes mellitus (HCC)    DVT (deep venous thrombosis) (HCC)    GERD (gastroesophageal reflux disease)    Hyperlipidemia    Hypertension    Migraine    Morbid obesity (HCC)    Myocardial infarction (HCC) 1996   "light one"   PE (pulmonary embolism) 04/2013   On chronic Xarelto   Peripheral nerve disease    Pneumonia "several times"   Sleep apnea    Type II diabetes mellitus (HCC)    Past Surgical History:  Procedure Laterality Date   ABDOMINAL AORTOGRAM W/LOWER EXTREMITY N/A 10/10/2020   Procedure: ABDOMINAL AORTOGRAM W/LOWER EXTREMITY;  Surgeon: Sherren Kerns, MD;  [facility];  Service: Cardiovascular;  Laterality: N/A;   AMPUTATION Right 09/21/2017   Procedure: RIGHT GREAT TOE AMPUTATION, POSSIBLE VAC;  Surgeon: Tarry Kos, MD;  Location: [facility] OR;  Service: Orthopedics;  Laterality: Right;   AMPUTATION Left 08/13/2020   Procedure: LEFT FOOT 4TH RAY AMPUTATION;  Surgeon: [name], MD;  Location: [facility] OR;  Service: Orthopedics;  Laterality: Left;   AMPUTATION Left 11/20/2021   Procedure: LEFT TRANSMETATARSAL AMPUTATION;  Surgeon: [name], MD;  Location: [facility] OR;  Service: Orthopedics;  Laterality: Left;   AMPUTATION Left 01/08/2022   Procedure: LEFT BELOW KNEE AMPUTATION;  Surgeon: [name], MD;  Location: [facility] OR;  Service: Orthopedics;  Laterality: Left;   AMPUTATION Right 07/07/2022  Procedure: RIGHT BELOW KNEE AMPUTATION;  Surgeon: [name], MD;  Location: [facility] OR;  Service: Orthopedics;  Laterality: Right;   AORTOGRAM Bilateral 03/13/2021   Procedure: ABDOMINAL AORTOGRAM WITH Left LOWER EXTREMITY RUNOFF;  Surgeon: Leonie Douglas, MD;  Location: [facility] OR;  Service: Vascular;  Laterality: Bilateral;   APPLICATION OF WOUND VAC  01/08/2022   Procedure: APPLICATION OF WOUND VAC;  Surgeon: [name], MD;  Location: [facility] OR;  Service: Orthopedics;;   APPLICATION OF WOUND VAC Right 07/07/2022   Procedure:  APPLICATION OF WOUND VAC;  Surgeon: [name], MD;  [facility]: [facility] OR;  Service: Orthopedics;  Laterality: Right;   CARDIAC CATHETERIZATION  2006   CARDIAC CATHETERIZATION  1996   "@ [facility]; when I had my heart attack"   CARDIAC CATHETERIZATION N/A 07/29/2016   Procedure: Left Heart Cath and Coronary Angiography;  Surgeon: Runell Gess, MD;  Location: [facility] INVASIVE CV LAB;  Service: Cardiovascular;  Laterality: N/A;   CARDIAC CATHETERIZATION N/A 07/29/2016   Procedure: Coronary Stent Intervention;  Surgeon: Runell Gess, MD;  [facility]: [facility];  Service: Cardiovascular;  Laterality: N/A;   CARPAL TUNNEL RELEASE Bilateral    CORONARY ANGIOPLASTY WITH STENT PLACEMENT  07/29/2016   CORONARY ARTERY BYPASS GRAFT N/A 07/24/2020   Procedure: CORONARY ARTERY BYPASS GRAFTING (CABG), ON PUMP, TIMES FOUR, USING LEFT INTERNAL MAMMARY ARTERY AND ENDOSCOPICALLY HARVESTED RIGHT GREATER SAPHENOUS VEIN;  Surgeon: Corliss Skains, MD;  Location: [facility] OR;  Service: Open Heart Surgery;  Laterality: N/A;  FLOW TAC   CORONARY PRESSURE/FFR STUDY N/A 07/22/2020   Procedure: INTRAVASCULAR PRESSURE WIRE/FFR STUDY;  Surgeon: Marykay Lex, MD;  Location: [facility] INVASIVE CV LAB;  Service: Cardiovascular;  Laterality: N/A;   CORONARY STENT INTERVENTION N/A 05/30/2019   Procedure: CORONARY STENT INTERVENTION;  Surgeon: Lyn Records, MD;  [facility];  Service: Cardiovascular;  Laterality: N/A;   CORONARY STENT INTERVENTION N/A 11/02/2019   Procedure: CORONARY STENT INTERVENTION;  Surgeon: Iran Ouch, MD;  [facility];  Service: Cardiovascular;  Laterality: N/A;   ESOPHAGOGASTRODUODENOSCOPY N/A 09/22/2017   Procedure: ESOPHAGOGASTRODUODENOSCOPY (EGD);  Surgeon: Meryl Dare, MD;  Location: [facility] ENDOSCOPY;  Service: Endoscopy;  Laterality: N/A;   KNEE ARTHROSCOPY Bilateral    "2 on left; 1 on the right"   LEFT HEART CATH AND CORONARY ANGIOGRAPHY N/A 11/02/2019    Procedure: LEFT HEART CATH AND CORONARY ANGIOGRAPHY;  Surgeon: Iran Ouch, MD;  [facility];  Service: Cardiovascular;  Laterality: N/A;   LEFT HEART CATH AND CORONARY ANGIOGRAPHY N/A 07/22/2020   Procedure: LEFT HEART CATH AND CORONARY ANGIOGRAPHY;  Surgeon: Marykay Lex, MD;  Location: [facility] INVASIVE CV LAB;  Service: Cardiovascular;  Laterality: N/A;   LEFT HEART CATH AND CORS/GRAFTS ANGIOGRAPHY N/A 08/17/2021   Procedure: LEFT HEART CATH AND CORS/GRAFTS ANGIOGRAPHY;  Surgeon: Yvonne Kendall, MD;  [facility];  Service: Cardiovascular;  Laterality: N/A;   PERIPHERAL VASCULAR INTERVENTION Left 10/11/2020   popliteal and SFA stent placement    PERIPHERAL VASCULAR INTERVENTION Left 10/10/2020   Procedure: PERIPHERAL VASCULAR INTERVENTION;  Surgeon: Sherren Kerns, MD;  [facility];  Service: Cardiovascular;  Laterality: Left;   RIGHT/LEFT HEART CATH AND CORONARY ANGIOGRAPHY N/A 05/30/2019   Procedure: RIGHT/LEFT HEART CATH AND CORONARY ANGIOGRAPHY;  Surgeon: Lyn Records, MD;  [facility];  Service: Cardiovascular;  Laterality: N/A;   RIGHT/LEFT HEART CATH  AND CORONARY/GRAFT ANGIOGRAPHY N/A 06/17/2022   Procedure: RIGHT/LEFT HEART CATH AND CORONARY/GRAFT ANGIOGRAPHY;  Surgeon: Lennette Bihari, MD;  [facility];  Service: Cardiovascular;  Laterality: N/A;   SHOULDER OPEN ROTATOR CUFF REPAIR Bilateral    TEE WITHOUT CARDIOVERSION N/A 07/24/2020   Procedure: TRANSESOPHAGEAL ECHOCARDIOGRAM (TEE);  Surgeon: Corliss Skains, MD;  Location: [facility] OR;  Service: Open Heart Surgery;  Laterality: N/A;     A IV Location/Drains/Wounds Patient Lines/Drains/Airways Status     Active Line/Drains/Airways     Name Placement date Placement time Site Days   Peripheral IV 11/26/23 22 G 1" Anterior;Left;Distal Forearm 11/26/23  0908  Forearm  less than 1   Urethral Catheter Synetta Fail, RN Straight-tip 16 Fr.  11/26/23  1205  Straight-tip  less than 1            Intake/Output Last 24 hours  Intake/Output Summary (Last 24 hours) at 11/26/2023 1417 Last data filed at 11/26/2023 0948 Gross per 24 hour  Intake 100 ml  Output --  Net 100 ml    Labs/Imaging Results for orders placed or performed during the hospital encounter of 11/26/23 (from the past 48 hour(s))  Urinalysis, Routine w reflex microscopic -Urine, Clean Catch     Status: Abnormal   Collection Time: 11/26/23  6:44 AM  Result Value Ref Range   Color, Urine YELLOW YELLOW   APPearance CLOUDY (A) CLEAR   Specific Gravity, Urine 1.016 1.005 - 1.030   pH 5.0 5.0 - 8.0   Glucose, UA >=500 (A) NEGATIVE mg/dL   Hgb urine dipstick LARGE (A) NEGATIVE   Bilirubin Urine NEGATIVE NEGATIVE   Ketones, ur NEGATIVE NEGATIVE mg/dL   Protein, ur >=119 (A) NEGATIVE mg/dL   Nitrite POSITIVE (A) NEGATIVE   Leukocytes,Ua MODERATE (A) NEGATIVE   RBC / HPF >50 0 - 5 RBC/hpf   WBC, UA >50 0 - 5 WBC/hpf   Bacteria, UA MANY (A) NONE SEEN   Squamous Epithelial / HPF 0-5 0 - 5 /HPF   WBC Clumps PRESENT     Comment: Performed at [facility], [address], [place], [name] [address]  CBC with Differential     Status: Abnormal   Collection Time: 11/26/23  6:44 AM  Result Value Ref Range   WBC 12.7 (H) 4.0 - 10.5 K/uL   RBC 4.91 4.22 - 5.81 MIL/uL   Hemoglobin 13.6 13.0 - 17.0 g/dL   HCT 95.6 21.3 - 08.6 %   MCV 86.8 80.0 - 100.0 fL   MCH 27.7 26.0 - 34.0 pg   MCHC 31.9 30.0 - 36.0 g/dL   RDW 57.8 (H) 46.9 - 62.9 %   Platelets 328 150 - 400 K/uL   nRBC 0.0 0.0 - 0.2 %   Neutrophils Relative % 63 %   Neutro Abs 8.1 (H) 1.7 - 7.7 K/uL   Lymphocytes Relative 19 %   Lymphs Abs 2.4 0.7 - 4.0 K/uL   Monocytes Relative 9 %   Monocytes Absolute 1.2 (H) 0.1 - 1.0 K/uL   Eosinophils Relative 7 %   Eosinophils Absolute 0.9 (H) 0.0 - 0.5 K/uL   Basophils Relative 1 %   Basophils Absolute 0.1 0.0 - 0.1 K/uL   Immature  Granulocytes 1 %   Abs Immature Granulocytes 0.06 0.00 - 0.07 K/uL    Comment: Performed at [facility], [address], [place], [name] [address]  Comprehensive metabolic panel     Status: Abnormal  Collection Time: 11/26/23  6:44 AM  Result Value Ref Range   Sodium 135 135 - 145 mmol/L   Potassium 5.2 (H) 3.5 - 5.1 mmol/L   Chloride 106 98 - 111 mmol/L   CO2 20 (L) 22 - 32 mmol/L   Glucose, Bld 221 (H) 70 - 99 mg/dL    Comment: Glucose reference range applies only to samples taken after fasting for at least 8 hours.   BUN 46 (H) 6 - 20 mg/dL   Creatinine, Ser 9.14 (H) 0.61 - 1.24 mg/dL   Calcium 8.4 (L) 8.9 - 10.3 mg/dL   Total Protein 6.6 6.5 - 8.1 g/dL   Albumin 3.1 (L) 3.5 - 5.0 g/dL   AST 18 15 - 41 U/L   ALT 18 0 - 44 U/L   Alkaline Phosphatase 122 38 - 126 U/L   Total Bilirubin 0.4 <1.2 mg/dL   GFR, Estimated 37 (L) >60 mL/min    Comment: (NOTE) Calculated using the CKD-EPI Creatinine Equation (2021)    Anion gap 9 5 - 15    Comment: Performed at [facility], [address], [place], [name] [address]   CT ABDOMEN PELVIS WO CONTRAST  Result Date: 11/26/2023 CLINICAL DATA:  52 year old male with gross hematuria. Back and flank pain. EXAM: CT ABDOMEN AND PELVIS WITHOUT CONTRAST TECHNIQUE: Multidetector CT imaging of the abdomen and pelvis was performed following the standard protocol without IV contrast. RADIATION DOSE REDUCTION: This exam was performed according to the departmental dose-optimization program which includes automated exposure control, adjustment of the mA and/or kV according to patient size and/or use of iterative reconstruction technique. COMPARISON:  Renal ultrasound 07/01/2022. FINDINGS: Lower chest: Partially visible sternotomy. Normal heart size. No pericardial effusion. Negative lung bases, no pleural effusion. Hepatobiliary: Small layering gallstones in the gallbladder neck on series 2, image 32. Otherwise negative  gallbladder. Negative noncontrast liver. Pancreas: Negative. Spleen: Negative. Adrenals/Urinary Tract: Normal adrenal glands. Renal vascular calcifications. Punctate if any superimposed nephrolithiasis (perhaps the left lower pole coronal image 109). No hydronephrosis. Mild symmetric and nonspecific pararenal space stranding. Decompressed ureters. Abnormal gas within the urinary bladder is both non dependent (series 2, image 90) and along the anterior of the bladder wall (coronal image 108 and series 2, image 93). There is underlying diffuse bladder wall thickening and perivesical inflammatory stranding. No calculus within the bladder. Pelvic phleboliths. Stomach/Bowel: Largely decompressed and negative: In the abdomen and pelvis. Decompressed terminal ileum. Normal appendix on coronal image 94 adjacent to the cecum. Fluid-filled but nondilated small bowel. Small volume retained food in the stomach. Negative duodenum. No free air or free fluid. Vascular/Lymphatic: Extensive Aortoiliac calcified atherosclerosis. Normal caliber abdominal aorta. Vascular patency is not evaluated in the absence of IV contrast. No lymphadenopathy. Reproductive: Negative. Other: Some presacral edema but no pelvic free fluid. Musculoskeletal: Advanced lumbar spine degeneration at L3-L4 with subtle anterolisthesis, vacuum disc, facet arthropathy. Partially visible sternotomy. No acute osseous abnormality identified. Generalized abdominal wall and flank subcutaneous edema (series 2, image 91). No soft tissue gas identified. IMPRESSION: 1. Positive for Emphysematous Cystitis. Regional inflammation in the pelvis. No superimposed obstructive uropathy. Punctate if any nephrolithiasis. 2. No other acute or inflammatory process identified in the noncontrast abdomen or pelvis. Cholelithiasis without CT evidence of acute cholecystitis. Aortic Atherosclerosis (ICD10-I70.0). Electronically Signed   By: Odessa Fleming M.D.   On: 11/26/2023 08:57    Pending  Labs [employer] (From admission, onward)     Start     Ordered  11/27/23 0500  Hemoglobin A1c  Tomorrow morning,   R       Comments: To assess prior glycemic control    11/26/23 1324   11/26/23 1322  HIV Antibody (routine testing w rflx)  (HIV Antibody (Routine testing w reflex) panel)  Once,   R        11/26/23 1323   11/26/23 0653  Urine Culture  Once,   URGENT       Question:  Indication  Answer:  Acute gross hematuria   11/26/23 0652            Vitals/Pain Today's Vitals   11/26/23 0945 11/26/23 1000 11/26/23 1045 11/26/23 1210  BP: (!) 136/93 (!) 125/91 127/86 (!) 140/83  Pulse: 86 84 85 85  Resp:  18 17 16   Temp:  97.8 F (36.6 C)    TempSrc:  Oral    SpO2: 97% 98% 98% 97%  Weight:      PainSc:  7       Isolation Precautions No active isolations  Medications Medications  enoxaparin (LOVENOX) injection 70 mg (has no administration in time range)  acetaminophen (TYLENOL) tablet 650 mg (has no administration in time range)    Or  acetaminophen (TYLENOL) suppository 650 mg (has no administration in time range)  senna-docusate (Senokot-S) tablet 1 tablet (has no administration in time range)  ondansetron (ZOFRAN) tablet 4 mg (has no administration in time range)    Or  ondansetron (ZOFRAN) injection 4 mg (has no administration in time range)  insulin aspart (novoLOG) injection 0-15 Units (has no administration in time range)  insulin aspart (novoLOG) injection 0-5 Units (has no administration in time range)  insulin aspart (novoLOG) injection 3 Units (has no administration in time range)  insulin glargine-yfgn (SEMGLEE) injection 35 Units (has no administration in time range)  oxyCODONE (Oxy IR/ROXICODONE) immediate release tablet 15 mg (has no administration in time range)  HYDROmorphone (DILAUDID) injection 0.5 mg (has no administration in time range)  cefTRIAXone (ROCEPHIN) 2 g in sodium chloride 0.9 % 100 mL IVPB (0 g Intravenous Stopped 11/26/23 0948)   oxyCODONE (Oxy IR/ROXICODONE) immediate release tablet 10 mg (10 mg Oral Given 11/26/23 0847)    Mobility Bilateral BKA, uses prosthetics     Focused Assessments     R Recommendations: See Admitting Provider Note  Report given to:   Additional Notes:

## 2023-11-27 DIAGNOSIS — I5032 Chronic diastolic (congestive) heart failure: Secondary | ICD-10-CM | POA: Diagnosis present

## 2023-11-27 DIAGNOSIS — F32A Depression, unspecified: Secondary | ICD-10-CM | POA: Diagnosis present

## 2023-11-27 DIAGNOSIS — Z1611 Resistance to penicillins: Secondary | ICD-10-CM | POA: Diagnosis present

## 2023-11-27 DIAGNOSIS — I700 Atherosclerosis of aorta: Secondary | ICD-10-CM | POA: Diagnosis present

## 2023-11-27 DIAGNOSIS — Z89511 Acquired absence of right leg below knee: Secondary | ICD-10-CM | POA: Diagnosis not present

## 2023-11-27 DIAGNOSIS — E1142 Type 2 diabetes mellitus with diabetic polyneuropathy: Secondary | ICD-10-CM | POA: Diagnosis present

## 2023-11-27 DIAGNOSIS — Q2381 Bicuspid aortic valve: Secondary | ICD-10-CM | POA: Diagnosis not present

## 2023-11-27 DIAGNOSIS — N3080 Other cystitis without hematuria: Secondary | ICD-10-CM | POA: Diagnosis present

## 2023-11-27 DIAGNOSIS — I130 Hypertensive heart and chronic kidney disease with heart failure and stage 1 through stage 4 chronic kidney disease, or unspecified chronic kidney disease: Secondary | ICD-10-CM | POA: Diagnosis present

## 2023-11-27 DIAGNOSIS — E1122 Type 2 diabetes mellitus with diabetic chronic kidney disease: Secondary | ICD-10-CM | POA: Diagnosis present

## 2023-11-27 DIAGNOSIS — G928 Other toxic encephalopathy: Secondary | ICD-10-CM | POA: Diagnosis present

## 2023-11-27 DIAGNOSIS — E119 Type 2 diabetes mellitus without complications: Secondary | ICD-10-CM | POA: Diagnosis not present

## 2023-11-27 DIAGNOSIS — B961 Klebsiella pneumoniae [K. pneumoniae] as the cause of diseases classified elsewhere: Secondary | ICD-10-CM | POA: Diagnosis present

## 2023-11-27 DIAGNOSIS — F419 Anxiety disorder, unspecified: Secondary | ICD-10-CM | POA: Diagnosis present

## 2023-11-27 DIAGNOSIS — E1165 Type 2 diabetes mellitus with hyperglycemia: Secondary | ICD-10-CM | POA: Diagnosis not present

## 2023-11-27 DIAGNOSIS — N3081 Other cystitis with hematuria: Secondary | ICD-10-CM | POA: Diagnosis present

## 2023-11-27 DIAGNOSIS — E785 Hyperlipidemia, unspecified: Secondary | ICD-10-CM | POA: Diagnosis present

## 2023-11-27 DIAGNOSIS — N1831 Chronic kidney disease, stage 3a: Secondary | ICD-10-CM | POA: Diagnosis present

## 2023-11-27 DIAGNOSIS — E1151 Type 2 diabetes mellitus with diabetic peripheral angiopathy without gangrene: Secondary | ICD-10-CM | POA: Diagnosis present

## 2023-11-27 DIAGNOSIS — E875 Hyperkalemia: Secondary | ICD-10-CM | POA: Diagnosis present

## 2023-11-27 DIAGNOSIS — Z6841 Body Mass Index (BMI) 40.0 and over, adult: Secondary | ICD-10-CM | POA: Diagnosis not present

## 2023-11-27 DIAGNOSIS — Z89512 Acquired absence of left leg below knee: Secondary | ICD-10-CM | POA: Diagnosis not present

## 2023-11-27 DIAGNOSIS — Z794 Long term (current) use of insulin: Secondary | ICD-10-CM | POA: Diagnosis not present

## 2023-11-27 DIAGNOSIS — N179 Acute kidney failure, unspecified: Secondary | ICD-10-CM | POA: Diagnosis present

## 2023-11-27 DIAGNOSIS — T50915A Adverse effect of multiple unspecified drugs, medicaments and biological substances, initial encounter: Secondary | ICD-10-CM | POA: Diagnosis present

## 2023-11-27 LAB — BASIC METABOLIC PANEL
Anion gap: 8 (ref 5–15)
BUN: 37 mg/dL — ABNORMAL HIGH (ref 6–20)
CO2: 26 mmol/L (ref 22–32)
Calcium: 8.5 mg/dL — ABNORMAL LOW (ref 8.9–10.3)
Chloride: 104 mmol/L (ref 98–111)
Creatinine, Ser: 1.81 mg/dL — ABNORMAL HIGH (ref 0.61–1.24)
GFR, Estimated: 45 mL/min — ABNORMAL LOW (ref >60)
Glucose, Bld: 121 mg/dL — ABNORMAL HIGH (ref 70–99)
Potassium: 5.7 mmol/L — ABNORMAL HIGH (ref 3.5–5.1)
Sodium: 138 mmol/L (ref 135–145)

## 2023-11-27 LAB — HEMOGLOBIN A1C
Hgb A1c MFr Bld: 8 % — ABNORMAL HIGH (ref 4.8–5.6)
Mean Plasma Glucose: 182.9 mg/dL

## 2023-11-27 LAB — GLUCOSE, CAPILLARY
Glucose-Capillary: 106 mg/dL — ABNORMAL HIGH (ref 70–99)
Glucose-Capillary: 114 mg/dL — ABNORMAL HIGH (ref 70–99)
Glucose-Capillary: 88 mg/dL (ref 70–99)
Glucose-Capillary: 167 mg/dL — ABNORMAL HIGH (ref 70–99)

## 2023-11-27 LAB — CBC
HCT: 43.3 % (ref 39.0–52.0)
Hemoglobin: 13.1 g/dL (ref 13.0–17.0)
MCH: 26.6 pg (ref 26.0–34.0)
MCHC: 30.3 g/dL (ref 30.0–36.0)
MCV: 87.8 fL (ref 80.0–100.0)
Platelets: 299 10*3/uL (ref 150–400)
RBC: 4.93 MIL/uL (ref 4.22–5.81)
RDW: 15.6 % — ABNORMAL HIGH (ref 11.5–15.5)
WBC: 9.2 10*3/uL (ref 4.0–10.5)
nRBC: 0 % (ref 0.0–0.2)

## 2023-11-27 LAB — POTASSIUM: Potassium: 5.5 mmol/L — ABNORMAL HIGH (ref 3.5–5.1)

## 2023-11-27 MED ORDER — HYDRALAZINE HCL 20 MG/ML IJ SOLN: 10.0000 mg | Freq: Four times a day (QID) | INTRAMUSCULAR | Status: DC | PRN

## 2023-11-27 MED ORDER — ENOXAPARIN SODIUM 100 MG/ML IJ SOSY
85.0000 mg | PREFILLED_SYRINGE | Freq: Every day | INTRAMUSCULAR | Status: DC
  Administered 2023-11-27 – 2023-11-30 (×4): 85 mg via SUBCUTANEOUS
  Filled 2023-11-27 (×4): qty 1

## 2023-11-27 MED ORDER — CHLORHEXIDINE GLUCONATE CLOTH 2 % EX PADS
6.0000 | MEDICATED_PAD | Freq: Every day | CUTANEOUS | Status: DC
  Administered 2023-11-27 – 2023-12-03 (×7): 6 via TOPICAL

## 2023-11-27 MED ORDER — DEXTROSE 50 % IV SOLN
1.0000 | Freq: Once | INTRAVENOUS | Status: AC
  Administered 2023-11-27: 50 mL via INTRAVENOUS
  Filled 2023-11-27: qty 50

## 2023-11-27 MED ORDER — INSULIN ASPART 100 UNIT/ML IV SOLN
10.0000 [IU] | Freq: Once | INTRAVENOUS | Status: AC
  Administered 2023-11-27: 10 [IU] via INTRAVENOUS

## 2023-11-27 MED ORDER — SODIUM ZIRCONIUM CYCLOSILICATE 10 G PO PACK
10.0000 g | PACK | Freq: Every day | ORAL | Status: DC
  Administered 2023-11-28: 10 g via ORAL
  Filled 2023-11-27 (×2): qty 1

## 2023-11-27 NOTE — Progress Notes (Addendum)
PROGRESS NOTE    Joseph [name]  WUJ:[id number] DOB: 24-Sep-1971 DOA: 11/26/2023 PCP: Cain Saupe, MD   Brief Narrative:  HPI: Joseph [name] is a 52 y.o. male with medical history significant of morbid obesity, COPD, HFpEF, CAD s/p CABG, anxiety and depression, T2DM complicated by bilateral BKA and peripheral neuropathy, HLD, HTN, GERD, chronic pain and bicuspid aortic valve who presented to the ED for evaluation of lower abdominal pain and hematuria.  Patient states over the last 3 to 4 weeks, he has had intermittent back pain as well as lower abdominal pain. He also reported having occasional dysuria during this time. Yesterday, he noticed blood in his urine as well as some bubbles and occasional clots. He denies any fevers, chills, shortness of breath, headaches, dizziness, bloody stools or hematemesis.   ED course: Initial vitals with temp 97.8, RR 19, HR 89, BP 168/95, SpO2 97% on room air. Labs showed WBC 12.7, Hgb 13.6, platelet 328, sodium 135, K+ 5.2, bicarb 20, glucose 221, BUN/creatinine 46/2.13 (from 55/2.23 6 months ago), UA showed significant glucosuria, proteinuria and hemoglobinuria, positive nitrite, moderate leuks, RBC >50, WBC >50 and many bacteria CT A/P shows emphysematous cystitis but no superimposed obstructive uropathy. Urology was consulted for evaluation  Assessment & Plan:   Principal Problem:   Emphysematous cystitis   Emphysematous cystitis Urology on board.  Still complains of pain and has suprapubic tenderness.  Continue Rocephin and follow culture which is in process.  He is afebrile.  Leukocytosis resolved.  AKI on CKD stage IIIa: Baseline creatinine around 1.7-1.9, presented with 2.3.  Improved and back to baseline now.  Hyperkalemia: Started on [place] as well as dextrose/insulin combination.  Monitor on telemetry and repeat potassium later today.   # T2DM Last A1c 7.4% 7 months ago, hemoglobin A1c pending today.  Continue home regimen which includes  Lantus 35 units twice daily and NovoLog 3 units with meals but hold Farxiga and discontinue metformin.   # HTN Blood pressure still slightly elevated despite of continuing Coreg.  Will add as needed hydralazine.   # HFpEF / Bicuspid aortic valve Patient euvolemic on exam, continue Coreg but hold home diuretics for now.  Marcelline Deist on hold due to active infection.   # CAD s/p CABG # HLD -Patient asymptomatic.  Continue Ranexa, aspirin, atorvastatin, Vascepa and Plavix   # Chronic pain -Continue home oxycodone, Lyrica and tizanidine -As needed IV Dilaudid for breakthrough pain   # Anxiety and depression -Continue Xanax daily at bedtime -Continue Lexapro   # GERD -Continue Protonix   # Class III morbid obesity Body mass index is 72.48 kg/m.  Weight loss and diet modification counseled  DVT prophylaxis: Lovenox   Code Status: Full Code  Family Communication:  None present at bedside.  Plan of care discussed with patient in length and he/she verbalized understanding and agreed with it.  Status is: Observation The patient will require care spanning > 2 midnights and should be moved to inpatient because: Needs IV antibiotics until culture finalized.   Estimated body mass index is 72.48 kg/m as calculated from the following:   Height as of this encounter: 5\' 1"  (1.549 m).   Weight as of this encounter: 174 kg.    Nutritional Assessment: Body mass index is 72.48 kg/m.Marland Kitchen Seen by dietician.  I agree with the assessment and plan as outlined below: Nutrition Status:        . Skin Assessment: I have examined the patient's skin and I agree with the  wound assessment as performed by the wound care RN as outlined below:    Consultants:  Urology  Procedures:  None none  Antimicrobials:  Anti-infectives (From admission, onward)    Start     Dose/Rate Route Frequency Ordered Stop   11/26/23 2200  cefTRIAXone (ROCEPHIN) 2 g in sodium chloride 0.9 % 100 mL IVPB        2 g 200  mL/hr over 30 Minutes Intravenous Every 24 hours 11/26/23 1841     11/26/23 0845  cefTRIAXone (ROCEPHIN) 2 g in sodium chloride 0.9 % 100 mL IVPB        2 g 200 mL/hr over 30 Minutes Intravenous  Once 11/26/23 0830 11/26/23 0948         Subjective: Patient seen and examined.  Still complains of abdominal pain, back pain and shoulder pain, has chronic pain syndrome as well.  No other complaint.  Objective: Vitals:   11/26/23 1614 11/26/23 2117 11/27/23 0246 11/27/23 0539  BP:  (!) 147/86 (!) 167/99 (!) 183/96  Pulse:  91 89 92  Resp:  18 18 16   Temp:  98.7 F (37.1 C) 98.2 F (36.8 C) 98.1 F (36.7 C)  TempSrc:  Oral Oral Oral  SpO2:  96% 93% 93%  Weight: (!) 174 kg     Height: 5\' 1"  (1.549 m)       Intake/Output Summary (Last 24 hours) at 11/27/2023 0850 Last data filed at 11/27/2023 0755 Gross per 24 hour  Intake 800 ml  Output 4675 ml  Net -3875 ml   Filed Weights   11/26/23 0635 11/26/23 1614  Weight: (!) 145.2 kg (!) 174 kg    Examination:  General exam: Appears calm and comfortable  Respiratory system: Clear to auscultation. Respiratory effort normal. Cardiovascular system: S1 & S2 heard, RRR. No JVD, murmurs, rubs, gallops or clicks.  Gastrointestinal system: Abdomen is nondistended, soft and nontender. No organomegaly or masses felt. Normal bowel sounds heard. Central nervous system: Alert and oriented. No focal neurological deficits. Extremities: Bilateral BKA Skin: No rashes, lesions or ulcers Psychiatry: Judgement and insight appear normal. Mood & affect appropriate.    Data Reviewed: I have personally reviewed following labs and imaging studies  CBC: Recent Labs  Lab 11/26/23 0644 11/27/23 0441  WBC 12.7* 9.2  NEUTROABS 8.1*  --   HGB 13.6 13.1  HCT 42.6 43.3  MCV 86.8 87.8  PLT 328 299   Basic Metabolic Panel: Recent Labs  Lab 11/26/23 0644 11/27/23 0441  NA 135 138  K 5.2* 5.7*  CL 106 104  CO2 20* 26  GLUCOSE 221* 121*  BUN 46*  37*  CREATININE 2.13* 1.81*  CALCIUM 8.4* 8.5*   GFR: Estimated Creatinine Clearance: 69.8 mL/min (A) (by C-G formula based on SCr of 1.81 mg/dL (H)). Liver Function Tests: Recent Labs  Lab 11/26/23 0644  AST 18  ALT 18  ALKPHOS 122  BILITOT 0.4  PROT 6.6  ALBUMIN 3.1*   No results for input(s): "LIPASE", "AMYLASE" in the last 168 hours. No results for input(s): "AMMONIA" in the last 168 hours. Coagulation Profile: No results for input(s): "INR", "PROTIME" in the last 168 hours. Cardiac Enzymes: No results for input(s): "CKTOTAL", "CKMB", "CKMBINDEX", "TROPONINI" in the last 168 hours. BNP (last 3 results) Recent Labs    01/13/23 1642 04/25/23 1520  PROBNP 184* 375*   HbA1C: No results for input(s): "HGBA1C" in the last 72 hours. CBG: Recent Labs  Lab 11/26/23 1630 11/26/23 2112 11/27/23 0747  GLUCAP 202* 170* 106*   Lipid Profile: No results for input(s): "CHOL", "HDL", "LDLCALC", "TRIG", "CHOLHDL", "LDLDIRECT" in the last 72 hours. Thyroid Function Tests: No results for input(s): "TSH", "T4TOTAL", "FREET4", "T3FREE", "THYROIDAB" in the last 72 hours. Anemia Panel: No results for input(s): "VITAMINB12", "FOLATE", "FERRITIN", "TIBC", "IRON", "RETICCTPCT" in the last 72 hours. Sepsis Labs: No results for input(s): "PROCALCITON", "LATICACIDVEN" in the last 168 hours.  No results found for this or any previous visit (from the past 240 hour(s)).   Radiology Studies: CT ABDOMEN PELVIS WO CONTRAST  Result Date: 11/26/2023 CLINICAL DATA:  52 year old male with gross hematuria. Back and flank pain. EXAM: CT ABDOMEN AND PELVIS WITHOUT CONTRAST TECHNIQUE: Multidetector CT imaging of the abdomen and pelvis was performed following the standard protocol without IV contrast. RADIATION DOSE REDUCTION: This exam was performed according to the departmental dose-optimization program which includes automated exposure control, adjustment of the mA and/or kV according to patient size  and/or use of iterative reconstruction technique. COMPARISON:  Renal ultrasound 07/01/2022. FINDINGS: Lower chest: Partially visible sternotomy. Normal heart size. No pericardial effusion. Negative lung bases, no pleural effusion. Hepatobiliary: Small layering gallstones in the gallbladder neck on series 2, image 32. Otherwise negative gallbladder. Negative noncontrast liver. Pancreas: Negative. Spleen: Negative. Adrenals/Urinary Tract: Normal adrenal glands. Renal vascular calcifications. Punctate if any superimposed nephrolithiasis (perhaps the left lower pole coronal image 109). No hydronephrosis. Mild symmetric and nonspecific pararenal space stranding. Decompressed ureters. Abnormal gas within the urinary bladder is both non dependent (series 2, image 90) and along the anterior of the bladder wall (coronal image 108 and series 2, image 93). There is underlying diffuse bladder wall thickening and perivesical inflammatory stranding. No calculus within the bladder. Pelvic phleboliths. Stomach/Bowel: Largely decompressed and negative: In the abdomen and pelvis. Decompressed terminal ileum. Normal appendix on coronal image 94 adjacent to the cecum. Fluid-filled but nondilated small bowel. Small volume retained food in the stomach. Negative duodenum. No free air or free fluid. Vascular/Lymphatic: Extensive Aortoiliac calcified atherosclerosis. Normal caliber abdominal aorta. Vascular patency is not evaluated in the absence of IV contrast. No lymphadenopathy. Reproductive: Negative. Other: Some presacral edema but no pelvic free fluid. Musculoskeletal: Advanced lumbar spine degeneration at L3-L4 with subtle anterolisthesis, vacuum disc, facet arthropathy. Partially visible sternotomy. No acute osseous abnormality identified. Generalized abdominal wall and flank subcutaneous edema (series 2, image 91). No soft tissue gas identified. IMPRESSION: 1. Positive for Emphysematous Cystitis. Regional inflammation in the  pelvis. No superimposed obstructive uropathy. Punctate if any nephrolithiasis. 2. No other acute or inflammatory process identified in the noncontrast abdomen or pelvis. Cholelithiasis without CT evidence of acute cholecystitis. Aortic Atherosclerosis (ICD10-I70.0). Electronically Signed   By: Odessa Fleming M.D.   On: 11/26/2023 08:57    Scheduled Meds:  ALPRAZolam  2 mg Oral QHS   aspirin EC  81 mg Oral Daily   atorvastatin  80 mg Oral Daily   carvedilol  6.25 mg Oral BID WC   Chlorhexidine Gluconate Cloth  6 each Topical Daily   clopidogrel  75 mg Oral Daily   insulin aspart  10 Units Intravenous Once   And   dextrose  1 ampule Intravenous Once   enoxaparin (LOVENOX) injection  70 mg Subcutaneous QHS   escitalopram  10 mg Oral Daily   icosapent Ethyl  2 g Oral BID   insulin aspart  0-15 Units Subcutaneous TID WC   insulin aspart  0-5 Units Subcutaneous QHS   insulin aspart  3 Units Subcutaneous TID WC  insulin glargine-yfgn  35 Units Subcutaneous BID   metFORMIN  1,000 mg Oral BID WC   pantoprazole  40 mg Oral Daily   prednisoLONE acetate  1 drop Left Eye QID   pregabalin  150 mg Oral TID   ranolazine  500 mg Oral BID   sodium zirconium cyclosilicate  10 g Oral Daily   tiZANidine  4 mg Oral TID   Continuous Infusions:  cefTRIAXone (ROCEPHIN)  IV Stopped (11/26/23 2157)     LOS: 0 days   Hughie Closs, MD [facility]  11/27/2023, 8:50 AM   *Please note that this is a verbal dictation therefore any spelling or grammatical errors are due to the "Dragon [employer] One" system interpretation.  Please page via Amion and do not message via secure chat for urgent patient care matters. Secure chat can be used for non urgent patient care matters.  How to contact the [facility] Attending or Consulting provider 7A - 7P or covering provider during after hours 7P -7A, for this patient?  Check the care team in [facility] and look for a) attending/consulting [facility] provider listed and b) the [facility] team  listed. Page or secure chat 7A-7P. Log into [email or web address] and use [place]'s universal password to access. If you do not have the password, please contact the hospital operator. Locate the [facility] provider you are looking for under [facility] and page to a number that you can be directly reached. If you still have difficulty reaching the provider, please page the [facility] (Director on Call) for the Hospitalists listed on amion for assistance.

## 2023-11-27 NOTE — Progress Notes (Signed)
Provider on call notified of patient's blood pressure per protocol.   11/27/23 0539  Vitals  Temp 98.1 F (36.7 C)  Temp Source Oral  BP (!) 183/96  MAP (mmHg) 117  BP Location Left Arm  BP Method Automatic  Patient Position (if appropriate) Lying  Pulse Rate 92  Pulse Rate Source Monitor  Resp 16  MEWS COLOR  MEWS Score Color Green  Oxygen Therapy  SpO2 93 %  O2 Device Room Air  MEWS Score  MEWS Temp 0  MEWS Systolic 0  MEWS Pulse 0  MEWS RR 0  MEWS LOC 0  MEWS Score 0

## 2023-11-28 ENCOUNTER — Ambulatory Visit: Payer: Medicare HMO | Admitting: Physical Therapy

## 2023-11-28 ENCOUNTER — Telehealth: Payer: Self-pay | Admitting: Physical Therapy

## 2023-11-28 ENCOUNTER — Encounter (HOSPITAL_COMMUNITY): Payer: Self-pay | Admitting: Family Medicine

## 2023-11-28 DIAGNOSIS — N3080 Other cystitis without hematuria: Secondary | ICD-10-CM | POA: Diagnosis not present

## 2023-11-28 DIAGNOSIS — N183 Chronic kidney disease, stage 3 unspecified: Secondary | ICD-10-CM | POA: Insufficient documentation

## 2023-11-28 DIAGNOSIS — Z6841 Body Mass Index (BMI) 40.0 and over, adult: Secondary | ICD-10-CM

## 2023-11-28 DIAGNOSIS — Z794 Long term (current) use of insulin: Secondary | ICD-10-CM

## 2023-11-28 DIAGNOSIS — E1151 Type 2 diabetes mellitus with diabetic peripheral angiopathy without gangrene: Secondary | ICD-10-CM | POA: Diagnosis not present

## 2023-11-28 DIAGNOSIS — E119 Type 2 diabetes mellitus without complications: Secondary | ICD-10-CM

## 2023-11-28 DIAGNOSIS — N179 Acute kidney failure, unspecified: Secondary | ICD-10-CM

## 2023-11-28 DIAGNOSIS — I700 Atherosclerosis of aorta: Secondary | ICD-10-CM | POA: Insufficient documentation

## 2023-11-28 DIAGNOSIS — N1831 Chronic kidney disease, stage 3a: Secondary | ICD-10-CM

## 2023-11-28 HISTORY — DX: Morbid (severe) obesity due to excess calories: E66.01

## 2023-11-28 HISTORY — DX: Atherosclerosis of aorta: I70.0

## 2023-11-28 LAB — BASIC METABOLIC PANEL
Anion gap: 7 (ref 5–15)
BUN: 35 mg/dL — ABNORMAL HIGH (ref 6–20)
CO2: 24 mmol/L (ref 22–32)
Calcium: 8.4 mg/dL — ABNORMAL LOW (ref 8.9–10.3)
Chloride: 105 mmol/L (ref 98–111)
Creatinine, Ser: 1.7 mg/dL — ABNORMAL HIGH (ref 0.61–1.24)
GFR, Estimated: 49 mL/min — ABNORMAL LOW (ref >60)
Glucose, Bld: 132 mg/dL — ABNORMAL HIGH (ref 70–99)
Potassium: 5.6 mmol/L — ABNORMAL HIGH (ref 3.5–5.1)
Sodium: 136 mmol/L (ref 135–145)

## 2023-11-28 LAB — CBC WITH DIFFERENTIAL/PLATELET
Abs Immature Granulocytes: 0.03 10*3/uL (ref 0.00–0.07)
Basophils Absolute: 0.1 10*3/uL (ref 0.0–0.1)
Basophils Relative: 1 %
Eosinophils Absolute: 0.8 10*3/uL — ABNORMAL HIGH (ref 0.0–0.5)
Eosinophils Relative: 11 %
HCT: 40.6 % (ref 39.0–52.0)
Hemoglobin: 12.7 g/dL — ABNORMAL LOW (ref 13.0–17.0)
Immature Granulocytes: 0 %
Lymphocytes Relative: 27 %
Lymphs Abs: 2 10*3/uL (ref 0.7–4.0)
MCH: 27.3 pg (ref 26.0–34.0)
MCHC: 31.3 g/dL (ref 30.0–36.0)
MCV: 87.1 fL (ref 80.0–100.0)
Monocytes Absolute: 0.9 10*3/uL (ref 0.1–1.0)
Monocytes Relative: 12 %
Neutro Abs: 3.7 10*3/uL (ref 1.7–7.7)
Neutrophils Relative %: 49 %
Platelets: 273 10*3/uL (ref 150–400)
RBC: 4.66 MIL/uL (ref 4.22–5.81)
RDW: 15.4 % (ref 11.5–15.5)
WBC: 7.5 10*3/uL (ref 4.0–10.5)
nRBC: 0 % (ref 0.0–0.2)

## 2023-11-28 LAB — URINE CULTURE: Culture: >=100000 — AB

## 2023-11-28 LAB — GLUCOSE, CAPILLARY
Glucose-Capillary: 181 mg/dL — ABNORMAL HIGH (ref 70–99)
Glucose-Capillary: 153 mg/dL — ABNORMAL HIGH (ref 70–99)
Glucose-Capillary: 95 mg/dL (ref 70–99)
Glucose-Capillary: 91 mg/dL (ref 70–99)

## 2023-11-28 MED ORDER — BUPROPION HCL ER (XL) 150 MG PO TB24
150.0000 mg | ORAL_TABLET | Freq: Every day | ORAL | Status: DC
  Administered 2023-11-28 – 2023-12-03 (×6): 150 mg via ORAL
  Filled 2023-11-28 (×6): qty 1

## 2023-11-28 MED ORDER — DAPAGLIFLOZIN PROPANEDIOL 10 MG PO TABS
10.0000 mg | ORAL_TABLET | Freq: Every day | ORAL | Status: DC
  Administered 2023-11-28 – 2023-12-01 (×4): 10 mg via ORAL
  Filled 2023-11-28 (×4): qty 1

## 2023-11-28 MED ORDER — SODIUM ZIRCONIUM CYCLOSILICATE 10 G PO PACK
10.0000 g | PACK | Freq: Two times a day (BID) | ORAL | Status: DC
  Administered 2023-11-28 – 2023-12-03 (×10): 10 g via ORAL
  Filled 2023-11-28 (×10): qty 1

## 2023-11-28 NOTE — Plan of Care (Signed)
  Problem: Fluid Volume: Goal: Ability to maintain a balanced intake and output will improve Outcome: Progressing   Problem: Metabolic: Goal: Ability to maintain appropriate glucose levels will improve Outcome: Progressing   

## 2023-11-28 NOTE — Progress Notes (Signed)
   11/28/23 2200  BiPAP/CPAP/SIPAP  $ Non-Invasive Home Ventilator  Initial  $ Face Mask Large  Yes  BiPAP/CPAP/SIPAP Pt Type Adult  BiPAP/CPAP/SIPAP DREAMSTATIOND  Mask Type Full face mask  Mask Size Large  Respiratory Rate 16 breaths/min  Flow Rate 2 lpm (2lpm Oxygen added to Auto BiPAP circuit for >92% order)  Patient Home Equipment No  Auto Titrate Yes (<6/>16)  CPAP/SIPAP surface wiped down Yes   Pt. Made aware to notify if needed.

## 2023-11-28 NOTE — Plan of Care (Signed)
  Problem: Education: Goal: Ability to describe self-care measures that may prevent or decrease complications (Diabetes Survival Skills Education) will improve Outcome: Progressing   Problem: Education: Goal: Knowledge of General Education information will improve Description: Including pain rating scale, medication(s)/side effects and non-pharmacologic comfort measures Outcome: Progressing

## 2023-11-28 NOTE — Hospital Course (Addendum)
52 year old man PMH including COPD, CABG, diabetes who presented with lower abdominal pain and hematuria for several weeks.  Admitted for acute emphysematous cystitis.  Consultants Urology   Procedures/Events 12/7 admit for emphysematous cystitis

## 2023-11-28 NOTE — Progress Notes (Signed)
Progress Note   Patient: Joseph [name] [id number] DOB: 05-10-1971 DOA: 11/26/2023     1 DOS: the patient was seen and examined on 11/28/2023   Brief hospital course: 52 year old man PMH including COPD, CABG, diabetes who presented with lower abdominal pain and hematuria for several weeks.  Admitted for acute emphysematous cystitis.  Consultants Urology   Procedures/Events 12/7 admit for emphysematous cystitis  Assessment and Plan: Emphysematous cystitis (Klebsiella)  CT showed emphysematous cystitis with regional inflammation in the pelvis.  No obstructive uropathy.  No other acute or inflammatory process.   Seen by urology with recommendation for admission for treatment until sensitivities available.  Continue Foley catheter.  If symptoms were to worsen could consider scrotal ultrasound if develops testicular pain. Culture shows sensitivity to multiple antibiotics.  Will continue IV ceftriaxone today.  Hopefully can change to oral in the next couple of days. Patient will need a 14-day course for complicated UTI and keep catheter in place until treatment has been completed.  Obtain bladder scans daily to ensure patient is emptying appropriately.   AKI on CKD stage IIIa  Baseline creatinine around 1.7-1.9, presented with 2.3.  Resolved.  Now back to baseline.   Hyperkalemia  Potassium still high at 5.6.  Did not receive Lokelma yesterday for unclear reasons.  No hemolysis noted.  Not on potassium or any likely causative agents. Received Lokelma this morning.  Will repeat in the afternoon.  BMP in AM.   Diabetes mellitus type 2 Hgb Ac1 8.0 CBG stable. Continue home regimen which includes Lantus 35 units twice daily and NovoLog 3 units with meals but hold Farxiga and discontinue metformin.   Essential hypertension Stable.  Continue carvedilol.   HFpEF / Bicuspid aortic valve Patient euvolemic on exam, continue Coreg but hold home diuretics for now.  Marcelline Deist on hold due to  active infection. Can resume diuretics on discharge.   CAD s/p CABG Hyperlipidemia Asymptomatic.  Continue Ranexa, aspirin, atorvastatin, Vascepa and Plavix   Chronic pain Continue home oxycodone, Lyrica and tizanidine   Anxiety and depression Continue Xanax daily at bedtime Continue Lexapro   GERD Continue Protonix   Class III morbid obesity Body mass index is 72.48 kg/m.    Suspected OSA Suspected Obesity hypoventilation syndrome Recommend outpt sleep study Will place on CPAP QHS  Aortic atherosclerosis seen on CT    Subjective:  Feels ok Some bladder pain  Physical Exam: Vitals:   11/27/23 1404 11/27/23 2010 11/28/23 0530 11/28/23 1314  BP: 133/77 (!) 157/92 (!) 145/90 (!) 159/99  Pulse: 84 86 90 91  Resp: 18 18 18 18   Temp: 98 F (36.7 C) 97.9 F (36.6 C) 98.6 F (37 C) 97.8 F (36.6 C)  TempSrc:  Oral Oral Oral  SpO2: 94% 95% 100% 93%  Weight:      Height:       Physical Exam Vitals reviewed.  Constitutional:      General: He is not in acute distress.    Appearance: He is ill-appearing. He is not toxic-appearing.  Cardiovascular:     Rate and Rhythm: Normal rate and regular rhythm.     Heart sounds: No murmur heard. Pulmonary:     Effort: Pulmonary effort is normal. No respiratory distress.     Breath sounds: No wheezing, rhonchi or rales.  Abdominal:     Palpations: Abdomen is soft.  Neurological:     Mental Status: He is alert.  Psychiatric:        Mood and Affect: Mood  normal.        Behavior: Behavior normal.     Comments: Falls asleep repeatedly during interview     Data Reviewed: CBG stable K+ 5.6 Creatinine 2.13 > 1.81 > 1.7 (baseline about 1.7) CBC WNL  Family Communication: none  Disposition: Status is: Inpatient Remains inpatient appropriate because: UTI     Time spent: 35 minutes  Author: Brendia Sacks, MD 11/28/2023 2:30 PM  For on call review [email or web address].

## 2023-11-28 NOTE — Telephone Encounter (Signed)
Spoke with pt, he is currently admitted to hospital and is agreeable to canceling remaining visits and returning with new referral post-receipt of his new prosthetics from [employer].  Camille Bal, PT, DPT

## 2023-11-29 DIAGNOSIS — E119 Type 2 diabetes mellitus without complications: Secondary | ICD-10-CM | POA: Diagnosis not present

## 2023-11-29 DIAGNOSIS — N3080 Other cystitis without hematuria: Secondary | ICD-10-CM | POA: Diagnosis not present

## 2023-11-29 DIAGNOSIS — N179 Acute kidney failure, unspecified: Secondary | ICD-10-CM | POA: Diagnosis not present

## 2023-11-29 DIAGNOSIS — N1831 Chronic kidney disease, stage 3a: Secondary | ICD-10-CM | POA: Diagnosis not present

## 2023-11-29 LAB — GLUCOSE, CAPILLARY
Glucose-Capillary: 123 mg/dL — ABNORMAL HIGH (ref 70–99)
Glucose-Capillary: 113 mg/dL — ABNORMAL HIGH (ref 70–99)
Glucose-Capillary: 97 mg/dL (ref 70–99)
Glucose-Capillary: 172 mg/dL — ABNORMAL HIGH (ref 70–99)

## 2023-11-29 LAB — BASIC METABOLIC PANEL
Anion gap: 7 (ref 5–15)
BUN: 34 mg/dL — ABNORMAL HIGH (ref 6–20)
CO2: 26 mmol/L (ref 22–32)
Calcium: 8.4 mg/dL — ABNORMAL LOW (ref 8.9–10.3)
Chloride: 106 mmol/L (ref 98–111)
Creatinine, Ser: 1.85 mg/dL — ABNORMAL HIGH (ref 0.61–1.24)
GFR, Estimated: 44 mL/min — ABNORMAL LOW (ref >60)
Glucose, Bld: 83 mg/dL (ref 70–99)
Potassium: 5.4 mmol/L — ABNORMAL HIGH (ref 3.5–5.1)
Sodium: 139 mmol/L (ref 135–145)

## 2023-11-29 NOTE — Plan of Care (Signed)
  Problem: Coping: Goal: Ability to adjust to condition or change in health will improve Outcome: Progressing   Problem: Fluid Volume: Goal: Ability to maintain a balanced intake and output will improve Outcome: Progressing

## 2023-11-29 NOTE — Progress Notes (Signed)
Progress Note   Patient: Christifer Etherton Hammad [id number]:[id number] DOB: March 20, 1971 DOA: 11/26/2023     2 DOS: the patient was seen and examined on 11/29/2023   Brief hospital course: 52 year old man PMH including COPD, CABG, diabetes who presented with lower abdominal pain and hematuria for several weeks.  Admitted for acute emphysematous cystitis.  Seen by urology.  Plan 14-day course, keep catheter in place until follow-up with urology.  Hospitalization prolonged by hyperkalemia.  Consultants Urology   Procedures/Events 12/7 admit for emphysematous cystitis  Assessment and Plan: Emphysematous cystitis (Klebsiella)  CT showed emphysematous cystitis with regional inflammation in the pelvis.  No obstructive uropathy.  No other acute or inflammatory process.   Seen by urology with recommendation for admission for treatment until sensitivities available.  Continue Foley catheter.  If symptoms were to worsen could consider scrotal ultrasound if develops testicular pain. No appreciable tenderness on exam, no imaging warranted at this point. Culture shows sensitivity to multiple antibiotics.  Will continue IV ceftriaxone today.  Hopefully can change to oral in the next couple of days. Patient will need a 14-day course for complicated UTI and keep catheter in place until treatment has been completed.  Obtain bladder scans daily to ensure patient is emptying appropriately.   AKI on CKD stage IIIa  Baseline creatinine around 1.7-1.9, presented with 2.3.  Resolved.  Now back to baseline.   Hyperkalemia  Potassium still high at 5.4.  Not on potassium or any likely causative agents. Received Lokelma this morning.  Will repeat in the afternoon.  BMP in AM.   Diabetes mellitus type 2 Hgb Ac1 8.0 CBG remains stable. Continue home regimen which includes Lantus 35 units twice daily and NovoLog 3 units with meals but hold Farxiga and discontinue metformin.   Essential hypertension Remains stable.  Continue  carvedilol.   HFpEF / Bicuspid aortic valve Patient euvolemic on exam, continue Coreg but hold home diuretics for now.  Marcelline Deist on hold due to active infection. Can resume diuretics on discharge.   CAD s/p CABG Hyperlipidemia Asymptomatic.  Continue Ranexa, aspirin, atorvastatin, Vascepa and Plavix   Chronic pain Continue home oxycodone, Lyrica and tizanidine   Anxiety and depression Continue Xanax daily at bedtime Continue Lexapro   GERD Continue Protonix   Class III morbid obesity Body mass index is 72.48 kg/m.     Suspected OSA Suspected Obesity hypoventilation syndrome Recommend outpt sleep study Will place on CPAP QHS   Aortic atherosclerosis seen on CT      Subjective:  Feels better Mild testicle tenderness  Physical Exam: Vitals:   11/28/23 1314 11/28/23 2046 11/29/23 0502 11/29/23 1311  BP: (!) 159/99 (!) 155/90 115/86 (!) 146/93  Pulse: 91 92 87 90  Resp: 18 20 18 18   Temp: 97.8 F (36.6 C) 98.7 F (37.1 C) 98.9 F (37.2 C) 98.2 F (36.8 C)  TempSrc: Oral Oral Oral Oral  SpO2: 93% 90% 92% 90%  Weight:      Height:       Physical Exam Vitals reviewed.  Constitutional:      General: He is not in acute distress.    Appearance: He is not ill-appearing or toxic-appearing.  Cardiovascular:     Rate and Rhythm: Normal rate and regular rhythm.     Heart sounds: No murmur heard. Pulmonary:     Effort: Pulmonary effort is normal. No respiratory distress.     Breath sounds: No wheezing, rhonchi or rales.  Neurological:     Mental Status: He is  alert.  Psychiatric:        Mood and Affect: Mood normal.        Behavior: Behavior normal.   Scrotum appears unremarkable.  Nontender to palpation.  Data Reviewed: CBG stable Potassium still elevated at 5.4 Creatinine stable at 1.85  Family Communication: non  Disposition: Status is: Inpatient Remains inpatient appropriate because: hyperkalemia     Time spent: 20 seen by urology.  Plan  14-day course, can switch to oral on discharge.  Minutes  Author: Brendia Sacks, MD 11/29/2023 4:39 PM  For on call review [email or web address].

## 2023-11-29 NOTE — Plan of Care (Signed)
  Problem: Education: Goal: Ability to describe self-care measures that may prevent or decrease complications (Diabetes Survival Skills Education) will improve Outcome: Progressing   Problem: Nutritional: Goal: Maintenance of adequate nutrition will improve Outcome: Progressing   

## 2023-11-29 NOTE — TOC CM/SW Note (Signed)
Transition of Care [facility]) - Inpatient Brief Assessment   Patient Details  Name: Joseph [name] MRN: [id number] Date of Birth: Jul 10, 1971  Transition of [facility]) CM/SW Contact:    Darleene Cleaver, LCSW Phone Number: 11/29/2023, 6:11 PM   Clinical Narrative:  Patient has a PCP, lives with his wife, and does not have any other SDOH needs.  Patient does have transportation to and from appointments.  Patient does have insurance, no anticipated TOC needs.  Transition of Care Asessment: Insurance and Status: Insurance coverage has been reviewed Patient has primary care physician: Yes Home environment has been reviewed: Yes lives with wife Prior level of function:: Independent Prior/Current Home Services: No current home services Social Determinants of Health Reivew: SDOH reviewed no interventions necessary Readmission risk has been reviewed: Yes Transition of care needs: no transition of care needs at this time

## 2023-11-30 ENCOUNTER — Inpatient Hospital Stay (HOSPITAL_COMMUNITY): Payer: Medicare HMO

## 2023-11-30 ENCOUNTER — Ambulatory Visit: Payer: Medicare HMO | Admitting: Physical Therapy

## 2023-11-30 DIAGNOSIS — N3080 Other cystitis without hematuria: Secondary | ICD-10-CM | POA: Diagnosis not present

## 2023-11-30 LAB — BASIC METABOLIC PANEL
Anion gap: 7 (ref 5–15)
BUN: 34 mg/dL — ABNORMAL HIGH (ref 6–20)
CO2: 24 mmol/L (ref 22–32)
Calcium: 8.4 mg/dL — ABNORMAL LOW (ref 8.9–10.3)
Chloride: 105 mmol/L (ref 98–111)
Creatinine, Ser: 1.77 mg/dL — ABNORMAL HIGH (ref 0.61–1.24)
GFR, Estimated: 46 mL/min — ABNORMAL LOW (ref >60)
Glucose, Bld: 153 mg/dL — ABNORMAL HIGH (ref 70–99)
Potassium: 5.1 mmol/L (ref 3.5–5.1)
Sodium: 136 mmol/L (ref 135–145)

## 2023-11-30 LAB — GLUCOSE, CAPILLARY
Glucose-Capillary: 295 mg/dL — ABNORMAL HIGH (ref 70–99)
Glucose-Capillary: 207 mg/dL — ABNORMAL HIGH (ref 70–99)
Glucose-Capillary: 174 mg/dL — ABNORMAL HIGH (ref 70–99)
Glucose-Capillary: 281 mg/dL — ABNORMAL HIGH (ref 70–99)

## 2023-11-30 MED ORDER — POLYETHYLENE GLYCOL 3350 17 G PO PACK
17.0000 g | PACK | Freq: Every day | ORAL | Status: DC
  Administered 2023-11-30 – 2023-12-03 (×4): 17 g via ORAL
  Filled 2023-11-30 (×4): qty 1

## 2023-11-30 MED ORDER — KETOTIFEN FUMARATE 0.035 % OP SOLN
1.0000 [drp] | Freq: Two times a day (BID) | OPHTHALMIC | Status: DC
  Administered 2023-11-30 – 2023-12-03 (×6): 1 [drp] via OPHTHALMIC
  Filled 2023-11-30: qty 5

## 2023-11-30 MED ORDER — SENNOSIDES-DOCUSATE SODIUM 8.6-50 MG PO TABS
1.0000 | ORAL_TABLET | Freq: Two times a day (BID) | ORAL | Status: DC
  Administered 2023-11-30 – 2023-12-01 (×2): 1 via ORAL
  Filled 2023-11-30 (×2): qty 1

## 2023-11-30 MED ORDER — PROMETHAZINE HCL 25 MG PO TABS: 25.0000 mg | ORAL_TABLET | Freq: Four times a day (QID) | ORAL | Status: DC | PRN

## 2023-11-30 NOTE — Progress Notes (Signed)
[facility] Progress Note Patient: Joseph [name] [id number] DOB: 1971-07-26 DOA: 11/26/2023  DOS: the patient was seen and examined on 11/30/2023  52 year old man PMH including COPD, CABG, diabetes who presented with lower abdominal pain and hematuria for several weeks.  Admitted for acute emphysematous cystitis.  Seen by urology.  Plan 14-day course, keep catheter in place until follow-up with urology.  Hospitalization prolonged by hyperkalemia.  Consultants Urology   Procedures/Events 12/7 admit for emphysematous cystitis   Assessment and Plan: Emphysematous cystitis (Klebsiella)  CT showed emphysematous cystitis with regional inflammation in the pelvis.  No obstructive uropathy.  No other acute or inflammatory process.   Seen by urology with recommendation for admission for treatment until sensitivities available.  Continue Foley catheter.  If symptoms were to worsen could consider scrotal ultrasound if develops testicular pain. No appreciable tenderness on exam, no imaging warranted at this point. Culture shows sensitivity to multiple antibiotics.  Switch to p.o. tomorrow. Patient will need a 14-day course for complicated UTI and keep catheter in place until treatment has been completed.     AKI on CKD stage IIIa  Baseline creatinine around 1.7-1.9, presented with 2.3.  Resolved.  Now back to baseline.   Hyperkalemia  Potassium improving.  So far no BM. Continue Lokelma.  Recheck tomorrow.  Diabetes mellitus type 2 Hgb Ac1 8.0 Continue sliding scale insulin.  Essential hypertension Remains stable.  Continue carvedilol.   HFpEF / Bicuspid aortic valve Patient euvolemic on exam, continue Coreg but hold home diuretics for now.  Marcelline Deist on hold due to active infection. Can resume diuretics on discharge.   CAD s/p CABG Hyperlipidemia Asymptomatic.  Continue Ranexa, aspirin, atorvastatin, Vascepa and Plavix   Chronic pain Continue home oxycodone, Lyrica and  tizanidine   Anxiety and depression Continue Xanax daily at bedtime Continue Lexapro   GERD Continue Protonix   Class III morbid obesity Body mass index is 72.48 kg/m.     Suspected OSA Suspected Obesity hypoventilation syndrome Recommend outpt sleep study Continue CPAP at bedtime  Subjective: Reports scrotal pain.  No nausea no vomiting.  Also reports lower abdominal pain.  Reports last BM was 1 week ago.  Physical Exam: General: in Mild distress, No Rash Cardiovascular: S1 and S2 Present, No Murmur Respiratory: Good respiratory effort, Bilateral Air entry present. No Crackles, No wheezes Abdomen: Bowel Sound present, No tenderness, no significant scrotal edema or redness or warmth.  Subjective tenderness Extremities: No edema Neuro: Alert and oriented x3, no new focal deficit  Data Reviewed: I have Reviewed nursing notes, Vitals, and Lab results. Since last encounter, pertinent lab results CBC and BMP   . I have ordered test including CBC and BMP  . I have ordered imaging ultrasound scrotum  .   Disposition: Status is: Inpatient Remains inpatient appropriate because: Monitor for improvement in pain control  Family Communication: No one at bedside Level of care: Med-Surg   Vitals:   11/29/23 2102 11/30/23 0602 11/30/23 1408 11/30/23 1631  BP: (!) 167/84 131/85 (!) 165/102 (!) 156/87  Pulse: 83 80 87 86  Resp: 18 18    Temp: 98.4 F (36.9 C) 98.1 F (36.7 C) 98.1 F (36.7 C)   TempSrc: Oral Oral Oral   SpO2: 92% 91% 92% 98%  Weight:      Height:         Author: Lynden Oxford, MD 11/30/2023 7:26 PM  Please look on [email or web address] to find out who is on call.

## 2023-11-30 NOTE — Plan of Care (Signed)
  Problem: Health Behavior/Discharge Planning: Goal: Ability to manage health-related needs will improve Outcome: Progressing   Problem: Metabolic: Goal: Ability to maintain appropriate glucose levels will improve Outcome: Progressing   

## 2023-11-30 NOTE — Progress Notes (Signed)
   11/30/23 0005  BiPAP/CPAP/SIPAP  $ Non-Invasive Home Ventilator  Subsequent  BiPAP/CPAP/SIPAP Pt Type Adult  BiPAP/CPAP/SIPAP DREAMSTATIOND  Mask Type Full face mask  Mask Size Large  Respiratory Rate 18 breaths/min  Flow Rate 2 lpm  Patient Home Equipment No  Auto Titrate Yes  CPAP/SIPAP surface wiped down Yes   Pt. made aware to notify if needed.

## 2023-12-01 DIAGNOSIS — N3080 Other cystitis without hematuria: Secondary | ICD-10-CM | POA: Diagnosis not present

## 2023-12-01 LAB — BASIC METABOLIC PANEL
Anion gap: 7 (ref 5–15)
BUN: 32 mg/dL — ABNORMAL HIGH (ref 6–20)
CO2: 28 mmol/L (ref 22–32)
Calcium: 8.5 mg/dL — ABNORMAL LOW (ref 8.9–10.3)
Chloride: 104 mmol/L (ref 98–111)
Creatinine, Ser: 1.86 mg/dL — ABNORMAL HIGH (ref 0.61–1.24)
GFR, Estimated: 44 mL/min — ABNORMAL LOW (ref >60)
Glucose, Bld: 125 mg/dL — ABNORMAL HIGH (ref 70–99)
Potassium: 5 mmol/L (ref 3.5–5.1)
Sodium: 139 mmol/L (ref 135–145)

## 2023-12-01 LAB — CBC
HCT: 39.2 % (ref 39.0–52.0)
Hemoglobin: 12.3 g/dL — ABNORMAL LOW (ref 13.0–17.0)
MCH: 27.3 pg (ref 26.0–34.0)
MCHC: 31.4 g/dL (ref 30.0–36.0)
MCV: 86.9 fL (ref 80.0–100.0)
Platelets: 270 10*3/uL (ref 150–400)
RBC: 4.51 MIL/uL (ref 4.22–5.81)
RDW: 15.1 % (ref 11.5–15.5)
WBC: 8 10*3/uL (ref 4.0–10.5)
nRBC: 0 % (ref 0.0–0.2)

## 2023-12-01 LAB — GLUCOSE, CAPILLARY
Glucose-Capillary: 126 mg/dL — ABNORMAL HIGH (ref 70–99)
Glucose-Capillary: 112 mg/dL — ABNORMAL HIGH (ref 70–99)
Glucose-Capillary: 84 mg/dL (ref 70–99)
Glucose-Capillary: 88 mg/dL (ref 70–99)
Glucose-Capillary: 138 mg/dL — ABNORMAL HIGH (ref 70–99)

## 2023-12-01 LAB — MAGNESIUM: Magnesium: 1.9 mg/dL (ref 1.7–2.4)

## 2023-12-01 MED ORDER — CEFDINIR 300 MG PO CAPS
300.0000 mg | ORAL_CAPSULE | Freq: Two times a day (BID) | ORAL | Status: DC
  Administered 2023-12-01: 300 mg via ORAL
  Filled 2023-12-01: qty 1

## 2023-12-01 MED ORDER — CEFADROXIL 500 MG PO CAPS
1000.0000 mg | ORAL_CAPSULE | Freq: Two times a day (BID) | ORAL | Status: DC
  Administered 2023-12-01 – 2023-12-03 (×5): 1000 mg via ORAL
  Filled 2023-12-01 (×5): qty 2

## 2023-12-01 MED ORDER — SENNOSIDES-DOCUSATE SODIUM 8.6-50 MG PO TABS
2.0000 | ORAL_TABLET | Freq: Two times a day (BID) | ORAL | Status: DC
Start: 2023-12-01 — End: 2023-12-03
  Administered 2023-12-01 – 2023-12-03 (×3): 2 via ORAL
  Filled 2023-12-01 (×4): qty 2

## 2023-12-01 MED ORDER — PREGABALIN 75 MG PO CAPS
100.0000 mg | ORAL_CAPSULE | Freq: Three times a day (TID) | ORAL | Status: DC
  Administered 2023-12-01: 100 mg via ORAL
  Filled 2023-12-01: qty 1

## 2023-12-01 MED ORDER — HEPARIN SODIUM (PORCINE) 5000 UNIT/ML IJ SOLN
5000.0000 [IU] | Freq: Three times a day (TID) | INTRAMUSCULAR | Status: DC
  Administered 2023-12-01 – 2023-12-02 (×3): 5000 [IU] via SUBCUTANEOUS
  Filled 2023-12-01 (×3): qty 1

## 2023-12-01 MED ORDER — TIZANIDINE HCL 4 MG PO TABS: 4.0000 mg | ORAL_TABLET | Freq: Three times a day (TID) | ORAL | Status: DC | PRN

## 2023-12-01 MED ORDER — OXYCODONE HCL 5 MG PO TABS
10.0000 mg | ORAL_TABLET | Freq: Four times a day (QID) | ORAL | Status: DC | PRN
  Administered 2023-12-02 (×2): 10 mg via ORAL
  Filled 2023-12-01 (×3): qty 2

## 2023-12-01 MED ORDER — LACTULOSE 10 GM/15ML PO SOLN
30.0000 g | Freq: Three times a day (TID) | ORAL | Status: AC
  Administered 2023-12-01 (×2): 30 g via ORAL
  Filled 2023-12-01 (×2): qty 45

## 2023-12-01 MED ORDER — PREGABALIN 75 MG PO CAPS
75.0000 mg | ORAL_CAPSULE | Freq: Three times a day (TID) | ORAL | Status: DC
  Administered 2023-12-01 – 2023-12-02 (×2): 75 mg via ORAL
  Filled 2023-12-01 (×2): qty 1

## 2023-12-01 MED ORDER — ALPRAZOLAM 0.5 MG PO TABS: 1.0000 mg | ORAL_TABLET | Freq: Every evening | ORAL | Status: DC | PRN

## 2023-12-01 MED ORDER — OXYCODONE HCL 5 MG PO TABS: 15.0000 mg | ORAL_TABLET | Freq: Four times a day (QID) | ORAL | Status: DC | PRN

## 2023-12-01 NOTE — Progress Notes (Signed)
PT Cancellation Note  Patient Details Name: Joseph [name] MRN: [id number] DOB: 1971-02-19   Cancelled Treatment:    Reason Eval/Treat Not Completed: Fatigue/lethargy limiting ability to participate (pt sleeping soundly, did not respond to verbal nor tactile stimulation. Will try another time.)   Tamala Ser PT 12/01/2023  Acute Rehabilitation Services  Office [phone]

## 2023-12-01 NOTE — Plan of Care (Signed)
   Problem: Coping: Goal: Ability to adjust to condition or change in health will improve Outcome: Progressing   Problem: Fluid Volume: Goal: Ability to maintain a balanced intake and output will improve Outcome: Progressing   Problem: Nutritional: Goal: Maintenance of adequate nutrition will improve Outcome: Adequate for Discharge

## 2023-12-01 NOTE — Progress Notes (Signed)
[facility] Progress Note Patient: Joseph [name] [id number]:[id number] DOB: 11/07/1971 DOA: 11/26/2023  DOS: the patient was seen and examined on 12/01/2023  52 year old man PMH including COPD, CABG, diabetes who presented with lower abdominal pain and hematuria for several weeks.  Admitted for acute emphysematous cystitis.  Seen by urology.  Plan 14-day course, keep catheter in place until follow-up with urology.  Hospitalization prolonged by hyperkalemia.  Consultants Urology   Procedures/Events 12/7 admit for emphysematous cystitis   Assessment and Plan: Emphysematous cystitis (Klebsiella)  CT showed emphysematous cystitis with regional inflammation in the pelvis.  No obstructive uropathy.  No other acute or inflammatory process.   Seen by urology with recommendation for admission for treatment until sensitivities available.  Continue Foley catheter.  If symptoms were to worsen could consider scrotal ultrasound if develops testicular pain. No appreciable tenderness on exam, no imaging warranted at this point. Culture shows sensitivity to multiple antibiotics.  Switch to p.o. tomorrow. Patient will need a 14-day course for complicated UTI and keep catheter in place until treatment has been completed.     AKI on CKD stage IIIa  Baseline creatinine around 1.7-1.9, presented with 2.3.  Resolved.  Now back to baseline.   Hyperkalemia  Potassium improving.  So far no BM. Continue Lokelma.   Diabetes mellitus type 2 Hgb Ac1 8.0 Continue sliding scale insulin.  Essential hypertension Remains stable.  Continue carvedilol.   HFpEF / Bicuspid aortic valve Patient euvolemic on exam, continue Coreg but hold home diuretics for now.  Marcelline Deist on hold due to active infection. Can resume diuretics on discharge.   CAD s/p CABG Hyperlipidemia Asymptomatic.  Continue Ranexa, aspirin, atorvastatin, Vascepa and Plavix   Chronic pain Continue home oxycodone, Lyrica and tizanidine. Will switch  tizanidine to as needed as well as reduce the dose of oxycodone and Lyrica to address his sleepiness.   Anxiety and depression Continue Xanax daily at bedtime will reduce the dose due to sleepiness. Continue Lexapro   GERD Continue Protonix   Class III morbid obesity Body mass index is 72.48 kg/m.     Suspected OSA Suspected Obesity hypoventilation syndrome Recommend outpt sleep study Continue CPAP at bedtime  Subjective: Drowsy throughout the day.  No nausea no vomiting no fever no chills.  Physical Exam: Bilateral BKA. Drowsy. Was awake later in the day. No asterixis.  No focal deficit.  Data Reviewed: I have Reviewed nursing notes, Vitals, and Lab results. Reviewed CBC and CMP.  Reordered CBC and BMP.  Disposition: Status is: Inpatient Remains inpatient appropriate because: Monitor for improvement in pain control and mentation. Family Communication: No one at bedside Level of care: Med-Surg   Vitals:   11/30/23 2055 12/01/23 0535 12/01/23 1341 12/01/23 1657  BP: (!) 163/85 139/71 117/86 (!) 150/77  Pulse: 86 85 83 80  Resp: 15 16 18 18   Temp: 98 F (36.7 C) 99 F (37.2 C) (!) 97.4 F (36.3 C) 98.3 F (36.8 C)  TempSrc: Oral Oral Oral Oral  SpO2: (!) 89% 94% (!) 86% 96%  Weight:      Height:         Author: Lynden Oxford, MD 12/01/2023 7:10 PM  Please look on [email or web address] to find out who is on call.

## 2023-12-01 NOTE — Progress Notes (Signed)
   12/01/23 2311  BiPAP/CPAP/SIPAP  BiPAP/CPAP/SIPAP Pt Type Adult  BiPAP/CPAP/SIPAP DREAMSTATIOND  Mask Type Full face mask  Mask Size Large  Respiratory Rate 18 breaths/min  Flow Rate 2 lpm  Patient Home Equipment No  Auto Titrate Yes

## 2023-12-01 NOTE — Plan of Care (Signed)
  Problem: Education: Goal: Ability to describe self-care measures that may prevent or decrease complications (Diabetes Survival Skills Education) will improve Outcome: Progressing Goal: Individualized Educational Video(s) Outcome: Progressing   Problem: Coping: Goal: Ability to adjust to condition or change in health will improve Outcome: Progressing   

## 2023-12-02 ENCOUNTER — Inpatient Hospital Stay (HOSPITAL_COMMUNITY): Payer: Medicare HMO

## 2023-12-02 DIAGNOSIS — N3080 Other cystitis without hematuria: Secondary | ICD-10-CM | POA: Diagnosis not present

## 2023-12-02 LAB — BASIC METABOLIC PANEL
Anion gap: 6 (ref 5–15)
BUN: 32 mg/dL — ABNORMAL HIGH (ref 6–20)
CO2: 29 mmol/L (ref 22–32)
Calcium: 8.4 mg/dL — ABNORMAL LOW (ref 8.9–10.3)
Chloride: 103 mmol/L (ref 98–111)
Creatinine, Ser: 1.67 mg/dL — ABNORMAL HIGH (ref 0.61–1.24)
GFR, Estimated: 50 mL/min — ABNORMAL LOW (ref >60)
Glucose, Bld: 154 mg/dL — ABNORMAL HIGH (ref 70–99)
Potassium: 4.6 mmol/L (ref 3.5–5.1)
Sodium: 138 mmol/L (ref 135–145)

## 2023-12-02 LAB — GLUCOSE, CAPILLARY
Glucose-Capillary: 90 mg/dL (ref 70–99)
Glucose-Capillary: 171 mg/dL — ABNORMAL HIGH (ref 70–99)
Glucose-Capillary: 186 mg/dL — ABNORMAL HIGH (ref 70–99)
Glucose-Capillary: 254 mg/dL — ABNORMAL HIGH (ref 70–99)

## 2023-12-02 LAB — CBC
HCT: 39.2 % (ref 39.0–52.0)
Hemoglobin: 12 g/dL — ABNORMAL LOW (ref 13.0–17.0)
MCH: 26.7 pg (ref 26.0–34.0)
MCHC: 30.6 g/dL (ref 30.0–36.0)
MCV: 87.1 fL (ref 80.0–100.0)
Platelets: 268 10*3/uL (ref 150–400)
RBC: 4.5 MIL/uL (ref 4.22–5.81)
RDW: 14.8 % (ref 11.5–15.5)
WBC: 8.5 10*3/uL (ref 4.0–10.5)
nRBC: 0 % (ref 0.0–0.2)

## 2023-12-02 LAB — MAGNESIUM: Magnesium: 2 mg/dL (ref 1.7–2.4)

## 2023-12-02 MED ORDER — OXYCODONE HCL 5 MG PO TABS
15.0000 mg | ORAL_TABLET | ORAL | Status: DC | PRN
  Administered 2023-12-02 – 2023-12-03 (×4): 15 mg via ORAL
  Filled 2023-12-02 (×4): qty 3

## 2023-12-02 MED ORDER — TAMSULOSIN HCL 0.4 MG PO CAPS
0.4000 mg | ORAL_CAPSULE | Freq: Every day | ORAL | Status: DC
  Administered 2023-12-02 – 2023-12-03 (×2): 0.4 mg via ORAL
  Filled 2023-12-02 (×2): qty 1

## 2023-12-02 MED ORDER — TORSEMIDE 20 MG PO TABS
60.0000 mg | ORAL_TABLET | Freq: Every day | ORAL | Status: DC
  Administered 2023-12-03: 60 mg via ORAL
  Filled 2023-12-02: qty 3

## 2023-12-02 MED ORDER — ALPRAZOLAM 0.5 MG PO TABS: 2.0000 mg | ORAL_TABLET | Freq: Every evening | ORAL | Status: DC | PRN

## 2023-12-02 MED ORDER — SORBITOL 70 % SOLN
60.0000 mL | Freq: Once | Status: AC
  Administered 2023-12-02: 60 mL via ORAL
  Filled 2023-12-02: qty 60

## 2023-12-02 MED ORDER — PREGABALIN 75 MG PO CAPS
100.0000 mg | ORAL_CAPSULE | Freq: Three times a day (TID) | ORAL | Status: DC
  Administered 2023-12-02 – 2023-12-03 (×3): 100 mg via ORAL
  Filled 2023-12-02 (×3): qty 1

## 2023-12-02 MED ORDER — BISACODYL 10 MG RE SUPP
10.0000 mg | Freq: Once | RECTAL | Status: AC
  Administered 2023-12-02: 10 mg via RECTAL
  Filled 2023-12-02: qty 1

## 2023-12-02 NOTE — TOC Progression Note (Signed)
Transition of Care [facility]) - Progression Note    Patient Details  Name: KARLIN PATRICIO MRN: [id number] Date of Birth: [birth year]  Transition of Care [facility]) CM/SW Contact  Darleene Cleaver, [name] Phone Number: 12/02/2023, 4:48 PM  Clinical Narrative:     CSW was informed that patient will need a CPAP at home.  He used to have one in 2014, but no longer has it.  CSW contacted Mitch at [facility], and they can not get him one without having a new sleep study completed.  CSW updated patient and told him he will have to do sleep study outpatient, he expressed understanding.  Per MD patient will have an oximetry test done tonight to try to get oxygen ordered for home at night.  Per Mitch from [employer] he can get the oxygen for night tomorrow, but referral will have to be made.  CSW to update weekend [facility] team member.         Expected Discharge Plan and Services    Home                                           Social Determinants of Health (SDOH) Interventions SDOH Screenings   Food Insecurity: No Food Insecurity (11/26/2023)  Housing: Low Risk  (12/01/2023)  Transportation Needs: No Transportation Needs (11/29/2023)  Recent Concern: Transportation Needs - Unmet Transportation Needs (11/26/2023)  Utilities: Not At Risk (11/26/2023)  Depression (PHQ2-9): Low Risk  (05/06/2021)  Financial Resource Strain: Medium Risk (09/09/2022)  Social Connections: Unknown (10/03/2023)   Received from [facility]  Stress: No Stress Concern Present (10/10/2023)   Received from [facility]  Tobacco Use: Medium Risk (11/26/2023)    Readmission Risk Interventions    05/03/2023   11:58 AM 11/24/2021    4:05 PM 08/20/2021   12:26 PM  Readmission Risk Prevention Plan  Transportation Screening Complete Complete Complete  PCP or Specialist Appt within 3-5 Days Complete    HRI or Home Care Consult Complete    Social Work Consult for Recovery Care Planning/Counseling Complete    Palliative  Care Screening Not Applicable    Medication Review Oceanographer) Referral to Pharmacy Complete Complete  PCP or Specialist appointment within 3-5 days of discharge  Complete Complete  HRI or Home Care Consult  Complete Complete  SW Recovery Care/Counseling Consult  Complete Complete  Palliative Care Screening  Not Applicable Not Applicable  Skilled Nursing Facility  Complete Not Applicable

## 2023-12-02 NOTE — Progress Notes (Signed)
RT placed patient on Overnight sleep study to check oxygenation per MD. Sarita Bottom is done on RA

## 2023-12-02 NOTE — Plan of Care (Signed)
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
## 2023-12-02 NOTE — Evaluation (Addendum)
Physical Therapy Evaluation Patient Details Name: Joseph [name] MRN: [id number] DOB: February 21, 1971 Today's Date: 12/02/2023  History of Present Illness  52 year old man who presented with lower abdominal pain and hematuria for several weeks.  Admitted for acute emphysematous cystitis. Hospitalization prolonged by hyperkalemia      PMH including COPD, CABG, diabetes, B BKA, MI, PE, DM, DVT, morbid obesity, peripheral nerve disease.  Clinical Impression  Pt admitted with above diagnosis. Pt reports his B BKA prostheses do not fit properly and that he had an appointment at outpt PT with prosthetist for re-fitting on 12/9 that he missed due to issues with his bladder. Pt was going to outpt PT prior to this admission for treatment of R shoulder pain and falls, which were attributed to poorly fitting prostheses. Pt reports at home he was able to transfer independently to his motorized WC standing directly on his residual limbs. He hopes to return to walking with a rollator when he obtains new properly fitting prostheses. He stated he can manage at home in his current condition. He was able to independently perform supine to sit and sat edge of bed x 20 minutes. Mechanical lift would be required for transfers. Encouraged pt to change positions in bed during the day (back to L side to R side). Return to outpt PT for fitting of new prostheses recommended.  Pt currently with functional limitations due to the deficits listed below (see PT Problem List). Pt will benefit from acute skilled PT to increase their independence and safety with mobility to allow discharge.           If plan is discharge home, recommend the following: A little help with bathing/dressing/bathroom;Assistance with cooking/housework;Assist for transportation   Can travel by private vehicle        Equipment Recommendations None recommended by PT  Recommendations for Other Services    OT (order placed)   Functional Status Assessment  Patient has had a recent decline in their functional status and demonstrates the ability to make significant improvements in function in a reasonable and predictable amount of time.     Precautions / Restrictions Precautions Precautions: Fall Precaution Comments: B BKA; multiple falls in early November related to prosthetics not fitting properly Restrictions Weight Bearing Restrictions Per Provider Order: No      Mobility  Bed Mobility Overal bed mobility: Modified Independent             General bed mobility comments: HOB up, used rail; pt sat EOB x 20 minutes, no loss of balance, BLEs and BUEs unsupported    Transfers                   General transfer comment: NT- pt does not have prostheses here    Ambulation/Gait                  Stairs            Wheelchair Mobility     Tilt Bed    Modified Rankin (Stroke Patients Only)       Balance Overall balance assessment: Needs assistance   Sitting balance-Leahy Scale: Good                                       Pertinent Vitals/Pain Pain Assessment Pain Assessment: Faces Faces Pain Scale: Hurts even more Pain Location: R shoulder with elevation Pain Descriptors / Indicators: Sore,  Grimacing Pain Intervention(s): Limited activity within patient's tolerance, Monitored during session, Repositioned, Premedicated before session    Home Living Family/patient expects to be discharged to:: Private residence Living Arrangements: Spouse/significant other;Children Available Help at Discharge: Family;Available 24 hours/day Type of Home: House Home Access: Ramped entrance       Home Layout: One level Home Equipment: Wheelchair - power;Shower seat;Cane - single point;Wheelchair Financial trader (4 wheels) Additional Comments: has B BKA protheses but they fit poorly due to swelling in stumps, pt had a recent appointment to be refitted at outpt PT but couldn't make that appointment  due to bladder issues. Wife is going through cancer treatments so is limited in ability to assist. Son is 50 y.o. Aunt and uncle help some.    Prior Function Prior Level of Function : Needs assist             Mobility Comments: Has been transferring to motorized WC standing on B stumps or using poorly fitted prosthetics; uses motoized WC to go to nearby grocery store. When able to walk with protheses he uses a rollator. Stated he does do walk short distances in prostheses but mostly uses WC. Fall in November resulting in R shoulder pain, he was being treated for this at outpt PT. ADLs Comments: Pt reports scooting into bathroom on his bottom then "Standing" on residual B LE to pull himself up and into tub.     Extremity/Trunk Assessment   Upper Extremity Assessment Upper Extremity Assessment: RUE deficits/detail;Right hand dominant;LUE deficits/detail RUE Deficits / Details: recent fall with R shoulder injury, imaging negative for fx, was being treated at outpt PT; B hands numb to light touch RUE Sensation: decreased light touch;history of peripheral neuropathy LUE Deficits / Details: B hands numb to light touch LUE Sensation: decreased light touch;history of peripheral neuropathy    Lower Extremity Assessment Lower Extremity Assessment: RLE deficits/detail;LLE deficits/detail RLE Deficits / Details: a few small scabs noted distal/anterior residual limb, pt reports this is from walking directly on residual limb. Knee ext +4/5. RLE Sensation: WNL LLE Deficits / Details: knee ext +4/5 LLE Sensation: WNL    Cervical / Trunk Assessment Cervical / Trunk Assessment: Normal  Communication   Communication Communication: No apparent difficulties  Cognition Arousal: Alert Behavior During Therapy: WFL for tasks assessed/performed Overall Cognitive Status: Within Functional Limits for tasks assessed                                          General Comments       Exercises     Assessment/Plan    PT Assessment Patient needs continued PT services  PT Problem List Decreased mobility;Obesity;Pain       PT Treatment Interventions Functional mobility training;Therapeutic activities;Patient/family education;Therapeutic exercise    PT Goals (Current goals can be found in the Care Plan section)  Acute Rehab PT Goals Patient Stated Goal: get new prosthetics that fit PT Goal Formulation: With patient Time For Goal Achievement: 12/16/23 Potential to Achieve Goals: Good    Frequency Min 1X/week     Co-evaluation               AM-PAC PT "6 Clicks" Mobility  Outcome Measure Help needed turning from your back to your side while in a flat bed without using bedrails?: A Little Help needed moving from lying on your back to sitting on the side of a flat  bed without using bedrails?: A Little Help needed moving to and from a bed to a chair (including a wheelchair)?: Total Help needed standing up from a chair using your arms (e.g., wheelchair or bedside chair)?: Total Help needed to walk in hospital room?: Total Help needed climbing 3-5 steps with a railing? : Total 6 Click Score: 10    End of Session   Activity Tolerance: Patient tolerated treatment well Patient left: in bed;with call bell/phone within reach Nurse Communication: Mobility status;Need for lift equipment PT Visit Diagnosis: Unsteadiness on feet (R26.81);Difficulty in walking, not elsewhere classified (R26.2)    Time: 1015-1040 PT Time Calculation (min) (ACUTE ONLY): 25 min   Charges:   PT Evaluation $PT Eval Moderate Complexity: 1 Mod PT Treatments $Therapeutic Activity: 8-22 mins PT General Charges $$ ACUTE PT VISIT: 1 Visit         Tamala Ser PT 12/02/2023  Acute Rehabilitation Services  Office [phone]

## 2023-12-02 NOTE — Plan of Care (Signed)
  Problem: Metabolic: Goal: Ability to maintain appropriate glucose levels will improve Outcome: Progressing   

## 2023-12-02 NOTE — Progress Notes (Signed)
     Subjective: First time meeting Joseph [name]. No acute events overnight. Some burning in penis. Mild hematuria development overnight.    Objective: Vital signs in last 24 hours: Temp:  [97.4 F (36.3 C)-98.3 F (36.8 C)] 97.7 F (36.5 C) (12/13 0611) Pulse Rate:  [70-83] 70 (12/13 0611) Resp:  [15-18] 16 (12/13 0611) BP: (117-160)/(77-90) 152/90 (12/13 0611) SpO2:  [86 %-96 %] 95 % (12/13 8295)  Assessment/Plan: #emphysematous cystitis Day 6 of foley. Scheduled to discharge tomorrow.  TOV today.  Klebsiella UTI, only resistant to Ampicillin. Transition to Cefadroxil, 2 weeks total with close follow up in clinic.  Aggressive bowel regimen. No BM in over a week. Ok to discharge from a urological perspective once he was passed his voiding trial.   Intake/Output from previous day: 12/12 0701 - 12/13 0700 In: 1200 [P.O.:1200] Out: 4050 [Urine:4050]  Intake/Output this shift: Total I/O In: 240 [P.O.:240] Out: 550 [Urine:550]  Physical Exam:  General: Alert and oriented CV: No cyanosis Lungs: equal chest rise Abdomen: Soft, NTND, no rebound or guarding Gu: foley catheter in place draining lightly blood tinged urine.   Lab Results: Recent Labs    12/01/23 0445 12/02/23 0436  HGB 12.3* 12.0*  HCT 39.2 39.2   BMET Recent Labs    12/01/23 0445 12/02/23 0436  NA 139 138  K 5.0 4.6  CL 104 103  CO2 28 29  GLUCOSE 125* 154*  BUN 32* 32*  CREATININE 1.86* 1.67*  CALCIUM 8.5* 8.4*     Studies/Results: US SCROTUM W/DOPPLER Result Date: 11/30/2023 CLINICAL DATA:  Testicular pain EXAM: SCROTAL ULTRASOUND DOPPLER ULTRASOUND OF THE TESTICLES TECHNIQUE: Complete ultrasound examination of the testicles, epididymis, and other scrotal structures was performed. Color and spectral Doppler ultrasound were also utilized to evaluate blood flow to the testicles. COMPARISON:  11/26/2023 FINDINGS: Right testicle Measurements: 3.4 x 5.1 x 3.0 cm. No mass or microlithiasis  visualized. Left testicle Measurements: 3.3 x 5.3 x 2.6 cm. No mass or microlithiasis visualized. Right epididymis:  Normal in size and appearance. Left epididymis:  Normal in size and appearance. Hydrocele:  Trace left hydrocele. Varicocele:  None visualized. Pulsed Doppler interrogation of both testes demonstrates normal low resistance arterial and venous waveforms bilaterally. Other: Mild scrotal wall edema. IMPRESSION: 1. Unremarkable appearance of the testes. No evidence of testicular torsion. 2. Scrotal wall edema and trace left hydrocele. Electronically Signed   By: Sharlet Salina M.D.   On: 11/30/2023 19:50      LOS: 5 days   Elmon Kirschner, NP [facility] Pager: [phone]  12/02/2023, 1:06 PM

## 2023-12-03 DIAGNOSIS — N3080 Other cystitis without hematuria: Secondary | ICD-10-CM | POA: Diagnosis not present

## 2023-12-03 LAB — BASIC METABOLIC PANEL
Anion gap: 6 (ref 5–15)
BUN: 35 mg/dL — ABNORMAL HIGH (ref 6–20)
CO2: 27 mmol/L (ref 22–32)
Calcium: 8.7 mg/dL — ABNORMAL LOW (ref 8.9–10.3)
Chloride: 103 mmol/L (ref 98–111)
Creatinine, Ser: 1.55 mg/dL — ABNORMAL HIGH (ref 0.61–1.24)
GFR, Estimated: 54 mL/min — ABNORMAL LOW (ref >60)
Glucose, Bld: 176 mg/dL — ABNORMAL HIGH (ref 70–99)
Potassium: 4.4 mmol/L (ref 3.5–5.1)
Sodium: 136 mmol/L (ref 135–145)

## 2023-12-03 LAB — CBC
HCT: 41.6 % (ref 39.0–52.0)
Hemoglobin: 12.8 g/dL — ABNORMAL LOW (ref 13.0–17.0)
MCH: 26.6 pg (ref 26.0–34.0)
MCHC: 30.8 g/dL (ref 30.0–36.0)
MCV: 86.3 fL (ref 80.0–100.0)
Platelets: 297 10*3/uL (ref 150–400)
RBC: 4.82 MIL/uL (ref 4.22–5.81)
RDW: 14.7 % (ref 11.5–15.5)
WBC: 8.4 10*3/uL (ref 4.0–10.5)
nRBC: 0 % (ref 0.0–0.2)

## 2023-12-03 LAB — MAGNESIUM: Magnesium: 1.9 mg/dL (ref 1.7–2.4)

## 2023-12-03 LAB — GLUCOSE, CAPILLARY
Glucose-Capillary: 160 mg/dL — ABNORMAL HIGH (ref 70–99)
Glucose-Capillary: 230 mg/dL — ABNORMAL HIGH (ref 70–99)

## 2023-12-03 MED ORDER — KETOTIFEN FUMARATE 0.035 % OP SOLN: 1.0000 [drp] | Freq: Two times a day (BID) | OPHTHALMIC | 0 refills | Status: AC

## 2023-12-03 MED ORDER — OXYCODONE HCL 15 MG PO TABS: 15.0000 mg | ORAL_TABLET | ORAL | 0 refills | Status: DC | PRN

## 2023-12-03 MED ORDER — TAMSULOSIN HCL 0.4 MG PO CAPS: 0.4000 mg | ORAL_CAPSULE | Freq: Every day | ORAL | 0 refills | Status: DC

## 2023-12-03 MED ORDER — CEFADROXIL 500 MG PO CAPS: 1000.0000 mg | ORAL_CAPSULE | Freq: Two times a day (BID) | ORAL | 0 refills | Status: AC

## 2023-12-03 MED ORDER — DOCUSATE SODIUM 100 MG PO CAPS: 100.0000 mg | ORAL_CAPSULE | Freq: Two times a day (BID) | ORAL | 2 refills | Status: DC

## 2023-12-03 MED ORDER — POLYETHYLENE GLYCOL 3350 17 G PO PACK: 17.0000 g | PACK | Freq: Every day | ORAL | 0 refills | Status: DC

## 2023-12-03 NOTE — Progress Notes (Signed)
AVS reviewed w/ pt  who verbalized an understanding. Pt is currently living w/ his uncle & wanted to be sure O2 could still be used at his uncles home due to current bedroom size. This RN reassured the pt the O2 company would be able to facilitate the process. No other questions at this time. Pt waiting on his son to bring clothes. No IV present - pt stated it came out earlier.

## 2023-12-03 NOTE — TOC Transition Note (Signed)
Transition of Care [facility]) - Discharge Note   Patient Details  Name: Joseph [name] MRN: [id number] Date of Birth: 1971/04/22  [facility]) CM/SW Contact:  Adrian Prows, RN Phone Number: 12/03/2023, 12:57 PM   Clinical Narrative:    D/C orders received; overnight saturation note emailed to Mickeal Needy at [employer]; pt confirmed d/c address: [address] [address]; he can be contacted at [phone]; his wife Herbert Seta can be contacted at [phone]; oxygen will be delivered to pt's home; referral also placed for OPPT; pt notified to arrange w/  PCP; no TOC needs.   Final next level of care: Home/Self Care Barriers to Discharge: No Barriers Identified   Patient Goals and CMS Choice            Discharge Placement                       Discharge Plan and Services Additional resources added to the After Visit Summary for                  DME Arranged: Oxygen DME Agency: [employer] Date DME Agency Contacted: 12/03/23 Time DME Agency Contacted: [phone] Representative spoke with at DME Agency: Mickeal Needy            Social Drivers of Health (SDOH) Interventions SDOH Screenings   Food Insecurity: No Food Insecurity (11/26/2023)  Housing: Low Risk  (12/01/2023)  Transportation Needs: No Transportation Needs (11/29/2023)  Recent Concern: Transportation Needs - Unmet Transportation Needs (11/26/2023)  Utilities: Not At Risk (11/26/2023)  Depression (PHQ2-9): Low Risk  (05/06/2021)  Financial Resource Strain: Medium Risk (09/09/2022)  Social Connections: Unknown (10/03/2023)   Received from [facility]  Stress: No Stress Concern Present (10/10/2023)   Received from [facility]  Tobacco Use: Medium Risk (11/26/2023)     Readmission Risk Interventions    05/03/2023   11:58 AM 11/24/2021    4:05 PM 08/20/2021   12:26 PM  Readmission Risk Prevention Plan  Transportation Screening Complete Complete Complete  PCP or Specialist Appt within 3-5 Days  Complete    HRI or Home Care Consult Complete    Social Work Consult for Recovery Care Planning/Counseling Complete    Palliative Care Screening Not Applicable    Medication Review Oceanographer) Referral to Pharmacy Complete Complete  PCP or Specialist appointment within 3-5 days of discharge  Complete Complete  HRI or Home Care Consult  Complete Complete  SW Recovery Care/Counseling Consult  Complete Complete  Palliative Care Screening  Not Applicable Not Applicable  Skilled Nursing Facility  Complete Not Applicable

## 2023-12-03 NOTE — Plan of Care (Signed)
  Problem: Coping: Goal: Ability to adjust to condition or change in health will improve Outcome: Progressing   Problem: Skin Integrity: Goal: Risk for impaired skin integrity will decrease Outcome: Progressing   

## 2023-12-03 NOTE — Progress Notes (Signed)
Patient ID: Joseph [name], male   DOB: 10/28/1971, 52 y.o.   MRN: [id number]  His foley was removed yesterday and he is voiding slightly bloody urine.

## 2023-12-03 NOTE — Evaluation (Signed)
Occupational Therapy Evaluation Patient Details Name: Joseph [name] MRN: [id number] DOB: 02/18/1971 Today's Date: 12/03/2023   History of Present Illness 52 year old man who presented with lower abdominal pain and hematuria for several weeks.  Admitted for acute emphysematous cystitis. Hospitalization prolonged by hyperkalemia      PMH including COPD, CABG, diabetes, B BKA, MI, PE, DM, DVT, morbid obesity, peripheral nerve disease.   Clinical Impression   Pt admitted with the above. Pt do DC homes this day.  OT education complete    If plan is discharge home, recommend the following: A little help with bathing/dressing/bathroom;A lot of help with walking and/or transfers    Functional Status Assessment  Patient has had a recent decline in their functional status and demonstrates the ability to make significant improvements in function in a reasonable and predictable amount of time.        Precautions / Restrictions Precautions Precautions: Fall Precaution Comments: B BKA; multiple falls in early November related to prosthetics not fitting properly Restrictions Weight Bearing Restrictions Per Provider Order: No      Mobility Bed Mobility Overal bed mobility: Modified Independent             General bed mobility comments: HOB up, used rail; pt sat EOB , no loss of balance, BLEs and BUEs unsupported    Transfers                   General transfer comment: NT- pt does not have prostheses here      Balance Overall balance assessment: Needs assistance   Sitting balance-Leahy Scale: Good                                     ADL either performed or assessed with clinical judgement   ADL Overall ADL's : Needs assistance/impaired Eating/Feeding: Set up;Sitting   Grooming: Set up;Sitting   Upper Body Bathing: Set up;Sitting   Lower Body Bathing: Moderate assistance;Sitting/lateral leans   Upper Body Dressing : Set up   Lower Body Dressing:  Moderate assistance;Sitting/lateral leans     Toilet Transfer Details (indicate cue type and reason): did not perform.  verbalized safety and will use urinal often Toileting- Clothing Manipulation and Hygiene: Sitting/lateral lean;Moderate assistance     Tub/Shower Transfer Details (indicate cue type and reason): verbalized his technique for getting in the tub - pt walks on his BLE and somewhat crawls into tub .  Discussed options and pt states this is best for him         Vision Patient Visual Report: No change from baseline              Pertinent Vitals/Pain Pain Assessment Pain Score: 0-No pain     Extremity/Trunk Assessment Upper Extremity Assessment RUE Deficits / Details: recent fall with R shoulder injury, imaging negative for fx, was being treated at outpt PT; B hands numb to light touch RUE Sensation: decreased light touch;history of peripheral neuropathy LUE Deficits / Details: B hands numb to light touch LUE Sensation: decreased light touch;history of peripheral neuropathy   Lower Extremity Assessment RLE Deficits / Details: a few small scabs noted distal/anterior residual limb, pt reports this is from walking directly on residual limb. Knee ext +4/5. RLE Sensation: WNL LLE Deficits / Details: knee ext +4/5 LLE Sensation: WNL   Cervical / Trunk Assessment Cervical / Trunk Assessment: Normal   Communication Communication Communication: No apparent  difficulties   Cognition Arousal: Alert Behavior During Therapy: WFL for tasks assessed/performed Overall Cognitive Status: Within Functional Limits for tasks assessed                                                  Home Living Family/patient expects to be discharged to:: Private residence Living Arrangements: Spouse/significant other;Children Available Help at Discharge: Family;Available 24 hours/day Type of Home: House Home Access: Ramped entrance     Home Layout: One level      Bathroom Shower/Tub: Chief Strategy Officer: Standard Bathroom Accessibility: No   Home Equipment: Wheelchair - power;Shower seat;Cane - single point;Wheelchair Financial trader (4 wheels)   Additional Comments: has B BKA protheses but they fit poorly due to swelling in stumps, pt had a recent appointment to be refitted at outpt PT but couldn't make that appointment due to bladder issues. Wife is going through cancer treatments so is limited in ability to assist. Son is 28 y.o. Aunt and uncle help some.      Prior Functioning/Environment Prior Level of Function : Needs assist             Mobility Comments: Has been transferring to motorized WC standing on B stumps or using poorly fitted prosthetics; uses motoized WC to go to nearby grocery store. When able to walk with protheses he uses a rollator. Stated he does do walk short distances in prostheses but mostly uses WC. Fall in November resulting in R shoulder pain, he was being treated for this at outpt PT. ADLs Comments: Pt reports scooting into bathroom on his bottom then "Standing" on residual B LE to pull himself up and into tub.                 OT Goals(Current goals can be found in the care plan section) Acute Rehab OT Goals Patient Stated Goal: pt to DC this day  OT Frequency:         AM-PAC OT "6 Clicks" Daily Activity     Outcome Measure Help from another person eating meals?: None Help from another person taking care of personal grooming?: None Help from another person toileting, which includes using toliet, bedpan, or urinal?: A Lot Help from another person bathing (including washing, rinsing, drying)?: A Lot Help from another person to put on and taking off regular upper body clothing?: None Help from another person to put on and taking off regular lower body clothing?: A Lot 6 Click Score: 18   End of Session Nurse Communication: Mobility status  Activity Tolerance: Patient tolerated treatment  well Patient left: in bed;with call bell/phone within reach                   Time: 1135-1150 OT Time Calculation (min): 15 min Charges:  OT General Charges $OT Visit: 1 Visit OT Evaluation $OT Eval Low Complexity: 1 Low    Aneya Daddona D 12/03/2023, 1:19 PM

## 2023-12-05 ENCOUNTER — Ambulatory Visit: Payer: Medicare HMO | Admitting: Physical Therapy

## 2023-12-05 ENCOUNTER — Other Ambulatory Visit: Payer: Self-pay | Admitting: Physician Assistant
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
## 2023-12-06 ENCOUNTER — Other Ambulatory Visit (HOSPITAL_COMMUNITY): Payer: Self-pay

## 2023-12-06 ENCOUNTER — Other Ambulatory Visit: Payer: Self-pay

## 2023-12-06 MED ORDER — ALPRAZOLAM 1 MG PO TABS
1.0000 mg | ORAL_TABLET | Freq: Two times a day (BID) | ORAL | 0 refills | Status: DC | PRN
  Filled 2023-12-06: qty 60, 30d supply, fill #0

## 2023-12-06 MED ORDER — FLOVENT DISKUS 50 MCG/ACT IN AEPB
1.0000 | INHALATION_SPRAY | Freq: Two times a day (BID) | RESPIRATORY_TRACT | 0 refills | Status: DC
  Filled 2023-12-06: qty 60, 30d supply, fill #0

## 2023-12-06 MED ORDER — DAPAGLIFLOZIN PROPANEDIOL 10 MG PO TABS: 10.0000 mg | ORAL_TABLET | Freq: Every day | ORAL | 4 refills | Status: DC

## 2023-12-06 MED ORDER — OXYCODONE HCL 15 MG PO TABS
15.0000 mg | ORAL_TABLET | Freq: Every day | ORAL | 0 refills | Status: DC | PRN
  Filled 2023-12-06: qty 150, 30d supply, fill #0

## 2023-12-07 ENCOUNTER — Ambulatory Visit: Payer: Medicare HMO | Admitting: Physical Therapy

## 2023-12-12 ENCOUNTER — Ambulatory Visit: Payer: Medicare HMO | Admitting: Physical Therapy

## 2023-12-16 ENCOUNTER — Ambulatory Visit: Payer: Medicare HMO | Admitting: Physical Therapy

## 2023-12-19 ENCOUNTER — Ambulatory Visit: Payer: Medicare HMO | Admitting: Physical Therapy

## 2023-12-23 ENCOUNTER — Ambulatory Visit: Payer: Medicare HMO | Admitting: Physical Therapy

## 2023-12-28 ENCOUNTER — Other Ambulatory Visit: Payer: Self-pay | Admitting: Physician Assistant

## 2023-12-28 ENCOUNTER — Encounter: Payer: Self-pay | Admitting: Gastroenterology

## 2023-12-29 ENCOUNTER — Other Ambulatory Visit (HOSPITAL_COMMUNITY): Payer: Self-pay

## 2024-01-02 ENCOUNTER — Other Ambulatory Visit (HOSPITAL_COMMUNITY): Payer: Self-pay

## 2024-01-02 ENCOUNTER — Other Ambulatory Visit: Payer: Self-pay

## 2024-01-03 ENCOUNTER — Other Ambulatory Visit (HOSPITAL_COMMUNITY): Payer: Self-pay

## 2024-01-03 MED ORDER — OXYCODONE HCL 15 MG PO TABS
15.0000 mg | ORAL_TABLET | Freq: Four times a day (QID) | ORAL | 0 refills | Status: DC | PRN
  Filled 2024-01-03: qty 120, 30d supply, fill #0

## 2024-01-07 ENCOUNTER — Emergency Department (HOSPITAL_COMMUNITY): Payer: 59

## 2024-01-07 ENCOUNTER — Inpatient Hospital Stay (HOSPITAL_COMMUNITY)
Admission: EM | Admit: 2024-01-07 | Discharge: 2024-01-16 | DRG: 565 | Disposition: A | Discharge status: Home / Self Care | Payer: 59 | Attending: Internal Medicine | Admitting: Internal Medicine

## 2024-01-07 ENCOUNTER — Other Ambulatory Visit: Payer: Self-pay

## 2024-01-07 ENCOUNTER — Encounter (HOSPITAL_COMMUNITY): Payer: Self-pay

## 2024-01-07 DIAGNOSIS — Z955 Presence of coronary angioplasty implant and graft: Secondary | ICD-10-CM

## 2024-01-07 DIAGNOSIS — T8744 Infection of amputation stump, left lower extremity: Secondary | ICD-10-CM | POA: Diagnosis not present

## 2024-01-07 DIAGNOSIS — N401 Enlarged prostate with lower urinary tract symptoms: Secondary | ICD-10-CM | POA: Diagnosis present

## 2024-01-07 DIAGNOSIS — I5032 Chronic diastolic (congestive) heart failure: Secondary | ICD-10-CM

## 2024-01-07 DIAGNOSIS — E662 Morbid (severe) obesity with alveolar hypoventilation: Secondary | ICD-10-CM | POA: Diagnosis present

## 2024-01-07 DIAGNOSIS — Z7982 Long term (current) use of aspirin: Secondary | ICD-10-CM

## 2024-01-07 DIAGNOSIS — Z8249 Family history of ischemic heart disease and other diseases of the circulatory system: Secondary | ICD-10-CM

## 2024-01-07 DIAGNOSIS — F32A Depression, unspecified: Secondary | ICD-10-CM

## 2024-01-07 DIAGNOSIS — F418 Other specified anxiety disorders: Secondary | ICD-10-CM | POA: Diagnosis present

## 2024-01-07 DIAGNOSIS — Z7951 Long term (current) use of inhaled steroids: Secondary | ICD-10-CM

## 2024-01-07 DIAGNOSIS — I2510 Atherosclerotic heart disease of native coronary artery without angina pectoris: Secondary | ICD-10-CM | POA: Diagnosis present

## 2024-01-07 DIAGNOSIS — Z89512 Acquired absence of left leg below knee: Secondary | ICD-10-CM

## 2024-01-07 DIAGNOSIS — I5030 Unspecified diastolic (congestive) heart failure: Secondary | ICD-10-CM | POA: Diagnosis present

## 2024-01-07 DIAGNOSIS — I252 Old myocardial infarction: Secondary | ICD-10-CM

## 2024-01-07 DIAGNOSIS — Z89511 Acquired absence of right leg below knee: Secondary | ICD-10-CM

## 2024-01-07 DIAGNOSIS — Z560 Unemployment, unspecified: Secondary | ICD-10-CM

## 2024-01-07 DIAGNOSIS — I5033 Acute on chronic diastolic (congestive) heart failure: Secondary | ICD-10-CM | POA: Diagnosis present

## 2024-01-07 DIAGNOSIS — D72829 Elevated white blood cell count, unspecified: Secondary | ICD-10-CM | POA: Diagnosis present

## 2024-01-07 DIAGNOSIS — N138 Other obstructive and reflux uropathy: Secondary | ICD-10-CM | POA: Diagnosis present

## 2024-01-07 DIAGNOSIS — I130 Hypertensive heart and chronic kidney disease with heart failure and stage 1 through stage 4 chronic kidney disease, or unspecified chronic kidney disease: Secondary | ICD-10-CM | POA: Diagnosis present

## 2024-01-07 DIAGNOSIS — N1832 Chronic kidney disease, stage 3b: Secondary | ICD-10-CM | POA: Diagnosis present

## 2024-01-07 DIAGNOSIS — J4489 Other specified chronic obstructive pulmonary disease: Secondary | ICD-10-CM | POA: Diagnosis present

## 2024-01-07 DIAGNOSIS — J449 Chronic obstructive pulmonary disease, unspecified: Secondary | ICD-10-CM | POA: Diagnosis not present

## 2024-01-07 DIAGNOSIS — Z7902 Long term (current) use of antithrombotics/antiplatelets: Secondary | ICD-10-CM

## 2024-01-07 DIAGNOSIS — L03116 Cellulitis of left lower limb: Secondary | ICD-10-CM | POA: Diagnosis present

## 2024-01-07 DIAGNOSIS — G8929 Other chronic pain: Secondary | ICD-10-CM | POA: Diagnosis present

## 2024-01-07 DIAGNOSIS — M549 Dorsalgia, unspecified: Secondary | ICD-10-CM

## 2024-01-07 DIAGNOSIS — Z79899 Other long term (current) drug therapy: Secondary | ICD-10-CM

## 2024-01-07 DIAGNOSIS — N179 Acute kidney failure, unspecified: Secondary | ICD-10-CM | POA: Diagnosis not present

## 2024-01-07 DIAGNOSIS — E875 Hyperkalemia: Secondary | ICD-10-CM | POA: Diagnosis present

## 2024-01-07 DIAGNOSIS — Z91119 Patient's noncompliance with dietary regimen due to unspecified reason: Secondary | ICD-10-CM

## 2024-01-07 DIAGNOSIS — Z882 Allergy status to sulfonamides status: Secondary | ICD-10-CM

## 2024-01-07 DIAGNOSIS — I10 Essential (primary) hypertension: Secondary | ICD-10-CM | POA: Diagnosis present

## 2024-01-07 DIAGNOSIS — E119 Type 2 diabetes mellitus without complications: Secondary | ICD-10-CM

## 2024-01-07 DIAGNOSIS — E785 Hyperlipidemia, unspecified: Secondary | ICD-10-CM | POA: Diagnosis present

## 2024-01-07 DIAGNOSIS — Z5982 Transportation insecurity: Secondary | ICD-10-CM

## 2024-01-07 DIAGNOSIS — F419 Anxiety disorder, unspecified: Secondary | ICD-10-CM | POA: Diagnosis not present

## 2024-01-07 DIAGNOSIS — K219 Gastro-esophageal reflux disease without esophagitis: Secondary | ICD-10-CM | POA: Diagnosis present

## 2024-01-07 DIAGNOSIS — Z794 Long term (current) use of insulin: Secondary | ICD-10-CM

## 2024-01-07 DIAGNOSIS — Z23 Encounter for immunization: Secondary | ICD-10-CM

## 2024-01-07 DIAGNOSIS — Z7901 Long term (current) use of anticoagulants: Secondary | ICD-10-CM

## 2024-01-07 DIAGNOSIS — Z7984 Long term (current) use of oral hypoglycemic drugs: Secondary | ICD-10-CM

## 2024-01-07 DIAGNOSIS — Y835 Amputation of limb(s) as the cause of abnormal reaction of the patient, or of later complication, without mention of misadventure at the time of the procedure: Secondary | ICD-10-CM | POA: Diagnosis present

## 2024-01-07 DIAGNOSIS — Z86711 Personal history of pulmonary embolism: Secondary | ICD-10-CM | POA: Diagnosis present

## 2024-01-07 DIAGNOSIS — N1831 Chronic kidney disease, stage 3a: Secondary | ICD-10-CM

## 2024-01-07 DIAGNOSIS — Z6841 Body Mass Index (BMI) 40.0 and over, adult: Secondary | ICD-10-CM

## 2024-01-07 DIAGNOSIS — E1122 Type 2 diabetes mellitus with diabetic chronic kidney disease: Secondary | ICD-10-CM | POA: Diagnosis present

## 2024-01-07 DIAGNOSIS — Z5986 Financial insecurity: Secondary | ICD-10-CM

## 2024-01-07 DIAGNOSIS — E66813 Obesity, class 3: Secondary | ICD-10-CM | POA: Diagnosis present

## 2024-01-07 DIAGNOSIS — Z833 Family history of diabetes mellitus: Secondary | ICD-10-CM

## 2024-01-07 DIAGNOSIS — R338 Other retention of urine: Secondary | ICD-10-CM

## 2024-01-07 LAB — CBC
HCT: 37.1 % — ABNORMAL LOW (ref 39.0–52.0)
HCT: 34.8 % — ABNORMAL LOW (ref 39.0–52.0)
Hemoglobin: 11.7 g/dL — ABNORMAL LOW (ref 13.0–17.0)
Hemoglobin: 10.9 g/dL — ABNORMAL LOW (ref 13.0–17.0)
MCH: 26.6 pg (ref 26.0–34.0)
MCH: 26.7 pg (ref 26.0–34.0)
MCHC: 31.5 g/dL (ref 30.0–36.0)
MCHC: 31.3 g/dL (ref 30.0–36.0)
MCV: 84.3 fL (ref 80.0–100.0)
MCV: 85.3 fL (ref 80.0–100.0)
Platelets: 240 10*3/uL (ref 150–400)
Platelets: 226 10*3/uL (ref 150–400)
RBC: 4.4 MIL/uL (ref 4.22–5.81)
RBC: 4.08 MIL/uL — ABNORMAL LOW (ref 4.22–5.81)
RDW: 14.5 % (ref 11.5–15.5)
RDW: 14.6 % (ref 11.5–15.5)
WBC: 18.6 10*3/uL — ABNORMAL HIGH (ref 4.0–10.5)
WBC: 16.8 10*3/uL — ABNORMAL HIGH (ref 4.0–10.5)
nRBC: 0 % (ref 0.0–0.2)
nRBC: 0 % (ref 0.0–0.2)

## 2024-01-07 LAB — BASIC METABOLIC PANEL
Anion gap: 12 (ref 5–15)
Anion gap: 10 (ref 5–15)
BUN: 43 mg/dL — ABNORMAL HIGH (ref 6–20)
BUN: 43 mg/dL — ABNORMAL HIGH (ref 6–20)
CO2: 19 mmol/L — ABNORMAL LOW (ref 22–32)
CO2: 20 mmol/L — ABNORMAL LOW (ref 22–32)
Calcium: 7.9 mg/dL — ABNORMAL LOW (ref 8.9–10.3)
Calcium: 7.6 mg/dL — ABNORMAL LOW (ref 8.9–10.3)
Chloride: 103 mmol/L (ref 98–111)
Chloride: 102 mmol/L (ref 98–111)
Creatinine, Ser: 3.36 mg/dL — ABNORMAL HIGH (ref 0.61–1.24)
Creatinine, Ser: 3.29 mg/dL — ABNORMAL HIGH (ref 0.61–1.24)
GFR, Estimated: 21 mL/min — ABNORMAL LOW (ref >60)
GFR, Estimated: 22 mL/min — ABNORMAL LOW (ref >60)
Glucose, Bld: 319 mg/dL — ABNORMAL HIGH (ref 70–99)
Glucose, Bld: 276 mg/dL — ABNORMAL HIGH (ref 70–99)
Potassium: 4.5 mmol/L (ref 3.5–5.1)
Potassium: 4 mmol/L (ref 3.5–5.1)
Sodium: 134 mmol/L — ABNORMAL LOW (ref 135–145)
Sodium: 132 mmol/L — ABNORMAL LOW (ref 135–145)

## 2024-01-07 LAB — BLOOD GAS, VENOUS
Acid-base deficit: 3.9 mmol/L — ABNORMAL HIGH (ref 0.0–2.0)
Bicarbonate: 22.2 mmol/L (ref 20.0–28.0)
O2 Saturation: 82.1 %
Patient temperature: 37
pCO2, Ven: 43 mm[Hg] — ABNORMAL LOW (ref 44–60)
pH, Ven: 7.32 (ref 7.25–7.43)
pO2, Ven: 45 mm[Hg] (ref 32–45)

## 2024-01-07 LAB — URINALYSIS, COMPLETE (UACMP) WITH MICROSCOPIC
Bacteria, UA: NONE SEEN
Bilirubin Urine: NEGATIVE
Glucose, UA: >=500 mg/dL — AB
Ketones, ur: NEGATIVE mg/dL
Leukocytes,Ua: NEGATIVE
Nitrite: NEGATIVE
Protein, ur: >=300 mg/dL — AB
Specific Gravity, Urine: 1.014 (ref 1.005–1.030)
pH: 5 (ref 5.0–8.0)

## 2024-01-07 LAB — RESP PANEL BY RT-PCR (RSV, FLU A&B, COVID)  RVPGX2
Influenza A by PCR: NEGATIVE
Influenza B by PCR: NEGATIVE
Resp Syncytial Virus by PCR: NEGATIVE
SARS Coronavirus 2 by RT PCR: NEGATIVE

## 2024-01-07 LAB — TROPONIN I (HIGH SENSITIVITY)
Troponin I (High Sensitivity): 21 ng/L — ABNORMAL HIGH (ref <18)
Troponin I (High Sensitivity): 17 ng/L (ref <18)

## 2024-01-07 LAB — CBG MONITORING, ED
Glucose-Capillary: 243 mg/dL — ABNORMAL HIGH (ref 70–99)
Glucose-Capillary: 235 mg/dL — ABNORMAL HIGH (ref 70–99)

## 2024-01-07 LAB — GLUCOSE, CAPILLARY
Glucose-Capillary: 226 mg/dL — ABNORMAL HIGH (ref 70–99)
Glucose-Capillary: 219 mg/dL — ABNORMAL HIGH (ref 70–99)
Glucose-Capillary: 172 mg/dL — ABNORMAL HIGH (ref 70–99)

## 2024-01-07 LAB — HEMOGLOBIN A1C
Hgb A1c MFr Bld: 8.1 % — ABNORMAL HIGH (ref 4.8–5.6)
Mean Plasma Glucose: 185.77 mg/dL

## 2024-01-07 LAB — BRAIN NATRIURETIC PEPTIDE: B Natriuretic Peptide: 347.2 pg/mL — ABNORMAL HIGH (ref 0.0–100.0)

## 2024-01-07 MED ORDER — ALBUTEROL SULFATE (2.5 MG/3ML) 0.083% IN NEBU: 2.5000 mg | INHALATION_SOLUTION | Freq: Four times a day (QID) | RESPIRATORY_TRACT | Status: DC | PRN

## 2024-01-07 MED ORDER — ESCITALOPRAM OXALATE 10 MG PO TABS
10.0000 mg | ORAL_TABLET | Freq: Every day | ORAL | Status: DC
Start: 2024-01-07 — End: 2024-01-16
  Administered 2024-01-07 – 2024-01-16 (×10): 10 mg via ORAL
  Filled 2024-01-07 (×10): qty 1

## 2024-01-07 MED ORDER — ICOSAPENT ETHYL 1 G PO CAPS
2.0000 g | ORAL_CAPSULE | Freq: Two times a day (BID) | ORAL | Status: DC
  Administered 2024-01-07 – 2024-01-16 (×18): 2 g via ORAL
  Filled 2024-01-07 (×20): qty 2

## 2024-01-07 MED ORDER — FLUTICASONE PROPIONATE 50 MCG/ACT NA SUSP
2.0000 | Freq: Every day | NASAL | Status: DC
  Administered 2024-01-08 – 2024-01-16 (×7): 2 via NASAL
  Filled 2024-01-07 (×2): qty 16

## 2024-01-07 MED ORDER — OXYCODONE HCL 5 MG PO TABS
15.0000 mg | ORAL_TABLET | Freq: Once | ORAL | Status: AC
  Administered 2024-01-07: 15 mg via ORAL
  Filled 2024-01-07: qty 3

## 2024-01-07 MED ORDER — ASPIRIN 81 MG PO TBEC
81.0000 mg | DELAYED_RELEASE_TABLET | Freq: Every day | ORAL | Status: DC
  Administered 2024-01-07 – 2024-01-16 (×10): 81 mg via ORAL
  Filled 2024-01-07 (×10): qty 1

## 2024-01-07 MED ORDER — TIZANIDINE HCL 4 MG PO TABS
4.0000 mg | ORAL_TABLET | Freq: Three times a day (TID) | ORAL | Status: DC | PRN
  Administered 2024-01-10 – 2024-01-13 (×3): 4 mg via ORAL
  Filled 2024-01-07 (×3): qty 1

## 2024-01-07 MED ORDER — ONDANSETRON HCL 4 MG/2ML IJ SOLN: 4.0000 mg | Freq: Four times a day (QID) | INTRAMUSCULAR | Status: DC | PRN

## 2024-01-07 MED ORDER — PREGABALIN 75 MG PO CAPS
75.0000 mg | ORAL_CAPSULE | Freq: Two times a day (BID) | ORAL | Status: DC
Start: 2024-01-07 — End: 2024-01-16
  Administered 2024-01-08 – 2024-01-16 (×17): 75 mg via ORAL
  Filled 2024-01-07 (×18): qty 1

## 2024-01-07 MED ORDER — SODIUM CHLORIDE 0.9 % IV BOLUS
1000.0000 mL | Freq: Once | INTRAVENOUS | Status: AC
  Administered 2024-01-07: 1000 mL via INTRAVENOUS

## 2024-01-07 MED ORDER — PREGABALIN 25 MG PO CAPS
150.0000 mg | ORAL_CAPSULE | Freq: Two times a day (BID) | ORAL | Status: DC
  Filled 2024-01-07: qty 2

## 2024-01-07 MED ORDER — PNEUMOCOCCAL 20-VAL CONJ VACC 0.5 ML IM SUSY
0.5000 mL | PREFILLED_SYRINGE | INTRAMUSCULAR | Status: AC
  Administered 2024-01-08: 0.5 mL via INTRAMUSCULAR
  Filled 2024-01-07: qty 0.5

## 2024-01-07 MED ORDER — INSULIN ASPART 100 UNIT/ML IJ SOLN
0.0000 [IU] | Freq: Three times a day (TID) | INTRAMUSCULAR | Status: DC
  Administered 2024-01-07 – 2024-01-08 (×4): 5 [IU] via SUBCUTANEOUS
  Administered 2024-01-08: 8 [IU] via SUBCUTANEOUS
  Administered 2024-01-08: 5 [IU] via SUBCUTANEOUS
  Administered 2024-01-09: 8 [IU] via SUBCUTANEOUS
  Administered 2024-01-09: 3 [IU] via SUBCUTANEOUS
  Administered 2024-01-09: 11 [IU] via SUBCUTANEOUS
  Administered 2024-01-10: 5 [IU] via SUBCUTANEOUS
  Administered 2024-01-10 – 2024-01-11 (×4): 8 [IU] via SUBCUTANEOUS
  Administered 2024-01-11: 5 [IU] via SUBCUTANEOUS
  Administered 2024-01-12: 11 [IU] via SUBCUTANEOUS
  Administered 2024-01-12: 5 [IU] via SUBCUTANEOUS
  Administered 2024-01-12: 3 [IU] via SUBCUTANEOUS
  Administered 2024-01-13: 8 [IU] via SUBCUTANEOUS
  Administered 2024-01-13: 5 [IU] via SUBCUTANEOUS
  Administered 2024-01-13: 3 [IU] via SUBCUTANEOUS
  Administered 2024-01-14: 2 [IU] via SUBCUTANEOUS
  Administered 2024-01-14 (×2): 5 [IU] via SUBCUTANEOUS
  Administered 2024-01-15: 11 [IU] via SUBCUTANEOUS
  Administered 2024-01-15: 15 [IU] via SUBCUTANEOUS
  Administered 2024-01-15 – 2024-01-16 (×2): 5 [IU] via SUBCUTANEOUS

## 2024-01-07 MED ORDER — ALPRAZOLAM 0.5 MG PO TABS
1.0000 mg | ORAL_TABLET | Freq: Two times a day (BID) | ORAL | Status: DC | PRN
  Administered 2024-01-11 – 2024-01-12 (×2): 1 mg via ORAL
  Filled 2024-01-07 (×2): qty 2

## 2024-01-07 MED ORDER — TAMSULOSIN HCL 0.4 MG PO CAPS
0.4000 mg | ORAL_CAPSULE | Freq: Every day | ORAL | Status: DC
Start: 2024-01-07 — End: 2024-01-16
  Administered 2024-01-07 – 2024-01-16 (×10): 0.4 mg via ORAL
  Filled 2024-01-07 (×10): qty 1

## 2024-01-07 MED ORDER — PANTOPRAZOLE SODIUM 40 MG PO TBEC
40.0000 mg | DELAYED_RELEASE_TABLET | Freq: Every day | ORAL | Status: DC
Start: 2024-01-07 — End: 2024-01-16
  Administered 2024-01-07 – 2024-01-16 (×10): 40 mg via ORAL
  Filled 2024-01-07 (×10): qty 1

## 2024-01-07 MED ORDER — LIDOCAINE 5 % EX PTCH
1.0000 | MEDICATED_PATCH | CUTANEOUS | Status: DC
  Administered 2024-01-07 – 2024-01-15 (×9): 1 via TRANSDERMAL
  Filled 2024-01-07 (×9): qty 1

## 2024-01-07 MED ORDER — INSULIN ASPART 100 UNIT/ML IJ SOLN
0.0000 [IU] | Freq: Every day | INTRAMUSCULAR | Status: DC
  Administered 2024-01-08: 3 [IU] via SUBCUTANEOUS
  Administered 2024-01-09 – 2024-01-11 (×3): 2 [IU] via SUBCUTANEOUS
  Administered 2024-01-12 – 2024-01-14 (×3): 3 [IU] via SUBCUTANEOUS
  Administered 2024-01-15: 4 [IU] via SUBCUTANEOUS

## 2024-01-07 MED ORDER — ENOXAPARIN SODIUM 100 MG/ML IJ SOSY
85.0000 mg | PREFILLED_SYRINGE | INTRAMUSCULAR | Status: DC
  Administered 2024-01-07: 85 mg via SUBCUTANEOUS
  Filled 2024-01-07 (×2): qty 0.85

## 2024-01-07 MED ORDER — KETOTIFEN FUMARATE 0.035 % OP SOLN
1.0000 [drp] | Freq: Two times a day (BID) | OPHTHALMIC | Status: DC
  Administered 2024-01-07 – 2024-01-16 (×17): 1 [drp] via OPHTHALMIC
  Filled 2024-01-07: qty 5

## 2024-01-07 MED ORDER — INFLUENZA VIRUS VACC SPLIT PF (FLUZONE) 0.5 ML IM SUSY
0.5000 mL | PREFILLED_SYRINGE | INTRAMUSCULAR | Status: DC
  Filled 2024-01-07: qty 0.5

## 2024-01-07 MED ORDER — SODIUM CHLORIDE 0.9 % IV SOLN: INTRAVENOUS | Status: DC

## 2024-01-07 MED ORDER — INSULIN GLARGINE-YFGN 100 UNIT/ML ~~LOC~~ SOLN
20.0000 [IU] | Freq: Two times a day (BID) | SUBCUTANEOUS | Status: DC
  Administered 2024-01-07 – 2024-01-09 (×5): 20 [IU] via SUBCUTANEOUS
  Filled 2024-01-07 (×7): qty 0.2

## 2024-01-07 MED ORDER — FLUCONAZOLE 100 MG PO TABS
100.0000 mg | ORAL_TABLET | ORAL | Status: DC
  Administered 2024-01-07 – 2024-01-14 (×2): 100 mg via ORAL
  Filled 2024-01-07 (×2): qty 1

## 2024-01-07 MED ORDER — CARVEDILOL 6.25 MG PO TABS
6.2500 mg | ORAL_TABLET | Freq: Two times a day (BID) | ORAL | Status: DC
  Administered 2024-01-07 – 2024-01-08 (×3): 6.25 mg via ORAL
  Filled 2024-01-07: qty 2
  Filled 2024-01-07 (×2): qty 1

## 2024-01-07 MED ORDER — BUPROPION HCL ER (XL) 150 MG PO TB24
150.0000 mg | ORAL_TABLET | Freq: Every day | ORAL | Status: DC
Start: 2024-01-07 — End: 2024-01-16
  Administered 2024-01-07 – 2024-01-16 (×10): 150 mg via ORAL
  Filled 2024-01-07 (×10): qty 1

## 2024-01-07 MED ORDER — LORATADINE 10 MG PO TABS
10.0000 mg | ORAL_TABLET | Freq: Every day | ORAL | Status: DC
  Administered 2024-01-07 – 2024-01-16 (×10): 10 mg via ORAL
  Filled 2024-01-07 (×10): qty 1

## 2024-01-07 MED ORDER — ACETAMINOPHEN 650 MG RE SUPP
650.0000 mg | Freq: Four times a day (QID) | RECTAL | Status: DC | PRN
  Filled 2024-01-07: qty 1

## 2024-01-07 MED ORDER — ACETAMINOPHEN 325 MG PO TABS
650.0000 mg | ORAL_TABLET | Freq: Four times a day (QID) | ORAL | Status: DC | PRN
  Administered 2024-01-07 – 2024-01-13 (×5): 650 mg via ORAL
  Filled 2024-01-07 (×5): qty 2

## 2024-01-07 MED ORDER — CLOPIDOGREL BISULFATE 75 MG PO TABS
75.0000 mg | ORAL_TABLET | Freq: Every day | ORAL | Status: DC
Start: 2024-01-07 — End: 2024-01-16
  Administered 2024-01-07 – 2024-01-16 (×10): 75 mg via ORAL
  Filled 2024-01-07 (×10): qty 1

## 2024-01-07 MED ORDER — ATORVASTATIN CALCIUM 80 MG PO TABS
80.0000 mg | ORAL_TABLET | Freq: Every day | ORAL | Status: DC
Start: 2024-01-07 — End: 2024-01-16
  Administered 2024-01-07 – 2024-01-16 (×10): 80 mg via ORAL
  Filled 2024-01-07 (×5): qty 1
  Filled 2024-01-07: qty 2
  Filled 2024-01-07 (×4): qty 1

## 2024-01-07 MED ORDER — ONDANSETRON HCL 4 MG PO TABS: 4.0000 mg | ORAL_TABLET | Freq: Four times a day (QID) | ORAL | Status: DC | PRN

## 2024-01-07 MED ORDER — OXYCODONE HCL 5 MG PO TABS
15.0000 mg | ORAL_TABLET | Freq: Four times a day (QID) | ORAL | Status: DC | PRN
  Administered 2024-01-07 – 2024-01-13 (×18): 15 mg via ORAL
  Filled 2024-01-07 (×19): qty 3

## 2024-01-07 MED ORDER — RANOLAZINE ER 500 MG PO TB12
500.0000 mg | ORAL_TABLET | Freq: Two times a day (BID) | ORAL | Status: DC
  Administered 2024-01-07 – 2024-01-16 (×19): 500 mg via ORAL
  Filled 2024-01-07 (×20): qty 1

## 2024-01-07 MED ORDER — HEPARIN SODIUM (PORCINE) 5000 UNIT/ML IJ SOLN
5000.0000 [IU] | Freq: Three times a day (TID) | INTRAMUSCULAR | Status: DC
  Administered 2024-01-08 – 2024-01-16 (×24): 5000 [IU] via SUBCUTANEOUS
  Filled 2024-01-07 (×24): qty 1

## 2024-01-07 NOTE — Assessment & Plan Note (Addendum)
On admission, Continue oxy 15mg  Q6H PRN (verified dosing with [place]). Reduce Lyrica to 150mg  BID instead of TID for the moment due to AKI. 01-07-2024 pt with slow cadence to his speech. Will reduce lyrica even further to 75 mg bid.  01-08-2024 pt's mentation has improved with drastic reduction in lyrica. Now at 75 mg bid(was at 150 mg tid).

## 2024-01-07 NOTE — Assessment & Plan Note (Addendum)
On admission, New Leukocytosis today, unclear cause or significance at this time. 01-07-2024 covid/flu/rsv negative. UA negative for UTI. Continue to monitor.  01-08-2024 febrile yesterday. Blood cx X 2. Start IV rocephin 2 gram daily. Could be prostatitis.

## 2024-01-07 NOTE — Subjective & Objective (Signed)
Pt seen and examined. Pt being evasive on who writes Rx for his meds.  Get his pain meds from practice in [place](Erica Cox, NP) with [facility]. But does not get any primary care from this practice.  Then he states he gets his primary care from [facility] from Dr. Cain Saupe. I tried to call [facility] office but they are closed today.  Last known BUN/Scr was in Dec 2024 when pt was admitted to the hospital. DC BUN/Scr was 35/1.55  Renal U/S negative yesterday Awaiting post-void residual bladder scan

## 2024-01-07 NOTE — Assessment & Plan Note (Addendum)
On admission, Patient would very likely benefit from [facility] treatment for his DM2 if theres any way at all we could get his insurance to cover it.  01-07-2024 BMI 72.27. class III obesity.

## 2024-01-07 NOTE — Progress Notes (Signed)
Rapid response nurse called to bedside to evaluation the pt for worsening LOC.The nurse recommended giving the pt the prn dose of tylenol and to call back if any negative changes occur.

## 2024-01-07 NOTE — Assessment & Plan Note (Signed)
01-07-2024 chronic.

## 2024-01-07 NOTE — Assessment & Plan Note (Addendum)
On admission, AKF likely driving acute illness and presentation today (suspect chronic hypercapnic failure that was previously compensated now decompensating due to AKI). Unclear why he has AKF today however. Not taking NSAIDS. Not taking ACE/ARB. Looks like he's no longer taking torsemide as of early December, not on any diuretics either. 01-07-2024 pt is urinating well. He had a full urinal at his bedside.UA shows >300 protein. Check spot urine protein:creatinine. Likely he has diabetic nephrology.  01-08-2024 AKI may have been due to bladder outlet obstruction from prostate. No hydronephrosis on renal U/S but pt had 483 ml in his bladder yesterday on post-void residual scan. Was also on IVF overnight. Will monitor Scr tomorrow without any running IVF and see how his Scr is doing. If continues to decrease/normalize, his AKI is due to bladder outlet obstruction from BPH. Will likely need to go home with foley and f/u with outpatient urology. Pt states he already had appointment to see urology next week. This had already been arranged prior to admission.

## 2024-01-07 NOTE — Assessment & Plan Note (Addendum)
On admission, Continue wellbutrin. Continue xanax 1mg  PO BID PRN. 01-07-2024 stable.  01-08-2024 stable.

## 2024-01-07 NOTE — Significant Event (Signed)
Rapid Response Event Note   Reason for Call :  Bedside nurse called due to alerted mental status, lethargic, barely answering questions, opening eyes then going right back to sleep.   Initial Focused Assessment:  When arrived to room patient is responsive but sleepy, answering questions, oriented to self and place, moves all extremities. Patient clammy and hot to touch. NIH 6 (bilateral lower extremity weakness- bilateral amputee)    BP- 118/79 HR- 100 RR- 14 A. Temp- 99.3  Interventions:  Blood sugar obtained:172 Tylenol given  Plan of Care:  MS consistent with previously documented notes. Treat fever and monitor mental status. Please call rapid response if further assistance needed.    Event Summary:   Call Time: 22:04 Arrival Time: 22:09 End Time: 22:20  Nechama Guard, RN

## 2024-01-07 NOTE — H&P (Signed)
History and Physical    Patient: Joseph [name]:[id number] DOB: 10-05-1971 DOA: 01/07/2024 DOS: the patient was seen and examined on 01/07/2024 PCP: Cain Saupe, MD  Patient coming from: Home  Chief Complaint:  Chief Complaint  Patient presents with   Chest Pain   HPI: Joseph [name] is a 53 y.o. male with medical history significant of Morbid obesity with BMI of > 70, DM2, HTN, dCHF, prior PE (looks like he was on xarelto previously but dont see this on med rec), COPD, BLE BKAs, CKD 3a.  Pt in to ED with c/o fatigue, intermittent CP and SOB over past couple of days.  Subjective fever.  Some swelling of L stump.  No cough.  No trauma.  Review of Systems: As mentioned in the history of present illness. All other systems reviewed and are negative. Past Medical History:  Diagnosis Date   Aneurysm of ascending aorta (HCC) 09/14/2021   Anxiety    Aortic atherosclerosis (HCC) 11/28/2023   Arthritis    "knees, shoulders, hips, ankles" (07/29/2016)   Asthma    Bicuspid aortic valve 09/14/2021   BMI 70 and over, adult (HCC) 02/16/2019   CAD (coronary artery disease)    a. 2017: s/p BMS to distal Cx; b. LHC 05/30/2019: 80% mid, distal RCA s/p DES, 30% narrowing of d LM, widely patent LAD w/ luminal irregularities, widely patent stent in dCX  w/ 90+% stenosis distal to stent beofre small trifurcating obtuse marginal (potentially area of restenosis)   Chronic bronchitis (HCC)    Chronic ulcer of great toe of right foot (HCC) 09/09/2017   Chronic ulcer of right great toe (HCC) 09/19/2017   COPD (chronic obstructive pulmonary disease) (HCC)    Coronary arteriosclerosis    Depression    Diabetes mellitus (HCC)    DVT (deep venous thrombosis) (HCC)    GERD (gastroesophageal reflux disease)    Hyperlipidemia    Hypertension    Migraine    Morbid obesity (HCC)    Morbid obesity with BMI of 70 and over, adult (HCC) 11/28/2023   Myocardial infarction (HCC) 1996   "light one"   PE  (pulmonary embolism) 04/2013   On chronic Xarelto   Peripheral nerve disease    Pneumonia "several times"   Sleep apnea    Type II diabetes mellitus (HCC)    Past Surgical History:  Procedure Laterality Date   ABDOMINAL AORTOGRAM W/LOWER EXTREMITY N/A 10/10/2020   Procedure: ABDOMINAL AORTOGRAM W/LOWER EXTREMITY;  Surgeon: Sherren Kerns, MD;  [facility]: [facility];  Service: Cardiovascular;  Laterality: N/A;   AMPUTATION Right 09/21/2017   Procedure: RIGHT GREAT TOE AMPUTATION, POSSIBLE VAC;  Surgeon: Tarry Kos, MD;  Location: [facility] OR;  Service: Orthopedics;  Laterality: Right;   AMPUTATION Left 08/13/2020   Procedure: LEFT FOOT 4TH RAY AMPUTATION;  Surgeon: [name], MD;  Location: [facility] OR;  Service: Orthopedics;  Laterality: Left;   AMPUTATION Left 11/20/2021   Procedure: LEFT TRANSMETATARSAL AMPUTATION;  Surgeon: [name], MD;  Location: [facility] OR;  Service: Orthopedics;  Laterality: Left;   AMPUTATION Left 01/08/2022   Procedure: LEFT BELOW KNEE AMPUTATION;  Surgeon: [name], MD;  Location: [facility] OR;  Service: Orthopedics;  Laterality: Left;   AMPUTATION Right 07/07/2022   Procedure: RIGHT BELOW KNEE AMPUTATION;  Surgeon: [name], MD;  Location: [facility] OR;  Service: Orthopedics;  Laterality: Right;   AORTOGRAM Bilateral 03/13/2021   Procedure: ABDOMINAL AORTOGRAM WITH Left LOWER EXTREMITY RUNOFF;  Surgeon: Leonie Douglas, MD;  Location: [facility] OR;  Service: Vascular;  Laterality: Bilateral;   APPLICATION OF WOUND VAC  01/08/2022   Procedure: APPLICATION OF WOUND VAC;  Surgeon: [name], MD;  Location: [facility] OR;  Service: Orthopedics;;   APPLICATION OF WOUND VAC Right 07/07/2022   Procedure: APPLICATION OF WOUND VAC;  Surgeon: [name], MD;  [facility]: [facility] OR;  Service: Orthopedics;  Laterality: Right;   CARDIAC CATHETERIZATION  2006   CARDIAC CATHETERIZATION  1996   "@ [facility]; when I had my heart attack"   CARDIAC CATHETERIZATION N/A 07/29/2016   Procedure: Left  Heart Cath and Coronary Angiography;  Surgeon: Runell Gess, MD;  Location: [facility] INVAS[facility] CV LAB;  Service: Cardiovascular;  Laterality: N/A;   CARDIAC CATHETERIZATION N/A 07/29/2016   Procedure: Coronary Stent Intervention;  Surgeon: Runell Gess, MD;  [facility]: [facility];  Service: Cardiovascular;  Laterality: N/A;   CARPAL TUNNEL RELEASE Bilateral    CORONARY ANGIOPLASTY WITH STENT PLACEMENT  07/29/2016   CORONARY ARTERY BYPASS GRAFT N/A 07/24/2020   Procedure: CORONARY ARTERY BYPASS GRAFTING (CABG), ON PUMP, TIMES FOUR, USING LEFT INTERNAL MAMMARY ARTERY AND ENDOSCOPICALLY HARVESTED RIGHT GREATER SAPHENOUS VEIN;  Surgeon: Corliss Skains, MD;  Location: [facility] OR;  Service: Open Heart Surgery;  Laterality: N/A;  FLOW TAC   CORONARY PRESSURE/FFR STUDY N/A 07/22/2020   Procedure: INTRAVASCULAR PRESSURE WIRE/FFR STUDY;  Surgeon: Marykay Lex, MD;  Location: [facility] INVASIVE CV LAB;  Service: Cardiovascular;  Laterality: N/A;   CORONARY STENT INTERVENTION N/A 05/30/2019   Procedure: CORONARY STENT INTERVENTION;  Surgeon: Lyn Records, MD;  [facility]: [facility];  Service: Cardiovascular;  Laterality: N/A;   CORONARY STENT INTERVENTION N/A 11/02/2019   Procedure: CORONARY STENT INTERVENTION;  Surgeon: Iran Ouch, MD;  [facility]: [facility];  Service: Cardiovascular;  Laterality: N/A;   ESOPHAGOGASTRODUODENOSCOPY N/A 09/22/2017   Procedure: ESOPHAGOGASTRODUODENOSCOPY (EGD);  Surgeon: Meryl Dare, MD;  Location: [facility] ENDOSCOPY;  Service: Endoscopy;  Laterality: N/A;   KNEE ARTHROSCOPY Bilateral    "2 on left; 1 on the right"   LEFT HEART CATH AND CORONARY ANGIOGRAPHY N/A 11/02/2019   Procedure: LEFT HEART CATH AND CORONARY ANGIOGRAPHY;  Surgeon: Iran Ouch, MD;  [facility];  Service: Cardiovascular;  Laterality: N/A;   LEFT HEART CATH AND CORONARY ANGIOGRAPHY N/A 07/22/2020   Procedure: LEFT HEART CATH AND CORONARY ANGIOGRAPHY;  Surgeon:  Marykay Lex, MD;  Location: [facility] INVASIVE CV LAB;  Service: Cardiovascular;  Laterality: N/A;   LEFT HEART CATH AND CORS/GRAFTS ANGIOGRAPHY N/A 08/17/2021   Procedure: LEFT HEART CATH AND CORS/GRAFTS ANGIOGRAPHY;  Surgeon: Yvonne Kendall, MD;  [facility]: [facility];  Service: Cardiovascular;  Laterality: N/A;   PERIPHERAL VASCULAR INTERVENTION Left 10/11/2020   popliteal and SFA stent placement    PERIPHERAL VASCULAR INTERVENTION Left 10/10/2020   Procedure: PERIPHERAL VASCULAR INTERVENTION;  Surgeon: Sherren Kerns, MD;  [facility];  Service: Cardiovascular;  Laterality: Left;   RIGHT/LEFT HEART CATH AND CORONARY ANGIOGRAPHY N/A 05/30/2019   Procedure: RIGHT/LEFT HEART CATH AND CORONARY ANGIOGRAPHY;  Surgeon: Lyn Records, MD;  [facility];  Service: Cardiovascular;  Laterality: N/A;   RIGHT/LEFT HEART CATH AND CORONARY/GRAFT ANGIOGRAPHY N/A 06/17/2022   Procedure: RIGHT/LEFT HEART CATH AND CORONARY/GRAFT ANGIOGRAPHY;  Surgeon: Lennette Bihari, MD;  [facility]: [facility];  Service: Cardiovascular;  Laterality: N/A;   SHOULDER OPEN ROTATOR  CUFF REPAIR Bilateral    TEE WITHOUT CARDIOVERSION N/A 07/24/2020   Procedure: TRANSESOPHAGEAL ECHOCARDIOGRAM (TEE);  Surgeon: Corliss Skains, MD;  Location: [facility] OR;  Service: Open Heart Surgery;  Laterality: N/A;   Social History:  reports that he quit smoking about 3 years ago. His smoking use included cigarettes. He started smoking about 37 years ago. He has a 34 pack-year smoking history. He has quit using smokeless tobacco.  His smokeless tobacco use included snuff and chew. He reports current alcohol use. He reports that he does not use drugs.  Allergies  Allergen Reactions   Sulfa Antibiotics Other (See Comments)    Headaches     Family History  Problem Relation Age of Onset   Hypertension Brother    Hypertension Father    Diabetes Other    Hyperlipidemia Other     Prior to Admission  medications   Medication Sig Start Date End Date Taking? Authorizing Provider  acetaminophen (TYLENOL) 500 MG tablet Take 1,000 mg by mouth 3 (three) times daily as needed for headache.    [provider]  albuterol (VENTOLIN HFA) 108 (90 Base) MCG/ACT inhaler Inhale 2 puffs into the lungs every 6 (six) hours as needed for shortness of breath.    [provider]  ALPRAZolam Prudy Feeler) 1 MG tablet Take 1 tablet (1 mg total) by mouth 2 (two) times daily as needed. 12/06/23     aspirin EC 81 MG tablet Take 81 mg by mouth in the morning. Swallow whole.    [provider]  atorvastatin (LIPITOR) 80 MG tablet TAKE ONE TABLET (80 MG TOTAL) BY MOUTH DAILY AT 6PM APPOINTMENT OVERDUE: NEEDS APPOINTMENT FOR FURTHER REFILLS PLEASE CALL OFFICE Patient taking differently: Take 80 mg by mouth daily. 11/25/23   Tereso Newcomer T, PA-C  buPROPion (WELLBUTRIN XL) 150 MG 24 hr tablet Take 150 mg by mouth in the morning and at bedtime.    [provider]  carvedilol (COREG) 6.25 MG tablet TAKE ONE TABLET (6.25 MG TOTAL) BY MOUTH TWICE DAILY @9AM -5PM Patient taking differently: Take 6.25 mg by mouth 2 (two) times daily with a meal. 11/25/23   Weaver, Lorin Picket T, PA-C  clopidogrel (PLAVIX) 75 MG tablet TAKE ONE TABLET (75 MG TOTAL) BY MOUTH DAILY AT 9AM WITH BREAKFAST APPOINTMENT OVERDUE: NEEDS APPOINTMENT FOR FURTHER REFILLS, PLEASE CALL OFFICE Patient taking differently: Take 75 mg by mouth daily. 11/25/23   Tereso Newcomer T, PA-C  Continuous Blood Gluc Sensor (FREESTYLE LIBRE 14 DAY SENSOR) MISC Apply topically 4 (four) times daily. 09/23/22   [provider]  dapagliflozin propanediol (FARXIGA) 10 MG TABS tablet Take 1 tablet (10 mg total) by mouth daily. 12/06/23   Tereso Newcomer T, PA-C  docusate sodium (COLACE) 100 MG capsule Take 1 capsule (100 mg total) by mouth 2 (two) times daily. 12/03/23 12/02/24  Rolly Salter, MD  escitalopram (LEXAPRO) 10 MG tablet Take 10 mg by mouth  daily. 01/27/22   [provider]  fluconazole (DIFLUCAN) 100 MG tablet Take 1 tablet by mouth once a week.    [provider]  fluticasone (FLONASE) 50 MCG/ACT nasal spray Place 2 sprays into both nostrils daily.    [provider]  Fluticasone Propionate, Inhal, (FLOVENT DISKUS) 50 MCG/ACT AEPB Inhale 1 puff into the lungs 2 (two) times daily. 12/06/23   Cox, Burman Foster, NP  icosapent Ethyl (VASCEPA) 1 g capsule Take 2 capsules (2 g total) by mouth 2 (two) times daily. 10/27/23   Tereso Newcomer  T, PA-C  insulin aspart (NOVOLOG) 100 UNIT/ML injection Inject 3 Units into the skin 3 (three) times daily with meals. 07/13/22   Dahal, Melina Schools, MD  insulin glargine (LANTUS SOLOSTAR) 100 UNIT/ML Solostar Pen Inject 35 Units into the skin 2 (two) times daily. 05/03/23   Dameron, Nolberto Hanlon, DO  Insulin Pen Needle 32G X 4 MM MISC Use as directed with insulin pens 05/03/23   Dameron, Nolberto Hanlon, DO  ketotifen (ZADITOR) 0.035 % ophthalmic solution Place 1 drop into the left eye 2 (two) times daily. 12/03/23   Rolly Salter, MD  lidocaine (LIDODERM) 5 % Place 1 patch onto the skin daily.    [provider]  loratadine (CLARITIN) 10 MG tablet Take 10 mg by mouth daily. 09/27/22   [provider]  metFORMIN (GLUCOPHAGE) 1000 MG tablet Take 1 tablet (1,000 mg total) by mouth 2 (two) times daily with a meal. 11/05/19   Leone Brand, NP  naloxone [facility]) nasal spray 4 mg/0.1 mL Place 4 mg into the nose as needed (overdose).    [provider]  nitroGLYCERIN (NITROSTAT) 0.4 MG SL tablet Place 1 tablet (0.4 mg total) under the tongue every 5 (five) minutes as needed. Patient taking differently: Place 0.4 mg under the tongue every 5 (five) minutes as needed for chest pain. 10/08/22   Gaston Islam., NP  oxyCODONE (ROXICODONE) 15 MG immediate release tablet Take 1 tablet (15 mg total) by mouth every 4 (four) hours as needed. 12/03/23   Rolly Salter, MD  oxyCODONE  (ROXICODONE) 15 MG immediate release tablet Take 1 tablet (15 mg total) by mouth 5 (five) times daily as needed. 12/06/23     oxyCODONE (ROXICODONE) 15 MG immediate release tablet Take 1 tablet (15 mg total) by mouth every 6 (six) hours as needed. 01/03/24     pantoprazole (PROTONIX) 40 MG tablet Take 1 tablet (40 mg total) by mouth daily. 09/23/17   Hongalgi, Maximino Greenland, MD  polyethylene glycol (MIRALAX / GLYCOLAX) 17 g packet Take 17 g by mouth daily. 12/04/23   Rolly Salter, MD  prednisoLONE acetate (PRED FORTE) 1 % ophthalmic suspension Place 1 drop into the left eye 4 (four) times daily.    [provider]  pregabalin (LYRICA) 100 MG capsule Take 100 mg by mouth 3 (three) times daily. 09/10/22   [provider]  promethazine (PHENERGAN) 25 MG tablet Take 25 mg by mouth every 4 (four) hours as needed for nausea or vomiting. 09/10/22   [provider]  ranolazine (RANEXA) 500 MG 12 hr tablet Take 1 tablet (500 mg total) by mouth 2 (two) times daily. 10/27/23   Tereso Newcomer T, PA-C  tamsulosin (FLOMAX) 0.4 MG CAPS capsule Take 1 capsule (0.4 mg total) by mouth daily. 12/04/23   Rolly Salter, MD  tiZANidine (ZANAFLEX) 4 MG tablet Take 4 mg by mouth 3 (three) times daily. 11/25/23   [provider]  torsemide (DEMADEX) 20 MG tablet TAKE THREE TABLETS (60 MG TOTAL) BY MOUTH DAILY AT 9AM APPOINTMENT OVERDUE: NEEDS APPOINTMENT FOR FURTHER REFILLS PLEASE CALL OFFICE Patient not taking: Reported on 11/26/2023 11/25/23   Tereso Newcomer T, PA-C    Physical Exam: Vitals:   01/07/24 0250 01/07/24 0251 01/07/24 0252 01/07/24 0253  BP:      Pulse: 91 90  90  Resp: 14 15  16   Temp:   97.9 F (36.6 C)   TempSrc:   Temporal   SpO2: 98% 94%  98%  Constitutional: NAD, calm, comfortable Respiratory: clear to auscultation bilaterally, no wheezing, no crackles. Normal respiratory effort. No accessory muscle use.  Cardiovascular: Regular rate and rhythm, no murmurs / rubs /  gallops. No extremity edema. 2+ pedal pulses. No carotid bruits.  Abdomen: no tenderness, no masses palpated. No hepatosplenomegaly. Bowel sounds positive.  Musculoskeletal: B BKAs present, some pitting edema to L stump Neurologic: CN 2-12 grossly intact. Sensation intact, DTR normal. Strength 5/5 in all 4.  Psychiatric: Normal judgment and insight. Alert and oriented x 3. Normal mood.   Data Reviewed:    Labs on Admission: I have personally reviewed following labs and imaging studies  CBC: Recent Labs  Lab 01/07/24 0051  WBC 18.6*  HGB 11.7*  HCT 37.1*  MCV 84.3  PLT 240   Basic Metabolic Panel: Recent Labs  Lab 01/07/24 0051  NA 134*  K 4.5  CL 103  CO2 19*  GLUCOSE 319*  BUN 43*  CREATININE 3.36*  CALCIUM 7.9*   GFR: CrCl cannot be calculated (Unknown ideal weight.). Liver Function Tests: No results for input(s): "AST", "ALT", "ALKPHOS", "BILITOT", "PROT", "ALBUMIN" in the last 168 hours. No results for input(s): "LIPASE", "AMYLASE" in the last 168 hours. No results for input(s): "AMMONIA" in the last 168 hours. Coagulation Profile: No results for input(s): "INR", "PROTIME" in the last 168 hours. Cardiac Enzymes: No results for input(s): "CKTOTAL", "CKMB", "CKMBINDEX", "TROPONINI" in the last 168 hours. BNP (last 3 results) Recent Labs    01/13/23 1642 04/25/23 1520  PROBNP 184* 375*   HbA1C: No results for input(s): "HGBA1C" in the last 72 hours. CBG: No results for input(s): "GLUCAP" in the last 168 hours. Lipid Profile: No results for input(s): "CHOL", "HDL", "LDLCALC", "TRIG", "CHOLHDL", "LDLDIRECT" in the last 72 hours. Thyroid Function Tests: No results for input(s): "TSH", "T4TOTAL", "FREET4", "T3FREE", "THYROIDAB" in the last 72 hours. Anemia Panel: No results for input(s): "VITAMINB12", "FOLATE", "FERRITIN", "TIBC", "IRON", "RETICCTPCT" in the last 72 hours. Urine analysis:    Component Value Date/Time   COLORURINE YELLOW 11/26/2023 0644    APPEARANCEUR CLOUDY (A) 11/26/2023 0644   LABSPEC 1.016 11/26/2023 0644   PHURINE 5.0 11/26/2023 0644   GLUCOSEU >=500 (A) 11/26/2023 0644   HGBUR LARGE (A) 11/26/2023 0644   BILIRUBINUR NEGATIVE 11/26/2023 0644   KETONESUR NEGATIVE 11/26/2023 0644   PROTEINUR >=300 (A) 11/26/2023 0644   UROBILINOGEN 0.2 10/27/2007 1550   NITRITE POSITIVE (A) 11/26/2023 0644   LEUKOCYTESUR MODERATE (A) 11/26/2023 0644    Radiological Exams on Admission: DG Chest 2 View Result Date: 01/07/2024 CLINICAL DATA:  Chest pain and shortness of breath EXAM: CHEST - 2 VIEW COMPARISON:  05/01/2023 FINDINGS: Stable cardiomediastinal silhouette. Sternotomy and CABG. Low lung volumes. Left basilar atelectasis. No pleural effusion or pneumothorax. No displaced rib fractures. IMPRESSION: No acute cardiopulmonary process. Electronically Signed   By: Minerva Fester M.D.   On: 01/07/2024 01:36    EKG: Independently reviewed.   Assessment and Plan: Acute renal failure superimposed on stage 3a chronic kidney disease (HCC) AKF likely driving acute illness and presentation today (suspect chronic hypercapnic failure that was previously compensated now decompensating due to AKI) Unclear why he has AKF today however. Renal US Check UA Strict intake and output IVF: got 1L bolus in ED but given the low BUN:Cr ratio, not entirely sure this is pre-renal / ATN causing failure here. Regarding nephrotoxic meds: Not taking NSAIDS Not taking ACE/ARB Looks like he's no longer taking torsemide as of early December, not on  any diuretics either Repeat BMP in AM Likely get nephro consult in AM  Leukocytosis New Leukocytosis today, unclear cause or significance at this time. Watch for development of other SIRS, signs of source of infection CXR neg Getting UA Covid, flu, RSV negative  COPD (chronic obstructive pulmonary disease) (HCC) Presumably HVOS in setting of BMI > 70. H/o acute and chronic hypercapnea in past. Presumably  decompensated hypercapnea today in setting of AKI causing his fatigue symptoms. Ordered VBG to confirm.  Essential hypertension Cont Coreg   Heart failure with preserved ejection fraction (HCC) Continue ASA / Plavix Continue ranexa Continue statin Looks like he's no longer taking torsemide as of early last month.  BMI 70 and over, adult [facility]) Patient would very likely benefit from [facility] treatment for his DM2 if theres any way at all we could get his insurance to cover it.  Chronic back pain Continue oxy 15mg  Q6H PRN (verified dosing with [place]) Reduce Lyrica to 150mg  BID instead of TID for the moment due to AKI.  Anxiety and depression Continue wellbutrin Continue xanax 1mg  PO BID PRN.  Insulin dependent type 2 diabetes mellitus (HCC) Hold metformin and farxiga Half home dose of lantus given AKI (will do 20u BID instead of 35 BID) Mod scale SSI AC/HS for the moment  History of pulmonary embolus (PE) Lovenox DVT ppx takes ASA / Plavix but doesn't appear to be on chronic AC anymore      Advance Care Planning:   Code Status: Full Code  Consults: None, call nephro in AM  Family Communication: No family in room  Severity of Illness: The appropriate patient status for this patient is OBSERVATION. Observation status is judged to be reasonable and necessary in order to provide the required intensity of service to ensure the patient's safety. The patient's presenting symptoms, physical exam findings, and initial radiographic and laboratory data in the context of their medical condition is felt to place them at decreased risk for further clinical deterioration. Furthermore, it is anticipated that the patient will be medically stable for discharge from the hospital within 2 midnights of admission.   Author: Hillary Bow., DO 01/07/2024 3:14 AM  For on call review [email or web address].

## 2024-01-07 NOTE — ED Triage Notes (Signed)
Pt is coming in home with chest pain, shortness of breath, edema in his left stump, pt also reports a fever at home but medic got a 97.7. The chest pain started yesterday and it has been intermittent. Pt is otherwise stable at this time.   Medic vitals   118/palp 97hr 98% 373bgl 18rr 98.7

## 2024-01-07 NOTE — Assessment & Plan Note (Addendum)
On admission, Continue ASA / Plavix. Continue ranexa. Continue statin. Looks like he's no longer taking torsemide as of early last month. 01-07-2024 echo 04-2024 shows LVEF 55 to 60%. Only has some edema of left BKA stump. On RA.   01-08-2024 stable. Hold on diuresis as pt's Scr improving after foley placement.

## 2024-01-07 NOTE — ED Notes (Signed)
Pt resting with eyes closed even rise and fall of chest. Pt remains on monitor. Call bell in reach. Lights off.

## 2024-01-07 NOTE — Assessment & Plan Note (Addendum)
01-07-2024 Cont Coreg 6.25 mg bid.  01-08-2024 increase coreg to 12.5 mg bid for elevated BP. HR in the 90s.

## 2024-01-07 NOTE — Progress Notes (Signed)
Pt admitted to unit from the ED. He is a + o x 3, however lethargic. Pt easily awakens but fall right back to sleep. VSS. Tele in place, foley is draining well. Pt made comfortable in bed. Will continue to monitor.

## 2024-01-07 NOTE — Progress Notes (Signed)
PROGRESS NOTE    Joseph [name]  YHC:[id number] DOB: September 10, 1971 DOA: 01/07/2024 PCP: Cain Saupe, MD  Subjective: Pt seen and examined. Pt being evasive on who writes Rx for his meds.  Get his pain meds from practice in (Erica Cox, NP) with [facility]. But does not get any primary care from this practice.  Then he states he gets his primary care from [facility] from Dr. Cain Saupe. I tried to call [facility] office but they are closed today.  Last known BUN/Scr was in Dec 2024 when pt was admitted to the hospital. DC BUN/Scr was 35/1.55  Renal U/S negative yesterday Awaiting post-void residual bladder scan    Hospital Course: HPI: Joseph [name] is a 53 y.o. male with medical history significant of Morbid obesity with BMI of > 70, DM2, HTN, dCHF, prior PE (looks like he was on xarelto previously but dont see this on med rec), COPD, BLE BKAs, CKD 3a.   Pt in to ED with c/o fatigue, intermittent CP and SOB over past couple of days.  Subjective fever.  Some swelling of L stump.  No cough.  No trauma.  Significant Events: Admitted 01/07/2024 for AKI on CKD stage 3a   Significant Labs: WBC 18.6, HgB 11.7, Plt 240 Na 134, K 4.5, CO2 of 19, BUN 43, Scr 3.36, glu 319 BNP 347  Significant Imaging Studies: CXR No acute cardiopulmonary process.  Renal US Normal renal sonogram   Antibiotic Therapy: Anti-infectives (From admission, onward)    Start     Dose/Rate Route Frequency Ordered Stop   01/07/24 1000  fluconazole (DIFLUCAN) tablet 100 mg        100 mg Oral Every Sat 01/07/24 0455         Procedures:   Consultants:     Assessment and Plan: * Acute renal failure superimposed on stage 3a chronic kidney disease (HCC) On admission, AKF likely driving acute illness and presentation today (suspect chronic hypercapnic failure that was previously compensated now decompensating due to AKI). Unclear why he has AKF today however. Not  taking NSAIDS. Not taking ACE/ARB. Looks like he's no longer taking torsemide as of early December, not on any diuretics either.  01-07-2024 pt is urinating well. He had a full urinal at his bedside.UA shows >300 protein. Check spot urine protein:creatinine. Likely he has diabetic nephrology.  Leukocytosis On admission, New Leukocytosis today, unclear cause or significance at this time.  01-07-2024 covid/flu/rsv negative. UA negative for UTI. Continue to monitor.  COPD (chronic obstructive pulmonary disease) (HCC) On admission, Presumably HVOS in setting of BMI > 70. H/o acute and chronic hypercapnea in past. Presumably decompensated hypercapnea today in setting of AKI causing his fatigue symptoms. VBG pH 7.32, PCO2 of 43.  01-07-2024 on RA. Not exacerbated.  Essential hypertension 01-07-2024 Cont Coreg 6.25 mg bid.   Heart failure with preserved ejection fraction (HCC) On admission, Continue ASA / Plavix. Continue ranexa. Continue statin. Looks like he's no longer taking torsemide as of early last month.  01-07-2024 echo 04-2024 shows LVEF 55 to 60%. Only has some edema of left BKA stump. On RA.   BMI 70 and over, adult [facility]) On admission, Patient would very likely benefit from [facility] treatment for his DM2 if theres any way at all we could get his insurance to cover it.  01-07-2024 BMI 72.27. class III obesity.  Chronic back pain On admission, Continue oxy 15mg  Q6H PRN (verified dosing with [place]MP[place]). Reduce Lyrica to 150mg   BID instead of TID for the moment due to AKI.  01-07-2024 pt with slow cadence to his speech. Will reduce lyrica even further to 75 mg bid.  Anxiety and depression On admission, Continue wellbutrin. Continue xanax 1mg  PO BID PRN.  01-07-2024 stable.  Insulin dependent type 2 diabetes mellitus (HCC) On admission, Hold metformin and farxiga. Half home dose of lantus given AKI (will do 20u BID instead of 35 BID). Mod scale SSI AC/HS for the  moment  01-07-2024 monitor CBG   History of pulmonary embolus (PE) On admission, Lovenox DVT ppx. takes ASA / Plavix but doesn't appear to be on chronic AC anymore  01-07-2024 pt has bilateral BKA.  S/P bilateral BKA (below knee amputation) (HCC) 01-07-2024 chronic.       DVT prophylaxis:   Lovenox   Code Status: Full Code Family Communication: no family at bedside Disposition Plan: return home Reason for continuing need for hospitalization: monitoring renal function.  Objective: Vitals:   01/07/24 0636 01/07/24 0915 01/07/24 1048 01/07/24 1156  BP:  117/76  (!) 140/96  Pulse:  89  99  Resp:  14  16  Temp: 97.6 F (36.4 C)  99 F (37.2 C)   TempSrc: Axillary  Oral   SpO2:  98%  96%  Weight:        Intake/Output Summary (Last 24 hours) at 01/07/2024 1250 Last data filed at 01/07/2024 1048 Gross per 24 hour  Intake 240 ml  Output 675 ml  Net -435 ml   Filed Weights   01/07/24 0300  Weight: (!) 173.5 kg    Examination:  Physical Exam Vitals and nursing note reviewed.  Constitutional:      Appearance: He is obese.     Comments: Appears chronically ill  HENT:     Head: Normocephalic and atraumatic.     Nose: Nose normal.  Eyes:     General: No scleral icterus. Cardiovascular:     Rate and Rhythm: Normal rate and regular rhythm.  Pulmonary:     Effort: Pulmonary effort is normal. No respiratory distress.     Breath sounds: Normal breath sounds. No wheezing.  Abdominal:     General: Abdomen is protuberant. Bowel sounds are normal. There is no distension.     Palpations: Abdomen is soft.  Musculoskeletal:     Comments: Bilateral BKA +1 pitting edema of left BKA stump  Skin:    General: Skin is warm.     Capillary Refill: Capillary refill takes less than 2 seconds.  Neurological:     Mental Status: He is oriented to person, place, and time.     Data Reviewed: I have personally reviewed following labs and imaging studies  CBC: Recent Labs  Lab  01/07/24 0051 01/07/24 0339  WBC 18.6* 16.8*  HGB 11.7* 10.9*  HCT 37.1* 34.8*  MCV 84.3 85.3  PLT 240 226   Basic Metabolic Panel: Recent Labs  Lab 01/07/24 0051 01/07/24 0339  NA 134* 132*  K 4.5 4.0  CL 103 102  CO2 19* 20*  GLUCOSE 319* 276*  BUN 43* 43*  CREATININE 3.36* 3.29*  CALCIUM 7.9* 7.6*   GFR: Estimated Creatinine Clearance: 38.3 mL/min (A) (by C-G formula based on SCr of 3.29 mg/dL (H)). BNP (last 3 results) Recent Labs    04/26/23 1214 01/07/24 0045  BNP 137.3* 347.2*   CBG: Recent Labs  Lab 01/07/24 0808 01/07/24 1245  GLUCAP 243* 235*    Recent Results (from the past 240 hours)  Resp panel by RT-PCR (RSV, Flu A&B, Covid) Anterior Nasal Swab     Status: None   Collection Time: 01/07/24 12:51 AM   Specimen: Anterior Nasal Swab  Result Value Ref Range Status   SARS Coronavirus 2 by RT PCR NEGATIVE NEGATIVE Final   Influenza A by PCR NEGATIVE NEGATIVE Final   Influenza B by PCR NEGATIVE NEGATIVE Final    Comment: (NOTE) The Xpert Xpress SARS-CoV-2/FLU/RSV plus assay is intended as an aid in the diagnosis of influenza from Nasopharyngeal swab specimens and should not be used as a sole basis for treatment. Nasal washings and aspirates are unacceptable for Xpert Xpress SARS-CoV-2/FLU/RSV testing.  Fact Sheet for Patients: BloggerCourse.com  Fact Sheet for Healthcare Providers: SeriousBroker.it  This test is not yet approved or cleared by the [place] FDA and has been authorized for detection and/or diagnosis of SARS-CoV-2 by FDA under an Emergency Use Authorization (EUA). This EUA will remain in effect (meaning this test can be used) for the duration of the COVID-19 declaration under Section 564(b)(1) of the Act, 21 U.S.C. section 360bbb-3(b)(1), unless the authorization is terminated or revoked.     Resp Syncytial Virus by PCR NEGATIVE NEGATIVE Final    Comment: (NOTE) Fact Sheet  for Patients: BloggerCourse.com  Fact Sheet for Healthcare Providers: SeriousBroker.it  This test is not yet approved or cleared by the [place] FDA and has been authorized for detection and/or diagnosis of SARS-CoV-2 by FDA under an Emergency Use Authorization (EUA). This EUA will remain in effect (meaning this test can be used) for the duration of the COVID-19 declaration under Section 564(b)(1) of the Act, 21 U.S.C. section 360bbb-3(b)(1), unless the authorization is terminated or revoked.  Performed at [facility], [address], [place], [name] [address]      Radiology Studies: US RENAL Result Date: 01/07/2024 CLINICAL DATA:  Acute renal failure EXAM: RENAL / URINARY TRACT ULTRASOUND COMPLETE COMPARISON:  None Available. FINDINGS: Right Kidney: Renal measurements: 11.7 x 7.7 x 6.6 cm = volume: 311 mL. Echogenicity within normal limits. No mass or hydronephrosis visualized. Left Kidney: Renal measurements: 13.5 x 7.0 x 6.3 cm = volume: 313 mL. Echogenicity within normal limits. No mass or hydronephrosis visualized. Bladder: Appears normal for degree of bladder distention. Other: None. IMPRESSION: 1. Normal renal sonogram. Electronically Signed   By: Helyn Numbers M.D.   On: 01/07/2024 03:55   DG Chest 2 View Result Date: 01/07/2024 CLINICAL DATA:  Chest pain and shortness of breath EXAM: CHEST - 2 VIEW COMPARISON:  05/01/2023 FINDINGS: Stable cardiomediastinal silhouette. Sternotomy and CABG. Low lung volumes. Left basilar atelectasis. No pleural effusion or pneumothorax. No displaced rib fractures. IMPRESSION: No acute cardiopulmonary process. Electronically Signed   By: Minerva Fester M.D.   On: 01/07/2024 01:36    Scheduled Meds:  aspirin EC  81 mg Oral Daily   atorvastatin  80 mg Oral Daily   buPROPion  150 mg Oral Daily   carvedilol  6.25 mg Oral BID WC   clopidogrel  75 mg Oral Daily   enoxaparin (LOVENOX)  injection  85 mg Subcutaneous Q24H   escitalopram  10 mg Oral Daily   fluconazole  100 mg Oral Q Sat   fluticasone  2 spray Each Nare Daily   icosapent Ethyl  2 g Oral BID   insulin aspart  0-15 Units Subcutaneous TID WC   insulin aspart  0-5 Units Subcutaneous QHS   insulin glargine-yfgn  20 Units Subcutaneous BID   ketotifen  1 drop Left Eye BID   lidocaine  1 patch Transdermal Q24H   loratadine  10 mg Oral Daily   pantoprazole  40 mg Oral Daily   pregabalin  75 mg Oral BID   ranolazine  500 mg Oral BID   tamsulosin  0.4 mg Oral Daily   Continuous Infusions:   LOS: 0 days   Time spent: 40 minutes  Carollee Herter, DO  [facility]  01/07/2024, 12:50 PM

## 2024-01-07 NOTE — Progress Notes (Signed)
   RN states that pt's post-void residual is . His AKI may be due to acute urinary retention. Will place foley catheter.  Carollee Herter, DO [facility]

## 2024-01-07 NOTE — Assessment & Plan Note (Addendum)
On admission, Presumably HVOS in setting of BMI > 70. H/o acute and chronic hypercapnea in past. Presumably decompensated hypercapnea today in setting of AKI causing his fatigue symptoms. VBG pH 7.32, PCO2 of 43. 01-07-2024 on RA. Not exacerbated.  01-08-2024 stable

## 2024-01-07 NOTE — ED Provider Notes (Signed)
[name] EMERGENCY DEPARTMENT AT [facility] Provider Note   CSN: [id number] Arrival date & time: 01/07/24  0038     History  Chief Complaint  Patient presents with   Chest Pain    Joseph [name] is a 53 y.o. male.  The history is provided by the patient and medical records.  Chest Pain  53 year old male with history of CAD, hyperlipidemia, COPD, diabetes, hypertension, chronic pain, presenting to the ED for various complaints.  Primarily, has been having chest pain.  This is intermittent for the past few days.  Pain seems to come and go at random, not really associated with exertion.  Most recently pain has been central but occasionally has it on right and left sides.  Joseph [name] denies any radiation into the neck or arms.  Intermittently has some shortness of breath.  Joseph [name] denies any significant cough.  Has felt like subjectively Joseph [name] had a fever, afebrile here.  Also has some swelling of his left stump.  Joseph [name] reports Joseph [name] has been compliant with his diuretics.  No falls or trauma.  Home Medications Prior to Admission medications   Medication Sig Start Date End Date Taking? Authorizing Provider  acetaminophen (TYLENOL) 500 MG tablet Take 1,000 mg by mouth 3 (three) times daily as needed for headache.    [provider]  albuterol (VENTOLIN HFA) 108 (90 Base) MCG/ACT inhaler Inhale 2 puffs into the lungs every 6 (six) hours as needed for shortness of breath.    [provider]  ALPRAZolam Prudy Feeler) 1 MG tablet Take 2 mg by mouth at bedtime.    [provider]  ALPRAZolam Prudy Feeler) 1 MG tablet Take 1 tablet (1 mg total) by mouth 2 (two) times daily as needed. 12/06/23     aspirin EC 81 MG tablet Take 81 mg by mouth in the morning. Swallow whole.    [provider]  atorvastatin (LIPITOR) 80 MG tablet TAKE ONE TABLET (80 MG TOTAL) BY MOUTH DAILY AT 6PM APPOINTMENT OVERDUE: NEEDS APPOINTMENT FOR FURTHER REFILLS PLEASE CALL OFFICE Patient taking differently:  Take 80 mg by mouth daily. 11/25/23   Tereso Newcomer T, PA-C  buPROPion (WELLBUTRIN XL) 150 MG 24 hr tablet Take 150 mg by mouth in the morning and at bedtime.    [provider]  carvedilol (COREG) 6.25 MG tablet TAKE ONE TABLET (6.25 MG TOTAL) BY MOUTH TWICE DAILY @9AM -5PM Patient taking differently: Take 6.25 mg by mouth 2 (two) times daily with a meal. 11/25/23   Weaver, Lorin Picket T, PA-C  clopidogrel (PLAVIX) 75 MG tablet TAKE ONE TABLET (75 MG TOTAL) BY MOUTH DAILY AT 9AM WITH BREAKFAST APPOINTMENT OVERDUE: NEEDS APPOINTMENT FOR FURTHER REFILLS, PLEASE CALL OFFICE Patient taking differently: Take 75 mg by mouth daily. 11/25/23   Tereso Newcomer T, PA-C  Continuous Blood Gluc Sensor (FREESTYLE LIBRE 14 DAY SENSOR) MISC Apply topically 4 (four) times daily. 09/23/22   [provider]  dapagliflozin propanediol (FARXIGA) 10 MG TABS tablet Take 1 tablet (10 mg total) by mouth daily. 12/06/23   Tereso Newcomer T, PA-C  docusate sodium (COLACE) 100 MG capsule Take 1 capsule (100 mg total) by mouth 2 (two) times daily. 12/03/23 12/02/24  Rolly Salter, MD  escitalopram (LEXAPRO) 10 MG tablet Take 10 mg by mouth daily. 01/27/22   [provider]  fluconazole (DIFLUCAN) 100 MG tablet Take 1 tablet by mouth once a week.    [provider]  fluticasone (FLONASE) 50 MCG/ACT nasal spray Place 2 sprays  into both nostrils daily.    [provider]  Fluticasone Propionate, Inhal, (FLOVENT DISKUS) 50 MCG/ACT AEPB Inhale 1 puff into the lungs 2 (two) times daily. 12/06/23   Cox, Burman Foster, NP  icosapent Ethyl (VASCEPA) 1 g capsule Take 2 capsules (2 g total) by mouth 2 (two) times daily. 10/27/23   Tereso Newcomer T, PA-C  insulin aspart (NOVOLOG) 100 UNIT/ML injection Inject 3 Units into the skin 3 (three) times daily with meals. 07/13/22   Dahal, Melina Schools, MD  insulin glargine (LANTUS SOLOSTAR) 100 UNIT/ML Solostar Pen Inject 35 Units into the skin 2 (two) times daily. 05/03/23   Dameron,  Nolberto Hanlon, DO  Insulin Pen Needle 32G X 4 MM MISC Use as directed with insulin pens 05/03/23   Dameron, Nolberto Hanlon, DO  ketotifen (ZADITOR) 0.035 % ophthalmic solution Place 1 drop into the left eye 2 (two) times daily. 12/03/23   Rolly Salter, MD  lidocaine (LIDODERM) 5 % Place 1 patch onto the skin daily.    [provider]  loratadine (CLARITIN) 10 MG tablet Take 10 mg by mouth daily. 09/27/22   [provider]  metFORMIN (GLUCOPHAGE) 1000 MG tablet Take 1 tablet (1,000 mg total) by mouth 2 (two) times daily with a meal. 11/05/19   Leone Brand, NP  naloxone [facility]) nasal spray 4 mg/0.1 mL Place 4 mg into the nose as needed (overdose).    [provider]  nitroGLYCERIN (NITROSTAT) 0.4 MG SL tablet Place 1 tablet (0.4 mg total) under the tongue every 5 (five) minutes as needed. Patient taking differently: Place 0.4 mg under the tongue every 5 (five) minutes as needed for chest pain. 10/08/22   Gaston Islam., NP  oxyCODONE (ROXICODONE) 15 MG immediate release tablet Take 1 tablet (15 mg total) by mouth every 4 (four) hours as needed. 12/03/23   Rolly Salter, MD  oxyCODONE (ROXICODONE) 15 MG immediate release tablet Take 1 tablet (15 mg total) by mouth 5 (five) times daily as needed. 12/06/23     oxyCODONE (ROXICODONE) 15 MG immediate release tablet Take 1 tablet (15 mg total) by mouth every 6 (six) hours as needed. 01/03/24     pantoprazole (PROTONIX) 40 MG tablet Take 1 tablet (40 mg total) by mouth daily. 09/23/17   Hongalgi, Maximino Greenland, MD  polyethylene glycol (MIRALAX / GLYCOLAX) 17 g packet Take 17 g by mouth daily. 12/04/23   Rolly Salter, MD  prednisoLONE acetate (PRED FORTE) 1 % ophthalmic suspension Place 1 drop into the left eye 4 (four) times daily.    [provider]  pregabalin (LYRICA) 100 MG capsule Take 100 mg by mouth 3 (three) times daily. 09/10/22   [provider]  promethazine (PHENERGAN) 25 MG tablet Take 25 mg by mouth every 4  (four) hours as needed for nausea or vomiting. 09/10/22   [provider]  ranolazine (RANEXA) 500 MG 12 hr tablet Take 1 tablet (500 mg total) by mouth 2 (two) times daily. 10/27/23   Tereso Newcomer T, PA-C  tamsulosin (FLOMAX) 0.4 MG CAPS capsule Take 1 capsule (0.4 mg total) by mouth daily. 12/04/23   Rolly Salter, MD  tiZANidine (ZANAFLEX) 4 MG tablet Take 4 mg by mouth 3 (three) times daily. 11/25/23   [provider]  torsemide (DEMADEX) 20 MG tablet TAKE THREE TABLETS (60 MG TOTAL) BY MOUTH DAILY AT 9AM APPOINTMENT OVERDUE: NEEDS APPOINTMENT FOR FURTHER REFILLS PLEASE CALL OFFICE Patient not taking: Reported on 11/26/2023 11/25/23  Tereso Newcomer T, PA-C      Allergies    Sulfa antibiotics    Review of Systems   Review of Systems  Cardiovascular:  Positive for chest pain and leg swelling.  All other systems reviewed and are negative.   Physical Exam Updated Vital Signs BP 110/69   Pulse 94   Temp (!) 97.5 F (36.4 C) (Oral)   Resp 18   SpO2 96%   Physical Exam Vitals and nursing note reviewed.  Constitutional:      Appearance: Joseph [name] is well-developed.  HENT:     Head: Normocephalic and atraumatic.  Eyes:     Conjunctiva/sclera: Conjunctivae normal.     Pupils: Pupils are equal, round, and reactive to light.  Cardiovascular:     Rate and Rhythm: Normal rate and regular rhythm.     Heart sounds: Normal heart sounds.  Pulmonary:     Effort: Pulmonary effort is normal.     Breath sounds: Normal breath sounds.  Abdominal:     General: Bowel sounds are normal.     Palpations: Abdomen is soft.  Musculoskeletal:        General: Normal range of motion.     Cervical back: Normal range of motion.     Comments: Bilateral BKA's, some pitting edema noted to left stump  Skin:    General: Skin is warm and dry.  Neurological:     Mental Status: Joseph [name] is alert and oriented to person, place, and time.     ED Results / Procedures / Treatments   Labs (all labs  ordered are listed, but only abnormal results are displayed) Labs Reviewed  BASIC METABOLIC PANEL - Abnormal; Notable for the following components:      Result Value   Sodium 134 (*)    CO2 19 (*)    Glucose, Bld 319 (*)    BUN 43 (*)    Creatinine, Ser 3.36 (*)    Calcium 7.9 (*)    GFR, Estimated 21 (*)    All other components within normal limits  CBC - Abnormal; Notable for the following components:   WBC 18.6 (*)    Hemoglobin 11.7 (*)    HCT 37.1 (*)    All other components within normal limits  BRAIN NATRIURETIC PEPTIDE - Abnormal; Notable for the following components:   B Natriuretic Peptide 347.2 (*)    All other components within normal limits  TROPONIN I (HIGH SENSITIVITY) - Abnormal; Notable for the following components:   Troponin I (High Sensitivity) 21 (*)    All other components within normal limits  RESP PANEL BY RT-PCR (RSV, FLU A&B, COVID)  [id number]  URINALYSIS, W/ REFLEX TO CULTURE (INFECTION SUSPECTED)  BLOOD GAS, VENOUS  TROPONIN I (HIGH SENSITIVITY)    EKG None  Radiology DG Chest 2 View Result Date: 01/07/2024 CLINICAL DATA:  Chest pain and shortness of breath EXAM: CHEST - 2 VIEW COMPARISON:  05/01/2023 FINDINGS: Stable cardiomediastinal silhouette. Sternotomy and CABG. Low lung volumes. Left basilar atelectasis. No pleural effusion or pneumothorax. No displaced rib fractures. IMPRESSION: No acute cardiopulmonary process. Electronically Signed   By: Minerva Fester M.D.   On: 01/07/2024 01:36    Procedures Procedures    Medications Ordered in ED Medications  oxyCODONE (Oxy IR/ROXICODONE) immediate release tablet 15 mg (has no administration in time range)  ALPRAZolam (XANAX) tablet 1 mg (has no administration in time range)  enoxaparin (LOVENOX) injection 85 mg (has no administration in time range)  acetaminophen (TYLENOL) tablet  650 mg (has no administration in time range)    Or  acetaminophen (TYLENOL) suppository 650 mg (has no administration  in time range)  ondansetron (ZOFRAN) tablet 4 mg (has no administration in time range)    Or  ondansetron (ZOFRAN) injection 4 mg (has no administration in time range)  insulin aspart (novoLOG) injection 0-15 Units (has no administration in time range)  insulin aspart (novoLOG) injection 0-5 Units (has no administration in time range)  oxyCODONE (Oxy IR/ROXICODONE) immediate release tablet 15 mg (15 mg Oral Given 01/07/24 0126)  sodium chloride 0.9 % bolus 1,000 mL (1,000 mLs Intravenous New Bag/Given 01/07/24 0229)    ED Course/ Medical Decision Making/ A&P                                 Medical Decision Making Amount and/or Complexity of Data Reviewed Labs: ordered. Radiology: ordered and independent interpretation performed. ECG/medicine tests: ordered and independent interpretation performed.  Risk Prescription drug management. Decision regarding hospitalization.   53 year old male presenting to the ED with chest pain and swelling of his left stump.  Intermittent over the past few days.  Pain seems to jump around his chest.  Does have some sporadic shortness of breath as well.  Joseph [name] is afebrile and nontoxic in appearance here.  Hemodynamically stable.  Labs were sent.  Labs today with leukocytosis.  No fever, tachycardia, or other SIRS criteria.  Chemistry with new renal failure-- SrCr 3.36, baseline is around 1.5- 1.7.  Joseph [name] denies any changes in his medication regimen.  Joseph [name] is still eating and drinking as normal.  No a heavy NSAID use.  Joseph [name] will require admission for this.  Does have known CHF, last EF 55-60% so feel Joseph [name] can likely tolerate small fluid bolus, will monitor closely.  Troponin is 21, likely reflective of his increased SrCr.  No EKG changes today.  No active chest pain while here.  Patient's description of pain is quite atypical but Joseph [name] does have CAD so will trend.  BNP 347.  Similar to prior.  RVP is negative.  Discussed with Dr. Julian Reil-- will admit for ongoing care.  Final  Clinical Impression(s) / ED Diagnoses Final diagnoses:  Acute renal failure, unspecified acute renal failure type [facility])    Rx / DC Orders ED Discharge Orders     None         Garlon Hatchet, PA-C 01/07/24 0341    Marily Memos, MD 01/07/24 2324

## 2024-01-07 NOTE — ED Notes (Signed)
Bladder scan . MD made aware.

## 2024-01-07 NOTE — Hospital Course (Signed)
HPI: Joseph [name] is a 53 y.o. male with medical history significant of Morbid obesity with BMI of > 70, DM2, HTN, dCHF, prior PE (looks like he was on xarelto previously but dont see this on med rec), COPD, BLE BKAs, CKD 3a.   Pt in to ED with c/o fatigue, intermittent CP and SOB over past couple of days.  Subjective fever.  Some swelling of L stump.  No cough.  No trauma.  Significant Events: Admitted 01/07/2024 for AKI on CKD stage 3a   Significant Labs: WBC 18.6, HgB 11.7, Plt 240 Na 134, K 4.5, CO2 of 19, BUN 43, Scr 3.36, glu 319 BNP 347  Significant Imaging Studies: CXR No acute cardiopulmonary process.  Renal US Normal renal sonogram   Antibiotic Therapy: Anti-infectives (From admission, onward)    Start     Dose/Rate Route Frequency Ordered Stop   01/07/24 1000  fluconazole (DIFLUCAN) tablet 100 mg        100 mg Oral Every Sat 01/07/24 0455         Procedures:   Consultants:

## 2024-01-07 NOTE — Assessment & Plan Note (Addendum)
On admission, Lovenox DVT ppx. takes ASA / Plavix but doesn't appear to be on chronic AC anymore 01-07-2024 pt has bilateral BKA.  01-08-2024 stable.

## 2024-01-07 NOTE — Assessment & Plan Note (Addendum)
On admission, Hold metformin and farxiga. Half home dose of lantus given AKI (will do 20u BID instead of 35 BID). Mod scale SSI AC/HS for the moment 01-07-2024 monitor CBG   01-08-2024 CBG rising. Pt is eating more. Start low dose lantus. Continue SSI

## 2024-01-07 NOTE — Plan of Care (Signed)

## 2024-01-08 ENCOUNTER — Encounter (HOSPITAL_COMMUNITY): Payer: Self-pay | Admitting: Internal Medicine

## 2024-01-08 DIAGNOSIS — D72829 Elevated white blood cell count, unspecified: Secondary | ICD-10-CM | POA: Diagnosis not present

## 2024-01-08 DIAGNOSIS — R338 Other retention of urine: Secondary | ICD-10-CM | POA: Diagnosis not present

## 2024-01-08 DIAGNOSIS — Z6841 Body Mass Index (BMI) 40.0 and over, adult: Secondary | ICD-10-CM | POA: Diagnosis not present

## 2024-01-08 DIAGNOSIS — N179 Acute kidney failure, unspecified: Secondary | ICD-10-CM | POA: Diagnosis not present

## 2024-01-08 LAB — CBC WITH DIFFERENTIAL/PLATELET
Abs Immature Granulocytes: 0.11 10*3/uL — ABNORMAL HIGH (ref 0.00–0.07)
Basophils Absolute: 0 10*3/uL (ref 0.0–0.1)
Basophils Relative: 0 %
Eosinophils Absolute: 0.1 10*3/uL (ref 0.0–0.5)
Eosinophils Relative: 1 %
HCT: 32.7 % — ABNORMAL LOW (ref 39.0–52.0)
Hemoglobin: 10.3 g/dL — ABNORMAL LOW (ref 13.0–17.0)
Immature Granulocytes: 1 %
Lymphocytes Relative: 7 %
Lymphs Abs: 1 10*3/uL (ref 0.7–4.0)
MCH: 26 pg (ref 26.0–34.0)
MCHC: 31.5 g/dL (ref 30.0–36.0)
MCV: 82.6 fL (ref 80.0–100.0)
Monocytes Absolute: 1.1 10*3/uL — ABNORMAL HIGH (ref 0.1–1.0)
Monocytes Relative: 8 %
Neutro Abs: 12.5 10*3/uL — ABNORMAL HIGH (ref 1.7–7.7)
Neutrophils Relative %: 83 %
Platelets: 221 10*3/uL (ref 150–400)
RBC: 3.96 MIL/uL — ABNORMAL LOW (ref 4.22–5.81)
RDW: 14.6 % (ref 11.5–15.5)
WBC: 14.8 10*3/uL — ABNORMAL HIGH (ref 4.0–10.5)
nRBC: 0 % (ref 0.0–0.2)

## 2024-01-08 LAB — COMPREHENSIVE METABOLIC PANEL
ALT: 22 U/L (ref 0–44)
AST: 15 U/L (ref 15–41)
Albumin: 1.6 g/dL — ABNORMAL LOW (ref 3.5–5.0)
Alkaline Phosphatase: 139 U/L — ABNORMAL HIGH (ref 38–126)
Anion gap: 9 (ref 5–15)
BUN: 39 mg/dL — ABNORMAL HIGH (ref 6–20)
CO2: 20 mmol/L — ABNORMAL LOW (ref 22–32)
Calcium: 8 mg/dL — ABNORMAL LOW (ref 8.9–10.3)
Chloride: 108 mmol/L (ref 98–111)
Creatinine, Ser: 2.8 mg/dL — ABNORMAL HIGH (ref 0.61–1.24)
GFR, Estimated: 27 mL/min — ABNORMAL LOW (ref >60)
Glucose, Bld: 230 mg/dL — ABNORMAL HIGH (ref 70–99)
Potassium: 4.1 mmol/L (ref 3.5–5.1)
Sodium: 137 mmol/L (ref 135–145)
Total Bilirubin: 0.8 mg/dL (ref 0.0–1.2)
Total Protein: 5.3 g/dL — ABNORMAL LOW (ref 6.5–8.1)

## 2024-01-08 LAB — MAGNESIUM: Magnesium: 1.7 mg/dL (ref 1.7–2.4)

## 2024-01-08 LAB — GLUCOSE, CAPILLARY
Glucose-Capillary: 225 mg/dL — ABNORMAL HIGH (ref 70–99)
Glucose-Capillary: 237 mg/dL — ABNORMAL HIGH (ref 70–99)
Glucose-Capillary: 255 mg/dL — ABNORMAL HIGH (ref 70–99)
Glucose-Capillary: 280 mg/dL — ABNORMAL HIGH (ref 70–99)

## 2024-01-08 MED ORDER — CARVEDILOL 6.25 MG PO TABS
6.2500 mg | ORAL_TABLET | ORAL | Status: AC
  Administered 2024-01-08: 6.25 mg via ORAL
  Filled 2024-01-08: qty 1

## 2024-01-08 MED ORDER — SODIUM CHLORIDE 0.9 % IV SOLN
1.0000 g | INTRAVENOUS | Status: DC
  Administered 2024-01-08 – 2024-01-10 (×3): 1 g via INTRAVENOUS
  Filled 2024-01-08 (×3): qty 10

## 2024-01-08 MED ORDER — FINASTERIDE 5 MG PO TABS
5.0000 mg | ORAL_TABLET | Freq: Every day | ORAL | Status: DC
  Administered 2024-01-08 – 2024-01-16 (×9): 5 mg via ORAL
  Filled 2024-01-08 (×9): qty 1

## 2024-01-08 MED ORDER — CHLORHEXIDINE GLUCONATE CLOTH 2 % EX PADS
6.0000 | MEDICATED_PAD | Freq: Every day | CUTANEOUS | Status: DC
  Administered 2024-01-08 – 2024-01-16 (×8): 6 via TOPICAL

## 2024-01-08 MED ORDER — CARVEDILOL 12.5 MG PO TABS
12.5000 mg | ORAL_TABLET | Freq: Two times a day (BID) | ORAL | Status: DC
  Administered 2024-01-08 – 2024-01-11 (×7): 12.5 mg via ORAL
  Filled 2024-01-08 (×7): qty 1

## 2024-01-08 NOTE — Plan of Care (Signed)

## 2024-01-08 NOTE — Progress Notes (Signed)
PROGRESS NOTE    Joseph [name]  ZOX:[id number] DOB: 24-Feb-1971 DOA: 01/07/2024 PCP: Cain Saupe, MD  Subjective: Pt seen and examined. Pt being evasive on who writes Rx for his meds.  Get his pain meds from practice in [place](Erica Cox, NP) with [facility]. But does not get any primary care from this practice.  Then he states he gets his primary care from [facility] from Dr. Cain Saupe. I tried to call [facility] office but they are closed today.  Last known BUN/Scr was in Dec 2024 when pt was admitted to the hospital. DC BUN/Scr was 35/1.55  Renal U/S negative yesterday Awaiting post-void residual bladder scan    Hospital Course: HPI: Joseph [name] is a 53 y.o. male with medical history significant of Morbid obesity with BMI of > 70, DM2, HTN, dCHF, prior PE (looks like he was on xarelto previously but dont see this on med rec), COPD, BLE BKAs, CKD 3a.   Pt in to ED with c/o fatigue, intermittent CP and SOB over past couple of days.  Subjective fever.  Some swelling of L stump.  No cough.  No trauma.  Significant Events: Admitted 01/07/2024 for AKI on CKD stage 3a   Significant Labs: WBC 18.6, HgB 11.7, Plt 240 Na 134, K 4.5, CO2 of 19, BUN 43, Scr 3.36, glu 319 BNP 347  Significant Imaging Studies: CXR No acute cardiopulmonary process.  Renal US Normal renal sonogram   Antibiotic Therapy: Anti-infectives (From admission, onward)    Start     Dose/Rate Route Frequency Ordered Stop   01/07/24 1000  fluconazole (DIFLUCAN) tablet 100 mg        100 mg Oral Every Sat 01/07/24 0455         Procedures:   Consultants:     Assessment and Plan: * Acute renal failure superimposed on stage 3a chronic kidney disease (HCC) On admission, AKF likely driving acute illness and presentation today (suspect chronic hypercapnic failure that was previously compensated now decompensating due to AKI). Unclear why he has AKF today however. Not  taking NSAIDS. Not taking ACE/ARB. Looks like he's no longer taking torsemide as of early December, not on any diuretics either. 01-07-2024 pt is urinating well. He had a full urinal at his bedside.UA shows >300 protein. Check spot urine protein:creatinine. Likely he has diabetic nephrology.  01-08-2024 AKI may have been due to bladder outlet obstruction from prostate. No hydronephrosis on renal U/S but pt had 483 ml in his bladder yesterday on post-void residual scan. Was also on IVF overnight. Will monitor Scr tomorrow without any running IVF and see how his Scr is doing. If continues to decrease/normalize, his AKI is due to bladder outlet obstruction from BPH. Will likely need to go home with foley and f/u with outpatient urology. Pt states he already had appointment to see urology next week. This had already been arranged prior to admission.  Acute urinary retention 01-08-2024 foley inserted yesterday due to post-void residual of 483 ml in bladder after he had urinated. Scr has decreased to 2.8 after foley insertion and IVF overnight. Pt states he has appointment next with to see urologist. Continue with flomax. Add proscar 5 mg daily.  Leukocytosis On admission, New Leukocytosis today, unclear cause or significance at this time. 01-07-2024 covid/flu/rsv negative. UA negative for UTI. Continue to monitor.  01-08-2024 febrile yesterday. Blood cx X 2. Start IV rocephin 2 gram daily. Could be prostatitis.  COPD (chronic obstructive pulmonary disease) (  HCC) On admission, Presumably HVOS in setting of BMI > 70. H/o acute and chronic hypercapnea in past. Presumably decompensated hypercapnea today in setting of AKI causing his fatigue symptoms. VBG pH 7.32, PCO2 of 43. 01-07-2024 on RA. Not exacerbated.  01-08-2024 stable  Essential hypertension 01-07-2024 Cont Coreg 6.25 mg bid.  01-08-2024 increase coreg to 12.5 mg bid for elevated BP. HR in the 90s.   Heart failure with preserved ejection  fraction (HCC) On admission, Continue ASA / Plavix. Continue ranexa. Continue statin. Looks like he's no longer taking torsemide as of early last month. 01-07-2024 echo 04-2024 shows LVEF 55 to 60%. Only has some edema of left BKA stump. On RA.   01-08-2024 stable. Hold on diuresis as pt's Scr improving after foley placement.  BMI 70 and over, adult [facility]) On admission, Patient would very likely benefit from [facility] treatment for his DM2 if theres any way at all we could get his insurance to cover it.  01-07-2024 BMI 72.27. class III obesity.  Chronic back pain On admission, Continue oxy 15mg  Q6H PRN (verified dosing with [place]). Reduce Lyrica to 150mg  BID instead of TID for the moment due to AKI. 01-07-2024 pt with slow cadence to his speech. Will reduce lyrica even further to 75 mg bid.  01-08-2024 pt's mentation has improved with drastic reduction in lyrica. Now at 75 mg bid(was at 150 mg tid).  Anxiety and depression On admission, Continue wellbutrin. Continue xanax 1mg  PO BID PRN. 01-07-2024 stable.  01-08-2024 stable.  Insulin dependent type 2 diabetes mellitus (HCC) On admission, Hold metformin and farxiga. Half home dose of lantus given AKI (will do 20u BID instead of 35 BID). Mod scale SSI AC/HS for the moment 01-07-2024 monitor CBG   01-08-2024 CBG rising. Pt is eating more. Start low dose lantus. Continue SSI  History of pulmonary embolus (PE) On admission, Lovenox DVT ppx. takes ASA / Plavix but doesn't appear to be on chronic AC anymore 01-07-2024 pt has bilateral BKA.  01-08-2024 stable.  S/P bilateral BKA (below knee amputation) (HCC) 01-07-2024 chronic.   DVT prophylaxis: heparin injection 5,000 Units Start: 01/08/24 1400    Code Status: Full Code Family Communication: no family at bedside Disposition Plan: return home Reason for continuing need for hospitalization: monitoring SCr  Objective: Vitals:   01/08/24 0030 01/08/24 0045 01/08/24 0350  01/08/24 0817  BP:   116/67 (!) 146/68  Pulse:   81 93  Resp:   17 20  Temp: 98.9 F (37.2 C)  97.6 F (36.4 C) 99.3 F (37.4 C)  TempSrc:      SpO2:  98% 98% 93%  Weight:      Height:        Intake/Output Summary (Last 24 hours) at 01/08/2024 1141 Last data filed at 01/08/2024 1100 Gross per 24 hour  Intake 643 ml  Output 2545 ml  Net -1902 ml   Filed Weights   01/07/24 0300  Weight: (!) 173.5 kg    Examination:  Physical Exam Vitals and nursing note reviewed.  Constitutional:      General: He is not in acute distress.    Appearance: He is obese. He is not toxic-appearing.  HENT:     Head: Normocephalic and atraumatic.     Nose: Nose normal.  Eyes:     General: No scleral icterus. Cardiovascular:     Rate and Rhythm: Normal rate and regular rhythm.  Abdominal:     General: Bowel sounds are normal.  Palpations: Abdomen is soft.  Musculoskeletal:     Comments: Bilateral BKA  Neurological:     Comments: Improved mentation today     Data Reviewed: I have personally reviewed following labs and imaging studies  CBC: Recent Labs  Lab 01/07/24 0051 01/07/24 0339 01/08/24 0655  WBC 18.6* 16.8* 14.8*  NEUTROABS  --   --  12.5*  HGB 11.7* 10.9* 10.3*  HCT 37.1* 34.8* 32.7*  MCV 84.3 85.3 82.6  PLT 240 226 221   Basic Metabolic Panel: Recent Labs  Lab 01/07/24 0051 01/07/24 0339 01/08/24 0655  NA 134* 132* 137  K 4.5 4.0 4.1  CL 103 102 108  CO2 19* 20* 20*  GLUCOSE 319* 276* 230*  BUN 43* 43* 39*  CREATININE 3.36* 3.29* 2.80*  CALCIUM 7.9* 7.6* 8.0*  MG  --   --  1.7   GFR: Estimated Creatinine Clearance: 45 mL/min (A) (by C-G formula based on SCr of 2.8 mg/dL (H)). Liver Function Tests: Recent Labs  Lab 01/08/24 0655  AST 15  ALT 22  ALKPHOS 139*  BILITOT 0.8  PROT 5.3*  ALBUMIN 1.6*   BNP (last 3 results) Recent Labs    04/26/23 1214 01/07/24 0045  BNP 137.3* 347.2*   HbA1C: Recent Labs    01/07/24 0535  HGBA1C 8.1*    CBG: Recent Labs  Lab 01/07/24 1245 01/07/24 1641 01/07/24 2028 01/07/24 2210 01/08/24 0812  GLUCAP 235* 226* 219* 172* 225*    Recent Results (from the past 240 hours)  Resp panel by RT-PCR (RSV, Flu A&B, Covid) Anterior Nasal Swab     Status: None   Collection Time: 01/07/24 12:51 AM   Specimen: Anterior Nasal Swab  Result Value Ref Range Status   SARS Coronavirus 2 by RT PCR NEGATIVE NEGATIVE Final   Influenza A by PCR NEGATIVE NEGATIVE Final   Influenza B by PCR NEGATIVE NEGATIVE Final    Comment: (NOTE) The Xpert Xpress SARS-CoV-2/FLU/RSV plus assay is intended as an aid in the diagnosis of influenza from Nasopharyngeal swab specimens and should not be used as a sole basis for treatment. Nasal washings and aspirates are unacceptable for Xpert Xpress SARS-CoV-2/FLU/RSV testing.  Fact Sheet for Patients: BloggerCourse.com  Fact Sheet for Healthcare Providers: SeriousBroker.it  This test is not yet approved or cleared by the [place] FDA and has been authorized for detection and/or diagnosis of SARS-CoV-2 by FDA under an Emergency Use Authorization (EUA). This EUA will remain in effect (meaning this test can be used) for the duration of the COVID-19 declaration under Section 564(b)(1) of the Act, 21 U.S.C. section 360bbb-3(b)(1), unless the authorization is terminated or revoked.     Resp Syncytial Virus by PCR NEGATIVE NEGATIVE Final    Comment: (NOTE) Fact Sheet for Patients: BloggerCourse.com  Fact Sheet for Healthcare Providers: SeriousBroker.it  This test is not yet approved or cleared by the [place] FDA and has been authorized for detection and/or diagnosis of SARS-CoV-2 by FDA under an Emergency Use Authorization (EUA). This EUA will remain in effect (meaning this test can be used) for the duration of the COVID-19 declaration under Section  564(b)(1) of the Act, 21 U.S.C. section 360bbb-3(b)(1), unless the authorization is terminated or revoked.  Performed at [facility], [address], [place], [name] [address]      Radiology Studies: US RENAL Result Date: 01/07/2024 CLINICAL DATA:  Acute renal failure EXAM: RENAL / URINARY TRACT ULTRASOUND COMPLETE COMPARISON:  None Available. FINDINGS: Right Kidney: Renal  measurements: 11.7 x 7.7 x 6.6 cm = volume: 311 mL. Echogenicity within normal limits. No mass or hydronephrosis visualized. Left Kidney: Renal measurements: 13.5 x 7.0 x 6.3 cm = volume: 313 mL. Echogenicity within normal limits. No mass or hydronephrosis visualized. Bladder: Appears normal for degree of bladder distention. Other: None. IMPRESSION: 1. Normal renal sonogram. Electronically Signed   By: Helyn Numbers M.D.   On: 01/07/2024 03:55   DG Chest 2 View Result Date: 01/07/2024 CLINICAL DATA:  Chest pain and shortness of breath EXAM: CHEST - 2 VIEW COMPARISON:  05/01/2023 FINDINGS: Stable cardiomediastinal silhouette. Sternotomy and CABG. Low lung volumes. Left basilar atelectasis. No pleural effusion or pneumothorax. No displaced rib fractures. IMPRESSION: No acute cardiopulmonary process. Electronically Signed   By: Minerva Fester M.D.   On: 01/07/2024 01:36    Scheduled Meds:  aspirin EC  81 mg Oral Daily   atorvastatin  80 mg Oral Daily   buPROPion  150 mg Oral Daily   carvedilol  12.5 mg Oral BID WC   carvedilol  6.25 mg Oral STAT   Chlorhexidine Gluconate Cloth  6 each Topical Daily   clopidogrel  75 mg Oral Daily   escitalopram  10 mg Oral Daily   finasteride  5 mg Oral Daily   fluconazole  100 mg Oral Q Sat   fluticasone  2 spray Each Nare Daily   heparin injection (subcutaneous)  5,000 Units Subcutaneous Q8H   icosapent Ethyl  2 g Oral BID   influenza vac split trivalent PF  0.5 mL Intramuscular Tomorrow-1000   insulin aspart  0-15 Units Subcutaneous TID WC   insulin aspart  0-5 Units  Subcutaneous QHS   insulin glargine-yfgn  20 Units Subcutaneous BID   ketotifen  1 drop Left Eye BID   lidocaine  1 patch Transdermal Q24H   loratadine  10 mg Oral Daily   pantoprazole  40 mg Oral Daily   pneumococcal 20-valent conjugate vaccine  0.5 mL Intramuscular Tomorrow-1000   pregabalin  75 mg Oral BID   ranolazine  500 mg Oral BID   tamsulosin  0.4 mg Oral Daily   Continuous Infusions:  cefTRIAXone (ROCEPHIN)  IV 1 g (01/08/24 0942)     LOS: 0 days   Time spent: 40 minutes  Carollee Herter, DO  [facility]  01/08/2024, 11:41 AM

## 2024-01-08 NOTE — Progress Notes (Signed)
   01/08/24 0045  BiPAP/CPAP/SIPAP  $ Non-Invasive Home Ventilator  Initial  $ Face Mask Medium Yes  BiPAP/CPAP/SIPAP Pt Type Adult  BiPAP/CPAP/SIPAP DREAMSTATIOND  Mask Type Full face mask  Mask Size Large  EPAP 5 cmH2O  Patient Home Equipment No  Auto Titrate No  CPAP/SIPAP surface wiped down Yes  BiPAP/CPAP /SiPAP Vitals  SpO2 98 %

## 2024-01-08 NOTE — Assessment & Plan Note (Addendum)
01-08-2024 foley inserted yesterday due to post-void residual of 483 ml in bladder after he had urinated. Scr has decreased to 2.8 after foley insertion and IVF overnight. Pt states he has appointment next with to see urologist. Continue with flomax. Add proscar 5 mg daily.

## 2024-01-08 NOTE — Care Management Obs Status (Signed)
MEDICARE OBSERVATION STATUS NOTIFICATION   Patient Details  Name: Joseph [name] MRN: [id number] Date of Birth: 31-Dec-1970   Medicare Observation Status Notification Given:  Yes    Lawerance Sabal, RN 01/08/2024, 1:18 PM

## 2024-01-08 NOTE — Progress Notes (Signed)
Patient with history of OSA/OHS requesting CPAP as he uses it at home.  Continue CPAP at night.

## 2024-01-09 ENCOUNTER — Observation Stay (HOSPITAL_COMMUNITY): Payer: 59

## 2024-01-09 DIAGNOSIS — Y835 Amputation of limb(s) as the cause of abnormal reaction of the patient, or of later complication, without mention of misadventure at the time of the procedure: Secondary | ICD-10-CM | POA: Diagnosis present

## 2024-01-09 DIAGNOSIS — M7989 Other specified soft tissue disorders: Secondary | ICD-10-CM | POA: Diagnosis not present

## 2024-01-09 DIAGNOSIS — J449 Chronic obstructive pulmonary disease, unspecified: Secondary | ICD-10-CM | POA: Diagnosis not present

## 2024-01-09 DIAGNOSIS — T8744 Infection of amputation stump, left lower extremity: Secondary | ICD-10-CM | POA: Diagnosis present

## 2024-01-09 DIAGNOSIS — R338 Other retention of urine: Secondary | ICD-10-CM | POA: Diagnosis not present

## 2024-01-09 DIAGNOSIS — Z23 Encounter for immunization: Secondary | ICD-10-CM | POA: Diagnosis present

## 2024-01-09 DIAGNOSIS — Z7984 Long term (current) use of oral hypoglycemic drugs: Secondary | ICD-10-CM | POA: Diagnosis not present

## 2024-01-09 DIAGNOSIS — E875 Hyperkalemia: Secondary | ICD-10-CM | POA: Diagnosis present

## 2024-01-09 DIAGNOSIS — E1122 Type 2 diabetes mellitus with diabetic chronic kidney disease: Secondary | ICD-10-CM | POA: Diagnosis present

## 2024-01-09 DIAGNOSIS — G8929 Other chronic pain: Secondary | ICD-10-CM | POA: Diagnosis present

## 2024-01-09 DIAGNOSIS — E785 Hyperlipidemia, unspecified: Secondary | ICD-10-CM | POA: Diagnosis present

## 2024-01-09 DIAGNOSIS — Z7901 Long term (current) use of anticoagulants: Secondary | ICD-10-CM | POA: Diagnosis not present

## 2024-01-09 DIAGNOSIS — R609 Edema, unspecified: Secondary | ICD-10-CM | POA: Diagnosis not present

## 2024-01-09 DIAGNOSIS — N138 Other obstructive and reflux uropathy: Secondary | ICD-10-CM | POA: Diagnosis present

## 2024-01-09 DIAGNOSIS — D72829 Elevated white blood cell count, unspecified: Secondary | ICD-10-CM | POA: Diagnosis not present

## 2024-01-09 DIAGNOSIS — I2510 Atherosclerotic heart disease of native coronary artery without angina pectoris: Secondary | ICD-10-CM | POA: Diagnosis present

## 2024-01-09 DIAGNOSIS — Z7951 Long term (current) use of inhaled steroids: Secondary | ICD-10-CM | POA: Diagnosis not present

## 2024-01-09 DIAGNOSIS — I5032 Chronic diastolic (congestive) heart failure: Secondary | ICD-10-CM | POA: Diagnosis present

## 2024-01-09 DIAGNOSIS — Z794 Long term (current) use of insulin: Secondary | ICD-10-CM | POA: Diagnosis not present

## 2024-01-09 DIAGNOSIS — N1831 Chronic kidney disease, stage 3a: Secondary | ICD-10-CM | POA: Diagnosis present

## 2024-01-09 DIAGNOSIS — L03116 Cellulitis of left lower limb: Secondary | ICD-10-CM | POA: Diagnosis present

## 2024-01-09 DIAGNOSIS — Z560 Unemployment, unspecified: Secondary | ICD-10-CM | POA: Diagnosis not present

## 2024-01-09 DIAGNOSIS — E66813 Obesity, class 3: Secondary | ICD-10-CM | POA: Diagnosis present

## 2024-01-09 DIAGNOSIS — Z6841 Body Mass Index (BMI) 40.0 and over, adult: Secondary | ICD-10-CM | POA: Diagnosis not present

## 2024-01-09 DIAGNOSIS — Z89512 Acquired absence of left leg below knee: Secondary | ICD-10-CM | POA: Diagnosis not present

## 2024-01-09 DIAGNOSIS — Z89511 Acquired absence of right leg below knee: Secondary | ICD-10-CM | POA: Diagnosis not present

## 2024-01-09 DIAGNOSIS — N401 Enlarged prostate with lower urinary tract symptoms: Secondary | ICD-10-CM | POA: Diagnosis present

## 2024-01-09 DIAGNOSIS — E662 Morbid (severe) obesity with alveolar hypoventilation: Secondary | ICD-10-CM | POA: Diagnosis present

## 2024-01-09 DIAGNOSIS — N179 Acute kidney failure, unspecified: Secondary | ICD-10-CM | POA: Diagnosis present

## 2024-01-09 DIAGNOSIS — J4489 Other specified chronic obstructive pulmonary disease: Secondary | ICD-10-CM | POA: Diagnosis present

## 2024-01-09 DIAGNOSIS — I130 Hypertensive heart and chronic kidney disease with heart failure and stage 1 through stage 4 chronic kidney disease, or unspecified chronic kidney disease: Secondary | ICD-10-CM | POA: Diagnosis present

## 2024-01-09 LAB — GLUCOSE, CAPILLARY
Glucose-Capillary: 281 mg/dL — ABNORMAL HIGH (ref 70–99)
Glucose-Capillary: 301 mg/dL — ABNORMAL HIGH (ref 70–99)
Glucose-Capillary: 197 mg/dL — ABNORMAL HIGH (ref 70–99)
Glucose-Capillary: 233 mg/dL — ABNORMAL HIGH (ref 70–99)

## 2024-01-09 LAB — COMPREHENSIVE METABOLIC PANEL
ALT: 21 U/L (ref 0–44)
AST: 17 U/L (ref 15–41)
Albumin: 1.7 g/dL — ABNORMAL LOW (ref 3.5–5.0)
Alkaline Phosphatase: 153 U/L — ABNORMAL HIGH (ref 38–126)
Anion gap: 8 (ref 5–15)
BUN: 42 mg/dL — ABNORMAL HIGH (ref 6–20)
CO2: 21 mmol/L — ABNORMAL LOW (ref 22–32)
Calcium: 8.3 mg/dL — ABNORMAL LOW (ref 8.9–10.3)
Chloride: 106 mmol/L (ref 98–111)
Creatinine, Ser: 2.56 mg/dL — ABNORMAL HIGH (ref 0.61–1.24)
GFR, Estimated: 30 mL/min — ABNORMAL LOW (ref >60)
Glucose, Bld: 280 mg/dL — ABNORMAL HIGH (ref 70–99)
Potassium: 4.6 mmol/L (ref 3.5–5.1)
Sodium: 135 mmol/L (ref 135–145)
Total Bilirubin: 0.5 mg/dL (ref 0.0–1.2)
Total Protein: 5.8 g/dL — ABNORMAL LOW (ref 6.5–8.1)

## 2024-01-09 LAB — CBC WITH DIFFERENTIAL/PLATELET
Abs Immature Granulocytes: 0.15 10*3/uL — ABNORMAL HIGH (ref 0.00–0.07)
Basophils Absolute: 0 10*3/uL (ref 0.0–0.1)
Basophils Relative: 0 %
Eosinophils Absolute: 0.2 10*3/uL (ref 0.0–0.5)
Eosinophils Relative: 2 %
HCT: 34.2 % — ABNORMAL LOW (ref 39.0–52.0)
Hemoglobin: 10.7 g/dL — ABNORMAL LOW (ref 13.0–17.0)
Immature Granulocytes: 1 %
Lymphocytes Relative: 8 %
Lymphs Abs: 1.1 10*3/uL (ref 0.7–4.0)
MCH: 26 pg (ref 26.0–34.0)
MCHC: 31.3 g/dL (ref 30.0–36.0)
MCV: 83.2 fL (ref 80.0–100.0)
Monocytes Absolute: 1.2 10*3/uL — ABNORMAL HIGH (ref 0.1–1.0)
Monocytes Relative: 9 %
Neutro Abs: 10.5 10*3/uL — ABNORMAL HIGH (ref 1.7–7.7)
Neutrophils Relative %: 80 %
Platelets: 233 10*3/uL (ref 150–400)
RBC: 4.11 MIL/uL — ABNORMAL LOW (ref 4.22–5.81)
RDW: 14.7 % (ref 11.5–15.5)
WBC: 13.2 10*3/uL — ABNORMAL HIGH (ref 4.0–10.5)
nRBC: 0 % (ref 0.0–0.2)

## 2024-01-09 MED ORDER — INSULIN ASPART 100 UNIT/ML IJ SOLN
5.0000 [IU] | Freq: Three times a day (TID) | INTRAMUSCULAR | Status: DC
Start: 2024-01-09 — End: 2024-01-11
  Administered 2024-01-09 – 2024-01-11 (×5): 5 [IU] via SUBCUTANEOUS

## 2024-01-09 MED ORDER — INSULIN GLARGINE-YFGN 100 UNIT/ML ~~LOC~~ SOLN
25.0000 [IU] | Freq: Two times a day (BID) | SUBCUTANEOUS | Status: DC
  Administered 2024-01-09 – 2024-01-10 (×2): 25 [IU] via SUBCUTANEOUS
  Filled 2024-01-09 (×3): qty 0.25

## 2024-01-09 NOTE — Progress Notes (Signed)
Lower extremity venous duplex completed. Please see CV Procedures for preliminary results.  Shona Simpson, RVT 01/09/24 2:47 PM

## 2024-01-09 NOTE — Progress Notes (Addendum)
PROGRESS NOTE    Joseph [name]  WUJ:[id number] DOB: [birth year] DOA: 01/07/2024 PCP: Cain Saupe, MD   Hospital Course: HPI: Joseph [name] is a 53 y.o. male with medical history significant of Morbid obesity with BMI of > 70, DM2, HTN, dCHF, prior PE (looks like he was on xarelto previously but dont see this on med rec), COPD, BLE BKAs, CKD 3a.   Pt in to ED with c/o fatigue, intermittent CP and SOB over past couple of days.  Subjective fever.  Some swelling of L stump.  No cough.  No trauma.  Significant Events: Admitted 01/07/2024 for AKI on CKD stage 3a   Significant Labs: WBC 18.6, HgB 11.7, Plt 240 Na 134, K 4.5, CO2 of 19, BUN 43, Scr 3.36, glu 319 BNP 347  Significant Imaging Studies: CXR No acute cardiopulmonary process.  Renal US Normal renal sonogram   Antibiotic Therapy: Anti-infectives (From admission, onward)    Start     Dose/Rate Route Frequency Ordered Stop   01/07/24 1000  fluconazole (DIFLUCAN) tablet 100 mg        100 mg Oral Every Sat 01/07/24 0455        Subjective: Patient was complaining of worsening left stump pain and edema.   Assessment and Plan: * Acute renal failure superimposed on stage 3a chronic kidney disease (HCC) On admission, AKF likely driving acute illness and presentation today (suspect chronic hypercapnic failure that was previously compensated now decompensating due to AKI). Unclear why he has AKF today however. Not taking NSAIDS. Not taking ACE/ARB. Looks like he's no longer taking torsemide as of early December, not on any diuretics either. 01-07-2024 pt is urinating well. He had a full urinal at his bedside.UA shows >300 protein. Check spot urine protein:creatinine. Likely he has diabetic nephrology.  01-08-2024 AKI may have been due to bladder outlet obstruction from prostate. No hydronephrosis on renal U/S but pt had 483 ml in his bladder yesterday on post-void residual scan. Was also on IVF overnight. Will monitor Scr tomorrow  without any running IVF and see how his Scr is doing. If continues to decrease/normalize, his AKI is due to bladder outlet obstruction from BPH. Will likely need to go home with foley and f/u with outpatient urology. Pt states he already had appointment to see urology next week. This had already been arranged prior to admission. 1/20: Renal function started improving after placing Foley catheter.  Need to follow-up outpatient with urology for voiding trial.  Acute urinary retention 01-08-2024 foley inserted yesterday due to post-void residual of 483 ml in bladder after he had urinated. Scr has decreased to 2.8 after foley insertion and IVF overnight. Pt states he has appointment next with to see urologist. Continue with flomax. Add proscar 5 mg daily.  Leukocytosis/left stump cellulitis On admission, New Leukocytosis today, unclear cause or significance at this time. 01-07-2024 covid/flu/rsv negative. UA negative for UTI. Continue to monitor.  01-08-2024 febrile yesterday. Blood cx X 2. Start IV rocephin 2 gram daily. Could be prostatitis.  1/20: Patient appears to have left stump cellulitis with edema and erythema.  Already on ceftriaxone,Some improvement in leukocytosis. - ordered left lower extremity venous Doppler to rule out DVT  COPD (chronic obstructive pulmonary disease) (HCC) On admission, Presumably HVOS in setting of BMI > 70. H/o acute and chronic hypercapnea in past. Presumably decompensated hypercapnea today in setting of AKI causing his fatigue symptoms. VBG pH 7.32, PCO2 of 43. 01-07-2024 on RA. Not exacerbated.  01-08-2024 stable  Essential hypertension 01-07-2024 Cont Coreg 6.25 mg bid.  01-08-2024 increase coreg to 12.5 mg bid for elevated BP. HR in the 90s.   Heart failure with preserved ejection fraction (HCC) On admission, Continue ASA / Plavix. Continue ranexa. Continue statin. Looks like he's no longer taking torsemide as of early last month. 01-07-2024 echo  04-2024 shows LVEF 55 to 60%. Only has some edema of left BKA stump. On RA.   01-08-2024 stable. Hold on diuresis as pt's Scr improving after foley placement.  BMI 70 and over, adult [facility]) On admission, Patient would very likely benefit from [facility] treatment for his DM2 if theres any way at all we could get his insurance to cover it.  01-07-2024 BMI 72.27. class III obesity.  Chronic back pain On admission, Continue oxy 15mg  Q6H PRN (verified dosing with [employer]). Reduce Lyrica to 150mg  BID instead of TID for the moment due to AKI. 01-07-2024 pt with slow cadence to his speech. Will reduce lyrica even further to 75 mg bid.  01-08-2024 pt's mentation has improved with drastic reduction in lyrica. Now at 75 mg bid(was at 150 mg tid).  Anxiety and depression On admission, Continue wellbutrin. Continue xanax 1mg  PO BID PRN. 01-07-2024 stable.  01-08-2024 stable.  Insulin dependent type 2 diabetes mellitus (HCC) On admission, Hold metformin and farxiga. Half home dose of lantus given AKI (will do 20u BID instead of 35 BID). Mod scale SSI AC/HS for the moment 01-07-2024 monitor CBG   01-08-2024 CBG rising. Pt is eating more. Start low dose lantus. Continue SSI  1/20: CBG elevated.  Increasing Semglee to 25 twice daily and adding 5 units with meal along with SSI  History of pulmonary embolus (PE) On admission, Lovenox DVT ppx. takes ASA / Plavix but doesn't appear to be on chronic AC anymore 01-07-2024 pt has bilateral BKA.  01-08-2024 stable.  S/P bilateral BKA (below knee amputation) (HCC) 01-07-2024 chronic.   DVT prophylaxis: heparin injection 5,000 Units Start: 01/08/24 1400    Code Status: Full Code Family Communication: no family at bedside Disposition Plan: return home Reason for continuing need for hospitalization: monitoring SCr  Objective: Vitals:   01/08/24 2141 01/09/24 0452 01/09/24 0919 01/09/24 0919  BP:  (!) 154/84 (!) 151/98 (!) 151/98  Pulse:  85 87 86   Resp: 14 17    Temp:  98.3 F (36.8 C) 98.1 F (36.7 C) 98.1 F (36.7 C)  TempSrc:  Oral    SpO2:  97% 96% 96%  Weight:      Height:        Intake/Output Summary (Last 24 hours) at 01/09/2024 1424 Last data filed at 01/09/2024 1041 Gross per 24 hour  Intake --  Output 2100 ml  Net -2100 ml   Filed Weights   01/07/24 0300  Weight: (!) 173.5 kg    Examination:  General.  Morbidly obese gentleman, in no acute distress. Pulmonary.  Lungs clear bilaterally, normal respiratory effort. CV.  Regular rate and rhythm, no JVD, rub or murmur. Abdomen.  Soft, nontender, nondistended, BS positive. CNS.  Alert and oriented .  No focal neurologic deficit. Extremities.  Bilateral BKA, 2+ edema and erythema of left stump. Psychiatry.  Judgment and insight appears normal.   Data Reviewed: I have personally reviewed following labs and imaging studies  CBC: Recent Labs  Lab 01/07/24 0051 01/07/24 0339 01/08/24 0655 01/09/24 0607  WBC 18.6* 16.8* 14.8* 13.2*  NEUTROABS  --   --  12.5* 10.5*  HGB 11.7* 10.9*  10.3* 10.7*  HCT 37.1* 34.8* 32.7* 34.2*  MCV 84.3 85.3 82.6 83.2  PLT 240 226 221 233   Basic Metabolic Panel: Recent Labs  Lab 01/07/24 0051 01/07/24 0339 01/08/24 0655 01/09/24 0607  NA 134* 132* 137 135  K 4.5 4.0 4.1 4.6  CL 103 102 108 106  CO2 19* 20* 20* 21*  GLUCOSE 319* 276* 230* 280*  BUN 43* 43* 39* 42*  CREATININE 3.36* 3.29* 2.80* 2.56*  CALCIUM 7.9* 7.6* 8.0* 8.3*  MG  --   --  1.7  --    GFR: Estimated Creatinine Clearance: 49.2 mL/min (A) (by C-G formula based on SCr of 2.56 mg/dL (H)). Liver Function Tests: Recent Labs  Lab 01/08/24 0655 01/09/24 0607  AST 15 17  ALT 22 21  ALKPHOS 139* 153*  BILITOT 0.8 0.5  PROT 5.3* 5.8*  ALBUMIN 1.6* 1.7*   BNP (last 3 results) Recent Labs    04/26/23 1214 01/07/24 0045  BNP 137.3* 347.2*   HbA1C: Recent Labs    01/07/24 0535  HGBA1C 8.1*   CBG: Recent Labs  Lab 01/08/24 1145  01/08/24 1612 01/08/24 1956 01/09/24 0921 01/09/24 1226  GLUCAP 237* 255* 280* 281* 301*    Recent Results (from the past 240 hours)  Resp panel by RT-PCR (RSV, Flu A&B, Covid) Anterior Nasal Swab     Status: None   Collection Time: 01/07/24 12:51 AM   Specimen: Anterior Nasal Swab  Result Value Ref Range Status   SARS Coronavirus 2 by RT PCR NEGATIVE NEGATIVE Final   Influenza A by PCR NEGATIVE NEGATIVE Final   Influenza B by PCR NEGATIVE NEGATIVE Final    Comment: (NOTE) The Xpert Xpress SARS-CoV-2/FLU/RSV plus assay is intended as an aid in the diagnosis of influenza from Nasopharyngeal swab specimens and should not be used as a sole basis for treatment. Nasal washings and aspirates are unacceptable for Xpert Xpress SARS-CoV-2/FLU/RSV testing.  Fact Sheet for Patients: BloggerCourse.com  Fact Sheet for Healthcare Providers: SeriousBroker.it  This test is not yet approved or cleared by the [place] FDA and has been authorized for detection and/or diagnosis of SARS-CoV-2 by FDA under an Emergency Use Authorization (EUA). This EUA will remain in effect (meaning this test can be used) for the duration of the COVID-19 declaration under Section 564(b)(1) of the Act, 21 U.S.C. section 360bbb-3(b)(1), unless the authorization is terminated or revoked.     Resp Syncytial Virus by PCR NEGATIVE NEGATIVE Final    Comment: (NOTE) Fact Sheet for Patients: BloggerCourse.com  Fact Sheet for Healthcare Providers: SeriousBroker.it  This test is not yet approved or cleared by the [place] FDA and has been authorized for detection and/or diagnosis of SARS-CoV-2 by FDA under an Emergency Use Authorization (EUA). This EUA will remain in effect (meaning this test can be used) for the duration of the COVID-19 declaration under Section 564(b)(1) of the Act, 21 U.S.C. section  360bbb-3(b)(1), unless the authorization is terminated or revoked.  Performed at [facility], [address] [place], [name] [address]   Culture, blood (Routine X 2) w Reflex to ID Panel     Status: None (Preliminary result)   Collection Time: 01/08/24  8:20 AM   Specimen: BLOOD LEFT ARM  Result Value Ref Range Status   Specimen Description BLOOD LEFT ARM  Final   Special Requests   Final    BOTTLES DRAWN AEROBIC AND ANAEROBIC Blood Culture results may not be optimal due to an inadequate volume  of blood received in culture bottles   Culture   Final    NO GROWTH 1 DAY Performed at [facility], [address] [place], [name] [address]    Report Status PENDING  Incomplete  Culture, blood (Routine X 2) w Reflex to ID Panel     Status: None (Preliminary result)   Collection Time: 01/08/24  8:23 AM   Specimen: BLOOD RIGHT HAND  Result Value Ref Range Status   Specimen Description BLOOD RIGHT HAND  Final   Special Requests   Final    BOTTLES DRAWN AEROBIC AND ANAEROBIC Blood Culture results may not be optimal due to an inadequate volume of blood received in culture bottles   Culture   Final    NO GROWTH 1 DAY Performed at [facility], [address] [place], [name] [address]    Report Status PENDING  Incomplete     Radiology Studies: No results found.   Scheduled Meds:  aspirin EC  81 mg Oral Daily   atorvastatin  80 mg Oral Daily   buPROPion  150 mg Oral Daily   carvedilol  12.5 mg Oral BID WC   Chlorhexidine Gluconate Cloth  6 each Topical Daily   clopidogrel  75 mg Oral Daily   escitalopram  10 mg Oral Daily   finasteride  5 mg Oral Daily   fluconazole  100 mg Oral Q Sat   fluticasone  2 spray Each Nare Daily   heparin injection (subcutaneous)  5,000 Units Subcutaneous Q8H   icosapent Ethyl  2 g Oral BID   influenza vac split trivalent PF  0.5 mL Intramuscular Tomorrow-1000   insulin aspart  0-15 Units Subcutaneous TID WC   insulin aspart  0-5  Units Subcutaneous QHS   insulin aspart  5 Units Subcutaneous TID WC   insulin glargine-yfgn  25 Units Subcutaneous BID   ketotifen  1 drop Left Eye BID   lidocaine  1 patch Transdermal Q24H   loratadine  10 mg Oral Daily   pantoprazole  40 mg Oral Daily   pregabalin  75 mg Oral BID   ranolazine  500 mg Oral BID   tamsulosin  0.4 mg Oral Daily   Continuous Infusions:  cefTRIAXone (ROCEPHIN)  IV 1 g (01/09/24 1041)     LOS: 0 days   Time spent: 45 minutes  Arnetha Courser, MD  [facility]  01/09/2024, 2:24 PM

## 2024-01-09 NOTE — Plan of Care (Signed)
  Problem: Coping: Goal: Ability to adjust to condition or change in health will improve Outcome: Progressing   Problem: Metabolic: Goal: Ability to maintain appropriate glucose levels will improve Outcome: Progressing   Problem: Skin Integrity: Goal: Risk for impaired skin integrity will decrease Outcome: Progressing   Problem: Activity: Goal: Risk for activity intolerance will decrease Outcome: Progressing

## 2024-01-09 NOTE — Progress Notes (Signed)
   01/09/24 2145  BiPAP/CPAP/SIPAP  BiPAP/CPAP/SIPAP Pt Type Adult  BiPAP/CPAP/SIPAP DREAMSTATIOND  Reason BIPAP/CPAP not in use Non-compliant

## 2024-01-09 NOTE — Inpatient Diabetes Management (Signed)
Inpatient Diabetes Program Recommendations  AACE/ADA: New Consensus Statement on Inpatient Glycemic Control (2015)  Target Ranges:  Prepandial:   less than 140 mg/dL      Peak postprandial:   less than 180 mg/dL (1-2 hours)      Critically ill patients:  140 - 180 mg/dL   Lab Results  Component Value Date   GLUCAP 281 (H) 01/09/2024   HGBA1C 8.1 (H) 01/07/2024    Latest Reference Range & Units 01/08/24 08:12 01/08/24 11:45 01/08/24 16:12 01/08/24 19:56 01/09/24 09:21  Glucose-Capillary 70 - 99 mg/dL 161 (H) 096 (H) 045 (H) 280 (H) 281 (H)  (H): Data is abnormally high  Diabetes history: DM2 Outpatient Diabetes medications: Lantus 35 units bid, Novolog 3 units tid meal coverage, Farxiga 10 mg daily, Metformin 1 gm bid Current orders for Inpatient glycemic control: Semglee 20 units bid, Novolog 0-15 units tid, 0-5 units hs  Inpatient Diabetes Program Recommendations:   Please consider: -Increase Semglee to 25 units bid -Add Novolog 5 units tid meal coverage if eats 50%  Thank you, Darel Hong E. Elih Mooney, RN, MSN, CDCES  Diabetes Coordinator Inpatient Glycemic Control Team Team Pager [phone] (8am-5pm) 01/09/2024 11:01 AM

## 2024-01-10 DIAGNOSIS — N179 Acute kidney failure, unspecified: Secondary | ICD-10-CM | POA: Diagnosis not present

## 2024-01-10 DIAGNOSIS — N1831 Chronic kidney disease, stage 3a: Secondary | ICD-10-CM | POA: Diagnosis not present

## 2024-01-10 LAB — GLUCOSE, CAPILLARY
Glucose-Capillary: 252 mg/dL — ABNORMAL HIGH (ref 70–99)
Glucose-Capillary: 280 mg/dL — ABNORMAL HIGH (ref 70–99)
Glucose-Capillary: 206 mg/dL — ABNORMAL HIGH (ref 70–99)
Glucose-Capillary: 201 mg/dL — ABNORMAL HIGH (ref 70–99)

## 2024-01-10 LAB — CBC
HCT: 34.1 % — ABNORMAL LOW (ref 39.0–52.0)
Hemoglobin: 10.7 g/dL — ABNORMAL LOW (ref 13.0–17.0)
MCH: 26.4 pg (ref 26.0–34.0)
MCHC: 31.4 g/dL (ref 30.0–36.0)
MCV: 84.2 fL (ref 80.0–100.0)
Platelets: 285 10*3/uL (ref 150–400)
RBC: 4.05 MIL/uL — ABNORMAL LOW (ref 4.22–5.81)
RDW: 14.8 % (ref 11.5–15.5)
WBC: 12.6 10*3/uL — ABNORMAL HIGH (ref 4.0–10.5)
nRBC: 0 % (ref 0.0–0.2)

## 2024-01-10 LAB — BASIC METABOLIC PANEL
Anion gap: 7 (ref 5–15)
BUN: 48 mg/dL — ABNORMAL HIGH (ref 6–20)
CO2: 24 mmol/L (ref 22–32)
Calcium: 8.4 mg/dL — ABNORMAL LOW (ref 8.9–10.3)
Chloride: 103 mmol/L (ref 98–111)
Creatinine, Ser: 2.31 mg/dL — ABNORMAL HIGH (ref 0.61–1.24)
GFR, Estimated: 34 mL/min — ABNORMAL LOW (ref >60)
Glucose, Bld: 279 mg/dL — ABNORMAL HIGH (ref 70–99)
Potassium: 4.9 mmol/L (ref 3.5–5.1)
Sodium: 134 mmol/L — ABNORMAL LOW (ref 135–145)

## 2024-01-10 MED ORDER — SODIUM CHLORIDE 0.9 % IV SOLN
2.0000 g | INTRAVENOUS | Status: DC
  Administered 2024-01-11 – 2024-01-14 (×4): 2 g via INTRAVENOUS
  Filled 2024-01-10 (×4): qty 20

## 2024-01-10 MED ORDER — INSULIN GLARGINE-YFGN 100 UNIT/ML ~~LOC~~ SOLN
30.0000 [IU] | Freq: Two times a day (BID) | SUBCUTANEOUS | Status: DC
  Administered 2024-01-10 – 2024-01-11 (×2): 30 [IU] via SUBCUTANEOUS
  Filled 2024-01-10 (×3): qty 0.3

## 2024-01-10 MED ORDER — SODIUM CHLORIDE 0.9 % IV SOLN
1.0000 g | Freq: Once | INTRAVENOUS | Status: AC
  Administered 2024-01-10: 1 g via INTRAVENOUS
  Filled 2024-01-10: qty 10

## 2024-01-10 NOTE — Progress Notes (Signed)
   01/10/24 2300  BiPAP/CPAP/SIPAP  BiPAP/CPAP/SIPAP Pt Type Adult  Reason BIPAP/CPAP not in use Non-compliant

## 2024-01-10 NOTE — Inpatient Diabetes Management (Signed)
Inpatient Diabetes Program Recommendations  AACE/ADA: New Consensus Statement on Inpatient Glycemic Control (2015)  Target Ranges:  Prepandial:   less than 140 mg/dL      Peak postprandial:   less than 180 mg/dL (1-2 hours)      Critically ill patients:  140 - 180 mg/dL   Lab Results  Component Value Date   GLUCAP 252 (H) 01/10/2024   HGBA1C 8.1 (H) 01/07/2024    Latest Reference Range & Units 01/09/24 09:21 01/09/24 12:26 01/09/24 17:25 01/09/24 22:37 01/10/24 07:44  Glucose-Capillary 70 - 99 mg/dL 119 (H) 147 (H) 829 (H) 233 (H) 252 (H)  (H): Data is abnormally high  Diabetes history: DM2 Outpatient Diabetes medications: Lantus 35 units bid, Novolog 3 units tid meal coverage, Farxiga 10 mg daily, Metformin 1 gm bid Current orders for Inpatient glycemic control: Semglee 25 units bid, Novolog 5 units tid meal coverage, Novolog 0-15 units tid, 0-5 units hs  Inpatient Diabetes Program Recommendations:   Please consider: -Increase Semglee to 30 units bid  Thank you, Darel Hong E. Sheilyn Boehlke, RN, MSN, CDCES  Diabetes Coordinator Inpatient Glycemic Control Team Team Pager [phone] (8am-5pm) 01/10/2024 11:15 AM

## 2024-01-10 NOTE — Progress Notes (Signed)
PROGRESS NOTE  Joseph [name]  WUJ:[id number] DOB: 11/03/1971 DOA: 01/07/2024 PCP: Cain Saupe, MD   Brief Narrative: Patient is a 53 year old male with history of morbid obesity, diabetes type 2, hypertension, diastolic CHF, COPD, bilateral lower extremity BKA, CKD stage III with baseline creatinine of 1.7 who presented from home with complaint of fatigue, chest pain, shortness of breath, subjective fever, swelling of left BKA stump.  On presentation, he had leukocytosis with WBC count of 18.6, creatinine of 3.3.  Chest x-ray did not show any acute findings.  Started on ceftriaxone for cellulitis of left BKA stump.  [name] course remarkable for urinary retention, Foley placed.  Improving kidney function,bmp pending.   Assessment & Plan:  Principal Problem:   Acute renal failure superimposed on stage 3a chronic kidney disease (HCC) Active Problems:   Acute urinary retention   COPD (chronic obstructive pulmonary disease) (HCC)   Leukocytosis   History of pulmonary embolus (PE)   Insulin dependent type 2 diabetes mellitus (HCC)   Anxiety and depression   Chronic back pain   BMI 70 and over, adult (HCC)   Heart failure with preserved ejection fraction (HCC)   Essential hypertension   S/P bilateral BKA (below knee amputation) (HCC)   Acute kidney failure (HCC)   AKI on CKD stage IIIa: Baseline creatinine 1.7.  Presented with creatinine of more than 3.  Could be from bladder outlet obstruction.  Status post Foley placement.  Creatinine trending down.  BMP from today pending.  Urine retention: Foley placed here.  Added Flomax, Proscar He needs to follow-up with urology as an outpatient.  He has an appointment on Jan 31st.  Most likely will discharge him on Foley catheter  Concern for left stump cellulitis: Presented with leukocytosis but no fever.  Blood cultures have been negative so far.  Currently on ceftriaxone.  Doppler did not show any DVT.  Left BKA stump still appears tender,  erythematous, has  shallow ulcer.  Will consult Dr. [name].  COPD: Currently not on exacerbation.  Hypertension: Continue current medications.  Blood pressure stable  HFpEF: Echo done here showed EF of 55 to 60%.  Diuretics on hold. Takes aspirin, Plavix, Ranexa, statin.  Chronic back pain: Continue pain management, supportive care  Anxiety/depression: On Wellbutrin, Xanax  Insulin-dependent diabetes type 2: Diabetic coordinator  following.  Takes metformin, Farxiga ,insulin at home.  Both oral meds on  hold due to AKI on CKD.  Continue current insulin regimen  History of PE: Currently not on anticoagulation  History of bilateral BKA/debility: Patient lives with his family at home.  Ambulates with the help of wheelchair.  Anticipate discharge home when ready.  Morbid obesity: BMI of more than 70         DVT prophylaxis:heparin injection 5,000 Units Start: 01/08/24 1400     Code Status: Full Code  Family Communication: None at the bedside  Patient status:Inpatient  Patient is from :home  Anticipated discharge NF:AOZH  Estimated DC date: 1 to 2 days   Consultants: None  Procedures:None  Antimicrobials:  Anti-infectives (From admission, onward)    Start     Dose/Rate Route Frequency Ordered Stop   01/08/24 0800  cefTRIAXone (ROCEPHIN) 1 g in sodium chloride 0.9 % 100 mL IVPB        1 g 200 mL/hr over 30 Minutes Intravenous Every 24 hours 01/08/24 0705     01/07/24 1000  fluconazole (DIFLUCAN) tablet 100 mg        100 mg Oral  Every Sat 01/07/24 0455         Subjective: Patient seen and examined at bedside today.  He was overall comfortable, lying in bed.  Still having discomfort on the left BKA stump.  On examination of left BKA stump, he had severe pain, appears slightly erythematous and edematous, no frank drainage.  Foley draining clear urine  Objective: Vitals:   01/09/24 1724 01/09/24 2012 01/10/24 0527 01/10/24 0745  BP: (!) 145/91 133/79 125/83 122/75   Pulse: 79 81 74 76  Resp:      Temp: 98.4 F (36.9 C) 98.5 F (36.9 C) 98 F (36.7 C) 98.2 F (36.8 C)  TempSrc:  Oral Oral Oral  SpO2: 95% 96% 97% 99%  Weight:      Height:        Intake/Output Summary (Last 24 hours) at 01/10/2024 1000 Last data filed at 01/10/2024 0156 Gross per 24 hour  Intake --  Output 2200 ml  Net -2200 ml   Filed Weights   01/07/24 0300  Weight: (!) 173.5 kg    Examination:  General exam: Overall comfortable, not in distress, morbidly obese HEENT: PERRL Respiratory system:  no wheezes or crackles  Cardiovascular system: S1 & S2 heard, RRR.  Gastrointestinal system: Abdomen is nondistended, soft and nontender. Central nervous system: Alert and oriented Extremities: Bilateral BKA's, shallow ulcer, edema/erythema of left BKA stump  skin: No rashes, no icterus  GU: Foley   Data Reviewed: I have personally reviewed following labs and imaging studies  CBC: Recent Labs  Lab 01/07/24 0051 01/07/24 0339 01/08/24 0655 01/09/24 0607  WBC 18.6* 16.8* 14.8* 13.2*  NEUTROABS  --   --  12.5* 10.5*  HGB 11.7* 10.9* 10.3* 10.7*  HCT 37.1* 34.8* 32.7* 34.2*  MCV 84.3 85.3 82.6 83.2  PLT 240 226 221 233   Basic Metabolic Panel: Recent Labs  Lab 01/07/24 0051 01/07/24 0339 01/08/24 0655 01/09/24 0607  NA 134* 132* 137 135  K 4.5 4.0 4.1 4.6  CL 103 102 108 106  CO2 19* 20* 20* 21*  GLUCOSE 319* 276* 230* 280*  BUN 43* 43* 39* 42*  CREATININE 3.36* 3.29* 2.80* 2.56*  CALCIUM 7.9* 7.6* 8.0* 8.3*  MG  --   --  1.7  --      Recent Results (from the past 240 hours)  Resp panel by RT-PCR (RSV, Flu A&B, Covid) Anterior Nasal Swab     Status: None   Collection Time: 01/07/24 12:51 AM   Specimen: Anterior Nasal Swab  Result Value Ref Range Status   SARS Coronavirus 2 by RT PCR NEGATIVE NEGATIVE Final   Influenza A by PCR NEGATIVE NEGATIVE Final   Influenza B by PCR NEGATIVE NEGATIVE Final    Comment: (NOTE) The Xpert Xpress  SARS-CoV-2/FLU/RSV plus assay is intended as an aid in the diagnosis of influenza from Nasopharyngeal swab specimens and should not be used as a sole basis for treatment. Nasal washings and aspirates are unacceptable for Xpert Xpress SARS-CoV-2/FLU/RSV testing.  Fact Sheet for Patients: BloggerCourse.com  Fact Sheet for Healthcare Providers: SeriousBroker.it  This test is not yet approved or cleared by the [place] FDA and has been authorized for detection and/or diagnosis of SARS-CoV-2 by FDA under an Emergency Use Authorization (EUA). This EUA will remain in effect (meaning this test can be used) for the duration of the COVID-19 declaration under Section 564(b)(1) of the Act, 21 U.S.C. section 360bbb-3(b)(1), unless the authorization is terminated or revoked.  Resp Syncytial Virus by PCR NEGATIVE NEGATIVE Final    Comment: (NOTE) Fact Sheet for Patients: BloggerCourse.com  Fact Sheet for Healthcare Providers: SeriousBroker.it  This test is not yet approved or cleared by the [place] FDA and has been authorized for detection and/or diagnosis of SARS-CoV-2 by FDA under an Emergency Use Authorization (EUA). This EUA will remain in effect (meaning this test can be used) for the duration of the COVID-19 declaration under Section 564(b)(1) of the Act, 21 U.S.C. section 360bbb-3(b)(1), unless the authorization is terminated or revoked.  Performed at [name], [address] [place], [name] [address]   Culture, blood (Routine X 2) w Reflex to ID Panel     Status: None (Preliminary result)   Collection Time: 01/08/24  8:20 AM   Specimen: BLOOD LEFT ARM  Result Value Ref Range Status   Specimen Description BLOOD LEFT ARM  Final   Special Requests   Final    BOTTLES DRAWN AEROBIC AND ANAEROBIC Blood Culture results may not be optimal due to an inadequate  volume of blood received in culture bottles   Culture   Final    NO GROWTH 1 DAY Performed at [facility], [address] [place], [name] [address]    Report Status PENDING  Incomplete  Culture, blood (Routine X 2) w Reflex to ID Panel     Status: None (Preliminary result)   Collection Time: 01/08/24  8:23 AM   Specimen: BLOOD RIGHT HAND  Result Value Ref Range Status   Specimen Description BLOOD RIGHT HAND  Final   Special Requests   Final    BOTTLES DRAWN AEROBIC AND ANAEROBIC Blood Culture results may not be optimal due to an inadequate volume of blood received in culture bottles   Culture   Final    NO GROWTH 1 DAY Performed at [name], [address], [place], [name] [address]    Report Status PENDING  Incomplete     Radiology Studies: VAS [place] LOWER EXTREMITY VENOUS (DVT) Result Date: 01/09/2024  Lower Venous DVT Study Patient Name:  Joseph [name]  Date of Exam:   01/09/2024 Medical Rec #: [id number]        Accession #:    [id number] Date of Birth: 03/23/1971        Patient Gender: M Patient Age:   59 years Exam Location:  [name] Procedure:      VAS [place] LOWER EXTREMITY VENOUS (DVT) Referring Phys: Tilman Neat AMIN --------------------------------------------------------------------------------  Indications: Swelling, and Edema.  Risk Factors: Immobility obesity. Limitations: Body habitus and BKA. Comparison Study: No significant changes seen since previous exam 11/18/21 Performing Technologist: Shona Simpson  Examination Guidelines: A complete evaluation includes B-mode imaging, spectral Doppler, color Doppler, and power Doppler as needed of all accessible portions of each vessel. Bilateral testing is considered an integral part of a complete examination. Limited examinations for reoccurring indications may be performed as noted. The reflux portion of the exam is performed with the patient in reverse Trendelenburg.   +-----+---------------+---------+-----------+----------+--------------+ RIGHTCompressibilityPhasicitySpontaneityPropertiesThrombus Aging +-----+---------------+---------+-----------+----------+--------------+ CFV  Full           Yes      Yes                                 +-----+---------------+---------+-----------+----------+--------------+   +---------+---------------+---------+-----------+----------+-------------------+ LEFT     CompressibilityPhasicitySpontaneityPropertiesThrombus Aging      +---------+---------------+---------+-----------+----------+-------------------+ CFV  Full           Yes      Yes                                      +---------+---------------+---------+-----------+----------+-------------------+ SFJ      Full                                                             +---------+---------------+---------+-----------+----------+-------------------+ FV Prox  Full                                                             +---------+---------------+---------+-----------+----------+-------------------+ FV Mid   Full                                                             +---------+---------------+---------+-----------+----------+-------------------+ FV Distal                        Yes                  Not well visualized +---------+---------------+---------+-----------+----------+-------------------+ PFV      Full                                                             +---------+---------------+---------+-----------+----------+-------------------+ POP      Full           Yes      Yes                                      +---------+---------------+---------+-----------+----------+-------------------+     Summary: RIGHT: - No evidence of common femoral vein obstruction.   LEFT: - There is no evidence of deep vein thrombosis in the lower extremity.  - No cystic structure found in the popliteal fossa. -  Ultrasound characteristics of enlarged lymph nodes noted in the groin.  *See table(s) above for measurements and observations. Electronically signed by Carolynn Sayers on 01/09/2024 at 4:54:13 PM.    Final     Scheduled Meds:  aspirin EC  81 mg Oral Daily   atorvastatin  80 mg Oral Daily   buPROPion  150 mg Oral Daily   carvedilol  12.5 mg Oral BID WC   Chlorhexidine Gluconate Cloth  6 each Topical Daily   clopidogrel  75 mg Oral Daily   escitalopram  10 mg Oral Daily   finasteride  5 mg Oral Daily   fluconazole  100 mg Oral Q Sat   fluticasone  2 spray Each Nare Daily   heparin  injection (subcutaneous)  5,000 Units Subcutaneous Q8H   icosapent Ethyl  2 g Oral BID   influenza vac split trivalent PF  0.5 mL Intramuscular Tomorrow-1000   insulin aspart  0-15 Units Subcutaneous TID WC   insulin aspart  0-5 Units Subcutaneous QHS   insulin aspart  5 Units Subcutaneous TID WC   insulin glargine-yfgn  25 Units Subcutaneous BID   ketotifen  1 drop Left Eye BID   lidocaine  1 patch Transdermal Q24H   loratadine  10 mg Oral Daily   pantoprazole  40 mg Oral Daily   pregabalin  75 mg Oral BID   ranolazine  500 mg Oral BID   tamsulosin  0.4 mg Oral Daily   Continuous Infusions:  cefTRIAXone (ROCEPHIN)  IV 1 g (01/10/24 0834)     LOS: 1 day   Burnadette Pop, MD [facility] P1/21/2025, 10:00 AM

## 2024-01-11 ENCOUNTER — Other Ambulatory Visit (HOSPITAL_COMMUNITY): Payer: Self-pay

## 2024-01-11 DIAGNOSIS — Z89511 Acquired absence of right leg below knee: Secondary | ICD-10-CM | POA: Diagnosis not present

## 2024-01-11 DIAGNOSIS — N1831 Chronic kidney disease, stage 3a: Secondary | ICD-10-CM | POA: Diagnosis not present

## 2024-01-11 DIAGNOSIS — L03116 Cellulitis of left lower limb: Secondary | ICD-10-CM | POA: Diagnosis not present

## 2024-01-11 DIAGNOSIS — Z89512 Acquired absence of left leg below knee: Secondary | ICD-10-CM | POA: Diagnosis not present

## 2024-01-11 DIAGNOSIS — N179 Acute kidney failure, unspecified: Secondary | ICD-10-CM | POA: Diagnosis not present

## 2024-01-11 LAB — CBC
HCT: 34 % — ABNORMAL LOW (ref 39.0–52.0)
Hemoglobin: 10.8 g/dL — ABNORMAL LOW (ref 13.0–17.0)
MCH: 26.4 pg (ref 26.0–34.0)
MCHC: 31.8 g/dL (ref 30.0–36.0)
MCV: 83.1 fL (ref 80.0–100.0)
Platelets: 329 10*3/uL (ref 150–400)
RBC: 4.09 MIL/uL — ABNORMAL LOW (ref 4.22–5.81)
RDW: 14.7 % (ref 11.5–15.5)
WBC: 13.9 10*3/uL — ABNORMAL HIGH (ref 4.0–10.5)
nRBC: 0 % (ref 0.0–0.2)

## 2024-01-11 LAB — BASIC METABOLIC PANEL
Anion gap: 9 (ref 5–15)
BUN: 49 mg/dL — ABNORMAL HIGH (ref 6–20)
CO2: 22 mmol/L (ref 22–32)
Calcium: 8.3 mg/dL — ABNORMAL LOW (ref 8.9–10.3)
Chloride: 102 mmol/L (ref 98–111)
Creatinine, Ser: 2.05 mg/dL — ABNORMAL HIGH (ref 0.61–1.24)
GFR, Estimated: 39 mL/min — ABNORMAL LOW (ref >60)
Glucose, Bld: 268 mg/dL — ABNORMAL HIGH (ref 70–99)
Potassium: 4.8 mmol/L (ref 3.5–5.1)
Sodium: 133 mmol/L — ABNORMAL LOW (ref 135–145)

## 2024-01-11 LAB — GLUCOSE, CAPILLARY
Glucose-Capillary: 262 mg/dL — ABNORMAL HIGH (ref 70–99)
Glucose-Capillary: 270 mg/dL — ABNORMAL HIGH (ref 70–99)
Glucose-Capillary: 218 mg/dL — ABNORMAL HIGH (ref 70–99)
Glucose-Capillary: 206 mg/dL — ABNORMAL HIGH (ref 70–99)

## 2024-01-11 MED ORDER — POLYETHYLENE GLYCOL 3350 17 G PO PACK
17.0000 g | PACK | Freq: Every day | ORAL | Status: DC
  Administered 2024-01-11 – 2024-01-13 (×3): 17 g via ORAL
  Filled 2024-01-11 (×3): qty 1

## 2024-01-11 MED ORDER — INSULIN ASPART 100 UNIT/ML IJ SOLN
7.0000 [IU] | Freq: Three times a day (TID) | INTRAMUSCULAR | Status: DC
  Administered 2024-01-11 – 2024-01-13 (×6): 7 [IU] via SUBCUTANEOUS

## 2024-01-11 MED ORDER — SENNA 8.6 MG PO TABS
1.0000 | ORAL_TABLET | Freq: Two times a day (BID) | ORAL | Status: DC
  Administered 2024-01-11 – 2024-01-13 (×6): 8.6 mg via ORAL
  Filled 2024-01-11 (×6): qty 1

## 2024-01-11 MED ORDER — INSULIN GLARGINE-YFGN 100 UNIT/ML ~~LOC~~ SOLN
35.0000 [IU] | Freq: Two times a day (BID) | SUBCUTANEOUS | Status: DC
  Administered 2024-01-11 – 2024-01-13 (×4): 35 [IU] via SUBCUTANEOUS
  Filled 2024-01-11 (×5): qty 0.35

## 2024-01-11 NOTE — Inpatient Diabetes Management (Signed)
Inpatient Diabetes Program Recommendations  AACE/ADA: New Consensus Statement on Inpatient Glycemic Control (2015)  Target Ranges:  Prepandial:   less than 140 mg/dL      Peak postprandial:   less than 180 mg/dL (1-2 hours)      Critically ill patients:  140 - 180 mg/dL   Lab Results  Component Value Date   GLUCAP 262 (H) 01/11/2024   HGBA1C 8.1 (H) 01/07/2024    Latest Reference Range & Units 01/10/24 07:44 01/10/24 12:01 01/10/24 16:15 01/10/24 20:25 01/11/24 08:03  Glucose-Capillary 70 - 99 mg/dL 132 (H) 440 (H) 102 (H) 201 (H) 262 (H)  (H): Data is abnormally high  Diabetes history: DM2 Outpatient Diabetes medications: Lantus 35 units bid, Novolog 3 units tid meal coverage, Farxiga 10 mg daily, Metformin 1 gm bid Current orders for Inpatient glycemic control: Semglee 30 units bid, Novolog 5 units tid meal coverage, Novolog 0-15 units tid, 0-5 units hs   Inpatient Diabetes Program Recommendations:   Please consider: -Increase Semglee to 35 units bid -Increase Novolog meal coverage to 7 if eats 50% meals  Thank you, Joseph [name]. Joseph Coltrane, RN, MSN, CDCES  Diabetes Coordinator Inpatient Glycemic Control Team Team Pager [phone] (8am-5pm) 01/11/2024 10:35 AM

## 2024-01-11 NOTE — Plan of Care (Signed)

## 2024-01-11 NOTE — Progress Notes (Signed)
    Durable Medical Equipment  (From admission, onward)           Start     Ordered   01/11/24 1611  For home use only DME standard manual wheelchair with seat cushion  Once       Comments: Patient suffers from bilateral amputee which impairs their ability to perform daily activities like bathing, grooming, and toileting in the home.  A walker will not resolve issue with performing activities of daily living. A wheelchair will allow patient to safely perform daily activities. Patient can safely propel the wheelchair in the home or has a caregiver who can provide assistance. Length of need Lifetime. Accessories: elevating leg rests (ELRs), wheel locks, extensions and anti-tippers.  Needs heavy duty due to size/ weight   01/11/24 1612            Pt requires a heavy duty wheelchair due to his weight and body habitus.

## 2024-01-11 NOTE — Progress Notes (Signed)
PROGRESS NOTE  Archit Knoth Chisholm  ZOX:[id number] DOB: 1971/04/08 DOA: 01/07/2024 PCP: Cain Saupe, MD   Brief Narrative: Patient is a 53 year old male with history of morbid obesity, diabetes type 2, hypertension, diastolic CHF, COPD, bilateral lower extremity BKA, CKD stage III with baseline creatinine of 1.7 who presented from home with complaint of fatigue, chest pain, shortness of breath, subjective fever, swelling of left BKA stump.  On presentation, he had leukocytosis with WBC count of 18.6, creatinine of 3.3.  Chest x-ray did not show any acute findings.  Started on ceftriaxone for cellulitis of left BKA stump.  Hospital course remarkable for urinary retention, Foley placed.  Improving kidney function.   Assessment & Plan:  Principal Problem:   Acute renal failure superimposed on stage 3a chronic kidney disease (HCC) Active Problems:   Acute urinary retention   COPD (chronic obstructive pulmonary disease) (HCC)   Leukocytosis   History of pulmonary embolus (PE)   Insulin dependent type 2 diabetes mellitus (HCC)   Anxiety and depression   Chronic back pain   BMI 70 and over, adult (HCC)   Heart failure with preserved ejection fraction (HCC)   Essential hypertension   Cellulitis of left lower leg   S/P bilateral BKA (below knee amputation) (HCC)   Acute kidney failure (HCC)   AKI on CKD stage IIIa: Baseline creatinine 1.7.  Presented with creatinine of more than 3.  Could be from bladder outlet obstruction.  Status post Foley placement.  Creatinine trending down.   Urine retention: Foley placed here.  Added Flomax, Proscar He needs to follow-up with urology as an outpatient.  He has an appointment on Jan 31st.  Most likely will discharge him on Foley catheter  Concern for left stump cellulitis: Presented with leukocytosis but no fever.  Blood cultures have been negative so far.  Currently on ceftriaxone.  Doppler did not show any DVT.  Left BKA stump still appears tender,  erythematous but clinically improving with current antibiotics.He complains of pain.  Dr. [name] recommending conservative management with antibiotics, plan for surgery.    COPD: Currently not on exacerbation.  Hypertension: Continue current medications.  Blood pressure stable  HFpEF: Echo done here showed EF of 55 to 60%.  Diuretics on hold. Takes aspirin, Plavix, Ranexa, statin at home.  Currently appears euvolemic  Chronic back pain: Continue pain management, supportive care  Anxiety/depression: On Wellbutrin, Xanax  Insulin-dependent diabetes type 2: Diabetic coordinator  following.  Takes metformin, Farxiga ,insulin at home.  Both oral meds on  hold due to AKI on CKD.  Continue current insulin regimen.  Insulin being titrated.  Please  History of PE: Currently not on anticoagulation  History of bilateral BKA/debility: Patient lives with his family at home.  Ambulates with the help of wheelchair.  Anticipate discharge home when ready.  Morbid obesity: BMI of more than 70         DVT prophylaxis:heparin injection 5,000 Units Start: 01/08/24 1400     Code Status: Full Code  Family Communication: None at the bedside  Patient status:Inpatient  Patient is from :home  Anticipated discharge WJ:XBJY  Estimated DC date: 1 to 2 days   Consultants: Orthopedics  Procedures:None  Antimicrobials:  Anti-infectives (From admission, onward)    Start     Dose/Rate Route Frequency Ordered Stop   01/11/24 0800  cefTRIAXone (ROCEPHIN) 2 g in sodium chloride 0.9 % 100 mL IVPB        2 g 200 mL/hr over 30 Minutes Intravenous  Every 24 hours 01/10/24 1040     01/10/24 1130  cefTRIAXone (ROCEPHIN) 1 g in sodium chloride 0.9 % 100 mL IVPB        1 g 200 mL/hr over 30 Minutes Intravenous  Once 01/10/24 1040 01/10/24 1230   01/08/24 0800  cefTRIAXone (ROCEPHIN) 1 g in sodium chloride 0.9 % 100 mL IVPB  Status:  Discontinued        1 g 200 mL/hr over 30 Minutes Intravenous Every 24  hours 01/08/24 0705 01/10/24 1040   01/07/24 1000  fluconazole (DIFLUCAN) tablet 100 mg        100 mg Oral Every Sat 01/07/24 0455         Subjective: Patient seen and examined at bedside today.  Hemodynamically stable.  Afebrile.  Lying in bed.  Complains of pain on the left BKA stump.  Left BKA stump looks better, less edematous and less erythematous today.  Kidney function improving.  No bowel movement for last few days.  Objective: Vitals:   01/10/24 2022 01/10/24 2355 01/11/24 0435 01/11/24 0803  BP: (!) 141/70 138/78 (!) 149/81 (!) 154/79  Pulse: 81 83 79 78  Resp: 18 18 18    Temp: 98.7 F (37.1 C) 98.1 F (36.7 C) 97.9 F (36.6 C) 97.9 F (36.6 C)  TempSrc: Oral Oral Oral Oral  SpO2: 96% 94% 95% 94%  Weight:      Height:        Intake/Output Summary (Last 24 hours) at 01/11/2024 1117 Last data filed at 01/11/2024 0900 Gross per 24 hour  Intake 820 ml  Output 3600 ml  Net -2780 ml   Filed Weights   01/07/24 0300  Weight: (!) 173.5 kg    Examination:  General exam: Overall comfortable, not in distress, morbidly obese HEENT: PERRL Respiratory system:  no wheezes or crackles  Cardiovascular system: S1 & S2 heard, RRR.  Gastrointestinal system: Abdomen is nondistended, soft and nontender. Central nervous system: Alert and oriented Extremities: Bilateral BKA, edema/erythema of left BKA stump  Skin: No rashes, no ulcers,no icterus   GU: Foley   Data Reviewed: I have personally reviewed following labs and imaging studies  CBC: Recent Labs  Lab 01/07/24 0339 01/08/24 0655 01/09/24 0607 01/10/24 1024 01/11/24 0607  WBC 16.8* 14.8* 13.2* 12.6* 13.9*  NEUTROABS  --  12.5* 10.5*  --   --   HGB 10.9* 10.3* 10.7* 10.7* 10.8*  HCT 34.8* 32.7* 34.2* 34.1* 34.0*  MCV 85.3 82.6 83.2 84.2 83.1  PLT 226 221 233 285 329   Basic Metabolic Panel: Recent Labs  Lab 01/07/24 0339 01/08/24 0655 01/09/24 0607 01/10/24 1024 01/11/24 0607  NA 132* 137 135 134* 133*   K 4.0 4.1 4.6 4.9 4.8  CL 102 108 106 103 102  CO2 20* 20* 21* 24 22  GLUCOSE 276* 230* 280* 279* 268*  BUN 43* 39* 42* 48* 49*  CREATININE 3.29* 2.80* 2.56* 2.31* 2.05*  CALCIUM 7.6* 8.0* 8.3* 8.4* 8.3*  MG  --  1.7  --   --   --      Recent Results (from the past 240 hours)  Resp panel by RT-PCR (RSV, Flu A&B, Covid) Anterior Nasal Swab     Status: None   Collection Time: 01/07/24 12:51 AM   Specimen: Anterior Nasal Swab  Result Value Ref Range Status   SARS Coronavirus 2 by RT PCR NEGATIVE NEGATIVE Final   Influenza A by PCR NEGATIVE NEGATIVE Final   Influenza B by PCR  NEGATIVE NEGATIVE Final    Comment: (NOTE) The Xpert Xpress SARS-CoV-2/FLU/RSV plus assay is intended as an aid in the diagnosis of influenza from Nasopharyngeal swab specimens and should not be used as a sole basis for treatment. Nasal washings and aspirates are unacceptable for Xpert Xpress SARS-CoV-2/FLU/RSV testing.  Fact Sheet for Patients: BloggerCourse.com  Fact Sheet for Healthcare Providers: SeriousBroker.it  This test is not yet approved or cleared by the [place] FDA and has been authorized for detection and/or diagnosis of SARS-CoV-2 by FDA under an Emergency Use Authorization (EUA). This EUA will remain in effect (meaning this test can be used) for the duration of the COVID-19 declaration under Section 564(b)(1) of the Act, 21 U.S.C. section 360bbb-3(b)(1), unless the authorization is terminated or revoked.     Resp Syncytial Virus by PCR NEGATIVE NEGATIVE Final    Comment: (NOTE) Fact Sheet for Patients: BloggerCourse.com  Fact Sheet for Healthcare Providers: SeriousBroker.it  This test is not yet approved or cleared by the [place] FDA and has been authorized for detection and/or diagnosis of SARS-CoV-2 by FDA under an Emergency Use Authorization (EUA). This EUA will  remain in effect (meaning this test can be used) for the duration of the COVID-19 declaration under Section 564(b)(1) of the Act, 21 U.S.C. section 360bbb-3(b)(1), unless the authorization is terminated or revoked.  Performed at [facility], [address], [place], [name] [address]   Culture, blood (Routine X 2) w Reflex to ID Panel     Status: None (Preliminary result)   Collection Time: 01/08/24  8:20 AM   Specimen: BLOOD LEFT ARM  Result Value Ref Range Status   Specimen Description BLOOD LEFT ARM  Final   Special Requests   Final    BOTTLES DRAWN AEROBIC AND ANAEROBIC Blood Culture results may not be optimal due to an inadequate volume of blood received in culture bottles   Culture   Final    NO GROWTH 3 DAYS Performed at [facility], [address] [place], [name] [address]    Report Status PENDING  Incomplete  Culture, blood (Routine X 2) w Reflex to ID Panel     Status: None (Preliminary result)   Collection Time: 01/08/24  8:23 AM   Specimen: BLOOD RIGHT HAND  Result Value Ref Range Status   Specimen Description BLOOD RIGHT HAND  Final   Special Requests   Final    BOTTLES DRAWN AEROBIC AND ANAEROBIC Blood Culture results may not be optimal due to an inadequate volume of blood received in culture bottles   Culture   Final    NO GROWTH 3 DAYS Performed at [facility], [address] [place], [name] [address]    Report Status PENDING  Incomplete     Radiology Studies: VAS [place] LOWER EXTREMITY VENOUS (DVT) Result Date: 01/09/2024  Lower Venous DVT Study Patient Name:  JEKHI MCNIVEN [facility]  Date of Exam:   01/09/2024 Medical Rec #: [id number]        Accession #:    [id number] Date of Birth: [birth year]        Patient Gender: M Patient Age:   35 years Exam Location:  [facility] Procedure:      VAS [place] LOWER EXTREMITY VENOUS (DVT) Referring Phys: Tilman Neat AMIN --------------------------------------------------------------------------------  Indications:  Swelling, and Edema.  Risk Factors: Immobility obesity. Limitations: Body habitus and BKA. Comparison Study: No significant changes seen since previous exam 11/18/21 Performing Technologist: Shona Simpson  Examination Guidelines:  A complete evaluation includes B-mode imaging, spectral Doppler, color Doppler, and power Doppler as needed of all accessible portions of each vessel. Bilateral testing is considered an integral part of a complete examination. Limited examinations for reoccurring indications may be performed as noted. The reflux portion of the exam is performed with the patient in reverse Trendelenburg.  +-----+---------------+---------+-----------+----------+--------------+ RIGHTCompressibilityPhasicitySpontaneityPropertiesThrombus Aging +-----+---------------+---------+-----------+----------+--------------+ CFV  Full           Yes      Yes                                 +-----+---------------+---------+-----------+----------+--------------+   +---------+---------------+---------+-----------+----------+-------------------+ LEFT     CompressibilityPhasicitySpontaneityPropertiesThrombus Aging      +---------+---------------+---------+-----------+----------+-------------------+ CFV      Full           Yes      Yes                                      +---------+---------------+---------+-----------+----------+-------------------+ SFJ      Full                                                             +---------+---------------+---------+-----------+----------+-------------------+ FV Prox  Full                                                             +---------+---------------+---------+-----------+----------+-------------------+ FV Mid   Full                                                             +---------+---------------+---------+-----------+----------+-------------------+ FV Distal                        Yes                  Not well  visualized +---------+---------------+---------+-----------+----------+-------------------+ PFV      Full                                                             +---------+---------------+---------+-----------+----------+-------------------+ POP      Full           Yes      Yes                                      +---------+---------------+---------+-----------+----------+-------------------+     Summary: RIGHT: - No evidence of common femoral vein obstruction.   LEFT: - There is no evidence of deep vein thrombosis in the lower extremity.  -  No cystic structure found in the popliteal fossa. - Ultrasound characteristics of enlarged lymph nodes noted in the groin.  *See table(s) above for measurements and observations. Electronically signed by Carolynn Sayers on 01/09/2024 at 4:54:13 PM.    Final     Scheduled Meds:  aspirin EC  81 mg Oral Daily   atorvastatin  80 mg Oral Daily   buPROPion  150 mg Oral Daily   carvedilol  12.5 mg Oral BID WC   Chlorhexidine Gluconate Cloth  6 each Topical Daily   clopidogrel  75 mg Oral Daily   escitalopram  10 mg Oral Daily   finasteride  5 mg Oral Daily   fluconazole  100 mg Oral Q Sat   fluticasone  2 spray Each Nare Daily   heparin injection (subcutaneous)  5,000 Units Subcutaneous Q8H   icosapent Ethyl  2 g Oral BID   influenza vac split trivalent PF  0.5 mL Intramuscular Tomorrow-1000   insulin aspart  0-15 Units Subcutaneous TID WC   insulin aspart  0-5 Units Subcutaneous QHS   insulin aspart  7 Units Subcutaneous TID WC   insulin glargine-yfgn  35 Units Subcutaneous BID   ketotifen  1 drop Left Eye BID   lidocaine  1 patch Transdermal Q24H   loratadine  10 mg Oral Daily   pantoprazole  40 mg Oral Daily   polyethylene glycol  17 g Oral Daily   pregabalin  75 mg Oral BID   ranolazine  500 mg Oral BID   senna  1 tablet Oral BID   tamsulosin  0.4 mg Oral Daily   Continuous Infusions:  cefTRIAXone (ROCEPHIN)  IV 2 g (01/11/24 0904)      LOS: 2 days   Burnadette Pop, MD [facility] P1/22/2025, 11:17 AM

## 2024-01-11 NOTE — Plan of Care (Signed)
  Problem: Coping: Goal: Ability to adjust to condition or change in health will improve Outcome: Progressing   Problem: Fluid Volume: Goal: Ability to maintain a balanced intake and output will improve Outcome: Progressing   Problem: Health Behavior/Discharge Planning: Goal: Ability to identify and utilize available resources and services will improve Outcome: Progressing   Problem: Health Behavior/Discharge Planning: Goal: Ability to manage health-related needs will improve Outcome: Progressing   Problem: Metabolic: Goal: Ability to maintain appropriate glucose levels will improve Outcome: Progressing   Problem: Nutrition: Goal: Adequate nutrition will be maintained Outcome: Progressing

## 2024-01-11 NOTE — TOC Initial Note (Signed)
Transition of Care [facility]) - Initial/Assessment Note    Patient Details  Name: Joseph [name] MRN: [id number] Date of Birth: 07/22/1971  [facility] CM/SW Contact:    Kermit Balo, RN Phone Number: 01/11/2024, 4:15 PM  Clinical Narrative:                  Pt is from home with his spouse that is under chemo treatments.  Pt uses [employer].  Pt manages his own medications and denies any issues except he states he brought his newly filled medications to the hospital and they were taken on admission. Pt asking for new manual wheelchair as his is broken. CM has sent orders to [facility].  Pt will need transportation home at d/c.  TOC following.  Expected Discharge Plan: Home/Self Care Barriers to Discharge: Continued Medical Work up   Patient Goals and CMS Choice   CMS Medicare.gov Compare Post Acute Care list provided to:: Patient Choice offered to / list presented to : Patient      Expected Discharge Plan and Services   Discharge Planning Services: CM Consult Post Acute Care Choice: Durable Medical Equipment Living arrangements for the past 2 months: Single Family Home                 DME Arranged: Wheelchair manual DME Agency: [facility] Date DME Agency Contacted: 01/11/24   Representative spoke with at DME Agency: Zack            Prior Living Arrangements/Services Living arrangements for the past 2 months: Single Family Home Lives with:: Spouse Patient language and need for interpreter reviewed:: Yes Do you feel safe going back to the place where you live?: Yes          Current home services: DME Psychologist, counselling) Criminal Activity/Legal Involvement Pertinent to Current Situation/Hospitalization: No - Comment as needed  Activities of Daily Living   ADL Screening (condition at time of admission) Independently performs ADLs?: No Does the patient have a NEW difficulty with bathing/dressing/toileting/self-feeding that  is expected to last >3 days?: No Does the patient have a NEW difficulty with getting in/out of bed, walking, or climbing stairs that is expected to last >3 days?: No Does the patient have a NEW difficulty with communication that is expected to last >3 days?: No Is the patient deaf or have difficulty hearing?: No Does the patient have difficulty seeing, even when wearing glasses/contacts?: Yes Does the patient have difficulty concentrating, remembering, or making decisions?: No  Permission Sought/Granted                  Emotional Assessment Appearance:: Appears stated age Attitude/Demeanor/Rapport: Engaged Affect (typically observed): Accepting Orientation: : Oriented to Self, Oriented to  Time, Oriented to Situation, Oriented to Place   Psych Involvement: No (comment)  Admission diagnosis:  Acute kidney failure (HCC) [N17.9] Acute renal failure, unspecified acute renal failure type [facility]) [N17.9] Patient Active Problem List   Diagnosis Date Noted   Acute kidney failure (HCC) 01/09/2024   Acute urinary retention 01/08/2024   S/P bilateral BKA (below knee amputation) (HCC) 01/07/2024   Morbid obesity with BMI of 70 and over, adult (HCC) 11/28/2023   Aortic atherosclerosis (HCC) 11/28/2023   CKD (chronic kidney disease), stage III (HCC) 11/28/2023   Weakness 04/28/2023   Nephrosis 04/27/2023   OSA (obstructive sleep apnea) 04/26/2023   Hx of iron deficiency anemia 07/07/2022   Below-knee amputation of left lower extremity (HCC)    Bipolar  disorder with depression (HCC)    Dyspnea 11/18/2021   Bicuspid aortic valve 09/14/2021   Aneurysm of ascending aorta (HCC) 09/14/2021   DM (diabetes mellitus) with peripheral vascular complication (HCC) 08/20/2021   Essential hypertension 07/17/2021   Cellulitis of left lower leg 04/11/2021   Heart failure with preserved ejection fraction (HCC) 07/22/2020   Acute renal failure superimposed on stage 3a chronic kidney disease (HCC)  07/18/2020   Leukocytosis 05/31/2019   Smoker 03/08/2019   Chronic back pain 02/16/2019   BMI 70 and over, adult (HCC) 02/16/2019   Peripheral nerve disease 02/16/2019   COPD (chronic obstructive pulmonary disease) (HCC) 01/22/2019   Anxiety and depression 03/26/2018   Recurrent chest pain 07/29/2016   Hyperglycemia    Insulin dependent type 2 diabetes mellitus (HCC) 04/29/2015   History of pulmonary embolus (PE) 04/27/2013   CAD (coronary artery disease) 09/10/2008   PCP:  Cain Saupe, MD Pharmacy:   [id number] [place], [place] - [address] AT [facility] [address] & [address] [place] [address] Phone: [phone] Fax: [phone]  [facility] [place] [place] [address] [place] [address] Phone: [phone] Fax: [phone]     Social Drivers of Health (SDOH) Social History: SDOH Screenings   Food Insecurity: No Food Insecurity (01/07/2024)  Housing: Low Risk  (01/07/2024)  Transportation Needs: No Transportation Needs (01/07/2024)  Recent Concern: Transportation Needs - Unmet Transportation Needs (11/26/2023)  Utilities: Not At Risk (01/07/2024)  Depression (PHQ2-9): Low Risk  (05/06/2021)  Financial Resource Strain: Medium Risk (09/09/2022)  Social Connections: Moderately Isolated (01/07/2024)  Stress: No Stress Concern Present (10/10/2023)   Received from [facility]  Tobacco Use: Medium Risk (01/07/2024)   SDOH Interventions:     Readmission Risk Interventions    05/03/2023   11:58 AM 11/24/2021    4:05 PM 08/20/2021   12:26 PM  Readmission Risk Prevention Plan  Transportation Screening Complete Complete Complete  PCP or Specialist Appt within 3-5 Days Complete    HRI or Home Care Consult Complete    Social Work Consult for Recovery Care Planning/Counseling Complete    Palliative Care Screening Not Applicable    Medication Review Oceanographer) Referral to  Pharmacy Complete Complete  PCP or Specialist appointment within 3-5 days of discharge  Complete Complete  HRI or Home Care Consult  Complete Complete  SW Recovery Care/Counseling Consult  Complete Complete  Palliative Care Screening  Not Applicable Not Applicable  Skilled Nursing Facility  Complete Not Applicable

## 2024-01-11 NOTE — Consult Note (Signed)
ORTHOPAEDIC CONSULTATION  REQUESTING PHYSICIAN: Burnadette Pop, MD  Chief Complaint: Cellulitis left BKA.  HPI: Joseph [name] is a 53 y.o. male who presents with cellulitis left BKA.  Patient is status post a left BKA January 2023 and status post a right BKA July 2023.  Patient denies any recent trauma.  Past Medical History:  Diagnosis Date   Aneurysm of ascending aorta (HCC) 09/14/2021   Anxiety    Aortic atherosclerosis (HCC) 11/28/2023   Arthritis    "knees, shoulders, hips, ankles" (07/29/2016)   Asthma    Bicuspid aortic valve 09/14/2021   BMI 70 and over, adult (HCC) 02/16/2019   CAD (coronary artery disease)    a. 2017: s/p BMS to distal Cx; b. LHC 05/30/2019: 80% mid, distal RCA s/p DES, 30% narrowing of d LM, widely patent LAD w/ luminal irregularities, widely patent stent in dCX  w/ 90+% stenosis distal to stent beofre small trifurcating obtuse marginal (potentially area of restenosis)   Chronic bronchitis (HCC)    Chronic ulcer of great toe of right foot (HCC) 09/09/2017   Chronic ulcer of right great toe (HCC) 09/19/2017   COPD (chronic obstructive pulmonary disease) (HCC)    Coronary arteriosclerosis    Deep venous thrombosis (HCC) 02/16/2019   Depression    Diabetes mellitus (HCC)    DVT (deep venous thrombosis) (HCC)    GERD (gastroesophageal reflux disease)    Hyperlipidemia    Hypertension    Migraine    Morbid obesity (HCC)    Morbid obesity with BMI of 70 and over, adult (HCC) 11/28/2023   Myocardial infarction (HCC) 1996   "light one"   Non-ST elevation (NSTEMI) myocardial infarction (HCC) 07/22/2020   PE (pulmonary embolism) 04/2013   On chronic Xarelto   Peripheral nerve disease    Pneumonia "several times"   Sleep apnea    Type II diabetes mellitus (HCC)    Past Surgical History:  Procedure Laterality Date   ABDOMINAL AORTOGRAM W/LOWER EXTREMITY N/A 10/10/2020   Procedure: ABDOMINAL AORTOGRAM W/LOWER EXTREMITY;  Surgeon: Sherren Kerns,  MD;  [facility]: [facility];  Service: Cardiovascular;  Laterality: N/A;   AMPUTATION Right 09/21/2017   Procedure: RIGHT GREAT TOE AMPUTATION, POSSIBLE VAC;  Surgeon: Tarry Kos, MD;  Location: [facility] OR;  Service: Orthopedics;  Laterality: Right;   AMPUTATION Left 08/13/2020   Procedure: LEFT FOOT 4TH RAY AMPUTATION;  Surgeon: [name], MD;  Location: [facility] OR;  Service: Orthopedics;  Laterality: Left;   AMPUTATION Left 11/20/2021   Procedure: LEFT TRANSMETATARSAL AMPUTATION;  Surgeon: [name], MD;  Location: [facility] OR;  Service: Orthopedics;  Laterality: Left;   AMPUTATION Left 01/08/2022   Procedure: LEFT BELOW KNEE AMPUTATION;  Surgeon: [name], MD;  Location: [facility] OR;  Service: Orthopedics;  Laterality: Left;   AMPUTATION Right 07/07/2022   Procedure: RIGHT BELOW KNEE AMPUTATION;  Surgeon: [name], MD;  Location: [facility] OR;  Service: Orthopedics;  Laterality: Right;   AORTOGRAM Bilateral 03/13/2021   Procedure: ABDOMINAL AORTOGRAM WITH Left LOWER EXTREMITY RUNOFF;  Surgeon: Leonie Douglas, MD;  Location: [facility] OR;  Service: Vascular;  Laterality: Bilateral;   APPLICATION OF WOUND VAC  01/08/2022   Procedure: APPLICATION OF WOUND VAC;  Surgeon: [name], MD;  Location: [facility] OR;  Service: Orthopedics;;   APPLICATION OF WOUND VAC Right 07/07/2022   Procedure: APPLICATION OF WOUND VAC;  Surgeon: [name], MD;  [facility];  Service: Orthopedics;  Laterality:  Right;   CARDIAC CATHETERIZATION  2006   CARDIAC CATHETERIZATION  1996   "@ [facility]; when I had my heart attack"   CARDIAC CATHETERIZATION N/A 07/29/2016   Procedure: Left Heart Cath and Coronary Angiography;  Surgeon: Runell Gess, MD;  Location: [facility] INVASIVE CV LAB;  Service: Cardiovascular;  Laterality: N/A;   CARDIAC CATHETERIZATION N/A 07/29/2016   Procedure: Coronary Stent Intervention;  Surgeon: Runell Gess, MD;  [facility]: [facility];  Service: Cardiovascular;  Laterality: N/A;   CARPAL TUNNEL  RELEASE Bilateral    CORONARY ANGIOPLASTY WITH STENT PLACEMENT  07/29/2016   CORONARY ARTERY BYPASS GRAFT N/A 07/24/2020   Procedure: CORONARY ARTERY BYPASS GRAFTING (CABG), ON PUMP, TIMES FOUR, USING LEFT INTERNAL MAMMARY ARTERY AND ENDOSCOPICALLY HARVESTED RIGHT GREATER SAPHENOUS VEIN;  Surgeon: Corliss Skains, MD;  Location: [facility] OR;  Service: Open Heart Surgery;  Laterality: N/A;  FLOW TAC   CORONARY PRESSURE/FFR STUDY N/A 07/22/2020   Procedure: INTRAVASCULAR PRESSURE WIRE/FFR STUDY;  Surgeon: Marykay Lex, MD;  Location: [facility] INVASIVE CV LAB;  Service: Cardiovascular;  Laterality: N/A;   CORONARY STENT INTERVENTION N/A 05/30/2019   Procedure: CORONARY STENT INTERVENTION;  Surgeon: Lyn Records, MD;  [facility]: [facility];  Service: Cardiovascular;  Laterality: N/A;   CORONARY STENT INTERVENTION N/A 11/02/2019   Procedure: CORONARY STENT INTERVENTION;  Surgeon: Iran Ouch, MD;  [facility];  Service: Cardiovascular;  Laterality: N/A;   ESOPHAGOGASTRODUODENOSCOPY N/A 09/22/2017   Procedure: ESOPHAGOGASTRODUODENOSCOPY (EGD);  Surgeon: Meryl Dare, MD;  Location: [facility] ENDOSCOPY;  Service: Endoscopy;  Laterality: N/A;   KNEE ARTHROSCOPY Bilateral    "2 on left; 1 on the right"   LEFT HEART CATH AND CORONARY ANGIOGRAPHY N/A 11/02/2019   Procedure: LEFT HEART CATH AND CORONARY ANGIOGRAPHY;  Surgeon: Iran Ouch, MD;  [facility];  Service: Cardiovascular;  Laterality: N/A;   LEFT HEART CATH AND CORONARY ANGIOGRAPHY N/A 07/22/2020   Procedure: LEFT HEART CATH AND CORONARY ANGIOGRAPHY;  Surgeon: Marykay Lex, MD;  Location: [facility] INVASIVE CV LAB;  Service: Cardiovascular;  Laterality: N/A;   LEFT HEART CATH AND CORS/GRAFTS ANGIOGRAPHY N/A 08/17/2021   Procedure: LEFT HEART CATH AND CORS/GRAFTS ANGIOGRAPHY;  Surgeon: Yvonne Kendall, MD;  [facility];  Service: Cardiovascular;  Laterality: N/A;   PERIPHERAL VASCULAR  INTERVENTION Left 10/11/2020   popliteal and SFA stent placement    PERIPHERAL VASCULAR INTERVENTION Left 10/10/2020   Procedure: PERIPHERAL VASCULAR INTERVENTION;  Surgeon: Sherren Kerns, MD;  [facility];  Service: Cardiovascular;  Laterality: Left;   RIGHT/LEFT HEART CATH AND CORONARY ANGIOGRAPHY N/A 05/30/2019   Procedure: RIGHT/LEFT HEART CATH AND CORONARY ANGIOGRAPHY;  Surgeon: Lyn Records, MD;  [facility];  Service: Cardiovascular;  Laterality: N/A;   RIGHT/LEFT HEART CATH AND CORONARY/GRAFT ANGIOGRAPHY N/A 06/17/2022   Procedure: RIGHT/LEFT HEART CATH AND CORONARY/GRAFT ANGIOGRAPHY;  Surgeon: Lennette Bihari, MD;  [facility];  Service: Cardiovascular;  Laterality: N/A;   SHOULDER OPEN ROTATOR CUFF REPAIR Bilateral    TEE WITHOUT CARDIOVERSION N/A 07/24/2020   Procedure: TRANSESOPHAGEAL ECHOCARDIOGRAM (TEE);  Surgeon: Corliss Skains, MD;  Location: [facility] OR;  Service: Open Heart Surgery;  Laterality: N/A;   Social History   Socioeconomic History   Marital status: Married    Spouse name: Not on file   Number of children: Not on file   Years of education: Not on file  Highest education level: Not on file  Occupational History   Occupation: disabled     Employer: UNEMPLOYED  Tobacco Use   Smoking status: Former    Current packs/day: 0.00    Average packs/day: 1 pack/day for 34.0 years (34.0 ttl pk-yrs)    Types: Cigarettes    Start date: 07/12/1986    Quit date: 07/12/2020    Years since quitting: 3.5   Smokeless tobacco: Former    Types: Snuff, Chew  Vaping Use   Vaping status: Never Used  Substance and Sexual Activity   Alcohol use: Yes    Comment: RARE   Drug use: No   Sexual activity: Yes  Other Topics Concern   Not on file  Social History Narrative   Not on file   Social Drivers of Health   Financial Resource Strain: Medium Risk (09/09/2022)   Overall Financial Resource Strain (CARDIA)    Difficulty of  Paying Living Expenses: Somewhat hard  Food Insecurity: No Food Insecurity (01/07/2024)   Hunger Vital Sign    Worried About Running Out of Food in the Last Year: Never true    Ran Out of Food in the Last Year: Never true  Transportation Needs: No Transportation Needs (01/07/2024)   PRAPARE - Administrator, Civil Service (Medical): No    Lack of Transportation (Non-Medical): No  Recent Concern: Transportation Needs - Unmet Transportation Needs (11/26/2023)   PRAPARE - Transportation    Lack of Transportation (Medical): Yes    Lack of Transportation (Non-Medical): Yes  Physical Activity: Not on file  Stress: No Stress Concern Present (10/10/2023)   Received from [facility] Occupational [facility] - Occupational Stress Questionnaire    Feeling of Stress : Not at all  Social Connections: Moderately Isolated (01/07/2024)   Social Connection and Isolation Panel [NHANES]    Frequency of Communication with Friends and Family: More than three times a week    Frequency of Social Gatherings with Friends and Family: More than three times a week    Attends Religious Services: Never    Database administrator or Organizations: No    Attends Engineer, structural: Never    Marital Status: Married   Family History  Problem Relation Age of Onset   Hypertension Brother    Hypertension Father    Diabetes Other    Hyperlipidemia Other    - negative except otherwise stated in the family history section Allergies  Allergen Reactions   Sulfa Antibiotics Other (See Comments)    Headaches    Prior to Admission medications   Medication Sig Start Date End Date Taking? Authorizing Provider  acetaminophen (TYLENOL) 500 MG tablet Take 1,000 mg by mouth 3 (three) times daily as needed for headache.   Yes [provider]  albuterol (VENTOLIN HFA) 108 (90 Base) MCG/ACT inhaler Inhale 2 puffs into the lungs every 6 (six) hours as needed for shortness of  breath.   Yes [provider]  ALPRAZolam Prudy Feeler) 1 MG tablet Take 1 tablet (1 mg total) by mouth 2 (two) times daily as needed. Patient taking differently: Take 1 mg by mouth 2 (two) times daily as needed for anxiety or sleep. 12/06/23  Yes   aspirin EC 81 MG tablet Take 81 mg by mouth in the morning. Swallow whole.   Yes [provider]  atorvastatin (LIPITOR) 80 MG tablet TAKE ONE TABLET (80 MG TOTAL) BY MOUTH DAILY AT 6PM APPOINTMENT OVERDUE: NEEDS APPOINTMENT  FOR FURTHER REFILLS PLEASE CALL OFFICE Patient taking differently: Take 80 mg by mouth daily. 11/25/23  Yes Weaver, Scott T, PA-C  buPROPion (WELLBUTRIN XL) 150 MG 24 hr tablet Take 150 mg by mouth in the morning and at bedtime.   Yes [provider]  carvedilol (COREG) 6.25 MG tablet TAKE ONE TABLET (6.25 MG TOTAL) BY MOUTH TWICE DAILY @9AM -5PM Patient taking differently: Take 6.25 mg by mouth 2 (two) times daily with a meal. 11/25/23  Yes Weaver, Scott T, PA-C  clopidogrel (PLAVIX) 75 MG tablet TAKE ONE TABLET (75 MG TOTAL) BY MOUTH DAILY AT 9AM WITH BREAKFAST APPOINTMENT OVERDUE: NEEDS APPOINTMENT FOR FURTHER REFILLS, PLEASE CALL OFFICE Patient taking differently: Take 75 mg by mouth daily. 11/25/23  Yes Weaver, Scott T, PA-C  dapagliflozin propanediol (FARXIGA) 10 MG TABS tablet Take 1 tablet (10 mg total) by mouth daily. 12/06/23  Yes Weaver, Scott T, PA-C  escitalopram (LEXAPRO) 10 MG tablet Take 10 mg by mouth daily. 01/27/22  Yes [provider]  fluconazole (DIFLUCAN) 100 MG tablet Take 100 mg by mouth every Saturday.   Yes [provider]  fluticasone (FLONASE) 50 MCG/ACT nasal spray Place 2 sprays into both nostrils daily.   Yes [provider]  icosapent Ethyl (VASCEPA) 1 g capsule Take 2 capsules (2 g total) by mouth 2 (two) times daily. 10/27/23  Yes Weaver, Scott T, PA-C  insulin aspart (NOVOLOG) 100 UNIT/ML injection Inject 3 Units into the skin 3 (three) times daily with  meals. 07/13/22  Yes Dahal, Melina Schools, MD  insulin glargine (LANTUS SOLOSTAR) 100 UNIT/ML Solostar Pen Inject 35 Units into the skin 2 (two) times daily. 05/03/23  Yes Dameron, Nolberto Hanlon, DO  ketotifen (ZADITOR) 0.035 % ophthalmic solution Place 1 drop into the left eye 2 (two) times daily. 12/03/23  Yes Rolly Salter, MD  lidocaine (LIDODERM) 5 % Place 1 patch onto the skin daily.   Yes [provider]  loratadine (CLARITIN) 10 MG tablet Take 10 mg by mouth daily. 09/27/22  Yes [provider]  metFORMIN (GLUCOPHAGE) 1000 MG tablet Take 1 tablet (1,000 mg total) by mouth 2 (two) times daily with a meal. 11/05/19  Yes Leone Brand, NP  naloxone [facility]) nasal spray 4 mg/0.1 mL Place 4 mg into the nose as needed (overdose).   Yes [provider]  nitroGLYCERIN (NITROSTAT) 0.4 MG SL tablet Place 1 tablet (0.4 mg total) under the tongue every 5 (five) minutes as needed. Patient taking differently: Place 0.4 mg under the tongue every 5 (five) minutes as needed for chest pain. 10/08/22  Yes Gaston Islam., NP  oxyCODONE (ROXICODONE) 15 MG immediate release tablet Take 1 tablet (15 mg total) by mouth every 6 (six) hours as needed. 01/03/24  Yes   pantoprazole (PROTONIX) 40 MG tablet Take 1 tablet (40 mg total) by mouth daily. 09/23/17  Yes Hongalgi, Maximino Greenland, MD  pregabalin (LYRICA) 150 MG capsule Take 150 mg by mouth 3 (three) times daily. 09/10/22  Yes [provider]  promethazine (PHENERGAN) 25 MG tablet Take 25 mg by mouth every 4 (four) hours as needed for nausea or vomiting. 09/10/22  Yes [provider]  Continuous Blood Gluc Sensor (FREESTYLE LIBRE 14 DAY SENSOR) MISC Apply topically 4 (four) times daily. 09/23/22   [provider]  Insulin Pen Needle 32G X 4 MM MISC Use as directed with insulin pens 05/03/23   Dameron, Nolberto Hanlon, DO  ranolazine (RANEXA) 500 MG 12 hr tablet Take 1  tablet (500 mg total) by mouth 2 (two) times daily. 10/27/23   Tereso Newcomer  T, PA-C  tamsulosin (FLOMAX) 0.4 MG CAPS capsule Take 1 capsule (0.4 mg total) by mouth daily. 12/04/23   Rolly Salter, MD  tiZANidine (ZANAFLEX) 4 MG tablet Take 4 mg by mouth 3 (three) times daily. 11/25/23   [provider]   VAS [place] LOWER EXTREMITY VENOUS (DVT) Result Date: 01/09/2024  Lower Venous DVT Study Patient Name:  SATCHEL GRENDA [facility]  Date of Exam:   01/09/2024 Medical Rec #: [id number]        Accession #:    [id number] Date of Birth: 09-26-1971        Patient Gender: M Patient Age:   57 years Exam Location:  [facility] Procedure:      VAS [place] LOWER EXTREMITY VENOUS (DVT) Referring Phys: Tilman Neat AMIN --------------------------------------------------------------------------------  Indications: Swelling, and Edema.  Risk Factors: Immobility obesity. Limitations: Body habitus and BKA. Comparison Study: No significant changes seen since previous exam 11/18/21 Performing Technologist: Shona Simpson  Examination Guidelines: A complete evaluation includes B-mode imaging, spectral Doppler, color Doppler, and power Doppler as needed of all accessible portions of each vessel. Bilateral testing is considered an integral part of a complete examination. Limited examinations for reoccurring indications may be performed as noted. The reflux portion of the exam is performed with the patient in reverse Trendelenburg.  +-----+---------------+---------+-----------+----------+--------------+ RIGHTCompressibilityPhasicitySpontaneityPropertiesThrombus Aging +-----+---------------+---------+-----------+----------+--------------+ CFV  Full           Yes      Yes                                 +-----+---------------+---------+-----------+----------+--------------+   +---------+---------------+---------+-----------+----------+-------------------+ LEFT     CompressibilityPhasicitySpontaneityPropertiesThrombus Aging       +---------+---------------+---------+-----------+----------+-------------------+ CFV      Full           Yes      Yes                                      +---------+---------------+---------+-----------+----------+-------------------+ SFJ      Full                                                             +---------+---------------+---------+-----------+----------+-------------------+ FV Prox  Full                                                             +---------+---------------+---------+-----------+----------+-------------------+ FV Mid   Full                                                             +---------+---------------+---------+-----------+----------+-------------------+ FV Distal  Yes                  Not well visualized +---------+---------------+---------+-----------+----------+-------------------+ PFV      Full                                                             +---------+---------------+---------+-----------+----------+-------------------+ POP      Full           Yes      Yes                                      +---------+---------------+---------+-----------+----------+-------------------+     Summary: RIGHT: - No evidence of common femoral vein obstruction.   LEFT: - There is no evidence of deep vein thrombosis in the lower extremity.  - No cystic structure found in the popliteal fossa. - Ultrasound characteristics of enlarged lymph nodes noted in the groin.  *See table(s) above for measurements and observations. Electronically signed by Carolynn Sayers on 01/09/2024 at 4:54:13 PM.    Final    - pertinent xrays, CT, MRI studies were reviewed and independently interpreted  Positive ROS: All other systems have been reviewed and were otherwise negative with the exception of those mentioned in the HPI and as above.  Physical Exam: General: Alert, no acute distress Psychiatric: Patient is competent for  consent with normal mood and affect Lymphatic: No axillary or cervical lymphadenopathy Cardiovascular: No pedal edema Respiratory: No cyanosis, no use of accessory musculature GI: No organomegaly, abdomen is soft and non-tender    Images:  @ENCIMAGES @  Labs:  Lab Results  Component Value Date   HGBA1C 8.1 (H) 01/07/2024   HGBA1C 8.0 (H) 11/27/2023   HGBA1C 7.4 (H) 04/26/2023   ESRSEDRATE 62 (H) 07/01/2022   ESRSEDRATE 45 (H) 11/18/2021   ESRSEDRATE 41 (H) 09/16/2017   CRP 12.0 (H) 07/01/2022   CRP 12.1 (H) 11/18/2021   CRP 8.9 (H) 09/16/2017   REPTSTATUS PENDING 01/08/2024   CULT  01/08/2024    NO GROWTH 2 DAYS Performed at [facility], [address], [place], [name] [address]    LABORGA KLEBSIELLA OXYTOCA (A) 11/26/2023    Lab Results  Component Value Date   ALBUMIN 1.7 (L) 01/09/2024   ALBUMIN 1.6 (L) 01/08/2024   ALBUMIN 3.1 (L) 11/26/2023   PREALBUMIN 16.5 (L) 01/07/2022        Latest Ref Rng & Units 01/11/2024    6:07 AM 01/10/2024   10:24 AM 01/09/2024    6:07 AM  CBC EXTENDED  WBC 4.0 - 10.5 K/uL 13.9  12.6  13.2   RBC 4.22 - 5.81 MIL/uL 4.09  4.05  4.11   Hemoglobin 13.0 - 17.0 g/dL 72.5  36.6  44.0   HCT 39.0 - 52.0 % 34.0  34.1  34.2   Platelets 150 - 400 K/uL 329  285  233   NEUT# 1.7 - 7.7 K/uL   10.5   Lymph# 0.7 - 4.0 K/uL   1.1     Neurologic: Patient does not have protective sensation bilateral lower extremities.   MUSCULOSKELETAL:   Skin: Examination of the right below-knee amputation there is no cellulitis no induration no open ulcers.  The residual limb  is well consolidated.  Examination of the left below-knee amputation there is induration and cellulitis of the entire residual limb.  There is no fluctuant abscess no drainage no signs of focal infection.  White cell count 13.9.  White cell count was 12.6 on admission.  Hemoglobin stable at 10.8.  Absolute neutrophil count 10.5 and lymphocyte count 1.1.  Albumin 1.7.  No  ascending cellulitis.  Assessment: Assessment: Acute cellulitis left below-knee amputation.  Plan: Plan: Continue IV antibiotics.  Anticipate this should resolve without surgical intervention.  Thank you for the consult and the opportunity to see Mr. Rain Curl, MD [facility] [phone] 8:48 AM

## 2024-01-12 DIAGNOSIS — N1831 Chronic kidney disease, stage 3a: Secondary | ICD-10-CM | POA: Diagnosis not present

## 2024-01-12 DIAGNOSIS — N179 Acute kidney failure, unspecified: Secondary | ICD-10-CM | POA: Diagnosis not present

## 2024-01-12 LAB — BASIC METABOLIC PANEL
Anion gap: 10 (ref 5–15)
BUN: 55 mg/dL — ABNORMAL HIGH (ref 6–20)
CO2: 21 mmol/L — ABNORMAL LOW (ref 22–32)
Calcium: 8.4 mg/dL — ABNORMAL LOW (ref 8.9–10.3)
Chloride: 104 mmol/L (ref 98–111)
Creatinine, Ser: 2.17 mg/dL — ABNORMAL HIGH (ref 0.61–1.24)
GFR, Estimated: 36 mL/min — ABNORMAL LOW (ref >60)
Glucose, Bld: 202 mg/dL — ABNORMAL HIGH (ref 70–99)
Potassium: 5.1 mmol/L (ref 3.5–5.1)
Sodium: 135 mmol/L (ref 135–145)

## 2024-01-12 LAB — CBC
HCT: 34.4 % — ABNORMAL LOW (ref 39.0–52.0)
Hemoglobin: 11 g/dL — ABNORMAL LOW (ref 13.0–17.0)
MCH: 26.3 pg (ref 26.0–34.0)
MCHC: 32 g/dL (ref 30.0–36.0)
MCV: 82.3 fL (ref 80.0–100.0)
Platelets: 352 10*3/uL (ref 150–400)
RBC: 4.18 MIL/uL — ABNORMAL LOW (ref 4.22–5.81)
RDW: 15 % (ref 11.5–15.5)
WBC: 16.1 10*3/uL — ABNORMAL HIGH (ref 4.0–10.5)
nRBC: 0 % (ref 0.0–0.2)

## 2024-01-12 LAB — GLUCOSE, CAPILLARY
Glucose-Capillary: 180 mg/dL — ABNORMAL HIGH (ref 70–99)
Glucose-Capillary: 243 mg/dL — ABNORMAL HIGH (ref 70–99)
Glucose-Capillary: 334 mg/dL — ABNORMAL HIGH (ref 70–99)
Glucose-Capillary: 258 mg/dL — ABNORMAL HIGH (ref 70–99)

## 2024-01-12 MED ORDER — VANCOMYCIN HCL 10 G IV SOLR
2500.0000 mg | INTRAVENOUS | Status: DC
  Administered 2024-01-14: 2500 mg via INTRAVENOUS
  Filled 2024-01-12: qty 20

## 2024-01-12 MED ORDER — VANCOMYCIN HCL 10 G IV SOLR
2500.0000 mg | Freq: Once | INTRAVENOUS | Status: AC
  Administered 2024-01-12: 2500 mg via INTRAVENOUS
  Filled 2024-01-12: qty 2500

## 2024-01-12 MED ORDER — CARVEDILOL 3.125 MG PO TABS
3.1250 mg | ORAL_TABLET | Freq: Two times a day (BID) | ORAL | Status: DC
  Administered 2024-01-12 – 2024-01-13 (×3): 3.125 mg via ORAL
  Filled 2024-01-12 (×3): qty 1

## 2024-01-12 NOTE — Progress Notes (Signed)
PROGRESS NOTE  Joseph [name]  VFI:[id number] DOB: July 02, 1971 DOA: 01/07/2024 PCP: Cain Saupe, MD   Brief Narrative: Patient is a 53 year old male with history of morbid obesity, diabetes type 2, hypertension, diastolic CHF, COPD, bilateral lower extremity BKA, CKD stage III with baseline creatinine of 1.7 who presented from home with complaint of fatigue, chest pain, shortness of breath, subjective fever, swelling of left BKA stump.  On presentation, he had leukocytosis with WBC count of 18.6, creatinine of 3.3.  Chest x-ray did not show any acute findings.  Started on ceftriaxone for cellulitis of left BKA stump.  Hospital course remarkable for urinary retention, Foley placed.  Orthopedics consulted for cellulitis of left BKA stump, antibiotics broadened today.  Assessment & Plan:  Principal Problem:   Acute renal failure superimposed on stage 3a chronic kidney disease (HCC) Active Problems:   Acute urinary retention   COPD (chronic obstructive pulmonary disease) (HCC)   Leukocytosis   History of pulmonary embolus (PE)   Insulin dependent type 2 diabetes mellitus (HCC)   Anxiety and depression   Chronic back pain   BMI 70 and over, adult (HCC)   Heart failure with preserved ejection fraction (HCC)   Essential hypertension   Cellulitis of left lower leg   S/P bilateral BKA (below knee amputation) (HCC)   Acute kidney failure (HCC)   AKI on CKD stage IIIa: Baseline creatinine 1.7.  Presented with creatinine of more than 3.  Could be from bladder outlet obstruction.  Status post Foley placement.  Creatinine now has been in the range of 2  Urine retention: Foley placed here.  Added Flomax, Proscar He needs to follow-up with urology as an outpatient.  He has an appointment on Jan 31st.  Most likely will discharge him on Foley catheter  Concern for left stump cellulitis: Presented with leukocytosis but no fever.  Blood cultures have been negative so far.  Currently on ceftriaxone.   Doppler did not show any DVT. Marland KitchenHe complains of pain.  Dr. [name] recommending conservative management with antibiotics,no  plan for surgery.   Left BKA stump still appears tender, erythematous .  Since leukocytosis worsened, we decided to broaden the antibiotics coverage, added vancomycin.   COPD: Currently not on exacerbation.  Hypertension: Continue current medications.  Blood pressure stable  HFpEF: Echo done here showed EF of 55 to 60%.  Diuretics on hold. Takes aspirin, Plavix, Ranexa, statin at home.  Currently appears euvolemic  Chronic back pain: Continue pain management, supportive care  Anxiety/depression: On Wellbutrin, Xanax  Insulin-dependent diabetes type 2: Diabetic coordinator  following.  Takes metformin, Farxiga ,insulin at home.  Both oral meds on  hold due to AKI on CKD.  Continue current insulin regimen.  Insulin being titrated.  Monitor blood sugars.  Diabetic coordinator following  History of PE: Currently not on anticoagulation  History of bilateral BKA/debility: Patient lives with his family at home.  Ambulates with the help of wheelchair.  Anticipate discharge home when ready.  Morbid obesity: BMI of more than 70         DVT prophylaxis:heparin injection 5,000 Units Start: 01/08/24 1400     Code Status: Full Code  Family Communication: None at the bedside  Patient status:Inpatient  Patient is from :home  Anticipated discharge CZ:YSAY  Estimated DC date: 1 to 2 days   Consultants: Orthopedics  Procedures:None  Antimicrobials:  Anti-infectives (From admission, onward)    Start     Dose/Rate Route Frequency Ordered Stop   01/14/24 1000  vancomycin (VANCOCIN) 2,500 mg in sodium chloride 0.9 % 500 mL IVPB        2,500 mg 262.5 mL/hr over 120 Minutes Intravenous Every 48 hours 01/12/24 0845     01/12/24 0930  vancomycin (VANCOCIN) 2,500 mg in sodium chloride 0.9 % 500 mL IVPB        2,500 mg 262.5 mL/hr over 120 Minutes Intravenous  Once  01/12/24 0838     01/11/24 0800  cefTRIAXone (ROCEPHIN) 2 g in sodium chloride 0.9 % 100 mL IVPB        2 g 200 mL/hr over 30 Minutes Intravenous Every 24 hours 01/10/24 1040     01/10/24 1130  cefTRIAXone (ROCEPHIN) 1 g in sodium chloride 0.9 % 100 mL IVPB        1 g 200 mL/hr over 30 Minutes Intravenous  Once 01/10/24 1040 01/10/24 1230   01/08/24 0800  cefTRIAXone (ROCEPHIN) 1 g in sodium chloride 0.9 % 100 mL IVPB  Status:  Discontinued        1 g 200 mL/hr over 30 Minutes Intravenous Every 24 hours 01/08/24 0705 01/10/24 1040   01/07/24 1000  fluconazole (DIFLUCAN) tablet 100 mg        100 mg Oral Every Sat 01/07/24 0455         Subjective: Patient seen and examined at bedside today.  He was sleeping when I arrived.  He appeared comfortable.  He said his pain is well-controlled on the left BKA stump but not entirely gone.  Left BKA stump still appears edematous, erythematous today  Objective: Vitals:   01/11/24 1617 01/11/24 2103 01/12/24 0424 01/12/24 0736  BP: (!) 126/93 125/88 138/83 105/65  Pulse: 85 78 76 77  Resp:  18 18 16   Temp:  98.4 F (36.9 C) 98.1 F (36.7 C) 98.2 F (36.8 C)  TempSrc:      SpO2: 96% 97% 94% (!) 80%  Weight:      Height:        Intake/Output Summary (Last 24 hours) at 01/12/2024 1039 Last data filed at 01/12/2024 0427 Gross per 24 hour  Intake 1.33 ml  Output 1650 ml  Net -1648.67 ml   Filed Weights   01/07/24 0300  Weight: (!) 173.5 kg    Examination:   General exam: Overall comfortable, not in distress, morbidly obese HEENT: PERRL Respiratory system:  no wheezes or crackles  Cardiovascular system: S1 & S2 heard, RRR.  Gastrointestinal system: Abdomen is nondistended, soft and nontender. Central nervous system: Alert and oriented Extremities: Bilateral BKA, edema/erythema of left BKA stump Skin: No rashes, no ulcers,no icterus   GU: Foley   Data Reviewed: I have personally reviewed following labs and imaging  studies  CBC: Recent Labs  Lab 01/08/24 0655 01/09/24 0607 01/10/24 1024 01/11/24 0607 01/12/24 0701  WBC 14.8* 13.2* 12.6* 13.9* 16.1*  NEUTROABS 12.5* 10.5*  --   --   --   HGB 10.3* 10.7* 10.7* 10.8* 11.0*  HCT 32.7* 34.2* 34.1* 34.0* 34.4*  MCV 82.6 83.2 84.2 83.1 82.3  PLT 221 233 285 329 352   Basic Metabolic Panel: Recent Labs  Lab 01/08/24 0655 01/09/24 0607 01/10/24 1024 01/11/24 0607 01/12/24 0701  NA 137 135 134* 133* 135  K 4.1 4.6 4.9 4.8 5.1  CL 108 106 103 102 104  CO2 20* 21* 24 22 21*  GLUCOSE 230* 280* 279* 268* 202*  BUN 39* 42* 48* 49* 55*  CREATININE 2.80* 2.56* 2.31* 2.05* 2.17*  CALCIUM  8.0* 8.3* 8.4* 8.3* 8.4*  MG 1.7  --   --   --   --      Recent Results (from the past 240 hours)  Resp panel by RT-PCR (RSV, Flu A&B, Covid) Anterior Nasal Swab     Status: None   Collection Time: 01/07/24 12:51 AM   Specimen: Anterior Nasal Swab  Result Value Ref Range Status   SARS Coronavirus 2 by RT PCR NEGATIVE NEGATIVE Final   Influenza A by PCR NEGATIVE NEGATIVE Final   Influenza B by PCR NEGATIVE NEGATIVE Final    Comment: (NOTE) The Xpert Xpress SARS-CoV-2/FLU/RSV plus assay is intended as an aid in the diagnosis of influenza from Nasopharyngeal swab specimens and should not be used as a sole basis for treatment. Nasal washings and aspirates are unacceptable for Xpert Xpress SARS-CoV-2/FLU/RSV testing.  Fact Sheet for Patients: BloggerCourse.com  Fact Sheet for Healthcare Providers: SeriousBroker.it  This test is not yet approved or cleared by the [place] FDA and has been authorized for detection and/or diagnosis of SARS-CoV-2 by FDA under an Emergency Use Authorization (EUA). This EUA will remain in effect (meaning this test can be used) for the duration of the COVID-19 declaration under Section 564(b)(1) of the Act, 21 U.S.C. section 360bbb-3(b)(1), unless the authorization is  terminated or revoked.     Resp Syncytial Virus by PCR NEGATIVE NEGATIVE Final    Comment: (NOTE) Fact Sheet for Patients: BloggerCourse.com  Fact Sheet for Healthcare Providers: SeriousBroker.it  This test is not yet approved or cleared by the [place] FDA and has been authorized for detection and/or diagnosis of SARS-CoV-2 by FDA under an Emergency Use Authorization (EUA). This EUA will remain in effect (meaning this test can be used) for the duration of the COVID-19 declaration under Section 564(b)(1) of the Act, 21 U.S.C. section 360bbb-3(b)(1), unless the authorization is terminated or revoked.  Performed at [facility], [address], [place], [name] [address]   Culture, blood (Routine X 2) w Reflex to ID Panel     Status: None (Preliminary result)   Collection Time: 01/08/24  8:20 AM   Specimen: BLOOD LEFT ARM  Result Value Ref Range Status   Specimen Description BLOOD LEFT ARM  Final   Special Requests   Final    BOTTLES DRAWN AEROBIC AND ANAEROBIC Blood Culture results may not be optimal due to an inadequate volume of blood received in culture bottles   Culture   Final    NO GROWTH 3 DAYS Performed at [facility], [address], [place], [name] [address]    Report Status PENDING  Incomplete  Culture, blood (Routine X 2) w Reflex to ID Panel     Status: None (Preliminary result)   Collection Time: 01/08/24  8:23 AM   Specimen: BLOOD RIGHT HAND  Result Value Ref Range Status   Specimen Description BLOOD RIGHT HAND  Final   Special Requests   Final    BOTTLES DRAWN AEROBIC AND ANAEROBIC Blood Culture results may not be optimal due to an inadequate volume of blood received in culture bottles   Culture   Final    NO GROWTH 3 DAYS Performed at [facility], [address] [place], [name] [address]    Report Status PENDING  Incomplete     Radiology Studies: No results  found.   Scheduled Meds:  aspirin EC  81 mg Oral Daily   atorvastatin  80 mg Oral Daily  buPROPion  150 mg Oral Daily   carvedilol  3.125 mg Oral BID WC   Chlorhexidine Gluconate Cloth  6 each Topical Daily   clopidogrel  75 mg Oral Daily   escitalopram  10 mg Oral Daily   finasteride  5 mg Oral Daily   fluconazole  100 mg Oral Q Sat   fluticasone  2 spray Each Nare Daily   heparin injection (subcutaneous)  5,000 Units Subcutaneous Q8H   icosapent Ethyl  2 g Oral BID   influenza vac split trivalent PF  0.5 mL Intramuscular Tomorrow-1000   insulin aspart  0-15 Units Subcutaneous TID WC   insulin aspart  0-5 Units Subcutaneous QHS   insulin aspart  7 Units Subcutaneous TID WC   insulin glargine-yfgn  35 Units Subcutaneous BID   ketotifen  1 drop Left Eye BID   lidocaine  1 patch Transdermal Q24H   loratadine  10 mg Oral Daily   pantoprazole  40 mg Oral Daily   polyethylene glycol  17 g Oral Daily   pregabalin  75 mg Oral BID   ranolazine  500 mg Oral BID   senna  1 tablet Oral BID   tamsulosin  0.4 mg Oral Daily   Continuous Infusions:  cefTRIAXone (ROCEPHIN)  IV 200 mL/hr at 01/11/24 1227   vancomycin     [START ON 01/14/2024] vancomycin       LOS: 3 days   Burnadette Pop, MD [facility] P1/23/2025, 10:39 AM

## 2024-01-12 NOTE — Evaluation (Addendum)
Physical Therapy Evaluation Patient Details Name: BEKA MIKULAK MRN: [id number] DOB: 1971-02-07 Today's Date: 01/12/2024  History of Present Illness  Patient is 53 y.o. male presented with cellulitis of Lt BKA. Pt s/p Lt BKA in Jan '23 and Rt BKA in July '23. PMH significant for anxiety, OA, asthma, CADs/p CABGx4, COPD, DVT, DM, GERD, HTN. HLD, morbid obesity, NSTEMI, PE, anxiety, depression.   Clinical Impression  Diland Novosad Cuoco is 53 y.o. male admitted with above HPI and diagnosis. Patient is currently limited by functional impairments below (see PT problem list). Patient lives with spouse and additional family and is mod independent at baseline to transfer to power wc and mobilize short distances on knees of residual limbs. Pt c/o headache and declining to mobilize to EOB/OOB but agreeable to strength testing. Pt has been mobilizing to/from [facility] with supervision only per pt/RN staff report. Patient will benefit from continued skilled PT interventions to address impairments and progress independence with mobility. Acute PT will follow and progress as able.          If plan is discharge home, recommend the following: A lot of help with walking and/or transfers;A little help with bathing/dressing/bathroom;Assistance with cooking/housework;Assist for transportation;Help with stairs or ramp for entrance   Can travel by private vehicle        Equipment Recommendations None recommended by PT  Recommendations for Other Services       Functional Status Assessment Patient has had a recent decline in their functional status and demonstrates the ability to make significant improvements in function in a reasonable and predictable amount of time.     Precautions / Restrictions Precautions Precautions: Fall Precaution Comments: bil BKA, Lt LE cellulitis/wounds Restrictions Weight Bearing Restrictions Per Provider Order: No      Mobility  Bed Mobility Overal bed mobility: Needs Assistance              General bed mobility comments: pt declining to move EOB today    Transfers                        Ambulation/Gait                  Stairs            Wheelchair Mobility     Tilt Bed    Modified Rankin (Stroke Patients Only)       Balance                                             Pertinent Vitals/Pain Pain Assessment Pain Assessment: Faces Pain Score: 8  Faces Pain Scale: Hurts a little bit Pain Location: headache Pain Descriptors / Indicators: Aching, Headache Pain Intervention(s): Limited activity within patient's tolerance, Monitored during session    Home Living Family/patient expects to be discharged to:: Private residence Living Arrangements: Spouse/significant other (also aunt/uncle) Available Help at Discharge: Family;Available 24 hours/day Type of Home: House Home Access: Ramped entrance       Home Layout: One level Home Equipment: Shower seat;Cane - single point;Rollator (4 wheels);BSC/3in1;Wheelchair - power (wide seat BSC bari, shower seat holds plants,) Additional Comments: wife has stage 4 Breast Ca so pt does not like to bother her for help however she will help him if needed.    Prior Function Prior Level of Function : Needs assist  Mobility Comments: pt has been transfering bed<>wc to mobilize. WC doesn't fit in bathroom so gets to his knees and amb on knees short distance. ADLs Comments: Rt shoulder pain limits him; pt has BSC next to bed so trasfers WC>bed>BSC for toileting. Pt climbs over tub in and out of shower. (unsure last time pt did so) reports >2 weeks since mobilized on knees.     Extremity/Trunk Assessment   Upper Extremity Assessment Upper Extremity Assessment: Overall WFL for tasks assessed (Rt shoulder limitations)    Lower Extremity Assessment Lower Extremity Assessment: Generalized weakness;LLE deficits/detail;RLE deficits/detail RLE Deficits /  Details: Rt residual limb intact with no errythema/edema. pt with 4/5 for hip strength and knee flex/ext. LLE Deficits / Details: Lt residual limb with distal errythema/edema. pain limiting MMT. 4-/5 for hip flex/abd/add    Cervical / Trunk Assessment Cervical / Trunk Assessment: Other exceptions Cervical / Trunk Exceptions: habitus  Communication   Communication Communication: No apparent difficulties  Cognition Arousal: Alert Behavior During Therapy: WFL for tasks assessed/performed Overall Cognitive Status: Within Functional Limits for tasks assessed                                          General Comments      Exercises     Assessment/Plan    PT Assessment Patient needs continued PT services  PT Problem List Decreased strength;Decreased activity tolerance;Decreased balance;Decreased mobility;Decreased knowledge of use of DME;Decreased safety awareness;Obesity;Decreased skin integrity;Pain;Cardiopulmonary status limiting activity       PT Treatment Interventions Neuromuscular re-education;Balance training;Therapeutic exercise;Therapeutic activities;Functional mobility training;Stair training;Gait training;DME instruction;Patient/family education    PT Goals (Current goals can be found in the Care Plan section)  Acute Rehab PT Goals Patient Stated Goal: recover, leg to stop hurting, and return home PT Goal Formulation: With patient Time For Goal Achievement: 01/26/24 Potential to Achieve Goals: Good    Frequency Min 1X/week     Co-evaluation               AM-PAC PT "6 Clicks" Mobility  Outcome Measure Help needed turning from your back to your side while in a flat bed without using bedrails?: A Little Help needed moving from lying on your back to sitting on the side of a flat bed without using bedrails?: A Little Help needed moving to and from a bed to a chair (including a wheelchair)?: A Little Help needed standing up from a chair using your  arms (e.g., wheelchair or bedside chair)?: Total Help needed to walk in hospital room?: Total Help needed climbing 3-5 steps with a railing? : Total 6 Click Score: 12    End of Session Equipment Utilized During Treatment: Gait belt Activity Tolerance: Patient tolerated treatment well Patient left: in bed;with call bell/phone within reach Nurse Communication: Mobility status PT Visit Diagnosis: Muscle weakness (generalized) (M62.81);Difficulty in walking, not elsewhere classified (R26.2);Other abnormalities of gait and mobility (R26.89)    Time: 4098-1191 PT Time Calculation (min) (ACUTE ONLY): 15 min   Charges:   PT Evaluation $PT Eval Low Complexity: 1 Low   PT General Charges $$ ACUTE PT VISIT: 1 Visit         Wynn Maudlin, DPT Acute Rehabilitation Services Office [phone]  01/12/24 12:44 PM

## 2024-01-12 NOTE — Progress Notes (Signed)
Pharmacy Antibiotic Note  Joseph [name] is a 53 y.o. male admitted on 01/07/2024 with AKI on CKD stage 3a,  leukocytosis , initially started on Ceftriaxone 1/18 for cellulitis of left BKA stump.  MD noted leucocytosis worsened only on ceftriaxone thus broadening antibiotic coverage.   Pharmacy has been consulted on 1/23 for Vancomycin for left BKA stump cellulitis.   PMH: morbid obesity, diabetes type 2, hypertension, diastolic CHF, COPD, bilateral lower extremity BKA, CKD stage III with baseline creatinine of 1.7   Plan: Vancomycin 2500 mg IV Q 48 hrs. Goal AUC 400-550. Expected AUC: 490 SCr used: 2.17,  Vd 0.5 L/kg (BMI 72)  Ceftriaxone continues  Monitor clinical progress, renal function, cultures and vanc levels per protocol.    Height: 5\' 1"  (154.9 cm) Weight: (!) 173.5 kg (382 lb 8 oz) (weight from 12/24) IBW/kg (Calculated) : 52.3  Temp (24hrs), Avg:98.2 F (36.8 C), Min:98.1 F (36.7 C), Max:98.4 F (36.9 C)  Recent Labs  Lab 01/08/24 0655 01/09/24 0607 01/10/24 1024 01/11/24 0607 01/12/24 0701  WBC 14.8* 13.2* 12.6* 13.9* 16.1*  CREATININE 2.80* 2.56* 2.31* 2.05* 2.17*    Estimated Creatinine Clearance: 58.1 mL/min (A) (by C-G formula based on SCr of 2.17 mg/dL (H)).    Allergies  Allergen Reactions   Sulfa Antibiotics Other (See Comments)    Headaches     Antimicrobials this admission: Ceftriaxone   1/18 >>  Vanc 1/23>>   Dose adjustments this admission: N/a  Microbiology results: 1/18 Resp panel pcr: negaative 1/19 Bcx:   no growth to day   Thank you for allowing pharmacy to be a part of this patient's care.  Noah Delaine, RPh Clinical Pharmacist 01/12/2024 9:11 AM  Please check AMION for all [facility] Pharmacy phone numbers After 10:00 PM, call Main Pharmacy [phone]

## 2024-01-12 NOTE — Plan of Care (Signed)
  Problem: Education: Goal: Ability to describe self-care measures that may prevent or decrease complications (Diabetes Survival Skills Education) will improve Outcome: Progressing   Problem: Education: Goal: Individualized Educational Video(s) Outcome: Progressing   Problem: Coping: Goal: Ability to adjust to condition or change in health will improve Outcome: Progressing   Problem: Fluid Volume: Goal: Ability to maintain a balanced intake and output will improve Outcome: Progressing

## 2024-01-12 NOTE — Care Management Important Message (Signed)
Important Message  Patient Details  Name: Joseph [name] MRN: [id number] Date of Birth: [birth year]   Important Message Given:  Yes - Medicare IM     Dorena Bodo 01/12/2024, 3:01 PM

## 2024-01-13 DIAGNOSIS — N179 Acute kidney failure, unspecified: Secondary | ICD-10-CM | POA: Diagnosis not present

## 2024-01-13 DIAGNOSIS — N1831 Chronic kidney disease, stage 3a: Secondary | ICD-10-CM | POA: Diagnosis not present

## 2024-01-13 LAB — GLUCOSE, CAPILLARY
Glucose-Capillary: 240 mg/dL — ABNORMAL HIGH (ref 70–99)
Glucose-Capillary: 264 mg/dL — ABNORMAL HIGH (ref 70–99)
Glucose-Capillary: 178 mg/dL — ABNORMAL HIGH (ref 70–99)
Glucose-Capillary: 251 mg/dL — ABNORMAL HIGH (ref 70–99)

## 2024-01-13 LAB — BASIC METABOLIC PANEL
Anion gap: 8 (ref 5–15)
BUN: 54 mg/dL — ABNORMAL HIGH (ref 6–20)
CO2: 22 mmol/L (ref 22–32)
Calcium: 8.2 mg/dL — ABNORMAL LOW (ref 8.9–10.3)
Chloride: 108 mmol/L (ref 98–111)
Creatinine, Ser: 2.25 mg/dL — ABNORMAL HIGH (ref 0.61–1.24)
GFR, Estimated: 35 mL/min — ABNORMAL LOW (ref >60)
Glucose, Bld: 274 mg/dL — ABNORMAL HIGH (ref 70–99)
Potassium: 5 mmol/L (ref 3.5–5.1)
Sodium: 138 mmol/L (ref 135–145)

## 2024-01-13 LAB — CBC
HCT: 34 % — ABNORMAL LOW (ref 39.0–52.0)
Hemoglobin: 10.7 g/dL — ABNORMAL LOW (ref 13.0–17.0)
MCH: 26.2 pg (ref 26.0–34.0)
MCHC: 31.5 g/dL (ref 30.0–36.0)
MCV: 83.3 fL (ref 80.0–100.0)
Platelets: 369 10*3/uL (ref 150–400)
RBC: 4.08 MIL/uL — ABNORMAL LOW (ref 4.22–5.81)
RDW: 15 % (ref 11.5–15.5)
WBC: 12.4 10*3/uL — ABNORMAL HIGH (ref 4.0–10.5)
nRBC: 0 % (ref 0.0–0.2)

## 2024-01-13 LAB — CULTURE, BLOOD (ROUTINE X 2)
Culture: NO GROWTH
Culture: NO GROWTH

## 2024-01-13 MED ORDER — INSULIN ASPART 100 UNIT/ML IJ SOLN
8.0000 [IU] | Freq: Three times a day (TID) | INTRAMUSCULAR | Status: DC
Start: 2024-01-13 — End: 2024-01-16
  Administered 2024-01-13 – 2024-01-16 (×9): 8 [IU] via SUBCUTANEOUS

## 2024-01-13 MED ORDER — OXYCODONE HCL 5 MG PO TABS
10.0000 mg | ORAL_TABLET | Freq: Four times a day (QID) | ORAL | Status: DC | PRN
  Administered 2024-01-13 – 2024-01-14 (×2): 10 mg via ORAL
  Filled 2024-01-13 (×2): qty 2

## 2024-01-13 MED ORDER — INSULIN GLARGINE-YFGN 100 UNIT/ML ~~LOC~~ SOLN
38.0000 [IU] | Freq: Two times a day (BID) | SUBCUTANEOUS | Status: DC
Start: 2024-01-13 — End: 2024-01-16
  Administered 2024-01-13 – 2024-01-16 (×6): 38 [IU] via SUBCUTANEOUS
  Filled 2024-01-13 (×7): qty 0.38

## 2024-01-13 NOTE — Inpatient Diabetes Management (Signed)
Inpatient Diabetes Program Recommendations  AACE/ADA: New Consensus Statement on Inpatient Glycemic Control (2015)  Target Ranges:  Prepandial:   less than 140 mg/dL      Peak postprandial:   less than 180 mg/dL (1-2 hours)      Critically ill patients:  140 - 180 mg/dL   Lab Results  Component Value Date   GLUCAP 240 (H) 01/13/2024   HGBA1C 8.1 (H) 01/07/2024    Review of Glycemic Control  Latest Reference Range & Units 01/12/24 08:18 01/12/24 13:45 01/12/24 15:42 01/12/24 20:52 01/13/24 08:53  Glucose-Capillary 70 - 99 mg/dL 161 (H) 096 (H) 045 (H) 258 (H) 240 (H)  (H): Data is abnormally high  Diabetes history: DM2 Outpatient Diabetes medications: Lantus 35 units BID, Novolog 3 units TID, Farxiga 10 mg every day, Metformin 1000 mg BID Current orders for Inpatient glycemic control: Semglee 35 units BID, Novolog 0-15 units TID and 0-5 units at bedtime, 7 units MCTID  Inpatient Diabetes Program Recommendations:    Please consider:  Semglee 38 units BID Novolog 8 units TID with meals if she consumes at least 50%  Will continue to follow while inpatient.  Thank you, Dulce Sellar, MSN, CDCES Diabetes Coordinator Inpatient Diabetes Program [phone] (team pager from 8a-5p)

## 2024-01-13 NOTE — Progress Notes (Signed)
PROGRESS NOTE  Joseph [name]  ZHY:[id number] DOB: 11/29/1971 DOA: 01/07/2024 PCP: Cain Saupe, MD   Brief Narrative: Patient is a 53 year old male with history of morbid obesity, diabetes type 2, hypertension, diastolic CHF, COPD, bilateral lower extremity BKA, CKD stage III with baseline creatinine of 1.7 who presented from home with complaint of fatigue, chest pain, shortness of breath, subjective fever, swelling of left BKA stump.  On presentation, he had leukocytosis with WBC count of 18.6, creatinine of 3.3.  Chest x-ray did not show any acute findings.  Started on ceftriaxone for cellulitis of left BKA stump.  Hospital course remarkable for urinary retention, Foley placed.  Orthopedics consulted for cellulitis of left BKA stump, antibiotics broadened .  Currently on ceftriaxone and vancomycin  Assessment & Plan:  Principal Problem:   Acute renal failure superimposed on stage 3a chronic kidney disease (HCC) Active Problems:   Acute urinary retention   COPD (chronic obstructive pulmonary disease) (HCC)   Leukocytosis   History of pulmonary embolus (PE)   Insulin dependent type 2 diabetes mellitus (HCC)   Anxiety and depression   Chronic back pain   BMI 70 and over, adult (HCC)   Heart failure with preserved ejection fraction (HCC)   Essential hypertension   Cellulitis of left lower leg   S/P bilateral BKA (below knee amputation) (HCC)   Acute kidney failure (HCC)   AKI on CKD stage IIIa: Baseline creatinine 1.7.  Presented with creatinine of more than 3.  Could be from bladder outlet obstruction.  Status post Foley placement.  Creatinine now has been in the range of 2.  He follows with Dr. Melanee Spry with [facility]  Urine retention: Foley placed here.  Added Flomax, Proscar He needs to follow-up with urology as an outpatient.  He has an appointment on Jan 31st.  Most likely will discharge him on Foley catheter  Concern for left stump cellulitis: Presented with leukocytosis  but no fever.  Blood cultures have been negative so far.  Currently on ceftriaxone.  Doppler did not show any DVT. Marland KitchenHe complains of pain.  Dr. [name] recommending conservative management with antibiotics,no  plan for surgery.   Left BKA stump still appears tender, erythematous .  Since leukocytosis worsened, we decided to broaden the antibiotics coverage, added vancomycin.  Leukocytosis is better today.  COPD: Currently not on exacerbation.  Hypertension: Continue current medications.  Blood pressure stable  HFpEF: Echo done here showed EF of 55 to 60%.  Diuretics on hold. Takes aspirin, Plavix, Ranexa, statin at home.  Currently appears euvolemic  Chronic back pain: Continue pain management, supportive care  Anxiety/depression: On Wellbutrin, Xanax  Insulin-dependent diabetes type 2: Diabetic coordinator  following.  Takes metformin, Farxiga ,insulin at home.  Both oral meds on  hold due to AKI on CKD.  Continue current insulin regimen.  Insulin being titrated.  Monitor blood sugars.  Diabetic coordinator following.  Blood sugars high mostly from noncompliance with diet  History of PE: Currently not on anticoagulation  History of bilateral BKA/debility: Patient lives with his family at home.  Ambulates with the help of wheelchair.  Anticipate discharge home when ready.  Morbid obesity: BMI of more than 70         DVT prophylaxis:heparin injection 5,000 Units Start: 01/08/24 1400     Code Status: Full Code  Family Communication: None at the bedside  Patient status:Inpatient  Patient is from :home  Anticipated discharge EX:[facility]WU  Estimated DC date: tomorrow   Consultants: Orthopedics  Procedures:None  Antimicrobials:  Anti-infectives (From admission, onward)    Start     Dose/Rate Route Frequency Ordered Stop   01/14/24 1000  vancomycin (VANCOCIN) 2,500 mg in sodium chloride 0.9 % 500 mL IVPB        2,500 mg 262.5 mL/hr over 120 Minutes Intravenous Every 48 hours  01/12/24 0845     01/12/24 0930  vancomycin (VANCOCIN) 2,500 mg in sodium chloride 0.9 % 500 mL IVPB        2,500 mg 262.5 mL/hr over 120 Minutes Intravenous  Once 01/12/24 0838 01/12/24 1708   01/11/24 0800  cefTRIAXone (ROCEPHIN) 2 g in sodium chloride 0.9 % 100 mL IVPB        2 g 200 mL/hr over 30 Minutes Intravenous Every 24 hours 01/10/24 1040     01/10/24 1130  cefTRIAXone (ROCEPHIN) 1 g in sodium chloride 0.9 % 100 mL IVPB        1 g 200 mL/hr over 30 Minutes Intravenous  Once 01/10/24 1040 01/10/24 1230   01/08/24 0800  cefTRIAXone (ROCEPHIN) 1 g in sodium chloride 0.9 % 100 mL IVPB  Status:  Discontinued        1 g 200 mL/hr over 30 Minutes Intravenous Every 24 hours 01/08/24 0705 01/10/24 1040   01/07/24 1000  fluconazole (DIFLUCAN) tablet 100 mg        100 mg Oral Every Sat 01/07/24 0455         Subjective: Patient seen and examined at bedside today.  Hemodynamically stable.  Overall comfortable.  Lying in bed.  He was sleeping when I arrived.  He still complains of pain but the pain is better today.  Left BKA stump is still erythematous and slightly edematous.  We discussed about continuing IV antibiotics for today and possible discharge home tomorrow with oral antibiotics  Objective: Vitals:   01/12/24 1412 01/12/24 2048 01/13/24 0440 01/13/24 0855  BP: 124/71 (!) 147/82 (!) 144/83 (!) 166/76  Pulse: 82 82 81 77  Resp: 17 18 18 18   Temp: 98.2 F (36.8 C) 99 F (37.2 C) 97.9 F (36.6 C) 98.1 F (36.7 C)  TempSrc:  Oral Oral   SpO2: 99% 96% 97% 97%  Weight:      Height:        Intake/Output Summary (Last 24 hours) at 01/13/2024 1123 Last data filed at 01/13/2024 0900 Gross per 24 hour  Intake 288.11 ml  Output 4075 ml  Net -3786.89 ml   Filed Weights   01/07/24 0300  Weight: (!) 173.5 kg    Examination:   General exam: Overall comfortable, not in distress, morbidly obese HEENT: PERRL Respiratory system:  no wheezes or crackles  Cardiovascular system:  S1 & S2 heard, RRR.  Gastrointestinal system: Abdomen is nondistended, soft and nontender. Central nervous system: Alert and oriented Extremities: Bilateral BKA, edema/erythema of left BKA stump Skin: No rashes, no ulcers,no icterus   GU: Foley   Data Reviewed: I have personally reviewed following labs and imaging studies  CBC: Recent Labs  Lab 01/08/24 0655 01/09/24 0607 01/10/24 1024 01/11/24 0607 01/12/24 0701 01/13/24 0702  WBC 14.8* 13.2* 12.6* 13.9* 16.1* 12.4*  NEUTROABS 12.5* 10.5*  --   --   --   --   HGB 10.3* 10.7* 10.7* 10.8* 11.0* 10.7*  HCT 32.7* 34.2* 34.1* 34.0* 34.4* 34.0*  MCV 82.6 83.2 84.2 83.1 82.3 83.3  PLT 221 233 285 329 352 369   Basic Metabolic Panel: Recent Labs  Lab 01/08/24  1610 01/09/24 9604 01/10/24 1024 01/11/24 0607 01/12/24 0701 01/13/24 0702  NA 137 135 134* 133* 135 138  K 4.1 4.6 4.9 4.8 5.1 5.0  CL 108 106 103 102 104 108  CO2 20* 21* 24 22 21* 22  GLUCOSE 230* 280* 279* 268* 202* 274*  BUN 39* 42* 48* 49* 55* 54*  CREATININE 2.80* 2.56* 2.31* 2.05* 2.17* 2.25*  CALCIUM 8.0* 8.3* 8.4* 8.3* 8.4* 8.2*  MG 1.7  --   --   --   --   --      Recent Results (from the past 240 hours)  Resp panel by RT-PCR (RSV, Flu A&B, Covid) Anterior Nasal Swab     Status: None   Collection Time: 01/07/24 12:51 AM   Specimen: Anterior Nasal Swab  Result Value Ref Range Status   SARS Coronavirus 2 by RT PCR NEGATIVE NEGATIVE Final   Influenza A by PCR NEGATIVE NEGATIVE Final   Influenza B by PCR NEGATIVE NEGATIVE Final    Comment: (NOTE) The Xpert Xpress SARS-CoV-2/FLU/RSV plus assay is intended as an aid in the diagnosis of influenza from Nasopharyngeal swab specimens and should not be used as a sole basis for treatment. Nasal washings and aspirates are unacceptable for Xpert Xpress SARS-CoV-2/FLU/RSV testing.  Fact Sheet for Patients: BloggerCourse.com  Fact Sheet for Healthcare  Providers: SeriousBroker.it  This test is not yet approved or cleared by the [place] FDA and has been authorized for detection and/or diagnosis of SARS-CoV-2 by FDA under an Emergency Use Authorization (EUA). This EUA will remain in effect (meaning this test can be used) for the duration of the COVID-19 declaration under Section 564(b)(1) of the Act, 21 U.S.C. section 360bbb-3(b)(1), unless the authorization is terminated or revoked.     Resp Syncytial Virus by PCR NEGATIVE NEGATIVE Final    Comment: (NOTE) Fact Sheet for Patients: BloggerCourse.com  Fact Sheet for Healthcare Providers: SeriousBroker.it  This test is not yet approved or cleared by the [place] FDA and has been authorized for detection and/or diagnosis of SARS-CoV-2 by FDA under an Emergency Use Authorization (EUA). This EUA will remain in effect (meaning this test can be used) for the duration of the COVID-19 declaration under Section 564(b)(1) of the Act, 21 U.S.C. section 360bbb-3(b)(1), unless the authorization is terminated or revoked.  Performed at [facility], [address], [place], [name] [address]   Culture, blood (Routine X 2) w Reflex to ID Panel     Status: None   Collection Time: 01/08/24  8:20 AM   Specimen: BLOOD LEFT ARM  Result Value Ref Range Status   Specimen Description BLOOD LEFT ARM  Final   Special Requests   Final    BOTTLES DRAWN AEROBIC AND ANAEROBIC Blood Culture results may not be optimal due to an inadequate volume of blood received in culture bottles   Culture   Final    NO GROWTH 5 DAYS Performed at [facility], [address], [place], [name] [address]    Report Status 01/13/2024 FINAL  Final  Culture, blood (Routine X 2) w Reflex to ID Panel     Status: None   Collection Time: 01/08/24  8:23 AM   Specimen: BLOOD RIGHT HAND  Result Value Ref Range Status   Specimen  Description BLOOD RIGHT HAND  Final   Special Requests   Final    BOTTLES DRAWN AEROBIC AND ANAEROBIC Blood Culture results may not be optimal due to an inadequate volume of  blood received in culture bottles   Culture   Final    NO GROWTH 5 DAYS Performed at [facility], [address], [place], [name] [address]    Report Status 01/13/2024 FINAL  Final     Radiology Studies: No results found.   Scheduled Meds:  aspirin EC  81 mg Oral Daily   atorvastatin  80 mg Oral Daily   buPROPion  150 mg Oral Daily   carvedilol  3.125 mg Oral BID WC   Chlorhexidine Gluconate Cloth  6 each Topical Daily   clopidogrel  75 mg Oral Daily   escitalopram  10 mg Oral Daily   finasteride  5 mg Oral Daily   fluconazole  100 mg Oral Q Sat   fluticasone  2 spray Each Nare Daily   heparin injection (subcutaneous)  5,000 Units Subcutaneous Q8H   icosapent Ethyl  2 g Oral BID   influenza vac split trivalent PF  0.5 mL Intramuscular Tomorrow-1000   insulin aspart  0-15 Units Subcutaneous TID WC   insulin aspart  0-5 Units Subcutaneous QHS   insulin aspart  8 Units Subcutaneous TID WC   insulin glargine-yfgn  38 Units Subcutaneous BID   ketotifen  1 drop Left Eye BID   lidocaine  1 patch Transdermal Q24H   loratadine  10 mg Oral Daily   pantoprazole  40 mg Oral Daily   polyethylene glycol  17 g Oral Daily   pregabalin  75 mg Oral BID   ranolazine  500 mg Oral BID   senna  1 tablet Oral BID   tamsulosin  0.4 mg Oral Daily   Continuous Infusions:  cefTRIAXone (ROCEPHIN)  IV 2 g (01/13/24 0935)   [START ON 01/14/2024] vancomycin       LOS: 4 days   Burnadette Pop, MD [facility] P1/24/2025, 11:23 AM

## 2024-01-13 NOTE — Plan of Care (Signed)

## 2024-01-13 NOTE — Care Management (Signed)
Called [employer] to follow up on wheelchair, it was delivered on 01/11/2023

## 2024-01-14 ENCOUNTER — Inpatient Hospital Stay (HOSPITAL_COMMUNITY): Payer: 59

## 2024-01-14 ENCOUNTER — Other Ambulatory Visit (HOSPITAL_COMMUNITY): Payer: Self-pay

## 2024-01-14 DIAGNOSIS — N179 Acute kidney failure, unspecified: Secondary | ICD-10-CM | POA: Diagnosis not present

## 2024-01-14 DIAGNOSIS — N1831 Chronic kidney disease, stage 3a: Secondary | ICD-10-CM | POA: Diagnosis not present

## 2024-01-14 LAB — CBC
HCT: 36.7 % — ABNORMAL LOW (ref 39.0–52.0)
Hemoglobin: 11.2 g/dL — ABNORMAL LOW (ref 13.0–17.0)
MCH: 25.7 pg — ABNORMAL LOW (ref 26.0–34.0)
MCHC: 30.5 g/dL (ref 30.0–36.0)
MCV: 84.4 fL (ref 80.0–100.0)
Platelets: 407 10*3/uL — ABNORMAL HIGH (ref 150–400)
RBC: 4.35 MIL/uL (ref 4.22–5.81)
RDW: 14.7 % (ref 11.5–15.5)
WBC: 14.5 10*3/uL — ABNORMAL HIGH (ref 4.0–10.5)
nRBC: 0 % (ref 0.0–0.2)

## 2024-01-14 LAB — BASIC METABOLIC PANEL
Anion gap: 11 (ref 5–15)
BUN: 54 mg/dL — ABNORMAL HIGH (ref 6–20)
CO2: 21 mmol/L — ABNORMAL LOW (ref 22–32)
Calcium: 8.5 mg/dL — ABNORMAL LOW (ref 8.9–10.3)
Chloride: 107 mmol/L (ref 98–111)
Creatinine, Ser: 1.98 mg/dL — ABNORMAL HIGH (ref 0.61–1.24)
GFR, Estimated: 40 mL/min — ABNORMAL LOW (ref >60)
Glucose, Bld: 251 mg/dL — ABNORMAL HIGH (ref 70–99)
Potassium: 5.3 mmol/L — ABNORMAL HIGH (ref 3.5–5.1)
Sodium: 139 mmol/L (ref 135–145)

## 2024-01-14 LAB — GLUCOSE, CAPILLARY
Glucose-Capillary: 220 mg/dL — ABNORMAL HIGH (ref 70–99)
Glucose-Capillary: 229 mg/dL — ABNORMAL HIGH (ref 70–99)
Glucose-Capillary: 134 mg/dL — ABNORMAL HIGH (ref 70–99)
Glucose-Capillary: 287 mg/dL — ABNORMAL HIGH (ref 70–99)

## 2024-01-14 MED ORDER — CEPHALEXIN 500 MG PO CAPS
500.0000 mg | ORAL_CAPSULE | Freq: Three times a day (TID) | ORAL | Status: DC
  Administered 2024-01-14 – 2024-01-16 (×5): 500 mg via ORAL
  Filled 2024-01-14 (×5): qty 1

## 2024-01-14 MED ORDER — TIZANIDINE HCL 4 MG PO TABS
4.0000 mg | ORAL_TABLET | Freq: Three times a day (TID) | ORAL | Status: DC | PRN
  Administered 2024-01-14 (×2): 4 mg via ORAL
  Filled 2024-01-14 (×2): qty 1

## 2024-01-14 MED ORDER — LINEZOLID 600 MG PO TABS
600.0000 mg | ORAL_TABLET | Freq: Two times a day (BID) | ORAL | 0 refills | Status: AC
Start: 2024-01-15 — End: 2024-01-22
  Filled 2024-01-14: qty 14, 7d supply, fill #0

## 2024-01-14 MED ORDER — SENNA 8.6 MG PO TABS
2.0000 | ORAL_TABLET | Freq: Two times a day (BID) | ORAL | Status: DC
  Filled 2024-01-14 (×4): qty 2

## 2024-01-14 MED ORDER — TAMSULOSIN HCL 0.4 MG PO CAPS
0.4000 mg | ORAL_CAPSULE | Freq: Every day | ORAL | 0 refills | Status: DC
  Filled 2024-01-14: qty 30, 30d supply, fill #0

## 2024-01-14 MED ORDER — BISACODYL 10 MG RE SUPP: 10.0000 mg | Freq: Once | RECTAL | Status: DC

## 2024-01-14 MED ORDER — LANTUS SOLOSTAR 100 UNIT/ML ~~LOC~~ SOPN: 38.0000 [IU] | PEN_INJECTOR | Freq: Two times a day (BID) | SUBCUTANEOUS | 1 refills | Status: AC

## 2024-01-14 MED ORDER — PREDNISONE 20 MG PO TABS
40.0000 mg | ORAL_TABLET | Freq: Every day | ORAL | Status: DC
Start: 2024-01-14 — End: 2024-01-15
  Administered 2024-01-14 – 2024-01-15 (×2): 40 mg via ORAL
  Filled 2024-01-14 (×2): qty 2

## 2024-01-14 MED ORDER — HYDROXYZINE HCL 25 MG PO TABS
25.0000 mg | ORAL_TABLET | Freq: Three times a day (TID) | ORAL | Status: DC | PRN
  Administered 2024-01-14 – 2024-01-16 (×2): 25 mg via ORAL
  Filled 2024-01-14 (×2): qty 1

## 2024-01-14 MED ORDER — OXYCODONE HCL 5 MG PO TABS
15.0000 mg | ORAL_TABLET | Freq: Four times a day (QID) | ORAL | Status: DC | PRN
  Administered 2024-01-14 – 2024-01-16 (×7): 15 mg via ORAL
  Filled 2024-01-14 (×7): qty 3

## 2024-01-14 MED ORDER — LINEZOLID 600 MG PO TABS
600.0000 mg | ORAL_TABLET | Freq: Two times a day (BID) | ORAL | Status: DC
  Administered 2024-01-14 – 2024-01-16 (×4): 600 mg via ORAL
  Filled 2024-01-14 (×4): qty 1

## 2024-01-14 MED ORDER — ALPRAZOLAM 0.5 MG PO TABS
1.0000 mg | ORAL_TABLET | Freq: Two times a day (BID) | ORAL | Status: DC | PRN
  Administered 2024-01-16: 1 mg via ORAL
  Filled 2024-01-14: qty 2

## 2024-01-14 MED ORDER — POLYETHYLENE GLYCOL 3350 17 G PO PACK
17.0000 g | PACK | Freq: Two times a day (BID) | ORAL | Status: DC
  Filled 2024-01-14 (×4): qty 1

## 2024-01-14 MED ORDER — INSULIN ASPART 100 UNIT/ML IJ SOLN: 8.0000 [IU] | Freq: Three times a day (TID) | INTRAMUSCULAR | 0 refills | Status: DC

## 2024-01-14 MED ORDER — DIPHENHYDRAMINE-ZINC ACETATE 2-0.1 % EX CREA
TOPICAL_CREAM | Freq: Three times a day (TID) | CUTANEOUS | Status: DC | PRN
  Filled 2024-01-14: qty 28

## 2024-01-14 MED ORDER — CEPHALEXIN 500 MG PO CAPS
500.0000 mg | ORAL_CAPSULE | Freq: Three times a day (TID) | ORAL | 0 refills | Status: AC
  Filled 2024-01-14: qty 21, 7d supply, fill #0

## 2024-01-14 MED ORDER — SODIUM ZIRCONIUM CYCLOSILICATE 10 G PO PACK
10.0000 g | PACK | Freq: Once | ORAL | Status: AC
  Administered 2024-01-14: 10 g via ORAL
  Filled 2024-01-14: qty 1

## 2024-01-14 NOTE — Plan of Care (Signed)

## 2024-01-14 NOTE — Progress Notes (Signed)
There was plan for discharging the patient.  I was called because his right upper extremity was found to be swollen and he has developed macular rash.  Patient seen and examined at the bedside.  The rash is mainly on the right upper extremity, nonpruritic, macular.  He also has significant edema of the right upper extremity.  Patient has IV line in the right upper extremity.  This is most likely from IV infiltration. Will take the IV line out.  The rash could be from vancomycin allergy.  Vancomycin already stopped.  Started on  steroid.  Continue cold compression for right upper extremity edema.  Venous Doppler ordered.DC canceled.  Discharge plan likely tomorrow

## 2024-01-14 NOTE — TOC Transition Note (Signed)
Transition of Care [facility]) - Discharge Note   Patient Details  Name: LLEWELLYN CHOPLIN MRN: [id number] Date of Birth: 12-09-1971  [facility] CM/SW Contact:  Lawerance Sabal, RN Phone Number: 01/14/2024, 4:46 PM   Clinical Narrative:     Verified with patient that [facility] has been delivered, he has ride home w his Uncle. No other TOC needs identified for DC     Barriers to Discharge: Continued Medical Work up   Patient Goals and CMS Choice   CMS Medicare.gov Compare Post Acute Care list provided to:: Patient Choice offered to / list presented to : Patient      Discharge Placement                       Discharge Plan and Services Additional resources added to the After Visit Summary for     Discharge Planning Services: CM Consult Post Acute Care Choice: Durable Medical Equipment          DME Arranged: Wheelchair manual DME Agency: [employer] Date DME Agency Contacted: 01/11/24   Representative spoke with at DME Agency: Zack            Social Drivers of Health (SDOH) Interventions SDOH Screenings   Food Insecurity: No Food Insecurity (01/07/2024)  Housing: Low Risk  (01/07/2024)  Transportation Needs: No Transportation Needs (01/07/2024)  Recent Concern: Transportation Needs - Unmet Transportation Needs (11/26/2023)  Utilities: Not At Risk (01/07/2024)  Depression (PHQ2-9): Low Risk  (05/06/2021)  Financial Resource Strain: Medium Risk (09/09/2022)  Social Connections: Moderately Isolated (01/07/2024)  Stress: No Stress Concern Present (10/10/2023)   Received from [facility]  Tobacco Use: Medium Risk (01/07/2024)     Readmission Risk Interventions    05/03/2023   11:58 AM 11/24/2021    4:05 PM 08/20/2021   12:26 PM  Readmission Risk Prevention Plan  Transportation Screening Complete Complete Complete  PCP or Specialist Appt within 3-5 Days Complete    HRI or Home Care Consult Complete    Social Work Consult for Recovery Care Planning/Counseling  Complete    Palliative Care Screening Not Applicable    Medication Review Oceanographer) Referral to Pharmacy Complete Complete  PCP or Specialist appointment within 3-5 days of discharge  Complete Complete  HRI or Home Care Consult  Complete Complete  SW Recovery Care/Counseling Consult  Complete Complete  Palliative Care Screening  Not Applicable Not Applicable  Skilled Nursing Facility  Complete Not Applicable

## 2024-01-14 NOTE — Discharge Summary (Addendum)
Physician Discharge Summary  Joseph [name] BJY:[id number] DOB: 1971-10-10 DOA: 01/07/2024  PCP: Cain Saupe, MD  Admit date: 01/07/2024 Discharge date: 01/16/2024  Admitted From: Home Disposition:  Home  Discharge Condition:Stable CODE STATUS:FULL Diet recommendation:Carb Modified   Brief/Interim Summary: Patient is a 53 year old male with history of morbid obesity, diabetes type 2, hypertension, diastolic CHF, COPD, bilateral lower extremity BKA, CKD stage III with baseline creatinine of 1.7 who presented from home with complaint of fatigue, chest pain, shortness of breath, subjective fever, swelling of left BKA stump.  On presentation, he had leukocytosis with WBC count of 18.6, creatinine of 3.3.  Chest x-ray did not show any acute findings.  Started on ceftriaxone for cellulitis of left BKA stump.  Hospital course remarkable for urinary retention, Foley placed.  Orthopedics consulted for cellulitis of left BKA stump, antibiotics broadened .  Cellulitis of left BKA stump has improved.  Orthopedics recommended antibiotic, no intervention.  We recommend to follow-up with Dr. [name] as an outpatient.  Medically stable for discharge home today.   Following problems were addressed during the hospitalization:  AKI on CKD stage IIIa/hyperkalemia: Baseline creatinine 1.7.  Presented with creatinine of more than 3.  Could be from bladder outlet obstruction.  Status post Foley placement.  Creatinine now close to baseline.He follows with  with [facility].  Check BMP in a week.  Follow-up with nephrology as an outpatient.     Urine retention: Foley placed here,now removed  Continue flomax. He needs to follow-up with urology as an outpatient.  He has an appointment on Jan 31st.     Concern for left stump cellulitis: Presented with leukocytosis but no fever.  Blood cultures have been negative so far.   Dr. [name] recommending conservative management with antibiotics,no  plan for surgery.   Leukocytosis  is better .  Afebrile.  Continue Zyvox and Keflex for a week.  Follow-up with Dr. [name] in a week  Macular rash: Started on 1/25.  Not pruritic.  Likely from vancomycin.  Vancomycin has been stopped already.   Also developed right upper extremity swelling likely from IV infiltration.  Venous Doppler negative for DVT.  Rash is improving   COPD: Currently not on exacerbation.   Hypertension: Continue current medications.  Blood pressure stable   HFpEF: Echo done here showed EF of 55 to 60%.  Diuretics on hold. Takes aspirin, Plavix, Ranexa, statin at home.  Currently appears euvolemic   Chronic back pain: Continue pain management, supportive care   Anxiety/depression: On Wellbutrin, Xanax   Insulin-dependent diabetes type 2: Diabetic coordinator  was following.  Takes metformin, Farxiga ,insulin at home.  Both oral meds on  hold due to CKD.  Continue current insulin regimen. Monitor blood sugars at home.  Diabetic coordinator was following.  Blood sugars high mostly from noncompliance with diet   History of PE: Currently not on anticoagulation   History of bilateral BKA/debility: Patient lives with his family at home.  Ambulates with the help of wheelchair.    Morbid obesity: BMI of more than 70     Discharge Diagnoses:  Principal Problem:   Acute renal failure superimposed on stage 3a chronic kidney disease (HCC) Active Problems:   Acute urinary retention   COPD (chronic obstructive pulmonary disease) (HCC)   Leukocytosis   History of pulmonary embolus (PE)   Insulin dependent type 2 diabetes mellitus (HCC)   Anxiety and depression   Chronic back pain   BMI 70 and over, adult (HCC)  Heart failure with preserved ejection fraction (HCC)   Essential hypertension   Cellulitis of left lower leg   S/P bilateral BKA (below knee amputation) (HCC)   Acute kidney failure [facility])    Discharge Instructions  Discharge Instructions     Diet Carb Modified   Complete by: As directed     Discharge instructions   Complete by: As directed    1)Please take prescribed medications as instructed 2)Follow up with your PCP in a week.  Do a BMP test to check your kidney function and potassium level during the follow-up. 3) Follow-up with your nephrologist as an outpatient in 2 weeks.Follow up with your urologist 4)Follow up with Dr. [name] in a week 5)Monitor your blood sugars at home   Increase activity slowly   Complete by: As directed    No wound care   Complete by: As directed       Allergies as of 01/16/2024       Reactions   Sulfa Antibiotics Other (See Comments)   Headaches        Medication List     STOP taking these medications    dapagliflozin propanediol 10 MG Tabs tablet Commonly known as: FARXIGA   metFORMIN 1000 MG tablet Commonly known as: GLUCOPHAGE       TAKE these medications    acetaminophen 500 MG tablet Commonly known as: TYLENOL Take 1,000 mg by mouth 3 (three) times daily as needed for headache.   albuterol 108 (90 Base) MCG/ACT inhaler Commonly known as: VENTOLIN HFA Inhale 2 puffs into the lungs every 6 (six) hours as needed for shortness of breath.   ALPRAZolam 1 MG tablet Commonly known as: XANAX Take 1 tablet (1 mg total) by mouth 2 (two) times daily as needed. What changed: reasons to take this   aspirin EC 81 MG tablet Take 81 mg by mouth in the morning. Swallow whole.   atorvastatin 80 MG tablet Commonly known as: LIPITOR TAKE ONE TABLET (80 MG TOTAL) BY MOUTH DAILY AT 6PM APPOINTMENT OVERDUE: NEEDS APPOINTMENT FOR FURTHER REFILLS PLEASE CALL OFFICE What changed: See the new instructions.   BD Pen Needle Nano U/F 32G X 4 MM Misc Generic drug: Insulin Pen Needle Use as directed with insulin pens   buPROPion 150 MG 24 hr tablet Commonly known as: WELLBUTRIN XL Take 150 mg by mouth in the morning and at bedtime.   carvedilol 6.25 MG tablet Commonly known as: COREG TAKE ONE TABLET (6.25 MG TOTAL) BY MOUTH TWICE  DAILY @9AM -5PM What changed: See the new instructions.   cephALEXin 500 MG capsule Commonly known as: KEFLEX Take 1 capsule (500 mg total) by mouth 3 (three) times daily for 7 days.   clopidogrel 75 MG tablet Commonly known as: PLAVIX TAKE ONE TABLET (75 MG TOTAL) BY MOUTH DAILY AT 9AM WITH BREAKFAST APPOINTMENT OVERDUE: NEEDS APPOINTMENT FOR FURTHER REFILLS, PLEASE CALL OFFICE What changed: See the new instructions.   escitalopram 10 MG tablet Commonly known as: LEXAPRO Take 10 mg by mouth daily.   fluconazole 100 MG tablet Commonly known as: DIFLUCAN Take 100 mg by mouth every Saturday.   fluticasone 50 MCG/ACT nasal spray Commonly known as: FLONASE Place 2 sprays into both nostrils daily.   FreeStyle Libre 14 Day Sensor Misc Apply topically 4 (four) times daily.   icosapent Ethyl 1 g capsule Commonly known as: Vascepa Take 2 capsules (2 g total) by mouth 2 (two) times daily.   insulin aspart 100 UNIT/ML injection Commonly known  as: novoLOG Inject 8 Units into the skin 3 (three) times daily with meals. Hold if eats less than 50% of meals What changed:  how much to take additional instructions   ketotifen 0.035 % ophthalmic solution Commonly known as: ZADITOR Place 1 drop into the left eye 2 (two) times daily.   Lantus SoloStar 100 UNIT/ML Solostar Pen Generic drug: insulin glargine Inject 38 Units into the skin 2 (two) times daily. What changed: how much to take   lidocaine 5 % Commonly known as: LIDODERM Place 1 patch onto the skin daily.   linezolid 600 MG tablet Commonly known as: ZYVOX Take 1 tablet (600 mg total) by mouth 2 (two) times daily for 7 days.   loratadine 10 MG tablet Commonly known as: CLARITIN Take 10 mg by mouth daily.   naloxone 4 MG/0.1ML Liqd nasal spray kit Commonly known as: NARCAN Place 4 mg into the nose as needed (overdose).   nitroGLYCERIN 0.4 MG SL tablet Commonly known as: NITROSTAT Place 1 tablet (0.4 mg total) under  the tongue every 5 (five) minutes as needed. What changed: reasons to take this   oxyCODONE 15 MG immediate release tablet Commonly known as: ROXICODONE Take 1 tablet (15 mg total) by mouth every 6 (six) hours as needed.   pantoprazole 40 MG tablet Commonly known as: PROTONIX Take 1 tablet (40 mg total) by mouth daily.   pregabalin 150 MG capsule Commonly known as: LYRICA Take 150 mg by mouth 3 (three) times daily.   promethazine 25 MG tablet Commonly known as: PHENERGAN Take 25 mg by mouth every 4 (four) hours as needed for nausea or vomiting.   ranolazine 500 MG 12 hr tablet Commonly known as: RANEXA Take 1 tablet (500 mg total) by mouth 2 (two) times daily.   tamsulosin 0.4 MG Caps capsule Commonly known as: FLOMAX Take 1 capsule (0.4 mg total) by mouth daily.   tiZANidine 4 MG tablet Commonly known as: ZANAFLEX Take 4 mg by mouth 3 (three) times daily.               Durable Medical Equipment  (From admission, onward)           Start     Ordered   01/11/24 1611  For home use only DME standard manual wheelchair with seat cushion  Once       Comments: Patient suffers from bilateral amputee which impairs their ability to perform daily activities like bathing, grooming, and toileting in the home.  A walker will not resolve issue with performing activities of daily living. A wheelchair will allow patient to safely perform daily activities. Patient can safely propel the wheelchair in the home or has a caregiver who can provide assistance. Length of need Lifetime. Accessories: elevating leg rests (ELRs), wheel locks, extensions and anti-tippers.  Needs heavy duty due to size/ weight   01/11/24 1612            Follow-up Information     Fulp, Cammie, MD. Schedule an appointment as soon as possible for a visit in 1 week(s).   Specialty: Family Medicine Contact information: [address] [name] [address] [phone]         [name], MD. Schedule an appointment as soon as possible for a visit in 1 week(s).   Specialty: Orthopedic Surgery Contact information: [address] [name] [address] [phone]                Allergies  Allergen Reactions   Sulfa Antibiotics Other (See Comments)    Headaches     Consultations: Orthopedics   Procedures/Studies: VAS [place] UPPER EXTREMITY VENOUS DUPLEX Result Date: 01/15/2024 UPPER VENOUS STUDY  Patient Name:  Joseph [name] [facility]  Date of Exam:   01/15/2024 Medical Rec #: [id number]        Accession #:    [id number] Date of Birth: Oct 20, 1971        Patient Gender: M Patient Age:   44 years Exam Location:  [facility] Procedure:      VAS [place] UPPER EXTREMITY VENOUS DUPLEX Referring Phys: Hartley Wyke --------------------------------------------------------------------------------  Indications: Right upper extremity edema and rash noted by MD to be most likely due to IV infiltration of Vancomycin Comparison Study: No prior study on file Performing Technologist: Sherren Kerns RVS  Examination Guidelines: A complete evaluation includes B-mode imaging, spectral Doppler, color Doppler, and power Doppler as needed of all accessible portions of each vessel. Bilateral testing is considered an integral part of a complete examination. Limited examinations for reoccurring indications may be performed as noted.  Right Findings: +----------+------------+---------+-----------+----------+-------+ RIGHT     CompressiblePhasicitySpontaneousPropertiesSummary +----------+------------+---------+-----------+----------+-------+ IJV           Full       Yes       Yes                      +----------+------------+---------+-----------+----------+-------+ Subclavian               Yes       Yes                      +----------+------------+---------+-----------+----------+-------+ Axillary                 Yes       Yes                       +----------+------------+---------+-----------+----------+-------+ Brachial      Full       Yes       Yes                      +----------+------------+---------+-----------+----------+-------+ Radial        Full                                          +----------+------------+---------+-----------+----------+-------+ Ulnar         Full                                          +----------+------------+---------+-----------+----------+-------+ Cephalic      Full                                          +----------+------------+---------+-----------+----------+-------+ Basilic       Full                                          +----------+------------+---------+-----------+----------+-------+  Left Findings: +----------+------------+---------+-----------+----------+-------+ LEFT      CompressiblePhasicitySpontaneousPropertiesSummary +----------+------------+---------+-----------+----------+-------+ Subclavian  Yes       Yes                      +----------+------------+---------+-----------+----------+-------+  Summary:  Right: No evidence of deep vein thrombosis in the upper extremity. No evidence of superficial vein thrombosis in the upper extremity. Interstitial edema noted throughout right upper extremity.  Left: No evidence of thrombosis in the subclavian.  *See table(s) above for measurements and observations.  Diagnosing physician: Carolynn Sayers Electronically signed by Carolynn Sayers on 01/15/2024 at 12:26:27 PM.    Final    VAS [place] LOWER EXTREMITY VENOUS (DVT) Result Date: 01/09/2024  Lower Venous DVT Study Patient Name:  HALIL RENTZ [facility]  Date of Exam:   01/09/2024 Medical Rec #: [id number]        Accession #:    [id number] Date of Birth: 1971-01-24        Patient Gender: M Patient Age:   21 years Exam Location:  [facility] Procedure:      VAS [place] LOWER EXTREMITY VENOUS (DVT) Referring Phys: Arnetha Courser  --------------------------------------------------------------------------------  Indications: Swelling, and Edema.  Risk Factors: Immobility obesity. Limitations: Body habitus and BKA. Comparison Study: No significant changes seen since previous exam 11/18/21 Performing Technologist: Shona Simpson  Examination Guidelines: A complete evaluation includes B-mode imaging, spectral Doppler, color Doppler, and power Doppler as needed of all accessible portions of each vessel. Bilateral testing is considered an integral part of a complete examination. Limited examinations for reoccurring indications may be performed as noted. The reflux portion of the exam is performed with the patient in reverse Trendelenburg.  +-----+---------------+---------+-----------+----------+--------------+ RIGHTCompressibilityPhasicitySpontaneityPropertiesThrombus Aging +-----+---------------+---------+-----------+----------+--------------+ CFV  Full           Yes      Yes                                 +-----+---------------+---------+-----------+----------+--------------+   +---------+---------------+---------+-----------+----------+-------------------+ LEFT     CompressibilityPhasicitySpontaneityPropertiesThrombus Aging      +---------+---------------+---------+-----------+----------+-------------------+ CFV      Full           Yes      Yes                                      +---------+---------------+---------+-----------+----------+-------------------+ SFJ      Full                                                             +---------+---------------+---------+-----------+----------+-------------------+ FV Prox  Full                                                             +---------+---------------+---------+-----------+----------+-------------------+ FV Mid   Full                                                              +---------+---------------+---------+-----------+----------+-------------------+  FV Distal                        Yes                  Not well visualized +---------+---------------+---------+-----------+----------+-------------------+ PFV      Full                                                             +---------+---------------+---------+-----------+----------+-------------------+ POP      Full           Yes      Yes                                      +---------+---------------+---------+-----------+----------+-------------------+     Summary: RIGHT: - No evidence of common femoral vein obstruction.   LEFT: - There is no evidence of deep vein thrombosis in the lower extremity.  - No cystic structure found in the popliteal fossa. - Ultrasound characteristics of enlarged lymph nodes noted in the groin.  *See table(s) above for measurements and observations. Electronically signed by Carolynn Sayers on 01/09/2024 at 4:54:13 PM.    Final    US RENAL Result Date: 01/07/2024 CLINICAL DATA:  Acute renal failure EXAM: RENAL / URINARY TRACT ULTRASOUND COMPLETE COMPARISON:  None Available. FINDINGS: Right Kidney: Renal measurements: 11.7 x 7.7 x 6.6 cm = volume: 311 mL. Echogenicity within normal limits. No mass or hydronephrosis visualized. Left Kidney: Renal measurements: 13.5 x 7.0 x 6.3 cm = volume: 313 mL. Echogenicity within normal limits. No mass or hydronephrosis visualized. Bladder: Appears normal for degree of bladder distention. Other: None. IMPRESSION: 1. Normal renal sonogram. Electronically Signed   By: Helyn Numbers M.D.   On: 01/07/2024 03:55   DG Chest 2 View Result Date: 01/07/2024 CLINICAL DATA:  Chest pain and shortness of breath EXAM: CHEST - 2 VIEW COMPARISON:  05/01/2023 FINDINGS: Stable cardiomediastinal silhouette. Sternotomy and CABG. Low lung volumes. Left basilar atelectasis. No pleural effusion or pneumothorax. No displaced rib fractures. IMPRESSION: No acute  cardiopulmonary process. Electronically Signed   By: Minerva Fester M.D.   On: 01/07/2024 01:36      Subjective: Patient seen and examined the bedside today.  Comfortable.  Kidney function has improved today.  He is voiding by himself.  Had a bowel movement yesterday.  Rash has not progressed.  He is medically stable for discharge to home today.  Discharge Exam: Vitals:   01/16/24 0523 01/16/24 0826  BP: (!) 148/99 (!) 130/101  Pulse:  88  Resp: 16 18  Temp: (!) 97.4 F (36.3 C) 97.6 F (36.4 C)  SpO2: 100% 97%   Vitals:   01/15/24 2236 01/15/24 2240 01/16/24 0523 01/16/24 0826  BP:  (!) 159/77 (!) 148/99 (!) 130/101  Pulse:  (!) 45  88  Resp:  19 16 18   Temp: 98 F (36.7 C)  (!) 97.4 F (36.3 C) 97.6 F (36.4 C)  TempSrc: Oral  Oral Oral  SpO2:  98% 100% 97%  Weight:      Height:        General: Pt is alert, awake, not in acute distress, morbidly obese Cardiovascular: RRR, S1/S2 +, no  rubs, no gallops Respiratory: CTA bilaterally, no wheezing, no rhonchi Abdominal: Soft, NT, ND, bowel sounds + Extremities: Bilateral BKA, cellulitis of left BKA stump    The results of significant diagnostics from this hospitalization (including imaging, microbiology, ancillary and laboratory) are listed below for reference.     Microbiology: Recent Results (from the past 240 hours)  Resp panel by RT-PCR (RSV, Flu A&B, Covid) Anterior Nasal Swab     Status: None   Collection Time: 01/07/24 12:51 AM   Specimen: Anterior Nasal Swab  Result Value Ref Range Status   SARS Coronavirus 2 by RT PCR NEGATIVE NEGATIVE Final   Influenza A by PCR NEGATIVE NEGATIVE Final   Influenza B by PCR NEGATIVE NEGATIVE Final    Comment: (NOTE) The Xpert Xpress SARS-CoV-2/FLU/RSV plus assay is intended as an aid in the diagnosis of influenza from Nasopharyngeal swab specimens and should not be used as a sole basis for treatment. Nasal washings and aspirates are unacceptable for Xpert Xpress  SARS-CoV-2/FLU/RSV testing.  Fact Sheet for Patients: BloggerCourse.com  Fact Sheet for Healthcare Providers: SeriousBroker.it  This test is not yet approved or cleared by the [place] FDA and has been authorized for detection and/or diagnosis of SARS-CoV-2 by FDA under an Emergency Use Authorization (EUA). This EUA will remain in effect (meaning this test can be used) for the duration of the COVID-19 declaration under Section 564(b)(1) of the Act, 21 U.S.C. section 360bbb-3(b)(1), unless the authorization is terminated or revoked.     Resp Syncytial Virus by PCR NEGATIVE NEGATIVE Final    Comment: (NOTE) Fact Sheet for Patients: BloggerCourse.com  Fact Sheet for Healthcare Providers: SeriousBroker.it  This test is not yet approved or cleared by the [place] FDA and has been authorized for detection and/or diagnosis of SARS-CoV-2 by FDA under an Emergency Use Authorization (EUA). This EUA will remain in effect (meaning this test can be used) for the duration of the COVID-19 declaration under Section 564(b)(1) of the Act, 21 U.S.C. section 360bbb-3(b)(1), unless the authorization is terminated or revoked.  Performed at [facility], [address], [place], [name] [address]   Culture, blood (Routine X 2) w Reflex to ID Panel     Status: None   Collection Time: 01/08/24  8:20 AM   Specimen: BLOOD LEFT ARM  Result Value Ref Range Status   Specimen Description BLOOD LEFT ARM  Final   Special Requests   Final    BOTTLES DRAWN AEROBIC AND ANAEROBIC Blood Culture results may not be optimal due to an inadequate volume of blood received in culture bottles   Culture   Final    NO GROWTH 5 DAYS Performed at [facility], [address], [place], [name] [address]    Report Status 01/13/2024 FINAL  Final  Culture, blood (Routine X 2) w Reflex to ID Panel      Status: None   Collection Time: 01/08/24  8:23 AM   Specimen: BLOOD RIGHT HAND  Result Value Ref Range Status   Specimen Description BLOOD RIGHT HAND  Final   Special Requests   Final    BOTTLES DRAWN AEROBIC AND ANAEROBIC Blood Culture results may not be optimal due to an inadequate volume of blood received in culture bottles   Culture   Final    NO GROWTH 5 DAYS Performed at [facility], [address], [place], [name] [address]    Report Status 01/13/2024 FINAL  Final     Labs:  BNP (last 3 results) Recent Labs    04/26/23 1214 01/07/24 0045  BNP 137.3* 347.2*   Basic Metabolic Panel: Recent Labs  Lab 01/12/24 0701 01/13/24 0702 01/14/24 0745 01/15/24 0729 01/16/24 0713  NA 135 138 139 136 138  K 5.1 5.0 5.3* 5.5* 4.7  CL 104 108 107 107 108  CO2 21* 22 21* 21* 22  GLUCOSE 202* 274* 251* 250* 278*  BUN 55* 54* 54* 54* 51*  CREATININE 2.17* 2.25* 1.98* 1.90* 1.78*  CALCIUM 8.4* 8.2* 8.5* 8.3* 8.4*   Liver Function Tests: No results for input(s): "AST", "ALT", "ALKPHOS", "BILITOT", "PROT", "ALBUMIN" in the last 168 hours.  No results for input(s): "LIPASE", "AMYLASE" in the last 168 hours. No results for input(s): "AMMONIA" in the last 168 hours. CBC: Recent Labs  Lab 01/12/24 0701 01/13/24 0702 01/14/24 0745 01/15/24 0729 01/16/24 0713  WBC 16.1* 12.4* 14.5* 17.2* 15.9*  HGB 11.0* 10.7* 11.2* 10.7* 10.2*  HCT 34.4* 34.0* 36.7* 34.0* 32.6*  MCV 82.3 83.3 84.4 82.5 83.8  PLT 352 369 407* 404* 406*   Cardiac Enzymes: No results for input(s): "CKTOTAL", "CKMB", "CKMBINDEX", "TROPONINI" in the last 168 hours. BNP: Invalid input(s): "POCBNP" CBG: Recent Labs  Lab 01/15/24 0816 01/15/24 1220 01/15/24 1602 01/15/24 2053 01/16/24 0813  GLUCAP 218* 301* 372* 325* 215*   D-Dimer No results for input(s): "DDIMER" in the last 72 hours. Hgb A1c No results for input(s): "HGBA1C" in the last 72 hours. Lipid Profile No results for input(s): "CHOL",  "HDL", "LDLCALC", "TRIG", "CHOLHDL", "LDLDIRECT" in the last 72 hours. Thyroid function studies No results for input(s): "TSH", "T4TOTAL", "T3FREE", "THYROIDAB" in the last 72 hours.  Invalid input(s): "FREET3" Anemia work up No results for input(s): "VITAMINB12", "FOLATE", "FERRITIN", "TIBC", "IRON", "RETICCTPCT" in the last 72 hours. Urinalysis    Component Value Date/Time   COLORURINE YELLOW 01/07/2024 1159   APPEARANCEUR CLOUDY (A) 01/07/2024 1159   LABSPEC 1.014 01/07/2024 1159   PHURINE 5.0 01/07/2024 1159   GLUCOSEU >=500 (A) 01/07/2024 1159   HGBUR SMALL (A) 01/07/2024 1159   BILIRUBINUR NEGATIVE 01/07/2024 1159   KETONESUR NEGATIVE 01/07/2024 1159   PROTEINUR >=300 (A) 01/07/2024 1159   UROBILINOGEN 0.2 10/27/2007 1550   NITRITE NEGATIVE 01/07/2024 1159   LEUKOCYTESUR NEGATIVE 01/07/2024 1159   Sepsis Labs Recent Labs  Lab 01/13/24 0702 01/14/24 0745 01/15/24 0729 01/16/24 0713  WBC 12.4* 14.5* 17.2* 15.9*   Microbiology Recent Results (from the past 240 hours)  Resp panel by RT-PCR (RSV, Flu A&B, Covid) Anterior Nasal Swab     Status: None   Collection Time: 01/07/24 12:51 AM   Specimen: Anterior Nasal Swab  Result Value Ref Range Status   SARS Coronavirus 2 by RT PCR NEGATIVE NEGATIVE Final   Influenza A by PCR NEGATIVE NEGATIVE Final   Influenza B by PCR NEGATIVE NEGATIVE Final    Comment: (NOTE) The Xpert Xpress SARS-CoV-2/FLU/RSV plus assay is intended as an aid in the diagnosis of influenza from Nasopharyngeal swab specimens and should not be used as a sole basis for treatment. Nasal washings and aspirates are unacceptable for Xpert Xpress SARS-CoV-2/FLU/RSV testing.  Fact Sheet for Patients: BloggerCourse.com  Fact Sheet for Healthcare Providers: SeriousBroker.it  This test is not yet approved or cleared by the [place] FDA and has been authorized for detection and/or diagnosis of SARS-CoV-2  by FDA under an Emergency Use Authorization (EUA). This EUA will remain in effect (meaning this test can be used) for the duration of the COVID-19  declaration under Section 564(b)(1) of the Act, 21 U.S.C. section 360bbb-3(b)(1), unless the authorization is terminated or revoked.     Resp Syncytial Virus by PCR NEGATIVE NEGATIVE Final    Comment: (NOTE) Fact Sheet for Patients: BloggerCourse.com  Fact Sheet for Healthcare Providers: SeriousBroker.it  This test is not yet approved or cleared by the [place] FDA and has been authorized for detection and/or diagnosis of SARS-CoV-2 by FDA under an Emergency Use Authorization (EUA). This EUA will remain in effect (meaning this test can be used) for the duration of the COVID-19 declaration under Section 564(b)(1) of the Act, 21 U.S.C. section 360bbb-3(b)(1), unless the authorization is terminated or revoked.  Performed at [facility], [address] [place], [name] [address]   Culture, blood (Routine X 2) w Reflex to ID Panel     Status: None   Collection Time: 01/08/24  8:20 AM   Specimen: BLOOD LEFT ARM  Result Value Ref Range Status   Specimen Description BLOOD LEFT ARM  Final   Special Requests   Final    BOTTLES DRAWN AEROBIC AND ANAEROBIC Blood Culture results may not be optimal due to an inadequate volume of blood received in culture bottles   Culture   Final    NO GROWTH 5 DAYS Performed at [facility], [address] [place], [name] [address]    Report Status 01/13/2024 FINAL  Final  Culture, blood (Routine X 2) w Reflex to ID Panel     Status: None   Collection Time: 01/08/24  8:23 AM   Specimen: BLOOD RIGHT HAND  Result Value Ref Range Status   Specimen Description BLOOD RIGHT HAND  Final   Special Requests   Final    BOTTLES DRAWN AEROBIC AND ANAEROBIC Blood Culture results may not be optimal due to an inadequate volume of blood received in  culture bottles   Culture   Final    NO GROWTH 5 DAYS Performed at [facility], [address] [place], [name] [address]    Report Status 01/13/2024 FINAL  Final    Please note: You were cared for by a hospitalist during your hospital stay. Once you are discharged, your primary care physician will handle any further medical issues. Please note that NO REFILLS for any discharge medications will be authorized once you are discharged, as it is imperative that you return to your primary care physician (or establish a relationship with a primary care physician if you do not have one) for your post hospital discharge needs so that they can reassess your need for medications and monitor your lab values.    Time coordinating discharge: 40 minutes  SIGNED:   Burnadette Pop, MD  [facility] 01/16/2024, 10:01 AM Pager [phone]  If 7PM-7AM, please contact night-coverage [email or web address] Password [email or web address]

## 2024-01-15 ENCOUNTER — Inpatient Hospital Stay (HOSPITAL_COMMUNITY): Payer: 59

## 2024-01-15 DIAGNOSIS — N179 Acute kidney failure, unspecified: Secondary | ICD-10-CM | POA: Diagnosis not present

## 2024-01-15 DIAGNOSIS — N1831 Chronic kidney disease, stage 3a: Secondary | ICD-10-CM | POA: Diagnosis not present

## 2024-01-15 DIAGNOSIS — R609 Edema, unspecified: Secondary | ICD-10-CM | POA: Diagnosis not present

## 2024-01-15 LAB — BASIC METABOLIC PANEL
Anion gap: 8 (ref 5–15)
BUN: 54 mg/dL — ABNORMAL HIGH (ref 6–20)
CO2: 21 mmol/L — ABNORMAL LOW (ref 22–32)
Calcium: 8.3 mg/dL — ABNORMAL LOW (ref 8.9–10.3)
Chloride: 107 mmol/L (ref 98–111)
Creatinine, Ser: 1.9 mg/dL — ABNORMAL HIGH (ref 0.61–1.24)
GFR, Estimated: 42 mL/min — ABNORMAL LOW (ref >60)
Glucose, Bld: 250 mg/dL — ABNORMAL HIGH (ref 70–99)
Potassium: 5.5 mmol/L — ABNORMAL HIGH (ref 3.5–5.1)
Sodium: 136 mmol/L (ref 135–145)

## 2024-01-15 LAB — CBC
HCT: 34 % — ABNORMAL LOW (ref 39.0–52.0)
Hemoglobin: 10.7 g/dL — ABNORMAL LOW (ref 13.0–17.0)
MCH: 26 pg (ref 26.0–34.0)
MCHC: 31.5 g/dL (ref 30.0–36.0)
MCV: 82.5 fL (ref 80.0–100.0)
Platelets: 404 10*3/uL — ABNORMAL HIGH (ref 150–400)
RBC: 4.12 MIL/uL — ABNORMAL LOW (ref 4.22–5.81)
RDW: 14.6 % (ref 11.5–15.5)
WBC: 17.2 10*3/uL — ABNORMAL HIGH (ref 4.0–10.5)
nRBC: 0 % (ref 0.0–0.2)

## 2024-01-15 LAB — GLUCOSE, CAPILLARY
Glucose-Capillary: 218 mg/dL — ABNORMAL HIGH (ref 70–99)
Glucose-Capillary: 301 mg/dL — ABNORMAL HIGH (ref 70–99)
Glucose-Capillary: 372 mg/dL — ABNORMAL HIGH (ref 70–99)
Glucose-Capillary: 325 mg/dL — ABNORMAL HIGH (ref 70–99)

## 2024-01-15 MED ORDER — SODIUM ZIRCONIUM CYCLOSILICATE 10 G PO PACK
10.0000 g | PACK | Freq: Once | ORAL | Status: AC
  Administered 2024-01-15: 10 g via ORAL
  Filled 2024-01-15: qty 1

## 2024-01-15 NOTE — Plan of Care (Signed)

## 2024-01-15 NOTE — Progress Notes (Signed)
PROGRESS NOTE  Joseph [name]  HQI:[id number] DOB: 1971-11-11 DOA: 01/07/2024 PCP: Cain Saupe, MD   Brief Narrative: Patient is a 53 year old male with history of morbid obesity, diabetes type 2, hypertension, diastolic CHF, COPD, bilateral lower extremity BKA, CKD stage III with baseline creatinine of 1.7 who presented from home with complaint of fatigue, chest pain, shortness of breath, subjective fever, swelling of left BKA stump.  On presentation, he had leukocytosis with WBC count of 18.6, creatinine of 3.3.  Chest x-ray did not show any acute findings.  Started on ceftriaxone for cellulitis of left BKA stump.  Hospital course remarkable for urinary retention, Foley placed.  Orthopedics consulted for cellulitis of left BKA stump, antibiotics broadened .  Cellulitis of left BKA stump has improved.  Orthopedics recommended antibiotic, no intervention.  We recommend to follow-up with Dr. [name] as an outpatient.  DC plan tomorrow if potassium level, leukocytosis improve  Assessment & Plan:  Principal Problem:   Acute renal failure superimposed on stage 3a chronic kidney disease (HCC) Active Problems:   Acute urinary retention   COPD (chronic obstructive pulmonary disease) (HCC)   Leukocytosis   History of pulmonary embolus (PE)   Insulin dependent type 2 diabetes mellitus (HCC)   Anxiety and depression   Chronic back pain   BMI 70 and over, adult (HCC)   Heart failure with preserved ejection fraction (HCC)   Essential hypertension   Cellulitis of left lower leg   S/P bilateral BKA (below knee amputation) (HCC)   Acute kidney failure (HCC)   AKI on CKD stage IIIa/hyperkalemia: Baseline creatinine 1.7.  Presented with creatinine of more than 3.  Could be from bladder outlet obstruction.  Status post Foley placement.  Creatinine now has been in the range of 2.  He follows with  with [facility]..Follow-up with nephrology as an outpatient.  Given Lokelma for potassium of 5.5   Urine  retention: Foley placed here,now removed  .Continue flomax. He needs to follow-up with urology as an outpatient.  He has an appointment on Jan 31st.     Concern for left stump cellulitis: Presented with leukocytosis but no fever.  Blood cultures have been negative so far.   Dr. [name] recommending conservative management with antibiotics,no  plan for surgery.    Afebrile.  Continue Zyvox and Keflex   Follow-up with Dr. [name] in a week.  Leukocytosis slightly worse  Macular rash: Started on 1/25.  Not pruritic.  Likely from vancomycin.  Vancomycin has been stopped already.  Continue to monitor. Also developed right upper extremity swelling likely from IV infiltration.  Venous Doppler negative for DVT   COPD: Currently not on exacerbation.   Hypertension: Continue current medications.  Blood pressure stable   HFpEF: Echo done here showed EF of 55 to 60%.  Diuretics on hold. Takes aspirin, Plavix, Ranexa, statin at home.  Currently appears euvolemic   Chronic back pain: Continue pain management, supportive care   Anxiety/depression: On Wellbutrin, Xanax   Insulin-dependent diabetes type 2: Diabetic coordinator  was following.  Takes metformin, Farxiga ,insulin at home.  Both oral meds on  hold due to CKD.  Continue current insulin regimen. Diabetic coordinator was following.  Blood sugars high mostly from noncompliance with diet   History of PE: Currently not on anticoagulation   History of bilateral BKA/debility: Patient lives with his family at home.  Ambulates with the help of wheelchair.    Morbid obesity: BMI of more than 70  DVT prophylaxis:heparin injection 5,000 Units Start: 01/08/24 1400     Code Status: Full Code  Family Communication: None at the bedside  Patient status:Inpatient  Patient is from :home  Anticipated discharge WG:NFAO  Estimated DC date: tomorrow   Consultants: Orthopedics  Procedures:None  Antimicrobials:  Anti-infectives (From  admission, onward)    Start     Dose/Rate Route Frequency Ordered Stop   01/15/24 0000  linezolid (ZYVOX) 600 MG tablet        600 mg Oral 2 times daily 01/14/24 1413 01/22/24 2359   01/15/24 0000  cephALEXin (KEFLEX) 500 MG capsule        500 mg Oral 3 times daily 01/14/24 1413 01/22/24 2359   01/14/24 2200  cephALEXin (KEFLEX) capsule 500 mg        500 mg Oral Every 8 hours 01/14/24 1700     01/14/24 2200  linezolid (ZYVOX) tablet 600 mg        600 mg Oral Every 12 hours 01/14/24 1700     01/14/24 1000  vancomycin (VANCOCIN) 2,500 mg in sodium chloride 0.9 % 500 mL IVPB  Status:  Discontinued        2,500 mg 262.5 mL/hr over 120 Minutes Intravenous Every 48 hours 01/12/24 0845 01/14/24 1700   01/12/24 0930  vancomycin (VANCOCIN) 2,500 mg in sodium chloride 0.9 % 500 mL IVPB        2,500 mg 262.5 mL/hr over 120 Minutes Intravenous  Once 01/12/24 0838 01/12/24 1708   01/11/24 0800  cefTRIAXone (ROCEPHIN) 2 g in sodium chloride 0.9 % 100 mL IVPB  Status:  Discontinued        2 g 200 mL/hr over 30 Minutes Intravenous Every 24 hours 01/10/24 1040 01/14/24 1700   01/10/24 1130  cefTRIAXone (ROCEPHIN) 1 g in sodium chloride 0.9 % 100 mL IVPB        1 g 200 mL/hr over 30 Minutes Intravenous  Once 01/10/24 1040 01/10/24 1230   01/08/24 0800  cefTRIAXone (ROCEPHIN) 1 g in sodium chloride 0.9 % 100 mL IVPB  Status:  Discontinued        1 g 200 mL/hr over 30 Minutes Intravenous Every 24 hours 01/08/24 0705 01/10/24 1040   01/07/24 1000  fluconazole (DIFLUCAN) tablet 100 mg        100 mg Oral Every Sat 01/07/24 0455         Subjective: Seen and examined the bedside today.  He feels better.  Voiding spontaneously.  Rash on the right upper extremity has not worsened.  Nonpruritic.  There is rash also on left BKA stump and right leg.  Objective: Vitals:   01/14/24 0449 01/14/24 0943 01/15/24 0320 01/15/24 0814  BP: (!) 142/88 127/60 (!) 161/93 (!) 146/90  Pulse: 79 88 80 63  Resp: 15 18 18     Temp: 97.7 F (36.5 C) 97.8 F (36.6 C) 98 F (36.7 C) 98.2 F (36.8 C)  TempSrc:      SpO2: 97% 96% 98% 93%  Weight:      Height:        Intake/Output Summary (Last 24 hours) at 01/15/2024 1003 Last data filed at 01/15/2024 0900 Gross per 24 hour  Intake 440 ml  Output 1725 ml  Net -1285 ml   Filed Weights   01/07/24 0300  Weight: (!) 173.5 kg    Examination:  General exam: Overall comfortable, not in distress, morbidly obese HEENT: PERRL Respiratory system:  no wheezes or crackles  Cardiovascular system: S1 &  S2 heard, RRR.  Gastrointestinal system: Abdomen is nondistended, soft and nontender. Central nervous system: Alert and oriented Extremities: Bilateral BKA, edema, erythema of left BKA stump  skin: Scattered macular rash   Data Reviewed: I have personally reviewed following labs and imaging studies  CBC: Recent Labs  Lab 01/09/24 0607 01/10/24 1024 01/11/24 0607 01/12/24 0701 01/13/24 0702 01/14/24 0745 01/15/24 0729  WBC 13.2*   < > 13.9* 16.1* 12.4* 14.5* 17.2*  NEUTROABS 10.5*  --   --   --   --   --   --   HGB 10.7*   < > 10.8* 11.0* 10.7* 11.2* 10.7*  HCT 34.2*   < > 34.0* 34.4* 34.0* 36.7* 34.0*  MCV 83.2   < > 83.1 82.3 83.3 84.4 82.5  PLT 233   < > 329 352 369 407* 404*   < > = values in this interval not displayed.   Basic Metabolic Panel: Recent Labs  Lab 01/11/24 0607 01/12/24 0701 01/13/24 0702 01/14/24 0745 01/15/24 0729  NA 133* 135 138 139 136  K 4.8 5.1 5.0 5.3* 5.5*  CL 102 104 108 107 107  CO2 22 21* 22 21* 21*  GLUCOSE 268* 202* 274* 251* 250*  BUN 49* 55* 54* 54* 54*  CREATININE 2.05* 2.17* 2.25* 1.98* 1.90*  CALCIUM 8.3* 8.4* 8.2* 8.5* 8.3*     Recent Results (from the past 240 hours)  Resp panel by RT-PCR (RSV, Flu A&B, Covid) Anterior Nasal Swab     Status: None   Collection Time: 01/07/24 12:51 AM   Specimen: Anterior Nasal Swab  Result Value Ref Range Status   SARS Coronavirus 2 by RT PCR NEGATIVE NEGATIVE  Final   Influenza A by PCR NEGATIVE NEGATIVE Final   Influenza B by PCR NEGATIVE NEGATIVE Final    Comment: (NOTE) The Xpert Xpress SARS-CoV-2/FLU/RSV plus assay is intended as an aid in the diagnosis of influenza from Nasopharyngeal swab specimens and should not be used as a sole basis for treatment. Nasal washings and aspirates are unacceptable for Xpert Xpress SARS-CoV-2/FLU/RSV testing.  Fact Sheet for Patients: BloggerCourse.com  Fact Sheet for Healthcare Providers: SeriousBroker.it  This test is not yet approved or cleared by the [place] FDA and has been authorized for detection and/or diagnosis of SARS-CoV-2 by FDA under an Emergency Use Authorization (EUA). This EUA will remain in effect (meaning this test can be used) for the duration of the COVID-19 declaration under Section 564(b)(1) of the Act, 21 U.S.C. section 360bbb-3(b)(1), unless the authorization is terminated or revoked.     Resp Syncytial Virus by PCR NEGATIVE NEGATIVE Final    Comment: (NOTE) Fact Sheet for Patients: BloggerCourse.com  Fact Sheet for Healthcare Providers: SeriousBroker.it  This test is not yet approved or cleared by the [place] FDA and has been authorized for detection and/or diagnosis of SARS-CoV-2 by FDA under an Emergency Use Authorization (EUA). This EUA will remain in effect (meaning this test can be used) for the duration of the COVID-19 declaration under Section 564(b)(1) of the Act, 21 U.S.C. section 360bbb-3(b)(1), unless the authorization is terminated or revoked.  Performed at [facility], [address], [place], [name] [address]   Culture, blood (Routine X 2) w Reflex to ID Panel     Status: None   Collection Time: 01/08/24  8:20 AM   Specimen: BLOOD LEFT ARM  Result Value Ref Range Status   Specimen Description BLOOD LEFT ARM  Final   Special Requests  Final    BOTTLES DRAWN AEROBIC AND ANAEROBIC Blood Culture results may not be optimal due to an inadequate volume of blood received in culture bottles   Culture   Final    NO GROWTH 5 DAYS Performed at [facility], [address] [place], [name] [address]    Report Status 01/13/2024 FINAL  Final  Culture, blood (Routine X 2) w Reflex to ID Panel     Status: None   Collection Time: 01/08/24  8:23 AM   Specimen: BLOOD RIGHT HAND  Result Value Ref Range Status   Specimen Description BLOOD RIGHT HAND  Final   Special Requests   Final    BOTTLES DRAWN AEROBIC AND ANAEROBIC Blood Culture results may not be optimal due to an inadequate volume of blood received in culture bottles   Culture   Final    NO GROWTH 5 DAYS Performed at [facility], [address], [place], [name] [address]    Report Status 01/13/2024 FINAL  Final     Radiology Studies: VAS [place] UPPER EXTREMITY VENOUS DUPLEX Result Date: 01/15/2024 UPPER VENOUS STUDY  Patient Name:  Joseph [name]  Date of Exam:   01/15/2024 Medical Rec #: [id number]        Accession #:    [id number] Date of Birth: [birth year]        Patient Gender: M Patient Age:   58 years Exam Location:  [facility] Procedure:      VAS [place] UPPER EXTREMITY VENOUS DUPLEX Referring Phys: Kayceon Oki --------------------------------------------------------------------------------  Indications: Right upper extremity edema and rash noted by MD to be most likely due to IV infiltration of Vancomycin Comparison Study: No prior study on file Performing Technologist: Sherren Kerns RVS  Examination Guidelines: A complete evaluation includes B-mode imaging, spectral Doppler, color Doppler, and power Doppler as needed of all accessible portions of each vessel. Bilateral testing is considered an integral part of a complete examination. Limited examinations for reoccurring indications may be performed as noted.  Right Findings:  +----------+------------+---------+-----------+----------+-------+ RIGHT     CompressiblePhasicitySpontaneousPropertiesSummary +----------+------------+---------+-----------+----------+-------+ IJV           Full       Yes       Yes                      +----------+------------+---------+-----------+----------+-------+ Subclavian               Yes       Yes                      +----------+------------+---------+-----------+----------+-------+ Axillary                 Yes       Yes                      +----------+------------+---------+-----------+----------+-------+ Brachial      Full       Yes       Yes                      +----------+------------+---------+-----------+----------+-------+ Radial        Full                                          +----------+------------+---------+-----------+----------+-------+ Ulnar  Full                                          +----------+------------+---------+-----------+----------+-------+ Cephalic      Full                                          +----------+------------+---------+-----------+----------+-------+ Basilic       Full                                          +----------+------------+---------+-----------+----------+-------+  Left Findings: +----------+------------+---------+-----------+----------+-------+ LEFT      CompressiblePhasicitySpontaneousPropertiesSummary +----------+------------+---------+-----------+----------+-------+ Subclavian               Yes       Yes                      +----------+------------+---------+-----------+----------+-------+  Summary:  Right: No evidence of deep vein thrombosis in the upper extremity. No evidence of superficial vein thrombosis in the upper extremity. Interstitial edema noted throughout right upper extremity.  Left: No evidence of thrombosis in the subclavian.  *See table(s) above for measurements and observations.     Preliminary      Scheduled Meds:  aspirin EC  81 mg Oral Daily   atorvastatin  80 mg Oral Daily   bisacodyl  10 mg Rectal Once   buPROPion  150 mg Oral Daily   cephALEXin  500 mg Oral Q8H   Chlorhexidine Gluconate Cloth  6 each Topical Daily   clopidogrel  75 mg Oral Daily   escitalopram  10 mg Oral Daily   finasteride  5 mg Oral Daily   fluconazole  100 mg Oral Q Sat   fluticasone  2 spray Each Nare Daily   heparin injection (subcutaneous)  5,000 Units Subcutaneous Q8H   icosapent Ethyl  2 g Oral BID   influenza vac split trivalent PF  0.5 mL Intramuscular Tomorrow-1000   insulin aspart  0-15 Units Subcutaneous TID WC   insulin aspart  0-5 Units Subcutaneous QHS   insulin aspart  8 Units Subcutaneous TID WC   insulin glargine-yfgn  38 Units Subcutaneous BID   ketotifen  1 drop Left Eye BID   lidocaine  1 patch Transdermal Q24H   linezolid  600 mg Oral Q12H   loratadine  10 mg Oral Daily   pantoprazole  40 mg Oral Daily   polyethylene glycol  17 g Oral BID   pregabalin  75 mg Oral BID   ranolazine  500 mg Oral BID   senna  2 tablet Oral BID   tamsulosin  0.4 mg Oral Daily   Continuous Infusions:     LOS: 6 days   Burnadette Pop, MD [facility] P1/26/2025, 10:03 AM

## 2024-01-15 NOTE — Progress Notes (Signed)
VASCULAR LAB    Right upper extremity venous duplex has been performed.  See CV proc for preliminary results.   Maylen Waltermire, RVT 01/15/2024, 9:35 AM

## 2024-01-15 NOTE — Progress Notes (Deleted)
Cardiology Office Note    Patient Name: Joseph [name] Date of Encounter: 01/15/2024  Primary Care Provider:  Cain Saupe, MD Primary Cardiologist:  Donato Schultz, MD Primary Electrophysiologist: None   Past Medical History    Past Medical History:  Diagnosis Date   Aneurysm of ascending aorta (HCC) 09/14/2021   Anxiety    Aortic atherosclerosis (HCC) 11/28/2023   Arthritis    "knees, shoulders, hips, ankles" (07/29/2016)   Asthma    Bicuspid aortic valve 09/14/2021   BMI 70 and over, adult (HCC) 02/16/2019   CAD (coronary artery disease)    a. 2017: s/p BMS to distal Cx; b. LHC 05/30/2019: 80% mid, distal RCA s/p DES, 30% narrowing of d LM, widely patent LAD w/ luminal irregularities, widely patent stent in dCX  w/ 90+% stenosis distal to stent beofre small trifurcating obtuse marginal (potentially area of restenosis)   Chronic bronchitis (HCC)    Chronic ulcer of great toe of right foot (HCC) 09/09/2017   Chronic ulcer of right great toe (HCC) 09/19/2017   COPD (chronic obstructive pulmonary disease) (HCC)    Coronary arteriosclerosis    Deep venous thrombosis (HCC) 02/16/2019   Depression    Diabetes mellitus (HCC)    DVT (deep venous thrombosis) (HCC)    GERD (gastroesophageal reflux disease)    Hyperlipidemia    Hypertension    Migraine    Morbid obesity (HCC)    Morbid obesity with BMI of 70 and over, adult (HCC) 11/28/2023   Myocardial infarction (HCC) 1996   "light one"   Non-ST elevation (NSTEMI) myocardial infarction (HCC) 07/22/2020   PE (pulmonary embolism) 04/2013   On chronic Xarelto   Peripheral nerve disease    Pneumonia "several times"   Sleep apnea    Type II diabetes mellitus (HCC)     History of Present Illness  Joseph [name] is a 53 y.o. male with PMH of hx of CAD s/p dLCx and s/p CABG in 2021(LIMA-->LAD, SVG-->PDA, OM1, D1), PAD with stents to the R extremity with prior R and L toe amputations, chronic ulcer of both feet great toe, COPD with  significant prior smoking history, HTN, HLD, ischemic cardiomyopathy, DM2 with diabetes neuropathy, prior hx of morbid Obesity, OSA unclear if on CPAP, hx of DVT and PE on Xarelto who presents today for hospital follow-up.  Joseph [name] has a PMH of PE with CAD with BMS to distal circumflex and CABG completed in 07/2020 with (LIMA-->LAD, SVG-->PDA, OM1, D1).  He had 2 stents placed to popliteal and left SFA with partial amputation of his foot.  He underwent left below-knee amputation in 12/2021. He was admitted 05/01/2023 for exacerbation of CHF. He was given Lasix with his significant edema was seen by nephrology for possible nephrotic syndrome. Creatinine was 2.54 and patient had complaint of chest pain with reassuring troponins completed. He was discharged with plan for diuresis and nephrology follow-up.  He was seen by Tereso Newcomer, PA on 04/25/2023 he was scheduled for cardiac follow-up on 06/07/2023 however was a no-show.  He was admitted on 10/01/2023 after suffering a fall and admitted on 11/26/2023 with lower abdominal pain and hematuria.  He was admitted and treated for emphysematous cystitis with 14-day course of antibiotics and Foley catheter in place with urology follow-up.  Hospitalization was prolonged due to hyperkalemia and was treated with [facility].    He was readmitted on 01/07/2024 with complaint of chest pain he endorsed with and without exertion.  He also endorsed shortness of  breath and fatigue.  Chest x-ray was completed and did not show any acute findings and WBCs were elevated at 18.6.  He was started on ceftriaxone for cellulitis of the left BKA stump and had Foley catheter placed due to urinary retention.  Creatinine was 3 on arrival with reduction to 1.2 prior to discharge.  He was advised to follow-up with [facility] and was given [facility] for potassium of 5.3.  Blood pressure was found to be stable and diuretics were held in the setting of euvolemia and elevated creatinine.   During  today's visit the patient reports*** .  Patient denies chest pain, palpitations, dyspnea, PND, orthopnea, nausea, vomiting, dizziness, syncope, edema, weight gain, or early satiety.  ***Notes: -Last ischemic evaluation: -Last echo: -Interim ED visits: Review of Systems  Please see the history of present illness.    All other systems reviewed and are otherwise negative except as noted above.  Physical Exam    Wt Readings from Last 3 Encounters:  01/07/24 (!) 382 lb 8 oz (173.5 kg)  11/27/23 (!) 382 lb 8 oz (173.5 kg)  10/01/23 (!) 320 lb (145.2 kg)   AV:WUJWJ were no vitals filed for this visit.,There is no height or weight on file to calculate BMI. GEN: Well nourished, well developed in no acute distress Neck: No JVD; No carotid bruits Pulmonary: Clear to auscultation without rales, wheezing or rhonchi  Cardiovascular: Normal rate. Regular rhythm. Normal S1. Normal S2.   Murmurs: There is no murmur.  ABDOMEN: Soft, non-tender, non-distended EXTREMITIES:  No edema; No deformity   EKG/LABS/ Recent Cardiac Studies   ECG personally reviewed by me today - ***  Risk Assessment/Calculations:   {Does this patient have ATRIAL FIBRILLATION?:[phone]}      Lab Results  Component Value Date   WBC 17.2 (H) 01/15/2024   HGB 10.7 (L) 01/15/2024   HCT 34.0 (L) 01/15/2024   MCV 82.5 01/15/2024   PLT 404 (H) 01/15/2024   Lab Results  Component Value Date   CREATININE 1.90 (H) 01/15/2024   BUN 54 (H) 01/15/2024   NA 136 01/15/2024   K 5.5 (H) 01/15/2024   CL 107 01/15/2024   CO2 21 (L) 01/15/2024   Lab Results  Component Value Date   CHOL 136 05/02/2023   HDL 40 (L) 05/02/2023   LDLCALC 78 05/02/2023   LDLDIRECT 120.0 05/28/2015   TRIG 89 05/02/2023   CHOLHDL 3.4 05/02/2023    Lab Results  Component Value Date   HGBA1C 8.1 (H) 01/07/2024   Assessment & Plan    1.HFpEF: -Patient's last 2D echo was completed with EF of 55-60, no RWMA, moderate LVH, GRII DD, normal RVSF,  trivial MR,   2.Coronary artery disease: -s/p CABG in 2021 with most recent right and left heart catheterization completed with clear grafts and normal RV pressure.   3.Bicuspid aortic valve: -Patient had diastolic murmur auscultated on examination today.   4.Nephrotic syndrome:   5.  Bilateral BKA:          Disposition: Follow-up with Donato Schultz, MD or APP in *** months {Are you ordering a CV Procedure (e.g. stress test, cath, DCCV, TEE, etc)?   Press F2        :[id number]}   Signed, Napoleon Form, Leodis Rains, NP 01/15/2024, 2:45 PM [facility]

## 2024-01-16 ENCOUNTER — Ambulatory Visit: Payer: 59 | Attending: Nurse Practitioner | Admitting: Nurse Practitioner

## 2024-01-16 DIAGNOSIS — S88112D Complete traumatic amputation at level between knee and ankle, left lower leg, subsequent encounter: Secondary | ICD-10-CM

## 2024-01-16 DIAGNOSIS — I5032 Chronic diastolic (congestive) heart failure: Secondary | ICD-10-CM

## 2024-01-16 DIAGNOSIS — Q2381 Bicuspid aortic valve: Secondary | ICD-10-CM

## 2024-01-16 DIAGNOSIS — I25119 Atherosclerotic heart disease of native coronary artery with unspecified angina pectoris: Secondary | ICD-10-CM

## 2024-01-16 LAB — BASIC METABOLIC PANEL
Anion gap: 8 (ref 5–15)
BUN: 51 mg/dL — ABNORMAL HIGH (ref 6–20)
CO2: 22 mmol/L (ref 22–32)
Calcium: 8.4 mg/dL — ABNORMAL LOW (ref 8.9–10.3)
Chloride: 108 mmol/L (ref 98–111)
Creatinine, Ser: 1.78 mg/dL — ABNORMAL HIGH (ref 0.61–1.24)
GFR, Estimated: 46 mL/min — ABNORMAL LOW (ref >60)
Glucose, Bld: 278 mg/dL — ABNORMAL HIGH (ref 70–99)
Potassium: 4.7 mmol/L (ref 3.5–5.1)
Sodium: 138 mmol/L (ref 135–145)

## 2024-01-16 LAB — CBC
HCT: 32.6 % — ABNORMAL LOW (ref 39.0–52.0)
Hemoglobin: 10.2 g/dL — ABNORMAL LOW (ref 13.0–17.0)
MCH: 26.2 pg (ref 26.0–34.0)
MCHC: 31.3 g/dL (ref 30.0–36.0)
MCV: 83.8 fL (ref 80.0–100.0)
Platelets: 406 10*3/uL — ABNORMAL HIGH (ref 150–400)
RBC: 3.89 MIL/uL — ABNORMAL LOW (ref 4.22–5.81)
RDW: 14.6 % (ref 11.5–15.5)
WBC: 15.9 10*3/uL — ABNORMAL HIGH (ref 4.0–10.5)
nRBC: 0 % (ref 0.0–0.2)

## 2024-01-16 LAB — GLUCOSE, CAPILLARY: Glucose-Capillary: 215 mg/dL — ABNORMAL HIGH (ref 70–99)

## 2024-01-16 NOTE — Progress Notes (Signed)
Patient seen and examined the bedside today. Comfortable. Kidney function has improved today. He is voiding by himself. Had a bowel movement yesterday. Rash has not progressed. He is medically stable for discharge to home today.  Discharge summary and orders already placed on 1/26.  Meds have been delivered to the room by [facility] pharmacy.

## 2024-01-16 NOTE — Progress Notes (Signed)
Physical Therapy Treatment Patient Details Name: Joseph [name] MRN: [id number] DOB: Oct 09, 1971 Today's Date: 01/16/2024   History of Present Illness Patient is 53 y.o. male presented with cellulitis of Lt BKA. Pt s/p Lt BKA in Jan '23 and Rt BKA in July '23. PMH significant for anxiety, OA, asthma, CADs/p CABGx4, COPD, DVT, DM, GERD, HTN. HLD, morbid obesity, NSTEMI, PE, anxiety, depression.    PT Comments  Pt received sitting EOB with nursing preparing for discharge. Pt able to get dressed without assist. Pt able to instruct in set up of w/c and manage w/c parts for lateral transfer. Pt able to perform transfer with up to supervision for safety. Discussed home set up for [facility] and safe plan for completing necessary mobility due to shoulder and B knee pain. Pt reports feeling comfortable with transfers and mobility at discharge with family assisting as needed. Anticipate pt and family will be able to manage pt's mobility needs at home once medically ready for discharge.    If plan is discharge home, recommend the following: A lot of help with walking and/or transfers;A little help with bathing/dressing/bathroom;Assistance with cooking/housework;Assist for transportation;Help with stairs or ramp for entrance   Can travel by private vehicle        Equipment Recommendations  None recommended by PT    Recommendations for Other Services       Precautions / Restrictions Precautions Precautions: Fall Precaution Comments: bil BKA, Lt LE cellulitis/wounds Restrictions Weight Bearing Restrictions Per Provider Order: No     Mobility  Bed Mobility Overal bed mobility: Needs Assistance Bed Mobility: Supine to Sit     Supine to sit: Supervision     General bed mobility comments: increased time    Transfers Overall transfer level: Needs assistance Equipment used: None Transfers: Bed to chair/wheelchair/BSC            Lateral/Scoot Transfers: Supervision General transfer comment:  From EOB to w/c with pt instructing in set up and able to manage w/c parts with assist to lock outside brake. Pt elevates EOB and performs lateral transfer with supervision for Electronics engineer Wheelchair mobility:  (Pt able to maneuver in room and manage w/c parts without assist)      Balance Overall balance assessment: No apparent balance deficits (not formally assessed)                                          Cognition Arousal: Alert Behavior During Therapy: WFL for tasks assessed/performed Overall Cognitive Status: Within Functional Limits for tasks assessed                                          Exercises      General Comments        Pertinent Vitals/Pain Pain Assessment Pain Assessment: Faces Faces Pain Scale: No hurt     PT Goals (current goals can now be found in the care plan section) Acute Rehab PT Goals Patient Stated Goal: recover, leg to stop hurting, and return home PT Goal Formulation: With patient Time For Goal Achievement: 01/26/24 Progress towards PT goals: Progressing toward goals    Frequency    Min 1X/week       AM-PAC PT "6 Clicks" Mobility   Outcome Measure  Help needed turning from your back to your side while in a flat bed without using bedrails?: A Little Help needed moving from lying on your back to sitting on the side of a flat bed without using bedrails?: A Little Help needed moving to and from a bed to a chair (including a wheelchair)?: A Little Help needed standing up from a chair using your arms (e.g., wheelchair or bedside chair)?: Total Help needed to walk in hospital room?: Total Help needed climbing 3-5 steps with a railing? : Total 6 Click Score: 12    End of Session   Activity Tolerance: Patient tolerated treatment well Patient left: Other (comment) (in w/c with nursing aware) Nurse Communication: Mobility status (pt in w/c) PT Visit Diagnosis:  Muscle weakness (generalized) (M62.81);Difficulty in walking, not elsewhere classified (R26.2);Other abnormalities of gait and mobility (R26.89)     Time: 1210-1229 PT Time Calculation (min) (ACUTE ONLY): 19 min  Charges:    $Therapeutic Activity: 8-22 mins PT General Charges $$ ACUTE PT VISIT: 1 Visit                     Johny Shock, PTA Acute Rehabilitation Services Secure Chat Preferred  [phone]    Johny Shock 01/16/2024, 1:08 PM

## 2024-01-16 NOTE — TOC Progression Note (Signed)
Transition of Care [facility]) - Progression Note    Patient Details  Name: ABDO DENAULT MRN: [id number] Date of Birth: 09/24/1971  Transition of Care [facility]) CM/SW Contact  Janae Bridgeman, RN Phone Number: 01/16/2024, 11:05 AM  Clinical Narrative:    CM met with the patient at the bedside and [facility] was present in the patient's hospital room for discharge home today.. Patient was unable to obtain a ride home with family today and patient needs WC transportation assistance to home today.  Patient signed Ride Waiver and form placed in the hard chart.  [place] transportation for [facility]ena[facility] Niece transport was schedule and [facility] Zena[facility] Niece will be at the [employer] at 12:45.  Bedside nursing was updated to have the patient at the [place] tower for pickup at this time.   Expected Discharge Plan: Home/Self Care Barriers to Discharge: Continued Medical Work up  Expected Discharge Plan and Services   Discharge Planning Services: CM Consult Post Acute Care Choice: Durable Medical Equipment Living arrangements for the past 2 months: Single Family Home Expected Discharge Date: 01/16/24               DME Arranged: Wheelchair manual DME Agency: [employer] Date DME Agency Contacted: 01/11/24   Representative spoke with at DME Agency: Timothy Lasso             Social Determinants of Health (SDOH) Interventions SDOH Screenings   Food Insecurity: No Food Insecurity (01/07/2024)  Housing: Low Risk  (01/07/2024)  Transportation Needs: No Transportation Needs (01/07/2024)  Recent Concern: Transportation Needs - Unmet Transportation Needs (11/26/2023)  Utilities: Not At Risk (01/07/2024)  Depression (PHQ2-9): Low Risk  (05/06/2021)  Financial Resource Strain: Medium Risk (09/09/2022)  Social Connections: Moderately Isolated (01/07/2024)  Stress: No Stress Concern Present (10/10/2023)   Received from [facility]  Tobacco Use: Medium Risk (01/07/2024)    Readmission Risk Interventions    05/03/2023   11:58 AM  11/24/2021    4:05 PM 08/20/2021   12:26 PM  Readmission Risk Prevention Plan  Transportation Screening Complete Complete Complete  PCP or Specialist Appt within 3-5 Days Complete    HRI or Home Care Consult Complete    Social Work Consult for Recovery Care Planning/Counseling Complete    Palliative Care Screening Not Applicable    Medication Review Oceanographer) Referral to Pharmacy Complete Complete  PCP or Specialist appointment within 3-5 days of discharge  Complete Complete  HRI or Home Care Consult  Complete Complete  SW Recovery Care/Counseling Consult  Complete Complete  Palliative Care Screening  Not Applicable Not Applicable  Skilled Nursing Facility  Complete Not Applicable

## 2024-01-23 ENCOUNTER — Ambulatory Visit: Payer: Medicare HMO | Admitting: Gastroenterology

## 2024-01-23 ENCOUNTER — Ambulatory Visit: Payer: 59 | Admitting: Orthopedic Surgery

## 2024-01-23 NOTE — Progress Notes (Deleted)
 Chief Complaint: Diarrhea Primary GI MD: Dr. Russella Dar (only seen inpatient)  HPI: 53 year old male history of insulin-dependent type 2 diabetes, COPD, HFpEF, CAD s/p PCI 2017 and CABG in 2021 with cardiac catheterization 2022/2023, aneurysm of ascending aorta, DVT/PE, on plavix and ASA, bilateral BKA, and others as listed below presents for evaluation of diarrhea  Patient was last seen by cardiology May 2024 for a preop exam prior to eye surgery.  At that time he was deemed to be high risk for any procedure but given the low risk nature of his eye procedure they deemed him acceptable risk with holding aspirin and Plavix for 5 days.  Echocardiogram 04/27/2023 with ejection fraction 55 to 60%  Patient had 2 admissions.  1 in December and 1 in January most recently.  He was admitted for cystitis and acute renal failure.  He was also found to be severely constipated.  CT abdomen pelvis without contrast 11/26/2023 for hematuria and abdominal pain showed a emphysematous cystitis.  No other inflammatory process.  Cholelithiasis  Abdominal x-ray 12/02/2023 showed moderate stool burden with nonobstructive gas pattern  Patient's recent admission 1/18 to 01/16/2024 in which he was found to have cellulitis of left BKA stump and had fatigue, chest pain, shortness of breath, fever as well as leukocytosis.  Patient was put on antibiotics and discharged to follow-up outpatient.  Recent CBC 01/16/2024 showed improving leukocytosis 15.9.  Hgb 10.2, baseline, MCV 83.8  Discussed the use of AI scribe software for clinical note transcription with the patient, who gave verbal consent to proceed.  History of Present Illness             PREVIOUS GI WORKUP   EGD done as inpatient for upper abdominal pain and weight loss with Dr. Russella Dar - Normal esophagus.  - A large amount of food ( residue) in the stomach- gastroparesis suspected.  - Duodenal erosions without bleeding.  - Normal duodenal bulb.  Past Medical  History:  Diagnosis Date   Aneurysm of ascending aorta (HCC) 09/14/2021   Anxiety    Aortic atherosclerosis (HCC) 11/28/2023   Arthritis    "knees, shoulders, hips, ankles" (07/29/2016)   Asthma    Bicuspid aortic valve 09/14/2021   BMI 70 and over, adult (HCC) 02/16/2019   CAD (coronary artery disease)    a. 2017: s/p BMS to distal Cx; b. LHC 05/30/2019: 80% mid, distal RCA s/p DES, 30% narrowing of d LM, widely patent LAD w/ luminal irregularities, widely patent stent in dCX  w/ 90+% stenosis distal to stent beofre small trifurcating obtuse marginal (potentially area of restenosis)   Chronic bronchitis (HCC)    Chronic ulcer of great toe of right foot (HCC) 09/09/2017   Chronic ulcer of right great toe (HCC) 09/19/2017   COPD (chronic obstructive pulmonary disease) (HCC)    Coronary arteriosclerosis    Deep venous thrombosis (HCC) 02/16/2019   Depression    Diabetes mellitus (HCC)    DVT (deep venous thrombosis) (HCC)    GERD (gastroesophageal reflux disease)    Hyperlipidemia    Hypertension    Migraine    Morbid obesity (HCC)    Morbid obesity with BMI of 70 and over, adult (HCC) 11/28/2023   Myocardial infarction (HCC) 1996   "light one"   Non-ST elevation (NSTEMI) myocardial infarction (HCC) 07/22/2020   PE (pulmonary embolism) 04/2013   On chronic Xarelto   Peripheral nerve disease    Pneumonia "several times"   Sleep apnea    Type II diabetes  mellitus [facility])     Past Surgical History:  Procedure Laterality Date   ABDOMINAL AORTOGRAM W/LOWER EXTREMITY N/A 10/10/2020   Procedure: ABDOMINAL AORTOGRAM W/LOWER EXTREMITY;  Surgeon: Sherren Kerns, MD;  [facility]: [facility];  Service: Cardiovascular;  Laterality: N/A;   AMPUTATION Right 09/21/2017   Procedure: RIGHT GREAT TOE AMPUTATION, POSSIBLE VAC;  Surgeon: Tarry Kos, MD;  Location: [facility] OR;  Service: Orthopedics;  Laterality: Right;   AMPUTATION Left 08/13/2020   Procedure: LEFT FOOT 4TH RAY AMPUTATION;   Surgeon: [name], MD;  Location: [facility] OR;  Service: Orthopedics;  Laterality: Left;   AMPUTATION Left 11/20/2021   Procedure: LEFT TRANSMETATARSAL AMPUTATION;  Surgeon: [name], MD;  Location: [facility] OR;  Service: Orthopedics;  Laterality: Left;   AMPUTATION Left 01/08/2022   Procedure: LEFT BELOW KNEE AMPUTATION;  Surgeon: [name], MD;  Location: [facility] OR;  Service: Orthopedics;  Laterality: Left;   AMPUTATION Right 07/07/2022   Procedure: RIGHT BELOW KNEE AMPUTATION;  Surgeon: [name], MD;  Location: [facility] OR;  Service: Orthopedics;  Laterality: Right;   AORTOGRAM Bilateral 03/13/2021   Procedure: ABDOMINAL AORTOGRAM WITH Left LOWER EXTREMITY RUNOFF;  Surgeon: Leonie Douglas, MD;  Location: [facility] OR;  Service: Vascular;  Laterality: Bilateral;   APPLICATION OF WOUND VAC  01/08/2022   Procedure: APPLICATION OF WOUND VAC;  Surgeon: [name], MD;  Location: [facility] OR;  Service: Orthopedics;;   APPLICATION OF WOUND VAC Right 07/07/2022   Procedure: APPLICATION OF WOUND VAC;  Surgeon: [name], MD;  [facility]: [facility] OR;  Service: Orthopedics;  Laterality: Right;   CARDIAC CATHETERIZATION  2006   CARDIAC CATHETERIZATION  1996   "@ [facility]; when I had my heart attack"   CARDIAC CATHETERIZATION N/A 07/29/2016   Procedure: Left Heart Cath and Coronary Angiography;  Surgeon: Runell Gess, MD;  Location: [facility] INVASIVE CV LAB;  Service: Cardiovascular;  Laterality: N/A;   CARDIAC CATHETERIZATION N/A 07/29/2016   Procedure: Coronary Stent Intervention;  Surgeon: Runell Gess, MD;  [facility]: [facility];  Service: Cardiovascular;  Laterality: N/A;   CARPAL TUNNEL RELEASE Bilateral    CORONARY ANGIOPLASTY WITH STENT PLACEMENT  07/29/2016   CORONARY ARTERY BYPASS GRAFT N/A 07/24/2020   Procedure: CORONARY ARTERY BYPASS GRAFTING (CABG), ON PUMP, TIMES FOUR, USING LEFT INTERNAL MAMMARY ARTERY AND ENDOSCOPICALLY HARVESTED RIGHT GREATER SAPHENOUS VEIN;  Surgeon: Corliss Skains, MD;   Location: [facility] OR;  Service: Open Heart Surgery;  Laterality: N/A;  FLOW TAC   CORONARY PRESSURE/FFR STUDY N/A 07/22/2020   Procedure: INTRAVASCULAR PRESSURE WIRE/FFR STUDY;  Surgeon: Marykay Lex, MD;  Location: [facility] INVASIVE CV LAB;  Service: Cardiovascular;  Laterality: N/A;   CORONARY STENT INTERVENTION N/A 05/30/2019   Procedure: CORONARY STENT INTERVENTION;  Surgeon: Lyn Records, MD;  [facility]: [facility];  Service: Cardiovascular;  Laterality: N/A;   CORONARY STENT INTERVENTION N/A 11/02/2019   Procedure: CORONARY STENT INTERVENTION;  Surgeon: Iran Ouch, MD;  [facility];  Service: Cardiovascular;  Laterality: N/A;   ESOPHAGOGASTRODUODENOSCOPY N/A 09/22/2017   Procedure: ESOPHAGOGASTRODUODENOSCOPY (EGD);  Surgeon: Meryl Dare, MD;  Location: [facility] ENDOSCOPY;  Service: Endoscopy;  Laterality: N/A;   KNEE ARTHROSCOPY Bilateral    "2 on left; 1 on the right"   LEFT HEART CATH AND CORONARY ANGIOGRAPHY N/A 11/02/2019   Procedure: LEFT HEART CATH AND CORONARY ANGIOGRAPHY;  Surgeon: Iran Ouch, MD;  [facility]: [facility];  Service: Cardiovascular;  Laterality: N/A;   LEFT HEART CATH AND CORONARY ANGIOGRAPHY N/A 07/22/2020   Procedure: LEFT HEART CATH AND CORONARY ANGIOGRAPHY;  Surgeon: Marykay Lex, MD;  Location: [facility] INVASIVE CV LAB;  Service: Cardiovascular;  Laterality: N/A;   LEFT HEART CATH AND CORS/GRAFTS ANGIOGRAPHY N/A 08/17/2021   Procedure: LEFT HEART CATH AND CORS/GRAFTS ANGIOGRAPHY;  Surgeon: Yvonne Kendall, MD;  [facility]: [facility];  Service: Cardiovascular;  Laterality: N/A;   PERIPHERAL VASCULAR INTERVENTION Left 10/11/2020   popliteal and SFA stent placement    PERIPHERAL VASCULAR INTERVENTION Left 10/10/2020   Procedure: PERIPHERAL VASCULAR INTERVENTION;  Surgeon: Sherren Kerns, MD;  [facility];  Service: Cardiovascular;  Laterality: Left;   RIGHT/LEFT HEART CATH AND CORONARY ANGIOGRAPHY N/A  05/30/2019   Procedure: RIGHT/LEFT HEART CATH AND CORONARY ANGIOGRAPHY;  Surgeon: Lyn Records, MD;  [facility];  Service: Cardiovascular;  Laterality: N/A;   RIGHT/LEFT HEART CATH AND CORONARY/GRAFT ANGIOGRAPHY N/A 06/17/2022   Procedure: RIGHT/LEFT HEART CATH AND CORONARY/GRAFT ANGIOGRAPHY;  Surgeon: Lennette Bihari, MD;  [facility]: [facility];  Service: Cardiovascular;  Laterality: N/A;   SHOULDER OPEN ROTATOR CUFF REPAIR Bilateral    TEE WITHOUT CARDIOVERSION N/A 07/24/2020   Procedure: TRANSESOPHAGEAL ECHOCARDIOGRAM (TEE);  Surgeon: Corliss Skains, MD;  Location: [facility] OR;  Service: Open Heart Surgery;  Laterality: N/A;    Current Outpatient Medications  Medication Sig Dispense Refill   acetaminophen (TYLENOL) 500 MG tablet Take 1,000 mg by mouth 3 (three) times daily as needed for headache.     albuterol (VENTOLIN HFA) 108 (90 Base) MCG/ACT inhaler Inhale 2 puffs into the lungs every 6 (six) hours as needed for shortness of breath.     ALPRAZolam (XANAX) 1 MG tablet Take 1 tablet (1 mg total) by mouth 2 (two) times daily as needed. (Patient taking differently: Take 1 mg by mouth 2 (two) times daily as needed for anxiety or sleep.) 60 tablet 0   aspirin EC 81 MG tablet Take 81 mg by mouth in the morning. Swallow whole.     atorvastatin (LIPITOR) 80 MG tablet TAKE ONE TABLET (80 MG TOTAL) BY MOUTH DAILY AT 6PM APPOINTMENT OVERDUE: NEEDS APPOINTMENT FOR FURTHER REFILLS PLEASE CALL OFFICE (Patient taking differently: Take 80 mg by mouth daily.) 30 tablet 1   buPROPion (WELLBUTRIN XL) 150 MG 24 hr tablet Take 150 mg by mouth in the morning and at bedtime.     carvedilol (COREG) 6.25 MG tablet TAKE ONE TABLET (6.25 MG TOTAL) BY MOUTH TWICE DAILY @9AM -5PM (Patient taking differently: Take 6.25 mg by mouth 2 (two) times daily with a meal.) 60 tablet 1   clopidogrel (PLAVIX) 75 MG tablet TAKE ONE TABLET (75 MG TOTAL) BY MOUTH DAILY AT 9AM WITH BREAKFAST APPOINTMENT OVERDUE:  NEEDS APPOINTMENT FOR FURTHER REFILLS, PLEASE CALL OFFICE (Patient taking differently: Take 75 mg by mouth daily.) 30 tablet 1   Continuous Blood Gluc Sensor (FREESTYLE LIBRE 14 DAY SENSOR) MISC Apply topically 4 (four) times daily.     escitalopram (LEXAPRO) 10 MG tablet Take 10 mg by mouth daily.     fluconazole (DIFLUCAN) 100 MG tablet Take 100 mg by mouth every Saturday.     fluticasone (FLONASE) 50 MCG/ACT nasal spray Place 2 sprays into both nostrils daily.     icosapent Ethyl (VASCEPA) 1 g capsule Take 2 capsules (2 g total) by mouth 2 (two) times daily. 120 capsule 3   insulin aspart (NOVOLOG) 100  UNIT/ML injection Inject 8 Units into the skin 3 (three) times daily with meals. Hold if eats less than 50% of meals 15 mL 0   insulin glargine (LANTUS SOLOSTAR) 100 UNIT/ML Solostar Pen Inject 38 Units into the skin 2 (two) times daily. 15 mL 1   Insulin Pen Needle 32G X 4 MM MISC Use as directed with insulin pens 100 each 0   ketotifen (ZADITOR) 0.035 % ophthalmic solution Place 1 drop into the left eye 2 (two) times daily. 3 mL 0   lidocaine (LIDODERM) 5 % Place 1 patch onto the skin daily.     loratadine (CLARITIN) 10 MG tablet Take 10 mg by mouth daily.     naloxone (NARCAN) nasal spray 4 mg/0.1 mL Place 4 mg into the nose as needed (overdose).     nitroGLYCERIN (NITROSTAT) 0.4 MG SL tablet Place 1 tablet (0.4 mg total) under the tongue every 5 (five) minutes as needed. (Patient taking differently: Place 0.4 mg under the tongue every 5 (five) minutes as needed for chest pain.) 25 tablet 3   oxyCODONE (ROXICODONE) 15 MG immediate release tablet Take 1 tablet (15 mg total) by mouth every 6 (six) hours as needed. 120 tablet 0   pantoprazole (PROTONIX) 40 MG tablet Take 1 tablet (40 mg total) by mouth daily. 30 tablet 0   pregabalin (LYRICA) 150 MG capsule Take 150 mg by mouth 3 (three) times daily.     promethazine (PHENERGAN) 25 MG tablet Take 25 mg by mouth every 4 (four) hours as needed for  nausea or vomiting.     ranolazine (RANEXA) 500 MG 12 hr tablet Take 1 tablet (500 mg total) by mouth 2 (two) times daily. 60 tablet 1   tamsulosin (FLOMAX) 0.4 MG CAPS capsule Take 1 capsule (0.4 mg total) by mouth daily. 30 capsule 0   tiZANidine (ZANAFLEX) 4 MG tablet Take 4 mg by mouth 3 (three) times daily.     No current facility-administered medications for this visit.    Allergies as of 01/23/2024 - Review Complete 01/07/2024  Allergen Reaction Noted   Sulfa antibiotics Other (See Comments) 10/06/2012    Family History  Problem Relation Age of Onset   Hypertension Brother    Hypertension Father    Diabetes Other    Hyperlipidemia Other     Social History   Socioeconomic History   Marital status: Married    Spouse name: Not on file   Number of children: Not on file   Years of education: Not on file   Highest education level: Not on file  Occupational History   Occupation: disabled     Employer: UNEMPLOYED  Tobacco Use   Smoking status: Former    Current packs/day: 0.00    Average packs/day: 1 pack/day for 34.0 years (34.0 ttl pk-yrs)    Types: Cigarettes    Start date: 07/12/1986    Quit date: 07/12/2020    Years since quitting: 3.5   Smokeless tobacco: Former    Types: Snuff, Chew  Vaping Use   Vaping status: Never Used  Substance and Sexual Activity   Alcohol use: Yes    Comment: RARE   Drug use: No   Sexual activity: Yes  Other Topics Concern   Not on file  Social History Narrative   Not on file   Social Drivers of Health   Financial Resource Strain: Medium Risk (09/09/2022)   Overall Financial Resource Strain (CARDIA)    Difficulty of Paying Living Expenses: Somewhat hard  Food Insecurity: No Food Insecurity (01/07/2024)   Hunger Vital Sign    Worried About Running Out of Food in the Last Year: Never true    Ran Out of Food in the Last Year: Never true  Transportation Needs: No Transportation Needs (01/07/2024)   PRAPARE - Doctor, general practice (Medical): No    Lack of Transportation (Non-Medical): No  Recent Concern: Transportation Needs - Unmet Transportation Needs (11/26/2023)   PRAPARE - Transportation    Lack of Transportation (Medical): Yes    Lack of Transportation (Non-Medical): Yes  Physical Activity: Not on file  Stress: No Stress Concern Present (10/10/2023)   Received from [facility] of Occupational Health - Occupational Stress Questionnaire    Feeling of Stress : Not at all  Social Connections: Moderately Isolated (01/07/2024)   Social Connection and Isolation Panel [NHANES]    Frequency of Communication with Friends and Family: More than three times a week    Frequency of Social Gatherings with Friends and Family: More than three times a week    Attends Religious Services: Never    Database administrator or Organizations: No    Attends Banker Meetings: Never    Marital Status: Married  Catering manager Violence: Not At Risk (01/07/2024)   Humiliation, Afraid, Rape, and Kick questionnaire    Fear of Current or Ex-Partner: No    Emotionally Abused: No    Physically Abused: No    Sexually Abused: No    Review of Systems:    Constitutional: No weight loss, fever, chills, weakness or fatigue HEENT: Eyes: No change in vision               Ears, Nose, Throat:  No change in hearing or congestion Skin: No rash or itching Cardiovascular: No chest pain, chest pressure or palpitations   Respiratory: No SOB or cough Gastrointestinal: See HPI and otherwise negative Genitourinary: No dysuria or change in urinary frequency Neurological: No headache, dizziness or syncope Musculoskeletal: No new muscle or joint pain Hematologic: No bleeding or bruising Psychiatric: No history of depression or anxiety    Physical Exam:  Vital signs: There were no vitals taken for this visit.  Constitutional: NAD, Well developed, Well nourished, alert and cooperative Head:   Normocephalic and atraumatic. Eyes:   PEERL, EOMI. No icterus. Conjunctiva pink. Respiratory: Respirations even and unlabored. Lungs clear to auscultation bilaterally.   No wheezes, crackles, or rhonchi.  Cardiovascular:  Regular rate and rhythm. No peripheral edema, cyanosis or pallor.  Gastrointestinal:  Soft, nondistended, nontender. No rebound or guarding. Normal bowel sounds. No appreciable masses or hepatomegaly. Rectal:  Not performed.  Msk:  Symmetrical without gross deformities. Without edema, no deformity or joint abnormality.  Neurologic:  Alert and  oriented x4;  grossly normal neurologically.  Skin:   Dry and intact without significant lesions or rashes. Psychiatric: Oriented to person, place and time. Demonstrates good judgement and reason without abnormal affect or behaviors.  Physical Exam           RELEVANT LABS AND IMAGING: CBC    Component Value Date/Time   WBC 15.9 (H) 01/16/2024 0713   RBC 3.89 (L) 01/16/2024 0713   HGB 10.2 (L) 01/16/2024 0713   HGB 11.1 (L) 06/15/2022 1140   HCT 32.6 (L) 01/16/2024 0713   HCT 35.5 (L) 06/15/2022 1140   PLT 406 (H) 01/16/2024 0713   PLT 371 06/15/2022 1140   MCV 83.8 01/16/2024  0713   MCV 75 (L) 06/15/2022 1140   MCH 26.2 01/16/2024 0713   MCHC 31.3 01/16/2024 0713   RDW 14.6 01/16/2024 0713   RDW 19.0 (H) 06/15/2022 1140   LYMPHSABS 1.1 01/09/2024 0607   LYMPHSABS 3.1 05/25/2019 1613   MONOABS 1.2 (H) 01/09/2024 0607   EOSABS 0.2 01/09/2024 0607   EOSABS 0.4 05/25/2019 1613   BASOSABS 0.0 01/09/2024 0607   BASOSABS 0.1 05/25/2019 1613    CMP     Component Value Date/Time   NA 138 01/16/2024 0713   NA 138 04/25/2023 1520   K 4.7 01/16/2024 0713   CL 108 01/16/2024 0713   CO2 22 01/16/2024 0713   GLUCOSE 278 (H) 01/16/2024 0713   BUN 51 (H) 01/16/2024 0713   BUN 40 (H) 04/25/2023 1520   CREATININE 1.78 (H) 01/16/2024 0713   CREATININE 1.21 09/29/2016 1134   CALCIUM 8.4 (L) 01/16/2024 0713   PROT 5.8 (L)  01/09/2024 0607   ALBUMIN 1.7 (L) 01/09/2024 0607   AST 17 01/09/2024 0607   ALT 21 01/09/2024 0607   ALKPHOS 153 (H) 01/09/2024 0607   BILITOT 0.5 01/09/2024 0607   GFRNONAA 46 (L) 01/16/2024 0713   GFRAA 72 11/20/2020 1253     Assessment/Plan:   Assessment and Plan                Joseph [name] [name] 01/23/2024, 10:49 AM  Cc: Cain Saupe, MD

## 2024-01-24 ENCOUNTER — Other Ambulatory Visit: Payer: Self-pay | Admitting: Physician Assistant

## 2024-01-27 ENCOUNTER — Other Ambulatory Visit: Payer: Self-pay | Admitting: Physician Assistant

## 2024-01-29 ENCOUNTER — Other Ambulatory Visit: Payer: Self-pay | Admitting: Physician Assistant

## 2024-01-30 ENCOUNTER — Other Ambulatory Visit (HOSPITAL_COMMUNITY): Payer: Self-pay

## 2024-01-30 ENCOUNTER — Other Ambulatory Visit: Payer: Self-pay

## 2024-01-30 ENCOUNTER — Ambulatory Visit (INDEPENDENT_AMBULATORY_CARE_PROVIDER_SITE_OTHER): Payer: 59 | Admitting: Orthopedic Surgery

## 2024-01-30 DIAGNOSIS — Z89512 Acquired absence of left leg below knee: Secondary | ICD-10-CM | POA: Diagnosis not present

## 2024-01-30 DIAGNOSIS — S88112A Complete traumatic amputation at level between knee and ankle, left lower leg, initial encounter: Secondary | ICD-10-CM

## 2024-01-30 MED ORDER — OXYCODONE HCL 15 MG PO TABS
15.0000 mg | ORAL_TABLET | ORAL | 0 refills | Status: DC | PRN
  Filled 2024-01-30: qty 150, 25d supply, fill #0

## 2024-02-02 ENCOUNTER — Other Ambulatory Visit: Payer: Self-pay | Admitting: Physician Assistant

## 2024-02-06 ENCOUNTER — Encounter: Payer: Self-pay | Admitting: Orthopedic Surgery

## 2024-02-06 ENCOUNTER — Other Ambulatory Visit: Payer: Self-pay | Admitting: Physician Assistant

## 2024-02-06 NOTE — Progress Notes (Signed)
Office Visit Note   Patient: Joseph [name]           Date of Birth: 1971-09-19           MRN: [id number] Visit Date: 01/30/2024              Requested by: Cain Saupe, MD [address], [address] [place],  [name] [address] PCP: Cain Saupe, MD  Chief Complaint  Patient presents with   Right Leg - Follow-up    HX bilateral BKA    Left Leg - Follow-up      HPI: Patient is seen for acute ulceration and drainage from the left residual limb.  Patient states he has been hospitalized for 9 days.  He is status post bilateral transtibial amputation.  Recent left residual limb cellulitis.  He states he is finished his antibiotics.  Complains of draining and weeping from the left lower extremity.  Assessment & Plan: Visit Diagnoses:  1. Below-knee amputation of left lower extremity, initial encounter [facility])     Plan: Patient has and bearing ulceration.  Patient is following up with bionic for possible adjustable socket.  Stop using the prosthetic leg on the left until the ulceration has resolved.  Follow-Up Instructions: Return in about 2 weeks (around 02/13/2024).   Ortho Exam  Patient is alert, oriented, no adenopathy, well-dressed, normal affect, normal respiratory effort. Examination of the left leg there is no cellulitis no tenderness to palpation there is clear weeping edema from the end of the residual limb with an bearing ulceration.  Continued dry dressing changes.  Imaging: No results found. No images are attached to the encounter.  Labs: Lab Results  Component Value Date   HGBA1C 8.1 (H) 01/07/2024   HGBA1C 8.0 (H) 11/27/2023   HGBA1C 7.4 (H) 04/26/2023   ESRSEDRATE 62 (H) 07/01/2022   ESRSEDRATE 45 (H) 11/18/2021   ESRSEDRATE 41 (H) 09/16/2017   CRP 12.0 (H) 07/01/2022   CRP 12.1 (H) 11/18/2021   CRP 8.9 (H) 09/16/2017   REPTSTATUS 01/13/2024 FINAL 01/08/2024   CULT  01/08/2024    NO GROWTH 5 DAYS Performed at [facility], [address]  [place], [name] [address]    LABORGA KLEBSIELLA OXYTOCA (A) 11/26/2023     Lab Results  Component Value Date   ALBUMIN 1.7 (L) 01/09/2024   ALBUMIN 1.6 (L) 01/08/2024   ALBUMIN 3.1 (L) 11/26/2023   PREALBUMIN 16.5 (L) 01/07/2022    Lab Results  Component Value Date   MG 1.7 01/08/2024   MG 1.9 12/03/2023   MG 2.0 12/02/2023   Lab Results  Component Value Date   VD25OH 9.40 (L) 01/07/2022    Lab Results  Component Value Date   PREALBUMIN 16.5 (L) 01/07/2022      Latest Ref Rng & Units 01/16/2024    7:13 AM 01/15/2024    7:29 AM 01/14/2024    7:45 AM  CBC EXTENDED  WBC 4.0 - 10.5 K/uL 15.9  17.2  14.5   RBC 4.22 - 5.81 MIL/uL 3.89  4.12  4.35   Hemoglobin 13.0 - 17.0 g/dL 44.0  10.2  72.5   HCT 39.0 - 52.0 % 32.6  34.0  36.7   Platelets 150 - 400 K/uL 406  404  407      There is no height or weight on file to calculate BMI.  Orders:  No orders of the defined types were placed in this encounter.  No orders of the defined types were  placed in this encounter.    Procedures: No procedures performed  Clinical Data: No additional findings.  ROS:  All other systems negative, except as noted in the HPI. Review of Systems  Objective: Vital Signs: There were no vitals taken for this visit.  Specialty Comments:  No specialty comments available.  PMFS History: Patient Active Problem List   Diagnosis Date Noted   Acute kidney failure (HCC) 01/09/2024   Acute urinary retention 01/08/2024   S/P bilateral BKA (below knee amputation) (HCC) 01/07/2024   Morbid obesity with BMI of 70 and over, adult (HCC) 11/28/2023   Aortic atherosclerosis (HCC) 11/28/2023   CKD (chronic kidney disease), stage III (HCC) 11/28/2023   Weakness 04/28/2023   Nephrosis 04/27/2023   OSA (obstructive sleep apnea) 04/26/2023   Hx of iron deficiency anemia 07/07/2022   Below-knee amputation of left lower extremity (HCC)    Bipolar disorder with depression (HCC)    Dyspnea 11/18/2021    Bicuspid aortic valve 09/14/2021   Aneurysm of ascending aorta (HCC) 09/14/2021   DM (diabetes mellitus) with peripheral vascular complication (HCC) 08/20/2021   Essential hypertension 07/17/2021   Cellulitis of left lower leg 04/11/2021   Heart failure with preserved ejection fraction (HCC) 07/22/2020   Acute renal failure superimposed on stage 3a chronic kidney disease (HCC) 07/18/2020   Leukocytosis 05/31/2019   Smoker 03/08/2019   Chronic back pain 02/16/2019   BMI 70 and over, adult (HCC) 02/16/2019   Peripheral nerve disease 02/16/2019   COPD (chronic obstructive pulmonary disease) (HCC) 01/22/2019   Anxiety and depression 03/26/2018   Recurrent chest pain 07/29/2016   Hyperglycemia    Insulin dependent type 2 diabetes mellitus (HCC) 04/29/2015   History of pulmonary embolus (PE) 04/27/2013   CAD (coronary artery disease) 09/10/2008   Past Medical History:  Diagnosis Date   Aneurysm of ascending aorta (HCC) 09/14/2021   Anxiety    Aortic atherosclerosis (HCC) 11/28/2023   Arthritis    "knees, shoulders, hips, ankles" (07/29/2016)   Asthma    Bicuspid aortic valve 09/14/2021   BMI 70 and over, adult (HCC) 02/16/2019   CAD (coronary artery disease)    a. 2017: s/p BMS to distal Cx; b. LHC 05/30/2019: 80% mid, distal RCA s/p DES, 30% narrowing of d LM, widely patent LAD w/ luminal irregularities, widely patent stent in dCX  w/ 90+% stenosis distal to stent beofre small trifurcating obtuse marginal (potentially area of restenosis)   Chronic bronchitis (HCC)    Chronic ulcer of great toe of right foot (HCC) 09/09/2017   Chronic ulcer of right great toe (HCC) 09/19/2017   COPD (chronic obstructive pulmonary disease) (HCC)    Coronary arteriosclerosis    Deep venous thrombosis (HCC) 02/16/2019   Depression    Diabetes mellitus (HCC)    DVT (deep venous thrombosis) (HCC)    GERD (gastroesophageal reflux disease)    Hyperlipidemia    Hypertension    Migraine    Morbid obesity  (HCC)    Morbid obesity with BMI of 70 and over, adult (HCC) 11/28/2023   Myocardial infarction (HCC) 1996   "light one"   Non-ST elevation (NSTEMI) myocardial infarction (HCC) 07/22/2020   PE (pulmonary embolism) 04/2013   On chronic Xarelto   Peripheral nerve disease    Pneumonia "several times"   Sleep apnea    Type II diabetes mellitus (HCC)     Family History  Problem Relation Age of Onset   Hypertension Brother    Hypertension Father  Diabetes Other    Hyperlipidemia Other     Past Surgical History:  Procedure Laterality Date   ABDOMINAL AORTOGRAM W/LOWER EXTREMITY N/A 10/10/2020   Procedure: ABDOMINAL AORTOGRAM W/LOWER EXTREMITY;  Surgeon: Sherren Kerns, MD;  Location: [facility];  Service: Cardiovascular;  Laterality: N/A;   AMPUTATION Right 09/21/2017   Procedure: RIGHT GREAT TOE AMPUTATION, POSSIBLE VAC;  Surgeon: Tarry Kos, MD;  Location: [facility] OR;  Service: Orthopedics;  Laterality: Right;   AMPUTATION Left 08/13/2020   Procedure: LEFT FOOT 4TH RAY AMPUTATION;  Surgeon: [name], MD;  Location: [facility] OR;  Service: Orthopedics;  Laterality: Left;   AMPUTATION Left 11/20/2021   Procedure: LEFT TRANSMETATARSAL AMPUTATION;  Surgeon: [name], MD;  Location: [facility] OR;  Service: Orthopedics;  Laterality: Left;   AMPUTATION Left 01/08/2022   Procedure: LEFT BELOW KNEE AMPUTATION;  Surgeon: [name], MD;  Location: [facility] OR;  Service: Orthopedics;  Laterality: Left;   AMPUTATION Right 07/07/2022   Procedure: RIGHT BELOW KNEE AMPUTATION;  Surgeon: [name], MD;  Location: [facility] OR;  Service: Orthopedics;  Laterality: Right;   AORTOGRAM Bilateral 03/13/2021   Procedure: ABDOMINAL AORTOGRAM WITH Left LOWER EXTREMITY RUNOFF;  Surgeon: Leonie Douglas, MD;  Location: [facility] OR;  Service: Vascular;  Laterality: Bilateral;   APPLICATION OF WOUND VAC  01/08/2022   Procedure: APPLICATION OF WOUND VAC;  Surgeon: [name], MD;  Location: [facility] OR;  Service:  Orthopedics;;   APPLICATION OF WOUND VAC Right 07/07/2022   Procedure: APPLICATION OF WOUND VAC;  Surgeon: [name], MD;  Location: [facility] OR;  Service: Orthopedics;  Laterality: Right;   CARDIAC CATHETERIZATION  2006   CARDIAC CATHETERIZATION  1996   "@ [facility]; when I had my heart attack"   CARDIAC CATHETERIZATION N/A 07/29/2016   Procedure: Left Heart Cath and Coronary Angiography;  Surgeon: Runell Gess, MD;  Location: [facility] INVASIVE CV LAB;  Service: Cardiovascular;  Laterality: N/A;   CARDIAC CATHETERIZATION N/A 07/29/2016   Procedure: Coronary Stent Intervention;  Surgeon: Runell Gess, MD;  Location: [facility];  Service: Cardiovascular;  Laterality: N/A;   CARPAL TUNNEL RELEASE Bilateral    CORONARY ANGIOPLASTY WITH STENT PLACEMENT  07/29/2016   CORONARY ARTERY BYPASS GRAFT N/A 07/24/2020   Procedure: CORONARY ARTERY BYPASS GRAFTING (CABG), ON PUMP, TIMES FOUR, USING LEFT INTERNAL MAMMARY ARTERY AND ENDOSCOPICALLY HARVESTED RIGHT GREATER SAPHENOUS VEIN;  Surgeon: Corliss Skains, MD;  Location: [facility] OR;  Service: Open Heart Surgery;  Laterality: N/A;  FLOW TAC   CORONARY PRESSURE/FFR STUDY N/A 07/22/2020   Procedure: INTRAVASCULAR PRESSURE WIRE/FFR STUDY;  Surgeon: Marykay Lex, MD;  Location: [facility] INVASIVE CV LAB;  Service: Cardiovascular;  Laterality: N/A;   CORONARY STENT INTERVENTION N/A 05/30/2019   Procedure: CORONARY STENT INTERVENTION;  Surgeon: Lyn Records, MD;  Location: [facility];  Service: Cardiovascular;  Laterality: N/A;   CORONARY STENT INTERVENTION N/A 11/02/2019   Procedure: CORONARY STENT INTERVENTION;  Surgeon: Iran Ouch, MD;  Location: [facility];  Service: Cardiovascular;  Laterality: N/A;   ESOPHAGOGASTRODUODENOSCOPY N/A 09/22/2017   Procedure: ESOPHAGOGASTRODUODENOSCOPY (EGD);  Surgeon: Meryl Dare, MD;  Location: [facility] ENDOSCOPY;  Service: Endoscopy;  Laterality: N/A;   KNEE ARTHROSCOPY Bilateral    "2 on left; 1 on the  right"   LEFT HEART CATH AND CORONARY ANGIOGRAPHY N/A 11/02/2019   Procedure: LEFT HEART CATH AND CORONARY ANGIOGRAPHY;  Surgeon: Iran Ouch, MD;  Location: [facility];  Service: Cardiovascular;  Laterality: N/A;   LEFT HEART CATH AND CORONARY ANGIOGRAPHY N/A 07/22/2020   Procedure: LEFT HEART CATH AND CORONARY ANGIOGRAPHY;  Surgeon: Marykay Lex, MD;  Location: [facility] INVASIVE CV LAB;  Service: Cardiovascular;  Laterality: N/A;   LEFT HEART CATH AND CORS/GRAFTS ANGIOGRAPHY N/A 08/17/2021   Procedure: LEFT HEART CATH AND CORS/GRAFTS ANGIOGRAPHY;  Surgeon: Yvonne Kendall, MD;  Location: [facility];  Service: Cardiovascular;  Laterality: N/A;   PERIPHERAL VASCULAR INTERVENTION Left 10/11/2020   popliteal and SFA stent placement    PERIPHERAL VASCULAR INTERVENTION Left 10/10/2020   Procedure: PERIPHERAL VASCULAR INTERVENTION;  Surgeon: Sherren Kerns, MD;  Location: [facility];  Service: Cardiovascular;  Laterality: Left;   RIGHT/LEFT HEART CATH AND CORONARY ANGIOGRAPHY N/A 05/30/2019   Procedure: RIGHT/LEFT HEART CATH AND CORONARY ANGIOGRAPHY;  Surgeon: Lyn Records, MD;  Location: [facility];  Service: Cardiovascular;  Laterality: N/A;   RIGHT/LEFT HEART CATH AND CORONARY/GRAFT ANGIOGRAPHY N/A 06/17/2022   Procedure: RIGHT/LEFT HEART CATH AND CORONARY/GRAFT ANGIOGRAPHY;  Surgeon: Lennette Bihari, MD;  Location: [facility];  Service: Cardiovascular;  Laterality: N/A;   SHOULDER OPEN ROTATOR CUFF REPAIR Bilateral    TEE WITHOUT CARDIOVERSION N/A 07/24/2020   Procedure: TRANSESOPHAGEAL ECHOCARDIOGRAM (TEE);  Surgeon: Corliss Skains, MD;  Location: [facility] OR;  Service: Open Heart Surgery;  Laterality: N/A;   Social History   Occupational History   Occupation: disabled     Associate Professor: UNEMPLOYED  Tobacco Use   Smoking status: Former    Current packs/day: 0.00    Average packs/day: 1 pack/day for 34.0 years (34.0 ttl pk-yrs)    Types: Cigarettes     Start date: 07/12/1986    Quit date: 07/12/2020    Years since quitting: 3.5   Smokeless tobacco: Former    Types: Snuff, Chew  Vaping Use   Vaping status: Never Used  Substance and Sexual Activity   Alcohol use: Yes    Comment: RARE   Drug use: No   Sexual activity: Yes

## 2024-02-13 ENCOUNTER — Ambulatory Visit (INDEPENDENT_AMBULATORY_CARE_PROVIDER_SITE_OTHER): Payer: 59 | Admitting: Orthopedic Surgery

## 2024-02-13 DIAGNOSIS — L89899 Pressure ulcer of other site, unspecified stage: Secondary | ICD-10-CM

## 2024-02-13 DIAGNOSIS — Z89511 Acquired absence of right leg below knee: Secondary | ICD-10-CM | POA: Diagnosis not present

## 2024-02-13 DIAGNOSIS — Z89512 Acquired absence of left leg below knee: Secondary | ICD-10-CM

## 2024-02-13 DIAGNOSIS — S88111A Complete traumatic amputation at level between knee and ankle, right lower leg, initial encounter: Secondary | ICD-10-CM

## 2024-02-13 DIAGNOSIS — S88112A Complete traumatic amputation at level between knee and ankle, left lower leg, initial encounter: Secondary | ICD-10-CM

## 2024-02-14 ENCOUNTER — Encounter: Payer: Self-pay | Admitting: Orthopedic Surgery

## 2024-02-14 NOTE — Progress Notes (Signed)
 Office Visit Note   Patient: Joseph [name]           Date of Birth: [birth year]           MRN: [id number] Visit Date: 02/13/2024              Requested by: Cain Saupe, MD [address] [place],  [name] [address] PCP: Cain Saupe, MD  Chief Complaint  Patient presents with   Left Leg - Follow-up      HPI: [name] who is status post bilateral transtibial amputations.  Patient is currently following up with bionics for new liners and sockets for both lower extremities.  Patient has had an acute pressure injury to the left residual limb with cellulitis and ulceration.  Patient has completed his course of antibiotics.  Assessment & Plan: Visit Diagnoses:  1. Below-knee amputation of left lower extremity, initial encounter (HCC)   2. Pressure injury of skin of left lower leg   3. Below-knee amputation of right lower extremity, initial encounter [facility])     Plan: Patient will follow-up with biotics for prosthetic fitting.  Follow-Up Instructions: Return in about 4 weeks (around 03/12/2024).   Ortho Exam  Patient is alert, oriented, no adenopathy, well-dressed, normal affect, normal respiratory effort. Examination of both transtibial amputations shows a stable amputation on the right without cellulitis without induration without ulceration.  Examination of the left residual limb there is some induration posteriorly with a small amount of clear serous drainage there is no cellulitis no open wounds.  Imaging: No results found. No images are attached to the encounter.  Labs: Lab Results  Component Value Date   HGBA1C 8.1 (H) 01/07/2024   HGBA1C 8.0 (H) 11/27/2023   HGBA1C 7.4 (H) 04/26/2023   ESRSEDRATE 62 (H) 07/01/2022   ESRSEDRATE 45 (H) 11/18/2021   ESRSEDRATE 41 (H) 09/16/2017   CRP 12.0 (H) 07/01/2022   CRP 12.1 (H) 11/18/2021   CRP 8.9 (H) 09/16/2017   REPTSTATUS 01/13/2024 FINAL 01/08/2024   CULT  01/08/2024    NO GROWTH  5 DAYS Performed at [facility], [address], [place], [name] [address]    LABORGA KLEBSIELLA OXYTOCA (A) 11/26/2023     Lab Results  Component Value Date   ALBUMIN 1.7 (L) 01/09/2024   ALBUMIN 1.6 (L) 01/08/2024   ALBUMIN 3.1 (L) 11/26/2023   PREALBUMIN 16.5 (L) 01/07/2022    Lab Results  Component Value Date   MG 1.7 01/08/2024   MG 1.9 12/03/2023   MG 2.0 12/02/2023   Lab Results  Component Value Date   VD25OH 9.40 (L) 01/07/2022    Lab Results  Component Value Date   PREALBUMIN 16.5 (L) 01/07/2022      Latest Ref Rng & Units 01/16/2024    7:13 AM 01/15/2024    7:29 AM 01/14/2024    7:45 AM  CBC EXTENDED  WBC 4.0 - 10.5 K/uL 15.9  17.2  14.5   RBC 4.22 - 5.81 MIL/uL 3.89  4.12  4.35   Hemoglobin 13.0 - 17.0 g/dL 95.2  84.1  32.4   HCT 39.0 - 52.0 % 32.6  34.0  36.7   Platelets 150 - 400 K/uL 406  404  407      There is no height or weight on file to calculate BMI.  Orders:  No orders of the defined types were placed in this encounter.  No orders of the defined types were placed in this  encounter.    Procedures: No procedures performed  Clinical Data: No additional findings.  ROS:  All other systems negative, except as noted in the HPI. Review of Systems  Objective: Vital Signs: There were no vitals taken for this visit.  Specialty Comments:  No specialty comments available.  PMFS History: Patient Active Problem List   Diagnosis Date Noted   Acute kidney failure (HCC) 01/09/2024   Acute urinary retention 01/08/2024   S/P bilateral BKA (below knee amputation) (HCC) 01/07/2024   Morbid obesity with BMI of 70 and over, adult (HCC) 11/28/2023   Aortic atherosclerosis (HCC) 11/28/2023   CKD (chronic kidney disease), stage III (HCC) 11/28/2023   Weakness 04/28/2023   Nephrosis 04/27/2023   OSA (obstructive sleep apnea) 04/26/2023   Hx of iron deficiency anemia 07/07/2022   Below-knee amputation of left lower extremity (HCC)     Bipolar disorder with depression (HCC)    Dyspnea 11/18/2021   Bicuspid aortic valve 09/14/2021   Aneurysm of ascending aorta (HCC) 09/14/2021   DM (diabetes mellitus) with peripheral vascular complication (HCC) 08/20/2021   Essential hypertension 07/17/2021   Cellulitis of left lower leg 04/11/2021   Heart failure with preserved ejection fraction (HCC) 07/22/2020   Acute renal failure superimposed on stage 3a chronic kidney disease (HCC) 07/18/2020   Leukocytosis 05/31/2019   Smoker 03/08/2019   Chronic back pain 02/16/2019   BMI 70 and over, adult (HCC) 02/16/2019   Peripheral nerve disease 02/16/2019   COPD (chronic obstructive pulmonary disease) (HCC) 01/22/2019   Anxiety and depression 03/26/2018   Recurrent chest pain 07/29/2016   Hyperglycemia    Insulin dependent type 2 diabetes mellitus (HCC) 04/29/2015   History of pulmonary embolus (PE) 04/27/2013   CAD (coronary artery disease) 09/10/2008   Past Medical History:  Diagnosis Date   Aneurysm of ascending aorta (HCC) 09/14/2021   Anxiety    Aortic atherosclerosis (HCC) 11/28/2023   Arthritis    "knees, shoulders, hips, ankles" (07/29/2016)   Asthma    Bicuspid aortic valve 09/14/2021   BMI 70 and over, adult (HCC) 02/16/2019   CAD (coronary artery disease)    a. 2017: s/p BMS to distal Cx; b. LHC 05/30/2019: 80% mid, distal RCA s/p DES, 30% narrowing of d LM, widely patent LAD w/ luminal irregularities, widely patent stent in dCX  w/ 90+% stenosis distal to stent beofre small trifurcating obtuse marginal (potentially area of restenosis)   Chronic bronchitis (HCC)    Chronic ulcer of great toe of right foot (HCC) 09/09/2017   Chronic ulcer of right great toe (HCC) 09/19/2017   COPD (chronic obstructive pulmonary disease) (HCC)    Coronary arteriosclerosis    Deep venous thrombosis (HCC) 02/16/2019   Depression    Diabetes mellitus (HCC)    DVT (deep venous thrombosis) (HCC)    GERD (gastroesophageal reflux disease)     Hyperlipidemia    Hypertension    Migraine    Morbid obesity (HCC)    Morbid obesity with BMI of 70 and over, adult (HCC) 11/28/2023   Myocardial infarction (HCC) 1996   "light one"   Non-ST elevation (NSTEMI) myocardial infarction (HCC) 07/22/2020   PE (pulmonary embolism) 04/2013   On chronic Xarelto   Peripheral nerve disease    Pneumonia "several times"   Sleep apnea    Type II diabetes mellitus (HCC)     Family History  Problem Relation Age of Onset   Hypertension Brother    Hypertension Father    Diabetes Other  Hyperlipidemia Other     Past Surgical History:  Procedure Laterality Date   ABDOMINAL AORTOGRAM W/LOWER EXTREMITY N/A 10/10/2020   Procedure: ABDOMINAL AORTOGRAM W/LOWER EXTREMITY;  Surgeon: Sherren Kerns, MD;  [facility]: [facility];  Service: Cardiovascular;  Laterality: N/A;   AMPUTATION Right 09/21/2017   Procedure: RIGHT GREAT TOE AMPUTATION, POSSIBLE VAC;  Surgeon: Tarry Kos, MD;  Location: [facility] OR;  Service: Orthopedics;  Laterality: Right;   AMPUTATION Left 08/13/2020   Procedure: LEFT FOOT 4TH RAY AMPUTATION;  Surgeon: [name], MD;  Location: [facility] OR;  Service: Orthopedics;  Laterality: Left;   AMPUTATION Left 11/20/2021   Procedure: LEFT TRANSMETATARSAL AMPUTATION;  Surgeon: [name], MD;  Location: [facility] OR;  Service: Orthopedics;  Laterality: Left;   AMPUTATION Left 01/08/2022   Procedure: LEFT BELOW KNEE AMPUTATION;  Surgeon: [name], MD;  Location: [facility] OR;  Service: Orthopedics;  Laterality: Left;   AMPUTATION Right 07/07/2022   Procedure: RIGHT BELOW KNEE AMPUTATION;  Surgeon: [name], MD;  Location: [facility] OR;  Service: Orthopedics;  Laterality: Right;   AORTOGRAM Bilateral 03/13/2021   Procedure: ABDOMINAL AORTOGRAM WITH Left LOWER EXTREMITY RUNOFF;  Surgeon: Leonie Douglas, MD;  Location: [facility] OR;  Service: Vascular;  Laterality: Bilateral;   APPLICATION OF WOUND VAC  01/08/2022   Procedure: APPLICATION OF WOUND VAC;   Surgeon: [name], MD;  Location: [facility] OR;  Service: Orthopedics;;   APPLICATION OF WOUND VAC Right 07/07/2022   Procedure: APPLICATION OF WOUND VAC;  Surgeon: [name], MD;  Location: [facility] OR;  Service: Orthopedics;  Laterality: Right;   CARDIAC CATHETERIZATION  2006   CARDIAC CATHETERIZATION  1996   "@ [facility]; when I had my heart attack"   CARDIAC CATHETERIZATION N/A 07/29/2016   Procedure: Left Heart Cath and Coronary Angiography;  Surgeon: Runell Gess, MD;  Location: [facility] INVASIVE CV LAB;  Service: Cardiovascular;  Laterality: N/A;   CARDIAC CATHETERIZATION N/A 07/29/2016   Procedure: Coronary Stent Intervention;  Surgeon: Runell Gess, MD;  [facility]: [facility];  Service: Cardiovascular;  Laterality: N/A;   CARPAL TUNNEL RELEASE Bilateral    CORONARY ANGIOPLASTY WITH STENT PLACEMENT  07/29/2016   CORONARY ARTERY BYPASS GRAFT N/A 07/24/2020   Procedure: CORONARY ARTERY BYPASS GRAFTING (CABG), ON PUMP, TIMES FOUR, USING LEFT INTERNAL MAMMARY ARTERY AND ENDOSCOPICALLY HARVESTED RIGHT GREATER SAPHENOUS VEIN;  Surgeon: Corliss Skains, MD;  Location: [facility] OR;  Service: Open Heart Surgery;  Laterality: N/A;  FLOW TAC   CORONARY PRESSURE/FFR STUDY N/A 07/22/2020   Procedure: INTRAVASCULAR PRESSURE WIRE/FFR STUDY;  Surgeon: Marykay Lex, MD;  Location: [facility] INVASIVE CV LAB;  Service: Cardiovascular;  Laterality: N/A;   CORONARY STENT INTERVENTION N/A 05/30/2019   Procedure: CORONARY STENT INTERVENTION;  Surgeon: Lyn Records, MD;  [facility]: [facility];  Service: Cardiovascular;  Laterality: N/A;   CORONARY STENT INTERVENTION N/A 11/02/2019   Procedure: CORONARY STENT INTERVENTION;  Surgeon: Iran Ouch, MD;  [facility]: [facility];  Service: Cardiovascular;  Laterality: N/A;   ESOPHAGOGASTRODUODENOSCOPY N/A 09/22/2017   Procedure: ESOPHAGOGASTRODUODENOSCOPY (EGD);  Surgeon: Meryl Dare, MD;  Location: [facility] ENDOSCOPY;  Service: Endoscopy;  Laterality: N/A;    KNEE ARTHROSCOPY Bilateral    "2 on left; 1 on the right"   LEFT HEART CATH AND CORONARY ANGIOGRAPHY N/A 11/02/2019   Procedure: LEFT HEART CATH AND CORONARY ANGIOGRAPHY;  Surgeon: Iran Ouch, MD;  [facility];  Service: Cardiovascular;  Laterality: N/A;   LEFT HEART CATH AND CORONARY ANGIOGRAPHY N/A 07/22/2020   Procedure: LEFT HEART CATH AND CORONARY ANGIOGRAPHY;  Surgeon: Marykay Lex, MD;  Location: [facility] INVASIVE CV LAB;  Service: Cardiovascular;  Laterality: N/A;   LEFT HEART CATH AND CORS/GRAFTS ANGIOGRAPHY N/A 08/17/2021   Procedure: LEFT HEART CATH AND CORS/GRAFTS ANGIOGRAPHY;  Surgeon: Yvonne Kendall, MD;  [facility];  Service: Cardiovascular;  Laterality: N/A;   PERIPHERAL VASCULAR INTERVENTION Left 10/11/2020   popliteal and SFA stent placement    PERIPHERAL VASCULAR INTERVENTION Left 10/10/2020   Procedure: PERIPHERAL VASCULAR INTERVENTION;  Surgeon: Sherren Kerns, MD;  [facility];  Service: Cardiovascular;  Laterality: Left;   RIGHT/LEFT HEART CATH AND CORONARY ANGIOGRAPHY N/A 05/30/2019   Procedure: RIGHT/LEFT HEART CATH AND CORONARY ANGIOGRAPHY;  Surgeon: Lyn Records, MD;  [facility];  Service: Cardiovascular;  Laterality: N/A;   RIGHT/LEFT HEART CATH AND CORONARY/GRAFT ANGIOGRAPHY N/A 06/17/2022   Procedure: RIGHT/LEFT HEART CATH AND CORONARY/GRAFT ANGIOGRAPHY;  Surgeon: Lennette Bihari, MD;  [facility]: [facility];  Service: Cardiovascular;  Laterality: N/A;   SHOULDER OPEN ROTATOR CUFF REPAIR Bilateral    TEE WITHOUT CARDIOVERSION N/A 07/24/2020   Procedure: TRANSESOPHAGEAL ECHOCARDIOGRAM (TEE);  Surgeon: Corliss Skains, MD;  Location: [facility] OR;  Service: Open Heart Surgery;  Laterality: N/A;   Social History   Occupational History   Occupation: disabled     Associate Professor: UNEMPLOYED  Tobacco Use   Smoking status: Former    Current packs/day: 0.00    Average packs/day: 1 pack/day for  34.0 years (34.0 ttl pk-yrs)    Types: Cigarettes    Start date: 07/12/1986    Quit date: 07/12/2020    Years since quitting: 3.5   Smokeless tobacco: Former    Types: Snuff, Chew  Vaping Use   Vaping status: Never Used  Substance and Sexual Activity   Alcohol use: Yes    Comment: RARE   Drug use: No   Sexual activity: Yes

## 2024-02-23 ENCOUNTER — Other Ambulatory Visit (HOSPITAL_COMMUNITY): Payer: Self-pay

## 2024-02-23 ENCOUNTER — Other Ambulatory Visit: Payer: Self-pay | Admitting: Physician Assistant

## 2024-02-23 MED ORDER — OXYCODONE HCL 15 MG PO TABS
15.0000 mg | ORAL_TABLET | ORAL | 0 refills | Status: DC
Start: 2024-02-23 — End: 2024-04-27

## 2024-02-23 MED ORDER — OXYCODONE HCL 15 MG PO TABS
15.0000 mg | ORAL_TABLET | ORAL | 0 refills | Status: DC | PRN
  Filled 2024-02-27: qty 150, 30d supply, fill #0

## 2024-02-27 ENCOUNTER — Other Ambulatory Visit (HOSPITAL_COMMUNITY): Payer: Self-pay

## 2024-02-27 ENCOUNTER — Telehealth: Payer: Self-pay | Admitting: Nurse Practitioner

## 2024-02-27 NOTE — Telephone Encounter (Signed)
  Pt c/o BP issue: STAT if pt c/o blurred vision, one-sided weakness or slurred speech.  STAT if BP is GREATER than 180/120 TODAY.  STAT if BP is LESS than 90/60 and SYMPTOMATIC TODAY  1. What is your BP concern? Pt called in stating he saw his PCP Friday and his heart murmur was getting worse and BP has been running high.  2. Have you taken any BP medication today? No   3. What are your last 5 BP readings?   Yesterday: 176/108, 158/103, 153/91. Has not taken today.   4. Are you having any other symptoms (ex. Dizziness, headache, blurred vision, passed out)?  Pt states he always has blurred vision due to needing eye surgery and he is always dizzy because he has vertigo.

## 2024-02-27 NOTE — Telephone Encounter (Signed)
 Spoke with patient and he states his PCP informed him he needs to see his cardiologist because his heart murmur is getting worse. He has also been experiencing elevated BP and headache. He stated his dizziness comes from  his vertigo and blurred vision because he needs to have eye surgery. He had chest pain last night and had to take two nitroglycerin. He states he is not going to the ED.  I scheduled earliest appointment with APP. BP this morning 181/102 hr 91 176/105 hr 91

## 2024-03-02 ENCOUNTER — Other Ambulatory Visit (HOSPITAL_COMMUNITY): Payer: Self-pay

## 2024-03-04 NOTE — Progress Notes (Deleted)
 Cardiology Office Note    Patient Name: Joseph [name] Date of Encounter: 03/04/2024  Primary Care Provider:  Cain Saupe, MD Primary Cardiologist:  Donato Schultz, MD Primary Electrophysiologist: None   Past Medical History    Past Medical History:  Diagnosis Date   Aneurysm of ascending aorta (HCC) 09/14/2021   Anxiety    Aortic atherosclerosis (HCC) 11/28/2023   Arthritis    "knees, shoulders, hips, ankles" (07/29/2016)   Asthma    Bicuspid aortic valve 09/14/2021   BMI 70 and over, adult (HCC) 02/16/2019   CAD (coronary artery disease)    a. 2017: s/p BMS to distal Cx; b. LHC 05/30/2019: 80% mid, distal RCA s/p DES, 30% narrowing of d LM, widely patent LAD w/ luminal irregularities, widely patent stent in dCX  w/ 90+% stenosis distal to stent beofre small trifurcating obtuse marginal (potentially area of restenosis)   Chronic bronchitis (HCC)    Chronic ulcer of great toe of right foot (HCC) 09/09/2017   Chronic ulcer of right great toe (HCC) 09/19/2017   COPD (chronic obstructive pulmonary disease) (HCC)    Coronary arteriosclerosis    Deep venous thrombosis (HCC) 02/16/2019   Depression    Diabetes mellitus (HCC)    DVT (deep venous thrombosis) (HCC)    GERD (gastroesophageal reflux disease)    Hyperlipidemia    Hypertension    Migraine    Morbid obesity (HCC)    Morbid obesity with BMI of 70 and over, adult (HCC) 11/28/2023   Myocardial infarction (HCC) 1996   "light one"   Non-ST elevation (NSTEMI) myocardial infarction (HCC) 07/22/2020   PE (pulmonary embolism) 04/2013   On chronic Xarelto   Peripheral nerve disease    Pneumonia "several times"   Sleep apnea    Type II diabetes mellitus (HCC)     History of Present Illness  Joseph [name] is a 53 y.o. male with PMH of hx of CAD s/p dLCx and s/p CABG in 2021(LIMA-->LAD, SVG-->PDA, OM1, D1), PAD with stents to the R extremity with prior R and L toe amputations, chronic ulcer of both feet great toe, COPD with  significant prior smoking history, HTN, HLD, ischemic cardiomyopathy, DM2 with diabetes neuropathy, prior hx of morbid Obesity, OSA unclear if on CPAP, hx of DVT and PE on Xarelto who presents today for hospital follow-up.   Joseph [name] has a PMH of PE with CAD with BMS to distal circumflex and CABG completed in 07/2020 with (LIMA-->LAD, SVG-->PDA, OM1, D1).  He had 2 stents placed to popliteal and left SFA with partial amputation of his foot.  He underwent left below-knee amputation in 12/2021. He was admitted 05/01/2023 for exacerbation of CHF. He was given Lasix with his significant edema was seen by nephrology for possible nephrotic syndrome. Creatinine was 2.54 and patient had complaint of chest pain with reassuring troponins completed. He was discharged with plan for diuresis and nephrology follow-up.  He was seen by Tereso Newcomer, PA on 04/25/2023 he was scheduled for cardiac follow-up on 06/07/2023 however was a no-show.  He was admitted on 10/01/2023 after suffering a fall and admitted on 11/26/2023 with lower abdominal pain and hematuria.  He was admitted and treated for emphysematous cystitis with 14-day course of antibiotics and Foley catheter in place with urology follow-up.  Hospitalization was prolonged due to hyperkalemia and was treated with [facility].     He was readmitted on 01/07/2024 with complaint of chest pain he endorsed with and without exertion.  He also endorsed  shortness of breath and fatigue.  Chest x-ray was completed and did not show any acute findings and WBCs were elevated at 18.6.  He was started on ceftriaxone for cellulitis of the left BKA stump and had Foley catheter placed due to urinary retention.  Creatinine was 3 on arrival with reduction to 1.2 prior to discharge.  He was advised to follow-up with [facility] and was given [facility] for potassium of 5.3.  Blood pressure was found to be stable and diuretics were held in the setting of euvolemia and elevated creatinine.  During  today's visit the patient reports*** .  Patient denies chest pain, palpitations, dyspnea, PND, orthopnea, nausea, vomiting, dizziness, syncope, edema, weight gain, or early satiety.  ***Notes: Patient was seen by PCP and noted to have worsening heart murmur with worsening BP.  He has also had complaints of dizziness and chest pain that required nitroglycerin x 2. -Last ischemic evaluation: -Last echo: -Interim ED visits: Review of Systems  Please see the history of present illness.    All other systems reviewed and are otherwise negative except as noted above.  Physical Exam    Wt Readings from Last 3 Encounters:  01/07/24 (!) 382 lb 8 oz (173.5 kg)  11/27/23 (!) 382 lb 8 oz (173.5 kg)  10/01/23 (!) 320 lb (145.2 kg)   WU:[id number] were no vitals filed for this visit.,There is no height or weight on file to calculate BMI. GEN: Well nourished, well developed in no acute distress Neck: No JVD; No carotid bruits Pulmonary: Clear to auscultation without rales, wheezing or rhonchi  Cardiovascular: Normal rate. Regular rhythm. Normal S1. Normal S2.   Murmurs: There is no murmur.  ABDOMEN: Soft, non-tender, non-distended EXTREMITIES:  No edema; No deformity   EKG/LABS/ Recent Cardiac Studies   ECG personally reviewed by me today - ***  Risk Assessment/Calculations:   {Does this patient have ATRIAL FIBRILLATION?:[id number]}      Lab Results  Component Value Date   WBC 15.9 (H) 01/16/2024   HGB 10.2 (L) 01/16/2024   HCT 32.6 (L) 01/16/2024   MCV 83.8 01/16/2024   PLT 406 (H) 01/16/2024   Lab Results  Component Value Date   CREATININE 1.78 (H) 01/16/2024   BUN 51 (H) 01/16/2024   NA 138 01/16/2024   K 4.7 01/16/2024   CL 108 01/16/2024   CO2 22 01/16/2024   Lab Results  Component Value Date   CHOL 136 05/02/2023   HDL 40 (L) 05/02/2023   LDLCALC 78 05/02/2023   LDLDIRECT 120.0 05/28/2015   TRIG 89 05/02/2023   CHOLHDL 3.4 05/02/2023    Lab Results  Component Value Date    HGBA1C 8.1 (H) 01/07/2024   Assessment & Plan    1.HFpEF: -Patient's last 2D echo was completed with EF of 55-60, no RWMA, moderate LVH, GRII DD, normal RVSF, trivial MR,   2.Coronary artery disease: -s/p CABG in 2021 with most recent right and left heart catheterization completed with clear grafts and normal RV pressure.   3.Bicuspid aortic valve: -Patient had diastolic murmur auscultated on examination today.   4.Nephrotic syndrome:   5.  Bilateral BKA:      Disposition: Follow-up with Donato Schultz, MD or APP in *** months {Are you ordering a CV Procedure (e.g. stress test, cath, DCCV, TEE, etc)?   Press F2        :[id number]}   Signed, Napoleon Form, Leodis Rains, NP 03/04/2024, 2:16 PM [facility]

## 2024-03-05 ENCOUNTER — Ambulatory Visit: Admitting: Nurse Practitioner

## 2024-03-05 ENCOUNTER — Other Ambulatory Visit (HOSPITAL_COMMUNITY): Payer: Self-pay

## 2024-03-05 DIAGNOSIS — I5032 Chronic diastolic (congestive) heart failure: Secondary | ICD-10-CM

## 2024-03-05 DIAGNOSIS — I25119 Atherosclerotic heart disease of native coronary artery with unspecified angina pectoris: Secondary | ICD-10-CM

## 2024-03-05 DIAGNOSIS — S88112D Complete traumatic amputation at level between knee and ankle, left lower leg, subsequent encounter: Secondary | ICD-10-CM

## 2024-03-05 DIAGNOSIS — N049 Nephrotic syndrome with unspecified morphologic changes: Secondary | ICD-10-CM

## 2024-03-05 DIAGNOSIS — Q2381 Bicuspid aortic valve: Secondary | ICD-10-CM

## 2024-03-06 ENCOUNTER — Other Ambulatory Visit: Payer: Self-pay | Admitting: Cardiovascular Disease

## 2024-03-12 ENCOUNTER — Ambulatory Visit (INDEPENDENT_AMBULATORY_CARE_PROVIDER_SITE_OTHER): Payer: 59 | Admitting: Orthopedic Surgery

## 2024-03-12 DIAGNOSIS — L89899 Pressure ulcer of other site, unspecified stage: Secondary | ICD-10-CM | POA: Diagnosis not present

## 2024-03-12 DIAGNOSIS — Z89512 Acquired absence of left leg below knee: Secondary | ICD-10-CM | POA: Diagnosis not present

## 2024-03-12 DIAGNOSIS — S88111A Complete traumatic amputation at level between knee and ankle, right lower leg, initial encounter: Secondary | ICD-10-CM

## 2024-03-12 DIAGNOSIS — S88112A Complete traumatic amputation at level between knee and ankle, left lower leg, initial encounter: Secondary | ICD-10-CM

## 2024-03-12 DIAGNOSIS — Z89511 Acquired absence of right leg below knee: Secondary | ICD-10-CM

## 2024-03-12 MED ORDER — DOXYCYCLINE HYCLATE 100 MG PO TABS
100.0000 mg | ORAL_TABLET | Freq: Two times a day (BID) | ORAL | 0 refills | Status: DC
Start: 2024-03-12 — End: 2024-03-23

## 2024-03-13 ENCOUNTER — Ambulatory Visit: Admitting: Nurse Practitioner

## 2024-03-18 ENCOUNTER — Encounter: Payer: Self-pay | Admitting: Orthopedic Surgery

## 2024-03-18 NOTE — Progress Notes (Signed)
 Office Visit Note   Patient: Joseph [name]           Date of Birth: 14-Aug-1971           MRN: [id number] Visit Date: 03/12/2024              Requested by: Cain Saupe, MD [address], [address] [place],  [name] [address] PCP: Cain Saupe, MD  Chief Complaint  Patient presents with   Right Leg - Follow-up   Left Leg - Follow-up      HPI: [name] who is seen for cellulitis of the left below-knee amputation.  Patient is status post amputation in January 2023 also status post right transtibial amputation July 2023.  Patient states he is swelling all over his body with redness over the residual limb left below-knee amputation.  Patient is currently on Lasix 30 mg 3 times a day.  Assessment & Plan: Visit Diagnoses:  1. Below-knee amputation of left lower extremity, initial encounter (HCC)   2. Pressure injury of skin of left lower leg   3. Below-knee amputation of right lower extremity, initial encounter [facility])     Plan: Patient is prescribed doxycycline.  He will proceed with Dial soap cleansing compression and hold on the use of the prosthesis.  Follow-Up Instructions: Return in about 2 weeks (around 03/26/2024).   Ortho Exam  Patient is alert, oriented, no adenopathy, well-dressed, normal affect, normal respiratory effort. Examination patient's blood pressure is 120/92 heart rate 96 O2 sats 94% on room air.  He is afebrile he states he has shortness of breath.  Patient has cellulitis of the residual limb on the left without tenderness to palpation.  There is a small wound that is 3 mm in diameter with bloody drainage in the wound is probed with a Q-tip and is 2 cm deep.  Most recent hemoglobin A1c 8.1.  Imaging: No results found. No images are attached to the encounter.  Labs: Lab Results  Component Value Date   HGBA1C 8.1 (H) 01/07/2024   HGBA1C 8.0 (H) 11/27/2023   HGBA1C 7.4 (H) 04/26/2023   ESRSEDRATE 62 (H) 07/01/2022    ESRSEDRATE 45 (H) 11/18/2021   ESRSEDRATE 41 (H) 09/16/2017   CRP 12.0 (H) 07/01/2022   CRP 12.1 (H) 11/18/2021   CRP 8.9 (H) 09/16/2017   REPTSTATUS 01/13/2024 FINAL 01/08/2024   CULT  01/08/2024    NO GROWTH 5 DAYS Performed at [facility], [address], [place], [name] [address]    LABORGA KLEBSIELLA OXYTOCA (A) 11/26/2023     Lab Results  Component Value Date   ALBUMIN 1.7 (L) 01/09/2024   ALBUMIN 1.6 (L) 01/08/2024   ALBUMIN 3.1 (L) 11/26/2023   PREALBUMIN 16.5 (L) 01/07/2022    Lab Results  Component Value Date   MG 1.7 01/08/2024   MG 1.9 12/03/2023   MG 2.0 12/02/2023   Lab Results  Component Value Date   VD25OH 9.40 (L) 01/07/2022    Lab Results  Component Value Date   PREALBUMIN 16.5 (L) 01/07/2022      Latest Ref Rng & Units 01/16/2024    7:13 AM 01/15/2024    7:29 AM 01/14/2024    7:45 AM  CBC EXTENDED  WBC 4.0 - 10.5 K/uL 15.9  17.2  14.5   RBC 4.22 - 5.81 MIL/uL 3.89  4.12  4.35   Hemoglobin 13.0 - 17.0 g/dL 87.5  64.3  32.9   HCT 39.0 - 52.0 %  32.6  34.0  36.7   Platelets 150 - 400 K/uL 406  404  407      There is no height or weight on file to calculate BMI.  Orders:  No orders of the defined types were placed in this encounter.  Meds ordered this encounter  Medications   doxycycline (VIBRA-TABS) 100 MG tablet    Sig: Take 1 tablet (100 mg total) by mouth 2 (two) times daily.    Dispense:  30 tablet    Refill:  0     Procedures: No procedures performed  Clinical Data: No additional findings.  ROS:  All other systems negative, except as noted in the HPI. Review of Systems  Objective: Vital Signs: There were no vitals taken for this visit.  Specialty Comments:  No specialty comments available.  PMFS History: Patient Active Problem List   Diagnosis Date Noted   Acute kidney failure (HCC) 01/09/2024   Acute urinary retention 01/08/2024   S/P bilateral BKA (below knee amputation) (HCC) 01/07/2024   Morbid obesity  with BMI of 70 and over, adult (HCC) 11/28/2023   Aortic atherosclerosis (HCC) 11/28/2023   CKD (chronic kidney disease), stage III (HCC) 11/28/2023   Weakness 04/28/2023   Nephrosis 04/27/2023   OSA (obstructive sleep apnea) 04/26/2023   Hx of iron deficiency anemia 07/07/2022   Below-knee amputation of left lower extremity (HCC)    Bipolar disorder with depression (HCC)    Dyspnea 11/18/2021   Bicuspid aortic valve 09/14/2021   Aneurysm of ascending aorta (HCC) 09/14/2021   DM (diabetes mellitus) with peripheral vascular complication (HCC) 08/20/2021   Essential hypertension 07/17/2021   Cellulitis of left lower leg 04/11/2021   Heart failure with preserved ejection fraction (HCC) 07/22/2020   Acute renal failure superimposed on stage 3a chronic kidney disease (HCC) 07/18/2020   Leukocytosis 05/31/2019   Smoker 03/08/2019   Chronic back pain 02/16/2019   BMI 70 and over, adult (HCC) 02/16/2019   Peripheral nerve disease 02/16/2019   COPD (chronic obstructive pulmonary disease) (HCC) 01/22/2019   Anxiety and depression 03/26/2018   Recurrent chest pain 07/29/2016   Hyperglycemia    Insulin dependent type 2 diabetes mellitus (HCC) 04/29/2015   History of pulmonary embolus (PE) 04/27/2013   CAD (coronary artery disease) 09/10/2008   Past Medical History:  Diagnosis Date   Aneurysm of ascending aorta (HCC) 09/14/2021   Anxiety    Aortic atherosclerosis (HCC) 11/28/2023   Arthritis    "knees, shoulders, hips, ankles" (07/29/2016)   Asthma    Bicuspid aortic valve 09/14/2021   BMI 70 and over, adult (HCC) 02/16/2019   CAD (coronary artery disease)    a. 2017: s/p BMS to distal Cx; b. LHC 05/30/2019: 80% mid, distal RCA s/p DES, 30% narrowing of d LM, widely patent LAD w/ luminal irregularities, widely patent stent in dCX  w/ 90+% stenosis distal to stent beofre small trifurcating obtuse marginal (potentially area of restenosis)   Chronic bronchitis (HCC)    Chronic ulcer of great  toe of right foot (HCC) 09/09/2017   Chronic ulcer of right great toe (HCC) 09/19/2017   COPD (chronic obstructive pulmonary disease) (HCC)    Coronary arteriosclerosis    Deep venous thrombosis (HCC) 02/16/2019   Depression    Diabetes mellitus (HCC)    DVT (deep venous thrombosis) (HCC)    GERD (gastroesophageal reflux disease)    Hyperlipidemia    Hypertension    Migraine    Morbid obesity (HCC)    Morbid  obesity with BMI of 70 and over, adult (HCC) 11/28/2023   Myocardial infarction [facility]) 1996   "light one"   Non-ST elevation (NSTEMI) myocardial infarction (HCC) 07/22/2020   PE (pulmonary embolism) 04/2013   On chronic Xarelto   Peripheral nerve disease    Pneumonia "several times"   Sleep apnea    Type II diabetes mellitus (HCC)     Family History  Problem Relation Age of Onset   Hypertension Brother    Hypertension Father    Diabetes Other    Hyperlipidemia Other     Past Surgical History:  Procedure Laterality Date   ABDOMINAL AORTOGRAM W/LOWER EXTREMITY N/A 10/10/2020   Procedure: ABDOMINAL AORTOGRAM W/LOWER EXTREMITY;  Surgeon: Sherren Kerns, MD;  Location: [facility] INVASIVE CV LAB;  Service: Cardiovascular;  Laterality: N/A;   AMPUTATION Right 09/21/2017   Procedure: RIGHT GREAT TOE AMPUTATION, POSSIBLE VAC;  Surgeon: Tarry Kos, MD;  Location: [facility] OR;  Service: Orthopedics;  Laterality: Right;   AMPUTATION Left 08/13/2020   Procedure: LEFT FOOT 4TH RAY AMPUTATION;  Surgeon: [name], MD;  Location: [facility] OR;  Service: Orthopedics;  Laterality: Left;   AMPUTATION Left 11/20/2021   Procedure: LEFT TRANSMETATARSAL AMPUTATION;  Surgeon: [name], MD;  Location: [facility] OR;  Service: Orthopedics;  Laterality: Left;   AMPUTATION Left 01/08/2022   Procedure: LEFT BELOW KNEE AMPUTATION;  Surgeon: [name], MD;  Location: [facility] OR;  Service: Orthopedics;  Laterality: Left;   AMPUTATION Right 07/07/2022   Procedure: RIGHT BELOW KNEE AMPUTATION;  Surgeon: [name],  MD;  Location: [facility] OR;  Service: Orthopedics;  Laterality: Right;   AORTOGRAM Bilateral 03/13/2021   Procedure: ABDOMINAL AORTOGRAM WITH Left LOWER EXTREMITY RUNOFF;  Surgeon: Leonie Douglas, MD;  Location: [facility] OR;  Service: Vascular;  Laterality: Bilateral;   APPLICATION OF WOUND VAC  01/08/2022   Procedure: APPLICATION OF WOUND VAC;  Surgeon: [name], MD;  Location: [facility] OR;  Service: Orthopedics;;   APPLICATION OF WOUND VAC Right 07/07/2022   Procedure: APPLICATION OF WOUND VAC;  Surgeon: [name], MD;  Location: [facility] OR;  Service: Orthopedics;  Laterality: Right;   CARDIAC CATHETERIZATION  2006   CARDIAC CATHETERIZATION  1996   "@ [facility]; when I had my heart attack"   CARDIAC CATHETERIZATION N/A 07/29/2016   Procedure: Left Heart Cath and Coronary Angiography;  Surgeon: Runell Gess, MD;  Location: [facility] INVASIVE CV LAB;  Service: Cardiovascular;  Laterality: N/A;   CARDIAC CATHETERIZATION N/A 07/29/2016   Procedure: Coronary Stent Intervention;  Surgeon: Runell Gess, MD;  Location: [facility];  Service: Cardiovascular;  Laterality: N/A;   CARPAL TUNNEL RELEASE Bilateral    CORONARY ANGIOPLASTY WITH STENT PLACEMENT  07/29/2016   CORONARY ARTERY BYPASS GRAFT N/A 07/24/2020   Procedure: CORONARY ARTERY BYPASS GRAFTING (CABG), ON PUMP, TIMES FOUR, USING LEFT INTERNAL MAMMARY ARTERY AND ENDOSCOPICALLY HARVESTED RIGHT GREATER SAPHENOUS VEIN;  Surgeon: Corliss Skains, MD;  Location: [facility] OR;  Service: Open Heart Surgery;  Laterality: N/A;  FLOW TAC   CORONARY PRESSURE/FFR STUDY N/A 07/22/2020   Procedure: INTRAVASCULAR PRESSURE WIRE/FFR STUDY;  Surgeon: Marykay Lex, MD;  Location: [facility] INVASIVE CV LAB;  Service: Cardiovascular;  Laterality: N/A;   CORONARY STENT INTERVENTION N/A 05/30/2019   Procedure: CORONARY STENT INTERVENTION;  Surgeon: Lyn Records, MD;  Location: [facility];  Service: Cardiovascular;  Laterality: N/A;   CORONARY STENT INTERVENTION N/A 11/02/2019    Procedure: CORONARY  STENT INTERVENTION;  Surgeon: Iran Ouch, MD;  Location: [facility];  Service: Cardiovascular;  Laterality: N/A;   ESOPHAGOGASTRODUODENOSCOPY N/A 09/22/2017   Procedure: ESOPHAGOGASTRODUODENOSCOPY (EGD);  Surgeon: Meryl Dare, MD;  Location: [facility] ENDOSCOPY;  Service: Endoscopy;  Laterality: N/A;   KNEE ARTHROSCOPY Bilateral    "2 on left; 1 on the right"   LEFT HEART CATH AND CORONARY ANGIOGRAPHY N/A 11/02/2019   Procedure: LEFT HEART CATH AND CORONARY ANGIOGRAPHY;  Surgeon: Iran Ouch, MD;  Location: [facility];  Service: Cardiovascular;  Laterality: N/A;   LEFT HEART CATH AND CORONARY ANGIOGRAPHY N/A 07/22/2020   Procedure: LEFT HEART CATH AND CORONARY ANGIOGRAPHY;  Surgeon: Marykay Lex, MD;  Location: [facility] INVASIVE CV LAB;  Service: Cardiovascular;  Laterality: N/A;   LEFT HEART CATH AND CORS/GRAFTS ANGIOGRAPHY N/A 08/17/2021   Procedure: LEFT HEART CATH AND CORS/GRAFTS ANGIOGRAPHY;  Surgeon: Yvonne Kendall, MD;  Location: [facility];  Service: Cardiovascular;  Laterality: N/A;   PERIPHERAL VASCULAR INTERVENTION Left 10/11/2020   popliteal and SFA stent placement    PERIPHERAL VASCULAR INTERVENTION Left 10/10/2020   Procedure: PERIPHERAL VASCULAR INTERVENTION;  Surgeon: Sherren Kerns, MD;  Location: [facility];  Service: Cardiovascular;  Laterality: Left;   RIGHT/LEFT HEART CATH AND CORONARY ANGIOGRAPHY N/A 05/30/2019   Procedure: RIGHT/LEFT HEART CATH AND CORONARY ANGIOGRAPHY;  Surgeon: Lyn Records, MD;  [facility]: [facility];  Service: Cardiovascular;  Laterality: N/A;   RIGHT/LEFT HEART CATH AND CORONARY/GRAFT ANGIOGRAPHY N/A 06/17/2022   Procedure: RIGHT/LEFT HEART CATH AND CORONARY/GRAFT ANGIOGRAPHY;  Surgeon: Lennette Bihari, MD;  Location: [facility];  Service: Cardiovascular;  Laterality: N/A;   SHOULDER OPEN ROTATOR CUFF REPAIR Bilateral    TEE WITHOUT CARDIOVERSION N/A 07/24/2020   Procedure:  TRANSESOPHAGEAL ECHOCARDIOGRAM (TEE);  Surgeon: Corliss Skains, MD;  Location: [facility] OR;  Service: Open Heart Surgery;  Laterality: N/A;   Social History   Occupational History   Occupation: disabled     Associate Professor: UNEMPLOYED  Tobacco Use   Smoking status: Former    Current packs/day: 0.00    Average packs/day: 1 pack/day for 34.0 years (34.0 ttl pk-yrs)    Types: Cigarettes    Start date: 07/12/1986    Quit date: 07/12/2020    Years since quitting: 3.6   Smokeless tobacco: Former    Types: Snuff, Chew  Vaping Use   Vaping status: Never Used  Substance and Sexual Activity   Alcohol use: Yes    Comment: RARE   Drug use: No   Sexual activity: Yes

## 2024-03-19 ENCOUNTER — Other Ambulatory Visit (HOSPITAL_COMMUNITY): Payer: Self-pay

## 2024-03-21 ENCOUNTER — Other Ambulatory Visit: Payer: Self-pay | Admitting: Cardiovascular Disease

## 2024-03-23 ENCOUNTER — Telehealth: Payer: Self-pay | Admitting: Orthopedic Surgery

## 2024-03-23 ENCOUNTER — Other Ambulatory Visit: Payer: Self-pay | Admitting: Orthopedic Surgery

## 2024-03-23 ENCOUNTER — Other Ambulatory Visit: Payer: Self-pay | Admitting: Physician Assistant

## 2024-03-23 MED ORDER — DOXYCYCLINE HYCLATE 100 MG PO TABS
100.0000 mg | ORAL_TABLET | Freq: Two times a day (BID) | ORAL | 0 refills | Status: DC
Start: 2024-03-23 — End: 2024-04-06

## 2024-03-23 NOTE — Telephone Encounter (Signed)
 Pt lost his doxycycline Rx and asking for another one be sent to pharmacy

## 2024-03-23 NOTE — Telephone Encounter (Signed)
 Pt informed

## 2024-03-23 NOTE — Telephone Encounter (Signed)
 Patient called and said he lost his antibiotics and wants a refill. [phone]

## 2024-03-26 ENCOUNTER — Other Ambulatory Visit (HOSPITAL_COMMUNITY): Payer: Self-pay

## 2024-03-26 MED ORDER — PANTOPRAZOLE SODIUM 40 MG PO TBEC
40.0000 mg | DELAYED_RELEASE_TABLET | Freq: Every day | ORAL | 3 refills | Status: AC
  Filled 2024-03-26 – 2024-05-28 (×7): qty 90, 90d supply, fill #0
  Filled 2024-09-24 – 2024-09-29 (×2): qty 90, 90d supply, fill #1
  Filled 2024-12-27 – 2025-01-08 (×3): qty 90, 90d supply, fill #2

## 2024-03-26 MED ORDER — CEPHALEXIN 500 MG PO CAPS
500.0000 mg | ORAL_CAPSULE | Freq: Three times a day (TID) | ORAL | 0 refills | Status: DC
Start: 2024-03-26 — End: 2024-03-28
  Filled 2024-03-26: qty 21, 7d supply, fill #0

## 2024-03-26 MED ORDER — ALPRAZOLAM 1 MG PO TABS
1.0000 mg | ORAL_TABLET | Freq: Two times a day (BID) | ORAL | 0 refills | Status: DC | PRN
  Filled 2024-03-26: qty 60, 30d supply, fill #0

## 2024-03-26 MED ORDER — OXYCODONE HCL 15 MG PO TABS
15.0000 mg | ORAL_TABLET | ORAL | 0 refills | Status: DC
  Filled 2024-03-26: qty 95, 16d supply, fill #0
  Filled 2024-03-26: qty 55, 9d supply, fill #0

## 2024-03-26 MED ORDER — RANOLAZINE ER 500 MG PO TB12
500.0000 mg | ORAL_TABLET | Freq: Two times a day (BID) | ORAL | 3 refills | Status: DC
Start: 2024-03-26 — End: 2024-04-24
  Filled 2024-03-26 – 2024-04-17 (×2): qty 180, 90d supply, fill #0

## 2024-03-26 MED ORDER — FUROSEMIDE 40 MG PO TABS
40.0000 mg | ORAL_TABLET | Freq: Every day | ORAL | 3 refills | Status: DC
Start: 2024-03-26 — End: 2024-04-06
  Filled 2024-03-26: qty 135, 90d supply, fill #0

## 2024-03-26 MED ORDER — OXYCODONE HCL 15 MG PO TABS
15.0000 mg | ORAL_TABLET | ORAL | 0 refills | Status: DC
  Filled 2024-03-26: qty 50, 9d supply, fill #0

## 2024-03-27 ENCOUNTER — Encounter (HOSPITAL_COMMUNITY): Payer: Self-pay | Admitting: Emergency Medicine

## 2024-03-27 ENCOUNTER — Emergency Department (HOSPITAL_COMMUNITY)

## 2024-03-27 ENCOUNTER — Inpatient Hospital Stay (HOSPITAL_COMMUNITY)
Admission: EM | Admit: 2024-03-27 | Discharge: 2024-04-06 | DRG: 291 | Disposition: A | Discharge status: Home / Self Care | Attending: Family Medicine | Admitting: Family Medicine

## 2024-03-27 ENCOUNTER — Other Ambulatory Visit: Payer: Self-pay

## 2024-03-27 ENCOUNTER — Ambulatory Visit (INDEPENDENT_AMBULATORY_CARE_PROVIDER_SITE_OTHER): Admitting: Orthopedic Surgery

## 2024-03-27 DIAGNOSIS — E871 Hypo-osmolality and hyponatremia: Secondary | ICD-10-CM | POA: Diagnosis not present

## 2024-03-27 DIAGNOSIS — K219 Gastro-esophageal reflux disease without esophagitis: Secondary | ICD-10-CM | POA: Diagnosis present

## 2024-03-27 DIAGNOSIS — E1165 Type 2 diabetes mellitus with hyperglycemia: Secondary | ICD-10-CM | POA: Diagnosis present

## 2024-03-27 DIAGNOSIS — Z89511 Acquired absence of right leg below knee: Secondary | ICD-10-CM

## 2024-03-27 DIAGNOSIS — I130 Hypertensive heart and chronic kidney disease with heart failure and stage 1 through stage 4 chronic kidney disease, or unspecified chronic kidney disease: Secondary | ICD-10-CM | POA: Diagnosis not present

## 2024-03-27 DIAGNOSIS — R0600 Dyspnea, unspecified: Secondary | ICD-10-CM | POA: Diagnosis present

## 2024-03-27 DIAGNOSIS — Z86718 Personal history of other venous thrombosis and embolism: Secondary | ICD-10-CM

## 2024-03-27 DIAGNOSIS — J4489 Other specified chronic obstructive pulmonary disease: Secondary | ICD-10-CM | POA: Diagnosis present

## 2024-03-27 DIAGNOSIS — L89152 Pressure ulcer of sacral region, stage 2: Secondary | ICD-10-CM | POA: Diagnosis present

## 2024-03-27 DIAGNOSIS — I959 Hypotension, unspecified: Secondary | ICD-10-CM | POA: Diagnosis present

## 2024-03-27 DIAGNOSIS — Z955 Presence of coronary angioplasty implant and graft: Secondary | ICD-10-CM

## 2024-03-27 DIAGNOSIS — M7989 Other specified soft tissue disorders: Secondary | ICD-10-CM | POA: Diagnosis present

## 2024-03-27 DIAGNOSIS — Z89512 Acquired absence of left leg below knee: Secondary | ICD-10-CM

## 2024-03-27 DIAGNOSIS — S88111A Complete traumatic amputation at level between knee and ankle, right lower leg, initial encounter: Secondary | ICD-10-CM

## 2024-03-27 DIAGNOSIS — E119 Type 2 diabetes mellitus without complications: Secondary | ICD-10-CM

## 2024-03-27 DIAGNOSIS — Z7901 Long term (current) use of anticoagulants: Secondary | ICD-10-CM

## 2024-03-27 DIAGNOSIS — I493 Ventricular premature depolarization: Secondary | ICD-10-CM | POA: Diagnosis present

## 2024-03-27 DIAGNOSIS — Z794 Long term (current) use of insulin: Secondary | ICD-10-CM

## 2024-03-27 DIAGNOSIS — E1122 Type 2 diabetes mellitus with diabetic chronic kidney disease: Secondary | ICD-10-CM | POA: Diagnosis present

## 2024-03-27 DIAGNOSIS — G4730 Sleep apnea, unspecified: Secondary | ICD-10-CM | POA: Diagnosis present

## 2024-03-27 DIAGNOSIS — R000 Tachycardia, unspecified: Secondary | ICD-10-CM | POA: Diagnosis present

## 2024-03-27 DIAGNOSIS — I10 Essential (primary) hypertension: Secondary | ICD-10-CM | POA: Diagnosis present

## 2024-03-27 DIAGNOSIS — N179 Acute kidney failure, unspecified: Secondary | ICD-10-CM | POA: Diagnosis present

## 2024-03-27 DIAGNOSIS — I252 Old myocardial infarction: Secondary | ICD-10-CM

## 2024-03-27 DIAGNOSIS — E875 Hyperkalemia: Secondary | ICD-10-CM | POA: Diagnosis not present

## 2024-03-27 DIAGNOSIS — Z720 Tobacco use: Secondary | ICD-10-CM

## 2024-03-27 DIAGNOSIS — R600 Localized edema: Principal | ICD-10-CM

## 2024-03-27 DIAGNOSIS — R339 Retention of urine, unspecified: Secondary | ICD-10-CM | POA: Diagnosis present

## 2024-03-27 DIAGNOSIS — M159 Polyosteoarthritis, unspecified: Secondary | ICD-10-CM | POA: Diagnosis present

## 2024-03-27 DIAGNOSIS — Z951 Presence of aortocoronary bypass graft: Secondary | ICD-10-CM

## 2024-03-27 DIAGNOSIS — F419 Anxiety disorder, unspecified: Secondary | ICD-10-CM | POA: Diagnosis present

## 2024-03-27 DIAGNOSIS — G43909 Migraine, unspecified, not intractable, without status migrainosus: Secondary | ICD-10-CM | POA: Diagnosis present

## 2024-03-27 DIAGNOSIS — Z6841 Body Mass Index (BMI) 40.0 and over, adult: Secondary | ICD-10-CM

## 2024-03-27 DIAGNOSIS — Z833 Family history of diabetes mellitus: Secondary | ICD-10-CM

## 2024-03-27 DIAGNOSIS — E785 Hyperlipidemia, unspecified: Secondary | ICD-10-CM | POA: Diagnosis present

## 2024-03-27 DIAGNOSIS — I700 Atherosclerosis of aorta: Secondary | ICD-10-CM | POA: Diagnosis present

## 2024-03-27 DIAGNOSIS — Z79899 Other long term (current) drug therapy: Secondary | ICD-10-CM

## 2024-03-27 DIAGNOSIS — Z791 Long term (current) use of non-steroidal anti-inflammatories (NSAID): Secondary | ICD-10-CM

## 2024-03-27 DIAGNOSIS — N170 Acute kidney failure with tubular necrosis: Secondary | ICD-10-CM | POA: Diagnosis present

## 2024-03-27 DIAGNOSIS — Z7902 Long term (current) use of antithrombotics/antiplatelets: Secondary | ICD-10-CM

## 2024-03-27 DIAGNOSIS — I2510 Atherosclerotic heart disease of native coronary artery without angina pectoris: Secondary | ICD-10-CM | POA: Diagnosis present

## 2024-03-27 DIAGNOSIS — N400 Enlarged prostate without lower urinary tract symptoms: Secondary | ICD-10-CM | POA: Diagnosis present

## 2024-03-27 DIAGNOSIS — N1832 Chronic kidney disease, stage 3b: Secondary | ICD-10-CM | POA: Diagnosis present

## 2024-03-27 DIAGNOSIS — Z8249 Family history of ischemic heart disease and other diseases of the circulatory system: Secondary | ICD-10-CM

## 2024-03-27 DIAGNOSIS — G8929 Other chronic pain: Secondary | ICD-10-CM | POA: Diagnosis present

## 2024-03-27 DIAGNOSIS — D649 Anemia, unspecified: Secondary | ICD-10-CM | POA: Diagnosis present

## 2024-03-27 DIAGNOSIS — L8990 Pressure ulcer of unspecified site, unspecified stage: Secondary | ICD-10-CM

## 2024-03-27 DIAGNOSIS — I5033 Acute on chronic diastolic (congestive) heart failure: Secondary | ICD-10-CM | POA: Diagnosis present

## 2024-03-27 DIAGNOSIS — S88112A Complete traumatic amputation at level between knee and ankle, left lower leg, initial encounter: Secondary | ICD-10-CM

## 2024-03-27 DIAGNOSIS — Z86711 Personal history of pulmonary embolism: Secondary | ICD-10-CM | POA: Diagnosis present

## 2024-03-27 DIAGNOSIS — F32A Depression, unspecified: Secondary | ICD-10-CM | POA: Diagnosis present

## 2024-03-27 DIAGNOSIS — E66813 Obesity, class 3: Secondary | ICD-10-CM | POA: Diagnosis present

## 2024-03-27 DIAGNOSIS — B964 Proteus (mirabilis) (morganii) as the cause of diseases classified elsewhere: Secondary | ICD-10-CM | POA: Diagnosis not present

## 2024-03-27 DIAGNOSIS — N390 Urinary tract infection, site not specified: Secondary | ICD-10-CM

## 2024-03-27 LAB — CBC WITH DIFFERENTIAL/PLATELET
Abs Immature Granulocytes: 0.03 10*3/uL (ref 0.00–0.07)
Basophils Absolute: 0.1 10*3/uL (ref 0.0–0.1)
Basophils Relative: 1 %
Eosinophils Absolute: 0.3 10*3/uL (ref 0.0–0.5)
Eosinophils Relative: 4 %
HCT: 40.4 % (ref 39.0–52.0)
Hemoglobin: 13 g/dL (ref 13.0–17.0)
Immature Granulocytes: 0 %
Lymphocytes Relative: 34 %
Lymphs Abs: 2.6 10*3/uL (ref 0.7–4.0)
MCH: 26.6 pg (ref 26.0–34.0)
MCHC: 32.2 g/dL (ref 30.0–36.0)
MCV: 82.6 fL (ref 80.0–100.0)
Monocytes Absolute: 0.7 10*3/uL (ref 0.1–1.0)
Monocytes Relative: 9 %
Neutro Abs: 4.1 10*3/uL (ref 1.7–7.7)
Neutrophils Relative %: 52 %
Platelets: 267 10*3/uL (ref 150–400)
RBC: 4.89 MIL/uL (ref 4.22–5.81)
RDW: 14.7 % (ref 11.5–15.5)
WBC: 7.8 10*3/uL (ref 4.0–10.5)
nRBC: 0 % (ref 0.0–0.2)

## 2024-03-27 LAB — BASIC METABOLIC PANEL WITH GFR
Anion gap: 8 (ref 5–15)
BUN: 27 mg/dL — ABNORMAL HIGH (ref 6–20)
CO2: 27 mmol/L (ref 22–32)
Calcium: 9.1 mg/dL (ref 8.9–10.3)
Chloride: 104 mmol/L (ref 98–111)
Creatinine, Ser: 1.68 mg/dL — ABNORMAL HIGH (ref 0.61–1.24)
GFR, Estimated: 49 mL/min — ABNORMAL LOW (ref >60)
Glucose, Bld: 199 mg/dL — ABNORMAL HIGH (ref 70–99)
Potassium: 4.3 mmol/L (ref 3.5–5.1)
Sodium: 139 mmol/L (ref 135–145)

## 2024-03-27 LAB — TROPONIN I (HIGH SENSITIVITY): Troponin I (High Sensitivity): 37 ng/L — ABNORMAL HIGH (ref <18)

## 2024-03-27 MED ORDER — FUROSEMIDE 10 MG/ML IJ SOLN
40.0000 mg | INTRAMUSCULAR | Status: AC
  Administered 2024-03-27: 40 mg via INTRAVENOUS
  Filled 2024-03-27: qty 4

## 2024-03-27 NOTE — ED Provider Triage Note (Signed)
 Emergency Medicine Provider Triage Evaluation Note  Joseph [name] , a 53 y.o. male  was evaluated in triage.  Pt complains of shortness of breath, orthopnea, worsening over the last few days.  Patient endorses body swelling.  Went to his PCP for routine blood work.  Was told that his BNP was significantly elevated and he should come to the ED.Marland Kitchen  Review of Systems  Positive:  Negative:   Physical Exam  BP (!) 194/103   Pulse (!) 106   Temp 98 F (36.7 C)   Resp 18   Wt (!) 172.4 kg   SpO2 100%   BMI 71.80 kg/m  Gen:   Awake, no distress   Resp:  Normal effort  MSK:   Moves extremities without difficulty  Other:    Medical Decision Making  Medically screening exam initiated at 8:50 PM.  Appropriate orders placed.  Joseph [name] was informed that the remainder of the evaluation will be completed by another provider, this initial triage assessment does not replace that evaluation, and the importance of remaining in the ED until their evaluation is complete.  Tachycardic, hypertensive.  Suspect fluid overload on the patient.   Anders Simmonds T, DO 03/27/24 2051

## 2024-03-27 NOTE — ED Triage Notes (Signed)
 PT BIB EMS from home. Pt went to PCP today with SOB and had bloodwork drawn. Pt was called and told to come to ER for elevated BNP 1700. Pt says SOB worse when laying down and exertion. Has been taking his diuretics as prescribed. Denies pain.

## 2024-03-27 NOTE — ED Provider Notes (Signed)
 [place] EMERGENCY DEPARTMENT AT [facility] Provider Note   CSN: [id number] Arrival date & time: 03/27/24  2018     History {Add pertinent medical, surgical, social history, OB history to HPI:1} Chief Complaint  Patient presents with   Leg Swelling   Abnormal Labs    Joseph [name] is a 53 y.o. male.  The history is provided by the patient and medical records.    53 year old male with history of coronary artery disease, hypertension, diabetes, CKD, CHF, COPD, presenting to the ED with abnormal labs.  Seen by PCP yesterday due to increased lower extremity swelling, shortness of breath, and fatigue.  He was called today and notified that his BNP was 1700.  He states symptoms actually seem worse than yesterday, basically stayed upright in his chair overnight as he was unable to lie flat.  He has had decreased urine output despite taking his Lasix as prescribed.  He denies any significant chest pain.  Does have history of CHF, last echo 04/27/2023 with EF of 55 to 60%.  Home Medications Prior to Admission medications   Medication Sig Start Date End Date Taking? Authorizing Provider  acetaminophen (TYLENOL) 500 MG tablet Take 1,000 mg by mouth 3 (three) times daily as needed for headache.    [provider]  albuterol (VENTOLIN HFA) 108 (90 Base) MCG/ACT inhaler Inhale 2 puffs into the lungs every 6 (six) hours as needed for shortness of breath.    [provider]  ALPRAZolam Prudy Feeler) 1 MG tablet Take 1 tablet (1 mg total) by mouth 2 (two) times daily as needed. Patient taking differently: Take 1 mg by mouth 2 (two) times daily as needed for anxiety or sleep. 12/06/23     ALPRAZolam (XANAX) 1 MG tablet Take 1 tablet (1 mg total) by mouth 2 (two) times daily as needed. 03/26/24     aspirin EC 81 MG tablet Take 81 mg by mouth in the morning. Swallow whole.    [provider]  atorvastatin (LIPITOR) 80 MG tablet TAKE ONE TABLET (80 MG TOTAL) BY MOUTH DAILY  AT 6PM (APPOINTMENT OVERDUE: NEEDS APPOINTMENT FOR FURTHER REFILLS PLEASE CALL OFFICE) 02/07/24   Tereso Newcomer T, PA-C  buPROPion (WELLBUTRIN XL) 150 MG 24 hr tablet Take 150 mg by mouth in the morning and at bedtime.    [provider]  carvedilol (COREG) 6.25 MG tablet TAKE ONE TABLET (6.25 MG TOTAL) BY MOUTH TWICE DAILY @ 9AM & 5PM 01/26/24   Chilton Si, MD  cephALEXin (KEFLEX) 500 MG capsule Take 1 capsule (500 mg total) by mouth 3 (three) times daily for 7 days. 03/26/24     clopidogrel (PLAVIX) 75 MG tablet Take 1 tablet (75 mg total) by mouth daily. 03/21/24   Chilton Si, MD  Continuous Blood Gluc Sensor (FREESTYLE LIBRE 14 DAY SENSOR) MISC Apply topically 4 (four) times daily. 09/23/22   [provider]  doxycycline (VIBRA-TABS) 100 MG tablet Take 1 tablet (100 mg total) by mouth 2 (two) times daily. 03/23/24   [name], MD  escitalopram (LEXAPRO) 10 MG tablet Take 10 mg by mouth daily. 01/27/22   [provider]  fluconazole (DIFLUCAN) 100 MG tablet Take 100 mg by mouth every Saturday.    [provider]  fluticasone (FLONASE) 50 MCG/ACT nasal spray Place 2 sprays into both nostrils daily.    [provider]  furosemide (LASIX) 40 MG tablet Take 1 tablet (40 mg total) by mouth daily and as directed for  90 days. 03/26/24     icosapent Ethyl (VASCEPA) 1 g capsule TAKE TWO CAPSULES (2 G TOTAL) BY MOUTH TWICE DAILY APPOINTMENT OVERDUE: NEEDS APPOINTMENT FOR FURTHER REFILLS PLEASE CALL OFFICE 01/30/24   Tereso Newcomer T, PA-C  insulin aspart (NOVOLOG) 100 UNIT/ML injection Inject 8 Units into the skin 3 (three) times daily with meals. Hold if eats less than 50% of meals 01/14/24   Burnadette Pop, MD  insulin glargine (LANTUS SOLOSTAR) 100 UNIT/ML Solostar Pen Inject 38 Units into the skin 2 (two) times daily. 01/14/24   Burnadette Pop, MD  Insulin Pen Needle 32G X 4 MM MISC Use as directed with insulin pens 05/03/23   Dameron, Nolberto Hanlon, DO  ketotifen  (ZADITOR) 0.035 % ophthalmic solution Place 1 drop into the left eye 2 (two) times daily. 12/03/23   Rolly Salter, MD  lidocaine (LIDODERM) 5 % Place 1 patch onto the skin daily.    [provider]  loratadine (CLARITIN) 10 MG tablet Take 10 mg by mouth daily. 09/27/22   [provider]  naloxone [facility]) nasal spray 4 mg/0.1 mL Place 4 mg into the nose as needed (overdose).    [provider]  nitroGLYCERIN (NITROSTAT) 0.4 MG SL tablet Place 1 tablet (0.4 mg total) under the tongue every 5 (five) minutes as needed. Patient taking differently: Place 0.4 mg under the tongue every 5 (five) minutes as needed for chest pain. 10/08/22   Gaston Islam., NP  oxyCODONE (ROXICODONE) 15 MG immediate release tablet Take 1 tablet (15 mg total) by mouth every 4 to 6 hours as needed to last 30 days. 02/23/24     oxyCODONE (ROXICODONE) 15 MG immediate release tablet Take 1 tablet (15 mg total) by mouth every 4 (four) - 6 (six) hours for 30 days. 02/23/24     oxyCODONE (ROXICODONE) 15 MG immediate release tablet Take 1 tablet (15 mg total) by mouth every 4 (four) hours. 03/26/24     pantoprazole (PROTONIX) 40 MG tablet Take 1 tablet (40 mg total) by mouth daily. 09/23/17   Hongalgi, Maximino Greenland, MD  pantoprazole (PROTONIX) 40 MG tablet Take 1 tablet (40 mg total) by mouth daily. 03/26/24     pregabalin (LYRICA) 150 MG capsule Take 150 mg by mouth 3 (three) times daily. 09/10/22   [provider]  promethazine (PHENERGAN) 25 MG tablet Take 25 mg by mouth every 4 (four) hours as needed for nausea or vomiting. 09/10/22   [provider]  ranolazine (RANEXA) 500 MG 12 hr tablet Take 1 tablet (500 mg total) by mouth 2 (two) times daily. 10/27/23   Tereso Newcomer T, PA-C  ranolazine (RANEXA) 500 MG 12 hr tablet Take 1 tablet (500 mg total) by mouth 2 (two) times daily. 03/26/24     tamsulosin (FLOMAX) 0.4 MG CAPS capsule Take 1 capsule (0.4 mg total) by mouth daily. 01/14/24   Burnadette Pop, MD  tiZANidine (ZANAFLEX) 4 MG tablet Take 4 mg by mouth 3 (three) times daily. 11/25/23   [provider]      Allergies    Sulfa antibiotics    Review of Systems   Review of Systems  Respiratory:  Positive for shortness of breath.   Cardiovascular:  Positive for leg swelling.  All other systems reviewed and are negative.   Physical Exam Updated Vital Signs BP (!) 194/103   Pulse (!) 106   Temp 98 F (36.7 C)   Resp 18   Wt (!) 172.4 kg  SpO2 100%   BMI 71.80 kg/m   Physical Exam Vitals and nursing note reviewed.  Constitutional:      Appearance: He is well-developed.  HENT:     Head: Normocephalic and atraumatic.  Eyes:     Conjunctiva/sclera: Conjunctivae normal.     Pupils: Pupils are equal, round, and reactive to light.  Cardiovascular:     Rate and Rhythm: Normal rate and regular rhythm.     Heart sounds: Normal heart sounds.  Pulmonary:     Effort: Pulmonary effort is normal.     Breath sounds: Normal breath sounds.  Abdominal:     General: Bowel sounds are normal.     Palpations: Abdomen is soft.  Musculoskeletal:        General: Normal range of motion.     Cervical back: Normal range of motion.     Comments: Bilateral BKA's  Skin:    General: Skin is warm and dry.  Neurological:     Mental Status: He is alert and oriented to person, place, and time.     ED Results / Procedures / Treatments   Labs (all labs ordered are listed, but only abnormal results are displayed) Labs Reviewed  CBC WITH DIFFERENTIAL/PLATELET  BASIC METABOLIC PANEL WITH GFR  BRAIN NATRIURETIC PEPTIDE  TROPONIN I (HIGH SENSITIVITY)    EKG EKG Interpretation Date/Time:  Tuesday March 27 2024 20:46:14 EDT Ventricular Rate:  106 PR Interval:  184 QRS Duration:  110 QT Interval:  340 QTC Calculation: 451 R Axis:   -51  Text Interpretation: Sinus tachycardia with occasional Premature ventricular complexes Incomplete right bundle branch block Left anterior  fascicular block Septal infarct , age undetermined Possible Lateral infarct , age undetermined Abnormal ECG When compared with ECG of 07-Jan-2024 00:48, PREVIOUS ECG IS PRESENT Confirmed by Anders Simmonds [phone]) on 03/27/2024 8:49:24 PM  Radiology No results found.  Procedures Procedures  {Document cardiac monitor, telemetry assessment procedure when appropriate:1}  Medications Ordered in ED Medications  furosemide (LASIX) injection 40 mg (has no administration in time range)    ED Course/ Medical Decision Making/ A&P   {   Click here for ABCD2, HEART and other calculatorsREFRESH Note before signing :1}                              Medical Decision Making Amount and/or Complexity of Data Reviewed Labs: ordered. Radiology: ordered.  Risk Prescription drug management.   ***  {Document critical care time when appropriate:1} {Document review of labs and clinical decision tools ie heart score, Chads2Vasc2 etc:1}  {Document your independent review of radiology images, and any outside records:1} {Document your discussion with family members, caretakers, and with consultants:1} {Document social determinants of health affecting pt's care:1} {Document your decision making why or why not admission, treatments were needed:1} Final Clinical Impression(s) / ED Diagnoses Final diagnoses:  None    Rx / DC Orders ED Discharge Orders     None

## 2024-03-28 ENCOUNTER — Encounter: Payer: Self-pay | Admitting: Orthopedic Surgery

## 2024-03-28 ENCOUNTER — Observation Stay (HOSPITAL_COMMUNITY)

## 2024-03-28 ENCOUNTER — Other Ambulatory Visit (HOSPITAL_COMMUNITY)

## 2024-03-28 DIAGNOSIS — E66813 Obesity, class 3: Secondary | ICD-10-CM | POA: Diagnosis present

## 2024-03-28 DIAGNOSIS — I200 Unstable angina: Secondary | ICD-10-CM | POA: Diagnosis not present

## 2024-03-28 DIAGNOSIS — R0600 Dyspnea, unspecified: Secondary | ICD-10-CM | POA: Diagnosis present

## 2024-03-28 DIAGNOSIS — I959 Hypotension, unspecified: Secondary | ICD-10-CM | POA: Diagnosis present

## 2024-03-28 DIAGNOSIS — I700 Atherosclerosis of aorta: Secondary | ICD-10-CM | POA: Diagnosis present

## 2024-03-28 DIAGNOSIS — Z89512 Acquired absence of left leg below knee: Secondary | ICD-10-CM | POA: Diagnosis not present

## 2024-03-28 DIAGNOSIS — E871 Hypo-osmolality and hyponatremia: Secondary | ICD-10-CM | POA: Diagnosis not present

## 2024-03-28 DIAGNOSIS — Z89511 Acquired absence of right leg below knee: Secondary | ICD-10-CM | POA: Diagnosis not present

## 2024-03-28 DIAGNOSIS — E785 Hyperlipidemia, unspecified: Secondary | ICD-10-CM | POA: Diagnosis present

## 2024-03-28 DIAGNOSIS — B964 Proteus (mirabilis) (morganii) as the cause of diseases classified elsewhere: Secondary | ICD-10-CM | POA: Diagnosis not present

## 2024-03-28 DIAGNOSIS — Z6841 Body Mass Index (BMI) 40.0 and over, adult: Secondary | ICD-10-CM | POA: Diagnosis not present

## 2024-03-28 DIAGNOSIS — R600 Localized edema: Secondary | ICD-10-CM | POA: Diagnosis not present

## 2024-03-28 DIAGNOSIS — J4489 Other specified chronic obstructive pulmonary disease: Secondary | ICD-10-CM | POA: Diagnosis present

## 2024-03-28 DIAGNOSIS — L89152 Pressure ulcer of sacral region, stage 2: Secondary | ICD-10-CM | POA: Diagnosis present

## 2024-03-28 DIAGNOSIS — N170 Acute kidney failure with tubular necrosis: Secondary | ICD-10-CM | POA: Diagnosis present

## 2024-03-28 DIAGNOSIS — D649 Anemia, unspecified: Secondary | ICD-10-CM | POA: Diagnosis present

## 2024-03-28 DIAGNOSIS — I10 Essential (primary) hypertension: Secondary | ICD-10-CM | POA: Diagnosis not present

## 2024-03-28 DIAGNOSIS — Z7901 Long term (current) use of anticoagulants: Secondary | ICD-10-CM | POA: Diagnosis not present

## 2024-03-28 DIAGNOSIS — E1122 Type 2 diabetes mellitus with diabetic chronic kidney disease: Secondary | ICD-10-CM | POA: Diagnosis present

## 2024-03-28 DIAGNOSIS — N179 Acute kidney failure, unspecified: Secondary | ICD-10-CM | POA: Diagnosis not present

## 2024-03-28 DIAGNOSIS — R079 Chest pain, unspecified: Secondary | ICD-10-CM | POA: Diagnosis not present

## 2024-03-28 DIAGNOSIS — N1832 Chronic kidney disease, stage 3b: Secondary | ICD-10-CM | POA: Diagnosis present

## 2024-03-28 DIAGNOSIS — E1165 Type 2 diabetes mellitus with hyperglycemia: Secondary | ICD-10-CM | POA: Diagnosis present

## 2024-03-28 DIAGNOSIS — N390 Urinary tract infection, site not specified: Secondary | ICD-10-CM | POA: Diagnosis not present

## 2024-03-28 DIAGNOSIS — I5033 Acute on chronic diastolic (congestive) heart failure: Secondary | ICD-10-CM | POA: Diagnosis present

## 2024-03-28 DIAGNOSIS — Z951 Presence of aortocoronary bypass graft: Secondary | ICD-10-CM | POA: Diagnosis not present

## 2024-03-28 DIAGNOSIS — Z794 Long term (current) use of insulin: Secondary | ICD-10-CM | POA: Diagnosis not present

## 2024-03-28 DIAGNOSIS — I130 Hypertensive heart and chronic kidney disease with heart failure and stage 1 through stage 4 chronic kidney disease, or unspecified chronic kidney disease: Secondary | ICD-10-CM | POA: Diagnosis present

## 2024-03-28 DIAGNOSIS — F32A Depression, unspecified: Secondary | ICD-10-CM | POA: Diagnosis present

## 2024-03-28 DIAGNOSIS — I25119 Atherosclerotic heart disease of native coronary artery with unspecified angina pectoris: Secondary | ICD-10-CM | POA: Diagnosis not present

## 2024-03-28 LAB — CBG MONITORING, ED
Glucose-Capillary: 268 mg/dL — ABNORMAL HIGH (ref 70–99)
Glucose-Capillary: 254 mg/dL — ABNORMAL HIGH (ref 70–99)
Glucose-Capillary: 310 mg/dL — ABNORMAL HIGH (ref 70–99)
Glucose-Capillary: 421 mg/dL — ABNORMAL HIGH (ref 70–99)

## 2024-03-28 LAB — BRAIN NATRIURETIC PEPTIDE: B Natriuretic Peptide: 90.4 pg/mL (ref 0.0–100.0)

## 2024-03-28 LAB — CBC
HCT: 38.3 % — ABNORMAL LOW (ref 39.0–52.0)
Hemoglobin: 12.3 g/dL — ABNORMAL LOW (ref 13.0–17.0)
MCH: 26.7 pg (ref 26.0–34.0)
MCHC: 32.1 g/dL (ref 30.0–36.0)
MCV: 83.3 fL (ref 80.0–100.0)
Platelets: 225 10*3/uL (ref 150–400)
RBC: 4.6 MIL/uL (ref 4.22–5.81)
RDW: 14.6 % (ref 11.5–15.5)
WBC: 7.7 10*3/uL (ref 4.0–10.5)
nRBC: 0 % (ref 0.0–0.2)

## 2024-03-28 LAB — BASIC METABOLIC PANEL WITH GFR
Anion gap: 9 (ref 5–15)
BUN: 30 mg/dL — ABNORMAL HIGH (ref 6–20)
CO2: 24 mmol/L (ref 22–32)
Calcium: 8.7 mg/dL — ABNORMAL LOW (ref 8.9–10.3)
Chloride: 105 mmol/L (ref 98–111)
Creatinine, Ser: 1.71 mg/dL — ABNORMAL HIGH (ref 0.61–1.24)
GFR, Estimated: 48 mL/min — ABNORMAL LOW (ref >60)
Glucose, Bld: 191 mg/dL — ABNORMAL HIGH (ref 70–99)
Potassium: 4.2 mmol/L (ref 3.5–5.1)
Sodium: 138 mmol/L (ref 135–145)

## 2024-03-28 LAB — TROPONIN I (HIGH SENSITIVITY): Troponin I (High Sensitivity): 33 ng/L — ABNORMAL HIGH (ref <18)

## 2024-03-28 LAB — ECHOCARDIOGRAM COMPLETE
AR max vel: 4.39 cm2
AV Peak grad: 4.3 mmHg
Ao pk vel: 1.04 m/s
Area-P 1/2: 2.99 cm2
S' Lateral: 2.7 cm
Weight: 6080 [oz_av]

## 2024-03-28 LAB — PHOSPHORUS: Phosphorus: 5.2 mg/dL — ABNORMAL HIGH (ref 2.5–4.6)

## 2024-03-28 LAB — MAGNESIUM: Magnesium: 1.3 mg/dL — ABNORMAL LOW (ref 1.7–2.4)

## 2024-03-28 MED ORDER — OXYCODONE HCL 5 MG PO TABS: 15.0000 mg | ORAL_TABLET | Freq: Four times a day (QID) | ORAL | Status: DC | PRN

## 2024-03-28 MED ORDER — ENOXAPARIN SODIUM 80 MG/0.8ML IJ SOSY
80.0000 mg | PREFILLED_SYRINGE | Freq: Every day | INTRAMUSCULAR | Status: DC
  Administered 2024-03-28 – 2024-04-06 (×10): 80 mg via SUBCUTANEOUS
  Filled 2024-03-28 (×11): qty 0.8

## 2024-03-28 MED ORDER — FLUTICASONE PROPIONATE 50 MCG/ACT NA SUSP
2.0000 | Freq: Every day | NASAL | Status: DC
  Administered 2024-03-28 – 2024-04-06 (×9): 2 via NASAL
  Filled 2024-03-28 (×4): qty 16

## 2024-03-28 MED ORDER — CLOPIDOGREL BISULFATE 75 MG PO TABS
75.0000 mg | ORAL_TABLET | Freq: Every day | ORAL | Status: DC
  Administered 2024-03-28 – 2024-04-06 (×10): 75 mg via ORAL
  Filled 2024-03-28 (×10): qty 1

## 2024-03-28 MED ORDER — MELATONIN 5 MG PO TABS
5.0000 mg | ORAL_TABLET | Freq: Every evening | ORAL | Status: DC | PRN
  Administered 2024-04-01: 5 mg via ORAL
  Filled 2024-03-28 (×3): qty 1

## 2024-03-28 MED ORDER — INSULIN GLARGINE-YFGN 100 UNIT/ML ~~LOC~~ SOLN
5.0000 [IU] | Freq: Two times a day (BID) | SUBCUTANEOUS | Status: DC
  Administered 2024-03-28 (×2): 5 [IU] via SUBCUTANEOUS
  Filled 2024-03-28 (×4): qty 0.05

## 2024-03-28 MED ORDER — INSULIN ASPART 100 UNIT/ML IJ SOLN
0.0000 [IU] | Freq: Three times a day (TID) | INTRAMUSCULAR | Status: DC
  Administered 2024-03-28 (×2): 5 [IU] via SUBCUTANEOUS
  Administered 2024-03-28 – 2024-03-29 (×3): 7 [IU] via SUBCUTANEOUS
  Administered 2024-03-29 – 2024-03-30 (×2): 9 [IU] via SUBCUTANEOUS

## 2024-03-28 MED ORDER — HYDROMORPHONE HCL 1 MG/ML IJ SOLN: 0.5000 mg | INTRAMUSCULAR | Status: DC | PRN

## 2024-03-28 MED ORDER — ACETAMINOPHEN 325 MG PO TABS
650.0000 mg | ORAL_TABLET | Freq: Four times a day (QID) | ORAL | Status: DC | PRN
  Administered 2024-03-28 – 2024-04-05 (×4): 650 mg via ORAL
  Filled 2024-03-28 (×4): qty 2

## 2024-03-28 MED ORDER — OXYCODONE HCL 5 MG PO TABS
15.0000 mg | ORAL_TABLET | Freq: Four times a day (QID) | ORAL | Status: DC | PRN
  Administered 2024-03-28 – 2024-04-03 (×18): 15 mg via ORAL
  Filled 2024-03-28 (×18): qty 3

## 2024-03-28 MED ORDER — PERFLUTREN LIPID MICROSPHERE
1.0000 mL | INTRAVENOUS | Status: AC | PRN
  Administered 2024-03-28: 10 mL via INTRAVENOUS

## 2024-03-28 MED ORDER — FUROSEMIDE 10 MG/ML IJ SOLN
20.0000 mg | Freq: Two times a day (BID) | INTRAMUSCULAR | Status: AC
  Administered 2024-03-28 (×2): 20 mg via INTRAVENOUS
  Filled 2024-03-28 (×2): qty 2

## 2024-03-28 MED ORDER — INSULIN ASPART 100 UNIT/ML IJ SOLN
0.0000 [IU] | Freq: Every day | INTRAMUSCULAR | Status: DC
  Administered 2024-03-28 – 2024-03-29 (×2): 5 [IU] via SUBCUTANEOUS

## 2024-03-28 MED ORDER — OXYCODONE HCL 5 MG PO TABS: 5.0000 mg | ORAL_TABLET | Freq: Four times a day (QID) | ORAL | Status: DC | PRN

## 2024-03-28 MED ORDER — RANOLAZINE ER 500 MG PO TB12
500.0000 mg | ORAL_TABLET | Freq: Two times a day (BID) | ORAL | Status: DC
  Administered 2024-03-28 – 2024-04-06 (×19): 500 mg via ORAL
  Filled 2024-03-28 (×19): qty 1

## 2024-03-28 MED ORDER — ESCITALOPRAM OXALATE 10 MG PO TABS
10.0000 mg | ORAL_TABLET | Freq: Every day | ORAL | Status: DC
  Administered 2024-03-28 – 2024-04-06 (×10): 10 mg via ORAL
  Filled 2024-03-28 (×10): qty 1

## 2024-03-28 MED ORDER — POLYETHYLENE GLYCOL 3350 17 G PO PACK: 17.0000 g | PACK | Freq: Every day | ORAL | Status: DC | PRN

## 2024-03-28 MED ORDER — FUROSEMIDE 10 MG/ML IJ SOLN
40.0000 mg | Freq: Every day | INTRAMUSCULAR | Status: DC
  Administered 2024-03-29: 40 mg via INTRAVENOUS
  Filled 2024-03-28: qty 4

## 2024-03-28 MED ORDER — PREGABALIN 100 MG PO CAPS
100.0000 mg | ORAL_CAPSULE | Freq: Three times a day (TID) | ORAL | Status: DC
  Administered 2024-03-28 – 2024-03-31 (×10): 100 mg via ORAL
  Filled 2024-03-28 (×11): qty 1

## 2024-03-28 MED ORDER — ALPRAZOLAM 0.5 MG PO TABS
0.5000 mg | ORAL_TABLET | Freq: Two times a day (BID) | ORAL | Status: AC | PRN
  Administered 2024-03-28 – 2024-03-29 (×2): 0.5 mg via ORAL
  Filled 2024-03-28: qty 2
  Filled 2024-03-28: qty 1
  Filled 2024-03-28: qty 2

## 2024-03-28 MED ORDER — PREDNISOLONE ACETATE 1 % OP SUSP
1.0000 [drp] | Freq: Four times a day (QID) | OPHTHALMIC | Status: DC
  Administered 2024-03-28 – 2024-04-06 (×35): 1 [drp] via OPHTHALMIC
  Filled 2024-03-28 (×2): qty 5

## 2024-03-28 MED ORDER — CARVEDILOL 6.25 MG PO TABS
6.2500 mg | ORAL_TABLET | Freq: Two times a day (BID) | ORAL | Status: DC
  Administered 2024-03-29 – 2024-04-02 (×10): 6.25 mg via ORAL
  Filled 2024-03-28 (×2): qty 1
  Filled 2024-03-28: qty 2
  Filled 2024-03-28 (×8): qty 1

## 2024-03-28 MED ORDER — PANTOPRAZOLE SODIUM 40 MG PO TBEC
40.0000 mg | DELAYED_RELEASE_TABLET | Freq: Every day | ORAL | Status: DC
  Administered 2024-03-28 – 2024-04-06 (×10): 40 mg via ORAL
  Filled 2024-03-28 (×10): qty 1

## 2024-03-28 MED ORDER — ASPIRIN 81 MG PO TBEC
81.0000 mg | DELAYED_RELEASE_TABLET | Freq: Every day | ORAL | Status: DC
Start: 2024-03-28 — End: 2024-04-06
  Administered 2024-03-28 – 2024-04-06 (×10): 81 mg via ORAL
  Filled 2024-03-28 (×10): qty 1

## 2024-03-28 MED ORDER — ATORVASTATIN CALCIUM 80 MG PO TABS
80.0000 mg | ORAL_TABLET | Freq: Every day | ORAL | Status: DC
  Administered 2024-03-28 – 2024-04-06 (×10): 80 mg via ORAL
  Filled 2024-03-28 (×10): qty 1

## 2024-03-28 MED ORDER — PROCHLORPERAZINE EDISYLATE 10 MG/2ML IJ SOLN
5.0000 mg | Freq: Four times a day (QID) | INTRAMUSCULAR | Status: DC | PRN
  Administered 2024-04-02: 5 mg via INTRAVENOUS
  Filled 2024-03-28: qty 2

## 2024-03-28 MED ORDER — TAMSULOSIN HCL 0.4 MG PO CAPS
0.4000 mg | ORAL_CAPSULE | Freq: Every day | ORAL | Status: DC
  Administered 2024-03-28 – 2024-04-02 (×6): 0.4 mg via ORAL
  Filled 2024-03-28 (×6): qty 1

## 2024-03-28 NOTE — Progress Notes (Signed)
 Office Visit Note   Patient: Joseph [name]           Date of Birth: [birth year]           MRN: [id number] Visit Date: 03/27/2024              Requested by: Cain Saupe, MD [address], [address] [place],  [name] [address] PCP: Cain Saupe, MD  Chief Complaint  Patient presents with   Left Leg - Follow-up    HX left BKA 12/2021      HPI: [name] who is seen for evaluation for bilateral transtibial amputations.  Patient is currently on doxycycline.  Patient states his legs are tender anteriorly over the distal tibia.  Assessment & Plan: Visit Diagnoses:  1. Below-knee amputation of left lower extremity, initial encounter (HCC)   2. Below-knee amputation of right lower extremity, initial encounter (HCC)     Plan: Cellulitis is improving.  A new prescription for doxycycline is called in.  Continue with current wound care hold on use of prosthesis.  Follow-Up Instructions: Return in about 1 week (around 04/03/2024).   Ortho Exam  Patient is alert, oriented, no adenopathy, well-dressed, normal affect, normal respiratory effort. Examination the cellulitis over the residual limb is improving there is no tenderness to palpation.  There is a small draining hematoma posterior medial residual limb from impingement from the socket that is 3 mm in diameter there is no abscess there is dark bloody drainage.  Imaging: DG Chest Port 1 View Result Date: 03/28/2024 CLINICAL DATA:  SOB, CHF EXAM: PORTABLE CHEST 1 VIEW COMPARISON:  Chest x-ray 01/07/2024 FINDINGS: The heart and mediastinal contours are unchanged. Surgical changes overlie the mediastinum. No focal consolidation. No pulmonary edema. No pleural effusion. No pneumothorax. No acute osseous abnormality.  Severe anatomy wires are intact. IMPRESSION: No active disease. Electronically Signed   By: Tish Frederickson M.D.   On: 03/28/2024 00:44   No images are attached to the encounter.  Labs: Lab Results   Component Value Date   HGBA1C 8.1 (H) 01/07/2024   HGBA1C 8.0 (H) 11/27/2023   HGBA1C 7.4 (H) 04/26/2023   ESRSEDRATE 62 (H) 07/01/2022   ESRSEDRATE 45 (H) 11/18/2021   ESRSEDRATE 41 (H) 09/16/2017   CRP 12.0 (H) 07/01/2022   CRP 12.1 (H) 11/18/2021   CRP 8.9 (H) 09/16/2017   REPTSTATUS 01/13/2024 FINAL 01/08/2024   CULT  01/08/2024    NO GROWTH 5 DAYS Performed at [facility], [address] [place], [name] [address]    LABORGA KLEBSIELLA OXYTOCA (A) 11/26/2023     Lab Results  Component Value Date   ALBUMIN 1.7 (L) 01/09/2024   ALBUMIN 1.6 (L) 01/08/2024   ALBUMIN 3.1 (L) 11/26/2023   PREALBUMIN 16.5 (L) 01/07/2022    Lab Results  Component Value Date   MG 1.3 (L) 03/28/2024   MG 1.7 01/08/2024   MG 1.9 12/03/2023   Lab Results  Component Value Date   VD25OH 9.40 (L) 01/07/2022    Lab Results  Component Value Date   PREALBUMIN 16.5 (L) 01/07/2022      Latest Ref Rng & Units 03/28/2024    5:00 AM 03/27/2024   10:10 PM 01/16/2024    7:13 AM  CBC EXTENDED  WBC 4.0 - 10.5 K/uL 7.7  7.8  15.9   RBC 4.22 - 5.81 MIL/uL 4.60  4.89  3.89   Hemoglobin 13.0 - 17.0 g/dL 86.5  78.4  69.6  HCT 39.0 - 52.0 % 38.3  40.4  32.6   Platelets 150 - 400 K/uL 225  267  406   NEUT# 1.7 - 7.7 K/uL  4.1    Lymph# 0.7 - 4.0 K/uL  2.6       There is no height or weight on file to calculate BMI.  Orders:  No orders of the defined types were placed in this encounter.  No orders of the defined types were placed in this encounter.    Procedures: No procedures performed  Clinical Data: No additional findings.  ROS:  All other systems negative, except as noted in the HPI. Review of Systems  Objective: Vital Signs: There were no vitals taken for this visit.  Specialty Comments:  No specialty comments available.  PMFS History: Patient Active Problem List   Diagnosis Date Noted   Acute kidney failure (HCC) 01/09/2024   Acute urinary retention 01/08/2024    S/P bilateral BKA (below knee amputation) (HCC) 01/07/2024   Morbid obesity with BMI of 70 and over, adult (HCC) 11/28/2023   Aortic atherosclerosis (HCC) 11/28/2023   CKD (chronic kidney disease), stage III (HCC) 11/28/2023   Weakness 04/28/2023   Nephrosis 04/27/2023   OSA (obstructive sleep apnea) 04/26/2023   Hx of iron deficiency anemia 07/07/2022   Below-knee amputation of left lower extremity (HCC)    Bipolar disorder with depression (HCC)    Dyspnea 11/18/2021   Bicuspid aortic valve 09/14/2021   Aneurysm of ascending aorta (HCC) 09/14/2021   DM (diabetes mellitus) with peripheral vascular complication (HCC) 08/20/2021   Essential hypertension 07/17/2021   Cellulitis of left lower leg 04/11/2021   Heart failure with preserved ejection fraction (HCC) 07/22/2020   Acute renal failure superimposed on stage 3a chronic kidney disease (HCC) 07/18/2020   Leukocytosis 05/31/2019   Smoker 03/08/2019   Chronic back pain 02/16/2019   BMI 70 and over, adult (HCC) 02/16/2019   Peripheral nerve disease 02/16/2019   COPD (chronic obstructive pulmonary disease) (HCC) 01/22/2019   Anxiety and depression 03/26/2018   Recurrent chest pain 07/29/2016   Hyperglycemia    Insulin dependent type 2 diabetes mellitus (HCC) 04/29/2015   History of pulmonary embolus (PE) 04/27/2013   CAD (coronary artery disease) 09/10/2008   Past Medical History:  Diagnosis Date   Aneurysm of ascending aorta (HCC) 09/14/2021   Anxiety    Aortic atherosclerosis (HCC) 11/28/2023   Arthritis    "knees, shoulders, hips, ankles" (07/29/2016)   Asthma    Bicuspid aortic valve 09/14/2021   BMI 70 and over, adult (HCC) 02/16/2019   CAD (coronary artery disease)    a. 2017: s/p BMS to distal Cx; b. LHC 05/30/2019: 80% mid, distal RCA s/p DES, 30% narrowing of d LM, widely patent LAD w/ luminal irregularities, widely patent stent in dCX  w/ 90+% stenosis distal to stent beofre small trifurcating obtuse marginal  (potentially area of restenosis)   Chronic bronchitis (HCC)    Chronic ulcer of great toe of right foot (HCC) 09/09/2017   Chronic ulcer of right great toe (HCC) 09/19/2017   COPD (chronic obstructive pulmonary disease) (HCC)    Coronary arteriosclerosis    Deep venous thrombosis (HCC) 02/16/2019   Depression    Diabetes mellitus (HCC)    DVT (deep venous thrombosis) (HCC)    GERD (gastroesophageal reflux disease)    Hyperlipidemia    Hypertension    Migraine    Morbid obesity (HCC)    Morbid obesity with BMI of 70 and over,  adult [facility]) 11/28/2023   Myocardial infarction [facility]) 1996   "light one"   Non-ST elevation (NSTEMI) myocardial infarction (HCC) 07/22/2020   PE (pulmonary embolism) 04/2013   On chronic Xarelto   Peripheral nerve disease    Pneumonia "several times"   Sleep apnea    Type II diabetes mellitus (HCC)     Family History  Problem Relation Age of Onset   Hypertension Brother    Hypertension Father    Diabetes Other    Hyperlipidemia Other     Past Surgical History:  Procedure Laterality Date   ABDOMINAL AORTOGRAM W/LOWER EXTREMITY N/A 10/10/2020   Procedure: ABDOMINAL AORTOGRAM W/LOWER EXTREMITY;  Surgeon: Sherren Kerns, MD;  Location: [facility];  Service: Cardiovascular;  Laterality: N/A;   AMPUTATION Right 09/21/2017   Procedure: RIGHT GREAT TOE AMPUTATION, POSSIBLE VAC;  Surgeon: Tarry Kos, MD;  Location: [facility] OR;  Service: Orthopedics;  Laterality: Right;   AMPUTATION Left 08/13/2020   Procedure: LEFT FOOT 4TH RAY AMPUTATION;  Surgeon: [name], MD;  Location: [facility] OR;  Service: Orthopedics;  Laterality: Left;   AMPUTATION Left 11/20/2021   Procedure: LEFT TRANSMETATARSAL AMPUTATION;  Surgeon: [name], MD;  Location: [facility] OR;  Service: Orthopedics;  Laterality: Left;   AMPUTATION Left 01/08/2022   Procedure: LEFT BELOW KNEE AMPUTATION;  Surgeon: [name], MD;  Location: [facility] OR;  Service: Orthopedics;  Laterality: Left;    AMPUTATION Right 07/07/2022   Procedure: RIGHT BELOW KNEE AMPUTATION;  Surgeon: [name], MD;  Location: [facility] OR;  Service: Orthopedics;  Laterality: Right;   AORTOGRAM Bilateral 03/13/2021   Procedure: ABDOMINAL AORTOGRAM WITH Left LOWER EXTREMITY RUNOFF;  Surgeon: Leonie Douglas, MD;  Location: [facility] OR;  Service: Vascular;  Laterality: Bilateral;   APPLICATION OF WOUND VAC  01/08/2022   Procedure: APPLICATION OF WOUND VAC;  Surgeon: [name], MD;  Location: [facility] OR;  Service: Orthopedics;;   APPLICATION OF WOUND VAC Right 07/07/2022   Procedure: APPLICATION OF WOUND VAC;  Surgeon: [name], MD;  Location: [facility] OR;  Service: Orthopedics;  Laterality: Right;   CARDIAC CATHETERIZATION  2006   CARDIAC CATHETERIZATION  1996   "@ [facility]; when I had my heart attack"   CARDIAC CATHETERIZATION N/A 07/29/2016   Procedure: Left Heart Cath and Coronary Angiography;  Surgeon: Runell Gess, MD;  Location: [facility] INVASIVE CV LAB;  Service: Cardiovascular;  Laterality: N/A;   CARDIAC CATHETERIZATION N/A 07/29/2016   Procedure: Coronary Stent Intervention;  Surgeon: Runell Gess, MD;  Location: [facility];  Service: Cardiovascular;  Laterality: N/A;   CARPAL TUNNEL RELEASE Bilateral    CORONARY ANGIOPLASTY WITH STENT PLACEMENT  07/29/2016   CORONARY ARTERY BYPASS GRAFT N/A 07/24/2020   Procedure: CORONARY ARTERY BYPASS GRAFTING (CABG), ON PUMP, TIMES FOUR, USING LEFT INTERNAL MAMMARY ARTERY AND ENDOSCOPICALLY HARVESTED RIGHT GREATER SAPHENOUS VEIN;  Surgeon: Corliss Skains, MD;  Location: [facility] OR;  Service: Open Heart Surgery;  Laterality: N/A;  FLOW TAC   CORONARY PRESSURE/FFR STUDY N/A 07/22/2020   Procedure: INTRAVASCULAR PRESSURE WIRE/FFR STUDY;  Surgeon: Marykay Lex, MD;  Location: [facility] INVASIVE CV LAB;  Service: Cardiovascular;  Laterality: N/A;   CORONARY STENT INTERVENTION N/A 05/30/2019   Procedure: CORONARY STENT INTERVENTION;  Surgeon: Lyn Records, MD;  Location: [facility];  Service: Cardiovascular;  Laterality: N/A;   CORONARY STENT INTERVENTION N/A 11/02/2019   Procedure: CORONARY STENT INTERVENTION;  Surgeon: Iran Ouch,  MD;  Location: [facility];  Service: Cardiovascular;  Laterality: N/A;   ESOPHAGOGASTRODUODENOSCOPY N/A 09/22/2017   Procedure: ESOPHAGOGASTRODUODENOSCOPY (EGD);  Surgeon: Meryl Dare, MD;  Location: [facility] ENDOSCOPY;  Service: Endoscopy;  Laterality: N/A;   KNEE ARTHROSCOPY Bilateral    "2 on left; 1 on the right"   LEFT HEART CATH AND CORONARY ANGIOGRAPHY N/A 11/02/2019   Procedure: LEFT HEART CATH AND CORONARY ANGIOGRAPHY;  Surgeon: Iran Ouch, MD;  Location: [facility];  Service: Cardiovascular;  Laterality: N/A;   LEFT HEART CATH AND CORONARY ANGIOGRAPHY N/A 07/22/2020   Procedure: LEFT HEART CATH AND CORONARY ANGIOGRAPHY;  Surgeon: Marykay Lex, MD;  Location: [facility] INVASIVE CV LAB;  Service: Cardiovascular;  Laterality: N/A;   LEFT HEART CATH AND CORS/GRAFTS ANGIOGRAPHY N/A 08/17/2021   Procedure: LEFT HEART CATH AND CORS/GRAFTS ANGIOGRAPHY;  Surgeon: Yvonne Kendall, MD;  Location: [facility];  Service: Cardiovascular;  Laterality: N/A;   PERIPHERAL VASCULAR INTERVENTION Left 10/11/2020   popliteal and SFA stent placement    PERIPHERAL VASCULAR INTERVENTION Left 10/10/2020   Procedure: PERIPHERAL VASCULAR INTERVENTION;  Surgeon: Sherren Kerns, MD;  Location: [facility];  Service: Cardiovascular;  Laterality: Left;   RIGHT/LEFT HEART CATH AND CORONARY ANGIOGRAPHY N/A 05/30/2019   Procedure: RIGHT/LEFT HEART CATH AND CORONARY ANGIOGRAPHY;  Surgeon: Lyn Records, MD;  Location: [facility];  Service: Cardiovascular;  Laterality: N/A;   RIGHT/LEFT HEART CATH AND CORONARY/GRAFT ANGIOGRAPHY N/A 06/17/2022   Procedure: RIGHT/LEFT HEART CATH AND CORONARY/GRAFT ANGIOGRAPHY;  Surgeon: Lennette Bihari, MD;  Location: [facility];  Service: Cardiovascular;  Laterality: N/A;    SHOULDER OPEN ROTATOR CUFF REPAIR Bilateral    TEE WITHOUT CARDIOVERSION N/A 07/24/2020   Procedure: TRANSESOPHAGEAL ECHOCARDIOGRAM (TEE);  Surgeon: Corliss Skains, MD;  Location: [facility] OR;  Service: Open Heart Surgery;  Laterality: N/A;   Social History   Occupational History   Occupation: disabled     Associate Professor: UNEMPLOYED  Tobacco Use   Smoking status: Former    Current packs/day: 0.00    Average packs/day: 1 pack/day for 34.0 years (34.0 ttl pk-yrs)    Types: Cigarettes    Start date: 07/12/1986    Quit date: 07/12/2020    Years since quitting: 3.7   Smokeless tobacco: Former    Types: Snuff, Chew  Vaping Use   Vaping status: Never Used  Substance and Sexual Activity   Alcohol use: Yes    Comment: RARE   Drug use: No   Sexual activity: Yes

## 2024-03-28 NOTE — H&P (Signed)
 History and Physical  Joseph [name] ZOX:[id number] DOB: 1971/09/25 DOA: 03/27/2024  Referring physician: Sharilyn Sites, PA-EDP  PCP: Cain Saupe, MD  Outpatient Specialists: Cardiology, orthopedic surgery. Patient coming from: Home.  Chief Complaint: Shortness of breath  HPI: Joseph [name] is a 53 y.o. male with medical history significant for severe morbid obesity, bilateral below the knee amputation, type 2 diabetes, hypertension, chronic HFpEF, COPD, CKD 3B, who presents to the ER at the recommendation of his PCP due to abnormal lab results.  The patient went to see his primary care provider today due to progressively worsening shortness of breath.  Associated with bilateral lower extremity edema and orthopnea.  After he returned home he was called and told that his BNP was elevated.  He was advised to go to the ER for further evaluation for BNP of 1700.  In the ER, tachycardic.  Not hypoxic with O2 saturation of 95% on room air.  Volume overload on exam with bilateral lower extremity edema.  BNP 90.  Chest x-ray no active disease.  While in the ER the patient complained of having chest pain while at rest.  Left-sided sharp pain and non-radiating.  High-sensitivity troponin peaked at 37 and downtrended.  No evidence of acute ischemia on twelve-lead EKG.    In the ER, the patient received IV Lasix 40 mg x 1.  TRH, hospitalist service, was asked to admit for further diuresing.  ED Course: Temperature 98.4.  BP 136/78, pulse 88, respiration rate 13, O2 saturation 95% on room air.  CBC is unremarkable.  Troponin 37, 33.  Serum glucose 199, BUN 27, creatinine 1.60.  GFR 49.  Review of Systems: Review of systems as noted in the HPI. All other systems reviewed and are negative.   Past Medical History:  Diagnosis Date   Aneurysm of ascending aorta (HCC) 09/14/2021   Anxiety    Aortic atherosclerosis (HCC) 11/28/2023   Arthritis    "knees, shoulders, hips, ankles" (07/29/2016)   Asthma     Bicuspid aortic valve 09/14/2021   BMI 70 and over, adult (HCC) 02/16/2019   CAD (coronary artery disease)    a. 2017: s/p BMS to distal Cx; b. LHC 05/30/2019: 80% mid, distal RCA s/p DES, 30% narrowing of d LM, widely patent LAD w/ luminal irregularities, widely patent stent in dCX  w/ 90+% stenosis distal to stent beofre small trifurcating obtuse marginal (potentially area of restenosis)   Chronic bronchitis (HCC)    Chronic ulcer of great toe of right foot (HCC) 09/09/2017   Chronic ulcer of right great toe (HCC) 09/19/2017   COPD (chronic obstructive pulmonary disease) (HCC)    Coronary arteriosclerosis    Deep venous thrombosis (HCC) 02/16/2019   Depression    Diabetes mellitus (HCC)    DVT (deep venous thrombosis) (HCC)    GERD (gastroesophageal reflux disease)    Hyperlipidemia    Hypertension    Migraine    Morbid obesity (HCC)    Morbid obesity with BMI of 70 and over, adult (HCC) 11/28/2023   Myocardial infarction (HCC) 1996   "light one"   Non-ST elevation (NSTEMI) myocardial infarction (HCC) 07/22/2020   PE (pulmonary embolism) 04/2013   On chronic Xarelto   Peripheral nerve disease    Pneumonia "several times"   Sleep apnea    Type II diabetes mellitus (HCC)    Past Surgical History:  Procedure Laterality Date   ABDOMINAL AORTOGRAM W/LOWER EXTREMITY N/A 10/10/2020   Procedure: ABDOMINAL AORTOGRAM W/LOWER EXTREMITY;  Surgeon:  Sherren Kerns, MD;  [facility]: [facility];  Service: Cardiovascular;  Laterality: N/A;   AMPUTATION Right 09/21/2017   Procedure: RIGHT GREAT TOE AMPUTATION, POSSIBLE VAC;  Surgeon: Tarry Kos, MD;  Location: [facility] OR;  Service: Orthopedics;  Laterality: Right;   AMPUTATION Left 08/13/2020   Procedure: LEFT FOOT 4TH RAY AMPUTATION;  Surgeon: [name], MD;  Location[facility] OR;  Service: Orthopedics;  Laterality: Left;   AMPUTATION Left 11/20/2021   Procedure: LEFT TRANSMETATARSAL AMPUTATION;  Surgeon: [name], MD;  Location: [facility]  OR;  Service: Orthopedics;  Laterality: Left;   AMPUTATION Left 01/08/2022   Procedure: LEFT BELOW KNEE AMPUTATION;  Surgeon: [name], MD;  Location: [facility] OR;  Service: Orthopedics;  Laterality: Left;   AMPUTATION Right 07/07/2022   Procedure: RIGHT BELOW KNEE AMPUTATION;  Surgeon: [name], MD;  Location: [facility] OR;  Service: Orthopedics;  Laterality: Right;   AORTOGRAM Bilateral 03/13/2021   Procedure: ABDOMINAL AORTOGRAM WITH Left LOWER EXTREMITY RUNOFF;  Surgeon: Leonie Douglas, MD;  Location: [facility] OR;  Service: Vascular;  Laterality: Bilateral;   APPLICATION OF WOUND VAC  01/08/2022   Procedure: APPLICATION OF WOUND VAC;  Surgeon: [name], MD;  Location: [facility] OR;  Service: Orthopedics;;   APPLICATION OF WOUND VAC Right 07/07/2022   Procedure: APPLICATION OF WOUND VAC;  Surgeon: [name], MD;  [facility]: [facility] OR;  Service: Orthopedics;  Laterality: Right;   CARDIAC CATHETERIZATION  2006   CARDIAC CATHETERIZATION  1996   "@ [facility]; when I had my heart attack"   CARDIAC CATHETERIZATION N/A 07/29/2016   Procedure: Left Heart Cath and Coronary Angiography;  Surgeon: Runell Gess, MD;  Location: [facility] INVASIVE CV LAB;  Service: Cardiovascular;  Laterality: N/A;   CARDIAC CATHETERIZATION N/A 07/29/2016   Procedure: Coronary Stent Intervention;  Surgeon: Runell Gess, MD;  [facility]: [facility];  Service: Cardiovascular;  Laterality: N/A;   CARPAL TUNNEL RELEASE Bilateral    CORONARY ANGIOPLASTY WITH STENT PLACEMENT  07/29/2016   CORONARY ARTERY BYPASS GRAFT N/A 07/24/2020   Procedure: CORONARY ARTERY BYPASS GRAFTING (CABG), ON PUMP, TIMES FOUR, USING LEFT INTERNAL MAMMARY ARTERY AND ENDOSCOPICALLY HARVESTED RIGHT GREATER SAPHENOUS VEIN;  Surgeon: Corliss Skains, MD;  Location: [facility] OR;  Service: Open Heart Surgery;  Laterality: N/A;  FLOW TAC   CORONARY PRESSURE/FFR STUDY N/A 07/22/2020   Procedure: INTRAVASCULAR PRESSURE WIRE/FFR STUDY;  Surgeon: Marykay Lex, MD;   Location: [facility] INVASIVE CV LAB;  Service: Cardiovascular;  Laterality: N/A;   CORONARY STENT INTERVENTION N/A 05/30/2019   Procedure: CORONARY STENT INTERVENTION;  Surgeon: Lyn Records, MD;  [facility]: [facility];  Service: Cardiovascular;  Laterality: N/A;   CORONARY STENT INTERVENTION N/A 11/02/2019   Procedure: CORONARY STENT INTERVENTION;  Surgeon: Iran Ouch, MD;  [facility]: [facility];  Service: Cardiovascular;  Laterality: N/A;   ESOPHAGOGASTRODUODENOSCOPY N/A 09/22/2017   Procedure: ESOPHAGOGASTRODUODENOSCOPY (EGD);  Surgeon: Meryl Dare, MD;  Location: [facility] ENDOSCOPY;  Service: Endoscopy;  Laterality: N/A;   KNEE ARTHROSCOPY Bilateral    "2 on left; 1 on the right"   LEFT HEART CATH AND CORONARY ANGIOGRAPHY N/A 11/02/2019   Procedure: LEFT HEART CATH AND CORONARY ANGIOGRAPHY;  Surgeon: Iran Ouch, MD;  [facility]: [facility];  Service: Cardiovascular;  Laterality: N/A;   LEFT HEART CATH AND CORONARY ANGIOGRAPHY N/A 07/22/2020   Procedure: LEFT HEART CATH AND CORONARY ANGIOGRAPHY;  Surgeon: Marykay Lex,  MD;  [facility]: [facility];  Service: Cardiovascular;  Laterality: N/A;   LEFT HEART CATH AND CORS/GRAFTS ANGIOGRAPHY N/A 08/17/2021   Procedure: LEFT HEART CATH AND CORS/GRAFTS ANGIOGRAPHY;  Surgeon: Yvonne Kendall, MD;  [facility]: [facility];  Service: Cardiovascular;  Laterality: N/A;   PERIPHERAL VASCULAR INTERVENTION Left 10/11/2020   popliteal and SFA stent placement    PERIPHERAL VASCULAR INTERVENTION Left 10/10/2020   Procedure: PERIPHERAL VASCULAR INTERVENTION;  Surgeon: Sherren Kerns, MD;  [facility]: [facility];  Service: Cardiovascular;  Laterality: Left;   RIGHT/LEFT HEART CATH AND CORONARY ANGIOGRAPHY N/A 05/30/2019   Procedure: RIGHT/LEFT HEART CATH AND CORONARY ANGIOGRAPHY;  Surgeon: Lyn Records, MD;  [facility]: [facility];  Service: Cardiovascular;  Laterality: N/A;   RIGHT/LEFT HEART CATH AND  CORONARY/GRAFT ANGIOGRAPHY N/A 06/17/2022   Procedure: RIGHT/LEFT HEART CATH AND CORONARY/GRAFT ANGIOGRAPHY;  Surgeon: Lennette Bihari, MD;  [facility]: [facility];  Service: Cardiovascular;  Laterality: N/A;   SHOULDER OPEN ROTATOR CUFF REPAIR Bilateral    TEE WITHOUT CARDIOVERSION N/A 07/24/2020   Procedure: TRANSESOPHAGEAL ECHOCARDIOGRAM (TEE);  Surgeon: Corliss Skains, MD;  Location: [facility] OR;  Service: Open Heart Surgery;  Laterality: N/A;    Social History:  reports that he quit smoking about 3 years ago. His smoking use included cigarettes. He started smoking about 37 years ago. He has a 34 pack-year smoking history. He has quit using smokeless tobacco.  His smokeless tobacco use included snuff and chew. He reports current alcohol use. He reports that he does not use drugs.   Allergies  Allergen Reactions   Duloxetine Other (See Comments)   Sulfa Antibiotics Other (See Comments)    Headaches     Family History  Problem Relation Age of Onset   Hypertension Brother    Hypertension Father    Diabetes Other    Hyperlipidemia Other       Prior to Admission medications   Medication Sig Start Date End Date Taking? Authorizing Provider  FARXIGA 10 MG TABS tablet Take 10 mg by mouth daily. 03/23/24  Yes [provider]  meloxicam (MOBIC) 15 MG tablet Take 15 mg by mouth daily. 03/23/24  Yes [provider]  naloxone (NARCAN) nasal spray 4 mg/0.1 mL Place 4 mg into the nose as needed (overdose).   Yes [provider]  nitroGLYCERIN (NITROSTAT) 0.4 MG SL tablet Place 1 tablet (0.4 mg total) under the tongue every 5 (five) minutes as needed. 10/08/22  Yes Gaston Islam., NP  prednisoLONE acetate (PRED FORTE) 1 % ophthalmic suspension Place 1 drop into the left eye 4 (four) times daily. 02/19/24  Yes [provider]  pregabalin (LYRICA) 100 MG capsule Take 100 mg by mouth 3 (three) times daily. 02/24/24  Yes [provider]  acetaminophen  (TYLENOL) 500 MG tablet Take 1,000 mg by mouth 3 (three) times daily as needed for headache.    [provider]  albuterol (VENTOLIN HFA) 108 (90 Base) MCG/ACT inhaler Inhale 2 puffs into the lungs every 6 (six) hours as needed for shortness of breath.    [provider]  ALPRAZolam Prudy Feeler) 1 MG tablet Take 1 tablet (1 mg total) by mouth 2 (two) times daily as needed. Patient taking differently: Take 1 mg by mouth at bedtime. May take an additional tablet twice a day as needed for anxiety 03/26/24  Yes   aspirin EC 81 MG tablet Take 81 mg by mouth in the morning.  Swallow whole.    [provider]  atorvastatin (LIPITOR) 80 MG tablet TAKE ONE TABLET (80 MG TOTAL) BY MOUTH DAILY AT 6PM (APPOINTMENT OVERDUE: NEEDS APPOINTMENT FOR FURTHER REFILLS PLEASE CALL OFFICE) 02/07/24   Tereso Newcomer T, PA-C  buPROPion (WELLBUTRIN XL) 150 MG 24 hr tablet Take 150 mg by mouth in the morning and at bedtime.    [provider]  carvedilol (COREG) 6.25 MG tablet TAKE ONE TABLET (6.25 MG TOTAL) BY MOUTH TWICE DAILY @ 9AM & 5PM 01/26/24   Chilton Si, MD  clopidogrel (PLAVIX) 75 MG tablet Take 1 tablet (75 mg total) by mouth daily. 03/21/24   Chilton Si, MD  docusate sodium (COLACE) 100 MG capsule Take by mouth daily. Patient not taking: Reported on 03/28/2024 02/27/24   [provider]  doxycycline (VIBRA-TABS) 100 MG tablet Take 1 tablet (100 mg total) by mouth 2 (two) times daily. 03/23/24   [name], MD  escitalopram (LEXAPRO) 10 MG tablet Take 10 mg by mouth daily. 01/27/22   [provider]  fluconazole (DIFLUCAN) 100 MG tablet Take 100 mg by mouth every Saturday.    [provider]  fluticasone (FLONASE) 50 MCG/ACT nasal spray Place 2 sprays into both nostrils daily.    [provider]  furosemide (LASIX) 40 MG tablet Take 1 tablet (40 mg total) by mouth daily and as directed for 90 days. 03/26/24     icosapent Ethyl (VASCEPA) 1 g capsule  TAKE TWO CAPSULES (2 G TOTAL) BY MOUTH TWICE DAILY APPOINTMENT OVERDUE: NEEDS APPOINTMENT FOR FURTHER REFILLS PLEASE CALL OFFICE 01/30/24   Tereso Newcomer T, PA-C  insulin aspart (NOVOLOG) 100 UNIT/ML injection Inject 8 Units into the skin 3 (three) times daily with meals. Hold if eats less than 50% of meals 01/14/24   Burnadette Pop, MD  insulin glargine (LANTUS SOLOSTAR) 100 UNIT/ML Solostar Pen Inject 38 Units into the skin 2 (two) times daily. 01/14/24   Burnadette Pop, MD  ketotifen (ZADITOR) 0.035 % ophthalmic solution Place 1 drop into the left eye 2 (two) times daily. 12/03/23   Rolly Salter, MD  lidocaine (LIDODERM) 5 % Place 1 patch onto the skin daily.    [provider]  loratadine (CLARITIN) 10 MG tablet Take 10 mg by mouth daily. 09/27/22   [provider]  oxyCODONE (ROXICODONE) 15 MG immediate release tablet Take 1 tablet (15 mg total) by mouth every 4 (four) - 6 (six) hours for 30 days. Patient not taking: Reported on 03/28/2024 02/23/24     pantoprazole (PROTONIX) 40 MG tablet Take 1 tablet (40 mg total) by mouth daily. 03/26/24     promethazine (PHENERGAN) 25 MG tablet Take 25 mg by mouth every 4 (four) hours as needed for nausea or vomiting. 09/10/22   [provider]  ranolazine (RANEXA) 500 MG 12 hr tablet Take 1 tablet (500 mg total) by mouth 2 (two) times daily. 03/26/24     tamsulosin (FLOMAX) 0.4 MG CAPS capsule Take 1 capsule (0.4 mg total) by mouth daily. 01/14/24   Burnadette Pop, MD  tiZANidine (ZANAFLEX) 4 MG tablet Take 4 mg by mouth 3 (three) times daily. 11/25/23   [provider]    Physical Exam: BP 122/75   Pulse 91   Temp 98.4 F (36.9 C) (Oral)   Resp 14   Wt (!) 172.4 kg   SpO2 98%   BMI 71.80 kg/m   General: 53 y.o. year-old male well developed well nourished in no acute  distress.  Alert and oriented x3. Cardiovascular: Regular rate and rhythm with no rubs or gallops.  No thyromegaly or JVD noted.  Bilateral BKA, 1+ pitting  edema in lower extremities bilaterally. Respiratory: Clear to auscultation with no wheezes or rales. Good inspiratory effort. Abdomen: Soft nontender nondistended with normal bowel sounds x4 quadrants. Muskuloskeletal: No cyanosis or clubbing noted bilaterally.  Bilateral BKA. Neuro: CN II-XII intact, strength, sensation, reflexes Skin: No ulcerative lesions noted or rashes Psychiatry: Judgement and insight appear normal. Mood is appropriate for condition and setting          Labs on Admission:  Basic Metabolic Panel: Recent Labs  Lab 03/27/24 2210  NA 139  K 4.3  CL 104  CO2 27  GLUCOSE 199*  BUN 27*  CREATININE 1.68*  CALCIUM 9.1   Liver Function Tests: No results for input(s): "AST", "ALT", "ALKPHOS", "BILITOT", "PROT", "ALBUMIN" in the last 168 hours. No results for input(s): "LIPASE", "AMYLASE" in the last 168 hours. No results for input(s): "AMMONIA" in the last 168 hours. CBC: Recent Labs  Lab 03/27/24 2210  WBC 7.8  NEUTROABS 4.1  HGB 13.0  HCT 40.4  MCV 82.6  PLT 267   Cardiac Enzymes: No results for input(s): "CKTOTAL", "CKMB", "CKMBINDEX", "TROPONINI" in the last 168 hours.  BNP (last 3 results) Recent Labs    04/26/23 1214 01/07/24 0045 03/27/24 2210  BNP 137.3* 347.2* 90.4    ProBNP (last 3 results) Recent Labs    04/25/23 1520  PROBNP 375*    CBG: No results for input(s): "GLUCAP" in the last 168 hours.  Radiological Exams on Admission: DG Chest Port 1 View Result Date: 03/28/2024 CLINICAL DATA:  SOB, CHF EXAM: PORTABLE CHEST 1 VIEW COMPARISON:  Chest x-ray 01/07/2024 FINDINGS: The heart and mediastinal contours are unchanged. Surgical changes overlie the mediastinum. No focal consolidation. No pulmonary edema. No pleural effusion. No pneumothorax. No acute osseous abnormality.  Severe anatomy wires are intact. IMPRESSION: No active disease. Electronically Signed   By: Tish Frederickson M.D.   On: 03/28/2024 00:44    EKG: I independently  viewed the EKG done and my findings are as followed: Sinus tachycardia rate of 106.  Nonspecific ST-T chest pain QTc 451.  Assessment/Plan Present on Admission:  Dyspnea  Principal Problem:   Dyspnea  Dyspnea suspect multifactorial Severe morbid obesity and CHF IV diuresis initiated in the ER, continue BMI 71 recommend weight loss outpatient Not hypoxic, maintain O2 saturation above 92%  Elevated troponin, suspect demand ischemia in the setting of volume overload Chest pain Troponin peaked at 37 and down-trended Follow 2D echo Monitor on telemetry  Type 2 diabetes with hyperglycemia Last hemoglobin A1c 8.1 on 01/07/2024 Resume basal insulin and short acting insulin Heart healthy, carb modified diet  Chronic HFpEF Continue diuresis with IV Lasix Resume home cardiac medications as blood pressure allows Monitor strict I's and O's and daily weight Follow 2D echo  Hypertension Home oral antihypertensives held due to normotensive. Closely monitor vital signs  Severe morbid obesity BMI 71 Recommend weight loss outpatient with regular physical activity and healthy dieting  Chronic anxiety Resume home regimen  Hyperlipidemia Resume home Lipitor  CKD 3B Appears to be at his baseline creatinine Avoid nephrotoxic agents and hypotension. Monitor urine output  History of acute urinary retention Obtain bladder scan In-N-Out Foley cath if needed    Time: 75 minutes.    DVT prophylaxis: Subcu Lovenox daily.  Code Status: Full code.  Family Communication: None at bedside.  Disposition Plan: Admitted to telemetry cardiac unit.  Consults called: None.  Admission status: Observation status.   Status is: Observation    Darlin Drop MD [facility] Pager [phone]  If 7PM-7AM, please contact night-coverage [email or web address] Password [facility]  03/28/2024, 3:18 AM

## 2024-03-28 NOTE — Progress Notes (Signed)
 2D Echocardiogram has been performed.  Joseph [name] 03/28/2024, 2:57 PM

## 2024-03-28 NOTE — Care Management Obs Status (Signed)
 MEDICARE OBSERVATION STATUS NOTIFICATION   Patient Details  Name: COBEN GODSHALL MRN: [id number] Date of Birth: May 05, 1971   Medicare Observation Status Notification Given:  Waylan Boga, RN 03/28/2024, 3:44 PM

## 2024-03-28 NOTE — Progress Notes (Signed)
 PROGRESS NOTE    Joseph [name]  WUJ:[id number] DOB: 1971-01-21 DOA: 03/27/2024 PCP: Cain Saupe, MD   Brief Narrative: Joseph [name] is a 53 y.o. male with a history of morbid obesity, bilateral BKA's, diabetes mellitus type 2, hypertension, chronic heart failure, COPD, CKD 3B.  Patient presented secondary to progressively worsening dyspnea with concern for acute heart failure.   Assessment/Plan:  Possible acute on chronic HFpEF Patient started on Lasix IV diuresis. No significant improvement in symptoms since admission. Transthoracic Echocardiogram ordered and pending. -Continue Lasix IV  Chest pain Somewhat atypical. Troponin mildly elevated at 37 with negative delta of 33. Significant cardiac history. Patient with improvement on nitroglycerin. Patient is on Ranexa as an outpatient -Follow-up Transthoracic Echocardiogram -Continue Ranexa -Likely cardiology consult in AM  CAD History of CABG x4 in 2021.  Diabetes mellitus type 2 Uncontrolled with hyperglycemia. Hemoglobin A1C of 8.1%.  Primary hypertension Antihypertensives held on admission. -Resume Coreg  Morbid obesity, class III Estimated body mass index is 71.8 kg/m as calculated from the following:   Height as of 01/07/24: 5\' 1"  (1.549 m).   Weight as of this encounter: 172.4 kg.  Anxiety -Continue Lexapro and Wellbutrin  Hyperlipidemia -Continue Lipitor  CKD stage IIIb Stable and at baseline.    DVT prophylaxis: Lovenox Code Status:   Code Status: Full Code Family Communication: None at bedside Disposition Plan: Discharge likely home pending improvement of symptoms   Consultants:  None  Procedures:  None  Antimicrobials: None    Subjective: Patient reports no chest pain at this time. Ongoing dyspnea.   Objective: BP 117/66 (BP Location: Right Arm)   Pulse 88   Temp (!) 97.5 F (36.4 C) (Oral)   Resp 11   Wt (!) 172.4 kg   SpO2 99%   BMI 71.80 kg/m   Examination:  General  exam: Appears calm and comfortable Respiratory system: Clear to auscultation. Respiratory effort normal. Cardiovascular system: S1 & S2 heard, RRR. 2/6 systolic murmur Gastrointestinal system: Abdomen is nondistended, soft and nontender. Normal bowel sounds heard. Central nervous system: Alert and oriented. Musculoskeletal: Bilateral BKA Psychiatry: Judgement and insight appear normal. Mood & affect appropriate.    Data Reviewed: I have personally reviewed following labs and imaging studies   Last CBC Lab Results  Component Value Date   WBC 7.7 03/28/2024   HGB 12.3 (L) 03/28/2024   HCT 38.3 (L) 03/28/2024   MCV 83.3 03/28/2024   MCH 26.7 03/28/2024   RDW 14.6 03/28/2024   PLT 225 03/28/2024     Last metabolic panel Lab Results  Component Value Date   GLUCOSE 191 (H) 03/28/2024   NA 138 03/28/2024   K 4.2 03/28/2024   CL 105 03/28/2024   CO2 24 03/28/2024   BUN 30 (H) 03/28/2024   CREATININE 1.71 (H) 03/28/2024   GFRNONAA 48 (L) 03/28/2024   CALCIUM 8.7 (L) 03/28/2024   PHOS 5.2 (H) 03/28/2024   PROT 5.8 (L) 01/09/2024   ALBUMIN 1.7 (L) 01/09/2024   LABGLOB 2.7 04/27/2023   AGRATIO 0.9 04/27/2023   BILITOT 0.5 01/09/2024   ALKPHOS 153 (H) 01/09/2024   AST 17 01/09/2024   ALT 21 01/09/2024   ANIONGAP 9 03/28/2024     Creatinine Clearance: Estimated Creatinine Clearance: 73.3 mL/min (A) (by C-G formula based on SCr of 1.71 mg/dL (H)).  No results found for this or any previous visit (from the past 240 hours).    Radiology Studies: DG Chest Port 1 View Result Date: 03/28/2024  CLINICAL DATA:  SOB, CHF EXAM: PORTABLE CHEST 1 VIEW COMPARISON:  Chest x-ray 01/07/2024 FINDINGS: The heart and mediastinal contours are unchanged. Surgical changes overlie the mediastinum. No focal consolidation. No pulmonary edema. No pleural effusion. No pneumothorax. No acute osseous abnormality.  Severe anatomy wires are intact. IMPRESSION: No active disease. Electronically Signed   By:  Tish Frederickson M.D.   On: 03/28/2024 00:44      LOS: 0 days    Jacquelin Hawking, MD [facility] 03/28/2024, 8:18 AM   If 7PM-7AM, please contact night-coverage [email or web address]

## 2024-03-28 NOTE — ED Notes (Signed)
 No call back from provider about pt BG. Nurse went ahead and gave the max sliding scale units that could be given.

## 2024-03-28 NOTE — ED Notes (Signed)
 Nurse went to NS to page provider about pt sugar

## 2024-03-29 DIAGNOSIS — Z794 Long term (current) use of insulin: Secondary | ICD-10-CM | POA: Diagnosis not present

## 2024-03-29 DIAGNOSIS — R0600 Dyspnea, unspecified: Secondary | ICD-10-CM | POA: Diagnosis not present

## 2024-03-29 DIAGNOSIS — E1165 Type 2 diabetes mellitus with hyperglycemia: Secondary | ICD-10-CM | POA: Diagnosis not present

## 2024-03-29 DIAGNOSIS — I10 Essential (primary) hypertension: Secondary | ICD-10-CM

## 2024-03-29 LAB — CBG MONITORING, ED: Glucose-Capillary: 341 mg/dL — ABNORMAL HIGH (ref 70–99)

## 2024-03-29 LAB — BASIC METABOLIC PANEL WITH GFR
Anion gap: 9 (ref 5–15)
BUN: 32 mg/dL — ABNORMAL HIGH (ref 6–20)
CO2: 24 mmol/L (ref 22–32)
Calcium: 8.7 mg/dL — ABNORMAL LOW (ref 8.9–10.3)
Chloride: 103 mmol/L (ref 98–111)
Creatinine, Ser: 1.78 mg/dL — ABNORMAL HIGH (ref 0.61–1.24)
GFR, Estimated: 46 mL/min — ABNORMAL LOW (ref >60)
Glucose, Bld: 342 mg/dL — ABNORMAL HIGH (ref 70–99)
Potassium: 4.2 mmol/L (ref 3.5–5.1)
Sodium: 136 mmol/L (ref 135–145)

## 2024-03-29 LAB — GLUCOSE, CAPILLARY
Glucose-Capillary: 345 mg/dL — ABNORMAL HIGH (ref 70–99)
Glucose-Capillary: 379 mg/dL — ABNORMAL HIGH (ref 70–99)
Glucose-Capillary: 402 mg/dL — ABNORMAL HIGH (ref 70–99)

## 2024-03-29 LAB — D-DIMER, QUANTITATIVE: D-Dimer, Quant: 1.2 ug{FEU}/mL — ABNORMAL HIGH (ref 0.00–0.50)

## 2024-03-29 MED ORDER — INSULIN GLARGINE-YFGN 100 UNIT/ML ~~LOC~~ SOLN
20.0000 [IU] | Freq: Two times a day (BID) | SUBCUTANEOUS | Status: DC
  Administered 2024-03-29 – 2024-03-30 (×2): 20 [IU] via SUBCUTANEOUS
  Filled 2024-03-29 (×6): qty 0.2

## 2024-03-29 MED ORDER — INSULIN NPH (HUMAN) (ISOPHANE) 100 UNIT/ML ~~LOC~~ SUSP
9.0000 [IU] | Freq: Once | SUBCUTANEOUS | Status: AC
  Administered 2024-03-29: 9 [IU] via SUBCUTANEOUS
  Filled 2024-03-29: qty 10

## 2024-03-29 MED ORDER — INSULIN NPH (HUMAN) (ISOPHANE) 100 UNIT/ML ~~LOC~~ SUSP: 6.0000 [IU] | Freq: Once | SUBCUTANEOUS | Status: DC

## 2024-03-29 NOTE — Plan of Care (Signed)
  Problem: Education: Goal: Ability to describe self-care measures that may prevent or decrease complications (Diabetes Survival Skills Education) will improve Outcome: Progressing   Problem: Coping: Goal: Ability to adjust to condition or change in health will improve Outcome: Progressing   Problem: Fluid Volume: Goal: Ability to maintain a balanced intake and output will improve Outcome: Progressing   Problem: Health Behavior/Discharge Planning: Goal: Ability to identify and utilize available resources and services will improve Outcome: Progressing   Problem: Nutritional: Goal: Maintenance of adequate nutrition will improve Outcome: Progressing   Problem: Metabolic: Goal: Ability to maintain appropriate glucose levels will improve Outcome: Not Progressing

## 2024-03-29 NOTE — Progress Notes (Signed)
 RT NOTE: RT to room with CPAP as ordered. PT says will try to wear but is not ready yet. RT told PT to call when ready and RT will place PT on CPAP for night.

## 2024-03-29 NOTE — Progress Notes (Signed)
 PROGRESS NOTE    Pheng Prokop Rucinski  WUJ:[id number] DOB: Apr 30, 1971 DOA: 03/27/2024 PCP: Cain Saupe, MD   Brief Narrative: Cache [place] Moor is a 53 y.o. male with a history of morbid obesity, bilateral BKA's, diabetes mellitus type 2, hypertension, chronic heart failure, COPD, CKD 3B.  Patient presented secondary to progressively worsening dyspnea with concern for acute heart failure. Lasix IV started without significant improvement in symptoms.   Assessment/Plan:  Possible acute on chronic HFpEF Patient started on Lasix IV diuresis. No significant improvement in symptoms since admission. Transthoracic Echocardiogram ordered and pending. -Continue Lasix IV -Follow-up Transthoracic Echocardiogram  Dyspnea Unclear if this is related to heart failure; no improvement with diuresis so far. -Check d-dimer and obtain V/Q scan if positive; if negative, will obtain a CT chest  Chest pain Somewhat atypical but patient has a history of unstable angina. Current episodes are consistent with unstable angina. Troponin mildly elevated at 37 with negative delta of 33. Significant cardiac history. Patient with improvement on nitroglycerin. Patient is on Ranexa as an outpatient. -Follow-up Transthoracic Echocardiogram -Continue Ranexa  CAD History of CABG x4 in 2021.  Diabetes mellitus type 2 Uncontrolled with hyperglycemia. Hemoglobin A1C of 8.1%. Currently uncontrolled. -Increase to Semglee 20 units BID and continue SSI  Primary hypertension Antihypertensives held on admission. -Continue Coreg  Morbid obesity, class III Estimated body mass index is 71.8 kg/m as calculated from the following:   Height as of 01/07/24: 5\' 1"  (1.549 m).   Weight as of this encounter: 172.4 kg.  Anxiety -Continue Lexapro and Wellbutrin  Hyperlipidemia -Continue Lipitor  CKD stage IIIb Stable and at baseline.    DVT prophylaxis: Lovenox Code Status:   Code Status: Full Code Family Communication: None  at bedside Disposition Plan: Discharge likely home pending improvement of symptoms   Consultants:  None  Procedures:  None  Antimicrobials: None    Subjective: Patient reports no chest pain at this time. Continued dyspnea which has not improved.  Objective: BP (!) 169/107 (BP Location: Left Arm)   Pulse 84   Temp 98.7 F (37.1 C) (Oral)   Resp 19   Wt (!) 172.4 kg   SpO2 97%   BMI 71.80 kg/m   Examination:  General exam: Appears calm and comfortable Respiratory system: Clear to auscultation. Respiratory effort normal. Cardiovascular system: S1 & S2 heard, RRR. Gastrointestinal system: Abdomen is nondistended, soft and nontender. Normal bowel sounds heard. Central nervous system: Alert and oriented. No focal neurological deficits. Psychiatry: Judgement and insight appear normal. Mood & affect appropriate.    Data Reviewed: I have personally reviewed following labs and imaging studies   Last CBC Lab Results  Component Value Date   WBC 7.7 03/28/2024   HGB 12.3 (L) 03/28/2024   HCT 38.3 (L) 03/28/2024   MCV 83.3 03/28/2024   MCH 26.7 03/28/2024   RDW 14.6 03/28/2024   PLT 225 03/28/2024     Last metabolic panel Lab Results  Component Value Date   GLUCOSE 342 (H) 03/29/2024   NA 136 03/29/2024   K 4.2 03/29/2024   CL 103 03/29/2024   CO2 24 03/29/2024   BUN 32 (H) 03/29/2024   CREATININE 1.78 (H) 03/29/2024   GFRNONAA 46 (L) 03/29/2024   CALCIUM 8.7 (L) 03/29/2024   PHOS 5.2 (H) 03/28/2024   PROT 5.8 (L) 01/09/2024   ALBUMIN 1.7 (L) 01/09/2024   LABGLOB 2.7 04/27/2023   AGRATIO 0.9 04/27/2023   BILITOT 0.5 01/09/2024   ALKPHOS 153 (H) 01/09/2024  AST 17 01/09/2024   ALT 21 01/09/2024   ANIONGAP 9 03/29/2024     Creatinine Clearance: Estimated Creatinine Clearance: 70.4 mL/min (A) (by C-G formula based on SCr of 1.78 mg/dL (H)).  No results found for this or any previous visit (from the past 240 hours).    Radiology  Studies: ECHOCARDIOGRAM COMPLETE Result Date: 03/28/2024    ECHOCARDIOGRAM REPORT   Patient Name:   NIV DARLEY [facility] Date of Exam: 03/28/2024 Medical Rec #:  [id number]       Height:       61.0 in Accession #:    [id number]      Weight:       380.0 lb Date of Birth:  Feb 01, 1971       BSA:          2.481 m Patient Age:    52 years        BP:           133/72 mmHg Patient Gender: M               HR:           85 bpm. Exam Location:  Inpatient Procedure: 2D Echo, Cardiac Doppler, Color Doppler and Intracardiac            Opacification Agent (Both Spectral and Color Flow Doppler were            utilized during procedure). Indications:    Chest Pain R07.9  History:        Patient has prior history of Echocardiogram examinations, most                 recent 04/27/2023. CHF, CAD, COPD and CKD, stage 3,                 Arrythmias:Atrial Flutter, Signs/Symptoms:Hypotension and                 Dyspnea; Risk Factors:Hypertension, Diabetes, Sleep Apnea and                 Current Smoker.  Sonographer:    Lucendia Herrlich RCS Referring Phys: [id number] CAROLE N HALL  Sonographer Comments: Technically difficult study due to poor echo windows, suboptimal parasternal window, suboptimal apical window, no subcostal window and patient is obese. IMPRESSIONS  1. Left ventricular ejection fraction, by estimation, is 55 to 60%. The left ventricle has normal function. The left ventricle has no regional wall motion abnormalities. There is mild concentric left ventricular hypertrophy. Left ventricular diastolic parameters were normal.  2. Right ventricular systolic function is normal. The right ventricular size is not well visualized.  3. The mitral valve was not well visualized. Trivial mitral valve regurgitation. No evidence of mitral stenosis.  4. Valve appears functionally bicuspid, with fused left and right coronary cusp. The aortic valve is abnormal. Aortic valve regurgitation is not visualized. Aortic valve sclerosis/calcification is present,  without any evidence of aortic stenosis.  5. Aortic dilatation noted. There is mild dilatation of the ascending aorta, measuring 41 mm. Comparison(s): No significant change from prior study. Conclusion(s)/Recommendation(s): Very limited windows/technically challening images, even with use of echo contrast. Grossly preserved LV function, no severe wall motion abnormalities noted, but reduced sensitivity for detection of focal wall motion abnormalities based on images. FINDINGS  Left Ventricle: Left ventricular ejection fraction, by estimation, is 55 to 60%. The left ventricle has normal function. The left ventricle has no regional wall motion abnormalities. Definity contrast agent was given IV to delineate  the left ventricular  endocardial borders. The left ventricular internal cavity size was normal in size. There is mild concentric left ventricular hypertrophy. Left ventricular diastolic parameters were normal. Right Ventricle: The right ventricular size is not well visualized. Right vetricular wall thickness was not well visualized. Right ventricular systolic function is normal. Left Atrium: Left atrial size was normal in size. Right Atrium: Right atrial size was not well visualized. Pericardium: There is no evidence of pericardial effusion. Mitral Valve: The mitral valve was not well visualized. Trivial mitral valve regurgitation. No evidence of mitral valve stenosis. Tricuspid Valve: The tricuspid valve is not well visualized. Tricuspid valve regurgitation is trivial. No evidence of tricuspid stenosis. Aortic Valve: Valve appears functionally bicuspid, with fused left and right coronary cusp. The aortic valve is abnormal. Aortic valve regurgitation is not visualized. Aortic valve sclerosis/calcification is present, without any evidence of aortic stenosis. Aortic valve peak gradient measures 4.3 mmHg. Pulmonic Valve: The pulmonic valve was not well visualized. Pulmonic valve regurgitation is not visualized. No  evidence of pulmonic stenosis. Aorta: Aortic dilatation noted. There is mild dilatation of the ascending aorta, measuring 41 mm. Venous: The inferior vena cava was not well visualized. IAS/Shunts: The interatrial septum was not well visualized.  LEFT VENTRICLE PLAX 2D LVIDd:         4.10 cm   Diastology LVIDs:         2.70 cm   LV e' medial:    9.48 cm/s LV PW:         1.50 cm   LV E/e' medial:  7.2 LV IVS:        1.20 cm   LV e' lateral:   8.55 cm/s LVOT diam:     2.50 cm   LV E/e' lateral: 8.0 LV SV:         82 LV SV Index:   33 LVOT Area:     4.91 cm  RIGHT VENTRICLE RV S prime:     10.70 cm/s LEFT ATRIUM           Index LA diam:      3.60 cm 1.45 cm/m LA Vol (A2C): 43.3 ml 17.45 ml/m  AORTIC VALVE AV Area (Vmax): 4.39 cm AV Vmax:        104.00 cm/s AV Peak Grad:   4.3 mmHg LVOT Vmax:      92.93 cm/s LVOT Vmean:     60.167 cm/s LVOT VTI:       0.167 m  AORTA Ao Root diam: 4.30 cm Ao Asc diam:  4.10 cm MITRAL VALVE MV Area (PHT): 2.99 cm     SHUNTS MV Decel Time: 254 msec     Systemic VTI:  0.17 m MV E velocity: 68.40 cm/s   Systemic Diam: 2.50 cm MV A velocity: 103.00 cm/s MV E/A ratio:  0.66 Jodelle Red MD Electronically signed by Jodelle Red MD Signature Date/Time: 03/28/2024/7:51:44 PM    Final    DG Chest Port 1 View Result Date: 03/28/2024 CLINICAL DATA:  SOB, CHF EXAM: PORTABLE CHEST 1 VIEW COMPARISON:  Chest x-ray 01/07/2024 FINDINGS: The heart and mediastinal contours are unchanged. Surgical changes overlie the mediastinum. No focal consolidation. No pulmonary edema. No pleural effusion. No pneumothorax. No acute osseous abnormality.  Severe anatomy wires are intact. IMPRESSION: No active disease. Electronically Signed   By: Tish Frederickson M.D.   On: 03/28/2024 00:44      LOS: 1 day    Jacquelin Hawking, MD [facility] 03/29/2024, 11:52 AM  If 7PM-7AM, please contact night-coverage [email or web address]

## 2024-03-30 ENCOUNTER — Inpatient Hospital Stay (HOSPITAL_COMMUNITY)

## 2024-03-30 ENCOUNTER — Other Ambulatory Visit: Payer: Self-pay | Admitting: Physician Assistant

## 2024-03-30 DIAGNOSIS — R0600 Dyspnea, unspecified: Secondary | ICD-10-CM | POA: Diagnosis not present

## 2024-03-30 DIAGNOSIS — I200 Unstable angina: Secondary | ICD-10-CM

## 2024-03-30 LAB — GLUCOSE, CAPILLARY
Glucose-Capillary: 375 mg/dL — ABNORMAL HIGH (ref 70–99)
Glucose-Capillary: 430 mg/dL — ABNORMAL HIGH (ref 70–99)
Glucose-Capillary: 318 mg/dL — ABNORMAL HIGH (ref 70–99)
Glucose-Capillary: 268 mg/dL — ABNORMAL HIGH (ref 70–99)

## 2024-03-30 LAB — BASIC METABOLIC PANEL WITH GFR
Anion gap: 9 (ref 5–15)
BUN: 42 mg/dL — ABNORMAL HIGH (ref 6–20)
CO2: 24 mmol/L (ref 22–32)
Calcium: 8.3 mg/dL — ABNORMAL LOW (ref 8.9–10.3)
Chloride: 103 mmol/L (ref 98–111)
Creatinine, Ser: 2.11 mg/dL — ABNORMAL HIGH (ref 0.61–1.24)
GFR, Estimated: 37 mL/min — ABNORMAL LOW (ref >60)
Glucose, Bld: 356 mg/dL — ABNORMAL HIGH (ref 70–99)
Potassium: 4.5 mmol/L (ref 3.5–5.1)
Sodium: 136 mmol/L (ref 135–145)

## 2024-03-30 MED ORDER — ALPRAZOLAM 0.5 MG PO TABS
0.5000 mg | ORAL_TABLET | Freq: Once | ORAL | Status: AC
  Administered 2024-03-30: 0.5 mg via ORAL
  Filled 2024-03-30: qty 1

## 2024-03-30 MED ORDER — INSULIN ASPART 100 UNIT/ML IJ SOLN: 0.0000 [IU] | Freq: Three times a day (TID) | INTRAMUSCULAR | Status: DC

## 2024-03-30 MED ORDER — INSULIN GLARGINE-YFGN 100 UNIT/ML ~~LOC~~ SOLN
35.0000 [IU] | Freq: Two times a day (BID) | SUBCUTANEOUS | Status: DC
  Administered 2024-03-30 – 2024-03-31 (×2): 35 [IU] via SUBCUTANEOUS
  Filled 2024-03-30 (×3): qty 0.35

## 2024-03-30 MED ORDER — INSULIN ASPART 100 UNIT/ML IJ SOLN
0.0000 [IU] | Freq: Three times a day (TID) | INTRAMUSCULAR | Status: DC
  Administered 2024-03-30: 15 [IU] via SUBCUTANEOUS
  Administered 2024-03-30 – 2024-03-31 (×2): 20 [IU] via SUBCUTANEOUS
  Administered 2024-03-31 – 2024-04-01 (×3): 11 [IU] via SUBCUTANEOUS
  Administered 2024-04-01: 7 [IU] via SUBCUTANEOUS
  Administered 2024-04-01 – 2024-04-02 (×3): 11 [IU] via SUBCUTANEOUS
  Administered 2024-04-02 – 2024-04-03 (×2): 7 [IU] via SUBCUTANEOUS
  Administered 2024-04-03: 11 [IU] via SUBCUTANEOUS
  Administered 2024-04-03: 7 [IU] via SUBCUTANEOUS
  Administered 2024-04-04: 3 [IU] via SUBCUTANEOUS
  Administered 2024-04-04: 7 [IU] via SUBCUTANEOUS
  Administered 2024-04-04 – 2024-04-05 (×3): 4 [IU] via SUBCUTANEOUS
  Administered 2024-04-06: 3 [IU] via SUBCUTANEOUS
  Administered 2024-04-06: 4 [IU] via SUBCUTANEOUS

## 2024-03-30 MED ORDER — TECHNETIUM TO 99M ALBUMIN AGGREGATED
4.3200 | Freq: Once | INTRAVENOUS | Status: AC | PRN
Start: 2024-03-30 — End: 2024-03-30
  Administered 2024-03-30: 4.32 via INTRAVENOUS

## 2024-03-30 NOTE — TOC CM/SW Note (Signed)
 Transition of Care [facility]) - Inpatient Brief Assessment   Patient Details  Name: AARNAV STEAGALL MRN: [id number] Date of Birth: May 04, 1971  Transition of Care [facility]) CM/SW Contact:    Michaela Corner, LCSWA Phone Number: 03/30/2024, 10:27 AM   Clinical Narrative: CSW introduced self/role to patient at bedside. CSW and patient discussed SDOH needs for Transportation. Patient stated it has been harder getting to and from medical appointments since his medicaid benefits are changing and his transportation services will no longer be available at the end of the month. CSW encouraged patient to call his current insurance company [facility]) to inquire about transportation services. CSW also provided patient with other transportation resources for [facility].   TOC will continue to follow as needed.    Transition of Care Asessment: Insurance and Status: Insurance coverage has been reviewed Patient has primary care physician: Yes Home environment has been reviewed: Home Prior level of function:: Independent Prior/Current Home Services: No current home services Social Drivers of Health Review: SDOH reviewed interventions complete Readmission risk has been reviewed: Yes Transition of care needs: no transition of care needs at this time

## 2024-03-30 NOTE — Inpatient Diabetes Management (Signed)
 Inpatient Diabetes Program Recommendations  AACE/ADA: New Consensus Statement on Inpatient Glycemic Control (2015)  Target Ranges:  Prepandial:   less than 140 mg/dL      Peak postprandial:   less than 180 mg/dL (1-2 hours)      Critically ill patients:  140 - 180 mg/dL   Lab Results  Component Value Date   GLUCAP 375 (H) 03/30/2024   HGBA1C 8.1 (H) 01/07/2024    Review of Glycemic Control  Latest Reference Range & Units 03/29/24 07:58 03/29/24 11:57 03/29/24 16:33 03/29/24 21:06 03/30/24 07:24  Glucose-Capillary 70 - 99 mg/dL 034 (H) 742 (H) 595 (H) 402 (H) 375 (H)  (H): Data is abnormally high  Diabetes history: DM2 Outpatient Diabetes medications: Lantus 45 units BID, Novolog 0-20 units TID Current orders for Inpatient glycemic control: Semglee 20 units BID, Novolog 0-9 units TID and 0-5 units QHS  Inpatient Diabetes Program Recommendations:    Semglee 35 units BID Novolog 6 units TID with meals if he consumes at least 50%  Met with patient at bedside this morning.  He confirms that he takes Lantus 45 units BID and Novolog 0-20 units TID.  Will continue to follow while inpatient.  Thank you, Dulce Sellar, MSN, CDCES Diabetes Coordinator Inpatient Diabetes Program [phone] (team pager from 8a-5p)

## 2024-03-30 NOTE — Progress Notes (Addendum)
 PROGRESS NOTE    Joseph [name]  ZOX:[id number] DOB: [birth year] DOA: 03/27/2024 PCP: Cain Saupe, MD   Brief Narrative: Joseph [name] is a 53 y.o. male with a history of morbid obesity, bilateral BKA's, diabetes mellitus type 2, hypertension, chronic heart failure, COPD, CKD 3B.  Patient presented secondary to progressively worsening dyspnea with concern for acute heart failure. Lasix IV started without significant improvement in symptoms.   Assessment/Plan:  Possible acute on chronic HFpEF Patient started on Lasix IV diuresis. No significant improvement in symptoms since admission. Transthoracic Echocardiogram ordered and is significant for normal LVEF with no RV dysfunction. Creatinine up today. -Hold Lasix  Dyspnea Unclear if this is related to heart failure; no improvement with diuresis so far. D-dimer elevated. -Check V/Q scan if positive; if negative, will obtain a CT chest  Chest pain Somewhat atypical but patient has a history of unstable angina. Current episodes are consistent with unstable angina. Troponin mildly elevated at 37 with negative delta of 33. Significant cardiac history. Patient with improvement on nitroglycerin. Patient is on Ranexa as an outpatient. Transthoracic Echocardiogram without new pathology. -Continue Ranexa  CAD History of CABG x4 in 2021.  Diabetes mellitus type 2 Uncontrolled with hyperglycemia. Hemoglobin A1C of 8.1%. Currently uncontrolled. -Increase to Semglee 20 units BID and continue SSI  Primary hypertension Antihypertensives held on admission. -Continue Coreg  Morbid obesity, class III Estimated body mass index is 73.86 kg/m as calculated from the following:   Height as of this encounter: 5\' 1"  (1.549 m).   Weight as of this encounter: 177.3 kg.  Anxiety -Continue Lexapro and Wellbutrin  Hyperlipidemia -Continue Lipitor  CKD stage IIIb Creatinine elevated in setting of Lasix diuresis. -Hold Lasix today  Abnormal  aortic valve Patient noted to have a functional bicuspid valve with two fused valves noted. Patient will need cardiology follow-up.   DVT prophylaxis: Lovenox Code Status:   Code Status: Full Code Family Communication: None at bedside Disposition Plan: Discharge likely home pending improvement of symptoms   Consultants:  None  Procedures:  Transthoracic Echocardiogram  Antimicrobials: None    Subjective: Continued dyspnea. Intermittent angina symptoms.  Objective: BP (!) 113/93 (BP Location: Right Arm)   Pulse 95   Temp 98.2 F (36.8 C) (Oral)   Resp 16   Ht 5\' 1"  (1.549 m) Comment: BKA Bi-lateral  Wt (!) 177.3 kg   SpO2 94%   BMI 73.86 kg/m   Examination:  General exam: Appears calm and comfortable Respiratory system: Clear to auscultation. Respiratory effort normal. Cardiovascular system: S1 & S2 heard, RRR. 2/6 systolic murmur Gastrointestinal system: Abdomen is nondistended, soft and nontender. Normal bowel sounds heard. Central nervous system: Alert and oriented. No focal neurological deficits. Musculoskeletal: BLE BKA. Psychiatry: Judgement and insight appear normal. Mood & affect appropriate.    Data Reviewed: I have personally reviewed following labs and imaging studies   Last CBC Lab Results  Component Value Date   WBC 7.7 03/28/2024   HGB 12.3 (L) 03/28/2024   HCT 38.3 (L) 03/28/2024   MCV 83.3 03/28/2024   MCH 26.7 03/28/2024   RDW 14.6 03/28/2024   PLT 225 03/28/2024     Last metabolic panel Lab Results  Component Value Date   GLUCOSE 356 (H) 03/30/2024   NA 136 03/30/2024   K 4.5 03/30/2024   CL 103 03/30/2024   CO2 24 03/30/2024   BUN 42 (H) 03/30/2024   CREATININE 2.11 (H) 03/30/2024   GFRNONAA 37 (L) 03/30/2024   CALCIUM  8.3 (L) 03/30/2024   PHOS 5.2 (H) 03/28/2024   PROT 5.8 (L) 01/09/2024   ALBUMIN 1.7 (L) 01/09/2024   LABGLOB 2.7 04/27/2023   AGRATIO 0.9 04/27/2023   BILITOT 0.5 01/09/2024   ALKPHOS 153 (H) 01/09/2024    AST 17 01/09/2024   ALT 21 01/09/2024   ANIONGAP 9 03/30/2024     Creatinine Clearance: Estimated Creatinine Clearance: 60.6 mL/min (A) (by C-G formula based on SCr of 2.11 mg/dL (H)).  No results found for this or any previous visit (from the past 240 hours).    Radiology Studies: ECHOCARDIOGRAM COMPLETE Result Date: 03/28/2024    ECHOCARDIOGRAM REPORT   Patient Name:   Joseph [name] [facility] Date of Exam: 03/28/2024 Medical Rec #:  [id number]       Height:       61.0 in Accession #:    [id number]      Weight:       380.0 lb Date of Birth:  04-18-1971       BSA:          2.481 m Patient Age:    52 years        BP:           133/72 mmHg Patient Gender: M               HR:           85 bpm. Exam Location:  Inpatient Procedure: 2D Echo, Cardiac Doppler, Color Doppler and Intracardiac            Opacification Agent (Both Spectral and Color Flow Doppler were            utilized during procedure). Indications:    Chest Pain R07.9  History:        Patient has prior history of Echocardiogram examinations, most                 recent 04/27/2023. CHF, CAD, COPD and CKD, stage 3,                 Arrythmias:Atrial Flutter, Signs/Symptoms:Hypotension and                 Dyspnea; Risk Factors:Hypertension, Diabetes, Sleep Apnea and                 Current Smoker.  Sonographer:    Lucendia Herrlich RCS Referring Phys: [id number] CAROLE N HALL  Sonographer Comments: Technically difficult study due to poor echo windows, suboptimal parasternal window, suboptimal apical window, no subcostal window and patient is obese. IMPRESSIONS  1. Left ventricular ejection fraction, by estimation, is 55 to 60%. The left ventricle has normal function. The left ventricle has no regional wall motion abnormalities. There is mild concentric left ventricular hypertrophy. Left ventricular diastolic parameters were normal.  2. Right ventricular systolic function is normal. The right ventricular size is not well visualized.  3. The mitral valve was not  well visualized. Trivial mitral valve regurgitation. No evidence of mitral stenosis.  4. Valve appears functionally bicuspid, with fused left and right coronary cusp. The aortic valve is abnormal. Aortic valve regurgitation is not visualized. Aortic valve sclerosis/calcification is present, without any evidence of aortic stenosis.  5. Aortic dilatation noted. There is mild dilatation of the ascending aorta, measuring 41 mm. Comparison(s): No significant change from prior study. Conclusion(s)/Recommendation(s): Very limited windows/technically challening images, even with use of echo contrast. Grossly preserved LV function, no severe wall motion abnormalities noted, but reduced sensitivity for detection of  focal wall motion abnormalities based on images. FINDINGS  Left Ventricle: Left ventricular ejection fraction, by estimation, is 55 to 60%. The left ventricle has normal function. The left ventricle has no regional wall motion abnormalities. Definity contrast agent was given IV to delineate the left ventricular  endocardial borders. The left ventricular internal cavity size was normal in size. There is mild concentric left ventricular hypertrophy. Left ventricular diastolic parameters were normal. Right Ventricle: The right ventricular size is not well visualized. Right vetricular wall thickness was not well visualized. Right ventricular systolic function is normal. Left Atrium: Left atrial size was normal in size. Right Atrium: Right atrial size was not well visualized. Pericardium: There is no evidence of pericardial effusion. Mitral Valve: The mitral valve was not well visualized. Trivial mitral valve regurgitation. No evidence of mitral valve stenosis. Tricuspid Valve: The tricuspid valve is not well visualized. Tricuspid valve regurgitation is trivial. No evidence of tricuspid stenosis. Aortic Valve: Valve appears functionally bicuspid, with fused left and right coronary cusp. The aortic valve is abnormal.  Aortic valve regurgitation is not visualized. Aortic valve sclerosis/calcification is present, without any evidence of aortic stenosis. Aortic valve peak gradient measures 4.3 mmHg. Pulmonic Valve: The pulmonic valve was not well visualized. Pulmonic valve regurgitation is not visualized. No evidence of pulmonic stenosis. Aorta: Aortic dilatation noted. There is mild dilatation of the ascending aorta, measuring 41 mm. Venous: The inferior vena cava was not well visualized. IAS/Shunts: The interatrial septum was not well visualized.  LEFT VENTRICLE PLAX 2D LVIDd:         4.10 cm   Diastology LVIDs:         2.70 cm   LV e' medial:    9.48 cm/s LV PW:         1.50 cm   LV E/e' medial:  7.2 LV IVS:        1.20 cm   LV e' lateral:   8.55 cm/s LVOT diam:     2.50 cm   LV E/e' lateral: 8.0 LV SV:         82 LV SV Index:   33 LVOT Area:     4.91 cm  RIGHT VENTRICLE RV S prime:     10.70 cm/s LEFT ATRIUM           Index LA diam:      3.60 cm 1.45 cm/m LA Vol (A2C): 43.3 ml 17.45 ml/m  AORTIC VALVE AV Area (Vmax): 4.39 cm AV Vmax:        104.00 cm/s AV Peak Grad:   4.3 mmHg LVOT Vmax:      92.93 cm/s LVOT Vmean:     60.167 cm/s LVOT VTI:       0.167 m  AORTA Ao Root diam: 4.30 cm Ao Asc diam:  4.10 cm MITRAL VALVE MV Area (PHT): 2.99 cm     SHUNTS MV Decel Time: 254 msec     Systemic VTI:  0.17 m MV E velocity: 68.40 cm/s   Systemic Diam: 2.50 cm MV A velocity: 103.00 cm/s MV E/A ratio:  0.66 Jodelle Red MD Electronically signed by Jodelle Red MD Signature Date/Time: 03/28/2024/7:51:44 PM    Final       LOS: 2 days    Jacquelin Hawking, MD [facility] 03/30/2024, 8:27 AM   If 7PM-7AM, please contact night-coverage [email or web address]

## 2024-03-30 NOTE — Progress Notes (Signed)
   03/30/24 2220  BiPAP/CPAP/SIPAP  $ Non-Invasive Ventilator  Non-Invasive Vent Set Up;Non-Invasive Vent Initial  $ Face Mask Large  Yes  BiPAP/CPAP/SIPAP Pt Type Adult  BiPAP/CPAP/SIPAP DREAMSTATIOND  Mask Type Full face mask  Dentures removed? Not applicable  Mask Size Large  Respiratory Rate 20 breaths/min  IPAP 20 cmH20  EPAP 5 cmH2O  Pressure Support 5 cmH20  FiO2 (%) 21 %  Patient Home Machine No  Patient Home Mask No  Patient Home Tubing No  Auto Titrate Yes  Device Plugged into RED Power Outlet Yes  BiPAP/CPAP /SiPAP Vitals  Pulse Rate 85  MEWS Score/Color  MEWS Score 0  MEWS Score Color Green

## 2024-03-30 NOTE — Plan of Care (Signed)
  Problem: Education: Goal: Ability to describe self-care measures that may prevent or decrease complications (Diabetes Survival Skills Education) will improve Outcome: Progressing   Problem: Coping: Goal: Ability to adjust to condition or change in health will improve Outcome: Progressing   Problem: Fluid Volume: Goal: Ability to maintain a balanced intake and output will improve Outcome: Progressing   Problem: Health Behavior/Discharge Planning: Goal: Ability to identify and utilize available resources and services will improve Outcome: Progressing   Problem: Health Behavior/Discharge Planning: Goal: Ability to identify and utilize available resources and services will improve Outcome: Progressing   Problem: Health Behavior/Discharge Planning: Goal: Ability to manage health-related needs will improve Outcome: Progressing

## 2024-03-31 ENCOUNTER — Inpatient Hospital Stay (HOSPITAL_COMMUNITY)

## 2024-03-31 DIAGNOSIS — R600 Localized edema: Secondary | ICD-10-CM | POA: Diagnosis not present

## 2024-03-31 DIAGNOSIS — N179 Acute kidney failure, unspecified: Secondary | ICD-10-CM | POA: Diagnosis not present

## 2024-03-31 DIAGNOSIS — R0600 Dyspnea, unspecified: Secondary | ICD-10-CM | POA: Diagnosis not present

## 2024-03-31 LAB — URINALYSIS, ROUTINE W REFLEX MICROSCOPIC
Bilirubin Urine: NEGATIVE
Glucose, UA: >=500 mg/dL — AB
Hgb urine dipstick: NEGATIVE
Ketones, ur: NEGATIVE mg/dL
Nitrite: NEGATIVE
Protein, ur: >=300 mg/dL — AB
Specific Gravity, Urine: 1.016 (ref 1.005–1.030)
WBC, UA: >50 WBC/hpf (ref 0–5)
pH: 7 (ref 5.0–8.0)

## 2024-03-31 LAB — BASIC METABOLIC PANEL WITH GFR
Anion gap: 12 (ref 5–15)
BUN: 53 mg/dL — ABNORMAL HIGH (ref 6–20)
CO2: 20 mmol/L — ABNORMAL LOW (ref 22–32)
Calcium: 8.2 mg/dL — ABNORMAL LOW (ref 8.9–10.3)
Chloride: 102 mmol/L (ref 98–111)
Creatinine, Ser: 2.49 mg/dL — ABNORMAL HIGH (ref 0.61–1.24)
GFR, Estimated: 31 mL/min — ABNORMAL LOW (ref >60)
Glucose, Bld: 340 mg/dL — ABNORMAL HIGH (ref 70–99)
Potassium: 5.2 mmol/L — ABNORMAL HIGH (ref 3.5–5.1)
Sodium: 134 mmol/L — ABNORMAL LOW (ref 135–145)

## 2024-03-31 LAB — GLUCOSE, CAPILLARY
Glucose-Capillary: 401 mg/dL — ABNORMAL HIGH (ref 70–99)
Glucose-Capillary: 276 mg/dL — ABNORMAL HIGH (ref 70–99)
Glucose-Capillary: 290 mg/dL — ABNORMAL HIGH (ref 70–99)
Glucose-Capillary: 232 mg/dL — ABNORMAL HIGH (ref 70–99)

## 2024-03-31 LAB — MAGNESIUM: Magnesium: 1.5 mg/dL — ABNORMAL LOW (ref 1.7–2.4)

## 2024-03-31 LAB — POTASSIUM: Potassium: 5.2 mmol/L — ABNORMAL HIGH (ref 3.5–5.1)

## 2024-03-31 MED ORDER — SODIUM CHLORIDE 0.9 % IV SOLN
1.0000 g | INTRAVENOUS | Status: DC
  Administered 2024-03-31 – 2024-04-03 (×4): 1 g via INTRAVENOUS
  Filled 2024-03-31 (×4): qty 10

## 2024-03-31 MED ORDER — MAGNESIUM SULFATE 4 GM/100ML IV SOLN
4.0000 g | Freq: Once | INTRAVENOUS | Status: AC
Start: 2024-03-31 — End: 2024-03-31
  Administered 2024-03-31: 4 g via INTRAVENOUS
  Filled 2024-03-31: qty 100

## 2024-03-31 MED ORDER — PREGABALIN 100 MG PO CAPS
100.0000 mg | ORAL_CAPSULE | Freq: Two times a day (BID) | ORAL | Status: DC
Start: 2024-03-31 — End: 2024-04-06
  Administered 2024-03-31 – 2024-04-06 (×12): 100 mg via ORAL
  Filled 2024-03-31 (×12): qty 1

## 2024-03-31 MED ORDER — INSULIN ASPART 100 UNIT/ML IJ SOLN
10.0000 [IU] | Freq: Once | INTRAMUSCULAR | Status: DC
Start: 2024-03-31 — End: 2024-03-31

## 2024-03-31 MED ORDER — INSULIN GLARGINE-YFGN 100 UNIT/ML ~~LOC~~ SOLN
40.0000 [IU] | Freq: Two times a day (BID) | SUBCUTANEOUS | Status: DC
  Administered 2024-03-31 – 2024-04-03 (×6): 40 [IU] via SUBCUTANEOUS
  Filled 2024-03-31 (×7): qty 0.4

## 2024-03-31 MED ORDER — MIDODRINE HCL 5 MG PO TABS
10.0000 mg | ORAL_TABLET | Freq: Two times a day (BID) | ORAL | Status: DC
  Administered 2024-03-31 – 2024-04-06 (×12): 10 mg via ORAL
  Filled 2024-03-31 (×12): qty 2

## 2024-03-31 MED ORDER — FUROSEMIDE 10 MG/ML IJ SOLN
80.0000 mg | Freq: Two times a day (BID) | INTRAMUSCULAR | Status: DC
  Administered 2024-03-31 – 2024-04-05 (×10): 80 mg via INTRAVENOUS
  Filled 2024-03-31 (×10): qty 8

## 2024-03-31 NOTE — Consult Note (Signed)
 [facility] Renal Consultation Note  Requesting MD: Nettey Indication for Consultation: A on CRF  HPI:  Joseph [name] is a 53 y.o. male with DM, HTN, HFpEF, COPD,  super morbid obesity as well as bilateral BKAs. He also appears to have CKD-  baseline crt in the high 1's-  looks like AKI in Jan of 25 where crt peaked at 3.3 but then improved to baseline 1.78 on 01/16/24.  He presented to medical attention on 4/9.  He had seen his cardiologist c/o SOB, DOE, orthopnea and worsening edema-  found to have elevated BNP so was told to present to hsoptial.  Crt was at baseline of 1.7 at that time.  Over the last 48 hours- crt has increased to 2.49 and that is the reason for consult.   During hospitalization BP has been soft, as low as 90/60.  He has been diuresed but not aggressively -  only 1100-1200 cc per 24 hours of UOP recorded.  There have been no nephrotoxic meds given.   Renal ultrasound in Jan of 25 pretty normal-  not repeated but has not been oliguric.  Urine shows pyuria and > 300 of protein ( this is not new) He says he has seen Franky Ivanoff in our office but not for some time.  He c/o LUTS.  Has mobic on his med list and says has been taking it recently   Creat  Date/Time Value Ref Range Status  09/29/2016 11:34 AM 1.21 0.60 - 1.35 mg/dL Final   Creatinine, Ser  Date/Time Value Ref Range Status  03/31/2024 03:24 AM 2.49 (H) 0.61 - 1.24 mg/dL Final  82/95/6213 08:65 AM 2.11 (H) 0.61 - 1.24 mg/dL Final  78/46/9629 52:84 AM 1.78 (H) 0.61 - 1.24 mg/dL Final  13/24/4010 27:25 AM 1.71 (H) 0.61 - 1.24 mg/dL Final  36/64/4034 74:25 PM 1.68 (H) 0.61 - 1.24 mg/dL Final  95/63/8756 43:32 AM 1.78 (H) 0.61 - 1.24 mg/dL Final  95/18/8416 60:63 AM 1.90 (H) 0.61 - 1.24 mg/dL Final  01/60/1093 23:55 AM 1.98 (H) 0.61 - 1.24 mg/dL Final  73/22/0254 27:06 AM 2.25 (H) 0.61 - 1.24 mg/dL Final  23/76/2831 51:76 AM 2.17 (H) 0.61 - 1.24 mg/dL Final  16/06/3709 62:69 AM 2.05 (H) 0.61 - 1.24 mg/dL Final   48/54/6270 35:00 AM 2.31 (H) 0.61 - 1.24 mg/dL Final  93/81/8299 37:16 AM 2.56 (H) 0.61 - 1.24 mg/dL Final  96/78/9381 01:75 AM 2.80 (H) 0.61 - 1.24 mg/dL Final  10/13/8526 78:24 AM 3.29 (H) 0.61 - 1.24 mg/dL Final  23/53/6144 31:54 AM 3.36 (H) 0.61 - 1.24 mg/dL Final  00/86/7619 50:93 AM 1.55 (H) 0.61 - 1.24 mg/dL Final  26/71/2458 09:98 AM 1.67 (H) 0.61 - 1.24 mg/dL Final  33/82/5053 97:67 AM 1.86 (H) 0.61 - 1.24 mg/dL Final  34/19/3790 24:09 AM 1.77 (H) 0.61 - 1.24 mg/dL Final  73/53/2992 42:68 AM 1.85 (H) 0.61 - 1.24 mg/dL Final  34/19/6222 97:98 AM 1.70 (H) 0.61 - 1.24 mg/dL Final  92/10/9416 40:81 AM 1.81 (H) 0.61 - 1.24 mg/dL Final  44/81/8563 14:97 AM 2.13 (H) 0.61 - 1.24 mg/dL Final  02/63/7858 85:02 AM 2.23 (H) 0.61 - 1.24 mg/dL Final  77/41/2878 67:67 AM 2.25 (H) 0.61 - 1.24 mg/dL Final  20/94/7096 28:36 AM 2.24 (H) 0.61 - 1.24 mg/dL Final  62/94/7654 65:03 AM 2.09 (H) 0.61 - 1.24 mg/dL Final  54/65/6812 75:17 AM 1.90 (H) 0.61 - 1.24 mg/dL Final  00/17/4944 96:75 PM 1.76 (H) 0.61 - 1.24 mg/dL Final  04/28/2023 12:35 AM 1.75 (H) 0.61 - 1.24 mg/dL Final  08/65/7846 96:29 PM 1.69 (H) 0.61 - 1.24 mg/dL Final  52/84/1324 40:10 AM 1.70 (H) 0.61 - 1.24 mg/dL Final  27/25/3664 40:34 PM 1.71 (H) 0.61 - 1.24 mg/dL Final  74/25/9563 87:56 PM 1.86 (H) 0.61 - 1.24 mg/dL Final  43/32/9518 84:16 PM 1.84 (H) 0.76 - 1.27 mg/dL Final  60/63/0160 10:93 PM 1.49 (H) 0.76 - 1.27 mg/dL Final  23/55/7322 02:54 AM 1.12 0.61 - 1.24 mg/dL Final  27/05/2375 28:31 AM 1.29 (H) 0.61 - 1.24 mg/dL Final  51/76/1607 37:10 AM 1.29 (H) 0.61 - 1.24 mg/dL Final  62/69/4854 62:70 AM 1.24 0.61 - 1.24 mg/dL Final  35/00/9381 82:99 AM 1.20 0.61 - 1.24 mg/dL Final  37/16/9678 93:81 AM 1.14 0.61 - 1.24 mg/dL Final  01/75/1025 85:27 AM 1.34 (H) 0.61 - 1.24 mg/dL Final  78/24/2353 61:44 AM 1.44 (H) 0.61 - 1.24 mg/dL Final  31/54/0086 76:19 AM 2.22 (H) 0.61 - 1.24 mg/dL Final  50/93/2671 24:58 PM 2.46 (H) 0.61 -  1.24 mg/dL Final  09/98/3382 50:53 AM 2.57 (H) 0.61 - 1.24 mg/dL Final  97/67/3419 37:90 PM 2.54 (H) 0.61 - 1.24 mg/dL Final  24/08/7352 29:92 PM 1.93 (H) 0.61 - 1.24 mg/dL Final  42/68/3419 62:22 AM 1.44 (H) 0.76 - 1.27 mg/dL Final  97/98/9211 94:17 AM 1.53 (H) 0.61 - 1.24 mg/dL Final     PMHx:   Past Medical History:  Diagnosis Date   Aneurysm of ascending aorta (HCC) 09/14/2021   Anxiety    Aortic atherosclerosis (HCC) 11/28/2023   Arthritis    "knees, shoulders, hips, ankles" (07/29/2016)   Asthma    Bicuspid aortic valve 09/14/2021   BMI 70 and over, adult (HCC) 02/16/2019   CAD (coronary artery disease)    a. 2017: s/p BMS to distal Cx; b. LHC 05/30/2019: 80% mid, distal RCA s/p DES, 30% narrowing of d LM, widely patent LAD w/ luminal irregularities, widely patent stent in dCX  w/ 90+% stenosis distal to stent beofre small trifurcating obtuse marginal (potentially area of restenosis)   Chronic bronchitis (HCC)    Chronic ulcer of great toe of right foot (HCC) 09/09/2017   Chronic ulcer of right great toe (HCC) 09/19/2017   COPD (chronic obstructive pulmonary disease) (HCC)    Coronary arteriosclerosis    Deep venous thrombosis (HCC) 02/16/2019   Depression    Diabetes mellitus (HCC)    DVT (deep venous thrombosis) (HCC)    GERD (gastroesophageal reflux disease)    Hyperlipidemia    Hypertension    Migraine    Morbid obesity (HCC)    Morbid obesity with BMI of 70 and over, adult (HCC) 11/28/2023   Myocardial infarction (HCC) 1996   "light one"   Non-ST elevation (NSTEMI) myocardial infarction (HCC) 07/22/2020   PE (pulmonary embolism) 04/2013   On chronic Xarelto   Peripheral nerve disease    Pneumonia "several times"   Sleep apnea    Type II diabetes mellitus (HCC)     Past Surgical History:  Procedure Laterality Date   ABDOMINAL AORTOGRAM W/LOWER EXTREMITY N/A 10/10/2020   Procedure: ABDOMINAL AORTOGRAM W/LOWER EXTREMITY;  Surgeon: Richrd Char, MD;   [facility]: [facility];  Service: Cardiovascular;  Laterality: N/A;   AMPUTATION Right 09/21/2017   Procedure: RIGHT GREAT TOE AMPUTATION, POSSIBLE VAC;  Surgeon: Wes Hamman, MD;  Location: [facility] OR;  Service: Orthopedics;  Laterality: Right;   AMPUTATION Left 08/13/2020   Procedure: LEFT FOOT 4TH  RAY AMPUTATION;  Surgeon: Timothy Ford, MD;  Location: [facility] OR;  Service: Orthopedics;  Laterality: Left;   AMPUTATION Left 11/20/2021   Procedure: LEFT TRANSMETATARSAL AMPUTATION;  Surgeon: Timothy Ford, MD;  Location: [facility] OR;  Service: Orthopedics;  Laterality: Left;   AMPUTATION Left 01/08/2022   Procedure: LEFT BELOW KNEE AMPUTATION;  Surgeon: Timothy Ford, MD;  Location: [facility] OR;  Service: Orthopedics;  Laterality: Left;   AMPUTATION Right 07/07/2022   Procedure: RIGHT BELOW KNEE AMPUTATION;  Surgeon: Timothy Ford, MD;  Location: [facility] OR;  Service: Orthopedics;  Laterality: Right;   AORTOGRAM Bilateral 03/13/2021   Procedure: ABDOMINAL AORTOGRAM WITH Left LOWER EXTREMITY RUNOFF;  Surgeon: Carlene Che, MD;  Location: [facility] OR;  Service: Vascular;  Laterality: Bilateral;   APPLICATION OF WOUND VAC  01/08/2022   Procedure: APPLICATION OF WOUND VAC;  Surgeon: Timothy Ford, MD;  Location: [facility] OR;  Service: Orthopedics;;   APPLICATION OF WOUND VAC Right 07/07/2022   Procedure: APPLICATION OF WOUND VAC;  Surgeon: Timothy Ford, MD;  [facility]: [facility] OR;  Service: Orthopedics;  Laterality: Right;   CARDIAC CATHETERIZATION  2006   CARDIAC CATHETERIZATION  1996   "@ [facility]; when I had my heart attack"   CARDIAC CATHETERIZATION N/A 07/29/2016   Procedure: Left Heart Cath and Coronary Angiography;  Surgeon: Avanell Leigh, MD;  Location: [facility] INVASIVE CV LAB;  Service: Cardiovascular;  Laterality: N/A;   CARDIAC CATHETERIZATION N/A 07/29/2016   Procedure: Coronary Stent Intervention;  Surgeon: Avanell Leigh, MD;  [facility]: [facility];  Service: Cardiovascular;  Laterality: N/A;   CARPAL TUNNEL  RELEASE Bilateral    CORONARY ANGIOPLASTY WITH STENT PLACEMENT  07/29/2016   CORONARY ARTERY BYPASS GRAFT N/A 07/24/2020   Procedure: CORONARY ARTERY BYPASS GRAFTING (CABG), ON PUMP, TIMES FOUR, USING LEFT INTERNAL MAMMARY ARTERY AND ENDOSCOPICALLY HARVESTED RIGHT GREATER SAPHENOUS VEIN;  Surgeon: Hilarie Lovely, MD;  Location: [facility] OR;  Service: Open Heart Surgery;  Laterality: N/A;  FLOW TAC   CORONARY PRESSURE/FFR STUDY N/A 07/22/2020   Procedure: INTRAVASCULAR PRESSURE WIRE/FFR STUDY;  Surgeon: Arleen Lacer, MD;  Location: [facility] INVASIVE CV LAB;  Service: Cardiovascular;  Laterality: N/A;   CORONARY STENT INTERVENTION N/A 05/30/2019   Procedure: CORONARY STENT INTERVENTION;  Surgeon: Arty Binning, MD;  [facility]: [facility];  Service: Cardiovascular;  Laterality: N/A;   CORONARY STENT INTERVENTION N/A 11/02/2019   Procedure: CORONARY STENT INTERVENTION;  Surgeon: Wenona Hamilton, MD;  [facility];  Service: Cardiovascular;  Laterality: N/A;   ESOPHAGOGASTRODUODENOSCOPY N/A 09/22/2017   Procedure: ESOPHAGOGASTRODUODENOSCOPY (EGD);  Surgeon: Asencion Blacksmith, MD;  Location: [facility] ENDOSCOPY;  Service: Endoscopy;  Laterality: N/A;   KNEE ARTHROSCOPY Bilateral    "2 on left; 1 on the right"   LEFT HEART CATH AND CORONARY ANGIOGRAPHY N/A 11/02/2019   Procedure: LEFT HEART CATH AND CORONARY ANGIOGRAPHY;  Surgeon: Wenona Hamilton, MD;  [facility];  Service: Cardiovascular;  Laterality: N/A;   LEFT HEART CATH AND CORONARY ANGIOGRAPHY N/A 07/22/2020   Procedure: LEFT HEART CATH AND CORONARY ANGIOGRAPHY;  Surgeon: Arleen Lacer, MD;  Location: [facility] INVASIVE CV LAB;  Service: Cardiovascular;  Laterality: N/A;   LEFT HEART CATH AND CORS/GRAFTS ANGIOGRAPHY N/A 08/17/2021   Procedure: LEFT HEART CATH AND CORS/GRAFTS ANGIOGRAPHY;  Surgeon: Sammy Crisp, MD;  [facility]: [facility];  Service: Cardiovascular;  Laterality: N/A;   PERIPHERAL VASCULAR  INTERVENTION Left 10/11/2020  popliteal and SFA stent placement    PERIPHERAL VASCULAR INTERVENTION Left 10/10/2020   Procedure: PERIPHERAL VASCULAR INTERVENTION;  Surgeon: Richrd Char, MD;  Location: [facility] INVASIVE CV LAB;  Service: Cardiovascular;  Laterality: Left;   RIGHT/LEFT HEART CATH AND CORONARY ANGIOGRAPHY N/A 05/30/2019   Procedure: RIGHT/LEFT HEART CATH AND CORONARY ANGIOGRAPHY;  Surgeon: Arty Binning, MD;  [facility];  Service: Cardiovascular;  Laterality: N/A;   RIGHT/LEFT HEART CATH AND CORONARY/GRAFT ANGIOGRAPHY N/A 06/17/2022   Procedure: RIGHT/LEFT HEART CATH AND CORONARY/GRAFT ANGIOGRAPHY;  Surgeon: Millicent Ally, MD;  [facility]: [facility];  Service: Cardiovascular;  Laterality: N/A;   SHOULDER OPEN ROTATOR CUFF REPAIR Bilateral    TEE WITHOUT CARDIOVERSION N/A 07/24/2020   Procedure: TRANSESOPHAGEAL ECHOCARDIOGRAM (TEE);  Surgeon: Hilarie Lovely, MD;  Location: [facility] OR;  Service: Open Heart Surgery;  Laterality: N/A;    Family Hx:  Family History  Problem Relation Age of Onset   Hypertension Brother    Hypertension Father    Diabetes Other    Hyperlipidemia Other     Social History:  reports that he quit smoking about 3 years ago. His smoking use included cigarettes. He started smoking about 37 years ago. He has a 34 pack-year smoking history. He has quit using smokeless tobacco.  His smokeless tobacco use included snuff and chew. He reports current alcohol use. He reports that he does not use drugs.  Allergies:  Allergies  Allergen Reactions   Duloxetine Other (See Comments)    Headaches   Sulfa Antibiotics Other (See Comments)    Headaches     Medications: Prior to Admission medications   Medication Sig Start Date End Date Taking? Authorizing Provider  acetaminophen (TYLENOL) 500 MG tablet Take 1,000 mg by mouth 3 (three) times daily as needed for headache.   Yes [provider]  albuterol (VENTOLIN HFA) 108 (90  Base) MCG/ACT inhaler Inhale 2 puffs into the lungs every 6 (six) hours as needed for shortness of breath.   Yes [provider]  ALPRAZolam (XANAX) 1 MG tablet Take 1 tablet (1 mg total) by mouth 2 (two) times daily as needed. Patient taking differently: Take 1 mg by mouth at bedtime. May take an additional tablet twice a day as needed for anxiety 03/26/24  Yes   aspirin EC 81 MG tablet Take 81 mg by mouth in the morning. Swallow whole.   Yes [provider]  atorvastatin (LIPITOR) 80 MG tablet TAKE ONE TABLET (80 MG TOTAL) BY MOUTH DAILY AT 6PM (APPOINTMENT OVERDUE: NEEDS APPOINTMENT FOR FURTHER REFILLS PLEASE CALL OFFICE) 02/07/24  Yes Reyne Cave, Scott T, PA-C  buPROPion (WELLBUTRIN XL) 150 MG 24 hr tablet Take 150 mg by mouth in the morning and at bedtime.   Yes [provider]  carvedilol (COREG) 6.25 MG tablet TAKE ONE TABLET (6.25 MG TOTAL) BY MOUTH TWICE DAILY @ 9AM & 5PM 01/26/24  Yes Maudine Sos, MD  clopidogrel (PLAVIX) 75 MG tablet Take 1 tablet (75 mg total) by mouth daily. 03/21/24  Yes Maudine Sos, MD  doxycycline (VIBRA-TABS) 100 MG tablet Take 1 tablet (100 mg total) by mouth 2 (two) times daily. 03/23/24  Yes Timothy Ford, MD  escitalopram (LEXAPRO) 10 MG tablet Take 10 mg by mouth daily. 01/27/22  Yes [provider]  fluconazole (DIFLUCAN) 100 MG tablet Take 100 mg by mouth every Saturday.   Yes [provider]  fluticasone (FLONASE) 50 MCG/ACT nasal spray Place 2 sprays into  both nostrils daily.   Yes [provider]  furosemide (LASIX) 40 MG tablet Take 1 tablet (40 mg total) by mouth daily and as directed for 90 days. 03/26/24  Yes   icosapent Ethyl (VASCEPA) 1 g capsule TAKE TWO CAPSULES (2 G TOTAL) BY MOUTH TWICE DAILY APPOINTMENT OVERDUE: NEEDS APPOINTMENT FOR FURTHER REFILLS PLEASE CALL OFFICE 01/30/24  Yes Reyne Cave, Scott T, PA-C  insulin aspart (NOVOLOG) 100 UNIT/ML injection Inject 8 Units into the skin 3 (three) times daily  with meals. Hold if eats less than 50% of meals Patient taking differently: Inject 10 Units into the skin with breakfast, with lunch, and with evening meal. Hold if eats less than 50% of meals 01/14/24  Yes Adhikari, Corene Devonshire, MD  insulin glargine (LANTUS SOLOSTAR) 100 UNIT/ML Solostar Pen Inject 38 Units into the skin 2 (two) times daily. Patient taking differently: Inject 38-45 Units into the skin 2 (two) times daily. 01/14/24  Yes Adhikari, Amrit, MD  ketotifen (ZADITOR) 0.035 % ophthalmic solution Place 1 drop into the left eye 2 (two) times daily. 12/03/23  Yes Patel, Pranav M, MD  lidocaine (LIDODERM) 5 % Place 1 patch onto the skin daily.   Yes [provider]  loratadine (CLARITIN) 10 MG tablet Take 10 mg by mouth daily. 09/27/22  Yes [provider]  meloxicam (MOBIC) 15 MG tablet Take 15 mg by mouth daily. 03/23/24  Yes [provider]  naloxone (NARCAN) nasal spray 4 mg/0.1 mL Place 4 mg into the nose as needed (overdose).   Yes [provider]  nitroGLYCERIN (NITROSTAT) 0.4 MG SL tablet Place 1 tablet (0.4 mg total) under the tongue every 5 (five) minutes as needed. 10/08/22  Yes Gerald Kitty., NP  oxyCODONE (ROXICODONE) 15 MG immediate release tablet Take 1 tablet (15 mg total) by mouth every 4 (four) - 6 (six) hours for 30 days. 02/23/24  Yes   pantoprazole (PROTONIX) 40 MG tablet Take 1 tablet (40 mg total) by mouth daily. 03/26/24  Yes   prednisoLONE acetate (PRED FORTE) 1 % ophthalmic suspension Place 1 drop into the left eye 4 (four) times daily. 02/19/24  Yes [provider]  pregabalin (LYRICA) 100 MG capsule Take 100 mg by mouth 3 (three) times daily. 02/24/24  Yes [provider]  promethazine (PHENERGAN) 25 MG tablet Take 25 mg by mouth every 4 (four) hours as needed for nausea or vomiting. 09/10/22  Yes [provider]  ranolazine (RANEXA) 500 MG 12 hr tablet Take 1 tablet (500 mg total) by mouth 2 (two) times daily. 03/26/24   Yes   tamsulosin (FLOMAX) 0.4 MG CAPS capsule Take 1 capsule (0.4 mg total) by mouth daily. 01/14/24  Yes Adhikari, Amrit, MD  tiZANidine (ZANAFLEX) 4 MG tablet Take 4 mg by mouth 3 (three) times daily. 11/25/23  Yes [provider]    I have reviewed the patient's current medications.  Labs:  Results for orders placed or performed during the hospital encounter of 03/27/24 (from the past 48 hours)  Glucose, capillary     Status: Abnormal   Collection Time: 03/29/24  4:33 PM  Result Value Ref Range   Glucose-Capillary 379 (H) 70 - 99 mg/dL    Comment: Glucose reference range applies only to samples taken after fasting for at least 8 hours.  Glucose, capillary     Status: Abnormal   Collection Time: 03/29/24  9:06 PM  Result Value Ref Range   Glucose-Capillary 402 (H) 70 - 99 mg/dL  Comment: Glucose reference range applies only to samples taken after fasting for at least 8 hours.  Basic metabolic panel with GFR     Status: Abnormal   Collection Time: 03/30/24  3:23 AM  Result Value Ref Range   Sodium 136 135 - 145 mmol/L   Potassium 4.5 3.5 - 5.1 mmol/L   Chloride 103 98 - 111 mmol/L   CO2 24 22 - 32 mmol/L   Glucose, Bld 356 (H) 70 - 99 mg/dL    Comment: Glucose reference range applies only to samples taken after fasting for at least 8 hours.   BUN 42 (H) 6 - 20 mg/dL   Creatinine, Ser 1.61 (H) 0.61 - 1.24 mg/dL   Calcium 8.3 (L) 8.9 - 10.3 mg/dL   GFR, Estimated 37 (L) >60 mL/min    Comment: (NOTE) Calculated using the CKD-EPI Creatinine Equation (2021)    Anion gap 9 5 - 15    Comment: Performed at [facility], [address] [place], [name] [address]  Glucose, capillary     Status: Abnormal   Collection Time: 03/30/24  7:24 AM  Result Value Ref Range   Glucose-Capillary 375 (H) 70 - 99 mg/dL    Comment: Glucose reference range applies only to samples taken after fasting for at least 8 hours.  Glucose, capillary     Status: Abnormal   Collection Time:  03/30/24 11:25 AM  Result Value Ref Range   Glucose-Capillary 430 (H) 70 - 99 mg/dL    Comment: Glucose reference range applies only to samples taken after fasting for at least 8 hours.  Glucose, capillary     Status: Abnormal   Collection Time: 03/30/24  3:51 PM  Result Value Ref Range   Glucose-Capillary 318 (H) 70 - 99 mg/dL    Comment: Glucose reference range applies only to samples taken after fasting for at least 8 hours.  Glucose, capillary     Status: Abnormal   Collection Time: 03/30/24  8:37 PM  Result Value Ref Range   Glucose-Capillary 268 (H) 70 - 99 mg/dL    Comment: Glucose reference range applies only to samples taken after fasting for at least 8 hours.  Basic metabolic panel with GFR     Status: Abnormal   Collection Time: 03/31/24  3:24 AM  Result Value Ref Range   Sodium 134 (L) 135 - 145 mmol/L   Potassium 5.2 (H) 3.5 - 5.1 mmol/L   Chloride 102 98 - 111 mmol/L   CO2 20 (L) 22 - 32 mmol/L   Glucose, Bld 340 (H) 70 - 99 mg/dL    Comment: Glucose reference range applies only to samples taken after fasting for at least 8 hours.   BUN 53 (H) 6 - 20 mg/dL   Creatinine, Ser 5.40 (H) 0.61 - 1.24 mg/dL   Calcium 8.2 (L) 8.9 - 10.3 mg/dL   GFR, Estimated 31 (L) >60 mL/min    Comment: (NOTE) Calculated using the CKD-EPI Creatinine Equation (2021)    Anion gap 12 5 - 15    Comment: Performed at [facility], [address], [place], [name] [address]  Magnesium     Status: Abnormal   Collection Time: 03/31/24  3:24 AM  Result Value Ref Range   Magnesium 1.5 (L) 1.7 - 2.4 mg/dL    Comment: Performed at [facility], [address] [place], [name] [address]  Glucose, capillary     Status: Abnormal   Collection Time: 03/31/24  6:06 AM  Result Value Ref Range   Glucose-Capillary 401 (H) 70 - 99 mg/dL    Comment: Glucose reference range applies only to samples taken after fasting for at least 8 hours.  Glucose, capillary     Status: Abnormal   Collection  Time: 03/31/24 12:01 PM  Result Value Ref Range   Glucose-Capillary 276 (H) 70 - 99 mg/dL    Comment: Glucose reference range applies only to samples taken after fasting for at least 8 hours.  Urinalysis, Routine w reflex microscopic -Urine, Clean Catch     Status: Abnormal   Collection Time: 03/31/24  1:22 PM  Result Value Ref Range   Color, Urine YELLOW YELLOW   APPearance HAZY (A) CLEAR   Specific Gravity, Urine 1.016 1.005 - 1.030   pH 7.0 5.0 - 8.0   Glucose, UA >=500 (A) NEGATIVE mg/dL   Hgb urine dipstick NEGATIVE NEGATIVE   Bilirubin Urine NEGATIVE NEGATIVE   Ketones, ur NEGATIVE NEGATIVE mg/dL   Protein, ur >=119 (A) NEGATIVE mg/dL   Nitrite NEGATIVE NEGATIVE   Leukocytes,Ua LARGE (A) NEGATIVE   RBC / HPF 0-5 0 - 5 RBC/hpf   WBC, UA >50 0 - 5 WBC/hpf   Bacteria, UA FEW (A) NONE SEEN   Squamous Epithelial / HPF 0-5 0 - 5 /HPF   Mucus PRESENT    Amorphous Crystal PRESENT     Comment: Performed at [facility], [address], [place], [name] [address]     ROS:  A comprehensive review of systems was negative except for: Cardiovascular: positive for dyspnea, lower extremity edema, and orthopnea  Physical Exam: Vitals:   03/31/24 0800 03/31/24 1204  BP:  101/72  Pulse:  83  Resp:  19  Temp:  97.8 F (36.6 C)  SpO2: 95% 91%     General:  pleasant WM, NAD  HEENT:  PERRLA, EOMI, mucous membranes moist  Neck: positive for JVD Heart: RRR Lungs: mostly clear Abdomen: obese, non tender Extremities: pitting edema throughout-  bilat BKAs Skin: warm and dry Neuro: alert and non focal   Assessment/Plan: 53 year old WM with many medical issues including CKD and proteinuria-  now with A on CRF 1.Renal- baseline CKD with crt around 1.7 with nephrotic range proteinuria.  This could be due to DM or obesity related FSG.  This has been present long term.  Over the last 48 hours he has suffered A on CRF.  The things that I see is that his BP has been soft on flomax,  coreg and high dose lyrica.  Therefore I am concerned about a hemodynamic issue-  I have dec lyrica-  am going to place foley so will stop flomax-  coreg is low dose so I left that alone but did add on midodrine to raise BP.  I have told him that I dont want him to take mobic-   he agrees.  The other issue is that he has pyuria and LUTS.  I am going to send urine for culture but also treat with rocephin-  then place foley given his LUTS.  I think he was supposed to follow with urology as OP but never did.  Therefore there could be an element of BOO and UTI 2. Hypertension/volume  - volume overloaded and symptomatic-   resume lasix-  but change meds as above to see if we can get him to respond better to the diuretic 3. LUTS-  place foley 4. Anemia  - had not  been an issue 5. Hyperkalemia-  mild-  should improve if we can get him to [employer] 03/31/2024, 2:07 PM

## 2024-03-31 NOTE — Progress Notes (Signed)
 1610: blood sugar 401. Notified Dr. [facility].  [id number]: Per Dr. [facility], Give 20 units of Novolog before breakfast.

## 2024-03-31 NOTE — Progress Notes (Addendum)
 PROGRESS NOTE    Joseph [name]  WUJ:[id number] DOB: 1971-05-02 DOA: 03/27/2024 PCP: Berneda Bridges, MD   Brief Narrative: Joseph [name] is a 53 y.o. male with a history of morbid obesity, bilateral BKA's, diabetes mellitus type 2, hypertension, chronic heart failure, COPD, CKD 3B.  Patient presented secondary to progressively worsening dyspnea with concern for acute heart failure. Lasix IV started without significant improvement in symptoms.   Assessment/Plan:  Possible acute on chronic HFpEF Patient started on Lasix IV diuresis. No significant improvement in symptoms since admission. Transthoracic Echocardiogram ordered and is significant for normal LVEF with no RV dysfunction. Diuresis held secondary to worsening creatinine. -Hold Lasix  Dyspnea Unclear if this is related to heart failure; no improvement with diuresis so far. D-dimer elevated; V/Q scan negative and CT chest unremarkable for etiology.  Chest pain Somewhat atypical but patient has a history of unstable angina. Current episodes are consistent with unstable angina. Troponin mildly elevated at 37 with negative delta of 33. Significant cardiac history. Patient with improvement on nitroglycerin. Patient is on Ranexa as an outpatient. Transthoracic Echocardiogram without new pathology. -Continue Ranexa  CAD History of CABG x4 in 2021.  Diabetes mellitus type 2 Uncontrolled with hyperglycemia. Hemoglobin A1C of 8.1%. Currently uncontrolled. -Increase to Semglee 40 units BID and continue SSI  Primary hypertension Antihypertensives held on admission. -Continue Coreg  Morbid obesity, class III Estimated body mass index is 74.44 kg/m as calculated from the following:   Height as of this encounter: 5\' 1"  (1.549 m).   Weight as of this encounter: 178.7 kg.  Anxiety -Continue Lexapro and Wellbutrin  Hyperlipidemia -Continue Lipitor  Hyperkalemia In setting of AKI. Mild. -Recheck potassium and treat if still  elevated  AKI on CKD stage IIIb Baseline creatinine of 1.6-1.8. Creatinine continues to trend up. Urine output remains up. Creatinine worsening and is up to 2.49 today. Creatinine worsened with Lasix, however patient does have dependent edema. -Urinalysis -Nephrology consult  Abnormal aortic valve Patient noted to have a functional bicuspid valve with two fused valves noted. Patient will need cardiology follow-up.   DVT prophylaxis: Lovenox Code Status:   Code Status: Full Code Family Communication: None at bedside Disposition Plan: Discharge home pending improvement of kidney function   Consultants:  None  Procedures:  Transthoracic Echocardiogram  Antimicrobials: None    Subjective: No specific concerns today. Urinating well.  Objective: BP 101/72 (BP Location: Right Arm)   Pulse 83   Temp 97.8 F (36.6 C) (Oral)   Resp 19   Ht 5\' 1"  (1.549 m) Comment: BKA Bi-lateral  Wt (!) 178.7 kg   SpO2 91%   BMI 74.44 kg/m   Examination:  General exam: Appears calm and comfortable Respiratory system: Clear to auscultation. Respiratory effort normal. Cardiovascular system: S1 & S2 heard, RRR.  Gastrointestinal system: Abdomen is nondistended, soft and nontender. Normal bowel sounds heard. Central nervous system: Alert and oriented. No focal neurological deficits. Musculoskeletal: BLE 1+ pitting edema of thighs. No calf tenderness Skin: No cyanosis. No rashes Psychiatry: Judgement and insight appear normal. Mood & affect appropriate.    Data Reviewed: I have personally reviewed following labs and imaging studies   Last CBC Lab Results  Component Value Date   WBC 7.7 03/28/2024   HGB 12.3 (L) 03/28/2024   HCT 38.3 (L) 03/28/2024   MCV 83.3 03/28/2024   MCH 26.7 03/28/2024   RDW 14.6 03/28/2024   PLT 225 03/28/2024     Last metabolic panel Lab Results  Component Value Date   GLUCOSE 340 (H) 03/31/2024   NA 134 (L) 03/31/2024   K 5.2 (H) 03/31/2024   CL 102  03/31/2024   CO2 20 (L) 03/31/2024   BUN 53 (H) 03/31/2024   CREATININE 2.49 (H) 03/31/2024   GFRNONAA 31 (L) 03/31/2024   CALCIUM 8.2 (L) 03/31/2024   PHOS 5.2 (H) 03/28/2024   PROT 5.8 (L) 01/09/2024   ALBUMIN 1.7 (L) 01/09/2024   LABGLOB 2.7 04/27/2023   AGRATIO 0.9 04/27/2023   BILITOT 0.5 01/09/2024   ALKPHOS 153 (H) 01/09/2024   AST 17 01/09/2024   ALT 21 01/09/2024   ANIONGAP 12 03/31/2024     Creatinine Clearance: Estimated Creatinine Clearance: 51.7 mL/min (A) (by C-G formula based on SCr of 2.49 mg/dL (H)).  No results found for this or any previous visit (from the past 240 hours).    Radiology Studies: CT CHEST WO CONTRAST Result Date: 03/31/2024 CLINICAL DATA:  Leg swelling.  Respiratory illness EXAM: CT CHEST WITHOUT CONTRAST TECHNIQUE: Multidetector CT imaging of the chest was performed following the standard protocol without IV contrast. RADIATION DOSE REDUCTION: This exam was performed according to the departmental dose-optimization program which includes automated exposure control, adjustment of the mA and/or kV according to patient size and/or use of iterative reconstruction technique. COMPARISON:  Chest radiograph 03/30/2024 FINDINGS: Cardiovascular: Post CABG.  No pericardial fluid. Mediastinum/Nodes: No axillary or supraclavicular adenopathy. No mediastinal or hilar adenopathy. No pericardial fluid. Esophagus normal. Lungs/Pleura: No pulmonary infarction. No pneumonia. No pleural fluid. No pneumothorax Limited view of the liver, kidneys, pancreas are unremarkable. Upper Abdomen: Limited view of the liver, kidneys, pancreas are unremarkable. Normal adrenal glands. Musculoskeletal: No aggressive osseous lesion. Mild symmetric gynecomastia. IMPRESSION: 1. No acute findings in the chest. 2. Post CABG. Electronically Signed   By: Deboraha Fallow M.D.   On: 03/31/2024 11:35   NM Pulmonary Perfusion Result Date: 03/30/2024 CLINICAL DATA:  Chest pain.  Concern for pulmonary  embolism. EXAM: NUCLEAR MEDICINE PERFUSION LUNG SCAN TECHNIQUE: Perfusion images were obtained in multiple projections after intravenous injection of radiopharmaceutical. RADIOPHARMACEUTICALS:  4.3 mCi Tc-16m MAA COMPARISON:  None Available. FINDINGS: No wedge-shaped peripheral perfusion defect within LEFT or RIGHT lung to suggest acute pulmonary embolism. Normal perfusion pattern. IMPRESSION: No evidence acute pulmonary embolism. Electronically Signed   By: Deboraha Fallow M.D.   On: 03/30/2024 17:57   DG CHEST PORT 1 VIEW Result Date: 03/30/2024 CLINICAL DATA:  [id number] Dyspnea [id number] EXAM: PORTABLE CHEST 1 VIEW COMPARISON:  March 27, 2024, January 07, 2024 FINDINGS: Elevation of the right hemidiaphragm. No focal airspace consolidation, pleural effusion, or pneumothorax. Sternotomy wires. Mild cardiomegaly. No acute fracture or destructive lesion. IMPRESSION: Mild cardiomegaly.  No pneumonia or pulmonary edema. Electronically Signed   By: Rance Burrows M.D.   On: 03/30/2024 15:01      LOS: 3 days    Aneita Keens, MD [facility] 03/31/2024, 1:36 PM   If 7PM-7AM, please contact night-coverage [email or web address]

## 2024-04-01 DIAGNOSIS — R0600 Dyspnea, unspecified: Secondary | ICD-10-CM | POA: Diagnosis not present

## 2024-04-01 DIAGNOSIS — I200 Unstable angina: Secondary | ICD-10-CM | POA: Diagnosis not present

## 2024-04-01 DIAGNOSIS — N179 Acute kidney failure, unspecified: Secondary | ICD-10-CM | POA: Diagnosis not present

## 2024-04-01 LAB — RENAL FUNCTION PANEL
Albumin: 2 g/dL — ABNORMAL LOW (ref 3.5–5.0)
Anion gap: 9 (ref 5–15)
BUN: 58 mg/dL — ABNORMAL HIGH (ref 6–20)
CO2: 24 mmol/L (ref 22–32)
Calcium: 8.4 mg/dL — ABNORMAL LOW (ref 8.9–10.3)
Chloride: 100 mmol/L (ref 98–111)
Creatinine, Ser: 2.35 mg/dL — ABNORMAL HIGH (ref 0.61–1.24)
GFR, Estimated: 33 mL/min — ABNORMAL LOW (ref >60)
Glucose, Bld: 298 mg/dL — ABNORMAL HIGH (ref 70–99)
Phosphorus: 3.5 mg/dL (ref 2.5–4.6)
Potassium: 4.6 mmol/L (ref 3.5–5.1)
Sodium: 133 mmol/L — ABNORMAL LOW (ref 135–145)

## 2024-04-01 LAB — GLUCOSE, CAPILLARY
Glucose-Capillary: 265 mg/dL — ABNORMAL HIGH (ref 70–99)
Glucose-Capillary: 274 mg/dL — ABNORMAL HIGH (ref 70–99)
Glucose-Capillary: 232 mg/dL — ABNORMAL HIGH (ref 70–99)
Glucose-Capillary: 280 mg/dL — ABNORMAL HIGH (ref 70–99)

## 2024-04-01 LAB — CBC
HCT: 33.7 % — ABNORMAL LOW (ref 39.0–52.0)
Hemoglobin: 11.2 g/dL — ABNORMAL LOW (ref 13.0–17.0)
MCH: 27.1 pg (ref 26.0–34.0)
MCHC: 33.2 g/dL (ref 30.0–36.0)
MCV: 81.4 fL (ref 80.0–100.0)
Platelets: 188 10*3/uL (ref 150–400)
RBC: 4.14 MIL/uL — ABNORMAL LOW (ref 4.22–5.81)
RDW: 14.7 % (ref 11.5–15.5)
WBC: 7.3 10*3/uL (ref 4.0–10.5)
nRBC: 0 % (ref 0.0–0.2)

## 2024-04-01 LAB — MAGNESIUM: Magnesium: 2 mg/dL (ref 1.7–2.4)

## 2024-04-01 NOTE — Progress Notes (Signed)
 Subjective:  3750 UOP on lasix 80 q12 -  crt down a little , BUN up a little-  urine seems to be clearing with foley Objective Vital signs in last 24 hours: Vitals:   04/01/24 0000 04/01/24 0006 04/01/24 0432 04/01/24 0838  BP:  131/72 (!) 131/92 107/78  Pulse: 82 79 82 86  Resp: 20 14 13 15   Temp:  98.2 F (36.8 C) 98.4 F (36.9 C) 97.6 F (36.4 C)  TempSrc:  Oral Oral Oral  SpO2: 94% 98% 98% 96%  Weight:   (!) 181.8 kg   Height:       Weight change: 3.1 kg  Intake/Output Summary (Last 24 hours) at 04/01/2024 1006 Last data filed at 04/01/2024 0800 Gross per 24 hour  Intake 2130 ml  Output 4800 ml  Net -2670 ml    Assessment/Plan: 53 year old WM with many medical issues including CKD and proteinuria-  now with A on CRF 1.Renal- baseline CKD with crt around 1.7 with nephrotic range proteinuria.  This could be due to DM or obesity related FSG.  This has been present long term.  He has been seen at [facility] mostly by our extenders-  he has been attempted to start on ARB and SGLT2 but pt for whatever reason has not been able to follow through.  Over the 48 hours prior to consult he suffered A on CRF.   his BP has been soft on flomax, coreg and high dose lyrica.  Therefore I am concerned about a hemodynamic issue-   have dec lyrica-  placed foley  stopped flomax-  coreg is low dose so I left that alone but did add on midodrine to raise BP.  No more mobic-   he agrees.  The other issue is that he has pyuria and LUTS.  I am going to send urine for culture but also treat with rocephin-  then place foley given his LUTS.  I think he was supposed to follow with urology as OP but never did.  Therefore there could be an element of BOO and UTI 2. Hypertension/volume  - volume overloaded and symptomatic-   resume lasix-  but change meds as above to see if we can get him to respond better to the diuretic-  so far OK-  96045 of urine-  keep dose the same-  have a ways to go  3. LUTS-  place foley 4. Anemia   - had not been an issue 5. Hyperkalemia-  mild-  resolved     Reche [place]    Labs: Basic Metabolic Panel: Recent Labs  Lab 03/28/24 0500 03/29/24 0559 03/30/24 0323 03/31/24 0324 03/31/24 1446 04/01/24 0315  NA 138   < > 136 134*  --  133*  K 4.2   < > 4.5 5.2* 5.2* 4.6  CL 105   < > 103 102  --  100  CO2 24   < > 24 20*  --  24  GLUCOSE 191*   < > 356* 340*  --  298*  BUN 30*   < > 42* 53*  --  58*  CREATININE 1.71*   < > 2.11* 2.49*  --  2.35*  CALCIUM 8.7*   < > 8.3* 8.2*  --  8.4*  PHOS 5.2*  --   --   --   --  3.5   < > = values in this interval not displayed.   Liver Function Tests: Recent Labs  Lab 04/01/24 0315  ALBUMIN 2.0*   No results for input(s): "LIPASE", "AMYLASE" in the last 168 hours. No results for input(s): "AMMONIA" in the last 168 hours. CBC: Recent Labs  Lab 03/27/24 2210 03/28/24 0500 04/01/24 0315  WBC 7.8 7.7 7.3  NEUTROABS 4.1  --   --   HGB 13.0 12.3* 11.2*  HCT 40.4 38.3* 33.7*  MCV 82.6 83.3 81.4  PLT 267 225 188   Cardiac Enzymes: No results for input(s): "CKTOTAL", "CKMB", "CKMBINDEX", "TROPONINI" in the last 168 hours. CBG: Recent Labs  Lab 03/31/24 0606 03/31/24 1201 03/31/24 1601 03/31/24 2117 04/01/24 0634  GLUCAP 401* 276* 290* 232* 265*    Iron Studies: No results for input(s): "IRON", "TIBC", "TRANSFERRIN", "FERRITIN" in the last 72 hours. Studies/Results: CT CHEST WO CONTRAST Result Date: 03/31/2024 CLINICAL DATA:  Leg swelling.  Respiratory illness EXAM: CT CHEST WITHOUT CONTRAST TECHNIQUE: Multidetector CT imaging of the chest was performed following the standard protocol without IV contrast. RADIATION DOSE REDUCTION: This exam was performed according to the departmental dose-optimization program which includes automated exposure control, adjustment of the mA and/or kV according to patient size and/or use of iterative reconstruction technique. COMPARISON:  Chest radiograph 03/30/2024 FINDINGS:  Cardiovascular: Post CABG.  No pericardial fluid. Mediastinum/Nodes: No axillary or supraclavicular adenopathy. No mediastinal or hilar adenopathy. No pericardial fluid. Esophagus normal. Lungs/Pleura: No pulmonary infarction. No pneumonia. No pleural fluid. No pneumothorax Limited view of the liver, kidneys, pancreas are unremarkable. Upper Abdomen: Limited view of the liver, kidneys, pancreas are unremarkable. Normal adrenal glands. Musculoskeletal: No aggressive osseous lesion. Mild symmetric gynecomastia. IMPRESSION: 1. No acute findings in the chest. 2. Post CABG. Electronically Signed   By: Deboraha Fallow M.D.   On: 03/31/2024 11:35   NM Pulmonary Perfusion Result Date: 03/30/2024 CLINICAL DATA:  Chest pain.  Concern for pulmonary embolism. EXAM: NUCLEAR MEDICINE PERFUSION LUNG SCAN TECHNIQUE: Perfusion images were obtained in multiple projections after intravenous injection of radiopharmaceutical. RADIOPHARMACEUTICALS:  4.3 mCi Tc-49m MAA COMPARISON:  None Available. FINDINGS: No wedge-shaped peripheral perfusion defect within LEFT or RIGHT lung to suggest acute pulmonary embolism. Normal perfusion pattern. IMPRESSION: No evidence acute pulmonary embolism. Electronically Signed   By: Deboraha Fallow M.D.   On: 03/30/2024 17:57   DG CHEST PORT 1 VIEW Result Date: 03/30/2024 CLINICAL DATA:  [id number] Dyspnea [id number] EXAM: PORTABLE CHEST 1 VIEW COMPARISON:  March 27, 2024, January 07, 2024 FINDINGS: Elevation of the right hemidiaphragm. No focal airspace consolidation, pleural effusion, or pneumothorax. Sternotomy wires. Mild cardiomegaly. No acute fracture or destructive lesion. IMPRESSION: Mild cardiomegaly.  No pneumonia or pulmonary edema. Electronically Signed   By: Rance Burrows M.D.   On: 03/30/2024 15:01   Medications: Infusions:  cefTRIAXone (ROCEPHIN)  IV Stopped (03/31/24 1854)    Scheduled Medications:  aspirin EC  81 mg Oral Daily   atorvastatin  80 mg Oral Daily   carvedilol  6.25  mg Oral BID WC   clopidogrel  75 mg Oral Daily   enoxaparin (LOVENOX) injection  80 mg Subcutaneous Daily   escitalopram  10 mg Oral Daily   fluticasone  2 spray Each Nare Daily   furosemide  80 mg Intravenous Q12H   insulin aspart  0-20 Units Subcutaneous TID WC   insulin glargine-yfgn  40 Units Subcutaneous BID   midodrine  10 mg Oral BID WC   pantoprazole  40 mg Oral Daily   prednisoLONE acetate  1 drop Left Eye QID   pregabalin  100 mg Oral BID   ranolazine  500 mg Oral BID   tamsulosin  0.4 mg Oral Daily    have reviewed scheduled and prn medications.  Physical Exam: General: NAD-  friendly affect Heart: RRR Lungs: mostly clear Abdomen: distended, obese, non tender Extremities: pitting edema throughout    04/01/2024,10:06 AM  LOS: 4 days

## 2024-04-01 NOTE — Progress Notes (Addendum)
 PROGRESS NOTE    Joseph [name]  JYN:[id number] DOB: 1971/05/31 DOA: 03/27/2024 PCP: Berneda Bridges, MD   Brief Narrative: Joseph [name] is a 53 y.o. male with a history of morbid obesity, bilateral BKA's, diabetes mellitus type 2, hypertension, chronic heart failure, COPD, CKD 3B.  Patient presented secondary to progressively worsening dyspnea with concern for acute heart failure. Lasix IV started without significant improvement in symptoms. AKI developed. Nephrology consulted.   Assessment/Plan:  Possible acute on chronic HFpEF Patient started on Lasix IV diuresis. No significant improvement in symptoms since admission. Transthoracic Echocardiogram ordered and is significant for normal LVEF with no RV dysfunction. Diuresis held secondary to worsening creatinine. Lasix restarted by Nephrology.  Dyspnea Unclear if this is related to heart failure; no improvement with diuresis so far. D-dimer elevated; V/Q scan negative and CT chest unremarkable for etiology.  Chest pain Somewhat atypical but patient has a history of unstable angina. Current episodes are consistent with unstable angina. Troponin mildly elevated at 37 with negative delta of 33. Significant cardiac history. Patient with improvement on nitroglycerin. Patient is on Ranexa as an outpatient. Transthoracic Echocardiogram without new pathology. -Continue Ranexa  CAD History of CABG x4 in 2021.  Diabetes mellitus type 2 Uncontrolled with hyperglycemia. Hemoglobin A1C of 8.1%. Currently uncontrolled. -Increase to Semglee 40 units BID and continue SSI  Primary hypertension Antihypertensives held on admission. -Continue Coreg  Morbid obesity, class III Estimated body mass index is 75.73 kg/m as calculated from the following:   Height as of this encounter: 5\' 1"  (1.549 m).   Weight as of this encounter: 181.8 kg.  Anxiety -Continue Lexapro and Wellbutrin  Hyperlipidemia -Continue Lipitor  Hyperkalemia In setting of  AKI. Mild. Resolved.  AKI on CKD stage IIIb Baseline creatinine of 1.6-1.8. Creatinine continues to trend up. Urine output remains up. Creatinine worsening and is up to 2.49. Creatinine worsened with Lasix, however patient does have dependent edema. Foley catheter placed per nephrology recommendations on 4/12. -Nephrology recommendations: Lasix IV  Pyuria Concern for possible UTI. Urine culture obtained and empiric Ceftriaxone started.  Abnormal aortic valve Patient noted to have a functional bicuspid valve with two fused valves noted. Patient will need cardiology follow-up.   DVT prophylaxis: Lovenox Code Status:   Code Status: Full Code Family Communication: None at bedside Disposition Plan: Discharge home pending improvement of kidney function   Consultants:  None  Procedures:  Transthoracic Echocardiogram  Antimicrobials: None    Subjective: Still with baseline dyspnea.  Objective: BP 107/78 (BP Location: Right Arm)   Pulse 86   Temp 97.6 F (36.4 C) (Oral)   Resp 15   Ht 5\' 1"  (1.549 m) Comment: BKA Bi-lateral  Wt (!) 181.8 kg   SpO2 96%   BMI 75.73 kg/m   Examination:  General exam: Appears calm and comfortable Respiratory system: Clear to auscultation. Respiratory effort normal. Cardiovascular system: S1 & S2 heard, RRR.  Gastrointestinal system: Abdomen is nondistended, soft and nontender. Normal bowel sounds heard. Central nervous system: Alert and oriented. No focal neurological deficits. Musculoskeletal: BLE pitting edema of thighs. Skin: No cyanosis. No rashes Psychiatry: Judgement and insight appear normal. Mood & affect appropriate.    Data Reviewed: I have personally reviewed following labs and imaging studies   Last CBC Lab Results  Component Value Date   WBC 7.3 04/01/2024   HGB 11.2 (L) 04/01/2024   HCT 33.7 (L) 04/01/2024   MCV 81.4 04/01/2024   MCH 27.1 04/01/2024   RDW 14.7 04/01/2024  PLT 188 04/01/2024     Last metabolic  panel Lab Results  Component Value Date   GLUCOSE 298 (H) 04/01/2024   NA 133 (L) 04/01/2024   K 4.6 04/01/2024   CL 100 04/01/2024   CO2 24 04/01/2024   BUN 58 (H) 04/01/2024   CREATININE 2.35 (H) 04/01/2024   GFRNONAA 33 (L) 04/01/2024   CALCIUM 8.4 (L) 04/01/2024   PHOS 3.5 04/01/2024   PROT 5.8 (L) 01/09/2024   ALBUMIN 2.0 (L) 04/01/2024   LABGLOB 2.7 04/27/2023   AGRATIO 0.9 04/27/2023   BILITOT 0.5 01/09/2024   ALKPHOS 153 (H) 01/09/2024   AST 17 01/09/2024   ALT 21 01/09/2024   ANIONGAP 9 04/01/2024     Creatinine Clearance: Estimated Creatinine Clearance: 55.4 mL/min (A) (by C-G formula based on SCr of 2.35 mg/dL (H)).  No results found for this or any previous visit (from the past 240 hours).    Radiology Studies: CT CHEST WO CONTRAST Result Date: 03/31/2024 CLINICAL DATA:  Leg swelling.  Respiratory illness EXAM: CT CHEST WITHOUT CONTRAST TECHNIQUE: Multidetector CT imaging of the chest was performed following the standard protocol without IV contrast. RADIATION DOSE REDUCTION: This exam was performed according to the departmental dose-optimization program which includes automated exposure control, adjustment of the mA and/or kV according to patient size and/or use of iterative reconstruction technique. COMPARISON:  Chest radiograph 03/30/2024 FINDINGS: Cardiovascular: Post CABG.  No pericardial fluid. Mediastinum/Nodes: No axillary or supraclavicular adenopathy. No mediastinal or hilar adenopathy. No pericardial fluid. Esophagus normal. Lungs/Pleura: No pulmonary infarction. No pneumonia. No pleural fluid. No pneumothorax Limited view of the liver, kidneys, pancreas are unremarkable. Upper Abdomen: Limited view of the liver, kidneys, pancreas are unremarkable. Normal adrenal glands. Musculoskeletal: No aggressive osseous lesion. Mild symmetric gynecomastia. IMPRESSION: 1. No acute findings in the chest. 2. Post CABG. Electronically Signed   By: Deboraha Fallow M.D.   On:  03/31/2024 11:35   NM Pulmonary Perfusion Result Date: 03/30/2024 CLINICAL DATA:  Chest pain.  Concern for pulmonary embolism. EXAM: NUCLEAR MEDICINE PERFUSION LUNG SCAN TECHNIQUE: Perfusion images were obtained in multiple projections after intravenous injection of radiopharmaceutical. RADIOPHARMACEUTICALS:  4.3 mCi Tc-17m MAA COMPARISON:  None Available. FINDINGS: No wedge-shaped peripheral perfusion defect within LEFT or RIGHT lung to suggest acute pulmonary embolism. Normal perfusion pattern. IMPRESSION: No evidence acute pulmonary embolism. Electronically Signed   By: Deboraha Fallow M.D.   On: 03/30/2024 17:57   DG CHEST PORT 1 VIEW Result Date: 03/30/2024 CLINICAL DATA:  [id number] Dyspnea [id number] EXAM: PORTABLE CHEST 1 VIEW COMPARISON:  March 27, 2024, January 07, 2024 FINDINGS: Elevation of the right hemidiaphragm. No focal airspace consolidation, pleural effusion, or pneumothorax. Sternotomy wires. Mild cardiomegaly. No acute fracture or destructive lesion. IMPRESSION: Mild cardiomegaly.  No pneumonia or pulmonary edema. Electronically Signed   By: Rance Burrows M.D.   On: 03/30/2024 15:01      LOS: 4 days    Aneita Keens, MD [facility] 04/01/2024, 9:23 AM   If 7PM-7AM, please contact night-coverage [email or web address]

## 2024-04-02 DIAGNOSIS — N179 Acute kidney failure, unspecified: Secondary | ICD-10-CM | POA: Diagnosis not present

## 2024-04-02 DIAGNOSIS — I200 Unstable angina: Secondary | ICD-10-CM | POA: Diagnosis not present

## 2024-04-02 DIAGNOSIS — R0600 Dyspnea, unspecified: Secondary | ICD-10-CM | POA: Diagnosis not present

## 2024-04-02 LAB — RENAL FUNCTION PANEL
Albumin: 1.9 g/dL — ABNORMAL LOW (ref 3.5–5.0)
Anion gap: 10 (ref 5–15)
BUN: 61 mg/dL — ABNORMAL HIGH (ref 6–20)
CO2: 24 mmol/L (ref 22–32)
Calcium: 8.2 mg/dL — ABNORMAL LOW (ref 8.9–10.3)
Chloride: 100 mmol/L (ref 98–111)
Creatinine, Ser: 2.44 mg/dL — ABNORMAL HIGH (ref 0.61–1.24)
GFR, Estimated: 31 mL/min — ABNORMAL LOW (ref >60)
Glucose, Bld: 309 mg/dL — ABNORMAL HIGH (ref 70–99)
Phosphorus: 3.8 mg/dL (ref 2.5–4.6)
Potassium: 4.2 mmol/L (ref 3.5–5.1)
Sodium: 134 mmol/L — ABNORMAL LOW (ref 135–145)

## 2024-04-02 LAB — GLUCOSE, CAPILLARY
Glucose-Capillary: 278 mg/dL — ABNORMAL HIGH (ref 70–99)
Glucose-Capillary: 274 mg/dL — ABNORMAL HIGH (ref 70–99)
Glucose-Capillary: 263 mg/dL — ABNORMAL HIGH (ref 70–99)
Glucose-Capillary: 244 mg/dL — ABNORMAL HIGH (ref 70–99)
Glucose-Capillary: 296 mg/dL — ABNORMAL HIGH (ref 70–99)

## 2024-04-02 LAB — URINE CULTURE: Culture: >=100000 — AB

## 2024-04-02 MED ORDER — INSULIN ASPART 100 UNIT/ML IJ SOLN
10.0000 [IU] | Freq: Three times a day (TID) | INTRAMUSCULAR | Status: DC
  Administered 2024-04-02 – 2024-04-03 (×2): 10 [IU] via SUBCUTANEOUS

## 2024-04-02 NOTE — Inpatient Diabetes Management (Signed)
 Inpatient Diabetes Program Recommendations  AACE/ADA: New Consensus Statement on Inpatient Glycemic Control (2015)  Target Ranges:  Prepandial:   less than 140 mg/dL      Peak postprandial:   less than 180 mg/dL (1-2 hours)      Critically ill patients:  140 - 180 mg/dL   Lab Results  Component Value Date   GLUCAP 278 (H) 04/02/2024   HGBA1C 8.1 (H) 01/07/2024    Review of Glycemic Control  Latest Reference Range & Units 03/31/24 21:17 04/01/24 06:34 04/01/24 12:02 04/01/24 16:14 04/01/24 21:08 04/02/24 06:38  Glucose-Capillary 70 - 99 mg/dL 643 (H) 329 (H) 518 (H) 232 (H) 280 (H) 278 (H)  (H): Data is abnormally high Diabetes history: DM2 Outpatient Diabetes medications: Lantus 45 units BID, Novolog 0-20 units TID Current orders for Inpatient glycemic control: Semglee 40 units BID, Novolog 0-20 units TID and 0-5 units QHS   Inpatient Diabetes Program Recommendations:     Semglee 45 units BID Novolog 6 units TID with meals if he consumes at least 50%  Thanks, Marjo Sievert, MSN, RNC-OB Diabetes Coordinator [phone] (8a-5p)

## 2024-04-02 NOTE — Progress Notes (Signed)
   04/02/24 2300  BiPAP/CPAP/SIPAP  $ Non-Invasive Home Ventilator  Subsequent  Reason BIPAP/CPAP not in use Non-compliant (pt refused. States he can't tolerate. Advised to call if he changes his mind)  Device Plugged into [employer] Power Outlet Yes

## 2024-04-02 NOTE — Progress Notes (Signed)
 Patient ID: Joseph [name], male   DOB: 05/13/1971, 53 y.o.   MRN: [id number] S: No new complaints.   STill with low bp. O:BP 93/77   Pulse 73   Temp 98 F (36.7 C) (Oral)   Resp 16   Ht 5\' 1"  (1.549 m) Comment: BKA Bi-lateral  Wt (!) 180.7 kg   SpO2 91%   BMI 75.27 kg/m   Intake/Output Summary (Last 24 hours) at 04/02/2024 1104 Last data filed at 04/02/2024 0813 Gross per 24 hour  Intake 540 ml  Output 4350 ml  Net -3810 ml   Intake/Output: I/O last 3 completed shifts: In: 2560 [P.O.:2460; IV Piggyback:100] Out: 6350 [Urine:6350]  Intake/Output this shift:  Total I/O In: -  Out: 2550 [Urine:2550] Weight change: -1.1 kg Gen: NAD CVS: RRR Resp:CTA Abd: +BS, soft, NT/ND  Ext: s/p bilateral BKA's with edema of stumps  Recent Labs  Lab 03/27/24 2210 03/28/24 0500 03/29/24 0559 03/30/24 0323 03/31/24 0324 03/31/24 1446 04/01/24 0315 04/02/24 0315  NA 139 138 136 136 134*  --  133* 134*  K 4.3 4.2 4.2 4.5 5.2* 5.2* 4.6 4.2  CL 104 105 103 103 102  --  100 100  CO2 27 24 24 24  20*  --  24 24  GLUCOSE 199* 191* 342* 356* 340*  --  298* 309*  BUN 27* 30* 32* 42* 53*  --  58* 61*  CREATININE 1.68* 1.71* 1.78* 2.11* 2.49*  --  2.35* 2.44*  ALBUMIN  --   --   --   --   --   --  2.0* 1.9*  CALCIUM 9.1 8.7* 8.7* 8.3* 8.2*  --  8.4* 8.2*  PHOS  --  5.2*  --   --   --   --  3.5 3.8   Liver Function Tests: Recent Labs  Lab 04/01/24 0315 04/02/24 0315  ALBUMIN 2.0* 1.9*   No results for input(s): "LIPASE", "AMYLASE" in the last 168 hours. No results for input(s): "AMMONIA" in the last 168 hours. CBC: Recent Labs  Lab 03/27/24 2210 03/28/24 0500 04/01/24 0315  WBC 7.8 7.7 7.3  NEUTROABS 4.1  --   --   HGB 13.0 12.3* 11.2*  HCT 40.4 38.3* 33.7*  MCV 82.6 83.3 81.4  PLT 267 225 188   Cardiac Enzymes: No results for input(s): "CKTOTAL", "CKMB", "CKMBINDEX", "TROPONINI" in the last 168 hours. CBG: Recent Labs  Lab 04/01/24 0634 04/01/24 1202 04/01/24 1614  04/01/24 2108 04/02/24 0638  GLUCAP 265* 274* 232* 280* 278*    Iron Studies: No results for input(s): "IRON", "TIBC", "TRANSFERRIN", "FERRITIN" in the last 72 hours. Studies/Results: No results found.  aspirin EC  81 mg Oral Daily   atorvastatin  80 mg Oral Daily   carvedilol  6.25 mg Oral BID WC   clopidogrel  75 mg Oral Daily   enoxaparin (LOVENOX) injection  80 mg Subcutaneous Daily   escitalopram  10 mg Oral Daily   fluticasone  2 spray Each Nare Daily   furosemide  80 mg Intravenous Q12H   insulin aspart  0-20 Units Subcutaneous TID WC   insulin glargine-yfgn  40 Units Subcutaneous BID   midodrine  10 mg Oral BID WC   pantoprazole  40 mg Oral Daily   prednisoLONE acetate  1 drop Left Eye QID   pregabalin  100 mg Oral BID   ranolazine  500 mg Oral BID   tamsulosin  0.4 mg Oral Daily    BMET  Component Value Date/Time   NA 134 (L) 04/02/2024 0315   NA 138 04/25/2023 1520   K 4.2 04/02/2024 0315   CL 100 04/02/2024 0315   CO2 24 04/02/2024 0315   GLUCOSE 309 (H) 04/02/2024 0315   BUN 61 (H) 04/02/2024 0315   BUN 40 (H) 04/25/2023 1520   CREATININE 2.44 (H) 04/02/2024 0315   CREATININE 1.21 09/29/2016 1134   CALCIUM 8.2 (L) 04/02/2024 0315   GFRNONAA 31 (L) 04/02/2024 0315   GFRAA 72 11/20/2020 1253   CBC    Component Value Date/Time   WBC 7.3 04/01/2024 0315   RBC 4.14 (L) 04/01/2024 0315   HGB 11.2 (L) 04/01/2024 0315   HGB 11.1 (L) 06/15/2022 1140   HCT 33.7 (L) 04/01/2024 0315   HCT 35.5 (L) 06/15/2022 1140   PLT 188 04/01/2024 0315   PLT 371 06/15/2022 1140   MCV 81.4 04/01/2024 0315   MCV 75 (L) 06/15/2022 1140   MCH 27.1 04/01/2024 0315   MCHC 33.2 04/01/2024 0315   RDW 14.7 04/01/2024 0315   RDW 19.0 (H) 06/15/2022 1140   LYMPHSABS 2.6 03/27/2024 2210   LYMPHSABS 3.1 05/25/2019 1613   MONOABS 0.7 03/27/2024 2210   EOSABS 0.3 03/27/2024 2210   EOSABS 0.4 05/25/2019 1613   BASOSABS 0.1 03/27/2024 2210   BASOSABS 0.1 05/25/2019 1613     Assessment/Plan: 53 year old WM with many medical issues including CKD and proteinuria-  now with A on CRF 1.Renal- baseline CKD with crt around 1.7 with nephrotic range proteinuria.  This could be due to DM or obesity related FSG.  This has been present long term.  He has been seen at [facility] mostly by our extenders-  he has been attempted to start on ARB and SGLT2 but pt for whatever reason has not been able to follow through.  Over the 48 hours prior to consult he suffered A on CRF.   his BP has been soft on flomax, coreg and high dose lyrica.   Most consistent with hemodynamic issue- likely ATN.    have dec lyrica-  placed foley  stopped flomax-  coreg is low dose so no change for now but he was started on midodrine to raise BP.  No more mobic/all NSAIDs and cox-ii inhibitors-  The other issue is that he has pyuria and LUTS.  Urine sent for culture but also treat with rocephin-  then place foley given his LUTS.  I think he was supposed to follow with urology as OP but never did.  Therefore there could be an element of BOO and UTI 2. Hypertension/volume  - volume overloaded and symptomatic-   resume lasix-  but change meds as above to see if we can get him to respond better to the diuretic-  so far OK-  16109 of urine-  keep dose the same-  have a ways to go  3. LUTS-  place foley 4. Anemia  - had not been an issue 5. Hyperkalemia-  mild-  resolved   Benjamin Brands, MD [facility]

## 2024-04-02 NOTE — Progress Notes (Signed)
 PROGRESS NOTE    Joseph [name]  WUJ:[id number] DOB: 1971-07-12 DOA: 03/27/2024 PCP: Cain Saupe, MD   Brief Narrative: Joseph [name] is a 53 y.o. male with a history of morbid obesity, bilateral BKA's, diabetes mellitus type 2, hypertension, chronic heart failure, COPD, CKD 3B.  Patient presented secondary to progressively worsening dyspnea with concern for acute heart failure. Lasix IV started without significant improvement in symptoms. AKI developed. Nephrology consulted.   Assessment/Plan:  Possible acute on chronic HFpEF Patient started on Lasix IV diuresis. No significant improvement in symptoms since admission. Transthoracic Echocardiogram ordered and is significant for normal LVEF with no RV dysfunction. Diuresis held secondary to worsening creatinine. Lasix restarted by Nephrology.  Dyspnea Unclear if this is related to heart failure; no improvement with diuresis so far. D-dimer elevated; V/Q scan negative and CT chest unremarkable for etiology.  Unstable angina Somewhat atypical but patient has a history of unstable angina. Current episodes are consistent with unstable angina. Troponin mildly elevated at 37 with negative delta of 33. Significant cardiac history. Patient with improvement on nitroglycerin. Patient is on Ranexa as an outpatient. Transthoracic Echocardiogram without new pathology. -Continue Ranexa  CAD History of CABG x4 in 2021. Patient has a history of unstable angina. -Continue Plavix  Diabetes mellitus type 2 Uncontrolled with hyperglycemia. Hemoglobin A1C of 8.1%. Currently uncontrolled. -Increase to Semglee 40 units BID and continue SSI  Primary hypertension Antihypertensives held on admission. -Continue Coreg  Morbid obesity, class III Estimated body mass index is 75.27 kg/m as calculated from the following:   Height as of this encounter: 5\' 1"  (1.549 m).   Weight as of this encounter: 180.7 kg.  Anxiety -Continue Lexapro and  Wellbutrin  Hyperlipidemia -Continue Lipitor  Hyperkalemia In setting of AKI. Mild. Resolved.  AKI on CKD stage IIIb Baseline creatinine of 1.6-1.8. Creatinine continues to trend up. Urine output remains up. Creatinine worsened with peak of 2.49. Creatinine worsened with Lasix, however patient does have dependent edema. Nephrology consulted; concern for ATN. Lasix restarted. Foley catheter placed per nephrology recommendations on 4/12. -Nephrology recommendations: Lasix IV, foley  Proteus mirabilis UTI Ceftriaxone started empirically -Continue Ceftriaxone  Abnormal aortic valve Patient noted to have a functional bicuspid valve with two fused valves noted. Patient will need cardiology follow-up.  BPH -Hold Flomax per nephrology recommendations.   DVT prophylaxis: Lovenox Code Status:   Code Status: Full Code Family Communication: None at bedside Disposition Plan: Discharge home pending improvement of kidney function   Consultants:  Nephrology  Procedures:  Transthoracic Echocardiogram  Antimicrobials: Ceftriaxone    Subjective: Some chest and shoulder pain which is chronically intermittent for him.  Objective: BP 99/86 (BP Location: Right Arm)   Pulse 71   Temp 98.1 F (36.7 C) (Oral)   Resp 15   Ht 5\' 1"  (1.549 m) Comment: BKA Bi-lateral  Wt (!) 180.7 kg   SpO2 94%   BMI 75.27 kg/m   Examination:  General exam: Appears calm and comfortable Respiratory system: Clear to auscultation. Respiratory effort normal. Cardiovascular system: S1 & S2 heard, RRR. Gastrointestinal system: Abdomen is nondistended, soft and nontender. Normal bowel sounds heard. Central nervous system: Alert and oriented. No focal neurological deficits. Musculoskeletal: BLE edema at thighs Psychiatry: Judgement and insight appear normal. Mood & affect appropriate.    Data Reviewed: I have personally reviewed following labs and imaging studies   Last CBC Lab Results  Component  Value Date   WBC 7.3 04/01/2024   HGB 11.2 (L) 04/01/2024  HCT 33.7 (L) 04/01/2024   MCV 81.4 04/01/2024   MCH 27.1 04/01/2024   RDW 14.7 04/01/2024   PLT 188 04/01/2024     Last metabolic panel Lab Results  Component Value Date   GLUCOSE 309 (H) 04/02/2024   NA 134 (L) 04/02/2024   K 4.2 04/02/2024   CL 100 04/02/2024   CO2 24 04/02/2024   BUN 61 (H) 04/02/2024   CREATININE 2.44 (H) 04/02/2024   GFRNONAA 31 (L) 04/02/2024   CALCIUM 8.2 (L) 04/02/2024   PHOS 3.8 04/02/2024   PROT 5.8 (L) 01/09/2024   ALBUMIN 1.9 (L) 04/02/2024   LABGLOB 2.7 04/27/2023   AGRATIO 0.9 04/27/2023   BILITOT 0.5 01/09/2024   ALKPHOS 153 (H) 01/09/2024   AST 17 01/09/2024   ALT 21 01/09/2024   ANIONGAP 10 04/02/2024     Creatinine Clearance: Estimated Creatinine Clearance: 52.5 mL/min (A) (by C-G formula based on SCr of 2.44 mg/dL (H)).  Recent Results (from the past 240 hours)  Urine Culture (for pregnant, neutropenic or urologic patients or patients with an indwelling urinary catheter)     Status: Abnormal   Collection Time: 03/31/24  5:21 PM   Specimen: Urine, Clean Catch  Result Value Ref Range Status   Specimen Description URINE, CLEAN CATCH  Final   Special Requests   Final    NONE Performed at [facility], [address] [place], [name] [address]    Culture >=100,000 COLONIES/mL PROTEUS MIRABILIS (A)  Final   Report Status 04/02/2024 FINAL  Final   Organism ID, Bacteria PROTEUS MIRABILIS (A)  Final      Susceptibility   Proteus mirabilis - MIC*    AMPICILLIN <=2 SENSITIVE Sensitive     CEFAZOLIN <=4 SENSITIVE Sensitive     CEFEPIME <=0.12 SENSITIVE Sensitive     CEFTRIAXONE <=0.25 SENSITIVE Sensitive     CIPROFLOXACIN <=0.25 SENSITIVE Sensitive     GENTAMICIN <=1 SENSITIVE Sensitive     IMIPENEM 8 INTERMEDIATE Intermediate     NITROFURANTOIN 128 RESISTANT Resistant     TRIMETH/SULFA <=20 SENSITIVE Sensitive     AMPICILLIN/SULBACTAM <=2 SENSITIVE Sensitive      PIP/TAZO <=4 SENSITIVE Sensitive ug/mL    * >=100,000 COLONIES/mL PROTEUS MIRABILIS      Radiology Studies: No results found.     LOS: 5 days    Aneita Keens, MD [facility] 04/02/2024, 1:33 PM   If 7PM-7AM, please contact night-coverage [email or web address]

## 2024-04-03 ENCOUNTER — Ambulatory Visit: Admitting: Orthopedic Surgery

## 2024-04-03 DIAGNOSIS — N179 Acute kidney failure, unspecified: Secondary | ICD-10-CM | POA: Diagnosis not present

## 2024-04-03 DIAGNOSIS — I200 Unstable angina: Secondary | ICD-10-CM | POA: Diagnosis not present

## 2024-04-03 DIAGNOSIS — R0600 Dyspnea, unspecified: Secondary | ICD-10-CM | POA: Diagnosis not present

## 2024-04-03 LAB — RENAL FUNCTION PANEL
Albumin: 1.9 g/dL — ABNORMAL LOW (ref 3.5–5.0)
Anion gap: 7 (ref 5–15)
BUN: 60 mg/dL — ABNORMAL HIGH (ref 6–20)
CO2: 29 mmol/L (ref 22–32)
Calcium: 8.4 mg/dL — ABNORMAL LOW (ref 8.9–10.3)
Chloride: 103 mmol/L (ref 98–111)
Creatinine, Ser: 2.32 mg/dL — ABNORMAL HIGH (ref 0.61–1.24)
GFR, Estimated: 33 mL/min — ABNORMAL LOW (ref >60)
Glucose, Bld: 257 mg/dL — ABNORMAL HIGH (ref 70–99)
Phosphorus: 4.3 mg/dL (ref 2.5–4.6)
Potassium: 4.4 mmol/L (ref 3.5–5.1)
Sodium: 139 mmol/L (ref 135–145)

## 2024-04-03 LAB — PROTEIN ELECTROPHORESIS, SERUM
A/G Ratio: 0.8 (ref 0.7–1.7)
Albumin ELP: 2.2 g/dL — ABNORMAL LOW (ref 2.9–4.4)
Alpha-1-Globulin: 0.3 g/dL (ref 0.0–0.4)
Alpha-2-Globulin: 0.9 g/dL (ref 0.4–1.0)
Beta Globulin: 0.9 g/dL (ref 0.7–1.3)
Gamma Globulin: 0.7 g/dL (ref 0.4–1.8)
Globulin, Total: 2.9 g/dL (ref 2.2–3.9)
Total Protein ELP: 5.1 g/dL — ABNORMAL LOW (ref 6.0–8.5)

## 2024-04-03 LAB — GLUCOSE, CAPILLARY
Glucose-Capillary: 218 mg/dL — ABNORMAL HIGH (ref 70–99)
Glucose-Capillary: 219 mg/dL — ABNORMAL HIGH (ref 70–99)
Glucose-Capillary: 292 mg/dL — ABNORMAL HIGH (ref 70–99)
Glucose-Capillary: 284 mg/dL — ABNORMAL HIGH (ref 70–99)

## 2024-04-03 MED ORDER — INSULIN GLARGINE-YFGN 100 UNIT/ML ~~LOC~~ SOLN
45.0000 [IU] | Freq: Two times a day (BID) | SUBCUTANEOUS | Status: DC
  Administered 2024-04-03 – 2024-04-06 (×6): 45 [IU] via SUBCUTANEOUS
  Filled 2024-04-03 (×7): qty 0.45

## 2024-04-03 MED ORDER — INSULIN ASPART 100 UNIT/ML IJ SOLN
15.0000 [IU] | Freq: Three times a day (TID) | INTRAMUSCULAR | Status: DC
  Administered 2024-04-03 – 2024-04-06 (×10): 15 [IU] via SUBCUTANEOUS

## 2024-04-03 MED ORDER — ALPRAZOLAM 0.5 MG PO TABS
0.5000 mg | ORAL_TABLET | Freq: Two times a day (BID) | ORAL | Status: DC | PRN
  Administered 2024-04-03 – 2024-04-06 (×3): 0.5 mg via ORAL
  Filled 2024-04-03 (×3): qty 1

## 2024-04-03 MED ORDER — OXYCODONE HCL 5 MG PO TABS: 15.0000 mg | ORAL_TABLET | ORAL | Status: DC | PRN

## 2024-04-03 MED ORDER — OXYCODONE HCL 5 MG PO TABS
15.0000 mg | ORAL_TABLET | ORAL | Status: DC | PRN
  Administered 2024-04-03 – 2024-04-06 (×15): 15 mg via ORAL
  Filled 2024-04-03 (×15): qty 3

## 2024-04-03 NOTE — Inpatient Diabetes Management (Signed)
 Inpatient Diabetes Program Recommendations  AACE/ADA: New Consensus Statement on Inpatient Glycemic Control (2015)  Target Ranges:  Prepandial:   less than 140 mg/dL      Peak postprandial:   less than 180 mg/dL (1-2 hours)      Critically ill patients:  140 - 180 mg/dL   Lab Results  Component Value Date   GLUCAP 218 (H) 04/03/2024   HGBA1C 8.1 (H) 01/07/2024    Review of Glycemic Control  Latest Reference Range & Units 04/02/24 11:56 04/02/24 15:30 04/02/24 17:32 04/02/24 21:53 04/03/24 06:04  Glucose-Capillary 70 - 99 mg/dL 119 (H) 147 (H) 829 (H) 296 (H) 218 (H)  (H): Data is abnormally high Diabetes history: DM2 Outpatient Diabetes medications: Lantus 45 units BID, Novolog 0-20 units TID Current orders for Inpatient glycemic control: Semglee 40 units BID, Novolog 0-20 units TID and 0-5 units at bedtime, Novolog 10 units TID   Inpatient Diabetes Program Recommendations:     Consider further increasing Semglee 45 units BID (home dose).  Thanks, Marjo Sievert, MSN, RNC-OB Diabetes Coordinator [phone] (8a-5p)

## 2024-04-03 NOTE — Progress Notes (Signed)
 PROGRESS NOTE    Joseph [name]  BMW:[id number] DOB: [birth year] DOA: 03/27/2024 PCP: Cain Saupe, MD   Brief Narrative: Joseph [name] is a 53 y.o. male with a history of morbid obesity, bilateral BKA's, diabetes mellitus type 2, hypertension, chronic heart failure, COPD, CKD 3B.  Patient presented secondary to progressively worsening dyspnea with concern for acute heart failure. Lasix IV started without significant improvement in symptoms. AKI developed. Nephrology consulted.   Assessment/Plan:  Possible acute on chronic HFpEF Patient started on Lasix IV diuresis. No significant improvement in symptoms since admission. Transthoracic Echocardiogram ordered and is significant for normal LVEF with no RV dysfunction. Diuresis held secondary to worsening creatinine. Lasix restarted by Nephrology.  Dyspnea Unclear if this is related to heart failure; no improvement with diuresis so far. D-dimer elevated; V/Q scan negative and CT chest unremarkable for etiology.  Unstable angina Somewhat atypical but patient has a history of unstable angina. Current episodes are consistent with unstable angina. Troponin mildly elevated at 37 with negative delta of 33. Significant cardiac history. Patient with improvement on nitroglycerin. Patient is on Ranexa as an outpatient. Transthoracic Echocardiogram without new pathology. -Continue Ranexa  CAD History of CABG x4 in 2021. Patient has a history of unstable angina. -Continue Plavix  Diabetes mellitus type 2 Uncontrolled with hyperglycemia. Hemoglobin A1C of 8.1%. Currently uncontrolled. -Increase to Semglee 45 units BID and continue SSI -Novolog 15 units TID with meals  Primary hypertension Antihypertensives held on admission. -Continue Coreg  Morbid obesity, class III Estimated body mass index is 75.27 kg/m as calculated from the following:   Height as of this encounter: 5\' 1"  (1.549 m).   Weight as of this encounter: 180.7  kg.  Anxiety -Continue Lexapro and Wellbutrin  Hyperlipidemia -Continue Lipitor  Hyperkalemia In setting of AKI. Mild. Resolved.  AKI on CKD stage IIIb Baseline creatinine of 1.6-1.8. Creatinine continues to trend up. Urine output remains up. Creatinine worsened with peak of 2.49. Creatinine worsened with Lasix, however patient does have dependent edema. Nephrology consulted; concern for ATN. Lasix restarted. Foley catheter placed per nephrology recommendations on 4/12. -Nephrology recommendations: Lasix IV, foley  Proteus mirabilis UTI Ceftriaxone started empirically -Continue Ceftriaxone  Abnormal aortic valve Patient noted to have a functional bicuspid valve with two fused valves noted. Patient will need cardiology follow-up.  BPH -Hold Flomax per nephrology recommendations.   DVT prophylaxis: Lovenox Code Status:   Code Status: Full Code Family Communication: None at bedside Disposition Plan: Discharge home pending improvement of kidney function   Consultants:  Nephrology  Procedures:  Transthoracic Echocardiogram  Antimicrobials: Ceftriaxone    Subjective: Continued chronic shoulder pain. No other concerns.  Objective: BP 125/74 (BP Location: Left Arm)   Pulse 75   Temp 98 F (36.7 C) (Oral)   Resp 18   Ht 5\' 1"  (1.549 m) Comment: BKA Bi-lateral  Wt (!) 180.7 kg   SpO2 (!) 88%   BMI 75.27 kg/m   Examination:  General exam: Appears calm and comfortable  Respiratory system: Clear to auscultation. Respiratory effort normal. Cardiovascular system: S1 & S2 heard, RRR. Gastrointestinal system: Abdomen is nondistended, soft and nontender. Normal bowel sounds heard. Central nervous system: Alert and oriented. No focal neurological deficits. Musculoskeletal: BLE pitting edema of bilateral thighs. Bilateral BKA. Psychiatry: Judgement and insight appear normal. Mood & affect appropriate.    Data Reviewed: I have personally reviewed following labs and  imaging studies   Last CBC Lab Results  Component Value Date   WBC 7.3  04/01/2024   HGB 11.2 (L) 04/01/2024   HCT 33.7 (L) 04/01/2024   MCV 81.4 04/01/2024   MCH 27.1 04/01/2024   RDW 14.7 04/01/2024   PLT 188 04/01/2024     Last metabolic panel Lab Results  Component Value Date   GLUCOSE 257 (H) 04/03/2024   NA 139 04/03/2024   K 4.4 04/03/2024   CL 103 04/03/2024   CO2 29 04/03/2024   BUN 60 (H) 04/03/2024   CREATININE 2.32 (H) 04/03/2024   GFRNONAA 33 (L) 04/03/2024   CALCIUM 8.4 (L) 04/03/2024   PHOS 4.3 04/03/2024   PROT 5.8 (L) 01/09/2024   ALBUMIN 1.9 (L) 04/03/2024   LABGLOB 2.7 04/27/2023   AGRATIO 0.9 04/27/2023   BILITOT 0.5 01/09/2024   ALKPHOS 153 (H) 01/09/2024   AST 17 01/09/2024   ALT 21 01/09/2024   ANIONGAP 7 04/03/2024     Creatinine Clearance: Estimated Creatinine Clearance: 55.3 mL/min (A) (by C-G formula based on SCr of 2.32 mg/dL (H)).  Recent Results (from the past 240 hours)  Urine Culture (for pregnant, neutropenic or urologic patients or patients with an indwelling urinary catheter)     Status: Abnormal   Collection Time: 03/31/24  5:21 PM   Specimen: Urine, Clean Catch  Result Value Ref Range Status   Specimen Description URINE, CLEAN CATCH  Final   Special Requests   Final    NONE Performed at [facility], [address], [place], [name] [address]    Culture >=100,000 COLONIES/mL PROTEUS MIRABILIS (A)  Final   Report Status 04/02/2024 FINAL  Final   Organism ID, Bacteria PROTEUS MIRABILIS (A)  Final      Susceptibility   Proteus mirabilis - MIC*    AMPICILLIN <=2 SENSITIVE Sensitive     CEFAZOLIN <=4 SENSITIVE Sensitive     CEFEPIME <=0.12 SENSITIVE Sensitive     CEFTRIAXONE <=0.25 SENSITIVE Sensitive     CIPROFLOXACIN <=0.25 SENSITIVE Sensitive     GENTAMICIN <=1 SENSITIVE Sensitive     IMIPENEM 8 INTERMEDIATE Intermediate     NITROFURANTOIN 128 RESISTANT Resistant     TRIMETH/SULFA <=20 SENSITIVE Sensitive      AMPICILLIN/SULBACTAM <=2 SENSITIVE Sensitive     PIP/TAZO <=4 SENSITIVE Sensitive ug/mL    * >=100,000 COLONIES/mL PROTEUS MIRABILIS      Radiology Studies: No results found.     LOS: 6 days    Aneita Keens, MD [facility] 04/03/2024, 2:23 PM   If 7PM-7AM, please contact night-coverage [email or web address]

## 2024-04-03 NOTE — Progress Notes (Signed)
   04/03/24 2020  BiPAP/CPAP/SIPAP  Reason BIPAP/CPAP not in use Non-compliant  Respiratory Rate 20 breaths/min  FiO2 (%) 21 %   Pt on Room air sats 96%. Says he does not usually wear CPAP as he cannot tolerate it. Will call RT if he feels like he wants it at any time during the night.

## 2024-04-03 NOTE — Progress Notes (Signed)
 Patient ID: Joseph [name], male   DOB: 13-Jun-1971, 53 y.o.   MRN: [id number] S: No new complaints O:BP 129/82 (BP Location: Right Arm)   Pulse 71   Temp 97.6 F (36.4 C) (Oral)   Resp 18   Ht 5\' 1"  (1.549 m) Comment: BKA Bi-lateral  Wt (!) 180.7 kg   SpO2 (!) 88%   BMI 75.27 kg/m   Intake/Output Summary (Last 24 hours) at 04/03/2024 1039 Last data filed at 04/03/2024 0820 Gross per 24 hour  Intake 600 ml  Output 8900 ml  Net -8300 ml   Intake/Output: I/O last 3 completed shifts: In: 1380 [P.O.:1380] Out: 40981 [Urine:11650]  Intake/Output this shift:  Total I/O In: -  Out: 700 [Urine:700] Weight change: 0 kg Gen: obese, sitting up in bed in NAD CVS: RRR Resp:CTA Abd: +BS, soft, NT/ND Ext: s/p bilateral BKA's, 2+ edema of sacrum and stumps  Recent Labs  Lab 03/28/24 0500 03/29/24 0559 03/30/24 0323 03/31/24 0324 03/31/24 1446 04/01/24 0315 04/02/24 0315 04/03/24 0340  NA 138 136 136 134*  --  133* 134* 139  K 4.2 4.2 4.5 5.2* 5.2* 4.6 4.2 4.4  CL 105 103 103 102  --  100 100 103  CO2 24 24 24  20*  --  24 24 29   GLUCOSE 191* 342* 356* 340*  --  298* 309* 257*  BUN 30* 32* 42* 53*  --  58* 61* 60*  CREATININE 1.71* 1.78* 2.11* 2.49*  --  2.35* 2.44* 2.32*  ALBUMIN  --   --   --   --   --  2.0* 1.9* 1.9*  CALCIUM 8.7* 8.7* 8.3* 8.2*  --  8.4* 8.2* 8.4*  PHOS 5.2*  --   --   --   --  3.5 3.8 4.3   Liver Function Tests: Recent Labs  Lab 04/01/24 0315 04/02/24 0315 04/03/24 0340  ALBUMIN 2.0* 1.9* 1.9*   No results for input(s): "LIPASE", "AMYLASE" in the last 168 hours. No results for input(s): "AMMONIA" in the last 168 hours. CBC: Recent Labs  Lab 03/27/24 2210 03/28/24 0500 04/01/24 0315  WBC 7.8 7.7 7.3  NEUTROABS 4.1  --   --   HGB 13.0 12.3* 11.2*  HCT 40.4 38.3* 33.7*  MCV 82.6 83.3 81.4  PLT 267 225 188   Cardiac Enzymes: No results for input(s): "CKTOTAL", "CKMB", "CKMBINDEX", "TROPONINI" in the last 168 hours. CBG: Recent Labs  Lab  04/02/24 1156 04/02/24 1530 04/02/24 1732 04/02/24 2153 04/03/24 0604  GLUCAP 274* 263* 244* 296* 218*    Iron Studies: No results for input(s): "IRON", "TIBC", "TRANSFERRIN", "FERRITIN" in the last 72 hours. Studies/Results: No results found.  aspirin EC  81 mg Oral Daily   atorvastatin  80 mg Oral Daily   clopidogrel  75 mg Oral Daily   enoxaparin (LOVENOX) injection  80 mg Subcutaneous Daily   escitalopram  10 mg Oral Daily   fluticasone  2 spray Each Nare Daily   furosemide  80 mg Intravenous Q12H   insulin aspart  0-20 Units Subcutaneous TID WC   insulin aspart  15 Units Subcutaneous TID WC   insulin glargine-yfgn  45 Units Subcutaneous BID   midodrine  10 mg Oral BID WC   pantoprazole  40 mg Oral Daily   prednisoLONE acetate  1 drop Left Eye QID   pregabalin  100 mg Oral BID   ranolazine  500 mg Oral BID    BMET    Component Value Date/Time  NA 139 04/03/2024 0340   NA 138 04/25/2023 1520   K 4.4 04/03/2024 0340   CL 103 04/03/2024 0340   CO2 29 04/03/2024 0340   GLUCOSE 257 (H) 04/03/2024 0340   BUN 60 (H) 04/03/2024 0340   BUN 40 (H) 04/25/2023 1520   CREATININE 2.32 (H) 04/03/2024 0340   CREATININE 1.21 09/29/2016 1134   CALCIUM 8.4 (L) 04/03/2024 0340   GFRNONAA 33 (L) 04/03/2024 0340   GFRAA 72 11/20/2020 1253   CBC    Component Value Date/Time   WBC 7.3 04/01/2024 0315   RBC 4.14 (L) 04/01/2024 0315   HGB 11.2 (L) 04/01/2024 0315   HGB 11.1 (L) 06/15/2022 1140   HCT 33.7 (L) 04/01/2024 0315   HCT 35.5 (L) 06/15/2022 1140   PLT 188 04/01/2024 0315   PLT 371 06/15/2022 1140   MCV 81.4 04/01/2024 0315   MCV 75 (L) 06/15/2022 1140   MCH 27.1 04/01/2024 0315   MCHC 33.2 04/01/2024 0315   RDW 14.7 04/01/2024 0315   RDW 19.0 (H) 06/15/2022 1140   LYMPHSABS 2.6 03/27/2024 2210   LYMPHSABS 3.1 05/25/2019 1613   MONOABS 0.7 03/27/2024 2210   EOSABS 0.3 03/27/2024 2210   EOSABS 0.4 05/25/2019 1613   BASOSABS 0.1 03/27/2024 2210   BASOSABS 0.1  05/25/2019 1613    Assessment/Plan: 53 year old WM with many medical issues including CKD and proteinuria-  now with A on CRF 1.Renal- baseline CKD with crt around 1.7 with nephrotic range proteinuria.  This could be due to DM or obesity related FSG.  This has been present long term.  He has been seen at [facility] mostly by our extenders-  he has been attempted to start on ARB and SGLT2 but pt for whatever reason has not been able to follow through.  Over the 48 hours prior to consult he suffered A on CRF.   his BP has been soft on flomax, coreg and high dose lyrica.   Most consistent with hemodynamic issue- likely ATN.    have dec lyrica-  placed foley  stopped flomax-  coreg is low dose so no change for now but he was started on midodrine to raise BP.  No more mobic/all NSAIDs and cox-ii inhibitors-  The other issue is that he has pyuria and LUTS.  Urine sent for culture but also treat with rocephin-  then place foley given his LUTS.  I think he was supposed to follow with urology as OP but never did.  Therefore there could be an element of BOO and UTI.  BUN/Cr stable with diuresis.  No indication for dialysis at this time.  Will continue to follow.  2. Hypertension/volume  - volume overloaded and symptomatic-   resumed lasix with good response.  3550 of urine on 4/13 and 10,750 overnight.  Unclear if this is accurate-  keep dose the same  and continue to follow.  3. LUTS-  place foley 4. Anemia  - had not been an issue 5. Hyperkalemia-  mild-  resolved   Benjamin Brands, MD [employer] [phone]

## 2024-04-03 NOTE — Care Management Important Message (Signed)
 Important Message  Patient Details  Name: Joseph [name] MRN: [id number] Date of Birth: September 25, 1971   Important Message Given:  Yes - Medicare IM     Janith Melnick 04/03/2024, 9:52 AM

## 2024-04-04 DIAGNOSIS — Z86711 Personal history of pulmonary embolism: Secondary | ICD-10-CM

## 2024-04-04 DIAGNOSIS — I25119 Atherosclerotic heart disease of native coronary artery with unspecified angina pectoris: Secondary | ICD-10-CM | POA: Diagnosis not present

## 2024-04-04 DIAGNOSIS — E119 Type 2 diabetes mellitus without complications: Secondary | ICD-10-CM

## 2024-04-04 DIAGNOSIS — I5033 Acute on chronic diastolic (congestive) heart failure: Secondary | ICD-10-CM | POA: Diagnosis not present

## 2024-04-04 DIAGNOSIS — F419 Anxiety disorder, unspecified: Secondary | ICD-10-CM

## 2024-04-04 DIAGNOSIS — I10 Essential (primary) hypertension: Secondary | ICD-10-CM | POA: Diagnosis not present

## 2024-04-04 DIAGNOSIS — N179 Acute kidney failure, unspecified: Secondary | ICD-10-CM | POA: Diagnosis not present

## 2024-04-04 DIAGNOSIS — N1832 Chronic kidney disease, stage 3b: Secondary | ICD-10-CM

## 2024-04-04 DIAGNOSIS — F32A Depression, unspecified: Secondary | ICD-10-CM

## 2024-04-04 DIAGNOSIS — N390 Urinary tract infection, site not specified: Secondary | ICD-10-CM

## 2024-04-04 LAB — RENAL FUNCTION PANEL
Albumin: 2.2 g/dL — ABNORMAL LOW (ref 3.5–5.0)
Anion gap: 8 (ref 5–15)
BUN: 57 mg/dL — ABNORMAL HIGH (ref 6–20)
CO2: 32 mmol/L (ref 22–32)
Calcium: 8.6 mg/dL — ABNORMAL LOW (ref 8.9–10.3)
Chloride: 99 mmol/L (ref 98–111)
Creatinine, Ser: 2.3 mg/dL — ABNORMAL HIGH (ref 0.61–1.24)
GFR, Estimated: 34 mL/min — ABNORMAL LOW (ref >60)
Glucose, Bld: 127 mg/dL — ABNORMAL HIGH (ref 70–99)
Phosphorus: 4.2 mg/dL (ref 2.5–4.6)
Potassium: 4.7 mmol/L (ref 3.5–5.1)
Sodium: 139 mmol/L (ref 135–145)

## 2024-04-04 LAB — GLUCOSE, CAPILLARY
Glucose-Capillary: 164 mg/dL — ABNORMAL HIGH (ref 70–99)
Glucose-Capillary: 172 mg/dL — ABNORMAL HIGH (ref 70–99)
Glucose-Capillary: 203 mg/dL — ABNORMAL HIGH (ref 70–99)
Glucose-Capillary: 167 mg/dL — ABNORMAL HIGH (ref 70–99)

## 2024-04-04 LAB — CBC
HCT: 38.8 % — ABNORMAL LOW (ref 39.0–52.0)
Hemoglobin: 12.8 g/dL — ABNORMAL LOW (ref 13.0–17.0)
MCH: 27.1 pg (ref 26.0–34.0)
MCHC: 33 g/dL (ref 30.0–36.0)
MCV: 82.2 fL (ref 80.0–100.0)
Platelets: 253 10*3/uL (ref 150–400)
RBC: 4.72 MIL/uL (ref 4.22–5.81)
RDW: 14.6 % (ref 11.5–15.5)
WBC: 7.5 10*3/uL (ref 4.0–10.5)
nRBC: 0 % (ref 0.0–0.2)

## 2024-04-04 NOTE — Assessment & Plan Note (Signed)
 Continue blood pressure support with midodrine.

## 2024-04-04 NOTE — Assessment & Plan Note (Addendum)
 Patient was placed on insulin  sliding scale for glucose cover and monitoring Required basal and pre meal insulin  coverage.  His hyperglycemia improved and at the time of his discharge his fasting glucose was 193 mg/dl.

## 2024-04-04 NOTE — Assessment & Plan Note (Signed)
 Urine analysis with SG 1,016, > 300 protein, negative nitrates, large leukocytes and negative hgb, > 50 wbc.  Completed therapy with ceftriaxone

## 2024-04-04 NOTE — Assessment & Plan Note (Addendum)
 Echocardiogram with preserved LV systolic function with EF 50 to 60%, mild LVH, RV systolic function preserved,   Patient was placed on IV furosemide  for diuresis, negative fluid balance was achieved, - 23,070 ml with significant improvement in his symptoms.   Plan to continue diuretic therapy with furosemide  80 mg bid.  Limited pharmacologic therapy due to hypotension Continue midodrine  for blood pressure support.

## 2024-04-04 NOTE — Assessment & Plan Note (Signed)
 Sb CABG in 2021 No chest pain or acute coronary syndrome Continue clopidogrel

## 2024-04-04 NOTE — Hospital Course (Addendum)
 Mr, Lichtenwalner was admitted to the hospital with the working diagnosis of heart failure exacerbation.   53 y.o. male with a history of obesity class 3, bilateral BKA's, diabetes mellitus type 2, hypertension, chronic heart failure, COPD, CKD 3B.  Patient presented with progressive dyspnea, peripheral edema and orthopnea. He was found with volume overload by his primary care provider who referred him to the ED.  On his initial physical examination his blood pressure was 136/78, HR 88, RR 13 and 02 saturation 95% on room air.  Lungs with no wheezing or rhonchi, heart with S1 and S2 present and regular, abdomen with no distention, positive peripheral edema ++ pitting bilaterally, thighs.   Na 139, K 4,3 Cl 104 bicarbonate 27, glucose 199, bun 27 cr 1,68  BNP 90.4  High sensitive troponin 37 and 33  Wbc 7,8 hgb 13.0 plt 267   Chest radiograph with cardiomegaly, mild hilar vascular congestion. Sternotomy wires in place.   EKG 106 bpm, left axis deviation, normal intervals, qtc 451, sinus rhythm with one PVC, poor R R wave progression, with no significant ST segment or T wave changes.   Patient was placed on IV furosemide  for diuresis.  Prolonged hospitalization due to development of acute tubular necrosis, cardio renal syndrome.   04/18 volume status has improved and renal function stable. Plan for discharge home and have close follow up as outpatient.

## 2024-04-04 NOTE — Assessment & Plan Note (Addendum)
 Chronic back pain/ bipolar.  Continue with pregabalin, and escitalopram.  As needed alprazolam.

## 2024-04-04 NOTE — Progress Notes (Signed)
 Patient ID: Joseph [name], male   DOB: [birth year], 53 y.o.   MRN: [id number] S: Doesn't feel well today, complaining of SOB O:BP 116/69 (BP Location: Right Arm)   Pulse 78   Temp 97.7 F (36.5 C) (Oral)   Resp 17   Ht 5\' 1"  (1.549 m) Comment: BKA Bi-lateral  Wt (!) 179.2 kg   SpO2 95%   BMI 74.65 kg/m   Intake/Output Summary (Last 24 hours) at 04/04/2024 1003 Last data filed at 04/04/2024 0500 Gross per 24 hour  Intake 600 ml  Output 2100 ml  Net -1500 ml   Intake/Output: I/O last 3 completed shifts: In: 1080 [P.O.:1080] Out: 78295 [Urine:11000]  Intake/Output this shift:  No intake/output data recorded. Weight change: -1.5 kg Gen: NAD CVS: RRR Resp:CTA  Abd:+BS, soft, NT/ND Ext: 1+ edema, s/p bilateral BKA's.  Recent Labs  Lab 03/29/24 0559 03/30/24 0323 03/31/24 0324 03/31/24 1446 04/01/24 0315 04/02/24 0315 04/03/24 0340 04/04/24 0815  NA 136 136 134*  --  133* 134* 139 139  K 4.2 4.5 5.2* 5.2* 4.6 4.2 4.4 4.7  CL 103 103 102  --  100 100 103 99  CO2 24 24 20*  --  24 24 29  32  GLUCOSE 342* 356* 340*  --  298* 309* 257* 127*  BUN 32* 42* 53*  --  58* 61* 60* 57*  CREATININE 1.78* 2.11* 2.49*  --  2.35* 2.44* 2.32* 2.30*  ALBUMIN  --   --   --   --  2.0* 1.9* 1.9* 2.2*  CALCIUM 8.7* 8.3* 8.2*  --  8.4* 8.2* 8.4* 8.6*  PHOS  --   --   --   --  3.5 3.8 4.3 4.2   Liver Function Tests: Recent Labs  Lab 04/02/24 0315 04/03/24 0340 04/04/24 0815  ALBUMIN 1.9* 1.9* 2.2*   No results for input(s): "LIPASE", "AMYLASE" in the last 168 hours. No results for input(s): "AMMONIA" in the last 168 hours. CBC: Recent Labs  Lab 04/01/24 0315 04/04/24 0815  WBC 7.3 7.5  HGB 11.2* 12.8*  HCT 33.7* 38.8*  MCV 81.4 82.2  PLT 188 253   Cardiac Enzymes: No results for input(s): "CKTOTAL", "CKMB", "CKMBINDEX", "TROPONINI" in the last 168 hours. CBG: Recent Labs  Lab 04/03/24 0604 04/03/24 1226 04/03/24 1646 04/03/24 2056 04/04/24 0635  GLUCAP 218* 219* 292*  284* 164*    Iron Studies: No results for input(s): "IRON", "TIBC", "TRANSFERRIN", "FERRITIN" in the last 72 hours. Studies/Results: No results found.  aspirin EC  81 mg Oral Daily   atorvastatin  80 mg Oral Daily   clopidogrel  75 mg Oral Daily   enoxaparin (LOVENOX) injection  80 mg Subcutaneous Daily   escitalopram  10 mg Oral Daily   fluticasone  2 spray Each Nare Daily   furosemide  80 mg Intravenous Q12H   insulin aspart  0-20 Units Subcutaneous TID WC   insulin aspart  15 Units Subcutaneous TID WC   insulin glargine-yfgn  45 Units Subcutaneous BID   midodrine  10 mg Oral BID WC   pantoprazole  40 mg Oral Daily   prednisoLONE acetate  1 drop Left Eye QID   pregabalin  100 mg Oral BID   ranolazine  500 mg Oral BID    BMET    Component Value Date/Time   NA 139 04/04/2024 0815   NA 138 04/25/2023 1520   K 4.7 04/04/2024 0815   CL 99 04/04/2024 0815   CO2 32 04/04/2024 0815  GLUCOSE 127 (H) 04/04/2024 0815   BUN 57 (H) 04/04/2024 0815   BUN 40 (H) 04/25/2023 1520   CREATININE 2.30 (H) 04/04/2024 0815   CREATININE 1.21 09/29/2016 1134   CALCIUM 8.6 (L) 04/04/2024 0815   GFRNONAA 34 (L) 04/04/2024 0815   GFRAA 72 11/20/2020 1253   CBC    Component Value Date/Time   WBC 7.5 04/04/2024 0815   RBC 4.72 04/04/2024 0815   HGB 12.8 (L) 04/04/2024 0815   HGB 11.1 (L) 06/15/2022 1140   HCT 38.8 (L) 04/04/2024 0815   HCT 35.5 (L) 06/15/2022 1140   PLT 253 04/04/2024 0815   PLT 371 06/15/2022 1140   MCV 82.2 04/04/2024 0815   MCV 75 (L) 06/15/2022 1140   MCH 27.1 04/04/2024 0815   MCHC 33.0 04/04/2024 0815   RDW 14.6 04/04/2024 0815   RDW 19.0 (H) 06/15/2022 1140   LYMPHSABS 2.6 03/27/2024 2210   LYMPHSABS 3.1 05/25/2019 1613   MONOABS 0.7 03/27/2024 2210   EOSABS 0.3 03/27/2024 2210   EOSABS 0.4 05/25/2019 1613   BASOSABS 0.1 03/27/2024 2210   BASOSABS 0.1 05/25/2019 1613    Assessment/Plan: 53 year old WM with many medical issues including CKD and  proteinuria-  now with A on CRF 1.Renal- baseline CKD with crt around 1.7 with nephrotic range proteinuria.  This could be due to DM or obesity related FSG.  This has been present long term.  He has been seen at [facility] mostly by our extenders-  he has been attempted to start on ARB and SGLT2 but pt for whatever reason has not been able to follow through.  Over the 48 hours prior to consult he suffered A on CRF.   his BP has been soft on flomax, coreg and high dose lyrica.   Most consistent with hemodynamic issue- likely ATN.    have dec lyrica-  placed foley  stopped flomax-  coreg is low dose so no change for now but he was started on midodrine to raise BP.  No more mobic/all NSAIDs and cox-ii inhibitors-  The other issue is that he has pyuria and LUTS.  Urine sent for culture but also treat with rocephin-  then place foley given his LUTS.  I think he was supposed to follow with urology as OP but never did.  Therefore there could be an element of BOO and UTI.  BUN/Cr stable with diuresis.  No indication for dialysis at this time.  Will continue to follow.  2. Hypertension/volume  - volume overloaded and symptomatic-   resumed lasix with good response.  His edema is improving and Scr stable.  May want to switch to po diuretics tomorrow and see how he responds as he is nearing stability for discharge.   keep dose the same  and continue to follow.  3. LUTS-  place foley 4. Anemia  - had not been an issue 5. Hyperkalemia-  mild-  resolved   Benjamin Brands, MD [facility]

## 2024-04-04 NOTE — Assessment & Plan Note (Signed)
 Patient not on anticoagulation ?

## 2024-04-04 NOTE — Assessment & Plan Note (Addendum)
 Hyponatremia and hyperkalemia, hyperphosphatemia  Recovering ATN.   Significant improvement in volume status, at the time of his discharge his serum cr is stable at 2.4 with K at 4,9 and serum bicarbonate at 32  Na 142 and P 2,2   Likely his baseline serum cr is 2.5   Plan to continue diuresis with furosemide  and blood pressure support with midodrine  Foley catheter has been removed with no complications.  Patient will follow up as outpatient, renal function and electrolytes within 7 days.

## 2024-04-04 NOTE — Progress Notes (Addendum)
  Progress Note   Patient: Joseph [name] NGE:[id number] DOB: [birth year] DOA: 03/27/2024     7 DOS: the patient was seen and examined on 04/04/2024   Brief hospital course: Joseph [name], Joseph [name] was admitted to the hospital with the working diagnosis of heart failure exacerbation.   53 y.o. male with a history of morbid obesity, bilateral BKA's, diabetes mellitus type 2, hypertension, chronic heart failure, COPD, CKD 3B.  Patient presented secondary to progressively worsening dyspnea with concern for acute heart failure. Lasix IV started without significant improvement in symptoms. AKI developed. Nephrology consulted   Assessment and Plan: * Acute on chronic diastolic CHF (congestive heart failure) (HCC) Echocardiogram with preserved LV systolic function with EF 50 to 60%, mild LVH, RV systolic function preserved,   Urine output 2,800 cc  Systolic blood pressure 120 mmHg range.   Plan to continue diuresis with furosemide IV 80 mg bid Limited pharmacologic therapy due to hypotension Continue midodrine for blood pressure support.   Acute kidney injury superimposed on stage 3b chronic kidney disease (HCC) Hyponatremia and hyperkalemia  Renal function with serum cr at 2,30 with K at 4,7 and serum bicarbonate at 32  Na 139 and P 4,2   Plan to continue diuresis with furosemide and blood pressure support with midodrine Follow up renal function and electrolytes.   CAD (coronary artery disease) Sb CABG in 2021 No chest pain or acute coronary syndrome Continue clopidogrel   Essential hypertension Continue blood pressure support with midodrine.   Insulin dependent type 2 diabetes mellitus (HCC) Continue insulin sliding scale for glucose cover and monitoring Continue basal insulin and pre meal    History of pulmonary embolus (PE) Patient not on anticoagulation   Anxiety and depression Chronic back pain/ bipolar.  Continue with pregabalin, and escitalopram.  As needed alprazolam.   UTI  (urinary tract infection) Urine analysis with SG 1,016, > 300 protein, negative nitrates, large leukocytes and negative hgb, > 50 wbc.  Completed therapy with ceftriaxone       Subjective: Patient continue to have dyspnea and edema, improved but not back to baseline, no chest pain   Physical Exam: Vitals:   04/04/24 0023 04/04/24 0639 04/04/24 0729 04/04/24 1154  BP: (!) 124/92 128/69 116/69 100/76  Pulse: 79 75 78 82  Resp: 18 11 17 15   Temp: 98.6 F (37 C) 98.4 F (36.9 C) 97.7 F (36.5 C) 98.2 F (36.8 C)  TempSrc: Oral Oral Oral Oral  SpO2: 98% 96% 95% 96%  Weight:  (!) 179.2 kg    Height:       Neurology awake and alert ENT with mild pallor Cardiovascular with S1 and S2 present and regular with positive systolic murmur at the apex, n gallops or rubs No JVD Positive lower extremities thighs edema ++ pitting,  No abdominal distention  Bilateral BKA  Data Reviewed:    Family Communication: no family at the bedside   Disposition: Status is: Inpatient Remains inpatient appropriate because: IV diuresis   Planned Discharge Destination: Home     Author: Albertus Alt, MD 04/04/2024 4:27 PM  For on call review [email or web address].

## 2024-04-05 DIAGNOSIS — L8990 Pressure ulcer of unspecified site, unspecified stage: Secondary | ICD-10-CM

## 2024-04-05 DIAGNOSIS — I10 Essential (primary) hypertension: Secondary | ICD-10-CM | POA: Diagnosis not present

## 2024-04-05 DIAGNOSIS — I5033 Acute on chronic diastolic (congestive) heart failure: Secondary | ICD-10-CM | POA: Diagnosis not present

## 2024-04-05 DIAGNOSIS — N179 Acute kidney failure, unspecified: Secondary | ICD-10-CM | POA: Diagnosis not present

## 2024-04-05 DIAGNOSIS — I25119 Atherosclerotic heart disease of native coronary artery with unspecified angina pectoris: Secondary | ICD-10-CM | POA: Diagnosis not present

## 2024-04-05 LAB — GLUCOSE, CAPILLARY
Glucose-Capillary: 166 mg/dL — ABNORMAL HIGH (ref 70–99)
Glucose-Capillary: 156 mg/dL — ABNORMAL HIGH (ref 70–99)
Glucose-Capillary: 102 mg/dL — ABNORMAL HIGH (ref 70–99)
Glucose-Capillary: 167 mg/dL — ABNORMAL HIGH (ref 70–99)

## 2024-04-05 LAB — RENAL FUNCTION PANEL
Albumin: 2.4 g/dL — ABNORMAL LOW (ref 3.5–5.0)
Anion gap: 14 (ref 5–15)
BUN: 58 mg/dL — ABNORMAL HIGH (ref 6–20)
CO2: 24 mmol/L (ref 22–32)
Calcium: 8.5 mg/dL — ABNORMAL LOW (ref 8.9–10.3)
Chloride: 100 mmol/L (ref 98–111)
Creatinine, Ser: 2.34 mg/dL — ABNORMAL HIGH (ref 0.61–1.24)
GFR, Estimated: 33 mL/min — ABNORMAL LOW (ref >60)
Glucose, Bld: 175 mg/dL — ABNORMAL HIGH (ref 70–99)
Phosphorus: 5 mg/dL — ABNORMAL HIGH (ref 2.5–4.6)
Potassium: 4.7 mmol/L (ref 3.5–5.1)
Sodium: 138 mmol/L (ref 135–145)

## 2024-04-05 MED ORDER — CALCIUM ACETATE (PHOS BINDER) 667 MG PO CAPS
667.0000 mg | ORAL_CAPSULE | Freq: Three times a day (TID) | ORAL | Status: AC
  Administered 2024-04-05 – 2024-04-06 (×3): 667 mg via ORAL
  Filled 2024-04-05 (×3): qty 1

## 2024-04-05 MED ORDER — FUROSEMIDE 40 MG PO TABS
80.0000 mg | ORAL_TABLET | Freq: Two times a day (BID) | ORAL | Status: DC
  Administered 2024-04-05 – 2024-04-06 (×2): 80 mg via ORAL
  Filled 2024-04-05 (×2): qty 2

## 2024-04-05 MED ORDER — CHLORHEXIDINE GLUCONATE CLOTH 2 % EX PADS
6.0000 | MEDICATED_PAD | Freq: Every day | CUTANEOUS | Status: DC
  Administered 2024-04-05 – 2024-04-06 (×2): 6 via TOPICAL

## 2024-04-05 NOTE — Progress Notes (Signed)
 Progress Note   Patient: Joseph [name] ZOX:[id number] DOB: 1971/02/27 DOA: 03/27/2024     8 DOS: the patient was seen and examined on 04/05/2024   Brief hospital course: Mr, Zingaro was admitted to the hospital with the working diagnosis of heart failure exacerbation.   53 y.o. male with a history of morbid obesity, bilateral BKA's, diabetes mellitus type 2, hypertension, chronic heart failure, COPD, CKD 3B.  Patient presented with progressive dyspnea, peripheral edema and orthopnea. He was found with volume overload by his primary care provider who referred him to the ED.  On his initial physical examination his blood pressure was 136/78, HR 88, RR 13 and 02 saturation 95% on room air.  Lungs with no wheezing or rhonchi, heart with S1 and S2 present and regular, abdomen with no distention, positive peripheral edema ++ pitting bilaterally, thighs.     Assessment and Plan: * Acute on chronic diastolic CHF (congestive heart failure) (HCC) Echocardiogram with preserved LV systolic function with EF 50 to 60%, mild LVH, RV systolic function preserved,   Urine output 2,900 cc  Systolic blood pressure 120 mmHg range.   Transition to oral loop diuretic with furosemide 80 mg bid.  Limited pharmacologic therapy due to hypotension Continue midodrine for blood pressure support.   Acute kidney injury superimposed on stage 3b chronic kidney disease (HCC) Hyponatremia and hyperkalemia, hyperphosphatemia  Recovering ATN.   Stable serum cr at 2,34 with K at 4,7 and serum bicarbonate at 24  Na 138 and P 5.0   Plan to continue diuresis with furosemide, transition to po and blood pressure support with midodrine Add phosphate binder x 3 doses  Follow up renal function and electrolytes.  Will remove foley cathter in preparation for possible discharge home tomorrow.   CAD (coronary artery disease) Sb CABG in 2021 No chest pain or acute coronary syndrome Continue clopidogrel   Essential  hypertension Continue blood pressure support with midodrine.   Insulin dependent type 2 diabetes mellitus (HCC) Continue insulin sliding scale for glucose cover and monitoring Continue basal insulin and pre meal    History of pulmonary embolus (PE) Patient not on anticoagulation   Anxiety and depression Chronic back pain/ bipolar.  Continue with pregabalin, and escitalopram.  As needed alprazolam.   UTI (urinary tract infection) Urine analysis with SG 1,016, > 300 protein, negative nitrates, large leukocytes and negative hgb, > 50 wbc.  Completed therapy with ceftriaxone   Pressure injury Stage 2 pressure ulcer coccyx, present on admission       Subjective: Patient is feeling better, dyspnea and edema continue to improve, he had transitory chest pressure this morning self resolved.   Physical Exam: Vitals:   04/05/24 0045 04/05/24 0621 04/05/24 0722 04/05/24 1129  BP: 120/86 118/79 132/69 111/76  Pulse:  71 70 72  Resp: 16 11 14 16   Temp: 98.1 F (36.7 C) 97.7 F (36.5 C) (!) 97.5 F (36.4 C) 98.1 F (36.7 C)  TempSrc: Oral Oral Oral Oral  SpO2:   96% 92%  Weight:  (!) 170.2 kg    Height:       Neurology awake and alert ENT with mild pallor with no icterus Cardiovascular with S1 and S2 present and regular, with no gallops, positive systolic murmur at the apex No JVD Lungs with no rales or wheezing, no rhonchi  Bilateral BKA with thighs with + pitting edema bilaterally  Data Reviewed:    Family Communication: no family at the bedside   Disposition: Status is:  Inpatient Remains inpatient appropriate because: Diuresis   Planned Discharge Destination: Home     Author: Albertus Alt, MD 04/05/2024 2:03 PM  For on call review [email or web address].

## 2024-04-05 NOTE — Progress Notes (Signed)
 Patient ID: Joseph [name], male   DOB: 23-Feb-1971, 53 y.o.   MRN: [id number] S: No new complaints.  Continues to diurese well. O:BP 132/69 (BP Location: Right Arm)   Pulse 70   Temp (!) 97.5 F (36.4 C) (Oral)   Resp 14   Ht 5\' 1"  (1.549 m) Comment: BKA Bi-lateral  Wt (!) 170.2 kg   SpO2 96%   BMI 70.90 kg/m   Intake/Output Summary (Last 24 hours) at 04/05/2024 1115 Last data filed at 04/05/2024 0905 Gross per 24 hour  Intake 1140 ml  Output 3850 ml  Net -2710 ml   Intake/Output: I/O last 3 completed shifts: In: 1620 [P.O.:1620] Out: 4000 [Urine:4000]  Intake/Output this shift:  Total I/O In: -  Out: 950 [Urine:950] Weight change: -9 kg Gen: NAD CVS: RRR Resp:CTA Abd: +BS, soft, NT/ND Ext: s/p bilateral BKA's, trace edema  Recent Labs  Lab 03/30/24 0323 03/31/24 0324 03/31/24 1446 04/01/24 0315 04/02/24 0315 04/03/24 0340 04/04/24 0815 04/05/24 0306  NA 136 134*  --  133* 134* 139 139 138  K 4.5 5.2* 5.2* 4.6 4.2 4.4 4.7 4.7  CL 103 102  --  100 100 103 99 100  CO2 24 20*  --  24 24 29  32 24  GLUCOSE 356* 340*  --  298* 309* 257* 127* 175*  BUN 42* 53*  --  58* 61* 60* 57* 58*  CREATININE 2.11* 2.49*  --  2.35* 2.44* 2.32* 2.30* 2.34*  ALBUMIN  --   --   --  2.0* 1.9* 1.9* 2.2* 2.4*  CALCIUM 8.3* 8.2*  --  8.4* 8.2* 8.4* 8.6* 8.5*  PHOS  --   --   --  3.5 3.8 4.3 4.2 5.0*   Liver Function Tests: Recent Labs  Lab 04/03/24 0340 04/04/24 0815 04/05/24 0306  ALBUMIN 1.9* 2.2* 2.4*   No results for input(s): "LIPASE", "AMYLASE" in the last 168 hours. No results for input(s): "AMMONIA" in the last 168 hours. CBC: Recent Labs  Lab 04/01/24 0315 04/04/24 0815  WBC 7.3 7.5  HGB 11.2* 12.8*  HCT 33.7* 38.8*  MCV 81.4 82.2  PLT 188 253   Cardiac Enzymes: No results for input(s): "CKTOTAL", "CKMB", "CKMBINDEX", "TROPONINI" in the last 168 hours. CBG: Recent Labs  Lab 04/04/24 0635 04/04/24 1152 04/04/24 1659 04/04/24 2111 04/05/24 0618  GLUCAP  164* 172* 203* 167* 166*    Iron Studies: No results for input(s): "IRON", "TIBC", "TRANSFERRIN", "FERRITIN" in the last 72 hours. Studies/Results: No results found.  aspirin EC  81 mg Oral Daily   atorvastatin  80 mg Oral Daily   clopidogrel  75 mg Oral Daily   enoxaparin (LOVENOX) injection  80 mg Subcutaneous Daily   escitalopram  10 mg Oral Daily   fluticasone  2 spray Each Nare Daily   furosemide  80 mg Intravenous Q12H   insulin aspart  0-20 Units Subcutaneous TID WC   insulin aspart  15 Units Subcutaneous TID WC   insulin glargine-yfgn  45 Units Subcutaneous BID   midodrine  10 mg Oral BID WC   pantoprazole  40 mg Oral Daily   prednisoLONE acetate  1 drop Left Eye QID   pregabalin  100 mg Oral BID   ranolazine  500 mg Oral BID    BMET    Component Value Date/Time   NA 138 04/05/2024 0306   NA 138 04/25/2023 1520   K 4.7 04/05/2024 0306   CL 100 04/05/2024 0306   CO2 24  04/05/2024 0306   GLUCOSE 175 (H) 04/05/2024 0306   BUN 58 (H) 04/05/2024 0306   BUN 40 (H) 04/25/2023 1520   CREATININE 2.34 (H) 04/05/2024 0306   CREATININE 1.21 09/29/2016 1134   CALCIUM 8.5 (L) 04/05/2024 0306   GFRNONAA 33 (L) 04/05/2024 0306   GFRAA 72 11/20/2020 1253   CBC    Component Value Date/Time   WBC 7.5 04/04/2024 0815   RBC 4.72 04/04/2024 0815   HGB 12.8 (L) 04/04/2024 0815   HGB 11.1 (L) 06/15/2022 1140   HCT 38.8 (L) 04/04/2024 0815   HCT 35.5 (L) 06/15/2022 1140   PLT 253 04/04/2024 0815   PLT 371 06/15/2022 1140   MCV 82.2 04/04/2024 0815   MCV 75 (L) 06/15/2022 1140   MCH 27.1 04/04/2024 0815   MCHC 33.0 04/04/2024 0815   RDW 14.6 04/04/2024 0815   RDW 19.0 (H) 06/15/2022 1140   LYMPHSABS 2.6 03/27/2024 2210   LYMPHSABS 3.1 05/25/2019 1613   MONOABS 0.7 03/27/2024 2210   EOSABS 0.3 03/27/2024 2210   EOSABS 0.4 05/25/2019 1613   BASOSABS 0.1 03/27/2024 2210   BASOSABS 0.1 05/25/2019 1613    Assessment/Plan: 53 year old WM with many medical issues including  CKD and proteinuria-  now with A on CRF 1.Renal- baseline CKD with crt around 1.7 with nephrotic range proteinuria.  This could be due to DM or obesity related FSG.  This has been present long term.  He has been seen at [facility] mostly by our extenders-  he has been attempted to start on ARB and SGLT2 but pt for whatever reason has not been able to follow through.  Over the 48 hours prior to consult he suffered A on CRF.   his BP has been soft on flomax, coreg and high dose lyrica.   Most consistent with hemodynamic issue- likely ATN.    have dec lyrica-  placed foley  stopped flomax-  coreg is low dose so no change for now but he was started on midodrine to raise BP.  No more mobic/all NSAIDs and cox-ii inhibitors-  BUN/Cr stable with diuresis.  No indication for dialysis at this time.  Will continue to follow.   Will need to f/u in our office after discharge. 2. Hypertension/volume/acuet on chronic HFpEF  - volume overloaded and symptomatic on admission-   resumed lasix with good response.  His edema is improving and Scr stable.  Has diuresed well with marked improvement of volume status.  Will switch to po furosemide 80 mg bid and follow response.  Hopefully he is nearing stability for discharge.    3. LUTS-  place foley.  Will need Urology follow up after discharge. 4. Anemia  - had not been an issue 5. Hyperkalemia-  mild-  resolved  6. Proteus UTI - sensitive to rocephin. Benjamin Brands, MD [employer] [phone]

## 2024-04-05 NOTE — Plan of Care (Signed)
   Problem: Coping: Goal: Ability to adjust to condition or change in health will improve Outcome: Progressing

## 2024-04-05 NOTE — Assessment & Plan Note (Signed)
 Stage 2 pressure ulcer coccyx, present on admission

## 2024-04-05 NOTE — Plan of Care (Signed)
  Problem: Coping: Goal: Ability to adjust to condition or change in health will improve Outcome: Progressing   Problem: Nutrition: Goal: Adequate nutrition will be maintained Outcome: Progressing

## 2024-04-05 NOTE — Plan of Care (Signed)

## 2024-04-06 ENCOUNTER — Other Ambulatory Visit (HOSPITAL_COMMUNITY): Payer: Self-pay

## 2024-04-06 DIAGNOSIS — L8990 Pressure ulcer of unspecified site, unspecified stage: Secondary | ICD-10-CM

## 2024-04-06 DIAGNOSIS — I5033 Acute on chronic diastolic (congestive) heart failure: Secondary | ICD-10-CM | POA: Diagnosis not present

## 2024-04-06 DIAGNOSIS — N179 Acute kidney failure, unspecified: Secondary | ICD-10-CM | POA: Diagnosis not present

## 2024-04-06 DIAGNOSIS — I25119 Atherosclerotic heart disease of native coronary artery with unspecified angina pectoris: Secondary | ICD-10-CM | POA: Diagnosis not present

## 2024-04-06 DIAGNOSIS — N390 Urinary tract infection, site not specified: Secondary | ICD-10-CM

## 2024-04-06 DIAGNOSIS — T83511S Infection and inflammatory reaction due to indwelling urethral catheter, sequela: Secondary | ICD-10-CM

## 2024-04-06 DIAGNOSIS — I10 Essential (primary) hypertension: Secondary | ICD-10-CM | POA: Diagnosis not present

## 2024-04-06 LAB — RENAL FUNCTION PANEL
Albumin: 2.2 g/dL — ABNORMAL LOW (ref 3.5–5.0)
Anion gap: 11 (ref 5–15)
BUN: 59 mg/dL — ABNORMAL HIGH (ref 6–20)
CO2: 32 mmol/L (ref 22–32)
Calcium: 8.6 mg/dL — ABNORMAL LOW (ref 8.9–10.3)
Chloride: 99 mmol/L (ref 98–111)
Creatinine, Ser: 2.44 mg/dL — ABNORMAL HIGH (ref 0.61–1.24)
GFR, Estimated: 31 mL/min — ABNORMAL LOW (ref >60)
Glucose, Bld: 193 mg/dL — ABNORMAL HIGH (ref 70–99)
Phosphorus: 5 mg/dL — ABNORMAL HIGH (ref 2.5–4.6)
Potassium: 4.9 mmol/L (ref 3.5–5.1)
Sodium: 142 mmol/L (ref 135–145)

## 2024-04-06 LAB — GLUCOSE, CAPILLARY
Glucose-Capillary: 198 mg/dL — ABNORMAL HIGH (ref 70–99)
Glucose-Capillary: 141 mg/dL — ABNORMAL HIGH (ref 70–99)

## 2024-04-06 MED ORDER — ATORVASTATIN CALCIUM 80 MG PO TABS
80.0000 mg | ORAL_TABLET | Freq: Every day | ORAL | 0 refills | Status: DC
  Filled 2024-04-06 – 2024-04-16 (×2): qty 30, 30d supply, fill #0

## 2024-04-06 MED ORDER — PREGABALIN 100 MG PO CAPS: 100.0000 mg | ORAL_CAPSULE | Freq: Two times a day (BID) | ORAL | Status: AC

## 2024-04-06 MED ORDER — FUROSEMIDE 80 MG PO TABS
80.0000 mg | ORAL_TABLET | Freq: Two times a day (BID) | ORAL | 0 refills | Status: DC
  Filled 2024-04-06: qty 120, 60d supply, fill #0

## 2024-04-06 MED ORDER — MIDODRINE HCL 10 MG PO TABS
10.0000 mg | ORAL_TABLET | Freq: Two times a day (BID) | ORAL | 0 refills | Status: DC
  Filled 2024-04-06: qty 60, 30d supply, fill #0

## 2024-04-06 NOTE — Progress Notes (Addendum)
 DISCHARGE NOTE HOME Joseph [name] to be discharged Home per MD order. Discussed prescriptions and follow up appointments with the patient. Prescriptions given to patient; medication list explained in detail. Patient verbalized understanding.  Skin clean, dry and intact without evidence of skin break down, no evidence of skin tears noted. IV catheter discontinued intact. Site without signs and symptoms of complications. Dressing and pressure applied. Pt denies pain at the site currently. No complaints noted.  Patient discharging with pressure wounds.  See LDA  An After Visit Summary (AVS) was printed and given to the patient. Patient transported via [facility]   Tonda Francisco, RN

## 2024-04-06 NOTE — Discharge Summary (Addendum)
 Physician Discharge Summary   Patient: Joseph [name] MRN: [id number] DOB: [birth year]  Admit date:     03/27/2024  Discharge date: 04/06/24  Discharge Physician: Curlee Doss Ashleigh Arya   PCP: Berneda Bridges, MD   Recommendations at discharge:    Patient will continue diuresis with furosemide  80 mg po bid Added midodrine  for blood pressure support. Held Tamsulosin  due to risk of hypotension.  Follow up renal function and electrolytes in 7 days as outpatient Follow up with Dr Leann Prom in 7 to 10 days Follow up with Nephrology as scheduled.   Discharge Diagnoses: Principal Problem:   Acute on chronic diastolic CHF (congestive heart failure) (HCC) Active Problems:   Acute kidney injury superimposed on stage 3b chronic kidney disease (HCC)   CAD (coronary artery disease)   Essential hypertension   Insulin  dependent type 2 diabetes mellitus (HCC)   History of pulmonary embolus (PE)   Anxiety and depression   UTI (urinary tract infection)   Pressure injury  Resolved Problems:   * No resolved hospital problems. [facility] Course: Joseph [name] was admitted to the hospital with the working diagnosis of heart failure exacerbation.   53 y.o. male with a history of obesity class 3, bilateral BKA's, diabetes mellitus type 2, hypertension, chronic heart failure, COPD, CKD 3B.  Patient presented with progressive dyspnea, peripheral edema and orthopnea. He was found with volume overload by his primary care provider who referred him to the ED.  On his initial physical examination his blood pressure was 136/78, HR 88, RR 13 and 02 saturation 95% on room air.  Lungs with no wheezing or rhonchi, heart with S1 and S2 present and regular, abdomen with no distention, positive peripheral edema ++ pitting bilaterally, thighs.   Na 139, K 4,3 Cl 104 bicarbonate 27, glucose 199, bun 27 cr 1,68  BNP 90.4  High sensitive troponin 37 and 33  Wbc 7,8 hgb 13.0 plt 267   Chest radiograph with cardiomegaly, mild  hilar vascular congestion. Sternotomy wires in place.   EKG 106 bpm, left axis deviation, normal intervals, qtc 451, sinus rhythm with one PVC, poor R R wave progression, with no significant ST segment or T wave changes.   Patient was placed on IV furosemide  for diuresis.  Prolonged hospitalization due to development of acute tubular necrosis, cardio renal syndrome.   04/18 volume status has improved and renal function stable. Plan for discharge home and have close follow up as outpatient.     Assessment and Plan: * Acute on chronic diastolic CHF (congestive heart failure) (HCC) Echocardiogram with preserved LV systolic function with EF 50 to 60%, mild LVH, RV systolic function preserved,   Patient was placed on IV furosemide  for diuresis, negative fluid balance was achieved, - 23,070 ml with significant improvement in his symptoms.   Plan to continue diuretic therapy with furosemide  80 mg bid.  Limited pharmacologic therapy due to hypotension Continue midodrine  for blood pressure support.   Acute kidney injury superimposed on stage 3b chronic kidney disease (HCC) Hyponatremia and hyperkalemia, hyperphosphatemia  Recovering ATN.   Significant improvement in volume status, at the time of his discharge his serum cr is stable at 2.4 with K at 4,9 and serum bicarbonate at 32  Na 142 and P 2,2   Likely his baseline serum cr is 2.5   Plan to continue diuresis with furosemide  and blood pressure support with midodrine  Foley catheter has been removed with no complications.  Patient will follow up as outpatient, renal function  and electrolytes within 7 days.   CAD (coronary artery disease) Sb CABG in 2021 No chest pain or acute coronary syndrome Continue clopidogrel    Essential hypertension Continue blood pressure support with midodrine .   Insulin  dependent type 2 diabetes mellitus (HCC) Patient was placed on insulin  sliding scale for glucose cover and monitoring Required basal and  pre meal insulin  coverage.  His hyperglycemia improved and at the time of his discharge his fasting glucose was 193 mg/dl.  History of pulmonary embolus (PE) Patient not on anticoagulation   Anxiety and depression Chronic back pain/ bipolar.  Continue with pregabalin , and escitalopram .  As needed alprazolam .   UTI (urinary tract infection) Urine analysis with SG 1,016, > 300 protein, negative nitrates, large leukocytes and negative hgb, > 50 wbc.  Completed therapy with ceftriaxone    Pressure injury Stage 2 pressure ulcer coccyx, present on admission    Consultants: Nephrology  Procedures performed: none   Disposition: Home Diet recommendation:  Cardiac and Carb modified diet DISCHARGE MEDICATION: Allergies as of 04/06/2024       Reactions   Duloxetine Other (See Comments)   Headaches   Sulfa  Antibiotics Other (See Comments)   Headaches        Medication List     STOP taking these medications    carvedilol  6.25 MG tablet Commonly known as: COREG    doxycycline  100 MG tablet Commonly known as: VIBRA -TABS   fluconazole  100 MG tablet Commonly known as: DIFLUCAN    meloxicam 15 MG tablet Commonly known as: MOBIC   tamsulosin  0.4 MG Caps capsule Commonly known as: FLOMAX        TAKE these medications    acetaminophen  500 MG tablet Commonly known as: TYLENOL  Take 1,000 mg by mouth 3 (three) times daily as needed for headache.   albuterol  108 (90 Base) MCG/ACT inhaler Commonly known as: VENTOLIN  HFA Inhale 2 puffs into the lungs every 6 (six) hours as needed for shortness of breath.   ALPRAZolam  1 MG tablet Commonly known as: XANAX  Take 1 tablet (1 mg total) by mouth 2 (two) times daily as needed. What changed:  when to take this additional instructions   aspirin  EC 81 MG tablet Take 81 mg by mouth in the morning. Swallow whole.   atorvastatin  80 MG tablet Commonly known as: LIPITOR  Take 1 tablet (80 mg total) by mouth daily.   buPROPion  150 MG  24 hr tablet Commonly known as: WELLBUTRIN  XL Take 150 mg by mouth in the morning and at bedtime.   clopidogrel  75 MG tablet Commonly known as: PLAVIX  Take 1 tablet (75 mg total) by mouth daily.   escitalopram  10 MG tablet Commonly known as: LEXAPRO  Take 10 mg by mouth daily.   fluticasone  50 MCG/ACT nasal spray Commonly known as: FLONASE  Place 2 sprays into both nostrils daily.   furosemide  80 MG tablet Commonly known as: LASIX  Take 1 tablet (80 mg total) by mouth 2 (two) times daily. What changed:  medication strength how much to take when to take this   insulin  aspart 100 UNIT/ML injection Commonly known as: novoLOG  Inject 8 Units into the skin 3 (three) times daily with meals. Hold if eats less than 50% of meals What changed:  how much to take when to take this   ketotifen  0.035 % ophthalmic solution Commonly known as: ZADITOR  Place 1 drop into the left eye 2 (two) times daily.   Lantus  SoloStar 100 UNIT/ML Solostar Pen Generic drug: insulin  glargine Inject 38 Units into the skin 2 (two)  times daily. What changed: how much to take   lidocaine  5 % Commonly known as: LIDODERM  Place 1 patch onto the skin daily.   loratadine  10 MG tablet Commonly known as: CLARITIN  Take 10 mg by mouth daily.   midodrine  10 MG tablet Commonly known as: PROAMATINE  Take 1 tablet (10 mg total) by mouth 2 (two) times daily with a meal.   naloxone  4 MG/0.1ML Liqd nasal spray kit Commonly known as: NARCAN  Place 4 mg into the nose as needed (overdose).   nitroGLYCERIN  0.4 MG SL tablet Commonly known as: NITROSTAT  Place 1 tablet (0.4 mg total) under the tongue every 5 (five) minutes as needed.   oxyCODONE  15 MG immediate release tablet Commonly known as: ROXICODONE  Take 1 tablet (15 mg total) by mouth every 4 (four) - 6 (six) hours for 30 days.   pantoprazole  40 MG tablet Commonly known as: PROTONIX  Take 1 tablet (40 mg total) by mouth daily.   prednisoLONE  acetate 1 %  ophthalmic suspension Commonly known as: PRED FORTE  Place 1 drop into the left eye 4 (four) times daily.   pregabalin  100 MG capsule Commonly known as: LYRICA  Take 1 capsule (100 mg total) by mouth 2 (two) times daily. What changed: when to take this   promethazine  25 MG tablet Commonly known as: PHENERGAN  Take 25 mg by mouth every 4 (four) hours as needed for nausea or vomiting.   ranolazine  500 MG 12 hr tablet Commonly known as: RANEXA  Take 1 tablet (500 mg total) by mouth 2 (two) times daily.   tiZANidine  4 MG tablet Commonly known as: ZANAFLEX  Take 4 mg by mouth 3 (three) times daily.   Vascepa  1 g capsule Generic drug: icosapent  Ethyl TAKE TWO CAPSULES (2 G TOTAL) BY MOUTH TWICE DAILY APPOINTMENT OVERDUE: NEEDS APPOINTMENT FOR FURTHER REFILLS PLEASE CALL OFFICE        Discharge Exam: Filed Weights   04/04/24 0639 04/05/24 0621 04/06/24 0508  Weight: (!) 179.2 kg (!) 170.2 kg (!) 172.4 kg   BP 93/67 (BP Location: Right Arm)   Pulse 70   Temp 97.7 F (36.5 C) (Oral)   Resp 17   Ht 5\' 1"  (1.549 m) Comment: BKA Bi-lateral  Wt (!) 172.4 kg   SpO2 95%   BMI 71.81 kg/m   Patient is feeling better, no dyspnea or chest pain, no orthopnea or PND. Peripheral edema had significantly improved.   Neurology awake and alert ENT with mild pallor with no icterus Cardiovascular with S1 and S2 present and regular, positive systolic murmur at the apex. No rubs or gallops. Respiratory with no rales or wheezing, no rhonchi  Abdomen with no distention  Trace peripheral edema at the thighs Bilateral BKA   Condition at discharge: stable  The results of significant diagnostics from this hospitalization (including imaging, microbiology, ancillary and laboratory) are listed below for reference.   Imaging Studies: CT CHEST WO CONTRAST Result Date: 03/31/2024 CLINICAL DATA:  Leg swelling.  Respiratory illness EXAM: CT CHEST WITHOUT CONTRAST TECHNIQUE: Multidetector CT imaging of  the chest was performed following the standard protocol without IV contrast. RADIATION DOSE REDUCTION: This exam was performed according to the departmental dose-optimization program which includes automated exposure control, adjustment of the mA and/or kV according to patient size and/or use of iterative reconstruction technique. COMPARISON:  Chest radiograph 03/30/2024 FINDINGS: Cardiovascular: Post CABG.  No pericardial fluid. Mediastinum/Nodes: No axillary or supraclavicular adenopathy. No mediastinal or hilar adenopathy. No pericardial fluid. Esophagus normal. Lungs/Pleura: No pulmonary infarction. No pneumonia.  No pleural fluid. No pneumothorax Limited view of the liver, kidneys, pancreas are unremarkable. Upper Abdomen: Limited view of the liver, kidneys, pancreas are unremarkable. Normal adrenal glands. Musculoskeletal: No aggressive osseous lesion. Mild symmetric gynecomastia. IMPRESSION: 1. No acute findings in the chest. 2. Post CABG. Electronically Signed   By: Deboraha Fallow M.D.   On: 03/31/2024 11:35   NM Pulmonary Perfusion Result Date: 03/30/2024 CLINICAL DATA:  Chest pain.  Concern for pulmonary embolism. EXAM: NUCLEAR MEDICINE PERFUSION LUNG SCAN TECHNIQUE: Perfusion images were obtained in multiple projections after intravenous injection of radiopharmaceutical. RADIOPHARMACEUTICALS:  4.3 mCi Tc-51m MAA COMPARISON:  None Available. FINDINGS: No wedge-shaped peripheral perfusion defect within LEFT or RIGHT lung to suggest acute pulmonary embolism. Normal perfusion pattern. IMPRESSION: No evidence acute pulmonary embolism. Electronically Signed   By: Deboraha Fallow M.D.   On: 03/30/2024 17:57   DG CHEST PORT 1 VIEW Result Date: 03/30/2024 CLINICAL DATA:  [id number] Dyspnea [id number] EXAM: PORTABLE CHEST 1 VIEW COMPARISON:  March 27, 2024, January 07, 2024 FINDINGS: Elevation of the right hemidiaphragm. No focal airspace consolidation, pleural effusion, or pneumothorax. Sternotomy wires. Mild  cardiomegaly. No acute fracture or destructive lesion. IMPRESSION: Mild cardiomegaly.  No pneumonia or pulmonary edema. Electronically Signed   By: Rance Burrows M.D.   On: 03/30/2024 15:01   ECHOCARDIOGRAM COMPLETE Result Date: 03/28/2024    ECHOCARDIOGRAM REPORT   Patient Name:   VERNELL T[facility] Date of Exam: 03/28/2024 Medical Rec #:  [id number]       Height:       61.0 in Accession #:    [id number]      Weight:       380.0 lb Date of Birth:  04-19-1971       BSA:          2.481 m Patient Age:    52 years        BP:           133/72 mmHg Patient Gender: M               HR:           85 bpm. Exam Location:  Inpatient Procedure: 2D Echo, Cardiac Doppler, Color Doppler and Intracardiac            Opacification Agent (Both Spectral and Color Flow Doppler were            utilized during procedure). Indications:    Chest Pain R07.9  History:        Patient has prior history of Echocardiogram examinations, most                 recent 04/27/2023. CHF, CAD, COPD and CKD, stage 3,                 Arrythmias:Atrial Flutter, Signs/Symptoms:Hypotension and                 Dyspnea; Risk Factors:Hypertension, Diabetes, Sleep Apnea and                 Current Smoker.  Sonographer:    Terrilee Few RCS Referring Phys: [id number] CAROLE N HALL  Sonographer Comments: Technically difficult study due to poor echo windows, suboptimal parasternal window, suboptimal apical window, no subcostal window and patient is obese. IMPRESSIONS  1. Left ventricular ejection fraction, by estimation, is 55 to 60%. The left ventricle has normal function. The left ventricle has no regional wall motion abnormalities. There is mild concentric left ventricular hypertrophy. Left  ventricular diastolic parameters were normal.  2. Right ventricular systolic function is normal. The right ventricular size is not well visualized.  3. The mitral valve was not well visualized. Trivial mitral valve regurgitation. No evidence of mitral stenosis.  4. Valve appears  functionally bicuspid, with fused left and right coronary cusp. The aortic valve is abnormal. Aortic valve regurgitation is not visualized. Aortic valve sclerosis/calcification is present, without any evidence of aortic stenosis.  5. Aortic dilatation noted. There is mild dilatation of the ascending aorta, measuring 41 mm. Comparison(s): No significant change from prior study. Conclusion(s)/Recommendation(s): Very limited windows/technically challening images, even with use of echo contrast. Grossly preserved LV function, no severe wall motion abnormalities noted, but reduced sensitivity for detection of focal wall motion abnormalities based on images. FINDINGS  Left Ventricle: Left ventricular ejection fraction, by estimation, is 55 to 60%. The left ventricle has normal function. The left ventricle has no regional wall motion abnormalities. Definity  contrast agent was given IV to delineate the left ventricular  endocardial borders. The left ventricular internal cavity size was normal in size. There is mild concentric left ventricular hypertrophy. Left ventricular diastolic parameters were normal. Right Ventricle: The right ventricular size is not well visualized. Right vetricular wall thickness was not well visualized. Right ventricular systolic function is normal. Left Atrium: Left atrial size was normal in size. Right Atrium: Right atrial size was not well visualized. Pericardium: There is no evidence of pericardial effusion. Mitral Valve: The mitral valve was not well visualized. Trivial mitral valve regurgitation. No evidence of mitral valve stenosis. Tricuspid Valve: The tricuspid valve is not well visualized. Tricuspid valve regurgitation is trivial. No evidence of tricuspid stenosis. Aortic Valve: Valve appears functionally bicuspid, with fused left and right coronary cusp. The aortic valve is abnormal. Aortic valve regurgitation is not visualized. Aortic valve sclerosis/calcification is present, without any  evidence of aortic stenosis. Aortic valve peak gradient measures 4.3 mmHg. Pulmonic Valve: The pulmonic valve was not well visualized. Pulmonic valve regurgitation is not visualized. No evidence of pulmonic stenosis. Aorta: Aortic dilatation noted. There is mild dilatation of the ascending aorta, measuring 41 mm. Venous: The inferior vena cava was not well visualized. IAS/Shunts: The interatrial septum was not well visualized.  LEFT VENTRICLE PLAX 2D LVIDd:         4.10 cm   Diastology LVIDs:         2.70 cm   LV e' medial:    9.48 cm/s LV PW:         1.50 cm   LV E/e' medial:  7.2 LV IVS:        1.20 cm   LV e' lateral:   8.55 cm/s LVOT diam:     2.50 cm   LV E/e' lateral: 8.0 LV SV:         82 LV SV Index:   33 LVOT Area:     4.91 cm  RIGHT VENTRICLE RV S prime:     10.70 cm/s LEFT ATRIUM           Index LA diam:      3.60 cm 1.45 cm/m LA Vol (A2C): 43.3 ml 17.45 ml/m  AORTIC VALVE AV Area (Vmax): 4.39 cm AV Vmax:        104.00 cm/s AV Peak Grad:   4.3 mmHg LVOT Vmax:      92.93 cm/s LVOT Vmean:     60.167 cm/s LVOT VTI:       0.167 m  AORTA Ao Root  diam: 4.30 cm Ao Asc diam:  4.10 cm MITRAL VALVE MV Area (PHT): 2.99 cm     SHUNTS MV Decel Time: 254 msec     Systemic VTI:  0.17 m MV E velocity: 68.40 cm/s   Systemic Diam: 2.50 cm MV A velocity: 103.00 cm/s MV E/A ratio:  0.66 Sheryle Donning MD Electronically signed by Sheryle Donning MD Signature Date/Time: 03/28/2024/7:51:44 PM    Final    DG Chest Port 1 View Result Date: 03/28/2024 CLINICAL DATA:  SOB, CHF EXAM: PORTABLE CHEST 1 VIEW COMPARISON:  Chest x-ray 01/07/2024 FINDINGS: The heart and mediastinal contours are unchanged. Surgical changes overlie the mediastinum. No focal consolidation. No pulmonary edema. No pleural effusion. No pneumothorax. No acute osseous abnormality.  Severe anatomy wires are intact. IMPRESSION: No active disease. Electronically Signed   By: Morgane  Naveau M.D.   On: 03/28/2024 00:44    Microbiology: Results  for orders placed or performed during the hospital encounter of 03/27/24  Urine Culture (for pregnant, neutropenic or urologic patients or patients with an indwelling urinary catheter)     Status: Abnormal   Collection Time: 03/31/24  5:21 PM   Specimen: Urine, Clean Catch  Result Value Ref Range Status   Specimen Description URINE, CLEAN CATCH  Final   Special Requests   Final    NONE Performed at [facility], [address], [place], [name] [address]    Culture >=100,000 COLONIES/mL PROTEUS MIRABILIS (A)  Final   Report Status 04/02/2024 FINAL  Final   Organism ID, Bacteria PROTEUS MIRABILIS (A)  Final      Susceptibility   Proteus mirabilis - MIC*    AMPICILLIN <=2 SENSITIVE Sensitive     CEFAZOLIN  <=4 SENSITIVE Sensitive     CEFEPIME  <=0.12 SENSITIVE Sensitive     CEFTRIAXONE  <=0.25 SENSITIVE Sensitive     CIPROFLOXACIN  <=0.25 SENSITIVE Sensitive     GENTAMICIN <=1 SENSITIVE Sensitive     IMIPENEM 8 INTERMEDIATE Intermediate     NITROFURANTOIN 128 RESISTANT Resistant     TRIMETH /SULFA  <=20 SENSITIVE Sensitive     AMPICILLIN/SULBACTAM <=2 SENSITIVE Sensitive     PIP/TAZO <=4 SENSITIVE Sensitive ug/mL    * >=100,000 COLONIES/mL PROTEUS MIRABILIS    Labs: CBC: Recent Labs  Lab 04/01/24 0315 04/04/24 0815  WBC 7.3 7.5  HGB 11.2* 12.8*  HCT 33.7* 38.8*  MCV 81.4 82.2  PLT 188 253   Basic Metabolic Panel: Recent Labs  Lab 03/31/24 0324 03/31/24 1446 04/01/24 0315 04/02/24 0315 04/03/24 0340 04/04/24 0815 04/05/24 0306 04/06/24 0252  NA 134*  --  133* 134* 139 139 138 142  K 5.2*   < > 4.6 4.2 4.4 4.7 4.7 4.9  CL 102  --  100 100 103 99 100 99  CO2 20*  --  24 24 29  32 24 32  GLUCOSE 340*  --  298* 309* 257* 127* 175* 193*  BUN 53*  --  58* 61* 60* 57* 58* 59*  CREATININE 2.49*  --  2.35* 2.44* 2.32* 2.30* 2.34* 2.44*  CALCIUM  8.2*  --  8.4* 8.2* 8.4* 8.6* 8.5* 8.6*  MG 1.5*  --  2.0  --   --   --   --   --   PHOS  --    < > 3.5 3.8 4.3 4.2 5.0* 5.0*    < > = values in this interval not displayed.   Liver Function Tests: Recent Labs  Lab 04/02/24 0315 04/03/24 0340 04/04/24 0815 04/05/24  6045 04/06/24 0252  ALBUMIN  1.9* 1.9* 2.2* 2.4* 2.2*   CBG: Recent Labs  Lab 04/05/24 0618 04/05/24 1126 04/05/24 1600 04/05/24 2029 04/06/24 0638  GLUCAP 166* 156* 102* 167* 198*    Discharge time spent: greater than 30 minutes.  Signed: Albertus Alt, MD [facility] 04/06/2024

## 2024-04-06 NOTE — Plan of Care (Signed)
  Problem: Pain Managment: Goal: General experience of comfort will improve and/or be controlled Outcome: Progressing   Problem: Safety: Goal: Ability to remain free from injury will improve Outcome: Progressing

## 2024-04-06 NOTE — TOC Transition Note (Signed)
 Transition of Care [facility]) - Discharge Note   Patient Details  Name: Joseph [name] MRN: [id number] Date of Birth: 1971/01/04  Transition of [facility] CM/SW Contact:  Jennett Model, RN Phone Number: 04/06/2024, 11:15 AM   Clinical Narrative:    For dc today, he is a bil amputee, non ambulatory. Will need ambulance transport,  PTAR has been scheduled.           Patient Goals and CMS Choice            Discharge Placement                       Discharge Plan and Services Additional resources added to the After Visit Summary for                                       Social Drivers of Health (SDOH) Interventions SDOH Screenings   Food Insecurity: No Food Insecurity (03/28/2024)  Housing: Low Risk  (03/28/2024)  Transportation Needs: Unmet Transportation Needs (03/28/2024)  Utilities: Not At Risk (03/28/2024)  Depression (PHQ2-9): Low Risk  (05/06/2021)  Financial Resource Strain: Medium Risk (09/09/2022)  Social Connections: Moderately Isolated (03/28/2024)  Stress: No Stress Concern Present (10/10/2023)   Received from [facility]  Tobacco Use: Medium Risk (03/28/2024)     Readmission Risk Interventions    05/03/2023   11:58 AM 11/24/2021    4:05 PM 08/20/2021   12:26 PM  Readmission Risk Prevention Plan  Transportation Screening Complete Complete Complete  PCP or Specialist Appt within 3-5 Days Complete    HRI or Home Care Consult Complete    Social Work Consult for Recovery Care Planning/Counseling Complete    Palliative Care Screening Not Applicable    Medication Review Oceanographer) Referral to Pharmacy Complete Complete  PCP or Specialist appointment within 3-5 days of discharge  Complete Complete  HRI or Home Care Consult  Complete Complete  SW Recovery Care/Counseling Consult  Complete Complete  Palliative Care Screening  Not Applicable Not Applicable  Skilled Nursing Facility  Complete Not Applicable

## 2024-04-06 NOTE — Progress Notes (Signed)
 Patient ID: Joseph [name], male   DOB: October 18, 1971, 53 y.o.   MRN: [id number] S: No new complaints O:BP 93/67 (BP Location: Right Arm)   Pulse 70   Temp 97.7 F (36.5 C) (Oral)   Resp 17   Ht 5\' 1"  (1.549 m) Comment: BKA Bi-lateral  Wt (!) 172.4 kg   SpO2 95%   BMI 71.81 kg/m   Intake/Output Summary (Last 24 hours) at 04/06/2024 1101 Last data filed at 04/06/2024 0921 Gross per 24 hour  Intake 540 ml  Output 1875 ml  Net -1335 ml   Intake/Output: I/O last 3 completed shifts: In: 1080 [P.O.:1080] Out: 5150 [Urine:5150]  Intake/Output this shift:  Total I/O In: 360 [P.O.:360] Out: 575 [Urine:575] Weight change: 2.2 kg Gen: NAD CVS: RRR Resp:CTA Abd: +BS, soft, NT/ND Ext: s/p bilateral BKA's, trace presacral edema  Recent Labs  Lab 03/31/24 0324 03/31/24 1446 04/01/24 0315 04/02/24 0315 04/03/24 0340 04/04/24 0815 04/05/24 0306 04/06/24 0252  NA 134*  --  133* 134* 139 139 138 142  K 5.2* 5.2* 4.6 4.2 4.4 4.7 4.7 4.9  CL 102  --  100 100 103 99 100 99  CO2 20*  --  24 24 29  32 24 32  GLUCOSE 340*  --  298* 309* 257* 127* 175* 193*  BUN 53*  --  58* 61* 60* 57* 58* 59*  CREATININE 2.49*  --  2.35* 2.44* 2.32* 2.30* 2.34* 2.44*  ALBUMIN   --   --  2.0* 1.9* 1.9* 2.2* 2.4* 2.2*  CALCIUM  8.2*  --  8.4* 8.2* 8.4* 8.6* 8.5* 8.6*  PHOS  --   --  3.5 3.8 4.3 4.2 5.0* 5.0*   Liver Function Tests: Recent Labs  Lab 04/04/24 0815 04/05/24 0306 04/06/24 0252  ALBUMIN  2.2* 2.4* 2.2*   No results for input(s): "LIPASE", "AMYLASE" in the last 168 hours. No results for input(s): "AMMONIA" in the last 168 hours. CBC: Recent Labs  Lab 04/01/24 0315 04/04/24 0815  WBC 7.3 7.5  HGB 11.2* 12.8*  HCT 33.7* 38.8*  MCV 81.4 82.2  PLT 188 253   Cardiac Enzymes: No results for input(s): "CKTOTAL", "CKMB", "CKMBINDEX", "TROPONINI" in the last 168 hours. CBG: Recent Labs  Lab 04/05/24 0618 04/05/24 1126 04/05/24 1600 04/05/24 2029 04/06/24 0638  GLUCAP 166* 156*  102* 167* 198*    Iron Studies: No results for input(s): "IRON", "TIBC", "TRANSFERRIN", "FERRITIN" in the last 72 hours. Studies/Results: No results found.  aspirin  EC  81 mg Oral Daily   atorvastatin   80 mg Oral Daily   calcium  acetate  667 mg Oral TID WC   Chlorhexidine  Gluconate Cloth  6 each Topical Daily   clopidogrel   75 mg Oral Daily   enoxaparin  (LOVENOX ) injection  80 mg Subcutaneous Daily   escitalopram   10 mg Oral Daily   fluticasone   2 spray Each Nare Daily   furosemide   80 mg Oral BID   insulin  aspart  0-20 Units Subcutaneous TID WC   insulin  aspart  15 Units Subcutaneous TID WC   insulin  glargine-yfgn  45 Units Subcutaneous BID   midodrine   10 mg Oral BID WC   pantoprazole   40 mg Oral Daily   prednisoLONE  acetate  1 drop Left Eye QID   pregabalin   100 mg Oral BID   ranolazine   500 mg Oral BID    BMET    Component Value Date/Time   NA 142 04/06/2024 0252   NA 138 04/25/2023 1520   K 4.9 04/06/2024  0252   CL 99 04/06/2024 0252   CO2 32 04/06/2024 0252   GLUCOSE 193 (H) 04/06/2024 0252   BUN 59 (H) 04/06/2024 0252   BUN 40 (H) 04/25/2023 1520   CREATININE 2.44 (H) 04/06/2024 0252   CREATININE 1.21 09/29/2016 1134   CALCIUM  8.6 (L) 04/06/2024 0252   GFRNONAA 31 (L) 04/06/2024 0252   GFRAA 72 11/20/2020 1253   CBC    Component Value Date/Time   WBC 7.5 04/04/2024 0815   RBC 4.72 04/04/2024 0815   HGB 12.8 (L) 04/04/2024 0815   HGB 11.1 (L) 06/15/2022 1140   HCT 38.8 (L) 04/04/2024 0815   HCT 35.5 (L) 06/15/2022 1140   PLT 253 04/04/2024 0815   PLT 371 06/15/2022 1140   MCV 82.2 04/04/2024 0815   MCV 75 (L) 06/15/2022 1140   MCH 27.1 04/04/2024 0815   MCHC 33.0 04/04/2024 0815   RDW 14.6 04/04/2024 0815   RDW 19.0 (H) 06/15/2022 1140   LYMPHSABS 2.6 03/27/2024 2210   LYMPHSABS 3.1 05/25/2019 1613   MONOABS 0.7 03/27/2024 2210   EOSABS 0.3 03/27/2024 2210   EOSABS 0.4 05/25/2019 1613   BASOSABS 0.1 03/27/2024 2210   BASOSABS 0.1 05/25/2019 1613     Assessment/Plan: 53 year old WM with many medical issues including CKD and proteinuria-  now with A on CRF 1.Renal- baseline CKD with crt around 1.7 with nephrotic range proteinuria.  This could be due to DM or obesity related FSG.  This has been present long term.  He has been seen at [facility] mostly by our extenders-  he has been attempted to start on ARB and SGLT2 but pt for whatever reason has not been able to follow through.  Over the 48 hours prior to consult he suffered A on CRF.   his BP has been soft on flomax , coreg  and high dose lyrica .   Most consistent with hemodynamic issue- likely ATN.    have dec lyrica -  placed foley  stopped flomax -  coreg  is low dose so no change for now but he was started on midodrine  to raise BP.  No more mobic/all NSAIDs and cox-ii inhibitors-  BUN/Cr stable with diuresis.  No indication for dialysis at this time.  Stable for discharge from renal standpoint and higher dose of furosemide .  Will need to f/u in our office 2-3 weeks after discharge with Dr. Ansel Kingdom or extender clinic. 2. Hypertension/volume/acuet on chronic HFpEF  - volume overloaded and symptomatic on admission-   resumed lasix  with good response.  His edema is improving and Scr stable.  Has diuresed well with marked improvement of volume status.  Will switch to po furosemide  80 mg bid and follow response.  Hopefully he is nearing stability for discharge.    3. LUTS-  place foley.  Will need Urology follow up after discharge. 4. Anemia  - had not been an issue 5. Hyperkalemia-  mild-  resolved  6. Proteus UTI - sensitive to rocephin .  Benjamin Brands, MD [facility] [phone]

## 2024-04-06 NOTE — Progress Notes (Signed)
 Heart Failure Navigator Progress Note  Assessed for Heart & Vascular TOC clinic readiness.  Patient does not meet criteria due to EF 55-60%, [facility] appointment date and time pending their scheduler. .   Navigator will sign off at this time.   Randie Bustle, BSN, Scientist, clinical (histocompatibility and immunogenetics) Only

## 2024-04-16 ENCOUNTER — Other Ambulatory Visit (HOSPITAL_COMMUNITY): Payer: Self-pay

## 2024-04-16 MED ORDER — OXYCODONE HCL 15 MG PO TABS
15.0000 mg | ORAL_TABLET | ORAL | 0 refills | Status: DC
  Filled 2024-04-16 (×2): qty 150, 25d supply, fill #0

## 2024-04-16 MED ORDER — ALPRAZOLAM 1 MG PO TABS
1.0000 mg | ORAL_TABLET | Freq: Two times a day (BID) | ORAL | 0 refills | Status: DC
  Filled 2024-04-16 (×2): qty 60, 30d supply, fill #0

## 2024-04-17 ENCOUNTER — Other Ambulatory Visit (HOSPITAL_BASED_OUTPATIENT_CLINIC_OR_DEPARTMENT_OTHER): Payer: Self-pay

## 2024-04-17 ENCOUNTER — Other Ambulatory Visit: Payer: Self-pay

## 2024-04-17 ENCOUNTER — Other Ambulatory Visit (HOSPITAL_COMMUNITY): Payer: Self-pay

## 2024-04-19 ENCOUNTER — Other Ambulatory Visit (HOSPITAL_COMMUNITY): Payer: Self-pay

## 2024-04-19 MED ORDER — NALOXONE HCL 4 MG/0.1ML NA LIQD
1.0000 | NASAL | 11 refills | Status: DC
  Filled 2024-04-19: qty 1, 1d supply, fill #0

## 2024-04-19 MED ORDER — FREESTYLE LIBRE 3 PLUS SENSOR MISC
3 refills | Status: AC
  Filled 2024-04-19: qty 6, 90d supply, fill #0
  Filled 2024-07-03: qty 6, 90d supply, fill #1
  Filled 2024-09-14: qty 6, 90d supply, fill #2
  Filled 2024-12-30: qty 6, 90d supply, fill #3

## 2024-04-19 MED ORDER — ALPRAZOLAM 1 MG PO TABS
1.0000 mg | ORAL_TABLET | Freq: Two times a day (BID) | ORAL | 0 refills | Status: DC | PRN
  Filled 2024-04-19: qty 120, 30d supply, fill #0

## 2024-04-19 MED ORDER — LIDOCAINE 5 % EX PTCH
1.0000 | MEDICATED_PATCH | Freq: Every day | CUTANEOUS | 11 refills | Status: AC
  Filled 2024-04-19: qty 60, 30d supply, fill #0
  Filled 2024-05-08 – 2024-05-14 (×4): qty 60, 30d supply, fill #1
  Filled 2024-05-30 – 2024-06-06 (×3): qty 60, 30d supply, fill #2
  Filled 2024-07-19: qty 60, 30d supply, fill #3
  Filled 2024-08-12: qty 60, 30d supply, fill #4
  Filled 2024-09-24: qty 60, 30d supply, fill #5
  Filled 2024-10-27: qty 60, 30d supply, fill #6
  Filled 2024-11-28: qty 60, 30d supply, fill #7
  Filled 2024-12-27: qty 60, 30d supply, fill #8

## 2024-04-23 ENCOUNTER — Other Ambulatory Visit: Payer: Self-pay | Admitting: Physician Assistant

## 2024-04-25 ENCOUNTER — Other Ambulatory Visit (HOSPITAL_COMMUNITY): Payer: Self-pay

## 2024-04-25 ENCOUNTER — Other Ambulatory Visit: Payer: Self-pay | Admitting: Physician Assistant

## 2024-04-25 MED ORDER — ICOSAPENT ETHYL 1 G PO CAPS: 2.0000 g | ORAL_CAPSULE | Freq: Two times a day (BID) | ORAL | 0 refills | Status: DC

## 2024-04-25 NOTE — Addendum Note (Signed)
 Addended by: Gayleen Kawasaki D on: 04/25/2024 03:56 PM   Modules accepted: Orders

## 2024-04-26 NOTE — Progress Notes (Addendum)
 " Cardiology Office Note    Patient Name: Leif Loflin Clopper Date of Encounter: 04/26/2024  Primary Care Provider:  Alec House, MD Primary Cardiologist:  Oneil Parchment, MD Primary Electrophysiologist: None   Past Medical History    Past Medical History:  Diagnosis Date   Aneurysm of ascending aorta (HCC) 09/14/2021   Anxiety    Aortic atherosclerosis (HCC) 11/28/2023   Arthritis    knees, shoulders, hips, ankles (07/29/2016)   Asthma    Bicuspid aortic valve 09/14/2021   BMI 70 and over, adult (HCC) 02/16/2019   CAD (coronary artery disease)    a. 2017: s/p BMS to distal Cx; b. LHC 05/30/2019: 80% mid, distal RCA s/p DES, 30% narrowing of d LM, widely patent LAD w/ luminal irregularities, widely patent stent in dCX  w/ 90+% stenosis distal to stent beofre small trifurcating obtuse marginal (potentially area of restenosis)   Chronic bronchitis (HCC)    Chronic ulcer of great toe of right foot (HCC) 09/09/2017   Chronic ulcer of right great toe (HCC) 09/19/2017   COPD (chronic obstructive pulmonary disease) (HCC)    Coronary arteriosclerosis    Deep venous thrombosis (HCC) 02/16/2019   Depression    Diabetes mellitus (HCC)    DVT (deep venous thrombosis) (HCC)    GERD (gastroesophageal reflux disease)    Hyperlipidemia    Hypertension    Migraine    Morbid obesity (HCC)    Morbid obesity with BMI of 70 and over, adult (HCC) 11/28/2023   Myocardial infarction (HCC) 1996   light one   Non-ST elevation (NSTEMI) myocardial infarction (HCC) 07/22/2020   PE (pulmonary embolism) 04/2013   On chronic Xarelto    Peripheral nerve disease    Pneumonia several times   Sleep apnea    Type II diabetes mellitus (HCC)     History of Present Illness  keary ORN Stiner is a 53 y.o. male with PMH of hx of CAD s/p dLCx and s/p CABG in 2021(LIMA-->LAD, SVG-->PDA, OM1, D1), PAD with stents to the R extremity with prior R and L toe amputations, chronic ulcer of both feet great toe, COPD with  significant prior smoking history, HTN, HLD, ischemic cardiomyopathy, DM2 with diabetes neuropathy, prior hx of morbid Obesity, OSA unclear if on CPAP, hx of DVT and PE on Xarelto  who presents today for hospital follow-up.   Mr. Consuegra has a PMH of PE with CAD with BMS to distal circumflex and CABG completed in 07/2020 with (LIMA-->LAD, SVG-->PDA, OM1, D1).  He had 2 stents placed to popliteal and left SFA with partial amputation of his foot.  He underwent left below-knee amputation in 12/2021. He was admitted 05/01/2023 for exacerbation of CHF. He was given Lasix  with his significant edema was seen by nephrology for possible nephrotic syndrome. Creatinine was 2.54 and patient had complaint of chest pain with reassuring troponins completed. He was discharged with plan for diuresis and nephrology follow-up.  He was seen by Glendia Ferrier, PA on 04/25/2023 he was scheduled for cardiac follow-up on 06/07/2023 however was a no-show.  He was admitted on 10/01/2023 after suffering a fall and admitted on 11/26/2023 with lower abdominal pain and hematuria.  He was admitted and treated for emphysematous cystitis with 14-day course of antibiotics and Foley catheter in place with urology follow-up.  Hospitalization was prolonged due to hyperkalemia and was treated with Lokelma .     He was readmitted on 01/07/2024 with complaint of chest pain he endorsed with and without exertion.  He also  endorsed shortness of breath and fatigue.  Chest x-ray was completed and did not show any acute findings and WBCs were elevated at 18.6.  He was started on ceftriaxone  for cellulitis of the left BKA stump and had Foley catheter placed due to urinary retention.  Creatinine was 3 on arrival with reduction to 1.2 prior to discharge.  He was advised to follow-up with [facility] and was given Lokelma  for potassium of 5.3.  Blood pressure was found to be stable and diuretics were held in the setting of euvolemia and elevated creatinine.  He was  admitted via EMS with complaint of shortness of breath that was worse with lying down and exertional.  He was also found to have peripheral edema and orthopnea.  He was noted to have 2+ pitting bilateral in the thighs and BNP was 90.4 with chest x-ray showing cardiomegaly and vascular congestion.  EKG showed heart rate of 106 bpm with poor R wave progression and no significant T wave changes.  He was treated with IV torsemide  and hospitalization prolonged due to acute tubular necrosis with cardiorenal syndrome.  The echo was completed showing EF of 50-60% with mild LVH and RV systolic function preserved.  Improvement to symptoms following diuresis and was discharged with furosemide  80 mg twice daily.  He noted no chest pain during hospital admission and 2D echo was completed showing EF of 55 to 60% with no RWMA and mild concentric LVH with trivial MVR and bicuspid fused aortic valve with calcification present without evidence of stenosis and mild dilation of the ascending aorta at 41 mm.  Mr. Demore presents today for posthospital follow-up. He reports since his hospitalization experiencing chest pain three to four times a week, which can occur at rest and is sometimes relieved by nitroglycerin . The pain is described as a tightness or pressure, sometimes accompanied by a sensation of someone 'digging his fingers in'. He has been taking ranolazine  500 mg twice a day, which he has not increased despite the frequency of chest pain. He also reports episodes of elevated blood pressure, notably reaching 210/125 mmHg, often associated with chest pain. He has a history of urinary retention and reports difficulty passing urine, stating that he uses urinals but only fills them partially. He was previously on tamsulosin  but stopped due to low blood pressure. He notes that his urinary output is less than his intake. He has been hospitalized multiple times over the past year for various issues including falls, abdominal pain,  blood in urine, chest pain, and shortness of breath. During these admissions, he was treated for cystitis, cellulitis, and acute kidney issues. He was discharged with Lasix  80 mg twice a day and has been managing his fluid intake carefully. He reports a history of high potassium levels, which were managed with medication to bind potassium. He has also experienced episodes of shortness of breath, orthopnea, and edema, which were treated with diuretics. He has a history of aortic valve issues, specifically a bicuspid valve. He has a history of coronary artery disease, having undergone bypass surgery in 2021 and a heart catheterization in 2023. He has also had stents placed in his leg following an amputation. He reports ongoing issues with fluid retention and kidney function, which have been a focus of his recent medical care.  Patient denies palpitations, dyspnea, PND, orthopnea, nausea, vomiting, dizziness, syncope, edema, weight gain, or early satiety.  Discussed the use of AI scribe software for clinical note transcription with the patient, who gave verbal consent  to proceed.  History of Present Illness   Review of Systems  Please see the history of present illness.    All other systems reviewed and are otherwise negative except as noted above.  Physical Exam     Wt Readings from Last 3 Encounters:  04/06/24 (!) 380 lb 1.2 oz (172.4 kg)  01/07/24 (!) 382 lb 8 oz (173.5 kg)  11/27/23 (!) 382 lb 8 oz (173.5 kg)   CD:Uyzmz were no vitals filed for this visit.,There is no height or weight on file to calculate BMI. GEN: Obese, well developed in no acute distress Neck: No JVD; No carotid bruits Pulmonary: Clear to auscultation without rales, wheezing or rhonchi  Cardiovascular: Normal rate. Regular rhythm. Normal S1. Normal S2.   Murmurs: Systolic murmur present at RUSB ABDOMEN: Soft, non-tender, non-distended EXTREMITIES:  No edema at bilateral stumps or thighs  EKG/LABS/ Recent Cardiac  Studies   ECG personally reviewed by me today -none completed today  Risk Assessment/Calculations:          Lab Results  Component Value Date   WBC 7.5 04/04/2024   HGB 12.8 (L) 04/04/2024   HCT 38.8 (L) 04/04/2024   MCV 82.2 04/04/2024   PLT 253 04/04/2024   Lab Results  Component Value Date   CREATININE 2.44 (H) 04/06/2024   BUN 59 (H) 04/06/2024   NA 142 04/06/2024   K 4.9 04/06/2024   CL 99 04/06/2024   CO2 32 04/06/2024   Lab Results  Component Value Date   CHOL 136 05/02/2023   HDL 40 (L) 05/02/2023   LDLCALC 78 05/02/2023   LDLDIRECT 120.0 05/28/2015   TRIG 89 05/02/2023   CHOLHDL 3.4 05/02/2023    Lab Results  Component Value Date   HGBA1C 8.1 (H) 01/07/2024   Assessment & Plan   Assessment & Plan    1.Chest pain: Intermittent chest pain, relieved by nitroglycerin , possibly due to cardiac ischemia or aortic valve disorder. Informed consent for Lexiscan  nuclear stress test obtained. - Order Lexiscan  nuclear stress test to evaluate for cardiac ischemia. - Consider increasing ranolazine  to 1000 mg twice a day if chest pain becomes more frequent.  2.HFpEF: -Patient's last 2D echo was completed with EF of 55-60, no RWMA, moderate LVH, GRII DD, normal RVSF, trivial MR,bicuspid fused aortic valve with calcification present without evidence of stenosis  - Fluid volume status difficult to ascertain due to body habitus. -Lungs were clear to auscultation during exam with no shortness of breath currently - Reports compliance with Lasix  80 mg twice daily but notes urinary retention and poor output - We will check BNP and BMET today - We will make adjustments to diuretics based on lab results and patient advised to monitor salt intake.  3.Coronary artery disease: -s/p CABG in 2021 with most recent right and left heart catheterization completed with clear grafts and normal RV pressure. -Patient reports ongoing recurrent chest pain with hypertension. -We will order  Lexiscan  Myoview  to rule out new ischemia -Patient's BP has been labile and will resume midodrine  5 mg with plan to increase ranolazine  if discomfort persist.   3.Bicuspid aortic valve: -Patient had diastolic murmur auscultated on examination today. -2D echo recently completed showing no evidence of persistent aortic stenosis -Continue Lasix    4.Nephrotic syndrome: -Chronic kidney disease with creatinine levels of 2.3-2.4 mg/dL. Fluid retention may impact kidney function. - Check kidney function with BMET - Monitor fluid intake and output closely -Patient advised to follow-up with nephrology   5.  Urinary  retention Ongoing difficulty with micturition, potential bladder infections. No current urology follow-up. - Contact urologist to discuss urinary retention and potential restart of tamsulosin  or alternative medication.  6. Hypertension: Blood pressure fluctuates with episodes of hypertension linked to chest pain. Recent high reading of 210/125 mmHg. - Restart midodrine  at 5 mg twice a day to manage hypotension.   Disposition: Follow-up with Oneil Parchment, MD or APP in 3 months Informed Consent   Shared Decision Making/Informed Consent The risks [chest pain, shortness of breath, cardiac arrhythmias, dizziness, blood pressure fluctuations, myocardial infarction, stroke/transient ischemic attack, nausea, vomiting, allergic reaction, radiation exposure, metallic taste sensation and life-threatening complications (estimated to be 1 in 10,000)], benefits (risk stratification, diagnosing coronary artery disease, treatment guidance) and alternatives of a nuclear stress test were discussed in detail with Mr. Renaldo and he agrees to proceed.     Signed, Wyn Raddle, Jackee Shove, NP 04/26/2024, 7:50 AM [facility] "

## 2024-04-27 ENCOUNTER — Other Ambulatory Visit: Payer: Self-pay

## 2024-04-27 ENCOUNTER — Other Ambulatory Visit (HOSPITAL_COMMUNITY): Payer: Self-pay

## 2024-04-27 ENCOUNTER — Encounter: Payer: Self-pay | Admitting: Nurse Practitioner

## 2024-04-27 ENCOUNTER — Ambulatory Visit: Attending: Nurse Practitioner | Admitting: Nurse Practitioner

## 2024-04-27 VITALS — BP 96/64 | HR 92 | Ht 61.0 in | Wt 360.0 lb

## 2024-04-27 DIAGNOSIS — N049 Nephrotic syndrome with unspecified morphologic changes: Secondary | ICD-10-CM

## 2024-04-27 DIAGNOSIS — R079 Chest pain, unspecified: Secondary | ICD-10-CM

## 2024-04-27 DIAGNOSIS — R338 Other retention of urine: Secondary | ICD-10-CM

## 2024-04-27 DIAGNOSIS — I25119 Atherosclerotic heart disease of native coronary artery with unspecified angina pectoris: Secondary | ICD-10-CM | POA: Diagnosis not present

## 2024-04-27 DIAGNOSIS — Q2381 Bicuspid aortic valve: Secondary | ICD-10-CM | POA: Diagnosis not present

## 2024-04-27 DIAGNOSIS — S88112D Complete traumatic amputation at level between knee and ankle, left lower leg, subsequent encounter: Secondary | ICD-10-CM

## 2024-04-27 DIAGNOSIS — I10 Essential (primary) hypertension: Secondary | ICD-10-CM

## 2024-04-27 DIAGNOSIS — I5032 Chronic diastolic (congestive) heart failure: Secondary | ICD-10-CM

## 2024-04-27 LAB — CBC

## 2024-04-27 MED ORDER — ICOSAPENT ETHYL 1 G PO CAPS
2.0000 g | ORAL_CAPSULE | Freq: Two times a day (BID) | ORAL | 3 refills | Status: DC
  Filled 2024-04-27 – 2024-05-17 (×4): qty 360, 90d supply, fill #0
  Filled 2024-05-18: qty 120, 30d supply, fill #0

## 2024-04-27 MED ORDER — OMRON 3 SERIES BP MONITOR DEVI
1.0000 | Freq: Every day | 0 refills | Status: DC
  Filled 2024-04-27: qty 1, 30d supply, fill #0
  Filled 2024-05-31: qty 1, fill #0

## 2024-04-27 MED ORDER — ATORVASTATIN CALCIUM 80 MG PO TABS
80.0000 mg | ORAL_TABLET | Freq: Every day | ORAL | 3 refills | Status: AC
Start: 2024-04-27 — End: ?
  Filled 2024-04-27 – 2024-05-10 (×3): qty 90, 90d supply, fill #0
  Filled 2024-08-12: qty 90, 90d supply, fill #1
  Filled 2024-09-24 – 2024-11-10 (×2): qty 90, 90d supply, fill #2

## 2024-04-27 MED ORDER — FUROSEMIDE 80 MG PO TABS
80.0000 mg | ORAL_TABLET | Freq: Two times a day (BID) | ORAL | 3 refills | Status: AC
  Filled 2024-04-27 – 2024-05-21 (×5): qty 180, 90d supply, fill #0
  Filled 2024-11-09: qty 180, 90d supply, fill #1

## 2024-04-27 MED ORDER — MIDODRINE HCL 5 MG PO TABS
5.0000 mg | ORAL_TABLET | Freq: Two times a day (BID) | ORAL | 1 refills | Status: AC
  Filled 2024-04-27: qty 180, 90d supply, fill #0

## 2024-04-27 MED ORDER — CLOPIDOGREL BISULFATE 75 MG PO TABS
75.0000 mg | ORAL_TABLET | Freq: Every day | ORAL | 3 refills | Status: DC
  Filled 2024-04-27 – 2024-05-08 (×2): qty 90, 90d supply, fill #0

## 2024-04-27 NOTE — Patient Instructions (Signed)
 Medication Instructions:  DECREASE Midodrine  to 5mg  Take 1 tablet twice a day Blood pressure cuff prescription being sent downstairs *If you need a refill on your cardiac medications before your next appointment, please call your pharmacy*  Lab Work: TODAY- BMET, BNP, CBC If you have labs (blood work) drawn today and your tests are completely normal, you will receive your results only by: MyChart Message (if you have MyChart) OR A paper copy in the mail If you have any lab test that is abnormal or we need to change your treatment, we will call you to review the results.  Testing/Procedures: Your physician has requested that you have a lexiscan  myoview . For further information please visit [email or web address] Please follow instruction sheet, as given.   Follow-Up: At [facility], you and your health needs are our priority.  As part of our continuing mission to provide you with exceptional heart care, our providers are all part of one team.  This team includes your primary Cardiologist (physician) and Advanced Practice Providers or APPs (Physician Assistants and Nurse Practitioners) who all work together to provide you with the care you need, when you need it.  Your next appointment:   3 month(s)  Provider:   Charles Connor, NP or Marlyse Single, PA-C        We recommend signing up for the patient portal called "MyChart".  Sign up information is provided on this After Visit Summary.  MyChart is used to connect with patients for Virtual Visits (Telemedicine).  Patients are able to view lab/test results, encounter notes, upcoming appointments, etc.  Non-urgent messages can be sent to your provider as well.   To learn more about what you can do with MyChart, go to [email or web address].   Other Instructions Contact your urology provider

## 2024-04-28 LAB — CBC
Hematocrit: 39 % (ref 37.5–51.0)
Hemoglobin: 12.7 g/dL — ABNORMAL LOW (ref 13.0–17.7)
MCH: 27.3 pg (ref 26.6–33.0)
MCHC: 32.6 g/dL (ref 31.5–35.7)
MCV: 84 fL (ref 79–97)
Platelets: 242 10*3/uL (ref 150–450)
RBC: 4.66 x10E6/uL (ref 4.14–5.80)
RDW: 14.3 % (ref 11.6–15.4)
WBC: 7.4 10*3/uL (ref 3.4–10.8)

## 2024-04-28 LAB — BASIC METABOLIC PANEL WITH GFR
BUN/Creatinine Ratio: 17 (ref 9–20)
BUN: 28 mg/dL — ABNORMAL HIGH (ref 6–24)
CO2: 22 mmol/L (ref 20–29)
Calcium: 8.3 mg/dL — ABNORMAL LOW (ref 8.7–10.2)
Chloride: 103 mmol/L (ref 96–106)
Creatinine, Ser: 1.61 mg/dL — ABNORMAL HIGH (ref 0.76–1.27)
Glucose: 248 mg/dL — ABNORMAL HIGH (ref 70–99)
Potassium: 4.1 mmol/L (ref 3.5–5.2)
Sodium: 142 mmol/L (ref 134–144)
eGFR: 51 mL/min/{1.73_m2} — ABNORMAL LOW (ref >59)

## 2024-04-28 LAB — PRO B NATRIURETIC PEPTIDE: NT-Pro BNP: 895 pg/mL — ABNORMAL HIGH (ref 0–121)

## 2024-04-30 ENCOUNTER — Other Ambulatory Visit: Payer: Self-pay | Admitting: Physician Assistant

## 2024-04-30 ENCOUNTER — Other Ambulatory Visit: Payer: Self-pay | Admitting: Cardiovascular Disease

## 2024-05-01 ENCOUNTER — Ambulatory Visit: Payer: Self-pay

## 2024-05-01 ENCOUNTER — Encounter (HOSPITAL_COMMUNITY): Payer: Self-pay

## 2024-05-04 ENCOUNTER — Telehealth (HOSPITAL_COMMUNITY): Payer: Self-pay | Admitting: *Deleted

## 2024-05-04 ENCOUNTER — Other Ambulatory Visit: Payer: Self-pay | Admitting: Nurse Practitioner

## 2024-05-04 DIAGNOSIS — I25119 Atherosclerotic heart disease of native coronary artery with unspecified angina pectoris: Secondary | ICD-10-CM

## 2024-05-04 DIAGNOSIS — S88112D Complete traumatic amputation at level between knee and ankle, left lower leg, subsequent encounter: Secondary | ICD-10-CM

## 2024-05-04 DIAGNOSIS — I5032 Chronic diastolic (congestive) heart failure: Secondary | ICD-10-CM

## 2024-05-04 DIAGNOSIS — Q2381 Bicuspid aortic valve: Secondary | ICD-10-CM

## 2024-05-04 NOTE — Telephone Encounter (Signed)
 Pt given instructions for mpi study.

## 2024-05-07 ENCOUNTER — Other Ambulatory Visit (HOSPITAL_COMMUNITY): Payer: Self-pay

## 2024-05-08 ENCOUNTER — Other Ambulatory Visit (HOSPITAL_COMMUNITY): Payer: Self-pay

## 2024-05-09 ENCOUNTER — Other Ambulatory Visit (HOSPITAL_COMMUNITY): Payer: Self-pay

## 2024-05-09 ENCOUNTER — Ambulatory Visit (HOSPITAL_COMMUNITY)
Admission: RE | Admit: 2024-05-09 | Discharge: 2024-05-09 | Disposition: A | Discharge status: Home / Self Care | Source: Ambulatory Visit | Attending: Cardiovascular Disease | Admitting: Cardiovascular Disease

## 2024-05-09 DIAGNOSIS — I25119 Atherosclerotic heart disease of native coronary artery with unspecified angina pectoris: Secondary | ICD-10-CM

## 2024-05-09 DIAGNOSIS — I5032 Chronic diastolic (congestive) heart failure: Secondary | ICD-10-CM | POA: Diagnosis present

## 2024-05-09 DIAGNOSIS — Z89512 Acquired absence of left leg below knee: Secondary | ICD-10-CM

## 2024-05-09 DIAGNOSIS — Q2381 Bicuspid aortic valve: Secondary | ICD-10-CM | POA: Diagnosis present

## 2024-05-09 DIAGNOSIS — S88112D Complete traumatic amputation at level between knee and ankle, left lower leg, subsequent encounter: Secondary | ICD-10-CM | POA: Insufficient documentation

## 2024-05-09 LAB — MYOCARDIAL PERFUSION IMAGING
LV dias vol: 164 mL (ref 62–150)
LV sys vol: 104 mL
Nuc Stress EF: 37 %
Peak HR: 117 {beats}/min
Rest HR: 103 {beats}/min
Rest Nuclear Isotope Dose: 11 mCi
SDS: 2
SRS: 2
SSS: 2
ST Depression (mm): 0 mm
Stress Nuclear Isotope Dose: 32.7 mCi
TID: 1.08

## 2024-05-09 MED ORDER — TECHNETIUM TC 99M TETROFOSMIN IV KIT
32.7000 | PACK | Freq: Once | INTRAVENOUS | Status: AC | PRN
  Administered 2024-05-09: 32.7 via INTRAVENOUS

## 2024-05-09 MED ORDER — TECHNETIUM TC 99M TETROFOSMIN IV KIT
12.6000 | PACK | Freq: Once | INTRAVENOUS | Status: AC | PRN
  Administered 2024-05-09: 12.6 via INTRAVENOUS

## 2024-05-09 MED ORDER — REGADENOSON 0.4 MG/5ML IV SOLN
INTRAVENOUS | Status: AC
  Filled 2024-05-09: qty 5

## 2024-05-09 MED ORDER — REGADENOSON 0.4 MG/5ML IV SOLN
0.4000 mg | Freq: Once | INTRAVENOUS | Status: AC
  Administered 2024-05-09: 0.4 mg via INTRAVENOUS

## 2024-05-10 ENCOUNTER — Other Ambulatory Visit: Payer: Self-pay

## 2024-05-10 ENCOUNTER — Telehealth: Payer: Self-pay | Admitting: Cardiology

## 2024-05-10 ENCOUNTER — Other Ambulatory Visit (HOSPITAL_COMMUNITY): Payer: Self-pay

## 2024-05-10 MED ORDER — CEPHALEXIN 500 MG PO CAPS
500.0000 mg | ORAL_CAPSULE | Freq: Two times a day (BID) | ORAL | 1 refills | Status: DC
  Filled 2024-05-10: qty 20, 10d supply, fill #0
  Filled 2024-07-19: qty 20, 10d supply, fill #1

## 2024-05-10 MED ORDER — OXYCODONE HCL 15 MG PO TABS
15.0000 mg | ORAL_TABLET | ORAL | 0 refills | Status: DC
  Filled 2024-05-10: qty 150, 25d supply, fill #0

## 2024-05-10 MED ORDER — CLOPIDOGREL BISULFATE 75 MG PO TABS: 75.0000 mg | ORAL_TABLET | Freq: Every day | ORAL | 1 refills | Status: AC

## 2024-05-10 MED ORDER — SILVER SULFADIAZINE 1 % EX CREA
1.0000 | TOPICAL_CREAM | Freq: Every day | CUTANEOUS | 11 refills | Status: AC
  Filled 2024-05-10 – 2024-05-11 (×2): qty 50, 15d supply, fill #0
  Filled 2024-07-30: qty 100, 30d supply, fill #1
  Filled 2024-09-29: qty 100, 30d supply, fill #2
  Filled 2024-11-09: qty 100, 30d supply, fill #3

## 2024-05-10 MED ORDER — ALPRAZOLAM 1 MG PO TABS
1.0000 mg | ORAL_TABLET | Freq: Two times a day (BID) | ORAL | 0 refills | Status: DC | PRN
  Filled 2024-05-18: qty 60, 15d supply, fill #0

## 2024-05-10 NOTE — Telephone Encounter (Signed)
*  STAT* If patient is at the pharmacy, call can be transferred to refill team.   1. Which medications need to be refilled? (please list name of each medication and dose if known) clopidogrel  (PLAVIX ) 75 MG tablet   2. Which pharmacy/location (including street and city if local pharmacy) is medication to be sent to?  [facility] - [place] [address]    3. Do they need a 30 day or 90 day supply? 90

## 2024-05-10 NOTE — Telephone Encounter (Signed)
Refills has been sent to he pharmacy.

## 2024-05-11 ENCOUNTER — Other Ambulatory Visit (HOSPITAL_COMMUNITY): Payer: Self-pay

## 2024-05-15 ENCOUNTER — Telehealth: Payer: Self-pay | Admitting: Cardiology

## 2024-05-15 ENCOUNTER — Other Ambulatory Visit (HOSPITAL_COMMUNITY): Payer: Self-pay

## 2024-05-15 ENCOUNTER — Encounter (HOSPITAL_COMMUNITY): Payer: Self-pay

## 2024-05-15 NOTE — Telephone Encounter (Signed)
 Pt returning nurse call in regards to results. Please advise

## 2024-05-16 ENCOUNTER — Other Ambulatory Visit (HOSPITAL_COMMUNITY): Payer: Self-pay

## 2024-05-16 ENCOUNTER — Other Ambulatory Visit: Payer: Self-pay

## 2024-05-16 MED ORDER — RANOLAZINE ER 1000 MG PO TB12: 1000.0000 mg | ORAL_TABLET | Freq: Two times a day (BID) | ORAL | 1 refills | Status: DC

## 2024-05-16 MED ORDER — INSULIN ASPART 100 UNIT/ML FLEXPEN
10.0000 [IU] | PEN_INJECTOR | Freq: Three times a day (TID) | SUBCUTANEOUS | 3 refills | Status: DC
  Filled 2024-05-16 – 2024-10-29 (×3): qty 9, 30d supply, fill #0

## 2024-05-17 ENCOUNTER — Other Ambulatory Visit (HOSPITAL_COMMUNITY): Payer: Self-pay

## 2024-05-17 ENCOUNTER — Other Ambulatory Visit: Payer: Self-pay

## 2024-05-17 MED ORDER — INSULIN LISPRO (1 UNIT DIAL) 100 UNIT/ML (KWIKPEN)
12.0000 [IU] | PEN_INJECTOR | Freq: Three times a day (TID) | SUBCUTANEOUS | 11 refills | Status: DC
  Filled 2024-05-17: qty 18, 40d supply, fill #0
  Filled 2024-05-29 – 2024-06-07 (×3): qty 18, 40d supply, fill #1
  Filled 2024-06-13: qty 15, 42d supply, fill #1
  Filled 2024-06-13 (×2): qty 18, 40d supply, fill #1
  Filled 2024-06-16: qty 18, 40d supply, fill #0
  Filled 2024-07-19: qty 18, 40d supply, fill #1
  Filled 2024-07-20: qty 18, 40d supply, fill #0
  Filled 2024-08-07 – 2024-08-21 (×3): qty 18, 40d supply, fill #1
  Filled 2024-09-24: qty 18, 40d supply, fill #2
  Filled 2024-11-19 – 2024-12-02 (×3): qty 18, 40d supply, fill #3
  Filled ????-??-??: fill #3

## 2024-05-17 NOTE — Telephone Encounter (Signed)
 Patient was notified directly yesterday of test results.

## 2024-05-18 ENCOUNTER — Other Ambulatory Visit (HOSPITAL_COMMUNITY): Payer: Self-pay

## 2024-05-18 ENCOUNTER — Other Ambulatory Visit: Payer: Self-pay

## 2024-05-21 ENCOUNTER — Other Ambulatory Visit: Payer: Self-pay

## 2024-05-21 ENCOUNTER — Other Ambulatory Visit (HOSPITAL_COMMUNITY): Payer: Self-pay

## 2024-05-23 ENCOUNTER — Other Ambulatory Visit: Payer: Self-pay | Admitting: Cardiovascular Disease

## 2024-05-23 ENCOUNTER — Other Ambulatory Visit: Payer: Self-pay | Admitting: Physician Assistant

## 2024-05-28 ENCOUNTER — Other Ambulatory Visit (HOSPITAL_COMMUNITY): Payer: Self-pay

## 2024-05-29 ENCOUNTER — Other Ambulatory Visit (HOSPITAL_COMMUNITY): Payer: Self-pay

## 2024-05-29 ENCOUNTER — Other Ambulatory Visit: Payer: Self-pay | Admitting: Physician Assistant

## 2024-05-30 ENCOUNTER — Other Ambulatory Visit (HOSPITAL_COMMUNITY): Payer: Self-pay

## 2024-05-31 ENCOUNTER — Other Ambulatory Visit (HOSPITAL_COMMUNITY): Payer: Self-pay

## 2024-05-31 MED ORDER — OXYCODONE HCL 15 MG PO TABS
15.0000 mg | ORAL_TABLET | ORAL | 0 refills | Status: DC
  Filled 2024-06-01: qty 100, 20d supply, fill #0
  Filled 2024-06-01: qty 150, 25d supply, fill #0
  Filled ????-??-??: fill #0

## 2024-05-31 MED ORDER — ALPRAZOLAM 1 MG PO TABS
1.0000 mg | ORAL_TABLET | Freq: Two times a day (BID) | ORAL | 0 refills | Status: DC | PRN
  Filled 2024-05-31: qty 60, 30d supply, fill #0
  Filled 2024-05-31 – 2024-06-01 (×3): qty 60, 15d supply, fill #0
  Filled ????-??-??: fill #0

## 2024-06-01 ENCOUNTER — Other Ambulatory Visit: Payer: Self-pay

## 2024-06-01 ENCOUNTER — Other Ambulatory Visit (HOSPITAL_COMMUNITY): Payer: Self-pay

## 2024-06-08 ENCOUNTER — Other Ambulatory Visit (HOSPITAL_COMMUNITY): Payer: Self-pay

## 2024-06-11 ENCOUNTER — Other Ambulatory Visit (HOSPITAL_COMMUNITY): Payer: Self-pay

## 2024-06-13 ENCOUNTER — Other Ambulatory Visit: Payer: Self-pay

## 2024-06-13 ENCOUNTER — Other Ambulatory Visit (HOSPITAL_COMMUNITY): Payer: Self-pay

## 2024-06-13 MED ORDER — NALOXONE HCL 4 MG/0.1ML NA LIQD
1.0000 | NASAL | 11 refills | Status: AC | PRN
  Filled 2024-06-13: qty 2, 2d supply, fill #0

## 2024-06-13 MED ORDER — ALPRAZOLAM 2 MG PO TABS
2.0000 mg | ORAL_TABLET | Freq: Two times a day (BID) | ORAL | 0 refills | Status: DC
  Filled 2024-06-13 – 2024-06-15 (×3): qty 60, 30d supply, fill #0

## 2024-06-13 MED ORDER — OXYCODONE HCL 15 MG PO TABS
15.0000 mg | ORAL_TABLET | ORAL | 0 refills | Status: DC
  Filled 2024-06-13: qty 150, 150d supply, fill #0
  Filled 2024-06-20: qty 150, 30d supply, fill #0
  Filled ????-??-??: fill #0

## 2024-06-14 ENCOUNTER — Other Ambulatory Visit (HOSPITAL_COMMUNITY): Payer: Self-pay

## 2024-06-15 ENCOUNTER — Other Ambulatory Visit (HOSPITAL_COMMUNITY): Payer: Self-pay

## 2024-06-16 ENCOUNTER — Other Ambulatory Visit (HOSPITAL_COMMUNITY): Payer: Self-pay

## 2024-06-18 ENCOUNTER — Other Ambulatory Visit (HOSPITAL_COMMUNITY): Payer: Self-pay

## 2024-06-20 ENCOUNTER — Other Ambulatory Visit (HOSPITAL_COMMUNITY): Payer: Self-pay

## 2024-06-21 ENCOUNTER — Other Ambulatory Visit: Payer: Self-pay | Admitting: Physician Assistant

## 2024-06-29 ENCOUNTER — Other Ambulatory Visit: Payer: Self-pay | Admitting: Cardiovascular Disease

## 2024-07-09 ENCOUNTER — Other Ambulatory Visit (HOSPITAL_COMMUNITY): Payer: Self-pay

## 2024-07-09 MED ORDER — PANTOPRAZOLE SODIUM 40 MG PO TBEC
40.0000 mg | DELAYED_RELEASE_TABLET | Freq: Two times a day (BID) | ORAL | 3 refills | Status: AC
  Filled 2024-07-19: qty 180, 90d supply, fill #0
  Filled 2024-08-27 – 2024-11-09 (×3): qty 180, 90d supply, fill #1

## 2024-07-09 MED ORDER — ALPRAZOLAM 2 MG PO TABS
2.0000 mg | ORAL_TABLET | Freq: Two times a day (BID) | ORAL | 0 refills | Status: DC
  Filled 2024-07-09 – 2024-07-19 (×2): qty 60, 30d supply, fill #0

## 2024-07-09 MED ORDER — OXYCODONE HCL 15 MG PO TABS
15.0000 mg | ORAL_TABLET | ORAL | 0 refills | Status: DC
  Filled 2024-07-09: qty 150, 150d supply, fill #0
  Filled 2024-07-13: qty 150, 25d supply, fill #0
  Filled 2024-07-19: qty 150, 26d supply, fill #0
  Filled 2024-07-20: qty 150, 30d supply, fill #0

## 2024-07-13 ENCOUNTER — Other Ambulatory Visit (HOSPITAL_COMMUNITY): Payer: Self-pay

## 2024-07-17 ENCOUNTER — Encounter (HOSPITAL_COMMUNITY): Payer: Self-pay

## 2024-07-17 ENCOUNTER — Emergency Department (HOSPITAL_COMMUNITY)

## 2024-07-17 ENCOUNTER — Emergency Department (HOSPITAL_COMMUNITY)
Admission: EM | Admit: 2024-07-17 | Discharge: 2024-07-18 | Disposition: A | Discharge status: Home / Self Care | Attending: Emergency Medicine | Admitting: Emergency Medicine

## 2024-07-17 ENCOUNTER — Other Ambulatory Visit: Payer: Self-pay

## 2024-07-17 DIAGNOSIS — I130 Hypertensive heart and chronic kidney disease with heart failure and stage 1 through stage 4 chronic kidney disease, or unspecified chronic kidney disease: Secondary | ICD-10-CM | POA: Diagnosis not present

## 2024-07-17 DIAGNOSIS — I25708 Atherosclerosis of coronary artery bypass graft(s), unspecified, with other forms of angina pectoris: Secondary | ICD-10-CM | POA: Diagnosis not present

## 2024-07-17 DIAGNOSIS — R079 Chest pain, unspecified: Secondary | ICD-10-CM | POA: Diagnosis not present

## 2024-07-17 DIAGNOSIS — Z7982 Long term (current) use of aspirin: Secondary | ICD-10-CM | POA: Insufficient documentation

## 2024-07-17 DIAGNOSIS — M549 Dorsalgia, unspecified: Secondary | ICD-10-CM | POA: Diagnosis present

## 2024-07-17 DIAGNOSIS — S80211A Abrasion, right knee, initial encounter: Secondary | ICD-10-CM | POA: Diagnosis not present

## 2024-07-17 DIAGNOSIS — W1830XA Fall on same level, unspecified, initial encounter: Secondary | ICD-10-CM | POA: Insufficient documentation

## 2024-07-17 DIAGNOSIS — S0990XA Unspecified injury of head, initial encounter: Secondary | ICD-10-CM | POA: Diagnosis not present

## 2024-07-17 DIAGNOSIS — Z7902 Long term (current) use of antithrombotics/antiplatelets: Secondary | ICD-10-CM | POA: Insufficient documentation

## 2024-07-17 DIAGNOSIS — Z794 Long term (current) use of insulin: Secondary | ICD-10-CM | POA: Insufficient documentation

## 2024-07-17 DIAGNOSIS — N184 Chronic kidney disease, stage 4 (severe): Secondary | ICD-10-CM | POA: Diagnosis not present

## 2024-07-17 DIAGNOSIS — S300XXA Contusion of lower back and pelvis, initial encounter: Secondary | ICD-10-CM | POA: Insufficient documentation

## 2024-07-17 DIAGNOSIS — W19XXXA Unspecified fall, initial encounter: Secondary | ICD-10-CM

## 2024-07-17 DIAGNOSIS — N1832 Chronic kidney disease, stage 3b: Secondary | ICD-10-CM | POA: Insufficient documentation

## 2024-07-17 DIAGNOSIS — R0789 Other chest pain: Secondary | ICD-10-CM | POA: Diagnosis not present

## 2024-07-17 LAB — BASIC METABOLIC PANEL WITH GFR
Anion gap: 10 (ref 5–15)
BUN: 35 mg/dL — ABNORMAL HIGH (ref 6–20)
CO2: 24 mmol/L (ref 22–32)
Calcium: 7.9 mg/dL — ABNORMAL LOW (ref 8.9–10.3)
Chloride: 104 mmol/L (ref 98–111)
Creatinine, Ser: 2.59 mg/dL — ABNORMAL HIGH (ref 0.61–1.24)
GFR, Estimated: 29 mL/min — ABNORMAL LOW (ref >60)
Glucose, Bld: 180 mg/dL — ABNORMAL HIGH (ref 70–99)
Potassium: 4.5 mmol/L (ref 3.5–5.1)
Sodium: 138 mmol/L (ref 135–145)

## 2024-07-17 LAB — CBC
HCT: 41 % (ref 39.0–52.0)
Hemoglobin: 13.4 g/dL (ref 13.0–17.0)
MCH: 27.6 pg (ref 26.0–34.0)
MCHC: 32.7 g/dL (ref 30.0–36.0)
MCV: 84.5 fL (ref 80.0–100.0)
Platelets: 259 K/uL (ref 150–400)
RBC: 4.85 MIL/uL (ref 4.22–5.81)
RDW: 13.9 % (ref 11.5–15.5)
WBC: 10.7 K/uL — ABNORMAL HIGH (ref 4.0–10.5)
nRBC: 0 % (ref 0.0–0.2)

## 2024-07-17 LAB — TROPONIN I (HIGH SENSITIVITY): Troponin I (High Sensitivity): 23 ng/L — ABNORMAL HIGH (ref <18)

## 2024-07-17 MED ORDER — LACTATED RINGERS IV BOLUS: 1000.0000 mL | Freq: Once | INTRAVENOUS | Status: DC

## 2024-07-17 NOTE — ED Notes (Signed)
 Pt reporting increased chest pain. Repeat EKG taken and vitals rechecked. EKG given to Kommor, MD.

## 2024-07-17 NOTE — ED Triage Notes (Signed)
 Pt BIB [facility] c/o of chest pain x2 days and dizziness today at the [facility], got sweaty and fell, pt is on blood thinners, denies hitting head. C/o of left flank area. abrasion to right knee.

## 2024-07-17 NOTE — ED Notes (Signed)
 [facility] doc notified of pt.

## 2024-07-17 NOTE — ED Provider Triage Note (Signed)
 Emergency Medicine Provider Triage Evaluation Note  Joseph [name] , a 53 y.o. male  was evaluated in triage.  Pt complains of chest pain.  States that symptoms been worsening today and he had an episode today of diaphoresis where he fell to the ground.  Endorses some mild shortness of breath.  Review of Systems  Positive: Chest pain, shortness of breath Negative: Abdominal pain, nausea, vomiting  Physical Exam  BP 106/80 (BP Location: Left Arm)   Pulse (!) 107   Temp 99 F (37.2 C) (Oral)   Resp 20   Ht 5' 1 (1.549 m)   Wt (!) 154.2 kg   SpO2 94%   BMI 64.24 kg/m  Gen:   Awake, no distress   Resp:  Normal effort  MSK:   Moves extremities without difficulty  Other:    Medical Decision Making  Medically screening exam initiated at 6:10 PM.  Appropriate orders placed.  Joseph [name] was informed that the remainder of the evaluation will be completed by another provider, this initial triage assessment does not replace that evaluation, and the importance of remaining in the ED until their evaluation is complete.     Albertina Dixon, MD 07/17/24 2350

## 2024-07-18 DIAGNOSIS — S300XXA Contusion of lower back and pelvis, initial encounter: Secondary | ICD-10-CM | POA: Diagnosis not present

## 2024-07-18 DIAGNOSIS — R0789 Other chest pain: Secondary | ICD-10-CM

## 2024-07-18 DIAGNOSIS — I25708 Atherosclerosis of coronary artery bypass graft(s), unspecified, with other forms of angina pectoris: Secondary | ICD-10-CM

## 2024-07-18 DIAGNOSIS — N184 Chronic kidney disease, stage 4 (severe): Secondary | ICD-10-CM

## 2024-07-18 LAB — TROPONIN I (HIGH SENSITIVITY): Troponin I (High Sensitivity): 24 ng/L — ABNORMAL HIGH (ref <18)

## 2024-07-18 MED ORDER — ALBUTEROL SULFATE HFA 108 (90 BASE) MCG/ACT IN AERS: 2.0000 | INHALATION_SPRAY | Freq: Four times a day (QID) | RESPIRATORY_TRACT | Status: DC | PRN

## 2024-07-18 MED ORDER — OXYCODONE HCL 5 MG PO TABS
15.0000 mg | ORAL_TABLET | ORAL | Status: AC
  Administered 2024-07-18: 15 mg via ORAL
  Filled 2024-07-18: qty 3

## 2024-07-18 MED ORDER — OXYCODONE HCL 5 MG PO TABS
15.0000 mg | ORAL_TABLET | ORAL | Status: DC
  Administered 2024-07-18: 15 mg via ORAL
  Filled 2024-07-18: qty 3

## 2024-07-18 MED ORDER — FUROSEMIDE 20 MG PO TABS
80.0000 mg | ORAL_TABLET | Freq: Two times a day (BID) | ORAL | Status: DC
  Administered 2024-07-18: 80 mg via ORAL
  Filled 2024-07-18: qty 4

## 2024-07-18 MED ORDER — INSULIN LISPRO (1 UNIT DIAL) 100 UNIT/ML (KWIKPEN): 12.0000 [IU] | PEN_INJECTOR | Freq: Three times a day (TID) | SUBCUTANEOUS | Status: DC

## 2024-07-18 MED ORDER — ATORVASTATIN CALCIUM 40 MG PO TABS: 80.0000 mg | ORAL_TABLET | Freq: Every day | ORAL | Status: DC

## 2024-07-18 MED ORDER — CLOPIDOGREL BISULFATE 75 MG PO TABS
75.0000 mg | ORAL_TABLET | Freq: Every day | ORAL | Status: DC
  Administered 2024-07-18: 75 mg via ORAL
  Filled 2024-07-18: qty 1

## 2024-07-18 MED ORDER — RANOLAZINE ER 500 MG PO TB12: 1000.0000 mg | ORAL_TABLET | Freq: Two times a day (BID) | ORAL | Status: DC

## 2024-07-18 MED ORDER — BUPROPION HCL ER (XL) 150 MG PO TB24: 150.0000 mg | ORAL_TABLET | Freq: Two times a day (BID) | ORAL | Status: DC

## 2024-07-18 MED ORDER — FUROSEMIDE 10 MG/ML IJ SOLN: 60.0000 mg | Freq: Once | INTRAMUSCULAR | Status: DC

## 2024-07-18 MED ORDER — PANTOPRAZOLE SODIUM 40 MG PO TBEC
40.0000 mg | DELAYED_RELEASE_TABLET | Freq: Two times a day (BID) | ORAL | Status: DC
  Administered 2024-07-18: 40 mg via ORAL
  Filled 2024-07-18: qty 1

## 2024-07-18 MED ORDER — TIZANIDINE HCL 4 MG PO TABS: 4.0000 mg | ORAL_TABLET | Freq: Three times a day (TID) | ORAL | Status: DC

## 2024-07-18 MED ORDER — INSULIN ASPART 100 UNIT/ML IJ SOLN: 12.0000 [IU] | Freq: Three times a day (TID) | INTRAMUSCULAR | Status: DC

## 2024-07-18 MED ORDER — PREGABALIN 100 MG PO CAPS: 100.0000 mg | ORAL_CAPSULE | Freq: Two times a day (BID) | ORAL | Status: DC

## 2024-07-18 MED ORDER — ASPIRIN 81 MG PO CHEW
324.0000 mg | CHEWABLE_TABLET | Freq: Once | ORAL | Status: AC
  Administered 2024-07-18: 324 mg via ORAL
  Filled 2024-07-18: qty 4

## 2024-07-18 MED ORDER — INSULIN GLARGINE-YFGN 100 UNIT/ML ~~LOC~~ SOLN
38.0000 [IU] | Freq: Two times a day (BID) | SUBCUTANEOUS | Status: DC
  Administered 2024-07-18: 38 [IU] via SUBCUTANEOUS
  Filled 2024-07-18 (×3): qty 0.38

## 2024-07-18 NOTE — ED Provider Notes (Signed)
 [place] EMERGENCY DEPARTMENT AT [facility] Provider Note   CSN: [id number] Arrival date & time: 07/17/24  1548     Patient presents with: Chest Pain   Joseph [name] is a 53 y.o. male.   Patient presents to the emergency department for evaluation of pain.  Patient reports that he had a fall earlier today.  Patient reports that he got sweaty and this caused him to have a fall because of his prosthetic leg.  Patient complaining right flank pain and right knee injury after the fall.  Patient complaining of chest pain.  He reports that it really started after he fell.       Prior to Admission medications   Medication Sig Start Date End Date Taking? Authorizing Provider  acetaminophen  (TYLENOL ) 500 MG tablet Take 1,000 mg by mouth 3 (three) times daily as needed for headache.    [provider]  albuterol  (VENTOLIN  HFA) 108 (90 Base) MCG/ACT inhaler Inhale 2 puffs into the lungs every 6 (six) hours as needed for shortness of breath.    [provider]  ALPRAZolam  (XANAX ) 1 MG tablet Take 1 tablet (1 mg total) by mouth 2 (two) times daily as needed. Patient taking differently: Take 1 mg by mouth at bedtime. May take an additional tablet twice a day as needed for anxiety 03/26/24     ALPRAZolam  (XANAX ) 1 MG tablet Take 1-2 tablets (1-2 mg total) by mouth 2 (two) times daily as needed. 05/10/24     ALPRAZolam  (XANAX ) 1 MG tablet Take 1-2 tablets (1-2 mg total) by mouth 2 (two) times daily as needed. 05/31/24     alprazolam  (XANAX ) 2 MG tablet Take 1 tablet (2 mg total) by mouth 2 (two) times daily. 07/09/24     aspirin  EC 81 MG tablet Take 81 mg by mouth in the morning. Swallow whole.    [provider]  atorvastatin  (LIPITOR ) 80 MG tablet Take 1 tablet (80 mg total) by mouth daily. 04/27/24   Wyn Jackee VEAR Mickey., NP  Blood Pressure Monitoring (OMRON 3 SERIES BP MONITOR) DEVI Ues as directed. 04/27/24   Wyn Jackee VEAR Mickey., NP  buPROPion  (WELLBUTRIN  XL) 150 MG  24 hr tablet Take 150 mg by mouth in the morning and at bedtime.    [provider]  cephALEXin  (KEFLEX ) 500 MG capsule Take 1 capsule (500 mg total) by mouth 2 (two) times daily for 10 days. 05/10/24     clopidogrel  (PLAVIX ) 75 MG tablet Take 1 tablet (75 mg total) by mouth daily. 05/10/24   Wyn Jackee VEAR Mickey., NP  Continuous Glucose Sensor (FREESTYLE LIBRE 3 PLUS SENSOR) MISC Use as directed to monitor blood sugar. Change every 15 days. 04/19/24     escitalopram  (LEXAPRO ) 10 MG tablet Take 10 mg by mouth daily. 01/27/22   [provider]  fluticasone  (FLONASE ) 50 MCG/ACT nasal spray Place 2 sprays into both nostrils daily.    [provider]  furosemide  (LASIX ) 80 MG tablet Take 1 tablet (80 mg total) by mouth 2 (two) times daily. 04/27/24   Wyn Jackee VEAR Mickey., NP  insulin  aspart (NOVOLOG  FLEXPEN) 100 UNIT/ML FlexPen Inject 10 Units into the skin 3 (three) times daily before meals. 05/16/24     insulin  aspart (NOVOLOG ) 100 UNIT/ML injection Inject 8 Units into the skin 3 (three) times daily with meals. Hold if eats less than 50% of meals Patient taking differently: Inject 10 Units into the skin with breakfast, with lunch, and with  evening meal. Hold if eats less than 50% of meals 01/14/24   Jillian Buttery, MD  insulin  glargine (LANTUS  SOLOSTAR) 100 UNIT/ML Solostar Pen Inject 38 Units into the skin 2 (two) times daily. Patient taking differently: Inject 38-45 Units into the skin 2 (two) times daily. 01/14/24   Jillian Buttery, MD  insulin  lispro (HUMALOG  KWIKPEN) 100 UNIT/ML KwikPen Inject 12 Units into the skin before a meal. May take up to 45 units per day. 05/17/24     ketotifen  (ZADITOR ) 0.035 % ophthalmic solution Place 1 drop into the left eye 2 (two) times daily. 12/03/23   Tobie Yetta HERO, MD  lidocaine  (LIDODERM ) 5 % Place 1 patch to each stump once daily, remove after 12 hours. 04/19/24     loratadine  (CLARITIN ) 10 MG tablet Take 10 mg by mouth daily. 09/27/22   [provider]  midodrine  (PROAMATINE ) 5 MG tablet Take 1 tablet (5 mg total) by mouth 2 (two) times daily with a meal. 04/27/24   Wyn Jackee VEAR Mickey., NP  naloxone  (NARCAN ) nasal spray 4 mg/0.1 mL Place 4 mg into the nose as needed (overdose).    [provider]  naloxone  (NARCAN ) nasal spray 4 mg/0.1 mL Place 1 spray into the nose as needed and seek medical follow-up. 06/13/24     nitroGLYCERIN  (NITROSTAT ) 0.4 MG SL tablet Place 1 tablet (0.4 mg total) under the tongue every 5 (five) minutes as needed. 10/08/22   Wyn Jackee VEAR Mickey., NP  oxyCODONE  (ROXICODONE ) 15 MG immediate release tablet Take 1 tablet (15 mg total) by mouth every 4 (four) hours for 30 days 04/16/24     oxyCODONE  (ROXICODONE ) 15 MG immediate release tablet Take 1 tablet (15 mg total) by mouth every 4 (four) hours. MUST LAST 20 DAYS TILL NEXT APPOINTMENT. 05/31/24     oxyCODONE  (ROXICODONE ) 15 MG immediate release tablet Take 1 tablet (15 mg total) by mouth every 4 (four) hours. 06/13/24     oxyCODONE  (ROXICODONE ) 15 MG immediate release tablet Take 1 tablet (15 mg total) by mouth every 4 (four) hours. 07/09/24     pantoprazole  (PROTONIX ) 40 MG tablet Take 1 tablet (40 mg total) by mouth daily. 03/26/24     pantoprazole  (PROTONIX ) 40 MG tablet Take 1 tablet (40 mg total) by mouth 2 (two) times daily. 07/09/24     prednisoLONE  acetate (PRED FORTE ) 1 % ophthalmic suspension Place 1 drop into the left eye 4 (four) times daily. 02/19/24   [provider]  pregabalin  (LYRICA ) 100 MG capsule Take 1 capsule (100 mg total) by mouth 2 (two) times daily. 04/06/24   Arrien, Mauricio Daniel, MD  promethazine  (PHENERGAN ) 25 MG tablet Take 25 mg by mouth every 4 (four) hours as needed for nausea or vomiting. 09/10/22   [provider]  ranolazine  (RANEXA ) 1000 MG SR tablet Take 1 tablet (1,000 mg total) by mouth 2 (two) times daily. 05/16/24   Wyn Jackee VEAR Mickey., NP  silver  sulfADIAZINE  (SILVADENE ) 1 % cream Apply 1 Application  topically daily until healed. 05/10/24     tiZANidine  (ZANAFLEX ) 4 MG tablet Take 4 mg by mouth 3 (three) times daily. 11/25/23   [provider]  VASCEPA  1 g capsule TAKE TWO CAPSULES (2 G TOTAL) BY MOUTH TWICE DAILY (APPOINTMENT OVERDUE) 05/24/24   Wyn Jackee VEAR Mickey., NP    Allergies: Duloxetine and Sulfa  antibiotics    Review of Systems  Updated Vital Signs BP 110/89 (BP Location: Right Arm)   Pulse 99  Temp 98.1 F (36.7 C) (Oral)   Resp (!) 24   Ht 5' 1 (1.549 m)   Wt (!) 154.2 kg   SpO2 100%   BMI 64.24 kg/m   Physical Exam Vitals and nursing note reviewed.  Constitutional:      General: He is not in acute distress.    Appearance: He is well-developed.  HENT:     Head: Normocephalic and atraumatic.     Mouth/Throat:     Mouth: Mucous membranes are moist.  Eyes:     General: Vision grossly intact. Gaze aligned appropriately.     Extraocular Movements: Extraocular movements intact.     Conjunctiva/sclera: Conjunctivae normal.  Cardiovascular:     Rate and Rhythm: Normal rate and regular rhythm.     Pulses: Normal pulses.     Heart sounds: Normal heart sounds, S1 normal and S2 normal. No murmur heard.    No friction rub. No gallop.  Pulmonary:     Effort: Pulmonary effort is normal. No respiratory distress.     Breath sounds: Normal breath sounds.  Abdominal:     Palpations: Abdomen is soft.     Tenderness: There is no abdominal tenderness. There is no guarding or rebound.     Hernia: No hernia is present.  Musculoskeletal:        General: No swelling.     Cervical back: Full passive range of motion without pain, normal range of motion and neck supple. No pain with movement, spinous process tenderness or muscular tenderness. Normal range of motion.     Right lower leg: No edema.     Left lower leg: No edema.     Comments: Abrasion right knee, normal range of motion, no deformity  Skin:    General: Skin is warm and dry.     Capillary Refill: Capillary  refill takes less than 2 seconds.     Findings: No ecchymosis, erythema, lesion or wound.  Neurological:     Mental Status: He is alert and oriented to person, place, and time.     GCS: GCS eye subscore is 4. GCS verbal subscore is 5. GCS motor subscore is 6.     Cranial Nerves: Cranial nerves 2-12 are intact.     Sensory: Sensation is intact.     Motor: Motor function is intact. No weakness or abnormal muscle tone.     Coordination: Coordination is intact.  Psychiatric:        Mood and Affect: Mood normal.        Speech: Speech normal.        Behavior: Behavior normal.     (all labs ordered are listed, but only abnormal results are displayed) Labs Reviewed  BASIC METABOLIC PANEL WITH GFR - Abnormal; Notable for the following components:      Result Value   Glucose, Bld 180 (*)    BUN 35 (*)    Creatinine, Ser 2.59 (*)    Calcium  7.9 (*)    GFR, Estimated 29 (*)    All other components within normal limits  CBC - Abnormal; Notable for the following components:   WBC 10.7 (*)    All other components within normal limits  TROPONIN I (HIGH SENSITIVITY) - Abnormal; Notable for the following components:   Troponin I (High Sensitivity) 23 (*)    All other components within normal limits  TROPONIN I (HIGH SENSITIVITY) - Abnormal; Notable for the following components:   Troponin I (High Sensitivity) 24 (*)  All other components within normal limits    EKG: EKG Interpretation Date/Time:  Tuesday July 17 2024 21:38:03 EDT Ventricular Rate:  106 PR Interval:  200 QRS Duration:  122 QT Interval:  348 QTC Calculation: 462 R Axis:   -49  Text Interpretation: Sinus tachycardia Left anterior fascicular block Anterolateral infarct , age undetermined Abnormal ECG When compared with ECG of 17-Jul-2024 16:02, No acute changes Confirmed by Haze Lonni PARAS ([id number]) on 07/18/2024 4:53:52 AM  Radiology: CT Lumbar Spine Wo Contrast Result Date: 07/17/2024 CLINICAL DATA:  Low back  pain.  Fall. EXAM: CT LUMBAR SPINE WITHOUT CONTRAST TECHNIQUE: Multidetector CT imaging of the lumbar spine was performed without intravenous contrast administration. Multiplanar CT image reconstructions were also generated. RADIATION DOSE REDUCTION: This exam was performed according to the departmental dose-optimization program which includes automated exposure control, adjustment of the mA and/or kV according to patient size and/or use of iterative reconstruction technique. COMPARISON:  None Available. FINDINGS: Segmentation: 5 lumbar type vertebrae. Alignment: Normal Vertebrae: No acute fracture or focal pathologic process. Paraspinal and other soft tissues: Negative. Disc levels: Degenerative disc disease changes most pronounced at L1-2 and L3-4 with vacuum disc and spurring. Discogenic changes noted in the endplates at L3-4. Moderate bilateral degenerative facet disease, most pronounced at L3-4. No focal disc herniation. IMPRESSION: Degenerative disc and facet disease as above. No acute bony abnormality. Electronically Signed   By: Franky Crease M.D.   On: 07/17/2024 19:04   CT Head Wo Contrast Result Date: 07/17/2024 CLINICAL DATA:  Head trauma, moderate-severe.  Fall. EXAM: CT HEAD WITHOUT CONTRAST TECHNIQUE: Contiguous axial images were obtained from the base of the skull through the vertex without intravenous contrast. RADIATION DOSE REDUCTION: This exam was performed according to the departmental dose-optimization program which includes automated exposure control, adjustment of the mA and/or kV according to patient size and/or use of iterative reconstruction technique. COMPARISON:  None Available. FINDINGS: Brain: No acute intracranial abnormality. Specifically, no hemorrhage, hydrocephalus, mass lesion, acute infarction, or significant intracranial injury. Vascular: No hyperdense vessel or unexpected calcification. Skull: No acute calvarial abnormality. Sinuses/Orbits: No acute findings Other: None  IMPRESSION: No acute intracranial abnormality. Electronically Signed   By: Franky Crease M.D.   On: 07/17/2024 19:02   DG Chest 2 View Result Date: 07/17/2024 CLINICAL DATA:  Chest pain. EXAM: CHEST - 2 VIEW COMPARISON:  March 30, 2024. FINDINGS: The heart size and mediastinal contours are within normal limits. Sternotomy wires are noted. Right lung is clear. Minimal left basilar subsegmental atelectasis or scarring is noted. The visualized skeletal structures are unremarkable. IMPRESSION: Minimal left basilar subsegmental atelectasis or scarring. Electronically Signed   By: Lynwood Landy Raddle M.D.   On: 07/17/2024 16:34     Procedures   Medications Ordered in the ED  lactated ringers  bolus 1,000 mL (has no administration in time range)                                    Medical Decision Making Amount and/or Complexity of Data Reviewed External Data Reviewed: labs, radiology, ECG and notes. Labs: ordered. Decision-making details documented in ED Course. Radiology: ordered and independent interpretation performed. Decision-making details documented in ED Course. ECG/medicine tests: ordered and independent interpretation performed. Decision-making details documented in ED Course.   Differential Diagnosis considered includes, but not limited to: STEMI; NSTEMI; myocarditis; pericarditis; pulmonary embolism; aortic dissection; pneumothorax; pneumonia; gastritis; musculoskeletal pain   Patient  presents to the emergency department with multiple complaints.  He has complaint of right flank and right knee injury after a fall that occurred today.  CT scan does not show any injury.  Knee appears to have an abrasion but no other significant injury.  Patient does have a history of chronic pain syndrome.  Patient is experiencing chest pain.  It seems that the pain started before he fell but may have worsened after.  EKG unchanged from prior and troponins are mildly elevated but at his normal chronic  baseline.  Patient did, however, undergo nuclear medicine stress test in May that was abnormal.  There is a note in his chart from cardiology where his antianginals were increased and he would need a heart cath if he was still symptomatic.  Based on this I would recommend hospitalization for further management.     Final diagnoses:  Fall, initial encounter  Contusion of lower back, initial encounter  Chest pain, unspecified type    ED Discharge Orders     None          Caralyn Twining, Lonni PARAS, MD 07/18/24 [phone]

## 2024-07-18 NOTE — Discharge Instructions (Signed)
 If you start having worsening pain, shortness of breath, vomiting, passing out or retaining more fluid return to the ER.

## 2024-07-18 NOTE — Consult Note (Signed)
 Cardiology Consultation   Patient ID: Joseph [name] MRN: [id number]; DOB: 23-Jun-1971  Admit date: 07/17/2024 Date of Consult: 07/18/2024  PCP:  Alec House, MD   [facility] Providers Cardiologist:  Oneil Parchment, MD     Patient Profile: Joseph [name] is a 53 y.o. male with a hx of CAD s/p CABG (LIMA- LAD, SVG-PDA, SVG-OM1, SVG-D1) '21, PAD s/p bilateral BKAs, COPD, hypertension, hyperlipidemia, ischemic cardiomyopathy, diabetes with diabetic neuropathy, OSA, history of DVT/PE on Xarelto  who is being seen 07/18/2024 for the evaluation of chest pain at the request of Dr. Doretha.  History of Present Illness: [name] is a 53 year old male with past medical history noted above.  History of bare-metal stent to distal circumflex and eventual 4v CABG in 2021.  He underwent 2 stents to the popliteal and left SFA with partial amputation of his foot and below the knee amputation 12/2021. Underwent cardiac cath 05/2022 with patent 4/4 grafts.  He was admitted in 04/2023 with a CHF exacerbation treated with IV Lasix .  He was seen by nephrology for possible nephrotic syndrome.  Creatinine at discharge was 2.54.  Admitted 09/2023 after a fall and again 11/2023 with lower abdominal pain and found to have emphysematous cystitis with a 14-day course of antibiotics and Foley catheter placed.  Prolonged hospitalization in the setting of hyperkalemia that required Lokelma .  Admitted 12/2023 with complaints of chest pain, and elevated white blood cell count of 18.6.  He was started on ceftriaxone  for cellulitis of his left BKA and had to have recurrent Foley placed due to urinary retention.  Creatinine peaked at 3 with improvement to 1.2 prior to discharge.  Admitted 03/2024 with acute on chronic diastolic heart failure and treated with IV Lasix  with home discharge dose of Lasix  80mg  BID.  Also noted to have a UTI.  Echocardiogram during that admission showed LVEF of 55 to 60%, no regional wall motion  abnormality, normal RV, trivial MR, functional bicuspid aortic valve without significant stenosis.  Last seen in the office with [place] echo on 04/27/2024 for hospital follow-up.  He continued to complain of chest pain up to 3-4 times a week which usually occurred with rest and sometimes relieved with nitroglycerin .  He was ordered for an outpatient nuclear stress test, completed 05/09/2024 with findings consistent with ischemia in the mid to basal anterior lateral wall which was felt to be high risk.  Results were discussed with Dr. Parchment with recommendation to increase Ranexa  to 1000 mg twice a day and consider repeat cardiac catheterization if chest pain not improved with increasing antianginals.  Presented to the ED on 7/29 with complaints of chest pain and a fall.  Reports he was at [facility] yesterday attempting to get their taxes for a van.  While sitting there he spearing some left-sided chest discomfort which is similar to what he has been previously experiencing and also felt a little diaphoretic.  When he went to stand up he slipped out of his prosthetic legs and fell onto the floor.   Labs in the ED showed sodium 138, potassium 4.5, creatinine 2.59, high-sensitivity troponin 23>>24, WBC 10.7, hemoglobin 13.4.  Chest x-ray with minimal atelectasis.  EKG showed sinus tachycardia, 111 bpm, previous anterior lateral/inferior infarct, left axis deviation, left anterior fascicular block.  CT head and lumbar spine without acute findings.  Cardiology has been asked to evaluate.  Past Medical History:  Diagnosis Date   Aneurysm of ascending aorta (HCC) 09/14/2021   Anxiety    Aortic  atherosclerosis (HCC) 11/28/2023   Arthritis    knees, shoulders, hips, ankles (07/29/2016)   Asthma    Bicuspid aortic valve 09/14/2021   BMI 70 and over, adult (HCC) 02/16/2019   CAD (coronary artery disease)    a. 2017: s/p BMS to distal Cx; b. LHC 05/30/2019: 80% mid, distal RCA s/p DES, 30% narrowing of d LM, widely  patent LAD w/ luminal irregularities, widely patent stent in dCX  w/ 90+% stenosis distal to stent beofre small trifurcating obtuse marginal (potentially area of restenosis)   Chronic bronchitis (HCC)    Chronic ulcer of great toe of right foot (HCC) 09/09/2017   Chronic ulcer of right great toe (HCC) 09/19/2017   COPD (chronic obstructive pulmonary disease) (HCC)    Coronary arteriosclerosis    Deep venous thrombosis (HCC) 02/16/2019   Depression    Diabetes mellitus (HCC)    DVT (deep venous thrombosis) (HCC)    GERD (gastroesophageal reflux disease)    Hyperlipidemia    Hypertension    Migraine    Morbid obesity (HCC)    Morbid obesity with BMI of 70 and over, adult (HCC) 11/28/2023   Myocardial infarction (HCC) 1996   light one   Non-ST elevation (NSTEMI) myocardial infarction (HCC) 07/22/2020   PE (pulmonary embolism) 04/2013   On chronic Xarelto    Peripheral nerve disease    Pneumonia several times   Sleep apnea    Type II diabetes mellitus (HCC)     Past Surgical History:  Procedure Laterality Date   ABDOMINAL AORTOGRAM W/LOWER EXTREMITY N/A 10/10/2020   Procedure: ABDOMINAL AORTOGRAM W/LOWER EXTREMITY;  Surgeon: Harvey Carlin BRAVO, MD;  [facility]: [facility];  Service: Cardiovascular;  Laterality: N/A;   AMPUTATION Right 09/21/2017   Procedure: RIGHT GREAT TOE AMPUTATION, POSSIBLE VAC;  Surgeon: Jerri Kay HERO, MD;  Location: [facility] OR;  Service: Orthopedics;  Laterality: Right;   AMPUTATION Left 08/13/2020   Procedure: LEFT FOOT 4TH RAY AMPUTATION;  Surgeon: Harden Jerona GAILS, MD;  Location: [facility] OR;  Service: Orthopedics;  Laterality: Left;   AMPUTATION Left 11/20/2021   Procedure: LEFT TRANSMETATARSAL AMPUTATION;  Surgeon: Harden Jerona GAILS, MD;  Location: [facility] OR;  Service: Orthopedics;  Laterality: Left;   AMPUTATION Left 01/08/2022   Procedure: LEFT BELOW KNEE AMPUTATION;  Surgeon: Harden Jerona GAILS, MD;  Location: [facility] OR;  Service: Orthopedics;  Laterality: Left;   AMPUTATION  Right 07/07/2022   Procedure: RIGHT BELOW KNEE AMPUTATION;  Surgeon: Harden Jerona GAILS, MD;  Location: [facility] OR;  Service: Orthopedics;  Laterality: Right;   AORTOGRAM Bilateral 03/13/2021   Procedure: ABDOMINAL AORTOGRAM WITH Left LOWER EXTREMITY RUNOFF;  Surgeon: Magda Debby SAILOR, MD;  Location: [facility] OR;  Service: Vascular;  Laterality: Bilateral;   APPLICATION OF WOUND VAC  01/08/2022   Procedure: APPLICATION OF WOUND VAC;  Surgeon: Harden Jerona GAILS, MD;  Location: [facility] OR;  Service: Orthopedics;;   APPLICATION OF WOUND VAC Right 07/07/2022   Procedure: APPLICATION OF WOUND VAC;  Surgeon: Harden Jerona GAILS, MD;  Location: [facility] OR;  Service: Orthopedics;  Laterality: Right;   CARDIAC CATHETERIZATION  2006   CARDIAC CATHETERIZATION  1996   @ [facility]; when I had my heart attack   CARDIAC CATHETERIZATION N/A 07/29/2016   Procedure: Left Heart Cath and Coronary Angiography;  Surgeon: Dorn JINNY Lesches, MD;  Location: [facility] INVASIVE CV LAB;  Service: Cardiovascular;  Laterality: N/A;   CARDIAC CATHETERIZATION N/A 07/29/2016   Procedure: Coronary Stent Intervention;  Surgeon: Dorn JINNY Lesches, MD;  [facility]: [facility];  Service: Cardiovascular;  Laterality: N/A;   CARPAL TUNNEL RELEASE Bilateral    CORONARY ANGIOPLASTY WITH STENT PLACEMENT  07/29/2016   CORONARY ARTERY BYPASS GRAFT N/A 07/24/2020   Procedure: CORONARY ARTERY BYPASS GRAFTING (CABG), ON PUMP, TIMES FOUR, USING LEFT INTERNAL MAMMARY ARTERY AND ENDOSCOPICALLY HARVESTED RIGHT GREATER SAPHENOUS VEIN;  Surgeon: Shyrl Linnie KIDD, MD;  Location: [facility] OR;  Service: Open Heart Surgery;  Laterality: N/A;  FLOW TAC   CORONARY PRESSURE/FFR STUDY N/A 07/22/2020   Procedure: INTRAVASCULAR PRESSURE WIRE/FFR STUDY;  Surgeon: Anner Alm ORN, MD;  Location: [facility] INVASIVE CV LAB;  Service: Cardiovascular;  Laterality: N/A;   CORONARY STENT INTERVENTION N/A 05/30/2019   Procedure: CORONARY STENT INTERVENTION;  Surgeon: Claudene Victory ORN, MD;  [facility]: [facility];   Service: Cardiovascular;  Laterality: N/A;   CORONARY STENT INTERVENTION N/A 11/02/2019   Procedure: CORONARY STENT INTERVENTION;  Surgeon: Darron Deatrice LABOR, MD;  [facility]: [facility];  Service: Cardiovascular;  Laterality: N/A;   ESOPHAGOGASTRODUODENOSCOPY N/A 09/22/2017   Procedure: ESOPHAGOGASTRODUODENOSCOPY (EGD);  Surgeon: Aneita Gwendlyn DASEN, MD;  Location: [facility] ENDOSCOPY;  Service: Endoscopy;  Laterality: N/A;   KNEE ARTHROSCOPY Bilateral    2 on left; 1 on the right   LEFT HEART CATH AND CORONARY ANGIOGRAPHY N/A 11/02/2019   Procedure: LEFT HEART CATH AND CORONARY ANGIOGRAPHY;  Surgeon: Darron Deatrice LABOR, MD;  [facility];  Service: Cardiovascular;  Laterality: N/A;   LEFT HEART CATH AND CORONARY ANGIOGRAPHY N/A 07/22/2020   Procedure: LEFT HEART CATH AND CORONARY ANGIOGRAPHY;  Surgeon: Anner Alm ORN, MD;  Location: [facility] INVASIVE CV LAB;  Service: Cardiovascular;  Laterality: N/A;   LEFT HEART CATH AND CORS/GRAFTS ANGIOGRAPHY N/A 08/17/2021   Procedure: LEFT HEART CATH AND CORS/GRAFTS ANGIOGRAPHY;  Surgeon: Mady Bruckner, MD;  Location: [facility];  Service: Cardiovascular;  Laterality: N/A;   PERIPHERAL VASCULAR INTERVENTION Left 10/11/2020   popliteal and SFA stent placement    PERIPHERAL VASCULAR INTERVENTION Left 10/10/2020   Procedure: PERIPHERAL VASCULAR INTERVENTION;  Surgeon: Harvey Carlin BRAVO, MD;  [facility]: [facility];  Service: Cardiovascular;  Laterality: Left;   RIGHT/LEFT HEART CATH AND CORONARY ANGIOGRAPHY N/A 05/30/2019   Procedure: RIGHT/LEFT HEART CATH AND CORONARY ANGIOGRAPHY;  Surgeon: Claudene Victory ORN, MD;  [facility]: [facility];  Service: Cardiovascular;  Laterality: N/A;   RIGHT/LEFT HEART CATH AND CORONARY/GRAFT ANGIOGRAPHY N/A 06/17/2022   Procedure: RIGHT/LEFT HEART CATH AND CORONARY/GRAFT ANGIOGRAPHY;  Surgeon: Burnard Debby LABOR, MD;  Location: [facility];  Service: Cardiovascular;  Laterality: N/A;   SHOULDER  OPEN ROTATOR CUFF REPAIR Bilateral    TEE WITHOUT CARDIOVERSION N/A 07/24/2020   Procedure: TRANSESOPHAGEAL ECHOCARDIOGRAM (TEE);  Surgeon: Shyrl Linnie KIDD, MD;  Location: [facility] OR;  Service: Open Heart Surgery;  Laterality: N/A;       Scheduled Meds:  atorvastatin   80 mg Oral Daily   clopidogrel   75 mg Oral Daily   furosemide   80 mg Oral BID   insulin  aspart  12 Units Subcutaneous TID WC   insulin  glargine-yfgn  38 Units Subcutaneous BID   oxyCODONE   15 mg Oral Q4H   pantoprazole   40 mg Oral BID   pregabalin   100 mg Oral BID   ranolazine   1,000 mg Oral BID   tiZANidine   4 mg Oral TID   Continuous Infusions:  lactated ringers  Stopped (07/18/24 0550)   PRN Meds: albuterol   Allergies:    Allergies  Allergen  Reactions   Duloxetine Other (See Comments)    Headaches   Sulfa  Antibiotics Other (See Comments)    Headaches     Social History:   Social History   Socioeconomic History   Marital status: Married    Spouse name: Not on file   Number of children: Not on file   Years of education: Not on file   Highest education level: Not on file  Occupational History   Occupation: disabled     Employer: UNEMPLOYED  Tobacco Use   Smoking status: Former    Current packs/day: 0.00    Average packs/day: 1 pack/day for 34.0 years (34.0 ttl pk-yrs)    Types: Cigarettes    Start date: 07/12/1986    Quit date: 07/12/2020    Years since quitting: 4.0   Smokeless tobacco: Former    Types: Snuff, Chew  Vaping Use   Vaping status: Never Used  Substance and Sexual Activity   Alcohol use: Yes    Comment: RARE   Drug use: No   Sexual activity: Yes  Other Topics Concern   Not on file  Social History Narrative   Not on file   Social Drivers of Health   Financial Resource Strain: Medium Risk (09/09/2022)   Overall Financial Resource Strain (CARDIA)    Difficulty of Paying Living Expenses: Somewhat hard  Food Insecurity: No Food Insecurity (03/28/2024)   Hunger Vital Sign     Worried About Running Out of Food in the Last Year: Never true    Ran Out of Food in the Last Year: Never true  Transportation Needs: Unmet Transportation Needs (03/28/2024)   PRAPARE - Transportation    Lack of Transportation (Medical): Yes    Lack of Transportation (Non-Medical): Yes  Physical Activity: Not on file  Stress: No Stress Concern Present (10/10/2023)   Received from [facility] - Occupational Stress Questionnaire    Feeling of Stress : Not at all  Social Connections: Moderately Isolated (03/28/2024)   Social Connection and Isolation Panel    Frequency of Communication with Friends and Family: More than three times a week    Frequency of Social Gatherings with Friends and Family: More than three times a week    Attends Religious Services: Never    Database administrator or Organizations: No    Attends Banker Meetings: Never    Marital Status: Married  Catering manager Violence: Not At Risk (03/28/2024)   Humiliation, Afraid, Rape, and Kick questionnaire    Fear of Current or Ex-Partner: No    Emotionally Abused: No    Physically Abused: No    Sexually Abused: No    Family History:    Family History  Problem Relation Age of Onset   Hypertension Brother    Hypertension Father    Diabetes Other    Hyperlipidemia Other      ROS:  Please see the history of present illness.   All other ROS reviewed and negative.     Physical Exam/Data: Vitals:   07/18/24 0745 07/18/24 0800 07/18/24 0839 07/18/24 0900  BP:  105/86  101/67  Pulse: 94 89  88  Resp: 15 11  14   Temp:   98 F (36.7 C)   TempSrc:   Axillary   SpO2: 97% 97%  95%  Weight:      Height:       No intake or output data in the 24 hours ending 07/18/24 1007  07/17/2024    3:54 PM 04/27/2024    9:34 AM 04/06/2024    5:08 AM  Last 3 Weights  Weight (lbs) 340 lb 360 lb 380 lb 1.2 oz  Weight (kg) 154.223 kg 163.295 kg 172.4 kg     Body mass index  is 64.24 kg/m.  General:  Well nourished, well developed, in no acute distress HEENT: normal Neck: no JVD Cardiac:  normal S1, S2; RRR; soft systolic murmur  Lungs:  clear to auscultation bilaterally Abd: soft, nontender, no hepatomegaly  Ext/MSK: Bilateral BKAs Skin: warm and dry  Neuro: no focal abnormalities noted Psych:  Normal affect   EKG:  The EKG was personally reviewed and demonstrates:  sinus tachycardia, 111 bpm, previous anterior lateral/inferior infarct, left axis deviation, left anterior fascicular block. Telemetry:  Telemetry was personally reviewed and demonstrates:  Sinus Rhythm  Relevant CV Studies:  Cath: 05/2022    Mid LM to Ost LAD lesion is 80% stenosed with 40% stenosed side branch in Ost Cx to Prox Cx.   Mid Cx-2 lesion is 5% stenosed.   Dist Cx lesion is 70% stenosed.   Mid Cx-1 lesion is 100% stenosed.   Prox RCA to Mid RCA lesion is 30% stenosed.   Dist RCA lesion is 50% stenosed with 50% stenosed side branch in RPDA.   Prox RCA lesion is 80% stenosed.   Prox Graft to Mid Graft lesion is 30% stenosed.   1st Diag lesion is 40% stenosed.   3rd Diag lesion is 60% stenosed.   Mid LAD lesion is 50% stenosed.   SVG and is large.   SVG and is normal in caliber.   SVG and is large.   The graft exhibits no disease.   The graft exhibits no disease.   Severe native CAD with 80% distal left main stenosis with widely patent LIMA graft supplying the LAD and SVG supplying the diagonal vessel.   The native circumflex is totally occluded with a widely patent vein graft supplying the distal OM vessel.   The native dominant RCA has 80% focal proximal stenosis with mild in-stent restenosis in the previously placed mid stent with 50% narrowing at the ostium of the PDA with widely patent SVG to PDA.   Normal right heart pressures with mean PA pressure at 21 mmHg/   RECOMMENDATION: Medical therapy.  Continue long-term DAPT.  Optimal blood pressure control with target  blood pressure less than 130/80.  Aggressive lipid-lowering therapy with target LDL less than 55.   Diagnostic Dominance: Right  Echo: 03/2024  IMPRESSIONS     1. Left ventricular ejection fraction, by estimation, is 55 to 60%. The  left ventricle has normal function. The left ventricle has no regional  wall motion abnormalities. There is mild concentric left ventricular  hypertrophy. Left ventricular diastolic  parameters were normal.   2. Right ventricular systolic function is normal. The right ventricular  size is not well visualized.   3. The mitral valve was not well visualized. Trivial mitral valve  regurgitation. No evidence of mitral stenosis.   4. Valve appears functionally bicuspid, with fused left and right  coronary cusp. The aortic valve is abnormal. Aortic valve regurgitation is  not visualized. Aortic valve sclerosis/calcification is present, without  any evidence of aortic stenosis.   5. Aortic dilatation noted. There is mild dilatation of the ascending  aorta, measuring 41 mm.   Comparison(s): No significant change from prior study.   Conclusion(s)/Recommendation(s): Very limited windows/technically  challening images, even with  use of echo contrast. Grossly preserved LV  function, no severe wall motion abnormalities noted, but reduced  sensitivity for detection of focal wall motion  abnormalities based on images.   FINDINGS   Left Ventricle: Left ventricular ejection fraction, by estimation, is 55  to 60%. The left ventricle has normal function. The left ventricle has no  regional wall motion abnormalities. Definity  contrast agent was given IV  to delineate the left ventricular   endocardial borders. The left ventricular internal cavity size was normal  in size. There is mild concentric left ventricular hypertrophy. Left  ventricular diastolic parameters were normal.   Right Ventricle: The right ventricular size is not well visualized. Right  vetricular  wall thickness was not well visualized. Right ventricular  systolic function is normal.   Left Atrium: Left atrial size was normal in size.   Right Atrium: Right atrial size was not well visualized.   Pericardium: There is no evidence of pericardial effusion.   Mitral Valve: The mitral valve was not well visualized. Trivial mitral  valve regurgitation. No evidence of mitral valve stenosis.   Tricuspid Valve: The tricuspid valve is not well visualized. Tricuspid  valve regurgitation is trivial. No evidence of tricuspid stenosis.   Aortic Valve: Valve appears functionally bicuspid, with fused left and  right coronary cusp. The aortic valve is abnormal. Aortic valve  regurgitation is not visualized. Aortic valve sclerosis/calcification is  present, without any evidence of aortic  stenosis. Aortic valve peak gradient measures 4.3 mmHg.   Pulmonic Valve: The pulmonic valve was not well visualized. Pulmonic valve  regurgitation is not visualized. No evidence of pulmonic stenosis.   Aorta: Aortic dilatation noted. There is mild dilatation of the ascending  aorta, measuring 41 mm.   Venous: The inferior vena cava was not well visualized.   IAS/Shunts: The interatrial septum was not well visualized.    Laboratory Data: High Sensitivity Troponin:   Recent Labs  Lab 07/17/24 1609 07/18/24 0324  TROPONINIHS 23* 24*     Chemistry Recent Labs  Lab 07/17/24 1609  NA 138  K 4.5  CL 104  CO2 24  GLUCOSE 180*  BUN 35*  CREATININE 2.59*  CALCIUM  7.9*  GFRNONAA 29*  ANIONGAP 10    No results for input(s): PROT, ALBUMIN , AST, ALT, ALKPHOS, BILITOT in the last 168 hours. Lipids No results for input(s): CHOL, TRIG, HDL, LABVLDL, LDLCALC, CHOLHDL in the last 168 hours.  Hematology Recent Labs  Lab 07/17/24 1609  WBC 10.7*  RBC 4.85  HGB 13.4  HCT 41.0  MCV 84.5  MCH 27.6  MCHC 32.7  RDW 13.9  PLT 259   Thyroid No results for input(s): TSH,  FREET4 in the last 168 hours.  BNPNo results for input(s): BNP, PROBNP in the last 168 hours.  DDimer No results for input(s): DDIMER in the last 168 hours.  Radiology/Studies:  CT Lumbar Spine Wo Contrast Result Date: 07/17/2024 CLINICAL DATA:  Low back pain.  Fall. EXAM: CT LUMBAR SPINE WITHOUT CONTRAST TECHNIQUE: Multidetector CT imaging of the lumbar spine was performed without intravenous contrast administration. Multiplanar CT image reconstructions were also generated. RADIATION DOSE REDUCTION: This exam was performed according to the departmental dose-optimization program which includes automated exposure control, adjustment of the mA and/or kV according to patient size and/or use of iterative reconstruction technique. COMPARISON:  None Available. FINDINGS: Segmentation: 5 lumbar type vertebrae. Alignment: Normal Vertebrae: No acute fracture or focal pathologic process. Paraspinal and other soft tissues: Negative. Disc levels: Degenerative  disc disease changes most pronounced at L1-2 and L3-4 with vacuum disc and spurring. Discogenic changes noted in the endplates at L3-4. Moderate bilateral degenerative facet disease, most pronounced at L3-4. No focal disc herniation. IMPRESSION: Degenerative disc and facet disease as above. No acute bony abnormality. Electronically Signed   By: Franky Crease M.D.   On: 07/17/2024 19:04   CT Head Wo Contrast Result Date: 07/17/2024 CLINICAL DATA:  Head trauma, moderate-severe.  Fall. EXAM: CT HEAD WITHOUT CONTRAST TECHNIQUE: Contiguous axial images were obtained from the base of the skull through the vertex without intravenous contrast. RADIATION DOSE REDUCTION: This exam was performed according to the departmental dose-optimization program which includes automated exposure control, adjustment of the mA and/or kV according to patient size and/or use of iterative reconstruction technique. COMPARISON:  None Available. FINDINGS: Brain: No acute intracranial  abnormality. Specifically, no hemorrhage, hydrocephalus, mass lesion, acute infarction, or significant intracranial injury. Vascular: No hyperdense vessel or unexpected calcification. Skull: No acute calvarial abnormality. Sinuses/Orbits: No acute findings Other: None IMPRESSION: No acute intracranial abnormality. Electronically Signed   By: Franky Crease M.D.   On: 07/17/2024 19:02   DG Chest 2 View Result Date: 07/17/2024 CLINICAL DATA:  Chest pain. EXAM: CHEST - 2 VIEW COMPARISON:  March 30, 2024. FINDINGS: The heart size and mediastinal contours are within normal limits. Sternotomy wires are noted. Right lung is clear. Minimal left basilar subsegmental atelectasis or scarring is noted. The visualized skeletal structures are unremarkable. IMPRESSION: Minimal left basilar subsegmental atelectasis or scarring. Electronically Signed   By: Lynwood Landy Raddle M.D.   On: 07/17/2024 16:34     Assessment and Plan:  Fong Mccarry Zagal is a 53 y.o. male with a hx of CAD s/p CABG (LIMA- LAD, SVG-PDA, SVG-OM1, SVG-D1) '21, PAD s/p bilateral BKAs, COPD, hypertension, hyperlipidemia, ischemic cardiomyopathy, diabetes with diabetic neuropathy, OSA, history of DVT/PE on previously Xarelto  who is being seen 07/18/2024 for the evaluation of chest pain at the request of Dr. Doretha.  Fall -- Presented after being at the [facility] and slipping out of his prosthetic legs and falling backwards on the floor.  Workup in the ED has been negative.  Chest pain CAD s/p CABG (LIMA- LAD, SVG-PDA, SVG-OM1, SVG-D1)'21 -- Recently underwent outpatient nuclear stress test which was read as high risk for ischemia.  Images were reviewed with his primary cardiologist Dr. Jeffrie and felt to be more low risk.  His Ranexa  was increased to 1000 mg twice daily. -- High-sensitivity troponin 23>>24 -- Does complain of chest pain but is also tender to palpation across the left anterior chest where he reports pain -- EKG without acute ischemic  changes -- Given his elevated renal function with creatinine of 2.59 would be inclined to defer any ischemic evaluation at this time until improvement in renal function given no true ACS noted  Hypertension -- Has had issues with variable blood pressures, previously on midodrine .  States most recently his blood pressures have been elevated  CKD 3b with AKI -- Cr 2.59 on admission, previously had improved to 1.6 from prior hospitalization  -- also says he has not had any urine output since around 9am yesterday. Hx of urinary retention -- pending bladder scan in the ED  PAD s/p bilateral BKA's Diabetes with diabetic neuropathy History of DVT/PE previously on Xarelto  COPD  For questions or updates, please contact [facility] Please consult [email or web address] for contact info under    Signed, Manuelita Rummer, NP  07/18/2024 10:07 AM

## 2024-07-18 NOTE — Treatment Plan (Signed)
 Patient with multiple medical issues (obesity, bilateral BKA's, T2DM, hypertension, HF, COPD, CKD) presenting with C and fall.  Discussed with Dr. Doretha who's requesting hospitalist admission for chest pain.  Labs appear relatively stable (creatinine elevated, but appears close to baseline), hyperglycemia, hypocalcemia, mild leukocytosis.  Troponins low and flat.  Imaging with minimal L basilar subsegmental atelectasis/scarring on CXR.  Head CT and CT L spine without acute findings.    Requested cardiology evaluation first and if they think patient warrants admission for CP will admit to hospitalist service.  Please let us  know if any other concerns.    TRH Meliton Monte, MD

## 2024-07-18 NOTE — ED Provider Notes (Signed)
 Patient checked out by Dr. Polina.  Patient with an atypical chest pain with multiple medical issues including renal disease.  Patient evaluated by cardiology team including Dr. [place] the cardiologist on-call today and at this time they feel that patient's symptoms are unlikely to be cardiac in nature and that he will be able to be discharged home today and follow-up in the office.  They will set up a future outpatient appointment.  He should continue his Ranexa  and other cardiac medications.  He does have diuretics at home.  At this time feel that patient can be discharged home but given return precautions.   Doretha Folks, MD 07/18/24 1249

## 2024-07-19 ENCOUNTER — Other Ambulatory Visit: Payer: Self-pay

## 2024-07-19 ENCOUNTER — Other Ambulatory Visit (HOSPITAL_COMMUNITY): Payer: Self-pay

## 2024-07-20 ENCOUNTER — Other Ambulatory Visit: Payer: Self-pay

## 2024-07-20 ENCOUNTER — Other Ambulatory Visit (HOSPITAL_COMMUNITY): Payer: Self-pay

## 2024-07-24 ENCOUNTER — Ambulatory Visit: Admitting: Physician Assistant

## 2024-07-27 ENCOUNTER — Other Ambulatory Visit (HOSPITAL_COMMUNITY): Payer: Self-pay

## 2024-07-27 MED ORDER — OXYCODONE HCL 15 MG PO TABS
15.0000 mg | ORAL_TABLET | Freq: Every day | ORAL | 0 refills | Status: DC | PRN
  Filled 2024-07-27: qty 150, 32d supply, fill #0
  Filled 2024-08-17: qty 150, 30d supply, fill #0

## 2024-07-27 MED ORDER — FLUTICASONE PROPIONATE 50 MCG/ACT NA SUSP
1.0000 | Freq: Two times a day (BID) | NASAL | 5 refills | Status: AC
  Filled 2024-07-27: qty 16, 30d supply, fill #0
  Filled 2024-09-01: qty 16, 30d supply, fill #1
  Filled 2024-09-29: qty 16, 30d supply, fill #2
  Filled 2024-10-29: qty 16, 30d supply, fill #3
  Filled 2024-11-28: qty 16, 30d supply, fill #4
  Filled 2024-12-27: qty 16, 30d supply, fill #5

## 2024-07-27 MED ORDER — LORATADINE 10 MG PO TABS
10.0000 mg | ORAL_TABLET | Freq: Every day | ORAL | 3 refills | Status: AC
  Filled 2024-07-27: qty 100, 100d supply, fill #0
  Filled 2024-11-06: qty 100, 100d supply, fill #1

## 2024-07-27 MED ORDER — ALPRAZOLAM 2 MG PO TABS
2.0000 mg | ORAL_TABLET | Freq: Two times a day (BID) | ORAL | 0 refills | Status: DC
  Filled 2024-07-27 – 2024-08-17 (×2): qty 60, 30d supply, fill #0

## 2024-07-31 ENCOUNTER — Other Ambulatory Visit: Payer: Self-pay

## 2024-07-31 ENCOUNTER — Other Ambulatory Visit (HOSPITAL_COMMUNITY): Payer: Self-pay

## 2024-08-01 ENCOUNTER — Other Ambulatory Visit: Payer: Self-pay

## 2024-08-01 ENCOUNTER — Other Ambulatory Visit (HOSPITAL_COMMUNITY): Payer: Self-pay

## 2024-08-07 ENCOUNTER — Other Ambulatory Visit (HOSPITAL_COMMUNITY): Payer: Self-pay

## 2024-08-08 ENCOUNTER — Other Ambulatory Visit (HOSPITAL_COMMUNITY): Payer: Self-pay

## 2024-08-17 ENCOUNTER — Other Ambulatory Visit (HOSPITAL_COMMUNITY): Payer: Self-pay

## 2024-08-17 ENCOUNTER — Ambulatory Visit: Admitting: Physical Therapy

## 2024-08-21 ENCOUNTER — Encounter: Payer: Self-pay | Admitting: *Deleted

## 2024-08-22 ENCOUNTER — Ambulatory Visit: Attending: Physician Assistant | Admitting: Physician Assistant

## 2024-08-22 NOTE — Assessment & Plan Note (Deleted)
 CT in 03/2022 w/o evidence of TAA. TTE in 04/2023 suggested ascending aorta 45 mm but last TTE in 03/2024 showed ascending aorta 41 mm. With chronic kidney disease, would hold off on Chest CTA unless TTE shows aorta is larger.

## 2024-08-22 NOTE — Assessment & Plan Note (Deleted)
 He is followed by Nephrology (Dr. Prescilla). ***

## 2024-08-22 NOTE — Assessment & Plan Note (Deleted)
 No aortic insufficiency or aortic stenosis on last Echocardiogram in 03/2024.

## 2024-08-22 NOTE — Progress Notes (Deleted)
 {This patient may be at risk for Amyloid. He has one or more dx on the prob list or PMH from the following list -  Abnormal EKG, HFpEF/Diastolic CHF, Aortic Stenosis, LVH, Bilateral Carpal Tunnel Syndrome, Biceps Tendon Rupture, Spinal Stenosis, Pericardial Effusion, Left Atrial Enlargement, Conduction System Disorder. See list below or review PMH.  Diagnoses From Problem List           Noted     Acute on chronic diastolic CHF (congestive heart failure) (HCC) 07/22/2020     Peripheral nerve disease 02/16/2019    Click HERE to open Cardiac Amyloid Screening SmartSet to order screening OR Click HERE to defer testing for 1 year or permanently :1}     OFFICE NOTE:    Date:  08/22/2024  ID:  Joseph [name], DOB 11-14-1971, MRN [id number] PCP: Alec House, MD  [facility] Providers Cardiologist:  Oneil Parchment, MD { Click to update primary MD,subspecialty MD or APP then REFRESH:1}       Coronary artery disease  S/p PCI to LCx in 07/2016 S/p CABG in 2021 Cath 08/17/2021: L-LAD, S-D3, S-OM2, S-RPDA patent  LHC 06/17/2022: Distal left main 80, mid LAD 50, D1 40, D3 60 LCx 100, proximal RCA 80, mid RCA stent patent with 30 ISR; LIMA-LAD, SVG-Dx, SVG-OM, SVG-PDA patent MPI 05/09/2024: Anterolateral ischemia, EF 37, high risk Chronic chest pain  Peripheral arterial disease  S/p partial foot amputationd S/p stent to L popliteal and L SFA S/p L BKA Jan 2023 S/p R BKA July 2023 (HFpEF) heart failure with preserved ejection fraction  Ascending thoracic aortic aneurysm  TTE 01/07/2022: Bicuspid aortic valve, EF 55-60, no RWMA, moderate LVH, GRII DD, normal RVSF, trivial MR, no aortic stenosis, mild dilation of ascending aorta (43 mm), mild dilation of aortic root (42 mm)   TTE 04/27/2023: EF 55-60, no RWMA, mild LVH, GRII DD, normal RVSF, functionally bicuspid aortic valve, AV sclerosis, aortic root 44 mm, ascending aorta 45 mm TTE 03/28/2024: EF 55-60, no RWMA, mild concentric LVH, normal RVSF,  trivial MR, functionally bicuspid aortic valve, AV sclerosis, ascending aorta 41 mm Aortic atherosclerosis  Hypertension  Hyperlipidemia  Diabetes mellitus Type 2 Chronic kidney disease  Dr. Prescilla Nephrotic range proteinuria  Bicuspid aortic valve Hx of pulmonary embolism  Chronic Obstructive Pulmonary Disease  OSA GERD         Discussed the use of AI scribe software for clinical note transcription with the patient, who gave verbal consent to proceed. History of Present Illness Joseph [name] is a 53 y.o. male who returns for follow up of CAD, CHF.  He was admitted in 03/2024 with acute on chronic CHF.  This was complicated by ATN and cardiorenal syndrome.  He required blood pressure support with Midodrine  and was followed by nephrology.  He was last seen 04/27/2024 by Jackee Alberts, NP.  Nuclear stress test was obtained secondary to symptoms of chest pain.  This was high risk with anterolateral ischemia.  Ranolazine  dose was increased    ROS-See HPI***    Studies Reviewed:      Recent Labs    04/04/24 0815 04/05/24 0306 04/06/24 0252 04/27/24 1051 07/17/24 1609  NA 139 138 142 142 138  K 4.7 4.7 4.9 4.1 4.5  BUN 57* 58* 59* 28* 35*  CREATININE 2.30* 2.34* 2.44* 1.61* 2.59*  GFRNONAA 34* 33* 31*  --  29*    Results  Risk Assessment/Calculations: {Does this patient have ATRIAL FIBRILLATION?:[phone]} No BP recorded.  {Refresh Note  OR Click here to enter BP  :1}***      Physical Exam:  VS:  There were no vitals taken for this visit.       Wt Readings from Last 3 Encounters:  07/17/24 (!) 340 lb (154.2 kg)  04/27/24 (!) 360 lb (163.3 kg)  04/06/24 (!) 380 lb 1.2 oz (172.4 kg)    Physical Exam***     Assessment and Plan:    Assessment & Plan Coronary artery disease involving native coronary artery of native heart with angina pectoris (HCC) History of prior PCI in 2017 and CABG in 2021.  Cardiac catheterization in 2022 and 2023 demonstrated patent bypass  grafts. He underwent nuclear stress test in May 2025 for chest pain and this was high risk with anterolateral ischemia and reduced EF. TTE in 03/2024 showed normal EF. He is at high risk for contrast induced nephropathy due to chronic kidney disease. He is followed by nephrology. Most recent SCr in July was 2.59 (eGFR was 29). Low BP has limited antianginal therapy and he is currently only on Ranolazine  and prn NTG. *** Chronic heart failure with preserved ejection fraction (HCC) Hx of multiple admissions for CHF in the past. He was recently admitted in 03/2024. Hospitalization was complicated by worsening renal function in the setting of cardiorenal syndrome. He is followed by Nephrology. *** Stage 3a chronic kidney disease (HCC) He is followed by Nephrology (Dr. Prescilla). *** Bicuspid aortic valve No aortic insufficiency or aortic stenosis on last Echocardiogram in 03/2024.  Aneurysm of ascending aorta without rupture (HCC) CT in 03/2022 w/o evidence of TAA. TTE in 04/2023 suggested ascending aorta 45 mm but last TTE in 03/2024 showed ascending aorta 41 mm. With chronic kidney disease, would hold off on Chest CTA unless TTE shows aorta is larger.  S/P bilateral BKA (below knee amputation) (HCC)  Assessment and Plan Assessment & Plan    {Preoperative cardiovascular examination The patient needs to undergo eye surgery.  His wife tells me that this is to treat macular degeneration.  There is concerned that he will lose his eyesight.  He has a very complicated cardiac history.  The eye procedure is to be done under conscious sedation.  The patient is high risk for any procedure, regardless of his clinical condition.  I discussed his case today with Dr. Jeffrie (attending MD).  Given the low risk nature of his eye procedure, we agreed that the patient could proceed with his eye procedure at increased but acceptable risk.  From a cardiovascular standpoint, if it is needed, he can hold aspirin  and  clopidogrel  (Plavix ) for 5 days.  This should be resumed postoperatively as soon as it is felt to be safe.   (HFpEF) heart failure with preserved ejection fraction (HCC) He is fairly sedentary.  His diet also seems to be rich in salt.  He drinks electrolyte water which has sodium in it.  I have asked him to reduce/eliminate this.  He did not have any improvement with changing furosemide  to torsemide .  Actually, he feels his volume has worsened.  Therefore, I will discontinue torsemide .  Resume furosemide  120 mg in the morning, 80 mg in the afternoon.  Obtain follow-up BMET in 1 week.  Obtain echocardiogram as previously recommended.  Of note, this does not need to be done prior to his eye surgery.  Follow-up in June as planned with Jackee.   CAD (coronary artery disease) History of prior PCI in 2017 and CABG in 2021.  Cardiac catheterization in  2022 and 2023 demonstrated patent bypass wraps.  He has chronic chest discomfort.  His anginal symptoms have worsened over the past several weeks.  I suspect that this is all related to volume excess.  As noted, I reviewed his case with Dr. Jeffrie.  We do not feel that he needs to undergo repeat cardiac catheterization given his prior results less than 1 year ago.  Given his size, a nuclear stress test will not likely provide adequate results.  He was unable to tolerate Imdur  in the past.  Continue Ranexa  500 mg twice daily, amlodipine  10 mg daily, aspirin  81 mg daily, Lipitor  80 mg daily, carvedilol  12.5 mg twice daily, Plavix  75 mg daily.  Proceed with adjustments in furosemide  as noted.  If his angina does not improve with diuresis, consider increasing dose of Ranexa .  Keep follow-up in June as planned.   Aneurysm of ascending aorta (HCC) 43 mm was on echocardiogram in 2023.  Repeat echocardiogram pending.   Essential hypertension Blood pressure is well-controlled.  Continue amlodipine  10 mg daily, Coreg  12.5 mg twice daily.   Bicuspid aortic valve He has an  aortic stenosis murmur on exam.  He did not have significant aortic stenosis by echocardiogram in 2023.  Therefore, it is not likely that he has significant aortic stenosis at this time.  However, he does need a follow-up echocardiogram to document this.  We will try to reschedule this for him.  He notes transportation issues.  It has been difficult to get here for his echocardiogram.  He also is unable to see Dr. Raford at the [facility] location.  He requests transfer to [facility].  I have reviewed this with Dr. Jeffrie.  We will try to transfer him to [facility] to be followed by Dr. Jeffrie.      :1}    {Are you ordering a CV Procedure (e.g. stress test, cath, DCCV, TEE, etc)?   Press F2        :[id number]}  Dispo:  No follow-ups on file.  Signed, Glendia Ferrier, PA-C

## 2024-08-22 NOTE — Assessment & Plan Note (Deleted)
 History of prior PCI in 2017 and CABG in 2021.  Cardiac catheterization in 2022 and 2023 demonstrated patent bypass grafts. He underwent nuclear stress test in May 2025 for chest pain and this was high risk with anterolateral ischemia and reduced EF. TTE in 03/2024 showed normal EF. He is at high risk for contrast induced nephropathy due to chronic kidney disease. He is followed by nephrology. Most recent SCr in July was 2.59 (eGFR was 29). Low BP has limited antianginal therapy and he is currently only on Ranolazine  and prn NTG. ***

## 2024-08-24 ENCOUNTER — Ambulatory Visit: Admitting: Physical Therapy

## 2024-08-27 ENCOUNTER — Other Ambulatory Visit (HOSPITAL_COMMUNITY): Payer: Self-pay

## 2024-09-06 ENCOUNTER — Other Ambulatory Visit (HOSPITAL_COMMUNITY): Payer: Self-pay

## 2024-09-06 ENCOUNTER — Other Ambulatory Visit: Payer: Self-pay

## 2024-09-06 ENCOUNTER — Ambulatory Visit: Attending: Family Medicine

## 2024-09-06 DIAGNOSIS — R2689 Other abnormalities of gait and mobility: Secondary | ICD-10-CM | POA: Diagnosis present

## 2024-09-06 DIAGNOSIS — M6281 Muscle weakness (generalized): Secondary | ICD-10-CM | POA: Insufficient documentation

## 2024-09-06 DIAGNOSIS — R2681 Unsteadiness on feet: Secondary | ICD-10-CM | POA: Insufficient documentation

## 2024-09-06 MED ORDER — LOSARTAN POTASSIUM-HCTZ 100-25 MG PO TABS
1.0000 | ORAL_TABLET | Freq: Every day | ORAL | 0 refills | Status: DC
  Filled 2024-09-06 – 2024-09-10 (×2): qty 90, 90d supply, fill #0

## 2024-09-06 MED ORDER — ALPRAZOLAM 2 MG PO TABS
2.0000 mg | ORAL_TABLET | Freq: Two times a day (BID) | ORAL | 0 refills | Status: DC
  Filled 2024-09-14: qty 60, 30d supply, fill #0

## 2024-09-06 MED ORDER — OXYCODONE HCL 15 MG PO TABS
15.0000 mg | ORAL_TABLET | Freq: Every day | ORAL | 0 refills | Status: DC | PRN
  Filled 2024-09-06 – 2024-09-14 (×2): qty 150, 30d supply, fill #0

## 2024-09-06 NOTE — Therapy (Unsigned)
 OUTPATIENT PHYSICAL THERAPY NEURO EVALUATION   Patient Name: Joseph [name] MRN: [id number] DOB:[birth year], 53 y.o., male Today's Date: 09/07/2024   PCP: Geni Free, MD REFERRING PROVIDER: Geni Free, MD  END OF SESSION:  PT End of Session - 09/06/24 1531     Visit Number 1    Number of Visits 1    Authorization Type UHC Dual + Medicaid    PT Start Time 1530    PT Stop Time 1615    PT Time Calculation (min) 45 min    Activity Tolerance Treatment limited secondary to medical complications (Comment)   wounds   Behavior During Therapy [facility] for tasks assessed/performed          Past Medical History:  Diagnosis Date   Aneurysm of ascending aorta (HCC) 09/14/2021   Anxiety    Aortic atherosclerosis (HCC) 11/28/2023   Arthritis    knees, shoulders, hips, ankles (07/29/2016)   Asthma    Bicuspid aortic valve 09/14/2021   BMI 70 and over, adult (HCC) 02/16/2019   CAD (coronary artery disease)    a. 2017: s/p BMS to distal Cx; b. LHC 05/30/2019: 80% mid, distal RCA s/p DES, 30% narrowing of d LM, widely patent LAD w/ luminal irregularities, widely patent stent in dCX  w/ 90+% stenosis distal to stent beofre small trifurcating obtuse marginal (potentially area of restenosis)   Chronic bronchitis (HCC)    Chronic ulcer of great toe of right foot (HCC) 09/09/2017   Chronic ulcer of right great toe (HCC) 09/19/2017   COPD (chronic obstructive pulmonary disease) (HCC)    Coronary arteriosclerosis    Deep venous thrombosis (HCC) 02/16/2019   Depression    Diabetes mellitus (HCC)    DVT (deep venous thrombosis) (HCC)    GERD (gastroesophageal reflux disease)    Hyperlipidemia    Hypertension    Migraine    Morbid obesity (HCC)    Morbid obesity with BMI of 70 and over, adult (HCC) 11/28/2023   Myocardial infarction (HCC) 1996   light one   Non-ST elevation (NSTEMI) myocardial infarction (HCC) 07/22/2020   PE (pulmonary embolism) 04/2013   On chronic Xarelto    Peripheral  nerve disease    Pneumonia several times   Sleep apnea    Type II diabetes mellitus (HCC)    Past Surgical History:  Procedure Laterality Date   ABDOMINAL AORTOGRAM W/LOWER EXTREMITY N/A 10/10/2020   Procedure: ABDOMINAL AORTOGRAM W/LOWER EXTREMITY;  Surgeon: Harvey Carlin BRAVO, MD;  [facility]: [facility];  Service: Cardiovascular;  Laterality: N/A;   AMPUTATION Right 09/21/2017   Procedure: RIGHT GREAT TOE AMPUTATION, POSSIBLE VAC;  Surgeon: Jerri Kay HERO, MD;  Location: [facility] OR;  Service: Orthopedics;  Laterality: Right;   AMPUTATION Left 08/13/2020   Procedure: LEFT FOOT 4TH RAY AMPUTATION;  Surgeon: Harden Jerona GAILS, MD;  Location: [facility] OR;  Service: Orthopedics;  Laterality: Left;   AMPUTATION Left 11/20/2021   Procedure: LEFT TRANSMETATARSAL AMPUTATION;  Surgeon: Harden Jerona GAILS, MD;  Location: [facility] OR;  Service: Orthopedics;  Laterality: Left;   AMPUTATION Left 01/08/2022   Procedure: LEFT BELOW KNEE AMPUTATION;  Surgeon: Harden Jerona GAILS, MD;  Location: [facility] OR;  Service: Orthopedics;  Laterality: Left;   AMPUTATION Right 07/07/2022   Procedure: RIGHT BELOW KNEE AMPUTATION;  Surgeon: Harden Jerona GAILS, MD;  Location: [facility] OR;  Service: Orthopedics;  Laterality: Right;   AORTOGRAM Bilateral 03/13/2021   Procedure: ABDOMINAL AORTOGRAM WITH Left LOWER EXTREMITY RUNOFF;  Surgeon: Magda Debby SAILOR, MD;  Location: [facility] OR;  Service: Vascular;  Laterality: Bilateral;   APPLICATION OF WOUND VAC  01/08/2022   Procedure: APPLICATION OF WOUND VAC;  Surgeon: Harden Jerona GAILS, MD;  Location: [facility] OR;  Service: Orthopedics;;   APPLICATION OF WOUND VAC Right 07/07/2022   Procedure: APPLICATION OF WOUND VAC;  Surgeon: Harden Jerona GAILS, MD;  Location: [facility] OR;  Service: Orthopedics;  Laterality: Right;   CARDIAC CATHETERIZATION  2006   CARDIAC CATHETERIZATION  1996   @ [facility]; when I had my heart attack   CARDIAC CATHETERIZATION N/A 07/29/2016   Procedure: Left Heart Cath and Coronary Angiography;  Surgeon: Dorn JINNY Lesches,  MD;  Location: [facility] INVASIVE CV LAB;  Service: Cardiovascular;  Laterality: N/A;   CARDIAC CATHETERIZATION N/A 07/29/2016   Procedure: Coronary Stent Intervention;  Surgeon: Dorn JINNY Lesches, MD;  [facility]: [facility];  Service: Cardiovascular;  Laterality: N/A;   CARPAL TUNNEL RELEASE Bilateral    CORONARY ANGIOPLASTY WITH STENT PLACEMENT  07/29/2016   CORONARY ARTERY BYPASS GRAFT N/A 07/24/2020   Procedure: CORONARY ARTERY BYPASS GRAFTING (CABG), ON PUMP, TIMES FOUR, USING LEFT INTERNAL MAMMARY ARTERY AND ENDOSCOPICALLY HARVESTED RIGHT GREATER SAPHENOUS VEIN;  Surgeon: Shyrl Linnie KIDD, MD;  Location: [facility] OR;  Service: Open Heart Surgery;  Laterality: N/A;  FLOW TAC   CORONARY PRESSURE/FFR STUDY N/A 07/22/2020   Procedure: INTRAVASCULAR PRESSURE WIRE/FFR STUDY;  Surgeon: Anner Alm ORN, MD;  Location: [facility] INVASIVE CV LAB;  Service: Cardiovascular;  Laterality: N/A;   CORONARY STENT INTERVENTION N/A 05/30/2019   Procedure: CORONARY STENT INTERVENTION;  Surgeon: Claudene Victory ORN, MD;  [facility]: [facility];  Service: Cardiovascular;  Laterality: N/A;   CORONARY STENT INTERVENTION N/A 11/02/2019   Procedure: CORONARY STENT INTERVENTION;  Surgeon: Darron Deatrice LABOR, MD;  [facility];  Service: Cardiovascular;  Laterality: N/A;   ESOPHAGOGASTRODUODENOSCOPY N/A 09/22/2017   Procedure: ESOPHAGOGASTRODUODENOSCOPY (EGD);  Surgeon: Aneita Gwendlyn DASEN, MD;  Location: [facility] ENDOSCOPY;  Service: Endoscopy;  Laterality: N/A;   KNEE ARTHROSCOPY Bilateral    2 on left; 1 on the right   LEFT HEART CATH AND CORONARY ANGIOGRAPHY N/A 11/02/2019   Procedure: LEFT HEART CATH AND CORONARY ANGIOGRAPHY;  Surgeon: Darron Deatrice LABOR, MD;  [facility];  Service: Cardiovascular;  Laterality: N/A;   LEFT HEART CATH AND CORONARY ANGIOGRAPHY N/A 07/22/2020   Procedure: LEFT HEART CATH AND CORONARY ANGIOGRAPHY;  Surgeon: Anner Alm ORN, MD;  Location: [facility] INVASIVE CV LAB;  Service:  Cardiovascular;  Laterality: N/A;   LEFT HEART CATH AND CORS/GRAFTS ANGIOGRAPHY N/A 08/17/2021   Procedure: LEFT HEART CATH AND CORS/GRAFTS ANGIOGRAPHY;  Surgeon: Mady Bruckner, MD;  [facility]: [facility];  Service: Cardiovascular;  Laterality: N/A;   PERIPHERAL VASCULAR INTERVENTION Left 10/11/2020   popliteal and SFA stent placement    PERIPHERAL VASCULAR INTERVENTION Left 10/10/2020   Procedure: PERIPHERAL VASCULAR INTERVENTION;  Surgeon: Harvey Carlin BRAVO, MD;  [facility]: [facility];  Service: Cardiovascular;  Laterality: Left;   RIGHT/LEFT HEART CATH AND CORONARY ANGIOGRAPHY N/A 05/30/2019   Procedure: RIGHT/LEFT HEART CATH AND CORONARY ANGIOGRAPHY;  Surgeon: Claudene Victory ORN, MD;  [facility]: [facility];  Service: Cardiovascular;  Laterality: N/A;   RIGHT/LEFT HEART CATH AND CORONARY/GRAFT ANGIOGRAPHY N/A 06/17/2022   Procedure: RIGHT/LEFT HEART CATH AND CORONARY/GRAFT ANGIOGRAPHY;  Surgeon: Burnard Debby LABOR, MD;  Location: [facility];  Service: Cardiovascular;  Laterality: N/A;   SHOULDER OPEN ROTATOR CUFF REPAIR Bilateral  TEE WITHOUT CARDIOVERSION N/A 07/24/2020   Procedure: TRANSESOPHAGEAL ECHOCARDIOGRAM (TEE);  Surgeon: Shyrl Linnie KIDD, MD;  Location: [facility] OR;  Service: Open Heart Surgery;  Laterality: N/A;   Patient Active Problem List   Diagnosis Date Noted   Pressure injury 04/05/2024   UTI (urinary tract infection) 04/04/2024   Peripheral edema 03/31/2024   Acute kidney failure (HCC) 01/09/2024   Acute urinary retention 01/08/2024   S/P bilateral BKA (below knee amputation) (HCC) 01/07/2024   Morbid obesity with BMI of 70 and over, adult (HCC) 11/28/2023   Aortic atherosclerosis (HCC) 11/28/2023   CKD (chronic kidney disease), stage III (HCC) 11/28/2023   Weakness 04/28/2023   Nephrosis 04/27/2023   AKI (acute kidney injury) (HCC) 04/26/2023   OSA (obstructive sleep apnea) 04/26/2023   Hx of iron deficiency anemia 07/07/2022   Below-knee  amputation of left lower extremity (HCC)    Bipolar disorder with depression (HCC)    Dyspnea 11/18/2021   Bicuspid aortic valve 09/14/2021   Aneurysm of ascending aorta (HCC) 09/14/2021   DM (diabetes mellitus) with peripheral vascular complication (HCC) 08/20/2021   Essential hypertension 07/17/2021   Cellulitis of left lower leg 04/11/2021   Acute on chronic diastolic CHF (congestive heart failure) (HCC) 07/22/2020   Acute kidney injury superimposed on stage 3b chronic kidney disease (HCC) 07/18/2020   Leukocytosis 05/31/2019   Smoker 03/08/2019   Chronic back pain 02/16/2019   BMI 70 and over, adult (HCC) 02/16/2019   Peripheral nerve disease 02/16/2019   COPD (chronic obstructive pulmonary disease) (HCC) 01/22/2019   Anxiety and depression 03/26/2018   Recurrent chest pain 07/29/2016   Hyperglycemia    Insulin  dependent type 2 diabetes mellitus (HCC) 04/29/2015   History of pulmonary embolus (PE) 04/27/2013   CAD (coronary artery disease) 09/10/2008    ONSET DATE: 08/06/24  REFERRING DIAG:  Diagnosis  R26.9 (ICD-10-CM) - Unspecified abnormalities of gait and mobility    THERAPY DIAG:  Muscle weakness (generalized) - Plan: PT plan of care cert/re-cert  Other abnormalities of gait and mobility - Plan: PT plan of care cert/re-cert  Unsteadiness on feet - Plan: PT plan of care cert/re-cert  Rationale for Evaluation and Treatment: Rehabilitation  SUBJECTIVE:                                                                                                                                                                                             SUBJECTIVE STATEMENT: Patient arrives to clinic in [facility] carrying B prosthetics. He got new prosthetics that fit better now. He has a 2-quarter sized wound on his L stump. He also has at least 2  wounds on his R stump that are scabbed over. He is currently using his prosthetics despite these wounds. Is putting Silvadene  on it. Per patient,  wife state's it's getting smaller. Nurse visited patients home today and is aware of the wounds, but did not comment on whether they are safe to ambulate on.  Pt accompanied by: self  PERTINENT HISTORY: anxiety, aortic atherosclerosis, asthma, CAD, MI, PE, pneumonia, OSA, DMII, B BKA  PAIN:  Are you having pain? No  PRECAUTIONS: Fall   WEIGHT BEARING RESTRICTIONS: No  FALLS: Has patient fallen in last 6 months? Yes. Number of falls 2; sweaty at the [facility] and his R leg came off, fell off the bed  LIVING ENVIRONMENT: Lives with: lives with their family Lives in: House/apartment Stairs: Yes: Internal: flight steps; on right going up Has following equipment at home: Counselling psychologist, Environmental consultant - 4 wheeled, and Wheelchair (power)  PLOF: Independent  PATIENT GOALS: to walk with the new prosthetics   OBJECTIVE:  Note: Objective measures were completed at Evaluation unless otherwise noted.   COGNITION: Overall cognitive status: Within functional limits for tasks assessed   SENSATION: Insensate B knees and distal   POSTURE: rounded shoulders and forward head   LOWER EXTREMITY MMT:    MMT Right Eval Left Eval  Hip flexion 5 5  Hip extension    Hip abduction 5 5  Hip adduction 5 5  Hip internal rotation    Hip external rotation    Knee flexion 5 5  Knee extension 5 5  Ankle dorsiflexion    Ankle plantarflexion    Ankle inversion    Ankle eversion    (Blank rows = not tested)                                                                                                                              TREATMENT Theract: -L stump wound: 3.5cm x 3 cm at widest portions   -macerated wound edges, granulation tissue present, no slough or necrotic tissue observed  -education on importance of preserving integrity of residual limbs, not standing on distal end of residual limbs  PATIENT EDUCATION: Education details: PT POC, f/u with Harden or PCP re: wounds  Person educated:  Patient Education method: Explanation Education comprehension: verbalized understanding  GOALS: Not indicated as patient does not require skilled PT services at this time.   ASSESSMENT:  CLINICAL IMPRESSION: Patient is a 53 y.o. male who was seen today for physical therapy evaluation and treatment for gait training with new prosthetics. He is a bilateral below- knee amputee who is familiar to this clinic. He has been ambulating short distances at home with no AD, but reaching for furniture. He endorses wearing his prosthetics for ~12mins every other day. Otherwise, he is walking on his knees. He also reports standing on the distal end of his residual limbs in order to use the toilet- he does this without prosthetics on. He had  a recent fall at the [facility] where his R prosthetic slid off due to sweat and he landed on his R residual limb. This resulted in 2-3 small abrasions. He currently has a sizeable wound on his L residual limb, measuring 3.5cm x3cm at its widest points. The wound edges are macerated, but there is granulation tissue present in the wound bed- no slough or necrotic tissue observable. Patient states this has been present for ~3 weeks. This wound is in a precarious location and has showed little to no evidence of healing thus far. Per patient, PCP and surgeon are not aware of this wound. He is truly not safe to ambulate with this wound at this time, especially given his extensive history of non- healing or slow-healing wounds. For this reason, patient cannot safely participate in PT at this time until this wound is addressed.    CLINICAL DECISION MAKING: Stable/uncomplicated  EVALUATION COMPLEXITY: Low  PLAN:  PT FREQUENCY: one time visit  PT DURATION: other: 1x visit   Delon DELENA Pop, PT Delon DELENA Pop, PT, DPT, CBIS  09/07/2024, 7:44 AM

## 2024-09-07 ENCOUNTER — Telehealth: Payer: Self-pay

## 2024-09-07 NOTE — Telephone Encounter (Signed)
 I called pt and lm on vm to advise would like to make appt for Monday.

## 2024-09-07 NOTE — Telephone Encounter (Signed)
 Dr. Harden,   Abdiaziz Gilliam was seen yesterday (9/18) for a PT evaluation for gait training s/p B BKA. He currently has a 3.5cm x 3cm wound on the distal end of his L residual limb. He states it has been there for ~3 weeks. He showed me a picture of it from the day it occurred and it does not look like it has truly changed in size. There is no slough or necrotic tissue present- there is granulation tissue present, but the wound edges are rather macerated. He mentioned that he had not made you aware of this wound. I advised him to hold off on wearing his L prosthetic until this wound is addressed. He noted that he would reach out to you regarding this wound, but I wanted to make sure you were aware.   Thank you,  Delon DELENA Pop, PT, DPT, CBIS

## 2024-09-08 NOTE — Telephone Encounter (Signed)
 Pt has an appt sch with Erin on 09/12/2024

## 2024-09-10 ENCOUNTER — Other Ambulatory Visit (HOSPITAL_COMMUNITY): Payer: Self-pay

## 2024-09-11 ENCOUNTER — Other Ambulatory Visit (HOSPITAL_COMMUNITY): Payer: Self-pay

## 2024-09-12 ENCOUNTER — Ambulatory Visit (INDEPENDENT_AMBULATORY_CARE_PROVIDER_SITE_OTHER): Admitting: Family

## 2024-09-12 DIAGNOSIS — L97221 Non-pressure chronic ulcer of left calf limited to breakdown of skin: Secondary | ICD-10-CM

## 2024-09-13 ENCOUNTER — Other Ambulatory Visit (HOSPITAL_COMMUNITY): Payer: Self-pay

## 2024-09-14 ENCOUNTER — Other Ambulatory Visit (HOSPITAL_COMMUNITY): Payer: Self-pay

## 2024-09-18 ENCOUNTER — Encounter: Payer: Self-pay | Admitting: Family

## 2024-09-18 NOTE — Progress Notes (Signed)
 Office Visit Note   Patient: Joseph [name]           Date of Birth: 1971/06/11           MRN: [id number] Visit Date: 09/12/2024              Requested by: Alec House, MD [address] [place],  [name] [address] PCP: Alec House, MD  Chief Complaint  Patient presents with   Left Leg - Wound Check      HPI: The patient is a 53 year old gentleman who is seen as for evaluation of new ulcers to his left residual limb he reports that his liner slid when he fell forward the area of the pin cause a shear injury to his distal residual limb.  This was about 2 weeks ago he reports he has had difficulty with wound healing since has not been applying any ointments or dressings denies fevers or chills  Assessment & Plan: Visit Diagnoses:  1. Non-pressure chronic ulcer of left calf, limited to breakdown of skin (HCC)     Plan: Recommended Vashe to dry dressing changes this was applied today.  Continue shrinker.  Discussed following with [facility] for possible modifications to sockets for a better fit  Follow-Up Instructions: Return in about 2 weeks (around 09/26/2024).   Ortho Exam  Patient is alert, oriented, no adenopathy, well-dressed, normal affect, normal respiratory effort.  On examination left residual limb this has distal ulcer over the tibia this is about 1.5 centimeters by 1 cm 2 mm deep filled in with full granulation there is mild surrounding maceration no erythema or signs of infection    Imaging: No results found. No images are attached to the encounter.  Labs: Lab Results  Component Value Date   HGBA1C 8.1 (H) 01/07/2024   HGBA1C 8.0 (H) 11/27/2023   HGBA1C 7.4 (H) 04/26/2023   ESRSEDRATE 62 (H) 07/01/2022   ESRSEDRATE 45 (H) 11/18/2021   ESRSEDRATE 41 (H) 09/16/2017   CRP 12.0 (H) 07/01/2022   CRP 12.1 (H) 11/18/2021   CRP 8.9 (H) 09/16/2017   REPTSTATUS 04/02/2024 FINAL 03/31/2024   CULT >=100,000 COLONIES/mL PROTEUS MIRABILIS (A)  03/31/2024   LABORGA PROTEUS MIRABILIS (A) 03/31/2024     Lab Results  Component Value Date   ALBUMIN  2.2 (L) 04/06/2024   ALBUMIN  2.4 (L) 04/05/2024   ALBUMIN  2.2 (L) 04/04/2024   PREALBUMIN 16.5 (L) 01/07/2022    Lab Results  Component Value Date   MG 2.0 04/01/2024   MG 1.5 (L) 03/31/2024   MG 1.3 (L) 03/28/2024   Lab Results  Component Value Date   VD25OH 9.40 (L) 01/07/2022    Lab Results  Component Value Date   PREALBUMIN 16.5 (L) 01/07/2022      Latest Ref Rng & Units 07/17/2024    4:09 PM 04/27/2024   10:51 AM 04/04/2024    8:15 AM  CBC EXTENDED  WBC 4.0 - 10.5 K/uL 10.7  7.4  7.5   RBC 4.22 - 5.81 MIL/uL 4.85  4.66  4.72   Hemoglobin 13.0 - 17.0 g/dL 86.5  87.2  87.1   HCT 39.0 - 52.0 % 41.0  39.0  38.8   Platelets 150 - 400 K/uL 259  242  253      There is no height or weight on file to calculate BMI.  Orders:  No orders of the defined types were placed in this encounter.  No orders of the defined types were placed in  this encounter.    Procedures: No procedures performed  Clinical Data: No additional findings.  ROS:  All other systems negative, except as noted in the HPI. Review of Systems  Objective: Vital Signs: There were no vitals taken for this visit.  Specialty Comments:  No specialty comments available.  PMFS History: Patient Active Problem List   Diagnosis Date Noted   Pressure injury 04/05/2024   UTI (urinary tract infection) 04/04/2024   Peripheral edema 03/31/2024   Acute kidney failure 01/09/2024   Acute urinary retention 01/08/2024   S/P bilateral BKA (below knee amputation) (HCC) 01/07/2024   Morbid obesity with BMI of 70 and over, adult (HCC) 11/28/2023   Aortic atherosclerosis 11/28/2023   CKD (chronic kidney disease), stage III (HCC) 11/28/2023   Weakness 04/28/2023   Nephrosis 04/27/2023   AKI (acute kidney injury) 04/26/2023   OSA (obstructive sleep apnea) 04/26/2023   Hx of iron deficiency anemia 07/07/2022    Below-knee amputation of left lower extremity (HCC)    Bipolar disorder with depression (HCC)    Dyspnea 11/18/2021   Bicuspid aortic valve 09/14/2021   Aneurysm of ascending aorta 09/14/2021   DM (diabetes mellitus) with peripheral vascular complication (HCC) 08/20/2021   Essential hypertension 07/17/2021   Cellulitis of left lower leg 04/11/2021   Acute on chronic diastolic CHF (congestive heart failure) (HCC) 07/22/2020   Acute kidney injury superimposed on stage 3b chronic kidney disease (HCC) 07/18/2020   Leukocytosis 05/31/2019   Smoker 03/08/2019   Chronic back pain 02/16/2019   BMI 70 and over, adult (HCC) 02/16/2019   Peripheral nerve disease 02/16/2019   COPD (chronic obstructive pulmonary disease) (HCC) 01/22/2019   Anxiety and depression 03/26/2018   Recurrent chest pain 07/29/2016   Hyperglycemia    Insulin  dependent type 2 diabetes mellitus (HCC) 04/29/2015   History of pulmonary embolus (PE) 04/27/2013   CAD (coronary artery disease) 09/10/2008   Past Medical History:  Diagnosis Date   Aneurysm of ascending aorta 09/14/2021   Anxiety    Aortic atherosclerosis 11/28/2023   Arthritis    knees, shoulders, hips, ankles (07/29/2016)   Asthma    Bicuspid aortic valve 09/14/2021   BMI 70 and over, adult (HCC) 02/16/2019   CAD (coronary artery disease)    a. 2017: s/p BMS to distal Cx; b. LHC 05/30/2019: 80% mid, distal RCA s/p DES, 30% narrowing of d LM, widely patent LAD w/ luminal irregularities, widely patent stent in dCX  w/ 90+% stenosis distal to stent beofre small trifurcating obtuse marginal (potentially area of restenosis)   Chronic bronchitis (HCC)    Chronic ulcer of great toe of right foot (HCC) 09/09/2017   Chronic ulcer of right great toe (HCC) 09/19/2017   COPD (chronic obstructive pulmonary disease) (HCC)    Coronary arteriosclerosis    Deep venous thrombosis (HCC) 02/16/2019   Depression    Diabetes mellitus (HCC)    DVT (deep venous thrombosis)  (HCC)    GERD (gastroesophageal reflux disease)    Hyperlipidemia    Hypertension    Migraine    Morbid obesity (HCC)    Morbid obesity with BMI of 70 and over, adult (HCC) 11/28/2023   Myocardial infarction (HCC) 1996   light one   Non-ST elevation (NSTEMI) myocardial infarction (HCC) 07/22/2020   PE (pulmonary embolism) 04/2013   On chronic Xarelto    Peripheral nerve disease    Pneumonia several times   Sleep apnea    Type II diabetes mellitus (HCC)     Family  History  Problem Relation Age of Onset   Hypertension Brother    Hypertension Father    Diabetes Other    Hyperlipidemia Other     Past Surgical History:  Procedure Laterality Date   ABDOMINAL AORTOGRAM W/LOWER EXTREMITY N/A 10/10/2020   Procedure: ABDOMINAL AORTOGRAM W/LOWER EXTREMITY;  Surgeon: Harvey Carlin BRAVO, MD;  Location: [facility];  Service: Cardiovascular;  Laterality: N/A;   AMPUTATION Right 09/21/2017   Procedure: RIGHT GREAT TOE AMPUTATION, POSSIBLE VAC;  Surgeon: Jerri Kay HERO, MD;  Location: [facility] OR;  Service: Orthopedics;  Laterality: Right;   AMPUTATION Left 08/13/2020   Procedure: LEFT FOOT 4TH RAY AMPUTATION;  Surgeon: Harden Jerona GAILS, MD;  Location: [facility];  Service: Orthopedics;  Laterality: Left;   AMPUTATION Left 11/20/2021   Procedure: LEFT TRANSMETATARSAL AMPUTATION;  Surgeon: Harden Jerona GAILS, MD;  Location: [facility] OR;  Service: Orthopedics;  Laterality: Left;   AMPUTATION Left 01/08/2022   Procedure: LEFT BELOW KNEE AMPUTATION;  Surgeon: Harden Jerona GAILS, MD;  Location: [facility] OR;  Service: Orthopedics;  Laterality: Left;   AMPUTATION Right 07/07/2022   Procedure: RIGHT BELOW KNEE AMPUTATION;  Surgeon: Harden Jerona GAILS, MD;  Location: [facility] OR;  Service: Orthopedics;  Laterality: Right;   AORTOGRAM Bilateral 03/13/2021   Procedure: ABDOMINAL AORTOGRAM WITH Left LOWER EXTREMITY RUNOFF;  Surgeon: Magda Debby SAILOR, MD;  Location: [facility] OR;  Service: Vascular;  Laterality: Bilateral;   APPLICATION OF WOUND VAC   01/08/2022   Procedure: APPLICATION OF WOUND VAC;  Surgeon: Harden Jerona GAILS, MD;  Location: [facility] OR;  Service: Orthopedics;;   APPLICATION OF WOUND VAC Right 07/07/2022   Procedure: APPLICATION OF WOUND VAC;  Surgeon: Harden Jerona GAILS, MD;  Location: [facility] OR;  Service: Orthopedics;  Laterality: Right;   CARDIAC CATHETERIZATION  2006   CARDIAC CATHETERIZATION  1996   @ [facility]; when I had my heart attack   CARDIAC CATHETERIZATION N/A 07/29/2016   Procedure: Left Heart Cath and Coronary Angiography;  Surgeon: Dorn JINNY Lesches, MD;  Location: [facility] INVASIVE CV LAB;  Service: Cardiovascular;  Laterality: N/A;   CARDIAC CATHETERIZATION N/A 07/29/2016   Procedure: Coronary Stent Intervention;  Surgeon: Dorn JINNY Lesches, MD;  Location: [facility];  Service: Cardiovascular;  Laterality: N/A;   CARPAL TUNNEL RELEASE Bilateral    CORONARY ANGIOPLASTY WITH STENT PLACEMENT  07/29/2016   CORONARY ARTERY BYPASS GRAFT N/A 07/24/2020   Procedure: CORONARY ARTERY BYPASS GRAFTING (CABG), ON PUMP, TIMES FOUR, USING LEFT INTERNAL MAMMARY ARTERY AND ENDOSCOPICALLY HARVESTED RIGHT GREATER SAPHENOUS VEIN;  Surgeon: Shyrl Linnie KIDD, MD;  Location: [facility] OR;  Service: Open Heart Surgery;  Laterality: N/A;  FLOW TAC   CORONARY PRESSURE/FFR STUDY N/A 07/22/2020   Procedure: INTRAVASCULAR PRESSURE WIRE/FFR STUDY;  Surgeon: Anner Alm ORN, MD;  Location: [facility] INVASIVE CV LAB;  Service: Cardiovascular;  Laterality: N/A;   CORONARY STENT INTERVENTION N/A 05/30/2019   Procedure: CORONARY STENT INTERVENTION;  Surgeon: Claudene Victory ORN, MD;  Location: [facility];  Service: Cardiovascular;  Laterality: N/A;   CORONARY STENT INTERVENTION N/A 11/02/2019   Procedure: CORONARY STENT INTERVENTION;  Surgeon: Darron Deatrice LABOR, MD;  Location: [facility];  Service: Cardiovascular;  Laterality: N/A;   ESOPHAGOGASTRODUODENOSCOPY N/A 09/22/2017   Procedure: ESOPHAGOGASTRODUODENOSCOPY (EGD);  Surgeon: Aneita Gwendlyn DASEN, MD;  Location: [facility]  ENDOSCOPY;  Service: Endoscopy;  Laterality: N/A;   KNEE ARTHROSCOPY Bilateral    2 on left; 1 on the right   LEFT HEART CATH AND CORONARY  ANGIOGRAPHY N/A 11/02/2019   Procedure: LEFT HEART CATH AND CORONARY ANGIOGRAPHY;  Surgeon: Darron Deatrice LABOR, MD;  Location: [facility];  Service: Cardiovascular;  Laterality: N/A;   LEFT HEART CATH AND CORONARY ANGIOGRAPHY N/A 07/22/2020   Procedure: LEFT HEART CATH AND CORONARY ANGIOGRAPHY;  Surgeon: Anner Alm ORN, MD;  Location: [facility] INVASIVE CV LAB;  Service: Cardiovascular;  Laterality: N/A;   LEFT HEART CATH AND CORS/GRAFTS ANGIOGRAPHY N/A 08/17/2021   Procedure: LEFT HEART CATH AND CORS/GRAFTS ANGIOGRAPHY;  Surgeon: Mady Bruckner, MD;  Location: [facility];  Service: Cardiovascular;  Laterality: N/A;   PERIPHERAL VASCULAR INTERVENTION Left 10/11/2020   popliteal and SFA stent placement    PERIPHERAL VASCULAR INTERVENTION Left 10/10/2020   Procedure: PERIPHERAL VASCULAR INTERVENTION;  Surgeon: Harvey Carlin BRAVO, MD;  Location: [facility];  Service: Cardiovascular;  Laterality: Left;   RIGHT/LEFT HEART CATH AND CORONARY ANGIOGRAPHY N/A 05/30/2019   Procedure: RIGHT/LEFT HEART CATH AND CORONARY ANGIOGRAPHY;  Surgeon: Claudene Victory ORN, MD;  Location: [facility];  Service: Cardiovascular;  Laterality: N/A;   RIGHT/LEFT HEART CATH AND CORONARY/GRAFT ANGIOGRAPHY N/A 06/17/2022   Procedure: RIGHT/LEFT HEART CATH AND CORONARY/GRAFT ANGIOGRAPHY;  Surgeon: Burnard Debby LABOR, MD;  Location: [facility];  Service: Cardiovascular;  Laterality: N/A;   SHOULDER OPEN ROTATOR CUFF REPAIR Bilateral    TEE WITHOUT CARDIOVERSION N/A 07/24/2020   Procedure: TRANSESOPHAGEAL ECHOCARDIOGRAM (TEE);  Surgeon: Shyrl Linnie KIDD, MD;  Location: [facility] OR;  Service: Open Heart Surgery;  Laterality: N/A;   Social History   Occupational History   Occupation: disabled     Associate Professor: UNEMPLOYED  Tobacco Use   Smoking status: Former    Current  packs/day: 0.00    Average packs/day: 1 pack/day for 34.0 years (34.0 ttl pk-yrs)    Types: Cigarettes    Start date: 07/12/1986    Quit date: 07/12/2020    Years since quitting: 4.1   Smokeless tobacco: Former    Types: Snuff, Chew  Vaping Use   Vaping status: Never Used  Substance and Sexual Activity   Alcohol use: Yes    Comment: RARE   Drug use: No   Sexual activity: Yes

## 2024-09-21 ENCOUNTER — Other Ambulatory Visit (HOSPITAL_COMMUNITY): Payer: Self-pay

## 2024-09-24 ENCOUNTER — Other Ambulatory Visit (HOSPITAL_COMMUNITY): Payer: Self-pay

## 2024-09-24 ENCOUNTER — Other Ambulatory Visit: Payer: Self-pay

## 2024-10-01 ENCOUNTER — Other Ambulatory Visit: Payer: Self-pay

## 2024-10-02 ENCOUNTER — Other Ambulatory Visit (HOSPITAL_COMMUNITY): Payer: Self-pay

## 2024-10-02 ENCOUNTER — Other Ambulatory Visit: Payer: Self-pay

## 2024-10-02 MED ORDER — OXYCODONE HCL 15 MG PO TABS
15.0000 mg | ORAL_TABLET | ORAL | 0 refills | Status: DC
  Filled 2024-10-12: qty 150, 30d supply, fill #0

## 2024-10-02 MED ORDER — ALPRAZOLAM 2 MG PO TABS
2.0000 mg | ORAL_TABLET | Freq: Two times a day (BID) | ORAL | 0 refills | Status: DC
  Filled 2024-10-12: qty 60, 30d supply, fill #0

## 2024-10-03 ENCOUNTER — Other Ambulatory Visit (HOSPITAL_COMMUNITY): Payer: Self-pay

## 2024-10-03 ENCOUNTER — Ambulatory Visit (INDEPENDENT_AMBULATORY_CARE_PROVIDER_SITE_OTHER): Admitting: Family

## 2024-10-03 ENCOUNTER — Encounter: Payer: Self-pay | Admitting: Family

## 2024-10-03 DIAGNOSIS — Z89512 Acquired absence of left leg below knee: Secondary | ICD-10-CM | POA: Diagnosis not present

## 2024-10-03 DIAGNOSIS — L97221 Non-pressure chronic ulcer of left calf limited to breakdown of skin: Secondary | ICD-10-CM

## 2024-10-03 DIAGNOSIS — Z89511 Acquired absence of right leg below knee: Secondary | ICD-10-CM | POA: Diagnosis not present

## 2024-10-03 NOTE — Progress Notes (Signed)
 Office Visit Note   Patient: Joseph [name]           Date of Birth: 02-13-1971           MRN: [id number] Visit Date: 10/03/2024              Requested by: Alec House, MD [address], [address] [place],  [name] [address] PCP: Alec House, MD  Chief Complaint  Patient presents with   Left Leg - Follow-up      HPI: The patient is a 53 year old gentleman who is seen as for reevaluation of ulcers to his left residual limb.  Today he relates that he does stand on his distal residual limbs to pivot and get to the toilet without prostheses wonders if this is the cause of the ulcerations to the left residual limb  Denies systemic symptoms  Has been doing Vashe to dry dressing changes   Assessment & Plan: Visit Diagnoses:  No diagnosis found.   Plan: Recommended transitioning to silver  cell dressing changes daily offload the ulcerative areas plan to follow-up in 3 weeks.  He will continue cleansing with Vashe.   Follow-Up Instructions: No follow-ups on file.   Ortho Exam  Patient is alert, oriented, no adenopathy, well-dressed, normal affect, normal respiratory effort.  On examination left residual limb this has distal ulcer over the tibia this is 2-1/2 x 1.2 centimeters.  Just proximal to this has a 1 cm in diameter ulcer as well which is filled in with flat pink tissue.  There is mild surrounding maceration no erythema or signs of infection    Imaging: No results found. No images are attached to the encounter.  Labs: Lab Results  Component Value Date   HGBA1C 8.1 (H) 01/07/2024   HGBA1C 8.0 (H) 11/27/2023   HGBA1C 7.4 (H) 04/26/2023   ESRSEDRATE 62 (H) 07/01/2022   ESRSEDRATE 45 (H) 11/18/2021   ESRSEDRATE 41 (H) 09/16/2017   CRP 12.0 (H) 07/01/2022   CRP 12.1 (H) 11/18/2021   CRP 8.9 (H) 09/16/2017   REPTSTATUS 04/02/2024 FINAL 03/31/2024   CULT >=100,000 COLONIES/mL PROTEUS MIRABILIS (A) 03/31/2024   LABORGA PROTEUS MIRABILIS (A) 03/31/2024      Lab Results  Component Value Date   ALBUMIN  2.2 (L) 04/06/2024   ALBUMIN  2.4 (L) 04/05/2024   ALBUMIN  2.2 (L) 04/04/2024   PREALBUMIN 16.5 (L) 01/07/2022    Lab Results  Component Value Date   MG 2.0 04/01/2024   MG 1.5 (L) 03/31/2024   MG 1.3 (L) 03/28/2024   Lab Results  Component Value Date   VD25OH 9.40 (L) 01/07/2022    Lab Results  Component Value Date   PREALBUMIN 16.5 (L) 01/07/2022      Latest Ref Rng & Units 07/17/2024    4:09 PM 04/27/2024   10:51 AM 04/04/2024    8:15 AM  CBC EXTENDED  WBC 4.0 - 10.5 K/uL 10.7  7.4  7.5   RBC 4.22 - 5.81 MIL/uL 4.85  4.66  4.72   Hemoglobin 13.0 - 17.0 g/dL 86.5  87.2  87.1   HCT 39.0 - 52.0 % 41.0  39.0  38.8   Platelets 150 - 400 K/uL 259  242  253      There is no height or weight on file to calculate BMI.  Orders:  No orders of the defined types were placed in this encounter.  No orders of the defined types were placed in this encounter.    Procedures: No procedures performed  Clinical Data: No additional findings.  ROS:  All other systems negative, except as noted in the HPI. Review of Systems  Objective: Vital Signs: There were no vitals taken for this visit.  Specialty Comments:  No specialty comments available.  PMFS History: Patient Active Problem List   Diagnosis Date Noted   Pressure injury 04/05/2024   UTI (urinary tract infection) 04/04/2024   Peripheral edema 03/31/2024   Acute kidney failure 01/09/2024   Acute urinary retention 01/08/2024   S/P bilateral BKA (below knee amputation) (HCC) 01/07/2024   Morbid obesity with BMI of 70 and over, adult (HCC) 11/28/2023   Aortic atherosclerosis 11/28/2023   CKD (chronic kidney disease), stage III (HCC) 11/28/2023   Weakness 04/28/2023   Nephrosis 04/27/2023   AKI (acute kidney injury) 04/26/2023   OSA (obstructive sleep apnea) 04/26/2023   Hx of iron deficiency anemia 07/07/2022   Below-knee amputation of left lower extremity (HCC)     Bipolar disorder with depression (HCC)    Dyspnea 11/18/2021   Bicuspid aortic valve 09/14/2021   Aneurysm of ascending aorta 09/14/2021   DM (diabetes mellitus) with peripheral vascular complication (HCC) 08/20/2021   Essential hypertension 07/17/2021   Cellulitis of left lower leg 04/11/2021   Acute on chronic diastolic CHF (congestive heart failure) (HCC) 07/22/2020   Acute kidney injury superimposed on stage 3b chronic kidney disease (HCC) 07/18/2020   Leukocytosis 05/31/2019   Smoker 03/08/2019   Chronic back pain 02/16/2019   BMI 70 and over, adult (HCC) 02/16/2019   Peripheral nerve disease 02/16/2019   COPD (chronic obstructive pulmonary disease) (HCC) 01/22/2019   Anxiety and depression 03/26/2018   Recurrent chest pain 07/29/2016   Hyperglycemia    Insulin  dependent type 2 diabetes mellitus (HCC) 04/29/2015   History of pulmonary embolus (PE) 04/27/2013   CAD (coronary artery disease) 09/10/2008   Past Medical History:  Diagnosis Date   Aneurysm of ascending aorta 09/14/2021   Anxiety    Aortic atherosclerosis 11/28/2023   Arthritis    knees, shoulders, hips, ankles (07/29/2016)   Asthma    Bicuspid aortic valve 09/14/2021   BMI 70 and over, adult (HCC) 02/16/2019   CAD (coronary artery disease)    a. 2017: s/p BMS to distal Cx; b. LHC 05/30/2019: 80% mid, distal RCA s/p DES, 30% narrowing of d LM, widely patent LAD w/ luminal irregularities, widely patent stent in dCX  w/ 90+% stenosis distal to stent beofre small trifurcating obtuse marginal (potentially area of restenosis)   Chronic bronchitis (HCC)    Chronic ulcer of great toe of right foot (HCC) 09/09/2017   Chronic ulcer of right great toe (HCC) 09/19/2017   COPD (chronic obstructive pulmonary disease) (HCC)    Coronary arteriosclerosis    Deep venous thrombosis (HCC) 02/16/2019   Depression    Diabetes mellitus (HCC)    DVT (deep venous thrombosis) (HCC)    GERD (gastroesophageal reflux disease)     Hyperlipidemia    Hypertension    Migraine    Morbid obesity (HCC)    Morbid obesity with BMI of 70 and over, adult (HCC) 11/28/2023   Myocardial infarction (HCC) 1996   light one   Non-ST elevation (NSTEMI) myocardial infarction (HCC) 07/22/2020   PE (pulmonary embolism) 04/2013   On chronic Xarelto    Peripheral nerve disease    Pneumonia several times   Sleep apnea    Type II diabetes mellitus (HCC)     Family History  Problem Relation Age of Onset   Hypertension  Brother    Hypertension Father    Diabetes Other    Hyperlipidemia Other     Past Surgical History:  Procedure Laterality Date   ABDOMINAL AORTOGRAM W/LOWER EXTREMITY N/A 10/10/2020   Procedure: ABDOMINAL AORTOGRAM W/LOWER EXTREMITY;  Surgeon: Harvey Carlin BRAVO, MD;  Location: [facility];  Service: Cardiovascular;  Laterality: N/A;   AMPUTATION Right 09/21/2017   Procedure: RIGHT GREAT TOE AMPUTATION, POSSIBLE VAC;  Surgeon: Jerri Kay HERO, MD;  Location: [facility] OR;  Service: Orthopedics;  Laterality: Right;   AMPUTATION Left 08/13/2020   Procedure: LEFT FOOT 4TH RAY AMPUTATION;  Surgeon: Harden Jerona GAILS, MD;  Location: [facility] OR;  Service: Orthopedics;  Laterality: Left;   AMPUTATION Left 11/20/2021   Procedure: LEFT TRANSMETATARSAL AMPUTATION;  Surgeon: Harden Jerona GAILS, MD;  Location: [facility] OR;  Service: Orthopedics;  Laterality: Left;   AMPUTATION Left 01/08/2022   Procedure: LEFT BELOW KNEE AMPUTATION;  Surgeon: Harden Jerona GAILS, MD;  Location: [facility] OR;  Service: Orthopedics;  Laterality: Left;   AMPUTATION Right 07/07/2022   Procedure: RIGHT BELOW KNEE AMPUTATION;  Surgeon: Harden Jerona GAILS, MD;  Location: [facility] OR;  Service: Orthopedics;  Laterality: Right;   AORTOGRAM Bilateral 03/13/2021   Procedure: ABDOMINAL AORTOGRAM WITH Left LOWER EXTREMITY RUNOFF;  Surgeon: Magda Debby SAILOR, MD;  Location: [facility] OR;  Service: Vascular;  Laterality: Bilateral;   APPLICATION OF WOUND VAC  01/08/2022   Procedure: APPLICATION OF WOUND VAC;   Surgeon: Harden Jerona GAILS, MD;  Location: [facility] OR;  Service: Orthopedics;;   APPLICATION OF WOUND VAC Right 07/07/2022   Procedure: APPLICATION OF WOUND VAC;  Surgeon: Harden Jerona GAILS, MD;  Location: [facility] OR;  Service: Orthopedics;  Laterality: Right;   CARDIAC CATHETERIZATION  2006   CARDIAC CATHETERIZATION  1996   @ [facility]; when I had my heart attack   CARDIAC CATHETERIZATION N/A 07/29/2016   Procedure: Left Heart Cath and Coronary Angiography;  Surgeon: Dorn JINNY Lesches, MD;  Location: [facility] INVASIVE CV LAB;  Service: Cardiovascular;  Laterality: N/A;   CARDIAC CATHETERIZATION N/A 07/29/2016   Procedure: Coronary Stent Intervention;  Surgeon: Dorn JINNY Lesches, MD;  Location: [facility];  Service: Cardiovascular;  Laterality: N/A;   CARPAL TUNNEL RELEASE Bilateral    CORONARY ANGIOPLASTY WITH STENT PLACEMENT  07/29/2016   CORONARY ARTERY BYPASS GRAFT N/A 07/24/2020   Procedure: CORONARY ARTERY BYPASS GRAFTING (CABG), ON PUMP, TIMES FOUR, USING LEFT INTERNAL MAMMARY ARTERY AND ENDOSCOPICALLY HARVESTED RIGHT GREATER SAPHENOUS VEIN;  Surgeon: Shyrl Linnie KIDD, MD;  Location: [facility] OR;  Service: Open Heart Surgery;  Laterality: N/A;  FLOW TAC   CORONARY PRESSURE/FFR STUDY N/A 07/22/2020   Procedure: INTRAVASCULAR PRESSURE WIRE/FFR STUDY;  Surgeon: Anner Alm ORN, MD;  Location: [facility] INVASIVE CV LAB;  Service: Cardiovascular;  Laterality: N/A;   CORONARY STENT INTERVENTION N/A 05/30/2019   Procedure: CORONARY STENT INTERVENTION;  Surgeon: Claudene Victory ORN, MD;  Location: [facility];  Service: Cardiovascular;  Laterality: N/A;   CORONARY STENT INTERVENTION N/A 11/02/2019   Procedure: CORONARY STENT INTERVENTION;  Surgeon: Darron Deatrice LABOR, MD;  Location: [facility];  Service: Cardiovascular;  Laterality: N/A;   ESOPHAGOGASTRODUODENOSCOPY N/A 09/22/2017   Procedure: ESOPHAGOGASTRODUODENOSCOPY (EGD);  Surgeon: Aneita Gwendlyn DASEN, MD;  Location: [facility] ENDOSCOPY;  Service: Endoscopy;  Laterality: N/A;    KNEE ARTHROSCOPY Bilateral    2 on left; 1 on the right   LEFT HEART CATH AND CORONARY ANGIOGRAPHY N/A 11/02/2019   Procedure: LEFT HEART CATH AND  CORONARY ANGIOGRAPHY;  Surgeon: Darron Deatrice LABOR, MD;  Location: [facility];  Service: Cardiovascular;  Laterality: N/A;   LEFT HEART CATH AND CORONARY ANGIOGRAPHY N/A 07/22/2020   Procedure: LEFT HEART CATH AND CORONARY ANGIOGRAPHY;  Surgeon: Anner Alm ORN, MD;  Location: [facility] INVASIVE CV LAB;  Service: Cardiovascular;  Laterality: N/A;   LEFT HEART CATH AND CORS/GRAFTS ANGIOGRAPHY N/A 08/17/2021   Procedure: LEFT HEART CATH AND CORS/GRAFTS ANGIOGRAPHY;  Surgeon: Mady Bruckner, MD;  Location: [facility];  Service: Cardiovascular;  Laterality: N/A;   PERIPHERAL VASCULAR INTERVENTION Left 10/11/2020   popliteal and SFA stent placement    PERIPHERAL VASCULAR INTERVENTION Left 10/10/2020   Procedure: PERIPHERAL VASCULAR INTERVENTION;  Surgeon: Harvey Carlin BRAVO, MD;  Location: [facility];  Service: Cardiovascular;  Laterality: Left;   RIGHT/LEFT HEART CATH AND CORONARY ANGIOGRAPHY N/A 05/30/2019   Procedure: RIGHT/LEFT HEART CATH AND CORONARY ANGIOGRAPHY;  Surgeon: Claudene Victory ORN, MD;  Location: [facility];  Service: Cardiovascular;  Laterality: N/A;   RIGHT/LEFT HEART CATH AND CORONARY/GRAFT ANGIOGRAPHY N/A 06/17/2022   Procedure: RIGHT/LEFT HEART CATH AND CORONARY/GRAFT ANGIOGRAPHY;  Surgeon: Burnard Debby LABOR, MD;  Location: [facility];  Service: Cardiovascular;  Laterality: N/A;   SHOULDER OPEN ROTATOR CUFF REPAIR Bilateral    TEE WITHOUT CARDIOVERSION N/A 07/24/2020   Procedure: TRANSESOPHAGEAL ECHOCARDIOGRAM (TEE);  Surgeon: Shyrl Linnie KIDD, MD;  Location: [facility] OR;  Service: Open Heart Surgery;  Laterality: N/A;   Social History   Occupational History   Occupation: disabled     Associate Professor: UNEMPLOYED  Tobacco Use   Smoking status: Former    Current packs/day: 0.00    Average packs/day: 1 pack/day for  34.0 years (34.0 ttl pk-yrs)    Types: Cigarettes    Start date: 07/12/1986    Quit date: 07/12/2020    Years since quitting: 4.2   Smokeless tobacco: Former    Types: Snuff, Chew  Vaping Use   Vaping status: Never Used  Substance and Sexual Activity   Alcohol use: Yes    Comment: RARE   Drug use: No   Sexual activity: Yes

## 2024-10-12 ENCOUNTER — Other Ambulatory Visit (HOSPITAL_COMMUNITY): Payer: Self-pay

## 2024-10-19 ENCOUNTER — Encounter: Payer: Self-pay | Admitting: *Deleted

## 2024-10-22 ENCOUNTER — Encounter: Payer: Self-pay | Admitting: Radiology

## 2024-10-22 NOTE — Progress Notes (Deleted)
 {This patient may be at risk for Amyloid. He has one or more dx on the prob list or PMH from the following list -  Abnormal EKG, HFpEF/Diastolic CHF, Aortic Stenosis, LVH, Bilateral Carpal Tunnel Syndrome, Biceps Tendon Rupture, Spinal Stenosis, Pericardial Effusion, Left Atrial Enlargement, Conduction System Disorder. See list below or review PMH.  Diagnoses From Problem List           Noted     Acute on chronic diastolic CHF (congestive heart failure) (HCC) 07/22/2020     Peripheral nerve disease 02/16/2019    Click HERE to open Cardiac Amyloid Screening SmartSet to order screening OR Click HERE to defer testing for 1 year or permanently :1}     OFFICE NOTE:    Date:  10/22/2024  ID:  Joseph [name], DOB [birth year], MRN [id number] PCP: Alec House, MD  [facility] Providers Cardiologist:  Oneil Parchment, MD { Click to update primary MD,subspecialty MD or APP then REFRESH:1}       Patient Profile:   Coronary artery disease  S/p PCI to LCx in 07/2016 S/p CABG in 2021 Cath 08/17/2021: L-LAD, S-D3, S-OM2, S-RPDA patent  LHC 06/17/2022: Distal left main 80, mid LAD 50, D1 40, D3 60 LCx 100, proximal RCA 80, mid stent patent with 30 ISR; LIMA-LAD, SVG-Dx SVG-OM, SVG-PDA patent Chronic chest pain  MPI 05/09/24: high risk; ant-lat ischemia, EF 37, severe CAC Peripheral arterial disease  S/p partial foot amputationd S/p stent to L popliteal and L SFA S/p L BKA Jan 2023 S/p R BKA July 2023 (HFpEF) heart failure with preserved ejection fraction  Ascending thoracic aortic aneurysm  Functional bicuspid AV TTE 01/07/2022: Bicuspid aortic valve, EF 55-60, no RWMA, moderate LVH, GRII DD, normal RVSF, trivial MR, no aortic stenosis, mild dilation of ascending aorta (43 mm), mild dilation of aortic root (42 mm)   TTE 04/27/23: EF 55-60, no RWMA, mild LVH, Gr 2 DD, NL RVSF, BAV, AV sclerosis, aortic root 44 mm, ascending aorta 45 mm TTE 03/28/24: EF 55-60, no RWMA, mild LVH, NL RVSF, trivial MR,  BAV, AV sclerosis, ascending aorta 41 mm  Aortic atherosclerosis  Hypertension  Hyperlipidemia  Diabetes mellitus  Chronic kidney disease  Hx of pulmonary embolism  Chronic Obstructive Pulmonary Disease  OSA GERD          Discussed the use of AI scribe software for clinical note transcription with the patient, who gave verbal consent to proceed. History of Present Illness Joseph [name] is a 53 y.o. male for follow up of CAD. He was admitted in 03/2024 with acute CHF c/b AKI on chronic kidney disease. Pt was last seen by Jackee Alberts, NP in 04/2024 for chest pain. SPECT MPI in 04/2024 was high risk with anterolateral ischemia. Jackee reviewed with Dr. Parchment. Ranolazine  was increased with plan to proceed with cardiac catheterization if he has continued angina.     ROS-See HPI***    Studies Reviewed:      *** Results  Risk Assessment/Calculations: {Does this patient have ATRIAL FIBRILLATION?:[phone]} No BP recorded.  {Refresh Note OR Click here to enter BP  :1}***      Physical Exam:  VS:  There were no vitals taken for this visit.       Wt Readings from Last 3 Encounters:  07/17/24 (!) 340 lb (154.2 kg)  04/27/24 (!) 360 lb (163.3 kg)  04/06/24 (!) 380 lb 1.2 oz (172.4 kg)    Physical Exam***     Assessment and Plan:  Assessment & Plan Chronic heart failure with preserved ejection fraction (HFpEF) (HCC)  Coronary artery disease involving native coronary artery of native heart with angina pectoris History of prior PCI in 2017 and CABG in 2021. Cardiac catheterization in 2022 and 2023 demonstrated patent bypass grafts. He has chronic chest discomfort. Last seen in 04/2024 for chest pain. SPECT MPI in 04/2024 demonstrated anterolateral ischemia. Ranolazine  was increased with plans to pursue cardiac catheterization if symptoms continued. *** Aneurysm of ascending aorta without rupture  Bicuspid aortic valve  Essential hypertension  Assessment and Plan Assessment &  Plan    { Preoperative cardiovascular examination The patient needs to undergo eye surgery.  His wife tells me that this is to treat macular degeneration.  There is concerned that he will lose his eyesight.  He has a very complicated cardiac history.  The eye procedure is to be done under conscious sedation.  The patient is high risk for any procedure, regardless of his clinical condition.  I discussed his case today with Dr. Jeffrie (attending MD).  Given the low risk nature of his eye procedure, we agreed that the patient could proceed with his eye procedure at increased but acceptable risk.  From a cardiovascular standpoint, if it is needed, he can hold aspirin  and clopidogrel  (Plavix ) for 5 days.  This should be resumed postoperatively as soon as it is felt to be safe.   (HFpEF) heart failure with preserved ejection fraction (HCC) He is fairly sedentary.  His diet also seems to be rich in salt.  He drinks electrolyte water which has sodium in it.  I have asked him to reduce/eliminate this.  He did not have any improvement with changing furosemide  to torsemide .  Actually, he feels his volume has worsened.  Therefore, I will discontinue torsemide .  Resume furosemide  120 mg in the morning, 80 mg in the afternoon.  Obtain follow-up BMET in 1 week.  Obtain echocardiogram as previously recommended.  Of note, this does not need to be done prior to his eye surgery.  Follow-up in June as planned with Jackee.   CAD (coronary artery disease) History of prior PCI in 2017 and CABG in 2021.  Cardiac catheterization in 2022 and 2023 demonstrated patent bypass wraps.  He has chronic chest discomfort.  His anginal symptoms have worsened over the past several weeks.  I suspect that this is all related to volume excess.  As noted, I reviewed his case with Dr. Jeffrie.  We do not feel that he needs to undergo repeat cardiac catheterization given his prior results less than 1 year ago.  Given his size, a nuclear stress test  will not likely provide adequate results.  He was unable to tolerate Imdur  in the past.  Continue Ranexa  500 mg twice daily, amlodipine  10 mg daily, aspirin  81 mg daily, Lipitor  80 mg daily, carvedilol  12.5 mg twice daily, Plavix  75 mg daily.  Proceed with adjustments in furosemide  as noted.  If his angina does not improve with diuresis, consider increasing dose of Ranexa .  Keep follow-up in June as planned.   Aneurysm of ascending aorta (HCC) 43 mm was on echocardiogram in 2023.  Repeat echocardiogram pending.   Essential hypertension Blood pressure is well-controlled.  Continue amlodipine  10 mg daily, Coreg  12.5 mg twice daily.   Bicuspid aortic valve He has an aortic stenosis murmur on exam.  He did not have significant aortic stenosis by echocardiogram in 2023.  Therefore, it is not likely that he has significant aortic stenosis at  this time.  However, he does need a follow-up echocardiogram to document this.  We will try to reschedule this for him.  He notes transportation issues.  It has been difficult to get here for his echocardiogram.  He also is unable to see Dr. Raford at the [facility] location.  He requests transfer to [facility].  I have reviewed this with Dr. Jeffrie.  We will try to transfer him to [facility] to be followed by Dr. Jeffrie.     :1}    {Are you ordering a CV Procedure (e.g. stress test, cath, DCCV, TEE, etc)?   Press F2        :[id number]}  Dispo:  No follow-ups on file.  Signed, Glendia Ferrier, PA-C

## 2024-10-22 NOTE — Assessment & Plan Note (Deleted)
 History of prior PCI in 2017 and CABG in 2021. Cardiac catheterization in 2022 and 2023 demonstrated patent bypass grafts. He has chronic chest discomfort. Last seen in 04/2024 for chest pain. SPECT MPI in 04/2024 demonstrated anterolateral ischemia. Ranolazine  was increased with plans to pursue cardiac catheterization if symptoms continued. ***

## 2024-10-23 ENCOUNTER — Ambulatory Visit: Admitting: Physician Assistant

## 2024-10-23 DIAGNOSIS — I7121 Aneurysm of the ascending aorta, without rupture: Secondary | ICD-10-CM

## 2024-10-23 DIAGNOSIS — I10 Essential (primary) hypertension: Secondary | ICD-10-CM

## 2024-10-23 DIAGNOSIS — I5032 Chronic diastolic (congestive) heart failure: Secondary | ICD-10-CM

## 2024-10-23 DIAGNOSIS — Q2381 Bicuspid aortic valve: Secondary | ICD-10-CM

## 2024-10-23 DIAGNOSIS — I25119 Atherosclerotic heart disease of native coronary artery with unspecified angina pectoris: Secondary | ICD-10-CM

## 2024-10-24 ENCOUNTER — Ambulatory Visit: Admitting: Family

## 2024-10-27 ENCOUNTER — Other Ambulatory Visit: Payer: Self-pay | Admitting: Nurse Practitioner

## 2024-10-29 ENCOUNTER — Other Ambulatory Visit: Payer: Self-pay

## 2024-10-29 ENCOUNTER — Other Ambulatory Visit (HOSPITAL_COMMUNITY): Payer: Self-pay

## 2024-10-30 ENCOUNTER — Ambulatory Visit (INDEPENDENT_AMBULATORY_CARE_PROVIDER_SITE_OTHER): Admitting: Physician Assistant

## 2024-10-30 ENCOUNTER — Other Ambulatory Visit (HOSPITAL_COMMUNITY): Payer: Self-pay

## 2024-10-30 ENCOUNTER — Encounter: Payer: Self-pay | Admitting: Physician Assistant

## 2024-10-30 DIAGNOSIS — L89899 Pressure ulcer of other site, unspecified stage: Secondary | ICD-10-CM

## 2024-10-30 DIAGNOSIS — Z89512 Acquired absence of left leg below knee: Secondary | ICD-10-CM | POA: Diagnosis not present

## 2024-10-30 DIAGNOSIS — Z89511 Acquired absence of right leg below knee: Secondary | ICD-10-CM | POA: Diagnosis not present

## 2024-10-30 MED ORDER — INSULIN LISPRO (1 UNIT DIAL) 100 UNIT/ML (KWIKPEN)
15.0000 [IU] | PEN_INJECTOR | Freq: Three times a day (TID) | SUBCUTANEOUS | 3 refills | Status: AC
  Filled 2024-10-30: qty 45, 100d supply, fill #0

## 2024-10-30 NOTE — Progress Notes (Signed)
 Office Visit Note   Patient: Joseph [name]           Date of Birth: [birth year]           MRN: [id number] Visit Date: 10/30/2024              Requested by: Alec House, MD [address] [place],  [name] [address] PCP: Alec House, MD  Chief Complaint  Patient presents with   Left Leg - Wound Check      HPI: 53 y/o male with bilateral BKA.  He fell and landed on his stumps.  The right one has healed and the left is in the process of healing.  He has been doing Vashe dressing changes.  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  He wants to wear his prothesis as soon as possible.    Assessment & Plan: Visit Diagnoses: No diagnosis found.  Plan: He will [facility]sis [facility] have [facility]sis re evaluated.  He needs new shirinker socks and to see if he wears the left one part time will all the pressure be off the wounds.  Silver  alginate dry guaze and ace wrap compression  Follow-Up Instructions: Return in about 4 weeks (around 11/27/2024).   Ortho Exam  Patient is alert, oriented, no adenopathy, well-dressed, normal affect, normal respiratory effort. The proximal wound measures 1.2 x 0.9 cm and the distal wound is 2.3 cm x 2 cm.  There is a skin island where it was one large wound originally.  No cellulitis or active drainage.         Imaging: No results found. No images are attached to the encounter.  Labs: Lab Results  Component Value Date   HGBA1C 8.1 (H) 01/07/2024   HGBA1C 8.0 (H) 11/27/2023   HGBA1C 7.4 (H) 04/26/2023   ESRSEDRATE 62 (H) 07/01/2022   ESRSEDRATE 45 (H) 11/18/2021   ESRSEDRATE 41 (H) 09/16/2017   CRP 12.0 (H) 07/01/2022   CRP 12.1 (H) 11/18/2021   CRP 8.9 (H) 09/16/2017   REPTSTATUS 04/02/2024 FINAL 03/31/2024   CULT >=100,000 COLONIES/mL PROTEUS MIRABILIS (A) 03/31/2024   LABORGA PROTEUS MIRABILIS (A) 03/31/2024     Lab Results  Component Value  Date   ALBUMIN  2.2 (L) 04/06/2024   ALBUMIN  2.4 (L) 04/05/2024   ALBUMIN  2.2 (L) 04/04/2024   PREALBUMIN 16.5 (L) 01/07/2022    Lab Results  Component Value Date   MG 2.0 04/01/2024   MG 1.5 (L) 03/31/2024   MG 1.3 (L) 03/28/2024   Lab Results  Component Value Date   VD25OH 9.40 (L) 01/07/2022    Lab Results  Component Value Date   PREALBUMIN 16.5 (L) 01/07/2022      Latest Ref Rng & Units 07/17/2024    4:09 PM 04/27/2024   10:51 AM 04/04/2024    8:15 AM  CBC EXTENDED  WBC 4.0 - 10.5 K/uL 10.7  7.4  7.5   RBC 4.22 - 5.81 MIL/uL 4.85  4.66  4.72   Hemoglobin 13.0 - 17.0 g/dL 86.5  87.2  87.1   HCT 39.0 - 52.0 % 41.0  39.0  38.8   Platelets 150 - 400 K/uL 259  242  253      There is no height or weight on file to calculate BMI.  Orders:  No orders of the defined types were placed in this encounter.  No orders of the defined types  were placed in this encounter.    Procedures: No procedures performed  Clinical Data: No additional findings.  ROS:  All other systems negative, except as noted in the HPI. Review of Systems  Objective: Vital Signs: There were no vitals taken for this visit.  Specialty Comments:  No specialty comments available.  PMFS History: Patient Active Problem List   Diagnosis Date Noted   Pressure injury 04/05/2024   UTI (urinary tract infection) 04/04/2024   Peripheral edema 03/31/2024   Acute kidney failure 01/09/2024   Acute urinary retention 01/08/2024   S/P bilateral BKA (below knee amputation) (HCC) 01/07/2024   Morbid obesity with BMI of 70 and over, adult (HCC) 11/28/2023   Aortic atherosclerosis 11/28/2023   CKD (chronic kidney disease), stage III (HCC) 11/28/2023   Weakness 04/28/2023   Nephrosis 04/27/2023   AKI (acute kidney injury) 04/26/2023   OSA (obstructive sleep apnea) 04/26/2023   Hx of iron deficiency anemia 07/07/2022   Below-knee amputation of left lower extremity (HCC)    Bipolar disorder with depression  (HCC)    Dyspnea 11/18/2021   Bicuspid aortic valve 09/14/2021   Aneurysm of ascending aorta 09/14/2021   DM (diabetes mellitus) with peripheral vascular complication (HCC) 08/20/2021   Essential hypertension 07/17/2021   Cellulitis of left lower leg 04/11/2021   Acute on chronic diastolic CHF (congestive heart failure) (HCC) 07/22/2020   Acute kidney injury superimposed on stage 3b chronic kidney disease (HCC) 07/18/2020   Leukocytosis 05/31/2019   Smoker 03/08/2019   Chronic back pain 02/16/2019   BMI 70 and over, adult (HCC) 02/16/2019   Peripheral nerve disease 02/16/2019   COPD (chronic obstructive pulmonary disease) (HCC) 01/22/2019   Anxiety and depression 03/26/2018   Recurrent chest pain 07/29/2016   Hyperglycemia    Insulin  dependent type 2 diabetes mellitus (HCC) 04/29/2015   History of pulmonary embolus (PE) 04/27/2013   CAD (coronary artery disease) 09/10/2008   Past Medical History:  Diagnosis Date   Aneurysm of ascending aorta 09/14/2021   Anxiety    Aortic atherosclerosis 11/28/2023   Arthritis    knees, shoulders, hips, ankles (07/29/2016)   Asthma    Bicuspid aortic valve 09/14/2021   BMI 70 and over, adult (HCC) 02/16/2019   CAD (coronary artery disease)    a. 2017: s/p BMS to distal Cx; b. LHC 05/30/2019: 80% mid, distal RCA s/p DES, 30% narrowing of d LM, widely patent LAD w/ luminal irregularities, widely patent stent in dCX  w/ 90+% stenosis distal to stent beofre small trifurcating obtuse marginal (potentially area of restenosis)   Chronic bronchitis (HCC)    Chronic ulcer of great toe of right foot (HCC) 09/09/2017   Chronic ulcer of right great toe (HCC) 09/19/2017   COPD (chronic obstructive pulmonary disease) (HCC)    Coronary arteriosclerosis    Deep venous thrombosis (HCC) 02/16/2019   Depression    Diabetes mellitus (HCC)    DVT (deep venous thrombosis) (HCC)    GERD (gastroesophageal reflux disease)    Hyperlipidemia    Hypertension     Migraine    Morbid obesity (HCC)    Morbid obesity with BMI of 70 and over, adult (HCC) 11/28/2023   Myocardial infarction (HCC) 1996   light one   Non-ST elevation (NSTEMI) myocardial infarction (HCC) 07/22/2020   PE (pulmonary embolism) 04/2013   On chronic Xarelto    Peripheral nerve disease    Pneumonia several times   Sleep apnea    Type II diabetes mellitus (HCC)  Family History  Problem Relation Age of Onset   Hypertension Brother    Hypertension Father    Diabetes Other    Hyperlipidemia Other     Past Surgical History:  Procedure Laterality Date   ABDOMINAL AORTOGRAM W/LOWER EXTREMITY N/A 10/10/2020   Procedure: ABDOMINAL AORTOGRAM W/LOWER EXTREMITY;  Surgeon: Harvey Carlin BRAVO, MD;  Location: [facility];  Service: Cardiovascular;  Laterality: N/A;   AMPUTATION Right 09/21/2017   Procedure: RIGHT GREAT TOE AMPUTATION, POSSIBLE VAC;  Surgeon: Jerri Kay HERO, MD;  Location: [facility] OR;  Service: Orthopedics;  Laterality: Right;   AMPUTATION Left 08/13/2020   Procedure: LEFT FOOT 4TH RAY AMPUTATION;  Surgeon: Harden Jerona GAILS, MD;  Location: [facility] OR;  Service: Orthopedics;  Laterality: Left;   AMPUTATION Left 11/20/2021   Procedure: LEFT TRANSMETATARSAL AMPUTATION;  Surgeon: Harden Jerona GAILS, MD;  [facility] OR;  Service: Orthopedics;  Laterality: Left;   AMPUTATION Left 01/08/2022   Procedure: LEFT BELOW KNEE AMPUTATION;  Surgeon: Harden Jerona GAILS, MD;  Location: [facility] OR;  Service: Orthopedics;  Laterality: Left;   AMPUTATION Right 07/07/2022   Procedure: RIGHT BELOW KNEE AMPUTATION;  Surgeon: Harden Jerona GAILS, MD;  Location: [facility] OR;  Service: Orthopedics;  Laterality: Right;   AORTOGRAM Bilateral 03/13/2021   Procedure: ABDOMINAL AORTOGRAM WITH Left LOWER EXTREMITY RUNOFF;  Surgeon: Magda Debby SAILOR, MD;  Location: [facility] OR;  Service: Vascular;  Laterality: Bilateral;   APPLICATION OF WOUND VAC  01/08/2022   Procedure: APPLICATION OF WOUND VAC;  Surgeon: Harden Jerona GAILS, MD;  Location:  [facility] OR;  Service: Orthopedics;;   APPLICATION OF WOUND VAC Right 07/07/2022   Procedure: APPLICATION OF WOUND VAC;  Surgeon: Harden Jerona GAILS, MD;  Location: [facility] OR;  Service: Orthopedics;  Laterality: Right;   CARDIAC CATHETERIZATION  2006   CARDIAC CATHETERIZATION  1996   @ [facility]; when I had my heart attack   CARDIAC CATHETERIZATION N/A 07/29/2016   Procedure: Left Heart Cath and Coronary Angiography;  Surgeon: Dorn JINNY Lesches, MD;  Location: [facility] INVASIVE CV LAB;  Service: Cardiovascular;  Laterality: N/A;   CARDIAC CATHETERIZATION N/A 07/29/2016   Procedure: Coronary Stent Intervention;  Surgeon: Dorn JINNY Lesches, MD;  Location: [facility];  Service: Cardiovascular;  Laterality: N/A;   CARPAL TUNNEL RELEASE Bilateral    CORONARY ANGIOPLASTY WITH STENT PLACEMENT  07/29/2016   CORONARY ARTERY BYPASS GRAFT N/A 07/24/2020   Procedure: CORONARY ARTERY BYPASS GRAFTING (CABG), ON PUMP, TIMES FOUR, USING LEFT INTERNAL MAMMARY ARTERY AND ENDOSCOPICALLY HARVESTED RIGHT GREATER SAPHENOUS VEIN;  Surgeon: Shyrl Linnie KIDD, MD;  Location: [facility] OR;  Service: Open Heart Surgery;  Laterality: N/A;  FLOW TAC   CORONARY PRESSURE/FFR STUDY N/A 07/22/2020   Procedure: INTRAVASCULAR PRESSURE WIRE/FFR STUDY;  Surgeon: Anner Alm ORN, MD;  Location: [facility] INVASIVE CV LAB;  Service: Cardiovascular;  Laterality: N/A;   CORONARY STENT INTERVENTION N/A 05/30/2019   Procedure: CORONARY STENT INTERVENTION;  Surgeon: Claudene Victory ORN, MD;  Location: [facility];  Service: Cardiovascular;  Laterality: N/A;   CORONARY STENT INTERVENTION N/A 11/02/2019   Procedure: CORONARY STENT INTERVENTION;  Surgeon: Darron Deatrice LABOR, MD;  Location: [facility];  Service: Cardiovascular;  Laterality: N/A;   ESOPHAGOGASTRODUODENOSCOPY N/A 09/22/2017   Procedure: ESOPHAGOGASTRODUODENOSCOPY (EGD);  Surgeon: Aneita Gwendlyn DASEN, MD;  Location: [facility] ENDOSCOPY;  Service: Endoscopy;  Laterality: N/A;   KNEE ARTHROSCOPY Bilateral    2 on  left; 1 on the right   LEFT HEART CATH AND  CORONARY ANGIOGRAPHY N/A 11/02/2019   Procedure: LEFT HEART CATH AND CORONARY ANGIOGRAPHY;  Surgeon: Darron Deatrice LABOR, MD;  Location: [facility];  Service: Cardiovascular;  Laterality: N/A;   LEFT HEART CATH AND CORONARY ANGIOGRAPHY N/A 07/22/2020   Procedure: LEFT HEART CATH AND CORONARY ANGIOGRAPHY;  Surgeon: Anner Alm ORN, MD;  Location: [facility] INVASIVE CV LAB;  Service: Cardiovascular;  Laterality: N/A;   LEFT HEART CATH AND CORS/GRAFTS ANGIOGRAPHY N/A 08/17/2021   Procedure: LEFT HEART CATH AND CORS/GRAFTS ANGIOGRAPHY;  Surgeon: Mady Bruckner, MD;  Location: [facility];  Service: Cardiovascular;  Laterality: N/A;   PERIPHERAL VASCULAR INTERVENTION Left 10/11/2020   popliteal and SFA stent placement    PERIPHERAL VASCULAR INTERVENTION Left 10/10/2020   Procedure: PERIPHERAL VASCULAR INTERVENTION;  Surgeon: Harvey Carlin BRAVO, MD;  Location: [facility];  Service: Cardiovascular;  Laterality: Left;   RIGHT/LEFT HEART CATH AND CORONARY ANGIOGRAPHY N/A 05/30/2019   Procedure: RIGHT/LEFT HEART CATH AND CORONARY ANGIOGRAPHY;  Surgeon: Claudene Victory ORN, MD;  Location: [facility];  Service: Cardiovascular;  Laterality: N/A;   RIGHT/LEFT HEART CATH AND CORONARY/GRAFT ANGIOGRAPHY N/A 06/17/2022   Procedure: RIGHT/LEFT HEART CATH AND CORONARY/GRAFT ANGIOGRAPHY;  Surgeon: Burnard Debby LABOR, MD;  Location: [facility];  Service: Cardiovascular;  Laterality: N/A;   SHOULDER OPEN ROTATOR CUFF REPAIR Bilateral    TEE WITHOUT CARDIOVERSION N/A 07/24/2020   Procedure: TRANSESOPHAGEAL ECHOCARDIOGRAM (TEE);  Surgeon: Shyrl Linnie KIDD, MD;  Location: [facility] OR;  Service: Open Heart Surgery;  Laterality: N/A;   Social History   Occupational History   Occupation: disabled     Associate Professor: UNEMPLOYED  Tobacco Use   Smoking status: Former    Current packs/day: 0.00    Average packs/day: 1 pack/day for 34.0 years (34.0 ttl pk-yrs)    Types:  Cigarettes    Start date: 07/12/1986    Quit date: 07/12/2020    Years since quitting: 4.3   Smokeless tobacco: Former    Types: Snuff, Chew  Vaping Use   Vaping status: Never Used  Substance and Sexual Activity   Alcohol use: Yes    Comment: RARE   Drug use: No   Sexual activity: Yes

## 2024-10-31 ENCOUNTER — Ambulatory Visit: Admitting: Nurse Practitioner

## 2024-10-31 NOTE — Progress Notes (Deleted)
 Office Visit    Patient Name: Joseph [name] Date of Encounter: 10/31/2024  Primary Care Provider:  Alec House, MD Primary Cardiologist:  Oneil Parchment, MD  Chief Complaint    53 year old male with a history of CAD s/p DES-LCx in 2017, s/p CABG x 4 (LIMA-LAD, SVG-PDA, SVG-OM1, SVG-D1) in 2021, aortic atherosclerosis, chronic HFpEF, PE, bicuspid aortic valve, mild dilation of the ascending aorta, PAD s/p bilateral BKA, hypertension, hyperlipidemia, type 2 diabetes, CKD, COPD, OSA, and GERD who presents for follow-up related to CAD.  Past Medical History    Past Medical History:  Diagnosis Date   Aneurysm of ascending aorta 09/14/2021   Anxiety    Aortic atherosclerosis 11/28/2023   Arthritis    knees, shoulders, hips, ankles (07/29/2016)   Asthma    Bicuspid aortic valve 09/14/2021   BMI 70 and over, adult (HCC) 02/16/2019   CAD (coronary artery disease)    a. 2017: s/p BMS to distal Cx; b. LHC 05/30/2019: 80% mid, distal RCA s/p DES, 30% narrowing of d LM, widely patent LAD w/ luminal irregularities, widely patent stent in dCX  w/ 90+% stenosis distal to stent beofre small trifurcating obtuse marginal (potentially area of restenosis)   Chronic bronchitis (HCC)    Chronic ulcer of great toe of right foot (HCC) 09/09/2017   Chronic ulcer of right great toe (HCC) 09/19/2017   COPD (chronic obstructive pulmonary disease) (HCC)    Coronary arteriosclerosis    Deep venous thrombosis (HCC) 02/16/2019   Depression    Diabetes mellitus (HCC)    DVT (deep venous thrombosis) (HCC)    GERD (gastroesophageal reflux disease)    Hyperlipidemia    Hypertension    Migraine    Morbid obesity (HCC)    Morbid obesity with BMI of 70 and over, adult (HCC) 11/28/2023   Myocardial infarction (HCC) 1996   light one   Non-ST elevation (NSTEMI) myocardial infarction (HCC) 07/22/2020   PE (pulmonary embolism) 04/2013   On chronic Xarelto    Peripheral nerve disease    Pneumonia several  times   Sleep apnea    Type II diabetes mellitus (HCC)    Past Surgical History:  Procedure Laterality Date   ABDOMINAL AORTOGRAM W/LOWER EXTREMITY N/A 10/10/2020   Procedure: ABDOMINAL AORTOGRAM W/LOWER EXTREMITY;  Surgeon: Harvey Carlin BRAVO, MD;  Location: [facility];  Service: Cardiovascular;  Laterality: N/A;   AMPUTATION Right 09/21/2017   Procedure: RIGHT GREAT TOE AMPUTATION, POSSIBLE VAC;  Surgeon: Jerri Kay HERO, MD;  Location: [facility] OR;  Service: Orthopedics;  Laterality: Right;   AMPUTATION Left 08/13/2020   Procedure: LEFT FOOT 4TH RAY AMPUTATION;  Surgeon: Harden Jerona GAILS, MD;  Location: [facility] OR;  Service: Orthopedics;  Laterality: Left;   AMPUTATION Left 11/20/2021   Procedure: LEFT TRANSMETATARSAL AMPUTATION;  Surgeon: Harden Jerona GAILS, MD;  Location: [facility] OR;  Service: Orthopedics;  Laterality: Left;   AMPUTATION Left 01/08/2022   Procedure: LEFT BELOW KNEE AMPUTATION;  Surgeon: Harden Jerona GAILS, MD;  Location: [facility] OR;  Service: Orthopedics;  Laterality: Left;   AMPUTATION Right 07/07/2022   Procedure: RIGHT BELOW KNEE AMPUTATION;  Surgeon: Harden Jerona GAILS, MD;  Location: [facility] OR;  Service: Orthopedics;  Laterality: Right;   AORTOGRAM Bilateral 03/13/2021   Procedure: ABDOMINAL AORTOGRAM WITH Left LOWER EXTREMITY RUNOFF;  Surgeon: Magda Debby SAILOR, MD;  Location: [facility] OR;  Service: Vascular;  Laterality: Bilateral;   APPLICATION OF WOUND VAC  01/08/2022   Procedure: APPLICATION OF WOUND VAC;  Surgeon: Harden,  Jerona GAILS, MD;  Location: [facility] OR;  Service: Orthopedics;;   APPLICATION OF WOUND VAC Right 07/07/2022   Procedure: APPLICATION OF WOUND VAC;  Surgeon: Harden Jerona GAILS, MD;  Location: [facility] OR;  Service: Orthopedics;  Laterality: Right;   CARDIAC CATHETERIZATION  2006   CARDIAC CATHETERIZATION  1996   @ [facility]; when I had my heart attack   CARDIAC CATHETERIZATION N/A 07/29/2016   Procedure: Left Heart Cath and Coronary Angiography;  Surgeon: Dorn JINNY Lesches, MD;  Location: [facility] INVASIVE CV LAB;   Service: Cardiovascular;  Laterality: N/A;   CARDIAC CATHETERIZATION N/A 07/29/2016   Procedure: Coronary Stent Intervention;  Surgeon: Dorn JINNY Lesches, MD;  Location: [facility];  Service: Cardiovascular;  Laterality: N/A;   CARPAL TUNNEL RELEASE Bilateral    CORONARY ANGIOPLASTY WITH STENT PLACEMENT  07/29/2016   CORONARY ARTERY BYPASS GRAFT N/A 07/24/2020   Procedure: CORONARY ARTERY BYPASS GRAFTING (CABG), ON PUMP, TIMES FOUR, USING LEFT INTERNAL MAMMARY ARTERY AND ENDOSCOPICALLY HARVESTED RIGHT GREATER SAPHENOUS VEIN;  Surgeon: Shyrl Linnie KIDD, MD;  Location: [facility] OR;  Service: Open Heart Surgery;  Laterality: N/A;  FLOW TAC   CORONARY PRESSURE/FFR STUDY N/A 07/22/2020   Procedure: INTRAVASCULAR PRESSURE WIRE/FFR STUDY;  Surgeon: Anner Alm ORN, MD;  Location: [facility] INVASIVE CV LAB;  Service: Cardiovascular;  Laterality: N/A;   CORONARY STENT INTERVENTION N/A 05/30/2019   Procedure: CORONARY STENT INTERVENTION;  Surgeon: Claudene Victory ORN, MD;  Location: [facility];  Service: Cardiovascular;  Laterality: N/A;   CORONARY STENT INTERVENTION N/A 11/02/2019   Procedure: CORONARY STENT INTERVENTION;  Surgeon: Darron Deatrice LABOR, MD;  Location: [facility];  Service: Cardiovascular;  Laterality: N/A;   ESOPHAGOGASTRODUODENOSCOPY N/A 09/22/2017   Procedure: ESOPHAGOGASTRODUODENOSCOPY (EGD);  Surgeon: Aneita Gwendlyn DASEN, MD;  Location: [facility] ENDOSCOPY;  Service: Endoscopy;  Laterality: N/A;   KNEE ARTHROSCOPY Bilateral    2 on left; 1 on the right   LEFT HEART CATH AND CORONARY ANGIOGRAPHY N/A 11/02/2019   Procedure: LEFT HEART CATH AND CORONARY ANGIOGRAPHY;  Surgeon: Darron Deatrice LABOR, MD;  [facility]: [facility];  Service: Cardiovascular;  Laterality: N/A;   LEFT HEART CATH AND CORONARY ANGIOGRAPHY N/A 07/22/2020   Procedure: LEFT HEART CATH AND CORONARY ANGIOGRAPHY;  Surgeon: Anner Alm ORN, MD;  Location: [facility] INVASIVE CV LAB;  Service: Cardiovascular;  Laterality: N/A;   LEFT  HEART CATH AND CORS/GRAFTS ANGIOGRAPHY N/A 08/17/2021   Procedure: LEFT HEART CATH AND CORS/GRAFTS ANGIOGRAPHY;  Surgeon: Mady Bruckner, MD;  Location: [facility];  Service: Cardiovascular;  Laterality: N/A;   PERIPHERAL VASCULAR INTERVENTION Left 10/11/2020   popliteal and SFA stent placement    PERIPHERAL VASCULAR INTERVENTION Left 10/10/2020   Procedure: PERIPHERAL VASCULAR INTERVENTION;  Surgeon: Harvey Carlin BRAVO, MD;  [facility]: [facility];  Service: Cardiovascular;  Laterality: Left;   RIGHT/LEFT HEART CATH AND CORONARY ANGIOGRAPHY N/A 05/30/2019   Procedure: RIGHT/LEFT HEART CATH AND CORONARY ANGIOGRAPHY;  Surgeon: Claudene Victory ORN, MD;  [facility]: [facility];  Service: Cardiovascular;  Laterality: N/A;   RIGHT/LEFT HEART CATH AND CORONARY/GRAFT ANGIOGRAPHY N/A 06/17/2022   Procedure: RIGHT/LEFT HEART CATH AND CORONARY/GRAFT ANGIOGRAPHY;  Surgeon: Burnard Debby LABOR, MD;  Location: [facility];  Service: Cardiovascular;  Laterality: N/A;   SHOULDER OPEN ROTATOR CUFF REPAIR Bilateral    TEE WITHOUT CARDIOVERSION N/A 07/24/2020   Procedure: TRANSESOPHAGEAL ECHOCARDIOGRAM (TEE);  Surgeon: Shyrl Linnie KIDD, MD;  Location: [facility] OR;  Service: Open Heart Surgery;  Laterality: N/A;    Allergies  Allergies  Allergen Reactions   Duloxetine Other (See Comments)    Headaches   Sulfa  Antibiotics Other (See Comments)    Headaches      Labs/Other Studies Reviewed    The following studies were reviewed today:  Cardiac Studies & Procedures   ______________________________________________________________________________________________ CARDIAC CATHETERIZATION  CARDIAC CATHETERIZATION 06/17/2022  Conclusion   Mid LM to Ost LAD lesion is 80% stenosed with 40% stenosed side branch in Ost Cx to Prox Cx.   Mid Cx-2 lesion is 5% stenosed.   Dist Cx lesion is 70% stenosed.   Mid Cx-1 lesion is 100% stenosed.   Prox RCA to Mid RCA lesion is 30% stenosed.   Dist RCA  lesion is 50% stenosed with 50% stenosed side branch in RPDA.   Prox RCA lesion is 80% stenosed.   Prox Graft to Mid Graft lesion is 30% stenosed.   1st Diag lesion is 40% stenosed.   3rd Diag lesion is 60% stenosed.   Mid LAD lesion is 50% stenosed.   SVG and is large.   SVG and is normal in caliber.   SVG and is large.   The graft exhibits no disease.   The graft exhibits no disease.  Severe native CAD with 80% distal left main stenosis with widely patent LIMA graft supplying the LAD and SVG supplying the diagonal vessel.  The native circumflex is totally occluded with a widely patent vein graft supplying the distal OM vessel.  The native dominant RCA has 80% focal proximal stenosis with mild in-stent restenosis in the previously placed mid stent with 50% narrowing at the ostium of the PDA with widely patent SVG to PDA.  Normal right heart pressures with mean PA pressure at 21 mmHg/  RECOMMENDATION: Medical therapy.  Continue long-term DAPT.  Optimal blood pressure control with target blood pressure less than 130/80.  Aggressive lipid-lowering therapy with target LDL less than 55.  Findings Coronary Findings Diagnostic  Dominance: Right  Left Main Vessel is large. Mid LM to Ost LAD lesion is 80% stenosed with 40% stenosed side branch in Ost Cx to Prox Cx. The lesion is concentric. The lesion is mildly calcified. Ostial and proximal LCx disease is mildly calcified, eccentric and irregular  Left Anterior Descending There is mild diffuse disease throughout the vessel. Mid LAD lesion is 50% stenosed.  First Diagonal Branch Vessel is small in size. 1st Diag lesion is 40% stenosed.  Third Diagonal Branch Vessel is small in size. 3rd Diag lesion is 60% stenosed. The lesion is focal and eccentric.  Left Circumflex Vessel is normal in caliber. The vessel is moderately calcified. The vessel is tortuous. The distal vessel terminates as a branching lateral OM into OM 2-OM 3 Mid  Cx-1 lesion is 100% stenosed. Competitive flow from SVG-OM noted. Mid Cx-2 lesion is 5% stenosed. The lesion was previously treated using a bare metal stent and a drug eluting stent between 1-2 years ago. Overlapping stents Dist Cx lesion is 70% stenosed.  First Obtuse Marginal Branch Vessel is small in size.  Fourth Obtuse Marginal Branch Vessel is small in size.  Left Posterior Atrioventricular Artery Vessel is small in size.  Right Coronary Artery Prox RCA lesion is 80% stenosed. Prox RCA to Mid RCA lesion is 30% stenosed. The lesion was previously treated using a drug eluting stent . Dist RCA lesion is 50% stenosed with 50% stenosed side branch in RPDA.  Acute Marginal Branch Vessel is small in size.  Right Posterior Atrioventricular Artery Vessel is small in size.  First Right Posterolateral Branch Vessel is small in size.  Saphenous Graft To RPDA SVG and is large. Prox Graft to Mid Graft lesion is 30% stenosed.  Saphenous Graft To 3rd Diag SVG and is normal in caliber.  The graft exhibits no disease.  Saphenous Graft To Dist Cx SVG and is large.  The graft exhibits no disease.  LIMA Graft To Mid LAD  Intervention  No interventions have been documented.   CARDIAC CATHETERIZATION  CARDIAC CATHETERIZATION 08/17/2021  Conclusion Conclusions: Severe native coronary artery disease, as detailed below, including functional occlusions of the mid LAD and mid LCx with competitive flow from bypass grafts, 70% distal LMCA stenosis, and multifocal RCA disease of up to 70%. Widely patent LIMA-LAD, SVG-D3, and SVG-OM2. Patent SVG-RPDA with 30% proximal/mid graft stenosis. Moderately elevated left heart filling pressure with significant respiratory variation (LVEDP 30 mmHg end-expiratory, 20-37mmHg mean).  Recommendations: No targets for PCI evident on today's catheterization.  Optimize medical therapy; will add isosorbide  mononitrate in place of nitroglycerin   infusion. Diuresis given elevated LVEDP. Aggressive secondary prevention.  Lonni Hanson, MD [facility]  Findings Coronary Findings Diagnostic  Dominance: Right  Left Main Vessel is large. Mid LM to Ost LAD lesion is 70% stenosed with 55% stenosed side branch in Ost Cx to Prox Cx. The lesion is concentric. The lesion is mildly calcified. Ostial and proximal LCx disease is mildly calcified, eccentric and irregular  Left Anterior Descending There is mild diffuse disease throughout the vessel. Mid LAD lesion is 100% stenosed. Competitive flow noted from SVG-D and LIMA-LAD.  First Diagonal Branch Vessel is small in size. 1st Diag lesion is 60% stenosed.  Third Diagonal Branch Vessel is small in size. 3rd Diag lesion is 60% stenosed. The lesion is focal and eccentric.  Left Circumflex Vessel is normal in caliber. The vessel is moderately calcified. The vessel is tortuous. The distal vessel terminates as a branching lateral OM into OM 2-OM 3 Mid Cx-1 lesion is 100% stenosed. Competitive flow from SVG-OM noted. Mid Cx-2 lesion is 5% stenosed. The lesion was previously treated using a bare metal stent and a drug eluting stent between 1-2 years ago. Overlapping stents Dist Cx lesion is 70% stenosed.  First Obtuse Marginal Branch Vessel is small in size.  Fourth Obtuse Marginal Branch Vessel is small in size.  Left Posterior Atrioventricular Artery Vessel is small in size.  Right Coronary Artery Prox RCA lesion is 70% stenosed. Prox RCA to Mid RCA lesion is 40% stenosed. The lesion was previously treated using a drug eluting stent . Dist RCA lesion is 50% stenosed with 50% stenosed side branch in RPDA.  Acute Marginal Branch Vessel is small in size.  Right Posterior Atrioventricular Artery Vessel is small in size.  First Right Posterolateral Branch Vessel is small in size.  Saphenous Graft To RPDA SVG graft was visualized by angiography and is large. Prox Graft  to Mid Graft lesion is 30% stenosed.  Saphenous Graft To 3rd Diag SVG graft was visualized by angiography and is normal in caliber.  The graft exhibits no disease.  Saphenous Graft To Dist Cx SVG graft was visualized by angiography and is large.  The graft exhibits no disease.  LIMA LIMA Graft To Dist LAD LIMA graft was visualized by angiography and is normal in caliber.  The graft exhibits no disease.  Intervention  No interventions have been documented.   STRESS TESTS  MYOCARDIAL PERFUSION IMAGING 05/09/2024  Interpretation  Summary   Findings are consistent with ischemia in the mid to basal anterolateral wall. The study is high risk.   No ST deviation was noted.   LV perfusion is abnormal. Defect 1: There is a medium defect with mild reduction in uptake present in the mid to basal anterolateral location(s) that is reversible. There is abnormal wall motion in the defect area. Consistent with ischemia.   Left ventricular function is abnormal. Nuclear stress EF: 37%. The left ventricular ejection fraction is moderately decreased (30-44%). End diastolic cavity size is moderately enlarged. End systolic cavity size is mildly enlarged.   CT images were obtained for attenuation correction and were examined for the presence of coronary calcium  when appropriate.   Coronary calcium  was present on the attenuation correction CT images. Severe coronary calcifications were present. Coronary calcifications were present in the left anterior descending artery, left circumflex artery and right coronary artery distribution(s).   ECHOCARDIOGRAM  ECHOCARDIOGRAM COMPLETE 03/28/2024  Narrative ECHOCARDIOGRAM REPORT    Patient Name:   RIAD WAGLEY [facility] Date of Exam: 03/28/2024 Medical Rec #:  [id number]       Height:       61.0 in Accession #:    [id number]      Weight:       380.0 lb Date of Birth:  1971/03/22       BSA:          2.481 m Patient Age:    52 years        BP:           133/72 mmHg Patient  Gender: M               HR:           85 bpm. Exam Location:  Inpatient  Procedure: 2D Echo, Cardiac Doppler, Color Doppler and Intracardiac Opacification Agent (Both Spectral and Color Flow Doppler were utilized during procedure).  Indications:    Chest Pain R07.9  History:        Patient has prior history of Echocardiogram examinations, most recent 04/27/2023. CHF, CAD, COPD and CKD, stage 3, Arrythmias:Atrial Flutter, Signs/Symptoms:Hypotension and Dyspnea; Risk Factors:Hypertension, Diabetes, Sleep Apnea and Current Smoker.  Sonographer:    Thea Norlander RCS Referring Phys: [id number] CAROLE N HALL   Sonographer Comments: Technically difficult study due to poor echo windows, suboptimal parasternal window, suboptimal apical window, no subcostal window and patient is obese. IMPRESSIONS   1. Left ventricular ejection fraction, by estimation, is 55 to 60%. The left ventricle has normal function. The left ventricle has no regional wall motion abnormalities. There is mild concentric left ventricular hypertrophy. Left ventricular diastolic parameters were normal. 2. Right ventricular systolic function is normal. The right ventricular size is not well visualized. 3. The mitral valve was not well visualized. Trivial mitral valve regurgitation. No evidence of mitral stenosis. 4. Valve appears functionally bicuspid, with fused left and right coronary cusp. The aortic valve is abnormal. Aortic valve regurgitation is not visualized. Aortic valve sclerosis/calcification is present, without any evidence of aortic stenosis. 5. Aortic dilatation noted. There is mild dilatation of the ascending aorta, measuring 41 mm.  Comparison(s): No significant change from prior study.  Conclusion(s)/Recommendation(s): Very limited windows/technically challening images, even with use of echo contrast. Grossly preserved LV function, no severe wall motion abnormalities noted, but reduced sensitivity for detection  of focal wall motion abnormalities based on images.  FINDINGS Left Ventricle: Left ventricular ejection fraction, by estimation, is 55 to 60%. The left ventricle  has normal function. The left ventricle has no regional wall motion abnormalities. Definity  contrast agent was given IV to delineate the left ventricular endocardial borders. The left ventricular internal cavity size was normal in size. There is mild concentric left ventricular hypertrophy. Left ventricular diastolic parameters were normal.  Right Ventricle: The right ventricular size is not well visualized. Right vetricular wall thickness was not well visualized. Right ventricular systolic function is normal.  Left Atrium: Left atrial size was normal in size.  Right Atrium: Right atrial size was not well visualized.  Pericardium: There is no evidence of pericardial effusion.  Mitral Valve: The mitral valve was not well visualized. Trivial mitral valve regurgitation. No evidence of mitral valve stenosis.  Tricuspid Valve: The tricuspid valve is not well visualized. Tricuspid valve regurgitation is trivial. No evidence of tricuspid stenosis.  Aortic Valve: Valve appears functionally bicuspid, with fused left and right coronary cusp. The aortic valve is abnormal. Aortic valve regurgitation is not visualized. Aortic valve sclerosis/calcification is present, without any evidence of aortic stenosis. Aortic valve peak gradient measures 4.3 mmHg.  Pulmonic Valve: The pulmonic valve was not well visualized. Pulmonic valve regurgitation is not visualized. No evidence of pulmonic stenosis.  Aorta: Aortic dilatation noted. There is mild dilatation of the ascending aorta, measuring 41 mm.  Venous: The inferior vena cava was not well visualized.  IAS/Shunts: The interatrial septum was not well visualized.   LEFT VENTRICLE PLAX 2D LVIDd:         4.10 cm   Diastology LVIDs:         2.70 cm   LV e' medial:    9.48 cm/s LV PW:         1.50  cm   LV E/e' medial:  7.2 LV IVS:        1.20 cm   LV e' lateral:   8.55 cm/s LVOT diam:     2.50 cm   LV E/e' lateral: 8.0 LV SV:         82 LV SV Index:   33 LVOT Area:     4.91 cm   RIGHT VENTRICLE RV S prime:     10.70 cm/s  LEFT ATRIUM           Index LA diam:      3.60 cm 1.45 cm/m LA Vol (A2C): 43.3 ml 17.45 ml/m AORTIC VALVE AV Area (Vmax): 4.39 cm AV Vmax:        104.00 cm/s AV Peak Grad:   4.3 mmHg LVOT Vmax:      92.93 cm/s LVOT Vmean:     60.167 cm/s LVOT VTI:       0.167 m  AORTA Ao Root diam: 4.30 cm Ao Asc diam:  4.10 cm  MITRAL VALVE MV Area (PHT): 2.99 cm     SHUNTS MV Decel Time: 254 msec     Systemic VTI:  0.17 m MV E velocity: 68.40 cm/s   Systemic Diam: 2.50 cm MV A velocity: 103.00 cm/s MV E/A ratio:  0.66  Shelda Bruckner MD Electronically signed by Shelda Bruckner MD Signature Date/Time: 03/28/2024/7:51:44 PM    Final   TEE  ECHO INTRAOPERATIVE TEE 07/24/2020  Narrative *INTRAOPERATIVE TRANSESOPHAGEAL REPORT *    Patient Name:   JAS BETTEN [facility] Date of Exam: 07/24/2020 Medical Rec #:  [id number]       Height:       77.0 in Accession #:    [id number]      Weight:  368.6 lb Date of Birth:  08-10-1971       BSA:          2.90 m Patient Age:    49 years        BP:           117/91 mmHg Patient Gender: M               HR:           67 bpm. Exam Location:  Anesthesiology  Transesophogeal exam was perform intraoperatively during surgical procedure. Patient was closely monitored under general anesthesia during the entirety of examination.  Indications:     Coronary artery disease Sonographer:     Annabella Fell RDCS Performing Phys: [id number] LINNIE KIDD LIGHTFOOT Diagnosing Phys: Allena Seip MD  POST-OP IMPRESSIONS - Left Ventricle: Post Bypass: The patient came off bypass on the initial attempt. There did not appear to be any new findings from preop exam. Left ventricular contraction did improve with volume  replacement. The TEE that had been placed uneventfully after induction was removed without difficulty at the end of the case. The patient was later taken to the SICU in stable conditon.  PRE-OP FINDINGS Left Ventricle: The left ventricle has low normal systolic function, with an ejection fraction of 50-55%.  Right Ventricle: The right ventricle has normal systolic function.  Left Atrium: The left atrial appendage is well visualized and there is no evidence of thrombus present.   Mitral Valve: The mitral valve is normal in structure. Mitral valve regurgitation is trivial by color flow Doppler.  Tricuspid Valve: The tricuspid valve was normal in structure.  Aortic Valve: The aortic valve is tricuspid Aortic valve regurgitation was not visualized by color flow Doppler.  Pulmonic Valve: The pulmonic valve was normal in structure.    Aorta: The ascending aorta is normal in size and structure.   Allena Seip MD Electronically signed by Allena Seip MD Signature Date/Time: 07/24/2020/6:13:18 PM   Final        ______________________________________________________________________________________________     Recent Labs: 01/09/2024: ALT 21 03/27/2024: B Natriuretic Peptide 90.4 04/01/2024: Magnesium  2.0 04/27/2024: NT-Pro BNP 895 07/17/2024: BUN 35; Creatinine, Ser 2.59; Hemoglobin 13.4; Platelets 259; Potassium 4.5; Sodium 138  Recent Lipid Panel    Component Value Date/Time   CHOL 136 05/02/2023 0128   TRIG 89 05/02/2023 0128   HDL 40 (L) 05/02/2023 0128   CHOLHDL 3.4 05/02/2023 0128   VLDL 18 05/02/2023 0128   LDLCALC 78 05/02/2023 0128   LDLDIRECT 120.0 05/28/2015 0951    History of Present Illness    53 year old male with the above past medical history including CAD s/p DES-LCx in 2017, s/p CABG x 4 (LIMA-LAD, SVG-PDA, SVG-OM1, SVG-D1) in 2021, aortic atherosclerosis, chronic HFpEF, PE, bicuspid aortic valve, mild dilation of the ascending aorta, PAD s/p bilateral  BKA, hypertension, hyperlipidemia, type 2 diabetes, CKD, COPD, OSA, and GERD who presents for follow-up related to CAD.  He has a history of CAD with prior stenting to his LCx in 2017.  He ultimately underwent CABG x 4 in 2021 (LIMA-LAD, SVG-PDA, SVG-OM1, SVG-D1).  Cardiac catheterization in 07/2021 revealed patent grafts.  Most recent cardiac catheterization in 05/2022 revealed distal left main 80, mid LAD 50, D1 40, D3 60 LCx 100, proximal RCA 80, mid stent patent with 30 ISR; LIMA-LAD, SVG-Dx SVG-OM, SVG-PDA patent. He was admitted in 03/2024 with acute CHF c/b AKI on chronic kidney disease. Pt was last seen by Jackee Alberts, NP in 04/2024  for chest pain. SPECT MPI in 04/2024 was high risk with anterolateral ischemia.  Results were reviewed with Dr. Jeffrie. Ranolazine  was increased with plan to proceed with cardiac catheterization should he have progressive symptoms.  He has a history of PAD s/p bilateral BKA.  Also with history of functional bicuspid aortic valve.  Most recent echocardiogram and 03/2024 showed EF 55-60, no RWMA, mild LVH, NL RVSF, trivial MR, BAV, AV sclerosis, ascending aorta 41 mm.  He presents today for follow-up.  Since his last visit   { Preoperative cardiovascular examination The patient needs to undergo eye surgery.  His wife tells me that this is to treat macular degeneration.  There is concerned that he will lose his eyesight.  He has a very complicated cardiac history.  The eye procedure is to be done under conscious sedation.  The patient is high risk for any procedure, regardless of his clinical condition.  I discussed his case today with Dr. Jeffrie (attending MD).  Given the low risk nature of his eye procedure, we agreed that the patient could proceed with his eye procedure at increased but acceptable risk.  From a cardiovascular standpoint, if it is needed, he can hold aspirin  and clopidogrel  (Plavix ) for 5 days.  This should be resumed postoperatively as soon as it is felt to be  safe.   (HFpEF) heart failure with preserved ejection fraction (HCC) He is fairly sedentary.  His diet also seems to be rich in salt.  He drinks electrolyte water which has sodium in it.  I have asked him to reduce/eliminate this.  He did not have any improvement with changing furosemide  to torsemide .  Actually, he feels his volume has worsened.  Therefore, I will discontinue torsemide .  Resume furosemide  120 mg in the morning, 80 mg in the afternoon.  Obtain follow-up BMET in 1 week.  Obtain echocardiogram as previously recommended.  Of note, this does not need to be done prior to his eye surgery.  Follow-up in June as planned with Jackee.   CAD (coronary artery disease) History of prior PCI in 2017 and CABG in 2021.  Cardiac catheterization in 2022 and 2023 demonstrated patent bypass wraps.  He has chronic chest discomfort.  His anginal symptoms have worsened over the past several weeks.  I suspect that this is all related to volume excess.  As noted, I reviewed his case with Dr. Jeffrie.  We do not feel that he needs to undergo repeat cardiac catheterization given his prior results less than 1 year ago.  Given his size, a nuclear stress test will not likely provide adequate results.  He was unable to tolerate Imdur  in the past.  Continue Ranexa  500 mg twice daily, amlodipine  10 mg daily, aspirin  81 mg daily, Lipitor  80 mg daily, carvedilol  12.5 mg twice daily, Plavix  75 mg daily.  Proceed with adjustments in furosemide  as noted.  If his angina does not improve with diuresis, consider increasing dose of Ranexa .  Keep follow-up in June as planned.   Aneurysm of ascending aorta (HCC) 43 mm was on echocardiogram in 2023.  Repeat echocardiogram pending.   Essential hypertension Blood pressure is well-controlled.  Continue amlodipine  10 mg daily, Coreg  12.5 mg twice daily.   Bicuspid aortic valve He has an aortic stenosis murmur on exam.  He did not have significant aortic stenosis by echocardiogram in  2023.  Therefore, it is not likely that he has significant aortic stenosis at this time.  However, he does need a follow-up echocardiogram  to document this.  We will try to reschedule this for him.  He notes transportation issues.  It has been difficult to get here for his echocardiogram.  He also is unable to see Dr. Raford at the [facility] location.  He requests transfer to [facility].  I have reviewed this with Dr. Jeffrie.  We will try to transfer him to [facility] to be followed by Dr. Jeffrie.  Home Medications    Current Outpatient Medications  Medication Sig Dispense Refill   acetaminophen  (TYLENOL ) 500 MG tablet Take 1,000 mg by mouth 3 (three) times daily as needed for headache.     albuterol  (VENTOLIN  HFA) 108 (90 Base) MCG/ACT inhaler Inhale 2 puffs into the lungs every 6 (six) hours as needed for shortness of breath.     ALPRAZolam  (XANAX ) 1 MG tablet Take 1 tablet (1 mg total) by mouth 2 (two) times daily as needed. (Patient taking differently: Take 1 mg by mouth at bedtime. May take an additional tablet twice a day as needed for anxiety) 60 tablet 0   ALPRAZolam  (XANAX ) 1 MG tablet Take 1-2 tablets (1-2 mg total) by mouth 2 (two) times daily as needed. 60 tablet 0   ALPRAZolam  (XANAX ) 1 MG tablet Take 1-2 tablets (1-2 mg total) by mouth 2 (two) times daily as needed. 60 tablet 0   alprazolam  (XANAX ) 2 MG tablet Take 1 tablet (2 mg total) by mouth 2 (two) times daily. 60 tablet 0   aspirin  EC 81 MG tablet Take 81 mg by mouth in the morning. Swallow whole.     atorvastatin  (LIPITOR ) 80 MG tablet Take 1 tablet (80 mg total) by mouth daily. 90 tablet 3   Blood Pressure Monitoring (OMRON 3 SERIES BP MONITOR) DEVI Ues as directed. 1 each 0   buPROPion  (WELLBUTRIN  XL) 150 MG 24 hr tablet Take 150 mg by mouth in the morning and at bedtime.     cephALEXin  (KEFLEX ) 500 MG capsule Take 1 capsule (500 mg total) by mouth 2 (two) times daily for 10 days. 20 capsule 1   clopidogrel  (PLAVIX )  75 MG tablet Take 1 tablet (75 mg total) by mouth daily. 90 tablet 1   Continuous Glucose Sensor (FREESTYLE LIBRE 3 PLUS SENSOR) MISC Use as directed to monitor blood sugar. Change every 15 days. 6 each 3   escitalopram  (LEXAPRO ) 10 MG tablet Take 10 mg by mouth daily.     fluticasone  (FLONASE ) 50 MCG/ACT nasal spray Place 2 sprays into both nostrils daily.     fluticasone  (FLONASE ) 50 MCG/ACT nasal spray Place 1 spray into both nostrils 2 (two) times daily. 16 g 5   furosemide  (LASIX ) 80 MG tablet Take 1 tablet (80 mg total) by mouth 2 (two) times daily. 180 tablet 3   insulin  aspart (NOVOLOG ) 100 UNIT/ML FlexPen Inject 10 Units into the skin 3 (three) times daily. 27 mL 3   insulin  aspart (NOVOLOG ) 100 UNIT/ML injection Inject 8 Units into the skin 3 (three) times daily with meals. Hold if eats less than 50% of meals (Patient taking differently: Inject 10 Units into the skin with breakfast, with lunch, and with evening meal. Hold if eats less than 50% of meals) 15 mL 0   insulin  glargine (LANTUS  SOLOSTAR) 100 UNIT/ML Solostar Pen Inject 38 Units into the skin 2 (two) times daily. (Patient taking differently: Inject 38-45 Units into the skin 2 (two) times daily.) 15 mL 1   insulin  lispro (HUMALOG  KWIKPEN) 100 UNIT/ML KwikPen Inject 12 Units  into the skin before a meal. May take up to 45 units per day. 18 mL 11   insulin  lispro (HUMALOG  KWIKPEN) 100 UNIT/ML KwikPen Inject 12 Units into the skin 3 (three) times daily before meals. Can take up to 45 units per day. 60 mL 3   ketotifen  (ZADITOR ) 0.035 % ophthalmic solution Place 1 drop into the left eye 2 (two) times daily. 3 mL 0   lidocaine  (LIDODERM ) 5 % Place 1 patch to each stump once daily, remove after 12 hours. 60 patch 11   loratadine  (CLARITIN ) 10 MG tablet Take 10 mg by mouth daily.     loratadine  (CLARITIN ) 10 MG tablet Take 1 tablet (10 mg total) by mouth daily. 90 tablet 3   losartan -hydrochlorothiazide  (HYZAAR ) 100-25 MG tablet Take 1  tablet by mouth daily. 90 tablet 0   midodrine  (PROAMATINE ) 5 MG tablet Take 1 tablet (5 mg total) by mouth 2 (two) times daily with a meal. 180 tablet 1   naloxone  (NARCAN ) nasal spray 4 mg/0.1 mL Place 4 mg into the nose as needed (overdose).     naloxone  (NARCAN ) nasal spray 4 mg/0.1 mL Place 1 spray into the nose as needed and seek medical follow-up. 2 each 11   nitroGLYCERIN  (NITROSTAT ) 0.4 MG SL tablet Place 1 tablet (0.4 mg total) under the tongue every 5 (five) minutes as needed. 25 tablet 3   oxyCODONE  (ROXICODONE ) 15 MG immediate release tablet Take 1 tablet (15 mg total) by mouth every 4 (four) hours for 30 days 150 tablet 0   oxyCODONE  (ROXICODONE ) 15 MG immediate release tablet Take 1 tablet (15 mg total) by mouth every 4 (four) hours. MUST LAST 20 DAYS TILL NEXT APPOINTMENT. 150 tablet 0   oxyCODONE  (ROXICODONE ) 15 MG immediate release tablet Take 1 tablet (15 mg total) by mouth every 4 (four) hours. 150 tablet 0   oxyCODONE  (ROXICODONE ) 15 MG immediate release tablet Take 1 tablet (15 mg total) by mouth every 4 (four) hours. Must last 30 days. 150 tablet 0   oxyCODONE  (ROXICODONE ) 15 MG immediate release tablet Take 1 tablet (15 mg total) by mouth 4-5 times daily. 150 tablet 0   oxyCODONE  (ROXICODONE ) 15 MG immediate release tablet Take 1 tablet by mouth 4 - 5 times daily as needed 150 tablet 0   oxyCODONE  (ROXICODONE ) 15 MG immediate release tablet Take 1 tablet (15 mg total) by mouth 4 (four)-5 (five) times daily. 150 tablet 0   pantoprazole  (PROTONIX ) 40 MG tablet Take 1 tablet (40 mg total) by mouth daily. 90 tablet 3   pantoprazole  (PROTONIX ) 40 MG tablet Take 1 tablet (40 mg total) by mouth 2 (two) times daily. 180 tablet 3   prednisoLONE  acetate (PRED FORTE ) 1 % ophthalmic suspension Place 1 drop into the left eye 4 (four) times daily.     pregabalin  (LYRICA ) 100 MG capsule Take 1 capsule (100 mg total) by mouth 2 (two) times daily.     promethazine  (PHENERGAN ) 25 MG tablet Take  25 mg by mouth every 4 (four) hours as needed for nausea or vomiting.     ranolazine  (RANEXA ) 1000 MG SR tablet TAKE 1 TABLET(1000 MG) BY MOUTH TWICE DAILY 180 tablet 1   silver  sulfADIAZINE  (SILVADENE ) 1 % cream Apply 1 Application topically daily until healed. 100 g 11   tiZANidine  (ZANAFLEX ) 4 MG tablet Take 4 mg by mouth 3 (three) times daily.     VASCEPA  1 g capsule TAKE TWO CAPSULES (2 G TOTAL) BY MOUTH TWICE  DAILY (APPOINTMENT OVERDUE) 120 capsule 2   No current facility-administered medications for this visit.     Review of Systems    ***.  All other systems reviewed and are otherwise negative except as noted above.    Physical Exam    VS:  There were no vitals taken for this visit. , BMI There is no height or weight on file to calculate BMI.     GEN: Well nourished, well developed, in no acute distress. HEENT: normal. Neck: Supple, no JVD, carotid bruits, or masses. Cardiac: RRR, no murmurs, rubs, or gallops. No clubbing, cyanosis, edema.  Radials/DP/PT 2+ and equal bilaterally.  Respiratory:  Respirations regular and unlabored, clear to auscultation bilaterally. GI: Soft, nontender, nondistended, BS + x 4. MS: no deformity or atrophy. Skin: warm and dry, no rash. Neuro:  Strength and sensation are intact. Psych: Normal affect.  Accessory Clinical Findings    ECG personally reviewed by me today -    - no acute changes.   Lab Results  Component Value Date   WBC 10.7 (H) 07/17/2024   HGB 13.4 07/17/2024   HCT 41.0 07/17/2024   MCV 84.5 07/17/2024   PLT 259 07/17/2024   Lab Results  Component Value Date   CREATININE 2.59 (H) 07/17/2024   BUN 35 (H) 07/17/2024   NA 138 07/17/2024   K 4.5 07/17/2024   CL 104 07/17/2024   CO2 24 07/17/2024   Lab Results  Component Value Date   ALT 21 01/09/2024   AST 17 01/09/2024   ALKPHOS 153 (H) 01/09/2024   BILITOT 0.5 01/09/2024   Lab Results  Component Value Date   CHOL 136 05/02/2023   HDL 40 (L) 05/02/2023    LDLCALC 78 05/02/2023   LDLDIRECT 120.0 05/28/2015   TRIG 89 05/02/2023   CHOLHDL 3.4 05/02/2023    Lab Results  Component Value Date   HGBA1C 8.1 (H) 01/07/2024    Assessment & Plan    1.  ***  No BP recorded.  {Refresh Note OR Click here to enter BP  :1}***   Damien JAYSON Braver, NP 10/31/2024, 9:24 AM

## 2024-11-01 ENCOUNTER — Other Ambulatory Visit (HOSPITAL_COMMUNITY): Payer: Self-pay

## 2024-11-06 ENCOUNTER — Other Ambulatory Visit: Payer: Self-pay

## 2024-11-06 ENCOUNTER — Other Ambulatory Visit (HOSPITAL_COMMUNITY): Payer: Self-pay

## 2024-11-07 ENCOUNTER — Other Ambulatory Visit (HOSPITAL_COMMUNITY): Payer: Self-pay

## 2024-11-07 MED ORDER — BUPROPION HCL ER (XL) 300 MG PO TB24
300.0000 mg | ORAL_TABLET | Freq: Every morning | ORAL | 3 refills | Status: AC
  Filled 2024-11-07: qty 30, 30d supply, fill #0
  Filled 2024-12-04: qty 30, 30d supply, fill #1
  Filled 2025-01-08: qty 30, 30d supply, fill #2

## 2024-11-07 MED ORDER — PREGABALIN 150 MG PO CAPS
300.0000 mg | ORAL_CAPSULE | Freq: Two times a day (BID) | ORAL | 3 refills | Status: AC
  Filled 2024-11-07: qty 90, 30d supply, fill #0

## 2024-11-07 MED ORDER — ALPRAZOLAM 2 MG PO TABS
2.0000 mg | ORAL_TABLET | Freq: Two times a day (BID) | ORAL | 0 refills | Status: AC
Start: 2024-11-07 — End: ?
  Filled 2024-11-09: qty 60, 30d supply, fill #0

## 2024-11-07 MED ORDER — OXYCODONE HCL 15 MG PO TABS
15.0000 mg | ORAL_TABLET | Freq: Every day | ORAL | 0 refills | Status: AC
Start: 2024-11-07 — End: ?
  Filled 2024-11-09: qty 150, 30d supply, fill #0

## 2024-11-09 ENCOUNTER — Other Ambulatory Visit (HOSPITAL_COMMUNITY): Payer: Self-pay

## 2024-11-20 ENCOUNTER — Ambulatory Visit (INDEPENDENT_AMBULATORY_CARE_PROVIDER_SITE_OTHER): Admitting: Physician Assistant

## 2024-11-20 ENCOUNTER — Other Ambulatory Visit (HOSPITAL_COMMUNITY): Payer: Self-pay

## 2024-11-20 DIAGNOSIS — Z89511 Acquired absence of right leg below knee: Secondary | ICD-10-CM | POA: Diagnosis not present

## 2024-11-20 DIAGNOSIS — L89899 Pressure ulcer of other site, unspecified stage: Secondary | ICD-10-CM | POA: Diagnosis not present

## 2024-11-20 DIAGNOSIS — Z89512 Acquired absence of left leg below knee: Secondary | ICD-10-CM

## 2024-11-20 NOTE — Progress Notes (Unsigned)
 Office Visit Note   Patient: Joseph [name]           Date of Birth: [birth year]           MRN: [id number] Visit Date: 11/20/2024              Requested by: Alec House, MD [address] [place],  [name] [address] PCP: Alec House, MD  No chief complaint on file.     HPI: 53 y/o male with bilateral BKA. He fell and landed on his stumps. The right one has healed and the left is in the process of healing. He has been doing Vashe dressing changes.   He was planning on returning to [facility] for prosthesis evaluation and to get new stump shrinker socks.    Assessment & Plan: Visit Diagnoses: No diagnosis found.  Plan: ***  Follow-Up Instructions: No follow-ups on file.   Ortho Exam  Patient is alert, oriented, no adenopathy, well-dressed, normal affect, normal respiratory effort. ***    Imaging: No results found. No images are attached to the encounter.  Labs: Lab Results  Component Value Date   HGBA1C 8.1 (H) 01/07/2024   HGBA1C 8.0 (H) 11/27/2023   HGBA1C 7.4 (H) 04/26/2023   ESRSEDRATE 62 (H) 07/01/2022   ESRSEDRATE 45 (H) 11/18/2021   ESRSEDRATE 41 (H) 09/16/2017   CRP 12.0 (H) 07/01/2022   CRP 12.1 (H) 11/18/2021   CRP 8.9 (H) 09/16/2017   REPTSTATUS 04/02/2024 FINAL 03/31/2024   CULT >=100,000 COLONIES/mL PROTEUS MIRABILIS (A) 03/31/2024   LABORGA PROTEUS MIRABILIS (A) 03/31/2024     Lab Results  Component Value Date   ALBUMIN  2.2 (L) 04/06/2024   ALBUMIN  2.4 (L) 04/05/2024   ALBUMIN  2.2 (L) 04/04/2024   PREALBUMIN 16.5 (L) 01/07/2022    Lab Results  Component Value Date   MG 2.0 04/01/2024   MG 1.5 (L) 03/31/2024   MG 1.3 (L) 03/28/2024   Lab Results  Component Value Date   VD25OH 9.40 (L) 01/07/2022    Lab Results  Component Value Date   PREALBUMIN 16.5 (L) 01/07/2022      Latest Ref Rng & Units 07/17/2024    4:09 PM 04/27/2024   10:51 AM 04/04/2024    8:15 AM  CBC EXTENDED  WBC 4.0 - 10.5 K/uL 10.7  7.4  7.5   RBC  4.22 - 5.81 MIL/uL 4.85  4.66  4.72   Hemoglobin 13.0 - 17.0 g/dL 86.5  87.2  87.1   HCT 39.0 - 52.0 % 41.0  39.0  38.8   Platelets 150 - 400 K/uL 259  242  253      There is no height or weight on file to calculate BMI.  Orders:  No orders of the defined types were placed in this encounter.  No orders of the defined types were placed in this encounter.    Procedures: No procedures performed  Clinical Data: No additional findings.  ROS:  All other systems negative, except as noted in the HPI. Review of Systems  Objective: Vital Signs: There were no vitals taken for this visit.  Specialty Comments:  No specialty comments available.  PMFS History: Patient Active Problem List   Diagnosis Date Noted   Pressure injury 04/05/2024   UTI (urinary tract infection) 04/04/2024   Peripheral edema 03/31/2024   Acute kidney failure 01/09/2024   Acute urinary retention 01/08/2024   S/P bilateral BKA (below knee amputation) (HCC) 01/07/2024   Morbid obesity with BMI of 70  and over, adult [facility]) 11/28/2023   Aortic atherosclerosis 11/28/2023   CKD (chronic kidney disease), stage III (HCC) 11/28/2023   Weakness 04/28/2023   Nephrosis 04/27/2023   AKI (acute kidney injury) 04/26/2023   OSA (obstructive sleep apnea) 04/26/2023   Hx of iron deficiency anemia 07/07/2022   Below-knee amputation of left lower extremity (HCC)    Bipolar disorder with depression (HCC)    Dyspnea 11/18/2021   Bicuspid aortic valve 09/14/2021   Aneurysm of ascending aorta 09/14/2021   DM (diabetes mellitus) with peripheral vascular complication (HCC) 08/20/2021   Essential hypertension 07/17/2021   Cellulitis of left lower leg 04/11/2021   Acute on chronic diastolic CHF (congestive heart failure) (HCC) 07/22/2020   Acute kidney injury superimposed on stage 3b chronic kidney disease (HCC) 07/18/2020   Leukocytosis 05/31/2019   Smoker 03/08/2019   Chronic back pain 02/16/2019   BMI 70 and over, adult  (HCC) 02/16/2019   Peripheral nerve disease 02/16/2019   COPD (chronic obstructive pulmonary disease) (HCC) 01/22/2019   Anxiety and depression 03/26/2018   Recurrent chest pain 07/29/2016   Hyperglycemia    Insulin  dependent type 2 diabetes mellitus (HCC) 04/29/2015   History of pulmonary embolus (PE) 04/27/2013   CAD (coronary artery disease) 09/10/2008   Past Medical History:  Diagnosis Date   Aneurysm of ascending aorta 09/14/2021   Anxiety    Aortic atherosclerosis 11/28/2023   Arthritis    knees, shoulders, hips, ankles (07/29/2016)   Asthma    Bicuspid aortic valve 09/14/2021   BMI 70 and over, adult (HCC) 02/16/2019   CAD (coronary artery disease)    a. 2017: s/p BMS to distal Cx; b. LHC 05/30/2019: 80% mid, distal RCA s/p DES, 30% narrowing of d LM, widely patent LAD w/ luminal irregularities, widely patent stent in dCX  w/ 90+% stenosis distal to stent beofre small trifurcating obtuse marginal (potentially area of restenosis)   Chronic bronchitis (HCC)    Chronic ulcer of great toe of right foot (HCC) 09/09/2017   Chronic ulcer of right great toe (HCC) 09/19/2017   COPD (chronic obstructive pulmonary disease) (HCC)    Coronary arteriosclerosis    Deep venous thrombosis (HCC) 02/16/2019   Depression    Diabetes mellitus (HCC)    DVT (deep venous thrombosis) (HCC)    GERD (gastroesophageal reflux disease)    Hyperlipidemia    Hypertension    Migraine    Morbid obesity (HCC)    Morbid obesity with BMI of 70 and over, adult (HCC) 11/28/2023   Myocardial infarction (HCC) 1996   light one   Non-ST elevation (NSTEMI) myocardial infarction (HCC) 07/22/2020   PE (pulmonary embolism) 04/2013   On chronic Xarelto    Peripheral nerve disease    Pneumonia several times   Sleep apnea    Type II diabetes mellitus (HCC)     Family History  Problem Relation Age of Onset   Hypertension Brother    Hypertension Father    Diabetes Other    Hyperlipidemia Other     Past  Surgical History:  Procedure Laterality Date   ABDOMINAL AORTOGRAM W/LOWER EXTREMITY N/A 10/10/2020   Procedure: ABDOMINAL AORTOGRAM W/LOWER EXTREMITY;  Surgeon: Harvey Carlin BRAVO, MD;  [facility]: [facility] INVASIVE [facility];  Service: Cardiovascular;  Laterality: N/A;   AMPUTATION Right 09/21/2017   Procedure: RIGHT GREAT TOE AMPUTATION, POSSIBLE VAC;  Surgeon: Jerri Kay HERO, MD;  Location: [facility] OR;  Service: Orthopedics;  Laterality: Right;   AMPUTATION Left 08/13/2020   Procedure: LEFT FOOT  4TH RAY AMPUTATION;  Surgeon: Harden Jerona GAILS, MD;  Location: [facility] OR;  Service: Orthopedics;  Laterality: Left;   AMPUTATION Left 11/20/2021   Procedure: LEFT TRANSMETATARSAL AMPUTATION;  Surgeon: Harden Jerona GAILS, MD;  Location: [facility] OR;  Service: Orthopedics;  Laterality: Left;   AMPUTATION Left 01/08/2022   Procedure: LEFT BELOW KNEE AMPUTATION;  Surgeon: Harden Jerona GAILS, MD;  Location[facility] OR;  Service: Orthopedics;  Laterality: Left;   AMPUTATION Right 07/07/2022   Procedure: RIGHT BELOW KNEE AMPUTATION;  Surgeon: Harden Jerona GAILS, MD;  Location: [facility] OR;  Service: Orthopedics;  Laterality: Right;   AORTOGRAM Bilateral 03/13/2021   Procedure: ABDOMINAL AORTOGRAM WITH Left LOWER EXTREMITY RUNOFF;  Surgeon: Magda Debby SAILOR, MD;  Location: [facility] OR;  Service: Vascular;  Laterality: Bilateral;   APPLICATION OF WOUND VAC  01/08/2022   Procedure: APPLICATION OF WOUND VAC;  Surgeon: Harden Jerona GAILS, MD;  Location: [facility] OR;  Service: Orthopedics;;   APPLICATION OF WOUND VAC Right 07/07/2022   Procedure: APPLICATION OF WOUND VAC;  Surgeon: Harden Jerona GAILS, MD;  Location: [facility] OR;  Service: Orthopedics;  Laterality: Right;   CARDIAC CATHETERIZATION  2006   CARDIAC CATHETERIZATION  1996   @ [facility]; when I had my heart attack   CARDIAC CATHETERIZATION N/A 07/29/2016   Procedure: Left Heart Cath and Coronary Angiography;  Surgeon: Dorn JINNY Lesches, MD;  Location: [facility] INVASIVE CV LAB;  Service: Cardiovascular;  Laterality: N/A;   CARDIAC  CATHETERIZATION N/A 07/29/2016   Procedure: Coronary Stent Intervention;  Surgeon: Dorn JINNY Lesches, MD;  Location: [facility];  Service: Cardiovascular;  Laterality: N/A;   CARPAL TUNNEL RELEASE Bilateral    CORONARY ANGIOPLASTY WITH STENT PLACEMENT  07/29/2016   CORONARY ARTERY BYPASS GRAFT N/A 07/24/2020   Procedure: CORONARY ARTERY BYPASS GRAFTING (CABG), ON PUMP, TIMES FOUR, USING LEFT INTERNAL MAMMARY ARTERY AND ENDOSCOPICALLY HARVESTED RIGHT GREATER SAPHENOUS VEIN;  Surgeon: Shyrl Linnie KIDD, MD;  Location: [facility] OR;  Service: Open Heart Surgery;  Laterality: N/A;  FLOW TAC   CORONARY PRESSURE/FFR STUDY N/A 07/22/2020   Procedure: INTRAVASCULAR PRESSURE WIRE/FFR STUDY;  Surgeon: Anner Alm ORN, MD;  Location: [facility] INVASIVE CV LAB;  Service: Cardiovascular;  Laterality: N/A;   CORONARY STENT INTERVENTION N/A 05/30/2019   Procedure: CORONARY STENT INTERVENTION;  Surgeon: Claudene Victory ORN, MD;  Location: [facility];  Service: Cardiovascular;  Laterality: N/A;   CORONARY STENT INTERVENTION N/A 11/02/2019   Procedure: CORONARY STENT INTERVENTION;  Surgeon: Darron Deatrice LABOR, MD;  Location: [facility];  Service: Cardiovascular;  Laterality: N/A;   ESOPHAGOGASTRODUODENOSCOPY N/A 09/22/2017   Procedure: ESOPHAGOGASTRODUODENOSCOPY (EGD);  Surgeon: Aneita Gwendlyn DASEN, MD;  Location: [facility] ENDOSCOPY;  Service: Endoscopy;  Laterality: N/A;   KNEE ARTHROSCOPY Bilateral    2 on left; 1 on the right   LEFT HEART CATH AND CORONARY ANGIOGRAPHY N/A 11/02/2019   Procedure: LEFT HEART CATH AND CORONARY ANGIOGRAPHY;  Surgeon: Darron Deatrice LABOR, MD;  Location: [facility];  Service: Cardiovascular;  Laterality: N/A;   LEFT HEART CATH AND CORONARY ANGIOGRAPHY N/A 07/22/2020   Procedure: LEFT HEART CATH AND CORONARY ANGIOGRAPHY;  Surgeon: Anner Alm ORN, MD;  Location: [facility] INVASIVE CV LAB;  Service: Cardiovascular;  Laterality: N/A;   LEFT HEART CATH AND CORS/GRAFTS ANGIOGRAPHY N/A 08/17/2021    Procedure: LEFT HEART CATH AND CORS/GRAFTS ANGIOGRAPHY;  Surgeon: Mady Bruckner, MD;  Location: [facility];  Service: Cardiovascular;  Laterality: N/A;   PERIPHERAL VASCULAR INTERVENTION Left 10/11/2020  popliteal and SFA stent placement    PERIPHERAL VASCULAR INTERVENTION Left 10/10/2020   Procedure: PERIPHERAL VASCULAR INTERVENTION;  Surgeon: Harvey Carlin BRAVO, MD;  Location: [facility] INVASIVE CV LAB;  Service: Cardiovascular;  Laterality: Left;   RIGHT/LEFT HEART CATH AND CORONARY ANGIOGRAPHY N/A 05/30/2019   Procedure: RIGHT/LEFT HEART CATH AND CORONARY ANGIOGRAPHY;  Surgeon: Claudene Victory ORN, MD;  Location: [facility];  Service: Cardiovascular;  Laterality: N/A;   RIGHT/LEFT HEART CATH AND CORONARY/GRAFT ANGIOGRAPHY N/A 06/17/2022   Procedure: RIGHT/LEFT HEART CATH AND CORONARY/GRAFT ANGIOGRAPHY;  Surgeon: Burnard Debby LABOR, MD;  Location: [facility];  Service: Cardiovascular;  Laterality: N/A;   SHOULDER OPEN ROTATOR CUFF REPAIR Bilateral    TEE WITHOUT CARDIOVERSION N/A 07/24/2020   Procedure: TRANSESOPHAGEAL ECHOCARDIOGRAM (TEE);  Surgeon: Shyrl Linnie KIDD, MD;  Location: [facility] OR;  Service: Open Heart Surgery;  Laterality: N/A;   Social History   Occupational History   Occupation: disabled     Associate Professor: UNEMPLOYED  Tobacco Use   Smoking status: Former    Current packs/day: 0.00    Average packs/day: 1 pack/day for 34.0 years (34.0 ttl pk-yrs)    Types: Cigarettes    Start date: 07/12/1986    Quit date: 07/12/2020    Years since quitting: 4.3   Smokeless tobacco: Former    Types: Snuff, Chew  Vaping Use   Vaping status: Never Used  Substance and Sexual Activity   Alcohol use: Yes    Comment: RARE   Drug use: No   Sexual activity: Yes

## 2024-11-21 ENCOUNTER — Encounter: Payer: Self-pay | Admitting: Physician Assistant

## 2024-11-28 ENCOUNTER — Other Ambulatory Visit (HOSPITAL_COMMUNITY): Payer: Self-pay

## 2024-11-28 ENCOUNTER — Other Ambulatory Visit: Payer: Self-pay

## 2024-11-29 ENCOUNTER — Other Ambulatory Visit (HOSPITAL_COMMUNITY): Payer: Self-pay

## 2024-12-02 ENCOUNTER — Other Ambulatory Visit (HOSPITAL_COMMUNITY): Payer: Self-pay

## 2024-12-04 ENCOUNTER — Other Ambulatory Visit (HOSPITAL_COMMUNITY): Payer: Self-pay

## 2024-12-04 ENCOUNTER — Ambulatory Visit: Admitting: Physician Assistant

## 2024-12-04 ENCOUNTER — Encounter: Payer: Self-pay | Admitting: Physician Assistant

## 2024-12-04 DIAGNOSIS — Z89511 Acquired absence of right leg below knee: Secondary | ICD-10-CM

## 2024-12-04 DIAGNOSIS — L89899 Pressure ulcer of other site, unspecified stage: Secondary | ICD-10-CM

## 2024-12-04 MED ORDER — LOSARTAN POTASSIUM-HCTZ 100-25 MG PO TABS
1.0000 | ORAL_TABLET | Freq: Every day | ORAL | 0 refills | Status: AC
  Filled 2024-12-04: qty 90, 90d supply, fill #0

## 2024-12-04 NOTE — Progress Notes (Signed)
 Office Visit Note   Patient: Joseph [name]           Date of Birth: [birth year]           MRN: [id number] Visit Date: 12/04/2024              Requested by: Alec House, MD [address] [place],  [name] [address] PCP: Alec House, MD  Chief Complaint  Patient presents with   Left Leg - Wound Check      Hx left BKA        HPI: 53 y/o male with bilateral BKA. He fell and landed on his stumps. The right one has healed and the left is in the process of healing. He has been doing Vashe dressing changes. He crawls on his knees at time to get around his house and now has a new wound over the distal edge of the left patella.    He has a follow up appointment with [facility] today.    Assessment & Plan: Visit Diagnoses:  1. Pressure injury of skin of left lower leg   2. S/P bilateral BKA (below knee amputation) (HCC)     Plan: The wound size has not changed in a while.  Continue to wash with soap and water, clean wound beds with Vashe daily.  Apply Vive stump sock against the wound beds instead of wet to dry Vashe dressing changes.  Follow-Up Instructions: Return in about 3 weeks (around 12/25/2024).   Ortho Exam  Patient is alert, oriented, no adenopathy, well-dressed, normal affect, normal respiratory effort. The wounds have no edema or cellulitis.  The proximal new wound measures 1.3 cm x 1.8 cm, the proximal stump wound measures 1 cm x 1 cm and the distal wound measures 1.8 cm x 1.8 cm.  The wound beds are flat with 100% granulation after 4 x 4 cleaning with Vashe.  Stump skin is warm and appears viable without frank infection.         Imaging: No results found. No images are attached to the encounter.  Labs: Lab Results  Component Value Date   HGBA1C 8.1 (H) 01/07/2024   HGBA1C 8.0 (H) 11/27/2023   HGBA1C 7.4 (H) 04/26/2023   ESRSEDRATE 62 (H) 07/01/2022   ESRSEDRATE 45 (H) 11/18/2021   ESRSEDRATE 41 (H) 09/16/2017   CRP 12.0 (H) 07/01/2022   CRP  12.1 (H) 11/18/2021   CRP 8.9 (H) 09/16/2017   REPTSTATUS 04/02/2024 FINAL 03/31/2024   CULT >=100,000 COLONIES/mL PROTEUS MIRABILIS (A) 03/31/2024   LABORGA PROTEUS MIRABILIS (A) 03/31/2024     Lab Results  Component Value Date   ALBUMIN  2.2 (L) 04/06/2024   ALBUMIN  2.4 (L) 04/05/2024   ALBUMIN  2.2 (L) 04/04/2024   PREALBUMIN 16.5 (L) 01/07/2022    Lab Results  Component Value Date   MG 2.0 04/01/2024   MG 1.5 (L) 03/31/2024   MG 1.3 (L) 03/28/2024   Lab Results  Component Value Date   VD25OH 9.40 (L) 01/07/2022    Lab Results  Component Value Date   PREALBUMIN 16.5 (L) 01/07/2022      Latest Ref Rng & Units 07/17/2024    4:09 PM 04/27/2024   10:51 AM 04/04/2024    8:15 AM  CBC EXTENDED  WBC 4.0 - 10.5 K/uL 10.7  7.4  7.5   RBC 4.22 - 5.81 MIL/uL 4.85  4.66  4.72   Hemoglobin 13.0 - 17.0 g/dL 86.5  87.2  87.1   HCT 39.0 -  52.0 % 41.0  39.0  38.8   Platelets 150 - 400 K/uL 259  242  253      There is no height or weight on file to calculate BMI.  Orders:  No orders of the defined types were placed in this encounter.  No orders of the defined types were placed in this encounter.    Procedures: No procedures performed  Clinical Data: No additional findings.  ROS:  All other systems negative, except as noted in the HPI. Review of Systems  Objective: Vital Signs: There were no vitals taken for this visit.  Specialty Comments:  No specialty comments available.  PMFS History: Patient Active Problem List   Diagnosis Date Noted   Pressure injury 04/05/2024   UTI (urinary tract infection) 04/04/2024   Peripheral edema 03/31/2024   Acute kidney failure 01/09/2024   Acute urinary retention 01/08/2024   S/P bilateral BKA (below knee amputation) (HCC) 01/07/2024   Morbid obesity with BMI of 70 and over, adult (HCC) 11/28/2023   Aortic atherosclerosis 11/28/2023   CKD (chronic kidney disease), stage III (HCC) 11/28/2023   Weakness 04/28/2023    Nephrosis 04/27/2023   AKI (acute kidney injury) 04/26/2023   OSA (obstructive sleep apnea) 04/26/2023   Hx of iron deficiency anemia 07/07/2022   Below-knee amputation of left lower extremity (HCC)    Bipolar disorder with depression (HCC)    Dyspnea 11/18/2021   Bicuspid aortic valve 09/14/2021   Aneurysm of ascending aorta 09/14/2021   DM (diabetes mellitus) with peripheral vascular complication (HCC) 08/20/2021   Essential hypertension 07/17/2021   Cellulitis of left lower leg 04/11/2021   Acute on chronic diastolic CHF (congestive heart failure) (HCC) 07/22/2020   Acute kidney injury superimposed on stage 3b chronic kidney disease (HCC) 07/18/2020   Leukocytosis 05/31/2019   Smoker 03/08/2019   Chronic back pain 02/16/2019   BMI 70 and over, adult (HCC) 02/16/2019   Peripheral nerve disease 02/16/2019   COPD (chronic obstructive pulmonary disease) (HCC) 01/22/2019   Anxiety and depression 03/26/2018   Recurrent chest pain 07/29/2016   Hyperglycemia    Insulin  dependent type 2 diabetes mellitus (HCC) 04/29/2015   History of pulmonary embolus (PE) 04/27/2013   CAD (coronary artery disease) 09/10/2008   Past Medical History:  Diagnosis Date   Aneurysm of ascending aorta 09/14/2021   Anxiety    Aortic atherosclerosis 11/28/2023   Arthritis    knees, shoulders, hips, ankles (07/29/2016)   Asthma    Bicuspid aortic valve 09/14/2021   BMI 70 and over, adult (HCC) 02/16/2019   CAD (coronary artery disease)    a. 2017: s/p BMS to distal Cx; b. LHC 05/30/2019: 80% mid, distal RCA s/p DES, 30% narrowing of d LM, widely patent LAD w/ luminal irregularities, widely patent stent in dCX  w/ 90+% stenosis distal to stent beofre small trifurcating obtuse marginal (potentially area of restenosis)   Chronic bronchitis (HCC)    Chronic ulcer of great toe of right foot (HCC) 09/09/2017   Chronic ulcer of right great toe (HCC) 09/19/2017   COPD (chronic obstructive pulmonary disease) (HCC)     Coronary arteriosclerosis    Deep venous thrombosis (HCC) 02/16/2019   Depression    Diabetes mellitus (HCC)    DVT (deep venous thrombosis) (HCC)    GERD (gastroesophageal reflux disease)    Hyperlipidemia    Hypertension    Migraine    Morbid obesity (HCC)    Morbid obesity with BMI of 70 and over, adult (HCC)  11/28/2023   Myocardial infarction (HCC) 1996   light one   Non-ST elevation (NSTEMI) myocardial infarction (HCC) 07/22/2020   PE (pulmonary embolism) 04/2013   On chronic Xarelto    Peripheral nerve disease    Pneumonia several times   Sleep apnea    Type II diabetes mellitus (HCC)     Family History  Problem Relation Age of Onset   Hypertension Brother    Hypertension Father    Diabetes Other    Hyperlipidemia Other     Past Surgical History:  Procedure Laterality Date   ABDOMINAL AORTOGRAM W/LOWER EXTREMITY N/A 10/10/2020   Procedure: ABDOMINAL AORTOGRAM W/LOWER EXTREMITY;  Surgeon: Harvey Carlin BRAVO, MD;  Location: [facility];  Service: Cardiovascular;  Laterality: N/A;   AMPUTATION Right 09/21/2017   Procedure: RIGHT GREAT TOE AMPUTATION, POSSIBLE VAC;  Surgeon: Jerri Kay HERO, MD;  Location: [facility] OR;  Service: Orthopedics;  Laterality: Right;   AMPUTATION Left 08/13/2020   Procedure: LEFT FOOT 4TH RAY AMPUTATION;  Surgeon: Harden Jerona GAILS, MD;  Location: [facility] OR;  Service: Orthopedics;  Laterality: Left;   AMPUTATION Left 11/20/2021   Procedure: LEFT TRANSMETATARSAL AMPUTATION;  Surgeon: Harden Jerona GAILS, MD;  Location: [facility] OR;  Service: Orthopedics;  Laterality: Left;   AMPUTATION Left 01/08/2022   Procedure: LEFT BELOW KNEE AMPUTATION;  Surgeon: Harden Jerona GAILS, MD;  Location: [facility] OR;  Service: Orthopedics;  Laterality: Left;   AMPUTATION Right 07/07/2022   Procedure: RIGHT BELOW KNEE AMPUTATION;  Surgeon: Harden Jerona GAILS, MD;  Location: [facility] OR;  Service: Orthopedics;  Laterality: Right;   AORTOGRAM Bilateral 03/13/2021   Procedure: ABDOMINAL AORTOGRAM WITH Left  LOWER EXTREMITY RUNOFF;  Surgeon: Magda Debby SAILOR, MD;  Location: [facility] OR;  Service: Vascular;  Laterality: Bilateral;   APPLICATION OF WOUND VAC  01/08/2022   Procedure: APPLICATION OF WOUND VAC;  Surgeon: Harden Jerona GAILS, MD;  Location: [facility] OR;  Service: Orthopedics;;   APPLICATION OF WOUND VAC Right 07/07/2022   Procedure: APPLICATION OF WOUND VAC;  Surgeon: Harden Jerona GAILS, MD;  Location: [facility] OR;  Service: Orthopedics;  Laterality: Right;   CARDIAC CATHETERIZATION  2006   CARDIAC CATHETERIZATION  1996   @ [facility]; when I had my heart attack   CARDIAC CATHETERIZATION N/A 07/29/2016   Procedure: Left Heart Cath and Coronary Angiography;  Surgeon: Dorn JINNY Lesches, MD;  Location: [facility] INVASIVE CV LAB;  Service: Cardiovascular;  Laterality: N/A;   CARDIAC CATHETERIZATION N/A 07/29/2016   Procedure: Coronary Stent Intervention;  Surgeon: Dorn JINNY Lesches, MD;  Location: [facility];  Service: Cardiovascular;  Laterality: N/A;   CARPAL TUNNEL RELEASE Bilateral    CORONARY ANGIOPLASTY WITH STENT PLACEMENT  07/29/2016   CORONARY ARTERY BYPASS GRAFT N/A 07/24/2020   Procedure: CORONARY ARTERY BYPASS GRAFTING (CABG), ON PUMP, TIMES FOUR, USING LEFT INTERNAL MAMMARY ARTERY AND ENDOSCOPICALLY HARVESTED RIGHT GREATER SAPHENOUS VEIN;  Surgeon: Shyrl Linnie KIDD, MD;  Location: [facility] OR;  Service: Open Heart Surgery;  Laterality: N/A;  FLOW TAC   CORONARY PRESSURE/FFR STUDY N/A 07/22/2020   Procedure: INTRAVASCULAR PRESSURE WIRE/FFR STUDY;  Surgeon: Anner Alm ORN, MD;  Location: [facility] INVASIVE CV LAB;  Service: Cardiovascular;  Laterality: N/A;   CORONARY STENT INTERVENTION N/A 05/30/2019   Procedure: CORONARY STENT INTERVENTION;  Surgeon: Claudene Victory ORN, MD;  Location: [facility];  Service: Cardiovascular;  Laterality: N/A;   CORONARY STENT INTERVENTION N/A 11/02/2019   Procedure: CORONARY STENT INTERVENTION;  Surgeon: Darron Deatrice LABOR, MD;  Location:  [facility];  Service: Cardiovascular;   Laterality: N/A;   ESOPHAGOGASTRODUODENOSCOPY N/A 09/22/2017   Procedure: ESOPHAGOGASTRODUODENOSCOPY (EGD);  Surgeon: Aneita Gwendlyn DASEN, MD;  Location: [facility] ENDOSCOPY;  Service: Endoscopy;  Laterality: N/A;   KNEE ARTHROSCOPY Bilateral    2 on left; 1 on the right   LEFT HEART CATH AND CORONARY ANGIOGRAPHY N/A 11/02/2019   Procedure: LEFT HEART CATH AND CORONARY ANGIOGRAPHY;  Surgeon: Darron Deatrice LABOR, MD;  Location: [facility];  Service: Cardiovascular;  Laterality: N/A;   LEFT HEART CATH AND CORONARY ANGIOGRAPHY N/A 07/22/2020   Procedure: LEFT HEART CATH AND CORONARY ANGIOGRAPHY;  Surgeon: Anner Alm ORN, MD;  Location: [facility] INVASIVE CV LAB;  Service: Cardiovascular;  Laterality: N/A;   LEFT HEART CATH AND CORS/GRAFTS ANGIOGRAPHY N/A 08/17/2021   Procedure: LEFT HEART CATH AND CORS/GRAFTS ANGIOGRAPHY;  Surgeon: Mady Bruckner, MD;  Location: [facility];  Service: Cardiovascular;  Laterality: N/A;   PERIPHERAL VASCULAR INTERVENTION Left 10/11/2020   popliteal and SFA stent placement    PERIPHERAL VASCULAR INTERVENTION Left 10/10/2020   Procedure: PERIPHERAL VASCULAR INTERVENTION;  Surgeon: Harvey Carlin BRAVO, MD;  Location: [facility];  Service: Cardiovascular;  Laterality: Left;   RIGHT/LEFT HEART CATH AND CORONARY ANGIOGRAPHY N/A 05/30/2019   Procedure: RIGHT/LEFT HEART CATH AND CORONARY ANGIOGRAPHY;  Surgeon: Claudene Victory ORN, MD;  Location: [facility];  Service: Cardiovascular;  Laterality: N/A;   RIGHT/LEFT HEART CATH AND CORONARY/GRAFT ANGIOGRAPHY N/A 06/17/2022   Procedure: RIGHT/LEFT HEART CATH AND CORONARY/GRAFT ANGIOGRAPHY;  Surgeon: Burnard Debby LABOR, MD;  Location: [facility];  Service: Cardiovascular;  Laterality: N/A;   SHOULDER OPEN ROTATOR CUFF REPAIR Bilateral    TEE WITHOUT CARDIOVERSION N/A 07/24/2020   Procedure: TRANSESOPHAGEAL ECHOCARDIOGRAM (TEE);  Surgeon: Shyrl Linnie KIDD, MD;  Location: [facility] OR;  Service: Open Heart Surgery;  Laterality:  N/A;   Social History   Occupational History   Occupation: disabled     Associate Professor: UNEMPLOYED  Tobacco Use   Smoking status: Former    Current packs/day: 0.00    Average packs/day: 1 pack/day for 34.0 years (34.0 ttl pk-yrs)    Types: Cigarettes    Start date: 07/12/1986    Quit date: 07/12/2020    Years since quitting: 4.4   Smokeless tobacco: Former    Types: Snuff, Chew  Vaping Use   Vaping status: Never Used  Substance and Sexual Activity   Alcohol use: Yes    Comment: RARE   Drug use: No   Sexual activity: Yes

## 2024-12-05 ENCOUNTER — Other Ambulatory Visit (HOSPITAL_COMMUNITY): Payer: Self-pay

## 2024-12-06 ENCOUNTER — Other Ambulatory Visit (HOSPITAL_COMMUNITY): Payer: Self-pay

## 2024-12-06 MED ORDER — OXYCODONE HCL 15 MG PO TABS
15.0000 mg | ORAL_TABLET | Freq: Four times a day (QID) | ORAL | 0 refills | Status: DC
  Filled ????-??-??: fill #0

## 2024-12-06 MED ORDER — PREGABALIN 150 MG PO CAPS
300.0000 mg | ORAL_CAPSULE | Freq: Two times a day (BID) | ORAL | 0 refills | Status: DC
  Filled 2024-12-30 – 2025-01-04 (×2): qty 90, 30d supply, fill #1
  Filled ????-??-??: fill #0

## 2024-12-06 MED ORDER — ALPRAZOLAM 2 MG PO TABS
2.0000 mg | ORAL_TABLET | Freq: Two times a day (BID) | ORAL | 0 refills | Status: DC
  Filled 2024-12-06 – 2024-12-07 (×2): qty 60, 30d supply, fill #0

## 2024-12-06 MED ORDER — ONDANSETRON 8 MG PO TBDP
8.0000 mg | ORAL_TABLET | Freq: Every day | ORAL | 0 refills | Status: AC | PRN
  Filled 2024-12-06: qty 30, 30d supply, fill #0

## 2024-12-07 ENCOUNTER — Other Ambulatory Visit (HOSPITAL_COMMUNITY): Payer: Self-pay

## 2024-12-16 NOTE — Progress Notes (Unsigned)
 " Cardiology Office Note:    Date:  12/17/2024   ID:  Joseph [name], DOB [birth year], MRN [id number]  PCP:  Alec House, MD   [facility] Providers Cardiologist:  Oneil Parchment, MD     Referring MD: Alec House, MD   Chief complaint: 6 month follow up CAD     History of Present Illness:   Joseph [name] is a 53 y.o. male with a hx of  CAD s/p CABG (LIMA- LAD, SVG-PDA, SVG-OM1, SVG-D1) '21, bicuspid aortic valve with mild dilatation of ascending aorta, PAD s/p bilateral BKAs, COPD, hypertension, hyperlipidemia, ischemic cardiomyopathy, diabetes with diabetic neuropathy, OSA, history of DVT/PE on Xarelto  presenting to the clinic for follow-up of his chronic cardiac conditions.  History of bare-metal stent to distal circumflex and eventual 4v CABG in 2021.  He underwent 2 stents to the popliteal and left SFA with partial amputation of his foot and below the knee amputation 12/2021. Underwent cardiac cath 05/2022 with patent 4/4 grafts.  He was admitted in 04/2023 with a CHF exacerbation treated with IV Lasix .  He was seen by nephrology for possible nephrotic syndrome.  Creatinine at discharge was 2.54.  Admitted 09/2023 after a fall and again 11/2023 with lower abdominal pain and found to have emphysematous cystitis with a 14-day course of antibiotics and Foley catheter placed.  Prolonged hospitalization in the setting of hyperkalemia that required Lokelma .   Admitted 12/2023 with complaints of chest pain, and elevated white blood cell count of 18.6.  He was started on ceftriaxone  for cellulitis of his left BKA and had to have recurrent Foley placed due to urinary retention.  Creatinine peaked at 3 with improvement to 1.2 prior to discharge.   Admitted 03/2024 with acute on chronic diastolic heart failure and treated with IV Lasix  with home discharge dose of Lasix  80mg  BID.  Also noted to have a UTI.  Echocardiogram during that admission showed LVEF of 55 to 60%, no regional wall motion  abnormality, normal RV, trivial MR, functional bicuspid aortic valve without significant stenosis.  At last office visit in May 2025 patient continued to complain of chest pain.  Lexiscan  Myoview  05/09/2024 was high risk with a defect noted to be consistent with ischemia, stress EF 37%.  Ranexa  was increased to 1000 mg twice daily with consideration for repeat cardiac catheterization if symptoms had not improved.   He presented to ED 06/2024 after a fall with complaints of chest pain.  Cardiology was consulted, who felt the patient was stable and without acute change, troponin 23->24, not felt to be ACS.  Given the patient's risk for progression of graft disease was considered to be low combined with the presence of significant renal dysfunction (creatinine 2.59, BUN 35), it was felt the risk for contrast induced nephropathy and need for dialysis exceeded the benefits of LHC.  Ischemic workup was deferred at this time with instructions for outpatient follow-up.  Presents independently, appears stable from a cardiovascular standpoint, currently asymptomatic. He reports continued episodes of left sided chest pain. These can occur at rest or with exertion. Occasionally relieved w/ nitroglycerin  (using this around once/month), occurring 1-2 times/week, no significant change from the chest pain he's had all year. His bedroom is upstairs, and he must crawl up and down the stairs daily to get upstairs. States he does not regularly have chest pain with this. He reports SOB at rest, worsening over the last 6-8 months. He stopped taking his lasix  2-3 months ago, restarted 80 mg  once daily last week. He has not been taking his aspirin  regularly, I only take it when I feel like I'm about to have a heart attack. He denies palpitations. He does report occasionally feeling near syncopal, no known causative factor, reports BPs are stable. He reports coffee ground stools for years, denies having had a colonoscopy 2/2 not  wanting to take the prep given his bilateral BKAs and difficulties with getting to the restroom. Denies recent hematuria. Does not check weights at home. Reports swelling in bilateral thighs, sleeps on 3 pillows, reports occasional PND. The majority of his symptoms have been on-going over the course of this year, but he does feel as though his SOB is progressively worsening. Started smoking cigarettes at 15, smoked 1.5 ppd until his CABG surgery in 2021 when he quit. Last visited nephrology a few weeks ago, patient cannot remember if they made any changes to medical therapies. Sees Dr. Harden with ortho for care of his bilateral BKAs.  ROS:   Please see the history of present illness.    All other systems reviewed and are negative.     Past Medical History:  Diagnosis Date   Aneurysm of ascending aorta 09/14/2021   Anxiety    Aortic atherosclerosis 11/28/2023   Arthritis    knees, shoulders, hips, ankles (07/29/2016)   Asthma    Bicuspid aortic valve 09/14/2021   BMI 70 and over, adult (HCC) 02/16/2019   CAD (coronary artery disease)    a. 2017: s/p BMS to distal Cx; b. LHC 05/30/2019: 80% mid, distal RCA s/p DES, 30% narrowing of d LM, widely patent LAD w/ luminal irregularities, widely patent stent in dCX  w/ 90+% stenosis distal to stent beofre small trifurcating obtuse marginal (potentially area of restenosis)   Chronic bronchitis (HCC)    Chronic ulcer of great toe of right foot (HCC) 09/09/2017   Chronic ulcer of right great toe (HCC) 09/19/2017   COPD (chronic obstructive pulmonary disease) (HCC)    Coronary arteriosclerosis    Deep venous thrombosis (HCC) 02/16/2019   Depression    Diabetes mellitus (HCC)    DVT (deep venous thrombosis) (HCC)    GERD (gastroesophageal reflux disease)    Hyperlipidemia    Hypertension    Migraine    Morbid obesity (HCC)    Morbid obesity with BMI of 70 and over, adult (HCC) 11/28/2023   Myocardial infarction (HCC) 1996   light one   Non-ST  elevation (NSTEMI) myocardial infarction (HCC) 07/22/2020   PE (pulmonary embolism) 04/2013   On chronic Xarelto    Peripheral nerve disease    Pneumonia several times   Sleep apnea    Type II diabetes mellitus (HCC)     Past Surgical History:  Procedure Laterality Date   ABDOMINAL AORTOGRAM W/LOWER EXTREMITY N/A 10/10/2020   Procedure: ABDOMINAL AORTOGRAM W/LOWER EXTREMITY;  Surgeon: Harvey Carlin BRAVO, MD;  Location: [facility] IN[facility];  Service: Cardiovascular;  Laterality: N/A;   AMPUTATION Right 09/21/2017   Procedure: RIGHT GREAT TOE AMPUTATION, POSSIBLE VAC;  Surgeon: Jerri Kay HERO, MD;  Location: [facility] OR;  Service: Orthopedics;  Laterality: Right;   AMPUTATION Left 08/13/2020   Procedure: LEFT FOOT 4TH RAY AMPUTATION;  Surgeon: Harden Jerona GAILS, MD;  Location: [facility] OR;  Service: Orthopedics;  Laterality: Left;   AMPUTATION Left 11/20/2021   Procedure: LEFT TRANSMETATARSAL AMPUTATION;  Surgeon: Harden Jerona GAILS, MD;  Location: [facility] OR;  Service: Orthopedics;  Laterality: Left;   AMPUTATION Left 01/08/2022   Procedure: LEFT  BELOW KNEE AMPUTATION;  Surgeon: Harden Jerona GAILS, MD;  Location: [facility] OR;  Service: Orthopedics;  Laterality: Left;   AMPUTATION Right 07/07/2022   Procedure: RIGHT BELOW KNEE AMPUTATION;  Surgeon: Harden Jerona GAILS, MD;  Location: [facility] OR;  Service: Orthopedics;  Laterality: Right;   AORTOGRAM Bilateral 03/13/2021   Procedure: ABDOMINAL AORTOGRAM WITH Left LOWER EXTREMITY RUNOFF;  Surgeon: Magda Debby SAILOR, MD;  Location: [facility] OR;  Service: Vascular;  Laterality: Bilateral;   APPLICATION OF WOUND VAC  01/08/2022   Procedure: APPLICATION OF WOUND VAC;  Surgeon: Harden Jerona GAILS, MD;  Location: [facility] OR;  Service: Orthopedics;;   APPLICATION OF WOUND VAC Right 07/07/2022   Procedure: APPLICATION OF WOUND VAC;  Surgeon: Harden Jerona GAILS, MD;  Location: [facility] OR;  Service: Orthopedics;  Laterality: Right;   CARDIAC CATHETERIZATION  2006   CARDIAC CATHETERIZATION  1996   @ [facility]; when I had my heart  attack   CARDIAC CATHETERIZATION N/A 07/29/2016   Procedure: Left Heart Cath and Coronary Angiography;  Surgeon: Dorn JINNY Lesches, MD;  Location: [facility] INVASIVE CV LAB;  Service: Cardiovascular;  Laterality: N/A;   CARDIAC CATHETERIZATION N/A 07/29/2016   Procedure: Coronary Stent Intervention;  Surgeon: Dorn JINNY Lesches, MD;  Location: [facility];  Service: Cardiovascular;  Laterality: N/A;   CARPAL TUNNEL RELEASE Bilateral    CORONARY ANGIOPLASTY WITH STENT PLACEMENT  07/29/2016   CORONARY ARTERY BYPASS GRAFT N/A 07/24/2020   Procedure: CORONARY ARTERY BYPASS GRAFTING (CABG), ON PUMP, TIMES FOUR, USING LEFT INTERNAL MAMMARY ARTERY AND ENDOSCOPICALLY HARVESTED RIGHT GREATER SAPHENOUS VEIN;  Surgeon: Shyrl Linnie KIDD, MD;  Location: [facility] OR;  Service: Open Heart Surgery;  Laterality: N/A;  FLOW TAC   CORONARY PRESSURE/FFR STUDY N/A 07/22/2020   Procedure: INTRAVASCULAR PRESSURE WIRE/FFR STUDY;  Surgeon: Anner Alm ORN, MD;  Location: [facility] INVASIVE CV LAB;  Service: Cardiovascular;  Laterality: N/A;   CORONARY STENT INTERVENTION N/A 05/30/2019   Procedure: CORONARY STENT INTERVENTION;  Surgeon: Claudene Victory ORN, MD;  Location: [facility];  Service: Cardiovascular;  Laterality: N/A;   CORONARY STENT INTERVENTION N/A 11/02/2019   Procedure: CORONARY STENT INTERVENTION;  Surgeon: Darron Deatrice LABOR, MD;  Location: [facility];  Service: Cardiovascular;  Laterality: N/A;   ESOPHAGOGASTRODUODENOSCOPY N/A 09/22/2017   Procedure: ESOPHAGOGASTRODUODENOSCOPY (EGD);  Surgeon: Aneita Gwendlyn DASEN, MD;  Location: [facility] ENDOSCOPY;  Service: Endoscopy;  Laterality: N/A;   KNEE ARTHROSCOPY Bilateral    2 on left; 1 on the right   LEFT HEART CATH AND CORONARY ANGIOGRAPHY N/A 11/02/2019   Procedure: LEFT HEART CATH AND CORONARY ANGIOGRAPHY;  Surgeon: Darron Deatrice LABOR, MD;  Location: [facility];  Service: Cardiovascular;  Laterality: N/A;   LEFT HEART CATH AND CORONARY ANGIOGRAPHY N/A 07/22/2020    Procedure: LEFT HEART CATH AND CORONARY ANGIOGRAPHY;  Surgeon: Anner Alm ORN, MD;  Location: [facility] INVASIVE CV LAB;  Service: Cardiovascular;  Laterality: N/A;   LEFT HEART CATH AND CORS/GRAFTS ANGIOGRAPHY N/A 08/17/2021   Procedure: LEFT HEART CATH AND CORS/GRAFTS ANGIOGRAPHY;  Surgeon: Mady Bruckner, MD;  Location: [facility];  Service: Cardiovascular;  Laterality: N/A;   PERIPHERAL VASCULAR INTERVENTION Left 10/11/2020   popliteal and SFA stent placement    PERIPHERAL VASCULAR INTERVENTION Left 10/10/2020   Procedure: PERIPHERAL VASCULAR INTERVENTION;  Surgeon: Harvey Carlin BRAVO, MD;  Location: [facility];  Service: Cardiovascular;  Laterality: Left;   RIGHT/LEFT HEART CATH AND CORONARY ANGIOGRAPHY N/A 05/30/2019   Procedure: RIGHT/LEFT HEART CATH  AND CORONARY ANGIOGRAPHY;  Surgeon: Claudene Victory ORN, MD;  Location: [facility] INVASIVE CV LAB;  Service: Cardiovascular;  Laterality: N/A;   RIGHT/LEFT HEART CATH AND CORONARY/GRAFT ANGIOGRAPHY N/A 06/17/2022   Procedure: RIGHT/LEFT HEART CATH AND CORONARY/GRAFT ANGIOGRAPHY;  Surgeon: Burnard Debby LABOR, MD;  Location: [facility];  Service: Cardiovascular;  Laterality: N/A;   SHOULDER OPEN ROTATOR CUFF REPAIR Bilateral    TEE WITHOUT CARDIOVERSION N/A 07/24/2020   Procedure: TRANSESOPHAGEAL ECHOCARDIOGRAM (TEE);  Surgeon: Shyrl Linnie KIDD, MD;  Location: [facility] OR;  Service: Open Heart Surgery;  Laterality: N/A;    Current Medications: Active Medications[1]   Allergies:   Duloxetine and Sulfa  antibiotics   Social History   Socioeconomic History   Marital status: Married    Spouse name: Not on file   Number of children: Not on file   Years of education: Not on file   Highest education level: Not on file  Occupational History   Occupation: disabled     Employer: UNEMPLOYED  Tobacco Use   Smoking status: Former    Current packs/day: 0.00    Average packs/day: 1 pack/day for 34.0 years (34.0 ttl pk-yrs)    Types: Cigarettes     Start date: 07/12/1986    Quit date: 07/12/2020    Years since quitting: 4.4   Smokeless tobacco: Former    Types: Snuff, Chew  Vaping Use   Vaping status: Never Used  Substance and Sexual Activity   Alcohol use: Yes    Comment: RARE   Drug use: No   Sexual activity: Yes  Other Topics Concern   Not on file  Social History Narrative   Not on file   Social Drivers of Health   Tobacco Use: Medium Risk (12/17/2024)   Patient History    Smoking Tobacco Use: Former    Smokeless Tobacco Use: Former    Passive Exposure: Not on Actuary Strain: Medium Risk (09/09/2022)   Overall Financial Resource Strain (CARDIA)    Difficulty of Paying Living Expenses: Somewhat hard  Food Insecurity: No Food Insecurity (03/28/2024)   Hunger Vital Sign    Worried About Running Out of Food in the Last Year: Never true    Ran Out of Food in the Last Year: Never true  Transportation Needs: Unmet Transportation Needs (03/28/2024)   PRAPARE - Transportation    Lack of Transportation (Medical): Yes    Lack of Transportation (Non-Medical): Yes  Physical Activity: Not on file  Stress: No Stress Concern Present (10/10/2023)   Received from [facility] - Occupational Stress Questionnaire    Feeling of Stress : Not at all  Social Connections: Moderately Isolated (03/28/2024)   Social Connection and Isolation Panel    Frequency of Communication with Friends and Family: More than three times a week    Frequency of Social Gatherings with Friends and Family: More than three times a week    Attends Religious Services: Never    Database Administrator or Organizations: No    Attends Banker Meetings: Never    Marital Status: Married  Depression (PHQ2-9): Not on file  Alcohol Screen: Not on file  Housing: Low Risk (03/28/2024)   Housing Stability Vital Sign    Unable to Pay for Housing in the Last Year: No    Number of Times Moved in the Last  Year: 0    Homeless in the Last Year: No  Utilities: Not At Risk (03/28/2024)  AHC Utilities    Threatened with loss of utilities: No  Health Literacy: Not on file     Family History: The patient's family history includes Diabetes in an other family member; Hyperlipidemia in an other family member; Hypertension in his brother and father.  EKGs/Labs/Other Studies Reviewed:    The following studies were reviewed today:  EKG Interpretation Date/Time:  Monday December 17 2024 09:38:26 EST Ventricular Rate:  91 PR Interval:  196 QRS Duration:  108 QT Interval:  326 QTC Calculation: 400 R Axis:   -47  Text Interpretation: Normal sinus rhythm Left axis deviation Low voltage QRS Inferior infarct , age undetermined Anterolateral infarct (cited on or before 06-Apr-2024) No significant change since prior study Confirmed by Latecia Miler [phone]) on 12/17/2024 9:50:07 AM    Recent Labs: 01/09/2024: ALT 21 03/27/2024: B Natriuretic Peptide 90.4 04/01/2024: Magnesium  2.0 04/27/2024: NT-Pro BNP 895 07/17/2024: BUN 35; Creatinine, Ser 2.59; Hemoglobin 13.4; Platelets 259; Potassium 4.5; Sodium 138  Recent Lipid Panel    Component Value Date/Time   CHOL 136 05/02/2023 0128   TRIG 89 05/02/2023 0128   HDL 40 (L) 05/02/2023 0128   CHOLHDL 3.4 05/02/2023 0128   VLDL 18 05/02/2023 0128   LDLCALC 78 05/02/2023 0128   LDLDIRECT 120.0 05/28/2015 0951              Physical Exam:    VS:  BP 123/72   Pulse 95   Ht 5' 1 (1.549 m)   SpO2 93%   BMI 64.24 kg/m        Wt Readings from Last 3 Encounters:  07/17/24 (!) 340 lb (154.2 kg)  04/27/24 (!) 360 lb (163.3 kg)  04/06/24 (!) 380 lb 1.2 oz (172.4 kg)     GEN: Obese, well developed in no acute distress HEENT: Normal NECK: No JVD; No carotid bruits CARDIAC:  S1-S2 normal, RRR, systolic murmur 2/6, rubs, gallops RESPIRATORY:  Clear to auscultation without rales, wheezing or rhonchi  MUSCULOSKELETAL:  1+ bilateral pitting edema in  BKAs/thighs; No deformity  SKIN: Warm and dry NEUROLOGIC:  Alert and oriented x 3 PSYCHIATRIC:  Normal affect       Assessment & Plan Coronary artery disease involving native coronary artery of native heart with angina pectoris S/P CABG x 4 LIMA- LAD, SVG-PDA, SVG-OM1, SVG-D1 Stress test positive for ischemia May 2025 Deferred ischemic workup in July 2025 following for kidney function with low suspicion for ACS. EKG: NSR, 91 bpm, old anterolateral infarct, no significant change since prior study Chest pain, left sided, unchanged from previous visit in May. Nitroglycerin  occasionally improves pain.  Occurring at random and with exertion. Not regularly occurring when he climbs 15 stairs on his hands/knees to get to his bedroom daily. Will attempt to optimize medical therapy prior to [facility] given hx of renal dysfunction. Recommended he take his aspirin  daily, follow up w/ PCP for dark stools as below Start imdur  15 mg daily for angina  Continue aspirin  EC 81 mg daily Continue atorvastatin  80 mg daily Continue Plavix  75 mg daily Continue nitroglycerin  0.4 mg SL tab PRN chest pain every 5 minutes, will refill today. Continue Ranexa  1000 mg twice daily Discussed case w/ DOD Dr. Kriste, who agrees with plan. Discussed strict ED precautions Chronic heart failure with preserved ejection fraction (HCC) Ischemic cardiomyopathy Bicuspid aortic valve Mild dilation of ascending aorta Echo 03/28/2024: LVEF 55 to 60%, no RWMA, normal LV function, mild LVH, normal diastolic parameters.  Normal RV, trivial MV, functionally bicuspid aortic  valve, AV sclerosis without stenosis.  Mild dilatation of the ascending aorta measuring 41 mm. Reports slowly progressing SOB at rest. Stopped taking all lasix  2-3 months ago. Restarted lasix  80 mg once daily last week when he felt worse. Sleeps on 3 pillows, has occasional PND.  No JVD. 1+ bilateral pitting thigh edema. Clear lung sounds. Systolic murmur 2/6. Suspect SOB  is being exacerbated 2/2 lasix  non-compliance. Restart lasix  as prescribed at 80 mg BID.  Continue losartan  hydrochlorothiazide  100-25 mg daily, could consider reducing dosing if BP does not tolerate increased lasix . Update CMP, CBC, Pro-BNP today. Recheck BMP in 2 weeks following lasix  therapy. Will optimize medical therapy, then reconsider need for echo at follow up Near syncope Reports this has happened rarely. No aggravating/alleviating factors, no prodrome. Will repeat carotid dopplers given history of mild stenosis previously and hx of PAD requiring BKA Denies palpitations. If continuing into next visit with stable BPs, could consider zio monitoring at that time.  Discussed strict ED precautions Essential hypertension BPs stable, recommending daily monitoring Restart Lasix  80 mg twice daily Continue losartan  hydrochlorothiazide  100-25 mg daily, could consider reducing this if BP does not tolerate lasix  and imdur .  Watch for s/s of low BP with addition of medication, notify office if this occurs If you experience any further symptoms of near syncope, please proceed to the ED Hyperlipidemia, unspecified hyperlipidemia type LDL goal <55 Will update fasting lipids and LFTs Continue atorvastatin  80 mg daily Continue vascepa  2g daily Will consider referral to lipid clinic at follow up for consideration of repatha if LDL still not at goal.  Dark stools Reports coffee ground stools for years Unsure whether he mentioned this to his PCP No hx of colonoscopy as patient does not want to do the prep 2/2 BKA Will order CBC today, follow up w/ PCP/GI PAD (peripheral artery disease) Bilateral BKAs, followed by Dr. Harden  Disposition: Follow up with me in 1 month, follow up w/ Dr. Jeffrie in 4 months. Proceed to the ED if symptoms worsen/change.            Medication Adjustments/Labs and Tests Ordered: Current medicines are reviewed at length with the patient today.  Concerns regarding  medicines are outlined above.  Orders Placed This Encounter  Procedures   Comp Met (CMET)   CBC   Lipid panel   Basic Metabolic Panel (BMET)   Pro b natriuretic peptide (BNP)   EKG 12-Lead   VAS US  CAROTID   Meds ordered this encounter  Medications   nitroGLYCERIN  (NITROSTAT ) 0.4 MG SL tablet    Sig: Place 1 tablet (0.4 mg total) under the tongue every 5 (five) minutes as needed.    Dispense:  25 tablet    Refill:  3   isosorbide  mononitrate (IMDUR ) 30 MG 24 hr tablet    Sig: Take 1/2 tablet (15 mg total) by mouth daily.    Dispense:  45 tablet    Refill:  3    Patient Instructions  Medication Instructions:  START Imdur  15 mg Take half of a tablet daily  RESTART Lasix  80 mg Take 1 tablet twice daily Take Aspirin  81 mg Take 1 tablet daily  *If you need a refill on your cardiac medications before your next appointment, please call your pharmacy*  Lab Work: CMP, CBC and PROBNP Today Fasting Lipid panel and BMET in 2 weeks  If you have labs (blood work) drawn today and your tests are completely normal, you will receive your results only by: MyChart  Message (if you have MyChart) OR A paper copy in the mail If you have any lab test that is abnormal or we need to change your treatment, we will call you to review the results.  Testing/Procedures: Your physician has requested that you have a carotid duplex. This test is an ultrasound of the carotid arteries in your neck. It looks at blood flow through these arteries that supply the brain with blood. Allow one hour for this exam. There are no restrictions or special instructions.   Follow-Up: At [facility], you and your health needs are our priority.  As part of our continuing mission to provide you with exceptional heart care, our providers are all part of one team.  This team includes your primary Cardiologist (physician) and Advanced Practice Providers or APPs (Physician Assistants and Nurse Practitioners) who all work  together to provide you with the care you need, when you need it.  Your next appointment:   1 month follow up Ichelle Harral, Jessen Siegman, NP  4 month follow up Dr. Jeffrie  We recommend signing up for the patient portal called MyChart.  Sign up information is provided on this After Visit Summary.  MyChart is used to connect with patients for Virtual Visits (Telemedicine).  Patients are able to view lab/test results, encounter notes, upcoming appointments, etc.  Non-urgent messages can be sent to your provider as well.   To learn more about what you can do with MyChart, go to [email or web address].   Other Instructions If any of your symptoms get worst, please go to the nearest emergency department           Signed, Jaedan Huttner E Myalee Stengel, NP  12/17/2024 11:26 AM    [facility]     [1]  Current Meds  Medication Sig   acetaminophen  (TYLENOL ) 500 MG tablet Take 1,000 mg by mouth 3 (three) times daily as needed for headache.   albuterol  (VENTOLIN  HFA) 108 (90 Base) MCG/ACT inhaler Inhale 2 puffs into the lungs every 6 (six) hours as needed for shortness of breath.   alprazolam  (XANAX ) 2 MG tablet Take 1 tablet (2 mg total) by mouth 2 (two) times daily.   aspirin  EC 81 MG tablet Take 81 mg by mouth in the morning. Swallow whole. (Patient taking differently: Take 81 mg by mouth daily. Swallow whole.)   atorvastatin  (LIPITOR ) 80 MG tablet Take 1 tablet (80 mg total) by mouth daily.   buPROPion  (WELLBUTRIN  XL) 300 MG 24 hr tablet Take 1 tablet (300 mg total) by mouth every morning.   clopidogrel  (PLAVIX ) 75 MG tablet Take 1 tablet (75 mg total) by mouth daily.   Continuous Glucose Sensor (FREESTYLE LIBRE 3 PLUS SENSOR) MISC Use as directed to monitor blood sugar. Change every 15 days.   fluticasone  (FLONASE ) 50 MCG/ACT nasal spray Place 2 sprays into both nostrils daily.   furosemide  (LASIX ) 80 MG tablet Take 1 tablet (80 mg total) by mouth 2 (two) times daily.   insulin  glargine (LANTUS   SOLOSTAR) 100 UNIT/ML Solostar Pen Inject 38 Units into the skin 2 (two) times daily. (Patient taking differently: Inject 38-45 Units into the skin 2 (two) times daily. Takes 50 units twice daily)   insulin  lispro (HUMALOG  KWIKPEN) 100 UNIT/ML KwikPen Inject 12 Units into the skin before a meal. May take up to 45 units per day.   insulin  lispro (HUMALOG  KWIKPEN) 100 UNIT/ML KwikPen Inject 12 Units into the skin 3 (three) times daily before meals. Can take up to 45 units  per day.   isosorbide  mononitrate (IMDUR ) 30 MG 24 hr tablet Take 1/2 tablet (15 mg total) by mouth daily.   ketotifen  (ZADITOR ) 0.035 % ophthalmic solution Place 1 drop into the left eye 2 (two) times daily.   lidocaine  (LIDODERM ) 5 % Place 1 patch to each stump once daily, remove after 12 hours.   loratadine  (CLARITIN ) 10 MG tablet Take 10 mg by mouth daily.   losartan -hydrochlorothiazide  (HYZAAR ) 100-25 MG tablet Take 1 tablet by mouth daily.   ondansetron  (ZOFRAN -ODT) 8 MG disintegrating tablet Place 1 tablet on the tongue and allow to dissolve once daily as needed.   oxyCODONE  (ROXICODONE ) 15 MG immediate release tablet Take 1 tablet (15 mg total) by mouth 4-5 times daily.   pantoprazole  (PROTONIX ) 40 MG tablet Take 1 tablet (40 mg total) by mouth 2 (two) times daily.   prednisoLONE  acetate (PRED FORTE ) 1 % ophthalmic suspension Place 1 drop into the left eye 4 (four) times daily.   pregabalin  (LYRICA ) 150 MG capsule Take 2 capsules (300 mg total) by mouth 2 (two) times daily.   promethazine  (PHENERGAN ) 25 MG tablet Take 25 mg by mouth every 4 (four) hours as needed for nausea or vomiting.   ranolazine  (RANEXA ) 1000 MG SR tablet TAKE 1 TABLET(1000 MG) BY MOUTH TWICE DAILY   VASCEPA  1 g capsule TAKE TWO CAPSULES (2 G TOTAL) BY MOUTH TWICE DAILY (APPOINTMENT OVERDUE)   [DISCONTINUED] nitroGLYCERIN  (NITROSTAT ) 0.4 MG SL tablet Place 1 tablet (0.4 mg total) under the tongue every 5 (five) minutes as needed.   "

## 2024-12-17 ENCOUNTER — Ambulatory Visit: Attending: Nurse Practitioner | Admitting: Emergency Medicine

## 2024-12-17 ENCOUNTER — Other Ambulatory Visit (HOSPITAL_COMMUNITY): Payer: Self-pay

## 2024-12-17 ENCOUNTER — Encounter: Payer: Self-pay | Admitting: Nurse Practitioner

## 2024-12-17 ENCOUNTER — Other Ambulatory Visit: Payer: Self-pay | Admitting: Cardiology

## 2024-12-17 VITALS — BP 123/72 | HR 95 | Ht 61.0 in

## 2024-12-17 DIAGNOSIS — Q2381 Bicuspid aortic valve: Secondary | ICD-10-CM | POA: Diagnosis not present

## 2024-12-17 DIAGNOSIS — I77810 Thoracic aortic ectasia: Secondary | ICD-10-CM | POA: Diagnosis not present

## 2024-12-17 DIAGNOSIS — I255 Ischemic cardiomyopathy: Secondary | ICD-10-CM | POA: Diagnosis not present

## 2024-12-17 DIAGNOSIS — R55 Syncope and collapse: Secondary | ICD-10-CM | POA: Diagnosis not present

## 2024-12-17 DIAGNOSIS — I5032 Chronic diastolic (congestive) heart failure: Secondary | ICD-10-CM | POA: Diagnosis not present

## 2024-12-17 DIAGNOSIS — E785 Hyperlipidemia, unspecified: Secondary | ICD-10-CM | POA: Diagnosis not present

## 2024-12-17 DIAGNOSIS — Z951 Presence of aortocoronary bypass graft: Secondary | ICD-10-CM

## 2024-12-17 DIAGNOSIS — I739 Peripheral vascular disease, unspecified: Secondary | ICD-10-CM | POA: Diagnosis not present

## 2024-12-17 DIAGNOSIS — I25119 Atherosclerotic heart disease of native coronary artery with unspecified angina pectoris: Secondary | ICD-10-CM

## 2024-12-17 DIAGNOSIS — I10 Essential (primary) hypertension: Secondary | ICD-10-CM | POA: Diagnosis not present

## 2024-12-17 DIAGNOSIS — R195 Other fecal abnormalities: Secondary | ICD-10-CM | POA: Diagnosis not present

## 2024-12-17 LAB — CBC

## 2024-12-17 MED ORDER — ISOSORBIDE MONONITRATE ER 30 MG PO TB24
15.0000 mg | ORAL_TABLET | Freq: Every day | ORAL | 3 refills | Status: AC
  Filled 2024-12-17: qty 45, 90d supply, fill #0

## 2024-12-17 MED ORDER — NITROGLYCERIN 0.4 MG SL SUBL
0.4000 mg | SUBLINGUAL_TABLET | SUBLINGUAL | 3 refills | Status: AC | PRN
  Filled 2024-12-17: qty 25, 10d supply, fill #0

## 2024-12-17 NOTE — Patient Instructions (Addendum)
 Medication Instructions:  START Imdur  15 mg Take half of a tablet daily  RESTART Lasix  80 mg Take 1 tablet twice daily Take Aspirin  81 mg Take 1 tablet daily  *If you need a refill on your cardiac medications before your next appointment, please call your pharmacy*  Lab Work: CMP, CBC and PROBNP Today Fasting Lipid panel and BMET in 2 weeks  If you have labs (blood work) drawn today and your tests are completely normal, you will receive your results only by: MyChart Message (if you have MyChart) OR A paper copy in the mail If you have any lab test that is abnormal or we need to change your treatment, we will call you to review the results.  Testing/Procedures: Your physician has requested that you have a carotid duplex. This test is an ultrasound of the carotid arteries in your neck. It looks at blood flow through these arteries that supply the brain with blood. Allow one hour for this exam. There are no restrictions or special instructions.   Follow-Up: At [facility], you and your health needs are our priority.  As part of our continuing mission to provide you with exceptional heart care, our providers are all part of one team.  This team includes your primary Cardiologist (physician) and Advanced Practice Providers or APPs (Physician Assistants and Nurse Practitioners) who all work together to provide you with the care you need, when you need it.  Your next appointment:   1 month follow up Kenzie, Campbell, NP  4 month follow up Dr. Jeffrie  We recommend signing up for the patient portal called MyChart.  Sign up information is provided on this After Visit Summary.  MyChart is used to connect with patients for Virtual Visits (Telemedicine).  Patients are able to view lab/test results, encounter notes, upcoming appointments, etc.  Non-urgent messages can be sent to your provider as well.   To learn more about what you can do with MyChart, go to [email or web address].   Other  Instructions If any of your symptoms get worst, please go to the nearest emergency department

## 2024-12-17 NOTE — Assessment & Plan Note (Addendum)
 Echo 03/28/2024: LVEF 55 to 60%, no RWMA, normal LV function, mild LVH, normal diastolic parameters.  Normal RV, trivial MV, functionally bicuspid aortic valve, AV sclerosis without stenosis.  Mild dilatation of the ascending aorta measuring 41 mm. Reports slowly progressing SOB at rest. Stopped taking all lasix  2-3 months ago. Restarted lasix  80 mg once daily last week when he felt worse. Sleeps on 3 pillows, has occasional PND.  No JVD. 1+ bilateral pitting thigh edema. Clear lung sounds. Systolic murmur 2/6. Suspect SOB is being exacerbated 2/2 lasix  non-compliance. Restart lasix  as prescribed at 80 mg BID.  Continue losartan  hydrochlorothiazide  100-25 mg daily, could consider reducing dosing if BP does not tolerate increased lasix . Update CMP, CBC, Pro-BNP today. Recheck BMP in 2 weeks following lasix  therapy. Will optimize medical therapy, then reconsider need for echo at follow up

## 2024-12-17 NOTE — Assessment & Plan Note (Addendum)
 BPs stable, recommending daily monitoring Restart Lasix  80 mg twice daily Continue losartan  hydrochlorothiazide  100-25 mg daily, could consider reducing this if BP does not tolerate lasix  and imdur .  Watch for s/s of low BP with addition of medication, notify office if this occurs If you experience any further symptoms of near syncope, please proceed to the ED

## 2024-12-17 NOTE — Assessment & Plan Note (Addendum)
 LIMA- LAD, SVG-PDA, SVG-OM1, SVG-D1 Stress test positive for ischemia May 2025 Deferred ischemic workup in July 2025 following for kidney function with low suspicion for ACS. EKG: NSR, 91 bpm, old anterolateral infarct, no significant change since prior study Chest pain, left sided, unchanged from previous visit in May. Nitroglycerin  occasionally improves pain.  Occurring at random and with exertion. Not regularly occurring when he climbs 15 stairs on his hands/knees to get to his bedroom daily. Will attempt to optimize medical therapy prior to [facility] given hx of renal dysfunction. Recommended he take his aspirin  daily, follow up w/ PCP for dark stools as below Start imdur  15 mg daily for angina  Continue aspirin  EC 81 mg daily Continue atorvastatin  80 mg daily Continue Plavix  75 mg daily Continue nitroglycerin  0.4 mg SL tab PRN chest pain every 5 minutes, will refill today. Continue Ranexa  1000 mg twice daily Discussed case w/ DOD Dr. Kriste, who agrees with plan. Discussed strict ED precautions

## 2024-12-18 ENCOUNTER — Ambulatory Visit: Payer: Self-pay | Admitting: Emergency Medicine

## 2024-12-18 ENCOUNTER — Encounter (HOSPITAL_COMMUNITY): Payer: Self-pay

## 2024-12-18 ENCOUNTER — Emergency Department (HOSPITAL_COMMUNITY)

## 2024-12-18 ENCOUNTER — Telehealth: Payer: Self-pay

## 2024-12-18 ENCOUNTER — Emergency Department (HOSPITAL_COMMUNITY)
Admission: EM | Admit: 2024-12-18 | Discharge: 2024-12-18 | Disposition: A | Discharge status: Home / Self Care | Attending: Emergency Medicine | Admitting: Emergency Medicine

## 2024-12-18 DIAGNOSIS — R079 Chest pain, unspecified: Secondary | ICD-10-CM | POA: Diagnosis not present

## 2024-12-18 DIAGNOSIS — Z7901 Long term (current) use of anticoagulants: Secondary | ICD-10-CM | POA: Insufficient documentation

## 2024-12-18 DIAGNOSIS — R899 Unspecified abnormal finding in specimens from other organs, systems and tissues: Secondary | ICD-10-CM | POA: Diagnosis present

## 2024-12-18 DIAGNOSIS — I25119 Atherosclerotic heart disease of native coronary artery with unspecified angina pectoris: Secondary | ICD-10-CM

## 2024-12-18 DIAGNOSIS — Z7982 Long term (current) use of aspirin: Secondary | ICD-10-CM | POA: Insufficient documentation

## 2024-12-18 DIAGNOSIS — Z794 Long term (current) use of insulin: Secondary | ICD-10-CM | POA: Insufficient documentation

## 2024-12-18 DIAGNOSIS — G8929 Other chronic pain: Secondary | ICD-10-CM

## 2024-12-18 LAB — COMPREHENSIVE METABOLIC PANEL WITH GFR
ALT: 11 IU/L (ref 0–44)
AST: 17 IU/L (ref 0–40)
Albumin: 3.5 g/dL — ABNORMAL LOW (ref 3.8–4.9)
Alkaline Phosphatase: 151 IU/L — ABNORMAL HIGH (ref 47–123)
BUN/Creatinine Ratio: 16 (ref 9–20)
BUN: 49 mg/dL — ABNORMAL HIGH (ref 6–24)
Bilirubin Total: 0.3 mg/dL (ref 0.0–1.2)
CO2: 16 mmol/L — ABNORMAL LOW (ref 20–29)
Calcium: 8.9 mg/dL (ref 8.7–10.2)
Chloride: 107 mmol/L — ABNORMAL HIGH (ref 96–106)
Creatinine, Ser: 3.09 mg/dL — ABNORMAL HIGH (ref 0.76–1.27)
Globulin, Total: 3 g/dL (ref 1.5–4.5)
Glucose: 189 mg/dL — ABNORMAL HIGH (ref 70–99)
Potassium: 6.1 mmol/L (ref 3.5–5.2)
Sodium: 142 mmol/L (ref 134–144)
Total Protein: 6.5 g/dL (ref 6.0–8.5)
eGFR: 24 mL/min/1.73 — ABNORMAL LOW

## 2024-12-18 LAB — CBC
HCT: 36.9 % — ABNORMAL LOW (ref 39.0–52.0)
Hematocrit: 38.6 % (ref 37.5–51.0)
Hemoglobin: 12.1 g/dL — AB (ref 13.0–17.7)
Hemoglobin: 11.7 g/dL — ABNORMAL LOW (ref 13.0–17.0)
MCH: 27.3 pg (ref 26.6–33.0)
MCH: 28 pg (ref 26.0–34.0)
MCHC: 31.3 g/dL — AB (ref 31.5–35.7)
MCHC: 31.7 g/dL (ref 30.0–36.0)
MCV: 87 fL (ref 79–97)
MCV: 88.3 fL (ref 80.0–100.0)
Platelets: 241 x10E3/uL (ref 150–450)
Platelets: 216 K/uL (ref 150–400)
RBC: 4.43 x10E6/uL (ref 4.14–5.80)
RBC: 4.18 MIL/uL — ABNORMAL LOW (ref 4.22–5.81)
RDW: 13.8 % (ref 11.6–15.4)
RDW: 14.4 % (ref 11.5–15.5)
WBC: 8.1 x10E3/uL (ref 3.4–10.8)
WBC: 7.1 K/uL (ref 4.0–10.5)
nRBC: 0 % (ref 0.0–0.2)

## 2024-12-18 LAB — BASIC METABOLIC PANEL WITH GFR
Anion gap: 11 (ref 5–15)
BUN: 54 mg/dL — ABNORMAL HIGH (ref 6–20)
CO2: 21 mmol/L — ABNORMAL LOW (ref 22–32)
Calcium: 8.6 mg/dL — ABNORMAL LOW (ref 8.9–10.3)
Chloride: 105 mmol/L (ref 98–111)
Creatinine, Ser: 3.05 mg/dL — ABNORMAL HIGH (ref 0.61–1.24)
GFR, Estimated: 24 mL/min — ABNORMAL LOW
Glucose, Bld: 266 mg/dL — ABNORMAL HIGH (ref 70–99)
Potassium: 5.1 mmol/L (ref 3.5–5.1)
Sodium: 137 mmol/L (ref 135–145)

## 2024-12-18 LAB — TROPONIN T, HIGH SENSITIVITY
Troponin T High Sensitivity: 37 ng/L — ABNORMAL HIGH (ref 0–19)
Troponin T High Sensitivity: 32 ng/L — ABNORMAL HIGH (ref 0–19)

## 2024-12-18 LAB — PRO B NATRIURETIC PEPTIDE: NT-Pro BNP: 285 pg/mL — ABNORMAL HIGH (ref 0–121)

## 2024-12-18 NOTE — ED Notes (Signed)
 Pt informed of discharge and transport being called for him. Pt agreed to discharge plan and transport.

## 2024-12-18 NOTE — ED Notes (Signed)
PTAR called by secretary.  

## 2024-12-18 NOTE — ED Provider Notes (Signed)
 " [place] EMERGENCY DEPARTMENT AT [facility] Provider Note   CSN: [id number] Arrival date & time: 12/18/24  9867     Patient presents with: Abnormal Lab   Joseph [name] is a 53 y.o. male.   53 yo M with a chief complaint of an elevated potassium level.  He went to see his cardiologist yesterday and had blood work obtained.  Potassium was 6.1 he was called at home and encouraged to come to the ED.  Patient has chest discomfort.  He tells me it is at his baseline.  Denies any significant change from previous.  He otherwise feels like he has been doing fine.   Abnormal Lab      Prior to Admission medications  Medication Sig Start Date End Date Taking? Authorizing Provider  acetaminophen  (TYLENOL ) 500 MG tablet Take 1,000 mg by mouth 3 (three) times daily as needed for headache.    [provider]  albuterol  (VENTOLIN  HFA) 108 (90 Base) MCG/ACT inhaler Inhale 2 puffs into the lungs every 6 (six) hours as needed for shortness of breath.    [provider]  ALPRAZolam  (XANAX ) 1 MG tablet Take 1 tablet (1 mg total) by mouth 2 (two) times daily as needed. Patient not taking: Reported on 12/17/2024 03/26/24     ALPRAZolam  (XANAX ) 1 MG tablet Take 1-2 tablets (1-2 mg total) by mouth 2 (two) times daily as needed. Patient not taking: Reported on 12/17/2024 05/10/24     ALPRAZolam  (XANAX ) 1 MG tablet Take 1-2 tablets (1-2 mg total) by mouth 2 (two) times daily as needed. Patient not taking: Reported on 12/17/2024 05/31/24     alprazolam  (XANAX ) 2 MG tablet Take 1 tablet (2 mg total) by mouth 2 (two) times daily. 12/06/24     aspirin  EC 81 MG tablet Take 81 mg by mouth in the morning. Swallow whole. Patient taking differently: Take 81 mg by mouth daily. Swallow whole.    [provider]  atorvastatin  (LIPITOR ) 80 MG tablet Take 1 tablet (80 mg total) by mouth daily. 04/27/24   Wyn Jackee VEAR Mickey., NP  Blood Pressure Monitoring (OMRON 3 SERIES BP MONITOR) DEVI  Ues as directed. Patient not taking: Reported on 12/17/2024 04/27/24   Wyn Jackee VEAR Mickey., NP  buPROPion  (WELLBUTRIN  XL) 150 MG 24 hr tablet Take 150 mg by mouth in the morning and at bedtime. Patient not taking: Reported on 12/17/2024    [provider]  buPROPion  (WELLBUTRIN  XL) 300 MG 24 hr tablet Take 1 tablet (300 mg total) by mouth every morning. 11/07/24     cephALEXin  (KEFLEX ) 500 MG capsule Take 1 capsule (500 mg total) by mouth 2 (two) times daily for 10 days. Patient not taking: Reported on 12/17/2024 05/10/24     clopidogrel  (PLAVIX ) 75 MG tablet Take 1 tablet (75 mg total) by mouth daily. 05/10/24   Wyn Jackee VEAR Mickey., NP  Continuous Glucose Sensor (FREESTYLE LIBRE 3 PLUS SENSOR) MISC Use as directed to monitor blood sugar. Change every 15 days. 04/19/24     escitalopram  (LEXAPRO ) 10 MG tablet Take 10 mg by mouth daily. Patient not taking: Reported on 12/17/2024 01/27/22   [provider]  fluticasone  (FLONASE ) 50 MCG/ACT nasal spray Place 2 sprays into both nostrils daily.    [provider]  fluticasone  (FLONASE ) 50 MCG/ACT nasal spray Place 1 spray into both nostrils 2 (two) times daily. Patient not taking: Reported on 12/17/2024 07/27/24     furosemide  (LASIX ) 80 MG  tablet Take 1 tablet (80 mg total) by mouth 2 (two) times daily. 04/27/24   Wyn Jackee VEAR Mickey., NP  insulin  aspart (NOVOLOG ) 100 UNIT/ML injection Inject 8 Units into the skin 3 (three) times daily with meals. Hold if eats less than 50% of meals Patient not taking: Reported on 12/17/2024 01/14/24   Jillian Buttery, MD  insulin  glargine (LANTUS  SOLOSTAR) 100 UNIT/ML Solostar Pen Inject 38 Units into the skin 2 (two) times daily. Patient taking differently: Inject 38-45 Units into the skin 2 (two) times daily. Takes 50 units twice daily 01/14/24   Jillian Buttery, MD  insulin  lispro (HUMALOG  KWIKPEN) 100 UNIT/ML KwikPen Inject 12 Units into the skin before a meal. May take up to 45 units per day. 05/17/24      insulin  lispro (HUMALOG  KWIKPEN) 100 UNIT/ML KwikPen Inject 12 Units into the skin 3 (three) times daily before meals. Can take up to 45 units per day. 10/30/24     isosorbide  mononitrate (IMDUR ) 30 MG 24 hr tablet Take 1/2 tablet (15 mg total) by mouth daily. 12/17/24   Campbell, Kenzie E, NP  ketotifen  (ZADITOR ) 0.035 % ophthalmic solution Place 1 drop into the left eye 2 (two) times daily. 12/03/23   Tobie Yetta HERO, MD  lidocaine  (LIDODERM ) 5 % Place 1 patch to each stump once daily, remove after 12 hours. 04/19/24     loratadine  (CLARITIN ) 10 MG tablet Take 10 mg by mouth daily. 09/27/22   [provider]  loratadine  (CLARITIN ) 10 MG tablet Take 1 tablet (10 mg total) by mouth daily. Patient not taking: Reported on 12/17/2024 07/27/24     losartan -hydrochlorothiazide  (HYZAAR ) 100-25 MG tablet Take 1 tablet by mouth daily. 12/04/24     midodrine  (PROAMATINE ) 5 MG tablet Take 1 tablet (5 mg total) by mouth 2 (two) times daily with a meal. 04/27/24   Wyn Jackee VEAR Mickey., NP  naloxone  (NARCAN ) nasal spray 4 mg/0.1 mL Place 4 mg into the nose as needed (overdose). Patient not taking: Reported on 12/17/2024    [provider]  naloxone  (NARCAN ) nasal spray 4 mg/0.1 mL Place 1 spray into the nose as needed and seek medical follow-up. Patient not taking: Reported on 12/17/2024 06/13/24     nitroGLYCERIN  (NITROSTAT ) 0.4 MG SL tablet Place 1 tablet (0.4 mg total) under the tongue every 5 (five) minutes as needed. 12/17/24   Campbell, Kenzie E, NP  ondansetron  (ZOFRAN -ODT) 8 MG disintegrating tablet Place 1 tablet on the tongue and allow to dissolve once daily as needed. 12/06/24     oxyCODONE  (ROXICODONE ) 15 MG immediate release tablet Take 1 tablet (15 mg total) by mouth every 4 (four) hours for 30 days Patient not taking: Reported on 12/17/2024 04/16/24     oxyCODONE  (ROXICODONE ) 15 MG immediate release tablet Take 1 tablet (15 mg total) by mouth every 4 (four) hours. MUST LAST 20 DAYS TILL  NEXT APPOINTMENT. Patient not taking: Reported on 12/17/2024 05/31/24     oxyCODONE  (ROXICODONE ) 15 MG immediate release tablet Take 1 tablet (15 mg total) by mouth every 4 (four) hours. Patient not taking: Reported on 12/17/2024 06/13/24     oxyCODONE  (ROXICODONE ) 15 MG immediate release tablet Take 1 tablet (15 mg total) by mouth every 4 (four) hours. Must last 30 days. Patient not taking: Reported on 12/17/2024 07/09/24     oxyCODONE  (ROXICODONE ) 15 MG immediate release tablet Take 1 tablet (15 mg total) by mouth 4-5 times daily. 07/27/24     oxyCODONE  (ROXICODONE ) 15 MG  immediate release tablet Take 1 tablet by mouth 4 - 5 times daily as needed Patient not taking: Reported on 12/17/2024 09/06/24     oxyCODONE  (ROXICODONE ) 15 MG immediate release tablet Take 1 tablet (15 mg total) by mouth 4 (four)-5 (five) times daily. Patient not taking: Reported on 12/17/2024 10/02/24     oxyCODONE  (ROXICODONE ) 15 MG immediate release tablet Take 1 tablet (15 mg total) by mouth 4 (four) to 5 (five) times daily. Patient not taking: Reported on 12/17/2024 12/06/24     pantoprazole  (PROTONIX ) 40 MG tablet Take 1 tablet (40 mg total) by mouth daily. Patient not taking: Reported on 12/17/2024 03/26/24     pantoprazole  (PROTONIX ) 40 MG tablet Take 1 tablet (40 mg total) by mouth 2 (two) times daily. 07/09/24     prednisoLONE  acetate (PRED FORTE ) 1 % ophthalmic suspension Place 1 drop into the left eye 4 (four) times daily. 02/19/24   [provider]  pregabalin  (LYRICA ) 100 MG capsule Take 1 capsule (100 mg total) by mouth 2 (two) times daily. Patient not taking: Reported on 12/17/2024 04/06/24   Arrien, Mauricio Daniel, MD  pregabalin  (LYRICA ) 150 MG capsule Take 2 capsules (300 mg total) by mouth 2 (two) times daily. Patient not taking: Reported on 12/17/2024 11/07/24     pregabalin  (LYRICA ) 150 MG capsule Take 2 capsules (300 mg total) by mouth 2 (two) times daily. 12/06/24     promethazine  (PHENERGAN ) 25 MG  tablet Take 25 mg by mouth every 4 (four) hours as needed for nausea or vomiting. 09/10/22   [provider]  ranolazine  (RANEXA ) 1000 MG SR tablet TAKE 1 TABLET(1000 MG) BY MOUTH TWICE DAILY 10/29/24   Jeffrie Oneil BROCKS, MD  silver  sulfADIAZINE  (SILVADENE ) 1 % cream Apply 1 Application topically daily until healed. Patient not taking: Reported on 12/17/2024 05/10/24     tiZANidine  (ZANAFLEX ) 4 MG tablet Take 4 mg by mouth 3 (three) times daily. Patient not taking: Reported on 12/17/2024 11/25/23   [provider]  VASCEPA  1 g capsule TAKE TWO CAPSULES (2 G TOTAL) BY MOUTH TWICE DAILY (APPOINTMENT OVERDUE) 05/24/24   Wyn Jackee VEAR Mickey., NP    Allergies: Duloxetine and Sulfa  antibiotics    Review of Systems  Updated Vital Signs BP (!) 142/75 (BP Location: Right Arm)   Pulse 90   Temp 98.2 F (36.8 C) (Oral)   Resp 16   SpO2 95%   Physical Exam Vitals and nursing note reviewed.  Constitutional:      Appearance: He is well-developed.  HENT:     Head: Normocephalic and atraumatic.  Eyes:     Pupils: Pupils are equal, round, and reactive to light.  Neck:     Vascular: No JVD.  Cardiovascular:     Rate and Rhythm: Normal rate and regular rhythm.     Heart sounds: No murmur heard.    No friction rub. No gallop.  Pulmonary:     Effort: No respiratory distress.     Breath sounds: No wheezing.  Abdominal:     General: There is no distension.     Tenderness: There is no abdominal tenderness. There is no guarding or rebound.  Musculoskeletal:        General: Normal range of motion.     Cervical back: Normal range of motion and neck supple.  Skin:    Coloration: Skin is not pale.     Findings: No rash.  Neurological:     Mental Status: He is alert and  oriented to person, place, and time.  Psychiatric:        Behavior: Behavior normal.     (all labs ordered are listed, but only abnormal results are displayed) Labs Reviewed  BASIC METABOLIC PANEL WITH GFR -  Abnormal; Notable for the following components:      Result Value   CO2 21 (*)    Glucose, Bld 266 (*)    BUN 54 (*)    Creatinine, Ser 3.05 (*)    Calcium  8.6 (*)    GFR, Estimated 24 (*)    All other components within normal limits  CBC - Abnormal; Notable for the following components:   RBC 4.18 (*)    Hemoglobin 11.7 (*)    HCT 36.9 (*)    All other components within normal limits  TROPONIN T, HIGH SENSITIVITY - Abnormal; Notable for the following components:   Troponin T High Sensitivity 37 (*)    All other components within normal limits  TROPONIN T, HIGH SENSITIVITY - Abnormal; Notable for the following components:   Troponin T High Sensitivity 32 (*)    All other components within normal limits    EKG: EKG Interpretation Date/Time:  Tuesday December 18 2024 01:38:01 EST Ventricular Rate:  90 PR Interval:  184 QRS Duration:  110 QT Interval:  318 QTC Calculation: 389 R Axis:   -27  Text Interpretation: Normal sinus rhythm Low voltage QRS Incomplete right bundle branch block Inferior infarct , age undetermined Possible Anterolateral infarct , age undetermined Abnormal ECG No significant change since last tracing Confirmed by Emil Share [phone]) on 12/18/2024 2:34:30 AM  Radiology: DG Chest 2 View Result Date: 12/18/2024 EXAM: 2 VIEW(S) XRAY OF THE CHEST 12/18/2024 02:03:00 AM COMPARISON: 07/17/2024 CLINICAL HISTORY: chest pain FINDINGS: LUNGS AND PLEURA: Low lung volumes. Chronic atelectasis/scarring at left base. No focal pulmonary opacity. No pleural effusion. No pneumothorax. HEART AND MEDIASTINUM: CABG markers noted. No acute abnormality of the cardiac and mediastinal silhouettes. BONES AND SOFT TISSUES: Intact sternotomy wires. No acute osseous abnormality. IMPRESSION: 1. No acute findings. 2. Postsurgical changes of prior CABG. Electronically signed by: Franky Stanford MD 12/18/2024 03:34 AM EST RP Workstation: [id number]     Procedures   Medications Ordered in the  ED - No data to display                                  Medical Decision Making Amount and/or Complexity of Data Reviewed Labs: ordered. Radiology: ordered.   53 yo M with a chief complaints of an elevated potassium on an routine lab that was checked by the cardiology clinic yesterday.  Repeat potassium here is normal.  His renal function is elevated above his baseline but GFR is only changed by 5 points.  No acute anemia.  Troponin perhaps mildly elevated from previous check could be due to renal dysfunction.  Patient denies any significant change to his chronic chest discomfort.  He is willing to have a second troponin obtained.  Chest x-ray on my independent interpretation without obvious focal infiltrate or pneumothorax.  Second troponin downward trending from first.  Discussed results with patient.  Will discharge home.  PCP follow-up.  4:20 AM:  I have discussed the diagnosis/risks/treatment options with the patient.  Evaluation and diagnostic testing in the emergency department does not suggest an emergent condition requiring admission or immediate intervention beyond what has been performed at this time.  They will follow  up with PCP. We also discussed returning to the ED immediately if new or worsening sx occur. We discussed the sx which are most concerning (e.g., sudden worsening pain, fever, inability to tolerate by mouth) that necessitate immediate return. Medications administered to the patient during their visit and any new prescriptions provided to the patient are listed below.  Medications given during this visit Medications - No data to display   The patient appears reasonably screen and/or stabilized for discharge and I doubt any other medical condition or other [facility] requiring further screening, evaluation, or treatment in the ED at this time prior to discharge.       Final diagnoses:  Abnormal laboratory test  Chronic chest pain    ED Discharge Orders     None           Emil Share, DO 12/18/24 [id number]  "

## 2024-12-18 NOTE — ED Triage Notes (Signed)
 Brought by [facility], bilateral leg amputee, went to cardiology got labs drawn, pt received a call of potassium being 6.1   Alert and oriented x4, report chest pain 3 out of 10 and mild difficulty breathing.  Bp 180/90 HR 80 96%SPO2

## 2024-12-18 NOTE — Telephone Encounter (Signed)
 CORRECTION : Called patient, NA, left a message for HIM to contact our office for recent  results.

## 2024-12-18 NOTE — Telephone Encounter (Signed)
 Telephone note  Paged by [facility] regarding a critical alert for a potassium of 6.1 on labs done 12/29.  CMP otherwise notable for creatinine 3.09 (up from baseline 2.5), bicarb 16; NT proBNP 285 (down from 895 7 months ago), CBC not yet resulted.  Joseph [name] is a 53 year old male with CAD s/p CABG (2021), bicuspid aortic valve with mild ascending aortic dilatation, PAD s/p bilateral BKA's, COPD, hypertension, hyperlipidemia, ischemic cardiomyopathy (EF 55-60% 03/2024), type 2 diabetes, OSA, prior DVT/PE on Xarelto , CKD.  Of note, he was hospitalized 12/2023 with AKI on CKD complicated by hyperkalemia felt to be possibly due to bladder outlet obstruction given improvement with Foley placement.  Called patient to discuss next steps. He reports feeling run down for an undetermined period of time with nausea. Has chronic chest pain. Does note increased severity of chest pain and dyspnea for last few months. Denies fevers, change in urination. I advised Joseph [name] to present to the ER ASAP for evaluation. He reported he planned to discuss with his wife and either calling non-emergency line for transport or EMS. He planned to come to ED as soon as possible.

## 2024-12-18 NOTE — Discharge Instructions (Signed)
 Likely your test today look reassuring.  Please follow-up with your family doctor in the office.  Please follow-up with your cardiologist.

## 2024-12-19 MED ORDER — ICOSAPENT ETHYL 1 G PO CAPS: 2.0000 g | ORAL_CAPSULE | Freq: Two times a day (BID) | ORAL | 3 refills | Status: AC

## 2024-12-24 ENCOUNTER — Ambulatory Visit: Admitting: Physician Assistant

## 2024-12-25 ENCOUNTER — Ambulatory Visit (HOSPITAL_COMMUNITY)

## 2024-12-27 ENCOUNTER — Other Ambulatory Visit (HOSPITAL_COMMUNITY): Payer: Self-pay

## 2024-12-28 ENCOUNTER — Ambulatory Visit (HOSPITAL_COMMUNITY)
Admission: RE | Admit: 2024-12-28 | Discharge: 2024-12-28 | Disposition: A | Source: Ambulatory Visit | Attending: Emergency Medicine

## 2024-12-28 ENCOUNTER — Other Ambulatory Visit (HOSPITAL_COMMUNITY): Payer: Self-pay

## 2024-12-28 DIAGNOSIS — R55 Syncope and collapse: Secondary | ICD-10-CM | POA: Insufficient documentation

## 2024-12-28 LAB — LIPID PANEL
Chol/HDL Ratio: 3.8 ratio (ref 0.0–5.0)
Cholesterol, Total: 148 mg/dL (ref 100–199)
HDL: 39 mg/dL — ABNORMAL LOW
LDL Chol Calc (NIH): 79 mg/dL (ref 0–99)
Triglycerides: 177 mg/dL — ABNORMAL HIGH (ref 0–149)
VLDL Cholesterol Cal: 30 mg/dL (ref 5–40)

## 2024-12-28 LAB — BASIC METABOLIC PANEL WITH GFR
BUN/Creatinine Ratio: 22 — ABNORMAL HIGH (ref 9–20)
BUN: 61 mg/dL — ABNORMAL HIGH (ref 6–24)
CO2: 19 mmol/L — ABNORMAL LOW (ref 20–29)
Calcium: 8.7 mg/dL (ref 8.7–10.2)
Chloride: 105 mmol/L (ref 96–106)
Creatinine, Ser: 2.76 mg/dL — ABNORMAL HIGH (ref 0.76–1.27)
Glucose: 228 mg/dL — ABNORMAL HIGH (ref 70–99)
Potassium: 5.3 mmol/L — ABNORMAL HIGH (ref 3.5–5.2)
Sodium: 138 mmol/L (ref 134–144)
eGFR: 27 mL/min/1.73 — ABNORMAL LOW

## 2024-12-31 ENCOUNTER — Other Ambulatory Visit (HOSPITAL_COMMUNITY): Payer: Self-pay

## 2024-12-31 ENCOUNTER — Encounter: Payer: Self-pay | Admitting: Physician Assistant

## 2024-12-31 ENCOUNTER — Other Ambulatory Visit: Payer: Self-pay

## 2024-12-31 ENCOUNTER — Ambulatory Visit: Admitting: Physician Assistant

## 2024-12-31 DIAGNOSIS — L89899 Pressure ulcer of other site, unspecified stage: Secondary | ICD-10-CM | POA: Diagnosis not present

## 2024-12-31 NOTE — Progress Notes (Signed)
 "  Office Visit Note   Patient: Joseph [name]           Date of Birth: [birth year]           MRN: [id number] Visit Date: 12/31/2024              Requested by: Alec House, MD [address] [place],  [name] [address] PCP: Alec House, MD  Chief Complaint  Patient presents with   Left Leg - Follow-up    Hx left BKA 12/2021      HPI: 54 y/o male with B BKA.  He walks on his stumps to get to the rest room and climbing stairs in his house  daily.  He states he can't change where he lives or how he functions.  He has 2 ulcers on the left stump and 1 on the right.  He does wear black compression stump socks.  He has a motorized chair for mobility out side his home.    Assessment & Plan: Visit Diagnoses:  1. Pressure injury of skin of right lower leg   2. Pressure injury of skin of left lower leg     Plan: Bilateral BKA stump wounds. He crawls on his stumps around his home.  In the bathroom and up and down the stairs.   He is trying to find a way to pad his stumps.  I do not have any viable recommendations other than not sleeping up stairs.   He will continue to wash with soap and water and clean with Vashe.  Wear the compression black stump socks.     Follow-Up Instructions: Return in about 4 weeks (around 01/28/2025).   Ortho Exam  Patient is alert, oriented, no adenopathy, well-dressed, normal affect, normal respiratory effort. B BKA.  Left wound measures 1.5 cm x 3 cm.  The proximal left wound 1 cm x 1 cm.  The right stump wound is 1 cm x 0.5 cm. There is no cellulitis or active drainage.  The stumps are warm and well perfused.      Imaging: No results found.        Labs: Lab Results  Component Value Date   HGBA1C 8.1 (H) 01/07/2024   HGBA1C 8.0 (H) 11/27/2023   HGBA1C 7.4 (H) 04/26/2023   ESRSEDRATE 62 (H) 07/01/2022   ESRSEDRATE 45 (H) 11/18/2021   ESRSEDRATE 41 (H) 09/16/2017   CRP 12.0 (H) 07/01/2022   CRP 12.1 (H) 11/18/2021   CRP 8.9 (H) 09/16/2017    REPTSTATUS 04/02/2024 FINAL 03/31/2024   CULT >=100,000 COLONIES/mL PROTEUS MIRABILIS (A) 03/31/2024   LABORGA PROTEUS MIRABILIS (A) 03/31/2024     Lab Results  Component Value Date   ALBUMIN  3.5 (L) 12/17/2024   ALBUMIN  2.2 (L) 04/06/2024   ALBUMIN  2.4 (L) 04/05/2024   PREALBUMIN 16.5 (L) 01/07/2022    Lab Results  Component Value Date   MG 2.0 04/01/2024   MG 1.5 (L) 03/31/2024   MG 1.3 (L) 03/28/2024   Lab Results  Component Value Date   VD25OH 9.40 (L) 01/07/2022    Lab Results  Component Value Date   PREALBUMIN 16.5 (L) 01/07/2022      Latest Ref Rng & Units 12/18/2024    1:50 AM 12/17/2024   10:56 AM 07/17/2024    4:09 PM  CBC EXTENDED  WBC 4.0 - 10.5 K/uL 7.1  8.1  10.7   RBC 4.22 - 5.81 MIL/uL 4.18  4.43  4.85   Hemoglobin 13.0 - 17.0 g/dL  11.7  12.1  13.4   HCT 39.0 - 52.0 % 36.9  38.6  41.0   Platelets 150 - 400 K/uL 216  241  259      There is no height or weight on file to calculate BMI.  Orders:  No orders of the defined types were placed in this encounter.  No orders of the defined types were placed in this encounter.    Procedures: No procedures performed  Clinical Data: No additional findings.  ROS:  All other systems negative, except as noted in the HPI. Review of Systems  Objective: Vital Signs: There were no vitals taken for this visit.  Specialty Comments:  No specialty comments available.  PMFS History: Patient Active Problem List   Diagnosis Date Noted   Pressure injury 04/05/2024   UTI (urinary tract infection) 04/04/2024   Peripheral edema 03/31/2024   Acute kidney failure 01/09/2024   Acute urinary retention 01/08/2024   S/P bilateral BKA (below knee amputation) (HCC) 01/07/2024   Morbid obesity with BMI of 70 and over, adult (HCC) 11/28/2023   Aortic atherosclerosis 11/28/2023   CKD (chronic kidney disease), stage III (HCC) 11/28/2023   Weakness 04/28/2023   Nephrosis 04/27/2023   AKI (acute kidney injury)  04/26/2023   OSA (obstructive sleep apnea) 04/26/2023   Hx of iron deficiency anemia 07/07/2022   Below-knee amputation of left lower extremity (HCC)    Bipolar disorder with depression (HCC)    Dyspnea 11/18/2021   Bicuspid aortic valve 09/14/2021   Aneurysm of ascending aorta 09/14/2021   DM (diabetes mellitus) with peripheral vascular complication (HCC) 08/20/2021   Essential hypertension 07/17/2021   Cellulitis of left lower leg 04/11/2021   Acute on chronic diastolic CHF (congestive heart failure) (HCC) 07/22/2020   Acute kidney injury superimposed on stage 3b chronic kidney disease (HCC) 07/18/2020   Leukocytosis 05/31/2019   Smoker 03/08/2019   Chronic back pain 02/16/2019   BMI 70 and over, adult (HCC) 02/16/2019   Peripheral nerve disease 02/16/2019   COPD (chronic obstructive pulmonary disease) (HCC) 01/22/2019   Anxiety and depression 03/26/2018   Recurrent chest pain 07/29/2016   Hyperglycemia    Insulin  dependent type 2 diabetes mellitus (HCC) 04/29/2015   History of pulmonary embolus (PE) 04/27/2013   CAD (coronary artery disease) 09/10/2008   Past Medical History:  Diagnosis Date   Aneurysm of ascending aorta 09/14/2021   Anxiety    Aortic atherosclerosis 11/28/2023   Arthritis    knees, shoulders, hips, ankles (07/29/2016)   Asthma    Bicuspid aortic valve 09/14/2021   BMI 70 and over, adult (HCC) 02/16/2019   CAD (coronary artery disease)    a. 2017: s/p BMS to distal Cx; b. LHC 05/30/2019: 80% mid, distal RCA s/p DES, 30% narrowing of d LM, widely patent LAD w/ luminal irregularities, widely patent stent in dCX  w/ 90+% stenosis distal to stent beofre small trifurcating obtuse marginal (potentially area of restenosis)   Chronic bronchitis (HCC)    Chronic ulcer of great toe of right foot (HCC) 09/09/2017   Chronic ulcer of right great toe (HCC) 09/19/2017   COPD (chronic obstructive pulmonary disease) (HCC)    Coronary arteriosclerosis    Deep venous  thrombosis (HCC) 02/16/2019   Depression    Diabetes mellitus (HCC)    DVT (deep venous thrombosis) (HCC)    GERD (gastroesophageal reflux disease)    Hyperlipidemia    Hypertension    Migraine    Morbid obesity (HCC)  Morbid obesity with BMI of 70 and over, adult (HCC) 11/28/2023   Myocardial infarction (HCC) 1996   light one   Non-ST elevation (NSTEMI) myocardial infarction (HCC) 07/22/2020   PE (pulmonary embolism) 04/2013   On chronic Xarelto    Peripheral nerve disease    Pneumonia several times   Sleep apnea    Type II diabetes mellitus (HCC)     Family History  Problem Relation Age of Onset   Hypertension Brother    Hypertension Father    Diabetes Other    Hyperlipidemia Other     Past Surgical History:  Procedure Laterality Date   ABDOMINAL AORTOGRAM W/LOWER EXTREMITY N/A 10/10/2020   Procedure: ABDOMINAL AORTOGRAM W/LOWER EXTREMITY;  Surgeon: Harvey Carlin BRAVO, MD;  Location: [facility];  Service: Cardiovascular;  Laterality: N/A;   AMPUTATION Right 09/21/2017   Procedure: RIGHT GREAT TOE AMPUTATION, POSSIBLE VAC;  Surgeon: Jerri Kay HERO, MD;  Location: [facility] OR;  Service: Orthopedics;  Laterality: Right;   AMPUTATION Left 08/13/2020   Procedure: LEFT FOOT 4TH RAY AMPUTATION;  Surgeon: Harden Jerona GAILS, MD;  Location: [facility] OR;  Service: Orthopedics;  Laterality: Left;   AMPUTATION Left 11/20/2021   Procedure: LEFT TRANSMETATARSAL AMPUTATION;  Surgeon: Harden Jerona GAILS, MD;  Location: [facility] OR;  Service: Orthopedics;  Laterality: Left;   AMPUTATION Left 01/08/2022   Procedure: LEFT BELOW KNEE AMPUTATION;  Surgeon: Harden Jerona GAILS, MD;  Location: [facility] OR;  Service: Orthopedics;  Laterality: Left;   AMPUTATION Right 07/07/2022   Procedure: RIGHT BELOW KNEE AMPUTATION;  Surgeon: Harden Jerona GAILS, MD;  Location: [facility] OR;  Service: Orthopedics;  Laterality: Right;   AORTOGRAM Bilateral 03/13/2021   Procedure: ABDOMINAL AORTOGRAM WITH Left LOWER EXTREMITY RUNOFF;  Surgeon: Magda Debby SAILOR, MD;  Location: [facility] OR;  Service: Vascular;  Laterality: Bilateral;   APPLICATION OF WOUND VAC  01/08/2022   Procedure: APPLICATION OF WOUND VAC;  Surgeon: Harden Jerona GAILS, MD;  Location: [facility] OR;  Service: Orthopedics;;   APPLICATION OF WOUND VAC Right 07/07/2022   Procedure: APPLICATION OF WOUND VAC;  Surgeon: Harden Jerona GAILS, MD;  Location: [facility] OR;  Service: Orthopedics;  Laterality: Right;   CARDIAC CATHETERIZATION  2006   CARDIAC CATHETERIZATION  1996   @ [facility]; when I had my heart attack   CARDIAC CATHETERIZATION N/A 07/29/2016   Procedure: Left Heart Cath and Coronary Angiography;  Surgeon: Dorn JINNY Lesches, MD;  Location: [facility] INVASIVE CV LAB;  Service: Cardiovascular;  Laterality: N/A;   CARDIAC CATHETERIZATION N/A 07/29/2016   Procedure: Coronary Stent Intervention;  Surgeon: Dorn JINNY Lesches, MD;  Location: [facility];  Service: Cardiovascular;  Laterality: N/A;   CARPAL TUNNEL RELEASE Bilateral    CORONARY ANGIOPLASTY WITH STENT PLACEMENT  07/29/2016   CORONARY ARTERY BYPASS GRAFT N/A 07/24/2020   Procedure: CORONARY ARTERY BYPASS GRAFTING (CABG), ON PUMP, TIMES FOUR, USING LEFT INTERNAL MAMMARY ARTERY AND ENDOSCOPICALLY HARVESTED RIGHT GREATER SAPHENOUS VEIN;  Surgeon: Shyrl Linnie KIDD, MD;  Location: [facility] OR;  Service: Open Heart Surgery;  Laterality: N/A;  FLOW TAC   CORONARY PRESSURE/FFR STUDY N/A 07/22/2020   Procedure: INTRAVASCULAR PRESSURE WIRE/FFR STUDY;  Surgeon: Anner Alm ORN, MD;  Location: [facility] INVASIVE CV LAB;  Service: Cardiovascular;  Laterality: N/A;   CORONARY STENT INTERVENTION N/A 05/30/2019   Procedure: CORONARY STENT INTERVENTION;  Surgeon: Claudene Victory ORN, MD;  Location: [facility];  Service: Cardiovascular;  Laterality: N/A;   CORONARY STENT INTERVENTION N/A 11/02/2019   Procedure: CORONARY  STENT INTERVENTION;  Surgeon: Darron Deatrice LABOR, MD;  Location: [facility];  Service: Cardiovascular;  Laterality: N/A;   ESOPHAGOGASTRODUODENOSCOPY N/A  09/22/2017   Procedure: ESOPHAGOGASTRODUODENOSCOPY (EGD);  Surgeon: Aneita Gwendlyn DASEN, MD;  Location: [facility] ENDOSCOPY;  Service: Endoscopy;  Laterality: N/A;   KNEE ARTHROSCOPY Bilateral    2 on left; 1 on the right   LEFT HEART CATH AND CORONARY ANGIOGRAPHY N/A 11/02/2019   Procedure: LEFT HEART CATH AND CORONARY ANGIOGRAPHY;  Surgeon: Darron Deatrice LABOR, MD;  Location: [facility];  Service: Cardiovascular;  Laterality: N/A;   LEFT HEART CATH AND CORONARY ANGIOGRAPHY N/A 07/22/2020   Procedure: LEFT HEART CATH AND CORONARY ANGIOGRAPHY;  Surgeon: Anner Alm ORN, MD;  Location: [facility] INVASIVE CV LAB;  Service: Cardiovascular;  Laterality: N/A;   LEFT HEART CATH AND CORS/GRAFTS ANGIOGRAPHY N/A 08/17/2021   Procedure: LEFT HEART CATH AND CORS/GRAFTS ANGIOGRAPHY;  Surgeon: Mady Bruckner, MD;  Location: [facility];  Service: Cardiovascular;  Laterality: N/A;   PERIPHERAL VASCULAR INTERVENTION Left 10/11/2020   popliteal and SFA stent placement    PERIPHERAL VASCULAR INTERVENTION Left 10/10/2020   Procedure: PERIPHERAL VASCULAR INTERVENTION;  Surgeon: Harvey Carlin BRAVO, MD;  Location: [facility];  Service: Cardiovascular;  Laterality: Left;   RIGHT/LEFT HEART CATH AND CORONARY ANGIOGRAPHY N/A 05/30/2019   Procedure: RIGHT/LEFT HEART CATH AND CORONARY ANGIOGRAPHY;  Surgeon: Claudene Victory ORN, MD;  Location: [facility];  Service: Cardiovascular;  Laterality: N/A;   RIGHT/LEFT HEART CATH AND CORONARY/GRAFT ANGIOGRAPHY N/A 06/17/2022   Procedure: RIGHT/LEFT HEART CATH AND CORONARY/GRAFT ANGIOGRAPHY;  Surgeon: Burnard Debby LABOR, MD;  Location: [facility];  Service: Cardiovascular;  Laterality: N/A;   SHOULDER OPEN ROTATOR CUFF REPAIR Bilateral    TEE WITHOUT CARDIOVERSION N/A 07/24/2020   Procedure: TRANSESOPHAGEAL ECHOCARDIOGRAM (TEE);  Surgeon: Shyrl Linnie KIDD, MD;  Location: [facility] OR;  Service: Open Heart Surgery;  Laterality: N/A;   Social History   Occupational History    Occupation: disabled     Associate Professor: UNEMPLOYED  Tobacco Use   Smoking status: Former    Current packs/day: 0.00    Average packs/day: 1 pack/day for 34.0 years (34.0 ttl pk-yrs)    Types: Cigarettes    Start date: 07/12/1986    Quit date: 07/12/2020    Years since quitting: 4.4   Smokeless tobacco: Former    Types: Snuff, Chew  Vaping Use   Vaping status: Never Used  Substance and Sexual Activity   Alcohol use: Yes    Comment: RARE   Drug use: No   Sexual activity: Yes       "

## 2024-12-31 NOTE — Addendum Note (Signed)
 Addended by: CONNY KIEF T on: 12/31/2024 01:57 PM   Modules accepted: Orders

## 2025-01-01 ENCOUNTER — Other Ambulatory Visit (HOSPITAL_COMMUNITY): Payer: Self-pay

## 2025-01-01 MED ORDER — PREGABALIN 150 MG PO CAPS
300.0000 mg | ORAL_CAPSULE | Freq: Two times a day (BID) | ORAL | 0 refills | Status: DC
  Filled 2025-01-04: qty 120, 30d supply, fill #0

## 2025-01-01 MED ORDER — OXYCODONE HCL 15 MG PO TABS
15.0000 mg | ORAL_TABLET | Freq: Every day | ORAL | 0 refills | Status: AC | PRN
  Filled 2025-01-04: qty 150, 30d supply, fill #0

## 2025-01-01 MED ORDER — ALPRAZOLAM 2 MG PO TABS
2.0000 mg | ORAL_TABLET | Freq: Two times a day (BID) | ORAL | 0 refills | Status: AC
  Filled 2025-01-04: qty 50, 25d supply, fill #0

## 2025-01-01 NOTE — Progress Notes (Signed)
Patient has been notified directly; all questions, if any, were answered. Patient voiced understanding.   

## 2025-01-04 ENCOUNTER — Other Ambulatory Visit (HOSPITAL_COMMUNITY): Payer: Self-pay

## 2025-01-08 ENCOUNTER — Other Ambulatory Visit (HOSPITAL_COMMUNITY): Payer: Self-pay

## 2025-01-08 ENCOUNTER — Other Ambulatory Visit: Payer: Self-pay

## 2025-01-09 ENCOUNTER — Inpatient Hospital Stay (HOSPITAL_COMMUNITY)
Admission: EM | Admit: 2025-01-09 | Source: Home / Self Care | Attending: Internal Medicine | Admitting: Internal Medicine

## 2025-01-09 ENCOUNTER — Emergency Department (HOSPITAL_COMMUNITY): Admit: 2025-01-09 | Admitting: Cardiovascular Disease

## 2025-01-09 ENCOUNTER — Encounter (HOSPITAL_COMMUNITY): Admission: EM | Payer: Self-pay | Source: Home / Self Care | Attending: Internal Medicine

## 2025-01-09 ENCOUNTER — Inpatient Hospital Stay (HOSPITAL_COMMUNITY)

## 2025-01-09 DIAGNOSIS — I213 ST elevation (STEMI) myocardial infarction of unspecified site: Principal | ICD-10-CM

## 2025-01-09 DIAGNOSIS — N179 Acute kidney failure, unspecified: Secondary | ICD-10-CM | POA: Diagnosis not present

## 2025-01-09 DIAGNOSIS — E1165 Type 2 diabetes mellitus with hyperglycemia: Secondary | ICD-10-CM

## 2025-01-09 DIAGNOSIS — E875 Hyperkalemia: Secondary | ICD-10-CM

## 2025-01-09 DIAGNOSIS — R079 Chest pain, unspecified: Secondary | ICD-10-CM | POA: Diagnosis present

## 2025-01-09 DIAGNOSIS — I120 Hypertensive chronic kidney disease with stage 5 chronic kidney disease or end stage renal disease: Secondary | ICD-10-CM | POA: Diagnosis not present

## 2025-01-09 DIAGNOSIS — J449 Chronic obstructive pulmonary disease, unspecified: Secondary | ICD-10-CM | POA: Diagnosis not present

## 2025-01-09 DIAGNOSIS — N189 Chronic kidney disease, unspecified: Secondary | ICD-10-CM | POA: Diagnosis not present

## 2025-01-09 DIAGNOSIS — G9341 Metabolic encephalopathy: Secondary | ICD-10-CM | POA: Diagnosis present

## 2025-01-09 DIAGNOSIS — E872 Acidosis, unspecified: Secondary | ICD-10-CM

## 2025-01-09 DIAGNOSIS — E119 Type 2 diabetes mellitus without complications: Secondary | ICD-10-CM

## 2025-01-09 DIAGNOSIS — Z6841 Body Mass Index (BMI) 40.0 and over, adult: Secondary | ICD-10-CM

## 2025-01-09 DIAGNOSIS — N1832 Chronic kidney disease, stage 3b: Secondary | ICD-10-CM | POA: Diagnosis present

## 2025-01-09 DIAGNOSIS — E1122 Type 2 diabetes mellitus with diabetic chronic kidney disease: Secondary | ICD-10-CM | POA: Diagnosis not present

## 2025-01-09 DIAGNOSIS — G4733 Obstructive sleep apnea (adult) (pediatric): Secondary | ICD-10-CM | POA: Diagnosis not present

## 2025-01-09 DIAGNOSIS — E871 Hypo-osmolality and hyponatremia: Secondary | ICD-10-CM | POA: Diagnosis not present

## 2025-01-09 DIAGNOSIS — E1110 Type 2 diabetes mellitus with ketoacidosis without coma: Secondary | ICD-10-CM | POA: Diagnosis not present

## 2025-01-09 DIAGNOSIS — R338 Other retention of urine: Secondary | ICD-10-CM | POA: Diagnosis present

## 2025-01-09 DIAGNOSIS — I10 Essential (primary) hypertension: Secondary | ICD-10-CM | POA: Diagnosis present

## 2025-01-09 HISTORY — PX: LEFT HEART CATH AND CORS/GRAFTS ANGIOGRAPHY: CATH118250

## 2025-01-09 LAB — POCT I-STAT 7, (LYTES, BLD GAS, ICA,H+H)
Acid-base deficit: 5 mmol/L — ABNORMAL HIGH (ref 0.0–2.0)
Bicarbonate: 20.3 mmol/L (ref 20.0–28.0)
Calcium, Ion: 1.17 mmol/L (ref 1.15–1.40)
HCT: 28 % — ABNORMAL LOW (ref 39.0–52.0)
Hemoglobin: 9.5 g/dL — ABNORMAL LOW (ref 13.0–17.0)
O2 Saturation: 96 %
Patient temperature: 99.4
Potassium: 5.9 mmol/L — ABNORMAL HIGH (ref 3.5–5.1)
Sodium: 131 mmol/L — ABNORMAL LOW (ref 135–145)
TCO2: 21 mmol/L — ABNORMAL LOW (ref 22–32)
pCO2 arterial: 38.6 mmHg (ref 32–48)
pH, Arterial: 7.33 — ABNORMAL LOW (ref 7.35–7.45)
pO2, Arterial: 92 mmHg (ref 83–108)

## 2025-01-09 LAB — GLUCOSE, CAPILLARY
Glucose-Capillary: 335 mg/dL — ABNORMAL HIGH (ref 70–99)
Glucose-Capillary: 358 mg/dL — ABNORMAL HIGH (ref 70–99)
Glucose-Capillary: 390 mg/dL — ABNORMAL HIGH (ref 70–99)

## 2025-01-09 LAB — LIPID PANEL
Cholesterol: 110 mg/dL (ref 0–200)
HDL: 18 mg/dL — ABNORMAL LOW
LDL Cholesterol: 51 mg/dL (ref 0–99)
Total CHOL/HDL Ratio: 6.1 ratio
Triglycerides: 205 mg/dL — ABNORMAL HIGH
VLDL: 41 mg/dL — ABNORMAL HIGH (ref 0–40)

## 2025-01-09 LAB — CBC WITH DIFFERENTIAL/PLATELET
Abs Immature Granulocytes: 0.25 K/uL — ABNORMAL HIGH (ref 0.00–0.07)
Basophils Absolute: 0 K/uL (ref 0.0–0.1)
Basophils Relative: 0 %
Eosinophils Absolute: 0 K/uL (ref 0.0–0.5)
Eosinophils Relative: 0 %
HCT: 31.2 % — ABNORMAL LOW (ref 39.0–52.0)
Hemoglobin: 10 g/dL — ABNORMAL LOW (ref 13.0–17.0)
Immature Granulocytes: 2 %
Lymphocytes Relative: 7 %
Lymphs Abs: 0.9 K/uL (ref 0.7–4.0)
MCH: 27 pg (ref 26.0–34.0)
MCHC: 32.1 g/dL (ref 30.0–36.0)
MCV: 84.1 fL (ref 80.0–100.0)
Monocytes Absolute: 1 K/uL (ref 0.1–1.0)
Monocytes Relative: 7 %
Neutro Abs: 11.8 K/uL — ABNORMAL HIGH (ref 1.7–7.7)
Neutrophils Relative %: 84 %
Platelets: 299 K/uL (ref 150–400)
RBC: 3.71 MIL/uL — ABNORMAL LOW (ref 4.22–5.81)
RDW: 15.2 % (ref 11.5–15.5)
WBC: 14 K/uL — ABNORMAL HIGH (ref 4.0–10.5)
nRBC: 0 % (ref 0.0–0.2)

## 2025-01-09 LAB — TROPONIN T, HIGH SENSITIVITY
Troponin T High Sensitivity: 51 ng/L — ABNORMAL HIGH (ref 0–19)
Troponin T High Sensitivity: 50 ng/L — ABNORMAL HIGH (ref 0–19)

## 2025-01-09 LAB — COMPREHENSIVE METABOLIC PANEL WITH GFR
ALT: 23 U/L (ref 0–44)
AST: 21 U/L (ref 15–41)
Albumin: 3 g/dL — ABNORMAL LOW (ref 3.5–5.0)
Alkaline Phosphatase: 134 U/L — ABNORMAL HIGH (ref 38–126)
Anion gap: 16 — ABNORMAL HIGH (ref 5–15)
BUN: 92 mg/dL — ABNORMAL HIGH (ref 6–20)
CO2: 17 mmol/L — ABNORMAL LOW (ref 22–32)
Calcium: 8.6 mg/dL — ABNORMAL LOW (ref 8.9–10.3)
Chloride: 95 mmol/L — ABNORMAL LOW (ref 98–111)
Creatinine, Ser: 4.74 mg/dL — ABNORMAL HIGH (ref 0.61–1.24)
GFR, Estimated: 14 mL/min — ABNORMAL LOW
Glucose, Bld: 452 mg/dL — ABNORMAL HIGH (ref 70–99)
Potassium: 6.7 mmol/L (ref 3.5–5.1)
Sodium: 127 mmol/L — ABNORMAL LOW (ref 135–145)
Total Bilirubin: 0.7 mg/dL (ref 0.0–1.2)
Total Protein: 7.4 g/dL (ref 6.5–8.1)

## 2025-01-09 LAB — MAGNESIUM: Magnesium: 1.8 mg/dL (ref 1.7–2.4)

## 2025-01-09 LAB — MRSA NEXT GEN BY PCR, NASAL: MRSA by PCR Next Gen: DETECTED — AB

## 2025-01-09 LAB — BLOOD GAS, VENOUS
Acid-base deficit: 4.6 mmol/L — ABNORMAL HIGH (ref 0.0–2.0)
Bicarbonate: 22.9 mmol/L (ref 20.0–28.0)
O2 Saturation: 45.9 %
Patient temperature: 37.8
pCO2, Ven: 53 mmHg (ref 44–60)
pH, Ven: 7.25 (ref 7.25–7.43)
pO2, Ven: 36 mmHg (ref 32–45)

## 2025-01-09 LAB — URINE DRUG SCREEN
Amphetamines: NEGATIVE
Barbiturates: NEGATIVE
Benzodiazepines: POSITIVE — AB
Cocaine: NEGATIVE
Fentanyl: NEGATIVE
Methadone Scn, Ur: NEGATIVE
Opiates: NEGATIVE
Tetrahydrocannabinol: NEGATIVE

## 2025-01-09 LAB — RESP PANEL BY RT-PCR (RSV, FLU A&B, COVID)  RVPGX2
Influenza A by PCR: NEGATIVE
Influenza B by PCR: NEGATIVE
Resp Syncytial Virus by PCR: NEGATIVE
SARS Coronavirus 2 by RT PCR: NEGATIVE

## 2025-01-09 LAB — URINALYSIS, ROUTINE W REFLEX MICROSCOPIC
Bilirubin Urine: NEGATIVE
Glucose, UA: >=500 mg/dL — AB
Ketones, ur: NEGATIVE mg/dL
Leukocytes,Ua: NEGATIVE
Nitrite: NEGATIVE
Protein, ur: 100 mg/dL — AB
Specific Gravity, Urine: 1.016 (ref 1.005–1.030)
pH: 5 (ref 5.0–8.0)

## 2025-01-09 LAB — BETA-HYDROXYBUTYRIC ACID: Beta-Hydroxybutyric Acid: 0.09 mmol/L (ref 0.05–0.27)

## 2025-01-09 LAB — CBC
HCT: 31.4 % — ABNORMAL LOW (ref 39.0–52.0)
Hemoglobin: 10 g/dL — ABNORMAL LOW (ref 13.0–17.0)
MCH: 27.2 pg (ref 26.0–34.0)
MCHC: 31.8 g/dL (ref 30.0–36.0)
MCV: 85.6 fL (ref 80.0–100.0)
Platelets: 297 K/uL (ref 150–400)
RBC: 3.67 MIL/uL — ABNORMAL LOW (ref 4.22–5.81)
RDW: 15 % (ref 11.5–15.5)
WBC: 12.9 K/uL — ABNORMAL HIGH (ref 4.0–10.5)
nRBC: 0 % (ref 0.0–0.2)

## 2025-01-09 LAB — PHOSPHORUS: Phosphorus: 4.2 mg/dL (ref 2.5–4.6)

## 2025-01-09 LAB — AMMONIA: Ammonia: 17 umol/L (ref 9–35)

## 2025-01-09 LAB — HEMOGLOBIN A1C
Hgb A1c MFr Bld: 7.9 % — ABNORMAL HIGH (ref 4.8–5.6)
Mean Plasma Glucose: 180.03 mg/dL

## 2025-01-09 LAB — PROTIME-INR
INR: 1.4 — ABNORMAL HIGH (ref 0.8–1.2)
Prothrombin Time: 17.5 s — ABNORMAL HIGH (ref 11.4–15.2)

## 2025-01-09 LAB — APTT: aPTT: 143 s — ABNORMAL HIGH (ref 24–36)

## 2025-01-09 LAB — LACTIC ACID, PLASMA: Lactic Acid, Venous: 1.9 mmol/L (ref 0.5–1.9)

## 2025-01-09 MED ORDER — SENNA 8.6 MG PO TABS: 1.0000 | ORAL_TABLET | Freq: Two times a day (BID) | ORAL | Status: AC | PRN

## 2025-01-09 MED ORDER — ASPIRIN 325 MG PO TABS
ORAL_TABLET | ORAL | Status: AC
  Filled 2025-01-09: qty 1

## 2025-01-09 MED ORDER — CHLORHEXIDINE GLUCONATE CLOTH 2 % EX PADS
6.0000 | MEDICATED_PAD | Freq: Every day | CUTANEOUS | Status: AC
  Administered 2025-01-09 – 2025-01-21 (×10): 6 via TOPICAL

## 2025-01-09 MED ORDER — IOHEXOL 350 MG/ML SOLN
INTRAVENOUS | Status: DC | PRN
  Administered 2025-01-09: 50 mL

## 2025-01-09 MED ORDER — VERAPAMIL HCL 2.5 MG/ML IV SOLN
INTRAVENOUS | Status: DC | PRN
  Administered 2025-01-09: 10 mL via INTRA_ARTERIAL

## 2025-01-09 MED ORDER — ESCITALOPRAM OXALATE 10 MG PO TABS
10.0000 mg | ORAL_TABLET | Freq: Every day | ORAL | Status: DC
  Administered 2025-01-10 – 2025-01-11 (×2): 10 mg via ORAL
  Filled 2025-01-09 (×2): qty 1

## 2025-01-09 MED ORDER — PIPERACILLIN-TAZOBACTAM 3.375 G IVPB
3.3750 g | Freq: Three times a day (TID) | INTRAVENOUS | Status: DC
  Administered 2025-01-09 – 2025-01-12 (×8): 3.375 g via INTRAVENOUS
  Filled 2025-01-09 (×8): qty 50

## 2025-01-09 MED ORDER — LACTATED RINGERS IV BOLUS
1000.0000 mL | Freq: Once | INTRAVENOUS | Status: AC
  Administered 2025-01-09: 1000 mL via INTRAVENOUS

## 2025-01-09 MED ORDER — SODIUM CHLORIDE 0.9 % IV SOLN: 250.0000 mL | INTRAVENOUS | Status: AC | PRN

## 2025-01-09 MED ORDER — FUROSEMIDE 10 MG/ML IJ SOLN
120.0000 mg | Freq: Once | INTRAVENOUS | Status: AC
  Administered 2025-01-10: 120 mg via INTRAVENOUS
  Filled 2025-01-09: qty 12

## 2025-01-09 MED ORDER — MIDAZOLAM HCL (PF) 2 MG/2ML IJ SOLN
INTRAMUSCULAR | Status: DC | PRN
  Administered 2025-01-09 (×2): 1 mg via INTRAVENOUS

## 2025-01-09 MED ORDER — FENTANYL CITRATE (PF) 100 MCG/2ML IJ SOLN
INTRAMUSCULAR | Status: DC | PRN
  Administered 2025-01-09 (×2): 25 ug via INTRAVENOUS

## 2025-01-09 MED ORDER — LACTATED RINGERS IV SOLN: INTRAVENOUS | Status: DC

## 2025-01-09 MED ORDER — SODIUM ZIRCONIUM CYCLOSILICATE 10 G PO PACK
10.0000 g | PACK | Freq: Once | ORAL | Status: DC
  Filled 2025-01-09: qty 1

## 2025-01-09 MED ORDER — ATORVASTATIN CALCIUM 80 MG PO TABS
80.0000 mg | ORAL_TABLET | Freq: Every day | ORAL | Status: AC
  Administered 2025-01-10 – 2025-01-25 (×15): 80 mg via ORAL
  Filled 2025-01-09 (×16): qty 1

## 2025-01-09 MED ORDER — VANCOMYCIN HCL 10 G IV SOLR
2500.0000 mg | Freq: Once | INTRAVENOUS | Status: AC
  Administered 2025-01-10: 2500 mg via INTRAVENOUS
  Filled 2025-01-09: qty 25

## 2025-01-09 MED ORDER — HEPARIN SODIUM (PORCINE) 5000 UNIT/ML IJ SOLN
5000.0000 [IU] | Freq: Three times a day (TID) | INTRAMUSCULAR | Status: AC
  Administered 2025-01-09 – 2025-01-25 (×48): 5000 [IU] via SUBCUTANEOUS
  Filled 2025-01-09 (×49): qty 1

## 2025-01-09 MED ORDER — SODIUM CHLORIDE 0.9 % IV SOLN
INTRAVENOUS | Status: DC | PRN
  Administered 2025-01-09: 20 mL/h via INTRAVENOUS

## 2025-01-09 MED ORDER — POLYETHYLENE GLYCOL 3350 17 G PO PACK: 17.0000 g | PACK | Freq: Every day | ORAL | Status: AC | PRN

## 2025-01-09 MED ORDER — FENTANYL CITRATE (PF) 100 MCG/2ML IJ SOLN
INTRAMUSCULAR | Status: AC
  Filled 2025-01-09: qty 2

## 2025-01-09 MED ORDER — VERAPAMIL HCL 2.5 MG/ML IV SOLN
INTRAVENOUS | Status: AC
  Filled 2025-01-09: qty 2

## 2025-01-09 MED ORDER — HEPARIN SODIUM (PORCINE) 1000 UNIT/ML IJ SOLN
INTRAMUSCULAR | Status: AC
  Filled 2025-01-09: qty 20

## 2025-01-09 MED ORDER — CLONAZEPAM 0.5 MG PO TABS
0.5000 mg | ORAL_TABLET | Freq: Two times a day (BID) | ORAL | Status: DC | PRN
  Administered 2025-01-10 – 2025-01-11 (×2): 0.5 mg via ORAL
  Filled 2025-01-09 (×2): qty 1

## 2025-01-09 MED ORDER — INSULIN REGULAR(HUMAN) IN NACL 100-0.9 UT/100ML-% IV SOLN
INTRAVENOUS | Status: DC
  Administered 2025-01-09: 11.5 [IU]/h via INTRAVENOUS
  Filled 2025-01-09: qty 100

## 2025-01-09 MED ORDER — LIDOCAINE HCL (PF) 1 % IJ SOLN
INTRAMUSCULAR | Status: DC | PRN
  Administered 2025-01-09: 5 mL

## 2025-01-09 MED ORDER — INSULIN ASPART 100 UNIT/ML IV SOLN
5.0000 [IU] | Freq: Once | INTRAVENOUS | Status: AC
  Administered 2025-01-09: 5 [IU] via INTRAVENOUS
  Filled 2025-01-09: qty 5

## 2025-01-09 MED ORDER — CLOPIDOGREL BISULFATE 75 MG PO TABS
75.0000 mg | ORAL_TABLET | Freq: Every day | ORAL | Status: AC
  Administered 2025-01-10 – 2025-01-25 (×15): 75 mg via ORAL
  Filled 2025-01-09 (×16): qty 1

## 2025-01-09 MED ORDER — VANCOMYCIN VARIABLE DOSE PER UNSTABLE RENAL FUNCTION (PHARMACIST DOSING): Status: DC

## 2025-01-09 MED ORDER — ASPIRIN 81 MG PO TBEC
81.0000 mg | DELAYED_RELEASE_TABLET | Freq: Every morning | ORAL | Status: DC
  Administered 2025-01-10 – 2025-01-12 (×3): 81 mg via ORAL
  Filled 2025-01-09 (×4): qty 1

## 2025-01-09 MED ORDER — SODIUM CHLORIDE 0.9% FLUSH: 3.0000 mL | INTRAVENOUS | Status: AC | PRN

## 2025-01-09 MED ORDER — BUPROPION HCL ER (XL) 150 MG PO TB24
300.0000 mg | ORAL_TABLET | Freq: Every morning | ORAL | Status: DC
  Administered 2025-01-10: 150 mg via ORAL
  Filled 2025-01-09: qty 1

## 2025-01-09 MED ORDER — HYDRALAZINE HCL 20 MG/ML IJ SOLN: 10.0000 mg | INTRAMUSCULAR | Status: AC | PRN

## 2025-01-09 MED ORDER — MIDAZOLAM HCL 2 MG/2ML IJ SOLN
INTRAMUSCULAR | Status: AC
  Filled 2025-01-09: qty 2

## 2025-01-09 MED ORDER — HEPARIN SODIUM (PORCINE) 5000 UNIT/ML IJ SOLN
INTRAMUSCULAR | Status: AC
  Filled 2025-01-09: qty 1

## 2025-01-09 MED ORDER — FUROSEMIDE 10 MG/ML IJ SOLN: 40.0000 mg | Freq: Once | INTRAMUSCULAR | Status: DC

## 2025-01-09 MED ORDER — SODIUM CHLORIDE 0.9% FLUSH
3.0000 mL | Freq: Two times a day (BID) | INTRAVENOUS | Status: AC
  Administered 2025-01-09 – 2025-01-25 (×13): 3 mL via INTRAVENOUS

## 2025-01-09 MED ORDER — LABETALOL HCL 5 MG/ML IV SOLN: 10.0000 mg | INTRAVENOUS | Status: AC | PRN

## 2025-01-09 MED ORDER — CALCIUM GLUCONATE-NACL 1-0.675 GM/50ML-% IV SOLN
1.0000 g | Freq: Once | INTRAVENOUS | Status: AC
  Administered 2025-01-09: 1000 mg via INTRAVENOUS
  Filled 2025-01-09: qty 50

## 2025-01-09 MED ORDER — ORAL CARE MOUTH RINSE: 15.0000 mL | OROMUCOSAL | Status: AC | PRN

## 2025-01-09 MED ORDER — SODIUM BICARBONATE 8.4 % IV SOLN
50.0000 meq | Freq: Once | INTRAVENOUS | Status: AC
  Administered 2025-01-09: 50 meq via INTRAVENOUS
  Filled 2025-01-09: qty 50

## 2025-01-09 MED ORDER — NITROGLYCERIN 1 MG/10 ML FOR IR/CATH LAB
INTRA_ARTERIAL | Status: AC
  Filled 2025-01-09: qty 10

## 2025-01-09 MED ORDER — HEPARIN SODIUM (PORCINE) 1000 UNIT/ML IJ SOLN
INTRAMUSCULAR | Status: DC | PRN
  Administered 2025-01-09: 16000 [IU] via INTRAVENOUS

## 2025-01-09 MED ORDER — DEXTROSE 50 % IV SOLN: 0.0000 mL | INTRAVENOUS | Status: AC | PRN

## 2025-01-09 MED ORDER — SODIUM CHLORIDE 0.9 % IV BOLUS
500.0000 mL | Freq: Once | INTRAVENOUS | Status: AC
  Administered 2025-01-09: 500 mL via INTRAVENOUS

## 2025-01-09 MED ORDER — DEXTROSE IN LACTATED RINGERS 5 % IV SOLN: INTRAVENOUS | Status: DC

## 2025-01-09 NOTE — Consult Note (Signed)
 "  Cardiology Consultation   Patient ID: Joseph [name] MRN: [id number]; DOB: 1971-10-22  Admit date: 01/09/2025 Date of Consult: 01/09/2025  PCP:  Alec House, MD    [facility] Providers Cardiologist:  Oneil Parchment, MD     History of Present Illness:  Joseph [name] is a 54 yo male with history of CAD s/p 4V CABG, PAD s/p bilateral BKA, DM, HTN, HLD, OSA, morbid obesity, prior DVT/PE who presented to the ED with c/o chest pain for 14 hours. His EKG showed ST elevation in leads V1 and V2 that are grossly unchanged from prior EKG. Given his ongoing chest pain, we elected to perform an emergent cardiac catheterization. Cardiac cath with severe native vessel CAD with 4/4 patent grafts. LVEDP=8-10 mmHg. POC Creatinine 5.3, glucose 450, Potassium 6.7. Case discussed with the Critical care team who will admit him to the ICU.    Past Medical History:  Diagnosis Date   Aneurysm of ascending aorta 09/14/2021   Anxiety    Aortic atherosclerosis 11/28/2023   Arthritis    knees, shoulders, hips, ankles (07/29/2016)   Asthma    Bicuspid aortic valve 09/14/2021   BMI 70 and over, adult (HCC) 02/16/2019   CAD (coronary artery disease)    a. 2017: s/p BMS to distal Cx; b. LHC 05/30/2019: 80% mid, distal RCA s/p DES, 30% narrowing of d LM, widely patent LAD w/ luminal irregularities, widely patent stent in dCX  w/ 90+% stenosis distal to stent beofre small trifurcating obtuse marginal (potentially area of restenosis)   Chronic bronchitis (HCC)    Chronic ulcer of great toe of right foot (HCC) 09/09/2017   Chronic ulcer of right great toe (HCC) 09/19/2017   COPD (chronic obstructive pulmonary disease) (HCC)    Coronary arteriosclerosis    Deep venous thrombosis (HCC) 02/16/2019   Depression    Diabetes mellitus (HCC)    DVT (deep venous thrombosis) (HCC)    GERD (gastroesophageal reflux disease)    Hyperlipidemia    Hypertension    Migraine    Morbid obesity (HCC)    Morbid obesity  with BMI of 70 and over, adult (HCC) 11/28/2023   Myocardial infarction (HCC) 1996   light one   Non-ST elevation (NSTEMI) myocardial infarction (HCC) 07/22/2020   PE (pulmonary embolism) 04/2013   On chronic Xarelto    Peripheral nerve disease    Pneumonia several times   Sleep apnea    Type II diabetes mellitus (HCC)     Past Surgical History:  Procedure Laterality Date   ABDOMINAL AORTOGRAM W/LOWER EXTREMITY N/A 10/10/2020   Procedure: ABDOMINAL AORTOGRAM W/LOWER EXTREMITY;  Surgeon: Harvey Carlin BRAVO, MD;  Location: [facility];  Service: Cardiovascular;  Laterality: N/A;   AMPUTATION Right 09/21/2017   Procedure: RIGHT GREAT TOE AMPUTATION, POSSIBLE VAC;  Surgeon: Jerri Kay HERO, MD;  Location: [facility] OR;  Service: Orthopedics;  Laterality: Right;   AMPUTATION Left 08/13/2020   Procedure: LEFT FOOT 4TH RAY AMPUTATION;  Surgeon: Harden Jerona GAILS, MD;  Location: [facility] OR;  Service: Orthopedics;  Laterality: Left;   AMPUTATION Left 11/20/2021   Procedure: LEFT TRANSMETATARSAL AMPUTATION;  Surgeon: Harden Jerona GAILS, MD;  Location: [facility] OR;  Service: Orthopedics;  Laterality: Left;   AMPUTATION Left 01/08/2022   Procedure: LEFT BELOW KNEE AMPUTATION;  Surgeon: Harden Jerona GAILS, MD;  Location: [facility] OR;  Service: Orthopedics;  Laterality: Left;   AMPUTATION Right 07/07/2022   Procedure: RIGHT BELOW KNEE AMPUTATION;  Surgeon: Harden Jerona GAILS, MD;  Location:  [facility] OR;  Service: Orthopedics;  Laterality: Right;   AORTOGRAM Bilateral 03/13/2021   Procedure: ABDOMINAL AORTOGRAM WITH Left LOWER EXTREMITY RUNOFF;  Surgeon: Magda Debby SAILOR, MD;  Location: [facility] OR;  Service: Vascular;  Laterality: Bilateral;   APPLICATION OF WOUND VAC  01/08/2022   Procedure: APPLICATION OF WOUND VAC;  Surgeon: Harden Jerona GAILS, MD;  Location: [facility] OR;  Service: Orthopedics;;   APPLICATION OF WOUND VAC Right 07/07/2022   Procedure: APPLICATION OF WOUND VAC;  Surgeon: Harden Jerona GAILS, MD;  Location: [facility] OR;  Service: Orthopedics;   Laterality: Right;   CARDIAC CATHETERIZATION  2006   CARDIAC CATHETERIZATION  1996   @ [facility]; when I had my heart attack   CARDIAC CATHETERIZATION N/A 07/29/2016   Procedure: Left Heart Cath and Coronary Angiography;  Surgeon: Dorn JINNY Lesches, MD;  Location: [facility] INVASIVE CV LAB;  Service: Cardiovascular;  Laterality: N/A;   CARDIAC CATHETERIZATION N/A 07/29/2016   Procedure: Coronary Stent Intervention;  Surgeon: Dorn JINNY Lesches, MD;  Location: [facility];  Service: Cardiovascular;  Laterality: N/A;   CARPAL TUNNEL RELEASE Bilateral    CORONARY ANGIOPLASTY WITH STENT PLACEMENT  07/29/2016   CORONARY ARTERY BYPASS GRAFT N/A 07/24/2020   Procedure: CORONARY ARTERY BYPASS GRAFTING (CABG), ON PUMP, TIMES FOUR, USING LEFT INTERNAL MAMMARY ARTERY AND ENDOSCOPICALLY HARVESTED RIGHT GREATER SAPHENOUS VEIN;  Surgeon: Shyrl Linnie KIDD, MD;  Location: [facility] OR;  Service: Open Heart Surgery;  Laterality: N/A;  FLOW TAC   CORONARY PRESSURE/FFR STUDY N/A 07/22/2020   Procedure: INTRAVASCULAR PRESSURE WIRE/FFR STUDY;  Surgeon: Anner Alm ORN, MD;  Location: [facility] INVASIVE CV LAB;  Service: Cardiovascular;  Laterality: N/A;   CORONARY STENT INTERVENTION N/A 05/30/2019   Procedure: CORONARY STENT INTERVENTION;  Surgeon: Claudene Victory ORN, MD;  Location: [facility];  Service: Cardiovascular;  Laterality: N/A;   CORONARY STENT INTERVENTION N/A 11/02/2019   Procedure: CORONARY STENT INTERVENTION;  Surgeon: Darron Deatrice LABOR, MD;  Location: [facility];  Service: Cardiovascular;  Laterality: N/A;   ESOPHAGOGASTRODUODENOSCOPY N/A 09/22/2017   Procedure: ESOPHAGOGASTRODUODENOSCOPY (EGD);  Surgeon: Aneita Gwendlyn DASEN, MD;  Location: [facility] ENDOSCOPY;  Service: Endoscopy;  Laterality: N/A;   KNEE ARTHROSCOPY Bilateral    2 on left; 1 on the right   LEFT HEART CATH AND CORONARY ANGIOGRAPHY N/A 11/02/2019   Procedure: LEFT HEART CATH AND CORONARY ANGIOGRAPHY;  Surgeon: Darron Deatrice LABOR, MD;  [facility]: [facility];  Service: Cardiovascular;  Laterality: N/A;   LEFT HEART CATH AND CORONARY ANGIOGRAPHY N/A 07/22/2020   Procedure: LEFT HEART CATH AND CORONARY ANGIOGRAPHY;  Surgeon: Anner Alm ORN, MD;  Location: [facility] INVASIVE CV LAB;  Service: Cardiovascular;  Laterality: N/A;   LEFT HEART CATH AND CORS/GRAFTS ANGIOGRAPHY N/A 08/17/2021   Procedure: LEFT HEART CATH AND CORS/GRAFTS ANGIOGRAPHY;  Surgeon: Mady Bruckner, MD;  Location: [facility];  Service: Cardiovascular;  Laterality: N/A;   PERIPHERAL VASCULAR INTERVENTION Left 10/11/2020   popliteal and SFA stent placement    PERIPHERAL VASCULAR INTERVENTION Left 10/10/2020   Procedure: PERIPHERAL VASCULAR INTERVENTION;  Surgeon: Harvey Carlin BRAVO, MD;  Location: [facility];  Service: Cardiovascular;  Laterality: Left;   RIGHT/LEFT HEART CATH AND CORONARY ANGIOGRAPHY N/A 05/30/2019   Procedure: RIGHT/LEFT HEART CATH AND CORONARY ANGIOGRAPHY;  Surgeon: Claudene Victory ORN, MD;  Location: [facility];  Service: Cardiovascular;  Laterality: N/A;   RIGHT/LEFT HEART CATH AND CORONARY/GRAFT ANGIOGRAPHY N/A 06/17/2022   Procedure: RIGHT/LEFT HEART CATH AND CORONARY/GRAFT  ANGIOGRAPHY;  Surgeon: Burnard Debby LABOR, MD;  Location: [facility] INVASIVE CV LAB;  Service: Cardiovascular;  Laterality: N/A;   SHOULDER OPEN ROTATOR CUFF REPAIR Bilateral    TEE WITHOUT CARDIOVERSION N/A 07/24/2020   Procedure: TRANSESOPHAGEAL ECHOCARDIOGRAM (TEE);  Surgeon: Shyrl Linnie KIDD, MD;  Location: [facility] OR;  Service: Open Heart Surgery;  Laterality: N/A;       Scheduled Meds:  aspirin        heparin        nitroGLYCERIN        Continuous Infusions:  sodium chloride  20 mL/hr (01/09/25 1918)   PRN Meds: sodium chloride , aspirin , fentaNYL , heparin , heparin  sodium (porcine), iohexol , lidocaine  (PF), midazolam  PF, nitroGLYCERIN   Allergies:   Allergies[1]  Social History:   Social History   Socioeconomic History   Marital status: Married    Spouse name:  Not on file   Number of children: Not on file   Years of education: Not on file   Highest education level: Not on file  Occupational History   Occupation: disabled     Employer: UNEMPLOYED  Tobacco Use   Smoking status: Former    Current packs/day: 0.00    Average packs/day: 1 pack/day for 34.0 years (34.0 ttl pk-yrs)    Types: Cigarettes    Start date: 07/12/1986    Quit date: 07/12/2020    Years since quitting: 4.4   Smokeless tobacco: Former    Types: Snuff, Chew  Vaping Use   Vaping status: Never Used  Substance and Sexual Activity   Alcohol use: Yes    Comment: RARE   Drug use: No   Sexual activity: Yes  Other Topics Concern   Not on file  Social History Narrative   Not on file   Social Drivers of Health   Tobacco Use: Medium Risk (12/31/2024)   Patient History    Smoking Tobacco Use: Former    Smokeless Tobacco Use: Former    Passive Exposure: Not on Actuary Strain: Medium Risk (09/09/2022)   Overall Financial Resource Strain (CARDIA)    Difficulty of Paying Living Expenses: Somewhat hard  Food Insecurity: No Food Insecurity (03/28/2024)   Hunger Vital Sign    Worried About Running Out of Food in the Last Year: Never true    Ran Out of Food in the Last Year: Never true  Transportation Needs: Unmet Transportation Needs (03/28/2024)   PRAPARE - Transportation    Lack of Transportation (Medical): Yes    Lack of Transportation (Non-Medical): Yes  Physical Activity: Not on file  Stress: No Stress Concern Present (10/10/2023)   Received from [facility] - Occupational Stress Questionnaire    Feeling of Stress : Not at all  Social Connections: Moderately Isolated (03/28/2024)   Social Connection and Isolation Panel    Frequency of Communication with Friends and Family: More than three times a week    Frequency of Social Gatherings with Friends and Family: More than three times a week    Attends Religious  Services: Never    Database Administrator or Organizations: No    Attends Banker Meetings: Never    Marital Status: Married  Catering Manager Violence: Not At Risk (03/28/2024)   Humiliation, Afraid, Rape, and Kick questionnaire    Fear of Current or Ex-Partner: No    Emotionally Abused: No    Physically Abused: No    Sexually Abused: No  Depression (PHQ2-9): Not on file  Alcohol Screen: Not  on file  Housing: Low Risk (03/28/2024)   Housing Stability Vital Sign    Unable to Pay for Housing in the Last Year: No    Number of Times Moved in the Last Year: 0    Homeless in the Last Year: No  Utilities: Not At Risk (03/28/2024)   AHC Utilities    Threatened with loss of utilities: No  Health Literacy: Not on file    Family History:   Family History  Problem Relation Age of Onset   Hypertension Brother    Hypertension Father    Diabetes Other    Hyperlipidemia Other      ROS:  Please see the history of present illness.  All other ROS reviewed and negative.     Physical Exam/Data: Vitals:   01/09/25 1901  SpO2: 96%   No intake or output data in the 24 hours ending 01/09/25 1949    12/17/2024    8:56 AM 07/17/2024    3:54 PM 04/27/2024    9:34 AM  Last 3 Weights  Weight (lbs) -- 340 lb 360 lb  Weight (kg) -- 154.223 kg 163.295 kg     There is no height or weight on file to calculate BMI.  General:  Obese male, appears uncomfortable.  HEENT: normal Neck: no JVD Vascular: No carotid bruits; Distal pulses 2+ bilaterally Cardiac:  normal S1, S2; RRR; no murmur  Lungs:  clear to auscultation bilaterally, no wheezing, rhonchi or rales  Abd: soft, nontender, no hepatomegaly  Ext: no edema Musculoskeletal:  No deformities, BUE and BLE strength normal and equal Skin: warm and dry  Neuro:  CNs 2-12 intact, no focal abnormalities noted Psych:  Normal affect   EKG:  The EKG was personally reviewed and demonstrates:  sinus, 1 mm ST elevation V1, V2.  Telemetry:   Telemetry was personally reviewed and demonstrates:  sinus  Relevant CV Studies:   Laboratory Data: High Sensitivity Troponin:  No results for input(s): TROPONINIHS in the last 720 hours.  Recent Labs  Lab 12/18/24 0150 12/18/24 0342  TRNPT 37* 32*      ChemistryNo results for input(s): NA, K, CL, CO2, GLUCOSE, BUN, CREATININE, CALCIUM , MG, GFRNONAA, GFRAA, ANIONGAP in the last 168 hours.  No results for input(s): PROT, ALBUMIN , AST, ALT, ALKPHOS, BILITOT in the last 168 hours. Lipids No results for input(s): CHOL, TRIG, HDL, LABVLDL, LDLCALC, CHOLHDL in the last 168 hours.  HematologyNo results for input(s): WBC, RBC, HGB, HCT, MCV, MCH, MCHC, RDW, PLT in the last 168 hours. Thyroid No results for input(s): TSH, FREET4 in the last 168 hours.  BNPNo results for input(s): BNP, PROBNP in the last 168 hours.  DDimer No results for input(s): DDIMER in the last 168 hours.  Radiology/Studies:  No results found.   Assessment and Plan:  Chest pain in patient with known CAD s/p CABG: His grafts are open. No targets for PCI. I suspect that his chest pain is not cardiac. The differential includes recurrent PE, aortic dissection. He also has a metabolic derangement on POC testing with evidence of renal failure, hyperkalemia and elevated blood glucose. The first labs sent downstairs are pending.  Case discussed with the PCCM team who will admit him and manage him tonight.   For questions or updates, please contact [facility] Please consult [email or web address] for contact info under  Lonni Cash, MD  01/09/2025 7:49 PM     [1]  Allergies Allergen Reactions   Duloxetine Other (See Comments)  Headaches   Sulfa  Antibiotics Other (See Comments)    Headaches    "

## 2025-01-09 NOTE — H&P (Cosign Needed Addendum)
 "  NAME:  Joseph [name], MRN:  [id number], DOB:  [birth year], LOS: 0 ADMISSION DATE:  01/09/2025, CONSULTATION DATE:  1/21 REFERRING MD:  Verlin, cardiology, CHIEF COMPLAINT:  chest pain   History of Present Illness:  54 year old male with past medical history of asthma, COPD, chronic bronchitis, morbid obesity, OSA, diabetes, GERD, HTN, HLD, PE, DVT on Xarelto , PAD s/p bilateral BKA, CAD s/p CABG x4 who initially presented to the emergency department with chest pain.   Complained of chest pain for 14 hours prior to presenation. EKG showing STE in V1 and v2, unchanged from prior. Per cardiology, was screaming in pain in ED, so elected to take him to cath lab. Cath showing x4 patent grafts, LVEDP 8-10. In the interim found to have multiple lab abnormalities and given his medical history CCM consulted for admission.   On my exam he is a bit lethargic, received fentanyl  and versed  in cath lab. His wife showed up during exam. She states he was tired all day yesterday. Did not get out of bed until 5pm. He then was tired again all day today, did not drink much and reportedly more confused this afternoon. States he yelled out in pain about his left shoulder, but has otherwise note had complaints. Additionally, she looked at his glucose monitor which was reading high. Denies having fevers, chills, shortness of breath, abdominal pain, n/v/d, cough at home.   Pertinent  Medical History  asthma, COPD, chronic bronchitis, morbid obesity, OSA, diabetes, GERD, HTN, HLD, PE, DVT on Xarelto , PAD s/p bilateral BKA, CAD s/p CABG x4  Significant Hospital Events: Including procedures, antibiotic start and stop dates in addition to other pertinent events   1/21: ED for CP>cath clean>ICU admit   Interim History / Subjective:  Somewhat confused since sedation for cath lab. Tells me his name and he is in [place]   Objective   Blood pressure (!) 143/87, pulse (!) 106, resp. rate 19, SpO2 96%.       No intake  or output data in the 24 hours ending 01/09/25 1952 There were no vitals filed for this visit.  Examination: General: morbidly obese, acute on chronically ill appearing male HENT: mucous membranes dry, anicteric sclera  Lungs: clear bilaterally,  Cardiovascular: harsh systolic murmur, warm Abdomen: rounded, soft  Extremities: bilateral BKA, right BKA is hot, erythematous  Neuro: slightly drowsy, oriented to self and place  GU: no foley   Resolved Hospital Problem list    Assessment & Plan:  Acute metabolic encephalopathy  Likely mixed uremia, CO2 narcosis, DKA. Also polypharmacy, on xanaflex, lyrica , oxycodone , wellbutrin , xanax   - check DKA labs - VBG now  - check ammonia - non focal, do not feel he needs CT head right now  - correct metabolic derangements, BiPAP   Hyperglycemia c/f DKA  Hyperkalemia  Hyponatremia  - obtain BHB, VBG, UA  - start insulin  gtt if DKA +  - correct K 6.7 w/ insulin /dextrose , sodium bicarb, calcium  gluconate and recheck  - 1L IVF now   AKI on CKD  AGMA  Query dehydration/pre-renal AKI on CKD  - obtain UA, reflex culture and UDS  - consult nephro - 1L IVF now  - trend bmp, mag, phos - replete elytes - strict I&O - Avoid nephrotoxic agents, renally dose medications - ensure adequate renal perfusion   SIRS, query infection from his right leg/stump cellulitis - obtain UA, CXR, blood cultures, covid/flu/rsv, 20 pathogen panel  - 1L IVF, mucous membranes are dry  - empiric  antibiotics with vanc/zosyn    COPD  Chronic bronchitis Asthma  OSA on CPAP  - VBG now for CO2 retention  - BiPAP now  - check ABG after BiPAP for a bit to ensure correcting    CAD s/p CABG x4 Left shoulder pain  Concern this was anginal pain, cath was clear. Query PE but story from wife does not seem to follow this, no increased O2 requirements or hypotension.  Most recent echo 03/2024 with EF 55-60%,, RV normal - cardiology following  - cath clean  - tele monitor   - asa/plavix  cont   History of PE, DVT  - not on home DOAC   Depression  Anxiety   - con't wellbutrin  and lexapro  tomorrow  - prn klonopin    Chronica pain? - hold oxycodone , lyrica , xanaflex for now - add back PRN Labs   CBC: No results for input(s): WBC, NEUTROABS, HGB, HCT, MCV, PLT in the last 168 hours.  Basic Metabolic Panel: No results for input(s): NA, K, CL, CO2, GLUCOSE, BUN, CREATININE, CALCIUM , MG, PHOS in the last 168 hours. GFR: CrCl cannot be calculated (Unknown ideal weight.). No results for input(s): PROCALCITON, WBC, LATICACIDVEN in the last 168 hours.  Liver Function Tests: No results for input(s): AST, ALT, ALKPHOS, BILITOT, PROT, ALBUMIN  in the last 168 hours. No results for input(s): LIPASE, AMYLASE in the last 168 hours. No results for input(s): AMMONIA in the last 168 hours.  ABG    Component Value Date/Time   PHART 7.290 (L) 07/01/2022 0810   PCO2ART 47.5 07/01/2022 0810   PO2ART 84 07/01/2022 0810   HCO3 22.2 01/07/2024 0249   TCO2 24 07/01/2022 0810   ACIDBASEDEF 3.9 (H) 01/07/2024 0249   O2SAT 82.1 01/07/2024 0249     Coagulation Profile: No results for input(s): INR, PROTIME in the last 168 hours.  Cardiac Enzymes: No results for input(s): CKTOTAL, CKMB, CKMBINDEX, TROPONINI in the last 168 hours.  HbA1C: Hgb A1c MFr Bld  Date/Time Value Ref Range Status  01/07/2024 05:35 AM 8.1 (H) 4.8 - 5.6 % Final    Comment:    (NOTE) Pre diabetes:          5.7%-6.4%  Diabetes:              >6.4%  Glycemic control for   <7.0% adults with diabetes   11/27/2023 04:41 AM 8.0 (H) 4.8 - 5.6 % Final    Comment:    (NOTE) Pre diabetes:          5.7%-6.4%  Diabetes:              >6.4%  Glycemic control for   <7.0% adults with diabetes     CBG: No results for input(s): GLUCAP in the last 168 hours.  Review of Systems:   As above   Past Medical History:  He,  has  a past medical history of Aneurysm of ascending aorta (09/14/2021), Anxiety, Aortic atherosclerosis (11/28/2023), Arthritis, Asthma, Bicuspid aortic valve (09/14/2021), BMI 70 and over, adult [facility]) (02/16/2019), CAD (coronary artery disease), Chronic bronchitis (HCC), Chronic ulcer of great toe of right foot (HCC) (09/09/2017), Chronic ulcer of right great toe (HCC) (09/19/2017), COPD (chronic obstructive pulmonary disease) (HCC), Coronary arteriosclerosis, Deep venous thrombosis (HCC) (02/16/2019), Depression, Diabetes mellitus (HCC), DVT (deep venous thrombosis) (HCC), GERD (gastroesophageal reflux disease), Hyperlipidemia, Hypertension, Migraine, Morbid obesity (HCC), Morbid obesity with BMI of 70 and over, adult (HCC) (11/28/2023), Myocardial infarction (HCC) (1996), Non-ST elevation (NSTEMI) myocardial infarction (HCC) (07/22/2020), PE (pulmonary embolism) (  04/2013), Peripheral nerve disease, Pneumonia (several times), Sleep apnea, and Type II diabetes mellitus (HCC).   Surgical History:   Past Surgical History:  Procedure Laterality Date   ABDOMINAL AORTOGRAM W/LOWER EXTREMITY N/A 10/10/2020   Procedure: ABDOMINAL AORTOGRAM W/LOWER EXTREMITY;  Surgeon: Harvey Carlin BRAVO, MD;  Location: [facility] INVASIVE CV LAB;  Service: Cardiovascular;  Laterality: N/A;   AMPUTATION Right 09/21/2017   Procedure: RIGHT GREAT TOE AMPUTATION, POSSIBLE VAC;  Surgeon: Jerri Kay HERO, MD;  Location: [facility] OR;  Service: Orthopedics;  Laterality: Right;   AMPUTATION Left 08/13/2020   Procedure: LEFT FOOT 4TH RAY AMPUTATION;  Surgeon: Harden Jerona GAILS, MD;  Location: [facility] OR;  Service: Orthopedics;  Laterality: Left;   AMPUTATION Left 11/20/2021   Procedure: LEFT TRANSMETATARSAL AMPUTATION;  Surgeon: Harden Jerona GAILS, MD;  Location: [facility] OR;  Service: Orthopedics;  Laterality: Left;   AMPUTATION Left 01/08/2022   Procedure: LEFT BELOW KNEE AMPUTATION;  Surgeon: Harden Jerona GAILS, MD;  Location: [facility] OR;  Service: Orthopedics;  Laterality: Left;    AMPUTATION Right 07/07/2022   Procedure: RIGHT BELOW KNEE AMPUTATION;  Surgeon: Harden Jerona GAILS, MD;  Location: [facility] OR;  Service: Orthopedics;  Laterality: Right;   AORTOGRAM Bilateral 03/13/2021   Procedure: ABDOMINAL AORTOGRAM WITH Left LOWER EXTREMITY RUNOFF;  Surgeon: Magda Debby SAILOR, MD;  Location: [facility] OR;  Service: Vascular;  Laterality: Bilateral;   APPLICATION OF WOUND VAC  01/08/2022   Procedure: APPLICATION OF WOUND VAC;  Surgeon: Harden Jerona GAILS, MD;  Location: [facility] OR;  Service: Orthopedics;;   APPLICATION OF WOUND VAC Right 07/07/2022   Procedure: APPLICATION OF WOUND VAC;  Surgeon: Harden Jerona GAILS, MD;  Location: [facility] OR;  Service: Orthopedics;  Laterality: Right;   CARDIAC CATHETERIZATION  2006   CARDIAC CATHETERIZATION  1996   @ [facility]; when I had my heart attack   CARDIAC CATHETERIZATION N/A 07/29/2016   Procedure: Left Heart Cath and Coronary Angiography;  Surgeon: Dorn JINNY Lesches, MD;  Location: [facility] INVASIVE CV LAB;  Service: Cardiovascular;  Laterality: N/A;   CARDIAC CATHETERIZATION N/A 07/29/2016   Procedure: Coronary Stent Intervention;  Surgeon: Dorn JINNY Lesches, MD;  Location: [facility] LAB;  Service: Cardiovascular;  Laterality: N/A;   CARPAL TUNNEL RELEASE Bilateral    CORONARY ANGIOPLASTY WITH STENT PLACEMENT  07/29/2016   CORONARY ARTERY BYPASS GRAFT N/A 07/24/2020   Procedure: CORONARY ARTERY BYPASS GRAFTING (CABG), ON PUMP, TIMES FOUR, USING LEFT INTERNAL MAMMARY ARTERY AND ENDOSCOPICALLY HARVESTED RIGHT GREATER SAPHENOUS VEIN;  Surgeon: Shyrl Linnie KIDD, MD;  Location: [facility] OR;  Service: Open Heart Surgery;  Laterality: N/A;  FLOW TAC   CORONARY PRESSURE/FFR STUDY N/A 07/22/2020   Procedure: INTRAVASCULAR PRESSURE WIRE/FFR STUDY;  Surgeon: Anner Alm ORN, MD;  Location: [facility] INVASIVE CV LAB;  Service: Cardiovascular;  Laterality: N/A;   CORONARY STENT INTERVENTION N/A 05/30/2019   Procedure: CORONARY STENT INTERVENTION;  Surgeon: Claudene Victory ORN, MD;  [facility] INV[facility]  [facility] LAB;  Service: Cardiovascular;  Laterality: N/A;   CORONARY STENT INTERVENTION N/A 11/02/2019   Procedure: CORONARY STENT INTERVENTION;  Surgeon: Darron Deatrice LABOR, MD;  [facility]: [facility];  Service: Cardiovascular;  Laterality: N/A;   ESOPHAGOGASTRODUODENOSCOPY N/A 09/22/2017   Procedure: ESOPHAGOGASTRODUODENOSCOPY (EGD);  Surgeon: Aneita Gwendlyn DASEN, MD;  Location: [facility] ENDOSCOPY;  Service: Endoscopy;  Laterality: N/A;   KNEE ARTHROSCOPY Bilateral    2 on left; 1 on the right   LEFT HEART CATH AND CORONARY ANGIOGRAPHY N/A 11/02/2019   Procedure: LEFT HEART CATH  AND CORONARY ANGIOGRAPHY;  Surgeon: Darron Deatrice LABOR, MD;  [facility]: [facility];  Service: Cardiovascular;  Laterality: N/A;   LEFT HEART CATH AND CORONARY ANGIOGRAPHY N/A 07/22/2020   Procedure: LEFT HEART CATH AND CORONARY ANGIOGRAPHY;  Surgeon: Anner Alm ORN, MD;  Location: [facility] INVASIVE CV LAB;  Service: Cardiovascular;  Laterality: N/A;   LEFT HEART CATH AND CORS/GRAFTS ANGIOGRAPHY N/A 08/17/2021   Procedure: LEFT HEART CATH AND CORS/GRAFTS ANGIOGRAPHY;  Surgeon: Mady Bruckner, MD;  [facility]: [facility] INVAS[facility] CV LAB;  Service: Cardiovascular;  Laterality: N/A;   PERIPHERAL VASCULAR INTERVENTION Left 10/11/2020   popliteal and SFA stent placement    PERIPHERAL VASCULAR INTERVENTION Left 10/10/2020   Procedure: PERIPHERAL VASCULAR INTERVENTION;  Surgeon: Harvey Carlin BRAVO, MD;  [facility]: [facility];  Service: Cardiovascular;  Laterality: Left;   RIGHT/LEFT HEART CATH AND CORONARY ANGIOGRAPHY N/A 05/30/2019   Procedure: RIGHT/LEFT HEART CATH AND CORONARY ANGIOGRAPHY;  Surgeon: Claudene Victory ORN, MD;  [facility]: [facility];  Service: Cardiovascular;  Laterality: N/A;   RIGHT/LEFT HEART CATH AND CORONARY/GRAFT ANGIOGRAPHY N/A 06/17/2022   Procedure: RIGHT/LEFT HEART CATH AND CORONARY/GRAFT ANGIOGRAPHY;  Surgeon: Burnard Debby LABOR, MD;  Location: [facility];  Service: Cardiovascular;  Laterality: N/A;    SHOULDER OPEN ROTATOR CUFF REPAIR Bilateral    TEE WITHOUT CARDIOVERSION N/A 07/24/2020   Procedure: TRANSESOPHAGEAL ECHOCARDIOGRAM (TEE);  Surgeon: Shyrl Linnie KIDD, MD;  Location: [facility] OR;  Service: Open Heart Surgery;  Laterality: N/A;     Social History:   reports that he quit smoking about 4 years ago. His smoking use included cigarettes. He started smoking about 38 years ago. He has a 34 pack-year smoking history. He has quit using smokeless tobacco.  His smokeless tobacco use included snuff and chew. He reports current alcohol use. He reports that he does not use drugs.   Family History:  His family history includes Diabetes in an other family member; Hyperlipidemia in an other family member; Hypertension in his brother and father.   Allergies Allergies[1]   Home Medications  Prior to Admission medications  Medication Sig Start Date End Date Taking? Authorizing Provider  acetaminophen  (TYLENOL ) 500 MG tablet Take 1,000 mg by mouth 3 (three) times daily as needed for headache.    [provider]  albuterol  (VENTOLIN  HFA) 108 (90 Base) MCG/ACT inhaler Inhale 2 puffs into the lungs every 6 (six) hours as needed for shortness of breath.    [provider]  alprazolam  (XANAX ) 2 MG tablet Take 1 tablet (2 mg total) by mouth 2 (two) times daily. 01/01/25     aspirin  EC 81 MG tablet Take 81 mg by mouth in the morning. Swallow whole. Patient taking differently: Take 81 mg by mouth daily. Swallow whole.    [provider]  atorvastatin  (LIPITOR ) 80 MG tablet Take 1 tablet (80 mg total) by mouth daily. 04/27/24   Wyn Jackee VEAR Mickey., NP  buPROPion  (WELLBUTRIN  XL) 300 MG 24 hr tablet Take 1 tablet (300 mg total) by mouth every morning. 11/07/24     cephALEXin  (KEFLEX ) 500 MG capsule Take 1 capsule (500 mg total) by mouth 2 (two) times daily for 10 days. 05/10/24     clopidogrel  (PLAVIX ) 75 MG tablet Take 1 tablet (75 mg total) by mouth daily. 05/10/24   Wyn Jackee VEAR Mickey., NP   Continuous Glucose Sensor (FREESTYLE LIBRE 3 PLUS SENSOR) MISC Use as directed to monitor blood sugar. Change every 15 days. 04/19/24  escitalopram  (LEXAPRO ) 10 MG tablet Take 10 mg by mouth daily. 01/27/22   [provider]  fluticasone  (FLONASE ) 50 MCG/ACT nasal spray Place 2 sprays into both nostrils daily.    [provider]  fluticasone  (FLONASE ) 50 MCG/ACT nasal spray Place 1 spray into both nostrils 2 (two) times daily. 07/27/24     furosemide  (LASIX ) 80 MG tablet Take 1 tablet (80 mg total) by mouth 2 (two) times daily. 04/27/24   Wyn Jackee VEAR Mickey., NP  icosapent  Ethyl (VASCEPA ) 1 g capsule Take 2 capsules (2 g total) by mouth 2 (two) times daily. 12/19/24   Jeffrie Oneil BROCKS, MD  insulin  aspart (NOVOLOG ) 100 UNIT/ML injection Inject 8 Units into the skin 3 (three) times daily with meals. Hold if eats less than 50% of meals 01/14/24   Jillian Buttery, MD  insulin  glargine (LANTUS  SOLOSTAR) 100 UNIT/ML Solostar Pen Inject 38 Units into the skin 2 (two) times daily. Patient taking differently: Inject 38-45 Units into the skin 2 (two) times daily. Takes 50 units twice daily 01/14/24   Jillian Buttery, MD  insulin  lispro (HUMALOG  KWIKPEN) 100 UNIT/ML KwikPen Inject 12 Units into the skin 3 (three) times daily before meals. Can take up to 45 units per day. 10/30/24     isosorbide  mononitrate (IMDUR ) 30 MG 24 hr tablet Take 1/2 tablet (15 mg total) by mouth daily. 12/17/24   Campbell, Kenzie E, NP  ketotifen  (ZADITOR ) 0.035 % ophthalmic solution Place 1 drop into the left eye 2 (two) times daily. 12/03/23   Tobie Yetta HERO, MD  lidocaine  (LIDODERM ) 5 % Place 1 patch to each stump once daily, remove after 12 hours. 04/19/24     loratadine  (CLARITIN ) 10 MG tablet Take 10 mg by mouth daily. 09/27/22   [provider]  loratadine  (CLARITIN ) 10 MG tablet Take 1 tablet (10 mg total) by mouth daily. 07/27/24     losartan -hydrochlorothiazide  (HYZAAR ) 100-25 MG tablet Take 1 tablet by mouth  daily. 12/04/24     midodrine  (PROAMATINE ) 5 MG tablet Take 1 tablet (5 mg total) by mouth 2 (two) times daily with a meal. 04/27/24   Wyn Jackee VEAR Mickey., NP  naloxone  (NARCAN ) nasal spray 4 mg/0.1 mL Place 4 mg into the nose as needed (overdose).    [provider]  naloxone  (NARCAN ) nasal spray 4 mg/0.1 mL Place 1 spray into the nose as needed and seek medical follow-up. 06/13/24     nitroGLYCERIN  (NITROSTAT ) 0.4 MG SL tablet Place 1 tablet (0.4 mg total) under the tongue every 5 (five) minutes as needed. 12/17/24   Campbell, Kenzie E, NP  ondansetron  (ZOFRAN -ODT) 8 MG disintegrating tablet Place 1 tablet on the tongue and allow to dissolve once daily as needed. 12/06/24     oxyCODONE  (ROXICODONE ) 15 MG immediate release tablet Take 1 tablet (15 mg total) by mouth 4 (four) to 5 (five) times daily. 12/06/24     oxyCODONE  (ROXICODONE ) 15 MG immediate release tablet Take 1 tablet (15 mg total) by mouth 4 to 5 times daily as needed. 01/01/25     pantoprazole  (PROTONIX ) 40 MG tablet Take 1 tablet (40 mg total) by mouth daily. 03/26/24     pantoprazole  (PROTONIX ) 40 MG tablet Take 1 tablet (40 mg total) by mouth 2 (two) times daily. 07/09/24     prednisoLONE  acetate (PRED FORTE ) 1 % ophthalmic suspension Place 1 drop into the left eye 4 (four) times daily. 02/19/24   [provider]  pregabalin  (LYRICA ) 100 MG capsule  Take 1 capsule (100 mg total) by mouth 2 (two) times daily. 04/06/24   Arrien, Elidia Sieving, MD  pregabalin  (LYRICA ) 150 MG capsule Take 2 capsules (300 mg total) by mouth 2 (two) times daily. 11/07/24     pregabalin  (LYRICA ) 150 MG capsule Take 2 capsules (300 mg total) by mouth 2 (two) times daily. 12/06/24     pregabalin  (LYRICA ) 150 MG capsule Take 2 capsules (300 mg total) by mouth 2 (two) times daily. 01/01/25     promethazine  (PHENERGAN ) 25 MG tablet Take 25 mg by mouth every 4 (four) hours as needed for nausea or vomiting. 09/10/22   [provider]  ranolazine   (RANEXA ) 1000 MG SR tablet TAKE 1 TABLET(1000 MG) BY MOUTH TWICE DAILY 10/29/24   Jeffrie Oneil BROCKS, MD  silver  sulfADIAZINE  (SILVADENE ) 1 % cream Apply 1 Application topically daily until healed. 05/10/24     tiZANidine  (ZANAFLEX ) 4 MG tablet Take 4 mg by mouth 3 (three) times daily. 11/25/23   [provider]     Critical care time: 52   The patient is critically ill with multiple organ system failure and requires high complexity decision making for assessment and support, frequent evaluation and titration of therapies, advanced monitoring, review of radiographic studies and interpretation of complex data.    Critical Care Time devoted to patient care services, exclusive of separately billable procedures, described in this note is 60   Tinnie FORBES Adolph DEVONNA  Pulmonary & Critical Care 01/09/25 9:36 PM  Please see [email or web address] for pager details. From 7A-7P if no response, please call [phone]          [1]  Allergies Allergen Reactions   Duloxetine Other (See Comments)    Headaches   Sulfa  Antibiotics Other (See Comments)    Headaches    "

## 2025-01-09 NOTE — ED Provider Notes (Signed)
 " [facility] [facility] Provider Note   CSN: [id number] Arrival date & time: 01/09/25  8146     Patient presents with: No chief complaint on file.   Joseph [name] is a 54 y.o. male.   The history is provided by the patient, the EMS personnel and medical records. No language interpreter was used.  Chest Pain Pain location:  L chest and substernal area Pain quality: aching, crushing, dull and pressure   Pain radiates to:  L shoulder Pain severity:  Severe Onset quality:  Gradual Duration:  14 hours Timing:  Constant Progression:  Waxing and waning Chronicity:  Recurrent Relieved by:  Nothing Worsened by:  Nothing Ineffective treatments:  None tried Associated symptoms: diaphoresis, fatigue, nausea, shortness of breath and vomiting   Associated symptoms: no abdominal pain, no altered mental status, no back pain, no cough, no headache, no lower extremity edema, no near-syncope and no palpitations   Risk factors: coronary artery disease        Prior to Admission medications  Medication Sig Start Date End Date Taking? Authorizing Provider  acetaminophen  (TYLENOL ) 500 MG tablet Take 1,000 mg by mouth 3 (three) times daily as needed for headache.    [provider]  albuterol  (VENTOLIN  HFA) 108 (90 Base) MCG/ACT inhaler Inhale 2 puffs into the lungs every 6 (six) hours as needed for shortness of breath.    [provider]  alprazolam  (XANAX ) 2 MG tablet Take 1 tablet (2 mg total) by mouth 2 (two) times daily. 01/01/25     aspirin  EC 81 MG tablet Take 81 mg by mouth in the morning. Swallow whole. Patient taking differently: Take 81 mg by mouth daily. Swallow whole.    [provider]  atorvastatin  (LIPITOR ) 80 MG tablet Take 1 tablet (80 mg total) by mouth daily. 04/27/24   Wyn Jackee VEAR Mickey., NP  buPROPion  (WELLBUTRIN  XL) 300 MG 24 hr tablet Take 1 tablet (300 mg total) by mouth every morning. 11/07/24     cephALEXin   (KEFLEX ) 500 MG capsule Take 1 capsule (500 mg total) by mouth 2 (two) times daily for 10 days. 05/10/24     clopidogrel  (PLAVIX ) 75 MG tablet Take 1 tablet (75 mg total) by mouth daily. 05/10/24   Wyn Jackee VEAR Mickey., NP  Continuous Glucose Sensor (FREESTYLE LIBRE 3 PLUS SENSOR) MISC Use as directed to monitor blood sugar. Change every 15 days. 04/19/24     escitalopram  (LEXAPRO ) 10 MG tablet Take 10 mg by mouth daily. 01/27/22   [provider]  fluticasone  (FLONASE ) 50 MCG/ACT nasal spray Place 2 sprays into both nostrils daily.    [provider]  fluticasone  (FLONASE ) 50 MCG/ACT nasal spray Place 1 spray into both nostrils 2 (two) times daily. 07/27/24     furosemide  (LASIX ) 80 MG tablet Take 1 tablet (80 mg total) by mouth 2 (two) times daily. 04/27/24   Wyn Jackee VEAR Mickey., NP  icosapent  Ethyl (VASCEPA ) 1 g capsule Take 2 capsules (2 g total) by mouth 2 (two) times daily. 12/19/24   Jeffrie Oneil BROCKS, MD  insulin  aspart (NOVOLOG ) 100 UNIT/ML injection Inject 8 Units into the skin 3 (three) times daily with meals. Hold if eats less than 50% of meals 01/14/24   Jillian Buttery, MD  insulin  glargine (LANTUS  SOLOSTAR) 100 UNIT/ML Solostar Pen Inject 38 Units into the skin 2 (two) times daily. Patient taking differently: Inject 38-45 Units into the skin 2 (two) times daily. Takes 50  units twice daily 01/14/24   Jillian Buttery, MD  insulin  lispro (HUMALOG  KWIKPEN) 100 UNIT/ML KwikPen Inject 12 Units into the skin 3 (three) times daily before meals. Can take up to 45 units per day. 10/30/24     isosorbide  mononitrate (IMDUR ) 30 MG 24 hr tablet Take 1/2 tablet (15 mg total) by mouth daily. 12/17/24   Campbell, Kenzie E, NP  ketotifen  (ZADITOR ) 0.035 % ophthalmic solution Place 1 drop into the left eye 2 (two) times daily. 12/03/23   Tobie Yetta HERO, MD  lidocaine  (LIDODERM ) 5 % Place 1 patch to each stump once daily, remove after 12 hours. 04/19/24     loratadine  (CLARITIN ) 10 MG tablet Take 10 mg by  mouth daily. 09/27/22   [provider]  loratadine  (CLARITIN ) 10 MG tablet Take 1 tablet (10 mg total) by mouth daily. 07/27/24     losartan -hydrochlorothiazide  (HYZAAR ) 100-25 MG tablet Take 1 tablet by mouth daily. 12/04/24     midodrine  (PROAMATINE ) 5 MG tablet Take 1 tablet (5 mg total) by mouth 2 (two) times daily with a meal. 04/27/24   Wyn Jackee VEAR Mickey., NP  naloxone  (NARCAN ) nasal spray 4 mg/0.1 mL Place 4 mg into the nose as needed (overdose).    [provider]  naloxone  (NARCAN ) nasal spray 4 mg/0.1 mL Place 1 spray into the nose as needed and seek medical follow-up. 06/13/24     nitroGLYCERIN  (NITROSTAT ) 0.4 MG SL tablet Place 1 tablet (0.4 mg total) under the tongue every 5 (five) minutes as needed. 12/17/24   Campbell, Kenzie E, NP  ondansetron  (ZOFRAN -ODT) 8 MG disintegrating tablet Place 1 tablet on the tongue and allow to dissolve once daily as needed. 12/06/24     oxyCODONE  (ROXICODONE ) 15 MG immediate release tablet Take 1 tablet (15 mg total) by mouth 4 (four) to 5 (five) times daily. 12/06/24     oxyCODONE  (ROXICODONE ) 15 MG immediate release tablet Take 1 tablet (15 mg total) by mouth 4 to 5 times daily as needed. 01/01/25     pantoprazole  (PROTONIX ) 40 MG tablet Take 1 tablet (40 mg total) by mouth daily. 03/26/24     pantoprazole  (PROTONIX ) 40 MG tablet Take 1 tablet (40 mg total) by mouth 2 (two) times daily. 07/09/24     prednisoLONE  acetate (PRED FORTE ) 1 % ophthalmic suspension Place 1 drop into the left eye 4 (four) times daily. 02/19/24   [provider]  pregabalin  (LYRICA ) 100 MG capsule Take 1 capsule (100 mg total) by mouth 2 (two) times daily. 04/06/24   Arrien, Elidia Sieving, MD  pregabalin  (LYRICA ) 150 MG capsule Take 2 capsules (300 mg total) by mouth 2 (two) times daily. 11/07/24     pregabalin  (LYRICA ) 150 MG capsule Take 2 capsules (300 mg total) by mouth 2 (two) times daily. 12/06/24     pregabalin  (LYRICA ) 150 MG capsule Take 2 capsules  (300 mg total) by mouth 2 (two) times daily. 01/01/25     promethazine  (PHENERGAN ) 25 MG tablet Take 25 mg by mouth every 4 (four) hours as needed for nausea or vomiting. 09/10/22   [provider]  ranolazine  (RANEXA ) 1000 MG SR tablet TAKE 1 TABLET(1000 MG) BY MOUTH TWICE DAILY 10/29/24   Jeffrie Oneil BROCKS, MD  silver  sulfADIAZINE  (SILVADENE ) 1 % cream Apply 1 Application topically daily until healed. 05/10/24     tiZANidine  (ZANAFLEX ) 4 MG tablet Take 4 mg by mouth 3 (three) times daily. 11/25/23   [provider]  Allergies: Duloxetine and Sulfa  antibiotics    Review of Systems  Constitutional:  Positive for diaphoresis and fatigue. Negative for chills.  HENT:  Negative for congestion.   Respiratory:  Positive for chest tightness and shortness of breath. Negative for cough.   Cardiovascular:  Positive for chest pain. Negative for palpitations and near-syncope.  Gastrointestinal:  Positive for nausea and vomiting. Negative for abdominal pain, constipation and diarrhea.  Genitourinary:  Negative for dysuria and flank pain.  Musculoskeletal:  Negative for back pain.  Skin:  Negative for rash and wound.  Neurological:  Negative for headaches.  Psychiatric/Behavioral:  Negative for agitation.   All other systems reviewed and are negative.   Updated Vital Signs There were no vitals taken for this visit.  Physical Exam Vitals and nursing note reviewed.  Constitutional:      General: He is not in acute distress.    Appearance: He is well-developed. He is not ill-appearing, toxic-appearing or diaphoretic.  HENT:     Head: Normocephalic and atraumatic.     Nose: No congestion or rhinorrhea.     Mouth/Throat:     Mouth: Mucous membranes are moist.  Eyes:     Conjunctiva/sclera: Conjunctivae normal.  Cardiovascular:     Rate and Rhythm: Normal rate and regular rhythm.     Pulses: Normal pulses.     Heart sounds: Murmur heard.  Pulmonary:     Effort: Pulmonary effort is  normal. No respiratory distress.     Breath sounds: Normal breath sounds. No wheezing, rhonchi or rales.  Chest:     Chest wall: No tenderness.  Abdominal:     General: Abdomen is flat.     Palpations: Abdomen is soft.     Tenderness: There is no abdominal tenderness.  Musculoskeletal:        General: No swelling or tenderness.     Cervical back: Neck supple. No tenderness.     Comments: Bilateral BKA  Skin:    General: Skin is warm and dry.     Capillary Refill: Capillary refill takes less than 2 seconds.     Findings: No erythema.  Neurological:     General: No focal deficit present.     Mental Status: He is alert.  Psychiatric:        Mood and Affect: Mood normal.     (all labs ordered are listed, but only abnormal results are displayed) Labs Reviewed  HEMOGLOBIN A1C  CBC WITH DIFFERENTIAL/PLATELET  PROTIME-INR  APTT  COMPREHENSIVE METABOLIC PANEL WITH GFR  LIPID PANEL  LACTIC ACID, PLASMA  TROPONIN T, HIGH SENSITIVITY    EKG: None  Radiology: No results found.   Procedures   CRITICAL CARE Performed by: Lonni PARAS Izel Eisenhardt Total critical care time: 20 minutes Critical care time was exclusive of separately billable procedures and treating other patients. Critical care was necessary to treat or prevent imminent or life-threatening deterioration. Critical care was time spent personally by me on the following activities: development of treatment plan with patient and/or surrogate as well as nursing, discussions with consultants, evaluation of patient's response to treatment, examination of patient, obtaining history from patient or surrogate, ordering and performing treatments and interventions, ordering and review of laboratory studies, ordering and review of radiographic studies, pulse oximetry and re-evaluation of patient's condition.  Medications Ordered in the ED  aspirin  325 MG tablet (has no administration in time range)  heparin  5000 UNIT/ML injection  (has no administration in time range)  Medical Decision Making   Virginio Isidore Mill is a 54 y.o. male with a past medical history significant for CAD, diabetes, hypertension, aortic aneurysm, previous pulmonary embolism, previous DVT, GERD, sleep apnea, COPD, obesity, and bilateral leg amputations who presents as a code STEMI.  According to patient, this morning he started having significant chest pain that feels different than previous chronic chest pain.  Reports it is left central chest that is pressure crushing and aching and goes to his left shoulder.  He does report diaphoresis and some shortness of breath as well as some nausea and vomiting.  Denies trauma.  Denies any other preceding symptoms.  Reports the pain is still severe.  He got aspirin  with EMS.  EKG during transport is concerning for STEMI so STEMI was activated by them.  On arrival EKG appears similar to what he has had in the past however cardiology came to the bedside and will take him to the Cath Lab based on his story.  On my exam, lungs were clear.  Chest was nontender but I did hear a murmur.  Abdomen nontender.  He has bilateral amputations in the legs and some bandaging on them.  He is ill-appearing.  Per cardiology, he will go straight to the Cath Lab for further management.  Patient taken to Cath Lab to be admitted by cardiology after with concern for possible STEMI.          Final diagnoses:  ST elevation myocardial infarction (STEMI), unspecified artery (HCC)  Chest pain of uncertain etiology    ED Discharge Orders     None      Clinical Impression: 1. ST elevation myocardial infarction (STEMI), unspecified artery (HCC)   2. Chest pain of uncertain etiology     Disposition: Admit  This note was prepared with assistance of Dragon voice recognition software. Occasional wrong-word or sound-a-like substitutions may have occurred due to the inherent limitations of voice  recognition software.      Marene Gilliam, Lonni PARAS, MD 01/09/25 1903  "

## 2025-01-09 NOTE — Progress Notes (Signed)
 PHARMACY ANTIBIOTIC CONSULT NOTE   Joseph [name] a 54 y.o. male admitted on 1/21 as code STEMI s/p cath with no targets, iSTAT labs with hyperkalemia (K 6.7), hyperglycemia with possible DKA (glucose 452, BHB/lactate pending) and AKI (POC Cr 5.3, baseline ~2.3).  PMH significant for bilateral leg amputations - on presentation, R groin/groin red and hot to touch with concern for cellulitis vs sepsis.  Pharmacy has been consulted for Vancomycin  and Zosyn  dosing.  Of note, at 12/31/24 office visit - two ulcers on left stump and one ulcer on right stump were noted - no cellulitis or active drainage per vascular note.  01/09/25 - Scr 4.74, WBC 14  Estimated Creatinine Clearance: 25.9 mL/min (A) (by C-G formula based on SCr of 4.74 mg/dL (H)).  Plan: START Zosyn  3.375 g IV Q8H (EI)  GIVE Vancomycin  2500 mg IV x1, then variable dosing based on levels/renal function  Monitor renal function, clinical status, de-escalation, C/S, levels as indicated   Allergies:  Allergies[1]  Filed Weights   01/09/25 2200  Weight: (!) 170 kg (374 lb 12.5 oz)       Latest Ref Rng & Units 01/09/2025    7:20 PM 12/18/2024    1:50 AM 12/17/2024   10:56 AM  CBC  WBC 4.0 - 10.5 K/uL 14.0  7.1  8.1   Hemoglobin 13.0 - 17.0 g/dL 89.9  88.2  87.8   Hematocrit 39.0 - 52.0 % 31.2  36.9  38.6   Platelets 150 - 400 K/uL 299  216  241     Antibiotics Given (last 72 hours)     None       Antimicrobials this admission: Zosyn  1/21 > Vanc 1/21 >   Microbiology results: 1/21 Bcx: sent 1/21 MRSA PCR: sent   Thank you for allowing pharmacy to be a part of this patients care.  Maurilio Fila, PharmD Clinical Pharmacist 01/09/2025  10:15 PM      [1]  Allergies Allergen Reactions   Duloxetine Other (See Comments)    Headaches   Sulfa  Antibiotics Other (See Comments)    Headaches

## 2025-01-09 NOTE — Consult Note (Signed)
 Reason for Consult: Renal failure  Chief Complaint: chest pain  Assessment/Plan: Acute kidney injury on CKDIIIb with baseline cr in the 2.2-2.5 range -significant PVR with 700 mL return leading to Foley catheter placement with another 300 cc of urine output.  Kidney injury possibly secondary to obstructive uropathy plus polypharmacy but may be complicated by CIN which will become evident in the next 24 hours. - Hold metformin , Lyrica , Zanaflex , Hyzaar   - Medical therapy for hyperkalemia; K already starting to come down with initial therapy. - Good uop after foley placement; can give isotonic fluids with Lasix  to increase distal sodium potassium exchange - Will follow closely with you and adjust therapy pending response.  -Monitor Daily I/Os, Daily weight  -Maintain MAP>65 for optimal renal perfusion.  - Avoid nephrotoxic agents such as IV contrast, NSAIDs, and phosphate containing bowel preps (FLEETS) - Medications for GFR less than 15 cc/min  Chest pain - not thought to be secondary to angina; all 4 graft patent; has h/o PE/DVT Hyperglycemia being treated with insulin  gtt OSA being treated with BiPAP CASHD s/p CABG x4 s/p cardiac cath with all 4 grafts noted to be patent AMS possibly secondary to sedation with cath but also on numerous medications for pain + muscle relaxant with possible uremic encephalopathy as well.   HPI: Joseph [name] is an 54 y.o. male with a history of hypertension, hyperlipidemia, diabetes, coronary atherosclerotic heart disease status post four-vessel CABG, PAD status post bilateral BKA, OSA, COPD, morbid obesity, prior DVT/PE presenting with left-sided substernal chest pain described as crushing, pressure-like with radiation to the shoulder for approximately 14 hours prior to presentation to the emergency room associate with diaphoresis, nausea, shortness of breath and vomiting.  Given ST elevation in leads V1 and V2 strong cardiac history with prior CABG patient  was taken to the Cath Lab which revealed patent 4/4 grafts.  Patient noted to be hyperkalemic initially with a potassium of 6.7 which improved to 5.9, BUN/creatinine 92/4.74 with a sodium initially 127 improving to 131, albumin  3, hemoglobin 10 with a white count of 14.  Chest x-ray showed vascular congestion and bibasilar atelectasis with no evidence of any infiltrates.  ROS Pertinent items are noted in HPI.  Chemistry and CBC: Creat  Date/Time Value Ref Range Status  09/29/2016 11:34 AM 1.21 0.60 - 1.35 mg/dL Final   Creatinine, Ser  Date/Time Value Ref Range Status  01/09/2025 07:20 PM 4.74 (H) 0.61 - 1.24 mg/dL Final  98/90/7973 96:52 PM 2.76 (H) 0.76 - 1.27 mg/dL Final  87/69/7974 98:49 AM 3.05 (H) 0.61 - 1.24 mg/dL Final  87/70/7974 89:43 AM 3.09 (H) 0.76 - 1.27 mg/dL Final  92/70/7974 95:90 PM 2.59 (H) 0.61 - 1.24 mg/dL Final  94/90/7974 89:48 AM 1.61 (H) 0.76 - 1.27 mg/dL Final  95/81/7974 97:47 AM 2.44 (H) 0.61 - 1.24 mg/dL Final  95/82/7974 96:93 AM 2.34 (H) 0.61 - 1.24 mg/dL Final  95/83/7974 91:84 AM 2.30 (H) 0.61 - 1.24 mg/dL Final  95/84/7974 96:59 AM 2.32 (H) 0.61 - 1.24 mg/dL Final  95/85/7974 96:84 AM 2.44 (H) 0.61 - 1.24 mg/dL Final  95/86/7974 96:84 AM 2.35 (H) 0.61 - 1.24 mg/dL Final  95/87/7974 96:75 AM 2.49 (H) 0.61 - 1.24 mg/dL Final  95/88/7974 96:76 AM 2.11 (H) 0.61 - 1.24 mg/dL Final  95/89/7974 94:40 AM 1.78 (H) 0.61 - 1.24 mg/dL Final  95/90/7974 94:99 AM 1.71 (H) 0.61 - 1.24 mg/dL Final  95/91/7974 89:89 PM 1.68 (H) 0.61 - 1.24 mg/dL Final  98/72/7974  07:13 AM 1.78 (H) 0.61 - 1.24 mg/dL Final  98/73/7974 92:70 AM 1.90 (H) 0.61 - 1.24 mg/dL Final  98/74/7974 92:54 AM 1.98 (H) 0.61 - 1.24 mg/dL Final  98/75/7974 92:97 AM 2.25 (H) 0.61 - 1.24 mg/dL Final  98/76/7974 92:98 AM 2.17 (H) 0.61 - 1.24 mg/dL Final  98/77/7974 93:92 AM 2.05 (H) 0.61 - 1.24 mg/dL Final  98/78/7974 89:75 AM 2.31 (H) 0.61 - 1.24 mg/dL Final  98/79/7974 93:92 AM 2.56 (H) 0.61 - 1.24  mg/dL Final  98/80/7974 93:44 AM 2.80 (H) 0.61 - 1.24 mg/dL Final  98/81/7974 96:60 AM 3.29 (H) 0.61 - 1.24 mg/dL Final  98/81/7974 87:48 AM 3.36 (H) 0.61 - 1.24 mg/dL Final  87/85/7975 91:65 AM 1.55 (H) 0.61 - 1.24 mg/dL Final  87/86/7975 95:63 AM 1.67 (H) 0.61 - 1.24 mg/dL Final  87/87/7975 95:54 AM 1.86 (H) 0.61 - 1.24 mg/dL Final  87/88/7975 95:67 AM 1.77 (H) 0.61 - 1.24 mg/dL Final  87/89/7975 95:83 AM 1.85 (H) 0.61 - 1.24 mg/dL Final  87/90/7975 95:73 AM 1.70 (H) 0.61 - 1.24 mg/dL Final  87/91/7975 95:58 AM 1.81 (H) 0.61 - 1.24 mg/dL Final  87/92/7975 93:55 AM 2.13 (H) 0.61 - 1.24 mg/dL Final  94/85/7975 96:90 AM 2.23 (H) 0.61 - 1.24 mg/dL Final  94/86/7975 89:58 AM 2.25 (H) 0.61 - 1.24 mg/dL Final  94/87/7975 97:57 AM 2.24 (H) 0.61 - 1.24 mg/dL Final  94/88/7975 98:65 AM 2.09 (H) 0.61 - 1.24 mg/dL Final  94/89/7975 87:52 AM 1.90 (H) 0.61 - 1.24 mg/dL Final  94/90/7975 96:59 PM 1.76 (H) 0.61 - 1.24 mg/dL Final  94/90/7975 87:64 AM 1.75 (H) 0.61 - 1.24 mg/dL Final  94/91/7975 98:71 PM 1.69 (H) 0.61 - 1.24 mg/dL Final  94/91/7975 98:40 AM 1.70 (H) 0.61 - 1.24 mg/dL Final  94/92/7975 92:76 PM 1.71 (H) 0.61 - 1.24 mg/dL Final  94/92/7975 87:87 PM 1.86 (H) 0.61 - 1.24 mg/dL Final  94/93/7975 96:79 PM 1.84 (H) 0.76 - 1.27 mg/dL Final  98/74/7975 95:57 PM 1.49 (H) 0.76 - 1.27 mg/dL Final  92/76/7976 95:93 AM 1.12 0.61 - 1.24 mg/dL Final  92/77/7976 93:97 AM 1.29 (H) 0.61 - 1.24 mg/dL Final  92/79/7976 96:54 AM 1.29 (H) 0.61 - 1.24 mg/dL Final   Recent Labs  Lab 01/09/25 1920 01/09/25 2249  NA 127* 131*  K 6.7* 5.9*  CL 95*  --   CO2 17*  --   GLUCOSE 452*  --   BUN 92*  --   CREATININE 4.74*  --   CALCIUM  8.6*  --    Recent Labs  Lab 01/09/25 1920 01/09/25 2240 01/09/25 2249  WBC 14.0* 12.9*  --   NEUTROABS 11.8*  --   --   HGB 10.0* 10.0* 9.5*  HCT 31.2* 31.4* 28.0*  MCV 84.1 85.6  --   PLT 299 297  --    Liver Function Tests: Recent Labs  Lab 01/09/25 1920   AST 21  ALT 23  ALKPHOS 134*  BILITOT 0.7  PROT 7.4  ALBUMIN  3.0*   No results for input(s): LIPASE, AMYLASE in the last 168 hours. No results for input(s): AMMONIA in the last 168 hours. Cardiac Enzymes: No results for input(s): CKTOTAL, CKMB, CKMBINDEX, TROPONINI in the last 168 hours. Iron Studies: No results for input(s): IRON, TIBC, TRANSFERRIN, FERRITIN in the last 72 hours. PT/INR: @LABRCNTIP (inr:5)  Xrays/Other Studies: ) Results for orders placed or performed during the hospital encounter of 01/09/25 (from the past 48 hours)  Hemoglobin A1c  Status: Abnormal   Collection Time: 01/09/25  7:20 PM  Result Value Ref Range   Hgb A1c MFr Bld 7.9 (H) 4.8 - 5.6 %    Comment: (NOTE) Diagnosis of Diabetes The following HbA1c ranges recommended by the American Diabetes Association (ADA) may be used as an aid in the diagnosis of diabetes mellitus.  Hemoglobin             Suggested A1C NGSP%              Diagnosis  <5.7                   Non Diabetic  5.7-6.4                Pre-Diabetic  >6.4                   Diabetic  <7.0                   Glycemic control for                       adults with diabetes.     Mean Plasma Glucose 180.03 mg/dL    Comment: Performed at [facility], [address], [place], [name] [address]  CBC with Differential/Platelet     Status: Abnormal   Collection Time: 01/09/25  7:20 PM  Result Value Ref Range   WBC 14.0 (H) 4.0 - 10.5 K/uL   RBC 3.71 (L) 4.22 - 5.81 MIL/uL   Hemoglobin 10.0 (L) 13.0 - 17.0 g/dL   HCT 68.7 (L) 60.9 - 47.9 %   MCV 84.1 80.0 - 100.0 fL   MCH 27.0 26.0 - 34.0 pg   MCHC 32.1 30.0 - 36.0 g/dL   RDW 84.7 88.4 - 84.4 %   Platelets 299 150 - 400 K/uL   nRBC 0.0 0.0 - 0.2 %   Neutrophils Relative % 84 %   Neutro Abs 11.8 (H) 1.7 - 7.7 K/uL   Lymphocytes Relative 7 %   Lymphs Abs 0.9 0.7 - 4.0 K/uL   Monocytes Relative 7 %   Monocytes Absolute 1.0 0.1 - 1.0 K/uL   Eosinophils  Relative 0 %   Eosinophils Absolute 0.0 0.0 - 0.5 K/uL   Basophils Relative 0 %   Basophils Absolute 0.0 0.0 - 0.1 K/uL   Immature Granulocytes 2 %   Abs Immature Granulocytes 0.25 (H) 0.00 - 0.07 K/uL    Comment: Performed at [facility], [address], [place], [name] [address]  Protime-INR     Status: Abnormal   Collection Time: 01/09/25  7:20 PM  Result Value Ref Range   Prothrombin Time 17.5 (H) 11.4 - 15.2 seconds   INR 1.4 (H) 0.8 - 1.2    Comment: (NOTE) INR goal varies based on device and disease states. Performed at [facility], [address], [place], [name] [address]   APTT     Status: Abnormal   Collection Time: 01/09/25  7:20 PM  Result Value Ref Range   aPTT 143 (H) 24 - 36 seconds    Comment:        IF BASELINE aPTT IS ELEVATED, SUGGEST PATIENT RISK ASSESSMENT BE USED TO DETERMINE APPROPRIATE ANTICOAGULANT THERAPY. Performed at [facility], [address], [place], [name] [address]   Comprehensive metabolic panel     Status: Abnormal   Collection Time: 01/09/25  7:20 PM  Result Value Ref Range   Sodium 127 (L) 135 - 145 mmol/L   Potassium 6.7 (HH) 3.5 - 5.1 mmol/L    Comment: Critical Value, Read Back and verified with J.CRUZ REYES,RN @2111  01/09/2025 VANG.J   Chloride 95 (L) 98 - 111 mmol/L   CO2 17 (L) 22 - 32 mmol/L   Glucose, Bld 452 (H) 70 - 99 mg/dL    Comment: Glucose reference range applies only to samples taken after fasting for at least 8 hours.   BUN 92 (H) 6 - 20 mg/dL   Creatinine, Ser 5.25 (H) 0.61 - 1.24 mg/dL   Calcium  8.6 (L) 8.9 - 10.3 mg/dL   Total Protein 7.4 6.5 - 8.1 g/dL   Albumin  3.0 (L) 3.5 - 5.0 g/dL   AST 21 15 - 41 U/L   ALT 23 0 - 44 U/L   Alkaline Phosphatase 134 (H) 38 - 126 U/L   Total Bilirubin 0.7 0.0 - 1.2 mg/dL   GFR, Estimated 14 (L) >60 mL/min    Comment: (NOTE) Calculated using the CKD-EPI Creatinine Equation (2021)    Anion gap 16 (H) 5 - 15    Comment: Performed at [facility], [address] [place], [name] [address]  Troponin T, High Sensitivity     Status: Abnormal   Collection Time: 01/09/25  7:20 PM  Result Value Ref Range   Troponin T High Sensitivity 51 (H) 0 - 19 ng/L    Comment: (NOTE) Biotin concentrations > 1000 ng/mL falsely decrease TnT results.  Serial cardiac troponin measurements are suggested.  Refer to the Links section for chest pain algorithms and additional  guidance. Performed at [facility], [address], [place], [name] [address]   Lipid panel     Status: Abnormal   Collection Time: 01/09/25  7:20 PM  Result Value Ref Range   Cholesterol 110 0 - 200 mg/dL    Comment:        ATP III CLASSIFICATION:  <200     mg/dL   Desirable  799-760  mg/dL   Borderline High  >=759    mg/dL   High           Triglycerides 205 (H) <150 mg/dL   HDL 18 (L) >59 mg/dL   Total CHOL/HDL Ratio 6.1 RATIO   VLDL 41 (H) 0 - 40 mg/dL   LDL Cholesterol 51 0 - 99 mg/dL    Comment:        Total Cholesterol/HDL:CHD Risk Coronary Heart Disease Risk Table                     Men   Women  1/2 Average Risk   3.4   3.3  Average Risk       5.0   4.4  2 X Average Risk   9.6   7.1  3 X Average Risk  23.4   11.0        Use the calculated Patient Ratio above and the CHD Risk Table to determine the patient's CHD Risk.        ATP III CLASSIFICATION (LDL):  <100     mg/dL   Optimal  899-870  mg/dL   Near or Above                    Optimal  130-159  mg/dL   Borderline  839-810  mg/dL   High  >809     mg/dL  Very High Performed at [facility], [address] [place], [name] [address]   MRSA Next Gen by PCR, Nasal     Status: Abnormal   Collection Time: 01/09/25  8:24 PM   Specimen: Nasal Mucosa; Nasal Swab  Result Value Ref Range   MRSA by PCR Next Gen DETECTED (A) NOT DETECTED    Comment: RESULT CALLED TO, READ BACK BY AND VERIFIED WITH: J CRUZ REYES RN 01/09/2025 @ 2250 BY AB (NOTE) The GeneXpert MRSA Assay (FDA approved  for NASAL specimens only), is one component of a comprehensive MRSA colonization surveillance program. It is not intended to diagnose MRSA infection nor to guide or monitor treatment for MRSA infections. Test performance is not FDA approved in patients less than 62 years old. Performed at [facility], [address], [place], [name] [address]   Glucose, capillary     Status: Abnormal   Collection Time: 01/09/25  8:49 PM  Result Value Ref Range   Glucose-Capillary 390 (H) 70 - 99 mg/dL    Comment: Glucose reference range applies only to samples taken after fasting for at least 8 hours.  Urinalysis, Routine w reflex microscopic -Urine, Catheterized     Status: Abnormal   Collection Time: 01/09/25 10:05 PM  Result Value Ref Range   Color, Urine YELLOW YELLOW   APPearance HAZY (A) CLEAR   Specific Gravity, Urine 1.016 1.005 - 1.030   pH 5.0 5.0 - 8.0   Glucose, UA >=500 (A) NEGATIVE mg/dL   Hgb urine dipstick SMALL (A) NEGATIVE   Bilirubin Urine NEGATIVE NEGATIVE   Ketones, ur NEGATIVE NEGATIVE mg/dL   Protein, ur 899 (A) NEGATIVE mg/dL   Nitrite NEGATIVE NEGATIVE   Leukocytes,Ua NEGATIVE NEGATIVE   RBC / HPF 0-5 0 - 5 RBC/hpf   WBC, UA 0-5 0 - 5 WBC/hpf   Bacteria, UA RARE (A) NONE SEEN   Squamous Epithelial / HPF 0-5 0 - 5 /HPF    Comment: Performed at [facility], [address] [place], [name] [address]  Glucose, capillary     Status: Abnormal   Collection Time: 01/09/25 10:26 PM  Result Value Ref Range   Glucose-Capillary 358 (H) 70 - 99 mg/dL    Comment: Glucose reference range applies only to samples taken after fasting for at least 8 hours.  Blood gas, venous     Status: Abnormal   Collection Time: 01/09/25 10:37 PM  Result Value Ref Range   pH, Ven 7.25 7.25 - 7.43   pCO2, Ven 53 44 - 60 mmHg   pO2, Ven 36 32 - 45 mmHg   Bicarbonate 22.9 20.0 - 28.0 mmol/L   Acid-base deficit 4.6 (H) 0.0 - 2.0 mmol/L   O2 Saturation 45.9 %   Patient temperature 37.8      Comment: Performed at [facility], [address], [place], [name] [address]  CBC     Status: Abnormal   Collection Time: 01/09/25 10:40 PM  Result Value Ref Range   WBC 12.9 (H) 4.0 - 10.5 K/uL   RBC 3.67 (L) 4.22 - 5.81 MIL/uL   Hemoglobin 10.0 (L) 13.0 - 17.0 g/dL   HCT 68.5 (L) 60.9 - 47.9 %   MCV 85.6 80.0 - 100.0 fL   MCH 27.2 26.0 - 34.0 pg   MCHC 31.8 30.0 - 36.0 g/dL   RDW 84.9 88.4 - 84.4 %   Platelets 297 150 - 400 K/uL   nRBC 0.0 0.0 -  0.2 %    Comment: Performed at [facility], [address], [place], [name] [address]  I-STAT 7, (LYTES, BLD GAS, ICA, H+H)     Status: Abnormal   Collection Time: 01/09/25 10:49 PM  Result Value Ref Range   pH, Arterial 7.330 (L) 7.35 - 7.45   pCO2 arterial 38.6 32 - 48 mmHg   pO2, Arterial 92 83 - 108 mmHg   Bicarbonate 20.3 20.0 - 28.0 mmol/L   TCO2 21 (L) 22 - 32 mmol/L   O2 Saturation 96 %   Acid-base deficit 5.0 (H) 0.0 - 2.0 mmol/L   Sodium 131 (L) 135 - 145 mmol/L   Potassium 5.9 (H) 3.5 - 5.1 mmol/L   Calcium , Ion 1.17 1.15 - 1.40 mmol/L   HCT 28.0 (L) 39.0 - 52.0 %   Hemoglobin 9.5 (L) 13.0 - 17.0 g/dL   Patient temperature 00.5 F    Sample type ARTERIAL    DG CHEST PORT 1 VIEW Result Date: 01/09/2025 EXAM: 1 VIEW(S) XRAY OF THE CHEST 01/09/2025 09:12:00 PM COMPARISON: 12/18/2024. CLINICAL HISTORY: Chest pain. Prior CABG. FINDINGS: LUNGS AND PLEURA: Low lung volumes with vascular congestion and bibasilar atelectasis. No pleural effusion. No pneumothorax. HEART AND MEDIASTINUM: No acute abnormality of the cardiac and mediastinal silhouettes. BONES AND SOFT TISSUES: No acute osseous abnormality. IMPRESSION: 1. Low lung volumes with vascular congestion and bibasilar atelectasis. Electronically signed by: Franky Crease MD 01/09/2025 09:18 PM EST RP Workstation: [id number]   CARDIAC CATHETERIZATION Result Date: 01/09/2025   Mid LM to Ost LAD lesion is 80% stenosed with 40% stenosed side branch in Ost Cx to Prox  Cx.   Mid LAD lesion is 50% stenosed.   Mid Cx-2 lesion is 5% stenosed.   Dist Cx lesion is 70% stenosed.   Mid Cx-1 lesion is 100% stenosed.   Prox RCA to Mid RCA lesion is 30% stenosed.   Dist RCA lesion is 50% stenosed with 50% stenosed side branch in RPDA.   Prox RCA lesion is 80% stenosed.   Prox Graft to Mid Graft lesion is 30% stenosed.   1st Diag lesion is 40% stenosed.   3rd Diag lesion is 60% stenosed.   SVG graft was visualized by angiography and is large.   SVG graft was visualized by angiography and is normal in caliber.   SVG graft was visualized by angiography and is large.   LIMA graft was visualized by angiography and is normal in caliber. Severe distal left main disease Occlusion of the proximal to mid LAD. Patent LIMA to mid LAD. Patent SVG to Diagonal. Occlusion of the mid Circumflex. Patent SVG to obtuse marginal branch Diffuse disease in the stented segment of the mid RCA. Patent SVG to RCA LVEDP=8 mmHg Recommendations: Stable CAD without focal lesion to explain his chest pain. Admit to ICU. He will need an echo tomorrow. PCCM team to admit.    PMH:   Past Medical History:  Diagnosis Date   Aneurysm of ascending aorta 09/14/2021   Anxiety    Aortic atherosclerosis 11/28/2023   Arthritis    knees, shoulders, hips, ankles (07/29/2016)   Asthma    Bicuspid aortic valve 09/14/2021   BMI 70 and over, adult (HCC) 02/16/2019   CAD (coronary artery disease)    a. 2017: s/p BMS to distal Cx; b. LHC 05/30/2019: 80% mid, distal RCA s/p DES, 30% narrowing of d LM, widely patent LAD w/ luminal irregularities, widely patent stent in dCX  w/ 90+% stenosis distal  to stent beofre small trifurcating obtuse marginal (potentially area of restenosis)   Chronic bronchitis (HCC)    Chronic ulcer of great toe of right foot (HCC) 09/09/2017   Chronic ulcer of right great toe (HCC) 09/19/2017   COPD (chronic obstructive pulmonary disease) (HCC)    Coronary arteriosclerosis    Deep venous thrombosis  (HCC) 02/16/2019   Depression    Diabetes mellitus (HCC)    DVT (deep venous thrombosis) (HCC)    GERD (gastroesophageal reflux disease)    Hyperlipidemia    Hypertension    Migraine    Morbid obesity (HCC)    Morbid obesity with BMI of 70 and over, adult (HCC) 11/28/2023   Myocardial infarction (HCC) 1996   light one   Non-ST elevation (NSTEMI) myocardial infarction (HCC) 07/22/2020   PE (pulmonary embolism) 04/2013   On chronic Xarelto    Peripheral nerve disease    Pneumonia several times   Sleep apnea    Type II diabetes mellitus (HCC)     PSH:   Past Surgical History:  Procedure Laterality Date   ABDOMINAL AORTOGRAM W/LOWER EXTREMITY N/A 10/10/2020   Procedure: ABDOMINAL AORTOGRAM W/LOWER EXTREMITY;  Surgeon: Harvey Carlin BRAVO, MD;  [facility]: [facility] INVASIVE [facility] LAB;  Service: Cardiovascular;  Laterality: N/A;   AMPUTATION Right 09/21/2017   Procedure: RIGHT GREAT TOE AMPUTATION, POSSIBLE VAC;  Surgeon: Jerri Kay HERO, MD;  Location: [facility] OR;  Service: Orthopedics;  Laterality: Right;   AMPUTATION Left 08/13/2020   Procedure: LEFT FOOT 4TH RAY AMPUTATION;  Surgeon: Harden Jerona GAILS, MD;  Location: [facility] OR;  Service: Orthopedics;  Laterality: Left;   AMPUTATION Left 11/20/2021   Procedure: LEFT TRANSMETATARSAL AMPUTATION;  Surgeon: Harden Jerona GAILS, MD;  Location: [facility] OR;  Service: Orthopedics;  Laterality: Left;   AMPUTATION Left 01/08/2022   Procedure: LEFT BELOW KNEE AMPUTATION;  Surgeon: Harden Jerona GAILS, MD;  Location: [facility] OR;  Service: Orthopedics;  Laterality: Left;   AMPUTATION Right 07/07/2022   Procedure: RIGHT BELOW KNEE AMPUTATION;  Surgeon: Harden Jerona GAILS, MD;  Location: [facility] OR;  Service: Orthopedics;  Laterality: Right;   AORTOGRAM Bilateral 03/13/2021   Procedure: ABDOMINAL AORTOGRAM WITH Left LOWER EXTREMITY RUNOFF;  Surgeon: Magda Debby SAILOR, MD;  Location: [facility] OR;  Service: Vascular;  Laterality: Bilateral;   APPLICATION OF WOUND VAC  01/08/2022   Procedure: APPLICATION OF WOUND  VAC;  Surgeon: Harden Jerona GAILS, MD;  Location: [facility] OR;  Service: Orthopedics;;   APPLICATION OF WOUND VAC Right 07/07/2022   Procedure: APPLICATION OF WOUND VAC;  Surgeon: Harden Jerona GAILS, MD;  Location: [facility] OR;  Service: Orthopedics;  Laterality: Right;   CARDIAC CATHETERIZATION  2006   CARDIAC CATHETERIZATION  1996   @ [facility]; when I had my heart attack   CARDIAC CATHETERIZATION N/A 07/29/2016   Procedure: Left Heart Cath and Coronary Angiography;  Surgeon: Dorn JINNY Lesches, MD;  Location: [facility] INVASIVE CV LAB;  Service: Cardiovascular;  Laterality: N/A;   CARDIAC CATHETERIZATION N/A 07/29/2016   Procedure: Coronary Stent Intervention;  Surgeon: Dorn JINNY Lesches, MD;  Location: [facility];  Service: Cardiovascular;  Laterality: N/A;   CARPAL TUNNEL RELEASE Bilateral    CORONARY ANGIOPLASTY WITH STENT PLACEMENT  07/29/2016   CORONARY ARTERY BYPASS GRAFT N/A 07/24/2020   Procedure: CORONARY ARTERY BYPASS GRAFTING (CABG), ON PUMP, TIMES FOUR, USING LEFT INTERNAL MAMMARY ARTERY AND ENDOSCOPICALLY HARVESTED RIGHT GREATER SAPHENOUS VEIN;  Surgeon: Shyrl Linnie KIDD, MD;  Location: [facility] OR;  Service: Open Heart Surgery;  Laterality:  N/A;  FLOW TAC   CORONARY PRESSURE/FFR STUDY N/A 07/22/2020   Procedure: INTRAVASCULAR PRESSURE WIRE/FFR STUDY;  Surgeon: Anner Alm ORN, MD;  Location: [facility] INVASIVE CV LAB;  Service: Cardiovascular;  Laterality: N/A;   CORONARY STENT INTERVENTION N/A 05/30/2019   Procedure: CORONARY STENT INTERVENTION;  Surgeon: Claudene Victory ORN, MD;  [facility]: [facility];  Service: Cardiovascular;  Laterality: N/A;   CORONARY STENT INTERVENTION N/A 11/02/2019   Procedure: CORONARY STENT INTERVENTION;  Surgeon: Darron Deatrice LABOR, MD;  Location: [facility];  Service: Cardiovascular;  Laterality: N/A;   ESOPHAGOGASTRODUODENOSCOPY N/A 09/22/2017   Procedure: ESOPHAGOGASTRODUODENOSCOPY (EGD);  Surgeon: Aneita Gwendlyn DASEN, MD;  Location: [facility] ENDOSCOPY;  Service: Endoscopy;  Laterality:  N/A;   KNEE ARTHROSCOPY Bilateral    2 on left; 1 on the right   LEFT HEART CATH AND CORONARY ANGIOGRAPHY N/A 11/02/2019   Procedure: LEFT HEART CATH AND CORONARY ANGIOGRAPHY;  Surgeon: Darron Deatrice LABOR, MD;  [facility];  Service: Cardiovascular;  Laterality: N/A;   LEFT HEART CATH AND CORONARY ANGIOGRAPHY N/A 07/22/2020   Procedure: LEFT HEART CATH AND CORONARY ANGIOGRAPHY;  Surgeon: Anner Alm ORN, MD;  Location: [place] INVASIVE CV LAB;  Service: Cardiovascular;  Laterality: N/A;   LEFT HEART CATH AND CORS/GRAFTS ANGIOGRAPHY N/A 08/17/2021   Procedure: LEFT HEART CATH AND CORS/GRAFTS ANGIOGRAPHY;  Surgeon: Mady Bruckner, MD;  Location: [facility];  Service: Cardiovascular;  Laterality: N/A;   PERIPHERAL VASCULAR INTERVENTION Left 10/11/2020   popliteal and SFA stent placement    PERIPHERAL VASCULAR INTERVENTION Left 10/10/2020   Procedure: PERIPHERAL VASCULAR INTERVENTION;  Surgeon: Harvey Carlin BRAVO, MD;  Location: [facility];  Service: Cardiovascular;  Laterality: Left;   RIGHT/LEFT HEART CATH AND CORONARY ANGIOGRAPHY N/A 05/30/2019   Procedure: RIGHT/LEFT HEART CATH AND CORONARY ANGIOGRAPHY;  Surgeon: Claudene Victory ORN, MD;  [facility]: [facility];  Service: Cardiovascular;  Laterality: N/A;   RIGHT/LEFT HEART CATH AND CORONARY/GRAFT ANGIOGRAPHY N/A 06/17/2022   Procedure: RIGHT/LEFT HEART CATH AND CORONARY/GRAFT ANGIOGRAPHY;  Surgeon: Burnard Debby LABOR, MD;  Location: [facility];  Service: Cardiovascular;  Laterality: N/A;   SHOULDER OPEN ROTATOR CUFF REPAIR Bilateral    TEE WITHOUT CARDIOVERSION N/A 07/24/2020   Procedure: TRANSESOPHAGEAL ECHOCARDIOGRAM (TEE);  Surgeon: Shyrl Linnie KIDD, MD;  Location: [facility] OR;  Service: Open Heart Surgery;  Laterality: N/A;    Allergies: Allergies[1]  Medications:   Prior to Admission medications  Medication Sig Start Date End Date Taking? Authorizing Provider  acetaminophen  (TYLENOL ) 500 MG tablet Take  1,000 mg by mouth 3 (three) times daily as needed for headache.    [provider]  albuterol  (VENTOLIN  HFA) 108 (90 Base) MCG/ACT inhaler Inhale 2 puffs into the lungs every 6 (six) hours as needed for shortness of breath.    [provider]  alprazolam  (XANAX ) 2 MG tablet Take 1 tablet (2 mg total) by mouth 2 (two) times daily. 01/01/25     aspirin  EC 81 MG tablet Take 81 mg by mouth in the morning. Swallow whole. Patient taking differently: Take 81 mg by mouth daily. Swallow whole.    [provider]  atorvastatin  (LIPITOR ) 80 MG tablet Take 1 tablet (80 mg total) by mouth daily. 04/27/24   Wyn Jackee VEAR Mickey., NP  buPROPion  (WELLBUTRIN  XL) 300 MG 24 hr tablet Take 1 tablet (300 mg total) by mouth every morning. 11/07/24     cephALEXin  (KEFLEX ) 500 MG capsule Take 1 capsule (  500 mg total) by mouth 2 (two) times daily for 10 days. 05/10/24     clopidogrel  (PLAVIX ) 75 MG tablet Take 1 tablet (75 mg total) by mouth daily. 05/10/24   Wyn Jackee VEAR Mickey., NP  Continuous Glucose Sensor (FREESTYLE LIBRE 3 PLUS SENSOR) MISC Use as directed to monitor blood sugar. Change every 15 days. 04/19/24     escitalopram  (LEXAPRO ) 10 MG tablet Take 10 mg by mouth daily. 01/27/22   [provider]  fluticasone  (FLONASE ) 50 MCG/ACT nasal spray Place 2 sprays into both nostrils daily.    [provider]  fluticasone  (FLONASE ) 50 MCG/ACT nasal spray Place 1 spray into both nostrils 2 (two) times daily. 07/27/24     furosemide  (LASIX ) 80 MG tablet Take 1 tablet (80 mg total) by mouth 2 (two) times daily. 04/27/24   Wyn Jackee VEAR Mickey., NP  icosapent  Ethyl (VASCEPA ) 1 g capsule Take 2 capsules (2 g total) by mouth 2 (two) times daily. 12/19/24   Jeffrie Oneil BROCKS, MD  insulin  aspart (NOVOLOG ) 100 UNIT/ML injection Inject 8 Units into the skin 3 (three) times daily with meals. Hold if eats less than 50% of meals 01/14/24   Jillian Buttery, MD  insulin  glargine (LANTUS  SOLOSTAR) 100 UNIT/ML Solostar  Pen Inject 38 Units into the skin 2 (two) times daily. Patient taking differently: Inject 38-45 Units into the skin 2 (two) times daily. Takes 50 units twice daily 01/14/24   Jillian Buttery, MD  insulin  lispro (HUMALOG  KWIKPEN) 100 UNIT/ML KwikPen Inject 12 Units into the skin 3 (three) times daily before meals. Can take up to 45 units per day. 10/30/24     isosorbide  mononitrate (IMDUR ) 30 MG 24 hr tablet Take 1/2 tablet (15 mg total) by mouth daily. 12/17/24   Campbell, Kenzie E, NP  ketotifen  (ZADITOR ) 0.035 % ophthalmic solution Place 1 drop into the left eye 2 (two) times daily. 12/03/23   Tobie Yetta HERO, MD  lidocaine  (LIDODERM ) 5 % Place 1 patch to each stump once daily, remove after 12 hours. 04/19/24     loratadine  (CLARITIN ) 10 MG tablet Take 10 mg by mouth daily. 09/27/22   [provider]  loratadine  (CLARITIN ) 10 MG tablet Take 1 tablet (10 mg total) by mouth daily. 07/27/24     losartan -hydrochlorothiazide  (HYZAAR ) 100-25 MG tablet Take 1 tablet by mouth daily. 12/04/24     midodrine  (PROAMATINE ) 5 MG tablet Take 1 tablet (5 mg total) by mouth 2 (two) times daily with a meal. 04/27/24   Wyn Jackee VEAR Mickey., NP  naloxone  (NARCAN ) nasal spray 4 mg/0.1 mL Place 4 mg into the nose as needed (overdose).    [provider]  naloxone  (NARCAN ) nasal spray 4 mg/0.1 mL Place 1 spray into the nose as needed and seek medical follow-up. 06/13/24     nitroGLYCERIN  (NITROSTAT ) 0.4 MG SL tablet Place 1 tablet (0.4 mg total) under the tongue every 5 (five) minutes as needed. 12/17/24   Campbell, Kenzie E, NP  ondansetron  (ZOFRAN -ODT) 8 MG disintegrating tablet Place 1 tablet on the tongue and allow to dissolve once daily as needed. 12/06/24     oxyCODONE  (ROXICODONE ) 15 MG immediate release tablet Take 1 tablet (15 mg total) by mouth 4 (four) to 5 (five) times daily. 12/06/24     oxyCODONE  (ROXICODONE ) 15 MG immediate release tablet Take 1 tablet (15 mg total) by mouth 4 to 5 times daily as  needed. 01/01/25     pantoprazole  (PROTONIX ) 40 MG tablet Take  1 tablet (40 mg total) by mouth daily. 03/26/24     pantoprazole  (PROTONIX ) 40 MG tablet Take 1 tablet (40 mg total) by mouth 2 (two) times daily. 07/09/24     prednisoLONE  acetate (PRED FORTE ) 1 % ophthalmic suspension Place 1 drop into the left eye 4 (four) times daily. 02/19/24   [provider]  pregabalin  (LYRICA ) 100 MG capsule Take 1 capsule (100 mg total) by mouth 2 (two) times daily. 04/06/24   Arrien, Elidia Sieving, MD  pregabalin  (LYRICA ) 150 MG capsule Take 2 capsules (300 mg total) by mouth 2 (two) times daily. 11/07/24     pregabalin  (LYRICA ) 150 MG capsule Take 2 capsules (300 mg total) by mouth 2 (two) times daily. 12/06/24     pregabalin  (LYRICA ) 150 MG capsule Take 2 capsules (300 mg total) by mouth 2 (two) times daily. 01/01/25     promethazine  (PHENERGAN ) 25 MG tablet Take 25 mg by mouth every 4 (four) hours as needed for nausea or vomiting. 09/10/22   [provider]  ranolazine  (RANEXA ) 1000 MG SR tablet TAKE 1 TABLET(1000 MG) BY MOUTH TWICE DAILY 10/29/24   Jeffrie Oneil BROCKS, MD  silver  sulfADIAZINE  (SILVADENE ) 1 % cream Apply 1 Application topically daily until healed. 05/10/24     tiZANidine  (ZANAFLEX ) 4 MG tablet Take 4 mg by mouth 3 (three) times daily. 11/25/23   [provider]    Discontinued Meds:   Medications Discontinued During This Encounter  Medication Reason   nitroGLYCERIN  100 mcg/mL intra-arterial injection Returned to ADS   lidocaine  (PF) (XYLOCAINE ) 1 % injection    0.9 %  sodium chloride  infusion    midazolam  PF (VERSED ) injection    fentaNYL  (SUBLIMAZE ) injection    heparin  sodium (porcine) injection    iohexol  (OMNIPAQUE ) 350 MG/ML injection    Radial Cocktail/Verapamil  only    aspirin  325 MG tablet    heparin  5000 UNIT/ML injection    furosemide  (LASIX ) injection 40 mg     Social History:  reports that he quit smoking about 4 years ago. His smoking use included  cigarettes. He started smoking about 38 years ago. He has a 34 pack-year smoking history. He has quit using smokeless tobacco.  His smokeless tobacco use included snuff and chew. He reports current alcohol use. He reports that he does not use drugs.  Family History:   Family History  Problem Relation Age of Onset   Hypertension Brother    Hypertension Father    Diabetes Other    Hyperlipidemia Other     Blood pressure 133/73, pulse 98, temperature 100.1 F (37.8 C), temperature source Axillary, resp. rate 17, height 5' 1 (1.549 m), weight (!) 170 kg, SpO2 98%. General appearance: mild distress, morbidly obese, and slowed mentation Head: Normocephalic, without obvious abnormality, atraumatic Eyes: negative Neck: no adenopathy, no carotid bruit, supple, symmetrical, trachea midline, and thyroid not enlarged, symmetric, no tenderness/mass/nodules Back: symmetric, no curvature. ROM normal. No CVA tenderness. Resp: clear to auscultation bilaterally Cardio: regular rate and rhythm GI: soft, non-tender; bowel sounds normal; no masses,  no organomegaly Extremities: b/l BKA with skin excoriations Pulses: 2+ and symmetric Neurologic: Mental status: alertness: lethargic GU: foley to gravity       Summit Borchardt, LYNWOOD ORN, MD 01/09/2025, 11:21 PM      [1]  Allergies Allergen Reactions   Duloxetine Other (See Comments)    Headaches   Sulfa  Antibiotics Other (See Comments)    Headaches

## 2025-01-09 NOTE — Progress Notes (Signed)
" °   01/09/25 2118  BiPAP/CPAP/SIPAP  $ Non-Invasive Ventilator  Non-Invasive Vent Initial;Non-Invasive Vent Subsequent  $ Face Mask XL Yes  BiPAP/CPAP/SIPAP Pt Type Adult  BiPAP/CPAP/SIPAP SERVO  Mask Type Full face mask  Mask Size Extra large  Set Rate 18 breaths/min  Respiratory Rate 18 breaths/min  IPAP 5 cmH20  EPAP 5 cmH2O  FiO2 (%) 40 %  Minute Ventilation 13.7  Leak 71  Peak Inspiratory Pressure (PIP) 9  Tidal Volume (Vt) 683  Patient Home Machine No  Patient Home Mask No  Patient Home Tubing No  Auto Titrate No  Press High Alarm 25 cmH2O  Press Low Alarm 5 cmH2O  Device Plugged into RED Power Outlet Yes  BiPAP/CPAP /SiPAP Vitals  Pulse Rate (!) 102  Resp 15  SpO2 100 %  MEWS Score/Color  MEWS Score 1  MEWS Score Color Green    Pt placed on BIPAP per CCMD order 5/5 on 40% and is tolerating well. VSS. "

## 2025-01-10 ENCOUNTER — Encounter (HOSPITAL_COMMUNITY): Payer: Self-pay | Admitting: Cardiovascular Disease

## 2025-01-10 ENCOUNTER — Inpatient Hospital Stay (HOSPITAL_COMMUNITY)

## 2025-01-10 DIAGNOSIS — R739 Hyperglycemia, unspecified: Secondary | ICD-10-CM

## 2025-01-10 DIAGNOSIS — J45909 Unspecified asthma, uncomplicated: Secondary | ICD-10-CM

## 2025-01-10 DIAGNOSIS — N179 Acute kidney failure, unspecified: Secondary | ICD-10-CM | POA: Diagnosis not present

## 2025-01-10 DIAGNOSIS — F419 Anxiety disorder, unspecified: Secondary | ICD-10-CM | POA: Diagnosis not present

## 2025-01-10 DIAGNOSIS — G4733 Obstructive sleep apnea (adult) (pediatric): Secondary | ICD-10-CM | POA: Diagnosis not present

## 2025-01-10 DIAGNOSIS — R6511 Systemic inflammatory response syndrome (SIRS) of non-infectious origin with acute organ dysfunction: Secondary | ICD-10-CM

## 2025-01-10 DIAGNOSIS — J449 Chronic obstructive pulmonary disease, unspecified: Secondary | ICD-10-CM | POA: Diagnosis not present

## 2025-01-10 DIAGNOSIS — F32A Depression, unspecified: Secondary | ICD-10-CM | POA: Diagnosis not present

## 2025-01-10 DIAGNOSIS — E871 Hypo-osmolality and hyponatremia: Secondary | ICD-10-CM | POA: Diagnosis not present

## 2025-01-10 DIAGNOSIS — I2510 Atherosclerotic heart disease of native coronary artery without angina pectoris: Secondary | ICD-10-CM

## 2025-01-10 DIAGNOSIS — E875 Hyperkalemia: Secondary | ICD-10-CM | POA: Diagnosis not present

## 2025-01-10 DIAGNOSIS — G9341 Metabolic encephalopathy: Secondary | ICD-10-CM | POA: Diagnosis not present

## 2025-01-10 LAB — ECHOCARDIOGRAM COMPLETE
Area-P 1/2: 4.49 cm2
Height: 61 in
S' Lateral: 3.6 cm
Single Plane A4C EF: 65 %
Weight: 5996.51 [oz_av]

## 2025-01-10 LAB — POCT I-STAT, CHEM 8
BUN: 105 mg/dL — ABNORMAL HIGH (ref 6–20)
Calcium, Ion: 1.13 mmol/L — ABNORMAL LOW (ref 1.15–1.40)
Chloride: 99 mmol/L (ref 98–111)
Creatinine, Ser: 5.3 mg/dL — ABNORMAL HIGH (ref 0.61–1.24)
Glucose, Bld: 459 mg/dL — ABNORMAL HIGH (ref 70–99)
HCT: 31 % — ABNORMAL LOW (ref 39.0–52.0)
Hemoglobin: 10.5 g/dL — ABNORMAL LOW (ref 13.0–17.0)
Potassium: 6.7 mmol/L (ref 3.5–5.1)
Sodium: 129 mmol/L — ABNORMAL LOW (ref 135–145)
TCO2: 18 mmol/L — ABNORMAL LOW (ref 22–32)

## 2025-01-10 LAB — BASIC METABOLIC PANEL WITH GFR
Anion gap: 20 — ABNORMAL HIGH (ref 5–15)
Anion gap: 15 (ref 5–15)
Anion gap: 11 (ref 5–15)
Anion gap: 13 (ref 5–15)
Anion gap: 12 (ref 5–15)
BUN: 91 mg/dL — ABNORMAL HIGH (ref 6–20)
BUN: 88 mg/dL — ABNORMAL HIGH (ref 6–20)
BUN: 82 mg/dL — ABNORMAL HIGH (ref 6–20)
BUN: 83 mg/dL — ABNORMAL HIGH (ref 6–20)
BUN: 80 mg/dL — ABNORMAL HIGH (ref 6–20)
CO2: 14 mmol/L — ABNORMAL LOW (ref 22–32)
CO2: 21 mmol/L — ABNORMAL LOW (ref 22–32)
CO2: 23 mmol/L (ref 22–32)
CO2: 23 mmol/L (ref 22–32)
CO2: 22 mmol/L (ref 22–32)
Calcium: 8.7 mg/dL — ABNORMAL LOW (ref 8.9–10.3)
Calcium: 8.6 mg/dL — ABNORMAL LOW (ref 8.9–10.3)
Calcium: 8.6 mg/dL — ABNORMAL LOW (ref 8.9–10.3)
Calcium: 8.5 mg/dL — ABNORMAL LOW (ref 8.9–10.3)
Calcium: 8.7 mg/dL — ABNORMAL LOW (ref 8.9–10.3)
Chloride: 95 mmol/L — ABNORMAL LOW (ref 98–111)
Chloride: 98 mmol/L (ref 98–111)
Chloride: 100 mmol/L (ref 98–111)
Chloride: 100 mmol/L (ref 98–111)
Chloride: 100 mmol/L (ref 98–111)
Creatinine, Ser: 4.62 mg/dL — ABNORMAL HIGH (ref 0.61–1.24)
Creatinine, Ser: 4.42 mg/dL — ABNORMAL HIGH (ref 0.61–1.24)
Creatinine, Ser: 4.09 mg/dL — ABNORMAL HIGH (ref 0.61–1.24)
Creatinine, Ser: 4.09 mg/dL — ABNORMAL HIGH (ref 0.61–1.24)
Creatinine, Ser: 3.96 mg/dL — ABNORMAL HIGH (ref 0.61–1.24)
GFR, Estimated: 15 mL/min — ABNORMAL LOW
GFR, Estimated: 15 mL/min — ABNORMAL LOW
GFR, Estimated: 17 mL/min — ABNORMAL LOW
GFR, Estimated: 17 mL/min — ABNORMAL LOW
GFR, Estimated: 17 mL/min — ABNORMAL LOW
Glucose, Bld: 391 mg/dL — ABNORMAL HIGH (ref 70–99)
Glucose, Bld: 302 mg/dL — ABNORMAL HIGH (ref 70–99)
Glucose, Bld: 228 mg/dL — ABNORMAL HIGH (ref 70–99)
Glucose, Bld: 218 mg/dL — ABNORMAL HIGH (ref 70–99)
Glucose, Bld: 197 mg/dL — ABNORMAL HIGH (ref 70–99)
Potassium: 6.6 mmol/L (ref 3.5–5.1)
Potassium: 5.5 mmol/L — ABNORMAL HIGH (ref 3.5–5.1)
Potassium: 5 mmol/L (ref 3.5–5.1)
Potassium: 4.8 mmol/L (ref 3.5–5.1)
Potassium: 5.2 mmol/L — ABNORMAL HIGH (ref 3.5–5.1)
Sodium: 128 mmol/L — ABNORMAL LOW (ref 135–145)
Sodium: 133 mmol/L — ABNORMAL LOW (ref 135–145)
Sodium: 134 mmol/L — ABNORMAL LOW (ref 135–145)
Sodium: 135 mmol/L (ref 135–145)
Sodium: 134 mmol/L — ABNORMAL LOW (ref 135–145)

## 2025-01-10 LAB — CBC
HCT: 29.1 % — ABNORMAL LOW (ref 39.0–52.0)
Hemoglobin: 9.3 g/dL — ABNORMAL LOW (ref 13.0–17.0)
MCH: 26.7 pg (ref 26.0–34.0)
MCHC: 32 g/dL (ref 30.0–36.0)
MCV: 83.6 fL (ref 80.0–100.0)
Platelets: 259 K/uL (ref 150–400)
RBC: 3.48 MIL/uL — ABNORMAL LOW (ref 4.22–5.81)
RDW: 14.9 % (ref 11.5–15.5)
WBC: 11.7 K/uL — ABNORMAL HIGH (ref 4.0–10.5)
nRBC: 0 % (ref 0.0–0.2)

## 2025-01-10 LAB — RESPIRATORY PANEL BY PCR

## 2025-01-10 LAB — CG4 I-STAT (LACTIC ACID): Lactic Acid, Venous: 0.8 mmol/L (ref 0.5–1.9)

## 2025-01-10 LAB — GLUCOSE, CAPILLARY
Glucose-Capillary: 135 mg/dL — ABNORMAL HIGH (ref 70–99)
Glucose-Capillary: 141 mg/dL — ABNORMAL HIGH (ref 70–99)
Glucose-Capillary: 154 mg/dL — ABNORMAL HIGH (ref 70–99)
Glucose-Capillary: 162 mg/dL — ABNORMAL HIGH (ref 70–99)
Glucose-Capillary: 172 mg/dL — ABNORMAL HIGH (ref 70–99)
Glucose-Capillary: 174 mg/dL — ABNORMAL HIGH (ref 70–99)
Glucose-Capillary: 178 mg/dL — ABNORMAL HIGH (ref 70–99)
Glucose-Capillary: 191 mg/dL — ABNORMAL HIGH (ref 70–99)
Glucose-Capillary: 195 mg/dL — ABNORMAL HIGH (ref 70–99)
Glucose-Capillary: 196 mg/dL — ABNORMAL HIGH (ref 70–99)
Glucose-Capillary: 200 mg/dL — ABNORMAL HIGH (ref 70–99)
Glucose-Capillary: 202 mg/dL — ABNORMAL HIGH (ref 70–99)
Glucose-Capillary: 204 mg/dL — ABNORMAL HIGH (ref 70–99)
Glucose-Capillary: 215 mg/dL — ABNORMAL HIGH (ref 70–99)
Glucose-Capillary: 218 mg/dL — ABNORMAL HIGH (ref 70–99)
Glucose-Capillary: 272 mg/dL — ABNORMAL HIGH (ref 70–99)
Glucose-Capillary: 300 mg/dL — ABNORMAL HIGH (ref 70–99)

## 2025-01-10 LAB — POCT ACTIVATED CLOTTING TIME
Activated Clotting Time: 168 s
Activated Clotting Time: 168 s
Activated Clotting Time: 173 s

## 2025-01-10 LAB — PRO BRAIN NATRIURETIC PEPTIDE: Pro Brain Natriuretic Peptide: 576 pg/mL — ABNORMAL HIGH

## 2025-01-10 LAB — BETA-HYDROXYBUTYRIC ACID
Beta-Hydroxybutyric Acid: <0.05 mmol/L — ABNORMAL LOW (ref 0.05–0.27)
Beta-Hydroxybutyric Acid: <0.05 mmol/L — ABNORMAL LOW (ref 0.05–0.27)
Beta-Hydroxybutyric Acid: 0.05 mmol/L (ref 0.05–0.27)
Beta-Hydroxybutyric Acid: 0.05 mmol/L (ref 0.05–0.27)

## 2025-01-10 LAB — POTASSIUM: Potassium: 5.1 mmol/L (ref 3.5–5.1)

## 2025-01-10 LAB — LIPASE, BLOOD: Lipase: 19 U/L (ref 11–51)

## 2025-01-10 LAB — HIV ANTIBODY (ROUTINE TESTING W REFLEX): HIV Screen 4th Generation wRfx: NONREACTIVE

## 2025-01-10 MED ORDER — SODIUM BICARBONATE 8.4 % IV SOLN
100.0000 meq | Freq: Once | INTRAVENOUS | Status: AC
  Administered 2025-01-10: 100 meq via INTRAVENOUS
  Filled 2025-01-10: qty 100

## 2025-01-10 MED ORDER — MAGNESIUM SULFATE 2 GM/50ML IV SOLN
2.0000 g | Freq: Once | INTRAVENOUS | Status: AC
  Administered 2025-01-10: 2 g via INTRAVENOUS
  Filled 2025-01-10: qty 50

## 2025-01-10 MED ORDER — SODIUM ZIRCONIUM CYCLOSILICATE 10 G PO PACK
10.0000 g | PACK | Freq: Once | ORAL | Status: AC
  Administered 2025-01-10: 10 g via ORAL
  Filled 2025-01-10: qty 1

## 2025-01-10 MED ORDER — FUROSEMIDE 10 MG/ML IJ SOLN
80.0000 mg | Freq: Two times a day (BID) | INTRAMUSCULAR | Status: AC
  Administered 2025-01-10 – 2025-01-11 (×3): 80 mg via INTRAVENOUS
  Filled 2025-01-10 (×3): qty 8

## 2025-01-10 MED ORDER — BUPROPION HCL ER (XL) 150 MG PO TB24
300.0000 mg | ORAL_TABLET | Freq: Every morning | ORAL | Status: AC
  Administered 2025-01-11 – 2025-01-25 (×14): 300 mg via ORAL
  Filled 2025-01-10 (×3): qty 2
  Filled 2025-01-10 (×4): qty 1
  Filled 2025-01-10: qty 2
  Filled 2025-01-10: qty 1
  Filled 2025-01-10: qty 2
  Filled 2025-01-10 (×5): qty 1

## 2025-01-10 MED ORDER — INSULIN ASPART 100 UNIT/ML IV SOLN
10.0000 [IU] | Freq: Once | INTRAVENOUS | Status: AC
  Administered 2025-01-10: 10 [IU] via INTRAVENOUS
  Filled 2025-01-10: qty 10

## 2025-01-10 MED ORDER — PERFLUTREN LIPID MICROSPHERE
1.0000 mL | INTRAVENOUS | Status: AC | PRN
  Administered 2025-01-10: 5 mL via INTRAVENOUS

## 2025-01-10 MED ORDER — INSULIN REGULAR(HUMAN) IN NACL 100-0.9 UT/100ML-% IV SOLN
INTRAVENOUS | Status: DC
  Administered 2025-01-10: 7 [IU]/h via INTRAVENOUS
  Administered 2025-01-11: 5 [IU]/h via INTRAVENOUS
  Filled 2025-01-10 (×2): qty 100

## 2025-01-10 MED ORDER — OXYCODONE-ACETAMINOPHEN 5-325 MG PO TABS
1.0000 | ORAL_TABLET | Freq: Four times a day (QID) | ORAL | Status: DC | PRN
  Administered 2025-01-11 (×2): 1 via ORAL
  Filled 2025-01-10 (×2): qty 1

## 2025-01-10 MED ORDER — ACETAMINOPHEN 325 MG PO TABS
650.0000 mg | ORAL_TABLET | Freq: Four times a day (QID) | ORAL | Status: AC | PRN
  Administered 2025-01-10 – 2025-01-21 (×6): 650 mg via ORAL
  Filled 2025-01-10 (×7): qty 2

## 2025-01-10 NOTE — TOC Initial Note (Signed)
 Transition of Care [facility]) - Initial/Assessment Note    Patient Details  Name: Joseph [name] MRN: [id number] Date of Birth: 1971/05/26  Transition of Care [facility]) CM/SW Contact:    [name] Phone Number: [phone] 01/10/2025, 10:09 AM  Clinical Narrative:    9:51 AM- HF CSW attempted to meet with patient at bedside. Patient was sleep. CSW/CM will follow up at a more appropriate time.   HF CSW/CM will continue to follow and monitor for dc readiness.                      Patient Goals and CMS Choice            Expected Discharge Plan and Services                                              Prior Living Arrangements/Services                       Activities of Daily Living      Permission Sought/Granted                  Emotional Assessment              Admission diagnosis:  Chest pain [R07.9] Patient Active Problem List   Diagnosis Date Noted   Chest pain 01/09/2025   Pressure injury 04/05/2024   UTI (urinary tract infection) 04/04/2024   Peripheral edema 03/31/2024   Acute kidney failure 01/09/2024   Acute urinary retention 01/08/2024   S/P bilateral BKA (below knee amputation) (HCC) 01/07/2024   Morbid obesity with BMI of 70 and over, adult (HCC) 11/28/2023   Aortic atherosclerosis 11/28/2023   CKD (chronic kidney disease), stage III (HCC) 11/28/2023   Weakness 04/28/2023   Nephrosis 04/27/2023   AKI (acute kidney injury) 04/26/2023   OSA (obstructive sleep apnea) 04/26/2023   Hx of iron deficiency anemia 07/07/2022   Below-knee amputation of left lower extremity (HCC)    Bipolar disorder with depression (HCC)    Dyspnea 11/18/2021   Bicuspid aortic valve 09/14/2021   Aneurysm of ascending aorta 09/14/2021   DM (diabetes mellitus) with peripheral vascular complication (HCC) 08/20/2021   Essential hypertension 07/17/2021   Cellulitis of left lower leg 04/11/2021   Acute on chronic diastolic CHF  (congestive heart failure) (HCC) 07/22/2020   Acute kidney injury superimposed on stage 3b chronic kidney disease (HCC) 07/18/2020   Leukocytosis 05/31/2019   Smoker 03/08/2019   Chronic back pain 02/16/2019   BMI 70 and over, adult (HCC) 02/16/2019   Peripheral nerve disease 02/16/2019   COPD (chronic obstructive pulmonary disease) (HCC) 01/22/2019   Anxiety and depression 03/26/2018   Recurrent chest pain 07/29/2016   Hyperglycemia    Insulin  dependent type 2 diabetes mellitus (HCC) 04/29/2015   History of pulmonary embolus (PE) 04/27/2013   CAD (coronary artery disease) 09/10/2008   PCP:  Alec House, MD Pharmacy:   [facility] [place] [place] - [address] [place] [address] Phone: [phone] Fax: [phone]  [id number] - [name] - [address] AT [facility] OF [address] [place] [address] Phone: [phone] Fax: [phone]     Social Drivers of Health (SDOH) Social History: SDOH Screenings  Food Insecurity: No Food Insecurity (03/28/2024)  Housing: Low Risk (03/28/2024)  Transportation Needs: Unmet Transportation Needs (03/28/2024)  Utilities: Not At Risk (03/28/2024)  Financial Resource Strain: Medium Risk (09/09/2022)  Social Connections: Moderately Isolated (03/28/2024)  Stress: No Stress Concern Present (10/10/2023)   Received from [facility]  Tobacco Use: Medium Risk (12/31/2024)   SDOH Interventions:     Readmission Risk Interventions    05/03/2023   11:58 AM  Readmission Risk Prevention Plan  Transportation Screening Complete  PCP or Specialist Appt within 3-5 Days Complete  HRI or Home Care Consult Complete  Social Work Consult for Recovery Care Planning/Counseling Complete  Palliative Care Screening Not Applicable  Medication Review Oceanographer) Referral to Pharmacy

## 2025-01-10 NOTE — Progress Notes (Signed)
 PCCM interval progress note:  Stable off bipap, diuresing, appropriate for transfer out of the ICU.  I updated his wife who reported that he has had confusion since since his heart surgery, this was worse on admission, but has been an issue for at least 6 months if not longer.   He has not been evaluated for dementia by his PCP   Leita SAUNDERS Lucetta Baehr, PA-C

## 2025-01-10 NOTE — Inpatient Diabetes Management (Signed)
 Inpatient Diabetes Program Recommendations  AACE/ADA: New Consensus Statement on Inpatient Glycemic Control (2015)  Target Ranges:  Prepandial:   less than 140 mg/dL      Peak postprandial:   less than 180 mg/dL (1-2 hours)      Critically ill patients:  140 - 180 mg/dL   Lab Results  Component Value Date   GLUCAP 202 (H) 01/10/2025   HGBA1C 7.9 (H) 01/09/2025    Latest Reference Range & Units 01/09/25 22:40  Sodium 135 - 145 mmol/L 128 (L)  Potassium 3.5 - 5.1 mmol/L 6.6 (HH)  Chloride 98 - 111 mmol/L 95 (L)  CO2 22 - 32 mmol/L 14 (L)  Glucose 70 - 99 mg/dL 608 (H)  BUN 6 - 20 mg/dL 91 (H)  Creatinine 9.38 - 1.24 mg/dL 5.37 (H)  Calcium  8.9 - 10.3 mg/dL 8.7 (L)  Anion gap 5 - 15  20 (H)  (HH): Data is critically high (L): Data is abnormally low (H): Data is abnormally high  Latest Reference Range & Units 01/10/25 07:25  Sodium 135 - 145 mmol/L 134 (L)  Potassium 3.5 - 5.1 mmol/L 5.0  Chloride 98 - 111 mmol/L 100  CO2 22 - 32 mmol/L 23  Glucose 70 - 99 mg/dL 771 (H)  BUN 6 - 20 mg/dL 82 (H)  Creatinine 9.38 - 1.24 mg/dL 5.90 (H)  Calcium  8.9 - 10.3 mg/dL 8.6 (L)  Anion gap 5 - 15  11  (L): Data is abnormally low (H): Data is abnormally high  Latest Reference Range & Units 01/10/25 07:25  Beta-Hydroxybutyric Acid 0.05 - 0.27 mmol/L <0.05 (L)  (L): Data is abnormally low  Diabetes history: DM2 Outpatient Diabetes medications:  Lantus  50 units bid Humalog  12 units tid Metformin  1 gm daily Farxiga  10 mg daily Current orders for Inpatient glycemic control: IV insulin   Inpatient Diabetes Program Recommendations:   Please consider when transitioning to subcutaneous insulin : -Lantus  40 units bid (give first dose 2 hrs. Prior to D/C of insulin  drip) -Novolog  0-9 units q 4 hrs. (Cover CBG @ time of insulin  drip  -When eating- add Novolog  5 units tid meal coverage if eating 50% meal  Thank you, Othelia Riederer E. Eliyanah Elgersma, RN, MSN, CNS, CDCES  Diabetes Coordinator Inpatient Glycemic  Control Team Team Pager [phone] (8am-5pm) 01/10/2025 11:02 AM

## 2025-01-10 NOTE — Progress Notes (Signed)
 "    Advanced Heart Failure Rounding Note  Cardiologist: Oneil Parchment, MD  Chief Complaint: Chest pain Patient Profile   Joseph [name] is a 54 y.o. male with CAD s/p 4V CABG, PAD s/p bilateral BKA, DM, HTN, HLD, OSA, morbid obesity and prior DVT/PE.   Significant events:   1/21: Admitted with CP. LHC with severe native heart vessel disease and x4 patent grafts.  Subjective:    Resting comfortably in bed on BiPAP.   Objective:   Weight Range: (!) 170 kg Body mass index is 70.81 kg/m.   Vital Signs:   Temp:  [98.8 F (37.1 C)-100.1 F (37.8 C)] 99 F (37.2 C) (01/22 0930) Pulse Rate:  [86-120] 92 (01/22 0930) Resp:  [10-26] 12 (01/22 0930) BP: (94-157)/(68-112) 102/74 (01/22 0930) SpO2:  [89 %-100 %] 99 % (01/22 0930) FiO2 (%):  [40 %] 40 % (01/22 0742) Weight:  [170 kg] 170 kg (01/22 0415) Last BM Date :  (pta)  Weight change: Filed Weights   01/09/25 2200 01/10/25 0415  Weight: (!) 170 kg (!) 170 kg    Intake/Output:   Intake/Output Summary (Last 24 hours) at 01/10/2025 1012 Last data filed at 01/10/2025 0900 Gross per 24 hour  Intake 3579.72 ml  Output 4065 ml  Net -485.28 ml     Physical Exam   General:  chronically ill appearing.   Cor: Regular rate & rhythm. No murmurs. JVD UTA.  Lungs: diminished Extremities: no edema. BL BKA  Telemetry   NSR 80s (Personally reviewed)    Labs   CBC Recent Labs    01/09/25 1920 01/09/25 2240 01/09/25 2249 01/10/25 0148  WBC 14.0* 12.9*  --  11.7*  NEUTROABS 11.8*  --   --   --   HGB 10.0* 10.0* 9.5* 9.3*  HCT 31.2* 31.4* 28.0* 29.1*  MCV 84.1 85.6  --  83.6  PLT 299 297  --  259   Basic Metabolic Panel Recent Labs    98/78/73 2240 01/09/25 2249 01/10/25 0148 01/10/25 0314 01/10/25 0725  NA 128*   < > 133*  --  134*  K 6.6*   < > 5.5* 5.1 5.0  CL 95*  --  98  --  100  CO2 14*  --  21*  --  23  GLUCOSE 391*  --  302*  --  228*  BUN 91*  --  88*  --  82*  CREATININE 4.62*  --  4.42*  --   4.09*  CALCIUM  8.7*  --  8.6*  --  8.6*  MG 1.8  --   --   --   --   PHOS 4.2  --   --   --   --    < > = values in this interval not displayed.   Liver Function Tests Recent Labs    01/09/25 1920  AST 21  ALT 23  ALKPHOS 134*  BILITOT 0.7  PROT 7.4  ALBUMIN  3.0*   Recent Labs    01/09/25 2240  LIPASE 19   Cardiac Enzymes No results for input(s): CKTOTAL, CKMB, CKMBINDEX, TROPONINI in the last 72 hours.  BNP: BNP (last 3 results) Recent Labs    03/27/24 2210  BNP 90.4    ProBNP (last 3 results) Recent Labs    04/27/24 1051 12/17/24 1056  PROBNP 895* 285*     D-Dimer No results for input(s): DDIMER in the last 72 hours. Hemoglobin A1C Recent Labs    01/09/25 1920  HGBA1C 7.9*   Fasting Lipid Panel Recent Labs    01/09/25 1920  CHOL 110  HDL 18*  LDLCALC 51  TRIG 794*  CHOLHDL 6.1   Medications:   Scheduled Medications:  aspirin  EC  81 mg Oral q AM   atorvastatin   80 mg Oral Daily   buPROPion   300 mg Oral q morning   Chlorhexidine  Gluconate Cloth  6 each Topical Daily   clopidogrel   75 mg Oral Daily   escitalopram   10 mg Oral Daily   furosemide   80 mg Intravenous BID   heparin   5,000 Units Subcutaneous Q8H   sodium chloride  flush  3 mL Intravenous Q12H   sodium zirconium cyclosilicate   10 g Oral Once   vancomycin  variable dose per unstable renal function (pharmacist dosing)   Does not apply See admin instructions    Infusions:  sodium chloride      dextrose  5% lactated ringers  125 mL/hr at 01/10/25 0900   insulin  6.5 Units/hr (01/10/25 0900)   lactated ringers  Stopped (01/10/25 0313)   piperacillin -tazobactam (ZOSYN )  IV 12.5 mL/hr at 01/10/25 0900    PRN Medications: sodium chloride , clonazePAM , dextrose , mouth rinse, polyethylene glycol, senna, sodium chloride  flush  Assessment/Plan   Chest pain CAD - LHC with severe native heart vessel disease and x4 patent grafts. LVEDP 8 - CP not though to be cardiac in nature -  HsTrop 51>50 - Continue ASA/plavix  - On ranexa  at home.  - Echo today  Hyperglycemia / DM - On lasix  gtt - mgmt per primary - Previously on Farxiga . Can consider restarting when SCr stabilizes.  - A1c 7.9  Hyperkalemia - K 6.7 on admission - Plan for Lokelma  today - Continue lasix    PAD s/p BKA - Wounds from crawling around on his stumps - WOC following  AKI on CKD 3b - SCr up to 4.74 this admission - Baseline 2-2.5 - Nephrology following - Follow with diuresis, avoid hypotension  HTN - BP soft/stable  HLD - LDL 51 - Continue statin  Morbid obesity - Body mass index is 70.81 kg/m.   Hx DVT/PE - 32' - previously on Xarelto , cannot find any recent fill records  Length of Stay: 1  Beckey LITTIE Coe, NP  01/10/2025, 10:12 AM  Advanced Heart Failure Team Pager [phone] (M-F; 7a - 5p)   Please visit [email or web address]: For overnight coverage please call cardiology fellow first. If fellow not available call Shock/ECMO MD on call.  For ECMO / Mechanical Support (Impella, IABP, LVAD) issues call Shock / ECMO MD on call.   "

## 2025-01-10 NOTE — Progress Notes (Signed)
 "  NAME:  Joseph [name], MRN:  [id number], DOB:  22-Aug-1971, LOS: 1 ADMISSION DATE:  01/09/2025, CONSULTATION DATE:  1/21 REFERRING MD:  Verlin, cardiology, CHIEF COMPLAINT:  chest pain   History of Present Illness:  54 year old male with past medical history of asthma, COPD, chronic bronchitis, morbid obesity, OSA, diabetes, GERD, HTN, HLD, PE, DVT on Xarelto , PAD s/p bilateral BKA, CAD s/p CABG x4 who initially presented to the emergency department with chest pain.   Complained of chest pain for 14 hours prior to presenation. EKG showing STE in V1 and v2, unchanged from prior. Per cardiology, was screaming in pain in ED, so elected to take him to cath lab. Cath showing x4 patent grafts, LVEDP 8-10. In the interim found to have multiple lab abnormalities and given his medical history CCM consulted for admission.   On my exam he is a bit lethargic, received fentanyl  and versed  in cath lab. His wife showed up during exam. She states he was tired all day yesterday. Did not get out of bed until 5pm. He then was tired again all day today, did not drink much and reportedly more confused this afternoon. States he yelled out in pain about his left shoulder, but has otherwise note had complaints. Additionally, she looked at his glucose monitor which was reading high. Denies having fevers, chills, shortness of breath, abdominal pain, n/v/d, cough at home.   Pertinent  Medical History  asthma, COPD, chronic bronchitis, morbid obesity, OSA, diabetes, GERD, HTN, HLD, PE, DVT on Xarelto , PAD s/p bilateral BKA, CAD s/p CABG x4  Significant Hospital Events: Including procedures, antibiotic start and stop dates in addition to other pertinent events   1/21: ED for CP>cath clean>ICU admit  1/22 stable overnight, remains on bipap this AM  Interim History / Subjective:  No acute overnight events   Objective   Blood pressure 100/76, pulse 87, temperature 99.1 F (37.3 C), resp. rate (!) 25, height 5' 1  (1.549 m), weight (!) 170 kg, SpO2 100%.    Vent Mode: BIPAP FiO2 (%):  [40 %] 40 % Set Rate:  [18 bmp] 18 bmp PEEP:  [5 cmH20] 5 cmH20   Intake/Output Summary (Last 24 hours) at 01/10/2025 9157 Last data filed at 01/10/2025 0700 Gross per 24 hour  Intake 3290.9 ml  Output 3400 ml  Net -109.1 ml   Filed Weights   01/09/25 2200 01/10/25 0415  Weight: (!) 170 kg (!) 170 kg    General: Obese, critically ill-appearing male resting in bed in no acute distress HEENT: MM pink/moist, tolerating BiPAP mask Neuro: Appears somewhat somnolent however awakens to verbal stimulation and follows commands, answering questions appropriately CV: s1s2 RRR, no m/r/g PULM: Diminished in the bilateral bases without rhonchi or wheezing, no tachypnea or accessory muscle use on BiPAP 10/5 GI: soft, nontender Extremities: Bilateral BKA with increased erythema and warmth and dressed skin breakdown on the right stump with 1+ edema   Resolved Hospital Problem list    Assessment & Plan:    Acute metabolic encephalopathy  Likely mixed uremia, CO2 narcosis, DKA. Also polypharmacy, on xanaflex, lyrica , oxycodone , wellbutrin , xanax   -improving with treating metabolic derangements and BIPAP -ammonia WNL - non focal, do not feel he needs CT head    Hyperglycemia Hyperkalemia  Hyponatremia  -BHOB not suggestive of DKA, but glu remains >200, continue endotool - K corrected w/ insulin /dextrose , sodium bicarb, calcium  gluconate and now 5.0, continue to trend    AKI on CKD  AGMA  Possibly  dehydration/pre-renal AKI on CKD  - UA bland, nephro consulted  -trend and replete electrolytes prn  - strict I&O - Avoid nephrotoxic agents, renally dose medications - ensure adequate renal perfusion   SIRS, query infection from his right leg/stump cellulitis - obtain UA, CXR, blood cultures, covid/flu/rsv, 20 pathogen panel  - 1L IVF, mucous membranes are dry  - empiric antibiotics with vanc/zosyn  for today and  then likely de-escalation tomorrow   COPD  Chronic bronchitis Asthma  OSA on CPAP  -ABG ok, plan to trial off Bipap today   CAD s/p CABG x4 Left shoulder pain  Concern this was anginal pain, cath was clear. Doubt PE as no hypoxia  Most recent echo 03/2024 with EF 55-60%,, RV normal -echo today - cardiology following  - cath clean  - tele monitor  - asa/plavix  cont   History of PE, DVT  - not on home DOAC  -PE was remote in 2014  Depression  Anxiety   - continue wellbutrin  and lexapro   - prn klonopin    Chronica pain? - hold oxycodone , lyrica , xanaflex for now - add back PRN  Likely stable for transfer out of the ICU later today  Labs   CBC: Recent Labs  Lab 01/09/25 1920 01/09/25 2240 01/09/25 2249 01/10/25 0148  WBC 14.0* 12.9*  --  11.7*  NEUTROABS 11.8*  --   --   --   HGB 10.0* 10.0* 9.5* 9.3*  HCT 31.2* 31.4* 28.0* 29.1*  MCV 84.1 85.6  --  83.6  PLT 299 297  --  259    Basic Metabolic Panel: Recent Labs  Lab 01/09/25 1920 01/09/25 2240 01/09/25 2249 01/10/25 0148 01/10/25 0314 01/10/25 0725  NA 127* 128* 131* 133*  --  134*  K 6.7* 6.6* 5.9* 5.5* 5.1 5.0  CL 95* 95*  --  98  --  100  CO2 17* 14*  --  21*  --  23  GLUCOSE 452* 391*  --  302*  --  228*  BUN 92* 91*  --  88*  --  82*  CREATININE 4.74* 4.62*  --  4.42*  --  4.09*  CALCIUM  8.6* 8.7*  --  8.6*  --  8.6*  MG  --  1.8  --   --   --   --   PHOS  --  4.2  --   --   --   --    GFR: Estimated Creatinine Clearance: 30 mL/min (A) (by C-G formula based on SCr of 4.09 mg/dL (H)). Recent Labs  Lab 01/09/25 1920 01/09/25 2240 01/10/25 0148  WBC 14.0* 12.9* 11.7*  LATICACIDVEN  --  1.9  --     Liver Function Tests: Recent Labs  Lab 01/09/25 1920  AST 21  ALT 23  ALKPHOS 134*  BILITOT 0.7  PROT 7.4  ALBUMIN  3.0*   Recent Labs  Lab 01/09/25 2240  LIPASE 19   Recent Labs  Lab 01/09/25 2240  AMMONIA 17    ABG    Component Value Date/Time   PHART 7.330 (L)  01/09/2025 2249   PCO2ART 38.6 01/09/2025 2249   PO2ART 92 01/09/2025 2249   HCO3 20.3 01/09/2025 2249   TCO2 21 (L) 01/09/2025 2249   ACIDBASEDEF 5.0 (H) 01/09/2025 2249   O2SAT 96 01/09/2025 2249     Coagulation Profile: Recent Labs  Lab 01/09/25 1920  INR 1.4*    Cardiac Enzymes: No results for input(s): CKTOTAL, CKMB, CKMBINDEX, TROPONINI in the last 168 hours.  HbA1C: Hgb A1c MFr Bld  Date/Time Value Ref Range Status  01/09/2025 07:20 PM 7.9 (H) 4.8 - 5.6 % Final    Comment:    (NOTE) Diagnosis of Diabetes The following HbA1c ranges recommended by the American Diabetes Association (ADA) may be used as an aid in the diagnosis of diabetes mellitus.  Hemoglobin             Suggested A1C NGSP%              Diagnosis  <5.7                   Non Diabetic  5.7-6.4                Pre-Diabetic  >6.4                   Diabetic  <7.0                   Glycemic control for                       adults with diabetes.    01/07/2024 05:35 AM 8.1 (H) 4.8 - 5.6 % Final    Comment:    (NOTE) Pre diabetes:          5.7%-6.4%  Diabetes:              >6.4%  Glycemic control for   <7.0% adults with diabetes     CBG: Recent Labs  Lab 01/10/25 0208 01/10/25 0312 01/10/25 0413 01/10/25 0548 01/10/25 0638  GLUCAP 272* 195* 200* 215* 218*    Review of Systems:   As above   Past Medical History:  He,  has a past medical history of Aneurysm of ascending aorta (09/14/2021), Anxiety, Aortic atherosclerosis (11/28/2023), Arthritis, Asthma, Bicuspid aortic valve (09/14/2021), BMI 70 and over, adult [facility]) (02/16/2019), CAD (coronary artery disease), Chronic bronchitis (HCC), Chronic ulcer of great toe of right foot (HCC) (09/09/2017), Chronic ulcer of right great toe (HCC) (09/19/2017), COPD (chronic obstructive pulmonary disease) (HCC), Coronary arteriosclerosis, Deep venous thrombosis (HCC) (02/16/2019), Depression, Diabetes mellitus (HCC), DVT (deep venous  thrombosis) (HCC), GERD (gastroesophageal reflux disease), Hyperlipidemia, Hypertension, Migraine, Morbid obesity (HCC), Morbid obesity with BMI of 70 and over, adult (HCC) (11/28/2023), Myocardial infarction (HCC) (1996), Non-ST elevation (NSTEMI) myocardial infarction (HCC) (07/22/2020), PE (pulmonary embolism) (04/2013), Peripheral nerve disease, Pneumonia (several times), Sleep apnea, and Type II diabetes mellitus (HCC).   Surgical History:   Past Surgical History:  Procedure Laterality Date   ABDOMINAL AORTOGRAM W/LOWER EXTREMITY N/A 10/10/2020   Procedure: ABDOMINAL AORTOGRAM W/LOWER EXTREMITY;  Surgeon: Harvey Carlin BRAVO, MD;  [facility]: [facility] INVAS[facility] LAB;  Service: Cardiovascular;  Laterality: N/A;   AMPUTATION Right 09/21/2017   Procedure: RIGHT GREAT TOE AMPUTATION, POSSIBLE VAC;  Surgeon: Jerri Kay HERO, MD;  Location: [facility] OR;  Service: Orthopedics;  Laterality: Right;   AMPUTATION Left 08/13/2020   Procedure: LEFT FOOT 4TH RAY AMPUTATION;  Surgeon: Harden Jerona GAILS, MD;  Location: [facility] OR;  Service: Orthopedics;  Laterality: Left;   AMPUTATION Left 11/20/2021   Procedure: LEFT TRANSMETATARSAL AMPUTATION;  Surgeon: Harden Jerona GAILS, MD;  Location: [facility] OR;  Service: Orthopedics;  Laterality: Left;   AMPUTATION Left 01/08/2022   Procedure: LEFT BELOW KNEE AMPUTATION;  Surgeon: Harden Jerona GAILS, MD;  Location: [facility] OR;  Service: Orthopedics;  Laterality: Left;   AMPUTATION Right 07/07/2022   Procedure: RIGHT BELOW KNEE AMPUTATION;  Surgeon: Harden,  Jerona GAILS, MD;  Location: [facility] OR;  Service: Orthopedics;  Laterality: Right;   AORTOGRAM Bilateral 03/13/2021   Procedure: ABDOMINAL AORTOGRAM WITH Left LOWER EXTREMITY RUNOFF;  Surgeon: Magda Debby SAILOR, MD;  Location: [facility] OR;  Service: Vascular;  Laterality: Bilateral;   APPLICATION OF WOUND VAC  01/08/2022   Procedure: APPLICATION OF WOUND VAC;  Surgeon: Harden Jerona GAILS, MD;  Location: [facility] OR;  Service: Orthopedics;;   APPLICATION OF WOUND VAC Right 07/07/2022    Procedure: APPLICATION OF WOUND VAC;  Surgeon: Harden Jerona GAILS, MD;  Location: [facility] OR;  Service: Orthopedics;  Laterality: Right;   CARDIAC CATHETERIZATION  2006   CARDIAC CATHETERIZATION  1996   @ [facility]; when I had my heart attack   CARDIAC CATHETERIZATION N/A 07/29/2016   Procedure: Left Heart Cath and Coronary Angiography;  Surgeon: Dorn JINNY Lesches, MD;  Location: [facility] INVASIVE CV LAB;  Service: Cardiovascular;  Laterality: N/A;   CARDIAC CATHETERIZATION N/A 07/29/2016   Procedure: Coronary Stent Intervention;  Surgeon: Dorn JINNY Lesches, MD;  [facility]: [facility];  Service: Cardiovascular;  Laterality: N/A;   CARPAL TUNNEL RELEASE Bilateral    CORONARY ANGIOPLASTY WITH STENT PLACEMENT  07/29/2016   CORONARY ARTERY BYPASS GRAFT N/A 07/24/2020   Procedure: CORONARY ARTERY BYPASS GRAFTING (CABG), ON PUMP, TIMES FOUR, USING LEFT INTERNAL MAMMARY ARTERY AND ENDOSCOPICALLY HARVESTED RIGHT GREATER SAPHENOUS VEIN;  Surgeon: Shyrl Linnie KIDD, MD;  Location: [facility] OR;  Service: Open Heart Surgery;  Laterality: N/A;  FLOW TAC   CORONARY PRESSURE/FFR STUDY N/A 07/22/2020   Procedure: INTRAVASCULAR PRESSURE WIRE/FFR STUDY;  Surgeon: Anner Alm ORN, MD;  Location: [facility] INVASIVE CV LAB;  Service: Cardiovascular;  Laterality: N/A;   CORONARY STENT INTERVENTION N/A 05/30/2019   Procedure: CORONARY STENT INTERVENTION;  Surgeon: Claudene Victory ORN, MD;  [facility];  Service: Cardiovascular;  Laterality: N/A;   CORONARY STENT INTERVENTION N/A 11/02/2019   Procedure: CORONARY STENT INTERVENTION;  Surgeon: Darron Deatrice LABOR, MD;  [facility]: [facility];  Service: Cardiovascular;  Laterality: N/A;   ESOPHAGOGASTRODUODENOSCOPY N/A 09/22/2017   Procedure: ESOPHAGOGASTRODUODENOSCOPY (EGD);  Surgeon: Aneita Gwendlyn DASEN, MD;  Location: [facility] ENDOSCOPY;  Service: Endoscopy;  Laterality: N/A;   KNEE ARTHROSCOPY Bilateral    2 on left; 1 on the right   LEFT HEART CATH AND CORONARY ANGIOGRAPHY N/A  11/02/2019   Procedure: LEFT HEART CATH AND CORONARY ANGIOGRAPHY;  Surgeon: Darron Deatrice LABOR, MD;  [facility];  Service: Cardiovascular;  Laterality: N/A;   LEFT HEART CATH AND CORONARY ANGIOGRAPHY N/A 07/22/2020   Procedure: LEFT HEART CATH AND CORONARY ANGIOGRAPHY;  Surgeon: Anner Alm ORN, MD;  Location: [facility] INVASIVE CV LAB;  Service: Cardiovascular;  Laterality: N/A;   LEFT HEART CATH AND CORS/GRAFTS ANGIOGRAPHY N/A 08/17/2021   Procedure: LEFT HEART CATH AND CORS/GRAFTS ANGIOGRAPHY;  Surgeon: Mady Bruckner, MD;  [facility]: [facility];  Service: Cardiovascular;  Laterality: N/A;   LEFT HEART CATH AND CORS/GRAFTS ANGIOGRAPHY N/A 01/09/2025   Procedure: LEFT HEART CATH AND CORS/GRAFTS ANGIOGRAPHY;  Surgeon: Verlin Bruckner BIRCH, MD;  [facility]: [facility];  Service: Cardiovascular;  Laterality: N/A;   PERIPHERAL VASCULAR INTERVENTION Left 10/11/2020   popliteal and SFA stent placement    PERIPHERAL VASCULAR INTERVENTION Left 10/10/2020   Procedure: PERIPHERAL VASCULAR INTERVENTION;  Surgeon: Harvey Carlin BRAVO, MD;  [facility];  Service: Cardiovascular;  Laterality: Left;   RIGHT/LEFT HEART CATH AND CORONARY ANGIOGRAPHY N/A 05/30/2019   Procedure:  RIGHT/LEFT HEART CATH AND CORONARY ANGIOGRAPHY;  Surgeon: Claudene Victory ORN, MD;  Location: [facility] INVASIVE CV LAB;  Service: Cardiovascular;  Laterality: N/A;   RIGHT/LEFT HEART CATH AND CORONARY/GRAFT ANGIOGRAPHY N/A 06/17/2022   Procedure: RIGHT/LEFT HEART CATH AND CORONARY/GRAFT ANGIOGRAPHY;  Surgeon: Burnard Debby LABOR, MD;  [facility];  Service: Cardiovascular;  Laterality: N/A;   SHOULDER OPEN ROTATOR CUFF REPAIR Bilateral    TEE WITHOUT CARDIOVERSION N/A 07/24/2020   Procedure: TRANSESOPHAGEAL ECHOCARDIOGRAM (TEE);  Surgeon: Shyrl Linnie KIDD, MD;  Location: [facility] OR;  Service: Open Heart Surgery;  Laterality: N/A;     Social History:   reports that he quit smoking about 4 years ago.  His smoking use included cigarettes. He started smoking about 38 years ago. He has a 34 pack-year smoking history. He has quit using smokeless tobacco.  His smokeless tobacco use included snuff and chew. He reports current alcohol use. He reports that he does not use drugs.   Family History:  His family history includes Diabetes in an other family member; Hyperlipidemia in an other family member; Hypertension in his brother and father.   Allergies Allergies[1]   Home Medications  Prior to Admission medications  Medication Sig Start Date End Date Taking? Authorizing Provider  acetaminophen  (TYLENOL ) 500 MG tablet Take 1,000 mg by mouth 3 (three) times daily as needed for headache.    [provider]  albuterol  (VENTOLIN  HFA) 108 (90 Base) MCG/ACT inhaler Inhale 2 puffs into the lungs every 6 (six) hours as needed for shortness of breath.    [provider]  alprazolam  (XANAX ) 2 MG tablet Take 1 tablet (2 mg total) by mouth 2 (two) times daily. 01/01/25     aspirin  EC 81 MG tablet Take 81 mg by mouth in the morning. Swallow whole. Patient taking differently: Take 81 mg by mouth daily. Swallow whole.    [provider]  atorvastatin  (LIPITOR ) 80 MG tablet Take 1 tablet (80 mg total) by mouth daily. 04/27/24   Wyn Jackee VEAR Mickey., NP  buPROPion  (WELLBUTRIN  XL) 300 MG 24 hr tablet Take 1 tablet (300 mg total) by mouth every morning. 11/07/24     cephALEXin  (KEFLEX ) 500 MG capsule Take 1 capsule (500 mg total) by mouth 2 (two) times daily for 10 days. 05/10/24     clopidogrel  (PLAVIX ) 75 MG tablet Take 1 tablet (75 mg total) by mouth daily. 05/10/24   Wyn Jackee VEAR Mickey., NP  Continuous Glucose Sensor (FREESTYLE LIBRE 3 PLUS SENSOR) MISC Use as directed to monitor blood sugar. Change every 15 days. 04/19/24     escitalopram  (LEXAPRO ) 10 MG tablet Take 10 mg by mouth daily. 01/27/22   [provider]  fluticasone  (FLONASE ) 50 MCG/ACT nasal spray Place 2 sprays into both  nostrils daily.    [provider]  fluticasone  (FLONASE ) 50 MCG/ACT nasal spray Place 1 spray into both nostrils 2 (two) times daily. 07/27/24     furosemide  (LASIX ) 80 MG tablet Take 1 tablet (80 mg total) by mouth 2 (two) times daily. 04/27/24   Wyn Jackee VEAR Mickey., NP  icosapent  Ethyl (VASCEPA ) 1 g capsule Take 2 capsules (2 g total) by mouth 2 (two) times daily. 12/19/24   Jeffrie Oneil BROCKS, MD  insulin  aspart (NOVOLOG ) 100 UNIT/ML injection Inject 8 Units into the skin 3 (three) times daily with meals. Hold if eats less than 50% of meals 01/14/24   Jillian Buttery, MD  insulin  glargine (LANTUS  SOLOSTAR) 100 UNIT/ML Solostar Pen  Inject 38 Units into the skin 2 (two) times daily. Patient taking differently: Inject 38-45 Units into the skin 2 (two) times daily. Takes 50 units twice daily 01/14/24   Jillian Buttery, MD  insulin  lispro (HUMALOG  KWIKPEN) 100 UNIT/ML KwikPen Inject 12 Units into the skin 3 (three) times daily before meals. Can take up to 45 units per day. 10/30/24     isosorbide  mononitrate (IMDUR ) 30 MG 24 hr tablet Take 1/2 tablet (15 mg total) by mouth daily. 12/17/24   Campbell, Kenzie E, NP  ketotifen  (ZADITOR ) 0.035 % ophthalmic solution Place 1 drop into the left eye 2 (two) times daily. 12/03/23   Tobie Yetta HERO, MD  lidocaine  (LIDODERM ) 5 % Place 1 patch to each stump once daily, remove after 12 hours. 04/19/24     loratadine  (CLARITIN ) 10 MG tablet Take 10 mg by mouth daily. 09/27/22   [provider]  loratadine  (CLARITIN ) 10 MG tablet Take 1 tablet (10 mg total) by mouth daily. 07/27/24     losartan -hydrochlorothiazide  (HYZAAR ) 100-25 MG tablet Take 1 tablet by mouth daily. 12/04/24     midodrine  (PROAMATINE ) 5 MG tablet Take 1 tablet (5 mg total) by mouth 2 (two) times daily with a meal. 04/27/24   Wyn Jackee VEAR Mickey., NP  naloxone  (NARCAN ) nasal spray 4 mg/0.1 mL Place 4 mg into the nose as needed (overdose).    [provider]  naloxone  (NARCAN ) nasal spray 4  mg/0.1 mL Place 1 spray into the nose as needed and seek medical follow-up. 06/13/24     nitroGLYCERIN  (NITROSTAT ) 0.4 MG SL tablet Place 1 tablet (0.4 mg total) under the tongue every 5 (five) minutes as needed. 12/17/24   Campbell, Kenzie E, NP  ondansetron  (ZOFRAN -ODT) 8 MG disintegrating tablet Place 1 tablet on the tongue and allow to dissolve once daily as needed. 12/06/24     oxyCODONE  (ROXICODONE ) 15 MG immediate release tablet Take 1 tablet (15 mg total) by mouth 4 (four) to 5 (five) times daily. 12/06/24     oxyCODONE  (ROXICODONE ) 15 MG immediate release tablet Take 1 tablet (15 mg total) by mouth 4 to 5 times daily as needed. 01/01/25     pantoprazole  (PROTONIX ) 40 MG tablet Take 1 tablet (40 mg total) by mouth daily. 03/26/24     pantoprazole  (PROTONIX ) 40 MG tablet Take 1 tablet (40 mg total) by mouth 2 (two) times daily. 07/09/24     prednisoLONE  acetate (PRED FORTE ) 1 % ophthalmic suspension Place 1 drop into the left eye 4 (four) times daily. 02/19/24   [provider]  pregabalin  (LYRICA ) 100 MG capsule Take 1 capsule (100 mg total) by mouth 2 (two) times daily. 04/06/24   Arrien, Elidia Sieving, MD  pregabalin  (LYRICA ) 150 MG capsule Take 2 capsules (300 mg total) by mouth 2 (two) times daily. 11/07/24     pregabalin  (LYRICA ) 150 MG capsule Take 2 capsules (300 mg total) by mouth 2 (two) times daily. 12/06/24     pregabalin  (LYRICA ) 150 MG capsule Take 2 capsules (300 mg total) by mouth 2 (two) times daily. 01/01/25     promethazine  (PHENERGAN ) 25 MG tablet Take 25 mg by mouth every 4 (four) hours as needed for nausea or vomiting. 09/10/22   [provider]  ranolazine  (RANEXA ) 1000 MG SR tablet TAKE 1 TABLET(1000 MG) BY MOUTH TWICE DAILY 10/29/24   Jeffrie Oneil BROCKS, MD  silver  sulfADIAZINE  (SILVADENE ) 1 % cream Apply 1 Application topically daily until healed. 05/10/24  tiZANidine  (ZANAFLEX ) 4 MG tablet Take 4 mg by mouth 3 (three) times daily. 11/25/23   [provider]     Critical care time:       Leita SAUNDERS Benigno Check, PA-C [facility] See Amion for pager If no response to pager , please call [phone] until 7pm            [1]  Allergies Allergen Reactions   Nsaids Other (See Comments)    Contraindication due to CKD    Duloxetine Other (See Comments)    Headaches   Sulfa  Antibiotics Other (See Comments)    Headaches    "

## 2025-01-10 NOTE — Progress Notes (Signed)
 CCM Cover  Labs with hyperglycemia without DKA as BHB is normal, urine without ketones. Still placed on hyperglycemia insulin  gtt for now.   Repeat K is still 6.6 after temporizing with 1 amp sodium bicarb, ca gluconate, 5U insulin /dextrose . Gave lasix  120mg  and LR 1500 total.  Nephrology was consulted He is making urine, foley was placed for retention.  Repeat labs with worsening acid base imbalance. Giving 2 more amps bicarb now and repeating insulin /dextrose  bolus, lokelma . If not may need to place on CRRT to correct.   Joseph FORBES Furth, PA-C [facility] 01/10/25 1:43 AM  Please see [email or web address] for pager details. From 7A-7P if no response, please call [phone]

## 2025-01-10 NOTE — Progress Notes (Signed)
 Patient ID: Joseph [name], male   DOB: Jan 15, 1971, 54 y.o.   MRN: [id number] [facility] Progress Note   Assessment/ Plan:   1. Acute kidney Injury on chronic kidney disease stage IIIb (baseline creatinine 2.2-2.5): Likely multifactorial acute kidney injury in the setting of urine retention/postrenal mechanism, hemodynamic renal injury from ongoing medications and recent contrast exposure.  He remains oliguric with some improvement of renal function as seen on labs this morning.  No acute indication for dialysis at this time and will continue to monitor labs/urine output for additional management.  Urine output augmented with furosemide . 2.  Hyperkalemia: Secondary to acute kidney injury, monitor on Lokelma /ongoing furosemide . 3.  Coronary artery disease status post four-vessel CABG: Presentation with chest pain and underwent coronary angiography with high risk of suspicion for ACS.  Patent grafts noted. 4.  Hyperglycemia: On insulin  drip along with dextrose  per management protocol.  Subjective:   Without acute events overnight following removal of femoral sheath.   Objective:   BP 100/76   Pulse 87   Temp 99.1 F (37.3 C)   Resp (!) 25   Ht 5' 1 (1.549 m)   Wt (!) 170 kg   SpO2 100%   BMI 70.81 kg/m   Intake/Output Summary (Last 24 hours) at 01/10/2025 0747 Last data filed at 01/10/2025 0700 Gross per 24 hour  Intake 3290.9 ml  Output 3400 ml  Net -109.1 ml   Weight change:   Physical Exam: Gen: Appears comfortable resting in bed, on BiPAP CVS: Pulse regular rhythm, normal rate, S1 and S2 normal Resp: Diminished breath sounds over bases, no distinct rales or rhonchi Abd: Soft, obese, nontender, bowel sounds normal Ext: Status post bilateral BKA with pitting edema over stumps  Imaging: DG CHEST PORT 1 VIEW Result Date: 01/09/2025 EXAM: 1 VIEW(S) XRAY OF THE CHEST 01/09/2025 09:12:00 PM COMPARISON: 12/18/2024. CLINICAL HISTORY: Chest pain. Prior CABG. FINDINGS:  LUNGS AND PLEURA: Low lung volumes with vascular congestion and bibasilar atelectasis. No pleural effusion. No pneumothorax. HEART AND MEDIASTINUM: No acute abnormality of the cardiac and mediastinal silhouettes. BONES AND SOFT TISSUES: No acute osseous abnormality. IMPRESSION: 1. Low lung volumes with vascular congestion and bibasilar atelectasis. Electronically signed by: Franky Crease MD 01/09/2025 09:18 PM EST RP Workstation: [id number]   CARDIAC CATHETERIZATION Result Date: 01/09/2025   Mid LM to Ost LAD lesion is 80% stenosed with 40% stenosed side branch in Ost Cx to Prox Cx.   Mid LAD lesion is 50% stenosed.   Mid Cx-2 lesion is 5% stenosed.   Dist Cx lesion is 70% stenosed.   Mid Cx-1 lesion is 100% stenosed.   Prox RCA to Mid RCA lesion is 30% stenosed.   Dist RCA lesion is 50% stenosed with 50% stenosed side branch in RPDA.   Prox RCA lesion is 80% stenosed.   Prox Graft to Mid Graft lesion is 30% stenosed.   1st Diag lesion is 40% stenosed.   3rd Diag lesion is 60% stenosed.   SVG graft was visualized by angiography and is large.   SVG graft was visualized by angiography and is normal in caliber.   SVG graft was visualized by angiography and is large.   LIMA graft was visualized by angiography and is normal in caliber. Severe distal left main disease Occlusion of the proximal to mid LAD. Patent LIMA to mid LAD. Patent SVG to Diagonal. Occlusion of the mid Circumflex. Patent SVG to obtuse marginal branch Diffuse disease in the stented segment of the  mid RCA. Patent SVG to RCA LVEDP=8 mmHg Recommendations: Stable CAD without focal lesion to explain his chest pain. Admit to ICU. He will need an echo tomorrow. PCCM team to admit.    Labs: BMET Recent Labs  Lab 01/09/25 1920 01/09/25 2240 01/09/25 2249 01/10/25 0148 01/10/25 0314  NA 127* 128* 131* 133*  --   K 6.7* 6.6* 5.9* 5.5* 5.1  CL 95* 95*  --  98  --   CO2 17* 14*  --  21*  --   GLUCOSE 452* 391*  --  302*  --   BUN 92* 91*  --  88*   --   CREATININE 4.74* 4.62*  --  4.42*  --   CALCIUM  8.6* 8.7*  --  8.6*  --   PHOS  --  4.2  --   --   --    CBC Recent Labs  Lab 01/09/25 1920 01/09/25 2240 01/09/25 2249 01/10/25 0148  WBC 14.0* 12.9*  --  11.7*  NEUTROABS 11.8*  --   --   --   HGB 10.0* 10.0* 9.5* 9.3*  HCT 31.2* 31.4* 28.0* 29.1*  MCV 84.1 85.6  --  83.6  PLT 299 297  --  259    Medications:     aspirin  EC  81 mg Oral q AM   atorvastatin   80 mg Oral Daily   buPROPion   300 mg Oral q morning   Chlorhexidine  Gluconate Cloth  6 each Topical Daily   clopidogrel   75 mg Oral Daily   escitalopram   10 mg Oral Daily   heparin   5,000 Units Subcutaneous Q8H   sodium chloride  flush  3 mL Intravenous Q12H   sodium zirconium cyclosilicate   10 g Oral Once   vancomycin  variable dose per unstable renal function (pharmacist dosing)   Does not apply See admin instructions    Gordy Blanch, MD 01/10/2025, 7:47 AM

## 2025-01-10 NOTE — Consult Note (Signed)
" °  CLINICAL SUPPORT TEAM - WOUND OSTOMY AND CONTINENCE TEAM  CONSULTATION SERVICES   WOC Nurse-Inpatient Note   WOC Nurse Consult Note: Reason for Consult:Consulted for wounds on lower extremities.  Bilateral BKA. He walks on his stumps to get to the rest room and climbing stairs in his house daily. He states he can't change where he lives or how he functions. He has 2 ulcers on the left stump and 1 on the right. He does wear black compression stump socks. He has a motorized chair for mobility out side his home.  Wound type: Trauma wounds to bilateral lower extremities  Pressure Injury POA: NA Measurement: see nursing flowsheet Wound bed: red and moist  Drainage (amount, consistency, odor) minimal serosanguinous No odor Periwound: Dry skin  Dressing procedure/placement/frequency: Cleanse wounds to bilateral lower extremities with VASHE WD# [id number] and air dry.  Apply alginate to wound bed.  Cover with gauze or foam. Wrap with kerlix and tape. Change daily.  Will not follow at this time.  Please re-consult if needed.  Darice Cooley MSN, RN, FNP-BC CWON Wound, Ostomy, Continence Nurse [facility] [phone] Work cell phone:  [phone]   CLINICAL SUPPORT TEAM - WOUND OSTOMY AND CONTINENCE TEAM  CONSULTATION SERVICES   WOC Nurse-Inpatient Note       "

## 2025-01-10 NOTE — Progress Notes (Signed)
 6 Fr right femoral sheath removed per order at 0500 with 30 minutes of continuous manual pressure. Site is level 0, no palpable hematoma, no changes in vital signs. Patient tolerated removal & pressure well. Gauze and transparent dressing placed afterwards.   Diesha Rostad Cruz-Reyes, RN

## 2025-01-10 NOTE — Progress Notes (Signed)
" °  Echocardiogram 2D Echocardiogram has been performed.  Koleen KANDICE Popper, RDCS 01/10/2025, 1:30 PM "

## 2025-01-11 ENCOUNTER — Other Ambulatory Visit (HOSPITAL_COMMUNITY): Payer: Self-pay

## 2025-01-11 ENCOUNTER — Inpatient Hospital Stay (HOSPITAL_COMMUNITY)

## 2025-01-11 DIAGNOSIS — R079 Chest pain, unspecified: Secondary | ICD-10-CM

## 2025-01-11 DIAGNOSIS — I213 ST elevation (STEMI) myocardial infarction of unspecified site: Secondary | ICD-10-CM

## 2025-01-11 DIAGNOSIS — N179 Acute kidney failure, unspecified: Secondary | ICD-10-CM | POA: Diagnosis not present

## 2025-01-11 DIAGNOSIS — L0390 Cellulitis, unspecified: Secondary | ICD-10-CM | POA: Diagnosis not present

## 2025-01-11 LAB — GLUCOSE, CAPILLARY
Glucose-Capillary: 149 mg/dL — ABNORMAL HIGH (ref 70–99)
Glucose-Capillary: 151 mg/dL — ABNORMAL HIGH (ref 70–99)
Glucose-Capillary: 153 mg/dL — ABNORMAL HIGH (ref 70–99)
Glucose-Capillary: 158 mg/dL — ABNORMAL HIGH (ref 70–99)
Glucose-Capillary: 174 mg/dL — ABNORMAL HIGH (ref 70–99)
Glucose-Capillary: 176 mg/dL — ABNORMAL HIGH (ref 70–99)
Glucose-Capillary: 181 mg/dL — ABNORMAL HIGH (ref 70–99)
Glucose-Capillary: 184 mg/dL — ABNORMAL HIGH (ref 70–99)
Glucose-Capillary: 186 mg/dL — ABNORMAL HIGH (ref 70–99)
Glucose-Capillary: 189 mg/dL — ABNORMAL HIGH (ref 70–99)
Glucose-Capillary: 189 mg/dL — ABNORMAL HIGH (ref 70–99)
Glucose-Capillary: 204 mg/dL — ABNORMAL HIGH (ref 70–99)
Glucose-Capillary: 220 mg/dL — ABNORMAL HIGH (ref 70–99)
Glucose-Capillary: 245 mg/dL — ABNORMAL HIGH (ref 70–99)

## 2025-01-11 LAB — CBC
HCT: 29.7 % — ABNORMAL LOW (ref 39.0–52.0)
Hemoglobin: 9.8 g/dL — ABNORMAL LOW (ref 13.0–17.0)
MCH: 27.6 pg (ref 26.0–34.0)
MCHC: 33 g/dL (ref 30.0–36.0)
MCV: 83.7 fL (ref 80.0–100.0)
Platelets: 324 K/uL (ref 150–400)
RBC: 3.55 MIL/uL — ABNORMAL LOW (ref 4.22–5.81)
RDW: 15 % (ref 11.5–15.5)
WBC: 13.3 K/uL — ABNORMAL HIGH (ref 4.0–10.5)
nRBC: 0 % (ref 0.0–0.2)

## 2025-01-11 LAB — BASIC METABOLIC PANEL WITH GFR
Anion gap: 14 (ref 5–15)
BUN: 81 mg/dL — ABNORMAL HIGH (ref 6–20)
CO2: 23 mmol/L (ref 22–32)
Calcium: 8.6 mg/dL — ABNORMAL LOW (ref 8.9–10.3)
Chloride: 99 mmol/L (ref 98–111)
Creatinine, Ser: 3.86 mg/dL — ABNORMAL HIGH (ref 0.61–1.24)
GFR, Estimated: 18 mL/min — ABNORMAL LOW
Glucose, Bld: 185 mg/dL — ABNORMAL HIGH (ref 70–99)
Potassium: 5.3 mmol/L — ABNORMAL HIGH (ref 3.5–5.1)
Sodium: 136 mmol/L (ref 135–145)

## 2025-01-11 LAB — BLOOD GAS, VENOUS
Acid-base deficit: 1 mmol/L (ref 0.0–2.0)
Bicarbonate: 24.3 mmol/L (ref 20.0–28.0)
Drawn by: 67894
O2 Saturation: 95.6 %
Patient temperature: 37
pCO2, Ven: 42 mmHg — ABNORMAL LOW (ref 44–60)
pH, Ven: 7.37 (ref 7.25–7.43)
pO2, Ven: 70 mmHg — ABNORMAL HIGH (ref 32–45)

## 2025-01-11 LAB — VANCOMYCIN, RANDOM: Vancomycin Rm: 16 ug/mL

## 2025-01-11 LAB — MAGNESIUM: Magnesium: 1.8 mg/dL (ref 1.7–2.4)

## 2025-01-11 MED ORDER — OXYCODONE HCL 5 MG PO TABS
5.0000 mg | ORAL_TABLET | ORAL | Status: DC | PRN
  Administered 2025-01-11: 5 mg via ORAL
  Filled 2025-01-11: qty 1

## 2025-01-11 MED ORDER — OXYCODONE HCL 5 MG PO TABS
10.0000 mg | ORAL_TABLET | ORAL | Status: DC | PRN
  Administered 2025-01-11 – 2025-01-12 (×3): 10 mg via ORAL
  Filled 2025-01-11 (×3): qty 2

## 2025-01-11 MED ORDER — INSULIN GLARGINE 100 UNIT/ML ~~LOC~~ SOLN
40.0000 [IU] | Freq: Every day | SUBCUTANEOUS | Status: DC
  Administered 2025-01-11: 40 [IU] via SUBCUTANEOUS
  Filled 2025-01-11 (×2): qty 0.4

## 2025-01-11 MED ORDER — OXYCODONE HCL 5 MG PO TABS
5.0000 mg | ORAL_TABLET | ORAL | Status: DC | PRN
  Administered 2025-01-11: 10 mg via ORAL
  Filled 2025-01-11: qty 2

## 2025-01-11 MED ORDER — INSULIN ASPART 100 UNIT/ML IJ SOLN
0.0000 [IU] | Freq: Every day | INTRAMUSCULAR | Status: AC
  Administered 2025-01-11: 2 [IU] via SUBCUTANEOUS
  Administered 2025-01-12 – 2025-01-16 (×4): 3 [IU] via SUBCUTANEOUS
  Administered 2025-01-18: 4 [IU] via SUBCUTANEOUS
  Administered 2025-01-19: 3 [IU] via SUBCUTANEOUS
  Administered 2025-01-22: 4 [IU] via SUBCUTANEOUS
  Administered 2025-01-23: 2 [IU] via SUBCUTANEOUS
  Administered 2025-01-25: 3 [IU] via SUBCUTANEOUS
  Filled 2025-01-11: qty 1
  Filled 2025-01-11: qty 3
  Filled 2025-01-11: qty 2
  Filled 2025-01-11 (×2): qty 1
  Filled 2025-01-11: qty 3
  Filled 2025-01-11 (×2): qty 1
  Filled 2025-01-11: qty 4

## 2025-01-11 MED ORDER — VANCOMYCIN HCL 1500 MG/300ML IV SOLN
1500.0000 mg | Freq: Once | INTRAVENOUS | Status: AC
  Administered 2025-01-11: 1500 mg via INTRAVENOUS
  Filled 2025-01-11: qty 300

## 2025-01-11 MED ORDER — ENSURE PLUS HIGH PROTEIN PO LIQD
237.0000 mL | Freq: Two times a day (BID) | ORAL | Status: AC
  Administered 2025-01-12 – 2025-01-25 (×16): 237 mL via ORAL

## 2025-01-11 MED ORDER — INSULIN ASPART 100 UNIT/ML IJ SOLN
0.0000 [IU] | Freq: Three times a day (TID) | INTRAMUSCULAR | Status: AC
  Administered 2025-01-11: 3 [IU] via SUBCUTANEOUS
  Administered 2025-01-12 (×3): 8 [IU] via SUBCUTANEOUS
  Administered 2025-01-13 (×3): 5 [IU] via SUBCUTANEOUS
  Administered 2025-01-14: 8 [IU] via SUBCUTANEOUS
  Administered 2025-01-14: 11 [IU] via SUBCUTANEOUS
  Administered 2025-01-14: 5 [IU] via SUBCUTANEOUS
  Administered 2025-01-15: 8 [IU] via SUBCUTANEOUS
  Administered 2025-01-15 (×2): 5 [IU] via SUBCUTANEOUS
  Administered 2025-01-16: 3 [IU] via SUBCUTANEOUS
  Administered 2025-01-16: 8 [IU] via SUBCUTANEOUS
  Administered 2025-01-16: 5 [IU] via SUBCUTANEOUS
  Administered 2025-01-17: 15 [IU] via SUBCUTANEOUS
  Administered 2025-01-17: 8 [IU] via SUBCUTANEOUS
  Administered 2025-01-17: 3 [IU] via SUBCUTANEOUS
  Administered 2025-01-18: 8 [IU] via SUBCUTANEOUS
  Administered 2025-01-18: 3 [IU] via SUBCUTANEOUS
  Administered 2025-01-18 – 2025-01-19 (×3): 8 [IU] via SUBCUTANEOUS
  Administered 2025-01-19: 5 [IU] via SUBCUTANEOUS
  Administered 2025-01-20: 3 [IU] via SUBCUTANEOUS
  Administered 2025-01-21: 8 [IU] via SUBCUTANEOUS
  Administered 2025-01-21: 5 [IU] via SUBCUTANEOUS
  Administered 2025-01-21: 11 [IU] via SUBCUTANEOUS
  Administered 2025-01-22: 2 [IU] via SUBCUTANEOUS
  Administered 2025-01-22: 8 [IU] via SUBCUTANEOUS
  Administered 2025-01-22: 3 [IU] via SUBCUTANEOUS
  Administered 2025-01-23: 2 [IU] via SUBCUTANEOUS
  Administered 2025-01-23 (×2): 5 [IU] via SUBCUTANEOUS
  Administered 2025-01-24: 2 [IU] via SUBCUTANEOUS
  Administered 2025-01-24: 3 [IU] via SUBCUTANEOUS
  Administered 2025-01-24: 2 [IU] via SUBCUTANEOUS
  Administered 2025-01-25 (×2): 3 [IU] via SUBCUTANEOUS
  Filled 2025-01-11: qty 8
  Filled 2025-01-11: qty 3
  Filled 2025-01-11: qty 1
  Filled 2025-01-11: qty 2
  Filled 2025-01-11 (×2): qty 1
  Filled 2025-01-11: qty 5
  Filled 2025-01-11 (×2): qty 1
  Filled 2025-01-11: qty 3
  Filled 2025-01-11: qty 1
  Filled 2025-01-11: qty 5
  Filled 2025-01-11: qty 3
  Filled 2025-01-11 (×12): qty 1
  Filled 2025-01-11: qty 2
  Filled 2025-01-11: qty 1
  Filled 2025-01-11: qty 2
  Filled 2025-01-11 (×3): qty 1
  Filled 2025-01-11: qty 8
  Filled 2025-01-11: qty 3
  Filled 2025-01-11 (×5): qty 1
  Filled 2025-01-11: qty 2

## 2025-01-11 MED ORDER — SODIUM ZIRCONIUM CYCLOSILICATE 10 G PO PACK
10.0000 g | PACK | Freq: Once | ORAL | Status: AC
  Administered 2025-01-11: 10 g via ORAL
  Filled 2025-01-11: qty 1

## 2025-01-11 MED ORDER — MUPIROCIN 2 % EX OINT
1.0000 | TOPICAL_OINTMENT | Freq: Two times a day (BID) | CUTANEOUS | Status: AC
  Administered 2025-01-11 – 2025-01-15 (×10): 1 via NASAL
  Filled 2025-01-11 (×2): qty 22

## 2025-01-11 NOTE — TOC Initial Note (Signed)
 Transition of Care [facility]) - Initial/Assessment Note    Patient Details  Name: Joseph [name] MRN: [id number] Date of Birth: 01/19/1971  [facility] of [facility]) CM/SW Contact:    [name] Phone Number: [phone] 01/11/2025, 11:50 AM  Clinical Narrative:    HF CSW met with patient at bedside. Patient stated that he lives with spouse and parents at home. Patient stated that  he uses Medicaid for transportation. Patient stated that he has no history of HH services. Patient stated that he uses an electric wheel chair. Patient stated that he has a PCP. CSW explained that a hospital follow up appointment is typically scheduled closer towards dc. Patient is agreeable and requested afternoon.   HF CSW/CM will continue to follow and monitor for dc readiness.                Expected Discharge Plan: Home/Self Care Barriers to Discharge: Continued Medical Work up   Patient Goals and CMS Choice Patient states their goals for this hospitalization and ongoing recovery are:: go home CMS Medicare.gov Compare Post Acute Care list provided to:: Patient Choice offered to / list presented to : Patient [place] ownership interest in [facility].provided to:: Patient    Expected Discharge Plan and Services       Living arrangements for the past 2 months: Single Family Home                                      Prior Living Arrangements/Services Living arrangements for the past 2 months: Single Family Home Lives with:: Spouse, Parents Patient language and need for interpreter reviewed:: Yes Do you feel safe going back to the place where you live?: Yes      Need for Family Participation in Patient Care: Yes (Comment) Care giver support system in place?: Yes (comment)   Criminal Activity/Legal Involvement Pertinent to Current Situation/Hospitalization: No - Comment as needed  Activities of Daily Living      Permission Sought/Granted Permission sought to  share information with : Case Manager, Family Supports, PCP Permission granted to share information with : Yes, Verbal Permission Granted              Emotional Assessment Appearance:: Appears older than stated age Attitude/Demeanor/Rapport: Engaged Affect (typically observed): Appropriate Orientation: : Oriented to Self, Oriented to Place, Oriented to  Time, Oriented to Situation Alcohol / Substance Use: Not Applicable Psych Involvement: No (comment)  Admission diagnosis:  Chest pain [R07.9] Patient Active Problem List   Diagnosis Date Noted   Chest pain 01/09/2025   Pressure injury 04/05/2024   UTI (urinary tract infection) 04/04/2024   Peripheral edema 03/31/2024   Acute kidney failure 01/09/2024   Acute urinary retention 01/08/2024   S/P bilateral BKA (below knee amputation) (HCC) 01/07/2024   Morbid obesity with BMI of 70 and over, adult (HCC) 11/28/2023   Aortic atherosclerosis 11/28/2023   CKD (chronic kidney disease), stage III (HCC) 11/28/2023   Weakness 04/28/2023   Nephrosis 04/27/2023   AKI (acute kidney injury) 04/26/2023   OSA (obstructive sleep apnea) 04/26/2023   Hx of iron deficiency anemia 07/07/2022   Below-knee amputation of left lower extremity (HCC)    Bipolar disorder with depression (HCC)    Dyspnea 11/18/2021   Bicuspid aortic valve 09/14/2021   Aneurysm of ascending aorta 09/14/2021   DM (diabetes mellitus) with peripheral vascular complication (HCC) 08/20/2021  Essential hypertension 07/17/2021   Cellulitis of left lower leg 04/11/2021   Acute on chronic diastolic CHF (congestive heart failure) (HCC) 07/22/2020   Acute kidney injury superimposed on stage 3b chronic kidney disease (HCC) 07/18/2020   Leukocytosis 05/31/2019   Smoker 03/08/2019   Chronic back pain 02/16/2019   BMI 70 and over, adult (HCC) 02/16/2019   Peripheral nerve disease 02/16/2019   COPD (chronic obstructive pulmonary disease) (HCC) 01/22/2019   Anxiety and depression  03/26/2018   Recurrent chest pain 07/29/2016   Hyperglycemia    Insulin  dependent type 2 diabetes mellitus (HCC) 04/29/2015   History of pulmonary embolus (PE) 04/27/2013   CAD (coronary artery disease) 09/10/2008   PCP:  Alec House, MD Pharmacy:   [facility] [place] [place] - [address] [place] [address] Phone: [phone] Fax: [phone]  [id number] - [place], [place] - [address] AT [facility] [address] [name] [address] Phone: [phone] Fax: [phone]     Social Drivers of Health (SDOH) Social History: SDOH Screenings   Food Insecurity: No Food Insecurity (03/28/2024)  Housing: Low Risk (03/28/2024)  Transportation Needs: Unmet Transportation Needs (03/28/2024)  Utilities: Not At Risk (03/28/2024)  Financial Resource Strain: Medium Risk (09/09/2022)  Social Connections: Moderately Isolated (03/28/2024)  Stress: No Stress Concern Present (10/10/2023)   Received from [facility]  Tobacco Use: Medium Risk (12/31/2024)   SDOH Interventions:     Readmission Risk Interventions    05/03/2023   11:58 AM  Readmission Risk Prevention Plan  Transportation Screening Complete  PCP or Specialist Appt within 3-5 Days Complete  HRI or Home Care Consult Complete  Social Work Consult for Recovery Care Planning/Counseling Complete  Palliative Care Screening Not Applicable  Medication Review Oceanographer) Referral to Pharmacy

## 2025-01-11 NOTE — Progress Notes (Signed)
 PHARMACY ANTIBIOTIC CONSULT NOTE   Joseph [name] a 54 y.o. male admitted on 1/21 as code STEMI s/p cath with no targets, iSTAT labs with hyperkalemia (K 6.7), hyperglycemia and AKI (baseline ~2.3).  PMH significant for bilateral leg amputations - on presentation, R groin/groin red and hot to touch with concern for cellulitis vs sepsis.  Pharmacy has been consulted for Vancomycin  and Zosyn  dosing.  -WBC= 13.3, SCr 3.8, UOP  ~ in 24 hours -vancomycin  random this morning was 16 (~ 24 hours post load) -cultures- ngtd  Plan: -Zosyn  3.375gm IV q8h -Vancomycin  1500mg  IV x1 and check a random level in the morning.  -Will follow renal function, cultures and clinical progress    Allergies:  Allergies[1]  Filed Weights   01/09/25 2200 01/10/25 0415  Weight: (!) 170 kg (374 lb 12.5 oz) (!) 170 kg (374 lb 12.5 oz)       Latest Ref Rng & Units 01/11/2025    2:47 AM 01/10/2025    1:48 AM 01/09/2025   10:49 PM  CBC  WBC 4.0 - 10.5 K/uL 13.3  11.7    Hemoglobin 13.0 - 17.0 g/dL 9.8  9.3  9.5   Hematocrit 39.0 - 52.0 % 29.7  29.1  28.0   Platelets 150 - 400 K/uL 324  259      Antibiotics Given (last 72 hours)     Date/Time Action Medication Dose Rate   01/09/25 2307 New Bag/Given   piperacillin -tazobactam (ZOSYN ) IVPB 3.375 g 3.375 g 12.5 mL/hr   01/10/25 0202 New Bag/Given   vancomycin  (VANCOCIN ) 2,500 mg in sodium chloride  0.9 % 500 mL IVPB 2,500 mg 262.5 mL/hr   01/10/25 9365 New Bag/Given   piperacillin -tazobactam (ZOSYN ) IVPB 3.375 g 3.375 g 12.5 mL/hr   01/10/25 1428 New Bag/Given   piperacillin -tazobactam (ZOSYN ) IVPB 3.375 g 3.375 g 12.5 mL/hr   01/10/25 2111 New Bag/Given   piperacillin -tazobactam (ZOSYN ) IVPB 3.375 g 3.375 g 12.5 mL/hr   01/11/25 9380 New Bag/Given   piperacillin -tazobactam (ZOSYN ) IVPB 3.375 g 3.375 g 12.5 mL/hr       Antimicrobials this admission: Zosyn  1/21 > Vanc 1/21 >   Microbiology results: 1/21 Bcx: ngtd 1/21 MRSA PCR:  positive  Thank you for allowing pharmacy to be a part of this patients care.  Prentice Poisson, PharmD Clinical Pharmacist **Pharmacist phone directory can now be found on [email or web address] ([email or web address]).  Listed under [facility].        [1]  Allergies Allergen Reactions   Nsaids Other (See Comments)    Contraindication due to CKD    Duloxetine Other (See Comments)    Headaches   Sulfa  Antibiotics Other (See Comments)    Headaches

## 2025-01-11 NOTE — Consult Note (Signed)
" °  CLINICAL SUPPORT TEAM - WOUND OSTOMY AND CONTINENCE TEAM  CONSULTATION SERVICES   WOC Nurse-Inpatient Note   WOC consult requested for right stump.  This was already performed on 1/22; please refer to previous consult note for assessment, and plan of care has been provided for bedside nurses to perform.   Please re-consult if further assistance is needed.  Thank-you,  Stephane Fought MSN, RN, CWOCN, CWCN-AP, CNS Contact Mon-Fri 0700-1500: [phone]  "

## 2025-01-11 NOTE — Progress Notes (Addendum)
 " PROGRESS NOTE    Joseph [name]  FMW:[id number] DOB: 10/25/1971 DOA: 01/09/2025 PCP: Alec House, MD    Brief Narrative:  54 year old male with past medical history of asthma, COPD, chronic bronchitis, morbid obesity, OSA, diabetes, GERD, HTN, HLD, PE, DVT on Xarelto , PAD s/p bilateral BKA, CAD s/p CABG x4 who initially presented to the emergency department with chest pain.    Assessment and Plan: Acute metabolic encephalopathy  Likely mixed uremia, CO2 narcosis, DKA. Also polypharmacy, on xanaflex, lyrica , oxycodone , wellbutrin , xanax   -improving with treating metabolic derangements and BIPAP -continue to monitor     Hyperglycemia/Hyperkalemia /Hyponatremia  -tx on insulin  gtt -plan to change back to SQ insulin  after speaking with DM coordinator -lokelma      AKI on CKD /AGMA  Possibly dehydration/pre-renal AKI on CKD  - UA bland, nephro consult appreciated -strict I/Os -cath 1/21--? Worsened from contrast -foley in place-- d/c once CR stable   SIRS, query infection from his right leg/stump cellulitis - obtain UA, CXR, blood cultures, covid/flu/rsv, 20 pathogen panel -- negative thus far - empiric antibiotics with vanc/zosyn   -check x ray of stump      COPD  Chronic bronchitis Asthma  OSA on CPAP  -wean off O2 as able    CAD s/p CABG x4 Left shoulder pain  Concern this was anginal pain, cath was clear. Doubt PE as no hypoxia  Most recent echo 03/2024 with EF 55-60%,, RV normal -echo - cardiology signed off - cath clean  - asa/plavix  cont    History of PE, DVT  - not on home DOAC  -PE was remote in 2014   Depression  Anxiety   - continue wellbutrin  and lexapro     Chronica pain? - prn low dose of medications  Obesity Estimated body mass index is 70.81 kg/m as calculated from the following:   Height as of this encounter: 5' 1 (1.549 m).   Weight as of this encounter: 170 kg.    DVT prophylaxis: heparin  injection 5,000 Units Start: 01/09/25  2200    Code Status: Full Code   Disposition Plan:  Level of care: Progressive Status is: Inpatient     Consultants:  PCCM cards   Subjective: Pain in right stump  Objective: Vitals:   01/11/25 0453 01/11/25 0500 01/11/25 0600 01/11/25 0700  BP:  93/69 93/69 99/67   Pulse: 92 91 90 89  Resp:   17   Temp: (!) 100.6 F (38.1 C) (!) 100.8 F (38.2 C) (!) 100.6 F (38.1 C) 100.2 F (37.9 C)  TempSrc:      SpO2: 94% 97% 93% 92%  Weight:      Height:        Intake/Output Summary (Last 24 hours) at 01/11/2025 0819 Last data filed at 01/11/2025 9380 Gross per 24 hour  Intake 709.55 ml  Output 4355 ml  Net -3645.45 ml   Filed Weights   01/09/25 2200 01/10/25 0415  Weight: (!) 170 kg (!) 170 kg    Examination:   General: Appearance:    Severely obese male in no acute distress     Lungs:     Clear to auscultation bilaterally, respirations unlabored  Heart:    Normal heart rate. Normal rhythm. No murmurs, rubs, or gallops.    MS:  B/l amputations   Neurologic:   Awake, alert, oriented x 3. No apparent focal neurological           defect.        Data Reviewed: I  have personally reviewed following labs and imaging studies  CBC: Recent Labs  Lab 01/09/25 1920 01/09/25 1930 01/09/25 2240 01/09/25 2249 01/10/25 0148 01/11/25 0247  WBC 14.0*  --  12.9*  --  11.7* 13.3*  NEUTROABS 11.8*  --   --   --   --   --   HGB 10.0* 10.5* 10.0* 9.5* 9.3* 9.8*  HCT 31.2* 31.0* 31.4* 28.0* 29.1* 29.7*  MCV 84.1  --  85.6  --  83.6 83.7  PLT 299  --  297  --  259 324   Basic Metabolic Panel: Recent Labs  Lab 01/09/25 2240 01/09/25 2249 01/10/25 0148 01/10/25 0314 01/10/25 0725 01/10/25 1007 01/10/25 1329 01/11/25 0247  NA 128*   < > 133*  --  134* 135 134* 136  K 6.6*   < > 5.5* 5.1 5.0 4.8 5.2* 5.3*  CL 95*  --  98  --  100 100 100 99  CO2 14*  --  21*  --  23 23 22 23   GLUCOSE 391*  --  302*  --  228* 218* 197* 185*  BUN 91*  --  88*  --  82* 83* 80* 81*   CREATININE 4.62*  --  4.42*  --  4.09* 4.09* 3.96* 3.86*  CALCIUM  8.7*  --  8.6*  --  8.6* 8.5* 8.7* 8.6*  MG 1.8  --   --   --   --   --   --  1.8  PHOS 4.2  --   --   --   --   --   --   --    < > = values in this interval not displayed.   GFR: Estimated Creatinine Clearance: 31.8 mL/min (A) (by C-G formula based on SCr of 3.86 mg/dL (H)). Liver Function Tests: Recent Labs  Lab 01/09/25 1920  AST 21  ALT 23  ALKPHOS 134*  BILITOT 0.7  PROT 7.4  ALBUMIN  3.0*   Recent Labs  Lab 01/09/25 2240  LIPASE 19   Recent Labs  Lab 01/09/25 2240  AMMONIA 17   Coagulation Profile: Recent Labs  Lab 01/09/25 1920  INR 1.4*   Cardiac Enzymes: No results for input(s): CKTOTAL, CKMB, CKMBINDEX, TROPONINI in the last 168 hours. BNP (last 3 results) Recent Labs    04/27/24 1051 12/17/24 1056 01/10/25 1007  PROBNP 895* 285* 576.0*   HbA1C: Recent Labs    01/09/25 1920  HGBA1C 7.9*   CBG: Recent Labs  Lab 01/11/25 0242 01/11/25 0351 01/11/25 0450 01/11/25 0609 01/11/25 0814  GLUCAP 174* 158* 153* 151* 149*   Lipid Profile: Recent Labs    01/09/25 1920  CHOL 110  HDL 18*  LDLCALC 51  TRIG 794*  CHOLHDL 6.1   Thyroid Function Tests: No results for input(s): TSH, T4TOTAL, FREET4, T3FREE, THYROIDAB in the last 72 hours. Anemia Panel: No results for input(s): VITAMINB12, FOLATE, FERRITIN, TIBC, IRON, RETICCTPCT in the last 72 hours. Sepsis Labs: Recent Labs  Lab 01/09/25 1930 01/09/25 2240  LATICACIDVEN 0.8 1.9    Recent Results (from the past 240 hours)  MRSA Next Gen by PCR, Nasal     Status: Abnormal   Collection Time: 01/09/25  8:24 PM   Specimen: Nasal Mucosa; Nasal Swab  Result Value Ref Range Status   MRSA by PCR Next Gen DETECTED (A) NOT DETECTED Final    Comment: RESULT CALLED TO, READ BACK BY AND VERIFIED WITH: J CRUZ REYES RN 01/09/2025 @ 2250 BY AB (  NOTE) The GeneXpert MRSA Assay (FDA approved for NASAL  specimens only), is one component of a comprehensive MRSA colonization surveillance program. It is not intended to diagnose MRSA infection nor to guide or monitor treatment for MRSA infections. Test performance is not FDA approved in patients less than 77 years old. Performed at [facility], [address], [place], [name] [address]   Culture, blood (Routine X 2) w Reflex to ID Panel     Status: None (Preliminary result)   Collection Time: 01/09/25  8:43 PM   Specimen: BLOOD  Result Value Ref Range Status   Specimen Description BLOOD SITE NOT SPECIFIED  Final   Special Requests   Final    BOTTLES DRAWN AEROBIC AND ANAEROBIC Blood Culture results may not be optimal due to an inadequate volume of blood received in culture bottles   Culture   Final    NO GROWTH < 12 HOURS Performed at [facility], [address] [place], [name] [address]    Report Status PENDING  Incomplete  Resp panel by RT-PCR (RSV, Flu A&B, Covid) Anterior Nasal Swab     Status: None   Collection Time: 01/09/25  8:46 PM   Specimen: Anterior Nasal Swab  Result Value Ref Range Status   SARS Coronavirus 2 by RT PCR NEGATIVE NEGATIVE Final   Influenza A by PCR NEGATIVE NEGATIVE Final   Influenza B by PCR NEGATIVE NEGATIVE Final    Comment: (NOTE) The Xpert Xpress SARS-CoV-2/FLU/RSV plus assay is intended as an aid in the diagnosis of influenza from Nasopharyngeal swab specimens and should not be used as a sole basis for treatment. Nasal washings and aspirates are unacceptable for Xpert Xpress SARS-CoV-2/FLU/RSV testing.  Fact Sheet for Patients: bloggercourse.com  Fact Sheet for Healthcare Providers: seriousbroker.it  This test is not yet approved or cleared by the [place]  FDA and has been authorized for detection and/or diagnosis of SARS-CoV-2 by FDA under an Emergency Use Authorization (EUA). This EUA will remain in effect (meaning this  test can be used) for the duration of the COVID-19 declaration under Section 564(b)(1) of the Act, 21 U.S.C. section 360bbb-3(b)(1), unless the authorization is terminated or revoked.     Resp Syncytial Virus by PCR NEGATIVE NEGATIVE Final    Comment: (NOTE) Fact Sheet for Patients: bloggercourse.com  Fact Sheet for Healthcare Providers: seriousbroker.it  This test is not yet approved or cleared by the [place]  FDA and has been authorized for detection and/or diagnosis of SARS-CoV-2 by FDA under an Emergency Use Authorization (EUA). This EUA will remain in effect (meaning this test can be used) for the duration of the COVID-19 declaration under Section 564(b)(1) of the Act, 21 U.S.C. section 360bbb-3(b)(1), unless the authorization is terminated or revoked.  Performed at [facility], [address], [place], [name] [address]   Respiratory (~20 pathogens) panel by PCR     Status: None   Collection Time: 01/09/25  8:46 PM   Specimen: Nasopharyngeal Swab; Respiratory  Result Value Ref Range Status   Adenovirus NOT DETECTED NOT DETECTED Final   Coronavirus 229E NOT DETECTED NOT DETECTED Final    Comment: (NOTE) The Coronavirus on the Respiratory Panel, DOES NOT test for the novel  Coronavirus (2019 nCoV)    Coronavirus HKU1 NOT DETECTED NOT DETECTED Final   Coronavirus NL63 NOT DETECTED NOT DETECTED Final   Coronavirus OC43 NOT DETECTED NOT DETECTED Final   Metapneumovirus NOT DETECTED NOT DETECTED Final   Rhinovirus /  Enterovirus NOT DETECTED NOT DETECTED Final   Influenza A NOT DETECTED NOT DETECTED Final   Influenza B NOT DETECTED NOT DETECTED Final   Parainfluenza Virus 1 NOT DETECTED NOT DETECTED Final   Parainfluenza Virus 2 NOT DETECTED NOT DETECTED Final   Parainfluenza Virus 3 NOT DETECTED NOT DETECTED Final   Parainfluenza Virus 4 NOT DETECTED NOT DETECTED Final   Respiratory Syncytial Virus NOT DETECTED  NOT DETECTED Final   Bordetella pertussis NOT DETECTED NOT DETECTED Final   Bordetella Parapertussis NOT DETECTED NOT DETECTED Final   Chlamydophila pneumoniae NOT DETECTED NOT DETECTED Final   Mycoplasma pneumoniae NOT DETECTED NOT DETECTED Final    Comment: Performed at [facility], [address], [place], [name] [address]  Culture, blood (Routine X 2) w Reflex to ID Panel     Status: None (Preliminary result)   Collection Time: 01/09/25 10:40 PM   Specimen: BLOOD  Result Value Ref Range Status   Specimen Description BLOOD SITE NOT SPECIFIED  Final   Special Requests   Final    BOTTLES DRAWN AEROBIC ONLY Blood Culture results may not be optimal due to an inadequate volume of blood received in culture bottles   Culture   Final    NO GROWTH < 12 HOURS Performed at [facility], [address], [place], [name] [address]    Report Status PENDING  Incomplete         Radiology Studies: ECHOCARDIOGRAM COMPLETE Result Date: 01/10/2025    ECHOCARDIOGRAM REPORT   Patient Name:   CHAE OOMMEN [facility] Date of Exam: 01/10/2025 Medical Rec #:  [id number]       Height:       61.0 in Accession #:    [id number]      Weight:       374.8 lb Date of Birth:  1971-03-06       BSA:          2.467 m Patient Age:    53 years        BP:           100/76 mmHg Patient Gender: M               HR:           91 bpm. Exam Location:  Inpatient Procedure: 2D Echo, Intracardiac Opacification Agent, Cardiac Doppler and Color            Doppler (Both Spectral and Color Flow Doppler were utilized during            procedure). Indications:    CAD Native Vessel I25.10  History:        Patient has prior history of Echocardiogram examinations, most                 recent 03/28/2024. CAD, COPD and CKD, Signs/Symptoms:Dyspnea; Risk                 Factors:Hypertension, Diabetes, Current Smoker, Sleep Apnea and                 Obesity.  Sonographer:    Koleen Popper RDCS Referring Phys: 41 CHRISTOPHER D [facility]   Sonographer Comments: Patient is obese, Technically challenging study due to limited acoustic windows and Technically difficult study due to poor echo windows. Image acquisition challenging due to patient body habitus. Patient was in severe pain, I could  not move him at all. Echocardiogram perfromed with the patient supine and sitting upright. TDS IMPRESSIONS  1. Technically difficult study - see sonographer comments.  2. Left ventricular ejection fraction, by estimation, is 55 to 60%. The left ventricle has normal function. The left ventricle has no regional wall motion abnormalities. Left ventricular diastolic function could not be evaluated.  3. Right ventricular systolic function is normal. The right ventricular size is not well visualized.  4. The mitral valve was not well visualized. No evidence of mitral valve regurgitation. No evidence of mitral stenosis.  5. The aortic valve was not well visualized. Aortic valve regurgitation is not visualized. No aortic stenosis is present.  6. Ascending aorta measurements are within normal limits for age when indexed to body surface area. Comparison(s): A prior study was performed on 03/28/2024. LVEF 55-60%, normal diastolic function, RV function normal, Functional bicuspid AV (fusion of LCC and RCC) no stenosis or regurgitation, ascending aorta 41mm. FINDINGS  Left Ventricle: Left ventricular ejection fraction, by estimation, is 55 to 60%. The left ventricle has normal function. The left ventricle has no regional wall motion abnormalities. Definity  contrast agent was given IV to delineate the left ventricular  endocardial borders. The left ventricular internal cavity size was normal in size. There is no left ventricular hypertrophy. Left ventricular diastolic function could not be evaluated due to nondiagnostic images. Left ventricular diastolic function could not be evaluated. Right Ventricle: The right ventricular size is not well visualized. Right vetricular wall  thickness was not assessed. Right ventricular systolic function is normal. Left Atrium: Left atrial size was not well visualized. Right Atrium: Right atrial size was not well visualized. Pericardium: There is no evidence of pericardial effusion. Mitral Valve: The mitral valve was not well visualized. No evidence of mitral valve regurgitation. No evidence of mitral valve stenosis. Tricuspid Valve: The tricuspid valve is not well visualized. Tricuspid valve regurgitation is not demonstrated. No evidence of tricuspid stenosis. Aortic Valve: The aortic valve was not well visualized. Aortic valve regurgitation is not visualized. No aortic stenosis is present. Pulmonic Valve: The pulmonic valve was not well visualized. Pulmonic valve regurgitation is not visualized. Aorta: Ascending aorta measurements are within normal limits for age when indexed to body surface area. IAS/Shunts: The interatrial septum was not well visualized.  LEFT VENTRICLE PLAX 2D LVIDd:         4.80 cm      Diastology LVIDs:         3.60 cm      LV e' medial:    5.50 cm/s LV PW:         1.10 cm      LV E/e' medial:  14.5 LV IVS:        1.20 cm      LV e' lateral:   7.62 cm/s LVOT diam:     2.20 cm      LV E/e' lateral: 10.5 LV SV:         73 LV SV Index:   29 LVOT Area:     3.80 cm  LV Volumes (MOD) LV vol d, MOD A4C: 193.0 ml LV vol s, MOD A4C: 67.6 ml LV SV MOD A4C:     193.0 ml RIGHT VENTRICLE             IVC RV S prime:     11.60 cm/s  IVC diam: 2.20 cm TAPSE (M-mode): 1.6 cm LEFT ATRIUM         Index LA diam:    4.30 cm 1.74 cm/m  AORTIC VALVE LVOT Vmax:   119.00 cm/s LVOT  Vmean:  76.500 cm/s LVOT VTI:    0.191 m  AORTA Ao Root diam: 3.90 cm Ao Asc diam:  4.00 cm MITRAL VALVE MV Area (PHT): 4.49 cm    SHUNTS MV Decel Time: 169 msec    Systemic VTI:  0.19 m MV E velocity: 79.70 cm/s  Systemic Diam: 2.20 cm MV A velocity: 54.00 cm/s MV E/A ratio:  1.48 Sunit Tolia Electronically signed by Madonna Large Signature Date/Time: 01/10/2025/4:36:30 PM     Final    DG CHEST PORT 1 VIEW Result Date: 01/09/2025 EXAM: 1 VIEW(S) XRAY OF THE CHEST 01/09/2025 09:12:00 PM COMPARISON: 12/18/2024. CLINICAL HISTORY: Chest pain. Prior CABG. FINDINGS: LUNGS AND PLEURA: Low lung volumes with vascular congestion and bibasilar atelectasis. No pleural effusion. No pneumothorax. HEART AND MEDIASTINUM: No acute abnormality of the cardiac and mediastinal silhouettes. BONES AND SOFT TISSUES: No acute osseous abnormality. IMPRESSION: 1. Low lung volumes with vascular congestion and bibasilar atelectasis. Electronically signed by: Franky Crease MD 01/09/2025 09:18 PM EST RP Workstation: [id number]   CARDIAC CATHETERIZATION Result Date: 01/09/2025   Mid LM to Ost LAD lesion is 80% stenosed with 40% stenosed side branch in Ost Cx to Prox Cx.   Mid LAD lesion is 50% stenosed.   Mid Cx-2 lesion is 5% stenosed.   Dist Cx lesion is 70% stenosed.   Mid Cx-1 lesion is 100% stenosed.   Prox RCA to Mid RCA lesion is 30% stenosed.   Dist RCA lesion is 50% stenosed with 50% stenosed side branch in RPDA.   Prox RCA lesion is 80% stenosed.   Prox Graft to Mid Graft lesion is 30% stenosed.   1st Diag lesion is 40% stenosed.   3rd Diag lesion is 60% stenosed.   SVG graft was visualized by angiography and is large.   SVG graft was visualized by angiography and is normal in caliber.   SVG graft was visualized by angiography and is large.   LIMA graft was visualized by angiography and is normal in caliber. Severe distal left main disease Occlusion of the proximal to mid LAD. Patent LIMA to mid LAD. Patent SVG to Diagonal. Occlusion of the mid Circumflex. Patent SVG to obtuse marginal branch Diffuse disease in the stented segment of the mid RCA. Patent SVG to RCA LVEDP=8 mmHg Recommendations: Stable CAD without focal lesion to explain his chest pain. Admit to ICU. He will need an echo tomorrow. PCCM team to admit.        Scheduled Meds:  aspirin  EC  81 mg Oral q AM   atorvastatin   80 mg Oral  Daily   buPROPion   300 mg Oral q morning   Chlorhexidine  Gluconate Cloth  6 each Topical Daily   clopidogrel   75 mg Oral Daily   escitalopram   10 mg Oral Daily   heparin   5,000 Units Subcutaneous Q8H   sodium chloride  flush  3 mL Intravenous Q12H   sodium zirconium cyclosilicate   10 g Oral Once   vancomycin  variable dose per unstable renal function (pharmacist dosing)   Does not apply See admin instructions   Continuous Infusions:  insulin  3.4 Units/hr (01/11/25 0700)   piperacillin -tazobactam (ZOSYN )  IV 12.5 mL/hr at 01/11/25 0700     LOS: 2 days    Time spent: 35 minutes spent on chart review, discussion with nursing staff, consultants, updating family and interview/physical exam; more than 50% of that time was spent in counseling and/or coordination of care.    Soleil Mas U Mouhamadou Gittleman, DO [facility] Available via  Epic secure chat 7am-7pm After these hours, please refer to coverage provider listed on [email or web address] 01/11/2025, 8:19 AM   "

## 2025-01-11 NOTE — Progress Notes (Signed)
 PHARMACY - PHYSICIAN COMMUNICATION CRITICAL VALUE ALERT - BLOOD CULTURE IDENTIFICATION (BCID)  Joseph [name] is an 54 y.o. male who presented to [facility] on 01/09/2025 with a chief complaint of chest pain, found to have encephalopathy, and right leg/stump cellulitis  Assessment:  1 of 3 GPR, likely contaminant  Name of physician (or Provider) Contacted: [place]  Current antibiotics: Vancomycin  and Zosyn   Changes to prescribed antibiotics recommended:  Patient is on recommended antibiotics - No changes needed, likely contaminant in the BCID, and if not, is covered by current antibiotics.    [name] 01/11/2025  8:57 PM

## 2025-01-11 NOTE — Progress Notes (Signed)
 Patient ID: Joseph [name], male   DOB: [birth year], 54 y.o.   MRN: [id number] [facility] Progress Note   Assessment/ Plan:   1. Acute kidney Injury on chronic kidney disease stage IIIb (baseline creatinine 2.2-2.5): Likely multifactorial acute kidney injury in the setting of urine retention/postrenal mechanism, hemodynamic renal injury from ongoing medications and recent contrast exposure.  Diuresing well with ongoing furosemide  and with slight improvement of renal function seen overnight.  Will continue to monitor for renal recovery/management of electrolyte imbalances. 2.  Hyperkalemia: Secondary to acute kidney injury, improving and will retreat with Lokelma  today (anticipate ongoing diuresis to help further lower potassium). 3.  Coronary artery disease status post four-vessel CABG: Presentation with chest pain and underwent coronary angiography with high risk of suspicion for ACS.  Patent grafts noted. 4.  Hyperglycemia: On insulin  drip along with dextrose  per management protocol.  Subjective:   Reports to be feeling fair, intermittent confusion overnight   Objective:   BP 99/67   Pulse 89   Temp 100.2 F (37.9 C)   Resp 17   Ht 5' 1 (1.549 m)   Wt (!) 170 kg   SpO2 92%   BMI 70.81 kg/m   Intake/Output Summary (Last 24 hours) at 01/11/2025 0811 Last data filed at 01/11/2025 9380 Gross per 24 hour  Intake 709.55 ml  Output 4355 ml  Net -3645.45 ml   Weight change:   Physical Exam: Gen: Appears comfortable resting in bed, on oxygen via nasal cannula.  Nurse at bedside CVS: Pulse regular rhythm, normal rate, S1 and S2 normal Resp: Diminished breath sounds over bases, no distinct rales or rhonchi Abd: Soft, obese, nontender, bowel sounds normal Ext: Status post bilateral BKA with pitting edema over stumps  Imaging: ECHOCARDIOGRAM COMPLETE Result Date: 01/10/2025    ECHOCARDIOGRAM REPORT   Patient Name:   Joseph [name] [facility] Date of Exam: 01/10/2025 Medical Rec #:   [id number]       Height:       61.0 in Accession #:    [id number]      Weight:       374.8 lb Date of Birth:  May 28, 1971       BSA:          2.467 m Patient Age:    53 years        BP:           100/76 mmHg Patient Gender: M               HR:           91 bpm. Exam Location:  Inpatient Procedure: 2D Echo, Intracardiac Opacification Agent, Cardiac Doppler and Color            Doppler (Both Spectral and Color Flow Doppler were utilized during            procedure). Indications:    CAD Native Vessel I25.10  History:        Patient has prior history of Echocardiogram examinations, most                 recent 03/28/2024. CAD, COPD and CKD, Signs/Symptoms:Dyspnea; Risk                 Factors:Hypertension, Diabetes, Current Smoker, Sleep Apnea and                 Obesity.  Sonographer:    Koleen Popper RDCS Referring Phys: [id number] LONNI JONETTA CASH  Sonographer Comments: Patient  is obese, Technically challenging study due to limited acoustic windows and Technically difficult study due to poor echo windows. Image acquisition challenging due to patient body habitus. Patient was in severe pain, I could  not move him at all. Echocardiogram perfromed with the patient supine and sitting upright. TDS IMPRESSIONS  1. Technically difficult study - see sonographer comments.  2. Left ventricular ejection fraction, by estimation, is 55 to 60%. The left ventricle has normal function. The left ventricle has no regional wall motion abnormalities. Left ventricular diastolic function could not be evaluated.  3. Right ventricular systolic function is normal. The right ventricular size is not well visualized.  4. The mitral valve was not well visualized. No evidence of mitral valve regurgitation. No evidence of mitral stenosis.  5. The aortic valve was not well visualized. Aortic valve regurgitation is not visualized. No aortic stenosis is present.  6. Ascending aorta measurements are within normal limits for age when indexed to body  surface area. Comparison(s): A prior study was performed on 03/28/2024. LVEF 55-60%, normal diastolic function, RV function normal, Functional bicuspid AV (fusion of LCC and RCC) no stenosis or regurgitation, ascending aorta 41mm. FINDINGS  Left Ventricle: Left ventricular ejection fraction, by estimation, is 55 to 60%. The left ventricle has normal function. The left ventricle has no regional wall motion abnormalities. Definity  contrast agent was given IV to delineate the left ventricular  endocardial borders. The left ventricular internal cavity size was normal in size. There is no left ventricular hypertrophy. Left ventricular diastolic function could not be evaluated due to nondiagnostic images. Left ventricular diastolic function could not be evaluated. Right Ventricle: The right ventricular size is not well visualized. Right vetricular wall thickness was not assessed. Right ventricular systolic function is normal. Left Atrium: Left atrial size was not well visualized. Right Atrium: Right atrial size was not well visualized. Pericardium: There is no evidence of pericardial effusion. Mitral Valve: The mitral valve was not well visualized. No evidence of mitral valve regurgitation. No evidence of mitral valve stenosis. Tricuspid Valve: The tricuspid valve is not well visualized. Tricuspid valve regurgitation is not demonstrated. No evidence of tricuspid stenosis. Aortic Valve: The aortic valve was not well visualized. Aortic valve regurgitation is not visualized. No aortic stenosis is present. Pulmonic Valve: The pulmonic valve was not well visualized. Pulmonic valve regurgitation is not visualized. Aorta: Ascending aorta measurements are within normal limits for age when indexed to body surface area. IAS/Shunts: The interatrial septum was not well visualized.  LEFT VENTRICLE PLAX 2D LVIDd:         4.80 cm      Diastology LVIDs:         3.60 cm      LV e' medial:    5.50 cm/s LV PW:         1.10 cm      LV E/e'  medial:  14.5 LV IVS:        1.20 cm      LV e' lateral:   7.62 cm/s LVOT diam:     2.20 cm      LV E/e' lateral: 10.5 LV SV:         73 LV SV Index:   29 LVOT Area:     3.80 cm  LV Volumes (MOD) LV vol d, MOD A4C: 193.0 ml LV vol s, MOD A4C: 67.6 ml LV SV MOD A4C:     193.0 ml RIGHT VENTRICLE  IVC RV S prime:     11.60 cm/s  IVC diam: 2.20 cm TAPSE (M-mode): 1.6 cm LEFT ATRIUM         Index LA diam:    4.30 cm 1.74 cm/m  AORTIC VALVE LVOT Vmax:   119.00 cm/s LVOT Vmean:  76.500 cm/s LVOT VTI:    0.191 m  AORTA Ao Root diam: 3.90 cm Ao Asc diam:  4.00 cm MITRAL VALVE MV Area (PHT): 4.49 cm    SHUNTS MV Decel Time: 169 msec    Systemic VTI:  0.19 m MV E velocity: 79.70 cm/s  Systemic Diam: 2.20 cm MV A velocity: 54.00 cm/s MV E/A ratio:  1.48 Sunit Tolia Electronically signed by Madonna Large Signature Date/Time: 01/10/2025/4:36:30 PM    Final    DG CHEST PORT 1 VIEW Result Date: 01/09/2025 EXAM: 1 VIEW(S) XRAY OF THE CHEST 01/09/2025 09:12:00 PM COMPARISON: 12/18/2024. CLINICAL HISTORY: Chest pain. Prior CABG. FINDINGS: LUNGS AND PLEURA: Low lung volumes with vascular congestion and bibasilar atelectasis. No pleural effusion. No pneumothorax. HEART AND MEDIASTINUM: No acute abnormality of the cardiac and mediastinal silhouettes. BONES AND SOFT TISSUES: No acute osseous abnormality. IMPRESSION: 1. Low lung volumes with vascular congestion and bibasilar atelectasis. Electronically signed by: Franky Crease MD 01/09/2025 09:18 PM EST RP Workstation: [id number]   CARDIAC CATHETERIZATION Result Date: 01/09/2025   Mid LM to Ost LAD lesion is 80% stenosed with 40% stenosed side branch in Ost Cx to Prox Cx.   Mid LAD lesion is 50% stenosed.   Mid Cx-2 lesion is 5% stenosed.   Dist Cx lesion is 70% stenosed.   Mid Cx-1 lesion is 100% stenosed.   Prox RCA to Mid RCA lesion is 30% stenosed.   Dist RCA lesion is 50% stenosed with 50% stenosed side branch in RPDA.   Prox RCA lesion is 80% stenosed.   Prox Graft to  Mid Graft lesion is 30% stenosed.   1st Diag lesion is 40% stenosed.   3rd Diag lesion is 60% stenosed.   SVG graft was visualized by angiography and is large.   SVG graft was visualized by angiography and is normal in caliber.   SVG graft was visualized by angiography and is large.   LIMA graft was visualized by angiography and is normal in caliber. Severe distal left main disease Occlusion of the proximal to mid LAD. Patent LIMA to mid LAD. Patent SVG to Diagonal. Occlusion of the mid Circumflex. Patent SVG to obtuse marginal branch Diffuse disease in the stented segment of the mid RCA. Patent SVG to RCA LVEDP=8 mmHg Recommendations: Stable CAD without focal lesion to explain his chest pain. Admit to ICU. He will need an echo tomorrow. PCCM team to admit.    Labs: BMET Recent Labs  Lab 01/09/25 1920 01/09/25 1930 01/09/25 2240 01/09/25 2249 01/10/25 0148 01/10/25 0314 01/10/25 0725 01/10/25 1007 01/10/25 1329 01/11/25 0247  NA 127* 129* 128* 131* 133*  --  134* 135 134* 136  K 6.7* 6.7* 6.6* 5.9* 5.5* 5.1 5.0 4.8 5.2* 5.3*  CL 95* 99 95*  --  98  --  100 100 100 99  CO2 17*  --  14*  --  21*  --  23 23 22 23   GLUCOSE 452* 459* 391*  --  302*  --  228* 218* 197* 185*  BUN 92* 105* 91*  --  88*  --  82* 83* 80* 81*  CREATININE 4.74* 5.30* 4.62*  --  4.42*  --  4.09*  4.09* 3.96* 3.86*  CALCIUM  8.6*  --  8.7*  --  8.6*  --  8.6* 8.5* 8.7* 8.6*  PHOS  --   --  4.2  --   --   --   --   --   --   --    CBC Recent Labs  Lab 01/09/25 1920 01/09/25 1930 01/09/25 2240 01/09/25 2249 01/10/25 0148 01/11/25 0247  WBC 14.0*  --  12.9*  --  11.7* 13.3*  NEUTROABS 11.8*  --   --   --   --   --   HGB 10.0*   < > 10.0* 9.5* 9.3* 9.8*  HCT 31.2*   < > 31.4* 28.0* 29.1* 29.7*  MCV 84.1  --  85.6  --  83.6 83.7  PLT 299  --  297  --  259 324   < > = values in this interval not displayed.    Medications:     aspirin  EC  81 mg Oral q AM   atorvastatin   80 mg Oral Daily   buPROPion   300 mg  Oral q morning   Chlorhexidine  Gluconate Cloth  6 each Topical Daily   clopidogrel   75 mg Oral Daily   escitalopram   10 mg Oral Daily   heparin   5,000 Units Subcutaneous Q8H   sodium chloride  flush  3 mL Intravenous Q12H   vancomycin  variable dose per unstable renal function (pharmacist dosing)   Does not apply See admin instructions    Gordy Blanch, MD 01/11/2025, 8:11 AM

## 2025-01-11 NOTE — Progress Notes (Signed)
" °  Heart failure team will sign off as of 01/11/25  Stable from cardiac perspective.   Follow up as an outpatient in the HF clinic ?   No  If no please follow up with [facility]  after discharge.  Eldora Napp NP-C  7:21 AM    "

## 2025-01-12 ENCOUNTER — Encounter (HOSPITAL_COMMUNITY): Payer: Self-pay | Admitting: Pulmonary Disease

## 2025-01-12 ENCOUNTER — Inpatient Hospital Stay (HOSPITAL_COMMUNITY)

## 2025-01-12 ENCOUNTER — Other Ambulatory Visit: Payer: Self-pay

## 2025-01-12 DIAGNOSIS — L0390 Cellulitis, unspecified: Secondary | ICD-10-CM | POA: Diagnosis not present

## 2025-01-12 LAB — GLUCOSE, CAPILLARY
Glucose-Capillary: 288 mg/dL — ABNORMAL HIGH (ref 70–99)
Glucose-Capillary: 281 mg/dL — ABNORMAL HIGH (ref 70–99)
Glucose-Capillary: 255 mg/dL — ABNORMAL HIGH (ref 70–99)
Glucose-Capillary: 273 mg/dL — ABNORMAL HIGH (ref 70–99)

## 2025-01-12 LAB — CBC
HCT: 27.7 % — ABNORMAL LOW (ref 39.0–52.0)
Hemoglobin: 8.9 g/dL — ABNORMAL LOW (ref 13.0–17.0)
MCH: 26.7 pg (ref 26.0–34.0)
MCHC: 32.1 g/dL (ref 30.0–36.0)
MCV: 83.2 fL (ref 80.0–100.0)
Platelets: 400 10*3/uL (ref 150–400)
RBC: 3.33 MIL/uL — ABNORMAL LOW (ref 4.22–5.81)
RDW: 15.2 % (ref 11.5–15.5)
WBC: 12.2 10*3/uL — ABNORMAL HIGH (ref 4.0–10.5)
nRBC: 0 % (ref 0.0–0.2)

## 2025-01-12 LAB — BASIC METABOLIC PANEL WITH GFR
Anion gap: 16 — ABNORMAL HIGH (ref 5–15)
BUN: 79 mg/dL — ABNORMAL HIGH (ref 6–20)
CO2: 22 mmol/L (ref 22–32)
Calcium: 8.8 mg/dL — ABNORMAL LOW (ref 8.9–10.3)
Chloride: 98 mmol/L (ref 98–111)
Creatinine, Ser: 3.67 mg/dL — ABNORMAL HIGH (ref 0.61–1.24)
GFR, Estimated: 19 mL/min — ABNORMAL LOW
Glucose, Bld: 258 mg/dL — ABNORMAL HIGH (ref 70–99)
Potassium: 4.9 mmol/L (ref 3.5–5.1)
Sodium: 137 mmol/L (ref 135–145)

## 2025-01-12 LAB — VANCOMYCIN, RANDOM: Vancomycin Rm: 23 ug/mL

## 2025-01-12 MED ORDER — SODIUM CHLORIDE 0.9 % IV SOLN
3.0000 g | Freq: Three times a day (TID) | INTRAVENOUS | Status: DC
  Administered 2025-01-12 – 2025-01-13 (×3): 3 g via INTRAVENOUS
  Filled 2025-01-12 (×5): qty 8

## 2025-01-12 MED ORDER — ASPIRIN 81 MG PO TBEC
81.0000 mg | DELAYED_RELEASE_TABLET | Freq: Every morning | ORAL | Status: AC
  Administered 2025-01-13 – 2025-01-25 (×12): 81 mg via ORAL
  Filled 2025-01-12 (×13): qty 1

## 2025-01-12 MED ORDER — LINEZOLID 600 MG PO TABS
600.0000 mg | ORAL_TABLET | Freq: Two times a day (BID) | ORAL | Status: AC
  Administered 2025-01-13 – 2025-01-16 (×8): 600 mg via ORAL
  Filled 2025-01-12 (×8): qty 1

## 2025-01-12 MED ORDER — LINEZOLID 600 MG PO TABS
600.0000 mg | ORAL_TABLET | Freq: Two times a day (BID) | ORAL | Status: DC
  Administered 2025-01-12: 600 mg via ORAL
  Filled 2025-01-12: qty 1

## 2025-01-12 MED ORDER — INSULIN GLARGINE 100 UNIT/ML ~~LOC~~ SOLN
45.0000 [IU] | Freq: Every day | SUBCUTANEOUS | Status: DC
  Administered 2025-01-12 – 2025-01-13 (×2): 45 [IU] via SUBCUTANEOUS
  Filled 2025-01-12 (×3): qty 0.45

## 2025-01-12 MED ORDER — OXYCODONE HCL 5 MG PO TABS
10.0000 mg | ORAL_TABLET | ORAL | Status: DC | PRN
  Administered 2025-01-12 – 2025-01-19 (×32): 15 mg via ORAL
  Filled 2025-01-12 (×33): qty 3

## 2025-01-12 MED ORDER — DICLOFENAC SODIUM 1 % EX GEL
2.0000 g | Freq: Four times a day (QID) | CUTANEOUS | Status: DC
  Administered 2025-01-12 (×3): 2 g via TOPICAL
  Filled 2025-01-12: qty 100

## 2025-01-12 NOTE — TOC Progression Note (Signed)
 Transition of Care [facility]) - Progression Note    Patient Details  Name: Joseph [name] MRN: [id number] Date of Birth: [birth year]  [facility]) CM/SW Contact  Nyaja [place] A Neetu Carrozza, LCSW Phone Number: 01/12/2025, 4:57 PM  Clinical Narrative:     CSW notified by secure chat PT recommendation for SNF.  CSW made FL2, will send out once CSW can discuss with pt and family disposition plan decision.   Possible re-weight to confirm weight of pt to determine if bariatric SNF is needed if agreeable to SNF.  Disposition plan SNF vs. Home.   CSW will continue to follow.   Expected Discharge Plan: Home/Self Care Barriers to Discharge: Continued Medical Work up               Expected Discharge Plan and Services       Living arrangements for the past 2 months: Single Family Home                                       Social Drivers of Health (SDOH) Interventions SDOH Screenings   Food Insecurity: No Food Insecurity (01/11/2025)  Housing: Low Risk (01/11/2025)  Transportation Needs: Unmet Transportation Needs (01/11/2025)  Utilities: Not At Risk (01/11/2025)  Financial Resource Strain: Medium Risk (09/09/2022)  Social Connections: Moderately Isolated (03/28/2024)  Stress: No Stress Concern Present (10/10/2023)   Received from [facility]  Tobacco Use: Medium Risk (12/31/2024)    Readmission Risk Interventions    05/03/2023   11:58 AM  Readmission Risk Prevention Plan  Transportation Screening Complete  PCP or Specialist Appt within 3-5 Days Complete  HRI or Home Care Consult Complete  Social Work Consult for Recovery Care Planning/Counseling Complete  Palliative Care Screening Not Applicable  Medication Review Oceanographer) Referral to Pharmacy

## 2025-01-12 NOTE — Plan of Care (Signed)
" °  Problem: Education: Goal: Knowledge of General Education information will improve Description: Including pain rating scale, medication(s)/side effects and non-pharmacologic comfort measures Outcome: Progressing   Problem: Clinical Measurements: Goal: Will remain free from infection Outcome: Progressing Goal: Respiratory complications will improve Outcome: Progressing Goal: Cardiovascular complication will be avoided Outcome: Progressing   Problem: Health Behavior/Discharge Planning: Goal: Ability to manage health-related needs will improve Outcome: Not Progressing   Problem: Clinical Measurements: Goal: Ability to maintain clinical measurements within normal limits will improve Outcome: Not Progressing Goal: Diagnostic test results will improve Outcome: Not Progressing   Problem: Activity: Goal: Risk for activity intolerance will decrease Outcome: Not Progressing   Problem: Nutrition: Goal: Adequate nutrition will be maintained Outcome: Not Progressing   Problem: Coping: Goal: Level of anxiety will decrease Outcome: Not Progressing   "

## 2025-01-12 NOTE — TOC CM/SW Note (Signed)
 Per Theo, PT, pt's son in concern that his mother is not able to manage his father care at home. PT is recommending SNF. Notified Kiva, SW of PT recommendations to assist with SNF. Will continue to f/u to assist with the DC plan.

## 2025-01-12 NOTE — Evaluation (Signed)
 Physical Therapy Evaluation Patient Details Name: Joseph [name] MRN: [id number] DOB: May 21, 1971 Today's Date: 01/12/2025  History of Present Illness  Pt is a 54 y.o. male who presented 01/09/25 with chest pain. EKG showed ST changes in the anterior leads so he was taken to the cath lab, cath showed no acute lesion. Admitted with acute metabolic encephalopathy, hyperglycemia, hyperkalemia, hyponatremia, AKI on CKD, SIRS. PMH: asthma, COPD, chronic bronchitis, morbid obesity, OSA, diabetes, GERD, HTN, HLD, PE, DVT on Xarelto , PAD s/p bilateral BKA, CAD s/p CABG x4, aneurysm of ascending aorta, arthritis, DM2, MI, PE   Clinical Impression  Pt presents with condition above and deficits mentioned below, see PT Problem List. PTA, he was mod I mobilizing crawling up/down the stairs and pulling himself up into his power w/c. When his residual limb skin was intact and he could use his prostheses, he would use a RW to ambulate mod I. Pt lives with his spouse and parents in a 2-level house. His bedroom is upstairs and his power w/c stays downstairs. His family is available 24/7, but his wife and parents all have their own medical issues that impact them from being able to safely physically assist the pt much. Currently, the pt is demonstrating deficits in cognition, skin integrity, ROM, strength, sensation, and activity tolerance. He is severely limited in mobility by pain with all light touch and movements. He required total assist to laterally scoot his shoulders to midline in bed and then was unable to perform any other movement due to severe pain this date. He reported numbness/tingling throughout all 4 extremities and torso today, but denied it at his face. He reports his peripheral neuropathy normally only impact sensation up to his wrists. He does endorse a fall ~1.5 weeks ago in which he hurt his neck. Notified MD and RN of this and the concerns above. At this time, based on his drastic functional decline,  need for extensive physical assistance, and lack of having that level of care available to him at d/c, he could likely benefit from short-term inpatient rehab, < 3 hours/day. Will continue to follow acutely.      If plan is discharge home, recommend the following: Two people to help with walking and/or transfers;Two people to help with bathing/dressing/bathroom;Assistance with cooking/housework;Assistance with feeding;Direct supervision/assist for medications management;Direct supervision/assist for financial management;Assist for transportation;Help with stairs or ramp for entrance;Supervision due to cognitive status   Can travel by private vehicle   No    Equipment Recommendations Wheelchair cushion (measurements PT);Hospital bed;Hoyer lift (roho cushion, air mattress overlay, bil BKA hoyer lift pad)  Recommendations for Other Services  OT consult    Functional Status Assessment Patient has had a recent decline in their functional status and demonstrates the ability to make significant improvements in function in a reasonable and predictable amount of time.     Precautions / Restrictions Precautions Precautions: Fall;Other (comment) Precaution/Restrictions Comments: hypersensitive to all touch; chronic bil BKA; wounds reported on buttocks and noted at BKA Restrictions Weight Bearing Restrictions Per Provider Order: No      Mobility  Bed Mobility Overal bed mobility: Needs Assistance             General bed mobility comments: Transitioned pt's shoulders laterally to his L to obtain midline in bed. Cued pt to grab therapist's arm to pull but pt unable to give good grip or effort, needing total assist x2 to complete. Pt screaming out in pain with this slight movement and with all touch, thus  deferred further mobility at this time.    Transfers                   General transfer comment: Pt screaming out in pain with slight movement and with all touch, thus deferred  further mobility at this time.    Ambulation/Gait               General Gait Details: Unable to ambulate at this time due to wounds limiting his ability to use his prostheses  Stairs            Wheelchair Mobility     Tilt Bed    Modified Rankin (Stroke Patients Only)       Balance                                             Pertinent Vitals/Pain Pain Assessment Pain Assessment: Faces Faces Pain Scale: Hurts worst Pain Location: trunk, legs, arms with all light touch or all movement Pain Descriptors / Indicators: Discomfort, Grimacing, Guarding, Moaning Pain Intervention(s): Limited activity within patient's tolerance, Monitored during session, Repositioned, Patient requesting pain meds-RN notified    Home Living Family/patient expects to be discharged to:: Private residence Living Arrangements: Parent;Spouse/significant other (wife, mother, and father) Available Help at Discharge: Family;Available 24 hours/day (however, both parents and wife have their own medical conditions that limit them from being able to help much physically) Type of Home: House       Alternate Level Stairs-Number of Steps: 15 Home Layout: Two level;Bed/bath upstairs;1/2 bath on main level (unsure if could convert a room downstairs for him to stay in) Home Equipment: BSC/3in1;Wheelchair - power;Wheelchair - Forensic Psychologist (2 wheels)      Prior Function Prior Level of Function : Independent/Modified Independent;History of Falls (last six months)             Mobility Comments: Has had infections at residual limbs impacting him from being able to use his prosthetic legs. When he can use them he uses a RW to ambulate. Recently, has been crawling on the ground OOB and down the stairs to then pull himself up in his power w/c that stays downstairs. Reports recent fall ADLs Comments: mod I for ADLs and iADLs, but recently has been having issues with cooking and  cleaning     Extremity/Trunk Assessment   Upper Extremity Assessment Upper Extremity Assessment: Defer to OT evaluation;RUE deficits/detail;LUE deficits/detail RUE Deficits / Details: holds elbow in flexion with shoulder adducted to side and wrist slightly flexed; painful with all attempts to move any aspect of arm; painful with all light touch, reporting numbness/tingling throughout with hx of peripheral neuropathy only impacting sensation at hands up to wrists normally RUE Sensation: decreased light touch;history of peripheral neuropathy LUE Deficits / Details: holds elbow in flexion with shoulder adducted to side and wrist slightly flexed; painful with all attempts to move any aspect of arm; painful with all light touch, reporting numbness/tingling throughout with hx of peripheral neuropathy only impacting sensation at hands up to wrists normally LUE Sensation: decreased light touch;history of peripheral neuropathy    Lower Extremity Assessment Lower Extremity Assessment: RLE deficits/detail;LLE deficits/detail RLE Deficits / Details: hx of BKA; holds knee in slight flexion at rest; achieves full knee extension AROM; able to lift leg off bed against gravity while supine but displays weakness or difficulty with knee flexion  and hip adduction > abduction AROM; reports numbness/tingling throughout; wounds noted distally, worse on R than L RLE Sensation: history of peripheral neuropathy;decreased light touch LLE Deficits / Details: hx of BKA; holds knee in slight flexion at rest; achieves full knee extension AROM; able to lift leg off bed against gravity while supine but displays weakness or difficulty with knee flexion and hip adduction > abduction AROM; reports numbness/tingling throughout; wounds noted distally, worse on R than L LLE Sensation: decreased light touch;history of peripheral neuropathy    Cervical / Trunk Assessment Cervical / Trunk Assessment: Other exceptions Cervical / Trunk  Exceptions: increased body habitus  Communication   Communication Communication: No apparent difficulties    Cognition Arousal: Alert Behavior During Therapy: Anxious   PT - Cognitive impairments: History of cognitive impairments                       PT - Cognition Comments: Son reports a hx of pt being slow to process and respond intermittently. Pt currently needs repeated cuing or questions repeated as pt loses attention often and forgets what we are talking about. Following commands: Impaired Following commands impaired: Follows one step commands inconsistently, Follows one step commands with increased time     Cueing Cueing Techniques: Verbal cues, Tactile cues, Gestural cues     General Comments General comments (skin integrity, edema, etc.): Educated pt and family on likely need for air mattress considering his skine integrity concerns noted at his residual limbs and reported at his buttocks. Pt unlikely to tolerate transfer between beds this date due to severe pain, thus will try to coordinate next therapist to order air mattress next session when pt's pain is hopefully better managed.    Exercises     Assessment/Plan    PT Assessment Patient needs continued PT services  PT Problem List Decreased strength;Decreased range of motion;Decreased activity tolerance;Decreased balance;Decreased mobility;Decreased cognition;Impaired sensation;Pain;Decreased skin integrity       PT Treatment Interventions DME instruction;Functional mobility training;Therapeutic activities;Therapeutic exercise;Balance training;Neuromuscular re-education;Cognitive remediation;Patient/family education;Wheelchair mobility training    PT Goals (Current goals can be found in the Care Plan section)  Acute Rehab PT Goals Patient Stated Goal: to reduce pain PT Goal Formulation: With patient/family Time For Goal Achievement: 01/26/25 Potential to Achieve Goals: Fair    Frequency Min 2X/week      Co-evaluation               AM-PAC PT 6 Clicks Mobility  Outcome Measure Help needed turning from your back to your side while in a flat bed without using bedrails?: Total Help needed moving from lying on your back to sitting on the side of a flat bed without using bedrails?: Total Help needed moving to and from a bed to a chair (including a wheelchair)?: Total Help needed standing up from a chair using your arms (e.g., wheelchair or bedside chair)?: Total Help needed to walk in hospital room?: Total Help needed climbing 3-5 steps with a railing? : Total 6 Click Score: 6    End of Session   Activity Tolerance: Patient limited by pain Patient left: in bed;with call bell/phone within reach;with bed alarm set;with family/visitor present Nurse Communication: Mobility status;Other (comment) (notified RN, child psychotherapist, and MD of pt's pain, numbness/tingling, report of fall and neck injury recently and concerns about getting the level of care he currently needs at d/c) PT Visit Diagnosis: Muscle weakness (generalized) (M62.81);Difficulty in walking, not elsewhere classified (R26.2);Pain;History of falling (Z91.81) Pain -  Right/Left:  (bil) Pain - part of body:  (generalized)    Time: 8651-8571 PT Time Calculation (min) (ACUTE ONLY): 40 min   Charges:   PT Evaluation $PT Eval Moderate Complexity: 1 Mod PT Treatments $Therapeutic Activity: 23-37 mins PT General Charges $$ ACUTE PT VISIT: 1 Visit         Theo Ferretti, PT, DPT Acute Rehabilitation Services  Office: [phone]   Theo CHRISTELLA Ferretti 01/12/2025, 6:45 PM

## 2025-01-12 NOTE — Progress Notes (Signed)
 Patient ID: Joseph [name], male   DOB: [birth year], 54 y.o.   MRN: [id number] [facility] Progress Note   Assessment/ Plan:   1. Acute kidney Injury on chronic kidney disease stage IIIb (baseline creatinine 2.2-2.5): Likely multifactorial acute kidney injury in the setting of urine retention/postrenal mechanism, hemodynamic renal injury from ongoing medications and recent contrast exposure.  Excellent urine output overnight with some improvement of renal function seen on labs.  Will monitor without supplemental diuresis today anticipating auto-diuresis as he recovers from acute kidney injury. 2.  Hyperkalemia: Secondary to acute kidney injury, treated successfully with medical management including diuresis and Lokelma . 3.  Coronary artery disease status post four-vessel CABG: Presentation with chest pain and underwent coronary angiography with high risk of suspicion for ACS.  Patent grafts noted. 4.  Hyperglycemia: On insulin  drip along with dextrose  per management protocol.  Subjective:   Complains of pain in his arms/shoulders after being repositioned in bed earlier.   Objective:   BP 122/84 (BP Location: Right Wrist)   Pulse 89   Temp 98.6 F (37 C) (Oral)   Resp 16   Ht 5' 1 (1.549 m)   Wt (!) 165.8 kg   SpO2 95%   BMI 69.06 kg/m   Intake/Output Summary (Last 24 hours) at 01/12/2025 0910 Last data filed at 01/12/2025 0556 Gross per 24 hour  Intake 1173.64 ml  Output 3500 ml  Net -2326.36 ml   Weight change:   Physical Exam: Gen: Appears uncomfortable sitting up in bed CVS: Pulse regular rhythm, normal rate, S1 and S2 normal Resp: Diminished breath sounds over bases, no distinct rales or rhonchi Abd: Soft, obese, nontender, bowel sounds normal Ext: Status post bilateral BKA with pitting edema over stumps  Imaging: DG Tibia/Fibula Right Port Result Date: 01/11/2025 EXAM: _VIEWS_ VIEW(S) XRAY OF THE RIGHT TIBIA AND FIBULA 01/11/2025 09:08:00 AM COMPARISON:  None available. CLINICAL HISTORY: Stump pain (HCC). ICD10: 290900 Stump pain (HCC). FINDINGS: BONES AND JOINTS: Postsurgical changes of a below knee amputation. No acute fracture or dislocation. No malalignment. Mild degenerative changes of the knee. SOFT TISSUES: A couple of surgical clips noted in the soft tissues about the knee. Subcutaneous edema throughout the visualized leg. IMPRESSION: 1. Subcutaneous edema throughout the visualized leg. No acute fracture or dislocation. 2. Mild degenerative changes of the knee. Electronically signed by: Rogelia Myers MD 01/11/2025 09:14 AM EST RP Workstation: [id number]   ECHOCARDIOGRAM COMPLETE Result Date: 01/10/2025    ECHOCARDIOGRAM REPORT   Patient Name:   Joseph [name] [facility] Date of Exam: 01/10/2025 Medical Rec #:  [id number]       Height:       61.0 in Accession #:    [id number]      Weight:       374.8 lb Date of Birth:  August 16, 1971       BSA:          2.467 m Patient Age:    53 years        BP:           100/76 mmHg Patient Gender: M               HR:           91 bpm. Exam Location:  Inpatient Procedure: 2D Echo, Intracardiac Opacification Agent, Cardiac Doppler and Color            Doppler (Both Spectral and Color Flow Doppler were utilized during  procedure). Indications:    CAD Native Vessel I25.10  History:        Patient has prior history of Echocardiogram examinations, most                 recent 03/28/2024. CAD, COPD and CKD, Signs/Symptoms:Dyspnea; Risk                 Factors:Hypertension, Diabetes, Current Smoker, Sleep Apnea and                 Obesity.  Sonographer:    Koleen Popper RDCS Referring Phys: 36 CHRISTOPHER D [facility]  Sonographer Comments: Patient is obese, Technically challenging study due to limited acoustic windows and Technically difficult study due to poor echo windows. Image acquisition challenging due to patient body habitus. Patient was in severe pain, I could  not move him at all. Echocardiogram perfromed with the patient  supine and sitting upright. TDS IMPRESSIONS  1. Technically difficult study - see sonographer comments.  2. Left ventricular ejection fraction, by estimation, is 55 to 60%. The left ventricle has normal function. The left ventricle has no regional wall motion abnormalities. Left ventricular diastolic function could not be evaluated.  3. Right ventricular systolic function is normal. The right ventricular size is not well visualized.  4. The mitral valve was not well visualized. No evidence of mitral valve regurgitation. No evidence of mitral stenosis.  5. The aortic valve was not well visualized. Aortic valve regurgitation is not visualized. No aortic stenosis is present.  6. Ascending aorta measurements are within normal limits for age when indexed to body surface area. Comparison(s): A prior study was performed on 03/28/2024. LVEF 55-60%, normal diastolic function, RV function normal, Functional bicuspid AV (fusion of LCC and RCC) no stenosis or regurgitation, ascending aorta 41mm. FINDINGS  Left Ventricle: Left ventricular ejection fraction, by estimation, is 55 to 60%. The left ventricle has normal function. The left ventricle has no regional wall motion abnormalities. Definity  contrast agent was given IV to delineate the left ventricular  endocardial borders. The left ventricular internal cavity size was normal in size. There is no left ventricular hypertrophy. Left ventricular diastolic function could not be evaluated due to nondiagnostic images. Left ventricular diastolic function could not be evaluated. Right Ventricle: The right ventricular size is not well visualized. Right vetricular wall thickness was not assessed. Right ventricular systolic function is normal. Left Atrium: Left atrial size was not well visualized. Right Atrium: Right atrial size was not well visualized. Pericardium: There is no evidence of pericardial effusion. Mitral Valve: The mitral valve was not well visualized. No evidence of  mitral valve regurgitation. No evidence of mitral valve stenosis. Tricuspid Valve: The tricuspid valve is not well visualized. Tricuspid valve regurgitation is not demonstrated. No evidence of tricuspid stenosis. Aortic Valve: The aortic valve was not well visualized. Aortic valve regurgitation is not visualized. No aortic stenosis is present. Pulmonic Valve: The pulmonic valve was not well visualized. Pulmonic valve regurgitation is not visualized. Aorta: Ascending aorta measurements are within normal limits for age when indexed to body surface area. IAS/Shunts: The interatrial septum was not well visualized.  LEFT VENTRICLE PLAX 2D LVIDd:         4.80 cm      Diastology LVIDs:         3.60 cm      LV e' medial:    5.50 cm/s LV PW:         1.10 cm  LV E/e' medial:  14.5 LV IVS:        1.20 cm      LV e' lateral:   7.62 cm/s LVOT diam:     2.20 cm      LV E/e' lateral: 10.5 LV SV:         73 LV SV Index:   29 LVOT Area:     3.80 cm  LV Volumes (MOD) LV vol d, MOD A4C: 193.0 ml LV vol s, MOD A4C: 67.6 ml LV SV MOD A4C:     193.0 ml RIGHT VENTRICLE             IVC RV S prime:     11.60 cm/s  IVC diam: 2.20 cm TAPSE (M-mode): 1.6 cm LEFT ATRIUM         Index LA diam:    4.30 cm 1.74 cm/m  AORTIC VALVE LVOT Vmax:   119.00 cm/s LVOT Vmean:  76.500 cm/s LVOT VTI:    0.191 m  AORTA Ao Root diam: 3.90 cm Ao Asc diam:  4.00 cm MITRAL VALVE MV Area (PHT): 4.49 cm    SHUNTS MV Decel Time: 169 msec    Systemic VTI:  0.19 m MV E velocity: 79.70 cm/s  Systemic Diam: 2.20 cm MV A velocity: 54.00 cm/s MV E/A ratio:  1.48 Sunit Tolia Electronically signed by [employer] Signature Date/Time: 01/10/2025/4:36:30 PM    Final     Labs: BMET Recent Labs  Lab 01/09/25 2240 01/09/25 2249 01/10/25 0148 01/10/25 0314 01/10/25 0725 01/10/25 1007 01/10/25 1329 01/11/25 0247 01/12/25 0323  NA 128* 131* 133*  --  134* 135 134* 136 137  K 6.6* 5.9* 5.5* 5.1 5.0 4.8 5.2* 5.3* 4.9  CL 95*  --  98  --  100 100 100 99 98  CO2  14*  --  21*  --  23 23 22 23 22   GLUCOSE 391*  --  302*  --  228* 218* 197* 185* 258*  BUN 91*  --  88*  --  82* 83* 80* 81* 79*  CREATININE 4.62*  --  4.42*  --  4.09* 4.09* 3.96* 3.86* 3.67*  CALCIUM  8.7*  --  8.6*  --  8.6* 8.5* 8.7* 8.6* 8.8*  PHOS 4.2  --   --   --   --   --   --   --   --    CBC Recent Labs  Lab 01/09/25 1920 01/09/25 1930 01/09/25 2240 01/09/25 2249 01/10/25 0148 01/11/25 0247 01/12/25 0323  WBC 14.0*  --  12.9*  --  11.7* 13.3* 12.2*  NEUTROABS 11.8*  --   --   --   --   --   --   HGB 10.0*   < > 10.0* 9.5* 9.3* 9.8* 8.9*  HCT 31.2*   < > 31.4* 28.0* 29.1* 29.7* 27.7*  MCV 84.1  --  85.6  --  83.6 83.7 83.2  PLT 299  --  297  --  259 324 400   < > = values in this interval not displayed.    Medications:     aspirin  EC  81 mg Oral q AM   atorvastatin   80 mg Oral Daily   buPROPion   300 mg Oral q morning   Chlorhexidine  Gluconate Cloth  6 each Topical Daily   clopidogrel   75 mg Oral Daily   feeding supplement  237 mL Oral BID BM   heparin   5,000 Units Subcutaneous Q8H  insulin  aspart  0-15 Units Subcutaneous TID WC   insulin  aspart  0-5 Units Subcutaneous QHS   insulin  glargine  40 Units Subcutaneous Daily   linezolid   600 mg Oral Q12H   mupirocin  ointment  1 Application Nasal BID   sodium chloride  flush  3 mL Intravenous Q12H    Gordy Blanch, MD 01/12/2025, 9:10 AM

## 2025-01-12 NOTE — Progress Notes (Signed)
 " PROGRESS NOTE    Joseph [name]  FMW:[id number] DOB: 10/10/1971 DOA: 01/09/2025 PCP: Alec House, MD    Brief Narrative:  54 year old male with past medical history of asthma, COPD, chronic bronchitis, morbid obesity, OSA, diabetes, GERD, HTN, HLD, PE, DVT on Xarelto , PAD s/p bilateral BKA, CAD s/p CABG x4 who initially presented to the emergency department with chest pain.    Assessment and Plan: Acute metabolic encephalopathy  Likely mixed uremia, CO2 narcosis, DKA. Also polypharmacy, on xanaflex, lyrica , oxycodone , wellbutrin , xanax   -improving with treating metabolic derangements and BIPAP -continue to monitor     Hyperglycemia/Hyperkalemia /Hyponatremia  - Was on insulin  drip but has been changed to subcu insulin -titrate up -lokelma  with resolution of hyperkalemia     AKI on CKD /AGMA  Possibly dehydration/pre-renal AKI on CKD  - UA bland, nephro consult appreciated -strict I/Os -cath 1/21--? Worsened from contrast -foley in place-- d/c once CR stable   SIRS, query infection from his right leg/stump cellulitis - obtain UA, CXR,covid/flu/rsv, 20 pathogen panel -- negative thus far - IV antibiotics - X-ray with subcu edema    - Blood cultures positive but deemed to be contaminant by pharmacy-Will continue to monitor  COPD  Chronic bronchitis Asthma  OSA on CPAP  -wean off O2 as able    CAD s/p CABG x4 Left shoulder pain  Concern this was anginal pain, cath was clear. Doubt PE as no hypoxia  Most recent echo 03/2024 with EF 55-60%,, RV normal -echo - cardiology signed off - cath clean  - asa/plavix  cont    History of PE, DVT  - not on home DOAC  -PE was remote in 2014   Depression  Anxiety   - continue wellbutrin  and lexapro     Chronica pain - Begin to resume home medications  Obesity Estimated body mass index is 69.06 kg/m as calculated from the following:   Height as of this encounter: 5' 1 (1.549 m).   Weight as of this encounter: 165.8 kg.     DVT prophylaxis: heparin  injection 5,000 Units Start: 01/09/25 2200    Code Status: Full Code   Disposition Plan:  Level of care: Progressive Status is: Inpatient     Consultants:  PCCM cards   Subjective: Redness and pain in right stump appear to be improved  Objective: Vitals:   01/11/25 2334 01/12/25 0304 01/12/25 0556 01/12/25 0807  BP:  119/83  122/84  Pulse:  89 88 89  Resp: 15 15 16 16   Temp:  98.7 F (37.1 C)  98.6 F (37 C)  TempSrc:  Oral  Oral  SpO2: 98% 95%  95%  Weight:   (!) 165.8 kg   Height:        Intake/Output Summary (Last 24 hours) at 01/12/2025 1140 Last data filed at 01/12/2025 0556 Gross per 24 hour  Intake 1147.77 ml  Output 2850 ml  Net -1702.23 ml   Filed Weights   01/09/25 2200 01/10/25 0415 01/12/25 0556  Weight: (!) 170 kg (!) 170 kg (!) 165.8 kg    Examination:   General: Appearance:    Severely obese male in no acute distress     Lungs:     Clear to auscultation bilaterally, respirations unlabored  Heart:    Normal heart rate. Normal rhythm. No murmurs, rubs, or gallops.    MS:  B/l amputations   Neurologic:   Awake, alert, weak appearing       Data Reviewed: I have personally reviewed following labs  and imaging studies  CBC: Recent Labs  Lab 01/09/25 1920 01/09/25 1930 01/09/25 2240 01/09/25 2249 01/10/25 0148 01/11/25 0247 01/12/25 0323  WBC 14.0*  --  12.9*  --  11.7* 13.3* 12.2*  NEUTROABS 11.8*  --   --   --   --   --   --   HGB 10.0*   < > 10.0* 9.5* 9.3* 9.8* 8.9*  HCT 31.2*   < > 31.4* 28.0* 29.1* 29.7* 27.7*  MCV 84.1  --  85.6  --  83.6 83.7 83.2  PLT 299  --  297  --  259 324 400   < > = values in this interval not displayed.   Basic Metabolic Panel: Recent Labs  Lab 01/09/25 2240 01/09/25 2249 01/10/25 0725 01/10/25 1007 01/10/25 1329 01/11/25 0247 01/12/25 0323  NA 128*   < > 134* 135 134* 136 137  K 6.6*   < > 5.0 4.8 5.2* 5.3* 4.9  CL 95*   < > 100 100 100 99 98  CO2 14*   <  > 23 23 22 23 22   GLUCOSE 391*   < > 228* 218* 197* 185* 258*  BUN 91*   < > 82* 83* 80* 81* 79*  CREATININE 4.62*   < > 4.09* 4.09* 3.96* 3.86* 3.67*  CALCIUM  8.7*   < > 8.6* 8.5* 8.7* 8.6* 8.8*  MG 1.8  --   --   --   --  1.8  --   PHOS 4.2  --   --   --   --   --   --    < > = values in this interval not displayed.   GFR: Estimated Creatinine Clearance: 32.9 mL/min (A) (by C-G formula based on SCr of 3.67 mg/dL (H)). Liver Function Tests: Recent Labs  Lab 01/09/25 1920  AST 21  ALT 23  ALKPHOS 134*  BILITOT 0.7  PROT 7.4  ALBUMIN  3.0*   Recent Labs  Lab 01/09/25 2240  LIPASE 19   Recent Labs  Lab 01/09/25 2240  AMMONIA 17   Coagulation Profile: Recent Labs  Lab 01/09/25 1920  INR 1.4*   Cardiac Enzymes: No results for input(s): CKTOTAL, CKMB, CKMBINDEX, TROPONINI in the last 168 hours. BNP (last 3 results) Recent Labs    04/27/24 1051 12/17/24 1056 01/10/25 1007  PROBNP 895* 285* 576.0*   HbA1C: Recent Labs    01/09/25 1920  HGBA1C 7.9*   CBG: Recent Labs  Lab 01/11/25 1336 01/11/25 1427 01/11/25 1646 01/11/25 2106 01/12/25 0558  GLUCAP 220* 204* 189* 245* 288*   Lipid Profile: Recent Labs    01/09/25 1920  CHOL 110  HDL 18*  LDLCALC 51  TRIG 794*  CHOLHDL 6.1   Thyroid Function Tests: No results for input(s): TSH, T4TOTAL, FREET4, T3FREE, THYROIDAB in the last 72 hours. Anemia Panel: No results for input(s): VITAMINB12, FOLATE, FERRITIN, TIBC, IRON, RETICCTPCT in the last 72 hours. Sepsis Labs: Recent Labs  Lab 01/09/25 1930 01/09/25 2240  LATICACIDVEN 0.8 1.9    Recent Results (from the past 240 hours)  MRSA Next Gen by PCR, Nasal     Status: Abnormal   Collection Time: 01/09/25  8:24 PM   Specimen: Nasal Mucosa; Nasal Swab  Result Value Ref Range Status   MRSA by PCR Next Gen DETECTED (A) NOT DETECTED Final    Comment: RESULT CALLED TO, READ BACK BY AND VERIFIED WITH: J CRUZ REYES RN  01/09/2025 @ 2250 BY AB (NOTE)  The GeneXpert MRSA Assay (FDA approved for NASAL specimens only), is one component of a comprehensive MRSA colonization surveillance program. It is not intended to diagnose MRSA infection nor to guide or monitor treatment for MRSA infections. Test performance is not FDA approved in patients less than 77 years old. Performed at [facility], [address], [place], [name] [address]   Culture, blood (Routine X 2) w Reflex to ID Panel     Status: None (Preliminary result)   Collection Time: 01/09/25  8:43 PM   Specimen: BLOOD  Result Value Ref Range Status   Specimen Description BLOOD SITE NOT SPECIFIED  Final   Special Requests   Final    BOTTLES DRAWN AEROBIC AND ANAEROBIC Blood Culture results may not be optimal due to an inadequate volume of blood received in culture bottles   Culture  Setup Time   Final    GRAM POSITIVE RODS AEROBIC BOTTLE ONLY CRITICAL RESULT CALLED TO, READ BACK BY AND VERIFIED WITH: PHARMD A. PAYTES [id number] @ 2033 FH Performed at [facility], [address] [place], [name] [address]    Culture GRAM POSITIVE RODS  Final   Report Status PENDING  Incomplete  Resp panel by RT-PCR (RSV, Flu A&B, Covid) Anterior Nasal Swab     Status: None   Collection Time: 01/09/25  8:46 PM   Specimen: Anterior Nasal Swab  Result Value Ref Range Status   SARS Coronavirus 2 by RT PCR NEGATIVE NEGATIVE Final   Influenza A by PCR NEGATIVE NEGATIVE Final   Influenza B by PCR NEGATIVE NEGATIVE Final    Comment: (NOTE) The Xpert Xpress SARS-CoV-2/FLU/RSV plus assay is intended as an aid in the diagnosis of influenza from Nasopharyngeal swab specimens and should not be used as a sole basis for treatment. Nasal washings and aspirates are unacceptable for Xpert Xpress SARS-CoV-2/FLU/RSV testing.  Fact Sheet for Patients: bloggercourse.com  Fact Sheet for Healthcare  Providers: seriousbroker.it  This test is not yet approved or cleared by the [place]  FDA and has been authorized for detection and/or diagnosis of SARS-CoV-2 by FDA under an Emergency Use Authorization (EUA). This EUA will remain in effect (meaning this test can be used) for the duration of the COVID-19 declaration under Section 564(b)(1) of the Act, 21 U.S.C. section 360bbb-3(b)(1), unless the authorization is terminated or revoked.     Resp Syncytial Virus by PCR NEGATIVE NEGATIVE Final    Comment: (NOTE) Fact Sheet for Patients: bloggercourse.com  Fact Sheet for Healthcare Providers: seriousbroker.it  This test is not yet approved or cleared by the [place]  FDA and has been authorized for detection and/or diagnosis of SARS-CoV-2 by FDA under an Emergency Use Authorization (EUA). This EUA will remain in effect (meaning this test can be used) for the duration of the COVID-19 declaration under Section 564(b)(1) of the Act, 21 U.S.C. section 360bbb-3(b)(1), unless the authorization is terminated or revoked.  Performed at [facility], [address], [place], [name] [address]   Respiratory (~20 pathogens) panel by PCR     Status: None   Collection Time: 01/09/25  8:46 PM   Specimen: Nasopharyngeal Swab; Respiratory  Result Value Ref Range Status   Adenovirus NOT DETECTED NOT DETECTED Final   Coronavirus 229E NOT DETECTED NOT DETECTED Final    Comment: (NOTE) The Coronavirus on the Respiratory Panel, DOES NOT test for the novel  Coronavirus (2019 nCoV)    Coronavirus HKU1 NOT DETECTED NOT DETECTED Final   Coronavirus  NL63 NOT DETECTED NOT DETECTED Final   Coronavirus OC43 NOT DETECTED NOT DETECTED Final   Metapneumovirus NOT DETECTED NOT DETECTED Final   Rhinovirus / Enterovirus NOT DETECTED NOT DETECTED Final   Influenza A NOT DETECTED NOT DETECTED Final   Influenza B NOT DETECTED  NOT DETECTED Final   Parainfluenza Virus 1 NOT DETECTED NOT DETECTED Final   Parainfluenza Virus 2 NOT DETECTED NOT DETECTED Final   Parainfluenza Virus 3 NOT DETECTED NOT DETECTED Final   Parainfluenza Virus 4 NOT DETECTED NOT DETECTED Final   Respiratory Syncytial Virus NOT DETECTED NOT DETECTED Final   Bordetella pertussis NOT DETECTED NOT DETECTED Final   Bordetella Parapertussis NOT DETECTED NOT DETECTED Final   Chlamydophila pneumoniae NOT DETECTED NOT DETECTED Final   Mycoplasma pneumoniae NOT DETECTED NOT DETECTED Final    Comment: Performed at [facility], [address], [place], [name] [address]  Culture, blood (Routine X 2) w Reflex to ID Panel     Status: None (Preliminary result)   Collection Time: 01/09/25 10:40 PM   Specimen: BLOOD  Result Value Ref Range Status   Specimen Description BLOOD SITE NOT SPECIFIED  Final   Special Requests   Final    BOTTLES DRAWN AEROBIC ONLY Blood Culture results may not be optimal due to an inadequate volume of blood received in culture bottles   Culture   Final    NO GROWTH 3 DAYS Performed at [facility], [address] [place], [name] [address]    Report Status PENDING  Incomplete         Radiology Studies: DG Tibia/Fibula Right Port Result Date: 01/11/2025 EXAM: _VIEWS_ VIEW(S) XRAY OF THE RIGHT TIBIA AND FIBULA 01/11/2025 09:08:00 AM COMPARISON: None available. CLINICAL HISTORY: Stump pain (HCC). ICD10: 290900 Stump pain (HCC). FINDINGS: BONES AND JOINTS: Postsurgical changes of a below knee amputation. No acute fracture or dislocation. No malalignment. Mild degenerative changes of the knee. SOFT TISSUES: A couple of surgical clips noted in the soft tissues about the knee. Subcutaneous edema throughout the visualized leg. IMPRESSION: 1. Subcutaneous edema throughout the visualized leg. No acute fracture or dislocation. 2. Mild degenerative changes of the knee. Electronically signed by: Rogelia Myers MD 01/11/2025  09:14 AM EST RP Workstation: [id number]   ECHOCARDIOGRAM COMPLETE Result Date: 01/10/2025    ECHOCARDIOGRAM REPORT   Patient Name:   KENTRAIL SHEW [facility] Date of Exam: 01/10/2025 Medical Rec #:  [id number]       Height:       61.0 in Accession #:    [id number]      Weight:       374.8 lb Date of Birth:  Oct 30, 1971       BSA:          2.467 m Patient Age:    53 years        BP:           100/76 mmHg Patient Gender: M               HR:           91 bpm. Exam Location:  Inpatient Procedure: 2D Echo, Intracardiac Opacification Agent, Cardiac Doppler and Color            Doppler (Both Spectral and Color Flow Doppler were utilized during            procedure). Indications:    CAD Native Vessel I25.10  History:        Patient has  prior history of Echocardiogram examinations, most                 recent 03/28/2024. CAD, COPD and CKD, Signs/Symptoms:Dyspnea; Risk                 Factors:Hypertension, Diabetes, Current Smoker, Sleep Apnea and                 Obesity.  Sonographer:    Koleen Popper RDCS Referring Phys: 32 CHRISTOPHER D [facility]  Sonographer Comments: Patient is obese, Technically challenging study due to limited acoustic windows and Technically difficult study due to poor echo windows. Image acquisition challenging due to patient body habitus. Patient was in severe pain, I could  not move him at all. Echocardiogram perfromed with the patient supine and sitting upright. TDS IMPRESSIONS  1. Technically difficult study - see sonographer comments.  2. Left ventricular ejection fraction, by estimation, is 55 to 60%. The left ventricle has normal function. The left ventricle has no regional wall motion abnormalities. Left ventricular diastolic function could not be evaluated.  3. Right ventricular systolic function is normal. The right ventricular size is not well visualized.  4. The mitral valve was not well visualized. No evidence of mitral valve regurgitation. No evidence of mitral stenosis.  5. The aortic valve was  not well visualized. Aortic valve regurgitation is not visualized. No aortic stenosis is present.  6. Ascending aorta measurements are within normal limits for age when indexed to body surface area. Comparison(s): A prior study was performed on 03/28/2024. LVEF 55-60%, normal diastolic function, RV function normal, Functional bicuspid AV (fusion of LCC and RCC) no stenosis or regurgitation, ascending aorta 41mm. FINDINGS  Left Ventricle: Left ventricular ejection fraction, by estimation, is 55 to 60%. The left ventricle has normal function. The left ventricle has no regional wall motion abnormalities. Definity  contrast agent was given IV to delineate the left ventricular  endocardial borders. The left ventricular internal cavity size was normal in size. There is no left ventricular hypertrophy. Left ventricular diastolic function could not be evaluated due to nondiagnostic images. Left ventricular diastolic function could not be evaluated. Right Ventricle: The right ventricular size is not well visualized. Right vetricular wall thickness was not assessed. Right ventricular systolic function is normal. Left Atrium: Left atrial size was not well visualized. Right Atrium: Right atrial size was not well visualized. Pericardium: There is no evidence of pericardial effusion. Mitral Valve: The mitral valve was not well visualized. No evidence of mitral valve regurgitation. No evidence of mitral valve stenosis. Tricuspid Valve: The tricuspid valve is not well visualized. Tricuspid valve regurgitation is not demonstrated. No evidence of tricuspid stenosis. Aortic Valve: The aortic valve was not well visualized. Aortic valve regurgitation is not visualized. No aortic stenosis is present. Pulmonic Valve: The pulmonic valve was not well visualized. Pulmonic valve regurgitation is not visualized. Aorta: Ascending aorta measurements are within normal limits for age when indexed to body surface area. IAS/Shunts: The interatrial  septum was not well visualized.  LEFT VENTRICLE PLAX 2D LVIDd:         4.80 cm      Diastology LVIDs:         3.60 cm      LV e' medial:    5.50 cm/s LV PW:         1.10 cm      LV E/e' medial:  14.5 LV IVS:        1.20 cm  LV e' lateral:   7.62 cm/s LVOT diam:     2.20 cm      LV E/e' lateral: 10.5 LV SV:         73 LV SV Index:   29 LVOT Area:     3.80 cm  LV Volumes (MOD) LV vol d, MOD A4C: 193.0 ml LV vol s, MOD A4C: 67.6 ml LV SV MOD A4C:     193.0 ml RIGHT VENTRICLE             IVC RV S prime:     11.60 cm/s  IVC diam: 2.20 cm TAPSE (M-mode): 1.6 cm LEFT ATRIUM         Index LA diam:    4.30 cm 1.74 cm/m  AORTIC VALVE LVOT Vmax:   119.00 cm/s LVOT Vmean:  76.500 cm/s LVOT VTI:    0.191 m  AORTA Ao Root diam: 3.90 cm Ao Asc diam:  4.00 cm MITRAL VALVE MV Area (PHT): 4.49 cm    SHUNTS MV Decel Time: 169 msec    Systemic VTI:  0.19 m MV E velocity: 79.70 cm/s  Systemic Diam: 2.20 cm MV A velocity: 54.00 cm/s MV E/A ratio:  1.48 Sunit Tolia Electronically signed by [employer] Signature Date/Time: 01/10/2025/4:36:30 PM    Final         Scheduled Meds:  aspirin  EC  81 mg Oral q AM   atorvastatin   80 mg Oral Daily   buPROPion   300 mg Oral q morning   Chlorhexidine  Gluconate Cloth  6 each Topical Daily   clopidogrel   75 mg Oral Daily   feeding supplement  237 mL Oral BID BM   heparin   5,000 Units Subcutaneous Q8H   insulin  aspart  0-15 Units Subcutaneous TID WC   insulin  aspart  0-5 Units Subcutaneous QHS   insulin  glargine  45 Units Subcutaneous Daily   linezolid   600 mg Oral Q12H   mupirocin  ointment  1 Application Nasal BID   sodium chloride  flush  3 mL Intravenous Q12H   Continuous Infusions:  ampicillin -sulbactam (UNASYN ) IV     insulin  Stopped (01/11/25 1455)     LOS: 3 days    Time spent: 35 minutes spent on chart review, discussion with nursing staff, consultants, updating family and interview/physical exam; more than 50% of that time was spent in counseling and/or  coordination of care.    Harlene RAYMOND Bowl, DO [facility] Available via Epic secure chat 7am-7pm After these hours, please refer to coverage provider listed on [email or web address] 01/12/2025, 11:40 AM   "

## 2025-01-13 ENCOUNTER — Inpatient Hospital Stay (HOSPITAL_COMMUNITY)

## 2025-01-13 DIAGNOSIS — N179 Acute kidney failure, unspecified: Secondary | ICD-10-CM | POA: Diagnosis not present

## 2025-01-13 DIAGNOSIS — R079 Chest pain, unspecified: Secondary | ICD-10-CM | POA: Diagnosis not present

## 2025-01-13 LAB — GLUCOSE, CAPILLARY
Glucose-Capillary: 224 mg/dL — ABNORMAL HIGH (ref 70–99)
Glucose-Capillary: 201 mg/dL — ABNORMAL HIGH (ref 70–99)
Glucose-Capillary: 222 mg/dL — ABNORMAL HIGH (ref 70–99)
Glucose-Capillary: 220 mg/dL — ABNORMAL HIGH (ref 70–99)
Glucose-Capillary: 200 mg/dL — ABNORMAL HIGH (ref 70–99)

## 2025-01-13 LAB — BASIC METABOLIC PANEL WITH GFR
Anion gap: 12 (ref 5–15)
BUN: 69 mg/dL — ABNORMAL HIGH (ref 6–20)
CO2: 24 mmol/L (ref 22–32)
Calcium: 8.9 mg/dL (ref 8.9–10.3)
Chloride: 100 mmol/L (ref 98–111)
Creatinine, Ser: 3.05 mg/dL — ABNORMAL HIGH (ref 0.61–1.24)
GFR, Estimated: 24 mL/min — ABNORMAL LOW
Glucose, Bld: 223 mg/dL — ABNORMAL HIGH (ref 70–99)
Potassium: 5 mmol/L (ref 3.5–5.1)
Sodium: 136 mmol/L (ref 135–145)

## 2025-01-13 LAB — CBC
HCT: 28.8 % — ABNORMAL LOW (ref 39.0–52.0)
Hemoglobin: 9.2 g/dL — ABNORMAL LOW (ref 13.0–17.0)
MCH: 26.7 pg (ref 26.0–34.0)
MCHC: 31.9 g/dL (ref 30.0–36.0)
MCV: 83.5 fL (ref 80.0–100.0)
Platelets: 407 10*3/uL — ABNORMAL HIGH (ref 150–400)
RBC: 3.45 MIL/uL — ABNORMAL LOW (ref 4.22–5.81)
RDW: 14.8 % (ref 11.5–15.5)
WBC: 11.5 10*3/uL — ABNORMAL HIGH (ref 4.0–10.5)
nRBC: 0 % (ref 0.0–0.2)

## 2025-01-13 MED ORDER — SODIUM CHLORIDE 0.9 % IV SOLN
3.0000 g | Freq: Four times a day (QID) | INTRAVENOUS | Status: DC
  Administered 2025-01-13 – 2025-01-14 (×4): 3 g via INTRAVENOUS
  Filled 2025-01-13 (×5): qty 8

## 2025-01-13 MED ORDER — LIDOCAINE 5 % EX PTCH
2.0000 | MEDICATED_PATCH | CUTANEOUS | Status: AC
  Administered 2025-01-13 – 2025-01-25 (×13): 2 via TRANSDERMAL
  Filled 2025-01-13 (×13): qty 2

## 2025-01-13 NOTE — Progress Notes (Signed)
 " PROGRESS NOTE    Joseph [name]  FMW:[id number] DOB: 08/07/1971 DOA: 01/09/2025 PCP: Alec House, MD    Brief Narrative:  54 year old male with past medical history of asthma, COPD, chronic bronchitis, morbid obesity, OSA, diabetes, GERD, HTN, HLD, PE, DVT on Xarelto , PAD s/p bilateral BKA, CAD s/p CABG x4 who initially presented to the emergency department with chest pain.    Assessment and Plan: Acute metabolic encephalopathy  Likely mixed uremia, CO2 narcosis, DKA. Also polypharmacy, on xanaflex, lyrica , oxycodone , wellbutrin , xanax   -improving with treating metabolic derangements and BIPAP -continue to monitor     Hyperglycemia/Hyperkalemia /Hyponatremia  - Was on insulin  drip but has been changed to subcu insulin -titrate up -lokelma  with resolution of hyperkalemia     AKI on CKD /AGMA-stage IIIb Possibly dehydration/pre-renal AKI on CKD  - UA bland, nephro consult appreciated -strict I/Os -cath 1/21--? Worsened from contrast -foley in place--Will plan to remove in the next 24 hours - Nephrology signed off as patient appears to be back at baseline-Will need follow-up with Dr. Macel in 2 to 3 weeks   SIRS, query infection from his right leg/stump cellulitis - obtain UA, CXR,covid/flu/rsv, 20 pathogen panel -- negative thus far - IV antibiotics - X-ray with subcu edema    - Blood cultures positive but deemed to be contaminant by pharmacy-Will continue to monitor  COPD  Chronic bronchitis Asthma  OSA on CPAP  -wean off O2 as able    CAD s/p CABG x4 Left shoulder pain  Concern this was anginal pain, cath was clear. Doubt PE as no hypoxia  Most recent echo 03/2024 with EF 55-60%,, RV normal -echo - cardiology signed off - cath clean  - asa/plavix  cont    History of PE, DVT  - not on home DOAC  -PE was remote in 2014   Depression  Anxiety   - continue wellbutrin  and lexapro     Chronica pain - Begin to resume home medications  Obesity Estimated body  mass index is 70.06 kg/m as calculated from the following:   Height as of this encounter: 5' 1 (1.549 m).   Weight as of this encounter: 168.2 kg.    DVT prophylaxis: heparin  injection 5,000 Units Start: 01/09/25 2200    Code Status: Full Code   Disposition Plan:  Level of care: Progressive Status is: Inpatient     Consultants:  PCCM cards   Subjective: Stump pain improved but complaining of bilateral shoulder pains (x-rays negative)  Objective: Vitals:   01/12/25 2350 01/13/25 0000 01/13/25 0346 01/13/25 0807  BP: (!) 132/92  119/88 119/78  Pulse: 88 88 90 88  Resp: 20 16 20 18   Temp: 98.8 F (37.1 C)  99.2 F (37.3 C) 98.4 F (36.9 C)  TempSrc: Oral  Oral Oral  SpO2:   95% 100%  Weight:   (!) 168.2 kg   Height:        Intake/Output Summary (Last 24 hours) at 01/13/2025 1133 Last data filed at 01/13/2025 0350 Gross per 24 hour  Intake 100 ml  Output 1400 ml  Net -1300 ml   Filed Weights   01/10/25 0415 01/12/25 0556 01/13/25 0346  Weight: (!) 170 kg (!) 165.8 kg (!) 168.2 kg    Examination:   General: Appearance:    Severely obese male in no acute distress     Lungs:     respirations unlabored  Heart:    Normal heart rate.    MS:  B/l amputations   Neurologic:  Awake, alert, weak appearing-poor insight into disease processes       Data Reviewed: I have personally reviewed following labs and imaging studies  CBC: Recent Labs  Lab 01/09/25 1920 01/09/25 1930 01/09/25 2240 01/09/25 2249 01/10/25 0148 01/11/25 0247 01/12/25 0323 01/13/25 0313  WBC 14.0*  --  12.9*  --  11.7* 13.3* 12.2* 11.5*  NEUTROABS 11.8*  --   --   --   --   --   --   --   HGB 10.0*   < > 10.0* 9.5* 9.3* 9.8* 8.9* 9.2*  HCT 31.2*   < > 31.4* 28.0* 29.1* 29.7* 27.7* 28.8*  MCV 84.1  --  85.6  --  83.6 83.7 83.2 83.5  PLT 299  --  297  --  259 324 400 407*   < > = values in this interval not displayed.   Basic Metabolic Panel: Recent Labs  Lab 01/09/25 2240  01/09/25 2249 01/10/25 1007 01/10/25 1329 01/11/25 0247 01/12/25 0323 01/13/25 0313  NA 128*   < > 135 134* 136 137 136  K 6.6*   < > 4.8 5.2* 5.3* 4.9 5.0  CL 95*   < > 100 100 99 98 100  CO2 14*   < > 23 22 23 22 24   GLUCOSE 391*   < > 218* 197* 185* 258* 223*  BUN 91*   < > 83* 80* 81* 79* 69*  CREATININE 4.62*   < > 4.09* 3.96* 3.86* 3.67* 3.05*  CALCIUM  8.7*   < > 8.5* 8.7* 8.6* 8.8* 8.9  MG 1.8  --   --   --  1.8  --   --   PHOS 4.2  --   --   --   --   --   --    < > = values in this interval not displayed.   GFR: Estimated Creatinine Clearance: 40 mL/min (A) (by C-G formula based on SCr of 3.05 mg/dL (H)). Liver Function Tests: Recent Labs  Lab 01/09/25 1920  AST 21  ALT 23  ALKPHOS 134*  BILITOT 0.7  PROT 7.4  ALBUMIN  3.0*   Recent Labs  Lab 01/09/25 2240  LIPASE 19   Recent Labs  Lab 01/09/25 2240  AMMONIA 17   Coagulation Profile: Recent Labs  Lab 01/09/25 1920  INR 1.4*   Cardiac Enzymes: No results for input(s): CKTOTAL, CKMB, CKMBINDEX, TROPONINI in the last 168 hours. BNP (last 3 results) Recent Labs    04/27/24 1051 12/17/24 1056 01/10/25 1007  PROBNP 895* 285* 576.0*   HbA1C: No results for input(s): HGBA1C in the last 72 hours.  CBG: Recent Labs  Lab 01/12/25 1158 01/12/25 1754 01/12/25 2111 01/13/25 0613 01/13/25 0808  GLUCAP 281* 255* 273* 224* 201*   Lipid Profile: No results for input(s): CHOL, HDL, LDLCALC, TRIG, CHOLHDL, LDLDIRECT in the last 72 hours.  Thyroid Function Tests: No results for input(s): TSH, T4TOTAL, FREET4, T3FREE, THYROIDAB in the last 72 hours. Anemia Panel: No results for input(s): VITAMINB12, FOLATE, FERRITIN, TIBC, IRON, RETICCTPCT in the last 72 hours. Sepsis Labs: Recent Labs  Lab 01/09/25 1930 01/09/25 2240  LATICACIDVEN 0.8 1.9    Recent Results (from the past 240 hours)  MRSA Next Gen by PCR, Nasal     Status: Abnormal   Collection Time:  01/09/25  8:24 PM   Specimen: Nasal Mucosa; Nasal Swab  Result Value Ref Range Status   MRSA by PCR Next Gen DETECTED (A) NOT DETECTED Final  Comment: RESULT CALLED TO, READ BACK BY AND VERIFIED WITH: J CRUZ REYES RN 01/09/2025 @ 2250 BY AB (NOTE) The GeneXpert MRSA Assay (FDA approved for NASAL specimens only), is one component of a comprehensive MRSA colonization surveillance program. It is not intended to diagnose MRSA infection nor to guide or monitor treatment for MRSA infections. Test performance is not FDA approved in patients less than 43 years old. Performed at [facility], [address] [place], [name] [address]   Culture, blood (Routine X 2) w Reflex to ID Panel     Status: None (Preliminary result)   Collection Time: 01/09/25  8:43 PM   Specimen: BLOOD  Result Value Ref Range Status   Specimen Description BLOOD SITE NOT SPECIFIED  Final   Special Requests   Final    BOTTLES DRAWN AEROBIC AND ANAEROBIC Blood Culture results may not be optimal due to an inadequate volume of blood received in culture bottles   Culture  Setup Time   Final    GRAM POSITIVE RODS AEROBIC BOTTLE ONLY CRITICAL RESULT CALLED TO, READ BACK BY AND VERIFIED WITH: PHARMD A. PAYTES [id number] @ 2033 FH    Culture   Final    GRAM POSITIVE RODS IDENTIFICATION TO FOLLOW Performed at [facility], [address] [place], [name] [address]    Report Status PENDING  Incomplete  Resp panel by RT-PCR (RSV, Flu A&B, Covid) Anterior Nasal Swab     Status: None   Collection Time: 01/09/25  8:46 PM   Specimen: Anterior Nasal Swab  Result Value Ref Range Status   SARS Coronavirus 2 by RT PCR NEGATIVE NEGATIVE Final   Influenza A by PCR NEGATIVE NEGATIVE Final   Influenza B by PCR NEGATIVE NEGATIVE Final    Comment: (NOTE) The Xpert Xpress SARS-CoV-2/FLU/RSV plus assay is intended as an aid in the diagnosis of influenza from Nasopharyngeal swab specimens and should not be used as a sole basis  for treatment. Nasal washings and aspirates are unacceptable for Xpert Xpress SARS-CoV-2/FLU/RSV testing.  Fact Sheet for Patients: bloggercourse.com  Fact Sheet for Healthcare Providers: seriousbroker.it  This test is not yet approved or cleared by the [place]  FDA and has been authorized for detection and/or diagnosis of SARS-CoV-2 by FDA under an Emergency Use Authorization (EUA). This EUA will remain in effect (meaning this test can be used) for the duration of the COVID-19 declaration under Section 564(b)(1) of the Act, 21 U.S.C. section 360bbb-3(b)(1), unless the authorization is terminated or revoked.     Resp Syncytial Virus by PCR NEGATIVE NEGATIVE Final    Comment: (NOTE) Fact Sheet for Patients: bloggercourse.com  Fact Sheet for Healthcare Providers: seriousbroker.it  This test is not yet approved or cleared by the [place]  FDA and has been authorized for detection and/or diagnosis of SARS-CoV-2 by FDA under an Emergency Use Authorization (EUA). This EUA will remain in effect (meaning this test can be used) for the duration of the COVID-19 declaration under Section 564(b)(1) of the Act, 21 U.S.C. section 360bbb-3(b)(1), unless the authorization is terminated or revoked.  Performed at [facility], [address] [place], [name] [address]   Respiratory (~20 pathogens) panel by PCR     Status: None   Collection Time: 01/09/25  8:46 PM   Specimen: Nasopharyngeal Swab; Respiratory  Result Value Ref Range Status   Adenovirus NOT DETECTED NOT DETECTED Final   Coronavirus 229E NOT DETECTED NOT DETECTED Final    Comment: (NOTE) The  Coronavirus on the Respiratory Panel, DOES NOT test for the novel  Coronavirus (2019 nCoV)    Coronavirus HKU1 NOT DETECTED NOT DETECTED Final   Coronavirus NL63 NOT DETECTED NOT DETECTED Final   Coronavirus OC43 NOT  DETECTED NOT DETECTED Final   Metapneumovirus NOT DETECTED NOT DETECTED Final   Rhinovirus / Enterovirus NOT DETECTED NOT DETECTED Final   Influenza A NOT DETECTED NOT DETECTED Final   Influenza B NOT DETECTED NOT DETECTED Final   Parainfluenza Virus 1 NOT DETECTED NOT DETECTED Final   Parainfluenza Virus 2 NOT DETECTED NOT DETECTED Final   Parainfluenza Virus 3 NOT DETECTED NOT DETECTED Final   Parainfluenza Virus 4 NOT DETECTED NOT DETECTED Final   Respiratory Syncytial Virus NOT DETECTED NOT DETECTED Final   Bordetella pertussis NOT DETECTED NOT DETECTED Final   Bordetella Parapertussis NOT DETECTED NOT DETECTED Final   Chlamydophila pneumoniae NOT DETECTED NOT DETECTED Final   Mycoplasma pneumoniae NOT DETECTED NOT DETECTED Final    Comment: Performed at [facility], [address], [place], [name] [address]  Culture, blood (Routine X 2) w Reflex to ID Panel     Status: None (Preliminary result)   Collection Time: 01/09/25 10:40 PM   Specimen: BLOOD  Result Value Ref Range Status   Specimen Description BLOOD SITE NOT SPECIFIED  Final   Special Requests   Final    BOTTLES DRAWN AEROBIC ONLY Blood Culture results may not be optimal due to an inadequate volume of blood received in culture bottles   Culture   Final    NO GROWTH 4 DAYS Performed at [facility], [address], [place], [name] [address]    Report Status PENDING  Incomplete         Radiology Studies: DG Shoulder Right Result Date: 01/12/2025 EXAM: 1 VIEW(S) XRAY OF THE SHOULDER 01/12/2025 02:19:00 PM COMPARISON: 10/01/2023 CLINICAL HISTORY: Pain. FINDINGS: BONES AND JOINTS: Glenohumeral joint is normally aligned. Acromioclavicular joint is normally aligned. Mild degenerative changes are present. No acute fracture. No malalignment. SOFT TISSUES: Sternotomy wires noted. No abnormal calcifications. Visualized lung is unremarkable. IMPRESSION: 1. No acute findings. 2. Mild degenerative changes in the  glenohumeral and acromioclavicular joints. Electronically signed by: Oneil Devonshire MD 01/12/2025 05:31 PM EST RP Workstation: [id number]   DG Shoulder Left Result Date: 01/12/2025 EXAM: 2 VIEW(S) XRAY OF THE LEFT SHOULDER 01/12/2025 02:19:00 PM COMPARISON: None available. CLINICAL HISTORY: Pain. FINDINGS: BONES AND JOINTS: Glenohumeral joint is normally aligned. No acute fracture. The [facility] joint is unremarkable. SOFT TISSUES: No abnormal calcifications. Visualized lung is unremarkable. IMPRESSION: 1. No acute findings. Electronically signed by: Oneil Devonshire MD 01/12/2025 05:30 PM EST RP Workstation: [id number]        Scheduled Meds:  aspirin  EC  81 mg Oral q AM   atorvastatin   80 mg Oral Daily   buPROPion   300 mg Oral q morning   Chlorhexidine  Gluconate Cloth  6 each Topical Daily   clopidogrel   75 mg Oral Daily   feeding supplement  237 mL Oral BID BM   heparin   5,000 Units Subcutaneous Q8H   insulin  aspart  0-15 Units Subcutaneous TID WC   insulin  aspart  0-5 Units Subcutaneous QHS   insulin  glargine  45 Units Subcutaneous Daily   lidocaine   2 patch Transdermal Q24H   linezolid   600 mg Oral Q12H   mupirocin  ointment  1 Application Nasal BID   sodium chloride  flush  3 mL Intravenous Q12H   Continuous Infusions:  ampicillin -sulbactam (  UNASYN ) IV 3 g (01/13/25 0527)   insulin  Stopped (01/11/25 1455)     LOS: 4 days    Time spent: 35 minutes spent on chart review, discussion with nursing staff, consultants, updating family and interview/physical exam; more than 50% of that time was spent in counseling and/or coordination of care.    Harlene RAYMOND Bowl, DO [facility] Available via Epic secure chat 7am-7pm After these hours, please refer to coverage provider listed on [email or web address] 01/13/2025, 11:33 AM   "

## 2025-01-13 NOTE — TOC CM/SW Note (Signed)
 SDOH needs addressed, resources added to AVS and will print for discharge.

## 2025-01-13 NOTE — Discharge Instructions (Addendum)
 Non-Emergency Medical Transportation (NEMT) services help get you to your Medicaid appointments. You may be able to get rides if you have a Medicaid Standard Plan, a Tailored Plan, the EBCI Trib[place] Option or the [employer] program (See How to Schedule a Ride below for a complete list of health plans and programs that can use this service.)  You should ask for your ride at least 2 days before your appointment. For urgent pickups, like leaving the hospital or going to the pharmacy, you do not need to ask ahead of time. [place] Medicaid Direct beneficiaries should ask for a ride at least 4 days before their appointment.  Call the number listed below for your Medicaid health plan or program (shown on your health plan ID card) to schedule a ride:  [place] Medicaid Direct or [employer] your local Department of Social Services. Not sure who to call? The [place] Medicaid Enrollment Broker can help you find your health care option: call [phone].

## 2025-01-13 NOTE — Progress Notes (Signed)
 PHARMACY NOTE:  ANTIMICROBIAL RENAL DOSAGE ADJUSTMENT  Current antimicrobial regimen includes a mismatch between antimicrobial dosage and estimated renal function.  As per policy approved by the Pharmacy & Therapeutics and Medical Executive Committees, the antimicrobial dosage will be adjusted accordingly.  Current antimicrobial dosage:  Unasyn  3g IV q8h  Indication: Cellulitis  Renal Function:  Estimated Creatinine Clearance: 40 mL/min (A) (by C-G formula based on SCr of 3.05 mg/dL (H)).    Antimicrobial dosage has been changed to:  Unasyn  3g IV q6h  Additional comments:   Thank you for allowing pharmacy to be a part of this patient's care.  Izetta Carl, PharmD PGY1 Pharmacy Resident

## 2025-01-13 NOTE — Care Management Important Message (Signed)
 Important Message  Patient Details  Name: Joseph [name] MRN: [id number] Date of Birth: 1971-11-21   Important Message Given:  Yes - Medicare IM  Printed to unit and bedside RN to give to pt   Charlann Rayfield Hurst, RN 01/13/2025, 10:06 AM

## 2025-01-13 NOTE — Plan of Care (Signed)

## 2025-01-13 NOTE — Progress Notes (Signed)
 Patient ID: Joseph [name], male   DOB: [birth year], 54 y.o.   MRN: [id number] [facility] Progress Note   Assessment/ Plan:   1. Acute kidney Injury on chronic kidney disease stage IIIb (baseline creatinine 2.2-2.5): Likely multifactorial acute kidney injury in the setting of urine retention/postrenal mechanism, hemodynamic renal injury from ongoing medications and recent contrast exposure.  Nonoliguric overnight with continued downtrend of creatinine reflective of ongoing renal recovery.  Nephrology will sign off at this time and set him up for a follow-up appointment with Dr. Macel in 2 to 3 weeks (his nephrologist at [facility]) 2.  Hyperkalemia: Secondary to acute kidney injury, treated successfully with medical management including diuresis and Lokelma . 3.  Coronary artery disease status post four-vessel CABG: Presentation with chest pain and underwent coronary angiography with high risk of suspicion for ACS.  Patent grafts noted. 4.  Hyperglycemia: On insulin  drip along with dextrose  per management protocol.  Subjective:   Complains of discomfort of resting in hospital bed   Objective:   BP 119/88 (BP Location: Right Wrist)   Pulse 90   Temp 99.2 F (37.3 C) (Oral)   Resp 20   Ht 5' 1 (1.549 m)   Wt (!) 168.2 kg   SpO2 95%   BMI 70.06 kg/m   Intake/Output Summary (Last 24 hours) at 01/13/2025 9342 Last data filed at 01/13/2025 0350 Gross per 24 hour  Intake 100 ml  Output 1400 ml  Net -1300 ml   Weight change: 2.4 kg  Physical Exam: Gen: Resting uncomfortably in hospital bed, awake/alert CVS: Pulse regular rhythm, normal rate, S1 and S2 normal Resp: Diminished breath sounds over bases, no distinct rales or rhonchi Abd: Soft, obese, nontender, bowel sounds normal Ext: Status post bilateral BKA with trace edema over stumps  Imaging: DG Shoulder Right Result Date: 01/12/2025 EXAM: 1 VIEW(S) XRAY OF THE SHOULDER 01/12/2025 02:19:00 PM COMPARISON:  10/01/2023 CLINICAL HISTORY: Pain. FINDINGS: BONES AND JOINTS: Glenohumeral joint is normally aligned. Acromioclavicular joint is normally aligned. Mild degenerative changes are present. No acute fracture. No malalignment. SOFT TISSUES: Sternotomy wires noted. No abnormal calcifications. Visualized lung is unremarkable. IMPRESSION: 1. No acute findings. 2. Mild degenerative changes in the glenohumeral and acromioclavicular joints. Electronically signed by: Oneil Devonshire MD 01/12/2025 05:31 PM EST RP Workstation: [id number]   DG Shoulder Left Result Date: 01/12/2025 EXAM: 2 VIEW(S) XRAY OF THE LEFT SHOULDER 01/12/2025 02:19:00 PM COMPARISON: None available. CLINICAL HISTORY: Pain. FINDINGS: BONES AND JOINTS: Glenohumeral joint is normally aligned. No acute fracture. The [facility] joint is unremarkable. SOFT TISSUES: No abnormal calcifications. Visualized lung is unremarkable. IMPRESSION: 1. No acute findings. Electronically signed by: Oneil Devonshire MD 01/12/2025 05:30 PM EST RP Workstation: [id number]   DG Tibia/Fibula Right Port Result Date: 01/11/2025 EXAM: _VIEWS_ VIEW(S) XRAY OF THE RIGHT TIBIA AND FIBULA 01/11/2025 09:08:00 AM COMPARISON: None available. CLINICAL HISTORY: Stump pain (HCC). ICD10: 290900 Stump pain (HCC). FINDINGS: BONES AND JOINTS: Postsurgical changes of a below knee amputation. No acute fracture or dislocation. No malalignment. Mild degenerative changes of the knee. SOFT TISSUES: A couple of surgical clips noted in the soft tissues about the knee. Subcutaneous edema throughout the visualized leg. IMPRESSION: 1. Subcutaneous edema throughout the visualized leg. No acute fracture or dislocation. 2. Mild degenerative changes of the knee. Electronically signed by: Rogelia Myers MD 01/11/2025 09:14 AM EST RP Workstation: [id number]    Labs: BMET Recent Labs  Lab 01/09/25 2240 01/09/25 2249 01/10/25 0148 01/10/25 9685 01/10/25 0725 01/10/25 1007 01/10/25  1329 01/11/25 0247 01/12/25 0323  01/13/25 0313  NA 128*   < > 133*  --  134* 135 134* 136 137 136  K 6.6*   < > 5.5* 5.1 5.0 4.8 5.2* 5.3* 4.9 5.0  CL 95*  --  98  --  100 100 100 99 98 100  CO2 14*  --  21*  --  23 23 22 23 22 24   GLUCOSE 391*  --  302*  --  228* 218* 197* 185* 258* 223*  BUN 91*  --  88*  --  82* 83* 80* 81* 79* 69*  CREATININE 4.62*  --  4.42*  --  4.09* 4.09* 3.96* 3.86* 3.67* 3.05*  CALCIUM  8.7*  --  8.6*  --  8.6* 8.5* 8.7* 8.6* 8.8* 8.9  PHOS 4.2  --   --   --   --   --   --   --   --   --    < > = values in this interval not displayed.   CBC Recent Labs  Lab 01/09/25 1920 01/09/25 1930 01/10/25 0148 01/11/25 0247 01/12/25 0323 01/13/25 0313  WBC 14.0*   < > 11.7* 13.3* 12.2* 11.5*  NEUTROABS 11.8*  --   --   --   --   --   HGB 10.0*   < > 9.3* 9.8* 8.9* 9.2*  HCT 31.2*   < > 29.1* 29.7* 27.7* 28.8*  MCV 84.1   < > 83.6 83.7 83.2 83.5  PLT 299   < > 259 324 400 407*   < > = values in this interval not displayed.    Medications:     aspirin  EC  81 mg Oral q AM   atorvastatin   80 mg Oral Daily   buPROPion   300 mg Oral q morning   Chlorhexidine  Gluconate Cloth  6 each Topical Daily   clopidogrel   75 mg Oral Daily   diclofenac  Sodium  2 g Topical QID   feeding supplement  237 mL Oral BID BM   heparin   5,000 Units Subcutaneous Q8H   insulin  aspart  0-15 Units Subcutaneous TID WC   insulin  aspart  0-5 Units Subcutaneous QHS   insulin  glargine  45 Units Subcutaneous Daily   linezolid   600 mg Oral Q12H   mupirocin  ointment  1 Application Nasal BID   sodium chloride  flush  3 mL Intravenous Q12H    Gordy Blanch, MD 01/13/2025, 6:57 AM

## 2025-01-14 ENCOUNTER — Inpatient Hospital Stay (HOSPITAL_COMMUNITY)

## 2025-01-14 ENCOUNTER — Other Ambulatory Visit (HOSPITAL_COMMUNITY): Payer: Self-pay

## 2025-01-14 DIAGNOSIS — M79622 Pain in left upper arm: Secondary | ICD-10-CM | POA: Diagnosis not present

## 2025-01-14 DIAGNOSIS — R079 Chest pain, unspecified: Secondary | ICD-10-CM | POA: Diagnosis not present

## 2025-01-14 DIAGNOSIS — L03119 Cellulitis of unspecified part of limb: Secondary | ICD-10-CM | POA: Diagnosis not present

## 2025-01-14 LAB — CBC
HCT: 33.8 % — ABNORMAL LOW (ref 39.0–52.0)
Hemoglobin: 10.7 g/dL — ABNORMAL LOW (ref 13.0–17.0)
MCH: 27 pg (ref 26.0–34.0)
MCHC: 31.7 g/dL (ref 30.0–36.0)
MCV: 85.4 fL (ref 80.0–100.0)
Platelets: 465 10*3/uL — ABNORMAL HIGH (ref 150–400)
RBC: 3.96 MIL/uL — ABNORMAL LOW (ref 4.22–5.81)
RDW: 15 % (ref 11.5–15.5)
WBC: 12.5 10*3/uL — ABNORMAL HIGH (ref 4.0–10.5)
nRBC: 0 % (ref 0.0–0.2)

## 2025-01-14 LAB — BASIC METABOLIC PANEL WITH GFR
Anion gap: 16 — ABNORMAL HIGH (ref 5–15)
BUN: 61 mg/dL — ABNORMAL HIGH (ref 6–20)
CO2: 20 mmol/L — ABNORMAL LOW (ref 22–32)
Calcium: 9.2 mg/dL (ref 8.9–10.3)
Chloride: 101 mmol/L (ref 98–111)
Creatinine, Ser: 2.76 mg/dL — ABNORMAL HIGH (ref 0.61–1.24)
GFR, Estimated: 27 mL/min — ABNORMAL LOW
Glucose, Bld: 229 mg/dL — ABNORMAL HIGH (ref 70–99)
Potassium: 5.8 mmol/L — ABNORMAL HIGH (ref 3.5–5.1)
Sodium: 137 mmol/L (ref 135–145)

## 2025-01-14 LAB — GLUCOSE, CAPILLARY
Glucose-Capillary: 223 mg/dL — ABNORMAL HIGH (ref 70–99)
Glucose-Capillary: 288 mg/dL — ABNORMAL HIGH (ref 70–99)
Glucose-Capillary: 338 mg/dL — ABNORMAL HIGH (ref 70–99)
Glucose-Capillary: 290 mg/dL — ABNORMAL HIGH (ref 70–99)

## 2025-01-14 LAB — CULTURE, BLOOD (ROUTINE X 2): Culture: NO GROWTH

## 2025-01-14 MED ORDER — AMOXICILLIN-POT CLAVULANATE 875-125 MG PO TABS
1.0000 | ORAL_TABLET | Freq: Two times a day (BID) | ORAL | Status: AC
  Administered 2025-01-14 – 2025-01-16 (×6): 1 via ORAL
  Filled 2025-01-14 (×6): qty 1

## 2025-01-14 MED ORDER — INSULIN ASPART 100 UNIT/ML IJ SOLN
3.0000 [IU] | Freq: Three times a day (TID) | INTRAMUSCULAR | Status: DC
  Filled 2025-01-14: qty 1

## 2025-01-14 MED ORDER — ALPRAZOLAM 0.5 MG PO TABS
2.0000 mg | ORAL_TABLET | Freq: Two times a day (BID) | ORAL | Status: DC | PRN
  Administered 2025-01-15 – 2025-01-19 (×4): 2 mg via ORAL
  Filled 2025-01-14 (×4): qty 4

## 2025-01-14 MED ORDER — PREGABALIN 75 MG PO CAPS
150.0000 mg | ORAL_CAPSULE | Freq: Two times a day (BID) | ORAL | Status: DC
  Administered 2025-01-14 – 2025-01-20 (×13): 150 mg via ORAL
  Filled 2025-01-14 (×13): qty 2

## 2025-01-14 MED ORDER — INSULIN GLARGINE 100 UNIT/ML ~~LOC~~ SOLN
50.0000 [IU] | Freq: Every day | SUBCUTANEOUS | Status: DC
  Administered 2025-01-14 – 2025-01-16 (×3): 50 [IU] via SUBCUTANEOUS
  Filled 2025-01-14 (×4): qty 0.5

## 2025-01-14 NOTE — Inpatient Diabetes Management (Signed)
 Inpatient Diabetes Program Recommendations  AACE/ADA: New Consensus Statement on Inpatient Glycemic Control (2015)  Target Ranges:  Prepandial:   less than 140 mg/dL      Peak postprandial:   less than 180 mg/dL (1-2 hours)      Critically ill patients:  140 - 180 mg/dL   Lab Results  Component Value Date   GLUCAP 223 (H) 01/14/2025   HGBA1C 7.9 (H) 01/09/2025    Review of Glycemic Control  Diabetes history: DM2 Outpatient Diabetes medications: Lantus  50 BID, Humalog  12 TID, metformin  1000 mg daily, Farxiga  10 daily Current orders for Inpatient glycemic control: Lantus  45 units daily, Novolog  0-15 TID with meals and 0-5 HS  Inpatient Diabetes Program Recommendations:    Please consider:  Add Lantus  to 25 units at bedtime (continue Lantus  45 units daily)  Novolog  5 units TID with meals if eating > 50%  Decrease Novolog  s/s to 0-9 TID with meals  Continue to follow glucose trends.  Thank you. Shona Brandy, RD, LDN, CDCES Inpatient Diabetes Coordinator [phone]

## 2025-01-14 NOTE — Progress Notes (Addendum)
 " PROGRESS NOTE    Joseph [name]  FMW:[id number] DOB: [birth year] DOA: 01/09/2025 PCP: Alec House, MD    Brief Narrative:  54 year old male with past medical history of asthma, COPD, chronic bronchitis, morbid obesity, OSA, diabetes, GERD, HTN, HLD, PE, DVT on Xarelto , PAD s/p bilateral BKA, CAD s/p CABG x4 who initially presented to the emergency department with chest pain.  Patient with multiple complaints of  Assessment and Plan: Acute metabolic encephalopathy  Likely mixed uremia, CO2 narcosis, DKA. Also polypharmacy, on xanaflex, lyrica , oxycodone , wellbutrin , xanax   -improving with treating metabolic derangements and BIPAP -continue to monitor   Multiple complaints of pain -Complained of bilateral numbness on 1/25-CT scan of cervical spine unrevealing - On 1/26 complained of cold left hand-patient did have cath attempt on this 5 days ago so we will check ultrasound-no change of color and bilateral hands are cold to touch as room is cold (U/S also unrevealing)  Hyperglycemia/Hyperkalemia /Hyponatremia  - Was on insulin  drip but has been changed to subcu insulin -titrate up -lokelma  with resolution of hyperkalemia     AKI on CKD /AGMA-stage IIIb Possibly dehydration/pre-renal AKI on CKD  - UA bland, nephro consult appreciated -strict I/Os -cath 1/21--? Worsened from contrast - DC Foley - Nephrology signed off as patient appears to be back at baseline-Will need follow-up with Dr. Macel in 2 to 3 weeks   SIRS, query infection from his right leg/stump cellulitis - obtain UA, CXR,covid/flu/rsv, 20 pathogen panel -- negative thus far - IV antibiotics - X-ray with subcu edema    - Blood cultures positive but deemed to be contaminant by pharmacy-Will continue to monitor  COPD  Chronic bronchitis Asthma  OSA on CPAP  -wean off O2 as able    CAD s/p CABG x4 Left shoulder pain  Concern this was anginal pain, cath was clear. Doubt PE as no hypoxia  Most recent echo 03/2024  with EF 55-60%,, RV normal -echo - cardiology signed off - cath clean  - asa/plavix  cont    History of PE, DVT  - not on home DOAC  -PE was remote in 2014   Depression  Anxiety   - continue wellbutrin  and lexapro     Chronica pain - Begin to resume home medications  Obesity Estimated body mass index is 67.15 kg/m as calculated from the following:   Height as of this encounter: 5' 1 (1.549 m).   Weight as of this encounter: 161.2 kg.    DVT prophylaxis: heparin  injection 5,000 Units Start: 01/09/25 2200    Code Status: Full Code   Disposition Plan:  Level of care: Progressive Status is: Inpatient     Consultants:  PCCM cards   Subjective: Multiple complaints today patient is complaining of coldness in the left hand  Objective: Vitals:   01/13/25 2323 01/14/25 0313 01/14/25 0731 01/14/25 1137  BP: 131/78 (!) 113/91 (!) 105/52 (!) 108/96  Pulse: 85 89 94 (!) 103  Resp: 16 16 14 19   Temp: 98.2 F (36.8 C) 98.8 F (37.1 C) 98.1 F (36.7 C) 98.2 F (36.8 C)  TempSrc: Oral Oral Oral Oral  SpO2:  99% 98% 97%  Weight:  (!) 161.2 kg    Height:        Intake/Output Summary (Last 24 hours) at 01/14/2025 1234 Last data filed at 01/14/2025 1024 Gross per 24 hour  Intake 200 ml  Output 3800 ml  Net -3600 ml   Filed Weights   01/12/25 0556 01/13/25 0346 01/14/25 0313  Weight: ROLLEN)  165.8 kg (!) 168.2 kg (!) 161.2 kg    Examination:   General: Appearance:    Severely obese male in no acute distress     Lungs:     respirations unlabored  Heart:    Tachycardic.    MS:  B/l amputations   Neurologic:   Awake, alert, weak appearing-poor insight into disease processes       Data Reviewed: I have personally reviewed following labs and imaging studies  CBC: Recent Labs  Lab 01/09/25 1920 01/09/25 1930 01/10/25 0148 01/11/25 0247 01/12/25 0323 01/13/25 0313 01/14/25 0139  WBC 14.0*   < > 11.7* 13.3* 12.2* 11.5* 12.5*  NEUTROABS 11.8*  --   --   --    --   --   --   HGB 10.0*   < > 9.3* 9.8* 8.9* 9.2* 10.7*  HCT 31.2*   < > 29.1* 29.7* 27.7* 28.8* 33.8*  MCV 84.1   < > 83.6 83.7 83.2 83.5 85.4  PLT 299   < > 259 324 400 407* 465*   < > = values in this interval not displayed.   Basic Metabolic Panel: Recent Labs  Lab 01/09/25 2240 01/09/25 2249 01/10/25 1329 01/11/25 0247 01/12/25 0323 01/13/25 0313 01/14/25 0139  NA 128*   < > 134* 136 137 136 137  K 6.6*   < > 5.2* 5.3* 4.9 5.0 5.8*  CL 95*   < > 100 99 98 100 101  CO2 14*   < > 22 23 22 24  20*  GLUCOSE 391*   < > 197* 185* 258* 223* 229*  BUN 91*   < > 80* 81* 79* 69* 61*  CREATININE 4.62*   < > 3.96* 3.86* 3.67* 3.05* 2.76*  CALCIUM  8.7*   < > 8.7* 8.6* 8.8* 8.9 9.2  MG 1.8  --   --  1.8  --   --   --   PHOS 4.2  --   --   --   --   --   --    < > = values in this interval not displayed.   GFR: Estimated Creatinine Clearance: 43 mL/min (A) (by C-G formula based on SCr of 2.76 mg/dL (H)). Liver Function Tests: Recent Labs  Lab 01/09/25 1920  AST 21  ALT 23  ALKPHOS 134*  BILITOT 0.7  PROT 7.4  ALBUMIN  3.0*   Recent Labs  Lab 01/09/25 2240  LIPASE 19   Recent Labs  Lab 01/09/25 2240  AMMONIA 17   Coagulation Profile: Recent Labs  Lab 01/09/25 1920  INR 1.4*   Cardiac Enzymes: No results for input(s): CKTOTAL, CKMB, CKMBINDEX, TROPONINI in the last 168 hours. BNP (last 3 results) Recent Labs    04/27/24 1051 12/17/24 1056 01/10/25 1007  PROBNP 895* 285* 576.0*   HbA1C: No results for input(s): HGBA1C in the last 72 hours.  CBG: Recent Labs  Lab 01/13/25 1209 01/13/25 1610 01/13/25 2122 01/14/25 0619 01/14/25 1136  GLUCAP 222* 220* 200* 223* 288*   Lipid Profile: No results for input(s): CHOL, HDL, LDLCALC, TRIG, CHOLHDL, LDLDIRECT in the last 72 hours.  Thyroid Function Tests: No results for input(s): TSH, T4TOTAL, FREET4, T3FREE, THYROIDAB in the last 72 hours. Anemia Panel: No results for  input(s): VITAMINB12, FOLATE, FERRITIN, TIBC, IRON, RETICCTPCT in the last 72 hours. Sepsis Labs: Recent Labs  Lab 01/09/25 1930 01/09/25 2240  LATICACIDVEN 0.8 1.9    Recent Results (from the past 240 hours)  MRSA Next Gen  by PCR, Nasal     Status: Abnormal   Collection Time: 01/09/25  8:24 PM   Specimen: Nasal Mucosa; Nasal Swab  Result Value Ref Range Status   MRSA by PCR Next Gen DETECTED (A) NOT DETECTED Final    Comment: RESULT CALLED TO, READ BACK BY AND VERIFIED WITH: J CRUZ REYES RN 01/09/2025 @ 2250 BY AB (NOTE) The GeneXpert MRSA Assay (FDA approved for NASAL specimens only), is one component of a comprehensive MRSA colonization surveillance program. It is not intended to diagnose MRSA infection nor to guide or monitor treatment for MRSA infections. Test performance is not FDA approved in patients less than 71 years old. Performed at [facility], [address], [place], [name] [address]   Culture, blood (Routine X 2) w Reflex to ID Panel     Status: Abnormal   Collection Time: 01/09/25  8:43 PM   Specimen: BLOOD  Result Value Ref Range Status   Specimen Description BLOOD SITE NOT SPECIFIED  Final   Special Requests   Final    BOTTLES DRAWN AEROBIC AND ANAEROBIC Blood Culture results may not be optimal due to an inadequate volume of blood received in culture bottles   Culture  Setup Time   Final    GRAM POSITIVE RODS AEROBIC BOTTLE ONLY CRITICAL RESULT CALLED TO, READ BACK BY AND VERIFIED WITH: PHARMD A. PAYTES [id number] @ 2033 FH    Culture (A)  Final    CORYNEBACTERIUM MINUTISSIMUM Standardized susceptibility testing for this organism is not available. Performed at [facility], [address], [place], [name] [address]    Report Status 01/14/2025 FINAL  Final  Resp panel by RT-PCR (RSV, Flu A&B, Covid) Anterior Nasal Swab     Status: None   Collection Time: 01/09/25  8:46 PM   Specimen: Anterior Nasal Swab  Result Value Ref Range  Status   SARS Coronavirus 2 by RT PCR NEGATIVE NEGATIVE Final   Influenza A by PCR NEGATIVE NEGATIVE Final   Influenza B by PCR NEGATIVE NEGATIVE Final    Comment: (NOTE) The Xpert Xpress SARS-CoV-2/FLU/RSV plus assay is intended as an aid in the diagnosis of influenza from Nasopharyngeal swab specimens and should not be used as a sole basis for treatment. Nasal washings and aspirates are unacceptable for Xpert Xpress SARS-CoV-2/FLU/RSV testing.  Fact Sheet for Patients: bloggercourse.com  Fact Sheet for Healthcare Providers: seriousbroker.it  This test is not yet approved or cleared by the [place]  FDA and has been authorized for detection and/or diagnosis of SARS-CoV-2 by FDA under an Emergency Use Authorization (EUA). This EUA will remain in effect (meaning this test can be used) for the duration of the COVID-19 declaration under Section 564(b)(1) of the Act, 21 U.S.C. section 360bbb-3(b)(1), unless the authorization is terminated or revoked.     Resp Syncytial Virus by PCR NEGATIVE NEGATIVE Final    Comment: (NOTE) Fact Sheet for Patients: bloggercourse.com  Fact Sheet for Healthcare Providers: seriousbroker.it  This test is not yet approved or cleared by the [place]  FDA and has been authorized for detection and/or diagnosis of SARS-CoV-2 by FDA under an Emergency Use Authorization (EUA). This EUA will remain in effect (meaning this test can be used) for the duration of the COVID-19 declaration under Section 564(b)(1) of the Act, 21 U.S.C. section 360bbb-3(b)(1), unless the authorization is terminated or revoked.  Performed at [facility], [address], [place], [name] [address]   Respiratory (~20 pathogens) panel by PCR  Status: None   Collection Time: 01/09/25  8:46 PM   Specimen: Nasopharyngeal Swab; Respiratory  Result Value Ref Range Status    Adenovirus NOT DETECTED NOT DETECTED Final   Coronavirus 229E NOT DETECTED NOT DETECTED Final    Comment: (NOTE) The Coronavirus on the Respiratory Panel, DOES NOT test for the novel  Coronavirus (2019 nCoV)    Coronavirus HKU1 NOT DETECTED NOT DETECTED Final   Coronavirus NL63 NOT DETECTED NOT DETECTED Final   Coronavirus OC43 NOT DETECTED NOT DETECTED Final   Metapneumovirus NOT DETECTED NOT DETECTED Final   Rhinovirus / Enterovirus NOT DETECTED NOT DETECTED Final   Influenza A NOT DETECTED NOT DETECTED Final   Influenza B NOT DETECTED NOT DETECTED Final   Parainfluenza Virus 1 NOT DETECTED NOT DETECTED Final   Parainfluenza Virus 2 NOT DETECTED NOT DETECTED Final   Parainfluenza Virus 3 NOT DETECTED NOT DETECTED Final   Parainfluenza Virus 4 NOT DETECTED NOT DETECTED Final   Respiratory Syncytial Virus NOT DETECTED NOT DETECTED Final   Bordetella pertussis NOT DETECTED NOT DETECTED Final   Bordetella Parapertussis NOT DETECTED NOT DETECTED Final   Chlamydophila pneumoniae NOT DETECTED NOT DETECTED Final   Mycoplasma pneumoniae NOT DETECTED NOT DETECTED Final    Comment: Performed at [facility], [address], [place], [name] [address]  Culture, blood (Routine X 2) w Reflex to ID Panel     Status: None (Preliminary result)   Collection Time: 01/09/25 10:40 PM   Specimen: BLOOD  Result Value Ref Range Status   Specimen Description BLOOD SITE NOT SPECIFIED  Final   Special Requests   Final    BOTTLES DRAWN AEROBIC ONLY Blood Culture results may not be optimal due to an inadequate volume of blood received in culture bottles   Culture   Final    NO GROWTH 4 DAYS Performed at [facility], [address], [place], [name] [address]    Report Status PENDING  Incomplete         Radiology Studies: CT CERVICAL SPINE WO CONTRAST Result Date: 01/13/2025 EXAM: CT CERVICAL SPINE WITHOUT CONTRAST 01/13/2025 03:39:02 PM TECHNIQUE: CT of the cervical spine was  performed without the administration of intravenous contrast. Multiplanar reformatted images are provided for review. Automated exposure control, iterative reconstruction, and/or weight based adjustment of the mA/kV was utilized to reduce the radiation dose to as low as reasonably achievable. COMPARISON: CT Cervical Spine without IV contrast 10/01/2023. CLINICAL HISTORY: Cervical radiculopathy, no red flags. Cervical radiculopathy. FINDINGS: BONES AND ALIGNMENT: No acute fracture or traumatic malalignment. DEGENERATIVE CHANGES: Intervertebral disc spaces are relatively maintained. At C2-C3 there is a small disc bulge. No significant osseous spinal canal stenosis. No significant foraminal stenosis. At C3-C4 there is a small disc bulge and thickening of the ligamentum flavum. Mild spinal canal stenosis. Mild uncovertebral hypertrophy on the left. There is mild left foraminal stenosis. At C4-C5 there is a small disc bulge and thickening of the ligamentum flavum. Mild spinal canal stenosis. No significant foraminal stenosis. At C5-C6 there is a small disc bulge. No significant spinal canal stenosis. No significant foraminal stenosis. At C6-C7 there is a small disc bulge and thickening of the ligamentum flavum. Suspect at least mild spinal canal stenosis at this level. No significant foraminal stenosis. At C7-T1 there is artifact through the spinal canal. No high grade spinal canal stenosis appreciated. Uncinate process hypertrophy on the right and mild facet arthrosis bilaterally. There is mild right foraminal stenosis. SOFT TISSUES: No  prevertebral soft tissue swelling. Multiple periapical lucencies. Dental caries. VASCULATURE: Mild atherosclerosis at the carotid bifurcations. IMPRESSION: 1. Degenerative changes as above. There is mild spinal canal stenosis at multiple levels. Mild foraminal stenosis on the left at C3-4 and on the right at C7-T1. 2. Multiple periapical lucencies and dental caries. Electronically signed  by: Donnice Mania MD 01/13/2025 08:23 PM EST RP Workstation: [id number]   DG Shoulder Right Result Date: 01/12/2025 EXAM: 1 VIEW(S) XRAY OF THE SHOULDER 01/12/2025 02:19:00 PM COMPARISON: 10/01/2023 CLINICAL HISTORY: Pain. FINDINGS: BONES AND JOINTS: Glenohumeral joint is normally aligned. Acromioclavicular joint is normally aligned. Mild degenerative changes are present. No acute fracture. No malalignment. SOFT TISSUES: Sternotomy wires noted. No abnormal calcifications. Visualized lung is unremarkable. IMPRESSION: 1. No acute findings. 2. Mild degenerative changes in the glenohumeral and acromioclavicular joints. Electronically signed by: Oneil Devonshire MD 01/12/2025 05:31 PM EST RP Workstation: [id number]   DG Shoulder Left Result Date: 01/12/2025 EXAM: 2 VIEW(S) XRAY OF THE LEFT SHOULDER 01/12/2025 02:19:00 PM COMPARISON: None available. CLINICAL HISTORY: Pain. FINDINGS: BONES AND JOINTS: Glenohumeral joint is normally aligned. No acute fracture. [facility] joint is unremarkable. SOFT TISSUES: No abnormal calcifications. Visualized lung is unremarkable. IMPRESSION: 1. No acute findings. Electronically signed by: Oneil Devonshire MD 01/12/2025 05:30 PM EST RP Workstation: [id number]        Scheduled Meds:  amoxicillin -clavulanate  1 tablet Oral Q12H   aspirin  EC  81 mg Oral q AM   atorvastatin   80 mg Oral Daily   buPROPion   300 mg Oral q morning   Chlorhexidine  Gluconate Cloth  6 each Topical Daily   clopidogrel   75 mg Oral Daily   feeding supplement  237 mL Oral BID BM   heparin   5,000 Units Subcutaneous Q8H   insulin  aspart  0-15 Units Subcutaneous TID WC   insulin  aspart  0-5 Units Subcutaneous QHS   insulin  aspart  3 Units Subcutaneous TID WC   insulin  glargine  50 Units Subcutaneous Daily   lidocaine   2 patch Transdermal Q24H   linezolid   600 mg Oral Q12H   mupirocin  ointment  1 Application Nasal BID   sodium chloride  flush  3 mL Intravenous Q12H   Continuous Infusions:     LOS: 5 days     Time spent: 35 minutes spent on chart review, discussion with nursing staff, consultants, updating family and interview/physical exam; more than 50% of that time was spent in counseling and/or coordination of care.    Harlene RAYMOND Bowl, DO [facility] Available via Epic secure chat 7am-7pm After these hours, please refer to coverage provider listed on [email or web address] 01/14/2025, 12:34 PM   "

## 2025-01-14 NOTE — TOC Progression Note (Signed)
 Transition of Care [facility]) - Progression Note    Patient Details  Name: Joseph [name] MRN: [id number] Date of Birth: [birth year]  [facility] of [facility]) CM/SW Contact  [name] Phone Number: [phone] 01/14/2025, 12:36 PM  Clinical Narrative:   [facility] CSW called and spoke with the patients wife who   [facility] CSW called and spoke with the patient who is agreeable to SNF. CSW explained that SNF process. Patient was faxed out SNF offers pending.   [facility] CSW/CM will continue to follow and monitor for dc readiness.     Expected Discharge Plan: Home/Self Care Barriers to Discharge: Continued Medical Work up               Expected Discharge Plan and Services       Living arrangements for the past 2 months: Single Family Home                                       Social Drivers of Health (SDOH) Interventions SDOH Screenings   Food Insecurity: No Food Insecurity (01/11/2025)  Housing: Low Risk (01/11/2025)  Transportation Needs: Unmet Transportation Needs (01/11/2025)  Utilities: Not At Risk (01/11/2025)  Financial Resource Strain: Medium Risk (09/09/2022)  Social Connections: Moderately Isolated (03/28/2024)  Stress: No Stress Concern Present (10/10/2023)   Received from [facility]  Tobacco Use: Medium Risk (01/12/2025)    Readmission Risk Interventions    05/03/2023   11:58 AM  Readmission Risk Prevention Plan  Transportation Screening Complete  PCP or Specialist Appt within 3-5 Days Complete  HRI or Home Care Consult Complete  Social Work Consult for Recovery Care Planning/Counseling Complete  Palliative Care Screening Not Applicable  Medication Review Oceanographer) Referral to Pharmacy

## 2025-01-14 NOTE — Progress Notes (Signed)
 VASCULAR LAB    Left upper extremity arterial duplex has been performed.  See CV proc for preliminary results.   Magdala Brahmbhatt, RVT 01/14/2025, 12:40 PM

## 2025-01-14 NOTE — Evaluation (Signed)
 Occupational Therapy Evaluation Patient Details Name: Joseph [name] MRN: [id number] DOB: April 07, 1971 Today's Date: 01/14/2025   History of Present Illness   Pt is a 54 y.o. male who presented 01/09/25 with chest pain. EKG showed ST changes in the anterior leads so he was taken to the cath lab, cath showed no acute lesion. Admitted with acute metabolic encephalopathy, hyperglycemia, hyperkalemia, hyponatremia, AKI on CKD, SIRS. PMH: asthma, COPD, chronic bronchitis, morbid obesity, OSA, diabetes, GERD, HTN, HLD, PE, DVT on Xarelto , PAD s/p bilateral BKA, CAD s/p CABG x4, aneurysm of ascending aorta, arthritis, DM2, MI, PE     Clinical Impressions Pt at PLOF lives with family but they can only provide very limited assist due to their own medical problems. He operates at [facility] level on main level of the home except he normally sleeps on the second floor of the home and crawls to the second level. At this time very limited due to pain and only was able to complete long sitting in bed and could not tolerate any rolling. Pt wanted to get dress and required max assist for UE. Nursing was aware of pain. Patient will benefit from continued inpatient follow up therapy, <3 hours/day.     If plan is discharge home, recommend the following:   Two people to help with walking and/or transfers;Two people to help with bathing/dressing/bathroom;Assistance with cooking/housework;Assistance with feeding;Assist for transportation;Help with stairs or ramp for entrance     Functional Status Assessment   Patient has had a recent decline in their functional status and demonstrates the ability to make significant improvements in function in a reasonable and predictable amount of time.     Equipment Recommendations   Hospital bed (potentially hoyer lift)     Recommendations for Other Services         Precautions/Restrictions   Precautions Precautions: Fall;Other (comment) Precaution/Restrictions  Comments: hypersensitive to all touch; chronic bil BKA; wounds reported on buttocks and noted at BKA Restrictions Weight Bearing Restrictions Per Provider Order: No     Mobility Bed Mobility Overal bed mobility: Needs Assistance             General bed mobility comments: Pt pulled himself to a sitting position with R hand and bed rail    Transfers                   General transfer comment: unable      Balance                                           ADL either performed or assessed with clinical judgement   ADL Overall ADL's : Needs assistance/impaired Eating/Feeding: Set up;Sitting   Grooming: Maximal assistance;Bed level   Upper Body Bathing: Maximal assistance;Total assistance;Bed level       Upper Body Dressing : Maximal assistance;Total assistance;Bed level                           Vision         Perception         Praxis         Pertinent Vitals/Pain Pain Assessment Pain Assessment: 0-10 Pain Score: 10-Worst pain ever Pain Location: everywhere per pt but noted only when moving neck no increase in pain Pain Descriptors / Indicators: Discomfort, Grimacing, Guarding, Moaning Pain Intervention(s): Limited activity within  patient's tolerance, Monitored during session, Premedicated before session, Relaxation     Extremity/Trunk Assessment Upper Extremity Assessment Upper Extremity Assessment: Right hand dominant (due to pain very limited participation but noted decrease in AROM in LUE compared to R) RUE Deficits / Details: was able to self feed and reach to top of head x2 attempt but all painful RUE Sensation: decreased light touch;history of peripheral neuropathy LUE Deficits / Details: Pt was able to finger walk arm up onto chest but then decrease in control and dropped back onto bed and same with attempt to reach to rail LUE Sensation: decreased light touch;history of peripheral neuropathy   Lower Extremity  Assessment Lower Extremity Assessment: Defer to PT evaluation       Communication Communication Communication: No apparent difficulties   Cognition Arousal: Alert Behavior During Therapy: Restless Cognition: No apparent impairments                               Following commands: Impaired Following commands impaired: Follows one step commands with increased time     Cueing  General Comments   Cueing Techniques: Verbal cues;Tactile cues;Gestural cues      Exercises     Shoulder Instructions      Home Living Family/patient expects to be discharged to:: Private residence Living Arrangements: Parent;Spouse/significant other (wife, mother and father) Available Help at Discharge: Family;Available 24 hours/day (but all have medical conditions) Type of Home: House Home Access: Ramped entrance     Home Layout: Two level;Bed/bath upstairs;1/2 bath on main level (unclear if they can convert main level home to live in) Alternate Level Stairs-Number of Steps: 15   Bathroom Shower/Tub: Chief Strategy Officer: Standard Bathroom Accessibility: No (wc slides on but, wc does not fit in)   Home Equipment: BSC/3in1;Wheelchair - power;Wheelchair - Forensic Psychologist (2 wheels)          Prior Functioning/Environment Prior Level of Function : Independent/Modified Independent;History of Falls (last six months)             Mobility Comments: Has had infections at residual limbs impacting him from being able to use his prosthetic legs. When he can use them he uses a RW to ambulate. Recently, has been crawling on the ground OOB and down the stairs to then pull himself up in his power w/c that stays downstairs. Reports recent fall ADLs Comments: mod I for ADLs and iADLs, but recently has been having issues with cooking and cleaning    OT Problem List: Pain;Impaired UE functional use;Decreased strength;Decreased range of motion;Decreased activity  tolerance;Decreased safety awareness;Decreased knowledge of use of DME or AE;Decreased coordination;Impaired balance (sitting and/or standing)   OT Treatment/Interventions:        OT Goals(Current goals can be found in the care plan section)   Acute Rehab OT Goals Patient Stated Goal: to have less pain OT Goal Formulation: With patient Time For Goal Achievement: 01/28/25 Potential to Achieve Goals: Fair   OT Frequency:  Min 2X/week    Co-evaluation              AM-PAC OT 6 Clicks Daily Activity     Outcome Measure Help from another person eating meals?: A Little Help from another person taking care of personal grooming?: A Lot Help from another person toileting, which includes using toliet, bedpan, or urinal?: Total Help from another person bathing (including washing, rinsing, drying)?: Total Help from another person to put on  and taking off regular upper body clothing?: A Lot Help from another person to put on and taking off regular lower body clothing?: Total 6 Click Score: 10   End of Session Nurse Communication: Mobility status (pain)  Activity Tolerance: Patient limited by pain Patient left: in bed;with call bell/phone within reach;with bed alarm set  OT Visit Diagnosis: Unsteadiness on feet (R26.81);Other abnormalities of gait and mobility (R26.89);Muscle weakness (generalized) (M62.81);Pain Pain - Right/Left:  (everywhere)                Time: 9161-9078 OT Time Calculation (min): 43 min Charges:  OT General Charges $OT Visit: 1 Visit OT Evaluation $OT Eval Moderate Complexity: 1 Mod OT Treatments $Self Care/Home Management : 23-37 mins  Warrick POUR OTR/L  Acute Rehab Services  [phone] office number   Warrick Berber 01/14/2025, 9:33 AM

## 2025-01-14 NOTE — NC FL2 (Signed)
 " [place]  MEDICAID FL2 LEVEL OF CARE FORM     IDENTIFICATION  Patient Name: Joseph [name] Birthdate: 11/27/1971 Sex: male Admission Date (Current Location): 01/09/2025  [facility] and [place] Number:  Producer, Television/film/video and Address:  [facility] [place], [address], [place], [name] [address]      Provider Number: [id number]  Attending Physician Name and Address:  Juvenal Harlene PENNER, DO  Relative Name and Phone Number:  Tiyon, Sanor, Emergency Contact  [phone]    Current Level of Care: Hospital Recommended Level of Care: Skilled Nursing Facility Prior Approval Number:    Date Approved/Denied:   PASRR Number: [id number]  Discharge Plan: SNF    Current Diagnoses: Patient Active Problem List   Diagnosis Date Noted   Chest pain 01/09/2025   Pressure injury 04/05/2024   UTI (urinary tract infection) 04/04/2024   Peripheral edema 03/31/2024   Acute kidney failure 01/09/2024   Acute urinary retention 01/08/2024   S/P bilateral BKA (below knee amputation) (HCC) 01/07/2024   Morbid obesity with BMI of 70 and over, adult (HCC) 11/28/2023   Aortic atherosclerosis 11/28/2023   CKD (chronic kidney disease), stage III (HCC) 11/28/2023   Weakness 04/28/2023   Nephrosis 04/27/2023   AKI (acute kidney injury) 04/26/2023   OSA (obstructive sleep apnea) 04/26/2023   Hx of iron deficiency anemia 07/07/2022   Below-knee amputation of left lower extremity (HCC)    Bipolar disorder with depression (HCC)    Dyspnea 11/18/2021   Bicuspid aortic valve 09/14/2021   Aneurysm of ascending aorta 09/14/2021   DM (diabetes mellitus) with peripheral vascular complication (HCC) 08/20/2021   Essential hypertension 07/17/2021   Cellulitis of left lower leg 04/11/2021   Acute on chronic diastolic CHF (congestive heart failure) (HCC) 07/22/2020   Acute kidney injury superimposed on stage 3b chronic kidney disease (HCC) 07/18/2020   Leukocytosis 05/31/2019    Smoker 03/08/2019   Chronic back pain 02/16/2019   BMI 70 and over, adult (HCC) 02/16/2019   Peripheral nerve disease 02/16/2019   COPD (chronic obstructive pulmonary disease) (HCC) 01/22/2019   Anxiety and depression 03/26/2018   Recurrent chest pain 07/29/2016   Hyperglycemia    Insulin  dependent type 2 diabetes mellitus (HCC) 04/29/2015   History of pulmonary embolus (PE) 04/27/2013   CAD (coronary artery disease) 09/10/2008    Orientation RESPIRATION BLADDER Height & Weight     Self, Time, Situation, Place    Continent, External catheter Weight: (!) 355 lb 6.1 oz (161.2 kg) Height:  5' 1 (154.9 cm)  BEHAVIORAL SYMPTOMS/MOOD NEUROLOGICAL BOWEL NUTRITION STATUS      Continent Diet (see DC summary)  AMBULATORY STATUS COMMUNICATION OF NEEDS Skin   Extensive Assist Verbally Other (Comment) (Wound 01/10/25 0100 Other (Comment) Knee Left; (2) Proximal, Wound 01/10/25 0100 Other (Comment) Knee Proximal;Right,)                       Personal Care Assistance Level of Assistance  Bathing, Feeding, Dressing Bathing Assistance: Maximum assistance Feeding assistance: Limited assistance Dressing Assistance: Maximum assistance     Functional Limitations Info  Sight, Hearing, Speech Sight Info: Impaired Hearing Info: Adequate Speech Info: Adequate    SPECIAL CARE FACTORS FREQUENCY  PT (By licensed PT), OT (By licensed OT)     PT Frequency: 5x/week OT Frequency: 5x/week            Contractures Contractures Info: Not present    Additional Factors Info  Code Status,  Allergies Code Status Info: FULL Allergies Info: Nsaids  Duloxetine  Sulfa  Antibiotics           Current Medications (01/14/2025):  This is the current hospital active medication list Current Facility-Administered Medications  Medication Dose Route Frequency Provider Last Rate Last Admin   acetaminophen  (TYLENOL ) tablet 650 mg  650 mg Oral Q6H PRN Autry, Lauren E, PA-C   650 mg at 01/11/25 0353    amoxicillin -clavulanate (AUGMENTIN ) 875-125 MG per tablet 1 tablet  1 tablet Oral Q12H Vann, Jessica U, DO   1 tablet at 01/14/25 9147   aspirin  EC tablet 81 mg  81 mg Oral q AM Vann, Jessica U, DO   81 mg at 01/14/25 9388   atorvastatin  (LIPITOR ) tablet 80 mg  80 mg Oral Daily Verlin Lonni BIRCH, MD   80 mg at 01/14/25 9147   buPROPion  (WELLBUTRIN  XL) 24 hr tablet 300 mg  300 mg Oral q morning Gleason, Laura R, PA-C   300 mg at 01/14/25 9147   Chlorhexidine  Gluconate Cloth 2 % PADS 6 each  6 each Topical Daily Stretch, Lamar PARAS, MD   6 each at 01/13/25 1021   clonazePAM  (KLONOPIN ) tablet 0.5 mg  0.5 mg Oral BID PRN Autry, Lauren E, PA-C   0.5 mg at 01/11/25 9380   clopidogrel  (PLAVIX ) tablet 75 mg  75 mg Oral Daily Verlin Lonni BIRCH, MD   75 mg at 01/14/25 0851   dextrose  50 % solution 0-50 mL  0-50 mL Intravenous PRN Autry, Lauren E, PA-C       feeding supplement (ENSURE PLUS HIGH PROTEIN) liquid 237 mL  237 mL Oral BID BM Vann, Jessica U, DO   237 mL at 01/13/25 0845   heparin  injection 5,000 Units  5,000 Units Subcutaneous Q8H Autry, Lauren E, PA-C   5,000 Units at 01/14/25 9388   insulin  aspart (novoLOG ) injection 0-15 Units  0-15 Units Subcutaneous TID WC Vann, Jessica U, DO   8 Units at 01/14/25 1206   insulin  aspart (novoLOG ) injection 0-5 Units  0-5 Units Subcutaneous QHS Vann, Jessica U, DO   3 Units at 01/12/25 2130   insulin  aspart (novoLOG ) injection 3 Units  3 Units Subcutaneous TID WC Vann, Jessica U, DO       insulin  glargine (LANTUS ) injection 50 Units  50 Units Subcutaneous Daily Vann, Jessica U, DO   50 Units at 01/14/25 0947   lidocaine  (LIDODERM ) 5 % 2 patch  2 patch Transdermal Q24H Vann, Jessica U, DO   2 patch at 01/14/25 0856   linezolid  (ZYVOX ) tablet 600 mg  600 mg Oral Q12H Vann, Jessica U, DO   600 mg at 01/14/25 9147   mupirocin  ointment (BACTROBAN ) 2 % 1 Application  1 Application Nasal BID Vann, Jessica U, DO   1 Application at 01/14/25 9143   Oral care  mouth rinse  15 mL Mouth Rinse PRN Stretch, Lamar PARAS, MD       oxyCODONE  (Oxy IR/ROXICODONE ) immediate release tablet 10-15 mg  10-15 mg Oral Q4H PRN Vann, Jessica U, DO   15 mg at 01/14/25 0852   polyethylene glycol (MIRALAX  / GLYCOLAX ) packet 17 g  17 g Oral Daily PRN Autry, Lauren E, PA-C       senna (SENOKOT) tablet 8.6 mg  1 tablet Oral BID PRN Autry, Lauren E, PA-C       sodium chloride  flush (NS) 0.9 % injection 3 mL  3 mL Intravenous Q12H Verlin Lonni BIRCH, MD   3  mL at 01/14/25 0857   sodium chloride  flush (NS) 0.9 % injection 3 mL  3 mL Intravenous PRN Verlin Lonni BIRCH, MD         Discharge Medications: Please see discharge summary for a list of discharge medications.  Relevant Imaging Results:  Relevant Lab Results:   Additional Information [id number]  Terald Jump J Latesa Fratto, LCSWA     "

## 2025-01-15 DIAGNOSIS — R079 Chest pain, unspecified: Secondary | ICD-10-CM | POA: Diagnosis not present

## 2025-01-15 DIAGNOSIS — L03119 Cellulitis of unspecified part of limb: Secondary | ICD-10-CM

## 2025-01-15 LAB — BASIC METABOLIC PANEL WITH GFR
Anion gap: 14 (ref 5–15)
BUN: 62 mg/dL — ABNORMAL HIGH (ref 6–20)
CO2: 23 mmol/L (ref 22–32)
Calcium: 9.1 mg/dL (ref 8.9–10.3)
Chloride: 98 mmol/L (ref 98–111)
Creatinine, Ser: 2.61 mg/dL — ABNORMAL HIGH (ref 0.61–1.24)
GFR, Estimated: 29 mL/min — ABNORMAL LOW
Glucose, Bld: 262 mg/dL — ABNORMAL HIGH (ref 70–99)
Potassium: 4.6 mmol/L (ref 3.5–5.1)
Sodium: 134 mmol/L — ABNORMAL LOW (ref 135–145)

## 2025-01-15 LAB — CBC
HCT: 31.6 % — ABNORMAL LOW (ref 39.0–52.0)
Hemoglobin: 9.9 g/dL — ABNORMAL LOW (ref 13.0–17.0)
MCH: 26.5 pg (ref 26.0–34.0)
MCHC: 31.3 g/dL (ref 30.0–36.0)
MCV: 84.5 fL (ref 80.0–100.0)
Platelets: 512 10*3/uL — ABNORMAL HIGH (ref 150–400)
RBC: 3.74 MIL/uL — ABNORMAL LOW (ref 4.22–5.81)
RDW: 15.1 % (ref 11.5–15.5)
WBC: 13.2 10*3/uL — ABNORMAL HIGH (ref 4.0–10.5)
nRBC: 0 % (ref 0.0–0.2)

## 2025-01-15 LAB — GLUCOSE, CAPILLARY
Glucose-Capillary: 238 mg/dL — ABNORMAL HIGH (ref 70–99)
Glucose-Capillary: 229 mg/dL — ABNORMAL HIGH (ref 70–99)
Glucose-Capillary: 283 mg/dL — ABNORMAL HIGH (ref 70–99)
Glucose-Capillary: 259 mg/dL — ABNORMAL HIGH (ref 70–99)

## 2025-01-15 MED ORDER — ONDANSETRON HCL 4 MG/2ML IJ SOLN: 4.0000 mg | Freq: Four times a day (QID) | INTRAMUSCULAR | Status: DC | PRN

## 2025-01-15 MED ORDER — INSULIN GLARGINE-YFGN 100 UNIT/ML ~~LOC~~ SOLN
25.0000 [IU] | Freq: Every day | SUBCUTANEOUS | Status: DC
  Administered 2025-01-15 – 2025-01-16 (×2): 25 [IU] via SUBCUTANEOUS
  Filled 2025-01-15 (×3): qty 0.25

## 2025-01-15 MED ORDER — INSULIN ASPART 100 UNIT/ML IJ SOLN
5.0000 [IU] | Freq: Three times a day (TID) | INTRAMUSCULAR | Status: DC
  Administered 2025-01-16 – 2025-01-18 (×5): 5 [IU] via SUBCUTANEOUS
  Filled 2025-01-15 (×2): qty 1

## 2025-01-15 MED ORDER — ONDANSETRON HCL 4 MG PO TABS
4.0000 mg | ORAL_TABLET | Freq: Three times a day (TID) | ORAL | Status: AC | PRN
  Administered 2025-01-15 – 2025-01-25 (×2): 4 mg via ORAL
  Filled 2025-01-15 (×2): qty 1

## 2025-01-15 NOTE — Progress Notes (Signed)
 Physical Therapy Treatment Patient Details Name: Joseph [name] MRN: [id number] DOB: 1970-12-28 Today's Date: 01/15/2025   History of Present Illness Pt is a 54 y.o. male who presented 01/09/25 with chest pain. EKG showed ST changes in the anterior leads so he was taken to the cath lab, cath showed no acute lesion. Admitted with acute metabolic encephalopathy, hyperglycemia, hyperkalemia, hyponatremia, AKI on CKD, SIRS. PMH: asthma, COPD, chronic bronchitis, morbid obesity, OSA, diabetes, GERD, HTN, HLD, PE, DVT on Xarelto , PAD s/p bilateral BKA, CAD s/p CABG x4, aneurysm of ascending aorta, arthritis, DM2, MI, PE    PT Comments  Pt continues to be very hypersensitive to touch of R shoulder and is not moving shoulder or elbow, he relays that is feels like there is a vise grip within his shoulder. Worked on transitional movements in bed and scooting in bed. Pt able to scoot with momentum but uses RUE minimally and LUE not at all. He reports that he wants to go home and thinks he can still crawl up the stairs but has no solid answer how he will do that without use of LUE. Had white, thick drainage oozing from LLE wound with bad odor. Redressed and notified RN. Patient will benefit from continued inpatient follow up therapy, <3 hours/day. PT will continue to follow.     If plan is discharge home, recommend the following: Two people to help with walking and/or transfers;Two people to help with bathing/dressing/bathroom;Assistance with cooking/housework;Assistance with feeding;Direct supervision/assist for medications management;Direct supervision/assist for financial management;Assist for transportation;Help with stairs or ramp for entrance;Supervision due to cognitive status   Can travel by private vehicle     No  Equipment Recommendations  Wheelchair cushion (measurements PT);Hospital bed;Hoyer lift (roho cushion, air mattress overlay, bil BKA hoyer lift pad)    Recommendations for Other Services        Precautions / Restrictions Precautions Precautions: Fall;Other (comment) Precaution/Restrictions Comments: hypersensitive to all touch, esp L shoulder; chronic bil BKA; wounds reported on buttocks and noted at BKA Restrictions Weight Bearing Restrictions Per Provider Order: No     Mobility  Bed Mobility Overal bed mobility: Needs Assistance Bed Mobility: Rolling, Supine to Sit, Sit to Supine Rolling: Max assist   Supine to sit: Mod assist, +2 for physical assistance Sit to supine: Min assist   General bed mobility comments: pt needed mod A at hips and mid back (cannot tolerate touch to L shoulder) to come to sitting EOB. Was able to return to supine with min A for safety    Transfers                   General transfer comment: worked on scooting within bed. Pt scooted from sitting on R side of bed to sitting on L side of bed for sheets to be changed. Min A for safety. He was also able to scoot bkwds and fwds a few inches and wt shift side to side. Pt does this largely with momentum and with minimal use of RUE, no use of LUE    Ambulation/Gait               General Gait Details: n/a   Stairs             Wheelchair Mobility     Tilt Bed    Modified Rankin (Stroke Patients Only)       Balance Overall balance assessment: Needs assistance Sitting-balance support: Feet unsupported, No upper extremity supported Sitting balance-Leahy Scale: Good Sitting balance -  Comments: CGA for safety with scooting in sitting                                    Communication Communication Communication: No apparent difficulties  Cognition Arousal: Alert Behavior During Therapy: Restless   PT - Cognitive impairments: History of cognitive impairments                       PT - Cognition Comments: pt following commands today and attending throughout session. Though does not seem to understand why going home will be more difficult  when he is not using his LUE at all and has been crawling up the stairs with both arms Following commands: Impaired Following commands impaired: Follows one step commands with increased time    Cueing Cueing Techniques: Verbal cues, Tactile cues, Gestural cues  Exercises Amputee Exercises Knee Flexion: AROM, Right, 10 reps, Seated Knee Extension: AROM, Right, 10 reps, Seated    General Comments General comments (skin integrity, edema, etc.): SPO2 91% on RA, HR 93 bpm      Pertinent Vitals/Pain Pain Assessment Pain Assessment: Faces Faces Pain Scale: Hurts whole lot Breathing: normal Pain Location: L shoulder Pain Descriptors / Indicators: Discomfort, Grimacing, Guarding, Moaning Pain Intervention(s): Limited activity within patient's tolerance, Monitored during session, Premedicated before session    Home Living                          Prior Function            PT Goals (current goals can now be found in the care plan section) Acute Rehab PT Goals Patient Stated Goal: to reduce pain PT Goal Formulation: With patient/family Time For Goal Achievement: 01/26/25 Potential to Achieve Goals: Fair Progress towards PT goals: Progressing toward goals    Frequency    Min 2X/week      PT Plan      Co-evaluation              AM-PAC PT 6 Clicks Mobility   Outcome Measure  Help needed turning from your back to your side while in a flat bed without using bedrails?: A Lot Help needed moving from lying on your back to sitting on the side of a flat bed without using bedrails?: A Lot Help needed moving to and from a bed to a chair (including a wheelchair)?: Total Help needed standing up from a chair using your arms (e.g., wheelchair or bedside chair)?: Total Help needed to walk in hospital room?: Total Help needed climbing 3-5 steps with a railing? : Total 6 Click Score: 8    End of Session   Activity Tolerance: Patient limited by pain Patient left: in  bed;with call bell/phone within reach;with bed alarm set Nurse Communication: Mobility status PT Visit Diagnosis: Muscle weakness (generalized) (M62.81);Difficulty in walking, not elsewhere classified (R26.2);Pain;History of falling (Z91.81)     Time: 1230-1310 PT Time Calculation (min) (ACUTE ONLY): 40 min  Charges:    $Therapeutic Exercise: 8-22 mins $Therapeutic Activity: 8-22 mins $Neuromuscular Re-education: 8-22 mins PT General Charges $$ ACUTE PT VISIT: 1 Visit                     Richerd Lipoma, PT  Acute Rehab Services Secure chat preferred Office [phone]    Richerd CROME Kewanna Kasprzak 01/15/2025, 2:11 PM

## 2025-01-15 NOTE — TOC Progression Note (Signed)
 Transition of Care [facility]) - Progression Note    Patient Details  Name: OGLE HOEFFNER MRN: [id number] Date of Birth: 06/05/1971  [facility] of [facility]) CM/SW Contact  [name] Phone Number: [phone] 01/15/2025, 10:44 AM  Clinical Narrative:   Patient current accepted bed offers:   Skilled Nursing Rehab Facilities-   [email or web address]     Ratings out of 5 stars (5 the highest)     Name Address  Phone # Quality Care Staffing Health Inspection Overall   [address] ([place]) [address], [place] [phone] 1 3 3 2    [facility] ([place] [address], [place] [phone] 2 1 1 1    [facility]** [phone], [place] [phone] 2 1 1 1        Expected Discharge Plan: Home/Self Care Barriers to Discharge: Continued Medical Work up               Expected Discharge Plan and Services       Living arrangements for the past 2 months: Single Family Home                                       Social Drivers of Health (SDOH) Interventions SDOH Screenings   Food Insecurity: No Food Insecurity (01/11/2025)  Housing: Low Risk (01/11/2025)  Transportation Needs: Unmet Transportation Needs (01/11/2025)  Utilities: Not At Risk (01/11/2025)  Financial Resource Strain: Medium Risk (09/09/2022)  Social Connections: Moderately Isolated (03/28/2024)  Stress: No Stress Concern Present (10/10/2023)   Received from [facility]  Tobacco Use: Medium Risk (01/12/2025)    Readmission Risk Interventions    05/03/2023   11:58 AM  Readmission Risk Prevention Plan  Transportation Screening Complete  PCP or Specialist Appt within 3-5 Days Complete  HRI or Home Care Consult Complete  Social Work Consult for Recovery Care Planning/Counseling Complete  Palliative Care Screening Not Applicable  Medication Review Oceanographer) Referral to Pharmacy

## 2025-01-15 NOTE — Plan of Care (Signed)
" °  Problem: Education: Goal: Knowledge of General Education information will improve Description: Including pain rating scale, medication(s)/side effects and non-pharmacologic comfort measures Outcome: Progressing   Problem: Clinical Measurements: Goal: Ability to maintain clinical measurements within normal limits will improve Outcome: Progressing Goal: Diagnostic test results will improve Outcome: Progressing Goal: Respiratory complications will improve Outcome: Progressing Goal: Cardiovascular complication will be avoided Outcome: Progressing   Problem: Nutrition: Goal: Adequate nutrition will be maintained Outcome: Progressing   Problem: Elimination: Goal: Will not experience complications related to bowel motility Outcome: Progressing Goal: Will not experience complications related to urinary retention Outcome: Progressing   Problem: Pain Managment: Goal: General experience of comfort will improve and/or be controlled Outcome: Progressing   Problem: Safety: Goal: Ability to remain free from injury will improve Outcome: Progressing   Problem: Activity: Goal: Ability to return to baseline activity level will improve Outcome: Progressing   Problem: Cardiovascular: Goal: Ability to achieve and maintain adequate cardiovascular perfusion will improve Outcome: Progressing Goal: Vascular access site(s) Level 0-1 will be maintained Outcome: Progressing   Problem: Coping: Goal: Ability to adjust to condition or change in health will improve Outcome: Progressing   Problem: Fluid Volume: Goal: Ability to maintain a balanced intake and output will improve Outcome: Progressing   Problem: Metabolic: Goal: Ability to maintain appropriate glucose levels will improve Outcome: Progressing   Problem: Nutritional: Goal: Maintenance of adequate nutrition will improve Outcome: Progressing   Problem: Tissue Perfusion: Goal: Adequacy of tissue perfusion will improve Outcome:  Progressing   Problem: Cardiac: Goal: Ability to maintain an adequate cardiac output will improve Outcome: Progressing   Problem: Fluid Volume: Goal: Ability to achieve a balanced intake and output will improve Outcome: Progressing   Problem: Metabolic: Goal: Ability to maintain appropriate glucose levels will improve Outcome: Progressing   Problem: Respiratory: Goal: Will regain and/or maintain adequate ventilation Outcome: Progressing   Problem: Urinary Elimination: Goal: Ability to achieve and maintain adequate renal perfusion and functioning will improve Outcome: Progressing   "

## 2025-01-15 NOTE — Progress Notes (Addendum)
 " PROGRESS NOTE    Joseph [name]  FMW:[id number] DOB: 1971-02-03 DOA: 01/09/2025 PCP: Alec House, MD    Brief Narrative:  54 year old male with past medical history of asthma, COPD, chronic bronchitis, morbid obesity, OSA, diabetes, GERD, HTN, HLD, PE, DVT on Xarelto , PAD s/p bilateral BKA, CAD s/p CABG x4 who initially presented to the emergency department with chest pain.  He was initially in the ICU but has since been transferred out. Patient with multiple complaints of pain in different areas.  This has improved with resumption of his home medications including Lyrica . Eventually will need skilled nursing facility  Assessment and Plan: Acute metabolic encephalopathy  Likely mixed uremia, CO2 narcosis, DKA. Also polypharmacy, on xanaflex, lyrica , oxycodone , wellbutrin , xanax   -improving with treating metabolic derangements and infection   Multiple complaints of pain -Complained of bilateral numbness on 1/25-CT scan of cervical spine unrevealing - On 1/26 complained of cold left hand-ultrasound unrevealing, currently pain-free  Hyperkalemia /Hyponatremia  - Was on insulin  drip but has been changed to subcu insulin -titrate up -lokelma  with resolution of hyperkalemia   Diabetes - Titration of Lantus  for better coverage -Sliding scale insulin    AKI on CKD /AGMA-stage IIIb Possibly dehydration/pre-renal AKI on CKD  - UA bland, nephro consult appreciated -strict I/Os -cath 1/21--? Worsened from contrast - DC Foley - Nephrology signed off as patient appears to be back at baseline-Will need follow-up with Dr. Macel in 2 to 3 weeks   SIRS, query infection from his right leg/stump cellulitis - obtain UA, CXR,covid/flu/rsv, 20 pathogen panel -- negative thus far - IV antibiotics-changed to p.o. to finish course -Asked nurse when patient has wound care to place picture on the chart - X-ray with subcu edema  - Blood cultures positive but deemed to be contaminant by pharmacy-Will  continue to monitor  COPD  Chronic bronchitis Asthma  OSA on CPAP  - Has been weaned off O2    CAD s/p CABG x4 Left shoulder pain  Concern this was anginal pain, cath unrevealing for chest pain. Doubt PE as no hypoxia  Most recent echo 03/2024 with EF 55-60%,, RV normal -echo - cardiology signed off - cath unrevealing - asa/plavix  cont    History of PE, DVT  - not on home DOAC  -PE was remote in 2014   Depression  Anxiety   - continue wellbutrin  and lexapro     Chronica pain - Begin to resume home medications  Obesity Estimated body mass index is 67.07 kg/m as calculated from the following:   Height as of this encounter: 5' 1 (1.549 m).   Weight as of this encounter: 161 kg.   PT eval recommending skilled nursing facility-in progress  DVT prophylaxis: heparin  injection 5,000 Units Start: 01/09/25 2200    Code Status: Full Code   Disposition Plan:  Level of care: Progressive Status is: Inpatient     Consultants:  PCCM cards   Subjective: With resumption of Lyrica  yesterday, somewhat better with pain overall but still complaining of weakness in his shoulders  Objective: Vitals:   01/14/25 2317 01/15/25 0400 01/15/25 0401 01/15/25 0842  BP: (!) 124/90 121/88  123/83  Pulse: 91 88  92  Resp: 14 17  18   Temp: 98.4 F (36.9 C) 98.1 F (36.7 C)  98.6 F (37 C)  TempSrc: Oral Oral  Oral  SpO2: 98% 96%  100%  Weight:   (!) 161 kg   Height:        Intake/Output Summary (Last 24 hours)  at 01/15/2025 1108 Last data filed at 01/15/2025 0450 Gross per 24 hour  Intake --  Output 450 ml  Net -450 ml   Filed Weights   01/13/25 0346 01/14/25 0313 01/15/25 0401  Weight: (!) 168.2 kg (!) 161.2 kg (!) 161 kg    Examination:  General: Appearance:    Severely obese male in no acute distress       Lungs:      respirations unlabored  Heart:    Normal heart rate. Normal rhythm. No murmurs, rubs, or gallops.   MS: Bilateral amputations, redness improved in  right stump, less swelling as well  Neurologic:   Awake, alert, more insight today      Data Reviewed: I have personally reviewed following labs and imaging studies  CBC: Recent Labs  Lab 01/09/25 1920 01/09/25 1930 01/11/25 0247 01/12/25 0323 01/13/25 0313 01/14/25 0139 01/15/25 0333  WBC 14.0*   < > 13.3* 12.2* 11.5* 12.5* 13.2*  NEUTROABS 11.8*  --   --   --   --   --   --   HGB 10.0*   < > 9.8* 8.9* 9.2* 10.7* 9.9*  HCT 31.2*   < > 29.7* 27.7* 28.8* 33.8* 31.6*  MCV 84.1   < > 83.7 83.2 83.5 85.4 84.5  PLT 299   < > 324 400 407* 465* 512*   < > = values in this interval not displayed.   Basic Metabolic Panel: Recent Labs  Lab 01/09/25 2240 01/09/25 2249 01/11/25 0247 01/12/25 0323 01/13/25 0313 01/14/25 0139 01/15/25 0333  NA 128*   < > 136 137 136 137 134*  K 6.6*   < > 5.3* 4.9 5.0 5.8* 4.6  CL 95*   < > 99 98 100 101 98  CO2 14*   < > 23 22 24  20* 23  GLUCOSE 391*   < > 185* 258* 223* 229* 262*  BUN 91*   < > 81* 79* 69* 61* 62*  CREATININE 4.62*   < > 3.86* 3.67* 3.05* 2.76* 2.61*  CALCIUM  8.7*   < > 8.6* 8.8* 8.9 9.2 9.1  MG 1.8  --  1.8  --   --   --   --   PHOS 4.2  --   --   --   --   --   --    < > = values in this interval not displayed.   GFR: Estimated Creatinine Clearance: 45.4 mL/min (A) (by C-G formula based on SCr of 2.61 mg/dL (H)). Liver Function Tests: Recent Labs  Lab 01/09/25 1920  AST 21  ALT 23  ALKPHOS 134*  BILITOT 0.7  PROT 7.4  ALBUMIN  3.0*   Recent Labs  Lab 01/09/25 2240  LIPASE 19   Recent Labs  Lab 01/09/25 2240  AMMONIA 17   Coagulation Profile: Recent Labs  Lab 01/09/25 1920  INR 1.4*   Cardiac Enzymes: No results for input(s): CKTOTAL, CKMB, CKMBINDEX, TROPONINI in the last 168 hours. BNP (last 3 results) Recent Labs    04/27/24 1051 12/17/24 1056 01/10/25 1007  PROBNP 895* 285* 576.0*   HbA1C: No results for input(s): HGBA1C in the last 72 hours.  CBG: Recent Labs  Lab  01/14/25 0619 01/14/25 1136 01/14/25 1715 01/14/25 2043 01/15/25 0557  GLUCAP 223* 288* 338* 290* 238*   Lipid Profile: No results for input(s): CHOL, HDL, LDLCALC, TRIG, CHOLHDL, LDLDIRECT in the last 72 hours.  Thyroid Function Tests: No results for input(s): TSH, T4TOTAL, FREET4,  T3FREE, THYROIDAB in the last 72 hours. Anemia Panel: No results for input(s): VITAMINB12, FOLATE, FERRITIN, TIBC, IRON, RETICCTPCT in the last 72 hours. Sepsis Labs: Recent Labs  Lab 01/09/25 1930 01/09/25 2240  LATICACIDVEN 0.8 1.9    Recent Results (from the past 240 hours)  MRSA Next Gen by PCR, Nasal     Status: Abnormal   Collection Time: 01/09/25  8:24 PM   Specimen: Nasal Mucosa; Nasal Swab  Result Value Ref Range Status   MRSA by PCR Next Gen DETECTED (A) NOT DETECTED Final    Comment: RESULT CALLED TO, READ BACK BY AND VERIFIED WITH: J CRUZ REYES RN 01/09/2025 @ 2250 BY AB (NOTE) The GeneXpert MRSA Assay (FDA approved for NASAL specimens only), is one component of a comprehensive MRSA colonization surveillance program. It is not intended to diagnose MRSA infection nor to guide or monitor treatment for MRSA infections. Test performance is not FDA approved in patients less than 2 years old. Performed at [facility], [address], [place], [name] [address]   Culture, blood (Routine X 2) w Reflex to ID Panel     Status: Abnormal   Collection Time: 01/09/25  8:43 PM   Specimen: BLOOD  Result Value Ref Range Status   Specimen Description BLOOD SITE NOT SPECIFIED  Final   Special Requests   Final    BOTTLES DRAWN AEROBIC AND ANAEROBIC Blood Culture results may not be optimal due to an inadequate volume of blood received in culture bottles   Culture  Setup Time   Final    GRAM POSITIVE RODS AEROBIC BOTTLE ONLY CRITICAL RESULT CALLED TO, READ BACK BY AND VERIFIED WITH: PHARMD A. PAYTES [id number] @ 2033 FH    Culture (A)  Final     CORYNEBACTERIUM MINUTISSIMUM Standardized susceptibility testing for this organism is not available. Performed at [facility], [address] [place], [name] [address]    Report Status 01/14/2025 FINAL  Final  Resp panel by RT-PCR (RSV, Flu A&B, Covid) Anterior Nasal Swab     Status: None   Collection Time: 01/09/25  8:46 PM   Specimen: Anterior Nasal Swab  Result Value Ref Range Status   SARS Coronavirus 2 by RT PCR NEGATIVE NEGATIVE Final   Influenza A by PCR NEGATIVE NEGATIVE Final   Influenza B by PCR NEGATIVE NEGATIVE Final    Comment: (NOTE) The Xpert Xpress SARS-CoV-2/FLU/RSV plus assay is intended as an aid in the diagnosis of influenza from Nasopharyngeal swab specimens and should not be used as a sole basis for treatment. Nasal washings and aspirates are unacceptable for Xpert Xpress SARS-CoV-2/FLU/RSV testing.  Fact Sheet for Patients: bloggercourse.com  Fact Sheet for Healthcare Providers: seriousbroker.it  This test is not yet approved or cleared by the [place]  FDA and has been authorized for detection and/or diagnosis of SARS-CoV-2 by FDA under an Emergency Use Authorization (EUA). This EUA will remain in effect (meaning this test can be used) for the duration of the COVID-19 declaration under Section 564(b)(1) of the Act, 21 U.S.C. section 360bbb-3(b)(1), unless the authorization is terminated or revoked.     Resp Syncytial Virus by PCR NEGATIVE NEGATIVE Final    Comment: (NOTE) Fact Sheet for Patients: bloggercourse.com  Fact Sheet for Healthcare Providers: seriousbroker.it  This test is not yet approved or cleared by the [place]  FDA and has been authorized for detection and/or diagnosis of SARS-CoV-2 by FDA under an Emergency Use Authorization (EUA). This EUA will remain in effect (  meaning this test can be used) for the duration of  the COVID-19 declaration under Section 564(b)(1) of the Act, 21 U.S.C. section 360bbb-3(b)(1), unless the authorization is terminated or revoked.  Performed at [facility], [address], [place], [name] [address]   Respiratory (~20 pathogens) panel by PCR     Status: None   Collection Time: 01/09/25  8:46 PM   Specimen: Nasopharyngeal Swab; Respiratory  Result Value Ref Range Status   Adenovirus NOT DETECTED NOT DETECTED Final   Coronavirus 229E NOT DETECTED NOT DETECTED Final    Comment: (NOTE) The Coronavirus on the Respiratory Panel, DOES NOT test for the novel  Coronavirus (2019 nCoV)    Coronavirus HKU1 NOT DETECTED NOT DETECTED Final   Coronavirus NL63 NOT DETECTED NOT DETECTED Final   Coronavirus OC43 NOT DETECTED NOT DETECTED Final   Metapneumovirus NOT DETECTED NOT DETECTED Final   Rhinovirus / Enterovirus NOT DETECTED NOT DETECTED Final   Influenza A NOT DETECTED NOT DETECTED Final   Influenza B NOT DETECTED NOT DETECTED Final   Parainfluenza Virus 1 NOT DETECTED NOT DETECTED Final   Parainfluenza Virus 2 NOT DETECTED NOT DETECTED Final   Parainfluenza Virus 3 NOT DETECTED NOT DETECTED Final   Parainfluenza Virus 4 NOT DETECTED NOT DETECTED Final   Respiratory Syncytial Virus NOT DETECTED NOT DETECTED Final   Bordetella pertussis NOT DETECTED NOT DETECTED Final   Bordetella Parapertussis NOT DETECTED NOT DETECTED Final   Chlamydophila pneumoniae NOT DETECTED NOT DETECTED Final   Mycoplasma pneumoniae NOT DETECTED NOT DETECTED Final    Comment: Performed at [facility], [address], [place], [name] [address]  Culture, blood (Routine X 2) w Reflex to ID Panel     Status: None   Collection Time: 01/09/25 10:40 PM   Specimen: BLOOD  Result Value Ref Range Status   Specimen Description BLOOD SITE NOT SPECIFIED  Final   Special Requests   Final    BOTTLES DRAWN AEROBIC ONLY Blood Culture results may not be optimal due to an inadequate volume of blood  received in culture bottles   Culture   Final    NO GROWTH 5 DAYS Performed at [facility], [address] [place], [name] [address]    Report Status 01/14/2025 FINAL  Final         Radiology Studies: VAS US  UPPER EXTREMITY ARTERIAL DUPLEX Result Date: 01/14/2025  UPPER EXTREMITY DUPLEX STUDY Patient Name:  Joseph [name] [facility]  Date of Exam:   01/14/2025 Medical Rec #: [id number]        Accession #:    [id number] Date of Birth: Mar 20, 1971        Patient Gender: M Patient Age:   54 years Exam Location:  [facility] Procedure:      VAS US  UPPER EXTREMITY ARTERIAL DUPLEX Referring Phys: Mahonri Seiden --------------------------------------------------------------------------------  Indications: Hand and shoulder pain. History:     Patient has a history of catheterization via left radial artery and              Radial cath 01/09/25.  Risk Factors:  Hypertension, hyperlipidemia, Diabetes, past history of smoking,                coronary artery disease. Other Factors: Chronic bilateral shoulder pain, bilateral BKA Limitations: Patient movement, inability to move arm away from body, multiple              interruptions secondary to chest and bilateral shoulder pain  Comparison Study: No prior study on file Performing Technologist: Alberta Lis RVS  Examination Guidelines: A complete evaluation includes B-mode imaging, spectral Doppler, color Doppler, and power Doppler as needed of all accessible portions of each vessel. Bilateral testing is considered an integral part of a complete examination. Limited examinations for reoccurring indications may be performed as noted.  Left Doppler Findings: +---------------+----------+-----------+--------+------------------+ Site           PSV (cm/s)Waveform   StenosisComments           +---------------+----------+-----------+--------+------------------+ Subclavian Mid 106       multiphasic                            +---------------+----------+-----------+--------+------------------+ Subclavian Dist104       mulitphasic                           +---------------+----------+-----------+--------+------------------+ Axillary       89        triphasic                             +---------------+----------+-----------+--------+------------------+ Brachial Prox  114       triphasic                             +---------------+----------+-----------+--------+------------------+ Brachial Mid   120       triphasic                             +---------------+----------+-----------+--------+------------------+ Brachial Dist  107       triphasic                             +---------------+----------+-----------+--------+------------------+ Radial Prox    93        triphasic                             +---------------+----------+-----------+--------+------------------+ Radial Mid     76        triphasic                             +---------------+----------+-----------+--------+------------------+ Radial Dist    70        triphasic                             +---------------+----------+-----------+--------+------------------+ Ulnar Prox                                  Unable to insonate +---------------+----------+-----------+--------+------------------+ Ulnar Mid                                   Unable to insonate +---------------+----------+-----------+--------+------------------+ Ulnar Dist                                  Unable to insonate +---------------+----------+-----------+--------+------------------+ Palmar Arch    60        triphasic  Snuff box          +---------------+----------+-----------+--------+------------------+   Summary:  Left: No obstruction visualized in the left upper extremity. Ulnar       Artery unable to insonate due to patient movement *See table(s) above for measurements and observations. Electronically signed by Fonda Rim on 01/14/2025 at 2:36:59 PM.    Final    CT CERVICAL SPINE WO CONTRAST Result Date: 01/13/2025 EXAM: CT CERVICAL SPINE WITHOUT CONTRAST 01/13/2025 03:39:02 PM TECHNIQUE: CT of the cervical spine was performed without the administration of intravenous contrast. Multiplanar reformatted images are provided for review. Automated exposure control, iterative reconstruction, and/or weight based adjustment of the mA/kV was utilized to reduce the radiation dose to as low as reasonably achievable. COMPARISON: CT Cervical Spine without IV contrast 10/01/2023. CLINICAL HISTORY: Cervical radiculopathy, no red flags. Cervical radiculopathy. FINDINGS: BONES AND ALIGNMENT: No acute fracture or traumatic malalignment. DEGENERATIVE CHANGES: Intervertebral disc spaces are relatively maintained. At C2-C3 there is a small disc bulge. No significant osseous spinal canal stenosis. No significant foraminal stenosis. At C3-C4 there is a small disc bulge and thickening of the ligamentum flavum. Mild spinal canal stenosis. Mild uncovertebral hypertrophy on the left. There is mild left foraminal stenosis. At C4-C5 there is a small disc bulge and thickening of the ligamentum flavum. Mild spinal canal stenosis. No significant foraminal stenosis. At C5-C6 there is a small disc bulge. No significant spinal canal stenosis. No significant foraminal stenosis. At C6-C7 there is a small disc bulge and thickening of the ligamentum flavum. Suspect at least mild spinal canal stenosis at this level. No significant foraminal stenosis. At C7-T1 there is artifact through the spinal canal. No high grade spinal canal stenosis appreciated. Uncinate process hypertrophy on the right and mild facet arthrosis bilaterally. There is mild right foraminal stenosis. SOFT TISSUES: No prevertebral soft tissue swelling. Multiple periapical lucencies. Dental caries. VASCULATURE: Mild atherosclerosis at the carotid bifurcations. IMPRESSION: 1. Degenerative changes as  above. There is mild spinal canal stenosis at multiple levels. Mild foraminal stenosis on the left at C3-4 and on the right at C7-T1. 2. Multiple periapical lucencies and dental caries. Electronically signed by: Donnice Mania MD 01/13/2025 08:23 PM EST RP Workstation: [id number]        Scheduled Meds:  amoxicillin -clavulanate  1 tablet Oral Q12H   aspirin  EC  81 mg Oral q AM   atorvastatin   80 mg Oral Daily   buPROPion   300 mg Oral q morning   Chlorhexidine  Gluconate Cloth  6 each Topical Daily   clopidogrel   75 mg Oral Daily   feeding supplement  237 mL Oral BID BM   heparin   5,000 Units Subcutaneous Q8H   insulin  aspart  0-15 Units Subcutaneous TID WC   insulin  aspart  0-5 Units Subcutaneous QHS   insulin  aspart  5 Units Subcutaneous TID WC   insulin  glargine  50 Units Subcutaneous Daily   insulin  glargine-yfgn  25 Units Subcutaneous QHS   lidocaine   2 patch Transdermal Q24H   linezolid   600 mg Oral Q12H   mupirocin  ointment  1 Application Nasal BID   pregabalin   150 mg Oral BID   sodium chloride  flush  3 mL Intravenous Q12H   Continuous Infusions:     LOS: 6 days    Time spent: 35 minutes spent on chart review, discussion with nursing staff, consultants, updating family and interview/physical exam; more than 50% of that time was spent in counseling and/or coordination of care.    Harlene PENNER  Joseph Thiam, DO [facility] Available via Epic secure chat 7am-7pm After these hours, please refer to coverage provider listed on [email or web address] 01/15/2025, 11:08 AM   "

## 2025-01-15 NOTE — Progress Notes (Unsigned)
 " Cardiology Office Note:    Date:  01/15/2025   ID:  Joseph [name], DOB August 17, 1971, MRN [id number]  PCP:  Alec House, MD   [facility] Providers Cardiologist:  Oneil Parchment, MD { Click to update primary MD,subspecialty MD or APP then REFRESH:1}    Referring MD: Alec House, MD   Chief complaint: ***     History of Present Illness:   Joseph [name] is a 54 y.o. male with a hx of ***  ROS:   Please see the history of present illness.    *** All other systems reviewed and are negative.     Past Medical History:  Diagnosis Date   Aneurysm of ascending aorta 09/14/2021   Anxiety    Aortic atherosclerosis 11/28/2023   Arthritis    knees, shoulders, hips, ankles (07/29/2016)   Asthma    Bicuspid aortic valve 09/14/2021   BMI 70 and over, adult (HCC) 02/16/2019   CAD (coronary artery disease)    a. 2017: s/p BMS to distal Cx; b. LHC 05/30/2019: 80% mid, distal RCA s/p DES, 30% narrowing of d LM, widely patent LAD w/ luminal irregularities, widely patent stent in dCX  w/ 90+% stenosis distal to stent beofre small trifurcating obtuse marginal (potentially area of restenosis)   Chronic bronchitis (HCC)    Chronic ulcer of great toe of right foot (HCC) 09/09/2017   Chronic ulcer of right great toe (HCC) 09/19/2017   COPD (chronic obstructive pulmonary disease) (HCC)    Coronary arteriosclerosis    Deep venous thrombosis (HCC) 02/16/2019   Depression    Diabetes mellitus (HCC)    DVT (deep venous thrombosis) (HCC)    GERD (gastroesophageal reflux disease)    Hyperlipidemia    Hypertension    Migraine    Morbid obesity (HCC)    Morbid obesity with BMI of 70 and over, adult (HCC) 11/28/2023   Myocardial infarction (HCC) 1996   light one   Non-ST elevation (NSTEMI) myocardial infarction (HCC) 07/22/2020   PE (pulmonary embolism) 04/2013   On chronic Xarelto    Peripheral nerve disease    Pneumonia several times   Sleep apnea    Type II diabetes mellitus  (HCC)     Past Surgical History:  Procedure Laterality Date   ABDOMINAL AORTOGRAM W/LOWER EXTREMITY N/A 10/10/2020   Procedure: ABDOMINAL AORTOGRAM W/LOWER EXTREMITY;  Surgeon: Harvey Carlin BRAVO, MD;  [facility]: [facility];  Service: Cardiovascular;  Laterality: N/A;   AMPUTATION Right 09/21/2017   Procedure: RIGHT GREAT TOE AMPUTATION, POSSIBLE VAC;  Surgeon: Jerri Kay HERO, MD;  Location: [facility] OR;  Service: Orthopedics;  Laterality: Right;   AMPUTATION Left 08/13/2020   Procedure: LEFT FOOT 4TH RAY AMPUTATION;  Surgeon: Harden Jerona GAILS, MD;  Location: [facility] OR;  Service: Orthopedics;  Laterality: Left;   AMPUTATION Left 11/20/2021   Procedure: LEFT TRANSMETATARSAL AMPUTATION;  Surgeon: Harden Jerona GAILS, MD;  Location: [facility] OR;  Service: Orthopedics;  Laterality: Left;   AMPUTATION Left 01/08/2022   Procedure: LEFT BELOW KNEE AMPUTATION;  Surgeon: Harden Jerona GAILS, MD;  Location: [facility] OR;  Service: Orthopedics;  Laterality: Left;   AMPUTATION Right 07/07/2022   Procedure: RIGHT BELOW KNEE AMPUTATION;  Surgeon: Harden Jerona GAILS, MD;  Location: [facility] OR;  Service: Orthopedics;  Laterality: Right;   AORTOGRAM Bilateral 03/13/2021   Procedure: ABDOMINAL AORTOGRAM WITH Left LOWER EXTREMITY RUNOFF;  Surgeon: Magda Debby SAILOR, MD;  Location: [facility] OR;  Service: Vascular;  Laterality: Bilateral;   APPLICATION OF  WOUND VAC  01/08/2022   Procedure: APPLICATION OF WOUND VAC;  Surgeon: Harden Jerona GAILS, MD;  Location: [facility] OR;  Service: Orthopedics;;   APPLICATION OF WOUND VAC Right 07/07/2022   Procedure: APPLICATION OF WOUND VAC;  Surgeon: Harden Jerona GAILS, MD;  Location: [facility] OR;  Service: Orthopedics;  Laterality: Right;   CARDIAC CATHETERIZATION  2006   CARDIAC CATHETERIZATION  1996   @ [facility]; when I had my heart attack   CARDIAC CATHETERIZATION N/A 07/29/2016   Procedure: Left Heart Cath and Coronary Angiography;  Surgeon: Dorn JINNY Lesches, MD;  Location: [facility] INVASIVE CV LAB;  Service: Cardiovascular;  Laterality: N/A;    CARDIAC CATHETERIZATION N/A 07/29/2016   Procedure: Coronary Stent Intervention;  Surgeon: Dorn JINNY Lesches, MD;  [facility]: [facility];  Service: Cardiovascular;  Laterality: N/A;   CARPAL TUNNEL RELEASE Bilateral    CORONARY ANGIOPLASTY WITH STENT PLACEMENT  07/29/2016   CORONARY ARTERY BYPASS GRAFT N/A 07/24/2020   Procedure: CORONARY ARTERY BYPASS GRAFTING (CABG), ON PUMP, TIMES FOUR, USING LEFT INTERNAL MAMMARY ARTERY AND ENDOSCOPICALLY HARVESTED RIGHT GREATER SAPHENOUS VEIN;  Surgeon: Shyrl Linnie KIDD, MD;  Location: [facility] OR;  Service: Open Heart Surgery;  Laterality: N/A;  FLOW TAC   CORONARY PRESSURE/FFR STUDY N/A 07/22/2020   Procedure: INTRAVASCULAR PRESSURE WIRE/FFR STUDY;  Surgeon: Anner Alm ORN, MD;  Location: [facility] INVASIVE CV LAB;  Service: Cardiovascular;  Laterality: N/A;   CORONARY STENT INTERVENTION N/A 05/30/2019   Procedure: CORONARY STENT INTERVENTION;  Surgeon: Claudene Victory ORN, MD;  [facility]: [facility];  Service: Cardiovascular;  Laterality: N/A;   CORONARY STENT INTERVENTION N/A 11/02/2019   Procedure: CORONARY STENT INTERVENTION;  Surgeon: Darron Deatrice LABOR, MD;  [facility]: [facility];  Service: Cardiovascular;  Laterality: N/A;   ESOPHAGOGASTRODUODENOSCOPY N/A 09/22/2017   Procedure: ESOPHAGOGASTRODUODENOSCOPY (EGD);  Surgeon: Aneita Gwendlyn DASEN, MD;  Location: [facility] ENDOSCOPY;  Service: Endoscopy;  Laterality: N/A;   KNEE ARTHROSCOPY Bilateral    2 on left; 1 on the right   LEFT HEART CATH AND CORONARY ANGIOGRAPHY N/A 11/02/2019   Procedure: LEFT HEART CATH AND CORONARY ANGIOGRAPHY;  Surgeon: Darron Deatrice LABOR, MD;  [facility];  Service: Cardiovascular;  Laterality: N/A;   LEFT HEART CATH AND CORONARY ANGIOGRAPHY N/A 07/22/2020   Procedure: LEFT HEART CATH AND CORONARY ANGIOGRAPHY;  Surgeon: Anner Alm ORN, MD;  Location: [facility] INVASIVE CV LAB;  Service: Cardiovascular;  Laterality: N/A;   LEFT HEART CATH AND CORS/GRAFTS ANGIOGRAPHY N/A  08/17/2021   Procedure: LEFT HEART CATH AND CORS/GRAFTS ANGIOGRAPHY;  Surgeon: Mady Bruckner, MD;  [facility]: [facility];  Service: Cardiovascular;  Laterality: N/A;   LEFT HEART CATH AND CORS/GRAFTS ANGIOGRAPHY N/A 01/09/2025   Procedure: LEFT HEART CATH AND CORS/GRAFTS ANGIOGRAPHY;  Surgeon: Verlin Bruckner BIRCH, MD;  Location: [facility];  Service: Cardiovascular;  Laterality: N/A;   PERIPHERAL VASCULAR INTERVENTION Left 10/11/2020   popliteal and SFA stent placement    PERIPHERAL VASCULAR INTERVENTION Left 10/10/2020   Procedure: PERIPHERAL VASCULAR INTERVENTION;  Surgeon: Harvey Carlin BRAVO, MD;  [facility];  Service: Cardiovascular;  Laterality: Left;   RIGHT/LEFT HEART CATH AND CORONARY ANGIOGRAPHY N/A 05/30/2019   Procedure: RIGHT/LEFT HEART CATH AND CORONARY ANGIOGRAPHY;  Surgeon: Claudene Victory ORN, MD;  [facility]: [facility];  Service: Cardiovascular;  Laterality: N/A;   RIGHT/LEFT HEART CATH AND CORONARY/GRAFT ANGIOGRAPHY N/A 06/17/2022   Procedure: RIGHT/LEFT HEART CATH AND CORONARY/GRAFT ANGIOGRAPHY;  Surgeon: Burnard Debby LABOR,  MD;  Location: [facility];  Service: Cardiovascular;  Laterality: N/A;   SHOULDER OPEN ROTATOR CUFF REPAIR Bilateral    TEE WITHOUT CARDIOVERSION N/A 07/24/2020   Procedure: TRANSESOPHAGEAL ECHOCARDIOGRAM (TEE);  Surgeon: Shyrl Linnie KIDD, MD;  Location: [facility] OR;  Service: Open Heart Surgery;  Laterality: N/A;    Current Medications: Active Medications[1]   Allergies:   Nsaids, Duloxetine, and Sulfa  antibiotics   Social History   Socioeconomic History   Marital status: Married    Spouse name: Not on file   Number of children: Not on file   Years of education: Not on file   Highest education level: Not on file  Occupational History   Occupation: disabled     Employer: UNEMPLOYED  Tobacco Use   Smoking status: Former    Current packs/day: 0.00    Average packs/day: 1 pack/day for 34.0 years (34.0 ttl  pk-yrs)    Types: Cigarettes    Start date: 07/12/1986    Quit date: 07/12/2020    Years since quitting: 4.5   Smokeless tobacco: Former    Types: Snuff, Chew  Vaping Use   Vaping status: Never Used  Substance and Sexual Activity   Alcohol use: Yes    Comment: RARE   Drug use: No   Sexual activity: Yes  Other Topics Concern   Not on file  Social History Narrative   Not on file   Social Drivers of Health   Tobacco Use: Medium Risk (01/12/2025)   Patient History    Smoking Tobacco Use: Former    Smokeless Tobacco Use: Former    Passive Exposure: Not on Actuary Strain: Medium Risk (09/09/2022)   Overall Financial Resource Strain (CARDIA)    Difficulty of Paying Living Expenses: Somewhat hard  Food Insecurity: No Food Insecurity (01/11/2025)   Epic    Worried About Programme Researcher, Broadcasting/film/video in the Last Year: Never true    Ran Out of Food in the Last Year: Never true  Transportation Needs: Unmet Transportation Needs (01/11/2025)   Epic    Lack of Transportation (Medical): Yes    Lack of Transportation (Non-Medical): Yes  Physical Activity: Not on file  Stress: No Stress Concern Present (10/10/2023)   Received from [facility] - Occupational Stress Questionnaire    Feeling of Stress : Not at all  Social Connections: Moderately Isolated (03/28/2024)   Social Connection and Isolation Panel    Frequency of Communication with Friends and Family: More than three times a week    Frequency of Social Gatherings with Friends and Family: More than three times a week    Attends Religious Services: Never    Database Administrator or Organizations: No    Attends Banker Meetings: Never    Marital Status: Married  Depression (PHQ2-9): Not on file  Alcohol Screen: Not on file  Housing: Low Risk (01/11/2025)   Epic    Unable to Pay for Housing in the Last Year: No    Number of Times Moved in the Last Year: 0    Homeless in  the Last Year: No  Utilities: Not At Risk (01/11/2025)   Epic    Threatened with loss of utilities: No  Health Literacy: Not on file     Family History: The patient's ***family history includes Diabetes in an other family member; Hyperlipidemia in an other family member; Hypertension in his brother and father.  EKGs/Labs/Other Studies Reviewed:  The following studies were reviewed today: ***      Recent Labs: 03/27/2024: B Natriuretic Peptide 90.4 01/09/2025: ALT 23 01/10/2025: Pro Brain Natriuretic Peptide 576.0 01/11/2025: Magnesium  1.8 01/15/2025: BUN 62; Creatinine, Ser 2.61; Hemoglobin 9.9; Platelets 512; Potassium 4.6; Sodium 134  Recent Lipid Panel    Component Value Date/Time   CHOL 110 01/09/2025 1920   CHOL 148 12/28/2024 1547   TRIG 205 (H) 01/09/2025 1920   HDL 18 (L) 01/09/2025 1920   HDL 39 (L) 12/28/2024 1547   CHOLHDL 6.1 01/09/2025 1920   VLDL 41 (H) 01/09/2025 1920   LDLCALC 51 01/09/2025 1920   LDLCALC 79 12/28/2024 1547   LDLDIRECT 120.0 05/28/2015 0951     Risk Assessment/Calculations:   {Does this patient have ATRIAL FIBRILLATION?:[phone]}            Physical Exam:    VS:  There were no vitals taken for this visit.       Wt Readings from Last 3 Encounters:  01/15/25 (!) 354 lb 15.1 oz (161 kg)  07/17/24 (!) 340 lb (154.2 kg)  04/27/24 (!) 360 lb (163.3 kg)     GEN: *** Well nourished, well developed in no acute distress HEENT: Normal NECK: No JVD; No carotid bruits CARDIAC: *** S1-S2 normal, RRR, no murmurs, rubs, gallops RESPIRATORY:  Clear to auscultation without rales, wheezing or rhonchi  MUSCULOSKELETAL:  No edema; No deformity  SKIN: Warm and dry NEUROLOGIC:  Alert and oriented x 3 PSYCHIATRIC:  Normal affect       Assessment & Plan   Disposition: *** Route to primary cardiologist      {Are you ordering a CV Procedure (e.g. stress test, cath, DCCV, TEE, etc)?   Press F2        :[phone]}    Medication  Adjustments/Labs and Tests Ordered: Current medicines are reviewed at length with the patient today.  Concerns regarding medicines are outlined above.  No orders of the defined types were placed in this encounter.  No orders of the defined types were placed in this encounter.   There are no Patient Instructions on file for this visit.   Signed, Miriam FORBES Shams, NP  01/15/2025 10:04 AM     [facility]    [1]  No outpatient medications have been marked as taking for the 01/17/25 encounter (Appointment) with Kennita Pavlovich E, NP.   "

## 2025-01-16 DIAGNOSIS — R338 Other retention of urine: Secondary | ICD-10-CM | POA: Diagnosis not present

## 2025-01-16 LAB — BASIC METABOLIC PANEL WITH GFR
Anion gap: 14 (ref 5–15)
BUN: 69 mg/dL — ABNORMAL HIGH (ref 6–20)
CO2: 21 mmol/L — ABNORMAL LOW (ref 22–32)
Calcium: 9.4 mg/dL (ref 8.9–10.3)
Chloride: 98 mmol/L (ref 98–111)
Creatinine, Ser: 2.72 mg/dL — ABNORMAL HIGH (ref 0.61–1.24)
GFR, Estimated: 27 mL/min — ABNORMAL LOW
Glucose, Bld: 224 mg/dL — ABNORMAL HIGH (ref 70–99)
Potassium: 4.9 mmol/L (ref 3.5–5.1)
Sodium: 133 mmol/L — ABNORMAL LOW (ref 135–145)

## 2025-01-16 LAB — CBC
HCT: 32.5 % — ABNORMAL LOW (ref 39.0–52.0)
Hemoglobin: 10.1 g/dL — ABNORMAL LOW (ref 13.0–17.0)
MCH: 26.4 pg (ref 26.0–34.0)
MCHC: 31.1 g/dL (ref 30.0–36.0)
MCV: 84.9 fL (ref 80.0–100.0)
Platelets: 536 10*3/uL — ABNORMAL HIGH (ref 150–400)
RBC: 3.83 MIL/uL — ABNORMAL LOW (ref 4.22–5.81)
RDW: 15.4 % (ref 11.5–15.5)
WBC: 13.3 10*3/uL — ABNORMAL HIGH (ref 4.0–10.5)
nRBC: 0 % (ref 0.0–0.2)

## 2025-01-16 LAB — GLUCOSE, CAPILLARY
Glucose-Capillary: 212 mg/dL — ABNORMAL HIGH (ref 70–99)
Glucose-Capillary: 165 mg/dL — ABNORMAL HIGH (ref 70–99)
Glucose-Capillary: 262 mg/dL — ABNORMAL HIGH (ref 70–99)
Glucose-Capillary: 288 mg/dL — ABNORMAL HIGH (ref 70–99)

## 2025-01-16 NOTE — TOC Progression Note (Signed)
 Transition of Care [facility]) - Progression Note    Patient Details  Name: Joseph [name] MRN: [id number] Date of Birth: 1971/10/30  Transition of [facility] CM/SW Contact  [name], [name] Phone Number: 01/16/2025, 8:58 AM  Clinical Narrative:     CSW met with patient at bedside. CSW introduced self and explained role. Patient given bed offers as discussed w/ Heart Failure CSW. He states he will review but really considering returning home w/ spouse. CSW explained the SNF process and insurance coverage. All questions answered. He will review , discuss w/ his wife and update CSW of his decision.  Montie Louder, MSW, LCSW Clinical Social Worker    Expected Discharge Plan: Home/Self Care Barriers to Discharge: Continued Medical Work up               Expected Discharge Plan and Services       Living arrangements for the past 2 months: Single Family Home                                       Social Drivers of Health (SDOH) Interventions SDOH Screenings   Food Insecurity: No Food Insecurity (01/11/2025)  Housing: Low Risk (01/11/2025)  Transportation Needs: Unmet Transportation Needs (01/11/2025)  Utilities: Not At Risk (01/11/2025)  Financial Resource Strain: Medium Risk (09/09/2022)  Social Connections: Moderately Isolated (03/28/2024)  Stress: No Stress Concern Present (10/10/2023)   Received from [facility]  Tobacco Use: Medium Risk (01/12/2025)    Readmission Risk Interventions    05/03/2023   11:58 AM  Readmission Risk Prevention Plan  Transportation Screening Complete  PCP or Specialist Appt within 3-5 Days Complete  HRI or Home Care Consult Complete  Social Work Consult for Recovery Care Planning/Counseling Complete  Palliative Care Screening Not Applicable  Medication Review Oceanographer) Referral to Pharmacy

## 2025-01-16 NOTE — Progress Notes (Addendum)
 [facility] PROGRESS NOTE    Progress Note  Joseph [name]  FMW:[id number] DOB: October 02, 1971 DOA: 01/09/2025 PCP: Alec House, MD     Brief Narrative:   Joseph [name] is an 54 y.o. male past medical history of asthma COPD diabetes mellitus type 2, essential hypertension, PE/DVT on Xarelto , history of PAD status post bilateral BKA, history of CAD with a history of CABG who comes into the ED for chest pain initially admitted to the ICU on bilevel as he was unable to protect his airway with toxic metabolic encephalopathy and acute urinary retention.  Patient was complaining of pain diffusely improved with home medications including Lyrica    Assessment/Plan:   Acute metabolic encephalopathy: Likely due to uremia, CO2 narcosis and possible DKA there was also question about polypharmacy. Improved with holding medication and metabolic derangement. PT evaluated the patient, will need skilled nursing facility placement.  Multiple complaints of pain: CT cervical spine was unrevealing. Started back on her medication currently pain-free.  Hyperkalemia: Resolved with IV fluids and Lokelma .  Acute kidney injury on chronic kidney disease stage IIIb/anion gap metabolic acidosis: Multifactorial in the setting of urinary retention, hemodynamically mediated and recent contrast exposure UA was bland nephrology was consulted recommended conservative management, creatinine has returned to baseline.  Insulin -dependent diabetes mellitus type 2: Currently on long-acting insulin  plus sliding scale, continue current regimen. Blood glucose slowly improving. Last A1c of 7.9.  Sepsis is clinically supported  of right leg stump: 1/2 blood cultures was positive likely a contaminant. Will complete oral antibiotics Augmentin  and linezolid  for 10-day course.  COPD: He has been weaned to room air.  History of CABG: There was a concern for anginal pain underwent cardiac cath which was unrevealing  doubt PE as he is not hypoxic. Continue aspirin  and Plavix .  History of DVT/PE: Not on a DOAC at home remote history of PE in 2014.  Depression/anxiety: Continue Wellbutrin  and Lexapro .  Chronic pain: Continue current medication.  Morbid obesity. Noted   DVT prophylaxis: lovenox  Family Communication:none Status is: Inpatient Remains inpatient appropriate because: Acute metabolic encephalopathy currently awaiting placement.    Code Status:     Code Status Orders  (From admission, onward)           Start     Ordered   01/09/25 2045  Full code  Continuous       Question:  By:  Answer:  Default: patient does not have capacity for decision making, no surrogate or prior directive available   01/09/25 2046           Code Status History     Date Active Date Inactive Code Status Order ID Comments User Context   01/09/2025 2018 01/09/2025 2046 Full Code [id number]  Verlin Lonni BIRCH, MD Inpatient   03/28/2024 0139 04/06/2024 1725 Full Code [id number]  Shona Kashten SAILOR, DO ED   01/07/2024 0304 01/16/2024 1750 Full Code [id number]  Lonzell Emeline HERO, DO ED   11/26/2023 1323 12/03/2023 1921 Full Code [id number]  Lou Claretta HERO, MD ED   04/26/2023 1500 05/03/2023 2013 Full Code [id number]  Bryan Bianchi, MD ED   06/30/2022 1924 07/14/2022 0226 Full Code [id number]  Seena Marsa NOVAK, MD ED   06/17/2022 1124 06/17/2022 1826 Full Code [id number]  Burnard Debby LABOR, MD Inpatient   04/14/2022 0015 04/16/2022 1806 Full Code [id number]  Lenda Garre, MD ED   01/06/2022 1533 01/14/2022 2339 Full Code [id number]  Autry-Lott, Rojean, DO ED   11/18/2021 0420 11/26/2021 1809 Full Code  [id number]  Alfornia Madison, MD ED   08/15/2021 1720 08/20/2021 2357 Full Code [id number]  Jerrie Anger, PA ED   07/17/2021 0452 07/17/2021 1913 Full Code [id number]  Manfred Driver, DO ED   07/10/2021 0359 07/13/2021 2204 Full Code [id number]  Zierle-Ghosh, Asia B, DO ED   04/11/2021 2203 04/14/2021 2225 Full Code  [id number]  Barbra Lang PARAS, DO Inpatient   03/05/2021 0029 03/15/2021 0018 Full Code [id number]  Selene Henderson, MD ED   02/18/2021 1902 02/19/2021 2233 Full Code [id number]  Barbaraann Katz, MD ED   10/10/2020 1838 10/11/2020 1626 Full Code [id number]  Harvey Carlin BRAVO, MD Inpatient   07/24/2020 1641 07/31/2020 1933 Full Code [id number]  Lindia Laurel MATSU, PA-C Inpatient   07/18/2020 0354 07/24/2020 1641 Full Code [id number]  Manfred Driver, DO ED   11/01/2019 1929 11/03/2019 1930 Full Code [id number]  Henry Manuelita NOVAK, NP ED   05/30/2019 1958 05/31/2019 1623 Full Code [id number]  Claudene Victory ORN, MD Inpatient   05/30/2019 1323 05/30/2019 1958 Full Code [id number]  Claudene Victory ORN, MD Inpatient   09/16/2017 1825 09/23/2017 1942 Full Code [id number]  Evonnie Lenis, MD Inpatient   07/29/2016 0037 07/30/2016 1713 Full Code [id number]  Lonzell Emeline HERO, DO ED      Advance Directive Documentation    Flowsheet Row Most Recent Value  Type of Advance Directive Healthcare Power of Attorney  Pre-existing out of facility DNR order (yellow form or pink MOST form) --  MOST Form in Place? --      IV Access:   Peripheral IV   Procedures and diagnostic studies:   VAS US  UPPER EXTREMITY ARTERIAL DUPLEX Result Date: 01/14/2025  UPPER EXTREMITY DUPLEX STUDY Patient Name:  Joseph [name] [facility]  Date of Exam:   01/14/2025 Medical Rec #: [id number]        Accession #:    [id number] Date of Birth: 1971/08/02        Patient Gender: M Patient Age:   35 years Exam Location:  [facility] Procedure:      VAS US  UPPER EXTREMITY ARTERIAL DUPLEX Referring Phys: HARLENE BOWL --------------------------------------------------------------------------------  Indications: Hand and shoulder pain. History:     Patient has a history of catheterization via left radial artery and              Radial cath 01/09/25.  Risk Factors:  Hypertension, hyperlipidemia, Diabetes, past history of smoking,                coronary artery disease. Other Factors:  Chronic bilateral shoulder pain, bilateral BKA Limitations: Patient movement, inability to move arm away from body, multiple              interruptions secondary to chest and bilateral shoulder pain Comparison Study: No prior study on file Performing Technologist: Alberta Lis RVS  Examination Guidelines: A complete evaluation includes B-mode imaging, spectral Doppler, color Doppler, and power Doppler as needed of all accessible portions of each vessel. Bilateral testing is considered an integral part of a complete examination. Limited examinations for reoccurring indications may be performed as noted.  Left Doppler Findings: +---------------+----------+-----------+--------+------------------+ Site           PSV (cm/s)Waveform   StenosisComments           +---------------+----------+-----------+--------+------------------+ Subclavian Mid 106       multiphasic                           +---------------+----------+-----------+--------+------------------+ Subclavian Dist104  mulitphasic                           +---------------+----------+-----------+--------+------------------+ Axillary       89        triphasic                             +---------------+----------+-----------+--------+------------------+ Brachial Prox  114       triphasic                             +---------------+----------+-----------+--------+------------------+ Brachial Mid   120       triphasic                             +---------------+----------+-----------+--------+------------------+ Brachial Dist  107       triphasic                             +---------------+----------+-----------+--------+------------------+ Radial Prox    93        triphasic                             +---------------+----------+-----------+--------+------------------+ Radial Mid     76        triphasic                             +---------------+----------+-----------+--------+------------------+  Radial Dist    70        triphasic                             +---------------+----------+-----------+--------+------------------+ Ulnar Prox                                  Unable to insonate +---------------+----------+-----------+--------+------------------+ Ulnar Mid                                   Unable to insonate +---------------+----------+-----------+--------+------------------+ Ulnar Dist                                  Unable to insonate +---------------+----------+-----------+--------+------------------+ Palmar Arch    60        triphasic          Snuff box          +---------------+----------+-----------+--------+------------------+   Summary:  Left: No obstruction visualized in the left upper extremity. Ulnar       Artery unable to insonate due to patient movement *See table(s) above for measurements and observations. Electronically signed by Fonda Rim on 01/14/2025 at 2:36:59 PM.    Final      Medical Consultants:   None.   Subjective:    Joseph [name]   Objective:    Vitals:   01/15/25 2001 01/15/25 2349 01/16/25 0404 01/16/25 0817  BP: (!) 123/98 (!) 124/109 119/85 (!) 151/93  Pulse: 90 94 97 90  Resp: (!) 25 20 20 20   Temp: 99.8 F (37.7 C) 98.6 F (37 C) 99.7 F (37.6 C) 98.6  F (37 C)  TempSrc: Oral Oral Oral Oral  SpO2: 98%   98%  Weight:   (!) 160.9 kg   Height:       SpO2: 98 % O2 Flow Rate (L/min): 2 L/min FiO2 (%): 40 %   Intake/Output Summary (Last 24 hours) at 01/16/2025 0926 Last data filed at 01/15/2025 1944 Gross per 24 hour  Intake 600 ml  Output 300 ml  Net 300 ml   Filed Weights   01/14/25 0313 01/15/25 0401 01/16/25 0404  Weight: (!) 161.2 kg (!) 161 kg (!) 160.9 kg    Exam: General exam: In no acute distress. Respiratory system: Good air movement and clear to auscultation. Cardiovascular system: S1 & S2 heard, RRR. No JVD. Gastrointestinal system: Abdomen is nondistended, soft and nontender.   Extremities: No pedal edema. Skin: No rashes, lesions or ulcers Psychiatry: Judgement and insight appear normal. Mood & affect appropriate.    Data Reviewed:    Labs: Basic Metabolic Panel: Recent Labs  Lab 01/09/25 2240 01/09/25 2249 01/11/25 0247 01/12/25 9676 01/13/25 0313 01/14/25 0139 01/15/25 0333 01/16/25 0351  NA 128*   < > 136 137 136 137 134* 133*  K 6.6*   < > 5.3* 4.9 5.0 5.8* 4.6 4.9  CL 95*   < > 99 98 100 101 98 98  CO2 14*   < > 23 22 24  20* 23 21*  GLUCOSE 391*   < > 185* 258* 223* 229* 262* 224*  BUN 91*   < > 81* 79* 69* 61* 62* 69*  CREATININE 4.62*   < > 3.86* 3.67* 3.05* 2.76* 2.61* 2.72*  CALCIUM  8.7*   < > 8.6* 8.8* 8.9 9.2 9.1 9.4  MG 1.8  --  1.8  --   --   --   --   --   PHOS 4.2  --   --   --   --   --   --   --    < > = values in this interval not displayed.   GFR Estimated Creatinine Clearance: 43.5 mL/min (A) (by C-G formula based on SCr of 2.72 mg/dL (H)). Liver Function Tests: Recent Labs  Lab 01/09/25 1920  AST 21  ALT 23  ALKPHOS 134*  BILITOT 0.7  PROT 7.4  ALBUMIN  3.0*   Recent Labs  Lab 01/09/25 2240  LIPASE 19   Recent Labs  Lab 01/09/25 2240  AMMONIA 17   Coagulation profile Recent Labs  Lab 01/09/25 1920  INR 1.4*   COVID-19 Labs  No results for input(s): DDIMER, FERRITIN, LDH, CRP in the last 72 hours.  Lab Results  Component Value Date   SARSCOV2NAA NEGATIVE 01/09/2025   SARSCOV2NAA NEGATIVE 01/07/2024   SARSCOV2NAA NEGATIVE 04/13/2022   SARSCOV2NAA POSITIVE (A) 01/06/2022    CBC: Recent Labs  Lab 01/09/25 1920 01/09/25 1930 01/12/25 0323 01/13/25 0313 01/14/25 0139 01/15/25 0333 01/16/25 0351  WBC 14.0*   < > 12.2* 11.5* 12.5* 13.2* 13.3*  NEUTROABS 11.8*  --   --   --   --   --   --   HGB 10.0*   < > 8.9* 9.2* 10.7* 9.9* 10.1*  HCT 31.2*   < > 27.7* 28.8* 33.8* 31.6* 32.5*  MCV 84.1   < > 83.2 83.5 85.4 84.5 84.9  PLT 299   < > 400 407* 465* 512* 536*   < > = values in this  interval not displayed.   Cardiac Enzymes: No results for input(s):  CKTOTAL, CKMB, CKMBINDEX, TROPONINI in the last 168 hours. BNP (last 3 results) Recent Labs    04/27/24 1051 12/17/24 1056 01/10/25 1007  PROBNP 895* 285* 576.0*   CBG: Recent Labs  Lab 01/15/25 0557 01/15/25 1202 01/15/25 1710 01/15/25 2112 01/16/25 0618  GLUCAP 238* 229* 283* 259* 212*   D-Dimer: No results for input(s): DDIMER in the last 72 hours. Hgb A1c: No results for input(s): HGBA1C in the last 72 hours. Lipid Profile: No results for input(s): CHOL, HDL, LDLCALC, TRIG, CHOLHDL, LDLDIRECT in the last 72 hours. Thyroid function studies: No results for input(s): TSH, T4TOTAL, T3FREE, THYROIDAB in the last 72 hours.  Invalid input(s): FREET3 Anemia work up: No results for input(s): VITAMINB12, FOLATE, FERRITIN, TIBC, IRON, RETICCTPCT in the last 72 hours. Sepsis Labs: Recent Labs  Lab 01/09/25 1930 01/09/25 2240 01/10/25 0148 01/13/25 0313 01/14/25 0139 01/15/25 0333 01/16/25 0351  WBC  --  12.9*   < > 11.5* 12.5* 13.2* 13.3*  LATICACIDVEN 0.8 1.9  --   --   --   --   --    < > = values in this interval not displayed.   Microbiology Recent Results (from the past 240 hours)  MRSA Next Gen by PCR, Nasal     Status: Abnormal   Collection Time: 01/09/25  8:24 PM   Specimen: Nasal Mucosa; Nasal Swab  Result Value Ref Range Status   MRSA by PCR Next Gen DETECTED (A) NOT DETECTED Final    Comment: RESULT CALLED TO, READ BACK BY AND VERIFIED WITH: J CRUZ REYES RN 01/09/2025 @ 2250 BY AB (NOTE) The GeneXpert MRSA Assay (FDA approved for NASAL specimens only), is one component of a comprehensive MRSA colonization surveillance program. It is not intended to diagnose MRSA infection nor to guide or monitor treatment for MRSA infections. Test performance is not FDA approved in patients less than 6 years old. Performed at [facility], [address] [place], [name] [address]   Culture, blood (Routine X 2) w Reflex to ID Panel     Status: Abnormal   Collection Time: 01/09/25  8:43 PM   Specimen: BLOOD  Result Value Ref Range Status   Specimen Description BLOOD SITE NOT SPECIFIED  Final   Special Requests   Final    BOTTLES DRAWN AEROBIC AND ANAEROBIC Blood Culture results may not be optimal due to an inadequate volume of blood received in culture bottles   Culture  Setup Time   Final    GRAM POSITIVE RODS AEROBIC BOTTLE ONLY CRITICAL RESULT CALLED TO, READ BACK BY AND VERIFIED WITH: PHARMD A. PAYTES [id number] @ 2033 FH    Culture (A)  Final    CORYNEBACTERIUM MINUTISSIMUM Standardized susceptibility testing for this organism is not available. Performed at [facility], [address], [place], [name] [address]    Report Status 01/14/2025 FINAL  Final  Resp panel by RT-PCR (RSV, Flu A&B, Covid) Anterior Nasal Swab     Status: None   Collection Time: 01/09/25  8:46 PM   Specimen: Anterior Nasal Swab  Result Value Ref Range Status   SARS Coronavirus 2 by RT PCR NEGATIVE NEGATIVE Final   Influenza A by PCR NEGATIVE NEGATIVE Final   Influenza B by PCR NEGATIVE NEGATIVE Final    Comment: (NOTE) The Xpert Xpress SARS-CoV-2/FLU/RSV plus assay is intended as an aid in the diagnosis of influenza from Nasopharyngeal swab specimens and should not be used as a sole basis for treatment.  Nasal washings and aspirates are unacceptable for Xpert Xpress SARS-CoV-2/FLU/RSV testing.  Fact Sheet for Patients: bloggercourse.com  Fact Sheet for Healthcare Providers: seriousbroker.it  This test is not yet approved or cleared by the [place]  FDA and has been authorized for detection and/or diagnosis of SARS-CoV-2 by FDA under an Emergency Use Authorization (EUA). This EUA will remain in effect (meaning this test can be used) for the duration of the COVID-19 declaration under  Section 564(b)(1) of the Act, 21 U.S.C. section 360bbb-3(b)(1), unless the authorization is terminated or revoked.     Resp Syncytial Virus by PCR NEGATIVE NEGATIVE Final    Comment: (NOTE) Fact Sheet for Patients: bloggercourse.com  Fact Sheet for Healthcare Providers: seriousbroker.it  This test is not yet approved or cleared by the [place]  FDA and has been authorized for detection and/or diagnosis of SARS-CoV-2 by FDA under an Emergency Use Authorization (EUA). This EUA will remain in effect (meaning this test can be used) for the duration of the COVID-19 declaration under Section 564(b)(1) of the Act, 21 U.S.C. section 360bbb-3(b)(1), unless the authorization is terminated or revoked.  Performed at [facility], [address], [place], [name] [address]   Respiratory (~20 pathogens) panel by PCR     Status: None   Collection Time: 01/09/25  8:46 PM   Specimen: Nasopharyngeal Swab; Respiratory  Result Value Ref Range Status   Adenovirus NOT DETECTED NOT DETECTED Final   Coronavirus 229E NOT DETECTED NOT DETECTED Final    Comment: (NOTE) The Coronavirus on the Respiratory Panel, DOES NOT test for the novel  Coronavirus (2019 nCoV)    Coronavirus HKU1 NOT DETECTED NOT DETECTED Final   Coronavirus NL63 NOT DETECTED NOT DETECTED Final   Coronavirus OC43 NOT DETECTED NOT DETECTED Final   Metapneumovirus NOT DETECTED NOT DETECTED Final   Rhinovirus / Enterovirus NOT DETECTED NOT DETECTED Final   Influenza A NOT DETECTED NOT DETECTED Final   Influenza B NOT DETECTED NOT DETECTED Final   Parainfluenza Virus 1 NOT DETECTED NOT DETECTED Final   Parainfluenza Virus 2 NOT DETECTED NOT DETECTED Final   Parainfluenza Virus 3 NOT DETECTED NOT DETECTED Final   Parainfluenza Virus 4 NOT DETECTED NOT DETECTED Final   Respiratory Syncytial Virus NOT DETECTED NOT DETECTED Final   Bordetella pertussis NOT DETECTED NOT DETECTED  Final   Bordetella Parapertussis NOT DETECTED NOT DETECTED Final   Chlamydophila pneumoniae NOT DETECTED NOT DETECTED Final   Mycoplasma pneumoniae NOT DETECTED NOT DETECTED Final    Comment: Performed at [facility], [address] [place], [name] [address]  Culture, blood (Routine X 2) w Reflex to ID Panel     Status: None   Collection Time: 01/09/25 10:40 PM   Specimen: BLOOD  Result Value Ref Range Status   Specimen Description BLOOD SITE NOT SPECIFIED  Final   Special Requests   Final    BOTTLES DRAWN AEROBIC ONLY Blood Culture results may not be optimal due to an inadequate volume of blood received in culture bottles   Culture   Final    NO GROWTH 5 DAYS Performed at [facility], [address], [place], [name] [address]    Report Status 01/14/2025 FINAL  Final     Medications:    amoxicillin -clavulanate  1 tablet Oral Q12H   aspirin  EC  81 mg Oral q AM   atorvastatin   80 mg Oral Daily   buPROPion   300 mg Oral q morning   Chlorhexidine  Gluconate  Cloth  6 each Topical Daily   clopidogrel   75 mg Oral Daily   feeding supplement  237 mL Oral BID BM   heparin   5,000 Units Subcutaneous Q8H   insulin  aspart  0-15 Units Subcutaneous TID WC   insulin  aspart  0-5 Units Subcutaneous QHS   insulin  aspart  5 Units Subcutaneous TID WC   insulin  glargine  50 Units Subcutaneous Daily   insulin  glargine-yfgn  25 Units Subcutaneous QHS   lidocaine   2 patch Transdermal Q24H   linezolid   600 mg Oral Q12H   pregabalin   150 mg Oral BID   sodium chloride  flush  3 mL Intravenous Q12H   Continuous Infusions:    LOS: 7 days   Joseph [name]  [facility]  01/16/2025, 9:26 AM
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
## 2025-01-17 ENCOUNTER — Ambulatory Visit: Admitting: Emergency Medicine

## 2025-01-17 DIAGNOSIS — R338 Other retention of urine: Secondary | ICD-10-CM | POA: Diagnosis not present

## 2025-01-17 LAB — GLUCOSE, CAPILLARY
Glucose-Capillary: 363 mg/dL — ABNORMAL HIGH (ref 70–99)
Glucose-Capillary: 288 mg/dL — ABNORMAL HIGH (ref 70–99)
Glucose-Capillary: 172 mg/dL — ABNORMAL HIGH (ref 70–99)
Glucose-Capillary: 182 mg/dL — ABNORMAL HIGH (ref 70–99)

## 2025-01-17 MED ORDER — INSULIN GLARGINE 100 UNIT/ML ~~LOC~~ SOLN
50.0000 [IU] | Freq: Two times a day (BID) | SUBCUTANEOUS | Status: DC
  Administered 2025-01-17 – 2025-01-18 (×4): 50 [IU] via SUBCUTANEOUS
  Filled 2025-01-17 (×6): qty 0.5

## 2025-01-17 NOTE — Progress Notes (Signed)
 Physical Therapy Treatment Patient Details Name: Joseph [name] MRN: [id number] DOB: Apr 14, 1971 Today's Date: 01/17/2025   History of Present Illness Pt is a 54 y.o. male who presented 01/09/25 with chest pain. EKG showed ST changes in the anterior leads so he was taken to the cath lab, cath showed no acute lesion. Admitted with acute metabolic encephalopathy, hyperglycemia, hyperkalemia, hyponatremia, AKI on CKD, SIRS. PMH: asthma, COPD, chronic bronchitis, morbid obesity, OSA, diabetes, GERD, HTN, HLD, PE, DVT on Xarelto , PAD s/p bilateral BKA, CAD s/p CABG x4, aneurysm of ascending aorta, arthritis, DM2, MI, PE    PT Comments  Pt resting in bed on arrival, agreeable to session and demonstrating slow but steady progress towards acute goals as pt continues to be limited by bil shoulder pain, limiting ROM and weight bearing through Ues to progress OOB transfers. Pt able to come to sitting EOB with min A with pt utilizing momentum and walking Les toward EOB with little to no UE use. Pt able to scoot along EOB and towards HOB in long sitting with light min A. Pt with poor insight into current medical circumstances, as pt requesting to walk on knees on blanket in room with wound present on residual limb. Educated pt on decreased sensation in [place] and importance of limiting weight bearing on wounds and utilizing timer on phone to schedule pressure relief and change positions, pt verbalizing understanding. Pt continues to benefit from skilled PT services to progress toward functional mobility goals.      If plan is discharge home, recommend the following: Two people to help with walking and/or transfers;Two people to help with bathing/dressing/bathroom;Assistance with cooking/housework;Assistance with feeding;Direct supervision/assist for medications management;Direct supervision/assist for financial management;Assist for transportation;Help with stairs or ramp for entrance;Supervision due to cognitive status    Can travel by private vehicle     No  Equipment Recommendations  Wheelchair cushion (measurements PT);Hospital bed;Hoyer lift (roho cushion, air mattress overlay, bil BKA hoyer lift pad)    Recommendations for Other Services       Precautions / Restrictions Precautions Precautions: Fall;Other (comment) Precaution/Restrictions Comments: hypersensitive to all touch, esp L shoulder; chronic bil BKA; wounds reported on buttocks and noted at BKA Restrictions Weight Bearing Restrictions Per Provider Order: No     Mobility  Bed Mobility Overal bed mobility: Needs Assistance Bed Mobility: Supine to Sit     Supine to sit: Min assist Sit to supine: Contact guard assist   General bed mobility comments: pt able to some to sit EOB with use of momentum and walking LEs to WOB, pt retruning to center of the bed at maintaining long sitting at end of session    Transfers                   General transfer comment: worked on scooting within bed.pt able to scoot along EOB to [facility] and backwards toward HOB in long sitting with use of momentum and walking on bottom, unable to utilize UEs due to pain    Ambulation/Gait               General Gait Details: n/a   Stairs             Wheelchair Mobility     Tilt Bed    Modified Rankin (Stroke Patients Only)       Balance Overall balance assessment: Needs assistance Sitting-balance support: Feet unsupported, No upper extremity supported Sitting balance-Leahy Scale: Good Sitting balance - Comments: CGA for safety with scooting  in sitting                                    Communication Communication Communication: No apparent difficulties  Cognition Arousal: Alert Behavior During Therapy: WFL for tasks assessed/performed   PT - Cognitive impairments: History of cognitive impairments                       PT - Cognition Comments: pt following commands today and attending throughout  session. Though does not seem to understand why going home will be more difficult when he is not using his UEs at all and has been crawling up the stairs with both arms Following commands: Impaired Following commands impaired: Follows one step commands with increased time    Cueing Cueing Techniques: Verbal cues, Tactile cues, Gestural cues  Exercises Amputee Exercises Hip ABduction/ADduction: AROM, Right, Left, 10 reps, Seated    General Comments General comments (skin integrity, edema, etc.): VSS on RA      Pertinent Vitals/Pain Pain Assessment Pain Assessment: Faces Faces Pain Scale: Hurts whole lot Pain Location: B shoulders Pain Descriptors / Indicators: Discomfort, Grimacing, Guarding, Moaning Pain Intervention(s): Monitored during session, Limited activity within patient's tolerance    Home Living                          Prior Function            PT Goals (current goals can now be found in the care plan section) Acute Rehab PT Goals Patient Stated Goal: to reduce pain PT Goal Formulation: With patient/family Time For Goal Achievement: 01/26/25 Progress towards PT goals: Progressing toward goals    Frequency    Min 2X/week      PT Plan      Co-evaluation              AM-PAC PT 6 Clicks Mobility   Outcome Measure  Help needed turning from your back to your side while in a flat bed without using bedrails?: A Lot Help needed moving from lying on your back to sitting on the side of a flat bed without using bedrails?: A Lot Help needed moving to and from a bed to a chair (including a wheelchair)?: Total Help needed standing up from a chair using your arms (e.g., wheelchair or bedside chair)?: Total Help needed to walk in hospital room?: Total Help needed climbing 3-5 steps with a railing? : Total 6 Click Score: 8    End of Session   Activity Tolerance: Patient limited by pain Patient left: in bed;with call bell/phone within reach;with  nursing/sitter in room Nurse Communication: Mobility status PT Visit Diagnosis: Muscle weakness (generalized) (M62.81);Difficulty in walking, not elsewhere classified (R26.2);Pain;History of falling (Z91.81) Pain - Right/Left:  (bil) Pain - part of body:  (generalized)     Time: 8781-8758 PT Time Calculation (min) (ACUTE ONLY): 23 min  Charges:    $Therapeutic Activity: 23-37 mins PT General Charges $$ ACUTE PT VISIT: 1 Visit                     Ronesha Heenan R. PTA Acute Rehabilitation Services Office: [phone]   Therisa CHRISTELLA Boor 01/17/2025, 1:23 PM

## 2025-01-17 NOTE — TOC Progression Note (Signed)
 Transition of Care [facility]) - Progression Note    Patient Details  Name: Joseph [name] MRN: [id number] Date of Birth: Sep 24, 1971  Transition of Care [facility]) CM/SW Contact  [name] Phone Number: [phone] 01/17/2025, 12:52 PM  Clinical Narrative:   [facility] CSW attempted to meet with patient at bedside. Patient was not in the room at the time. CSW will follow up at a more appropriate time.   HF CSW/CM will continue to follow and monitor for dc readiness.     Expected Discharge Plan: Home/Self Care Barriers to Discharge: Continued Medical Work up               Expected Discharge Plan and Services       Living arrangements for the past 2 months: Single Family Home                                       Social Drivers of Health (SDOH) Interventions SDOH Screenings   Food Insecurity: No Food Insecurity (01/11/2025)  Housing: Low Risk (01/11/2025)  Transportation Needs: Unmet Transportation Needs (01/11/2025)  Utilities: Not At Risk (01/11/2025)  Financial Resource Strain: Medium Risk (09/09/2022)  Social Connections: Moderately Isolated (03/28/2024)  Stress: No Stress Concern Present (10/10/2023)   [facility] from [facility]  Tobacco Use: Medium Risk (01/12/2025)    Readmission Risk Interventions    05/03/2023   11:58 AM  Readmission Risk Prevention Plan  Transportation Screening Complete  PCP or Specialist Appt within 3-5 Days Complete  HRI or Home Care Consult Complete  Social Work Consult for Recovery Care Planning/Counseling Complete  Palliative Care Screening Not Applicable  Medication Review Oceanographer) Referral to Pharmacy

## 2025-01-17 NOTE — Progress Notes (Addendum)
 [facility] PROGRESS NOTE    Progress Note  Joseph [name]  FMW:[id number] DOB: 17-Dec-1971 DOA: 01/09/2025 PCP: Alec House, MD     Brief Narrative:   Joseph [name] is an 54 y.o. male past medical history of asthma COPD diabetes mellitus type 2, essential hypertension, PE/DVT on Xarelto , history of PAD status post bilateral BKA, history of CAD with a history of CABG who comes into the ED for chest pain initially admitted to the ICU on bilevel as he was unable to protect his airway with toxic metabolic encephalopathy and acute urinary retention.  Patient was complaining of pain diffusely improved with home medications including Lyrica    Assessment/Plan:   Acute metabolic encephalopathy: Likely due to uremia, CO2 narcosis and possible DKA there was also question about polypharmacy. Improved with holding medication and metabolic derangement. PT evaluated the patient, will need skilled nursing facility placement.  Multiple complaints of pain: CT cervical spine was unrevealing. Started back on her medication currently pain-free.  Hyperkalemia: Resolved with IV fluids and Lokelma .  Acute kidney injury on chronic kidney disease stage IIIb/anion gap metabolic acidosis: Multifactorial in the setting of urinary retention, hemodynamically mediated and recent contrast exposure Nephrology was consulted recommended conservative management, creatinine has returned to baseline.  Insulin -dependent diabetes mellitus type 2: Currently on long-acting insulin  plus sliding scale. Leukos went up to 300 increase long-acting insulin  to 50 units twice a day. Last A1c of 7.9.  Sepsis is clinically supported  of right leg stump: 1/2 blood cultures was positive likely a contaminant. Will complete oral antibiotics Augmentin  and linezolid  for 10-day course.  COPD: He has been weaned to room air.  History of CABG: There was a concern for anginal pain underwent cardiac cath which was  unrevealing, doubt PE as he is not hypoxic. Continue aspirin  and Plavix .  History of DVT/PE: Not on a DOAC at home remote history of PE in 2014.  Depression/anxiety: Continue Wellbutrin  and Lexapro .  Chronic pain: Continue current medication.  Morbid obesity. Noted.  Narcotic seeking behavior: I have explained to the patient at length we cannot increase his narcotics or give him IV narcotics   DVT prophylaxis: lovenox  Family Communication:none Status is: Inpatient Remains inpatient appropriate because: Acute metabolic encephalopathy currently awaiting placement.    Code Status:     Code Status Orders  (From admission, onward)           Start     Ordered   01/09/25 2045  Full code  Continuous       Question:  By:  Answer:  Default: patient does not have capacity for decision making, no surrogate or prior directive available   01/09/25 2046           Code Status History     Date Active Date Inactive Code Status Order ID Comments User Context   01/09/2025 2018 01/09/2025 2046 Full Code [id number]  Verlin Lonni BIRCH, MD Inpatient   03/28/2024 0139 04/06/2024 1725 Full Code [id number]  Shona Petros SAILOR, DO ED   01/07/2024 0304 01/16/2024 1750 Full Code [id number]  Lonzell Emeline HERO, DO ED   11/26/2023 1323 12/03/2023 1921 Full Code [id number]  Lou Claretta HERO, MD ED   04/26/2023 1500 05/03/2023 2013 Full Code [id number]  Bryan Bianchi, MD ED   06/30/2022 1924 07/14/2022 0226 Full Code [id number]  Seena Marsa NOVAK, MD ED   06/17/2022 1124 06/17/2022 1826 Full Code [id number]  Burnard Debby LABOR, MD Inpatient   04/14/2022 0015 04/16/2022 1806 Full Code [id number]  Lenda Garre, MD ED   01/06/2022 1533 01/14/2022 2339 Full Code [id number]  Letha Savant, DO ED   11/18/2021 0420 11/26/2021 1809 Full Code [id number]  Alfornia Madison, MD ED   08/15/2021 1720 08/20/2021 2357 Full Code [id number]  Jerrie Anger, PA ED   07/17/2021 0452 07/17/2021 1913 Full Code [id number]   Manfred Driver, DO ED   07/10/2021 0359 07/13/2021 2204 Full Code [id number]  Zierle-Ghosh, Asia B, DO ED   04/11/2021 2203 04/14/2021 2225 Full Code [id number]  Barbra Lang PARAS, DO Inpatient   03/05/2021 0029 03/15/2021 0018 Full Code [id number]  Selene Henderson, MD ED   02/18/2021 1902 02/19/2021 2233 Full Code [id number]  Barbaraann Katz, MD ED   10/10/2020 1838 10/11/2020 1626 Full Code [id number]  Harvey Carlin BRAVO, MD Inpatient   07/24/2020 1641 07/31/2020 1933 Full Code [id number]  Lindia Laurel MATSU, PA-C Inpatient   07/18/2020 0354 07/24/2020 1641 Full Code [id number]  Manfred Driver, DO ED   11/01/2019 1929 11/03/2019 1930 Full Code [id number]  Henry Manuelita NOVAK, NP ED   05/30/2019 1958 05/31/2019 1623 Full Code [id number]  Claudene Victory ORN, MD Inpatient   05/30/2019 1323 05/30/2019 1958 Full Code [id number]  Claudene Victory ORN, MD Inpatient   09/16/2017 1825 09/23/2017 1942 Full Code [id number]  Evonnie Lenis, MD Inpatient   07/29/2016 0037 07/30/2016 1713 Full Code [id number]  Lonzell Emeline HERO, DO ED      Advance Directive Documentation    Flowsheet Row Most Recent Value  Type of Advance Directive Healthcare Power of Attorney  Pre-existing out of facility DNR order (yellow form or pink MOST form) --  MOST Form in Place? --      IV Access:   Peripheral IV   Procedures and diagnostic studies:   No results found.    Medical Consultants:   None.   Subjective:    Joseph [name] no complains  Objective:    Vitals:   01/16/25 1948 01/16/25 2341 01/17/25 0350 01/17/25 0459  BP: 113/84 116/83 (!) 110/92   Pulse: 86 86 91   Resp: 20 20 20    Temp: 98.4 F (36.9 C) 98.6 F (37 C) 98.7 F (37.1 C)   TempSrc: Oral Oral Oral   SpO2: 95% 98% 90%   Weight:    (!) 168 kg  Height:       SpO2: 90 % O2 Flow Rate (L/min): 2 L/min FiO2 (%): 40 %   Intake/Output Summary (Last 24 hours) at 01/17/2025 0906 Last data filed at 01/17/2025 0644 Gross per 24 hour  Intake 240 ml  Output 600 ml  Net -360  ml   Filed Weights   01/15/25 0401 01/16/25 0404 01/17/25 0459  Weight: (!) 161 kg (!) 160.9 kg (!) 168 kg    Exam: General exam: In no acute distress. Respiratory system: Good air movement and clear to auscultation. Cardiovascular system: S1 & S2 heard, RRR. No JVD. Gastrointestinal system: Abdomen is nondistended, soft and nontender.  Extremities: No pedal edema. Skin: No rashes, lesions or ulcers Psychiatry: Judgement and insight appear normal. Mood & affect appropriate.    Data Reviewed:    Labs: Basic Metabolic Panel: Recent Labs  Lab 01/11/25 0247 01/12/25 0323 01/13/25 0313 01/14/25 0139 01/15/25 0333 01/16/25 0351  NA 136 137 136 137 134* 133*  K 5.3* 4.9 5.0 5.8* 4.6 4.9  CL 99 98 100 101 98 98  CO2 23 22 24  20* 23 21*  GLUCOSE 185* 258* 223* 229* 262* 224*  BUN 81* 79* 69*  61* 62* 69*  CREATININE 3.86* 3.67* 3.05* 2.76* 2.61* 2.72*  CALCIUM  8.6* 8.8* 8.9 9.2 9.1 9.4  MG 1.8  --   --   --   --   --    GFR Estimated Creatinine Clearance: 44.8 mL/min (A) (by C-G formula based on SCr of 2.72 mg/dL (H)). Liver Function Tests: No results for input(s): AST, ALT, ALKPHOS, BILITOT, PROT, ALBUMIN  in the last 168 hours.  No results for input(s): LIPASE, AMYLASE in the last 168 hours.  No results for input(s): AMMONIA in the last 168 hours.  Coagulation profile No results for input(s): INR, PROTIME in the last 168 hours.  COVID-19 Labs  No results for input(s): DDIMER, FERRITIN, LDH, CRP in the last 72 hours.  Lab Results  Component Value Date   SARSCOV2NAA NEGATIVE 01/09/2025   SARSCOV2NAA NEGATIVE 01/07/2024   SARSCOV2NAA NEGATIVE 04/13/2022   SARSCOV2NAA POSITIVE (A) 01/06/2022    CBC: Recent Labs  Lab 01/12/25 0323 01/13/25 0313 01/14/25 0139 01/15/25 0333 01/16/25 0351  WBC 12.2* 11.5* 12.5* 13.2* 13.3*  HGB 8.9* 9.2* 10.7* 9.9* 10.1*  HCT 27.7* 28.8* 33.8* 31.6* 32.5*  MCV 83.2 83.5 85.4 84.5 84.9  PLT 400  407* 465* 512* 536*   Cardiac Enzymes: No results for input(s): CKTOTAL, CKMB, CKMBINDEX, TROPONINI in the last 168 hours. BNP (last 3 results) Recent Labs    04/27/24 1051 12/17/24 1056 01/10/25 1007  PROBNP 895* 285* 576.0*   CBG: Recent Labs  Lab 01/16/25 0618 01/16/25 1140 01/16/25 1720 01/16/25 2103 01/17/25 0605  GLUCAP 212* 165* 262* 288* 363*   D-Dimer: No results for input(s): DDIMER in the last 72 hours. Hgb A1c: No results for input(s): HGBA1C in the last 72 hours. Lipid Profile: No results for input(s): CHOL, HDL, LDLCALC, TRIG, CHOLHDL, LDLDIRECT in the last 72 hours. Thyroid function studies: No results for input(s): TSH, T4TOTAL, T3FREE, THYROIDAB in the last 72 hours.  Invalid input(s): FREET3 Anemia work up: No results for input(s): VITAMINB12, FOLATE, FERRITIN, TIBC, IRON, RETICCTPCT in the last 72 hours. Sepsis Labs: Recent Labs  Lab 01/13/25 0313 01/14/25 0139 01/15/25 0333 01/16/25 0351  WBC 11.5* 12.5* 13.2* 13.3*   Microbiology Recent Results (from the past 240 hours)  MRSA Next Gen by PCR, Nasal     Status: Abnormal   Collection Time: 01/09/25  8:24 PM   Specimen: Nasal Mucosa; Nasal Swab  Result Value Ref Range Status   MRSA by PCR Next Gen DETECTED (A) NOT DETECTED Final    Comment: RESULT CALLED TO, READ BACK BY AND VERIFIED WITH: J CRUZ REYES RN 01/09/2025 @ 2250 BY AB (NOTE) The GeneXpert MRSA Assay (FDA approved for NASAL specimens only), is one component of a comprehensive MRSA colonization surveillance program. It is not intended to diagnose MRSA infection nor to guide or monitor treatment for MRSA infections. Test performance is not FDA approved in patients less than 64 years old. Performed at [facility], [address], [place], [name] [address]   Culture, blood (Routine X 2) w Reflex to ID Panel     Status: Abnormal   Collection Time: 01/09/25  8:43 PM   Specimen:  BLOOD  Result Value Ref Range Status   Specimen Description BLOOD SITE NOT SPECIFIED  Final   Special Requests   Final    BOTTLES DRAWN AEROBIC AND ANAEROBIC Blood Culture results may not be optimal due to an inadequate volume of blood received in culture bottles   Culture  Setup Time  Final    GRAM POSITIVE RODS AEROBIC BOTTLE ONLY CRITICAL RESULT CALLED TO, READ BACK BY AND VERIFIED WITH: PHARMD A. PAYTES [id number] @ 2033 FH    Culture (A)  Final    CORYNEBACTERIUM MINUTISSIMUM Standardized susceptibility testing for this organism is not available. Performed at [facility], [address], [place], [name] [address]    Report Status 01/14/2025 FINAL  Final  Resp panel by RT-PCR (RSV, Flu A&B, Covid) Anterior Nasal Swab     Status: None   Collection Time: 01/09/25  8:46 PM   Specimen: Anterior Nasal Swab  Result Value Ref Range Status   SARS Coronavirus 2 by RT PCR NEGATIVE NEGATIVE Final   Influenza A by PCR NEGATIVE NEGATIVE Final   Influenza B by PCR NEGATIVE NEGATIVE Final    Comment: (NOTE) The Xpert Xpress SARS-CoV-2/FLU/RSV plus assay is intended as an aid in the diagnosis of influenza from Nasopharyngeal swab specimens and should not be used as a sole basis for treatment. Nasal washings and aspirates are unacceptable for Xpert Xpress SARS-CoV-2/FLU/RSV testing.  Fact Sheet for Patients: bloggercourse.com  Fact Sheet for Healthcare Providers: seriousbroker.it  This test is not yet approved or cleared by the [place]  FDA and has been authorized for detection and/or diagnosis of SARS-CoV-2 by FDA under an Emergency Use Authorization (EUA). This EUA will remain in effect (meaning this test can be used) for the duration of the COVID-19 declaration under Section 564(b)(1) of the Act, 21 U.S.C. section 360bbb-3(b)(1), unless the authorization is terminated or revoked.     Resp Syncytial Virus by PCR NEGATIVE  NEGATIVE Final    Comment: (NOTE) Fact Sheet for Patients: bloggercourse.com  Fact Sheet for Healthcare Providers: seriousbroker.it  This test is not yet approved or cleared by the [place]  FDA and has been authorized for detection and/or diagnosis of SARS-CoV-2 by FDA under an Emergency Use Authorization (EUA). This EUA will remain in effect (meaning this test can be used) for the duration of the COVID-19 declaration under Section 564(b)(1) of the Act, 21 U.S.C. section 360bbb-3(b)(1), unless the authorization is terminated or revoked.  Performed at [facility], [address] [place], [name] [address]   Respiratory (~20 pathogens) panel by PCR     Status: None   Collection Time: 01/09/25  8:46 PM   Specimen: Nasopharyngeal Swab; Respiratory  Result Value Ref Range Status   Adenovirus NOT DETECTED NOT DETECTED Final   Coronavirus 229E NOT DETECTED NOT DETECTED Final    Comment: (NOTE) The Coronavirus on the Respiratory Panel, DOES NOT test for the novel  Coronavirus (2019 nCoV)    Coronavirus HKU1 NOT DETECTED NOT DETECTED Final   Coronavirus NL63 NOT DETECTED NOT DETECTED Final   Coronavirus OC43 NOT DETECTED NOT DETECTED Final   Metapneumovirus NOT DETECTED NOT DETECTED Final   Rhinovirus / Enterovirus NOT DETECTED NOT DETECTED Final   Influenza A NOT DETECTED NOT DETECTED Final   Influenza B NOT DETECTED NOT DETECTED Final   Parainfluenza Virus 1 NOT DETECTED NOT DETECTED Final   Parainfluenza Virus 2 NOT DETECTED NOT DETECTED Final   Parainfluenza Virus 3 NOT DETECTED NOT DETECTED Final   Parainfluenza Virus 4 NOT DETECTED NOT DETECTED Final   Respiratory Syncytial Virus NOT DETECTED NOT DETECTED Final   Bordetella pertussis NOT DETECTED NOT DETECTED Final   Bordetella Parapertussis NOT DETECTED NOT DETECTED Final   Chlamydophila pneumoniae NOT DETECTED NOT DETECTED Final   Mycoplasma pneumoniae NOT  DETECTED NOT DETECTED Final  Comment: Performed at [facility], [address], [place], [name] [address]  Culture, blood (Routine X 2) w Reflex to ID Panel     Status: None   Collection Time: 01/09/25 10:40 PM   Specimen: BLOOD  Result Value Ref Range Status   Specimen Description BLOOD SITE NOT SPECIFIED  Final   Special Requests   Final    BOTTLES DRAWN AEROBIC ONLY Blood Culture results may not be optimal due to an inadequate volume of blood received in culture bottles   Culture   Final    NO GROWTH 5 DAYS Performed at [facility], [address], [place], [name] [address]    Report Status 01/14/2025 FINAL  Final     Medications:    aspirin  EC  81 mg Oral q AM   atorvastatin   80 mg Oral Daily   buPROPion   300 mg Oral q morning   Chlorhexidine  Gluconate Cloth  6 each Topical Daily   clopidogrel   75 mg Oral Daily   feeding supplement  237 mL Oral BID BM   heparin   5,000 Units Subcutaneous Q8H   insulin  aspart  0-15 Units Subcutaneous TID WC   insulin  aspart  0-5 Units Subcutaneous QHS   insulin  aspart  5 Units Subcutaneous TID WC   insulin  glargine  50 Units Subcutaneous Daily   insulin  glargine-yfgn  25 Units Subcutaneous QHS   lidocaine   2 patch Transdermal Q24H   pregabalin   150 mg Oral BID   sodium chloride  flush  3 mL Intravenous Q12H   Continuous Infusions:    LOS: 8 days   Erle Odell Castor  [facility]  01/17/2025, 9:06 AM

## 2025-01-17 NOTE — Plan of Care (Signed)
  Problem: Coping: Goal: Level of anxiety will decrease Outcome: Progressing   Problem: Nutrition: Goal: Adequate nutrition will be maintained Outcome: Progressing   

## 2025-01-18 DIAGNOSIS — R338 Other retention of urine: Secondary | ICD-10-CM | POA: Diagnosis not present

## 2025-01-18 LAB — GLUCOSE, CAPILLARY
Glucose-Capillary: 176 mg/dL — ABNORMAL HIGH (ref 70–99)
Glucose-Capillary: 277 mg/dL — ABNORMAL HIGH (ref 70–99)
Glucose-Capillary: 266 mg/dL — ABNORMAL HIGH (ref 70–99)
Glucose-Capillary: 319 mg/dL — ABNORMAL HIGH (ref 70–99)

## 2025-01-18 MED ORDER — INSULIN ASPART 100 UNIT/ML IJ SOLN
6.0000 [IU] | Freq: Three times a day (TID) | INTRAMUSCULAR | Status: AC
  Administered 2025-01-18 – 2025-01-22 (×7): 6 [IU] via SUBCUTANEOUS
  Filled 2025-01-18 (×3): qty 6
  Filled 2025-01-18 (×5): qty 1

## 2025-01-18 MED ORDER — DICLOFENAC SODIUM 1 % EX GEL
4.0000 g | Freq: Four times a day (QID) | CUTANEOUS | Status: AC | PRN
  Administered 2025-01-18 – 2025-01-22 (×2): 4 g via TOPICAL
  Filled 2025-01-18: qty 100

## 2025-01-18 NOTE — TOC Progression Note (Signed)
 Transition of Care [facility]) - Progression Note    Patient Details  Name: Joseph [name] MRN: [id number] Date of Birth: 1971-04-07  Transition of Care [facility]) CM/SW Contact  [name], [name] Phone Number: 01/18/2025, 6:04 PM  Clinical Narrative:     CSW met with patient at bedside- CSW inquired if he was discharging home or to SNF. Patient states he will more than likely need SNF. CSW encouraged the patient to make a SNF choice from the offers he was provided. Patient states he needs to talk w/his wife. CSW called his spouse - no answer. CSW left contact information w/ patient and requested to have his spouse call w/ SNF choice.   Montie Louder, MSW, LCSW Clinical Social Worker    Expected Discharge Plan: Home/Self Care Barriers to Discharge: Continued Medical Work up               Expected Discharge Plan and Services       Living arrangements for the past 2 months: Single Family Home                                       Social Drivers of Health (SDOH) Interventions SDOH Screenings   Food Insecurity: No Food Insecurity (01/11/2025)  Housing: Low Risk (01/11/2025)  Transportation Needs: Unmet Transportation Needs (01/11/2025)  Utilities: Not At Risk (01/11/2025)  Financial Resource Strain: Medium Risk (09/09/2022)  Social Connections: Moderately Isolated (03/28/2024)  Stress: No Stress Concern Present (10/10/2023)   Received from [facility]  Tobacco Use: Medium Risk (01/12/2025)    Readmission Risk Interventions    05/03/2023   11:58 AM  Readmission Risk Prevention Plan  Transportation Screening Complete  PCP or Specialist Appt within 3-5 Days Complete  HRI or Home Care Consult Complete  Social Work Consult for Recovery Care Planning/Counseling Complete  Palliative Care Screening Not Applicable  Medication Review Oceanographer) Referral to Pharmacy

## 2025-01-18 NOTE — Progress Notes (Signed)
 Physical Therapy Treatment Patient Details Name: Joseph [name] MRN: [id number] DOB: Oct 26, 1971 Today's Date: 01/18/2025   History of Present Illness Pt is a 54 y.o. male who presented 01/09/25 with chest pain. EKG showed ST changes in the anterior leads so he was taken to the cath lab, cath showed no acute lesion. Admitted with acute metabolic encephalopathy, hyperglycemia, hyperkalemia, hyponatremia, AKI on CKD, SIRS. PMH: asthma, COPD, chronic bronchitis, morbid obesity, OSA, diabetes, GERD, HTN, HLD, PE, DVT on Xarelto , PAD s/p bilateral BKA, CAD s/p CABG x4, aneurysm of ascending aorta, arthritis, DM2, MI, PE    PT Comments  Pt resting in bed on arrival, pleasant and agreeable to session. Pt able to come to sitting EOB with CGA for safety and maintain sitting balance without assist throughout session. Pt performing seated exercises for increased LE ROM and strength maintenance. Pt performing weight shifting anterior/posterior and laterally outside BOS to increase dynamic sitting balance. Pt declining transfer to chair however remaining seated up EOB at end of session with all needs met. Encouraged pt to be up to EOB throughout weekend and between therapies as tolerated. Pt continues to benefit from skilled PT services to progress toward functional mobility goals.      If plan is discharge home, recommend the following: Two people to help with walking and/or transfers;Two people to help with bathing/dressing/bathroom;Assistance with cooking/housework;Assistance with feeding;Direct supervision/assist for medications management;Direct supervision/assist for financial management;Assist for transportation;Help with stairs or ramp for entrance;Supervision due to cognitive status   Can travel by private vehicle     No  Equipment Recommendations  Wheelchair cushion (measurements PT);Hospital bed;Hoyer lift (roho cushion, air mattress overlay, bil BKA hoyer lift pad)    Recommendations for Other  Services       Precautions / Restrictions Precautions Precautions: Fall;Other (comment) Precaution/Restrictions Comments: hypersensitive to all touch, esp L shoulder; chronic bil BKA; wounds reported on buttocks and noted at BKA Restrictions Weight Bearing Restrictions Per Provider Order: No     Mobility  Bed Mobility Overal bed mobility: Needs Assistance Bed Mobility: Supine to Sit     Supine to sit: Contact guard     General bed mobility comments: pt able to some to sit EOB with use of momentum and walking LEs to EOB    Transfers                        Ambulation/Gait               General Gait Details: n/a   Stairs             Wheelchair Mobility     Tilt Bed    Modified Rankin (Stroke Patients Only)       Balance Overall balance assessment: Needs assistance Sitting-balance support: Feet unsupported, No upper extremity supported Sitting balance-Leahy Scale: Good Sitting balance - Comments: CGA for safety with scooting in sitting                                    Communication Communication Communication: No apparent difficulties  Cognition Arousal: Alert Behavior During Therapy: WFL for tasks assessed/performed   PT - Cognitive impairments: History of cognitive impairments                         Following commands: Impaired Following commands impaired: Follows one step commands with increased time  Cueing Cueing Techniques: Verbal cues, Tactile cues, Gestural cues  Exercises General Exercises - Lower Extremity Long Arc Quad: AROM, Both, 20 reps, Seated Hip ABduction/ADduction: AROM, Both, 10 reps, Seated Other Exercises Other Exercises: worked on weight shifting outside BOS ant/posterior and laterally for improved sitting balance    General Comments General comments (skin integrity, edema, etc.): VSS on RA      Pertinent Vitals/Pain Pain Assessment Pain Assessment: Faces Faces Pain  Scale: Hurts little more Pain Location: B shoulders Pain Descriptors / Indicators: Discomfort, Grimacing, Guarding, Moaning Pain Intervention(s): Monitored during session, Limited activity within patient's tolerance    Home Living                          Prior Function            PT Goals (current goals can now be found in the care plan section) Acute Rehab PT Goals Patient Stated Goal: to reduce pain PT Goal Formulation: With patient/family Time For Goal Achievement: 01/26/25 Progress towards PT goals: Progressing toward goals    Frequency    Min 2X/week      PT Plan      Co-evaluation              AM-PAC PT 6 Clicks Mobility   Outcome Measure  Help needed turning from your back to your side while in a flat bed without using bedrails?: A Lot Help needed moving from lying on your back to sitting on the side of a flat bed without using bedrails?: A Lot Help needed moving to and from a bed to a chair (including a wheelchair)?: Total Help needed standing up from a chair using your arms (e.g., wheelchair or bedside chair)?: Total Help needed to walk in hospital room?: Total Help needed climbing 3-5 steps with a railing? : Total 6 Click Score: 8    End of Session   Activity Tolerance: Patient limited by pain Patient left: in bed;with call bell/phone within reach (seated up EOB) Nurse Communication: Mobility status PT Visit Diagnosis: Muscle weakness (generalized) (M62.81);Difficulty in walking, not elsewhere classified (R26.2);Pain;History of falling (Z91.81) Pain - Right/Left:  (bil) Pain - part of body:  (generalized)     Time: 8490-8473 PT Time Calculation (min) (ACUTE ONLY): 17 min  Charges:    $Therapeutic Activity: 8-22 mins PT General Charges $$ ACUTE PT VISIT: 1 Visit                     Ayanah Snader R. PTA Acute Rehabilitation Services Office: [phone]   Therisa CHRISTELLA Boor 01/18/2025, 4:26 PM

## 2025-01-18 NOTE — Progress Notes (Signed)
 [facility] PROGRESS NOTE    Progress Note  Joseph [name]  FMW:[id number] DOB: 1971-03-11 DOA: 01/09/2025 PCP: Alec House, MD     Brief Narrative:   Joseph [name] is an 54 y.o. male past medical history of asthma COPD diabetes mellitus type 2, essential hypertension, PE/DVT on Xarelto , history of PAD status post bilateral BKA, history of CAD with a history of CABG who comes into the ED for chest pain initially admitted to the ICU on bilevel as he was unable to protect his airway with toxic metabolic encephalopathy and acute urinary retention.  Patient was complaining of pain diffusely improved with home medications including Lyrica    Assessment/Plan:   Acute metabolic encephalopathy: Likely due to uremia, CO2 narcosis and possible DKA there was also question about polypharmacy. Improved with holding medication and metabolic derangement. PT evaluated the patient he continues to be weak requiring 2 person assist, will need skilled nursing facility placement.  Multiple complaints of pain: CT cervical spine was unrevealing. Started back on her medication currently pain-free.  Hyperkalemia: Resolved with IV fluids and Lokelma .  Acute kidney injury on chronic kidney disease stage IIIb/anion gap metabolic acidosis: Multifactorial in the setting of urinary retention, hemodynamically mediated and recent contrast exposure Nephrology was consulted recommended conservative management, creatinine has returned to baseline.  Insulin -dependent diabetes mellitus type 2: Currently on long-acting insulin  plus sliding scale. Leukos went up to 300 increase long-acting insulin  to 50 units twice a day. Last A1c of 7.9.  Sepsis is clinically supported  of right leg stump: 1/2 blood cultures was positive likely a contaminant. Will complete oral antibiotics Augmentin  and linezolid  for 10-day course.  COPD: He has been weaned to room air.  History of CABG: There was a concern for  anginal pain underwent cardiac cath which was unrevealing, doubt PE as he is not hypoxic. Continue aspirin  and Plavix .  History of DVT/PE: Not on a DOAC at home remote history of PE in 2014.  Depression/anxiety: Continue Wellbutrin  and Lexapro .  Chronic pain: Continue current medication.  Morbid obesity. Noted.  Narcotic seeking behavior: He has understanding.  Will continue oral narcotics for pain control.   DVT prophylaxis: lovenox  Family Communication:none Status is: Inpatient Remains inpatient appropriate because: Acute metabolic encephalopathy currently awaiting placement.    Code Status:     Code Status Orders  (From admission, onward)           Start     Ordered   01/09/25 2045  Full code  Continuous       Question:  By:  Answer:  Default: patient does not have capacity for decision making, no surrogate or prior directive available   01/09/25 2046           Code Status History     Date Active Date Inactive Code Status Order ID Comments User Context   01/09/2025 2018 01/09/2025 2046 Full Code [id number]  Verlin Lonni BIRCH, MD Inpatient   03/28/2024 0139 04/06/2024 1725 Full Code [id number]  Shona Johnmark SAILOR, DO ED   01/07/2024 0304 01/16/2024 1750 Full Code [id number]  Lonzell Emeline HERO, DO ED   11/26/2023 1323 12/03/2023 1921 Full Code [id number]  Lou Claretta HERO, MD ED   04/26/2023 1500 05/03/2023 2013 Full Code [id number]  Bryan Bianchi, MD ED   06/30/2022 1924 07/14/2022 0226 Full Code [id number]  Seena Marsa NOVAK, MD ED   06/17/2022 1124 06/17/2022 1826 Full Code [id number]  Burnard Debby LABOR, MD Inpatient   04/14/2022 0015 04/16/2022 1806 Full Code  [id number]  Lenda Garre, MD ED   01/06/2022 1533 01/14/2022 2339 Full Code [id number]  Letha Savant, DO ED   11/18/2021 0420 11/26/2021 1809 Full Code [id number]  Alfornia Madison, MD ED   08/15/2021 1720 08/20/2021 2357 Full Code [id number]  Jerrie Anger, PA ED   07/17/2021 0452 07/17/2021 1913 Full Code  [id number]  Manfred Driver, DO ED   07/10/2021 0359 07/13/2021 2204 Full Code [id number]  Zierle-Ghosh, Asia B, DO ED   04/11/2021 2203 04/14/2021 2225 Full Code [id number]  Barbra Lang PARAS, DO Inpatient   03/05/2021 0029 03/15/2021 0018 Full Code [id number]  Selene Henderson, MD ED   02/18/2021 1902 02/19/2021 2233 Full Code [id number]  Barbaraann Katz, MD ED   10/10/2020 1838 10/11/2020 1626 Full Code [id number]  Harvey Carlin BRAVO, MD Inpatient   07/24/2020 1641 07/31/2020 1933 Full Code [id number]  Lindia Laurel MATSU, PA-C Inpatient   07/18/2020 0354 07/24/2020 1641 Full Code [id number]  Manfred Driver, DO ED   11/01/2019 1929 11/03/2019 1930 Full Code [id number]  Henry Manuelita NOVAK, NP ED   05/30/2019 1958 05/31/2019 1623 Full Code [id number]  Claudene Victory ORN, MD Inpatient   05/30/2019 1323 05/30/2019 1958 Full Code [id number]  Claudene Victory ORN, MD Inpatient   09/16/2017 1825 09/23/2017 1942 Full Code [id number]  Evonnie Lenis, MD Inpatient   07/29/2016 0037 07/30/2016 1713 Full Code [id number]  Lonzell Emeline HERO, DO ED      Advance Directive Documentation    Flowsheet Row Most Recent Value  Type of Advance Directive Healthcare Power of Attorney  Pre-existing out of facility DNR order (yellow form or pink MOST form) --  MOST Form in Place? --      IV Access:   Peripheral IV   Procedures and diagnostic studies:   No results found.    Medical Consultants:   None.   Subjective:    Early Steel Estala no complains  Objective:    Vitals:   01/17/25 1945 01/17/25 2331 01/18/25 0354 01/18/25 0623  BP: 112/82 107/86 115/81   Pulse: 97 96 93   Resp: 20 20 20 19   Temp: 99.1 F (37.3 C) 98.8 F (37.1 C) 98.7 F (37.1 C)   TempSrc: Oral Oral Oral   SpO2: 97% 98% 96%   Weight:    (!) 164 kg  Height:       SpO2: 96 % O2 Flow Rate (L/min): 2 L/min FiO2 (%): 40 %   Intake/Output Summary (Last 24 hours) at 01/18/2025 0934 Last data filed at 01/18/2025 0640 Gross per 24 hour  Intake 550 ml  Output 750 ml   Net -200 ml   Filed Weights   01/16/25 0404 01/17/25 0459 01/18/25 0623  Weight: (!) 160.9 kg (!) 168 kg (!) 164 kg    Exam: General exam: In no acute distress. Respiratory system: Good air movement and clear to auscultation. Cardiovascular system: S1 & S2 heard, RRR. No JVD. Gastrointestinal system: Abdomen is nondistended, soft and nontender.  Extremities: No pedal edema. Skin: No rashes, lesions or ulcers Psychiatry: Judgement and insight appear normal. Mood & affect appropriate.    Data Reviewed:    Labs: Basic Metabolic Panel: Recent Labs  Lab 01/12/25 0323 01/13/25 0313 01/14/25 0139 01/15/25 0333 01/16/25 0351  NA 137 136 137 134* 133*  K 4.9 5.0 5.8* 4.6 4.9  CL 98 100 101 98 98  CO2 22 24 20* 23 21*  GLUCOSE 258* 223* 229* 262* 224*  BUN 79* 69* 61* 62* 69*  CREATININE 3.67* 3.05*  2.76* 2.61* 2.72*  CALCIUM  8.8* 8.9 9.2 9.1 9.4   GFR Estimated Creatinine Clearance: 44.1 mL/min (A) (by C-G formula based on SCr of 2.72 mg/dL (H)). Liver Function Tests: No results for input(s): AST, ALT, ALKPHOS, BILITOT, PROT, ALBUMIN  in the last 168 hours.  No results for input(s): LIPASE, AMYLASE in the last 168 hours.  No results for input(s): AMMONIA in the last 168 hours.  Coagulation profile No results for input(s): INR, PROTIME in the last 168 hours.  COVID-19 Labs  No results for input(s): DDIMER, FERRITIN, LDH, CRP in the last 72 hours.  Lab Results  Component Value Date   SARSCOV2NAA NEGATIVE 01/09/2025   SARSCOV2NAA NEGATIVE 01/07/2024   SARSCOV2NAA NEGATIVE 04/13/2022   SARSCOV2NAA POSITIVE (A) 01/06/2022    CBC: Recent Labs  Lab 01/12/25 0323 01/13/25 0313 01/14/25 0139 01/15/25 0333 01/16/25 0351  WBC 12.2* 11.5* 12.5* 13.2* 13.3*  HGB 8.9* 9.2* 10.7* 9.9* 10.1*  HCT 27.7* 28.8* 33.8* 31.6* 32.5*  MCV 83.2 83.5 85.4 84.5 84.9  PLT 400 407* 465* 512* 536*   Cardiac Enzymes: No results for input(s):  CKTOTAL, CKMB, CKMBINDEX, TROPONINI in the last 168 hours. BNP (last 3 results) Recent Labs    04/27/24 1051 12/17/24 1056 01/10/25 1007  PROBNP 895* 285* 576.0*   CBG: Recent Labs  Lab 01/17/25 0605 01/17/25 1211 01/17/25 1639 01/17/25 2118 01/18/25 0626  GLUCAP 363* 288* 172* 182* 176*   D-Dimer: No results for input(s): DDIMER in the last 72 hours. Hgb A1c: No results for input(s): HGBA1C in the last 72 hours. Lipid Profile: No results for input(s): CHOL, HDL, LDLCALC, TRIG, CHOLHDL, LDLDIRECT in the last 72 hours. Thyroid function studies: No results for input(s): TSH, T4TOTAL, T3FREE, THYROIDAB in the last 72 hours.  Invalid input(s): FREET3 Anemia work up: No results for input(s): VITAMINB12, FOLATE, FERRITIN, TIBC, IRON, RETICCTPCT in the last 72 hours. Sepsis Labs: Recent Labs  Lab 01/13/25 0313 01/14/25 0139 01/15/25 0333 01/16/25 0351  WBC 11.5* 12.5* 13.2* 13.3*   Microbiology Recent Results (from the past 240 hours)  MRSA Next Gen by PCR, Nasal     Status: Abnormal   Collection Time: 01/09/25  8:24 PM   Specimen: Nasal Mucosa; Nasal Swab  Result Value Ref Range Status   MRSA by PCR Next Gen DETECTED (A) NOT DETECTED Final    Comment: RESULT CALLED TO, READ BACK BY AND VERIFIED WITH: J CRUZ REYES RN 01/09/2025 @ 2250 BY AB (NOTE) The GeneXpert MRSA Assay (FDA approved for NASAL specimens only), is one component of a comprehensive MRSA colonization surveillance program. It is not intended to diagnose MRSA infection nor to guide or monitor treatment for MRSA infections. Test performance is not FDA approved in patients less than 44 years old. Performed at [facility], [address] [place], [name] [address]   Culture, blood (Routine X 2) w Reflex to ID Panel     Status: Abnormal   Collection Time: 01/09/25  8:43 PM   Specimen: BLOOD  Result Value Ref Range Status   Specimen Description BLOOD  SITE NOT SPECIFIED  Final   Special Requests   Final    BOTTLES DRAWN AEROBIC AND ANAEROBIC Blood Culture results may not be optimal due to an inadequate volume of blood received in culture bottles   Culture  Setup Time   Final    GRAM POSITIVE RODS AEROBIC BOTTLE ONLY CRITICAL RESULT CALLED TO, READ BACK BY AND VERIFIED WITH: PHARMD A. PAYTES [id number] @ 2033  FH    Culture (A)  Final    CORYNEBACTERIUM MINUTISSIMUM Standardized susceptibility testing for this organism is not available. Performed at [facility], [address], [place], [name] [address]    Report Status 01/14/2025 FINAL  Final  Resp panel by RT-PCR (RSV, Flu A&B, Covid) Anterior Nasal Swab     Status: None   Collection Time: 01/09/25  8:46 PM   Specimen: Anterior Nasal Swab  Result Value Ref Range Status   SARS Coronavirus 2 by RT PCR NEGATIVE NEGATIVE Final   Influenza A by PCR NEGATIVE NEGATIVE Final   Influenza B by PCR NEGATIVE NEGATIVE Final    Comment: (NOTE) The Xpert Xpress SARS-CoV-2/FLU/RSV plus assay is intended as an aid in the diagnosis of influenza from Nasopharyngeal swab specimens and should not be used as a sole basis for treatment. Nasal washings and aspirates are unacceptable for Xpert Xpress SARS-CoV-2/FLU/RSV testing.  Fact Sheet for Patients: bloggercourse.com  Fact Sheet for Healthcare Providers: seriousbroker.it  This test is not yet approved or cleared by the [place]  FDA and has been authorized for detection and/or diagnosis of SARS-CoV-2 by FDA under an Emergency Use Authorization (EUA). This EUA will remain in effect (meaning this test can be used) for the duration of the COVID-19 declaration under Section 564(b)(1) of the Act, 21 U.S.C. section 360bbb-3(b)(1), unless the authorization is terminated or revoked.     Resp Syncytial Virus by PCR NEGATIVE NEGATIVE Final    Comment: (NOTE) Fact Sheet for  Patients: bloggercourse.com  Fact Sheet for Healthcare Providers: seriousbroker.it  This test is not yet approved or cleared by the [place]  FDA and has been authorized for detection and/or diagnosis of SARS-CoV-2 by FDA under an Emergency Use Authorization (EUA). This EUA will remain in effect (meaning this test can be used) for the duration of the COVID-19 declaration under Section 564(b)(1) of the Act, 21 U.S.C. section 360bbb-3(b)(1), unless the authorization is terminated or revoked.  Performed at [facility], [address], [place], [name] [address]   Respiratory (~20 pathogens) panel by PCR     Status: None   Collection Time: 01/09/25  8:46 PM   Specimen: Nasopharyngeal Swab; Respiratory  Result Value Ref Range Status   Adenovirus NOT DETECTED NOT DETECTED Final   Coronavirus 229E NOT DETECTED NOT DETECTED Final    Comment: (NOTE) The Coronavirus on the Respiratory Panel, DOES NOT test for the novel  Coronavirus (2019 nCoV)    Coronavirus HKU1 NOT DETECTED NOT DETECTED Final   Coronavirus NL63 NOT DETECTED NOT DETECTED Final   Coronavirus OC43 NOT DETECTED NOT DETECTED Final   Metapneumovirus NOT DETECTED NOT DETECTED Final   Rhinovirus / Enterovirus NOT DETECTED NOT DETECTED Final   Influenza A NOT DETECTED NOT DETECTED Final   Influenza B NOT DETECTED NOT DETECTED Final   Parainfluenza Virus 1 NOT DETECTED NOT DETECTED Final   Parainfluenza Virus 2 NOT DETECTED NOT DETECTED Final   Parainfluenza Virus 3 NOT DETECTED NOT DETECTED Final   Parainfluenza Virus 4 NOT DETECTED NOT DETECTED Final   Respiratory Syncytial Virus NOT DETECTED NOT DETECTED Final   Bordetella pertussis NOT DETECTED NOT DETECTED Final   Bordetella Parapertussis NOT DETECTED NOT DETECTED Final   Chlamydophila pneumoniae NOT DETECTED NOT DETECTED Final   Mycoplasma pneumoniae NOT DETECTED NOT DETECTED Final    Comment: Performed at  [facility], [address] [place], [name] [address]  Culture, blood (Routine X 2) w Reflex to  ID Panel     Status: None   Collection Time: 01/09/25 10:40 PM   Specimen: BLOOD  Result Value Ref Range Status   Specimen Description BLOOD SITE NOT SPECIFIED  Final   Special Requests   Final    BOTTLES DRAWN AEROBIC ONLY Blood Culture results may not be optimal due to an inadequate volume of blood received in culture bottles   Culture   Final    NO GROWTH 5 DAYS Performed at [facility], [address], [place], [name] [address]    Report Status 01/14/2025 FINAL  Final     Medications:    aspirin  EC  81 mg Oral q AM   atorvastatin   80 mg Oral Daily   buPROPion   300 mg Oral q morning   Chlorhexidine  Gluconate Cloth  6 each Topical Daily   clopidogrel   75 mg Oral Daily   feeding supplement  237 mL Oral BID BM   heparin   5,000 Units Subcutaneous Q8H   insulin  aspart  0-15 Units Subcutaneous TID WC   insulin  aspart  0-5 Units Subcutaneous QHS   insulin  aspart  5 Units Subcutaneous TID WC   insulin  glargine  50 Units Subcutaneous BID   lidocaine   2 patch Transdermal Q24H   pregabalin   150 mg Oral BID   sodium chloride  flush  3 mL Intravenous Q12H   Continuous Infusions:    LOS: 9 days   Erle Odell Castor  [facility]  01/18/2025, 9:34 AM

## 2025-01-19 DIAGNOSIS — R338 Other retention of urine: Secondary | ICD-10-CM | POA: Diagnosis not present

## 2025-01-19 DIAGNOSIS — G9341 Metabolic encephalopathy: Secondary | ICD-10-CM | POA: Diagnosis not present

## 2025-01-19 LAB — GLUCOSE, CAPILLARY
Glucose-Capillary: 278 mg/dL — ABNORMAL HIGH (ref 70–99)
Glucose-Capillary: 299 mg/dL — ABNORMAL HIGH (ref 70–99)
Glucose-Capillary: 241 mg/dL — ABNORMAL HIGH (ref 70–99)
Glucose-Capillary: 269 mg/dL — ABNORMAL HIGH (ref 70–99)

## 2025-01-19 MED ORDER — INSULIN GLARGINE 100 UNIT/ML ~~LOC~~ SOLN
60.0000 [IU] | Freq: Two times a day (BID) | SUBCUTANEOUS | Status: AC
  Administered 2025-01-19 – 2025-01-25 (×14): 60 [IU] via SUBCUTANEOUS
  Filled 2025-01-19 (×16): qty 0.6

## 2025-01-19 NOTE — TOC Progression Note (Signed)
 Transition of Care [facility]) - Progression Note    Patient Details  Name: Joseph [name] MRN: [id number] Date of Birth: 03/17/1971  Transition [facility]) CM/SW Contact  [name], [name] Phone Number: 01/19/2025, 10:55 AM  Clinical Narrative:     CSW spoke with patient's spouse, [name] by phone- she selected [facility]  [facility] confirmed they can accept patient beginning of the week - pending weather-   Insurance authorization pending - reference # [id number]  TOC wil continue to follow and assist with discharge planning.   Montie Louder, MSW, LCSW Clinical Social Worker    Expected Discharge Plan: Skilled Nursing Facility Barriers to Discharge: Insurance Authorization               Expected Discharge Plan and Services       Living arrangements for the past 2 months: Single Family Home                                       Social Drivers of Health (SDOH) Interventions SDOH Screenings   Food Insecurity: No Food Insecurity (01/11/2025)  Housing: Low Risk (01/11/2025)  Transportation Needs: Unmet Transportation Needs (01/11/2025)  Utilities: Not At Risk (01/11/2025)  Financial Resource Strain: Medium Risk (09/09/2022)  Social Connections: Moderately Isolated (03/28/2024)  Stress: No Stress Concern Present (10/10/2023)   Received from [facility]  Tobacco Use: Medium Risk (01/12/2025)    Readmission Risk Interventions    05/03/2023   11:58 AM  Readmission Risk Prevention Plan  Transportation Screening Complete  PCP or Specialist Appt within 3-5 Days Complete  HRI or Home Care Consult Complete  Social Work Consult for Recovery Care Planning/Counseling Complete  Palliative Care Screening Not Applicable  Medication Review Oceanographer) Referral to Pharmacy

## 2025-01-19 NOTE — Progress Notes (Signed)
 [facility] PROGRESS NOTE    Progress Note  Joseph [name]  FMW:[id number] DOB: Feb 24, 1971 DOA: 01/09/2025 PCP: Alec House, MD     Brief Narrative:   Joseph [name] is an 54 y.o. male past medical history of asthma COPD diabetes mellitus type 2, essential hypertension, PE/DVT on Xarelto , history of PAD status post bilateral BKA, history of CAD with a history of CABG who comes into the ED for chest pain initially admitted to the ICU on bilevel as he was unable to protect his airway with toxic metabolic encephalopathy and acute urinary retention.  Patient was complaining of pain diffusely improved with home medications including Lyrica . Assessment/Plan:   Acute metabolic encephalopathy: Likely due to uremia, CO2 narcosis and possible DKA there was also question about polypharmacy. Improved with holding medication and metabolic derangement. PT evaluated the patient he continues to be weak requiring 2 person assist, will need skilled nursing facility placement. Discussed with patient and wife for trying to decide about a skilled nursing facility.  Multiple complaints of pain: CT cervical spine was unrevealing. Started back on her medication currently pain-free.  Hyperkalemia: Resolved with IV fluids and Lokelma .  Acute kidney injury on chronic kidney disease stage IIIb/anion gap metabolic acidosis: Multifactorial in the setting of urinary retention, hemodynamically mediated and recent contrast exposure Nephrology was consulted recommended conservative management, creatinine has returned to baseline.  Insulin -dependent diabetes mellitus type 2: Currently on long-acting insulin  plus sliding scale. Glucose still elevated increase long-acting insulin  continue sliding scale. Last A1c of 7.9.  Sepsis is clinically supported  of right leg stump: 1/2 blood cultures was positive likely a contaminant. Will complete oral antibiotics Augmentin  and linezolid  for 10-day  course.  COPD: He has been weaned to room air.  History of CABG: There was a concern for anginal pain underwent cardiac cath which was unrevealing, doubt PE as he is not hypoxic. Continue aspirin  and Plavix .  History of DVT/PE: Not on a DOAC at home remote history of PE in 2014.  Depression/anxiety: Continue Wellbutrin  and Lexapro .  Chronic pain: Continue current medication.  Morbid obesity. Noted.  Narcotic seeking behavior: He has understanding.  Will continue oral narcotics for pain control.   DVT prophylaxis: lovenox  Family Communication:none Status is: Inpatient Remains inpatient appropriate because: Acute metabolic encephalopathy currently awaiting placement.    Code Status:     Code Status Orders  (From admission, onward)           Start     Ordered   01/09/25 2045  Full code  Continuous       Question:  By:  Answer:  Default: patient does not have capacity for decision making, no surrogate or prior directive available   01/09/25 2046           Code Status History     Date Active Date Inactive Code Status Order ID Comments User Context   01/09/2025 2018 01/09/2025 2046 Full Code [id number]  Verlin Lonni BIRCH, MD Inpatient   03/28/2024 0139 04/06/2024 1725 Full Code [id number]  Shona Doryan SAILOR, DO ED   01/07/2024 0304 01/16/2024 1750 Full Code [id number]  Lonzell Emeline HERO, DO ED   11/26/2023 1323 12/03/2023 1921 Full Code [id number]  Lou Claretta HERO, MD ED   04/26/2023 1500 05/03/2023 2013 Full Code [id number]  Bryan Bianchi, MD ED   06/30/2022 1924 07/14/2022 0226 Full Code [id number]  Seena Marsa NOVAK, MD ED   06/17/2022 1124 06/17/2022 1826 Full Code [id number]  Burnard Debby LABOR, MD Inpatient  04/14/2022 0015 04/16/2022 1806 Full Code [id number]  Lenda Garre, MD ED   01/06/2022 1533 01/14/2022 2339 Full Code [id number]  Autry-Lott, Rojean, DO ED   11/18/2021 0420 11/26/2021 1809 Full Code [id number]  Alfornia Madison, MD ED   08/15/2021 1720 08/20/2021  2357 Full Code [id number]  Jerrie Anger, PA ED   07/17/2021 0452 07/17/2021 1913 Full Code [id number]  Manfred Driver, DO ED   07/10/2021 0359 07/13/2021 2204 Full Code [id number]  Zierle-Ghosh, Asia B, DO ED   04/11/2021 2203 04/14/2021 2225 Full Code [id number]  Barbra Lang PARAS, DO Inpatient   03/05/2021 0029 03/15/2021 0018 Full Code [id number]  Selene Henderson, MD ED   02/18/2021 1902 02/19/2021 2233 Full Code [id number]  Barbaraann Katz, MD ED   10/10/2020 1838 10/11/2020 1626 Full Code [id number]  Harvey Carlin BRAVO, MD Inpatient   07/24/2020 1641 07/31/2020 1933 Full Code [id number]  Lindia Laurel MATSU, PA-C Inpatient   07/18/2020 0354 07/24/2020 1641 Full Code [id number]  Manfred Driver, DO ED   11/01/2019 1929 11/03/2019 1930 Full Code [id number]  Henry Manuelita NOVAK, NP ED   05/30/2019 1958 05/31/2019 1623 Full Code [id number]  Claudene Victory ORN, MD Inpatient   05/30/2019 1323 05/30/2019 1958 Full Code [id number]  Claudene Victory ORN, MD Inpatient   09/16/2017 1825 09/23/2017 1942 Full Code [id number]  Evonnie Lenis, MD Inpatient   07/29/2016 0037 07/30/2016 1713 Full Code [id number]  Lonzell Emeline HERO, DO ED      Advance Directive Documentation    Flowsheet Row Most Recent Value  Type of Advance Directive Healthcare Power of Attorney  Pre-existing out of facility DNR order (yellow form or pink MOST form) --  MOST Form in Place? --      IV Access:   Peripheral IV   Procedures and diagnostic studies:   No results found.    Medical Consultants:   None.   Subjective:    Joseph [name] no complaints today.  Objective:    Vitals:   01/19/25 0330 01/19/25 0334 01/19/25 0500 01/19/25 0805  BP: (!) 124/96   (!) 110/91  Pulse:    92  Resp: 14   14  Temp:  97.7 F (36.5 C)  98.6 F (37 C)  TempSrc:  Oral  Oral  SpO2:  94%  96%  Weight:   (!) 164 kg   Height:       SpO2: 96 % O2 Flow Rate (L/min): 2 L/min FiO2 (%): 40 %   Intake/Output Summary (Last 24 hours) at 01/19/2025 0850 Last data  filed at 01/18/2025 2000 Gross per 24 hour  Intake 120 ml  Output --  Net 120 ml   Filed Weights   01/17/25 0459 01/18/25 0623 01/19/25 0500  Weight: (!) 168 kg (!) 164 kg (!) 164 kg    Exam: General exam: In no acute distress. Respiratory system: Good air movement and clear to auscultation. Cardiovascular system: S1 & S2 heard, RRR. No JVD. Gastrointestinal system: Abdomen is nondistended, soft and nontender.  Extremities: No pedal edema. Skin: No rashes, lesions or ulcers Psychiatry: Judgement and insight appear normal. Mood & affect appropriate.    Data Reviewed:    Labs: Basic Metabolic Panel: Recent Labs  Lab 01/13/25 0313 01/14/25 0139 01/15/25 0333 01/16/25 0351  NA 136 137 134* 133*  K 5.0 5.8* 4.6 4.9  CL 100 101 98 98  CO2 24 20* 23 21*  GLUCOSE 223* 229* 262* 224*  BUN 69* 61* 62* 69*  CREATININE 3.05* 2.76* 2.61* 2.72*  CALCIUM  8.9 9.2 9.1 9.4   GFR Estimated Creatinine Clearance: 44.1 mL/min (A) (by C-G formula based on SCr of 2.72 mg/dL (H)). Liver Function Tests: No results for input(s): AST, ALT, ALKPHOS, BILITOT, PROT, ALBUMIN  in the last 168 hours.  No results for input(s): LIPASE, AMYLASE in the last 168 hours.  No results for input(s): AMMONIA in the last 168 hours.  Coagulation profile No results for input(s): INR, PROTIME in the last 168 hours.  COVID-19 Labs  No results for input(s): DDIMER, FERRITIN, LDH, CRP in the last 72 hours.  Lab Results  Component Value Date   SARSCOV2NAA NEGATIVE 01/09/2025   SARSCOV2NAA NEGATIVE 01/07/2024   SARSCOV2NAA NEGATIVE 04/13/2022   SARSCOV2NAA POSITIVE (A) 01/06/2022    CBC: Recent Labs  Lab 01/13/25 0313 01/14/25 0139 01/15/25 0333 01/16/25 0351  WBC 11.5* 12.5* 13.2* 13.3*  HGB 9.2* 10.7* 9.9* 10.1*  HCT 28.8* 33.8* 31.6* 32.5*  MCV 83.5 85.4 84.5 84.9  PLT 407* 465* 512* 536*   Cardiac Enzymes: No results for input(s): CKTOTAL, CKMB,  CKMBINDEX, TROPONINI in the last 168 hours. BNP (last 3 results) Recent Labs    04/27/24 1051 12/17/24 1056 01/10/25 1007  PROBNP 895* 285* 576.0*   CBG: Recent Labs  Lab 01/18/25 0626 01/18/25 1313 01/18/25 1636 01/18/25 2107 01/19/25 0613  GLUCAP 176* 277* 266* 319* 278*   D-Dimer: No results for input(s): DDIMER in the last 72 hours. Hgb A1c: No results for input(s): HGBA1C in the last 72 hours. Lipid Profile: No results for input(s): CHOL, HDL, LDLCALC, TRIG, CHOLHDL, LDLDIRECT in the last 72 hours. Thyroid function studies: No results for input(s): TSH, T4TOTAL, T3FREE, THYROIDAB in the last 72 hours.  Invalid input(s): FREET3 Anemia work up: No results for input(s): VITAMINB12, FOLATE, FERRITIN, TIBC, IRON, RETICCTPCT in the last 72 hours. Sepsis Labs: Recent Labs  Lab 01/13/25 0313 01/14/25 0139 01/15/25 0333 01/16/25 0351  WBC 11.5* 12.5* 13.2* 13.3*   Microbiology Recent Results (from the past 240 hours)  MRSA Next Gen by PCR, Nasal     Status: Abnormal   Collection Time: 01/09/25  8:24 PM   Specimen: Nasal Mucosa; Nasal Swab  Result Value Ref Range Status   MRSA by PCR Next Gen DETECTED (A) NOT DETECTED Final    Comment: RESULT CALLED TO, READ BACK BY AND VERIFIED WITH: J CRUZ REYES RN 01/09/2025 @ 2250 BY AB (NOTE) The GeneXpert MRSA Assay (FDA approved for NASAL specimens only), is one component of a comprehensive MRSA colonization surveillance program. It is not intended to diagnose MRSA infection nor to guide or monitor treatment for MRSA infections. Test performance is not FDA approved in patients less than 16 years old. Performed at [facility], [address], [place], [name] [address]   Culture, blood (Routine X 2) w Reflex to ID Panel     Status: Abnormal   Collection Time: 01/09/25  8:43 PM   Specimen: BLOOD  Result Value Ref Range Status   Specimen Description BLOOD SITE NOT SPECIFIED   Final   Special Requests   Final    BOTTLES DRAWN AEROBIC AND ANAEROBIC Blood Culture results may not be optimal due to an inadequate volume of blood received in culture bottles   Culture  Setup Time   Final    GRAM POSITIVE RODS AEROBIC BOTTLE ONLY CRITICAL RESULT CALLED TO, READ BACK BY AND VERIFIED WITH: PHARMD A. PAYTES [id number] @ 2033 FH    Culture (A)  Final    CORYNEBACTERIUM  MINUTISSIMUM Standardized susceptibility testing for this organism is not available. Performed at [facility], [address] [place], [name] [address]    Report Status 01/14/2025 FINAL  Final  Resp panel by RT-PCR (RSV, Flu A&B, Covid) Anterior Nasal Swab     Status: None   Collection Time: 01/09/25  8:46 PM   Specimen: Anterior Nasal Swab  Result Value Ref Range Status   SARS Coronavirus 2 by RT PCR NEGATIVE NEGATIVE Final   Influenza A by PCR NEGATIVE NEGATIVE Final   Influenza B by PCR NEGATIVE NEGATIVE Final    Comment: (NOTE) The Xpert Xpress SARS-CoV-2/FLU/RSV plus assay is intended as an aid in the diagnosis of influenza from Nasopharyngeal swab specimens and should not be used as a sole basis for treatment. Nasal washings and aspirates are unacceptable for Xpert Xpress SARS-CoV-2/FLU/RSV testing.  Fact Sheet for Patients: bloggercourse.com  Fact Sheet for Healthcare Providers: seriousbroker.it  This test is not yet approved or cleared by the [place]  FDA and has been authorized for detection and/or diagnosis of SARS-CoV-2 by FDA under an Emergency Use Authorization (EUA). This EUA will remain in effect (meaning this test can be used) for the duration of the COVID-19 declaration under Section 564(b)(1) of the Act, 21 U.S.C. section 360bbb-3(b)(1), unless the authorization is terminated or revoked.     Resp Syncytial Virus by PCR NEGATIVE NEGATIVE Final    Comment: (NOTE) Fact Sheet for  Patients: bloggercourse.com  Fact Sheet for Healthcare Providers: seriousbroker.it  This test is not yet approved or cleared by the [place]  FDA and has been authorized for detection and/or diagnosis of SARS-CoV-2 by FDA under an Emergency Use Authorization (EUA). This EUA will remain in effect (meaning this test can be used) for the duration of the COVID-19 declaration under Section 564(b)(1) of the Act, 21 U.S.C. section 360bbb-3(b)(1), unless the authorization is terminated or revoked.  Performed at [facility], [address] [place], [name] [address]   Respiratory (~20 pathogens) panel by PCR     Status: None   Collection Time: 01/09/25  8:46 PM   Specimen: Nasopharyngeal Swab; Respiratory  Result Value Ref Range Status   Adenovirus NOT DETECTED NOT DETECTED Final   Coronavirus 229E NOT DETECTED NOT DETECTED Final    Comment: (NOTE) The Coronavirus on the Respiratory Panel, DOES NOT test for the novel  Coronavirus (2019 nCoV)    Coronavirus HKU1 NOT DETECTED NOT DETECTED Final   Coronavirus NL63 NOT DETECTED NOT DETECTED Final   Coronavirus OC43 NOT DETECTED NOT DETECTED Final   Metapneumovirus NOT DETECTED NOT DETECTED Final   Rhinovirus / Enterovirus NOT DETECTED NOT DETECTED Final   Influenza A NOT DETECTED NOT DETECTED Final   Influenza B NOT DETECTED NOT DETECTED Final   Parainfluenza Virus 1 NOT DETECTED NOT DETECTED Final   Parainfluenza Virus 2 NOT DETECTED NOT DETECTED Final   Parainfluenza Virus 3 NOT DETECTED NOT DETECTED Final   Parainfluenza Virus 4 NOT DETECTED NOT DETECTED Final   Respiratory Syncytial Virus NOT DETECTED NOT DETECTED Final   Bordetella pertussis NOT DETECTED NOT DETECTED Final   Bordetella Parapertussis NOT DETECTED NOT DETECTED Final   Chlamydophila pneumoniae NOT DETECTED NOT DETECTED Final   Mycoplasma pneumoniae NOT DETECTED NOT DETECTED Final    Comment: Performed at  [facility], [address] [place], [name] [address]  Culture, blood (Routine X 2) w Reflex to ID Panel     Status: None   Collection Time:  01/09/25 10:40 PM   Specimen: BLOOD  Result Value Ref Range Status   Specimen Description BLOOD SITE NOT SPECIFIED  Final   Special Requests   Final    BOTTLES DRAWN AEROBIC ONLY Blood Culture results may not be optimal due to an inadequate volume of blood received in culture bottles   Culture   Final    NO GROWTH 5 DAYS Performed at [facility], [address], [place], [name] [address]    Report Status 01/14/2025 FINAL  Final     Medications:    aspirin  EC  81 mg Oral q AM   atorvastatin   80 mg Oral Daily   buPROPion   300 mg Oral q morning   Chlorhexidine  Gluconate Cloth  6 each Topical Daily   clopidogrel   75 mg Oral Daily   feeding supplement  237 mL Oral BID BM   heparin   5,000 Units Subcutaneous Q8H   insulin  aspart  0-15 Units Subcutaneous TID WC   insulin  aspart  0-5 Units Subcutaneous QHS   insulin  aspart  6 Units Subcutaneous TID WC   insulin  glargine  50 Units Subcutaneous BID   lidocaine   2 patch Transdermal Q24H   pregabalin   150 mg Oral BID   sodium chloride  flush  3 mL Intravenous Q12H   Continuous Infusions:    LOS: 10 days   Erle Odell Castor  [facility]  01/19/2025, 8:50 AM

## 2025-01-20 DIAGNOSIS — G9341 Metabolic encephalopathy: Secondary | ICD-10-CM | POA: Diagnosis not present

## 2025-01-20 LAB — CBC WITH DIFFERENTIAL/PLATELET
Abs Immature Granulocytes: 0.15 10*3/uL — ABNORMAL HIGH (ref 0.00–0.07)
Basophils Absolute: 0 10*3/uL (ref 0.0–0.1)
Basophils Relative: 0 %
Eosinophils Absolute: 0.3 10*3/uL (ref 0.0–0.5)
Eosinophils Relative: 2 %
HCT: 29.3 % — ABNORMAL LOW (ref 39.0–52.0)
Hemoglobin: 9 g/dL — ABNORMAL LOW (ref 13.0–17.0)
Immature Granulocytes: 1 %
Lymphocytes Relative: 11 %
Lymphs Abs: 1.3 10*3/uL (ref 0.7–4.0)
MCH: 26.1 pg (ref 26.0–34.0)
MCHC: 30.7 g/dL (ref 30.0–36.0)
MCV: 84.9 fL (ref 80.0–100.0)
Monocytes Absolute: 1.2 10*3/uL — ABNORMAL HIGH (ref 0.1–1.0)
Monocytes Relative: 10 %
Neutro Abs: 8.5 10*3/uL — ABNORMAL HIGH (ref 1.7–7.7)
Neutrophils Relative %: 76 %
Platelets: 565 10*3/uL — ABNORMAL HIGH (ref 150–400)
RBC: 3.45 MIL/uL — ABNORMAL LOW (ref 4.22–5.81)
RDW: 15.2 % (ref 11.5–15.5)
WBC: 11.4 10*3/uL — ABNORMAL HIGH (ref 4.0–10.5)
nRBC: 0 % (ref 0.0–0.2)

## 2025-01-20 LAB — BASIC METABOLIC PANEL WITH GFR
Anion gap: 13 (ref 5–15)
BUN: 103 mg/dL — ABNORMAL HIGH (ref 6–20)
CO2: 20 mmol/L — ABNORMAL LOW (ref 22–32)
Calcium: 9.6 mg/dL (ref 8.9–10.3)
Chloride: 99 mmol/L (ref 98–111)
Creatinine, Ser: 2.74 mg/dL — ABNORMAL HIGH (ref 0.61–1.24)
GFR, Estimated: 27 mL/min — ABNORMAL LOW
Glucose, Bld: 155 mg/dL — ABNORMAL HIGH (ref 70–99)
Potassium: 5.2 mmol/L — ABNORMAL HIGH (ref 3.5–5.1)
Sodium: 132 mmol/L — ABNORMAL LOW (ref 135–145)

## 2025-01-20 LAB — GLUCOSE, CAPILLARY
Glucose-Capillary: 174 mg/dL — ABNORMAL HIGH (ref 70–99)
Glucose-Capillary: 160 mg/dL — ABNORMAL HIGH (ref 70–99)
Glucose-Capillary: 156 mg/dL — ABNORMAL HIGH (ref 70–99)
Glucose-Capillary: 191 mg/dL — ABNORMAL HIGH (ref 70–99)

## 2025-01-20 MED ORDER — OXYCODONE HCL 5 MG PO TABS
5.0000 mg | ORAL_TABLET | Freq: Once | ORAL | Status: AC
  Administered 2025-01-20: 5 mg via ORAL
  Filled 2025-01-20: qty 1

## 2025-01-20 NOTE — Progress Notes (Signed)
 Upon assessment this AM patient is sleepy and lethargic will only wake up to touch or voice, provider was on floor and rounding this am and aware,  and patient was allowed to rest. During morning rounds patient still asleep and hard to arouse was attempting to give oral medications with success he was able to wake up and take those medication with out difficultly. On afternoon rounds patient still with difficulty in waking up, provider on floor and in room orders received. Call bell with in reach. Joseph [name] Joseph Cassetta RN

## 2025-01-20 NOTE — TOC Progression Note (Signed)
 Transition of Care [facility]) - Progression Note    Patient Details  Name: Joseph [name] MRN: [id number] Date of Birth: 08/19/1971  [facility]) CM/SW Contact  [name], [name] Phone Number: 01/20/2025, 11:35 AM  Clinical Narrative:     Insurance still pending for [facility]F  Expected Discharge Plan: Skilled Nursing Facility Barriers to Discharge: Insurance Authorization               Expected Discharge Plan and Services       Living arrangements for the past 2 months: Single Family Home                                       Social Drivers of Health (SDOH) Interventions SDOH Screenings   Food Insecurity: No Food Insecurity (01/11/2025)  Housing: Low Risk (01/11/2025)  Transportation Needs: Unmet Transportation Needs (01/11/2025)  Utilities: Not At Risk (01/11/2025)  Financial Resource Strain: Medium Risk (09/09/2022)  Social Connections: Moderately Isolated (03/28/2024)  Stress: No Stress Concern Present (10/10/2023)   Received from [facility]  Tobacco Use: Medium Risk (01/12/2025)    Readmission Risk Interventions    05/03/2023   11:58 AM  Readmission Risk Prevention Plan  Transportation Screening Complete  PCP or Specialist Appt within 3-5 Days Complete  HRI or Home Care Consult Complete  Social Work Consult for Recovery Care Planning/Counseling Complete  Palliative Care Screening Not Applicable  Medication Review Oceanographer) Referral to Pharmacy

## 2025-01-20 NOTE — Plan of Care (Signed)
" °  Problem: Education: Goal: Knowledge of General Education information will improve Description: Including pain rating scale, medication(s)/side effects and non-pharmacologic comfort measures Outcome: Not Progressing   Problem: Health Behavior/Discharge Planning: Goal: Ability to manage health-related needs will improve Outcome: Not Progressing   Problem: Clinical Measurements: Goal: Ability to maintain clinical measurements within normal limits will improve Outcome: Not Progressing Goal: Will remain free from infection Outcome: Not Progressing Goal: Diagnostic test results will improve Outcome: Not Progressing Goal: Respiratory complications will improve Outcome: Not Progressing Goal: Cardiovascular complication will be avoided Outcome: Not Progressing   Problem: Activity: Goal: Risk for activity intolerance will decrease Outcome: Not Progressing   Problem: Nutrition: Goal: Adequate nutrition will be maintained Outcome: Not Progressing   Problem: Coping: Goal: Level of anxiety will decrease Outcome: Not Progressing   Problem: Elimination: Goal: Will not experience complications related to bowel motility Outcome: Not Progressing Goal: Will not experience complications related to urinary retention Outcome: Not Progressing   Problem: Education: Goal: Understanding of CV disease, CV risk reduction, and recovery process will improve Outcome: Not Progressing Goal: Individualized Educational Video(s) Outcome: Not Progressing   Problem: Activity: Goal: Ability to return to baseline activity level will improve Outcome: Not Progressing   "

## 2025-01-20 NOTE — Care Management Important Message (Signed)
 Important Message  Patient Details  Name: Joseph [name] MRN: [id number] Date of Birth: [birth year]   Important Message Given:  Yes - Medicare IM  Printed to unit and staff to provide to patient   Charlann Rayfield Hurst, RN 01/20/2025, 11:48 AM

## 2025-01-21 ENCOUNTER — Other Ambulatory Visit (HOSPITAL_COMMUNITY): Payer: Self-pay

## 2025-01-21 DIAGNOSIS — R338 Other retention of urine: Secondary | ICD-10-CM | POA: Diagnosis not present

## 2025-01-21 LAB — GLUCOSE, CAPILLARY
Glucose-Capillary: 295 mg/dL — ABNORMAL HIGH (ref 70–99)
Glucose-Capillary: 272 mg/dL — ABNORMAL HIGH (ref 70–99)
Glucose-Capillary: 304 mg/dL — ABNORMAL HIGH (ref 70–99)
Glucose-Capillary: 238 mg/dL — ABNORMAL HIGH (ref 70–99)
Glucose-Capillary: 150 mg/dL — ABNORMAL HIGH (ref 70–99)

## 2025-01-21 MED ORDER — OXYCODONE HCL 5 MG PO TABS
5.0000 mg | ORAL_TABLET | Freq: Once | ORAL | Status: AC
  Administered 2025-01-21: 5 mg via ORAL
  Filled 2025-01-21: qty 1

## 2025-01-21 MED ORDER — OXYCODONE HCL 5 MG PO TABS
5.0000 mg | ORAL_TABLET | ORAL | Status: DC | PRN
  Administered 2025-01-21 – 2025-01-22 (×3): 5 mg via ORAL
  Filled 2025-01-21 (×3): qty 1

## 2025-01-21 MED ORDER — OXYCODONE HCL 5 MG PO TABS
5.0000 mg | ORAL_TABLET | Freq: Once | ORAL | Status: AC
  Filled 2025-01-21 (×2): qty 1

## 2025-01-21 MED ORDER — SODIUM ZIRCONIUM CYCLOSILICATE 10 G PO PACK
10.0000 g | PACK | Freq: Two times a day (BID) | ORAL | Status: AC
  Administered 2025-01-21 (×2): 10 g via ORAL
  Filled 2025-01-21 (×2): qty 1

## 2025-01-21 MED ORDER — PREGABALIN 100 MG PO CAPS
100.0000 mg | ORAL_CAPSULE | Freq: Two times a day (BID) | ORAL | Status: DC
  Administered 2025-01-21 – 2025-01-23 (×6): 100 mg via ORAL
  Filled 2025-01-21 (×7): qty 1

## 2025-01-21 MED ORDER — ALPRAZOLAM 0.5 MG PO TABS
0.5000 mg | ORAL_TABLET | Freq: Two times a day (BID) | ORAL | Status: DC
  Administered 2025-01-21 – 2025-01-23 (×3): 0.5 mg via ORAL
  Filled 2025-01-21 (×5): qty 1

## 2025-01-21 NOTE — TOC Progression Note (Signed)
 Transition of Care [facility]) - Progression Note    Patient Details  Name: Joseph [name] MRN: [id number] Date of Birth: [birth year]  Transition of Care [facility]) CM/SW Contact  [name], [name] Phone Number: 01/21/2025, 3:37 PM  Clinical Narrative:     Insurance still pending for SNF - sent updated PT note as requested   Montie Louder, MSW, LCSW Clinical Social Worker     Expected Discharge Plan: Skilled Nursing Facility Barriers to Discharge: Insurance Authorization               Expected Discharge Plan and Services       Living arrangements for the past 2 months: Single Family Home                                       Social Drivers of Health (SDOH) Interventions SDOH Screenings   Food Insecurity: No Food Insecurity (01/11/2025)  Housing: Low Risk (01/11/2025)  Transportation Needs: Unmet Transportation Needs (01/11/2025)  Utilities: Not At Risk (01/11/2025)  Financial Resource Strain: Medium Risk (09/09/2022)  Social Connections: Moderately Isolated (03/28/2024)  Stress: No Stress Concern Present (10/10/2023)   Received from [facility]  Tobacco Use: Medium Risk (01/12/2025)    Readmission Risk Interventions    05/03/2023   11:58 AM  Readmission Risk Prevention Plan  Transportation Screening Complete  PCP or Specialist Appt within 3-5 Days Complete  HRI or Home Care Consult Complete  Social Work Consult for Recovery Care Planning/Counseling Complete  Palliative Care Screening Not Applicable  Medication Review Oceanographer) Referral to Pharmacy

## 2025-01-21 NOTE — Progress Notes (Signed)
" °   01/21/25 1850  Vitals  Temp 98.9 F (37.2 C)  Temp Source Oral  BP (!) 119/96  MAP (mmHg) 104  BP Location Right Arm  BP Method Automatic  Patient Position (if appropriate) Sitting  Pulse Rate (!) 106  Pulse Rate Source Monitor  ECG Heart Rate (!) 106  Resp 20  Level of Consciousness  Level of Consciousness Alert  MEWS COLOR  MEWS Score Color Green  Oxygen Therapy  SpO2 94 %  O2 Device Room Air  Patient Activity (if Appropriate) In bed  Pulse Oximetry Type Continuous  PCA/Epidural/Spinal Assessment  Respiratory Pattern Regular;Unlabored;Symmetrical  Glasgow Coma Scale  Eye Opening 4  Best Verbal Response (NON-intubated) 5  Best Motor Response 6  Glasgow Coma Scale Score 15  MEWS Score  MEWS Temp 0  MEWS Systolic 0  MEWS Pulse 1  MEWS RR 0  MEWS LOC 0  MEWS Score 1   Pt transferred to unit from 4E. Pt A&Ox4, MAEx4, alert and verbally responsive. Tele applied and CCMD called. Pt with no PIV. Pt refusing to turn for full skin assessment of back and bottom. Existing wound noted to Right BKA. Pt oriented to unit, room, and call light system. Report given to oncoming shift.  "

## 2025-01-22 DIAGNOSIS — R338 Other retention of urine: Secondary | ICD-10-CM | POA: Diagnosis not present

## 2025-01-22 LAB — BASIC METABOLIC PANEL WITH GFR
Anion gap: 11 (ref 5–15)
BUN: 89 mg/dL — ABNORMAL HIGH (ref 6–20)
CO2: 20 mmol/L — ABNORMAL LOW (ref 22–32)
Calcium: 9.3 mg/dL (ref 8.9–10.3)
Chloride: 101 mmol/L (ref 98–111)
Creatinine, Ser: 2.41 mg/dL — ABNORMAL HIGH (ref 0.61–1.24)
GFR, Estimated: 32 mL/min — ABNORMAL LOW
Glucose, Bld: 152 mg/dL — ABNORMAL HIGH (ref 70–99)
Potassium: 5.6 mmol/L — ABNORMAL HIGH (ref 3.5–5.1)
Sodium: 132 mmol/L — ABNORMAL LOW (ref 135–145)

## 2025-01-22 LAB — GLUCOSE, CAPILLARY
Glucose-Capillary: 148 mg/dL — ABNORMAL HIGH (ref 70–99)
Glucose-Capillary: 206 mg/dL — ABNORMAL HIGH (ref 70–99)
Glucose-Capillary: 176 mg/dL — ABNORMAL HIGH (ref 70–99)
Glucose-Capillary: 269 mg/dL — ABNORMAL HIGH (ref 70–99)
Glucose-Capillary: 318 mg/dL — ABNORMAL HIGH (ref 70–99)

## 2025-01-22 MED ORDER — OXYCODONE HCL 5 MG PO TABS
15.0000 mg | ORAL_TABLET | ORAL | Status: DC | PRN
  Administered 2025-01-22 – 2025-01-24 (×8): 15 mg via ORAL
  Filled 2025-01-22 (×8): qty 3

## 2025-01-22 MED ORDER — SODIUM ZIRCONIUM CYCLOSILICATE 10 G PO PACK
10.0000 g | PACK | Freq: Every day | ORAL | Status: AC
  Administered 2025-01-22 – 2025-01-23 (×2): 10 g via ORAL
  Filled 2025-01-22 (×2): qty 1

## 2025-01-23 LAB — GLUCOSE, CAPILLARY
Glucose-Capillary: 206 mg/dL — ABNORMAL HIGH (ref 70–99)
Glucose-Capillary: 136 mg/dL — ABNORMAL HIGH (ref 70–99)
Glucose-Capillary: 217 mg/dL — ABNORMAL HIGH (ref 70–99)
Glucose-Capillary: 208 mg/dL — ABNORMAL HIGH (ref 70–99)

## 2025-01-23 LAB — BASIC METABOLIC PANEL WITH GFR
Anion gap: 9 (ref 5–15)
BUN: 90 mg/dL — ABNORMAL HIGH (ref 6–20)
CO2: 20 mmol/L — ABNORMAL LOW (ref 22–32)
Calcium: 9 mg/dL (ref 8.9–10.3)
Chloride: 101 mmol/L (ref 98–111)
Creatinine, Ser: 2.32 mg/dL — ABNORMAL HIGH (ref 0.61–1.24)
GFR, Estimated: 33 mL/min — ABNORMAL LOW
Glucose, Bld: 229 mg/dL — ABNORMAL HIGH (ref 70–99)
Potassium: 6.1 mmol/L — ABNORMAL HIGH (ref 3.5–5.1)
Sodium: 131 mmol/L — ABNORMAL LOW (ref 135–145)

## 2025-01-23 MED ORDER — SODIUM ZIRCONIUM CYCLOSILICATE 5 G PO PACK
10.0000 g | PACK | Freq: Two times a day (BID) | ORAL | Status: AC
  Administered 2025-01-23 (×2): 10 g via ORAL
  Filled 2025-01-23: qty 2
  Filled 2025-01-23: qty 1
  Filled 2025-01-23: qty 2

## 2025-01-23 NOTE — TOC Progression Note (Signed)
 Transition of Care [facility]) - Progression Note    Patient Details  Name: Joseph [name] MRN: [id number] Date of Birth: [birth year]  Transition [facility] Care [facility]) CM/SW Contact  [name], [name] Phone Number: 01/23/2025, 11:13 AM  Clinical Narrative:     Updated SNF- patient will not d/c today due to abnormal labs per MD- possible d/c tomorrow   TOC continuing to follow   Montie Louder, MSW, LCSW Clinical Social Worker    Expected Discharge Plan: Skilled Nursing Facility Barriers to Discharge: Insurance Authorization               Expected Discharge Plan and Services       Living arrangements for the past 2 months: Single Family Home                                       Social Drivers of Health (SDOH) Interventions SDOH Screenings   Food Insecurity: No Food Insecurity (01/11/2025)  Housing: Low Risk (01/11/2025)  Transportation Needs: Unmet Transportation Needs (01/11/2025)  Utilities: Not At Risk (01/11/2025)  Financial Resource Strain: Medium Risk (09/09/2022)  Social Connections: Moderately Isolated (03/28/2024)  Stress: No Stress Concern Present (10/10/2023)   Received from [facility]  Tobacco Use: Medium Risk (01/12/2025)    Readmission Risk Interventions    05/03/2023   11:58 AM  Readmission Risk Prevention Plan  Transportation Screening Complete  PCP or Specialist Appt within 3-5 Days Complete  HRI or Home Care Consult Complete  Social Work Consult for Recovery Care Planning/Counseling Complete  Palliative Care Screening Not Applicable  Medication Review Oceanographer) Referral to Pharmacy

## 2025-01-23 NOTE — Progress Notes (Signed)
 " PROGRESS NOTE  Joseph [name] FMW:[id number] DOB: [birth year] DOA: 01/09/2025 PCP: Alec House, MD   LOS: 14 days   Brief narrative:  Joseph [name] is an 54 y.o. male past medical history of asthma COPD diabetes mellitus type 2, essential hypertension, PE/DVT on Xarelto , history of PAD status post bilateral BKA, history of CAD with a history of CABG presented hospital initially in the ICU on BiPAP since he was lethargic after cardiac catheterization which was clean.  He also had elevated blood glucose level.  He was admitted to the ICU due to acute metabolic encephalopathy AKI on CKD with hyperglycemia and DKA.     Assessment/Plan: Principal Problem:   Acute metabolic encephalopathy Active Problems:   Acute urinary retention   BMI 70 and over, adult (HCC)   Acute kidney injury superimposed on stage 3b chronic kidney disease (HCC)   Essential hypertension   Insulin  dependent type 2 diabetes mellitus (HCC)   Hyponatremia   Chest pain  Acute metabolic encephalopathy: Likely due to polypharmacy, CO2 retention, possible DKA.  Medications have been started on a lower dose.     Chronic pain Some narcotic seeking behavior. CT cervical spine unrevealing.  Continue Xanax , Lyrica , oxycodone    Hyperkalemia: Received Lokelma .  Check BMP from today with potassium of 6.0.  Will again give Lokelma  10 mg twice daily for the next few doses.  Rising potassium level today.   Acute kidney injury on chronic kidney disease stage IIIb/anion gap metabolic acidosis: Multifactorial in the setting of urinary retention, hemodynamically mediated and recent contrast exposure.  Nephrology was consulted and latest creatinine of 2.4 on 01/22/2025.  Will repeat BMP  Insulin -dependent diabetes mellitus type 2 with hyperglycemia: Review of last hemoglobin t A1c of 7.9.  Continue sliding scale insulin  and long-acting insulin .   Sepsis secondary to right leg stump infection. Aspiration pneumonia blood culture was  likely contaminant.  Completed antibiotics for aspiration pneumonia.   COPD: Continue bronchodilators.  Currently on room air. History of CABG: There was complaint of an anginal pain and underwent cardiac cath which was unrevealing.  Continue aspirin  and Plavix    History of DVT/PE: Not on anticoagulation.  History of remote PE 2014   Depression/anxiety: Continue Wellbutrin  and Lexapro .   Class III obesity. Body mass index is 66.98 kg/m.  Would benefit from lifestyle modification   Stage 1 coccyx and stage 2 bilateral posterior thigh pressure injuries present on admission.  Continue wound care Wound 01/20/25 2030 Pressure Injury Coccyx Posterior;Medial Stage 1 -  Intact skin with non-blanchable redness of a localized area usually over a bony prominence. (Active)     Wound 01/20/25 2030 Pressure Injury Thigh Posterior;Proximal;Right Stage 2 -  Partial thickness loss of dermis presenting as a shallow open injury with a red, pink wound bed without slough. (Active)     Wound 01/20/25 2030 Pressure Injury Thigh Left;Posterior;Proximal Stage 2 -  Partial thickness loss of dermis presenting as a shallow open injury with a red, pink wound bed without slough. (Active)   Deconditioning debility, history of bilateral below-knee amputation patient has been seen by physical therapy and rec commend skilled nursing facility placement.  DVT prophylaxis: heparin  injection 5,000 Units Start: 01/09/25 2200   Disposition: Skilled nursing facility likely 01/24/2025 if potassium is okay  Status is: Inpatient Remains inpatient appropriate because: Hyperkalemia, need for rehabitation.    Code Status:     Code Status: Full Code  Family Communication: None at bedside  Consultants: None  Procedures: None  Anti-infectives:  None currently  Anti-infectives (From admission, onward)    Start     Dose/Rate Route Frequency Ordered Stop   01/14/25 1000  amoxicillin -clavulanate (AUGMENTIN ) 875-125 MG  per tablet 1 tablet        1 tablet Oral Every 12 hours 01/14/25 0830 01/16/25 2103   01/13/25 1345  Ampicillin -Sulbactam (UNASYN ) 3 g in sodium chloride  0.9 % 100 mL IVPB  Status:  Discontinued        3 g 200 mL/hr over 30 Minutes Intravenous Every 6 hours 01/13/25 1248 01/14/25 0830   01/13/25 0800  linezolid  (ZYVOX ) tablet 600 mg        600 mg Oral Every 12 hours 01/12/25 1953 01/16/25 2103   01/12/25 1500  linezolid  (ZYVOX ) tablet 600 mg  Status:  Discontinued        600 mg Oral Every 12 hours 01/12/25 0851 01/12/25 1953   01/12/25 1400  Ampicillin -Sulbactam (UNASYN ) 3 g in sodium chloride  0.9 % 100 mL IVPB  Status:  Discontinued        3 g 200 mL/hr over 30 Minutes Intravenous Every 8 hours 01/12/25 0836 01/13/25 1248   01/11/25 1415  vancomycin  (VANCOREADY) IVPB 1500 mg/300 mL        1,500 mg 150 mL/hr over 120 Minutes Intravenous  Once 01/11/25 1327 01/11/25 1702   01/09/25 2300  vancomycin  (VANCOCIN ) 2,500 mg in sodium chloride  0.9 % 500 mL IVPB        2,500 mg 262.5 mL/hr over 120 Minutes Intravenous  Once 01/09/25 2207 01/10/25 0402   01/09/25 2300  piperacillin -tazobactam (ZOSYN ) IVPB 3.375 g  Status:  Discontinued        3.375 g 12.5 mL/hr over 240 Minutes Intravenous Every 8 hours 01/09/25 2207 01/12/25 0836   01/09/25 2207  vancomycin  variable dose per unstable renal function (pharmacist dosing)  Status:  Discontinued         Does not apply See admin instructions 01/09/25 2207 01/12/25 0851        Subjective: Today, patient was seen and examined at bedside.  Patient complains of headache and some nausea.  Has had bowel movement yesterday.  Denies any fever chills shortness of breath or dyspnea.  Objective: Vitals:   01/23/25 0312 01/23/25 0753  BP: 107/87 103/83  Pulse:  98  Resp: (!) 21 18  Temp: 99.1 F (37.3 C) 99.4 F (37.4 C)  SpO2:  94%    Intake/Output Summary (Last 24 hours) at 01/23/2025 1111 Last data filed at 01/23/2025 0313 Gross per 24 hour  Intake  --  Output 2100 ml  Net -2100 ml   Filed Weights   01/19/25 0500 01/20/25 0307 01/22/25 0500  Weight: (!) 164 kg (!) 164.2 kg (!) 160.8 kg   Body mass index is 66.98 kg/m.   Physical Exam: GENERAL: Patient is alert awake and oriented. Not in obvious distress.  Obese HENT: No scleral pallor or icterus. Pupils equally reactive to light. Oral mucosa is moist NECK: is supple, no gross swelling noted. CHEST: Clear to auscultation. No crackles or wheezes.  CVS: S1 and S2 heard, no murmur. Regular rate and rhythm.  ABDOMEN: Soft, non-tender, bowel sounds are present. EXTREMITIES: Bilateral below-knee amputation. CNS: Cranial nerves are intact. No focal motor deficits. SKIN: warm and dry  Data Review: I have personally reviewed the following laboratory data and studies,  CBC: Recent Labs  Lab 01/20/25 1932  WBC 11.4*  NEUTROABS 8.5*  HGB 9.0*  HCT 29.3*  MCV 84.9  PLT 565*   Basic Metabolic Panel: Recent Labs  Lab 01/20/25 1932 01/22/25 0951 01/23/25 0749  NA 132* 132* 131*  K 5.2* 5.6* 6.1*  CL 99 101 101  CO2 20* 20* 20*  GLUCOSE 155* 152* 229*  BUN 103* 89* 90*  CREATININE 2.74* 2.41* 2.32*  CALCIUM  9.6 9.3 9.0   Liver Function Tests: No results for input(s): AST, ALT, ALKPHOS, BILITOT, PROT, ALBUMIN  in the last 168 hours. No results for input(s): LIPASE, AMYLASE in the last 168 hours. No results for input(s): AMMONIA in the last 168 hours. Cardiac Enzymes: No results for input(s): CKTOTAL, CKMB, CKMBINDEX, TROPONINI in the last 168 hours. BNP (last 3 results) Recent Labs    03/27/24 2210  BNP 90.4    ProBNP (last 3 results) Recent Labs    04/27/24 1051 12/17/24 1056 01/10/25 1007  PROBNP 895* 285* 576.0*    CBG: Recent Labs  Lab 01/22/25 1147 01/22/25 1343 01/22/25 1637 01/22/25 2051 01/23/25 0750  GLUCAP 206* 176* 269* 318* 206*   No results found for this or any previous visit (from the past 240 hours).    Studies: No results found.    Si Jachim, MD  [facility] 01/23/2025  If 7PM-7AM, please contact night-coverage             "

## 2025-01-23 NOTE — Plan of Care (Signed)
 " Problem: Education: Goal: Knowledge of General Education information will improve Description: Including pain rating scale, medication(s)/side effects and non-pharmacologic comfort measures 01/23/2025 1915 by Okey Lauree RAMAN, RN Outcome: Progressing 01/23/2025 1905 by Okey Lauree RAMAN, RN Outcome: Progressing   Problem: Health Behavior/Discharge Planning: Goal: Ability to manage health-related needs will improve 01/23/2025 1915 by Okey Lauree RAMAN, RN Outcome: Progressing 01/23/2025 1905 by Okey Lauree RAMAN, RN Outcome: Progressing   Problem: Clinical Measurements: Goal: Ability to maintain clinical measurements within normal limits will improve 01/23/2025 1915 by Okey Lauree RAMAN, RN Outcome: Progressing 01/23/2025 1905 by Okey Lauree RAMAN, RN Outcome: Progressing Goal: Will remain free from infection 01/23/2025 1915 by Okey Lauree RAMAN, RN Outcome: Progressing 01/23/2025 1905 by Okey Lauree RAMAN, RN Outcome: Progressing Goal: Diagnostic test results will improve 01/23/2025 1915 by Okey Lauree RAMAN, RN Outcome: Progressing 01/23/2025 1905 by Okey Lauree RAMAN, RN Outcome: Progressing Goal: Respiratory complications will improve 01/23/2025 1915 by Okey Lauree RAMAN, RN Outcome: Progressing 01/23/2025 1905 by Okey Lauree RAMAN, RN Outcome: Progressing Goal: Cardiovascular complication will be avoided 01/23/2025 1915 by Okey Lauree RAMAN, RN Outcome: Progressing 01/23/2025 1905 by Okey Lauree RAMAN, RN Outcome: Progressing   Problem: Activity: Goal: Risk for activity intolerance will decrease 01/23/2025 1915 by Okey Lauree RAMAN, RN Outcome: Progressing 01/23/2025 1905 by Okey Lauree RAMAN, RN Outcome: Progressing   Problem: Nutrition: Goal: Adequate nutrition will be maintained 01/23/2025 1915 by Okey Lauree RAMAN, RN Outcome: Progressing 01/23/2025 1905 by Okey Lauree RAMAN, RN Outcome: Progressing   Problem: Coping: Goal: Level of anxiety will decrease 01/23/2025 1915 by Okey Lauree RAMAN, RN Outcome: Progressing 01/23/2025 1905 by Okey Lauree RAMAN,  RN Outcome: Progressing   Problem: Elimination: Goal: Will not experience complications related to bowel motility 01/23/2025 1915 by Okey Lauree RAMAN, RN Outcome: Progressing 01/23/2025 1905 by Okey Lauree RAMAN, RN Outcome: Progressing Goal: Will not experience complications related to urinary retention 01/23/2025 1915 by Okey Lauree RAMAN, RN Outcome: Progressing 01/23/2025 1905 by Okey Lauree RAMAN, RN Outcome: Progressing   Problem: Pain Managment: Goal: General experience of comfort will improve and/or be controlled 01/23/2025 1915 by Okey Lauree RAMAN, RN Outcome: Progressing 01/23/2025 1905 by Okey Lauree RAMAN, RN Outcome: Progressing   Problem: Safety: Goal: Ability to remain free from injury will improve 01/23/2025 1915 by Okey Lauree RAMAN, RN Outcome: Progressing 01/23/2025 1905 by Okey Lauree RAMAN, RN Outcome: Progressing   Problem: Skin Integrity: Goal: Risk for impaired skin integrity will decrease 01/23/2025 1915 by Okey Lauree RAMAN, RN Outcome: Progressing 01/23/2025 1905 by Okey Lauree RAMAN, RN Outcome: Progressing   Problem: Education: Goal: Understanding of CV disease, CV risk reduction, and recovery process will improve 01/23/2025 1915 by Okey Lauree RAMAN, RN Outcome: Progressing 01/23/2025 1905 by Okey Lauree RAMAN, RN Outcome: Progressing Goal: Individualized Educational Video(s) 01/23/2025 1915 by Okey Lauree RAMAN, RN Outcome: Progressing 01/23/2025 1905 by Okey Lauree RAMAN, RN Outcome: Progressing   Problem: Activity: Goal: Ability to return to baseline activity level will improve 01/23/2025 1915 by Okey Lauree RAMAN, RN Outcome: Progressing 01/23/2025 1905 by Okey Lauree RAMAN, RN Outcome: Progressing   Problem: Cardiovascular: Goal: Ability to achieve and maintain adequate cardiovascular perfusion will improve 01/23/2025 1915 by Okey Lauree RAMAN, RN Outcome: Progressing 01/23/2025 1905 by Okey Lauree RAMAN, RN Outcome: Progressing Goal: Vascular access site(s) Level 0-1 will be maintained 01/23/2025 1915 by Okey Lauree RAMAN,  RN Outcome: Progressing 01/23/2025 1905 by Okey Lauree RAMAN, RN Outcome: Progressing   Problem: Health Behavior/Discharge Planning: Goal: Ability to safely  manage health-related needs after discharge will improve 01/23/2025 1915 by Okey Lauree RAMAN, RN Outcome: Progressing 01/23/2025 1905 by Okey Lauree RAMAN, RN Outcome: Progressing   Problem: Education: Goal: Ability to describe self-care measures that may prevent or decrease complications (Diabetes Survival Skills Education) will improve 01/23/2025 1915 by Okey Lauree RAMAN, RN Outcome: Progressing 01/23/2025 1905 by Okey Lauree RAMAN, RN Outcome: Progressing Goal: Individualized Educational Video(s) 01/23/2025 1915 by Okey Lauree RAMAN, RN Outcome: Progressing 01/23/2025 1905 by Okey Lauree RAMAN, RN Outcome: Progressing   Problem: Coping: Goal: Ability to adjust to condition or change in health will improve 01/23/2025 1915 by Okey Lauree RAMAN, RN Outcome: Progressing 01/23/2025 1905 by Okey Lauree RAMAN, RN Outcome: Progressing   Problem: Fluid Volume: Goal: Ability to maintain a balanced intake and output will improve 01/23/2025 1915 by Okey Lauree RAMAN, RN Outcome: Progressing 01/23/2025 1905 by Okey Lauree RAMAN, RN Outcome: Progressing   Problem: Health Behavior/Discharge Planning: Goal: Ability to identify and utilize available resources and services will improve 01/23/2025 1915 by Okey Lauree RAMAN, RN Outcome: Progressing 01/23/2025 1905 by Okey Lauree RAMAN, RN Outcome: Progressing Goal: Ability to manage health-related needs will improve 01/23/2025 1915 by Okey Lauree RAMAN, RN Outcome: Progressing 01/23/2025 1905 by Okey Lauree RAMAN, RN Outcome: Progressing   Problem: Metabolic: Goal: Ability to maintain appropriate glucose levels will improve 01/23/2025 1915 by Okey Lauree RAMAN, RN Outcome: Progressing 01/23/2025 1905 by Okey Lauree RAMAN, RN Outcome: Progressing   Problem: Nutritional: Goal: Maintenance of adequate nutrition will improve 01/23/2025 1915 by Okey Lauree RAMAN, RN Outcome:  Progressing 01/23/2025 1905 by Okey Lauree RAMAN, RN Outcome: Progressing Goal: Progress toward achieving an optimal weight will improve 01/23/2025 1915 by Okey Lauree RAMAN, RN Outcome: Progressing 01/23/2025 1905 by Okey Lauree RAMAN, RN Outcome: Progressing   Problem: Skin Integrity: Goal: Risk for impaired skin integrity will decrease 01/23/2025 1915 by Okey Lauree RAMAN, RN Outcome: Progressing 01/23/2025 1905 by Okey Lauree RAMAN, RN Outcome: Progressing   Problem: Tissue Perfusion: Goal: Adequacy of tissue perfusion will improve 01/23/2025 1915 by Okey Lauree RAMAN, RN Outcome: Progressing 01/23/2025 1905 by Okey Lauree RAMAN, RN Outcome: Progressing   Problem: Education: Goal: Ability to describe self-care measures that may prevent or decrease complications (Diabetes Survival Skills Education) will improve 01/23/2025 1915 by Okey Lauree RAMAN, RN Outcome: Progressing 01/23/2025 1905 by Okey Lauree RAMAN, RN Outcome: Progressing Goal: Individualized Educational Video(s) 01/23/2025 1915 by Okey Lauree RAMAN, RN Outcome: Progressing 01/23/2025 1905 by Okey Lauree RAMAN, RN Outcome: Progressing   Problem: Cardiac: Goal: Ability to maintain an adequate cardiac output will improve 01/23/2025 1915 by Okey Lauree RAMAN, RN Outcome: Progressing 01/23/2025 1905 by Okey Lauree RAMAN, RN Outcome: Progressing   Problem: Health Behavior/Discharge Planning: Goal: Ability to identify and utilize available resources and services will improve 01/23/2025 1915 by Okey Lauree RAMAN, RN Outcome: Progressing 01/23/2025 1905 by Okey Lauree RAMAN, RN Outcome: Progressing Goal: Ability to manage health-related needs will improve 01/23/2025 1915 by Okey Lauree RAMAN, RN Outcome: Progressing 01/23/2025 1905 by Okey Lauree RAMAN, RN Outcome: Progressing   Problem: Fluid Volume: Goal: Ability to achieve a balanced intake and output will improve 01/23/2025 1915 by Okey Lauree RAMAN, RN Outcome: Progressing 01/23/2025 1905 by Okey Lauree RAMAN, RN Outcome: Progressing   Problem:  Metabolic: Goal: Ability to maintain appropriate glucose levels will improve 01/23/2025 1915 by Okey Lauree RAMAN, RN Outcome: Progressing 01/23/2025 1905 by Okey Lauree RAMAN, RN Outcome: Progressing   Problem: Nutritional: Goal: Maintenance of adequate nutrition will improve 01/23/2025  1915 by Okey Lauree RAMAN, RN Outcome: Progressing 01/23/2025 1905 by Okey Lauree RAMAN, RN Outcome: Progressing Goal: Maintenance of adequate weight for body size and type will improve 01/23/2025 1915 by Okey Lauree RAMAN, RN Outcome: Progressing 01/23/2025 1905 by Okey Lauree RAMAN, RN Outcome: Progressing   Problem: Respiratory: Goal: Will regain and/or maintain adequate ventilation 01/23/2025 1915 by Okey Lauree RAMAN, RN Outcome: Progressing 01/23/2025 1905 by Okey Lauree RAMAN, RN Outcome: Progressing   Problem: Urinary Elimination: Goal: Ability to achieve and maintain adequate renal perfusion and functioning will improve 01/23/2025 1915 by Okey Lauree RAMAN, RN Outcome: Progressing 01/23/2025 1905 by Okey Lauree RAMAN, RN Outcome: Progressing   "

## 2025-01-23 NOTE — Hospital Course (Signed)
 Joseph [name] is an 54 y.o. male past medical history of asthma COPD diabetes mellitus type 2, essential hypertension, PE/DVT on Xarelto , history of PAD status post bilateral BKA, history of CAD with a history of CABG presented hospital initially in the ICU on BiPAP since he was lethargic after cardiac catheterization which was clean.  He also had elevated blood glucose level.  He was admitted to the ICU due to acute metabolic encephalopathy AKI on CKD with hyperglycemia and DKA.  Acute metabolic encephalopathy: Likely due to polypharmacy, CO2 retention, possible DKA.  Medications have been started on a lower dose.    Chronic pain Some narcotic seeking behavior. CT cervical spine unrevealing.  Continue Xanax , Lyrica , oxycodone    Hyperkalemia: Received Lokelma .  Check BMP from today.   Acute kidney injury on chronic kidney disease stage IIIb/anion gap metabolic acidosis: Multifactorial in the setting of urinary retention, hemodynamically mediated and recent contrast exposure.  Nephrology was consulted and latest creatinine of 2.4 on 01/22/2025.  Will repeat BMP  Insulin -dependent diabetes mellitus type 2 with hyperglycemia: Review of last hemoglobin t A1c of 7.9.  Continue sliding scale insulin  and long-acting insulin .   Sepsis secondary to right leg stump infection. Aspiration pneumonia blood culture was likely contaminant.  Completed antibiotics for aspiration pneumonia.   COPD: Continue bronchodilators.  Currently on room air. History of CABG: There was complaint of an anginal pain and underwent cardiac cath which was unrevealing.  Continue aspirin  and Plavix    History of DVT/PE: Not on anticoagulation.  History of remote PE 2014   Depression/anxiety: Continue Wellbutrin  and Lexapro .   Class III obesity. Body mass index is 66.98 kg/m.  Would benefit from lifestyle modification   Stage 1 coccyx and stage 2 bilateral posterior thigh pressure injuries present on admission.   Continue wound care Wound 01/20/25 2030 Pressure Injury Coccyx Posterior;Medial Stage 1 -  Intact skin with non-blanchable redness of a localized area usually over a bony prominence. (Active)     Wound 01/20/25 2030 Pressure Injury Thigh Posterior;Proximal;Right Stage 2 -  Partial thickness loss of dermis presenting as a shallow open injury with a red, pink wound bed without slough. (Active)     Wound 01/20/25 2030 Pressure Injury Thigh Left;Posterior;Proximal Stage 2 -  Partial thickness loss of dermis presenting as a shallow open injury with a red, pink wound bed without slough. (Active)   Deconditioning debility.  Patient has been seen by physical therapy and rec commend skilled nursing facility placement.

## 2025-01-23 NOTE — Progress Notes (Signed)
 Physical Therapy Treatment Patient Details Name: Joseph [name] MRN: [id number] DOB: [birth year] Today's Date: 01/23/2025   History of Present Illness Pt is a 54 y.o. male who presented 01/09/25 with chest pain. EKG showed ST changes in the anterior leads so he was taken to the cath lab, cath showed no acute lesion. Admitted with acute metabolic encephalopathy, hyperglycemia, hyperkalemia, hyponatremia, AKI on CKD, SIRS. PMH: asthma, COPD, chronic bronchitis, morbid obesity, OSA, diabetes, GERD, HTN, HLD, PE, DVT on Xarelto , PAD s/p bilateral BKA, CAD s/p CABG x4, aneurysm of ascending aorta, arthritis, DM2, MI, PE    PT Comments  Patient seemingly depressed with situation though agreeable to move OOB to change soiled bed linen.  Still limited due to painful L shoulder and skin issues on buttocks.  He was able to move to wheelchair with A-P transfer and min A of 2 for safety with wheelchair and for managing linen.  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  Patient may benefit from psychiatric consult.  PT will continue to follow.     If plan is discharge home, recommend the following: Two people to help with walking and/or transfers;A lot of help with bathing/dressing/bathroom;Assist for transportation;Help with stairs or ramp for entrance;Assistance with cooking/housework   Can travel by private vehicle     No  Equipment Recommendations  Wheelchair cushion (measurements PT);Hospital bed;Hoyer lift (pressure relieving cushion, air mattress overlay)    Recommendations for Other Services       Precautions / Restrictions Precautions Precautions: Fall Precaution/Restrictions Comments: hypersensitive to all touch, esp L shoulder; chronic bil BKA; wounds reported on buttocks and noted at BKA     Mobility  Bed Mobility               General bed mobility comments: pt sitting up long sitting in bed on  arrival    Transfers Overall transfer level: Needs assistance   Transfers: Bed to chair/wheelchair/BSC         Anterior-Posterior transfers: +2 safety/equipment, Min assist   General transfer comment: greatly increased time moving in bed to position and scooting back into wheelchair; NT present to change bed linen as periwick leaked and urine soaked bed; back into bed after changing linens with much incresed time and A For safety with cues to lean down on R side for hygiene and to reduce pressure on scrotum, buttocks and due to pt reporting unable to lay on L side due to shoulder pain    Ambulation/Gait                   Stairs             Wheelchair Mobility     Tilt Bed    Modified Rankin (Stroke Patients Only)       Balance Overall balance assessment: Needs assistance   Sitting balance-Leahy Scale: Good Sitting balance - Comments: CGA for recovering from lateral lean                                    Communication Communication Communication: No apparent difficulties  Cognition Arousal: Alert Behavior During Therapy:  (somewhat depressed affect)   PT - Cognitive impairments: Problem solving, Safety/Judgement                       PT -  Cognition Comments: increased time for all mobility due to working to figure how to move without using L arm; excited his blood glucose was in 160's though felt he deserved a regular soda to celebrate Following commands: Impaired Following commands impaired: Only follows one step commands consistently, Follows one step commands with increased time    Cueing    Exercises      General Comments General comments (skin integrity, edema, etc.): errythema B ischium and scrotum, and skin tear on L buttocks; NT to finish bath and place new sacral foam      Pertinent Vitals/Pain Pain Assessment Pain Assessment: Faces Faces Pain Scale: Hurts even more Pain Location: L shoulder, buttocks,  scrotum Pain Descriptors / Indicators: Discomfort, Grimacing, Guarding, Moaning Pain Intervention(s): Monitored during session, Repositioned    Home Living                          Prior Function            PT Goals (current goals can now be found in the care plan section) Progress towards PT goals: Progressing toward goals (slowly)    Frequency    Min 2X/week      PT Plan      Co-evaluation              AM-PAC PT 6 Clicks Mobility   Outcome Measure  Help needed turning from your back to your side while in a flat bed without using bedrails?: A Little Help needed moving from lying on your back to sitting on the side of a flat bed without using bedrails?: A Little Help needed moving to and from a bed to a chair (including a wheelchair)?: A Lot Help needed standing up from a chair using your arms (e.g., wheelchair or bedside chair)?: Total Help needed to walk in hospital room?: Total Help needed climbing 3-5 steps with a railing? : Total 6 Click Score: 11    End of Session   Activity Tolerance: Patient limited by pain;Patient limited by fatigue Patient left: in bed;with nursing/sitter in room   PT Visit Diagnosis: Muscle weakness (generalized) (M62.81);Difficulty in walking, not elsewhere classified (R26.2);Pain;History of falling (Z91.81) Pain - Right/Left: Left Pain - part of body: Shoulder     Time: 8864-8782 PT Time Calculation (min) (ACUTE ONLY): 42 min  Charges:    $Therapeutic Activity: 23-37 mins PT General Charges $$ ACUTE PT VISIT: 1 Visit                     Micheline Portal, PT Acute Rehabilitation Services Office:[phone] 01/23/2025    Montie Portal 01/23/2025, 5:32 PM

## 2025-01-23 NOTE — Plan of Care (Signed)
 " Problem: Education: Goal: Knowledge of General Education information will improve Description: Including pain rating scale, medication(s)/side effects and non-pharmacologic comfort measures Outcome: Progressing   Problem: Health Behavior/Discharge Planning: Goal: Ability to manage health-related needs will improve Outcome: Progressing   Problem: Clinical Measurements: Goal: Ability to maintain clinical measurements within normal limits will improve Outcome: Progressing Goal: Will remain free from infection Outcome: Progressing Goal: Diagnostic test results will improve Outcome: Progressing Goal: Respiratory complications will improve Outcome: Progressing Goal: Cardiovascular complication will be avoided Outcome: Progressing   Problem: Activity: Goal: Risk for activity intolerance will decrease Outcome: Progressing   Problem: Nutrition: Goal: Adequate nutrition will be maintained Outcome: Progressing   Problem: Coping: Goal: Level of anxiety will decrease Outcome: Progressing   Problem: Elimination: Goal: Will not experience complications related to bowel motility Outcome: Progressing Goal: Will not experience complications related to urinary retention Outcome: Progressing   Problem: Pain Managment: Goal: General experience of comfort will improve and/or be controlled Outcome: Progressing   Problem: Safety: Goal: Ability to remain free from injury will improve Outcome: Progressing   Problem: Skin Integrity: Goal: Risk for impaired skin integrity will decrease Outcome: Progressing   Problem: Education: Goal: Understanding of CV disease, CV risk reduction, and recovery process will improve Outcome: Progressing Goal: Individualized Educational Video(s) Outcome: Progressing   Problem: Activity: Goal: Ability to return to baseline activity level will improve Outcome: Progressing   Problem: Cardiovascular: Goal: Ability to achieve and maintain adequate  cardiovascular perfusion will improve Outcome: Progressing Goal: Vascular access site(s) Level 0-1 will be maintained Outcome: Progressing   Problem: Health Behavior/Discharge Planning: Goal: Ability to safely manage health-related needs after discharge will improve Outcome: Progressing   Problem: Education: Goal: Ability to describe self-care measures that may prevent or decrease complications (Diabetes Survival Skills Education) will improve Outcome: Progressing Goal: Individualized Educational Video(s) Outcome: Progressing   Problem: Coping: Goal: Ability to adjust to condition or change in health will improve Outcome: Progressing   Problem: Fluid Volume: Goal: Ability to maintain a balanced intake and output will improve Outcome: Progressing   Problem: Health Behavior/Discharge Planning: Goal: Ability to identify and utilize available resources and services will improve Outcome: Progressing Goal: Ability to manage health-related needs will improve Outcome: Progressing   Problem: Metabolic: Goal: Ability to maintain appropriate glucose levels will improve Outcome: Progressing   Problem: Nutritional: Goal: Maintenance of adequate nutrition will improve Outcome: Progressing Goal: Progress toward achieving an optimal weight will improve Outcome: Progressing   Problem: Skin Integrity: Goal: Risk for impaired skin integrity will decrease Outcome: Progressing   Problem: Tissue Perfusion: Goal: Adequacy of tissue perfusion will improve Outcome: Progressing   Problem: Education: Goal: Ability to describe self-care measures that may prevent or decrease complications (Diabetes Survival Skills Education) will improve Outcome: Progressing Goal: Individualized Educational Video(s) Outcome: Progressing   Problem: Cardiac: Goal: Ability to maintain an adequate cardiac output will improve Outcome: Progressing   Problem: Health Behavior/Discharge Planning: Goal: Ability to  identify and utilize available resources and services will improve Outcome: Progressing Goal: Ability to manage health-related needs will improve Outcome: Progressing   Problem: Fluid Volume: Goal: Ability to achieve a balanced intake and output will improve Outcome: Progressing   Problem: Metabolic: Goal: Ability to maintain appropriate glucose levels will improve Outcome: Progressing   Problem: Nutritional: Goal: Maintenance of adequate nutrition will improve Outcome: Progressing Goal: Maintenance of adequate weight for body size and type will improve Outcome: Progressing   Problem: Respiratory: Goal: Will regain and/or maintain adequate ventilation  Outcome: Progressing   Problem: Urinary Elimination: Goal: Ability to achieve and maintain adequate renal perfusion and functioning will improve Outcome: Progressing   "

## 2025-01-24 ENCOUNTER — Inpatient Hospital Stay (HOSPITAL_COMMUNITY)

## 2025-01-24 LAB — BASIC METABOLIC PANEL WITH GFR
Anion gap: 11 (ref 5–15)
Anion gap: 12 (ref 5–15)
BUN: 82 mg/dL — ABNORMAL HIGH (ref 6–20)
BUN: 82 mg/dL — ABNORMAL HIGH (ref 6–20)
CO2: 19 mmol/L — ABNORMAL LOW (ref 22–32)
CO2: 21 mmol/L — ABNORMAL LOW (ref 22–32)
Calcium: 8.9 mg/dL (ref 8.9–10.3)
Calcium: 9.3 mg/dL (ref 8.9–10.3)
Chloride: 104 mmol/L (ref 98–111)
Chloride: 103 mmol/L (ref 98–111)
Creatinine, Ser: 2.3 mg/dL — ABNORMAL HIGH (ref 0.61–1.24)
Creatinine, Ser: 2.51 mg/dL — ABNORMAL HIGH (ref 0.61–1.24)
GFR, Estimated: 34 mL/min — ABNORMAL LOW
GFR, Estimated: 30 mL/min — ABNORMAL LOW
Glucose, Bld: 140 mg/dL — ABNORMAL HIGH (ref 70–99)
Glucose, Bld: 167 mg/dL — ABNORMAL HIGH (ref 70–99)
Potassium: 6.2 mmol/L — ABNORMAL HIGH (ref 3.5–5.1)
Potassium: 5.6 mmol/L — ABNORMAL HIGH (ref 3.5–5.1)
Sodium: 134 mmol/L — ABNORMAL LOW (ref 135–145)
Sodium: 135 mmol/L (ref 135–145)

## 2025-01-24 LAB — GLUCOSE, CAPILLARY
Glucose-Capillary: 139 mg/dL — ABNORMAL HIGH (ref 70–99)
Glucose-Capillary: 141 mg/dL — ABNORMAL HIGH (ref 70–99)
Glucose-Capillary: 181 mg/dL — ABNORMAL HIGH (ref 70–99)
Glucose-Capillary: 147 mg/dL — ABNORMAL HIGH (ref 70–99)

## 2025-01-24 MED ORDER — OXYCODONE HCL 5 MG PO TABS
5.0000 mg | ORAL_TABLET | ORAL | Status: DC | PRN
  Administered 2025-01-24: 5 mg via ORAL
  Filled 2025-01-24: qty 1

## 2025-01-24 MED ORDER — PREGABALIN 100 MG PO CAPS
300.0000 mg | ORAL_CAPSULE | Freq: Two times a day (BID) | ORAL | Status: AC
  Administered 2025-01-24 – 2025-01-25 (×3): 300 mg via ORAL
  Filled 2025-01-24 (×3): qty 3

## 2025-01-24 MED ORDER — OXYCODONE HCL 5 MG PO TABS
15.0000 mg | ORAL_TABLET | ORAL | Status: AC | PRN
  Administered 2025-01-24 – 2025-01-25 (×6): 15 mg via ORAL
  Filled 2025-01-24 (×6): qty 3

## 2025-01-24 MED ORDER — LACTATED RINGERS IV SOLN: INTRAVENOUS | Status: DC

## 2025-01-24 MED ORDER — SODIUM ZIRCONIUM CYCLOSILICATE 10 G PO PACK
10.0000 g | PACK | Freq: Three times a day (TID) | ORAL | Status: DC
  Administered 2025-01-24 (×3): 10 g via ORAL
  Filled 2025-01-24 (×5): qty 1

## 2025-01-24 NOTE — Progress Notes (Signed)
 Occupational Therapy Treatment Patient Details Name: Joseph [name] MRN: [id number] DOB: 1971/10/10 Today's Date: 01/24/2025   History of present illness Pt is a 54 y.o. male who presented 01/09/25 with chest pain. EKG showed ST changes in the anterior leads so he was taken to the cath lab, cath showed no acute lesion. Admitted with acute metabolic encephalopathy, hyperglycemia, hyperkalemia, hyponatremia, AKI on CKD, SIRS. PMH: asthma, COPD, chronic bronchitis, morbid obesity, OSA, diabetes, GERD, HTN, HLD, PE, DVT on Xarelto , PAD s/p bilateral BKA, CAD s/p CABG x4, aneurysm of ascending aorta, arthritis, DM2, MI, PE   OT comments  Pt seen for OT treatment this PM. Wife on phone, concerned about pt's use of LUE. OT assessed LUE, pt stating it feels like it is on backwards, unable to elaborate. Noted very limited wrist, elbow, and shoulder mobility (significantly reduced from prior OT visit on 1/28). Unable to use functionally. RN alerted, going to relay onto MD. Completed Short Blessed Test with pt- he scored 17/28, indicating moderate cognitive impairment. Continues to demonstrate deficits related to attention, insight, and reasoning. Wife reporting pt's cognition is not at baseline but has improved since hospitalization. Pt labile throughout session, becoming frustrated with cog testing, needs redirection. No changes in d/c recommendations (post-acute rehab <3 hrs/day). OT to continue to follow and progress as able.      If plan is discharge home, recommend the following:  Two people to help with walking and/or transfers;Two people to help with bathing/dressing/bathroom;Assistance with cooking/housework;Assistance with feeding;Assist for transportation;Help with stairs or ramp for entrance   Equipment Recommendations  Hospital bed    Recommendations for Other Services      Precautions / Restrictions Precautions Precautions: Fall Precaution/Restrictions Comments: hypersensitive to all touch,  esp L shoulder; chronic bil BKA; wounds reported on buttocks and noted at BKA Restrictions Weight Bearing Restrictions Per Provider Order: No       Mobility Bed Mobility               General bed mobility comments: not assessed, pt sitting in long sitting upright in bed, stating he ain't doing sh** today    Transfers                   General transfer comment: NT     Balance                                           ADL either performed or assessed with clinical judgement   ADL Overall ADL's : Needs assistance/impaired Eating/Feeding: Independent;Sitting;Bed level Eating/Feeding Details (indicate cue type and reason): long sitting in bed, feeds self indep                                        Extremity/Trunk Assessment Upper Extremity Assessment Upper Extremity Assessment: Right hand dominant;LUE deficits/detail LUE Deficits / Details: pt stating his LUE feels like it's on backwards, unable to elaborate; painful with any mobility, reports baseline neuropathy in BUE; observed minimal wrist AROM, elbow flexion/ext very limited, unable to flex shoulder antigravity (major change in status since previous OT session on 1/28), RN alerted and aware, stating she will relay info to MD. Pt unable to use LUE functionally; grip strength ~2/5 LUE Sensation: decreased light touch;history of peripheral neuropathy LUE Coordination: decreased fine  motor;decreased gross Animator Communication: No apparent difficulties   Cognition Arousal: Alert Behavior During Therapy: Lability (appeared depressed) Cognition: Cognition impaired   Orientation impairments: Situation (pt with min recall of hospitalization and recent events) Awareness: Online awareness impaired Memory impairment (select all impairments): Declarative long-term memory, Short-term memory,  Working memory Attention impairment (select first level of impairment): Sustained attention Executive functioning impairment (select all impairments): Organization, Reasoning, Problem solving OT - Cognition Comments: pt labile at times, very distractable and tangential (suspect partially baseline), wife on phone reports his cognition is still impaired but is improving since hospitalization                 Following commands: Impaired Following commands impaired: Only follows one step commands consistently, Follows one step commands with increased time      Cueing   Cueing Techniques: Verbal cues, Tactile cues, Gestural cues  Exercises      Shoulder Instructions       General Comments aunt and neice present in room, wife on phone throughout session    Pertinent Vitals/ Pain       Pain Assessment Pain Assessment: Faces Faces Pain Scale: Hurts whole lot Pain Location: L shoulder, entire LUE Pain Descriptors / Indicators: Discomfort, Grimacing, Guarding, Moaning Pain Intervention(s): Monitored during session, Limited activity within patient's tolerance, Other (comment) (RN notified)  Home Living                                          Prior Functioning/Environment              Frequency  Min 2X/week        Progress Toward Goals  OT Goals(current goals can now be found in the care plan section)  Progress towards OT goals: Not progressing toward goals - comment (limited by pain and cognitive deficits)     Plan      Co-evaluation                 AM-PAC OT 6 Clicks Daily Activity     Outcome Measure   Help from another person eating meals?: None Help from another person taking care of personal grooming?: A Lot Help from another person toileting, which includes using toliet, bedpan, or urinal?: Total Help from another person bathing (including washing, rinsing, drying)?: A Lot Help from another person to put on and taking off  regular upper body clothing?: A Lot Help from another person to put on and taking off regular lower body clothing?: Total 6 Click Score: 12    End of Session    OT Visit Diagnosis: Unsteadiness on feet (R26.81);Other abnormalities of gait and mobility (R26.89);Muscle weakness (generalized) (M62.81);Pain   Activity Tolerance Patient limited by pain   Patient Left in bed;with call bell/phone within reach;with bed alarm set;with family/visitor present   Nurse Communication Other (comment) (LUE limitations and pt wanting to speak with RN)        Time: 8453-8389 OT Time Calculation (min): 24 min  Charges: OT General Charges $OT Visit: 1 Visit OT Treatments $Therapeutic Activity: 23-37 mins  Joseph [name] M. [place], OTR/L [facility] Acute Rehabilitation Services [phone] Secure Chat Preferred  [place] 01/24/2025, 5:02 PM

## 2025-01-24 NOTE — Progress Notes (Addendum)
 " PROGRESS NOTE  Joseph [name] FMW:[id number] DOB: 10/14/1971 DOA: 01/09/2025 PCP: Alec House, MD   LOS: 15 days   Brief narrative:  Joseph [name] is an 54 y.o. male past medical history of asthma COPD diabetes mellitus type 2, essential hypertension, PE/DVT on Xarelto , history of PAD status post bilateral BKA, history of CAD with a history of CABG presented hospital initially in the ICU on BiPAP since he was lethargic after cardiac catheterization which was clean.  He also had elevated blood glucose level.  He was admitted to the ICU due to acute metabolic encephalopathy, AKI on CKD with hyperglycemia and DKA.     Assessment/Plan: Principal Problem:   Acute metabolic encephalopathy Active Problems:   Acute urinary retention   BMI 70 and over, adult (HCC)   Acute kidney injury superimposed on stage 3b chronic kidney disease (HCC)   Essential hypertension   Insulin  dependent type 2 diabetes mellitus (HCC)   Hyponatremia   Chest pain  Acute metabolic encephalopathy: Thought to be secondary to polypharmacy.  Medications have been started on a lower dose.   Patient was alert but then again has been having encephalopathy.  Will decrease narcotic dose to 5 mg every 4 hours hold for sedation, discontinue Lyrica  100 mg twice daily, discontinue Xanax  0.5 mg twice daily for now.  Continue aspiration precautions, has not had much to eat.  Will continue with IV fluids.  Will get ABG today to rule out CO2 retention..  Ammonia level 2 weeks back was 17.  Will add CT head scan as well  Chronic pain Some narcotic seeking behavior. CT cervical spine unrevealing.  Will hold off with Xanax , Lyrica , decrease dose of oxycodone  and hold for sedation.  Currently somnolent    Hyperkalemia: Potassium today at 6.2 and rising trend.  Creatinine at 2.3.  Will continue to treat potassium with Lokelma .  Will increase Lokelma  to 3 times daily.   Acute kidney injury on chronic kidney disease stage IIIb/anion gap  metabolic acidosis: Multifactorial in the setting of urinary retention, hemodynamically mediated and recent contrast exposure.  Nephrology was consulted and latest creatinine of 2.3 from 2.4 yesterday.  Will continue to monitor closely.  Insulin -dependent diabetes mellitus type 2 with hyperglycemia: Review of last hemoglobin t A1c of 7.9.  Continue sliding scale insulin  and long-acting insulin .   Sepsis secondary to right leg stump infection. Aspiration pneumonia blood culture was likely contaminant.  Completed antibiotics for aspiration pneumonia.   COPD: Continue bronchodilators.  Currently on room air.  Does not seem to be in respiratory distress but will get ABG.  History of CABG: There was complaint of an anginal pain and underwent cardiac cath which was unrevealing.  Continue aspirin  and Plavix    History of DVT/PE: Not on anticoagulation.  History of remote PE 2014   Depression/anxiety: Continue Wellbutrin  and Lexapro .  Hold Lyrica  for now.   Class III obesity. Body mass index is 66.98 kg/m.  Would benefit from lifestyle modification   Stage 1 coccyx and stage 2 bilateral posterior thigh pressure injuries present on admission.  Continue wound care Wound 01/20/25 2030 Pressure Injury Coccyx Posterior;Medial Stage 1 -  Intact skin with non-blanchable redness of a localized area usually over a bony prominence. (Active)     Wound 01/20/25 2030 Pressure Injury Thigh Posterior;Proximal;Right Stage 2 -  Partial thickness loss of dermis presenting as a shallow open injury with a red, pink wound bed without slough. (Active)     Wound 01/20/25 2030 Pressure  Injury Thigh Left;Posterior;Proximal Stage 2 -  Partial thickness loss of dermis presenting as a shallow open injury with a red, pink wound bed without slough. (Active)   Deconditioning debility, history of bilateral below-knee amputation patient has been seen by physical therapy and rec commend skilled nursing facility  placement.  DVT prophylaxis: heparin  injection 5,000 Units Start: 01/09/25 2200   Disposition: Skilled nursing facility in 1 to 2 days  Status is: Inpatient Remains inpatient appropriate because: Hyperkalemia, encephalopathy, pending clinical improvement, need for rehabilitation.    Code Status:     Code Status: Full Code  Family Communication: None at bedside.I spoke with the patient's spouse on the phone and updated her about the clinical condition of the patient.   Consultants: None  Procedures: None  Anti-infectives:  None currently   Subjective: Today, patient was seen and examined at bedside.  Nursing staff reported that patient was somnolent and lethargic at times.  Had something to eat yesterday.  No mention of any nausea vomiting fever chills or rigor.   Objective: Vitals:   01/24/25 0234 01/24/25 0845  BP: 117/88 95/60  Pulse: 88 91  Resp: 11 16  Temp: 98.6 F (37 C) 99.3 F (37.4 C)  SpO2: 96% 90%    Intake/Output Summary (Last 24 hours) at 01/24/2025 1053 Last data filed at 01/24/2025 0500 Gross per 24 hour  Intake --  Output 1800 ml  Net -1800 ml   Filed Weights   01/20/25 0307 01/22/25 0500 01/24/25 0500  Weight: (!) 164.2 kg (!) 160.8 kg (!) 163.7 kg   Body mass index is 68.19 kg/m.   Physical Exam: GENERAL: Patient somnolent, slurred speech while talking but responsive.  Not in respiratory distress.  Not in obvious distress.  Obese HENT: No scleral pallor or icterus. Pupils equally reactive to light. Oral mucosa is moist NECK: is supple, no gross swelling noted. CHEST: Clear to auscultation. No crackles or wheezes.  CVS: S1 and S2 heard, no murmur. Regular rate and rhythm.  ABDOMEN: Soft, non-tender, bowel sounds are present. EXTREMITIES: Bilateral below-knee amputation. CNS: Somnolent SKIN: warm and dry  Data Review: I have personally reviewed the following laboratory data and studies,  CBC: Recent Labs  Lab 01/20/25 1932  WBC  11.4*  NEUTROABS 8.5*  HGB 9.0*  HCT 29.3*  MCV 84.9  PLT 565*   Basic Metabolic Panel: Recent Labs  Lab 01/20/25 1932 01/22/25 0951 01/23/25 0749 01/24/25 0822  NA 132* 132* 131* 134*  K 5.2* 5.6* 6.1* 6.2*  CL 99 101 101 104  CO2 20* 20* 20* 19*  GLUCOSE 155* 152* 229* 140*  BUN 103* 89* 90* 82*  CREATININE 2.74* 2.41* 2.32* 2.30*  CALCIUM  9.6 9.3 9.0 8.9   Liver Function Tests: No results for input(s): AST, ALT, ALKPHOS, BILITOT, PROT, ALBUMIN  in the last 168 hours. No results for input(s): LIPASE, AMYLASE in the last 168 hours. No results for input(s): AMMONIA in the last 168 hours. Cardiac Enzymes: No results for input(s): CKTOTAL, CKMB, CKMBINDEX, TROPONINI in the last 168 hours. BNP (last 3 results) Recent Labs    03/27/24 2210  BNP 90.4    ProBNP (last 3 results) Recent Labs    04/27/24 1051 12/17/24 1056 01/10/25 1007  PROBNP 895* 285* 576.0*    CBG: Recent Labs  Lab 01/23/25 0750 01/23/25 1120 01/23/25 1642 01/23/25 2127 01/24/25 0840  GLUCAP 206* 136* 217* 208* 139*   No results found for this or any previous visit (from the past 240 hours).  Studies: No results found.    Cristian Davitt, MD  [facility] 01/24/2025  If 7PM-7AM, please contact night-coverage             "

## 2025-01-24 NOTE — Progress Notes (Signed)
 Pt off the unit to CT.

## 2025-01-24 NOTE — Progress Notes (Signed)
 Pt back from CT

## 2025-01-24 NOTE — Progress Notes (Signed)
 Patient is now awake & refusing ABG. Attending MD was made aware.

## 2025-01-24 NOTE — Progress Notes (Signed)
 RN paged MD Pokhrel about the pt Bp 94/60(74) and him being lethargic. MD called back at 9:02 and is coming to see the pt.

## 2025-01-24 NOTE — Inpatient Diabetes Management (Signed)
 Inpatient Diabetes Program Recommendations  AACE/ADA: New Consensus Statement on Inpatient Glycemic Control   Target Ranges:  Prepandial:   less than 140 mg/dL      Peak postprandial:   less than 180 mg/dL (1-2 hours)      Critically ill patients:  140 - 180 mg/dL   Lab Results  Component Value Date   GLUCAP 139 (H) 01/24/2025   HGBA1C 7.9 (H) 01/09/2025    Latest Reference Range & Units 01/23/25 07:50 01/23/25 11:20 01/23/25 16:42 01/23/25 21:27 01/24/25 08:40  Glucose-Capillary 70 - 99 mg/dL 793 (H) 863 (H) 782 (H) 208 (H) 139 (H)   Review of Glycemic Control  Diabetes history: DM2  Outpatient Diabetes medications:  Lantus  50 units BID  Humalog  12 units TID  Metformin  1,000mg  daily   Current orders for Inpatient glycemic control:  Lantus  60 units TID  Novolog  0-15 units TID + 0-5 units at bedtime  Novolog  6 units TID   Inpatient Diabetes Program Recommendations:   Please consider:  Increase meal coverage to Novolog  7 units TID   Thanks,  Joseph Search, RN, MSN, [facility]  Inpatient Diabetes Coordinator  Pager [phone] (8a-5p)

## 2025-01-25 LAB — BASIC METABOLIC PANEL WITH GFR
Anion gap: 10 (ref 5–15)
BUN: 78 mg/dL — ABNORMAL HIGH (ref 6–20)
CO2: 23 mmol/L (ref 22–32)
Calcium: 9.2 mg/dL (ref 8.9–10.3)
Chloride: 101 mmol/L (ref 98–111)
Creatinine, Ser: 2.45 mg/dL — ABNORMAL HIGH (ref 0.61–1.24)
GFR, Estimated: 31 mL/min — ABNORMAL LOW
Glucose, Bld: 206 mg/dL — ABNORMAL HIGH (ref 70–99)
Potassium: 5.5 mmol/L — ABNORMAL HIGH (ref 3.5–5.1)
Sodium: 133 mmol/L — ABNORMAL LOW (ref 135–145)

## 2025-01-25 LAB — MAGNESIUM: Magnesium: 1.9 mg/dL (ref 1.7–2.4)

## 2025-01-25 LAB — GLUCOSE, CAPILLARY
Glucose-Capillary: 174 mg/dL — ABNORMAL HIGH (ref 70–99)
Glucose-Capillary: 119 mg/dL — ABNORMAL HIGH (ref 70–99)
Glucose-Capillary: 193 mg/dL — ABNORMAL HIGH (ref 70–99)
Glucose-Capillary: 260 mg/dL — ABNORMAL HIGH (ref 70–99)

## 2025-01-25 LAB — CBC
HCT: 29.2 % — ABNORMAL LOW (ref 39.0–52.0)
Hemoglobin: 9 g/dL — ABNORMAL LOW (ref 13.0–17.0)
MCH: 26.5 pg (ref 26.0–34.0)
MCHC: 30.8 g/dL (ref 30.0–36.0)
MCV: 85.9 fL (ref 80.0–100.0)
Platelets: 531 10*3/uL — ABNORMAL HIGH (ref 150–400)
RBC: 3.4 MIL/uL — ABNORMAL LOW (ref 4.22–5.81)
RDW: 15.2 % (ref 11.5–15.5)
WBC: 10.9 10*3/uL — ABNORMAL HIGH (ref 4.0–10.5)
nRBC: 0 % (ref 0.0–0.2)

## 2025-01-25 LAB — PHOSPHORUS: Phosphorus: 4.2 mg/dL (ref 2.5–4.6)

## 2025-01-25 MED ORDER — SODIUM ZIRCONIUM CYCLOSILICATE 10 G PO PACK
10.0000 g | PACK | Freq: Every day | ORAL | Status: AC
  Administered 2025-01-25: 10 g via ORAL
  Filled 2025-01-25: qty 1

## 2025-01-25 NOTE — Progress Notes (Signed)
 Pt. Family assisting pt. With placing on cpap tonight.

## 2025-01-25 NOTE — Plan of Care (Signed)
   Problem: Education: Goal: Knowledge of General Education information will improve Description: Including pain rating scale, medication(s)/side effects and non-pharmacologic comfort measures Outcome: Progressing   Problem: Activity: Goal: Risk for activity intolerance will decrease Outcome: Progressing   Problem: Pain Managment: Goal: General experience of comfort will improve and/or be controlled Outcome: Progressing   Problem: Skin Integrity: Goal: Risk for impaired skin integrity will decrease Outcome: Progressing

## 2025-01-25 NOTE — Progress Notes (Signed)
" °   01/25/25 0045  BiPAP/CPAP/SIPAP  BiPAP/CPAP/SIPAP Pt Type Adult  BiPAP/CPAP/SIPAP Resmed  Mask Type Full face mask  Mask Size Large  EPAP 10 cmH2O  Patient Home Machine No  Patient Home Mask No  Patient Home Tubing No  Auto Titrate No  Device Plugged into RED Power Outlet Yes    "

## 2025-01-25 NOTE — Progress Notes (Signed)
 Physical Therapy Treatment Patient Details Name: Joseph [name] MRN: [id number] DOB: [birth year] Today's Date: 01/25/2025   History of Present Illness Pt is a 54 y.o. male who presented 01/09/25 with chest pain. EKG showed ST changes in the anterior leads so he was taken to the cath lab, cath showed no acute lesion. Admitted with acute metabolic encephalopathy, hyperglycemia, hyperkalemia, hyponatremia, AKI on CKD, SIRS. PMH: asthma, COPD, chronic bronchitis, morbid obesity, OSA, diabetes, GERD, HTN, HLD, PE, DVT on Xarelto , PAD s/p bilateral BKA, CAD s/p CABG x4, aneurysm of ascending aorta, arthritis, DM2, MI, PE    PT Comments  Patient progressing and met  3/5 LTG.  He was able to complete in bed therex with cues despite painful R knee and L shoulder.  Still concern for damage to L shoulder since he cannot tolerate much movement at all.  Patient appropriate for post-acute inpatient rehab.  PT will continue to follow, goals updated today.    If plan is discharge home, recommend the following: Two people to help with walking and/or transfers;A lot of help with bathing/dressing/bathroom;Assist for transportation;Help with stairs or ramp for entrance;Assistance with cooking/housework   Can travel by private vehicle     No  Equipment Recommendations  Wheelchair cushion (measurements PT);Hospital bed;Hoyer lift    Recommendations for Other Services       Precautions / Restrictions Precautions Precautions: Fall Precaution/Restrictions Comments: hypersensitive to all touch, esp L shoulder; chronic bil BKA; wounds reported on buttocks and noted at BKA     Mobility  Bed Mobility               General bed mobility comments: in long sitting, agreeable to bed level therex    Transfers                        Ambulation/Gait                   Stairs             Wheelchair Mobility     Tilt Bed    Modified Rankin (Stroke Patients Only)       Balance  Overall balance assessment: Needs assistance   Sitting balance-Leahy Scale: Good                                      Communication Communication Communication: No apparent difficulties  Cognition Arousal: Alert Behavior During Therapy: WFL for tasks assessed/performed                             Following commands: Intact      Cueing    Exercises Amputee Exercises Quad Sets: AROM, 10 reps, Left (x 5 sec hold) Gluteal Sets: Strengthening, 10 reps, Both (x 5 sec hold) Towel Squeeze: Strengthening, 10 reps, Both (5 sec hold) Other Exercises Other Exercises: supine scapular squeezes w/ towel roll at spine x 5 reps 5 sec hold (limited for pain L shoulder and cues for less upper trap activation) Other Exercises: partial sit ups x 10    General Comments General comments (skin integrity, edema, etc.): showed photos from his phone of family members, dog, etc; 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      Pertinent Vitals/Pain Pain Assessment Pain Assessment: Faces Faces Pain Scale: Hurts whole lot Pain Location: shoulders L>>R Pain  Descriptors / Indicators: Discomfort, Grimacing, Guarding, Moaning Pain Intervention(s): Monitored during session, Repositioned, Limited activity within patient's tolerance    Home Living                          Prior Function            PT Goals (current goals can now be found in the care plan section) Acute Rehab PT Goals Patient Stated Goal: heal wounds PT Goal Formulation: With patient/family Time For Goal Achievement: 02/08/25 Progress towards PT goals: Progressing toward goals;Goals met and updated - see care plan    Frequency    Min 2X/week      PT Plan      Co-evaluation              AM-PAC PT 6 Clicks Mobility   Outcome Measure  Help needed turning from your back to your side while in a flat bed without using bedrails?: A Little Help needed moving from lying on  your back to sitting on the side of a flat bed without using bedrails?: A Little Help needed moving to and from a bed to a chair (including a wheelchair)?: A Lot Help needed standing up from a chair using your arms (e.g., wheelchair or bedside chair)?: Total Help needed to walk in hospital room?: Total Help needed climbing 3-5 steps with a railing? : Total 6 Click Score: 11    End of Session   Activity Tolerance: Patient limited by pain Patient left: in bed;with call bell/phone within reach   PT Visit Diagnosis: Muscle weakness (generalized) (M62.81);Difficulty in walking, not elsewhere classified (R26.2);Pain;History of falling (Z91.81) Pain - Right/Left: Left Pain - part of body: Shoulder     Time: 1540-1600 PT Time Calculation (min) (ACUTE ONLY): 20 min  Charges:    $Therapeutic Exercise: 8-22 mins PT General Charges $$ ACUTE PT VISIT: 1 Visit                     Micheline Portal, PT Acute Rehabilitation Services Office:[phone] 01/25/2025    Montie Portal 01/25/2025, 4:56 PM

## 2025-01-25 NOTE — Progress Notes (Signed)
 " PROGRESS NOTE  Joseph [name] FMW:[id number] DOB: [birth year] DOA: 01/09/2025 PCP: Alec House, MD   LOS: 16 days   Brief narrative:  Joseph [name] is an 54 y.o. male past medical history of asthma COPD diabetes mellitus type 2, essential hypertension, PE/DVT on Xarelto , history of PAD status post bilateral BKA, history of CAD with a history of CABG presented hospital initially in the ICU on BiPAP since he was lethargic after cardiac catheterization which was clean.  He also had elevated blood glucose level.  He was admitted to the ICU due to acute metabolic encephalopathy, AKI on CKD with hyperglycemia and DKA.     Assessment/Plan: Principal Problem:   Acute metabolic encephalopathy Active Problems:   Acute urinary retention   BMI 70 and over, adult (HCC)   Acute kidney injury superimposed on stage 3b chronic kidney disease (HCC)   Essential hypertension   Insulin  dependent type 2 diabetes mellitus (HCC)   Hyponatremia   Chest pain  Acute metabolic encephalopathy: Thought to be secondary to polypharmacy.Initially medications were cut down but have been resumed on his home doses at this time.  Patient declined ABG.  CT head scan was negative.  Ammonia level 2 weeks back was 17.    Chronic pain Been on narcotics for several years.  CT cervical spine unrevealing.  Will hold off with Xanax , resumed oxycodone  and Lyrica .  Hyperkalemia: Potassium today at 5.5 after Lokelma  3 times daily yesterday.  Will continue Lokelma  daily.    Acute kidney injury on chronic kidney disease stage IIIb/anion gap metabolic acidosis: Multifactorial in the setting of urinary retention, hemodynamically mediated and recent contrast exposure.  Nephrology was consulted and latest creatinine of 2.4 from 2.4 yesterday.  Will continue to monitor closely.  Insulin -dependent diabetes mellitus type 2 with hyperglycemia: Review of last hemoglobin A1c of 7.9.  Continue sliding scale insulin  and long-acting  insulin .  Latest POC glucose of 119   Sepsis secondary to right leg stump infection. Aspiration pneumonia blood culture was likely contaminant.  Completed antibiotics for aspiration pneumonia.   COPD: Continue bronchodilators.  Currently on room air.  Declined ABG  History of CABG: There was complaint of an anginal pain and underwent cardiac cath which was unrevealing.  Continue aspirin  and Plavix .  No further chest pain   History of DVT/PE: Not on anticoagulation.  History of remote PE 2014   Depression/anxiety: Continue Wellbutrin  and Lexapro .  Continue Lyrica    Class III obesity. Body mass index is 66.98 kg/m.  Would benefit from lifestyle modification   Stage 1 coccyx and stage 2 bilateral posterior thigh pressure injuries present on admission.  Continue wound care Wound 01/20/25 2030 Pressure Injury Coccyx Posterior;Medial Stage 1 -  Intact skin with non-blanchable redness of a localized area usually over a bony prominence. (Active)     Wound 01/20/25 2030 Pressure Injury Thigh Posterior;Proximal;Right Stage 2 -  Partial thickness loss of dermis presenting as a shallow open injury with a red, pink wound bed without slough. (Active)     Wound 01/20/25 2030 Pressure Injury Thigh Left;Posterior;Proximal Stage 2 -  Partial thickness loss of dermis presenting as a shallow open injury with a red, pink wound bed without slough. (Active)   Deconditioning debility, history of bilateral below-knee amputation patient has been seen by physical therapy and rec commend skilled nursing facility placement.  History of obstructive sleep apnea.  Was supposed to be on CPAP but was not using it.  Did try CPAP yesterday night for few  hours.  DVT prophylaxis: heparin  injection 5,000 Units Start: 01/09/25 2200   Disposition: Skilled nursing facility in 1 to 2 days medically stable for disposition  Status is: Inpatient Remains inpatient appropriate because: Hyperkalemia, encephalopathy need for  rehabilitation.    Code Status:     Code Status: Full Code  Family Communication: .I spoke with the patient's spouse on the phone on 01/24/2025  Consultants: None  Procedures: None  Anti-infectives:  None currently   Subjective: Today, patient was seen and examined at bedside.  Patient states that he feels okay.  Denies any shortness of breath dyspnea chest pain palpitation.  He tried CPAP for some time yesterday but had some headache.  Objective: Vitals:   01/24/25 2311 01/25/25 1205  BP: 90/60 111/69  Pulse: 94 88  Resp: 14 16  Temp: 97.9 F (36.6 C) 98.5 F (36.9 C)  SpO2: 97% 92%    Intake/Output Summary (Last 24 hours) at 01/25/2025 1335 Last data filed at 01/24/2025 1945 Gross per 24 hour  Intake --  Output 600 ml  Net -600 ml   Filed Weights   01/20/25 0307 01/22/25 0500 01/24/25 0500  Weight: (!) 164.2 kg (!) 160.8 kg (!) 163.7 kg   Body mass index is 68.19 kg/m.   Physical Exam:  General:  Average built, not in obvious distress, obese build, on room air. HENT:   No scleral pallor or icterus noted. Oral mucosa is moist.  Chest: Diminished breath sounds bilaterally CVS: S1 &S2 heard. No murmur.  Regular rate and rhythm. Abdomen: Soft, nontender, nondistended.  Bowel sounds are heard.   Extremities: No cyanosis, clubbing with bilateral below-knee amputation.  Peripheral pulses are palpable. Psych: Alert, awake and oriented, normal mood CNS:  No cranial nerve deficits.   Skin: Warm and dry.  No rashes noted.   Data Review: I have personally reviewed the following laboratory data and studies,  CBC: Recent Labs  Lab 01/20/25 1932 01/25/25 0521  WBC 11.4* 10.9*  NEUTROABS 8.5*  --   HGB 9.0* 9.0*  HCT 29.3* 29.2*  MCV 84.9 85.9  PLT 565* 531*   Basic Metabolic Panel: Recent Labs  Lab 01/22/25 0951 01/23/25 0749 01/24/25 0822 01/24/25 1816 01/25/25 0521  NA 132* 131* 134* 135 133*  K 5.6* 6.1* 6.2* 5.6* 5.5*  CL 101 101 104 103 101  CO2  20* 20* 19* 21* 23  GLUCOSE 152* 229* 140* 167* 206*  BUN 89* 90* 82* 82* 78*  CREATININE 2.41* 2.32* 2.30* 2.51* 2.45*  CALCIUM  9.3 9.0 8.9 9.3 9.2  MG  --   --   --   --  1.9  PHOS  --   --   --   --  4.2   Liver Function Tests: No results for input(s): AST, ALT, ALKPHOS, BILITOT, PROT, ALBUMIN  in the last 168 hours. No results for input(s): LIPASE, AMYLASE in the last 168 hours. No results for input(s): AMMONIA in the last 168 hours. Cardiac Enzymes: No results for input(s): CKTOTAL, CKMB, CKMBINDEX, TROPONINI in the last 168 hours. BNP (last 3 results) Recent Labs    03/27/24 2210  BNP 90.4    ProBNP (last 3 results) Recent Labs    04/27/24 1051 12/17/24 1056 01/10/25 1007  PROBNP 895* 285* 576.0*    CBG: Recent Labs  Lab 01/24/25 1302 01/24/25 1645 01/24/25 2118 01/25/25 0749 01/25/25 1203  GLUCAP 141* 181* 147* 174* 119*   No results found for this or any previous visit (from the past 240  hours).   Studies: CT HEAD WO CONTRAST ( ) Result Date: 01/24/2025 EXAM: CT HEAD WITHOUT CONTRAST 01/24/2025 05:49:58 PM TECHNIQUE: CT of the head was performed without the administration of intravenous contrast. Automated exposure control, iterative reconstruction, and/or weight based adjustment of the mA/kV was utilized to reduce the radiation dose to as low as reasonably achievable. COMPARISON: 07/17/2024 CLINICAL HISTORY: Confusion. FINDINGS: BRAIN AND VENTRICLES: No acute hemorrhage. No evidence of acute infarct. No hydrocephalus. No extra-axial collection. No mass effect or midline shift. SINUSES: No acute abnormality. SOFT TISSUES AND SKULL: No acute soft tissue abnormality. No skull fracture. IMPRESSION: 1. No acute intracranial abnormality. Electronically signed by: Franky Stanford MD 01/24/2025 07:38 PM EST RP Workstation: [id number]      Vernal Alstrom, MD  [facility] 01/25/2025  If 7PM-7AM, please contact  night-coverage             "

## 2025-01-28 ENCOUNTER — Ambulatory Visit: Admitting: Physician Assistant
